# Patient Record
Sex: Female | Born: 1960 | Race: Black or African American | Hispanic: No | Marital: Married | Smoking: Former smoker
Health system: Southern US, Community
[De-identification: ages and names within clinical notes are randomized; demographics above are authoritative.]

## PROBLEM LIST (undated history)

## (undated) DIAGNOSIS — H409 Unspecified glaucoma: Secondary | ICD-10-CM

## (undated) DIAGNOSIS — R0602 Shortness of breath: Secondary | ICD-10-CM

## (undated) DIAGNOSIS — G2581 Restless legs syndrome: Secondary | ICD-10-CM

## (undated) DIAGNOSIS — M1990 Unspecified osteoarthritis, unspecified site: Secondary | ICD-10-CM

## (undated) DIAGNOSIS — M109 Gout, unspecified: Secondary | ICD-10-CM

## (undated) DIAGNOSIS — R51 Headache: Secondary | ICD-10-CM

## (undated) DIAGNOSIS — J449 Chronic obstructive pulmonary disease, unspecified: Secondary | ICD-10-CM

## (undated) DIAGNOSIS — F32A Depression, unspecified: Secondary | ICD-10-CM

## (undated) DIAGNOSIS — J189 Pneumonia, unspecified organism: Secondary | ICD-10-CM

## (undated) DIAGNOSIS — K219 Gastro-esophageal reflux disease without esophagitis: Secondary | ICD-10-CM

## (undated) DIAGNOSIS — G629 Polyneuropathy, unspecified: Secondary | ICD-10-CM

## (undated) DIAGNOSIS — K589 Irritable bowel syndrome without diarrhea: Secondary | ICD-10-CM

## (undated) DIAGNOSIS — I10 Essential (primary) hypertension: Secondary | ICD-10-CM

## (undated) DIAGNOSIS — F419 Anxiety disorder, unspecified: Secondary | ICD-10-CM

## (undated) DIAGNOSIS — G4730 Sleep apnea, unspecified: Secondary | ICD-10-CM

## (undated) DIAGNOSIS — T7840XA Allergy, unspecified, initial encounter: Secondary | ICD-10-CM

## (undated) DIAGNOSIS — T8482XA Fibrosis due to internal orthopedic prosthetic devices, implants and grafts, initial encounter: Secondary | ICD-10-CM

## (undated) DIAGNOSIS — F329 Major depressive disorder, single episode, unspecified: Secondary | ICD-10-CM

## (undated) DIAGNOSIS — E785 Hyperlipidemia, unspecified: Secondary | ICD-10-CM

## (undated) HISTORY — PX: BACK SURGERY: SHX140

## (undated) HISTORY — DX: Irritable bowel syndrome without diarrhea: K58.9

## (undated) HISTORY — PX: HERNIA REPAIR: SHX51

## (undated) HISTORY — PX: COLONOSCOPY: SHX174

## (undated) HISTORY — PX: KNEE ARTHROSCOPY: SUR90

## (undated) HISTORY — DX: Unspecified osteoarthritis, unspecified site: M19.90

## (undated) HISTORY — PX: OTHER SURGICAL HISTORY: SHX169

## (undated) HISTORY — PX: CHOLECYSTECTOMY: SHX55

## (undated) HISTORY — DX: Gastro-esophageal reflux disease without esophagitis: K21.9

## (undated) HISTORY — PX: TUBAL LIGATION: SHX77

## (undated) HISTORY — DX: Allergy, unspecified, initial encounter: T78.40XA

## (undated) HISTORY — DX: Essential (primary) hypertension: I10

## (undated) HISTORY — PX: EYE SURGERY: SHX253

---
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
## 1997-05-11 ENCOUNTER — Ambulatory Visit (HOSPITAL_COMMUNITY): Admission: RE | Admit: 1997-05-11 | Discharge: 1997-05-11 | Payer: Self-pay | Admitting: *Deleted

## 1997-11-07 ENCOUNTER — Inpatient Hospital Stay (HOSPITAL_COMMUNITY): Admission: AD | Admit: 1997-11-07 | Discharge: 1997-11-07 | Payer: Self-pay | Admitting: *Deleted

## 1998-12-11 ENCOUNTER — Ambulatory Visit (HOSPITAL_COMMUNITY): Admission: RE | Admit: 1998-12-11 | Discharge: 1998-12-11 | Payer: Self-pay | Admitting: Cardiology

## 1998-12-11 ENCOUNTER — Encounter: Payer: Self-pay | Admitting: Cardiology

## 1999-04-02 ENCOUNTER — Other Ambulatory Visit: Admission: RE | Admit: 1999-04-02 | Discharge: 1999-04-02 | Payer: Self-pay | Admitting: Obstetrics

## 1999-07-15 ENCOUNTER — Ambulatory Visit (HOSPITAL_COMMUNITY): Admission: RE | Admit: 1999-07-15 | Discharge: 1999-07-15 | Payer: Self-pay | Admitting: Gastroenterology

## 1999-07-15 ENCOUNTER — Encounter: Payer: Self-pay | Admitting: Gastroenterology

## 1999-12-02 ENCOUNTER — Encounter (INDEPENDENT_AMBULATORY_CARE_PROVIDER_SITE_OTHER): Payer: Self-pay

## 1999-12-02 ENCOUNTER — Ambulatory Visit (HOSPITAL_COMMUNITY): Admission: RE | Admit: 1999-12-02 | Discharge: 1999-12-02 | Payer: Self-pay | Admitting: Gastroenterology

## 1999-12-12 ENCOUNTER — Emergency Department (HOSPITAL_COMMUNITY): Admission: EM | Admit: 1999-12-12 | Discharge: 1999-12-12 | Payer: Self-pay | Admitting: Emergency Medicine

## 1999-12-12 ENCOUNTER — Encounter: Payer: Self-pay | Admitting: Emergency Medicine

## 1999-12-12 ENCOUNTER — Ambulatory Visit (HOSPITAL_COMMUNITY): Admission: RE | Admit: 1999-12-12 | Discharge: 1999-12-12 | Payer: Self-pay | Admitting: Cardiology

## 1999-12-12 ENCOUNTER — Encounter: Payer: Self-pay | Admitting: Cardiology

## 1999-12-13 ENCOUNTER — Emergency Department (HOSPITAL_COMMUNITY): Admission: EM | Admit: 1999-12-13 | Discharge: 1999-12-13 | Payer: Self-pay | Admitting: Emergency Medicine

## 1999-12-19 ENCOUNTER — Encounter: Admission: RE | Admit: 1999-12-19 | Discharge: 2000-01-02 | Payer: Self-pay | Admitting: Specialist

## 2000-01-05 ENCOUNTER — Ambulatory Visit (HOSPITAL_COMMUNITY): Admission: RE | Admit: 2000-01-05 | Discharge: 2000-01-05 | Payer: Self-pay | Admitting: Gastroenterology

## 2000-01-05 ENCOUNTER — Encounter: Payer: Self-pay | Admitting: Gastroenterology

## 2000-01-15 ENCOUNTER — Encounter: Admission: RE | Admit: 2000-01-15 | Discharge: 2000-01-15 | Payer: Self-pay | Admitting: Cardiology

## 2000-01-15 ENCOUNTER — Encounter: Payer: Self-pay | Admitting: Cardiology

## 2000-01-27 ENCOUNTER — Encounter: Payer: Self-pay | Admitting: Emergency Medicine

## 2000-01-27 ENCOUNTER — Emergency Department (HOSPITAL_COMMUNITY): Admission: EM | Admit: 2000-01-27 | Discharge: 2000-01-27 | Payer: Self-pay | Admitting: Emergency Medicine

## 2000-02-01 ENCOUNTER — Emergency Department (HOSPITAL_COMMUNITY): Admission: EM | Admit: 2000-02-01 | Discharge: 2000-02-02 | Payer: Self-pay | Admitting: Emergency Medicine

## 2000-02-01 ENCOUNTER — Encounter: Payer: Self-pay | Admitting: Emergency Medicine

## 2000-02-24 ENCOUNTER — Encounter: Admission: RE | Admit: 2000-02-24 | Discharge: 2000-02-24 | Payer: Self-pay | Admitting: Cardiology

## 2000-02-24 ENCOUNTER — Encounter: Payer: Self-pay | Admitting: Cardiology

## 2000-02-25 ENCOUNTER — Emergency Department (HOSPITAL_COMMUNITY): Admission: EM | Admit: 2000-02-25 | Discharge: 2000-02-26 | Payer: Self-pay | Admitting: Emergency Medicine

## 2000-02-26 ENCOUNTER — Encounter: Payer: Self-pay | Admitting: Emergency Medicine

## 2000-03-03 ENCOUNTER — Emergency Department (HOSPITAL_COMMUNITY): Admission: EM | Admit: 2000-03-03 | Discharge: 2000-03-03 | Payer: Self-pay | Admitting: Emergency Medicine

## 2000-03-18 ENCOUNTER — Encounter: Payer: Self-pay | Admitting: Emergency Medicine

## 2000-03-18 ENCOUNTER — Emergency Department (HOSPITAL_COMMUNITY): Admission: EM | Admit: 2000-03-18 | Discharge: 2000-03-18 | Payer: Self-pay | Admitting: Emergency Medicine

## 2000-03-20 ENCOUNTER — Encounter: Payer: Self-pay | Admitting: Neurology

## 2000-03-20 ENCOUNTER — Ambulatory Visit (HOSPITAL_COMMUNITY): Admission: RE | Admit: 2000-03-20 | Discharge: 2000-03-20 | Payer: Self-pay | Admitting: Neurology

## 2000-04-03 ENCOUNTER — Ambulatory Visit (HOSPITAL_COMMUNITY): Admission: RE | Admit: 2000-04-03 | Discharge: 2000-04-03 | Payer: Self-pay | Admitting: Neurology

## 2000-04-03 ENCOUNTER — Encounter: Payer: Self-pay | Admitting: Neurology

## 2000-06-02 ENCOUNTER — Emergency Department (HOSPITAL_COMMUNITY): Admission: EM | Admit: 2000-06-02 | Discharge: 2000-06-02 | Payer: Self-pay | Admitting: Emergency Medicine

## 2000-06-02 ENCOUNTER — Encounter: Payer: Self-pay | Admitting: Emergency Medicine

## 2000-06-14 ENCOUNTER — Encounter: Payer: Self-pay | Admitting: Emergency Medicine

## 2000-06-14 ENCOUNTER — Inpatient Hospital Stay (HOSPITAL_COMMUNITY): Admission: EM | Admit: 2000-06-14 | Discharge: 2000-06-17 | Payer: Self-pay | Admitting: Emergency Medicine

## 2000-06-15 ENCOUNTER — Encounter: Payer: Self-pay | Admitting: Cardiology

## 2000-06-17 ENCOUNTER — Encounter: Payer: Self-pay | Admitting: Cardiology

## 2000-06-22 ENCOUNTER — Emergency Department (HOSPITAL_COMMUNITY): Admission: EM | Admit: 2000-06-22 | Discharge: 2000-06-22 | Payer: Self-pay

## 2000-07-14 ENCOUNTER — Encounter: Payer: Self-pay | Admitting: Cardiology

## 2000-07-14 ENCOUNTER — Ambulatory Visit (HOSPITAL_COMMUNITY): Admission: RE | Admit: 2000-07-14 | Discharge: 2000-07-14 | Payer: Self-pay | Admitting: Cardiology

## 2000-08-10 ENCOUNTER — Emergency Department (HOSPITAL_COMMUNITY): Admission: EM | Admit: 2000-08-10 | Discharge: 2000-08-11 | Payer: Self-pay | Admitting: Emergency Medicine

## 2000-08-11 ENCOUNTER — Encounter: Payer: Self-pay | Admitting: Emergency Medicine

## 2000-08-13 ENCOUNTER — Encounter: Payer: Self-pay | Admitting: Emergency Medicine

## 2000-08-14 ENCOUNTER — Inpatient Hospital Stay (HOSPITAL_COMMUNITY): Admission: EM | Admit: 2000-08-14 | Discharge: 2000-08-16 | Payer: Self-pay | Admitting: Emergency Medicine

## 2000-08-15 ENCOUNTER — Encounter: Payer: Self-pay | Admitting: Pulmonary Disease

## 2000-08-23 ENCOUNTER — Encounter: Payer: Self-pay | Admitting: Emergency Medicine

## 2000-08-23 ENCOUNTER — Emergency Department (HOSPITAL_COMMUNITY): Admission: EM | Admit: 2000-08-23 | Discharge: 2000-08-23 | Payer: Self-pay | Admitting: Emergency Medicine

## 2000-08-24 ENCOUNTER — Encounter: Payer: Self-pay | Admitting: Emergency Medicine

## 2000-08-24 ENCOUNTER — Emergency Department (HOSPITAL_COMMUNITY): Admission: EM | Admit: 2000-08-24 | Discharge: 2000-08-24 | Payer: Self-pay | Admitting: Emergency Medicine

## 2000-08-29 ENCOUNTER — Emergency Department (HOSPITAL_COMMUNITY): Admission: EM | Admit: 2000-08-29 | Discharge: 2000-08-29 | Payer: Self-pay | Admitting: Emergency Medicine

## 2000-12-29 ENCOUNTER — Encounter: Payer: Self-pay | Admitting: Cardiology

## 2000-12-29 ENCOUNTER — Ambulatory Visit (HOSPITAL_COMMUNITY): Admission: RE | Admit: 2000-12-29 | Discharge: 2000-12-29 | Payer: Self-pay | Admitting: Cardiology

## 2001-10-08 ENCOUNTER — Emergency Department (HOSPITAL_COMMUNITY): Admission: EM | Admit: 2001-10-08 | Discharge: 2001-10-08 | Payer: Self-pay | Admitting: Emergency Medicine

## 2001-10-08 ENCOUNTER — Encounter: Payer: Self-pay | Admitting: Emergency Medicine

## 2002-05-01 ENCOUNTER — Emergency Department (HOSPITAL_COMMUNITY): Admission: EM | Admit: 2002-05-01 | Discharge: 2002-05-01 | Payer: Self-pay | Admitting: Emergency Medicine

## 2002-05-01 ENCOUNTER — Encounter: Payer: Self-pay | Admitting: Emergency Medicine

## 2003-02-08 ENCOUNTER — Encounter (HOSPITAL_COMMUNITY): Admission: RE | Admit: 2003-02-08 | Discharge: 2003-05-09 | Payer: Self-pay | Admitting: Cardiology

## 2003-06-08 ENCOUNTER — Emergency Department (HOSPITAL_COMMUNITY): Admission: EM | Admit: 2003-06-08 | Discharge: 2003-06-08 | Payer: Self-pay | Admitting: Emergency Medicine

## 2003-10-31 ENCOUNTER — Encounter: Admission: RE | Admit: 2003-10-31 | Discharge: 2003-10-31 | Payer: Self-pay | Admitting: Cardiology

## 2003-11-15 ENCOUNTER — Encounter: Admission: RE | Admit: 2003-11-15 | Discharge: 2003-11-15 | Payer: Self-pay | Admitting: Cardiology

## 2004-05-24 ENCOUNTER — Emergency Department (HOSPITAL_COMMUNITY): Admission: EM | Admit: 2004-05-24 | Discharge: 2004-05-24 | Payer: Self-pay | Admitting: Emergency Medicine

## 2005-05-12 ENCOUNTER — Encounter: Admission: RE | Admit: 2005-05-12 | Discharge: 2005-05-12 | Payer: Self-pay | Admitting: Pulmonary Disease

## 2006-03-23 ENCOUNTER — Encounter: Admission: RE | Admit: 2006-03-23 | Discharge: 2006-03-23 | Payer: Self-pay | Admitting: Cardiology

## 2006-06-21 ENCOUNTER — Ambulatory Visit (HOSPITAL_COMMUNITY): Admission: RE | Admit: 2006-06-21 | Discharge: 2006-06-21 | Payer: Self-pay | Admitting: Cardiology

## 2006-06-23 ENCOUNTER — Encounter: Admission: RE | Admit: 2006-06-23 | Discharge: 2006-06-23 | Payer: Self-pay | Admitting: Pulmonary Disease

## 2006-10-01 ENCOUNTER — Emergency Department (HOSPITAL_COMMUNITY): Admission: EM | Admit: 2006-10-01 | Discharge: 2006-10-01 | Payer: Self-pay | Admitting: Emergency Medicine

## 2007-04-06 ENCOUNTER — Ambulatory Visit (HOSPITAL_COMMUNITY): Admission: RE | Admit: 2007-04-06 | Discharge: 2007-04-06 | Payer: Self-pay | Admitting: Cardiology

## 2007-05-29 ENCOUNTER — Emergency Department (HOSPITAL_COMMUNITY): Admission: EM | Admit: 2007-05-29 | Discharge: 2007-05-29 | Payer: Self-pay | Admitting: Emergency Medicine

## 2007-06-29 ENCOUNTER — Encounter: Admission: RE | Admit: 2007-06-29 | Discharge: 2007-06-29 | Payer: Self-pay | Admitting: Cardiology

## 2007-08-25 ENCOUNTER — Ambulatory Visit (HOSPITAL_COMMUNITY): Admission: RE | Admit: 2007-08-25 | Discharge: 2007-08-25 | Payer: Self-pay | Admitting: Obstetrics

## 2007-09-28 ENCOUNTER — Encounter (INDEPENDENT_AMBULATORY_CARE_PROVIDER_SITE_OTHER): Payer: Self-pay | Admitting: Obstetrics

## 2007-09-28 ENCOUNTER — Ambulatory Visit (HOSPITAL_COMMUNITY): Admission: RE | Admit: 2007-09-28 | Discharge: 2007-09-28 | Payer: Self-pay | Admitting: Obstetrics

## 2008-01-26 ENCOUNTER — Emergency Department (HOSPITAL_COMMUNITY): Admission: EM | Admit: 2008-01-26 | Discharge: 2008-01-26 | Payer: Self-pay | Admitting: Emergency Medicine

## 2008-05-02 ENCOUNTER — Encounter: Admission: RE | Admit: 2008-05-02 | Discharge: 2008-05-02 | Payer: Self-pay | Admitting: Cardiology

## 2008-06-20 ENCOUNTER — Encounter: Admission: RE | Admit: 2008-06-20 | Discharge: 2008-06-20 | Payer: Self-pay | Admitting: Cardiology

## 2009-06-21 ENCOUNTER — Encounter: Admission: RE | Admit: 2009-06-21 | Discharge: 2009-06-21 | Payer: Self-pay | Admitting: Cardiology

## 2009-12-04 ENCOUNTER — Encounter: Admission: RE | Admit: 2009-12-04 | Discharge: 2009-12-04 | Payer: Self-pay | Admitting: Cardiology

## 2010-02-06 ENCOUNTER — Observation Stay (HOSPITAL_COMMUNITY)
Admission: EM | Admit: 2010-02-06 | Discharge: 2010-02-08 | Payer: Self-pay | Source: Home / Self Care | Attending: Internal Medicine | Admitting: Internal Medicine

## 2010-02-06 LAB — HEPATIC FUNCTION PANEL
ALT: 18 U/L (ref 0–35)
AST: 24 U/L (ref 0–37)
Albumin: 4.8 g/dL (ref 3.5–5.2)
Alkaline Phosphatase: 82 U/L (ref 39–117)
Bilirubin, Direct: 0.1 mg/dL (ref 0.0–0.3)
Indirect Bilirubin: 0.3 mg/dL (ref 0.3–0.9)
Total Bilirubin: 0.4 mg/dL (ref 0.3–1.2)
Total Protein: 8 g/dL (ref 6.0–8.3)

## 2010-02-06 LAB — POCT CARDIAC MARKERS
CKMB, poc: 2.2 ng/mL (ref 1.0–8.0)
Myoglobin, poc: 92.2 ng/mL (ref 12–200)
Troponin i, poc: <0.05 ng/mL (ref 0.00–0.09)

## 2010-02-06 LAB — DIFFERENTIAL
Basophils Absolute: 0 10*3/uL (ref 0.0–0.1)
Basophils Relative: 0 % (ref 0–1)
Eosinophils Absolute: 0.1 10*3/uL (ref 0.0–0.7)
Eosinophils Relative: 1 % (ref 0–5)
Lymphocytes Relative: 48 % — ABNORMAL HIGH (ref 12–46)
Lymphs Abs: 4.4 10*3/uL — ABNORMAL HIGH (ref 0.7–4.0)
Monocytes Absolute: 0.5 10*3/uL (ref 0.1–1.0)
Monocytes Relative: 6 % (ref 3–12)
Neutro Abs: 4.2 10*3/uL (ref 1.7–7.7)
Neutrophils Relative %: 46 % (ref 43–77)

## 2010-02-06 LAB — URINALYSIS, ROUTINE W REFLEX MICROSCOPIC
Bilirubin Urine: NEGATIVE
Hemoglobin, Urine: NEGATIVE
Ketones, ur: NEGATIVE mg/dL
Nitrite: NEGATIVE
Protein, ur: NEGATIVE mg/dL
Specific Gravity, Urine: 1.012 (ref 1.005–1.030)
Urine Glucose, Fasting: NEGATIVE mg/dL
Urobilinogen, UA: 0.2 mg/dL (ref 0.0–1.0)
pH: 6 (ref 5.0–8.0)

## 2010-02-06 LAB — BASIC METABOLIC PANEL
BUN: 6 mg/dL (ref 6–23)
CO2: 28 mEq/L (ref 19–32)
Calcium: 9.8 mg/dL (ref 8.4–10.5)
Chloride: 97 mEq/L (ref 96–112)
Creatinine, Ser: 0.91 mg/dL (ref 0.4–1.2)
GFR calc Af Amer: >60 mL/min (ref >60)
GFR calc non Af Amer: >60 mL/min (ref >60)
Glucose, Bld: 114 mg/dL — ABNORMAL HIGH (ref 70–99)
Potassium: 3.7 mEq/L (ref 3.5–5.1)
Sodium: 134 mEq/L — ABNORMAL LOW (ref 135–145)

## 2010-02-06 LAB — CBC
HCT: 40.2 % (ref 36.0–46.0)
Hemoglobin: 14.1 g/dL (ref 12.0–15.0)
MCH: 28 pg (ref 26.0–34.0)
MCHC: 35.1 g/dL (ref 30.0–36.0)
MCV: 79.9 fL (ref 78.0–100.0)
Platelets: 267 10*3/uL (ref 150–400)
RBC: 5.03 MIL/uL (ref 3.87–5.11)
RDW: 14.3 % (ref 11.5–15.5)
WBC: 9.1 10*3/uL (ref 4.0–10.5)

## 2010-02-06 LAB — LIPASE, BLOOD: Lipase: 24 U/L (ref 11–59)

## 2010-02-07 LAB — COMPREHENSIVE METABOLIC PANEL
ALT: 19 U/L (ref 0–35)
ALT: 17 U/L (ref 0–35)
AST: 24 U/L (ref 0–37)
AST: 27 U/L (ref 0–37)
Albumin: 4.3 g/dL (ref 3.5–5.2)
Albumin: 4.2 g/dL (ref 3.5–5.2)
Alkaline Phosphatase: 82 U/L (ref 39–117)
Alkaline Phosphatase: 73 U/L (ref 39–117)
BUN: 6 mg/dL (ref 6–23)
BUN: 6 mg/dL (ref 6–23)
CO2: 25 mEq/L (ref 19–32)
CO2: 26 mEq/L (ref 19–32)
Calcium: 9.3 mg/dL (ref 8.4–10.5)
Calcium: 9 mg/dL (ref 8.4–10.5)
Chloride: 96 mEq/L (ref 96–112)
Chloride: 98 mEq/L (ref 96–112)
Creatinine, Ser: 0.94 mg/dL (ref 0.4–1.2)
Creatinine, Ser: 0.96 mg/dL (ref 0.4–1.2)
GFR calc Af Amer: >60 mL/min (ref >60)
GFR calc Af Amer: >60 mL/min (ref >60)
GFR calc non Af Amer: >60 mL/min (ref >60)
GFR calc non Af Amer: >60 mL/min (ref >60)
Glucose, Bld: 134 mg/dL — ABNORMAL HIGH (ref 70–99)
Glucose, Bld: 154 mg/dL — ABNORMAL HIGH (ref 70–99)
Potassium: 3.6 mEq/L (ref 3.5–5.1)
Potassium: 3.7 mEq/L (ref 3.5–5.1)
Sodium: 132 mEq/L — ABNORMAL LOW (ref 135–145)
Sodium: 133 mEq/L — ABNORMAL LOW (ref 135–145)
Total Bilirubin: 0.6 mg/dL (ref 0.3–1.2)
Total Bilirubin: 0.5 mg/dL (ref 0.3–1.2)
Total Protein: 7.5 g/dL (ref 6.0–8.3)
Total Protein: 6.5 g/dL (ref 6.0–8.3)

## 2010-02-07 LAB — LIPID PANEL
Cholesterol: 143 mg/dL (ref 0–200)
HDL: 43 mg/dL (ref >39)
LDL Cholesterol: 83 mg/dL (ref 0–99)
Total CHOL/HDL Ratio: 3.3 RATIO
Triglycerides: 87 mg/dL (ref <150)
VLDL: 17 mg/dL (ref 0–40)

## 2010-02-07 LAB — HEMOGLOBIN A1C
Hgb A1c MFr Bld: 6.9 % — ABNORMAL HIGH (ref <5.7)
Mean Plasma Glucose: 151 mg/dL — ABNORMAL HIGH (ref <117)

## 2010-02-07 LAB — TSH: TSH: 4.011 u[IU]/mL (ref 0.350–4.500)

## 2010-02-07 LAB — CARDIAC PANEL(CRET KIN+CKTOT+MB+TROPI)
CK, MB: 3 ng/mL (ref 0.3–4.0)
Relative Index: 0.9 (ref 0.0–2.5)
Total CK: 331 U/L — ABNORMAL HIGH (ref 7–177)
Troponin I: 0.02 ng/mL (ref 0.00–0.06)

## 2010-02-07 LAB — CK TOTAL AND CKMB (NOT AT ARMC)
CK, MB: 3 ng/mL (ref 0.3–4.0)
Relative Index: 0.8 (ref 0.0–2.5)
Total CK: 367 U/L — ABNORMAL HIGH (ref 7–177)

## 2010-02-07 LAB — GLUCOSE, CAPILLARY: Glucose-Capillary: 126 mg/dL — ABNORMAL HIGH (ref 70–99)

## 2010-02-07 LAB — TROPONIN I: Troponin I: 0.01 ng/mL (ref 0.00–0.06)

## 2010-02-07 LAB — PHOSPHORUS: Phosphorus: 3.7 mg/dL (ref 2.3–4.6)

## 2010-02-07 LAB — MAGNESIUM: Magnesium: 2 mg/dL (ref 1.5–2.5)

## 2010-02-14 ENCOUNTER — Encounter (HOSPITAL_COMMUNITY): Admission: RE | Admit: 2010-02-14 | Payer: Self-pay | Source: Home / Self Care | Admitting: Cardiology

## 2010-02-17 LAB — CARDIAC PANEL(CRET KIN+CKTOT+MB+TROPI)
CK, MB: 3.4 ng/mL (ref 0.3–4.0)
Relative Index: 0.9 (ref 0.0–2.5)
Total CK: 392 U/L — ABNORMAL HIGH (ref 7–177)
Troponin I: 0.01 ng/mL (ref 0.00–0.06)

## 2010-02-17 LAB — GLUCOSE, CAPILLARY
Glucose-Capillary: 96 mg/dL (ref 70–99)
Glucose-Capillary: 93 mg/dL (ref 70–99)
Glucose-Capillary: 112 mg/dL — ABNORMAL HIGH (ref 70–99)

## 2010-02-21 NOTE — Discharge Summary (Signed)
  NAMEAHNIKA, HANNIBAL              ACCOUNT NO.:  [id number]  MEDICAL RECORD NO.:  [id number]           PATIENT TYPE:  LOCATION:                                 FACILITY:  PHYSICIAN:  Pincus Large, MD     DATE OF BIRTH:  1960/06/13  DATE OF ADMISSION: DATE OF DISCHARGE:                              DISCHARGE SUMMARY   PRIMARY CARE PHYSICIAN:  Osvaldo Shipper. Spruill, MD  REASON FOR ADMISSION:  Epigastric pain.  HISTORY OF PRESENT ILLNESS:  She is a 50 year old female with past medical history of hypertension, recently diagnosed with diabetes.  She came in because she was sitting on the chair and she had some epigastric right upper quadrant pain radiating to her back with no nausea or vomiting.  The patient came to the emergency room.  Her ultrasound of the abdomen was unremarkable.  The patient also was admitted to rule out acute coronary syndrome.  Her cardiac enzymes x3 were negative.  The patient did feel better with GI cocktail and Protonix.  Her liver function was remarkable.  On the lab, it was found that her A1c was 6.9, so we had diabetic education and dietary consult and we started the patient on metformin 500 p.o. b.i.d.  The patient was sent home on metformin and continue with home medications.  DISCHARGE DIAGNOSES: 1. Gastritis. 2. New onset diabetes type 2. 3. Chest pain, myocardial infarction was ruled out. 4. Hyperglycemia. 5. Chronic obstructive pulmonary disease.  DISCHARGE MEDICATIONS: 1. Protonix 40 daily. 2. Metformin 500 p.o. b.i.d. 3. Advair 500/50 one puff p.o. b.i.d. 4. Combivent inhaler 2 puffs q.4 p.r.n. 5. Lisinopril 40 daily. 6. Naproxen 500 mg q.8 p.r.n. 7. Simvastatin 20 daily.  Radiology that was done here:  Ultrasound of the abdomen shows status post cholecystectomy, otherwise unremarkable.  Chest x-ray shows no acute cardiopulmonary abnormality.  LABORATORY DATA:  On admission, WBC was 9.1, hemoglobin was 14.1, and platelets 267.  Her  sodium was 134, potassium was 3.7, chloride 97, bicarb 28, and glucose 114.  Her A1c was 6.9.  Her fasting lipid profile:  Cholesterol is 143, LDL is 83, and HDL is 43.  Her TSH was 4.01, and her urinalysis was normal.  DISCHARGE VITAL SIGNS:  Temperature is 98, pulse 63, aspirations 20, and blood pressure is 107/67.  DISPOSITION:  Home.  DIET:  Two-gram sodium, 1800 ADA diet.          ______________________________ Pincus Large, MD     SA/MEDQ  D:  02/08/2010  T:  02/09/2010  Job:  [id number]  Electronically Signed by Pincus Large MD on 02/21/2010 11:49:20 AM

## 2010-03-01 NOTE — H&P (Signed)
NAMEKENNEDE, LUSK              ACCOUNT NO.:  [id number]  MEDICAL RECORD NO.:  [id number]           PATIENT TYPE:  LOCATION:                                 FACILITY:  PHYSICIAN:  Michiel Cowboy, MDDATE OF BIRTH:  07/19/1960  DATE OF ADMISSION:  02/07/2010 DATE OF DISCHARGE:                             HISTORY & PHYSICAL   PRIMARY CARE PROVIDER:  The patient identifies her primary care provider is Dr. Shana Chute.  She states that she only sees Dr. Petra Kuba for her asthma, but Dr. Shana Chute states that Dr. Petra Kuba is her primary care provider.  CHIEF COMPLAINT:  Epigastric/chest pain.  HISTORY OF PRESENT ILLNESS:  The patient is a 50 year old female with past history of hypertension, recently diagnosed diabetes by Dr. Shana Chute, and history of asthma, well controlled.  The patient was sitting in her chair after eating breakfast and developed sudden epigastric/right and left upper quadrant discomfort, radiating to her back and may be under her breasts.  She did not have any shortness of breath.  No nausea, no vomiting.  No constipation.  No fevers, no chills.  This has been prolonged for a few hours and eventually she presents to the emergency department.  In the ED, this have now completely resolved.  She has had an ultrasound done which was unremarkable.  Her LFTs were unremarkable.  She is status post cholecystectomy, at which point, [facility] was called to evaluate.  The patient does endorse history of GERD and also history of arthritis.  Otherwise, review of systems is negative.  PAST MEDICAL HISTORY:  Significant for: 1. History of asthma. 2. Newly diagnosed diabetes, not on medications. 3. Hypertension.  SOCIAL HISTORY:  The patient does not smoke or drink or abuse drugs. She used to smoke in remote past.  FAMILY HISTORY:  Noncontributory.  ALLERGIES:  ASPIRIN causes her stomach upset.  MEDICATIONS: 1. Naprosyn 500 mg for back pain. 2.  Ranitidine 150 mg b.i.d. 3. Advair Diskus twice a day. 4. Hydrochlorothiazide 12.5 mg daily. 5. Premarin 0.625 mg daily. 6. Combivent. 7. Flonase. 8. Simvastatin 20 mg daily. 9. Lisinopril 40 mg daily. 10.Albuterol as needed.  PHYSICAL EXAMINATION:  VITAL SIGNS:  Temperature 98.6, blood pressure 138/98, pulse 98, respirations 21, satting 98% on room air. GENERAL:  The patient appears to be in no acute distress. HEENT:  Nontraumatic.  Moist mucous membranes. LUNGS:  Clear to auscultation bilaterally. HEART:  Regular rate and rhythm.  No murmurs appreciated. ABDOMEN:  Soft, nontender, nondistended.  Negative Murphy sign. EXTREMITIES:  Lower extremities without clubbing, cyanosis, or edema. NEUROLOGIC:  Grossly intact.  LABORATORY DATA:  White blood cell count 9.1, hemoglobin 14.1.  Sodium 134, potassium 3.7, creatinine 0.91.  LFTs within normal limits.  Lipase 24.  UA unremarkable.  EKG showing normal sinus rhythm.  No evidence of ischemia.  Abdominal ultrasound also within normal limits.  No chest x- ray was done because pain was initially described more as an epigastric discomfort.  ASSESSMENT AND PLAN: 1. This is a 50 year old female.  She does have history of recently     diagnosed diabetes and hypertension.  The pain was  of unclear     etiology and very atypical, but given her history, we will admit     for further evaluation.  She was not completely clear if it was     just epigastric or there is some radiation to the chest.  We will     admit, we will cycle cardiac enzymes, check fasting lipid panel,     hemoglobin A1c.  The patient would probably benefit from diabetes     education.  We will do serial EKGs. 2. Hypertension.  Continue lisinopril. 3. Prophylaxis.  We will write for Protonix and SCDs. 4. Epigastric pain, etiology unclear.  May be gastric in nature.  If     continues, consider attempting to give Carafate or GI cocktail to     make sure she is on Protonix.   She is currently chest pain and     epigastric pain free.     Michiel Cowboy, MD     AVD/MEDQ  D:  02/07/2010  T:  02/07/2010  Job:  [id number]  cc:   Mina Marble, M.D.  Electronically Signed by Therisa Doyne MD on 03/01/2010 06:16:08 AM

## 2010-03-05 ENCOUNTER — Encounter: Payer: Self-pay | Admitting: Cardiology

## 2010-03-24 ENCOUNTER — Encounter: Payer: Medicaid Other | Admitting: Dietician

## 2010-03-24 ENCOUNTER — Encounter: Payer: Medicaid Other | Attending: Pulmonary Disease

## 2010-03-24 DIAGNOSIS — Z713 Dietary counseling and surveillance: Secondary | ICD-10-CM | POA: Insufficient documentation

## 2010-03-24 DIAGNOSIS — IMO0001 Reserved for inherently not codable concepts without codable children: Secondary | ICD-10-CM | POA: Insufficient documentation

## 2010-04-01 ENCOUNTER — Other Ambulatory Visit (HOSPITAL_COMMUNITY): Payer: Self-pay | Admitting: Obstetrics

## 2010-04-01 DIAGNOSIS — N950 Postmenopausal bleeding: Secondary | ICD-10-CM

## 2010-04-11 ENCOUNTER — Ambulatory Visit (HOSPITAL_COMMUNITY)
Admission: RE | Admit: 2010-04-11 | Discharge: 2010-04-11 | Disposition: A | Discharge status: Home / Self Care | Payer: Medicaid Other | Source: Ambulatory Visit | Attending: Obstetrics | Admitting: Obstetrics

## 2010-04-11 DIAGNOSIS — N950 Postmenopausal bleeding: Secondary | ICD-10-CM | POA: Insufficient documentation

## 2010-04-11 DIAGNOSIS — N83209 Unspecified ovarian cyst, unspecified side: Secondary | ICD-10-CM | POA: Insufficient documentation

## 2010-04-16 ENCOUNTER — Other Ambulatory Visit (HOSPITAL_COMMUNITY): Payer: Self-pay | Admitting: Obstetrics

## 2010-04-16 DIAGNOSIS — R58 Hemorrhage, not elsewhere classified: Secondary | ICD-10-CM

## 2010-04-17 ENCOUNTER — Encounter: Payer: Medicaid Other | Attending: Pulmonary Disease

## 2010-04-17 DIAGNOSIS — Z713 Dietary counseling and surveillance: Secondary | ICD-10-CM | POA: Insufficient documentation

## 2010-04-17 DIAGNOSIS — IMO0001 Reserved for inherently not codable concepts without codable children: Secondary | ICD-10-CM | POA: Insufficient documentation

## 2010-04-18 ENCOUNTER — Ambulatory Visit (HOSPITAL_COMMUNITY): Admission: RE | Admit: 2010-04-18 | Payer: Medicaid Other | Source: Ambulatory Visit

## 2010-05-16 ENCOUNTER — Encounter (HOSPITAL_COMMUNITY)
Admission: RE | Admit: 2010-05-16 | Discharge: 2010-05-16 | Disposition: A | Discharge status: Home / Self Care | Payer: Medicaid Other | Source: Ambulatory Visit | Attending: Obstetrics | Admitting: Obstetrics

## 2010-05-16 LAB — BASIC METABOLIC PANEL
BUN: 9 mg/dL (ref 6–23)
CO2: 28 mEq/L (ref 19–32)
Calcium: 9.2 mg/dL (ref 8.4–10.5)
Chloride: 106 mEq/L (ref 96–112)
Creatinine, Ser: 0.8 mg/dL (ref 0.4–1.2)
GFR calc Af Amer: >60 mL/min (ref >60)
GFR calc non Af Amer: >60 mL/min (ref >60)
Glucose, Bld: 98 mg/dL (ref 70–99)
Potassium: 3.7 mEq/L (ref 3.5–5.1)
Sodium: 141 mEq/L (ref 135–145)

## 2010-05-16 LAB — CBC
HCT: 35.1 % — ABNORMAL LOW (ref 36.0–46.0)
Hemoglobin: 11.6 g/dL — ABNORMAL LOW (ref 12.0–15.0)
MCH: 27.6 pg (ref 26.0–34.0)
MCHC: 33 g/dL (ref 30.0–36.0)
MCV: 83.4 fL (ref 78.0–100.0)
Platelets: 252 10*3/uL (ref 150–400)
RBC: 4.21 MIL/uL (ref 3.87–5.11)
RDW: 14.7 % (ref 11.5–15.5)
WBC: 6.8 10*3/uL (ref 4.0–10.5)

## 2010-05-20 ENCOUNTER — Other Ambulatory Visit: Payer: Self-pay | Admitting: Pulmonary Disease

## 2010-05-20 DIAGNOSIS — Z1231 Encounter for screening mammogram for malignant neoplasm of breast: Secondary | ICD-10-CM

## 2010-05-28 ENCOUNTER — Ambulatory Visit (HOSPITAL_COMMUNITY)
Admission: RE | Admit: 2010-05-28 | Discharge: 2010-05-29 | Disposition: A | Discharge status: Home / Self Care | Payer: Medicaid Other | Source: Ambulatory Visit | Attending: Obstetrics | Admitting: Obstetrics

## 2010-05-28 ENCOUNTER — Other Ambulatory Visit: Payer: Self-pay | Admitting: Obstetrics

## 2010-05-28 DIAGNOSIS — N938 Other specified abnormal uterine and vaginal bleeding: Secondary | ICD-10-CM | POA: Insufficient documentation

## 2010-05-28 DIAGNOSIS — Z01812 Encounter for preprocedural laboratory examination: Secondary | ICD-10-CM | POA: Insufficient documentation

## 2010-05-28 DIAGNOSIS — N949 Unspecified condition associated with female genital organs and menstrual cycle: Secondary | ICD-10-CM | POA: Insufficient documentation

## 2010-05-28 DIAGNOSIS — Z01818 Encounter for other preprocedural examination: Secondary | ICD-10-CM | POA: Insufficient documentation

## 2010-05-28 LAB — GLUCOSE, CAPILLARY
Glucose-Capillary: 89 mg/dL (ref 70–99)
Glucose-Capillary: 126 mg/dL — ABNORMAL HIGH (ref 70–99)
Glucose-Capillary: 142 mg/dL — ABNORMAL HIGH (ref 70–99)

## 2010-05-29 LAB — CBC
HCT: 33.7 % — ABNORMAL LOW (ref 36.0–46.0)
Hemoglobin: 11.2 g/dL — ABNORMAL LOW (ref 12.0–15.0)
MCH: 27.5 pg (ref 26.0–34.0)
MCHC: 33.2 g/dL (ref 30.0–36.0)
MCV: 82.8 fL (ref 78.0–100.0)
Platelets: 213 10*3/uL (ref 150–400)
RBC: 4.07 MIL/uL (ref 3.87–5.11)
RDW: 14 % (ref 11.5–15.5)
WBC: 8.1 10*3/uL (ref 4.0–10.5)

## 2010-06-04 NOTE — Discharge Summary (Signed)
  [name], Stacy [name]              ACCOUNT NO.:  [id number]  MEDICAL RECORD NO.:  [id number]          PATIENT TYPE:  LOCATION:                                 FACILITY:  PHYSICIAN:  Kathreen Cosier, M.D.DATE OF BIRTH:  July 13, 1960  DATE OF ADMISSION: DATE OF DISCHARGE:  05/29/2010                              DISCHARGE SUMMARY   The patient is a 50 year old gravida 2, para 2-0-0-2, who was admitted with abnormal bleeding and had an endometrial biopsy 2 weeks ago, which was normal.  She was in for hysteroscopy, D and C, HTA on April 25. During the procedure after the pressures could not be equalize with HTA. The device was removed, and on insertion of a second device, the uterus was perforated and the patient was brought in for observation. Overnight, she did well.  Her abdomen was soft, nontender.  She had minimal vaginal bleeding and was discharged on May 29, 2010.  Her medications were unchanged.  She was discharged on Premarin, Protonix, metformin, fluticasone nasal spray, Combivent, Advair, hydrochlorothiazide, lisinopril, naproxen, Proventil, ranitidine, and simvastatin as she had been taking at home previously without any change in the dosage of her medication.  She is to see me in 4 weeks.  DISCHARGE DIAGNOSIS:  Status post hysteroscopy, dilation and curettage, and perforation of the uterine fundus.          ______________________________ Kathreen Cosier, M.D.     BAM/MEDQ  D:  05/29/2010  T:  05/29/2010  Job:  [id number]  Electronically Signed by Francoise Ceo M.D. on 06/04/2010 08:13:49 AM

## 2010-06-04 NOTE — Op Note (Signed)
  Stacy [name], KUMAGAI              ACCOUNT NO.:  [id number]  MEDICAL RECORD NO.:  [id number]           PATIENT TYPE:  O  LOCATION:  9316                          FACILITY:  [facility]  PHYSICIAN:  Kathreen Cosier, M.D.DATE OF BIRTH:  12-21-1960  DATE OF PROCEDURE:  05/28/2010 DATE OF DISCHARGE:                              OPERATIVE REPORT   SURGEON:  Kathreen Cosier, MD  PREOPERATIVE DIAGNOSIS:  Dysfunctional bleeding.  POSTOPERATIVE DIAGNOSIS:  Dysfunctional bleeding.  PROCEDURE:  Hysteroscopy, D and C, and attempted ThermaChoice ablation, which was not done.  Under general anesthesia, the patient was in lithotomy position. Perineum and vagina were prepped and draped.  Bladder was emptied with straight catheter.  Bimanual exam revealed uterus to be normal size, negative adnexa.  Speculum was placed in the vagina.  Cervix was injected with 10 mL of 1% Xylocaine.  Anterior lip of the cervix was grasped with tenaculum.  The endometrial cavity sounded 10 cm.  Cervix was dilated with #25 Shawnie Pons and the hysteroscope inserted.  It was noted that there was a myoma in the cavity and the fundus could not be visualized; however, there was an adequate space between the fibroid and the wall of the uterus to sound the uterus.  The hysteroscope was removed and sharp curettage was performed.  Moderate amount of tissue was obtained.  The ThermaChoice device was then inserted to a depth of 10 cm, which the cavity had measured before; however, the pressure could not be stabilized after multiple attempts.  So, the device was removed and a new device was inserted.  The new device did not stop at 10 cm meaning that the fundus was perforated, so all procedures were terminated at that point and the patient was admitted for observation.          ______________________________ Kathreen Cosier, M.D.     BAM/MEDQ  D:  05/28/2010  T:  05/29/2010  Job:  [id number]  Electronically Signed by  Francoise Ceo M.D. on 06/04/2010 08:13:47 AM

## 2010-06-17 NOTE — Op Note (Signed)
[name], Stacy [name]              ACCOUNT NO.:  [id number]   MEDICAL RECORD NO.:  [id number]          PATIENT TYPE:  AMB   LOCATION:  SDC                           FACILITY:  [facility]   PHYSICIAN:  Kathreen Cosier, M.D.DATE OF BIRTH:  [birth year]   DATE OF PROCEDURE:  09/28/2007  DATE OF DISCHARGE:                               OPERATIVE REPORT   PREOPERATIVE DIAGNOSIS:  Dysfunctional uterine bleeding.   POSTOPERATIVE DIAGNOSIS:  Dysfunctional uterine bleeding.   PROCEDURE:  Hysteroscopy and dilatation and curettage.   Under general anesthesia,  the patient in lithotomy position.  Perineum  and vagina were prepped and draped.  Bladder emptied with a straight  catheter.  Bimanual exam revealed the uterus to be enlarged with myomas.  Speculum was placed in the vagina.  Cervix was injected with 10 mL of 1%  Xylocaine.  Anterior lip of the cervix grasped with a tenaculum and the  cervix curetted and small amount of tissue obtained.  Endometrial cavity  sounded at 9 cm.  Cervix was dilated with #27 Shawnie Pons, and then the  hysteroscope inserted.  The cavity was normal except for a polyp at 6  o'clock.  The hysteroscope was removed and sharp curettage performed.  A  large amount of tissue was obtained.  Forceps inserted and polyp  removed.   The patient tolerated the procedure well and was taken to the recovery  room in good condition.           ______________________________  Kathreen Cosier, M.D.     BAM/MEDQ  D:  09/28/2007  T:  09/28/2007  Job:  [id number]

## 2010-06-20 NOTE — Procedures (Signed)
[facility]  Patient:    Stacy [name], Stacy [name]                       MRN: [id number] Proc. Date: 12/02/99 Adm. Date:  [id number] Attending:  Louie Bun CC:         Osvaldo Shipper. Spruill, M.D.   Procedure Report  PROCEDURE:  Esophagogastroduodenoscopy with biopsy.  SURGEON:  John C. Madilyn Fireman, M.D.  INDICATIONS FOR PROCEDURE:  Chronic abdominal pain, extensively worked up in the past, but without recent study of her upper GI tract, and recent failure to respond to Nexium.  She has also had diarrhea and the procedure is with the intent of obtaining small bowel biopsies to rule out a sprue-like lesion as well as to assess for the presence of acid peptic disease.  DESCRIPTION OF PROCEDURE:  The patient was placed in the left lateral decubitus position and placed on the pulse monitor with continuous low flow oxygen delivered by nasal cannula.  She was sedated with 100 mg of IV Demerol and 10 mg of IV Versed.  The Olympus video endoscope was advanced under direct vision into the oropharynx and esophagus.  The esophagus was straight and of normal caliber at the squamocolumnar line at 38 cm.  There was no visible esophagitis, ring, stricture, or other abnormalities of the GE junction.  The stomach was entered and a small amount of liquid secretions were suctioned from the fundus.  Retroflexed view of the cardia was unremarkable.  The fundus and body appeared normal.  The antrum showed two or three patchy erosions without exudate, as well as some general erythema and mild granularity distally consistent with a mild antral gastritis.  The pylorus was ______ and appeared patent, but it was difficult to pass the scope through it requiring the scope to be pushed to its full insertion length, possibly consistent with an elongated or J-shaped stomach.  Once the duodenum was entered, the bulb and second portion appeared to be within normal limits.  Small bowel biopsies  were taken to rule out celiac disease or other forms of disease of the intestinal villi.  The scope was then withdrawn and the patient returned to the recovery room in stable condition.  She tolerated the procedure well and there were no immediate complications.  IMPRESSION:  Mild antral gastritis, otherwise normal endoscopy.  PLAN:  Await CLOtest and small bowel biopsies. DD:  12/02/99 TD:  12/02/99 Job: [id number]

## 2010-06-20 NOTE — Discharge Summary (Signed)
[place]. [facility]  Patient:    Stacy [name], Stacy [name] Visit Number: [id number] MRN: [id number]          Service Type: MED Location: 3W 0365 01 Attending Physician:  Robynn Pane Admit Date:  06/14/2000 Discharge Date: 06/17/2000   CC:         Osvaldo Shipper. Spruill, M.D.                           Discharge Summary  ADMISSION DIAGNOSES: 1. Atypical chest pain, rule out coronary insufficiency. 2. Exacerbation of asthmatic bronchitis. 3. History of tobacco abuse. 4. History of gastritis. 5. History of irritable bowel syndrome.  FINAL DIAGNOSES: 1. Status post exacerbation of asthmatic bronchitis. 2. Status post atypical chest pain. 3. History of tobacco abuse. 4. History of irritable bowel syndrome. 5. Chronic constipation. 6. Chronic gastritis.  DISCHARGE HOME MEDICATIONS: 1. Tequin 400 mg 1 tablet daily for five more days. 2. Nexium 40 mg 1 capsule daily. 3. Colace 100 mg 1 daily as needed. 4. Proventil inhaler 2 puffs twice daily. 5. Atrovent inhaler 2 puffs twice daily. 6. Baby aspirin 81 mg 2 tablets daily. 7. Cardizem CD 120 mg 1 capsule daily.  ACTIVITY:  As tolerated.  DIET:  Low salt, low cholesterol.  DISCHARGE INSTRUCTIONS:  The patient has been advised to stop smoking.  FOLLOW-UP:  With Dr. Shana Chute in two weeks, and GI as scheduled.  CONDITION ON DISCHARGE:  Stable.  HISTORY OF PRESENT ILLNESS:  Stacy [name] is a 50 year old black female with past medical history significant for bronchial asthma, irritable bowel syndrome, history of gastritis, came to ER via EMS complaining of pleuritic chest pain associated with cough and shortness of breath off and on since 1 p.m. this afternoon.  The pain got worse this evening so decided to call EMS.  Denies fevers, chills, rigors.  States was seen in ER at [facility] on Jun 02, 2000, with similar complaints and was treated with steroids and antibiotics and was discharged home with some  improvement.  She states she feels weak and tired with minimal exertion.  Denies any PND, orthopnea, leg swelling.  Denies palpitations, lightheadedness, or syncope.  PAST MEDICAL HISTORY:  As above.  PAST SURGICAL HISTORY: 1. Cholecystectomy many years ago. 2. Ventral hernia repair.  SOCIAL HISTORY:  She is single, has two children.  Smoked one-half pack per day for 20+ years.  States has not smoked for the last two weeks.  No history of alcohol abuse or drug abuse.  Did housework.  Is on disability at present.  FAMILY HISTORY:  One sister died of massive MI in her 19s.  One brother hypertensive.  Father and mother, she does not know about her parents.  MEDICATIONS: 1. Valium 5 mg t.i.d. 2. Neurontin. 3. Celebrex. 4. Nexium. 5. Proventil and Atrovent inhalers.  PHYSICAL EXAMINATION:  GENERAL:  The patient is alert, awake, oriented x 3.  Complaining of mild pleuritic and chest wall pain.  VITAL SIGNS:  Blood pressure 130/70, pulse was 96.  HEENT:  Conjunctivae pink.  NECK:  Supple.  No JVD, no bruit.  No lymph nodes.  LUNGS:  Bilateral rhonchi with no wheezing, no rub.  CARDIOVASCULAR:  S1, S2 normal.  There was no S3, gallop.  There was no rub. There was soft systolic murmur at the apex.  ABDOMEN:  Soft.  Bowel sounds are present.  Distended.  There was no mass or organomegaly.  Nontender.  EXTREMITIES:  There was no clubbing, cyanosis, or edema.  LABORATORY DATA:  EKG done in the ER showed sinus tachycardia.  Left atrial enlargement with heart rate of 125.  No acute ischemic changes were noted. Repeat EKG done on Jun 15, 2000, showed normal sinus rhythm with heart rate of 77.  Chest x-ray showed accentuated basilar markings.  There was no evidence of pneumonia.  Heart and vascularity remain normal.  KUB showed persistent retained feces throughout the colon but improvement from prior KUB which was done on Jun 14, 2000.  Cholesterol was 194, LDL of 105, HDL of  61.  CPK first set was 406, MB of 4.9, with relative index of 1.2.  Second set CPK 258, MB of 1.9, relative index 0.7.  Third set 210 CPK, MB of 2.4, relative index 1.1.  Three sets of troponin I were 0.05, 0.04, 0.01.  Essentially her cardiac enzymes were negative.  Magnesium was 2.2, sodium 139, potassium 3.6, chloride 103, bicarbonate 28.  Blood glucose was 172, repeat fasting sugar was 97.  BUN was 5, creatinine 0.8.  Hemoglobin was 12.2, hematocrit 35.8, white count of 12.4. Repeat hemoglobin on Jun 16, 2000, was 12, hemoglobin 35.3, white count has come down to 8.4, with no shift to the left.  HOSPITAL COURSE:  The patient was admitted to telemetry unit.  MI was ruled out by serial enzymes and EKG.  The patient did not have any episodes of further chest pain.  The patient was started on IV Tequin, which was switched to p.o.  The patient remained afebrile during the hospital stay.  The patient was continued on Atrovent and Proventil nebulizers.  The patient remained afebrile.  Had a good bowel movement on Jun 15, 2000, and has received Fleet enema today again for chronic constipation.  The patient has been advised to stop her Valium and continue on Colace as needed.  The patient has been advised to follow up with GI as scheduled.  Follow up with Dr. Shana Chute in two weeks. Attending Physician:  Robynn Pane DD:  06/17/00 TD:  06/19/00 Job: [id number]

## 2010-06-20 NOTE — Discharge Summary (Signed)
[place]. [facility]  Patient:    Stacy [name], Stacy [name] Visit Number: [id number] MRN: [id number]          Service Type: EMS Location: MINO Attending Physician:  Lorre Nick Dictated by:   Eino Farber., M.D. Adm. Date:  08/29/2000 Disc. Date: 08/29/2000                             Discharge Summary  DISCHARGE DIAGNOSES: 1. Chronic obstructive pulmonary disease with status asthmaticus. 2. Pneumonia, nonspecific. 3. Subsegmental atelectasis with pulmonary collapse at the bases. 4. Gastroesophageal reflux disease. 5. Hypokalemia. 6. Esophageal dyskinesis. 7. Sprain of left knee. 8. Fall, nonspecific, with straining of the right knee with the accident to    being within institution.  BRIEF HISTORY:  The patient is a 50 year old black female with a known history of asthma, who was admitted from the emergency department with uncontrolled status asthmaticus with a persistent tachycardia.  The patient takes inhaled steroids, as well as albuterol nebulizers without control of her asthma.  IV Solu-Medrol steroid was given in the emergency room, still without control o f her asthmatic condition and she was admitted for further care.  The patient has other problems of gastroesophageal reflux disease, which at times may be a component of her asthmatic condition.  PERTINENT PHYSICAL FINDINGS ON ADMISSION:  The patient had a temperature of 97 degrees, a pulse of 120, respirations 24, and blood pressure 121/67.  GENERAL APPEARANCE:  The patient was observed to be a well-developed, well-nourished, black female, oriented x 3 with frequent cough and moderate audible wheezing.  LUNGS:  The lungs revealed moderate bilateral sibilant rhonchi.  HEART:  Regular rhythm with a rate that was initially 120, but did gradually improve in the 90 beats per minute range.  ABDOMEN:  Mild epigastric tenderness.  EXTREMITIES:  Good strength.  No edema on  admission.  LABORATORY DATA:  The chest x-ray revealed mild infiltrates at the bases.  Chemistry evaluation showed a low potassium of 3.1 mEq/L.  HOSPITAL COURSE:  The patient was given IV Solu-Medrol initially in the range of 125 mg IV q.6h., which was tapered rapidly during her hospital course and discontinued.  The patient also was placed on Reglan 10 mg p.o. a.c. meals at h.s., as well as Protonix 40 mg p.o. q.d. for gastroesophageal reflux disease. She was placed on K-Dur 20 mEq p.o. b.i.d. for hypokalemia.  The patients condition gradually improved and she was discharged on August 16, 2000.  DISCHARGE MEDICATIONS: 1. Albuterol nebulizers 2.5 mg solution q.i.d. as needed for chest tightness    not controlled by her albuterol metered dose inhaler. 2. Flovent 110 two puffs q.a.m. and q.h.s. until improved and then two puffs    q.a.m. 3. Reglan 10 mg p.o. a.c. meals and  h.s. 4. Ultram 50 mg one t.i.d. for musculoskeletal pain. 5. Diazepam 5 mg p.o. q.a.m. and q.h.s. for muscle tension.  FOLLOW-UP:  The patient is to have a follow-up in the office in August of 2002 for further care.  The patient will be further evaluated at that time for additional adjustments in medications as needed. Dictated by:   Eino Farber., M.D. Attending Physician:  Lorre Nick DD:  09/30/00 TD:  09/30/00 Job: [id number]

## 2010-06-23 ENCOUNTER — Ambulatory Visit
Admission: RE | Admit: 2010-06-23 | Discharge: 2010-06-23 | Disposition: A | Discharge status: Home / Self Care | Payer: Medicaid Other | Source: Ambulatory Visit | Attending: Pulmonary Disease | Admitting: Pulmonary Disease

## 2010-06-23 DIAGNOSIS — Z1231 Encounter for screening mammogram for malignant neoplasm of breast: Secondary | ICD-10-CM

## 2010-07-29 ENCOUNTER — Encounter: Payer: Medicaid Other | Attending: Pulmonary Disease | Admitting: Dietician

## 2010-07-29 ENCOUNTER — Ambulatory Visit: Payer: Medicaid Other | Admitting: Dietician

## 2010-09-19 ENCOUNTER — Other Ambulatory Visit: Payer: Self-pay

## 2010-09-29 ENCOUNTER — Encounter: Payer: Self-pay | Admitting: Dietician

## 2010-09-29 ENCOUNTER — Encounter: Payer: Medicaid Other | Attending: Pulmonary Disease | Admitting: Dietician

## 2010-09-29 NOTE — Progress Notes (Signed)
.   Medical Nutrition Therapy:  Appt start time: 1500 end time:  1530.   Assessment:  Primary concerns today: Loss of energy and diarrhea.  Notes that her blood glucose levels have been doing well.  Checking Fasting levels (93-104-112-147) Generally 95-120 mg.   Continues with the Januvia and Metformin ER 750 mg.  Has been having diarrhea last 7-10 days.  Has a history of IBS and she feels this may be part of the diarrhea issue.  She continues to have vaginal bleeding and is scheduled for an ablation on September 5 th. She notes she really would prefer to have a hysterectomy and not have the bleeding issues.  Her surgeon feels that at age 50 she might want to have more children. She says that this in definitely not the case.  We reviewed interventions for the diarrhea and nutrient approaches to increasing iron.    MEDICATIONS: Reviewed meds.   If she continues to have diarrhea, she needs to consult with Dr,. Margaretmary Bayley regarding omitting the metformin for a few days.   DIETARY INTAKE:  Has been very limited for the last 2 weeks.  May need to go with more frequent meals in smaller amounts, and to use bland foods.  Progress Towards Goal(s):  In progress.   Nutritional Diagnosis:  -2.1 Inpaired nutrition utilization As related to glucose.  As evidenced by episodes of increased blood glucose and the diagnosis of type 2 diabetes..    Intervention:  Nutrition See the patient d/c instructions for strategies for diarrhea and low iron..  Handouts given during visit include:   Dietary sources for Iron  Food label for review.  Monitoring/Evaluation:  Dietary intake, exercise, blood glucose levels , and body weight early in November, to call to set-up the appointment.

## 2010-09-29 NOTE — Patient Instructions (Addendum)
Call Seward Grater [phone]) the results of your HgA1C when you know the results.  Look to increase your intake of protein foods (meats, chicken, use of dried beans, peas, like pinto beans, navy beans).  Look to add the vitamin C sources with the meats and the dried beans.  Try some soup, foods that are softer and less likely to irritate the intestinal tract.  Creamed potatoes, boil the potato, add touch of margarine, some pepper, limit the fat (Margarine)  Aim for bland, not a lot of spice, pepper, not a lot fat  Small serving of plain rice, 1. Combine 1/2 cup of dry rice, add 1 cup of water and 1 tablespoon of margarine. 2. Leave the cover off the pot and bring to a boil.  3 When boiling, put lid on and turn to lowest setting on the stove eye.  4. Let steam for 25-30 minutes.  Another measure would be 1 cup of rice  And 2 cups of water.  Another measure would be 2 cup of rice.  Add 4 cups of water.  (Have a big pot to use)  Watch the amount of the serving of the starchy rice and potatoes.  They will raise your blood sugar.  But for now 1/2 cup every 2 hours could help retrain your intestine.  If you continue to have diarrhea, you might call Dr. Margaretmary Bayley about the  Metformin.

## 2010-10-01 ENCOUNTER — Encounter (HOSPITAL_COMMUNITY)
Admission: RE | Admit: 2010-10-01 | Discharge: 2010-10-01 | Disposition: A | Discharge status: Home / Self Care | Payer: Medicaid Other | Source: Ambulatory Visit | Attending: Obstetrics | Admitting: Obstetrics

## 2010-10-01 ENCOUNTER — Other Ambulatory Visit: Payer: Self-pay

## 2010-10-01 ENCOUNTER — Encounter (HOSPITAL_COMMUNITY): Payer: Self-pay

## 2010-10-01 HISTORY — DX: Hyperlipidemia, unspecified: E78.5

## 2010-10-01 HISTORY — DX: Chronic obstructive pulmonary disease, unspecified: J44.9

## 2010-10-01 HISTORY — DX: Headache: R51

## 2010-10-01 HISTORY — DX: Shortness of breath: R06.02

## 2010-10-01 LAB — CBC
HCT: 37.6 % (ref 36.0–46.0)
Hemoglobin: 13 g/dL (ref 12.0–15.0)
MCH: 27.4 pg (ref 26.0–34.0)
MCHC: 34.6 g/dL (ref 30.0–36.0)
MCV: 79.3 fL (ref 78.0–100.0)
Platelets: 246 10*3/uL (ref 150–400)
RBC: 4.74 MIL/uL (ref 3.87–5.11)
RDW: 13.9 % (ref 11.5–15.5)
WBC: 6.2 10*3/uL (ref 4.0–10.5)

## 2010-10-01 LAB — BASIC METABOLIC PANEL
BUN: 8 mg/dL (ref 6–23)
CO2: 27 mEq/L (ref 19–32)
Calcium: 9.6 mg/dL (ref 8.4–10.5)
Chloride: 95 mEq/L — ABNORMAL LOW (ref 96–112)
Creatinine, Ser: 0.69 mg/dL (ref 0.50–1.10)
GFR calc Af Amer: >60 mL/min (ref >60)
GFR calc non Af Amer: >60 mL/min (ref >60)
Glucose, Bld: 91 mg/dL (ref 70–99)
Potassium: 3.8 mEq/L (ref 3.5–5.1)
Sodium: 130 mEq/L — ABNORMAL LOW (ref 135–145)

## 2010-10-01 NOTE — Anesthesia Preprocedure Evaluation (Addendum)
Anesthesia Evaluation  Name, MR# and DOB Patient awake  General Assessment Comment  Reviewed: Allergy & Precautions, H&P , Patient's Chart, lab work & pertinent test results, reviewed documented beta blocker date and time   Airway Mallampati: II TM Distance: >3 FB Neck ROM: full    Dental No notable dental hx.    Pulmonary  asthma COPD inhaler  clear to auscultation  pulmonary exam normalPulmonary Exam Normal breath sounds clear to auscultation none    Cardiovascular hypertension, regular Normal    Neuro/Psych   GI/Hepatic/Renal   Endo/Other  (+) Diabetes mellitus-, Type 2,     Abdominal   Musculoskeletal   Hematology   Peds  Reproductive/Obstetrics    Anesthesia Other Findings            Anesthesia Physical Anesthesia Plan  ASA: III  Anesthesia Plan: General   Post-op Pain Management:    Induction: Intravenous  Airway Management Planned: LMA  Additional Equipment:   Intra-op Plan:   Post-operative Plan:   Informed Consent: I have reviewed the patients History and Physical, chart, labs and discussed the procedure including the risks, benefits and alternatives for the proposed anesthesia with the patient or authorized representative who has indicated his/her understanding and acceptance.   Dental Advisory Given  Plan Discussed with: CRNA and Surgeon  Anesthesia Plan Comments: (  Discussed  general anesthesia, including possible nausea, instrumentation of airway, sore throat,pulmonary aspiration, etc. I asked if the were any outstanding questions, or  concerns before we proceeded. )        Anesthesia Quick Evaluation

## 2010-10-01 NOTE — Patient Instructions (Signed)
20 Margarett Viti  10/01/2010   Your procedure is scheduled on:  10/08/2010  Report to [facility] at 600 AM.  Call this number if you have problems the morning of surgery: [phone]   Remember:   Do not eat food:After Midnight.  Do not drink clear liquids: After Midnight.  Take these medicines the morning of surgery with A SIP OF WATER: all meds per anesthesia,    Do not wear jewelry, make-up or nail polish.  Do not wear lotions, powders, or perfumes. You may wear deodorant.  Do not shave 48 hours prior to surgery.  Do not bring valuables to the hospital.  Contacts, dentures or bridgework may not be worn into surgery.  Leave suitcase in the car. After surgery it may be brought to your room.  For patients admitted to the hospital, checkout time is 11:00 AM the day of discharge.   Patients discharged the day of surgery will not be allowed to drive home.  Name and phone number of your driver: [facility] dept of social services transportation services  Special Instructions: CHG Shower Use Special Wash: 1/2 bottle night before surgery and 1/2 bottle morning of surgery.   Please read over the following fact sheets that you were given: Care and Recovery After Surgery

## 2010-10-04 HISTORY — PX: ENDOMETRIAL ABLATION: SHX621

## 2010-10-07 NOTE — H&P (Signed)
NAMEHULDAH, MARIN              ACCOUNT NO.:  [id number]  MEDICAL RECORD NO.:  [id number]  LOCATION:                                 FACILITY:  PHYSICIAN:  Kathreen Cosier, M.D.DATE OF BIRTH:  [birth year]  DATE OF ADMISSION: DATE OF DISCHARGE:                             HISTORY & PHYSICAL   DATE OF OPERATION:  October 08, 2010.  The patient is a 50 year old gravida 2, para 2-0-0-2, who has a history of myomas and also had some dysfunctional uterine bleeding.  On May 28, 2010, she underwent a ThermaChoice and D and C and hysteroscopy.  At that time, it was noted she had myomas in the cavity and she had a D and C which showed atrophic endometrium and polyp and she had a ThermaChoice attempted.  This resulted in the perforation of her uterus.  The patient still has bleeding and she is now for HTA.  She has a history of diabetes, arthritis, and increased blood pressure.  She takes metformin 750 mg daily.  She takes Protonix, lisinopril, and Naprosyn.  PAST SURGICAL HISTORY:  As described above.  SURGICAL HISTORY:  Negative.  PHYSICAL EXAMINATION:  GENERAL:  A well-developed female in no distress. HEENT:  Negative. LUNGS:  Clear. HEART:  Regular rhythm.  No murmurs, no gallops. BREASTS:  No masses. ABDOMEN:  Negative. GENITALIA:  Bimanual exam, uterus enlarged with myomas and negative adnexa.  Vagina negative.  Cervix, Pap smear was negative.  External genitalia normal. EXTREMITIES:  Negative.          ______________________________ Kathreen Cosier, M.D.     BAM/MEDQ  D:  10/07/2010  T:  10/07/2010  Job:  [id number]

## 2010-10-08 ENCOUNTER — Encounter (HOSPITAL_COMMUNITY): Payer: Self-pay | Admitting: Anesthesiology

## 2010-10-08 ENCOUNTER — Encounter (HOSPITAL_COMMUNITY)
Admission: RE | Disposition: A | Discharge status: Home / Self Care | Payer: Self-pay | Source: Ambulatory Visit | Attending: Obstetrics

## 2010-10-08 ENCOUNTER — Ambulatory Visit (HOSPITAL_COMMUNITY): Payer: Medicaid Other | Admitting: Anesthesiology

## 2010-10-08 ENCOUNTER — Ambulatory Visit (HOSPITAL_COMMUNITY)
Admission: RE | Admit: 2010-10-08 | Discharge: 2010-10-08 | Disposition: A | Discharge status: Home / Self Care | Payer: Medicaid Other | Source: Ambulatory Visit | Attending: Obstetrics | Admitting: Obstetrics

## 2010-10-08 DIAGNOSIS — N938 Other specified abnormal uterine and vaginal bleeding: Secondary | ICD-10-CM

## 2010-10-08 DIAGNOSIS — Z01818 Encounter for other preprocedural examination: Secondary | ICD-10-CM | POA: Insufficient documentation

## 2010-10-08 DIAGNOSIS — Z01812 Encounter for preprocedural laboratory examination: Secondary | ICD-10-CM | POA: Insufficient documentation

## 2010-10-08 DIAGNOSIS — N949 Unspecified condition associated with female genital organs and menstrual cycle: Secondary | ICD-10-CM | POA: Insufficient documentation

## 2010-10-08 LAB — PREGNANCY, URINE: Preg Test, Ur: NEGATIVE

## 2010-10-08 LAB — GLUCOSE, CAPILLARY: Glucose-Capillary: 87 mg/dL (ref 70–99)

## 2010-10-08 SURGERY — DILATATION & CURETTAGE/HYSTEROSCOPY WITH HYDROTHERMAL ABLATION
Anesthesia: General | Wound class: Clean Contaminated

## 2010-10-08 MED ORDER — MIDAZOLAM HCL 5 MG/ML IJ SOLN
INTRAMUSCULAR | Status: DC | PRN
  Administered 2010-10-08: 2 mg via INTRAVENOUS

## 2010-10-08 MED ORDER — PROPOFOL 10 MG/ML IV EMUL
INTRAVENOUS | Status: AC
  Filled 2010-10-08: qty 20

## 2010-10-08 MED ORDER — PROPOFOL 10 MG/ML IV EMUL
INTRAVENOUS | Status: DC | PRN
  Administered 2010-10-08: 160 mg via INTRAVENOUS

## 2010-10-08 MED ORDER — SODIUM CHLORIDE 0.9 % IR SOLN
Status: DC | PRN
  Administered 2010-10-08: 3000 mL

## 2010-10-08 MED ORDER — DEXAMETHASONE SODIUM PHOSPHATE 10 MG/ML IJ SOLN: INTRAMUSCULAR | Status: DC | PRN

## 2010-10-08 MED ORDER — ALBUTEROL SULFATE HFA 108 (90 BASE) MCG/ACT IN AERS
INHALATION_SPRAY | RESPIRATORY_TRACT | Status: AC
  Filled 2010-10-08: qty 6.7

## 2010-10-08 MED ORDER — EPHEDRINE 5 MG/ML INJ
INTRAVENOUS | Status: AC
  Filled 2010-10-08: qty 10

## 2010-10-08 MED ORDER — LIDOCAINE HCL (CARDIAC) 20 MG/ML IV SOLN
INTRAVENOUS | Status: DC | PRN
  Administered 2010-10-08: 60 mg via INTRAVENOUS

## 2010-10-08 MED ORDER — EPHEDRINE SULFATE 50 MG/ML IJ SOLN
INTRAMUSCULAR | Status: DC | PRN
  Administered 2010-10-08: 10 mg via INTRAVENOUS

## 2010-10-08 MED ORDER — ACETAMINOPHEN-CODEINE #3 300-30 MG PO TABS
ORAL_TABLET | ORAL | Status: AC
  Administered 2010-10-08: 1 via ORAL
  Filled 2010-10-08: qty 1

## 2010-10-08 MED ORDER — ONDANSETRON HCL 4 MG/2ML IJ SOLN
INTRAMUSCULAR | Status: DC | PRN
  Administered 2010-10-08: 4 mg via INTRAVENOUS

## 2010-10-08 MED ORDER — DEXAMETHASONE SODIUM PHOSPHATE 10 MG/ML IJ SOLN
INTRAMUSCULAR | Status: AC
  Filled 2010-10-08: qty 1

## 2010-10-08 MED ORDER — LACTATED RINGERS IV SOLN
INTRAVENOUS | Status: DC
  Administered 2010-10-08 (×2): via INTRAVENOUS

## 2010-10-08 MED ORDER — FENTANYL CITRATE 0.05 MG/ML IJ SOLN
INTRAMUSCULAR | Status: AC
  Administered 2010-10-08: 50 ug via INTRAVENOUS
  Filled 2010-10-08: qty 2

## 2010-10-08 MED ORDER — MIDAZOLAM HCL 2 MG/2ML IJ SOLN
INTRAMUSCULAR | Status: AC
  Filled 2010-10-08: qty 2

## 2010-10-08 MED ORDER — KETOROLAC TROMETHAMINE 30 MG/ML IJ SOLN
INTRAMUSCULAR | Status: DC | PRN
  Administered 2010-10-08: 30 mg via INTRAVENOUS

## 2010-10-08 MED ORDER — ONDANSETRON HCL 4 MG/2ML IJ SOLN
INTRAMUSCULAR | Status: AC
  Filled 2010-10-08: qty 2

## 2010-10-08 MED ORDER — MIDAZOLAM HCL 5 MG/5ML IJ SOLN
INTRAMUSCULAR | Status: DC | PRN
  Administered 2010-10-08: 2 mg via INTRAVENOUS

## 2010-10-08 MED ORDER — FENTANYL CITRATE 0.05 MG/ML IJ SOLN
INTRAMUSCULAR | Status: AC
  Filled 2010-10-08: qty 2

## 2010-10-08 MED ORDER — LIDOCAINE HCL (CARDIAC) 20 MG/ML IV SOLN
INTRAVENOUS | Status: AC
  Filled 2010-10-08: qty 5

## 2010-10-08 MED ORDER — FENTANYL CITRATE 0.05 MG/ML IJ SOLN
25.0000 ug | INTRAMUSCULAR | Status: DC | PRN
  Administered 2010-10-08 (×2): 50 ug via INTRAVENOUS

## 2010-10-08 MED ORDER — LIDOCAINE HCL 1 % IJ SOLN
INTRAMUSCULAR | Status: DC | PRN
  Administered 2010-10-08: 10 mL via INTRADERMAL

## 2010-10-08 MED ORDER — FENTANYL CITRATE 0.05 MG/ML IJ SOLN
INTRAMUSCULAR | Status: DC | PRN
  Administered 2010-10-08: 100 ug via INTRAVENOUS

## 2010-10-08 MED ORDER — ACETAMINOPHEN-CODEINE #3 300-30 MG PO TABS
2.0000 | ORAL_TABLET | ORAL | Status: AC | PRN
  Administered 2010-10-08: 1 via ORAL

## 2010-10-08 SURGICAL SUPPLY — 12 items
CATH ROBINSON RED A/P 16FR (CATHETERS) ×2 IMPLANT
CLOTH BEACON ORANGE TIMEOUT ST (SAFETY) ×2 IMPLANT
CONTAINER PREFILL 10% NBF 60ML (FORM) ×4 IMPLANT
DRAPE CAMERA CLOSED 9X96 (DRAPES) ×2 IMPLANT
DRAPE HYSTEROSCOPY (DRAPE) ×2 IMPLANT
DRAPE UTILITY XL STRL (DRAPES) ×2 IMPLANT
GLOVE BIO SURGEON STRL SZ8.5 (GLOVE) ×4 IMPLANT
GOWN PREVENTION PLUS LG XLONG (DISPOSABLE) ×2 IMPLANT
GOWN PREVENTION PLUS XXLARGE (GOWN DISPOSABLE) ×2 IMPLANT
PACK VAGINAL MINOR WOMEN LF (CUSTOM PROCEDURE TRAY) ×2 IMPLANT
SET GENESYS HTA PROCERVA (MISCELLANEOUS) ×2 IMPLANT
TOWEL OR 17X24 6PK STRL BLUE (TOWEL DISPOSABLE) ×4 IMPLANT

## 2010-10-08 NOTE — Anesthesia Postprocedure Evaluation (Signed)
  Anesthesia Post-op Note  Patient: Stacy [name]  Procedure(s) Performed:  DILATATION & CURETTAGE/HYSTEROSCOPY WITH HYDROTHERMAL ABLATION  Patient is awake and responsive. Pain and nausea are reasonably well controlled. Vital signs are stable and clinically acceptable. Oxygen saturation is clinically acceptable. There are no apparent anesthetic complications at this time. Patient is ready for discharge.

## 2010-10-08 NOTE — Transfer of Care (Signed)
Immediate Anesthesia Transfer of Care Note  Patient: Stacy [name]  Procedure(s) Performed:  DILATATION & CURETTAGE/HYSTEROSCOPY WITH HYDROTHERMAL ABLATION  Patient Location: PACU  Anesthesia Type: General  Level of Consciousness: awake, alert , oriented and patient cooperative  Airway & Oxygen Therapy: Patient Spontanous Breathing and Patient connected to nasal cannula oxygen  Post-op Assessment: Report given to PACU RN and Post -op Vital signs reviewed and stable  Post vital signs: Reviewed and stable  Complications: No apparent anesthesia complications

## 2010-10-08 NOTE — Anesthesia Procedure Notes (Signed)
Procedure Name: LMA Insertion Date/Time: 10/08/2010 7:28 AM Performed by: Cephus Shelling Pre-anesthesia Checklist: Patient identified, Patient being monitored, Emergency Drugs available, Suction available and Timeout performed Patient Re-evaluated:Patient Re-evaluated prior to inductionOxygen Delivery Method: Circle System Utilized Preoxygenation: Pre-oxygenation with 100% oxygen Intubation Type: IV induction Ventilation: Mask ventilation without difficulty LMA: LMA inserted LMA Size: 4.0 Number of attempts: 1 Placement Confirmation: positive ETCO2 and breath sounds checked- equal and bilateral Dental Injury: Teeth and Oropharynx as per pre-operative assessment

## 2010-10-08 NOTE — Addendum Note (Signed)
Addendum  created 10/08/10 0932 by Dorthey Depace   Modules edited:Anesthesia Flowsheet    

## 2010-10-08 NOTE — Addendum Note (Signed)
Addendum  created 10/08/10 0932 by Cephus Shelling   Modules edited:Anesthesia Flowsheet

## 2010-10-08 NOTE — Op Note (Signed)
in and in in preop diagnosis dysfunctional uterine bleeding Postoperative diagnosis the same Surgeon Dr. Francoise Ceo Procedure HTA Anesthesia Gen. Procedure on the on the general anesthesia patient in the lithotomy position perineum and vagina prepped and draped bladder emptied with a straight catheter bimanual exam revealed the uterus to be enlarged myomatous Speculum placed in the vagina cervix injected with 10 cc of 1% Xylocaine Anterior lip of the cervix grasped with tenaculum The cavity endometrial cavity sounded 10 cm Cervix dilated to #25 Shawnie Pons and the hysteroscope inserted and is noticed to have been myoma on the left side of the uterus HTA procedure was done 2 minutes won't 10 minutes ablation 2 minutes: A Patient tolerated procedure well taken to recovery in good condition end of dictation dictated by Dr. Gaynell Face and 512 thank you and and and and and

## 2010-10-28 LAB — DIFFERENTIAL
Basophils Absolute: 0
Basophils Relative: 0
Eosinophils Absolute: 0
Eosinophils Relative: 0
Lymphocytes Relative: 45
Lymphs Abs: 2.9
Monocytes Absolute: 0.3
Monocytes Relative: 6
Neutro Abs: 3.1
Neutrophils Relative %: 49

## 2010-10-28 LAB — COMPREHENSIVE METABOLIC PANEL
ALT: 13
AST: 18
Albumin: 3.7
Alkaline Phosphatase: 74
BUN: 6
CO2: 23
Calcium: 9.6
Chloride: 108
Creatinine, Ser: 0.69
GFR calc Af Amer: >60
GFR calc non Af Amer: >60
Glucose, Bld: 111 — ABNORMAL HIGH
Potassium: 3.4 — ABNORMAL LOW
Sodium: 138
Total Bilirubin: 0.5
Total Protein: 7.1

## 2010-10-28 LAB — URINALYSIS, ROUTINE W REFLEX MICROSCOPIC
Bilirubin Urine: NEGATIVE
Glucose, UA: NEGATIVE
Ketones, ur: NEGATIVE
Nitrite: NEGATIVE
Protein, ur: NEGATIVE
Specific Gravity, Urine: 1.002 — ABNORMAL LOW
Urobilinogen, UA: 0.2
pH: 6.5

## 2010-10-28 LAB — URINE MICROSCOPIC-ADD ON

## 2010-10-28 LAB — CBC
HCT: 37
Hemoglobin: 12.6
MCHC: 34.1
MCV: 83.7
Platelets: 246
RBC: 4.42
RDW: 14.4
WBC: 6.3

## 2010-10-28 LAB — LIPASE, BLOOD: Lipase: 28

## 2010-10-28 LAB — URINE CULTURE: Colony Count: >=100000

## 2010-11-19 ENCOUNTER — Encounter: Payer: Self-pay | Admitting: Dietician

## 2010-11-19 ENCOUNTER — Encounter: Payer: Medicaid Other | Attending: Pulmonary Disease | Admitting: Dietician

## 2010-11-19 DIAGNOSIS — K589 Irritable bowel syndrome without diarrhea: Secondary | ICD-10-CM

## 2010-11-19 DIAGNOSIS — Z713 Dietary counseling and surveillance: Secondary | ICD-10-CM | POA: Insufficient documentation

## 2010-11-19 DIAGNOSIS — E119 Type 2 diabetes mellitus without complications: Secondary | ICD-10-CM | POA: Insufficient documentation

## 2010-11-19 HISTORY — DX: Irritable bowel syndrome, unspecified: K58.9

## 2010-11-19 NOTE — Patient Instructions (Addendum)
   Keep checking your blood sugar one time each day.     Alternate times of testing. Maybe 3 days per week check at other times;  One day before lunch.  Another day check before dinner.  Another day, check before bedtime.Toddy Boyd change up and check 2 hours after the first bite of your largest meal of the day. Use the American Diabetes Association's Recommendation for glucose levels.  Keep up that walking as many days per week as possible.  Explore getting a medical alert identification bracelet at [employer].  They Davinity Fanara cost about $ 12.00.  You Vivek Grealish want to get some Glucose Tablets in the diabetes section of a drug store of [employer].  Talk to Dr. Petra Kuba about getting a prescription for 2 months rather than 1 month to save going to [facility].  Cell number  [phone].  Call the [phone] for follow in January for 30 minutes.    Call Maggie the A1C results [phone].

## 2010-11-19 NOTE — Progress Notes (Signed)
  Medical Nutrition Therapy:  Appt start time: 1230 end time:  1300.  Assessment:  Primary concerns today: Concerned to day regarding the difficulty getting test strips.  Having a problem finding a mail order company that will fill her strips since she is [place] Medicaid.  Currently, I am not aware of any company that will honor [place] Medicaid.  She is concerned that it will be a long, cold walk to [facility] for her strips as the winter progresses.  Currently monitoring 1 time per day.  Today, she encountered a gentleman wearing a lot of perfume.  Caused her to become SOB and voice was much more hoarse.  Recovered after a few minutes.   Has visits scheduled for her physicians later in the month.  Will have an HgA1C done at that time and will call the results to me.    BLOOD GLUCOSE:  Fasting levels:87, 103, 112, 110, 100, 99, 119, 106, 98, 133, 120, 97 112, 114, 100, 142, 108, 108, 96, 125, 105 118, 112, 104, 113, 99, 94, 100, 113, 108, 111, 89, 80, 94, 102, 108, 89.   MEDICATIONS:  See review of medications.  DIETARY INTAKE:  24-hr recall:  B (6:00-8:00 AM): Toast 1 slice regular, grits 1/2 cup, and 1 strip of bacon,  +/- an egg.    L ( PM): If she eats breakfast, then she does not eat lunch.  Will drink water and eat low salt carackers if she gets hungry.  Snk ( PM): Continues with water and the crackers.  Sometimes uses peanut butter.  I'm not sure of the number of crackers. D (5:00 PM): Liver, turnip greens and rice.  Drank water.  Beverages: Drinks a great deal of water 90+ ounces each day  Recent physical activity: Walks to the office to volunteer and around the neighborhood for exercise, if the weather is good.  Estimated energy needs: Adjusted weight= 139 lb (63 kg) 1400-1500 calories 150-155 g carbohydrates 100-105 g protein 37-39 g fat  Progress Towards Goal(s):  In progress.   Nutritional Diagnosis:  [place]-2.1 Inpaired nutrition utilization As related to glucose.  As evidenced  by diagnosis of diabetes type 2 and elevated HgA1C.    Intervention:  Nutrition: Limiting her portion sizes and using a grazing approach to eating during the day.  She has gained 3.2 lb since her last visit on  07/29/2010. I am having a problem determining exactly what she is eating and the carb intake.  She continues to have issues with the irritable bowel syndrome and this affects her intake.   Handouts given during visit include:  ADA recommendations for self-care, especially the blood glucose levels   Blood glucose log  Glucose tablets Lot: [id number]  Monitoring/Evaluation:  Dietary intake, exercise, blood glucose levels, and body weight in three months.

## 2011-01-08 ENCOUNTER — Encounter (HOSPITAL_COMMUNITY): Payer: Self-pay | Admitting: *Deleted

## 2011-01-08 ENCOUNTER — Inpatient Hospital Stay (HOSPITAL_COMMUNITY)
Admission: AD | Admit: 2011-01-08 | Discharge: 2011-01-09 | Disposition: A | Discharge status: Home / Self Care | Payer: Medicaid Other | Source: Ambulatory Visit | Attending: Obstetrics | Admitting: Obstetrics

## 2011-01-08 DIAGNOSIS — N949 Unspecified condition associated with female genital organs and menstrual cycle: Secondary | ICD-10-CM | POA: Insufficient documentation

## 2011-01-08 DIAGNOSIS — N390 Urinary tract infection, site not specified: Secondary | ICD-10-CM | POA: Insufficient documentation

## 2011-01-08 DIAGNOSIS — A499 Bacterial infection, unspecified: Secondary | ICD-10-CM | POA: Insufficient documentation

## 2011-01-08 DIAGNOSIS — N760 Acute vaginitis: Secondary | ICD-10-CM | POA: Insufficient documentation

## 2011-01-08 DIAGNOSIS — B9689 Other specified bacterial agents as the cause of diseases classified elsewhere: Secondary | ICD-10-CM | POA: Insufficient documentation

## 2011-01-08 DIAGNOSIS — N938 Other specified abnormal uterine and vaginal bleeding: Secondary | ICD-10-CM | POA: Insufficient documentation

## 2011-01-08 LAB — CBC
HCT: 36.3 % (ref 36.0–46.0)
Hemoglobin: 12.5 g/dL (ref 12.0–15.0)
MCH: 27.9 pg (ref 26.0–34.0)
MCHC: 34.4 g/dL (ref 30.0–36.0)
MCV: 81 fL (ref 78.0–100.0)
Platelets: 231 10*3/uL (ref 150–400)
RBC: 4.48 MIL/uL (ref 3.87–5.11)
RDW: 14.4 % (ref 11.5–15.5)
WBC: 11.7 10*3/uL — ABNORMAL HIGH (ref 4.0–10.5)

## 2011-01-08 MED ORDER — KETOROLAC TROMETHAMINE 60 MG/2ML IM SOLN
60.0000 mg | Freq: Once | INTRAMUSCULAR | Status: AC
  Administered 2011-01-09: 60 mg via INTRAMUSCULAR
  Filled 2011-01-08: qty 2

## 2011-01-08 NOTE — Progress Notes (Signed)
Pt reports vaginal bleeding since this morning and urinary urgency, frequency, and dysuria

## 2011-01-08 NOTE — ED Provider Notes (Signed)
History     Chief Complaint  Patient presents with  . Vaginal Bleeding  . Abdominal Pain   HPI 50 y.o. female. Frequent urination starting tonight, + dysuria, abdominal pain after urination, vaginal bleeding started yesterday, + cramping. Had ablation on 10/08/10, no bleeding until now.     Past Medical History  Diagnosis Date  . Hypertension     Pt on lisinopril  . GERD (gastroesophageal reflux disease)     Pt on Protonix daily  . Arthritis     Takes Naprosyn   . Diabetes mellitus     Takes Metformin 750 mg  . Hyperlipidemia   . Asthma   . COPD (chronic obstructive pulmonary disease)   . Shortness of breath     occasional - uses breathing tx at home  . Headache     otc meds prn  . Irritable bowel syndrome 11/19/2010    Past Surgical History  Procedure Date  . Hernia repair   . Cholecystectomy   . Svd      x 2  . Tubal ligation   . Endometrial ablation 10/2010    No family history on file.  History  Substance Use Topics  . Smoking status: Former Smoker -- 1.0 packs/day for 10 years    Types: Cigarettes    Quit date: 09/28/2000  . Smokeless tobacco: Never Used  . Alcohol Use: No    Allergies:  Allergies  Allergen Reactions  . Other Shortness Of Breath    Allergic to perfumes and cleaning products  . Aspirin     unknown    Prescriptions prior to admission  Medication Sig Dispense Refill  . albuterol (PROVENTIL) (5 MG/ML) 0.5% nebulizer solution Take 2.5 mg by nebulization every 4 (four) hours as needed. asthma       . estrogens, conjugated, (PREMARIN) 0.625 MG tablet Take 0.625 mg by mouth daily. Take daily for 21 days then do not take for 7 days.       . fluticasone (VERAMYST) 27.5 MCG/SPRAY nasal spray Place 1 spray into the nose daily.        . Fluticasone-Salmeterol (ADVAIR) 500-50 MCG/DOSE AEPB Inhale 1 puff into the lungs every 12 (twelve) hours.        . hydrochlorothiazide (,MICROZIDE/HYDRODIURIL,) 12.5 MG capsule Take 12.5 mg by mouth daily.         Marland Kitchen ipratropium (ATROVENT) 0.02 % nebulizer solution Take 500 mcg by nebulization every 4 (four) hours as needed. asthma       . lisinopril (PRINIVIL,ZESTRIL) 40 MG tablet Take 40 mg by mouth daily.        . metFORMIN (GLUCOPHAGE-XR) 750 MG 24 hr tablet Take 750 mg by mouth daily with breakfast.        . naproxen (NAPROSYN) 500 MG tablet Take 500 mg by mouth 2 (two) times daily with a meal.        . pantoprazole (PROTONIX) 40 MG tablet Take 40 mg by mouth daily.        . simvastatin (ZOCOR) 20 MG tablet Take 20 mg by mouth at bedtime.        . sitaGLIPtin (JANUVIA) 100 MG tablet Take 100 mg by mouth daily.          Review of Systems  Constitutional: Negative.   Respiratory: Negative.   Cardiovascular: Negative.   Gastrointestinal: Positive for abdominal pain. Negative for nausea, vomiting, diarrhea and constipation.  Genitourinary: Positive for dysuria, urgency and frequency. Negative for hematuria and flank pain.  Positive for vaginal bleeding   Musculoskeletal: Negative.   Neurological: Negative.   Psychiatric/Behavioral: Negative.    Physical Exam   Blood pressure 115/82, pulse 83, temperature 98.8 F (37.1 C), temperature source Oral, resp. rate 16, weight 155 lb (70.308 kg).  Physical Exam  Nursing note and vitals reviewed. Constitutional: She is oriented to person, place, and time. She appears well-developed and well-nourished. She appears distressed.  Cardiovascular: Normal rate.   Respiratory: Effort normal.  GI: Soft. There is tenderness (suprapubic).  Genitourinary: There is no tenderness or lesion on the right labia. There is no tenderness or lesion on the left labia. Uterus is not enlarged and not tender. Cervix exhibits no motion tenderness. Right adnexum displays no mass, no tenderness and no fullness. Left adnexum displays no mass, no tenderness and no fullness. No bleeding around the vagina. Vaginal discharge (malodorous, white) found.  Musculoskeletal: Normal  range of motion.  Neurological: She is alert and oriented to person, place, and time.  Skin: Skin is warm and dry.    MAU Course  Procedures  Results for orders placed during the hospital encounter of 01/08/11 (from the past 24 hour(s))  URINALYSIS, ROUTINE W REFLEX MICROSCOPIC     Status: Abnormal   Collection Time   01/08/11 11:15 PM      Component Value Range   Color, Urine YELLOW  YELLOW    APPearance CLEAR  CLEAR    Specific Gravity, Urine 1.010  1.005 - 1.030    pH 5.5  5.0 - 8.0    Glucose, UA NEGATIVE  NEGATIVE (mg/dL)   Hgb urine dipstick LARGE (*) NEGATIVE    Bilirubin Urine NEGATIVE  NEGATIVE    Ketones, ur NEGATIVE  NEGATIVE (mg/dL)   Protein, ur NEGATIVE  NEGATIVE (mg/dL)   Urobilinogen, UA 0.2  0.0 - 1.0 (mg/dL)   Nitrite POSITIVE (*) NEGATIVE    Leukocytes, UA LARGE (*) NEGATIVE   URINE MICROSCOPIC-ADD ON     Status: Abnormal   Collection Time   01/08/11 11:15 PM      Component Value Range   Squamous Epithelial / LPF FEW (*) RARE    WBC, UA TOO NUMEROUS TO COUNT  <3 (WBC/hpf)   RBC / HPF TOO NUMEROUS TO COUNT  <3 (RBC/hpf)   Bacteria, UA MANY (*) RARE    Urine-Other FEW YEAST    CBC     Status: Abnormal   Collection Time   01/08/11 11:25 PM      Component Value Range   WBC 11.7 (*) 4.0 - 10.5 (K/uL)   RBC 4.48  3.87 - 5.11 (MIL/uL)   Hemoglobin 12.5  12.0 - 15.0 (g/dL)   HCT 04.5  40.9 - 81.1 (%)   MCV 81.0  78.0 - 100.0 (fL)   MCH 27.9  26.0 - 34.0 (pg)   MCHC 34.4  30.0 - 36.0 (g/dL)   RDW 91.4  78.2 - 95.6 (%)   Platelets 231  150 - 400 (K/uL)  WET PREP, GENITAL     Status: Abnormal   Collection Time   01/09/11 12:03 AM      Component Value Range   Yeast, Wet Prep NONE SEEN  NONE SEEN    Trich, Wet Prep NONE SEEN  NONE SEEN    Clue Cells, Wet Prep MANY (*) NONE SEEN    WBC, Wet Prep HPF POC FEW (*) NONE SEEN     Pain improved with Toradol Assessment and Plan  50 y.o. female with UTI and BV  Rx Flagyl, Cipro and Pyridium F/U  PRN  Divon Krabill 01/08/2011, 11:40 PM

## 2011-01-08 NOTE — Progress Notes (Signed)
Pt had an ablation 10/2010, tonight started having lower abd cramping and bleeding x 2 hrs.  Frequency and pain with urination since 1700.

## 2011-01-09 LAB — URINALYSIS, ROUTINE W REFLEX MICROSCOPIC
Bilirubin Urine: NEGATIVE
Glucose, UA: NEGATIVE mg/dL
Ketones, ur: NEGATIVE mg/dL
Nitrite: POSITIVE — AB
Protein, ur: NEGATIVE mg/dL
Specific Gravity, Urine: 1.01 (ref 1.005–1.030)
Urobilinogen, UA: 0.2 mg/dL (ref 0.0–1.0)
pH: 5.5 (ref 5.0–8.0)

## 2011-01-09 LAB — URINE MICROSCOPIC-ADD ON

## 2011-01-09 LAB — WET PREP, GENITAL
Trich, Wet Prep: NONE SEEN
Yeast Wet Prep HPF POC: NONE SEEN

## 2011-01-09 LAB — GC/CHLAMYDIA PROBE AMP, GENITAL
Chlamydia, DNA Probe: NEGATIVE
GC Probe Amp, Genital: NEGATIVE

## 2011-01-09 MED ORDER — PHENAZOPYRIDINE HCL 200 MG PO TABS: 200.0000 mg | ORAL_TABLET | Freq: Three times a day (TID) | ORAL | Status: AC | PRN

## 2011-01-09 MED ORDER — METRONIDAZOLE 500 MG PO TABS: 500.0000 mg | ORAL_TABLET | Freq: Two times a day (BID) | ORAL | Status: AC

## 2011-01-09 MED ORDER — CIPROFLOXACIN HCL 500 MG PO TABS: 500.0000 mg | ORAL_TABLET | Freq: Two times a day (BID) | ORAL | Status: AC

## 2011-02-18 ENCOUNTER — Ambulatory Visit: Payer: Medicaid Other | Admitting: Dietician

## 2011-02-22 ENCOUNTER — Emergency Department (HOSPITAL_COMMUNITY): Payer: Medicaid Other

## 2011-02-22 ENCOUNTER — Emergency Department (HOSPITAL_COMMUNITY)
Admission: EM | Admit: 2011-02-22 | Discharge: 2011-02-22 | Disposition: A | Discharge status: Home / Self Care | Payer: Medicaid Other | Attending: Emergency Medicine | Admitting: Emergency Medicine

## 2011-02-22 DIAGNOSIS — J4489 Other specified chronic obstructive pulmonary disease: Secondary | ICD-10-CM | POA: Insufficient documentation

## 2011-02-22 DIAGNOSIS — E119 Type 2 diabetes mellitus without complications: Secondary | ICD-10-CM | POA: Insufficient documentation

## 2011-02-22 DIAGNOSIS — M129 Arthropathy, unspecified: Secondary | ICD-10-CM | POA: Insufficient documentation

## 2011-02-22 DIAGNOSIS — M549 Dorsalgia, unspecified: Secondary | ICD-10-CM

## 2011-02-22 DIAGNOSIS — M545 Low back pain, unspecified: Secondary | ICD-10-CM | POA: Insufficient documentation

## 2011-02-22 DIAGNOSIS — Z79899 Other long term (current) drug therapy: Secondary | ICD-10-CM | POA: Insufficient documentation

## 2011-02-22 DIAGNOSIS — K589 Irritable bowel syndrome without diarrhea: Secondary | ICD-10-CM | POA: Insufficient documentation

## 2011-02-22 DIAGNOSIS — W108XXA Fall (on) (from) other stairs and steps, initial encounter: Secondary | ICD-10-CM | POA: Insufficient documentation

## 2011-02-22 DIAGNOSIS — W19XXXA Unspecified fall, initial encounter: Secondary | ICD-10-CM

## 2011-02-22 DIAGNOSIS — J449 Chronic obstructive pulmonary disease, unspecified: Secondary | ICD-10-CM | POA: Insufficient documentation

## 2011-02-22 DIAGNOSIS — E785 Hyperlipidemia, unspecified: Secondary | ICD-10-CM | POA: Insufficient documentation

## 2011-02-22 DIAGNOSIS — I10 Essential (primary) hypertension: Secondary | ICD-10-CM | POA: Insufficient documentation

## 2011-02-22 DIAGNOSIS — R269 Unspecified abnormalities of gait and mobility: Secondary | ICD-10-CM | POA: Insufficient documentation

## 2011-02-22 DIAGNOSIS — K219 Gastro-esophageal reflux disease without esophagitis: Secondary | ICD-10-CM | POA: Insufficient documentation

## 2011-02-22 MED ORDER — OXYCODONE-ACETAMINOPHEN 5-325 MG PO TABS
1.0000 | ORAL_TABLET | Freq: Once | ORAL | Status: AC
  Administered 2011-02-22: 1 via ORAL
  Filled 2011-02-22: qty 1

## 2011-02-22 MED ORDER — OXYCODONE-ACETAMINOPHEN 5-325 MG PO TABS: 1.0000 | ORAL_TABLET | Freq: Once | ORAL | Status: DC

## 2011-02-22 MED ORDER — IBUPROFEN 800 MG PO TABS: 800.0000 mg | ORAL_TABLET | Freq: Three times a day (TID) | ORAL | Status: AC | PRN

## 2011-02-22 NOTE — ED Provider Notes (Signed)
Medical screening examination/treatment/procedure(s) were performed by non-physician practitioner and as supervising physician I was immediately available for consultation/collaboration.   Rolan Bucco, MD 02/22/11 [phone]

## 2011-02-22 NOTE — ED Notes (Signed)
Patient at home this morning, slipped going down the stairs approx #6 stairs. Denies hitting head, no LOC. Painful coccyx. Denies numbness radiating down legs

## 2011-02-22 NOTE — ED Provider Notes (Signed)
History     CSN: [id number]  Arrival date & time 02/22/11  1036   First MD Initiated Contact with Patient 02/22/11 1056      Chief Complaint  Patient presents with  . Fall  . Tailbone Pain    (Consider location/radiation/quality/duration/timing/severity/associated sxs/prior treatment) HPI Comments:  patient presents emergency Department with a chief complaint of lower back pain.  She states she fell down the stairs earlier this morning. pain is located in the lower lumbar spine and coccyx region.  Patient states she is able to angulate induces pain.  Patient to not drive here today.  There is no alleviating factors.  Movement and palpation cause aggravating factors.  Patient denies numbness and tingling of her lower extremities, bowel or bladder loss of function, perianal numbness or weakness of extremities.  Patient has no other complaints.  Patient is a 51 y.o. female presenting with fall. The history is provided by the patient.  Fall The accident occurred 3 to 5 hours ago. Incident: Walking down stairs. She landed on a hard floor. There was no blood loss. Pertinent negatives include no fever, no numbness, no abdominal pain, no nausea and no headaches.    Past Medical History  Diagnosis Date  . Hypertension     Pt on lisinopril  . GERD (gastroesophageal reflux disease)     Pt on Protonix daily  . Arthritis     Takes Naprosyn   . Diabetes mellitus     Takes Metformin 750 mg  . Hyperlipidemia   . Asthma   . COPD (chronic obstructive pulmonary disease)   . Shortness of breath     occasional - uses breathing tx at home  . Headache     otc meds prn  . Irritable bowel syndrome 11/19/2010    Past Surgical History  Procedure Date  . Hernia repair   . Cholecystectomy   . Svd      x 2  . Tubal ligation   . Endometrial ablation 10/2010    No family history on file.  History  Substance Use Topics  . Smoking status: Former Smoker -- 1.0 packs/day for 10 years    Types:  Cigarettes    Quit date: 09/28/2000  . Smokeless tobacco: Never Used  . Alcohol Use: No    OB History    Grav Para Term Preterm Abortions TAB SAB Ect Mult Living                  Review of Systems  Constitutional: Positive for activity change. Negative for fever, chills, fatigue and unexpected weight change.  HENT: Negative for neck pain and neck stiffness.   Eyes: Negative for visual disturbance.  Respiratory: Negative for shortness of breath.   Cardiovascular: Negative for chest pain and leg swelling.  Gastrointestinal: Negative for nausea, abdominal pain, constipation and rectal pain.  Genitourinary: Negative for urgency and difficulty urinating.       Patient denies bowel and bladder incontinence.  Musculoskeletal: Positive for back pain and gait problem. Negative for myalgias, joint swelling and arthralgias.  Neurological: Negative for weakness, numbness and headaches.  All other systems reviewed and are negative.    Allergies  Other and Aspirin  Home Medications   Current Outpatient Rx  Name Route Sig Dispense Refill  . ALBUTEROL SULFATE (5 MG/ML) 0.5% IN NEBU Nebulization Take 2.5 mg by nebulization every 4 (four) hours as needed. asthma     . ESTROGENS CONJUGATED 0.625 MG PO TABS Oral Take 0.625 mg  by mouth daily. Take daily for 21 days then do not take for 7 days.     Marland Kitchen FLUTICASONE FUROATE 27.5 MCG/SPRAY NA SUSP Nasal Place 1 spray into the nose daily.      Marland Kitchen FLUTICASONE-SALMETEROL 500-50 MCG/DOSE IN AEPB Inhalation Inhale 1 puff into the lungs every 12 (twelve) hours.      Marland Kitchen HYDROCHLOROTHIAZIDE 12.5 MG PO CAPS Oral Take 12.5 mg by mouth daily.      . IPRATROPIUM BROMIDE 0.02 % IN SOLN Nebulization Take 500 mcg by nebulization every 4 (four) hours as needed. asthma     . LISINOPRIL 40 MG PO TABS Oral Take 40 mg by mouth daily.      Marland Kitchen METFORMIN HCL ER 750 MG PO TB24 Oral Take 750 mg by mouth daily with breakfast.      . NAPROXEN 500 MG PO TABS Oral Take 500 mg by  mouth 2 (two) times daily with a meal.      . PANTOPRAZOLE SODIUM 40 MG PO TBEC Oral Take 40 mg by mouth daily.      Marland Kitchen SIMVASTATIN 20 MG PO TABS Oral Take 20 mg by mouth at bedtime.      Marland Kitchen SITAGLIPTIN PHOSPHATE 100 MG PO TABS Oral Take 100 mg by mouth daily.        BP 115/66  Pulse 67  Resp 16  SpO2 98%  Physical Exam  Nursing note and vitals reviewed. Constitutional: She is oriented to person, place, and time. She appears well-developed and well-nourished. No distress.  HENT:  Head: Normocephalic and atraumatic.  Eyes: Conjunctivae and EOM are normal. Pupils are equal, round, and reactive to light. No scleral icterus.  Neck: Normal range of motion and full passive range of motion without pain. Neck supple. No spinous process tenderness and no muscular tenderness present. No rigidity. Normal range of motion present. No Brudzinski's sign noted.  Cardiovascular: Normal rate, regular rhythm and intact distal pulses.  Exam reveals no gallop and no friction rub.   No murmur heard. Pulmonary/Chest: Effort normal and breath sounds normal. No respiratory distress. She has no wheezes. She has no rales. She exhibits no tenderness.  Musculoskeletal:       Cervical back: She exhibits normal range of motion, no tenderness, no bony tenderness and no pain.       Thoracic back: She exhibits no tenderness, no bony tenderness and no pain.       Lumbar back: She exhibits tenderness, bony tenderness and pain. She exhibits no spasm and normal pulse.       Right foot: She exhibits no swelling.       Left foot: She exhibits no swelling.       Bilateral lower extremities nontender without color change, baseline range of motion of extremities with intact distal pulses, capillary refill less than 2 seconds bilaterally.  Pt has increased pain w ROM of lumbar spine. Pain w ambulation, no sign of ataxia.  Neurological: She is alert and oriented to person, place, and time. She has normal strength and normal reflexes.  No sensory deficit. Gait (no ataxia, slowed and hunched d/t pain ) abnormal.       sensation at baseline for light touch in all 4 distal extremities, motor symmetric & bilateral 5/5  Abduction,adduction,flexion of hips, knee flexion & extension, foot dorsiflexion, foot plantar flexion.  Skin: Skin is warm and dry. No rash noted. She is not diaphoretic. No erythema. No pallor.  Psychiatric: She has a normal mood and affect.  ED Course  Procedures (including critical care time)  Labs Reviewed - No data to display Dg Lumbar Spine Complete  02/22/2011  *RADIOLOGY REPORT*  Clinical Data: Low back pain  LUMBAR SPINE - COMPLETE 4+ VIEW  Comparison: None.  Findings: Five non-rib bearing lumbar type vertebral bodies are identified.  Disc degenerative change noted at L4-L5.  Vertebral body heights are maintained.  No acute finding.  IMPRESSION: L4-L5 disc degenerative change.  No acute finding.  Original Report Authenticated By: Harrel Lemon, M.D.   Dg Hip Bilateral Vito Berger  02/22/2011  *RADIOLOGY REPORT*  Clinical Data: Fall  BILATERAL HIP WITH PELVIS - 4+ VIEW  Comparison: None.  Findings: No fracture or dislocation is seen.  Bilateral hip joint spaces are symmetric.  Mild degenerative changes of the lower lumbar spine.  IMPRESSION: Normal hip radiographs.  Original Report Authenticated By: Charline Bills, M.D.     No diagnosis found.    MDM  Fall back pain   Patient with back pain.  No neurological deficits and normal neuro exam.  Patient can walk, states still painful, but pain has improved greatly.  No loss of bowel or bladder control.  No concern for cauda equina.  No fever, night sweats, weight loss, h/o cancer, IVDU.  RICE protocol and pain medicine indicated and discussed with patient.         Jaci Carrel, [place] 02/22/11 1239

## 2011-02-24 ENCOUNTER — Encounter: Payer: Self-pay | Admitting: Dietician

## 2011-02-24 ENCOUNTER — Ambulatory Visit: Payer: Self-pay | Admitting: Dietician

## 2011-02-24 ENCOUNTER — Encounter: Payer: Medicaid Other | Attending: Pulmonary Disease | Admitting: Dietician

## 2011-02-24 DIAGNOSIS — Z713 Dietary counseling and surveillance: Secondary | ICD-10-CM | POA: Insufficient documentation

## 2011-02-24 DIAGNOSIS — E119 Type 2 diabetes mellitus without complications: Secondary | ICD-10-CM | POA: Insufficient documentation

## 2011-02-24 NOTE — Progress Notes (Signed)
  Medical Nutrition Therapy:  Appt start time: 1200 end time:  1230.  Assessment:  Primary concerns today: Blood glucose levels and confirmation that her diabetes is staying within the range of control.  Last HgA1C on 11/20/10 was 6.0%.  Checking blood glucose levels one time per day.  Fasting levels: 122, 115, 108, 130, 96, 98, 125, 98, 117, 89, 105 mg.  Recommended that she test one time per day but to vary testing times. Today, she is having low back pain.  On Sunday, 02/22/11, she fell and "bounced down the stairs on my tail bone."  Was seen in the ED at [facility] and discharged with pain pills and anti-inflammatory medication. Has gained 7.2 lb on our scales since her last visit 09/29/2010.  She is unable to maintain a regular exercise regimen.  She does however use the bus for transportation and has to walk a number of blocks to get to the bus. Has had an increase in her Creatinine Kinase levels and Dr. Chestine Spore stopped her simvastatin medication.  She had been complaining of cramping in the legs and feet on a regular basis.  MEDICATIONS: Medication for diabetes remains the Metformin 750 mg daily.  DIETARY INTAKE:Gives a history of regular meals.  Her transportation ride came and she left prior to [place] obtaining a food recall.  Recent physical activity: No regular exercise regimen. Does use public transportation.  Estimated energy needs:HT: 66 in,  WT: 161.6 lb,  BMI:26.1%,  Adj. WT: 140 lb (64 kg) 1400-1500 calories 160-165 g carbohydrates 105-110 g protein 38-40 g fat  Progress Towards Goal(s):  In progress.   Nutritional Diagnosis:  [place]-2.1 Inpaired nutrition utilization As related to carbohydrate and glucose .  As evidenced by diagnosis of type 2 diabetes and need for Metformin to help control blood glucose levels.    Intervention:  Nutrition Will follow-up with more dietary detail at next appointment.  Handouts given during visit include:  Blood glucose logs  Handout regarding  information on CoQ 10.     Monitoring/Evaluation:  Dietary intake, exercise, blood glucose levels, and body weight in three months.

## 2011-02-24 NOTE — Patient Instructions (Addendum)
   Take the ibuprofen with food.  Talk to Dr. Petra Kuba about a pill that would combine the hydrochlorothiazide and the lisinopril.   Vary the times you test you glucose.  If you don't test in the morning, then test before dinner or after dinner or at bedtime.  Record the time in your blood glucose book.  Remind him of the simvastatin change.    Call for a follow- up appointment in 3 months.  [phone]

## 2011-03-01 ENCOUNTER — Encounter (HOSPITAL_COMMUNITY): Payer: Self-pay | Admitting: *Deleted

## 2011-03-01 ENCOUNTER — Emergency Department (HOSPITAL_COMMUNITY)
Admission: EM | Admit: 2011-03-01 | Discharge: 2011-03-01 | Disposition: A | Discharge status: Home / Self Care | Payer: Medicaid Other | Attending: Emergency Medicine | Admitting: Emergency Medicine

## 2011-03-01 DIAGNOSIS — R1013 Epigastric pain: Secondary | ICD-10-CM | POA: Insufficient documentation

## 2011-03-01 DIAGNOSIS — Z79899 Other long term (current) drug therapy: Secondary | ICD-10-CM | POA: Insufficient documentation

## 2011-03-01 DIAGNOSIS — R51 Headache: Secondary | ICD-10-CM | POA: Insufficient documentation

## 2011-03-01 DIAGNOSIS — J449 Chronic obstructive pulmonary disease, unspecified: Secondary | ICD-10-CM | POA: Insufficient documentation

## 2011-03-01 DIAGNOSIS — I10 Essential (primary) hypertension: Secondary | ICD-10-CM | POA: Insufficient documentation

## 2011-03-01 DIAGNOSIS — M129 Arthropathy, unspecified: Secondary | ICD-10-CM | POA: Insufficient documentation

## 2011-03-01 DIAGNOSIS — E119 Type 2 diabetes mellitus without complications: Secondary | ICD-10-CM | POA: Insufficient documentation

## 2011-03-01 DIAGNOSIS — J4489 Other specified chronic obstructive pulmonary disease: Secondary | ICD-10-CM | POA: Insufficient documentation

## 2011-03-01 DIAGNOSIS — K219 Gastro-esophageal reflux disease without esophagitis: Secondary | ICD-10-CM | POA: Insufficient documentation

## 2011-03-01 DIAGNOSIS — K589 Irritable bowel syndrome without diarrhea: Secondary | ICD-10-CM | POA: Insufficient documentation

## 2011-03-01 DIAGNOSIS — R112 Nausea with vomiting, unspecified: Secondary | ICD-10-CM | POA: Insufficient documentation

## 2011-03-01 LAB — COMPREHENSIVE METABOLIC PANEL
ALT: 16 U/L (ref 0–35)
AST: 18 U/L (ref 0–37)
Albumin: 4.1 g/dL (ref 3.5–5.2)
Alkaline Phosphatase: 73 U/L (ref 39–117)
BUN: 10 mg/dL (ref 6–23)
CO2: 27 mEq/L (ref 19–32)
Calcium: 10.2 mg/dL (ref 8.4–10.5)
Chloride: 96 mEq/L (ref 96–112)
Creatinine, Ser: 0.81 mg/dL (ref 0.50–1.10)
GFR calc Af Amer: >90 mL/min (ref >90)
GFR calc non Af Amer: 83 mL/min — ABNORMAL LOW (ref >90)
Glucose, Bld: 73 mg/dL (ref 70–99)
Potassium: 3.6 mEq/L (ref 3.5–5.1)
Sodium: 135 mEq/L (ref 135–145)
Total Bilirubin: 0.2 mg/dL — ABNORMAL LOW (ref 0.3–1.2)
Total Protein: 7.7 g/dL (ref 6.0–8.3)

## 2011-03-01 LAB — CBC
HCT: 36.8 % (ref 36.0–46.0)
Hemoglobin: 12.8 g/dL (ref 12.0–15.0)
MCH: 28.1 pg (ref 26.0–34.0)
MCHC: 34.8 g/dL (ref 30.0–36.0)
MCV: 80.7 fL (ref 78.0–100.0)
Platelets: 290 10*3/uL (ref 150–400)
RBC: 4.56 MIL/uL (ref 3.87–5.11)
RDW: 14.3 % (ref 11.5–15.5)
WBC: 12.2 10*3/uL — ABNORMAL HIGH (ref 4.0–10.5)

## 2011-03-01 LAB — LIPASE, BLOOD: Lipase: 31 U/L (ref 11–59)

## 2011-03-01 MED ORDER — HYDROMORPHONE HCL PF 1 MG/ML IJ SOLN
0.5000 mg | Freq: Once | INTRAMUSCULAR | Status: AC
  Administered 2011-03-01: 0.5 mg via INTRAVENOUS
  Filled 2011-03-01 (×2): qty 1

## 2011-03-01 MED ORDER — ONDANSETRON HCL 4 MG/2ML IJ SOLN
4.0000 mg | Freq: Once | INTRAMUSCULAR | Status: AC
  Administered 2011-03-01: 4 mg via INTRAVENOUS
  Filled 2011-03-01: qty 2

## 2011-03-01 MED ORDER — SODIUM CHLORIDE 0.9 % IV BOLUS (SEPSIS)
1000.0000 mL | Freq: Once | INTRAVENOUS | Status: AC
  Administered 2011-03-01: 1000 mL via INTRAVENOUS

## 2011-03-01 MED ORDER — PROMETHAZINE HCL 25 MG PO TABS: 25.0000 mg | ORAL_TABLET | Freq: Four times a day (QID) | ORAL | Status: DC | PRN

## 2011-03-01 MED ORDER — PANTOPRAZOLE SODIUM 40 MG PO TBEC: 40.0000 mg | DELAYED_RELEASE_TABLET | Freq: Every day | ORAL | Status: DC

## 2011-03-01 NOTE — ED Notes (Signed)
Lab at bedside

## 2011-03-01 NOTE — ED Provider Notes (Signed)
History     CSN: [id number]  Arrival date & time 03/01/11  1011   First MD Initiated Contact with Patient 03/01/11 1048      Chief Complaint  Patient presents with  . Abdominal Pain    bilateral upper quadrants  . Nausea  . Emesis    The history is provided by the patient.   patient reports development of acute epigastric pain this morning.  She's had associated nausea and vomiting.  She denies fevers or chills.  She denies diarrhea.  She reports is intermittently had this pain before in the past without an etiology determined.  She's had a prior cholecystectomy.  She reports the pain became severe after taking an aspirin.  She denies hematemesis.  She denies melena and hematochezia.  Denies chest pain shortness of breath.  She denies radiation of her pain.  She has a prior history of pancreatitis however she denies alcohol use.  She also denies prior alcohol use or abuse.  She has a history of hypertension GERD diabetes and COPD.  She has never had a endoscopy.  Nothing worsens her symptoms.  Nothing improves her symptoms.  Her symptoms are constant.  Past Medical History  Diagnosis Date  . Hypertension     Pt on lisinopril  . GERD (gastroesophageal reflux disease)     Pt on Protonix daily  . Arthritis     Takes Naprosyn   . Diabetes mellitus     Takes Metformin 750 mg  . Hyperlipidemia   . Asthma   . COPD (chronic obstructive pulmonary disease)   . Shortness of breath     occasional - uses breathing tx at home  . Headache     otc meds prn  . Irritable bowel syndrome 11/19/2010    Past Surgical History  Procedure Date  . Hernia repair   . Cholecystectomy   . Svd      x 2  . Tubal ligation   . Endometrial ablation 10/2010    History reviewed. No pertinent family history.  History  Substance Use Topics  . Smoking status: Former Smoker -- 1.0 packs/day for 10 years    Types: Cigarettes    Quit date: 09/28/2000  . Smokeless tobacco: Never Used  . Alcohol Use: No      OB History    Grav Para Term Preterm Abortions TAB SAB Ect Mult Living                  Review of Systems  Gastrointestinal: Positive for vomiting and abdominal pain.  All other systems reviewed and are negative.    Allergies  Other and Aspirin  Home Medications   Current Outpatient Rx  Name Route Sig Dispense Refill  . ALBUTEROL SULFATE (5 MG/ML) 0.5% IN NEBU Nebulization Take 2.5 mg by nebulization every 4 (four) hours as needed. asthma     . ESTROGENS CONJUGATED 0.625 MG PO TABS Oral Take 0.625 mg by mouth daily. Take daily for 21 days then do not take for 7 days.     Marland Kitchen FLUTICASONE FUROATE 27.5 MCG/SPRAY NA SUSP Nasal Place 1 spray into the nose daily.      Marland Kitchen FLUTICASONE-SALMETEROL 500-50 MCG/DOSE IN AEPB Inhalation Inhale 1 puff into the lungs every 12 (twelve) hours.      Marland Kitchen HYDROCHLOROTHIAZIDE 12.5 MG PO CAPS Oral Take 12.5 mg by mouth daily.      . IBUPROFEN 800 MG PO TABS Oral Take 1 tablet (800 mg total) by mouth every  8 (eight) hours as needed for pain. 21 tablet 0  . IPRATROPIUM BROMIDE 0.02 % IN SOLN Nebulization Take 500 mcg by nebulization every 4 (four) hours as needed. asthma     . LISINOPRIL 40 MG PO TABS Oral Take 40 mg by mouth daily.      Marland Kitchen METFORMIN HCL ER 750 MG PO TB24 Oral Take 750 mg by mouth daily with breakfast.      . NAPROXEN 500 MG PO TABS Oral Take 500 mg by mouth 2 (two) times daily with a meal.      . OXYCODONE-ACETAMINOPHEN 5-325 MG PO TABS Oral Take 1 tablet by mouth daily.    Marland Kitchen PANTOPRAZOLE SODIUM 40 MG PO TBEC Oral Take 40 mg by mouth daily.      Marland Kitchen SITAGLIPTIN PHOSPHATE 100 MG PO TABS Oral Take 100 mg by mouth daily.        BP 104/68  Pulse 78  Temp(Src) 98.6 F (37 C) (Oral)  Resp 18  Ht 5\' 6"  (1.676 m)  Wt 161 lb (73.029 kg)  BMI 25.99 kg/m2  SpO2 99%  Physical Exam  Nursing note and vitals reviewed. Constitutional: She is oriented to person, place, and time. She appears well-developed and well-nourished. No distress.  HENT:   Head: Normocephalic and atraumatic.  Eyes: EOM are normal.  Neck: Normal range of motion.  Cardiovascular: Normal rate, regular rhythm and normal heart sounds.   Pulmonary/Chest: Effort normal and breath sounds normal.  Abdominal: Soft. She exhibits no distension.       Mild epigastric and right upper quadrant tenderness without guarding or rebound.  Scars right upper quadrant consistent with prior cholecystectomy  Musculoskeletal: Normal range of motion.  Neurological: She is alert and oriented to person, place, and time.  Skin: Skin is warm and dry.  Psychiatric: She has a normal mood and affect. Judgment normal.    ED Course  Procedures (including critical care time)  Labs Reviewed  CBC - Abnormal; Notable for the following:    WBC 12.2 (*)    All other components within normal limits  COMPREHENSIVE METABOLIC PANEL - Abnormal; Notable for the following:    Total Bilirubin 0.2 (*)    GFR calc non Af Amer 83 (*)    All other components within normal limits  LIPASE, BLOOD   No results found.   1. Abdominal pain       MDM  Will treat pain and nausea at this time.  IV fluids given.  We'll obtain baseline labs.  This may represent pancreatitis versus gastritis.  1:14 PM The patient feels much better at this time.  We'll discharge the patient home on Prilosec.  She needs referral to her gastroenterologist Dr. Madilyn Fireman.  She has intermittently had these symptoms before.  She also reports prior endoscopy without pathology noted.  She's had a cholecystectomy.  I don't think the patient requires evaluation with imaging today.        Lyanne Co, MD 03/01/11 [phone]

## 2011-03-01 NOTE — ED Notes (Signed)
Pt came from church after sudden onset of bilateral upper quadrant pain, nausea and vomitting. Pt reports episode happened after taking 81mg  Aspirin and has happened before with taking Aspirin.

## 2011-03-06 ENCOUNTER — Emergency Department (HOSPITAL_COMMUNITY): Payer: Medicaid Other

## 2011-03-06 ENCOUNTER — Emergency Department (HOSPITAL_COMMUNITY)
Admission: EM | Admit: 2011-03-06 | Discharge: 2011-03-06 | Disposition: A | Discharge status: Home / Self Care | Payer: Medicaid Other | Attending: Emergency Medicine | Admitting: Emergency Medicine

## 2011-03-06 DIAGNOSIS — E119 Type 2 diabetes mellitus without complications: Secondary | ICD-10-CM | POA: Insufficient documentation

## 2011-03-06 DIAGNOSIS — R112 Nausea with vomiting, unspecified: Secondary | ICD-10-CM | POA: Insufficient documentation

## 2011-03-06 DIAGNOSIS — I10 Essential (primary) hypertension: Secondary | ICD-10-CM | POA: Insufficient documentation

## 2011-03-06 DIAGNOSIS — E860 Dehydration: Secondary | ICD-10-CM

## 2011-03-06 DIAGNOSIS — R10817 Generalized abdominal tenderness: Secondary | ICD-10-CM | POA: Insufficient documentation

## 2011-03-06 DIAGNOSIS — R109 Unspecified abdominal pain: Secondary | ICD-10-CM | POA: Insufficient documentation

## 2011-03-06 LAB — DIFFERENTIAL
Basophils Absolute: 0 10*3/uL (ref 0.0–0.1)
Basophils Relative: 0 % (ref 0–1)
Eosinophils Absolute: 0.1 10*3/uL (ref 0.0–0.7)
Eosinophils Relative: 1 % (ref 0–5)
Lymphocytes Relative: 51 % — ABNORMAL HIGH (ref 12–46)
Lymphs Abs: 3.6 10*3/uL (ref 0.7–4.0)
Monocytes Absolute: 0.4 10*3/uL (ref 0.1–1.0)
Monocytes Relative: 5 % (ref 3–12)
Neutro Abs: 3 10*3/uL (ref 1.7–7.7)
Neutrophils Relative %: 42 % — ABNORMAL LOW (ref 43–77)

## 2011-03-06 LAB — URINALYSIS, ROUTINE W REFLEX MICROSCOPIC
Bilirubin Urine: NEGATIVE
Glucose, UA: NEGATIVE mg/dL
Hgb urine dipstick: NEGATIVE
Ketones, ur: NEGATIVE mg/dL
Leukocytes, UA: NEGATIVE
Nitrite: NEGATIVE
Protein, ur: NEGATIVE mg/dL
Specific Gravity, Urine: 1.011 (ref 1.005–1.030)
Urobilinogen, UA: 0.2 mg/dL (ref 0.0–1.0)
pH: 6 (ref 5.0–8.0)

## 2011-03-06 LAB — CBC
HCT: 36.7 % (ref 36.0–46.0)
Hemoglobin: 12.8 g/dL (ref 12.0–15.0)
MCH: 28.3 pg (ref 26.0–34.0)
MCHC: 34.9 g/dL (ref 30.0–36.0)
MCV: 81 fL (ref 78.0–100.0)
Platelets: 266 10*3/uL (ref 150–400)
RBC: 4.53 MIL/uL (ref 3.87–5.11)
RDW: 13.9 % (ref 11.5–15.5)
WBC: 7 10*3/uL (ref 4.0–10.5)

## 2011-03-06 LAB — LACTIC ACID, PLASMA: Lactic Acid, Venous: 1 mmol/L (ref 0.5–2.2)

## 2011-03-06 LAB — COMPREHENSIVE METABOLIC PANEL
ALT: 13 U/L (ref 0–35)
AST: 17 U/L (ref 0–37)
Albumin: 4.2 g/dL (ref 3.5–5.2)
Alkaline Phosphatase: 67 U/L (ref 39–117)
BUN: 10 mg/dL (ref 6–23)
CO2: 27 mEq/L (ref 19–32)
Calcium: 9.6 mg/dL (ref 8.4–10.5)
Chloride: 96 mEq/L (ref 96–112)
Creatinine, Ser: 0.93 mg/dL (ref 0.50–1.10)
GFR calc Af Amer: 81 mL/min — ABNORMAL LOW (ref >90)
GFR calc non Af Amer: 70 mL/min — ABNORMAL LOW (ref >90)
Glucose, Bld: 91 mg/dL (ref 70–99)
Potassium: 3.9 mEq/L (ref 3.5–5.1)
Sodium: 132 mEq/L — ABNORMAL LOW (ref 135–145)
Total Bilirubin: 0.3 mg/dL (ref 0.3–1.2)
Total Protein: 7.5 g/dL (ref 6.0–8.3)

## 2011-03-06 LAB — LIPASE, BLOOD: Lipase: 39 U/L (ref 11–59)

## 2011-03-06 LAB — OCCULT BLOOD, POC DEVICE: Fecal Occult Bld: NEGATIVE

## 2011-03-06 MED ORDER — IOHEXOL 300 MG/ML  SOLN
100.0000 mL | Freq: Once | INTRAMUSCULAR | Status: AC | PRN
  Administered 2011-03-06: 100 mL via INTRAVENOUS

## 2011-03-06 MED ORDER — ONDANSETRON HCL 4 MG/2ML IJ SOLN
4.0000 mg | Freq: Once | INTRAMUSCULAR | Status: AC
  Administered 2011-03-06: 4 mg via INTRAVENOUS
  Filled 2011-03-06: qty 2

## 2011-03-06 MED ORDER — MORPHINE SULFATE 4 MG/ML IJ SOLN
4.0000 mg | Freq: Once | INTRAMUSCULAR | Status: AC
  Administered 2011-03-06: 4 mg via INTRAVENOUS
  Filled 2011-03-06: qty 1

## 2011-03-06 MED ORDER — SODIUM CHLORIDE 0.9 % IV BOLUS (SEPSIS)
1000.0000 mL | Freq: Once | INTRAVENOUS | Status: AC
  Administered 2011-03-06: 1000 mL via INTRAVENOUS

## 2011-03-06 MED ORDER — PANTOPRAZOLE SODIUM 40 MG IV SOLR
40.0000 mg | Freq: Once | INTRAVENOUS | Status: AC
  Administered 2011-03-06: 40 mg via INTRAVENOUS
  Filled 2011-03-06: qty 40

## 2011-03-06 NOTE — ED Provider Notes (Signed)
History     CSN: [id number]  Arrival date & time 03/06/11  1246   First MD Initiated Contact with Patient 03/06/11 1258      No chief complaint on file.   (Consider location/radiation/quality/duration/timing/severity/associated sxs/prior treatment) HPI  51 year old female with history of diabetes, GERD, and irritable bowel syndromes is present to the ED with chief complaint of abdominal pain. Patient states for the past week she has been having increased abdominal pain. Pain is mainly to the epigastric but also to lower abdomen. She described pain as sharp, constant, and burning. She mentioned that she has no appetite and unable to keep anything down. She is having constant nausea and occasional nonbloody, nonbilious vomit. States she hasn't been able to eat for the past 4 days. Her last bowel movement was 2 weeks ago. Prior to that, she had regular bowel movement daily. She denies fever, headache, chest pain, shortness of breath, dysuria, vaginal discharge.  Patient was seen in the ER 4 days ago with essentially the same symptoms. Her workup was unremarkable. She was given proton pump inhibitor a referral for GI specialist, Dr. Madilyn Fireman. Patient states she is not able to keep down any of her medication. She has not follow up with the GI specialist as previously recommended. She denies having prior colonoscopy. Has hx of cholecystectomy.  Past Medical History  Diagnosis Date  . Hypertension     Pt on lisinopril  . GERD (gastroesophageal reflux disease)     Pt on Protonix daily  . Arthritis     Takes Naprosyn   . Diabetes mellitus     Takes Metformin 750 mg  . Hyperlipidemia   . Asthma   . COPD (chronic obstructive pulmonary disease)   . Shortness of breath     occasional - uses breathing tx at home  . Headache     otc meds prn  . Irritable bowel syndrome 11/19/2010    Past Surgical History  Procedure Date  . Hernia repair   . Cholecystectomy   . Svd      x 2  . Tubal ligation    . Endometrial ablation 10/2010    No family history on file.  History  Substance Use Topics  . Smoking status: Former Smoker -- 1.0 packs/day for 10 years    Types: Cigarettes    Quit date: 09/28/2000  . Smokeless tobacco: Never Used  . Alcohol Use: No    OB History    Grav Para Term Preterm Abortions TAB SAB Ect Mult Living                  Review of Systems  All other systems reviewed and are negative.    Allergies  Other and Aspirin  Home Medications   Current Outpatient Rx  Name Route Sig Dispense Refill  . ALBUTEROL SULFATE (5 MG/ML) 0.5% IN NEBU Nebulization Take 2.5 mg by nebulization every 4 (four) hours as needed. asthma     . ESTROGENS CONJUGATED 0.625 MG PO TABS Oral Take 0.625 mg by mouth daily. Take daily for 21 days then do not take for 7 days.     Marland Kitchen FLUTICASONE FUROATE 27.5 MCG/SPRAY NA SUSP Nasal Place 1 spray into the nose daily.      Marland Kitchen FLUTICASONE-SALMETEROL 500-50 MCG/DOSE IN AEPB Inhalation Inhale 1 puff into the lungs every 12 (twelve) hours.      Marland Kitchen HYDROCHLOROTHIAZIDE 12.5 MG PO CAPS Oral Take 12.5 mg by mouth daily.      Marland Kitchen  IPRATROPIUM BROMIDE 0.02 % IN SOLN Nebulization Take 500 mcg by nebulization every 4 (four) hours as needed. asthma     . LISINOPRIL 40 MG PO TABS Oral Take 40 mg by mouth daily.      Marland Kitchen METFORMIN HCL ER 750 MG PO TB24 Oral Take 750 mg by mouth daily with breakfast.      . NAPROXEN 500 MG PO TABS Oral Take 500 mg by mouth 2 (two) times daily with a meal.      . PANTOPRAZOLE SODIUM 40 MG PO TBEC Oral Take 1 tablet (40 mg total) by mouth daily. 30 tablet 0  . PROMETHAZINE HCL 25 MG PO TABS Oral Take 1 tablet (25 mg total) by mouth every 6 (six) hours as needed for nausea. 12 tablet 0  . SITAGLIPTIN PHOSPHATE 100 MG PO TABS Oral Take 100 mg by mouth daily.        There were no vitals taken for this visit.  Physical Exam  Nursing note and vitals reviewed. Constitutional: She appears well-developed and well-nourished. No  distress.       Awake, alert, nontoxic appearance  HENT:  Head: Atraumatic.  Mouth/Throat: Oropharynx is clear and moist.  Eyes: Conjunctivae are normal. Right eye exhibits no discharge. Left eye exhibits no discharge.  Neck: Neck supple.  Cardiovascular: Normal rate and regular rhythm.   Pulmonary/Chest: Effort normal. No respiratory distress. She has no wheezes. She has no rales. She exhibits no tenderness.  Abdominal: Soft. There is generalized tenderness. There is no rigidity, no rebound, no guarding, no tenderness at McBurney's point and negative Murphy's sign. No hernia. Hernia confirmed negative in the ventral area, confirmed negative in the right inguinal area and confirmed negative in the left inguinal area.  Musculoskeletal: She exhibits no tenderness.       Baseline ROM, no obvious new focal weakness  Neurological:       Mental status and motor strength appears baseline for patient and situation  Skin: No rash noted.  Psychiatric: She has a normal mood and affect.    ED Course  Procedures (including critical care time)  Labs Reviewed - No data to display No results found.   No diagnosis found.  Results for orders placed during the hospital encounter of 03/06/11  CBC      Component Value Range   WBC 7.0  4.0 - 10.5 (K/uL)   RBC 4.53  3.87 - 5.11 (MIL/uL)   Hemoglobin 12.8  12.0 - 15.0 (g/dL)   HCT 16.1  09.6 - 04.5 (%)   MCV 81.0  78.0 - 100.0 (fL)   MCH 28.3  26.0 - 34.0 (pg)   MCHC 34.9  30.0 - 36.0 (g/dL)   RDW 40.9  81.1 - 91.4 (%)   Platelets 266  150 - 400 (K/uL)  DIFFERENTIAL      Component Value Range   Neutrophils Relative 42 (*) 43 - 77 (%)   Neutro Abs 3.0  1.7 - 7.7 (K/uL)   Lymphocytes Relative 51 (*) 12 - 46 (%)   Lymphs Abs 3.6  0.7 - 4.0 (K/uL)   Monocytes Relative 5  3 - 12 (%)   Monocytes Absolute 0.4  0.1 - 1.0 (K/uL)   Eosinophils Relative 1  0 - 5 (%)   Eosinophils Absolute 0.1  0.0 - 0.7 (K/uL)   Basophils Relative 0  0 - 1 (%)    Basophils Absolute 0.0  0.0 - 0.1 (K/uL)  COMPREHENSIVE METABOLIC PANEL  Component Value Range   Sodium 132 (*) 135 - 145 (mEq/L)   Potassium 3.9  3.5 - 5.1 (mEq/L)   Chloride 96  96 - 112 (mEq/L)   CO2 27  19 - 32 (mEq/L)   Glucose, Bld 91  70 - 99 (mg/dL)   BUN 10  6 - 23 (mg/dL)   Creatinine, Ser 1.61  0.50 - 1.10 (mg/dL)   Calcium 9.6  8.4 - 09.6 (mg/dL)   Total Protein 7.5  6.0 - 8.3 (g/dL)   Albumin 4.2  3.5 - 5.2 (g/dL)   AST 17  0 - 37 (U/L)   ALT 13  0 - 35 (U/L)   Alkaline Phosphatase 67  39 - 117 (U/L)   Total Bilirubin 0.3  0.3 - 1.2 (mg/dL)   GFR calc non Af Amer 70 (*) >90 (mL/min)   GFR calc Af Amer 81 (*) >90 (mL/min)  LIPASE, BLOOD      Component Value Range   Lipase 39  11 - 59 (U/L)  URINALYSIS, ROUTINE W REFLEX MICROSCOPIC      Component Value Range   Color, Urine YELLOW  YELLOW    APPearance CLEAR  CLEAR    Specific Gravity, Urine 1.011  1.005 - 1.030    pH 6.0  5.0 - 8.0    Glucose, UA NEGATIVE  NEGATIVE (mg/dL)   Hgb urine dipstick NEGATIVE  NEGATIVE    Bilirubin Urine NEGATIVE  NEGATIVE    Ketones, ur NEGATIVE  NEGATIVE (mg/dL)   Protein, ur NEGATIVE  NEGATIVE (mg/dL)   Urobilinogen, UA 0.2  0.0 - 1.0 (mg/dL)   Nitrite NEGATIVE  NEGATIVE    Leukocytes, UA NEGATIVE  NEGATIVE   LACTIC ACID, PLASMA      Component Value Range   Lactic Acid, Venous 1.0  0.5 - 2.2 (mmol/L)  OCCULT BLOOD, POC DEVICE      Component Value Range   Fecal Occult Bld NEGATIVE     Dg Lumbar Spine Complete  02/22/2011  *RADIOLOGY REPORT*  Clinical Data: Low back pain  LUMBAR SPINE - COMPLETE 4+ VIEW  Comparison: None.  Findings: Five non-rib bearing lumbar type vertebral bodies are identified.  Disc degenerative change noted at L4-L5.  Vertebral body heights are maintained.  No acute finding.  IMPRESSION: L4-L5 disc degenerative change.  No acute finding.  Original Report Authenticated By: Harrel Lemon, M.D.   Dg Hip Bilateral Vito Berger  02/22/2011  *RADIOLOGY REPORT*   Clinical Data: Fall  BILATERAL HIP WITH PELVIS - 4+ VIEW  Comparison: None.  Findings: No fracture or dislocation is seen.  Bilateral hip joint spaces are symmetric.  Mild degenerative changes of the lower lumbar spine.  IMPRESSION: Normal hip radiographs.  Original Report Authenticated By: Charline Bills, M.D.   Ct Abdomen Pelvis W Contrast  03/06/2011  *RADIOLOGY REPORT*  Clinical Data: Diffuse abdominal pain.  CT ABDOMEN AND PELVIS WITH CONTRAST  Technique:  Multidetector CT imaging of the abdomen and pelvis was performed following the standard protocol during bolus administration of intravenous contrast.  Contrast:  100 ml Omnipaque-300  Comparison: 05/29/2007  Findings: 7 mm low density lesion in the inferior right liver is not substantially changed in the interval.  There is some mild intra and extrahepatic biliary duct dilatation also not substantially changed.  The spleen is unremarkable.  Stomach, duodenum, pancreas, and adrenal glands are unremarkable.  The gallbladder is surgically absent.  Kidneys have normal imaging features.  No abdominal aortic aneurysm.  No free fluid or lymphadenopathy.  The abdominal bowel loops are unremarkable.  Imaging through the pelvis shows no free intraperitoneal fluid. There is no pelvic sidewall lymphadenopathy.  Bladder is normal. Uterus is unremarkable.  No evidence for an adnexal mass.  No evidence for colonic diverticulitis.  The colonic stool volume is somewhat prominent and could be compatible with a degree of clinical constipation.  The terminal ileum is normal.  The appendix is normal.  Bone windows reveal no worrisome lytic or sclerotic osseous lesions.  IMPRESSION: No acute findings in the abdomen or pelvis.  The stool volume is somewhat prominent throughout the colon raising the question of a degree of clinical constipation.  Original Report Authenticated By: ERIC A. MANSELL, M.D.      MDM  Repeated it for abdominal pain with associated nausea and  vomiting. I will repeat, I will also obtain abdominal CT for further evaluation.  4:41 PM Workup today was unremarkable. Patient appeared mildly dehydrated which she has been receiving IV fluid. Patient states her symptoms improved. She was able to keep oral contrast and without nausea or vomiting. Her abdominal CT scan was benign. No evidence of constipation on my evaluation. Her labs are within normal limits. I recommend for patient to follow with Dr. Madilyn Fireman, which she agrees.      Fayrene Helper, PA-C 03/06/11 1643

## 2011-03-06 NOTE — ED Notes (Signed)
Pt was seen here for the same CC on Sun 03/01/11.  States she was given phenergan RX and cant keep it down.  C/o diffuse abd pain, worse w/palaption.  States LBM was over 2 wks ago.  Belly is soft but tender.

## 2011-03-06 NOTE — ED Notes (Signed)
Patient is resting comfortably. 

## 2011-03-06 NOTE — ED Notes (Signed)
Pt returned from CT °

## 2011-03-07 ENCOUNTER — Encounter (HOSPITAL_COMMUNITY): Payer: Self-pay | Admitting: *Deleted

## 2011-03-07 ENCOUNTER — Emergency Department (HOSPITAL_COMMUNITY)
Admission: EM | Admit: 2011-03-07 | Discharge: 2011-03-08 | Disposition: A | Discharge status: Home / Self Care | Payer: Medicaid Other | Attending: Emergency Medicine | Admitting: Emergency Medicine

## 2011-03-07 DIAGNOSIS — I10 Essential (primary) hypertension: Secondary | ICD-10-CM | POA: Insufficient documentation

## 2011-03-07 DIAGNOSIS — Z79899 Other long term (current) drug therapy: Secondary | ICD-10-CM | POA: Insufficient documentation

## 2011-03-07 DIAGNOSIS — R112 Nausea with vomiting, unspecified: Secondary | ICD-10-CM | POA: Insufficient documentation

## 2011-03-07 DIAGNOSIS — E119 Type 2 diabetes mellitus without complications: Secondary | ICD-10-CM | POA: Insufficient documentation

## 2011-03-07 DIAGNOSIS — R10819 Abdominal tenderness, unspecified site: Secondary | ICD-10-CM | POA: Insufficient documentation

## 2011-03-07 NOTE — ED Notes (Signed)
Pt c/o continued n/v and epigastric pain. States unable to tolerate a bland diet. Pt states she has not urinated since yesterday.

## 2011-03-07 NOTE — ED Notes (Signed)
Pt states that she has had N/V/D x 6 days.  Pt states that she cannot keep food or fluid down.

## 2011-03-07 NOTE — ED Notes (Signed)
Pt brought to room by Fleet Contras RN at this time from waiting room

## 2011-03-08 LAB — COMPREHENSIVE METABOLIC PANEL
ALT: 11 U/L (ref 0–35)
AST: 16 U/L (ref 0–37)
Albumin: 3.8 g/dL (ref 3.5–5.2)
Alkaline Phosphatase: 60 U/L (ref 39–117)
BUN: 9 mg/dL (ref 6–23)
CO2: 27 mEq/L (ref 19–32)
Calcium: 9.3 mg/dL (ref 8.4–10.5)
Chloride: 100 mEq/L (ref 96–112)
Creatinine, Ser: 0.89 mg/dL (ref 0.50–1.10)
GFR calc Af Amer: 86 mL/min — ABNORMAL LOW (ref >90)
GFR calc non Af Amer: 74 mL/min — ABNORMAL LOW (ref >90)
Glucose, Bld: 81 mg/dL (ref 70–99)
Potassium: 3.8 mEq/L (ref 3.5–5.1)
Sodium: 136 mEq/L (ref 135–145)
Total Bilirubin: 0.2 mg/dL — ABNORMAL LOW (ref 0.3–1.2)
Total Protein: 7 g/dL (ref 6.0–8.3)

## 2011-03-08 LAB — URINALYSIS, ROUTINE W REFLEX MICROSCOPIC
Bilirubin Urine: NEGATIVE
Bilirubin Urine: NEGATIVE
Glucose, UA: NEGATIVE mg/dL
Glucose, UA: NEGATIVE mg/dL
Hgb urine dipstick: NEGATIVE
Hgb urine dipstick: NEGATIVE
Ketones, ur: NEGATIVE mg/dL
Ketones, ur: NEGATIVE mg/dL
Nitrite: NEGATIVE
Nitrite: NEGATIVE
Protein, ur: NEGATIVE mg/dL
Protein, ur: NEGATIVE mg/dL
Specific Gravity, Urine: 1.004 — ABNORMAL LOW (ref 1.005–1.030)
Specific Gravity, Urine: 1.005 (ref 1.005–1.030)
Urobilinogen, UA: 0.2 mg/dL (ref 0.0–1.0)
Urobilinogen, UA: 0.2 mg/dL (ref 0.0–1.0)
pH: 6.5 (ref 5.0–8.0)
pH: 6.5 (ref 5.0–8.0)

## 2011-03-08 LAB — URINE MICROSCOPIC-ADD ON

## 2011-03-08 LAB — LIPASE, BLOOD: Lipase: 42 U/L (ref 11–59)

## 2011-03-08 LAB — CBC
HCT: 34.3 % — ABNORMAL LOW (ref 36.0–46.0)
Hemoglobin: 11.9 g/dL — ABNORMAL LOW (ref 12.0–15.0)
MCH: 28.1 pg (ref 26.0–34.0)
MCHC: 34.7 g/dL (ref 30.0–36.0)
MCV: 80.9 fL (ref 78.0–100.0)
Platelets: 240 10*3/uL (ref 150–400)
RBC: 4.24 MIL/uL (ref 3.87–5.11)
RDW: 14.1 % (ref 11.5–15.5)
WBC: 7.8 10*3/uL (ref 4.0–10.5)

## 2011-03-08 LAB — POCT PREGNANCY, URINE: Preg Test, Ur: NEGATIVE

## 2011-03-08 MED ORDER — ONDANSETRON HCL 4 MG/2ML IJ SOLN
4.0000 mg | Freq: Once | INTRAMUSCULAR | Status: AC
  Administered 2011-03-08: 4 mg via INTRAVENOUS
  Filled 2011-03-08: qty 2

## 2011-03-08 MED ORDER — ONDANSETRON HCL 4 MG PO TABS: 4.0000 mg | ORAL_TABLET | Freq: Four times a day (QID) | ORAL | Status: AC

## 2011-03-08 MED ORDER — SODIUM CHLORIDE 0.9 % IV BOLUS (SEPSIS)
1000.0000 mL | Freq: Once | INTRAVENOUS | Status: AC
  Administered 2011-03-08: 1000 mL via INTRAVENOUS

## 2011-03-08 NOTE — ED Provider Notes (Signed)
History     CSN: [id number]  Arrival date & time 03/07/11  2057   First MD Initiated Contact with Patient 03/07/11 2221      Chief Complaint  Patient presents with  . Nausea  . Emesis    x 4-5 episodes since this morning  . Abdominal Pain    epigastric    (Consider location/radiation/quality/duration/timing/severity/associated sxs/prior treatment) HPI Patient presents with complaint of nausea and vomiting as well as upper abdominal pain. This is her third visit in the past several days for similar complaints. She was seen in the ED yesterday and states that she did feel much improved after antibiotics and fluids but then today he began to feel nauseated again. She states that she tried T. Chou pulse of and vomited afterwards. Emesis is nonbloody and nonbilious. She's had no diarrhea. Her last stool output was 2 days ago. She denies fever or chills. She states that her abdominal pain seems to be related to soreness of her muscles from vomiting. She denies any dysuria or urgency or frequency of urination. She states that she has called her primary doctor to give her referral to see Dr. Madilyn Fireman her GI doctor but she has not been able to obtain this yet. She denies any dizziness or syncope.  Past Medical History  Diagnosis Date  . Hypertension     Pt on lisinopril  . GERD (gastroesophageal reflux disease)     Pt on Protonix daily  . Arthritis     Takes Naprosyn   . Diabetes mellitus     Takes Metformin 750 mg  . Hyperlipidemia   . Asthma   . COPD (chronic obstructive pulmonary disease)   . Shortness of breath     occasional - uses breathing tx at home  . Headache     otc meds prn  . Irritable bowel syndrome 11/19/2010    Past Surgical History  Procedure Date  . Hernia repair   . Cholecystectomy   . Svd      x 2  . Tubal ligation   . Endometrial ablation 10/2010    No family history on file.  History  Substance Use Topics  . Smoking status: Former Smoker -- 1.0  packs/day for 10 years    Types: Cigarettes    Quit date: 09/28/2000  . Smokeless tobacco: Never Used  . Alcohol Use: No    OB History    Grav Para Term Preterm Abortions TAB SAB Ect Mult Living                  Review of Systems ROS reviewed and otherwise negative except for mentioned in HPI  Allergies  Other and Aspirin  Home Medications   Current Outpatient Rx  Name Route Sig Dispense Refill  . ALBUTEROL SULFATE (5 MG/ML) 0.5% IN NEBU Nebulization Take 2.5 mg by nebulization every 4 (four) hours as needed. asthma     . ESTROGENS CONJUGATED 0.625 MG PO TABS Oral Take 0.625 mg by mouth daily. Take daily for 21 days then do not take for 7 days.     Marland Kitchen FLUTICASONE FUROATE 27.5 MCG/SPRAY NA SUSP Nasal Place 1 spray into the nose daily.      Marland Kitchen FLUTICASONE-SALMETEROL 500-50 MCG/DOSE IN AEPB Inhalation Inhale 1 puff into the lungs every 12 (twelve) hours.      Marland Kitchen HYDROCHLOROTHIAZIDE 12.5 MG PO CAPS Oral Take 12.5 mg by mouth daily.      . IPRATROPIUM BROMIDE 0.02 % IN SOLN Nebulization Take  500 mcg by nebulization every 4 (four) hours as needed. asthma     . LISINOPRIL 40 MG PO TABS Oral Take 40 mg by mouth daily.      Marland Kitchen METFORMIN HCL ER 750 MG PO TB24 Oral Take 750 mg by mouth daily with breakfast.      . NAPROXEN 500 MG PO TABS Oral Take 500 mg by mouth 2 (two) times daily with a meal.      . PANTOPRAZOLE SODIUM 40 MG PO TBEC Oral Take 1 tablet (40 mg total) by mouth daily. 30 tablet 0  . PROMETHAZINE HCL 25 MG PO TABS Oral Take 1 tablet (25 mg total) by mouth every 6 (six) hours as needed for nausea. 12 tablet 0  . SITAGLIPTIN PHOSPHATE 100 MG PO TABS Oral Take 100 mg by mouth daily.      Marland Kitchen ONDANSETRON HCL 4 MG PO TABS Oral Take 1 tablet (4 mg total) by mouth every 6 (six) hours. 12 tablet 0    BP 97/63  Pulse 60  Temp(Src) 98.6 F (37 C) (Oral)  Resp 14  SpO2 98% Vitals reviewed Physical Exam Physical Examination: General appearance - alert, well appearing, and in no  distress Mental status - alert, oriented to person, place, and time Eyes - pupils equal and reactive, no scleral icterus Mouth - mucous membranes moist, pharynx normal without lesions Chest - clear to auscultation, no wheezes, rales or rhonchi, symmetric air entry Heart - normal rate, regular rhythm, normal S1, S2, no murmurs, rubs, clicks or gallops Abdomen - soft, mild epigastric ruq/luq tenderness to palpation, nondistended, no masses or organomegaly Musculoskeletal - no joint tenderness, deformity or swelling Extremities - peripheral pulses normal, no pedal edema, no clubbing or cyanosis Skin - normal coloration and turgor, no rashes  ED Course  Procedures (including critical care time)  Labs Reviewed  URINALYSIS, ROUTINE W REFLEX MICROSCOPIC - Abnormal; Notable for the following:    Specific Gravity, Urine 1.004 (*)    Leukocytes, UA TRACE (*)    All other components within normal limits  CBC - Abnormal; Notable for the following:    Hemoglobin 11.9 (*)    HCT 34.3 (*)    All other components within normal limits  COMPREHENSIVE METABOLIC PANEL - Abnormal; Notable for the following:    Total Bilirubin 0.2 (*)    GFR calc non Af Amer 74 (*)    GFR calc Af Amer 86 (*)    All other components within normal limits  URINALYSIS, ROUTINE W REFLEX MICROSCOPIC - Abnormal; Notable for the following:    Leukocytes, UA TRACE (*)    All other components within normal limits  URINE MICROSCOPIC-ADD ON - Abnormal; Notable for the following:    Squamous Epithelial / LPF MANY (*)    All other components within normal limits  URINE MICROSCOPIC-ADD ON - Abnormal; Notable for the following:    Squamous Epithelial / LPF MANY (*)    Bacteria, UA FEW (*)    All other components within normal limits  LIPASE, BLOOD  POCT PREGNANCY, URINE   No results found.   1. Nausea & vomiting       MDM  Patient presenting with complaint of nausea and vomiting which she states has been ongoing for the  past one week. She has no fever or diarrhea. Her exam is reassuring with very mild tenderness to palpation in her epigastric region. She had a normal CT scan which was obtained during her ED visit yesterday. Her  laboratory workup including urinalysis does not reveal any signs of dehydration. The patient clinically appears nontoxic and well-hydrated. She was given fluids in the ED period she has tolerated oral fluid trial as well in the ED period her symptoms may be related to reflux or gastritis. I have a low suspicion for an acute emergency condition tonight. I've discussed with her the importance of following up with GI and she is working on getting this appointment scheduled. She will be discharged with strict return precautions. She is agreeable with this plan and will continue taking Phenergan as previously prescribed and I will add a prescription for Zofran to aid in her nausea.        Ethelda Chick, MD 03/08/11 [phone]

## 2011-03-12 ENCOUNTER — Encounter (HOSPITAL_COMMUNITY): Payer: Self-pay | Admitting: *Deleted

## 2011-03-12 NOTE — ED Provider Notes (Signed)
Medical screening examination/treatment/procedure(s) were performed by non-physician practitioner and as supervising physician I was immediately available for consultation/collaboration.  Raeford Razor, MD 03/12/11 [phone]

## 2011-04-10 ENCOUNTER — Other Ambulatory Visit: Payer: Self-pay | Admitting: Obstetrics

## 2011-04-10 DIAGNOSIS — Z1231 Encounter for screening mammogram for malignant neoplasm of breast: Secondary | ICD-10-CM

## 2011-04-16 ENCOUNTER — Encounter: Payer: Self-pay | Admitting: Internal Medicine

## 2011-04-16 ENCOUNTER — Telehealth: Payer: Self-pay | Admitting: Internal Medicine

## 2011-04-16 ENCOUNTER — Ambulatory Visit (INDEPENDENT_AMBULATORY_CARE_PROVIDER_SITE_OTHER): Payer: Medicaid Other | Admitting: Internal Medicine

## 2011-04-16 VITALS — BP 110/72 | HR 70 | Temp 98.6°F | Ht 66.0 in | Wt 162.0 lb

## 2011-04-16 DIAGNOSIS — I10 Essential (primary) hypertension: Secondary | ICD-10-CM

## 2011-04-16 DIAGNOSIS — J449 Chronic obstructive pulmonary disease, unspecified: Secondary | ICD-10-CM

## 2011-04-16 DIAGNOSIS — R05 Cough: Secondary | ICD-10-CM

## 2011-04-16 DIAGNOSIS — R059 Cough, unspecified: Secondary | ICD-10-CM

## 2011-04-16 MED ORDER — ALBUTEROL SULFATE (2.5 MG/3ML) 0.083% IN NEBU: 2.5000 mg | INHALATION_SOLUTION | RESPIRATORY_TRACT | Status: DC | PRN

## 2011-04-16 MED ORDER — OLMESARTAN MEDOXOMIL-HCTZ 20-12.5 MG PO TABS: 1.0000 | ORAL_TABLET | Freq: Every day | ORAL | Status: DC

## 2011-04-16 NOTE — Patient Instructions (Addendum)
Protonix 40 mg Take 30-60 min before first meal of the day   Please see patient coordinator before you leave today  to see if [facility] can fill your albuterol for you  Stop lisinopril and advair.  Take benicar 20/12.5(or one half of the 40 mg strength) one daily in place of lisinopril  Only your nebulizers and inhalers as needed - you should see the need gradually go away completely  Please schedule a follow up office visit in 4 weeks, sooner if needed with pft's on return - also due cxr on return

## 2011-04-16 NOTE — Telephone Encounter (Signed)
I spoke with pt and she wanted to know the directions for her benicar. i advised her of MW directions and she voiced her understanding and had no questions

## 2011-04-16 NOTE — Progress Notes (Signed)
  Subjective:    Patient ID: Stacy [name], female    DOB: 23-Oct-1960  MRN: [id number]  HPI  44 yobf quit smoking 2010 due to breathing and some better breathing since quit referred 04/16/2011 for pulmonary eval by Dr Gaynell Face.  04/16/2011 1st pulmonary eval cc indolent onset progressively worse doe  Since 2010 now x one flight of steps and severe dry cough assoc severe hoarsness comes and goes on advair or acei and much worse cold and windy or very hot. No overt sinus or hb symptoms, only a little better with saba, symptoms much worse day than night.   In fact, sleeping ok without nocturnal  or early am exacerbation  of respiratory  c/o's or need for noct saba. Also denies any obvious fluctuation of symptoms with weather or environmental changes or other aggravating or alleviating factors except as outlined above    Review of Systems  Constitutional: Negative for fever, chills and unexpected weight change.  HENT: Positive for sneezing. Negative for ear pain, nosebleeds, congestion, sore throat, rhinorrhea, trouble swallowing, dental problem, voice change, postnasal drip and sinus pressure.   Eyes: Negative for visual disturbance.  Respiratory: Positive for cough and shortness of breath. Negative for choking.   Cardiovascular: Positive for chest pain and leg swelling.  Gastrointestinal: Positive for abdominal pain. Negative for vomiting and diarrhea.  Genitourinary: Negative for difficulty urinating.  Musculoskeletal: Positive for arthralgias.  Skin: Negative for rash.  Neurological: Positive for headaches. Negative for tremors and syncope.  Hematological: Does not bruise/bleed easily.       Objective:   Physical Exam amb extremely hoarse bf nad  Wt 162 HEENT mild turbinate edema.  Oropharynx no thrush or excess pnd or cobblestoning.  No JVD or cervical adenopathy. Mild accessory muscle hypertrophy. Trachea midline, nl thryroid. Chest was mildly  hyperinflated by percussion with slt  diminished breath sounds and mild  increased exp time without wheeze. Hoover sign positive at end inspiration. Regular rate and rhythm without murmur gallop or rub or increase P2 or edema.  Abd: no hsm, nl excursion. Ext warm without cyanosis or clubbing.         Assessment & Plan:

## 2011-04-17 ENCOUNTER — Institutional Professional Consult (permissible substitution): Payer: Medicaid Other | Admitting: Internal Medicine

## 2011-04-17 ENCOUNTER — Telehealth: Payer: Self-pay | Admitting: Internal Medicine

## 2011-04-17 DIAGNOSIS — J449 Chronic obstructive pulmonary disease, unspecified: Secondary | ICD-10-CM | POA: Insufficient documentation

## 2011-04-17 DIAGNOSIS — I10 Essential (primary) hypertension: Secondary | ICD-10-CM | POA: Insufficient documentation

## 2011-04-17 NOTE — Assessment & Plan Note (Signed)
The most common causes of chronic cough in immunocompetent adults include the following: upper airway cough syndrome (UACS), previously referred to as postnasal drip syndrome (PNDS), which is caused by variety of rhinosinus conditions; (2) asthma; (3) GERD; (4) chronic bronchitis from cigarette smoking or other inhaled environmental irritants; (5) nonasthmatic eosinophilic bronchitis; and (6) bronchiectasis.   These conditions, singly or in combination, have accounted for up to 94% of the causes of chronic cough in prospective studies.   Other conditions have constituted no >6% of the causes in prospective studies These have included bronchogenic carcinoma, chronic interstitial pneumonia, sarcoidosis, left ventricular failure, ACEI-induced cough, and aspiration from a condition associated with pharyngeal dysfunction.   .Chronic cough is often simultaneously caused by more than one condition. A single cause has been found from 38 to 82% of the time, multiple causes from 18 to 62%. Multiply caused cough has been the result of three diseases up to 42% of the time.     This is most likley  Classic Upper airway cough syndrome, so named because it's frequently impossible to sort out how much is  CR/sinusitis with freq throat clearing (which can be related to primary GERD)   vs  causing  secondary (" extra esophageal")  GERD from wide swings in gastric pressure that occur with throat clearing, often  promoting self use of mint and menthol lozenges that reduce the lower esophageal sphincter tone and exacerbate the problem further in a cyclical fashion.   These are the same pts (now being labeled as having "irritable larynx syndrome" by some cough centers) who not infrequently have a history of having failed to tolerate ace inhibitors,  dry powder inhalers or biphosphonates or report having atypical reflux symptoms that don't respond to standard doses of PPI , and are easily confused as having aecopd or asthma  flares by even experienced allergists/ pulmonologists.   She is on acei and extremly hoarse to the point of really not being able to speak at all so first step is try off acei then regroup with pft's and go from there

## 2011-04-17 NOTE — Assessment & Plan Note (Signed)
  When respiratory symptoms begin or become refractory well after a patient reports complete smoking cessation,  Especially when this wasn't the case while they were smoking, a red flag is raised based on the work of Dr Primitivo Gauze which states:  if you quit smoking when your best day FEV1 is still well preserved it is highly unlikely you will progress to severe disease.  That is to say, once the smoking stops,  the symptoms should not suddenly erupt or markedly worsen.  If so, the differential diagnosis should include  obesity/deconditioning,  LPR/Reflux/Aspiration syndromes,  occult CHF, or  especially side effect of medications commonly used in this population - like Ace inhibitors  Will ask her to stop acei and then return for baseline cxr pft's then go from there

## 2011-04-17 NOTE — Telephone Encounter (Signed)
LM for Stacy [name]

## 2011-04-17 NOTE — Assessment & Plan Note (Signed)

## 2011-04-20 NOTE — Telephone Encounter (Signed)
Called and spoke with Josie from Dr. Elsie Stain office who stated they don't have any pulm records on pt.  She did state pt was seen by pulmonologist, Dr. Petra Kuba prior to coming to their office so they may have records on pt.  Will forward message to Verlon Au so she can call for records on pt.

## 2011-04-21 NOTE — Telephone Encounter (Signed)
Pt has appt with MW scheduled for 05/25/11- wil have her sign release form at that time so we can get her records. No release was signed at last ov.

## 2011-04-28 ENCOUNTER — Other Ambulatory Visit: Payer: Self-pay | Admitting: Gastroenterology

## 2011-04-28 HISTORY — PX: UPPER GASTROINTESTINAL ENDOSCOPY: SHX188

## 2011-05-08 ENCOUNTER — Emergency Department (HOSPITAL_COMMUNITY)
Admission: EM | Admit: 2011-05-08 | Discharge: 2011-05-09 | Disposition: A | Discharge status: Home / Self Care | Payer: Medicaid Other | Attending: Emergency Medicine | Admitting: Emergency Medicine

## 2011-05-08 ENCOUNTER — Encounter (HOSPITAL_COMMUNITY): Payer: Self-pay | Admitting: Family Medicine

## 2011-05-08 DIAGNOSIS — K219 Gastro-esophageal reflux disease without esophagitis: Secondary | ICD-10-CM | POA: Insufficient documentation

## 2011-05-08 DIAGNOSIS — I10 Essential (primary) hypertension: Secondary | ICD-10-CM | POA: Insufficient documentation

## 2011-05-08 DIAGNOSIS — E119 Type 2 diabetes mellitus without complications: Secondary | ICD-10-CM | POA: Insufficient documentation

## 2011-05-08 DIAGNOSIS — M129 Arthropathy, unspecified: Secondary | ICD-10-CM | POA: Insufficient documentation

## 2011-05-08 DIAGNOSIS — J4489 Other specified chronic obstructive pulmonary disease: Secondary | ICD-10-CM | POA: Insufficient documentation

## 2011-05-08 DIAGNOSIS — R490 Dysphonia: Secondary | ICD-10-CM | POA: Insufficient documentation

## 2011-05-08 DIAGNOSIS — J449 Chronic obstructive pulmonary disease, unspecified: Secondary | ICD-10-CM | POA: Insufficient documentation

## 2011-05-08 DIAGNOSIS — R112 Nausea with vomiting, unspecified: Secondary | ICD-10-CM | POA: Insufficient documentation

## 2011-05-08 DIAGNOSIS — R109 Unspecified abdominal pain: Secondary | ICD-10-CM | POA: Insufficient documentation

## 2011-05-08 DIAGNOSIS — Z9889 Other specified postprocedural states: Secondary | ICD-10-CM | POA: Insufficient documentation

## 2011-05-08 DIAGNOSIS — K589 Irritable bowel syndrome without diarrhea: Secondary | ICD-10-CM | POA: Insufficient documentation

## 2011-05-08 DIAGNOSIS — R1110 Vomiting, unspecified: Secondary | ICD-10-CM

## 2011-05-08 DIAGNOSIS — R197 Diarrhea, unspecified: Secondary | ICD-10-CM | POA: Insufficient documentation

## 2011-05-08 DIAGNOSIS — Z87891 Personal history of nicotine dependence: Secondary | ICD-10-CM | POA: Insufficient documentation

## 2011-05-08 NOTE — ED Notes (Signed)
Patient states that she has been having diarrhea, nausea and vomiting since yesterday.  States she has lost her voice. Also c/o abdominal pain. States she had an endoscopy on 04/28/11 by Dr. Madilyn Fireman but cannot have any pain meds until results are back.

## 2011-05-09 LAB — DIFFERENTIAL
Basophils Absolute: 0 10*3/uL (ref 0.0–0.1)
Basophils Relative: 0 % (ref 0–1)
Eosinophils Absolute: 0.1 10*3/uL (ref 0.0–0.7)
Eosinophils Relative: 1 % (ref 0–5)
Lymphocytes Relative: 56 % — ABNORMAL HIGH (ref 12–46)
Lymphs Abs: 3.9 10*3/uL (ref 0.7–4.0)
Monocytes Absolute: 0.4 10*3/uL (ref 0.1–1.0)
Monocytes Relative: 6 % (ref 3–12)
Neutro Abs: 2.6 10*3/uL (ref 1.7–7.7)
Neutrophils Relative %: 37 % — ABNORMAL LOW (ref 43–77)

## 2011-05-09 LAB — POCT I-STAT, CHEM 8
BUN: 11 mg/dL (ref 6–23)
Calcium, Ion: 1.17 mmol/L (ref 1.12–1.32)
Chloride: 101 mEq/L (ref 96–112)
Creatinine, Ser: 0.9 mg/dL (ref 0.50–1.10)
Glucose, Bld: 101 mg/dL — ABNORMAL HIGH (ref 70–99)
HCT: 41 % (ref 36.0–46.0)
Hemoglobin: 13.9 g/dL (ref 12.0–15.0)
Potassium: 3.2 mEq/L — ABNORMAL LOW (ref 3.5–5.1)
Sodium: 140 mEq/L (ref 135–145)
TCO2: 27 mmol/L (ref 0–100)

## 2011-05-09 LAB — CBC
HCT: 37.9 % (ref 36.0–46.0)
Hemoglobin: 13.3 g/dL (ref 12.0–15.0)
MCH: 28.4 pg (ref 26.0–34.0)
MCHC: 35.1 g/dL (ref 30.0–36.0)
MCV: 80.8 fL (ref 78.0–100.0)
Platelets: 289 10*3/uL (ref 150–400)
RBC: 4.69 MIL/uL (ref 3.87–5.11)
RDW: 13.6 % (ref 11.5–15.5)
WBC: 6.9 10*3/uL (ref 4.0–10.5)

## 2011-05-09 MED ORDER — ONDANSETRON 8 MG PO TBDP: 8.0000 mg | ORAL_TABLET | Freq: Three times a day (TID) | ORAL | Status: AC | PRN

## 2011-05-09 MED ORDER — ONDANSETRON HCL 4 MG/2ML IJ SOLN
4.0000 mg | Freq: Once | INTRAMUSCULAR | Status: AC
  Administered 2011-05-09: 4 mg via INTRAVENOUS
  Filled 2011-05-09: qty 2

## 2011-05-09 MED ORDER — SODIUM CHLORIDE 0.9 % IV BOLUS (SEPSIS)
1000.0000 mL | Freq: Once | INTRAVENOUS | Status: AC
  Administered 2011-05-09: 1000 mL via INTRAVENOUS

## 2011-05-09 NOTE — ED Provider Notes (Signed)
History     CSN: [id number]  Arrival date & time 05/08/11  1955   First MD Initiated Contact with Patient 05/09/11 0030      Chief Complaint  Patient presents with  . Hoarse  . Nausea  . Emesis    (Consider location/radiation/quality/duration/timing/severity/associated sxs/prior treatment) HPI Comments: Patient has chronic recurrent episodes of nausea and vomiting.  She recently had an endoscopy by Dr. Madilyn Fireman results pending.  She has an appointment on Monday for review.  Now reports 2 days, nausea or vomiting, with this episode she ialso has become hoarse and lost her voice  Patient is a 51 y.o. female presenting with vomiting. The history is provided by the patient.  Emesis  This is a recurrent problem. The current episode started yesterday. The problem occurs 5 to 10 times per day. The problem has not changed since onset.The emesis has an appearance of stomach contents. There has been no fever. Associated symptoms include abdominal pain. Pertinent negatives include no chills and no fever.    Past Medical History  Diagnosis Date  . Hypertension     Pt on lisinopril  . GERD (gastroesophageal reflux disease)     Pt on Protonix daily  . Arthritis     Takes Naprosyn   . Diabetes mellitus     Takes Metformin 750 mg  . Hyperlipidemia   . Asthma   . COPD (chronic obstructive pulmonary disease)   . Shortness of breath     occasional - uses breathing tx at home  . Headache     otc meds prn  . Irritable bowel syndrome 11/19/2010    Past Surgical History  Procedure Date  . Hernia repair   . Cholecystectomy   . Svd      x 2  . Tubal ligation   . Endometrial ablation 10/2010  . Upper gastrointestinal endoscopy 04/28/11    Family History  Problem Relation Age of Onset  . Asthma Son     had as a child  . Heart disease Sister   . Breast cancer Sister     History  Substance Use Topics  . Smoking status: Former Smoker -- 1.0 packs/day for 10 years    Types: Cigarettes    Quit date: 09/28/2000  . Smokeless tobacco: Never Used  . Alcohol Use: No    OB History    Grav Para Term Preterm Abortions TAB SAB Ect Mult Living                  Review of Systems  Constitutional: Negative for fever and chills.  Gastrointestinal: Positive for nausea, vomiting and abdominal pain.  Neurological: Negative for dizziness.    Allergies  Other and Aspirin  Home Medications   Current Outpatient Rx  Name Route Sig Dispense Refill  . ALBUTEROL SULFATE (2.5 MG/3ML) 0.083% IN NEBU Nebulization Take 3 mLs (2.5 mg total) by nebulization every 4 (four) hours as needed for wheezing. 75 mL 1  . ALBUTEROL SULFATE HFA 108 (90 BASE) MCG/ACT IN AERS Inhalation Inhale 2 puffs into the lungs every 6 (six) hours as needed. Shortness of breath    . IPRATROPIUM-ALBUTEROL 18-103 MCG/ACT IN AERO Inhalation Inhale 2 puffs into the lungs 4 (four) times daily.    . CO Q 10 PO Oral Take 1 tablet by mouth daily.    . COLCHICINE 0.6 MG PO TABS Oral Take 0.6 mg by mouth daily.    Marland Kitchen ESTROGENS CONJUGATED 0.625 MG PO TABS Oral Take 0.625 mg  by mouth daily. Take daily for 21 days then do not take for 7 days.     Marland Kitchen FLUTICASONE FUROATE 27.5 MCG/SPRAY NA SUSP Nasal Place 1 spray into the nose 2 (two) times daily.     . IPRATROPIUM BROMIDE 0.02 % IN SOLN Nebulization Take 500 mcg by nebulization 4 (four) times daily.    Marland Kitchen METFORMIN HCL ER 750 MG PO TB24 Oral Take 750 mg by mouth daily with breakfast.      . OLMESARTAN MEDOXOMIL-HCTZ 20-12.5 MG PO TABS Oral Take 1 tablet by mouth daily.    Marland Kitchen PANTOPRAZOLE SODIUM 40 MG PO TBEC Oral Take 1 tablet (40 mg total) by mouth daily. 30 tablet 0  . PROMETHAZINE HCL 25 MG PO TABS Oral Take 25 mg by mouth every 4 (four) hours as needed. For nausea    . SITAGLIPTIN PHOSPHATE 100 MG PO TABS Oral Take 100 mg by mouth daily.      Marland Kitchen ONDANSETRON 8 MG PO TBDP Oral Take 1 tablet (8 mg total) by mouth every 8 (eight) hours as needed for nausea. 20 tablet 0    BP 108/71   Pulse 56  Temp 98.6 F (37 C)  Resp 18  Wt 159 lb 8 oz (72.349 kg)  SpO2 94%  Physical Exam  Constitutional: She appears well-developed and well-nourished.  HENT:  Head: Normocephalic.  Eyes: Pupils are equal, round, and reactive to light.  Neck: Normal range of motion.  Cardiovascular: Normal rate.   Abdominal: Bowel sounds are normal. She exhibits no distension. There is no tenderness.  Musculoskeletal: Normal range of motion.  Neurological: She is alert.  Skin: Skin is warm.    ED Course  Procedures (including critical care time)  Labs Reviewed  DIFFERENTIAL - Abnormal; Notable for the following:    Neutrophils Relative 37 (*)    Lymphocytes Relative 56 (*)    All other components within normal limits  POCT I-STAT, CHEM 8 - Abnormal; Notable for the following:    Potassium 3.2 (*)    Glucose, Bld 101 (*)    All other components within normal limits  CBC   No results found.   1. Vomiting and diarrhea   2. Voice hoarseness     After 1 L of IV fluid and IV Zofran.  Patient is feeling much better.  Voice is even stronger  MDM   recurrent episodes of nausea and vomiting.  Will hydrate and medicate for nausea and reevaluate        Arman Filter, NP 05/09/11 1610  Arman Filter, NP 05/09/11 [phone]

## 2011-05-09 NOTE — Discharge Instructions (Signed)
You have been given a prescription for Zofran to control his symptoms of nausea.  Please make sure to follow up with your doctor on Monday try it.  Lately, in the meantimeB.R.A.T. Diet Your doctor has recommended the B.R.A.T. diet for you or your child until the condition improves. This is often used to help control diarrhea and vomiting symptoms. If you or your child can tolerate clear liquids, you may have:  Bananas.   Rice.   Applesauce.   Toast (and other simple starches such as crackers, potatoes, noodles).  Be sure to avoid dairy products, meats, and fatty foods until symptoms are better. Fruit juices such as apple, grape, and prune juice can make diarrhea worse. Avoid these. Continue this diet for 2 days or as instructed by your caregiver. Document Released: 01/19/2005 Document Revised: 01/08/2011 Document Reviewed: 07/08/2006 [facility] Patient Information 2012 [place], [place].

## 2011-05-09 NOTE — ED Provider Notes (Signed)
Medical screening examination/treatment/procedure(s) were performed by non-physician practitioner and as supervising physician I was immediately available for consultation/collaboration.   Gwyneth Sprout, MD 05/09/11 2107

## 2011-05-09 NOTE — ED Notes (Signed)
MD/NP at bedside.

## 2011-05-19 ENCOUNTER — Encounter (HOSPITAL_COMMUNITY): Payer: Self-pay | Admitting: *Deleted

## 2011-05-19 ENCOUNTER — Emergency Department (HOSPITAL_COMMUNITY)
Admission: EM | Admit: 2011-05-19 | Discharge: 2011-05-20 | Disposition: A | Discharge status: Home / Self Care | Payer: Medicaid Other | Attending: Emergency Medicine | Admitting: Emergency Medicine

## 2011-05-19 DIAGNOSIS — R10819 Abdominal tenderness, unspecified site: Secondary | ICD-10-CM | POA: Insufficient documentation

## 2011-05-19 DIAGNOSIS — I10 Essential (primary) hypertension: Secondary | ICD-10-CM | POA: Insufficient documentation

## 2011-05-19 DIAGNOSIS — R197 Diarrhea, unspecified: Secondary | ICD-10-CM | POA: Insufficient documentation

## 2011-05-19 DIAGNOSIS — R109 Unspecified abdominal pain: Secondary | ICD-10-CM | POA: Insufficient documentation

## 2011-05-19 DIAGNOSIS — R112 Nausea with vomiting, unspecified: Secondary | ICD-10-CM | POA: Insufficient documentation

## 2011-05-19 DIAGNOSIS — E119 Type 2 diabetes mellitus without complications: Secondary | ICD-10-CM | POA: Insufficient documentation

## 2011-05-19 LAB — COMPREHENSIVE METABOLIC PANEL
ALT: 11 U/L (ref 0–35)
AST: 18 U/L (ref 0–37)
Albumin: 4.4 g/dL (ref 3.5–5.2)
Alkaline Phosphatase: 72 U/L (ref 39–117)
BUN: 11 mg/dL (ref 6–23)
CO2: 28 mEq/L (ref 19–32)
Calcium: 9.8 mg/dL (ref 8.4–10.5)
Chloride: 97 mEq/L (ref 96–112)
Creatinine, Ser: 0.88 mg/dL (ref 0.50–1.10)
GFR calc Af Amer: 87 mL/min — ABNORMAL LOW (ref >90)
GFR calc non Af Amer: 75 mL/min — ABNORMAL LOW (ref >90)
Glucose, Bld: 86 mg/dL (ref 70–99)
Potassium: 3.4 mEq/L — ABNORMAL LOW (ref 3.5–5.1)
Sodium: 136 mEq/L (ref 135–145)
Total Bilirubin: 0.4 mg/dL (ref 0.3–1.2)
Total Protein: 7.5 g/dL (ref 6.0–8.3)

## 2011-05-19 LAB — CBC
HCT: 36.5 % (ref 36.0–46.0)
Hemoglobin: 12.6 g/dL (ref 12.0–15.0)
MCH: 27.6 pg (ref 26.0–34.0)
MCHC: 34.5 g/dL (ref 30.0–36.0)
MCV: 80 fL (ref 78.0–100.0)
Platelets: 251 10*3/uL (ref 150–400)
RBC: 4.56 MIL/uL (ref 3.87–5.11)
RDW: 13.2 % (ref 11.5–15.5)
WBC: 5.9 10*3/uL (ref 4.0–10.5)

## 2011-05-19 LAB — DIFFERENTIAL
Basophils Absolute: 0 10*3/uL (ref 0.0–0.1)
Basophils Relative: 0 % (ref 0–1)
Eosinophils Absolute: 0.1 10*3/uL (ref 0.0–0.7)
Eosinophils Relative: 1 % (ref 0–5)
Lymphocytes Relative: 61 % — ABNORMAL HIGH (ref 12–46)
Lymphs Abs: 3.6 10*3/uL (ref 0.7–4.0)
Monocytes Absolute: 0.3 10*3/uL (ref 0.1–1.0)
Monocytes Relative: 5 % (ref 3–12)
Neutro Abs: 1.9 10*3/uL (ref 1.7–7.7)
Neutrophils Relative %: 32 % — ABNORMAL LOW (ref 43–77)

## 2011-05-19 MED ORDER — SODIUM CHLORIDE 0.9 % IV BOLUS (SEPSIS)
1000.0000 mL | INTRAVENOUS | Status: AC
  Administered 2011-05-19: 1000 mL via INTRAVENOUS

## 2011-05-19 MED ORDER — LOPERAMIDE HCL 2 MG PO CAPS
ORAL_CAPSULE | ORAL | Status: AC
  Administered 2011-05-20: 4 mg via ORAL
  Filled 2011-05-19: qty 2

## 2011-05-19 MED ORDER — POTASSIUM CHLORIDE CRYS ER 20 MEQ PO TBCR
40.0000 meq | EXTENDED_RELEASE_TABLET | Freq: Once | ORAL | Status: AC
  Administered 2011-05-20: 40 meq via ORAL
  Filled 2011-05-19: qty 2

## 2011-05-19 MED ORDER — LOPERAMIDE HCL 2 MG PO CAPS
4.0000 mg | ORAL_CAPSULE | Freq: Once | ORAL | Status: AC
  Administered 2011-05-19: 4 mg via ORAL
  Filled 2011-05-19: qty 2

## 2011-05-19 MED ORDER — PROMETHAZINE HCL 25 MG/ML IJ SOLN
25.0000 mg | Freq: Four times a day (QID) | INTRAMUSCULAR | Status: DC | PRN
  Administered 2011-05-19: 25 mg via INTRAVENOUS
  Filled 2011-05-19 (×2): qty 1

## 2011-05-19 MED ORDER — PROMETHAZINE HCL 25 MG RE SUPP: 25.0000 mg | Freq: Four times a day (QID) | RECTAL | Status: DC | PRN

## 2011-05-19 MED ORDER — SODIUM CHLORIDE 0.9 % IV SOLN: INTRAVENOUS | Status: DC

## 2011-05-19 MED ORDER — ONDANSETRON HCL 4 MG/2ML IJ SOLN
4.0000 mg | Freq: Once | INTRAMUSCULAR | Status: AC
  Administered 2011-05-19: 4 mg via INTRAVENOUS
  Filled 2011-05-19: qty 2

## 2011-05-19 NOTE — ED Notes (Signed)
Pt sts nausea is still unresolved. Will adm phenergan per prn orders and reassess in order to adm imodium and potassium PO.

## 2011-05-19 NOTE — ED Provider Notes (Signed)
History     CSN: [id number]  Arrival date & time 05/19/11  1617   First MD Initiated Contact with Patient 05/19/11 1757      Chief Complaint  Patient presents with  . Abdominal Pain  . Emesis    (Consider location/radiation/quality/duration/timing/severity/associated sxs/prior treatment) HPI  Patient relates she has had nausea, vomiting, and diarrhea for the past 2 weeks that has been constant. She states she's vomiting and having diarrhea about 4 or 5 times every day. The diarrhea is loose and watery. She denies fever. She has diffuse abdominal pain and states she feels weak and dizzy. She's not been around anybody else is sick. She denies being on recent antibiotics. She relates that she was on Januvia and she saw Dr. Gerome Apley 5 days ago and he stopped her Januvia and had her start taking Flexeril for some low back pain. She was seen yesterday by Margaretmary Bayley her endocrinologist who reported to her her CBG was low at 71 and her blood pressure was low and he started her on Glucophage. She relates she was seen by Dr. Madilyn Fireman gastroenterology and had endoscopy done on March 26 that was negative. She  had the endoscopy to evaluate vomiting and diarrhea. She was placed on Carafate which she states has not helped.  Patient states she was seen by Dr. Sherene Sires recently and he stopped her Atrovent inhaler and just kept her on albuterol inhaler.  PCP Dr Gerome Apley GYN Dr Francoise Ceo GI Dr Madilyn Fireman Endocrinologist Dr Audrie Lia Pulmonologist Dr Sherene Sires  Past Medical History  Diagnosis Date  . Hypertension     Pt on lisinopril  . GERD (gastroesophageal reflux disease)     Pt on Protonix daily  . Arthritis     Takes Naprosyn   . Diabetes mellitus     Takes Metformin 750 mg  . Hyperlipidemia   . Asthma   . COPD (chronic obstructive pulmonary disease)   . Shortness of breath     occasional - uses breathing tx at home  . Headache     otc meds prn  . Irritable bowel syndrome 11/19/2010     Past Surgical History  Procedure Date  . Hernia repair   . Cholecystectomy   . Svd      x 2  . Tubal ligation   . Endometrial ablation 10/2010  . Upper gastrointestinal endoscopy 04/28/11    Family History  Problem Relation Age of Onset  . Asthma Son     had as a child  . Heart disease Sister   . Breast cancer Sister     History  Substance Use Topics  . Smoking status: Former Smoker -- 1.0 packs/day for 10 years    Types: Cigarettes    Quit date: 09/28/2000  . Smokeless tobacco: Never Used  . Alcohol Use: No  lives with spouse  OB History    Grav Para Term Preterm Abortions TAB SAB Ect Mult Living                  Review of Systems  All other systems reviewed and are negative.    Allergies  Other and Aspirin  Home Medications   Current Outpatient Rx  Name Route Sig Dispense Refill  . ALBUTEROL SULFATE (2.5 MG/3ML) 0.083% IN NEBU Nebulization Take 3 mLs (2.5 mg total) by nebulization every 4 (four) hours as needed for wheezing. 75 mL 1  . ALBUTEROL SULFATE HFA 108 (90 BASE) MCG/ACT IN AERS Inhalation Inhale 2  puffs into the lungs every 6 (six) hours as needed. Shortness of breath    . IPRATROPIUM-ALBUTEROL 18-103 MCG/ACT IN AERO Inhalation Inhale 2 puffs into the lungs 4 (four) times daily.    . CO Q 10 PO Oral Take 1 tablet by mouth daily.    . COLCHICINE 0.6 MG PO TABS Oral Take 0.6 mg by mouth daily.    Marland Kitchen ESTROGENS CONJUGATED 0.625 MG PO TABS Oral Take 0.625 mg by mouth daily. Take daily for 21 days then do not take for 7 days.     Marland Kitchen FLUTICASONE FUROATE 27.5 MCG/SPRAY NA SUSP Nasal Place 1 spray into the nose 2 (two) times daily.     . IPRATROPIUM BROMIDE 0.02 % IN SOLN Nebulization Take 500 mcg by nebulization 4 (four) times daily.    Marland Kitchen METFORMIN HCL ER 750 MG PO TB24 Oral Take 750 mg by mouth daily with breakfast.      . OLMESARTAN MEDOXOMIL-HCTZ 20-12.5 MG PO TABS Oral Take 1 tablet by mouth daily.    Marland Kitchen PANTOPRAZOLE SODIUM 40 MG PO TBEC Oral Take 1  tablet (40 mg total) by mouth daily. 30 tablet 0  . PROMETHAZINE HCL 25 MG PO TABS Oral Take 25 mg by mouth every 4 (four) hours as needed. For nausea    . SITAGLIPTIN PHOSPHATE 100 MG PO TABS Oral Take 100 mg by mouth daily.        BP 101/70  Pulse 97  Temp(Src) 99.3 F (37.4 C) (Oral)  Resp 22  Wt 155 lb 2 oz (70.364 kg)  SpO2 98%  Vital signs normal    Physical Exam  Constitutional: She is oriented to person, place, and time. She appears well-developed and well-nourished.  Non-toxic appearance. She does not appear ill. No distress.  HENT:  Head: Normocephalic and atraumatic.  Right Ear: External ear normal.  Left Ear: External ear normal.  Nose: Nose normal. No mucosal edema or rhinorrhea.  Mouth/Throat: Mucous membranes are normal. No dental abscesses or uvula swelling.       Tongue dry  Eyes: Conjunctivae and EOM are normal. Pupils are equal, round, and reactive to light.  Neck: Normal range of motion and full passive range of motion without pain. Neck supple.  Cardiovascular: Normal rate, regular rhythm and normal heart sounds.  Exam reveals no gallop and no friction rub.   No murmur heard. Pulmonary/Chest: Effort normal and breath sounds normal. No respiratory distress. She has no wheezes. She has no rhonchi. She has no rales. She exhibits no tenderness and no crepitus.  Abdominal: Soft. Normal appearance and bowel sounds are normal. She exhibits no distension. There is tenderness. There is no rebound and no guarding.       diffuse  Musculoskeletal: Normal range of motion. She exhibits no edema and no tenderness.       Moves all extremities well.   Neurological: She is alert and oriented to person, place, and time. She has normal strength. No cranial nerve deficit.  Skin: Skin is warm, dry and intact. No rash noted. No erythema. No pallor.  Psychiatric: Her speech is normal and behavior is normal. Her mood appears not anxious.       Flat affect    ED Course  Procedures  (including critical care time)    Medications  0.9 %  sodium chloride infusion (not administered)  promethazine (PHENERGAN) injection 25 mg (25 mg Intravenous Given 05/19/11 2037)  potassium chloride SA (K-DUR,KLOR-CON) CR tablet 40 mEq (not administered)  loperamide (  IMODIUM) 2 MG capsule (not administered)  sodium chloride 0.9 % bolus 1,000 mL (1000 mL Intravenous Given 05/19/11 1948)  loperamide (IMODIUM) capsule 4 mg (4 mg Oral Given 05/19/11 1949)  ondansetron (ZOFRAN) injection 4 mg (4 mg Intravenous Given 05/19/11 1949)   Recheck 23:00 feeling better, no more nausea and no more diarrhea, feels ready to go home.   Results for orders placed during the hospital encounter of 05/19/11  CBC      Component Value Range   WBC 5.9  4.0 - 10.5 (K/uL)   RBC 4.56  3.87 - 5.11 (MIL/uL)   Hemoglobin 12.6  12.0 - 15.0 (g/dL)   HCT 16.1  09.6 - 04.5 (%)   MCV 80.0  78.0 - 100.0 (fL)   MCH 27.6  26.0 - 34.0 (pg)   MCHC 34.5  30.0 - 36.0 (g/dL)   RDW 40.9  81.1 - 91.4 (%)   Platelets 251  150 - 400 (K/uL)  DIFFERENTIAL      Component Value Range   Neutrophils Relative 32 (*) 43 - 77 (%)   Neutro Abs 1.9  1.7 - 7.7 (K/uL)   Lymphocytes Relative 61 (*) 12 - 46 (%)   Lymphs Abs 3.6  0.7 - 4.0 (K/uL)   Monocytes Relative 5  3 - 12 (%)   Monocytes Absolute 0.3  0.1 - 1.0 (K/uL)   Eosinophils Relative 1  0 - 5 (%)   Eosinophils Absolute 0.1  0.0 - 0.7 (K/uL)   Basophils Relative 0  0 - 1 (%)   Basophils Absolute 0.0  0.0 - 0.1 (K/uL)  COMPREHENSIVE METABOLIC PANEL      Component Value Range   Sodium 136  135 - 145 (mEq/L)   Potassium 3.4 (*) 3.5 - 5.1 (mEq/L)   Chloride 97  96 - 112 (mEq/L)   CO2 28  19 - 32 (mEq/L)   Glucose, Bld 86  70 - 99 (mg/dL)   BUN 11  6 - 23 (mg/dL)   Creatinine, Ser 7.82  0.50 - 1.10 (mg/dL)   Calcium 9.8  8.4 - 95.6 (mg/dL)   Total Protein 7.5  6.0 - 8.3 (g/dL)   Albumin 4.4  3.5 - 5.2 (g/dL)   AST 18  0 - 37 (U/L)   ALT 11  0 - 35 (U/L)   Alkaline  Phosphatase 72  39 - 117 (U/L)   Total Bilirubin 0.4  0.3 - 1.2 (mg/dL)   GFR calc non Af Amer 75 (*) >90 (mL/min)   GFR calc Af Amer 87 (*) >90 (mL/min)   Laboratory interpretation all normal except hypokalemia     1. Nausea vomiting and diarrhea    New Prescriptions   PROMETHAZINE (PHENERGAN) 25 MG SUPPOSITORY    Place 1 suppository (25 mg total) rectally every 6 (six) hours as needed for nausea.   Plan discharge Devoria Albe, MD, FACEP    MDM          Ward Givens, MD 05/19/11 [phone]

## 2011-05-19 NOTE — ED Notes (Signed)
Pt states "was seen here for abdominal pain and vomiting about 2 wks ago, I saw Dr. Chestine Spore yesterday and told me my blood sugar was low, I can't keep anything down"

## 2011-05-19 NOTE — ED Notes (Signed)
As soon as pt took imodium caps, she spit them and small amt of water out stating "I can't do it, I can't keep anything down." Lynelle Doctor, EDP made aware, will give zofran more time to work and attempt again.

## 2011-05-19 NOTE — ED Notes (Signed)
In room for rounding and to attempt to give pt ordered po meds. Pt states she still does not think she can keep anything down and does not want to waste the pills. Will inform MD.

## 2011-05-19 NOTE — Discharge Instructions (Signed)
Drink plenty of fluids (clear liquids) the next 12-24 hours then start the BRAT diet. . Use the phenergan for nausea or vomiting. Take imodium OTC for diarrhea. Avoid mild products until the diarrhea is gone. Recheck if you get worse.  

## 2011-05-20 ENCOUNTER — Other Ambulatory Visit (HOSPITAL_COMMUNITY): Payer: Self-pay | Admitting: Gastroenterology

## 2011-05-20 DIAGNOSIS — R112 Nausea with vomiting, unspecified: Secondary | ICD-10-CM

## 2011-05-25 ENCOUNTER — Encounter: Payer: Self-pay | Admitting: Internal Medicine

## 2011-05-25 ENCOUNTER — Ambulatory Visit (INDEPENDENT_AMBULATORY_CARE_PROVIDER_SITE_OTHER)
Admission: RE | Admit: 2011-05-25 | Discharge: 2011-05-25 | Disposition: A | Discharge status: Home / Self Care | Payer: Medicaid Other | Source: Ambulatory Visit | Attending: Internal Medicine | Admitting: Internal Medicine

## 2011-05-25 ENCOUNTER — Ambulatory Visit (INDEPENDENT_AMBULATORY_CARE_PROVIDER_SITE_OTHER): Payer: Medicaid Other | Admitting: Internal Medicine

## 2011-05-25 VITALS — BP 110/74 | HR 64 | Temp 97.8°F | Ht 66.0 in | Wt 159.0 lb

## 2011-05-25 DIAGNOSIS — R059 Cough, unspecified: Secondary | ICD-10-CM

## 2011-05-25 DIAGNOSIS — R05 Cough: Secondary | ICD-10-CM

## 2011-05-25 DIAGNOSIS — J45909 Unspecified asthma, uncomplicated: Secondary | ICD-10-CM

## 2011-05-25 DIAGNOSIS — I10 Essential (primary) hypertension: Secondary | ICD-10-CM

## 2011-05-25 LAB — PULMONARY FUNCTION TEST

## 2011-05-25 MED ORDER — FAMOTIDINE 20 MG PO TABS: ORAL_TABLET | ORAL | Status: DC

## 2011-05-25 MED ORDER — PREDNISONE (PAK) 10 MG PO TABS: ORAL_TABLET | ORAL | Status: AC

## 2011-05-25 MED ORDER — MOMETASONE FURO-FORMOTEROL FUM 100-5 MCG/ACT IN AERO: 2.0000 | INHALATION_SPRAY | Freq: Two times a day (BID) | RESPIRATORY_TRACT | Status: DC

## 2011-05-25 NOTE — Patient Instructions (Signed)
Add pepcid 20 mg one at bedtime  Start dulera 100 Take 2 puffs first thing in am and then another 2 puffs about 12 hours later.   Prednisone 10 mg take  4 each am x 2 days,   2 each am x 2 days,  1 each am x2days and stop   Stop all other inhalers and only nebulizer if you absolutely have to   GERD (REFLUX)  is an extremely common cause of respiratory symptoms, many times with no significant heartburn at all.    It can be treated with medication, but also with lifestyle changes including avoidance of late meals, excessive alcohol, smoking cessation, and avoid fatty foods, chocolate, peppermint, colas, red wine, and acidic juices such as orange juice.  NO MINT OR MENTHOL PRODUCTS SO NO COUGH DROPS  USE SUGARLESS CANDY INSTEAD (jolley ranchers or Stover's)  NO OIL BASED VITAMINS - use powdered substitutes.    See Tammy NP w/in 2 weeks with all your medications, even over the counter meds, separated in two separate bags, the ones you take no matter what vs the ones you stop once you feel better and take only as needed when you feel you need them.   Tammy  will generate for you a new user friendly medication calendar that will put [place] all on the same page re: your medication use.     Without this process, it simply isn't possible to assure that we are providing  your outpatient care  with  the attention to detail we feel you deserve.   If we cannot assure that you're getting that kind of care,  then we cannot manage your problem effectively from this clinic.  Once you have seen Tammy and we are sure that we're all on the same page with your medication use she will arrange follow up with me.

## 2011-05-25 NOTE — Progress Notes (Signed)
PFT done today. 

## 2011-05-25 NOTE — Progress Notes (Signed)
  Subjective:    Patient ID: Stacy [name], female    DOB: [birth year]  MRN: [id number]  HPI  23 yobf quit smoking 2010 due to breathing and some better breathing since quit referred 04/16/2011 for pulmonary eval by Dr Stacy [name].  04/16/2011 1st pulmonary eval cc indolent onset progressively worse doe  Since 2010 now x one flight of steps and severe dry cough assoc severe hoarsness comes and goes on advair or acei and much worse cold and windy or very hot. No overt sinus or hb symptoms, only a little better with saba, symptoms much worse day than night.  rec Protonix 40 mg Take 30-60 min before first meal of the day   Please see patient coordinator before you leave today  to see if advanced can fill your albuterol for you  Stop lisinopril and advair.  Take benicar 20/12.5(or one half of the 40 mg strength) one daily in place of lisinopril  Only your nebulizers and inhalers as needed - you should see the need gradually go away completely  Please schedule a follow up office visit in 4 weeks, sooner if needed with pft's on return - also due cxr on return   05/25/2011 f/u ov/Stacy [name] cc "much better overall" but still using multiple forms of albuterol daily - initially responded neeed it 2-3 times since last ov, but took 3 different forms before coming to office on day of ov.  No purulent sputum or nasal complaints, still quite hoarse/ no overt HB but now on Tums plus prilosec per Dr Stacy [name].  Sleeping ok without nocturnal  or early am exacerbation  of respiratory  c/o's or need for noct saba. Also denies any obvious fluctuation of symptoms with weather or environmental changes or other aggravating or alleviating factors except as outlined above   ROS  At present neg for  any significant sore throat, dysphagia, dental problems, itching, sneezing,  nasal congestion or excess/ purulent secretions, ear ache,   fever, chills, sweats, unintended wt loss, pleuritic or exertional cp, hemoptysis, palpitations,  orthopnea pnd or leg swelling.  Also denies presyncope, palpitations, heartburn, abdominal pain, anorexia, nausea, vomiting, diarrhea  or change in bowel or urinary habits, change in stools or urine, dysuria,hematuria,  rash, arthralgias, visual complaints, headache, numbness weakness or ataxia or problems with walking or coordination. No noted change in mood/affect or memory.                          Objective:   Physical Exam amb extremely hoarse bf nad  Wt 162 04/16/11 > 159 05/25/2011  HEENT mild turbinate edema.  Oropharynx no thrush or excess pnd or cobblestoning.  No JVD or cervical adenopathy. Mild accessory muscle hypertrophy. Trachea midline, nl thryroid. Chest was mildly  hyperinflated by percussion with slt diminished breath sounds and mild  increased exp time without wheeze. Hoover sign positive at end inspiration. Regular rate and rhythm without murmur gallop or rub or increase P2 or edema.  Abd: no hsm, nl excursion. Ext warm without cyanosis or clubbing.     05/25/11 Trachea is midline. Heart size normal. Suspect mild  emphysematous changes at the lung apices, right greater than left.  Lungs are mildly hyperinflated but otherwise clear     Assessment & Plan:

## 2011-05-25 NOTE — Assessment & Plan Note (Signed)
Adequate control on present rx, reviewed need to continue off acei

## 2011-05-25 NOTE — Assessment & Plan Note (Addendum)
-   PFTs 05/25/2011 FEV1  2.75 (103%) ratio 73 and DLC0 58 corrects to 67% c/w GOLD 0  Symptoms are markedly disproportionate to objective findings and not clear this is a lung problem but pt does appear to have difficult airway management issues. DDX of  difficult airways managment all start with A and  include Adherence, Ace Inhibitors, Acid Reflux, Active Sinus Disease, Alpha 1 Antitripsin deficiency, Anxiety masquerading as Airways dz,  ABPA,  allergy(esp in young), Aspiration (esp in elderly), Adverse effects of DPI,  Active smokers, plus two Bs  = Bronchiectasis and Beta blocker use..and one C= CHF  In this case Adherence is the biggest issue and starts with  inability to use HFA effectively and also  understand that SABA treats the symptoms but doesn't get to the underlying problem (inflammation).  I used  the analogy of putting steroid cream on a rash to help explain the meaning of topical therapy and the need to get the drug to the target tissue.  Try dulera 100 2bid  The proper method of use, as well as anticipated side effects, of a metered-dose inhaler are discussed and demonstrated to the patient. Improved effectiveness after extensive coaching during this visit to a level of approximately  90%  ? Still acei effect > continue off  ? Acid reflux > try adding pepcid 20 mg at bedtime plus reviewed diet.

## 2011-06-01 ENCOUNTER — Encounter (HOSPITAL_COMMUNITY)
Admission: RE | Admit: 2011-06-01 | Discharge: 2011-06-01 | Disposition: A | Discharge status: Home / Self Care | Payer: Medicaid Other | Source: Ambulatory Visit | Attending: Gastroenterology | Admitting: Gastroenterology

## 2011-06-01 ENCOUNTER — Encounter (HOSPITAL_COMMUNITY): Payer: Self-pay

## 2011-06-01 DIAGNOSIS — R112 Nausea with vomiting, unspecified: Secondary | ICD-10-CM | POA: Insufficient documentation

## 2011-06-01 MED ORDER — TECHNETIUM TC 99M SULFUR COLLOID
2.2000 | Freq: Once | INTRAVENOUS | Status: AC | PRN
  Administered 2011-06-01: 2.2 via INTRAVENOUS

## 2011-06-09 ENCOUNTER — Encounter: Payer: Self-pay | Admitting: Adult Health

## 2011-06-09 ENCOUNTER — Encounter: Payer: Medicaid Other | Admitting: Adult Health

## 2011-06-09 ENCOUNTER — Ambulatory Visit (INDEPENDENT_AMBULATORY_CARE_PROVIDER_SITE_OTHER): Payer: Medicaid Other | Admitting: Adult Health

## 2011-06-09 VITALS — BP 114/76 | HR 100 | Temp 98.5°F | Ht 63.0 in | Wt 163.6 lb

## 2011-06-09 DIAGNOSIS — J449 Chronic obstructive pulmonary disease, unspecified: Secondary | ICD-10-CM

## 2011-06-09 NOTE — Patient Instructions (Signed)
Follow med calendar closely and bring to each visit.  Follow up with Dr. Wert  In 6 weeks and As needed   

## 2011-06-09 NOTE — Progress Notes (Signed)
Subjective:    Patient ID: Stacy [name], female    DOB: [birth year]  MRN: [id number]  HPI  21 yobf quit smoking 2010 due to breathing and some better breathing since quit referred 04/16/2011 for pulmonary eval by Dr Gaynell Face.  04/16/2011 1st pulmonary eval cc indolent onset progressively worse doe  Since 2010 now x one flight of steps and severe dry cough assoc severe hoarsness comes and goes on advair or acei and much worse cold and windy or very hot. No overt sinus or hb symptoms, only a little better with saba, symptoms much worse day than night.  rec Protonix 40 mg Take 30-60 min before first meal of the day   Please see patient coordinator before you leave today  to see if [facility] can fill your albuterol for you  Stop lisinopril and advair.  Take benicar 20/12.5(or one half of the 40 mg strength) one daily in place of lisinopril  Only your nebulizers and inhalers as needed - you should see the need gradually go away completely  Please schedule a follow up office visit in 4 weeks, sooner if needed with pft's on return - also due cxr on return   05/25/2011 f/u ov/Wert cc "much better overall" but still using multiple forms of albuterol daily - initially responded neeed it 2-3 times since last ov, but took 3 different forms before coming to office on day of ov.  No purulent sputum or nasal complaints, still quite hoarse/ no overt HB but now on Tums plus prilosec per Dr Madilyn Fireman. >>added  pepcid 20 mg one at bedtime, Dulera 100  Twice daily  And steroid taper    06/09/2011 follow up and med review. Patient returns for a followup visit and medication review. We reviewed all her medications and organized them into a medication count with patient education. It appears that she is taking her medications correctly  She says she is feeling better. Last ov Dulera added along with pepcid and steorid taper .  She is feeling better w/ less cough and dyspnea.          ROS:  Constitutional:    No  weight loss, night sweats,  Fevers, chills,  +fatigue, or  lassitude.  HEENT:   No headaches,  Difficulty swallowing,  Tooth/dental problems, or  Sore throat,                No sneezing, itching, ear ache, nasal congestion, post nasal drip,   CV:  No chest pain,  Orthopnea, PND, swelling in lower extremities, anasarca, dizziness, palpitations, syncope.   GI  No heartburn, indigestion, abdominal pain, nausea, vomiting, diarrhea, change in bowel habits, loss of appetite, bloody stools.   Resp:    No coughing up of blood.  No change in color of mucus.  No wheezing.  No chest wall deformity  Skin: no rash or lesions.  GU: no dysuria, change in color of urine, no urgency or frequency.  No flank pain, no hematuria   MS:  No joint pain or swelling.  No decreased range of motion.  No back pain.  Psych:  No change in mood or affect. No depression or anxiety.  No memory loss.                             Objective:   Physical Exam amb extremely hoarse bf nad  Wt 162 04/16/11 > 159 05/25/2011 >>163 06/09/2011  HEENT mild turbinate edema.  Oropharynx no thrush or excess pnd or cobblestoning.  No JVD or cervical adenopathy. Mild accessory muscle hypertrophy. Trachea midline, nl thryroid. Chest was mildly  hyperinflated by percussion with slt diminished breath sounds  . Regular rate and rhythm without murmur gallop or rub or increase P2 or edema.  Abd: no hsm, nl excursion. Ext warm without cyanosis or clubbing.     05/25/11 Trachea is midline. Heart size normal. Suspect mild  emphysematous changes at the lung apices, right greater than left.  Lungs are mildly hyperinflated but otherwise clear     Assessment & Plan:

## 2011-06-09 NOTE — Assessment & Plan Note (Signed)
Improved compensation with resolution of cough off ACe inhibitor  Patient's medications were reviewed today and patient education was given. Computerized medication calendar was adjusted/completed  follow up Dr. Sherene Sires  In 6 weeks

## 2011-06-11 NOTE — Progress Notes (Signed)
Addended by: Boone Master E on: 06/11/2011 04:03 PM   Modules accepted: Orders, Medications

## 2011-06-19 ENCOUNTER — Encounter (HOSPITAL_COMMUNITY): Payer: Self-pay

## 2011-06-19 ENCOUNTER — Ambulatory Visit (HOSPITAL_COMMUNITY): Admit: 2011-06-19 | Payer: Self-pay | Admitting: Gastroenterology

## 2011-06-19 SURGERY — COLONOSCOPY
Anesthesia: Moderate Sedation

## 2011-06-24 ENCOUNTER — Ambulatory Visit
Admission: RE | Admit: 2011-06-24 | Discharge: 2011-06-24 | Disposition: A | Discharge status: Home / Self Care | Payer: Medicaid Other | Source: Ambulatory Visit | Attending: Obstetrics | Admitting: Obstetrics

## 2011-06-24 DIAGNOSIS — Z1231 Encounter for screening mammogram for malignant neoplasm of breast: Secondary | ICD-10-CM

## 2011-06-25 ENCOUNTER — Other Ambulatory Visit: Payer: Self-pay | Admitting: Obstetrics

## 2011-06-25 DIAGNOSIS — N6452 Nipple discharge: Secondary | ICD-10-CM

## 2011-07-01 ENCOUNTER — Telehealth: Payer: Self-pay | Admitting: Internal Medicine

## 2011-07-01 MED ORDER — VALSARTAN-HYDROCHLOROTHIAZIDE 160-12.5 MG PO TABS: 1.0000 | ORAL_TABLET | Freq: Every day | ORAL | Status: DC

## 2011-07-01 NOTE — Telephone Encounter (Signed)
I spoke with pt and is aware of medication change and directions. rx has been sent to the pharmacy

## 2011-07-01 NOTE — Telephone Encounter (Signed)
I spoke with pt and she states that her medicaid is not going to cover her benicar any longer. She states they did not give her alternatives and was told she needed to contact her physician for this. I called the pharmacy for alternatives and was told they did not have this rx on file for pt so they did not know what they will cover. Please advise Dr. Sherene Sires, thanks  Allergies  Allergen Reactions  . Other Shortness Of Breath    Allergic to perfumes and cleaning products  . Aspirin Nausea Only    unknown

## 2011-07-01 NOTE — Telephone Encounter (Signed)
Ok to substitute generic diovan 160/12.5 one daily

## 2011-07-09 ENCOUNTER — Other Ambulatory Visit: Payer: Medicaid Other

## 2011-07-12 ENCOUNTER — Emergency Department (HOSPITAL_COMMUNITY)
Admission: EM | Admit: 2011-07-12 | Discharge: 2011-07-12 | Disposition: A | Discharge status: Home / Self Care | Payer: Medicaid Other | Source: Home / Self Care | Attending: Family Medicine | Admitting: Family Medicine

## 2011-07-12 ENCOUNTER — Encounter (HOSPITAL_COMMUNITY): Payer: Self-pay

## 2011-07-12 DIAGNOSIS — N390 Urinary tract infection, site not specified: Secondary | ICD-10-CM

## 2011-07-12 LAB — POCT URINALYSIS DIP (DEVICE)
Bilirubin Urine: NEGATIVE
Glucose, UA: NEGATIVE mg/dL
Ketones, ur: NEGATIVE mg/dL
Nitrite: POSITIVE — AB
Protein, ur: >=300 mg/dL — AB
Specific Gravity, Urine: 1.025 (ref 1.005–1.030)
Urobilinogen, UA: 0.2 mg/dL (ref 0.0–1.0)
pH: 6.5 (ref 5.0–8.0)

## 2011-07-12 LAB — GLUCOSE, CAPILLARY: Glucose-Capillary: 159 mg/dL — ABNORMAL HIGH (ref 70–99)

## 2011-07-12 MED ORDER — CEFTRIAXONE SODIUM 1 G IJ SOLR
INTRAMUSCULAR | Status: AC
  Filled 2011-07-12: qty 10

## 2011-07-12 MED ORDER — LIDOCAINE HCL (PF) 1 % IJ SOLN
INTRAMUSCULAR | Status: AC
  Filled 2011-07-12: qty 5

## 2011-07-12 MED ORDER — ACETAMINOPHEN-CODEINE #3 300-30 MG PO TABS: 1.0000 | ORAL_TABLET | Freq: Four times a day (QID) | ORAL | Status: DC | PRN

## 2011-07-12 MED ORDER — CEPHALEXIN 500 MG PO CAPS: 500.0000 mg | ORAL_CAPSULE | Freq: Three times a day (TID) | ORAL | Status: DC

## 2011-07-12 MED ORDER — ONDANSETRON 4 MG PO TBDP: 4.0000 mg | ORAL_TABLET | Freq: Three times a day (TID) | ORAL | Status: AC | PRN

## 2011-07-12 MED ORDER — CEFTRIAXONE SODIUM 1 G IJ SOLR
1.0000 g | Freq: Once | INTRAMUSCULAR | Status: AC
  Administered 2011-07-12: 1 g via INTRAMUSCULAR

## 2011-07-12 MED ORDER — ONDANSETRON 4 MG PO TBDP
ORAL_TABLET | ORAL | Status: AC
  Filled 2011-07-12: qty 1

## 2011-07-12 MED ORDER — ONDANSETRON 4 MG PO TBDP
4.0000 mg | ORAL_TABLET | Freq: Once | ORAL | Status: AC
  Administered 2011-07-12: 4 mg via ORAL

## 2011-07-12 NOTE — Discharge Instructions (Signed)
You do have a urinary tract infection.  Take the prescribed medications as instructed and keep well-hydrated with none sugary drinks. Today we administered an antibiotic injection but you need to start the prescribed medications later today. Urinary infections can be dangerous and life-threatening if they spread in a diabetic patient. I want you to followup with your primary care provider in 48-hour is for recheck. We have a urine culture pending and results can be made available for your primary care provider in your next visit. Go to the emergency department if new onset of fever or worsening symptoms despite following treatment.Urinary Tract Infection Infections of the urinary tract can start in several places. A bladder infection (cystitis), a kidney infection (pyelonephritis), and a prostate infection (prostatitis) are different types of urinary tract infections (UTIs). They usually get better if treated with medicines (antibiotics) that kill germs. Take all the medicine until it is gone. You or your child may feel better in a few days, but TAKE ALL MEDICINE or the infection may not respond and may become more difficult to treat. HOME CARE INSTRUCTIONS   Drink enough water and fluids to keep the urine clear or pale yellow. Cranberry juice is especially recommended, in addition to large amounts of water.   Avoid caffeine, tea, and carbonated beverages. They tend to irritate the bladder.   Alcohol may irritate the prostate.   Only take over-the-counter or prescription medicines for pain, discomfort, or fever as directed by your caregiver.  To prevent further infections:  Empty the bladder often. Avoid holding urine for long periods of time.   After a bowel movement, women should cleanse from front to back. Use each tissue only once.   Empty the bladder before and after sexual intercourse.  FINDING OUT THE RESULTS OF YOUR TEST Not all test results are available during your visit. If your or  your child's test results are not back during the visit, make an appointment with your caregiver to find out the results. Do not assume everything is normal if you have not heard from your caregiver or the medical facility. It is important for you to follow up on all test results. SEEK MEDICAL CARE IF:   There is back pain.   Your baby is older than 3 months with a rectal temperature of 100.5 F (38.1 C) or higher for more than 1 day.   Your or your child's problems (symptoms) are no better in 3 days. Return sooner if you or your child is getting worse.  SEEK IMMEDIATE MEDICAL CARE IF:   There is severe back pain or lower abdominal pain.   You or your child develops chills.   You have a fever.   Your baby is older than 3 months with a rectal temperature of 102 F (38.9 C) or higher.   Your baby is 67 months old or younger with a rectal temperature of 100.4 F (38 C) or higher.   There is nausea or vomiting.   There is continued burning or discomfort with urination.  MAKE SURE YOU:   Understand these instructions.   Will watch your condition.   Will get help right away if you are not doing well or get worse.  Document Released: 10/29/2004 Document Revised: 01/08/2011 Document Reviewed: 06/03/2006 [facility] Patient Information 2012 [place], [place].

## 2011-07-12 NOTE — ED Notes (Signed)
Pt has had painful urination since yesterday and has history of GI problems and has had n/v also.

## 2011-07-12 NOTE — ED Provider Notes (Signed)
History     CSN: [id number]  Arrival date & time 07/12/11  1109   First MD Initiated Contact with Patient 07/12/11 1122      Chief Complaint  Patient presents with  . Urinary Tract Infection    (Consider location/radiation/quality/duration/timing/severity/associated sxs/prior treatment) HPI Comments: 51 year old diabetic female here complaining of low abdominal pain and burning on urination for 2 days. Symptoms have been associated with nausea and one episode of emesis early this morning. Decreased appetite. No fever or chills. Had last urinary tract infection in December 2012. She does have a history of irritable bowel syndrome and nausea vomiting and diarrhea are common frequent symptoms for her. Denies headache. Has not taken any pain or fever medications today temperature here is 98.8 F.    Past Medical History  Diagnosis Date  . Hypertension     Pt on lisinopril  . GERD (gastroesophageal reflux disease)     Pt on Protonix daily  . Arthritis     Takes Naprosyn   . Diabetes mellitus     Takes Metformin 750 mg  . Hyperlipidemia   . Asthma   . COPD (chronic obstructive pulmonary disease)   . Shortness of breath     occasional - uses breathing tx at home  . Headache     otc meds prn  . Irritable bowel syndrome 11/19/2010    Past Surgical History  Procedure Date  . Hernia repair   . Cholecystectomy   . Svd      x 2  . Tubal ligation   . Endometrial ablation 10/2010  . Upper gastrointestinal endoscopy 04/28/11    Family History  Problem Relation Age of Onset  . Asthma Son     had as a child  . Heart disease Sister   . Breast cancer Sister     History  Substance Use Topics  . Smoking status: Former Smoker -- 1.0 packs/day for 10 years    Types: Cigarettes    Quit date: 09/28/2000  . Smokeless tobacco: Never Used  . Alcohol Use: No    OB History    Grav Para Term Preterm Abortions TAB SAB Ect Mult Living                  Review of Systems    Constitutional: Negative for fever and chills.  Gastrointestinal: Positive for nausea and vomiting. Negative for diarrhea, constipation and abdominal distention.       Low abdominal pain  Genitourinary: Positive for dysuria and frequency. Negative for hematuria, flank pain, vaginal bleeding, vaginal discharge and pelvic pain.  Musculoskeletal: Negative for back pain.  Skin: Negative for rash.  Neurological: Negative for dizziness and headaches.    Allergies  Other and Aspirin  Home Medications   Current Outpatient Rx  Name Route Sig Dispense Refill  . ACETAMINOPHEN-CODEINE #3 300-30 MG PO TABS Oral Take 1-2 tablets by mouth every 6 (six) hours as needed for pain. 15 tablet 0  . ALBUTEROL SULFATE (2.5 MG/3ML) 0.083% IN NEBU Nebulization Take 3 mLs (2.5 mg total) by nebulization every 4 (four) hours as needed for wheezing. 75 mL 1  . ALBUTEROL SULFATE HFA 108 (90 BASE) MCG/ACT IN AERS Inhalation Inhale 2 puffs into the lungs every 6 (six) hours as needed. Shortness of breath    . CEPHALEXIN 500 MG PO CAPS Oral Take 1 capsule (500 mg total) by mouth 3 (three) times daily. 21 capsule 0  . COLCHICINE 0.6 MG PO TABS Oral Take 0.6 mg  by mouth daily.    . CYCLOBENZAPRINE HCL 5 MG PO TABS Oral Take 5 mg by mouth 3 (three) times daily.     Marland Kitchen ESTROGENS CONJUGATED 0.625 MG PO TABS Oral Take 0.625 mg by mouth daily. Take daily for 21 days then do not take for 7 days.     Marland Kitchen FAMOTIDINE 20 MG PO TABS  One at bedtime 30 tablet 11  . FLUTICASONE FUROATE 27.5 MCG/SPRAY NA SUSP Nasal Place 2 sprays into the nose 2 (two) times daily.     Marland Kitchen GABAPENTIN 300 MG PO CAPS Oral Take 300 mg by mouth 3 (three) times daily.    Marland Kitchen HYDROCODONE-ACETAMINOPHEN 5-325 MG PO TABS Oral Take 1 tablet by mouth 2 (two) times daily as needed.    Marland Kitchen HYDROXYZINE HCL 10 MG PO TABS Oral Take 10 mg by mouth at bedtime.    Marland Kitchen METFORMIN HCL ER (MOD) 500 MG PO TB24 Oral Take 500 mg by mouth daily with breakfast.    . MOMETASONE  FURO-FORMOTEROL FUM 100-5 MCG/ACT IN AERO Inhalation Inhale 2 puffs into the lungs 2 (two) times daily.    Marland Kitchen ONDANSETRON 4 MG PO TBDP Oral Take 1 tablet (4 mg total) by mouth every 8 (eight) hours as needed for nausea. 10 tablet 0  . PANTOPRAZOLE SODIUM 40 MG PO TBEC Oral Take 1 tablet (40 mg total) by mouth daily. 30 tablet 0  . PROMETHAZINE HCL 25 MG RE SUPP Rectal Place 1 suppository (25 mg total) rectally every 6 (six) hours as needed for nausea. 12 each 0  . PROMETHAZINE HCL 25 MG PO TABS Oral Take 1 tablet (25 mg total) by mouth every 6 (six) hours as needed for nausea. 12 tablet 0  . SUCRALFATE 1 G PO TABS Oral Take 1 g by mouth 3 (three) times daily.    Marland Kitchen VALSARTAN-HYDROCHLOROTHIAZIDE 160-12.5 MG PO TABS Oral Take 1 tablet by mouth daily. 30 tablet 5    BP 144/90  Pulse 95  Temp(Src) 98.8 F (37.1 C) (Oral)  Resp 16  SpO2 98%  Physical Exam  Nursing note and vitals reviewed. Constitutional: She is oriented to person, place, and time. She appears well-developed and well-nourished. No distress.  HENT:  Head: Normocephalic and atraumatic.  Eyes: Conjunctivae are normal. No scleral icterus.  Cardiovascular: Normal heart sounds.   Pulmonary/Chest: Breath sounds normal.  Abdominal: Soft. Bowel sounds are normal. She exhibits no mass. There is no rebound and no guarding.       Suprapubic tenderness to deep palpation. Otherwise normal abdominal exam. No CVT  Neurological: She is alert and oriented to person, place, and time.  Skin: No rash noted.    ED Course  Procedures (including critical care time)  Labs Reviewed  POCT URINALYSIS DIP (DEVICE) - Abnormal; Notable for the following:    Hgb urine dipstick LARGE (*)    Protein, ur >=300 (*)    Nitrite POSITIVE (*)    Leukocytes, UA LARGE (*) Biochemical Testing Only. Please order routine urinalysis from main lab if confirmatory testing is needed.   All other components within normal limits  GLUCOSE, CAPILLARY - Abnormal;  Notable for the following:    Glucose-Capillary 159 (*)    All other components within normal limits  URINE CULTURE      1. Acute lower UTI (urinary tract infection)       MDM  Diabetic with symptoms and findings consistent with low urinary track infection for 2 days. 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 Afebrile. Administered rocephin 1g IM here today.  Prescribed keflex. Asked to be reckecked in 24-48 hours or return if new onset of fever or worsening symptoms despite following treatment. Urine culture pending.         Sharin Grave, MD 07/13/11 1150

## 2011-07-13 ENCOUNTER — Other Ambulatory Visit: Payer: Self-pay | Admitting: Obstetrics

## 2011-07-13 ENCOUNTER — Ambulatory Visit
Admission: RE | Admit: 2011-07-13 | Discharge: 2011-07-13 | Disposition: A | Discharge status: Home / Self Care | Payer: Medicaid Other | Source: Ambulatory Visit | Attending: Obstetrics | Admitting: Obstetrics

## 2011-07-13 DIAGNOSIS — N6452 Nipple discharge: Secondary | ICD-10-CM

## 2011-07-14 LAB — URINE CULTURE
Colony Count: >=100000
Culture  Setup Time: 201306091824

## 2011-07-14 NOTE — ED Notes (Signed)
Urine culture: >100,000 colonies E. Coli.  Pt. adequately treated with Keflex. Vassie Moselle 07/14/2011

## 2011-07-22 ENCOUNTER — Ambulatory Visit (INDEPENDENT_AMBULATORY_CARE_PROVIDER_SITE_OTHER): Payer: Medicaid Other | Admitting: Internal Medicine

## 2011-07-22 ENCOUNTER — Encounter: Payer: Self-pay | Admitting: Internal Medicine

## 2011-07-22 VITALS — BP 122/80 | HR 67 | Temp 98.2°F | Ht 66.0 in | Wt 167.0 lb

## 2011-07-22 DIAGNOSIS — J449 Chronic obstructive pulmonary disease, unspecified: Secondary | ICD-10-CM

## 2011-07-22 DIAGNOSIS — I10 Essential (primary) hypertension: Secondary | ICD-10-CM

## 2011-07-22 MED ORDER — MOMETASONE FURO-FORMOTEROL FUM 100-5 MCG/ACT IN AERO: 2.0000 | INHALATION_SPRAY | Freq: Two times a day (BID) | RESPIRATORY_TRACT | Status: DC

## 2011-07-22 NOTE — Assessment & Plan Note (Signed)
Changed from acei to arb 04/17/2011 due to hoarsness ? Pseudoasthma > resolved  ACE inhibitors are problematic in  pts with airway complaints because  even experienced pulmonologists can't always distinguish ace effects from copd/asthma.  By themselves they don't actually cause a problem, much like oxygen can't by itself start a fire, but they certainly serve as a powerful catalyst or enhancer for any "fire"  or inflammatory process in the upper airway, be it caused by an ET  tube or more commonly reflux (especially in the obese or pts with known GERD or who are on biphoshonates).    In the era of ARB near equivalency until we have a better handle on the reversibility of the airway problem, it just makes sense to avoid ACEI  entirely in the short run and then decide later, having established a level of airway control using a reasonable limited regimen, whether to add back ace but even then being very careful to observe the pt for worsening airway control and number of meds used/ needed to control symptoms.  Note she no longer needs any pulmonary rx at all so I would avoid acei indefinitely.

## 2011-07-22 NOTE — Progress Notes (Signed)
Subjective:    Patient ID: Stacy [name], female    DOB: 05-28-1960  MRN: [id number]  HPI  24 yobf quit smoking 2010 due to breathing and some better breathing since quit referred 04/16/2011 for pulmonary eval by Dr Gaynell Face.  04/16/2011 1st pulmonary eval cc indolent onset progressively worse doe  Since 2010 now x one flight of steps and severe dry cough assoc severe hoarsness comes and goes on advair or acei and much worse cold and windy or very hot. No overt sinus or hb symptoms, only a little better with saba, symptoms much worse day than night.  rec Protonix 40 mg Take 30-60 min before first meal of the day  Please see patient coordinator before you leave today  to see if advanced can fill your albuterol for you Stop lisinopril and advair. Take benicar 20/12.5(or one half of the 40 mg strength) one daily in place of lisinopril Only your nebulizers and inhalers as needed - you should see the need gradually go away completely Please schedule a follow up office visit in 4 weeks, sooner if needed with pft's on return - also due cxr on return   05/25/2011 f/u ov/Stacy [name] cc "much better overall" but still using multiple forms of albuterol daily - initially responded neeed it 2-3 times since last ov, but took 3 different forms before coming to office on day of ov.  No purulent sputum or nasal complaints, still quite hoarse/ no overt HB but now on Tums plus prilosec per Dr Madilyn Fireman. >>added  pepcid 20 mg one at bedtime, Dulera 100  Twice daily  And steroid taper    06/09/2011 follow up and med review. Patient returns for a followup visit and medication review. We reviewed all her medications and organized them into a medication count with patient education. It appears that she is taking her medications correctly  She says she is feeling better. Last ov Dulera added along with pepcid and steorid taper .  She is feeling better w/ less cough and dyspnea.  rec Follow med calendar closely and bring to each  visit  07/22/2011 f/u ov/Stacy [name] breathing is fine, not limiting desired activities, no cough. Does occ note sob p 2 flights - no use of saba at all now  Sleeping ok without nocturnal  or early am exacerbation  of respiratory  c/o's or need for noct saba. Also denies any obvious fluctuation of symptoms with weather or environmental changes or other aggravating or alleviating factors except as outlined above   ROS  At present neg for  any significant sore throat, dysphagia, dental problems, itching, sneezing,  nasal congestion or excess/ purulent secretions, ear ache,   fever, chills, sweats, unintended wt loss, pleuritic or exertional cp, hemoptysis, palpitations, orthopnea pnd or leg swelling.  Also denies presyncope, palpitations, heartburn, abdominal pain, anorexia, nausea, vomiting, diarrhea  or change in bowel or urinary habits, change in stools or urine, dysuria,hematuria,  rash, arthralgias, visual complaints, headache, numbness weakness or ataxia or problems with walking or coordination. No noted change in mood/affect or memory.            Objective:   Physical Exam amb  bf nad  All smiles  Wt 162 04/16/11 > 159 05/25/2011 >>163 06/09/2011 > 07/22/2011   HEENT: nl dentition, turbinates, and orophanx. Nl external ear canals without cough reflex   NECK :  without JVD/Nodes/TM/ nl carotid upstrokes bilaterally   LUNGS: no acc muscle use, clear to A and P bilaterally without cough on insp  or exp maneuvers   CV:  RRR  no s3 or murmur or increase in P2, no edema   ABD:  soft and nontender with nl excursion in the supine position. No bruits or organomegaly, bowel sounds nl  MS:  warm without deformities, calf tenderness, cyanosis or clubbing  SKIN: warm and dry without lesions    NEURO:  alert, approp, no deficits     05/25/11 Trachea is midline. Heart size normal. Suspect mild  emphysematous changes at the lung apices, right greater than left.  Lungs are mildly hyperinflated but  otherwise clear     Assessment & Plan:

## 2011-07-22 NOTE — Assessment & Plan Note (Addendum)
-   PFTs 05/25/2011 FEV1  2.75 (103%) ratio 73 and DLC0 58 corrects to 67% c/w GOLD 0   - HFA 90% p coaching 05/25/2011     -med calendar 06/09/2011   She really does not have significant copd and probably can just use dulera if symptoms recur at this point  Note she went from refractory symptoms to no symptoms off acei so these should be permanently avoided.

## 2011-07-22 NOTE — Patient Instructions (Addendum)
Change dulera to 2 puffs every 12 hours if needed for short of breath/ cough / congestion/ wheezing   If you are satisfied with your treatment plan let your doctor know and he/she can either refill your medications or you can return here when your prescription runs out.     If in any way you are not 100% satisfied,  please tell us.  If 100% better, tell your friends!

## 2011-08-08 ENCOUNTER — Emergency Department (HOSPITAL_COMMUNITY)
Admission: EM | Admit: 2011-08-08 | Discharge: 2011-08-08 | Disposition: A | Discharge status: Home / Self Care | Payer: Medicaid Other | Attending: Emergency Medicine | Admitting: Emergency Medicine

## 2011-08-08 ENCOUNTER — Encounter (HOSPITAL_COMMUNITY): Payer: Self-pay

## 2011-08-08 ENCOUNTER — Emergency Department (HOSPITAL_COMMUNITY): Payer: Medicaid Other

## 2011-08-08 DIAGNOSIS — Z87891 Personal history of nicotine dependence: Secondary | ICD-10-CM | POA: Insufficient documentation

## 2011-08-08 DIAGNOSIS — E785 Hyperlipidemia, unspecified: Secondary | ICD-10-CM | POA: Insufficient documentation

## 2011-08-08 DIAGNOSIS — J449 Chronic obstructive pulmonary disease, unspecified: Secondary | ICD-10-CM | POA: Insufficient documentation

## 2011-08-08 DIAGNOSIS — I10 Essential (primary) hypertension: Secondary | ICD-10-CM | POA: Insufficient documentation

## 2011-08-08 DIAGNOSIS — Z8249 Family history of ischemic heart disease and other diseases of the circulatory system: Secondary | ICD-10-CM | POA: Insufficient documentation

## 2011-08-08 DIAGNOSIS — Z825 Family history of asthma and other chronic lower respiratory diseases: Secondary | ICD-10-CM | POA: Insufficient documentation

## 2011-08-08 DIAGNOSIS — M19039 Primary osteoarthritis, unspecified wrist: Secondary | ICD-10-CM | POA: Insufficient documentation

## 2011-08-08 DIAGNOSIS — E119 Type 2 diabetes mellitus without complications: Secondary | ICD-10-CM | POA: Insufficient documentation

## 2011-08-08 DIAGNOSIS — Z803 Family history of malignant neoplasm of breast: Secondary | ICD-10-CM | POA: Insufficient documentation

## 2011-08-08 DIAGNOSIS — J4489 Other specified chronic obstructive pulmonary disease: Secondary | ICD-10-CM | POA: Insufficient documentation

## 2011-08-08 DIAGNOSIS — M1990 Unspecified osteoarthritis, unspecified site: Secondary | ICD-10-CM

## 2011-08-08 DIAGNOSIS — Z9089 Acquired absence of other organs: Secondary | ICD-10-CM | POA: Insufficient documentation

## 2011-08-08 DIAGNOSIS — K219 Gastro-esophageal reflux disease without esophagitis: Secondary | ICD-10-CM | POA: Insufficient documentation

## 2011-08-08 MED ORDER — HYDROCODONE-ACETAMINOPHEN 5-325 MG PO TABS: 1.0000 | ORAL_TABLET | ORAL | Status: AC | PRN

## 2011-08-08 MED ORDER — HYDROCODONE-ACETAMINOPHEN 5-325 MG PO TABS
1.0000 | ORAL_TABLET | Freq: Once | ORAL | Status: AC
  Administered 2011-08-08: 1 via ORAL
  Filled 2011-08-08: qty 1

## 2011-08-08 NOTE — ED Provider Notes (Signed)
History     CSN: [id number]  Arrival date & time 08/08/11  1714   First MD Initiated Contact with Patient 08/08/11 1734      Chief Complaint  Patient presents with  . Arm Pain    (Consider location/radiation/quality/duration/timing/severity/associated sxs/prior treatment) HPI Comments: Patient with a history of arthritis presents with left hand and wrist pain that started today - she denies injury to the area, states that the pain is worse with movement.  She notes a small area on the left 2nd finger where she thinks she may have been bitten by an insect.  She denies fever, chills, nausea, vomiting, - no history of gout or infection.  Patient is a 51 y.o. female presenting with arm pain. The history is provided by the patient. No language interpreter was used.  Arm Pain This is a new problem. The current episode started today. The problem occurs constantly. The problem has been unchanged. Associated symptoms include arthralgias and joint swelling. Pertinent negatives include no abdominal pain, anorexia, change in bowel habit, chest pain, chills, congestion, coughing, diaphoresis, fatigue, fever, headaches, myalgias, nausea, neck pain, numbness, rash, sore throat, swollen glands, urinary symptoms, vertigo, visual change, vomiting or weakness. The symptoms are aggravated by bending. She has tried nothing for the symptoms. The treatment provided no relief.    Past Medical History  Diagnosis Date  . Hypertension     Pt on lisinopril  . GERD (gastroesophageal reflux disease)     Pt on Protonix daily  . Arthritis     Takes Naprosyn   . Diabetes mellitus     Takes Metformin 750 mg  . Hyperlipidemia   . Asthma   . COPD (chronic obstructive pulmonary disease)   . Shortness of breath     occasional - uses breathing tx at home  . Headache     otc meds prn  . Irritable bowel syndrome 11/19/2010    Past Surgical History  Procedure Date  . Hernia repair   . Cholecystectomy   . Svd      x 2  . Tubal ligation   . Endometrial ablation 10/2010  . Upper gastrointestinal endoscopy 04/28/11    Family History  Problem Relation Age of Onset  . Asthma Son     had as a child  . Heart disease Sister   . Breast cancer Sister     History  Substance Use Topics  . Smoking status: Former Smoker -- 1.0 packs/day for 10 years    Types: Cigarettes    Quit date: 09/28/2000  . Smokeless tobacco: Never Used  . Alcohol Use: No    OB History    Grav Para Term Preterm Abortions TAB SAB Ect Mult Living                  Review of Systems  Constitutional: Negative for fever, chills, diaphoresis and fatigue.  HENT: Negative for congestion, sore throat and neck pain.   Respiratory: Negative for cough.   Cardiovascular: Negative for chest pain.  Gastrointestinal: Negative for nausea, vomiting, abdominal pain, anorexia and change in bowel habit.  Musculoskeletal: Positive for joint swelling and arthralgias. Negative for myalgias.  Skin: Negative for rash.  Neurological: Negative for vertigo, weakness, numbness and headaches.  All other systems reviewed and are negative.    Allergies  Other; Aspirin; and Ace inhibitors  Home Medications   Current Outpatient Rx  Name Route Sig Dispense Refill  . ALBUTEROL SULFATE (2.5 MG/3ML) 0.083% IN NEBU Nebulization Take  3 mLs (2.5 mg total) by nebulization every 4 (four) hours as needed for wheezing. 75 mL 1  . COLCHICINE 0.6 MG PO TABS Oral Take 0.6 mg by mouth daily.    . CYCLOBENZAPRINE HCL 5 MG PO TABS Oral Take 5 mg by mouth 3 (three) times daily.     Marland Kitchen ESTROGENS CONJUGATED 0.625 MG PO TABS Oral Take 0.625 mg by mouth daily. Take daily for 21 days then do not take for 7 days.     Marland Kitchen FAMOTIDINE 20 MG PO TABS  One at bedtime 30 tablet 11  . FLUTICASONE FUROATE 27.5 MCG/SPRAY NA SUSP Nasal Place 2 sprays into the nose 2 (two) times daily.     Marland Kitchen GABAPENTIN 300 MG PO CAPS Oral Take 300 mg by mouth 3 (three) times daily.    Marland Kitchen HYDROXYZINE HCL  10 MG PO TABS Oral Take 10 mg by mouth at bedtime.    Marland Kitchen METFORMIN HCL ER (MOD) 500 MG PO TB24 Oral Take 500 mg by mouth daily with breakfast.    . MOMETASONE FURO-FORMOTEROL FUM 100-5 MCG/ACT IN AERO Inhalation Inhale 2 puffs into the lungs 2 (two) times daily. 1 Inhaler 11  . PANTOPRAZOLE SODIUM 40 MG PO TBEC Oral Take 1 tablet (40 mg total) by mouth daily. 30 tablet 0  . SUCRALFATE 1 G PO TABS Oral Take 1 g by mouth 3 (three) times daily.    Marland Kitchen PROMETHAZINE HCL 25 MG RE SUPP Rectal Place 1 suppository (25 mg total) rectally every 6 (six) hours as needed for nausea. 12 each 0  . PROMETHAZINE HCL 25 MG PO TABS Oral Take 1 tablet (25 mg total) by mouth every 6 (six) hours as needed for nausea. 12 tablet 0    BP 126/74  Pulse 85  Temp 98.5 F (36.9 C) (Oral)  Resp 16  SpO2 98%  Physical Exam  Nursing note and vitals reviewed. Constitutional: She is oriented to person, place, and time. She appears well-developed and well-nourished.       grimacing  HENT:  Head: Normocephalic and atraumatic.  Right Ear: External ear normal.  Left Ear: External ear normal.  Nose: Nose normal.  Mouth/Throat: Oropharynx is clear and moist. No oropharyngeal exudate.  Eyes: Conjunctivae are normal. Pupils are equal, round, and reactive to light. No scleral icterus.  Neck: Normal range of motion. Neck supple.  Cardiovascular: Normal rate, regular rhythm and normal heart sounds.  Exam reveals no gallop and no friction rub.   No murmur heard. Pulmonary/Chest: Effort normal and breath sounds normal. No respiratory distress. She has no wheezes. She has no rales. She exhibits no tenderness.  Abdominal: Soft. Bowel sounds are normal. She exhibits no distension. There is no tenderness.  Musculoskeletal: She exhibits tenderness. She exhibits no edema.       Left wrist: She exhibits tenderness and bony tenderness. She exhibits normal range of motion, no swelling and no deformity.       Left hand: She exhibits  tenderness and bony tenderness. She exhibits normal range of motion, normal capillary refill, no deformity, no laceration and no swelling. normal sensation noted. Normal strength noted.  Lymphadenopathy:    She has no cervical adenopathy.  Neurological: She is alert and oriented to person, place, and time. No cranial nerve deficit. She exhibits normal muscle tone. Coordination normal.  Skin: Skin is warm and dry. No rash noted. No erythema. No pallor.  Psychiatric: She has a normal mood and affect. Her behavior is normal. Judgment and  thought content normal.    ED Course  Procedures (including critical care time)  Labs Reviewed - No data to display Dg Hand Complete Left  08/08/2011  *RADIOLOGY REPORT*  Clinical Data: Pain left hand, no previous injury  LEFT HAND - COMPLETE 3+ VIEW  Comparison: Wrist film 05/24/2004  Findings: No acute findings in the carpal bones.  There is a cyst in the lunate unchanged from prior.  No evidence of metacarpal or phalangeal fracture or dislocation.  IMPRESSION: No acute osseous abnormality.  Original Report Authenticated By: Genevive Bi, M.D.     Arthritis   MDM  Patient with tenderness to palpation of all joints in the hand and wrist - no soft tissue swelling, no erythema or concern for septic joint.       Izola Price Moriarty, [place] 08/08/11 1836

## 2011-08-08 NOTE — ED Notes (Signed)
Pt in from home states left arm pain states noted possible insect bite on the second left digit radiating up arm states onset 30 minutes ago

## 2011-08-09 NOTE — ED Provider Notes (Signed)
Medical screening examination/treatment/procedure(s) were performed by non-physician practitioner and as supervising physician I was immediately available for consultation/collaboration.  Nakyla Bracco M Covey Baller, MD 08/09/11 1237 

## 2011-09-02 ENCOUNTER — Telehealth: Payer: Self-pay | Admitting: Internal Medicine

## 2011-09-02 NOTE — Telephone Encounter (Signed)
lmomtcb x1--benicar is not on pt med list

## 2011-09-03 MED ORDER — MOMETASONE FURO-FORMOTEROL FUM 100-5 MCG/ACT IN AERO: 2.0000 | INHALATION_SPRAY | Freq: Two times a day (BID) | RESPIRATORY_TRACT | Status: DC

## 2011-09-03 MED ORDER — OLMESARTAN MEDOXOMIL-HCTZ 20-12.5 MG PO TABS: 1.0000 | ORAL_TABLET | Freq: Every day | ORAL | Status: DC

## 2011-09-03 NOTE — Telephone Encounter (Signed)
Per OV note on 04/16/11 : Take benicar 20/12.5(or one half of the 40 mg strength) one daily in place of lisinopril  pt states she has samples but ran out and has not taken it in several weeks. She is also requesting a refill on dulera. Refills sent and pt advised to call when she runs out for more refills and not to go without her meds. Pt states understanding. Carron Curie, CMA

## 2011-09-04 ENCOUNTER — Telehealth: Payer: Self-pay | Admitting: Internal Medicine

## 2011-09-04 MED ORDER — VALSARTAN-HYDROCHLOROTHIAZIDE 160-12.5 MG PO TABS: 1.0000 | ORAL_TABLET | Freq: Every day | ORAL | Status: DC

## 2011-09-04 NOTE — Telephone Encounter (Signed)
Spoke with pt and notified of recs per MW. She verbalized understanding and rx was sent to pharm.  

## 2011-09-04 NOTE — Telephone Encounter (Signed)
Spoke with pt. She is asking for alternative to benicar, med not covered by by medicaid and they did not give her any alternatives. Please advise, thanks!

## 2011-09-04 NOTE — Telephone Encounter (Signed)
diovan 160/12.5 one daily

## 2011-09-07 ENCOUNTER — Encounter (HOSPITAL_COMMUNITY): Payer: Self-pay | Admitting: *Deleted

## 2011-09-07 ENCOUNTER — Emergency Department (HOSPITAL_COMMUNITY)
Admission: EM | Admit: 2011-09-07 | Discharge: 2011-09-07 | Disposition: A | Discharge status: Home / Self Care | Payer: Medicaid Other | Source: Home / Self Care

## 2011-09-07 DIAGNOSIS — Z23 Encounter for immunization: Secondary | ICD-10-CM

## 2011-09-07 DIAGNOSIS — L0390 Cellulitis, unspecified: Secondary | ICD-10-CM

## 2011-09-07 DIAGNOSIS — L0291 Cutaneous abscess, unspecified: Secondary | ICD-10-CM

## 2011-09-07 MED ORDER — DOXYCYCLINE HYCLATE 50 MG PO CAPS: 100.0000 mg | ORAL_CAPSULE | Freq: Two times a day (BID) | ORAL | Status: DC

## 2011-09-07 MED ORDER — TETANUS-DIPHTH-ACELL PERTUSSIS 5-2.5-18.5 LF-MCG/0.5 IM SUSP
INTRAMUSCULAR | Status: AC
  Filled 2011-09-07: qty 0.5

## 2011-09-07 MED ORDER — TETANUS-DIPHTHERIA TOXOIDS TD 5-2 LFU IM INJ: 0.5000 mL | INJECTION | Freq: Once | INTRAMUSCULAR | Status: DC

## 2011-09-07 MED ORDER — IBUPROFEN 200 MG PO TABS: 800.0000 mg | ORAL_TABLET | Freq: Four times a day (QID) | ORAL | Status: AC | PRN

## 2011-09-07 MED ORDER — DOXYCYCLINE HYCLATE 50 MG PO CAPS: 50.0000 mg | ORAL_CAPSULE | Freq: Two times a day (BID) | ORAL | Status: DC

## 2011-09-07 MED ORDER — DOXYCYCLINE HYCLATE 100 MG PO TABS: 100.0000 mg | ORAL_TABLET | Freq: Two times a day (BID) | ORAL | Status: AC

## 2011-09-07 MED ORDER — TETANUS-DIPHTH-ACELL PERTUSSIS 5-2.5-18.5 LF-MCG/0.5 IM SUSP
0.5000 mL | Freq: Once | INTRAMUSCULAR | Status: AC
  Administered 2011-09-07: 0.5 mL via INTRAMUSCULAR

## 2011-09-07 NOTE — ED Notes (Signed)
Pt   Reports  Pain  And  Swelling  r  Elbow area  The  Skin is  Slightly  reddned   And   Is  Warm to the  Touch       -  She  denys  Any  specefic  Injury         She  Reports  The  Symptoms  Started  Yesterday   -  She  Also  Reports  Pain in the  l  Knee      X  1  Week  Again  She  denys  Any  Known injury

## 2011-09-07 NOTE — ED Provider Notes (Cosign Needed)
History     CSN: [id number]  Arrival date & time 09/07/11  1201   First MD Initiated Contact with Patient 09/07/11 1303      Chief Complaint  Patient presents with  . Elbow Pain   51 yr old female noted a bite on her R elbow about 11:00 am.  She was inside the house at the time and didnt see any bug bite her. IT seemed to have sowllen up and got "real sore".  She states she has never had this before.  No pets at home, lives in [place]. Went to [employer] [facility] last pm and the swelling got a little bigger and called PCP and as she couldn't get in to Dr. Leanor Kail, came here Is a diabetic, her sugar this am was 116 this am She didn;t have any real fevers, but has been a little chilly Area feels warm and swollen to her Has not trialed anything for pain. Also states she has some knee pain which is chronic and she has seen her primary care physician for this in the past   HPI  Past Medical History  Diagnosis Date  . Hypertension     Pt on lisinopril  . GERD (gastroesophageal reflux disease)     Pt on Protonix daily  . Arthritis     Takes Naprosyn   . Diabetes mellitus     Takes Metformin 750 mg  . Hyperlipidemia   . Asthma   . COPD (chronic obstructive pulmonary disease)   . Shortness of breath     occasional - uses breathing tx at home  . Headache     otc meds prn  . Irritable bowel syndrome 11/19/2010    Past Surgical History  Procedure Date  . Hernia repair   . Cholecystectomy   . Svd      x 2  . Tubal ligation   . Endometrial ablation 10/2010  . Upper gastrointestinal endoscopy 04/28/11    Family History  Problem Relation Age of Onset  . Asthma Son     had as a child  . Heart disease Sister   . Breast cancer Sister     History  Substance Use Topics  . Smoking status: Former Smoker -- 1.0 packs/day for 10 years    Types: Cigarettes    Quit date: 09/28/2000  . Smokeless tobacco: Never Used  . Alcohol Use: No    OB History    Grav Para Term Preterm Abortions TAB  SAB Ect Mult Living                  Review of Systems No chest shortness of breath no nausea no vomiting no blurred or double vision No weakness however does have some tenderness to the right elbow. No abdominal pain no diarrhea no constipation no other issue No dysuria, does complain of knee pain which is worse in the morning and states she cannot ambulate on it until an hour passes. Has never had injections into that area  Allergies  Other; Aspirin; and Ace inhibitors  Home Medications   Current Outpatient Rx  Name Route Sig Dispense Refill  . ALBUTEROL SULFATE (2.5 MG/3ML) 0.083% IN NEBU Nebulization Take 3 mLs (2.5 mg total) by nebulization every 4 (four) hours as needed for wheezing. 75 mL 1  . COLCHICINE 0.6 MG PO TABS Oral Take 0.6 mg by mouth daily.    . CYCLOBENZAPRINE HCL 5 MG PO TABS Oral Take 5 mg by mouth 3 (three) times daily.     Marland Kitchen  ESTROGENS CONJUGATED 0.625 MG PO TABS Oral Take 0.625 mg by mouth daily. Take daily for 21 days then do not take for 7 days.     Marland Kitchen FAMOTIDINE 20 MG PO TABS  One at bedtime 30 tablet 11  . FLUTICASONE FUROATE 27.5 MCG/SPRAY NA SUSP Nasal Place 2 sprays into the nose 2 (two) times daily.     Marland Kitchen GABAPENTIN 300 MG PO CAPS Oral Take 300 mg by mouth 3 (three) times daily.    Marland Kitchen HYDROXYZINE HCL 10 MG PO TABS Oral Take 10 mg by mouth at bedtime.    Marland Kitchen METFORMIN HCL ER (MOD) 500 MG PO TB24 Oral Take 500 mg by mouth daily with breakfast.    . MOMETASONE FURO-FORMOTEROL FUM 100-5 MCG/ACT IN AERO Inhalation Inhale 2 puffs into the lungs 2 (two) times daily. 1 Inhaler 11  . OLMESARTAN MEDOXOMIL-HCTZ 20-12.5 MG PO TABS Oral Take 1 tablet by mouth daily. 30 tablet 6  . PANTOPRAZOLE SODIUM 40 MG PO TBEC Oral Take 1 tablet (40 mg total) by mouth daily. 30 tablet 0  . PROMETHAZINE HCL 25 MG RE SUPP Rectal Place 1 suppository (25 mg total) rectally every 6 (six) hours as needed for nausea. 12 each 0  . PROMETHAZINE HCL 25 MG PO TABS Oral Take 1 tablet (25 mg  total) by mouth every 6 (six) hours as needed for nausea. 12 tablet 0  . SUCRALFATE 1 G PO TABS Oral Take 1 g by mouth 3 (three) times daily.    Marland Kitchen VALSARTAN-HYDROCHLOROTHIAZIDE 160-12.5 MG PO TABS Oral Take 1 tablet by mouth daily. 30 tablet 5    BP 149/76  Pulse 76  Temp 98.1 F (36.7 C) (Oral)  Resp 18  SpO2 100%  Physical Exam Pleasant African American female, in no apparent distress Poor dentition no pallor or icterus No bruit No JVD S1-S2 no murmur rub or gallop Chest clinically clear Right elbow appears to have diffuse swelling to the in aspect of forearm with a punctate lesion appearing like a bite There is some warmth to the area as well  ED Course  Procedures (including critical care time)  Labs Reviewed - No data to display No results found.   No diagnosis found.    MDM  Area cellulitc, but no abcess or other issue. Will cover with doxicycline 100 bid, update her Tetanus injection And have her revisist her PCP ion 5 days for a wound check She understands the warning signs of infection I have explaine dto her and agrees to follow up sooner if no better  Pleas Koch, MD [facility] ([phone]         Rhetta Mura, MD 09/07/11 2010

## 2011-09-15 ENCOUNTER — Other Ambulatory Visit: Payer: Self-pay | Admitting: Internal Medicine

## 2011-09-15 ENCOUNTER — Telehealth: Payer: Self-pay | Admitting: Internal Medicine

## 2011-09-15 MED ORDER — BUDESONIDE-FORMOTEROL FUMARATE 80-4.5 MCG/ACT IN AERO: 2.0000 | INHALATION_SPRAY | Freq: Two times a day (BID) | RESPIRATORY_TRACT | Status: DC

## 2011-09-15 MED ORDER — DIOVAN HCT 160-12.5 MG PO TABS: 1.0000 | ORAL_TABLET | Freq: Every day | ORAL | Status: DC

## 2011-09-15 NOTE — Telephone Encounter (Signed)
Received a fax from [facility] [address] [phone],  stating that Elwin Sleight is not covered by insurance. Covered alternatives are Symbicort, Advair HFA, or Advair Diskus. Please advise if any of these alternatives will work for the patient. Rx has to written for name brand for insurance to cover. Carron Curie, CMA

## 2011-09-15 NOTE — Telephone Encounter (Signed)
symbicort 80 2 bid is ok

## 2011-09-15 NOTE — Telephone Encounter (Signed)
I spoke with pt and is aware her insurance will not cover the dulera. I advised her new rx for symbicort 80 2 po BID will be sent to the pharmacy. Pt is aware on how to use this inhaler. Nothing further was needed and rx was sent to the pharmacy

## 2011-09-15 NOTE — Telephone Encounter (Signed)
[name] TCB x1.  Pt was changed from benicar 20-12.5mg  to diovan 160-12.5 d/t insurance coverage.

## 2011-09-15 NOTE — Telephone Encounter (Signed)
Pt returned call.  She stated that she was told her insurance does not cover diovan either.  Has been out of her BP meds x2 weeks.  Advised pt will call her insurance company.  Per the [employer] website:  ANGIOTENSIN II RECEPTOR BLOCKER DIURETIC COMBINATIONS Requires trial and failure of ACE Inhibitor unless contraindicated or adverse event, even when using a preferred product Preferred Diovan HCT  losartan/HCTZ (generic for Hyzaar)  Non-Preferred Atacand HCT  Avalide  Benicar HCT  Edarbyclor  Hyzaar  irbesartan/HCTZ (generic for Avalide)  Micardis HCT  Teveten HCT ----- Per the 3.13.13 ov note w/ MW, pt was taken off of lisinopril 40mg  d/t cough/hoarseness.  Called [phone] (PA phone number provided on PA faxed from local pharmacy [fax was in triage]).  After holding for THIRTY FIVE minutes, spoke with Tresa Endo who stated medicaid denied coverage for this med is because DAW was not marked > medicaid will not cover the generic, only the brand name.    Called [facility], spoke with Gala Romney then pharmacist and informed him of the above.  Doug tried running the rx again but stated that a new rx needs to be sent and marked DAW.  This has been done.    [name] TCB x1 to inform pt.

## 2011-09-16 NOTE — Telephone Encounter (Signed)
Spoke with patient-states she picked up her Diovan yesterday at pharmacy and was thankful for all Shanda Bumps did to help her.

## 2011-11-02 ENCOUNTER — Encounter (HOSPITAL_COMMUNITY): Payer: Self-pay | Admitting: Emergency Medicine

## 2011-11-02 ENCOUNTER — Emergency Department (HOSPITAL_COMMUNITY)
Admission: EM | Admit: 2011-11-02 | Discharge: 2011-11-02 | Disposition: A | Discharge status: Home / Self Care | Payer: Medicaid Other | Source: Home / Self Care | Attending: Family Medicine | Admitting: Family Medicine

## 2011-11-02 DIAGNOSIS — R21 Rash and other nonspecific skin eruption: Secondary | ICD-10-CM

## 2011-11-02 MED ORDER — CEPHALEXIN 500 MG PO CAPS: 500.0000 mg | ORAL_CAPSULE | Freq: Four times a day (QID) | ORAL | Status: DC

## 2011-11-02 MED ORDER — HYDROXYZINE HCL 10 MG PO TABS: 10.0000 mg | ORAL_TABLET | Freq: Every day | ORAL | Status: DC

## 2011-11-02 MED ORDER — METHYLPREDNISOLONE 4 MG PO KIT: PACK | ORAL | Status: DC

## 2011-11-02 NOTE — ED Provider Notes (Signed)
Medical screening examination/treatment/procedure(s) were performed by resident physician or non-physician practitioner and as supervising physician I was immediately available for consultation/collaboration.   Teniya Filter DOUGLAS MD.    Kamari Bilek D Giovanni Biby, MD 11/02/11 2118 

## 2011-11-02 NOTE — ED Notes (Signed)
Noticed itchy bumps, onset 1 day ago, patient has scratched some bumps to now being open wounds.  Patient has places on all extremities.  No areas noted to trunk.  Patient has had runny nose, cough for 2 days.  Denies known fever.  Has spells of hot and cold

## 2011-11-02 NOTE — ED Provider Notes (Signed)
History     CSN: [id number]  Arrival date & time 11/02/11  1346   None     Chief Complaint  Patient presents with  . Insect Bite    (Consider location/radiation/quality/duration/timing/severity/associated sxs/prior treatment) The history is provided by the patient.  This patient complains of a pruritic rash.  Location: bilateral arms, right lower extremity  Onset: 1 day ago   Course: worsening Self-treated with: nothing            Improvement with treatment: n/a  History Itching: yes  Tenderness: no  New medications/antibiotics: no  Pet exposure: no  Recent travel or tropical exposure: no  New soaps, shampoos, detergent, clothing: no Tick/insect exposure: yes, states she was sitting on porch and felt bite on right upper arm   Red Flags Feeling ill: no Fever:no Facial/tongue swelling/difficulty breathing:  no  Diabetic or immunocompromised: pt is diabetic  Past Medical History  Diagnosis Date  . Hypertension     Pt on lisinopril  . GERD (gastroesophageal reflux disease)     Pt on Protonix daily  . Arthritis     Takes Naprosyn   . Diabetes mellitus     Takes Metformin 750 mg  . Hyperlipidemia   . Asthma   . COPD (chronic obstructive pulmonary disease)   . Shortness of breath     occasional - uses breathing tx at home  . Headache     otc meds prn  . Irritable bowel syndrome 11/19/2010    Past Surgical History  Procedure Date  . Hernia repair   . Cholecystectomy   . Svd      x 2  . Tubal ligation   . Endometrial ablation 10/2010  . Upper gastrointestinal endoscopy 04/28/11    Family History  Problem Relation Age of Onset  . Asthma Son     had as a child  . Heart disease Sister   . Breast cancer Sister     History  Substance Use Topics  . Smoking status: Former Smoker -- 1.0 packs/day for 10 years    Types: Cigarettes    Quit date: 09/28/2000  . Smokeless tobacco: Never Used  . Alcohol Use: No    OB History    Grav Para Term Preterm  Abortions TAB SAB Ect Mult Living                  Review of Systems  Constitutional: Negative.   Respiratory: Negative.   Cardiovascular: Negative.   Musculoskeletal: Negative.   Skin: Positive for rash and wound. Negative for color change and pallor.    Allergies  Other; Aspirin; and Ace inhibitors  Home Medications   Current Outpatient Rx  Name Route Sig Dispense Refill  . EPINEPHRINE 0.3 MG/0.3ML IJ DEVI Intramuscular Inject 0.3 mg into the muscle once.    Marland Kitchen EPINEPHRINE 0.15 MG/0.3ML IJ DEVI Intramuscular Inject 0.15 mg into the muscle as needed.    . ALBUTEROL SULFATE (2.5 MG/3ML) 0.083% IN NEBU Nebulization Take 3 mLs (2.5 mg total) by nebulization every 4 (four) hours as needed for wheezing. 75 mL 1  . BUDESONIDE-FORMOTEROL FUMARATE 80-4.5 MCG/ACT IN AERO Inhalation Inhale 2 puffs into the lungs 2 (two) times daily. 1 Inhaler 6  . CEPHALEXIN 500 MG PO CAPS Oral Take 1 capsule (500 mg total) by mouth 4 (four) times daily. 20 capsule 0  . COLCHICINE 0.6 MG PO TABS Oral Take 0.6 mg by mouth daily.    . CYCLOBENZAPRINE HCL 5 MG PO TABS  Oral Take 5 mg by mouth 3 (three) times daily.     Marland Kitchen DIOVAN HCT 160-12.5 MG PO TABS Oral Take 1 tablet by mouth daily. 30 tablet 5    Dispense as written.    Please dispense brand name only for insurance cove ...  . ESTROGENS CONJUGATED 0.625 MG PO TABS Oral Take 0.625 mg by mouth daily. Take daily for 21 days then do not take for 7 days.     Marland Kitchen FAMOTIDINE 20 MG PO TABS  One at bedtime 30 tablet 11  . FLUTICASONE FUROATE 27.5 MCG/SPRAY NA SUSP Nasal Place 2 sprays into the nose 2 (two) times daily.     Marland Kitchen GABAPENTIN 300 MG PO CAPS Oral Take 300 mg by mouth 3 (three) times daily.    Marland Kitchen HYDROXYZINE HCL 10 MG PO TABS Oral Take 1 tablet (10 mg total) by mouth at bedtime. 30 tablet 1  . METFORMIN HCL ER (MOD) 500 MG PO TB24 Oral Take 500 mg by mouth daily with breakfast.    . METHYLPREDNISOLONE 4 MG PO KIT  follow package directions 21 tablet 0  .  MOMETASONE FURO-FORMOTEROL FUM 100-5 MCG/ACT IN AERO Inhalation Inhale 2 puffs into the lungs 2 (two) times daily. 1 Inhaler 11  . OLMESARTAN MEDOXOMIL-HCTZ 20-12.5 MG PO TABS Oral Take 1 tablet by mouth daily. 30 tablet 6  . PANTOPRAZOLE SODIUM 40 MG PO TBEC Oral Take 1 tablet (40 mg total) by mouth daily. 30 tablet 0  . PROMETHAZINE HCL 25 MG RE SUPP Rectal Place 1 suppository (25 mg total) rectally every 6 (six) hours as needed for nausea. 12 each 0  . PROMETHAZINE HCL 25 MG PO TABS Oral Take 1 tablet (25 mg total) by mouth every 6 (six) hours as needed for nausea. 12 tablet 0  . SUCRALFATE 1 G PO TABS Oral Take 1 g by mouth 3 (three) times daily.      BP 153/83  Pulse 79  Temp 98.6 F (37 C) (Oral)  Resp 18  SpO2 99%  Physical Exam  Nursing note and vitals reviewed. Constitutional: She is oriented to person, place, and time. Vital signs are normal. She appears well-developed and well-nourished. She is active and cooperative.  HENT:  Head: Normocephalic.  Eyes: Conjunctivae normal are normal. Pupils are equal, round, and reactive to light. No scleral icterus.  Neck: Trachea normal. Neck supple.  Cardiovascular: Normal rate and regular rhythm.   Pulmonary/Chest: Effort normal and breath sounds normal.  Neurological: She is alert and oriented to person, place, and time. No cranial nerve deficit or sensory deficit.  Skin: Skin is warm and dry. Lesion and rash noted.     Psychiatric: She has a normal mood and affect. Her speech is normal and behavior is normal. Judgment and thought content normal. Cognition and memory are normal.    ED Course  Procedures (including critical care time)  Labs Reviewed - No data to display No results found.   1. Rash and nonspecific skin eruption       MDM  Cool showers; avoid heat, sunlight and anything that makes condition worse.  Atarax for itching.  Continue home medications as ordered.  Begin Medrol dosepak-follow instructions.  RTC if  symptoms do not improve or begin to have problems swallowing, breathing or significant change in condition.        Johnsie Kindred, NP 11/02/11 1546

## 2011-11-30 ENCOUNTER — Emergency Department (HOSPITAL_COMMUNITY): Admission: EM | Admit: 2011-11-30 | Discharge: 2011-11-30 | Payer: Medicaid Other | Source: Home / Self Care

## 2012-02-23 ENCOUNTER — Ambulatory Visit: Payer: Medicaid Other | Attending: Specialist | Admitting: Physical Therapy

## 2012-02-23 DIAGNOSIS — M25569 Pain in unspecified knee: Secondary | ICD-10-CM | POA: Insufficient documentation

## 2012-02-23 DIAGNOSIS — R262 Difficulty in walking, not elsewhere classified: Secondary | ICD-10-CM | POA: Insufficient documentation

## 2012-02-23 DIAGNOSIS — IMO0001 Reserved for inherently not codable concepts without codable children: Secondary | ICD-10-CM | POA: Insufficient documentation

## 2012-02-23 DIAGNOSIS — M25669 Stiffness of unspecified knee, not elsewhere classified: Secondary | ICD-10-CM | POA: Insufficient documentation

## 2012-03-01 ENCOUNTER — Ambulatory Visit: Payer: Medicaid Other | Admitting: Physical Therapy

## 2012-03-08 ENCOUNTER — Ambulatory Visit: Payer: Medicaid Other | Attending: Specialist | Admitting: Physical Therapy

## 2012-03-08 DIAGNOSIS — R262 Difficulty in walking, not elsewhere classified: Secondary | ICD-10-CM | POA: Insufficient documentation

## 2012-03-08 DIAGNOSIS — IMO0001 Reserved for inherently not codable concepts without codable children: Secondary | ICD-10-CM | POA: Insufficient documentation

## 2012-03-08 DIAGNOSIS — M25669 Stiffness of unspecified knee, not elsewhere classified: Secondary | ICD-10-CM | POA: Insufficient documentation

## 2012-03-08 DIAGNOSIS — M25569 Pain in unspecified knee: Secondary | ICD-10-CM | POA: Insufficient documentation

## 2012-03-15 ENCOUNTER — Ambulatory Visit: Payer: Medicaid Other | Admitting: Physical Therapy

## 2012-05-27 ENCOUNTER — Other Ambulatory Visit: Payer: Self-pay | Admitting: Internal Medicine

## 2012-07-28 ENCOUNTER — Emergency Department (HOSPITAL_COMMUNITY)
Admission: EM | Admit: 2012-07-28 | Discharge: 2012-07-28 | Disposition: A | Discharge status: Home / Self Care | Payer: Self-pay | Attending: Emergency Medicine | Admitting: Emergency Medicine

## 2012-07-28 DIAGNOSIS — E785 Hyperlipidemia, unspecified: Secondary | ICD-10-CM | POA: Insufficient documentation

## 2012-07-28 DIAGNOSIS — R21 Rash and other nonspecific skin eruption: Secondary | ICD-10-CM | POA: Insufficient documentation

## 2012-07-28 DIAGNOSIS — Y929 Unspecified place or not applicable: Secondary | ICD-10-CM | POA: Insufficient documentation

## 2012-07-28 DIAGNOSIS — Z79899 Other long term (current) drug therapy: Secondary | ICD-10-CM | POA: Insufficient documentation

## 2012-07-28 DIAGNOSIS — S90569A Insect bite (nonvenomous), unspecified ankle, initial encounter: Secondary | ICD-10-CM | POA: Insufficient documentation

## 2012-07-28 DIAGNOSIS — K219 Gastro-esophageal reflux disease without esophagitis: Secondary | ICD-10-CM | POA: Insufficient documentation

## 2012-07-28 DIAGNOSIS — J449 Chronic obstructive pulmonary disease, unspecified: Secondary | ICD-10-CM | POA: Insufficient documentation

## 2012-07-28 DIAGNOSIS — E119 Type 2 diabetes mellitus without complications: Secondary | ICD-10-CM | POA: Insufficient documentation

## 2012-07-28 DIAGNOSIS — W57XXXA Bitten or stung by nonvenomous insect and other nonvenomous arthropods, initial encounter: Secondary | ICD-10-CM | POA: Insufficient documentation

## 2012-07-28 DIAGNOSIS — J4489 Other specified chronic obstructive pulmonary disease: Secondary | ICD-10-CM | POA: Insufficient documentation

## 2012-07-28 DIAGNOSIS — Z8719 Personal history of other diseases of the digestive system: Secondary | ICD-10-CM | POA: Insufficient documentation

## 2012-07-28 DIAGNOSIS — M129 Arthropathy, unspecified: Secondary | ICD-10-CM | POA: Insufficient documentation

## 2012-07-28 DIAGNOSIS — T7840XA Allergy, unspecified, initial encounter: Secondary | ICD-10-CM

## 2012-07-28 DIAGNOSIS — Z87891 Personal history of nicotine dependence: Secondary | ICD-10-CM | POA: Insufficient documentation

## 2012-07-28 DIAGNOSIS — IMO0002 Reserved for concepts with insufficient information to code with codable children: Secondary | ICD-10-CM | POA: Insufficient documentation

## 2012-07-28 DIAGNOSIS — Y939 Activity, unspecified: Secondary | ICD-10-CM | POA: Insufficient documentation

## 2012-07-28 DIAGNOSIS — I10 Essential (primary) hypertension: Secondary | ICD-10-CM | POA: Insufficient documentation

## 2012-07-28 MED ORDER — DIPHENHYDRAMINE HCL 25 MG PO CAPS
ORAL_CAPSULE | ORAL | Status: AC
  Filled 2012-07-28: qty 1

## 2012-07-28 MED ORDER — DIPHENHYDRAMINE HCL 25 MG PO CAPS
50.0000 mg | ORAL_CAPSULE | Freq: Once | ORAL | Status: AC
  Administered 2012-07-28: 50 mg via ORAL
  Filled 2012-07-28: qty 1

## 2012-07-28 MED ORDER — DIPHENHYDRAMINE HCL 25 MG PO TABS: 25.0000 mg | ORAL_TABLET | Freq: Four times a day (QID) | ORAL | Status: DC | PRN

## 2012-07-28 MED ORDER — FAMOTIDINE 20 MG PO TABS
20.0000 mg | ORAL_TABLET | Freq: Once | ORAL | Status: AC
  Administered 2012-07-28: 20 mg via ORAL
  Filled 2012-07-28: qty 1

## 2012-07-28 MED ORDER — DEXAMETHASONE SODIUM PHOSPHATE 10 MG/ML IJ SOLN
10.0000 mg | Freq: Once | INTRAMUSCULAR | Status: AC
  Administered 2012-07-28: 10 mg via INTRAMUSCULAR
  Filled 2012-07-28: qty 1

## 2012-07-28 MED ORDER — FAMOTIDINE 40 MG PO TABS: 20.0000 mg | ORAL_TABLET | Freq: Every day | ORAL | Status: DC

## 2012-07-28 NOTE — ED Notes (Signed)
Pt c/o bug bites to R and L legs. Pt states she has a red, swollen area to back of R leg. Pt states area is red, warm to touch and feels hard. Pt also has a small red area to L lower leg. Pt with no acute distress. Pt ambulatory to exam room with steady gait. Pt with no acute distress.

## 2012-07-28 NOTE — ED Notes (Signed)
MD at bedside. Dr. Campos at bedside.  

## 2012-07-28 NOTE — ED Provider Notes (Signed)
Medical screening examination/treatment/procedure(s) were conducted as a shared visit with non-physician practitioner(s) and myself.  I personally evaluated the patient during the encounter  This appears to be localized allergic reaction to the posterior right leg.  Benadryl and symptomatic treatment.  No secondary signs of infection  Lyanne Co, MD 07/28/12 2349

## 2012-07-28 NOTE — ED Provider Notes (Signed)
This chart was scribed for Junious Silk (PA) non-physician practitioner working with Lyanne Co, MD by Sofie Rower, ED Scribe. This patient was seen in room WTR9/WTR9 and the patient's care was started at 7:50PM.    History    CSN: [id number] Arrival date & time 07/28/12  1936  First MD Initiated Contact with Patient 07/28/12 1950     Chief Complaint  Patient presents with  . Insect Bite   (Consider location/radiation/quality/duration/timing/severity/associated sxs/prior Treatment) The history is provided by the patient. No language interpreter was used.    Stacy [name] is a 52 y.o. female , with a hx of hypertension, GERD, arthritis, diabetes mellitus, hyperlipidemia, asthma, COPD, shortness of breath, headache, IBS, hernia repair, cholecystectomy, tubal ligation, endometrial ablation, and upper gastrointestinal endoscopy,  who presents to the Emergency Department complaining of sudden, progressively worsening, insect bite, located at the posterior, right upper leg, onset today (07/28/12).  Associated symptoms include sweats occuring intermittently throughout the day today. The pt reports she believes she was bit by an insect 3 hours ago. The pt characterizes the pain associated with the insect bite as a constant, sore sensation without modifying factors. No sensation that her throat is closing, difficulty breathing, tongue swelling.   The pt denies fever, difficulty breathing and shortness of breath.   The pt does not smoke or drink alcohol.   PCP is Dr. Julio Sicks.   Past Medical History  Diagnosis Date  . Hypertension     Pt on lisinopril  . GERD (gastroesophageal reflux disease)     Pt on Protonix daily  . Arthritis     Takes Naprosyn   . Diabetes mellitus     Takes Metformin 750 mg  . Hyperlipidemia   . Asthma   . COPD (chronic obstructive pulmonary disease)   . Shortness of breath     occasional - uses breathing tx at home  . Headache     otc meds prn  . Irritable  bowel syndrome 11/19/2010   Past Surgical History  Procedure Laterality Date  . Hernia repair    . Cholecystectomy    . Svd       x 2  . Tubal ligation    . Endometrial ablation  10/2010  . Upper gastrointestinal endoscopy  04/28/11   Family History  Problem Relation Age of Onset  . Asthma Son     had as a child  . Heart disease Sister   . Breast cancer Sister    History  Substance Use Topics  . Smoking status: Former Smoker -- 1.00 packs/day for 10 years    Types: Cigarettes    Quit date: 09/28/2000  . Smokeless tobacco: Never Used  . Alcohol Use: No   OB History   Grav Para Term Preterm Abortions TAB SAB Ect Mult Living                 Review of Systems  Constitutional: Negative for fever.  Respiratory: Negative for shortness of breath.   Skin: Positive for rash.  All other systems reviewed and are negative.    Allergies  Other; Aspirin; and Ace inhibitors  Home Medications   Current Outpatient Rx  Name  Route  Sig  Dispense  Refill  . albuterol (PROVENTIL) (2.5 MG/3ML) 0.083% nebulizer solution   Nebulization   Take 3 mLs (2.5 mg total) by nebulization every 4 (four) hours as needed for wheezing.   75 mL   1   . budesonide-formoterol (SYMBICORT) 80-4.5 MCG/ACT inhaler  Inhalation   Inhale 2 puffs into the lungs 2 (two) times daily.   1 Inhaler   6   . colchicine 0.6 MG tablet   Oral   Take 0.6 mg by mouth daily.         Marland Kitchen DIOVAN HCT 160-12.5 MG per tablet   Oral   Take 1 tablet by mouth daily.   30 tablet   5     Dispense as written.    Please dispense brand name only for insurance cove ...   . EPINEPHrine (EPI-PEN) 0.3 mg/0.3 mL DEVI   Intramuscular   Inject 0.3 mg into the muscle once.         . estrogens, conjugated, (PREMARIN) 0.625 MG tablet   Oral   Take 0.625 mg by mouth daily. Take daily for 21 days then do not take for 7 days.          . famotidine (PEPCID) 20 MG tablet      One at bedtime   30 tablet   11   .  fluticasone (VERAMYST) 27.5 MCG/SPRAY nasal spray   Nasal   Place 2 sprays into the nose 2 (two) times daily.          Marland Kitchen gabapentin (NEURONTIN) 300 MG capsule   Oral   Take 300 mg by mouth 3 (three) times daily.         . hydrOXYzine (ATARAX/VISTARIL) 10 MG tablet   Oral   Take 1 tablet (10 mg total) by mouth at bedtime.   30 tablet   1   . metFORMIN (GLUMETZA) 500 MG (MOD) 24 hr tablet   Oral   Take 500 mg by mouth daily with breakfast.         . mometasone-formoterol (DULERA) 100-5 MCG/ACT AERO   Inhalation   Inhale 2 puffs into the lungs 2 (two) times daily.   1 Inhaler   11   . olmesartan-hydrochlorothiazide (BENICAR HCT) 20-12.5 MG per tablet   Oral   Take 1 tablet by mouth daily.   30 tablet   6   . pantoprazole (PROTONIX) 40 MG tablet   Oral   Take 1 tablet (40 mg total) by mouth daily.   30 tablet   0   . EXPIRED: promethazine (PHENERGAN) 25 MG suppository   Rectal   Place 1 suppository (25 mg total) rectally every 6 (six) hours as needed for nausea.   12 each   0   . EXPIRED: promethazine (PHENERGAN) 25 MG tablet   Oral   Take 1 tablet (25 mg total) by mouth every 6 (six) hours as needed for nausea.   12 tablet   0   . sucralfate (CARAFATE) 1 G tablet   Oral   Take 1 g by mouth 3 (three) times daily.          BP 104/63  Pulse 69  Temp(Src) 99.1 F (37.3 C) (Oral)  Resp 16  SpO2 96% Physical Exam  Nursing note and vitals reviewed. Constitutional: She is oriented to person, place, and time. She appears well-developed and well-nourished. No distress.  HENT:  Head: Normocephalic and atraumatic.  Right Ear: External ear normal.  Left Ear: External ear normal.  Nose: Nose normal.  Mouth/Throat: Oropharynx is clear and moist.  Eyes: Conjunctivae are normal.  Neck: Normal range of motion.  Cardiovascular: Normal rate, regular rhythm and normal heart sounds.   Pulmonary/Chest: Effort normal and breath sounds normal. No stridor. No  respiratory distress. She has  no wheezes. She has no rales.  Abdominal: Soft. She exhibits no distension.  Musculoskeletal: Normal range of motion.  Neurological: She is alert and oriented to person, place, and time. She has normal strength.  Skin: Skin is warm and dry. Rash noted. She is not diaphoretic. There is erythema.  Right upper leg: 8 cm in diameter area of induration with surrouding erythema. No fluctuance. Left lower leg: 2 cm area of induration with surrounding erythema without fluctuance.   Psychiatric: She has a normal mood and affect. Her behavior is normal.    ED Course  Procedures (including critical care time)  DIAGNOSTIC STUDIES: Oxygen Saturation is 96% on room air, normal by my interpretation.    COORDINATION OF CARE:  8:15 PM- Treatment plan discussed with patient. Pt agrees with treatment.  8:20PM- Attending physician (Dr. Patria Mane) evaluates patient. Treatment plan discussed with patient. Pt agrees with treatment.      Labs Reviewed - No data to display No results found. 1. Allergic reaction, initial encounter     MDM  Patient re-evaluated prior to dc, is hemodynamically stable, in no respiratory distress, and denies the feeling of throat closing. Pt has been advised to take OTC benadryl & return to the ED if they have a mod-severe allergic rxn (s/s including throat closing, difficulty breathing, swelling of lips face or tongue). Pt is to follow up with their PCP. Pt is agreeable with plan & verbalizes understanding.   I personally performed the services described in this documentation, which was scribed in my presence. The recorded information has been reviewed and is accurate.     Mora Bellman, PA-C 07/28/12 2157

## 2012-08-19 ENCOUNTER — Encounter (HOSPITAL_COMMUNITY): Payer: Self-pay | Admitting: *Deleted

## 2012-08-19 ENCOUNTER — Emergency Department (HOSPITAL_COMMUNITY)
Admission: EM | Admit: 2012-08-19 | Discharge: 2012-08-19 | Disposition: A | Discharge status: Home / Self Care | Payer: Self-pay | Attending: Emergency Medicine | Admitting: Emergency Medicine

## 2012-08-19 DIAGNOSIS — R5381 Other malaise: Secondary | ICD-10-CM | POA: Insufficient documentation

## 2012-08-19 DIAGNOSIS — Z8639 Personal history of other endocrine, nutritional and metabolic disease: Secondary | ICD-10-CM | POA: Insufficient documentation

## 2012-08-19 DIAGNOSIS — Z79899 Other long term (current) drug therapy: Secondary | ICD-10-CM | POA: Insufficient documentation

## 2012-08-19 DIAGNOSIS — M62830 Muscle spasm of back: Secondary | ICD-10-CM

## 2012-08-19 DIAGNOSIS — M25562 Pain in left knee: Secondary | ICD-10-CM

## 2012-08-19 DIAGNOSIS — K219 Gastro-esophageal reflux disease without esophagitis: Secondary | ICD-10-CM | POA: Insufficient documentation

## 2012-08-19 DIAGNOSIS — Z862 Personal history of diseases of the blood and blood-forming organs and certain disorders involving the immune mechanism: Secondary | ICD-10-CM | POA: Insufficient documentation

## 2012-08-19 DIAGNOSIS — E119 Type 2 diabetes mellitus without complications: Secondary | ICD-10-CM | POA: Insufficient documentation

## 2012-08-19 DIAGNOSIS — Z8739 Personal history of other diseases of the musculoskeletal system and connective tissue: Secondary | ICD-10-CM | POA: Insufficient documentation

## 2012-08-19 DIAGNOSIS — M5380 Other specified dorsopathies, site unspecified: Secondary | ICD-10-CM | POA: Insufficient documentation

## 2012-08-19 DIAGNOSIS — I10 Essential (primary) hypertension: Secondary | ICD-10-CM | POA: Insufficient documentation

## 2012-08-19 DIAGNOSIS — M25569 Pain in unspecified knee: Secondary | ICD-10-CM | POA: Insufficient documentation

## 2012-08-19 DIAGNOSIS — M545 Low back pain, unspecified: Secondary | ICD-10-CM | POA: Insufficient documentation

## 2012-08-19 DIAGNOSIS — IMO0002 Reserved for concepts with insufficient information to code with codable children: Secondary | ICD-10-CM | POA: Insufficient documentation

## 2012-08-19 DIAGNOSIS — Z8719 Personal history of other diseases of the digestive system: Secondary | ICD-10-CM | POA: Insufficient documentation

## 2012-08-19 DIAGNOSIS — J441 Chronic obstructive pulmonary disease with (acute) exacerbation: Secondary | ICD-10-CM | POA: Insufficient documentation

## 2012-08-19 DIAGNOSIS — Z87891 Personal history of nicotine dependence: Secondary | ICD-10-CM | POA: Insufficient documentation

## 2012-08-19 MED ORDER — METHOCARBAMOL 500 MG PO TABS: 500.0000 mg | ORAL_TABLET | Freq: Two times a day (BID) | ORAL | Status: DC | PRN

## 2012-08-19 MED ORDER — OXYCODONE-ACETAMINOPHEN 5-325 MG PO TABS: 1.0000 | ORAL_TABLET | ORAL | Status: DC | PRN

## 2012-08-19 NOTE — ED Notes (Signed)
Pt states husband had surgery 2 weeks ago, since then she's had lower back pain from having to help move him, then also states her R knee has been "giving out" on her and she's tired of it.

## 2012-08-19 NOTE — ED Provider Notes (Signed)
History    This chart was scribed for non-physician practitioner Sharilyn Sites, PA working with Candyce Churn, MD by Quintella Reichert, ED Scribe. This patient was seen in room WTR6/WTR6 and the patient's care was started at 5:50 PM.   CSN: [id number]  Arrival date & time 08/19/12  1717    Chief Complaint  Patient presents with  . Knee Pain  . Back Pain    The history is provided by the patient. No language interpreter was used.   HPI Comments: Stacy [name] is a 52 y.o. female with h/o arthritis, DM, COPD, HTN and hyperlipidemia who presents to the Emergency Department complaining of gradual-onset, progressively-worsening, constant, moderate-to-severe left knee pain and weakness that began in the past 2 weeks since she has been helping to move her 245-lb husband who is recovering from a recent surgery.  Pt also complains of accompanying constant lower back pain described as tightness.  Knee pain is exacerbated by bending the knee and by bearing weight. No recent injury or trauma. Pt is ambulatory but with pain.  She states that her knee has given out while walking 5 times today.  Pt has h/o pain in the knee intermittently and reports that she received an arthroscopy in January 2014 (Dr. Thomasena Edis).  She states she did not receive a diagnosis.  No loss of bowel or bladder function.  No numbness or paresthesias of LE.  Taken OTC pain meds without relief of sx.    Past Medical History  Diagnosis Date  . Hypertension     Pt on lisinopril  . GERD (gastroesophageal reflux disease)     Pt on Protonix daily  . Arthritis     Takes Naprosyn   . Diabetes mellitus     Takes Metformin 750 mg  . Hyperlipidemia   . Asthma   . COPD (chronic obstructive pulmonary disease)   . Shortness of breath     occasional - uses breathing tx at home  . Headache(784.0)     otc meds prn  . Irritable bowel syndrome 11/19/2010    Past Surgical History  Procedure Laterality Date  . Hernia repair    .  Cholecystectomy    . Svd       x 2  . Tubal ligation    . Endometrial ablation  10/2010  . Upper gastrointestinal endoscopy  04/28/11    Family History  Problem Relation Age of Onset  . Asthma Son     had as a child  . Heart disease Sister   . Breast cancer Sister     History  Substance Use Topics  . Smoking status: Former Smoker -- 1.00 packs/day for 10 years    Types: Cigarettes    Quit date: 09/28/2000  . Smokeless tobacco: Never Used  . Alcohol Use: No    OB History   Grav Para Term Preterm Abortions TAB SAB Ect Mult Living                   Review of Systems  Musculoskeletal: Positive for back pain and arthralgias.  All other systems reviewed and are negative.     Allergies  Other; Aspirin; and Ace inhibitors  Home Medications   Current Outpatient Rx  Name  Route  Sig  Dispense  Refill  . albuterol (PROVENTIL) (2.5 MG/3ML) 0.083% nebulizer solution   Nebulization   Take 3 mLs (2.5 mg total) by nebulization every 4 (four) hours as needed for wheezing.   75 mL  1   . budesonide-formoterol (SYMBICORT) 80-4.5 MCG/ACT inhaler   Inhalation   Inhale 2 puffs into the lungs 2 (two) times daily.   1 Inhaler   6   . colchicine 0.6 MG tablet   Oral   Take 0.6 mg by mouth daily.         Marland Kitchen DIOVAN HCT 160-12.5 MG per tablet   Oral   Take 1 tablet by mouth daily.   30 tablet   5     Dispense as written.    Please dispense brand name only for insurance cove ...   . diphenhydrAMINE (BENADRYL) 25 MG tablet   Oral   Take 1 tablet (25 mg total) by mouth every 6 (six) hours as needed for itching.   30 tablet   0   . EPINEPHrine (EPI-PEN) 0.3 mg/0.3 mL DEVI   Intramuscular   Inject 0.3 mg into the muscle once.         . estrogens, conjugated, (PREMARIN) 0.625 MG tablet   Oral   Take 0.625 mg by mouth daily. Take daily for 21 days then do not take for 7 days.          Marland Kitchen EXPIRED: famotidine (PEPCID) 20 MG tablet      One at bedtime   30  tablet   11   . famotidine (PEPCID) 40 MG tablet   Oral   Take 0.5 tablets (20 mg total) by mouth daily. OTC   10 tablet   0   . fluticasone (VERAMYST) 27.5 MCG/SPRAY nasal spray   Nasal   Place 2 sprays into the nose 2 (two) times daily.          Marland Kitchen gabapentin (NEURONTIN) 300 MG capsule   Oral   Take 300 mg by mouth 3 (three) times daily.         . hydrOXYzine (ATARAX/VISTARIL) 10 MG tablet   Oral   Take 1 tablet (10 mg total) by mouth at bedtime.   30 tablet   1   . metFORMIN (GLUMETZA) 500 MG (MOD) 24 hr tablet   Oral   Take 500 mg by mouth daily with breakfast.         . mometasone-formoterol (DULERA) 100-5 MCG/ACT AERO   Inhalation   Inhale 2 puffs into the lungs 2 (two) times daily.   1 Inhaler   11   . pantoprazole (PROTONIX) 40 MG tablet   Oral   Take 1 tablet (40 mg total) by mouth daily.   30 tablet   0   . sucralfate (CARAFATE) 1 G tablet   Oral   Take 1 g by mouth 3 (three) times daily.          BP 127/69  Pulse 79  Temp(Src) 98.8 F (37.1 C) (Oral)  Resp 18  SpO2 99%  Physical Exam  Nursing note and vitals reviewed. Constitutional: She is oriented to person, place, and time. She appears well-developed and well-nourished.  HENT:  Head: Normocephalic and atraumatic.  Mouth/Throat: Oropharynx is clear and moist.  Eyes: Conjunctivae and EOM are normal. Pupils are equal, round, and reactive to light.  Neck: Normal range of motion. Neck supple.  Cardiovascular: Normal rate, regular rhythm and normal heart sounds.   Pulmonary/Chest: Effort normal and breath sounds normal.  Musculoskeletal:       Left knee: She exhibits decreased range of motion (due to pain) and swelling. She exhibits no effusion, no ecchymosis, no deformity, no laceration and no erythema. Tenderness found.  Medial joint line tenderness noted.       Lumbar back: She exhibits decreased range of motion (due to pain), pain and spasm. She exhibits no tenderness, no bony  tenderness, no swelling, no edema, no deformity and no laceration.       Back:  Spasms of lumbar paraspinal muscles bilaterally. Left knee diffusely tender to palpation.  Limited flexion/extension with crepitus present, mild swelling along medial joint line, strong distal pulses, sensation intact  Neurological: She is alert and oriented to person, place, and time.  Skin: Skin is warm and dry.  Psychiatric: She has a normal mood and affect.    ED Course  Procedures (including critical care time)  DIAGNOSTIC STUDIES: Oxygen Saturation is 99% on room air, normal by my interpretation.    COORDINATION OF CARE: 5:55 PM-Discussed treatment plan which includes pain medication and knee sleeve application with pt at bedside and pt agreed to plan.     Labs Reviewed - No data to display  No results found.  1. Left knee pain   2. Back muscle spasm      MDM   Back pain/spasm likely due to helping husband move around the house.  No concern for cauda equina. Left knee pain is acute on chronic.  Knee sleeve applied.  Rx percocet and robaxin.  FU with Dr. Thomasena Edis.  Discussed plan with pt, she agreed.  Return precautions advised.  I personally performed the services described in this documentation, which was scribed in my presence. The recorded information has been reviewed and is accurate.  Garlon Hatchet, PA-C 08/19/12 2152

## 2012-08-22 NOTE — ED Provider Notes (Signed)
Medical screening examination/treatment/procedure(s) were performed by non-physician practitioner and as supervising physician I was immediately available for consultation/collaboration.  Candyce Churn, MD 08/22/12 [phone]

## 2012-11-03 ENCOUNTER — Other Ambulatory Visit (HOSPITAL_COMMUNITY): Payer: Self-pay | Admitting: *Deleted

## 2012-11-03 ENCOUNTER — Ambulatory Visit (HOSPITAL_COMMUNITY)
Admission: RE | Admit: 2012-11-03 | Discharge: 2012-11-03 | Disposition: A | Discharge status: Home / Self Care | Payer: Self-pay | Source: Ambulatory Visit | Attending: Specialist | Admitting: Specialist

## 2012-11-03 DIAGNOSIS — M1990 Unspecified osteoarthritis, unspecified site: Secondary | ICD-10-CM

## 2012-11-03 DIAGNOSIS — M25569 Pain in unspecified knee: Secondary | ICD-10-CM | POA: Insufficient documentation

## 2012-11-03 DIAGNOSIS — R52 Pain, unspecified: Secondary | ICD-10-CM

## 2012-11-03 DIAGNOSIS — M545 Low back pain, unspecified: Secondary | ICD-10-CM | POA: Insufficient documentation

## 2013-01-14 ENCOUNTER — Encounter (HOSPITAL_COMMUNITY): Payer: Self-pay | Admitting: Emergency Medicine

## 2013-01-14 ENCOUNTER — Emergency Department (HOSPITAL_COMMUNITY)
Admission: EM | Admit: 2013-01-14 | Discharge: 2013-01-14 | Disposition: A | Discharge status: Home / Self Care | Payer: Self-pay | Attending: Emergency Medicine | Admitting: Emergency Medicine

## 2013-01-14 DIAGNOSIS — R51 Headache: Secondary | ICD-10-CM | POA: Insufficient documentation

## 2013-01-14 DIAGNOSIS — R197 Diarrhea, unspecified: Secondary | ICD-10-CM | POA: Insufficient documentation

## 2013-01-14 DIAGNOSIS — K219 Gastro-esophageal reflux disease without esophagitis: Secondary | ICD-10-CM | POA: Insufficient documentation

## 2013-01-14 DIAGNOSIS — Z8739 Personal history of other diseases of the musculoskeletal system and connective tissue: Secondary | ICD-10-CM | POA: Insufficient documentation

## 2013-01-14 DIAGNOSIS — IMO0002 Reserved for concepts with insufficient information to code with codable children: Secondary | ICD-10-CM | POA: Insufficient documentation

## 2013-01-14 DIAGNOSIS — R1013 Epigastric pain: Secondary | ICD-10-CM | POA: Insufficient documentation

## 2013-01-14 DIAGNOSIS — I10 Essential (primary) hypertension: Secondary | ICD-10-CM | POA: Insufficient documentation

## 2013-01-14 DIAGNOSIS — E876 Hypokalemia: Secondary | ICD-10-CM | POA: Insufficient documentation

## 2013-01-14 DIAGNOSIS — E119 Type 2 diabetes mellitus without complications: Secondary | ICD-10-CM | POA: Insufficient documentation

## 2013-01-14 DIAGNOSIS — J449 Chronic obstructive pulmonary disease, unspecified: Secondary | ICD-10-CM | POA: Insufficient documentation

## 2013-01-14 DIAGNOSIS — Z9851 Tubal ligation status: Secondary | ICD-10-CM | POA: Insufficient documentation

## 2013-01-14 DIAGNOSIS — Z79899 Other long term (current) drug therapy: Secondary | ICD-10-CM | POA: Insufficient documentation

## 2013-01-14 DIAGNOSIS — Z9089 Acquired absence of other organs: Secondary | ICD-10-CM | POA: Insufficient documentation

## 2013-01-14 DIAGNOSIS — Z9889 Other specified postprocedural states: Secondary | ICD-10-CM | POA: Insufficient documentation

## 2013-01-14 DIAGNOSIS — J4489 Other specified chronic obstructive pulmonary disease: Secondary | ICD-10-CM | POA: Insufficient documentation

## 2013-01-14 DIAGNOSIS — R1110 Vomiting, unspecified: Secondary | ICD-10-CM | POA: Insufficient documentation

## 2013-01-14 DIAGNOSIS — Z87891 Personal history of nicotine dependence: Secondary | ICD-10-CM | POA: Insufficient documentation

## 2013-01-14 DIAGNOSIS — R6883 Chills (without fever): Secondary | ICD-10-CM | POA: Insufficient documentation

## 2013-01-14 LAB — CBC WITH DIFFERENTIAL/PLATELET
Basophils Absolute: 0 10*3/uL (ref 0.0–0.1)
Basophils Relative: 0 % (ref 0–1)
Eosinophils Absolute: 0.1 10*3/uL (ref 0.0–0.7)
Eosinophils Relative: 1 % (ref 0–5)
HCT: 38 % (ref 36.0–46.0)
Hemoglobin: 13.3 g/dL (ref 12.0–15.0)
Lymphocytes Relative: 44 % (ref 12–46)
Lymphs Abs: 3.7 10*3/uL (ref 0.7–4.0)
MCH: 28.4 pg (ref 26.0–34.0)
MCHC: 35 g/dL (ref 30.0–36.0)
MCV: 81.2 fL (ref 78.0–100.0)
Monocytes Absolute: 0.5 10*3/uL (ref 0.1–1.0)
Monocytes Relative: 6 % (ref 3–12)
Neutro Abs: 4.2 10*3/uL (ref 1.7–7.7)
Neutrophils Relative %: 50 % (ref 43–77)
Platelets: 253 10*3/uL (ref 150–400)
RBC: 4.68 MIL/uL (ref 3.87–5.11)
RDW: 13.9 % (ref 11.5–15.5)
WBC: 8.5 10*3/uL (ref 4.0–10.5)

## 2013-01-14 LAB — URINE MICROSCOPIC-ADD ON

## 2013-01-14 LAB — COMPREHENSIVE METABOLIC PANEL
ALT: 14 U/L (ref 0–35)
AST: 20 U/L (ref 0–37)
Albumin: 4.3 g/dL (ref 3.5–5.2)
Alkaline Phosphatase: 131 U/L — ABNORMAL HIGH (ref 39–117)
BUN: 11 mg/dL (ref 6–23)
CO2: 27 mEq/L (ref 19–32)
Calcium: 9.9 mg/dL (ref 8.4–10.5)
Chloride: 96 mEq/L (ref 96–112)
Creatinine, Ser: 0.9 mg/dL (ref 0.50–1.10)
GFR calc Af Amer: 83 mL/min — ABNORMAL LOW (ref >90)
GFR calc non Af Amer: 72 mL/min — ABNORMAL LOW (ref >90)
Glucose, Bld: 106 mg/dL — ABNORMAL HIGH (ref 70–99)
Potassium: 3.3 mEq/L — ABNORMAL LOW (ref 3.5–5.1)
Sodium: 136 mEq/L (ref 135–145)
Total Bilirubin: 0.7 mg/dL (ref 0.3–1.2)
Total Protein: 8.3 g/dL (ref 6.0–8.3)

## 2013-01-14 LAB — URINALYSIS, ROUTINE W REFLEX MICROSCOPIC
Glucose, UA: NEGATIVE mg/dL
Hgb urine dipstick: NEGATIVE
Ketones, ur: NEGATIVE mg/dL
Nitrite: NEGATIVE
Protein, ur: NEGATIVE mg/dL
Specific Gravity, Urine: 1.023 (ref 1.005–1.030)
Urobilinogen, UA: 1 mg/dL (ref 0.0–1.0)
pH: 6 (ref 5.0–8.0)

## 2013-01-14 LAB — LIPASE, BLOOD: Lipase: 27 U/L (ref 11–59)

## 2013-01-14 MED ORDER — SODIUM CHLORIDE 0.9 % IV BOLUS (SEPSIS)
1000.0000 mL | Freq: Once | INTRAVENOUS | Status: AC
  Administered 2013-01-14: 1000 mL via INTRAVENOUS

## 2013-01-14 MED ORDER — ACETAMINOPHEN 500 MG PO TABS
1000.0000 mg | ORAL_TABLET | Freq: Once | ORAL | Status: AC
  Administered 2013-01-14: 1000 mg via ORAL
  Filled 2013-01-14: qty 2

## 2013-01-14 MED ORDER — ONDANSETRON HCL 4 MG/2ML IJ SOLN
4.0000 mg | Freq: Once | INTRAMUSCULAR | Status: AC
  Administered 2013-01-14: 4 mg via INTRAVENOUS
  Filled 2013-01-14: qty 2

## 2013-01-14 MED ORDER — POTASSIUM CHLORIDE CRYS ER 20 MEQ PO TBCR
40.0000 meq | EXTENDED_RELEASE_TABLET | Freq: Once | ORAL | Status: AC
  Administered 2013-01-14: 40 meq via ORAL
  Filled 2013-01-14: qty 2

## 2013-01-14 MED ORDER — ONDANSETRON HCL 4 MG PO TABS: 4.0000 mg | ORAL_TABLET | Freq: Four times a day (QID) | ORAL | Status: DC | PRN

## 2013-01-14 NOTE — ED Notes (Signed)
She c/o few episodes per day of n/v/d since this Tues.  She is in no distress.  She cites hx of diabetes & I.B.S.; and has been unable to obtain her usual meds since the beginning of this year d/t financial reasons.

## 2013-01-14 NOTE — ED Provider Notes (Signed)
CSN: [id number]     Arrival date & time 01/14/13  1730 History   First MD Initiated Contact with Patient 01/14/13 1801     Chief Complaint  Patient presents with  . Emesis   (Consider location/radiation/quality/duration/timing/severity/associated sxs/prior Treatment) HPI Comments: 52 year old female presents with nausea, vomiting, and diarrhea over the past 3-4 days. She's had chills but has not taken her temperature and noticed a fever. She's had multiple vomiting episodes and not been able to keep down any oral fluids or food. She states after vomiting today showed low but of epigastric abdominal pain but otherwise has not had pain. She has a history of IBS and feels like this is similar to that except more severe. No blood or mucus in her stool. No melanotic stools. She's also starting to get a headache over the past 2 days as well. She is lightheaded when she stood up once but denies any current lightheadedness. She states that she's not had any abnormal or worrisome food intake, no recent antibiotics, and no travel.  Patient is a 52 y.o. female presenting with vomiting.  Emesis Associated symptoms: abdominal pain, chills, diarrhea and headaches     Past Medical History  Diagnosis Date  . Hypertension     Pt on lisinopril  . GERD (gastroesophageal reflux disease)     Pt on Protonix daily  . Arthritis     Takes Naprosyn   . Diabetes mellitus     Takes Metformin 750 mg  . Hyperlipidemia   . Asthma   . COPD (chronic obstructive pulmonary disease)   . Shortness of breath     occasional - uses breathing tx at home  . Headache(784.0)     otc meds prn  . Irritable bowel syndrome 11/19/2010   Past Surgical History  Procedure Laterality Date  . Hernia repair    . Cholecystectomy    . Svd       x 2  . Tubal ligation    . Endometrial ablation  10/2010  . Upper gastrointestinal endoscopy  04/28/11   Family History  Problem Relation Age of Onset  . Asthma Son     had as a child   . Heart disease Sister   . Breast cancer Sister    History  Substance Use Topics  . Smoking status: Former Smoker -- 1.00 packs/day for 10 years    Types: Cigarettes    Quit date: 09/28/2000  . Smokeless tobacco: Never Used  . Alcohol Use: No   OB History   Grav Para Term Preterm Abortions TAB SAB Ect Mult Living                 Review of Systems  Constitutional: Positive for chills.  Respiratory: Negative for shortness of breath.   Cardiovascular: Negative for chest pain.  Gastrointestinal: Positive for nausea, vomiting, abdominal pain and diarrhea. Negative for blood in stool and anal bleeding.  Genitourinary: Negative for dysuria.  Neurological: Positive for headaches. Negative for weakness.  All other systems reviewed and are negative.    Allergies  Other; Aspirin; and Ace inhibitors  Home Medications   Current Outpatient Rx  Name  Route  Sig  Dispense  Refill  . colchicine 0.6 MG tablet   Oral   Take 0.6 mg by mouth daily.         Marland Kitchen EPINEPHrine (EPI-PEN) 0.3 mg/0.3 mL DEVI   Intramuscular   Inject 0.3 mg into the muscle once.         Marland Kitchen  albuterol (PROVENTIL) (2.5 MG/3ML) 0.083% nebulizer solution   Nebulization   Take 3 mLs (2.5 mg total) by nebulization every 4 (four) hours as needed for wheezing.   75 mL   1   . EXPIRED: budesonide-formoterol (SYMBICORT) 80-4.5 MCG/ACT inhaler   Inhalation   Inhale 2 puffs into the lungs 2 (two) times daily.   1 Inhaler   6   . EXPIRED: famotidine (PEPCID) 20 MG tablet      One at bedtime   30 tablet   11   . mometasone-formoterol (DULERA) 100-5 MCG/ACT AERO   Inhalation   Inhale 2 puffs into the lungs 2 (two) times daily.   1 Inhaler   11    BP 141/78  Pulse 87  Temp(Src) 99.1 F (37.3 C) (Oral)  Resp 16  SpO2 97% Physical Exam  Nursing note and vitals reviewed. Constitutional: She is oriented to person, place, and time. She appears well-developed and well-nourished. No distress.  HENT:  Head:  Normocephalic and atraumatic.  Right Ear: External ear normal.  Left Ear: External ear normal.  Nose: Nose normal.  Eyes: Right eye exhibits no discharge. Left eye exhibits no discharge.  Cardiovascular: Normal rate, regular rhythm and normal heart sounds.   Pulmonary/Chest: Effort normal and breath sounds normal.  Abdominal: Soft. There is tenderness in the epigastric area.  Neurological: She is alert and oriented to person, place, and time.  Skin: Skin is warm and dry.    ED Course  Procedures (including critical care time) Labs Review Labs Reviewed  COMPREHENSIVE METABOLIC PANEL - Abnormal; Notable for the following:    Potassium 3.3 (*)    Glucose, Bld 106 (*)    Alkaline Phosphatase 131 (*)    GFR calc non Af Amer 72 (*)    GFR calc Af Amer 83 (*)    All other components within normal limits  URINALYSIS, ROUTINE W REFLEX MICROSCOPIC - Abnormal; Notable for the following:    APPearance CLOUDY (*)    Bilirubin Urine SMALL (*)    Leukocytes, UA MODERATE (*)    All other components within normal limits  URINE MICROSCOPIC-ADD ON - Abnormal; Notable for the following:    Squamous Epithelial / LPF MANY (*)    Bacteria, UA FEW (*)    All other components within normal limits  URINE CULTURE  CBC WITH DIFFERENTIAL  LIPASE, BLOOD   Imaging Review No results found.  EKG Interpretation   None       Date: 01/14/2013  Rate: 68  Rhythm: normal sinus rhythm  QRS Axis: normal  Intervals: normal  ST/T Wave abnormalities: nonspecific T wave changes  Conduction Disutrbances:none  Narrative Interpretation: No change from 2012  Old EKG Reviewed: unchanged   MDM   1. Vomiting and diarrhea   2. Hypokalemia    Patient appears well, feels significantly improved after Zofran and fluids. She is orthostatic here but feels better after the fluids. Abdominal exam is benign. Is likely an exacerbation of her IBS or a viral gastroenteritis. Will discharge with small amount of Zofran for  nausea as needed. She is no red flags such as fevers, bloody stools, or recent antibiotic use or travel. Discussed strict return precautions she will follow up with PCP as needed.    Audree Camel, MD 01/14/13 2222

## 2013-01-16 LAB — URINE CULTURE: Colony Count: 25000

## 2013-02-07 ENCOUNTER — Ambulatory Visit: Payer: Self-pay | Attending: Internal Medicine

## 2013-02-16 ENCOUNTER — Encounter: Payer: Self-pay | Admitting: Internal Medicine

## 2013-02-16 ENCOUNTER — Ambulatory Visit: Payer: Self-pay | Attending: Internal Medicine | Admitting: Internal Medicine

## 2013-02-16 VITALS — BP 142/86 | HR 74 | Temp 97.9°F | Resp 17 | Wt 176.4 lb

## 2013-02-16 DIAGNOSIS — J449 Chronic obstructive pulmonary disease, unspecified: Secondary | ICD-10-CM

## 2013-02-16 DIAGNOSIS — K219 Gastro-esophageal reflux disease without esophagitis: Secondary | ICD-10-CM | POA: Insufficient documentation

## 2013-02-16 DIAGNOSIS — I10 Essential (primary) hypertension: Secondary | ICD-10-CM | POA: Insufficient documentation

## 2013-02-16 DIAGNOSIS — E119 Type 2 diabetes mellitus without complications: Secondary | ICD-10-CM | POA: Insufficient documentation

## 2013-02-16 DIAGNOSIS — Z139 Encounter for screening, unspecified: Secondary | ICD-10-CM

## 2013-02-16 LAB — GLUCOSE, POCT (MANUAL RESULT ENTRY): POC Glucose: 136 mg/dl — AB (ref 70–99)

## 2013-02-16 LAB — POCT GLYCOSYLATED HEMOGLOBIN (HGB A1C): Hemoglobin A1C: 6.4

## 2013-02-16 MED ORDER — METFORMIN HCL ER 750 MG PO TB24: 750.0000 mg | ORAL_TABLET | Freq: Every day | ORAL | Status: DC

## 2013-02-16 MED ORDER — METOCLOPRAMIDE HCL 5 MG PO TABS: 5.0000 mg | ORAL_TABLET | Freq: Three times a day (TID) | ORAL | Status: DC

## 2013-02-16 MED ORDER — "ALLERGY SYRINGE 28G X 1/2"" 1 ML MISC": Status: DC

## 2013-02-16 MED ORDER — PANTOPRAZOLE SODIUM 40 MG PO TBEC: 40.0000 mg | DELAYED_RELEASE_TABLET | Freq: Every day | ORAL | Status: DC

## 2013-02-16 MED ORDER — HYDROXYZINE HCL 10 MG PO TABS: 10.0000 mg | ORAL_TABLET | Freq: Three times a day (TID) | ORAL | Status: DC | PRN

## 2013-02-16 MED ORDER — FREESTYLE SYSTEM KIT: 1.0000 | PACK | Status: DC | PRN

## 2013-02-16 MED ORDER — VALSARTAN-HYDROCHLOROTHIAZIDE 160-12.5 MG PO TABS: 1.0000 | ORAL_TABLET | Freq: Every day | ORAL | Status: DC

## 2013-02-16 MED ORDER — GLUCOCOM LANCETS 28G MISC: Status: DC

## 2013-02-16 NOTE — Progress Notes (Signed)
Patient Demographics  Stacy [name], is a 53 y.o. female  ZOX:[id number]  WJX:[id number]  DOB - May 13, 1960  CC:  Chief Complaint  Patient presents with  . Diabetes       HPI: Stacy [name] is a 53 y.o. female here today to establish medical care. Patient has history of hypertension diabetes GERD COPD IBS, she also recently went to the ER with nausea vomiting diarrhea patient reports improvement in the symptoms. Patient also used to follow up with her pulmonologist. Patient has No headache, No chest pain, No abdominal pain - No Nausea, No new weakness tingling or numbness, No Cough - SOB.  Allergies  Allergen Reactions  . Other Shortness Of Breath    Allergic to perfumes and cleaning products  . Aspirin Nausea Only    unknown  . Ace Inhibitors     Cough, wheeze   Past Medical History  Diagnosis Date  . Hypertension     Pt on lisinopril  . GERD (gastroesophageal reflux disease)     Pt on Protonix daily  . Arthritis     Takes Naprosyn   . Diabetes mellitus     Takes Metformin 750 mg  . Hyperlipidemia   . Asthma   . COPD (chronic obstructive pulmonary disease)   . Shortness of breath     occasional - uses breathing tx at home  . Headache(784.0)     otc meds prn  . Irritable bowel syndrome 11/19/2010   Current Outpatient Prescriptions on File Prior to Visit  Medication Sig Dispense Refill  . albuterol (PROVENTIL) (2.5 MG/3ML) 0.083% nebulizer solution Take 3 mLs (2.5 mg total) by nebulization every 4 (four) hours as needed for wheezing.  75 mL  1  . budesonide-formoterol (SYMBICORT) 80-4.5 MCG/ACT inhaler Inhale 2 puffs into the lungs 2 (two) times daily.  1 Inhaler  6  . colchicine 0.6 MG tablet Take 0.6 mg by mouth daily.      Marland Kitchen EPINEPHrine (EPI-PEN) 0.3 mg/0.3 mL DEVI Inject 0.3 mg into the muscle once.      . famotidine (PEPCID) 20 MG tablet One at bedtime  30 tablet  11  . mometasone-formoterol (DULERA) 100-5 MCG/ACT AERO Inhale 2 puffs into the lungs 2 (two)  times daily.  1 Inhaler  11  . ondansetron (ZOFRAN) 4 MG tablet Take 1 tablet (4 mg total) by mouth every 6 (six) hours as needed for nausea or vomiting.  10 tablet  0  . [DISCONTINUED] Fluticasone-Salmeterol (ADVAIR) 500-50 MCG/DOSE AEPB Inhale 1 puff into the lungs every 12 (twelve) hours.        . [DISCONTINUED] hydrochlorothiazide (,MICROZIDE/HYDRODIURIL,) 12.5 MG capsule Take 12.5 mg by mouth daily.        . [DISCONTINUED] lisinopril (PRINIVIL,ZESTRIL) 40 MG tablet Take 40 mg by mouth daily.         No current facility-administered medications on file prior to visit.   Family History  Problem Relation Age of Onset  . Asthma Son     had as a child  . Heart disease Sister   . Breast cancer Sister   . Hypertension Father   . Cancer Father   . Hypertension Brother    History   Social History  . Marital Status: Single    Spouse Name: N/A    Number of Children: 2  . Years of Education: N/A   Occupational History  . unemployed    Social History Main Topics  . Smoking status: Former Smoker -- 1.00 packs/day for 10 years  Types: Cigarettes    Quit date: 09/28/2000  . Smokeless tobacco: Never Used  . Alcohol Use: No  . Drug Use: No  . Sexual Activity: Yes    Birth Control/ Protection: Surgical   Other Topics Concern  . Not on file   Social History Narrative  . No narrative on file    Review of Systems: Constitutional: Negative for fever, chills, diaphoresis, activity change, appetite change and fatigue. HENT: Negative for ear pain, nosebleeds, congestion, facial swelling, rhinorrhea, neck pain, neck stiffness and ear discharge.  Eyes: Negative for pain, discharge, redness, itching and visual disturbance. Respiratory: Negative for cough, choking, chest tightness, shortness of breath, wheezing and stridor.  Cardiovascular: Negative for chest pain, palpitations and leg swelling. Gastrointestinal: Negative for abdominal distention. Genitourinary: Negative for dysuria,  urgency, frequency, hematuria, flank pain, decreased urine volume, difficulty urinating and dyspareunia.  Musculoskeletal: Positive for back pain, joint swelling, arthralgia and gait problem. Neurological: Negative for dizziness, tremors, seizures, syncope, facial asymmetry, speech difficulty, weakness, light-headedness, numbness and headaches.  Hematological: Negative for adenopathy. Does not bruise/bleed easily. Psychiatric/Behavioral: Negative for hallucinations, behavioral problems, confusion, dysphoric mood, decreased concentration and agitation.    Objective:   Filed Vitals:   02/16/13 1146  BP: 142/86  Pulse: 74  Temp: 97.9 F (36.6 C)  Resp: 17    Physical Exam: Constitutional: Patient appears well-developed and well-nourished. No distress. HENT: Normocephalic, atraumatic, External right and left ear normal. Oropharynx is clear and moist.  Eyes: Conjunctivae and EOM are normal. PERRLA, no scleral icterus. Neck: Normal ROM. Neck supple. No JVD. No tracheal deviation. No thyromegaly. CVS: RRR, S1/S2 +, no murmurs, no gallops, no carotid bruit.  Pulmonary: Effort and breath sounds normal, no stridor, rhonchi, wheezes, rales.  Abdominal: Soft. BS +, no distension, tenderness, rebound or guarding.  Musculoskeletal: Normal range of motion. No edema and no tenderness.  Neuro: Alert. Normal reflexes, muscle tone coordination. No cranial nerve deficit. Skin: Skin is warm and dry. No rash noted. Not diaphoretic. No erythema. No pallor. Psychiatric: Normal mood and affect. Behavior, judgment, thought content normal.  Lab Results  Component Value Date   WBC 8.5 01/14/2013   HGB 13.3 01/14/2013   HCT 38.0 01/14/2013   MCV 81.2 01/14/2013   PLT 253 01/14/2013   Lab Results  Component Value Date   CREATININE 0.90 01/14/2013   BUN 11 01/14/2013   NA 136 01/14/2013   K 3.3* 01/14/2013   CL 96 01/14/2013   CO2 27 01/14/2013    Lab Results  Component Value Date   HGBA1C 6.4  02/16/2013   Lipid Panel     Component Value Date/Time   CHOL  Value: 143        ATP III CLASSIFICATION:  <200     mg/dL   Desirable  161-096  mg/dL   Borderline High  >=045    mg/dL   High        4/0/9811 0410   TRIG 87 02/07/2010 0410   HDL 43 02/07/2010 0410   CHOLHDL 3.3 02/07/2010 0410   VLDL 17 02/07/2010 0410   LDLCALC  Value: 83        Total Cholesterol/HDL:CHD Risk Coronary Heart Disease Risk Table                     Men   Women  1/2 Average Risk   3.4   3.3  Average Risk       5.0   4.4  2 X  Average Risk   9.6   7.1  3 X Average Risk  23.4   11.0        Use the calculated Patient Ratio above and the CHD Risk Table to determine the patient's CHD Risk.        ATP III CLASSIFICATION (LDL):  <100     mg/dL   Optimal  010-272100-129  mg/dL   Near or Above                    Optimal  130-159  mg/dL   Borderline  536-644160-189  mg/dL   High  >034>190     mg/dL   Very High 7/4/25951/07/2010 [id number]       Assessment and plan:   1. DM (diabetes mellitus)  - PR BLOOD GLUCOSE TEST STRIPS - Glucose (CBG) - HgB A1c 6.4% - Ambulatory referral to Ophthalmology - Ambulatory referral to Podiatry  2. Hypertension Continue Diovan. - COMPLETE METABOLIC PANEL WITH GFR  3. GERD (gastroesophageal reflux disease) Continue with Protonix.  4. Screening  - COMPLETE METABOLIC PANEL WITH GFR - TSH - Lipid panel - Vit D  25 hydroxy (rtn osteoporosis monitoring) - MM Digital Screening; Future  COPD Continue with dulera and albuterol when necessary and follow with pulmonology.      Health Maintenance -Colonoscopy: As per patient she had colonoscopy done 2 years ago.  -Mammogram: Ordered  Return in about 6 weeks (around 03/30/2013).   Doris CheadleADVANI, Amyra Vantuyl, MD

## 2013-02-16 NOTE — Progress Notes (Signed)
Patient here for follow up DM Needs medication refills 

## 2013-02-17 ENCOUNTER — Telehealth: Payer: Self-pay

## 2013-02-17 LAB — LIPID PANEL
Cholesterol: 204 mg/dL — ABNORMAL HIGH (ref 0–200)
HDL: 44 mg/dL (ref >39)
LDL Cholesterol: 120 mg/dL — ABNORMAL HIGH (ref 0–99)
Total CHOL/HDL Ratio: 4.6 Ratio
Triglycerides: 199 mg/dL — ABNORMAL HIGH (ref <150)
VLDL: 40 mg/dL (ref 0–40)

## 2013-02-17 LAB — COMPLETE METABOLIC PANEL WITH GFR
ALT: 11 U/L (ref 0–35)
AST: 17 U/L (ref 0–37)
Albumin: 4.3 g/dL (ref 3.5–5.2)
Alkaline Phosphatase: 114 U/L (ref 39–117)
BUN: 8 mg/dL (ref 6–23)
CO2: 26 mEq/L (ref 19–32)
Calcium: 9.9 mg/dL (ref 8.4–10.5)
Chloride: 102 mEq/L (ref 96–112)
Creat: 0.75 mg/dL (ref 0.50–1.10)
GFR, Est African American: >89 mL/min
GFR, Est Non African American: >89 mL/min
Glucose, Bld: 91 mg/dL (ref 70–99)
Potassium: 4.1 mEq/L (ref 3.5–5.3)
Sodium: 141 mEq/L (ref 135–145)
Total Bilirubin: 0.2 mg/dL — ABNORMAL LOW (ref 0.3–1.2)
Total Protein: 7.3 g/dL (ref 6.0–8.3)

## 2013-02-17 LAB — VITAMIN D 25 HYDROXY (VIT D DEFICIENCY, FRACTURES): Vit D, 25-Hydroxy: 12 ng/mL — ABNORMAL LOW (ref 30–89)

## 2013-02-17 LAB — TSH: TSH: 1.761 u[IU]/mL (ref 0.350–4.500)

## 2013-02-17 MED ORDER — VITAMIN D (ERGOCALCIFEROL) 1.25 MG (50000 UNIT) PO CAPS: 50000.0000 [IU] | ORAL_CAPSULE | ORAL | Status: DC

## 2013-02-17 NOTE — Telephone Encounter (Signed)
Message copied by Lestine MountJUAREZ, Wretha Laris L on Fri Feb 17, 2013  3:37 PM ------      Message from: Doris CheadleADVANI, DEEPAK      Created: Fri Feb 17, 2013  9:51 AM       Blood work reviewed, noticed low vitamin D, call patient advise to start ergocalciferol 50,000 units once a week for the duration of  12 weeks.       ------

## 2013-02-17 NOTE — Telephone Encounter (Signed)
Left message on voicemail to call the office

## 2013-03-30 ENCOUNTER — Ambulatory Visit: Payer: Self-pay | Admitting: Internal Medicine

## 2013-04-03 ENCOUNTER — Ambulatory Visit: Payer: No Typology Code available for payment source | Attending: Internal Medicine | Admitting: Internal Medicine

## 2013-04-03 ENCOUNTER — Encounter: Payer: Self-pay | Admitting: Internal Medicine

## 2013-04-03 VITALS — BP 124/79 | HR 86 | Temp 98.9°F | Resp 14 | Ht 66.0 in | Wt 179.6 lb

## 2013-04-03 DIAGNOSIS — Z87891 Personal history of nicotine dependence: Secondary | ICD-10-CM | POA: Insufficient documentation

## 2013-04-03 DIAGNOSIS — I10 Essential (primary) hypertension: Secondary | ICD-10-CM | POA: Insufficient documentation

## 2013-04-03 DIAGNOSIS — G8929 Other chronic pain: Secondary | ICD-10-CM | POA: Insufficient documentation

## 2013-04-03 DIAGNOSIS — J449 Chronic obstructive pulmonary disease, unspecified: Secondary | ICD-10-CM | POA: Insufficient documentation

## 2013-04-03 DIAGNOSIS — J4489 Other specified chronic obstructive pulmonary disease: Secondary | ICD-10-CM | POA: Insufficient documentation

## 2013-04-03 DIAGNOSIS — E785 Hyperlipidemia, unspecified: Secondary | ICD-10-CM | POA: Insufficient documentation

## 2013-04-03 DIAGNOSIS — M545 Low back pain, unspecified: Secondary | ICD-10-CM

## 2013-04-03 DIAGNOSIS — K219 Gastro-esophageal reflux disease without esophagitis: Secondary | ICD-10-CM | POA: Insufficient documentation

## 2013-04-03 DIAGNOSIS — M25569 Pain in unspecified knee: Secondary | ICD-10-CM | POA: Insufficient documentation

## 2013-04-03 DIAGNOSIS — E119 Type 2 diabetes mellitus without complications: Secondary | ICD-10-CM | POA: Insufficient documentation

## 2013-04-03 MED ORDER — FREESTYLE LANCETS MISC: Status: DC

## 2013-04-03 MED ORDER — CYCLOBENZAPRINE HCL 10 MG PO TABS: 10.0000 mg | ORAL_TABLET | Freq: Every day | ORAL | Status: DC

## 2013-04-03 MED ORDER — GABAPENTIN 100 MG PO CAPS: 100.0000 mg | ORAL_CAPSULE | Freq: Three times a day (TID) | ORAL | Status: DC

## 2013-04-03 NOTE — Progress Notes (Signed)
Patient ID: Stacy [name], female   DOB: 1960/02/17, 53 y.o.   MRN: [id number]   CC:  HPI: 53 year old female with a history of diabetes, hypertension presents with chief complaint of low back pain. She also complains of bilateral knee pain. The pains in her back started 4 weeks ago. The patient denies any history of trauma denies any urinary or stool incontinence. The patient is currently also complaining off parasthesia in both her feet. She is a diabetic. Last hemoglobin A1c was 6.4. Accu-Cheks have remained stable.  She was seen primary care physician in [facility]. Last lumbar x-rays 10/14 showed degenerative changes.   Allergies  Allergen Reactions  . Other Shortness Of Breath    Allergic to perfumes and cleaning products  . Aspirin Nausea Only    unknown  . Ace Inhibitors     Cough, wheeze   Past Medical History  Diagnosis Date  . Hypertension     Pt on lisinopril  . GERD (gastroesophageal reflux disease)     Pt on Protonix daily  . Arthritis     Takes Naprosyn   . Diabetes mellitus     Takes Metformin 750 mg  . Hyperlipidemia   . Asthma   . COPD (chronic obstructive pulmonary disease)   . Shortness of breath     occasional - uses breathing tx at home  . Headache(784.0)     otc meds prn  . Irritable bowel syndrome 11/19/2010   Current Outpatient Prescriptions on File Prior to Visit  Medication Sig Dispense Refill  . albuterol (PROVENTIL) (2.5 MG/3ML) 0.083% nebulizer solution Take 3 mLs (2.5 mg total) by nebulization every 4 (four) hours as needed for wheezing.  75 mL  1  . colchicine 0.6 MG tablet Take 0.6 mg by mouth daily.      Marland Kitchen EPINEPHrine (EPI-PEN) 0.3 mg/0.3 mL DEVI Inject 0.3 mg into the muscle once.      . GlucoCom Lancets MISC As directed TID before meals and QHS  100 each  2  . glucose monitoring kit (FREESTYLE) monitoring kit 1 each by Does not apply route as needed for other.  1 each  0  . hydrOXYzine (ATARAX/VISTARIL) 10 MG tablet Take 1 tablet (10 mg  total) by mouth 3 (three) times daily as needed.  30 tablet  2  . metFORMIN (GLUCOPHAGE-XR) 750 MG 24 hr tablet Take 1 tablet (750 mg total) by mouth daily with breakfast.  30 tablet  2  . metoCLOPramide (REGLAN) 5 MG tablet Take 1 tablet (5 mg total) by mouth 3 (three) times daily.  90 tablet  2  . mometasone-formoterol (DULERA) 100-5 MCG/ACT AERO Inhale 2 puffs into the lungs 2 (two) times daily.  1 Inhaler  11  . ondansetron (ZOFRAN) 4 MG tablet Take 1 tablet (4 mg total) by mouth every 6 (six) hours as needed for nausea or vomiting.  10 tablet  0  . pantoprazole (PROTONIX) 40 MG tablet Take 1 tablet (40 mg total) by mouth daily.  30 tablet  2  . Tuberculin-Allergy Syringes (ALLERGY SYRINGE 1CC/28GX1/2") 28G X 1/2" 1 ML MISC As directed by allergist  50 each  2  . valsartan-hydrochlorothiazide (DIOVAN-HCT) 160-12.5 MG per tablet Take 1 tablet by mouth daily.  30 tablet  2  . Vitamin D, Ergocalciferol, (DRISDOL) 50000 UNITS CAPS capsule Take 1 capsule (50,000 Units total) by mouth every 7 (seven) days.  12 capsule  0  . budesonide-formoterol (SYMBICORT) 80-4.5 MCG/ACT inhaler Inhale 2 puffs into the lungs  2 (two) times daily.  1 Inhaler  6  . famotidine (PEPCID) 20 MG tablet One at bedtime  30 tablet  11  . [DISCONTINUED] Fluticasone-Salmeterol (ADVAIR) 500-50 MCG/DOSE AEPB Inhale 1 puff into the lungs every 12 (twelve) hours.        . [DISCONTINUED] hydrochlorothiazide (,MICROZIDE/HYDRODIURIL,) 12.5 MG capsule Take 12.5 mg by mouth daily.        . [DISCONTINUED] lisinopril (PRINIVIL,ZESTRIL) 40 MG tablet Take 40 mg by mouth daily.         No current facility-administered medications on file prior to visit.   Family History  Problem Relation Age of Onset  . Asthma Son     had as a child  . Heart disease Sister   . Breast cancer Sister   . Hypertension Father   . Cancer Father   . Hypertension Brother    History   Social History  . Marital Status: Single    Spouse Name: N/A    Number  of Children: 2  . Years of Education: N/A   Occupational History  . unemployed    Social History Main Topics  . Smoking status: Former Smoker -- 1.00 packs/day for 10 years    Types: Cigarettes    Quit date: 09/28/2000  . Smokeless tobacco: Never Used  . Alcohol Use: No  . Drug Use: No  . Sexual Activity: Yes    Birth Control/ Protection: Surgical   Other Topics Concern  . Not on file   Social History Narrative  . No narrative on file    Review of Systems  Constitutional: Negative for fever, chills, diaphoresis, activity change, appetite change and fatigue.  HENT: Negative for ear pain, nosebleeds, congestion, facial swelling, rhinorrhea, neck pain, neck stiffness and ear discharge.   Eyes: Negative for pain, discharge, redness, itching and visual disturbance.  Respiratory: Negative for cough, choking, chest tightness, shortness of breath, wheezing and stridor.   Cardiovascular: Negative for chest pain, palpitations and leg swelling.  Gastrointestinal: Negative for abdominal distention.  Genitourinary: Negative for dysuria, urgency, frequency, hematuria, flank pain, decreased urine volume, difficulty urinating and dyspareunia.  Musculoskeletal: As in history of present illness Neurological: Negative for dizziness, tremors, seizures, syncope, facial asymmetry, speech difficulty, weakness, light-headedness, numbness and headaches.  Hematological: Negative for adenopathy. Does not bruise/bleed easily.  Psychiatric/Behavioral: Negative for hallucinations, behavioral problems, confusion, dysphoric mood, decreased concentration and agitation.    Objective:   Filed Vitals:   04/03/13 0923  BP: 124/79  Pulse: 86  Temp: 98.9 F (37.2 C)  Resp: 14    Physical Exam  Constitutional: Appears well-developed and well-nourished. No distress.  HENT: Normocephalic. External right and left ear normal. Oropharynx is clear and moist.  Eyes: Conjunctivae and EOM are normal. PERRLA, no  scleral icterus.  Neck: Normal ROM. Neck supple. No JVD. No tracheal deviation. No thyromegaly.  CVS: RRR, S1/S2 +, no murmurs, no gallops, no carotid bruit.  Pulmonary: Effort and breath sounds normal, no stridor, rhonchi, wheezes, rales.  Abdominal: Soft. BS +,  no distension, tenderness, rebound or guarding.  Musculoskeletal: Normal range of motion. No edema and point tenderness of lumbar spine  Lymphadenopathy: No lymphadenopathy noted, cervical, inguinal. Neuro: Alert. Normal reflexes, muscle tone coordination. No cranial nerve deficit. Skin: Skin is warm and dry. No rash noted. Not diaphoretic. No erythema. No pallor.  Psychiatric: Normal mood and affect. Behavior, judgment, thought content normal.   Lab Results  Component Value Date   WBC 8.5 01/14/2013   HGB 13.3  01/14/2013   HCT 38.0 01/14/2013   MCV 81.2 01/14/2013   PLT 253 01/14/2013   Lab Results  Component Value Date   CREATININE 0.75 02/16/2013   BUN 8 02/16/2013   NA 141 02/16/2013   K 4.1 02/16/2013   CL 102 02/16/2013   CO2 26 02/16/2013    Lab Results  Component Value Date   HGBA1C 6.4 02/16/2013   Lipid Panel     Component Value Date/Time   CHOL 204* 02/16/2013 1223   TRIG 199* 02/16/2013 1223   HDL 44 02/16/2013 1223   CHOLHDL 4.6 02/16/2013 1223   VLDL 40 02/16/2013 1223   LDLCALC 120* 02/16/2013 1223       Assessment and plan:   Patient Active Problem List   Diagnosis Date Noted  . GERD (gastroesophageal reflux disease) 02/16/2013  . DM (diabetes mellitus) 02/16/2013  . COPD (chronic obstructive pulmonary disease) 04/17/2011  . Hypertension 04/17/2011  . Cough 04/16/2011       Acute on chronic back pain Patient had plain radiograph that showed degenerative changes She has never had MRI For pain control we'll start the patient on Flexeril and gabapentin Sports medicine referral MRI of the lumbar spine  Diabetes Lancets provided  Patient followup in 6 weeks once workup is complete   The  patient was given clear instructions to go to ER or return to medical center if symptoms don't improve, worsen or new problems develop. The patient verbalized understanding. The patient was told to call to get any lab results if not heard anything in the next week.

## 2013-04-03 NOTE — Progress Notes (Signed)
Patient is here for a follow up visit. Complains of lower back pain, Lt knee pain x3 years. Pain scale of 9 today. No OTC medication for pain. Slight headaches, no nausea. Patient is a diabetic. Also complains of possible neuropathy in bilateral feet. Patient checked her glucose this morning with a reading of 102. Patient needs 6 medication refills.

## 2013-04-03 NOTE — Addendum Note (Signed)
Addended by: Tahnee Cifuentes, [place] on: 04/03/2013 10:32 AM   Modules accepted: Orders

## 2013-04-04 LAB — BASIC METABOLIC PANEL
BUN: 9 mg/dL (ref 6–23)
CO2: 26 mEq/L (ref 19–32)
Calcium: 10 mg/dL (ref 8.4–10.5)
Chloride: 100 mEq/L (ref 96–112)
Creat: 1.06 mg/dL (ref 0.50–1.10)
Glucose, Bld: 116 mg/dL — ABNORMAL HIGH (ref 70–99)
Potassium: 3.7 mEq/L (ref 3.5–5.3)
Sodium: 140 mEq/L (ref 135–145)

## 2013-04-07 ENCOUNTER — Telehealth: Payer: Self-pay | Admitting: *Deleted

## 2013-04-07 NOTE — Telephone Encounter (Signed)
Contacted patient to notify her of her lab results. Informed patient of results as followed. Call completed.

## 2013-04-07 NOTE — Telephone Encounter (Signed)
Message copied by Ebubechukwu Jedlicka, [place] R on Fri Apr 07, 2013  2:24 PM ------      Message from: Susie CassetteABROL MD, [facility]      Created: Fri Apr 07, 2013  9:52 AM       Electrolyte panel was negative ------

## 2013-04-18 ENCOUNTER — Telehealth: Payer: Self-pay | Admitting: Emergency Medicine

## 2013-04-18 ENCOUNTER — Ambulatory Visit (HOSPITAL_COMMUNITY)
Admission: RE | Admit: 2013-04-18 | Discharge: 2013-04-18 | Disposition: A | Discharge status: Home / Self Care | Payer: No Typology Code available for payment source | Source: Ambulatory Visit | Attending: Internal Medicine | Admitting: Internal Medicine

## 2013-04-18 DIAGNOSIS — M5137 Other intervertebral disc degeneration, lumbosacral region: Secondary | ICD-10-CM | POA: Insufficient documentation

## 2013-04-18 DIAGNOSIS — M545 Low back pain, unspecified: Secondary | ICD-10-CM

## 2013-04-18 DIAGNOSIS — M51379 Other intervertebral disc degeneration, lumbosacral region without mention of lumbar back pain or lower extremity pain: Secondary | ICD-10-CM | POA: Insufficient documentation

## 2013-04-18 NOTE — Telephone Encounter (Signed)
Message copied by Darlis LoanSMITH, Latanga Nedrow D on Tue Apr 18, 2013  3:10 PM ------      Message from: Susie CassetteABROL MD, [facility]      Created: Tue Apr 18, 2013  1:24 PM       Notify patient of the MRI of the spine shows wear and tear arthritis most prominent at L4-L5. No significant surgical findings, no significant bulging disc ------

## 2013-04-18 NOTE — Telephone Encounter (Signed)
Left message for pt to call clinic when message received 

## 2013-04-20 ENCOUNTER — Telehealth: Payer: Self-pay | Admitting: Emergency Medicine

## 2013-04-20 NOTE — Telephone Encounter (Signed)
Message copied by Darlis LoanSMITH, JILL D on Thu Apr 20, 2013  4:28 PM ------      Message from: Susie CassetteABROL MD, [facility]      Created: Tue Apr 18, 2013  1:24 PM       Notify patient of the MRI of the spine shows wear and tear arthritis most prominent at L4-L5. No significant surgical findings, no significant bulging disc ------

## 2013-04-20 NOTE — Telephone Encounter (Signed)
Pt given imaging results

## 2013-05-02 ENCOUNTER — Ambulatory Visit
Admission: RE | Admit: 2013-05-02 | Discharge: 2013-05-02 | Disposition: A | Discharge status: Home / Self Care | Payer: No Typology Code available for payment source | Source: Ambulatory Visit | Attending: Family Medicine | Admitting: Family Medicine

## 2013-05-02 ENCOUNTER — Encounter: Payer: Self-pay | Admitting: Family Medicine

## 2013-05-02 ENCOUNTER — Ambulatory Visit (INDEPENDENT_AMBULATORY_CARE_PROVIDER_SITE_OTHER): Payer: No Typology Code available for payment source | Admitting: Family Medicine

## 2013-05-02 VITALS — BP 123/78 | Ht 66.0 in | Wt 170.0 lb

## 2013-05-02 DIAGNOSIS — M23302 Other meniscus derangements, unspecified lateral meniscus, unspecified knee: Secondary | ICD-10-CM

## 2013-05-02 DIAGNOSIS — M6751 Plica syndrome, right knee: Secondary | ICD-10-CM

## 2013-05-02 DIAGNOSIS — M23307 Other meniscus derangements, unspecified meniscus, left knee: Secondary | ICD-10-CM

## 2013-05-02 DIAGNOSIS — M6750 Plica syndrome, unspecified knee: Secondary | ICD-10-CM

## 2013-05-02 MED ORDER — MELOXICAM 15 MG PO TABS
15.0000 mg | ORAL_TABLET | Freq: Every day | ORAL | Status: DC
Start: 2013-05-02 — End: 2013-05-30

## 2013-05-02 MED ORDER — METHYLPREDNISOLONE ACETATE 40 MG/ML IJ SUSP
40.0000 mg | Freq: Once | INTRAMUSCULAR | Status: AC
Start: 2013-05-02 — End: 2013-05-02
  Administered 2013-05-02: 40 mg via INTRA_ARTICULAR

## 2013-05-02 NOTE — Patient Instructions (Signed)
Thank you for coming in today  1. Inject left knee 2. Inject tissue band right knee 3. Physical therapy 4. Knee xrays 5. Meloxicam 15 mg daily with breakfast  Followup 1 month

## 2013-05-02 NOTE — Progress Notes (Signed)
CC: Bilateral knee pain HPI: Patient presents with bilateral knee pain. She states that she had a left knee arthroscopy a [facility] in 2013. She does not recall what this is for. However she states that she is having night pain as well as throbbing and aching. It bothers her to walk. She also notes pain and popping that is painful. It does swell occasionally. With regards to her right knee she is noting a popping sensation around her kneecap that is associated with pain. No swelling. She has not really tried anything for this because she is afraid of taking any medications. She wasn't sure what else to do.  ROS: As above in the HPI. All other systems are stable or negative.  OBJECTIVE: APPEARANCE:  Patient in no acute distress.The patient appeared well nourished and normally developed. HEENT: No scleral icterus. Conjunctiva non-injected Resp: Non labored Skin: No rash MSK:  Left Knee - Inspection normal with no erythema or effusion or obvious bony abnormalities.  - Palpation with mild joint line tenderness. No TTP at patellar or quad tendon.  - ROM normal in flexion and extension. - Strength 5/5 in flexion and extension. - Ligaments with solid consistent endpoints including ACL, PCL, LCL, MCL.  - Severe pain and pop with Mcmurray's.  - Non painful patellar compression.  - Neurovascularly intact Right Knee - Inspection normal with no erythema or effusion or obvious bony abnormalities.  - With flexion and extension passively, I can palpate the lateral plica popping over the patellar facet which recreates the patient's pain - Palpation normal with no warmth or joint line tenderness. No TTP at patellar or quad tendon.  - ROM normal in flexion and extension. - Strength 5/5 in flexion and extension. - Ligaments with solid consistent endpoints including ACL, PCL, LCL, MCL.  - Mild pain with Mcmurray's.  - Non painful patellar compression.  - Neurovascularly intact   MSK US:  Not performed   ASSESSMENT: #1. Persistent left knee pain status post arthroscopy in 2013 with exam most consistent with DJD versus degenerative meniscal tear #2. 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 For her left knee, I would recommend trial of corticosteroid injection as well as physical therapy. We will start her on meloxicam daily. She was asked to take this with food. #2. 4 right knee, we will inject the symptomatic plica. Risks and benefits were discussed including risk of loss of skin pigment and skin atrophy.  Consent obtained and verified.  Time-out conducted.  Noted no overlying erythema, induration, or other signs of local infection.  Skin prepped in a sterile fashion.  Topical analgesic spray: Ethyl chloride.  Joint: Left knee Needle: 22-gauge 1-1/2 inch Completed without difficulty.  Meds: 4 cc 1% lidocaine, 1 cc depomedrol Advised to call if fevers/chills, erythema, induration, drainage, or persistent bleeding.  Consent obtained and verified.  Time-out conducted.  Noted no overlying erythema, induration, or other signs of local infection.  Skin prepped in a sterile fashion.  Topical analgesic spray: Ethyl chloride.  Joint: Right lateral knee at lateral plica Needle: 25-gauge 5/8 inch Completed without difficulty.  Meds: 1 cc 1% lidocaine, 1 cc depomedrol Advised to call if fevers/chills, erythema, induration, drainage, or persistent bleeding.

## 2013-05-15 ENCOUNTER — Ambulatory Visit: Payer: No Typology Code available for payment source | Attending: Internal Medicine | Admitting: Internal Medicine

## 2013-05-15 ENCOUNTER — Encounter: Payer: Self-pay | Admitting: Internal Medicine

## 2013-05-15 VITALS — BP 126/89 | HR 70 | Temp 98.5°F | Resp 16 | Ht 66.0 in | Wt 179.0 lb

## 2013-05-15 DIAGNOSIS — M109 Gout, unspecified: Secondary | ICD-10-CM | POA: Insufficient documentation

## 2013-05-15 DIAGNOSIS — E119 Type 2 diabetes mellitus without complications: Secondary | ICD-10-CM | POA: Insufficient documentation

## 2013-05-15 DIAGNOSIS — I10 Essential (primary) hypertension: Secondary | ICD-10-CM | POA: Insufficient documentation

## 2013-05-15 DIAGNOSIS — K219 Gastro-esophageal reflux disease without esophagitis: Secondary | ICD-10-CM | POA: Insufficient documentation

## 2013-05-15 DIAGNOSIS — M545 Low back pain, unspecified: Secondary | ICD-10-CM | POA: Insufficient documentation

## 2013-05-15 DIAGNOSIS — Z0000 Encounter for general adult medical examination without abnormal findings: Secondary | ICD-10-CM | POA: Insufficient documentation

## 2013-05-15 DIAGNOSIS — J45909 Unspecified asthma, uncomplicated: Secondary | ICD-10-CM | POA: Insufficient documentation

## 2013-05-15 LAB — COMPLETE METABOLIC PANEL WITH GFR
ALT: 13 U/L (ref 0–35)
AST: 18 U/L (ref 0–37)
Albumin: 4.2 g/dL (ref 3.5–5.2)
Alkaline Phosphatase: 117 U/L (ref 39–117)
BUN: 9 mg/dL (ref 6–23)
CO2: 26 mEq/L (ref 19–32)
Calcium: 9.6 mg/dL (ref 8.4–10.5)
Chloride: 100 mEq/L (ref 96–112)
Creat: 0.82 mg/dL (ref 0.50–1.10)
GFR, Est African American: >89 mL/min
GFR, Est Non African American: 82 mL/min
Glucose, Bld: 106 mg/dL — ABNORMAL HIGH (ref 70–99)
Potassium: 3.6 mEq/L (ref 3.5–5.3)
Sodium: 137 mEq/L (ref 135–145)
Total Bilirubin: 0.5 mg/dL (ref 0.2–1.2)
Total Protein: 6.9 g/dL (ref 6.0–8.3)

## 2013-05-15 LAB — GLUCOSE, POCT (MANUAL RESULT ENTRY): POC Glucose: 107 mg/dl — AB (ref 70–99)

## 2013-05-15 LAB — LIPID PANEL
Cholesterol: 236 mg/dL — ABNORMAL HIGH (ref 0–200)
HDL: 48 mg/dL (ref >39)
LDL Cholesterol: 155 mg/dL — ABNORMAL HIGH (ref 0–99)
Total CHOL/HDL Ratio: 4.9 Ratio
Triglycerides: 165 mg/dL — ABNORMAL HIGH (ref <150)
VLDL: 33 mg/dL (ref 0–40)

## 2013-05-15 LAB — POCT GLYCOSYLATED HEMOGLOBIN (HGB A1C): Hemoglobin A1C: 6.6

## 2013-05-15 MED ORDER — GABAPENTIN 100 MG PO CAPS: 100.0000 mg | ORAL_CAPSULE | Freq: Three times a day (TID) | ORAL | Status: DC

## 2013-05-15 MED ORDER — TRAMADOL HCL 50 MG PO TABS: 50.0000 mg | ORAL_TABLET | Freq: Three times a day (TID) | ORAL | Status: DC | PRN

## 2013-05-15 MED ORDER — VALSARTAN-HYDROCHLOROTHIAZIDE 160-12.5 MG PO TABS: 1.0000 | ORAL_TABLET | Freq: Every day | ORAL | Status: DC

## 2013-05-15 MED ORDER — METOCLOPRAMIDE HCL 5 MG PO TABS: 5.0000 mg | ORAL_TABLET | Freq: Three times a day (TID) | ORAL | Status: DC | PRN

## 2013-05-15 MED ORDER — PANTOPRAZOLE SODIUM 40 MG PO TBEC: 40.0000 mg | DELAYED_RELEASE_TABLET | Freq: Every day | ORAL | Status: DC

## 2013-05-15 MED ORDER — BUDESONIDE-FORMOTEROL FUMARATE 80-4.5 MCG/ACT IN AERO: 2.0000 | INHALATION_SPRAY | Freq: Two times a day (BID) | RESPIRATORY_TRACT | Status: DC

## 2013-05-15 MED ORDER — METFORMIN HCL ER 750 MG PO TB24: 750.0000 mg | ORAL_TABLET | Freq: Every day | ORAL | Status: DC

## 2013-05-15 MED ORDER — FREESTYLE LANCETS MISC: Status: DC

## 2013-05-15 MED ORDER — COLCHICINE 0.6 MG PO TABS: 0.6000 mg | ORAL_TABLET | Freq: Every day | ORAL | Status: DC

## 2013-05-15 MED ORDER — CYCLOBENZAPRINE HCL 10 MG PO TABS: 10.0000 mg | ORAL_TABLET | Freq: Every day | ORAL | Status: DC

## 2013-05-15 MED ORDER — ALBUTEROL SULFATE (2.5 MG/3ML) 0.083% IN NEBU: 2.5000 mg | INHALATION_SOLUTION | RESPIRATORY_TRACT | Status: DC | PRN

## 2013-05-15 NOTE — Patient Instructions (Signed)
DASH Diet The DASH diet stands for "Dietary Approaches to Stop Hypertension." It is a healthy eating plan that has been shown to reduce high blood pressure (hypertension) in as little as 14 days, while also possibly providing other significant health benefits. These other health benefits include reducing the risk of breast cancer after menopause and reducing the risk of type 2 diabetes, heart disease, colon cancer, and stroke. Health benefits also include weight loss and slowing kidney failure in patients with chronic kidney disease.  DIET GUIDELINES  Limit salt (sodium). Your diet should contain less than 1500 mg of sodium daily.  Limit refined or processed carbohydrates. Your diet should include mostly whole grains. Desserts and added sugars should be used sparingly.  Include small amounts of heart-healthy fats. These types of fats include nuts, oils, and tub margarine. Limit saturated and trans fats. These fats have been shown to be harmful in the body. CHOOSING FOODS  The following food groups are based on a 2000 calorie diet. See your Registered Dietitian for individual calorie needs. Grains and Grain Products (6 to 8 servings daily)  Eat More Often: Whole-wheat bread, brown rice, whole-grain or wheat pasta, quinoa, popcorn without added fat or salt (air popped).  Eat Less Often: White bread, white pasta, white rice, cornbread. Vegetables (4 to 5 servings daily)  Eat More Often: Fresh, frozen, and canned vegetables. Vegetables may be raw, steamed, roasted, or grilled with a minimal amount of fat.  Eat Less Often/Avoid: Creamed or fried vegetables. Vegetables in a cheese sauce. Fruit (4 to 5 servings daily)  Eat More Often: All fresh, canned (in natural juice), or frozen fruits. Dried fruits without added sugar. One hundred percent fruit juice ( cup [237 mL] daily).  Eat Less Often: Dried fruits with added sugar. Canned fruit in light or heavy syrup. Lean Meats, Fish, and Poultry (2  servings or less daily. One serving is 3 to 4 oz [85-114 g]).  Eat More Often: Ninety percent or leaner ground beef, tenderloin, sirloin. Round cuts of beef, chicken breast, turkey breast. All fish. Grill, bake, or broil your meat. Nothing should be fried.  Eat Less Often/Avoid: Fatty cuts of meat, turkey, or chicken leg, thigh, or wing. Fried cuts of meat or fish. Dairy (2 to 3 servings)  Eat More Often: Low-fat or fat-free milk, low-fat plain or light yogurt, reduced-fat or part-skim cheese.  Eat Less Often/Avoid: Milk (whole, 2%).Whole milk yogurt. Full-fat cheeses. Nuts, Seeds, and Legumes (4 to 5 servings per week)  Eat More Often: All without added salt.  Eat Less Often/Avoid: Salted nuts and seeds, canned beans with added salt. Fats and Sweets (limited)  Eat More Often: Vegetable oils, tub margarines without trans fats, sugar-free gelatin. Mayonnaise and salad dressings.  Eat Less Often/Avoid: Coconut oils, palm oils, butter, stick margarine, cream, half and half, cookies, candy, pie. FOR MORE INFORMATION The Dash Diet Eating Plan: www.dashdiet.org Document Released: 01/08/2011 Document Revised: 04/13/2011 Document Reviewed: 01/08/2011 Exit[employer] Patient Information 2014 [employer]. Hypertension As your heart beats, it forces blood through your arteries. This force is your blood pressure. If the pressure is too high, it is called hypertension (HTN) or high blood pressure. HTN is dangerous because you may have it and not know it. High blood pressure may mean that your heart has to work harder to pump blood. Your arteries may be narrow or stiff. The extra work puts you at risk for heart disease, stroke, and other problems.  Blood pressure consists of two numbers, a   higher number over a lower, 110/72, for example. It is stated as "110 over 72." The ideal is below 120 for the top number (systolic) and under 80 for the bottom (diastolic). Write down your blood pressure today. You  should pay close attention to your blood pressure if you have certain conditions such as:  Heart failure.  Prior heart attack.  Diabetes  Chronic kidney disease.  Prior stroke.  Multiple risk factors for heart disease. To see if you have HTN, your blood pressure should be measured while you are seated with your arm held at the level of the heart. It should be measured at least twice. A one-time elevated blood pressure reading (especially in the Emergency Department) does not mean that you need treatment. There may be conditions in which the blood pressure is different between your right and left arms. It is important to see your caregiver soon for a recheck. Most people have essential hypertension which means that there is not a specific cause. This type of high blood pressure may be lowered by changing lifestyle factors such as:  Stress.  Smoking.  Lack of exercise.  Excessive weight.  Drug/tobacco/alcohol use.  Eating less salt. Most people do not have symptoms from high blood pressure until it has caused damage to the body. Effective treatment can often prevent, delay or reduce that damage. TREATMENT  When a cause has been identified, treatment for high blood pressure is directed at the cause. There are a large number of medications to treat HTN. These fall into several categories, and your caregiver will help you select the medicines that are best for you. Medications may have side effects. You should review side effects with your caregiver. If your blood pressure stays high after you have made lifestyle changes or started on medicines,   Your medication(s) may need to be changed.  Other problems may need to be addressed.  Be certain you understand your prescriptions, and know how and when to take your medicine.  Be sure to follow up with your caregiver within the time frame advised (usually within two weeks) to have your blood pressure rechecked and to review your  medications.  If you are taking more than one medicine to lower your blood pressure, make sure you know how and at what times they should be taken. Taking two medicines at the same time can result in blood pressure that is too low. SEEK IMMEDIATE MEDICAL CARE IF:  You develop a severe headache, blurred or changing vision, or confusion.  You have unusual weakness or numbness, or a faint feeling.  You have severe chest or abdominal pain, vomiting, or breathing problems. MAKE SURE YOU:   Understand these instructions.  Will watch your condition.  Will get help right away if you are not doing well or get worse. Document Released: 01/19/2005 Document Revised: 04/13/2011 Document Reviewed: 09/09/2007 Exit[employer] Patient Information 2014 [employer].  

## 2013-05-15 NOTE — Progress Notes (Signed)
Patient ID: Stacy [name], female   DOB: 1960-07-27, 53 y.o.   MRN: [id number]   Pluma [name], is a 53 y.o. female  OTL:[id number]  HRC:[id number]  DOB - 22-Jan-1961  Chief Complaint  Patient presents with  . Follow-up        Subjective:   Stacy [name] is a 53 y.o. female here today for a follow up visit. Patient has history of hypertension, diabetes, GERD, COPD, IBS and osteoarthritis, here today for annual physical examination. She would like to have mammogram done as well as colonoscopy. She also requested a refill of all her medications. She has no complaint today. Her medications are listed below, patient is compliant with her medications. She quit smoking 2002, she does not drink alcohol. Patient has No headache, No chest pain, No abdominal pain - No Nausea, No new weakness tingling or numbness, No Cough - SOB.  Problem  Low Back Pain  Preventative Health Care    ALLERGIES: Allergies  Allergen Reactions  . Other Shortness Of Breath    Allergic to perfumes and cleaning products  . Aspirin Nausea Only    unknown  . Ace Inhibitors     Cough, wheeze    PAST MEDICAL HISTORY: Past Medical History  Diagnosis Date  . Hypertension     Pt on lisinopril  . GERD (gastroesophageal reflux disease)     Pt on Protonix daily  . Arthritis     Takes Naprosyn   . Diabetes mellitus     Takes Metformin 750 mg  . Hyperlipidemia   . Asthma   . COPD (chronic obstructive pulmonary disease)   . Shortness of breath     occasional - uses breathing tx at home  . Headache(784.0)     otc meds prn  . Irritable bowel syndrome 11/19/2010    MEDICATIONS AT HOME: Prior to Admission medications   Medication Sig Start Date End Date Taking? Authorizing Provider  albuterol (PROVENTIL) (2.5 MG/3ML) 0.083% nebulizer solution Take 3 mLs (2.5 mg total) by nebulization every 4 (four) hours as needed for wheezing. 05/15/13  Yes Angelica Chessman, MD  colchicine 0.6 MG tablet Take 1 tablet (0.6 mg  total) by mouth daily. 05/15/13  Yes Angelica Chessman, MD  cyclobenzaprine (FLEXERIL) 10 MG tablet Take 1 tablet (10 mg total) by mouth at bedtime. 05/15/13  Yes Angelica Chessman, MD  EPINEPHrine (EPI-PEN) 0.3 mg/0.3 mL DEVI Inject 0.3 mg into the muscle once.   Yes Historical Provider, MD  gabapentin (NEURONTIN) 100 MG capsule Take 1 capsule (100 mg total) by mouth 3 (three) times daily. 05/15/13  Yes Angelica Chessman, MD  glucose monitoring kit (FREESTYLE) monitoring kit 1 each by Does not apply route as needed for other. 02/16/13  Yes Lorayne Marek, MD  hydrOXYzine (ATARAX/VISTARIL) 10 MG tablet Take 1 tablet (10 mg total) by mouth 3 (three) times daily as needed. 02/16/13  Yes Lorayne Marek, MD  Lancets (FREESTYLE) lancets Use as instructed 05/15/13  Yes Angelica Chessman, MD  meloxicam (MOBIC) 15 MG tablet Take 1 tablet (15 mg total) by mouth daily. 05/02/13  Yes Duane Boston, MD  metFORMIN (GLUCOPHAGE-XR) 750 MG 24 hr tablet Take 1 tablet (750 mg total) by mouth daily with breakfast. 05/15/13  Yes Angelica Chessman, MD  metoCLOPramide (REGLAN) 5 MG tablet Take 1 tablet (5 mg total) by mouth every 8 (eight) hours as needed for nausea or vomiting. 05/15/13  Yes Angelica Chessman, MD  ondansetron (ZOFRAN) 4 MG tablet Take 1 tablet (4 mg total) by  mouth every 6 (six) hours as needed for nausea or vomiting. 01/14/13  Yes Ephraim Hamburger, MD  pantoprazole (PROTONIX) 40 MG tablet Take 1 tablet (40 mg total) by mouth daily. 05/15/13  Yes Angelica Chessman, MD  Tuberculin-Allergy Syringes (ALLERGY SYRINGE 1CC/28GX1/2") 28G X 1/2" 1 ML MISC As directed by allergist 02/16/13  Yes Lorayne Marek, MD  valsartan-hydrochlorothiazide (DIOVAN-HCT) 160-12.5 MG per tablet Take 1 tablet by mouth daily. 05/15/13  Yes Angelica Chessman, MD  Vitamin D, Ergocalciferol, (DRISDOL) 50000 UNITS CAPS capsule Take 1 capsule (50,000 Units total) by mouth every 7 (seven) days. 02/17/13  Yes Lorayne Marek, MD  budesonide-formoterol  (SYMBICORT) 80-4.5 MCG/ACT inhaler Inhale 2 puffs into the lungs 2 (two) times daily. 05/15/13 05/15/14  Angelica Chessman, MD  famotidine (PEPCID) 20 MG tablet One at bedtime 05/25/11 07/28/12  Tanda Rockers, MD     Objective:   Filed Vitals:   05/15/13 1112  BP: 126/89  Pulse: 70  Temp: 98.5 F (36.9 C)  TempSrc: Oral  Resp: 16  Height: 5' 6"  (1.676 m)  Weight: 179 lb (81.194 kg)  SpO2: 96%    Exam General appearance : Awake, alert, not in any distress. Speech Clear. Not toxic looking HEENT: Atraumatic and Normocephalic, pupils equally reactive to light and accomodation Neck: supple, no JVD. No cervical lymphadenopathy.  Chest:Good air entry bilaterally, no added sounds  CVS: S1 S2 regular, no murmurs.  Abdomen: Bowel sounds present, Non tender and not distended with no gaurding, rigidity or rebound. Extremities: B/L Lower Ext shows no edema, both legs are warm to touch Neurology: Awake alert, and oriented X 3, CN II-XII intact, Non focal Skin:No Rash Wounds:N/A  Data Review Lab Results  Component Value Date   HGBA1C 6.4 02/16/2013   HGBA1C  Value: 6.9 (NOTE)                                                                       According to the ADA Clinical Practice Recommendations for 2011, when HbA1c is used as a screening test:   >=6.5%   Diagnostic of Diabetes Mellitus           (if abnormal result  is confirmed)  5.7-6.4%   Increased risk of developing Diabetes Mellitus  References:Diagnosis and Classification of Diabetes Mellitus,Diabetes QMGQ,6761,95(KDTOI 1):S62-S69 and Standards of Medical Care in         Diabetes - 2011,Diabetes ZTIW,5809,98  (Suppl 1):S11-S61.* 02/07/2010     Assessment & Plan   1. Low back pain Refill - cyclobenzaprine (FLEXERIL) 10 MG tablet; Take 1 tablet (10 mg total) by mouth at bedtime.  Dispense: 30 tablet; Refill: 3 - gabapentin (NEURONTIN) 100 MG capsule; Take 1 capsule (100 mg total) by mouth 3 (three) times daily.  Dispense: 270 capsule;  Refill: 3  2. DM (diabetes mellitus) Refill - Lancets (FREESTYLE) lancets; Use as instructed  Dispense: 100 each; Refill: 12 - metFORMIN (GLUCOPHAGE-XR) 750 MG 24 hr tablet; Take 1 tablet (750 mg total) by mouth daily with breakfast.  Dispense: 90 tablet; Refill: 3  3. GERD (gastroesophageal reflux disease) Refill - metoCLOPramide (REGLAN) 5 MG tablet; Take 1 tablet (5 mg total) by mouth every 8 (eight) hours as needed for nausea or vomiting.  Dispense: 90  tablet; Refill: 0 - pantoprazole (PROTONIX) 40 MG tablet; Take 1 tablet (40 mg total) by mouth daily.  Dispense: 90 tablet; Refill: 3  4. Preventative health care Labs today and health screening  - MM Digital Screening; Future - HM COLONOSCOPY for colon cancer screening - COMPLETE METABOLIC PANEL WITH GFR - POCT A1C - Lipid panel - DG Bone Density; Future - Ambulatory referral to Gastroenterology for screening colonoscopy  5. Asthma, chronic Refill - albuterol (PROVENTIL) (2.5 MG/3ML) 0.083% nebulizer solution; Take 3 mLs (2.5 mg total) by nebulization every 4 (four) hours as needed for wheezing.  Dispense: 75 mL; Refill: 1 - budesonide-formoterol (SYMBICORT) 80-4.5 MCG/ACT inhaler; Inhale 2 puffs into the lungs 2 (two) times daily.  Dispense: 3 Inhaler; Refill: 3  6. Hypertension Refill - valsartan-hydrochlorothiazide (DIOVAN-HCT) 160-12.5 MG per tablet; Take 1 tablet by mouth daily.  Dispense: 90 tablet; Refill: 3  7. Gout Refill - colchicine 0.6 MG tablet; Take 1 tablet (0.6 mg total) by mouth daily.  Dispense: 30 tablet; Refill: 3  Patient was extensively counseled on nutrition and exercise  Return in about 3 months (around 08/14/2013), or if symptoms worsen or fail to improve, for Pap Smear.  The patient was given clear instructions to go to ER or return to medical center if symptoms don't improve, worsen or new problems develop. The patient verbalized understanding. The patient was told to call to get lab results if  they haven't heard anything in the next week.   This note has been created with Surveyor, quantity. Any transcriptional errors are unintentional.    Angelica Chessman, MD, [place], [place], [place] and [facility] [place], [place]   05/15/2013, 12:05 PM

## 2013-05-15 NOTE — Progress Notes (Signed)
Pt is here following up on her diabetes. Pt is wanting a MM and a referral to a foot doctor.

## 2013-05-21 IMAGING — CR DG HIP W/ PELVIS BILAT
5 series · 5 of 5 positions shown · non-contrast
Comparison: None.

CLINICAL DATA: Fall

BILATERAL HIP WITH PELVIS - 4+ VIEW

[t pelvis ap]
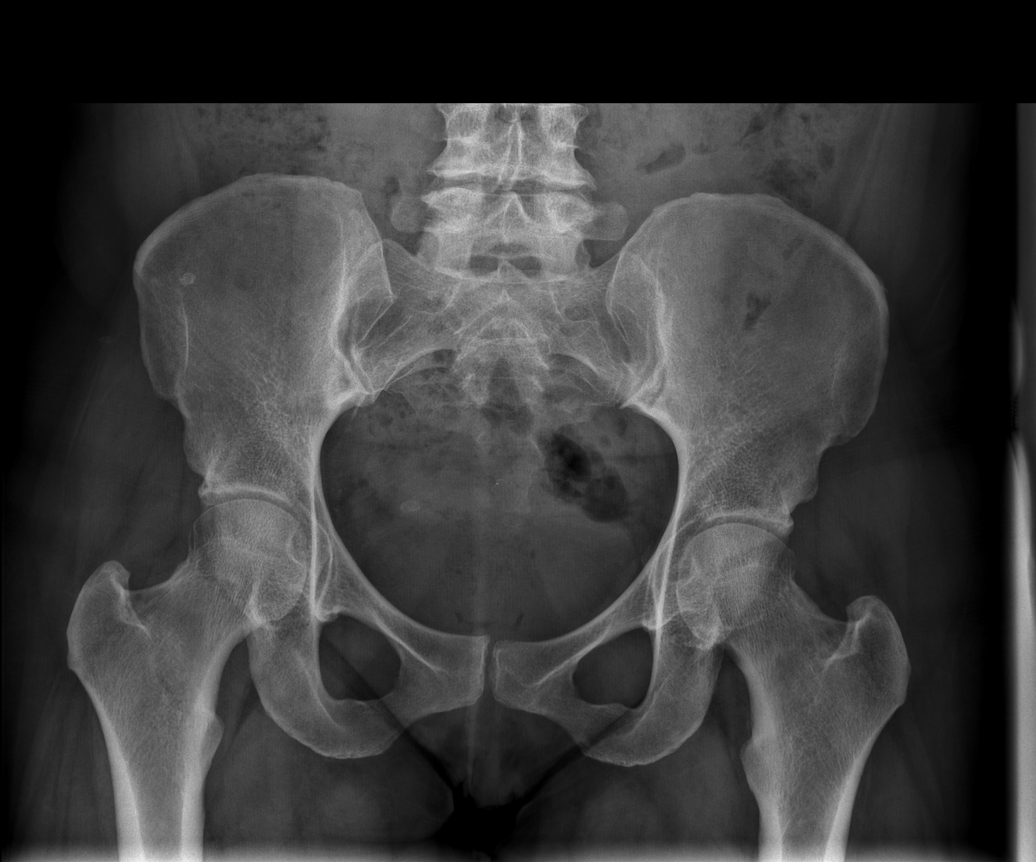

[t hip ap left]
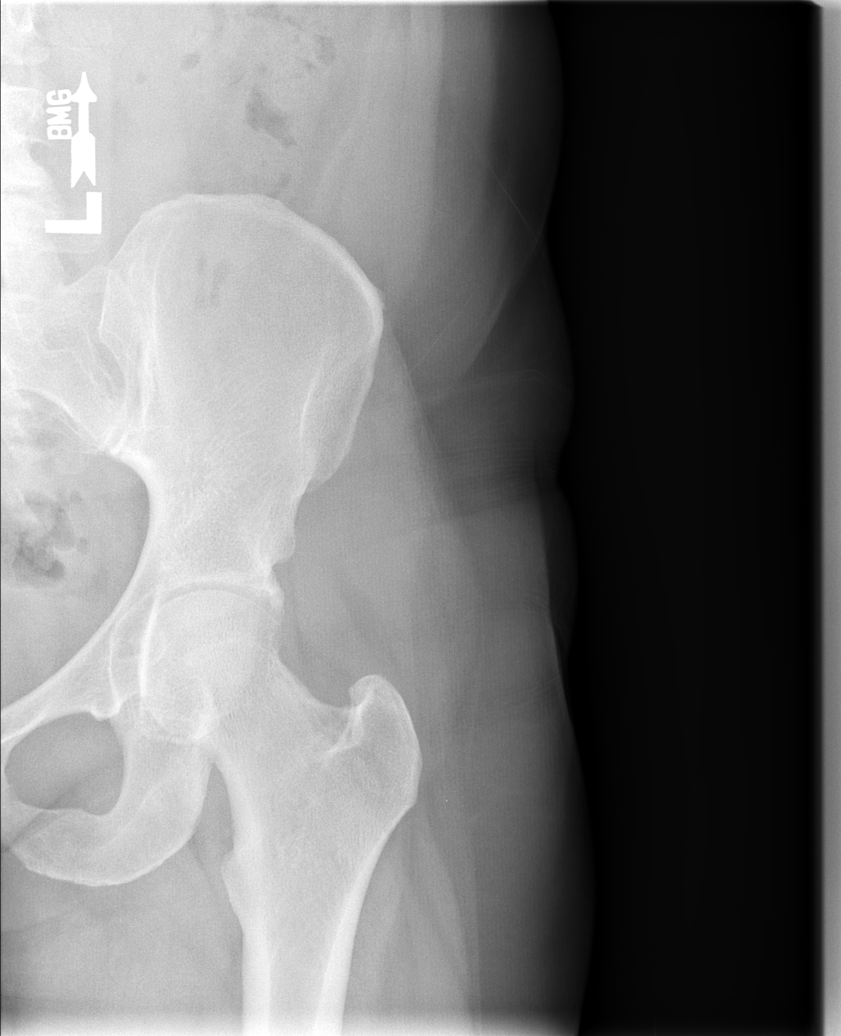

[t hip ap right]
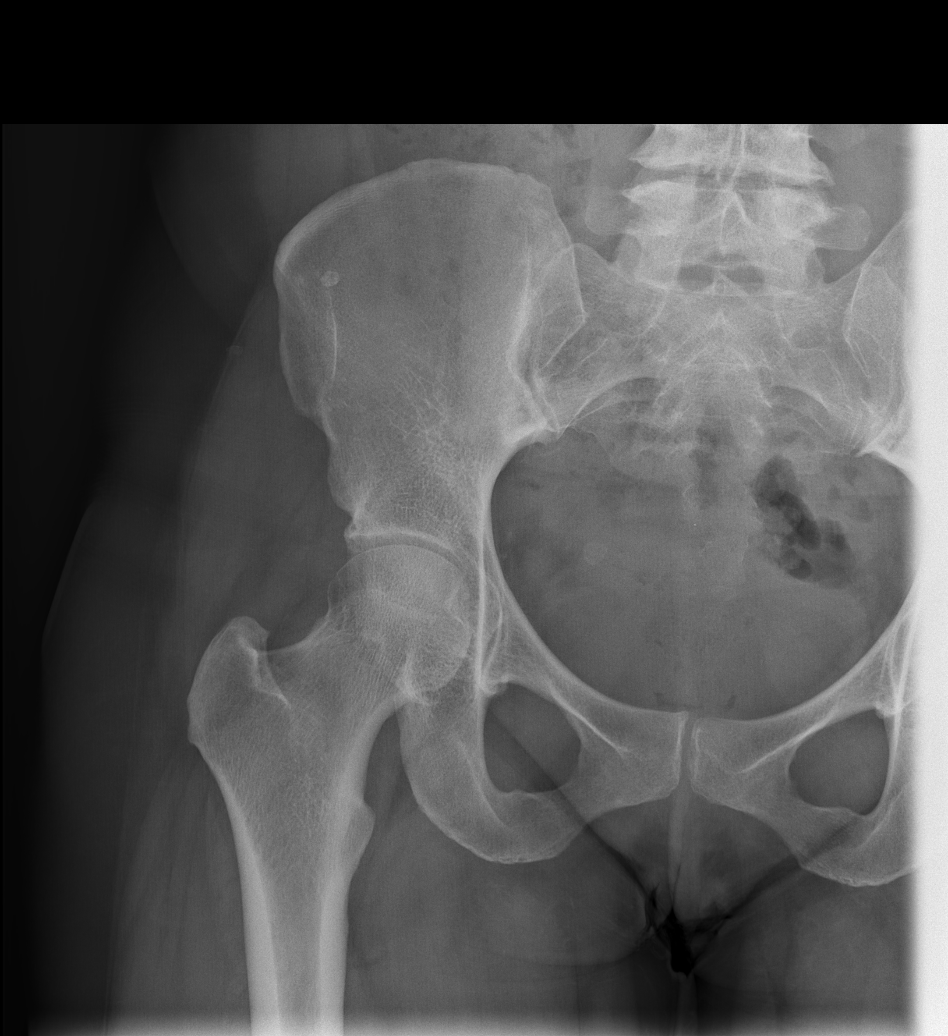

[t hip frog leg right]
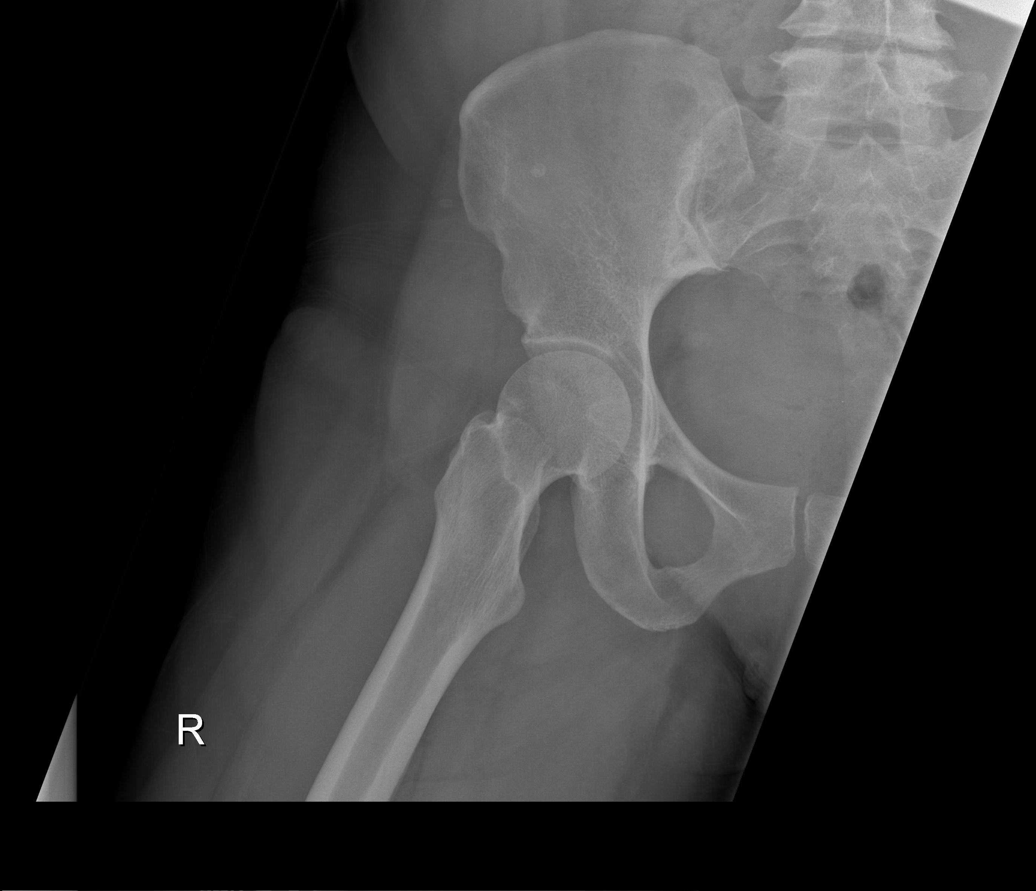

[t hip frog leg left]
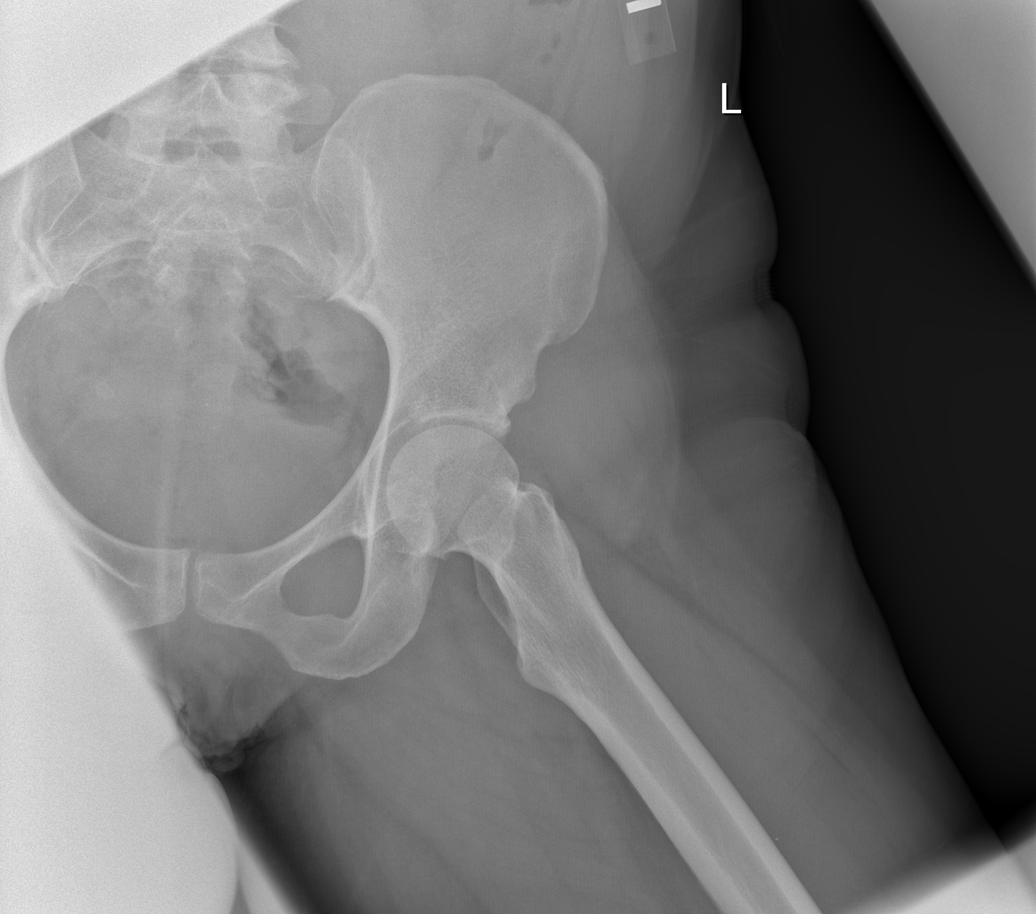

[5 of 5 positions shown; findings below may reference images not displayed]

FINDINGS: No fracture or dislocation is seen.

Bilateral hip joint spaces are symmetric.

Mild degenerative changes of the lower lumbar spine.
IMPRESSION: Normal hip radiographs.

## 2013-05-29 ENCOUNTER — Ambulatory Visit: Payer: No Typology Code available for payment source | Attending: Family Medicine | Admitting: Physical Therapy

## 2013-05-29 DIAGNOSIS — IMO0001 Reserved for inherently not codable concepts without codable children: Secondary | ICD-10-CM | POA: Insufficient documentation

## 2013-05-29 DIAGNOSIS — R262 Difficulty in walking, not elsewhere classified: Secondary | ICD-10-CM | POA: Insufficient documentation

## 2013-05-29 DIAGNOSIS — M25569 Pain in unspecified knee: Secondary | ICD-10-CM | POA: Insufficient documentation

## 2013-05-29 DIAGNOSIS — R5381 Other malaise: Secondary | ICD-10-CM | POA: Insufficient documentation

## 2013-05-29 DIAGNOSIS — M6281 Muscle weakness (generalized): Secondary | ICD-10-CM | POA: Insufficient documentation

## 2013-05-30 ENCOUNTER — Ambulatory Visit (INDEPENDENT_AMBULATORY_CARE_PROVIDER_SITE_OTHER): Payer: No Typology Code available for payment source | Admitting: Family Medicine

## 2013-05-30 ENCOUNTER — Encounter: Payer: Self-pay | Admitting: Family Medicine

## 2013-05-30 VITALS — BP 118/80 | HR 83 | Ht 66.0 in | Wt 179.0 lb

## 2013-05-30 DIAGNOSIS — M25561 Pain in right knee: Secondary | ICD-10-CM

## 2013-05-30 DIAGNOSIS — M25562 Pain in left knee: Principal | ICD-10-CM

## 2013-05-30 DIAGNOSIS — M25569 Pain in unspecified knee: Secondary | ICD-10-CM

## 2013-05-30 MED ORDER — MELOXICAM 15 MG PO TABS: 15.0000 mg | ORAL_TABLET | Freq: Every day | ORAL | Status: DC

## 2013-05-30 NOTE — Patient Instructions (Signed)
Thank you for coming in today  1. Do at least one more PT session to learn exercises to do at home 2. Refill meloxicam. Take 1 tablet daily 3. Ice and heat are great 4. Try tramadol as needed  Followup with me as needed

## 2013-05-30 NOTE — Progress Notes (Signed)
CC: Followup bilateral knee pain HPI: Patient returns for followup of bilateral knee pain. Last time she received a corticosteroid injection into her left knee and plica injection to the right knee. She states that these helped some. She has been going to physical therapy but states that this is too expensive for her. She is still taking the meloxicam which she finds somewhat helpful. She has also been prescribed tramadol by her PCP which she has not yet started. Unfortunately her knees do continue to bother her and she is trying to apply for disability.  ROS: As above in the HPI. All other systems are stable or negative.  OBJECTIVE: APPEARANCE:  Patient in no acute distress.The patient appeared well nourished and normally developed. HEENT: No scleral icterus. Conjunctiva non-injected Resp: Non labored Skin: No rash MSK:  Bilateral knees: No swelling or deformity. No joint line tenderness to palpation. No effusion.   MSK US:  Not performed   ASSESSMENT: #1. Persistent left knee pain status post arthroscopy in 2013, improving some after injection  #2. 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 However I understand that this may be cost prohibitive and I have asked her to at least have one session to learn a home exercise plan that she may do on her own. She was encouraged to continue the meloxicam and this was refilled today. She may try the tramadol it was prescribed by her PCP as well. I'm glad to see that she is somewhat better with the injections. We will see her back on an as-needed basis.

## 2013-05-31 ENCOUNTER — Encounter: Payer: No Typology Code available for payment source | Admitting: Rehabilitation

## 2013-06-01 ENCOUNTER — Other Ambulatory Visit: Payer: Self-pay | Admitting: Internal Medicine

## 2013-06-01 ENCOUNTER — Ambulatory Visit (HOSPITAL_COMMUNITY)
Admission: RE | Admit: 2013-06-01 | Discharge: 2013-06-01 | Disposition: A | Discharge status: Home / Self Care | Payer: No Typology Code available for payment source | Source: Ambulatory Visit | Attending: Internal Medicine | Admitting: Internal Medicine

## 2013-06-01 ENCOUNTER — Ambulatory Visit: Payer: No Typology Code available for payment source | Attending: Internal Medicine

## 2013-06-01 DIAGNOSIS — Z1231 Encounter for screening mammogram for malignant neoplasm of breast: Secondary | ICD-10-CM

## 2013-06-01 DIAGNOSIS — Z0000 Encounter for general adult medical examination without abnormal findings: Secondary | ICD-10-CM

## 2013-06-01 DIAGNOSIS — Z780 Asymptomatic menopausal state: Secondary | ICD-10-CM | POA: Insufficient documentation

## 2013-06-01 DIAGNOSIS — Z13820 Encounter for screening for osteoporosis: Secondary | ICD-10-CM | POA: Insufficient documentation

## 2013-06-05 ENCOUNTER — Encounter: Payer: No Typology Code available for payment source | Admitting: Rehabilitation

## 2013-06-05 ENCOUNTER — Other Ambulatory Visit: Payer: Self-pay | Admitting: Internal Medicine

## 2013-06-05 DIAGNOSIS — J45909 Unspecified asthma, uncomplicated: Secondary | ICD-10-CM

## 2013-06-05 DIAGNOSIS — M109 Gout, unspecified: Secondary | ICD-10-CM

## 2013-06-05 MED ORDER — BUDESONIDE-FORMOTEROL FUMARATE 80-4.5 MCG/ACT IN AERO: 2.0000 | INHALATION_SPRAY | Freq: Two times a day (BID) | RESPIRATORY_TRACT | Status: DC

## 2013-06-05 MED ORDER — COLCHICINE 0.6 MG PO TABS: 0.6000 mg | ORAL_TABLET | Freq: Every day | ORAL | Status: DC

## 2013-06-07 ENCOUNTER — Ambulatory Visit: Payer: No Typology Code available for payment source | Attending: Internal Medicine

## 2013-06-07 DIAGNOSIS — M6281 Muscle weakness (generalized): Secondary | ICD-10-CM | POA: Insufficient documentation

## 2013-06-07 DIAGNOSIS — R262 Difficulty in walking, not elsewhere classified: Secondary | ICD-10-CM | POA: Insufficient documentation

## 2013-06-07 DIAGNOSIS — Z5189 Encounter for other specified aftercare: Secondary | ICD-10-CM | POA: Insufficient documentation

## 2013-06-07 DIAGNOSIS — R5381 Other malaise: Secondary | ICD-10-CM | POA: Insufficient documentation

## 2013-06-07 DIAGNOSIS — M25569 Pain in unspecified knee: Secondary | ICD-10-CM | POA: Insufficient documentation

## 2013-06-08 ENCOUNTER — Telehealth: Payer: Self-pay | Admitting: Emergency Medicine

## 2013-06-08 NOTE — Telephone Encounter (Signed)
Message copied by Darlis LoanSMITH, JILL D on Thu Jun 08, 2013 12:39 PM ------      Message from: Jeanann LewandowskyJEGEDE, OLUGBEMIGA E      Created: Wed Jun 07, 2013  3:26 PM       Please inform patient that her bone mineral density result is normal. We encouraged her to continue over-the-counter vitamin D supplement as well as regular physical exercise as tolerated ------

## 2013-06-08 NOTE — Telephone Encounter (Signed)
Left message for pt to return call for test results.

## 2013-06-12 ENCOUNTER — Encounter: Payer: No Typology Code available for payment source | Admitting: Rehabilitation

## 2013-06-15 ENCOUNTER — Encounter: Payer: No Typology Code available for payment source | Admitting: Rehabilitation

## 2013-08-14 ENCOUNTER — Ambulatory Visit: Payer: No Typology Code available for payment source

## 2013-08-14 ENCOUNTER — Other Ambulatory Visit (HOSPITAL_COMMUNITY)
Admission: RE | Admit: 2013-08-14 | Discharge: 2013-08-14 | Disposition: A | Discharge status: Home / Self Care | Payer: No Typology Code available for payment source | Source: Ambulatory Visit | Attending: Internal Medicine | Admitting: Internal Medicine

## 2013-08-14 ENCOUNTER — Ambulatory Visit: Payer: No Typology Code available for payment source | Attending: Internal Medicine | Admitting: Internal Medicine

## 2013-08-14 ENCOUNTER — Encounter: Payer: Self-pay | Admitting: Internal Medicine

## 2013-08-14 VITALS — BP 124/84 | HR 67 | Temp 98.6°F | Resp 16 | Ht 66.0 in | Wt 183.0 lb

## 2013-08-14 DIAGNOSIS — R51 Headache: Secondary | ICD-10-CM | POA: Insufficient documentation

## 2013-08-14 DIAGNOSIS — Z113 Encounter for screening for infections with a predominantly sexual mode of transmission: Secondary | ICD-10-CM | POA: Insufficient documentation

## 2013-08-14 DIAGNOSIS — E119 Type 2 diabetes mellitus without complications: Secondary | ICD-10-CM

## 2013-08-14 DIAGNOSIS — Z01419 Encounter for gynecological examination (general) (routine) without abnormal findings: Secondary | ICD-10-CM | POA: Insufficient documentation

## 2013-08-14 DIAGNOSIS — Z9109 Other allergy status, other than to drugs and biological substances: Secondary | ICD-10-CM | POA: Insufficient documentation

## 2013-08-14 DIAGNOSIS — R0602 Shortness of breath: Secondary | ICD-10-CM | POA: Insufficient documentation

## 2013-08-14 DIAGNOSIS — J441 Chronic obstructive pulmonary disease with (acute) exacerbation: Secondary | ICD-10-CM

## 2013-08-14 DIAGNOSIS — Z888 Allergy status to other drugs, medicaments and biological substances status: Secondary | ICD-10-CM | POA: Insufficient documentation

## 2013-08-14 DIAGNOSIS — Z1151 Encounter for screening for human papillomavirus (HPV): Secondary | ICD-10-CM | POA: Insufficient documentation

## 2013-08-14 DIAGNOSIS — E785 Hyperlipidemia, unspecified: Secondary | ICD-10-CM | POA: Insufficient documentation

## 2013-08-14 DIAGNOSIS — J45901 Unspecified asthma with (acute) exacerbation: Secondary | ICD-10-CM

## 2013-08-14 DIAGNOSIS — M545 Low back pain, unspecified: Secondary | ICD-10-CM

## 2013-08-14 DIAGNOSIS — Z1211 Encounter for screening for malignant neoplasm of colon: Secondary | ICD-10-CM

## 2013-08-14 DIAGNOSIS — Z124 Encounter for screening for malignant neoplasm of cervix: Secondary | ICD-10-CM

## 2013-08-14 DIAGNOSIS — N760 Acute vaginitis: Secondary | ICD-10-CM | POA: Insufficient documentation

## 2013-08-14 DIAGNOSIS — I10 Essential (primary) hypertension: Secondary | ICD-10-CM

## 2013-08-14 DIAGNOSIS — K219 Gastro-esophageal reflux disease without esophagitis: Secondary | ICD-10-CM

## 2013-08-14 DIAGNOSIS — M129 Arthropathy, unspecified: Secondary | ICD-10-CM | POA: Insufficient documentation

## 2013-08-14 DIAGNOSIS — K589 Irritable bowel syndrome without diarrhea: Secondary | ICD-10-CM | POA: Insufficient documentation

## 2013-08-14 LAB — POCT GLYCOSYLATED HEMOGLOBIN (HGB A1C): Hemoglobin A1C: 6.5

## 2013-08-14 LAB — GLUCOSE, POCT (MANUAL RESULT ENTRY): POC Glucose: 132 mg/dl — AB (ref 70–99)

## 2013-08-14 MED ORDER — PANTOPRAZOLE SODIUM 40 MG PO TBEC: 40.0000 mg | DELAYED_RELEASE_TABLET | Freq: Every day | ORAL | Status: DC

## 2013-08-14 MED ORDER — METFORMIN HCL ER 750 MG PO TB24: 750.0000 mg | ORAL_TABLET | Freq: Every day | ORAL | Status: DC

## 2013-08-14 MED ORDER — VALSARTAN-HYDROCHLOROTHIAZIDE 160-12.5 MG PO TABS: 1.0000 | ORAL_TABLET | Freq: Every day | ORAL | Status: DC

## 2013-08-14 MED ORDER — BUDESONIDE-FORMOTEROL FUMARATE 80-4.5 MCG/ACT IN AERO: 2.0000 | INHALATION_SPRAY | Freq: Two times a day (BID) | RESPIRATORY_TRACT | Status: DC

## 2013-08-14 MED ORDER — CYCLOBENZAPRINE HCL 10 MG PO TABS: 10.0000 mg | ORAL_TABLET | Freq: Every day | ORAL | Status: DC

## 2013-08-14 MED ORDER — ACETAMINOPHEN-CODEINE #3 300-30 MG PO TABS: 1.0000 | ORAL_TABLET | ORAL | Status: DC | PRN

## 2013-08-14 MED ORDER — COLCHICINE 0.6 MG PO TABS: 0.6000 mg | ORAL_TABLET | Freq: Every day | ORAL | Status: DC

## 2013-08-14 MED ORDER — GABAPENTIN 100 MG PO CAPS: 100.0000 mg | ORAL_CAPSULE | Freq: Three times a day (TID) | ORAL | Status: DC

## 2013-08-14 NOTE — Progress Notes (Signed)
Pt is here following up on her HTN and diabetes. For the past 3 weeks pt has been having pain in her legs. Pt is requesting a physical and Pap smear.

## 2013-08-14 NOTE — Patient Instructions (Signed)
Diabetes Mellitus and Food It is important for you to manage your blood sugar (glucose) level. Your blood glucose level can be greatly affected by what you eat. Eating healthier foods in the appropriate amounts throughout the day at about the same time each day will help you control your blood glucose level. It can also help slow or prevent worsening of your diabetes mellitus. Healthy eating may even help you improve the level of your blood pressure and reach or maintain a healthy weight.  HOW CAN FOOD AFFECT ME? Carbohydrates Carbohydrates affect your blood glucose level more than any other type of food. Your dietitian will help you determine how many carbohydrates to eat at each meal and teach you how to count carbohydrates. Counting carbohydrates is important to keep your blood glucose at a healthy level, especially if you are using insulin or taking certain medicines for diabetes mellitus. Alcohol Alcohol can cause sudden decreases in blood glucose (hypoglycemia), especially if you use insulin or take certain medicines for diabetes mellitus. Hypoglycemia can be a life-threatening condition. Symptoms of hypoglycemia (sleepiness, dizziness, and disorientation) are similar to symptoms of having too much alcohol.  If your health care provider has given you approval to drink alcohol, do so in moderation and use the following guidelines:  Women should not have more than one drink per day, and men should not have more than two drinks per day. One drink is equal to:  12 oz of beer.  5 oz of wine.  1 oz of hard liquor.  Do not drink on an empty stomach.  Keep yourself hydrated. Have water, diet soda, or unsweetened iced tea.  Regular soda, juice, and other mixers might contain a lot of carbohydrates and should be counted. WHAT FOODS ARE NOT RECOMMENDED? As you make food choices, it is important to remember that all foods are not the same. Some foods have fewer nutrients per serving than other  foods, even though they might have the same number of calories or carbohydrates. It is difficult to get your body what it needs when you eat foods with fewer nutrients. Examples of foods that you should avoid that are high in calories and carbohydrates but low in nutrients include:  Trans fats (most processed foods list trans fats on the Nutrition Facts label).  Regular soda.  Juice.  Candy.  Sweets, such as cake, pie, doughnuts, and cookies.  Fried foods. WHAT FOODS CAN I EAT? Have nutrient-rich foods, which will nourish your body and keep you healthy. The food you should eat also will depend on several factors, including:  The calories you need.  The medicines you take.  Your weight.  Your blood glucose level.  Your blood pressure level.  Your cholesterol level. You also should eat a variety of foods, including:  Protein, such as meat, poultry, fish, tofu, nuts, and seeds (lean animal proteins are best).  Fruits.  Vegetables.  Dairy products, such as milk, cheese, and yogurt (low fat is best).  Breads, grains, pasta, cereal, rice, and beans.  Fats such as olive oil, trans fat-free margarine, canola oil, avocado, and olives. DOES EVERYONE WITH DIABETES MELLITUS HAVE THE SAME MEAL PLAN? Because every person with diabetes mellitus is different, there is not one meal plan that works for everyone. It is very important that you meet with a dietitian who will help you create a meal plan that is just right for you. Document Released: 10/16/2004 Document Revised: 01/24/2013 Document Reviewed: 12/16/2012 ExitCare Patient Information 2015 [employer]. This   information is not intended to replace advice given to you by your health care provider. Make sure you discuss any questions you have with your health care provider. Diabetes and Exercise Exercising regularly is important. It is not just about losing weight. It has many health benefits, such as:  Improving your overall  fitness, flexibility, and endurance.  Increasing your bone density.  Helping with weight control.  Decreasing your body fat.  Increasing your muscle strength.  Reducing stress and tension.  Improving your overall health. People with diabetes who exercise gain additional benefits because exercise:  Reduces appetite.  Improves the body's use of blood sugar (glucose).  Helps lower or control blood glucose.  Decreases blood pressure.  Helps control blood lipids (such as cholesterol and triglycerides).  Improves the body's use of the hormone insulin by:  Increasing the body's insulin sensitivity.  Reducing the body's insulin needs.  Decreases the risk for heart disease because exercising:  Lowers cholesterol and triglycerides levels.  Increases the levels of good cholesterol (such as high-density lipoproteins [HDL]) in the body.  Lowers blood glucose levels. YOUR ACTIVITY PLAN  Choose an activity that you enjoy and set realistic goals. Your health care provider or diabetes educator can help you make an activity plan that works for you. You can break activities into 2 or 3 sessions throughout the day. Doing so is as good as one long session. Exercise ideas include:  Taking the dog for a walk.  Taking the stairs instead of the elevator.  Dancing to your favorite song.  Doing your favorite exercise with a friend. RECOMMENDATIONS FOR EXERCISING WITH TYPE 1 OR TYPE 2 DIABETES   Check your blood glucose before exercising. If blood glucose levels are greater than 240 mg/dL, check for urine ketones. Do not exercise if ketones are present.  Avoid injecting insulin into areas of the body that are going to be exercised. For example, avoid injecting insulin into:  The arms when playing tennis.  The legs when jogging.  Keep a record of:  Food intake before and after you exercise.  Expected peak times of insulin action.  Blood glucose levels before and after you  exercise.  The type and amount of exercise you have done.  Review your records with your health care provider. Your health care provider will help you to develop guidelines for adjusting food intake and insulin amounts before and after exercising.  If you take insulin or oral hypoglycemic agents, watch for signs and symptoms of hypoglycemia. They include:  Dizziness.  Shaking.  Sweating.  Chills.  Confusion.  Drink plenty of water while you exercise to prevent dehydration or heat stroke. Body water is lost during exercise and must be replaced.  Talk to your health care provider before starting an exercise program to make sure it is safe for you. Remember, almost any type of activity is better than none. Document Released: 04/11/2003 Document Revised: 09/21/2012 Document Reviewed: 06/28/2012 [facility] Patient Information 2015 [place]. This information is not intended to replace advice given to you by your health care provider. Make sure you discuss any questions you have with your health care provider. Hypertension Hypertension, commonly called high blood pressure, is when the force of blood pumping through your arteries is too strong. Your arteries are the blood vessels that carry blood from your heart throughout your body. A blood pressure reading consists of a higher number over a lower number, such as 110/72. The higher number (systolic) is the pressure inside your arteries  when your heart pumps. The lower number (diastolic) is the pressure inside your arteries when your heart relaxes. Ideally you want your blood pressure below 120/80. Hypertension forces your heart to work harder to pump blood. Your arteries may become narrow or stiff. Having hypertension puts you at risk for heart disease, stroke, and other problems.  RISK FACTORS Some risk factors for high blood pressure are controllable. Others are not.  Risk factors you cannot control include:   Race. You may be at higher  risk if you are African American.  Age. Risk increases with age.  Gender. Men are at higher risk than women before age 34 years. After age 80, women are at higher risk than men. Risk factors you can control include:  Not getting enough exercise or physical activity.  Being overweight.  Getting too much fat, sugar, calories, or salt in your diet.  Drinking too much alcohol. SIGNS AND SYMPTOMS Hypertension does not usually cause signs or symptoms. Extremely high blood pressure (hypertensive crisis) may cause headache, anxiety, shortness of breath, and nosebleed. DIAGNOSIS  To check if you have hypertension, your health care provider will measure your blood pressure while you are seated, with your arm held at the level of your heart. It should be measured at least twice using the same arm. Certain conditions can cause a difference in blood pressure between your right and left arms. A blood pressure reading that is higher than normal on one occasion does not mean that you need treatment. If one blood pressure reading is high, ask your health care provider about having it checked again. TREATMENT  Treating high blood pressure includes making lifestyle changes and possibly taking medication. Living a healthy lifestyle can help lower high blood pressure. You may need to change some of your habits. Lifestyle changes may include:  Following the DASH diet. This diet is high in fruits, vegetables, and whole grains. It is low in salt, red meat, and added sugars.  Getting at least 2 1/2 hours of brisk physical activity every week.  Losing weight if necessary.  Not smoking.  Limiting alcoholic beverages.  Learning ways to reduce stress. If lifestyle changes are not enough to get your blood pressure under control, your health care provider may prescribe medicine. You may need to take more than one. Work closely with your health care provider to understand the risks and benefits. HOME CARE  INSTRUCTIONS  Have your blood pressure rechecked as directed by your health care provider.   Only take medicine as directed by your health care provider. Follow the directions carefully. Blood pressure medicines must be taken as prescribed. The medicine does not work as well when you skip doses. Skipping doses also puts you at risk for problems.   Do not smoke.   Monitor your blood pressure at home as directed by your health care provider. SEEK MEDICAL CARE IF:   You think you are having a reaction to medicines taken.  You have recurrent headaches or feel dizzy.  You have swelling in your ankles.  You have trouble with your vision. SEEK IMMEDIATE MEDICAL CARE IF:  You develop a severe headache or confusion.  You have unusual weakness, numbness, or feel faint.  You have severe chest or abdominal pain.  You vomit repeatedly.  You have trouble breathing. MAKE SURE YOU:   Understand these instructions.  Will watch your condition.  Will get help right away if you are not doing well or get worse. Document Released: 01/19/2005 Document Revised:  01/24/2013 Document Reviewed: 11/11/2012 ExitCare Patient Information 2015 [place], [place]. This information is not intended to replace advice given to you by your health care provider. Make sure you discuss any questions you have with your health care provider.

## 2013-08-14 NOTE — Progress Notes (Signed)
Patient ID: Stacy [name], female   DOB: [birth year], 53 y.o.   MRN: [id number]   Stacy [name], is a 53 y.o. female  [id number]:[id number]  [id number]  DOB - 1960/09/29  Chief Complaint  Patient presents with  . Follow-up        Subjective:   Stacy [name] is a 53 y.o. female here today for a follow up visit. Patient has history of essential hypertension, GERD, COPD and diabetes here today for her routine followup and also to have an annual physical examination. She complains of pain in her legs for the past 3 weeks, feels like pain and needles. She denies any trauma. Her medications are listed below. She has not had colonoscopy. She also needs refills on her medications. Patient has No headache, No chest pain, No abdominal pain - No Nausea, No new weakness tingling or numbness, No Cough - SOB.  Problem  Bilateral Low Back Pain Without Sciatica  Essential Hypertension  Gastroesophageal Reflux Disease Without Esophagitis  Copd Exacerbation  Colon Cancer Screening    ALLERGIES: Allergies  Allergen Reactions  . Other Shortness Of Breath    Allergic to perfumes and cleaning products  . Aspirin Nausea Only    unknown  . Ace Inhibitors     Cough, wheeze    PAST MEDICAL HISTORY: Past Medical History  Diagnosis Date  . Hypertension     Pt on lisinopril  . GERD (gastroesophageal reflux disease)     Pt on Protonix daily  . Arthritis     Takes Naprosyn   . Diabetes mellitus     Takes Metformin 750 mg  . Hyperlipidemia   . Asthma   . COPD (chronic obstructive pulmonary disease)   . Shortness of breath     occasional - uses breathing tx at home  . Headache(784.0)     otc meds prn  . Irritable bowel syndrome 11/19/2010    MEDICATIONS AT HOME: Prior to Admission medications   Medication Sig Start Date End Date Taking? Authorizing Provider  albuterol (PROVENTIL) (2.5 MG/3ML) 0.083% nebulizer solution Take 3 mLs (2.5 mg total) by nebulization every 4 (four) hours as needed  for wheezing. 05/15/13  Yes Angelica Chessman, MD  budesonide-formoterol (SYMBICORT) 80-4.5 MCG/ACT inhaler Inhale 2 puffs into the lungs 2 (two) times daily. 08/14/13 08/14/14 Yes Angelica Chessman, MD  colchicine 0.6 MG tablet Take 1 tablet (0.6 mg total) by mouth daily. 08/14/13  Yes Angelica Chessman, MD  cyclobenzaprine (FLEXERIL) 10 MG tablet Take 1 tablet (10 mg total) by mouth at bedtime. 08/14/13  Yes Angelica Chessman, MD  EPINEPHrine (EPI-PEN) 0.3 mg/0.3 mL DEVI Inject 0.3 mg into the muscle once.   Yes Historical Provider, MD  gabapentin (NEURONTIN) 100 MG capsule Take 1 capsule (100 mg total) by mouth 3 (three) times daily. 08/14/13  Yes Angelica Chessman, MD  glucose monitoring kit (FREESTYLE) monitoring kit 1 each by Does not apply route as needed for other. 02/16/13  Yes Lorayne Marek, MD  hydrOXYzine (ATARAX/VISTARIL) 10 MG tablet Take 1 tablet (10 mg total) by mouth 3 (three) times daily as needed. 02/16/13  Yes Lorayne Marek, MD  Lancets (FREESTYLE) lancets Use as instructed 05/15/13  Yes Angelica Chessman, MD  meloxicam (MOBIC) 15 MG tablet Take 1 tablet (15 mg total) by mouth daily. 05/30/13  Yes Duane Boston, MD  metFORMIN (GLUCOPHAGE-XR) 750 MG 24 hr tablet Take 1 tablet (750 mg total) by mouth daily with breakfast. 08/14/13  Yes Angelica Chessman, MD  metoCLOPramide (REGLAN) 5 MG tablet  Take 1 tablet (5 mg total) by mouth every 8 (eight) hours as needed for nausea or vomiting. 05/15/13  Yes Angelica Chessman, MD  ondansetron (ZOFRAN) 4 MG tablet Take 1 tablet (4 mg total) by mouth every 6 (six) hours as needed for nausea or vomiting. 01/14/13  Yes Ephraim Hamburger, MD  pantoprazole (PROTONIX) 40 MG tablet Take 1 tablet (40 mg total) by mouth daily. 08/14/13  Yes Angelica Chessman, MD  valsartan-hydrochlorothiazide (DIOVAN-HCT) 160-12.5 MG per tablet Take 1 tablet by mouth daily. 08/14/13  Yes Angelica Chessman, MD  Vitamin D, Ergocalciferol, (DRISDOL) 50000 UNITS CAPS capsule Take 1 capsule  (50,000 Units total) by mouth every 7 (seven) days. 02/17/13  Yes Lorayne Marek, MD  acetaminophen-codeine (TYLENOL #3) 300-30 MG per tablet Take 1 tablet by mouth every 4 (four) hours as needed. 08/14/13   Angelica Chessman, MD  Tuberculin-Allergy Syringes (ALLERGY SYRINGE 1CC/28GX1/2") 28G X 1/2" 1 ML MISC As directed by allergist 02/16/13   Lorayne Marek, MD     Objective:   Filed Vitals:   08/14/13 0934  BP: 124/84  Pulse: 67  Temp: 98.6 F (37 C)  TempSrc: Oral  Resp: 16  Height: 5' 6" (1.676 m)  Weight: 183 lb (83.008 kg)  SpO2: 98%    Exam General appearance : Awake, alert, not in any distress. Speech Clear. Not toxic looking HEENT: Atraumatic and Normocephalic, pupils equally reactive to light and accomodation Neck: supple, no JVD. No cervical lymphadenopathy.  Chest:Good air entry bilaterally, no added sounds  CVS: S1 S2 regular, no murmurs.  Abdomen: Bowel sounds present, Non tender and not distended with no gaurding, rigidity or rebound. Extremities: B/L Lower Ext shows no edema, both legs are warm to touch Neurology: Awake alert, and oriented X 3, CN II-XII intact, Non focal Skin:No Rash Wounds:N/A  Data Review Lab Results  Component Value Date   HGBA1C 6.5 08/14/2013   HGBA1C 6.6 05/15/2013   HGBA1C 6.4 02/16/2013     Assessment & Plan   1. Type 2 diabetes mellitus without complication  - Glucose (CBG) - HgB A1c is 6.5% today is again 6.6% in April Refill  Prescribed gabapentin for possible diabetic neuropathy and - gabapentin (NEURONTIN) 100 MG capsule; Take 1 capsule (100 mg total) by mouth 3 (three) times daily.  Dispense: 270 capsule; Refill: 3 - metFORMIN (GLUCOPHAGE-XR) 750 MG 24 hr tablet; Take 1 tablet (750 mg total) by mouth daily with breakfast.  Dispense: 90 tablet; Refill: 3  Aim for 2-3 Carb Choices per meal (30-45 grams) +/- 1 either way  Aim for 0-15 Carbs per snack if hungry  Include protein in moderation with your meals and snacks    Consider reading food labels for Total Carbohydrate and Fat Grams of foods  Fruit Punch - find one with no sugar  Measure and decrease portions of carbohydrate foods  Make your plate and don't go back for seconds   2. Essential hypertension  - valsartan-hydrochlorothiazide (DIOVAN-HCT) 160-12.5 MG per tablet; Take 1 tablet by mouth daily.  Dispense: 90 tablet; Refill: 3 DASH diet  3. Bilateral low back pain without sciatica, lumbar x-ray shows degenerative changes Prescribed  - acetaminophen-codeine (TYLENOL #3) 300-30 MG per tablet; Take 1 tablet by mouth every 4 (four) hours as needed.  Dispense: 60 tablet; Refill: 0 - colchicine 0.6 MG tablet; Take 1 tablet (0.6 mg total) by mouth daily.  Dispense: 90 tablet; Refill: 3 - cyclobenzaprine (FLEXERIL) 10 MG tablet; Take 1 tablet (10 mg total) by mouth  at bedtime.  Dispense: 90 tablet; Refill: 3  4. Gastroesophageal reflux disease without esophagitis  - pantoprazole (PROTONIX) 40 MG tablet; Take 1 tablet (40 mg total) by mouth daily.  Dispense: 90 tablet; Refill: 3  5. COPD exacerbation - budesonide-formoterol (SYMBICORT) 80-4.5 MCG/ACT inhaler; Inhale 2 puffs into the lungs 2 (two) times daily.  Dispense: 3 Inhaler; Refill: 3  6. Colon cancer screening  - [facility] COLONOSCOPY - Ambulatory referral to Gastroenterology  Patient was counseled extensively about nutrition and exercise  Return in about 3 months (around 11/14/2013), or if symptoms worsen or fail to improve, for Hemoglobin A1C and Follow up, DM, Follow up HTN.  The patient was given clear instructions to go to ER or return to medical center if symptoms don't improve, worsen or new problems develop. The patient verbalized understanding. The patient was told to call to get lab results if they haven't heard anything in the next week.   This note has been created with Surveyor, quantity. Any transcriptional errors are unintentional.     Angelica Chessman, MD, [place], [place], [place] [facility] [place], [place]   08/14/2013, 11:23 AM

## 2013-08-15 LAB — CYTOLOGY - PAP

## 2013-08-16 ENCOUNTER — Telehealth: Payer: Self-pay | Admitting: Emergency Medicine

## 2013-08-16 MED ORDER — FLUCONAZOLE 200 MG PO TABS: 200.0000 mg | ORAL_TABLET | Freq: Every day | ORAL | Status: DC

## 2013-08-16 NOTE — Telephone Encounter (Signed)
Pt given test results. Medication Diflucan 200 mg tab ordered and e-scribed to [facility] pharmacy. Informed we are awaiting pap smear cytology results

## 2013-08-16 NOTE — Telephone Encounter (Signed)
Message copied by Darlis LoanSMITH, Beda Dula D on Wed Aug 16, 2013  1:09 PM ------      Message from: Quentin AngstJEGEDE, OLUGBEMIGA E      Created: Tue Aug 15, 2013  5:47 PM       Please inform patient that her Pap smear came back positive for yeast infection. We will treat with medication. We're still waiting for the Pap smear cytology            Please call in prescription Diflucan 200 mg tablet by mouth once repeat after one week             ------

## 2013-08-17 ENCOUNTER — Telehealth: Payer: Self-pay

## 2013-08-17 ENCOUNTER — Telehealth: Payer: Self-pay | Admitting: Internal Medicine

## 2013-08-17 NOTE — Telephone Encounter (Signed)
Home number is disconnected Patient not available on cell Left message on cell phone to return our call

## 2013-08-17 NOTE — Telephone Encounter (Signed)
Message copied by Lestine MountJUAREZ, Dariyon Urquilla L on Thu Aug 17, 2013 12:46 PM ------      Message from: Jeanann LewandowskyJEGEDE, OLUGBEMIGA E      Created: Wed Aug 16, 2013  5:45 PM       Please inform patient that her Pap smear result is negative for malignancy ------

## 2013-08-17 NOTE — Telephone Encounter (Signed)
Pt returning phone call from nurse in regards to test results

## 2013-08-23 ENCOUNTER — Telehealth: Payer: Self-pay

## 2013-08-23 NOTE — Telephone Encounter (Signed)
Returned patient phone call Patient is aware of her pap smear results And to pick up prescription at the pharmacy

## 2013-10-17 ENCOUNTER — Encounter: Payer: Self-pay | Admitting: Internal Medicine

## 2013-12-12 ENCOUNTER — Emergency Department (HOSPITAL_COMMUNITY)
Admission: EM | Admit: 2013-12-12 | Discharge: 2013-12-12 | Disposition: A | Discharge status: Home / Self Care | Payer: Self-pay | Attending: Emergency Medicine | Admitting: Emergency Medicine

## 2013-12-12 ENCOUNTER — Encounter (HOSPITAL_COMMUNITY): Payer: Self-pay | Admitting: Emergency Medicine

## 2013-12-12 DIAGNOSIS — I10 Essential (primary) hypertension: Secondary | ICD-10-CM | POA: Insufficient documentation

## 2013-12-12 DIAGNOSIS — E119 Type 2 diabetes mellitus without complications: Secondary | ICD-10-CM | POA: Insufficient documentation

## 2013-12-12 DIAGNOSIS — J449 Chronic obstructive pulmonary disease, unspecified: Secondary | ICD-10-CM | POA: Insufficient documentation

## 2013-12-12 DIAGNOSIS — K219 Gastro-esophageal reflux disease without esophagitis: Secondary | ICD-10-CM | POA: Insufficient documentation

## 2013-12-12 DIAGNOSIS — Z791 Long term (current) use of non-steroidal anti-inflammatories (NSAID): Secondary | ICD-10-CM | POA: Insufficient documentation

## 2013-12-12 DIAGNOSIS — L02212 Cutaneous abscess of back [any part, except buttock]: Secondary | ICD-10-CM | POA: Insufficient documentation

## 2013-12-12 DIAGNOSIS — Z79899 Other long term (current) drug therapy: Secondary | ICD-10-CM | POA: Insufficient documentation

## 2013-12-12 DIAGNOSIS — Z87891 Personal history of nicotine dependence: Secondary | ICD-10-CM | POA: Insufficient documentation

## 2013-12-12 DIAGNOSIS — M1990 Unspecified osteoarthritis, unspecified site: Secondary | ICD-10-CM | POA: Insufficient documentation

## 2013-12-12 MED ORDER — LIDOCAINE-EPINEPHRINE 2 %-1:100000 IJ SOLN
INTRAMUSCULAR | Status: AC
  Filled 2013-12-12: qty 1

## 2013-12-12 MED ORDER — SULFAMETHOXAZOLE-TRIMETHOPRIM 800-160 MG PO TABS
1.0000 | ORAL_TABLET | Freq: Once | ORAL | Status: AC
  Administered 2013-12-12: 1 via ORAL
  Filled 2013-12-12: qty 1

## 2013-12-12 MED ORDER — LIDOCAINE-EPINEPHRINE 1 %-1:100000 IJ SOLN
10.0000 mL | Freq: Once | INTRAMUSCULAR | Status: AC
  Administered 2013-12-12: 10 mL
  Filled 2013-12-12: qty 1

## 2013-12-12 MED ORDER — IBUPROFEN 200 MG PO TABS
600.0000 mg | ORAL_TABLET | Freq: Once | ORAL | Status: AC
  Administered 2013-12-12: 600 mg via ORAL
  Filled 2013-12-12: qty 3

## 2013-12-12 MED ORDER — SULFAMETHOXAZOLE-TRIMETHOPRIM 800-160 MG PO TABS
1.0000 | ORAL_TABLET | Freq: Two times a day (BID) | ORAL | Status: DC
Start: 2013-12-12 — End: 2014-07-19

## 2013-12-12 MED ORDER — OXYCODONE-ACETAMINOPHEN 5-325 MG PO TABS
1.0000 | ORAL_TABLET | Freq: Once | ORAL | Status: AC
  Administered 2013-12-12: 1 via ORAL
  Filled 2013-12-12: qty 1

## 2013-12-12 MED ORDER — OXYCODONE-ACETAMINOPHEN 5-325 MG PO TABS
1.0000 | ORAL_TABLET | ORAL | Status: DC | PRN
Start: 2013-12-12 — End: 2014-07-19

## 2013-12-12 NOTE — Progress Notes (Signed)
[facility] [place],  [place] about following up with [facility] [employer] [employer] [facility]. Tried to schedule f/u but was unable to at this time. [facility] stated that they would contact patient for appt next week. [facility] will f[facility]u.

## 2013-12-12 NOTE — ED Provider Notes (Signed)
CSN: [id number]     Arrival date & time 12/12/13  9323 History   First MD Initiated Contact with Patient 12/12/13 [phone]     Chief Complaint  Patient presents with  . Abscess     (Consider location/radiation/quality/duration/timing/severity/associated sxs/prior Treatment) HPI   53 year old female with a painful lesion to her upper back. She is unsure of the exact onset but has been following her for Lattie Haw last few days. Raised, tender. Some mild bleeding in purulent appearing drainage. No fevers or chills. No nausea or vomiting. No intervention prior to arrival.  Past Medical History  Diagnosis Date  . Hypertension     Pt on lisinopril  . GERD (gastroesophageal reflux disease)     Pt on Protonix daily  . Arthritis     Takes Naprosyn   . Diabetes mellitus     Takes Metformin 750 mg  . Hyperlipidemia   . Asthma   . COPD (chronic obstructive pulmonary disease)   . Shortness of breath     occasional - uses breathing tx at home  . Headache(784.0)     otc meds prn  . Irritable bowel syndrome 11/19/2010   Past Surgical History  Procedure Laterality Date  . Hernia repair    . Cholecystectomy    . Svd       x 2  . Tubal ligation    . Endometrial ablation  10/2010  . Upper gastrointestinal endoscopy  04/28/11   Family History  Problem Relation Age of Onset  . Asthma Son     had as a child  . Heart disease Sister   . Breast cancer Sister   . Hypertension Father   . Cancer Father   . Hypertension Brother    History  Substance Use Topics  . Smoking status: Former Smoker -- 1.00 packs/day for 10 years    Types: Cigarettes    Quit date: 09/28/2000  . Smokeless tobacco: Never Used  . Alcohol Use: No   OB History    No data available     Review of Systems  All systems reviewed and negative, other than as noted in HPI.   Allergies  Other; Aspirin; and Ace inhibitors  Home Medications   Prior to Admission medications   Medication Sig Start Date End Date Taking?  Authorizing Provider  acetaminophen-codeine (TYLENOL #3) 300-30 MG per tablet Take 1 tablet by mouth every 4 (four) hours as needed. 08/14/13   Tresa Garter, MD  albuterol (PROVENTIL) (2.5 MG/3ML) 0.083% nebulizer solution Take 3 mLs (2.5 mg total) by nebulization every 4 (four) hours as needed for wheezing. 05/15/13   Tresa Garter, MD  budesonide-formoterol (SYMBICORT) 80-4.5 MCG/ACT inhaler Inhale 2 puffs into the lungs 2 (two) times daily. 08/14/13 08/14/14  Tresa Garter, MD  colchicine 0.6 MG tablet Take 1 tablet (0.6 mg total) by mouth daily. 08/14/13   Tresa Garter, MD  cyclobenzaprine (FLEXERIL) 10 MG tablet Take 1 tablet (10 mg total) by mouth at bedtime. 08/14/13   Tresa Garter, MD  EPINEPHrine (EPI-PEN) 0.3 mg/0.3 mL DEVI Inject 0.3 mg into the muscle once.    Historical Provider, MD  fluconazole (DIFLUCAN) 200 MG tablet Take 1 tablet (200 mg total) by mouth daily. 08/16/13   Tresa Garter, MD  gabapentin (NEURONTIN) 100 MG capsule Take 1 capsule (100 mg total) by mouth 3 (three) times daily. 08/14/13   Tresa Garter, MD  glucose monitoring kit (FREESTYLE) monitoring kit 1 each by Does not  apply route as needed for other. 02/16/13   Lorayne Marek, MD  hydrOXYzine (ATARAX/VISTARIL) 10 MG tablet Take 1 tablet (10 mg total) by mouth 3 (three) times daily as needed. 02/16/13   Lorayne Marek, MD  Lancets (FREESTYLE) lancets Use as instructed 05/15/13   Tresa Garter, MD  meloxicam (MOBIC) 15 MG tablet Take 1 tablet (15 mg total) by mouth daily. 05/30/13   Duane Boston, MD  metFORMIN (GLUCOPHAGE-XR) 750 MG 24 hr tablet Take 1 tablet (750 mg total) by mouth daily with breakfast. 08/14/13   Tresa Garter, MD  metoCLOPramide (REGLAN) 5 MG tablet Take 1 tablet (5 mg total) by mouth every 8 (eight) hours as needed for nausea or vomiting. 05/15/13   Tresa Garter, MD  ondansetron (ZOFRAN) 4 MG tablet Take 1 tablet (4 mg total) by mouth every 6 (six)  hours as needed for nausea or vomiting. 01/14/13   Ephraim Hamburger, MD  pantoprazole (PROTONIX) 40 MG tablet Take 1 tablet (40 mg total) by mouth daily. 08/14/13   Tresa Garter, MD  Tuberculin-Allergy Syringes (ALLERGY SYRINGE 1CC/28GX1/2") 28G X 1/2" 1 ML MISC As directed by allergist 02/16/13   Lorayne Marek, MD  valsartan-hydrochlorothiazide (DIOVAN-HCT) 160-12.5 MG per tablet Take 1 tablet by mouth daily. 08/14/13   Tresa Garter, MD  Vitamin D, Ergocalciferol, (DRISDOL) 50000 UNITS CAPS capsule Take 1 capsule (50,000 Units total) by mouth every 7 (seven) days. 02/17/13   Lorayne Marek, MD   BP 143/84 mmHg  Pulse 77  Temp(Src) 99.2 F (37.3 C) (Oral)  Resp 16  SpO2 96% Physical Exam  Constitutional: She appears well-developed and well-nourished. No distress.  HENT:  Head: Normocephalic and atraumatic.  Eyes: Conjunctivae are normal. Right eye exhibits no discharge. Left eye exhibits no discharge.  Neck: Neck supple.  Cardiovascular: Normal rate, regular rhythm and normal heart sounds.  Exam reveals no gallop and no friction rub.   No murmur heard. Pulmonary/Chest: Effort normal and breath sounds normal. No respiratory distress.  Abdominal: Soft. She exhibits no distension. There is no tenderness.  Musculoskeletal: She exhibits no edema or tenderness.       Arms: Neurological: She is alert.  Skin: Skin is warm and dry.  Quarter sized carbuncle in depicted area  Psychiatric: She has a normal mood and affect. Her behavior is normal. Thought content normal.  Nursing note and vitals reviewed.   ED Course  Procedures (including critical care time)  INCISION AND DRAINAGE Performed by: Virgel Manifold Consent: Verbal consent obtained. Risks and benefits: risks, benefits and alternatives were discussed Type: abscess  Body area: upper back  Anesthesia: local infiltration  Incision was made with a scalpel.  Local anesthetic: lidocaine 2% w epinephrine  Anesthetic  total: 4 ml  Complexity: complex Blunt dissection to break up loculations  Drainage: purulent  Drainage amount: small  Packing material: none  Patient tolerance: Patient tolerated the procedure well with no immediate complications.   Labs Review Labs Reviewed - No data to display  Imaging Review No results found.   EKG Interpretation None      MDM   Final diagnoses:  Abscess of upper back excluding scapular region    53yF with abscess/carbuncle to upper back. I&D. Continued wound care and return precautions discussed.     Virgel Manifold, MD 12/14/13 1640

## 2013-12-12 NOTE — ED Notes (Signed)
Per pt, felt some irritation on back of neck-husband notice bump-raised area, red, irritated, no drainage noted

## 2013-12-12 NOTE — Discharge Instructions (Signed)

## 2014-01-01 ENCOUNTER — Ambulatory Visit: Payer: No Typology Code available for payment source | Attending: Internal Medicine | Admitting: Internal Medicine

## 2014-01-01 ENCOUNTER — Encounter: Payer: Self-pay | Admitting: Internal Medicine

## 2014-01-01 ENCOUNTER — Ambulatory Visit (HOSPITAL_BASED_OUTPATIENT_CLINIC_OR_DEPARTMENT_OTHER): Payer: No Typology Code available for payment source | Admitting: *Deleted

## 2014-01-01 VITALS — BP 116/78 | HR 62 | Temp 98.0°F | Resp 14 | Ht 66.0 in | Wt 179.0 lb

## 2014-01-01 DIAGNOSIS — Z23 Encounter for immunization: Secondary | ICD-10-CM

## 2014-01-01 DIAGNOSIS — I10 Essential (primary) hypertension: Secondary | ICD-10-CM | POA: Insufficient documentation

## 2014-01-01 DIAGNOSIS — J449 Chronic obstructive pulmonary disease, unspecified: Secondary | ICD-10-CM | POA: Insufficient documentation

## 2014-01-01 DIAGNOSIS — K219 Gastro-esophageal reflux disease without esophagitis: Secondary | ICD-10-CM | POA: Insufficient documentation

## 2014-01-01 DIAGNOSIS — M5441 Lumbago with sciatica, right side: Secondary | ICD-10-CM | POA: Insufficient documentation

## 2014-01-01 DIAGNOSIS — E119 Type 2 diabetes mellitus without complications: Secondary | ICD-10-CM

## 2014-01-01 DIAGNOSIS — M545 Low back pain, unspecified: Secondary | ICD-10-CM

## 2014-01-01 DIAGNOSIS — J45909 Unspecified asthma, uncomplicated: Secondary | ICD-10-CM | POA: Insufficient documentation

## 2014-01-01 LAB — POCT GLYCOSYLATED HEMOGLOBIN (HGB A1C): Hemoglobin A1C: 6.7

## 2014-01-01 MED ORDER — PANTOPRAZOLE SODIUM 40 MG PO TBEC: 40.0000 mg | DELAYED_RELEASE_TABLET | Freq: Every day | ORAL | Status: DC

## 2014-01-01 MED ORDER — METFORMIN HCL ER 750 MG PO TB24: 750.0000 mg | ORAL_TABLET | Freq: Every day | ORAL | Status: DC

## 2014-01-01 MED ORDER — FREESTYLE SYSTEM KIT: 1.0000 | PACK | Status: DC | PRN

## 2014-01-01 MED ORDER — ALLOPURINOL 100 MG PO TABS: 100.0000 mg | ORAL_TABLET | Freq: Every day | ORAL | Status: DC

## 2014-01-01 MED ORDER — VALSARTAN-HYDROCHLOROTHIAZIDE 160-12.5 MG PO TABS: 1.0000 | ORAL_TABLET | Freq: Every day | ORAL | Status: DC

## 2014-01-01 MED ORDER — ACETAMINOPHEN-CODEINE #3 300-30 MG PO TABS: 1.0000 | ORAL_TABLET | ORAL | Status: DC | PRN

## 2014-01-01 MED ORDER — GABAPENTIN 300 MG PO CAPS: 300.0000 mg | ORAL_CAPSULE | Freq: Three times a day (TID) | ORAL | Status: DC

## 2014-01-01 NOTE — Progress Notes (Signed)
Patient ID: Stacy [name], female   DOB: 02/16/1960, 53 y.o.   MRN: [id number]   Stacy [name], is a 53 y.o. female  QRF:[id number]  IYM:[id number]  DOB - [birth year]  Chief Complaint  Patient presents with  . Follow-up        Subjective:   Stacy [name] is a 53 y.o. female here today for a follow up visit. Patient has history of essential hypertension, GERD, COPD and diabetes mellitus here today for routine follow-up. Patient was recently seen in the ED for abscess on her neck, drained, she finished antibiotics. Today she needs refill of her medications including test strips. She has no other complaint. She is compliant with medications. She reports no side effects. Patient has No headache, No chest pain, No abdominal pain - No Nausea, No new weakness tingling or numbness, No Cough - SOB.  Problem  Midline Low Back Pain With Right-Sided Sciatica    ALLERGIES: Allergies  Allergen Reactions  . Other Shortness Of Breath    Allergic to perfumes and cleaning products  . Aspirin Nausea Only    unknown  . Ace Inhibitors     Cough, wheeze    PAST MEDICAL HISTORY: Past Medical History  Diagnosis Date  . Hypertension     Pt on lisinopril  . GERD (gastroesophageal reflux disease)     Pt on Protonix daily  . Arthritis     Takes Naprosyn   . Diabetes mellitus     Takes Metformin 750 mg  . Hyperlipidemia   . Asthma   . COPD (chronic obstructive pulmonary disease)   . Shortness of breath     occasional - uses breathing tx at home  . Headache(784.0)     otc meds prn  . Irritable bowel syndrome 11/19/2010    MEDICATIONS AT HOME: Prior to Admission medications   Medication Sig Start Date End Date Taking? Authorizing Provider  albuterol (PROVENTIL) (2.5 MG/3ML) 0.083% nebulizer solution Take 3 mLs (2.5 mg total) by nebulization every 4 (four) hours as needed for wheezing. 05/15/13  Yes Tresa Garter, MD  allopurinol (ZYLOPRIM) 100 MG tablet Take 1 tablet (100 mg total) by  mouth daily. 01/01/14  Yes Tresa Garter, MD  cyclobenzaprine (FLEXERIL) 10 MG tablet Take 1 tablet (10 mg total) by mouth at bedtime. 08/14/13  Yes Tresa Garter, MD  gabapentin (NEURONTIN) 300 MG capsule Take 1 capsule (300 mg total) by mouth 3 (three) times daily. 01/01/14  Yes Tresa Garter, MD  glucose monitoring kit (FREESTYLE) monitoring kit 1 each by Does not apply route as needed for other. 01/01/14  Yes Tresa Garter, MD  hydrOXYzine (ATARAX/VISTARIL) 10 MG tablet Take 1 tablet (10 mg total) by mouth 3 (three) times daily as needed. 02/16/13  Yes Lorayne Marek, MD  Lancets (FREESTYLE) lancets Use as instructed 05/15/13  Yes Tresa Garter, MD  metFORMIN (GLUCOPHAGE-XR) 750 MG 24 hr tablet Take 1 tablet (750 mg total) by mouth daily with breakfast. 01/01/14  Yes Mariesa Grieder E Doreene Burke, MD  pantoprazole (PROTONIX) 40 MG tablet Take 1 tablet (40 mg total) by mouth daily. 01/01/14  Yes Tresa Garter, MD  valsartan-hydrochlorothiazide (DIOVAN-HCT) 160-12.5 MG per tablet Take 1 tablet by mouth daily. 01/01/14  Yes Tresa Garter, MD  acetaminophen-codeine (TYLENOL #3) 300-30 MG per tablet Take 1 tablet by mouth every 4 (four) hours as needed. 01/01/14   Tresa Garter, MD  budesonide-formoterol (SYMBICORT) 80-4.5 MCG/ACT inhaler Inhale 2 puffs into the lungs 2 (two)  times daily. Patient not taking: Reported on 01/01/2014 08/14/13 08/14/14  Tresa Garter, MD  colchicine 0.6 MG tablet Take 1 tablet (0.6 mg total) by mouth daily. Patient not taking: Reported on 01/01/2014 08/14/13   Tresa Garter, MD  EPINEPHrine (EPI-PEN) 0.3 mg/0.3 mL DEVI Inject 0.3 mg into the muscle once.    Historical Provider, MD  fluconazole (DIFLUCAN) 200 MG tablet Take 1 tablet (200 mg total) by mouth daily. Patient not taking: Reported on 01/01/2014 08/16/13   Tresa Garter, MD  meloxicam (MOBIC) 15 MG tablet Take 1 tablet (15 mg total) by mouth daily. Patient not  taking: Reported on 01/01/2014 05/30/13   Duane Boston, MD  metoCLOPramide (REGLAN) 5 MG tablet Take 1 tablet (5 mg total) by mouth every 8 (eight) hours as needed for nausea or vomiting. Patient not taking: Reported on 01/01/2014 05/15/13   Tresa Garter, MD  ondansetron (ZOFRAN) 4 MG tablet Take 1 tablet (4 mg total) by mouth every 6 (six) hours as needed for nausea or vomiting. Patient not taking: Reported on 01/01/2014 01/14/13   Ephraim Hamburger, MD  oxyCODONE-acetaminophen (PERCOCET/ROXICET) 5-325 MG per tablet Take 1 tablet by mouth every 4 (four) hours as needed for severe pain. Patient not taking: Reported on 01/01/2014 12/12/13   Virgel Manifold, MD  sulfamethoxazole-trimethoprim [facility] DS) 800-160 MG per tablet Take 1 tablet by mouth every 12 (twelve) hours. Patient not taking: Reported on 01/01/2014 12/12/13   Virgel Manifold, MD  Tuberculin-Allergy Syringes (ALLERGY SYRINGE 1CC/28GX1/2") 28G X 1/2" 1 ML MISC As directed by allergist Patient not taking: Reported on 01/01/2014 02/16/13   Lorayne Marek, MD  Vitamin D, Ergocalciferol, (DRISDOL) 50000 UNITS CAPS capsule Take 1 capsule (50,000 Units total) by mouth every 7 (seven) days. 02/17/13   Lorayne Marek, MD     Objective:   Filed Vitals:   01/01/14 1455  BP: 116/78  Pulse: 62  Temp: 98 F (36.7 C)  TempSrc: Oral  Resp: 14  Height: _0  (1.676 m)  Weight: 179 lb (81.194 kg)  SpO2: 97%    Exam General appearance : Awake, alert, not in any distress. Speech Clear. Not toxic looking HEENT: Atraumatic and Normocephalic, pupils equally reactive to light and accomodation Neck: supple, no JVD. No cervical lymphadenopathy.  Chest:Good air entry bilaterally, no added sounds  CVS: S1 S2 regular, no murmurs.  Abdomen: Bowel sounds present, Non tender and not distended with no gaurding, rigidity or rebound. Extremities: B/L Lower Ext shows no edema, both legs are warm to touch Neurology: Awake alert, and oriented X 3, CN II-XII  intact, Non focal  Data Review Lab Results  Component Value Date   HGBA1C 6.7 01/01/2014   HGBA1C 6.5 08/14/2013   HGBA1C 6.6 05/15/2013     Assessment & Plan   1. Type 2 diabetes mellitus  - Glucose (CBG) - HgB A1c is 6.7% today - gabapentin (NEURONTIN) 300 MG capsule; Take 1 capsule (300 mg total) by mouth 3 (three) times daily.  Dispense: 270 capsule; Refill: 3 - metFORMIN (GLUCOPHAGE-XR) 750 MG 24 hr tablet; Take 1 tablet (750 mg total) by mouth daily with breakfast.  Dispense: 90 tablet; Refill: 3  2. Essential hypertension  - valsartan-hydrochlorothiazide (DIOVAN-HCT) 160-12.5 MG per tablet; Take 1 tablet by mouth daily.  Dispense: 90 tablet; Refill: 3  - We have discussed target BP range and blood pressure goal - I have advised patient to check BP regularly and to call us back or report to clinic  if the numbers are consistently higher than 140/90  - We discussed the importance of compliance with medical therapy and DASH diet recommended, consequences of uncontrolled hypertension discussed.  - continue current BP medications  3. Bilateral low back pain without sciatica  - acetaminophen-codeine (TYLENOL #3) 300-30 MG per tablet; Take 1 tablet by mouth every 4 (four) hours as needed.  Dispense: 60 tablet; Refill: 0  4. Gastroesophageal reflux disease without esophagitis  - pantoprazole (PROTONIX) 40 MG tablet; Take 1 tablet (40 mg total) by mouth daily.  Dispense: 90 tablet; Refill: 3  Aim for 2-3 Carb Choices per meal (30-45 grams) +/- 1 either way  Aim for 0-15 Carbs per snack if hungry  Include protein in moderation with your meals and snacks  Consider reading food labels for Total Carbohydrate and Fat Grams of foods  Consider checking BG at alternate times per day  Continue taking medication as directed Fruit Punch - find one with no sugar  Measure and decrease portions of carbohydrate foods  Make your plate and don't go back for seconds    Return in about 3  months (around 04/02/2014), or if symptoms worsen or fail to improve, for Hemoglobin A1C and Follow up, DM, Follow up HTN.  The patient was given clear instructions to go to ER or return to medical center if symptoms don't improve, worsen or new problems develop. The patient verbalized understanding. The patient was told to call to get lab results if they haven't heard anything in the next week.   This note has been created with Surveyor, quantity. Any transcriptional errors are unintentional.    Angelica Chessman, MD, [place], [place], [place] and [place], [place]   01/01/2014, 3:52 PM

## 2014-01-01 NOTE — Patient Instructions (Signed)
DASH Eating Plan DASH stands for "Dietary Approaches to Stop Hypertension." The DASH eating plan is a healthy eating plan that has been shown to reduce high blood pressure (hypertension). Additional health benefits may include reducing the risk of type 2 diabetes mellitus, heart disease, and stroke. The DASH eating plan may also help with weight loss. WHAT DO I NEED TO KNOW ABOUT THE DASH EATING PLAN? For the DASH eating plan, you will follow these general guidelines:  Choose foods with a percent daily value for sodium of less than 5% (as listed on the food label).  Use salt-free seasonings or herbs instead of table salt or sea salt.  Check with your health care provider or pharmacist before using salt substitutes.  Eat lower-sodium products, often labeled as "lower sodium" or "no salt added."  Eat fresh foods.  Eat more vegetables, fruits, and low-fat dairy products.  Choose whole grains. Look for the word "whole" as the first word in the ingredient list.  Choose fish and skinless chicken or turkey more often than red meat. Limit fish, poultry, and meat to 6 oz (170 g) each day.  Limit sweets, desserts, sugars, and sugary drinks.  Choose heart-healthy fats.  Limit cheese to 1 oz (28 g) per day.  Eat more home-cooked food and less restaurant, buffet, and fast food.  Limit fried foods.  Cook foods using methods other than frying.  Limit canned vegetables. If you do use them, rinse them well to decrease the sodium.  When eating at a restaurant, ask that your food be prepared with less salt, or no salt if possible. WHAT FOODS CAN I EAT? Seek help from a dietitian for individual calorie needs. Grains Whole grain or whole wheat bread. Brown rice. Whole grain or whole wheat pasta. Quinoa, bulgur, and whole grain cereals. Low-sodium cereals. Corn or whole wheat flour tortillas. Whole grain cornbread. Whole grain crackers. Low-sodium crackers. Vegetables Fresh or frozen vegetables  (raw, steamed, roasted, or grilled). Low-sodium or reduced-sodium tomato and vegetable juices. Low-sodium or reduced-sodium tomato sauce and paste. Low-sodium or reduced-sodium canned vegetables.  Fruits All fresh, canned (in natural juice), or frozen fruits. Meat and Other Protein Products Ground beef (85% or leaner), grass-fed beef, or beef trimmed of fat. Skinless chicken or turkey. Ground chicken or turkey. Pork trimmed of fat. All fish and seafood. Eggs. Dried beans, peas, or lentils. Unsalted nuts and seeds. Unsalted canned beans. Dairy Low-fat dairy products, such as skim or 1% milk, 2% or reduced-fat cheeses, low-fat ricotta or cottage cheese, or plain low-fat yogurt. Low-sodium or reduced-sodium cheeses. Fats and Oils Tub margarines without trans fats. Light or reduced-fat mayonnaise and salad dressings (reduced sodium). Avocado. Safflower, olive, or canola oils. Natural peanut or almond butter. Other Unsalted popcorn and pretzels. The items listed above may not be a complete list of recommended foods or beverages. Contact your dietitian for more options. WHAT FOODS ARE NOT RECOMMENDED? Grains White bread. White pasta. White rice. Refined cornbread. Bagels and croissants. Crackers that contain trans fat. Vegetables Creamed or fried vegetables. Vegetables in a cheese sauce. Regular canned vegetables. Regular canned tomato sauce and paste. Regular tomato and vegetable juices. Fruits Dried fruits. Canned fruit in light or heavy syrup. Fruit juice. Meat and Other Protein Products Fatty cuts of meat. Ribs, chicken wings, bacon, sausage, bologna, salami, chitterlings, fatback, hot dogs, bratwurst, and packaged luncheon meats. Salted nuts and seeds. Canned beans with salt. Dairy Whole or 2% milk, cream, half-and-half, and cream cheese. Whole-fat or sweetened yogurt. Full-fat   cheeses or blue cheese. Nondairy creamers and whipped toppings. Processed cheese, cheese spreads, or cheese  curds. Condiments Onion and garlic salt, seasoned salt, table salt, and sea salt. Canned and packaged gravies. Worcestershire sauce. Tartar sauce. Barbecue sauce. Teriyaki sauce. Soy sauce, including reduced sodium. Steak sauce. Fish sauce. Oyster sauce. Cocktail sauce. Horseradish. Ketchup and mustard. Meat flavorings and tenderizers. Bouillon cubes. Hot sauce. Tabasco sauce. Marinades. Taco seasonings. Relishes. Fats and Oils Butter, stick margarine, lard, shortening, ghee, and bacon fat. Coconut, palm kernel, or palm oils. Regular salad dressings. Other Pickles and olives. Salted popcorn and pretzels. The items listed above may not be a complete list of foods and beverages to avoid. Contact your dietitian for more information. WHERE CAN I FIND MORE INFORMATION? National Heart, [employer], [employer]: www.nhlbi.nih.gov/health/health-topics/topics/dash/ Document Released: 01/08/2011 Document Revised: 06/05/2013 Document Reviewed: 11/23/2012 ExitCare Patient Information 2015 [employer]. This information is not intended to replace advice given to you by your health care provider. Make sure you discuss any questions you have with your health care provider. Diabetes and Exercise Exercising regularly is important. It is not just about losing weight. It has many health benefits, such as:  Improving your overall fitness, flexibility, and endurance.  Increasing your bone density.  Helping with weight control.  Decreasing your body fat.  Increasing your muscle strength.  Reducing stress and tension.  Improving your overall health. People with diabetes who exercise gain additional benefits because exercise:  Reduces appetite.  Improves the body's use of blood sugar (glucose).  Helps lower or control blood glucose.  Decreases blood pressure.  Helps control blood lipids (such as cholesterol and triglycerides).  Improves the body's use of the hormone insulin by:  Increasing the  body's insulin sensitivity.  Reducing the body's insulin needs.  Decreases the risk for heart disease because exercising:  Lowers cholesterol and triglycerides levels.  Increases the levels of good cholesterol (such as high-density lipoproteins [HDL]) in the body.  Lowers blood glucose levels. YOUR ACTIVITY PLAN  Choose an activity that you enjoy and set realistic goals. Your health care provider or diabetes educator can help you make an activity plan that works for you. Exercise regularly as directed by your health care provider. This includes:  Performing resistance training twice a week such as push-ups, sit-ups, lifting weights, or using resistance bands.  Performing 150 minutes of cardio exercises each week such as walking, running, or playing sports.  Staying active and spending no more than 90 minutes at one time being inactive. Even short bursts of exercise are good for you. Three 10-minute sessions spread throughout the day are just as beneficial as a single 30-minute session. Some exercise ideas include:  Taking the dog for a walk.  Taking the stairs instead of the elevator.  Dancing to your favorite song.  Doing an exercise video.  Doing your favorite exercise with a friend. RECOMMENDATIONS FOR EXERCISING WITH TYPE 1 OR TYPE 2 DIABETES   Check your blood glucose before exercising. If blood glucose levels are greater than 240 mg/dL, check for urine ketones. Do not exercise if ketones are present.  Avoid injecting insulin into areas of the body that are going to be exercised. For example, avoid injecting insulin into:  The arms when playing tennis.  The legs when jogging.  Keep a record of:  Food intake before and after you exercise.  Expected peak times of insulin action.  Blood glucose levels before and after you exercise.  The type and amount of exercise   you have done.  Review your records with your health care provider. Your health care provider will  help you to develop guidelines for adjusting food intake and insulin amounts before and after exercising.  If you take insulin or oral hypoglycemic agents, watch for signs and symptoms of hypoglycemia. They include:  Dizziness.  Shaking.  Sweating.  Chills.  Confusion.  Drink plenty of water while you exercise to prevent dehydration or heat stroke. Body water is lost during exercise and must be replaced.  Talk to your health care provider before starting an exercise program to make sure it is safe for you. Remember, almost any type of activity is better than none. Document Released: 04/11/2003 Document Revised: 06/05/2013 Document Reviewed: 06/28/2012 ExitCare Patient Information 2015 [employer]. This information is not intended to replace advice given to you by your health care provider. Make sure you discuss any questions you have with your health care provider. Basic Carbohydrate Counting for Diabetes Mellitus Carbohydrate counting is a method for keeping track of the amount of carbohydrates you eat. Eating carbohydrates naturally increases the level of sugar (glucose) in your blood, so it is important for you to know the amount that is okay for you to have in every meal. Carbohydrate counting helps keep the level of glucose in your blood within normal limits. The amount of carbohydrates allowed is different for every person. A dietitian can help you calculate the amount that is right for you. Once you know the amount of carbohydrates you can have, you can count the carbohydrates in the foods you want to eat. Carbohydrates are found in the following foods:  Grains, such as breads and cereals.  Dried beans and soy products.  Starchy vegetables, such as potatoes, peas, and corn.  Fruit and fruit juices.  Milk and yogurt.  Sweets and snack foods, such as cake, cookies, candy, chips, soft drinks, and fruit drinks. CARBOHYDRATE COUNTING There are two ways to count the  carbohydrates in your food. You can use either of the methods or a combination of both. Reading the "Nutrition Facts" on Packaged Food The "Nutrition Facts" is an area that is included on the labels of almost all packaged food and beverages in the [place]. It includes the serving size of that food or beverage and information about the nutrients in each serving of the food, including the grams (g) of carbohydrate per serving.  Decide the number of servings of this food or beverage that you will be able to eat or drink. Multiply that number of servings by the number of grams of carbohydrate that is listed on the label for that serving. The total will be the amount of carbohydrates you will be having when you eat or drink this food or beverage. Learning Standard Serving Sizes of Food When you eat food that is not packaged or does not include "Nutrition Facts" on the label, you need to measure the servings in order to count the amount of carbohydrates.A serving of most carbohydrate-rich foods contains about 15 g of carbohydrates. The following list includes serving sizes of carbohydrate-rich foods that provide 15 g ofcarbohydrate per serving:   1 slice of bread (1 oz) or 1 six-inch tortilla.    of a hamburger bun or English muffin.  4-6 crackers.   cup unsweetened dry cereal.    cup hot cereal.   cup rice or pasta.    cup mashed potatoes or  of a large baked potato.  1 cup fresh fruit or one   small piece of fruit.    cup canned or frozen fruit or fruit juice.  1 cup milk.   cup plain fat-free yogurt or yogurt sweetened with artificial sweeteners.   cup cooked dried beans or starchy vegetable, such as peas, corn, or potatoes.  Decide the number of standard-size servings that you will eat. Multiply that number of servings by 15 (the grams of carbohydrates in that serving). For example, if you eat 2 cups of strawberries, you will have eaten 2 servings and 30 g of  carbohydrates (2 servings x 15 g = 30 g). For foods such as soups and casseroles, in which more than one food is mixed in, you will need to count the carbohydrates in each food that is included. EXAMPLE OF CARBOHYDRATE COUNTING Sample Dinner  3 oz chicken breast.   cup of brown rice.   cup of corn.  1 cup milk.   1 cup strawberries with sugar-free whipped topping.  Carbohydrate Calculation Step 1: Identify the foods that contain carbohydrates:   Rice.   Corn.   Milk.   Strawberries. Step 2:Calculate the number of servings eaten of each:   2 servings of rice.   1 serving of corn.   1 serving of milk.   1 serving of strawberries. Step 3: Multiply each of those number of servings by 15 g:   2 servings of rice x 15 g = 30 g.   1 serving of corn x 15 g = 15 g.   1 serving of milk x 15 g = 15 g.   1 serving of strawberries x 15 g = 15 g. Step 4: Add together all of the amounts to find the total grams of carbohydrates eaten: 30 g + 15 g + 15 g + 15 g = 75 g. Document Released: 01/19/2005 Document Revised: 06/05/2013 Document Reviewed: 12/16/2012 ExitCare Patient Information 2015 [employer]. This information is not intended to replace advice given to you by your health care provider. Make sure you discuss any questions you have with your health care provider.  

## 2014-01-01 NOTE — Progress Notes (Signed)
Pt is here following up on her diabetes and HTN. Pt was recently in the ED with an abscess on her neck. Pt has been out of her test strips for over a month.

## 2014-02-11 ENCOUNTER — Emergency Department (HOSPITAL_COMMUNITY)
Admission: EM | Admit: 2014-02-11 | Discharge: 2014-02-11 | Disposition: A | Discharge status: Home / Self Care | Payer: No Typology Code available for payment source | Attending: Emergency Medicine | Admitting: Emergency Medicine

## 2014-02-11 ENCOUNTER — Encounter (HOSPITAL_COMMUNITY): Payer: Self-pay

## 2014-02-11 DIAGNOSIS — Z791 Long term (current) use of non-steroidal anti-inflammatories (NSAID): Secondary | ICD-10-CM | POA: Insufficient documentation

## 2014-02-11 DIAGNOSIS — E119 Type 2 diabetes mellitus without complications: Secondary | ICD-10-CM | POA: Insufficient documentation

## 2014-02-11 DIAGNOSIS — K219 Gastro-esophageal reflux disease without esophagitis: Secondary | ICD-10-CM | POA: Insufficient documentation

## 2014-02-11 DIAGNOSIS — I10 Essential (primary) hypertension: Secondary | ICD-10-CM | POA: Insufficient documentation

## 2014-02-11 DIAGNOSIS — Z87891 Personal history of nicotine dependence: Secondary | ICD-10-CM | POA: Insufficient documentation

## 2014-02-11 DIAGNOSIS — E785 Hyperlipidemia, unspecified: Secondary | ICD-10-CM | POA: Insufficient documentation

## 2014-02-11 DIAGNOSIS — Z79899 Other long term (current) drug therapy: Secondary | ICD-10-CM | POA: Insufficient documentation

## 2014-02-11 DIAGNOSIS — J449 Chronic obstructive pulmonary disease, unspecified: Secondary | ICD-10-CM | POA: Insufficient documentation

## 2014-02-11 DIAGNOSIS — H00014 Hordeolum externum left upper eyelid: Secondary | ICD-10-CM | POA: Insufficient documentation

## 2014-02-11 DIAGNOSIS — Z7951 Long term (current) use of inhaled steroids: Secondary | ICD-10-CM | POA: Insufficient documentation

## 2014-02-11 DIAGNOSIS — M1990 Unspecified osteoarthritis, unspecified site: Secondary | ICD-10-CM | POA: Insufficient documentation

## 2014-02-11 DIAGNOSIS — H00016 Hordeolum externum left eye, unspecified eyelid: Secondary | ICD-10-CM

## 2014-02-11 MED ORDER — DOXYCYCLINE HYCLATE 100 MG PO CAPS: 100.0000 mg | ORAL_CAPSULE | Freq: Two times a day (BID) | ORAL | Status: DC

## 2014-02-11 NOTE — ED Provider Notes (Signed)
CSN: [id number]     Arrival date & time 02/11/14  1048 History   First MD Initiated Contact with Patient 02/11/14 1058     Chief Complaint  Patient presents with  . Facial Swelling     (Consider location/radiation/quality/duration/timing/severity/associated sxs/prior Treatment) HPI Comments: Patient presents swelling to the left eyelid. She states it started yesterday and got worse today. She denies any vision changes. She's had a little bit of watery drainage from the eye. She denies any pain in the eye itself, it's mostly just to the upper eyelid. She denies any other facial pain or swelling. She denies any headache. There is no fevers or chills.   Past Medical History  Diagnosis Date  . Hypertension     Pt on lisinopril  . GERD (gastroesophageal reflux disease)     Pt on Protonix daily  . Arthritis     Takes Naprosyn   . Diabetes mellitus     Takes Metformin 750 mg  . Hyperlipidemia   . Asthma   . COPD (chronic obstructive pulmonary disease)   . Shortness of breath     occasional - uses breathing tx at home  . Headache(784.0)     otc meds prn  . Irritable bowel syndrome 11/19/2010   Past Surgical History  Procedure Laterality Date  . Hernia repair    . Cholecystectomy    . Svd       x 2  . Tubal ligation    . Endometrial ablation  10/2010  . Upper gastrointestinal endoscopy  04/28/11   Family History  Problem Relation Age of Onset  . Asthma Son     had as a child  . Heart disease Sister   . Breast cancer Sister   . Hypertension Father   . Cancer Father   . Hypertension Brother    History  Substance Use Topics  . Smoking status: Former Smoker -- 1.00 packs/day for 10 years    Types: Cigarettes    Quit date: 09/28/2000  . Smokeless tobacco: Never Used  . Alcohol Use: No   OB History    No data available     Review of Systems  Constitutional: Negative for fever, chills, diaphoresis and fatigue.  HENT: Negative for congestion, rhinorrhea and sneezing.    Eyes: Positive for discharge and redness. Negative for photophobia, pain and visual disturbance.  Respiratory: Negative for cough, chest tightness and shortness of breath.   Cardiovascular: Negative for chest pain and leg swelling.  Gastrointestinal: Negative for nausea, vomiting, abdominal pain, diarrhea and blood in stool.  Genitourinary: Negative for frequency, hematuria, flank pain and difficulty urinating.  Musculoskeletal: Negative for back pain and arthralgias.  Skin: Negative for rash.  Neurological: Negative for dizziness, speech difficulty, weakness, numbness and headaches.      Allergies  Other; Aspirin; and Ace inhibitors  Home Medications   Prior to Admission medications   Medication Sig Start Date End Date Taking? Authorizing Provider  albuterol (PROVENTIL) (2.5 MG/3ML) 0.083% nebulizer solution Take 3 mLs (2.5 mg total) by nebulization every 4 (four) hours as needed for wheezing. 05/15/13  Yes Tresa Garter, MD  allopurinol (ZYLOPRIM) 100 MG tablet Take 1 tablet (100 mg total) by mouth daily. 01/01/14  Yes Tresa Garter, MD  budesonide-formoterol (SYMBICORT) 80-4.5 MCG/ACT inhaler Inhale 2 puffs into the lungs 2 (two) times daily. 08/14/13 08/14/14 Yes Olugbemiga Essie Christine, MD  colchicine 0.6 MG tablet Take 1 tablet (0.6 mg total) by mouth daily. 08/14/13  Yes Olugbemiga  Annitta Needs, MD  cyclobenzaprine (FLEXERIL) 10 MG tablet Take 1 tablet (10 mg total) by mouth at bedtime. 08/14/13  Yes Quentin Angst, MD  EPINEPHrine (EPI-PEN) 0.3 mg/0.3 mL DEVI Inject 0.3 mg into the muscle once.   Yes Historical Provider, MD  gabapentin (NEURONTIN) 300 MG capsule Take 1 capsule (300 mg total) by mouth 3 (three) times daily. 01/01/14  Yes Quentin Angst, MD  glucose monitoring kit (FREESTYLE) monitoring kit 1 each by Does not apply route as needed for other. 01/01/14  Yes Quentin Angst, MD  hydrOXYzine (ATARAX/VISTARIL) 10 MG tablet Take 1 tablet (10 mg total) by  mouth 3 (three) times daily as needed. 02/16/13  Yes Doris Cheadle, MD  Lancets (FREESTYLE) lancets Use as instructed 05/15/13  Yes Olugbemiga E Hyman Hopes, MD  meloxicam (MOBIC) 15 MG tablet Take 1 tablet (15 mg total) by mouth daily. 05/30/13  Yes Daine Gip, MD  metFORMIN (GLUCOPHAGE-XR) 750 MG 24 hr tablet Take 1 tablet (750 mg total) by mouth daily with breakfast. 01/01/14  Yes Quentin Angst, MD  oxyCODONE-acetaminophen (PERCOCET/ROXICET) 5-325 MG per tablet Take 1 tablet by mouth every 4 (four) hours as needed for severe pain. 12/12/13  Yes Raeford Razor, MD  pantoprazole (PROTONIX) 40 MG tablet Take 1 tablet (40 mg total) by mouth daily. 01/01/14  Yes Quentin Angst, MD  Tuberculin-Allergy Syringes (ALLERGY SYRINGE 1CC/28GX1/2") 28G X 1/2" 1 ML MISC As directed by allergist 02/16/13  Yes Doris Cheadle, MD  valsartan-hydrochlorothiazide (DIOVAN-HCT) 160-12.5 MG per tablet Take 1 tablet by mouth daily. 01/01/14  Yes Quentin Angst, MD  Vitamin D, Ergocalciferol, (DRISDOL) 50000 UNITS CAPS capsule Take 1 capsule (50,000 Units total) by mouth every 7 (seven) days. Patient taking differently: Take 50,000 Units by mouth every 7 (seven) days. Takes on [facility] 02/17/13  Yes Deepak Advani, MD  acetaminophen-codeine (TYLENOL #3) 300-30 MG per tablet Take 1 tablet by mouth every 4 (four) hours as needed. Patient not taking: Reported on 02/11/2014 01/01/14   Quentin Angst, MD  doxycycline (VIBRAMYCIN) 100 MG capsule Take 1 capsule (100 mg total) by mouth 2 (two) times daily. One po bid x 7 days 02/11/14   Rolan Bucco, MD  fluconazole (DIFLUCAN) 200 MG tablet Take 1 tablet (200 mg total) by mouth daily. Patient not taking: Reported on 01/01/2014 08/16/13   Quentin Angst, MD  metoCLOPramide (REGLAN) 5 MG tablet Take 1 tablet (5 mg total) by mouth every 8 (eight) hours as needed for nausea or vomiting. Patient not taking: Reported on 02/11/2014 05/15/13   Quentin Angst, MD   ondansetron (ZOFRAN) 4 MG tablet Take 1 tablet (4 mg total) by mouth every 6 (six) hours as needed for nausea or vomiting. Patient not taking: Reported on 01/01/2014 01/14/13   Audree Camel, MD  sulfamethoxazole-trimethoprim (SEPTRA DS) 800-160 MG per tablet Take 1 tablet by mouth every 12 (twelve) hours. Patient not taking: Reported on 01/01/2014 12/12/13   Raeford Razor, MD   BP 173/101 mmHg  Pulse 76  Temp(Src) 98.6 F (37 C) (Oral)  Resp 18  SpO2 100% Physical Exam  Constitutional: She is oriented to person, place, and time. She appears well-developed and well-nourished.  HENT:  Head: Normocephalic and atraumatic.  Eyes: Pupils are equal, round, and reactive to light.  Patient has normal conjunctiva. She has a small stye to the central upper eyelid with some diffuse swelling of the upper eyelid. There is tenderness around the stye. There is no pain  on range of motion of the eye. There's no other periorbital swelling.  Neck: Normal range of motion. Neck supple.  Cardiovascular: Normal rate, regular rhythm and normal heart sounds.   Pulmonary/Chest: Effort normal and breath sounds normal. No respiratory distress. She has no wheezes. She has no rales. She exhibits no tenderness.  Abdominal: Soft. Bowel sounds are normal. There is no tenderness. There is no rebound and no guarding.  Musculoskeletal: Normal range of motion. She exhibits no edema.  Lymphadenopathy:    She has no cervical adenopathy.  Neurological: She is alert and oriented to person, place, and time.  Skin: Skin is warm and dry. No rash noted.  Psychiatric: She has a normal mood and affect.    ED Course  Procedures (including critical care time) Labs Review Labs Reviewed - No data to display  Imaging Review No results found.   EKG Interpretation None      MDM   Final diagnoses:  Stye, left    Patient has unchanged position. There is a small stye with some surrounding swelling but no evidence of  orbital or periorbital cellulitis. There is some localized inflammation. I advised her to use hot compresses and I will go ahead and start her on doxycycline. She has a history diabetes and I advised her to keep a close eye on her blood sugars. She states today that they've been in the 120 to 130 range. Her blood pressure is elevated and I advised her to have her primary care physician follow-up on this. I gave her referral to ophthalmology. I advised her to make an appointment with ophthalmology if her eye is not better in the next few days. I advised her return here if she has any worsening pain or swelling.    Malvin Johns, MD 02/11/14 1257

## 2014-02-11 NOTE — Discharge Instructions (Signed)

## 2014-02-11 NOTE — ED Notes (Signed)
Per pt, woke up yesterday with pain and swelling in left eye.  Today pain/swelling getting worse.  Some blurriness noted.  Drainage present this morning.

## 2014-02-12 ENCOUNTER — Encounter: Payer: Self-pay | Admitting: Internal Medicine

## 2014-03-09 ENCOUNTER — Ambulatory Visit: Payer: Self-pay | Attending: Internal Medicine

## 2014-03-27 ENCOUNTER — Emergency Department (HOSPITAL_COMMUNITY)
Admission: EM | Admit: 2014-03-27 | Discharge: 2014-03-27 | Disposition: A | Discharge status: Home / Self Care | Payer: Self-pay | Attending: Emergency Medicine | Admitting: Emergency Medicine

## 2014-03-27 ENCOUNTER — Encounter (HOSPITAL_COMMUNITY): Payer: Self-pay

## 2014-03-27 DIAGNOSIS — K219 Gastro-esophageal reflux disease without esophagitis: Secondary | ICD-10-CM | POA: Insufficient documentation

## 2014-03-27 DIAGNOSIS — Y9389 Activity, other specified: Secondary | ICD-10-CM | POA: Insufficient documentation

## 2014-03-27 DIAGNOSIS — Y9289 Other specified places as the place of occurrence of the external cause: Secondary | ICD-10-CM | POA: Insufficient documentation

## 2014-03-27 DIAGNOSIS — I10 Essential (primary) hypertension: Secondary | ICD-10-CM | POA: Insufficient documentation

## 2014-03-27 DIAGNOSIS — T7840XA Allergy, unspecified, initial encounter: Secondary | ICD-10-CM | POA: Insufficient documentation

## 2014-03-27 DIAGNOSIS — Y998 Other external cause status: Secondary | ICD-10-CM | POA: Insufficient documentation

## 2014-03-27 DIAGNOSIS — J449 Chronic obstructive pulmonary disease, unspecified: Secondary | ICD-10-CM | POA: Insufficient documentation

## 2014-03-27 DIAGNOSIS — R21 Rash and other nonspecific skin eruption: Secondary | ICD-10-CM | POA: Insufficient documentation

## 2014-03-27 DIAGNOSIS — Z791 Long term (current) use of non-steroidal anti-inflammatories (NSAID): Secondary | ICD-10-CM | POA: Insufficient documentation

## 2014-03-27 DIAGNOSIS — Z87891 Personal history of nicotine dependence: Secondary | ICD-10-CM | POA: Insufficient documentation

## 2014-03-27 DIAGNOSIS — X58XXXA Exposure to other specified factors, initial encounter: Secondary | ICD-10-CM | POA: Insufficient documentation

## 2014-03-27 DIAGNOSIS — M1990 Unspecified osteoarthritis, unspecified site: Secondary | ICD-10-CM | POA: Insufficient documentation

## 2014-03-27 DIAGNOSIS — E785 Hyperlipidemia, unspecified: Secondary | ICD-10-CM | POA: Insufficient documentation

## 2014-03-27 DIAGNOSIS — Z7951 Long term (current) use of inhaled steroids: Secondary | ICD-10-CM | POA: Insufficient documentation

## 2014-03-27 DIAGNOSIS — Z79899 Other long term (current) drug therapy: Secondary | ICD-10-CM | POA: Insufficient documentation

## 2014-03-27 DIAGNOSIS — E119 Type 2 diabetes mellitus without complications: Secondary | ICD-10-CM | POA: Insufficient documentation

## 2014-03-27 MED ORDER — DIPHENHYDRAMINE HCL 25 MG PO CAPS
50.0000 mg | ORAL_CAPSULE | Freq: Once | ORAL | Status: AC
  Administered 2014-03-27: 50 mg via ORAL
  Filled 2014-03-27: qty 2

## 2014-03-27 MED ORDER — DIPHENHYDRAMINE HCL 25 MG PO TABS: 25.0000 mg | ORAL_TABLET | Freq: Four times a day (QID) | ORAL | Status: DC

## 2014-03-27 MED ORDER — RANITIDINE HCL 150 MG PO TABS: 150.0000 mg | ORAL_TABLET | Freq: Two times a day (BID) | ORAL | Status: DC

## 2014-03-27 NOTE — ED Notes (Signed)
Pt c/o rash starting this afternoon.  Pt reports "I was laying on the couch and when I got up, I was itching all over."  Old sores/wounds on bilateral arms.  Unsure of new lotions, soaps, or detergents.

## 2014-03-27 NOTE — Discharge Instructions (Signed)
Allergies  Allergies may happen from anything your body is sensitive to. This may be food, medicines, pollens, chemicals, and many other things. Food allergies can be severe and deadly.  HOME CARE  If you do not know what causes a reaction, keep a diary. Write down the foods you ate and the symptoms that followed. Avoid foods that cause reactions.  If you have red raised spots (hives) or a rash:  Take medicine as told by your doctor.  Use medicines for red raised spots and itching as needed.  Apply cold cloths (compresses) to the skin. Take a cool bath. Avoid hot baths or showers.  If you are severely allergic:  It is often necessary to go to the hospital after you have treated your reaction.  Wear your medical alert jewelry.  You and your family must learn how to give a allergy shot or use an allergy kit (anaphylaxis kit).  Always carry your allergy kit or shot with you. Use this medicine as told by your doctor if a severe reaction is occurring. GET HELP RIGHT AWAY IF:  You have trouble breathing or are making high-pitched whistling sounds (wheezing).  You have a tight feeling in your chest or throat.  You have a puffy (swollen) mouth.  You have red raised spots, puffiness (swelling), or itching all over your body.  You have had a severe reaction that was helped by your allergy kit or shot. The reaction can return once the medicine has worn off.  You think you are having a food allergy. Symptoms most often happen within 30 minutes of eating a food.  Your symptoms have not gone away within 2 days or are getting worse.  You have new symptoms.  You want to retest yourself with a food or drink you think causes an allergic reaction. Only do this under the care of a doctor. MAKE SURE YOU:   Understand these instructions.  Will watch your condition.  Will get help right away if you are not doing well or get worse. Document Released: 05/16/2012 Document Reviewed:  05/16/2012 [facility] Patient Information 2015 [place]. This information is not intended to replace advice given to you by your health care provider. Make sure you discuss any questions you have with your health care provider.   It is important to follow up with your primary care for further evaluation and management of your symptoms. You may also want to stop using the all detergent and switch back to the formula you used previously. Return to ED for new or worsening symptoms. Please take medications as directed to help with itching he may experience.

## 2014-03-27 NOTE — ED Provider Notes (Signed)
CSN: [id number]     Arrival date & time 03/27/14  1455 History  This chart was scribed for non-physician practitioner, Comer Locket, PA-C, working with Debby Freiberg, MD by Ladene Artist, ED Scribe. This patient was seen in room WTR8/WTR8 and the patient's care was started at 3:26 PM.   Chief Complaint  Patient presents with  . Allergic Reaction   The history is provided by the patient. No language interpreter was used.   HPI Comments: Stacy [name] is a 54 y.o. female, with a h/o HTN, DM, hyperlipidemia, asthma, COPD, who presents to the Emergency Department complaining of sudden onset of rash to bilateral arms, legs and back onset this afternoon. Pt states that she was lying on her couch when she initially noticed the itchy rash. She reports recently switching laundry detergent to ALL. She denies change in perfume, deodorant, bugs, pets. Pt also denies SOB, abdominal pain, nausea, vomiting. No treatments tried PTA. Allergy to ASA.  No other modifying factors  Past Medical History  Diagnosis Date  . Hypertension     Pt on lisinopril  . GERD (gastroesophageal reflux disease)     Pt on Protonix daily  . Arthritis     Takes Naprosyn   . Diabetes mellitus     Takes Metformin 750 mg  . Hyperlipidemia   . Asthma   . COPD (chronic obstructive pulmonary disease)   . Shortness of breath     occasional - uses breathing tx at home  . Headache(784.0)     otc meds prn  . Irritable bowel syndrome 11/19/2010   Past Surgical History  Procedure Laterality Date  . Hernia repair    . Cholecystectomy    . Svd       x 2  . Tubal ligation    . Endometrial ablation  10/2010  . Upper gastrointestinal endoscopy  04/28/11   Family History  Problem Relation Age of Onset  . Asthma Son     had as a child  . Heart disease Sister   . Breast cancer Sister   . Hypertension Father   . Cancer Father   . Hypertension Brother    History  Substance Use Topics  . Smoking status: Former Smoker --  1.00 packs/day for 10 years    Types: Cigarettes    Quit date: 09/28/2000  . Smokeless tobacco: Never Used  . Alcohol Use: No   OB History    No data available     Review of Systems  Constitutional: Negative for fever.  Respiratory: Negative for shortness of breath.   Cardiovascular: Negative for chest pain.  Gastrointestinal: Negative for nausea, vomiting and abdominal pain.  Skin: Positive for rash.   Allergies  Other; Aspirin; and Ace inhibitors  Home Medications   Prior to Admission medications   Medication Sig Start Date End Date Taking? Authorizing Provider  acetaminophen-codeine (TYLENOL #3) 300-30 MG per tablet Take 1 tablet by mouth every 4 (four) hours as needed. Patient not taking: Reported on 02/11/2014 01/01/14   Tresa Garter, MD  albuterol (PROVENTIL) (2.5 MG/3ML) 0.083% nebulizer solution Take 3 mLs (2.5 mg total) by nebulization every 4 (four) hours as needed for wheezing. 05/15/13   Tresa Garter, MD  allopurinol (ZYLOPRIM) 100 MG tablet Take 1 tablet (100 mg total) by mouth daily. 01/01/14   Tresa Garter, MD  budesonide-formoterol (SYMBICORT) 80-4.5 MCG/ACT inhaler Inhale 2 puffs into the lungs 2 (two) times daily. 08/14/13 08/14/14  Tresa Garter, MD  colchicine  0.6 MG tablet Take 1 tablet (0.6 mg total) by mouth daily. 08/14/13   Tresa Garter, MD  cyclobenzaprine (FLEXERIL) 10 MG tablet Take 1 tablet (10 mg total) by mouth at bedtime. 08/14/13   Tresa Garter, MD  diphenhydrAMINE (BENADRYL) 25 MG tablet Take 1 tablet (25 mg total) by mouth every 6 (six) hours. 03/27/14   Verl Dicker, PA-C  doxycycline (VIBRAMYCIN) 100 MG capsule Take 1 capsule (100 mg total) by mouth 2 (two) times daily. One po bid x 7 days 02/11/14   Malvin Johns, MD  EPINEPHrine (EPI-PEN) 0.3 mg/0.3 mL DEVI Inject 0.3 mg into the muscle once.    Historical Provider, MD  fluconazole (DIFLUCAN) 200 MG tablet Take 1 tablet (200 mg total) by mouth  daily. Patient not taking: Reported on 01/01/2014 08/16/13   Tresa Garter, MD  gabapentin (NEURONTIN) 300 MG capsule Take 1 capsule (300 mg total) by mouth 3 (three) times daily. 01/01/14   Tresa Garter, MD  glucose monitoring kit (FREESTYLE) monitoring kit 1 each by Does not apply route as needed for other. 01/01/14   Tresa Garter, MD  hydrOXYzine (ATARAX/VISTARIL) 10 MG tablet Take 1 tablet (10 mg total) by mouth 3 (three) times daily as needed. 02/16/13   Lorayne Marek, MD  Lancets (FREESTYLE) lancets Use as instructed 05/15/13   Tresa Garter, MD  meloxicam (MOBIC) 15 MG tablet Take 1 tablet (15 mg total) by mouth daily. 05/30/13   Duane Boston, MD  metFORMIN (GLUCOPHAGE-XR) 750 MG 24 hr tablet Take 1 tablet (750 mg total) by mouth daily with breakfast. 01/01/14   Tresa Garter, MD  metoCLOPramide (REGLAN) 5 MG tablet Take 1 tablet (5 mg total) by mouth every 8 (eight) hours as needed for nausea or vomiting. Patient not taking: Reported on 02/11/2014 05/15/13   Tresa Garter, MD  ondansetron (ZOFRAN) 4 MG tablet Take 1 tablet (4 mg total) by mouth every 6 (six) hours as needed for nausea or vomiting. Patient not taking: Reported on 01/01/2014 01/14/13   Ephraim Hamburger, MD  oxyCODONE-acetaminophen (PERCOCET/ROXICET) 5-325 MG per tablet Take 1 tablet by mouth every 4 (four) hours as needed for severe pain. 12/12/13   Virgel Manifold, MD  pantoprazole (PROTONIX) 40 MG tablet Take 1 tablet (40 mg total) by mouth daily. 01/01/14   Tresa Garter, MD  ranitidine (ZANTAC) 150 MG tablet Take 1 tablet (150 mg total) by mouth 2 (two) times daily. 03/27/14   Viona Gilmore Dulcinea Kinser, PA-C  sulfamethoxazole-trimethoprim (SEPTRA DS) 800-160 MG per tablet Take 1 tablet by mouth every 12 (twelve) hours. Patient not taking: Reported on 01/01/2014 12/12/13   Virgel Manifold, MD  Tuberculin-Allergy Syringes (ALLERGY SYRINGE 1CC/28GX1/2") 28G X 1/2" 1 ML MISC As directed by allergist  02/16/13   Lorayne Marek, MD  valsartan-hydrochlorothiazide (DIOVAN-HCT) 160-12.5 MG per tablet Take 1 tablet by mouth daily. 01/01/14   Tresa Garter, MD  Vitamin D, Ergocalciferol, (DRISDOL) 50000 UNITS CAPS capsule Take 1 capsule (50,000 Units total) by mouth every 7 (seven) days. Patient taking differently: Take 50,000 Units by mouth every 7 (seven) days. Takes on [facility] 02/17/13   Deepak Advani, MD   BP 159/81 mmHg  Pulse 63  Temp(Src) 98 F (36.7 C) (Oral)  Resp 16  SpO2 100% Physical Exam  Constitutional: She is oriented to person, place, and time. She appears well-developed and well-nourished. No distress.  HENT:  Head: Normocephalic and atraumatic.  Eyes: Conjunctivae and EOM are normal.  Neck: Neck supple. No tracheal deviation present.  Cardiovascular: Normal rate, regular rhythm and normal heart sounds.   Pulmonary/Chest: Effort normal and breath sounds normal. No respiratory distress.  Abdominal: Soft. There is no tenderness.  Musculoskeletal: Normal range of motion.  Neurological: She is alert and oriented to person, place, and time.  Skin: Skin is warm and dry.  Multiple excoriations with scabs/sores in multiple stages of healing along bilateral UE. No burrows in webs of fingers.  No other lesions or rashes noted.  No rash noted to neck  Psychiatric: She has a normal mood and affect. Her behavior is normal.  Nursing note and vitals reviewed.  ED Course  Procedures (including critical care time) DIAGNOSTIC STUDIES: Oxygen Saturation is 100% on RA, normal by my interpretation.    COORDINATION OF CARE: 3:31 PM-Discussed treatment plan which includes Zantac and Benadryl with pt at bedside and pt agreed to plan.   Labs Review Labs Reviewed - No data to display  Imaging Review No results found.   EKG Interpretation None     Meds given in ED:  Medications  diphenhydrAMINE (BENADRYL) capsule 50 mg (50 mg Oral Given 03/27/14 1550)    Discharge Medication  List as of 03/27/2014  3:48 PM    START taking these medications   Details  diphenhydrAMINE (BENADRYL) 25 MG tablet Take 1 tablet (25 mg total) by mouth every 6 (six) hours., Starting 03/27/2014, Until Discontinued, Print    ranitidine (ZANTAC) 150 MG tablet Take 1 tablet (150 mg total) by mouth 2 (two) times daily., Starting 03/27/2014, Until Discontinued, Print       Filed Vitals:   03/27/14 1459  BP: 159/81  Pulse: 63  Temp: 98 F (36.7 C)  TempSrc: Oral  Resp: 16  SpO2: 100%    MDM  Vitals stable - WNL -afebrile Pt resting comfortably in ED. PE--not concerning further acute or emergent pathology. No evidence of scabies.  No evidence of anaphylaxis  Will DC with Benadryl, Zantac, instructions to discontinue use of new laundry detergent.   May use hydrocortisone cream at home. I discussed all relevant lab findings and imaging results with pt and they verbalized understanding. Discussed f/u with PCP within 48 hrs and return precautions, pt very amenable to plan.  Final diagnoses:  Allergic reaction, initial encounter    I personally performed the services described in this documentation, which was scribed in my presence. The recorded information has been reviewed and is accurate.    Viona Gilmore Myrtle, PA-C 03/27/14 2123  Debby Freiberg, MD 03/29/14 1101

## 2014-04-11 ENCOUNTER — Other Ambulatory Visit: Payer: Self-pay | Admitting: Internal Medicine

## 2014-06-30 ENCOUNTER — Emergency Department (HOSPITAL_COMMUNITY)
Admission: EM | Admit: 2014-06-30 | Discharge: 2014-07-01 | Disposition: A | Discharge status: Home / Self Care | Payer: Self-pay | Attending: Emergency Medicine | Admitting: Emergency Medicine

## 2014-06-30 ENCOUNTER — Encounter (HOSPITAL_COMMUNITY): Payer: Self-pay | Admitting: Emergency Medicine

## 2014-06-30 DIAGNOSIS — M62831 Muscle spasm of calf: Secondary | ICD-10-CM | POA: Insufficient documentation

## 2014-06-30 DIAGNOSIS — K219 Gastro-esophageal reflux disease without esophagitis: Secondary | ICD-10-CM | POA: Insufficient documentation

## 2014-06-30 DIAGNOSIS — Z79899 Other long term (current) drug therapy: Secondary | ICD-10-CM | POA: Insufficient documentation

## 2014-06-30 DIAGNOSIS — E119 Type 2 diabetes mellitus without complications: Secondary | ICD-10-CM | POA: Insufficient documentation

## 2014-06-30 DIAGNOSIS — E860 Dehydration: Secondary | ICD-10-CM

## 2014-06-30 DIAGNOSIS — I10 Essential (primary) hypertension: Secondary | ICD-10-CM | POA: Insufficient documentation

## 2014-06-30 DIAGNOSIS — J449 Chronic obstructive pulmonary disease, unspecified: Secondary | ICD-10-CM | POA: Insufficient documentation

## 2014-06-30 DIAGNOSIS — M62838 Other muscle spasm: Secondary | ICD-10-CM

## 2014-06-30 DIAGNOSIS — Z87891 Personal history of nicotine dependence: Secondary | ICD-10-CM | POA: Insufficient documentation

## 2014-06-30 DIAGNOSIS — M1990 Unspecified osteoarthritis, unspecified site: Secondary | ICD-10-CM | POA: Insufficient documentation

## 2014-06-30 DIAGNOSIS — Z791 Long term (current) use of non-steroidal anti-inflammatories (NSAID): Secondary | ICD-10-CM | POA: Insufficient documentation

## 2014-06-30 DIAGNOSIS — R748 Abnormal levels of other serum enzymes: Secondary | ICD-10-CM

## 2014-06-30 LAB — COMPREHENSIVE METABOLIC PANEL
ALT: 19 U/L (ref 14–54)
AST: 29 U/L (ref 15–41)
Albumin: 4.8 g/dL (ref 3.5–5.0)
Alkaline Phosphatase: 126 U/L (ref 38–126)
Anion gap: 15 (ref 5–15)
BUN: 15 mg/dL (ref 6–20)
CO2: 19 mmol/L — ABNORMAL LOW (ref 22–32)
Calcium: 10 mg/dL (ref 8.9–10.3)
Chloride: 101 mmol/L (ref 101–111)
Creatinine, Ser: 1.02 mg/dL — ABNORMAL HIGH (ref 0.44–1.00)
GFR calc Af Amer: >60 mL/min (ref >60)
GFR calc non Af Amer: >60 mL/min (ref >60)
Glucose, Bld: 116 mg/dL — ABNORMAL HIGH (ref 65–99)
Potassium: 3.6 mmol/L (ref 3.5–5.1)
Sodium: 135 mmol/L (ref 135–145)
Total Bilirubin: 0.7 mg/dL (ref 0.3–1.2)
Total Protein: 8.6 g/dL — ABNORMAL HIGH (ref 6.5–8.1)

## 2014-06-30 LAB — CBC WITH DIFFERENTIAL/PLATELET
Basophils Absolute: 0 10*3/uL (ref 0.0–0.1)
Basophils Relative: 0 % (ref 0–1)
Eosinophils Absolute: 0.1 10*3/uL (ref 0.0–0.7)
Eosinophils Relative: 1 % (ref 0–5)
HCT: 40.7 % (ref 36.0–46.0)
Hemoglobin: 14 g/dL (ref 12.0–15.0)
Lymphocytes Relative: 51 % — ABNORMAL HIGH (ref 12–46)
Lymphs Abs: 5.1 10*3/uL — ABNORMAL HIGH (ref 0.7–4.0)
MCH: 27.2 pg (ref 26.0–34.0)
MCHC: 34.4 g/dL (ref 30.0–36.0)
MCV: 79 fL (ref 78.0–100.0)
Monocytes Absolute: 0.8 10*3/uL (ref 0.1–1.0)
Monocytes Relative: 8 % (ref 3–12)
Neutro Abs: 3.9 10*3/uL (ref 1.7–7.7)
Neutrophils Relative %: 40 % — ABNORMAL LOW (ref 43–77)
Platelets: 241 10*3/uL (ref 150–400)
RBC: 5.15 MIL/uL — ABNORMAL HIGH (ref 3.87–5.11)
RDW: 13.5 % (ref 11.5–15.5)
WBC: 9.7 10*3/uL (ref 4.0–10.5)

## 2014-06-30 LAB — MAGNESIUM: Magnesium: 2.3 mg/dL (ref 1.7–2.4)

## 2014-06-30 LAB — ETHANOL: Alcohol, Ethyl (B): 20 mg/dL — ABNORMAL HIGH (ref <5)

## 2014-06-30 LAB — CK: Total CK: 713 U/L — ABNORMAL HIGH (ref 38–234)

## 2014-06-30 MED ORDER — DIAZEPAM 5 MG/ML IJ SOLN: 5.0000 mg | Freq: Once | INTRAMUSCULAR | Status: DC

## 2014-06-30 MED ORDER — SODIUM CHLORIDE 0.9 % IV BOLUS (SEPSIS)
1000.0000 mL | Freq: Once | INTRAVENOUS | Status: AC
  Administered 2014-06-30: 1000 mL via INTRAVENOUS

## 2014-06-30 MED ORDER — DIAZEPAM 5 MG/ML IJ SOLN
7.5000 mg | Freq: Once | INTRAMUSCULAR | Status: AC
  Administered 2014-06-30: 7.5 mg via INTRAVENOUS
  Filled 2014-06-30: qty 2

## 2014-06-30 MED ORDER — LORAZEPAM 2 MG/ML IJ SOLN: 1.0000 mg | Freq: Once | INTRAMUSCULAR | Status: DC

## 2014-06-30 NOTE — ED Provider Notes (Signed)
CSN: [id number]     Arrival date & time 06/30/14  2105 History   First MD Initiated Contact with Patient 06/30/14 2107     Chief Complaint  Patient presents with  . Spasms     (Consider location/radiation/quality/duration/timing/severity/associated sxs/prior Treatment) The history is provided by the patient, medical records and a relative. No language interpreter was used.     Stacy [name] is a 54 y.o. female  with a hx of HTN, GERD, arthritis, NIDDM, asthma, COPD, IBS presents to the Emergency Department complaining of sudden, persistent, progressively worsening muscles cramps onset at 8pm beginning in the back of her right leg and extending to her right calf and now her right low back. Patient reports that she spent all day yesterday and today outside in the heat. She reports only moderate by mouth fluid intake. She denies alcohol or drug usage. Associated symptoms include pain in her legs and low back from the cramping.  Laying flat and massage makes it better and movement makes it worse.  Pt denies previous episodes.  Pt denies fever, chills, headache, neck pain, chest pain, shortness of breath, abdominal pain, nausea, vomiting, diarrhea, weakness, dizziness, syncope, dysuria, hematuria.     Past Medical History  Diagnosis Date  . Hypertension     Pt on lisinopril  . GERD (gastroesophageal reflux disease)     Pt on Protonix daily  . Arthritis     Takes Naprosyn   . Diabetes mellitus     Takes Metformin 750 mg  . Hyperlipidemia   . Asthma   . COPD (chronic obstructive pulmonary disease)   . Shortness of breath     occasional - uses breathing tx at home  . Headache(784.0)     otc meds prn  . Irritable bowel syndrome 11/19/2010   Past Surgical History  Procedure Laterality Date  . Hernia repair    . Cholecystectomy    . Svd       x 2  . Tubal ligation    . Endometrial ablation  10/2010  . Upper gastrointestinal endoscopy  04/28/11   Family History  Problem Relation Age  of Onset  . Asthma Son     had as a child  . Heart disease Sister   . Breast cancer Sister   . Hypertension Father   . Cancer Father   . Hypertension Brother    History  Substance Use Topics  . Smoking status: Former Smoker -- 1.00 packs/day for 10 years    Types: Cigarettes    Quit date: 09/28/2000  . Smokeless tobacco: Never Used  . Alcohol Use: No   OB History    No data available     Review of Systems  Constitutional: Negative for fever, diaphoresis, appetite change, fatigue and unexpected weight change.  HENT: Negative for mouth sores.   Eyes: Negative for visual disturbance.  Respiratory: Negative for cough, chest tightness, shortness of breath and wheezing.   Cardiovascular: Negative for chest pain.  Gastrointestinal: Negative for nausea, vomiting, abdominal pain, diarrhea and constipation.  Endocrine: Negative for polydipsia, polyphagia and polyuria.  Genitourinary: Negative for dysuria, urgency, frequency and hematuria.  Musculoskeletal: Negative for back pain and neck stiffness.       Muscle spasms  Skin: Negative for rash.  Allergic/Immunologic: Negative for immunocompromised state.  Neurological: Negative for syncope, light-headedness and headaches.  Hematological: Does not bruise/bleed easily.  Psychiatric/Behavioral: Negative for sleep disturbance. The patient is not nervous/anxious.       Allergies  Other;  Aspirin; and Ace inhibitors  Home Medications   Prior to Admission medications   Medication Sig Start Date End Date Taking? Authorizing Provider  albuterol (PROVENTIL) (2.5 MG/3ML) 0.083% nebulizer solution Take 3 mLs (2.5 mg total) by nebulization every 4 (four) hours as needed for wheezing. 05/15/13  Yes Tresa Garter, MD  allopurinol (ZYLOPRIM) 100 MG tablet Take 1 tablet (100 mg total) by mouth daily. 01/01/14  Yes Tresa Garter, MD  budesonide-formoterol (SYMBICORT) 80-4.5 MCG/ACT inhaler Inhale 2 puffs into the lungs 2 (two) times  daily. 08/14/13 08/14/14 Yes Olugbemiga Essie Christine, MD  colchicine 0.6 MG tablet Take 1 tablet (0.6 mg total) by mouth daily. 08/14/13  Yes Tresa Garter, MD  cyclobenzaprine (FLEXERIL) 10 MG tablet Take 1 tablet (10 mg total) by mouth at bedtime. 08/14/13  Yes Tresa Garter, MD  diphenhydrAMINE (BENADRYL) 25 MG tablet Take 1 tablet (25 mg total) by mouth every 6 (six) hours. Patient taking differently: Take 25 mg by mouth every 6 (six) hours as needed for allergies.  03/27/14  Yes Comer Locket, PA-C  EPINEPHrine (EPI-PEN) 0.3 mg/0.3 mL DEVI Inject 0.3 mg into the muscle once.   Yes Historical Provider, MD  gabapentin (NEURONTIN) 300 MG capsule Take 1 capsule (300 mg total) by mouth 3 (three) times daily. 01/01/14  Yes Tresa Garter, MD  glucose monitoring kit (FREESTYLE) monitoring kit 1 each by Does not apply route as needed for other. 01/01/14  Yes Tresa Garter, MD  hydrOXYzine (ATARAX/VISTARIL) 10 MG tablet Take 1 tablet (10 mg total) by mouth 3 (three) times daily as needed. Patient taking differently: Take 10 mg by mouth 3 (three) times daily as needed for itching or anxiety.  02/16/13  Yes Lorayne Marek, MD  Lancets (FREESTYLE) lancets Use as instructed 05/15/13  Yes Olugbemiga E Doreene Burke, MD  meloxicam (MOBIC) 15 MG tablet Take 1 tablet (15 mg total) by mouth daily. 05/30/13  Yes Duane Boston, MD  metFORMIN (GLUCOPHAGE-XR) 750 MG 24 hr tablet Take 1 tablet (750 mg total) by mouth daily with breakfast. 01/01/14  Yes Olugbemiga E Doreene Burke, MD  pantoprazole (PROTONIX) 40 MG tablet Take 1 tablet (40 mg total) by mouth daily. 01/01/14  Yes Tresa Garter, MD  Tuberculin-Allergy Syringes (ALLERGY SYRINGE 1CC/28GX1/2") 28G X 1/2" 1 ML MISC As directed by allergist 02/16/13  Yes Lorayne Marek, MD  valsartan-hydrochlorothiazide (DIOVAN-HCT) 160-12.5 MG per tablet Take 1 tablet by mouth daily. 01/01/14  Yes Tresa Garter, MD  Vitamin D, Ergocalciferol, (DRISDOL) 50000 UNITS CAPS  capsule Take 1 capsule (50,000 Units total) by mouth every 7 (seven) days. Patient taking differently: Take 50,000 Units by mouth every 7 (seven) days. Takes on [facility] 02/17/13  Yes Deepak Advani, MD  acetaminophen-codeine (TYLENOL #3) 300-30 MG per tablet Take 1 tablet by mouth every 4 (four) hours as needed. Patient not taking: Reported on 02/11/2014 01/01/14   Tresa Garter, MD  diazepam (VALIUM) 5 MG tablet Take 1 tablet (5 mg total) by mouth every 6 (six) hours as needed for muscle spasms. 07/01/14   Katia Hannen, PA-C  doxycycline (VIBRAMYCIN) 100 MG capsule Take 1 capsule (100 mg total) by mouth 2 (two) times daily. One po bid x 7 days Patient not taking: Reported on 06/30/2014 02/11/14   Malvin Johns, MD  fluconazole (DIFLUCAN) 200 MG tablet Take 1 tablet (200 mg total) by mouth daily. Patient not taking: Reported on 01/01/2014 08/16/13   Tresa Garter, MD  metoCLOPramide (REGLAN) 5 MG  tablet Take 1 tablet (5 mg total) by mouth every 8 (eight) hours as needed for nausea or vomiting. Patient not taking: Reported on 02/11/2014 05/15/13   Tresa Garter, MD  ondansetron (ZOFRAN) 4 MG tablet Take 1 tablet (4 mg total) by mouth every 6 (six) hours as needed for nausea or vomiting. Patient not taking: Reported on 01/01/2014 01/14/13   Sherwood Gambler, MD  oxyCODONE-acetaminophen (PERCOCET/ROXICET) 5-325 MG per tablet Take 1 tablet by mouth every 4 (four) hours as needed for severe pain. Patient not taking: Reported on 06/30/2014 12/12/13   Virgel Manifold, MD  ranitidine (ZANTAC) 150 MG tablet Take 1 tablet (150 mg total) by mouth 2 (two) times daily. Patient not taking: Reported on 06/30/2014 03/27/14   Comer Locket, PA-C  sulfamethoxazole-trimethoprim (SEPTRA DS) 800-160 MG per tablet Take 1 tablet by mouth every 12 (twelve) hours. Patient not taking: Reported on 01/01/2014 12/12/13   Virgel Manifold, MD   BP 117/73 mmHg  Pulse 87  Temp(Src) 97.7 F (36.5 C) (Oral)  Resp 16   SpO2 98% Physical Exam  Constitutional: She appears well-developed and well-nourished. No distress.  Awake, alert, nontoxic appearance  HENT:  Head: Normocephalic and atraumatic.  Mouth/Throat: Oropharynx is clear and moist. No oropharyngeal exudate.  Eyes: Conjunctivae are normal. No scleral icterus.  Neck: Normal range of motion. Neck supple.  Cardiovascular: Normal rate, regular rhythm, normal heart sounds and intact distal pulses.   No murmur heard. Pulmonary/Chest: Effort normal and breath sounds normal. No respiratory distress. She has no wheezes.  Equal chest expansion  Abdominal: Soft. Bowel sounds are normal. She exhibits no mass. There is no tenderness. There is no rebound and no guarding.  Soft and nontender  Musculoskeletal: Normal range of motion. She exhibits no edema.  Visible muscle spasms of the right calf  Neurological: She is alert.  Speech is clear and goal oriented Moves extremities without ataxia  Skin: Skin is warm and dry. She is not diaphoretic.  Psychiatric: She has a normal mood and affect.  Nursing note and vitals reviewed.   ED Course  Procedures (including critical care time) Labs Review Labs Reviewed  CBC WITH DIFFERENTIAL/PLATELET - Abnormal; Notable for the following:    RBC 5.15 (*)    Neutrophils Relative % 40 (*)    Lymphocytes Relative 51 (*)    Lymphs Abs 5.1 (*)    All other components within normal limits  COMPREHENSIVE METABOLIC PANEL - Abnormal; Notable for the following:    CO2 19 (*)    Glucose, Bld 116 (*)    Creatinine, Ser 1.02 (*)    Total Protein 8.6 (*)    All other components within normal limits  ETHANOL - Abnormal; Notable for the following:    Alcohol, Ethyl (B) 20 (*)    All other components within normal limits  CK - Abnormal; Notable for the following:    Total CK 713 (*)    All other components within normal limits  URINALYSIS, ROUTINE W REFLEX MICROSCOPIC (NOT AT [facility]) - Abnormal; Notable for the following:     APPearance CLOUDY (*)    Leukocytes, UA MODERATE (*)    All other components within normal limits  URINE MICROSCOPIC-ADD ON - Abnormal; Notable for the following:    Casts HYALINE CASTS (*)    All other components within normal limits  URINE CULTURE  MAGNESIUM  URINE RAPID DRUG SCREEN (HOSP PERFORMED) NOT AT [facility]  CBG MONITORING, ED    Imaging Review No results found.  EKG Interpretation None      MDM   Final diagnoses:  Muscle spasms of both lower extremities  Dehydration  Elevated CK    [name] presents with muscle spasms of the bilateral legs and low back after several days in the heat. Suspect potential dehydration. Will check electrolytes, UA, CK and UDS.  12:02 AM Patient with complete resolution of her muscle spasms after IV Valium. She denies alcohol usage however has a positive ethanol of 20. Patient without elevation in BUN or creatinine to suggest renal failure. CK 713. Patient has been given 2 L of fluids. Will check UA and UDS.  1:04 AM UDS negative. UA without evidence of urinary tract infection and no ketones. Patient reports that she has generalized soreness left but otherwise feels well. She ambulates without difficulty.  Patient will be discharged home. Discussed adequate oral rehydration, decreasing caffeine and alcohol usage and staying out of the heat for the next several days. Recommend close follow-up with PCP in 2 days.  BP 117/73 mmHg  Pulse 87  Temp(Src) 97.7 F (36.5 C) (Oral)  Resp 16  SpO2 98%     Abigail Butts, PA-C 07/01/14 0106  Dorie Rank, MD 07/04/14 1444

## 2014-06-30 NOTE — ED Notes (Signed)
Pt c/o sudden onset muscle spasms, starting in both calves then working it's way up to her R side. Pt has hx of the same. When asked where the pain is pt just sts "It's jumping all over the place." Pt was able to stand and pivot into wheelchair. Lying flat makes the pain better. A&Ox4. Denies numbness and tingling. Moving all extremities.

## 2014-06-30 NOTE — ED Notes (Signed)
Pt reminded once again about need for urine sample. Will ambulate to restroom when second bolus is completed.

## 2014-07-01 LAB — RAPID URINE DRUG SCREEN, HOSP PERFORMED
Amphetamines: NOT DETECTED
Barbiturates: NOT DETECTED
Benzodiazepines: NOT DETECTED
Cocaine: NOT DETECTED
Opiates: NOT DETECTED
Tetrahydrocannabinol: NOT DETECTED

## 2014-07-01 LAB — URINALYSIS, ROUTINE W REFLEX MICROSCOPIC
Bilirubin Urine: NEGATIVE
Glucose, UA: NEGATIVE mg/dL
Hgb urine dipstick: NEGATIVE
Ketones, ur: NEGATIVE mg/dL
Nitrite: NEGATIVE
Protein, ur: NEGATIVE mg/dL
Specific Gravity, Urine: 1.012 (ref 1.005–1.030)
Urobilinogen, UA: 0.2 mg/dL (ref 0.0–1.0)
pH: 5.5 (ref 5.0–8.0)

## 2014-07-01 LAB — URINE MICROSCOPIC-ADD ON

## 2014-07-01 MED ORDER — DIAZEPAM 5 MG PO TABS
5.0000 mg | ORAL_TABLET | Freq: Four times a day (QID) | ORAL | Status: DC | PRN
Start: 2014-07-01 — End: 2014-07-19

## 2014-07-01 NOTE — ED Notes (Signed)
Pt requesting ice chips. Ice chips provided.

## 2014-07-01 NOTE — Discharge Instructions (Signed)
1. Medications: Valium for muscles spasms, usual home medications 2. Treatment: rest, drink plenty of fluids, no EtOH or large amounts of caffeine for several days 3. Follow Up: Please followup with your primary doctor in 2 days for discussion of your diagnoses and further evaluation after today's visit; if you do not have a primary care doctor use the resource guide provided to find one; Please return to the ER for worsening symptoms, darkening urine or spasms that do not resolve with the prescribed medication    Dehydration, Adult Dehydration is when you lose more fluids from the body than you take in. Vital organs like the kidneys, brain, and heart cannot function without a proper amount of fluids and salt. Any loss of fluids from the body can cause dehydration.  CAUSES   Vomiting.  Diarrhea.  Excessive sweating.  Excessive urine output.  Fever. SYMPTOMS  Mild dehydration  Thirst.  Dry lips.  Slightly dry mouth. Moderate dehydration  Very dry mouth.  Sunken eyes.  Skin does not bounce back quickly when lightly pinched and released.  Dark urine and decreased urine production.  Decreased tear production.  Headache. Severe dehydration  Very dry mouth.  Extreme thirst.  Rapid, weak pulse (more than 100 beats per minute at rest).  Cold hands and feet.  Not able to sweat in spite of heat and temperature.  Rapid breathing.  Blue lips.  Confusion and lethargy.  Difficulty being awakened.  Minimal urine production.  No tears. DIAGNOSIS  Your caregiver will diagnose dehydration based on your symptoms and your exam. Blood and urine tests will help confirm the diagnosis. The diagnostic evaluation should also identify the cause of dehydration. TREATMENT  Treatment of mild or moderate dehydration can often be done at home by increasing the amount of fluids that you drink. It is best to drink small amounts of fluid more often. Drinking too much at one time can  make vomiting worse. Refer to the home care instructions below. Severe dehydration needs to be treated at the hospital where you will probably be given intravenous (IV) fluids that contain water and electrolytes. HOME CARE INSTRUCTIONS   Ask your caregiver about specific rehydration instructions.  Drink enough fluids to keep your urine clear or pale yellow.  Drink small amounts frequently if you have nausea and vomiting.  Eat as you normally do.  Avoid:  Foods or drinks high in sugar.  Carbonated drinks.  Juice.  Extremely hot or cold fluids.  Drinks with caffeine.  Fatty, greasy foods.  Alcohol.  Tobacco.  Overeating.  Gelatin desserts.  Wash your hands well to avoid spreading bacteria and viruses.  Only take over-the-counter or prescription medicines for pain, discomfort, or fever as directed by your caregiver.  Ask your caregiver if you should continue all prescribed and over-the-counter medicines.  Keep all follow-up appointments with your caregiver. SEEK MEDICAL CARE IF:  You have abdominal pain and it increases or stays in one area (localizes).  You have a rash, stiff neck, or severe headache.  You are irritable, sleepy, or difficult to awaken.  You are weak, dizzy, or extremely thirsty. SEEK IMMEDIATE MEDICAL CARE IF:   You are unable to keep fluids down or you get worse despite treatment.  You have frequent episodes of vomiting or diarrhea.  You have blood or green matter (bile) in your vomit.  You have blood in your stool or your stool looks black and tarry.  You have not urinated in 6 to 8 hours, or you  have only urinated a small amount of very dark urine.  You have a fever.  You faint. MAKE SURE YOU:   Understand these instructions.  Will watch your condition.  Will get help right away if you are not doing well or get worse. Document Released: 01/19/2005 Document Revised: 04/13/2011 Document Reviewed: 09/08/2010 [facility] Patient  Information 2015 [place], [place]. This information is not intended to replace advice given to you by your health care provider. Make sure you discuss any questions you have with your health care provider.

## 2014-07-01 NOTE — ED Notes (Signed)
Pt alert, oriented, and ambulatory upon DC. She was wheeled to lobby and assisted in the vehicle by NT [place]. Pt was advised to follow up with PCP in 2 days and to take valium as needed.  Family driving patient home.

## 2014-07-02 LAB — CBG MONITORING, ED: Glucose-Capillary: 121 mg/dL — ABNORMAL HIGH (ref 65–99)

## 2014-07-02 LAB — URINE CULTURE: Colony Count: 15000

## 2014-07-19 ENCOUNTER — Other Ambulatory Visit: Payer: Self-pay | Admitting: Internal Medicine

## 2014-07-19 ENCOUNTER — Ambulatory Visit: Payer: Self-pay | Attending: Internal Medicine | Admitting: Internal Medicine

## 2014-07-19 ENCOUNTER — Encounter: Payer: Self-pay | Admitting: Internal Medicine

## 2014-07-19 VITALS — BP 116/78 | HR 69 | Temp 98.4°F | Resp 18 | Ht 66.0 in | Wt 167.4 lb

## 2014-07-19 DIAGNOSIS — K219 Gastro-esophageal reflux disease without esophagitis: Secondary | ICD-10-CM

## 2014-07-19 DIAGNOSIS — Z1231 Encounter for screening mammogram for malignant neoplasm of breast: Secondary | ICD-10-CM

## 2014-07-19 DIAGNOSIS — M545 Low back pain, unspecified: Secondary | ICD-10-CM

## 2014-07-19 DIAGNOSIS — M109 Gout, unspecified: Secondary | ICD-10-CM

## 2014-07-19 DIAGNOSIS — I10 Essential (primary) hypertension: Secondary | ICD-10-CM

## 2014-07-19 DIAGNOSIS — E119 Type 2 diabetes mellitus without complications: Secondary | ICD-10-CM

## 2014-07-19 DIAGNOSIS — M1000 Idiopathic gout, unspecified site: Secondary | ICD-10-CM

## 2014-07-19 DIAGNOSIS — J4530 Mild persistent asthma, uncomplicated: Secondary | ICD-10-CM

## 2014-07-19 DIAGNOSIS — J45909 Unspecified asthma, uncomplicated: Secondary | ICD-10-CM | POA: Insufficient documentation

## 2014-07-19 LAB — COMPLETE METABOLIC PANEL WITH GFR
ALT: 14 U/L (ref 0–35)
AST: 18 U/L (ref 0–37)
Albumin: 4.5 g/dL (ref 3.5–5.2)
Alkaline Phosphatase: 113 U/L (ref 39–117)
BUN: 12 mg/dL (ref 6–23)
CO2: 27 mEq/L (ref 19–32)
Calcium: 9.6 mg/dL (ref 8.4–10.5)
Chloride: 104 mEq/L (ref 96–112)
Creat: 0.9 mg/dL (ref 0.50–1.10)
GFR, Est African American: 84 mL/min
GFR, Est Non African American: 73 mL/min
Glucose, Bld: 90 mg/dL (ref 70–99)
Potassium: 4 mEq/L (ref 3.5–5.3)
Sodium: 138 mEq/L (ref 135–145)
Total Bilirubin: 0.4 mg/dL (ref 0.2–1.2)
Total Protein: 7.8 g/dL (ref 6.0–8.3)

## 2014-07-19 LAB — LIPID PANEL
Cholesterol: 194 mg/dL (ref 0–200)
HDL: 46 mg/dL (ref >=46)
LDL Cholesterol: 124 mg/dL — ABNORMAL HIGH (ref 0–99)
Total CHOL/HDL Ratio: 4.2 Ratio
Triglycerides: 118 mg/dL (ref <150)
VLDL: 24 mg/dL (ref 0–40)

## 2014-07-19 LAB — TSH: TSH: 1.677 u[IU]/mL (ref 0.350–4.500)

## 2014-07-19 LAB — URIC ACID: Uric Acid, Serum: 5.3 mg/dL (ref 2.4–7.0)

## 2014-07-19 LAB — POCT GLYCOSYLATED HEMOGLOBIN (HGB A1C): Hemoglobin A1C: 6.3

## 2014-07-19 LAB — GLUCOSE, POCT (MANUAL RESULT ENTRY): POC Glucose: 94 mg/dl (ref 70–99)

## 2014-07-19 MED ORDER — CYCLOBENZAPRINE HCL 10 MG PO TABS: 10.0000 mg | ORAL_TABLET | Freq: Every day | ORAL | Status: DC

## 2014-07-19 MED ORDER — FREESTYLE LANCETS MISC: Status: DC

## 2014-07-19 MED ORDER — PANTOPRAZOLE SODIUM 40 MG PO TBEC: 40.0000 mg | DELAYED_RELEASE_TABLET | Freq: Every day | ORAL | Status: DC

## 2014-07-19 MED ORDER — ACETAMINOPHEN-CODEINE #3 300-30 MG PO TABS: 1.0000 | ORAL_TABLET | ORAL | Status: DC | PRN

## 2014-07-19 MED ORDER — EPINEPHRINE 0.3 MG/0.3ML IJ SOAJ: 0.3000 mg | Freq: Once | INTRAMUSCULAR | Status: DC

## 2014-07-19 MED ORDER — TRUE METRIX METER DEVI: 1.0000 | Freq: Three times a day (TID) | Status: DC

## 2014-07-19 MED ORDER — VITAMIN D (ERGOCALCIFEROL) 1.25 MG (50000 UNIT) PO CAPS
50000.0000 [IU] | ORAL_CAPSULE | ORAL | Status: DC
Start: 2014-07-19 — End: 2014-10-11

## 2014-07-19 MED ORDER — VALSARTAN-HYDROCHLOROTHIAZIDE 160-12.5 MG PO TABS: 1.0000 | ORAL_TABLET | Freq: Every day | ORAL | Status: DC

## 2014-07-19 MED ORDER — ALLOPURINOL 100 MG PO TABS: 100.0000 mg | ORAL_TABLET | Freq: Every day | ORAL | Status: DC

## 2014-07-19 MED ORDER — GABAPENTIN 300 MG PO CAPS: 300.0000 mg | ORAL_CAPSULE | Freq: Three times a day (TID) | ORAL | Status: DC

## 2014-07-19 MED ORDER — COLCHICINE 0.6 MG PO TABS: 0.6000 mg | ORAL_TABLET | Freq: Every day | ORAL | Status: DC

## 2014-07-19 MED ORDER — METFORMIN HCL ER 750 MG PO TB24: 750.0000 mg | ORAL_TABLET | Freq: Every day | ORAL | Status: DC

## 2014-07-19 MED ORDER — BUDESONIDE-FORMOTEROL FUMARATE 80-4.5 MCG/ACT IN AERO: 2.0000 | INHALATION_SPRAY | Freq: Two times a day (BID) | RESPIRATORY_TRACT | Status: DC

## 2014-07-19 MED ORDER — HYDROXYZINE HCL 10 MG PO TABS: 10.0000 mg | ORAL_TABLET | Freq: Three times a day (TID) | ORAL | Status: DC | PRN

## 2014-07-19 MED ORDER — ALBUTEROL SULFATE (2.5 MG/3ML) 0.083% IN NEBU: 2.5000 mg | INHALATION_SOLUTION | RESPIRATORY_TRACT | Status: DC | PRN

## 2014-07-19 MED ORDER — TRUEPLUS LANCETS 26G MISC: 1.0000 | Freq: Three times a day (TID) | Status: DC

## 2014-07-19 MED ORDER — FREESTYLE SYSTEM KIT: 1.0000 | PACK | Status: DC | PRN

## 2014-07-19 NOTE — Progress Notes (Signed)
Patient here for ED follow up for muscle spasms. Patient reports having muscle spasms in lower legs, right side of abdomen and lower back. Patient reports still experiencing muscle spasms since she has been out of the ED in the same areas especially during daytime and night when she is laying down. Patient reports she has been having lower back pain and knee pain in both knees. Pain rated at 8, constant, and describes pain as stabbing and burning. Pain started last year, but it has gotten worse. Patient reports breaking out in sweats all over face and arm often. Patient states this has been going on for about 3 months.   Patient CBG 84 today. Patient has not ate and is out of diabetes medication.   Patient reports that she needs a colonoscopy.   Patient needs refill on medications. Patient needs glucose monitor with lancets and strips.

## 2014-07-19 NOTE — Patient Instructions (Signed)
Diabetes and Exercise Exercising regularly is important. It is not just about losing weight. It has many health benefits, such as:  Improving your overall fitness, flexibility, and endurance.  Increasing your bone density.  Helping with weight control.  Decreasing your body fat.  Increasing your muscle strength.  Reducing stress and tension.  Improving your overall health. People with diabetes who exercise gain additional benefits because exercise:  Reduces appetite.  Improves the body's use of blood sugar (glucose).  Helps lower or control blood glucose.  Decreases blood pressure.  Helps control blood lipids (such as cholesterol and triglycerides).  Improves the body's use of the hormone insulin by:  Increasing the body's insulin sensitivity.  Reducing the body's insulin needs.  Decreases the risk for heart disease because exercising:  Lowers cholesterol and triglycerides levels.  Increases the levels of good cholesterol (such as high-density lipoproteins [HDL]) in the body.  Lowers blood glucose levels. YOUR ACTIVITY PLAN  Choose an activity that you enjoy and set realistic goals. Your health care provider or diabetes educator can help you make an activity plan that works for you. Exercise regularly as directed by your health care provider. This includes:  Performing resistance training twice a week such as push-ups, sit-ups, lifting weights, or using resistance bands.  Performing 150 minutes of cardio exercises each week such as walking, running, or playing sports.  Staying active and spending no more than 90 minutes at one time being inactive. Even short bursts of exercise are good for you. Three 10-minute sessions spread throughout the day are just as beneficial as a single 30-minute session. Some exercise ideas include:  Taking the dog for a walk.  Taking the stairs instead of the elevator.  Dancing to your favorite song.  Doing an exercise  video.  Doing your favorite exercise with a friend. RECOMMENDATIONS FOR EXERCISING WITH TYPE 1 OR TYPE 2 DIABETES   Check your blood glucose before exercising. If blood glucose levels are greater than 240 mg/dL, check for urine ketones. Do not exercise if ketones are present.  Avoid injecting insulin into areas of the body that are going to be exercised. For example, avoid injecting insulin into:  The arms when playing tennis.  The legs when jogging.  Keep a record of:  Food intake before and after you exercise.  Expected peak times of insulin action.  Blood glucose levels before and after you exercise.  The type and amount of exercise you have done.  Review your records with your health care provider. Your health care provider will help you to develop guidelines for adjusting food intake and insulin amounts before and after exercising.  If you take insulin or oral hypoglycemic agents, watch for signs and symptoms of hypoglycemia. They include:  Dizziness.  Shaking.  Sweating.  Chills.  Confusion.  Drink plenty of water while you exercise to prevent dehydration or heat stroke. Body water is lost during exercise and must be replaced.  Talk to your health care provider before starting an exercise program to make sure it is safe for you. Remember, almost any type of activity is better than none. Document Released: 04/11/2003 Document Revised: 06/05/2013 Document Reviewed: 06/28/2012 [employer] Patient Information 2015 [employer]. This information is not intended to replace advice given to you by your health care provider. Make sure you discuss any questions you have with your health care provider. Basic Carbohydrate Counting for Diabetes Mellitus Carbohydrate counting is a method for keeping track of the amount of carbohydrates you eat.   Eating carbohydrates naturally increases the level of sugar (glucose) in your blood, so it is important for you to know the amount that is  okay for you to have in every meal. Carbohydrate counting helps keep the level of glucose in your blood within normal limits. The amount of carbohydrates allowed is different for every person. A dietitian can help you calculate the amount that is right for you. Once you know the amount of carbohydrates you can have, you can count the carbohydrates in the foods you want to eat. Carbohydrates are found in the following foods:  Grains, such as breads and cereals.  Dried beans and soy products.  Starchy vegetables, such as potatoes, peas, and corn.  Fruit and fruit juices.  Milk and yogurt.  Sweets and snack foods, such as cake, cookies, candy, chips, soft drinks, and fruit drinks. CARBOHYDRATE COUNTING There are two ways to count the carbohydrates in your food. You can use either of the methods or a combination of both. Reading the "Nutrition Facts" on Packaged Food The "Nutrition Facts" is an area that is included on the labels of almost all packaged food and beverages in the [place]. It includes the serving size of that food or beverage and information about the nutrients in each serving of the food, including the grams (g) of carbohydrate per serving.  Decide the number of servings of this food or beverage that you will be able to eat or drink. Multiply that number of servings by the number of grams of carbohydrate that is listed on the label for that serving. The total will be the amount of carbohydrates you will be having when you eat or drink this food or beverage. Learning Standard Serving Sizes of Food When you eat food that is not packaged or does not include "Nutrition Facts" on the label, you need to measure the servings in order to count the amount of carbohydrates.A serving of most carbohydrate-rich foods contains about 15 g of carbohydrates. The following list includes serving sizes of carbohydrate-rich foods that provide 15 g ofcarbohydrate per serving:   1 slice of bread  (1 oz) or 1 six-inch tortilla.    of a hamburger bun or English muffin.  4-6 crackers.   cup unsweetened dry cereal.    cup hot cereal.   cup rice or pasta.    cup mashed potatoes or  of a large baked potato.  1 cup fresh fruit or one small piece of fruit.    cup canned or frozen fruit or fruit juice.  1 cup milk.   cup plain fat-free yogurt or yogurt sweetened with artificial sweeteners.   cup cooked dried beans or starchy vegetable, such as peas, corn, or potatoes.  Decide the number of standard-size servings that you will eat. Multiply that number of servings by 15 (the grams of carbohydrates in that serving). For example, if you eat 2 cups of strawberries, you will have eaten 2 servings and 30 g of carbohydrates (2 servings x 15 g = 30 g). For foods such as soups and casseroles, in which more than one food is mixed in, you will need to count the carbohydrates in each food that is included. EXAMPLE OF CARBOHYDRATE COUNTING Sample Dinner  3 oz chicken breast.   cup of brown rice.   cup of corn.  1 cup milk.   1 cup strawberries with sugar-free whipped topping.  Carbohydrate Calculation Step 1: Identify the foods that contain carbohydrates:   Rice.   Corn.     Milk.   Strawberries. Step 2:Calculate the number of servings eaten of each:   2 servings of rice.   1 serving of corn.   1 serving of milk.   1 serving of strawberries. Step 3: Multiply each of those number of servings by 15 g:   2 servings of rice x 15 g = 30 g.   1 serving of corn x 15 g = 15 g.   1 serving of milk x 15 g = 15 g.   1 serving of strawberries x 15 g = 15 g. Step 4: Add together all of the amounts to find the total grams of carbohydrates eaten: 30 g + 15 g + 15 g + 15 g = 75 g. Document Released: 01/19/2005 Document Revised: 06/05/2013 Document Reviewed: 12/16/2012 [employer] Patient Information 2015 [employer]. This information is not intended to  replace advice given to you by your health care provider. Make sure you discuss any questions you have with your health care provider.  

## 2014-07-19 NOTE — Progress Notes (Signed)
Patient ID: Stacy [name], female   DOB: 1960-09-29, 54 y.o.   MRN: [id number]   Stacy [name], is a 54 y.o. female  YHC:[id number]  [id number]  DOB - August 07, 1960  Chief Complaint  Patient presents with  . Follow-up    ED- for muscle spasms        Subjective:   Stacy [name] is a 54 y.o. female here today for a follow up visit. Patient has history of hypertension, GERD, COPD, diabetes mellitus here for ED follow up for muscle spasms. Patient reports having muscle spasms in lower legs, right side of abdomen and lower back. Patient reports still experiencing muscle spasms since she has been out of the ED in the same areas especially during daytime and night when she is laying down. Patient reports she has been having lower back pain and knee pain in both knees. Pain rated at 8, constant, and describes pain as stabbing and burning. Pain started last year, but it has gotten worse. Patient reports breaking out in sweats all over face and arm often. Patient states this has been going on for about 3 months. Patient CBG 84 today. Patient needs refill on all er medications because she has been out for a while. She has not had colonoscopy. Patient needs glucose monitor with lancets and strips.  She continues to smoke heavily and lately has increased number of cigarettes per day because of stress at home. She denies being depressed , she denies suicidal ideation or thoughts.  Problem  Asthma, Chronic  Gout of Big Toe    ALLERGIES: Allergies  Allergen Reactions  . Other Shortness Of Breath    Allergic to perfumes and cleaning products  . Aspirin Nausea Only    unknown  . Ace Inhibitors Other (See Comments)    Cough, wheeze    PAST MEDICAL HISTORY: Past Medical History  Diagnosis Date  . Hypertension     Pt on lisinopril  . GERD (gastroesophageal reflux disease)     Pt on Protonix daily  . Arthritis     Takes Naprosyn   . Diabetes mellitus     Takes Metformin 750 mg  .  Hyperlipidemia   . Asthma   . COPD (chronic obstructive pulmonary disease)   . Shortness of breath     occasional - uses breathing tx at home  . Headache(784.0)     otc meds prn  . Irritable bowel syndrome 11/19/2010    MEDICATIONS AT HOME: Prior to Admission medications   Medication Sig Start Date End Date Taking? Authorizing Provider  acetaminophen-codeine (TYLENOL #3) 300-30 MG per tablet Take 1 tablet by mouth every 4 (four) hours as needed. 07/19/14   Tresa Garter, MD  albuterol (PROVENTIL) (2.5 MG/3ML) 0.083% nebulizer solution Take 3 mLs (2.5 mg total) by nebulization every 4 (four) hours as needed for wheezing. 07/19/14   Tresa Garter, MD  allopurinol (ZYLOPRIM) 100 MG tablet Take 1 tablet (100 mg total) by mouth daily. 07/19/14   Tresa Garter, MD  budesonide-formoterol (SYMBICORT) 80-4.5 MCG/ACT inhaler Inhale 2 puffs into the lungs 2 (two) times daily. 07/19/14 07/19/15  Tresa Garter, MD  colchicine 0.6 MG tablet Take 1 tablet (0.6 mg total) by mouth daily. 07/19/14   Tresa Garter, MD  cyclobenzaprine (FLEXERIL) 10 MG tablet Take 1 tablet (10 mg total) by mouth at bedtime. 07/19/14   Tresa Garter, MD  gabapentin (NEURONTIN) 300 MG capsule Take 1 capsule (300 mg total) by mouth 3 (three) times  daily. 07/19/14   Tresa Garter, MD  glucose monitoring kit (FREESTYLE) monitoring kit 1 each by Does not apply route as needed for other. 07/19/14   Tresa Garter, MD  hydrOXYzine (ATARAX/VISTARIL) 10 MG tablet Take 1 tablet (10 mg total) by mouth 3 (three) times daily as needed. 07/19/14   Tresa Garter, MD  Lancets (FREESTYLE) lancets Use as instructed 07/19/14   Tresa Garter, MD  metFORMIN (GLUCOPHAGE-XR) 750 MG 24 hr tablet Take 1 tablet (750 mg total) by mouth daily with breakfast. 07/19/14   Tresa Garter, MD  metoCLOPramide (REGLAN) 5 MG tablet Take 1 tablet (5 mg total) by mouth every 8 (eight) hours as needed for nausea or  vomiting. Patient not taking: Reported on 02/11/2014 05/15/13   Tresa Garter, MD  pantoprazole (PROTONIX) 40 MG tablet Take 1 tablet (40 mg total) by mouth daily. 07/19/14   Tresa Garter, MD  valsartan-hydrochlorothiazide (DIOVAN-HCT) 160-12.5 MG per tablet Take 1 tablet by mouth daily. 07/19/14   Tresa Garter, MD  Vitamin D, Ergocalciferol, (DRISDOL) 50000 UNITS CAPS capsule Take 1 capsule (50,000 Units total) by mouth every 7 (seven) days. 07/19/14   Tresa Garter, MD     Objective:   Filed Vitals:   07/19/14 1050 07/19/14 1059  BP:  116/78  Pulse:  69  Temp:  98.4 F (36.9 C)  TempSrc:  Oral  Resp:  18  Height: _0  (1.676 m)   Weight: 167 lb 6.4 oz (75.932 kg)   SpO2:  99%    Exam General appearance : Awake, alert, not in any distress. Speech Clear. Not toxic looking HEENT: Atraumatic and Normocephalic, pupils equally reactive to light and accomodation Neck: supple, no JVD. No cervical lymphadenopathy.  Chest:Good air entry bilaterally, no added sounds  CVS: S1 S2 regular, no murmurs.  Abdomen: Bowel sounds present, Non tender and not distended with no gaurding, rigidity or rebound. Extremities: B/L Lower Ext shows no edema, both legs are warm to touch Neurology: Awake alert, and oriented X 3, CN II-XII intact, Non focal Skin:No Rash  Data Review Lab Results  Component Value Date   HGBA1C 6.30 07/19/2014   HGBA1C 6.7 01/01/2014   HGBA1C 6.5 08/14/2013     Assessment & Plan   1. Type 2 diabetes mellitus without complication  - Microalbumin, urine - Glucose (CBG) - HgB A1c - gabapentin (NEURONTIN) 300 MG capsule; Take 1 capsule (300 mg total) by mouth 3 (three) times daily.  Dispense: 270 capsule; Refill: 3 - glucose monitoring kit (FREESTYLE) monitoring kit; 1 each by Does not apply route as needed for other.  Dispense: 1 each; Refill: 3 - Lancets (FREESTYLE) lancets; Use as instructed  Dispense: 100 each; Refill: 12 - metFORMIN  (GLUCOPHAGE-XR) 750 MG 24 hr tablet; Take 1 tablet (750 mg total) by mouth daily with breakfast.  Dispense: 90 tablet; Refill: 3 - COMPLETE METABOLIC PANEL WITH GFR - Lipid panel   Aim for 2-3 Carb Choices per meal (30-45 grams) +/- 1 either way  Aim for 0-15 Carbs per snack if hungry  Include protein in moderation with your meals and snacks  Consider reading food labels for Total Carbohydrate and Fat Grams of foods  Consider checking BG at alternate times per day  Continue taking medication as directed Fruit Punch - find one with no sugar  Measure and decrease portions of carbohydrate foods  Make your plate and don't go back for seconds   2. Bilateral low back  pain without sciatica  - acetaminophen-codeine (TYLENOL #3) 300-30 MG per tablet; Take 1 tablet by mouth every 4 (four) hours as needed.  Dispense: 60 tablet; Refill: 0 - budesonide-formoterol (SYMBICORT) 80-4.5 MCG/ACT inhaler; Inhale 2 puffs into the lungs 2 (two) times daily.  Dispense: 3 Inhaler; Refill: 3 - cyclobenzaprine (FLEXERIL) 10 MG tablet; Take 1 tablet (10 mg total) by mouth at bedtime.  Dispense: 90 tablet; Refill: 3 - Vitamin D, Ergocalciferol, (DRISDOL) 50000 UNITS CAPS capsule; Take 1 capsule (50,000 Units total) by mouth every 7 (seven) days.  Dispense: 12 capsule; Refill: 0 - TSH - ANA  3. Asthma, chronic, mild persistent, uncomplicated  - albuterol (PROVENTIL) (2.5 MG/3ML) 0.083% nebulizer solution; Take 3 mLs (2.5 mg total) by nebulization every 4 (four) hours as needed for wheezing.  Dispense: 75 mL; Refill: 3 - hydrOXYzine (ATARAX/VISTARIL) 10 MG tablet; Take 1 tablet (10 mg total) by mouth 3 (three) times daily as needed.  Dispense: 30 tablet; Refill: 3  4. Gastroesophageal reflux disease without esophagitis  - pantoprazole (PROTONIX) 40 MG tablet; Take 1 tablet (40 mg total) by mouth daily.  Dispense: 90 tablet; Refill: 3  5. Essential hypertension  - valsartan-hydrochlorothiazide (DIOVAN-HCT)  160-12.5 MG per tablet; Take 1 tablet by mouth daily.  Dispense: 90 tablet; Refill: 3  - We have discussed target BP range and blood pressure goal - I have advised patient to check BP regularly and to call us back or report to clinic if the numbers are consistently higher than 140/90  - We discussed the importance of compliance with medical therapy and DASH diet recommended, consequences of uncontrolled hypertension discussed.  - continue current BP medications  6. Gout of big toe  - allopurinol (ZYLOPRIM) 100 MG tablet; Take 1 tablet (100 mg total) by mouth daily.  Dispense: 90 tablet; Refill: 3 - colchicine 0.6 MG tablet; Take 1 tablet (0.6 mg total) by mouth daily.  Dispense: 90 tablet; Refill: 3 - Uric acid  Patient have been counseled extensively about nutrition and exercise Return in about 3 months (around 10/19/2014) for Hemoglobin A1C and Follow up, DM, Follow up HTN, Follow up Pain and comorbidities.  The patient was given clear instructions to go to ER or return to medical center if symptoms don't improve, worsen or new problems develop. The patient verbalized understanding. The patient was told to call to get lab results if they haven't heard anything in the next week.   This note has been created with Surveyor, quantity. Any transcriptional errors are unintentional.    Angelica Chessman, MD, [place], [place], [place], [place] and [place], [place]   07/19/2014, 11:38 AM

## 2014-07-20 LAB — ANA: Anti Nuclear Antibody(ANA): NEGATIVE

## 2014-07-20 LAB — MICROALBUMIN, URINE: Microalb, Ur: <0.2 mg/dL (ref <2.0)

## 2014-07-23 ENCOUNTER — Telehealth: Payer: Self-pay | Admitting: Clinical

## 2014-07-23 NOTE — Telephone Encounter (Signed)
HIPPA-compliant message to call back; first attempt to f/u w pt

## 2014-07-23 NOTE — Telephone Encounter (Signed)
Pt mobile number is no longer valid or not in service; unable to leave message

## 2014-07-26 ENCOUNTER — Ambulatory Visit (HOSPITAL_COMMUNITY): Payer: Self-pay

## 2014-07-27 ENCOUNTER — Other Ambulatory Visit: Payer: Self-pay | Admitting: Internal Medicine

## 2014-07-27 MED ORDER — SIMVASTATIN 20 MG PO TABS: 20.0000 mg | ORAL_TABLET | Freq: Every day | ORAL | Status: DC

## 2014-07-31 ENCOUNTER — Encounter: Payer: Self-pay | Admitting: Sports Medicine

## 2014-07-31 ENCOUNTER — Ambulatory Visit
Admission: RE | Admit: 2014-07-31 | Discharge: 2014-07-31 | Disposition: A | Discharge status: Home / Self Care | Payer: No Typology Code available for payment source | Source: Ambulatory Visit | Attending: Sports Medicine | Admitting: Sports Medicine

## 2014-07-31 ENCOUNTER — Ambulatory Visit (INDEPENDENT_AMBULATORY_CARE_PROVIDER_SITE_OTHER): Payer: Self-pay | Admitting: Sports Medicine

## 2014-07-31 ENCOUNTER — Other Ambulatory Visit: Payer: Self-pay | Admitting: Pharmacist

## 2014-07-31 VITALS — BP 136/69 | Ht 66.0 in | Wt 170.0 lb

## 2014-07-31 DIAGNOSIS — M25562 Pain in left knee: Principal | ICD-10-CM

## 2014-07-31 DIAGNOSIS — M25561 Pain in right knee: Secondary | ICD-10-CM

## 2014-07-31 MED ORDER — METHYLPREDNISOLONE ACETATE 40 MG/ML IJ SUSP
40.0000 mg | Freq: Once | INTRAMUSCULAR | Status: AC
Start: 2014-07-31 — End: 2014-07-31
  Administered 2014-07-31: 40 mg via INTRA_ARTICULAR

## 2014-07-31 MED ORDER — GLUCOSE BLOOD VI STRP
ORAL_STRIP | Status: DC
Start: 2014-07-31 — End: 2016-06-10

## 2014-07-31 MED ORDER — METHYLPREDNISOLONE ACETATE 40 MG/ML IJ SUSP
40.0000 mg | Freq: Once | INTRAMUSCULAR | Status: AC
  Administered 2014-07-31: 40 mg via INTRA_ARTICULAR

## 2014-07-31 NOTE — Assessment & Plan Note (Signed)
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 No gross instability versus suspicion for fracture. -Ordered bilateral standing knee films. -She tolerated the bilateral knee CST injections well today. -Rest, ice, elevate, analgesics if needed. -She'll follow-up in 4 weeks or sooner if needed. If she has ongoing knee pain and mechanical symptoms, we may recommend obtaining an MRI of the more symptomatic knee in anticipation of possible arthroscopic intervention.

## 2014-07-31 NOTE — Addendum Note (Signed)
Addended by: Linward HeadlandSTRAUGHN, Breylan Lefevers N on: 07/31/2014 02:54 PM   Modules accepted: Orders

## 2014-07-31 NOTE — Progress Notes (Signed)
Subjective:    Patient ID: Stacy LinPamela [name], female    DOB: 1960/05/06, 54 y.o.   MRN: [id number]  HPI Stacy [name] is a 54 year old female who presents for evaluation of bilateral knee pain. She has dealt with chronic bilateral knee pain for several years. She was last seen in our clinic in early 2015 at which time a cortical steroid-induced and was performed, which he says provided long-term relief. She has a history of left knee arthroscopically in 2014. She notes bilateral anterior peripatellar knee pain that is worse with standing from a seated position or going up stairs. She denies any recent injuries. She notes associated popping and crepitus. She has tried ice and Tylenol No. 3 with some relief. She has had formal physical therapy in the past, but her finances somewhat limits her ability to afford this. She denies any knee giving way or locking. History is complicated by L4-5 degenerative disc disease and facet arthropathy. Occasionally, she notices radicular pain from the back to the knees, but today her pain is primarily located at the knees. She has had an MRI of her lumbar spine which showed degenerative disc disease at L4-5 as well as mild to moderate spinal stenosis. She also has a history of gout which is well controlled with recent uric acid within normal range. She also has a history of diabetes, on oral medications only, which is fairly well controlled with last hemoglobin A1c 6.3. She denies any persistent cramping, as this was relieved with cyclobenzaprine. Question if this most recent cramping episode was due to neurogenic lumbar claudication.  Past medical history, social history, medications, and allergies were reviewed and are up to date in the chart.  Review of Systems 7 point review of systems was performed and was otherwise negative unless noted in the history of present illness.     Objective:   Physical Exam BP 136/69 mmHg  Ht 5\' 6"  (1.676 m)  Wt 170 lb (77.111 kg)  BMI  27.45 kg/m2 GEN: The patient is well-developed well-nourished female and in no acute distress.  She is awake alert and oriented x3. SKIN: warm and well-perfused, no rash  EXTR: No lower extremity edema or calf tenderness Neuro: Strength 5/5 globally. Sensation intact throughout. No focal deficits. Vasc: +2 bilateral distal pulses. No edema.  MSK: Examination of the bilateral knees reveals range of motion from 0-120 with pain at the endpoints of motion. Bilateral medial joint line tenderness to palpation. Positive patellar grind test bilaterally. Trace effusion is noted greater on the left compared to right. No overlying erythema or induration. Full internal/external range of motion of the bilateral hips without pain. No instability with valgus or varus testing of the knee. Mild tenderness to palpation in a bandlike pattern from L4-S1. Negative seated straight leg raise test. No atrophy or fasciculation of the lower extremities.  Right Knee Injection Procedure:  Risks, benefits and complications were reviewed with the patient. After obtaining informed verbal consent the right knee was sterilely prepped with alcohol.  Ethyl chloride was used to anesthetize the skin and intra-articular injection using a 25-gauge by 1-1/2 inch needle was accomplished without difficulty.  We injected 40mg  DepoMedrol with 4 cc 1% lidocaine without epinephrine.  Patient tolerated the procedure well and was observed for 5-10 min. Post injection instructions, including signs and symptoms of complications were discussed.  Left Knee Injection Procedure:  Risks, benefits and complications were reviewed with the patient. After obtaining informed verbal consent the left knee was sterilely prepped with  alcohol.  Ethyl chloride was used to anesthetize the skin and intra-articular injection using a 25-gauge by 1-1/2 inch needle was accomplished without difficulty.  We injected  DepoMedrol with 4 cc 1% lidocaine without epinephrine.   Patient tolerated the procedure well and was observed for 5-10 min. Post injection instructions, including signs and symptoms of complications were discussed.     Assessment & Plan:  Please see problem based assessment and plan in the problem list.

## 2014-08-01 ENCOUNTER — Ambulatory Visit (HOSPITAL_COMMUNITY)
Admission: RE | Admit: 2014-08-01 | Discharge: 2014-08-01 | Disposition: A | Discharge status: Home / Self Care | Payer: No Typology Code available for payment source | Source: Ambulatory Visit | Attending: Internal Medicine | Admitting: Internal Medicine

## 2014-08-01 DIAGNOSIS — Z1231 Encounter for screening mammogram for malignant neoplasm of breast: Secondary | ICD-10-CM

## 2014-08-03 ENCOUNTER — Telehealth: Payer: Self-pay

## 2014-08-03 NOTE — Telephone Encounter (Signed)
Nurse called patient, patient verified date of birth. Patient aware of screening mammogram negative for malignancy and Dr. Hyman HopesJegede recommends screening mammogram in one year. Patient voices understanding and has no further questions at this time.

## 2014-08-03 NOTE — Telephone Encounter (Signed)
-----   Message from Quentin Angstlugbemiga E Jegede, MD sent at 08/03/2014 12:32 PM EDT ----- Please inform patient that her screening mammogram is negative for malignancy. Recommend screening mammogram in one year.

## 2014-08-10 ENCOUNTER — Telehealth: Payer: Self-pay | Admitting: *Deleted

## 2014-08-10 NOTE — Telephone Encounter (Signed)
-----   Message from Quentin Angstlugbemiga E Jegede, MD sent at 07/27/2014  6:09 PM EDT ----- Please inform patient had laboratory test results are all normal except for the cholesterol level that is slightly high. To address this please limit saturated fat to no more than 7% of your calories, limit cholesterol to 200 mg/day, increase fiber and exercise as tolerated. We will start her on low dose cholesterol lowering medication. Simvastatin has been called to the pharmacy

## 2014-08-10 NOTE — Telephone Encounter (Signed)
Verified name and date of birth and gave results.  Patient states she has already picked up her medication.  We discussed healthy eating choices and exercise.  Patient verbalized understanding.

## 2014-08-15 ENCOUNTER — Emergency Department (HOSPITAL_COMMUNITY)
Admission: EM | Admit: 2014-08-15 | Discharge: 2014-08-15 | Disposition: A | Discharge status: Home / Self Care | Payer: No Typology Code available for payment source | Attending: Emergency Medicine | Admitting: Emergency Medicine

## 2014-08-15 ENCOUNTER — Emergency Department (HOSPITAL_COMMUNITY): Payer: No Typology Code available for payment source

## 2014-08-15 ENCOUNTER — Encounter (HOSPITAL_COMMUNITY): Payer: Self-pay

## 2014-08-15 DIAGNOSIS — Z79899 Other long term (current) drug therapy: Secondary | ICD-10-CM | POA: Insufficient documentation

## 2014-08-15 DIAGNOSIS — Z7951 Long term (current) use of inhaled steroids: Secondary | ICD-10-CM | POA: Insufficient documentation

## 2014-08-15 DIAGNOSIS — J449 Chronic obstructive pulmonary disease, unspecified: Secondary | ICD-10-CM | POA: Insufficient documentation

## 2014-08-15 DIAGNOSIS — Y998 Other external cause status: Secondary | ICD-10-CM | POA: Insufficient documentation

## 2014-08-15 DIAGNOSIS — Y9389 Activity, other specified: Secondary | ICD-10-CM | POA: Insufficient documentation

## 2014-08-15 DIAGNOSIS — S8991XA Unspecified injury of right lower leg, initial encounter: Secondary | ICD-10-CM

## 2014-08-15 DIAGNOSIS — M1990 Unspecified osteoarthritis, unspecified site: Secondary | ICD-10-CM | POA: Insufficient documentation

## 2014-08-15 DIAGNOSIS — E119 Type 2 diabetes mellitus without complications: Secondary | ICD-10-CM | POA: Insufficient documentation

## 2014-08-15 DIAGNOSIS — I10 Essential (primary) hypertension: Secondary | ICD-10-CM | POA: Insufficient documentation

## 2014-08-15 DIAGNOSIS — K219 Gastro-esophageal reflux disease without esophagitis: Secondary | ICD-10-CM | POA: Insufficient documentation

## 2014-08-15 DIAGNOSIS — E785 Hyperlipidemia, unspecified: Secondary | ICD-10-CM | POA: Insufficient documentation

## 2014-08-15 DIAGNOSIS — Y92410 Unspecified street and highway as the place of occurrence of the external cause: Secondary | ICD-10-CM | POA: Insufficient documentation

## 2014-08-15 DIAGNOSIS — Z720 Tobacco use: Secondary | ICD-10-CM | POA: Insufficient documentation

## 2014-08-15 MED ORDER — HYDROCODONE-ACETAMINOPHEN 5-325 MG PO TABS: 2.0000 | ORAL_TABLET | ORAL | Status: DC | PRN

## 2014-08-15 MED ORDER — HYDROCODONE-ACETAMINOPHEN 5-325 MG PO TABS
2.0000 | ORAL_TABLET | Freq: Once | ORAL | Status: AC
  Administered 2014-08-15: 2 via ORAL
  Filled 2014-08-15: qty 2

## 2014-08-15 MED ORDER — IBUPROFEN 800 MG PO TABS
800.0000 mg | ORAL_TABLET | Freq: Once | ORAL | Status: AC
  Administered 2014-08-15: 800 mg via ORAL
  Filled 2014-08-15: qty 1

## 2014-08-15 MED ORDER — IBUPROFEN 800 MG PO TABS: 800.0000 mg | ORAL_TABLET | Freq: Three times a day (TID) | ORAL | Status: DC

## 2014-08-15 NOTE — ED Notes (Signed)
Pt twisted her right knee today getting out of the car

## 2014-08-15 NOTE — Discharge Instructions (Signed)
Take ibuprofen and Vicodin as needed for pain. Rest, ice, and elevate your right leg for relief of pain and swelling. Follow up with Dr. Turner Danielsowan if symptoms do not improve.

## 2014-08-15 NOTE — ED Provider Notes (Signed)
CSN: [id number]     Arrival date & time 08/15/14  1927 History   This chart was scribed for Stacy Beck, PA-C working with Azalia Bilis, MD by Elveria Rising, ED Scribe. This patient was seen in room WTR8/WTR8 and the patient's care was started at 8:53 PM.   Chief Complaint  Patient presents with  . Knee Pain   The history is provided by the patient. No language interpreter was used.   HPI Comments: Stacy [name] is a 54 y.o. female who presents to the Emergency Department with right knee pain, onset today. Patient reports that driving she felt a numbing sensation in her knee. She and her husband decided to switch driver/passenger positions and when getting out of the car patient reports that her knee "gave out" and she twisted it. Patient reports an additional occurrence of the same. Patient complains of pain and swelling to anterior knee that is exacerbation with applied pressure and bearing weight. 6/26 patient shares that she received bilateral steriodal injections in her knees. Patient is scheduled to see her ortho 8/1.   Past Medical History  Diagnosis Date  . Hypertension     Pt on lisinopril  . GERD (gastroesophageal reflux disease)     Pt on Protonix daily  . Arthritis     Takes Naprosyn   . Diabetes mellitus     Takes Metformin 750 mg  . Hyperlipidemia   . Asthma   . COPD (chronic obstructive pulmonary disease)   . Shortness of breath     occasional - uses breathing tx at home  . Headache(784.0)     otc meds prn  . Irritable bowel syndrome 11/19/2010   Past Surgical History  Procedure Laterality Date  . Hernia repair    . Cholecystectomy    . Svd       x 2  . Tubal ligation    . Endometrial ablation  10/2010  . Upper gastrointestinal endoscopy  04/28/11   Family History  Problem Relation Age of Onset  . Asthma Son     had as a child  . Heart disease Sister   . Breast cancer Sister   . Hypertension Father   . Cancer Father   . Hypertension Brother     History  Substance Use Topics  . Smoking status: Current Every Day Smoker -- 0.25 packs/day for 10 years    Types: Cigarettes  . Smokeless tobacco: Never Used  . Alcohol Use: No   OB History    No data available     Review of Systems  Constitutional: Negative for fever and chills.  Musculoskeletal: Positive for joint swelling and arthralgias.  Skin: Negative for color change.  Neurological: Negative for weakness and numbness.      Allergies  Other; Aspirin; and Ace inhibitors  Home Medications   Prior to Admission medications   Medication Sig Start Date End Date Taking? Authorizing Provider  acetaminophen-codeine (TYLENOL #3) 300-30 MG per tablet Take 1 tablet by mouth every 4 (four) hours as needed. 07/19/14   Quentin Angst, MD  albuterol (PROVENTIL) (2.5 MG/3ML) 0.083% nebulizer solution Take 3 mLs (2.5 mg total) by nebulization every 4 (four) hours as needed for wheezing. 07/19/14   Quentin Angst, MD  allopurinol (ZYLOPRIM) 100 MG tablet Take 1 tablet (100 mg total) by mouth daily. 07/19/14   Quentin Angst, MD  Blood Glucose Monitoring Suppl (TRUE METRIX METER) DEVI 1 Device by Does not apply route 4 (four) times daily -  before meals and at bedtime. 07/19/14   Quentin Angst, MD  budesonide-formoterol (SYMBICORT) 80-4.5 MCG/ACT inhaler Inhale 2 puffs into the lungs 2 (two) times daily. 07/19/14 07/19/15  Quentin Angst, MD  colchicine 0.6 MG tablet Take 1 tablet (0.6 mg total) by mouth daily. 07/19/14   Quentin Angst, MD  cyclobenzaprine (FLEXERIL) 10 MG tablet Take 1 tablet (10 mg total) by mouth at bedtime. 07/19/14   Quentin Angst, MD  EPINEPHrine 0.3 mg/0.3 mL IJ SOAJ injection Inject 0.3 mLs (0.3 mg total) into the muscle once. 07/19/14   Quentin Angst, MD  gabapentin (NEURONTIN) 300 MG capsule Take 1 capsule (300 mg total) by mouth 3 (three) times daily. 07/19/14   Quentin Angst, MD  glucose blood (TRUE METRIX BLOOD GLUCOSE  TEST) test strip USE AS DIRECTED BY PHYSICIAN 07/31/14   Quentin Angst, MD  hydrOXYzine (ATARAX/VISTARIL) 10 MG tablet Take 1 tablet (10 mg total) by mouth 3 (three) times daily as needed. 07/19/14   Quentin Angst, MD  metFORMIN (GLUCOPHAGE-XR) 750 MG 24 hr tablet Take 1 tablet (750 mg total) by mouth daily with breakfast. 07/19/14   Quentin Angst, MD  metoCLOPramide (REGLAN) 5 MG tablet Take 1 tablet (5 mg total) by mouth every 8 (eight) hours as needed for nausea or vomiting. Patient not taking: Reported on 02/11/2014 05/15/13   Quentin Angst, MD  pantoprazole (PROTONIX) 40 MG tablet Take 1 tablet (40 mg total) by mouth daily. 07/19/14   Quentin Angst, MD  simvastatin (ZOCOR) 20 MG tablet Take 1 tablet (20 mg total) by mouth at bedtime. 07/27/14   Quentin Angst, MD  TRUEPLUS LANCETS 26G MISC 1 each by Does not apply route 4 (four) times daily - after meals and at bedtime. 07/19/14   Quentin Angst, MD  valsartan-hydrochlorothiazide (DIOVAN-HCT) 160-12.5 MG per tablet Take 1 tablet by mouth daily. 07/19/14   Quentin Angst, MD  Vitamin D, Ergocalciferol, (DRISDOL) 50000 UNITS CAPS capsule Take 1 capsule (50,000 Units total) by mouth every 7 (seven) days. 07/19/14   Quentin Angst, MD   Triage Vitals: BP 124/86 mmHg  Pulse 75  Temp(Src) 98.4 F (36.9 C) (Oral)  Resp 18  SpO2 100% Physical Exam  Constitutional: She is oriented to person, place, and time. She appears well-developed and well-nourished. No distress.  HENT:  Head: Normocephalic and atraumatic.  Eyes: Conjunctivae and EOM are normal.  Neck: Neck supple. No tracheal deviation present.  Cardiovascular: Normal rate and regular rhythm.  Exam reveals no gallop and no friction rub.   No murmur heard. Pulmonary/Chest: Effort normal and breath sounds normal. No respiratory distress. She has no wheezes. She has no rales. She exhibits no tenderness.  Abdominal: Soft. There is no tenderness.   Musculoskeletal:  Limited ROM of right knee due to pain. Distal anterior right knee tenderness to palpation. No obvious deformity. No popliteal tenderness to palpation.   Neurological: She is alert and oriented to person, place, and time.  Lower extremity sensation equal and intact bilaterally. Speech is goal-oriented. Moves limbs without ataxia.   Skin: Skin is warm and dry.  Psychiatric: She has a normal mood and affect. Her behavior is normal.  Nursing note and vitals reviewed.   ED Course  Procedures (including critical care time)  COORDINATION OF CARE: 8:57 PM- Discussed treatment plan with patient at bedside and patient agreed to plan.   Labs Review Labs Reviewed - No data to display  Imaging Review Dg Knee Complete 4 Views Right  08/15/2014   CLINICAL DATA:  Right knee injury getting out of car with pain. Unable to bear weight. Initial encounter.  EXAM: RIGHT KNEE - COMPLETE 4+ VIEW  COMPARISON:  07/31/2014  FINDINGS: There is no evidence of fracture or dislocation. There is no evidence of arthropathy or other focal bone abnormality. Moderate knee joint effusion is demonstrated. Soft tissues are unremarkable.  IMPRESSION: Moderate knee joint effusion.  No osseous abnormality identified.   Electronically Signed   By: Myles RosenthalJohn  Stahl M.D.   On: 08/15/2014 20:43   SPLINT APPLICATION Date/Time: 9:01 PM Authorized by: Stacy BeckKaitlyn Tamasha Laplante Consent: Verbal consent obtained. Risks and benefits: risks, benefits and alternatives were discussed Consent given by: patient Splint applied by: nurse Location details: right knee Splint type: knee sleeve Supplies used: knee sleeve Post-procedure: The splinted body part was neurovascularly unchanged following the procedure. Patient tolerance: Patient tolerated the procedure well with no immediate complications.      EKG Interpretation None      MDM   Final diagnoses:  Right knee injury    9:01 PM Patient's xray shows moderate right  knee effusion without acute fracture or any other acute changes. No neurovascular compromise. Patient will have Vicodin and ibuprofen for pain. No other injury.   I personally performed the services described in this documentation, which was scribed in my presence. The recorded information has been reviewed and is accurate.    Stacy BeckKaitlyn Sheilyn Boehlke, PA-C 08/15/14 2103  Azalia BilisKevin Campos, MD 08/16/14 [phone]

## 2014-08-17 ENCOUNTER — Ambulatory Visit (INDEPENDENT_AMBULATORY_CARE_PROVIDER_SITE_OTHER): Payer: Self-pay | Admitting: Family Medicine

## 2014-08-17 ENCOUNTER — Encounter: Payer: Self-pay | Admitting: Family Medicine

## 2014-08-17 VITALS — BP 111/91 | Ht 66.0 in | Wt 169.0 lb

## 2014-08-17 DIAGNOSIS — M25461 Effusion, right knee: Secondary | ICD-10-CM

## 2014-08-17 MED ORDER — METHYLPREDNISOLONE ACETATE 40 MG/ML IJ SUSP
40.0000 mg | Freq: Once | INTRAMUSCULAR | Status: AC
  Administered 2014-08-17: 40 mg via INTRA_ARTICULAR

## 2014-08-21 NOTE — Progress Notes (Signed)
   Subjective:    Patient ID: Stacy [name], female    DOB: Oct 31, 1960, 54 y.o.   MRN: [id number]  HPI  Right knee pain Twisted it getting out of auto. Occurred 2 days ago. Was seen at ED and had x rays. Has continued to have pain, barely ale to bear weight, using crutches. Swollen, stiff, difficulty straightening it secondary to pain. No prior hx knee problems.  Review of Systems Has noted some swelling of knee, no warmth or redness. No calf pain. No fever or sweats or chills.    Objective:   Physical Exam  VSS RIGHT KNEE: small effusion. No redness or warmth. Cannot fully extend or flex secondary to pain. Difficult to perform lachman but anterior drawer seems normal (although small effusion). Calf is soft. Quad muscles symmetrical  US: Moderate knee effusion with some debris noted as well. Quad and patellar tendons Ok. Medial meniscus looks Ok but lateral is somewhat extruded. INJECTION: Patient was given informed consent, signed copy in the chart. Appropriate time out was taken. Area prepped and draped in usual sterile fashion. 1 cc of methylprednisolone 40 mg/ml plus  4 cc of 1% lidocaine without epinephrine was injected into the right knee using a(n) anterior lateral approach. The patient tolerated the procedure well. There were no complications. Post procedure instructions were given.       Assessment & Plan:  Will try CSI and f/u 1 weeks May have meniscal tear--unclear if this is a mechanical block or lack of movement secondary to pain and effusion.If no improvement, consider MRI w concern for meniscal tear with mechanical block.

## 2014-08-22 ENCOUNTER — Encounter (HOSPITAL_COMMUNITY): Payer: Self-pay | Admitting: Emergency Medicine

## 2014-08-22 ENCOUNTER — Emergency Department (HOSPITAL_COMMUNITY)
Admission: EM | Admit: 2014-08-22 | Discharge: 2014-08-23 | Disposition: A | Discharge status: Home / Self Care | Payer: No Typology Code available for payment source | Attending: Emergency Medicine | Admitting: Emergency Medicine

## 2014-08-22 DIAGNOSIS — R252 Cramp and spasm: Secondary | ICD-10-CM

## 2014-08-22 DIAGNOSIS — E119 Type 2 diabetes mellitus without complications: Secondary | ICD-10-CM | POA: Insufficient documentation

## 2014-08-22 DIAGNOSIS — J449 Chronic obstructive pulmonary disease, unspecified: Secondary | ICD-10-CM | POA: Insufficient documentation

## 2014-08-22 DIAGNOSIS — K219 Gastro-esophageal reflux disease without esophagitis: Secondary | ICD-10-CM | POA: Insufficient documentation

## 2014-08-22 DIAGNOSIS — M545 Low back pain, unspecified: Secondary | ICD-10-CM

## 2014-08-22 DIAGNOSIS — E876 Hypokalemia: Secondary | ICD-10-CM

## 2014-08-22 DIAGNOSIS — I10 Essential (primary) hypertension: Secondary | ICD-10-CM | POA: Insufficient documentation

## 2014-08-22 DIAGNOSIS — Z720 Tobacco use: Secondary | ICD-10-CM | POA: Insufficient documentation

## 2014-08-22 DIAGNOSIS — Z7951 Long term (current) use of inhaled steroids: Secondary | ICD-10-CM | POA: Insufficient documentation

## 2014-08-22 DIAGNOSIS — M1990 Unspecified osteoarthritis, unspecified site: Secondary | ICD-10-CM | POA: Insufficient documentation

## 2014-08-22 DIAGNOSIS — E785 Hyperlipidemia, unspecified: Secondary | ICD-10-CM | POA: Insufficient documentation

## 2014-08-22 DIAGNOSIS — Z79899 Other long term (current) drug therapy: Secondary | ICD-10-CM | POA: Insufficient documentation

## 2014-08-22 LAB — COMPREHENSIVE METABOLIC PANEL
ALT: 23 U/L (ref 14–54)
AST: 27 U/L (ref 15–41)
Albumin: 4.5 g/dL (ref 3.5–5.0)
Alkaline Phosphatase: 106 U/L (ref 38–126)
Anion gap: 11 (ref 5–15)
BUN: 16 mg/dL (ref 6–20)
CO2: 28 mmol/L (ref 22–32)
Calcium: 9.4 mg/dL (ref 8.9–10.3)
Chloride: 96 mmol/L — ABNORMAL LOW (ref 101–111)
Creatinine, Ser: 0.88 mg/dL (ref 0.44–1.00)
GFR calc Af Amer: >60 mL/min (ref >60)
GFR calc non Af Amer: >60 mL/min (ref >60)
Glucose, Bld: 94 mg/dL (ref 65–99)
Potassium: 3.3 mmol/L — ABNORMAL LOW (ref 3.5–5.1)
Sodium: 135 mmol/L (ref 135–145)
Total Bilirubin: 0.3 mg/dL (ref 0.3–1.2)
Total Protein: 8 g/dL (ref 6.5–8.1)

## 2014-08-22 LAB — CBC WITH DIFFERENTIAL/PLATELET
Basophils Absolute: 0 10*3/uL (ref 0.0–0.1)
Basophils Relative: 0 % (ref 0–1)
Eosinophils Absolute: 0.1 10*3/uL (ref 0.0–0.7)
Eosinophils Relative: 1 % (ref 0–5)
HCT: 37.5 % (ref 36.0–46.0)
Hemoglobin: 12.8 g/dL (ref 12.0–15.0)
Lymphocytes Relative: 63 % — ABNORMAL HIGH (ref 12–46)
Lymphs Abs: 4.8 10*3/uL — ABNORMAL HIGH (ref 0.7–4.0)
MCH: 27.5 pg (ref 26.0–34.0)
MCHC: 34.1 g/dL (ref 30.0–36.0)
MCV: 80.5 fL (ref 78.0–100.0)
Monocytes Absolute: 0.3 10*3/uL (ref 0.1–1.0)
Monocytes Relative: 5 % (ref 3–12)
Neutro Abs: 2.3 10*3/uL (ref 1.7–7.7)
Neutrophils Relative %: 31 % — ABNORMAL LOW (ref 43–77)
Platelets: 267 10*3/uL (ref 150–400)
RBC: 4.66 MIL/uL (ref 3.87–5.11)
RDW: 14 % (ref 11.5–15.5)
WBC: 7.6 10*3/uL (ref 4.0–10.5)

## 2014-08-22 LAB — CK: Total CK: 446 U/L — ABNORMAL HIGH (ref 38–234)

## 2014-08-22 LAB — CBG MONITORING, ED: Glucose-Capillary: 100 mg/dL — ABNORMAL HIGH (ref 65–99)

## 2014-08-22 MED ORDER — HYDROMORPHONE HCL 1 MG/ML IJ SOLN
1.0000 mg | Freq: Once | INTRAMUSCULAR | Status: AC
  Administered 2014-08-22: 1 mg via INTRAVENOUS
  Filled 2014-08-22: qty 1

## 2014-08-22 MED ORDER — CYCLOBENZAPRINE HCL 5 MG PO TABS: 5.0000 mg | ORAL_TABLET | Freq: Three times a day (TID) | ORAL | Status: DC | PRN

## 2014-08-22 MED ORDER — DIAZEPAM 5 MG/ML IJ SOLN
5.0000 mg | Freq: Once | INTRAMUSCULAR | Status: AC
  Administered 2014-08-22: 5 mg via INTRAVENOUS
  Filled 2014-08-22: qty 2

## 2014-08-22 MED ORDER — SODIUM CHLORIDE 0.9 % IV BOLUS (SEPSIS)
1000.0000 mL | Freq: Once | INTRAVENOUS | Status: AC
  Administered 2014-08-22: 1000 mL via INTRAVENOUS

## 2014-08-22 MED ORDER — POTASSIUM CHLORIDE CRYS ER 20 MEQ PO TBCR
40.0000 meq | EXTENDED_RELEASE_TABLET | Freq: Once | ORAL | Status: AC
  Administered 2014-08-22: 40 meq via ORAL
  Filled 2014-08-22: qty 2

## 2014-08-22 NOTE — ED Notes (Signed)
Patient is writhing around in the bed, obvious cramping noted to bilateral lower extremities.

## 2014-08-22 NOTE — Discharge Instructions (Signed)
Take flexeril as needed for muscle cramps.   Stay hydrated, recheck potassium in a week.   Take motrin 800 mg every 6 hrs for pain.   Follow up with your doctor.   Return to ER if you have worse cramps, severe pain, leg swelling, chest pain.

## 2014-08-22 NOTE — ED Provider Notes (Signed)
CSN: [id number]     Arrival date & time 08/22/14  2139 History   First MD Initiated Contact with Patient 08/22/14 2205     Chief Complaint  Patient presents with  . muscle cramps      (Consider location/radiation/quality/duration/timing/severity/associated sxs/prior Treatment) The history is provided by the patient.  Tami Linamela Grilliot is a 54 y.o. female hx of HTN, GERD, DM here presenting with leg pain. Acute onset of bilateral leg cramps for  Last hour and a half. Denies any fall or injury. Patient states that she has recurrent muscle cramps and elevated CK level. Denies any fever or vomiting or abdominal pain. No history of DVT or PE. She was noted to be very tearful in triage.    Past Medical History  Diagnosis Date  . Hypertension     Pt on lisinopril  . GERD (gastroesophageal reflux disease)     Pt on Protonix daily  . Arthritis     Takes Naprosyn   . Diabetes mellitus     Takes Metformin 750 mg  . Hyperlipidemia   . Asthma   . COPD (chronic obstructive pulmonary disease)   . Shortness of breath     occasional - uses breathing tx at home  . Headache(784.0)     otc meds prn  . Irritable bowel syndrome 11/19/2010   Past Surgical History  Procedure Laterality Date  . Hernia repair    . Cholecystectomy    . Svd       x 2  . Tubal ligation    . Endometrial ablation  10/2010  . Upper gastrointestinal endoscopy  04/28/11   Family History  Problem Relation Age of Onset  . Asthma Son     had as a child  . Heart disease Sister   . Breast cancer Sister   . Hypertension Father   . Cancer Father   . Hypertension Brother    History  Substance Use Topics  . Smoking status: Current Every Day Smoker -- 0.25 packs/day for 10 years    Types: Cigarettes  . Smokeless tobacco: Never Used  . Alcohol Use: No   OB History    No data available     Review of Systems  Musculoskeletal:       Leg cramp   All other systems reviewed and are negative.     Allergies  Other;  Aspirin; and Ace inhibitors  Home Medications   Prior to Admission medications   Medication Sig Start Date End Date Taking? Authorizing Provider  acetaminophen-codeine (TYLENOL #3) 300-30 MG per tablet Take 1 tablet by mouth every 4 (four) hours as needed. 07/19/14  Yes Quentin Angstlugbemiga E Jegede, MD  albuterol (PROVENTIL) (2.5 MG/3ML) 0.083% nebulizer solution Take 3 mLs (2.5 mg total) by nebulization every 4 (four) hours as needed for wheezing. 07/19/14  Yes Quentin Angstlugbemiga E Jegede, MD  allopurinol (ZYLOPRIM) 100 MG tablet Take 1 tablet (100 mg total) by mouth daily. 07/19/14  Yes Quentin Angstlugbemiga E Jegede, MD  Blood Glucose Monitoring Suppl (TRUE METRIX METER) DEVI 1 Device by Does not apply route 4 (four) times daily -  before meals and at bedtime. 07/19/14  Yes Quentin Angstlugbemiga E Jegede, MD  budesonide-formoterol (SYMBICORT) 80-4.5 MCG/ACT inhaler Inhale 2 puffs into the lungs 2 (two) times daily. 07/19/14 07/19/15 Yes Olugbemiga Annitta NeedsE Jegede, MD  colchicine 0.6 MG tablet Take 1 tablet (0.6 mg total) by mouth daily. 07/19/14  Yes Quentin Angstlugbemiga E Jegede, MD  cyclobenzaprine (FLEXERIL) 10 MG tablet Take 1 tablet (10 mg  total) by mouth at bedtime. 07/19/14  Yes Quentin Angst, MD  diazepam (VALIUM) 5 MG tablet TK 1 T PO Q 6 H PRF MUSCLE SPASMS 07/03/14  Yes Historical Provider, MD  EPINEPHrine 0.3 mg/0.3 mL IJ SOAJ injection Inject 0.3 mLs (0.3 mg total) into the muscle once. 07/19/14  Yes Quentin Angst, MD  gabapentin (NEURONTIN) 300 MG capsule Take 1 capsule (300 mg total) by mouth 3 (three) times daily. 07/19/14  Yes Quentin Angst, MD  glucose blood (TRUE METRIX BLOOD GLUCOSE TEST) test strip USE AS DIRECTED BY PHYSICIAN Patient taking differently: 1 each by Other route 5 (five) times daily. USE AS DIRECTED BY PHYSICIAN 07/31/14  Yes Quentin Angst, MD  HYDROcodone-acetaminophen (NORCO/VICODIN) 5-325 MG per tablet Take 2 tablets by mouth every 4 (four) hours as needed. Patient taking differently: Take 2 tablets  by mouth every 4 (four) hours as needed for moderate pain.  08/15/14  Yes Kaitlyn Szekalski, PA-C  hydrOXYzine (ATARAX/VISTARIL) 10 MG tablet Take 1 tablet (10 mg total) by mouth 3 (three) times daily as needed. Patient taking differently: Take 10 mg by mouth 3 (three) times daily as needed for itching.  07/19/14  Yes Quentin Angst, MD  ibuprofen (ADVIL,MOTRIN) 800 MG tablet Take 1 tablet (800 mg total) by mouth 3 (three) times daily. 08/15/14  Yes Kaitlyn Szekalski, PA-C  metFORMIN (GLUCOPHAGE-XR) 750 MG 24 hr tablet Take 1 tablet (750 mg total) by mouth daily with breakfast. 07/19/14  Yes Quentin Angst, MD  metoCLOPramide (REGLAN) 5 MG tablet Take 1 tablet (5 mg total) by mouth every 8 (eight) hours as needed for nausea or vomiting. 05/15/13  Yes Quentin Angst, MD  pantoprazole (PROTONIX) 40 MG tablet Take 1 tablet (40 mg total) by mouth daily. 07/19/14  Yes Quentin Angst, MD  simvastatin (ZOCOR) 20 MG tablet Take 1 tablet (20 mg total) by mouth at bedtime. 07/27/14  Yes Quentin Angst, MD  TRUEPLUS LANCETS 26G MISC 1 each by Does not apply route 4 (four) times daily - after meals and at bedtime. 07/19/14  Yes Quentin Angst, MD  valsartan-hydrochlorothiazide (DIOVAN-HCT) 160-12.5 MG per tablet Take 1 tablet by mouth daily. 07/19/14  Yes Quentin Angst, MD  Vitamin D, Ergocalciferol, (DRISDOL) 50000 UNITS CAPS capsule Take 1 capsule (50,000 Units total) by mouth every 7 (seven) days. Patient taking differently: Take 50,000 Units by mouth every 7 (seven) weeks. Sundays 07/19/14  Yes Olugbemiga E Jegede, MD   BP 160/99 mmHg  Pulse 100  Temp(Src) 97.6 F (36.4 C) (Oral)  Resp 18  SpO2 95% Physical Exam  Constitutional: She is oriented to person, place, and time. She appears well-developed.  Uncomfortable, tearful   HENT:  Head: Normocephalic.  Mouth/Throat: Oropharynx is clear and moist.  Eyes: Conjunctivae are normal. Pupils are equal, round, and reactive to  light.  Neck: Normal range of motion. Neck supple.  Cardiovascular: Normal rate, regular rhythm and normal heart sounds.   Pulmonary/Chest: Effort normal and breath sounds normal. No respiratory distress. She has no wheezes. She has no rales.  Abdominal: Soft. Bowel sounds are normal. She exhibits no distension. There is no tenderness. There is no rebound.  Musculoskeletal:  Bilateral calf muscle tenderness. No edema, bilateral legs symmetrical   Neurological: She is alert and oriented to person, place, and time.  Skin: Skin is warm and dry.  Psychiatric: She has a normal mood and affect. Her behavior is normal. Judgment and thought content normal.  Nursing note and vitals reviewed.   ED Course  Procedures (including critical care time) Labs Review Labs Reviewed  CBC WITH DIFFERENTIAL/PLATELET - Abnormal; Notable for the following:    Neutrophils Relative % 31 (*)    Lymphocytes Relative 63 (*)    Lymphs Abs 4.8 (*)    All other components within normal limits  COMPREHENSIVE METABOLIC PANEL - Abnormal; Notable for the following:    Potassium 3.3 (*)    Chloride 96 (*)    All other components within normal limits  CK - Abnormal; Notable for the following:    Total CK 446 (*)    All other components within normal limits  CBG MONITORING, ED - Abnormal; Notable for the following:    Glucose-Capillary 100 (*)    All other components within normal limits    Imaging Review No results found.   EKG Interpretation None      MDM   Final diagnoses:  None   Adahlia Stembridge is a 54 y.o. female here with bilateral leg cramps. I doubt DVT. Likely muscle cramps.   11:13 PM CK 446, improved than before. K 3.3, supplemented. Pain improved. No calf tenderness now. I doubt DVT. Will dc home with flexeril prn.      Richardean Canal, MD 08/22/14 [phone]

## 2014-08-22 NOTE — ED Notes (Addendum)
Pt from home c/o bilateral leg cramps x 1 1/2 hours ago. Pt moaning and crying in triage. Hx of muscle cramps and elevated CK.

## 2014-08-24 ENCOUNTER — Encounter: Payer: Self-pay | Admitting: Family Medicine

## 2014-08-24 ENCOUNTER — Encounter (INDEPENDENT_AMBULATORY_CARE_PROVIDER_SITE_OTHER): Payer: Self-pay

## 2014-08-24 ENCOUNTER — Ambulatory Visit (INDEPENDENT_AMBULATORY_CARE_PROVIDER_SITE_OTHER): Payer: No Typology Code available for payment source | Admitting: Family Medicine

## 2014-08-24 VITALS — BP 117/72 | Ht 66.0 in | Wt 165.0 lb

## 2014-08-24 DIAGNOSIS — M25461 Effusion, right knee: Secondary | ICD-10-CM

## 2014-08-24 NOTE — Progress Notes (Signed)
   Subjective:    Patient ID: Stacy [name], female    DOB: Apr 25, 1960, 54 y.o.   MRN: [id number]  HPI F/u knee paina nd effusion after twisting injury. CSI at last ov. NO improvement or at least < 10% improvement.    Review of Systems No new sx    Objective:   Physical Exam  Vital signs reviewed. GENERAL: Well developed, well nourished, no acute distress Walking w crutches Knee small effusion. Still cannot fully extend, lacks 20 degrees. Calf is soft. NV intact distally      Assessment & Plan:  I think we need to go ahead w MRI as I supect she has pretty significant meniscal tear--most likely on top of some pre-exissting OA.

## 2014-08-27 ENCOUNTER — Ambulatory Visit
Admission: RE | Admit: 2014-08-27 | Discharge: 2014-08-27 | Disposition: A | Discharge status: Home / Self Care | Payer: No Typology Code available for payment source | Source: Ambulatory Visit | Attending: Family Medicine | Admitting: Family Medicine

## 2014-08-27 DIAGNOSIS — M25461 Effusion, right knee: Secondary | ICD-10-CM

## 2014-08-29 ENCOUNTER — Telehealth: Payer: Self-pay | Admitting: *Deleted

## 2014-08-29 NOTE — Telephone Encounter (Signed)
-----   Message from Lizbeth Bark sent at 08/29/2014  9:59 AM EDT ----- Regarding: phone message Contact: [phone] Pt looking for Knee mri results

## 2014-08-29 NOTE — Telephone Encounter (Signed)
Pt wants her MRI results, let me know what I should tell her or if you will call.

## 2014-08-31 ENCOUNTER — Encounter: Payer: Self-pay | Admitting: *Deleted

## 2014-08-31 ENCOUNTER — Telehealth: Payer: Self-pay | Admitting: Family Medicine

## 2014-08-31 DIAGNOSIS — M25561 Pain in right knee: Secondary | ICD-10-CM

## 2014-08-31 NOTE — Patient Instructions (Signed)
Dr [name] [facility] Tuesday August 2nd at 845am [address] [place], [place], [place] [address] Phone: [phone]

## 2014-08-31 NOTE — Telephone Encounter (Signed)
See phone note

## 2014-08-31 NOTE — Telephone Encounter (Signed)
[place] MRI meniscus in knee was NOT torn , BUT she has really HORRIBLE osteoarthritis--needsortho --likely TKR next step See if she has insurance? [employer] card? Send tio  [facility]? THANKS! Denny Levy

## 2014-09-03 ENCOUNTER — Ambulatory Visit: Payer: Self-pay | Admitting: Sports Medicine

## 2014-09-10 ENCOUNTER — Encounter: Payer: Self-pay | Admitting: Internal Medicine

## 2014-09-10 ENCOUNTER — Ambulatory Visit: Payer: No Typology Code available for payment source | Attending: Internal Medicine | Admitting: Internal Medicine

## 2014-09-10 VITALS — BP 149/80 | HR 61 | Temp 98.2°F | Resp 18 | Ht 66.0 in | Wt 168.0 lb

## 2014-09-10 DIAGNOSIS — I10 Essential (primary) hypertension: Secondary | ICD-10-CM

## 2014-09-10 DIAGNOSIS — E785 Hyperlipidemia, unspecified: Secondary | ICD-10-CM | POA: Insufficient documentation

## 2014-09-10 DIAGNOSIS — K589 Irritable bowel syndrome without diarrhea: Secondary | ICD-10-CM | POA: Insufficient documentation

## 2014-09-10 DIAGNOSIS — M5136 Other intervertebral disc degeneration, lumbar region: Secondary | ICD-10-CM | POA: Insufficient documentation

## 2014-09-10 DIAGNOSIS — J449 Chronic obstructive pulmonary disease, unspecified: Secondary | ICD-10-CM | POA: Insufficient documentation

## 2014-09-10 DIAGNOSIS — E119 Type 2 diabetes mellitus without complications: Secondary | ICD-10-CM

## 2014-09-10 DIAGNOSIS — M4806 Spinal stenosis, lumbar region: Secondary | ICD-10-CM | POA: Insufficient documentation

## 2014-09-10 DIAGNOSIS — M545 Low back pain, unspecified: Secondary | ICD-10-CM

## 2014-09-10 DIAGNOSIS — G8929 Other chronic pain: Secondary | ICD-10-CM | POA: Insufficient documentation

## 2014-09-10 DIAGNOSIS — K219 Gastro-esophageal reflux disease without esophagitis: Secondary | ICD-10-CM | POA: Insufficient documentation

## 2014-09-10 DIAGNOSIS — Z79899 Other long term (current) drug therapy: Secondary | ICD-10-CM | POA: Insufficient documentation

## 2014-09-10 LAB — GLUCOSE, POCT (MANUAL RESULT ENTRY): POC Glucose: 133 mg/dl — AB (ref 70–99)

## 2014-09-10 MED ORDER — ACETAMINOPHEN-CODEINE #3 300-30 MG PO TABS: 1.0000 | ORAL_TABLET | ORAL | Status: DC | PRN

## 2014-09-10 MED ORDER — EPINEPHRINE 0.3 MG/0.3ML IJ SOAJ: 0.3000 mg | Freq: Once | INTRAMUSCULAR | Status: DC

## 2014-09-10 MED ORDER — VALSARTAN-HYDROCHLOROTHIAZIDE 160-12.5 MG PO TABS: 1.0000 | ORAL_TABLET | Freq: Every day | ORAL | Status: DC

## 2014-09-10 NOTE — Progress Notes (Signed)
Patient here for HFU for fall. Patient injured her right leg. Muscle spasm in right knee and weakness in left knee.Pain level 9. Burning sensation on going for 2wks. CBG is 133.  Patient has taken medications this morning. Patient needs refills on Tylenol #3, epipen, colchichine and simvastatin.

## 2014-09-10 NOTE — Patient Instructions (Signed)
Osteoarthritis Osteoarthritis is a disease that causes soreness and inflammation of a joint. It occurs when the cartilage at the affected joint wears down. Cartilage acts as a cushion, covering the ends of bones where they meet to form a joint. Osteoarthritis is the most common form of arthritis. It often occurs in older people. The joints affected most often by this condition include those in the:  Ends of the fingers.  Thumbs.  Neck.  Lower back.  Knees.  Hips. CAUSES  Over time, the cartilage that covers the ends of bones begins to wear away. This causes bone to rub on bone, producing pain and stiffness in the affected joints.  RISK FACTORS Certain factors can increase your chances of having osteoarthritis, including:  Older age.  Excessive body weight.  Overuse of joints.  Previous joint injury. SIGNS AND SYMPTOMS   Pain, swelling, and stiffness in the joint.  Over time, the joint may lose its normal shape.  Small deposits of bone (osteophytes) may grow on the edges of the joint.  Bits of bone or cartilage can break off and float inside the joint space. This may cause more pain and damage. DIAGNOSIS  Your health care provider will do a physical exam and ask about your symptoms. Various tests may be ordered, such as:  X-rays of the affected joint.  An MRI scan.  Blood tests to rule out other types of arthritis.  Joint fluid tests. This involves using a needle to draw fluid from the joint and examining the fluid under a microscope. TREATMENT  Goals of treatment are to control pain and improve joint function. Treatment plans may include:  A prescribed exercise program that allows for rest and joint relief.  A weight control plan.  Pain relief techniques, such as:  Properly applied heat and cold.  Electric pulses delivered to nerve endings under the skin (transcutaneous electrical nerve stimulation [TENS]).  Massage.  Certain nutritional  supplements.  Medicines to control pain, such as:  Acetaminophen.  Nonsteroidal anti-inflammatory drugs (NSAIDs), such as naproxen.  Narcotic or central-acting agents, such as tramadol.  Corticosteroids. These can be given orally or as an injection.  Surgery to reposition the bones and relieve pain (osteotomy) or to remove loose pieces of bone and cartilage. Joint replacement may be needed in advanced states of osteoarthritis. HOME CARE INSTRUCTIONS   Take medicines only as directed by your health care provider.  Maintain a healthy weight. Follow your health care provider's instructions for weight control. This may include dietary instructions.  Exercise as directed. Your health care provider can recommend specific types of exercise. These may include:  Strengthening exercises. These are done to strengthen the muscles that support joints affected by arthritis. They can be performed with weights or with exercise bands to add resistance.  Aerobic activities. These are exercises, such as brisk walking or low-impact aerobics, that get your heart pumping.  Range-of-motion activities. These keep your joints limber.  Balance and agility exercises. These help you maintain daily living skills.  Rest your affected joints as directed by your health care provider.  Keep all follow-up visits as directed by your health care provider. SEEK MEDICAL CARE IF:   Your skin turns red.  You develop a rash in addition to your joint pain.  You have worsening joint pain.  You have a fever along with joint or muscle aches. SEEK IMMEDIATE MEDICAL CARE IF:  You have a significant loss of weight or appetite.  You have night sweats. FOR MORE   INFORMATION   National Institute of Arthritis and Musculoskeletal and Skin Diseases: www.niams.nih.gov  National Institute on Aging: www.nia.nih.gov  American College of Rheumatology: www.rheumatology.org Document Released: 01/19/2005 Document Revised:  06/05/2013 Document Reviewed: 09/26/2012 ExitCare Patient Information 2015 [employer]. This information is not intended to replace advice given to you by your health care provider. Make sure you discuss any questions you have with your health care provider. DASH Eating Plan DASH stands for "Dietary Approaches to Stop Hypertension." The DASH eating plan is a healthy eating plan that has been shown to reduce high blood pressure (hypertension). Additional health benefits may include reducing the risk of type 2 diabetes mellitus, heart disease, and stroke. The DASH eating plan may also help with weight loss. WHAT DO I NEED TO KNOW ABOUT THE DASH EATING PLAN? For the DASH eating plan, you will follow these general guidelines:  Choose foods with a percent daily value for sodium of less than 5% (as listed on the food label).  Use salt-free seasonings or herbs instead of table salt or sea salt.  Check with your health care provider or pharmacist before using salt substitutes.  Eat lower-sodium products, often labeled as "lower sodium" or "no salt added."  Eat fresh foods.  Eat more vegetables, fruits, and low-fat dairy products.  Choose whole grains. Look for the word "whole" as the first word in the ingredient list.  Choose fish and skinless chicken or turkey more often than red meat. Limit fish, poultry, and meat to 6 oz (170 g) each day.  Limit sweets, desserts, sugars, and sugary drinks.  Choose heart-healthy fats.  Limit cheese to 1 oz (28 g) per day.  Eat more home-cooked food and less restaurant, buffet, and fast food.  Limit fried foods.  Cook foods using methods other than frying.  Limit canned vegetables. If you do use them, rinse them well to decrease the sodium.  When eating at a restaurant, ask that your food be prepared with less salt, or no salt if possible. WHAT FOODS CAN I EAT? Seek help from a dietitian for individual calorie needs. Grains Whole grain or whole  wheat bread. Brown rice. Whole grain or whole wheat pasta. Quinoa, bulgur, and whole grain cereals. Low-sodium cereals. Corn or whole wheat flour tortillas. Whole grain cornbread. Whole grain crackers. Low-sodium crackers. Vegetables Fresh or frozen vegetables (raw, steamed, roasted, or grilled). Low-sodium or reduced-sodium tomato and vegetable juices. Low-sodium or reduced-sodium tomato sauce and paste. Low-sodium or reduced-sodium canned vegetables.  Fruits All fresh, canned (in natural juice), or frozen fruits. Meat and Other Protein Products Ground beef (85% or leaner), grass-fed beef, or beef trimmed of fat. Skinless chicken or turkey. Ground chicken or turkey. Pork trimmed of fat. All fish and seafood. Eggs. Dried beans, peas, or lentils. Unsalted nuts and seeds. Unsalted canned beans. Dairy Low-fat dairy products, such as skim or 1% milk, 2% or reduced-fat cheeses, low-fat ricotta or cottage cheese, or plain low-fat yogurt. Low-sodium or reduced-sodium cheeses. Fats and Oils Tub margarines without trans fats. Light or reduced-fat mayonnaise and salad dressings (reduced sodium). Avocado. Safflower, olive, or canola oils. Natural peanut or almond butter. Other Unsalted popcorn and pretzels. The items listed above may not be a complete list of recommended foods or beverages. Contact your dietitian for more options. WHAT FOODS ARE NOT RECOMMENDED? Grains White bread. White pasta. White rice. Refined cornbread. Bagels and croissants. Crackers that contain trans fat. Vegetables Creamed or fried vegetables. Vegetables in a cheese sauce. Regular canned vegetables. Regular   canned tomato sauce and paste. Regular tomato and vegetable juices. Fruits Dried fruits. Canned fruit in light or heavy syrup. Fruit juice. Meat and Other Protein Products Fatty cuts of meat. Ribs, chicken wings, bacon, sausage, bologna, salami, chitterlings, fatback, hot dogs, bratwurst, and packaged luncheon meats. Salted  nuts and seeds. Canned beans with salt. Dairy Whole or 2% milk, cream, half-and-half, and cream cheese. Whole-fat or sweetened yogurt. Full-fat cheeses or blue cheese. Nondairy creamers and whipped toppings. Processed cheese, cheese spreads, or cheese curds. Condiments Onion and garlic salt, seasoned salt, table salt, and sea salt. Canned and packaged gravies. Worcestershire sauce. Tartar sauce. Barbecue sauce. Teriyaki sauce. Soy sauce, including reduced sodium. Steak sauce. Fish sauce. Oyster sauce. Cocktail sauce. Horseradish. Ketchup and mustard. Meat flavorings and tenderizers. Bouillon cubes. Hot sauce. Tabasco sauce. Marinades. Taco seasonings. Relishes. Fats and Oils Butter, stick margarine, lard, shortening, ghee, and bacon fat. Coconut, palm kernel, or palm oils. Regular salad dressings. Other Pickles and olives. Salted popcorn and pretzels. The items listed above may not be a complete list of foods and beverages to avoid. Contact your dietitian for more information. WHERE CAN I FIND MORE INFORMATION? National Heart, Lung, and Blood Institute: www.nhlbi.nih.gov/health/health-topics/topics/dash/ Document Released: 01/08/2011 Document Revised: 06/05/2013 Document Reviewed: 11/23/2012 ExitCare Patient Information 2015 [employer]. This information is not intended to replace advice given to you by your health care provider. Make sure you discuss any questions you have with your health care provider. Hypertension Hypertension, commonly called high blood pressure, is when the force of blood pumping through your arteries is too strong. Your arteries are the blood vessels that carry blood from your heart throughout your body. A blood pressure reading consists of a higher number over a lower number, such as 110/72. The higher number (systolic) is the pressure inside your arteries when your heart pumps. The lower number (diastolic) is the pressure inside your arteries when your heart relaxes.  Ideally you want your blood pressure below 120/80. Hypertension forces your heart to work harder to pump blood. Your arteries may become narrow or stiff. Having hypertension puts you at risk for heart disease, stroke, and other problems.  RISK FACTORS Some risk factors for high blood pressure are controllable. Others are not.  Risk factors you cannot control include:   Race. You may be at higher risk if you are African American.  Age. Risk increases with age.  Gender. Men are at higher risk than women before age 45 years. After age 65, women are at higher risk than men. Risk factors you can control include:  Not getting enough exercise or physical activity.  Being overweight.  Getting too much fat, sugar, calories, or salt in your diet.  Drinking too much alcohol. SIGNS AND SYMPTOMS Hypertension does not usually cause signs or symptoms. Extremely high blood pressure (hypertensive crisis) may cause headache, anxiety, shortness of breath, and nosebleed. DIAGNOSIS  To check if you have hypertension, your health care provider will measure your blood pressure while you are seated, with your arm held at the level of your heart. It should be measured at least twice using the same arm. Certain conditions can cause a difference in blood pressure between your right and left arms. A blood pressure reading that is higher than normal on one occasion does not mean that you need treatment. If one blood pressure reading is high, ask your health care provider about having it checked again. TREATMENT  Treating high blood pressure includes making lifestyle changes and possibly taking medicine.   Living a healthy lifestyle can help lower high blood pressure. You may need to change some of your habits. Lifestyle changes may include:  Following the DASH diet. This diet is high in fruits, vegetables, and whole grains. It is low in salt, red meat, and added sugars.  Getting at least 2 hours of brisk physical  activity every week.  Losing weight if necessary.  Not smoking.  Limiting alcoholic beverages.  Learning ways to reduce stress. If lifestyle changes are not enough to get your blood pressure under control, your health care provider may prescribe medicine. You may need to take more than one. Work closely with your health care provider to understand the risks and benefits. HOME CARE INSTRUCTIONS  Have your blood pressure rechecked as directed by your health care provider.   Take medicines only as directed by your health care provider. Follow the directions carefully. Blood pressure medicines must be taken as prescribed. The medicine does not work as well when you skip doses. Skipping doses also puts you at risk for problems.   Do not smoke.   Monitor your blood pressure at home as directed by your health care provider. SEEK MEDICAL CARE IF:   You think you are having a reaction to medicines taken.  You have recurrent headaches or feel dizzy.  You have swelling in your ankles.  You have trouble with your vision. SEEK IMMEDIATE MEDICAL CARE IF:  You develop a severe headache or confusion.  You have unusual weakness, numbness, or feel faint.  You have severe chest or abdominal pain.  You vomit repeatedly.  You have trouble breathing. MAKE SURE YOU:   Understand these instructions.  Will watch your condition.  Will get help right away if you are not doing well or get worse. Document Released: 01/19/2005 Document Revised: 06/05/2013 Document Reviewed: 11/11/2012 ExitCare Patient Information 2015 [employer]. This information is not intended to replace advice given to you by your health care provider. Make sure you discuss any questions you have with your health care provider.  

## 2014-09-10 NOTE — Progress Notes (Signed)
Patient ID: Stacy [name], female   DOB: March 19, 1960, 54 y.o.   MRN: [id number]   Venetta Knee, is a 54 y.o. female  EXB:[id number]  NUU:[id number]  DOB - 1960-02-07  Chief Complaint  Patient presents with  . Hospitalization Follow-up        Subjective:   Stacy [name] is a 54 y.o. female here today for a follow up visit. Patient has history of hypertension, GERD, diabetes mellitus and chronic bilateral knee pains. Patient has been having frequent falls lately, saying the knees give out on her, she has been seen in the ED twice in the last month because of knee pain. She also follows up with sports medicine and orthopedic surgery, and has been advised of knee replacement but patient has no insurance. Patient currently takes pain medication which includes Tylenol No. 3. Patient also has a history of chronic low back pain with MRI showing degenerative disc disease at L4-L5 as well as mild to moderate spinal stenosis. Patient has no new complaints today. She is requesting pain medications and refill of her other medications. Her other medical conditions include gout that is well controlled and the most recent uric acid was within normal limits. Her diabetes is under control with near normal hemoglobin A1c, most recent being 6.3%. Patient has not taken her blood pressure medication today and also because of pain blood pressure is a little on the high side this morning. Patient currently wears knee braces and walks with the walker.Patient has No headache, No chest pain, No abdominal pain - No Nausea, No new weakness tingling or numbness, No Cough - SOB.  No problems updated.  ALLERGIES: Allergies  Allergen Reactions  . Other Shortness Of Breath    Allergic to perfumes and cleaning products  . Aspirin Nausea Only    unknown  . Ace Inhibitors Other (See Comments)    Cough, wheeze    PAST MEDICAL HISTORY: Past Medical History  Diagnosis Date  . Hypertension     Pt on lisinopril  . GERD  (gastroesophageal reflux disease)     Pt on Protonix daily  . Arthritis     Takes Naprosyn   . Diabetes mellitus     Takes Metformin 750 mg  . Hyperlipidemia   . Asthma   . COPD (chronic obstructive pulmonary disease)   . Shortness of breath     occasional - uses breathing tx at home  . Headache(784.0)     otc meds prn  . Irritable bowel syndrome 11/19/2010    MEDICATIONS AT HOME: Prior to Admission medications   Medication Sig Start Date End Date Taking? Authorizing Provider  acetaminophen-codeine (TYLENOL #3) 300-30 MG per tablet Take 1 tablet by mouth every 4 (four) hours as needed. 09/10/14  Yes Quentin Angst, MD  albuterol (PROVENTIL) (2.5 MG/3ML) 0.083% nebulizer solution Take 3 mLs (2.5 mg total) by nebulization every 4 (four) hours as needed for wheezing. 07/19/14  Yes Quentin Angst, MD  allopurinol (ZYLOPRIM) 100 MG tablet Take 1 tablet (100 mg total) by mouth daily. 07/19/14  Yes Quentin Angst, MD  Blood Glucose Monitoring Suppl (TRUE METRIX METER) DEVI 1 Device by Does not apply route 4 (four) times daily -  before meals and at bedtime. 07/19/14  Yes Quentin Angst, MD  budesonide-formoterol (SYMBICORT) 80-4.5 MCG/ACT inhaler Inhale 2 puffs into the lungs 2 (two) times daily. 07/19/14 07/19/15 Yes Maleeya Peterkin Annitta Needs, MD  colchicine 0.6 MG tablet Take 1 tablet (0.6 mg total) by mouth daily.  07/19/14  Yes Quentin Angst, MD  cyclobenzaprine (FLEXERIL) 5 MG tablet Take 1 tablet (5 mg total) by mouth 3 (three) times daily as needed for muscle spasms. 08/22/14  Yes Richardean Canal, MD  EPINEPHrine 0.3 mg/0.3 mL IJ SOAJ injection Inject 0.3 mLs (0.3 mg total) into the muscle once. 09/10/14  Yes Quentin Angst, MD  gabapentin (NEURONTIN) 300 MG capsule Take 1 capsule (300 mg total) by mouth 3 (three) times daily. 07/19/14  Yes Quentin Angst, MD  glucose blood (TRUE METRIX BLOOD GLUCOSE TEST) test strip USE AS DIRECTED BY PHYSICIAN Patient taking differently: 1  each by Other route 5 (five) times daily. USE AS DIRECTED BY PHYSICIAN 07/31/14  Yes Quentin Angst, MD  HYDROcodone-acetaminophen (NORCO/VICODIN) 5-325 MG per tablet Take 2 tablets by mouth every 4 (four) hours as needed. Patient taking differently: Take 2 tablets by mouth every 4 (four) hours as needed for moderate pain.  08/15/14  Yes Kaitlyn Szekalski, PA-C  hydrOXYzine (ATARAX/VISTARIL) 10 MG tablet Take 1 tablet (10 mg total) by mouth 3 (three) times daily as needed. Patient taking differently: Take 10 mg by mouth 3 (three) times daily as needed for itching.  07/19/14  Yes Quentin Angst, MD  metFORMIN (GLUCOPHAGE-XR) 750 MG 24 hr tablet Take 1 tablet (750 mg total) by mouth daily with breakfast. 07/19/14  Yes Quentin Angst, MD  metoCLOPramide (REGLAN) 5 MG tablet Take 1 tablet (5 mg total) by mouth every 8 (eight) hours as needed for nausea or vomiting. 05/15/13  Yes Quentin Angst, MD  pantoprazole (PROTONIX) 40 MG tablet Take 1 tablet (40 mg total) by mouth daily. 07/19/14  Yes Quentin Angst, MD  simvastatin (ZOCOR) 20 MG tablet Take 1 tablet (20 mg total) by mouth at bedtime. 07/27/14  Yes Quentin Angst, MD  TRUEPLUS LANCETS 26G MISC 1 each by Does not apply route 4 (four) times daily - after meals and at bedtime. 07/19/14  Yes Quentin Angst, MD  valsartan-hydrochlorothiazide (DIOVAN-HCT) 160-12.5 MG per tablet Take 1 tablet by mouth daily. 09/10/14  Yes Quentin Angst, MD  Vitamin D, Ergocalciferol, (DRISDOL) 50000 UNITS CAPS capsule Take 1 capsule (50,000 Units total) by mouth every 7 (seven) days. Patient taking differently: Take 50,000 Units by mouth every 7 (seven) weeks. Sundays 07/19/14  Yes Quentin Angst, MD  diazepam (VALIUM) 5 MG tablet TK 1 T PO Q 6 H PRF MUSCLE SPASMS 07/03/14   Historical Provider, MD  ibuprofen (ADVIL,MOTRIN) 800 MG tablet Take 1 tablet (800 mg total) by mouth 3 (three) times daily. Patient not taking: Reported on 09/10/2014  08/15/14   Emilia Beck, PA-C     Objective:   Filed Vitals:   09/10/14 0917  BP: 149/80  Pulse: 61  Temp: 98.2 F (36.8 C)  TempSrc: Oral  Resp: 18  Height: 5\' 6"  (1.676 m)  Weight: 168 lb (76.204 kg)  SpO2: 100%    Exam General appearance : Awake, alert, not in any distress. Speech Clear. Not toxic looking HEENT: Atraumatic and Normocephalic, pupils equally reactive to light and accomodation Neck: supple, no JVD. No cervical lymphadenopathy.  Chest:Good air entry bilaterally, no added sounds  CVS: S1 S2 regular, no murmurs.  Abdomen: Bowel sounds present, Non tender and not distended with no gaurding, rigidity or rebound. Extremities: Knee braces and plaster cast on both knee joints. B/L Lower Ext shows no edema, both legs are warm to touch. Intact pedal pulses Neurology: Awake alert, and  oriented X 3, CN II-XII intact, Non focal Skin:No Rash  Data Review Lab Results  Component Value Date   HGBA1C 6.30 07/19/2014   HGBA1C 6.7 01/01/2014   HGBA1C 6.5 08/14/2013     Assessment & Plan   1. Type 2 diabetes mellitus without complication  - Glucose (CBG)   Aim for 30 minutes of exercise most days. Rethink what you drink. Water is great! Aim for 2-3 Carb Choices per meal (30-45 grams) +/- 1 either way  Aim for 0-15 Carbs per snack if hungry  Include protein in moderation with your meals and snacks  Consider reading food labels for Total Carbohydrate and Fat Grams of foods  Consider checking BG at alternate times per day  Continue taking medication as directed Be mindful about how much sugar you are adding to beverages and other foods. Try to decrease. Consider splenda. Fruit Punch - find one with no sugar  Measure and decrease portions of carbohydrate foods  Make your plate and don't go back for seconds   2. Bilateral low back pain without sciatica  - acetaminophen-codeine (TYLENOL #3) 300-30 MG per tablet; Take 1 tablet by mouth every 4 (four) hours as  needed.  Dispense: 60 tablet; Refill: 0 - Follow-up for with orthopedic surgery and sports medicine as scheduled.  3. Essential hypertension  - valsartan-hydrochlorothiazide (DIOVAN-HCT) 160-12.5 MG per tablet; Take 1 tablet by mouth daily.  Dispense: 90 tablet; Refill: 3  - We have discussed target BP range and blood pressure goal - I have advised patient to check BP regularly and to call us back or report to clinic if the numbers are consistently higher than 140/90  - We discussed the importance of compliance with medical therapy and DASH diet recommended, consequences of uncontrolled hypertension discussed.  - continue current BP medications  Patient have been counseled extensively about nutrition and exercise Return in about 4 weeks (around 10/08/2014) for Hemoglobin A1C and Follow up, DM, Follow up Pain and comorbidities.  The patient was given clear instructions to go to ER or return to medical center if symptoms don't improve, worsen or new problems develop. The patient verbalized understanding. The patient was told to call to get lab results if they haven't heard anything in the next week.   This note has been created with Education officer, environmental. Any transcriptional errors are unintentional.    Jeanann Lewandowsky, MD, MHA, CPE, FACP, FAAP [facility] and [facility] [place], [place] [phone]   09/10/2014, 10:09 AM

## 2014-09-11 ENCOUNTER — Telehealth: Payer: Self-pay | Admitting: Clinical

## 2014-09-11 NOTE — Telephone Encounter (Signed)
Left HIPPA-compliant message, attempt at f/u w pt.

## 2014-09-11 NOTE — Telephone Encounter (Signed)
Pt returned call, introduction to [facility] services; pt will return in one month to see PCP and [facility]

## 2014-09-14 ENCOUNTER — Ambulatory Visit (INDEPENDENT_AMBULATORY_CARE_PROVIDER_SITE_OTHER): Payer: No Typology Code available for payment source | Admitting: Family Medicine

## 2014-09-14 ENCOUNTER — Encounter: Payer: Self-pay | Admitting: Family Medicine

## 2014-09-14 VITALS — BP 148/73 | HR 79 | Ht 66.0 in | Wt 168.0 lb

## 2014-09-14 DIAGNOSIS — M25562 Pain in left knee: Secondary | ICD-10-CM

## 2014-09-14 MED ORDER — METHYLPREDNISOLONE ACETATE 40 MG/ML IJ SUSP
40.0000 mg | Freq: Once | INTRAMUSCULAR | Status: AC
  Administered 2014-09-14: 40 mg via INTRA_ARTICULAR

## 2014-09-15 NOTE — Progress Notes (Signed)
   Subjective:    Patient ID: Stacy [name], female    DOB: [birth year], 54 y.o.   MRN: [id number]  HPI  Saw ortho re her rigt knee---is in knee immobilizer and has f/u with himnnext week. Considering TKR. LEFT kneeis really botheriong her now, she thinks from overuse. Was trying to get by on crutches but had a coupleof falls, so now is using a friend's wheelcair. She has rx for chair but no finances currently. Wants to know if we can do anything to help left knee. Aching 7/10, feels pain with every step. Stairs the worst. No swelling. Does feel like it gives way occasionally and has contributed to her falls.  Review of Systems Pertinent review of systems: negative for fever or unusual weight change. No erythema of left knee.    Objective:   Physical Exam VS reviewed WN WN NAD sitting in wheelchair, right leg immobilizer ino place KNEE: left: no effusion. No erythema or warmth. No skin lesions FROM, ligamentously intact. Medial jointline tenderness. Mild crepitus on extension. INJECTION: Patient was given informed consent, signed copy in the chart. Appropriate time out was taken. Area prepped and draped in usual sterile fashion. 1 cc of methylprednisolone 80 mg/ml plus  4 cc of 1% lidocaine without epinephrine was injected into the left knee using a(n) anterior medial approach. The patient tolerated the procedure well. There were no complications. Post procedure instructions were given.      Assessment & Plan:  LEFT knee pain--I suspect she has pretty similar issues in this knee and it is reacting top the extra activity burden placed on it.Wil see if we cangive her some pain relief with CSI. I would be happy to write rx for hinged knee brace but she says she cannot afford copay. I did give her some ACE wraps. I think wheelchair igreat idea. She is going to f/u with ortho re her right knee and we will be happy to see her back at any point. Sh eis pretty sure she wants to pursue TKR on right and  I think reasonable choice.

## 2014-09-24 ENCOUNTER — Other Ambulatory Visit (HOSPITAL_COMMUNITY): Payer: Self-pay | Admitting: Specialist

## 2014-09-24 DIAGNOSIS — M545 Low back pain: Secondary | ICD-10-CM

## 2014-10-02 ENCOUNTER — Ambulatory Visit (HOSPITAL_COMMUNITY)
Admission: RE | Admit: 2014-10-02 | Discharge: 2014-10-02 | Disposition: A | Discharge status: Home / Self Care | Payer: Self-pay | Source: Ambulatory Visit | Attending: Specialist | Admitting: Specialist

## 2014-10-02 DIAGNOSIS — M4807 Spinal stenosis, lumbosacral region: Secondary | ICD-10-CM | POA: Insufficient documentation

## 2014-10-02 DIAGNOSIS — M545 Low back pain: Secondary | ICD-10-CM | POA: Insufficient documentation

## 2014-10-02 DIAGNOSIS — M5136 Other intervertebral disc degeneration, lumbar region: Secondary | ICD-10-CM | POA: Insufficient documentation

## 2014-10-02 DIAGNOSIS — M4806 Spinal stenosis, lumbar region: Secondary | ICD-10-CM | POA: Insufficient documentation

## 2014-10-05 ENCOUNTER — Ambulatory Visit: Payer: Self-pay | Attending: Internal Medicine

## 2014-10-11 ENCOUNTER — Encounter: Payer: Self-pay | Admitting: Internal Medicine

## 2014-10-11 ENCOUNTER — Ambulatory Visit: Payer: Self-pay | Attending: Internal Medicine | Admitting: Internal Medicine

## 2014-10-11 VITALS — BP 127/87 | HR 76 | Temp 98.0°F | Resp 16 | Ht 66.0 in | Wt 164.0 lb

## 2014-10-11 DIAGNOSIS — M1000 Idiopathic gout, unspecified site: Secondary | ICD-10-CM

## 2014-10-11 DIAGNOSIS — M545 Low back pain, unspecified: Secondary | ICD-10-CM

## 2014-10-11 DIAGNOSIS — M170 Bilateral primary osteoarthritis of knee: Secondary | ICD-10-CM

## 2014-10-11 DIAGNOSIS — E119 Type 2 diabetes mellitus without complications: Secondary | ICD-10-CM

## 2014-10-11 DIAGNOSIS — M109 Gout, unspecified: Secondary | ICD-10-CM

## 2014-10-11 DIAGNOSIS — I10 Essential (primary) hypertension: Secondary | ICD-10-CM

## 2014-10-11 DIAGNOSIS — K219 Gastro-esophageal reflux disease without esophagitis: Secondary | ICD-10-CM

## 2014-10-11 LAB — POCT GLYCOSYLATED HEMOGLOBIN (HGB A1C): Hemoglobin A1C: 6.4

## 2014-10-11 LAB — GLUCOSE, POCT (MANUAL RESULT ENTRY): POC Glucose: 123 mg/dl — AB (ref 70–99)

## 2014-10-11 MED ORDER — VALSARTAN-HYDROCHLOROTHIAZIDE 160-12.5 MG PO TABS
1.0000 | ORAL_TABLET | Freq: Every day | ORAL | Status: DC
Start: 2014-10-11 — End: 2015-02-21

## 2014-10-11 MED ORDER — VITAMIN D (ERGOCALCIFEROL) 1.25 MG (50000 UNIT) PO CAPS: 50000.0000 [IU] | ORAL_CAPSULE | ORAL | Status: DC

## 2014-10-11 MED ORDER — NICOTINE 21 MG/24HR TD PT24: 21.0000 mg | MEDICATED_PATCH | Freq: Every day | TRANSDERMAL | Status: DC

## 2014-10-11 MED ORDER — CYCLOBENZAPRINE HCL 5 MG PO TABS: 5.0000 mg | ORAL_TABLET | Freq: Three times a day (TID) | ORAL | Status: DC | PRN

## 2014-10-11 MED ORDER — PANTOPRAZOLE SODIUM 40 MG PO TBEC: 40.0000 mg | DELAYED_RELEASE_TABLET | Freq: Every day | ORAL | Status: DC

## 2014-10-11 MED ORDER — METFORMIN HCL ER 750 MG PO TB24: 750.0000 mg | ORAL_TABLET | Freq: Every day | ORAL | Status: DC

## 2014-10-11 MED ORDER — GABAPENTIN 300 MG PO CAPS: 300.0000 mg | ORAL_CAPSULE | Freq: Three times a day (TID) | ORAL | Status: DC

## 2014-10-11 MED ORDER — COLCHICINE 0.6 MG PO TABS: 0.6000 mg | ORAL_TABLET | Freq: Every day | ORAL | Status: DC

## 2014-10-11 MED ORDER — SIMVASTATIN 20 MG PO TABS: 20.0000 mg | ORAL_TABLET | Freq: Every day | ORAL | Status: DC

## 2014-10-11 MED ORDER — ACETAMINOPHEN-CODEINE #3 300-30 MG PO TABS: 1.0000 | ORAL_TABLET | ORAL | Status: DC | PRN

## 2014-10-11 MED ORDER — ALLOPURINOL 100 MG PO TABS: 100.0000 mg | ORAL_TABLET | Freq: Every day | ORAL | Status: DC

## 2014-10-11 NOTE — Progress Notes (Signed)
S:  Patient here for a visit with Dr. Hyman Hopes and I was asked to evaluate/assist with tobacco cessation.   Age when started using tobacco on a daily basis:54 yo. Number of Cigarettes per day 20.   Smokes [employer] first cigarette 1 minute after waking. Denies waking to smoke / Smokes times per night.    Most recent quit attempt 20 years aago Longest time ever been tobacco free: 1 year. What Medications (NRT, bupropion, varenicline) used in past: none.  Rates IMPORTANCE of quitting tobacco on 1-10 scale of 10. Rates READINESS of quitting tobacco on 1-10 scale of 10. Rates CONFIDENCE of quitting tobacco on 1-10 scale of 10. Triggers to use tobacco include: anxiety, anger, meals   Patient is motivated to quit by money and improvement in her health  A/P: Severe Nicotine Dependence of 45 years duration in a patient who is fair candidate for success b/c of dependency (wakes up to smoke) but motivation to quit.     Initiated nicotine replacement tx with nicotine patches 21 mg daily. If patient is successful with this dose, will order the rest of the dosing titration schedule. Patient counseled on purpose, proper use, and potential adverse effects, including skin irritation and sleep disturbances. Stressed to patient the importance of not smoking when wearing the patch. Patient verbalized understanding.  Written information provided.  F/U Rx Clinic Visit in 2 weeks.   Total time in face-to-face counseling 20 minutes.  Patient seen with Sherle Poe, PharmD Resident.

## 2014-10-11 NOTE — Progress Notes (Signed)
F/U DM Glucose running lowest 87 highest 140 Test strip and metformin refills Complaining of Knee pain, has appointment with Ortho on Sep 14 Pain scale #7- Lt Rt Knee Hx tobacco 6 cigarette per day

## 2014-10-11 NOTE — Patient Instructions (Addendum)
We are so happy that you want to try to quit smoking!  We will order the nicotine patches for you, remember to NOT smoke with the patch on  Schedule an appointment with Misty Stanley, our pharmacist, in two weeks for follow up on quitting smoking  You can do this, we are here to help!  Hypertension Hypertension, commonly called high blood pressure, is when the force of blood pumping through your arteries is too strong. Your arteries are the blood vessels that carry blood from your heart throughout your body. A blood pressure reading consists of a higher number over a lower number, such as 110/72. The higher number (systolic) is the pressure inside your arteries when your heart pumps. The lower number (diastolic) is the pressure inside your arteries when your heart relaxes. Ideally you want your blood pressure below 120/80. Hypertension forces your heart to work harder to pump blood. Your arteries may become narrow or stiff. Having hypertension puts you at risk for heart disease, stroke, and other problems.  RISK FACTORS Some risk factors for high blood pressure are controllable. Others are not.  Risk factors you cannot control include:   Race. You may be at higher risk if you are African American.  Age. Risk increases with age.  Gender. Men are at higher risk than women before age 74 years. After age 25, women are at higher risk than men. Risk factors you can control include:  Not getting enough exercise or physical activity.  Being overweight.  Getting too much fat, sugar, calories, or salt in your diet.  Drinking too much alcohol. SIGNS AND SYMPTOMS Hypertension does not usually cause signs or symptoms. Extremely high blood pressure (hypertensive crisis) may cause headache, anxiety, shortness of breath, and nosebleed. DIAGNOSIS  To check if you have hypertension, your health care provider will measure your blood pressure while you are seated, with your arm held at the level of your heart.  It should be measured at least twice using the same arm. Certain conditions can cause a difference in blood pressure between your right and left arms. A blood pressure reading that is higher than normal on one occasion does not mean that you need treatment. If one blood pressure reading is high, ask your health care provider about having it checked again. TREATMENT  Treating high blood pressure includes making lifestyle changes and possibly taking medicine. Living a healthy lifestyle can help lower high blood pressure. You may need to change some of your habits. Lifestyle changes may include:  Following the DASH diet. This diet is high in fruits, vegetables, and whole grains. It is low in salt, red meat, and added sugars.  Getting at least 2 hours of brisk physical activity every week.  Losing weight if necessary.  Not smoking.  Limiting alcoholic beverages.  Learning ways to reduce stress. If lifestyle changes are not enough to get your blood pressure under control, your health care provider may prescribe medicine. You may need to take more than one. Work closely with your health care provider to understand the risks and benefits. HOME CARE INSTRUCTIONS  Have your blood pressure rechecked as directed by your health care provider.   Take medicines only as directed by your health care provider. Follow the directions carefully. Blood pressure medicines must be taken as prescribed. The medicine does not work as well when you skip doses. Skipping doses also puts you at risk for problems.   Do not smoke.   Monitor your blood pressure at home as  directed by your health care provider. SEEK MEDICAL CARE IF:   You think you are having a reaction to medicines taken.  You have recurrent headaches or feel dizzy.  You have swelling in your ankles.  You have trouble with your vision. SEEK IMMEDIATE MEDICAL CARE IF:  You develop a severe headache or confusion.  You have unusual  weakness, numbness, or feel faint.  You have severe chest or abdominal pain.  You vomit repeatedly.  You have trouble breathing. MAKE SURE YOU:   Understand these instructions.  Will watch your condition.  Will get help right away if you are not doing well or get worse. Document Released: 01/19/2005 Document Revised: 06/05/2013 Document Reviewed: 11/11/2012 [facility] Patient Information 2015 [place], [place]. This information is not intended to replace advice given to you by your health care provider. Make sure you discuss any questions you have with your health care provider. DASH Eating Plan DASH stands for "Dietary Approaches to Stop Hypertension." The DASH eating plan is a healthy eating plan that has been shown to reduce high blood pressure (hypertension). Additional health benefits may include reducing the risk of type 2 diabetes mellitus, heart disease, and stroke. The DASH eating plan may also help with weight loss. WHAT DO I NEED TO KNOW ABOUT THE DASH EATING PLAN? For the DASH eating plan, you will follow these general guidelines:  Choose foods with a percent daily value for sodium of less than 5% (as listed on the food label).  Use salt-free seasonings or herbs instead of table salt or sea salt.  Check with your health care provider or pharmacist before using salt substitutes.  Eat lower-sodium products, often labeled as "lower sodium" or "no salt added."  Eat fresh foods.  Eat more vegetables, fruits, and low-fat dairy products.  Choose whole grains. Look for the word "whole" as the first word in the ingredient list.  Choose fish and skinless chicken or [place] more often than red meat. Limit fish, poultry, and meat to 6 oz (170 g) each day.  Limit sweets, desserts, sugars, and sugary drinks.  Choose heart-healthy fats.  Limit cheese to 1 oz (28 g) per day.  Eat more home-cooked food and less restaurant, buffet, and fast food.  Limit fried foods.  Cook foods  using methods other than frying.  Limit canned vegetables. If you do use them, rinse them well to decrease the sodium.  When eating at a restaurant, ask that your food be prepared with less salt, or no salt if possible. WHAT FOODS CAN I EAT? Seek help from a dietitian for individual calorie needs. Grains Whole grain or whole wheat bread. Brown rice. Whole grain or whole wheat pasta. Quinoa, bulgur, and whole grain cereals. Low-sodium cereals. Corn or whole wheat flour tortillas. Whole grain cornbread. Whole grain crackers. Low-sodium crackers. Vegetables Fresh or frozen vegetables (raw, steamed, roasted, or grilled). Low-sodium or reduced-sodium tomato and vegetable juices. Low-sodium or reduced-sodium tomato sauce and paste. Low-sodium or reduced-sodium canned vegetables.  Fruits All fresh, canned (in natural juice), or frozen fruits. Meat and Other Protein Products Ground beef (85% or leaner), grass-fed beef, or beef trimmed of fat. Skinless chicken or [place]. Ground chicken or [place]. Pork trimmed of fat. All fish and seafood. Eggs. Dried beans, peas, or lentils. Unsalted nuts and seeds. Unsalted canned beans. Dairy Low-fat dairy products, such as skim or 1% milk, 2% or reduced-fat cheeses, low-fat ricotta or cottage cheese, or plain low-fat yogurt. Low-sodium or reduced-sodium cheeses. Fats and Oils Tub  margarines without trans fats. Light or reduced-fat mayonnaise and salad dressings (reduced sodium). Avocado. Safflower, olive, or canola oils. Natural peanut or almond butter. Other Unsalted popcorn and pretzels. The items listed above may not be a complete list of recommended foods or beverages. Contact your dietitian for more options. WHAT FOODS ARE NOT RECOMMENDED? Grains White bread. White pasta. White rice. Refined cornbread. Bagels and croissants. Crackers that contain trans fat. Vegetables Creamed or fried vegetables. Vegetables in a cheese sauce. Regular canned vegetables.  Regular canned tomato sauce and paste. Regular tomato and vegetable juices. Fruits Dried fruits. Canned fruit in light or heavy syrup. Fruit juice. Meat and Other Protein Products Fatty cuts of meat. Ribs, chicken wings, bacon, sausage, bologna, salami, chitterlings, fatback, hot dogs, bratwurst, and packaged luncheon meats. Salted nuts and seeds. Canned beans with salt. Dairy Whole or 2% milk, cream, half-and-half, and cream cheese. Whole-fat or sweetened yogurt. Full-fat cheeses or blue cheese. Nondairy creamers and whipped toppings. Processed cheese, cheese spreads, or cheese curds. Condiments Onion and garlic salt, seasoned salt, table salt, and sea salt. Canned and packaged gravies. Worcestershire sauce. Tartar sauce. Barbecue sauce. Teriyaki sauce. Soy sauce, including reduced sodium. Steak sauce. Fish sauce. Oyster sauce. Cocktail sauce. Horseradish. Ketchup and mustard. Meat flavorings and tenderizers. Bouillon cubes. Hot sauce. Tabasco sauce. Marinades. Taco seasonings. Relishes. Fats and Oils Butter, stick margarine, lard, shortening, ghee, and bacon fat. Coconut, palm kernel, or palm oils. Regular salad dressings. Other Pickles and olives. Salted popcorn and pretzels. The items listed above may not be a complete list of foods and beverages to avoid. Contact your dietitian for more information. WHERE CAN I FIND MORE INFORMATION? National Heart, [employer], and [employer] Institute: [email or web address] Document Released: 01/08/2011 Document Revised: 06/05/2013 Document Reviewed: 11/23/2012 [facility] Patient Information 2015 [place], [place]. This information is not intended to replace advice given to you by your health care provider. Make sure you discuss any questions you have with your health care provider. Diabetes and Exercise Exercising regularly is important. It is not just about losing weight. It has many health benefits, such as:  Improving your overall  fitness, flexibility, and endurance.  Increasing your bone density.  Helping with weight control.  Decreasing your body fat.  Increasing your muscle strength.  Reducing stress and tension.  Improving your overall health. People with diabetes who exercise gain additional benefits because exercise:  Reduces appetite.  Improves the body's use of blood sugar (glucose).  Helps lower or control blood glucose.  Decreases blood pressure.  Helps control blood lipids (such as cholesterol and triglycerides).  Improves the body's use of the hormone insulin by:  Increasing the body's insulin sensitivity.  Reducing the body's insulin needs.  Decreases the risk for heart disease because exercising:  Lowers cholesterol and triglycerides levels.  Increases the levels of good cholesterol (such as high-density lipoproteins [HDL]) in the body.  Lowers blood glucose levels. YOUR ACTIVITY PLAN  Choose an activity that you enjoy and set realistic goals. Your health care provider or diabetes educator can help you make an activity plan that works for you. Exercise regularly as directed by your health care provider. This includes:  Performing resistance training twice a week such as push-ups, sit-ups, lifting weights, or using resistance bands.  Performing 150 minutes of cardio exercises each week such as walking, running, or playing sports.  Staying active and spending no more than 90 minutes at one time being inactive. Even short bursts of exercise are good for you. Three 10-minute sessions spread  throughout the day are just as beneficial as a single 30-minute session. Some exercise ideas include:  Taking the dog for a walk.  Taking the stairs instead of the elevator.  Dancing to your favorite song.  Doing an exercise video.  Doing your favorite exercise with a friend. RECOMMENDATIONS FOR EXERCISING WITH TYPE 1 OR TYPE 2 DIABETES   Check your blood glucose before exercising. If  blood glucose levels are greater than 240 mg/dL, check for urine ketones. Do not exercise if ketones are present.  Avoid injecting insulin into areas of the body that are going to be exercised. For example, avoid injecting insulin into:  The arms when playing tennis.  The legs when jogging.  Keep a record of:  Food intake before and after you exercise.  Expected peak times of insulin action.  Blood glucose levels before and after you exercise.  The type and amount of exercise you have done.  Review your records with your health care provider. Your health care provider will help you to develop guidelines for adjusting food intake and insulin amounts before and after exercising.  If you take insulin or oral hypoglycemic agents, watch for signs and symptoms of hypoglycemia. They include:  Dizziness.  Shaking.  Sweating.  Chills.  Confusion.  Drink plenty of water while you exercise to prevent dehydration or heat stroke. Body water is lost during exercise and must be replaced.  Talk to your health care provider before starting an exercise program to make sure it is safe for you. Remember, almost any type of activity is better than none. Document Released: 04/11/2003 Document Revised: 06/05/2013 Document Reviewed: 06/28/2012 [facility] Patient Information 2015 [place], [place]. This information is not intended to replace advice given to you by your health care provider. Make sure you discuss any questions you have with your health care provider. Basic Carbohydrate Counting for Diabetes Mellitus Carbohydrate counting is a method for keeping track of the amount of carbohydrates you eat. Eating carbohydrates naturally increases the level of sugar (glucose) in your blood, so it is important for you to know the amount that is okay for you to have in every meal. Carbohydrate counting helps keep the level of glucose in your blood within normal limits. The amount of carbohydrates allowed is  different for every person. A dietitian can help you calculate the amount that is right for you. Once you know the amount of carbohydrates you can have, you can count the carbohydrates in the foods you want to eat. Carbohydrates are found in the following foods:  Grains, such as breads and cereals.  Dried beans and soy products.  Starchy vegetables, such as potatoes, peas, and corn.  Fruit and fruit juices.  Milk and yogurt.  Sweets and snack foods, such as cake, cookies, candy, chips, soft drinks, and fruit drinks. CARBOHYDRATE COUNTING There are two ways to count the carbohydrates in your food. You can use either of the methods or a combination of both. Reading the "Nutrition Facts" on Packaged Food The "Nutrition Facts" is an area that is included on the labels of almost all packaged food and beverages in the [place]. It includes the serving size of that food or beverage and information about the nutrients in each serving of the food, including the grams (g) of carbohydrate per serving.  Decide the number of servings of this food or beverage that you will be able to eat or drink. Multiply that number of servings by the number of grams of carbohydrate that  is listed on the label for that serving. The total will be the amount of carbohydrates you will be having when you eat or drink this food or beverage. Learning Standard Serving Sizes of Food When you eat food that is not packaged or does not include "Nutrition Facts" on the label, you need to measure the servings in order to count the amount of carbohydrates.A serving of most carbohydrate-rich foods contains about 15 g of carbohydrates. The following list includes serving sizes of carbohydrate-rich foods that provide 15 g ofcarbohydrate per serving:   1 slice of bread (1 oz) or 1 six-inch tortilla.    of a hamburger bun or English muffin.  4-6 crackers.   cup unsweetened dry cereal.    cup hot cereal.   cup rice or  pasta.    cup mashed potatoes or  of a large baked potato.  1 cup fresh fruit or one small piece of fruit.    cup canned or frozen fruit or fruit juice.  1 cup milk.   cup plain fat-free yogurt or yogurt sweetened with artificial sweeteners.   cup cooked dried beans or starchy vegetable, such as peas, corn, or potatoes.  Decide the number of standard-size servings that you will eat. Multiply that number of servings by 15 (the grams of carbohydrates in that serving). For example, if you eat 2 cups of strawberries, you will have eaten 2 servings and 30 g of carbohydrates (2 servings x 15 g = 30 g). For foods such as soups and casseroles, in which more than one food is mixed in, you will need to count the carbohydrates in each food that is included. EXAMPLE OF CARBOHYDRATE COUNTING Sample Dinner  3 oz chicken breast.   cup of brown rice.   cup of corn.  1 cup milk.   1 cup strawberries with sugar-free whipped topping.  Carbohydrate Calculation Step 1: Identify the foods that contain carbohydrates:   Rice.   Corn.   Milk.   Strawberries. Step 2:Calculate the number of servings eaten of each:   2 servings of rice.   1 serving of corn.   1 serving of milk.   1 serving of strawberries. Step 3: Multiply each of those number of servings by 15 g:   2 servings of rice x 15 g = 30 g.   1 serving of corn x 15 g = 15 g.   1 serving of milk x 15 g = 15 g.   1 serving of strawberries x 15 g = 15 g. Step 4: Add together all of the amounts to find the total grams of carbohydrates eaten: 30 g + 15 g + 15 g + 15 g = 75 g. Document Released: 01/19/2005 Document Revised: 06/05/2013 Document Reviewed: 12/16/2012 [facility] Patient Information 2015 [place], [place]. This information is not intended to replace advice given to you by your health care provider. Make sure you discuss any questions you have with your health care provider.

## 2014-10-11 NOTE — Progress Notes (Signed)
Patient ID: Stacy [name], female   DOB: [birth year], 54 y.o.   MRN: [id number]   Stacy [name], is a 54 y.o. female  [id number]  [id number]  DOB - January 11, 1961  No chief complaint on file.       Subjective:   Stacy [name] is a 54 y.o. female with history of hypertension, type 2 diabetes mellitus, hyperlipidemia, COPD and GERD here today for a follow up visit and medication refill. Patient is complaining of knee pain which has been ongoing for months, left greater than right, rated pain at the 7. Patient has been referred to orthopedics for osteoarthritis, appointment coming up if about a week. Patient continued to smoke cigarette, but now down to about 6 cigarettes per day. Blood sugar is now better controlled, home blood sugar ranges between 87 now 140. Blood pressure is also controlled. Patient denies being depressed today. Patient reports no side effects. Patient has No headache, No chest pain, No abdominal pain - No Nausea, No new weakness tingling or numbness, No Cough - SOB.  Problem  Primary Osteoarthritis of Both Knees    ALLERGIES: Allergies  Allergen Reactions  . Other Shortness Of Breath    Allergic to perfumes and cleaning products  . Aspirin Nausea Only    unknown  . Ace Inhibitors Other (See Comments)    Cough, wheeze    PAST MEDICAL HISTORY: Past Medical History  Diagnosis Date  . Hypertension     Pt on lisinopril  . GERD (gastroesophageal reflux disease)     Pt on Protonix daily  . Arthritis     Takes Naprosyn   . Diabetes mellitus     Takes Metformin 750 mg  . Hyperlipidemia   . Asthma   . COPD (chronic obstructive pulmonary disease)   . Shortness of breath     occasional - uses breathing tx at home  . Headache(784.0)     otc meds prn  . Irritable bowel syndrome 11/19/2010    MEDICATIONS AT HOME: Prior to Admission medications   Medication Sig Start Date End Date Taking? Authorizing Provider  acetaminophen-codeine (TYLENOL #3) 300-30 MG  per tablet Take 1 tablet by mouth every 4 (four) hours as needed. 10/11/14   Quentin Angst, MD  albuterol (PROVENTIL) (2.5 MG/3ML) 0.083% nebulizer solution Take 3 mLs (2.5 mg total) by nebulization every 4 (four) hours as needed for wheezing. 07/19/14   Quentin Angst, MD  allopurinol (ZYLOPRIM) 100 MG tablet Take 1 tablet (100 mg total) by mouth daily. 10/11/14   Quentin Angst, MD  Blood Glucose Monitoring Suppl (TRUE METRIX METER) DEVI 1 Device by Does not apply route 4 (four) times daily -  before meals and at bedtime. 07/19/14   Quentin Angst, MD  budesonide-formoterol (SYMBICORT) 80-4.5 MCG/ACT inhaler Inhale 2 puffs into the lungs 2 (two) times daily. 07/19/14 07/19/15  Quentin Angst, MD  colchicine 0.6 MG tablet Take 1 tablet (0.6 mg total) by mouth daily. 10/11/14   Quentin Angst, MD  cyclobenzaprine (FLEXERIL) 5 MG tablet Take 1 tablet (5 mg total) by mouth 3 (three) times daily as needed for muscle spasms. 10/11/14   Quentin Angst, MD  diazepam (VALIUM) 5 MG tablet TK 1 T PO Q 6 H PRF MUSCLE SPASMS 07/03/14   Historical Provider, MD  EPINEPHrine 0.3 mg/0.3 mL IJ SOAJ injection Inject 0.3 mLs (0.3 mg total) into the muscle once. 09/10/14   Quentin Angst, MD  gabapentin (NEURONTIN) 300 MG capsule Take 1  capsule (300 mg total) by mouth 3 (three) times daily. 10/11/14   Quentin Angst, MD  glucose blood (TRUE METRIX BLOOD GLUCOSE TEST) test strip USE AS DIRECTED BY PHYSICIAN Patient taking differently: 1 each by Other route 5 (five) times daily. USE AS DIRECTED BY PHYSICIAN 07/31/14   Quentin Angst, MD  HYDROcodone-acetaminophen (NORCO/VICODIN) 5-325 MG per tablet Take 2 tablets by mouth every 4 (four) hours as needed. Patient taking differently: Take 2 tablets by mouth every 4 (four) hours as needed for moderate pain.  08/15/14   Kaitlyn Szekalski, PA-C  hydrOXYzine (ATARAX/VISTARIL) 10 MG tablet Take 1 tablet (10 mg total) by mouth 3 (three) times daily  as needed. Patient taking differently: Take 10 mg by mouth 3 (three) times daily as needed for itching.  07/19/14   Quentin Angst, MD  ibuprofen (ADVIL,MOTRIN) 800 MG tablet Take 1 tablet (800 mg total) by mouth 3 (three) times daily. Patient not taking: Reported on 09/10/2014 08/15/14   Emilia Beck, PA-C  metFORMIN (GLUCOPHAGE-XR) 750 MG 24 hr tablet Take 1 tablet (750 mg total) by mouth daily with breakfast. 10/11/14   Quentin Angst, MD  metoCLOPramide (REGLAN) 5 MG tablet Take 1 tablet (5 mg total) by mouth every 8 (eight) hours as needed for nausea or vomiting. 05/15/13   Quentin Angst, MD  pantoprazole (PROTONIX) 40 MG tablet Take 1 tablet (40 mg total) by mouth daily. 10/11/14   Quentin Angst, MD  simvastatin (ZOCOR) 20 MG tablet Take 1 tablet (20 mg total) by mouth at bedtime. 10/11/14   Quentin Angst, MD  TRUEPLUS LANCETS 26G MISC 1 each by Does not apply route 4 (four) times daily - after meals and at bedtime. 07/19/14   Quentin Angst, MD  valsartan-hydrochlorothiazide (DIOVAN-HCT) 160-12.5 MG per tablet Take 1 tablet by mouth daily. 10/11/14   Quentin Angst, MD  Vitamin D, Ergocalciferol, (DRISDOL) 50000 UNITS CAPS capsule Take 1 capsule (50,000 Units total) by mouth every 7 (seven) weeks. Sundays 10/11/14   Quentin Angst, MD     Objective:   Filed Vitals:   10/11/14 1015  BP: 127/87  Pulse: 76  Temp: 98 F (36.7 C)  TempSrc: Oral  Resp: 16  Height: 5\' 6"  (1.676 m)  Weight: 164 lb (74.39 kg)  SpO2: 98%    Exam General appearance : Awake, alert, not in any distress. Speech Clear. Not toxic looking, poor dentition HEENT: Atraumatic and Normocephalic, pupils equally reactive to light and accomodation Neck: supple, no JVD. No cervical lymphadenopathy.  Chest:Good air entry bilaterally, no added sounds  CVS: S1 S2 regular, no murmurs.  Abdomen: Bowel sounds present, Non tender and not distended with no gaurding, rigidity or  rebound. Extremities: B/L Lower Ext shows no edema, both legs are warm to touch. Knee braces and support in situ Neurology: Awake alert, and oriented X 3, CN II-XII intact, Non focal Skin:No Rash  Data Review Lab Results  Component Value Date   HGBA1C 6.40 10/11/2014   HGBA1C 6.30 07/19/2014   HGBA1C 6.7 01/01/2014     Assessment & Plan   1. Type 2 diabetes mellitus without complication  - POCT glucose (manual entry) - POCT glycosylated hemoglobin (Hb A1C) - gabapentin (NEURONTIN) 300 MG capsule; Take 1 capsule (300 mg total) by mouth 3 (three) times daily.  Dispense: 270 capsule; Refill: 3 - metFORMIN (GLUCOPHAGE-XR) 750 MG 24 hr tablet; Take 1 tablet (750 mg total) by mouth daily with breakfast.  Dispense: 90  tablet; Refill: 3 - simvastatin (ZOCOR) 20 MG tablet; Take 1 tablet (20 mg total) by mouth at bedtime.  Dispense: 90 tablet; Refill: 3  Aim for 30 minutes of exercise most days. Rethink what you drink. Water is great! Aim for 2-3 Carb Choices per meal (30-45 grams) +/- 1 either way  Aim for 0-15 Carbs per snack if hungry  Include protein in moderation with your meals and snacks  Consider reading food labels for Total Carbohydrate and Fat Grams of foods  Consider checking BG at alternate times per day  Continue taking medication as directed Be mindful about how much sugar you are adding to beverages and other foods. Fruit Punch - find one with no sugar  Measure and decrease portions of carbohydrate foods  Make your plate and don't go back for seconds   2. Primary osteoarthritis of both knees - acetaminophen-codeine (TYLENOL #3) 300-30 MG per tablet; Take 1 tablet by mouth every 4 (four) hours as needed.  Dispense: 90 tablet; Refill: 0  Follow up with orthopedic surgery on Sept 14, 2016 as scheduled  3. Essential hypertension  - valsartan-hydrochlorothiazide (DIOVAN-HCT) 160-12.5 MG per tablet; Take 1 tablet by mouth daily.  Dispense: 90 tablet; Refill: 3  We  have discussed target BP range and blood pressure goal. I have advised patient to check BP regularly and to call us back or report to clinic if the numbers are consistently higher than 140/90. We discussed the importance of compliance with medical therapy and DASH diet recommended, consequences of uncontrolled hypertension discussed.  - continue current BP medications  4. Bilateral low back pain without sciatica  - acetaminophen-codeine (TYLENOL #3) 300-30 MG per tablet; Take 1 tablet by mouth every 4 (four) hours as needed.  Dispense: 90 tablet; Refill: 0 - cyclobenzaprine (FLEXERIL) 5 MG tablet; Take 1 tablet (5 mg total) by mouth 3 (three) times daily as needed for muscle spasms.  Dispense: 30 tablet; Refill: 3 - Vitamin D, Ergocalciferol, (DRISDOL) 50000 UNITS CAPS capsule; Take 1 capsule (50,000 Units total) by mouth every 7 (seven) weeks. Sundays  Dispense: 12 capsule; Refill: 0  5. Gout of big toe  - allopurinol (ZYLOPRIM) 100 MG tablet; Take 1 tablet (100 mg total) by mouth daily.  Dispense: 90 tablet; Refill: 3 - colchicine 0.6 MG tablet; Take 1 tablet (0.6 mg total) by mouth daily.  Dispense: 90 tablet; Refill: 3  6. Gastroesophageal reflux disease without esophagitis  - pantoprazole (PROTONIX) 40 MG tablet; Take 1 tablet (40 mg total) by mouth daily.  Dispense: 90 tablet; Refill: 3  Patient have been counseled extensively about nutrition and exercise  Return in about 3 months (around 01/10/2015) for Hemoglobin A1C and Follow up, DM, Flu Shot, Follow up HTN.  The patient was given clear instructions to go to ER or return to medical center if symptoms don't improve, worsen or new problems develop. The patient verbalized understanding. The patient was told to call to get lab results if they haven't heard anything in the next week.   This note has been created with Education officer, environmental. Any transcriptional errors are unintentional.    Jeanann Lewandowsky, MD, MHA, Maxwell Caul, CPE [facility] and [facility] [place], [place] [phone]   10/11/2014, 10:47 AM

## 2014-10-16 ENCOUNTER — Ambulatory Visit: Payer: Medicaid Other | Attending: Internal Medicine | Admitting: Physical Therapy

## 2014-10-16 DIAGNOSIS — M25661 Stiffness of right knee, not elsewhere classified: Secondary | ICD-10-CM | POA: Diagnosis present

## 2014-10-16 DIAGNOSIS — M25662 Stiffness of left knee, not elsewhere classified: Secondary | ICD-10-CM | POA: Diagnosis present

## 2014-10-16 DIAGNOSIS — R609 Edema, unspecified: Secondary | ICD-10-CM | POA: Diagnosis present

## 2014-10-16 DIAGNOSIS — R269 Unspecified abnormalities of gait and mobility: Secondary | ICD-10-CM

## 2014-10-16 DIAGNOSIS — M25561 Pain in right knee: Secondary | ICD-10-CM | POA: Diagnosis present

## 2014-10-16 DIAGNOSIS — R2681 Unsteadiness on feet: Secondary | ICD-10-CM | POA: Insufficient documentation

## 2014-10-16 DIAGNOSIS — M25562 Pain in left knee: Secondary | ICD-10-CM | POA: Insufficient documentation

## 2014-10-16 DIAGNOSIS — R531 Weakness: Secondary | ICD-10-CM | POA: Diagnosis present

## 2014-10-16 NOTE — Patient Instructions (Signed)
  Quad Set   With other leg bent, foot flat, slowly tighten muscles on thigh of straight leg while counting out loud to _10___. Repeat with other leg. Repeat _10-20__ times. Do _4-5___ sessions per day.     Hip Flexion / Knee Extension: Straight-Leg Raise (Eccentric)   Lie on back. Lift leg with knee straight. Slowly lower leg for 3-5 seconds. _10__ reps per set, _1-3__  4-5 times per day.  Heel Slide   Bend left knee and pull heel toward buttocks. Use sheet, strap, or rope to actively assist knee further into flexion. Hold stretch 10 secs.  Repeat __10__ times. Do _4-5 ___ sessions per day.  Solon Palm, PT 10/16/2014 12:37 PM [facility] [address] [place], [place], [address] Phone: [phone]   Fax:  [phone]

## 2014-10-16 NOTE — Therapy (Signed)
[facility] [address] [place], [place], [address] Phone: [phone]   Fax:  [phone]  Physical Therapy Evaluation  Patient Details  Name: Stacy [name] MRN: [id number] Date of Birth: Jun 13, 1960 Referring Provider:  Quentin Angst, MD  Encounter Date: 10/16/2014      PT End of Session - 10/16/14 1154    Visit Number 1   Number of Visits 24   Date for PT Re-Evaluation 01/08/15   PT Start Time 1150   PT Stop Time 1252   PT Time Calculation (min) 62 min   Activity Tolerance Patient limited by pain   Behavior During Therapy [facility] for tasks assessed/performed      Past Medical History  Diagnosis Date  . Hypertension     Pt on lisinopril  . GERD (gastroesophageal reflux disease)     Pt on Protonix daily  . Arthritis     Takes Naprosyn   . Diabetes mellitus     Takes Metformin 750 mg  . Hyperlipidemia   . Asthma   . COPD (chronic obstructive pulmonary disease)   . Shortness of breath     occasional - uses breathing tx at home  . Headache(784.0)     otc meds prn  . Irritable bowel syndrome 11/19/2010    Past Surgical History  Procedure Laterality Date  . Hernia repair    . Cholecystectomy    . Svd       x 2  . Tubal ligation    . Endometrial ablation  10/2010  . Upper gastrointestinal endoscopy  04/28/11    There were no vitals filed for this visit.  Visit Diagnosis:  Knee pain, bilateral - Plan: PT plan of care cert/re-cert  Unsteadiness - Plan: PT plan of care cert/re-cert  Stiffness of both knees - Plan: PT plan of care cert/re-cert  Abnormality of gait - Plan: PT plan of care cert/re-cert  Generalized weakness - Plan: PT plan of care cert/re-cert  Edema - Plan: PT plan of care cert/re-cert      Subjective Assessment - 10/16/14 1155    Subjective On 08/15/14 patient was walking around her car and her R knee gave out and she fell. It gave out again at home and at ER. Patient reports she had  a torn meniscus and stress fx of R lateral femoral condyle. Patient sees Dr. [name] tomorrow regarding her back as pain may be coming from then too. She presently is wearing an immobilizer on the left, but the right is giving out again. She originally wore the immobilizer on the right, but then the L knee started to give out. She uses a rollator walker at all times. She has pain in both knees intermittently and has had multiple falls in the past six months..    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 Yes   Pain Score 8    Pain Location Knee   Pain Orientation Right;Left   Pain Descriptors / Indicators Burning   Pain Type Chronic pain   Pain Radiating Towards sometimes her toes go numb bil   Pain Onset More than a month ago   Pain Frequency Constant   Aggravating Factors  nothing   Pain Relieving Factors rest, ice   Effect of Pain on Daily Activities unable to walk safely            [facility]  PT Assessment - 10/16/14 0001    Assessment   Medical Diagnosis Bil PFPS, hyperlaxity R lateral femoral condyle stress fx.   Onset Date/Surgical Date 08/15/14   Next MD Visit 10/17/14   Prior Therapy none   Precautions   Precautions Knee;Fall   Precaution Comments FALL RISK, STAY WITH PT AT ALL TIMES/ GAIT BELT   Required Braces or Orthoses Knee Immobilizer - Left   Balance Screen   Has the patient fallen in the past 6 months Yes   How many times? 10  Last fall 10/15/14   Has the patient had a decrease in activity level because of a fear of falling?  Yes   Is the patient reluctant to leave their home because of a fear of falling?  Yes   Home Environment   Living Environment Private residence   Living Arrangements Spouse/significant other   Available Help at Discharge Family   Type of Home House   Home Access Stairs to enter   Entrance Stairs-Number of  Steps 2   Entrance Stairs-Rails None   Home Layout Two level   Alternate Level Stairs-Number of Steps 13   Alternate Level Stairs-Rails Left   Home Equipment Walker - 4 wheels;Walker - standard   Additional Comments difficulty getting out of tub   Prior Function   Level of Independence Needs assistance with ADLs;Needs assistance with transfers  needs assistance out of tub/ on stairs   Vocation Unemployed   Cognition   Overall Cognitive Status Within Functional Limits for tasks assessed   Observation/Other Assessments   Focus on Therapeutic Outcomes (FOTO)  70% limited   Observation/Other Assessments-Edema    Edema Circumferential   Circumferential Edema   Circumferential - Right 36.5 cm  in sitting at knee joint line   Circumferential - Left  35 cm   ROM / Strength   AROM / PROM / Strength AROM;Strength   AROM   AROM Assessment Site Hip;Knee   Right/Left Hip Right;Left   Right/Left Knee Right;Left   Right Knee Extension 0   Right Knee Flexion 98  120   Left Knee Extension 0   Left Knee Flexion 105  122   Strength   Overall Strength Comments unable to maintain heel raise on right, weakness in R ankle evertors and PF   Strength Assessment Site Hip;Knee   Right/Left Hip Right;Left   Right Hip Flexion --  5-/5   Right Hip Extension 4/5   Right Hip ABduction 5/5  in sitting   Right Hip ADduction 5/5   Left Hip Flexion 5/5   Left Hip Extension 4/5   Left Hip ABduction 5/5  in sitting   Left Hip ADduction 5/5   Right/Left Knee Right;Left   Right Knee Extension --  5-/5   Left Knee Extension --  5-/5   Palpation   Palpation comment marked tenderess of bil knees at joint line ant and post, MCL/LCL, patellar tendon   Special Tests    Special Tests Knee Special Tests   Knee Special tests  Lateral Pull Sign;Patellofemoral Grind Test (Clarke's Sign)   Lateral Pull Sign    Findings Positive   Side Right;Left   Patellofemoral Grind test (Clark's Sign)   Findings Postive    Side  Right   Ambulation/Gait   Ambulation Distance (Feet) 40 Feet   Assistive device Rolling walker   Gait Pattern Step-through pattern;Decreased dorsiflexion - right;Decreased dorsiflexion - left;Decreased weight shift to left   Ambulation Surface Level   Gait Comments  Fall Risk                           PT Education - 10/16/14 1402    Education provided Yes   Education Details HEP: QS, SLR, heel slides. Patient and spouse educated on safest method for ascending stairs with walker and CGA. Educated regardng McConnell tape precautions and removal.    Person(s) Educated Patient;Spouse   Methods Explanation;Demonstration;Tactile cues;Verbal cues;Handout   Comprehension Verbalized understanding;Returned demonstration          PT Short Term Goals - 10/16/14 1419    PT SHORT TERM GOAL #1   Title I with initial HEP   Baseline 11/09/14   Time 3   Period Weeks   Status New   PT SHORT TERM GOAL #2   Title no falls for previous one week   Baseline 11/16/14   Time 4   Period Weeks   Status New   PT SHORT TERM GOAL #3   Title able to demonstrate safe stair climbing with one rail and CGA of spouse   Baseline 11/16/14   Time 4   Period Weeks           PT Long Term Goals - 10/16/14 1420    PT LONG TERM GOAL #1   Title I with advanced HEP   Time 12   Period Weeks   Status New   PT LONG TERM GOAL #2   Title no falls in previous 4 weeks   Baseline 12/14/14   Time 8   Period --   Status New   PT LONG TERM GOAL #3   Title decreased bil knee pain by 50% with ADLS   Time 12   Period Weeks   Status New   PT LONG TERM GOAL #4   Title improved BLE strength to 4+/5 or better to improve function   Time 12   Period Weeks   Status New               Plan - 10/16/14 1405    Clinical Impression Statement Patient is a 54 year old female who presents with bil knee pain and patellar hypernobility. She also has a history of 10+ falls in the past six  months. She ambulates with a rollator walker and a knee immobilizer on the LLE. She reports multiple incidences of her knees giving out. This occurred at the end of the evaluation and the patient fell to her knees. She denied injury. Patient has marked crepitus with knee extension and demonstrates patellar lateral glide bil. She had some relief with McConnel taping. She demonsrates bil ankle PF weakness and weakness of R ankle evertors as well. She will benefit from PT to reduce fall risk, decrease pain and increase strength to improve function.   Pt will benefit from skilled therapeutic intervention in order to improve on the following deficits Difficulty walking;Hypermobility;Pain;Decreased mobility;Decreased strength;Increased edema   Rehab Potential Good   PT Frequency 2x / week   PT Duration 12 weeks   PT Treatment/Interventions ADLs/Self Care Home Management;Electrical Stimulation;Cryotherapy;Iontophoresis 4mg /ml Dexamethasone;Moist Heat;Stair training;Gait training;Therapeutic exercise;Manual techniques;Orthotic Fit/Training;Patient/family education;Neuromuscular re-education;Passive range of motion;Taping;Vasopneumatic Device   PT Next Visit Plan Gait belt at all times: assess taping/modify prn; ankle/knee strengthening; trial of amb with heel lift mimicking AFO to determine if orthotic may be beneficial.   PT Home Exercise Plan QS, SLR, heel slide   Consulted and Agree with Plan of Care Patient  Problem List Patient Active Problem List   Diagnosis Date Noted  . Primary osteoarthritis of both knees 10/11/2014  . Bilateral knee pain 07/31/2014  . Asthma, chronic 07/19/2014  . Gout of big toe 07/19/2014  . Midline low back pain with right-sided sciatica 01/01/2014  . Bilateral low back pain without sciatica 08/14/2013  . Essential hypertension 08/14/2013  . Gastroesophageal reflux disease without esophagitis 08/14/2013  . COPD exacerbation 08/14/2013  . Colon cancer screening  08/14/2013  . Low back pain 05/15/2013  . Preventative health care 05/15/2013  . GERD (gastroesophageal reflux disease) 02/16/2013  . DM (diabetes mellitus) 02/16/2013  . COPD (chronic obstructive pulmonary disease) 04/17/2011  . Hypertension 04/17/2011  . Cough 04/16/2011    Solon Palm PT  10/16/2014, 2:39 PM  [facility] [address] [place], [place], [address] Phone: [phone]   Fax:  [phone]

## 2014-10-18 ENCOUNTER — Ambulatory Visit: Payer: Medicaid Other | Admitting: Physical Therapy

## 2014-10-18 DIAGNOSIS — M25561 Pain in right knee: Secondary | ICD-10-CM

## 2014-10-18 DIAGNOSIS — R2681 Unsteadiness on feet: Secondary | ICD-10-CM

## 2014-10-18 DIAGNOSIS — M25662 Stiffness of left knee, not elsewhere classified: Secondary | ICD-10-CM

## 2014-10-18 DIAGNOSIS — R609 Edema, unspecified: Secondary | ICD-10-CM

## 2014-10-18 DIAGNOSIS — M25562 Pain in left knee: Secondary | ICD-10-CM

## 2014-10-18 DIAGNOSIS — R269 Unspecified abnormalities of gait and mobility: Secondary | ICD-10-CM

## 2014-10-18 DIAGNOSIS — M25661 Stiffness of right knee, not elsewhere classified: Secondary | ICD-10-CM

## 2014-10-18 DIAGNOSIS — R531 Weakness: Secondary | ICD-10-CM

## 2014-10-18 NOTE — Patient Instructions (Signed)
Remove tape if irritating 

## 2014-10-18 NOTE — Therapy (Signed)
[place], [place], [address] Phone: [phone]   Fax:  [phone]  Physical Therapy Treatment  Patient Details  Name: Stacy [name] MRN: [id number] Date of Birth: July 18, 1960 Referring Provider:  Tresa Garter, MD  Encounter Date: 10/18/2014      PT End of Session - 10/18/14 1313    Visit Number 2   Number of Visits 24   Date for PT Re-Evaluation 01/08/15   PT Start Time 0935   PT Stop Time 1030   PT Time Calculation (min) 55 min   Activity Tolerance Patient tolerated treatment well;Patient limited by pain   Behavior During Therapy [facility] for tasks assessed/performed      Past Medical History  Diagnosis Date  . Hypertension     Pt on lisinopril  . GERD (gastroesophageal reflux disease)     Pt on Protonix daily  . Arthritis     Takes Naprosyn   . Diabetes mellitus     Takes Metformin 750 mg  . Hyperlipidemia   . Asthma   . COPD (chronic obstructive pulmonary disease)   . Shortness of breath     occasional - uses breathing tx at home  . Headache(784.0)     otc meds prn  . Irritable bowel syndrome 11/19/2010    Past Surgical History  Procedure Laterality Date  . Hernia repair    . Cholecystectomy    . Svd       x 2  . Tubal ligation    . Endometrial ablation  10/2010  . Upper gastrointestinal endoscopy  04/28/11    There were no vitals filed for this visit.  Visit Diagnosis:  Unsteadiness  Knee pain, bilateral  Stiffness of both knees  Abnormality of gait  Generalized weakness  Edema      Subjective Assessment - 10/18/14 1324    Pain Score 8    Pain Location Knee   Pain Orientation Left;Right                         [facility] Adult PT Treatment/Exercise - 10/18/14 0935    Ambulation/Gait   Gait Comments Trial of heel lifts  both   Self-Care   Self-Care --  heel lift, edema, tape, walking safety, fall risks   Knee/Hip Exercises: Supine   Quad  Sets 10 reps   Heel Slides AAROM  pops painful   Heel Slides Limitations painful   Straight Leg Raises AAROM  10 reps, unable to control core enought to avoid back pain   Cryotherapy   Number Minutes Cryotherapy 15 Minutes   Cryotherapy Location Knee  RT   Type of Cryotherapy --  Cold pack   Vasopneumatic   Number Minutes Vasopneumatic  15 minutes   Vasopnuematic Location  Knee  LT   Vasopneumatic Pressure --  moderate   Vasopneumatic Temperature  32   Manual Therapy   Manual therapy comments taping for patellar tracking.                PT Education - 10/18/14 1328    Education provided Yes   Education Details self care items   Person(s) Educated Patient   Methods Explanation   Comprehension Verbalized understanding          PT Short Term Goals - 10/16/14 1419    PT SHORT TERM GOAL #1   Title I with initial HEP   Baseline 11/09/14   Time 3  Period Weeks   Status New   PT SHORT TERM GOAL #2   Title no falls for previous one week   Baseline 11/16/14   Time 4   Period Weeks   Status New   PT SHORT TERM GOAL #3   Title able to demonstrate safe stair climbing with one rail and CGA of spouse   Baseline 11/16/14   Time 4   Period Weeks           PT Long Term Goals - 10/16/14 1420    PT LONG TERM GOAL #1   Title I with advanced HEP   Time 12   Period Weeks   Status New   PT LONG TERM GOAL #2   Title no falls in previous 4 weeks   Baseline 12/14/14   Time 8   Period --   Status New   PT LONG TERM GOAL #3   Title decreased bil knee pain by 50% with ADLS   Time 12   Period Weeks   Status New   PT LONG TERM GOAL #4   Title improved BLE strength to 4+/5 or better to improve function   Time 12   Period Weeks   Status New               Plan - 10/18/14 1320    Clinical Impression Statement Tape helpful.  Pain is so bad today she could hardly exercise.  Trial of heel lifts helped walking feel better as suggested bt her PT.  No new  goals met.  Immobilizer is still on LT knee ,  Other knee has brace allowing flexion.   PT Next Visit Plan Gait belt at all times: assess taping/modify prn; ankle/knee strengthening; trial of amb with heel lift mimicking AFO to determine if orthotic may be beneficial.   Consulted and Agree with Plan of Care Patient        Problem List Patient Active Problem List   Diagnosis Date Noted  . Primary osteoarthritis of both knees 10/11/2014  . Bilateral knee pain 07/31/2014  . Asthma, chronic 07/19/2014  . Gout of big toe 07/19/2014  . Midline low back pain with right-sided sciatica 01/01/2014  . Bilateral low back pain without sciatica 08/14/2013  . Essential hypertension 08/14/2013  . Gastroesophageal reflux disease without esophagitis 08/14/2013  . COPD exacerbation 08/14/2013  . Colon cancer screening 08/14/2013  . Low back pain 05/15/2013  . Preventative health care 05/15/2013  . GERD (gastroesophageal reflux disease) 02/16/2013  . DM (diabetes mellitus) 02/16/2013  . COPD (chronic obstructive pulmonary disease) 04/17/2011  . Hypertension 04/17/2011  . Cough 04/16/2011    Shadd Dunstan 10/18/2014, 1:29 PM  [facility] [address] [place], [place], [address] Phone: [phone]   Fax:  [phone]     Melvenia Needles, PTA 10/18/2014 1:29 PM Phone: [phone] Fax: [phone]

## 2014-10-25 ENCOUNTER — Encounter (HOSPITAL_BASED_OUTPATIENT_CLINIC_OR_DEPARTMENT_OTHER): Payer: Self-pay | Admitting: Clinical

## 2014-10-25 ENCOUNTER — Encounter (HOSPITAL_COMMUNITY): Payer: Self-pay

## 2014-10-25 ENCOUNTER — Ambulatory Visit: Payer: Medicaid Other | Attending: Internal Medicine | Admitting: Pharmacist

## 2014-10-25 ENCOUNTER — Emergency Department (HOSPITAL_COMMUNITY)
Admission: EM | Admit: 2014-10-25 | Discharge: 2014-10-26 | Disposition: A | Discharge status: Home / Self Care | Payer: Medicaid Other | Attending: Emergency Medicine | Admitting: Emergency Medicine

## 2014-10-25 DIAGNOSIS — Z7951 Long term (current) use of inhaled steroids: Secondary | ICD-10-CM | POA: Insufficient documentation

## 2014-10-25 DIAGNOSIS — M545 Low back pain, unspecified: Secondary | ICD-10-CM

## 2014-10-25 DIAGNOSIS — Z720 Tobacco use: Secondary | ICD-10-CM | POA: Insufficient documentation

## 2014-10-25 DIAGNOSIS — Z79899 Other long term (current) drug therapy: Secondary | ICD-10-CM | POA: Insufficient documentation

## 2014-10-25 DIAGNOSIS — Z23 Encounter for immunization: Secondary | ICD-10-CM | POA: Diagnosis not present

## 2014-10-25 DIAGNOSIS — E119 Type 2 diabetes mellitus without complications: Secondary | ICD-10-CM | POA: Insufficient documentation

## 2014-10-25 DIAGNOSIS — M1990 Unspecified osteoarthritis, unspecified site: Secondary | ICD-10-CM | POA: Insufficient documentation

## 2014-10-25 DIAGNOSIS — I10 Essential (primary) hypertension: Secondary | ICD-10-CM | POA: Diagnosis not present

## 2014-10-25 DIAGNOSIS — F411 Generalized anxiety disorder: Secondary | ICD-10-CM

## 2014-10-25 DIAGNOSIS — E785 Hyperlipidemia, unspecified: Secondary | ICD-10-CM | POA: Insufficient documentation

## 2014-10-25 DIAGNOSIS — J449 Chronic obstructive pulmonary disease, unspecified: Secondary | ICD-10-CM | POA: Diagnosis not present

## 2014-10-25 DIAGNOSIS — K219 Gastro-esophageal reflux disease without esophagitis: Secondary | ICD-10-CM | POA: Insufficient documentation

## 2014-10-25 DIAGNOSIS — F17200 Nicotine dependence, unspecified, uncomplicated: Secondary | ICD-10-CM

## 2014-10-25 MED ORDER — KETOROLAC TROMETHAMINE 30 MG/ML IJ SOLN
30.0000 mg | Freq: Once | INTRAMUSCULAR | Status: AC
  Administered 2014-10-25: 30 mg via INTRAMUSCULAR
  Filled 2014-10-25: qty 1

## 2014-10-25 MED ORDER — INFLUENZA VAC SPLIT QUAD 0.5 ML IM SUSY: 0.5000 mL | PREFILLED_SYRINGE | INTRAMUSCULAR | Status: DC

## 2014-10-25 MED ORDER — DIAZEPAM 2 MG PO TABS
2.0000 mg | ORAL_TABLET | Freq: Once | ORAL | Status: AC
  Administered 2014-10-25: 2 mg via ORAL
  Filled 2014-10-25: qty 1

## 2014-10-25 MED ORDER — INFLUENZA VAC SPLIT QUAD 0.5 ML IM SUSY
0.5000 mL | PREFILLED_SYRINGE | Freq: Once | INTRAMUSCULAR | Status: AC
  Administered 2014-10-25: 0.5 mL via INTRAMUSCULAR

## 2014-10-25 NOTE — ED Notes (Signed)
Pt got a steroid injection yesterday because of lower extremity pain and now she complains of pain where the injection was and above that

## 2014-10-25 NOTE — Progress Notes (Signed)
S:  Patient arrives talkative and in a pleasant mood. Reports for follow up visit regarding smoking cessation.   Age when started using tobacco on a daily basis:54 yo. Number of Cigarettes per day 2.   Smokes [employer] first cigarette 1 minute after waking. Denies waking to smoke / Smokes times per night.    Most recent quit attempt 20 years aago Longest time ever been tobacco free: 1 year. What Medications (NRT, bupropion, varenicline) used in past: none.  Rates IMPORTANCE of quitting tobacco on 1-10 scale of 10. Rates READINESS of quitting tobacco on 1-10 scale of 10. Rates CONFIDENCE of quitting tobacco on 1-10 scale of 10. Triggers to use tobacco include: anxiety, anger, meals   Patient is motivated to quit by saving money and improvement in her health  Has reduced smoking to only two cigarettes daily and states chewing gum or eating hard candy has helped her combat cravings. She has all discarded all of her ashtrays at home and has started using crossword/jigsaw puzzles to help keep her occupied. She is waiting to get the patches once her husband gets paid.   A/P: Severe Nicotine Dependence of 45 years duration in a patient who is good candidate for success b/c of dependency (wakes up to smoke) but highly motivated to quit and is making great progress by getting down to 2 cigarettes daily.  Patient will follow up in clinic once she can afford the patches - will start if she starts smoking more but otherwise, will likely have patient cut back without pharmacological assistance, otherwise, she will get too much nicotine from the patch. Will assess patient and determine appropriate treatment at next visit. Congratulated patient on current progress . Patient also requested a flu shot which was administered during visit.  Written information provided. F/U Rx Clinic Visit in 2 weeks.  Total time in face-to-face counseling 20 minutes. Patient seen with Sherle Poe, PharmD Resident.

## 2014-10-25 NOTE — ED Provider Notes (Signed)
CSN: [id number]     Arrival date & time 10/25/14  2204 History  This chart was scribed for  Gerhard Munch, MD by [name], ED Scribe. This patient was seen in room WA09/WA09 and the patient's care was started at 11:05 PM.    Chief Complaint  Patient presents with  . Back Pain     The history is provided by the patient. No language interpreter was used.   Stacy [name] is a 54 y.o. female with PMHx of arthritis, chronic back pain, and DM who presents to the Emergency Department complaining of back pain that worsened yesterday after a cortisone injection.  She has chronic lower back pain since a fall years ago where she broke her tail bone. Yesterday she had a cortisone injection in the back and notes that she was unable to sleep due to persistent discomfort in the back diffusely. Pt rates the pain 9/10 in severity. Associated symptoms include nausea and hyperglycemia (notes elevated blood sugar last night). Also complains of knee and hip pain. Pt denies fever, new abdominal pain, vomiting, incontinence of bowel or bladder, and change in sensation.     Past Medical History  Diagnosis Date  . Hypertension     Pt on lisinopril  . GERD (gastroesophageal reflux disease)     Pt on Protonix daily  . Arthritis     Takes Naprosyn   . Diabetes mellitus     Takes Metformin 750 mg  . Hyperlipidemia   . Asthma   . COPD (chronic obstructive pulmonary disease)   . Shortness of breath     occasional - uses breathing tx at home  . Headache(784.0)     otc meds prn  . Irritable bowel syndrome 11/19/2010   Past Surgical History  Procedure Laterality Date  . Hernia repair    . Cholecystectomy    . Svd       x 2  . Tubal ligation    . Endometrial ablation  10/2010  . Upper gastrointestinal endoscopy  04/28/11   Family History  Problem Relation Age of Onset  . Asthma Son     had as a child  . Heart disease Sister   . Breast cancer Sister   . Hypertension Father   . Cancer Father    . Hypertension Brother    Social History  Substance Use Topics  . Smoking status: Current Every Day Smoker -- 0.25 packs/day for 10 years    Types: Cigarettes  . Smokeless tobacco: Never Used  . Alcohol Use: No   OB History    No data available     Review of Systems  Constitutional:       Per HPI, otherwise negative  HENT:       Per HPI, otherwise negative  Respiratory:       Per HPI, otherwise negative  Cardiovascular:       Per HPI, otherwise negative  Gastrointestinal: Negative for vomiting.  Endocrine:       Negative aside from HPI  Genitourinary:       Neg aside from HPI   Musculoskeletal:       Per HPI, otherwise negative  Skin: Negative.   Neurological: Negative for syncope.      Allergies  Other; Aspirin; and Ace inhibitors  Home Medications   Prior to Admission medications   Medication Sig Start Date End Date Taking? Authorizing Provider  albuterol (PROVENTIL) (2.5 MG/3ML) 0.083% nebulizer solution Take 3 mLs (2.5 mg total) by nebulization  every 4 (four) hours as needed for wheezing. 07/19/14  Yes Quentin Angst, MD  allopurinol (ZYLOPRIM) 100 MG tablet Take 1 tablet (100 mg total) by mouth daily. 10/11/14  Yes Quentin Angst, MD  Blood Glucose Monitoring Suppl (TRUE METRIX METER) DEVI 1 Device by Does not apply route 4 (four) times daily -  before meals and at bedtime. 07/19/14  Yes Quentin Angst, MD  budesonide-formoterol (SYMBICORT) 80-4.5 MCG/ACT inhaler Inhale 2 puffs into the lungs 2 (two) times daily. 07/19/14 07/19/15 Yes Olugbemiga Annitta Needs, MD  colchicine 0.6 MG tablet Take 1 tablet (0.6 mg total) by mouth daily. 10/11/14  Yes Quentin Angst, MD  gabapentin (NEURONTIN) 300 MG capsule Take 1 capsule (300 mg total) by mouth 3 (three) times daily. 10/11/14  Yes Quentin Angst, MD  glucose blood (TRUE METRIX BLOOD GLUCOSE TEST) test strip USE AS DIRECTED BY PHYSICIAN 07/31/14  Yes Quentin Angst, MD  hydrOXYzine (ATARAX/VISTARIL)  10 MG tablet Take 1 tablet (10 mg total) by mouth 3 (three) times daily as needed. Patient taking differently: Take 10 mg by mouth 3 (three) times daily as needed for itching.  07/19/14  Yes Quentin Angst, MD  metFORMIN (GLUCOPHAGE-XR) 750 MG 24 hr tablet Take 1 tablet (750 mg total) by mouth daily with breakfast. 10/11/14  Yes Quentin Angst, MD  metoCLOPramide (REGLAN) 5 MG tablet Take 1 tablet (5 mg total) by mouth every 8 (eight) hours as needed for nausea or vomiting. 05/15/13  Yes Quentin Angst, MD  pantoprazole (PROTONIX) 40 MG tablet Take 1 tablet (40 mg total) by mouth daily. 10/11/14  Yes Quentin Angst, MD  simvastatin (ZOCOR) 20 MG tablet Take 1 tablet (20 mg total) by mouth at bedtime. 10/11/14  Yes Quentin Angst, MD  TRUEPLUS LANCETS 26G MISC 1 each by Does not apply route 4 (four) times daily - after meals and at bedtime. 07/19/14  Yes Quentin Angst, MD  valsartan-hydrochlorothiazide (DIOVAN-HCT) 160-12.5 MG per tablet Take 1 tablet by mouth daily. 10/11/14  Yes Quentin Angst, MD  Vitamin D, Ergocalciferol, (DRISDOL) 50000 UNITS CAPS capsule Take 1 capsule (50,000 Units total) by mouth every 7 (seven) weeks. Sundays 10/11/14  Yes Olugbemiga Annitta Needs, MD  acetaminophen-codeine (TYLENOL #3) 300-30 MG per tablet Take 1 tablet by mouth every 4 (four) hours as needed. Patient not taking: Reported on 10/25/2014 10/11/14   Quentin Angst, MD  cyclobenzaprine (FLEXERIL) 5 MG tablet Take 1 tablet (5 mg total) by mouth 3 (three) times daily as needed for muscle spasms. Patient not taking: Reported on 10/25/2014 10/11/14   Quentin Angst, MD  EPINEPHrine 0.3 mg/0.3 mL IJ SOAJ injection Inject 0.3 mLs (0.3 mg total) into the muscle once. Patient not taking: Reported on 10/25/2014 09/10/14   Quentin Angst, MD  HYDROcodone-acetaminophen (NORCO/VICODIN) 5-325 MG per tablet Take 2 tablets by mouth every 4 (four) hours as needed. Patient not taking: Reported on  10/16/2014 08/15/14   Emilia Beck, PA-C  ibuprofen (ADVIL,MOTRIN) 800 MG tablet Take 1 tablet (800 mg total) by mouth 3 (three) times daily. Patient not taking: Reported on 09/10/2014 08/15/14   Emilia Beck, PA-C  nicotine (NICODERM CQ - DOSED IN MG/24 HOURS) 21 mg/24hr patch Place 1 patch (21 mg total) onto the skin daily. Patient not taking: Reported on 10/16/2014 10/11/14   Quentin Angst, MD   BP 147/91 mmHg  Pulse 76  Temp(Src) 98 F (36.7 C) (Oral)  Resp 20  SpO2 97% Physical Exam  Constitutional: She is oriented to person, place, and time. She appears well-developed and well-nourished. No distress.  HENT:  Head: Normocephalic and atraumatic.  Eyes: Conjunctivae and EOM are normal.  Cardiovascular: Normal rate and regular rhythm.   Pulmonary/Chest: Effort normal and breath sounds normal. No stridor. No respiratory distress.  Abdominal: She exhibits no distension.  Musculoskeletal: She exhibits no edema.  Referred pain in the lower back with hip flexion bilaterally (R>L). Injection site mildly tender with no erythema or deformity.  Neurological: She is alert and oriented to person, place, and time. No cranial nerve deficit.  Skin: Skin is warm and dry.  Psychiatric: She has a normal mood and affect.  Nursing note and vitals reviewed.   ED Course  Procedures (including critical care time)  COORDINATION OF CARE: 11:11 PM Discussed treatment plan which includes lab work and pain management with pt at bedside and pt agreed to plan.  12:46 AM Patient sleeping, awakens easily. She, her son and I discussed all findings, the need to follow-up with orthopedist, return precautions.   MDM    I personally performed the services described in this documentation, which was scribed in my presence. The recorded information has been reviewed and is accurate.   This patient with a history of back pain, now one day after cortisone injection presents with ongoing back  pain. Patient has no evidence for new neurologic dysfunction, though she is in substantial discomfort on arrival. Patient had resolution of her pain, is moving all extremity spontaneously, has no fever, or other concerning findings suggesting to space infection, epidural abscess. With her improvement, she is discharged in stable condition to follow-up with her orthopedist.   Gerhard Munch, MD 10/26/14 [phone]

## 2014-10-25 NOTE — Progress Notes (Signed)
ASSESSMENT: Pt currently experiencing symptoms of generalized anxiety disorder. Pt needs to f/u with PCP and [facility], and would benefit from psychoeducation and supportive counseling regarding coping with symptoms of anxiety.  Stage of Change: contemplative  PLAN: 1. F/U with behavioral health consultant in as needed 2. Psychiatric Medications: neurontin. 3. Behavioral recommendation(s):   -Practice daily relaxation exercises, as practiced in office visit -Consider reading educational material regarding coping with symptoms of anxiety and panic attacks SUBJECTIVE: Pt. referred by Caroline Sauger for symptoms of anxiety:  Pt. reports the following symptoms/concerns: Pt states that she feels anxious when she is in the car, and that she has racing thoughts and worries that keep her from going to sleep and that wake her up too early. Her health stresses her out and she does not know what she can do to cope with the worrying.  Duration of problem: over a year Severity: moderate  OBJECTIVE: Orientation & Cognition: Oriented x3. Thought processes normal and appropriate to situation. Mood: appropriate. Affect: appropriate Appearance: appropriate Risk of harm to self or others: no risk of harm to self or others Substance use: tobacco Assessments administered: none  Diagnosis: Generalized anxiety disorder CPT Code: F41.1 -------------------------------------------- Other(s) present in the room: none  Time spent with patient in exam room: 20 minutes

## 2014-10-25 NOTE — Patient Instructions (Signed)
Great to see you again! You're doing very well!! Make a follow up appointment in the pharmacy clinic [facility])Influenza Virus Vaccine injection  What is this medicine? INFLUENZA VIRUS VACCINE (in floo EN zuh VAHY ruhs vak SEEN) helps to reduce the risk of getting influenza also known as the flu. The vaccine only helps protect you against some strains of the flu. This medicine may be used for other purposes; ask your health care provider or pharmacist if you have questions. COMMON BRAND NAME(S): Afluria, Agriflu, Fluarix, Fluarix Quadrivalent, FLUCELVAX, Flulaval, Fluvirin, Fluzone, Fluzone High-Dose, Fluzone Intradermal What should I tell my health care provider before I take this medicine? They need to know if you have any of these conditions: -bleeding disorder like hemophilia -fever or infection -Guillain-Barre syndrome or other neurological problems -immune system problems -infection with the human immunodeficiency virus (HIV) or AIDS -low blood platelet counts -multiple sclerosis -an unusual or allergic reaction to influenza virus vaccine, latex, other medicines, foods, dyes, or preservatives. Different brands of vaccines contain different allergens. Some may contain latex or eggs. Talk to your doctor about your allergies to make sure that you get the right vaccine. -pregnant or trying to get pregnant -breast-feeding How should I use this medicine? This vaccine is for injection into a muscle or under the skin. It is given by a health care professional. A copy of Vaccine Information Statements will be given before each vaccination. Read this sheet carefully each time. The sheet may change frequently. Talk to your healthcare provider to see which vaccines are right for you. Some vaccines should not be used in all age groups. Overdosage: If you think you have taken too much of this medicine contact a poison control center or emergency room at once. NOTE: This medicine is only for you. Do  not share this medicine with others. What if I miss a dose? This does not apply. What may interact with this medicine? -chemotherapy or radiation therapy -medicines that lower your immune system like etanercept, anakinra, infliximab, and adalimumab -medicines that treat or prevent blood clots like warfarin -phenytoin -steroid medicines like prednisone or cortisone -theophylline -vaccines This list may not describe all possible interactions. Give your health care provider a list of all the medicines, herbs, non-prescription drugs, or dietary supplements you use. Also tell them if you smoke, drink alcohol, or use illegal drugs. Some items may interact with your medicine. What should I watch for while using this medicine? Report any side effects that do not go away within 3 days to your doctor or health care professional. Call your health care provider if any unusual symptoms occur within 6 weeks of receiving this vaccine. You may still catch the flu, but the illness is not usually as bad. You cannot get the flu from the vaccine. The vaccine will not protect against colds or other illnesses that may cause fever. The vaccine is needed every year. What side effects may I notice from receiving this medicine? Side effects that you should report to your doctor or health care professional as soon as possible: -allergic reactions like skin rash, itching or hives, swelling of the face, lips, or tongue Side effects that usually do not require medical attention (report to your doctor or health care professional if they continue or are bothersome): -fever -headache -muscle aches and pains -pain, tenderness, redness, or swelling at the injection site -tiredness This list may not describe all possible side effects. Call your doctor for medical advice about side effects. You may report  side effects to FDA at 1-800-FDA-1088. Where should I keep my medicine? The vaccine will be given by a health care  professional in a clinic, pharmacy, doctor's office, or other health care setting. You will not be given vaccine doses to store at home. NOTE: This sheet is a summary. It may not cover all possible information. If you have questions about this medicine, talk to your doctor, pharmacist, or health care provider.  2015, [employer]. (2011-07-30 13:08:28)  the week you plan to pick up your patches

## 2014-10-26 MED ORDER — IBUPROFEN 800 MG PO TABS: 800.0000 mg | ORAL_TABLET | Freq: Three times a day (TID) | ORAL | Status: AC

## 2014-10-26 MED ORDER — DIAZEPAM 2 MG PO TABS: 2.0000 mg | ORAL_TABLET | Freq: Three times a day (TID) | ORAL | Status: AC

## 2014-10-26 NOTE — Discharge Instructions (Signed)
As discussed, your evaluation today has been largely reassuring.  But, it is important that you monitor your condition carefully, and do not hesitate to return to the ED if you develop new, or concerning changes in your condition. ? ?Otherwise, please follow-up with your physician for appropriate ongoing care. ? ?

## 2014-10-30 ENCOUNTER — Ambulatory Visit: Payer: Medicaid Other | Admitting: Physical Therapy

## 2014-10-30 DIAGNOSIS — M25562 Pain in left knee: Secondary | ICD-10-CM

## 2014-10-30 DIAGNOSIS — R2681 Unsteadiness on feet: Secondary | ICD-10-CM

## 2014-10-30 DIAGNOSIS — M25561 Pain in right knee: Secondary | ICD-10-CM

## 2014-10-30 DIAGNOSIS — R269 Unspecified abnormalities of gait and mobility: Secondary | ICD-10-CM

## 2014-10-30 DIAGNOSIS — R609 Edema, unspecified: Secondary | ICD-10-CM

## 2014-10-30 DIAGNOSIS — M25661 Stiffness of right knee, not elsewhere classified: Secondary | ICD-10-CM

## 2014-10-30 DIAGNOSIS — R531 Weakness: Secondary | ICD-10-CM

## 2014-10-30 DIAGNOSIS — M25662 Stiffness of left knee, not elsewhere classified: Secondary | ICD-10-CM

## 2014-10-30 NOTE — Therapy (Signed)
[place], [place], [address] Phone: [phone]   Fax:  [phone]  Physical Therapy Treatment  Patient Details  Name: Stacy [name] MRN: [id number] Date of Birth: [birth year] Referring Provider:  [name], MD  Encounter Date: 10/30/2014      PT End of Session - 10/30/14 1017    Visit Number 3   Number of Visits 24   Date for PT Re-Evaluation 01/08/15   PT Start Time 0930   PT Stop Time 1030   PT Time Calculation (min) 60 min   Activity Tolerance Patient tolerated treatment well;Patient limited by pain   Behavior During Therapy [facility] for tasks assessed/performed      Past Medical History  Diagnosis Date  . Hypertension     Pt on lisinopril  . GERD (gastroesophageal reflux disease)     Pt on Protonix daily  . Arthritis     Takes Naprosyn   . Diabetes mellitus     Takes Metformin 750 mg  . Hyperlipidemia   . Asthma   . COPD (chronic obstructive pulmonary disease)   . Shortness of breath     occasional - uses breathing tx at home  . Headache(784.0)     otc meds prn  . Irritable bowel syndrome 11/19/2010    Past Surgical History  Procedure Laterality Date  . Hernia repair    . Cholecystectomy    . Svd       x 2  . Tubal ligation    . Endometrial ablation  10/2010  . Upper gastrointestinal endoscopy  04/28/11    There were no vitals filed for this visit.  Visit Diagnosis:  Unsteadiness  Knee pain, bilateral  Stiffness of both knees  Abnormality of gait  Generalized weakness  Edema      Subjective Assessment - 10/30/14 0938    Subjective Had an epidural last week, then had an allergic reaction to it the next morning , Had  a severe HA, a panic attack, had to go to the ER. She has been having a hard time.  Back pain is a little better with less pressure 3/10.     Currently in Pain? Yes   Pain Score 7   up to 9/10   Pain Location Knee   Pain Orientation Right;Left   Pain Descriptors / Indicators Aching;Pins and needles;Throbbing;Spasm  spikes, does not know when   Pain Radiating Towards toes, calf   Aggravating Factors  pain wakes her up at night   Pain Relieving Factors braces, heel lifts in shoes                         [facility] Adult PT Treatment/Exercise - 10/30/14 0945    Self-Care   Self-Care --  RICE reviewed again, discussed swelling effects what to do   Knee/Hip Exercises: Supine   Quad Sets 10 reps   Short Arc Quad Sets 10 reps  with ball squeeze.  6/10 pain   Heel Slides AAROM  Lt stiff painful   Straight Leg Raises AAROM;Both;1 set;10 reps   Vasopneumatic   Number Minutes Vasopneumatic  15 minutes   Vasopnuematic Location  Knee   Vasopneumatic Pressure Medium   Vasopneumatic Temperature  32  cold pack to Lt knee, Vaso to RT   Manual Therapy   Manual therapy comments taping to prevent lateral tracking both  PT Education - 10/30/14 1017    Education provided Yes   Education Details RICE, Edema reviewed, explained, tracking info reviewed   Person(s) Educated Patient   Methods Explanation;Demonstration;Verbal cues   Comprehension Verbalized understanding          PT Short Term Goals - 10/30/14 1356    PT SHORT TERM GOAL #1   Title I with initial HEP   Baseline knows how to do , non compliant this past week   Time 3   Period Weeks   Status On-going   PT SHORT TERM GOAL #2   Title no falls for previous one week   Baseline fell 2 X Saturday   Time 4   Period Weeks   Status On-going   PT SHORT TERM GOAL #3   Title able to demonstrate safe stair climbing with one rail and CGA of spouse   Baseline says she is safe with at home with her spouse   Time 4   Period Weeks   Status Partially Met           PT Long Term Goals - 10/30/14 1357    PT LONG TERM GOAL #1   Title I with advanced HEP   Time 12   Period Weeks   Status On-going   PT LONG TERM GOAL #2   Title no falls in  previous 4 weeks   Baseline still falling   Time 8   Status On-going   PT LONG TERM GOAL #3   Title decreased bil knee pain by 50% with ADLS   Baseline knee pain improved 30%   Time 12   Period Weeks   Status On-going   PT LONG TERM GOAL #4   Title improved BLE strength to 4+/5 or better to improve function   Time 12   Period Weeks   Status On-going               Plan - 10/30/14 1018    Clinical Impression Statement Patient has complications from epidural, may need to get a clot placed in her back.  Knees stiff from inactivity.  Pain improved in knees 30 %.  She fell 2 X last Saturday.   PT Next Visit Plan Gait belt at all times: assess taping/modify prn; ankle/knee strengthening; trial of amb with heel lift mimicking AFO to determine if orthotic may be beneficial.   Consulted and Agree with Plan of Care Patient        Problem List Patient Active Problem List   Diagnosis Date Noted  . Primary osteoarthritis of both knees 10/11/2014  . Bilateral knee pain 07/31/2014  . Asthma, chronic 07/19/2014  . Gout of big toe 07/19/2014  . Midline low back pain with right-sided sciatica 01/01/2014  . Bilateral low back pain without sciatica 08/14/2013  . Essential hypertension 08/14/2013  . Gastroesophageal reflux disease without esophagitis 08/14/2013  . COPD exacerbation 08/14/2013  . Colon cancer screening 08/14/2013  . Low back pain 05/15/2013  . Preventative health care 05/15/2013  . GERD (gastroesophageal reflux disease) 02/16/2013  . DM (diabetes mellitus) 02/16/2013  . COPD (chronic obstructive pulmonary disease) 04/17/2011  . Hypertension 04/17/2011  . Cough 04/16/2011    HARRIS,KAREN 10/30/2014, 1:59 PM  [facility] [address] [place], [place], [address] Phone: [phone]   Fax:  [phone]     Melvenia Needles, PTA 10/30/2014 1:59 PM Phone: [phone] Fax: [phone]

## 2014-11-01 ENCOUNTER — Emergency Department (HOSPITAL_COMMUNITY)
Admission: EM | Admit: 2014-11-01 | Discharge: 2014-11-01 | Disposition: A | Discharge status: Home / Self Care | Payer: Medicaid Other | Attending: Emergency Medicine | Admitting: Emergency Medicine

## 2014-11-01 ENCOUNTER — Ambulatory Visit: Payer: Medicaid Other | Admitting: Physical Therapy

## 2014-11-01 DIAGNOSIS — M25562 Pain in left knee: Secondary | ICD-10-CM

## 2014-11-01 DIAGNOSIS — M62838 Other muscle spasm: Secondary | ICD-10-CM | POA: Diagnosis not present

## 2014-11-01 DIAGNOSIS — K219 Gastro-esophageal reflux disease without esophagitis: Secondary | ICD-10-CM | POA: Insufficient documentation

## 2014-11-01 DIAGNOSIS — J449 Chronic obstructive pulmonary disease, unspecified: Secondary | ICD-10-CM | POA: Insufficient documentation

## 2014-11-01 DIAGNOSIS — Z720 Tobacco use: Secondary | ICD-10-CM | POA: Diagnosis not present

## 2014-11-01 DIAGNOSIS — M25662 Stiffness of left knee, not elsewhere classified: Secondary | ICD-10-CM

## 2014-11-01 DIAGNOSIS — I10 Essential (primary) hypertension: Secondary | ICD-10-CM | POA: Diagnosis not present

## 2014-11-01 DIAGNOSIS — E785 Hyperlipidemia, unspecified: Secondary | ICD-10-CM | POA: Insufficient documentation

## 2014-11-01 DIAGNOSIS — M1990 Unspecified osteoarthritis, unspecified site: Secondary | ICD-10-CM | POA: Diagnosis not present

## 2014-11-01 DIAGNOSIS — R2681 Unsteadiness on feet: Secondary | ICD-10-CM

## 2014-11-01 DIAGNOSIS — R531 Weakness: Secondary | ICD-10-CM

## 2014-11-01 DIAGNOSIS — Z79899 Other long term (current) drug therapy: Secondary | ICD-10-CM | POA: Diagnosis not present

## 2014-11-01 DIAGNOSIS — R269 Unspecified abnormalities of gait and mobility: Secondary | ICD-10-CM

## 2014-11-01 DIAGNOSIS — E119 Type 2 diabetes mellitus without complications: Secondary | ICD-10-CM | POA: Diagnosis not present

## 2014-11-01 DIAGNOSIS — M791 Myalgia: Secondary | ICD-10-CM | POA: Diagnosis present

## 2014-11-01 DIAGNOSIS — M25661 Stiffness of right knee, not elsewhere classified: Secondary | ICD-10-CM

## 2014-11-01 DIAGNOSIS — R609 Edema, unspecified: Secondary | ICD-10-CM

## 2014-11-01 DIAGNOSIS — M25561 Pain in right knee: Secondary | ICD-10-CM

## 2014-11-01 LAB — I-STAT CHEM 8, ED
BUN: 10 mg/dL (ref 6–20)
Calcium, Ion: 1.11 mmol/L — ABNORMAL LOW (ref 1.12–1.23)
Chloride: 103 mmol/L (ref 101–111)
Creatinine, Ser: 0.9 mg/dL (ref 0.44–1.00)
Glucose, Bld: 96 mg/dL (ref 65–99)
HCT: 41 % (ref 36.0–46.0)
Hemoglobin: 13.9 g/dL (ref 12.0–15.0)
Potassium: 3.7 mmol/L (ref 3.5–5.1)
Sodium: 139 mmol/L (ref 135–145)
TCO2: 24 mmol/L (ref 0–100)

## 2014-11-01 MED ORDER — DIAZEPAM 5 MG/ML IJ SOLN
5.0000 mg | Freq: Once | INTRAMUSCULAR | Status: AC
  Administered 2014-11-01: 5 mg via INTRAMUSCULAR
  Filled 2014-11-01: qty 2

## 2014-11-01 MED ORDER — DIAZEPAM 5 MG PO TABS: 5.0000 mg | ORAL_TABLET | Freq: Two times a day (BID) | ORAL | Status: DC | PRN

## 2014-11-01 NOTE — ED Notes (Signed)
Per EMS patient C/o muscle spasms in her back. Patient had an epidural done and her back has hurt since

## 2014-11-01 NOTE — ED Notes (Signed)
Patient C/o low back pain with pain shooting down both legs.  States that she got a steroid injection Wednesday a week ago. Pain started again this AM.

## 2014-11-01 NOTE — Therapy (Signed)
[place], [place], [address] Phone: [phone]   Fax:  [phone]  Physical Therapy Treatment  Patient Details  Name: Stacy [name] MRN: [id number] Date of Birth: 10-Sep-1960 Referring Provider:  Tresa Garter, MD  Encounter Date: 11/01/2014      PT End of Session - 11/01/14 0934    Visit Number 4   Number of Visits 24   Date for PT Re-Evaluation 01/08/15   PT Start Time 0933   PT Stop Time 1010   PT Time Calculation (min) 37 min   Activity Tolerance Patient limited by pain;Other (comment)  Pt taken to ER due to pain/spasm/headache      Past Medical History  Diagnosis Date  . Hypertension     Pt on lisinopril  . GERD (gastroesophageal reflux disease)     Pt on Protonix daily  . Arthritis     Takes Naprosyn   . Diabetes mellitus     Takes Metformin 750 mg  . Hyperlipidemia   . Asthma   . COPD (chronic obstructive pulmonary disease)   . Shortness of breath     occasional - uses breathing tx at home  . Headache(784.0)     otc meds prn  . Irritable bowel syndrome 11/19/2010    Past Surgical History  Procedure Laterality Date  . Hernia repair    . Cholecystectomy    . Svd       x 2  . Tubal ligation    . Endometrial ablation  10/2010  . Upper gastrointestinal endoscopy  04/28/11    There were no vitals filed for this visit.  Visit Diagnosis:  Unsteadiness  Knee pain, bilateral  Stiffness of both knees  Abnormality of gait  Generalized weakness  Edema      Subjective Assessment - 11/01/14 0945    Subjective Pt has had constant headaches since epidural and tried to go to the ER this past weekend.  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 Yes   Pain Score 9    Pain Location Back   Pain Orientation Lower;Left;Right   Pain Descriptors / Indicators Shooting;Sharp   Pain Type Acute pain;Chronic pain   Pain Onset More than a month ago   Multiple Pain Sites Yes   Pain Score --  no number stated   Pain Location Knee   Pain Orientation Right   Pain Descriptors / Indicators Tender   Pain Type Acute pain   Pain Score 10   Pain Location Calf  spasming.   Pain Orientation Right;Left   Pain Descriptors / Indicators Sharp;Spasm   Pain Onset Today   Aggravating Factors  Pt fell to Right knee while turning to right with rolator in clinic to start PT treamtment                         [facility] Adult PT Treatment/Exercise - 11/01/14 0940    Cryotherapy   Number Minutes Cryotherapy 20 Minutes   Cryotherapy Location Knee;Lumbar Spine   Type of Cryotherapy Ice pack                  PT Short Term Goals -  10/30/14 1356    PT SHORT TERM GOAL #1   Title I with initial HEP   Baseline knows how to do , non compliant this past week   Time 3   Period Weeks   Status On-going   PT SHORT TERM GOAL #2   Title no falls for previous one week   Baseline fell 2 X Saturday   Time 4   Period Weeks   Status On-going   PT SHORT TERM GOAL #3   Title able to demonstrate safe stair climbing with one rail and CGA of spouse   Baseline says she is safe with at home with her spouse   Time 4   Period Weeks   Status Partially Met           PT Long Term Goals - 10/30/14 1357    PT LONG TERM GOAL #1   Title I with advanced HEP   Time 12   Period Weeks   Status On-going   PT LONG TERM GOAL #2   Title no falls in previous 4 weeks   Baseline still falling   Time 8   Status On-going   PT LONG TERM GOAL #3   Title decreased bil knee pain by 50% with ADLS   Baseline knee pain improved 30%   Time 12   Period Weeks   Status On-going   PT LONG TERM GOAL #4   Title improved BLE  strength to 4+/5 or better to improve function   Time 12   Period Weeks   Status On-going               Plan - 11/01/14 1147    Clinical Impression Statement Pt enters clinic with rolator and PT holding on belt worn by pt.  Pt begain to turn to right and fell to floor hitting right knee with left knee immobilized.  Pt taken to mat and tranferred while blocking Right knee and was max assist.. Pt was given ice packs to bil knees and to lumbar and laid in position of comfort.  Pt then was helped to lie on back and then she began crying inconsolably due to bil knee pain and back pain.  Pt BP 144/88,  110 HR and 96 SpO2%,  Pt husband called from clinic waiting room and  is was determined to call Ambulance to transport to ED.  Pt had been awake since 4 AM with headache. and had gone to the ER this past weekend.  Pt had recieved an epidural injection last WEdnesday Septmember  21, and was scheduled to see Dr. [name] tomorrow morning.  Pt  had fallen at evaluation and fell again.  Pt has had several falls at home as reported by husband.  Pt  may need further therapy in home health setting of outpatient neuro which may be better suited to her needs.  Pt is exhausted after walking into clinic with rolator and may need use of W/C for safety. and decrease risk of fall.s   PT Next Visit Plan TBD after pt is evaluated by MD at ED/sees Dr. [name] for further evaluation   Consulted and Agree with Plan of Care Patient;Family member/caregiver        Problem List Patient Active Problem List   Diagnosis Date Noted  . Primary osteoarthritis of both knees 10/11/2014  . Bilateral knee pain 07/31/2014  . Asthma, chronic 07/19/2014  . Gout of big toe 07/19/2014  . Midline low back pain with right-sided sciatica  01/01/2014  . Bilateral low back pain without sciatica 08/14/2013  . Essential hypertension 08/14/2013  . Gastroesophageal reflux disease without esophagitis 08/14/2013  . COPD exacerbation 08/14/2013   . Colon cancer screening 08/14/2013  . Low back pain 05/15/2013  . Preventative health care 05/15/2013  . GERD (gastroesophageal reflux disease) 02/16/2013  . DM (diabetes mellitus) 02/16/2013  . COPD (chronic obstructive pulmonary disease) 04/17/2011  . Hypertension 04/17/2011  . Cough 04/16/2011   No charge to patient.  Sent by [facility] to ED Voncille Lo, PT 11/01/2014 11:56 AM Phone: [phone] Fax: [place], [place], [address] Phone: [phone]   Fax:  [phone]

## 2014-11-01 NOTE — ED Notes (Signed)
Pt is in stable condition upon d/c and is escorted from ED via wheelchair. 

## 2014-11-01 NOTE — ED Provider Notes (Signed)
CSN: [id number]     Arrival date & time 11/01/14  1025 History   First MD Initiated Contact with Patient 11/01/14 1027     Chief Complaint  Patient presents with  . Muscle Pain     (Consider location/radiation/quality/duration/timing/severity/associated sxs/prior Treatment) HPI Comments: 54 year old female with a past medical history of hypertension, GERD, diabetes, COPD, chronic back pain, headaches and IBS presenting via EMS from physical therapy for her back complaining of muscle spasms in both of her lower legs. States she had a Steri-Drape injection 8 days ago in her back for chronic back pain and has been getting physical therapy for her back. Occasionally she gets these "really bad muscle spasms"in both of her lower legs that she normally takes Valium for, however only had 6 tablets prescribed the last time he no longer has his medication. While she was in therapy today, she gradually developed spasms in her lower legs that is starting to slowly migrate into her upper legs. Her legs are very tender to touch. Denies numbness or tingling of lower legs. Denies loss control of bowels or bladder or saddle anesthesia. Denies fever, chills or night sweats.  Patient is a 54 y.o. female presenting with musculoskeletal pain. The history is provided by the patient, the EMS personnel and the spouse.  Muscle Pain This is a recurrent problem. The current episode started today. The problem occurs intermittently. The problem has been gradually worsening. Associated symptoms include myalgias. Nothing aggravates the symptoms. She has tried nothing for the symptoms.    Past Medical History  Diagnosis Date  . Hypertension     Pt on lisinopril  . GERD (gastroesophageal reflux disease)     Pt on Protonix daily  . Arthritis     Takes Naprosyn   . Diabetes mellitus     Takes Metformin 750 mg  . Hyperlipidemia   . Asthma   . COPD (chronic obstructive pulmonary disease)   . Shortness of breath    occasional - uses breathing tx at home  . Headache(784.0)     otc meds prn  . Irritable bowel syndrome 11/19/2010   Past Surgical History  Procedure Laterality Date  . Hernia repair    . Cholecystectomy    . Svd       x 2  . Tubal ligation    . Endometrial ablation  10/2010  . Upper gastrointestinal endoscopy  04/28/11   Family History  Problem Relation Age of Onset  . Asthma Son     had as a child  . Heart disease Sister   . Breast cancer Sister   . Hypertension Father   . Cancer Father   . Hypertension Brother    Social History  Substance Use Topics  . Smoking status: Current Every Day Smoker -- 0.25 packs/day for 10 years    Types: Cigarettes  . Smokeless tobacco: Never Used  . Alcohol Use: No   OB History    No data available     Review of Systems  Musculoskeletal: Positive for myalgias.  All other systems reviewed and are negative.     Allergies  Other; Aspirin; and Ace inhibitors  Home Medications   Prior to Admission medications   Medication Sig Start Date End Date Taking? Authorizing Provider  acetaminophen-codeine (TYLENOL #3) 300-30 MG per tablet Take 1 tablet by mouth every 4 (four) hours as needed. Patient not taking: Reported on 10/25/2014 10/11/14   Quentin Angst, MD  albuterol (PROVENTIL) (2.5 MG/3ML) 0.083% nebulizer solution  Take 3 mLs (2.5 mg total) by nebulization every 4 (four) hours as needed for wheezing. 07/19/14   Quentin Angst, MD  allopurinol (ZYLOPRIM) 100 MG tablet Take 1 tablet (100 mg total) by mouth daily. 10/11/14   Quentin Angst, MD  Blood Glucose Monitoring Suppl (TRUE METRIX METER) DEVI 1 Device by Does not apply route 4 (four) times daily -  before meals and at bedtime. 07/19/14   Quentin Angst, MD  budesonide-formoterol (SYMBICORT) 80-4.5 MCG/ACT inhaler Inhale 2 puffs into the lungs 2 (two) times daily. 07/19/14 07/19/15  Quentin Angst, MD  colchicine 0.6 MG tablet Take 1 tablet (0.6 mg total) by mouth  daily. 10/11/14   Quentin Angst, MD  cyclobenzaprine (FLEXERIL) 5 MG tablet Take 1 tablet (5 mg total) by mouth 3 (three) times daily as needed for muscle spasms. Patient not taking: Reported on 10/25/2014 10/11/14   Quentin Angst, MD  diazepam (VALIUM) 5 MG tablet Take 1 tablet (5 mg total) by mouth every 12 (twelve) hours as needed for muscle spasms. 11/01/14   Eustolia Drennen M Yenni Carra, PA-C  EPINEPHrine 0.3 mg/0.3 mL IJ SOAJ injection Inject 0.3 mLs (0.3 mg total) into the muscle once. Patient not taking: Reported on 10/25/2014 09/10/14   Quentin Angst, MD  gabapentin (NEURONTIN) 300 MG capsule Take 1 capsule (300 mg total) by mouth 3 (three) times daily. 10/11/14   Quentin Angst, MD  glucose blood (TRUE METRIX BLOOD GLUCOSE TEST) test strip USE AS DIRECTED BY PHYSICIAN 07/31/14   Quentin Angst, MD  HYDROcodone-acetaminophen (NORCO/VICODIN) 5-325 MG per tablet Take 2 tablets by mouth every 4 (four) hours as needed. Patient not taking: Reported on 10/16/2014 08/15/14   Emilia Beck, PA-C  hydrOXYzine (ATARAX/VISTARIL) 10 MG tablet Take 1 tablet (10 mg total) by mouth 3 (three) times daily as needed. Patient taking differently: Take 10 mg by mouth 3 (three) times daily as needed for itching.  07/19/14   Quentin Angst, MD  metFORMIN (GLUCOPHAGE-XR) 750 MG 24 hr tablet Take 1 tablet (750 mg total) by mouth daily with breakfast. 10/11/14   Quentin Angst, MD  metoCLOPramide (REGLAN) 5 MG tablet Take 1 tablet (5 mg total) by mouth every 8 (eight) hours as needed for nausea or vomiting. 05/15/13   Quentin Angst, MD  nicotine (NICODERM CQ - DOSED IN MG/24 HOURS) 21 mg/24hr patch Place 1 patch (21 mg total) onto the skin daily. Patient not taking: Reported on 10/16/2014 10/11/14   Quentin Angst, MD  pantoprazole (PROTONIX) 40 MG tablet Take 1 tablet (40 mg total) by mouth daily. 10/11/14   Quentin Angst, MD  simvastatin (ZOCOR) 20 MG tablet Take 1 tablet (20 mg total) by mouth  at bedtime. 10/11/14   Quentin Angst, MD  TRUEPLUS LANCETS 26G MISC 1 each by Does not apply route 4 (four) times daily - after meals and at bedtime. 07/19/14   Quentin Angst, MD  valsartan-hydrochlorothiazide (DIOVAN-HCT) 160-12.5 MG per tablet Take 1 tablet by mouth daily. 10/11/14   Quentin Angst, MD  Vitamin D, Ergocalciferol, (DRISDOL) 50000 UNITS CAPS capsule Take 1 capsule (50,000 Units total) by mouth every 7 (seven) weeks. Sundays 10/11/14   Quentin Angst, MD   BP 131/85 mmHg  Pulse 64  Temp(Src) 98.4 F (36.9 C) (Oral)  Resp 18  SpO2 90% Physical Exam  Constitutional: She is oriented to person, place, and time. She appears well-developed and well-nourished. No distress.  Tearful,  no acute distress.  HENT:  Head: Normocephalic and atraumatic.  Mouth/Throat: Oropharynx is clear and moist.  Eyes: Conjunctivae and EOM are normal.  Neck: Normal range of motion. Neck supple.  Cardiovascular: Normal rate, regular rhythm and normal heart sounds.   +2 PT/DP pulse bilateral.  Pulmonary/Chest: Effort normal and breath sounds normal. No respiratory distress.  Musculoskeletal: Normal range of motion. She exhibits no edema.  Generalized tenderness to lower legs from ankle to knee. No specific point tenderness. No swelling, erythema, warmth. Skin intact.  Neurological: She is alert and oriented to person, place, and time. No sensory deficit.  Sensation intact to light touch of bilateral lower extremities. Strength 5/5 and equal bilateral lower extremities.  Skin: Skin is warm and dry.  Psychiatric: She has a normal mood and affect. Her behavior is normal.  Nursing note and vitals reviewed.   ED Course  Procedures (including critical care time) Labs Review Labs Reviewed  I-STAT CHEM 8, ED - Abnormal; Notable for the following:    Calcium, Ion 1.11 (*)    All other components within normal limits    Imaging Review No results found. I have personally reviewed and  evaluated these images and lab results as part of my medical decision-making.   EKG Interpretation None      MDM   Final diagnoses:  Muscle spasm of both lower legs   Nontoxic appearing, NAD. AF VSS. History of chronic back pain and occasional muscle spasms. Chem 8 WNL. Symptoms resolved with Valium. Will give a prescription for 5 tablets of Valium and patient states she will follow-up with her orthopedist and PCP. Stable for d/c. Return precautions given. Patient states understanding of treatment care plan and is agreeable.  Kathrynn Speed, PA-C 11/01/14 1237  Tilden Fossa, MD 11/01/14 [phone]

## 2014-11-01 NOTE — Discharge Instructions (Signed)
Take Valium as needed as directed for muscle spasm. No driving or operating heavy machinery while taking valium. This medication may cause drowsiness. Follow-up with your orthopedist within the next 1-2 days.  Spasticity Spasticity is a condition in which certain muscles contract continuously. This causes stiffness or tightness of the muscles. It may interfere with movement, speech, and manner of walking. CAUSES  This condition is usually caused by damage to the portion of the brain or spinal cord that controls voluntary movement. It may occur in association with:  Spinal cord injury.  Multiple sclerosis.  Cerebral palsy.  Brain damage due to lack of oxygen.  Brain trauma.  Severe head injury.  Metabolic diseases such as:  Adrenoleukodystrophy.  ALS Truddie Hidden Gehrig's disease).  Phenylketonuria. SYMPTOMS   Increased muscle tone (hypertonicity).  A series of rapid muscle contractions (clonus).  Exaggerated deep tendon reflexes.  Muscle spasms.  Involuntary crossing of the legs (scissoring).  Fixed joints. The degree of spasticity varies. It ranges from mild muscle stiffness to severe, painful, and uncontrollable muscle spasms. It can interfere with rehabilitation in patients with certain disorders. It often interferes with daily activities. TREATMENT  Treatment may include:  Medications.  Physical therapy regimens. They may include muscle stretching and range of motion exercises. These help prevent shrinkage or shortening of muscles. They also help reduce the severity of symptoms.  Surgery. This may be recommended for tendon release or to sever the nerve-muscle pathway. PROGNOSIS  The outcome for those with spasticity depends on:  Severity of the spasticity.  Associated disorder(s). Document Released: 01/09/2002 Document Revised: 04/13/2011 Document Reviewed: 04/04/2013 [facility] Patient Information 2015 [place], [place]. This information is not intended to replace  advice given to you by your health care provider. Make sure you discuss any questions you have with your health care provider.  Muscle Cramps and Spasms Muscle cramps and spasms occur when a muscle or muscles tighten and you have no control over this tightening (involuntary muscle contraction). They are a common problem and can develop in any muscle. The most common place is in the calf muscles of the leg. Both muscle cramps and muscle spasms are involuntary muscle contractions, but they also have differences:   Muscle cramps are sporadic and painful. They may last a few seconds to a quarter of an hour. Muscle cramps are often more forceful and last longer than muscle spasms.  Muscle spasms may or may not be painful. They may also last just a few seconds or much longer. CAUSES  It is uncommon for cramps or spasms to be due to a serious underlying problem. In many cases, the cause of cramps or spasms is unknown. Some common causes are:   Overexertion.   Overuse from repetitive motions (doing the same thing over and over).   Remaining in a certain position for a long period of time.   Improper preparation, form, or technique while performing a sport or activity.   Dehydration.   Injury.   Side effects of some medicines.   Abnormally low levels of the salts and ions in your blood (electrolytes), especially potassium and calcium. This could happen if you are taking water pills (diuretics) or you are pregnant.  Some underlying medical problems can make it more likely to develop cramps or spasms. These include, but are not limited to:   Diabetes.   Parkinson disease.   Hormone disorders, such as thyroid problems.   Alcohol abuse.   Diseases specific to muscles, joints, and bones.   Blood  vessel disease where not enough blood is getting to the muscles.  HOME CARE INSTRUCTIONS   Stay well hydrated. Drink enough water and fluids to keep your urine clear or pale  yellow.  It may be helpful to massage, stretch, and relax the affected muscle.  For tight or tense muscles, use a warm towel, heating pad, or hot shower water directed to the affected area.  If you are sore or have pain after a cramp or spasm, applying ice to the affected area may relieve discomfort.  Put ice in a plastic bag.  Place a towel between your skin and the bag.  Leave the ice on for 15-20 minutes, 03-04 times a day.  Medicines used to treat a known cause of cramps or spasms may help reduce their frequency or severity. Only take over-the-counter or prescription medicines as directed by your caregiver. SEEK MEDICAL CARE IF:  Your cramps or spasms get more severe, more frequent, or do not improve over time.  MAKE SURE YOU:   Understand these instructions.  Will watch your condition.  Will get help right away if you are not doing well or get worse. Document Released: 07/11/2001 Document Revised: 05/16/2012 Document Reviewed: 01/06/2012 [facility] Patient Information 2015 [place], [place]. This information is not intended to replace advice given to you by your health care provider. Make sure you discuss any questions you have with your health care provider.

## 2014-11-05 ENCOUNTER — Telehealth: Payer: Self-pay | Admitting: Physical Therapy

## 2014-11-05 NOTE — Telephone Encounter (Signed)
Pt was called to cancel appt for 8:00 am Oct 4 for physical therapy.  Dr. [name] was contacted and returned phone call to Garen Lah PT to discuss recent events and falls for pt that occureed on evaluation and 3rd visit.  Dr. [name] asked that Ms. Jarrett be placed on hold due to report of 8 falls since Friday, September 30th.  Dr. Barbaraann Faster office will be contacting Ms. Morais about referral to neurologist and tests necessary to determine cause of falls.  Again Ms. Xiong was asked to use W/C only and to avoid use of 4 wheel rollator until tests are conducted to prevent any additional falls.  Garen Lah, PT 11/05/2014 6:23 PM Phone: [phone] Fax: [phone]

## 2014-11-05 NOTE — Telephone Encounter (Signed)
Pt contacted to check on status post ER visit.  Pt states she has fallen 8 times since going to Dr. Barbaraann Faster office.  On Friday. Sept 30.  Pt. Reports she has headaches have resolved but she has fallen 8 times since.  Pt uses a 4 wheel walker rollator.  Pt was educated on fall risk and that she should be using wheelchair for safety.  She is not safe to use rollator to attend PT appointment on Tuesday. October 4.  She must use a walker to preserve strength and prevent falls.  Dr. Barbaraann Faster office was contacted.  Heather RN was contacted about information and falls that Ms. Salvador had since office visit and in previous weeks.  Heather and PT also discussed need for [facility] to aid Ms. Channell financially.     PT is concerned that Ms. Gugliotta will continue falling since she must climb stairs with help of husband in her home.  She is so week and unable to climb/or even walk safely with rollator.due to bil pain in knees and back.  Garen Lah, PT 11/05/2014 4:52 PM Phone: [phone] Fax: [phone]

## 2014-11-06 ENCOUNTER — Ambulatory Visit: Payer: Medicaid Other | Attending: Internal Medicine | Admitting: Physical Therapy

## 2014-11-06 ENCOUNTER — Encounter (HOSPITAL_COMMUNITY): Payer: Self-pay | Admitting: Emergency Medicine

## 2014-11-06 ENCOUNTER — Emergency Department (HOSPITAL_COMMUNITY)
Admission: EM | Admit: 2014-11-06 | Discharge: 2014-11-07 | Disposition: A | Discharge status: Home / Self Care | Payer: Medicaid Other | Attending: Emergency Medicine | Admitting: Emergency Medicine

## 2014-11-06 DIAGNOSIS — Z79899 Other long term (current) drug therapy: Secondary | ICD-10-CM | POA: Diagnosis not present

## 2014-11-06 DIAGNOSIS — M25561 Pain in right knee: Secondary | ICD-10-CM | POA: Insufficient documentation

## 2014-11-06 DIAGNOSIS — I10 Essential (primary) hypertension: Secondary | ICD-10-CM | POA: Diagnosis not present

## 2014-11-06 DIAGNOSIS — R7302 Impaired glucose tolerance (oral): Secondary | ICD-10-CM

## 2014-11-06 DIAGNOSIS — Z720 Tobacco use: Secondary | ICD-10-CM | POA: Insufficient documentation

## 2014-11-06 DIAGNOSIS — Z7951 Long term (current) use of inhaled steroids: Secondary | ICD-10-CM | POA: Diagnosis not present

## 2014-11-06 DIAGNOSIS — E7439 Other disorders of intestinal carbohydrate absorption: Secondary | ICD-10-CM | POA: Insufficient documentation

## 2014-11-06 DIAGNOSIS — M1990 Unspecified osteoarthritis, unspecified site: Secondary | ICD-10-CM | POA: Diagnosis not present

## 2014-11-06 DIAGNOSIS — K219 Gastro-esophageal reflux disease without esophagitis: Secondary | ICD-10-CM | POA: Diagnosis not present

## 2014-11-06 DIAGNOSIS — R269 Unspecified abnormalities of gait and mobility: Secondary | ICD-10-CM | POA: Diagnosis present

## 2014-11-06 DIAGNOSIS — M25662 Stiffness of left knee, not elsewhere classified: Secondary | ICD-10-CM | POA: Diagnosis present

## 2014-11-06 DIAGNOSIS — R600 Localized edema: Secondary | ICD-10-CM | POA: Diagnosis present

## 2014-11-06 DIAGNOSIS — M25562 Pain in left knee: Secondary | ICD-10-CM | POA: Diagnosis present

## 2014-11-06 DIAGNOSIS — R2681 Unsteadiness on feet: Secondary | ICD-10-CM

## 2014-11-06 DIAGNOSIS — E785 Hyperlipidemia, unspecified: Secondary | ICD-10-CM | POA: Insufficient documentation

## 2014-11-06 DIAGNOSIS — J449 Chronic obstructive pulmonary disease, unspecified: Secondary | ICD-10-CM | POA: Insufficient documentation

## 2014-11-06 DIAGNOSIS — M25661 Stiffness of right knee, not elsewhere classified: Secondary | ICD-10-CM | POA: Diagnosis present

## 2014-11-06 DIAGNOSIS — R531 Weakness: Secondary | ICD-10-CM | POA: Insufficient documentation

## 2014-11-06 DIAGNOSIS — E1165 Type 2 diabetes mellitus with hyperglycemia: Secondary | ICD-10-CM | POA: Diagnosis present

## 2014-11-06 LAB — URINALYSIS, ROUTINE W REFLEX MICROSCOPIC
Bilirubin Urine: NEGATIVE
Glucose, UA: NEGATIVE mg/dL
Hgb urine dipstick: NEGATIVE
Ketones, ur: NEGATIVE mg/dL
Nitrite: NEGATIVE
Protein, ur: NEGATIVE mg/dL
Specific Gravity, Urine: 1.005 (ref 1.005–1.030)
Urobilinogen, UA: 0.2 mg/dL (ref 0.0–1.0)
pH: 6 (ref 5.0–8.0)

## 2014-11-06 LAB — CBC
HCT: 36.2 % (ref 36.0–46.0)
Hemoglobin: 12.1 g/dL (ref 12.0–15.0)
MCH: 28 pg (ref 26.0–34.0)
MCHC: 33.4 g/dL (ref 30.0–36.0)
MCV: 83.8 fL (ref 78.0–100.0)
Platelets: 234 10*3/uL (ref 150–400)
RBC: 4.32 MIL/uL (ref 3.87–5.11)
RDW: 14.6 % (ref 11.5–15.5)
WBC: 10.7 10*3/uL — ABNORMAL HIGH (ref 4.0–10.5)

## 2014-11-06 LAB — URINE MICROSCOPIC-ADD ON

## 2014-11-06 LAB — CBG MONITORING, ED: Glucose-Capillary: 104 mg/dL — ABNORMAL HIGH (ref 65–99)

## 2014-11-06 NOTE — Patient Instructions (Signed)
   Copyright  VHI. All rights reserved.  HIP: Flexion / KNEE: Extension, Straight Leg Raise   Raise leg, keeping knee straight. Perform slowly. _10__ reps per set, __3_ sets per day, _7_ days per week   Copyright  VHI. All rights reserved.  Heel Slide   Bend knee and pull heel toward buttocks. Hold _3-5___ seconds. Return. Repeat with other knee. Repeat __10__ times. Do _2-3___ sessions per day.  http://gt2.exer.us/372   Copyright  VHI. All rights reserved.     Raise leg until knee is straight. _10__ reps per set, __3_ sets per day, _7_ days per week  Copyright  VHI. All rights reserved.  Short Arc [employer] a large can or rolled towel under leg. Straighten knee and leg. Hold _3-5___ seconds. Repeat with other leg. Repeat __10__ times. Do __2-3http://gt2.exer.us/365   Copyright  VHI. All rights reserved.  Quad Set   Slowly tighten muscles on thigh of straight leg while counting out loud to _5___. Repeat with other leg. Put towel under knee and press knee into towel Repeat 10 x3____ times. Do __2-3__ sessions per day.  You may watch TV commercials   [facility] small of back into mat, maintain pelvic tilt, roll up one vertebrae at a time. Focus on engaging posterior hip muscles. Hold for 3-5 seconds. Repeat __10__ times. Can progress to 3 x 10 as you grown stronger.  Abduction: Clam (Eccentric) - Side-Lying   Lie on side with knees bent. Lift top knee, keeping feet together. Keep trunk steady. Slowly lower for 3-5 seconds. __10_ reps per set, _2-3__ sets per day, 7_ days per week. Use Red Theraband for increasing strength.  Copyright  VHI. All rights reserved.      Copyright  VHI. All rights reserved.  Garen Lah, PT 11/06/2014 8:13 AM Phone: [phone] Fax: [phone]

## 2014-11-06 NOTE — Therapy (Addendum)
[place], [place], [address] Phone: [phone]   Fax:  [phone]  Physical Therapy Treatment  Patient Details  Name: Stacy [name] MRN: [id number] Date of Birth: Jun 29, 1960 Referring Provider:  Tresa Garter, MD  Encounter Date: 11/06/2014      PT End of Session - 11/06/14 0840    Visit Number 5   Number of Visits 24   Date for PT Re-Evaluation 01/08/15   PT Start Time 7616   PT Stop Time 0847   PT Time Calculation (min) 52 min   Activity Tolerance Patient tolerated treatment well   Behavior During Therapy [facility] for tasks assessed/performed      Past Medical History  Diagnosis Date  . Hypertension     Pt on lisinopril  . GERD (gastroesophageal reflux disease)     Pt on Protonix daily  . Arthritis     Takes Naprosyn   . Diabetes mellitus     Takes Metformin 750 mg  . Hyperlipidemia   . Asthma   . COPD (chronic obstructive pulmonary disease)   . Shortness of breath     occasional - uses breathing tx at home  . Headache(784.0)     otc meds prn  . Irritable bowel syndrome 11/19/2010    Past Surgical History  Procedure Laterality Date  . Hernia repair    . Cholecystectomy    . Svd       x 2  . Tubal ligation    . Endometrial ablation  10/2010  . Upper gastrointestinal endoscopy  04/28/11    There were no vitals filed for this visit.  Visit Diagnosis:  Unsteadiness  Knee pain, bilateral  Stiffness of both knees  Abnormality of gait  Generalized weakness  Localized edema      Subjective Assessment - 11/06/14 0750    Subjective Pt did not get phone message to be on hold for PT by Dr. [name]. Pt recieved blood patch one week post epidural  on Friday by Dr. Ernestina Patches. and headache has resolved.  Pt and husband reported falling 8 times yesterday morning. and 3 additional times since morning phone call.  Total 11 times.  She fell trying to go to bathroom and getting into car..   Pt /husband verbalized they understood that PT would be on hold until further tests ordered by MD and evaluated by neurologist.   Pertinent History [place], [place]   Patient Stated Goals get better, no pain, be able to walk without falling.     Currently in Pain? Yes   Pain Score 8   tries not to lie flat on it.   Pain Location Back   Pain Orientation Lower;Mid   Pain Descriptors / Indicators Sharp;Shooting   Pain Type Acute pain;Chronic pain   Pain Onset More than a month ago   Pain Frequency Intermittent   Aggravating Factors  pain wakes her up at night   Pain Relieving Factors braces, knee immobilizers and heel lifts in shoes   Effect of Pain on Daily Activities Unable to walk safely and is not using a W/C full time   Pain Score 8  when standing on it.   Pain Location Knee   Pain Orientation Right   Pain Descriptors / Indicators Sharp   Pain Onset More than a month ago   Pain Frequency Intermittent   Pain Score 9   Pain Location Calf   Pain Orientation Right;Left   Pain Descriptors / Indicators Aching;Sore  Pain Onset In the past 7 days   Pain Frequency Intermittent            [facility] PT Assessment - 11/06/14 0824    Strength   Right Hip Extension 4-/5   Right Hip ABduction 3-/5  pt tries to compensate with hip flexion   Right Hip ADduction 4/5   Left Hip Flexion 4-/5   Left Hip Extension 4-/5   Left Hip ABduction 3-/5  pt tries to compensate with hip flexion   Right Knee Extension 4/5   Left Knee Extension 4/5   Transfers   Transfers Squat Pivot Transfers   Sit to Stand With upper extremity assist;3: Mod assist   Squat Pivot Transfers 3: Mod assist   Ambulation/Gait   Gait Comments Due to 11 falls yesterday pt is advised to only use W/C until further testing complete and MD advises.                     [facility] Adult PT Treatment/Exercise - 11/06/14 0824    Knee/Hip Exercises: Supine   Quad Sets 10 reps  bil   Short Arc Quad Sets 10 reps   bil over bolster   Short Arc Quad Sets Limitations painful   Heel Slides Both;10 reps;Strengthening   Heel Slides Limitations painful   Straight Leg Raises 10 reps;Both  bil with poor motor control   Other Supine Knee/Hip Exercises bridging  2 x 10, unable to perform for full range    Other Supine Knee/Hip Exercises LAQ 10 x Rgiht and Left each , Pt/husband showed how to progress with red t band   Manual Therapy   Manual Therapy Taping   Manual therapy comments taping to prevent lateral tracking both   McConnell lateral to medial pull bilaterally                PT Education - 11/06/14 0825    Education provided Yes   Education Details Falls and prevention, need to use W/C full time.  discussed need for brake on R on W/C to be tightened and husband can tighten at home for safe locking of W/C,  Pt performed/educated/demonstrated safe HEP to use at home/  Pt also given handouts for communtiy resources.   Person(s) Educated Patient;Spouse   Methods Explanation;Demonstration;Tactile cues;Verbal cues;Handout   Comprehension Verbalized understanding;Returned demonstration          PT Short Term Goals - 11/06/14 1325    PT SHORT TERM GOAL #1   Title I with initial HEP   Baseline knows how to do , non compliant this past week   Time 3   Period Weeks   Status On-going   PT SHORT TERM GOAL #2   Title no falls for previous one week   Baseline Pt has had 11 falls Oct. 3, 2016   Time 4   Period Weeks   Status On-going   PT SHORT TERM GOAL #3   Title able to demonstrate safe stair climbing with one rail and CGA of spouse   Baseline says she is safe with at home with her spouse   Time 4   Period Weeks   Status On-going           PT Long Term Goals - 10/30/14 1357    PT LONG TERM GOAL #1   Title I with advanced HEP   Time 12   Period Weeks   Status On-going   PT LONG TERM GOAL #2   Title  no falls in previous 4 weeks   Baseline still falling   Time 8   Status  On-going   PT LONG TERM GOAL #3   Title decreased bil knee pain by 50% with ADLS   Baseline knee pain improved 30%   Time 12   Period Weeks   Status On-going   PT LONG TERM GOAL #4   Title improved BLE strength to 4+/5 or better to improve function   Time 12   Period Weeks   Status On-going               Plan - 11/06/14 1316    Clinical Impression Statement Pt enters clinic in W/C as directed by PT over telephone call yesterday.  Pt did not recieve message that PT will be on hold until further testing as advised by Dr. [name]. Dr. Otho Ket office will be calling Ms Bayona for RX and futher  testing due to excessive falling.  Pt  /husband reports falling 11 times in home environment mostly on transitional movements.  Pt now using W/C full time as advised by PT.  Pt was given HEP for hip/knee strength in supine and sitting to conitnue strengthening while being on hold for PT.  Pt/husband  observed and pt demonstrated all exercises.  MMT for bil LE has declined since evaluation.  Pt /husband was told to always assist wth any transfer, bathroom, WC to bed , chair etc.  Pt recieved HEP written and proceeded to bathroom with husband and technician Curlene Dolphin.  Fall was prevented by husband and Curlene Dolphin.  Ms Lampe is at 100% fall rate will be on HOLD until MD evaluates.  Pt may be better served in a home health setting to work on fall prevention in the home/outpatient neuro etc.  Appropriate setting will be assessed at later date.   Pt will benefit from skilled therapeutic intervention in order to improve on the following deficits Difficulty walking;Hypermobility;Pain;Decreased mobility;Decreased strength;Increased edema   Rehab Potential Good   PT Frequency 2x / week   PT Duration 12 weeks   PT Treatment/Interventions ADLs/Self Care Home Management;Electrical Stimulation;Cryotherapy;Iontophoresis 92m/ml Dexamethasone;Moist Heat;Stair training;Gait training;Therapeutic exercise;Manual  techniques;Orthotic Fit/Training;Patient/family education;Neuromuscular re-education;Passive range of motion;Taping;Vasopneumatic Device   PT Next Visit Plan Pt placed on hold due to excessive falls and need for further evaluation as directed by Dr. NLouanne Skye  PT [place] clamshell, QS, SLR heel slide, SAQ, LAQ   Consulted and Agree with Plan of Care Patient;Family member/caregiver        Problem List Patient Active Problem List   Diagnosis Date Noted  . Primary osteoarthritis of both knees 10/11/2014  . Bilateral knee pain 07/31/2014  . Asthma, chronic 07/19/2014  . Gout of big toe 07/19/2014  . Midline low back pain with right-sided sciatica 01/01/2014  . Bilateral low back pain without sciatica 08/14/2013  . Essential hypertension 08/14/2013  . Gastroesophageal reflux disease without esophagitis 08/14/2013  . COPD exacerbation ([place] 08/14/2013  . Colon cancer screening 08/14/2013  . Low back pain 05/15/2013  . Preventative health care 05/15/2013  . GERD (gastroesophageal reflux disease) 02/16/2013  . DM (diabetes mellitus) ([place] 02/16/2013  . COPD (chronic obstructive pulmonary disease) ([place] 04/17/2011  . Hypertension 04/17/2011  . Cough 04/16/2011   LVoncille Lo PT 11/06/2014 3:05 PM Phone: [phone]: [address] [address]Phone: [phone]  Fax:  [phone]  PHYSICAL THERAPY DISCHARGE SUMMARY  Visits from Start of Care: 5  Current functional level related to goals / functional outcomes:  Pt has had multiple falls ( 2 in clinic)  Pt was sent to ER for uncontrollable bil leg spasms while in clinic 2nd to last visit to [facility].  Dr. [name] was called and was going to arrange a neurological consult after being alerted of multiple falls (11).    Remaining deficits:  Esparanza Krider called Voncille Lo PT and asked her to return  call. Ms. Tolleson reports that she has fallen over 25 x since last contacted and that she has not heard from Dr. Otho Ket office about a neuorology referral. Pt was reminded that she was to call Dr. [name] if she had not heard later that same day. Pt also reports that her relative (father-in- law?) died this past weekeend so she had been busy.  When Dr. Otho Ket office was called.  Referral to neurologist has been waiting for Ms. Curtice.  Pt was told to contact Dr. [name] to schedule appt with referral.            Education / Equipment: Initial HEP and falls prevention Plan:                                                    Patient goals were not met. Patient is being discharged due to a change in medical status.  ?????  And referral to additional medical work up.  Pt also never contacted or returned to clinic after phone call.  Pt was encouraged to call Dr. Otho Ket office where they had a referral waiting for pt to neurologist          Voncille Lo, PT 12/13/2014 9:54 AM Phone: [phone] Fax: [phone]

## 2014-11-06 NOTE — ED Notes (Signed)
Patient c/o high blood sugar. States she checked it at 2054, CBG 385. Rechecked at 2110 it was 395. CBG on arrival to ER is 104. Patient states she takes pills for diabetes in the am, did not take anything for DM PTA. Patient states she was seeing spots and felt like she was going to pass out.

## 2014-11-07 ENCOUNTER — Encounter (HOSPITAL_COMMUNITY): Payer: Self-pay | Admitting: Emergency Medicine

## 2014-11-07 LAB — BASIC METABOLIC PANEL
Anion gap: 4 — ABNORMAL LOW (ref 5–15)
BUN: 16 mg/dL (ref 6–20)
CO2: 29 mmol/L (ref 22–32)
Calcium: 8.8 mg/dL — ABNORMAL LOW (ref 8.9–10.3)
Chloride: 108 mmol/L (ref 101–111)
Creatinine, Ser: 0.8 mg/dL (ref 0.44–1.00)
GFR calc Af Amer: >60 mL/min (ref >60)
GFR calc non Af Amer: >60 mL/min (ref >60)
Glucose, Bld: 100 mg/dL — ABNORMAL HIGH (ref 65–99)
Potassium: 4.2 mmol/L (ref 3.5–5.1)
Sodium: 141 mmol/L (ref 135–145)

## 2014-11-07 LAB — CBG MONITORING, ED: Glucose-Capillary: 100 mg/dL — ABNORMAL HIGH (ref 65–99)

## 2014-11-07 NOTE — Discharge Instructions (Signed)
Screening for Type 2 Diabetes Screening is a way to check for type 2 diabetes in people who do not have symptoms of the disease, but who may likely develop diabetes in the future. Diabetes can lead to serious health problems, but finding diabetes early allows for early treatment. DIABETES RISK FACTORS   Family history of diabetes.  Diseases of the pancreas.  Obesity or being overweight.  Certain racial or ethnic groups:  American Indian.  Pacific Islander.  Hispanic.  Asian.  African American.  High blood pressure (hypertension).  History of diabetes while pregnant (gestational diabetes).  Delivering a baby that weighed over 9 pounds.  Being inactive.  High cholesterol or triglycerides.  Age, especially over 45 years of age.  Other diseases or conditions.  Diseases of the pancreas.  Cardiovascular disease.  Disorders of the endocrine system.  Certain medicines, such as those that treat high blood cholesterol levels. WHO IS SCREENED Adults  Adults who have no risk factors and no symptoms should be screened starting at age 45. If the screening tests are normal, they should be repeated every 3 years.  Adults who do not have symptoms, but have 1 or more risk factors, should be screened.  Adults who have 2 or more risk factors may be screened every year.  Adults who have an A1c (3 month average of blood glucose) greater than 5.7% or who had an impaired glucose tolerance (IGT) or impaired fasting glucose (IFG) on a previous test should be screened.  Pregnant women who have risk factors should be screened at their first prenatal visit.  Women who have given birth and had gestational diabetes should be screened 6-12 weeks after the child is born. This screening should be repeated every 1-3 years after the first test. Children or Adolescents  Children and adolescents should be screened for type 2 diabetes if they are overweight and have 2 of the following risk  factors:  Having a family history of type 2 diabetes.  Being a member of a high risk race or ethnic group.  Having signs of insulin resistance or conditions associated with insulin resistance.  Having a mother who had gestational diabetes while pregnant with him or her.  Screening should start at age 10 or at the onset of puberty, whichever comes first. This should be repeated every 2 years. SCREENING In a screening, your caregiver may:  Ask questions about your overall health. This will include questions about the health of close family members, too.  Ask about any diabetes-like symptoms you may have.  Perform a physical exam.  Order some tests that may include:  A fasting plasma glucose test. This measures the level of glucose in your blood. It is done after you have had nothing to eat but water (fasted) for 8 hours.  A random blood glucose test. This test is done without the need to fast.  An oral glucose tolerance test. This is a blood test done in 2 parts. First, a blood sample is taken after you have fasted. Then, another sample is taken after you drink a liquid that contains a lot of sugar.  An A1c test. This test shows how much glucose has been in your blood over the past 2 to 3 months.   This information is not intended to replace advice given to you by your health care provider. Make sure you discuss any questions you have with your health care provider.   Document Released: 11/15/2008 Document Revised: 02/09/2014 Document Reviewed: 08/27/2010 Elsevier Interactive   Patient Education 2016 [employer] Inc.  

## 2014-11-07 NOTE — ED Provider Notes (Signed)
CSN: [id number]   Arrival date & time 11/06/14 2147  History  By signing my name below, I, Stacy [name], attest that this documentation has been prepared under the direction and in the presence of Dhruv Christina, MD. Electronically Signed: Bethel [name], ED Scribe. 11/07/2014. 12:48 AM.  Chief Complaint  Patient presents with  . Hyperglycemia    HPI Patient is a 54 y.o. female presenting with hyperglycemia. The history is provided by the patient. No language interpreter was used.  Hyperglycemia Blood sugar level PTA:  385 Severity:  Moderate Onset quality:  Gradual Duration:  3 hours Timing:  Constant Progression:  Unchanged Diabetes status:  Controlled with oral medications Current diabetic therapy:  Metformin Context: not change in medication, not insulin pump use, not new diabetes diagnosis, not noncompliance and not recent change in diet   Relieved by:  Nothing Ineffective treatments:  None tried Associated symptoms: no altered mental status, no blurred vision, no dehydration, no diaphoresis, no dizziness, no dysuria, no fever, no nausea, no polyuria, no vomiting and no weakness   Risk factors: no obesity and no recent steroid use    Stacy [name] is a 54 y.o. female with PMHx of DM who presents to the Emergency Department complaining of hyperglycemia with onset today. Pt checked her blood sugar at home around 8:30 PM and it was 385. She took her metformin this morning. Pt denies current steroid use.   Past Medical History  Diagnosis Date  . Hypertension     Pt on lisinopril  . GERD (gastroesophageal reflux disease)     Pt on Protonix daily  . Arthritis     Takes Naprosyn   . Diabetes mellitus     Takes Metformin 750 mg  . Hyperlipidemia   . Asthma   . COPD (chronic obstructive pulmonary disease) (HCC)   . Shortness of breath     occasional - uses breathing tx at home  . Headache(784.0)     otc meds prn  . Irritable bowel syndrome 11/19/2010    Past Surgical  History  Procedure Laterality Date  . Hernia repair    . Cholecystectomy    . Svd       x 2  . Tubal ligation    . Endometrial ablation  10/2010  . Upper gastrointestinal endoscopy  04/28/11    Family History  Problem Relation Age of Onset  . Asthma Son     had as a child  . Heart disease Sister   . Breast cancer Sister   . Hypertension Father   . Cancer Father   . Hypertension Brother     Social History  Substance Use Topics  . Smoking status: Current Every Day Smoker -- 0.50 packs/day for 10 years    Types: Cigarettes  . Smokeless tobacco: Never Used  . Alcohol Use: No     Review of Systems  Constitutional: Negative for fever, chills and diaphoresis.  Eyes: Negative for blurred vision.  Gastrointestinal: Negative for nausea and vomiting.  Endocrine: Negative for polyuria.  Genitourinary: Negative for dysuria.  Neurological: Negative for dizziness and weakness.  All other systems reviewed and are negative.  Home Medications   Prior to Admission medications   Medication Sig Start Date End Date Taking? Authorizing Provider  acetaminophen-codeine (TYLENOL #3) 300-30 MG per tablet Take 1 tablet by mouth every 4 (four) hours as needed. Patient not taking: Reported on 10/25/2014 10/11/14   Quentin Angst, MD  albuterol (PROVENTIL) (2.5 MG/3ML) 0.083% nebulizer solution Take  3 mLs (2.5 mg total) by nebulization every 4 (four) hours as needed for wheezing. 07/19/14   Quentin Angst, MD  allopurinol (ZYLOPRIM) 100 MG tablet Take 1 tablet (100 mg total) by mouth daily. 10/11/14   Quentin Angst, MD  Blood Glucose Monitoring Suppl (TRUE METRIX METER) DEVI 1 Device by Does not apply route 4 (four) times daily -  before meals and at bedtime. 07/19/14   Quentin Angst, MD  budesonide-formoterol (SYMBICORT) 80-4.5 MCG/ACT inhaler Inhale 2 puffs into the lungs 2 (two) times daily. 07/19/14 07/19/15  Quentin Angst, MD  colchicine 0.6 MG tablet Take 1 tablet (0.6 mg  total) by mouth daily. 10/11/14   Quentin Angst, MD  cyclobenzaprine (FLEXERIL) 5 MG tablet Take 1 tablet (5 mg total) by mouth 3 (three) times daily as needed for muscle spasms. Patient not taking: Reported on 10/25/2014 10/11/14   Quentin Angst, MD  diazepam (VALIUM) 5 MG tablet Take 1 tablet (5 mg total) by mouth every 12 (twelve) hours as needed for muscle spasms. 11/01/14   Robyn M Hess, PA-C  EPINEPHrine 0.3 mg/0.3 mL IJ SOAJ injection Inject 0.3 mLs (0.3 mg total) into the muscle once. Patient not taking: Reported on 10/25/2014 09/10/14   Quentin Angst, MD  gabapentin (NEURONTIN) 300 MG capsule Take 1 capsule (300 mg total) by mouth 3 (three) times daily. 10/11/14   Quentin Angst, MD  glucose blood (TRUE METRIX BLOOD GLUCOSE TEST) test strip USE AS DIRECTED BY PHYSICIAN 07/31/14   Quentin Angst, MD  HYDROcodone-acetaminophen (NORCO/VICODIN) 5-325 MG per tablet Take 2 tablets by mouth every 4 (four) hours as needed. Patient not taking: Reported on 10/16/2014 08/15/14   Emilia Beck, PA-C  hydrOXYzine (ATARAX/VISTARIL) 10 MG tablet Take 1 tablet (10 mg total) by mouth 3 (three) times daily as needed. Patient taking differently: Take 10 mg by mouth 3 (three) times daily as needed for itching.  07/19/14   Quentin Angst, MD  metFORMIN (GLUCOPHAGE-XR) 750 MG 24 hr tablet Take 1 tablet (750 mg total) by mouth daily with breakfast. 10/11/14   Quentin Angst, MD  metoCLOPramide (REGLAN) 5 MG tablet Take 1 tablet (5 mg total) by mouth every 8 (eight) hours as needed for nausea or vomiting. 05/15/13   Quentin Angst, MD  nicotine (NICODERM CQ - DOSED IN MG/24 HOURS) 21 mg/24hr patch Place 1 patch (21 mg total) onto the skin daily. Patient not taking: Reported on 10/16/2014 10/11/14   Quentin Angst, MD  pantoprazole (PROTONIX) 40 MG tablet Take 1 tablet (40 mg total) by mouth daily. 10/11/14   Quentin Angst, MD  simvastatin (ZOCOR) 20 MG tablet Take 1 tablet (20 mg  total) by mouth at bedtime. 10/11/14   Quentin Angst, MD  TRUEPLUS LANCETS 26G MISC 1 each by Does not apply route 4 (four) times daily - after meals and at bedtime. 07/19/14   Quentin Angst, MD  valsartan-hydrochlorothiazide (DIOVAN-HCT) 160-12.5 MG per tablet Take 1 tablet by mouth daily. 10/11/14   Quentin Angst, MD  Vitamin D, Ergocalciferol, (DRISDOL) 50000 UNITS CAPS capsule Take 1 capsule (50,000 Units total) by mouth every 7 (seven) weeks. Sundays 10/11/14   Quentin Angst, MD    Allergies  Other; Aspirin; and Ace inhibitors  Triage Vitals: BP 149/87 mmHg  Pulse 75  Temp(Src) 98 F (36.7 C) (Oral)  Resp 18  Ht 5\' 6"  (1.676 m)  Wt 164 lb (74.39 kg)  BMI  26.48 kg/m2  SpO2 100%  Physical Exam  Constitutional: She is oriented to person, place, and time. She appears well-developed and well-nourished. No distress.  No smell of ketones  HENT:  Head: Normocephalic.  Mouth/Throat: Oropharynx is clear and moist.  Moist mucous membranes No exudate  Eyes: Pupils are equal, round, and reactive to light.  Neck: Normal range of motion. Neck supple.  Trachea midline  Cardiovascular: Normal rate and regular rhythm.   Pulmonary/Chest: Effort normal and breath sounds normal. No respiratory distress. She has no wheezes. She has no rales.  CTAB  Abdominal: Soft. Bowel sounds are normal. She exhibits no mass. There is no tenderness. There is no rebound and no guarding.  Musculoskeletal: Normal range of motion. She exhibits no edema.  Neurological: She is alert and oriented to person, place, and time. She has normal reflexes.  Skin: Skin is warm and dry.  Psychiatric: She has a normal mood and affect. Her behavior is normal.  Nursing note and vitals reviewed.   ED Course  Procedures   DIAGNOSTIC STUDIES: Oxygen Saturation is 100% on RA, normal by my interpretation.    COORDINATION OF CARE: 12:47 AM Discussed treatment plan which includes lab work with pt at bedside  and pt agreed to plan.  Labs Reviewed  BASIC METABOLIC PANEL - Abnormal; Notable for the following:    Glucose, Bld 100 (*)    Calcium 8.8 (*)    Anion gap 4 (*)    All other components within normal limits  CBC - Abnormal; Notable for the following:    WBC 10.7 (*)    All other components within normal limits  URINALYSIS, ROUTINE W REFLEX MICROSCOPIC (NOT AT [facility]) - Abnormal; Notable for the following:    Color, Urine STRAW (*)    APPearance CLOUDY (*)    Leukocytes, UA TRACE (*)    All other components within normal limits  URINE MICROSCOPIC-ADD ON - Abnormal; Notable for the following:    Squamous Epithelial / LPF FEW (*)    Bacteria, UA FEW (*)    All other components within normal limits  CBG MONITORING, ED - Abnormal; Notable for the following:    Glucose-Capillary 104 (*)    All other components within normal limits  CBG MONITORING, ED - Abnormal; Notable for the following:    Glucose-Capillary 100 (*)    All other components within normal limits  CBG MONITORING, ED    Imaging Review No results found.  I personally reviewed and evaluated these lab results as a part of my medical decision-making.    MDM   Final diagnoses:  None   Two normal sugars in the Ed and last meds taken 20 hours ago.  Suspect machine malfunction at home bring machine to PMD for check.   Continue medication as directed.    I, Myers Tutterow-RASCH,Nuel Dejaynes K, personally performed the services described in this documentation. All medical record entries made by the scribe were at my direction and in my presence.  I have reviewed the chart and discharge instructions and agree that the record reflects my personal performance and is accurate and complete. Wynn Alldredge-RASCH,Zierra Laroque K.  11/07/2014. 12:55 AM.      Calbert Hulsebus, MD 11/07/14 [phone]

## 2014-11-08 ENCOUNTER — Encounter: Payer: Self-pay | Admitting: Physical Therapy

## 2014-11-14 ENCOUNTER — Encounter: Payer: Self-pay | Admitting: Family Medicine

## 2014-11-14 ENCOUNTER — Other Ambulatory Visit: Payer: Self-pay | Admitting: Family Medicine

## 2014-11-14 ENCOUNTER — Ambulatory Visit: Payer: Medicaid Other | Attending: Family Medicine | Admitting: Family Medicine

## 2014-11-14 VITALS — BP 118/75 | HR 65 | Temp 98.1°F | Resp 18 | Ht 66.0 in | Wt 170.0 lb

## 2014-11-14 DIAGNOSIS — E785 Hyperlipidemia, unspecified: Secondary | ICD-10-CM | POA: Diagnosis not present

## 2014-11-14 DIAGNOSIS — R252 Cramp and spasm: Secondary | ICD-10-CM | POA: Insufficient documentation

## 2014-11-14 DIAGNOSIS — I10 Essential (primary) hypertension: Secondary | ICD-10-CM | POA: Diagnosis not present

## 2014-11-14 DIAGNOSIS — J449 Chronic obstructive pulmonary disease, unspecified: Secondary | ICD-10-CM | POA: Insufficient documentation

## 2014-11-14 DIAGNOSIS — F17210 Nicotine dependence, cigarettes, uncomplicated: Secondary | ICD-10-CM | POA: Insufficient documentation

## 2014-11-14 DIAGNOSIS — K649 Unspecified hemorrhoids: Secondary | ICD-10-CM | POA: Diagnosis not present

## 2014-11-14 DIAGNOSIS — E119 Type 2 diabetes mellitus without complications: Secondary | ICD-10-CM | POA: Diagnosis present

## 2014-11-14 DIAGNOSIS — K219 Gastro-esophageal reflux disease without esophagitis: Secondary | ICD-10-CM | POA: Insufficient documentation

## 2014-11-14 DIAGNOSIS — M62838 Other muscle spasm: Secondary | ICD-10-CM

## 2014-11-14 DIAGNOSIS — Z8249 Family history of ischemic heart disease and other diseases of the circulatory system: Secondary | ICD-10-CM | POA: Diagnosis not present

## 2014-11-14 DIAGNOSIS — Z7984 Long term (current) use of oral hypoglycemic drugs: Secondary | ICD-10-CM | POA: Insufficient documentation

## 2014-11-14 DIAGNOSIS — J45909 Unspecified asthma, uncomplicated: Secondary | ICD-10-CM | POA: Insufficient documentation

## 2014-11-14 DIAGNOSIS — Z79899 Other long term (current) drug therapy: Secondary | ICD-10-CM | POA: Insufficient documentation

## 2014-11-14 LAB — GLUCOSE, POCT (MANUAL RESULT ENTRY): POC Glucose: 95 mg/dl (ref 70–99)

## 2014-11-14 MED ORDER — HYDROCORTISONE ACETATE 25 MG RE SUPP: 25.0000 mg | Freq: Two times a day (BID) | RECTAL | Status: DC

## 2014-11-14 MED ORDER — TRUE METRIX METER DEVI: 1.0000 | Freq: Three times a day (TID) | Status: DC

## 2014-11-14 MED ORDER — HYDROCORTISONE 2.5 % RE CREA: 1.0000 "application " | TOPICAL_CREAM | Freq: Two times a day (BID) | RECTAL | Status: DC

## 2014-11-14 MED ORDER — GLUCOSE BLOOD VI STRP: ORAL_STRIP | Status: DC

## 2014-11-14 NOTE — Progress Notes (Signed)
CC:  HPI: Stacy [name] is a 54 y.o. female with a history of type 2 diabetes mellitus (A1c 6.4), COPD, GERD, hypertension, chronic back pain who comes in today complaining of hemorrhoids with associated bloody diarrhea and rectal pain which is 10/10 for the last 7 days. She has not used any OTC remedies and denies weakness, fatigue.  Complains of muscle spasms in her hands and her feet which are not new for which she sees orthopedics and has been undergoing physical therapy with no improvement in symptoms. Spasms caused her legs to give out and fall and as a result she ambulates with a walker. She had called her orthopedics to complain of this and was informed that the medication was sent to the pharmacy which she is yet to pick up. She also has another prescriptions for nicotine patch, EpiPen which she has not picked up due to finances She is also requesting a prescription for the meter, and testing supplies.  Allergies  Allergen Reactions  . Other Shortness Of Breath    Allergic to perfumes and cleaning products  . Aspirin Nausea Only    unknown  . Ace Inhibitors Other (See Comments)    Cough, wheeze   Past Medical History  Diagnosis Date  . Hypertension     Pt on lisinopril  . GERD (gastroesophageal reflux disease)     Pt on Protonix daily  . Arthritis     Takes Naprosyn   . Diabetes mellitus     Takes Metformin 750 mg  . Hyperlipidemia   . Asthma   . COPD (chronic obstructive pulmonary disease) (HCC)   . Shortness of breath     occasional - uses breathing tx at home  . Headache(784.0)     otc meds prn  . Irritable bowel syndrome 11/19/2010   Current Outpatient Prescriptions on File Prior to Visit  Medication Sig Dispense Refill  . acetaminophen-codeine (TYLENOL #3) 300-30 MG per tablet Take 1 tablet by mouth every 4 (four) hours as needed. 90 tablet 0  . albuterol (PROVENTIL) (2.5 MG/3ML) 0.083% nebulizer solution Take 3 mLs (2.5 mg total) by nebulization every 4  (four) hours as needed for wheezing. 75 mL 3  . cyclobenzaprine (FLEXERIL) 5 MG tablet Take 1 tablet (5 mg total) by mouth 3 (three) times daily as needed for muscle spasms. 30 tablet 3  . diazepam (VALIUM) 5 MG tablet Take 1 tablet (5 mg total) by mouth every 12 (twelve) hours as needed for muscle spasms. 5 tablet 0  . EPINEPHrine 0.3 mg/0.3 mL IJ SOAJ injection Inject 0.3 mLs (0.3 mg total) into the muscle once. 1 Device 1  . gabapentin (NEURONTIN) 300 MG capsule Take 1 capsule (300 mg total) by mouth 3 (three) times daily. 270 capsule 3  . glucose blood (TRUE METRIX BLOOD GLUCOSE TEST) test strip USE AS DIRECTED BY PHYSICIAN 100 each 12  . HYDROcodone-acetaminophen (NORCO/VICODIN) 5-325 MG per tablet Take 2 tablets by mouth every 4 (four) hours as needed. 15 tablet 0  . hydrOXYzine (ATARAX/VISTARIL) 10 MG tablet Take 1 tablet (10 mg total) by mouth 3 (three) times daily as needed. (Patient taking differently: Take 10 mg by mouth 3 (three) times daily as needed for itching. ) 30 tablet 3  . metFORMIN (GLUCOPHAGE-XR) 750 MG 24 hr tablet Take 1 tablet (750 mg total) by mouth daily with breakfast. 90 tablet 3  . metoCLOPramide (REGLAN) 5 MG tablet Take 1 tablet (5 mg total) by mouth every 8 (eight) hours as  needed for nausea or vomiting. 90 tablet 0  . nicotine (NICODERM CQ - DOSED IN MG/24 HOURS) 21 mg/24hr patch Place 1 patch (21 mg total) onto the skin daily. 28 patch 0  . pantoprazole (PROTONIX) 40 MG tablet Take 1 tablet (40 mg total) by mouth daily. 90 tablet 3  . simvastatin (ZOCOR) 20 MG tablet Take 1 tablet (20 mg total) by mouth at bedtime. 90 tablet 3  . valsartan-hydrochlorothiazide (DIOVAN-HCT) 160-12.5 MG per tablet Take 1 tablet by mouth daily. 90 tablet 3  . Vitamin D, Ergocalciferol, (DRISDOL) 50000 UNITS CAPS capsule Take 1 capsule (50,000 Units total) by mouth every 7 (seven) weeks. Sundays 12 capsule 0  . allopurinol (ZYLOPRIM) 100 MG tablet Take 1 tablet (100 mg total) by mouth  daily. 90 tablet 3  . budesonide-formoterol (SYMBICORT) 80-4.5 MCG/ACT inhaler Inhale 2 puffs into the lungs 2 (two) times daily. 3 Inhaler 3  . colchicine 0.6 MG tablet Take 1 tablet (0.6 mg total) by mouth daily. 90 tablet 3  . [DISCONTINUED] Fluticasone-Salmeterol (ADVAIR) 500-50 MCG/DOSE AEPB Inhale 1 puff into the lungs every 12 (twelve) hours.      . [DISCONTINUED] hydrochlorothiazide (,MICROZIDE/HYDRODIURIL,) 12.5 MG capsule Take 12.5 mg by mouth daily.      . [DISCONTINUED] lisinopril (PRINIVIL,ZESTRIL) 40 MG tablet Take 40 mg by mouth daily.       No current facility-administered medications on file prior to visit.   Family History  Problem Relation Age of Onset  . Asthma Son     had as a child  . Heart disease Sister   . Breast cancer Sister   . Hypertension Father   . Cancer Father   . Hypertension Brother    Social History   Social History  . Marital Status: Married    Spouse Name: N/A  . Number of Children: 2  . Years of Education: N/A   Occupational History  . unemployed    Social History Main Topics  . Smoking status: Current Every Day Smoker -- 0.50 packs/day for 10 years    Types: Cigarettes  . Smokeless tobacco: Never Used  . Alcohol Use: No  . Drug Use: No  . Sexual Activity: Yes    Birth Control/ Protection: Surgical   Other Topics Concern  . Not on file   Social History Narrative    Review of Systems: Constitutional: Negative for fever, chills, diaphoresis, activity change, appetite change and fatigue. HENT: Negative for ear pain, nosebleeds, congestion, facial swelling, rhinorrhea, neck pain, neck stiffness and ear discharge.  Eyes: Negative for pain, discharge, redness, itching and visual disturbance. Respiratory: Negative for cough, choking, chest tightness, shortness of breath, wheezing and stridor.  Cardiovascular: Negative for chest pain, palpitations and leg swelling. Gastrointestinal: Negative for abdominal distention, positive for  bloody diarrhea. Genitourinary: Negative for dysuria, urgency, frequency, hematuria, flank pain, decreased urine volume, difficulty urinating and dyspareunia.  Musculoskeletal: See history of present illness Neurological: Negative for dizziness, tremors, seizures, syncope, facial asymmetry, speech difficulty, weakness, light-headedness, numbness and headaches.  Hematological: Negative for adenopathy. Does not bruise/bleed easily. Psychiatric/Behavioral: Negative for hallucinations, behavioral problems, confusion, dysphoric mood, decreased concentration and agitation.    Objective:   Filed Vitals:   11/14/14 1446  BP: 118/75  Pulse: 65  Temp: 98.1 F (36.7 C)  Resp: 18    Physical Exam: Constitutional: Patient appears well-developed and well-nourished. No distress. CVS: RRR, S1/S2 +, no murmurs, no gallops, no carotid bruit.  Pulmonary: Effort and breath sounds normal, no stridor, rhonchi,  wheezes, rales.  Abdominal: Soft. BS +,  no distension, tenderness, rebound or guarding.  Musculoskeletal: Normal range of motion. No edema and no tenderness.  Lymphadenopathy: No lymphadenopathy noted, cervical, inguinal or axillary Neuro: Alert. Normal reflexes, muscle tone coordination. No cranial nerve deficit. Skin: Skin is warm and dry. No rash noted. Not diaphoretic. No erythema. No pallor. Psychiatric: Normal mood and affect. Behavior, judgment, thought content normal.  Lab Results  Component Value Date   WBC 10.7* 11/06/2014   HGB 12.1 11/06/2014   HCT 36.2 11/06/2014   MCV 83.8 11/06/2014   PLT 234 11/06/2014   Lab Results  Component Value Date   CREATININE 0.80 11/06/2014   BUN 16 11/06/2014   NA 141 11/06/2014   K 4.2 11/06/2014   CL 108 11/06/2014   CO2 29 11/06/2014    Lab Results  Component Value Date   HGBA1C 6.40 10/11/2014   Lipid Panel     Component Value Date/Time   CHOL 194 07/19/2014 1202   TRIG 118 07/19/2014 1202   HDL 46 07/19/2014 1202   CHOLHDL 4.2  07/19/2014 1202   VLDL 24 07/19/2014 1202   LDLCALC 124* 07/19/2014 1202       Assessment and plan:  Hemorrhoids: Placed on Anusol-HC Advised to perform Sitz baths Dietary modification to avoid constipation  If conservative measures fail she will need surgical management.  Diabetes mellitus: Controlled with A1c of 6.4 Testing supplies written and sent to the pharmacy  Muscle cramps: Advised to pick up prescriptions from the pharmacy Continue physical therapy as per protocol.       Jaclyn Shaggy, MD. [facility] [phone] 11/14/2014, 3:22 PM

## 2014-11-14 NOTE — Progress Notes (Signed)
Pt's here for f/up for hemorrhoids rate pain at 10. Pt's concern about cramping in her hands and feet. Pt reports spasm in her legs. Pt needs med refill. Pt needs new meter and test strips and lancet. Ptt needs epi-pen and another Nicotine patch rx.

## 2014-11-14 NOTE — Patient Instructions (Signed)
Hemorrhoids Hemorrhoids are swollen veins around the rectum or anus. There are two types of hemorrhoids:   Internal hemorrhoids. These occur in the veins just inside the rectum. They may poke through to the outside and become irritated and painful.  External hemorrhoids. These occur in the veins outside the anus and can be felt as a painful swelling or hard lump near the anus. CAUSES  Pregnancy.   Obesity.   Constipation or diarrhea.   Straining to have a bowel movement.   Sitting for long periods on the toilet.  Heavy lifting or other activity that caused you to strain.  Anal intercourse. SYMPTOMS   Pain.   Anal itching or irritation.   Rectal bleeding.   Fecal leakage.   Anal swelling.   One or more lumps around the anus.  DIAGNOSIS  Your caregiver may be able to diagnose hemorrhoids by visual examination. Other examinations or tests that may be performed include:   Examination of the rectal area with a gloved hand (digital rectal exam).   Examination of anal canal using a small tube (scope).   A blood test if you have lost a significant amount of blood.  A test to look inside the colon (sigmoidoscopy or colonoscopy). TREATMENT Most hemorrhoids can be treated at home. However, if symptoms do not seem to be getting better or if you have a lot of rectal bleeding, your caregiver may perform a procedure to help make the hemorrhoids get smaller or remove them completely. Possible treatments include:   Placing a rubber band at the base of the hemorrhoid to cut off the circulation (rubber band ligation).   Injecting a chemical to shrink the hemorrhoid (sclerotherapy).   Using a tool to burn the hemorrhoid (infrared light therapy).   Surgically removing the hemorrhoid (hemorrhoidectomy).   Stapling the hemorrhoid to block blood flow to the tissue (hemorrhoid stapling).  HOME CARE INSTRUCTIONS   Eat foods with fiber, such as whole grains, beans,  nuts, fruits, and vegetables. Ask your doctor about taking products with added fiber in them (fibersupplements).  Increase fluid intake. Drink enough water and fluids to keep your urine clear or pale yellow.   Exercise regularly.   Go to the bathroom when you have the urge to have a bowel movement. Do not wait.   Avoid straining to have bowel movements.   Keep the anal area dry and clean. Use wet toilet paper or moist towelettes after a bowel movement.   Medicated creams and suppositories may be used or applied as directed.   Only take over-the-counter or prescription medicines as directed by your caregiver.   Take warm sitz baths for 15-20 minutes, 3-4 times a day to ease pain and discomfort.   Place ice packs on the hemorrhoids if they are tender and swollen. Using ice packs between sitz baths may be helpful.   Put ice in a plastic bag.   Place a towel between your skin and the bag.   Leave the ice on for 15-20 minutes, 3-4 times a day.   Do not use a donut-shaped pillow or sit on the toilet for long periods. This increases blood pooling and pain.  SEEK MEDICAL CARE IF:  You have increasing pain and swelling that is not controlled by treatment or medicine.  You have uncontrolled bleeding.  You have difficulty or you are unable to have a bowel movement.  You have pain or inflammation outside the area of the hemorrhoids. MAKE SURE YOU:  Understand these instructions.    Will watch your condition.  Will get help right away if you are not doing well or get worse.   This information is not intended to replace advice given to you by your health care provider. Make sure you discuss any questions you have with your health care provider.   Document Released: 01/17/2000 Document Revised: 01/06/2012 Document Reviewed: 11/24/2011 Elsevier Interactive Patient Education 2016 [employer]  

## 2014-11-16 ENCOUNTER — Other Ambulatory Visit: Payer: Self-pay | Admitting: Pharmacist

## 2014-11-16 MED ORDER — HYDROCORTISONE ACE-PRAMOXINE 2.5-1 % RE CREA: 1.0000 "application " | TOPICAL_CREAM | Freq: Two times a day (BID) | RECTAL | Status: DC

## 2014-11-19 ENCOUNTER — Telehealth: Payer: Self-pay | Admitting: Physical Therapy

## 2014-11-19 NOTE — Telephone Encounter (Signed)
Stacy [name] called Stacy LahLawrie Kemya [name] PT and asked her to return call.  [name] reports that she has fallen over 25 x since last contacted and that she has not heard from Dr. Barbaraann FasterNitka's office about a neuorology referral.  Pt was reminded that she was to call Dr. Otelia SergeantNitka if she had not heard later that same day.  Pt also reports that her relative (father-in- law?) died this past weekeend.   Pt was told to call Dr. Otelia SergeantNitka about state of referral today.  PT called and left message for Stacy [name] at Dr. Barbaraann FasterNitka's office about status of pt and about additional falls.  Secretary assured PT that message would be given to Stacy [name]/Dr. Otelia SergeantNitka and that in fact neurology consult had been received.    Dr. Otelia SergeantNitka office will contact Stacy [name] for further workup.  Stacy [name], PT 11/19/2014 3:04 PM Phone: [phone] Fax: [phone]

## 2014-11-21 ENCOUNTER — Emergency Department (HOSPITAL_COMMUNITY)
Admission: EM | Admit: 2014-11-21 | Discharge: 2014-11-21 | Disposition: A | Discharge status: Home / Self Care | Payer: Medicaid Other | Attending: Emergency Medicine | Admitting: Emergency Medicine

## 2014-11-21 ENCOUNTER — Encounter (HOSPITAL_COMMUNITY): Payer: Self-pay

## 2014-11-21 DIAGNOSIS — E119 Type 2 diabetes mellitus without complications: Secondary | ICD-10-CM | POA: Insufficient documentation

## 2014-11-21 DIAGNOSIS — R5383 Other fatigue: Secondary | ICD-10-CM | POA: Diagnosis not present

## 2014-11-21 DIAGNOSIS — Z720 Tobacco use: Secondary | ICD-10-CM | POA: Insufficient documentation

## 2014-11-21 DIAGNOSIS — E785 Hyperlipidemia, unspecified: Secondary | ICD-10-CM | POA: Insufficient documentation

## 2014-11-21 DIAGNOSIS — Z7951 Long term (current) use of inhaled steroids: Secondary | ICD-10-CM | POA: Insufficient documentation

## 2014-11-21 DIAGNOSIS — K219 Gastro-esophageal reflux disease without esophagitis: Secondary | ICD-10-CM | POA: Insufficient documentation

## 2014-11-21 DIAGNOSIS — K529 Noninfective gastroenteritis and colitis, unspecified: Secondary | ICD-10-CM | POA: Insufficient documentation

## 2014-11-21 DIAGNOSIS — M1990 Unspecified osteoarthritis, unspecified site: Secondary | ICD-10-CM | POA: Insufficient documentation

## 2014-11-21 DIAGNOSIS — R109 Unspecified abdominal pain: Secondary | ICD-10-CM | POA: Diagnosis present

## 2014-11-21 DIAGNOSIS — Z79899 Other long term (current) drug therapy: Secondary | ICD-10-CM | POA: Insufficient documentation

## 2014-11-21 DIAGNOSIS — J449 Chronic obstructive pulmonary disease, unspecified: Secondary | ICD-10-CM | POA: Insufficient documentation

## 2014-11-21 DIAGNOSIS — Z9851 Tubal ligation status: Secondary | ICD-10-CM | POA: Insufficient documentation

## 2014-11-21 DIAGNOSIS — I10 Essential (primary) hypertension: Secondary | ICD-10-CM | POA: Insufficient documentation

## 2014-11-21 DIAGNOSIS — Z9049 Acquired absence of other specified parts of digestive tract: Secondary | ICD-10-CM | POA: Insufficient documentation

## 2014-11-21 LAB — COMPREHENSIVE METABOLIC PANEL
ALT: 17 U/L (ref 14–54)
AST: 23 U/L (ref 15–41)
Albumin: 4.5 g/dL (ref 3.5–5.0)
Alkaline Phosphatase: 93 U/L (ref 38–126)
Anion gap: 11 (ref 5–15)
BUN: 10 mg/dL (ref 6–20)
CO2: 27 mmol/L (ref 22–32)
Calcium: 9.6 mg/dL (ref 8.9–10.3)
Chloride: 95 mmol/L — ABNORMAL LOW (ref 101–111)
Creatinine, Ser: 0.9 mg/dL (ref 0.44–1.00)
GFR calc Af Amer: >60 mL/min (ref >60)
GFR calc non Af Amer: >60 mL/min (ref >60)
Glucose, Bld: 113 mg/dL — ABNORMAL HIGH (ref 65–99)
Potassium: 3.1 mmol/L — ABNORMAL LOW (ref 3.5–5.1)
Sodium: 133 mmol/L — ABNORMAL LOW (ref 135–145)
Total Bilirubin: 0.6 mg/dL (ref 0.3–1.2)
Total Protein: 8.3 g/dL — ABNORMAL HIGH (ref 6.5–8.1)

## 2014-11-21 LAB — CBC
HCT: 40 % (ref 36.0–46.0)
Hemoglobin: 13.8 g/dL (ref 12.0–15.0)
MCH: 28.1 pg (ref 26.0–34.0)
MCHC: 34.5 g/dL (ref 30.0–36.0)
MCV: 81.5 fL (ref 78.0–100.0)
Platelets: 267 10*3/uL (ref 150–400)
RBC: 4.91 MIL/uL (ref 3.87–5.11)
RDW: 13.7 % (ref 11.5–15.5)
WBC: 5.6 10*3/uL (ref 4.0–10.5)

## 2014-11-21 LAB — URINALYSIS, ROUTINE W REFLEX MICROSCOPIC
Bilirubin Urine: NEGATIVE
Glucose, UA: NEGATIVE mg/dL
Hgb urine dipstick: NEGATIVE
Ketones, ur: NEGATIVE mg/dL
Leukocytes, UA: NEGATIVE
Nitrite: NEGATIVE
Protein, ur: NEGATIVE mg/dL
Specific Gravity, Urine: 1.003 — ABNORMAL LOW (ref 1.005–1.030)
Urobilinogen, UA: 0.2 mg/dL (ref 0.0–1.0)
pH: 6 (ref 5.0–8.0)

## 2014-11-21 LAB — LIPASE, BLOOD: Lipase: 30 U/L (ref 22–51)

## 2014-11-21 LAB — I-STAT CG4 LACTIC ACID, ED: Lactic Acid, Venous: 1.6 mmol/L (ref 0.5–2.0)

## 2014-11-21 MED ORDER — ONDANSETRON HCL 4 MG/2ML IJ SOLN
4.0000 mg | Freq: Once | INTRAMUSCULAR | Status: AC
  Administered 2014-11-21: 4 mg via INTRAVENOUS
  Filled 2014-11-21: qty 2

## 2014-11-21 MED ORDER — SODIUM CHLORIDE 0.9 % IV BOLUS (SEPSIS): 1000.0000 mL | Freq: Once | INTRAVENOUS | Status: DC

## 2014-11-21 MED ORDER — ONDANSETRON 4 MG PO TBDP: 4.0000 mg | ORAL_TABLET | Freq: Three times a day (TID) | ORAL | Status: DC | PRN

## 2014-11-21 MED ORDER — SODIUM CHLORIDE 0.9 % IV BOLUS (SEPSIS)
1000.0000 mL | Freq: Once | INTRAVENOUS | Status: AC
  Administered 2014-11-21: 1000 mL via INTRAVENOUS

## 2014-11-21 MED ORDER — POTASSIUM CHLORIDE CRYS ER 20 MEQ PO TBCR
40.0000 meq | EXTENDED_RELEASE_TABLET | Freq: Two times a day (BID) | ORAL | Status: DC
  Administered 2014-11-21: 40 meq via ORAL
  Filled 2014-11-21: qty 2

## 2014-11-21 MED ORDER — SODIUM CHLORIDE 0.9 % IV SOLN
Freq: Once | INTRAVENOUS | Status: AC
  Administered 2014-11-21: 1000 mL via INTRAVENOUS

## 2014-11-21 NOTE — ED Notes (Signed)
Patient c/o intermittent lower abdominal pain, diarrhea, and vomiting x 2 weeks. Patient states history of IBS.

## 2014-11-21 NOTE — ED Notes (Signed)
Pt states "I feel so much better.  The crackers and the water stayed down."

## 2014-11-21 NOTE — ED Notes (Signed)
MD at bedside. 

## 2014-11-21 NOTE — ED Provider Notes (Signed)
CSN: [id number]     Arrival date & time 11/21/14  1409 History   First MD Initiated Contact with Patient 11/21/14 1625     Chief Complaint  Patient presents with  . Abdominal Pain  . Diarrhea  . Emesis     (Consider location/radiation/quality/duration/timing/severity/associated sxs/prior Treatment) HPI Comments: 54 y.o. Female with history of HTN, GERD, DM, hyperlipidemia, COPD, IBS presents for nausea, vomiting, diarrhea.  The patient states the symptoms have been going on for over 1 week and that she saw her PCP for this issue and was given prescriptions for hemorrhoids and nausea medicine but that she has not been able to hold down the nausea medicine.  She reports that her whole stomach feels sore but denies any area of pain.  She denies fever.  No known sick contacts.  No recent travel or antibiotic use.  Has not been hospitalized. The diarrhea has had some small flecks of blood in it recently and she does have a history of hemorrhoids.  She states that these symptoms feel similar to something she had with her IBS multiple years ago.   Past Medical History  Diagnosis Date  . Hypertension     Pt on lisinopril  . GERD (gastroesophageal reflux disease)     Pt on Protonix daily  . Arthritis     Takes Naprosyn   . Diabetes mellitus     Takes Metformin 750 mg  . Hyperlipidemia   . Asthma   . COPD (chronic obstructive pulmonary disease) (HCC)   . Shortness of breath     occasional - uses breathing tx at home  . Headache(784.0)     otc meds prn  . Irritable bowel syndrome 11/19/2010   Past Surgical History  Procedure Laterality Date  . Hernia repair    . Cholecystectomy    . Svd       x 2  . Tubal ligation    . Endometrial ablation  10/2010  . Upper gastrointestinal endoscopy  04/28/11   Family History  Problem Relation Age of Onset  . Asthma Son     had as a child  . Heart disease Sister   . Breast cancer Sister   . Hypertension Father   . Cancer Father   .  Hypertension Brother    Social History  Substance Use Topics  . Smoking status: Current Every Day Smoker -- 1.00 packs/day for 10 years    Types: Cigarettes  . Smokeless tobacco: Never Used  . Alcohol Use: No   OB History    No data available     Review of Systems  Constitutional: Positive for fatigue. Negative for fever, chills and appetite change.  HENT: Negative for congestion, postnasal drip, rhinorrhea and sinus pressure.   Eyes: Negative for pain and redness.  Respiratory: Negative for cough, chest tightness and shortness of breath.   Cardiovascular: Negative for chest pain and palpitations.  Gastrointestinal: Positive for nausea, vomiting, abdominal pain (soreness) and diarrhea. Negative for constipation.  Genitourinary: Negative for dysuria, urgency, flank pain and decreased urine volume.  Musculoskeletal: Negative for myalgias and back pain.  Skin: Negative for rash.  Neurological: Negative for dizziness, weakness, light-headedness, numbness and headaches.  Hematological: Does not bruise/bleed easily.      Allergies  Other; Aspirin; and Ace inhibitors  Home Medications   Prior to Admission medications   Medication Sig Start Date End Date Taking? Authorizing Provider  albuterol (PROVENTIL) (2.5 MG/3ML) 0.083% nebulizer solution Take 3 mLs (2.5 mg total)  by nebulization every 4 (four) hours as needed for wheezing. 07/19/14  Yes Quentin Angst, MD  allopurinol (ZYLOPRIM) 100 MG tablet Take 1 tablet (100 mg total) by mouth daily. 10/11/14  Yes Quentin Angst, MD  budesonide-formoterol (SYMBICORT) 80-4.5 MCG/ACT inhaler Inhale 2 puffs into the lungs 2 (two) times daily. 07/19/14 07/19/15 Yes Olugbemiga Annitta Needs, MD  colchicine 0.6 MG tablet Take 1 tablet (0.6 mg total) by mouth daily. 10/11/14  Yes Quentin Angst, MD  cyclobenzaprine (FLEXERIL) 5 MG tablet Take 1 tablet (5 mg total) by mouth 3 (three) times daily as needed for muscle spasms. 10/11/14  Yes Quentin Angst, MD  diazepam (VALIUM) 5 MG tablet Take 1 tablet (5 mg total) by mouth every 12 (twelve) hours as needed for muscle spasms. 11/01/14  Yes Robyn M Hess, PA-C  EPINEPHrine 0.3 mg/0.3 mL IJ SOAJ injection Inject 0.3 mLs (0.3 mg total) into the muscle once. 09/10/14  Yes Quentin Angst, MD  gabapentin (NEURONTIN) 300 MG capsule Take 1 capsule (300 mg total) by mouth 3 (three) times daily. 10/11/14  Yes Quentin Angst, MD  glucose blood (TRUE METRIX BLOOD GLUCOSE TEST) test strip USE AS DIRECTED BY PHYSICIAN 07/31/14  Yes Quentin Angst, MD  glucose blood (TRUE METRIX BLOOD GLUCOSE TEST) test strip Use as instructed before meals and at bedtime 11/14/14  Yes Jaclyn Shaggy, MD  HYDROcodone-acetaminophen (NORCO) 7.5-325 MG tablet Take 1-2 tablets by mouth every 4 (four) hours as needed. pain 11/05/14  Yes Historical Provider, MD  hydrOXYzine (ATARAX/VISTARIL) 10 MG tablet Take 1 tablet (10 mg total) by mouth 3 (three) times daily as needed. Patient taking differently: Take 10 mg by mouth 3 (three) times daily as needed for itching.  07/19/14  Yes Quentin Angst, MD  metFORMIN (GLUCOPHAGE-XR) 750 MG 24 hr tablet Take 1 tablet (750 mg total) by mouth daily with breakfast. 10/11/14  Yes Quentin Angst, MD  metoCLOPramide (REGLAN) 5 MG tablet Take 1 tablet (5 mg total) by mouth every 8 (eight) hours as needed for nausea or vomiting. 05/15/13  Yes Quentin Angst, MD  nicotine (NICODERM CQ - DOSED IN MG/24 HOURS) 21 mg/24hr patch Place 1 patch (21 mg total) onto the skin daily. 10/11/14  Yes Quentin Angst, MD  pantoprazole (PROTONIX) 40 MG tablet Take 1 tablet (40 mg total) by mouth daily. 10/11/14  Yes Quentin Angst, MD  simvastatin (ZOCOR) 20 MG tablet Take 1 tablet (20 mg total) by mouth at bedtime. 10/11/14  Yes Quentin Angst, MD  valsartan-hydrochlorothiazide (DIOVAN-HCT) 160-12.5 MG per tablet Take 1 tablet by mouth daily. 10/11/14  Yes Quentin Angst, MD  Vitamin D,  Ergocalciferol, (DRISDOL) 50000 UNITS CAPS capsule Take 1 capsule (50,000 Units total) by mouth every 7 (seven) weeks. Sundays 10/11/14  Yes Olugbemiga Annitta Needs, MD  acetaminophen-codeine (TYLENOL #3) 300-30 MG per tablet Take 1 tablet by mouth every 4 (four) hours as needed. Patient not taking: Reported on 11/21/2014 10/11/14   Quentin Angst, MD  Blood Glucose Monitoring Suppl (TRUE METRIX METER) DEVI 1 Device by Does not apply route 4 (four) times daily -  before meals and at bedtime. 11/14/14   Jaclyn Shaggy, MD  HYDROcodone-acetaminophen (NORCO/VICODIN) 5-325 MG per tablet Take 2 tablets by mouth every 4 (four) hours as needed. Patient not taking: Reported on 11/21/2014 08/15/14   Emilia Beck, PA-C  hydrocortisone-pramoxine [facility]) 2.5-1 % rectal cream Place 1 application rectally 2 (two) times daily. Patient  not taking: Reported on 11/21/2014 11/16/14   Jaclyn Shaggy, MD   BP 101/74 mmHg  Pulse 62  Temp(Src) 97.8 F (36.6 C) (Oral)  Resp 16  Ht  (1.676 m)  Wt 170 lb (77.111 kg)  BMI 27.45 kg/m2  SpO2 100% Physical Exam  Constitutional: She is oriented to person, place, and time. She appears well-developed and well-nourished. No distress.  HENT:  Head: Normocephalic and atraumatic.  Right Ear: External ear normal.  Left Ear: External ear normal.  Nose: Nose normal.  Mouth/Throat: Oropharynx is clear and moist. Mucous membranes are dry. No oropharyngeal exudate.  Eyes: EOM are normal. Pupils are equal, round, and reactive to light.  Neck: Normal range of motion. Neck supple.  Cardiovascular: Normal rate, regular rhythm, normal heart sounds and intact distal pulses.   No murmur heard. Pulmonary/Chest: Effort normal. No respiratory distress. She has no wheezes. She has no rales.  Abdominal: Soft. She exhibits no distension. There is no tenderness.  Musculoskeletal: Normal range of motion. She exhibits no edema or tenderness.  Neurological: She is alert and oriented  to person, place, and time.  Skin: Skin is warm and dry. No rash noted. She is not diaphoretic.  Vitals reviewed.   ED Course  Procedures (including critical care time) Labs Review Labs Reviewed  COMPREHENSIVE METABOLIC PANEL - Abnormal; Notable for the following:    Sodium 133 (*)    Potassium 3.1 (*)    Chloride 95 (*)    Glucose, Bld 113 (*)    Total Protein 8.3 (*)    All other components within normal limits  URINALYSIS, ROUTINE W REFLEX MICROSCOPIC (NOT AT [facility]) - Abnormal; Notable for the following:    Specific Gravity, Urine 1.003 (*)    All other components within normal limits  STOOL CULTURE  C DIFFICILE QUICK SCREEN W PCR REFLEX  OVA AND PARASITE EXAMINATION  LIPASE, BLOOD  CBC  I-STAT CG4 LACTIC ACID, ED    Imaging Review No results found. I have personally reviewed and evaluated these images and lab results as part of my medical decision-making.   EKG Interpretation None      MDM  Patient was seen and evaluated in stable condition.  Benign examination other than dry mucous membranes.  Abdomen soft without tenderness.  Patient was given IV fluids and Zofran.  Laboratory results showed hypokalemia which was supplemented and mild hyponatremia but were otherwise unremarkable.  On reevaluation patient felt significantly improved.  She tolerated PO food and drink and was discharged home in stable condition with strict return precautions, prescription for Zofran, and instruction to follow up with her PCP. Final diagnoses:  None    1. Gastroenteritis    Leta Baptist, MD 11/22/14 [phone]

## 2014-11-21 NOTE — ED Notes (Signed)
Pt provided saltines.

## 2014-11-21 NOTE — Discharge Instructions (Signed)
Viral Gastroenteritis  Your nausea, vomiting, diarrhea seems most consistent with a stomach virus or illness but could also be related to your IBS.  Take the nausea medicine as prescribed and follow up with your primary care physician.  Return if you are not able to hold down liquids or food.  Rest as much as you can and keep yourself as well hydrated as possible with water, clear sodas, gatorade, pedialyte, or similar beverages.  Viral gastroenteritis is also known as stomach flu. This condition affects the stomach and intestinal tract. It can cause sudden diarrhea and vomiting. The illness typically lasts 3 to 8 days. Most people develop an immune response that eventually gets rid of the virus. While this natural response develops, the virus can make you quite ill. CAUSES  Many different viruses can cause gastroenteritis, such as rotavirus or noroviruses. You can catch one of these viruses by consuming contaminated food or water. You may also catch a virus by sharing utensils or other personal items with an infected person or by touching a contaminated surface. SYMPTOMS  The most common symptoms are diarrhea and vomiting. These problems can cause a severe loss of body fluids (dehydration) and a body salt (electrolyte) imbalance. Other symptoms may include:  Fever.  Headache.  Fatigue.  Abdominal pain. DIAGNOSIS  Your caregiver can usually diagnose viral gastroenteritis based on your symptoms and a physical exam. A stool sample may also be taken to test for the presence of viruses or other infections. TREATMENT  This illness typically goes away on its own. Treatments are aimed at rehydration. The most serious cases of viral gastroenteritis involve vomiting so severely that you are not able to keep fluids down. In these cases, fluids must be given through an intravenous line (IV). HOME CARE INSTRUCTIONS   Drink enough fluids to keep your urine clear or pale yellow. Drink small amounts of  fluids frequently and increase the amounts as tolerated.  Ask your caregiver for specific rehydration instructions.  Avoid:  Foods high in sugar.  Alcohol.  Carbonated drinks.  Tobacco.  Juice.  Caffeine drinks.  Extremely hot or cold fluids.  Fatty, greasy foods.  Too much intake of anything at one time.  Dairy products until 24 to 48 hours after diarrhea stops.  You may consume probiotics. Probiotics are active cultures of beneficial bacteria. They may lessen the amount and number of diarrheal stools in adults. Probiotics can be found in yogurt with active cultures and in supplements.  Wash your hands well to avoid spreading the virus.  Only take over-the-counter or prescription medicines for pain, discomfort, or fever as directed by your caregiver. Do not give aspirin to children. Antidiarrheal medicines are not recommended.  Ask your caregiver if you should continue to take your regular prescribed and over-the-counter medicines.  Keep all follow-up appointments as directed by your caregiver. SEEK IMMEDIATE MEDICAL CARE IF:   You are unable to keep fluids down.  You do not urinate at least once every 6 to 8 hours.  You develop shortness of breath.  You notice blood in your stool or vomit. This may look like coffee grounds.  You have abdominal pain that increases or is concentrated in one small area (localized).  You have persistent vomiting or diarrhea.  You have a fever.  The patient is a child younger than 3 months, and he or she has a fever.  The patient is a child older than 3 months, and he or she has a fever and persistent  symptoms.  The patient is a child older than 3 months, and he or she has a fever and symptoms suddenly get worse.  The patient is a baby, and he or she has no tears when crying. MAKE SURE YOU:   Understand these instructions.  Will watch your condition.  Will get help right away if you are not doing well or get worse.     This information is not intended to replace advice given to you by your health care provider. Make sure you discuss any questions you have with your health care provider.   Document Released: 01/19/2005 Document Revised: 04/13/2011 Document Reviewed: 11/05/2010 Elsevier Interactive Patient Education [employer]

## 2014-11-29 ENCOUNTER — Encounter: Payer: Self-pay | Admitting: Internal Medicine

## 2014-11-29 ENCOUNTER — Ambulatory Visit: Payer: Medicaid Other | Attending: Internal Medicine | Admitting: Internal Medicine

## 2014-11-29 VITALS — BP 108/73 | HR 78 | Temp 98.0°F | Resp 18 | Ht 66.0 in | Wt 165.5 lb

## 2014-11-29 DIAGNOSIS — E785 Hyperlipidemia, unspecified: Secondary | ICD-10-CM | POA: Insufficient documentation

## 2014-11-29 DIAGNOSIS — I10 Essential (primary) hypertension: Secondary | ICD-10-CM | POA: Insufficient documentation

## 2014-11-29 DIAGNOSIS — M545 Low back pain, unspecified: Secondary | ICD-10-CM

## 2014-11-29 DIAGNOSIS — J4530 Mild persistent asthma, uncomplicated: Secondary | ICD-10-CM | POA: Diagnosis not present

## 2014-11-29 DIAGNOSIS — Z7984 Long term (current) use of oral hypoglycemic drugs: Secondary | ICD-10-CM | POA: Insufficient documentation

## 2014-11-29 DIAGNOSIS — Z8249 Family history of ischemic heart disease and other diseases of the circulatory system: Secondary | ICD-10-CM | POA: Diagnosis not present

## 2014-11-29 DIAGNOSIS — R296 Repeated falls: Secondary | ICD-10-CM | POA: Insufficient documentation

## 2014-11-29 DIAGNOSIS — Z1211 Encounter for screening for malignant neoplasm of colon: Secondary | ICD-10-CM

## 2014-11-29 DIAGNOSIS — F17210 Nicotine dependence, cigarettes, uncomplicated: Secondary | ICD-10-CM | POA: Diagnosis not present

## 2014-11-29 DIAGNOSIS — K589 Irritable bowel syndrome without diarrhea: Secondary | ICD-10-CM | POA: Diagnosis not present

## 2014-11-29 DIAGNOSIS — J449 Chronic obstructive pulmonary disease, unspecified: Secondary | ICD-10-CM | POA: Insufficient documentation

## 2014-11-29 DIAGNOSIS — K219 Gastro-esophageal reflux disease without esophagitis: Secondary | ICD-10-CM | POA: Insufficient documentation

## 2014-11-29 DIAGNOSIS — E119 Type 2 diabetes mellitus without complications: Secondary | ICD-10-CM | POA: Diagnosis present

## 2014-11-29 DIAGNOSIS — Z79899 Other long term (current) drug therapy: Secondary | ICD-10-CM | POA: Insufficient documentation

## 2014-11-29 LAB — BASIC METABOLIC PANEL WITH GFR
BUN: 9 mg/dL (ref 7–25)
CO2: 29 mmol/L (ref 20–31)
Calcium: 9.5 mg/dL (ref 8.6–10.4)
Chloride: 95 mmol/L — ABNORMAL LOW (ref 98–110)
Creat: 0.82 mg/dL (ref 0.50–1.05)
GFR, Est African American: >89 mL/min (ref >=60)
GFR, Est Non African American: 81 mL/min (ref >=60)
Glucose, Bld: 94 mg/dL (ref 65–99)
Potassium: 4 mmol/L (ref 3.5–5.3)
Sodium: 134 mmol/L — ABNORMAL LOW (ref 135–146)

## 2014-11-29 LAB — GLUCOSE, POCT (MANUAL RESULT ENTRY): POC Glucose: 80 mg/dl (ref 70–99)

## 2014-11-29 MED ORDER — BUDESONIDE-FORMOTEROL FUMARATE 80-4.5 MCG/ACT IN AERO: 2.0000 | INHALATION_SPRAY | Freq: Two times a day (BID) | RESPIRATORY_TRACT | Status: DC

## 2014-11-29 MED ORDER — ACETAMINOPHEN-CODEINE #3 300-30 MG PO TABS
1.0000 | ORAL_TABLET | ORAL | Status: DC | PRN
Start: 2014-11-29 — End: 2015-02-21

## 2014-11-29 MED ORDER — HYDROXYZINE HCL 10 MG PO TABS: 10.0000 mg | ORAL_TABLET | Freq: Three times a day (TID) | ORAL | Status: DC | PRN

## 2014-11-29 MED ORDER — ALBUTEROL SULFATE (2.5 MG/3ML) 0.083% IN NEBU: 2.5000 mg | INHALATION_SOLUTION | RESPIRATORY_TRACT | Status: DC | PRN

## 2014-11-29 NOTE — Patient Instructions (Signed)
Back Pain, Adult °Back pain is very common in adults. The cause of back pain is rarely dangerous and the pain often gets better over time. The cause of your back pain may not be known. Some common causes of back pain include: °· Strain of the muscles or ligaments supporting the spine. °· Wear and tear (degeneration) of the spinal disks. °· Arthritis. °· Direct injury to the back. °For many people, back pain may return. Since back pain is rarely dangerous, most people can learn to manage this condition on their own. °HOME CARE INSTRUCTIONS °Watch your back pain for any changes. The following actions may help to lessen any discomfort you are feeling: °· Remain active. It is stressful on your back to sit or stand in one place for long periods of time. Do not sit, drive, or stand in one place for more than 30 minutes at a time. Take short walks on even surfaces as soon as you are able. Try to increase the length of time you walk each day. °· Exercise regularly as directed by your health care provider. Exercise helps your back heal faster. It also helps avoid future injury by keeping your muscles strong and flexible. °· Do not stay in bed. Resting more than 1-2 days can delay your recovery. °· Pay attention to your body when you bend and lift. The most comfortable positions are those that put less stress on your recovering back. Always use proper lifting techniques, including: °· Bending your knees. °· Keeping the load close to your body. °· Avoiding twisting. °· Find a comfortable position to sleep. Use a firm mattress and lie on your side with your knees slightly bent. If you lie on your back, put a pillow under your knees. °· Avoid feeling anxious or stressed. Stress increases muscle tension and can worsen back pain. It is important to recognize when you are anxious or stressed and learn ways to manage it, such as with exercise. °· Take medicines only as directed by your health care provider. Over-the-counter  medicines to reduce pain and inflammation are often the most helpful. Your health care provider may prescribe muscle relaxant drugs. These medicines help dull your pain so you can more quickly return to your normal activities and healthy exercise. °· Apply ice to the injured area: °· Put ice in a plastic bag. °· Place a towel between your skin and the bag. °· Leave the ice on for 20 minutes, 2-3 times a day for the first 2-3 days. After that, ice and heat may be alternated to reduce pain and spasms. °· Maintain a healthy weight. Excess weight puts extra stress on your back and makes it difficult to maintain good posture. °SEEK MEDICAL CARE IF: °· You have pain that is not relieved with rest or medicine. °· You have increasing pain going down into the legs or buttocks. °· You have pain that does not improve in one week. °· You have night pain. °· You lose weight. °· You have a fever or chills. °SEEK IMMEDIATE MEDICAL CARE IF:  °· You develop new bowel or bladder control problems. °· You have unusual weakness or numbness in your arms or legs. °· You develop nausea or vomiting. °· You develop abdominal pain. °· You feel faint. °  °This information is not intended to replace advice given to you by your health care provider. Make sure you discuss any questions you have with your health care provider. °  °Document Released: 01/19/2005 Document Revised: 02/09/2014 Document Reviewed: 05/23/2013 °Elsevier Interactive Patient Education ©2016 [employer] Fall Prevention in the Home  Falls can cause injuries and can affect people from all age groups. There are many simple things that you can do to make your home safe and to help prevent falls. WHAT CAN I DO ON THE OUTSIDE OF MY HOME?  Regularly repair the edges of walkways and driveways and fix any cracks.  Remove high doorway thresholds.  Trim any shrubbery on the main path into your home.  Use bright outdoor lighting.  Clear walkways of debris and clutter,  including tools and rocks.  Regularly check that handrails are securely fastened and in good repair. Both sides of any steps should have handrails.  Install guardrails along the edges of any raised decks or porches.  Have leaves, snow, and ice cleared regularly.  Use sand or salt on walkways during winter months.  In the garage, clean up any spills right away, including grease or oil spills. WHAT CAN I DO IN THE BATHROOM?  Use night lights.  Install grab bars by the toilet and in the tub and shower. Do not use towel bars as grab bars.  Use non-skid mats or decals on the floor of the tub or shower.  If you need to sit down while you are in the shower, use a plastic, non-slip stool.Marland Kitchen  Keep the floor dry. Immediately clean up any water that spills on the floor.  Remove soap buildup in the tub or shower on a regular basis.  Attach bath mats securely with double-sided non-slip rug tape.  Remove throw rugs and other tripping hazards from the floor. WHAT CAN I DO IN THE BEDROOM?  Use night lights.  Make sure that a bedside light is easy to reach.  Do not use oversized bedding that drapes onto the floor.  Have a firm chair that has side arms to use for getting dressed.  Remove throw rugs and other tripping hazards from the floor. WHAT CAN I DO IN THE KITCHEN?   Clean up any spills right away.  Avoid walking on wet floors.  Place frequently used items in easy-to-reach places.  If you need to reach for something above you, use a sturdy step stool that has a grab bar.  Keep electrical cables out of the way.  Do not use floor polish or wax that makes floors slippery. If you have to use wax, make sure that it is non-skid floor wax.  Remove throw rugs and other tripping hazards from the floor. WHAT CAN I DO IN THE STAIRWAYS?  Do not leave any items on the stairs.  Make sure that there are handrails on both sides of the stairs. Fix handrails that are broken or loose. Make  sure that handrails are as long as the stairways.  Check any carpeting to make sure that it is firmly attached to the stairs. Fix any carpet that is loose or worn.  Avoid having throw rugs at the top or bottom of stairways, or secure the rugs with carpet tape to prevent them from moving.  Make sure that you have a light switch at the top of the stairs and the bottom of the stairs. If you do not have them, have them installed. WHAT ARE SOME OTHER FALL PREVENTION TIPS?  Wear closed-toe shoes that fit well and support your feet. Wear shoes that have rubber soles or low heels.  When you use a stepladder, make sure that it is completely opened and that the sides are firmly locked. Have someone hold the ladder  while you are using it. Do not climb a closed stepladder.  Add color or contrast paint or tape to grab bars and handrails in your home. Place contrasting color strips on the first and last steps.  Use mobility aids as needed, such as canes, walkers, scooters, and crutches.  Turn on lights if it is dark. Replace any light bulbs that burn out.  Set up furniture so that there are clear paths. Keep the furniture in the same spot.  Fix any uneven floor surfaces.  Choose a carpet design that does not hide the edge of steps of a stairway.  Be aware of any and all pets.  Review your medicines with your healthcare provider. Some medicines can cause dizziness or changes in blood pressure, which increase your risk of falling. Talk with your health care provider about other ways that you can decrease your risk of falls. This may include working with a physical therapist or trainer to improve your strength, balance, and endurance.   This information is not intended to replace advice given to you by your health care provider. Make sure you discuss any questions you have with your health care provider.   Document Released: 01/09/2002 Document Revised: 06/05/2014 Document Reviewed:  02/23/2014 Elsevier Interactive Patient Education [employer]

## 2014-11-29 NOTE — Progress Notes (Signed)
Patient ID: Stacy [name], female   DOB: 12-11-1960, 54 y.o.   MRN: [id number]   Stacy [name], is a 54 y.o. female  WUJ:[id number]  [id number]  DOB - 11-15-1960  CC: No chief complaint on file.     Stacy [name] is a 54 y.o. female with a    HPI: Stacy [name] is a 54 y.o. female here today for follow up from ER. Patient has his history of type 2 diabetes mellitus (A1c 6.4), COPD, GERD, hypertension, chronic back pain. And chronic knee pain. Patient was seen in ER on 10/19 for diarrhea and abdominal pain was diagnosed with gastritis and hypokalemia and given IV fluids, zofran and potassium. Patient states symptoms have mostly resolved and is here today for continued chronic back pain and leg spasms causing multiple falls. Patient ambulates with rollator. Patient had MRI  10/02/2014 with findings significant for "Stable lumbar spine since 2015 aside from increased degenerative appearing endplate marrow edema on the right at L5. Advanced chronic disc and endplate degeneration at L4-L5. Chronic multifactorial moderate spinal stenosis at L3-L4, mild right lateral recess stenosis at L5-S1, and mild spinal stenosis at L2-L3 and L4-L5. Mild foraminal stenosis also at those levels.  She has been seen by orthopedics and has been unsuccessful with physical therapy. She has not used any OTC remedies and complains of weakness and fatigue. Patient is requesting a letter to her housing - to be relocated to a single story unit. Patient is still smoking 1/2 to 1 pack of cigarettes a day, states she would like to quit but can not afford medication (nicotine patch) . Patient states she uses inhalers and medications as prescribed. Patient has No headache, No chest pain, No abdominal pain - No Nausea, No Cough - SOB.   Allergies  Allergen Reactions  . Other Shortness Of Breath    Allergic to perfumes and cleaning products  . Aspirin Nausea Only    unknown  . Ace Inhibitors Other (See Comments)    Cough,  wheeze   Past Medical History  Diagnosis Date  . Hypertension     Pt on lisinopril  . GERD (gastroesophageal reflux disease)     Pt on Protonix daily  . Arthritis     Takes Naprosyn   . Diabetes mellitus     Takes Metformin 750 mg  . Hyperlipidemia   . Asthma   . COPD (chronic obstructive pulmonary disease) (HCC)   . Shortness of breath     occasional - uses breathing tx at home  . Headache(784.0)     otc meds prn  . Irritable bowel syndrome 11/19/2010   Current Outpatient Prescriptions on File Prior to Visit  Medication Sig Dispense Refill  . albuterol (PROVENTIL) (2.5 MG/3ML) 0.083% nebulizer solution Take 3 mLs (2.5 mg total) by nebulization every 4 (four) hours as needed for wheezing. 75 mL 3  . allopurinol (ZYLOPRIM) 100 MG tablet Take 1 tablet (100 mg total) by mouth daily. 90 tablet 3  . Blood Glucose Monitoring Suppl (TRUE METRIX METER) DEVI 1 Device by Does not apply route 4 (four) times daily -  before meals and at bedtime. 1 Device 0  . budesonide-formoterol (SYMBICORT) 80-4.5 MCG/ACT inhaler Inhale 2 puffs into the lungs 2 (two) times daily. 3 Inhaler 3  . colchicine 0.6 MG tablet Take 1 tablet (0.6 mg total) by mouth daily. 90 tablet 3  . cyclobenzaprine (FLEXERIL) 5 MG tablet Take 1 tablet (5 mg total) by mouth 3 (three) times daily as needed for  muscle spasms. 30 tablet 3  . EPINEPHrine 0.3 mg/0.3 mL IJ SOAJ injection Inject 0.3 mLs (0.3 mg total) into the muscle once. 1 Device 1  . gabapentin (NEURONTIN) 300 MG capsule Take 1 capsule (300 mg total) by mouth 3 (three) times daily. 270 capsule 3  . glucose blood (TRUE METRIX BLOOD GLUCOSE TEST) test strip USE AS DIRECTED BY PHYSICIAN 100 each 12  . glucose blood (TRUE METRIX BLOOD GLUCOSE TEST) test strip Use as instructed before meals and at bedtime 100 each 12  . hydrocortisone-pramoxine (ANALPRAM-HC) 2.5-1 % rectal cream Place 1 application rectally 2 (two) times daily. 30 g 0  . hydrOXYzine (ATARAX/VISTARIL) 10 MG  tablet Take 1 tablet (10 mg total) by mouth 3 (three) times daily as needed. (Patient taking differently: Take 10 mg by mouth 3 (three) times daily as needed for itching. ) 30 tablet 3  . metFORMIN (GLUCOPHAGE-XR) 750 MG 24 hr tablet Take 1 tablet (750 mg total) by mouth daily with breakfast. 90 tablet 3  . metoCLOPramide (REGLAN) 5 MG tablet Take 1 tablet (5 mg total) by mouth every 8 (eight) hours as needed for nausea or vomiting. 90 tablet 0  . pantoprazole (PROTONIX) 40 MG tablet Take 1 tablet (40 mg total) by mouth daily. 90 tablet 3  . simvastatin (ZOCOR) 20 MG tablet Take 1 tablet (20 mg total) by mouth at bedtime. 90 tablet 3  . valsartan-hydrochlorothiazide (DIOVAN-HCT) 160-12.5 MG per tablet Take 1 tablet by mouth daily. 90 tablet 3  . Vitamin D, Ergocalciferol, (DRISDOL) 50000 UNITS CAPS capsule Take 1 capsule (50,000 Units total) by mouth every 7 (seven) weeks. Sundays 12 capsule 0  . acetaminophen-codeine (TYLENOL #3) 300-30 MG per tablet Take 1 tablet by mouth every 4 (four) hours as needed. (Patient not taking: Reported on 11/21/2014) 90 tablet 0  . diazepam (VALIUM) 5 MG tablet Take 1 tablet (5 mg total) by mouth every 12 (twelve) hours as needed for muscle spasms. 5 tablet 0  . HYDROcodone-acetaminophen (NORCO) 7.5-325 MG tablet Take 1-2 tablets by mouth every 4 (four) hours as needed. pain  0  . HYDROcodone-acetaminophen (NORCO/VICODIN) 5-325 MG per tablet Take 2 tablets by mouth every 4 (four) hours as needed. (Patient not taking: Reported on 11/21/2014) 15 tablet 0  . nicotine (NICODERM CQ - DOSED IN MG/24 HOURS) 21 mg/24hr patch Place 1 patch (21 mg total) onto the skin daily. (Patient not taking: Reported on 11/29/2014) 28 patch 0  . ondansetron (ZOFRAN ODT) 4 MG disintegrating tablet Take 1 tablet (4 mg total) by mouth every 8 (eight) hours as needed for nausea or vomiting. 20 tablet 0  . [DISCONTINUED] Fluticasone-Salmeterol (ADVAIR) 500-50 MCG/DOSE AEPB Inhale 1 puff into the  lungs every 12 (twelve) hours.      . [DISCONTINUED] hydrochlorothiazide (,MICROZIDE/HYDRODIURIL,) 12.5 MG capsule Take 12.5 mg by mouth daily.      . [DISCONTINUED] lisinopril (PRINIVIL,ZESTRIL) 40 MG tablet Take 40 mg by mouth daily.       No current facility-administered medications on file prior to visit.   Family History  Problem Relation Age of Onset  . Asthma Son     had as a child  . Heart disease Sister   . Breast cancer Sister   . Hypertension Father   . Cancer Father   . Hypertension Brother    Social History   Social History  . Marital Status: Married    Spouse Name: N/A  . Number of Children: 2  . Years of Education:  N/A   Occupational History  . unemployed    Social History Main Topics  . Smoking status: Current Every Day Smoker -- 1.00 packs/day for 10 years    Types: Cigarettes  . Smokeless tobacco: Never Used  . Alcohol Use: No  . Drug Use: No  . Sexual Activity: Yes    Birth Control/ Protection: Surgical   Other Topics Concern  . Not on file   Social History Narrative    Review of Systems  Constitutional: Positive for weight loss and malaise/fatigue. Negative for fever, chills and diaphoresis.  HENT: Negative.   Eyes: Negative.   Respiratory: Positive for shortness of breath and wheezing.   Cardiovascular: Negative.   Gastrointestinal: Positive for diarrhea.       Slowly resolving  Genitourinary: Negative.   Musculoskeletal: Positive for back pain, joint pain and falls.       Bilateral knees, multiple falls every day  Skin: Negative.   Neurological: Positive for weakness.  Endo/Heme/Allergies: Negative.   Psychiatric/Behavioral: Negative.       Objective:   Filed Vitals:   11/29/14 0912  BP: 108/73  Pulse: 78  Temp: 98 F (36.7 C)  Resp: 18    Physical Exam  Constitutional: She is oriented to person, place, and time. She appears well-developed. She appears distressed.  HENT:  Head: Normocephalic and atraumatic.  Right Ear:  External ear normal.  Left Ear: External ear normal.  Nose: Nose normal.  Mouth/Throat: Oropharynx is clear and moist.  Eyes: Conjunctivae and EOM are normal. Pupils are equal, round, and reactive to light.  Neck: Normal range of motion. Neck supple.  Cardiovascular: Normal rate, regular rhythm, normal heart sounds and intact distal pulses.   Pulmonary/Chest: Effort normal and breath sounds normal.  Abdominal: Soft. Bowel sounds are normal.  Genitourinary:  deferred  Musculoskeletal:       Thoracic back: She exhibits decreased range of motion, tenderness, pain and spasm.       Lumbar back: She exhibits decreased range of motion, tenderness, pain and spasm.  Neurological: She is alert and oriented to person, place, and time. She has normal reflexes.  Skin: Skin is warm and dry.  Nursing note and vitals reviewed.    Lab Results  Component Value Date   WBC 5.6 11/21/2014   HGB 13.8 11/21/2014   HCT 40.0 11/21/2014   MCV 81.5 11/21/2014   PLT 267 11/21/2014   Lab Results  Component Value Date   CREATININE 0.90 11/21/2014   BUN 10 11/21/2014   NA 133* 11/21/2014   K 3.1* 11/21/2014   CL 95* 11/21/2014   CO2 27 11/21/2014    Lab Results  Component Value Date   HGBA1C 6.40 10/11/2014   Lipid Panel     Component Value Date/Time   CHOL 194 07/19/2014 1202   TRIG 118 07/19/2014 1202   HDL 46 07/19/2014 1202   CHOLHDL 4.2 07/19/2014 1202   VLDL 24 07/19/2014 1202   LDLCALC 124* 07/19/2014 1202       Assessment and plan:   Diagnoses and all orders for this visit:  Type 2 diabetes mellitus without complication, without long-term current use of insulin (HCC) -     Glucose (CBG) -     BASIC METABOLIC PANEL WITH GFR  Asthma, chronic, mild persistent, uncomplicated -     albuterol (PROVENTIL) (2.5 MG/3ML) 0.083% nebulizer solution; Take 3 mLs (2.5 mg total) by nebulization every 4 (four) hours as needed for wheezing. -  budesonide-formoterol (SYMBICORT) 80-4.5  MCG/ACT inhaler; Inhale 2 puffs into the lungs 2 (two) times daily. -     hydrOXYzine (ATARAX/VISTARIL) 10 MG tablet; Take 1 tablet (10 mg total) by mouth 3 (three) times daily as needed.  Bilateral low back pain without sciatica -     acetaminophen-codeine (TYLENOL #3) 300-30 MG tablet; Take 1 tablet by mouth every 4 (four) hours as needed. -     Ambulatory referral to Neurosurgery  Falls frequently -     Vit D  25 hydroxy (rtn osteoporosis monitoring) -     Ambulatory referral to Neurosurgery  Colon cancer screening -     Ambulatory referral to Gastroenterology   The patient was given clear instructions to go to ER or return to medical center if symptoms don't improve, worsen or new problems develop. The patient verbalized understanding. The patient was told to call to get lab results if they haven't heard anything in the next week.     Stephanie Coup, RN BSN AGNP-APP Student [facility] [phone] 11/29/2014, 10:08 AM  Evaluation and management procedures were performed by the Advanced Practitioner Student under my supervision and collaboration. I have reviewed the Advanced Practitioner's note and chart, and I agree with the management and plan.   Jeanann Lewandowsky, MD, MHA, CPE, FACP, FAAP [facility] [place], [place] [phone]   11/29/2014, 6:14 PM

## 2014-11-29 NOTE — Progress Notes (Signed)
Patient here for frequent falls. Patient complains of diarrhea for 16 days but did not have money for suppository.  Patient needs an accomodation to move from her home which has steps.  Patient complains of pain scaled at a 9 in her back and bilateral knees.  Patient has not eaten, Patient has only taken zofran.

## 2014-11-30 LAB — VITAMIN D 25 HYDROXY (VIT D DEFICIENCY, FRACTURES): Vit D, 25-Hydroxy: 29 ng/mL — ABNORMAL LOW (ref 30–100)

## 2014-12-05 ENCOUNTER — Telehealth: Payer: Self-pay | Admitting: Internal Medicine

## 2014-12-05 NOTE — Telephone Encounter (Signed)
Patient picked up application for handicap placard application

## 2014-12-07 NOTE — Telephone Encounter (Signed)
-----   Message from Quentin Angstlugbemiga E Jegede, MD sent at 12/05/2014  5:48 PM EDT ----- Please inform patient about her laboratory results are mostly normal. Vitamin D level has improved significantly. Continue current medications.

## 2014-12-07 NOTE — Telephone Encounter (Signed)
Patient verified DOB Patient informed of her laboratory results being mostly normal.  Patient also informed of her Vitamin D level improving significantly.  Patient advised to continue current medications. Patient had no questions and expressed her understanding.

## 2014-12-24 ENCOUNTER — Telehealth: Payer: Self-pay | Admitting: Internal Medicine

## 2014-12-24 NOTE — Telephone Encounter (Signed)
Patient called to let nurse know that she has a new pharmacy:    [facility]  Ph: [phone] Fax:[phone] [address] [place] [address]  Please f/u with pt. If necessary.

## 2014-12-25 ENCOUNTER — Telehealth: Payer: Self-pay | Admitting: Internal Medicine

## 2014-12-25 NOTE — Telephone Encounter (Signed)
Patient is calling to provide us with her new pharmacy information.  [facility] [address] [place], [place] [address] [phone]  Please fax all prescriptions to this pharmacy from now on at [phone]  Thank you.

## 2014-12-25 NOTE — Telephone Encounter (Signed)
Medical Assistant changed patients pharmacy in the system.

## 2015-01-04 ENCOUNTER — Encounter: Payer: Self-pay | Admitting: Family Medicine

## 2015-01-04 ENCOUNTER — Ambulatory Visit (INDEPENDENT_AMBULATORY_CARE_PROVIDER_SITE_OTHER): Payer: Medicaid Other | Admitting: Family Medicine

## 2015-01-04 VITALS — BP 144/88 | Ht 66.0 in | Wt 166.0 lb

## 2015-01-04 DIAGNOSIS — M1710 Unilateral primary osteoarthritis, unspecified knee: Secondary | ICD-10-CM | POA: Insufficient documentation

## 2015-01-04 DIAGNOSIS — M179 Osteoarthritis of knee, unspecified: Secondary | ICD-10-CM | POA: Insufficient documentation

## 2015-01-04 DIAGNOSIS — M170 Bilateral primary osteoarthritis of knee: Secondary | ICD-10-CM

## 2015-01-04 DIAGNOSIS — M25562 Pain in left knee: Secondary | ICD-10-CM

## 2015-01-04 DIAGNOSIS — M25561 Pain in right knee: Secondary | ICD-10-CM | POA: Diagnosis not present

## 2015-01-04 MED ORDER — METHYLPREDNISOLONE ACETATE 80 MG/ML IJ SUSP
80.0000 mg | Freq: Once | INTRAMUSCULAR | Status: AC
  Administered 2015-01-04: 80 mg via INTRA_ARTICULAR

## 2015-01-04 MED ORDER — METHYLPREDNISOLONE ACETATE 40 MG/ML IJ SUSP
40.0000 mg | Freq: Once | INTRAMUSCULAR | Status: AC
  Administered 2015-01-04: 40 mg via INTRA_ARTICULAR

## 2015-01-04 NOTE — Assessment & Plan Note (Signed)
We discussed her situation today. She continues to use walker and right knee immobilizer.  She has left knee brace as well.  Fortunately she has been told she will likely get Medicaid coverage in the next months.  She is also had difficulty making the $30 co-pay for her orthopedic appointment and that would drop to $3.  She is hopeful to get knee replacement of the right knee in the next year.  For some immediate pain relief today we decided to try bilateral corticosteroid injections.  I'll see her back when necessary.  I did encourage her to go ahead and make the appointment with orthopedics.

## 2015-01-04 NOTE — Progress Notes (Signed)
   Subjective:    Patient ID: Stacy [name], female    DOB: 08-28-1960, 54 y.o.   MRN: [id number]  HPI Bilateral knee pain right greater than left.  In the last 3 weeks it has become almost unbearable.  She is currently using a walker, continues to keep the right knee in a knee immobilizer and uses a neoprene brace on the left knee.  She is using heat and ice as well as over-the-counter pain medication.  Pain is 5-6 out of 10 all the time and 8-9 out of 10 in the right knee about 50% of the time.  She is essentially not doing any more walking than is required for her ADLs.  She is not working.  She is not driving.   Would like to consider her to steroid injection today for pain relief.   Review of Systems No unusual weight change, fever, sweats, chills.  No calf pain.  Does have some intermittent swelling bilateral right greater than left knees.  He has noted no erythema of the knees.    Objective:   Physical Exam  Vital signs are reviewed GENERAL: Well-developed female in acute distress KNEES: Bilaterally she has some external changes consistent with synovitis.  Full extension and flexion bilaterally.  Last 10-20 of extension on the right are extremely painful.  Ligamentously intact varus and valgus stress as well as anterior drawer.  Popliteal space is benign.  The calf is soft.   NEURO/VASC: Distal she has intact neurovascular status. SKIN: No erythema, no lesions around either knee joint.  INJECTION: Patient was given informed consent, signed copy in the chart. Appropriate time out was taken. Area prepped and draped in usual sterile fashion. 1 cc of methylprednisolone 40 mg/ml plus  4 cc of 1% lidocaine without epinephrine was injected into the Left knee using a(n) Anterior medial approach. The patient tolerated the procedure well. There were no complications. Post procedure instructions were given. INJECTION: Patient was given informed consent, signed copy in the chart. Appropriate time  out was taken. Area prepped and draped in usual sterile fashion. 1 cc of methylprednisolone 80 mg/ml plus  2 cc of 1% lidocaine without epinephrine and 2 cc of Marcaine was injected into the Right knee using a(n) Anterior medial approach. The patient tolerated the procedure well. There were no complications. Post procedure instructions were given.     Assessment & Plan:

## 2015-02-08 MED FILL — HYDROCODON-APAP 7.5-325: 7.5-325 | 5 days supply | Qty: 60 | Fill #0

## 2015-02-11 ENCOUNTER — Encounter: Payer: Self-pay | Admitting: Internal Medicine

## 2015-02-21 ENCOUNTER — Ambulatory Visit: Payer: Medicaid Other | Attending: Internal Medicine | Admitting: Internal Medicine

## 2015-02-21 ENCOUNTER — Encounter: Payer: Self-pay | Admitting: Internal Medicine

## 2015-02-21 VITALS — BP 156/84 | HR 63 | Temp 98.5°F | Resp 18 | Ht 66.0 in | Wt 166.6 lb

## 2015-02-21 DIAGNOSIS — M1000 Idiopathic gout, unspecified site: Secondary | ICD-10-CM | POA: Diagnosis not present

## 2015-02-21 DIAGNOSIS — M109 Gout, unspecified: Secondary | ICD-10-CM

## 2015-02-21 DIAGNOSIS — J449 Chronic obstructive pulmonary disease, unspecified: Secondary | ICD-10-CM | POA: Insufficient documentation

## 2015-02-21 DIAGNOSIS — Z79899 Other long term (current) drug therapy: Secondary | ICD-10-CM | POA: Insufficient documentation

## 2015-02-21 DIAGNOSIS — K589 Irritable bowel syndrome without diarrhea: Secondary | ICD-10-CM | POA: Insufficient documentation

## 2015-02-21 DIAGNOSIS — J45909 Unspecified asthma, uncomplicated: Secondary | ICD-10-CM | POA: Diagnosis not present

## 2015-02-21 DIAGNOSIS — M545 Low back pain, unspecified: Secondary | ICD-10-CM

## 2015-02-21 DIAGNOSIS — Z888 Allergy status to other drugs, medicaments and biological substances status: Secondary | ICD-10-CM | POA: Diagnosis not present

## 2015-02-21 DIAGNOSIS — M179 Osteoarthritis of knee, unspecified: Secondary | ICD-10-CM | POA: Insufficient documentation

## 2015-02-21 DIAGNOSIS — J4530 Mild persistent asthma, uncomplicated: Secondary | ICD-10-CM | POA: Diagnosis not present

## 2015-02-21 DIAGNOSIS — E785 Hyperlipidemia, unspecified: Secondary | ICD-10-CM | POA: Diagnosis not present

## 2015-02-21 DIAGNOSIS — E119 Type 2 diabetes mellitus without complications: Secondary | ICD-10-CM | POA: Insufficient documentation

## 2015-02-21 DIAGNOSIS — Z7984 Long term (current) use of oral hypoglycemic drugs: Secondary | ICD-10-CM | POA: Diagnosis not present

## 2015-02-21 DIAGNOSIS — K219 Gastro-esophageal reflux disease without esophagitis: Secondary | ICD-10-CM | POA: Insufficient documentation

## 2015-02-21 DIAGNOSIS — M1990 Unspecified osteoarthritis, unspecified site: Secondary | ICD-10-CM | POA: Insufficient documentation

## 2015-02-21 DIAGNOSIS — I10 Essential (primary) hypertension: Secondary | ICD-10-CM | POA: Diagnosis present

## 2015-02-21 LAB — GLUCOSE, POCT (MANUAL RESULT ENTRY): POC Glucose: 93 mg/dl (ref 70–99)

## 2015-02-21 LAB — POCT GLYCOSYLATED HEMOGLOBIN (HGB A1C): Hemoglobin A1C: 6.3

## 2015-02-21 MED ORDER — METFORMIN HCL ER 750 MG PO TB24
750.0000 mg | ORAL_TABLET | Freq: Every day | ORAL | Status: DC
Start: 2015-02-21 — End: 2015-10-24

## 2015-02-21 MED ORDER — ACETAMINOPHEN-CODEINE #3 300-30 MG PO TABS: 1.0000 | ORAL_TABLET | ORAL | Status: DC | PRN

## 2015-02-21 MED ORDER — ALBUTEROL SULFATE (2.5 MG/3ML) 0.083% IN NEBU: 2.5000 mg | INHALATION_SOLUTION | RESPIRATORY_TRACT | Status: DC | PRN

## 2015-02-21 MED ORDER — SIMVASTATIN 20 MG PO TABS: 20.0000 mg | ORAL_TABLET | Freq: Every day | ORAL | Status: DC

## 2015-02-21 MED ORDER — VALSARTAN-HYDROCHLOROTHIAZIDE 160-12.5 MG PO TABS: 1.0000 | ORAL_TABLET | Freq: Every day | ORAL | Status: DC

## 2015-02-21 MED ORDER — CYCLOBENZAPRINE HCL 5 MG PO TABS: 5.0000 mg | ORAL_TABLET | Freq: Three times a day (TID) | ORAL | Status: DC | PRN

## 2015-02-21 MED ORDER — BUDESONIDE-FORMOTEROL FUMARATE 80-4.5 MCG/ACT IN AERO: 2.0000 | INHALATION_SPRAY | Freq: Two times a day (BID) | RESPIRATORY_TRACT | Status: DC

## 2015-02-21 MED ORDER — HYDROXYZINE HCL 10 MG PO TABS: 10.0000 mg | ORAL_TABLET | Freq: Three times a day (TID) | ORAL | Status: DC | PRN

## 2015-02-21 MED ORDER — GABAPENTIN 300 MG PO CAPS: 300.0000 mg | ORAL_CAPSULE | Freq: Three times a day (TID) | ORAL | Status: DC

## 2015-02-21 MED ORDER — COLCHICINE 0.6 MG PO TABS: 0.6000 mg | ORAL_TABLET | Freq: Every day | ORAL | Status: DC

## 2015-02-21 MED ORDER — ALLOPURINOL 100 MG PO TABS: 100.0000 mg | ORAL_TABLET | Freq: Every day | ORAL | Status: DC

## 2015-02-21 MED ORDER — PANTOPRAZOLE SODIUM 40 MG PO TBEC: 40.0000 mg | DELAYED_RELEASE_TABLET | Freq: Every day | ORAL | Status: DC

## 2015-02-21 NOTE — Progress Notes (Signed)
Patient here for FU DM + HTN  Patient has been out of medications for almost 2 months. Patient is waiting for SSI to pay for medications.  Patient complains of pain in her lower back scaled currently at a 9 described as sharp pain.

## 2015-02-21 NOTE — Patient Instructions (Signed)
DASH Eating Plan DASH stands for "Dietary Approaches to Stop Hypertension." The DASH eating plan is a healthy eating plan that has been shown to reduce high blood pressure (hypertension). Additional health benefits may include reducing the risk of type 2 diabetes mellitus, heart disease, and stroke. The DASH eating plan may also help with weight loss. WHAT DO I NEED TO KNOW ABOUT THE DASH EATING PLAN? For the DASH eating plan, you will follow these general guidelines:  Choose foods with a percent daily value for sodium of less than 5% (as listed on the food label).  Use salt-free seasonings or herbs instead of table salt or sea salt.  Check with your health care provider or pharmacist before using salt substitutes.  Eat lower-sodium products, often labeled as "lower sodium" or "no salt added."  Eat fresh foods.  Eat more vegetables, fruits, and low-fat dairy products.  Choose whole grains. Look for the word "whole" as the first word in the ingredient list.  Choose fish and skinless chicken or turkey more often than red meat. Limit fish, poultry, and meat to 6 oz (170 g) each day.  Limit sweets, desserts, sugars, and sugary drinks.  Choose heart-healthy fats.  Limit cheese to 1 oz (28 g) per day.  Eat more home-cooked food and less restaurant, buffet, and fast food.  Limit fried foods.  Cook foods using methods other than frying.  Limit canned vegetables. If you do use them, rinse them well to decrease the sodium.  When eating at a restaurant, ask that your food be prepared with less salt, or no salt if possible. WHAT FOODS CAN I EAT? Seek help from a dietitian for individual calorie needs. Grains Whole grain or whole wheat bread. Brown rice. Whole grain or whole wheat pasta. Quinoa, bulgur, and whole grain cereals. Low-sodium cereals. Corn or whole wheat flour tortillas. Whole grain cornbread. Whole grain crackers. Low-sodium crackers. Vegetables Fresh or frozen vegetables  (raw, steamed, roasted, or grilled). Low-sodium or reduced-sodium tomato and vegetable juices. Low-sodium or reduced-sodium tomato sauce and paste. Low-sodium or reduced-sodium canned vegetables.  Fruits All fresh, canned (in natural juice), or frozen fruits. Meat and Other Protein Products Ground beef (85% or leaner), grass-fed beef, or beef trimmed of fat. Skinless chicken or turkey. Ground chicken or turkey. Pork trimmed of fat. All fish and seafood. Eggs. Dried beans, peas, or lentils. Unsalted nuts and seeds. Unsalted canned beans. Dairy Low-fat dairy products, such as skim or 1% milk, 2% or reduced-fat cheeses, low-fat ricotta or cottage cheese, or plain low-fat yogurt. Low-sodium or reduced-sodium cheeses. Fats and Oils Tub margarines without trans fats. Light or reduced-fat mayonnaise and salad dressings (reduced sodium). Avocado. Safflower, olive, or canola oils. Natural peanut or almond butter. Other Unsalted popcorn and pretzels. The items listed above may not be a complete list of recommended foods or beverages. Contact your dietitian for more options. WHAT FOODS ARE NOT RECOMMENDED? Grains White bread. White pasta. White rice. Refined cornbread. Bagels and croissants. Crackers that contain trans fat. Vegetables Creamed or fried vegetables. Vegetables in a cheese sauce. Regular canned vegetables. Regular canned tomato sauce and paste. Regular tomato and vegetable juices. Fruits Dried fruits. Canned fruit in light or heavy syrup. Fruit juice. Meat and Other Protein Products Fatty cuts of meat. Ribs, chicken wings, bacon, sausage, bologna, salami, chitterlings, fatback, hot dogs, bratwurst, and packaged luncheon meats. Salted nuts and seeds. Canned beans with salt. Dairy Whole or 2% milk, cream, half-and-half, and cream cheese. Whole-fat or sweetened yogurt. Full-fat   cheeses or blue cheese. Nondairy creamers and whipped toppings. Processed cheese, cheese spreads, or cheese  curds. Condiments Onion and garlic salt, seasoned salt, table salt, and sea salt. Canned and packaged gravies. Worcestershire sauce. Tartar sauce. Barbecue sauce. Teriyaki sauce. Soy sauce, including reduced sodium. Steak sauce. Fish sauce. Oyster sauce. Cocktail sauce. Horseradish. Ketchup and mustard. Meat flavorings and tenderizers. Bouillon cubes. Hot sauce. Tabasco sauce. Marinades. Taco seasonings. Relishes. Fats and Oils Butter, stick margarine, lard, shortening, ghee, and bacon fat. Coconut, palm kernel, or palm oils. Regular salad dressings. Other Pickles and olives. Salted popcorn and pretzels. The items listed above may not be a complete list of foods and beverages to avoid. Contact your dietitian for more information. WHERE CAN I FIND MORE INFORMATION? [employer], [employer]: www.nhlbi.nih.gov/health/health-topics/topics/dash/   This information is not intended to replace advice given to you by your health care provider. Make sure you discuss any questions you have with your health care provider.   Document Released: 01/08/2011 Document Revised: 02/09/2014 Document Reviewed: 11/23/2012 Elsevier Interactive Patient Education 2016 [employer] Hypertension Hypertension, commonly called high blood pressure, is when the force of blood pumping through your arteries is too strong. Your arteries are the blood vessels that carry blood from your heart throughout your body. A blood pressure reading consists of a higher number over a lower number, such as 110/72. The higher number (systolic) is the pressure inside your arteries when your heart pumps. The lower number (diastolic) is the pressure inside your arteries when your heart relaxes. Ideally you want your blood pressure below 120/80. Hypertension forces your heart to work harder to pump blood. Your arteries may become narrow or stiff. Having untreated or uncontrolled hypertension can cause heart attack, stroke, kidney  disease, and other problems. RISK FACTORS Some risk factors for high blood pressure are controllable. Others are not.  Risk factors you cannot control include:   Race. You may be at higher risk if you are African American.  Age. Risk increases with age.  Gender. Men are at higher risk than women before age 45 years. After age 65, women are at higher risk than men. Risk factors you can control include:  Not getting enough exercise or physical activity.  Being overweight.  Getting too much fat, sugar, calories, or salt in your diet.  Drinking too much alcohol. SIGNS AND SYMPTOMS Hypertension does not usually cause signs or symptoms. Extremely high blood pressure (hypertensive crisis) may cause headache, anxiety, shortness of breath, and nosebleed. DIAGNOSIS To check if you have hypertension, your health care provider will measure your blood pressure while you are seated, with your arm held at the level of your heart. It should be measured at least twice using the same arm. Certain conditions can cause a difference in blood pressure between your right and left arms. A blood pressure reading that is higher than normal on one occasion does not mean that you need treatment. If it is not clear whether you have high blood pressure, you may be asked to return on a different day to have your blood pressure checked again. Or, you may be asked to monitor your blood pressure at home for 1 or more weeks. TREATMENT Treating high blood pressure includes making lifestyle changes and possibly taking medicine. Living a healthy lifestyle can help lower high blood pressure. You may need to change some of your habits. Lifestyle changes may include:  Following the DASH diet. This diet is high in fruits, vegetables, and whole grains.   It is low in salt, red meat, and added sugars.  Keep your sodium intake below 2,300 mg per day.  Getting at least 30-45 minutes of aerobic exercise at least 4 times per  week.  Losing weight if necessary.  Not smoking.  Limiting alcoholic beverages.  Learning ways to reduce stress. Your health care provider may prescribe medicine if lifestyle changes are not enough to get your blood pressure under control, and if one of the following is true:  You are 18-59 years of age and your systolic blood pressure is above 140.  You are 60 years of age or older, and your systolic blood pressure is above 150.  Your diastolic blood pressure is above 90.  You have diabetes, and your systolic blood pressure is over 140 or your diastolic blood pressure is over 90.  You have kidney disease and your blood pressure is above 140/90.  You have heart disease and your blood pressure is above 140/90. Your personal target blood pressure may vary depending on your medical conditions, your age, and other factors. HOME CARE INSTRUCTIONS  Have your blood pressure rechecked as directed by your health care provider.   Take medicines only as directed by your health care provider. Follow the directions carefully. Blood pressure medicines must be taken as prescribed. The medicine does not work as well when you skip doses. Skipping doses also puts you at risk for problems.  Do not smoke.   Monitor your blood pressure at home as directed by your health care provider. SEEK MEDICAL CARE IF:   You think you are having a reaction to medicines taken.  You have recurrent headaches or feel dizzy.  You have swelling in your ankles.  You have trouble with your vision. SEEK IMMEDIATE MEDICAL CARE IF:  You develop a severe headache or confusion.  You have unusual weakness, numbness, or feel faint.  You have severe chest or abdominal pain.  You vomit repeatedly.  You have trouble breathing. MAKE SURE YOU:   Understand these instructions.  Will watch your condition.  Will get help right away if you are not doing well or get worse.   This information is not intended to  replace advice given to you by your health care provider. Make sure you discuss any questions you have with your health care provider.   Document Released: 01/19/2005 Document Revised: 06/05/2014 Document Reviewed: 11/11/2012 Elsevier Interactive Patient Education 2016 [employer] Diabetes and Exercise Exercising regularly is important. It is not just about losing weight. It has many health benefits, such as:  Improving your overall fitness, flexibility, and endurance.  Increasing your bone density.  Helping with weight control.  Decreasing your body fat.  Increasing your muscle strength.  Reducing stress and tension.  Improving your overall health. People with diabetes who exercise gain additional benefits because exercise:  Reduces appetite.  Improves the body's use of blood sugar (glucose).  Helps lower or control blood glucose.  Decreases blood pressure.  Helps control blood lipids (such as cholesterol and triglycerides).  Improves the body's use of the hormone insulin by:  Increasing the body's insulin sensitivity.  Reducing the body's insulin needs.  Decreases the risk for heart disease because exercising:  Lowers cholesterol and triglycerides levels.  Increases the levels of good cholesterol (such as high-density lipoproteins [HDL]) in the body.  Lowers blood glucose levels. YOUR ACTIVITY PLAN  Choose an activity that you enjoy, and set realistic goals. To exercise safely, you should begin practicing any new physical   activity slowly, and gradually increase the intensity of the exercise over time. Your health care provider or diabetes educator can help create an activity plan that works for you. General recommendations include:  Encouraging children to engage in at least 60 minutes of physical activity each day.  Stretching and performing strength training exercises, such as yoga or weight lifting, at least 2 times per week.  Performing a total of at least  150 minutes of moderate-intensity exercise each week, such as brisk walking or water aerobics.  Exercising at least 3 days per week, making sure you allow no more than 2 consecutive days to pass without exercising.  Avoiding long periods of inactivity (90 minutes or more). When you have to spend an extended period of time sitting down, take frequent breaks to walk or stretch. RECOMMENDATIONS FOR EXERCISING WITH TYPE 1 OR TYPE 2 DIABETES   Check your blood glucose before exercising. If blood glucose levels are greater than 240 mg/dL, check for urine ketones. Do not exercise if ketones are present.  Avoid injecting insulin into areas of the body that are going to be exercised. For example, avoid injecting insulin into:  The arms when playing tennis.  The legs when jogging.  Keep a record of:  Food intake before and after you exercise.  Expected peak times of insulin action.  Blood glucose levels before and after you exercise.  The type and amount of exercise you have done.  Review your records with your health care provider. Your health care provider will help you to develop guidelines for adjusting food intake and insulin amounts before and after exercising.  If you take insulin or oral hypoglycemic agents, watch for signs and symptoms of hypoglycemia. They include:  Dizziness.  Shaking.  Sweating.  Chills.  Confusion.  Drink plenty of water while you exercise to prevent dehydration or heat stroke. Body water is lost during exercise and must be replaced.  Talk to your health care provider before starting an exercise program to make sure it is safe for you. Remember, almost any type of activity is better than none.   This information is not intended to replace advice given to you by your health care provider. Make sure you discuss any questions you have with your health care provider.   Document Released: 04/11/2003 Document Revised: 06/05/2014 Document Reviewed:  06/28/2012 Elsevier Interactive Patient Education 2016 [employer] Basic Carbohydrate Counting for Diabetes Mellitus Carbohydrate counting is a method for keeping track of the amount of carbohydrates you eat. Eating carbohydrates naturally increases the level of sugar (glucose) in your blood, so it is important for you to know the amount that is okay for you to have in every meal. Carbohydrate counting helps keep the level of glucose in your blood within normal limits. The amount of carbohydrates allowed is different for every person. A dietitian can help you calculate the amount that is right for you. Once you know the amount of carbohydrates you can have, you can count the carbohydrates in the foods you want to eat. Carbohydrates are found in the following foods:  Grains, such as breads and cereals.  Dried beans and soy products.  Starchy vegetables, such as potatoes, peas, and corn.  Fruit and fruit juices.  Milk and yogurt.  Sweets and snack foods, such as cake, cookies, candy, chips, soft drinks, and fruit drinks. CARBOHYDRATE COUNTING There are two ways to count the carbohydrates in your food. You can use either of the methods or a combination of   both. Reading the "Nutrition Facts" on Packaged Food The "Nutrition Facts" is an area that is included on the labels of almost all packaged food and beverages in the [place]. It includes the serving size of that food or beverage and information about the nutrients in each serving of the food, including the grams (g) of carbohydrate per serving.  Decide the number of servings of this food or beverage that you will be able to eat or drink. Multiply that number of servings by the number of grams of carbohydrate that is listed on the label for that serving. The total will be the amount of carbohydrates you will be having when you eat or drink this food or beverage. Learning Standard Serving Sizes of Food When you eat food that is not  packaged or does not include "Nutrition Facts" on the label, you need to measure the servings in order to count the amount of carbohydrates.A serving of most carbohydrate-rich foods contains about 15 g of carbohydrates. The following list includes serving sizes of carbohydrate-rich foods that provide 15 g ofcarbohydrate per serving:   1 slice of bread (1 oz) or 1 six-inch tortilla.    of a hamburger bun or English muffin.  4-6 crackers.   cup unsweetened dry cereal.    cup hot cereal.   cup rice or pasta.    cup mashed potatoes or  of a large baked potato.  1 cup fresh fruit or one small piece of fruit.    cup canned or frozen fruit or fruit juice.  1 cup milk.   cup plain fat-free yogurt or yogurt sweetened with artificial sweeteners.   cup cooked dried beans or starchy vegetable, such as peas, corn, or potatoes.  Decide the number of standard-size servings that you will eat. Multiply that number of servings by 15 (the grams of carbohydrates in that serving). For example, if you eat 2 cups of strawberries, you will have eaten 2 servings and 30 g of carbohydrates (2 servings x 15 g = 30 g). For foods such as soups and casseroles, in which more than one food is mixed in, you will need to count the carbohydrates in each food that is included. EXAMPLE OF CARBOHYDRATE COUNTING Sample Dinner  3 oz chicken breast.   cup of brown rice.   cup of corn.  1 cup milk.   1 cup strawberries with sugar-free whipped topping.  Carbohydrate Calculation Step 1: Identify the foods that contain carbohydrates:   Rice.   Corn.   Milk.   Strawberries. Step 2:Calculate the number of servings eaten of each:   2 servings of rice.   1 serving of corn.   1 serving of milk.   1 serving of strawberries. Step 3: Multiply each of those number of servings by 15 g:   2 servings of rice x 15 g = 30 g.   1 serving of corn x 15 g = 15 g.   1 serving of milk x 15  g = 15 g.   1 serving of strawberries x 15 g = 15 g. Step 4: Add together all of the amounts to find the total grams of carbohydrates eaten: 30 g + 15 g + 15 g + 15 g = 75 g.   This information is not intended to replace advice given to you by your health care provider. Make sure you discuss any questions you have with your health care provider.   Document Released: 01/19/2005 Document Revised: 02/09/2014 Document   Reviewed: 12/16/2012 Elsevier Interactive Patient Education 2016 [employer]  

## 2015-02-21 NOTE — Progress Notes (Signed)
Patient ID: Stacy [name], female   DOB: 07-Dec-1960, 55 y.o.   MRN: [id number]   Stacy [name], is a 55 y.o. female  QMV:[id number]  MWU:[id number]  DOB - Mar 31, 1960  Chief Complaint  Patient presents with  . Follow-up    DM + HTN   Subjective:   Stacy [name] is a 55 y.o. female with history of hypertension, GERD, bilateral osteoarthritis of the knee, diabetes mellitus type 2, hyperlipidemia and COPD here today for a follow up visit. Patient has been out of all her medications for 2 months due to financial constraints. Patient is currently waiting for SSI therapy for her medications. She continues to [facility] with a walker because of bilateral knee pain for which she wears braces. She has appointment with orthopedic surgeon, not to reevaluate for possible bilateral knee replacement. Patient is constantly in pain due to osteoarthritis. She also has low back pain. She has history of gout but that is stable. She has no complaint today. She is up-to-date with her health maintenance. Her last colonoscopy was 2011, not due until 2021. Patient has No headache, No chest pain, No abdominal pain - No Nausea, No new weakness tingling or numbness, No Cough - SOB.  No problems updated.  ALLERGIES: Allergies  Allergen Reactions  . Other Shortness Of Breath    Allergic to perfumes and cleaning products  . Aspirin Nausea Only    unknown  . Ace Inhibitors Other (See Comments)    Cough, wheeze    PAST MEDICAL HISTORY: Past Medical History  Diagnosis Date  . Hypertension     Pt on lisinopril  . GERD (gastroesophageal reflux disease)     Pt on Protonix daily  . Arthritis     Takes Naprosyn   . Diabetes mellitus     Takes Metformin 750 mg  . Hyperlipidemia   . Asthma   . COPD (chronic obstructive pulmonary disease) (HCC)   . Shortness of breath     occasional - uses breathing tx at home  . Headache(784.0)     otc meds prn  . Irritable bowel syndrome 11/19/2010    MEDICATIONS AT  HOME: Prior to Admission medications   Medication Sig Start Date End Date Taking? Authorizing Provider  albuterol (PROVENTIL) (2.5 MG/3ML) 0.083% nebulizer solution Take 3 mLs (2.5 mg total) by nebulization every 4 (four) hours as needed for wheezing. 02/21/15  Yes Quentin Angst, MD  acetaminophen-codeine (TYLENOL #3) 300-30 MG tablet Take 1 tablet by mouth every 4 (four) hours as needed. 02/21/15   Quentin Angst, MD  allopurinol (ZYLOPRIM) 100 MG tablet Take 1 tablet (100 mg total) by mouth daily. 02/21/15   Quentin Angst, MD  Blood Glucose Monitoring Suppl (TRUE METRIX METER) DEVI 1 Device by Does not apply route 4 (four) times daily -  before meals and at bedtime. Patient not taking: Reported on 02/21/2015 11/14/14   Jaclyn Shaggy, MD  budesonide-formoterol (SYMBICORT) 80-4.5 MCG/ACT inhaler Inhale 2 puffs into the lungs 2 (two) times daily. 02/21/15 02/21/16  Quentin Angst, MD  colchicine 0.6 MG tablet Take 1 tablet (0.6 mg total) by mouth daily. 02/21/15   Quentin Angst, MD  cyclobenzaprine (FLEXERIL) 5 MG tablet Take 1 tablet (5 mg total) by mouth 3 (three) times daily as needed for muscle spasms. 02/21/15   Quentin Angst, MD  diazepam (VALIUM) 5 MG tablet Take 1 tablet (5 mg total) by mouth every 12 (twelve) hours as needed for muscle spasms. Patient not taking: Reported  on 02/21/2015 11/01/14   Nada Boozer Hess, PA-C  EPINEPHrine 0.3 mg/0.3 mL IJ SOAJ injection Inject 0.3 mLs (0.3 mg total) into the muscle once. Patient not taking: Reported on 02/21/2015 09/10/14   Quentin Angst, MD  gabapentin (NEURONTIN) 300 MG capsule Take 1 capsule (300 mg total) by mouth 3 (three) times daily. 02/21/15   Quentin Angst, MD  glucose blood (TRUE METRIX BLOOD GLUCOSE TEST) test strip USE AS DIRECTED BY PHYSICIAN Patient not taking: Reported on 02/21/2015 07/31/14   Quentin Angst, MD  glucose blood (TRUE METRIX BLOOD GLUCOSE TEST) test strip Use as instructed before meals  and at bedtime Patient not taking: Reported on 02/21/2015 11/14/14   Jaclyn Shaggy, MD  HYDROcodone-acetaminophen (NORCO/VICODIN) 5-325 MG per tablet Take 2 tablets by mouth every 4 (four) hours as needed. Patient not taking: Reported on 11/21/2014 08/15/14   Emilia Beck, PA-C  hydrocortisone-pramoxine [facility]) 2.5-1 % rectal cream Place 1 application rectally 2 (two) times daily. Patient not taking: Reported on 02/21/2015 11/16/14   Jaclyn Shaggy, MD  hydrOXYzine (ATARAX/VISTARIL) 10 MG tablet Take 1 tablet (10 mg total) by mouth 3 (three) times daily as needed. 02/21/15   Quentin Angst, MD  metFORMIN (GLUCOPHAGE-XR) 750 MG 24 hr tablet Take 1 tablet (750 mg total) by mouth daily with breakfast. 02/21/15   Quentin Angst, MD  metoCLOPramide (REGLAN) 5 MG tablet Take 1 tablet (5 mg total) by mouth every 8 (eight) hours as needed for nausea or vomiting. Patient not taking: Reported on 02/21/2015 05/15/13   Quentin Angst, MD  nicotine (NICODERM CQ - DOSED IN MG/24 HOURS) 21 mg/24hr patch Place 1 patch (21 mg total) onto the skin daily. Patient not taking: Reported on 11/29/2014 10/11/14   Quentin Angst, MD  ondansetron (ZOFRAN ODT) 4 MG disintegrating tablet Take 1 tablet (4 mg total) by mouth every 8 (eight) hours as needed for nausea or vomiting. Patient not taking: Reported on 02/21/2015 11/21/14   Leta Baptist, MD  pantoprazole (PROTONIX) 40 MG tablet Take 1 tablet (40 mg total) by mouth daily. 02/21/15   Quentin Angst, MD  simvastatin (ZOCOR) 20 MG tablet Take 1 tablet (20 mg total) by mouth at bedtime. 02/21/15   Quentin Angst, MD  valsartan-hydrochlorothiazide (DIOVAN-HCT) 160-12.5 MG tablet Take 1 tablet by mouth daily. 02/21/15   Quentin Angst, MD  Vitamin D, Ergocalciferol, (DRISDOL) 50000 UNITS CAPS capsule Take 1 capsule (50,000 Units total) by mouth every 7 (seven) weeks. Sundays Patient not taking: Reported on 02/21/2015 10/11/14   Quentin Angst, MD     Objective:   Filed Vitals:   02/21/15 1219  BP: 156/84  Pulse: 63  Temp: 98.5 F (36.9 C)  TempSrc: Oral  Resp: 18  Height:  (1.676 m)  Weight: 166 lb 9.6 oz (75.569 kg)  SpO2: 97%   Exam General appearance : Awake, alert, not in any distress. Speech Clear. Not toxic looking, walks with a walker HEENT: Atraumatic and Normocephalic, pupils equally reactive to light and accomodation Neck: supple, no JVD. No cervical lymphadenopathy.  Chest:Good air entry bilaterally, no added sounds  CVS: S1 S2 regular, no murmurs.  Abdomen: Bowel sounds present, Non tender and not distended with no gaurding, rigidity or rebound. Extremities: Bilateral knee joints with braces, B/L Lower Ext shows no edema, both legs are warm to touch Neurology: Awake alert, and oriented X 3, CN II-XII intact, Non focal  Data Review Lab Results  Component  Value Date   HGBA1C 6.30 02/21/2015   HGBA1C 6.40 10/11/2014   HGBA1C 6.30 07/19/2014     Assessment & Plan   1. Type 2 diabetes mellitus without complication, without long-term current use of insulin (HCC)  - POCT A1C - Glucose (CBG) - gabapentin (NEURONTIN) 300 MG capsule; Take 1 capsule (300 mg total) by mouth 3 (three) times daily.  Dispense: 270 capsule; Refill: 3 - metFORMIN (GLUCOPHAGE-XR) 750 MG 24 hr tablet; Take 1 tablet (750 mg total) by mouth daily with breakfast.  Dispense: 90 tablet; Refill: 3 - simvastatin (ZOCOR) 20 MG tablet; Take 1 tablet (20 mg total) by mouth at bedtime.  Dispense: 90 tablet; Refill: 3  Aim for 30 minutes of exercise most days. Rethink what you drink. Water is great! Aim for 2-3 Carb Choices per meal (30-45 grams) +/- 1 either way  Aim for 0-15 Carbs per snack if hungry  Include protein in moderation with your meals and snacks  Consider reading food labels for Total Carbohydrate and Fat Grams of foods  Consider checking BG at alternate times per day  Continue taking medication as  directed Be mindful about how much sugar you are adding to beverages and other foods. Fruit Punch - find one with no sugar  Measure and decrease portions of carbohydrate foods  Make your plate and don't go back for seconds  2. Bilateral low back pain without sciatica  - acetaminophen-codeine (TYLENOL #3) 300-30 MG tablet; Take 1 tablet by mouth every 4 (four) hours as needed.  Dispense: 90 tablet; Refill: 0 - cyclobenzaprine (FLEXERIL) 5 MG tablet; Take 1 tablet (5 mg total) by mouth 3 (three) times daily as needed for muscle spasms.  Dispense: 30 tablet; Refill: 3  3. Gout of big toe Refill - allopurinol (ZYLOPRIM) 100 MG tablet; Take 1 tablet (100 mg total) by mouth daily.  Dispense: 90 tablet; Refill: 3 - colchicine 0.6 MG tablet; Take 1 tablet (0.6 mg total) by mouth daily.  Dispense: 90 tablet; Refill: 3  4. Asthma, chronic, mild persistent, uncomplicated Refill - budesonide-formoterol (SYMBICORT) 80-4.5 MCG/ACT inhaler; Inhale 2 puffs into the lungs 2 (two) times daily.  Dispense: 3 Inhaler; Refill: 3 - albuterol (PROVENTIL) (2.5 MG/3ML) 0.083% nebulizer solution; Take 3 mLs (2.5 mg total) by nebulization every 4 (four) hours as needed for wheezing.  Dispense: 75 mL; Refill: 3 - hydrOXYzine (ATARAX/VISTARIL) 10 MG tablet; Take 1 tablet (10 mg total) by mouth 3 (three) times daily as needed.  Dispense: 30 tablet; Refill: 3  5. Gastroesophageal reflux disease without esophagitis  - pantoprazole (PROTONIX) 40 MG tablet; Take 1 tablet (40 mg total) by mouth daily.  Dispense: 90 tablet; Refill: 3  6. Essential hypertension  We have discussed target BP range and blood pressure goal. I have advised patient to check BP regularly and to call us back or report to clinic if the numbers are consistently higher than 140/90. We discussed the importance of compliance with medical therapy and DASH diet recommended, consequences of uncontrolled hypertension discussed.   - continue current BP  medications - valsartan-hydrochlorothiazide (DIOVAN-HCT) 160-12.5 MG tablet; Take 1 tablet by mouth daily.  Dispense: 90 tablet; Refill: 3  Patient have been counseled extensively about nutrition and exercise  Return in about 3 months (around 05/22/2015) for Hemoglobin A1C and Follow up, DM, Follow up HTN, Follow up Pain and comorbidities.  The patient was given clear instructions to go to ER or return to medical center if symptoms don't improve, worsen or new  problems develop. The patient verbalized understanding. The patient was told to call to get lab results if they haven't heard anything in the next week.   This note has been created with Education officer, environmental. Any transcriptional errors are unintentional.    Jeanann Lewandowsky, MD, MHA, Maxwell Caul, CPE [facility] [place], [place] [phone]   02/21/2015, 12:38 PM

## 2015-02-22 ENCOUNTER — Other Ambulatory Visit: Payer: Self-pay

## 2015-02-22 MED ORDER — GLUCOSE BLOOD VI STRP: ORAL_STRIP | Status: DC

## 2015-02-22 MED ORDER — ACCU-CHEK AVIVA PLUS W/DEVICE KIT: PACK | Status: DC

## 2015-02-22 MED ORDER — ACCU-CHEK SOFT TOUCH LANCETS MISC: Status: DC

## 2015-03-17 ENCOUNTER — Emergency Department (HOSPITAL_COMMUNITY): Payer: Medicaid Other

## 2015-03-17 ENCOUNTER — Emergency Department (HOSPITAL_COMMUNITY)
Admission: EM | Admit: 2015-03-17 | Discharge: 2015-03-17 | Disposition: A | Discharge status: Home / Self Care | Payer: Medicaid Other | Attending: Emergency Medicine | Admitting: Emergency Medicine

## 2015-03-17 ENCOUNTER — Encounter (HOSPITAL_COMMUNITY): Payer: Self-pay | Admitting: Emergency Medicine

## 2015-03-17 DIAGNOSIS — E785 Hyperlipidemia, unspecified: Secondary | ICD-10-CM | POA: Insufficient documentation

## 2015-03-17 DIAGNOSIS — M1990 Unspecified osteoarthritis, unspecified site: Secondary | ICD-10-CM | POA: Diagnosis not present

## 2015-03-17 DIAGNOSIS — Z79899 Other long term (current) drug therapy: Secondary | ICD-10-CM | POA: Diagnosis not present

## 2015-03-17 DIAGNOSIS — M545 Low back pain: Secondary | ICD-10-CM | POA: Diagnosis present

## 2015-03-17 DIAGNOSIS — F17210 Nicotine dependence, cigarettes, uncomplicated: Secondary | ICD-10-CM | POA: Insufficient documentation

## 2015-03-17 DIAGNOSIS — E119 Type 2 diabetes mellitus without complications: Secondary | ICD-10-CM | POA: Diagnosis not present

## 2015-03-17 DIAGNOSIS — I10 Essential (primary) hypertension: Secondary | ICD-10-CM | POA: Insufficient documentation

## 2015-03-17 DIAGNOSIS — K219 Gastro-esophageal reflux disease without esophagitis: Secondary | ICD-10-CM | POA: Insufficient documentation

## 2015-03-17 DIAGNOSIS — J449 Chronic obstructive pulmonary disease, unspecified: Secondary | ICD-10-CM | POA: Diagnosis not present

## 2015-03-17 DIAGNOSIS — Z7984 Long term (current) use of oral hypoglycemic drugs: Secondary | ICD-10-CM | POA: Insufficient documentation

## 2015-03-17 LAB — URINALYSIS, ROUTINE W REFLEX MICROSCOPIC
Bilirubin Urine: NEGATIVE
Glucose, UA: NEGATIVE mg/dL
Hgb urine dipstick: NEGATIVE
Ketones, ur: NEGATIVE mg/dL
Leukocytes, UA: NEGATIVE
Nitrite: NEGATIVE
Protein, ur: NEGATIVE mg/dL
Specific Gravity, Urine: 1.01 (ref 1.005–1.030)
pH: 6 (ref 5.0–8.0)

## 2015-03-17 MED ORDER — ACETAMINOPHEN 325 MG PO TABS: 650.0000 mg | ORAL_TABLET | Freq: Once | ORAL | Status: DC

## 2015-03-17 MED ORDER — IBUPROFEN 800 MG PO TABS
800.0000 mg | ORAL_TABLET | Freq: Once | ORAL | Status: AC
  Administered 2015-03-17: 800 mg via ORAL
  Filled 2015-03-17: qty 1

## 2015-03-17 MED ORDER — OXYCODONE-ACETAMINOPHEN 5-325 MG PO TABS
1.0000 | ORAL_TABLET | Freq: Once | ORAL | Status: AC
  Administered 2015-03-17: 1 via ORAL
  Filled 2015-03-17: qty 1

## 2015-03-17 MED ORDER — OXYCODONE-ACETAMINOPHEN 5-325 MG PO TABS: 1.0000 | ORAL_TABLET | ORAL | Status: DC | PRN

## 2015-03-17 MED ORDER — IBUPROFEN 800 MG PO TABS: 800.0000 mg | ORAL_TABLET | Freq: Three times a day (TID) | ORAL | Status: DC

## 2015-03-17 NOTE — Discharge Instructions (Signed)

## 2015-03-17 NOTE — ED Provider Notes (Signed)
CSN: [id number]     Arrival date & time 03/17/15  1626 History  By signing my name below, I, Hilda Lias, attest that this documentation has been prepared under the direction and in the presence of Gloriann Loan, PA-C.  Electronically Signed: Hilda Lias, ED Scribe. 03/17/2015. 6:26 PM.    No chief complaint on file.     The history is provided by the patient. No language interpreter was used.   HPI Comments: Stacy [name] is a 54 y.o. female who presents to the Emergency Department complaining of constant, worsening 9/10 bilateral lower back pain that radiates down the front of her legs to bilateral knees that has been present for a few days. Pt denies any known injury, but states she has problems with her discs in the 4th and 5th vertebrae of her spine. Pt reports associated tingling, intermittent vomiting (resolved). Pt states  feels like pins and needles, and reports movement makes it worse. Positioning makes her pain better. Pt states she vomited twice yesterday from the pain. Pt states she is able to ambulate, but states it is painful to do so, and states her knees are "giving out" on her when she tries to walk. Pt denies any loss of control of bowel or bladder, fever, chills, CP, SOB, or abdominal pain. Pt denies hx of IV drug use or of cancer.     Past Medical History  Diagnosis Date  . Hypertension     Pt on lisinopril  . GERD (gastroesophageal reflux disease)     Pt on Protonix daily  . Arthritis     Takes Naprosyn   . Diabetes mellitus     Takes Metformin 750 mg  . Hyperlipidemia   . Asthma   . COPD (chronic obstructive pulmonary disease) ([place])   . Shortness of breath     occasional - uses breathing tx at home  . Headache(784.0)     otc meds prn  . Irritable bowel syndrome 11/19/2010   Past Surgical History  Procedure Laterality Date  . Hernia repair    . Cholecystectomy    . Svd       x 2  . Tubal ligation    . Endometrial ablation  10/2010  . Upper  gastrointestinal endoscopy  04/28/11   Family History  Problem Relation Age of Onset  . Asthma Son     had as a child  . Heart disease Sister   . Breast cancer Sister   . Hypertension Father   . Cancer Father   . Hypertension Brother    Social History  Substance Use Topics  . Smoking status: Current Every Day Smoker -- 1.00 packs/day for 10 years    Types: Cigarettes  . Smokeless tobacco: Never Used  . Alcohol Use: No   OB History    No data available     Review of Systems  A complete 10 system review of systems was obtained and all systems are negative except as noted in the HPI and PMH.    Allergies  Other; Aspirin; and Ace inhibitors  Home Medications   Prior to Admission medications   Medication Sig Start Date End Date Taking? Authorizing Provider  acetaminophen-codeine (TYLENOL #3) 300-30 MG tablet Take 1 tablet by mouth every 4 (four) hours as needed. 02/21/15   Tresa Garter, MD  albuterol (PROVENTIL) (2.5 MG/3ML) 0.083% nebulizer solution Take 3 mLs (2.5 mg total) by nebulization every 4 (four) hours as needed for wheezing. 02/21/15   Olugbemiga E  Doreene Burke, MD  allopurinol (ZYLOPRIM) 100 MG tablet Take 1 tablet (100 mg total) by mouth daily. 02/21/15   Tresa Garter, MD  Blood Glucose Monitoring Suppl (ACCU-CHEK AVIVA PLUS) w/Device KIT Check blood sugar TID & QHS 02/22/15   Tresa Garter, MD  Blood Glucose Monitoring Suppl (TRUE METRIX METER) DEVI 1 Device by Does not apply route 4 (four) times daily -  before meals and at bedtime. Patient not taking: Reported on 02/21/2015 11/14/14   Arnoldo Morale, MD  budesonide-formoterol (SYMBICORT) 80-4.5 MCG/ACT inhaler Inhale 2 puffs into the lungs 2 (two) times daily. 02/21/15 02/21/16  Tresa Garter, MD  colchicine 0.6 MG tablet Take 1 tablet (0.6 mg total) by mouth daily. 02/21/15   Tresa Garter, MD  cyclobenzaprine (FLEXERIL) 5 MG tablet Take 1 tablet (5 mg total) by mouth 3 (three) times daily as  needed for muscle spasms. 02/21/15   Tresa Garter, MD  diazepam (VALIUM) 5 MG tablet Take 1 tablet (5 mg total) by mouth every 12 (twelve) hours as needed for muscle spasms. Patient not taking: Reported on 02/21/2015 11/01/14   Carman Ching, PA-C  EPINEPHrine 0.3 mg/0.3 mL IJ SOAJ injection Inject 0.3 mLs (0.3 mg total) into the muscle once. Patient not taking: Reported on 02/21/2015 09/10/14   Tresa Garter, MD  gabapentin (NEURONTIN) 300 MG capsule Take 1 capsule (300 mg total) by mouth 3 (three) times daily. 02/21/15   Tresa Garter, MD  glucose blood (ACCU-CHEK AVIVA) test strip Use as instructed 02/22/15   Tresa Garter, MD  glucose blood (TRUE METRIX BLOOD GLUCOSE TEST) test strip USE AS DIRECTED BY PHYSICIAN Patient not taking: Reported on 02/21/2015 07/31/14   Tresa Garter, MD  glucose blood (TRUE METRIX BLOOD GLUCOSE TEST) test strip Use as instructed before meals and at bedtime Patient not taking: Reported on 02/21/2015 11/14/14   Arnoldo Morale, MD  HYDROcodone-acetaminophen (NORCO/VICODIN) 5-325 MG per tablet Take 2 tablets by mouth every 4 (four) hours as needed. Patient not taking: Reported on 11/21/2014 08/15/14   Alvina Chou, PA-C  hydrocortisone-pramoxine [facility]) 2.5-1 % rectal cream Place 1 application rectally 2 (two) times daily. Patient not taking: Reported on 02/21/2015 11/16/14   Arnoldo Morale, MD  hydrOXYzine (ATARAX/VISTARIL) 10 MG tablet Take 1 tablet (10 mg total) by mouth 3 (three) times daily as needed. 02/21/15   Tresa Garter, MD  ibuprofen (ADVIL,MOTRIN) 800 MG tablet Take 1 tablet (800 mg total) by mouth 3 (three) times daily. 03/17/15   Gloriann Loan, PA-C  Lancets (ACCU-CHEK SOFT TOUCH) lancets Use as instructed 02/22/15   Tresa Garter, MD  metFORMIN (GLUCOPHAGE-XR) 750 MG 24 hr tablet Take 1 tablet (750 mg total) by mouth daily with breakfast. 02/21/15   Tresa Garter, MD  metoCLOPramide (REGLAN) 5 MG tablet Take 1 tablet  (5 mg total) by mouth every 8 (eight) hours as needed for nausea or vomiting. Patient not taking: Reported on 02/21/2015 05/15/13   Tresa Garter, MD  nicotine (NICODERM CQ - DOSED IN MG/24 HOURS) 21 mg/24hr patch Place 1 patch (21 mg total) onto the skin daily. Patient not taking: Reported on 11/29/2014 10/11/14   Tresa Garter, MD  ondansetron (ZOFRAN ODT) 4 MG disintegrating tablet Take 1 tablet (4 mg total) by mouth every 8 (eight) hours as needed for nausea or vomiting. Patient not taking: Reported on 02/21/2015 11/21/14   Harvel Quale, MD  oxyCODONE-acetaminophen (PERCOCET/ROXICET) 5-325 MG tablet Take 1 tablet by  mouth every 4 (four) hours as needed for severe pain. 03/17/15   Gloriann Loan, PA-C  pantoprazole (PROTONIX) 40 MG tablet Take 1 tablet (40 mg total) by mouth daily. 02/21/15   Tresa Garter, MD  simvastatin (ZOCOR) 20 MG tablet Take 1 tablet (20 mg total) by mouth at bedtime. 02/21/15   Tresa Garter, MD  valsartan-hydrochlorothiazide (DIOVAN-HCT) 160-12.5 MG tablet Take 1 tablet by mouth daily. 02/21/15   Tresa Garter, MD  Vitamin D, Ergocalciferol, (DRISDOL) 50000 UNITS CAPS capsule Take 1 capsule (50,000 Units total) by mouth every 7 (seven) weeks. Sundays Patient not taking: Reported on 02/21/2015 10/11/14   Tresa Garter, MD   BP 127/76 mmHg  Pulse 77  Temp(Src) 98.5 F (36.9 C) (Oral)  Resp 14  SpO2 99% Physical Exam  Constitutional: She is oriented to person, place, and time. She appears well-developed and well-nourished.  HENT:  Head: Atraumatic.  Eyes: Conjunctivae are normal.  Cardiovascular: Normal rate, regular rhythm, normal heart sounds and intact distal pulses.   Pulses:      Dorsalis pedis pulses are 2+ on the right side, and 2+ on the left side.  Pulmonary/Chest: Effort normal and breath sounds normal.  Abdominal: Soft. Bowel sounds are normal. She exhibits no distension. There is no tenderness.  Musculoskeletal: She exhibits  tenderness.  Lumbar spinous process tenderness.  No step offs. No crepitus.  Bilateral lumbar musculature TTP (R>L).  Neurological: She is alert and oriented to person, place, and time.  No saddle anesthesia. Strength and sensation intact bilaterally throughout lower extremities.  Skin: Skin is warm and dry.  Psychiatric: She has a normal mood and affect. Her behavior is normal.    ED Course  Procedures (including critical care time)  DIAGNOSTIC STUDIES: Oxygen Saturation is 99% on room air, normal by my interpretation.    COORDINATION OF CARE: 6:09 PM Discussed treatment plan with pt at bedside and pt agreed to plan.   Labs Review Labs Reviewed  URINALYSIS, ROUTINE W REFLEX MICROSCOPIC (NOT AT [facility])    Imaging Review Dg Lumbar Spine Complete  03/17/2015  CLINICAL DATA:  Low back pain radiating to both legs with nausea and vomiting since yesterday. Recent falls. EXAM: LUMBAR SPINE - COMPLETE 4+ VIEW COMPARISON:  Radiographs 11/03/2012.  MRI 10/02/2014. FINDINGS: There are 5 lumbar type vertebral bodies. The alignment is normal. There is chronic disc space loss and endplate sclerosis at V6-7, similar to prior studies. The additional disc spaces are preserved. No evidence of acute fracture or pars defect. Aortoiliac atherosclerosis noted. IMPRESSION: No acute osseous findings. Chronic degenerative disc disease at L4-5. Electronically Signed   By: Richardean Sale M.D.   On: 03/17/2015 18:58   I have personally reviewed and evaluated these images and lab results as part of my medical decision-making.   EKG Interpretation None      MDM   Final diagnoses:  Bilateral low back pain, with sciatica presence unspecified    Patient presents with low back pain.  VSS.  No injury/trauma.  No red flags.  No neurological deficits.  Distal pulses intact.  Suspect low back strain or other mechanical cause.  Patient given Percocet and ibuprofen in ED.  Doubt cauda equina.  Doubt infectious  process.  Doubt AAA.  Plan to discharge home with ibuprofen and short course norco.  Discussed return precautions to the ED.  Follow up with PCP.   I personally performed the services described in this documentation, which was scribed in my presence.  The recorded information has been reviewed and is accurate.    Gloriann Loan, PA-C 03/17/15 1923  Lacretia Leigh, MD 03/20/15 [phone]

## 2015-03-17 NOTE — ED Notes (Signed)
Patient presents for lumbar pain radiating to bilateral legs, nausea, emesis x2 onset yesterday. Rates pain 9/10. Denies loss of bowel or bladder, denies numbness or tingling.

## 2015-04-25 ENCOUNTER — Ambulatory Visit: Payer: Medicaid Other | Attending: Internal Medicine | Admitting: Internal Medicine

## 2015-04-25 ENCOUNTER — Encounter: Payer: Self-pay | Admitting: Internal Medicine

## 2015-04-25 VITALS — BP 159/95 | HR 71 | Temp 98.7°F | Resp 18 | Ht 66.0 in | Wt 174.8 lb

## 2015-04-25 DIAGNOSIS — J45909 Unspecified asthma, uncomplicated: Secondary | ICD-10-CM | POA: Diagnosis not present

## 2015-04-25 DIAGNOSIS — K589 Irritable bowel syndrome without diarrhea: Secondary | ICD-10-CM | POA: Diagnosis not present

## 2015-04-25 DIAGNOSIS — M545 Low back pain, unspecified: Secondary | ICD-10-CM

## 2015-04-25 DIAGNOSIS — Z888 Allergy status to other drugs, medicaments and biological substances status: Secondary | ICD-10-CM | POA: Insufficient documentation

## 2015-04-25 DIAGNOSIS — E785 Hyperlipidemia, unspecified: Secondary | ICD-10-CM | POA: Diagnosis not present

## 2015-04-25 DIAGNOSIS — E119 Type 2 diabetes mellitus without complications: Secondary | ICD-10-CM | POA: Insufficient documentation

## 2015-04-25 DIAGNOSIS — F17200 Nicotine dependence, unspecified, uncomplicated: Secondary | ICD-10-CM | POA: Insufficient documentation

## 2015-04-25 DIAGNOSIS — Z720 Tobacco use: Secondary | ICD-10-CM | POA: Insufficient documentation

## 2015-04-25 DIAGNOSIS — M179 Osteoarthritis of knee, unspecified: Secondary | ICD-10-CM | POA: Diagnosis not present

## 2015-04-25 DIAGNOSIS — Z79899 Other long term (current) drug therapy: Secondary | ICD-10-CM | POA: Insufficient documentation

## 2015-04-25 DIAGNOSIS — K219 Gastro-esophageal reflux disease without esophagitis: Secondary | ICD-10-CM | POA: Diagnosis not present

## 2015-04-25 DIAGNOSIS — I10 Essential (primary) hypertension: Secondary | ICD-10-CM | POA: Diagnosis not present

## 2015-04-25 DIAGNOSIS — J449 Chronic obstructive pulmonary disease, unspecified: Secondary | ICD-10-CM | POA: Diagnosis not present

## 2015-04-25 DIAGNOSIS — J4530 Mild persistent asthma, uncomplicated: Secondary | ICD-10-CM

## 2015-04-25 LAB — GLUCOSE, POCT (MANUAL RESULT ENTRY): POC Glucose: 109 mg/dl — AB (ref 70–99)

## 2015-04-25 MED ORDER — HYDROCHLOROTHIAZIDE 25 MG PO TABS: 25.0000 mg | ORAL_TABLET | Freq: Every day | ORAL | Status: DC

## 2015-04-25 MED ORDER — BUDESONIDE-FORMOTEROL FUMARATE 80-4.5 MCG/ACT IN AERO: 2.0000 | INHALATION_SPRAY | Freq: Two times a day (BID) | RESPIRATORY_TRACT | Status: DC

## 2015-04-25 MED ORDER — ALBUTEROL SULFATE (2.5 MG/3ML) 0.083% IN NEBU
2.5000 mg | INHALATION_SOLUTION | RESPIRATORY_TRACT | Status: DC | PRN
Start: 2015-04-25 — End: 2015-11-01

## 2015-04-25 MED ORDER — ACETAMINOPHEN-CODEINE #3 300-30 MG PO TABS: 1.0000 | ORAL_TABLET | ORAL | Status: DC | PRN

## 2015-04-25 NOTE — Progress Notes (Signed)
Patient ID: Stacy [name], female   DOB: 01-28-1961, 55 y.o.   MRN: [id number]   Stacy [name], is a [age] y.o. female  [id number]  [id number]  DOB - 01-28-1961  Chief Complaint  Patient presents with  . Follow-up    DM        Subjective:   Stacy [name] is a 55 y.o. female here today for a follow up visit. Patient has history of hypertension, GERD, bilateral osteoarthritis of the knee, type 2 diabetes mellitus, hyperlipidemia and COPD with ongoing tobacco abuse. She needs refills on her medications. She has no new complaint today except for ongoing bilateral knee pain for which she will has braces. She is currently being considered for bilateral knee replacement by the orthopedic surgeon. She needs some pain medication today. Patient has No headache, No chest pain, No abdominal pain - No Nausea, No new weakness tingling or numbness, No Cough - SOB.  Problem  Tobacco Use Disorder    ALLERGIES: Allergies  Allergen Reactions  . Other Shortness Of Breath    Allergic to perfumes and cleaning products  . Aspirin Nausea Only    unknown  . Ace Inhibitors Other (See Comments)    Cough, wheeze    PAST MEDICAL HISTORY: Past Medical History  Diagnosis Date  . Hypertension     Pt on lisinopril  . GERD (gastroesophageal reflux disease)     Pt on Protonix daily  . Arthritis     Takes Naprosyn   . Diabetes mellitus     Takes Metformin 750 mg  . Hyperlipidemia   . Asthma   . COPD (chronic obstructive pulmonary disease) (HCC)   . Shortness of breath     occasional - uses breathing tx at home  . Headache(784.0)     otc meds prn  . Irritable bowel syndrome 11/19/2010    MEDICATIONS AT HOME: Prior to Admission medications   Medication Sig Start Date End Date Taking? Authorizing Provider  acetaminophen-codeine (TYLENOL #3) 300-30 MG tablet Take 1 tablet by mouth every 4 (four) hours as needed. 04/25/15  Yes Quentin Angstlugbemiga E Hilton Saephan, MD  albuterol (PROVENTIL) (2.5 MG/3ML) 0.083%  nebulizer solution Take 3 mLs (2.5 mg total) by nebulization every 4 (four) hours as needed for wheezing. 04/25/15  Yes Quentin Angstlugbemiga E Britnee Mcdevitt, MD  allopurinol (ZYLOPRIM) 100 MG tablet Take 1 tablet (100 mg total) by mouth daily. 02/21/15  Yes Quentin Angstlugbemiga E Roann Merk, MD  budesonide-formoterol (SYMBICORT) 80-4.5 MCG/ACT inhaler Inhale 2 puffs into the lungs 2 (two) times daily. 04/25/15 04/24/16 Yes Gid Schoffstall Annitta NeedsE Daquavion Catala, MD  colchicine 0.6 MG tablet Take 1 tablet (0.6 mg total) by mouth daily. 02/21/15  Yes Quentin Angstlugbemiga E Carlosdaniel Grob, MD  cyclobenzaprine (FLEXERIL) 5 MG tablet Take 1 tablet (5 mg total) by mouth 3 (three) times daily as needed for muscle spasms. 02/21/15  Yes Quentin Angstlugbemiga E Otis Burress, MD  gabapentin (NEURONTIN) 300 MG capsule Take 1 capsule (300 mg total) by mouth 3 (three) times daily. 02/21/15  Yes Quentin Angstlugbemiga E Adalyn Pennock, MD  glucose blood (ACCU-CHEK AVIVA) test strip Use as instructed 02/22/15  Yes Quentin Angstlugbemiga E Ryo Klang, MD  hydrOXYzine (ATARAX/VISTARIL) 10 MG tablet Take 1 tablet (10 mg total) by mouth 3 (three) times daily as needed. 02/21/15  Yes Quentin Angstlugbemiga E Nekita Pita, MD  Lancets (ACCU-CHEK SOFT TOUCH) lancets Use as instructed 02/22/15  Yes Quentin Angstlugbemiga E Elba Dendinger, MD  metFORMIN (GLUCOPHAGE-XR) 750 MG 24 hr tablet Take 1 tablet (750 mg total) by mouth daily with breakfast. 02/21/15  Yes Quentin Angstlugbemiga E Masyn Rostro, MD  pantoprazole (PROTONIX) 40 MG tablet Take 1 tablet (40 mg total) by mouth daily. 02/21/15  Yes Quentin Angst, MD  simvastatin (ZOCOR) 20 MG tablet Take 1 tablet (20 mg total) by mouth at bedtime. 02/21/15  Yes Quentin Angst, MD  Blood Glucose Monitoring Suppl (TRUE METRIX METER) DEVI 1 Device by Does not apply route 4 (four) times daily -  before meals and at bedtime. Patient not taking: Reported on 02/21/2015 11/14/14   Jaclyn Shaggy, MD  EPINEPHrine 0.3 mg/0.3 mL IJ SOAJ injection Inject 0.3 mLs (0.3 mg total) into the muscle once. Patient not taking: Reported on 02/21/2015 09/10/14   Quentin Angst, MD  glucose blood (TRUE METRIX BLOOD GLUCOSE TEST) test strip USE AS DIRECTED BY PHYSICIAN Patient not taking: Reported on 02/21/2015 07/31/14   Quentin Angst, MD  hydrochlorothiazide (HYDRODIURIL) 25 MG tablet Take 1 tablet (25 mg total) by mouth daily. 04/25/15   Quentin Angst, MD  hydrocortisone-pramoxine [facility]) 2.5-1 % rectal cream Place 1 application rectally 2 (two) times daily. Patient not taking: Reported on 02/21/2015 11/16/14   Jaclyn Shaggy, MD  nicotine (NICODERM CQ - DOSED IN MG/24 HOURS) 21 mg/24hr patch Place 1 patch (21 mg total) onto the skin daily. Patient not taking: Reported on 11/29/2014 10/11/14   Quentin Angst, MD  Vitamin D, Ergocalciferol, (DRISDOL) 50000 UNITS CAPS capsule Take 1 capsule (50,000 Units total) by mouth every 7 (seven) weeks. Sundays Patient not taking: Reported on 02/21/2015 10/11/14   Quentin Angst, MD     Objective:   Filed Vitals:   04/25/15 1147  BP: 159/95  Pulse: 71  Temp: 98.7 F (37.1 C)  TempSrc: Oral  Resp: 18  Height:  (1.676 m)  Weight: 174 lb 12.8 oz (79.289 kg)  SpO2: 99%    Exam General appearance : Awake, alert, not in any distress. Speech Clear. Not toxic looking, walks with a walker HEENT: Atraumatic and Normocephalic, pupils equally reactive to light and accomodation Neck: supple, no JVD. No cervical lymphadenopathy.  Chest:Good air entry bilaterally, no added sounds  CVS: S1 S2 regular, no murmurs.  Abdomen: Bowel sounds present, Non tender and not distended with no gaurding, rigidity or rebound. Extremities: B/L Lower Ext shows no edema, Bilateral knee braces, both legs are warm to touch Neurology: Awake alert, and oriented X 3, CN II-XII intact, Non focal Skin:No Rash  Data Review Lab Results  Component Value Date   HGBA1C 6.30 02/21/2015   HGBA1C 6.40 10/11/2014   HGBA1C 6.30 07/19/2014     Assessment & Plan   1. Type 2 diabetes mellitus without complication, without  long-term current use of insulin (HCC)  - Glucose (CBG)  Aim for 30 minutes of exercise most days. Rethink what you drink. Water is great! Aim for 2-3 Carb Choices per meal (30-45 grams) +/- 1 either way  Aim for 0-15 Carbs per snack if hungry  Include protein in moderation with your meals and snacks  Consider reading food labels for Total Carbohydrate and Fat Grams of foods  Consider checking BG at alternate times per day  Continue taking medication as directed Be mindful about how much sugar you are adding to beverages and other foods. Fruit Punch - find one with no sugar  Measure and decrease portions of carbohydrate foods  Make your plate and don't go back for seconds  2. Bilateral low back pain without sciatica  - acetaminophen-codeine (TYLENOL #3) 300-30 MG tablet; Take 1 tablet by mouth  every 4 (four) hours as needed.  Dispense: 90 tablet; Refill: 0  3. Tobacco use disorder  Siobahn was counseled on the dangers of tobacco use, and was advised to quit. Reviewed strategies to maximize success, including removing cigarettes and smoking materials from environment, stress management and support of family/friends.  4. Essential hypertension D/C Diovan (Not covered by insurance) Start - hydrochlorothiazide (HYDRODIURIL) 25 MG tablet; Take 1 tablet (25 mg total) by mouth daily.  Dispense: 90 tablet; Refill: 3  We have discussed target BP range and blood pressure goal. I have advised patient to check BP regularly and to call us back or report to clinic if the numbers are consistently higher than 140/90. We discussed the importance of compliance with medical therapy and DASH diet recommended, consequences of uncontrolled hypertension discussed.   - continue current BP medications  5. Asthma, chronic, mild persistent, uncomplicated Refill - budesonide-formoterol (SYMBICORT) 80-4.5 MCG/ACT inhaler; Inhale 2 puffs into the lungs 2 (two) times daily.  Dispense: 3 Inhaler; Refill: 3 -  albuterol (PROVENTIL) (2.5 MG/3ML) 0.083% nebulizer solution; Take 3 mLs (2.5 mg total) by nebulization every 4 (four) hours as needed for wheezing.  Dispense: 75 mL; Refill: 3  Patient have been counseled extensively about nutrition and exercise  Return in about 4 weeks (around 05/23/2015) for Hemoglobin A1C and Follow up, DM, Follow up Pain and comorbidities, Follow up HTN.  The patient was given clear instructions to go to ER or return to medical center if symptoms don't improve, worsen or new problems develop. The patient verbalized understanding. The patient was told to call to get lab results if they haven't heard anything in the next week.   This note has been created with Education officer, environmental. Any transcriptional errors are unintentional.    Jeanann Lewandowsky, MD, MHA, Maxwell Caul, CPE [facility] [place], [place] [phone]   04/25/2015, 12:17 PM

## 2015-04-25 NOTE — Patient Instructions (Signed)
Diabetes and Exercise Exercising regularly is important. It is not just about losing weight. It has many health benefits, such as:  Improving your overall fitness, flexibility, and endurance.  Increasing your bone density.  Helping with weight control.  Decreasing your body fat.  Increasing your muscle strength.  Reducing stress and tension.  Improving your overall health. People with diabetes who exercise gain additional benefits because exercise:  Reduces appetite.  Improves the body's use of blood sugar (glucose).  Helps lower or control blood glucose.  Decreases blood pressure.  Helps control blood lipids (such as cholesterol and triglycerides).  Improves the body's use of the hormone insulin by:  Increasing the body's insulin sensitivity.  Reducing the body's insulin needs.  Decreases the risk for heart disease because exercising:  Lowers cholesterol and triglycerides levels.  Increases the levels of good cholesterol (such as high-density lipoproteins [HDL]) in the body.  Lowers blood glucose levels. YOUR ACTIVITY PLAN  Choose an activity that you enjoy, and set realistic goals. To exercise safely, you should begin practicing any new physical activity slowly, and gradually increase the intensity of the exercise over time. Your health care provider or diabetes educator can help create an activity plan that works for you. General recommendations include:  Encouraging children to engage in at least 60 minutes of physical activity each day.  Stretching and performing strength training exercises, such as yoga or weight lifting, at least 2 times per week.  Performing a total of at least 150 minutes of moderate-intensity exercise each week, such as brisk walking or water aerobics.  Exercising at least 3 days per week, making sure you allow no more than 2 consecutive days to pass without exercising.  Avoiding long periods of inactivity (90 minutes or more). When you  have to spend an extended period of time sitting down, take frequent breaks to walk or stretch. RECOMMENDATIONS FOR EXERCISING WITH TYPE 1 OR TYPE 2 DIABETES   Check your blood glucose before exercising. If blood glucose levels are greater than 240 mg/dL, check for urine ketones. Do not exercise if ketones are present.  Avoid injecting insulin into areas of the body that are going to be exercised. For example, avoid injecting insulin into:  The arms when playing tennis.  The legs when jogging.  Keep a record of:  Food intake before and after you exercise.  Expected peak times of insulin action.  Blood glucose levels before and after you exercise.  The type and amount of exercise you have done.  Review your records with your health care provider. Your health care provider will help you to develop guidelines for adjusting food intake and insulin amounts before and after exercising.  If you take insulin or oral hypoglycemic agents, watch for signs and symptoms of hypoglycemia. They include:  Dizziness.  Shaking.  Sweating.  Chills.  Confusion.  Drink plenty of water while you exercise to prevent dehydration or heat stroke. Body water is lost during exercise and must be replaced.  Talk to your health care provider before starting an exercise program to make sure it is safe for you. Remember, almost any type of activity is better than none.   This information is not intended to replace advice given to you by your health care provider. Make sure you discuss any questions you have with your health care provider.   Document Released: 04/11/2003 Document Revised: 06/05/2014 Document Reviewed: 06/28/2012 Elsevier Interactive Patient Education 2016 [employer] Basic Carbohydrate Counting for Diabetes Mellitus Carbohydrate counting   is a method for keeping track of the amount of carbohydrates you eat. Eating carbohydrates naturally increases the level of sugar (glucose) in your  blood, so it is important for you to know the amount that is okay for you to have in every meal. Carbohydrate counting helps keep the level of glucose in your blood within normal limits. The amount of carbohydrates allowed is different for every person. A dietitian can help you calculate the amount that is right for you. Once you know the amount of carbohydrates you can have, you can count the carbohydrates in the foods you want to eat. Carbohydrates are found in the following foods:  Grains, such as breads and cereals.  Dried beans and soy products.  Starchy vegetables, such as potatoes, peas, and corn.  Fruit and fruit juices.  Milk and yogurt.  Sweets and snack foods, such as cake, cookies, candy, chips, soft drinks, and fruit drinks. CARBOHYDRATE COUNTING There are two ways to count the carbohydrates in your food. You can use either of the methods or a combination of both. Reading the "Nutrition Facts" on Packaged Food The "Nutrition Facts" is an area that is included on the labels of almost all packaged food and beverages in the [place]. It includes the serving size of that food or beverage and information about the nutrients in each serving of the food, including the grams (g) of carbohydrate per serving.  Decide the number of servings of this food or beverage that you will be able to eat or drink. Multiply that number of servings by the number of grams of carbohydrate that is listed on the label for that serving. The total will be the amount of carbohydrates you will be having when you eat or drink this food or beverage. Learning Standard Serving Sizes of Food When you eat food that is not packaged or does not include "Nutrition Facts" on the label, you need to measure the servings in order to count the amount of carbohydrates.A serving of most carbohydrate-rich foods contains about 15 g of carbohydrates. The following list includes serving sizes of carbohydrate-rich foods that  provide 15 g ofcarbohydrate per serving:   1 slice of bread (1 oz) or 1 six-inch tortilla.    of a hamburger bun or English muffin.  4-6 crackers.   cup unsweetened dry cereal.    cup hot cereal.   cup rice or pasta.    cup mashed potatoes or  of a large baked potato.  1 cup fresh fruit or one small piece of fruit.    cup canned or frozen fruit or fruit juice.  1 cup milk.   cup plain fat-free yogurt or yogurt sweetened with artificial sweeteners.   cup cooked dried beans or starchy vegetable, such as peas, corn, or potatoes.  Decide the number of standard-size servings that you will eat. Multiply that number of servings by 15 (the grams of carbohydrates in that serving). For example, if you eat 2 cups of strawberries, you will have eaten 2 servings and 30 g of carbohydrates (2 servings x 15 g = 30 g). For foods such as soups and casseroles, in which more than one food is mixed in, you will need to count the carbohydrates in each food that is included. EXAMPLE OF CARBOHYDRATE COUNTING Sample Dinner  3 oz chicken breast.   cup of brown rice.   cup of corn.  1 cup milk.   1 cup strawberries with sugar-free whipped topping.  Carbohydrate Calculation Step   1: Identify the foods that contain carbohydrates:   Rice.   Corn.   Milk.   Strawberries. Step 2:Calculate the number of servings eaten of each:   2 servings of rice.   1 serving of corn.   1 serving of milk.   1 serving of strawberries. Step 3: Multiply each of those number of servings by 15 g:   2 servings of rice x 15 g = 30 g.   1 serving of corn x 15 g = 15 g.   1 serving of milk x 15 g = 15 g.   1 serving of strawberries x 15 g = 15 g. Step 4: Add together all of the amounts to find the total grams of carbohydrates eaten: 30 g + 15 g + 15 g + 15 g = 75 g.   This information is not intended to replace advice given to you by your health care provider. Make sure you  discuss any questions you have with your health care provider.   Document Released: 01/19/2005 Document Revised: 02/09/2014 Document Reviewed: 12/16/2012 Elsevier Interactive Patient Education 2016 [employer] DASH Eating Plan DASH stands for "Dietary Approaches to Stop Hypertension." The DASH eating plan is a healthy eating plan that has been shown to reduce high blood pressure (hypertension). Additional health benefits may include reducing the risk of type 2 diabetes mellitus, heart disease, and stroke. The DASH eating plan may also help with weight loss. WHAT DO I NEED TO KNOW ABOUT THE DASH EATING PLAN? For the DASH eating plan, you will follow these general guidelines:  Choose foods with a percent daily value for sodium of less than 5% (as listed on the food label).  Use salt-free seasonings or herbs instead of table salt or sea salt.  Check with your health care provider or pharmacist before using salt substitutes.  Eat lower-sodium products, often labeled as "lower sodium" or "no salt added."  Eat fresh foods.  Eat more vegetables, fruits, and low-fat dairy products.  Choose whole grains. Look for the word "whole" as the first word in the ingredient list.  Choose fish and skinless chicken or turkey more often than red meat. Limit fish, poultry, and meat to 6 oz (170 g) each day.  Limit sweets, desserts, sugars, and sugary drinks.  Choose heart-healthy fats.  Limit cheese to 1 oz (28 g) per day.  Eat more home-cooked food and less restaurant, buffet, and fast food.  Limit fried foods.  Cook foods using methods other than frying.  Limit canned vegetables. If you do use them, rinse them well to decrease the sodium.  When eating at a restaurant, ask that your food be prepared with less salt, or no salt if possible. WHAT FOODS CAN I EAT? Seek help from a dietitian for individual calorie needs. Grains Whole grain or whole wheat bread. Brown rice. Whole grain or whole  wheat pasta. Quinoa, bulgur, and whole grain cereals. Low-sodium cereals. Corn or whole wheat flour tortillas. Whole grain cornbread. Whole grain crackers. Low-sodium crackers. Vegetables Fresh or frozen vegetables (raw, steamed, roasted, or grilled). Low-sodium or reduced-sodium tomato and vegetable juices. Low-sodium or reduced-sodium tomato sauce and paste. Low-sodium or reduced-sodium canned vegetables.  Fruits All fresh, canned (in natural juice), or frozen fruits. Meat and Other Protein Products Ground beef (85% or leaner), grass-fed beef, or beef trimmed of fat. Skinless chicken or turkey. Ground chicken or turkey. Pork trimmed of fat. All fish and seafood. Eggs. Dried beans, peas, or lentils. Unsalted nuts   and seeds. Unsalted canned beans. Dairy Low-fat dairy products, such as skim or 1% milk, 2% or reduced-fat cheeses, low-fat ricotta or cottage cheese, or plain low-fat yogurt. Low-sodium or reduced-sodium cheeses. Fats and Oils Tub margarines without trans fats. Light or reduced-fat mayonnaise and salad dressings (reduced sodium). Avocado. Safflower, olive, or canola oils. Natural peanut or almond butter. Other Unsalted popcorn and pretzels. The items listed above may not be a complete list of recommended foods or beverages. Contact your dietitian for more options. WHAT FOODS ARE NOT RECOMMENDED? Grains White bread. White pasta. White rice. Refined cornbread. Bagels and croissants. Crackers that contain trans fat. Vegetables Creamed or fried vegetables. Vegetables in a cheese sauce. Regular canned vegetables. Regular canned tomato sauce and paste. Regular tomato and vegetable juices. Fruits Dried fruits. Canned fruit in light or heavy syrup. Fruit juice. Meat and Other Protein Products Fatty cuts of meat. Ribs, chicken wings, bacon, sausage, bologna, salami, chitterlings, fatback, hot dogs, bratwurst, and packaged luncheon meats. Salted nuts and seeds. Canned beans with  salt. Dairy Whole or 2% milk, cream, half-and-half, and cream cheese. Whole-fat or sweetened yogurt. Full-fat cheeses or blue cheese. Nondairy creamers and whipped toppings. Processed cheese, cheese spreads, or cheese curds. Condiments Onion and garlic salt, seasoned salt, table salt, and sea salt. Canned and packaged gravies. Worcestershire sauce. Tartar sauce. Barbecue sauce. Teriyaki sauce. Soy sauce, including reduced sodium. Steak sauce. Fish sauce. Oyster sauce. Cocktail sauce. Horseradish. Ketchup and mustard. Meat flavorings and tenderizers. Bouillon cubes. Hot sauce. Tabasco sauce. Marinades. Taco seasonings. Relishes. Fats and Oils Butter, stick margarine, lard, shortening, ghee, and bacon fat. Coconut, palm kernel, or palm oils. Regular salad dressings. Other Pickles and olives. Salted popcorn and pretzels. The items listed above may not be a complete list of foods and beverages to avoid. Contact your dietitian for more information. WHERE CAN I FIND MORE INFORMATION? National Heart, [employer], [employer] Institute: www.nhlbi.nih.gov/health/health-topics/topics/dash/   This information is not intended to replace advice given to you by your health care provider. Make sure you discuss any questions you have with your health care provider.   Document Released: 01/08/2011 Document Revised: 02/09/2014 Document Reviewed: 11/23/2012 Elsevier Interactive Patient Education 2016 [employer] Hypertension Hypertension, commonly called high blood pressure, is when the force of blood pumping through your arteries is too strong. Your arteries are the blood vessels that carry blood from your heart throughout your body. A blood pressure reading consists of a higher number over a lower number, such as 110/72. The higher number (systolic) is the pressure inside your arteries when your heart pumps. The lower number (diastolic) is the pressure inside your arteries when your heart relaxes. Ideally you want your  blood pressure below 120/80. Hypertension forces your heart to work harder to pump blood. Your arteries may become narrow or stiff. Having untreated or uncontrolled hypertension can cause heart attack, stroke, kidney disease, and other problems. RISK FACTORS Some risk factors for high blood pressure are controllable. Others are not.  Risk factors you cannot control include:   Race. You may be at higher risk if you are African American.  Age. Risk increases with age.  Gender. Men are at higher risk than women before age 45 years. After age 65, women are at higher risk than men. Risk factors you can control include:  Not getting enough exercise or physical activity.  Being overweight.  Getting too much fat, sugar, calories, or salt in your diet.  Drinking too much alcohol. SIGNS AND SYMPTOMS Hypertension does   not usually cause signs or symptoms. Extremely high blood pressure (hypertensive crisis) may cause headache, anxiety, shortness of breath, and nosebleed. DIAGNOSIS To check if you have hypertension, your health care provider will measure your blood pressure while you are seated, with your arm held at the level of your heart. It should be measured at least twice using the same arm. Certain conditions can cause a difference in blood pressure between your right and left arms. A blood pressure reading that is higher than normal on one occasion does not mean that you need treatment. If it is not clear whether you have high blood pressure, you may be asked to return on a different day to have your blood pressure checked again. Or, you may be asked to monitor your blood pressure at home for 1 or more weeks. TREATMENT Treating high blood pressure includes making lifestyle changes and possibly taking medicine. Living a healthy lifestyle can help lower high blood pressure. You may need to change some of your habits. Lifestyle changes may include:  Following the DASH diet. This diet is high in  fruits, vegetables, and whole grains. It is low in salt, red meat, and added sugars.  Keep your sodium intake below 2,300 mg per day.  Getting at least 30-45 minutes of aerobic exercise at least 4 times per week.  Losing weight if necessary.  Not smoking.  Limiting alcoholic beverages.  Learning ways to reduce stress. Your health care provider may prescribe medicine if lifestyle changes are not enough to get your blood pressure under control, and if one of the following is true:  You are 18-59 years of age and your systolic blood pressure is above 140.  You are 60 years of age or older, and your systolic blood pressure is above 150.  Your diastolic blood pressure is above 90.  You have diabetes, and your systolic blood pressure is over 140 or your diastolic blood pressure is over 90.  You have kidney disease and your blood pressure is above 140/90.  You have heart disease and your blood pressure is above 140/90. Your personal target blood pressure may vary depending on your medical conditions, your age, and other factors. HOME CARE INSTRUCTIONS  Have your blood pressure rechecked as directed by your health care provider.   Take medicines only as directed by your health care provider. Follow the directions carefully. Blood pressure medicines must be taken as prescribed. The medicine does not work as well when you skip doses. Skipping doses also puts you at risk for problems.  Do not smoke.   Monitor your blood pressure at home as directed by your health care provider. SEEK MEDICAL CARE IF:   You think you are having a reaction to medicines taken.  You have recurrent headaches or feel dizzy.  You have swelling in your ankles.  You have trouble with your vision. SEEK IMMEDIATE MEDICAL CARE IF:  You develop a severe headache or confusion.  You have unusual weakness, numbness, or feel faint.  You have severe chest or abdominal pain.  You vomit repeatedly.  You  have trouble breathing. MAKE SURE YOU:   Understand these instructions.  Will watch your condition.  Will get help right away if you are not doing well or get worse.   This information is not intended to replace advice given to you by your health care provider. Make sure you discuss any questions you have with your health care provider.   Document Released: 01/19/2005 Document Revised: 06/05/2014 Document   Reviewed: 11/11/2012 Elsevier Interactive Patient Education 2016 [employer] Inc[employer]  

## 2015-04-25 NOTE — Progress Notes (Signed)
Patient is here for FU DM  Patient denies pain at this time. Patient recently left Ortho appointment and received a steroid shot in her knee.  Patient request refills for Albuterol, Symbicort inhaler and Flexeril.  Patient has not taken her medications or eaten today.

## 2015-05-30 ENCOUNTER — Encounter: Payer: Self-pay | Admitting: Clinical

## 2015-05-30 ENCOUNTER — Ambulatory Visit: Payer: Medicaid Other | Attending: Internal Medicine | Admitting: Internal Medicine

## 2015-05-30 ENCOUNTER — Encounter: Payer: Self-pay | Admitting: Internal Medicine

## 2015-05-30 VITALS — BP 128/86 | HR 78 | Temp 98.0°F | Resp 18 | Ht 66.0 in | Wt 156.0 lb

## 2015-05-30 DIAGNOSIS — M109 Gout, unspecified: Secondary | ICD-10-CM

## 2015-05-30 DIAGNOSIS — J45909 Unspecified asthma, uncomplicated: Secondary | ICD-10-CM | POA: Insufficient documentation

## 2015-05-30 DIAGNOSIS — E119 Type 2 diabetes mellitus without complications: Secondary | ICD-10-CM

## 2015-05-30 DIAGNOSIS — E785 Hyperlipidemia, unspecified: Secondary | ICD-10-CM | POA: Diagnosis not present

## 2015-05-30 DIAGNOSIS — G8929 Other chronic pain: Secondary | ICD-10-CM | POA: Insufficient documentation

## 2015-05-30 DIAGNOSIS — M545 Low back pain, unspecified: Secondary | ICD-10-CM

## 2015-05-30 DIAGNOSIS — F17200 Nicotine dependence, unspecified, uncomplicated: Secondary | ICD-10-CM

## 2015-05-30 DIAGNOSIS — M170 Bilateral primary osteoarthritis of knee: Secondary | ICD-10-CM | POA: Insufficient documentation

## 2015-05-30 DIAGNOSIS — Z79899 Other long term (current) drug therapy: Secondary | ICD-10-CM | POA: Insufficient documentation

## 2015-05-30 DIAGNOSIS — J449 Chronic obstructive pulmonary disease, unspecified: Secondary | ICD-10-CM | POA: Insufficient documentation

## 2015-05-30 DIAGNOSIS — F17210 Nicotine dependence, cigarettes, uncomplicated: Secondary | ICD-10-CM | POA: Diagnosis not present

## 2015-05-30 DIAGNOSIS — M1000 Idiopathic gout, unspecified site: Secondary | ICD-10-CM

## 2015-05-30 DIAGNOSIS — K219 Gastro-esophageal reflux disease without esophagitis: Secondary | ICD-10-CM | POA: Insufficient documentation

## 2015-05-30 DIAGNOSIS — I10 Essential (primary) hypertension: Secondary | ICD-10-CM

## 2015-05-30 DIAGNOSIS — J4530 Mild persistent asthma, uncomplicated: Secondary | ICD-10-CM

## 2015-05-30 LAB — CBC WITH DIFFERENTIAL/PLATELET
Basophils Absolute: 0 cells/uL (ref 0–200)
Basophils Relative: 0 %
Eosinophils Absolute: 75 cells/uL (ref 15–500)
Eosinophils Relative: 1 %
HCT: 44.5 % (ref 35.0–45.0)
Hemoglobin: 14.8 g/dL (ref 11.7–15.5)
Lymphocytes Relative: 52 %
Lymphs Abs: 3900 cells/uL (ref 850–3900)
MCH: 28 pg (ref 27.0–33.0)
MCHC: 33.3 g/dL (ref 32.0–36.0)
MCV: 84.1 fL (ref 80.0–100.0)
MPV: 10.8 fL (ref 7.5–12.5)
Monocytes Absolute: 375 cells/uL (ref 200–950)
Monocytes Relative: 5 %
Neutro Abs: 3150 cells/uL (ref 1500–7800)
Neutrophils Relative %: 42 %
Platelets: 272 10*3/uL (ref 140–400)
RBC: 5.29 MIL/uL — ABNORMAL HIGH (ref 3.80–5.10)
RDW: 14.7 % (ref 11.0–15.0)
WBC: 7.5 10*3/uL (ref 3.8–10.8)

## 2015-05-30 LAB — COMPLETE METABOLIC PANEL WITH GFR
ALT: 20 U/L (ref 6–29)
AST: 21 U/L (ref 10–35)
Albumin: 5.1 g/dL (ref 3.6–5.1)
Alkaline Phosphatase: 108 U/L (ref 33–130)
BUN: 8 mg/dL (ref 7–25)
CO2: 28 mmol/L (ref 20–31)
Calcium: 10.9 mg/dL — ABNORMAL HIGH (ref 8.6–10.4)
Chloride: 96 mmol/L — ABNORMAL LOW (ref 98–110)
Creat: 0.88 mg/dL (ref 0.50–1.05)
GFR, Est African American: 86 mL/min (ref >=60)
GFR, Est Non African American: 74 mL/min (ref >=60)
Glucose, Bld: 76 mg/dL (ref 65–99)
Potassium: 3.7 mmol/L (ref 3.5–5.3)
Sodium: 137 mmol/L (ref 135–146)
Total Bilirubin: 0.5 mg/dL (ref 0.2–1.2)
Total Protein: 8.4 g/dL — ABNORMAL HIGH (ref 6.1–8.1)

## 2015-05-30 LAB — POCT GLYCOSYLATED HEMOGLOBIN (HGB A1C): Hemoglobin A1C: 6.7

## 2015-05-30 LAB — GLUCOSE, POCT (MANUAL RESULT ENTRY): POC Glucose: 101 mg/dl — AB (ref 70–99)

## 2015-05-30 MED ORDER — COLCHICINE 0.6 MG PO TABS: 0.6000 mg | ORAL_TABLET | Freq: Every day | ORAL | Status: DC

## 2015-05-30 MED ORDER — ACETAMINOPHEN-CODEINE #3 300-30 MG PO TABS: 1.0000 | ORAL_TABLET | ORAL | Status: DC | PRN

## 2015-05-30 MED ORDER — BUDESONIDE-FORMOTEROL FUMARATE 80-4.5 MCG/ACT IN AERO: 2.0000 | INHALATION_SPRAY | Freq: Two times a day (BID) | RESPIRATORY_TRACT | Status: DC

## 2015-05-30 MED ORDER — ALLOPURINOL 100 MG PO TABS: 100.0000 mg | ORAL_TABLET | Freq: Every day | ORAL | Status: DC

## 2015-05-30 NOTE — Progress Notes (Signed)
[facility] 05/30/2015 01/04/2015 11/29/2014 11/14/2014 10/11/2014  Decreased Interest 2 0 0 1 0  Down, Depressed, Hopeless 0 0 0 1 0  PHQ - 2 Score 2 0 0 2 0  Altered sleeping 0 - - - -  Tired, decreased energy 2 - - - -  Change in appetite 2 - - - -  Feeling bad or failure about yourself  0 - - - -  Trouble concentrating 0 - - - -  Moving slowly or fidgety/restless 0 - - - -  Suicidal thoughts 0 - - - -  PHQ-9 Score 6 - - - -    GAD 7 : Generalized Anxiety Score 05/30/2015  Control/stop worrying 0  Worry too much - different things 0  Trouble relaxing 2  Restless 2  Easily annoyed or irritable 0  Afraid - awful might happen 0

## 2015-05-30 NOTE — Patient Instructions (Signed)
Hypertension Hypertension, commonly called high blood pressure, is when the force of blood pumping through your arteries is too strong. Your arteries are the blood vessels that carry blood from your heart throughout your body. A blood pressure reading consists of a higher number over a lower number, such as 110/72. The higher number (systolic) is the pressure inside your arteries when your heart pumps. The lower number (diastolic) is the pressure inside your arteries when your heart relaxes. Ideally you want your blood pressure below 120/80. Hypertension forces your heart to work harder to pump blood. Your arteries may become narrow or stiff. Having untreated or uncontrolled hypertension can cause heart attack, stroke, kidney disease, and other problems. RISK FACTORS Some risk factors for high blood pressure are controllable. Others are not.  Risk factors you cannot control include:   Race. You may be at higher risk if you are African American.  Age. Risk increases with age.  Gender. Men are at higher risk than women before age 45 years. After age 65, women are at higher risk than men. Risk factors you can control include:  Not getting enough exercise or physical activity.  Being overweight.  Getting too much fat, sugar, calories, or salt in your diet.  Drinking too much alcohol. SIGNS AND SYMPTOMS Hypertension does not usually cause signs or symptoms. Extremely high blood pressure (hypertensive crisis) may cause headache, anxiety, shortness of breath, and nosebleed. DIAGNOSIS To check if you have hypertension, your health care provider will measure your blood pressure while you are seated, with your arm held at the level of your heart. It should be measured at least twice using the same arm. Certain conditions can cause a difference in blood pressure between your right and left arms. A blood pressure reading that is higher than normal on one occasion does not mean that you need treatment. If  it is not clear whether you have high blood pressure, you may be asked to return on a different day to have your blood pressure checked again. Or, you may be asked to monitor your blood pressure at home for 1 or more weeks. TREATMENT Treating high blood pressure includes making lifestyle changes and possibly taking medicine. Living a healthy lifestyle can help lower high blood pressure. You may need to change some of your habits. Lifestyle changes may include:  Following the DASH diet. This diet is high in fruits, vegetables, and whole grains. It is low in salt, red meat, and added sugars.  Keep your sodium intake below 2,300 mg per day.  Getting at least 30-45 minutes of aerobic exercise at least 4 times per week.  Losing weight if necessary.  Not smoking.  Limiting alcoholic beverages.  Learning ways to reduce stress. Your health care provider may prescribe medicine if lifestyle changes are not enough to get your blood pressure under control, and if one of the following is true:  You are 18-59 years of age and your systolic blood pressure is above 140.  You are 60 years of age or older, and your systolic blood pressure is above 150.  Your diastolic blood pressure is above 90.  You have diabetes, and your systolic blood pressure is over 140 or your diastolic blood pressure is over 90.  You have kidney disease and your blood pressure is above 140/90.  You have heart disease and your blood pressure is above 140/90. Your personal target blood pressure may vary depending on your medical conditions, your age, and other factors. HOME CARE INSTRUCTIONS    Have your blood pressure rechecked as directed by your health care provider.   Take medicines only as directed by your health care provider. Follow the directions carefully. Blood pressure medicines must be taken as prescribed. The medicine does not work as well when you skip doses. Skipping doses also puts you at risk for  problems.  Do not smoke.   Monitor your blood pressure at home as directed by your health care provider. SEEK MEDICAL CARE IF:   You think you are having a reaction to medicines taken.  You have recurrent headaches or feel dizzy.  You have swelling in your ankles.  You have trouble with your vision. SEEK IMMEDIATE MEDICAL CARE IF:  You develop a severe headache or confusion.  You have unusual weakness, numbness, or feel faint.  You have severe chest or abdominal pain.  You vomit repeatedly.  You have trouble breathing. MAKE SURE YOU:   Understand these instructions.  Will watch your condition.  Will get help right away if you are not doing well or get worse.   This information is not intended to replace advice given to you by your health care provider. Make sure you discuss any questions you have with your health care provider.   Document Released: 01/19/2005 Document Revised: 06/05/2014 Document Reviewed: 11/11/2012 Elsevier Interactive Patient Education 2016 [employer] DASH Eating Plan DASH stands for "Dietary Approaches to Stop Hypertension." The DASH eating plan is a healthy eating plan that has been shown to reduce high blood pressure (hypertension). Additional health benefits may include reducing the risk of type 2 diabetes mellitus, heart disease, and stroke. The DASH eating plan may also help with weight loss. WHAT DO I NEED TO KNOW ABOUT THE DASH EATING PLAN? For the DASH eating plan, you will follow these general guidelines:  Choose foods with a percent daily value for sodium of less than 5% (as listed on the food label).  Use salt-free seasonings or herbs instead of table salt or sea salt.  Check with your health care provider or pharmacist before using salt substitutes.  Eat lower-sodium products, often labeled as "lower sodium" or "no salt added."  Eat fresh foods.  Eat more vegetables, fruits, and low-fat dairy products.  Choose whole grains.  Look for the word "whole" as the first word in the ingredient list.  Choose fish and skinless chicken or turkey more often than red meat. Limit fish, poultry, and meat to 6 oz (170 g) each day.  Limit sweets, desserts, sugars, and sugary drinks.  Choose heart-healthy fats.  Limit cheese to 1 oz (28 g) per day.  Eat more home-cooked food and less restaurant, buffet, and fast food.  Limit fried foods.  Cook foods using methods other than frying.  Limit canned vegetables. If you do use them, rinse them well to decrease the sodium.  When eating at a restaurant, ask that your food be prepared with less salt, or no salt if possible. WHAT FOODS CAN I EAT? Seek help from a dietitian for individual calorie needs. Grains Whole grain or whole wheat bread. Brown rice. Whole grain or whole wheat pasta. Quinoa, bulgur, and whole grain cereals. Low-sodium cereals. Corn or whole wheat flour tortillas. Whole grain cornbread. Whole grain crackers. Low-sodium crackers. Vegetables Fresh or frozen vegetables (raw, steamed, roasted, or grilled). Low-sodium or reduced-sodium tomato and vegetable juices. Low-sodium or reduced-sodium tomato sauce and paste. Low-sodium or reduced-sodium canned vegetables.  Fruits All fresh, canned (in natural juice), or frozen fruits. Meat and Other   Protein Products Ground beef (85% or leaner), grass-fed beef, or beef trimmed of fat. Skinless chicken or turkey. Ground chicken or turkey. Pork trimmed of fat. All fish and seafood. Eggs. Dried beans, peas, or lentils. Unsalted nuts and seeds. Unsalted canned beans. Dairy Low-fat dairy products, such as skim or 1% milk, 2% or reduced-fat cheeses, low-fat ricotta or cottage cheese, or plain low-fat yogurt. Low-sodium or reduced-sodium cheeses. Fats and Oils Tub margarines without trans fats. Light or reduced-fat mayonnaise and salad dressings (reduced sodium). Avocado. Safflower, olive, or canola oils. Natural peanut or almond  butter. Other Unsalted popcorn and pretzels. The items listed above may not be a complete list of recommended foods or beverages. Contact your dietitian for more options. WHAT FOODS ARE NOT RECOMMENDED? Grains White bread. White pasta. White rice. Refined cornbread. Bagels and croissants. Crackers that contain trans fat. Vegetables Creamed or fried vegetables. Vegetables in a cheese sauce. Regular canned vegetables. Regular canned tomato sauce and paste. Regular tomato and vegetable juices. Fruits Dried fruits. Canned fruit in light or heavy syrup. Fruit juice. Meat and Other Protein Products Fatty cuts of meat. Ribs, chicken wings, bacon, sausage, bologna, salami, chitterlings, fatback, hot dogs, bratwurst, and packaged luncheon meats. Salted nuts and seeds. Canned beans with salt. Dairy Whole or 2% milk, cream, half-and-half, and cream cheese. Whole-fat or sweetened yogurt. Full-fat cheeses or blue cheese. Nondairy creamers and whipped toppings. Processed cheese, cheese spreads, or cheese curds. Condiments Onion and garlic salt, seasoned salt, table salt, and sea salt. Canned and packaged gravies. Worcestershire sauce. Tartar sauce. Barbecue sauce. Teriyaki sauce. Soy sauce, including reduced sodium. Steak sauce. Fish sauce. Oyster sauce. Cocktail sauce. Horseradish. Ketchup and mustard. Meat flavorings and tenderizers. Bouillon cubes. Hot sauce. Tabasco sauce. Marinades. Taco seasonings. Relishes. Fats and Oils Butter, stick margarine, lard, shortening, ghee, and bacon fat. Coconut, palm kernel, or palm oils. Regular salad dressings. Other Pickles and olives. Salted popcorn and pretzels. The items listed above may not be a complete list of foods and beverages to avoid. Contact your dietitian for more information. WHERE CAN I FIND MORE INFORMATION? National [employer], [employer], [employer]: www.nhlbi.nih.gov/health/health-topics/topics/dash/   This information is not intended to replace  advice given to you by your health care provider. Make sure you discuss any questions you have with your health care provider.   Document Released: 01/08/2011 Document Revised: 02/09/2014 Document Reviewed: 11/23/2012 Elsevier Interactive Patient Education 2016 [employer] Diabetes and Exercise Exercising regularly is important. It is not just about losing weight. It has many health benefits, such as:  Improving your overall fitness, flexibility, and endurance.  Increasing your bone density.  Helping with weight control.  Decreasing your body fat.  Increasing your muscle strength.  Reducing stress and tension.  Improving your overall health. People with diabetes who exercise gain additional benefits because exercise:  Reduces appetite.  Improves the body's use of blood sugar (glucose).  Helps lower or control blood glucose.  Decreases blood pressure.  Helps control blood lipids (such as cholesterol and triglycerides).  Improves the body's use of the hormone insulin by:  Increasing the body's insulin sensitivity.  Reducing the body's insulin needs.  Decreases the risk for heart disease because exercising:  Lowers cholesterol and triglycerides levels.  Increases the levels of good cholesterol (such as high-density lipoproteins [HDL]) in the body.  Lowers blood glucose levels. YOUR ACTIVITY PLAN  Choose an activity that you enjoy, and set realistic goals. To exercise safely, you should begin practicing any new physical activity   slowly, and gradually increase the intensity of the exercise over time. Your health care provider or diabetes educator can help create an activity plan that works for you. General recommendations include:  Encouraging children to engage in at least 60 minutes of physical activity each day.  Stretching and performing strength training exercises, such as yoga or weight lifting, at least 2 times per week.  Performing a total of at least 150  minutes of moderate-intensity exercise each week, such as brisk walking or water aerobics.  Exercising at least 3 days per week, making sure you allow no more than 2 consecutive days to pass without exercising.  Avoiding long periods of inactivity (90 minutes or more). When you have to spend an extended period of time sitting down, take frequent breaks to walk or stretch. RECOMMENDATIONS FOR EXERCISING WITH TYPE 1 OR TYPE 2 DIABETES   Check your blood glucose before exercising. If blood glucose levels are greater than 240 mg/dL, check for urine ketones. Do not exercise if ketones are present.  Avoid injecting insulin into areas of the body that are going to be exercised. For example, avoid injecting insulin into:  The arms when playing tennis.  The legs when jogging.  Keep a record of:  Food intake before and after you exercise.  Expected peak times of insulin action.  Blood glucose levels before and after you exercise.  The type and amount of exercise you have done.  Review your records with your health care provider. Your health care provider will help you to develop guidelines for adjusting food intake and insulin amounts before and after exercising.  If you take insulin or oral hypoglycemic agents, watch for signs and symptoms of hypoglycemia. They include:  Dizziness.  Shaking.  Sweating.  Chills.  Confusion.  Drink plenty of water while you exercise to prevent dehydration or heat stroke. Body water is lost during exercise and must be replaced.  Talk to your health care provider before starting an exercise program to make sure it is safe for you. Remember, almost any type of activity is better than none.   This information is not intended to replace advice given to you by your health care provider. Make sure you discuss any questions you have with your health care provider.   Document Released: 04/11/2003 Document Revised: 06/05/2014 Document Reviewed:  06/28/2012 Elsevier Interactive Patient Education 2016 [employer] Basic Carbohydrate Counting for Diabetes Mellitus Carbohydrate counting is a method for keeping track of the amount of carbohydrates you eat. Eating carbohydrates naturally increases the level of sugar (glucose) in your blood, so it is important for you to know the amount that is okay for you to have in every meal. Carbohydrate counting helps keep the level of glucose in your blood within normal limits. The amount of carbohydrates allowed is different for every person. A dietitian can help you calculate the amount that is right for you. Once you know the amount of carbohydrates you can have, you can count the carbohydrates in the foods you want to eat. Carbohydrates are found in the following foods:  Grains, such as breads and cereals.  Dried beans and soy products.  Starchy vegetables, such as potatoes, peas, and corn.  Fruit and fruit juices.  Milk and yogurt.  Sweets and snack foods, such as cake, cookies, candy, chips, soft drinks, and fruit drinks. CARBOHYDRATE COUNTING There are two ways to count the carbohydrates in your food. You can use either of the methods or a combination of both.   Reading the "Nutrition Facts" on Packaged Food The "Nutrition Facts" is an area that is included on the labels of almost all packaged food and beverages in the [place]. It includes the serving size of that food or beverage and information about the nutrients in each serving of the food, including the grams (g) of carbohydrate per serving.  Decide the number of servings of this food or beverage that you will be able to eat or drink. Multiply that number of servings by the number of grams of carbohydrate that is listed on the label for that serving. The total will be the amount of carbohydrates you will be having when you eat or drink this food or beverage. Learning Standard Serving Sizes of Food When you eat food that is not  packaged or does not include "Nutrition Facts" on the label, you need to measure the servings in order to count the amount of carbohydrates.A serving of most carbohydrate-rich foods contains about 15 g of carbohydrates. The following list includes serving sizes of carbohydrate-rich foods that provide 15 g ofcarbohydrate per serving:   1 slice of bread (1 oz) or 1 six-inch tortilla.    of a hamburger bun or English muffin.  4-6 crackers.   cup unsweetened dry cereal.    cup hot cereal.   cup rice or pasta.    cup mashed potatoes or  of a large baked potato.  1 cup fresh fruit or one small piece of fruit.    cup canned or frozen fruit or fruit juice.  1 cup milk.   cup plain fat-free yogurt or yogurt sweetened with artificial sweeteners.   cup cooked dried beans or starchy vegetable, such as peas, corn, or potatoes.  Decide the number of standard-size servings that you will eat. Multiply that number of servings by 15 (the grams of carbohydrates in that serving). For example, if you eat 2 cups of strawberries, you will have eaten 2 servings and 30 g of carbohydrates (2 servings x 15 g = 30 g). For foods such as soups and casseroles, in which more than one food is mixed in, you will need to count the carbohydrates in each food that is included. EXAMPLE OF CARBOHYDRATE COUNTING Sample Dinner  3 oz chicken breast.   cup of brown rice.   cup of corn.  1 cup milk.   1 cup strawberries with sugar-free whipped topping.  Carbohydrate Calculation Step 1: Identify the foods that contain carbohydrates:   Rice.   Corn.   Milk.   Strawberries. Step 2:Calculate the number of servings eaten of each:   2 servings of rice.   1 serving of corn.   1 serving of milk.   1 serving of strawberries. Step 3: Multiply each of those number of servings by 15 g:   2 servings of rice x 15 g = 30 g.   1 serving of corn x 15 g = 15 g.   1 serving of milk x 15  g = 15 g.   1 serving of strawberries x 15 g = 15 g. Step 4: Add together all of the amounts to find the total grams of carbohydrates eaten: 30 g + 15 g + 15 g + 15 g = 75 g.   This information is not intended to replace advice given to you by your health care provider. Make sure you discuss any questions you have with your health care provider.   Document Released: 01/19/2005 Document Revised: 02/09/2014 Document Reviewed:   12/16/2012 Elsevier Interactive Patient Education 2016 [employer]  

## 2015-05-30 NOTE — Progress Notes (Signed)
Patient ID: Stacy [name], female   DOB: Jun 25, 1960, 55 y.o.   MRN: [id number]   Stacy [name], is a 55 y.o. female  [id number]  [id number]  DOB - Jun 09, 1960  Chief Complaint  Patient presents with  . Follow-up    DM        Subjective:   Stacy [name] is a 55 y.o. 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 She has no new complaints today. She is being scheduled for back surgery by the orthopedics surgeon, she needs medical clearance for surgery. Her knee pain as continuous but much better with the braces. She still ambulates with walker. No history of fall. She continues to smoke about half a pack of cigarettes per day. She is planning to quit. She needs refills on her gout medications. No feeling of currently. Patient has No headache, No chest pain, No abdominal pain - No Nausea, No new weakness tingling or numbness, No Cough - SOB.  No problems updated.  ALLERGIES: Allergies  Allergen Reactions  . Other Shortness Of Breath    Allergic to perfumes and cleaning products  . Aspirin Nausea Only    unknown  . Ace Inhibitors Other (See Comments)    Cough, wheeze    PAST MEDICAL HISTORY: Past Medical History  Diagnosis Date  . Hypertension     Pt on lisinopril  . GERD (gastroesophageal reflux disease)     Pt on Protonix daily  . Arthritis     Takes Naprosyn   . Diabetes mellitus     Takes Metformin 750 mg  . Hyperlipidemia   . Asthma   . COPD (chronic obstructive pulmonary disease) (HCC)   . Shortness of breath     occasional - uses breathing tx at home  . Headache(784.0)     otc meds prn  . Irritable bowel syndrome 11/19/2010    MEDICATIONS AT HOME: Prior to Admission medications   Medication Sig Start Date End Date Taking? Authorizing  Provider  acetaminophen-codeine (TYLENOL #3) 300-30 MG tablet Take 1 tablet by mouth every 4 (four) hours as needed. 05/30/15  Yes Quentin Angst, MD  albuterol (PROVENTIL) (2.5 MG/3ML) 0.083% nebulizer solution Take 3 mLs (2.5 mg total) by nebulization every 4 (four) hours as needed for wheezing. 04/25/15  Yes Quentin Angst, MD  allopurinol (ZYLOPRIM) 100 MG tablet Take 1 tablet (100 mg total) by mouth daily. 05/30/15  Yes Quentin Angst, MD  Blood Glucose Monitoring Suppl (TRUE METRIX METER) DEVI 1 Device by Does not apply route 4 (four) times daily -  before meals and at bedtime. 11/14/14  Yes Jaclyn Shaggy, MD  budesonide-formoterol (SYMBICORT) 80-4.5 MCG/ACT inhaler Inhale 2 puffs into the lungs 2 (two) times daily. 05/30/15 05/29/16 Yes Lavonne Kinderman Annitta Needs, MD  colchicine 0.6 MG tablet Take 1 tablet (0.6 mg total) by mouth daily. 05/30/15  Yes Quentin Angst, MD  cyclobenzaprine (FLEXERIL) 5 MG tablet Take 1 tablet (5 mg total) by mouth 3 (three) times daily as needed for muscle spasms. 02/21/15  Yes Quentin Angst, MD  gabapentin (NEURONTIN) 300 MG capsule Take 1 capsule (300 mg total) by mouth 3 (three) times daily. 02/21/15  Yes Quentin Angst, MD  glucose blood (ACCU-CHEK AVIVA) test strip Use as instructed 02/22/15  Yes Manie Bealer Annitta Needs, MD  glucose blood (  TRUE METRIX BLOOD GLUCOSE TEST) test strip USE AS DIRECTED BY PHYSICIAN 07/31/14  Yes Quentin Angstlugbemiga E Jahmani Staup, MD  hydrochlorothiazide (HYDRODIURIL) 25 MG tablet Take 1 tablet (25 mg total) by mouth daily. 04/25/15  Yes Quentin Angstlugbemiga E Trinitie Mcgirr, MD  hydrocortisone-pramoxine [facility](ANALPRAM-HC) 2.5-1 % rectal cream Place 1 application rectally 2 (two) times daily. 11/16/14  Yes Jaclyn ShaggyEnobong Amao, MD  hydrOXYzine (ATARAX/VISTARIL) 10 MG tablet Take 1 tablet (10 mg total) by mouth 3 (three) times daily as needed. 02/21/15  Yes Quentin Angstlugbemiga E Gaelle Adriance, MD  Lancets (ACCU-CHEK SOFT TOUCH) lancets Use as instructed 02/22/15  Yes Quentin Angstlugbemiga E Damita Eppard,  MD  metFORMIN (GLUCOPHAGE-XR) 750 MG 24 hr tablet Take 1 tablet (750 mg total) by mouth daily with breakfast. 02/21/15  Yes Quentin Angstlugbemiga E Kelse Ploch, MD  pantoprazole (PROTONIX) 40 MG tablet Take 1 tablet (40 mg total) by mouth daily. 02/21/15  Yes Quentin Angstlugbemiga E Evee Liska, MD  simvastatin (ZOCOR) 20 MG tablet Take 1 tablet (20 mg total) by mouth at bedtime. 02/21/15  Yes Quentin Angstlugbemiga E Ollis Daudelin, MD  Vitamin D, Ergocalciferol, (DRISDOL) 50000 UNITS CAPS capsule Take 1 capsule (50,000 Units total) by mouth every 7 (seven) weeks. Sundays 10/11/14  Yes Quentin Angstlugbemiga E Ameen Mostafa, MD  EPINEPHrine 0.3 mg/0.3 mL IJ SOAJ injection Inject 0.3 mLs (0.3 mg total) into the muscle once. Patient not taking: Reported on 05/30/2015 09/10/14   Quentin Angstlugbemiga E Dominyck Reser, MD  nicotine (NICODERM CQ - DOSED IN MG/24 HOURS) 21 mg/24hr patch Place 1 patch (21 mg total) onto the skin daily. Patient not taking: Reported on 05/30/2015 10/11/14   Quentin Angstlugbemiga E Deane Wattenbarger, MD     Objective:   Filed Vitals:   05/30/15 0919  BP: 128/86  Pulse: 78  Temp: 98 F (36.7 C)  TempSrc: Oral  Resp: 18  Height: 5\' 6"  (1.676 m)  Weight: 156 lb (70.761 kg)  SpO2: 98%    Exam General appearance : Awake, alert, not in any distress. Speech Clear. Not toxic looking HEENT: Atraumatic and Normocephalic, pupils equally reactive to light and accomodation Neck: supple, no JVD. No cervical lymphadenopathy.  Chest:Good air entry bilaterally, no added sounds  CVS: S1 S2 regular, no murmurs.  Abdomen: Bowel sounds present, Non tender and not distended with no gaurding, rigidity or rebound. Extremities: Left knee brace in situ. B/L Lower Ext shows no edema, both legs are warm to touch Neurology: Awake alert, and oriented X 3, CN II-XII intact, Non focal Skin: No Rash  Data Review Lab Results  Component Value Date   HGBA1C 6.7 05/30/2015   HGBA1C 6.30 02/21/2015   HGBA1C 6.40 10/11/2014     Assessment & Plan   1. Type 2 diabetes mellitus without complication,  without long-term current use of insulin (HCC)  - POCT A1C is 6.7% today - Glucose (CBG)  Aim for 30 minutes of exercise most days. Rethink what you drink. Water is great! Aim for 2-3 Carb Choices per meal (30-45 grams) +/- 1 either way  Aim for 0-15 Carbs per snack if hungry  Include protein in moderation with your meals and snacks  Consider reading food labels for Total Carbohydrate and Fat Grams of foods  Consider checking BG at alternate times per day  Continue taking medication as directed Be mindful about how much sugar you are adding to beverages and other foods. Fruit Punch - find one with no sugar  Measure and decrease portions of carbohydrate foods  Make your plate and don't go back for seconds  2. Bilateral low back pain without sciatica -  acetaminophen-codeine (TYLENOL #3) 300-30 MG tablet; Take 1 tablet by mouth every 4 (four) hours as needed.  Dispense: 90 tablet; Refill: 0 Patient has no medical contraindication to the planned back surgery  3. Tobacco use disorder  Shamir was counseled on the dangers of tobacco use, and was advised to quit. Reviewed strategies to maximize success, including removing cigarettes and smoking materials from environment, stress management and support of family/friends.  4. Essential hypertension  We have discussed target BP range and blood pressure goal. I have advised patient to check BP regularly and to call us back or report to clinic if the numbers are consistently higher than 140/90. We discussed the importance of compliance with medical therapy and DASH diet recommended, consequences of uncontrolled hypertension discussed.   - continue current BP medications  5. Gout of big toe  - allopurinol (ZYLOPRIM) 100 MG tablet; Take 1 tablet (100 mg total) by mouth daily.  Dispense: 90 tablet; Refill: 3 - colchicine 0.6 MG tablet; Take 1 tablet (0.6 mg total) by mouth daily.  Dispense: 90 tablet; Refill: 3  6. Asthma, chronic, mild  persistent, uncomplicated  - budesonide-formoterol (SYMBICORT) 80-4.5 MCG/ACT inhaler; Inhale 2 puffs into the lungs 2 (two) times daily.  Dispense: 3 Inhaler; Refill: 3  Patient have been counseled extensively about nutrition and exercise  Return in about 3 months (around 08/29/2015) for Hemoglobin A1C and Follow up, DM, Follow up HTN, Follow up Pain and comorbidities.  The patient was given clear instructions to go to ER or return to medical center if symptoms don't improve, worsen or new problems develop. The patient verbalized understanding. The patient was told to call to get lab results if they haven't heard anything in the next week.   This note has been created with Education officer, environmental. Any transcriptional errors are unintentional.    Jeanann Lewandowsky, MD, MHA, Maxwell Caul, CPE [facility] [place], [place] [phone]   05/30/2015, 9:50 AM

## 2015-05-30 NOTE — Progress Notes (Signed)
Patient is here for FU + GOUT  Patient complains of gout being present in both big toes. Patient is requesting a refill on gout medication.  Patient has taken morning medication. Patient has not eaten today.  Patient complains of chronic back pain being present currently scaled at a 9. Pain is described as throbbing and sharp pain. Patient has seen orthopedic surgeon and is scheduled for surgery upon clearance from PCP.

## 2015-05-31 ENCOUNTER — Encounter (HOSPITAL_COMMUNITY): Payer: Self-pay | Admitting: *Deleted

## 2015-05-31 ENCOUNTER — Encounter: Payer: Self-pay | Admitting: Pharmacist

## 2015-05-31 NOTE — Progress Notes (Signed)
I instructed patient to check CBG to check CBG and if it is less than 70 to treat it with Glucose Gel, Glucose tablets or 1/2 cup of clear juice like apple juice or cranberry juice, or 1/2 cup of regular soda. (not cream soda). I instructed patient to recheck CBG in 15 minutes and if CBG is not greater than 70, to  Call [phone] (pre- op). If it is before pre-op opens to retreat as before and recheck CBG in 15 minutes. I told patient to make note of time that liquid is taken and amount, that surgical time may have to be adjusted.  

## 2015-05-31 NOTE — Progress Notes (Signed)
Symbicort Prior Authorization completed and approved by Medicaid  Approval #: [id number]

## 2015-06-02 ENCOUNTER — Encounter (HOSPITAL_COMMUNITY): Payer: Self-pay | Admitting: Anesthesiology

## 2015-06-02 MED ORDER — CEFAZOLIN SODIUM-DEXTROSE 2-4 GM/100ML-% IV SOLN
2.0000 g | INTRAVENOUS | Status: AC
  Administered 2015-06-03: 2 g via INTRAVENOUS
  Filled 2015-06-02: qty 100

## 2015-06-02 NOTE — H&P (Signed)
Stacy [name] is an 55 y.o. female.    Patient is a 55 year old female, she has been followed for complaints of bilateral knee discomfort and low back pain.  She has been experiencing ongoing difficulties with her knees tending to give way on her.  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  She was originally seen at the sports medicine clinic and referred by Dr. Jennette KettleNeal after having had a fall and worsening of her knee condition.  We have discussed with her concerns regarding her knee.  Her MRI scans have shown significant osteoarthritis of the patellofemoral joint, the right knee as well as left.  She has significant problems with intrinsic knee pain, but also has EMG and nerve conduction studies that suggest that there is an element of neurogenic weakness and neurogenic change that is occurring in the left leg greater than the right.  These changes suggest by EMG and nerve conduction study the L3 and L4 radiculopathy on the right side that is chronic.  MRI scan showed disk bulge at the L4-5 level.  That MRI scan done 10/02/2014 showed chronic disk and end plate degeneration at L4-5 with a trace of superior L5 end plate edema.  There is narrowing of the lateral recesses associated with L4-5, mild spinal bilateral and lateral recess stenosis.  At L3-4 moderate facet and ligamentum hypertrophy also, mild epidural lipomatosis and moderate spinal narrowing that is stable, mild right L3 foraminal narrowing that was also stable.  At L2-3 mild disk bulge, mild ligament facet hypertrophy with chronic facet joint fluid, stable mild spinal and bilateral L2 foraminal narrowing.  At L5-S1 disk bulge without significant spinal stenosis, mild joint hypertrophy borderline, mild right lateral recess narrowing.  There is triangulation of the thecal sac at both L3-4 and L4-5.  Mild triangulation of the thecal sac at L2-3 so that if the concern is L3 and L4 chronic radiculopathy the findings may relate to  either L2-3, L3-4 or L4-5.  With that in mind, I think she has had an adequate prolonged period of conservative management and still having ongoing symptoms in the lower extremities.  With problems of patellofemoral degenerative changes normally a strengthening program of the quadriceps would be indicated and she has done therapy and completed therapy, still having problems with chronic leg pain and chronic knee pain, right side greater than left.  She is scheduled to see Dr. Worthy Rancheron Gioffre in regards to her right knee where she has rather severe degenerative joint changes of the right patellofemoral joint.  She has MRI evidence of previous edema within the lateral femoral condyle with positive grind test, severe pain with range of motion of the right knee.  There is definitely an intrinsic problem with her knee, but also she has the findings on her EMGs and nerve conduction studies.    Past Medical History  Diagnosis Date  . Hypertension     Pt on lisinopril  . GERD (gastroesophageal reflux disease)     Pt on Protonix daily  . Hyperlipidemia   . Asthma   . COPD (chronic obstructive pulmonary disease) (HCC)   . Shortness of breath     occasional - uses breathing tx at home  . Headache(784.0)     otc meds prn  . Irritable bowel syndrome 11/19/2010  . Diabetes mellitus     Type II  . Neuropathy (HCC)   . Arthritis   . Gout     Past Surgical History  Procedure Laterality Date  .  Cholecystectomy    . Svd       x 2  . Tubal ligation    . Endometrial ablation  10/2010  . Upper gastrointestinal endoscopy  04/28/11  . Hernia repair      umbicial hernia    Family History  Problem Relation Age of Onset  . Asthma Son     had as a child  . Heart disease Sister   . Breast cancer Sister   . Hypertension Father   . Cancer Father   . Hypertension Brother    Social History:  reports that she has been smoking Cigarettes.  She has a 16 pack-year smoking history. She has never used smokeless  tobacco. She reports that she does not drink alcohol or use illicit drugs.  Allergies:  Allergies  Allergen Reactions  . Other Shortness Of Breath    Allergic to perfumes and cleaning products  . Aspirin Nausea Only    unknown  . Ace Inhibitors Other (See Comments)    Cough, wheeze    No prescriptions prior to admission    No results found for this or any previous visit (from the past 48 hour(s)). No results found.  Review of Systems  Constitutional: Negative.   HENT: Negative.   Eyes: Negative.   Respiratory: Negative.   Cardiovascular: Negative.   Gastrointestinal: Negative.   Genitourinary: Negative.   Musculoskeletal: Positive for back pain and joint pain.  Skin: Negative.   Psychiatric/Behavioral: Negative.     There were no vitals taken for this visit. Physical Exam  Constitutional: She is oriented to person, place, and time. No distress.  HENT:  Head: Atraumatic.  Eyes: EOM are normal.  Neck: Normal range of motion.  Cardiovascular: Normal rate.   Respiratory: No respiratory distress.  GI: She exhibits no distension.  Musculoskeletal: She exhibits tenderness.  Neurological: She is alert and oriented to person, place, and time.  Skin: Skin is warm and dry.  Psychiatric: She has a normal mood and affect.     Assessment:  L2-3, L3-4, L4-5 stenosis PLAN: Bilateral lateral recess decompression L2-3, L3-4, L4-5 I have discussed with Stacy [name] her condition.  The EMGs and nerve conduction studies are objective evidence that she does have a problem of a neurogenic source of weakness in her legs and with the mild lumbar spinal stenosis and lateral recess stenosis she may very well have ongoing nerve compression occurring at the L3 and L4 levels.  She is to see Dr. Darrelyn Hillock then sometime in May in regards to her knee, her husband having had previous total knee arthroplasty.  I think that at age 46 total joint replacement is much more likely to provide her with long-term  relief than a patellofemoral resurfacing or surgery directed just at the patellofemoral changes here.  I do, however, think that she has an ongoing lumbar condition and as her studies do suggest lateral recess stenosis, lateral recess decompression will be recommended.  I did discuss this with Stacy [name] today, the risks and benefits of undergoing injection treatment.  She has had conservative management for intrinsic knee problem.  She has had attempts at epidural steroid injections, as well as a therapy program directed at trying to improve her standing and walking tolerance, none of which has been very successful here.  Following the epidural steroid injection she had a spinal headache that was significant, requiring a blood patch.  As the patient has undergone a protracted course of conservative management in regards to her back and her  legs, I would recommend at this point that we consider a decompression with partial hemilaminectomies bilaterally at L2-3, L3-4 and L4-5 to try and effectively relieve any nerve compression affecting her L3 and L4 nerve roots.  With that then, one would consider the spinal condition addressed.  However, I would expect she would probably  have ongoing impairment associated with degenerative changes of the lumbar spine.  She does have significant Modic changes occurring at the L4-5 level.  Norco 7.5/325 mg 1-2 q.4-6h. p.r.n. pain renewed today. Naida Sleight, PA-C 06/02/2015, 5:42 PM   Patient examined and lab reviewed with Barry Dienes, PA-C.

## 2015-06-03 ENCOUNTER — Inpatient Hospital Stay (HOSPITAL_COMMUNITY)
Admission: RE | Admit: 2015-06-03 | Discharge: 2015-06-07 | DRG: 517 | Disposition: A | Discharge status: Home Health | Payer: Medicaid Other | Source: Ambulatory Visit | Attending: Specialist | Admitting: Specialist

## 2015-06-03 ENCOUNTER — Encounter (HOSPITAL_COMMUNITY)
Admission: RE | Disposition: A | Discharge status: Home Health | Payer: Self-pay | Source: Ambulatory Visit | Attending: Specialist

## 2015-06-03 ENCOUNTER — Inpatient Hospital Stay (HOSPITAL_COMMUNITY): Payer: Medicaid Other

## 2015-06-03 ENCOUNTER — Encounter (HOSPITAL_COMMUNITY): Payer: Self-pay | Admitting: General Practice

## 2015-06-03 ENCOUNTER — Inpatient Hospital Stay (HOSPITAL_COMMUNITY): Payer: Medicaid Other | Admitting: Anesthesiology

## 2015-06-03 DIAGNOSIS — Z888 Allergy status to other drugs, medicaments and biological substances status: Secondary | ICD-10-CM | POA: Diagnosis not present

## 2015-06-03 DIAGNOSIS — Z825 Family history of asthma and other chronic lower respiratory diseases: Secondary | ICD-10-CM

## 2015-06-03 DIAGNOSIS — Z419 Encounter for procedure for purposes other than remedying health state, unspecified: Secondary | ICD-10-CM

## 2015-06-03 DIAGNOSIS — F17210 Nicotine dependence, cigarettes, uncomplicated: Secondary | ICD-10-CM | POA: Diagnosis present

## 2015-06-03 DIAGNOSIS — Z803 Family history of malignant neoplasm of breast: Secondary | ICD-10-CM | POA: Diagnosis not present

## 2015-06-03 DIAGNOSIS — M4806 Spinal stenosis, lumbar region: Secondary | ICD-10-CM | POA: Diagnosis present

## 2015-06-03 DIAGNOSIS — E785 Hyperlipidemia, unspecified: Secondary | ICD-10-CM | POA: Diagnosis present

## 2015-06-03 DIAGNOSIS — Z8249 Family history of ischemic heart disease and other diseases of the circulatory system: Secondary | ICD-10-CM | POA: Diagnosis not present

## 2015-06-03 DIAGNOSIS — M62838 Other muscle spasm: Secondary | ICD-10-CM | POA: Diagnosis not present

## 2015-06-03 DIAGNOSIS — Z23 Encounter for immunization: Secondary | ICD-10-CM | POA: Diagnosis not present

## 2015-06-03 DIAGNOSIS — Z7984 Long term (current) use of oral hypoglycemic drugs: Secondary | ICD-10-CM

## 2015-06-03 DIAGNOSIS — K219 Gastro-esophageal reflux disease without esophagitis: Secondary | ICD-10-CM | POA: Diagnosis present

## 2015-06-03 DIAGNOSIS — I10 Essential (primary) hypertension: Secondary | ICD-10-CM | POA: Diagnosis present

## 2015-06-03 DIAGNOSIS — M179 Osteoarthritis of knee, unspecified: Secondary | ICD-10-CM | POA: Diagnosis present

## 2015-06-03 DIAGNOSIS — M109 Gout, unspecified: Secondary | ICD-10-CM | POA: Diagnosis present

## 2015-06-03 DIAGNOSIS — E1140 Type 2 diabetes mellitus with diabetic neuropathy, unspecified: Secondary | ICD-10-CM | POA: Diagnosis present

## 2015-06-03 DIAGNOSIS — M48062 Spinal stenosis, lumbar region with neurogenic claudication: Secondary | ICD-10-CM | POA: Diagnosis present

## 2015-06-03 DIAGNOSIS — Z91048 Other nonmedicinal substance allergy status: Secondary | ICD-10-CM | POA: Diagnosis not present

## 2015-06-03 DIAGNOSIS — Z01818 Encounter for other preprocedural examination: Secondary | ICD-10-CM

## 2015-06-03 DIAGNOSIS — M2242 Chondromalacia patellae, left knee: Secondary | ICD-10-CM | POA: Diagnosis present

## 2015-06-03 DIAGNOSIS — J449 Chronic obstructive pulmonary disease, unspecified: Secondary | ICD-10-CM | POA: Diagnosis present

## 2015-06-03 DIAGNOSIS — M2241 Chondromalacia patellae, right knee: Secondary | ICD-10-CM | POA: Diagnosis present

## 2015-06-03 HISTORY — PX: LUMBAR LAMINECTOMY/DECOMPRESSION MICRODISCECTOMY: SHX5026

## 2015-06-03 HISTORY — DX: Polyneuropathy, unspecified: G62.9

## 2015-06-03 HISTORY — DX: Gout, unspecified: M10.9

## 2015-06-03 HISTORY — PX: LUMBAR DISC SURGERY: SHX700

## 2015-06-03 LAB — CBC
HCT: 39.2 % (ref 36.0–46.0)
Hemoglobin: 13.3 g/dL (ref 12.0–15.0)
MCH: 27.7 pg (ref 26.0–34.0)
MCHC: 33.9 g/dL (ref 30.0–36.0)
MCV: 81.7 fL (ref 78.0–100.0)
Platelets: 237 10*3/uL (ref 150–400)
RBC: 4.8 MIL/uL (ref 3.87–5.11)
RDW: 13.1 % (ref 11.5–15.5)
WBC: 8.3 10*3/uL (ref 4.0–10.5)

## 2015-06-03 LAB — GLUCOSE, CAPILLARY
Glucose-Capillary: 117 mg/dL — ABNORMAL HIGH (ref 65–99)
Glucose-Capillary: 116 mg/dL — ABNORMAL HIGH (ref 65–99)
Glucose-Capillary: 102 mg/dL — ABNORMAL HIGH (ref 65–99)

## 2015-06-03 LAB — COMPREHENSIVE METABOLIC PANEL
ALT: 24 U/L (ref 14–54)
AST: 30 U/L (ref 15–41)
Albumin: 4.3 g/dL (ref 3.5–5.0)
Alkaline Phosphatase: 91 U/L (ref 38–126)
Anion gap: 11 (ref 5–15)
BUN: 9 mg/dL (ref 6–20)
CO2: 30 mmol/L (ref 22–32)
Calcium: 9.6 mg/dL (ref 8.9–10.3)
Chloride: 94 mmol/L — ABNORMAL LOW (ref 101–111)
Creatinine, Ser: 0.93 mg/dL (ref 0.44–1.00)
GFR calc Af Amer: >60 mL/min (ref >60)
GFR calc non Af Amer: >60 mL/min (ref >60)
Glucose, Bld: 122 mg/dL — ABNORMAL HIGH (ref 65–99)
Potassium: 3.1 mmol/L — ABNORMAL LOW (ref 3.5–5.1)
Sodium: 135 mmol/L (ref 135–145)
Total Bilirubin: 0.7 mg/dL (ref 0.3–1.2)
Total Protein: 7.6 g/dL (ref 6.5–8.1)

## 2015-06-03 LAB — TYPE AND SCREEN
ABO/RH(D): O POS
Antibody Screen: NEGATIVE

## 2015-06-03 LAB — SURGICAL PCR SCREEN
MRSA, PCR: NEGATIVE
Staphylococcus aureus: NEGATIVE

## 2015-06-03 LAB — ABO/RH: ABO/RH(D): O POS

## 2015-06-03 SURGERY — LUMBAR LAMINECTOMY/DECOMPRESSION MICRODISCECTOMY
Anesthesia: General

## 2015-06-03 MED ORDER — FLEET ENEMA 7-19 GM/118ML RE ENEM: 1.0000 | ENEMA | Freq: Once | RECTAL | Status: DC | PRN

## 2015-06-03 MED ORDER — ALBUTEROL SULFATE (2.5 MG/3ML) 0.083% IN NEBU: 2.5000 mg | INHALATION_SOLUTION | RESPIRATORY_TRACT | Status: DC | PRN

## 2015-06-03 MED ORDER — ZOLPIDEM TARTRATE 5 MG PO TABS: 5.0000 mg | ORAL_TABLET | Freq: Every evening | ORAL | Status: DC | PRN

## 2015-06-03 MED ORDER — ROCURONIUM BROMIDE 100 MG/10ML IV SOLN
INTRAVENOUS | Status: DC | PRN
  Administered 2015-06-03: 10 mg via INTRAVENOUS
  Administered 2015-06-03: 40 mg via INTRAVENOUS

## 2015-06-03 MED ORDER — SODIUM CHLORIDE 0.9 % IV SOLN: 250.0000 mL | INTRAVENOUS | Status: DC

## 2015-06-03 MED ORDER — ONDANSETRON HCL 4 MG/2ML IJ SOLN
INTRAMUSCULAR | Status: AC
  Filled 2015-06-03: qty 2

## 2015-06-03 MED ORDER — CEFAZOLIN SODIUM 1-5 GM-% IV SOLN
1.0000 g | Freq: Three times a day (TID) | INTRAVENOUS | Status: AC
  Administered 2015-06-03 (×2): 1 g via INTRAVENOUS
  Filled 2015-06-03 (×3): qty 50

## 2015-06-03 MED ORDER — PHENYLEPHRINE 40 MCG/ML (10ML) SYRINGE FOR IV PUSH (FOR BLOOD PRESSURE SUPPORT)
PREFILLED_SYRINGE | INTRAVENOUS | Status: AC
  Filled 2015-06-03: qty 10

## 2015-06-03 MED ORDER — HYDROXYZINE HCL 10 MG PO TABS
10.0000 mg | ORAL_TABLET | Freq: Three times a day (TID) | ORAL | Status: DC | PRN
  Filled 2015-06-03: qty 1

## 2015-06-03 MED ORDER — SIMVASTATIN 20 MG PO TABS
20.0000 mg | ORAL_TABLET | Freq: Every day | ORAL | Status: DC
  Administered 2015-06-03 – 2015-06-06 (×4): 20 mg via ORAL
  Filled 2015-06-03 (×4): qty 1

## 2015-06-03 MED ORDER — PROPOFOL 10 MG/ML IV BOLUS
INTRAVENOUS | Status: DC | PRN
  Administered 2015-06-03: 125 mg via INTRAVENOUS

## 2015-06-03 MED ORDER — PHENYLEPHRINE HCL 10 MG/ML IJ SOLN
INTRAMUSCULAR | Status: DC | PRN
  Administered 2015-06-03 (×2): 80 ug via INTRAVENOUS

## 2015-06-03 MED ORDER — HYDROCHLOROTHIAZIDE 25 MG PO TABS
25.0000 mg | ORAL_TABLET | Freq: Every day | ORAL | Status: DC
  Administered 2015-06-03 – 2015-06-07 (×5): 25 mg via ORAL
  Filled 2015-06-03 (×5): qty 1

## 2015-06-03 MED ORDER — BUPIVACAINE HCL (PF) 0.5 % IJ SOLN
INTRAMUSCULAR | Status: AC
  Filled 2015-06-03: qty 30

## 2015-06-03 MED ORDER — HYDROMORPHONE HCL 1 MG/ML IJ SOLN
0.2500 mg | INTRAMUSCULAR | Status: DC | PRN
  Administered 2015-06-03: 0.5 mg via INTRAVENOUS

## 2015-06-03 MED ORDER — MUPIROCIN 2 % EX OINT
1.0000 "application " | TOPICAL_OINTMENT | Freq: Once | CUTANEOUS | Status: AC
  Administered 2015-06-03: 1 via TOPICAL

## 2015-06-03 MED ORDER — METHOCARBAMOL 1000 MG/10ML IJ SOLN
500.0000 mg | Freq: Four times a day (QID) | INTRAVENOUS | Status: DC | PRN
  Administered 2015-06-03: 500 mg via INTRAVENOUS
  Filled 2015-06-03 (×3): qty 5

## 2015-06-03 MED ORDER — MOMETASONE FURO-FORMOTEROL FUM 100-5 MCG/ACT IN AERO
2.0000 | INHALATION_SPRAY | Freq: Two times a day (BID) | RESPIRATORY_TRACT | Status: DC
  Administered 2015-06-03 – 2015-06-07 (×8): 2 via RESPIRATORY_TRACT
  Filled 2015-06-03 (×2): qty 8.8

## 2015-06-03 MED ORDER — THROMBIN 20000 UNITS EX KIT
PACK | CUTANEOUS | Status: DC | PRN
  Administered 2015-06-03: 20000 [IU] via TOPICAL

## 2015-06-03 MED ORDER — SODIUM CHLORIDE 0.9% FLUSH
3.0000 mL | Freq: Two times a day (BID) | INTRAVENOUS | Status: DC
  Administered 2015-06-04 – 2015-06-06 (×3): 3 mL via INTRAVENOUS

## 2015-06-03 MED ORDER — NEOSTIGMINE METHYLSULFATE 5 MG/5ML IV SOSY
PREFILLED_SYRINGE | INTRAVENOUS | Status: AC
  Filled 2015-06-03: qty 5

## 2015-06-03 MED ORDER — GABAPENTIN 300 MG PO CAPS
300.0000 mg | ORAL_CAPSULE | Freq: Three times a day (TID) | ORAL | Status: DC
  Administered 2015-06-03 – 2015-06-07 (×12): 300 mg via ORAL
  Filled 2015-06-03 (×13): qty 1

## 2015-06-03 MED ORDER — MORPHINE SULFATE (PF) 2 MG/ML IV SOLN
1.0000 mg | INTRAVENOUS | Status: DC | PRN
  Administered 2015-06-03 (×2): 2 mg via INTRAVENOUS
  Administered 2015-06-04: 4 mg via INTRAVENOUS
  Filled 2015-06-03: qty 1
  Filled 2015-06-03: qty 2
  Filled 2015-06-03: qty 1

## 2015-06-03 MED ORDER — ROCURONIUM BROMIDE 50 MG/5ML IV SOLN
INTRAVENOUS | Status: AC
  Filled 2015-06-03: qty 1

## 2015-06-03 MED ORDER — ACETAMINOPHEN 650 MG RE SUPP: 650.0000 mg | RECTAL | Status: DC | PRN

## 2015-06-03 MED ORDER — FENTANYL CITRATE (PF) 100 MCG/2ML IJ SOLN
INTRAMUSCULAR | Status: DC | PRN
  Administered 2015-06-03: 50 ug via INTRAVENOUS
  Administered 2015-06-03 (×2): 100 ug via INTRAVENOUS

## 2015-06-03 MED ORDER — PNEUMOCOCCAL VAC POLYVALENT 25 MCG/0.5ML IJ INJ
0.5000 mL | INJECTION | INTRAMUSCULAR | Status: AC
  Administered 2015-06-04: 0.5 mL via INTRAMUSCULAR
  Filled 2015-06-03: qty 0.5

## 2015-06-03 MED ORDER — ALLOPURINOL 100 MG PO TABS
100.0000 mg | ORAL_TABLET | Freq: Every day | ORAL | Status: DC
  Administered 2015-06-04 – 2015-06-07 (×4): 100 mg via ORAL
  Filled 2015-06-03 (×5): qty 1

## 2015-06-03 MED ORDER — ARTIFICIAL TEARS OP OINT
TOPICAL_OINTMENT | OPHTHALMIC | Status: AC
  Filled 2015-06-03: qty 3.5

## 2015-06-03 MED ORDER — INSULIN ASPART 100 UNIT/ML ~~LOC~~ SOLN
0.0000 [IU] | Freq: Three times a day (TID) | SUBCUTANEOUS | Status: DC
  Administered 2015-06-04 – 2015-06-05 (×3): 2 [IU] via SUBCUTANEOUS
  Administered 2015-06-05: 3 [IU] via SUBCUTANEOUS
  Administered 2015-06-06 – 2015-06-07 (×3): 2 [IU] via SUBCUTANEOUS

## 2015-06-03 MED ORDER — MUPIROCIN 2 % EX OINT
TOPICAL_OINTMENT | CUTANEOUS | Status: AC
  Filled 2015-06-03: qty 22

## 2015-06-03 MED ORDER — FENTANYL CITRATE (PF) 250 MCG/5ML IJ SOLN
INTRAMUSCULAR | Status: AC
  Filled 2015-06-03: qty 5

## 2015-06-03 MED ORDER — LIDOCAINE HCL (CARDIAC) 20 MG/ML IV SOLN
INTRAVENOUS | Status: DC | PRN
  Administered 2015-06-03: 100 mg via INTRAVENOUS

## 2015-06-03 MED ORDER — MENTHOL 3 MG MT LOZG: 1.0000 | LOZENGE | OROMUCOSAL | Status: DC | PRN

## 2015-06-03 MED ORDER — DOCUSATE SODIUM 100 MG PO CAPS
100.0000 mg | ORAL_CAPSULE | Freq: Two times a day (BID) | ORAL | Status: DC
  Administered 2015-06-03 – 2015-06-07 (×9): 100 mg via ORAL
  Filled 2015-06-03 (×9): qty 1

## 2015-06-03 MED ORDER — ALUM & MAG HYDROXIDE-SIMETH 200-200-20 MG/5ML PO SUSP: 30.0000 mL | Freq: Four times a day (QID) | ORAL | Status: DC | PRN

## 2015-06-03 MED ORDER — EPHEDRINE 5 MG/ML INJ
INTRAVENOUS | Status: AC
  Filled 2015-06-03: qty 10

## 2015-06-03 MED ORDER — ONDANSETRON HCL 4 MG/2ML IJ SOLN
INTRAMUSCULAR | Status: DC | PRN
  Administered 2015-06-03: 4 mg via INTRAVENOUS

## 2015-06-03 MED ORDER — SODIUM CHLORIDE 0.9 % IV SOLN
INTRAVENOUS | Status: DC
  Administered 2015-06-04 – 2015-06-05 (×3): via INTRAVENOUS

## 2015-06-03 MED ORDER — PROPOFOL 10 MG/ML IV BOLUS
INTRAVENOUS | Status: AC
  Filled 2015-06-03: qty 40

## 2015-06-03 MED ORDER — BISACODYL 5 MG PO TBEC: 5.0000 mg | DELAYED_RELEASE_TABLET | Freq: Every day | ORAL | Status: DC | PRN

## 2015-06-03 MED ORDER — NEOSTIGMINE METHYLSULFATE 10 MG/10ML IV SOLN
INTRAVENOUS | Status: DC | PRN
  Administered 2015-06-03: 4 mg via INTRAVENOUS

## 2015-06-03 MED ORDER — PHENOL 1.4 % MT LIQD
1.0000 | OROMUCOSAL | Status: DC | PRN
Start: 2015-06-03 — End: 2015-06-07

## 2015-06-03 MED ORDER — PANTOPRAZOLE SODIUM 40 MG PO TBEC
40.0000 mg | DELAYED_RELEASE_TABLET | Freq: Every day | ORAL | Status: DC
  Administered 2015-06-04 – 2015-06-07 (×4): 40 mg via ORAL
  Filled 2015-06-03 (×5): qty 1

## 2015-06-03 MED ORDER — LACTATED RINGERS IV SOLN
INTRAVENOUS | Status: DC | PRN
  Administered 2015-06-03 (×3): via INTRAVENOUS

## 2015-06-03 MED ORDER — LIDOCAINE 2% (20 MG/ML) 5 ML SYRINGE
INTRAMUSCULAR | Status: AC
  Filled 2015-06-03: qty 5

## 2015-06-03 MED ORDER — ENSURE ENLIVE PO LIQD
237.0000 mL | Freq: Two times a day (BID) | ORAL | Status: DC
  Administered 2015-06-04 (×2): 237 mL via ORAL

## 2015-06-03 MED ORDER — ACETAMINOPHEN 325 MG PO TABS: 650.0000 mg | ORAL_TABLET | ORAL | Status: DC | PRN

## 2015-06-03 MED ORDER — ONDANSETRON HCL 4 MG/2ML IJ SOLN: 4.0000 mg | INTRAMUSCULAR | Status: DC | PRN

## 2015-06-03 MED ORDER — NICOTINE 21 MG/24HR TD PT24: 21.0000 mg | MEDICATED_PATCH | Freq: Every day | TRANSDERMAL | Status: DC

## 2015-06-03 MED ORDER — EPHEDRINE SULFATE 50 MG/ML IJ SOLN
INTRAMUSCULAR | Status: DC | PRN
  Administered 2015-06-03 (×2): 10 mg via INTRAVENOUS

## 2015-06-03 MED ORDER — POLYETHYLENE GLYCOL 3350 17 G PO PACK: 17.0000 g | PACK | Freq: Every day | ORAL | Status: DC | PRN

## 2015-06-03 MED ORDER — METHOCARBAMOL 500 MG PO TABS
500.0000 mg | ORAL_TABLET | Freq: Four times a day (QID) | ORAL | Status: DC | PRN
  Administered 2015-06-03 – 2015-06-06 (×4): 500 mg via ORAL
  Filled 2015-06-03 (×6): qty 1

## 2015-06-03 MED ORDER — BUPIVACAINE LIPOSOME 1.3 % IJ SUSP
20.0000 mL | Freq: Once | INTRAMUSCULAR | Status: DC
  Filled 2015-06-03: qty 20

## 2015-06-03 MED ORDER — GLYCOPYRROLATE 0.2 MG/ML IV SOSY
PREFILLED_SYRINGE | INTRAVENOUS | Status: AC
  Filled 2015-06-03: qty 9

## 2015-06-03 MED ORDER — HYDROCODONE-ACETAMINOPHEN 5-325 MG PO TABS
1.0000 | ORAL_TABLET | ORAL | Status: DC | PRN
  Administered 2015-06-03: 1 via ORAL
  Administered 2015-06-04 – 2015-06-07 (×8): 2 via ORAL
  Filled 2015-06-03 (×6): qty 2
  Filled 2015-06-03: qty 1
  Filled 2015-06-03 (×2): qty 2

## 2015-06-03 MED ORDER — HYDROMORPHONE HCL 1 MG/ML IJ SOLN
INTRAMUSCULAR | Status: AC
  Filled 2015-06-03: qty 1

## 2015-06-03 MED ORDER — THROMBIN 20000 UNITS EX SOLR
CUTANEOUS | Status: AC
  Filled 2015-06-03: qty 20000

## 2015-06-03 MED ORDER — BUPIVACAINE LIPOSOME 1.3 % IJ SUSP
INTRAMUSCULAR | Status: DC | PRN
  Administered 2015-06-03 (×2): 20 mL

## 2015-06-03 MED ORDER — OXYCODONE-ACETAMINOPHEN 5-325 MG PO TABS
1.0000 | ORAL_TABLET | ORAL | Status: DC | PRN
  Administered 2015-06-03: 1 via ORAL
  Administered 2015-06-04 – 2015-06-06 (×6): 2 via ORAL
  Filled 2015-06-03 (×6): qty 2
  Filled 2015-06-03: qty 1

## 2015-06-03 MED ORDER — VITAMIN D (ERGOCALCIFEROL) 1.25 MG (50000 UNIT) PO CAPS: 50000.0000 [IU] | ORAL_CAPSULE | ORAL | Status: DC

## 2015-06-03 MED ORDER — MEPERIDINE HCL 25 MG/ML IJ SOLN: 6.2500 mg | INTRAMUSCULAR | Status: DC | PRN

## 2015-06-03 MED ORDER — CYCLOBENZAPRINE HCL 5 MG PO TABS
5.0000 mg | ORAL_TABLET | Freq: Three times a day (TID) | ORAL | Status: DC | PRN
  Administered 2015-06-03 – 2015-06-05 (×3): 5 mg via ORAL
  Filled 2015-06-03 (×3): qty 1

## 2015-06-03 MED ORDER — COLCHICINE 0.6 MG PO TABS
0.6000 mg | ORAL_TABLET | Freq: Every day | ORAL | Status: DC
  Administered 2015-06-03 – 2015-06-07 (×5): 0.6 mg via ORAL
  Filled 2015-06-03 (×5): qty 1

## 2015-06-03 MED ORDER — CHLORHEXIDINE GLUCONATE 4 % EX LIQD: 60.0000 mL | Freq: Once | CUTANEOUS | Status: DC

## 2015-06-03 MED ORDER — MIDAZOLAM HCL 5 MG/5ML IJ SOLN
INTRAMUSCULAR | Status: DC | PRN
  Administered 2015-06-03: 1 mg via INTRAVENOUS

## 2015-06-03 MED ORDER — GLYCOPYRROLATE 0.2 MG/ML IJ SOLN
INTRAMUSCULAR | Status: DC | PRN
  Administered 2015-06-03: 0.6 mg via INTRAVENOUS

## 2015-06-03 MED ORDER — ONDANSETRON HCL 4 MG/2ML IJ SOLN: 4.0000 mg | Freq: Once | INTRAMUSCULAR | Status: DC | PRN

## 2015-06-03 MED ORDER — MIDAZOLAM HCL 2 MG/2ML IJ SOLN
INTRAMUSCULAR | Status: AC
  Filled 2015-06-03: qty 2

## 2015-06-03 MED ORDER — SODIUM CHLORIDE 0.9% FLUSH: 3.0000 mL | INTRAVENOUS | Status: DC | PRN

## 2015-06-03 SURGICAL SUPPLY — 50 items
BUR RND FLUTED 2.5 (BURR) IMPLANT
BUR SABER RD CUTTING 3.0 (BURR) ×2 IMPLANT
CANISTER SUCTION 2500CC (MISCELLANEOUS) ×2 IMPLANT
COVER SURGICAL LIGHT HANDLE (MISCELLANEOUS) ×2 IMPLANT
DERMABOND ADHESIVE PROPEN (GAUZE/BANDAGES/DRESSINGS) ×1
DERMABOND ADVANCED (GAUZE/BANDAGES/DRESSINGS) ×1
DERMABOND ADVANCED .7 DNX12 (GAUZE/BANDAGES/DRESSINGS) ×1 IMPLANT
DERMABOND ADVANCED .7 DNX6 (GAUZE/BANDAGES/DRESSINGS) ×1 IMPLANT
DRAPE INCISE IOBAN 66X45 STRL (DRAPES) ×2 IMPLANT
DRAPE MICROSCOPE LEICA (MISCELLANEOUS) ×2 IMPLANT
DRAPE PROXIMA HALF (DRAPES) ×2 IMPLANT
DRAPE SURG 17X23 STRL (DRAPES) ×8 IMPLANT
DRSG MEPILEX BORDER 4X4 (GAUZE/BANDAGES/DRESSINGS) ×2 IMPLANT
DRSG MEPILEX BORDER 4X8 (GAUZE/BANDAGES/DRESSINGS) ×2 IMPLANT
DURAPREP 26ML APPLICATOR (WOUND CARE) ×2 IMPLANT
ELECT REM PT RETURN 9FT ADLT (ELECTROSURGICAL) ×2
ELECTRODE REM PT RTRN 9FT ADLT (ELECTROSURGICAL) ×1 IMPLANT
EVACUATOR 1/8 PVC DRAIN (DRAIN) IMPLANT
GLOVE BIOGEL PI IND STRL 8 (GLOVE) ×1 IMPLANT
GLOVE BIOGEL PI INDICATOR 8 (GLOVE) ×1
GLOVE ECLIPSE 9.0 STRL (GLOVE) ×2 IMPLANT
GLOVE ORTHO TXT STRL SZ7.5 (GLOVE) ×4 IMPLANT
GLOVE SURG 8.5 LATEX PF (GLOVE) ×2 IMPLANT
GOWN STRL REUS W/ TWL LRG LVL3 (GOWN DISPOSABLE) ×2 IMPLANT
GOWN STRL REUS W/TWL 2XL LVL3 (GOWN DISPOSABLE) ×4 IMPLANT
GOWN STRL REUS W/TWL LRG LVL3 (GOWN DISPOSABLE) ×2
KIT BASIN OR (CUSTOM PROCEDURE TRAY) ×2 IMPLANT
KIT ROOM TURNOVER OR (KITS) ×2 IMPLANT
NEEDLE SPNL 18GX3.5 QUINCKE PK (NEEDLE) ×4 IMPLANT
NS IRRIG 1000ML POUR BTL (IV SOLUTION) ×2 IMPLANT
PACK LAMINECTOMY ORTHO (CUSTOM PROCEDURE TRAY) ×2 IMPLANT
PAD ARMBOARD 7.5X6 YLW CONV (MISCELLANEOUS) ×6 IMPLANT
PATTIES SURGICAL .5 X.5 (GAUZE/BANDAGES/DRESSINGS) IMPLANT
PATTIES SURGICAL .75X.75 (GAUZE/BANDAGES/DRESSINGS) ×2 IMPLANT
PATTIES SURGICAL 1X1 (DISPOSABLE) IMPLANT
SPONGE LAP 4X18 X RAY DECT (DISPOSABLE) IMPLANT
SPONGE SURGIFOAM ABS GEL 100 (HEMOSTASIS) ×2 IMPLANT
STRIP CLOSURE SKIN 1/2X4 (GAUZE/BANDAGES/DRESSINGS) ×2 IMPLANT
SUT VIC AB 0 CT1 27 (SUTURE)
SUT VIC AB 0 CT1 27XBRD ANBCTR (SUTURE) IMPLANT
SUT VIC AB 1 CT1 27 (SUTURE) ×2
SUT VIC AB 1 CT1 27XBRD ANBCTR (SUTURE) ×2 IMPLANT
SUT VIC AB 2-0 CT1 27 (SUTURE)
SUT VIC AB 2-0 CT1 TAPERPNT 27 (SUTURE) IMPLANT
SUT VIC AB 3-0 X1 27 (SUTURE) ×2 IMPLANT
SUT VICRYL 0 UR6 27IN ABS (SUTURE) ×2 IMPLANT
TOWEL OR 17X24 6PK STRL BLUE (TOWEL DISPOSABLE) ×2 IMPLANT
TOWEL OR 17X26 10 PK STRL BLUE (TOWEL DISPOSABLE) ×2 IMPLANT
TRAY FOLEY CATH 16FRSI W/METER (SET/KITS/TRAYS/PACK) IMPLANT
WATER STERILE IRR 1000ML POUR (IV SOLUTION) ×2 IMPLANT

## 2015-06-03 NOTE — Care Management Note (Signed)
Case Management Note  Patient Details  Name: Tami Linamela Zunker MRN: [id number] Date of Birth: Feb 18, 1960  Subjective/Objective:                    Action/Plan:  Orders for HHPT and DME , will await PT/OT evals  Expected Discharge Date:                  Expected Discharge Plan:     In-House Referral:     Discharge planning Services  CM Consult  Post Acute Care Choice:  Home Health Choice offered to:     DME Arranged:    DME Agency:     HH Arranged:    HH Agency:     Status of Service:  In process, will continue to follow  Medicare Important Message Given:    Date Medicare IM Given:    Medicare IM give by:    Date Additional Medicare IM Given:    Additional Medicare Important Message give by:     If discussed at Long Length of Stay Meetings, dates discussed:    Additional Comments:  Kingsley PlanWile, Cedric Denison Marie, RN 06/03/2015, 1:45 PM

## 2015-06-03 NOTE — Transfer of Care (Signed)
Immediate Anesthesia Transfer of Care Note  Patient: Stacy [name]  Procedure(s) Performed: Procedure(s): Bilateral lateral recess decompression L2-3, L3-4, L4-5 (N/A)  Patient Location: PACU  Anesthesia Type:General  Level of Consciousness: awake, sedated and patient cooperative  Airway & Oxygen Therapy: Patient Spontanous Breathing and Patient connected to nasal cannula oxygen  Post-op Assessment: Report given to RN, Post -op Vital signs reviewed and stable and Patient moving all extremities  Post vital signs: Reviewed and stable  Last Vitals:  Filed Vitals:   06/03/15 0637  BP: 146/85  Pulse: 61  Temp: 37.1 C  Resp: 18    Last Pain:  Filed Vitals:   06/03/15 0651  PainSc: 9       Patients Stated Pain Goal: 3 (06/03/15 0651)  Complications: No apparent anesthesia complications

## 2015-06-03 NOTE — Anesthesia Postprocedure Evaluation (Signed)
Anesthesia Post Note  Patient: Stacy [name]  Procedure(s) Performed: Procedure(s) (LRB): Bilateral lateral recess decompression L2-3, L3-4, L4-5 (N/A)  Patient location during evaluation: PACU Anesthesia Type: General Level of consciousness: awake and alert Pain management: pain level controlled Vital Signs Assessment: post-procedure vital signs reviewed and stable Respiratory status: spontaneous breathing, nonlabored ventilation, respiratory function stable and patient connected to nasal cannula oxygen Cardiovascular status: blood pressure returned to baseline and stable Postop Assessment: no signs of nausea or vomiting Anesthetic complications: no    Last Vitals:  Filed Vitals:   06/03/15 1215 06/03/15 1222  BP: 143/87 145/81  Pulse: 69 67  Temp:  36.7 C  Resp: 11 10    Last Pain:  Filed Vitals:   06/03/15 1257  PainSc: Asleep                 Khalil Szczepanik DAVID

## 2015-06-03 NOTE — Op Note (Addendum)
06/03/2015  10:37 AM  PATIENT:  Stacy [name]  55 y.o. female  MRN: [id number]  OPERATIVE REPORT  PRE-OPERATIVE DIAGNOSIS:  L2-3, L3-4, L4-5 spinal stenosis  POST-OPERATIVE DIAGNOSIS:  L2-3, L3-4, L4-5 spinal stenosis  PROCEDURE:  Procedure(s): Bilateral lateral recess decompression L2-3, L3-4, L4-5    SURGEON:  [name], MD     ASSISTANT:  Benjiman Core, PA-C  (Present throughout the entire procedure and necessary for completion of procedure in a timely manner)     ANESTHESIA:  General,supplemented with local anesthetic marcaine 0.5% 1:1 exparel 1.3% total 30 cc. Dr. Lillia Abed.    COMPLICATIONS:  None.     DRAINS: Foley to straight drain.   EBL: 100cc  PROCEDURE:The patient was met in the holding area, and the appropriate bilateral L2-3, bilateral L3-4 and bilateral L4-5 identified and marked with "x" and my initials.The patient was then transported to OR and was placed under general anesthesia without difficulty. The patient received appropriate preoperative antibiotic prophylaxis. Foley catheter was placed sterilely. The patient after intubation atraumatically was transferred to the operating room table, prone position, Wilson frame and sliding OR table. All pressure points were well padded. The arms in 90-90 well-padded at the elbows. Standard prep with DuraPrep solution lower dorsal spine to the mid sacral segment. Draped in the usual manner iodine Vi-Drape was used. Time-out procedure was called and correct.An incision approximately two and a half in length was then made through skin and subcutaneous layers in the midline just at the level of the iliac crest. Cobb elevator was then introduced into the incision site and used to carefully form subperiosteal movement of the paralumbar muscles off of the posterior lamina bilaterally at the expected L2-3, L3-4 and L4-5 levels. The depth measured off Cobb elevators at about 60 mm and 60 mm retractors and placed on the [facility] equipment and guided  docking on the posterior aspect of the lamina at the expected L3-4 and  L4-5 level.Cross table lateral radiograph obtained documenting clamp placed at the L4 superior portion of spinous process. The L4 spinous process was then tagged with a single 0 Vicryl suture. And the skin at the level of the L4 spinous process and marked with a marking pen. The operating room microscope was brought into the field draped sterilely. The bilateral L2-3, L3-4 and L4-5 interspace carefully debrided the small amount of muscle attachment here. A Penfield #4 placed in the facet left L4-5 facet and a cross table lateral radiograph documented the Penfield #4 in the left L4-5 facet opposite the L5 pedicle. A high-speed bur used to drill the medial aspect of the left inferior articular process of L4 approximately 20%. 2 mm Kerrison then used to enter the spinal canal over the superior aspect of the L5 lamina carefully using the Kerrison to debris the attachment as a curet. Foraminotomy was then performed over the L5 nerve root. The medial 10% superior articular process of L5 then resected using an osteotome and 2 mm Kerrison. This allowed for identification of the thecal sac. Penfield #4 was then used to carefully mobilize the thecal sac medially and the L5 nerve root identified within the lateral recess flattened within the lateral recess of left L4-5. Carefully the lateral aspect of the L5 nerve root was identified and a Gaspar Garbe #4 was used to mobilize the nerve medially such that the disc was visible and found not to be herniated. Further foraminotomies was performed over the left L4 nerve root the  nerve root was noted to be decompressed. The L5 nerve root able to be retracted along the medial aspect of the L5 pedicle and ligamentum flavum and the L5 foramen was further decompressed with kerrison rongeurs.  Had a moderate amount of further resection of the L4 lamina inferiorly was performed. With this  then the disc space at L4-5 was easily visualized Ligamentum flavum was debrided from lateral recess along the medial aspect L4-5 facet no further decompression was necessary. Ball tip nerve probe was then able to carefully palpate the neuroforamen for L4 and L5 finding these to be well decompressed. Attention then turned to the left L3-4 level which was visualized with the OR Microscope. High-speed bur used to drill the medial aspect of the left inferior articular process of L3 approximately 20%. 2 mm Kerrison then used to enter the spinal canal over the superior aspect of the L4 lamina carefully using the Kerrison to debris the attachment as a curet. Foraminotomy was then performed over the L4 nerve root. The medial 20% superior articular process of L4 and then resected using an osteotome and 2 mm Kerrison. This allowed for identification of the thecal sac. Penfield 4 was then used to carefully mobilize the thecal sac medially and the L4 nerve root identified within the lateral recess flattenedwithin the lateral recess of left L3-4. While retracting the left ligamentum flavum a 3 millimeter Kerrison was used to resect the ligamentum flavum. Carefully the lateral aspect of the L4 nerve root was identified and a Penfield 4 was used to mobilize the nerve medially such that the disc was visible and found to be herniated. Further foraminotomies was performed over the left L4 nerve root the nerve root was noted to be decompressed. Also further exposure was obtained along the left side at the L3-4 level by resecting more of the inferior aspect of the left L3 lamina and resecting more ligamentum flavum superiorly and medially.A high-speed bur then used to bur the right side of the inferior aspect of the lamina of L3 and medial aspect of the right L3-4 facet 10% for a right-sided partial hemilaminectomy. The residual ligamentum flavum in the midline and on the right side was then resected and a partial right-sided L3-4  facetectomy performed using quarter-inch osteotome and 2 and 3 mm Kerrisons The right L4 nerve root able to be retracted along the medial aspect of the right L4 pedicle and ligamentum flavum and the right L4 foramen was further decompressed with kerrison rongeurs. Bone wax is applied to the bleeding cancellus bone surfaces and Gelfoam thrombin soaked then used to pack the lateral recesses both right and left side.  The disc space at L3-4 was easily visualized and found to not be herniated. Then thrombin soaked Gelfoam placed in the left lateral recess. At this point no further decompression was necessary. Ball tip nerve probe was then able to carefully palpate the neuroforamen for L3 and L4 finding these to be well decompressed. A high-speed bur used to drill the medial aspect of the left inferior articular process of L2 approximately 20%. 2 mm Kerrison then used to enter the spinal canal over the superior aspect of the L3 lamina carefully using the Kerrison to debris the attachment as a curet. Foraminotomy was then performed over the L3 nerve root. The medial 10% superior articular process of L3 then resected using an osteotome and 2 mm Kerrison. This allowed for identification of the thecal sac. Penfield 4 was then used to carefully mobilize the thecal sac  medially and the L3 nerve root identified within the lateral recess flattenedwithin the lateral recess of left L2-3. Carefully the lateral aspect of the L3 nerve root was identified and a Penfield 4 was used to mobilize the nerve medially such that the disc was visible and found not to be herniated. Further foraminotomies was performed over the left L2 nerve root the nerve root was noted to be decompressed. The L3 nerve root able to be retracted along the medial aspect of the L3 pedicle and ligamentum flavum and the L3 foramen was further decompressed with kerrison rongeurs.  Had a moderate amount of further resection of the L2 lamina inferiorly was  performed. With this then the disc space at L2-3 was easily visualized and found not to be a herniated disc. Ligamentum flavum was debrided from lateral recess along the medial aspect L2-3 facet.  Changing sides of the OR table to the right side. A high-speed bur used to drill the medial aspect of the right inferior articular process of L2 approximately 20%. 2 mm Kerrison then used to enter the spinal canal over the superior aspect of the L3 lamina carefully using the Kerrison to debris the attachment as a curet. Foraminotomy was then performed over the L3 nerve root. The medial 10% superior articular process of L3 and then resected using an osteotome and 2 mm Kerrison. This allowed for identification of the thecal sac. Penfield 4 was then used to carefully mobilize the thecal sac medially and the L3 nerve root identified within the lateral recess flattenedwithin the lateral recess of left L2-3. Carefully the lateral aspect of the L3 nerve root was identified and a Penfield 4 was used to mobilize the nerve medially such that the disc was visible and found not to  be herniated centrally. Further foraminotomies was performed over the right L2 nerve root the nerve root was noted to be decompressed. The L3 nerve root able to be retracted along the medial aspect of the L3 pedicle and ligamentum flavum and the L3 foramen was further decompressed with kerrison rongeurs.  Irrigation was then carried out. Ball tip nerve probe was then able to carefully palpate the neuroforamen for L2 and L3 finding these to be well decompressed.  The right side disc space at L2-3 was easily visualized Ligamentum flavum was debrided from lateral recess along the medial aspect L2-3 facet no further decompression was necessary. Ball tip nerve probe was then able to carefully palpate the neuroforamen for right L2 and L3 finding these to be well decompressed. Then assessing the L3-4 level carefully hemostasis was obtained removing  thrombin-soaked Gelfoam and cottonoids. The L3 and L4 nerve roots were well decompressed. Attention then turned to the right L4-5 level which was visualized with OR microscope. High-speed bur used to drill the medial aspect of the right inferior articular process of L4 approximately 20%. 2 mm Kerrison then used to enter the spinal canal over the superior aspect of the L5 lamina carefully using the Kerrison to debris the attachment as a curet. Foraminotomy was then performed over the L5 nerve root. The medial 20% superior articular process of L5 and then resected using an osteotome and 2 mm Kerrison. This allowed for identification of the thecal sac. Penfield 4 was then used to carefully mobilize the thecal sac medially and the L5 nerve root identified within the lateral recess flattened within the lateral recess of right L4-5. Carefully the lateral aspect of the L5 nerve root was identified and a Penfield 4 was used  to mobilize the nerve medially such that the disc was visible and found not to be herniated but protruded diffusely. Further foraminotomies was performed over the right L4 nerve root the nerve root was noted to be decompressed. The L5 nerve root able to be retracted along the medial aspect of the L5 pedicle and ligamentum flavum and the L5 foramen was further decompressed with kerrison rongeurs.  Had a moderate amount of further resection of the L4 lamina inferiorly was performed. With this then the disc space at L4-5 was easily visualized Ligamentum flavum was debrided from lateral recess along the medial aspect L4-5 facet no further decompression was necessary. Ball tip nerve probe was then able to carefully palpate the neuroforamen for right L4 and L5 finding these to be well decompressed. Under the operating room microscope then the right side L2-3, L3-4 and L4-5 foraminotomies were examined and further decompression carried out using Kerrisons and curettes until the decompression was observed  right side L2 L3 L4 and L5 nerve roots. Similarly this was performed on the left side at the L2-3 and L4-5 levels. Irrigation was carried out down to this bleeding controlled with Gelfoam. Gelfoam was then removed. Irrigation carried careful examination demonstrated no active bleeding present. Retractors were then carefully removed Since carefully then the Bleeding was then controlled using thrombin-soaked Gelfoam small cottonoids. Small amount of bleeding within the soft tissue mass the laminotomy area was controlled using bipolar electrocautery. Irrigation was carried out using copious amounts of irrigant solution. All Gelfoam were then removed. No significant active bleeding present at the time of removal. All instruments sponge counts were correct traction system was then carefully removed carefully rotating retractors with this withdrawal and only bipolar electrocautery of any small bleeders. Lumbodorsal fascia was then carefully approximated with interrupted 0 Vicryl sutures, UR 6 needle deep subcutaneous layers were approximated with interrupted 0 Vicryl sutures on UR 6 the appear subcutaneous layers approximated with interrupted 2-0 Vicryl sutures and the skin closed with a running subcutaneous stitch of 4-0 Vicryl. Dermabond was applied allowed to dry and then Mepilex bandage applied. Patient was then carefully turned to supine position on a regular OR table.   Benjiman Core, PA-C perform the duties of assistant surgeon during this case. he was present from the beginning of the case to the end of the case assisting in transfer the patient from his stretcher to the OR table and back to the stretcher at the end of the case. Assisted in careful retraction and suction of the laminectomy site delicate neural structures operating under the operating room microscope. He performed closure of the incision from the fascia to the skin applying the dressing.Retraction to facilitate each procedure being performed in a  timely basis.     NITKA,JAMES E  06/03/2015, 10:37 AM

## 2015-06-03 NOTE — Interval H&P Note (Signed)
The patient has been re-examined, and the chart reviewed, and there have been no interval changes to the documented history and physical.    The risks, benefits, and alternatives have been discussed at length, and the patient is willing to proceed.   

## 2015-06-03 NOTE — Anesthesia Procedure Notes (Signed)
Procedure Name: Intubation Date/Time: 06/03/2015 7:37 AM Performed by: Marni GriffonJAMES, Alenah Sarria B Pre-anesthesia Checklist: Emergency Drugs available, Patient identified, Suction available and Patient being monitored Patient Re-evaluated:Patient Re-evaluated prior to inductionOxygen Delivery Method: Circle system utilized Preoxygenation: Pre-oxygenation with 100% oxygen Intubation Type: IV induction Ventilation: Mask ventilation without difficulty Laryngoscope Size: Mac and 3 Grade View: Grade I Tube type: Oral Tube size: 7.5 mm Number of attempts: 1 Airway Equipment and Method: Stylet Placement Confirmation: ETT inserted through vocal cords under direct vision,  positive ETCO2 and breath sounds checked- equal and bilateral Secured at: 19 (cm at upper gum) cm Tube secured with: Tape Dental Injury: Teeth and Oropharynx as per pre-operative assessment

## 2015-06-03 NOTE — Anesthesia Preprocedure Evaluation (Addendum)
Anesthesia Evaluation  Patient identified by MRN, date of birth, ID band Patient awake    Reviewed: Allergy & Precautions, NPO status , Patient's Chart, lab work & pertinent test results, reviewed documented beta blocker date and time   Airway Mallampati: I  TM Distance: >3 FB Neck ROM: Full    Dental  (+) Edentulous Upper, Teeth Intact, Dental Advisory Given   Pulmonary Current Smoker,    Pulmonary exam normal        Cardiovascular hypertension, Pt. on medications and Pt. on home beta blockers Normal cardiovascular exam     Neuro/Psych    GI/Hepatic GERD  Controlled and Medicated,  Endo/Other  diabetes, Well Controlled, Type 2, Oral Hypoglycemic Agents  Renal/GU      Musculoskeletal   Abdominal   Peds  Hematology   Anesthesia Other Findings   Reproductive/Obstetrics                           Anesthesia Physical Anesthesia Plan  ASA: II  Anesthesia Plan: General   Post-op Pain Management:    Induction: Intravenous  Airway Management Planned: Oral ETT  Additional Equipment:   Intra-op Plan:   Post-operative Plan: Extubation in OR  Informed Consent: I have reviewed the patients History and Physical, chart, labs and discussed the procedure including the risks, benefits and alternatives for the proposed anesthesia with the patient or authorized representative who has indicated his/her understanding and acceptance.     Plan Discussed with: CRNA and Surgeon  Anesthesia Plan Comments:         Anesthesia Quick Evaluation

## 2015-06-03 NOTE — Discharge Instructions (Signed)
° ° °  No lifting greater than 10 lbs. °Avoid bending, stooping and twisting. °Walk in house for first week them may start to get out slowly increasing distance up to one quarter mile by 3 weeks post op. °Keep incision dry for 3 days, may use tegaderm or similar water impervious dressing. ° °

## 2015-06-03 NOTE — Brief Op Note (Signed)
PATIENT ID:      Tami Linamela Atteberry  MRN:     [id number] DOB/AGE:    1960/10/02 / 55 y.o.       OPERATIVE REPORT   DATE OF PROCEDURE:  06/03/2015      PREOPERATIVE DIAGNOSIS:   L2-3, L3-4, L4-5 spinal stenosis                                                       Body mass index is 25.19 kg/(m^2).    POSTOPERATIVE DIAGNOSIS:   L2-3, L3-4, L4-5 spinal stenosis                                                                     Body mass index is 25.19 kg/(m^2).    PROCEDURE:  Procedure(s): Bilateral lateral recess decompression L2-3, L3-4, L4-5    SURGEON: NITKA,JAMES E   ASSISTANT: Andee LinemanJames Owens,PA-C          ANESTHESIA:  General and supplemented with local anesthesia marcaine 0.5% 1:1 exparel 1.3% total 30 cc, Dr. Arta BruceKevin Ossey.  EBL:100 cc  DRAINS:Foley to SD  COMPLICATIONS:  None   CONDITION:  stable    NITKA,JAMES E 06/03/2015, 10:50 AM

## 2015-06-04 ENCOUNTER — Encounter (HOSPITAL_COMMUNITY): Payer: Self-pay | Admitting: Specialist

## 2015-06-04 LAB — GLUCOSE, CAPILLARY
Glucose-Capillary: 125 mg/dL — ABNORMAL HIGH (ref 65–99)
Glucose-Capillary: 104 mg/dL — ABNORMAL HIGH (ref 65–99)
Glucose-Capillary: 128 mg/dL — ABNORMAL HIGH (ref 65–99)
Glucose-Capillary: 131 mg/dL — ABNORMAL HIGH (ref 65–99)
Glucose-Capillary: 97 mg/dL (ref 65–99)

## 2015-06-04 LAB — CBC
HCT: 32.7 % — ABNORMAL LOW (ref 36.0–46.0)
Hemoglobin: 10.9 g/dL — ABNORMAL LOW (ref 12.0–15.0)
MCH: 27.1 pg (ref 26.0–34.0)
MCHC: 33.3 g/dL (ref 30.0–36.0)
MCV: 81.3 fL (ref 78.0–100.0)
Platelets: 187 10*3/uL (ref 150–400)
RBC: 4.02 MIL/uL (ref 3.87–5.11)
RDW: 13.1 % (ref 11.5–15.5)
WBC: 12 10*3/uL — ABNORMAL HIGH (ref 4.0–10.5)

## 2015-06-04 LAB — BASIC METABOLIC PANEL
Anion gap: 11 (ref 5–15)
BUN: 5 mg/dL — ABNORMAL LOW (ref 6–20)
CO2: 29 mmol/L (ref 22–32)
Calcium: 8.6 mg/dL — ABNORMAL LOW (ref 8.9–10.3)
Chloride: 94 mmol/L — ABNORMAL LOW (ref 101–111)
Creatinine, Ser: 0.97 mg/dL (ref 0.44–1.00)
GFR calc Af Amer: >60 mL/min (ref >60)
GFR calc non Af Amer: >60 mL/min (ref >60)
Glucose, Bld: 152 mg/dL — ABNORMAL HIGH (ref 65–99)
Potassium: 3.5 mmol/L (ref 3.5–5.1)
Sodium: 134 mmol/L — ABNORMAL LOW (ref 135–145)

## 2015-06-04 LAB — HEMOGLOBIN A1C
Hgb A1c MFr Bld: 7 % — ABNORMAL HIGH (ref 4.8–5.6)
Mean Plasma Glucose: 154 mg/dL

## 2015-06-04 MED ORDER — BOOST / RESOURCE BREEZE PO LIQD
1.0000 | Freq: Two times a day (BID) | ORAL | Status: DC
  Administered 2015-06-04 – 2015-06-07 (×7): 1 via ORAL

## 2015-06-04 MED ORDER — POTASSIUM CHLORIDE CRYS ER 20 MEQ PO TBCR
20.0000 meq | EXTENDED_RELEASE_TABLET | Freq: Every day | ORAL | Status: DC
  Administered 2015-06-04: 20 meq via ORAL
  Filled 2015-06-04: qty 1

## 2015-06-04 MED ORDER — CETYLPYRIDINIUM CHLORIDE 0.05 % MT LIQD
7.0000 mL | Freq: Two times a day (BID) | OROMUCOSAL | Status: DC
  Administered 2015-06-04 – 2015-06-06 (×5): 7 mL via OROMUCOSAL

## 2015-06-04 NOTE — Progress Notes (Signed)
Physical Therapy Treatment Patient Details Name: Stacy [name] MRN: [id number] DOB: [birth year] Today's Date: 06/04/2015    History of Present Illness 55 y/o female admitted with Bilateral lateral recess decompression L2-3, L3-4, L4-5 (06/03/15). PMH of arthritis, gout, HTN, GERD, COPD, DM2    PT Comments    Pt admitted with above diagnosis. Pt currently with functional limitations due to the deficits listed below (see PT Problem List).  Pt will benefit from skilled PT to increase their independence and safety with mobility to allow discharge to the venue listed below.  Pt has a flight of stairs to get to bathroom and bedroom.  She states her bed is very low and the couch on the 1st floor is very low and would be breaking her back precautions if she was to get down on it.  She is hoping for a hospital bed. She needs RW as well and HHPT.   Follow Up Recommendations  Home health PT;Supervision for mobility/OOB     Equipment Recommendations  Rolling walker with 5" wheels;Hospital bed    Recommendations for Other Services       Precautions / Restrictions Precautions Precautions: Back Precaution Booklet Issued: Yes (comment) Precaution Comments: Reviewed with pt and husband Required Braces or Orthoses: Other Brace/Splint Other Brace/Splint: no orders for brace, but does have abdominal binder for comfort.    Mobility  Bed Mobility Overal bed mobility: Needs Assistance Bed Mobility: Rolling;Sidelying to Sit;Sit to Sidelying Rolling: Min assist Sidelying to sit: Min assist     Sit to sidelying: Min assist General bed mobility comments: MIN A for all bed mobility with cues for maintaining back precautions  Transfers Overall transfer level: Needs assistance Equipment used: Rolling walker (2 wheeled) Transfers: Sit to/from Stand Sit to Stand: Min assist         General transfer comment: MIN A with cues for hand placement  Ambulation/Gait Ambulation/Gait assistance: Min  assist Ambulation Distance (Feet): 12 Feet Assistive device: Rolling walker (2 wheeled) Gait Pattern/deviations: Step-to pattern;Antalgic     General Gait Details: A with progression of RW and for step length. Slow and guarded gait   Stairs            Wheelchair Mobility    Modified Rankin (Stroke Patients Only)       Balance Overall balance assessment: Needs assistance Sitting-balance support: Feet supported Sitting balance-Leahy Scale: Fair       Standing balance-Leahy Scale: Poor Standing balance comment: requires UE support                    Cognition Arousal/Alertness: Awake/alert Behavior During Therapy: WFL for tasks assessed/performed Overall Cognitive Status: Within Functional Limits for tasks assessed       Memory: Decreased recall of precautions              Exercises      General Comments General comments (skin integrity, edema, etc.): Husband present and son arrived during session      Pertinent Vitals/Pain Pain Assessment: Faces Faces Pain Scale: Hurts whole lot Pain Location: no pain at rest, but FACES 8/10 with transitional movements Pain Descriptors / Indicators: Grimacing;Guarding Pain Intervention(s): Limited activity within patient's tolerance;Monitored during session;Repositioned    Home Living Family/patient expects to be discharged to:: Private residence Living Arrangements: Spouse/significant other Available Help at Discharge: Family;Available 24 hours/day Type of Home: House Home Access: Stairs to enter Entrance Stairs-Rails: None Home Layout: Two level Home Equipment: Bedside commode      Prior Function Level  of Independence: Independent          PT Goals (current goals can now be found in the care plan section) Acute Rehab PT Goals Patient Stated Goal: home with a hospital bed PT Goal Formulation: With patient/family Time For Goal Achievement: 06/11/15 Potential to Achieve Goals: Good     Frequency  Min 5X/week    PT Plan      Co-evaluation             End of Session Equipment Utilized During Treatment: Gait belt;Other (comment) (abd binder) Activity Tolerance: Patient limited by fatigue;Patient limited by pain Patient left: in bed;with call bell/phone within reach;with family/visitor present     Time: [id number] PT Time Calculation (min) (ACUTE ONLY): 35 min  Charges:  $Gait Training: 8-22 mins                    G Codes:      Vollie Aaron LUBECK 06/04/2015, 12:44 PM

## 2015-06-04 NOTE — Progress Notes (Addendum)
     Subjective: 1 Day Post-Op Procedure(s) (LRB): Bilateral lateral recess decompression L2-3, L3-4, L4-5 (N/A) Awake, alert and oriented x 4. Complains of back discomfort and muscle spasms. Foley to SD this AM will d/c. On phone when I entered room. Patient reports pain as moderate. Notes that her bed at home is low and her sofa is Also wonders if a bed can be ordered for home.   Objective:   VITALS:  Temp:  [98 F (36.7 C)-100.2 F (37.9 C)] 98.1 F (36.7 C) (05/02 0514) Pulse Rate:  [50-79] 70 (05/02 0514) Resp:  [9-18] 17 (05/02 0514) BP: (100-172)/(59-99) 113/62 mmHg (05/02 0514) SpO2:  [93 %-100 %] 100 % (05/02 0514)  Neurologically intact ABD soft Neurovascular intact Sensation intact distally Intact pulses distally Dorsiflexion/Plantar flexion intact Incision: dressing C/D/I Presently in bed with O2 pnc.  LABS  Recent Labs  06/03/15 0554 06/04/15 0252  HGB 13.3 10.9*  WBC 8.3 12.0*  PLT 237 187    Recent Labs  06/03/15 0554 06/04/15 0252  NA 135 134*  K 3.1* 3.5  CL 94* 94*  CO2 30 29  BUN 9 5*  CREATININE 0.93 0.97  GLUCOSE 122* 152*   No results for input(s): LABPT, INR in the last 72 hours.   Assessment/Plan: 1 Day Post-Op Procedure(s) (LRB): Bilateral lateral recess decompression L2-3, L3-4, L4-5 (N/A)  Advance diet Up with therapy Plan for discharge tomorrow  Discontinue foley Will order an abdomenal binder to be used when out of bed for comfort.  Agness Sibrian E 06/04/2015, 7:36 AM

## 2015-06-04 NOTE — Progress Notes (Signed)
Orthopedic Tech Progress Note Patient Details:  Stacy [name] 09-30-1960 [id number]  Ortho Devices Type of Ortho Device: Abdominal binder Ortho Device/Splint Interventions: Marisa SprinklesOrdered   Farmer Mccahill C Jarold Macomber 06/04/2015, 3:11 PM

## 2015-06-04 NOTE — Progress Notes (Signed)
Initial Nutrition Assessment  DOCUMENTATION CODES:   Not applicable  INTERVENTION:   -D/c Ensure Enlive po BID, each supplement provides 350 kcal and 20 grams of protein -Boost Breeze po BID, each supplement provides 250 kcal and 9 grams of protein -RD provided "Carbohydrate Counting for People with Diabetes" handout from [facility] Nutrition Care Manual along with RD contact information.   NUTRITION DIAGNOSIS:   Inadequate oral intake related to poor appetite as evidenced by per patient/family report.  GOAL:   Patient will meet greater than or equal to 90% of their needs  MONITOR:   PO intake, Supplement acceptance, Labs, Weight trends, Skin, I & O's  REASON FOR ASSESSMENT:   Malnutrition Screening Tool, Consult Poor PO  ASSESSMENT:   Patient is a 55 year old female, she has been followed for complaints of bilateral knee discomfort and low back pain. She has been experiencing ongoing difficulties with her knees tending to give way on her. 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   Pt admitted with bilateral lateral recess decompression.   S/p Procedure(s) 06/03/15: Bilateral lateral recess decompression L2-3, L3-4, L4-5  Hx obtained from pt and pt husband at bedside. Pt was sleepy at time of visit and also soft-spoken and was difficult to hear at times. Both pt and pt husband report a decline in appetite over the past 4 weeks, due to ongoing diarrhea. Per pt husband, "everything she tries to eat runs right through her". Pt reports she consumed very little of her breakfast this morning, however, noted meal completion 75-100%. Pt reports she does not like Ensure supplements or any type of milk products. Noted Ensure supplement at bedside table, that was less than 25% completed.   Pt endorses weight loss, likely due to poor po intake. Pt estimates that she has lost about 20# within the past month. Reviewed wt hx; recorded weight from 04/29/15 likely an  outlier, as weight has typically ranged from 165-170# within the past year.   Nutrition-Focused physical exam completed. Findings are no fat depletion, no muscle depletion, and no edema.   Discussed importance of good meal and supplement intake to promote healing. Pt amenable to try Boost Breeze supplement. Pt reveals that she tries to follow a DM diet at home, but sometimes "I just eat whatever I want". Per Hgb A1c results and pt husband, pt with fair glycemic control. Pt requesting "something to go by when I get home". RD provided "Carbohydrate Counting for People with Diabetes" handout from [facility] Nutrition Care Manual along with RD contact information.   Labs reviewed: Na: 134 (on IV supplementation), CBGS: 102-125. Hgb A1c: 7.0 (06/03/15).   Diet Order:  Diet Carb Modified Fluid consistency:: Thin; Room service appropriate?: Yes  Skin:  Reviewed, no issues  Last BM:  06/04/15  Height:   Ht Readings from Last 1 Encounters:  06/03/15 5\' 6"  (1.676 m)    Weight:   Wt Readings from Last 1 Encounters:  06/03/15 156 lb (70.761 kg)    Ideal Body Weight:  59.1 kg  BMI:  Body mass index is 25.19 kg/(m^2).  Estimated Nutritional Needs:   Kcal:  1600-1800  Protein:  75-85 grams  Fluid:  1.6-1.8 L  EDUCATION NEEDS:   Education needs addressed  Johncharles Fusselman A. Mayford KnifeWilliams, RD, LDN, CDE Pager: [phone] After hours Pager: [phone]

## 2015-06-04 NOTE — Evaluation (Signed)
Occupational Therapy Evaluation Patient Details Name: Stacy [name] MRN: [id number] DOB: 05/31/1960 Today's Date: 06/04/2015    History of Present Illness 55 y.o. female s/p Bilateral lateral recess decompression L2-3, L3-4, L4-5. PMH of arthritis, gout, HTN, GERD, COPD, DM2, HLD, asthma, SOB, headache, neuropathy.    Clinical Impression   Pt s/p above. Pt independent with ADLs, PTA. Feel pt will benefit from acute OT to increase independence prior to d/c. Pt in pain in session. Recommending HHOT at this time.     Follow Up Recommendations  Home health OT;Supervision/Assistance - 24 hour    Equipment Recommendations  Tub/shower bench;Other (comment) (AE)    Recommendations for Other Services       Precautions / Restrictions Precautions Precautions: Back Precaution Booklet Issued:  (spouse reports they received handout) Precaution Comments: educated on back precautions Required Braces or Orthoses: Other Brace/Splint Other Brace/Splint: abdominal binder Restrictions Weight Bearing Restrictions: No      Mobility Bed Mobility Overal bed mobility: Needs Assistance Bed Mobility: Sidelying to Sit Sidelying to sit: Max assist     General bed mobility comments: assist with LEs and with trunk.  Transfers Overall transfer level: Needs assistance Equipment used: Rolling walker (2 wheeled) Transfers: Sit to/from BJ'sStand;Stand Pivot Transfers Sit to Stand:(see comments) Stand pivot transfers: Min assist       General transfer comment: Min assist for sit to stand with RW; +2 assist for stand to sit to prevent fall without RW.     Balance Pt unsteady without RW when attempting to take steps and +2 assist given for stand to sit transfer to prevent fall when trialing without RW.                          ADL Overall ADL's : Needs assistance/impaired Eating/Feeding: Sitting;Modified independent                   Lower Body Dressing: Sit to/from stand;Total  assistance   Toilet Transfer: Stand-pivot;RW;Minimal assistance (more assist needed when trialing without RW)           Functional mobility during ADLs: Rolling walker;+2 for physical assistance (+2 assist to prevent pt from falling for stand to sit without RW; Min assist for stand pivot transfer with RW and Min assist for sit to stand transfer with RW)  General ADL Comments: Briefly mentioned AE. Pt in pain in session.      Vision     Perception     Praxis      Pertinent Vitals/Pain Pain Assessment: 0-10 Pain Score: 8  Faces Pain Scale: Hurts whole lot Pain Location: back and legs Pain Descriptors / Indicators: Grimacing;Cramping;Crying Pain Intervention(s): Limited activity within patient's tolerance;Repositioned;Monitored during session     Hand Dominance     Extremity/Trunk Assessment Upper Extremity Assessment Upper Extremity Assessment: Overall WFL for tasks assessed   Lower Extremity Assessment Lower Extremity Assessment: Defer to PT evaluation (Rt LE gave out when pt standing)       Communication Communication Communication: No difficulties   Cognition Arousal/Alertness: Awake/alert Behavior During Therapy: WFL for tasks assessed/performed Overall Cognitive Status: Within Functional Limits for tasks assessed                   General Comments       Exercises       Shoulder Instructions      Home Living Family/patient expects to be discharged to:: Private residence Living Arrangements: Spouse/significant other Available Help  at Discharge: Family;Available 24 hours/day Type of Home: House Home Access: Stairs to enter [employer] of Steps: 2 Entrance Stairs-Rails: None Home Layout: Two level Alternate Level Stairs-Number of Steps: 13 Alternate Level Stairs-Rails: Left Bathroom Shower/Tub: Tub/shower unit         Home Equipment: Bedside commode          Prior Functioning/Environment Level of Independence: Needs  assistance    ADL's / Homemaking Assistance Needed: assist with cooking and cleaning        OT Diagnosis: Acute pain   OT Problem List: Decreased strength;Decreased range of motion;Decreased activity tolerance;Impaired balance (sitting and/or standing);Decreased knowledge of use of DME or AE;Decreased knowledge of precautions;Pain   OT Treatment/Interventions: Patient/family education;Balance training;Therapeutic activities;Self-care/ADL training;DME and/or AE instruction    OT Goals(Current goals can be found in the care plan section) Acute Rehab OT Goals Patient Stated Goal: not stated OT Goal Formulation: With patient Time For Goal Achievement: 06/11/15 Potential to Achieve Goals: Good ADL Goals Pt Will Perform Lower Body Bathing: with min guard assist;sit to/from stand;with adaptive equipment Pt Will Perform Lower Body Dressing: with min guard assist;with adaptive equipment;sit to/from stand Pt Will Transfer to Toilet: with min guard assist;ambulating;bedside commode Pt Will Perform Toileting - Clothing Manipulation and hygiene: with min guard assist;sit to/from stand Pt Will Perform Tub/Shower Transfer: Tub transfer;with min assist;ambulating;tub bench;rolling walker  OT Frequency: Min 2X/week   Barriers to D/C:            Co-evaluation              End of Session Equipment Utilized During Treatment: Gait belt;Rolling walker;Other (comment) (abdominal binder) Nurse Communication: Mobility status;Other (comment) (recommending HHOT)  Activity Tolerance: Patient limited by pain Patient left: in chair;with call bell/phone within reach;with family/visitor present   Time: 1610-9604 OT Time Calculation (min): 18 min Charges:  OT General Charges $OT Visit: 1 Procedure OT Evaluation $OT Eval Moderate Complexity: 1 Procedure G-CodesEarlie Raveling OTR/L [phone] 06/04/2015, 3:40 PM

## 2015-06-05 LAB — CBC
HCT: 31.9 % — ABNORMAL LOW (ref 36.0–46.0)
Hemoglobin: 10.6 g/dL — ABNORMAL LOW (ref 12.0–15.0)
MCH: 27.1 pg (ref 26.0–34.0)
MCHC: 33.2 g/dL (ref 30.0–36.0)
MCV: 81.6 fL (ref 78.0–100.0)
Platelets: 175 10*3/uL (ref 150–400)
RBC: 3.91 MIL/uL (ref 3.87–5.11)
RDW: 13.2 % (ref 11.5–15.5)
WBC: 10 10*3/uL (ref 4.0–10.5)

## 2015-06-05 LAB — BASIC METABOLIC PANEL
Anion gap: 10 (ref 5–15)
BUN: <5 mg/dL — ABNORMAL LOW (ref 6–20)
CO2: 31 mmol/L (ref 22–32)
Calcium: 8.7 mg/dL — ABNORMAL LOW (ref 8.9–10.3)
Chloride: 96 mmol/L — ABNORMAL LOW (ref 101–111)
Creatinine, Ser: 0.87 mg/dL (ref 0.44–1.00)
GFR calc Af Amer: >60 mL/min (ref >60)
GFR calc non Af Amer: >60 mL/min (ref >60)
Glucose, Bld: 123 mg/dL — ABNORMAL HIGH (ref 65–99)
Potassium: 3.2 mmol/L — ABNORMAL LOW (ref 3.5–5.1)
Sodium: 137 mmol/L (ref 135–145)

## 2015-06-05 LAB — GLUCOSE, CAPILLARY
Glucose-Capillary: 135 mg/dL — ABNORMAL HIGH (ref 65–99)
Glucose-Capillary: 164 mg/dL — ABNORMAL HIGH (ref 65–99)
Glucose-Capillary: 114 mg/dL — ABNORMAL HIGH (ref 65–99)
Glucose-Capillary: 123 mg/dL — ABNORMAL HIGH (ref 65–99)

## 2015-06-05 MED ORDER — FERROUS GLUCONATE 324 (38 FE) MG PO TABS
324.0000 mg | ORAL_TABLET | Freq: Two times a day (BID) | ORAL | Status: DC
  Administered 2015-06-05 – 2015-06-07 (×5): 324 mg via ORAL
  Filled 2015-06-05 (×7): qty 1

## 2015-06-05 MED ORDER — POTASSIUM CHLORIDE CRYS ER 20 MEQ PO TBCR
30.0000 meq | EXTENDED_RELEASE_TABLET | Freq: Every day | ORAL | Status: DC
  Administered 2015-06-05 – 2015-06-06 (×2): 30 meq via ORAL
  Filled 2015-06-05 (×2): qty 1

## 2015-06-05 NOTE — Progress Notes (Signed)
Rehab liaison states no medically necessity for CIR...may need SNF if fails to improve with therapy today.  CSW consulted to follow up 06/06/15.  Case manager will continue to follow progress.    Quintella BatonJulie W. Leyani Gargus, RN, BSN  Trauma/Neuro ICU Case Manager [phone]

## 2015-06-05 NOTE — Progress Notes (Signed)
Occupational Therapy Treatment Patient Details Name: Stacy [name] MRN: [id number] DOB: 07/13/1960 Today's Date: 06/05/2015    History of present illness 55 y.o. female s/p Bilateral lateral recess decompression L2-3, L3-4, L4-5. PMH of arthritis, gout, HTN, GERD, COPD, DM2, HLD, asthma, SOB, headache, neuropathy.    OT comments  Pt in pain. OT Aed pt back pt bed.    Follow Up Recommendations  Home health OT;Supervision/Assistance - 24 hour;CIR;SNF    Equipment Recommendations  Tub/shower bench;Other (comment) (AE)    Recommendations for Other Services      Precautions / Restrictions Precautions Precautions: Back Precaution Comments: reviewed back precautions Required Braces or Orthoses: Other Brace/Splint Other Brace/Splint: abdominal binder Restrictions Weight Bearing Restrictions: No       Mobility Bed Mobility Overal bed mobility: Needs Assistance Bed Mobility: Sidelying to Sit Rolling: Min assist Sidelying to sit: Min assist       General bed mobility comments: Pt able to push trunk up with PT A legs down  Transfers Overall transfer level: Needs assistance Equipment used: Rolling walker (2 wheeled) Transfers: Sit to/from [employer] CorporationStand;Stand Pivot Transfers Sit to Stand: Mod assist Stand pivot transfers: Mod assist       General transfer comment: slow transition from sit <> stand with cues for hand placement    Balance Overall balance assessment: History of Falls   Sitting balance-Leahy Scale: Fair       Standing balance-Leahy Scale: Poor Standing balance comment: requires UE A                   ADL Overall ADL's : Needs assistance/impaired                     Lower Body Dressing: Sit to/from stand;Maximal assistance       Toileting- Clothing Manipulation and Hygiene: Total assistance;Sit to/from stand         General ADL Comments: issued AE and provided education.  Pt in significant pain during this session. Will need further  education.                  Cognition   Behavior During Therapy: WFL for tasks assessed/performed Overall Cognitive Status: Within Functional Limits for tasks assessed       Memory: Decreased recall of precautions               Extremity/Trunk Assessment                          Pertinent Vitals/ Pain       Pain Assessment: 0-10 Pain Score: 8  Pain Location: back Pain Descriptors / Indicators: Spasm;Sore;Aching Pain Intervention(s): Limited activity within patient's tolerance;Monitored during session;Repositioned;Patient requesting pain meds-RN notified         Frequency Min 2X/week     Progress Toward Goals  OT Goals(current goals can now be found in the care plan section)  Progress towards OT goals: Progressing toward goals  Acute Rehab OT Goals Patient Stated Goal: not stated  Plan Discharge plan needs to be updated    Co-evaluation                 End of Session Equipment Utilized During Treatment: Gait belt;Rolling walker;Other (comment) (abdominal binder)   Activity Tolerance Patient limited by pain   Patient Left in chair;with call bell/phone within reach;with family/visitor present   Nurse Communication Mobility status;Other (comment) (recommending HHOT)        Time: [id number] OT  Time Calculation (min): 18 min  Charges: OT General Charges $OT Visit: 1 Procedure OT Treatments $Self Care/Home Management : 8-22 mins  William Laske, [place] Kung 06/05/2015, 12:21 PM

## 2015-06-05 NOTE — Progress Notes (Signed)
Gave a pt. A bath but before we could change the linens the Pt. Stated she was having muscle spasms in the legs. We had to put pt. Back to bed.

## 2015-06-05 NOTE — Progress Notes (Signed)
Physical Therapy Treatment Patient Details Name: Stacy [name] MRN: [id number] DOB: Jan 12, 1961 Today's Date: 06/05/2015    History of Present Illness 55 y.o. female s/p Bilateral lateral recess decompression L2-3, L3-4, L4-5. PMH of arthritis, gout, HTN, GERD, COPD, DM2, HLD, asthma, SOB, headache, neuropathy.     PT Comments    Pt able to increase ambulation distance today with extremely slow, but had 4 episodes of buckeling requiring MOD A.  Pt also with 8/10 pain. Pt's husband present and supportive and he states she has to walk even farther to enter home.  At this time recommending CIR consult to work on increasing pt's safety and independence prior to dc home with husband.  Discussed with case management.  Follow Up Recommendations  CIR;Supervision for mobility/OOB     Equipment Recommendations  Rolling walker with 5" wheels;Hospital bed;Wheelchair (measurements PT)    Recommendations for Other Services Rehab consult     Precautions / Restrictions Precautions Precautions: Back Precaution Comments: reviewed back precautions Required Braces or Orthoses: Other Brace/Splint Other Brace/Splint: abdominal binder Restrictions Weight Bearing Restrictions: No    Mobility  Bed Mobility Overal bed mobility: Needs Assistance Bed Mobility: Sidelying to Sit Rolling: Min assist Sidelying to sit: Min assist       General bed mobility comments: Pt able to push trunk up with PT A legs down  Transfers Overall transfer level: Needs assistance Equipment used: Rolling walker (2 wheeled) Transfers: Sit to/from Stand Sit to Stand: Min assist;From elevated surface         General transfer comment: slow transition from sit <> stand with cues for hand placement  Ambulation/Gait Ambulation/Gait assistance: Min assist;Mod assist Ambulation Distance (Feet): 56 Feet Assistive device: Rolling walker (2 wheeled) Gait Pattern/deviations: Decreased step length - right;Decreased step length  - left;Narrow base of support Gait velocity: slow Gait velocity interpretation: Below normal speed for age/gender General Gait Details: Pt ambulated slowly with 4 episodes of buckeling requiring MOD A. Pt reports pain causing buckleing.  Pt with decreased coordination with foot placement. Gait with MIN A to MOD A at end of gait due to fatigue and pain.   Stairs            Wheelchair Mobility    Modified Rankin (Stroke Patients Only)       Balance Overall balance assessment: History of Falls   Sitting balance-Leahy Scale: Fair       Standing balance-Leahy Scale: Poor Standing balance comment: requires UE A                    Cognition Arousal/Alertness: Awake/alert Behavior During Therapy: WFL for tasks assessed/performed Overall Cognitive Status: Within Functional Limits for tasks assessed       Memory: Decreased recall of precautions              Exercises      General Comments General comments (skin integrity, edema, etc.): Husband present and observed gait.  Very supportive.      Pertinent Vitals/Pain Pain Assessment: 0-10 Pain Score: 8  Pain Location: back Pain Descriptors / Indicators: Grimacing;Cramping;Spasm Pain Intervention(s): Limited activity within patient's tolerance;Monitored during session;Relaxation;Patient requesting pain meds-RN notified    Home Living                      Prior Function            PT Goals (current goals can now be found in the care plan section) Acute Rehab PT Goals Patient Stated  Goal: not stated PT Goal Formulation: With patient/family Time For Goal Achievement: 06/11/15 Potential to Achieve Goals: Fair Progress towards PT goals: Progressing toward goals    Frequency  Min 5X/week    PT Plan Discharge plan needs to be updated    Co-evaluation             End of Session Equipment Utilized During Treatment: Gait belt;Other (comment) (Abd binder) Activity Tolerance: Patient  limited by pain Patient left: in chair;with call bell/phone within reach;with family/visitor present     Time: 1610-9604 PT Time Calculation (min) (ACUTE ONLY): 31 min  Charges:  $Gait Training: 8-22 mins $Therapeutic Activity: 8-22 mins                    G Codes:      Starling Jessie LUBECK 06/05/2015, 11:34 AM

## 2015-06-05 NOTE — Progress Notes (Signed)
Pt does not have qualifying diagnosis for physical or occupational therapy with Medicaid as payor.  Pt did poorly with therapy today....therapist now recommending possible CIR.  Will have inpatient rehab admissions liaison do screen to see if pt would be candidate for CIR.    Quintella BatonJulie W. Anisten Tomassi, RN, BSN  Trauma/Neuro ICU Case Manager [phone]

## 2015-06-05 NOTE — Progress Notes (Signed)
Rehab Admissions Coordinator Note:  Patient was screened by Clois DupesBoyette, Karry Causer Godwin for appropriateness for an Inpatient Acute Rehab Consult per PT and OT recommendations.  At this time, we are recommending SNF rehab. I do nto feel pt has medical neccessity for a hospital rehab admission at this time.Clois Dupes.  Dantavious Snowball Godwin 06/05/2015, 1:26 PM  I can be reached at [phone].

## 2015-06-05 NOTE — Progress Notes (Signed)
     Subjective: 2 Days Post-Op Procedure(s) (LRB): Bilateral lateral recess decompression L2-3, L3-4, L4-5 (N/A) Awake, alert and oriented x 4. Able to pivot to sitting yesterday, stood and walked a few Steps in the room. Tolerating pos. CBGs 100-130 range on SSI.  Patient reports pain as moderate.    Objective:   VITALS:  Temp:  [99 F (37.2 C)-100.2 F (37.9 C)] 100.2 F (37.9 C) (05/03 0537) Pulse Rate:  [60-102] 91 (05/03 0537) Resp:  [18-20] 18 (05/03 0537) BP: (100-116)/(51-63) 103/61 mmHg (05/03 0537) SpO2:  [99 %-100 %] 100 % (05/03 0537)  Neurologically intact ABD soft Neurovascular intact Sensation intact distally Intact pulses distally Dorsiflexion/Plantar flexion intact Incision: no drainage   LABS  Recent Labs  06/03/15 0554 06/04/15 0252 06/05/15 0227  HGB 13.3 10.9* 10.6*  WBC 8.3 12.0* 10.0  PLT 237 187 175    Recent Labs  06/04/15 0252 06/05/15 0227  NA 134* 137  K 3.5 3.2*  CL 94* 96*  CO2 29 31  BUN 5* <5*  CREATININE 0.97 0.87  GLUCOSE 152* 123*   No results for input(s): LABPT, INR in the last 72 hours.   Assessment/Plan: 2 Days Post-Op Procedure(s) (LRB): Bilateral lateral recess decompression L2-3, L3-4, L4-5 (N/A)  Advance diet Up with therapy D/C IV fluids Plan for discharge tomorrow Discharge home with home health  PT/OT slow for now Hospital bed order placed. TKO IVF as she has DM. CHeck Potassium level.  Stacy [name] E 06/05/2015, 7:46 AM

## 2015-06-06 LAB — GLUCOSE, CAPILLARY
Glucose-Capillary: 138 mg/dL — ABNORMAL HIGH (ref 65–99)
Glucose-Capillary: 141 mg/dL — ABNORMAL HIGH (ref 65–99)
Glucose-Capillary: 60 mg/dL — ABNORMAL LOW (ref 65–99)
Glucose-Capillary: 88 mg/dL (ref 65–99)
Glucose-Capillary: 121 mg/dL — ABNORMAL HIGH (ref 65–99)

## 2015-06-06 MED ORDER — OXYCODONE-ACETAMINOPHEN 5-325 MG PO TABS: 1.0000 | ORAL_TABLET | Freq: Four times a day (QID) | ORAL | Status: DC | PRN

## 2015-06-06 MED ORDER — METHOCARBAMOL 500 MG PO TABS: 500.0000 mg | ORAL_TABLET | Freq: Four times a day (QID) | ORAL | Status: DC | PRN

## 2015-06-06 NOTE — Progress Notes (Signed)
Prn norco administered this pm but not recorded in administered meds. Called pharmacy to check how to document prn meds that will not scan and advised to make a nursing entry

## 2015-06-06 NOTE — Progress Notes (Signed)
Pt states that she may be dc later today, per conversation with MD earlier today.  [facility]  They are to call husband, Leonette MostCharles with delivery information.  Pt made aware that she would not be receiving home health therapies [facility].  She is interested in outpatient therapy, and asks if MD could leave a Rx for outpatient PT/OT for her as [place]  Her Medicaid likely would cover OP therapy with a reasonable copay.  Pt states that her husband can take her to OP therapy appointments.    Thank you,   Quintella BatonJulie W. Jasier Calabretta, RN, BSN  Trauma/Neuro ICU Case Manager [phone]

## 2015-06-06 NOTE — Progress Notes (Signed)
Physical Therapy Treatment Patient Details Name: Stacy [name] MRN: [id number] DOB: 23-Sep-1960 Today's Date: 06/06/2015    History of Present Illness 55 y.o. female s/p Bilateral lateral recess decompression L2-3, L3-4, L4-5. PMH of arthritis, gout, HTN, GERD, COPD, DM2, HLD, asthma, SOB, headache, neuropathy.     PT Comments    Pt with excellent progress today. Pt able to get to EOB without assistance, and stands and ambulates at min guard level. Educated pt for HEP and stair training. Encouraged mobility at home. Follow up and equipment recommendations changed due to patient's progress during today's session.  Follow Up Recommendations  Home health PT     Equipment Recommendations  Rolling walker with 5" wheels;3in1 (PT)    Recommendations for Other Services       Precautions / Restrictions Precautions Precautions: Back Precaution Comments: per Dr.Nitka she doesn't have to follow precautions. Educated for comfort Other Brace/Splint: abdominal binder Restrictions Weight Bearing Restrictions: No    Mobility  Bed Mobility Overal bed mobility: Modified Independent Bed Mobility: Rolling;Sidelying to Sit Rolling: Modified independent (Device/Increase time) Sidelying to sit: Modified independent (Device/Increase time)       General bed mobility comments: educated for rolling. pt used rail during transfer.  Transfers Overall transfer level: Needs assistance   Transfers: Sit to/from Stand Sit to Stand: Min guard         General transfer comment: cues for hand placement. slow transition. guard for safety  Ambulation/Gait Ambulation/Gait assistance: Min guard Ambulation Distance (Feet): 125 Feet Assistive device: Rolling walker (2 wheeled) Gait Pattern/deviations: Step-through pattern;Decreased stride length   Gait velocity interpretation: Below normal speed for age/gender General Gait Details: cues for posture, depressed shoulders and position in  RW.   Stairs Stairs: Yes   Stair Management: Step to pattern;Sideways;One rail Left Number of Stairs: 9 General stair comments: cues for initial sequence only  Wheelchair Mobility    Modified Rankin (Stroke Patients Only)       Balance                                    Cognition Arousal/Alertness: Awake/alert Behavior During Therapy: WFL for tasks assessed/performed Overall Cognitive Status: Within Functional Limits for tasks assessed                      Exercises General Exercises - Lower Extremity Ankle Circles/Pumps: AROM;Both;10 reps;Seated Long Arc Quad: AROM;Seated;Both;10 reps Hip Flexion/Marching: AROM;Seated;Both;10 reps    General Comments        Pertinent Vitals/Pain Pain Assessment: No/denies pain    Home Living                      Prior Function            PT Goals (current goals can now be found in the care plan section) Progress towards PT goals: Goals met and updated - see care plan    Frequency       PT Plan Discharge plan needs to be updated;Equipment recommendations need to be updated    Co-evaluation             End of Session Equipment Utilized During Treatment: Gait belt Activity Tolerance: Patient tolerated treatment well Patient left: in chair;with call bell/phone within reach;with family/visitor present     Time: 1761-6073 PT Time Calculation (min) (ACUTE ONLY): 33 min  Charges:  $Gait Training: 23-37 mins  G CodesHaze Justin 06/06/2015, 12:13 PM   Haze Justin, SPT [phone]

## 2015-06-06 NOTE — Progress Notes (Signed)
Occupational Therapy Treatment Patient Details Name: Stacy [name] MRN: [id number] DOB: 1960/11/17 Today's Date: 06/06/2015    History of present illness 55 y.o. female s/p Bilateral lateral recess decompression L2-3, L3-4, L4-5. PMH of arthritis, gout, HTN, GERD, COPD, DM2, HLD, asthma, SOB, headache, neuropathy.    OT comments  Pt making good progress today with adls meeting 2 goals. Pt mobilizing better and more independent with adls. Use of AE reviewed and safe toileting techniques reviewed.    Follow Up Recommendations  Home health OT;Supervision/Assistance - 24 hour    Equipment Recommendations  Tub/shower bench;Other (comment)    Recommendations for Other Services      Precautions / Restrictions Precautions Precautions: Back Precaution Booklet Issued: Yes (comment) Precaution Comments: per Dr.Nitka she doesn't have to follow precautions. Educated for comfort Required Braces or Orthoses: Other Brace/Splint Other Brace/Splint: abdominal binder Restrictions Weight Bearing Restrictions: No       Mobility Bed Mobility Overal bed mobility: Modified Independent Bed Mobility: Rolling;Sidelying to Sit Rolling: Modified independent (Device/Increase time) Sidelying to sit: Modified independent (Device/Increase time)       General bed mobility comments: Pt in chair on arrival.  Transfers Overall transfer level: Needs assistance Equipment used: Rolling walker (2 wheeled) Transfers: Sit to/from Stand Sit to Stand: Min guard         General transfer comment: cues for hand placement. slow transition. guard for safety    Balance Overall balance assessment: Needs assistance Sitting-balance support: Feet supported Sitting balance-Leahy Scale: Good     Standing balance support: Bilateral upper extremity supported;During functional activity Standing balance-Leahy Scale: Fair Standing balance comment: Pt did better with balance. Pt stood at sink wo holding to sink or  walker for 2+minutes to groom with no LOB.                   ADL Overall ADL's : Needs assistance/impaired Eating/Feeding: Sitting;Modified independent   Grooming: Wash/dry face;Wash/dry hands;Supervision/safety;Sitting Grooming Details (indicate cue type and reason): cuing for back precautions.  Upper Body Bathing: Set up;Sitting   Lower Body Bathing: Minimal assistance;Sit to/from stand;Cueing for back precautions Lower Body Bathing Details (indicate cue type and reason): assist just to reach feet and S to stand and do bottom. Pt does have AE now which should help her be independent at home.     Lower Body Dressing: Minimal assistance;Sit to/from stand Lower Body Dressing Details (indicate cue type and reason): min assist to go over use of equipment again. Toilet Transfer: Supervision/safety;Ambulation;Comfort height toilet;Grab bars;RW;Cueing for safety Toilet Transfer Details (indicate cue type and reason): pt walked to bathroom with walker.   Toileting- Clothing Manipulation and Hygiene: Supervision/safety;Sit to/from stand Toileting - Clothing Manipulation Details (indicate cue type and reason): Pt able to clean self and pull pants up without assist.  Extra time given.     Functional mobility during ADLs: Rolling walker;Min guard General ADL Comments: Pt doing much better today with adls and with adl mobility. AEreviewed and techniques for safe toileting reviewed.      Vision                     Perception     Praxis      Cognition   Behavior During [facility]: [facility] for tasks assessed/performed Overall Cognitive Status: Within Functional Limits for tasks assessed                       Extremity/Trunk Assessment  Exercises General Exercises - Lower Extremity Ankle Circles/Pumps: AROM;Both;10 reps;Seated Long Arc Quad: AROM;Seated;Both;10 reps Hip Flexion/Marching: AROM;Seated;Both;10 reps   Shoulder Instructions        General Comments      Pertinent Vitals/ Pain       Pain Assessment: No/denies pain  Home Living                                          Prior Functioning/Environment              Frequency Min 2X/week     Progress Toward Goals  OT Goals(current goals can now be found in the care plan section)  Progress towards OT goals: Progressing toward goals  Acute Rehab OT Goals Patient Stated Goal: go home today OT Goal Formulation: With patient Time For Goal Achievement: 06/11/15 Potential to Achieve Goals: Good ADL Goals Pt Will Perform Lower Body Bathing: with min guard assist;sit to/from stand;with adaptive equipment Pt Will Perform Lower Body Dressing: with min guard assist;with adaptive equipment;sit to/from stand Pt Will Transfer to Toilet: with min guard assist;ambulating;bedside commode Pt Will Perform Toileting - Clothing Manipulation and hygiene: with min guard assist;sit to/from stand Pt Will Perform Tub/Shower Transfer: Tub transfer;with min assist;ambulating;tub bench;rolling walker  Plan Discharge plan needs to be updated    Co-evaluation                 End of Session Equipment Utilized During Treatment: Rolling walker   Activity Tolerance Patient tolerated treatment well   Patient Left in chair;with call bell/phone within reach;with family/visitor present   Nurse Communication Mobility status        Time: 1205-1225 OT Time Calculation (min): 20 min  Charges: OT General Charges $OT Visit: 1 Procedure OT Treatments $Self Care/Home Management : 8-22 mins  Hope Budds 06/06/2015, 12:31 PM  [phone]

## 2015-06-07 LAB — GLUCOSE, CAPILLARY
Glucose-Capillary: 117 mg/dL — ABNORMAL HIGH (ref 65–99)
Glucose-Capillary: 150 mg/dL — ABNORMAL HIGH (ref 65–99)

## 2015-06-07 MED ORDER — DOCUSATE SODIUM 100 MG PO CAPS: 100.0000 mg | ORAL_CAPSULE | Freq: Two times a day (BID) | ORAL | Status: DC

## 2015-06-07 MED ORDER — POTASSIUM CHLORIDE CRYS ER 20 MEQ PO TBCR
20.0000 meq | EXTENDED_RELEASE_TABLET | Freq: Every day | ORAL | Status: DC
  Administered 2015-06-07: 20 meq via ORAL
  Filled 2015-06-07: qty 1

## 2015-06-07 MED ORDER — POTASSIUM CHLORIDE CRYS ER 20 MEQ PO TBCR: 20.0000 meq | EXTENDED_RELEASE_TABLET | Freq: Every day | ORAL | Status: DC

## 2015-06-07 MED ORDER — CYCLOBENZAPRINE HCL 5 MG PO TABS: 5.0000 mg | ORAL_TABLET | Freq: Three times a day (TID) | ORAL | Status: DC | PRN

## 2015-06-07 MED ORDER — GABAPENTIN 300 MG PO CAPS: 300.0000 mg | ORAL_CAPSULE | Freq: Three times a day (TID) | ORAL | Status: DC

## 2015-06-07 NOTE — Progress Notes (Signed)
Physical Therapy Treatment Patient Details Name: Stacy [name] MRN: [id number] DOB: 04/12/1960 Today's Date: 06/07/2015    History of Present Illness 55 y.o. female s/p Bilateral lateral recess decompression L2-3, L3-4, L4-5. PMH of arthritis, gout, HTN, GERD, COPD, DM2, HLD, asthma, SOB, headache, neuropathy.     PT Comments    Pt performed review of HEP, stair training and gait training.  Pt required emphasis on R heel strike and safety with R knee extension in stance phase during stair training.    Follow Up Recommendations  Home health PT     Equipment Recommendations  Rolling walker with 5" wheels;3in1 (PT)    Recommendations for Other Services Rehab consult     Precautions / Restrictions Precautions Precautions: Back Precaution Booklet Issued: Yes (comment) Precaution Comments: per Dr.Nitka she doesn't have to follow precautions. Educated for comfort Required Braces or Orthoses: Other Brace/Splint Other Brace/Splint: abdominal binder Restrictions Weight Bearing Restrictions: No    Mobility  Bed Mobility               General bed mobility comments: Pt in chair on arrival.  Transfers Overall transfer level: Needs assistance Equipment used: Rolling walker (2 wheeled) Transfers: Sit to/from Stand Sit to Stand: Min guard Stand pivot transfers: Min guard       General transfer comment: cues for hand placement. slow transition. guard for safety  Ambulation/Gait Ambulation/Gait assistance: Min guard;Supervision Ambulation Distance (Feet): 210 Feet Assistive device: Rolling walker (2 wheeled) Gait Pattern/deviations: Step-through pattern;Antalgic;Decreased stride length Gait velocity: slow   General Gait Details: cues for posture, depressed shoulders and position in RW.   Stairs Stairs: Yes Stairs assistance: Min assist Stair Management: One rail Left;Sideways;Step to pattern Number of Stairs: 6 General stair comments: Pt required cues for sequencing  and foot placement.  R knee buckled on 4th step required min assist pt educated on quad control when ascending stair.  Pt educated to perform stair training with family at home to ensure safety.    Wheelchair Mobility    Modified Rankin (Stroke Patients Only)       Balance Overall balance assessment: Needs assistance Sitting-balance support: Feet supported Sitting balance-Leahy Scale: Good       Standing balance-Leahy Scale: Fair                      Cognition Arousal/Alertness: Awake/alert Behavior During Therapy: WFL for tasks assessed/performed Overall Cognitive Status: Within Functional Limits for tasks assessed       Memory: Decreased recall of precautions              Exercises General Exercises - Lower Extremity Ankle Circles/Pumps: AROM;Both;10 reps;Seated Quad Sets: AROM;Supine;Both;Seated;10 reps Long Arc Quad: AROM;Seated;Both;10 reps Hip ABduction/ADduction: AROM;Both;10 reps;Seated (via pillow squeeze.  ) Hip Flexion/Marching: AROM;Seated;Both;10 reps    General Comments        Pertinent Vitals/Pain Pain Assessment: 0-10 Pain Score: 5  Pain Location: back Pain Descriptors / Indicators: Sore;Spasm;Aching Pain Intervention(s): Monitored during session;Repositioned    Home Living                      Prior Function            PT Goals (current goals can now be found in the care plan section) Acute Rehab PT Goals Patient Stated Goal: go home today Potential to Achieve Goals: Fair Progress towards PT goals: Progressing toward goals    Frequency  Min 5X/week    PT Plan Discharge plan  needs to be updated;Equipment recommendations need to be updated    Co-evaluation             End of Session Equipment Utilized During Treatment: Gait belt Activity Tolerance: Patient tolerated treatment well Patient left: in chair;with call bell/phone within reach;with family/visitor present     Time: 1355-1433 PT Time  Calculation (min) (ACUTE ONLY): 38 min  Charges:  $Gait Training: 8-22 mins $Therapeutic Exercise: 8-22 mins $Therapeutic Activity: 8-22 mins                    G Codes:      Florestine Aversimee J Maura Braaten 06/07/2015, 2:44 PM  Joycelyn RuaAimee Larone Kliethermes, PTA pager [phone]

## 2015-06-07 NOTE — Discharge Summary (Signed)
Physician Discharge Summary      Patient ID: Stacy [name] MRN: [id number] DOB/AGE: 03/20/1960 55 y.o.  Admit date: 06/03/2015 Discharge date:06/07/2015   Admission Diagnoses:  Principal Problem:   Neurogenic claudication due to lumbar spinal stenosis Active Problems:   Chondromalacia of both patellae   Spinal stenosis, lumbar region, with neurogenic claudication   Discharge Diagnoses:  Same  Past Medical History  Diagnosis Date  . Hypertension     Pt on lisinopril  . GERD (gastroesophageal reflux disease)     Pt on Protonix daily  . Hyperlipidemia   . Asthma   . COPD (chronic obstructive pulmonary disease) (HCC)   . Shortness of breath     occasional - uses breathing tx at home  . Headache(784.0)     otc meds prn  . Irritable bowel syndrome 11/19/2010  . Diabetes mellitus     Type II  . Neuropathy (HCC)   . Arthritis   . Gout     Surgeries: Procedure(s): Bilateral lateral recess decompression L2-3, L3-4, L4-5 on 06/03/2015   Consultants:    Discharged Condition: Improved  Hospital Course: Stacy [name] is an 55 y.o. female who was admitted 06/03/2015 with a chief complaint of No chief complaint on file. , and found to have a diagnosis of Neurogenic claudication due to lumbar spinal stenosis.  She was brought to the operating room on 06/03/2015 and underwent the above named procedures.    She was given perioperative antibiotics:  Anti-infectives    Start     Dose/Rate Route Frequency Ordered Stop   06/03/15 1600  ceFAZolin (ANCEF) IVPB 1 g/50 mL premix     1 g 100 mL/hr over 30 Minutes Intravenous Every 8 hours 06/03/15 1252 06/03/15 2340   06/03/15 0715  ceFAZolin (ANCEF) IVPB 2g/100 mL premix     2 g 200 mL/hr over 30 Minutes Intravenous To Short Stay 06/02/15 1452 06/03/15 0755    This patient recovered in uneventfully in the PACU was transferred to MedSurg floor 6 N. bed 17. There she continued to progress vital signs remained stable and saturations remained  normal on O2 per nasal cannula. On postoperative day #1 her dressing was dry and vital signs were stable she is awake alert and oriented 4 motor sensory in both lower extremities was normal. Her pre-saw the patient began to aggressive mobilization. The catheter was discontinued and she was able to void without difficulty. He demonstrated very slow progress in physical therapy and mobilization. Postoperative day #2 she was awake alert and oriented 4 her potassium noted to be low at 3.2 and she was started on a potassium replacements. Her hemoglobin was 10.6 demonstrating some minimal anemia. Postoperative day #2 she was awake alert and oriented 4 continued with physical therapy and occupational therapy. Dressing was changed the incision dry without erythremia or drainage. She is able to stand slowly ambulate short distances began showing very slow progress in her therapy she noted difficulties at home with low couch her sofa as well as a very low bed practically on the floor. Occupational therapy and physical therapy assessed her status and felt that she would require a hospital bed for home to assist her as she would be unable to transfer to the bed in such a low level. She has significant patellofemoral osteoarthritis changes which also limited her capacity to squat and kneel. Therapy worked with her on postoperative day #2 and noted very slow progress recommended for skilled nursing facility following her hospitalization. This to work  on improving her overall independence before discharging home. Postoperative day #3 discussion with the patient regarding skilled nursing facility she did not express any aversion to this idea. Her IV fluids were TKOed continued to tolerate by mouth narcotics and by mouth nourishment. Hospital bed and was acquired for home. On postoperative day #3 she showed significant improvement in physical therapy and occupational therapy so that need for skilled nursing facility was not  demonstrated. On postoperative day #4 she was awake alert oriented 4 demonstrated ability to stand from her bed to use a bedside commode with minimal assistance. Her dressing changed and the incision was dry without erythremia or drainage area legs were neurovascularly normal. Patient is agreeable to discharge to her home with continued health nursing for physical therapy purposes and rehabilitation at home. Hospital bed and was scheduled for delivery to the patient's home between 10 AM and 2 PM on the day of her discharge. She was discharged home on postoperative day #4.  She was given sequential compression devices and early ambulation for DVT prophylaxis.  She benefited maximally from their hospital stay and there were no complications.    Recent vital signs:  Filed Vitals:   06/06/15 2143 06/07/15 0433  BP: 130/75 106/65  Pulse: 95 77  Temp: 99.9 F (37.7 C) 98.7 F (37.1 C)  Resp: 19 18    Recent laboratory studies:  Results for orders placed or performed during the hospital encounter of 06/03/15  Surgical pcr screen  Result Value Ref Range   MRSA, PCR NEGATIVE NEGATIVE   Staphylococcus aureus NEGATIVE NEGATIVE  CBC  Result Value Ref Range   WBC 8.3 4.0 - 10.5 K/uL   RBC 4.80 3.87 - 5.11 MIL/uL   Hemoglobin 13.3 12.0 - 15.0 g/dL   HCT 96.0 45.4 - 09.8 %   MCV 81.7 78.0 - 100.0 fL   MCH 27.7 26.0 - 34.0 pg   MCHC 33.9 30.0 - 36.0 g/dL   RDW 11.9 14.7 - 82.9 %   Platelets 237 150 - 400 K/uL  Comprehensive metabolic panel  Result Value Ref Range   Sodium 135 135 - 145 mmol/L   Potassium 3.1 (L) 3.5 - 5.1 mmol/L   Chloride 94 (L) 101 - 111 mmol/L   CO2 30 22 - 32 mmol/L   Glucose, Bld 122 (H) 65 - 99 mg/dL   BUN 9 6 - 20 mg/dL   Creatinine, Ser 5.62 0.44 - 1.00 mg/dL   Calcium 9.6 8.9 - 13.0 mg/dL   Total Protein 7.6 6.5 - 8.1 g/dL   Albumin 4.3 3.5 - 5.0 g/dL   AST 30 15 - 41 U/L   ALT 24 14 - 54 U/L   Alkaline Phosphatase 91 38 - 126 U/L   Total Bilirubin 0.7 0.3  - 1.2 mg/dL   GFR calc non Af Amer >60 >60 mL/min   GFR calc Af Amer >60 >60 mL/min   Anion gap 11 5 - 15  Glucose, capillary  Result Value Ref Range   Glucose-Capillary 117 (H) 65 - 99 mg/dL  Glucose, capillary  Result Value Ref Range   Glucose-Capillary 116 (H) 65 - 99 mg/dL   Comment 1 Notify RN   Hemoglobin A1c  Result Value Ref Range   Hgb A1c MFr Bld 7.0 (H) 4.8 - 5.6 %   Mean Plasma Glucose 154 mg/dL  CBC  Result Value Ref Range   WBC 12.0 (H) 4.0 - 10.5 K/uL   RBC 4.02 3.87 - 5.11 MIL/uL  Hemoglobin 10.9 (L) 12.0 - 15.0 g/dL   HCT 16.1 (L) 09.6 - 04.5 %   MCV 81.3 78.0 - 100.0 fL   MCH 27.1 26.0 - 34.0 pg   MCHC 33.3 30.0 - 36.0 g/dL   RDW 40.9 81.1 - 91.4 %   Platelets 187 150 - 400 K/uL  Basic Metabolic Panel  Result Value Ref Range   Sodium 134 (L) 135 - 145 mmol/L   Potassium 3.5 3.5 - 5.1 mmol/L   Chloride 94 (L) 101 - 111 mmol/L   CO2 29 22 - 32 mmol/L   Glucose, Bld 152 (H) 65 - 99 mg/dL   BUN 5 (L) 6 - 20 mg/dL   Creatinine, Ser 7.82 0.44 - 1.00 mg/dL   Calcium 8.6 (L) 8.9 - 10.3 mg/dL   GFR calc non Af Amer >60 >60 mL/min   GFR calc Af Amer >60 >60 mL/min   Anion gap 11 5 - 15  Glucose, capillary  Result Value Ref Range   Glucose-Capillary 102 (H) 65 - 99 mg/dL  Glucose, capillary  Result Value Ref Range   Glucose-Capillary 125 (H) 65 - 99 mg/dL  Glucose, capillary  Result Value Ref Range   Glucose-Capillary 104 (H) 65 - 99 mg/dL  Glucose, capillary  Result Value Ref Range   Glucose-Capillary 128 (H) 65 - 99 mg/dL  CBC  Result Value Ref Range   WBC 10.0 4.0 - 10.5 K/uL   RBC 3.91 3.87 - 5.11 MIL/uL   Hemoglobin 10.6 (L) 12.0 - 15.0 g/dL   HCT 95.6 (L) 21.3 - 08.6 %   MCV 81.6 78.0 - 100.0 fL   MCH 27.1 26.0 - 34.0 pg   MCHC 33.2 30.0 - 36.0 g/dL   RDW 57.8 46.9 - 62.9 %   Platelets 175 150 - 400 K/uL  Basic metabolic panel  Result Value Ref Range   Sodium 137 135 - 145 mmol/L   Potassium 3.2 (L) 3.5 - 5.1 mmol/L   Chloride 96 (L) 101  - 111 mmol/L   CO2 31 22 - 32 mmol/L   Glucose, Bld 123 (H) 65 - 99 mg/dL   BUN <5 (L) 6 - 20 mg/dL   Creatinine, Ser 5.28 0.44 - 1.00 mg/dL   Calcium 8.7 (L) 8.9 - 10.3 mg/dL   GFR calc non Af Amer >60 >60 mL/min   GFR calc Af Amer >60 >60 mL/min   Anion gap 10 5 - 15  Glucose, capillary  Result Value Ref Range   Glucose-Capillary 131 (H) 65 - 99 mg/dL  Glucose, capillary  Result Value Ref Range   Glucose-Capillary 97 65 - 99 mg/dL  Glucose, capillary  Result Value Ref Range   Glucose-Capillary 135 (H) 65 - 99 mg/dL  Glucose, capillary  Result Value Ref Range   Glucose-Capillary 164 (H) 65 - 99 mg/dL  Glucose, capillary  Result Value Ref Range   Glucose-Capillary 114 (H) 65 - 99 mg/dL  Glucose, capillary  Result Value Ref Range   Glucose-Capillary 123 (H) 65 - 99 mg/dL  Glucose, capillary  Result Value Ref Range   Glucose-Capillary 138 (H) 65 - 99 mg/dL   Comment 1 Notify RN   Glucose, capillary  Result Value Ref Range   Glucose-Capillary 141 (H) 65 - 99 mg/dL  Glucose, capillary  Result Value Ref Range   Glucose-Capillary 60 (L) 65 - 99 mg/dL   Comment 1 Notify RN   Glucose, capillary  Result Value Ref Range   Glucose-Capillary 88 65 -  99 mg/dL   Comment 1 Notify RN   Glucose, capillary  Result Value Ref Range   Glucose-Capillary 121 (H) 65 - 99 mg/dL  Glucose, capillary  Result Value Ref Range   Glucose-Capillary 117 (H) 65 - 99 mg/dL   Comment 1 Notify RN   Type and screen All Cardiac and thoracic surgeries, spinal fusions, myomectomies, craniotomies, colon & liver resections, total joint revisions, same day c-section with placenta previa or accreta.  Result Value Ref Range   ABO/RH(D) O POS    Antibody Screen NEG    Sample Expiration 06/06/2015   ABO/Rh  Result Value Ref Range   ABO/RH(D) O POS     Discharge Medications:     Medication List    TAKE these medications        accu-chek soft touch lancets  Use as instructed      acetaminophen-codeine 300-30 MG tablet  Commonly known as:  TYLENOL #3  Take 1 tablet by mouth every 4 (four) hours as needed.     albuterol (2.5 MG/3ML) 0.083% nebulizer solution  Commonly known as:  PROVENTIL  Take 3 mLs (2.5 mg total) by nebulization every 4 (four) hours as needed for wheezing.     allopurinol 100 MG tablet  Commonly known as:  ZYLOPRIM  Take 1 tablet (100 mg total) by mouth daily.     budesonide-formoterol 80-4.5 MCG/ACT inhaler  Commonly known as:  SYMBICORT  Inhale 2 puffs into the lungs 2 (two) times daily.     colchicine 0.6 MG tablet  Take 1 tablet (0.6 mg total) by mouth daily.     cyclobenzaprine 5 MG tablet  Commonly known as:  FLEXERIL  Take 1 tablet (5 mg total) by mouth 3 (three) times daily as needed for muscle spasms.     cyclobenzaprine 5 MG tablet  Commonly known as:  FLEXERIL  Take 1 tablet (5 mg total) by mouth 3 (three) times daily as needed for muscle spasms.     docusate sodium 100 MG capsule  Commonly known as:  COLACE  Take 1 capsule (100 mg total) by mouth 2 (two) times daily.     EPINEPHrine 0.3 mg/0.3 mL Soaj injection  Commonly known as:  EPI-PEN  Inject 0.3 mLs (0.3 mg total) into the muscle once.     gabapentin 300 MG capsule  Commonly known as:  NEURONTIN  Take 1 capsule (300 mg total) by mouth 3 (three) times daily.     gabapentin 300 MG capsule  Commonly known as:  NEURONTIN  Take 1 capsule (300 mg total) by mouth 3 (three) times daily.     glucose blood test strip  Commonly known as:  TRUE METRIX BLOOD GLUCOSE TEST  USE AS DIRECTED BY PHYSICIAN     glucose blood test strip  Commonly known as:  ACCU-CHEK AVIVA  Use as instructed     hydrochlorothiazide 25 MG tablet  Commonly known as:  HYDRODIURIL  Take 1 tablet (25 mg total) by mouth daily.     hydrocortisone-pramoxine 2.5-1 % rectal cream  Commonly known as:  ANALPRAM-HC  Place 1 application rectally 2 (two) times daily.     hydrOXYzine 10 MG tablet    Commonly known as:  ATARAX/VISTARIL  Take 1 tablet (10 mg total) by mouth 3 (three) times daily as needed.     metFORMIN 750 MG 24 hr tablet  Commonly known as:  GLUCOPHAGE-XR  Take 1 tablet (750 mg total) by mouth daily with breakfast.  methocarbamol 500 MG tablet  Commonly known as:  ROBAXIN  Take 1 tablet (500 mg total) by mouth every 6 (six) hours as needed for muscle spasms.     nicotine 21 mg/24hr patch  Commonly known as:  NICODERM CQ - dosed in mg/24 hours  Place 1 patch (21 mg total) onto the skin daily.     oxyCODONE-acetaminophen 5-325 MG tablet  Commonly known as:  PERCOCET/ROXICET  Take 1-2 tablets by mouth every 6 (six) hours as needed for moderate pain.     pantoprazole 40 MG tablet  Commonly known as:  PROTONIX  Take 1 tablet (40 mg total) by mouth daily.     potassium chloride SA 20 MEQ tablet  Commonly known as:  K-DUR,KLOR-CON  Take 1 tablet (20 mEq total) by mouth daily.     simvastatin 20 MG tablet  Commonly known as:  ZOCOR  Take 1 tablet (20 mg total) by mouth at bedtime.     TRUE METRIX METER Devi  1 Device by Does not apply route 4 (four) times daily -  before meals and at bedtime.     Vitamin D (Ergocalciferol) 50000 units Caps capsule  Commonly known as:  DRISDOL  Take 1 capsule (50,000 Units total) by mouth every 7 (seven) weeks. Sundays        Diagnostic Studies: Dg Chest 2 View  06/03/2015  CLINICAL DATA:  Preoperative examination. Patient for lumbar spine surgery today. Initial encounter. EXAM: CHEST  2 VIEW COMPARISON:  PA and lateral chest 05/25/2011. FINDINGS: There is some bullous change in the lung apices. The lungs are clear. Heart size is normal. No pneumothorax or pleural effusion. No focal bony abnormality. IMPRESSION: Emphysema without acute disease. Electronically Signed   By: Drusilla Kanner M.D.   On: 06/03/2015 07:12   Dg Lumbar Spine 2-3 Views  06/03/2015  CLINICAL DATA:  Lumbar decompression EXAM: LUMBAR SPINE - 2 VIEW  COMPARISON:  03/17/2015 FINDINGS: Initial image demonstrates 5 lumbar type vertebral bodies. Surgical instrument is noted in the posterior is soft tissues adjacent to the L4 spinous process just above the L4-5 disc space. The second film demonstrates a surgical retractor as well as a surgical instrument just below the L4-5 interspace. IMPRESSION: Intraoperative localization at L4-5 Electronically Signed   By: Alcide Clever M.D.   On: 06/03/2015 12:22    Disposition: 01-Home or Self Care      Discharge Instructions    Call MD / Call 911    Complete by:  As directed   If you experience chest pain or shortness of breath, CALL 911 and be transported to the hospital emergency room.  If you develope a fever above 101 F, pus (white drainage) or increased drainage or redness at the wound, or calf pain, call your surgeon's office.     Constipation Prevention    Complete by:  As directed   Drink plenty of fluids.  Prune juice may be helpful.  You may use a stool softener, such as Colace (over the counter) 100 mg twice a day.  Use MiraLax (over the counter) for constipation as needed.     Diet - low sodium heart healthy    Complete by:  As directed      Discharge instructions    Complete by:  As directed   No lifting greater than 10 lbs. Avoid bending, stooping and twisting. Walk in house for first week them may start to get out slowly increasing distance up to one quarter mile  by 3 weeks post op. Keep incision dry for 3 days, may use tegaderm or similar water impervious dressing.     Driving restrictions    Complete by:  As directed   No driving for 4 weeks     Increase activity slowly as tolerated    Complete by:  As directed      Lifting restrictions    Complete by:  As directed   No lifting for 8 weeks           Follow-up Information    Follow up with NITKA,JAMES E, MD In 2 weeks.   Specialty:  Orthopedic Surgery   Contact information:   [address] [place]  [address] [phone]        Signed: Kerrin Champagne 06/07/2015, 8:16 AM

## 2015-06-07 NOTE — Progress Notes (Signed)
Discussed discharge summary with patient. Reviewed all medications with patient. Patient received Rx. Patient ready for discharge. 

## 2015-06-07 NOTE — Progress Notes (Signed)
     Subjective: 4 Days Post-Op Procedure(s) (LRB): Bilateral lateral recess decompression L2-3, L3-4, L4-5 (N/A) The patient is awake alert and oriented 4. She is lying on her left side notes that he was able this morning to get up with the assistance of hospital personnel to a bedside commode to avoid and move her bowels. Abdomen is soft nontender she is tolerating by mouth nourishment and narcotics. She did well with physical therapy yesterday able to stand ambulate much better in the hallway has no stairs. Hospital bed has been arranged to be delivered to her home between 10 AM and 2 PM today. [facility] . Patient reports pain as moderate.    Objective:   VITALS:  Temp:  [98.7 F (37.1 C)-99.9 F (37.7 C)] 98.7 F (37.1 C) (05/05 0433) Pulse Rate:  [77-95] 77 (05/05 0433) Resp:  [18-19] 18 (05/05 0433) BP: (106-130)/(65-75) 106/65 mmHg (05/05 0433) SpO2:  [96 %-99 %] 99 % (05/05 0433)  Neurologically intact ABD soft Neurovascular intact Sensation intact distally Intact pulses distally Dorsiflexion/Plantar flexion intact Incision: no drainage No cellulitis present   LABS  Recent Labs  06/05/15 0227  HGB 10.6*  WBC 10.0  PLT 175    Recent Labs  06/05/15 0227  NA 137  K 3.2*  CL 96*  CO2 31  BUN <5*  CREATININE 0.87  GLUCOSE 123*   No results for input(s): LABPT, INR in the last 72 hours.   Assessment/Plan: 4 Days Post-Op Procedure(s) (LRB): Bilateral lateral recess decompression L2-3, L3-4, L4-5 (N/A)  Up with therapy D/C IV fluids Discharge home with home health  Shamonique Battiste E 06/07/2015, 8:07 AM

## 2015-06-07 NOTE — Care Management (Signed)
See previous case managers note. Stacy [facility] PA aware patient does not qualify for home health PT/OT . [facility] . Confirmed patient's cell number.   Stacy FlurryHeather Antwon Rochin RN BSN [phone]

## 2015-06-07 NOTE — Progress Notes (Signed)
     Subjective: 3Days Post-Op Procedure(s) (LRB): Bilateral lateral recess decompression L2-3, L3-4, L4-5 (N/A) Patient was seen on 06/06/2014, She was awake and alert and oriented x 4.  Taking and tolerating po narcotics and diet. PT and OT noted poor progression in Her therapy. Had discussion about transfer to a SNF for continued post op rehabilitation Due to slow progress. Social service consult requested.   Patient reports pain as moderate.    Objective:   VITALS:  Temp:  [98.7 F (37.1 C)-99.9 F (37.7 C)] 98.7 F (37.1 C) (05/05 0433) Pulse Rate:  [77-95] 77 (05/05 0433) Resp:  [18-19] 18 (05/05 0433) BP: (106-130)/(65-75) 106/65 mmHg (05/05 0433) SpO2:  [96 %-99 %] 99 % (05/05 0433)  Neurologically intact ABD soft Neurovascular intact Sensation intact distally Intact pulses distally Dorsiflexion/Plantar flexion intact Incision: no drainage   LABS  Recent Labs  06/05/15 0227  HGB 10.6*  WBC 10.0  PLT 175    Recent Labs  06/05/15 0227  NA 137  K 3.2*  CL 96*  CO2 31  BUN <5*  CREATININE 0.87  GLUCOSE 123*   No results for input(s): LABPT, INR in the last 72 hours.   Assessment/Plan: 3Days Post-Op Procedure(s) (LRB): Bilateral lateral recess decompression L2-3, L3-4, L4-5 (N/A)  Up with therapy D/C IV fluids Discharge to SNF if no significant progress in PT/OT OT/PT recommend a hospital bed as this patient's home situation is such that she has Her bed on the floor without a bed frame.  Gregor Dershem E 06/07/2015, 7:59 AM

## 2015-06-07 NOTE — Clinical Social Work Note (Signed)
CSW received referral for SNF.  Case discussed with case manager, and plan is to discharge home.  CSW to sign off please re-consult if social work needs arise.  Reis Pienta R. Raiana Pharris, MSW, LCSWA [phone]  

## 2015-06-12 ENCOUNTER — Other Ambulatory Visit: Payer: Self-pay

## 2015-06-12 DIAGNOSIS — Z1231 Encounter for screening mammogram for malignant neoplasm of breast: Secondary | ICD-10-CM

## 2015-06-13 ENCOUNTER — Ambulatory Visit: Payer: Medicaid Other | Attending: Internal Medicine | Admitting: Internal Medicine

## 2015-06-13 ENCOUNTER — Encounter: Payer: Self-pay | Admitting: Internal Medicine

## 2015-06-13 VITALS — BP 112/78 | HR 77 | Temp 98.1°F | Resp 18 | Ht 66.0 in | Wt 164.6 lb

## 2015-06-13 DIAGNOSIS — I10 Essential (primary) hypertension: Secondary | ICD-10-CM | POA: Insufficient documentation

## 2015-06-13 DIAGNOSIS — K219 Gastro-esophageal reflux disease without esophagitis: Secondary | ICD-10-CM | POA: Diagnosis not present

## 2015-06-13 DIAGNOSIS — J449 Chronic obstructive pulmonary disease, unspecified: Secondary | ICD-10-CM | POA: Diagnosis not present

## 2015-06-13 DIAGNOSIS — Z720 Tobacco use: Secondary | ICD-10-CM | POA: Insufficient documentation

## 2015-06-13 DIAGNOSIS — M545 Low back pain, unspecified: Secondary | ICD-10-CM

## 2015-06-13 DIAGNOSIS — E119 Type 2 diabetes mellitus without complications: Secondary | ICD-10-CM | POA: Insufficient documentation

## 2015-06-13 DIAGNOSIS — Z79899 Other long term (current) drug therapy: Secondary | ICD-10-CM | POA: Diagnosis not present

## 2015-06-13 DIAGNOSIS — E785 Hyperlipidemia, unspecified: Secondary | ICD-10-CM | POA: Diagnosis not present

## 2015-06-13 DIAGNOSIS — F17200 Nicotine dependence, unspecified, uncomplicated: Secondary | ICD-10-CM | POA: Diagnosis not present

## 2015-06-13 DIAGNOSIS — Z7984 Long term (current) use of oral hypoglycemic drugs: Secondary | ICD-10-CM | POA: Diagnosis not present

## 2015-06-13 LAB — BASIC METABOLIC PANEL
BUN: 7 mg/dL (ref 7–25)
CO2: 28 mmol/L (ref 20–31)
Calcium: 10 mg/dL (ref 8.6–10.4)
Chloride: 95 mmol/L — ABNORMAL LOW (ref 98–110)
Creat: 0.72 mg/dL (ref 0.50–1.05)
Glucose, Bld: 122 mg/dL — ABNORMAL HIGH (ref 65–99)
Potassium: 4 mmol/L (ref 3.5–5.3)
Sodium: 134 mmol/L — ABNORMAL LOW (ref 135–146)

## 2015-06-13 LAB — GLUCOSE, POCT (MANUAL RESULT ENTRY): POC Glucose: 155 mg/dl — AB (ref 70–99)

## 2015-06-13 NOTE — Progress Notes (Signed)
Patient is here for FU DM  Patient denies pain at this time.  Patient has taken medication but patient has not eaten today.

## 2015-06-13 NOTE — Progress Notes (Signed)
Patient ID: Stacy [name], female   DOB: [birth year], 55 y.o.   MRN: [id number]   Stacy [name], is a 55 y.o. female  [id number]  [id number]  DOB - [birth year]  Chief Complaint  Patient presents with  . Follow-up    DM        Subjective:   Stacy Linamela Keir is a 55 y.o. 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 She recently had Lumbar Laminectomy/Decompression Microdiscectomy for L2-L3, L3-L4 and L4-L5 Spinal Stenosis. She wears a brace and walks with a walker. She still awaits surgery on her knees for advanced osteoarthritis. She is scheduled for rehabilitation in a week. She has no new complaint today except for on-going pain but she says her back pain is much better after the surgery. Patient has No headache, No chest pain, No abdominal pain - No Nausea, No new weakness tingling or numbness, No Cough - SOB. She will need Tylenol #3 prescription when she runs out of her current pain medication prescribed by orthopedic surgeon.  Problem  Type 2 Diabetes Mellitus Without Complication, Without Long-Term Current Use of Insulin (Hcc)    ALLERGIES: Allergies  Allergen Reactions  . Other Shortness Of Breath    Allergic to perfumes and cleaning products  . Aspirin Nausea Only    unknown  . Ace Inhibitors Other (See Comments)    Cough, wheeze    PAST MEDICAL HISTORY: Past Medical History  Diagnosis Date  . Hypertension     Pt on lisinopril  . GERD (gastroesophageal reflux disease)     Pt on Protonix daily  . Hyperlipidemia   . Asthma   . COPD (chronic obstructive pulmonary disease) (HCC)   . Shortness of breath     occasional - uses breathing tx at home  . Headache(784.0)     otc meds prn  . Irritable bowel syndrome 11/19/2010  . Diabetes mellitus     Type II  . Neuropathy  (HCC)   . Arthritis   . Gout     MEDICATIONS AT HOME: Prior to Admission medications   Medication Sig Start Date End Date Taking? Authorizing Provider  acetaminophen-codeine (TYLENOL #3) 300-30 MG tablet Take 1 tablet by mouth every 4 (four) hours as needed. 05/30/15   Quentin Angstlugbemiga E Paxton Binns, MD  albuterol (PROVENTIL) (2.5 MG/3ML) 0.083% nebulizer solution Take 3 mLs (2.5 mg total) by nebulization every 4 (four) hours as needed for wheezing. 04/25/15   Quentin Angstlugbemiga E Gabor Lusk, MD  allopurinol (ZYLOPRIM) 100 MG tablet Take 1 tablet (100 mg total) by mouth daily. 05/30/15   Quentin Angstlugbemiga E Klyn Kroening, MD  Blood Glucose Monitoring Suppl (TRUE METRIX METER) DEVI 1 Device by Does not apply route 4 (four) times daily -  before meals and at bedtime. 11/14/14   Jaclyn ShaggyEnobong Amao, MD  budesonide-formoterol (SYMBICORT) 80-4.5 MCG/ACT inhaler Inhale 2 puffs into the lungs 2 (two) times daily. 05/30/15 05/29/16  Quentin Angstlugbemiga E Cyanne Delmar, MD  colchicine 0.6 MG tablet Take 1 tablet (0.6 mg total) by mouth daily. 05/30/15   Quentin Angstlugbemiga E Valeree Leidy, MD  cyclobenzaprine (FLEXERIL) 5 MG tablet Take 1 tablet (5 mg total) by mouth 3 (three) times daily as needed for muscle spasms. 06/07/15   Kerrin ChampagneJames E Nitka, MD  docusate sodium (COLACE) 100 MG capsule Take 1 capsule (100 mg total) by mouth 2 (two) times daily. 06/07/15   Guy SandiferJames E  [name], MD  EPINEPHrine 0.3 mg/0.3 mL IJ SOAJ injection Inject 0.3 mLs (0.3 mg total) into the muscle once. 09/10/14   Quentin Angst, MD  gabapentin (NEURONTIN) 300 MG capsule Take 1 capsule (300 mg total) by mouth 3 (three) times daily. 02/21/15   Quentin Angst, MD  glucose blood (ACCU-CHEK AVIVA) test strip Use as instructed 02/22/15   Quentin Angst, MD  glucose blood (TRUE METRIX BLOOD GLUCOSE TEST) test strip USE AS DIRECTED BY PHYSICIAN 07/31/14   Quentin Angst, MD  hydrochlorothiazide (HYDRODIURIL) 25 MG tablet Take 1 tablet (25 mg total) by mouth daily. 04/25/15   Quentin Angst, MD    hydrocortisone-pramoxine [facility]) 2.5-1 % rectal cream Place 1 application rectally 2 (two) times daily. 11/16/14   Jaclyn Shaggy, MD  hydrOXYzine (ATARAX/VISTARIL) 10 MG tablet Take 1 tablet (10 mg total) by mouth 3 (three) times daily as needed. 02/21/15   Quentin Angst, MD  Lancets (ACCU-CHEK SOFT TOUCH) lancets Use as instructed 02/22/15   Quentin Angst, MD  metFORMIN (GLUCOPHAGE-XR) 750 MG 24 hr tablet Take 1 tablet (750 mg total) by mouth daily with breakfast. 02/21/15   Quentin Angst, MD  methocarbamol (ROBAXIN) 500 MG tablet Take 1 tablet (500 mg total) by mouth every 6 (six) hours as needed for muscle spasms. 06/06/15   Naida Sleight, PA-C  nicotine (NICODERM CQ - DOSED IN MG/24 HOURS) 21 mg/24hr patch Place 1 patch (21 mg total) onto the skin daily. Patient not taking: Reported on 05/30/2015 10/11/14   Quentin Angst, MD  oxyCODONE-acetaminophen (PERCOCET/ROXICET) 5-325 MG tablet Take 1-2 tablets by mouth every 6 (six) hours as needed for moderate pain. 06/06/15   Naida Sleight, PA-C  pantoprazole (PROTONIX) 40 MG tablet Take 1 tablet (40 mg total) by mouth daily. 02/21/15   Quentin Angst, MD  potassium chloride SA (K-DUR,KLOR-CON) 20 MEQ tablet Take 1 tablet (20 mEq total) by mouth daily. 06/07/15   Kerrin Champagne, MD  simvastatin (ZOCOR) 20 MG tablet Take 1 tablet (20 mg total) by mouth at bedtime. 02/21/15   Quentin Angst, MD  Vitamin D, Ergocalciferol, (DRISDOL) 50000 UNITS CAPS capsule Take 1 capsule (50,000 Units total) by mouth every 7 (seven) weeks. Sundays 10/11/14   Quentin Angst, MD     Objective:   There were no vitals filed for this visit.  Exam General appearance : Awake, alert, not in any distress. Speech Clear. Not toxic looking HEENT: Atraumatic and Normocephalic, pupils equally reactive to light and accomodation Neck: supple, no JVD. No cervical lymphadenopathy.  Chest:Good air entry bilaterally, no added sounds  CVS: S1 S2 regular, no  murmurs.  Abdomen: Back brace in situ. Bowel sounds present, Non tender and not distended with no gaurding, rigidity or rebound. Extremities: B/L Lower Ext shows no edema, both legs are warm to touch Neurology: Awake alert, and oriented X 3, CN II-XII intact, Non focal Skin: No Rash  Data Review Lab Results  Component Value Date   HGBA1C 7.0* 06/03/2015   HGBA1C 6.7 05/30/2015   HGBA1C 6.30 02/21/2015     Assessment & Plan   1. Bilateral low back pain without sciatica  S/P Surgery Continue pain medication Call for Tylenol #3 when out of Oxycodone Keep appointment with Rehabilitation Fall precautions emphasized  2. Type 2 diabetes mellitus without complication, without long-term current use of insulin (HCC)  Aim for 30 minutes of exercise most days. Rethink what you drink. Water is great!  Aim for 2-3 Carb Choices per meal (30-45 grams) +/- 1 either way  Aim for 0-15 Carbs per snack if hungry  Include protein in moderation with your meals and snacks  Consider reading food labels for Total Carbohydrate and Fat Grams of foods  Consider checking BG at alternate times per day  Continue taking medication as directed Be mindful about how much sugar you are adding to beverages and other foods. Fruit Punch - find one with no sugar  Measure and decrease portions of carbohydrate foods  Make your plate and don't go back for seconds  3. Tobacco use disorder  Rakayla was counseled on the dangers of tobacco use, and was advised to quit. Reviewed strategies to maximize success, including removing cigarettes and smoking materials from environment, stress management and support of family/friends.  4. Essential hypertension  - Basic Metabolic Panel  We have discussed target BP range and blood pressure goal. I have advised patient to check BP regularly and to call us back or report to clinic if the numbers are consistently higher than 140/90. We discussed the importance of compliance with  medical therapy and DASH diet recommended, consequences of uncontrolled hypertension discussed.   - continue current BP medications  Patient have been counseled extensively about nutrition and exercise  Return in about 6 weeks (around 07/25/2015) for Hemoglobin A1C and Follow up, DM, Follow up HTN, Follow up Pain and comorbidities.  The patient was given clear instructions to go to ER or return to medical center if symptoms don't improve, worsen or new problems develop. The patient verbalized understanding. The patient was told to call to get lab results if they haven't heard anything in the next week.   This note has been created with Education officer, environmental. Any transcriptional errors are unintentional.    Jeanann Lewandowsky, MD, MHA, Maxwell Caul, CPE [facility] [place], [place] [phone]   06/13/2015, 12:03 PM

## 2015-06-13 NOTE — Patient Instructions (Signed)
Joint Pain Joint pain, which is also called arthralgia, can be caused by many things. Joint pain often goes away when you follow your health care provider's instructions for relieving pain at home. However, joint pain can also be caused by conditions that require further treatment. Common causes of joint pain include:  Bruising in the area of the joint.  Overuse of the joint.  Wear and tear on the joints that occur with aging (osteoarthritis).  Various other forms of arthritis.  A buildup of a crystal form of uric acid in the joint (gout).  Infections of the joint (septic arthritis) or of the bone (osteomyelitis). Your health care provider may recommend medicine to help with the pain. If your joint pain continues, additional tests may be needed to diagnose your condition. HOME CARE INSTRUCTIONS Watch your condition for any changes. Follow these instructions as directed to lessen the pain that you are feeling.  Take medicines only as directed by your health care provider.  Rest the affected area for as long as your health care provider says that you should. If directed to do so, raise the painful joint above the level of your heart while you are sitting or lying down.  Do not do things that cause or worsen pain.  If directed, apply ice to the painful area:  Put ice in a plastic bag.  Place a towel between your skin and the bag.  Leave the ice on for 20 minutes, 2-3 times per day.  Wear an elastic bandage, splint, or sling as directed by your health care provider. Loosen the elastic bandage or splint if your fingers or toes become numb and tingle, or if they turn cold and blue.  Begin exercising or stretching the affected area as directed by your health care provider. Ask your health care provider what types of exercise are safe for you.  Keep all follow-up visits as directed by your health care provider. This is important. SEEK MEDICAL CARE IF:  Your pain increases, and medicine  does not help.  Your joint pain does not improve within 3 days.  You have increased bruising or swelling.  You have a fever.  You lose 10 lb (4.5 kg) or more without trying. SEEK IMMEDIATE MEDICAL CARE IF:  You are not able to move the joint.  Your fingers or toes become numb or they turn cold and blue.   This information is not intended to replace advice given to you by your health care provider. Make sure you discuss any questions you have with your health care provider.   Document Released: 01/19/2005 Document Revised: 02/09/2014 Document Reviewed: 10/31/2013 Elsevier Interactive Patient Education 2016 [employer]. Diabetes and Exercise Exercising regularly is important. It is not just about losing weight. It has many health benefits, such as:  Improving your overall fitness, flexibility, and endurance.  Increasing your bone density.  Helping with weight control.  Decreasing your body fat.  Increasing your muscle strength.  Reducing stress and tension.  Improving your overall health. People with diabetes who exercise gain additional benefits because exercise:  Reduces appetite.  Improves the body's use of blood sugar (glucose).  Helps lower or control blood glucose.  Decreases blood pressure.  Helps control blood lipids (such as cholesterol and triglycerides).  Improves the body's use of the hormone insulin by:  Increasing the body's insulin sensitivity.  Reducing the body's insulin needs.  Decreases the risk for heart disease because exercising:  Lowers cholesterol and triglycerides levels.  Increases the  levels of good cholesterol (such as high-density lipoproteins [HDL]) in the body.  Lowers blood glucose levels. YOUR ACTIVITY PLAN  Choose an activity that you enjoy, and set realistic goals. To exercise safely, you should begin practicing any new physical activity slowly, and gradually increase the intensity of the exercise over time. Your health  care provider or diabetes educator can help create an activity plan that works for you. General recommendations include:  Encouraging children to engage in at least 60 minutes of physical activity each day.  Stretching and performing strength training exercises, such as yoga or weight lifting, at least 2 times per week.  Performing a total of at least 150 minutes of moderate-intensity exercise each week, such as brisk walking or water aerobics.  Exercising at least 3 days per week, making sure you allow no more than 2 consecutive days to pass without exercising.  Avoiding long periods of inactivity (90 minutes or more). When you have to spend an extended period of time sitting down, take frequent breaks to walk or stretch. RECOMMENDATIONS FOR EXERCISING WITH TYPE 1 OR TYPE 2 DIABETES   Check your blood glucose before exercising. If blood glucose levels are greater than 240 mg/dL, check for urine ketones. Do not exercise if ketones are present.  Avoid injecting insulin into areas of the body that are going to be exercised. For example, avoid injecting insulin into:  The arms when playing tennis.  The legs when jogging.  Keep a record of:  Food intake before and after you exercise.  Expected peak times of insulin action.  Blood glucose levels before and after you exercise.  The type and amount of exercise you have done.  Review your records with your health care provider. Your health care provider will help you to develop guidelines for adjusting food intake and insulin amounts before and after exercising.  If you take insulin or oral hypoglycemic agents, watch for signs and symptoms of hypoglycemia. They include:  Dizziness.  Shaking.  Sweating.  Chills.  Confusion.  Drink plenty of water while you exercise to prevent dehydration or heat stroke. Body water is lost during exercise and must be replaced.  Talk to your health care provider before starting an exercise program  to make sure it is safe for you. Remember, almost any type of activity is better than none.   This information is not intended to replace advice given to you by your health care provider. Make sure you discuss any questions you have with your health care provider.   Document Released: 04/11/2003 Document Revised: 06/05/2014 Document Reviewed: 06/28/2012 Elsevier Interactive Patient Education 2016 [employer] Inc. Basic Carbohydrate Counting for Diabetes Mellitus Carbohydrate counting is a method for keeping track of the amount of carbohydrates you eat. Eating carbohydrates naturally increases the level of sugar (glucose) in your blood, so it is important for you to know the amount that is okay for you to have in every meal. Carbohydrate counting helps keep the level of glucose in your blood within normal limits. The amount of carbohydrates allowed is different for every person. A dietitian can help you calculate the amount that is right for you. Once you know the amount of carbohydrates you can have, you can count the carbohydrates in the foods you want to eat. Carbohydrates are found in the following foods:  Grains, such as breads and cereals.  Dried beans and soy products.  Starchy vegetables, such as potatoes, peas, and corn.  Fruit and fruit juices.  Milk and  yogurt.  Sweets and snack foods, such as cake, cookies, candy, chips, soft drinks, and fruit drinks. CARBOHYDRATE COUNTING There are two ways to count the carbohydrates in your food. You can use either of the methods or a combination of both. Reading the "Nutrition Facts" on Packaged Food The "Nutrition Facts" is an area that is included on the labels of almost all packaged food and beverages in the [place]. It includes the serving size of that food or beverage and information about the nutrients in each serving of the food, including the grams (g) of carbohydrate per serving.  Decide the number of servings of this food or  beverage that you will be able to eat or drink. Multiply that number of servings by the number of grams of carbohydrate that is listed on the label for that serving. The total will be the amount of carbohydrates you will be having when you eat or drink this food or beverage. Learning Standard Serving Sizes of Food When you eat food that is not packaged or does not include "Nutrition Facts" on the label, you need to measure the servings in order to count the amount of carbohydrates.A serving of most carbohydrate-rich foods contains about 15 g of carbohydrates. The following list includes serving sizes of carbohydrate-rich foods that provide 15 g ofcarbohydrate per serving:   1 slice of bread (1 oz) or 1 six-inch tortilla.    of a hamburger bun or English muffin.  4-6 crackers.   cup unsweetened dry cereal.    cup hot cereal.   cup rice or pasta.    cup mashed potatoes or  of a large baked potato.  1 cup fresh fruit or one small piece of fruit.    cup canned or frozen fruit or fruit juice.  1 cup milk.   cup plain fat-free yogurt or yogurt sweetened with artificial sweeteners.   cup cooked dried beans or starchy vegetable, such as peas, corn, or potatoes.  Decide the number of standard-size servings that you will eat. Multiply that number of servings by 15 (the grams of carbohydrates in that serving). For example, if you eat 2 cups of strawberries, you will have eaten 2 servings and 30 g of carbohydrates (2 servings x 15 g = 30 g). For foods such as soups and casseroles, in which more than one food is mixed in, you will need to count the carbohydrates in each food that is included. EXAMPLE OF CARBOHYDRATE COUNTING Sample Dinner  3 oz chicken breast.   cup of brown rice.   cup of corn.  1 cup milk.   1 cup strawberries with sugar-free whipped topping.  Carbohydrate Calculation Step 1: Identify the foods that contain carbohydrates:   Rice.   Corn.    Milk.   Strawberries. Step 2:Calculate the number of servings eaten of each:   2 servings of rice.   1 serving of corn.   1 serving of milk.   1 serving of strawberries. Step 3: Multiply each of those number of servings by 15 g:   2 servings of rice x 15 g = 30 g.   1 serving of corn x 15 g = 15 g.   1 serving of milk x 15 g = 15 g.   1 serving of strawberries x 15 g = 15 g. Step 4: Add together all of the amounts to find the total grams of carbohydrates eaten: 30 g + 15 g + 15 g + 15 g = 75  g.   This information is not intended to replace advice given to you by your health care provider. Make sure you discuss any questions you have with your health care provider.   Document Released: 01/19/2005 Document Revised: 02/09/2014 Document Reviewed: 12/16/2012 Elsevier Interactive Patient Education [employer]

## 2015-06-18 ENCOUNTER — Encounter: Payer: Self-pay | Admitting: Physical Therapy

## 2015-06-18 ENCOUNTER — Ambulatory Visit: Payer: Medicaid Other | Attending: Specialist | Admitting: Physical Therapy

## 2015-06-18 DIAGNOSIS — R262 Difficulty in walking, not elsewhere classified: Secondary | ICD-10-CM | POA: Insufficient documentation

## 2015-06-18 DIAGNOSIS — M6281 Muscle weakness (generalized): Secondary | ICD-10-CM | POA: Insufficient documentation

## 2015-06-18 NOTE — Therapy (Signed)
[facility] [address] [place], [place], [address] Phone: [phone]   Fax:  [phone]  Physical Therapy Evaluation  Patient Details  Name: Stacy [name] MRN: [id number] Date of Birth: 04/16/1960 Referring Provider: Otelia SergeantNitka  Encounter Date: 06/18/2015      PT End of Session - 06/18/15 0803    Visit Number 1   Date for PT Re-Evaluation 08/19/15   Authorization Type Medicaid   PT Start Time 0747   PT Stop Time 0830   PT Time Calculation (min) 43 min   Equipment Utilized During Treatment Back brace   Activity Tolerance Patient limited by pain;Patient limited by fatigue   Behavior [facility] for tasks assessed/performed      Past Medical History  Diagnosis Date  . Hypertension     Pt on lisinopril  . GERD (gastroesophageal reflux disease)     Pt on Protonix daily  . Hyperlipidemia   . Asthma   . COPD (chronic obstructive pulmonary disease) (HCC)   . Shortness of breath     occasional - uses breathing tx at home  . Headache(784.0)     otc meds prn  . Irritable bowel syndrome 11/19/2010  . Diabetes mellitus     Type II  . Neuropathy (HCC)   . Arthritis   . Gout     Past Surgical History  Procedure Laterality Date  . Cholecystectomy    . Svd       x 2  . Tubal ligation    . Endometrial ablation  10/2010  . Upper gastrointestinal endoscopy  04/28/11  . Hernia repair      umbicial hernia  . Lumbar disc surgery  06/03/2015    L 2  L3 L4 L5   . Lumbar laminectomy/decompression microdiscectomy N/A 06/03/2015    Procedure: Bilateral lateral recess decompression L2-3, L3-4, L4-5;  Surgeon: Kerrin ChampagneJames E Nitka, MD;  Location: [facility] OR;  Service: Orthopedics;  Laterality: N/A;    There were no vitals filed for this visit.       Subjective Assessment - 06/18/15 0745    Subjective Patient reports that she underwent a lumbar decompression and discectomy on Jun 03, 2015.  She was having significant back pain  and some difficulty with walking and ADL's.  She reports that she was in the hospital for a week due to inability to walk.   Limitations Lifting;Standing   How long can you walk comfortably? 75 feet max before she needs to rest.   Patient Stated Goals walk and have no pain   Currently in Pain? Yes   Pain Score 3    Pain Location Back   Pain Orientation Lower   Pain Descriptors / Indicators Aching;Throbbing   Pain Type Chronic pain   Pain Onset More than a month ago   Pain Frequency Intermittent   Aggravating Factors  walking, standing   Pain Relieving Factors pain meds and rest   Effect of Pain on Daily Activities dificulty with ADL's            [facility] PT Assessment - 06/18/15 0001    Assessment   Medical Diagnosis s/p lumbar decompression L3-L5   Referring Provider Nitka   Onset Date/Surgical Date 06/03/15   Prior Therapy in the hospital   Precautions   Precaution Comments no bending or twisting   Balance Screen   Has the patient fallen in the past 6 months Yes   How many times? 20  these  are from legs buckling and giving out   Has the patient had a decrease in activity level because of a fear of falling?  Yes   Is the patient reluctant to leave their home because of a fear of falling?  Yes   Home Environment   Additional Comments has some stairs at home   Prior Function   Level of Independence Independent with household mobility with device   Vocation On disability   Leisure no exercise   ROM / Strength   AROM / PROM / Strength AROM;Strength   AROM   Overall AROM Comments LE ROM is WFL's but very slow and gaurded.  Lumbar ROM is decreased 100% due to fear, and MD restriction for no bending   Strength   Overall Strength Comments 3+/5 with some pain in the low back with the resisted testing   Flexibility   Soft Tissue Assessment /Muscle Length --  very tight in the calves, HS and pirifromis   Palpation   Palpation comment she is tight and tender in the buttocks,  lumbar parapsinals and even into the upper traps    Ambulation/Gait   Gait Comments uses a FWW, very slow gait, slight antalgic gait on the right, after she fatigued the feet tended to have toe drag and get tangled up, she had legs buckle 3 times with the need for max assist to keep from falling in 30 feet of ambulation.   Standardized Balance Assessment   Standardized Balance Assessment Timed Up and Go Test   Timed Up and Go Test   Normal TUG (seconds) 55                           PT Education - 06/18/15 0802    Education provided Yes   Education Details Wms flexion for gentle unweighted ROM, supine marching   Person(s) Educated Patient   Methods Explanation;Demonstration;Handout   Comprehension Verbalized understanding          PT Short Term Goals - 06/18/15 0835    PT SHORT TERM GOAL #1   Title I with initial HEP   Time 2   Period Weeks   Status New           PT Long Term Goals - 06/18/15 0836    PT LONG TERM GOAL #1   Title I with advanced HEP   Time 8   Period Weeks   Status New   PT LONG TERM GOAL #2   Title no falls in previous 4 weeks   Time 8   Period Weeks   Status New   PT LONG TERM GOAL #3   Title decreased pain 50% with ADLs   Time 8   Period Weeks   Status New   PT LONG TERM GOAL #4   Title improved BLE strength to 4+/5 or better to improve function   Time 8   Period Weeks   Status New               Plan - 06/18/15 0804    Clinical Impression Statement Patient had LE weakness and difficulty walking starting about a year ago, an MRI and NCV test revealed nerve damage from the back, she underwent a 3 level decompression on Jun 03, 2015.  She is very weak and has difficulty walking > 75 feet.  Has to use a FWW, legs tend to buckle with toes catching during gait especially when she fatigued.  Rehab Potential Good   PT Frequency 1x / week   PT Duration 12 weeks   PT Treatment/Interventions ADLs/Self Care Home  Management;Electrical Stimulation;Moist Heat;Therapeutic exercise;Balance training;Neuromuscular re-education;Patient/family education;Gait training;Manual techniques;Passive range of motion   PT Next Visit Plan Medicaid will allow 3 visits, we will try to space these out to get the maximum benefit.   Consulted and Agree with Plan of Care Patient      Patient will benefit from skilled therapeutic intervention in order to improve the following deficits and impairments:  Abnormal gait, Cardiopulmonary status limiting activity, Decreased activity tolerance, Decreased mobility, Decreased range of motion, Decreased strength, Difficulty walking, Increased muscle spasms, Impaired flexibility, Improper body mechanics, Postural dysfunction, Pain  Visit Diagnosis: Difficulty in walking, not elsewhere classified - Plan: PT plan of care cert/re-cert  Muscle weakness (generalized) - Plan: PT plan of care cert/re-cert     Problem List Patient Active Problem List   Diagnosis Date Noted  . Type 2 diabetes mellitus without complication, without long-term current use of insulin (HCC) 06/13/2015  . Neurogenic claudication due to lumbar spinal stenosis 06/03/2015    Class: Chronic  . Chondromalacia of both patellae 06/03/2015    Class: Chronic  . Spinal stenosis, lumbar region, with neurogenic claudication 06/03/2015  . Tobacco use disorder 04/25/2015  . DJD (degenerative joint disease) of knee 01/04/2015  . Falls frequently 11/29/2014  . Hemorrhoid 11/14/2014  . Bilateral knee pain 07/31/2014  . Asthma, chronic 07/19/2014  . Gout of big toe 07/19/2014  . Midline low back pain with right-sided sciatica 01/01/2014  . Bilateral low back pain without sciatica 08/14/2013  . Essential hypertension 08/14/2013  . Gastroesophageal reflux disease without esophagitis 08/14/2013  . COPD exacerbation (HCC) 08/14/2013  . Colon cancer screening 08/14/2013  . Low back pain 05/15/2013  . Preventative health care  05/15/2013  . GERD (gastroesophageal reflux disease) 02/16/2013  . DM (diabetes mellitus) (HCC) 02/16/2013  . COPD (chronic obstructive pulmonary disease) (HCC) 04/17/2011  . Hypertension 04/17/2011  . Cough 04/16/2011    Jearld Lesch., PT 06/18/2015, 8:38 AM  [facility] [address] [place], [place], [address] Phone: [phone]   Fax:  [phone]  Name: Haizley Cannella MRN: [id number] Date of Birth: [birth year]

## 2015-06-25 ENCOUNTER — Telehealth: Payer: Self-pay

## 2015-06-25 NOTE — Telephone Encounter (Signed)
06/25/15 patient cxl PT visits due to limit of visits (3) with Medicaid. She reports that after seeing MD yesterday she will be having knee surgery soon and wants to hold PT visits until then

## 2015-06-26 ENCOUNTER — Ambulatory Visit: Payer: Medicaid Other | Admitting: Physical Therapy

## 2015-07-09 ENCOUNTER — Telehealth: Payer: Self-pay | Admitting: *Deleted

## 2015-07-09 NOTE — Telephone Encounter (Signed)
-----   Message from Quentin Angstlugbemiga E Jegede, MD sent at 06/03/2015  1:00 PM EDT ----- Please inform patient that her laboratory results are mostly within normal.

## 2015-07-09 NOTE — Telephone Encounter (Signed)
Patient verified DOB Patient is aware of lab results being mostly normal and patient is aware of potassium level being back to normal. Patients states she was started on an iron supplement recently. No further questions at this time.

## 2015-07-18 ENCOUNTER — Encounter: Payer: Self-pay | Admitting: Internal Medicine

## 2015-07-18 ENCOUNTER — Ambulatory Visit: Payer: Medicaid Other | Attending: Internal Medicine | Admitting: Internal Medicine

## 2015-07-18 VITALS — BP 106/72 | HR 94 | Temp 98.2°F | Resp 18 | Ht 66.0 in | Wt 166.0 lb

## 2015-07-18 DIAGNOSIS — R51 Headache: Secondary | ICD-10-CM | POA: Diagnosis not present

## 2015-07-18 DIAGNOSIS — M109 Gout, unspecified: Secondary | ICD-10-CM | POA: Diagnosis not present

## 2015-07-18 DIAGNOSIS — Z888 Allergy status to other drugs, medicaments and biological substances status: Secondary | ICD-10-CM | POA: Insufficient documentation

## 2015-07-18 DIAGNOSIS — Z886 Allergy status to analgesic agent status: Secondary | ICD-10-CM | POA: Insufficient documentation

## 2015-07-18 DIAGNOSIS — M545 Low back pain, unspecified: Secondary | ICD-10-CM

## 2015-07-18 DIAGNOSIS — F17200 Nicotine dependence, unspecified, uncomplicated: Secondary | ICD-10-CM | POA: Insufficient documentation

## 2015-07-18 DIAGNOSIS — I10 Essential (primary) hypertension: Secondary | ICD-10-CM | POA: Diagnosis not present

## 2015-07-18 DIAGNOSIS — E1140 Type 2 diabetes mellitus with diabetic neuropathy, unspecified: Secondary | ICD-10-CM | POA: Insufficient documentation

## 2015-07-18 DIAGNOSIS — Z9109 Other allergy status, other than to drugs and biological substances: Secondary | ICD-10-CM | POA: Insufficient documentation

## 2015-07-18 DIAGNOSIS — E785 Hyperlipidemia, unspecified: Secondary | ICD-10-CM | POA: Insufficient documentation

## 2015-07-18 DIAGNOSIS — R0602 Shortness of breath: Secondary | ICD-10-CM | POA: Insufficient documentation

## 2015-07-18 DIAGNOSIS — Z716 Tobacco abuse counseling: Secondary | ICD-10-CM | POA: Diagnosis not present

## 2015-07-18 DIAGNOSIS — E119 Type 2 diabetes mellitus without complications: Secondary | ICD-10-CM | POA: Diagnosis not present

## 2015-07-18 DIAGNOSIS — K589 Irritable bowel syndrome without diarrhea: Secondary | ICD-10-CM | POA: Insufficient documentation

## 2015-07-18 DIAGNOSIS — K219 Gastro-esophageal reflux disease without esophagitis: Secondary | ICD-10-CM | POA: Diagnosis not present

## 2015-07-18 DIAGNOSIS — J441 Chronic obstructive pulmonary disease with (acute) exacerbation: Secondary | ICD-10-CM | POA: Diagnosis not present

## 2015-07-18 LAB — GLUCOSE, POCT (MANUAL RESULT ENTRY): POC Glucose: 99 mg/dl (ref 70–99)

## 2015-07-18 MED ORDER — MOMETASONE FURO-FORMOTEROL FUM 100-5 MCG/ACT IN AERO: 2.0000 | INHALATION_SPRAY | Freq: Two times a day (BID) | RESPIRATORY_TRACT | Status: DC

## 2015-07-18 MED ORDER — ACETAMINOPHEN-CODEINE #3 300-30 MG PO TABS: 1.0000 | ORAL_TABLET | ORAL | Status: DC | PRN

## 2015-07-18 NOTE — Progress Notes (Signed)
Patient ID: Stacy [name], female   DOB: March 16, 1960, 55 y.o.   MRN: [id number]   Stacy [name], is a 55 y.o. female  [id number]  [id number]  DOB - March 16, 1960  Chief Complaint  Patient presents with  . Hypertension        Subjective:   Stacy [name] is a 55 y.o. female with history of hypertension, GERD, gouty arthritis, bilateral primary osteoarthritis of the knees, type 2 diabetes mellitus controlled, hyperlipidemia, COPD with ongoing tobacco use and chronic back pain here today for a follow up visit. Patient recently had laminectomy/decompression microdiscectomy for spinal stenosis, awaiting knee replacements surgery. She is currently undergoing rehabilitation, she is doing significantly well, she claims her pain is much reduced, she wears no braces anymore, she is ambulating well without walker as before. She has no new complaint today but she will still need a refill of her Tylenol 3 to keep her from breakthrough pain. Her blood pressure and blood sugar are both controlled, previous hemoglobin A1c was 7.0%. Patient has No headache, No chest pain, No abdominal pain - No Nausea, No new weakness tingling or numbness, No Cough - SOB.  No problems updated.  ALLERGIES: Allergies  Allergen Reactions  . Other Shortness Of Breath    Allergic to perfumes and cleaning products  . Aspirin Nausea Only    unknown  . Ace Inhibitors Other (See Comments)    Cough, wheeze    PAST MEDICAL HISTORY: Past Medical History  Diagnosis Date  . Hypertension     Pt on lisinopril  . GERD (gastroesophageal reflux disease)     Pt on Protonix daily  . Hyperlipidemia   . Asthma   . COPD (chronic obstructive pulmonary disease) (HCC)   . Shortness of breath     occasional - uses breathing tx at home  . Headache(784.0)     otc meds prn  . Irritable bowel syndrome 11/19/2010  . Diabetes mellitus     Type II  . Neuropathy (HCC)   . Arthritis   . Gout     MEDICATIONS AT HOME: Prior to  Admission medications   Medication Sig Start Date End Date Taking? Authorizing Provider  acetaminophen-codeine (TYLENOL #3) 300-30 MG tablet Take 1 tablet by mouth every 4 (four) hours as needed. 07/18/15  Yes Quentin Angstlugbemiga E Delbert Darley, MD  albuterol (PROVENTIL) (2.5 MG/3ML) 0.083% nebulizer solution Take 3 mLs (2.5 mg total) by nebulization every 4 (four) hours as needed for wheezing. 04/25/15  Yes Quentin Angstlugbemiga E Aqib Lough, MD  allopurinol (ZYLOPRIM) 100 MG tablet Take 1 tablet (100 mg total) by mouth daily. 05/30/15  Yes Quentin Angstlugbemiga E Tirth Cothron, MD  Blood Glucose Monitoring Suppl (TRUE METRIX METER) DEVI 1 Device by Does not apply route 4 (four) times daily -  before meals and at bedtime. 11/14/14  Yes Jaclyn ShaggyEnobong Amao, MD  budesonide-formoterol (SYMBICORT) 80-4.5 MCG/ACT inhaler Inhale 2 puffs into the lungs 2 (two) times daily. 05/30/15 05/29/16 Yes Denijah Karrer Annitta NeedsE Lean Jaeger, MD  colchicine 0.6 MG tablet Take 1 tablet (0.6 mg total) by mouth daily. 05/30/15  Yes Quentin Angstlugbemiga E Jorah Hua, MD  cyclobenzaprine (FLEXERIL) 5 MG tablet Take 1 tablet (5 mg total) by mouth 3 (three) times daily as needed for muscle spasms. 06/07/15  Yes Kerrin ChampagneJames E Nitka, MD  docusate sodium (COLACE) 100 MG capsule Take 1 capsule (100 mg total) by mouth 2 (two) times daily. 06/07/15  Yes Kerrin ChampagneJames E Nitka, MD  EPINEPHrine 0.3 mg/0.3 mL IJ SOAJ injection Inject 0.3 mLs (0.3 mg total) into the muscle  once. 09/10/14  Yes Breely Panik Annitta Needs, MD  gabapentin (NEURONTIN) 300 MG capsule Take 1 capsule (300 mg total) by mouth 3 (three) times daily. 02/21/15  Yes Quentin Angst, MD  glucose blood (ACCU-CHEK AVIVA) test strip Use as instructed 02/22/15  Yes Maven Varelas E Rylei Codispoti, MD  glucose blood (TRUE METRIX BLOOD GLUCOSE TEST) test strip USE AS DIRECTED BY PHYSICIAN 07/31/14  Yes Quentin Angst, MD  hydrochlorothiazide (HYDRODIURIL) 25 MG tablet Take 1 tablet (25 mg total) by mouth daily. 04/25/15  Yes Quentin Angst, MD  hydrocortisone-pramoxine [facility]) 2.5-1 %  rectal cream Place 1 application rectally 2 (two) times daily. 11/16/14  Yes Jaclyn Shaggy, MD  hydrOXYzine (ATARAX/VISTARIL) 10 MG tablet Take 1 tablet (10 mg total) by mouth 3 (three) times daily as needed. 02/21/15  Yes Quentin Angst, MD  Lancets (ACCU-CHEK SOFT TOUCH) lancets Use as instructed 02/22/15  Yes Quentin Angst, MD  metFORMIN (GLUCOPHAGE-XR) 750 MG 24 hr tablet Take 1 tablet (750 mg total) by mouth daily with breakfast. 02/21/15  Yes Quentin Angst, MD  methocarbamol (ROBAXIN) 500 MG tablet Take 1 tablet (500 mg total) by mouth every 6 (six) hours as needed for muscle spasms. 06/06/15  Yes Naida Sleight, PA-C  pantoprazole (PROTONIX) 40 MG tablet Take 1 tablet (40 mg total) by mouth daily. 02/21/15  Yes Quentin Angst, MD  potassium chloride SA (K-DUR,KLOR-CON) 20 MEQ tablet Take 1 tablet (20 mEq total) by mouth daily. 06/07/15  Yes Kerrin Champagne, MD  simvastatin (ZOCOR) 20 MG tablet Take 1 tablet (20 mg total) by mouth at bedtime. 02/21/15  Yes Quentin Angst, MD  Vitamin D, Ergocalciferol, (DRISDOL) 50000 UNITS CAPS capsule Take 1 capsule (50,000 Units total) by mouth every 7 (seven) weeks. Sundays 10/11/14  Yes Quentin Angst, MD  mometasone-formoterol (DULERA) 100-5 MCG/ACT AERO Inhale 2 puffs into the lungs 2 (two) times daily. 07/18/15   Quentin Angst, MD     Objective:   Filed Vitals:   07/18/15 1601  BP: 106/72  Pulse: 94  Temp: 98.2 F (36.8 C)  TempSrc: Oral  Resp: 18  Height: 5\' 6"  (1.676 m)  Weight: 166 lb (75.297 kg)  SpO2: 96%    Exam General appearance : Awake, alert, not in any distress. Speech Clear. Not toxic looking HEENT: Atraumatic and Normocephalic, pupils equally reactive to light and accomodation Neck: Supple, no JVD. No cervical lymphadenopathy.  Chest: Good air entry bilaterally, no added sounds  CVS: S1 S2 regular, no murmurs.  Abdomen: Bowel sounds present, Non tender and not distended with no gaurding, rigidity or  rebound. Extremities: B/L Lower Ext shows no edema, both legs are warm to touch Neurology: Awake alert, and oriented X 3, CN II-XII intact, Non focal Skin: No Rash  Data Review Lab Results  Component Value Date   HGBA1C 7.0* 06/03/2015   HGBA1C 6.7 05/30/2015   HGBA1C 6.30 02/21/2015     Assessment & Plan   1. Type 2 diabetes mellitus without complication, without long-term current use of insulin (HCC)  - Glucose (CBG)  - Microalbumin/Creatinine Ratio, Urine Aim for 30 minutes of exercise most days. Rethink what you drink. Water is great! Aim for 2-3 Carb Choices per meal (30-45 grams) +/- 1 either way  Aim for 0-15 Carbs per snack if hungry  Include protein in moderation with your meals and snacks  Consider reading food labels for Total Carbohydrate and Fat Grams of foods  Consider checking BG at  alternate times per day  Continue taking medication as directed Be mindful about how much sugar you are adding to beverages and other foods. Fruit Punch - find one with no sugar  Measure and decrease portions of carbohydrate foods  Make your plate and don't go back for seconds  2. Bilateral low back pain without sciatica  - acetaminophen-codeine (TYLENOL #3) 300-30 MG tablet; Take 1 tablet by mouth every 4 (four) hours as needed.  Dispense: 90 tablet; Refill: 0  3. Essential hypertension  We have discussed target BP range and blood pressure goal. I have advised patient to check BP Korearegularly and to call us back or report to clinic if the numbers are consistently higher than 140/90. We discussed the importance of compliance with medical therapy and DASH diet recommended, consequences of uncontrolled hypertension discussed.  - continue current BP medications  4. Tobacco use disorder  Fayrene was counseled on the dangers of tobacco use, and was advised to quit. Reviewed strategies to maximize success, including removing cigarettes and smoking materials from environment, stress  management and support of family/friends.  5. COPD (HCC): Stable  - Change Symbicort to [facility] because of insurance coverage issues - Stop Smoking  - mometasone-formoterol (DULERA) 100-5 MCG/ACT AERO; Inhale 2 puffs into the lungs 2 (two) times daily.  Dispense: 1 Inhaler; Refill: 3  Patient have been counseled extensively about nutrition and exercise  Return in about 3 months (around 10/18/2015) for Pap Smear.  The patient was given clear instructions to go to ER or return to medical center if symptoms don't improve, worsen or new problems develop. The patient verbalized understanding. The patient was told to call to get lab results if they haven't heard anything in the next week.   This note has been created with Education officer, environmental. Any transcriptional errors are unintentional.    Jeanann Lewandowsky, MD, MHA, Maxwell Caul, CPE [facility] [place], [place] [phone]   07/18/2015, 4:47 PM

## 2015-07-18 NOTE — Progress Notes (Signed)
Patient is here for FU HTN  Patient presented to the clinic in good spirits. Patient was not using any gait assistant devices.   Patient complains of mild leg pain scaled currently at a 4.  Patient has taken medication today and patient has eaten today.

## 2015-07-18 NOTE — Patient Instructions (Signed)
Smoking Cessation, Tips for Success If you are ready to quit smoking, congratulations! You have chosen to help yourself be healthier. Cigarettes bring nicotine, tar, carbon monoxide, and other irritants into your body. Your lungs, heart, and blood vessels will be able to work better without these poisons. There are many different ways to quit smoking. Nicotine gum, nicotine patches, a nicotine inhaler, or nicotine nasal spray can help with physical craving. Hypnosis, support groups, and medicines help break the habit of smoking. WHAT THINGS CAN I DO TO MAKE QUITTING EASIER?  Here are some tips to help you quit for good:  Pick a date when you will quit smoking completely. Tell all of your friends and family about your plan to quit on that date.  Do not try to slowly cut down on the number of cigarettes you are smoking. Pick a quit date and quit smoking completely starting on that day.  Throw away all cigarettes.   Clean and remove all ashtrays from your home, work, and car.  On a card, write down your reasons for quitting. Carry the card with you and read it when you get the urge to smoke.  Cleanse your body of nicotine. Drink enough water and fluids to keep your urine clear or pale yellow. Do this after quitting to flush the nicotine from your body.  Learn to predict your moods. Do not let a bad situation be your excuse to have a cigarette. Some situations in your life might tempt you into wanting a cigarette.  Never have "just one" cigarette. It leads to wanting another and another. Remind yourself of your decision to quit.  Change habits associated with smoking. If you smoked while driving or when feeling stressed, try other activities to replace smoking. Stand up when drinking your coffee. Brush your teeth after eating. Sit in a different chair when you read the paper. Avoid alcohol while trying to quit, and try to drink fewer caffeinated beverages. Alcohol and caffeine may urge you to  smoke.  Avoid foods and drinks that can trigger a desire to smoke, such as sugary or spicy foods and alcohol.  Ask people who smoke not to smoke around you.  Have something planned to do right after eating or having a cup of coffee. For example, plan to take a walk or exercise.  Try a relaxation exercise to calm you down and decrease your stress. Remember, you may be tense and nervous for the first 2 weeks after you quit, but this will pass.  Find new activities to keep your hands busy. Play with a pen, coin, or rubber band. Doodle or draw things on paper.  Brush your teeth right after eating. This will help cut down on the craving for the taste of tobacco after meals. You can also try mouthwash.   Use oral substitutes in place of cigarettes. Try using lemon drops, carrots, cinnamon sticks, or chewing gum. Keep them handy so they are available when you have the urge to smoke.  When you have the urge to smoke, try deep breathing.  Designate your home as a nonsmoking area.  If you are a heavy smoker, ask your health care provider about a prescription for nicotine chewing gum. It can ease your withdrawal from nicotine.  Reward yourself. Set aside the cigarette money you save and buy yourself something nice.  Look for support from others. Join a support group or smoking cessation program. Ask someone at home or at work to help you with your plan   to quit smoking.  Always ask yourself, "Do I need this cigarette or is this just a reflex?" Tell yourself, "Today, I choose not to smoke," or "I do not want to smoke." You are reminding yourself of your decision to quit.  Do not replace cigarette smoking with electronic cigarettes (commonly called e-cigarettes). The safety of e-cigarettes is unknown, and some may contain harmful chemicals.  If you relapse, do not give up! Plan ahead and think about what you will do the next time you get the urge to smoke. HOW WILL I FEEL WHEN I QUIT SMOKING? You  may have symptoms of withdrawal because your body is used to nicotine (the addictive substance in cigarettes). You may crave cigarettes, be irritable, feel very hungry, cough often, get headaches, or have difficulty concentrating. The withdrawal symptoms are only temporary. They are strongest when you first quit but will go away within 10-14 days. When withdrawal symptoms occur, stay in control. Think about your reasons for quitting. Remind yourself that these are signs that your body is healing and getting used to being without cigarettes. Remember that withdrawal symptoms are easier to treat than the major diseases that smoking can cause.  Even after the withdrawal is over, expect periodic urges to smoke. However, these cravings are generally short lived and will go away whether you smoke or not. Do not smoke! WHAT RESOURCES ARE AVAILABLE TO HELP ME QUIT SMOKING? Your health care provider can direct you to community resources or hospitals for support, which may include:  Group support.  Education.  Hypnosis.  Therapy.   This information is not intended to replace advice given to you by your health care provider. Make sure you discuss any questions you have with your health care provider.   Document Released: 10/18/2003 Document Revised: 02/09/2014 Document Reviewed: 07/07/2012 Elsevier Interactive Patient Education 2016 [employer]. Diabetes and Exercise Exercising regularly is important. It is not just about losing weight. It has many health benefits, such as:  Improving your overall fitness, flexibility, and endurance.  Increasing your bone density.  Helping with weight control.  Decreasing your body fat.  Increasing your muscle strength.  Reducing stress and tension.  Improving your overall health. People with diabetes who exercise gain additional benefits because exercise:  Reduces appetite.  Improves the body's use of blood sugar (glucose).  Helps lower or control  blood glucose.  Decreases blood pressure.  Helps control blood lipids (such as cholesterol and triglycerides).  Improves the body's use of the hormone insulin by:  Increasing the body's insulin sensitivity.  Reducing the body's insulin needs.  Decreases the risk for heart disease because exercising:  Lowers cholesterol and triglycerides levels.  Increases the levels of good cholesterol (such as high-density lipoproteins [HDL]) in the body.  Lowers blood glucose levels. YOUR ACTIVITY PLAN  Choose an activity that you enjoy, and set realistic goals. To exercise safely, you should begin practicing any new physical activity slowly, and gradually increase the intensity of the exercise over time. Your health care provider or diabetes educator can help create an activity plan that works for you. General recommendations include:  Encouraging children to engage in at least 60 minutes of physical activity each day.  Stretching and performing strength training exercises, such as yoga or weight lifting, at least 2 times per week.  Performing a total of at least 150 minutes of moderate-intensity exercise each week, such as brisk walking or water aerobics.  Exercising at least 3 days per week, making sure you  allow no more than 2 consecutive days to pass without exercising.  Avoiding long periods of inactivity (90 minutes or more). When you have to spend an extended period of time sitting down, take frequent breaks to walk or stretch. RECOMMENDATIONS FOR EXERCISING WITH TYPE 1 OR TYPE 2 DIABETES   Check your blood glucose before exercising. If blood glucose levels are greater than 240 mg/dL, check for urine ketones. Do not exercise if ketones are present.  Avoid injecting insulin into areas of the body that are going to be exercised. For example, avoid injecting insulin into:  The arms when playing tennis.  The legs when jogging.  Keep a record of:  Food intake before and after you  exercise.  Expected peak times of insulin action.  Blood glucose levels before and after you exercise.  The type and amount of exercise you have done.  Review your records with your health care provider. Your health care provider will help you to develop guidelines for adjusting food intake and insulin amounts before and after exercising.  If you take insulin or oral hypoglycemic agents, watch for signs and symptoms of hypoglycemia. They include:  Dizziness.  Shaking.  Sweating.  Chills.  Confusion.  Drink plenty of water while you exercise to prevent dehydration or heat stroke. Body water is lost during exercise and must be replaced.  Talk to your health care provider before starting an exercise program to make sure it is safe for you. Remember, almost any type of activity is better than none.   This information is not intended to replace advice given to you by your health care provider. Make sure you discuss any questions you have with your health care provider.   Document Released: 04/11/2003 Document Revised: 06/05/2014 Document Reviewed: 06/28/2012 Elsevier Interactive Patient Education 2016 [place] Carbohydrate Counting for Diabetes Mellitus Carbohydrate counting is a method for keeping track of the amount of carbohydrates you eat. Eating carbohydrates naturally increases the level of sugar (glucose) in your blood, so it is important for you to know the amount that is okay for you to have in every meal. Carbohydrate counting helps keep the level of glucose in your blood within normal limits. The amount of carbohydrates allowed is different for every person. A dietitian can help you calculate the amount that is right for you. Once you know the amount of carbohydrates you can have, you can count the carbohydrates in the foods you want to eat. Carbohydrates are found in the following foods:  Grains, such as breads and cereals.  Dried beans and soy  products.  Starchy vegetables, such as potatoes, peas, and corn.  Fruit and fruit juices.  Milk and yogurt.  Sweets and snack foods, such as cake, cookies, candy, chips, soft drinks, and fruit drinks. CARBOHYDRATE COUNTING There are two ways to count the carbohydrates in your food. You can use either of the methods or a combination of both. Reading the "Nutrition Facts" on [place] The "Nutrition Facts" is an area that is included on the labels of almost all packaged food and beverages in the [place]. It includes the serving size of that food or beverage and information about the nutrients in each serving of the food, including the grams (g) of carbohydrate per serving.  Decide the number of servings of this food or beverage that you will be able to eat or drink. Multiply that number of servings by the number of grams of carbohydrate that is listed on the label  for that serving. The total will be the amount of carbohydrates you will be having when you eat or drink this food or beverage. Learning Standard Serving Sizes of Food When you eat food that is not packaged or does not include "Nutrition Facts" on the label, you need to measure the servings in order to count the amount of carbohydrates.A serving of most carbohydrate-rich foods contains about 15 g of carbohydrates. The following list includes serving sizes of carbohydrate-rich foods that provide 15 g ofcarbohydrate per serving:   1 slice of bread (1 oz) or 1 six-inch tortilla.    of a hamburger bun or English muffin.  4-6 crackers.   cup unsweetened dry cereal.    cup hot cereal.   cup rice or pasta.    cup mashed potatoes or  of a large baked potato.  1 cup fresh fruit or one small piece of fruit.    cup canned or frozen fruit or fruit juice.  1 cup milk.   cup plain fat-free yogurt or yogurt sweetened with artificial sweeteners.   cup cooked dried beans or starchy vegetable, such as peas, corn,  or potatoes.  Decide the number of standard-size servings that you will eat. Multiply that number of servings by 15 (the grams of carbohydrates in that serving). For example, if you eat 2 cups of strawberries, you will have eaten 2 servings and 30 g of carbohydrates (2 servings x 15 g = 30 g). For foods such as soups and casseroles, in which more than one food is mixed in, you will need to count the carbohydrates in each food that is included. EXAMPLE OF CARBOHYDRATE COUNTING Sample Dinner  3 oz chicken breast.   cup of brown rice.   cup of corn.  1 cup milk.   1 cup strawberries with sugar-free whipped topping.  Carbohydrate Calculation Step 1: Identify the foods that contain carbohydrates:   Rice.   Corn.   Milk.   Strawberries. Step 2:Calculate the number of servings eaten of each:   2 servings of rice.   1 serving of corn.   1 serving of milk.   1 serving of strawberries. Step 3: Multiply each of those number of servings by 15 g:   2 servings of rice x 15 g = 30 g.   1 serving of corn x 15 g = 15 g.   1 serving of milk x 15 g = 15 g.   1 serving of strawberries x 15 g = 15 g. Step 4: Add together all of the amounts to find the total grams of carbohydrates eaten: 30 g + 15 g + 15 g + 15 g = 75 g.   This information is not intended to replace advice given to you by your health care provider. Make sure you discuss any questions you have with your health care provider.   Document Released: 01/19/2005 Document Revised: 02/09/2014 Document Reviewed: 12/16/2012 Elsevier Interactive Patient Education 2016 [employer]. Hypertension Hypertension, commonly called high blood pressure, is when the force of blood pumping through your arteries is too strong. Your arteries are the blood vessels that carry blood from your heart throughout your body. A blood pressure reading consists of a higher number over a lower number, such as 110/72. The higher number  (systolic) is the pressure inside your arteries when your heart pumps. The lower number (diastolic) is the pressure inside your arteries when your heart relaxes. Ideally you want your blood pressure below 120/80. Hypertension forces  your heart to work harder to pump blood. Your arteries may become narrow or stiff. Having untreated or uncontrolled hypertension can cause heart attack, stroke, kidney disease, and other problems. RISK FACTORS Some risk factors for high blood pressure are controllable. Others are not.  Risk factors you cannot control include:   Race. You may be at higher risk if you are African American.  Age. Risk increases with age.  Gender. Men are at higher risk than women before age 56 years. After age 56, women are at higher risk than men. Risk factors you can control include:  Not getting enough exercise or physical activity.  Being overweight.  Getting too much fat, sugar, calories, or salt in your diet.  Drinking too much alcohol. SIGNS AND SYMPTOMS Hypertension does not usually cause signs or symptoms. Extremely high blood pressure (hypertensive crisis) may cause headache, anxiety, shortness of breath, and nosebleed. DIAGNOSIS To check if you have hypertension, your health care provider will measure your blood pressure while you are seated, with your arm held at the level of your heart. It should be measured at least twice using the same arm. Certain conditions can cause a difference in blood pressure between your right and left arms. A blood pressure reading that is higher than normal on one occasion does not mean that you need treatment. If it is not clear whether you have high blood pressure, you may be asked to return on a different day to have your blood pressure checked again. Or, you may be asked to monitor your blood pressure at home for 1 or more weeks. TREATMENT Treating high blood pressure includes making lifestyle changes and possibly taking medicine. Living  a healthy lifestyle can help lower high blood pressure. You may need to change some of your habits. Lifestyle changes may include:  Following the DASH diet. This diet is high in fruits, vegetables, and whole grains. It is low in salt, red meat, and added sugars.  Keep your sodium intake below 2,300 mg per day.  Getting at least 30-45 minutes of aerobic exercise at least 4 times per week.  Losing weight if necessary.  Not smoking.  Limiting alcoholic beverages.  Learning ways to reduce stress. Your health care provider may prescribe medicine if lifestyle changes are not enough to get your blood pressure under control, and if one of the following is true:  You are 3918-56 years of age and your systolic blood pressure is above 140.  You are 56 years of age or older, and your systolic blood pressure is above 150.  Your diastolic blood pressure is above 90.  You have diabetes, and your systolic blood pressure is over 140 or your diastolic blood pressure is over 90.  You have kidney disease and your blood pressure is above 140/90.  You have heart disease and your blood pressure is above 140/90. Your personal target blood pressure may vary depending on your medical conditions, your age, and other factors. HOME CARE INSTRUCTIONS  Have your blood pressure rechecked as directed by your health care provider.   Take medicines only as directed by your health care provider. Follow the directions carefully. Blood pressure medicines must be taken as prescribed. The medicine does not work as well when you skip doses. Skipping doses also puts you at risk for problems.  Do not smoke.   Monitor your blood pressure at home as directed by your health care provider. SEEK MEDICAL CARE IF:   You think you are having a reaction  to medicines taken.  You have recurrent headaches or feel dizzy.  You have swelling in your ankles.  You have trouble with your vision. SEEK IMMEDIATE MEDICAL CARE  IF:  You develop a severe headache or confusion.  You have unusual weakness, numbness, or feel faint.  You have severe chest or abdominal pain.  You vomit repeatedly.  You have trouble breathing. MAKE SURE YOU:   Understand these instructions.  Will watch your condition.  Will get help right away if you are not doing well or get worse.   This information is not intended to replace advice given to you by your health care provider. Make sure you discuss any questions you have with your health care provider.   Document Released: 01/19/2005 Document Revised: 06/05/2014 Document Reviewed: 11/11/2012 Elsevier Interactive Patient Education [employer]

## 2015-07-30 ENCOUNTER — Other Ambulatory Visit: Payer: Self-pay | Admitting: Internal Medicine

## 2015-08-02 ENCOUNTER — Other Ambulatory Visit: Payer: Self-pay | Admitting: Internal Medicine

## 2015-08-02 ENCOUNTER — Ambulatory Visit
Admission: RE | Admit: 2015-08-02 | Discharge: 2015-08-02 | Disposition: A | Discharge status: Home / Self Care | Payer: Medicaid Other | Source: Ambulatory Visit

## 2015-08-02 DIAGNOSIS — Z1231 Encounter for screening mammogram for malignant neoplasm of breast: Secondary | ICD-10-CM

## 2015-08-07 ENCOUNTER — Telehealth: Payer: Self-pay | Admitting: *Deleted

## 2015-08-07 NOTE — Telephone Encounter (Signed)
Medical Assistant left message on patient's home and cell voicemail. Voicemail states to give a call back to Nubia with [facility] at [phone].  

## 2015-08-07 NOTE — Telephone Encounter (Signed)
-----   Message from Quentin Angstlugbemiga E Jegede, MD sent at 08/07/2015  1:52 PM EDT ----- Please inform patient that her screening mammogram shows no evidence of malignancy. Recommend screening mammogram in one year

## 2015-08-29 ENCOUNTER — Encounter (HOSPITAL_COMMUNITY): Payer: Self-pay

## 2015-08-29 NOTE — Pre-Procedure Instructions (Addendum)
Stacy [name]  08/29/2015      [facility] - [place], [place] - [address] [place] [address] Phone: [phone] Fax: [phone]  [facility] - [place], [place] - [address] [facility] [address] [place] [address] Phone: [phone] Fax: [phone]  [facility] - [place], [place] - [address] [place] [address] Phone: [phone] Fax: [phone]  [facility] [phone] - [place], [place] - [address] AT [facility] [address] [phone] Stacy [name] [place] [address] Phone: [phone] Fax: [phone]    Your procedure is scheduled on Friday, September 06, 2015  Report to [facility] Admitting at 10:30 A.M.  Call this number if you have problems the morning of surgery:  [phone]   Remember:  Do not eat food or drink liquids after midnight Thursday, September 05, 2015   Take these medicines the morning of surgery with A SIP OF WATER : Allopurinol ( Zyloprim), Colchicine, gabapentin ( Neurontin), Pantoprazole ( Protonix), inhalers, if needed: Tylenol #3 for pain, Nebulizer for wheezing, Epipen Stop taking Aspirin, vitamins, fish oil and herbal medications. Do not take any NSAIDs ie: Ibuprofen, Advil, Naproxen BC and Goody Powder or any medication containing Aspirin; stop now.   WHAT DO I DO ABOUT MY DIABETES MEDICATION?   Marland Kitchen Do not take oral diabetes medicines (pills) the morning of surgery such as Metformin ( Glucophage- XR )    How to Manage Your Diabetes Before and After Surgery  Why is it important to control my blood sugar before and after surgery? . Improving blood sugar levels before and after surgery helps healing and can limit problems. . A way of improving blood sugar control is eating a healthy diet by: o  Eating less sugar and carbohydrates o  Increasing activity/exercise o  Talking with  your doctor about reaching your blood sugar goals . High blood sugars (greater than 180 mg/dL) can raise your risk of infections and slow your recovery, so you will need to focus on controlling your diabetes during the weeks before surgery. . Make sure that the doctor who takes care of your diabetes knows about your planned surgery including the date and location.  How do I manage my blood sugar before surgery? . Check your blood sugar at least 4 times a day, starting 2 days before surgery, to make sure that the level is not too high or low. o Check your blood sugar the morning of your surgery when you wake up and every 2 hours until you get to the Short Stay unit. . If your blood sugar is less than 70 mg/dL, you will need to treat for low blood sugar: o Do not take insulin. o Treat a low blood sugar (less than 70 mg/dL) with  cup of clear juice (cranberry or apple), 4 glucose tablets, OR glucose gel. o Recheck blood sugar in 15 minutes after treatment (to make sure it is greater than 70 mg/dL). If your blood sugar is not greater than 70 mg/dL on recheck, call [phone] for further instructions. . Report your blood sugar to the short stay nurse when you get to Short Stay.  . If you are admitted to the hospital after surgery: o Your blood sugar will be checked by the staff and you will probably be given insulin after surgery (instead of oral diabetes medicines) to  make sure you have good blood sugar levels. o The goal for blood sugar control after surgery is 80-180 mg/dL.    Do not wear jewelry, make-up or nail polish.  Do not wear lotions, powders, or perfumes.  You may not wear deoderant.  Do not shave 48 hours prior to surgery.   Do not bring valuables to the hospital.  [facility] is not responsible for any belongings or valuables.  Contacts, dentures or bridgework may not be worn into surgery.  Leave your suitcase in the car.  After surgery it may be brought to your room.  For  patients admitted to the hospital, discharge time will be determined by your treatment team.  Patients discharged the day of surgery will not be allowed to drive home.   Name and phone number of your driver:   Special instructions:  [facility]- Preparing For Surgery  Before surgery, you can play an important role. Because skin is not sterile, your skin needs to be as free of germs as possible. You can reduce the number of germs on your skin by washing with CHG (chlorahexidine gluconate) Soap before surgery.  CHG is an antiseptic cleaner which kills germs and bonds with the skin to continue killing germs even after washing.  Please do not use if you have an allergy to CHG or antibacterial soaps. If your skin becomes reddened/irritated stop using the CHG.  Do not shave (including legs and underarms) for at least 48 hours prior to first CHG shower. It is OK to shave your face.  Please follow these instructions carefully.   1. Shower the NIGHT BEFORE SURGERY and the MORNING OF SURGERY with CHG.   2. If you chose to wash your hair, wash your hair first as usual with your normal shampoo.  3. After you shampoo, rinse your hair and body thoroughly to remove the shampoo.  4. Use CHG as you would any other liquid soap. You can apply CHG directly to the skin and wash gently with a scrungie or a clean washcloth.   5. Apply the CHG Soap to your body ONLY FROM THE NECK DOWN.  Do not use on open wounds or open sores. Avoid contact with your eyes, ears, mouth and genitals (private parts). Wash genitals (private parts) with your normal soap.  6. Wash thoroughly, paying special attention to the area where your surgery will be performed.  7. Thoroughly rinse your body with warm water from the neck down.  8. DO NOT shower/wash with your normal soap after using and rinsing off the CHG Soap.  9. Pat yourself dry with a CLEAN TOWEL.   10. Wear CLEAN PAJAMAS   11. Place CLEAN SHEETS on your bed the night  of your first shower and DO NOT SLEEP WITH PETS.    Day of Surgery: Do not apply any deodorants/lotions. Please wear clean clothes to the hospital/surgery center.      Please read over the following fact sheets that you were given. Pain Booklet, Coughing and Deep Breathing, Blood Transfusion Information, Total Joint Packet, MRSA Information and Surgical Site Infection Prevention

## 2015-08-30 ENCOUNTER — Encounter (HOSPITAL_COMMUNITY): Payer: Self-pay

## 2015-08-30 ENCOUNTER — Ambulatory Visit (HOSPITAL_COMMUNITY)
Admission: RE | Admit: 2015-08-30 | Discharge: 2015-08-30 | Disposition: A | Discharge status: Home / Self Care | Payer: Medicaid Other | Source: Ambulatory Visit | Attending: Surgery | Admitting: Surgery

## 2015-08-30 ENCOUNTER — Encounter (HOSPITAL_COMMUNITY)
Admission: RE | Admit: 2015-08-30 | Discharge: 2015-08-30 | Disposition: A | Discharge status: Home / Self Care | Payer: Medicaid Other | Source: Ambulatory Visit | Attending: Specialist | Admitting: Specialist

## 2015-08-30 DIAGNOSIS — I517 Cardiomegaly: Secondary | ICD-10-CM | POA: Insufficient documentation

## 2015-08-30 DIAGNOSIS — J449 Chronic obstructive pulmonary disease, unspecified: Secondary | ICD-10-CM | POA: Diagnosis not present

## 2015-08-30 DIAGNOSIS — Z0183 Encounter for blood typing: Secondary | ICD-10-CM | POA: Insufficient documentation

## 2015-08-30 DIAGNOSIS — M1711 Unilateral primary osteoarthritis, right knee: Secondary | ICD-10-CM | POA: Insufficient documentation

## 2015-08-30 DIAGNOSIS — Z01818 Encounter for other preprocedural examination: Secondary | ICD-10-CM

## 2015-08-30 DIAGNOSIS — Z01812 Encounter for preprocedural laboratory examination: Secondary | ICD-10-CM | POA: Diagnosis not present

## 2015-08-30 LAB — CBC
HCT: 39.3 % (ref 36.0–46.0)
Hemoglobin: 13.2 g/dL (ref 12.0–15.0)
MCH: 27.6 pg (ref 26.0–34.0)
MCHC: 33.6 g/dL (ref 30.0–36.0)
MCV: 82.2 fL (ref 78.0–100.0)
Platelets: 274 10*3/uL (ref 150–400)
RBC: 4.78 MIL/uL (ref 3.87–5.11)
RDW: 14.1 % (ref 11.5–15.5)
WBC: 8.2 10*3/uL (ref 4.0–10.5)

## 2015-08-30 LAB — COMPREHENSIVE METABOLIC PANEL
ALT: 16 U/L (ref 14–54)
AST: 21 U/L (ref 15–41)
Albumin: 4.4 g/dL (ref 3.5–5.0)
Alkaline Phosphatase: 97 U/L (ref 38–126)
Anion gap: 8 (ref 5–15)
BUN: 11 mg/dL (ref 6–20)
CO2: 29 mmol/L (ref 22–32)
Calcium: 9.8 mg/dL (ref 8.9–10.3)
Chloride: 101 mmol/L (ref 101–111)
Creatinine, Ser: 0.88 mg/dL (ref 0.44–1.00)
GFR calc Af Amer: >60 mL/min (ref >60)
GFR calc non Af Amer: >60 mL/min (ref >60)
Glucose, Bld: 160 mg/dL — ABNORMAL HIGH (ref 65–99)
Potassium: 3.5 mmol/L (ref 3.5–5.1)
Sodium: 138 mmol/L (ref 135–145)
Total Bilirubin: 0.6 mg/dL (ref 0.3–1.2)
Total Protein: 7.6 g/dL (ref 6.5–8.1)

## 2015-08-30 LAB — URINALYSIS, ROUTINE W REFLEX MICROSCOPIC
Bilirubin Urine: NEGATIVE
Glucose, UA: NEGATIVE mg/dL
Hgb urine dipstick: NEGATIVE
Ketones, ur: NEGATIVE mg/dL
Leukocytes, UA: NEGATIVE
Nitrite: NEGATIVE
Protein, ur: NEGATIVE mg/dL
Specific Gravity, Urine: 1.023 (ref 1.005–1.030)
pH: 5.5 (ref 5.0–8.0)

## 2015-08-30 LAB — PROTIME-INR
INR: 0.98
Prothrombin Time: 13 seconds (ref 11.4–15.2)

## 2015-08-30 LAB — GLUCOSE, CAPILLARY: Glucose-Capillary: 117 mg/dL — ABNORMAL HIGH (ref 65–99)

## 2015-08-30 LAB — SURGICAL PCR SCREEN
MRSA, PCR: NEGATIVE
Staphylococcus aureus: NEGATIVE

## 2015-08-30 LAB — TYPE AND SCREEN
ABO/RH(D): O POS
Antibody Screen: NEGATIVE

## 2015-08-30 LAB — APTT: aPTT: 34 seconds (ref 24–36)

## 2015-08-30 NOTE — Progress Notes (Signed)
PCP - Jeanann Lewandowsky Cardiologist - denies  Chest x-ray - not needed EKG - 06/03/15 Stress Test - denies ECHO - denies Cardiac Cath - denies    Patient denies shortness of breath, fever, cough and chest pain at PAT appointment..   Patient's fasting blood glucose runs 110-120s

## 2015-08-31 LAB — HEMOGLOBIN A1C
Hgb A1c MFr Bld: 6.5 % — ABNORMAL HIGH (ref 4.8–5.6)
Mean Plasma Glucose: 140 mg/dL

## 2015-09-06 ENCOUNTER — Encounter (HOSPITAL_COMMUNITY): Payer: Self-pay | Admitting: Surgery

## 2015-09-06 ENCOUNTER — Inpatient Hospital Stay (HOSPITAL_COMMUNITY): Payer: Medicaid Other | Admitting: Anesthesiology

## 2015-09-06 ENCOUNTER — Inpatient Hospital Stay (HOSPITAL_COMMUNITY)
Admission: RE | Admit: 2015-09-06 | Discharge: 2015-09-09 | DRG: 470 | Disposition: A | Discharge status: Home Health | Payer: Medicaid Other | Source: Ambulatory Visit | Attending: Specialist | Admitting: Specialist

## 2015-09-06 ENCOUNTER — Encounter (HOSPITAL_COMMUNITY)
Admission: RE | Disposition: A | Discharge status: Home Health | Payer: Self-pay | Source: Ambulatory Visit | Attending: Specialist

## 2015-09-06 DIAGNOSIS — Z7984 Long term (current) use of oral hypoglycemic drugs: Secondary | ICD-10-CM | POA: Diagnosis not present

## 2015-09-06 DIAGNOSIS — M1711 Unilateral primary osteoarthritis, right knee: Secondary | ICD-10-CM | POA: Diagnosis present

## 2015-09-06 DIAGNOSIS — E785 Hyperlipidemia, unspecified: Secondary | ICD-10-CM | POA: Diagnosis present

## 2015-09-06 DIAGNOSIS — K219 Gastro-esophageal reflux disease without esophagitis: Secondary | ICD-10-CM | POA: Diagnosis present

## 2015-09-06 DIAGNOSIS — M109 Gout, unspecified: Secondary | ICD-10-CM | POA: Diagnosis present

## 2015-09-06 DIAGNOSIS — E119 Type 2 diabetes mellitus without complications: Secondary | ICD-10-CM

## 2015-09-06 DIAGNOSIS — I10 Essential (primary) hypertension: Secondary | ICD-10-CM | POA: Diagnosis present

## 2015-09-06 DIAGNOSIS — J449 Chronic obstructive pulmonary disease, unspecified: Secondary | ICD-10-CM | POA: Diagnosis present

## 2015-09-06 DIAGNOSIS — E1140 Type 2 diabetes mellitus with diabetic neuropathy, unspecified: Secondary | ICD-10-CM | POA: Diagnosis present

## 2015-09-06 DIAGNOSIS — M25561 Pain in right knee: Secondary | ICD-10-CM | POA: Diagnosis present

## 2015-09-06 DIAGNOSIS — M179 Osteoarthritis of knee, unspecified: Secondary | ICD-10-CM | POA: Diagnosis present

## 2015-09-06 DIAGNOSIS — M1710 Unilateral primary osteoarthritis, unspecified knee: Secondary | ICD-10-CM | POA: Diagnosis present

## 2015-09-06 DIAGNOSIS — F17210 Nicotine dependence, cigarettes, uncomplicated: Secondary | ICD-10-CM | POA: Diagnosis present

## 2015-09-06 HISTORY — PX: TOTAL KNEE ARTHROPLASTY: SHX125

## 2015-09-06 LAB — GLUCOSE, CAPILLARY
Glucose-Capillary: 140 mg/dL — ABNORMAL HIGH (ref 65–99)
Glucose-Capillary: 128 mg/dL — ABNORMAL HIGH (ref 65–99)
Glucose-Capillary: 178 mg/dL — ABNORMAL HIGH (ref 65–99)

## 2015-09-06 SURGERY — ARTHROPLASTY, KNEE, TOTAL
Anesthesia: Regional | Laterality: Right

## 2015-09-06 MED ORDER — FENTANYL CITRATE (PF) 100 MCG/2ML IJ SOLN
INTRAMUSCULAR | Status: AC
  Filled 2015-09-06: qty 2

## 2015-09-06 MED ORDER — ONDANSETRON HCL 4 MG PO TABS: 4.0000 mg | ORAL_TABLET | Freq: Four times a day (QID) | ORAL | Status: DC | PRN

## 2015-09-06 MED ORDER — HYDROCHLOROTHIAZIDE 25 MG PO TABS
25.0000 mg | ORAL_TABLET | Freq: Every day | ORAL | Status: DC
  Administered 2015-09-07 – 2015-09-09 (×3): 25 mg via ORAL
  Filled 2015-09-06 (×3): qty 1

## 2015-09-06 MED ORDER — 0.9 % SODIUM CHLORIDE (POUR BTL) OPTIME
TOPICAL | Status: DC | PRN
  Administered 2015-09-06: 1000 mL

## 2015-09-06 MED ORDER — ALUM & MAG HYDROXIDE-SIMETH 200-200-20 MG/5ML PO SUSP: 30.0000 mL | ORAL | Status: DC | PRN

## 2015-09-06 MED ORDER — MORPHINE SULFATE (PF) 2 MG/ML IV SOLN
1.0000 mg | INTRAVENOUS | Status: DC | PRN
  Administered 2015-09-07 (×2): 1 mg via INTRAVENOUS
  Filled 2015-09-06 (×2): qty 1

## 2015-09-06 MED ORDER — MOMETASONE FURO-FORMOTEROL FUM 100-5 MCG/ACT IN AERO
2.0000 | INHALATION_SPRAY | Freq: Two times a day (BID) | RESPIRATORY_TRACT | Status: DC
  Administered 2015-09-07 – 2015-09-09 (×5): 2 via RESPIRATORY_TRACT
  Filled 2015-09-06: qty 8.8

## 2015-09-06 MED ORDER — BUPIVACAINE-EPINEPHRINE (PF) 0.5% -1:200000 IJ SOLN
INTRAMUSCULAR | Status: AC
  Filled 2015-09-06: qty 30

## 2015-09-06 MED ORDER — BUPIVACAINE HCL (PF) 0.5 % IJ SOLN
INTRAMUSCULAR | Status: AC
  Filled 2015-09-06: qty 30

## 2015-09-06 MED ORDER — CYCLOBENZAPRINE HCL 10 MG PO TABS
5.0000 mg | ORAL_TABLET | Freq: Three times a day (TID) | ORAL | Status: DC | PRN
  Administered 2015-09-09: 5 mg via ORAL
  Filled 2015-09-06: qty 1

## 2015-09-06 MED ORDER — CHLORHEXIDINE GLUCONATE 4 % EX LIQD: 60.0000 mL | Freq: Once | CUTANEOUS | Status: DC

## 2015-09-06 MED ORDER — HYDROXYZINE HCL 10 MG PO TABS
10.0000 mg | ORAL_TABLET | Freq: Three times a day (TID) | ORAL | Status: DC | PRN
  Filled 2015-09-06: qty 1

## 2015-09-06 MED ORDER — METOCLOPRAMIDE HCL 5 MG PO TABS: 5.0000 mg | ORAL_TABLET | Freq: Four times a day (QID) | ORAL | Status: DC | PRN

## 2015-09-06 MED ORDER — OXYCODONE HCL 5 MG PO TABS
5.0000 mg | ORAL_TABLET | ORAL | Status: DC | PRN
  Administered 2015-09-06 – 2015-09-09 (×9): 10 mg via ORAL
  Filled 2015-09-06 (×8): qty 2

## 2015-09-06 MED ORDER — ACETAMINOPHEN 325 MG PO TABS
650.0000 mg | ORAL_TABLET | Freq: Four times a day (QID) | ORAL | Status: DC | PRN
  Administered 2015-09-07 – 2015-09-08 (×3): 650 mg via ORAL
  Filled 2015-09-06 (×3): qty 2

## 2015-09-06 MED ORDER — HYDROCORTISONE ACE-PRAMOXINE 2.5-1 % RE CREA: 1.0000 "application " | TOPICAL_CREAM | Freq: Two times a day (BID) | RECTAL | Status: DC | PRN

## 2015-09-06 MED ORDER — BUPIVACAINE HCL (PF) 0.5 % IJ SOLN
INTRAMUSCULAR | Status: DC | PRN
  Administered 2015-09-06: 10 mL

## 2015-09-06 MED ORDER — ALBUTEROL SULFATE (2.5 MG/3ML) 0.083% IN NEBU: 2.5000 mg | INHALATION_SOLUTION | RESPIRATORY_TRACT | Status: DC | PRN

## 2015-09-06 MED ORDER — BUPIVACAINE-EPINEPHRINE (PF) 0.5% -1:200000 IJ SOLN
INTRAMUSCULAR | Status: DC | PRN
  Administered 2015-09-06: 30 mL via PERINEURAL

## 2015-09-06 MED ORDER — MENTHOL 3 MG MT LOZG: 1.0000 | LOZENGE | OROMUCOSAL | Status: DC | PRN

## 2015-09-06 MED ORDER — COLCHICINE 0.6 MG PO TABS
0.6000 mg | ORAL_TABLET | Freq: Every day | ORAL | Status: DC
  Administered 2015-09-07 – 2015-09-09 (×3): 0.6 mg via ORAL
  Filled 2015-09-06 (×3): qty 1

## 2015-09-06 MED ORDER — DOCUSATE SODIUM 100 MG PO CAPS
100.0000 mg | ORAL_CAPSULE | Freq: Two times a day (BID) | ORAL | Status: DC
  Administered 2015-09-06 – 2015-09-09 (×6): 100 mg via ORAL
  Filled 2015-09-06 (×6): qty 1

## 2015-09-06 MED ORDER — PROPOFOL 10 MG/ML IV BOLUS
INTRAVENOUS | Status: DC | PRN
  Administered 2015-09-06: 50 mg via INTRAVENOUS
  Administered 2015-09-06: 150 mg via INTRAVENOUS

## 2015-09-06 MED ORDER — INSULIN ASPART 100 UNIT/ML ~~LOC~~ SOLN
0.0000 [IU] | Freq: Three times a day (TID) | SUBCUTANEOUS | Status: DC
  Administered 2015-09-07: 3 [IU] via SUBCUTANEOUS
  Administered 2015-09-07: 4 [IU] via SUBCUTANEOUS
  Administered 2015-09-08 – 2015-09-09 (×4): 3 [IU] via SUBCUTANEOUS

## 2015-09-06 MED ORDER — SIMVASTATIN 20 MG PO TABS
20.0000 mg | ORAL_TABLET | Freq: Every day | ORAL | Status: DC
  Administered 2015-09-06 – 2015-09-08 (×3): 20 mg via ORAL
  Filled 2015-09-06 (×3): qty 1

## 2015-09-06 MED ORDER — HYDROCORTISONE ACE-PRAMOXINE 2.5-1 % RE CREA: 1.0000 "application " | TOPICAL_CREAM | Freq: Two times a day (BID) | RECTAL | Status: DC

## 2015-09-06 MED ORDER — PHENYLEPHRINE 40 MCG/ML (10ML) SYRINGE FOR IV PUSH (FOR BLOOD PRESSURE SUPPORT)
PREFILLED_SYRINGE | INTRAVENOUS | Status: AC
  Filled 2015-09-06: qty 20

## 2015-09-06 MED ORDER — ONDANSETRON HCL 4 MG/2ML IJ SOLN
INTRAMUSCULAR | Status: AC
  Filled 2015-09-06: qty 2

## 2015-09-06 MED ORDER — PHENYLEPHRINE HCL 10 MG/ML IJ SOLN
INTRAMUSCULAR | Status: DC | PRN
  Administered 2015-09-06 (×2): 40 ug via INTRAVENOUS

## 2015-09-06 MED ORDER — ONDANSETRON HCL 4 MG/2ML IJ SOLN
INTRAMUSCULAR | Status: DC | PRN
  Administered 2015-09-06: 4 mg via INTRAVENOUS

## 2015-09-06 MED ORDER — METHOCARBAMOL 500 MG PO TABS
ORAL_TABLET | ORAL | Status: AC
  Filled 2015-09-06: qty 1

## 2015-09-06 MED ORDER — CEFAZOLIN SODIUM-DEXTROSE 2-4 GM/100ML-% IV SOLN
2.0000 g | INTRAVENOUS | Status: AC
  Administered 2015-09-06: 2 g via INTRAVENOUS
  Filled 2015-09-06: qty 100

## 2015-09-06 MED ORDER — ALLOPURINOL 100 MG PO TABS
100.0000 mg | ORAL_TABLET | Freq: Every day | ORAL | Status: DC
  Administered 2015-09-07 – 2015-09-09 (×3): 100 mg via ORAL
  Filled 2015-09-06 (×3): qty 1

## 2015-09-06 MED ORDER — OXYCODONE HCL 5 MG PO TABS
ORAL_TABLET | ORAL | Status: AC
  Filled 2015-09-06: qty 2

## 2015-09-06 MED ORDER — FLUTICASONE FUROATE-VILANTEROL 100-25 MCG/INH IN AEPB: 1.0000 | INHALATION_SPRAY | Freq: Every day | RESPIRATORY_TRACT | Status: DC

## 2015-09-06 MED ORDER — LACTATED RINGERS IV SOLN
INTRAVENOUS | Status: DC | PRN
  Administered 2015-09-06 (×2): via INTRAVENOUS

## 2015-09-06 MED ORDER — MIDAZOLAM HCL 2 MG/2ML IJ SOLN
INTRAMUSCULAR | Status: AC
  Administered 2015-09-06: 1 mg
  Filled 2015-09-06: qty 2

## 2015-09-06 MED ORDER — PHENOL 1.4 % MT LIQD
1.0000 | OROMUCOSAL | Status: DC | PRN
Start: 2015-09-06 — End: 2015-09-09

## 2015-09-06 MED ORDER — FENTANYL CITRATE (PF) 250 MCG/5ML IJ SOLN
INTRAMUSCULAR | Status: AC
  Filled 2015-09-06: qty 5

## 2015-09-06 MED ORDER — LIDOCAINE HCL (CARDIAC) 20 MG/ML IV SOLN
INTRAVENOUS | Status: DC | PRN
  Administered 2015-09-06: 100 mg via INTRAVENOUS

## 2015-09-06 MED ORDER — METHOCARBAMOL 500 MG PO TABS
500.0000 mg | ORAL_TABLET | Freq: Four times a day (QID) | ORAL | Status: DC | PRN
  Administered 2015-09-06 – 2015-09-08 (×7): 500 mg via ORAL
  Filled 2015-09-06 (×6): qty 1

## 2015-09-06 MED ORDER — LACTATED RINGERS IV SOLN
INTRAVENOUS | Status: DC
  Administered 2015-09-06: 11:00:00 via INTRAVENOUS

## 2015-09-06 MED ORDER — LIDOCAINE 2% (20 MG/ML) 5 ML SYRINGE
INTRAMUSCULAR | Status: AC
  Filled 2015-09-06: qty 5

## 2015-09-06 MED ORDER — POTASSIUM CHLORIDE CRYS ER 20 MEQ PO TBCR
20.0000 meq | EXTENDED_RELEASE_TABLET | Freq: Every day | ORAL | Status: DC
  Administered 2015-09-06 – 2015-09-09 (×4): 20 meq via ORAL
  Filled 2015-09-06 (×4): qty 1

## 2015-09-06 MED ORDER — FLEET ENEMA 7-19 GM/118ML RE ENEM: 1.0000 | ENEMA | Freq: Once | RECTAL | Status: DC | PRN

## 2015-09-06 MED ORDER — FENTANYL CITRATE (PF) 100 MCG/2ML IJ SOLN
25.0000 ug | INTRAMUSCULAR | Status: DC | PRN
  Administered 2015-09-06 (×3): 50 ug via INTRAVENOUS

## 2015-09-06 MED ORDER — POLYETHYLENE GLYCOL 3350 17 G PO PACK: 17.0000 g | PACK | Freq: Every day | ORAL | Status: DC | PRN

## 2015-09-06 MED ORDER — GABAPENTIN 300 MG PO CAPS
300.0000 mg | ORAL_CAPSULE | Freq: Three times a day (TID) | ORAL | Status: DC
  Administered 2015-09-06 – 2015-09-09 (×8): 300 mg via ORAL
  Filled 2015-09-06 (×8): qty 1

## 2015-09-06 MED ORDER — BUPIVACAINE LIPOSOME 1.3 % IJ SUSP
INTRAMUSCULAR | Status: DC | PRN
  Administered 2015-09-06: 10 mL

## 2015-09-06 MED ORDER — FENTANYL CITRATE (PF) 100 MCG/2ML IJ SOLN
INTRAMUSCULAR | Status: DC | PRN
  Administered 2015-09-06: 25 ug via INTRAVENOUS
  Administered 2015-09-06: 50 ug via INTRAVENOUS
  Administered 2015-09-06 (×3): 25 ug via INTRAVENOUS
  Administered 2015-09-06: 50 ug via INTRAVENOUS
  Administered 2015-09-06 (×4): 25 ug via INTRAVENOUS

## 2015-09-06 MED ORDER — BUPIVACAINE-EPINEPHRINE 0.5% -1:200000 IJ SOLN
INTRAMUSCULAR | Status: DC | PRN
  Administered 2015-09-06: 5 mL

## 2015-09-06 MED ORDER — ACETAMINOPHEN 650 MG RE SUPP: 650.0000 mg | Freq: Four times a day (QID) | RECTAL | Status: DC | PRN

## 2015-09-06 MED ORDER — BISACODYL 5 MG PO TBEC: 5.0000 mg | DELAYED_RELEASE_TABLET | Freq: Every day | ORAL | Status: DC | PRN

## 2015-09-06 MED ORDER — PANTOPRAZOLE SODIUM 40 MG PO TBEC
40.0000 mg | DELAYED_RELEASE_TABLET | Freq: Every day | ORAL | Status: DC
  Administered 2015-09-07 – 2015-09-09 (×3): 40 mg via ORAL
  Filled 2015-09-06 (×3): qty 1

## 2015-09-06 MED ORDER — METOCLOPRAMIDE HCL 5 MG/ML IJ SOLN
5.0000 mg | Freq: Three times a day (TID) | INTRAMUSCULAR | Status: DC | PRN
Start: 2015-09-06 — End: 2015-09-09

## 2015-09-06 MED ORDER — PROMETHAZINE HCL 25 MG/ML IJ SOLN: 12.5000 mg | Freq: Once | INTRAMUSCULAR | Status: DC | PRN

## 2015-09-06 MED ORDER — PHENYLEPHRINE HCL 10 MG/ML IJ SOLN
INTRAVENOUS | Status: DC | PRN
  Administered 2015-09-06: 25 ug/min via INTRAVENOUS

## 2015-09-06 MED ORDER — OXYCODONE HCL ER 10 MG PO T12A
10.0000 mg | EXTENDED_RELEASE_TABLET | Freq: Two times a day (BID) | ORAL | Status: DC
  Administered 2015-09-06 – 2015-09-09 (×6): 10 mg via ORAL
  Filled 2015-09-06 (×6): qty 1

## 2015-09-06 MED ORDER — SODIUM CHLORIDE 0.9 % IR SOLN
Status: DC | PRN
  Administered 2015-09-06: 3000 mL

## 2015-09-06 MED ORDER — METOCLOPRAMIDE HCL 5 MG PO TABS: 5.0000 mg | ORAL_TABLET | Freq: Three times a day (TID) | ORAL | Status: DC | PRN

## 2015-09-06 MED ORDER — ONDANSETRON HCL 4 MG/2ML IJ SOLN: 4.0000 mg | Freq: Four times a day (QID) | INTRAMUSCULAR | Status: DC | PRN

## 2015-09-06 MED ORDER — RIVAROXABAN 10 MG PO TABS
10.0000 mg | ORAL_TABLET | Freq: Every day | ORAL | Status: DC
  Administered 2015-09-07 – 2015-09-09 (×3): 10 mg via ORAL
  Filled 2015-09-06 (×3): qty 1

## 2015-09-06 MED ORDER — SODIUM CHLORIDE 0.9 % IV SOLN
INTRAVENOUS | Status: DC
  Administered 2015-09-06: 20:00:00 via INTRAVENOUS
  Administered 2015-09-07: 75 mL/h via INTRAVENOUS

## 2015-09-06 SURGICAL SUPPLY — 70 items
BANDAGE ACE 6X5 VEL STRL LF (GAUZE/BANDAGES/DRESSINGS) ×2 IMPLANT
BANDAGE ELASTIC 4 VELCRO ST LF (GAUZE/BANDAGES/DRESSINGS) ×2 IMPLANT
BANDAGE ESMARK 6X9 LF (GAUZE/BANDAGES/DRESSINGS) ×1 IMPLANT
BENZOIN TINCTURE PRP APPL 2/3 (GAUZE/BANDAGES/DRESSINGS) ×2 IMPLANT
BLADE SAG 18X100X1.27 (BLADE) ×2 IMPLANT
BLADE SAW SGTL 13X75X1.27 (BLADE) ×2 IMPLANT
BNDG ELASTIC 6X10 VLCR STRL LF (GAUZE/BANDAGES/DRESSINGS) ×2 IMPLANT
BNDG ESMARK 6X9 LF (GAUZE/BANDAGES/DRESSINGS) ×2
BOWL SMART MIX CTS (DISPOSABLE) ×2 IMPLANT
CAP KNEE TOTAL 3 SIGMA ×2 IMPLANT
CEMENT HV SMART SET (Cement) ×4 IMPLANT
COVER SURGICAL LIGHT HANDLE (MISCELLANEOUS) ×2 IMPLANT
CUFF TOURNIQUET SINGLE 34IN LL (TOURNIQUET CUFF) ×2 IMPLANT
CUFF TOURNIQUET SINGLE 44IN (TOURNIQUET CUFF) IMPLANT
DERMABOND ADVANCED (GAUZE/BANDAGES/DRESSINGS) ×1
DERMABOND ADVANCED .7 DNX12 (GAUZE/BANDAGES/DRESSINGS) ×1 IMPLANT
DRAPE IMP U-DRAPE 54X76 (DRAPES) ×2 IMPLANT
DRAPE ORTHO SPLIT 77X108 STRL (DRAPES) ×2
DRAPE SURG ORHT 6 SPLT 77X108 (DRAPES) ×2 IMPLANT
DRAPE U-SHAPE 47X51 STRL (DRAPES) ×2 IMPLANT
DRSG ADAPTIC 3X8 NADH LF (GAUZE/BANDAGES/DRESSINGS) ×2 IMPLANT
DRSG PAD ABDOMINAL 8X10 ST (GAUZE/BANDAGES/DRESSINGS) ×2 IMPLANT
DURAPREP 26ML APPLICATOR (WOUND CARE) ×4 IMPLANT
ELECT REM PT RETURN 9FT ADLT (ELECTROSURGICAL) ×2
ELECTRODE REM PT RTRN 9FT ADLT (ELECTROSURGICAL) ×1 IMPLANT
EVACUATOR 1/8 PVC DRAIN (DRAIN) IMPLANT
FACESHIELD WRAPAROUND (MASK) ×4 IMPLANT
GAUZE SPONGE 4X4 12PLY STRL (GAUZE/BANDAGES/DRESSINGS) ×2 IMPLANT
GLOVE BIOGEL PI IND STRL 8 (GLOVE) ×1 IMPLANT
GLOVE BIOGEL PI INDICATOR 8 (GLOVE) ×1
GLOVE ECLIPSE 9.0 STRL (GLOVE) ×2 IMPLANT
GLOVE ORTHO TXT STRL SZ7.5 (GLOVE) ×2 IMPLANT
GLOVE SURG 8.5 LATEX PF (GLOVE) ×2 IMPLANT
GOWN STRL REUS W/ TWL LRG LVL3 (GOWN DISPOSABLE) ×1 IMPLANT
GOWN STRL REUS W/TWL 2XL LVL3 (GOWN DISPOSABLE) ×4 IMPLANT
GOWN STRL REUS W/TWL LRG LVL3 (GOWN DISPOSABLE) ×1
HANDPIECE INTERPULSE COAX TIP (DISPOSABLE) ×1
IMMOBILIZER KNEE 22 (SOFTGOODS) ×2 IMPLANT
IMMOBILIZER KNEE 22 UNIV (SOFTGOODS) IMPLANT
KIT BASIN OR (CUSTOM PROCEDURE TRAY) ×2 IMPLANT
KIT ROOM TURNOVER OR (KITS) ×2 IMPLANT
MANIFOLD NEPTUNE II (INSTRUMENTS) ×2 IMPLANT
NEEDLE HYPO 25X1 1.5 SAFETY (NEEDLE) IMPLANT
NS IRRIG 1000ML POUR BTL (IV SOLUTION) ×2 IMPLANT
PACK TOTAL JOINT (CUSTOM PROCEDURE TRAY) ×2 IMPLANT
PACK UNIVERSAL I (CUSTOM PROCEDURE TRAY) ×2 IMPLANT
PAD ARMBOARD 7.5X6 YLW CONV (MISCELLANEOUS) ×4 IMPLANT
PAD CAST 4YDX4 CTTN HI CHSV (CAST SUPPLIES) ×1 IMPLANT
PADDING CAST COTTON 4X4 STRL (CAST SUPPLIES) ×1
PADDING CAST COTTON 6X4 STRL (CAST SUPPLIES) ×2 IMPLANT
PIN STEINMAN FIXATION KNEE (PIN) ×2 IMPLANT
SET HNDPC FAN SPRY TIP SCT (DISPOSABLE) ×1 IMPLANT
SPONGE GAUZE 4X4 12PLY STER LF (GAUZE/BANDAGES/DRESSINGS) ×2 IMPLANT
STAPLER VISISTAT 35W (STAPLE) IMPLANT
STRIP CLOSURE SKIN 1/2X4 (GAUZE/BANDAGES/DRESSINGS) ×4 IMPLANT
SUCTION FRAZIER HANDLE 10FR (MISCELLANEOUS)
SUCTION TUBE FRAZIER 10FR DISP (MISCELLANEOUS) IMPLANT
SUT BONE WAX W31G (SUTURE) IMPLANT
SUT VIC AB 0 CT1 27 (SUTURE) ×3
SUT VIC AB 0 CT1 27XBRD ANBCTR (SUTURE) ×3 IMPLANT
SUT VIC AB 1 CT1 27 (SUTURE) ×2
SUT VIC AB 1 CT1 27XBRD ANBCTR (SUTURE) ×2 IMPLANT
SUT VIC AB 2-0 CT1 27 (SUTURE) ×3
SUT VIC AB 2-0 CT1 TAPERPNT 27 (SUTURE) ×3 IMPLANT
SUT VICRYL 4-0 PS2 18IN ABS (SUTURE) ×2 IMPLANT
SYR CONTROL 10ML LL (SYRINGE) IMPLANT
TOWEL OR 17X24 6PK STRL BLUE (TOWEL DISPOSABLE) ×2 IMPLANT
TOWEL OR 17X26 10 PK STRL BLUE (TOWEL DISPOSABLE) ×2 IMPLANT
TRAY FOLEY CATH 16FR SILVER (SET/KITS/TRAYS/PACK) ×2 IMPLANT
WATER STERILE IRR 1000ML POUR (IV SOLUTION) ×2 IMPLANT

## 2015-09-06 NOTE — Brief Op Note (Signed)
PATIENT ID:      Stacy [name]  MRN:     [id number] DOB/AGE:    1960-03-26 / 55 y.o.       OPERATIVE REPORT   DATE OF PROCEDURE:  09/06/2015      PREOPERATIVE DIAGNOSIS:   Right knee osteoarthritis                                                       Body mass index is 26.63 kg/m.    POSTOPERATIVE DIAGNOSIS:   Right knee osteoarthritis                                                                     Body mass index is 26.63 kg/m.    PROCEDURE:  Procedure(s): RIGHT TOTAL KNEE ARTHROPLASTY    SURGEON: NITKA,JAMES E   ASSISTANT: Andee Lineman          ANESTHESIA:  General and supplemental infiltation locally with marcaine 0.5% 1:1 exparel 1.3% total 30CC, Dr. Rubye Oaks.  EBL:100CC  DRAINS:Foley to SD  COMPONENTS:   Implant Name Type Inv. Item Serial No. Manufacturer Lot No. LRB No. Used  CEMENT HV SMART SET - [id number] Cement CEMENT HV SMART SET  DEPUY [id number] Right 2  TIBIA MBT CEMENT - [id number] Knees TIBIA MBT CEMENT  DEPUY [id number] Right 1  PATELLA DOME PFC - [id number] Knees PATELLA DOME PFC  DEPUY [id number] Right 1  FEMUR RIGHT SZ 3 - [id number] Knees FEMUR RIGHT SZ 3  DEPUY [id number] Right 1  INSERT TIBIAL PFC - [id number] Knees INSERT TIBIAL PFC   DEPUY [id number] Right 1    TOURNIQUET TIME: 93 min at Hg  COMPLICATIONS:  None   CONDITION:  stable    NITKA,JAMES E 09/06/2015, 4:25 PM

## 2015-09-06 NOTE — Anesthesia Procedure Notes (Signed)
Anesthesia Regional Block:  Adductor canal block  Pre-Anesthetic Checklist: ,, timeout performed, Correct Patient, Correct Site, Correct Laterality, Correct Procedure, Correct Position, risks and benefits discussed, surgical consent, pre-op evaluation,  At surgeon's request and post-op pain management  Laterality: Right  Prep: chloraprep       Needles:  Injection technique: Single-shot  Needle Type: Stimiplex     Needle Length: 9cm 9 cm Needle Gauge: 21 and 21 G    Additional Needles:  Procedures: ultrasound guided (picture in chart) Adductor canal block Narrative:  Start time: 09/06/2015 11:55 AM End time: 09/06/2015 12:00 PM Injection made incrementally with aspirations every 5 mL.  Performed by: Personally  Anesthesiologist: Linton Rump

## 2015-09-06 NOTE — H&P (Signed)
TOTAL KNEE ADMISSION H&P  Patient is being admitted for right total knee arthroplasty.  Subjective:  Chief Complaint:right knee pain.  HPI: Stacy [name], 55 y.o. female, has a history of pain and functional disability in the right knee due to arthritis and has failed non-surgical conservative treatments for greater than 12 weeks to includeNSAID's and/or analgesics, corticosteriod injections, viscosupplementation injections, use of assistive devices and activity modification.  Onset of symptoms was gradual, starting 10 years ago with gradually worsening course since that time. The patient noted no past surgery on the right knee(s).  Patient currently rates pain in the right knee(s) at 10 out of 10 with activity. Patient has night pain, worsening of pain with activity and weight bearing, pain that interferes with activities of daily living, pain with passive range of motion and joint swelling.  Patient has evidence of subchondral cysts, subchondral sclerosis, periarticular osteophytes and joint space narrowing by imaging studies.   Patient Active Problem List   Diagnosis Date Noted  . Type 2 diabetes mellitus without complication, without long-term current use of insulin (HCC) 06/13/2015  . Neurogenic claudication due to lumbar spinal stenosis 06/03/2015    Class: Chronic  . Chondromalacia of both patellae 06/03/2015    Class: Chronic  . Spinal stenosis, lumbar region, with neurogenic claudication 06/03/2015  . Tobacco use disorder 04/25/2015  . DJD (degenerative joint disease) of knee 01/04/2015  . Falls frequently 11/29/2014  . Hemorrhoid 11/14/2014  . Bilateral knee pain 07/31/2014  . Asthma, chronic 07/19/2014  . Gout of big toe 07/19/2014  . Midline low back pain with right-sided sciatica 01/01/2014  . Bilateral low back pain without sciatica 08/14/2013  . Essential hypertension 08/14/2013  . Gastroesophageal reflux disease without esophagitis 08/14/2013  . COPD exacerbation (HCC)  08/14/2013  . Colon cancer screening 08/14/2013  . Low back pain 05/15/2013  . Preventative health care 05/15/2013  . GERD (gastroesophageal reflux disease) 02/16/2013  . DM (diabetes mellitus) (HCC) 02/16/2013  . COPD (chronic obstructive pulmonary disease) (HCC) 04/17/2011  . Hypertension 04/17/2011  . Cough 04/16/2011   Past Medical History:  Diagnosis Date  . Arthritis   . Asthma   . COPD (chronic obstructive pulmonary disease) (HCC)   . Diabetes mellitus    Type II  . GERD (gastroesophageal reflux disease)    Pt on Protonix daily  . Gout   . Headache(784.0)    otc meds prn  . Hyperlipidemia   . Hypertension    Pt on lisinopril  . Irritable bowel syndrome 11/19/2010  . Neuropathy (HCC)   . Shortness of breath    occasional - uses breathing tx at home    Past Surgical History:  Procedure Laterality Date  . CHOLECYSTECTOMY    . ENDOMETRIAL ABLATION  10/2010  . HERNIA REPAIR     umbicial hernia  . LUMBAR DISC SURGERY  06/03/2015   L 2  L3 L4 L5   . LUMBAR LAMINECTOMY/DECOMPRESSION MICRODISCECTOMY N/A 06/03/2015   Procedure: Bilateral lateral recess decompression L2-3, L3-4, L4-5;  Surgeon: Kerrin Champagne, MD;  Location: [facility] OR;  Service: Orthopedics;  Laterality: N/A;  . svd      x 2  . TUBAL LIGATION    . UPPER GASTROINTESTINAL ENDOSCOPY  04/28/11    Prescriptions Prior to Admission  Medication Sig Dispense Refill Last Dose  . acetaminophen-codeine (TYLENOL #3) 300-30 MG tablet Take 1 tablet by mouth every 4 (four) hours as needed. 90 tablet 0 Past Week at Unknown time  . albuterol (PROVENTIL) (  2.5 MG/3ML) 0.083% nebulizer solution Take 3 mLs (2.5 mg total) by nebulization every 4 (four) hours as needed for wheezing. 75 mL 3 Past Month at Unknown time  . allopurinol (ZYLOPRIM) 100 MG tablet Take 1 tablet (100 mg total) by mouth daily. 90 tablet 3 09/06/2015 at 0600  . budesonide-formoterol (SYMBICORT) 80-4.5 MCG/ACT inhaler Inhale 2 puffs into the lungs 2 (two) times  daily. 3 Inhaler 3 09/05/2015 at Unknown time  . colchicine 0.6 MG tablet Take 1 tablet (0.6 mg total) by mouth daily. 90 tablet 3 09/06/2015 at 0600  . cyclobenzaprine (FLEXERIL) 5 MG tablet Take 1 tablet (5 mg total) by mouth 3 (three) times daily as needed for muscle spasms. 60 tablet 0 09/05/2015 at Unknown time  . EPINEPHrine 0.3 mg/0.3 mL IJ SOAJ injection Inject 0.3 mLs (0.3 mg total) into the muscle once. 1 Device 1 Taking  . gabapentin (NEURONTIN) 300 MG capsule Take 1 capsule (300 mg total) by mouth 3 (three) times daily. 270 capsule 3 09/06/2015 at 0600  . hydrochlorothiazide (HYDRODIURIL) 25 MG tablet Take 1 tablet (25 mg total) by mouth daily. 90 tablet 3 09/05/2015 at Unknown time  . hydrocortisone-pramoxine (ANALPRAM-HC) 2.5-1 % rectal cream Place 1 application rectally 2 (two) times daily. (Patient taking differently: Place 1 application rectally 2 (two) times daily as needed for hemorrhoids. ) 30 g 0 Taking  . hydrOXYzine (ATARAX/VISTARIL) 10 MG tablet TAKE 1 TABLET BY MOUTH THREE TIMES DAILY AS NEEDED 30 tablet 2 09/05/2015 at Unknown time  . metFORMIN (GLUCOPHAGE-XR) 750 MG 24 hr tablet Take 1 tablet (750 mg total) by mouth daily with breakfast. 90 tablet 3 09/05/2015 at Unknown time  . methocarbamol (ROBAXIN) 500 MG tablet Take 1 tablet (500 mg total) by mouth every 6 (six) hours as needed for muscle spasms. 60 tablet 0 09/05/2015 at Unknown time  . metoCLOPramide (REGLAN) 5 MG tablet Take 5 mg by mouth every 6 (six) hours as needed for nausea or vomiting.     . mometasone-formoterol (DULERA) 100-5 MCG/ACT AERO Inhale 2 puffs into the lungs 2 (two) times daily. 1 Inhaler 3 09/05/2015 at Unknown time  . pantoprazole (PROTONIX) 40 MG tablet Take 1 tablet (40 mg total) by mouth daily. 90 tablet 3 09/06/2015 at 0600  . potassium chloride SA (K-DUR,KLOR-CON) 20 MEQ tablet Take 1 tablet (20 mEq total) by mouth daily. 30 tablet 3 09/05/2015 at Unknown time  . simvastatin (ZOCOR) 20 MG tablet Take 1 tablet (20  mg total) by mouth at bedtime. 90 tablet 3 09/05/2015 at Unknown time  . Blood Glucose Monitoring Suppl (TRUE METRIX METER) DEVI 1 Device by Does not apply route 4 (four) times daily -  before meals and at bedtime. 1 Device 0 Taking  . glucose blood (ACCU-CHEK AVIVA) test strip Use as instructed 100 each 12 Taking  . glucose blood (TRUE METRIX BLOOD GLUCOSE TEST) test strip USE AS DIRECTED BY PHYSICIAN 100 each 12 Taking  . Lancets (ACCU-CHEK SOFT TOUCH) lancets Use as instructed 100 each 12 Taking   Allergies  Allergen Reactions  . Other Shortness Of Breath    UNSPECIFIED AGENTS Allergic to perfumes and cleaning products  . Ace Inhibitors Other (See Comments) and Cough       . Aspirin Nausea Only    unknown    Social History  Substance Use Topics  . Smoking status: Current Every Day Smoker    Packs/day: 1.00    Years: 32.00    Types: Cigarettes  . Smokeless  tobacco: Never Used  . Alcohol use No    Family History  Problem Relation Age of Onset  . Hypertension Father   . Cancer Father   . Asthma Son     had as a child  . Heart disease Sister   . Breast cancer Sister   . Hypertension Brother      Review of Systems  Constitutional: Negative.   HENT: Negative.   Eyes: Negative.   Respiratory: Negative.   Cardiovascular: Negative.   Gastrointestinal: Negative.   Genitourinary: Negative.   Musculoskeletal: Positive for joint pain.  Skin: Negative.   Neurological: Negative.   Psychiatric/Behavioral: Negative.     Objective:  Physical Exam  Constitutional: She is oriented to person, place, and time. No distress.  HENT:  Head: Atraumatic.  Eyes: EOM are normal.  Neck: Normal range of motion.  Cardiovascular: Normal rate.   Respiratory: No respiratory distress.  GI: She exhibits no distension.  Musculoskeletal: She exhibits tenderness.  Neurological: She is alert and oriented to person, place, and time.  Skin: Skin is warm and dry.  Psychiatric: She has a normal  mood and affect.    Vital signs in last 24 hours: Temp:  [98.6 F (37 C)] 98.6 F (37 C) (08/04 0952) Pulse Rate:  [93] 93 (08/04 0952) Resp:  [18] 18 (08/04 0952) BP: (141)/(90) 141/90 (08/04 0952) SpO2:  [99 %] 99 % (08/04 0952) Weight:  [74.8 kg (165 lb)] 74.8 kg (165 lb) (08/04 0952)  Labs:   Estimated body mass index is 26.63 kg/m as calculated from the following:   Height as of this encounter: 5\' 6"  (1.676 m).   Weight as of this encounter: 74.8 kg (165 lb).   Imaging Review Plain radiographs demonstrate moderate degenerative joint disease of the right knee(s). The overall alignment ismild varus. The bone quality appears to be good for age and reported activity level.  Assessment/Plan:  End stage arthritis, right knee   The patient history, physical examination, clinical judgment of the provider and imaging studies are consistent with end stage degenerative joint disease of the right knee(s) and total knee arthroplasty is deemed medically necessary. The treatment options including medical management, injection therapy arthroscopy and arthroplasty were discussed at length. The risks and benefits of total knee arthroplasty were presented and reviewed. The risks due to aseptic loosening, infection, stiffness, patella tracking problems, thromboembolic complications and other imponderables were discussed. The patient acknowledged the explanation, agreed to proceed with the plan and consent was signed. Patient is being admitted for inpatient treatment for surgery, pain control, PT, OT, prophylactic antibiotics, VTE prophylaxis, progressive ambulation and ADL's and discharge planning. The patient is planning to be discharged home with home health services

## 2015-09-06 NOTE — Interval H&P Note (Signed)
The patient has been re-examined, and the chart reviewed, and there have been no interval changes to the documented history and physical.    The risks, benefits, and alternatives have been discussed at length, and the patient is willing to proceed.   

## 2015-09-06 NOTE — Op Note (Addendum)
09/06/2015  4:31 PM  PATIENT:  Stacy [name]  55 y.o. female  MRN: [id number]  OPERATIVE REPORT   PRE-OPERATIVE DIAGNOSIS:  Right knee osteoarthritis  POST-OPERATIVE DIAGNOSIS:  Right knee osteoarthritis  PROCEDURE:  Procedure(s): RIGHT TOTAL KNEE ARTHROPLASTY    SURGEON: [name], MD      ASSISTANT:  Benjiman Core, PA-C  (Present throughout the entire procedure and necessary for completion of procedure in a timely manner)      ANESTHESIA:  General with supplemental infiltration locally with marcaine 0.5% 1:1 exparel 1.3% total 30 cc, Dr. Bobette Mo .     COMPLICATIONS:  None.   EBL: 100CC  TOURNIQUET TIME: 93 MIN @ 280 MM HG    COMPONENTS:   Implant Name Type Inv. Item Serial No. Manufacturer Lot No. LRB No. Used  CEMENT HV SMART SET - [id number] Cement CEMENT HV SMART SET  DEPUY [id number] Right 2  TIBIA MBT CEMENT - [id number] Knees TIBIA MBT CEMENT  DEPUY [id number] Right 1  PATELLA DOME PFC 35MM - [id number] Knees PATELLA DOME PFC 35MM  DEPUY [id number] Right 1  FEMUR RIGHT SZ 3 - [id number] Knees FEMUR RIGHT SZ 3  DEPUY [id number] Right 1  INSERT TIBIAL PFC - [id number] Knees INSERT TIBIAL PFC   DEPUY [id number] Right 1    COMPONENTS:  De[employer] Sigma cemented total knee system.  A #3 femoral component.  A #3 tibial tray with a 10.0 mm polyethylene RP tibial spacer , a 35 mm polyethylene patella.   PROCEDURE:The patient was met in the holding area, and the appropriate right knee identified and marked with "X" and my initials. The patient did not received a preoperative femoral nerve block by anesthesia.  The patient was then transported to OR and was placed on the operative table in a supine position. The patient was then placed under general anesthesia without difficulty. The patient received appropriate preoperative antibiotic prophylaxis. The patien'ts right knee was examined under anesthesia and shown to have 0-130 range of motion,varus deformity, ligamentously stable, and normal patella  tracking.Tourniquet was applied to the operative thigh. Leg was then prepped using sterile conditions and draped using sterile technique. Time-out procedure was called and correct right knee identified.  The leg was elevated and Esmarch exsanguinated with a thigh  tourniquet elevated ot 280 mmHg.  Initially, through a 15-cm longitudinal incision based over the patella initial exposure was made. The underlying subcutaneous tissues were incised along with the skin incision. A median arthrotomy was performed revealing an excessive amount of normal-appearing joint fluid, The articular surfaces were inspected. The patient had grade 2 changes medially, grade 2 changes laterally, and grade 4 changes in the right patellofemoral joint.  The medial and lateral meniscal remnants were removed as well as the anterior cruciate ligament.   Attention then turned to the femur where the knee was flexed to 90 degrees and an intramedullary guide placed a drill hole placed just anterior 7 mm to the intercondylar notch on the distal femur. The distal cutting jig then attached to the intramedulllay nail 5 degrees of valgus for the right knee. A planned 10 mm to be removed off the most prominent condyle, medial in this case. The jig pinned in place and the distal femoral cut performed.  Anterior bow of the femur measured at 0. Based on this then the amount of proximal tibial cut was determined to be in line with off the depressed medial side of the joint 57m. Using the proximal tibial cutting jig with the leg  alignment guide the jig was pinned to the proximal tibia. 3 pins were used 0 degrees of posterior slope.Tibia then subluxed and proximal tibial cut was then performed. Soft tissue tensioning then examined and found to be well balanced in extension. #3 femoral component chosen. The trial placed against the end of the femur and felt to be a good fit. Distal femur cutting jig was then carefully positioned on the distal femur using  the end guide and placing 2 pins in the anterior end of the cut distal femur.  2 threaded pins used to pin the jig in place   Rotation guide was then corrected the alignment 2 pins placed over the distal cut surface of the femur for placement of the jig for the chamfer cuts and coronal cuts. This is a carefully placed and held in place with threaded pins. Protecting soft tissues then the anterior cut was made after first verifying with the wing depth of the cut. Then the posterior coronal cuts protect soft tissue structures posteriorly. Posterior chamfer cut and anterior chamfer cut. These were done without difficulty. Cutting guide removed and box cutting jig was then applied to the distal femur carefully aligned and pinned into place. Box cut was then performed from the distal femur intercondylar box. Proximal tibia was then subluxed the posterior cruciate resected. A #3 plate and attached to the transverse cut surface of the proximal tibia using 2 pins. The upright for the reamer was then applied and reaming carried out for the keel for the tibial component. Using the osteotome was then inserted and impacted into place the temporary fixation pins removed from the proximal tibial plate. Femoral component was inserted using a trial component. Then a 10 mm insert placed and the knee brought into full extension full extension to 0 and 1.5 hyperextension was possible full flexion to 135 without difficulty or lift off. The components were accepted and the permanent #3 femoral and tibial components chosen and brought onto the field. Patella was observed to be a width anterior to posterior 25 mm and the residual 16 mm was allowed for the patella component. The cutting jig was then adjusted to allow for 16 mm remaining width. Applied and then the coronal cut the posterior aspect of the patella was performed. Lateral facet of patella showed very little resection. The trial 35 mm patella was chosen based on the size of  the cut surface of patella. The 35 mm drill plate applied and drill holes placed through the posterior aspect of the patella. Trial reduction with the 35 mm trial and the leg placed through range of motion showed excellent motion full extension and flexion 135 patella tendency to elevate medially was clamped in place over the medial retinaculum patellar showed normal appearance with flexion extension. No shuck noted then the knee was stable to varus and valgus at 0 30 and 60. Expose were then removed and the knee was then irrigated with copious amounts of. Solution. A 35 mm permanent patella prosthesis also brought to the field. Cement was mixed the knee was subluxed and carefully soft tissues protected note that the lug holes in the distal femur where drill using the trial prosthesis and with the drill guide provided. With irrigation completed and permanent tibial components were then inserted into place using cement and a semi-putty state over the proximal aspect of the tibia cement was also applied to the deep surface of the tibial component as well as over the posterior runners of the femur and  the deep surface of the patella component. These were then carefully place excess cement removed about the circumference of the tibial tray the femur was then placed applying cement to the cut it distal aspect of the femur and posterior chamfer area anterior chamfer and anterior coronal cut surface small amount within the box. Femoral component was then impacted into place excess cement removed amounts of circumference including the posterior chamfer area and the posterior femoral condyle area. The trial tibial peg then placed in the tibial component and a 10 mm rotating platform trial was inserted knee brought into full extension observed on the appeared to be in 2 of valgus with full extension 1 of hyperextension. Cement was then applied to the posterior cut surface of the patella within each of the patella peg  opening was then pin holes were placed for the cementing along the lateral aspect of the patella. Patella component was then carefully aligned and inserted over the posterior aspect of the patella and clamped in place excess cement was then resected circumferentially using scalpel in order for her to preserve cement the lateral facet area. Cement was allowed to fully harden tourniquet was released while cement was nearly completely hardened. Following this then irrigation was carried down careful inspection demonstrated some small bleeders present over the lateral posterior aspect of the knee joint cauterized using electrocautery off to the medial aspect and the areas of geniculate arteries. There is no active bleeding present then at that time. Trial 10 mm tibial insert demonstrated stability of the anterior drawer and negative and the knee stable to varus valgus stress and 30 and 60 flexion and full extension. A 10 mm rotating platform insert was then chosen this was applied to the tibia using the permanent implant removing the trial examining tibia and determined there was no evident residual cement remaining. Also examined posterior runners I posterior aspect of the femoral condyles for any residual cement none was present. The tibial insert in place the reduced placed through range of motion and full extension flexion and 30 knee stable to varus stress at 0 30 and 60 anterior drawer negative. This component was accepted.Tourniquet released at 93 minutes.  A drain was not necessary the synovium reapproximated over the medial aspect of the knee with 0 Vicryl sutures.  The retinaculum of the right knee and the quadriceps tendon were then reapproximated with interrupted #1 Vicryl sutures. Peritenon of the reapproximated with interrupted 0 Vicryl sutures deep subcutaneous layers approximated with interrupted 0 and 2-0 Vicryl sutures and the skin closed with a running subcutaneous stitch of 4-0 Vicryl. Dermabond  was then applied. 30 cc of marcaine 1/2%1:1 exparel 1.3%instilled into the right knee via the hemovac drain. Adaptic 4 x 4's ABDs pads fixed to the skin with sterile webril an Ace wrap applied from the foot to the right upper thigh and knee immobilizer. All instrument and sponge counts were correct. Then reactivated extubated and returned to recovery room in satisfactory condition.  Benjiman Core, PA-C perform the duties of assistant surgeon he performed careful retraction of soft tissues assisted in addition the patient had removal on the OR table is present beginning the case the case and performed closure of the incision from the peritenon to the skin and application of dressing.   NITKA,JAMES E 09/06/2015, 4:31 PM

## 2015-09-06 NOTE — Discharge Instructions (Addendum)
° ° °Keep knee incision dry for 5 days post op then may wet while bathing. °Therapy daily and CPM goal full extension and greater than 90 degrees flexion. °Call if fever or chills or increased drainage. °Go to ER if acutely short of breath or call for ambulance. °Return for follow up in 2 weeks. °May full weight bear on the surgical leg unless told otherwise. °Use knee immobilizer until able to straight leg raise off bed with knee stable. °In house walking for first 2 weeks. °INSTRUCTIONS AFTER JOINT REPLACEMENT  ° °o Remove items at home which could result in a fall. This includes throw rugs or furniture in walking pathways °o ICE to the affected joint every three hours while awake for 30 minutes at a time, for at least the first 3-5 days, and then as needed for pain and swelling.  Continue to use ice for pain and swelling. You may notice swelling that will progress down to the foot and ankle.  This is normal after surgery.  Elevate your leg when you are not up walking on it.   °o Continue to use the breathing machine you got in the hospital (incentive spirometer) which will help keep your temperature down.  It is common for your temperature to cycle up and down following surgery, especially at night when you are not up moving around and exerting yourself.  The breathing machine keeps your lungs expanded and your temperature down. ° ° °DIET:  As you were doing prior to hospitalization, we recommend a well-balanced diet. ° °DRESSING / WOUND CARE / SHOWERING ° °You may change your dressing 3-5 days after surgery.  Then change the dressing every day with sterile gauze.  Please use good hand washing techniques before changing the dressing.  Do not use any lotions or creams on the incision until instructed by your surgeon. ° °ACTIVITY ° °o Increase activity slowly as tolerated, but follow the weight bearing instructions below.   °o No driving for 6 weeks or until further direction given by your physician.  You cannot  drive while taking narcotics.  °o No lifting or carrying greater than 10 lbs. until further directed by your surgeon. °o Avoid periods of inactivity such as sitting longer than an hour when not asleep. This helps prevent blood clots.  °o You may return to work once you are authorized by your doctor.  ° ° ° °WEIGHT BEARING  ° °Weight bearing as tolerated with assist device (walker, cane, etc) as directed, use it as long as suggested by your surgeon or therapist, typically at least 4-6 weeks. ° ° °EXERCISES ° °Results after joint replacement surgery are often greatly improved when you follow the exercise, range of motion and muscle strengthening exercises prescribed by your doctor. Safety measures are also important to protect the joint from further injury. Any time any of these exercises cause you to have increased pain or swelling, decrease what you are doing until you are comfortable again and then slowly increase them. If you have problems or questions, call your caregiver or physical therapist for advice.  ° °Rehabilitation is important following a joint replacement. After just a few days of immobilization, the muscles of the leg can become weakened and shrink (atrophy).  These exercises are designed to build up the tone and strength of the thigh and leg muscles and to improve motion. Often times heat used for twenty to thirty minutes before working out will loosen up your tissues and help with improving the range   of motion but do not use heat for the first two weeks following surgery (sometimes heat can increase post-operative swelling).  ° °These exercises can be done on a training (exercise) mat, on the floor, on a table or on a bed. Use whatever works the best and is most comfortable for you.    Use music or television while you are exercising so that the exercises are a pleasant break in your day. This will make your life better with the exercises acting as a break in your routine that you can look forward  to.   Perform all exercises about fifteen times, three times per day or as directed.  You should exercise both the operative leg and the other leg as well. ° °Exercises include: °  °• Quad Sets - Tighten up the muscle on the front of the thigh (Quad) and hold for 5-10 seconds.   °• Straight Leg Raises - With your knee straight (if you were given a brace, keep it on), lift the leg to 60 degrees, hold for 3 seconds, and slowly lower the leg.  Perform this exercise against resistance later as your leg gets stronger.  °• Leg Slides: Lying on your back, slowly slide your foot toward your buttocks, bending your knee up off the floor (only go as far as is comfortable). Then slowly slide your foot back down until your leg is flat on the floor again.  °• Angel Wings: Lying on your back spread your legs to the side as far apart as you can without causing discomfort.  °• Hamstring Strength:  Lying on your back, push your heel against the floor with your leg straight by tightening up the muscles of your buttocks.  Repeat, but this time bend your knee to a comfortable angle, and push your heel against the floor.  You may put a pillow under the heel to make it more comfortable if necessary.  ° °A rehabilitation program following joint replacement surgery can speed recovery and prevent re-injury in the future due to weakened muscles. Contact your doctor or a physical therapist for more information on knee rehabilitation.  ° ° °CONSTIPATION ° °Constipation is defined medically as fewer than three stools per week and severe constipation as less than one stool per week.  Even if you have a regular bowel pattern at home, your normal regimen is likely to be disrupted due to multiple reasons following surgery.  Combination of anesthesia, postoperative narcotics, change in appetite and fluid intake all can affect your bowels.  ° °YOU MUST use at least one of the following options; they are listed in order of increasing strength to get  the job done.  They are all available over the counter, and you may need to use some, POSSIBLY even all of these options:   ° °Drink plenty of fluids (prune juice may be helpful) and high fiber foods °Colace 100 mg by mouth twice a day  °Senokot for constipation as directed and as needed Dulcolax (bisacodyl), take with full glass of water  °Miralax (polyethylene glycol) once or twice a day as needed. ° °If you have tried all these things and are unable to have a bowel movement in the first 3-4 days after surgery call either your surgeon or your primary doctor.   ° °If you experience loose stools or diarrhea, hold the medications until you stool forms back up.  If your symptoms do not get better within 1 week or if they get worse, check with your   doctor.  If you experience "the worst abdominal pain ever" or develop nausea or vomiting, please contact the office immediately for further recommendations for treatment. ° ° °ITCHING:  If you experience itching with your medications, try taking only a single pain pill, or even half a pain pill at a time.  You can also use Benadryl over the counter for itching or also to help with sleep.  ° °TED HOSE STOCKINGS:  Use stockings on both legs until for at least 2 weeks or as directed by physician office. They may be removed at night for sleeping. ° °MEDICATIONS:  See your medication summary on the “After Visit Summary” that nursing will review with you.  You may have some home medications which will be placed on hold until you complete the course of blood thinner medication.  It is important for you to complete the blood thinner medication as prescribed. ° °PRECAUTIONS:  If you experience chest pain or shortness of breath - call 911 immediately for transfer to the hospital emergency department.  ° °If you develop a fever greater that 101 F, purulent drainage from wound, increased redness or drainage from wound, foul odor from the wound/dressing, or calf pain - CONTACT YOUR  SURGEON.   °                                                °FOLLOW-UP APPOINTMENTS:  If you do not already have a post-op appointment, please call the office for an appointment to be seen by your surgeon.  Guidelines for how soon to be seen are listed in your “After Visit Summary”, but are typically between 1-4 weeks after surgery. ° °OTHER INSTRUCTIONS:  ° °Knee Replacement:  Do not place pillow under knee, focus on keeping the knee straight while resting. CPM instructions: 0-90 degrees, 2 hours in the morning, 2 hours in the afternoon, and 2 hours in the evening. Place foam block, curve side up under heel at all times except when in CPM or when walking.  DO NOT modify, tear, cut, or change the foam block in any way. ° °MAKE SURE YOU:  °• Understand these instructions.  °• Get help right away if you are not doing well or get worse.  ° ° °Thank you for letting us be a part of your medical care team.  It is a privilege we respect greatly.  We hope these instructions will help you stay on track for a fast and full recovery!  ° °

## 2015-09-06 NOTE — Anesthesia Preprocedure Evaluation (Signed)
Anesthesia Evaluation  Patient identified by MRN, date of birth, ID band Patient awake    Reviewed: Allergy & Precautions, NPO status , Patient's Chart, lab work & pertinent test results  History of Anesthesia Complications Negative for: history of anesthetic complications  Airway Mallampati: III  TM Distance: >3 FB Neck ROM: Full    Dental  (+) Dental Advisory Given 9 teeth on the bottom. None loose. No teeth on the top.:   Pulmonary asthma , COPD, Current Smoker,    Pulmonary exam normal breath sounds clear to auscultation       Cardiovascular hypertension, Pt. on medications (-) angina(-) Past MI, (-) Cardiac Stents and (-) CABG Normal cardiovascular exam Rhythm:Regular Rate:Normal     Neuro/Psych  Headaches, Neuropathy, low back pain    GI/Hepatic Neg liver ROS, GERD  Controlled and Medicated,IBS   Endo/Other  diabetes, Type 2  Renal/GU negative Renal ROS     Musculoskeletal  (+) Arthritis ,   Abdominal   Peds  Hematology negative hematology ROS (+)   Anesthesia Other Findings HLD, gout  Reproductive/Obstetrics                            Anesthesia Physical Anesthesia Plan  ASA: III  Anesthesia Plan: General and Regional   Post-op Pain Management:  Regional for Post-op pain   Induction: Intravenous  Airway Management Planned: LMA  Additional Equipment:   Intra-op Plan:   Post-operative Plan:   Informed Consent: I have reviewed the patients History and Physical, chart, labs and discussed the procedure including the risks, benefits and alternatives for the proposed anesthesia with the patient or authorized representative who has indicated his/her understanding and acceptance.   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 She does not want a spinal.)        Anesthesia  Quick Evaluation

## 2015-09-06 NOTE — Transfer of Care (Signed)
Immediate Anesthesia Transfer of Care Note  Patient: Stacy [name]  Procedure(s) Performed: Procedure(s): RIGHT TOTAL KNEE ARTHROPLASTY (Right)  Patient Location: PACU  Anesthesia Type:General and Regional  Level of Consciousness: awake, alert  and oriented  Airway & Oxygen Therapy: Patient Spontanous Breathing and Patient connected to face mask oxygen  Post-op Assessment: Report given to RN and Post -op Vital signs reviewed and stable  Post vital signs: Reviewed and stable  Last Vitals:  Vitals:   09/06/15 0952  BP: (!) 141/90  Pulse: 93  Resp: 18  Temp: 37 C    Last Pain:  Vitals:   09/06/15 0952  TempSrc: Oral         Complications: No apparent anesthesia complications

## 2015-09-06 NOTE — Anesthesia Procedure Notes (Signed)
Procedure Name: LMA Insertion Date/Time: 09/06/2015 1:24 PM Performed by: Virgel Gess LEFFEW Pre-anesthesia Checklist: Patient identified, Emergency Drugs available, Suction available and Patient being monitored Patient Re-evaluated:Patient Re-evaluated prior to inductionOxygen Delivery Method: Circle System Utilized Preoxygenation: Pre-oxygenation with 100% oxygen Intubation Type: IV induction Ventilation: Mask ventilation without difficulty LMA: LMA inserted LMA Size: 4.0 Number of attempts: 1 Airway Equipment and Method: Bite block Placement Confirmation: positive ETCO2 Tube secured with: Tape Dental Injury: Teeth and Oropharynx as per pre-operative assessment  Comments: LMA inserted by Linus Orn

## 2015-09-07 LAB — CBC
HCT: 33.8 % — ABNORMAL LOW (ref 36.0–46.0)
Hemoglobin: 11.2 g/dL — ABNORMAL LOW (ref 12.0–15.0)
MCH: 27.3 pg (ref 26.0–34.0)
MCHC: 33.1 g/dL (ref 30.0–36.0)
MCV: 82.4 fL (ref 78.0–100.0)
Platelets: 231 10*3/uL (ref 150–400)
RBC: 4.1 MIL/uL (ref 3.87–5.11)
RDW: 13.8 % (ref 11.5–15.5)
WBC: 11 10*3/uL — ABNORMAL HIGH (ref 4.0–10.5)

## 2015-09-07 LAB — GLUCOSE, CAPILLARY
Glucose-Capillary: 157 mg/dL — ABNORMAL HIGH (ref 65–99)
Glucose-Capillary: 172 mg/dL — ABNORMAL HIGH (ref 65–99)
Glucose-Capillary: 119 mg/dL — ABNORMAL HIGH (ref 65–99)

## 2015-09-07 MED ORDER — KETOROLAC TROMETHAMINE 30 MG/ML IJ SOLN
30.0000 mg | Freq: Four times a day (QID) | INTRAMUSCULAR | Status: DC | PRN
  Administered 2015-09-07: 30 mg via INTRAVENOUS
  Filled 2015-09-07: qty 1

## 2015-09-07 MED ORDER — MORPHINE SULFATE (PF) 4 MG/ML IV SOLN
4.0000 mg | INTRAVENOUS | Status: DC | PRN
  Administered 2015-09-07 – 2015-09-08 (×2): 4 mg via INTRAVENOUS
  Filled 2015-09-07 (×2): qty 1

## 2015-09-07 NOTE — Evaluation (Signed)
Physical Therapy Evaluation Patient Details Name: Stacy [name] MRN: [id number] DOB: 09/26/1960 Today's Date: 09/07/2015   History of Present Illness  Pt presents for right TKA with PMH of DM, COPD, GERD, spinal stenosis with decompression L2-5, gout, HTN, asthma, HLD, neuropathy  Clinical Impression  Pt is s/p TKA resulting in the deficits listed below (see PT Problem List). Pt ambulated 25' with RW and min A.  Pt will benefit from skilled PT to increase their independence and safety with mobility to allow discharge to the venue listed below.      Follow Up Recommendations Home health PT;Supervision - Intermittent    Equipment Recommendations  None recommended by PT    Recommendations for Other Services OT consult     Precautions / Restrictions Precautions Precautions: Knee Precaution Booklet Issued: No Precaution Comments: reviewed proper positioning Required Braces or Orthoses: Knee Immobilizer - Right Knee Immobilizer - Right: On when out of bed or walking Restrictions Weight Bearing Restrictions: Yes RLE Weight Bearing: Weight bearing as tolerated      Mobility  Bed Mobility Overal bed mobility: Needs Assistance Bed Mobility: Supine to Sit     Supine to sit: Min assist     General bed mobility comments: min A to RLE for moving to EOB, pt with increased pain with mobility  Transfers Overall transfer level: Needs assistance Equipment used: Rolling walker (2 wheeled) Transfers: Sit to/from Stand Sit to Stand: Min assist         General transfer comment: min A to steady, vc's for hand placement  Ambulation/Gait Ambulation/Gait assistance: Min assist Ambulation Distance (Feet): 25 Feet Assistive device: Rolling walker (2 wheeled) Gait Pattern/deviations: Step-to pattern Gait velocity: decreased Gait velocity interpretation: <1.8 ft/sec, indicative of risk for recurrent falls General Gait Details: vc's for putting RLE on floor and beginning to WB. vc's for  sequencing. Pt with slow labored gait  Stairs            Wheelchair Mobility    Modified Rankin (Stroke Patients Only)       Balance Overall balance assessment: Needs assistance Sitting-balance support: No upper extremity supported Sitting balance-Leahy Scale: Good     Standing balance support: Bilateral upper extremity supported Standing balance-Leahy Scale: Poor Standing balance comment: heavily reliant on RW                             Pertinent Vitals/Pain Pain Assessment: Faces Faces Pain Scale: Hurts whole lot Pain Location: right knee Pain Descriptors / Indicators: Aching Pain Intervention(s): Limited activity within patient's tolerance;Premedicated before session;Monitored during session;Repositioned    Home Living Family/patient expects to be discharged to:: Private residence Living Arrangements: Spouse/significant other Available Help at Discharge: Family;Available 24 hours/day Type of Home: House Home Access: Stairs to enter Entrance Stairs-Rails: None Entrance Stairs-Number of Steps: 2 Home Layout: Two level Home Equipment: Walker - 2 wheels;Bedside commode Additional Comments: pt can stay downstairs in home    Prior Function Level of Independence: Needs assistance   Gait / Transfers Assistance Needed: ambulated with RW after back surgery earlier this year  ADL's / Homemaking Assistance Needed: assist with cooking and cleaning        Hand Dominance        Extremity/Trunk Assessment   Upper Extremity Assessment: Defer to OT evaluation           Lower Extremity Assessment: RLE deficits/detail RLE Deficits / Details: hip flex 2/5, knee flex/ ext 2/5. Pt tends  to keep RLE internally rotated. Able to correct with manual assistance    Cervical / Trunk Assessment: Normal  Communication   Communication: No difficulties  Cognition Arousal/Alertness: Awake/alert Behavior During Therapy: WFL for tasks assessed/performed Overall  Cognitive Status: Within Functional Limits for tasks assessed                      General Comments General comments (skin integrity, edema, etc.): pt's husband will be at home with her    Exercises Total Joint Exercises Ankle Circles/Pumps: AROM;Both;15 reps;Supine Quad Sets: AROM;Both;10 reps;Supine Goniometric ROM: 10-60      Assessment/Plan    PT Assessment Patient needs continued PT services  PT Diagnosis Difficulty walking;Abnormality of gait;Acute pain   PT Problem List Decreased strength;Decreased range of motion;Decreased activity tolerance;Decreased balance;Decreased mobility;Decreased knowledge of use of DME;Decreased knowledge of precautions;Pain  PT Treatment Interventions DME instruction;Gait training;Stair training;Functional mobility training;Therapeutic activities;Therapeutic exercise;Balance training;Patient/family education   PT Goals (Current goals can be found in the Care Plan section) Acute Rehab PT Goals Patient Stated Goal: return home PT Goal Formulation: With patient Time For Goal Achievement: 09/14/15 Potential to Achieve Goals: Good    Frequency 7X/week   Barriers to discharge        Co-evaluation               End of Session Equipment Utilized During Treatment: Gait belt;Right knee immobilizer Activity Tolerance: Patient tolerated treatment well Patient left: in chair;with call bell/phone within reach Nurse Communication: Mobility status         Time: 3614-4315 PT Time Calculation (min) (ACUTE ONLY): 17 min   Charges:   PT Evaluation $PT Eval Low Complexity: 1 Procedure     PT G Codes:      Lyanne Co, PT  Acute Rehab Services  [phone]   Lyanne Co 09/07/2015, 9:47 AM

## 2015-09-07 NOTE — Progress Notes (Signed)
Orthopedic Tech Progress Note Patient Details:  Stacy [name] January 01, 1961 [id number] Ortho visit put on cpm at 1805 Patient ID: Stacy [name], female   DOB: 1960-04-02, 55 y.o.   MRN: [id number]   [name] 09/07/2015, 6:06 PM

## 2015-09-07 NOTE — Care Management Note (Signed)
Case Management Note  Patient Details  Name: Stacy [name] MRN: [id number] Date of Birth: 1960-06-25  Subjective/Objective:  55 yr old female s/p right total knee arthroplasty.                  Action/Plan: Case manager spoke with patient concerning Home Health and DME needs. Choice was offered for Home Health Agency, referral was called to Beatriz Stallion, [facility]. Patient states she has rolling walker and 3in1. Will have family support at discharge.   Expected Discharge Date:   09/07/15               Expected Discharge Plan:  Home w Home Health Services  In-House Referral:     Discharge planning Services  CM Consult  Post Acute Care Choice:  Home Health Choice offered to:  Patient  DME Arranged:  N/A DME Agency:     HH Arranged:  PT HH Agency:  [facility]  Status of Service:  Completed, signed off  If discussed at Long Length of Stay Meetings, dates discussed:    Additional Comments:  Durenda Guthrie, RN 09/07/2015, 10:03 AM

## 2015-09-07 NOTE — Progress Notes (Signed)
Orthopedic Tech Progress Note Patient Details:  Stacy [name] 1960-04-03 [id number]  CPM Right Knee CPM Right Knee: On Right Knee Flexion (Degrees): 20 Right Knee Extension (Degrees): 0 Additional Comments: i just applied cpm to pt. for first time. pt havingis having alot of pain and already had pain meds. nurse tech is going to come back in 30 mins to turn up flexion if pt. can handle it.   Trinna Post 09/07/2015, 6:39 AM

## 2015-09-07 NOTE — Progress Notes (Signed)
Subjective: 1 Day Post-Op Procedure(s) (LRB): RIGHT TOTAL KNEE ARTHROPLASTY (Right) Patient reports pain as severe.    Objective: Vital signs in last 24 hours: Temp:  [97.8 F (36.6 C)-100.1 F (37.8 C)] 99.9 F (37.7 C) (08/05 0425) Pulse Rate:  [68-95] 69 (08/05 0425) Resp:  [16-18] 16 (08/05 0425) BP: (141-185)/(77-107) 153/82 (08/05 0425) SpO2:  [97 %-100 %] 100 % (08/05 0425) Weight:  [74.8 kg (165 lb)] 74.8 kg (165 lb) (08/04 0952)  Intake/Output from previous day: 08/04 0701 - 08/05 0700 In: 1400 [I.V.:1400] Out: 1125 [Urine:1050; Blood:75] Intake/Output this shift: No intake/output data recorded.   Recent Labs  09/07/15 0547  HGB 11.2*    Recent Labs  09/07/15 0547  WBC 11.0*  RBC 4.10  HCT 33.8*  PLT 231   No results for input(s): NA, K, CL, CO2, BUN, CREATININE, GLUCOSE, CALCIUM in the last 72 hours. No results for input(s): LABPT, INR in the last 72 hours.  Neurologically intact  Assessment/Plan: 1 Day Post-Op Procedure(s) (LRB): RIGHT TOTAL KNEE ARTHROPLASTY (Right) Up with therapy  Eyanna Mcgonagle C 09/07/2015, 9:05 AM

## 2015-09-07 NOTE — Care Management (Signed)
Setup with [facility], has DME

## 2015-09-07 NOTE — Evaluation (Signed)
Occupational Therapy Evaluation Patient Details Name: Stacy [name] MRN: [id number] DOB: [birth year] Today's Date: 09/07/2015    History of Present Illness (P) Pt presents for right TKA with PMH of DM, COPD, GERD, spinal stenosis with decompression L2-5, gout, HTN, asthma, HLD, neuropathy   Clinical Impression   PTA pt independent in ADL, dependent in IADL due to back surgery earlier this year. Pt with deficits in independence listed below. Pt will continue to benefit from OT in the acute care setting to improve independence in ADL, safety, anf use of AE. OT to follow acutely. PT provided with AE kit    Follow Up Recommendations  Home health OT    Equipment Recommendations  Other (comment) (AE kit)    Recommendations for Other Services       Precautions / Restrictions Precautions Precautions: (P) Knee Precaution Booklet Issued: (P) No Precaution Comments: (P) reviewed proper positioning Required Braces or Orthoses: (P) Knee Immobilizer - Right Knee Immobilizer - Right: (P) On when out of bed or walking Restrictions Weight Bearing Restrictions: (P) Yes RLE Weight Bearing: (P) Weight bearing as tolerated      Mobility Bed Mobility Overal bed mobility: (P) Needs Assistance Bed Mobility: (P) Supine to Sit     Supine to sit: (P) Min assist     General bed mobility comments: (P) min A to RLE for moving to EOB, pt with increased pain with mobility  Transfers Overall transfer level: (P) Needs assistance Equipment used: (P) Rolling walker (2 wheeled) Transfers: Sit to/from Stand Sit to Stand: (P) Min assist         General transfer comment: (P) min A to steady, vc's for hand placement    Balance Overall balance assessment: Needs assistance Sitting-balance support: No upper extremity supported Sitting balance-Leahy Scale: (P) Good     Standing balance support: Bilateral upper extremity supported Standing balance-Leahy Scale: (P) Poor                               ADL Overall ADL's : Needs assistance/impaired Eating/Feeding: Set up;Sitting   Grooming: Set up;Sitting   Upper Body Bathing: Min guard;Sitting   Lower Body Bathing: With adaptive equipment;Minimal assistance;Cueing for safety;Cueing for sequencing;Sitting/lateral leans Lower Body Bathing Details (indicate cue type and reason): Pt educated in use of long handled sponge Upper Body Dressing : Min guard;Sitting   Lower Body Dressing: Minimal assistance;Cueing for sequencing;Cueing for compensatory techniques;With adaptive equipment;Sitting/lateral leans Lower Body Dressing Details (indicate cue type and reason): Pt educated and practiced with reacher/grabber, sock donner, shoe horn Toilet Transfer: Min guard;Cueing for sequencing (vc for hand placement)   Toileting- Clothing Manipulation and Hygiene: Moderate assistance   Tub/ Shower Transfer: Tub transfer;Minimal assistance;Tub bench Tub/Shower Transfer Details (indicate cue type and reason): Pt educated on safety of making sure that water is off floor due to tub bench allowing water outside the shower Functional mobility during ADLs: Rolling walker;Min guard General ADL Comments: Pt's husband has had BIL knee replacements and she doesn't think that he will be able to help her with LB ADL. Pt does have grandaughter who can help intermit     Vision     Perception     Praxis      Pertinent Vitals/Pain Pain Assessment: 0-10 Pain Score: 5  Faces Pain Scale: (P) Hurts whole lot Pain Location: R knee Pain Descriptors / Indicators: Aching;Discomfort;Grimacing;Guarding;Moaning;Shooting;Sore Pain Intervention(s): Limited activity within patient's tolerance;Monitored during session;Repositioned  Hand Dominance Left   Extremity/Trunk Assessment Upper Extremity Assessment Upper Extremity Assessment: Overall WFL for tasks assessed   Lower Extremity Assessment Lower Extremity Assessment: RLE deficits/detail;Defer to  PT evaluation   Cervical / Trunk Assessment Cervical / Trunk Assessment: Normal   Communication Communication Communication: No difficulties   Cognition Arousal/Alertness: (P) Awake/alert Behavior During Therapy: (P) WFL for tasks assessed/performed Overall Cognitive Status: (P) Within Functional Limits for tasks assessed                     General Comments       Exercises      Shoulder Instructions      Home Living Family/patient expects to be discharged to:: Private residence Living Arrangements: Spouse/significant other Available Help at Discharge: Family;Available 24 hours/day Type of Home: House Home Access: Stairs to enter [employer] of Steps: 2 Entrance Stairs-Rails: None Home Layout: Two level Alternate Level Stairs-Number of Steps: 13 Alternate Level Stairs-Rails: Left Bathroom Shower/Tub: Tub/shower unit Shower/tub characteristics: Architectural technologist: Standard Bathroom Accessibility: Yes How Accessible: Accessible via walker Home Equipment: [place] - 2 wheels;Bedside commode;Tub bench   Additional Comments: Pt can stay downstairs in home      Prior Functioning/Environment Level of Independence: Needs assistance  Gait / Transfers Assistance Needed: ambulated with RW after back surgery earlier this year ADL's / Homemaking Assistance Needed: assist with cooking and cleaning        OT Diagnosis: Acute pain   OT Problem List: Decreased strength;Decreased range of motion;Decreased activity tolerance;Impaired balance (sitting and/or standing);Decreased knowledge of use of DME or AE;Pain   OT Treatment/Interventions:      OT Goals(Current goals can be found in the care plan section) Acute Rehab OT Goals Patient Stated Goal: (P) return home OT Goal Formulation: With patient Time For Goal Achievement: 09/14/15 Potential to Achieve Goals: Good ADL Goals Pt Will Perform Lower Body Bathing: with supervision;with adaptive  equipment;sitting/lateral leans Pt Will Perform Lower Body Dressing: with supervision;with adaptive equipment;sitting/lateral leans Pt Will Transfer to Toilet: with supervision;ambulating;bedside commode Pt Will Perform Toileting - Clothing Manipulation and hygiene: with supervision;sit to/from stand Pt Will Perform Tub/Shower Transfer: Tub transfer;with supervision;tub bench;ambulating;rolling walker  OT Frequency: Min 2X/week   Barriers to D/C:            Co-evaluation              End of Session Equipment Utilized During Treatment: Gait belt;Rolling walker;Other (comment) (AE kit) CPM Right Knee CPM Right Knee: Off  Activity Tolerance: Patient tolerated treatment well Patient left: Other (comment) (with PT for ambulation)   Time: 6701-4103 OT Time Calculation (min): 30 min Charges:  OT General Charges $OT Visit: 1 Procedure OT Evaluation $OT Eval Low Complexity: 1 Procedure OT Treatments $Self Care/Home Management : 8-22 mins G-Codes:    Merri Ray Lakeva Hollon OTR/L 09/07/2015, 2:04 PM [phone]

## 2015-09-07 NOTE — Progress Notes (Signed)
Physical Therapy Treatment Patient Details Name: Stacy [name] MRN: [id number] DOB: 1960-08-04 Today's Date: 09/07/2015    History of Present Illness Pt presents for right TKA with PMH of DM, COPD, GERD, spinal stenosis with decompression L2-5, gout, HTN, asthma, HLD, neuropathy    PT Comments    Pt with decreased pain this afternoon, ambulated 60' with RW and min guard A. PT will continue to follow.   Follow Up Recommendations  Home health PT;Supervision - Intermittent     Equipment Recommendations  None recommended by PT    Recommendations for Other Services       Precautions / Restrictions Precautions Precautions: Knee Precaution Booklet Issued: No Precaution Comments: reviewed proper positioning Required Braces or Orthoses: Knee Immobilizer - Right Knee Immobilizer - Right: On when out of bed or walking Restrictions Weight Bearing Restrictions: Yes RLE Weight Bearing: Weight bearing as tolerated    Mobility  Bed Mobility Overal bed mobility: Needs Assistance Bed Mobility: Supine to Sit;Sit to Supine     Supine to sit: Min assist Sit to supine: Min assist   General bed mobility comments: min A to RLE for OOB and back in, pt able to manage upper body without assist  Transfers Overall transfer level: Needs assistance Equipment used: Rolling walker (2 wheeled) Transfers: Sit to/from Stand Sit to Stand: Min guard         General transfer comment: pt with safe hand placement, min-guard A for safety. Pt with increased pain with stand to sit despite sliding right LE out first  Ambulation/Gait Ambulation/Gait assistance: Min guard Ambulation Distance (Feet): 60 Feet Assistive device: Rolling walker (2 wheeled) Gait Pattern/deviations: Step-through pattern;Decreased weight shift to right Gait velocity: decreased Gait velocity interpretation: Below normal speed for age/gender General Gait Details: continuing to work on [employer] right side and getting right heel  to floor. Tolerated increased distance this afternoon   Stairs            Wheelchair Mobility    Modified Rankin (Stroke Patients Only)       Balance Overall balance assessment: Needs assistance Sitting-balance support: No upper extremity supported Sitting balance-Leahy Scale: Good     Standing balance support: Bilateral upper extremity supported Standing balance-Leahy Scale: Poor Standing balance comment: reliance on RW                    Cognition Arousal/Alertness: Awake/alert Behavior During Therapy: WFL for tasks assessed/performed Overall Cognitive Status: Within Functional Limits for tasks assessed                      Exercises Total Joint Exercises Ankle Circles/Pumps: AROM;Both;15 reps;Supine Quad Sets: AROM;Both;10 reps;Supine Goniometric ROM: 10-60    General Comments        Pertinent Vitals/Pain Pain Assessment: Faces Pain Score: 5  Faces Pain Scale: Hurts little more Pain Location: right knee Pain Descriptors / Indicators: Aching Pain Intervention(s): Limited activity within patient's tolerance;Monitored during session;Premedicated before session    Home Living Family/patient expects to be discharged to:: Private residence Living Arrangements: Spouse/significant other Available Help at Discharge: Family;Available 24 hours/day Type of Home: House Home Access: Stairs to enter Entrance Stairs-Rails: None Home Layout: Two level Home Equipment: Environmental consultant - 2 wheels;Bedside commode;Tub bench Additional Comments: Pt can stay downstairs in home    Prior Function Level of Independence: Needs assistance  Gait / Transfers Assistance Needed: ambulated with RW after back surgery earlier this year ADL's / Homemaking Assistance Needed: assist with cooking and cleaning  PT Goals (current goals can now be found in the care plan section) Acute Rehab PT Goals Patient Stated Goal: return home PT Goal Formulation: With patient Time For  Goal Achievement: 09/14/15 Potential to Achieve Goals: Good Progress towards PT goals: Progressing toward goals    Frequency  7X/week    PT Plan Current plan remains appropriate    Co-evaluation             End of Session Equipment Utilized During Treatment: Gait belt Activity Tolerance: Patient tolerated treatment well Patient left: with call bell/phone within reach;in bed     Time: 6010-9323 PT Time Calculation (min) (ACUTE ONLY): 18 min  Charges:  $Gait Training: 8-22 mins                    G Codes:     Lyanne Co, PT  Acute Rehab Services  [phone]  [place], [place] 09/07/2015, 2:09 PM

## 2015-09-08 LAB — GLUCOSE, CAPILLARY
Glucose-Capillary: 154 mg/dL — ABNORMAL HIGH (ref 65–99)
Glucose-Capillary: 153 mg/dL — ABNORMAL HIGH (ref 65–99)
Glucose-Capillary: 161 mg/dL — ABNORMAL HIGH (ref 65–99)
Glucose-Capillary: 164 mg/dL — ABNORMAL HIGH (ref 65–99)

## 2015-09-08 LAB — CBC
HCT: 32.3 % — ABNORMAL LOW (ref 36.0–46.0)
Hemoglobin: 10.6 g/dL — ABNORMAL LOW (ref 12.0–15.0)
MCH: 26.9 pg (ref 26.0–34.0)
MCHC: 32.8 g/dL (ref 30.0–36.0)
MCV: 82 fL (ref 78.0–100.0)
Platelets: 216 10*3/uL (ref 150–400)
RBC: 3.94 MIL/uL (ref 3.87–5.11)
RDW: 13.8 % (ref 11.5–15.5)
WBC: 12.9 10*3/uL — ABNORMAL HIGH (ref 4.0–10.5)

## 2015-09-08 NOTE — Anesthesia Postprocedure Evaluation (Signed)
Anesthesia Post Note  Patient: Tami Linamela Nilson  Procedure(s) Performed: Procedure(s) (LRB): RIGHT TOTAL KNEE ARTHROPLASTY (Right)  Patient location during evaluation: PACU Anesthesia Type: General and Regional Level of consciousness: awake and alert Pain management: pain level controlled Vital Signs Assessment: post-procedure vital signs reviewed and stable Respiratory status: spontaneous breathing, nonlabored ventilation, respiratory function stable and patient connected to nasal cannula oxygen Cardiovascular status: blood pressure returned to baseline and stable Postop Assessment: no signs of nausea or vomiting Anesthetic complications: no    Last Vitals:  Vitals:   09/08/15 1441 09/08/15 1930  BP: 137/71 123/68  Pulse: 89 97  Resp:  16  Temp: (!) 38.1 C 36.9 C    Last Pain:  Vitals:   09/08/15 2035  TempSrc:   PainSc: 5                  Josias Tomerlin,W. EDMOND

## 2015-09-08 NOTE — Progress Notes (Signed)
Subjective: 2 Days Post-Op Procedure(s) (LRB): RIGHT TOTAL KNEE ARTHROPLASTY (Right) Patient reports pain as moderate.    Objective: Vital signs in last 24 hours: Temp:  [99.9 F (37.7 C)-102.6 F (39.2 C)] 100.5 F (38.1 C) (08/06 1441) Pulse Rate:  [89-107] 89 (08/06 1441) Resp:  [15-16] 15 (08/06 0502) BP: (109-148)/(71-95) 137/71 (08/06 1441) SpO2:  [90 %-100 %] 100 % (08/06 1441)  Intake/Output from previous day: No intake/output data recorded. Intake/Output this shift: No intake/output data recorded.   Recent Labs  09/07/15 0547 09/08/15 0523  HGB 11.2* 10.6*    Recent Labs  09/07/15 0547 09/08/15 0523  WBC 11.0* 12.9*  RBC 4.10 3.94  HCT 33.8* 32.3*  PLT 231 216   No results for input(s): NA, K, CL, CO2, BUN, CREATININE, GLUCOSE, CALCIUM in the last 72 hours. No results for input(s): LABPT, INR in the last 72 hours.  Neurologically intact  Assessment/Plan: 2 Days Post-Op Procedure(s) (LRB): RIGHT TOTAL KNEE ARTHROPLASTY (Right) Up with therapy  YATES,MARK C 09/08/2015, 4:39 PM

## 2015-09-08 NOTE — Progress Notes (Signed)
Physical Therapy Treatment Patient Details Name: Adela Esteban MRN: [id number] DOB: 03/09/1960 Today's Date: 09/08/2015    History of Present Illness Pt presents for right TKA with PMH of DM, COPD, GERD, spinal stenosis with decompression L2-5, gout, HTN, asthma, HLD, neuropathy    PT Comments    Continue per POC. Pt with c/o feeling lightheaded while ambulating in hallway. She took a seated rest break x 2 minutes and then was able to ambulate back to her room.  Follow Up Recommendations  Home health PT;Supervision - Intermittent     Equipment Recommendations  None recommended by PT    Recommendations for Other Services       Precautions / Restrictions Precautions Precautions: Knee Precaution Comments: reviewed proper positioning Required Braces or Orthoses: Knee Immobilizer - Right Knee Immobilizer - Right: On when out of bed or walking Restrictions RLE Weight Bearing: Weight bearing as tolerated    Mobility  Bed Mobility         Supine to sit: Min assist     General bed mobility comments: assist with RLE, +rail  Transfers   Equipment used: Rolling walker (2 wheeled)   Sit to Stand: Min guard         General transfer comment: Pt demo good technique.  Ambulation/Gait Ambulation/Gait assistance: Min guard Ambulation Distance (Feet): 100 Feet Assistive device: Rolling walker (2 wheeled) Gait Pattern/deviations: Step-to pattern;Antalgic;Decreased stride length Gait velocity: decreased Gait velocity interpretation: Below normal speed for age/gender General Gait Details: Pt with c/o feeling lightheaded during ambulation requiring seated rest break after 50 feet. Pt able to ambulate back to room after 2 minute of sitting in chair.   Stairs            Wheelchair Mobility    Modified Rankin (Stroke Patients Only)       Balance   Sitting-balance support: No upper extremity supported;Feet supported Sitting balance-Leahy Scale: Good     Standing  balance support: Bilateral upper extremity supported;During functional activity Standing balance-Leahy Scale: Poor                      Cognition Arousal/Alertness: Awake/alert Behavior During Therapy: WFL for tasks assessed/performed Overall Cognitive Status: Within Functional Limits for tasks assessed                      Exercises Total Joint Exercises Ankle Circles/Pumps: AROM;Both;10 reps;Supine Quad Sets: AROM;Right;10 reps;Supine Heel Slides: AAROM;Right;10 reps;Supine Hip ABduction/ADduction: AROM;Right;10 reps;Supine Goniometric ROM: 10-50 R knee    General Comments        Pertinent Vitals/Pain Pain Assessment: 0-10 Pain Score: 7  Pain Location: R knee Pain Descriptors / Indicators: Sore Pain Intervention(s): Monitored during session;Repositioned;Patient requesting pain meds-RN notified    Home Living                      Prior Function            PT Goals (current goals can now be found in the care plan section) Acute Rehab PT Goals Patient Stated Goal: return home PT Goal Formulation: With patient Time For Goal Achievement: 09/14/15 Potential to Achieve Goals: Good Progress towards PT goals: Progressing toward goals    Frequency  7X/week    PT Plan Current plan remains appropriate    Co-evaluation             End of Session Equipment Utilized During Treatment: Gait belt Activity Tolerance: Patient tolerated treatment well Patient  left: in chair;with call bell/phone within reach     Time: 1033-1107 PT Time Calculation (min) (ACUTE ONLY): 34 min  Charges:  $Gait Training: 8-22 mins $Therapeutic Exercise: 8-22 mins                    G Codes:      Ilda FoilGarrow, Charline Hoskinson Rene 09/08/2015, 12:31 PM

## 2015-09-08 NOTE — Progress Notes (Signed)
Physical Therapy Treatment Patient Details Name: Stacy [name] MRN: [id number] DOB: 22-Nov-1960 Today's Date: 09/08/2015    History of Present Illness Pt presents for right TKA with PMH of DM, COPD, GERD, spinal stenosis with decompression L2-5, gout, HTN, asthma, HLD, neuropathy    PT Comments    Pt progressing well. She will need to complete stair training prior to d/c. Pt reports she has 2 steps to enter house but they are wide enough to fit all 4 legs of the walker.   Follow Up Recommendations  Home health PT;Supervision - Intermittent     Equipment Recommendations  None recommended by PT    Recommendations for Other Services       Precautions / Restrictions Precautions Precautions: Knee Precaution Comments: reviewed proper positioning Required Braces or Orthoses: Knee Immobilizer - Right Knee Immobilizer - Right: On when out of bed or walking Restrictions RLE Weight Bearing: Weight bearing as tolerated    Mobility  Bed Mobility         Supine to sit: Min assist Sit to supine: Min assist   General bed mobility comments: assist with RLE, +rail  Transfers   Equipment used: Rolling walker (2 wheeled) Transfers: Stand Pivot Transfers Sit to Stand: Min guard Stand pivot transfers: Min guard       General transfer comment: Pt demo good technique.  Ambulation/Gait Ambulation/Gait assistance: Min guard Ambulation Distance (Feet): 100 Feet Assistive device: Rolling walker (2 wheeled) Gait Pattern/deviations: Step-to pattern;Antalgic;Decreased stride length Gait velocity: decreased Gait velocity interpretation: Below normal speed for age/gender General Gait Details: Pt with c/o feeling lightheaded during ambulation requiring seated rest break after 50 feet. Pt able to ambulate back to room after 2 minute of sitting in chair.   Stairs            Wheelchair Mobility    Modified Rankin (Stroke Patients Only)       Balance   Sitting-balance support:  No upper extremity supported;Feet supported Sitting balance-Leahy Scale: Good     Standing balance support: Bilateral upper extremity supported;During functional activity Standing balance-Leahy Scale: Poor                      Cognition Arousal/Alertness: Awake/alert Behavior During Therapy: WFL for tasks assessed/performed Overall Cognitive Status: Within Functional Limits for tasks assessed                      Exercises Total Joint Exercises Ankle Circles/Pumps: AROM;Both;10 reps;Supine Quad Sets: AROM;Right;10 reps;Supine Heel Slides: AAROM;Right;10 reps;Supine Hip ABduction/ADduction: AROM;Right;10 reps;Supine Goniometric ROM: 10-50 R knee    General Comments        Pertinent Vitals/Pain Pain Assessment: 0-10 Pain Score: 4  Pain Location: R knee Pain Descriptors / Indicators: Sore Pain Intervention(s): Monitored during session;Repositioned    Home Living                      Prior Function            PT Goals (current goals can now be found in the care plan section) Acute Rehab PT Goals Patient Stated Goal: return home PT Goal Formulation: With patient Time For Goal Achievement: 09/14/15 Potential to Achieve Goals: Good Progress towards PT goals: Progressing toward goals    Frequency  7X/week    PT Plan Current plan remains appropriate    Co-evaluation             End of Session Equipment Utilized During Treatment:  Gait belt Activity Tolerance: Patient tolerated treatment well Patient left: in bed;in CPM;with call bell/phone within reach     Time: 1346-1402 PT Time Calculation (min) (ACUTE ONLY): 16 min  Charges:  $Gait Training: 8-22 mins $Therapeutic Exercise: 8-22 mins $Therapeutic Activity: 8-22 mins                    G Codes:      Ilda FoilGarrow, Roseana Rhine Rene 09/08/2015, 2:14 PM

## 2015-09-09 ENCOUNTER — Encounter (HOSPITAL_COMMUNITY): Payer: Self-pay | Admitting: Specialist

## 2015-09-09 LAB — CBC
HCT: 28.2 % — ABNORMAL LOW (ref 36.0–46.0)
Hemoglobin: 9.4 g/dL — ABNORMAL LOW (ref 12.0–15.0)
MCH: 27.2 pg (ref 26.0–34.0)
MCHC: 33.3 g/dL (ref 30.0–36.0)
MCV: 81.5 fL (ref 78.0–100.0)
Platelets: 201 10*3/uL (ref 150–400)
RBC: 3.46 MIL/uL — ABNORMAL LOW (ref 3.87–5.11)
RDW: 13.8 % (ref 11.5–15.5)
WBC: 11.7 10*3/uL — ABNORMAL HIGH (ref 4.0–10.5)

## 2015-09-09 LAB — GLUCOSE, CAPILLARY
Glucose-Capillary: 147 mg/dL — ABNORMAL HIGH (ref 65–99)
Glucose-Capillary: 179 mg/dL — ABNORMAL HIGH (ref 65–99)
Glucose-Capillary: 170 mg/dL — ABNORMAL HIGH (ref 65–99)

## 2015-09-09 LAB — HEMOGLOBIN A1C
Hgb A1c MFr Bld: 6.8 % — ABNORMAL HIGH (ref 4.8–5.6)
Mean Plasma Glucose: 148 mg/dL

## 2015-09-09 MED ORDER — DOCUSATE SODIUM 100 MG PO CAPS: 100.0000 mg | ORAL_CAPSULE | Freq: Two times a day (BID) | ORAL | 1 refills | Status: DC

## 2015-09-09 MED ORDER — OXYCODONE HCL 5 MG PO TABS: 5.0000 mg | ORAL_TABLET | ORAL | 0 refills | Status: DC | PRN

## 2015-09-09 MED ORDER — METHOCARBAMOL 500 MG PO TABS: 500.0000 mg | ORAL_TABLET | Freq: Four times a day (QID) | ORAL | 2 refills | Status: DC | PRN

## 2015-09-09 MED ORDER — RIVAROXABAN 10 MG PO TABS: 10.0000 mg | ORAL_TABLET | Freq: Every day | ORAL | 0 refills | Status: DC

## 2015-09-09 MED ORDER — OXYCODONE HCL ER 10 MG PO T12A: 10.0000 mg | EXTENDED_RELEASE_TABLET | Freq: Two times a day (BID) | ORAL | 0 refills | Status: DC

## 2015-09-09 NOTE — Progress Notes (Signed)
Physical Therapy Treatment Patient Details Name: Stacy [name] MRN: [id number] DOB: 26-Sep-1960 Today's Date: 09/09/2015    History of Present Illness Pt presents for right TKA with PMH of DM, COPD, GERD, spinal stenosis with decompression L2-5, gout, HTN, asthma, HLD, neuropathy    PT Comments    Pt performed increased gait during tx session.  Increased time to complete with extremely slow cadence. Pt reports increased pain post gait and stair training which limited exercise progression.  ROM limited as well.  Pt would benefit from pm session before returning home.     Follow Up Recommendations  Home health PT;Supervision - Intermittent     Equipment Recommendations  None recommended by PT    Recommendations for Other Services       Precautions / Restrictions Precautions Precautions: Knee Precaution Booklet Issued: No Precaution Comments: reviewed proper positioning Required Braces or Orthoses: Knee Immobilizer - Right Knee Immobilizer - Right: On when out of bed or walking Restrictions Weight Bearing Restrictions: Yes RLE Weight Bearing: Weight bearing as tolerated    Mobility  Bed Mobility Overal bed mobility: Needs Assistance Bed Mobility: Supine to Sit;Sit to Supine     Supine to sit: Supervision Sit to supine: Supervision   General bed mobility comments: Pt required increased time to complete, self assisting RLE in and out of bed.    Transfers Overall transfer level: Needs assistance Equipment used: Rolling walker (2 wheeled) Transfers: Sit to/from Stand Sit to Stand: Supervision Stand pivot transfers: Supervision       General transfer comment: Cues for hand placement and safety.   Ambulation/Gait Ambulation/Gait assistance: Supervision Ambulation Distance (Feet): 160 Feet Assistive device: Rolling walker (2 wheeled) Gait Pattern/deviations: Step-to pattern;Decreased stride length;Trunk flexed   Gait velocity interpretation: <1.8 ft/sec, indicative  of risk for recurrent falls General Gait Details: Pt extremely slow with gait sequencing and required increased time to performed gait and stair training.  Pt stopping intermittently (unclear if due to pain or pain meds), no c/o dizziness.     Stairs Stairs: Yes Stairs assistance: Min assist Stair Management: Step to pattern;Forwards;With walker;No rails Number of Stairs: 2 General stair comments: Cues for sequencing and RW placement required step by step instruction to complete and maintain sequencing.    Wheelchair Mobility    Modified Rankin (Stroke Patients Only)       Balance                                    Cognition Arousal/Alertness: Awake/alert Behavior During Therapy: WFL for tasks assessed/performed Overall Cognitive Status: Within Functional Limits for tasks assessed                      Exercises Total Joint Exercises Ankle Circles/Pumps: AROM;Both;10 reps;Supine Quad Sets: AROM;Right;10 reps;Supine Heel Slides: AAROM;Right;10 reps;Supine Goniometric ROM: 10-50 degrees.  R knee in supine position.      General Comments        Pertinent Vitals/Pain Pain Assessment: 0-10 Pain Score: 6  Pain Location: R knee Pain Descriptors / Indicators: Tightness;Sore Pain Intervention(s): Monitored during session;Repositioned;Ice applied;Patient requesting pain meds-RN notified    Home Living                      Prior Function            PT Goals (current goals can now be found in the care plan section) Acute  Rehab PT Goals Patient Stated Goal: return home Potential to Achieve Goals: Good Progress towards PT goals: Progressing toward goals    Frequency  7X/week    PT Plan Current plan remains appropriate    Co-evaluation             End of Session Equipment Utilized During Treatment: Gait belt Activity Tolerance: Patient tolerated treatment well Patient left: with call bell/phone within reach;in bed     Time:  0826-0911 PT Time Calculation (min) (ACUTE ONLY): 45 min  Charges:  $Gait Training: 23-37 mins $Therapeutic Activity: 8-22 mins                    G Codes:      Stacy [name] 09/09/2015, 9:31 AM Stacy [name], PTA pager [phone]

## 2015-09-09 NOTE — Progress Notes (Signed)
     Subjective: 3 Days Post-Op Procedure(s) (LRB): RIGHT TOTAL KNEE ARTHROPLASTY (Right) Awake,alert and oriented x 4. Tolerating po percocet with good pain relief.Requests a  Wheel chair, generally I recommend walking and using the right leg for ambulation. Wheel chair is not necessary. Ambulated with PT this weekend 60' min guard with transition. Noted that PT would like to work with stairs today. Okay for discharge today After PT. Around noon or 11 AM.  Patient reports pain as moderate.    Objective:   VITALS:  Temp:  [98.5 F (36.9 C)-100.5 F (38.1 C)] 100.4 F (38 C) (08/07 0350) Pulse Rate:  [89-102] 102 (08/07 0350) Resp:  [16] 16 (08/07 0350) BP: (113-137)/(68-71) 113/70 (08/07 0350) SpO2:  [97 %-100 %] 98 % (08/07 0350)  Neurologically intact ABD soft Neurovascular intact Sensation intact distally Intact pulses distally Dorsiflexion/Plantar flexion intact Incision: no drainage Compartment soft dressing changed, dermabond adherent to dressing so steristrips applied. 2x2s and tegaderm.   LABS  Recent Labs  09/07/15 0547 09/08/15 0523 09/09/15 0430  HGB 11.2* 10.6* 9.4*  WBC 11.0* 12.9* 11.7*  PLT 231 216 201   No results for input(s): NA, K, CL, CO2, BUN, CREATININE, GLUCOSE in the last 72 hours. No results for input(s): LABPT, INR in the last 72 hours.   Assessment/Plan: 3 Days Post-Op Procedure(s) (LRB): RIGHT TOTAL KNEE ARTHROPLASTY (Right)  Advance diet Up with therapy D/C IV fluids Discharge home with home health  Encourage incentive spirometry for low grade fever, smoker.  NITKA,JAMES E 09/09/2015, 8:14 AM

## 2015-09-09 NOTE — Progress Notes (Signed)
Provided discharge information and answered all questions.  

## 2015-09-30 ENCOUNTER — Ambulatory Visit: Payer: Medicaid Other | Attending: Specialist | Admitting: Physical Therapy

## 2015-09-30 ENCOUNTER — Encounter: Payer: Self-pay | Admitting: Physical Therapy

## 2015-09-30 DIAGNOSIS — M25661 Stiffness of right knee, not elsewhere classified: Secondary | ICD-10-CM | POA: Insufficient documentation

## 2015-09-30 DIAGNOSIS — R262 Difficulty in walking, not elsewhere classified: Secondary | ICD-10-CM | POA: Diagnosis present

## 2015-09-30 DIAGNOSIS — M25561 Pain in right knee: Secondary | ICD-10-CM | POA: Diagnosis present

## 2015-09-30 DIAGNOSIS — R2241 Localized swelling, mass and lump, right lower limb: Secondary | ICD-10-CM | POA: Insufficient documentation

## 2015-09-30 NOTE — Therapy (Signed)
[facility] [address] [place], [place], [address] Phone: [phone]   Fax:  [phone]  Physical Therapy Evaluation  Patient Details  Name: Stacy [name] MRN: [id number] Date of Birth: [birth year] Referring Provider: Otelia SergeantNitka  Encounter Date: 09/30/2015      PT End of Session - 09/30/15 [id number]    Visit Number 1   Date for PT Re-Evaluation 11/22/15   Authorization Type Medicaid   PT Start Time 0757   PT Stop Time 0840   PT Time Calculation (min) 43 min   Activity Tolerance Patient tolerated treatment well   Behavior During Therapy [facility] for tasks assessed/performed      Past Medical History:  Diagnosis Date  . Arthritis   . Asthma   . COPD (chronic obstructive pulmonary disease) (HCC)   . Diabetes mellitus    Type II  . GERD (gastroesophageal reflux disease)    Pt on Protonix daily  . Gout   . Headache(784.0)    otc meds prn  . Hyperlipidemia   . Hypertension    Pt on lisinopril  . Irritable bowel syndrome 11/19/2010  . Neuropathy (HCC)   . Shortness of breath    occasional - uses breathing tx at home    Past Surgical History:  Procedure Laterality Date  . CHOLECYSTECTOMY    . ENDOMETRIAL ABLATION  10/2010  . HERNIA REPAIR     umbicial hernia  . LUMBAR DISC SURGERY  06/03/2015   L 2  L3 L4 L5   . LUMBAR LAMINECTOMY/DECOMPRESSION MICRODISCECTOMY N/A 06/03/2015   Procedure: Bilateral lateral recess decompression L2-3, L3-4, L4-5;  Surgeon: Kerrin ChampagneJames E Nitka, MD;  Location: [facility] OR;  Service: Orthopedics;  Laterality: N/A;  . svd      x 2  . TOTAL KNEE ARTHROPLASTY Right 09/06/2015   Procedure: RIGHT TOTAL KNEE ARTHROPLASTY;  Surgeon: Kerrin ChampagneJames E Nitka, MD;  Location: [facility] OR;  Service: Orthopedics;  Laterality: Right;  . TUBAL LIGATION    . UPPER GASTROINTESTINAL ENDOSCOPY  04/28/11    There were no vitals filed for this visit.       Subjective Assessment - 09/30/15 0801    Subjective Patient had a right TKR  on 09/06/15.  She reports that she has not been feeling well since the surgery.  She reports that she has a CPM and had some home PT but reports that her ROM was "not good".  She had a lumbar surgery in May where she also had a hard time recovering.   Limitations Standing;Walking;House hold activities   How long can you walk comfortably? She does not walk much due to pain and fear   Patient Stated Goals walk, have normal motion, have no pain   Currently in Pain? Yes   Pain Score 3    Pain Location Knee   Pain Orientation Right   Pain Descriptors / Indicators Aching   Pain Type Surgical pain   Pain Frequency Constant   Aggravating Factors  walking, standing and bending pain will go up to 9/10   Pain Relieving Factors rest, reports that the pain meds make her sick to her stomach, at best the pain is a 3/10   Effect of Pain on Daily Activities difficulty with all ADL's            [facility] Assessment - 09/30/15 0001      Assessment   Medical Diagnosis s/p right TKR   Referring Provider Nitka   Onset Date/Surgical  Date 09/06/15   Prior Therapy at home for the knee     Balance Screen   Has the patient fallen in the past 6 months Yes   How many times? 6   Has the patient had a decrease in activity level because of a fear of falling?  Yes   Is the patient reluctant to leave their home because of a fear of falling?  Yes     Home Environment   Additional Comments has some stairs at home     Prior Function   Level of Independence Independent   Vocation On disability   Leisure no exercise     Observation/Other Assessments-Edema    Edema --  edema around the right knee/patella     ROM / Strength   AROM / PROM / Strength PROM     AROM   Overall AROM Comments AROM of the right knee in stiting 33-60 degrees flexion with pain     PROM   Overall PROM Comments PROM of the right knee 26-64 degrees flexion with pain     Strength   Overall Strength Comments 2/5 with pain      Palpation   Palpation comment tender in the right knee, has an ACE wrap on the knee     Ambulation/Gait   Gait Comments uses a FWW, very slow, very stiff right knee, antalgic on the right, step to gait     Timed Up and Go Test   Normal TUG (seconds) 63                   [facility] Adult PT Treatment/Exercise - 09/30/15 0001      Exercises   Exercises Knee/Hip     Knee/Hip Exercises: Aerobic   Nustep level 1 x 6 minutes just to get her moving, she is so garuded with any motions                PT Education - 09/30/15 0821    Education provided Yes   Education Details Quad sets, SAQ, LLLD flexion and extension stretches          PT Short Term Goals - 09/30/15 0836      PT SHORT TERM GOAL #1   Title I with initial HEP   Time 2   Period Weeks   Status New           PT Long Term Goals - 09/30/15 1610      PT LONG TERM GOAL #1   Title I with advanced HEP   Time 8   Period Weeks   Status New     PT LONG TERM GOAL #2   Title increase AROM of the righ tknee to 10-100 degrees flexion   Time 8   Period Weeks   Status New     PT LONG TERM GOAL #3   Title decreased pain 50% with ADLs   Time 8   Period Weeks   Status New     PT LONG TERM GOAL #4   Title walk with cane for community distances   Time 8   Period Weeks   Status New               Plan - 09/30/15 9604    Clinical Impression Statement Patient with a right TKR on 09/06/15, she has very poor ROM of 34-60 degrees flexion.  She had a lumbar surgery in May.  She reports feeling sick at her stomach since the  surgery, she reports that she has stopped medication.  She uses a FWW or W/C for locomotion.  When walking she uses a step to gait, very stiff leg, her TUG time was 63 seconds.   Rehab Potential Good   Clinical Impairments Affecting Rehab Potential Medicaid will limit her visits, which will make recovery difficult   PT Frequency 2x / week   PT Duration 8 weeks   PT  Treatment/Interventions ADLs/Self Care Home Management;Electrical Stimulation;Therapeutic exercise;Balance training;Neuromuscular re-education;Patient/family education;Gait training;Manual techniques;Passive range of motion;Stair training;Functional mobility training;Cryotherapy;Therapeutic activities;Vasopneumatic Device   PT Next Visit Plan We will have to see if Medicaid will allow visits, she was having home PT, her ROM is very poor.   Consulted and Agree with Plan of Care Patient      Patient will benefit from skilled therapeutic intervention in order to improve the following deficits and impairments:  Abnormal gait, Cardiopulmonary status limiting activity, Decreased activity tolerance, Decreased mobility, Decreased range of motion, Decreased strength, Difficulty walking, Increased muscle spasms, Impaired flexibility, Improper body mechanics, Postural dysfunction, Pain, Increased edema, Decreased scar mobility  Visit Diagnosis: Pain in right knee - Plan: PT plan of care cert/re-cert  Stiffness of right knee, not elsewhere classified - Plan: PT plan of care cert/re-cert  Difficulty in walking, not elsewhere classified - Plan: PT plan of care cert/re-cert  Localized swelling, mass and lump, right lower limb - Plan: PT plan of care cert/re-cert     Problem List Patient Active Problem List   Diagnosis Date Noted  . Osteoarthritis of right knee 09/06/2015  . Type 2 diabetes mellitus without complication, without long-term current use of insulin (HCC) 06/13/2015  . Neurogenic claudication due to lumbar spinal stenosis 06/03/2015    Class: Chronic  . Chondromalacia of both patellae 06/03/2015    Class: Chronic  . Spinal stenosis, lumbar region, with neurogenic claudication 06/03/2015  . Tobacco use disorder 04/25/2015  . DJD (degenerative joint disease) of knee 01/04/2015  . Falls frequently 11/29/2014  . Hemorrhoid 11/14/2014  . Bilateral knee pain 07/31/2014  . Asthma, chronic  07/19/2014  . Gout of big toe 07/19/2014  . Midline low back pain with right-sided sciatica 01/01/2014  . Bilateral low back pain without sciatica 08/14/2013  . Essential hypertension 08/14/2013  . Gastroesophageal reflux disease without esophagitis 08/14/2013  . COPD exacerbation (HCC) 08/14/2013  . Colon cancer screening 08/14/2013  . Low back pain 05/15/2013  . Preventative health care 05/15/2013  . GERD (gastroesophageal reflux disease) 02/16/2013  . DM (diabetes mellitus) (HCC) 02/16/2013  . COPD (chronic obstructive pulmonary disease) (HCC) 04/17/2011  . Hypertension 04/17/2011  . Cough 04/16/2011    Jearld Lesch., PT 09/30/2015, 8:39 AM  [facility] [address] [place], [place], [address] Phone: [phone]   Fax:  [phone]  Name: Quintara Bost MRN: [id number] Date of Birth: [birth year]

## 2015-10-09 ENCOUNTER — Ambulatory Visit: Payer: Medicaid Other | Attending: Specialist | Admitting: Physical Therapy

## 2015-10-09 ENCOUNTER — Encounter: Payer: Self-pay | Admitting: Physical Therapy

## 2015-10-09 DIAGNOSIS — M25561 Pain in right knee: Secondary | ICD-10-CM | POA: Insufficient documentation

## 2015-10-09 DIAGNOSIS — R262 Difficulty in walking, not elsewhere classified: Secondary | ICD-10-CM | POA: Insufficient documentation

## 2015-10-09 DIAGNOSIS — M25661 Stiffness of right knee, not elsewhere classified: Secondary | ICD-10-CM | POA: Insufficient documentation

## 2015-10-09 DIAGNOSIS — R2241 Localized swelling, mass and lump, right lower limb: Secondary | ICD-10-CM | POA: Insufficient documentation

## 2015-10-09 DIAGNOSIS — M6281 Muscle weakness (generalized): Secondary | ICD-10-CM | POA: Insufficient documentation

## 2015-10-09 NOTE — Therapy (Signed)
[facility] [address] [place], [place], [address] Phone: [phone]   Fax:  [phone]  Physical Therapy Treatment  Patient Details  Name: Stacy [name] MRN: [id number] Date of Birth: 27-May-1960 Referring Provider: Otelia [name]  Encounter Date: 10/09/2015      PT End of Session - 10/09/15 0913    Visit Number 2   Date for PT Re-Evaluation 11/22/15   Authorization Type Medicaid   PT Start Time 0747   PT Stop Time 0842   PT Time Calculation (min) 55 min   Activity Tolerance Patient tolerated treatment well   Behavior During Therapy [facility] for tasks assessed/performed      Past Medical History:  Diagnosis Date  . Arthritis   . Asthma   . COPD (chronic obstructive pulmonary disease) (HCC)   . Diabetes mellitus    Type II  . GERD (gastroesophageal reflux disease)    Pt on Protonix daily  . Gout   . Headache(784.0)    otc meds prn  . Hyperlipidemia   . Hypertension    Pt on lisinopril  . Irritable bowel syndrome 11/19/2010  . Neuropathy (HCC)   . Shortness of breath    occasional - uses breathing tx at home    Past Surgical History:  Procedure Laterality Date  . CHOLECYSTECTOMY    . ENDOMETRIAL ABLATION  10/2010  . HERNIA REPAIR     umbicial hernia  . LUMBAR DISC SURGERY  06/03/2015   L 2  L3 L4 L5   . LUMBAR LAMINECTOMY/DECOMPRESSION MICRODISCECTOMY N/A 06/03/2015   Procedure: Bilateral lateral recess decompression L2-3, L3-4, L4-5;  Surgeon: Stacy ChampagneJames E Nitka, MD;  Location: [facility];  Service: Orthopedics;  Laterality: N/A;  . svd      x 2  . TOTAL KNEE ARTHROPLASTY Right 09/06/2015   Procedure: RIGHT TOTAL KNEE ARTHROPLASTY;  Surgeon: Stacy ChampagneJames E Nitka, MD;  Location: [facility] OR;  Service: Orthopedics;  Laterality: Right;  . TUBAL LIGATION    . UPPER GASTROINTESTINAL ENDOSCOPY  04/28/11    There were no vitals filed for this visit.      Subjective Assessment - 10/09/15 0757    Subjective I am trying to walk more.   I am really stiff   Currently in Pain? Yes   Pain Score 2    Pain Location Knee            [facility] PT Assessment - 10/09/15 0001      AROM   Overall AROM Comments AROM of the right knee 29-70 degrees flexion                     [facility] Adult PT Treatment/Exercise - 10/09/15 0001      Ambulation/Gait   Gait Comments gait with FWW, cues to bear weight on the right, then worked with [facility], cues for sequencing and step length     Knee/Hip Exercises: Stretches   Other Knee/Hip Stretches low load long duration stretches for knee extension with 6# x 2 minutes 2 reps   Other Knee/Hip Stretches low load long duration 3# weight with leg hanging in flexion at edge of bed     Knee/Hip Exercises: Aerobic   Nustep level 4 x 6 minutes     Knee/Hip Exercises: Machines for Strengthening   Cybex Knee Extension 5# with PT assist for the right leg 3x10   Cybex Knee Flexion 20# 3x10   Cybex Leg Press 20# 3x10, right leg no weight  2x10     Knee/Hip Exercises: Supine   Short Arc Quad Sets 3 sets;10 reps   Short Arc Quad Sets Limitations 3#   Other Supine Knee/Hip Exercises feet on ball K2C and bridges                  PT Short Term Goals - 10/09/15 0920      PT SHORT TERM GOAL #1   Title I with initial HEP   Status Achieved           PT Long Term Goals - 09/30/15 0836      PT LONG TERM GOAL #1   Title I with advanced HEP   Time 8   Period Weeks   Status New     PT LONG TERM GOAL #2   Title increase AROM of the righ tknee to 10-100 degrees flexion   Time 8   Period Weeks   Status New     PT LONG TERM GOAL #3   Title decreased pain 50% with ADLs   Time 8   Period Weeks   Status New     PT LONG TERM GOAL #4   Title walk with cane for community distances   Time 8   Period Weeks   Status New               Plan - 10/09/15 0914    Clinical Impression Statement Patient very stiff and seems to be stuck in the surgery mode that she just had  surgery, she is timid to try any exercises.  She does not report much pain, she did have her non surgical knee give out while walking with cane, but was able to catch herself   PT Next Visit Plan Medicaid will allow 2 more visits   Consulted and Agree with Plan of Care Patient      Patient will benefit from skilled therapeutic intervention in order to improve the following deficits and impairments:  Abnormal gait, Cardiopulmonary status limiting activity, Decreased activity tolerance, Decreased mobility, Decreased range of motion, Decreased strength, Difficulty walking, Increased muscle spasms, Impaired flexibility, Improper body mechanics, Postural dysfunction, Pain, Increased edema, Decreased scar mobility  Visit Diagnosis: Pain in right knee  Stiffness of right knee, not elsewhere classified  Difficulty in walking, not elsewhere classified     Problem List Patient Active Problem List   Diagnosis Date Noted  . Osteoarthritis of right knee 09/06/2015  . Type 2 diabetes mellitus without complication, without long-term current use of insulin (HCC) 06/13/2015  . Neurogenic claudication due to lumbar spinal stenosis 06/03/2015    Class: Chronic  . Chondromalacia of both patellae 06/03/2015    Class: Chronic  . Spinal stenosis, lumbar region, with neurogenic claudication 06/03/2015  . Tobacco use disorder 04/25/2015  . DJD (degenerative joint disease) of knee 01/04/2015  . Falls frequently 11/29/2014  . Hemorrhoid 11/14/2014  . Bilateral knee pain 07/31/2014  . Asthma, chronic 07/19/2014  . Gout of big toe 07/19/2014  . Midline low back pain with right-sided sciatica 01/01/2014  . Bilateral low back pain without sciatica 08/14/2013  . Essential hypertension 08/14/2013  . Gastroesophageal reflux disease without esophagitis 08/14/2013  . COPD exacerbation (HCC) 08/14/2013  . Colon cancer screening 08/14/2013  . Low back pain 05/15/2013  . Preventative health care 05/15/2013  .  GERD (gastroesophageal reflux disease) 02/16/2013  . DM (diabetes mellitus) (HCC) 02/16/2013  . COPD (chronic obstructive pulmonary disease) (HCC) 04/17/2011  . Hypertension 04/17/2011  .  Cough 04/16/2011    Jearld Lesch., PT 10/09/2015, 9:21 AM  [facility] [address] [place], [place], [address] Phone: [phone]   Fax:  [phone]  Name: Stacy [name] MRN: [id number] Date of Birth: 07-Oct-1960

## 2015-10-16 ENCOUNTER — Encounter: Payer: Self-pay | Admitting: Physical Therapy

## 2015-10-16 ENCOUNTER — Ambulatory Visit: Payer: Medicaid Other | Admitting: Physical Therapy

## 2015-10-16 DIAGNOSIS — M25561 Pain in right knee: Secondary | ICD-10-CM

## 2015-10-16 DIAGNOSIS — M25661 Stiffness of right knee, not elsewhere classified: Secondary | ICD-10-CM

## 2015-10-16 DIAGNOSIS — R262 Difficulty in walking, not elsewhere classified: Secondary | ICD-10-CM

## 2015-10-16 NOTE — Therapy (Signed)
[place], [place], [address] Phone: [phone]   Fax:  [phone]  Physical Therapy Treatment  Patient Details  Name: Stacy [name] MRN: [id number] Date of Birth: September 06, 1960 Referring Provider: [name]  Encounter Date: 10/16/2015      PT End of Session - 10/16/15 1016    Visit Number 3   Number of Visits 4   Date for PT Re-Evaluation 11/22/15   Authorization Type Medicaid   PT Start Time [phone]   PT Stop Time 0930   PT Time Calculation (min) 55 min   Activity Tolerance Patient limited by pain   Behavior During Therapy [facility] for tasks assessed/performed      Past Medical History:  Diagnosis Date  . Arthritis   . Asthma   . COPD (chronic obstructive pulmonary disease) ([place])   . Diabetes mellitus    Type II  . GERD (gastroesophageal reflux disease)    Pt on Protonix daily  . Gout   . Headache(784.0)    otc meds prn  . Hyperlipidemia   . Hypertension    Pt on lisinopril  . Irritable bowel syndrome 11/19/2010  . Neuropathy ([place])   . Shortness of breath    occasional - uses breathing tx at home    Past Surgical History:  Procedure Laterality Date  . CHOLECYSTECTOMY    . ENDOMETRIAL ABLATION  10/2010  . HERNIA REPAIR     umbicial hernia  . LUMBAR DISC SURGERY  06/03/2015   L 2  L3 L4 L5   . LUMBAR LAMINECTOMY/DECOMPRESSION MICRODISCECTOMY N/A 06/03/2015   Procedure: Bilateral lateral recess decompression L2-3, L3-4, L4-5;  Surgeon: [name], MD;  Location: [place];  Service: Orthopedics;  Laterality: N/A;  . svd      x 2  . TOTAL KNEE ARTHROPLASTY Right 09/06/2015   Procedure: RIGHT TOTAL KNEE ARTHROPLASTY;  Surgeon: [name], MD;  Location: [place];  Service: Orthopedics;  Laterality: Right;  . TUBAL LIGATION    . UPPER GASTROINTESTINAL ENDOSCOPY  04/28/11    There were no vitals filed for this visit.      Subjective Assessment - 10/16/15 0850    Subjective Patient  reports that she is trying exercises at home, she did walk in today, she had been using the w/c to get into our clinic.   Currently in Pain? No/denies            [facility] PT Assessment - 10/16/15 0001      AROM   Overall AROM Comments AROM of the right knee 25-76 degrees flexion                     [facility] Adult PT Treatment/Exercise - 10/16/15 0001      Ambulation/Gait   Gait Comments gait with rollator, 2x120 feet cues for step lenght     Knee/Hip Exercises: Stretches   Other Knee/Hip Stretches low load long duration stretches for knee extension with 6# x 2 minutes 2 reps   Other Knee/Hip Stretches low load long duration 3# weight with leg hanging in flexion at edge of bed     Knee/Hip Exercises: Aerobic   Nustep level 4 x 68mnutes, with cues to bend knee and decrease the hip hike     Knee/Hip Exercises: Machines for Strengthening   Cybex Knee Extension 5# with some assist   Cybex Knee Flexion 20# 3x10, 10# right leg only   Cybex Leg Press  20# 2x10, no weight both legs working on flexion, then right only without weight     Knee/Hip Exercises: Supine   Short Arc Quad Sets 3 sets;10 reps   Short Arc Quad Sets Limitations 3#     Manual Therapy   Manual Therapy Joint mobilization;Passive ROM   Joint Mobilization right knee to gain flexion and extension   Passive ROM PROM for flexion and extension                  PT Short Term Goals - 10/09/15 0920      PT SHORT TERM GOAL #1   Title I with initial HEP   Status Achieved           PT Long Term Goals - 10/16/15 1021      PT LONG TERM GOAL #1   Title I with advanced HEP   Status On-going     PT LONG TERM GOAL #2   Title increase AROM of the righ tknee to 10-100 degrees flexion   Status On-going     PT LONG TERM GOAL #3   Title decreased pain 50% with ADLs   Status Partially Met     PT LONG TERM GOAL #4   Title walk with cane for community distances   Status On-going                Plan - 10/16/15 1017    Clinical Impression Statement Patient making small gains with ROM, she does report trying exercises at home, but reports that it hurts and she does not do it much.  She is walking today where as previously she was using a w/c   PT Next Visit Plan 1 more visit per medicaid   Consulted and Agree with Plan of Care Patient      Patient will benefit from skilled therapeutic intervention in order to improve the following deficits and impairments:  Abnormal gait, Cardiopulmonary status limiting activity, Decreased activity tolerance, Decreased mobility, Decreased range of motion, Decreased strength, Difficulty walking, Increased muscle spasms, Impaired flexibility, Improper body mechanics, Postural dysfunction, Pain, Increased edema, Decreased scar mobility  Visit Diagnosis: Stiffness of right knee, not elsewhere classified  Pain in right knee  Difficulty in walking, not elsewhere classified     Problem List Patient Active Problem List   Diagnosis Date Noted  . Osteoarthritis of right knee 09/06/2015  . Type 2 diabetes mellitus without complication, without long-term current use of insulin ([place]) 06/13/2015  . Neurogenic claudication due to lumbar spinal stenosis 06/03/2015    Class: Chronic  . Chondromalacia of both patellae 06/03/2015    Class: Chronic  . Spinal stenosis, lumbar region, with neurogenic claudication 06/03/2015  . Tobacco use disorder 04/25/2015  . DJD (degenerative joint disease) of knee 01/04/2015  . Falls frequently 11/29/2014  . Hemorrhoid 11/14/2014  . Bilateral knee pain 07/31/2014  . Asthma, chronic 07/19/2014  . Gout of big toe 07/19/2014  . Midline low back pain with right-sided sciatica 01/01/2014  . Bilateral low back pain without sciatica 08/14/2013  . Essential hypertension 08/14/2013  . Gastroesophageal reflux disease without esophagitis 08/14/2013  . COPD exacerbation ([place]) 08/14/2013  . Colon cancer screening 08/14/2013   . Low back pain 05/15/2013  . Preventative health care 05/15/2013  . GERD (gastroesophageal reflux disease) 02/16/2013  . DM (diabetes mellitus) ([place]) 02/16/2013  . COPD (chronic obstructive pulmonary disease) ([place]) 04/17/2011  . Hypertension 04/17/2011  . Cough 04/16/2011    Takerra Lupinacci W., PT 10/16/2015, 10:22  [place], [place], [address] Phone: [phone]   Fax:  [phone]  Name: Stacy [name] MRN: [id number] Date of Birth: 1960-12-22

## 2015-10-23 ENCOUNTER — Ambulatory Visit: Payer: Medicaid Other | Admitting: Physical Therapy

## 2015-10-23 ENCOUNTER — Encounter: Payer: Self-pay | Admitting: Physical Therapy

## 2015-10-23 DIAGNOSIS — R2241 Localized swelling, mass and lump, right lower limb: Secondary | ICD-10-CM

## 2015-10-23 DIAGNOSIS — M25561 Pain in right knee: Secondary | ICD-10-CM | POA: Diagnosis not present

## 2015-10-23 DIAGNOSIS — R262 Difficulty in walking, not elsewhere classified: Secondary | ICD-10-CM

## 2015-10-23 DIAGNOSIS — M25661 Stiffness of right knee, not elsewhere classified: Secondary | ICD-10-CM

## 2015-10-23 NOTE — Therapy (Signed)
[place], [place], [address] Phone: [phone]   Fax:  [phone]  Physical Therapy Treatment  Patient Details  Name: Stacy [name] MRN: [id number] Date of Birth: [birth year] Referring Provider: [name]  Encounter Date: 10/23/2015      PT End of Session - 10/23/15 1009    Visit Number 4   Number of Visits 4   Date for PT Re-Evaluation 11/22/15   Authorization Type Medicaid   PT Start Time 0920   PT Stop Time 1024   PT Time Calculation (min) 64 min   Activity Tolerance Patient limited by pain   Behavior During Therapy [facility] for tasks assessed/performed      Past Medical History:  Diagnosis Date  . Arthritis   . Asthma   . COPD (chronic obstructive pulmonary disease) ([place])   . Diabetes mellitus    Type II  . GERD (gastroesophageal reflux disease)    Pt on Protonix daily  . Gout   . Headache(784.0)    otc meds prn  . Hyperlipidemia   . Hypertension    Pt on lisinopril  . Irritable bowel syndrome 11/19/2010  . Neuropathy ([place])   . Shortness of breath    occasional - uses breathing tx at home    Past Surgical History:  Procedure Laterality Date  . CHOLECYSTECTOMY    . ENDOMETRIAL ABLATION  10/2010  . HERNIA REPAIR     umbicial hernia  . LUMBAR DISC SURGERY  06/03/2015   L 2  L3 L4 L5   . LUMBAR LAMINECTOMY/DECOMPRESSION MICRODISCECTOMY N/A 06/03/2015   Procedure: Bilateral lateral recess decompression L2-3, L3-4, L4-5;  Surgeon: [name], MD;  Location: [place];  Service: Orthopedics;  Laterality: N/A;  . svd      x 2  . TOTAL KNEE ARTHROPLASTY Right 09/06/2015   Procedure: RIGHT TOTAL KNEE ARTHROPLASTY;  Surgeon: [name], MD;  Location: [place];  Service: Orthopedics;  Laterality: Right;  . TUBAL LIGATION    . UPPER GASTROINTESTINAL ENDOSCOPY  04/28/11    There were no vitals filed for this visit.      Subjective Assessment - 10/23/15 0934    Subjective Patient  reports that she is walking more.  She reports that it still having the left knee give out at times.   Currently in Pain? Yes   Pain Score 3    Pain Location Knee   Pain Orientation Right   Pain Descriptors / Indicators Aching   Aggravating Factors  walking and standing and bending pain up to 8/10   Pain Relieving Factors rest and pain meds            [facility] PT Assessment - 10/23/15 0001      AROM   Overall AROM Comments AROM 28-79 degrees flexion     PROM   Overall PROM Comments PROM 20 - 82 degrees flexion at the end of treatment today with max force I was aboe to get her to 90 degrees flexion                     [facility] Adult PT Treatment/Exercise - 10/23/15 0001      Ambulation/Gait   Gait Comments gait with SPC and with rollator 2x150 feet working on heel to toe gait and step length     Knee/Hip Exercises: Stretches   Other Knee/Hip Stretches low load long duration stretches for knee extension with 6# x 2  minutes 2 reps   Other Knee/Hip Stretches low load long duration 3# weight with leg hanging in flexion at edge of bed     Knee/Hip Exercises: Aerobic   Nustep level 4 x 47mnutes, with cues to bend knee and decrease the hip hike     Knee/Hip Exercises: Machines for Strengthening   Cybex Knee Extension 5# with some assist   Cybex Knee Flexion 20# 3x10, 10# right leg only   Cybex Leg Press 20# 2x10, no weight both legs working on flexion, then right only without weight     Manual Therapy   Manual Therapy Joint mobilization;Passive ROM   Joint Mobilization right knee to gain flexion and extension   Passive ROM PROM for flexion and extension, some contract relax methods                  PT Short Term Goals - 10/09/15 0920      PT SHORT TERM GOAL #1   Title I with initial HEP   Status Achieved           PT Long Term Goals - 10/23/15 1011      PT LONG TERM GOAL #1   Title I with advanced HEP   Status Partially Met     PT LONG TERM  GOAL #2   Title increase AROM of the righ tknee to 10-100 degrees flexion   Status Not Met     PT LONG TERM GOAL #3   Title decreased pain 50% with ADLs   Status Partially Met     PT LONG TERM GOAL #4   Title walk with cane for community distances   Status Partially Met               Plan - 10/23/15 1009    Clinical Impression Statement Patient very limited with visits due to Medicaid.  She came in at the beginning not feeling well and reported that she could not eat, she has remained with an ace bandage on the knee per her choice.  She reports walking at home, but not stretching.  Her ROM is very poor and the lack of extension causes poor gait.     Clinical Impairments Affecting Rehab Potential Medicaid will limit her visits, which will make recovery difficult   PT Next Visit Plan Medicaid coverage ends today and she is doing poorly with ROM, she is walking much better than two weeks ago.   Consulted and Agree with Plan of Care Patient      Patient will benefit from skilled therapeutic intervention in order to improve the following deficits and impairments:  Abnormal gait, Cardiopulmonary status limiting activity, Decreased activity tolerance, Decreased mobility, Decreased range of motion, Decreased strength, Difficulty walking, Increased muscle spasms, Impaired flexibility, Improper body mechanics, Postural dysfunction, Pain, Increased edema, Decreased scar mobility  Visit Diagnosis: Stiffness of right knee, not elsewhere classified  Pain in right knee  Difficulty in walking, not elsewhere classified  Localized swelling, mass and lump, right lower limb     Problem List Patient Active Problem List   Diagnosis Date Noted  . Osteoarthritis of right knee 09/06/2015  . Type 2 diabetes mellitus without complication, without long-term current use of insulin ([place] 06/13/2015  . Neurogenic claudication due to lumbar spinal stenosis 06/03/2015    Class: Chronic  .  Chondromalacia of both patellae 06/03/2015    Class: Chronic  . Spinal stenosis, lumbar region, with neurogenic claudication 06/03/2015  . Tobacco use disorder 04/25/2015  .  DJD (degenerative joint disease) of knee 01/04/2015  . Falls frequently 11/29/2014  . Hemorrhoid 11/14/2014  . Bilateral knee pain 07/31/2014  . Asthma, chronic 07/19/2014  . Gout of big toe 07/19/2014  . Midline low back pain with right-sided sciatica 01/01/2014  . Bilateral low back pain without sciatica 08/14/2013  . Essential hypertension 08/14/2013  . Gastroesophageal reflux disease without esophagitis 08/14/2013  . COPD exacerbation ([place]) 08/14/2013  . Colon cancer screening 08/14/2013  . Low back pain 05/15/2013  . Preventative health care 05/15/2013  . GERD (gastroesophageal reflux disease) 02/16/2013  . DM (diabetes mellitus) ([place]) 02/16/2013  . COPD (chronic obstructive pulmonary disease) ([place]) 04/17/2011  . Hypertension 04/17/2011  . Cough 04/16/2011    Sumner Boast., PT 10/23/2015, 10:12 AM  [facility] [place] [address] [place] [address] [place], [place], [address] Phone: [phone]   Fax:  [phone]  Name: Stacy [name] MRN: [id number] Date of Birth: 05-Mar-1960

## 2015-10-24 ENCOUNTER — Ambulatory Visit: Payer: Medicaid Other | Admitting: Internal Medicine

## 2015-10-24 ENCOUNTER — Other Ambulatory Visit (HOSPITAL_COMMUNITY)
Admission: RE | Admit: 2015-10-24 | Discharge: 2015-10-24 | Disposition: A | Discharge status: Home / Self Care | Payer: Medicaid Other | Source: Ambulatory Visit | Attending: Internal Medicine | Admitting: Internal Medicine

## 2015-10-24 ENCOUNTER — Encounter: Payer: Self-pay | Admitting: Internal Medicine

## 2015-10-24 ENCOUNTER — Ambulatory Visit: Payer: Medicaid Other | Attending: Internal Medicine | Admitting: Internal Medicine

## 2015-10-24 VITALS — BP 138/88 | HR 72 | Temp 98.8°F | Resp 18 | Ht 66.0 in | Wt 148.8 lb

## 2015-10-24 DIAGNOSIS — I10 Essential (primary) hypertension: Secondary | ICD-10-CM | POA: Diagnosis not present

## 2015-10-24 DIAGNOSIS — Z720 Tobacco use: Secondary | ICD-10-CM | POA: Diagnosis not present

## 2015-10-24 DIAGNOSIS — J449 Chronic obstructive pulmonary disease, unspecified: Secondary | ICD-10-CM | POA: Diagnosis not present

## 2015-10-24 DIAGNOSIS — M545 Low back pain, unspecified: Secondary | ICD-10-CM

## 2015-10-24 DIAGNOSIS — E119 Type 2 diabetes mellitus without complications: Secondary | ICD-10-CM

## 2015-10-24 DIAGNOSIS — Z113 Encounter for screening for infections with a predominantly sexual mode of transmission: Secondary | ICD-10-CM | POA: Diagnosis present

## 2015-10-24 DIAGNOSIS — E785 Hyperlipidemia, unspecified: Secondary | ICD-10-CM | POA: Diagnosis not present

## 2015-10-24 DIAGNOSIS — Z23 Encounter for immunization: Secondary | ICD-10-CM | POA: Insufficient documentation

## 2015-10-24 DIAGNOSIS — Z79899 Other long term (current) drug therapy: Secondary | ICD-10-CM | POA: Insufficient documentation

## 2015-10-24 DIAGNOSIS — M109 Gout, unspecified: Secondary | ICD-10-CM | POA: Insufficient documentation

## 2015-10-24 DIAGNOSIS — F17200 Nicotine dependence, unspecified, uncomplicated: Secondary | ICD-10-CM

## 2015-10-24 DIAGNOSIS — M170 Bilateral primary osteoarthritis of knee: Secondary | ICD-10-CM | POA: Diagnosis not present

## 2015-10-24 DIAGNOSIS — Z0000 Encounter for general adult medical examination without abnormal findings: Secondary | ICD-10-CM

## 2015-10-24 DIAGNOSIS — Z124 Encounter for screening for malignant neoplasm of cervix: Secondary | ICD-10-CM

## 2015-10-24 DIAGNOSIS — Z01419 Encounter for gynecological examination (general) (routine) without abnormal findings: Secondary | ICD-10-CM | POA: Insufficient documentation

## 2015-10-24 DIAGNOSIS — Z1272 Encounter for screening for malignant neoplasm of vagina: Secondary | ICD-10-CM | POA: Diagnosis not present

## 2015-10-24 DIAGNOSIS — N760 Acute vaginitis: Secondary | ICD-10-CM | POA: Diagnosis present

## 2015-10-24 DIAGNOSIS — Z7984 Long term (current) use of oral hypoglycemic drugs: Secondary | ICD-10-CM | POA: Insufficient documentation

## 2015-10-24 DIAGNOSIS — J441 Chronic obstructive pulmonary disease with (acute) exacerbation: Secondary | ICD-10-CM

## 2015-10-24 DIAGNOSIS — K219 Gastro-esophageal reflux disease without esophagitis: Secondary | ICD-10-CM | POA: Diagnosis not present

## 2015-10-24 MED ORDER — MOMETASONE FURO-FORMOTEROL FUM 100-5 MCG/ACT IN AERO: 2.0000 | INHALATION_SPRAY | Freq: Two times a day (BID) | RESPIRATORY_TRACT | 3 refills | Status: DC

## 2015-10-24 MED ORDER — GABAPENTIN 300 MG PO CAPS: 300.0000 mg | ORAL_CAPSULE | Freq: Three times a day (TID) | ORAL | 3 refills | Status: DC

## 2015-10-24 MED ORDER — HYDROCHLOROTHIAZIDE 25 MG PO TABS: 25.0000 mg | ORAL_TABLET | Freq: Every day | ORAL | 3 refills | Status: DC

## 2015-10-24 MED ORDER — ACETAMINOPHEN-CODEINE #3 300-30 MG PO TABS: 1.0000 | ORAL_TABLET | ORAL | 0 refills | Status: DC | PRN

## 2015-10-24 MED ORDER — METFORMIN HCL ER 750 MG PO TB24: 750.0000 mg | ORAL_TABLET | Freq: Every day | ORAL | 3 refills | Status: DC

## 2015-10-24 MED ORDER — PANTOPRAZOLE SODIUM 40 MG PO TBEC: 40.0000 mg | DELAYED_RELEASE_TABLET | Freq: Every day | ORAL | 3 refills | Status: DC

## 2015-10-24 MED ORDER — METHOCARBAMOL 500 MG PO TABS: 500.0000 mg | ORAL_TABLET | Freq: Four times a day (QID) | ORAL | 3 refills | Status: DC | PRN

## 2015-10-24 MED ORDER — POTASSIUM CHLORIDE CRYS ER 20 MEQ PO TBCR: 20.0000 meq | EXTENDED_RELEASE_TABLET | Freq: Every day | ORAL | 3 refills | Status: DC

## 2015-10-24 MED ORDER — SIMVASTATIN 20 MG PO TABS: 20.0000 mg | ORAL_TABLET | Freq: Every day | ORAL | 3 refills | Status: DC

## 2015-10-24 NOTE — Progress Notes (Signed)
Stacy [name], is a 55 y.o. female  ZOX:[id number]  [id number]  DOB - 09-11-1960  Chief Complaint  Patient presents with  . Gynecologic Exam      Subjective:   Stacy [name] is a 55 y.o. female with history of hypertension, GERD, gouty arthritis, bilateral primary osteoarthritis of the knees, type 2 diabetes mellitus controlled, hyperlipidemia, COPD with ongoing tobacco use and chronic back pain here today for a follow up visit and pap smear. She had a PT session this morning following her knee replacement surgery, still in pain from PT, otherwise doing ok. No new complaints, compliant with her medications, reports no side effect. She continues to smoke cigarette. Patient has No headache, No chest pain, No abdominal pain - No Nausea, No new weakness tingling or numbness, No Cough - SOB. She needs refill on her medications.  Problem  Pap Smear for Cervical Cancer Screening  Healthcare Maintenance    ALLERGIES: Allergies  Allergen Reactions  . Other Shortness Of Breath    UNSPECIFIED AGENTS Allergic to perfumes and cleaning products  . Ace Inhibitors Other (See Comments) and Cough       . Aspirin Nausea Only    unknown    PAST MEDICAL HISTORY: Past Medical History:  Diagnosis Date  . Arthritis   . Asthma   . COPD (chronic obstructive pulmonary disease) (HCC)   . Diabetes mellitus    Type II  . GERD (gastroesophageal reflux disease)    Pt on Protonix daily  . Gout   . Headache(784.0)    otc meds prn  . Hyperlipidemia   . Hypertension    Pt on lisinopril  . Irritable bowel syndrome 11/19/2010  . Neuropathy (HCC)   . Shortness of breath    occasional - uses breathing tx at home    MEDICATIONS AT HOME: Prior to Admission medications   Medication Sig Start Date End Date Taking? Authorizing Provider  acetaminophen-codeine (TYLENOL #3) 300-30 MG tablet Take 1 tablet by mouth every 4 (four) hours as needed. 10/24/15  Yes Quentin Angst, MD  albuterol  (PROVENTIL) (2.5 MG/3ML) 0.083% nebulizer solution Take 3 mLs (2.5 mg total) by nebulization every 4 (four) hours as needed for wheezing. 04/25/15  Yes Quentin Angst, MD  allopurinol (ZYLOPRIM) 100 MG tablet Take 1 tablet (100 mg total) by mouth daily. 05/30/15  Yes Quentin Angst, MD  Blood Glucose Monitoring Suppl (TRUE METRIX METER) DEVI 1 Device by Does not apply route 4 (four) times daily -  before meals and at bedtime. 11/14/14  Yes Jaclyn Shaggy, MD  colchicine 0.6 MG tablet Take 1 tablet (0.6 mg total) by mouth daily. 05/30/15  Yes Quentin Angst, MD  docusate sodium (COLACE) 100 MG capsule Take 1 capsule (100 mg total) by mouth 2 (two) times daily. 09/09/15  Yes Kerrin Champagne, MD  EPINEPHrine 0.3 mg/0.3 mL IJ SOAJ injection Inject 0.3 mLs (0.3 mg total) into the muscle once. 09/10/14  Yes Quentin Angst, MD  gabapentin (NEURONTIN) 300 MG capsule Take 1 capsule (300 mg total) by mouth 3 (three) times daily. 10/24/15  Yes Quentin Angst, MD  glucose blood (ACCU-CHEK AVIVA) test strip Use as instructed 02/22/15  Yes Delyla Sandeen E Ruthmary Occhipinti, MD  glucose blood (TRUE METRIX BLOOD GLUCOSE TEST) test strip USE AS DIRECTED BY PHYSICIAN 07/31/14  Yes Quentin Angst, MD  hydrochlorothiazide (HYDRODIURIL) 25 MG tablet Take 1 tablet (25 mg total) by mouth daily. 10/24/15  Yes Quentin Angst, MD  hydrocortisone-pramoxine (ANALPRAM-HC) 2.5-1 % rectal cream Place 1 application rectally 2 (two) times daily. Patient taking differently: Place 1 application rectally 2 (two) times daily as needed for hemorrhoids.  11/16/14  Yes Jaclyn Shaggy, MD  hydrOXYzine (ATARAX/VISTARIL) 10 MG tablet TAKE 1 TABLET BY MOUTH THREE TIMES DAILY AS NEEDED 07/30/15  Yes Quentin Angst, MD  Lancets (ACCU-CHEK SOFT TOUCH) lancets Use as instructed 02/22/15  Yes Quentin Angst, MD  metFORMIN (GLUCOPHAGE-XR) 750 MG 24 hr tablet Take 1 tablet (750 mg total) by mouth daily with breakfast. 10/24/15  Yes  Quentin Angst, MD  methocarbamol (ROBAXIN) 500 MG tablet Take 1 tablet (500 mg total) by mouth every 6 (six) hours as needed for muscle spasms. 10/24/15  Yes Quentin Angst, MD  metoCLOPramide (REGLAN) 5 MG tablet Take 5 mg by mouth every 6 (six) hours as needed for nausea or vomiting.   Yes Historical Provider, MD  mometasone-formoterol (DULERA) 100-5 MCG/ACT AERO Inhale 2 puffs into the lungs 2 (two) times daily. 10/24/15  Yes Quentin Angst, MD  oxyCODONE (OXY IR/ROXICODONE) 5 MG immediate release tablet Take 1-2 tablets (5-10 mg total) by mouth every 3 (three) hours as needed for breakthrough pain. 09/09/15  Yes Kerrin Champagne, MD  oxyCODONE (OXYCONTIN) 10 mg 12 hr tablet Take 1 tablet (10 mg total) by mouth every 12 (twelve) hours. 09/09/15  Yes Kerrin Champagne, MD  pantoprazole (PROTONIX) 40 MG tablet Take 1 tablet (40 mg total) by mouth daily. 10/24/15  Yes Quentin Angst, MD  potassium chloride SA (K-DUR,KLOR-CON) 20 MEQ tablet Take 1 tablet (20 mEq total) by mouth daily. 10/24/15  Yes Quentin Angst, MD  rivaroxaban (XARELTO) 10 MG TABS tablet Take 1 tablet (10 mg total) by mouth daily with breakfast. 09/09/15  Yes Kerrin Champagne, MD  simvastatin (ZOCOR) 20 MG tablet Take 1 tablet (20 mg total) by mouth at bedtime. 10/24/15  Yes Quentin Angst, MD     Objective:   Vitals:   10/24/15 1240  BP: 138/88  Pulse: 72  Resp: 18  Temp: 98.8 F (37.1 C)  TempSrc: Oral  SpO2: 100%  Weight: 148 lb 12.8 oz (67.5 kg)  Height: 5\' 6"  (1.676 m)    Exam General appearance : Awake, alert, not in any distress. Speech Clear. Not toxic looking HEENT: Atraumatic and Normocephalic, pupils equally reactive to light and accomodation Neck: Supple, no JVD. No cervical lymphadenopathy.  Chest: Good air entry bilaterally, no added sounds  CVS: S1 S2 regular, no murmurs.  Abdomen: Bowel sounds present, Non tender and not distended with no gaurding, rigidity or rebound. Extremities: Knee  replacement surgical scar on right knee, healed, tender. B/L Lower Ext shows no edema, both legs are warm to touch Neurology: Awake alert, and oriented X 3, CN II-XII intact, Non focal Skin: No Rash  Pelvic Exam: Cervix normal in appearance, external genitalia normal, no adnexal masses or tenderness, no cervical motion tenderness, rectovaginal septum normal, uterus normal size, shape, and consistency and vagina normal without discharge   Data Review Lab Results  Component Value Date   HGBA1C 6.8 (H) 09/06/2015   HGBA1C 6.5 (H) 08/30/2015   HGBA1C 7.0 (H) 06/03/2015     Assessment & Plan   1. Healthcare maintenance  - Flu Vaccine QUAD 36+ mos PF IM (Fluarix & Fluzone Quad PF)  2. Type 2 diabetes mellitus without complication, without long-term current use of insulin (HCC)  - simvastatin (ZOCOR) 20 MG tablet; Take 1  tablet (20 mg total) by mouth at bedtime.  Dispense: 90 tablet; Refill: 3 - metFORMIN (GLUCOPHAGE-XR) 750 MG 24 hr tablet; Take 1 tablet (750 mg total) by mouth daily with breakfast.  Dispense: 90 tablet; Refill: 3 - gabapentin (NEURONTIN) 300 MG capsule; Take 1 capsule (300 mg total) by mouth 3 (three) times daily.  Dispense: 270 capsule; Refill: 3  Aim for 30 minutes of exercise most days. Rethink what you drink. Water is great! Aim for 2-3 Carb Choices per meal (30-45 grams) +/- 1 either way  Aim for 0-15 Carbs per snack if hungry  Include protein in moderation with your meals and snacks  Consider reading food labels for Total Carbohydrate and Fat Grams of foods  Consider checking BG at alternate times per day  Continue taking medication as directed Be mindful about how much sugar you are adding to beverages and other foods. Fruit Punch - find one with no sugar  Measure and decrease portions of carbohydrate foods  Make your plate and don't go back for seconds  3. Essential hypertension  - potassium chloride SA (K-DUR,KLOR-CON) 20 MEQ tablet; Take 1 tablet (20  mEq total) by mouth daily.  Dispense: 30 tablet; Refill: 3 - hydrochlorothiazide (HYDRODIURIL) 25 MG tablet; Take 1 tablet (25 mg total) by mouth daily.  Dispense: 90 tablet; Refill: 3  We have discussed target BP range and blood pressure goal. I have advised patient to check BP regularly and to call us back or report to clinic if the numbers are consistently higher than 140/90. We discussed the importance of compliance with medical therapy and DASH diet recommended, consequences of uncontrolled hypertension discussed.  - continue current BP medications  4. Tobacco use disorder  Rinaldo Cloudamela was counseled on the dangers of tobacco use, and was advised to quit. Reviewed strategies to maximize success, including removing cigarettes and smoking materials from environment, stress management and support of family/friends.  5. Pap smear for cervical cancer screening  - Cytology - PAP [place] - Cervicovaginal ancillary only  6. Gastroesophageal reflux disease without esophagitis  - pantoprazole (PROTONIX) 40 MG tablet; Take 1 tablet (40 mg total) by mouth daily.  Dispense: 90 tablet; Refill: 3  7. COPD (HCC)  - mometasone-formoterol (DULERA) 100-5 MCG/ACT AERO; Inhale 2 puffs into the lungs 2 (two) times daily.  Dispense: 1 Inhaler; Refill: 3  8. Bilateral low back pain without sciatica  - methocarbamol (ROBAXIN) 500 MG tablet; Take 1 tablet (500 mg total) by mouth every 6 (six) hours as needed for muscle spasms.  Dispense: 60 tablet; Refill: 3 - acetaminophen-codeine (TYLENOL #3) 300-30 MG tablet; Take 1 tablet by mouth every 4 (four) hours as needed.  Dispense: 90 tablet; Refill: 0  Patient have been counseled extensively about nutrition and exercise  Return in about 3 months (around 01/23/2016) for Hemoglobin A1C and Follow up, DM, Follow up HTN, Follow up Pain and comorbidities.  The patient was given clear instructions to go to ER or return to medical center if symptoms don't improve,  worsen or new problems develop. The patient verbalized understanding. The patient was told to call to get lab results if they haven't heard anything in the next week.   This note has been created with Education officer, [employer]Dragon speech recognition software and smart phrase technology. Any transcriptional errors are unintentional.    Jeanann LewandowskyJEGEDE, Alaiyah Bollman, MD, MHA, FACP, FAAP, CPE [facility] [place], [place] [phone]   10/24/2015, 1:03 PM

## 2015-10-24 NOTE — Patient Instructions (Signed)
Hypertension Hypertension, commonly called high blood pressure, is when the force of blood pumping through your arteries is too strong. Your arteries are the blood vessels that carry blood from your heart throughout your body. A blood pressure reading consists of a higher number over a lower number, such as 110/72. The higher number (systolic) is the pressure inside your arteries when your heart pumps. The lower number (diastolic) is the pressure inside your arteries when your heart relaxes. Ideally you want your blood pressure below 120/80. Hypertension forces your heart to work harder to pump blood. Your arteries may become narrow or stiff. Having untreated or uncontrolled hypertension can cause heart attack, stroke, kidney disease, and other problems. RISK FACTORS Some risk factors for high blood pressure are controllable. Others are not.  Risk factors you cannot control include:   Race. You may be at higher risk if you are African American.  Age. Risk increases with age.  Gender. Men are at higher risk than women before age 45 years. After age 65, women are at higher risk than men. Risk factors you can control include:  Not getting enough exercise or physical activity.  Being overweight.  Getting too much fat, sugar, calories, or salt in your diet.  Drinking too much alcohol. SIGNS AND SYMPTOMS Hypertension does not usually cause signs or symptoms. Extremely high blood pressure (hypertensive crisis) may cause headache, anxiety, shortness of breath, and nosebleed. DIAGNOSIS To check if you have hypertension, your health care provider will measure your blood pressure while you are seated, with your arm held at the level of your heart. It should be measured at least twice using the same arm. Certain conditions can cause a difference in blood pressure between your right and left arms. A blood pressure reading that is higher than normal on one occasion does not mean that you need treatment. If  it is not clear whether you have high blood pressure, you may be asked to return on a different day to have your blood pressure checked again. Or, you may be asked to monitor your blood pressure at home for 1 or more weeks. TREATMENT Treating high blood pressure includes making lifestyle changes and possibly taking medicine. Living a healthy lifestyle can help lower high blood pressure. You may need to change some of your habits. Lifestyle changes may include:  Following the DASH diet. This diet is high in fruits, vegetables, and whole grains. It is low in salt, red meat, and added sugars.  Keep your sodium intake below 2,300 mg per day.  Getting at least 30-45 minutes of aerobic exercise at least 4 times per week.  Losing weight if necessary.  Not smoking.  Limiting alcoholic beverages.  Learning ways to reduce stress. Your health care provider may prescribe medicine if lifestyle changes are not enough to get your blood pressure under control, and if one of the following is true:  You are 18-59 years of age and your systolic blood pressure is above 140.  You are 60 years of age or older, and your systolic blood pressure is above 150.  Your diastolic blood pressure is above 90.  You have diabetes, and your systolic blood pressure is over 140 or your diastolic blood pressure is over 90.  You have kidney disease and your blood pressure is above 140/90.  You have heart disease and your blood pressure is above 140/90. Your personal target blood pressure may vary depending on your medical conditions, your age, and other factors. HOME CARE INSTRUCTIONS    Have your blood pressure rechecked as directed by your health care provider.   Take medicines only as directed by your health care provider. Follow the directions carefully. Blood pressure medicines must be taken as prescribed. The medicine does not work as well when you skip doses. Skipping doses also puts you at risk for  problems.  Do not smoke.   Monitor your blood pressure at home as directed by your health care provider. SEEK MEDICAL CARE IF:   You think you are having a reaction to medicines taken.  You have recurrent headaches or feel dizzy.  You have swelling in your ankles.  You have trouble with your vision. SEEK IMMEDIATE MEDICAL CARE IF:  You develop a severe headache or confusion.  You have unusual weakness, numbness, or feel faint.  You have severe chest or abdominal pain.  You vomit repeatedly.  You have trouble breathing. MAKE SURE YOU:   Understand these instructions.  Will watch your condition.  Will get help right away if you are not doing well or get worse.   This information is not intended to replace advice given to you by your health care provider. Make sure you discuss any questions you have with your health care provider.   Document Released: 01/19/2005 Document Revised: 06/05/2014 Document Reviewed: 11/11/2012 Elsevier Interactive Patient Education 2016 Elsevier Inc. Basic Carbohydrate Counting for Diabetes Mellitus Carbohydrate counting is a method for keeping track of the amount of carbohydrates you eat. Eating carbohydrates naturally increases the level of sugar (glucose) in your blood, so it is important for you to know the amount that is okay for you to have in every meal. Carbohydrate counting helps keep the level of glucose in your blood within normal limits. The amount of carbohydrates allowed is different for every person. A dietitian can help you calculate the amount that is right for you. Once you know the amount of carbohydrates you can have, you can count the carbohydrates in the foods you want to eat. Carbohydrates are found in the following foods:  Grains, such as breads and cereals.  Dried beans and soy products.  Starchy vegetables, such as potatoes, peas, and corn.  Fruit and fruit juices.  Milk and yogurt.  Sweets and snack foods, such  as cake, cookies, candy, chips, soft drinks, and fruit drinks. CARBOHYDRATE COUNTING There are two ways to count the carbohydrates in your food. You can use either of the methods or a combination of both. Reading the "Nutrition Facts" on Packaged Food The "Nutrition Facts" is an area that is included on the labels of almost all packaged food and beverages in the [place]. It includes the serving size of that food or beverage and information about the nutrients in each serving of the food, including the grams (g) of carbohydrate per serving.  Decide the number of servings of this food or beverage that you will be able to eat or drink. Multiply that number of servings by the number of grams of carbohydrate that is listed on the label for that serving. The total will be the amount of carbohydrates you will be having when you eat or drink this food or beverage. Learning Standard Serving Sizes of Food When you eat food that is not packaged or does not include "Nutrition Facts" on the label, you need to measure the servings in order to count the amount of carbohydrates.A serving of most carbohydrate-rich foods contains about 15 g of carbohydrates. The following list includes serving sizes of carbohydrate-rich foods  that provide 15 g ofcarbohydrate per serving:   1 slice of bread (1 oz) or 1 six-inch tortilla.    of a hamburger bun or English muffin.  4-6 crackers.   cup unsweetened dry cereal.    cup hot cereal.   cup rice or pasta.    cup mashed potatoes or  of a large baked potato.  1 cup fresh fruit or one small piece of fruit.    cup canned or frozen fruit or fruit juice.  1 cup milk.   cup plain fat-free yogurt or yogurt sweetened with artificial sweeteners.   cup cooked dried beans or starchy vegetable, such as peas, corn, or potatoes.  Decide the number of standard-size servings that you will eat. Multiply that number of servings by 15 (the grams of carbohydrates  in that serving). For example, if you eat 2 cups of strawberries, you will have eaten 2 servings and 30 g of carbohydrates (2 servings x 15 g = 30 g). For foods such as soups and casseroles, in which more than one food is mixed in, you will need to count the carbohydrates in each food that is included. EXAMPLE OF CARBOHYDRATE COUNTING Sample Dinner  3 oz chicken breast.   cup of brown rice.   cup of corn.  1 cup milk.   1 cup strawberries with sugar-free whipped topping.  Carbohydrate Calculation Step 1: Identify the foods that contain carbohydrates:   Rice.   Corn.   Milk.   Strawberries. Step 2:Calculate the number of servings eaten of each:   2 servings of rice.   1 serving of corn.   1 serving of milk.   1 serving of strawberries. Step 3: Multiply each of those number of servings by 15 g:   2 servings of rice x 15 g = 30 g.   1 serving of corn x 15 g = 15 g.   1 serving of milk x 15 g = 15 g.   1 serving of strawberries x 15 g = 15 g. Step 4: Add together all of the amounts to find the total grams of carbohydrates eaten: 30 g + 15 g + 15 g + 15 g = 75 g.   This information is not intended to replace advice given to you by your health care provider. Make sure you discuss any questions you have with your health care provider.   Document Released: 01/19/2005 Document Revised: 02/09/2014 Document Reviewed: 12/16/2012 Elsevier Interactive Patient Education [employer]2016 Elsevier Inc.

## 2015-10-24 NOTE — Discharge Summary (Signed)
Patient ID: Stacy [name] MRN: [id number] DOB/AGE: September 14, 1960 55 y.o.  Admit date: 09/06/2015 Discharge date: 10/24/2015  Admission Diagnoses:  Principal Problem:   DJD (degenerative joint disease) of knee Active Problems:   Osteoarthritis of right knee   Discharge Diagnoses:  Principal Problem:   DJD (degenerative joint disease) of knee Active Problems:   Osteoarthritis of right knee  status post Procedure(s): RIGHT TOTAL KNEE ARTHROPLASTY  Past Medical History:  Diagnosis Date  . Arthritis   . Asthma   . COPD (chronic obstructive pulmonary disease) (HCC)   . Diabetes mellitus    Type II  . GERD (gastroesophageal reflux disease)    Pt on Protonix daily  . Gout   . Headache(784.0)    otc meds prn  . Hyperlipidemia   . Hypertension    Pt on lisinopril  . Irritable bowel syndrome 11/19/2010  . Neuropathy (HCC)   . Shortness of breath    occasional - uses breathing tx at home    Surgeries: Procedure(s): RIGHT TOTAL KNEE ARTHROPLASTY on 09/06/2015   Consultants:   Discharged Condition: Improved  Hospital Course: Stacy [name] is an 55 y.o. female who was admitted 09/06/2015 for operative treatment of DJD (degenerative joint disease) of knee. Patient failed conservative treatments (please see the history and physical for the specifics) and had severe unremitting pain that affects sleep, daily activities and work/hobbies. After pre-op clearance, the patient was taken to the operating room on 09/06/2015 and underwent  Procedure(s): RIGHT TOTAL KNEE ARTHROPLASTY.    Patient was given perioperative antibiotics:  Anti-infectives    Start     Dose/Rate Route Frequency Ordered Stop   09/06/15 1230  ceFAZolin (ANCEF) IVPB 2g/100 mL premix     2 g 200 mL/hr over 30 Minutes Intravenous To [facility] 09/06/15 0758 09/06/15 1341       Patient was given sequential compression devices and early ambulation to prevent DVT.   Patient benefited maximally from hospital  stay and there were no complications. At the time of discharge, the patient was urinating/moving their bowels without difficulty, tolerating a regular diet, pain is controlled with oral pain medications and they have been cleared by PT/OT.   Recent vital signs: No data found.    Recent laboratory studies: No results for input(s): WBC, HGB, HCT, PLT, NA, K, CL, CO2, BUN, CREATININE, GLUCOSE, INR, CALCIUM in the last 72 hours.  Invalid input(s): PT, 2   Discharge Medications:     Medication List    STOP taking these medications   cyclobenzaprine 5 MG tablet Commonly known as:  FLEXERIL     TAKE these medications   accu-chek soft touch lancets Use as instructed   acetaminophen-codeine 300-30 MG tablet Commonly known as:  TYLENOL #3 Take 1 tablet by mouth every 4 (four) hours as needed.   albuterol (2.5 MG/3ML) 0.083% nebulizer solution Commonly known as:  PROVENTIL Take 3 mLs (2.5 mg total) by nebulization every 4 (four) hours as needed for wheezing.   allopurinol 100 MG tablet Commonly known as:  ZYLOPRIM Take 1 tablet (100 mg total) by mouth daily.   colchicine 0.6 MG tablet Take 1 tablet (0.6 mg total) by mouth daily.   docusate sodium 100 MG capsule Commonly known as:  COLACE Take 1 capsule (100 mg total) by mouth 2 (two) times daily.   EPINEPHrine 0.3 mg/0.3 mL Soaj injection Commonly known as:  EPI-PEN Inject 0.3 mLs (0.3 mg total) into the muscle once.   gabapentin 300 MG capsule  Commonly known as:  NEURONTIN Take 1 capsule (300 mg total) by mouth 3 (three) times daily.   glucose blood test strip Commonly known as:  TRUE METRIX BLOOD GLUCOSE TEST USE AS DIRECTED BY PHYSICIAN   glucose blood test strip Commonly known as:  ACCU-CHEK AVIVA Use as instructed   hydrochlorothiazide 25 MG tablet Commonly known as:  HYDRODIURIL Take 1 tablet (25 mg total) by mouth daily.   hydrocortisone-pramoxine 2.5-1 % rectal cream Commonly known as:  ANALPRAM-HC Place 1  application rectally 2 (two) times daily. What changed:  when to take this  reasons to take this   hydrOXYzine 10 MG tablet Commonly known as:  ATARAX/VISTARIL TAKE 1 TABLET BY MOUTH THREE TIMES DAILY AS NEEDED   metFORMIN 750 MG 24 hr tablet Commonly known as:  GLUCOPHAGE-XR Take 1 tablet (750 mg total) by mouth daily with breakfast.   methocarbamol 500 MG tablet Commonly known as:  ROBAXIN Take 1 tablet (500 mg total) by mouth every 6 (six) hours as needed for muscle spasms. What changed:  Another medication with the same name was added. Make sure you understand how and when to take each.   methocarbamol 500 MG tablet Commonly known as:  ROBAXIN Take 1 tablet (500 mg total) by mouth every 6 (six) hours as needed for muscle spasms. What changed:  You were already taking a medication with the same name, and this prescription was added. Make sure you understand how and when to take each.   metoCLOPramide 5 MG tablet Commonly known as:  REGLAN Take 5 mg by mouth every 6 (six) hours as needed for nausea or vomiting.   mometasone-formoterol 100-5 MCG/ACT Aero Commonly known as:  DULERA Inhale 2 puffs into the lungs 2 (two) times daily.   oxyCODONE 5 MG immediate release tablet Commonly known as:  Oxy IR/ROXICODONE Take 1-2 tablets (5-10 mg total) by mouth every 3 (three) hours as needed for breakthrough pain.   oxyCODONE 10 mg 12 hr tablet Commonly known as:  OXYCONTIN Take 1 tablet (10 mg total) by mouth every 12 (twelve) hours.   pantoprazole 40 MG tablet Commonly known as:  PROTONIX Take 1 tablet (40 mg total) by mouth daily.   potassium chloride SA 20 MEQ tablet Commonly known as:  K-DUR,KLOR-CON Take 1 tablet (20 mEq total) by mouth daily.   rivaroxaban 10 MG Tabs tablet Commonly known as:  XARELTO Take 1 tablet (10 mg total) by mouth daily with breakfast.   simvastatin 20 MG tablet Commonly known as:  ZOCOR Take 1 tablet (20 mg total) by mouth at bedtime.    TRUE METRIX METER Devi 1 Device by Does not apply route 4 (four) times daily -  before meals and at bedtime.       Diagnostic Studies: No results found.  Discharge Instructions    CPM    Complete by:  As directed    Continuous passive motion machine (CPM):      Use the CPM from 0 to 60  Degrees for 8 hours per day.      You may increase by 10 deggrees per day.  You may break it up into 2 or 3 sessions per day.      Use CPM for 3 weeks or until you are told to stop.   Call MD / Call 911    Complete by:  As directed    If you experience chest pain or shortness of breath, CALL 911 and be transported to the hospital  emergency room.  If you develope a fever above 101 F, pus (white drainage) or increased drainage or redness at the wound, or calf pain, call your surgeon's office.   Change dressing    Complete by:  As directed    Change dressing on 8/9, then change the dressing daily with sterile 4 x 4 inch gauze dressing and apply TED hose.  You may clean the incision with alcohol prior to redressing.   Constipation Prevention    Complete by:  As directed    Drink plenty of fluids.  Prune juice may be helpful.  You may use a stool softener, such as Colace (over the counter) 100 mg twice a day.  Use MiraLax (over the counter) for constipation as needed.   Diet - low sodium heart healthy    Complete by:  As directed    Diet Carb Modified    Complete by:  As directed    Discharge instructions    Complete by:  As directed    Keep knee incision dry for 5 days post op then may wet while bathing. Therapy daily and CPM goal full extension and greater than 90 degrees flexion. Call if fever or chills or increased drainage. Go to ER if acutely short of breath or call for ambulance. Return for follow up in 2 weeks. May full weight bear on the surgical leg unless told otherwise. Use knee immobilizer until able to straight leg raise off bed with knee stable. In house walking for first 2  weeks. INSTRUCTIONS AFTER JOINT REPLACEMENT   Remove items at home which could result in a fall. This includes throw rugs or furniture in walking pathways ICE to the affected joint every three hours while awake for 30 minutes at a time, for at least the first 3-5 days, and then as needed for pain and swelling.  Continue to use ice for pain and swelling. You may notice swelling that will progress down to the foot and ankle.  This is normal after surgery.  Elevate your leg when you are not up walking on it.   Continue to use the breathing machine you got in the hospital (incentive spirometer) which will help keep your temperature down.  It is common for your temperature to cycle up and down following surgery, especially at night when you are not up moving around and exerting yourself.  The breathing machine keeps your lungs expanded and your temperature down.   DIET:  As you were doing prior to hospitalization, we recommend a well-balanced diet.  DRESSING / WOUND CARE / SHOWERING  You may change your dressing 3-5 days after surgery.  Then change the dressing every day with sterile gauze.  Please use good hand washing techniques before changing the dressing.  Do not use any lotions or creams on the incision until instructed by your surgeon.  ACTIVITY  Increase activity slowly as tolerated, but follow the weight bearing instructions below.   No driving for 6 weeks or until further direction given by your physician.  You cannot drive while taking narcotics.  No lifting or carrying greater than 10 lbs. until further directed by your surgeon. Avoid periods of inactivity such as sitting longer than an hour when not asleep. This helps prevent blood clots.  You may return to work once you are authorized by your doctor.     WEIGHT BEARING   Weight bearing as tolerated with assist device (walker, cane, etc) as directed, use it as long as suggested by  your surgeon or therapist, typically at least 4-6  weeks.   EXERCISES  Results after joint replacement surgery are often greatly improved when you follow the exercise, range of motion and muscle strengthening exercises prescribed by your doctor. Safety measures are also important to protect the joint from further injury. Any time any of these exercises cause you to have increased pain or swelling, decrease what you are doing until you are comfortable again and then slowly increase them. If you have problems or questions, call your caregiver or physical therapist for advice.   Rehabilitation is important following a joint replacement. After just a few days of immobilization, the muscles of the leg can become weakened and shrink (atrophy).  These exercises are designed to build up the tone and strength of the thigh and leg muscles and to improve motion. Often times heat used for twenty to thirty minutes before working out will loosen up your tissues and help with improving the range of motion but do not use heat for the first two weeks following surgery (sometimes heat can increase post-operative swelling).   These exercises can be done on a training (exercise) mat, on the floor, on a table or on a bed. Use whatever works the best and is most comfortable for you.    Use music or television while you are exercising so that the exercises are a pleasant break in your day. This will make your life better with the exercises acting as a break in your routine that you can look forward to.   Perform all exercises about fifteen times, three times per day or as directed.  You should exercise both the operative leg and the other leg as well.  Exercises include:   Quad Sets - Tighten up the muscle on the front of the thigh (Quad) and hold for 5-10 seconds.   Straight Leg Raises - With your knee straight (if you were given a brace, keep it on), lift the leg to 60 degrees, hold for 3 seconds, and slowly lower the leg.  Perform this exercise against resistance later as  your leg gets stronger.  Leg Slides: Lying on your back, slowly slide your foot toward your buttocks, bending your knee up off the floor (only go as far as is comfortable). Then slowly slide your foot back down until your leg is flat on the floor again.  Angel Wings: Lying on your back spread your legs to the side as far apart as you can without causing discomfort.  Hamstring Strength:  Lying on your back, push your heel against the floor with your leg straight by tightening up the muscles of your buttocks.  Repeat, but this time bend your knee to a comfortable angle, and push your heel against the floor.  You may put a pillow under the heel to make it more comfortable if necessary.   A rehabilitation program following joint replacement surgery can speed recovery and prevent re-injury in the future due to weakened muscles. Contact your doctor or a physical therapist for more information on knee rehabilitation.    CONSTIPATION  Constipation is defined medically as fewer than three stools per week and severe constipation as less than one stool per week.  Even if you have a regular bowel pattern at home, your normal regimen is likely to be disrupted due to multiple reasons following surgery.  Combination of anesthesia, postoperative narcotics, change in appetite and fluid intake all can affect your bowels.   YOU MUST use at least  one of the following options; they are listed in order of increasing strength to get the job done.  They are all available over the counter, and you may need to use some, POSSIBLY even all of these options:    Drink plenty of fluids (prune juice may be helpful) and high fiber foods Colace 100 mg by mouth twice a day  Senokot for constipation as directed and as needed Dulcolax (bisacodyl), take with full glass of water  Miralax (polyethylene glycol) once or twice a day as needed.  If you have tried all these things and are unable to have a bowel movement in the first 3-4 days  after surgery call either your surgeon or your primary doctor.    If you experience loose stools or diarrhea, hold the medications until you stool forms back up.  If your symptoms do not get better within 1 week or if they get worse, check with your doctor.  If you experience "the worst abdominal pain ever" or develop nausea or vomiting, please contact the office immediately for further recommendations for treatment.   ITCHING:  If you experience itching with your medications, try taking only a single pain pill, or even half a pain pill at a time.  You can also use Benadryl over the counter for itching or also to help with sleep.   TED HOSE STOCKINGS:  Use stockings on both legs until for at least 2 weeks or as directed by physician office. They may be removed at night for sleeping.  MEDICATIONS:  See your medication summary on the "After Visit Summary" that nursing will review with you.  You may have some home medications which will be placed on hold until you complete the course of blood thinner medication.  It is important for you to complete the blood thinner medication as prescribed.  PRECAUTIONS:  If you experience chest pain or shortness of breath - call 911 immediately for transfer to the hospital emergency department.   If you develop a fever greater that 101 F, purulent drainage from wound, increased redness or drainage from wound, foul odor from the wound/dressing, or calf pain - CONTACT YOUR SURGEON.                                                   FOLLOW-UP APPOINTMENTS:  If you do not already have a post-op appointment, please call the office for an appointment to be seen by your surgeon.  Guidelines for how soon to be seen are listed in your "After Visit Summary", but are typically between 1-4 weeks after surgery.  OTHER INSTRUCTIONS:   Knee Replacement:  Do not place pillow under knee, focus on keeping the knee straight while resting. CPM instructions: 0-90 degrees, 2 hours in the  morning, 2 hours in the afternoon, and 2 hours in the evening. Place foam block, curve side up under heel at all times except when in CPM or when walking.  DO NOT modify, tear, cut, or change the foam block in any way.  MAKE SURE YOU:  Understand these instructions.  Get help right away if you are not doing well or get worse.    Thank you for letting us be a part of your medical care team.  It is a privilege we respect greatly.  We hope these instructions will help you stay on  track for a fast and full recovery!   Do not put a pillow under the knee. Place it under the heel.    Complete by:  As directed    Driving restrictions    Complete by:  As directed    No driving for 6 weeks   Increase activity slowly as tolerated    Complete by:  As directed    Lifting restrictions    Complete by:  As directed    No lifting for 8 weeks      Follow-up Information    NITKA,Tiffany Calmes E, MD Follow up in 2 week(s).   Specialty:  Orthopedic Surgery Why:  For wound re-check Contact information: [address] [place] [address] [phone]        [facility] .   Why:  Someone from [facility] will contact you to arrange start date and time for therapy. Contact information: [address] [place] [address] [phone]           Discharge Plan:  discharge to home  Disposition:     Signed: Naida Sleight  10/24/2015, 10:50 AM

## 2015-10-24 NOTE — Progress Notes (Signed)
Patient is here for PAP smear  Patient complains of right knee pain being present. Patient has had recent surgery and attends PT.  Patient has taken medication today and patient has eaten today.

## 2015-10-25 LAB — CERVICOVAGINAL ANCILLARY ONLY
Chlamydia: NEGATIVE
Neisseria Gonorrhea: NEGATIVE
Wet Prep (BD Affirm): NEGATIVE

## 2015-10-25 LAB — CYTOLOGY - PAP

## 2015-10-29 ENCOUNTER — Telehealth: Payer: Self-pay | Admitting: *Deleted

## 2015-10-29 ENCOUNTER — Telehealth: Payer: Self-pay | Admitting: Internal Medicine

## 2015-10-29 ENCOUNTER — Other Ambulatory Visit: Payer: Self-pay | Admitting: Internal Medicine

## 2015-10-29 MED ORDER — FLUCONAZOLE 150 MG PO TABS: 150.0000 mg | ORAL_TABLET | Freq: Once | ORAL | 0 refills | Status: AC

## 2015-10-29 NOTE — Telephone Encounter (Signed)
-----   Message from Quentin Angstlugbemiga E Jegede, MD sent at 10/29/2015  9:46 AM EDT ----- Please inform patient that her Pap smear is normal, negative for STD. However, presence of yeast detected. Will prescribe one time dose of diflucan. Pick up from pharmacy [facility] [address] [place]

## 2015-10-29 NOTE — Telephone Encounter (Signed)
Patient returned nurse phone called. Patient is aware of lab results and message from nurse

## 2015-10-29 NOTE — Telephone Encounter (Signed)
Medical Assistant left message on patient's home and cell voicemail. Voicemail states to give a call back to [place] [facility] at [phone].  !!!Please inform patient of pap and std being normal. Patient does have some yeast present and a one time prescription has been sent in for patient to take which is Diflucan at [facility] on [address] !!!

## 2015-10-30 ENCOUNTER — Ambulatory Visit: Payer: Medicaid Other | Admitting: Physical Therapy

## 2015-10-30 ENCOUNTER — Encounter: Payer: Self-pay | Admitting: Physical Therapy

## 2015-10-30 DIAGNOSIS — M25661 Stiffness of right knee, not elsewhere classified: Secondary | ICD-10-CM

## 2015-10-30 DIAGNOSIS — R262 Difficulty in walking, not elsewhere classified: Secondary | ICD-10-CM

## 2015-10-30 DIAGNOSIS — M25561 Pain in right knee: Secondary | ICD-10-CM | POA: Diagnosis not present

## 2015-10-30 DIAGNOSIS — M6281 Muscle weakness (generalized): Secondary | ICD-10-CM

## 2015-10-30 DIAGNOSIS — R2241 Localized swelling, mass and lump, right lower limb: Secondary | ICD-10-CM

## 2015-10-30 NOTE — Therapy (Signed)
[facility] [address] [place], [place], [address] Phone: [phone]   Fax:  [phone]  Physical Therapy Treatment  Patient Details  Name: Stacy [name] MRN: [id number] Date of Birth: 1960-09-22 Referring Provider: [name]  Encounter Date: 10/30/2015      PT End of Session - 10/30/15 1020    Visit Number 5   Date for PT Re-Evaluation 11/22/15   Authorization Type Medicaid   PT Start Time 0930   PT Stop Time 1031   PT Time Calculation (min) 61 min   Activity Tolerance Patient limited by pain   Behavior During Therapy [facility] for tasks assessed/performed      Past Medical History:  Diagnosis Date  . Arthritis   . Asthma   . COPD (chronic obstructive pulmonary disease) ([place])   . Diabetes mellitus    Type II  . GERD (gastroesophageal reflux disease)    Pt on Protonix daily  . Gout   . Headache(784.0)    otc meds prn  . Hyperlipidemia   . Hypertension    Pt on lisinopril  . Irritable bowel syndrome 11/19/2010  . Neuropathy ([place])   . Shortness of breath    occasional - uses breathing tx at home    Past Surgical History:  Procedure Laterality Date  . CHOLECYSTECTOMY    . ENDOMETRIAL ABLATION  10/2010  . HERNIA REPAIR     umbicial hernia  . LUMBAR DISC SURGERY  06/03/2015   L 2  L3 L4 L5   . LUMBAR LAMINECTOMY/DECOMPRESSION MICRODISCECTOMY N/A 06/03/2015   Procedure: Bilateral lateral recess decompression L2-3, L3-4, L4-5;  Surgeon: [name], MD;  Location: [facility];  Service: Orthopedics;  Laterality: N/A;  . svd      x 2  . TOTAL KNEE ARTHROPLASTY Right 09/06/2015   Procedure: RIGHT TOTAL KNEE ARTHROPLASTY;  Surgeon: [name], MD;  Location: [facility];  Service: Orthopedics;  Laterality: Right;  . TUBAL LIGATION    . UPPER GASTROINTESTINAL ENDOSCOPY  04/28/11    There were no vitals filed for this visit.      Subjective Assessment - 10/30/15 0932    Subjective "Its been a task"   Currently in  Pain? No/denies   Pain Score 0-No pain                         [facility] Adult PT Treatment/Exercise - 10/30/15 0001      Knee/Hip Exercises: Stretches   Other Knee/Hip Stretches low load long duration stretches for knee extension with 6# x 2 minutes 2 reps   Other Knee/Hip Stretches RLE bent planted on floor slide to the edg of table 30sec x3     Knee/Hip Exercises: Aerobic   Nustep level 4 x 8 minutes, with cues to bend knee and decrease the hip hike     Knee/Hip Exercises: Machines for Strengthening   Cybex Knee Extension 5# with some assist   Cybex Knee Flexion 20# 3x10, 10# right leg only   Cybex Leg Press 20# 2x10, no weight both legs working on flexion, then right only without weight     Modalities   Modalities Vasopneumatic     Vasopneumatic   Number Minutes Vasopneumatic  15 minutes   Vasopnuematic Location  Knee   Vasopneumatic Pressure Medium   Vasopneumatic Temperature  32     Manual Therapy   Manual Therapy Joint mobilization;Passive ROM   Joint Mobilization right knee  to gain flexion and extension   Passive ROM PROM for flexion and extension, some contract relax methods                  PT Short Term Goals - 10/09/15 0920      PT SHORT TERM GOAL #1   Title I with initial HEP   Status Achieved           PT Long Term Goals - 10/23/15 1011      PT LONG TERM GOAL #1   Title I with advanced HEP   Status Partially Met     PT LONG TERM GOAL #2   Title increase AROM of the righ tknee to 10-100 degrees flexion   Status Not Met     PT LONG TERM GOAL #3   Title decreased pain 50% with ADLs   Status Partially Met     PT LONG TERM GOAL #4   Title walk with cane for community distances   Status Partially Met               Plan - 10/30/15 1023    Clinical Impression Statement Pt enters clinic with ACE bandage on R knee per her choice. Pt with very poor R knee ROM that affects her gait. Pt R knee cap very hypomobile when  she relaxes.    Rehab Potential Good   Clinical Impairments Affecting Rehab Potential Medicaid will limit her visits, which will make recovery difficult   PT Frequency 2x / week   PT Duration 8 weeks   PT Treatment/Interventions ADLs/Self Care Home Management;Electrical Stimulation;Therapeutic exercise;Balance training;Neuromuscular re-education;Patient/family education;Gait training;Manual techniques;Passive range of motion;Stair training;Functional mobility training;Cryotherapy;Therapeutic activities;Vasopneumatic Device   PT Next Visit Plan Pt to start independent program.      Patient will benefit from skilled therapeutic intervention in order to improve the following deficits and impairments:  Abnormal gait, Cardiopulmonary status limiting activity, Decreased activity tolerance, Decreased mobility, Decreased range of motion, Decreased strength, Difficulty walking, Increased muscle spasms, Impaired flexibility, Improper body mechanics, Postural dysfunction, Pain, Increased edema, Decreased scar mobility  Visit Diagnosis: Stiffness of right knee, not elsewhere classified  Pain in right knee  Difficulty in walking, not elsewhere classified  Localized swelling, mass and lump, right lower limb  Muscle weakness (generalized)     Problem List Patient Active Problem List   Diagnosis Date Noted  . Pap smear for cervical cancer screening 10/24/2015  . Healthcare maintenance 10/24/2015  . Osteoarthritis of right knee 09/06/2015  . Type 2 diabetes mellitus without complication, without long-term current use of insulin ([place]) 06/13/2015  . Neurogenic claudication due to lumbar spinal stenosis 06/03/2015    Class: Chronic  . Chondromalacia of both patellae 06/03/2015    Class: Chronic  . Spinal stenosis, lumbar region, with neurogenic claudication 06/03/2015  . Tobacco use disorder 04/25/2015  . DJD (degenerative joint disease) of knee 01/04/2015  . Falls frequently 11/29/2014  .  Hemorrhoid 11/14/2014  . Bilateral knee pain 07/31/2014  . Asthma, chronic 07/19/2014  . Gout of big toe 07/19/2014  . Midline low back pain with right-sided sciatica 01/01/2014  . Bilateral low back pain without sciatica 08/14/2013  . Essential hypertension 08/14/2013  . Gastroesophageal reflux disease without esophagitis 08/14/2013  . COPD exacerbation ([place]) 08/14/2013  . Colon cancer screening 08/14/2013  . Low back pain 05/15/2013  . Preventative health care 05/15/2013  . GERD (gastroesophageal reflux disease) 02/16/2013  . DM (diabetes mellitus) ([place]) 02/16/2013  . COPD (chronic obstructive  pulmonary disease) ([place]) 04/17/2011  . Hypertension 04/17/2011  . Cough 04/16/2011    PHYSICAL THERAPY DISCHARGE SUMMARY  Visits from Start of Care: 5   Plan: Patient agrees to discharge.  Patient goals were not met. Patient is being discharged due to financial reasons.  ?????       Scot Jun, PTA  10/30/2015, 10:26 AM  [facility] [address] [place], [place], [address] Phone: [phone]   Fax:  [phone]  Name: Stacy [name] MRN: [id number] Date of Birth: 06/05/1960

## 2015-11-01 ENCOUNTER — Other Ambulatory Visit: Payer: Self-pay | Admitting: Internal Medicine

## 2015-11-01 DIAGNOSIS — J4530 Mild persistent asthma, uncomplicated: Secondary | ICD-10-CM

## 2015-11-29 ENCOUNTER — Encounter (INDEPENDENT_AMBULATORY_CARE_PROVIDER_SITE_OTHER): Payer: Self-pay | Admitting: Specialist

## 2015-11-29 ENCOUNTER — Ambulatory Visit (INDEPENDENT_AMBULATORY_CARE_PROVIDER_SITE_OTHER): Payer: Medicaid Other | Admitting: Specialist

## 2015-11-29 VITALS — BP 117/84 | HR 93 | Ht 66.0 in | Wt 148.0 lb

## 2015-11-29 DIAGNOSIS — M24661 Ankylosis, right knee: Secondary | ICD-10-CM

## 2015-11-29 MED ORDER — HYDROCODONE-ACETAMINOPHEN 10-325 MG PO TABS: 1.0000 | ORAL_TABLET | Freq: Two times a day (BID) | ORAL | 0 refills | Status: DC | PRN

## 2015-11-29 NOTE — Progress Notes (Addendum)
Post-Op Visit Note   Patient: Stacy [name]           Date of Birth: [birth year]           MRN: [id number] Visit Date: 11/29/2015 PCP: Jeanann Lewandowsky, MD     Chief Complaint:  Patient returns as a 2 week recheck on her right knee. Patient is status post 09/06/2015 for right knee arthroplasty. Complains today that it has been aching and throbbing all night. Still having trouble bending knee. States experiencing swelling at times. States she is going to therapy 2-3 times a week. Patient states that she is not making any progress with her knee range of motion. Cannot get to full extension and only has about 30-40 knee flexion. Amlodipine with a cane. Continues to have pain in the left knee where she has known DJD. I previously advised patient that if she did not make any progress that he may ultimately come down to needing knee manipulation under anesthesia for postop arthrofibrosis. Visit Diagnoses:  1. postop Arthrofibrosis of knee joint, right    Plan: Advised patient that best treatment option at this point would be outpatient knee manipulation under anesthesia. Advised patient that after the procedure she will need to start formal PT at least 5 days per week. We'll need to use CPM.  Follow-Up Instructions: Return in about 1 week (around 12/06/2015) for postop.   Orders:  No orders of the defined types were placed in this encounter.  Meds ordered this encounter  Medications  . HYDROcodone-acetaminophen (NORCO) 10-325 MG tablet    Sig: Take 1 tablet by mouth every 12 (twelve) hours as needed.    Dispense:  40 tablet    Refill:  0   Exam: Pleasant female alert and oriented 3 and in no acute distress. Patient relates with a cane. Right knee range of motion about 15-40. Knee is tender and she is neurovascularly intact.. No signs of any infection.  PMFS History: Patient Active Problem List   Diagnosis Date Noted  . postop Arthrofibrosis of knee joint, right 11/29/2015  . Pap  smear for cervical cancer screening 10/24/2015  . Healthcare maintenance 10/24/2015  . Osteoarthritis of right knee 09/06/2015  . Type 2 diabetes mellitus without complication, without long-term current use of insulin ([facility]) 06/13/2015  . Neurogenic claudication due to lumbar spinal stenosis 06/03/2015    Class: Chronic  . Chondromalacia of both patellae 06/03/2015    Class: Chronic  . Spinal stenosis, lumbar region, with neurogenic claudication 06/03/2015  . Tobacco use disorder 04/25/2015  . DJD (degenerative joint disease) of knee 01/04/2015  . Falls frequently 11/29/2014  . Hemorrhoid 11/14/2014  . Bilateral knee pain 07/31/2014  . Asthma, chronic 07/19/2014  . Gout of big toe 07/19/2014  . Midline low back pain with right-sided sciatica 01/01/2014  . Bilateral low back pain without sciatica 08/14/2013  . Essential hypertension 08/14/2013  . Gastroesophageal reflux disease without esophagitis 08/14/2013  . COPD exacerbation ([facility]) 08/14/2013  . Colon cancer screening 08/14/2013  . Low back pain 05/15/2013  . Preventative health care 05/15/2013  . GERD (gastroesophageal reflux disease) 02/16/2013  . DM (diabetes mellitus) ([facility]) 02/16/2013  . COPD (chronic obstructive pulmonary disease) ([facility]) 04/17/2011  . Hypertension 04/17/2011  . Cough 04/16/2011   Past Medical History:  Diagnosis Date  . Arthritis   . Asthma   . COPD (chronic obstructive pulmonary disease) ([facility])   . Diabetes mellitus    Type II  . GERD (gastroesophageal reflux disease)  Pt on Protonix daily  . Gout   . Headache(784.0)    otc meds prn  . Hyperlipidemia   . Hypertension    Pt on lisinopril  . Irritable bowel syndrome 11/19/2010  . Neuropathy ([facility])   . Shortness of breath    occasional - uses breathing tx at home    Family History  Problem Relation Age of Onset  . Hypertension Father   . Cancer Father   . Asthma Son     had as a child  . Heart disease Sister   . Breast cancer Sister   .  Hypertension Brother     Past Surgical History:  Procedure Laterality Date  . CHOLECYSTECTOMY    . ENDOMETRIAL ABLATION  10/2010  . HERNIA REPAIR     umbicial hernia  . LUMBAR DISC SURGERY  06/03/2015   L 2  L3 L4 L5   . LUMBAR LAMINECTOMY/DECOMPRESSION MICRODISCECTOMY N/A 06/03/2015   Procedure: Bilateral lateral recess decompression L2-3, L3-4, L4-5;  Surgeon: Kerrin ChampagneJames E Nitka, MD;  Location: [facility];  Service: Orthopedics;  Laterality: N/A;  . svd      x 2  . TOTAL KNEE ARTHROPLASTY Right 09/06/2015   Procedure: RIGHT TOTAL KNEE ARTHROPLASTY;  Surgeon: Kerrin ChampagneJames E Nitka, MD;  Location: [facility];  Service: Orthopedics;  Laterality: Right;  . TUBAL LIGATION    . UPPER GASTROINTESTINAL ENDOSCOPY  04/28/11   Social History   Occupational History  . unemployed Unemployed   Social History Main Topics  . Smoking status: Current Every Day Smoker    Packs/day: 1.00    Years: 32.00    Types: Cigarettes  . Smokeless tobacco: Never Used  . Alcohol use No  . Drug use: No  . Sexual activity: Yes    Birth control/ protection: Surgical

## 2015-12-03 ENCOUNTER — Telehealth (INDEPENDENT_AMBULATORY_CARE_PROVIDER_SITE_OTHER): Payer: Self-pay | Admitting: *Deleted

## 2015-12-03 NOTE — Telephone Encounter (Signed)
Patient called and states that Dr. Otelia SergeantNitka prescribed her Hydrocodone and it needed Prior Auth because Medicaid wont pay for it. Patient states she can't afford the medication and want something else. She is complaining of pain. She is requesting a call back. Her number is [phone]. Thank you .

## 2015-12-04 NOTE — Telephone Encounter (Signed)
Noted, tried to call patient to advise. No answer, and no vm set up

## 2015-12-04 NOTE — Telephone Encounter (Signed)
I have no intention of prescribing further oxycodone, hydrocodone is appropriate, the oxycodone is extremely addictive, now 2 months post op she needs to decrease her use of strong narcotics. After the manipulation of her knee we may increase the strength.

## 2015-12-04 NOTE — Telephone Encounter (Signed)
See msg below..Would like you to prescribe something else since insurance will not cover.

## 2015-12-05 ENCOUNTER — Encounter (HOSPITAL_COMMUNITY): Payer: Self-pay | Admitting: *Deleted

## 2015-12-05 NOTE — Progress Notes (Signed)
Pt denies SOB, chest pain, and being under the care of a cardiologist. Pt denies having an echo and cardiac cath. Pt stated that her fasting blood glucose usually ranges between 90's-110. Pt made aware of diabetes protocol to check blood glucose (BG)  every 2 hours prior to arrival, do not take Metformin morning of surgery and interventions for a BG <70 and >220. Pt made aware to stop taking vitamins, fish oil and herbal medications. Do not take any NSAIDs ie: Ibuprofen, Advil, Naproxen, BC and Goody Powder. Pt verbalized understanding of all pre-op instructions.

## 2015-12-06 ENCOUNTER — Inpatient Hospital Stay (HOSPITAL_COMMUNITY): Payer: Medicaid Other | Admitting: Anesthesiology

## 2015-12-06 ENCOUNTER — Other Ambulatory Visit: Payer: Self-pay | Admitting: Pharmacist

## 2015-12-06 ENCOUNTER — Encounter (HOSPITAL_COMMUNITY): Payer: Self-pay | Admitting: General Practice

## 2015-12-06 ENCOUNTER — Observation Stay (HOSPITAL_COMMUNITY)
Admission: RE | Admit: 2015-12-06 | Discharge: 2015-12-07 | Disposition: A | Discharge status: Home / Self Care | Payer: Medicaid Other | Source: Ambulatory Visit | Attending: Specialist | Admitting: Specialist

## 2015-12-06 ENCOUNTER — Encounter (HOSPITAL_COMMUNITY)
Admission: RE | Disposition: A | Discharge status: Home / Self Care | Payer: Self-pay | Source: Ambulatory Visit | Attending: Specialist

## 2015-12-06 DIAGNOSIS — I10 Essential (primary) hypertension: Secondary | ICD-10-CM | POA: Diagnosis not present

## 2015-12-06 DIAGNOSIS — K219 Gastro-esophageal reflux disease without esophagitis: Secondary | ICD-10-CM | POA: Insufficient documentation

## 2015-12-06 DIAGNOSIS — M24661 Ankylosis, right knee: Secondary | ICD-10-CM

## 2015-12-06 DIAGNOSIS — J449 Chronic obstructive pulmonary disease, unspecified: Secondary | ICD-10-CM | POA: Insufficient documentation

## 2015-12-06 DIAGNOSIS — M76891 Other specified enthesopathies of right lower limb, excluding foot: Secondary | ICD-10-CM

## 2015-12-06 DIAGNOSIS — E119 Type 2 diabetes mellitus without complications: Secondary | ICD-10-CM | POA: Diagnosis not present

## 2015-12-06 DIAGNOSIS — Z79899 Other long term (current) drug therapy: Secondary | ICD-10-CM | POA: Insufficient documentation

## 2015-12-06 DIAGNOSIS — E1140 Type 2 diabetes mellitus with diabetic neuropathy, unspecified: Secondary | ICD-10-CM | POA: Diagnosis not present

## 2015-12-06 DIAGNOSIS — E785 Hyperlipidemia, unspecified: Secondary | ICD-10-CM | POA: Insufficient documentation

## 2015-12-06 DIAGNOSIS — M1990 Unspecified osteoarthritis, unspecified site: Secondary | ICD-10-CM | POA: Diagnosis not present

## 2015-12-06 DIAGNOSIS — Z96651 Presence of right artificial knee joint: Secondary | ICD-10-CM | POA: Insufficient documentation

## 2015-12-06 HISTORY — PX: KNEE JOINT MANIPULATION: SHX386

## 2015-12-06 HISTORY — DX: Fibrosis due to internal orthopedic prosthetic devices, implants and grafts, initial encounter: T84.82XA

## 2015-12-06 HISTORY — PX: KNEE CLOSED REDUCTION: SHX995

## 2015-12-06 LAB — BASIC METABOLIC PANEL
Anion gap: 11 (ref 5–15)
BUN: 8 mg/dL (ref 6–20)
CO2: 24 mmol/L (ref 22–32)
Calcium: 9.7 mg/dL (ref 8.9–10.3)
Chloride: 102 mmol/L (ref 101–111)
Creatinine, Ser: 0.73 mg/dL (ref 0.44–1.00)
GFR calc Af Amer: >60 mL/min (ref >60)
GFR calc non Af Amer: >60 mL/min (ref >60)
Glucose, Bld: 112 mg/dL — ABNORMAL HIGH (ref 65–99)
Potassium: 3.4 mmol/L — ABNORMAL LOW (ref 3.5–5.1)
Sodium: 137 mmol/L (ref 135–145)

## 2015-12-06 LAB — CBC
HCT: 38 % (ref 36.0–46.0)
Hemoglobin: 12.9 g/dL (ref 12.0–15.0)
MCH: 26 pg (ref 26.0–34.0)
MCHC: 33.9 g/dL (ref 30.0–36.0)
MCV: 76.6 fL — ABNORMAL LOW (ref 78.0–100.0)
Platelets: 256 10*3/uL (ref 150–400)
RBC: 4.96 MIL/uL (ref 3.87–5.11)
RDW: 16.2 % — ABNORMAL HIGH (ref 11.5–15.5)
WBC: 6.8 10*3/uL (ref 4.0–10.5)

## 2015-12-06 LAB — GLUCOSE, CAPILLARY
Glucose-Capillary: 102 mg/dL — ABNORMAL HIGH (ref 65–99)
Glucose-Capillary: 116 mg/dL — ABNORMAL HIGH (ref 65–99)
Glucose-Capillary: 141 mg/dL — ABNORMAL HIGH (ref 65–99)
Glucose-Capillary: 121 mg/dL — ABNORMAL HIGH (ref 65–99)
Glucose-Capillary: 156 mg/dL — ABNORMAL HIGH (ref 65–99)

## 2015-12-06 LAB — PROTIME-INR
INR: 1.1
Prothrombin Time: 14.3 seconds (ref 11.4–15.2)

## 2015-12-06 SURGERY — MANIPULATION, KNEE, CLOSED
Anesthesia: Choice | Laterality: Right

## 2015-12-06 MED ORDER — GABAPENTIN 300 MG PO CAPS
300.0000 mg | ORAL_CAPSULE | Freq: Three times a day (TID) | ORAL | Status: DC
  Administered 2015-12-06 – 2015-12-07 (×3): 300 mg via ORAL
  Filled 2015-12-06 (×3): qty 1

## 2015-12-06 MED ORDER — METHYLPREDNISOLONE 4 MG PO TBPK: 8.0000 mg | ORAL_TABLET | Freq: Every evening | ORAL | Status: DC

## 2015-12-06 MED ORDER — LIDOCAINE HCL (CARDIAC) 20 MG/ML IV SOLN
INTRAVENOUS | Status: DC | PRN
  Administered 2015-12-06: 60 mg via INTRAVENOUS

## 2015-12-06 MED ORDER — MIDAZOLAM HCL 2 MG/2ML IJ SOLN
INTRAMUSCULAR | Status: AC
  Filled 2015-12-06: qty 2

## 2015-12-06 MED ORDER — METHYLPREDNISOLONE 4 MG PO TBPK
4.0000 mg | ORAL_TABLET | ORAL | Status: AC
  Administered 2015-12-06: 4 mg via ORAL

## 2015-12-06 MED ORDER — MORPHINE SULFATE (PF) 2 MG/ML IV SOLN
2.0000 mg | INTRAVENOUS | Status: DC | PRN
  Administered 2015-12-07: 2 mg via INTRAVENOUS
  Filled 2015-12-06: qty 1

## 2015-12-06 MED ORDER — COLCHICINE 0.6 MG PO TABS
0.6000 mg | ORAL_TABLET | Freq: Every day | ORAL | Status: DC
  Administered 2015-12-07: 0.6 mg via ORAL
  Filled 2015-12-06: qty 1

## 2015-12-06 MED ORDER — ACETAMINOPHEN 650 MG RE SUPP: 650.0000 mg | Freq: Four times a day (QID) | RECTAL | Status: DC | PRN

## 2015-12-06 MED ORDER — LIDOCAINE 2% (20 MG/ML) 5 ML SYRINGE
INTRAMUSCULAR | Status: AC
  Filled 2015-12-06: qty 5

## 2015-12-06 MED ORDER — OXYCODONE HCL ER 10 MG PO T12A
10.0000 mg | EXTENDED_RELEASE_TABLET | Freq: Two times a day (BID) | ORAL | Status: DC
  Administered 2015-12-06 – 2015-12-07 (×2): 10 mg via ORAL
  Filled 2015-12-06 (×2): qty 1

## 2015-12-06 MED ORDER — MOMETASONE FURO-FORMOTEROL FUM 100-5 MCG/ACT IN AERO
2.0000 | INHALATION_SPRAY | Freq: Two times a day (BID) | RESPIRATORY_TRACT | Status: DC
  Administered 2015-12-07: 2 via RESPIRATORY_TRACT
  Filled 2015-12-06: qty 8.8

## 2015-12-06 MED ORDER — METOCLOPRAMIDE HCL 5 MG/ML IJ SOLN: 5.0000 mg | Freq: Three times a day (TID) | INTRAMUSCULAR | Status: DC | PRN

## 2015-12-06 MED ORDER — FENTANYL CITRATE (PF) 100 MCG/2ML IJ SOLN
INTRAMUSCULAR | Status: DC | PRN
  Administered 2015-12-06: 50 ug via INTRAVENOUS
  Administered 2015-12-06 (×2): 25 ug via INTRAVENOUS

## 2015-12-06 MED ORDER — ENOXAPARIN SODIUM 40 MG/0.4ML ~~LOC~~ SOLN
40.0000 mg | SUBCUTANEOUS | Status: DC
  Administered 2015-12-07: 40 mg via SUBCUTANEOUS
  Filled 2015-12-06: qty 0.4

## 2015-12-06 MED ORDER — PROMETHAZINE HCL 25 MG/ML IJ SOLN: 6.2500 mg | INTRAMUSCULAR | Status: DC | PRN

## 2015-12-06 MED ORDER — OXYCODONE HCL 5 MG PO TABS: 5.0000 mg | ORAL_TABLET | ORAL | 0 refills | Status: DC | PRN

## 2015-12-06 MED ORDER — HYDROMORPHONE HCL 1 MG/ML IJ SOLN
0.2500 mg | INTRAMUSCULAR | Status: DC | PRN
  Administered 2015-12-06 (×2): 0.25 mg via INTRAVENOUS

## 2015-12-06 MED ORDER — LACTATED RINGERS IV SOLN
INTRAVENOUS | Status: DC
  Administered 2015-12-06 (×2): via INTRAVENOUS

## 2015-12-06 MED ORDER — ONDANSETRON HCL 4 MG PO TABS: 4.0000 mg | ORAL_TABLET | Freq: Four times a day (QID) | ORAL | Status: DC | PRN

## 2015-12-06 MED ORDER — HYDROMORPHONE HCL 1 MG/ML IJ SOLN
INTRAMUSCULAR | Status: AC
  Filled 2015-12-06: qty 0.5

## 2015-12-06 MED ORDER — ADULT MULTIVITAMIN W/MINERALS CH
1.0000 | ORAL_TABLET | Freq: Every day | ORAL | Status: DC
  Administered 2015-12-07: 1 via ORAL
  Filled 2015-12-06: qty 1

## 2015-12-06 MED ORDER — ONDANSETRON HCL 4 MG/2ML IJ SOLN: 4.0000 mg | Freq: Four times a day (QID) | INTRAMUSCULAR | Status: DC | PRN

## 2015-12-06 MED ORDER — PROPOFOL 10 MG/ML IV BOLUS
INTRAVENOUS | Status: AC
  Filled 2015-12-06: qty 40

## 2015-12-06 MED ORDER — BISACODYL 5 MG PO TBEC: 5.0000 mg | DELAYED_RELEASE_TABLET | Freq: Every day | ORAL | Status: DC | PRN

## 2015-12-06 MED ORDER — ALBUTEROL SULFATE (2.5 MG/3ML) 0.083% IN NEBU
2.5000 mg | INHALATION_SOLUTION | Freq: Four times a day (QID) | RESPIRATORY_TRACT | Status: DC
  Administered 2015-12-06: 2.5 mg via RESPIRATORY_TRACT
  Filled 2015-12-06: qty 3

## 2015-12-06 MED ORDER — METHYLPREDNISOLONE 4 MG PO TBPK
8.0000 mg | ORAL_TABLET | Freq: Every evening | ORAL | Status: AC
  Administered 2015-12-06: 8 mg via ORAL

## 2015-12-06 MED ORDER — SODIUM CHLORIDE 0.9 % IV SOLN
INTRAVENOUS | Status: DC
  Administered 2015-12-06 – 2015-12-07 (×2): via INTRAVENOUS

## 2015-12-06 MED ORDER — ACETAMINOPHEN 325 MG PO TABS: 650.0000 mg | ORAL_TABLET | Freq: Four times a day (QID) | ORAL | Status: DC | PRN

## 2015-12-06 MED ORDER — METHYLPREDNISOLONE 4 MG PO TBPK
4.0000 mg | ORAL_TABLET | Freq: Four times a day (QID) | ORAL | Status: DC
Start: 2015-12-08 — End: 2015-12-07

## 2015-12-06 MED ORDER — FENTANYL CITRATE (PF) 100 MCG/2ML IJ SOLN
INTRAMUSCULAR | Status: AC
  Filled 2015-12-06: qty 2

## 2015-12-06 MED ORDER — POLYETHYLENE GLYCOL 3350 17 G PO PACK: 17.0000 g | PACK | Freq: Every day | ORAL | Status: DC | PRN

## 2015-12-06 MED ORDER — PANTOPRAZOLE SODIUM 40 MG PO TBEC
40.0000 mg | DELAYED_RELEASE_TABLET | Freq: Every day | ORAL | Status: DC
  Administered 2015-12-07: 40 mg via ORAL
  Filled 2015-12-06: qty 1

## 2015-12-06 MED ORDER — CHLORHEXIDINE GLUCONATE 4 % EX LIQD: 60.0000 mL | Freq: Once | CUTANEOUS | Status: DC

## 2015-12-06 MED ORDER — PROPOFOL 10 MG/ML IV BOLUS
INTRAVENOUS | Status: DC | PRN
  Administered 2015-12-06: 140 mg via INTRAVENOUS

## 2015-12-06 MED ORDER — METOCLOPRAMIDE HCL 5 MG PO TABS
5.0000 mg | ORAL_TABLET | Freq: Three times a day (TID) | ORAL | Status: DC | PRN
Start: 2015-12-06 — End: 2015-12-07

## 2015-12-06 MED ORDER — METHYLPREDNISOLONE 4 MG PO TBPK
8.0000 mg | ORAL_TABLET | Freq: Every morning | ORAL | Status: AC
  Administered 2015-12-06: 8 mg via ORAL
  Filled 2015-12-06: qty 21

## 2015-12-06 MED ORDER — DOCUSATE SODIUM 100 MG PO CAPS
100.0000 mg | ORAL_CAPSULE | Freq: Two times a day (BID) | ORAL | Status: DC
  Administered 2015-12-06 – 2015-12-07 (×2): 100 mg via ORAL
  Filled 2015-12-06 (×2): qty 1

## 2015-12-06 MED ORDER — OXYCODONE HCL 5 MG PO TABS
ORAL_TABLET | ORAL | Status: AC
  Filled 2015-12-06: qty 1

## 2015-12-06 MED ORDER — FLEET ENEMA 7-19 GM/118ML RE ENEM
1.0000 | ENEMA | Freq: Once | RECTAL | Status: DC | PRN
Start: 2015-12-06 — End: 2015-12-07

## 2015-12-06 MED ORDER — METFORMIN HCL ER 500 MG PO TB24: 750.0000 mg | ORAL_TABLET | Freq: Every day | ORAL | Status: DC

## 2015-12-06 MED ORDER — OXYCODONE HCL 5 MG PO TABS
5.0000 mg | ORAL_TABLET | ORAL | Status: DC | PRN
  Administered 2015-12-06: 5 mg via ORAL

## 2015-12-06 MED ORDER — METHYLPREDNISOLONE 4 MG PO TBPK
4.0000 mg | ORAL_TABLET | Freq: Three times a day (TID) | ORAL | Status: DC
Start: 2015-12-07 — End: 2015-12-07
  Filled 2015-12-06: qty 21

## 2015-12-06 MED ORDER — OXYCODONE HCL ER 10 MG PO T12A: 10.0000 mg | EXTENDED_RELEASE_TABLET | Freq: Two times a day (BID) | ORAL | 0 refills | Status: DC

## 2015-12-06 MED ORDER — METHOCARBAMOL 500 MG PO TABS
500.0000 mg | ORAL_TABLET | Freq: Four times a day (QID) | ORAL | Status: DC | PRN
  Administered 2015-12-06 – 2015-12-07 (×2): 500 mg via ORAL
  Filled 2015-12-06 (×2): qty 1

## 2015-12-06 MED ORDER — MIDAZOLAM HCL 5 MG/5ML IJ SOLN
INTRAMUSCULAR | Status: DC | PRN
  Administered 2015-12-06 (×2): 1 mg via INTRAVENOUS

## 2015-12-06 MED ORDER — METHOCARBAMOL 1000 MG/10ML IJ SOLN
500.0000 mg | Freq: Four times a day (QID) | INTRAVENOUS | Status: DC | PRN
  Filled 2015-12-06: qty 5

## 2015-12-06 MED ORDER — HYDROCHLOROTHIAZIDE 25 MG PO TABS
25.0000 mg | ORAL_TABLET | Freq: Every day | ORAL | Status: DC
  Administered 2015-12-06 – 2015-12-07 (×2): 25 mg via ORAL
  Filled 2015-12-06 (×2): qty 1

## 2015-12-06 MED ORDER — COLCHICINE 0.6 MG PO CAPS: 1.0000 | ORAL_CAPSULE | Freq: Every day | ORAL | 0 refills | Status: DC

## 2015-12-06 MED ORDER — ALBUTEROL SULFATE (2.5 MG/3ML) 0.083% IN NEBU: 2.5000 mg | INHALATION_SOLUTION | RESPIRATORY_TRACT | Status: DC | PRN

## 2015-12-06 SURGICAL SUPPLY — 1 items: BANDAGE ACE 6X5 VEL STRL LF (GAUZE/BANDAGES/DRESSINGS) ×2 IMPLANT

## 2015-12-06 NOTE — Anesthesia Preprocedure Evaluation (Signed)
Anesthesia Evaluation  Patient identified by MRN, date of birth, ID band Patient awake    Reviewed: Allergy & Precautions, NPO status , Patient's Chart, lab work & pertinent test results  Airway Mallampati: II  TM Distance: >3 FB Neck ROM: Full    Dental  (+) Edentulous Upper   Pulmonary shortness of breath, asthma , COPD, Current Smoker,    breath sounds clear to auscultation       Cardiovascular hypertension,  Rhythm:Regular Rate:Normal     Neuro/Psych  Headaches,    GI/Hepatic GERD  ,  Endo/Other  diabetes  Renal/GU      Musculoskeletal  (+) Arthritis ,   Abdominal   Peds  Hematology   Anesthesia Other Findings   Reproductive/Obstetrics                             Anesthesia Physical Anesthesia Plan  ASA: III  Anesthesia Plan: General   Post-op Pain Management:    Induction:   Airway Management Planned: Mask and LMA  Additional Equipment:   Intra-op Plan:   Post-operative Plan:   Informed Consent: I have reviewed the patients History and Physical, chart, labs and discussed the procedure including the risks, benefits and alternatives for the proposed anesthesia with the patient or authorized representative who has indicated his/her understanding and acceptance.   Dental advisory given  Plan Discussed with: CRNA  Anesthesia Plan Comments:         Anesthesia Quick Evaluation

## 2015-12-06 NOTE — Progress Notes (Signed)
     Subjective: Day of Surgery Procedure(s) (LRB): CLOSED MANIPULATION RIGHT KNEE (Right)  Patient reports pain as moderate.    Objective:   VITALS:  Temp:  [97.2 F (36.2 C)-98.2 F (36.8 C)] 98.2 F (36.8 C) (11/03 1604) Pulse Rate:  [70-95] 74 (11/03 1604) Resp:  [11-20] 16 (11/03 1604) BP: (149-187)/(81-97) 149/81 (11/03 1604) SpO2:  [92 %-100 %] 97 % (11/03 1604) Weight:  [67.1 kg (148 lb)] 67.1 kg (148 lb) (11/03 1004) Awake, alert and oriented x4. Received call from Staff director Dr. Jacky KindleAronson indicating that Mrs. Oldenkamp is [facility] guidelines. She is only allowed in Observation post manipulation of her knee. To keep her for therapy alone would be fraud. Neurologically intact ABD soft Neurovascular intact Sensation intact distally Intact pulses distally Dorsiflexion/Plantar flexion intact Incision: no drainage   LABS  Recent Labs  12/06/15 1032  HGB 12.9  WBC 6.8  PLT 256    Recent Labs  12/06/15 1032  NA 137  K 3.4*  CL 102  CO2 24  BUN 8  CREATININE 0.73  GLUCOSE 112*    Recent Labs  12/06/15 1032  INR 1.10     Assessment/Plan: Day of Surgery Procedure(s) (LRB): CLOSED MANIPULATION RIGHT KNEE (Right)  Advance diet Up with therapy Discharge home with home health I discussed the situation with Mrs. Besser and explained that I would prefer to keep her and have her undergo intense PT but her insurance coverage will not allow to more than a 24 hour stay. Will have Physical therapy see her this afternoon and twice tomorrow before discharging her home. It is imperative that she speak with his therapist and try and discern the exercises that she will need to 2 to be able to have a good result of her right total knee arthroplasty that is suffering from arthrofibrosis. I don't really have any options to be able to keep her longer then through tomorrow afternoon due to Medicaid guidelines and concerns about  keeping her knee on 1 Medicaid will allow her. I have refilled her prescriptions for this evening when she be discharged tomorrow evening after therapy in the morning and afternoon. We will plan to see her back in the office in 2 weeks. Kerrin ChampagneJames E Kayd Launer 12/06/2015, 5:45 PM

## 2015-12-06 NOTE — H&P (Signed)
PREOPERATIVE H&P  Chief Complaint: right total knee arthrofibrosis  HPI: Stacy [name] is a 55 y.o. female who presents for preoperative history and physical with a diagnosis of right total knee arthrofibrosis. Symptoms are rated as moderate to severe, and have been worsening.  This is significantly impairing activities of daily living.  She has elected for surgical management.   Past Medical History:  Diagnosis Date  . Arthritis   . 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 Asthma   . COPD (chronic obstructive pulmonary disease) (HCC)   . Diabetes mellitus    Type II  . GERD (gastroesophageal reflux disease)    Pt on Protonix daily  . Gout   . Headache(784.0)    otc meds prn  . Hyperlipidemia   . Hypertension    Pt on lisinopril  . Irritable bowel syndrome 11/19/2010  . Neuropathy (HCC)   . Shortness of breath    occasional - uses breathing tx at home   Past Surgical History:  Procedure Laterality Date  . CHOLECYSTECTOMY    . ENDOMETRIAL ABLATION  10/2010  . HERNIA REPAIR     umbicial hernia  . LUMBAR DISC SURGERY  06/03/2015   L 2  L3 L4 L5   . LUMBAR LAMINECTOMY/DECOMPRESSION MICRODISCECTOMY N/A 06/03/2015   Procedure: Bilateral lateral recess decompression L2-3, L3-4, L4-5;  Surgeon: Kerrin ChampagneJames E Abraham Entwistle, MD;  Location: [facility] OR;  Service: Orthopedics;  Laterality: N/A;  . svd      x 2  . TOTAL KNEE ARTHROPLASTY Right 09/06/2015   Procedure: RIGHT TOTAL KNEE ARTHROPLASTY;  Surgeon: Kerrin ChampagneJames E Mavric Cortright, MD;  Location: [facility] OR;  Service: Orthopedics;  Laterality: Right;  . TUBAL LIGATION    . UPPER GASTROINTESTINAL ENDOSCOPY  04/28/11   Social History   Social History  . Marital status: Married    Spouse name: N/A  . Number of children: 2  . Years of education: N/A   Occupational History  . unemployed Unemployed   Social History Main Topics  . Smoking status: Current Every Day Smoker    Packs/day: 1.00    Years: 32.00    Types: Cigarettes  . Smokeless  tobacco: Never Used  . Alcohol use No  . Drug use: No  . Sexual activity: Yes    Birth control/ protection: Surgical   Other Topics Concern  . None   Social History Narrative  . None   Family History  Problem Relation Age of Onset  . Hypertension Father   . Cancer Father   . Asthma Son     had as a child  . Heart disease Sister   . Breast cancer Sister   . Hypertension Brother    Allergies  Allergen Reactions  . Other Shortness Of Breath    UNSPECIFIED AGENTS Allergic to perfumes and cleaning products  . Ace Inhibitors Other (See Comments) and Cough       . Aspirin Nausea Only    unknown   Prior to Admission medications   Medication Sig Start Date End Date Taking? Authorizing Provider  albuterol (PROVENTIL) (2.5 MG/3ML) 0.083% nebulizer solution USE 1 VIAL VIA NEBULIZER EVERY 4 HOURS AS NEEDED FOR WHEEZING 11/01/15  Yes Quentin Angstlugbemiga E Jegede, MD  allopurinol (ZYLOPRIM) 100 MG tablet Take 1 tablet (100 mg total) by mouth daily. 05/30/15  Yes Quentin Angstlugbemiga E Jegede, MD  Blood Glucose Monitoring Suppl (TRUE METRIX METER) DEVI 1 Device by Does not apply route 4 (four) times daily -  before  meals and at bedtime. 11/14/14  Yes Jaclyn ShaggyEnobong Amao, MD  Colchicine 0.6 MG CAPS Take 1 capsule by mouth daily. 12/06/15  Yes Quentin Angstlugbemiga E Jegede, MD  docusate sodium (COLACE) 100 MG capsule Take 1 capsule (100 mg total) by mouth 2 (two) times daily. 09/09/15  Yes Kerrin ChampagneJames E Hilma Steinhilber, MD  EPINEPHrine 0.3 mg/0.3 mL IJ SOAJ injection Inject 0.3 mLs (0.3 mg total) into the muscle once. Patient taking differently: Inject 0.3 mg into the muscle daily as needed (for allergic reaction).  09/10/14  Yes Quentin Angstlugbemiga E Jegede, MD  gabapentin (NEURONTIN) 300 MG capsule Take 1 capsule (300 mg total) by mouth 3 (three) times daily. 10/24/15  Yes Quentin Angstlugbemiga E Jegede, MD  glucose blood (ACCU-CHEK AVIVA) test strip Use as instructed Patient taking differently: 1 each by Other route 4 (four) times daily. Use as instructed 02/22/15   Yes Olugbemiga E Jegede, MD  glucose blood (TRUE METRIX BLOOD GLUCOSE TEST) test strip USE AS DIRECTED BY PHYSICIAN 07/31/14  Yes Quentin Angstlugbemiga E Jegede, MD  hydrochlorothiazide (HYDRODIURIL) 25 MG tablet Take 1 tablet (25 mg total) by mouth daily. 10/24/15  Yes Quentin Angstlugbemiga E Jegede, MD  hydrocortisone-pramoxine [facility](ANALPRAM-HC) 2.5-1 % rectal cream Place 1 application rectally 2 (two) times daily. Patient taking differently: Place 1 application rectally 2 (two) times daily as needed for hemorrhoids.  11/16/14  Yes Jaclyn ShaggyEnobong Amao, MD  hydrOXYzine (ATARAX/VISTARIL) 10 MG tablet TAKE 1 TABLET BY MOUTH THREE TIMES DAILY AS NEEDED Patient taking differently: TAKE 1 TABLET BY MOUTH THREE TIMES DAILY AS NEEDED FOR ITCHING 07/30/15  Yes Olugbemiga E Hyman HopesJegede, MD  Lancets (ACCU-CHEK SOFT TOUCH) lancets Use as instructed Patient taking differently: 1 each by Other route 4 (four) times daily. Use as instructed 02/22/15  Yes Quentin Angstlugbemiga E Jegede, MD  metFORMIN (GLUCOPHAGE-XR) 750 MG 24 hr tablet Take 1 tablet (750 mg total) by mouth daily with breakfast. 10/24/15  Yes Quentin Angstlugbemiga E Jegede, MD  methocarbamol (ROBAXIN) 500 MG tablet Take 1 tablet (500 mg total) by mouth every 6 (six) hours as needed for muscle spasms. 10/24/15  Yes Quentin Angstlugbemiga E Jegede, MD  metoCLOPramide (REGLAN) 5 MG tablet Take 5 mg by mouth every 6 (six) hours as needed for nausea or vomiting.   Yes Historical Provider, MD  mometasone-formoterol (DULERA) 100-5 MCG/ACT AERO Inhale 2 puffs into the lungs 2 (two) times daily. 10/24/15  Yes Quentin Angstlugbemiga E Jegede, MD  Multiple Vitamin (MULTIVITAMIN WITH MINERALS) TABS tablet Take 1 tablet by mouth daily.   Yes Historical Provider, MD  pantoprazole (PROTONIX) 40 MG tablet Take 1 tablet (40 mg total) by mouth daily. 10/24/15  Yes Quentin Angstlugbemiga E Jegede, MD  potassium chloride SA (K-DUR,KLOR-CON) 20 MEQ tablet Take 1 tablet (20 mEq total) by mouth daily. 10/24/15  Yes Quentin Angstlugbemiga E Jegede, MD  rivaroxaban (XARELTO) 10 MG TABS  tablet Take 1 tablet (10 mg total) by mouth daily with breakfast. 09/09/15  Yes Kerrin ChampagneJames E Ocia Simek, MD  simvastatin (ZOCOR) 20 MG tablet Take 1 tablet (20 mg total) by mouth at bedtime. 10/24/15  Yes Quentin Angstlugbemiga E Jegede, MD  acetaminophen-codeine (TYLENOL #3) 300-30 MG tablet Take 1 tablet by mouth every 4 (four) hours as needed. Patient not taking: Reported on 12/03/2015 10/24/15   Quentin Angstlugbemiga E Jegede, MD  HYDROcodone-acetaminophen (NORCO) 10-325 MG tablet Take 1 tablet by mouth every 12 (twelve) hours as needed. Patient not taking: Reported on 12/03/2015 11/29/15   Naida SleightJames M Owens, PA-C  oxyCODONE (OXY IR/ROXICODONE) 5 MG immediate release tablet Take 1-2 tablets (5-10 mg total)  by mouth every 3 (three) hours as needed for breakthrough pain. Patient not taking: Reported on 11/29/2015 09/09/15   Kerrin Champagne, MD  oxyCODONE (OXYCONTIN) 10 mg 12 hr tablet Take 1 tablet (10 mg total) by mouth every 12 (twelve) hours. Patient not taking: Reported on 11/29/2015 09/09/15   Kerrin Champagne, MD     Positive ROS: All other systems have been reviewed and were otherwise negative with the exception of those mentioned in the HPI and as above.  Physical Exam: General: Alert, no acute distress Cardiovascular: No pedal edema Respiratory: No cyanosis, no use of accessory musculature GI: No organomegaly, abdomen is soft and non-tender Skin: No lesions in the area of chief complaint Neurologic: Sensation intact distally Psychiatric: Patient is competent for consent with normal mood and affect Lymphatic: No axillary or cervical lymphadenopathy  MUSCULOSKELETAL: Right knee with healed anterior incision, ROM -20 to 90 degrees, No effusion, mild warm. Motor intact. Pulses are normal  Assessment: right total knee arthrofibrosis  Plan: Plan for Procedure(s): CLOSED MANIPULATION RIGHT KNEE  The risks benefits and alternatives were discussed with the patient including but not limited to the risks of nonoperative treatment,  versus surgical intervention including infection, bleeding, nerve injury,  blood clots, cardiopulmonary complications, morbidity, mortality, among others, and they were willing to proceed.   Kerrin Champagne, MD Cell [phone] Office [phone] 12/06/2015 2:05 PM

## 2015-12-06 NOTE — Progress Notes (Addendum)
Received call from Dr. Jacky KindleAronson, this patient is under medicaid guidelines and unfortunately the guidelines views arthrofibrosis as a condition that can be treated by out patient manipulation and therapy. Observation is the appropriate category for  her care. Her right total knee replacement is at hyperextension of 5 degrees to 130 degrees flexion with manipulation under anesthesia. Expected improvement would be maintained with PT and ROM of the knee in an intensive PT program. As she is under medicaid guidelines I will plan to discharge her tomorrow and expect a poor outcome due to her insurer not being willing to allow for PT beyond the set limits of the medicaid guidelines. Closed manipulation is helpful in improving out comes of ROM of a TKR when followed by intensive PT.  Intensive PT is not possible in 3 visits per year as dictated by the medicaid guidelines. Will follow as an out patient and determine if she will need further  manipulatiion as time goes by.

## 2015-12-06 NOTE — Op Note (Signed)
     12/06/2015  3:16 PM  PATIENT:  Stacy [name]  55 y.o. female  MRN: [id number]  OPERATIVE REPORT  PRE-OPERATIVE DIAGNOSIS:  right total knee arthrofibrosis  POST-OPERATIVE DIAGNOSIS:  right total knee arthrofibrosis  PROCEDURE:  Procedure(s): CLOSED MANIPULATION RIGHT KNEE total knee arthroplasty   SURGEON:  [name], MD     ASSISTANT:  Benjiman Core, PA-C  (Present throughout the entire procedure and necessary for completion of procedure in a timely manner)     ANESTHESIA:  General,    COMPLICATIONS:  None.   PROCEDURE: The patient was met in the holding area, and the appropriate right knee identified and marked with an "X" and my initials.    The patient was then transported to OR and was placed on the operative table in a supine position. The patient was then placed under  general anesthesia via face mask without difficulty.Time-out procedure was called and correct  .  After patient was demonstrated to be fully relaxed and under general anesthetic the right lower extremity was first placed in full extension and demonstrated a 20 flexion contracture. First the extension of the patient's right knee was performed allowing the patient's heel to be against the OR table and applying direct pressure and then over the anterior aspect of the knee and proximal tibia and proximal femur. With gradual pressure extension of the knee was able to be obtained a knee initially placed out to 0 flexion. The knee then placed into flexion with the right hip flexed 90 the knee demonstrating only flexion the possible to about 60. Direct pressure and then applied to the proximal tibia and further flexion was then able to be obtained using gradual pressure to the anterior proximal tibia and across the entire anterior aspect of the tibia on the right side the knee was able be flexed further and gradually release of the soft tissue over these dorsal superior aspect of the patella was felt to occur.  With this the knee was then able to be flexed of further to 120. The knee then again brought into extension and a bolster using a Ace wrap placed under the right heel then allowed for further anterior pressure over the right knee until the knee was able to be placed and a hyperextension of about 5. This was held in place for several moments until the knee demonstrated easy extension tendencies to recurrence of flexion contracture. The knee then again flexed and found to be at about 120 flexion so that further anterior pressure applied to the right proximal area below the knee joint line and across the anterior aspect of the right tibia so that flexion was able to then be increased to approximately 130-140. This was also held in place then for several moments to allow for creep of the soft tissue over the anterior proximal knee joint line and the extensor mechanism. The knee then brought it into full extension and then flexion able be demonstrated to over 120. Benjiman Core performed cell phone radiographs of both extension and flexion demonstrating the final range of motion of the right total knee arthroplasty with close manipulation. A 6 inch Ace wrap was then applied to the right mid calf to mid thigh area. Patient was then reactivated extubated and returned to the recovery room in satisfactory condition no instruments were used during this procedure.    [name]  12/06/2015, 3:16 PM

## 2015-12-06 NOTE — Transfer of Care (Signed)
Immediate Anesthesia Transfer of Care Note  Patient: Stacy [name]  Procedure(s) Performed: Procedure(s): CLOSED MANIPULATION RIGHT KNEE (Right)  Patient Location: PACU  Anesthesia Type:General  Level of Consciousness: awake, oriented and patient cooperative  Airway & Oxygen Therapy: Patient Spontanous Breathing  Post-op Assessment: Report given to RN and Post -op Vital signs reviewed and stable  Post vital signs: Reviewed and stable  Last Vitals:  Vitals:   12/06/15 1004 12/06/15 1500  BP: (!) 149/93   Pulse: 87   Resp: 18   Temp: 36.6 C 36.5 C    Last Pain:  Vitals:   12/06/15 1049  TempSrc:   PainSc: 4       Patients Stated Pain Goal: 3 (12/06/15 1049)  Complications: No apparent anesthesia complications

## 2015-12-06 NOTE — Evaluation (Signed)
Physical Therapy Evaluation Patient Details Name: Tami Linamela Poliquin MRN: [id number] DOB: 12/24/1960 Today's Date: 12/06/2015   History of Present Illness  Pt presents for right knee manipulation under anesthesia due to arthrofibrosis with PMH of DM, COPD, GERD, spinal stenosis with decompression L2-5, gout, HTN, asthma, HLD, neuropathy  Clinical Impression  Patient presents with continued limited AROM R knee, though had much more in OR.  Still with some gait deviations, but was able to keep foot planted with cues during stand to sit.  Feel she will need to maximize PT in acute setting as likely limited in outpatient setting.      Follow Up Recommendations Outpatient PT    Equipment Recommendations  None recommended by PT    Recommendations for Other Services       Precautions / Restrictions Precautions Precautions: Fall;Knee Restrictions Weight Bearing Restrictions: Yes RLE Weight Bearing: Weight bearing as tolerated      Mobility  Bed Mobility Overal bed mobility: Needs Assistance Bed Mobility: Supine to Sit     Supine to sit: Supervision;HOB elevated     General bed mobility comments: used rail, assist for lines  Transfers Overall transfer level: Needs assistance Equipment used: Rolling walker (2 wheeled) Transfers: Sit to/from Stand Sit to Stand: Min assist         General transfer comment: up from EOB for stabilization  Ambulation/Gait Ambulation/Gait assistance: Min assist;Supervision Ambulation Distance (Feet): 100 Feet   Gait Pattern/deviations: Step-through pattern;Decreased stride length     General Gait Details: utilizing compensatory technique to try to hit heel first (flexing L knee,) cues for normalized gait as much as possible; slow pace, noted R ankle inversion and PF in TS in effort to clear with less flexion  Stairs            Wheelchair Mobility    Modified Rankin (Stroke Patients Only)       Balance Overall balance assessment:  Needs assistance   Sitting balance-Leahy Scale: Good     Standing balance support: Bilateral upper extremity supported Standing balance-Leahy Scale: Poor Standing balance comment: UE support for balance                             Pertinent Vitals/Pain Pain Assessment: Faces Faces Pain Scale: Hurts little more Pain Location: R knee with certain movements/spasms Pain Descriptors / Indicators: Spasm Pain Intervention(s): Monitored during session;Limited activity within patient's tolerance    Home Living Family/patient expects to be discharged to:: Private residence Living Arrangements: Spouse/significant other Available Help at Discharge: Family Type of Home: House Home Access: Stairs to enter Entrance Stairs-Rails: None Entrance Stairs-Number of Steps: 2 Home Layout: Two level Home Equipment: Environmental consultantWalker - 2 wheels;Bedside commode;Tub bench;Walker - 4 wheels      Prior Function Level of Independence: Needs assistance   Gait / Transfers Assistance Needed: using rollator or cane at home  ADL's / Homemaking Assistance Needed: assist with cooking and cleaning        Hand Dominance        Extremity/Trunk Assessment   Upper Extremity Assessment: Overall WFL for tasks assessed           Lower Extremity Assessment: RLE deficits/detail RLE Deficits / Details: AAROM extension approx -18 flexion approx 75        Communication   Communication: No difficulties  Cognition Arousal/Alertness: Awake/alert Behavior During Therapy: WFL for tasks assessed/performed Overall Cognitive Status: Within Functional Limits for tasks assessed  General Comments      Exercises Total Joint Exercises Ankle Circles/Pumps: AROM;Both;10 reps;Supine Quad Sets: AROM;AAROM;Right;5 reps;Supine Heel Slides: AAROM;Right;5 reps;Supine   Assessment/Plan    PT Assessment Patient needs continued PT services  PT Problem List Decreased range of  motion;Decreased mobility;Decreased safety awareness;Decreased activity tolerance;Decreased balance;Decreased knowledge of use of DME;Pain          PT Treatment Interventions DME instruction;Gait training;Stair training;Balance training;Functional mobility training;Therapeutic activities;Therapeutic exercise;Patient/family education;Modalities    PT Goals (Current goals can be found in the Care Plan section)  Acute Rehab PT Goals Patient Stated Goal: To straighten knee PT Goal Formulation: With patient Time For Goal Achievement: 12/08/15 Potential to Achieve Goals: Good    Frequency 7X/week   Barriers to discharge        Co-evaluation               End of Session Equipment Utilized During Treatment: Gait belt Activity Tolerance: Patient limited by pain (spasms during therex) Patient left: with call bell/phone within reach;in chair      Functional Assessment Tool Used: Clinical Judgement    Time: [id number] PT Time Calculation (min) (ACUTE ONLY): 37 min   Charges:   PT Evaluation $PT Eval Moderate Complexity: 1 Procedure PT Treatments $Gait Training: 8-22 mins   PT G Codes:   PT G-Codes **NOT FOR INPATIENT CLASS** Functional Assessment Tool Used: Clinical Judgement    Elray McgregorCynthia Io Dieujuste 12/06/2015, 6:23 PM  Sheran Lawlessyndi Evah Rashid, PT [phone] 12/06/2015

## 2015-12-06 NOTE — Interval H&P Note (Signed)
History and Physical Interval Note:  12/06/2015 2:07 PM  Stacy [name]  has presented today for surgery, with the diagnosis of right total knee arthrofibrosis  The various methods of treatment have been discussed with the patient and family. After consideration of risks, benefits and other options for treatment, the patient has consented to  Procedure(s): CLOSED MANIPULATION RIGHT KNEE (Right) as a surgical intervention .  The patient's history has been reviewed, patient examined, no change in status, stable for surgery.  I have reviewed the patient's chart and labs.  Questions were answered to the patient's satisfaction.     Kerrin ChampagneJames E Nitka

## 2015-12-06 NOTE — Brief Op Note (Signed)
12/06/2015  2:56 PM  PATIENT:  Stacy [name]  55 y.o. female  PRE-OPERATIVE DIAGNOSIS:  right total knee arthrofibrosis  POST-OPERATIVE DIAGNOSIS:  right total knee arthrofibrosis  PROCEDURE:  Procedure(s): CLOSED MANIPULATION RIGHT KNEE (Right)  SURGEON:  Surgeon(s) and Role:    * Kerrin ChampagneJames E Nitka, MD - Primary  PHYSICIAN ASSISTANT: Zonia KiefJames Owens, PA-C  ANESTHESIA:   Mechele Dawleygeneral,via FM,  Dr. Jacklynn BueMassagee.   EBL:  No intake/output data recorded.  BLOOD ADMINISTERED:none  DRAINS: none   LOCAL MEDICATIONS USED:  NONE  SPECIMEN:  No Specimen  DISPOSITION OF SPECIMEN:  N/A  COUNTS:  NO not used.  TOURNIQUET:  * No tourniquets in log *  DICTATION: .Dragon Dictation  PLAN OF CARE: Admit to inpatient   PATIENT DISPOSITION:  PACU - hemodynamically stable.   Delay start of Pharmacological VTE agent (>24hrs) due to surgical blood loss or risk of bleeding: yes

## 2015-12-07 DIAGNOSIS — M24661 Ankylosis, right knee: Secondary | ICD-10-CM | POA: Diagnosis not present

## 2015-12-07 LAB — GLUCOSE, CAPILLARY
Glucose-Capillary: 174 mg/dL — ABNORMAL HIGH (ref 65–99)
Glucose-Capillary: 94 mg/dL (ref 65–99)

## 2015-12-07 NOTE — Anesthesia Postprocedure Evaluation (Signed)
Anesthesia Post Note  Patient: Tami Linamela Much  Procedure(s) Performed: Procedure(s) (LRB): CLOSED MANIPULATION RIGHT KNEE (Right)  Patient location during evaluation: PACU Anesthesia Type: General Level of consciousness: awake and alert Pain management: pain level controlled Vital Signs Assessment: post-procedure vital signs reviewed and stable Respiratory status: spontaneous breathing, nonlabored ventilation, respiratory function stable and patient connected to nasal cannula oxygen Cardiovascular status: blood pressure returned to baseline and stable Postop Assessment: no signs of nausea or vomiting Anesthetic complications: no    Last Vitals:  Vitals:   12/07/15 0500 12/07/15 0718  BP: (!) 145/78   Pulse: 72 78  Resp: 16 16  Temp: 36.9 C     Last Pain:  Vitals:   12/07/15 0500  TempSrc: Oral  PainSc:                  Rayvn Rickerson,JAMES TERRILL

## 2015-12-07 NOTE — Progress Notes (Signed)
Physical Therapy Treatment Patient Details Name: Stacy [name] MRN: [id number] DOB: Jan 28, 1961 Today's Date: 12/07/2015    History of Present Illness Pt presents for right knee manipulation under anesthesia due to arthrofibrosis with PMH of DM, COPD, GERD, spinal stenosis with decompression L2-5, gout, HTN, asthma, HLD, neuropathy    PT Comments    Patient is making good progress with PT.  From a mobility standpoint anticipate patient will be ready for DC home when medically ready.     Follow Up Recommendations  Outpatient PT     Equipment Recommendations  None recommended by PT    Recommendations for Other Services       Precautions / Restrictions Precautions Precautions: Fall;Knee Restrictions Weight Bearing Restrictions: Yes RLE Weight Bearing: Weight bearing as tolerated    Mobility  Bed Mobility Overal bed mobility: Needs Assistance Bed Mobility: Supine to Sit     Supine to sit: Modified independent (Device/Increase time)     General bed mobility comments: increased time and effort  Transfers Overall transfer level: Needs assistance Equipment used: Rolling walker (2 wheeled) Transfers: Sit to/from Stand Sit to Stand: Supervision         General transfer comment: supervision for safety; cues for hand placement; from EOB X2 adn commode X2  Ambulation/Gait Ambulation/Gait assistance: Supervision Ambulation Distance (Feet): 100 Feet Assistive device: Rolling walker (2 wheeled) Gait Pattern/deviations: Step-through pattern Gait velocity: decreased   General Gait Details: cues for R heel strike and increased engagement of R quad during stance phase   Stairs Stairs: Yes Stairs assistance: Min guard Stair Management: No rails;One rail Left;Backwards;Forwards;With walker;Step to pattern Number of Stairs:  (2 steps +3,  3 steps +2, and 1 step X2) General stair comments: therapist demonstrated technique; cues for sequencing/technique and safety; no rails  to simulate entering home and L hand rail to simulate stairs to bathroom  Wheelchair Mobility    Modified Rankin (Stroke Patients Only)       Balance                                    Cognition Arousal/Alertness: Awake/alert Behavior During Therapy: WFL for tasks assessed/performed Overall Cognitive Status: Within Functional Limits for tasks assessed                      Exercises Total Joint Exercises Quad Sets: AROM;Both;10 reps;Supine Heel Slides: AROM;Right;10 reps;Supine;AAROM Straight Leg Raises: AROM;Right;10 reps;Supine Knee Flexion: AROM;Right;5 reps;Seated;Other (comment) (10 sec holds) Goniometric ROM: 15-65 (supine)    General Comments General comments (skin integrity, edema, etc.): pt given HEP handout and reviewed      Pertinent Vitals/Pain Pain Assessment: 0-10 Pain Score: 6  Pain Location: R knee Pain Descriptors / Indicators: Aching;Sore Pain Intervention(s): Limited activity within patient's tolerance;Monitored during session;Repositioned;RN gave pain meds during session    Home Living                      Prior Function            PT Goals (current goals can now be found in the care plan section) Acute Rehab PT Goals Patient Stated Goal: go home Progress towards PT goals: Progressing toward goals    Frequency    7X/week      PT Plan Current plan remains appropriate    Co-evaluation             End of  Session Equipment Utilized During Treatment: Gait belt Activity Tolerance: Patient tolerated treatment well Patient left: with call bell/phone within reach;in chair     Time: [id number] PT Time Calculation (min) (ACUTE ONLY): 59 min  Charges:  $Gait Training: 23-37 mins $Therapeutic Exercise: 8-22 mins $Therapeutic Activity: 8-22 mins                    G Codes:      Derek MoundKellyn R Laken Rog Jaykob Minichiello, PTA Pager: [phone]   12/07/2015, 9:54 AM

## 2015-12-07 NOTE — Progress Notes (Signed)
Patient ID: [name], female   DOB: 09-03-1960, 55 y.o.   MRN: [id number] No acute changes.  Right calf soft.  She understands that aggressive ROM of her right knee is essential to a good outcome.  She will be discharged today after therapy.

## 2015-12-07 NOTE — Discharge Summary (Signed)
Patient ID: Stacy [name] MRN: [id number] DOB/AGE: 1960-04-01 55 y.o.  Admit date: 12/06/2015 Discharge date: 12/07/2015  Admission Diagnoses:  Principal Problem:   postop Arthrofibrosis of knee joint, right   Discharge Diagnoses:  Same  Past Medical History:  Diagnosis Date  . Arthritis   . 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 Asthma   . COPD (chronic obstructive pulmonary disease) (HCC)   . Diabetes mellitus    Type II  . GERD (gastroesophageal reflux disease)    Pt on Protonix daily  . Gout   . Headache(784.0)    otc meds prn  . Hyperlipidemia   . Hypertension    Pt on lisinopril  . Irritable bowel syndrome 11/19/2010  . Neuropathy (HCC)   . Shortness of breath    occasional - uses breathing tx at home    Surgeries: Procedure(s): CLOSED MANIPULATION RIGHT KNEE on 12/06/2015   Consultants:   Discharged Condition: Improved  Hospital Course: Stacy [name] is an 55 y.o. female who was admitted 12/06/2015 for operative treatment ofArthrofibrosis of knee joint, right. Patient has severe unremitting pain that affects sleep, daily activities, and work/hobbies. After pre-op clearance the patient was taken to the operating room on 12/06/2015 and underwent  Procedure(s): CLOSED MANIPULATION RIGHT KNEE.    Patient was given perioperative antibiotics: Anti-infectives    None       Patient was given sequential compression devices, early ambulation, and chemoprophylaxis to prevent DVT.  Patient benefited maximally from hospital stay and there were no complications.    Recent vital signs: Patient Vitals for the past 24 hrs:  BP Temp Temp src Pulse Resp SpO2 Height Weight  12/07/15 0718 - - - 78 16 99 % - -  12/07/15 0500 (!) 145/78 98.5 F (36.9 C) Oral 72 16 100 % - -  12/07/15 0030 (!) 151/88 98.4 F (36.9 C) Oral 77 16 100 % - -  12/06/15 2100 (!) 151/89 99.2 F (37.3 C) Oral 89 16 95 % - -  12/06/15 2012 - - - 85 16 100 % - -  12/06/15  1604 (!) 149/81 98.2 F (36.8 C) Oral 74 16 97 % - -  12/06/15 1545 (!) 151/88 97.2 F (36.2 C) - 71 12 92 % - -  12/06/15 1530 (!) 152/88 - - 70 11 95 % - -  12/06/15 1519 (!) 165/95 - - 81 20 99 % - -  12/06/15 1518 (!) 187/97 - - 91 15 99 % - -  12/06/15 1515 - - - 89 20 100 % - -  12/06/15 1500 (!) 165/97 97.7 F (36.5 C) - 95 14 97 % - -  12/06/15 1004 (!) 149/93 97.8 F (36.6 C) Oral 87 18 100 % 5\' 6"  (1.676 m) 148 lb (67.1 kg)     Recent laboratory studies:  Recent Labs  12/06/15 1032  WBC 6.8  HGB 12.9  HCT 38.0  PLT 256  NA 137  K 3.4*  CL 102  CO2 24  BUN 8  CREATININE 0.73  GLUCOSE 112*  INR 1.10  CALCIUM 9.7     Discharge Medications:     Medication List    TAKE these medications   accu-chek soft touch lancets Use as instructed What changed:  how much to take  how to take this  when to take this  additional instructions   albuterol (2.5 MG/3ML) 0.083% nebulizer solution Commonly known as:  PROVENTIL USE 1 VIAL VIA NEBULIZER EVERY  4 HOURS AS NEEDED FOR WHEEZING   allopurinol 100 MG tablet Commonly known as:  ZYLOPRIM Take 1 tablet (100 mg total) by mouth daily.   Colchicine 0.6 MG Caps Take 1 capsule by mouth daily.   docusate sodium 100 MG capsule Commonly known as:  COLACE Take 1 capsule (100 mg total) by mouth 2 (two) times daily.   EPINEPHrine 0.3 mg/0.3 mL Soaj injection Commonly known as:  EPI-PEN Inject 0.3 mLs (0.3 mg total) into the muscle once. What changed:  when to take this  reasons to take this   gabapentin 300 MG capsule Commonly known as:  NEURONTIN Take 1 capsule (300 mg total) by mouth 3 (three) times daily.   glucose blood test strip Commonly known as:  TRUE METRIX BLOOD GLUCOSE TEST USE AS DIRECTED BY PHYSICIAN What changed:  Another medication with the same name was changed. Make sure you understand how and when to take each.   glucose blood test strip Commonly known as:  ACCU-CHEK AVIVA Use as  instructed What changed:  how much to take  how to take this  when to take this  additional instructions   hydrochlorothiazide 25 MG tablet Commonly known as:  HYDRODIURIL Take 1 tablet (25 mg total) by mouth daily.   hydrocortisone-pramoxine 2.5-1 % rectal cream Commonly known as:  ANALPRAM-HC Place 1 application rectally 2 (two) times daily. What changed:  when to take this  reasons to take this   hydrOXYzine 10 MG tablet Commonly known as:  ATARAX/VISTARIL TAKE 1 TABLET BY MOUTH THREE TIMES DAILY AS NEEDED What changed:  See the new instructions.   metFORMIN 750 MG 24 hr tablet Commonly known as:  GLUCOPHAGE-XR Take 1 tablet (750 mg total) by mouth daily with breakfast.   methocarbamol 500 MG tablet Commonly known as:  ROBAXIN Take 1 tablet (500 mg total) by mouth every 6 (six) hours as needed for muscle spasms.   metoCLOPramide 5 MG tablet Commonly known as:  REGLAN Take 5 mg by mouth every 6 (six) hours as needed for nausea or vomiting.   mometasone-formoterol 100-5 MCG/ACT Aero Commonly known as:  DULERA Inhale 2 puffs into the lungs 2 (two) times daily.   multivitamin with minerals Tabs tablet Take 1 tablet by mouth daily.   oxyCODONE 10 mg 12 hr tablet Commonly known as:  OXYCONTIN Take 1 tablet (10 mg total) by mouth every 12 (twelve) hours. What changed:  Another medication with the same name was added. Make sure you understand how and when to take each.   oxyCODONE 10 mg 12 hr tablet Commonly known as:  OXYCONTIN Take 1 tablet (10 mg total) by mouth every 12 (twelve) hours. What changed:  You were already taking a medication with the same name, and this prescription was added. Make sure you understand how and when to take each.   oxyCODONE 5 MG immediate release tablet Commonly known as:  Oxy IR/ROXICODONE Take 1-2 tablets (5-10 mg total) by mouth every 4 (four) hours as needed for breakthrough pain. What changed:  You were already taking a  medication with the same name, and this prescription was added. Make sure you understand how and when to take each.   pantoprazole 40 MG tablet Commonly known as:  PROTONIX Take 1 tablet (40 mg total) by mouth daily.   potassium chloride SA 20 MEQ tablet Commonly known as:  K-DUR,KLOR-CON Take 1 tablet (20 mEq total) by mouth daily.   rivaroxaban 10 MG Tabs tablet Commonly known as:  XARELTO Take 1 tablet (10 mg total) by mouth daily with breakfast.   simvastatin 20 MG tablet Commonly known as:  ZOCOR Take 1 tablet (20 mg total) by mouth at bedtime.   TRUE METRIX METER Devi 1 Device by Does not apply route 4 (four) times daily -  before meals and at bedtime.       Diagnostic Studies: No results found.  Disposition: 06-Home-Health Care Svc  Discharge Instructions    Call MD / Call 911    Complete by:  As directed    If you experience chest pain or shortness of breath, CALL 911 and be transported to the hospital emergency room.  If you develope a fever above 101 F, pus (white drainage) or increased drainage or redness at the wound, or calf pain, call your surgeon's office.   Call MD / Call 911    Complete by:  As directed    If you experience chest pain or shortness of breath, CALL 911 and be transported to the hospital emergency room.  If you develope a fever above 101 F, pus (white drainage) or increased drainage or redness at the wound, or calf pain, call your surgeon's office.   Constipation Prevention    Complete by:  As directed    Drink plenty of fluids.  Prune juice may be helpful.  You may use a stool softener, such as Colace (over the counter) 100 mg twice a day.  Use MiraLax (over the counter) for constipation as needed.   Constipation Prevention    Complete by:  As directed    Drink plenty of fluids.  Prune juice may be helpful.  You may use a stool softener, such as Colace (over the counter) 100 mg twice a day.  Use MiraLax (over the counter) for constipation as  needed.   Diet - low sodium heart healthy    Complete by:  As directed    Diet - low sodium heart healthy    Complete by:  As directed    Discharge instructions    Complete by:  As directed    Keep knee incisions and dressing dry for 4 days post op then may wet while bathing and apply bandaids. Call if fever or chills or increased drainage. Go to ER if acutely short of breath or call for ambulance. Return for follow up in 2 weeks. May full weight bear on the surgical leg unless told otherwise.Take xarelto prevent blood clots. In house walking for first 2 weeks. Work on both straightening the right knee and bending as far as possible.   Discharge patient    Complete by:  As directed    Driving restrictions    Complete by:  As directed    No driving for 2 weeks   Increase activity slowly as tolerated    Complete by:  As directed    Lifting restrictions    Complete by:  As directed    No lifting for 2 weeks   Weight bearing as tolerated    Complete by:  As directed    You need to walk as much as tolerated to improve your knee function.      Follow-up Information    Kerrin Champagne, MD In 2 weeks.   Specialty:  Orthopedic Surgery Why:  For evaluation of knee ROM. Contact information: [address] [place] [address] [phone]            Signed: [name] 12/07/2015, 8:30 AM

## 2015-12-07 NOTE — Care Management Note (Signed)
Case Management Note  Patient Details  Name: Stacy [name] MRN: [id number] Date of Birth: 07-15-1960  Subjective/Objective:  55 yr old female s/p  Closed manipulation of right total knee arthroplasty.                 Action/Plan: Case manager spoke with patient concerning Home Health needs. Unfortunately, under Medicaid coverage patient is not able to receive HHPT for a closed manipulation. Patient encouraged to do exercises provided by physical therapist. CM will leave message for Dr.      Expected Discharge Date:    114/17              Expected Discharge Plan:   Home/self care  In-House Referral:  NA  Discharge planning Services  CM Consult  Post Acute Care Choice:  Home Health Choice offered to:     DME Arranged:  N/A DME Agency:  NA  HH Arranged:  NA HH Agency:     Status of Service:  Completed, signed off  If discussed at Long Length of Stay Meetings, dates discussed:    Additional Comments:  Durenda GuthrieBrady, Rjay Revolorio Naomi, RN 12/07/2015, 11:38 AM

## 2015-12-07 NOTE — Progress Notes (Signed)
Physical Therapy Treatment Patient Details Name: Stacy [name] MRN: [id number] DOB: 1960-05-16 Today's Date: 12/07/2015    History of Present Illness Pt presents for right knee manipulation under anesthesia due to arthrofibrosis with PMH of DM, COPD, GERD, spinal stenosis with decompression L2-5, gout, HTN, asthma, HLD, neuropathy    PT Comments    Patient continues to progress toward mobility goals. Reviewed importance of performing HEP at least 3Xday and positioning when at rest. Current plan remains appropriate.   Follow Up Recommendations  Outpatient PT     Equipment Recommendations  None recommended by PT    Recommendations for Other Services       Precautions / Restrictions Precautions Precautions: Fall;Knee Restrictions Weight Bearing Restrictions: Yes RLE Weight Bearing: Weight bearing as tolerated    Mobility  Bed Mobility Overal bed mobility: Needs Assistance Bed Mobility: Supine to Sit     Supine to sit: Modified independent (Device/Increase time)     General bed mobility comments: Pt OOB in chair upon arrival  Transfers Overall transfer level: Needs assistance Equipment used: Rolling walker (2 wheeled) Transfers: Sit to/from Stand Sit to Stand: Supervision         General transfer comment: from recliner and commode; cues for hand placement   Ambulation/Gait Ambulation/Gait assistance: Supervision Ambulation Distance (Feet): 150 Feet Assistive device: Rolling walker (2 wheeled) Gait Pattern/deviations: Step-through pattern Gait velocity: decreased   General Gait Details: vc for R heel strike and R knee flexion during swing phase and multimodal cues for improved R knee extension in stance phase; pt with improved gait mechanics with increased distance   Stairs Stairs: Yes Stairs assistance: Min guard Stair Management: No rails;One rail Left;Backwards;Forwards;With walker;Step to pattern Number of Stairs:  (2 steps +3,  3 steps +2, and 1 step  X2) General stair comments: therapist demonstrated technique; cues for sequencing/technique and safety; no rails to simulate entering home and L hand rail to simulate stairs to bathroom  Wheelchair Mobility    Modified Rankin (Stroke Patients Only)       Balance                                    Cognition Arousal/Alertness: Awake/alert Behavior During Therapy: WFL for tasks assessed/performed Overall Cognitive Status: Within Functional Limits for tasks assessed                      Exercises Total Joint Exercises Quad Sets: AROM;Both;10 reps;Supine Heel Slides: AROM;Right;10 reps;Supine;AAROM Straight Leg Raises: AROM;Right;10 reps;Supine Knee Flexion: AROM;Right;5 reps;Seated;Other (comment) (10 sec holds) Goniometric ROM: 15-65 (supine)    General Comments General comments (skin integrity, edema, etc.): reviewed frequency of HEP and positioning       Pertinent Vitals/Pain Pain Assessment: 0-10 Pain Score: 5  Pain Location: R knee Pain Descriptors / Indicators: Aching;Sore Pain Intervention(s): Limited activity within patient's tolerance;Monitored during session;Premedicated before session;Repositioned    Home Living                      Prior Function            PT Goals (current goals can now be found in the care plan section) Acute Rehab PT Goals Patient Stated Goal: go home Progress towards PT goals: Progressing toward goals    Frequency    7X/week      PT Plan Current plan remains appropriate    Co-evaluation  End of Session Equipment Utilized During Treatment: Gait belt Activity Tolerance: Patient tolerated treatment well Patient left: with call bell/phone within reach;in chair     Time: 1319-1335 PT Time Calculation (min) (ACUTE ONLY): 16 min  Charges:  $Gait Training: 8-22 mins                     G Codes:      Stacy MoundKellyn R Sahana [name] Stacy [name], PTA Pager: [phone]  [phone]   12/07/2015, 1:39 PM

## 2015-12-08 ENCOUNTER — Encounter (HOSPITAL_COMMUNITY): Payer: Self-pay | Admitting: Specialist

## 2015-12-11 NOTE — Progress Notes (Signed)
PT G-Code Note   12/06/15 1830  PT G-Codes **NOT FOR INPATIENT CLASS**  Functional Assessment Tool Used Clinical Judgement  Functional Limitation Mobility: Walking and moving around  Mobility: Walking and Moving Around Current Status [phone](G8978) CJ  Mobility: Walking and Moving Around Goal Status [phone](G8979) CI  Richlandyndi Alexandre Lightsey, [place]PT [phone] 12/11/2015

## 2015-12-12 ENCOUNTER — Telehealth (INDEPENDENT_AMBULATORY_CARE_PROVIDER_SITE_OTHER): Payer: Self-pay | Admitting: *Deleted

## 2015-12-12 NOTE — Telephone Encounter (Signed)
I think this is your patient!

## 2015-12-12 NOTE — Telephone Encounter (Signed)
Hope from [facility] called stating pt needs PA for extended release OxyContin. [facility] [phone].

## 2015-12-13 NOTE — Telephone Encounter (Signed)
Completed form on [place] [employer] waiting on authorization to be sent back Thanks.

## 2015-12-16 ENCOUNTER — Encounter (INDEPENDENT_AMBULATORY_CARE_PROVIDER_SITE_OTHER): Payer: Self-pay | Admitting: Specialist

## 2015-12-16 ENCOUNTER — Ambulatory Visit (INDEPENDENT_AMBULATORY_CARE_PROVIDER_SITE_OTHER): Payer: Medicaid Other | Admitting: Specialist

## 2015-12-16 VITALS — BP 121/75 | HR 61 | Ht 66.0 in | Wt 148.0 lb

## 2015-12-16 DIAGNOSIS — M24661 Ankylosis, right knee: Secondary | ICD-10-CM

## 2015-12-16 MED ORDER — OXYCODONE HCL ER 10 MG PO T12A: 10.0000 mg | EXTENDED_RELEASE_TABLET | Freq: Two times a day (BID) | ORAL | 0 refills | Status: DC

## 2015-12-16 MED ORDER — OXYCODONE HCL 5 MG PO TABS: 5.0000 mg | ORAL_TABLET | ORAL | 0 refills | Status: DC | PRN

## 2015-12-16 NOTE — Patient Instructions (Signed)
Start to use a Dynasplint to improve flexion and extension of the right knee. Copies of the photographs from OR demonstrating the right knee at full extension and nearly 135 degrees of flexion given to Mrs. Web. Take xarelto for thinning blood. Ice after exercise and heat to the thigh area before RPM exercises.Oxycodone for pain. Rx for this renewed. New to walk as much as possible to use your weight to break down scar and improve your knee ROM.

## 2015-12-16 NOTE — Progress Notes (Signed)
Post-Op Visit Note   Patient: Stacy [name]           Date of Birth: Oct 08, 1960           MRN: [id number] Visit Date: 12/16/2015 PCP: Jeanann LewandowskyJEGEDE, OLUGBEMIGA, MD   Assessment & Plan:Arthrofibrosis right total knee replacement. Recurring loss of motion nearly 10 days post manipulation.  Chief Complaint:  Patient is returning 1 week post op from closed manipulation right knee. States she is having a lot of pain. Still feels very stiff. Ambulates with walker. Having swelling. Physical [facility].  Visit Diagnoses:  1. postop Arthrofibrosis of knee joint, right     Plan: Start to use a Dynasplint to improve flexion and extension of the right knee. Copies of the photographs from OR demonstrating the right knee at full extension and nearly 135 degrees of flexion given to Mrs. Nicklin. Take xarelto for thinning blood. Ice after exercise and heat to the thigh area before RPM exercises.Oxycodone for pain. Rx for this renewed. New to walk as much as possible to use your weight to break down scar and improve your knee ROM.  Follow-Up Instructions: Return in about 2 weeks (around 12/30/2015), or if symptoms worsen or fail to improve.   Orders:  No orders of the defined types were placed in this encounter.  Meds ordered this encounter  Medications  . oxyCODONE (OXY IR/ROXICODONE) 5 MG immediate release tablet    Sig: Take 1-2 tablets (5-10 mg total) by mouth every 4 (four) hours as needed for breakthrough pain.    Dispense:  50 tablet    Refill:  0  . oxyCODONE (OXYCONTIN) 10 mg 12 hr tablet    Sig: Take 1 tablet (10 mg total) by mouth every 12 (twelve) hours.    Dispense:  30 tablet    Refill:  0     PMFS History: Patient Active Problem List   Diagnosis Date Noted  . Neurogenic claudication due to lumbar spinal stenosis 06/03/2015    Priority: High    Class: Chronic  . Chondromalacia of both patellae 06/03/2015    Priority: High    Class: Chronic  . postop  Arthrofibrosis of knee joint, right 11/29/2015  . Pap smear for cervical cancer screening 10/24/2015  . Healthcare maintenance 10/24/2015  . Osteoarthritis of right knee 09/06/2015  . Type 2 diabetes mellitus without complication, without long-term current use of insulin (HCC) 06/13/2015  . Spinal stenosis, lumbar region, with neurogenic claudication 06/03/2015  . Tobacco use disorder 04/25/2015  . DJD (degenerative joint disease) of knee 01/04/2015  . Falls frequently 11/29/2014  . Hemorrhoid 11/14/2014  . Bilateral knee pain 07/31/2014  . Asthma, chronic 07/19/2014  . Gout of big toe 07/19/2014  . Midline low back pain with right-sided sciatica 01/01/2014  . Bilateral low back pain without sciatica 08/14/2013  . Essential hypertension 08/14/2013  . Gastroesophageal reflux disease without esophagitis 08/14/2013  . COPD exacerbation (HCC) 08/14/2013  . Colon cancer screening 08/14/2013  . Low back pain 05/15/2013  . Preventative health care 05/15/2013  . GERD (gastroesophageal reflux disease) 02/16/2013  . DM (diabetes mellitus) (HCC) 02/16/2013  . COPD (chronic obstructive pulmonary disease) (HCC) 04/17/2011  . Hypertension 04/17/2011  . Cough 04/16/2011   Past Medical History:  Diagnosis Date  . Arthritis   . 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 Asthma   . COPD (chronic obstructive pulmonary disease) (HCC)   . Diabetes mellitus  Type II  . GERD (gastroesophageal reflux disease)    Pt on Protonix daily  . Gout   . Headache(784.0)    otc meds prn  . Hyperlipidemia   . Hypertension    Pt on lisinopril  . Irritable bowel syndrome 11/19/2010  . Neuropathy (HCC)   . Shortness of breath    occasional - uses breathing tx at home    Family History  Problem Relation Age of Onset  . Hypertension Father   . Cancer Father   . Asthma Son     had as a child  . Heart disease Sister   . Breast cancer Sister   . Hypertension Brother     Past Surgical  History:  Procedure Laterality Date  . CHOLECYSTECTOMY    . ENDOMETRIAL ABLATION  10/2010  . HERNIA REPAIR     umbicial hernia  . KNEE CLOSED REDUCTION Right 12/06/2015   Procedure: CLOSED MANIPULATION RIGHT KNEE;  Surgeon: Kerrin ChampagneJames E Seng Fouts, MD;  Location: [facility] OR;  Service: Orthopedics;  Laterality: Right;  . KNEE JOINT MANIPULATION Right 12/06/2015  . LUMBAR DISC SURGERY  06/03/2015   L 2  L3 L4 L5   . LUMBAR LAMINECTOMY/DECOMPRESSION MICRODISCECTOMY N/A 06/03/2015   Procedure: Bilateral lateral recess decompression L2-3, L3-4, L4-5;  Surgeon: Kerrin ChampagneJames E Breea Loncar, MD;  Location: [facility] OR;  Service: Orthopedics;  Laterality: N/A;  . svd      x 2  . TOTAL KNEE ARTHROPLASTY Right 09/06/2015   Procedure: RIGHT TOTAL KNEE ARTHROPLASTY;  Surgeon: Kerrin ChampagneJames E Avigdor Dollar, MD;  Location: [facility] OR;  Service: Orthopedics;  Laterality: Right;  . TUBAL LIGATION    . UPPER GASTROINTESTINAL ENDOSCOPY  04/28/11   Social History   Occupational History  . unemployed Unemployed   Social History Main Topics  . Smoking status: Current Every Day Smoker    Packs/day: 1.00    Years: 32.00    Types: Cigarettes  . Smokeless tobacco: Never Used  . Alcohol use No  . Drug use: No  . Sexual activity: Yes    Birth control/ protection: Surgical

## 2015-12-18 ENCOUNTER — Telehealth (INDEPENDENT_AMBULATORY_CARE_PROVIDER_SITE_OTHER): Payer: Self-pay | Admitting: Specialist

## 2015-12-18 NOTE — Telephone Encounter (Signed)
Hope (nurse Case Manager) with [facility] called advised patient have not received her Rx for (oxycotin 10mg ) control release since she got out of the hospital. [facility] would need to be [place] thru medicaid. Patient uses [facility] and the number is [phone]   The [facility] to contact [facility] is [phone]

## 2015-12-18 NOTE — Telephone Encounter (Signed)
Form filled out from [employer], faxed, waiting on response. Thanks.

## 2015-12-20 NOTE — Telephone Encounter (Signed)
Still waiting for authorization at this time.

## 2016-01-15 ENCOUNTER — Ambulatory Visit (INDEPENDENT_AMBULATORY_CARE_PROVIDER_SITE_OTHER): Payer: Medicaid Other | Admitting: Specialist

## 2016-01-15 ENCOUNTER — Encounter (INDEPENDENT_AMBULATORY_CARE_PROVIDER_SITE_OTHER): Payer: Self-pay | Admitting: Specialist

## 2016-01-15 VITALS — BP 154/85 | HR 83 | Ht 66.0 in | Wt 148.0 lb

## 2016-01-15 DIAGNOSIS — M24661 Ankylosis, right knee: Secondary | ICD-10-CM

## 2016-01-15 DIAGNOSIS — M1711 Unilateral primary osteoarthritis, right knee: Secondary | ICD-10-CM

## 2016-01-15 MED ORDER — HYDROCODONE-ACETAMINOPHEN 10-325 MG PO TABS: 1.0000 | ORAL_TABLET | Freq: Four times a day (QID) | ORAL | 0 refills | Status: DC | PRN

## 2016-01-15 NOTE — Patient Instructions (Signed)
Walk on the right leg as much as possible. Therapy as often as possible to improve your range of motion. The problem is scar tissue and the longer it is stiff the more difficulty improving the motion. Surgery scheduling secretary Tivis RingerSherri Billings, will call you in the next week to schedule for surgery.  Surgery recommended is closed manipulation of the right knee a second time to try and improve the motion.

## 2016-01-15 NOTE — Progress Notes (Signed)
Post-Op Visit Note   Patient: Stacy [name]           Date of Birth: 1960-07-19           MRN: [id number] Visit Date: 01/15/2016 PCP: Jeanann LewandowskyJEGEDE, OLUGBEMIGA, MD   Assessment & Plan:Right knee arthrofibrosis with limited ROM, due to poor therapy and rehabilitation post total knee replacement. Some recurrence of stiffness and loss of motion gained with the last manipulation of the knee 4 weeks ago.  Right knee swelling is decreased, ROM is -30 degrees short of extension, and flexion to 70 degrees. Overall pain with ROM is less, no effusion, no warm or induration.  Chief Complaint:  Patient returns 5 weeks post op from closed manipulation right knee. Doing therapy 2-3 times a week. Feels like she is bending and walking better. Still having stiffness in mornings. Ambulates with cane.  Visit Diagnoses: No diagnosis found.  Plan: Continue with ROM exercises, will schedule for a 2nd closed manipulation to try and improve her ROM here. Hydrocodone for pain, discontinue use of oxycodone. Walk on the right leg as much as possible. Therapy as often as possible to improve your range of motion. The problem is scar tissue and the longer it is stiff the more difficulty improving the motion. Surgery scheduling secretary Tivis RingerSherri Billings, will call you in the next week to schedule for surgery.  Surgery recommended is closed manipulation of the right knee a second time to try and improve the motion.   Follow-Up Instructions: No Follow-up on file.   Orders:  No orders of the defined types were placed in this encounter.  No orders of the defined types were placed in this encounter.    PMFS History: Patient Active Problem List   Diagnosis Date Noted  . postop Arthrofibrosis of knee joint, right 11/29/2015  . Pap smear for cervical cancer screening 10/24/2015  . Healthcare maintenance 10/24/2015  . Osteoarthritis of right knee 09/06/2015  . Type 2 diabetes mellitus without complication, without  long-term current use of insulin (HCC) 06/13/2015  . Neurogenic claudication due to lumbar spinal stenosis 06/03/2015    Class: Chronic  . Chondromalacia of both patellae 06/03/2015    Class: Chronic  . Spinal stenosis, lumbar region, with neurogenic claudication 06/03/2015  . Tobacco use disorder 04/25/2015  . DJD (degenerative joint disease) of knee 01/04/2015  . Falls frequently 11/29/2014  . Hemorrhoid 11/14/2014  . Bilateral knee pain 07/31/2014  . Asthma, chronic 07/19/2014  . Gout of big toe 07/19/2014  . Midline low back pain with right-sided sciatica 01/01/2014  . Bilateral low back pain without sciatica 08/14/2013  . Essential hypertension 08/14/2013  . Gastroesophageal reflux disease without esophagitis 08/14/2013  . COPD exacerbation (HCC) 08/14/2013  . Colon cancer screening 08/14/2013  . Low back pain 05/15/2013  . Preventative health care 05/15/2013  . GERD (gastroesophageal reflux disease) 02/16/2013  . DM (diabetes mellitus) (HCC) 02/16/2013  . COPD (chronic obstructive pulmonary disease) (HCC) 04/17/2011  . Hypertension 04/17/2011  . Cough 04/16/2011   Past Medical History:  Diagnosis Date  . Arthritis   . 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 Asthma   . COPD (chronic obstructive pulmonary disease) (HCC)   . Diabetes mellitus    Type II  . GERD (gastroesophageal reflux disease)    Pt on Protonix daily  . Gout   . Headache(784.0)    otc meds prn  . Hyperlipidemia   . Hypertension    Pt on lisinopril  .  Irritable bowel syndrome 11/19/2010  . Neuropathy (HCC)   . Shortness of breath    occasional - uses breathing tx at home    Family History  Problem Relation Age of Onset  . Hypertension Father   . Cancer Father   . Asthma Son     had as a child  . Heart disease Sister   . Breast cancer Sister   . Hypertension Brother     Past Surgical History:  Procedure Laterality Date  . CHOLECYSTECTOMY    . ENDOMETRIAL ABLATION   10/2010  . HERNIA REPAIR     umbicial hernia  . KNEE CLOSED REDUCTION Right 12/06/2015   Procedure: CLOSED MANIPULATION RIGHT KNEE;  Surgeon: Kerrin ChampagneJames E Gabrien Mentink, MD;  Location: [facility] OR;  Service: Orthopedics;  Laterality: Right;  . KNEE JOINT MANIPULATION Right 12/06/2015  . LUMBAR DISC SURGERY  06/03/2015   L 2  L3 L4 L5   . LUMBAR LAMINECTOMY/DECOMPRESSION MICRODISCECTOMY N/A 06/03/2015   Procedure: Bilateral lateral recess decompression L2-3, L3-4, L4-5;  Surgeon: Kerrin ChampagneJames E Zimri Brennen, MD;  Location: [facility] OR;  Service: Orthopedics;  Laterality: N/A;  . svd      x 2  . TOTAL KNEE ARTHROPLASTY Right 09/06/2015   Procedure: RIGHT TOTAL KNEE ARTHROPLASTY;  Surgeon: Kerrin ChampagneJames E Vora Clover, MD;  Location: [facility] OR;  Service: Orthopedics;  Laterality: Right;  . TUBAL LIGATION    . UPPER GASTROINTESTINAL ENDOSCOPY  04/28/11   Social History   Occupational History  . unemployed Unemployed   Social History Main Topics  . Smoking status: Current Every Day Smoker    Packs/day: 1.00    Years: 32.00    Types: Cigarettes  . Smokeless tobacco: Never Used  . Alcohol use No  . Drug use: No  . Sexual activity: Yes    Birth control/ protection: Surgical

## 2016-01-16 ENCOUNTER — Ambulatory Visit: Payer: Medicaid Other | Attending: Internal Medicine | Admitting: Internal Medicine

## 2016-01-16 ENCOUNTER — Encounter (HOSPITAL_COMMUNITY): Payer: Self-pay | Admitting: *Deleted

## 2016-01-16 ENCOUNTER — Other Ambulatory Visit (HOSPITAL_COMMUNITY): Payer: Self-pay | Admitting: *Deleted

## 2016-01-16 ENCOUNTER — Telehealth (INDEPENDENT_AMBULATORY_CARE_PROVIDER_SITE_OTHER): Payer: Self-pay

## 2016-01-16 ENCOUNTER — Encounter: Payer: Self-pay | Admitting: Internal Medicine

## 2016-01-16 VITALS — BP 111/69 | HR 77 | Temp 98.9°F | Resp 18 | Ht 66.0 in | Wt 154.0 lb

## 2016-01-16 DIAGNOSIS — M170 Bilateral primary osteoarthritis of knee: Secondary | ICD-10-CM | POA: Insufficient documentation

## 2016-01-16 DIAGNOSIS — I10 Essential (primary) hypertension: Secondary | ICD-10-CM | POA: Insufficient documentation

## 2016-01-16 DIAGNOSIS — K219 Gastro-esophageal reflux disease without esophagitis: Secondary | ICD-10-CM | POA: Insufficient documentation

## 2016-01-16 DIAGNOSIS — Z7984 Long term (current) use of oral hypoglycemic drugs: Secondary | ICD-10-CM | POA: Insufficient documentation

## 2016-01-16 DIAGNOSIS — E785 Hyperlipidemia, unspecified: Secondary | ICD-10-CM | POA: Insufficient documentation

## 2016-01-16 DIAGNOSIS — E119 Type 2 diabetes mellitus without complications: Secondary | ICD-10-CM | POA: Diagnosis present

## 2016-01-16 DIAGNOSIS — M545 Low back pain: Secondary | ICD-10-CM | POA: Insufficient documentation

## 2016-01-16 DIAGNOSIS — Z888 Allergy status to other drugs, medicaments and biological substances status: Secondary | ICD-10-CM | POA: Diagnosis not present

## 2016-01-16 DIAGNOSIS — J441 Chronic obstructive pulmonary disease with (acute) exacerbation: Secondary | ICD-10-CM | POA: Diagnosis not present

## 2016-01-16 DIAGNOSIS — E1140 Type 2 diabetes mellitus with diabetic neuropathy, unspecified: Secondary | ICD-10-CM | POA: Insufficient documentation

## 2016-01-16 DIAGNOSIS — G8929 Other chronic pain: Secondary | ICD-10-CM | POA: Insufficient documentation

## 2016-01-16 DIAGNOSIS — Z96651 Presence of right artificial knee joint: Secondary | ICD-10-CM | POA: Diagnosis not present

## 2016-01-16 DIAGNOSIS — Z7901 Long term (current) use of anticoagulants: Secondary | ICD-10-CM | POA: Insufficient documentation

## 2016-01-16 DIAGNOSIS — M109 Gout, unspecified: Secondary | ICD-10-CM | POA: Diagnosis not present

## 2016-01-16 DIAGNOSIS — Z886 Allergy status to analgesic agent status: Secondary | ICD-10-CM | POA: Diagnosis not present

## 2016-01-16 DIAGNOSIS — Z79899 Other long term (current) drug therapy: Secondary | ICD-10-CM | POA: Insufficient documentation

## 2016-01-16 LAB — GLUCOSE, POCT (MANUAL RESULT ENTRY): POC Glucose: 93 mg/dl (ref 70–99)

## 2016-01-16 LAB — POCT GLYCOSYLATED HEMOGLOBIN (HGB A1C): Hemoglobin A1C: 6.3

## 2016-01-16 MED ORDER — METHOCARBAMOL 500 MG PO TABS: 500.0000 mg | ORAL_TABLET | Freq: Four times a day (QID) | ORAL | 3 refills | Status: DC | PRN

## 2016-01-16 MED ORDER — ACETAMINOPHEN-CODEINE #3 300-30 MG PO TABS: 1.0000 | ORAL_TABLET | ORAL | 0 refills | Status: DC | PRN

## 2016-01-16 MED ORDER — HYDROCHLOROTHIAZIDE 25 MG PO TABS: 25.0000 mg | ORAL_TABLET | Freq: Every day | ORAL | 3 refills | Status: DC

## 2016-01-16 MED ORDER — MOMETASONE FURO-FORMOTEROL FUM 100-5 MCG/ACT IN AERO: 2.0000 | INHALATION_SPRAY | Freq: Two times a day (BID) | RESPIRATORY_TRACT | 3 refills | Status: DC

## 2016-01-16 MED ORDER — ALLOPURINOL 100 MG PO TABS: 100.0000 mg | ORAL_TABLET | Freq: Every day | ORAL | 3 refills | Status: DC

## 2016-01-16 MED ORDER — COLCHICINE 0.6 MG PO CAPS: 1.0000 | ORAL_CAPSULE | Freq: Every day | ORAL | 3 refills | Status: DC

## 2016-01-16 MED ORDER — METFORMIN HCL ER 750 MG PO TB24: 750.0000 mg | ORAL_TABLET | Freq: Every day | ORAL | 3 refills | Status: DC

## 2016-01-16 MED ORDER — HYDROXYZINE HCL 10 MG PO TABS: ORAL_TABLET | ORAL | 2 refills | Status: DC

## 2016-01-16 MED ORDER — PANTOPRAZOLE SODIUM 40 MG PO TBEC: 40.0000 mg | DELAYED_RELEASE_TABLET | Freq: Every day | ORAL | 3 refills | Status: DC

## 2016-01-16 MED ORDER — SIMVASTATIN 20 MG PO TABS: 20.0000 mg | ORAL_TABLET | Freq: Every day | ORAL | 3 refills | Status: DC

## 2016-01-16 MED ORDER — RIVAROXABAN 10 MG PO TABS: 10.0000 mg | ORAL_TABLET | Freq: Every day | ORAL | 0 refills | Status: DC

## 2016-01-16 MED ORDER — GABAPENTIN 300 MG PO CAPS: 300.0000 mg | ORAL_CAPSULE | Freq: Three times a day (TID) | ORAL | 3 refills | Status: DC

## 2016-01-16 NOTE — Progress Notes (Signed)
Patient is here for FU DM  Patient complains of right knee pain being present. Patient states she has another procedure in the morning.  Patient has taken medication today. Patient has eaten today.  Patient request a potassium refill

## 2016-01-16 NOTE — Telephone Encounter (Signed)
[name] and states patient is having surgery tomorrow (closed manipulation right knee) and needs clarification if patient should continue taking Xarelto or not?  Please call Shanda BumpsJessica back at [phone]

## 2016-01-16 NOTE — Patient Instructions (Signed)
DASH Eating Plan DASH stands for "Dietary Approaches to Stop Hypertension." The DASH eating plan is a healthy eating plan that has been shown to reduce high blood pressure (hypertension). Additional health benefits may include reducing the risk of type 2 diabetes mellitus, heart disease, and stroke. The DASH eating plan may also help with weight loss. What do I need to know about the DASH eating plan? For the DASH eating plan, you will follow these general guidelines:  Choose foods with less than 150 milligrams of sodium per serving (as listed on the food label).  Use salt-free seasonings or herbs instead of table salt or sea salt.  Check with your health care provider or pharmacist before using salt substitutes.  Eat lower-sodium products. These are often labeled as "low-sodium" or "no salt added."  Eat fresh foods. Avoid eating a lot of canned foods.  Eat more vegetables, fruits, and low-fat dairy products.  Choose whole grains. Look for the word "whole" as the first word in the ingredient list.  Choose fish and skinless chicken or turkey more often than red meat. Limit fish, poultry, and meat to 6 oz (170 g) each day.  Limit sweets, desserts, sugars, and sugary drinks.  Choose heart-healthy fats.  Eat more home-cooked food and less restaurant, buffet, and fast food.  Limit fried foods.  Do not fry foods. Cook foods using methods such as baking, boiling, grilling, and broiling instead.  When eating at a restaurant, ask that your food be prepared with less salt, or no salt if possible. What foods can I eat? Seek help from a dietitian for individual calorie needs. Grains  Whole grain or whole wheat bread. Brown rice. Whole grain or whole wheat pasta. Quinoa, bulgur, and whole grain cereals. Low-sodium cereals. Corn or whole wheat flour tortillas. Whole grain cornbread. Whole grain crackers. Low-sodium crackers. Vegetables  Fresh or frozen vegetables (raw, steamed, roasted, or  grilled). Low-sodium or reduced-sodium tomato and vegetable juices. Low-sodium or reduced-sodium tomato sauce and paste. Low-sodium or reduced-sodium canned vegetables. Fruits  All fresh, canned (in natural juice), or frozen fruits. Meat and Other Protein Products  Ground beef (85% or leaner), grass-fed beef, or beef trimmed of fat. Skinless chicken or turkey. Ground chicken or turkey. Pork trimmed of fat. All fish and seafood. Eggs. Dried beans, peas, or lentils. Unsalted nuts and seeds. Unsalted canned beans. Dairy  Low-fat dairy products, such as skim or 1% milk, 2% or reduced-fat cheeses, low-fat ricotta or cottage cheese, or plain low-fat yogurt. Low-sodium or reduced-sodium cheeses. Fats and Oils  Tub margarines without trans fats. Light or reduced-fat mayonnaise and salad dressings (reduced sodium). Avocado. Safflower, olive, or canola oils. Natural peanut or almond butter. Other  Unsalted popcorn and pretzels. The items listed above may not be a complete list of recommended foods or beverages. Contact your dietitian for more options.  What foods are not recommended? Grains  White bread. White pasta. White rice. Refined cornbread. Bagels and croissants. Crackers that contain trans fat. Vegetables  Creamed or fried vegetables. Vegetables in a cheese sauce. Regular canned vegetables. Regular canned tomato sauce and paste. Regular tomato and vegetable juices. Fruits  Canned fruit in light or heavy syrup. Fruit juice. Meat and Other Protein Products  Fatty cuts of meat. Ribs, chicken wings, bacon, sausage, bologna, salami, chitterlings, fatback, hot dogs, bratwurst, and packaged luncheon meats. Salted nuts and seeds. Canned beans with salt. Dairy  Whole or 2% milk, cream, half-and-half, and cream cheese. Whole-fat or sweetened yogurt. Full-fat cheeses   or blue cheese. Nondairy creamers and whipped toppings. Processed cheese, cheese spreads, or cheese curds. Condiments  Onion and garlic  salt, seasoned salt, table salt, and sea salt. Canned and packaged gravies. Worcestershire sauce. Tartar sauce. Barbecue sauce. Teriyaki sauce. Soy sauce, including reduced sodium. Steak sauce. Fish sauce. Oyster sauce. Cocktail sauce. Horseradish. Ketchup and mustard. Meat flavorings and tenderizers. Bouillon cubes. Hot sauce. Tabasco sauce. Marinades. Taco seasonings. Relishes. Fats and Oils  Butter, stick margarine, lard, shortening, ghee, and bacon fat. Coconut, palm kernel, or palm oils. Regular salad dressings. Other  Pickles and olives. Salted popcorn and pretzels. The items listed above may not be a complete list of foods and beverages to avoid. Contact your dietitian for more information.  Where can I find more information? National [employer], [employer], [employer]: travelstabloid.com This information is not intended to replace advice given to you by your health care provider. Make sure you discuss any questions you have with your health care provider. Document Released: 01/08/2011 Document Revised: 06/27/2015 Document Reviewed: 11/23/2012 Elsevier Interactive Patient Education  2017 [employer] Hypertension Hypertension, commonly called high blood pressure, is when the force of blood pumping through your arteries is too strong. Your arteries are the blood vessels that carry blood from your heart throughout your body. A blood pressure reading consists of a higher number over a lower number, such as 110/72. The higher number (systolic) is the pressure inside your arteries when your heart pumps. The lower number (diastolic) is the pressure inside your arteries when your heart relaxes. Ideally you want your blood pressure below 120/80. Hypertension forces your heart to work harder to pump blood. Your arteries may become narrow or stiff. Having untreated or uncontrolled hypertension can cause heart attack, stroke, kidney disease, and other problems. What  increases the risk? Some risk factors for high blood pressure are controllable. Others are not. Risk factors you cannot control include:  Race. You may be at higher risk if you are African American.  Age. Risk increases with age.  Gender. Men are at higher risk than women before age 80 years. After age 60, women are at higher risk than men. Risk factors you can control include:  Not getting enough exercise or physical activity.  Being overweight.  Getting too much fat, sugar, calories, or salt in your diet.  Drinking too much alcohol. What are the signs or symptoms? Hypertension does not usually cause signs or symptoms. Extremely high blood pressure (hypertensive crisis) may cause headache, anxiety, shortness of breath, and nosebleed. How is this diagnosed? To check if you have hypertension, your health care provider will measure your blood pressure while you are seated, with your arm held at the level of your heart. It should be measured at least twice using the same arm. Certain conditions can cause a difference in blood pressure between your right and left arms. A blood pressure reading that is higher than normal on one occasion does not mean that you need treatment. If it is not clear whether you have high blood pressure, you may be asked to return on a different day to have your blood pressure checked again. Or, you may be asked to monitor your blood pressure at home for 1 or more weeks. How is this treated? Treating high blood pressure includes making lifestyle changes and possibly taking medicine. Living a healthy lifestyle can help lower high blood pressure. You may need to change some of your habits. Lifestyle changes may include:  Following the DASH diet. This  diet is high in fruits, vegetables, and whole grains. It is low in salt, red meat, and added sugars.  Keep your sodium intake below 2,300 mg per day.  Getting at least 30-45 minutes of aerobic exercise at least 4 times  per week.  Losing weight if necessary.  Not smoking.  Limiting alcoholic beverages.  Learning ways to reduce stress. Your health care provider may prescribe medicine if lifestyle changes are not enough to get your blood pressure under control, and if one of the following is true:  You are 53-37 years of age and your systolic blood pressure is above 140.  You are 39 years of age or older, and your systolic blood pressure is above 150.  Your diastolic blood pressure is above 90.  You have diabetes, and your systolic blood pressure is over 140 or your diastolic blood pressure is over 90.  You have kidney disease and your blood pressure is above 140/90.  You have heart disease and your blood pressure is above 140/90. Your personal target blood pressure may vary depending on your medical conditions, your age, and other factors. Follow these instructions at home:  Have your blood pressure rechecked as directed by your health care provider.  Take medicines only as directed by your health care provider. Follow the directions carefully. Blood pressure medicines must be taken as prescribed. The medicine does not work as well when you skip doses. Skipping doses also puts you at risk for problems.  Do not smoke.  Monitor your blood pressure at home as directed by your health care provider. Contact a health care provider if:  You think you are having a reaction to medicines taken.  You have recurrent headaches or feel dizzy.  You have swelling in your ankles.  You have trouble with your vision. Get help right away if:  You develop a severe headache or confusion.  You have unusual weakness, numbness, or feel faint.  You have severe chest or abdominal pain.  You vomit repeatedly.  You have trouble breathing. This information is not intended to replace advice given to you by your health care provider. Make sure you discuss any questions you have with your health care  provider. Document Released: 01/19/2005 Document Revised: 06/27/2015 Document Reviewed: 11/11/2012 Elsevier Interactive Patient Education  2017 [employer] Inc[employer] Gout Gout is painful swelling that can occur in some of your joints. Gout is a type of arthritis. This condition is caused by having too much uric acid in your body. Uric acid is a chemical that forms when your body breaks down substances called purines. Purines are important for building body proteins. When your body has too much uric acid, sharp crystals can form and build up inside your joints. This causes pain and swelling. Gout attacks can happen quickly and be very painful (acute gout). Over time, the attacks can affect more joints and become more frequent (chronic gout). Gout can also cause uric acid to build up under your skin and inside your kidneys. What are the causes? This condition is caused by too much uric acid in your blood. This can occur because:  Your kidneys do not remove enough uric acid from your blood. This is the most common cause.  Your body makes too much uric acid. This can occur with some cancers and cancer treatments. It can also occur if your body is breaking down too many red blood cells (hemolytic anemia).  You eat too many foods that are high in purines. These foods include  organ meats and some seafood. Alcohol, especially beer, is also high in purines. A gout attack may be triggered by trauma or stress. What increases the risk? This condition is more likely to develop in people who:  Have a family history of gout.  Are female and middle-aged.  Are female and have gone through menopause.  Are obese.  Frequently drink alcohol, especially beer.  Are dehydrated.  Lose weight too quickly.  Have an organ transplant.  Have lead poisoning.  Take certain medicines, including aspirin, cyclosporine, diuretics, levodopa, and niacin.  Have kidney disease or psoriasis. What are the signs or symptoms? An  attack of acute gout happens quickly. It usually occurs in just one joint. The most common place is the big toe. Attacks often start at night. Other joints that may be affected include joints of the feet, ankle, knee, fingers, wrist, or elbow. Symptoms may include:  Severe pain.  Warmth.  Swelling.  Stiffness.  Tenderness. The affected joint may be very painful to touch.  Shiny, red, or purple skin.  Chills and fever. Chronic gout may cause symptoms more frequently. More joints may be involved. You may also have white or yellow lumps (tophi) on your hands or feet or in other areas near your joints. How is this diagnosed? This condition is diagnosed based on your symptoms, medical history, and physical exam. You may have tests, such as:  Blood tests to measure uric acid levels.  Removal of joint fluid with a needle (aspiration) to look for uric acid crystals.  X-rays to look for joint damage. How is this treated? Treatment for this condition has two phases: treating an acute attack and preventing future attacks. Acute gout treatment may include medicines to reduce pain and swelling, including:  NSAIDs.  Steroids. These are strong anti-inflammatory medicines that can be taken by mouth (orally) or injected into a joint.  Colchicine. This medicine relieves pain and swelling when it is taken soon after an attack. It can be given orally or through an IV tube. Preventive treatment may include:  Daily use of smaller doses of NSAIDs or colchicine.  Use of a medicine that reduces uric acid levels in your blood.  Changes to your diet. You may need to see a specialist about healthy eating (dietitian). Follow these instructions at home: During a Gout Attack  If directed, apply ice to the affected area:  Put ice in a plastic bag.  Place a towel between your skin and the bag.  Leave the ice on for 20 minutes, 2-3 times a day.  Rest the joint as much as possible. If the affected  joint is in your leg, you may be given crutches to use.  Raise (elevate) the affected joint above the level of your heart as often as possible.  Drink enough fluids to keep your urine clear or pale yellow.  Take over-the-counter and prescription medicines only as told by your health care provider.  Do not drive or operate heavy machinery while taking prescription pain medicine.  Follow instructions from your health care provider about eating or drinking restrictions.  Return to your normal activities as told by your health care provider. Ask your health care provider what activities are safe for you. Avoiding Future Gout Attacks  Follow a low-purine diet as told by your dietitian or health care provider. Avoid foods and drinks that are high in purines, including liver, kidney, anchovies, asparagus, herring, mushrooms, mussels, and beer.  Limit alcohol intake to no more than 1  drink a day for nonpregnant women and 2 drinks a day for men. One drink equals 12 oz of beer, 5 oz of wine, or 1 oz of hard liquor.  Maintain a healthy weight or lose weight if you are overweight. If you want to lose weight, talk with your health care provider. It is important that you do not lose weight too quickly.  Start or maintain an exercise program as told by your health care provider.  Drink enough fluids to keep your urine clear or pale yellow.  Take over-the-counter and prescription medicines only as told by your health care provider.  Keep all follow-up visits as told by your health care provider. This is important. Contact a health care provider if:  You have another gout attack.  You continue to have symptoms of a gout attack after10 days of treatment.  You have side effects from your medicines.  You have chills or a fever.  You have burning pain when you urinate.  You have pain in your lower back or belly. Get help right away if:  You have severe or uncontrolled pain.  You cannot  urinate. This information is not intended to replace advice given to you by your health care provider. Make sure you discuss any questions you have with your health care provider. Document Released: 01/17/2000 Document Revised: 06/27/2015 Document Reviewed: 11/01/2014 Elsevier Interactive Patient Education  2017 [employer] Inc. Blood Glucose Monitoring, Adult Monitoring your blood sugar (glucose) helps you manage your diabetes. It also helps you and your health care provider determine how well your diabetes management plan is working. Blood glucose monitoring involves checking your blood glucose as often as directed, and keeping a record (log) of your results over time. Why should I monitor my blood glucose? Checking your blood glucose regularly can:  Help you understand how food, exercise, illnesses, and medicines affect your blood glucose.  Let you know what your blood glucose is at any time. You can quickly tell if you are having low blood glucose (hypoglycemia) or high blood glucose (hyperglycemia).  Help you and your health care provider adjust your medicines as needed. When should I check my blood glucose? Follow instructions from your health care provider about how often to check your blood glucose. This may depend on:  The type of diabetes you have.  How well-controlled your diabetes is.  Medicines you are taking. If you have type 1 diabetes:  Check your blood glucose at least 2 times a day.  Also check your blood glucose:  Before every insulin injection.  Before and after exercise.  Between meals.  2 hours after a meal.  Occasionally between 2:00 a.m. and 3:00 a.m., as directed.  Before potentially dangerous tasks, like driving or using heavy machinery.  At bedtime.  You may need to check your blood glucose more often, up to 6-10 times a day:  If you use an insulin pump.  If you need multiple daily injections (MDI).  If your diabetes is not  well-controlled.  If you are ill.  If you have a history of severe hypoglycemia.  If you have a history of not knowing when your blood glucose is getting low (hypoglycemia unawareness). If you have type 2 diabetes:  If you take insulin or other diabetes medicines, check your blood glucose at least 2 times a day.  If you are on intensive insulin therapy, check your blood glucose at least 4 times a day. Occasionally, you may also need to check between 2:00 a.m.  and 3:00 a.m., as directed.  Also check your blood glucose:  Before and after exercise.  Before potentially dangerous tasks, like driving or using heavy machinery.  You may need to check your blood glucose more often if:  Your medicine is being adjusted.  Your diabetes is not well-controlled.  You are ill. What is a blood glucose log?  A blood glucose log is a record of your blood glucose readings. It helps you and your health care provider:  Look for patterns in your blood glucose over time.  Adjust your diabetes management plan as needed.  Every time you check your blood glucose, write down your result and notes about things that may be affecting your blood glucose, such as your diet and exercise for the day.  Most glucose meters store a record of glucose readings in the meter. Some meters allow you to download your records to a computer. How do I check my blood glucose? Follow these steps to get accurate readings of your blood glucose: Supplies needed   Blood glucose meter.  Test strips for your meter. Each meter has its own strips. You must use the strips that come with your meter.  A needle to prick your finger (lancet). Do not use lancets more than once.  A device that holds the lancet (lancing device).  A journal or log book to write down your results. Procedure  Wash your hands with soap and water.  Prick the side of your finger (not the tip) with the lancet. Use a different finger each  time.  Gently rub the finger until a small drop of blood appears.  Follow instructions that come with your meter for inserting the test strip, applying blood to the strip, and using your blood glucose meter.  Write down your result and any notes. Alternative testing sites  Some meters allow you to use areas of your body other than your finger (alternative sites) to test your blood.  If you think you may have hypoglycemia, or if you have hypoglycemia unawareness, do not use alternative sites. Use your finger instead.  Alternative sites may not be as accurate as the fingers, because blood flow is slower in these areas. This means that the result you get may be delayed, and it may be different from the result that you would get from your finger.  The most common alternative sites are:  Forearm.  Thigh.  Palm of the hand. Additional tips  Always keep your supplies with you.  If you have questions or need help, all blood glucose meters have a 24-hour "hotline" number that you can call. You may also contact your health care provider.  After you use a few boxes of test strips, adjust (calibrate) your blood glucose meter by following instructions that came with your meter. This information is not intended to replace advice given to you by your health care provider. Make sure you discuss any questions you have with your health care provider. Document Released: 01/22/2003 Document Revised: 08/09/2015 Document Reviewed: 07/01/2015 Elsevier Interactive Patient Education  2017 [employer]

## 2016-01-16 NOTE — Telephone Encounter (Signed)
Called [phone] back no answer. Spoke with Dr. Otelia SergeantNitka he advised ok to stay on Xarelto.  Thanks.

## 2016-01-16 NOTE — Progress Notes (Addendum)
Patient's history reviewed.  Patient verbalized understanding of checking blood sugar when she wakes up and then every two hours after that until she gets to the short stay area.  .  Patient is taking xarelto at home for DVT prophylaxis after a knee surgery back in August.  There are no instructions from Dr. Barbaraann FasterNitka's office about what the patient needs to do about the xarelto.  Spoke with anesthesia about it and they instructed that the patient needs to attempt to call surgeons office to discuss this matter.  Patient was called and instructed to call the office with this question.  I attempted to call Dr. Barbaraann FasterNitka's office myself twice.  No answer either time.  Left message for them to call back with instructions or notify the patient with the instructions.

## 2016-01-16 NOTE — Progress Notes (Signed)
Stacy [name], is a 55 y.o. female  UJW:[id number]  FAO:[id number]  DOB - 1960-06-08  Chief Complaint  Patient presents with  . Diabetes       Subjective:   Stacy [name] is a 55 y.o. female  with history of hypertension, GERD, gouty arthritis, bilateral primary osteoarthritis of the knees, type 2 diabetes mellitus controlled, hyperlipidemia, COPD with ongoing tobacco use and chronic back pain here today for a follow up visit of DM and HTN. She continue to have pain in her right knee since surgery, she has had a closed manipulation ones to treat the possible scar tissues causing pain and stiffness. She is scheduled tomorrow to have closed manipulation of the right knee a second time to try and improve the motion. Otherwise she is doing ok. BS and BP are well controlled. She is adherent to medications and diet. No able to exercise because of pain. Patient has No headache, No chest pain, No abdominal pain - No Nausea, No new weakness tingling or numbness, No Cough - SOB.  No problems updated.  ALLERGIES: Allergies  Allergen Reactions  . Other Shortness Of Breath    UNSPECIFIED AGENTS Allergic to perfumes and cleaning products  . Ace Inhibitors Other (See Comments) and Cough       . Aspirin Nausea Only    unknown    PAST MEDICAL HISTORY: Past Medical History:  Diagnosis Date  . Arthritis   . 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 Asthma   . COPD (chronic obstructive pulmonary disease) (HCC)   . Diabetes mellitus    Type II  . GERD (gastroesophageal reflux disease)    Pt on Protonix daily  . Gout   . Headache(784.0)    otc meds prn  . Hyperlipidemia   . Hypertension    Pt on lisinopril  . Irritable bowel syndrome 11/19/2010  . Neuropathy (HCC)   . Shortness of breath    occasional - uses breathing tx at home    MEDICATIONS AT HOME: Prior to Admission medications   Medication Sig Start Date End Date Taking? Authorizing Provider  albuterol  (PROVENTIL) (2.5 MG/3ML) 0.083% nebulizer solution USE 1 VIAL VIA NEBULIZER EVERY 4 HOURS AS NEEDED FOR WHEEZING 11/01/15  Yes Quentin Angst, MD  allopurinol (ZYLOPRIM) 100 MG tablet Take 1 tablet (100 mg total) by mouth daily. 01/16/16  Yes Quentin Angst, MD  Blood Glucose Monitoring Suppl (TRUE METRIX METER) DEVI 1 Device by Does not apply route 4 (four) times daily -  before meals and at bedtime. 11/14/14  Yes Jaclyn Shaggy, MD  Colchicine 0.6 MG CAPS Take 1 capsule by mouth daily. 01/16/16  Yes Quentin Angst, MD  EPINEPHrine 0.3 mg/0.3 mL IJ SOAJ injection Inject 0.3 mLs (0.3 mg total) into the muscle once. Patient taking differently: Inject 0.3 mg into the muscle daily as needed (for allergic reaction).  09/10/14  Yes Quentin Angst, MD  gabapentin (NEURONTIN) 300 MG capsule Take 1 capsule (300 mg total) by mouth 3 (three) times daily. 01/16/16  Yes Quentin Angst, MD  glucose blood (ACCU-CHEK AVIVA) test strip Use as instructed Patient taking differently: 1 each by Other route 4 (four) times daily. Use as instructed 02/22/15  Yes Daniel Johndrow E Hyman Hopes, MD  glucose blood (TRUE METRIX BLOOD GLUCOSE TEST) test strip USE AS DIRECTED BY PHYSICIAN 07/31/14  Yes Quentin Angst, MD  hydrochlorothiazide (HYDRODIURIL) 25 MG tablet Take 1 tablet (25 mg total) by mouth  daily. 01/16/16  Yes Quentin Angstlugbemiga E Jaquelyne Firkus, MD  hydrocortisone-pramoxine [facility](ANALPRAM-HC) 2.5-1 % rectal cream Place 1 application rectally 2 (two) times daily. Patient taking differently: Place 1 application rectally 2 (two) times daily as needed for hemorrhoids.  11/16/14  Yes Jaclyn ShaggyEnobong Amao, MD  hydrOXYzine (ATARAX/VISTARIL) 10 MG tablet TAKE 1 TABLET BY MOUTH THREE TIMES DAILY AS NEEDED FOR ITCHING 01/16/16  Yes Damante Spragg E Hyman HopesJegede, MD  Lancets (ACCU-CHEK SOFT TOUCH) lancets Use as instructed Patient taking differently: 1 each by Other route 4 (four) times daily. Use as instructed 02/22/15  Yes Quentin Angstlugbemiga E Danford Tat, MD    metFORMIN (GLUCOPHAGE-XR) 750 MG 24 hr tablet Take 1 tablet (750 mg total) by mouth daily with breakfast. 01/16/16  Yes Quentin Angstlugbemiga E Takya Vandivier, MD  methocarbamol (ROBAXIN) 500 MG tablet Take 1 tablet (500 mg total) by mouth every 6 (six) hours as needed for muscle spasms. 01/16/16  Yes Quentin Angstlugbemiga E Amani Nodarse, MD  mometasone-formoterol (DULERA) 100-5 MCG/ACT AERO Inhale 2 puffs into the lungs 2 (two) times daily. 01/16/16  Yes Quentin Angstlugbemiga E Kanani Mowbray, MD  Multiple Vitamin (MULTIVITAMIN WITH MINERALS) TABS tablet Take 1 tablet by mouth daily.   Yes Historical Provider, MD  pantoprazole (PROTONIX) 40 MG tablet Take 1 tablet (40 mg total) by mouth daily. 01/16/16  Yes Quentin Angstlugbemiga E Ashleyann Shoun, MD  rivaroxaban (XARELTO) 10 MG TABS tablet Take 1 tablet (10 mg total) by mouth daily with breakfast. 01/16/16  Yes Quentin Angstlugbemiga E Savina Olshefski, MD  simvastatin (ZOCOR) 20 MG tablet Take 1 tablet (20 mg total) by mouth at bedtime. 01/16/16  Yes Quentin Angstlugbemiga E Radie Berges, MD  acetaminophen-codeine (TYLENOL #3) 300-30 MG tablet Take 1 tablet by mouth every 4 (four) hours as needed. 01/16/16   Quentin Angstlugbemiga E Laysa Kimmey, MD    Objective:   Vitals:   01/16/16 1004  BP: 111/69  Pulse: 77  Resp: 18  Temp: 98.9 F (37.2 C)  TempSrc: Oral  SpO2: 96%  Weight: 154 lb (69.9 kg)  Height: 5\' 6"  (1.676 m)   Exam General appearance : Awake, alert, not in any distress. Speech Clear. Not toxic looking HEENT: Atraumatic and Normocephalic, pupils equally reactive to light and accomodation Neck: Supple, no JVD. No cervical lymphadenopathy.  Chest: Good air entry bilaterally, no added sounds  CVS: S1 S2 regular, no murmurs.  Abdomen: Bowel sounds present, Non tender and not distended with no gaurding, rigidity or rebound. Extremities: Knee replacement surgical scar on the right, limited range of motion especially flexion. B/L Lower Ext shows no edema, both legs are warm to touch Neurology: Awake alert, and oriented X 3, CN II-XII intact, Non  focal Skin: No Rash  Data Review Lab Results  Component Value Date   HGBA1C 6.3 01/16/2016   HGBA1C 6.8 (H) 09/06/2015   HGBA1C 6.5 (H) 08/30/2015    Assessment & Plan   1. Type 2 diabetes mellitus without complication, without long-term current use of insulin (HCC)  - Glucose (CBG) - POCT A1C - Microalbumin/Creatinine Ratio, Urine - simvastatin (ZOCOR) 20 MG tablet; Take 1 tablet (20 mg total) by mouth at bedtime.  Dispense: 90 tablet; Refill: 3 - metFORMIN (GLUCOPHAGE-XR) 750 MG 24 hr tablet; Take 1 tablet (750 mg total) by mouth daily with breakfast.  Dispense: 90 tablet; Refill: 3 - hydrOXYzine (ATARAX/VISTARIL) 10 MG tablet; TAKE 1 TABLET BY MOUTH THREE TIMES DAILY AS NEEDED FOR ITCHING  Dispense: 30 tablet; Refill: 2 - gabapentin (NEURONTIN) 300 MG capsule; Take 1 capsule (300 mg total) by mouth 3 (three) times daily.  Dispense:  270 capsule; Refill: 3  Aim for 30 minutes of exercise most days. Rethink what you drink. Water is great! Aim for 2-3 Carb Choices per meal (30-45 grams) +/- 1 either way  Aim for 0-15 Carbs per snack if hungry  Include protein in moderation with your meals and snacks  Consider reading food labels for Total Carbohydrate and Fat Grams of foods  Consider checking BG at alternate times per day  Continue taking medication as directed Be mindful about how much sugar you are adding to beverages and other foods. Fruit Punch - find one with no sugar  Measure and decrease portions of carbohydrate foods  Make your plate and don't go back for seconds  2. Essential hypertension  - hydrochlorothiazide (HYDRODIURIL) 25 MG tablet; Take 1 tablet (25 mg total) by mouth daily.  Dispense: 90 tablet; Refill: 3  We have discussed target BP range and blood pressure goal. I have advised patient to check BP regularly and to call us back or report to clinic if the numbers are consistently higher than 140/90. We discussed the importance of compliance with medical  therapy and DASH diet recommended, consequences of uncontrolled hypertension discussed.  - continue current BP medications  3. Gastroesophageal reflux disease without esophagitis  - pantoprazole (PROTONIX) 40 MG tablet; Take 1 tablet (40 mg total) by mouth daily.  Dispense: 90 tablet; Refill: 3  4. COPD exacerbation (HCC)  - mometasone-formoterol (DULERA) 100-5 MCG/ACT AERO; Inhale 2 puffs into the lungs 2 (two) times daily.  Dispense: 1 Inhaler; Refill: 3  Stacy [name] was counseled on the dangers of tobacco use, and was advised to quit. Reviewed strategies to maximize success, including removing cigarettes and smoking materials from environment, stress management and support of family/friends.  5. Chronic bilateral low back pain without sciatica  - methocarbamol (ROBAXIN) 500 MG tablet; Take 1 tablet (500 mg total) by mouth every 6 (six) hours as needed for muscle spasms.  Dispense: 60 tablet; Refill: 3  - acetaminophen-codeine (TYLENOL #3) 300-30 MG tablet; Take 1 tablet by mouth every 4 (four) hours as needed.  Dispense: 90 tablet; Refill: 0  6. Gout of big toe  - rivaroxaban (XARELTO) 10 MG TABS tablet; Take 1 tablet (10 mg total) by mouth daily with breakfast.  Dispense: 30 tablet; Refill: 0 to prevent DVT while recovering from knee replacement - Colchicine 0.6 MG CAPS; Take 1 capsule by mouth daily.  Dispense: 90 capsule; Refill: 3 - allopurinol (ZYLOPRIM) 100 MG tablet; Take 1 tablet (100 mg total) by mouth daily.  Dispense: 90 tablet; Refill: 3  Patient have been counseled extensively about nutrition and exercise. Other issues discussed during this visit include: low cholesterol diet, weight control and daily exercise, foot care, annual eye examinations at Ophthalmology, importance of adherence with medications and regular follow-up. We also discussed long term complications of uncontrolled diabetes and hypertension.   Return in about 3 months (around 04/15/2016) for Hemoglobin A1C and  Follow up, DM, Follow up HTN, Follow up Pain and comorbidities.  The patient was given clear instructions to go to ER or return to medical center if symptoms don't improve, worsen or new problems develop. The patient verbalized understanding. The patient was told to call to get lab results if they haven't heard anything in the next week.   This note has been created with Education officer, environmental. Any transcriptional errors are unintentional.    Greidy Sherard, MD, MHA, FACP, FAAP, CPE [facility]  [address], [place] [phone]   01/16/2016, 10:54 AM

## 2016-01-17 ENCOUNTER — Encounter (HOSPITAL_COMMUNITY): Payer: Self-pay | Admitting: *Deleted

## 2016-01-17 ENCOUNTER — Ambulatory Visit (HOSPITAL_COMMUNITY): Payer: Medicaid Other | Admitting: Anesthesiology

## 2016-01-17 ENCOUNTER — Encounter (HOSPITAL_COMMUNITY)
Admission: RE | Disposition: A | Discharge status: Home / Self Care | Payer: Self-pay | Source: Ambulatory Visit | Attending: Specialist

## 2016-01-17 ENCOUNTER — Ambulatory Visit (HOSPITAL_COMMUNITY)
Admission: RE | Admit: 2016-01-17 | Discharge: 2016-01-18 | Disposition: A | Discharge status: Home / Self Care | Payer: Medicaid Other | Source: Ambulatory Visit | Attending: Specialist | Admitting: Specialist

## 2016-01-17 DIAGNOSIS — J449 Chronic obstructive pulmonary disease, unspecified: Secondary | ICD-10-CM | POA: Diagnosis not present

## 2016-01-17 DIAGNOSIS — M24661 Ankylosis, right knee: Secondary | ICD-10-CM

## 2016-01-17 DIAGNOSIS — M769 Unspecified enthesopathy, lower limb, excluding foot: Secondary | ICD-10-CM | POA: Insufficient documentation

## 2016-01-17 DIAGNOSIS — Z96651 Presence of right artificial knee joint: Secondary | ICD-10-CM

## 2016-01-17 DIAGNOSIS — Z7982 Long term (current) use of aspirin: Secondary | ICD-10-CM | POA: Insufficient documentation

## 2016-01-17 DIAGNOSIS — E785 Hyperlipidemia, unspecified: Secondary | ICD-10-CM | POA: Insufficient documentation

## 2016-01-17 DIAGNOSIS — M76891 Other specified enthesopathies of right lower limb, excluding foot: Secondary | ICD-10-CM | POA: Diagnosis not present

## 2016-01-17 DIAGNOSIS — M25661 Stiffness of right knee, not elsewhere classified: Secondary | ICD-10-CM | POA: Diagnosis not present

## 2016-01-17 DIAGNOSIS — F17210 Nicotine dependence, cigarettes, uncomplicated: Secondary | ICD-10-CM | POA: Diagnosis not present

## 2016-01-17 DIAGNOSIS — E1140 Type 2 diabetes mellitus with diabetic neuropathy, unspecified: Secondary | ICD-10-CM | POA: Diagnosis not present

## 2016-01-17 DIAGNOSIS — K219 Gastro-esophageal reflux disease without esophagitis: Secondary | ICD-10-CM | POA: Diagnosis not present

## 2016-01-17 DIAGNOSIS — I10 Essential (primary) hypertension: Secondary | ICD-10-CM | POA: Insufficient documentation

## 2016-01-17 HISTORY — PX: KNEE CLOSED REDUCTION: SHX995

## 2016-01-17 LAB — BASIC METABOLIC PANEL
Anion gap: 10 (ref 5–15)
BUN: 8 mg/dL (ref 6–20)
CO2: 29 mmol/L (ref 22–32)
Calcium: 9.7 mg/dL (ref 8.9–10.3)
Chloride: 100 mmol/L — ABNORMAL LOW (ref 101–111)
Creatinine, Ser: 0.81 mg/dL (ref 0.44–1.00)
GFR calc Af Amer: >60 mL/min (ref >60)
GFR calc non Af Amer: >60 mL/min (ref >60)
Glucose, Bld: 129 mg/dL — ABNORMAL HIGH (ref 65–99)
Potassium: 3 mmol/L — ABNORMAL LOW (ref 3.5–5.1)
Sodium: 139 mmol/L (ref 135–145)

## 2016-01-17 LAB — PROTIME-INR
INR: 0.95
Prothrombin Time: 12.7 seconds (ref 11.4–15.2)

## 2016-01-17 LAB — CBC
HCT: 37.1 % (ref 36.0–46.0)
Hemoglobin: 12.4 g/dL (ref 12.0–15.0)
MCH: 26.4 pg (ref 26.0–34.0)
MCHC: 33.4 g/dL (ref 30.0–36.0)
MCV: 79.1 fL (ref 78.0–100.0)
Platelets: 268 10*3/uL (ref 150–400)
RBC: 4.69 MIL/uL (ref 3.87–5.11)
RDW: 16.5 % — ABNORMAL HIGH (ref 11.5–15.5)
WBC: 6.1 10*3/uL (ref 4.0–10.5)

## 2016-01-17 LAB — GLUCOSE, CAPILLARY
Glucose-Capillary: 125 mg/dL — ABNORMAL HIGH (ref 65–99)
Glucose-Capillary: 101 mg/dL — ABNORMAL HIGH (ref 65–99)
Glucose-Capillary: 164 mg/dL — ABNORMAL HIGH (ref 65–99)

## 2016-01-17 SURGERY — MANIPULATION, KNEE, CLOSED
Anesthesia: General | Laterality: Right

## 2016-01-17 MED ORDER — ONDANSETRON HCL 4 MG/2ML IJ SOLN: 4.0000 mg | Freq: Four times a day (QID) | INTRAMUSCULAR | Status: DC | PRN

## 2016-01-17 MED ORDER — FLEET ENEMA 7-19 GM/118ML RE ENEM: 1.0000 | ENEMA | Freq: Once | RECTAL | Status: DC | PRN

## 2016-01-17 MED ORDER — METFORMIN HCL ER 500 MG PO TB24: 750.0000 mg | ORAL_TABLET | Freq: Every day | ORAL | Status: DC

## 2016-01-17 MED ORDER — HYDROCORTISONE 2.5 % RE CREA
TOPICAL_CREAM | Freq: Two times a day (BID) | RECTAL | Status: DC
  Filled 2016-01-17: qty 28.35

## 2016-01-17 MED ORDER — ONDANSETRON HCL 4 MG/2ML IJ SOLN
INTRAMUSCULAR | Status: AC
  Filled 2016-01-17: qty 2

## 2016-01-17 MED ORDER — PROPOFOL 10 MG/ML IV BOLUS
INTRAVENOUS | Status: DC | PRN
  Administered 2016-01-17: 150 mg via INTRAVENOUS

## 2016-01-17 MED ORDER — ALBUTEROL SULFATE (2.5 MG/3ML) 0.083% IN NEBU
2.5000 mg | INHALATION_SOLUTION | RESPIRATORY_TRACT | Status: DC
  Administered 2016-01-17: 2.5 mg via RESPIRATORY_TRACT
  Filled 2016-01-17: qty 3

## 2016-01-17 MED ORDER — ACETAMINOPHEN 650 MG RE SUPP: 650.0000 mg | Freq: Four times a day (QID) | RECTAL | Status: DC | PRN

## 2016-01-17 MED ORDER — HYDROCORTISONE ACE-PRAMOXINE 2.5-1 % RE CREA
1.0000 "application " | TOPICAL_CREAM | Freq: Two times a day (BID) | RECTAL | Status: DC
  Administered 2016-01-17: 1 via RECTAL
  Filled 2016-01-17 (×2): qty 30

## 2016-01-17 MED ORDER — OXYCODONE HCL 5 MG PO TABS: 5.0000 mg | ORAL_TABLET | Freq: Once | ORAL | 0 refills | Status: DC | PRN

## 2016-01-17 MED ORDER — OXYCODONE HCL 5 MG PO TABS
ORAL_TABLET | ORAL | Status: AC
  Filled 2016-01-17: qty 1

## 2016-01-17 MED ORDER — ONDANSETRON HCL 4 MG/2ML IJ SOLN
4.0000 mg | Freq: Once | INTRAMUSCULAR | Status: AC | PRN
  Administered 2016-01-17: 4 mg via INTRAVENOUS

## 2016-01-17 MED ORDER — MIDAZOLAM HCL 2 MG/2ML IJ SOLN
INTRAMUSCULAR | Status: AC
  Filled 2016-01-17: qty 2

## 2016-01-17 MED ORDER — COLCHICINE 0.6 MG PO TABS
0.6000 mg | ORAL_TABLET | Freq: Every day | ORAL | Status: DC
  Administered 2016-01-18: 0.6 mg via ORAL
  Filled 2016-01-17: qty 1

## 2016-01-17 MED ORDER — METOCLOPRAMIDE HCL 5 MG/ML IJ SOLN: 5.0000 mg | Freq: Three times a day (TID) | INTRAMUSCULAR | Status: DC | PRN

## 2016-01-17 MED ORDER — DOCUSATE SODIUM 100 MG PO CAPS
100.0000 mg | ORAL_CAPSULE | Freq: Two times a day (BID) | ORAL | Status: DC
  Administered 2016-01-17 – 2016-01-18 (×2): 100 mg via ORAL
  Filled 2016-01-17 (×2): qty 1

## 2016-01-17 MED ORDER — METHOCARBAMOL 1000 MG/10ML IJ SOLN
500.0000 mg | Freq: Four times a day (QID) | INTRAMUSCULAR | Status: DC | PRN
  Filled 2016-01-17: qty 5

## 2016-01-17 MED ORDER — HYDROXYZINE HCL 10 MG PO TABS
10.0000 mg | ORAL_TABLET | Freq: Four times a day (QID) | ORAL | Status: DC | PRN
  Filled 2016-01-17: qty 1

## 2016-01-17 MED ORDER — MOMETASONE FURO-FORMOTEROL FUM 100-5 MCG/ACT IN AERO
2.0000 | INHALATION_SPRAY | Freq: Two times a day (BID) | RESPIRATORY_TRACT | Status: DC
  Administered 2016-01-17 – 2016-01-18 (×2): 2 via RESPIRATORY_TRACT
  Filled 2016-01-17: qty 8.8

## 2016-01-17 MED ORDER — FENTANYL CITRATE (PF) 100 MCG/2ML IJ SOLN: 25.0000 ug | INTRAMUSCULAR | Status: DC | PRN

## 2016-01-17 MED ORDER — MIDAZOLAM HCL 5 MG/5ML IJ SOLN
INTRAMUSCULAR | Status: DC | PRN
  Administered 2016-01-17: 1 mg via INTRAVENOUS

## 2016-01-17 MED ORDER — FENTANYL CITRATE (PF) 100 MCG/2ML IJ SOLN
INTRAMUSCULAR | Status: AC
  Filled 2016-01-17: qty 2

## 2016-01-17 MED ORDER — SIMVASTATIN 20 MG PO TABS
20.0000 mg | ORAL_TABLET | Freq: Every day | ORAL | Status: DC
  Administered 2016-01-17: 20 mg via ORAL
  Filled 2016-01-17: qty 1

## 2016-01-17 MED ORDER — POLYETHYLENE GLYCOL 3350 17 G PO PACK: 17.0000 g | PACK | Freq: Every day | ORAL | Status: DC | PRN

## 2016-01-17 MED ORDER — LIDOCAINE HCL (CARDIAC) 20 MG/ML IV SOLN
INTRAVENOUS | Status: DC | PRN
  Administered 2016-01-17: 30 mg via INTRAVENOUS

## 2016-01-17 MED ORDER — FENTANYL CITRATE (PF) 100 MCG/2ML IJ SOLN
INTRAMUSCULAR | Status: AC
  Filled 2016-01-17: qty 4

## 2016-01-17 MED ORDER — ADULT MULTIVITAMIN W/MINERALS CH
1.0000 | ORAL_TABLET | Freq: Every day | ORAL | Status: DC
  Administered 2016-01-17 – 2016-01-18 (×2): 1 via ORAL
  Filled 2016-01-17 (×2): qty 1

## 2016-01-17 MED ORDER — METHOCARBAMOL 500 MG PO TABS: 500.0000 mg | ORAL_TABLET | Freq: Four times a day (QID) | ORAL | Status: DC | PRN

## 2016-01-17 MED ORDER — PANTOPRAZOLE SODIUM 40 MG PO TBEC
40.0000 mg | DELAYED_RELEASE_TABLET | Freq: Every day | ORAL | Status: DC
  Administered 2016-01-18: 40 mg via ORAL
  Filled 2016-01-17: qty 1

## 2016-01-17 MED ORDER — PROPOFOL 10 MG/ML IV BOLUS
INTRAVENOUS | Status: AC
  Filled 2016-01-17: qty 20

## 2016-01-17 MED ORDER — MORPHINE SULFATE (PF) 2 MG/ML IV SOLN: 2.0000 mg | INTRAVENOUS | Status: DC | PRN

## 2016-01-17 MED ORDER — BISACODYL 5 MG PO TBEC: 5.0000 mg | DELAYED_RELEASE_TABLET | Freq: Every day | ORAL | Status: DC | PRN

## 2016-01-17 MED ORDER — OXYCODONE HCL 5 MG PO TABS
5.0000 mg | ORAL_TABLET | ORAL | Status: DC | PRN
  Administered 2016-01-17 – 2016-01-18 (×2): 10 mg via ORAL
  Filled 2016-01-17 (×2): qty 2

## 2016-01-17 MED ORDER — ONDANSETRON HCL 4 MG PO TABS
4.0000 mg | ORAL_TABLET | Freq: Four times a day (QID) | ORAL | Status: DC | PRN
Start: 2016-01-17 — End: 2016-01-18

## 2016-01-17 MED ORDER — HYDROCHLOROTHIAZIDE 25 MG PO TABS
25.0000 mg | ORAL_TABLET | Freq: Every day | ORAL | Status: DC
  Administered 2016-01-18: 25 mg via ORAL
  Filled 2016-01-17: qty 1

## 2016-01-17 MED ORDER — INSULIN ASPART 100 UNIT/ML ~~LOC~~ SOLN: 0.0000 [IU] | Freq: Three times a day (TID) | SUBCUTANEOUS | Status: DC

## 2016-01-17 MED ORDER — METHOCARBAMOL 500 MG PO TABS
500.0000 mg | ORAL_TABLET | Freq: Four times a day (QID) | ORAL | Status: DC | PRN
  Administered 2016-01-17: 500 mg via ORAL
  Filled 2016-01-17: qty 1

## 2016-01-17 MED ORDER — HYDROCODONE-ACETAMINOPHEN 7.5-325 MG PO TABS
1.0000 | ORAL_TABLET | Freq: Four times a day (QID) | ORAL | Status: DC
  Administered 2016-01-17 – 2016-01-18 (×3): 1 via ORAL
  Filled 2016-01-17 (×3): qty 1

## 2016-01-17 MED ORDER — OXYCODONE HCL 5 MG PO TABS
5.0000 mg | ORAL_TABLET | Freq: Once | ORAL | Status: AC | PRN
  Administered 2016-01-17: 5 mg via ORAL

## 2016-01-17 MED ORDER — ACETAMINOPHEN 325 MG PO TABS: 650.0000 mg | ORAL_TABLET | Freq: Four times a day (QID) | ORAL | Status: DC | PRN

## 2016-01-17 MED ORDER — SODIUM CHLORIDE 0.9 % IV SOLN
INTRAVENOUS | Status: DC
  Administered 2016-01-17: 23:00:00 via INTRAVENOUS

## 2016-01-17 MED ORDER — LACTATED RINGERS IV SOLN
INTRAVENOUS | Status: DC
  Administered 2016-01-17 (×2): via INTRAVENOUS

## 2016-01-17 MED ORDER — LIDOCAINE 2% (20 MG/ML) 5 ML SYRINGE
INTRAMUSCULAR | Status: AC
  Filled 2016-01-17: qty 5

## 2016-01-17 MED ORDER — ALBUTEROL SULFATE (2.5 MG/3ML) 0.083% IN NEBU
2.5000 mg | INHALATION_SOLUTION | RESPIRATORY_TRACT | Status: DC | PRN
Start: 2016-01-17 — End: 2016-01-18

## 2016-01-17 MED ORDER — FENTANYL CITRATE (PF) 100 MCG/2ML IJ SOLN
INTRAMUSCULAR | Status: DC | PRN
  Administered 2016-01-17 (×2): 100 ug via INTRAVENOUS
  Administered 2016-01-17 (×2): 50 ug via INTRAVENOUS

## 2016-01-17 MED ORDER — OXYCODONE HCL 5 MG/5ML PO SOLN: 5.0000 mg | Freq: Once | ORAL | Status: AC | PRN

## 2016-01-17 MED ORDER — GABAPENTIN 300 MG PO CAPS
300.0000 mg | ORAL_CAPSULE | Freq: Three times a day (TID) | ORAL | Status: DC
  Administered 2016-01-17 – 2016-01-18 (×3): 300 mg via ORAL
  Filled 2016-01-17 (×3): qty 1

## 2016-01-17 MED ORDER — METOCLOPRAMIDE HCL 5 MG PO TABS: 5.0000 mg | ORAL_TABLET | Freq: Three times a day (TID) | ORAL | Status: DC | PRN

## 2016-01-17 MED ORDER — ALLOPURINOL 100 MG PO TABS
100.0000 mg | ORAL_TABLET | Freq: Every day | ORAL | Status: DC
  Administered 2016-01-18: 100 mg via ORAL
  Filled 2016-01-17: qty 1

## 2016-01-17 MED ORDER — RIVAROXABAN 10 MG PO TABS
10.0000 mg | ORAL_TABLET | Freq: Every day | ORAL | Status: DC
  Administered 2016-01-18: 10 mg via ORAL
  Filled 2016-01-17: qty 1

## 2016-01-17 NOTE — Progress Notes (Addendum)
Orthopedic Tech Progress Note Patient Details:  Stacy [name] [birth year] [id number]  CPM Right Knee CPM Right Knee: On Right Knee Flexion (Degrees): 95 Right Knee Extension (Degrees): 0  Ortho Devices Ortho Device/Splint Location: applied ohf to bed Ortho Device/Splint Interventions: Ordered, Application   Jennye MoccasinHughes, Sadako Cegielski Craig 01/17/2016, 3:43 PM Start time 1540

## 2016-01-17 NOTE — Transfer of Care (Signed)
Immediate Anesthesia Transfer of Care Note  Patient: Stacy [name]  Procedure(s) Performed: Procedure(s): CLOSED MANIPULATION RIGHT KNEE (Right)  Patient Location: PACU  Anesthesia Type:General  Level of Consciousness: awake, alert , oriented and patient cooperative  Airway & Oxygen Therapy: Patient Spontanous Breathing  Post-op Assessment: Report given to RN, Post -op Vital signs reviewed and stable and Patient moving all extremities  Post vital signs: Reviewed and stable  Last Vitals:  Vitals:   01/17/16 1008 01/17/16 1404  BP: (!) 157/81   Pulse: 79   Resp: 18   Temp: 37.3 C (P) 36.7 C    Last Pain:  Vitals:   01/17/16 1008  TempSrc: Oral      Patients Stated Pain Goal: 3 (01/17/16 1002)  Complications: No apparent anesthesia complications

## 2016-01-17 NOTE — Anesthesia Procedure Notes (Signed)
Procedure Name: LMA Insertion Date/Time: 01/17/2016 1:35 PM Performed by: Orlinda BlalockMCMILLEN, Harlowe Dowler L Pre-anesthesia Checklist: Patient identified, Emergency Drugs available, Suction available and Patient being monitored Patient Re-evaluated:Patient Re-evaluated prior to inductionOxygen Delivery Method: Circle System Utilized Preoxygenation: Pre-oxygenation with 100% oxygen Intubation Type: IV induction Ventilation: Mask ventilation without difficulty LMA: LMA inserted LMA Size: 4.0 Number of attempts: 1 Placement Confirmation: positive ETCO2 Tube secured with: Tape Dental Injury: Teeth and Oropharynx as per pre-operative assessment

## 2016-01-17 NOTE — Transfer of Care (Signed)
Immediate Anesthesia Transfer of Care Note  Patient: Stacy [name]  Procedure(s) Performed: Procedure(s): CLOSED MANIPULATION RIGHT KNEE (Right)  Patient Location: PACU  Anesthesia Type:General  Level of Consciousness: awake, alert , oriented and patient cooperative  Airway & Oxygen Therapy: Patient Spontanous Breathing  Post-op Assessment: Report given to RN, Post -op Vital signs reviewed and stable and Patient moving all extremities  Post vital signs: Reviewed and stable  Last Vitals:  Vitals:   01/17/16 1008  BP: (!) 157/81  Pulse: 79  Resp: 18  Temp: 37.3 C    Last Pain:  Vitals:   01/17/16 1008  TempSrc: Oral      Patients Stated Pain Goal: 3 (01/17/16 1002)  Complications: No apparent anesthesia complications

## 2016-01-17 NOTE — H&P (Signed)
Stacy [name] is an 55 y.o. female.   Chief Complaint:  Right knee stiffness HPI: patient with hx of postop right knee arthrofibrosis after TKR presents with above complaint.  Failed conservative treatment.    Past Medical History:  Diagnosis Date  . Arthritis   . 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 Asthma   . COPD (chronic obstructive pulmonary disease) (HCC)   . Diabetes mellitus    Type II  . GERD (gastroesophageal reflux disease)    Pt on Protonix daily  . Gout   . Headache(784.0)    otc meds prn  . Hyperlipidemia   . Hypertension    Pt on lisinopril  . Irritable bowel syndrome 11/19/2010  . Neuropathy (HCC)   . Shortness of breath    occasional - uses breathing tx at home    Past Surgical History:  Procedure Laterality Date  . CHOLECYSTECTOMY    . ENDOMETRIAL ABLATION  10/2010  . HERNIA REPAIR     umbicial hernia  . KNEE CLOSED REDUCTION Right 12/06/2015   Procedure: CLOSED MANIPULATION RIGHT KNEE;  Surgeon: Kerrin ChampagneJames E Nitka, MD;  Location: [facility] OR;  Service: Orthopedics;  Laterality: Right;  . KNEE JOINT MANIPULATION Right 12/06/2015  . LUMBAR DISC SURGERY  06/03/2015   L 2  L3 L4 L5   . LUMBAR LAMINECTOMY/DECOMPRESSION MICRODISCECTOMY N/A 06/03/2015   Procedure: Bilateral lateral recess decompression L2-3, L3-4, L4-5;  Surgeon: Kerrin ChampagneJames E Nitka, MD;  Location: [facility] OR;  Service: Orthopedics;  Laterality: N/A;  . svd      x 2  . TOTAL KNEE ARTHROPLASTY Right 09/06/2015   Procedure: RIGHT TOTAL KNEE ARTHROPLASTY;  Surgeon: Kerrin ChampagneJames E Nitka, MD;  Location: [facility] OR;  Service: Orthopedics;  Laterality: Right;  . TUBAL LIGATION    . UPPER GASTROINTESTINAL ENDOSCOPY  04/28/11    Family History  Problem Relation Age of Onset  . Hypertension Father   . Cancer Father   . Asthma Son     had as a child  . Heart disease Sister   . Breast cancer Sister   . Hypertension Brother    Social History:  reports that she has been smoking Cigarettes.  She has a 8.00  pack-year smoking history. She has never used smokeless tobacco. She reports that she does not drink alcohol or use drugs.  Allergies:  Allergies  Allergen Reactions  . Other Shortness Of Breath    UNSPECIFIED AGENTS Allergic to perfumes and cleaning products  . Ace Inhibitors Cough       . Aspirin Nausea Only    No prescriptions prior to admission.    Results for orders placed or performed in visit on 01/16/16 (from the past 48 hour(s))  POCT A1C     Status: None   Collection Time: 01/16/16 10:02 AM  Result Value Ref Range   Hemoglobin A1C 6.3   Glucose (CBG)     Status: Normal   Collection Time: 01/16/16 10:07 AM  Result Value Ref Range   POC Glucose 93 70 - 99 mg/dl   No results found.  Review of Systems  Constitutional: Negative.   HENT: Negative.   Respiratory: Negative.   Genitourinary: Negative.   Skin: Negative.   Neurological: Negative.   Psychiatric/Behavioral: Negative.     There were no vitals taken for this visit. Physical Exam  Constitutional: She is oriented to person, place, and time. No distress.  HENT:  Head: Normocephalic.  Eyes: Pupils are equal, round, and  reactive to light.  Neck: Normal range of motion.  Respiratory: No respiratory distress.  GI: She exhibits no distension.  Musculoskeletal:  Decreased knee range of motion.   Neurological: She is alert and oriented to person, place, and time.  Skin: Skin is warm and dry.  Psychiatric: She has a normal mood and affect.     Assessment/Plan Right knee arthrofibrosis  Will proceed with manipulation under anesthesia as scheduled.  Procedure discussed and all questions answered.    Zonia KiefJames Owens, PA-C 01/17/2016, 7:29 AM  Patient examined and lab reviewed with Barry Dieneswens, PA-C.

## 2016-01-17 NOTE — Anesthesia Preprocedure Evaluation (Addendum)
Anesthesia Evaluation  Patient identified by MRN, date of birth, ID band Patient awake    Reviewed: Allergy & Precautions, NPO status , Patient's Chart, lab work & pertinent test results  Airway Mallampati: II  TM Distance: >3 FB Neck ROM: Full    Dental  (+) Dental Advisory Given, Edentulous Upper   Pulmonary Current Smoker,    breath sounds clear to auscultation       Cardiovascular hypertension,  Rhythm:Regular     Neuro/Psych    GI/Hepatic   Endo/Other  diabetes  Renal/GU      Musculoskeletal   Abdominal   Peds  Hematology   Anesthesia Other Findings   Reproductive/Obstetrics                            Anesthesia Physical Anesthesia Plan  ASA: III  Anesthesia Plan: General   Post-op Pain Management:    Induction: Intravenous  Airway Management Planned: LMA  Additional Equipment:   Intra-op Plan:   Post-operative Plan:   Informed Consent: I have reviewed the patients History and Physical, chart, labs and discussed the procedure including the risks, benefits and alternatives for the proposed anesthesia with the patient or authorized representative who has indicated his/her understanding and acceptance.   Dental advisory given  Plan Discussed with: CRNA and Anesthesiologist  Anesthesia Plan Comments:         Anesthesia Quick Evaluation

## 2016-01-17 NOTE — Anesthesia Postprocedure Evaluation (Signed)
Anesthesia Post Note  Patient: Stacy [name]  Procedure(s) Performed: Procedure(s) (LRB): CLOSED MANIPULATION RIGHT KNEE (Right)  Patient location during evaluation: PACU Anesthesia Type: General Level of consciousness: awake, awake and alert and oriented Pain management: pain level controlled Vital Signs Assessment: post-procedure vital signs reviewed and stable Respiratory status: spontaneous breathing, nonlabored ventilation and respiratory function stable Cardiovascular status: blood pressure returned to baseline Anesthetic complications: no    Last Vitals:  Vitals:   01/17/16 1500 01/17/16 1522  BP: (!) 156/89 (!) 181/89  Pulse:  65  Resp:  18  Temp: 36.7 C 37.3 C    Last Pain:  Vitals:   01/17/16 1522  TempSrc: Oral  PainSc:                  Daymond Cordts COKER

## 2016-01-17 NOTE — Brief Op Note (Signed)
01/17/2016  1:50 PM  PATIENT:  Stacy LinPamela [name]  55 y.o. female  PRE-OPERATIVE DIAGNOSIS:  right knee adhesive capsulitis and arthrofibrosis  POST-OPERATIVE DIAGNOSIS:  right knee adhesive capsulitis and arthrofibrosis  PROCEDURE:  Procedure(s): CLOSED MANIPULATION RIGHT KNEE (Right)  SURGEON:  Surgeon(s) and Role:    * Kerrin ChampagneJames E Nitka, MD - Primary  PHYSICIAN ASSISTANT:None   ASSISTANTS: None  ANESTHESIA:   general, Dr. Noreene LarssonJoslin, oroesopgageal obturator  EBL:  No intake/output data recorded.  BLOOD ADMINISTERED:none  DRAINS: none   LOCAL MEDICATIONS USED:  NONE  SPECIMEN:  No Specimen  DISPOSITION OF SPECIMEN:  N/A  COUNTS:  YES  TOURNIQUET:  * No tourniquets in log *  DICTATION: .Dragon Dictation  PLAN OF CARE: Admit for overnight observation  PATIENT DISPOSITION:  PACU - hemodynamically stable.   Delay start of Pharmacological VTE agent (>24hrs) due to surgical blood loss or risk of bleeding: yes

## 2016-01-17 NOTE — Op Note (Signed)
     01/17/2016  1:54 PM PATIENT:  Stacy [name]  55 y.o. female  MRN: [id number]  OPERATIVE REPORT  PRE-OPERATIVE DIAGNOSIS:  Right knee adhesive capsulitis, right total knee arthrofibrosis  POST-OPERATIVE DIAGNOSIS:  Right knee adhesive capsulitis, right total knee arthrofibrosis  PROCEDURE:  Procedure(s): CLOSED MANIPULATION RIGHT KNEE total knee arthroplasty   SURGEON:  [name], MD     ASSISTANT:none  ANESTHESIA:  Maudry Mayhew oroesophageal obturator,Dr. Linna Caprice.  EBL: 0CC    COMPLICATIONS:  None.   PROCEDURE: The patient was met in the holding area, and the appropriate right knee identified and marked with an "X" and my initials.    The patient was then transported to OR and was placed on the operative table in a supine position. The patient was then placed under  general anesthesia via face mask without difficulty.Time-out procedure was called and correct  .  After patient was demonstrated to be fully relaxed and under general anesthetic the right lower extremity was first placed in full extension and demonstrated a 30 flexion contracture. First the extension of the patient's right knee was performed allowing the patient's heel to be against the OR table and applying direct pressure and then over the anterior aspect of the knee and proximal tibia and proximal femur. With gradual pressure extension of the knee was able to be obtained a knee initially placed out to 5 flexion. The knee then placed into flexion with the right hip flexed 90 the knee demonstrating only flexion the possible to about 70. Direct pressure and then applied to the proximal tibia and further flexion was then able to be obtained using gradual pressure to the anterior proximal tibia and across the entire anterior aspect of the tibia on the right side the knee was able be flexed further and gradually release of the soft tissue over these dorsal superior aspect of the patella was felt to occur.  With this the knee was then able to be flexed of further to 120. The knee then again brought into extension and a bolster using a Ace wrap placed under the right heel then allowed for further anterior pressure over the right knee until the knee was able to be placed and extension of about -5. This was held in place for several moments until the knee demonstrated easy extension tendencies to recurrence of flexion contracture. The knee then again flexed and found to be at about 120 flexion so that further anterior pressure applied to the right proximal area below the knee joint line and across the anterior aspect of the right tibia so that flexion was able to then be increased to approximately 130. This was also held in place then for several moments to allow for creep of the soft tissue over the anterior proximal knee joint line and the extensor mechanism. The knee then brought it into full extension and then flexion able be demonstrated to over 120.Circulating RN performed cell phone radiographs of both extension and flexion demonstrating the final range of motion of the right total knee arthroplasty with close manipulation. Patient was then reactivated extubated and returned to the recovery room in satisfactory condition no instruments were used during this procedure.     [name]  01/17/2016, 1:54 PM

## 2016-01-17 NOTE — Interval H&P Note (Signed)
History and Physical Interval Note:  01/17/2016 1:08 PM  Stacy [name]  has presented today for surgery, with the diagnosis of right knee adhesive capsulitis and arthrofibrosis  The various methods of treatment have been discussed with the patient and family. After consideration of risks, benefits and other options for treatment, the patient has consented to  Procedure(s): CLOSED MANIPULATION RIGHT KNEE (Right) as a surgical intervention .  The patient's history has been reviewed, patient examined, no change in status, stable for surgery.  I have reviewed the patient's chart and labs.  Questions were answered to the patient's satisfaction.     Kerrin ChampagneJames E Nitka

## 2016-01-17 NOTE — Progress Notes (Signed)
Pt admitted to the unit from pacu; pt A&O x4; IV intact and transfusing; pt oriented to the unit and room; VSS; fall/safety precaution and prevention education completed with pt; bed alarm on; family at bedside and call light within reach. Dionne BucyP. Amo Nicholson Starace RN

## 2016-01-18 ENCOUNTER — Encounter (HOSPITAL_COMMUNITY): Payer: Self-pay | Admitting: Specialist

## 2016-01-18 DIAGNOSIS — M769 Unspecified enthesopathy, lower limb, excluding foot: Secondary | ICD-10-CM | POA: Diagnosis not present

## 2016-01-18 LAB — GLUCOSE, CAPILLARY: Glucose-Capillary: 93 mg/dL (ref 65–99)

## 2016-01-18 MED ORDER — METHOCARBAMOL 500 MG PO TABS: 500.0000 mg | ORAL_TABLET | Freq: Four times a day (QID) | ORAL | 0 refills | Status: DC | PRN

## 2016-01-18 NOTE — Discharge Instructions (Signed)
° ° °  Call if fever or chills or increased drainage. Go to ER if acutely short of breath or call for ambulance. Return for follow up in 2 weeks. May full weight bear on the surgical leg unless told otherwise.Take your xarelto to prevent blood clots. Go to therapy for ROM exercises. Use ice to the right knee to decrease pain and swelling. Do not use a pillow under the right knee.  Information on my medicine - XARELTO (Rivaroxaban)  This medication education was reviewed with me or my healthcare representative as part of my discharge preparation.  The pharmacist that spoke with me during my hospital stay was:  Renaee MundaHiatt, Paislynn Hegstrom P, [facility]  Why was Xarelto prescribed for you? Xarelto was prescribed for you to reduce the risk of blood clots forming after orthopedic surgery. The medical term for these abnormal blood clots is venous thromboembolism (VTE).  What do you need to know about xarelto ? Take your Xarelto ONCE DAILY at the same time every day. You may take it either with or without food.  If you have difficulty swallowing the tablet whole, you may crush it and mix in applesauce just prior to taking your dose.  Take Xarelto exactly as prescribed by your doctor and DO NOT stop taking Xarelto without talking to the doctor who prescribed the medication.  Stopping without other VTE prevention medication to take the place of Xarelto may increase your risk of developing a clot.  After discharge, you should have regular check-up appointments with your healthcare provider that is prescribing your Xarelto.    What do you do if you miss a dose? If you miss a dose, take it as soon as you remember on the same day then continue your regularly scheduled once daily regimen the next day. Do not take two doses of Xarelto on the same day.   Important Safety Information A possible side effect of Xarelto is bleeding. You should call your healthcare provider right away if you experience any of the  following: ? Bleeding from an injury or your nose that does not stop. ? Unusual colored urine (red or dark brown) or unusual colored stools (red or black). ? Unusual bruising for unknown reasons. ? A serious fall or if you hit your head (even if there is no bleeding).  Some medicines may interact with Xarelto and might increase your risk of bleeding while on Xarelto. To help avoid this, consult your healthcare provider or pharmacist prior to using any new prescription or non-prescription medications, including herbals, vitamins, non-steroidal anti-inflammatory drugs (NSAIDs) and supplements.  This website has more information on Xarelto: [email or web address].

## 2016-01-18 NOTE — Progress Notes (Signed)
Physical Therapy Evaluation Patient Details Name: Stacy [name] MRN: [id number] DOB: Mar 18, 1960 Today's Date: 01/18/2016   History of Present Illness  Pt presents for right knee manipulation under anesthesia due to arthrofibrosis with PMH of DM, COPD, GERD, spinal stenosis with decompression L2-5, gout, HTN, asthma, HLD, neuropathy  Clinical Impression  Patients knee range measured at 18-80 degrees. She had imorioved motion with mobilization and less pain. She is very motivated. She will begin with outpatient PT on Monday. She was advised to perfrom PROM 4-5x tomorrow. She demsotrated good technique. She will benefit from further skilled PT if she stays in the hospital.     Follow Up Recommendations Outpatient PT    Equipment Recommendations  Rolling walker with 5" wheels    Recommendations for Other Services       Precautions / Restrictions Precautions Precautions: None Restrictions Weight Bearing Restrictions: Yes RLE Weight Bearing: Weight bearing as tolerated      Mobility  Bed Mobility Overal bed mobility: Independent                Transfers Overall transfer level: Needs assistance Equipment used: Rolling walker (2 wheeled) Transfers: Sit to/from Stand Sit to Stand: Supervision         General transfer comment: supervison for initial standing balance   Ambulation/Gait Ambulation/Gait assistance: Supervision Ambulation Distance (Feet): 150 Feet (x2) Assistive device: Rolling walker (2 wheeled) Gait Pattern/deviations: Step-through pattern;Decreased step length - right;Decreased stance time - right   Gait velocity interpretation: <1.8 ft/sec, indicative of risk for recurrent falls    Stairs Stairs: Yes Stairs assistance: Supervision Stair Management: Two rails Number of Stairs: 6 General stair comments: Min cuing for ambualtion   Wheelchair Mobility    Modified Rankin (Stroke Patients Only)       Balance Overall balance assessment: Needs  assistance Sitting-balance support: No upper extremity supported Sitting balance-Leahy Scale: Good     Standing balance support: Bilateral upper extremity supported Standing balance-Leahy Scale: Good                               Pertinent Vitals/Pain Pain Assessment: 0-10 Pain Score: 6  Pain Location: right knee  Pain Descriptors / Indicators: Aching Pain Intervention(s): Limited activity within patient's tolerance;Monitored during session;Premedicated before session;Repositioned;Relaxation    Home Living Family/patient expects to be discharged to:: Private residence Living Arrangements: Spouse/significant other Available Help at Discharge: Family Type of Home: House Home Access: Stairs to enter Entrance Stairs-Rails: None Entrance Stairs-Number of Steps: 2 Home Layout: Two level Home Equipment: Environmental consultantWalker - 2 wheels;Bedside commode;Tub bench;Walker - 4 wheels Additional Comments: Pt can stay downstairs in home    Prior Function Level of Independence: Needs assistance   Gait / Transfers Assistance Needed: using rollator or cane at home  ADL's / Homemaking Assistance Needed: assist with cooking and cleaning        Hand Dominance   Dominant Hand: Left    Extremity/Trunk Assessment   Upper Extremity Assessment Upper Extremity Assessment: Defer to OT evaluation    Lower Extremity Assessment Lower Extremity Assessment: LLE deficits/detail LLE Deficits / Details: PROM 18-80 degrees Grade I and II joint mobilizations used to increase range and decrease pain  LLE: Unable to fully assess due to pain       Communication   Communication: No difficulties  Cognition Arousal/Alertness: Awake/alert Behavior During Therapy: WFL for tasks assessed/performed Overall Cognitive Status: Within Functional Limits for tasks assessed  General Comments      Exercises     Assessment/Plan    PT Assessment Patient needs continued PT  services  PT Problem List Decreased strength;Decreased range of motion;Decreased activity tolerance;Decreased mobility;Decreased balance;Decreased safety awareness;Decreased knowledge of precautions;Pain          PT Treatment Interventions DME instruction;Gait training;Stair training;Functional mobility training;Therapeutic activities;Therapeutic exercise;Manual techniques;Modalities;Neuromuscular re-education;Patient/family education    PT Goals (Current goals can be found in the Care Plan section)  Acute Rehab PT Goals Patient Stated Goal: to get right leg strighter  PT Goal Formulation: With patient Time For Goal Achievement: 02/01/16    Frequency 7X/week   Barriers to discharge        Co-evaluation               End of Session Equipment Utilized During Treatment: Gait belt Activity Tolerance: Patient tolerated treatment well Patient left: in bed Nurse Communication: Mobility status;Need for lift equipment    Functional Assessment Tool Used: clinical decision making  Functional Limitation: Mobility: Walking and moving around Mobility: Walking and Moving Around Current Status [phone](G8978): At least 40 percent but less than 60 percent impaired, limited or restricted Mobility: Walking and Moving Around Goal Status [phone](G8979): At least 20 percent but less than 40 percent impaired, limited or restricted    Time: 0900-0950 PT Time Calculation (min) (ACUTE ONLY): 50 min   Charges:   PT Evaluation $PT Eval Low Complexity: 1 Procedure PT Treatments $Therapeutic Exercise: 8-22 mins (PROM and joint mobilization to improve range )   PT G Codes:   PT G-Codes **NOT FOR INPATIENT CLASS** Functional Assessment Tool Used: clinical decision making  Functional Limitation: Mobility: Walking and moving around Mobility: Walking and Moving Around Current Status ([id number](G8978): At least 40 percent but less than 60 percent impaired, limited or restricted Mobility: Walking and Moving Around Goal  Status [phone](G8979): At least 20 percent but less than 40 percent impaired, limited or restricted    Dessie Comaavid J Emori Kamau PT DPT  01/18/2016, 1:14 PM

## 2016-01-18 NOTE — Discharge Summary (Signed)
Discharge Diagnoses:  Active Problems:   postop Arthrofibrosis of knee joint, right   Surgeries: Procedure(s): CLOSED MANIPULATION RIGHT KNEE on 01/17/2016    Consultants:   Discharged Condition: Improved  Hospital Course: Stacy [name] is an 55 y.o. female who was admitted 01/17/2016 with a chief complaint of arthrofibrosis total knee arthroplasty on the right, with a final diagnosis of right knee adhesive capsulitis and arthrofibrosis.  Patient was brought to the operating room on 01/17/2016 and underwent Procedure(s): CLOSED MANIPULATION RIGHT KNEE.    Patient was given perioperative antibiotics: Anti-infectives    None    .  Patient was given sequential compression devices, early ambulation, and aspirin for DVT prophylaxis.  Recent vital signs: Patient Vitals for the past 24 hrs:  BP Temp Temp src Pulse Resp SpO2 Weight  01/18/16 0500 137/73 99.7 F (37.6 C) Oral 66 - 99 % -  01/17/16 2013 126/61 99.3 F (37.4 C) Oral (!) 59 16 100 % -  01/17/16 1900 (!) 155/73 99.1 F (37.3 C) Oral 72 18 100 % -  01/17/16 1800 135/77 99 F (37.2 C) Oral 64 18 100 % -  01/17/16 1642 (!) 144/62 99 F (37.2 C) Oral 65 18 97 % -  01/17/16 1522 (!) 181/89 99.2 F (37.3 C) Oral 65 18 100 % -  01/17/16 1500 (!) 156/89 98 F (36.7 C) - - - 100 % -  01/17/16 1454 (!) 186/93 - - 74 12 100 % -  01/17/16 1451 (!) 172/106 - - 76 11 100 % -  01/17/16 1447 (!) 186/93 - - 64 10 100 % -  01/17/16 1445 - - - 70 12 100 % -  01/17/16 1443 (!) 192/81 - - 61 10 100 % -  01/17/16 1430 - - - 91 18 94 % -  01/17/16 1419 (!) 189/83 - - 79 11 98 % -  01/17/16 1415 - - - 87 15 98 % -  01/17/16 1411 (!) 179/87 - - 94 12 (!) 89 % -  01/17/16 1404 (!) 212/105 98.1 F (36.7 C) - (!) 103 12 99 % -  01/17/16 1008 (!) 157/81 99.2 F (37.3 C) Oral 79 18 100 % -  01/17/16 1002 - - - - - - 154 lb (69.9 kg)  .  Recent laboratory studies: No results found.  Discharge Medications:   Allergies as of 01/18/2016       Reactions   Other Shortness Of Breath   UNSPECIFIED AGENTS Allergic to perfumes and cleaning products   Ace Inhibitors Cough      Aspirin Nausea Only      Medication List    TAKE these medications   accu-chek soft touch lancets Use as instructed What changed:  how much to take  how to take this  when to take this  additional instructions   acetaminophen-codeine 300-30 MG tablet Commonly known as:  TYLENOL #3 Take 1 tablet by mouth every 4 (four) hours as needed.   albuterol (2.5 MG/3ML) 0.083% nebulizer solution Commonly known as:  PROVENTIL USE 1 VIAL VIA NEBULIZER EVERY 4 HOURS AS NEEDED FOR WHEEZING   allopurinol 100 MG tablet Commonly known as:  ZYLOPRIM Take 1 tablet (100 mg total) by mouth daily.   Colchicine 0.6 MG Caps Take 1 capsule by mouth daily.   EPINEPHrine 0.3 mg/0.3 mL Soaj injection Commonly known as:  EPI-PEN Inject 0.3 mLs (0.3 mg total) into the muscle once. What changed:  when to take this  reasons to take  this   gabapentin 300 MG capsule Commonly known as:  NEURONTIN Take 1 capsule (300 mg total) by mouth 3 (three) times daily.   glucose blood test strip Commonly known as:  TRUE METRIX BLOOD GLUCOSE TEST USE AS DIRECTED BY PHYSICIAN What changed:  Another medication with the same name was changed. Make sure you understand how and when to take each.   glucose blood test strip Commonly known as:  ACCU-CHEK AVIVA Use as instructed What changed:  how much to take  how to take this  when to take this  additional instructions   hydrochlorothiazide 25 MG tablet Commonly known as:  HYDRODIURIL Take 1 tablet (25 mg total) by mouth daily.   hydrocortisone-pramoxine 2.5-1 % rectal cream Commonly known as:  ANALPRAM-HC Place 1 application rectally 2 (two) times daily. What changed:  when to take this  reasons to take this   hydrOXYzine 10 MG tablet Commonly known as:  ATARAX/VISTARIL TAKE 1 TABLET BY MOUTH THREE  TIMES DAILY AS NEEDED FOR ITCHING   metFORMIN 750 MG 24 hr tablet Commonly known as:  GLUCOPHAGE-XR Take 1 tablet (750 mg total) by mouth daily with breakfast.   methocarbamol 500 MG tablet Commonly known as:  ROBAXIN Take 1 tablet (500 mg total) by mouth every 6 (six) hours as needed for muscle spasms. What changed:  Another medication with the same name was added. Make sure you understand how and when to take each.   methocarbamol 500 MG tablet Commonly known as:  ROBAXIN Take 1 tablet (500 mg total) by mouth every 6 (six) hours as needed for muscle spasms. What changed:  You were already taking a medication with the same name, and this prescription was added. Make sure you understand how and when to take each.   mometasone-formoterol 100-5 MCG/ACT Aero Commonly known as:  DULERA Inhale 2 puffs into the lungs 2 (two) times daily.   multivitamin with minerals Tabs tablet Take 1 tablet by mouth daily.   oxyCODONE 5 MG immediate release tablet Commonly known as:  Oxy IR/ROXICODONE Take 1 tablet (5 mg total) by mouth once as needed (for pain score of 1-4).   pantoprazole 40 MG tablet Commonly known as:  PROTONIX Take 1 tablet (40 mg total) by mouth daily.   rivaroxaban 10 MG Tabs tablet Commonly known as:  XARELTO Take 1 tablet (10 mg total) by mouth daily with breakfast.   simvastatin 20 MG tablet Commonly known as:  ZOCOR Take 1 tablet (20 mg total) by mouth at bedtime.   TRUE METRIX METER Devi 1 Device by Does not apply route 4 (four) times daily -  before meals and at bedtime.       Diagnostic Studies: No results found.  Patient benefited maximally from their hospital stay and there were no complications.     Disposition: 01-Home or Self Care Discharge Instructions    Call MD / Call 911    Complete by:  As directed    If you experience chest pain or shortness of breath, CALL 911 and be transported to the hospital emergency room.  If you develope a fever above  101 F, pus (white drainage) or increased drainage or redness at the wound, or calf pain, call your surgeon's office.   Call MD / Call 911    Complete by:  As directed    If you experience chest pain or shortness of breath, CALL 911 and be transported to the hospital emergency room.  If you develope a  fever above 101 F, pus (white drainage) or increased drainage or redness at the wound, or calf pain, call your surgeon's office.   Constipation Prevention    Complete by:  As directed    Drink plenty of fluids.  Prune juice may be helpful.  You may use a stool softener, such as Colace (over the counter) 100 mg twice a day.  Use MiraLax (over the counter) for constipation as needed.   Constipation Prevention    Complete by:  As directed    Drink plenty of fluids.  Prune juice may be helpful.  You may use a stool softener, such as Colace (over the counter) 100 mg twice a day.  Use MiraLax (over the counter) for constipation as needed.   Diet - low sodium heart healthy    Complete by:  As directed    Diet - low sodium heart healthy    Complete by:  As directed    Discharge instructions    Complete by:  As directed    Keep knee incisions and dressing dry for 4 days post op then may wet while bathing and apply bandaids. Call if fever or chills or increased drainage. Go to ER if acutely short of breath or call for ambulance. Return for follow up in 2 weeks. May full weight bear on the surgical leg unless told otherwise.Take xarelto daily to prevent blood clots.  Work on ROM of the knee as often as possible. See therapy next week and start exercises to keep the motion in the right knee. Ice to the right knee for swelling and pain.   Driving restrictions    Complete by:  As directed    No driving for 4 weeks   Increase activity slowly as tolerated    Complete by:  As directed    Increase activity slowly as tolerated    Complete by:  As directed    Lifting restrictions    Complete by:  As  directed    No lifting for 3 weeks     Follow-up Information    Kerrin ChampagneJames E Nitka, MD Follow up in 2 week(s).   Specialty:  Orthopedic Surgery Contact information: [address] [place] [address] [phone]            Signed: Nadara MustardMarcus V Alanii Ramer 01/18/2016, 8:12 AM

## 2016-01-18 NOTE — Evaluation (Signed)
Occupational Therapy Evaluation and Discharge Patient Details Name: Stacy [name] MRN: [id number] DOB: May 21, 1960 Today's Date: 01/18/2016    History of Present Illness Pt presents for right knee manipulation under anesthesia due to arthrofibrosis with PMH of DM, COPD, GERD, spinal stenosis with decompression L2-5, gout, HTN, asthma, HLD, neuropathy   Clinical Impression   Pt currently at baseline, and able to perform all ADL with modified independence. Pt able to demonstrate good hand placement during transfers and safety during ADL. Pt with no questions or concerns for OT, Thank you for this referral, OT to sign off.     Follow Up Recommendations  No OT follow up;Supervision - Intermittent    Equipment Recommendations  None recommended by OT    Recommendations for Other Services       Precautions / Restrictions Precautions Precautions: None Restrictions Weight Bearing Restrictions: Yes RLE Weight Bearing: Weight bearing as tolerated      Mobility Bed Mobility Overal bed mobility: Independent                Transfers Overall transfer level: Modified independent Equipment used: Rolling walker (2 wheeled) Transfers: Sit to/from Stand Sit to Stand: Modified independent (Device/Increase time)         General transfer comment: Pt with good hand placement and no assist needed    Balance Overall balance assessment: Needs assistance Sitting-balance support: No upper extremity supported Sitting balance-Leahy Scale: Good     Standing balance support: No upper extremity supported;During functional activity Standing balance-Leahy Scale: Good Standing balance comment: sink level ADL                            ADL Overall ADL's : At baseline;Modified independent                                       General ADL Comments: Pt was dressed when OT came into the room, and reports doing it all herself. Pt able to ambulate with RW to  toilet and perform managing clothes and peri care, and sink level ADL with no assistance needed.     Vision Vision Assessment?: No apparent visual deficits   Perception     Praxis      Pertinent Vitals/Pain Pain Assessment: 0-10 Pain Score: 6  Pain Location: right knee  Pain Descriptors / Indicators: Aching Pain Intervention(s): Monitored during session;Repositioned     Hand Dominance Left   Extremity/Trunk Assessment Upper Extremity Assessment Upper Extremity Assessment: Overall WFL for tasks assessed   Lower Extremity Assessment Lower Extremity Assessment: Defer to PT evaluation;LLE deficits/detail LLE Deficits / Details: PROM 18-80 degrees Grade I and II joint mobilizations used to increase range and decrease pain  LLE: Unable to fully assess due to pain       Communication Communication Communication: No difficulties   Cognition Arousal/Alertness: Awake/alert Behavior During Therapy: WFL for tasks assessed/performed Overall Cognitive Status: Within Functional Limits for tasks assessed                     General Comments       Exercises       Shoulder Instructions      Home Living Family/patient expects to be discharged to:: Private residence Living Arrangements: Spouse/significant other Available Help at Discharge: Family Type of Home: House Home Access: Stairs to enter Entergy CorporationEntrance Stairs-Number of Steps:  2 Entrance Stairs-Rails: None Home Layout: Two level Alternate Level Stairs-Number of Steps: 13 Alternate Level Stairs-Rails: Left Bathroom Shower/Tub: Tub/shower unit Shower/tub characteristics: Engineer, building servicesCurtain Bathroom Toilet: Standard Bathroom Accessibility: Yes How Accessible: Accessible via walker Home Equipment: Walker - 2 wheels;Bedside commode;Tub bench;Walker - 4 wheels   Additional Comments: Pt can stay downstairs in home      Prior Functioning/Environment Level of Independence: Needs assistance  Gait / Transfers Assistance Needed:  using rollator or cane at home ADL's / Homemaking Assistance Needed: assist with cooking and cleaning            OT Problem List:     OT Treatment/Interventions:      OT Goals(Current goals can be found in the care plan section) Acute Rehab OT Goals Patient Stated Goal: to get home OT Goal Formulation: With patient Time For Goal Achievement: 01/25/16 Potential to Achieve Goals: Good  OT Frequency:     Barriers to D/C:            Co-evaluation              End of Session Equipment Utilized During Treatment: Rolling walker CPM Right Knee CPM Right Knee: Off Nurse Communication: Mobility status  Activity Tolerance: Patient tolerated treatment well Patient left: in chair;with call bell/phone within reach   Time: 1117-1144 OT Time Calculation (min): 27 min Charges:  OT General Charges $OT Visit: 1 Procedure OT Evaluation $OT Eval Low Complexity: 1 Procedure OT Treatments $Self Care/Home Management : 8-22 mins G-Codes: OT G-codes **NOT FOR INPATIENT CLASS** Functional Assessment Tool Used: Clinical Judgement Functional Limitation: Self care Self Care Current Status ([id number](G8987): At least 20 percent but less than 40 percent impaired, limited or restricted Self Care Goal Status ([id number](G8988): At least 1 percent but less than 20 percent impaired, limited or restricted Self Care Discharge Status [phone](G8989): At least 20 percent but less than 40 percent impaired, limited or restricted  Emelda FearLaura J Kalya Troeger 01/18/2016, 4:16 PM Sherryl MangesLaura Devin Foskey OTR/L [phone]

## 2016-02-26 ENCOUNTER — Ambulatory Visit (INDEPENDENT_AMBULATORY_CARE_PROVIDER_SITE_OTHER): Payer: Medicaid Other | Admitting: Specialist

## 2016-02-26 ENCOUNTER — Encounter (INDEPENDENT_AMBULATORY_CARE_PROVIDER_SITE_OTHER): Payer: Self-pay | Admitting: Specialist

## 2016-02-26 ENCOUNTER — Ambulatory Visit (INDEPENDENT_AMBULATORY_CARE_PROVIDER_SITE_OTHER): Payer: Self-pay

## 2016-02-26 ENCOUNTER — Encounter (INDEPENDENT_AMBULATORY_CARE_PROVIDER_SITE_OTHER): Payer: Self-pay

## 2016-02-26 VITALS — BP 126/80 | Ht 66.0 in | Wt 148.0 lb

## 2016-02-26 DIAGNOSIS — G8929 Other chronic pain: Secondary | ICD-10-CM

## 2016-02-26 DIAGNOSIS — M1711 Unilateral primary osteoarthritis, right knee: Secondary | ICD-10-CM

## 2016-02-26 DIAGNOSIS — M24661 Ankylosis, right knee: Secondary | ICD-10-CM | POA: Diagnosis not present

## 2016-02-26 DIAGNOSIS — M545 Low back pain: Secondary | ICD-10-CM

## 2016-02-26 MED ORDER — METHOCARBAMOL 500 MG PO TABS: 500.0000 mg | ORAL_TABLET | Freq: Four times a day (QID) | ORAL | 3 refills | Status: DC | PRN

## 2016-02-26 MED ORDER — OXYCODONE HCL 5 MG PO TABS: 5.0000 mg | ORAL_TABLET | Freq: Once | ORAL | 0 refills | Status: DC | PRN

## 2016-02-26 NOTE — Progress Notes (Signed)
Office Visit Note   Patient: Stacy [name]           Date of Birth: [birth year]           MRN: [id number] Visit Date: 02/26/2016              Requested by: Quentin Angst, MD [address] [place], [place] [address] PCP: Jeanann Lewandowsky, MD   Assessment & Plan: Visit Diagnoses:  1. Primary osteoarthritis of right knee   2. postop Arthrofibrosis of knee joint, right     Plan:Ambulate full weight bearing on the right leg. Work on ROM and stretching, even at home. A stationary bike or a loose stepper for movement of the knee. Ice after exercise and heat before exercise. Obtain the functional brace to work on stretching the knee in extension and in flexion to regain more motion.   Follow-Up Instructions: No Follow-up on file.   Orders:  Orders Placed This Encounter  Procedures  . XR Knee 1-2 Views Right   No orders of the defined types were placed in this encounter.     Procedures: No procedures performed   Clinical Data: No additional findings.   Subjective: No chief complaint on file.   Stacy [name] is here for follow up of Right knee manipulation.  She states that she doing ok, that she is going to Physical Therapy and doing exercises at home.  She does have some swelling in the knee but she working to reduce that by using Ice and heat on it.    Review of Systems  Constitutional: Negative.   HENT: Negative.   Eyes: Negative.   Respiratory: Negative.   Cardiovascular: Negative.   Gastrointestinal: Positive for abdominal pain, diarrhea, nausea and vomiting.  Endocrine: Negative.   Genitourinary: Negative.   Musculoskeletal: Negative.   Skin: Negative.   Allergic/Immunologic: Negative.   Neurological: Negative.   Hematological: Negative.   Psychiatric/Behavioral: Negative.      Objective: Vital Signs: BP 126/80 (BP Location: Left Arm, Patient Position: Sitting)   Ht 5\' 6"  (1.676 m)   Wt 148 lb (67.1 kg)   BMI 23.89 kg/m   Physical Exam   Constitutional: She is oriented to person, place, and time. She appears well-developed and well-nourished.  HENT:  Head: Normocephalic and atraumatic.  Eyes: EOM are normal. Pupils are equal, round, and reactive to light.  Neck: Normal range of motion. Neck supple.  Pulmonary/Chest: Effort normal and breath sounds normal.  Abdominal: Soft. Bowel sounds are normal.  Musculoskeletal:       Right knee: She exhibits no effusion.  Neurological: She is alert and oriented to person, place, and time.  Skin: Skin is warm and dry.  Psychiatric: She has a normal mood and affect. Her behavior is normal. Judgment and thought content normal.    Right Knee Exam   Range of Motion  Extension:  -20 abnormal  Flexion:  80 abnormal   Muscle Strength   The patient has normal right knee strength.  Tests  Lachman:  Anterior - negative    Posterior - negative Drawer:       Anterior - negative    Posterior - negative Pivot Shift: negative Patellar Apprehension: negative  Other  Erythema: absent Scars: present Pulse: present Swelling: none Other tests: no effusion present      Specialty Comments:  No specialty comments available.  Imaging: No results found.   PMFS History: Patient Active Problem List   Diagnosis Date Noted  . Neurogenic  claudication due to lumbar spinal stenosis 06/03/2015    Priority: High    Class: Chronic  . Chondromalacia of both patellae 06/03/2015    Priority: High    Class: Chronic  . postop Arthrofibrosis of knee joint, right 11/29/2015  . Pap smear for cervical cancer screening 10/24/2015  . Healthcare maintenance 10/24/2015  . Osteoarthritis of right knee 09/06/2015  . Type 2 diabetes mellitus without complication, without long-term current use of insulin (HCC) 06/13/2015  . Spinal stenosis, lumbar region, with neurogenic claudication 06/03/2015  . Tobacco use disorder 04/25/2015  . DJD (degenerative joint disease) of knee 01/04/2015  . Falls  frequently 11/29/2014  . Hemorrhoid 11/14/2014  . Bilateral knee pain 07/31/2014  . Asthma, chronic 07/19/2014  . Gout of big toe 07/19/2014  . Midline low back pain with right-sided sciatica 01/01/2014  . Bilateral low back pain without sciatica 08/14/2013  . Essential hypertension 08/14/2013  . Gastroesophageal reflux disease without esophagitis 08/14/2013  . COPD exacerbation (HCC) 08/14/2013  . Colon cancer screening 08/14/2013  . Low back pain 05/15/2013  . Preventative health care 05/15/2013  . GERD (gastroesophageal reflux disease) 02/16/2013  . DM (diabetes mellitus) (HCC) 02/16/2013  . COPD (chronic obstructive pulmonary disease) (HCC) 04/17/2011  . Hypertension 04/17/2011  . Cough 04/16/2011   Past Medical History:  Diagnosis Date  . Arthritis   . 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 Asthma   . COPD (chronic obstructive pulmonary disease) (HCC)   . Diabetes mellitus    Type II  . GERD (gastroesophageal reflux disease)    Pt on Protonix daily  . Gout   . Headache(784.0)    otc meds prn  . Hyperlipidemia   . Hypertension    Pt on lisinopril  . Irritable bowel syndrome 11/19/2010  . Neuropathy (HCC)   . Shortness of breath    occasional - uses breathing tx at home    Family History  Problem Relation Age of Onset  . Hypertension Father   . Cancer Father   . Asthma Son     had as a child  . Heart disease Sister   . Breast cancer Sister   . Hypertension Brother     Past Surgical History:  Procedure Laterality Date  . CHOLECYSTECTOMY    . ENDOMETRIAL ABLATION  10/2010  . HERNIA REPAIR     umbicial hernia  . KNEE CLOSED REDUCTION Right 12/06/2015   Procedure: CLOSED MANIPULATION RIGHT KNEE;  Surgeon: Kerrin Champagne, MD;  Location: [facility] OR;  Service: Orthopedics;  Laterality: Right;  . KNEE CLOSED REDUCTION Right 01/17/2016   Procedure: CLOSED MANIPULATION RIGHT KNEE;  Surgeon: Kerrin Champagne, MD;  Location: [facility] OR;  Service: Orthopedics;   Laterality: Right;  . KNEE JOINT MANIPULATION Right 12/06/2015  . LUMBAR DISC SURGERY  06/03/2015   L 2  L3 L4 L5   . LUMBAR LAMINECTOMY/DECOMPRESSION MICRODISCECTOMY N/A 06/03/2015   Procedure: Bilateral lateral recess decompression L2-3, L3-4, L4-5;  Surgeon: Kerrin Champagne, MD;  Location: [facility] OR;  Service: Orthopedics;  Laterality: N/A;  . svd      x 2  . TOTAL KNEE ARTHROPLASTY Right 09/06/2015   Procedure: RIGHT TOTAL KNEE ARTHROPLASTY;  Surgeon: Kerrin Champagne, MD;  Location: [facility] OR;  Service: Orthopedics;  Laterality: Right;  . TUBAL LIGATION    . UPPER GASTROINTESTINAL ENDOSCOPY  04/28/11   Social History   Occupational History  . unemployed Unemployed   Social History Main Topics  .  Smoking status: Current Every Day Smoker    Packs/day: 0.25    Years: 32.00    Types: Cigarettes  . Smokeless tobacco: Never Used  . Alcohol use No  . Drug use: No  . Sexual activity: Yes    Birth control/ protection: Surgical

## 2016-02-26 NOTE — Patient Instructions (Signed)
Ambulate full weight bearing on the right leg. Work on ROM and stretching, even at home. A stationary bike or a loose stepper for movement of the knee. Ice after exercise and heat before exercise. Obtain the functional brace to work on stretching the knee in extension and in flexion to regain more motion.

## 2016-03-11 ENCOUNTER — Ambulatory Visit: Payer: Medicaid Other | Attending: Internal Medicine | Admitting: Internal Medicine

## 2016-03-11 ENCOUNTER — Encounter: Payer: Self-pay | Admitting: Internal Medicine

## 2016-03-11 VITALS — BP 137/87 | HR 85 | Temp 98.7°F | Resp 18 | Ht 66.0 in | Wt 151.4 lb

## 2016-03-11 DIAGNOSIS — R202 Paresthesia of skin: Secondary | ICD-10-CM | POA: Diagnosis present

## 2016-03-11 DIAGNOSIS — Z79899 Other long term (current) drug therapy: Secondary | ICD-10-CM | POA: Diagnosis not present

## 2016-03-11 DIAGNOSIS — Z7901 Long term (current) use of anticoagulants: Secondary | ICD-10-CM | POA: Diagnosis not present

## 2016-03-11 DIAGNOSIS — E119 Type 2 diabetes mellitus without complications: Secondary | ICD-10-CM | POA: Insufficient documentation

## 2016-03-11 DIAGNOSIS — M109 Gout, unspecified: Secondary | ICD-10-CM | POA: Diagnosis not present

## 2016-03-11 DIAGNOSIS — M722 Plantar fascial fibromatosis: Secondary | ICD-10-CM | POA: Diagnosis not present

## 2016-03-11 DIAGNOSIS — K529 Noninfective gastroenteritis and colitis, unspecified: Secondary | ICD-10-CM | POA: Diagnosis not present

## 2016-03-11 DIAGNOSIS — F17200 Nicotine dependence, unspecified, uncomplicated: Secondary | ICD-10-CM | POA: Diagnosis not present

## 2016-03-11 DIAGNOSIS — I10 Essential (primary) hypertension: Secondary | ICD-10-CM | POA: Diagnosis not present

## 2016-03-11 DIAGNOSIS — Z7984 Long term (current) use of oral hypoglycemic drugs: Secondary | ICD-10-CM | POA: Diagnosis not present

## 2016-03-11 DIAGNOSIS — F17210 Nicotine dependence, cigarettes, uncomplicated: Secondary | ICD-10-CM | POA: Insufficient documentation

## 2016-03-11 LAB — COMPLETE METABOLIC PANEL WITH GFR
ALT: 14 U/L (ref 6–29)
AST: 25 U/L (ref 10–35)
Albumin: 4.5 g/dL (ref 3.6–5.1)
Alkaline Phosphatase: 90 U/L (ref 33–130)
BUN: 15 mg/dL (ref 7–25)
CO2: 28 mmol/L (ref 20–31)
Calcium: 10.1 mg/dL (ref 8.6–10.4)
Chloride: 102 mmol/L (ref 98–110)
Creat: 0.9 mg/dL (ref 0.50–1.05)
GFR, Est African American: 83 mL/min (ref >=60)
GFR, Est Non African American: 72 mL/min (ref >=60)
Glucose, Bld: 97 mg/dL (ref 65–99)
Potassium: 3.4 mmol/L — ABNORMAL LOW (ref 3.5–5.3)
Sodium: 140 mmol/L (ref 135–146)
Total Bilirubin: 0.3 mg/dL (ref 0.2–1.2)
Total Protein: 7.4 g/dL (ref 6.1–8.1)

## 2016-03-11 LAB — GLUCOSE, POCT (MANUAL RESULT ENTRY): POC Glucose: 126 mg/dl — AB (ref 70–99)

## 2016-03-11 LAB — POCT GLYCOSYLATED HEMOGLOBIN (HGB A1C): Hemoglobin A1C: 6

## 2016-03-11 MED ORDER — LOPERAMIDE HCL 2 MG PO TABS: 2.0000 mg | ORAL_TABLET | Freq: Four times a day (QID) | ORAL | 0 refills | Status: DC | PRN

## 2016-03-11 MED ORDER — METRONIDAZOLE 500 MG PO TABS: 500.0000 mg | ORAL_TABLET | Freq: Three times a day (TID) | ORAL | 0 refills | Status: DC

## 2016-03-11 MED ORDER — NICOTINE 14 MG/24HR TD PT24: 14.0000 mg | MEDICATED_PATCH | Freq: Every day | TRANSDERMAL | 3 refills | Status: DC

## 2016-03-11 MED ORDER — ACETAMINOPHEN-CODEINE #3 300-30 MG PO TABS: 1.0000 | ORAL_TABLET | ORAL | 0 refills | Status: DC | PRN

## 2016-03-11 NOTE — Progress Notes (Signed)
Subjective:  Patient ID: Stacy [name], female    DOB: 1960/06/06  Age: 56 y.o. MRN: [id number]  CC: Ting[place]  HPI [name] presents for nausea, vomiting, diarrhea for past 4 days. She says that as soon as she tries to eat or drink anything she is having diarrhea. She has also had chills, sweats but is unsure of fever. She tried liquid pepto and she vomited it. She has had some sick family members but also has a history of IBS. She feels she has 2 episodes of vomiting and 10 episodes of diarrhea per day. Pt was previously seen by GI  But hasn't recently. She has not had a colonoscopy yet and last endoscopy was approximately 2011. Stool is brown, watery. She believes her last antibiotic exposure was in December.   She also complains of tingling/pins and needles in her left foot. She localizes to the heel of the foot. She has not seen podiatry. No redness or swelling. She feels it is worse as the day goes on. She is still being followed by ortho for ROM in right knee. She had a closed manipulation in December.   She has had a history of neuropathy and takes gabapentin TID. She checks her sugar at home and says her sugars are normally 120-130s. Her ha1c is 6.0%. She is scheduled to see a nutritionist. Denies episodes of hypoglycemia.   She is still smoking. She needs a prescription for the patches to help stop smoking. She has not been successful recently but had quit many years ago. She does smoke inside and estimates that she smokes 1-2 packs per day.   Outpatient Medications Prior to Visit  Medication Sig Dispense Refill  . albuterol (PROVENTIL) (2.5 MG/3ML) 0.083% nebulizer solution USE 1 VIAL VIA NEBULIZER EVERY 4 HOURS AS NEEDED FOR WHEEZING 1650 mL 0  . allopurinol (ZYLOPRIM) 100 MG tablet Take 1 tablet (100 mg total) by mouth daily. 90 tablet 3  . Blood Glucose Monitoring Suppl (TRUE METRIX METER) DEVI 1 Device by Does not apply route 4 (four) times daily -  before meals and at  bedtime. 1 Device 0  . celecoxib (CELEBREX) 200 MG capsule Take 200 mg by mouth 2 (two) times daily.    . cyclobenzaprine (FLEXERIL) 5 MG tablet Take 5 mg by mouth daily.    Marland Kitchen EPINEPHrine 0.3 mg/0.3 mL IJ SOAJ injection Inject 0.3 mLs (0.3 mg total) into the muscle once. (Patient taking differently: Inject 0.3 mg into the muscle daily as needed (for allergic reaction). ) 1 Device 1  . gabapentin (NEURONTIN) 300 MG capsule Take 1 capsule (300 mg total) by mouth 3 (three) times daily. 270 capsule 3  . glucose blood (ACCU-CHEK AVIVA) test strip Use as instructed (Patient taking differently: 1 each by Other route 4 (four) times daily. Use as instructed) 100 each 12  . glucose blood (TRUE METRIX BLOOD GLUCOSE TEST) test strip USE AS DIRECTED BY PHYSICIAN 100 each 12  . hydrochlorothiazide (HYDRODIURIL) 25 MG tablet Take 1 tablet (25 mg total) by mouth daily. 90 tablet 3  . hydrocortisone-pramoxine (ANALPRAM-HC) 2.5-1 % rectal cream Place 1 application rectally 2 (two) times daily. (Patient taking differently: Place 1 application rectally 2 (two) times daily as needed for hemorrhoids. ) 30 g 0  . hydrOXYzine (ATARAX/VISTARIL) 10 MG tablet TAKE 1 TABLET BY MOUTH THREE TIMES DAILY AS NEEDED FOR ITCHING 30 tablet 2  . Lancets (ACCU-CHEK SOFT TOUCH) lancets Use as instructed (Patient taking differently: 1 each by Other route  4 (four) times daily. Use as instructed) 100 each 12  . metFORMIN (GLUCOPHAGE-XR) 750 MG 24 hr tablet Take 1 tablet (750 mg total) by mouth daily with breakfast. 90 tablet 3  . methocarbamol (ROBAXIN) 500 MG tablet Take 1 tablet (500 mg total) by mouth every 6 (six) hours as needed for muscle spasms. 30 tablet 0  . mometasone-formoterol (DULERA) 100-5 MCG/ACT AERO Inhale 2 puffs into the lungs 2 (two) times daily. 1 Inhaler 3  . Multiple Vitamin (MULTIVITAMIN WITH MINERALS) TABS tablet Take 1 tablet by mouth daily.    Marland Kitchen oxyCODONE (OXY IR/ROXICODONE) 5 MG immediate release tablet Take 1  tablet (5 mg total) by mouth once as needed (for pain score of 1-4). 40 tablet 0  . pantoprazole (PROTONIX) 40 MG tablet Take 1 tablet (40 mg total) by mouth daily. 90 tablet 3  . potassium chloride (KLOR-CON) 20 MEQ packet Take by mouth once.    . rivaroxaban (XARELTO) 10 MG TABS tablet Take 1 tablet (10 mg total) by mouth daily with breakfast. 30 tablet 0  . simvastatin (ZOCOR) 20 MG tablet Take 1 tablet (20 mg total) by mouth at bedtime. 90 tablet 3  . valsartan-hydrochlorothiazide (DIOVAN-HCT) 160-12.5 MG tablet Take 1 tablet by mouth daily.    Marland Kitchen acetaminophen-codeine (TYLENOL #3) 300-30 MG tablet Take 1 tablet by mouth every 4 (four) hours as needed. 90 tablet 0  . Colchicine 0.6 MG CAPS Take 1 capsule by mouth daily. 90 capsule 3  . methocarbamol (ROBAXIN) 500 MG tablet Take 1 tablet (500 mg total) by mouth every 6 (six) hours as needed for muscle spasms. 60 tablet 3   No facility-administered medications prior to visit.    ROS Review of Systems  Constitutional: Positive for appetite change (decreased appetite), chills and fatigue. Negative for diaphoresis and fever.  HENT: Negative.   Eyes: Negative.   Respiratory: Negative.   Cardiovascular: Negative.   Gastrointestinal: Positive for abdominal pain, diarrhea, nausea and vomiting. Negative for abdominal distention and blood in stool.  Endocrine: Negative.   Genitourinary: Negative.   Musculoskeletal: Negative.   Skin: Negative.   Neurological: Negative.   Psychiatric/Behavioral: Negative.    Objective:  BP 137/87 (BP Location: Right Arm, Patient Position: Sitting, Cuff Size: Normal)   Pulse 85   Temp 98.7 F (37.1 C) (Oral)   Resp 18   Ht 5\' 6"  (1.676 m)   Wt 151 lb 6.4 oz (68.7 kg)   SpO2 97%   BMI 24.44 kg/m   BP/Weight 03/11/2016 02/26/2016 01/18/2016  Systolic BP 137 126 137  Diastolic BP 87 80 73  Wt. (Lbs) 151.4 148 -  BMI 24.44 23.89 -    Lab Results  Component Value Date   WBC 6.1 01/17/2016   HGB 12.4  01/17/2016   HCT 37.1 01/17/2016   PLT 268 01/17/2016   GLUCOSE 129 (H) 01/17/2016   CHOL 194 07/19/2014   TRIG 118 07/19/2014   HDL 46 07/19/2014   LDLCALC 124 (H) 07/19/2014   ALT 16 08/30/2015   AST 21 08/30/2015   NA 139 01/17/2016   K 3.0 (L) 01/17/2016   CL 100 (L) 01/17/2016   CREATININE 0.81 01/17/2016   BUN 8 01/17/2016   CO2 29 01/17/2016   TSH 1.677 07/19/2014   INR 0.95 01/17/2016   HGBA1C 6.0 03/11/2016   MICROALBUR <0.2 07/19/2014   Physical Exam  Constitutional: She is oriented to person, place, and time. She appears well-developed and well-nourished.  HENT:  Head: Normocephalic and  atraumatic.  Eyes: Conjunctivae are normal. Pupils are equal, round, and reactive to light.  Neck: Normal range of motion. No JVD present.  Cardiovascular: Normal rate and regular rhythm.   Pulmonary/Chest: Effort normal and breath sounds normal. She has no wheezes. She has no rales.  Abdominal: Soft. She exhibits no distension. There is tenderness.  Musculoskeletal:  Reduced ROM right knee  Neurological: She is alert and oriented to person, place, and time.  Skin: Skin is warm and dry.  Psychiatric: She has a normal mood and affect. Her behavior is normal.   Assessment & Plan:   Problem List Items Addressed This Visit      Cardiovascular and Mediastinum   Essential hypertension   Relevant Orders   COMPLETE METABOLIC PANEL WITH GFR     Endocrine   Type 2 diabetes mellitus without complication, without long-term current use of insulin (HCC) - Primary   Relevant Orders   POCT A1C (Completed)   Glucose (CBG) (Completed)   Ambulatory referral to Podiatry     Musculoskeletal and Integument   Gout of big toe   Relevant Medications   acetaminophen-codeine (TYLENOL #3) 300-30 MG tablet     Other   Tobacco use disorder    Other Visit Diagnoses    Gastroenteritis       Relevant Medications   metroNIDAZOLE (FLAGYL) 500 MG tablet   loperamide (IMODIUM A-D) 2 MG tablet    Other Relevant Orders   COMPLETE METABOLIC PANEL WITH GFR   Clostridium Difficile by PCR   Ova and parasite examination   Stool Cells, WBC & RBC    Gastroenteritis - If you can provide a stool sample we will check it for infection and c.diff. I am concerned with recent antibiotic exposure that this may be the cause. In the meantime I will start you on metronidazole 3 times a day for 5 days and check labs today to make sure you are hydrated. Don't drink alcohol with this medication.   Plantar fascitis - Stretching your foot may help ease this pain. We will refer you to podiatry as well.   Diabetes - type 2 controlled - your HbA1C is improved today. No changes to your current medications as they appear to be controlling your blood sugars and I am pleased with the progress. Continue to monitor for signs of hypoglycemia and watching your diet.   Smoking cessation - Motivational interviewing performed to assess patient's readiness. At this time patient is not able to verbalize plan and strategies for successfully quitting. I have encouraged her to pick a quit date. On the day you quit, you can start the patches instead of smoking. If you smoke and use the patches it would be more nicotine than you are getting now. If you smoke and use the patches at the same time you have limited chance of succeeding.    Meds ordered this encounter  Medications  . metroNIDAZOLE (FLAGYL) 500 MG tablet    Sig: Take 1 tablet (500 mg total) by mouth 3 (three) times daily.    Dispense:  15 tablet    Refill:  0  . loperamide (IMODIUM A-D) 2 MG tablet    Sig: Take 1 tablet (2 mg total) by mouth 4 (four) times daily as needed for diarrhea or loose stools.    Dispense:  20 tablet    Refill:  0  . acetaminophen-codeine (TYLENOL #3) 300-30 MG tablet    Sig: Take 1 tablet by mouth every 4 (four) hours as  needed.    Dispense:  90 tablet    Refill:  0    Follow-up: Return in about 3 months (around 06/08/2016) for  Hemoglobin A1C and Follow up, DM, Follow up HTN, Follow up Pain and comorbidities.   Evaluation and management procedures were performed by me with DNP Student in attendance, note written by DNP student under my supervision and collaboration. I have reviewed the note and I agree with the management and plan.   Jeanann Lewandowsky, MD, MHA, CPE, FACP, FAAP [facility] [place], [place] [phone]   03/11/2016, 6:43 PM

## 2016-03-11 NOTE — Patient Instructions (Signed)
Bloody Diarrhea Bloody diarrhea is frequent loose and watery bowel movements that contain blood. The blood can be hard to see (occult) or notice. Bloody diarrhea may be caused by medical conditions such as:  Ulcerative colitis.  Crohn disease.  Intestinal infection.  Viral gastroenteritis or bacterial gastroenteritis. Finding out why there is blood is in your diarrhea is necessary so your health care provider can prescribe the right treatment for you. Follow the instructions from your health care provider about treating the cause of your bloody diarrhea. Any type of diarrhea can make you feel weak and dehydrated. Dehydration can make you tired and thirsty, cause you to have a dry mouth, and decrease how often you urinate. Follow these instructions at home: Follow instructions from your health care provider about how to care for yourself at home. Eating and drinking Follow these recommendations as told by your health care provider:  Take an oral rehydration solution (ORS). This is a drink that is sold at pharmacies and retail stores.  Drink clear fluids such as water, ice chips, diluted fruit juice, and low-calorie sports drinks.  Eat bland, easy-to-digest foods in small amounts as you are able. These foods include bananas, applesauce, rice, lean meats, toast, and crackers.  Avoid drinking fluids that contain a lot of sugar or caffeine, such as energy drinks, sports drinks, and soda.  Avoid alcohol.  Avoid spicy or fatty foods. General instructions  Drink enough fluid to keep your urine clear or pale yellow.  Wash your hands often. If soap and water are not available, use hand sanitizer.  Make sure that all people in your household wash their hands well and often.  Take over-the-counter and prescription medicines only as told by your health care provider.  Rest at home while you recover.  Take a warm bath to relieve any burning or pain from frequent diarrhea episodes.  Watch  your condition for any changes.  Keep all follow-up visits as told by your health care provider. This is important. Contact a health care provider if:  You have a fever.  You have new symptoms.  Your diarrhea gets worse.  You cannot keep fluids down.  You have a headache.  You feel light-headed or dizzy.  You have muscle cramps. Get help right away if:  You have chest pain.  You feel extremely weak or you faint.  The blood in your diarrhea increases or turns a different color.  You vomit and the vomit is bloody or looks black.  You have persistent diarrhea.  You have severe pain, cramping, or bloating in your abdomen.  You have trouble breathing or you are breathing very quickly.  Your heart is beating very quickly.  Your skin feels cold or clammy.  You feel confused.  You have signs of dehydration, such as:  Dark urine, very little urine, or no urine.  Cracked lips.  Dry mouth.  Sunken eyes.  Sleepiness.  Weakness. This information is not intended to replace advice given to you by your health care provider. Make sure you discuss any questions you have with your health care provider. Document Released: 01/19/2005 Document Revised: 05/30/2015 Document Reviewed: 09/25/2014 Elsevier Interactive Patient Education  2017 [employer] Plantar Fasciitis Plantar fasciitis is a painful foot condition that affects the heel. It occurs when the band of tissue that connects the toes to the heel bone (plantar fascia) becomes irritated. This can happen after exercising too much or doing other repetitive activities (overuse injury). The pain from plantar fasciitis can  range from mild irritation to severe pain that makes it difficult for you to walk or move. The pain is usually worse in the morning or after you have been sitting or lying down for a while. CAUSES This condition may be caused by:  Standing for long periods of time.  Wearing shoes that do not fit.  Doing  high-impact activities, including running, aerobics, and ballet.  Being overweight.  Having an abnormal way of walking (gait).  Having tight calf muscles.  Having high arches in your feet.  Starting a new athletic activity. SYMPTOMS The main symptom of this condition is heel pain. Other symptoms include:  Pain that gets worse after activity or exercise.  Pain that is worse in the morning or after resting.  Pain that goes away after you walk for a few minutes. DIAGNOSIS This condition may be diagnosed based on your signs and symptoms. Your health care provider will also do a physical exam to check for:  A tender area on the bottom of your foot.  A high arch in your foot.  Pain when you move your foot.  Difficulty moving your foot. You may also need to have imaging studies to confirm the diagnosis. These can include:  X-rays.  Ultrasound.  MRI. TREATMENT  Treatment for plantar fasciitis depends on the severity of the condition. Your treatment may include:  Rest, ice, and over-the-counter pain medicines to manage your pain.  Exercises to stretch your calves and your plantar fascia.  A splint that holds your foot in a stretched, upward position while you sleep (night splint).  Physical therapy to relieve symptoms and prevent problems in the future.  Cortisone injections to relieve severe pain.  Extracorporeal shock wave therapy (ESWT) to stimulate damaged plantar fascia with electrical impulses. It is often used as a last resort before surgery.  Surgery, if other treatments have not worked after 12 months. HOME CARE INSTRUCTIONS  Take medicines only as directed by your health care provider.  Avoid activities that cause pain.  Roll the bottom of your foot over a bag of ice or a bottle of cold water. Do this for 20 minutes, 3-4 times a day.  Perform simple stretches as directed by your health care provider.  Try wearing athletic shoes with air-sole or gel-sole  cushions or soft shoe inserts.  Wear a night splint while sleeping, if directed by your health care provider.  Keep all follow-up appointments with your health care provider. PREVENTION   Do not perform exercises or activities that cause heel pain.  Consider finding low-impact activities if you continue to have problems.  Lose weight if you need to. The best way to prevent plantar fasciitis is to avoid the activities that aggravate your plantar fascia. SEEK MEDICAL CARE IF:  Your symptoms do not go away after treatment with home care measures.  Your pain gets worse.  Your pain affects your ability to move or do your daily activities. This information is not intended to replace advice given to you by your health care provider. Make sure you discuss any questions you have with your health care provider. Document Released: 10/14/2000 Document Revised: 05/13/2015 Document Reviewed: 11/29/2013 Elsevier Interactive Patient Education  2017 [employer].

## 2016-03-11 NOTE — Progress Notes (Signed)
Patient is here for tingling in her feet from gout.  Patient complains of nausea and diarrhea over the past 4 days.  Patient has not taken any medication today. Patient has not eaten today.

## 2016-03-12 LAB — STOOL CELLS, WBC & RBC
RBC/40X FIELD:: 0
WBC/40X FIELD:: 0

## 2016-03-12 LAB — CLOSTRIDIUM DIFFICILE BY PCR: Toxigenic C. Difficile by PCR: NOT DETECTED

## 2016-03-12 LAB — OVA AND PARASITE EXAMINATION: OP: NONE SEEN

## 2016-03-16 ENCOUNTER — Telehealth: Payer: Self-pay | Admitting: *Deleted

## 2016-03-16 NOTE — Telephone Encounter (Signed)
-----   Message from Quentin Angstlugbemiga E Jegede, MD sent at 03/13/2016 11:00 AM EST ----- Lab test results are mostly normal, stool test is negative for infections.

## 2016-03-16 NOTE — Telephone Encounter (Signed)
Patient verified DOB Patient is aware of lab results being mostly normal and stool being negative for infections. Patient states the diarrhea has not subsided. Patient is aware of imodium being ordered to the [facility]. Patient expressed her understanding and had no further questions.

## 2016-03-23 ENCOUNTER — Other Ambulatory Visit: Payer: Self-pay | Admitting: Internal Medicine

## 2016-03-25 ENCOUNTER — Telehealth: Payer: Self-pay | Admitting: Internal Medicine

## 2016-03-25 NOTE — Telephone Encounter (Signed)
Von from [facility] calling on behalf of pt  Pt states PCP suggested pt get imodium over-the-counter to relieve stomach issues   Pt states the OTC price is too much for her and wants to know if a prior authorization can be placed for pt [facility]

## 2016-03-26 NOTE — Telephone Encounter (Signed)
Is there a PA we can complete for imodium?

## 2016-03-26 NOTE — Telephone Encounter (Signed)
MA spoke with Von from [facility] and informed her of medicaid not paying for a OTC medication. MA informed Von of the clinical pharmacist attempting to run the medication and it was not covered. Patient advised to search for online coupon in the mean time. No further questions at this time.

## 2016-03-26 NOTE — Telephone Encounter (Signed)
No, unfortunately, Medicaid will not cover anything that is over the counter. I tried just in case and it was denied since it is OTC. And they have nothing on the formulary for acute diarrhea, only treatment for chronic diarrhea for irritable bowel. She might be able to find some coupons online. Will defer to Dr. Hyman HopesJegede for any other suggestions.

## 2016-04-03 ENCOUNTER — Ambulatory Visit (INDEPENDENT_AMBULATORY_CARE_PROVIDER_SITE_OTHER): Payer: Self-pay

## 2016-04-03 ENCOUNTER — Encounter (INDEPENDENT_AMBULATORY_CARE_PROVIDER_SITE_OTHER): Payer: Self-pay | Admitting: Specialist

## 2016-04-03 ENCOUNTER — Ambulatory Visit (INDEPENDENT_AMBULATORY_CARE_PROVIDER_SITE_OTHER): Payer: Medicaid Other | Admitting: Specialist

## 2016-04-03 VITALS — BP 117/75 | HR 77 | Ht 66.0 in | Wt 148.0 lb

## 2016-04-03 DIAGNOSIS — M24661 Ankylosis, right knee: Secondary | ICD-10-CM

## 2016-04-03 DIAGNOSIS — M1711 Unilateral primary osteoarthritis, right knee: Secondary | ICD-10-CM | POA: Diagnosis not present

## 2016-04-03 MED ORDER — CYCLOBENZAPRINE HCL 5 MG PO TABS: 5.0000 mg | ORAL_TABLET | Freq: Every day | ORAL | 1 refills | Status: DC

## 2016-04-03 NOTE — Addendum Note (Signed)
Addended by: Vira BrownsNITKA, Andros Channing on: 04/03/2016 10:48 AM   Modules accepted: Orders

## 2016-04-03 NOTE — Patient Instructions (Addendum)
    Use the dynamic splint to promote improve ROM of the right knee. Adjust the turnbuckle as directed by PT to help stretch the scar tissue about the right TKR and regain motion. Call if fever or chills or increased drainage. Go to ER if acutely short of breath or call for ambulance.  May full weight bear on the surgical leg unless told otherwise.  I

## 2016-04-03 NOTE — Addendum Note (Signed)
Addended by: Vira BrownsNITKA, Ottie Neglia on: 04/03/2016 10:42 AM   Modules accepted: Orders

## 2016-04-03 NOTE — Progress Notes (Addendum)
Office Visit Note   Patient: Stacy [name]           Date of Birth: [birth year]           MRN: [id number] Visit Date: 04/03/2016              Requested by: Quentin Angstlugbemiga E Jegede, MD [address] [place], [place] [id number] PCP: Jeanann LewandowskyJEGEDE, OLUGBEMIGA, MD   Assessment & Plan: 6 months post right TKR with persistent decrease ROM, 15-70 degrees, using a dynamic brace to improve function. Visit Diagnoses:  EXAM: right knee with out effusion, ROM 15-70 degrees, no instability, scar is likely cause of decreased ROM.  Plan: Use the dynamic splint to promote improve ROM of the right knee. Adjust the turnbuckle as directed by PT to help stretch the scar tissue about the right TKR and regain motion. Call if fever or chills or increased drainage. Go to ER if acutely short of breath or call for ambulance.  May full weight bear on the surgical leg unless told otherwise.  I   Follow-Up Instructions: Return in about 4 weeks (around 05/01/2016).   Orders:  Orders Placed This Encounter  Procedures  . XR Knee 1-2 Views Right   No orders of the defined types were placed in this encounter.     Procedures: No procedures performed   Clinical Data: No additional findings.   Subjective: Chief Complaint  Patient presents with  . Right Knee - Follow-up  . Left Knee - Pain    Ms. Lamarca is here to follow up on her right knee. She briught her braces with her to see if they need adjusting, states that she has not been wearing them.  Over all doing okay.  She has some sharp pains in it some.  States that last night her left knee buckled on her.     Review of Systems  Constitutional: Negative.   HENT: Negative.   Eyes: Negative.   Respiratory: Negative.   Cardiovascular: Negative.   Gastrointestinal: Negative.   Endocrine: Negative.   Genitourinary: Negative.   Musculoskeletal: Negative.   Skin: Negative.   Allergic/Immunologic: Negative.   Neurological: Negative.   Hematological:  Negative.   Psychiatric/Behavioral: Negative.      Objective: Vital Signs: BP 117/75 (BP Location: Left Arm, Patient Position: Sitting)   Pulse 77   Ht 5\' 6"  (1.676 m)   Wt 148 lb (67.1 kg)   BMI 23.89 kg/m   Physical Exam  Constitutional: She is oriented to person, place, and time. She appears well-developed and well-nourished.  HENT:  Head: Normocephalic and atraumatic.  Eyes: EOM are normal. Pupils are equal, round, and reactive to light.  Neck: Normal range of motion. Neck supple.  Pulmonary/Chest: Effort normal and breath sounds normal.  Abdominal: Soft. Bowel sounds are normal.  Musculoskeletal: Normal range of motion.  Neurological: She is alert and oriented to person, place, and time.  Skin: Skin is warm and dry.  Psychiatric: She has a normal mood and affect. Her behavior is normal. Judgment and thought content normal.    Ortho Exam  Specialty Comments:  No specialty comments available.  Imaging: No results found.   PMFS History: Patient Active Problem List   Diagnosis Date Noted  . Neurogenic claudication due to lumbar spinal stenosis 06/03/2015    Priority: High    Class: Chronic  . Chondromalacia of both patellae 06/03/2015    Priority: High    Class: Chronic  . Gastroenteritis 03/11/2016  . postop Arthrofibrosis  of knee joint, right 11/29/2015  . Pap smear for cervical cancer screening 10/24/2015  . Healthcare maintenance 10/24/2015  . Osteoarthritis of right knee 09/06/2015  . Type 2 diabetes mellitus without complication, without long-term current use of insulin (HCC) 06/13/2015  . Spinal stenosis, lumbar region, with neurogenic claudication 06/03/2015  . Tobacco use disorder 04/25/2015  . DJD (degenerative joint disease) of knee 01/04/2015  . Falls frequently 11/29/2014  . Hemorrhoid 11/14/2014  . Bilateral knee pain 07/31/2014  . Asthma, chronic 07/19/2014  . Gout of big toe 07/19/2014  . Midline low back pain with right-sided sciatica  01/01/2014  . Bilateral low back pain without sciatica 08/14/2013  . Essential hypertension 08/14/2013  . Gastroesophageal reflux disease without esophagitis 08/14/2013  . COPD exacerbation (HCC) 08/14/2013  . Colon cancer screening 08/14/2013  . Low back pain 05/15/2013  . Preventative health care 05/15/2013  . GERD (gastroesophageal reflux disease) 02/16/2013  . DM (diabetes mellitus) (HCC) 02/16/2013  . COPD (chronic obstructive pulmonary disease) (HCC) 04/17/2011  . Hypertension 04/17/2011  . Cough 04/16/2011   Past Medical History:  Diagnosis Date  . Arthritis   . 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 Asthma   . COPD (chronic obstructive pulmonary disease) (HCC)   . Diabetes mellitus    Type II  . GERD (gastroesophageal reflux disease)    Pt on Protonix daily  . Gout   . Headache(784.0)    otc meds prn  . Hyperlipidemia   . Hypertension    Pt on lisinopril  . Irritable bowel syndrome 11/19/2010  . Neuropathy (HCC)   . Shortness of breath    occasional - uses breathing tx at home    Family History  Problem Relation Age of Onset  . Hypertension Father   . Cancer Father   . Asthma Son     had as a child  . Heart disease Sister   . Breast cancer Sister   . Hypertension Brother     Past Surgical History:  Procedure Laterality Date  . CHOLECYSTECTOMY    . ENDOMETRIAL ABLATION  10/2010  . HERNIA REPAIR     umbicial hernia  . KNEE CLOSED REDUCTION Right 12/06/2015   Procedure: CLOSED MANIPULATION RIGHT KNEE;  Surgeon: Kerrin Champagne, MD;  [facility]: [facility];  Service: Orthopedics;  Laterality: Right;  . KNEE CLOSED REDUCTION Right 01/17/2016   Procedure: CLOSED MANIPULATION RIGHT KNEE;  Surgeon: Kerrin Champagne, MD;  Location: [facility];  Service: Orthopedics;  Laterality: Right;  . KNEE JOINT MANIPULATION Right 12/06/2015  . LUMBAR DISC SURGERY  06/03/2015   L 2  L3 L4 L5   . LUMBAR LAMINECTOMY/DECOMPRESSION MICRODISCECTOMY N/A 06/03/2015   Procedure:  Bilateral lateral recess decompression L2-3, L3-4, L4-5;  Surgeon: Kerrin Champagne, MD;  Location: [facility];  Service: Orthopedics;  Laterality: N/A;  . svd      x 2  . TOTAL KNEE ARTHROPLASTY Right 09/06/2015   Procedure: RIGHT TOTAL KNEE ARTHROPLASTY;  Surgeon: Kerrin Champagne, MD;  Location: [facility];  Service: Orthopedics;  Laterality: Right;  . TUBAL LIGATION    . UPPER GASTROINTESTINAL ENDOSCOPY  04/28/11   Social History   Occupational History  . unemployed Unemployed   Social History Main Topics  . Smoking status: Current Every Day Smoker    Packs/day: 0.25    Years: 32.00    Types: Cigarettes  . Smokeless tobacco: Never Used  . Alcohol use No  . Drug use:  No  . Sexual activity: Yes    Birth control/ protection: Surgical

## 2016-04-07 ENCOUNTER — Encounter: Payer: Self-pay | Admitting: Podiatry

## 2016-04-07 ENCOUNTER — Ambulatory Visit (INDEPENDENT_AMBULATORY_CARE_PROVIDER_SITE_OTHER): Payer: Medicaid Other | Admitting: Podiatry

## 2016-04-07 ENCOUNTER — Ambulatory Visit: Payer: Medicaid Other | Admitting: Podiatry

## 2016-04-07 VITALS — BP 151/90 | HR 63 | Ht 66.0 in | Wt 148.0 lb

## 2016-04-07 DIAGNOSIS — E1142 Type 2 diabetes mellitus with diabetic polyneuropathy: Secondary | ICD-10-CM

## 2016-04-07 DIAGNOSIS — B351 Tinea unguium: Secondary | ICD-10-CM | POA: Diagnosis not present

## 2016-04-07 DIAGNOSIS — M79675 Pain in left toe(s): Secondary | ICD-10-CM

## 2016-04-07 DIAGNOSIS — M79674 Pain in right toe(s): Secondary | ICD-10-CM | POA: Diagnosis not present

## 2016-04-07 NOTE — Patient Instructions (Signed)

## 2016-04-07 NOTE — Progress Notes (Signed)
   Subjective:    Patient ID: Stacy [name], female    DOB: [birth year], 56 y.o.   MRN: [id number]  HPI  This patient presents today complaining of thickened and elongated toenails becoming gradually more uncomfortable when walking wearing shoes in the past 6-12 months. Patient has attempted to self trim the toenails, however, not able to trim toenails because of deformity within the toenails. She denies any recent podiatric care. Patient also planes of tingling in her toes for the past 6 months on and off weightbearing  Patient is diabetic since approximately 2011 and denies history of foot ulceration, claudication or amputation Patient is a current smoker  Patient also mentions she has a history of knee replacement causing her to use a cane and favors the right knee   Review of Systems  Constitutional: Positive for appetite change.  All other systems reviewed and are negative.      Objective:   Physical Exam  Orientated 3  Vascular: DP and PT pulses 2/4 bilaterally Capillary reflex immediate bilaterally No calf tenderness or peripheral edema bilaterally  Neurological: Sensation to 10 g monofilament wire intact 4/5 right and 3/5 left Vibratory sensation reactive bilaterally Ankle reflexes equal reactive bilaterally  Dermatological: No open skin lesions bilaterally Dry skin bilaterally The toenails are elongated, hypertrophic, deformed, discolored and tender direct palpation 6-10  Musculoskeletal: HAV bilaterally Hammertoe second bilaterally Manual motor testing dorsi flexion, plantar flexion, inversion, eversion 5/5 bilaterally Patient walks with the assistance of cane      Assessment & Plan:   Assessment: Satisfactory vascular status 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 We discussed treatment options for mycotic toenails  including repetitive debridement, oral medication. Patient opts for debridement  The toenails 6-10 are debrided mechanically and elected without any bleeding  Reappoint 3 months

## 2016-04-15 ENCOUNTER — Telehealth: Payer: Self-pay | Admitting: Internal Medicine

## 2016-04-15 NOTE — Telephone Encounter (Signed)
MA has faxed the medication list to [facility].

## 2016-04-15 NOTE — Telephone Encounter (Signed)
Pts case manager from [facility] calling on behalf of [facility]  Pt is in the process of switching her medications to [facility] to receive benefits such as medication delivery and they are are requesting a copy of pts current medication list.   [facility] fax: [phone] Attn: Eston EstersVishal

## 2016-04-21 ENCOUNTER — Ambulatory Visit: Payer: Medicaid Other | Admitting: Podiatry

## 2016-05-21 ENCOUNTER — Ambulatory Visit (INDEPENDENT_AMBULATORY_CARE_PROVIDER_SITE_OTHER): Payer: Medicaid Other | Admitting: Specialist

## 2016-05-21 ENCOUNTER — Encounter (INDEPENDENT_AMBULATORY_CARE_PROVIDER_SITE_OTHER): Payer: Self-pay | Admitting: Specialist

## 2016-05-21 VITALS — BP 121/80 | HR 83 | Ht 66.0 in | Wt 148.0 lb

## 2016-05-21 DIAGNOSIS — M1711 Unilateral primary osteoarthritis, right knee: Secondary | ICD-10-CM

## 2016-05-21 DIAGNOSIS — M1712 Unilateral primary osteoarthritis, left knee: Secondary | ICD-10-CM

## 2016-05-21 DIAGNOSIS — M24661 Ankylosis, right knee: Secondary | ICD-10-CM

## 2016-05-21 MED ORDER — OXYCODONE HCL 5 MG PO TABS: 5.0000 mg | ORAL_TABLET | Freq: Once | ORAL | 0 refills | Status: DC | PRN

## 2016-05-21 MED ORDER — CELECOXIB 200 MG PO CAPS: 200.0000 mg | ORAL_CAPSULE | Freq: Two times a day (BID) | ORAL | 6 refills | Status: DC
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
## 2016-05-21 NOTE — Patient Instructions (Addendum)
  Knee is suffering from osteoarthritis, only real proven treatments are Weight loss, NSIADs like celebrex  and exercise. Well padded shoes help. Ice the knee 2-3 times a day 15-20 mins at a time.

## 2016-05-26 ENCOUNTER — Other Ambulatory Visit: Payer: Self-pay | Admitting: Internal Medicine

## 2016-05-26 DIAGNOSIS — M109 Gout, unspecified: Secondary | ICD-10-CM

## 2016-06-10 ENCOUNTER — Encounter: Payer: Self-pay | Admitting: Internal Medicine

## 2016-06-10 ENCOUNTER — Ambulatory Visit: Payer: Medicaid Other | Attending: Internal Medicine | Admitting: Internal Medicine

## 2016-06-10 VITALS — BP 123/74 | HR 83 | Temp 98.4°F | Resp 18 | Ht 66.0 in | Wt 155.0 lb

## 2016-06-10 DIAGNOSIS — E785 Hyperlipidemia, unspecified: Secondary | ICD-10-CM | POA: Diagnosis not present

## 2016-06-10 DIAGNOSIS — Z888 Allergy status to other drugs, medicaments and biological substances status: Secondary | ICD-10-CM | POA: Insufficient documentation

## 2016-06-10 DIAGNOSIS — M1712 Unilateral primary osteoarthritis, left knee: Secondary | ICD-10-CM

## 2016-06-10 DIAGNOSIS — K589 Irritable bowel syndrome without diarrhea: Secondary | ICD-10-CM | POA: Insufficient documentation

## 2016-06-10 DIAGNOSIS — F17200 Nicotine dependence, unspecified, uncomplicated: Secondary | ICD-10-CM | POA: Insufficient documentation

## 2016-06-10 DIAGNOSIS — Z7984 Long term (current) use of oral hypoglycemic drugs: Secondary | ICD-10-CM | POA: Diagnosis not present

## 2016-06-10 DIAGNOSIS — M109 Gout, unspecified: Secondary | ICD-10-CM

## 2016-06-10 DIAGNOSIS — J441 Chronic obstructive pulmonary disease with (acute) exacerbation: Secondary | ICD-10-CM

## 2016-06-10 DIAGNOSIS — Z96651 Presence of right artificial knee joint: Secondary | ICD-10-CM | POA: Insufficient documentation

## 2016-06-10 DIAGNOSIS — M24661 Ankylosis, right knee: Secondary | ICD-10-CM

## 2016-06-10 DIAGNOSIS — K219 Gastro-esophageal reflux disease without esophagitis: Secondary | ICD-10-CM | POA: Diagnosis not present

## 2016-06-10 DIAGNOSIS — Z79899 Other long term (current) drug therapy: Secondary | ICD-10-CM | POA: Diagnosis not present

## 2016-06-10 DIAGNOSIS — Z886 Allergy status to analgesic agent status: Secondary | ICD-10-CM | POA: Diagnosis not present

## 2016-06-10 DIAGNOSIS — E119 Type 2 diabetes mellitus without complications: Secondary | ICD-10-CM

## 2016-06-10 DIAGNOSIS — I10 Essential (primary) hypertension: Secondary | ICD-10-CM

## 2016-06-10 LAB — POCT UA - MICROALBUMIN
Creatinine, POC: 200 mg/dL
Microalbumin Ur, POC: 80 mg/L

## 2016-06-10 LAB — POCT GLYCOSYLATED HEMOGLOBIN (HGB A1C): Hemoglobin A1C: 6.4

## 2016-06-10 LAB — GLUCOSE, POCT (MANUAL RESULT ENTRY): POC Glucose: 68 mg/dl — AB (ref 70–99)

## 2016-06-10 MED ORDER — METHOCARBAMOL 500 MG PO TABS: 500.0000 mg | ORAL_TABLET | Freq: Four times a day (QID) | ORAL | 1 refills | Status: DC | PRN

## 2016-06-10 MED ORDER — METFORMIN HCL ER 750 MG PO TB24: 750.0000 mg | ORAL_TABLET | Freq: Every day | ORAL | 3 refills | Status: DC

## 2016-06-10 MED ORDER — ACCU-CHEK AVIVA PLUS W/DEVICE KIT
1.0000 | PACK | Freq: Three times a day (TID) | 0 refills | Status: DC
Start: 2016-06-10 — End: 2016-06-10

## 2016-06-10 MED ORDER — HYDROXYZINE HCL 10 MG PO TABS: ORAL_TABLET | ORAL | 2 refills | Status: DC

## 2016-06-10 MED ORDER — ACETAMINOPHEN-CODEINE #3 300-30 MG PO TABS: 1.0000 | ORAL_TABLET | ORAL | 0 refills | Status: DC | PRN

## 2016-06-10 MED ORDER — MOMETASONE FURO-FORMOTEROL FUM 100-5 MCG/ACT IN AERO: 2.0000 | INHALATION_SPRAY | Freq: Two times a day (BID) | RESPIRATORY_TRACT | 3 refills | Status: DC

## 2016-06-10 MED ORDER — ACCU-CHEK SOFT TOUCH LANCETS MISC: 12 refills | Status: DC

## 2016-06-10 MED ORDER — HYDROCHLOROTHIAZIDE 25 MG PO TABS: 25.0000 mg | ORAL_TABLET | Freq: Every day | ORAL | 3 refills | Status: DC

## 2016-06-10 MED ORDER — GLUCOSE BLOOD VI STRP: ORAL_STRIP | 12 refills | Status: DC

## 2016-06-10 MED ORDER — VALSARTAN-HYDROCHLOROTHIAZIDE 160-12.5 MG PO TABS: 1.0000 | ORAL_TABLET | Freq: Every day | ORAL | 3 refills | Status: DC

## 2016-06-10 MED ORDER — PANTOPRAZOLE SODIUM 40 MG PO TBEC: 40.0000 mg | DELAYED_RELEASE_TABLET | Freq: Every day | ORAL | 3 refills | Status: DC

## 2016-06-10 MED ORDER — SIMVASTATIN 20 MG PO TABS: 20.0000 mg | ORAL_TABLET | Freq: Every day | ORAL | 3 refills | Status: DC

## 2016-06-10 MED ORDER — GABAPENTIN 300 MG PO CAPS: 300.0000 mg | ORAL_CAPSULE | Freq: Three times a day (TID) | ORAL | 3 refills | Status: DC

## 2016-06-10 MED ORDER — ACCU-CHEK AVIVA PLUS W/DEVICE KIT: 1.0000 | PACK | Freq: Three times a day (TID) | 0 refills | Status: DC

## 2016-06-10 MED ORDER — ALLOPURINOL 100 MG PO TABS: 100.0000 mg | ORAL_TABLET | Freq: Every day | ORAL | 3 refills | Status: DC

## 2016-06-10 MED ORDER — COLCHICINE 0.6 MG PO CAPS: 1.0000 | ORAL_CAPSULE | Freq: Every day | ORAL | 0 refills | Status: DC

## 2016-06-10 MED ORDER — RIVAROXABAN 10 MG PO TABS: 10.0000 mg | ORAL_TABLET | Freq: Every day | ORAL | 0 refills | Status: DC

## 2016-06-10 NOTE — Progress Notes (Signed)
Stacy [name], is a 56 y.o. female  TDD:[id number]  WCB:[id number]  DOB - [birth year]  Chief Complaint  Patient presents with  . Hypertension     Subjective:   Stacy [name] is a 56 y.o. female with history of hypertension, GERD, gouty arthritis, bilateral primary osteoarthritis of the knees, S/P TKR, type 2 diabetes mellitus controlled, hyperlipidemia, COPD with ongoing tobacco use and chronic back pain here today for routine follow up visit for diabetes and hypertension, and medication refills. Patient is not happy today because she is about to go through possible divorce or at best separation from her husband of many years who lately has indulged in some "extra marital affairs". Patient has developed anxiety over this issue and has increased rate of smoking. She still has some pain in her right knee joint after 2 surgeries/manipulations to relief scar tissues post initial knee replacement. But she says overall she feels better than before knee replacement. BP is controlled as well as BS. She is adherent with medications without side effect. She denies any suicidal ideation or thought. Patient has No headache, No chest pain, No abdominal pain - No Nausea, No new weakness tingling or numbness, No Cough - SOB.  No problems updated.  ALLERGIES: Allergies  Allergen Reactions  . Other Shortness Of Breath    UNSPECIFIED AGENTS Allergic to perfumes and cleaning products  . Ace Inhibitors Cough       . Aspirin Nausea Only    PAST MEDICAL HISTORY: Past Medical History:  Diagnosis Date  . Arthritis   . 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 Asthma   . COPD (chronic obstructive pulmonary disease) ([place])   . Diabetes mellitus    Type II  . GERD (gastroesophageal reflux disease)    Pt on Protonix daily  . Gout   . Headache(784.0)    otc meds prn  . Hyperlipidemia   . Hypertension    Pt on lisinopril  . Irritable bowel syndrome 11/19/2010  . Neuropathy   .  Shortness of breath    occasional - uses breathing tx at home    MEDICATIONS AT HOME: Prior to Admission medications   Medication Sig Start Date End Date Taking? Authorizing Provider  acetaminophen-codeine (TYLENOL #3) 300-30 MG tablet Take 1 tablet by mouth every 4 (four) hours as needed. 06/10/16  Yes Cerita Rabelo E, MD  albuterol (PROVENTIL) (2.5 MG/3ML) 0.083% nebulizer solution USE 1 VIAL VIA NEBULIZER EVERY 4 HOURS AS NEEDED FOR WHEEZING 11/01/15  Yes Cortney Mckinney E, MD  allopurinol (ZYLOPRIM) 100 MG tablet Take 1 tablet (100 mg total) by mouth daily. 06/10/16  Yes Tresa Garter, MD  Colchicine 0.6 MG CAPS Take 1 capsule by mouth daily. 06/10/16  Yes Tresa Garter, MD  cyclobenzaprine (FLEXERIL) 5 MG tablet Take 1 tablet (5 mg total) by mouth daily. 04/03/16  Yes [name], MD  EPINEPHrine 0.3 mg/0.3 mL IJ SOAJ injection Inject 0.3 mLs (0.3 mg total) into the muscle once. Patient taking differently: Inject 0.3 mg into the muscle daily as needed (for allergic reaction).  09/10/14  Yes Tresa Garter, MD  gabapentin (NEURONTIN) 300 MG capsule Take 1 capsule (300 mg total) by mouth 3 (three) times daily. 06/10/16  Yes Avelardo Reesman E, MD  glucose blood (TRUE METRIX BLOOD GLUCOSE TEST) test strip USE AS DIRECTED BY PHYSICIAN 06/10/16  Yes Damali Broadfoot E, MD  hydrochlorothiazide (HYDRODIURIL) 25 MG tablet Take 1 tablet (25 mg total) by  mouth daily. 06/10/16  Yes Tresa Garter, MD  hydrocortisone-pramoxine [facility]) 2.5-1 % rectal cream Place 1 application rectally 2 (two) times daily. Patient taking differently: Place 1 application rectally 2 (two) times daily as needed for hemorrhoids.  11/16/14  Yes Arnoldo Morale, MD  hydrOXYzine (ATARAX/VISTARIL) 10 MG tablet TAKE 1 TABLET BY MOUTH THREE TIMES DAILY AS NEEDED FOR ITCHING 06/10/16  Yes Tresa Garter, MD  Lancets (ACCU-CHEK SOFT TOUCH) lancets Use as instructed 06/10/16  Yes Jacorie Ernsberger E, MD    metFORMIN (GLUCOPHAGE-XR) 750 MG 24 hr tablet Take 1 tablet (750 mg total) by mouth daily with breakfast. 06/10/16  Yes Makayli Bracken E, MD  methocarbamol (ROBAXIN) 500 MG tablet Take 1 tablet (500 mg total) by mouth every 6 (six) hours as needed for muscle spasms. 06/10/16  Yes Kristine Tiley, Marlena Clipper, MD  mometasone-formoterol (DULERA) 100-5 MCG/ACT AERO Inhale 2 puffs into the lungs 2 (two) times daily. 06/10/16  Yes Tresa Garter, MD  Multiple Vitamin (MULTIVITAMIN WITH MINERALS) TABS tablet Take 1 tablet by mouth daily.   Yes [provider]  nicotine (NICODERM CQ - DOSED IN MG/24 HOURS) 14 mg/24hr patch Place 1 patch (14 mg total) onto the skin daily. 03/11/16  Yes Tresa Garter, MD  oxyCODONE (OXY IR/ROXICODONE) 5 MG immediate release tablet Take 1 tablet (5 mg total) by mouth once as needed (for pain score of 1-4). 05/21/16  Yes [name], MD  pantoprazole (PROTONIX) 40 MG tablet Take 1 tablet (40 mg total) by mouth daily. 06/10/16  Yes Tresa Garter, MD  potassium chloride (KLOR-CON) 20 MEQ packet Take by mouth once.   Yes [provider]  rivaroxaban (XARELTO) 10 MG TABS tablet Take 1 tablet (10 mg total) by mouth daily with breakfast. 06/10/16  Yes Frankie Zito E, MD  simvastatin (ZOCOR) 20 MG tablet Take 1 tablet (20 mg total) by mouth at bedtime. 06/10/16  Yes Tresa Garter, MD  SYMBICORT 80-4.5 MCG/ACT inhaler INHALE TWO PUFFS BY MOUTH TWICE A DAY 05/26/16  Yes Arna Luis E, MD  valsartan-hydrochlorothiazide (DIOVAN-HCT) 160-12.5 MG tablet Take 1 tablet by mouth daily. 06/10/16  Yes Tresa Garter, MD  Blood Glucose Monitoring Suppl (ACCU-CHEK AVIVA PLUS) w/Device KIT 1 each by Does not apply route 3 (three) times daily. 06/10/16   Tresa Garter, MD    Objective:   Vitals:   06/10/16 1152  BP: 123/74  Pulse: 83  Resp: 18  Temp: 98.4 F (36.9 C)  TempSrc: Oral  SpO2: 100%  Weight: 155 lb (70.3 kg)  Height: _0   (1.676 m)   Exam General appearance : Awake, alert, not in any distress. Speech Clear. Not toxic looking HEENT: Atraumatic and Normocephalic, pupils equally reactive to light and accomodation Neck: Supple, no JVD. No cervical lymphadenopathy.  Chest: Good air entry bilaterally, no added sounds  CVS: S1 S2 regular, no murmurs.  Abdomen: Bowel sounds present, Non tender and not distended with no gaurding, rigidity or rebound. Extremities: B/L Lower Ext shows no edema, both legs are warm to touch Neurology: Awake alert, and oriented X 3, CN II-XII intact, Non focal Skin: No Rash  Data Review Lab Results  Component Value Date   HGBA1C 6.4 06/10/2016   HGBA1C 6.0 03/11/2016   HGBA1C 6.3 01/16/2016    Assessment & Plan   1. Type 2 diabetes mellitus without complication, without long-term current use of insulin (HCC)  - POCT UA - Microalbumin - POCT A1C - Glucose (  CBG)  - simvastatin (ZOCOR) 20 MG tablet; Take 1 tablet (20 mg total) by mouth at bedtime.  Dispense: 90 tablet; Refill: 3 - metFORMIN (GLUCOPHAGE-XR) 750 MG 24 hr tablet; Take 1 tablet (750 mg total) by mouth daily with breakfast.  Dispense: 90 tablet; Refill: 3 - hydrOXYzine (ATARAX/VISTARIL) 10 MG tablet; TAKE 1 TABLET BY MOUTH THREE TIMES DAILY AS NEEDED FOR ITCHING  Dispense: 30 tablet; Refill: 2 - gabapentin (NEURONTIN) 300 MG capsule; Take 1 capsule (300 mg total) by mouth 3 (three) times daily.  Dispense: 270 capsule; Refill: 3  2. Gout of big toe  - rivaroxaban (XARELTO) 10 MG TABS tablet; Take 1 tablet (10 mg total) by mouth daily with breakfast.  Dispense: 30 tablet; Refill: 0 - Colchicine 0.6 MG CAPS; Take 1 capsule by mouth daily.  Dispense: 90 capsule; Refill: 0 - allopurinol (ZYLOPRIM) 100 MG tablet; Take 1 tablet (100 mg total) by mouth daily.  Dispense: 90 tablet; Refill: 3 - acetaminophen-codeine (TYLENOL #3) 300-30 MG tablet; Take 1 tablet by mouth every 4 (four) hours as needed.  Dispense: 90 tablet;  Refill: 0  3. Gastroesophageal reflux disease without esophagitis  - pantoprazole (PROTONIX) 40 MG tablet; Take 1 tablet (40 mg total) by mouth daily.  Dispense: 90 tablet; Refill: 3  4. COPD exacerbation ([place])  - mometasone-formoterol (DULERA) 100-5 MCG/ACT AERO; Inhale 2 puffs into the lungs 2 (two) times daily.  Dispense: 1 Inhaler; Refill: 3 - methocarbamol (ROBAXIN) 500 MG tablet; Take 1 tablet (500 mg total) by mouth every 6 (six) hours as needed for muscle spasms.  Dispense: 60 tablet; Refill: 1  5. Essential hypertension  - valsartan-hydrochlorothiazide (DIOVAN-HCT) 160-12.5 MG tablet; Take 1 tablet by mouth daily.  Dispense: 90 tablet; Refill: 3 - hydrochlorothiazide (HYDRODIURIL) 25 MG tablet; Take 1 tablet (25 mg total) by mouth daily.  Dispense: 90 tablet; Refill: 3  Laryah was counseled on the dangers of tobacco use, and was advised to quit. Reviewed strategies to maximize success, including removing cigarettes and smoking materials from environment, stress management and support of family/friends.  Patient have been counseled extensively about nutrition and exercise. Other issues discussed during this visit include: low cholesterol diet, weight control and daily exercise, foot care, annual eye examinations at Ophthalmology, importance of adherence with medications and regular follow-up. We also discussed long term complications of uncontrolled diabetes and hypertension.   Return in about 3 months (around 09/10/2016) for Hemoglobin A1C and Follow up, DM, Follow up HTN, Follow up Pain and comorbidities.  The patient was given clear instructions to go to ER or return to medical center if symptoms don't improve, worsen or new problems develop. The patient verbalized understanding. The patient was told to call to get lab results if they haven't heard anything in the next week.   This note has been created with Surveyor, quantity. Any  transcriptional errors are unintentional.    Angelica Chessman, MD, [place], [place], [place], [place] and [place], [place]   06/10/2016, 12:10 PM

## 2016-06-10 NOTE — Patient Instructions (Signed)

## 2016-06-10 NOTE — Progress Notes (Signed)
Patient is here for FU DM-HTN  Patient complains of left knee pain being present and scaled at an 8.  Patient has not taken medication today. Patient has not eaten today.

## 2016-06-24 LAB — GLUCOSE, POCT (MANUAL RESULT ENTRY): POC Glucose: 107 mg/dl — AB (ref 70–99)

## 2016-06-26 ENCOUNTER — Other Ambulatory Visit: Payer: Self-pay | Admitting: Internal Medicine

## 2016-07-08 ENCOUNTER — Encounter: Payer: Self-pay | Admitting: Podiatry

## 2016-07-08 ENCOUNTER — Ambulatory Visit (INDEPENDENT_AMBULATORY_CARE_PROVIDER_SITE_OTHER): Payer: Medicaid Other | Admitting: Podiatry

## 2016-07-08 DIAGNOSIS — M79674 Pain in right toe(s): Secondary | ICD-10-CM

## 2016-07-08 DIAGNOSIS — M79676 Pain in unspecified toe(s): Secondary | ICD-10-CM

## 2016-07-08 DIAGNOSIS — B351 Tinea unguium: Secondary | ICD-10-CM | POA: Diagnosis not present

## 2016-07-08 DIAGNOSIS — E1142 Type 2 diabetes mellitus with diabetic polyneuropathy: Secondary | ICD-10-CM

## 2016-07-08 DIAGNOSIS — M79675 Pain in left toe(s): Secondary | ICD-10-CM

## 2016-07-08 NOTE — Progress Notes (Signed)
Patient ID: Stacy [name], female   DOB: [birth year], 56 y.o.   MRN: [id number]   Subjective: This patient presents today for scheduled visit complaining of uncomfortable thickened toenails and requests toenail debridement.  Patient is diabetic since approximately 2011 and denies history of foot ulceration, claudication or amputation Patient is a current smoker  Objective: Orientated 3  Vascular: DP and PT pulses 2/4 bilaterally Capillary reflex immediate bilaterally No calf tenderness or peripheral edema bilaterally  Neurological: Sensation to 10 g monofilament wire intact 4/5 right and 3/5 left Vibratory sensation reactive bilaterally Ankle reflexes equal reactive bilaterally  Dermatological: No open skin lesions bilaterally Dry skin bilaterally The toenails are elongated, hypertrophic, deformed, discolored and tender direct palpation 6-10  Musculoskeletal: HAV bilaterally Hammertoe second bilaterally Manual motor testing dorsi flexion, plantar flexion, inversion, eversion 5/5 bilaterally Patient walks with the assistance of cane  Assessment: Satisfactory vascular status Diabetic peripheral neuropathy Symptomatic mycotic toenails 6-10 HAV bilaterally Hammertoe second bilaterally  Plan: IThe toenails 6-10 are debrided mechanically and elected without any bleeding  Reappoint 3 months

## 2016-07-08 NOTE — Patient Instructions (Signed)

## 2016-07-30 ENCOUNTER — Telehealth: Payer: Self-pay | Admitting: Internal Medicine

## 2016-07-30 NOTE — Telephone Encounter (Signed)
[facility] for pt, since she having issue with her med, she need to talk to a nurse about, please follow up

## 2016-08-19 ENCOUNTER — Ambulatory Visit: Payer: Medicaid Other | Attending: Internal Medicine | Admitting: Internal Medicine

## 2016-08-19 VITALS — Ht 68.0 in | Wt 154.0 lb

## 2016-08-19 DIAGNOSIS — Z96659 Presence of unspecified artificial knee joint: Secondary | ICD-10-CM | POA: Diagnosis not present

## 2016-08-19 DIAGNOSIS — Z79899 Other long term (current) drug therapy: Secondary | ICD-10-CM | POA: Insufficient documentation

## 2016-08-19 DIAGNOSIS — K219 Gastro-esophageal reflux disease without esophagitis: Secondary | ICD-10-CM

## 2016-08-19 DIAGNOSIS — I10 Essential (primary) hypertension: Secondary | ICD-10-CM

## 2016-08-19 DIAGNOSIS — E119 Type 2 diabetes mellitus without complications: Secondary | ICD-10-CM | POA: Diagnosis not present

## 2016-08-19 DIAGNOSIS — G8929 Other chronic pain: Secondary | ICD-10-CM | POA: Insufficient documentation

## 2016-08-19 DIAGNOSIS — E785 Hyperlipidemia, unspecified: Secondary | ICD-10-CM | POA: Diagnosis not present

## 2016-08-19 DIAGNOSIS — Z7984 Long term (current) use of oral hypoglycemic drugs: Secondary | ICD-10-CM | POA: Insufficient documentation

## 2016-08-19 DIAGNOSIS — M109 Gout, unspecified: Secondary | ICD-10-CM | POA: Diagnosis not present

## 2016-08-19 DIAGNOSIS — R062 Wheezing: Secondary | ICD-10-CM | POA: Insufficient documentation

## 2016-08-19 DIAGNOSIS — Z1211 Encounter for screening for malignant neoplasm of colon: Secondary | ICD-10-CM | POA: Diagnosis not present

## 2016-08-19 DIAGNOSIS — Z720 Tobacco use: Secondary | ICD-10-CM | POA: Insufficient documentation

## 2016-08-19 DIAGNOSIS — J441 Chronic obstructive pulmonary disease with (acute) exacerbation: Secondary | ICD-10-CM

## 2016-08-19 DIAGNOSIS — Z96651 Presence of right artificial knee joint: Secondary | ICD-10-CM

## 2016-08-19 DIAGNOSIS — L639 Alopecia areata, unspecified: Secondary | ICD-10-CM

## 2016-08-19 DIAGNOSIS — F17200 Nicotine dependence, unspecified, uncomplicated: Secondary | ICD-10-CM

## 2016-08-19 LAB — GLUCOSE, POCT (MANUAL RESULT ENTRY): POC Glucose: 90 mg/dl (ref 70–99)

## 2016-08-19 MED ORDER — VALSARTAN-HYDROCHLOROTHIAZIDE 160-12.5 MG PO TABS: 1.0000 | ORAL_TABLET | Freq: Every day | ORAL | 3 refills | Status: DC

## 2016-08-19 MED ORDER — SIMVASTATIN 20 MG PO TABS: 20.0000 mg | ORAL_TABLET | Freq: Every day | ORAL | 3 refills | Status: DC

## 2016-08-19 MED ORDER — METFORMIN HCL ER 750 MG PO TB24: 750.0000 mg | ORAL_TABLET | Freq: Every day | ORAL | 3 refills | Status: DC

## 2016-08-19 MED ORDER — ALLOPURINOL 100 MG PO TABS: 100.0000 mg | ORAL_TABLET | Freq: Every day | ORAL | 3 refills | Status: DC

## 2016-08-19 MED ORDER — COLCHICINE 0.6 MG PO CAPS: 1.0000 | ORAL_CAPSULE | Freq: Every day | ORAL | 3 refills | Status: DC

## 2016-08-19 MED ORDER — PANTOPRAZOLE SODIUM 40 MG PO TBEC: 40.0000 mg | DELAYED_RELEASE_TABLET | Freq: Every day | ORAL | 3 refills | Status: DC

## 2016-08-19 MED ORDER — POTASSIUM CHLORIDE CRYS ER 20 MEQ PO TBCR: 20.0000 meq | EXTENDED_RELEASE_TABLET | Freq: Every day | ORAL | 3 refills | Status: DC

## 2016-08-19 MED ORDER — METHOCARBAMOL 500 MG PO TABS: 500.0000 mg | ORAL_TABLET | Freq: Four times a day (QID) | ORAL | 1 refills | Status: DC | PRN

## 2016-08-19 MED ORDER — ACETAMINOPHEN-CODEINE #3 300-30 MG PO TABS: 1.0000 | ORAL_TABLET | ORAL | 0 refills | Status: DC | PRN

## 2016-08-19 MED ORDER — HYDROCHLOROTHIAZIDE 25 MG PO TABS: 25.0000 mg | ORAL_TABLET | Freq: Every day | ORAL | 3 refills | Status: DC

## 2016-08-19 MED ORDER — GABAPENTIN 300 MG PO CAPS: 300.0000 mg | ORAL_CAPSULE | Freq: Three times a day (TID) | ORAL | 3 refills | Status: DC

## 2016-08-19 NOTE — Progress Notes (Signed)
Stacy [name], is a 56 y.o. female  UJW:[id number]  FAO:[id number]  DOB - 1960-11-01  Chief Complaint  Patient presents with  . Pain      Subjective:   Stacy [name] is a 56 y.o. female with history of hypertension, GERD, gouty arthritis, bilateral primary osteoarthritis of the knees, S/P TKR, type 2 diabetes mellitus controlled, hyperlipidemia, COPD with ongoing tobacco use and chronic back pain here today for a follow up visit and medication refills. She is here today with her social worker from [facility] and her granddaughter. Her major complaint is her right knee which still gives in and made susceptible to frequent fall. Patient is also complaining of her hair loss which is ongoing and probably getting worse partly due to her stress condition. She requests dermatology referral. She is due for Colonoscopy. She denies any suicidal thoughts or ideation. Patient has No headache, No chest pain, No abdominal pain - No Nausea, No new weakness tingling or numbness, No Cough - SOB.  Problem  Alopecia [place]   ALLERGIES: Allergies  Allergen Reactions  . Other Shortness Of Breath    UNSPECIFIED AGENTS Allergic to perfumes and cleaning products  . Ace Inhibitors Cough       . Aspirin Nausea Only   PAST MEDICAL HISTORY: Past Medical History:  Diagnosis Date  . Arthritis   . 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 Asthma   . COPD (chronic obstructive pulmonary disease) ([place])   . Diabetes mellitus    Type II  . GERD (gastroesophageal reflux disease)    Pt on Protonix daily  . Gout   . Headache(784.0)    otc meds prn  . Hyperlipidemia   . Hypertension    Pt on lisinopril  . Irritable bowel syndrome 11/19/2010  . Neuropathy   . Shortness of breath    occasional - uses breathing tx at home    MEDICATIONS AT HOME: Prior to Admission medications   Medication Sig Start Date End Date Taking? Authorizing Provider  acetaminophen-codeine (TYLENOL #3) 300-30 MG  tablet Take 1 tablet by mouth every 4 (four) hours as needed. 08/19/16   Tresa Garter, MD  albuterol (PROVENTIL) (2.5 MG/3ML) 0.083% nebulizer solution USE 1 VIAL VIA NEBULIZER EVERY 4 HOURS AS NEEDED FOR WHEEZING 11/01/15   Tresa Garter, MD  allopurinol (ZYLOPRIM) 100 MG tablet Take 1 tablet (100 mg total) by mouth daily. 08/19/16   Tresa Garter, MD  Blood Glucose Monitoring Suppl (ACCU-CHEK AVIVA PLUS) w/Device KIT 1 each by Does not apply route 3 (three) times daily. 06/10/16   Tresa Garter, MD  Colchicine 0.6 MG CAPS Take 1 capsule by mouth daily. 08/19/16   Tresa Garter, MD  cyclobenzaprine (FLEXERIL) 5 MG tablet Take 1 tablet (5 mg total) by mouth daily. 04/03/16   [name], MD  EPINEPHrine 0.3 mg/0.3 mL IJ SOAJ injection Inject 0.3 mLs (0.3 mg total) into the muscle once. Patient taking differently: Inject 0.3 mg into the muscle daily as needed (for allergic reaction).  09/10/14   Tresa Garter, MD  gabapentin (NEURONTIN) 300 MG capsule Take 1 capsule (300 mg total) by mouth 3 (three) times daily. 08/19/16   Tresa Garter, MD  glucose blood (TRUE METRIX BLOOD GLUCOSE TEST) test strip USE AS DIRECTED BY PHYSICIAN 06/10/16   Tresa Garter, MD  hydrochlorothiazide (HYDRODIURIL) 25 MG tablet Take 1 tablet (25 mg total) by mouth daily. 08/19/16   Angelica Chessman  E, MD  hydrocortisone-pramoxine (ANALPRAM-HC) 2.5-1 % rectal cream Place 1 application rectally 2 (two) times daily. Patient taking differently: Place 1 application rectally 2 (two) times daily as needed for hemorrhoids.  11/16/14   Arnoldo Morale, MD  hydrOXYzine (ATARAX/VISTARIL) 10 MG tablet TAKE 1 TABLET BY MOUTH THREE TIMES DAILY AS NEEDED FOR ITCHING 06/10/16   Tresa Garter, MD  Lancets (ACCU-CHEK SOFT TOUCH) lancets Use as instructed 06/10/16   Tresa Garter, MD  metFORMIN (GLUCOPHAGE-XR) 750 MG 24 hr tablet Take 1 tablet (750 mg total) by mouth daily with breakfast.  08/19/16   Tresa Garter, MD  methocarbamol (ROBAXIN) 500 MG tablet Take 1 tablet (500 mg total) by mouth every 6 (six) hours as needed for muscle spasms. 08/19/16   Tresa Garter, MD  mometasone-formoterol (DULERA) 100-5 MCG/ACT AERO Inhale 2 puffs into the lungs 2 (two) times daily. 06/10/16   Tresa Garter, MD  Multiple Vitamin (MULTIVITAMIN WITH MINERALS) TABS tablet Take 1 tablet by mouth daily.    [provider]  nicotine (NICODERM CQ - DOSED IN MG/24 HOURS) 14 mg/24hr patch Place 1 patch (14 mg total) onto the skin daily. 03/11/16   Tresa Garter, MD  pantoprazole (PROTONIX) 40 MG tablet Take 1 tablet (40 mg total) by mouth daily. 08/19/16   Tresa Garter, MD  potassium chloride (KLOR-CON) 20 MEQ packet Take by mouth once.    [provider]  potassium chloride SA (K-DUR,KLOR-CON) 20 MEQ tablet Take 1 tablet (20 mEq total) by mouth daily. 08/19/16   Tresa Garter, MD  simvastatin (ZOCOR) 20 MG tablet Take 1 tablet (20 mg total) by mouth at bedtime. 08/19/16   Tresa Garter, MD  SYMBICORT 80-4.5 MCG/ACT inhaler INHALE TWO PUFFS BY MOUTH TWICE A DAY 06/26/16   Tresa Garter, MD  valsartan-hydrochlorothiazide (DIOVAN-HCT) 160-12.5 MG tablet Take 1 tablet by mouth daily. 08/19/16   Tresa Garter, MD    Objective:   Vitals:   08/19/16 1043  Weight: 154 lb (69.9 kg)  Height: 5' 8"  (1.727 m)   Exam General appearance : Awake, alert, not in any distress. Speech Clear. Not toxic looking HEENT: Atraumatic and Normocephalic, pupils equally reactive to light and accomodation Neck: Supple, no JVD. No cervical lymphadenopathy.  Chest: Good air entry bilaterally, no added sounds  CVS: S1 S2 regular, no murmurs.  Abdomen: Bowel sounds present, Non tender and not distended with no gaurding, rigidity or rebound. Extremities: B/L Lower Ext shows no edema, both legs are warm to touch Neurology: Awake alert, and oriented X 3, CN  II-XII intact, Non focal Skin: No Rash  Data Review Lab Results  Component Value Date   HGBA1C 6.4 06/10/2016   HGBA1C 6.0 03/11/2016   HGBA1C 6.3 01/16/2016    Assessment & Plan   1. Type 2 diabetes mellitus without complication, without long-term current use of insulin (HCC)  - Glucose (CBG) - gabapentin (NEURONTIN) 300 MG capsule; Take 1 capsule (300 mg total) by mouth 3 (three) times daily.  Dispense: 270 capsule; Refill: 3 - metFORMIN (GLUCOPHAGE-XR) 750 MG 24 hr tablet; Take 1 tablet (750 mg total) by mouth daily with breakfast.  Dispense: 90 tablet; Refill: 3 - simvastatin (ZOCOR) 20 MG tablet; Take 1 tablet (20 mg total) by mouth at bedtime.  Dispense: 90 tablet; Refill: 3  - CMP14+EGFR - VITAMIN D 25 Hydroxy (Vit-D Deficiency, Fractures) - Lipid panel  2. Gout of big toe  - acetaminophen-codeine (TYLENOL #3) 300-30  MG tablet; Take 1 tablet by mouth every 4 (four) hours as needed.  Dispense: 90 tablet; Refill: 0 - allopurinol (ZYLOPRIM) 100 MG tablet; Take 1 tablet (100 mg total) by mouth daily.  Dispense: 90 tablet; Refill: 3 - Colchicine 0.6 MG CAPS; Take 1 capsule by mouth daily.  Dispense: 90 capsule; Refill: 3  3. Essential hypertension  - hydrochlorothiazide (HYDRODIURIL) 25 MG tablet; Take 1 tablet (25 mg total) by mouth daily.  Dispense: 90 tablet; Refill: 3 - valsartan-hydrochlorothiazide (DIOVAN-HCT) 160-12.5 MG tablet; Take 1 tablet by mouth daily.  Dispense: 90 tablet; Refill: 3 - potassium chloride SA (K-DUR,KLOR-CON) 20 MEQ tablet; Take 1 tablet (20 mEq total) by mouth daily.  Dispense: 30 tablet; Refill: 3  4. COPD exacerbation (HCC)  - methocarbamol (ROBAXIN) 500 MG tablet; Take 1 tablet (500 mg total) by mouth every 6 (six) hours as needed for muscle spasms.  Dispense: 60 tablet; Refill: 1  5. Gastroesophageal reflux disease without esophagitis  - pantoprazole (PROTONIX) 40 MG tablet; Take 1 tablet (40 mg total) by mouth daily.  Dispense: 90 tablet;  Refill: 3  6. Tobacco use disorder  Stacy [name] was counseled on the dangers of tobacco use, and was advised to quit. Reviewed strategies to maximize success, including removing cigarettes and smoking materials from environment, stress management and support of family/friends.  7. Alopecia areata  - Ambulatory referral to Dermatology  8. Colon cancer screening  - Ambulatory referral to Gastroenterology  Patient have been counseled extensively about nutrition and exercise. Other issues discussed during this visit include: low cholesterol diet, weight control and daily exercise, foot care, annual eye examinations at Ophthalmology, importance of adherence with medications and regular follow-up. We also discussed long term complications of uncontrolled diabetes and hypertension.   Return in about 3 months (around 11/19/2016) for Hemoglobin A1C and Follow up, DM, Follow up HTN, Follow up Pain and comorbidities.  The patient was given clear instructions to go to ER or return to medical center if symptoms don't improve, worsen or new problems develop. The patient verbalized understanding. The patient was told to call to get lab results if they haven't heard anything in the next week.   This note has been created with Surveyor, quantity. Any transcriptional errors are unintentional.    Angelica Chessman, MD, [place], [place], [place], [place] and [place], [place]   08/19/2016, 12:11 PM

## 2016-08-19 NOTE — Patient Instructions (Signed)

## 2016-08-20 ENCOUNTER — Encounter (INDEPENDENT_AMBULATORY_CARE_PROVIDER_SITE_OTHER): Payer: Self-pay | Admitting: Specialist

## 2016-08-20 ENCOUNTER — Ambulatory Visit (INDEPENDENT_AMBULATORY_CARE_PROVIDER_SITE_OTHER): Payer: Medicaid Other | Admitting: Specialist

## 2016-08-20 ENCOUNTER — Ambulatory Visit (INDEPENDENT_AMBULATORY_CARE_PROVIDER_SITE_OTHER): Payer: Medicaid Other

## 2016-08-20 VITALS — BP 116/71 | HR 80 | Ht 66.0 in | Wt 148.0 lb

## 2016-08-20 DIAGNOSIS — M25561 Pain in right knee: Secondary | ICD-10-CM | POA: Diagnosis not present

## 2016-08-20 DIAGNOSIS — M25562 Pain in left knee: Secondary | ICD-10-CM | POA: Diagnosis not present

## 2016-08-20 DIAGNOSIS — M24661 Ankylosis, right knee: Secondary | ICD-10-CM | POA: Diagnosis not present

## 2016-08-20 DIAGNOSIS — M5416 Radiculopathy, lumbar region: Secondary | ICD-10-CM | POA: Diagnosis not present

## 2016-08-20 DIAGNOSIS — Z96651 Presence of right artificial knee joint: Secondary | ICD-10-CM

## 2016-08-20 DIAGNOSIS — G8929 Other chronic pain: Secondary | ICD-10-CM

## 2016-08-20 NOTE — Progress Notes (Signed)
Office Visit Note   Patient: Stacy [name]           Date of Birth: 1961-01-03           MRN: [id number] Visit Date: 08/20/2016              Requested by: Quentin Angst, MD [address] [place], [place] [address] PCP: Quentin Angst, MD   Assessment & Plan: Visit Diagnoses:  1. Radiculopathy, lumbar region   2. Chronic pain of left knee   3. Chronic pain of right knee   4. Arthrofibrosis of knee joint, right   5. Status post total right knee replacement     Plan: With patient's worsening right lower extremity radiculopathy and question feeling of left leg weakness I will schedule lumbar spine MRI with contrast to rule out HNP/stenosis. Follow up in office after completion to discuss results and further treatment options. Regards to her left knee we did discuss possibly trying conservative management with visco supplementation. We'll rediscuss when she returns to review MRI scan.  Follow-Up Instructions: Return in about 2 weeks (around 09/03/2016) for Review lumbar spine MRI with Dr [name].   Orders:  Orders Placed This Encounter  Procedures  . XR Lumbar Spine Complete  . MR LUMBAR SPINE W CONTRAST   No orders of the defined types were placed in this encounter.     Procedures: No procedures performed   Clinical Data: No additional findings.   Subjective: Chief Complaint  Patient presents with  . Right Knee - Follow-up  . Left Knee - Follow-up  . Right Ankle - Edema    HPI Patient returns. Status post bilateral lateral recess decompression L2-3 L3-4 and L4-07 Jun 2015, status post right total knee replacement August 2017 and one on the have close right knee manipulation under anesthesia November 2017 and December 2017. Patient states that over the last couple of months she's been having worsening pain numbness and tingling in the right leg from her buttock down to her foot. She is also been having episodes of her left leg "giving away". This happens  after she's been standing for short period. Continues to have pain and stiffness in the right knee along with some swelling. Pain also in the left knee where she has known chondromalacia. She's had left knee intra-articular Marcaine/Depo-Medrol injections in the past without much improvement. Has not had Visco supplementation. She has been shifting most of her weight onto the left leg due to worsening problems the right side. She does state that her left knee is sore when her leg gives away. She also plans of bilateral ankle swelling but is not really having associated pain with that. Review of Systems No current cardiac pulmonary GI GU issues.  Objective: Vital Signs: BP 116/71 (BP Location: Left Arm, Patient Position: Sitting)   Pulse 80   Ht 5\' 6"  (1.676 m)   Wt 148 lb (67.1 kg)   BMI 23.89 kg/m   Physical Exam  Constitutional: She is oriented to person, place, and time. No distress.  HENT:  Head: Normocephalic and atraumatic.  Eyes: Pupils are equal, round, and reactive to light. EOM are normal.  Neck: Normal range of motion.  Pulmonary/Chest: No respiratory distress.  Musculoskeletal:  Gait is antalgic. Negative logroll bilateral hips. Positive right straight leg raise. Right knee range of motion about 5-95. She does have some swelling with maybe small effusion. Left knee joint line tender. Mild patellofemoral crepitus. Bilateral calves nontender. Bilateral ankles minimally  swollen. Ankles are nontender. Right Ankle ligaments are stable. No gross weakness.  Neurological: She is alert and oriented to person, place, and time.  Skin: Skin is warm and dry.  Psychiatric: She has a normal mood and affect.    Ortho Exam  Specialty Comments:  No specialty comments available.  Imaging: No results found.   PMFS History: Patient Active Problem List   Diagnosis Date Noted  . Alopecia areata 08/19/2016  . Gastroenteritis 03/11/2016  . postop Arthrofibrosis of knee joint, right  11/29/2015  . Pap smear for cervical cancer screening 10/24/2015  . Healthcare maintenance 10/24/2015  . Osteoarthritis of right knee 09/06/2015  . Type 2 diabetes mellitus without complication, without long-term current use of insulin (HCC) 06/13/2015  . Neurogenic claudication due to lumbar spinal stenosis 06/03/2015    Class: Chronic  . Chondromalacia of both patellae 06/03/2015    Class: Chronic  . Spinal stenosis, lumbar region, with neurogenic claudication 06/03/2015  . Tobacco use disorder 04/25/2015  . DJD (degenerative joint disease) of knee 01/04/2015  . Falls frequently 11/29/2014  . Hemorrhoid 11/14/2014  . Bilateral knee pain 07/31/2014  . Asthma, chronic 07/19/2014  . Gout of big toe 07/19/2014  . Midline low back pain with right-sided sciatica 01/01/2014  . Bilateral low back pain without sciatica 08/14/2013  . Essential hypertension 08/14/2013  . Gastroesophageal reflux disease without esophagitis 08/14/2013  . COPD exacerbation (HCC) 08/14/2013  . Colon cancer screening 08/14/2013  . Low back pain 05/15/2013  . Preventative health care 05/15/2013  . GERD (gastroesophageal reflux disease) 02/16/2013  . DM (diabetes mellitus) (HCC) 02/16/2013  . COPD (chronic obstructive pulmonary disease) (HCC) 04/17/2011  . Hypertension 04/17/2011  . Cough 04/16/2011   Past Medical History:  Diagnosis Date  . Arthritis   . 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 Asthma   . COPD (chronic obstructive pulmonary disease) (HCC)   . Diabetes mellitus    Type II  . GERD (gastroesophageal reflux disease)    Pt on Protonix daily  . Gout   . Headache(784.0)    otc meds prn  . Hyperlipidemia   . Hypertension    Pt on lisinopril  . Irritable bowel syndrome 11/19/2010  . Neuropathy   . Shortness of breath    occasional - uses breathing tx at home    Family History  Problem Relation Age of Onset  . Hypertension Father   . Cancer Father   . Asthma Son         had as a child  . Heart disease Sister   . Breast cancer Sister   . Hypertension Brother     Past Surgical History:  Procedure Laterality Date  . CHOLECYSTECTOMY    . ENDOMETRIAL ABLATION  10/2010  . HERNIA REPAIR     umbicial hernia  . KNEE CLOSED REDUCTION Right 12/06/2015   Procedure: CLOSED MANIPULATION RIGHT KNEE;  Surgeon: Kerrin ChampagneJames E Nitka, MD;  Location: [facility];  Service: Orthopedics;  Laterality: Right;  . KNEE CLOSED REDUCTION Right 01/17/2016   Procedure: CLOSED MANIPULATION RIGHT KNEE;  Surgeon: Kerrin ChampagneJames E Nitka, MD;  Location: [facility] OR;  Service: Orthopedics;  Laterality: Right;  . KNEE JOINT MANIPULATION Right 12/06/2015  . LUMBAR DISC SURGERY  06/03/2015   L 2  L3 L4 L5   . LUMBAR LAMINECTOMY/DECOMPRESSION MICRODISCECTOMY N/A 06/03/2015   Procedure: Bilateral lateral recess decompression L2-3, L3-4, L4-5;  Surgeon: Kerrin ChampagneJames E Nitka, MD;  Location: [facility];  Service:  Orthopedics;  Laterality: N/A;  . svd      x 2  . TOTAL KNEE ARTHROPLASTY Right 09/06/2015   Procedure: RIGHT TOTAL KNEE ARTHROPLASTY;  Surgeon: Kerrin Champagne, MD;  Location: [facility];  Service: Orthopedics;  Laterality: Right;  . TUBAL LIGATION    . UPPER GASTROINTESTINAL ENDOSCOPY  04/28/11   Social History   Occupational History  . unemployed Unemployed   Social History Main Topics  . Smoking status: Current Every Day Smoker    Packs/day: 0.25    Years: 32.00    Types: Cigarettes  . Smokeless tobacco: Never Used  . Alcohol use No  . Drug use: No  . Sexual activity: Yes    Birth control/ protection: Surgical

## 2016-08-21 LAB — CMP14+EGFR
ALT: 21 IU/L (ref 0–32)
AST: 31 IU/L (ref 0–40)
Albumin/Globulin Ratio: 1.7 (ref 1.2–2.2)
Albumin: 4.5 g/dL (ref 3.5–5.5)
Alkaline Phosphatase: 87 IU/L (ref 39–117)
BUN/Creatinine Ratio: 15 (ref 9–23)
BUN: 12 mg/dL (ref 6–24)
Bilirubin Total: 0.2 mg/dL (ref 0.0–1.2)
CO2: 24 mmol/L (ref 20–29)
Calcium: 9.8 mg/dL (ref 8.7–10.2)
Chloride: 101 mmol/L (ref 96–106)
Creatinine, Ser: 0.79 mg/dL (ref 0.57–1.00)
GFR calc Af Amer: 97 mL/min/{1.73_m2} (ref >59)
GFR calc non Af Amer: 84 mL/min/{1.73_m2} (ref >59)
Globulin, Total: 2.6 g/dL (ref 1.5–4.5)
Glucose: 99 mg/dL (ref 65–99)
Potassium: 4.1 mmol/L (ref 3.5–5.2)
Sodium: 142 mmol/L (ref 134–144)
Total Protein: 7.1 g/dL (ref 6.0–8.5)

## 2016-08-21 LAB — VITAMIN D 25 HYDROXY (VIT D DEFICIENCY, FRACTURES): Vit D, 25-Hydroxy: 24.1 ng/mL — ABNORMAL LOW (ref 30.0–100.0)

## 2016-08-21 LAB — LIPID PANEL
Chol/HDL Ratio: 3.7 ratio (ref 0.0–4.4)
Cholesterol, Total: 194 mg/dL (ref 100–199)
HDL: 52 mg/dL (ref >39)
LDL Calculated: 108 mg/dL — ABNORMAL HIGH (ref 0–99)
Triglycerides: 169 mg/dL — ABNORMAL HIGH (ref 0–149)
VLDL Cholesterol Cal: 34 mg/dL (ref 5–40)

## 2016-08-24 ENCOUNTER — Encounter: Payer: Self-pay | Admitting: Internal Medicine

## 2016-08-31 ENCOUNTER — Ambulatory Visit
Admission: RE | Admit: 2016-08-31 | Discharge: 2016-08-31 | Disposition: A | Discharge status: Home / Self Care | Payer: Medicaid Other | Source: Ambulatory Visit | Attending: Surgery | Admitting: Surgery

## 2016-08-31 DIAGNOSIS — M5416 Radiculopathy, lumbar region: Secondary | ICD-10-CM

## 2016-08-31 MED ORDER — GADOBENATE DIMEGLUMINE 529 MG/ML IV SOLN
14.0000 mL | Freq: Once | INTRAVENOUS | Status: AC | PRN
  Administered 2016-08-31: 14 mL via INTRAVENOUS

## 2016-09-02 ENCOUNTER — Ambulatory Visit: Payer: Medicaid Other | Attending: Internal Medicine | Admitting: Physical Therapy

## 2016-09-02 ENCOUNTER — Encounter: Payer: Self-pay | Admitting: Physical Therapy

## 2016-09-02 DIAGNOSIS — M5441 Lumbago with sciatica, right side: Secondary | ICD-10-CM | POA: Diagnosis not present

## 2016-09-02 NOTE — Therapy (Signed)
[facility] [address] [place], [place], [address] Phone: [phone]   Fax:  [phone]  Physical Therapy Evaluation  Patient Details  Name: Stacy [name] MRN: [id number] Date of Birth: 20-Dec-1960 Referring Provider: [name]  Encounter Date: 09/02/2016      PT End of Session - 09/02/16 0843    Visit Number 1   Number of Visits 1   Authorization Type Medicaid   PT Start Time 0757   PT Stop Time 0848   PT Time Calculation (min) 51 min   Activity Tolerance Patient tolerated treatment well   Behavior During Therapy [facility] for tasks assessed/performed      Past Medical History:  Diagnosis Date  . Arthritis   . 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 Asthma   . COPD (chronic obstructive pulmonary disease) (HCC)   . Diabetes mellitus    Type II  . GERD (gastroesophageal reflux disease)    Pt on Protonix daily  . Gout   . Headache(784.0)    otc meds prn  . Hyperlipidemia   . Hypertension    Pt on lisinopril  . Irritable bowel syndrome 11/19/2010  . Neuropathy   . Shortness of breath    occasional - uses breathing tx at home    Past Surgical History:  Procedure Laterality Date  . CHOLECYSTECTOMY    . ENDOMETRIAL ABLATION  10/2010  . HERNIA REPAIR     umbicial hernia  . KNEE CLOSED REDUCTION Right 12/06/2015   Procedure: CLOSED MANIPULATION RIGHT KNEE;  Surgeon: Kerrin Champagne, MD;  Location: [facility];  Service: Orthopedics;  Laterality: Right;  . KNEE CLOSED REDUCTION Right 01/17/2016   Procedure: CLOSED MANIPULATION RIGHT KNEE;  Surgeon: Kerrin Champagne, MD;  Location: [facility];  Service: Orthopedics;  Laterality: Right;  . KNEE JOINT MANIPULATION Right 12/06/2015  . LUMBAR DISC SURGERY  06/03/2015   L 2  L3 L4 L5   . LUMBAR LAMINECTOMY/DECOMPRESSION MICRODISCECTOMY N/A 06/03/2015   Procedure: Bilateral lateral recess decompression L2-3, L3-4, L4-5;  Surgeon: Kerrin Champagne, MD;  Location[facility];   Service: Orthopedics;  Laterality: N/A;  . svd      x 2  . TOTAL KNEE ARTHROPLASTY Right 09/06/2015   Procedure: RIGHT TOTAL KNEE ARTHROPLASTY;  Surgeon: Kerrin Champagne, MD;  Location: [facility];  Service: Orthopedics;  Laterality: Right;  . TUBAL LIGATION    . UPPER GASTROINTESTINAL ENDOSCOPY  04/28/11    There were no vitals filed for this visit.       Subjective Assessment - 09/02/16 0803    Subjective Reports that she has been having back pain and leg pain and numbness for about 2 months.  She is unsure of a cause but had a right TKR about a year ago, she had a lot of complications with ROM and may have been walking poorly for a long time that may contribute to this pain.     Limitations Lifting;Standing;Walking   Diagnostic tests had a recent MRI 2 days ago shows DDD, and a disc pritrusion with L5-S1 nerve impingement   Currently in Pain? Yes   Pain Score 7    Pain Location Back   Pain Orientation Lower   Pain Descriptors / Indicators Aching;Numbness;Tingling   Pain Type Acute pain   Pain Radiating Towards numbness and tingling down the right leg mostly, some in the left leg   Pain Onset More than a month ago  Pain Frequency Constant   Aggravating Factors  everything, sitting too long 1 hour the pain will be up to a 10/10   Pain Relieving Factors rest may help some at best pain a 5-6/10   Effect of Pain on Daily Activities difficulty with all things, reports that she cannot walk >3 blocks            [facility] PT Assessment - 09/02/16 0001      Assessment   Medical Diagnosis LBP with radiculopathy   Referring Provider Nitka   Onset Date/Surgical Date 08/25/16   Prior Therapy for the right TKR last year     Precautions   Precautions None     Balance Screen   Has the patient fallen in the past 6 months No   Has the patient had a decrease in activity level because of a fear of falling?  No   Is the patient reluctant to leave their home because of a fear of falling?  No      Home Environment   Additional Comments has some stairs at home     Prior Function   Level of Independence Independent   Vocation On disability   Leisure no exercise     Posture/Postural Control   Posture Comments decreased lordosis     ROM / Strength   AROM / PROM / Strength AROM;Strength     AROM   Overall AROM Comments Lumbar ROM decreased 50% for all motions with pain, AROM of the right knee is 10-90 degrees flexion     Strength   Overall Strength Comments 4-/5 for the LE's with some pain in the low back and in the right leg     Flexibility   Soft Tissue Assessment /Muscle Length --  tight in the HS and piriformis     Palpation   Palpation comment very tight in the low back, tender in the low back and in the right buttock and leg            Objective measurements completed on examination: See above findings.          [facility] Adult PT Treatment/Exercise - 09/02/16 0001      Modalities   Modalities Electrical Stimulation;Moist Heat     Moist Heat Therapy   Number Minutes Moist Heat 15 Minutes   Moist Heat Location Lumbar Spine     Electrical Stimulation   Electrical Stimulation Location Lumbar spine   Electrical Stimulation Action IFC   Electrical Stimulation Parameters supine   Electrical Stimulation Goals Pain                PT Education - 09/02/16 0843    Education provided Yes   Education Details Piriformis stretch, sheet traction for home   Person(s) Educated Patient   Methods Explanation;Demonstration;Verbal cues;Tactile cues;Handout   Comprehension Verbalized understanding;Returned demonstration          PT Short Term Goals - 10/09/15 0920      PT SHORT TERM GOAL #1   Title I with initial HEP   Status Achieved           PT Long Term Goals - 09/02/16 0855      PT LONG TERM GOAL #1   Title I with advanced HEP   Time 1   Period Days   Status Achieved                Plan - 09/02/16 0844    Clinical Impression  Statement Patient is  referred to us for low back pain with right LE radiculopathy.  MRI on Monday showed DDD multiple levels and disc protrusion with L5-S1 nerve impingement.  She reports pain started about 2 months ago and she is unsure of a cause.  She did have a right TKR in August last year and had a lot of difficulty with her ROM, this could be a contributing factor with her having a poor gait pattern.  She has tenderness and tightness int he lumbar mms, the right buttock, and then with tingling in the right leg.  Had some relief with PT trying sheet lumbar traction   Clinical Presentation Evolving   Clinical Decision Making Moderate   Rehab Potential Fair   Clinical Impairments Affecting Rehab Potential Medicaid only allows one visit   PT Frequency One time visit   PT Treatment/Interventions Therapeutic exercise;Electrical Stimulation;Patient/family education   PT Next Visit Plan D/C due to limited visits from medicaid, I gave her information about TENS unit   Consulted and Agree with Plan of Care Patient      Patient will benefit from skilled therapeutic intervention in order to improve the following deficits and impairments:  Decreased activity tolerance, Decreased strength, Decreased range of motion, Difficulty walking, Pain, Improper body mechanics, Postural dysfunction  Visit Diagnosis: Acute bilateral low back pain with right-sided sciatica - Plan: PT plan of care cert/re-cert     Problem List Patient Active Problem List   Diagnosis Date Noted  . Alopecia areata 08/19/2016  . Gastroenteritis 03/11/2016  . postop Arthrofibrosis of knee joint, right 11/29/2015  . Pap smear for cervical cancer screening 10/24/2015  . Healthcare maintenance 10/24/2015  . Osteoarthritis of right knee 09/06/2015  . Type 2 diabetes mellitus without complication, without long-term current use of insulin (HCC) 06/13/2015  . Neurogenic claudication due to lumbar spinal stenosis 06/03/2015    Class:  Chronic  . Chondromalacia of both patellae 06/03/2015    Class: Chronic  . Spinal stenosis, lumbar region, with neurogenic claudication 06/03/2015  . Tobacco use disorder 04/25/2015  . DJD (degenerative joint disease) of knee 01/04/2015  . Falls frequently 11/29/2014  . Hemorrhoid 11/14/2014  . Bilateral knee pain 07/31/2014  . Asthma, chronic 07/19/2014  . Gout of big toe 07/19/2014  . Midline low back pain with right-sided sciatica 01/01/2014  . Bilateral low back pain without sciatica 08/14/2013  . Essential hypertension 08/14/2013  . Gastroesophageal reflux disease without esophagitis 08/14/2013  . COPD exacerbation (HCC) 08/14/2013  . Colon cancer screening 08/14/2013  . Low back pain 05/15/2013  . Preventative health care 05/15/2013  . GERD (gastroesophageal reflux disease) 02/16/2013  . DM (diabetes mellitus) (HCC) 02/16/2013  . COPD (chronic obstructive pulmonary disease) (HCC) 04/17/2011  . Hypertension 04/17/2011  . Cough 04/16/2011    Jearld LeschALBRIGHT,MICHAEL W., PT 09/02/2016, 8:57 AM  [facility] [address] [place], [place], [address] Phone: [phone]   Fax:  [phone]  Name: Stacy [name] MRN: [id number] Date of Birth: 20-May-1960

## 2016-09-03 ENCOUNTER — Other Ambulatory Visit: Payer: Medicaid Other

## 2016-09-08 ENCOUNTER — Ambulatory Visit (INDEPENDENT_AMBULATORY_CARE_PROVIDER_SITE_OTHER): Payer: Medicaid Other | Admitting: Specialist

## 2016-09-08 ENCOUNTER — Encounter (INDEPENDENT_AMBULATORY_CARE_PROVIDER_SITE_OTHER): Payer: Self-pay | Admitting: Specialist

## 2016-09-08 VITALS — BP 132/81 | HR 71 | Temp 97.6°F | Ht 66.0 in | Wt 148.0 lb

## 2016-09-08 DIAGNOSIS — M5417 Radiculopathy, lumbosacral region: Secondary | ICD-10-CM

## 2016-09-08 DIAGNOSIS — M5416 Radiculopathy, lumbar region: Secondary | ICD-10-CM

## 2016-09-08 DIAGNOSIS — M48062 Spinal stenosis, lumbar region with neurogenic claudication: Secondary | ICD-10-CM | POA: Diagnosis not present

## 2016-09-08 MED ORDER — HYDROCODONE-ACETAMINOPHEN 5-325 MG PO TABS: 1.0000 | ORAL_TABLET | Freq: Four times a day (QID) | ORAL | 0 refills | Status: DC | PRN

## 2016-09-08 NOTE — Progress Notes (Signed)
Office Visit Note   Patient: Stacy [name]           Date of Birth: 11/09/1960           MRN: [id number] Visit Date: 09/08/2016              Requested by: Quentin Angst, MD [address] [place], [place] [address] PCP: Quentin Angst, MD   Assessment & Plan: Visit Diagnoses:  1. Lumbar back pain with radiculopathy affecting right lower extremity   2. Spinal stenosis of lumbar region with neurogenic claudication     Plan: Avoid bending, stooping and avoid lifting weights greater than 10 lbs. Avoid prolong standing and walking. Order for a new walker with wheels. Surgery scheduling secretary Tivis Ringer, will call you in the next week to schedule for surgery.  Surgery recommended is a lumbar partial hemilaminectomy right L5-S1.Take hydrocodone for for pain. Risk of surgery includes risk of infection 1 in 100 patients, bleeding 1/2% chance you would need a transfusion.   Risk to the nerves is one in 10,000.  Expect improved walking and standing tolerance. Expect relief of leg pain but numbness may persist depending on the length and degree of pressure that has been present.  Follow-Up Instructions: Return in about 4 weeks (around 10/06/2016) for post op follow up.   Orders:  No orders of the defined types were placed in this encounter.  Meds ordered this encounter  Medications  . HYDROcodone-acetaminophen (NORCO/VICODIN) 5-325 MG tablet    Sig: Take 1 tablet by mouth every 6 (six) hours as needed for moderate pain.    Dispense:  40 tablet    Refill:  0      Procedures: No procedures performed   Clinical Data: Findings:  MRI lumbar spine with and without contrast shows narrowing of the right L5-S1 lateral recess with possible small HNP impressing on the right S1 nerve root. Previous bilateral hemilaminectomies L2-3, L3-4 and L4-5 with good decompression.     Subjective: Chief Complaint  Patient presents with  . Lower Back - Pain    56 year old  female with history of lumbar laminectomy for mild lumbar spinal stenosis. She had an MRI  Done last Monday, 08/31/2016. Pain is nearly all the time with pain all night long into the right leg. She has pain in the posterior right thigh and right calf . She is taking methocarbomal intermittantly. There is numbness in the right posterior calf. Tries to keep pressure off her back, it goes into the toes all of them.The right leg feels weak. She is status post right Total Knee Replacement, 09/06/2015, then had manipulation of the right knee replacement due to loss of motion. The manipulations in 12/2015 and 01/2016. The right leg numbness and tingling is worse with bending, stooping, riding in cars and also present with standing and walking. On a scale of 1-10 the pain is about an 8 or 9.     Review of Systems  Constitutional: Positive for activity change. Negative for appetite change, chills, diaphoresis, fatigue, fever and unexpected weight change.  HENT: Positive for rhinorrhea, sinus pain and sinus pressure. Negative for congestion, dental problem, drooling, ear discharge, ear pain, facial swelling, hearing loss, mouth sores, nosebleeds, postnasal drip, sneezing, sore throat, tinnitus, trouble swallowing and voice change.   Eyes: Negative.   Respiratory: Positive for shortness of breath and wheezing. Negative for apnea, cough, choking, chest tightness and stridor.   Cardiovascular: Positive for leg swelling. Negative for chest  pain and palpitations.  Gastrointestinal: Positive for abdominal distention, abdominal pain and rectal pain. Negative for anal bleeding, blood in stool, constipation, diarrhea, nausea and vomiting.  Endocrine: Negative for cold intolerance and heat intolerance.  Genitourinary: Negative for difficulty urinating, dysuria, enuresis, flank pain, frequency and hematuria.  Musculoskeletal: Positive for back pain and joint swelling. Negative for arthralgias, gait problem, myalgias, neck  pain and neck stiffness.  Skin: Negative.  Negative for color change, pallor, rash and wound.  Allergic/Immunologic: Negative.   Neurological: Positive for seizures, weakness, light-headedness and headaches. Negative for dizziness, tremors, syncope, facial asymmetry, speech difficulty and numbness.  Hematological: Positive for adenopathy. Does not bruise/bleed easily.  Psychiatric/Behavioral: Negative for agitation, behavioral problems, confusion, decreased concentration, dysphoric mood, hallucinations, self-injury, sleep disturbance and suicidal ideas. The patient is not nervous/anxious and is not hyperactive.      Objective: Vital Signs: BP 132/81 (BP Location: Right Arm, Patient Position: Sitting, Cuff Size: Normal)   Pulse 71   Temp 97.6 F (36.4 C) (Oral)   Ht 5\' 6"  (1.676 m)   Wt 148 lb (67.1 kg)   BMI 23.89 kg/m   Physical Exam  Constitutional: She appears well-developed and well-nourished.  HENT:  Head: Normocephalic and atraumatic.  Nose: Nose normal.  Eyes: Conjunctivae are normal. Right eye exhibits no discharge. Left eye exhibits no discharge.  Neck: Neck supple. No tracheal deviation present. No thyromegaly present.  Pulmonary/Chest: No respiratory distress. She has no wheezes. She has no rales.  Musculoskeletal: She exhibits no edema.  Psychiatric: She has a normal mood and affect. Her behavior is normal. Judgment and thought content normal.    Back Exam   Tenderness  The patient is experiencing tenderness in the lumbar.  Range of Motion  Extension: abnormal  Flexion: abnormal  Lateral Bend Right: abnormal  Lateral Bend Left: abnormal  Rotation Right: abnormal  Rotation Left: abnormal   Muscle Strength  Right Quadriceps:  5/5  Left Quadriceps:  5/5  Right Hamstrings:  5/5   Tests  Straight leg raise right: positive Straight leg raise left: negative  Reflexes  Patellar: normal Achilles:  0/4 abnormal Babinski's sign: normal   Other  Toe Walk:  abnormal Heel Walk: abnormal Sensation: decreased Gait: antalgic  Erythema: no back redness Scars: present  Comments:  Right PCT positive, Right SLR at 45 degrees.       Specialty Comments:  No specialty comments available.  Imaging: No results found.   PMFS History: Patient Active Problem List   Diagnosis Date Noted  . Neurogenic claudication due to lumbar spinal stenosis 06/03/2015    Priority: High    Class: Chronic  . Chondromalacia of both patellae 06/03/2015    Priority: High    Class: Chronic  . Alopecia areata 08/19/2016  . Gastroenteritis 03/11/2016  . postop Arthrofibrosis of knee joint, right 11/29/2015  . Pap smear for cervical cancer screening 10/24/2015  . Healthcare maintenance 10/24/2015  . Osteoarthritis of right knee 09/06/2015  . Type 2 diabetes mellitus without complication, without long-term current use of insulin (HCC) 06/13/2015  . Spinal stenosis, lumbar region, with neurogenic claudication 06/03/2015  . Tobacco use disorder 04/25/2015  . DJD (degenerative joint disease) of knee 01/04/2015  . Falls frequently 11/29/2014  . Hemorrhoid 11/14/2014  . Bilateral knee pain 07/31/2014  . Asthma, chronic 07/19/2014  . Gout of big toe 07/19/2014  . Midline low back pain with right-sided sciatica 01/01/2014  . Bilateral low back pain without sciatica 08/14/2013  . Essential hypertension 08/14/2013  .  Gastroesophageal reflux disease without esophagitis 08/14/2013  . COPD exacerbation (HCC) 08/14/2013  . Colon cancer screening 08/14/2013  . Low back pain 05/15/2013  . Preventative health care 05/15/2013  . GERD (gastroesophageal reflux disease) 02/16/2013  . DM (diabetes mellitus) (HCC) 02/16/2013  . COPD (chronic obstructive pulmonary disease) (HCC) 04/17/2011  . Hypertension 04/17/2011  . Cough 04/16/2011   Past Medical History:  Diagnosis Date  . Arthritis   . 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 Asthma   . COPD  (chronic obstructive pulmonary disease) (HCC)   . Diabetes mellitus    Type II  . GERD (gastroesophageal reflux disease)    Pt on Protonix daily  . Gout   . Headache(784.0)    otc meds prn  . Hyperlipidemia   . Hypertension    Pt on lisinopril  . Irritable bowel syndrome 11/19/2010  . Neuropathy   . Shortness of breath    occasional - uses breathing tx at home    Family History  Problem Relation Age of Onset  . Hypertension Father   . Cancer Father   . Asthma Son        had as a child  . Heart disease Sister   . Breast cancer Sister   . Hypertension Brother     Past Surgical History:  Procedure Laterality Date  . CHOLECYSTECTOMY    . ENDOMETRIAL ABLATION  10/2010  . HERNIA REPAIR     umbicial hernia  . KNEE CLOSED REDUCTION Right 12/06/2015   Procedure: CLOSED MANIPULATION RIGHT KNEE;  Surgeon: Kerrin Champagne, MD;  Location: [facility] OR;  Service: Orthopedics;  Laterality: Right;  . KNEE CLOSED REDUCTION Right 01/17/2016   Procedure: CLOSED MANIPULATION RIGHT KNEE;  Surgeon: Kerrin Champagne, MD;  Location: [facility] OR;  Service: Orthopedics;  Laterality: Right;  . KNEE JOINT MANIPULATION Right 12/06/2015  . LUMBAR DISC SURGERY  06/03/2015   L 2  L3 L4 L5   . LUMBAR LAMINECTOMY/DECOMPRESSION MICRODISCECTOMY N/A 06/03/2015   Procedure: Bilateral lateral recess decompression L2-3, L3-4, L4-5;  Surgeon: Kerrin Champagne, MD;  Location: [facility] OR;  Service: Orthopedics;  Laterality: N/A;  . svd      x 2  . TOTAL KNEE ARTHROPLASTY Right 09/06/2015   Procedure: RIGHT TOTAL KNEE ARTHROPLASTY;  Surgeon: Kerrin Champagne, MD;  Location: [facility] OR;  Service: Orthopedics;  Laterality: Right;  . TUBAL LIGATION    . UPPER GASTROINTESTINAL ENDOSCOPY  04/28/11   Social History   Occupational History  . unemployed Unemployed   Social History Main Topics  . Smoking status: Current Every Day Smoker    Packs/day: 0.25    Years: 32.00    Types: Cigarettes  . Smokeless tobacco: Never Used  . Alcohol use No  . Drug  use: No  . Sexual activity: Yes    Birth control/ protection: Surgical

## 2016-09-08 NOTE — Patient Instructions (Signed)
Avoid bending, stooping and avoid lifting weights greater than 10 lbs. Avoid prolong standing and walking. Order for a new walker with wheels. Surgery scheduling secretary Tivis RingerSherri Billings, will call you in the next week to schedule for surgery.  Surgery recommended is a lumbar partial hemilaminectomy right L5-S1.Take hydrocodone for for pain. Risk of surgery includes risk of infection 1 in 100 patients, bleeding 1/2% chance you would need a transfusion.   Risk to the nerves is one in 10,000.  Expect improved walking and standing tolerance. Expect relief of leg pain but numbness may persist depending on the length and degree of pressure that has been present.

## 2016-09-09 ENCOUNTER — Ambulatory Visit: Payer: Medicaid Other | Admitting: Internal Medicine

## 2016-09-11 ENCOUNTER — Telehealth (INDEPENDENT_AMBULATORY_CARE_PROVIDER_SITE_OTHER): Payer: Self-pay

## 2016-09-11 NOTE — Telephone Encounter (Signed)
Patient called concerning PA for Hydrocodone Rx.  Cb# is [phone].  Please advise.  Thank You.

## 2016-09-11 NOTE — Telephone Encounter (Signed)
I called and advised patient that [facility] and that I was waiting for them to approve meds.  I did advise her to check with her pharm and have to try to run the rx and if it goes thru then they have approved as I will not call till Monday or Tuesday. Patient states that she understands and will call her pharm.

## 2016-09-17 ENCOUNTER — Telehealth: Payer: Self-pay | Admitting: *Deleted

## 2016-09-17 NOTE — Telephone Encounter (Signed)
-----   Message from Quentin Angstlugbemiga E Jegede, MD sent at 09/01/2016  4:23 PM EDT ----- Lab results are normal except slightly low vitamin D level. Advise patient to take over the counter vitamin d daily supplement. Cholesterol level is slightly high, please take your medications and also please limit saturated fat to no more than 7% of your calories, limit cholesterol to 200 mg/day, increase fiber and exercise as tolerated. If needed we may add another cholesterol lowering medication to your regimen.

## 2016-09-17 NOTE — Telephone Encounter (Signed)
Medical Assistant left message on patient's home and cell voicemail. Voicemail states to give a call back to [place] [facility] at [phone].  !!!Please inform patient of vitamin d level being low and an OTC supplement will suffice. Cholesterol is slightly high so continue taking current medication. A recheck will be completed at the next visit!!!!

## 2016-09-21 ENCOUNTER — Telehealth: Payer: Self-pay

## 2016-09-21 NOTE — Telephone Encounter (Signed)
Call received from [place], [facility] who stated that the patient informed her that [facility] sent paperwork to the [facility] and are waiting for the documents to be returned. Alessandra Bevels was not sure what documents were sent.  Have you received anything?

## 2016-09-21 NOTE — Telephone Encounter (Signed)
Patients medical clearance was provided to PCP and is awaiting signature and to be faxed back

## 2016-09-23 ENCOUNTER — Telehealth: Payer: Self-pay

## 2016-09-23 NOTE — Telephone Encounter (Signed)
Attempted to contact Vonn Scovens. [facility] to inform her that the medical clearance paperwork is awaiting signature. Call placed to # [phone] and a HIPAA compliant voicemail message was left requesting a call back to # [phone]

## 2016-09-23 NOTE — Telephone Encounter (Signed)
Message received from [employer], [facility] returning CM call.  Call returned to D. Scovens and informed her that Johney Frame, RN/ [facility] was in the office this morning and this CM  Updated her on the status of documentation requested. No need for call back at this time

## 2016-09-30 ENCOUNTER — Other Ambulatory Visit: Payer: Self-pay | Admitting: Internal Medicine

## 2016-09-30 ENCOUNTER — Other Ambulatory Visit (HOSPITAL_COMMUNITY): Payer: Self-pay | Admitting: *Deleted

## 2016-09-30 DIAGNOSIS — F17200 Nicotine dependence, unspecified, uncomplicated: Secondary | ICD-10-CM

## 2016-09-30 NOTE — Pre-Procedure Instructions (Addendum)
Stacy [name]  09/30/2016    Your procedure is scheduled on Friday, October 02, 2016 at 12:30 PM.   Report to [facility] Entrance "A" Admitting Office at 10:30 AM.   Call this number if you have problems the morning of surgery: [phone]   Remember:  Do not eat food or drink liquids after midnight tonight.  Take these medicines the morning of surgery with A SIP OF WATER: Gabapentin (Neurontin), Pantoprazole (Protonix), Symbicort inhaler, Hydrocodone - if needed, Albuterol nebulizer - if needed  Do NOT smoke 24 hours prior to surgery.   How to Manage Your Diabetes Before Surgery   Why is it important to control my blood sugar before and after surgery?   Improving blood sugar levels before and after surgery helps healing and can limit problems.  A way of improving blood sugar control is eating a healthy diet by:  - Eating less sugar and carbohydrates  - Increasing activity/exercise  - Talk with your doctor about reaching your blood sugar goals  High blood sugars (greater than 180 mg/dL) can raise your risk of infections and slow down your recovery so you will need to focus on controlling your diabetes during the weeks before surgery.  Make sure that the doctor who takes care of your diabetes knows about your planned surgery including the date and location.  How do I manage my blood sugars before surgery?   Check your blood sugar at least 4 times a day, 2 days before surgery to make sure that they are not too high or low.  Check your blood sugar the morning of your surgery when you wake up and every 2 hours until you get to the Short-Stay unit.  Treat a low blood sugar (less than 70 mg/dL) with 1/2 cup of clear juice (cranberry or apple), 4 glucose tablets, OR glucose gel.  Recheck blood sugar in 15 minutes after treatment (to make sure it is greater than 70 mg/dL).  If blood sugar is not greater than 70 mg/dL on re-check, call [phone] for further  instructions.   Report your blood sugar to the Short-Stay nurse when you get to Short-Stay.  References:  [facility], 2007 "How to Manage your Diabetes Before and After Surgery".  What do I do about my diabetes medications?   Do not take oral diabetes medicines (pills) the morning of surgery.   Do not wear jewelry, make-up or nail polish.  Do not wear lotions, powders, perfumes or deodorant.  Do not shave 48 hours prior to surgery.   Do not bring valuables to the hospital.  [facility] is not responsible for any belongings or valuables.  Contacts, dentures or bridgework may not be worn into surgery.  Leave your suitcase in the car.  After surgery it may be brought to your room.  For patients admitted to the hospital, discharge time will be determined by your treatment team.  Patients discharged the day of surgery will not be allowed to drive home.   [facility] - Preparing for Surgery  Before surgery, you can play an important role.  Because skin is not sterile, your skin needs to be as free of germs as possible.  You can reduce the number of germs on you skin by washing with CHG (chlorahexidine gluconate) soap before surgery.  CHG is an antiseptic cleaner which kills germs and bonds with the skin to continue killing germs even after washing.  Please DO NOT use if you have  an allergy to CHG or antibacterial soaps.  If your skin becomes reddened/irritated stop using the CHG and inform your nurse when you arrive at Short Stay.  Do not shave (including legs and underarms) for at least 48 hours prior to the first CHG shower.  You may shave your face.  Please follow these instructions carefully:   1.  Shower with CHG Soap the night before surgery and the                    morning of Surgery.  2.  If you choose to wash your hair, wash your hair first as usual with your       normal shampoo.  3.  After you shampoo, rinse your hair and body thoroughly to  remove the shampoo.  4.  Use CHG as you would any other liquid soap.  You can apply chg directly       to the skin and wash gently with scrungie or a clean washcloth.  5.  Apply the CHG Soap to your body ONLY FROM THE NECK DOWN.        Do not use on open wounds or open sores.  Avoid contact with your eyes, ears, mouth and genitals (private parts).  Wash genitals (private parts) with your normal soap.  6.  Wash thoroughly, paying special attention to the area where your surgery        will be performed.  7.  Thoroughly rinse your body with warm water from the neck down.  8.  DO NOT shower/wash with your normal soap after using and rinsing off       the CHG Soap.  9.  Pat yourself dry with a clean towel.            10.  Wear clean pajamas.            11.  Place clean sheets on your bed the night of your first shower and do not        sleep with pets.  Day of Surgery  Do not apply any lotions/deodorants the morning of surgery.  Please wear clean clothes to the hospital.   Please read over the fact sheets that you were given.

## 2016-10-01 ENCOUNTER — Telehealth (INDEPENDENT_AMBULATORY_CARE_PROVIDER_SITE_OTHER): Payer: Self-pay | Admitting: Specialist

## 2016-10-01 ENCOUNTER — Other Ambulatory Visit: Payer: Self-pay | Admitting: Internal Medicine

## 2016-10-01 ENCOUNTER — Encounter (HOSPITAL_COMMUNITY): Payer: Self-pay

## 2016-10-01 ENCOUNTER — Encounter (HOSPITAL_COMMUNITY)
Admission: RE | Admit: 2016-10-01 | Discharge: 2016-10-01 | Disposition: A | Discharge status: Home / Self Care | Payer: Medicaid Other | Source: Ambulatory Visit | Attending: Specialist | Admitting: Specialist

## 2016-10-01 ENCOUNTER — Other Ambulatory Visit (INDEPENDENT_AMBULATORY_CARE_PROVIDER_SITE_OTHER): Payer: Self-pay | Admitting: Specialist

## 2016-10-01 DIAGNOSIS — Z01812 Encounter for preprocedural laboratory examination: Secondary | ICD-10-CM | POA: Insufficient documentation

## 2016-10-01 DIAGNOSIS — Z01810 Encounter for preprocedural cardiovascular examination: Secondary | ICD-10-CM | POA: Diagnosis not present

## 2016-10-01 DIAGNOSIS — M48062 Spinal stenosis, lumbar region with neurogenic claudication: Secondary | ICD-10-CM | POA: Diagnosis not present

## 2016-10-01 DIAGNOSIS — I10 Essential (primary) hypertension: Secondary | ICD-10-CM | POA: Diagnosis not present

## 2016-10-01 DIAGNOSIS — M5416 Radiculopathy, lumbar region: Secondary | ICD-10-CM | POA: Diagnosis not present

## 2016-10-01 DIAGNOSIS — E119 Type 2 diabetes mellitus without complications: Secondary | ICD-10-CM | POA: Diagnosis not present

## 2016-10-01 DIAGNOSIS — J449 Chronic obstructive pulmonary disease, unspecified: Secondary | ICD-10-CM | POA: Diagnosis not present

## 2016-10-01 DIAGNOSIS — F17210 Nicotine dependence, cigarettes, uncomplicated: Secondary | ICD-10-CM | POA: Diagnosis not present

## 2016-10-01 HISTORY — DX: Restless legs syndrome: G25.81

## 2016-10-01 HISTORY — DX: Unspecified glaucoma: H40.9

## 2016-10-01 HISTORY — DX: Pneumonia, unspecified organism: J18.9

## 2016-10-01 LAB — CBC
HCT: 34.2 % — ABNORMAL LOW (ref 36.0–46.0)
Hemoglobin: 11.4 g/dL — ABNORMAL LOW (ref 12.0–15.0)
MCH: 26.8 pg (ref 26.0–34.0)
MCHC: 33.3 g/dL (ref 30.0–36.0)
MCV: 80.5 fL (ref 78.0–100.0)
Platelets: 229 10*3/uL (ref 150–400)
RBC: 4.25 MIL/uL (ref 3.87–5.11)
RDW: 15.4 % (ref 11.5–15.5)
WBC: 5.6 10*3/uL (ref 4.0–10.5)

## 2016-10-01 LAB — BASIC METABOLIC PANEL
Anion gap: 9 (ref 5–15)
BUN: 11 mg/dL (ref 6–20)
CO2: 29 mmol/L (ref 22–32)
Calcium: 9.3 mg/dL (ref 8.9–10.3)
Chloride: 100 mmol/L — ABNORMAL LOW (ref 101–111)
Creatinine, Ser: 0.77 mg/dL (ref 0.44–1.00)
GFR calc Af Amer: >60 mL/min (ref >60)
GFR calc non Af Amer: >60 mL/min (ref >60)
Glucose, Bld: 89 mg/dL (ref 65–99)
Potassium: 3.3 mmol/L — ABNORMAL LOW (ref 3.5–5.1)
Sodium: 138 mmol/L (ref 135–145)

## 2016-10-01 LAB — HEMOGLOBIN A1C
Hgb A1c MFr Bld: 6.4 % — ABNORMAL HIGH (ref 4.8–5.6)
Mean Plasma Glucose: 136.98 mg/dL

## 2016-10-01 LAB — GLUCOSE, CAPILLARY: Glucose-Capillary: 88 mg/dL (ref 65–99)

## 2016-10-01 LAB — SURGICAL PCR SCREEN
MRSA, PCR: NEGATIVE
Staphylococcus aureus: NEGATIVE

## 2016-10-01 NOTE — Telephone Encounter (Signed)
Patient called advised she need a letter for housing  Written by Dr Otelia SergeantNitka so that she can get a lower level apartment on the first floor.  The number to contact patient is [phone]

## 2016-10-01 NOTE — Progress Notes (Signed)
Pt denies cardiac history or chest pain. Has sob with exertion due to COPD and Asthma. Pt is a type 2 Diabetic. Last A1C was 6.4 in May, 2018. Today's CBG was 88 and she states that is usually what her fasting blood sugar is at home.

## 2016-10-01 NOTE — Telephone Encounter (Signed)
Patient called advised she need a letter for housing  Written by Dr Nitka so that she can get a lower level apartment on the first floor.  The number to contact patient is [phone] 

## 2016-10-02 ENCOUNTER — Observation Stay (HOSPITAL_COMMUNITY)
Admission: RE | Admit: 2016-10-02 | Discharge: 2016-10-03 | Disposition: A | Discharge status: Home / Self Care | Payer: Medicaid Other | Source: Ambulatory Visit | Attending: Specialist | Admitting: Specialist

## 2016-10-02 ENCOUNTER — Ambulatory Visit (HOSPITAL_COMMUNITY): Payer: Medicaid Other

## 2016-10-02 ENCOUNTER — Other Ambulatory Visit (INDEPENDENT_AMBULATORY_CARE_PROVIDER_SITE_OTHER): Payer: Self-pay | Admitting: Specialist

## 2016-10-02 ENCOUNTER — Ambulatory Visit (HOSPITAL_COMMUNITY): Payer: Medicaid Other | Admitting: Certified Registered"

## 2016-10-02 ENCOUNTER — Encounter (HOSPITAL_COMMUNITY): Payer: Self-pay | Admitting: General Practice

## 2016-10-02 ENCOUNTER — Ambulatory Visit (HOSPITAL_COMMUNITY)
Admission: RE | Disposition: A | Discharge status: Home / Self Care | Payer: Self-pay | Source: Ambulatory Visit | Attending: Specialist

## 2016-10-02 DIAGNOSIS — E785 Hyperlipidemia, unspecified: Secondary | ICD-10-CM | POA: Diagnosis not present

## 2016-10-02 DIAGNOSIS — J449 Chronic obstructive pulmonary disease, unspecified: Secondary | ICD-10-CM | POA: Insufficient documentation

## 2016-10-02 DIAGNOSIS — Z419 Encounter for procedure for purposes other than remedying health state, unspecified: Secondary | ICD-10-CM

## 2016-10-02 DIAGNOSIS — Z7951 Long term (current) use of inhaled steroids: Secondary | ICD-10-CM | POA: Diagnosis not present

## 2016-10-02 DIAGNOSIS — Z7984 Long term (current) use of oral hypoglycemic drugs: Secondary | ICD-10-CM | POA: Diagnosis not present

## 2016-10-02 DIAGNOSIS — Z79899 Other long term (current) drug therapy: Secondary | ICD-10-CM | POA: Insufficient documentation

## 2016-10-02 DIAGNOSIS — M4807 Spinal stenosis, lumbosacral region: Secondary | ICD-10-CM | POA: Diagnosis not present

## 2016-10-02 DIAGNOSIS — Z96651 Presence of right artificial knee joint: Secondary | ICD-10-CM | POA: Diagnosis not present

## 2016-10-02 DIAGNOSIS — M48062 Spinal stenosis, lumbar region with neurogenic claudication: Secondary | ICD-10-CM

## 2016-10-02 DIAGNOSIS — M5116 Intervertebral disc disorders with radiculopathy, lumbar region: Principal | ICD-10-CM | POA: Insufficient documentation

## 2016-10-02 DIAGNOSIS — K219 Gastro-esophageal reflux disease without esophagitis: Secondary | ICD-10-CM | POA: Insufficient documentation

## 2016-10-02 DIAGNOSIS — M109 Gout, unspecified: Secondary | ICD-10-CM | POA: Diagnosis not present

## 2016-10-02 DIAGNOSIS — M545 Low back pain: Secondary | ICD-10-CM | POA: Diagnosis present

## 2016-10-02 DIAGNOSIS — F17210 Nicotine dependence, cigarettes, uncomplicated: Secondary | ICD-10-CM | POA: Insufficient documentation

## 2016-10-02 DIAGNOSIS — Z9889 Other specified postprocedural states: Secondary | ICD-10-CM

## 2016-10-02 DIAGNOSIS — I10 Essential (primary) hypertension: Secondary | ICD-10-CM | POA: Diagnosis not present

## 2016-10-02 DIAGNOSIS — E1140 Type 2 diabetes mellitus with diabetic neuropathy, unspecified: Secondary | ICD-10-CM | POA: Diagnosis not present

## 2016-10-02 HISTORY — PX: LUMBAR LAMINECTOMY/DECOMPRESSION MICRODISCECTOMY: SHX5026

## 2016-10-02 LAB — GLUCOSE, CAPILLARY
Glucose-Capillary: 99 mg/dL (ref 65–99)
Glucose-Capillary: 98 mg/dL (ref 65–99)
Glucose-Capillary: 129 mg/dL — ABNORMAL HIGH (ref 65–99)
Glucose-Capillary: 128 mg/dL — ABNORMAL HIGH (ref 65–99)
Glucose-Capillary: 161 mg/dL — ABNORMAL HIGH (ref 65–99)

## 2016-10-02 LAB — COMPREHENSIVE METABOLIC PANEL
ALT: 18 U/L (ref 14–54)
AST: 23 U/L (ref 15–41)
Albumin: 3.7 g/dL (ref 3.5–5.0)
Alkaline Phosphatase: 80 U/L (ref 38–126)
Anion gap: 9 (ref 5–15)
BUN: 7 mg/dL (ref 6–20)
CO2: 29 mmol/L (ref 22–32)
Calcium: 9.1 mg/dL (ref 8.9–10.3)
Chloride: 102 mmol/L (ref 101–111)
Creatinine, Ser: 0.73 mg/dL (ref 0.44–1.00)
GFR calc Af Amer: >60 mL/min (ref >60)
GFR calc non Af Amer: >60 mL/min (ref >60)
Glucose, Bld: 103 mg/dL — ABNORMAL HIGH (ref 65–99)
Potassium: 3.4 mmol/L — ABNORMAL LOW (ref 3.5–5.1)
Sodium: 140 mmol/L (ref 135–145)
Total Bilirubin: 0.5 mg/dL (ref 0.3–1.2)
Total Protein: 6.6 g/dL (ref 6.5–8.1)

## 2016-10-02 LAB — PROTIME-INR
INR: 1
Prothrombin Time: 13.1 seconds (ref 11.4–15.2)

## 2016-10-02 LAB — APTT: aPTT: 30 seconds (ref 24–36)

## 2016-10-02 SURGERY — LUMBAR LAMINECTOMY/DECOMPRESSION MICRODISCECTOMY
Anesthesia: General | Site: Back

## 2016-10-02 MED ORDER — HYDROXYZINE HCL 10 MG PO TABS
10.0000 mg | ORAL_TABLET | Freq: Three times a day (TID) | ORAL | Status: DC | PRN
  Filled 2016-10-02: qty 1

## 2016-10-02 MED ORDER — ONDANSETRON HCL 4 MG/2ML IJ SOLN
INTRAMUSCULAR | Status: DC | PRN
  Administered 2016-10-02: 4 mg via INTRAVENOUS

## 2016-10-02 MED ORDER — MORPHINE SULFATE (PF) 4 MG/ML IV SOLN: 1.0000 mg | INTRAVENOUS | Status: DC | PRN

## 2016-10-02 MED ORDER — BISACODYL 5 MG PO TBEC: 5.0000 mg | DELAYED_RELEASE_TABLET | Freq: Every day | ORAL | Status: DC | PRN

## 2016-10-02 MED ORDER — HYDROCODONE-ACETAMINOPHEN 7.5-325 MG PO TABS
1.0000 | ORAL_TABLET | ORAL | Status: DC | PRN
  Administered 2016-10-02 – 2016-10-03 (×3): 2 via ORAL
  Filled 2016-10-02 (×3): qty 2

## 2016-10-02 MED ORDER — HYDROCORTISONE ACE-PRAMOXINE 2.5-1 % RE CREA
1.0000 "application " | TOPICAL_CREAM | Freq: Two times a day (BID) | RECTAL | Status: DC | PRN
  Filled 2016-10-02: qty 30

## 2016-10-02 MED ORDER — SODIUM CHLORIDE 0.9% FLUSH: 3.0000 mL | INTRAVENOUS | Status: DC | PRN

## 2016-10-02 MED ORDER — FLEET ENEMA 7-19 GM/118ML RE ENEM
1.0000 | ENEMA | Freq: Once | RECTAL | Status: DC | PRN
Start: 2016-10-02 — End: 2016-10-03

## 2016-10-02 MED ORDER — ALUM & MAG HYDROXIDE-SIMETH 200-200-20 MG/5ML PO SUSP: 30.0000 mL | Freq: Four times a day (QID) | ORAL | Status: DC | PRN

## 2016-10-02 MED ORDER — MIDAZOLAM HCL 5 MG/5ML IJ SOLN
INTRAMUSCULAR | Status: DC | PRN
  Administered 2016-10-02: 2 mg via INTRAVENOUS

## 2016-10-02 MED ORDER — METHOCARBAMOL 1000 MG/10ML IJ SOLN
500.0000 mg | Freq: Four times a day (QID) | INTRAVENOUS | Status: DC | PRN
  Filled 2016-10-02: qty 5

## 2016-10-02 MED ORDER — POTASSIUM CHLORIDE CRYS ER 20 MEQ PO TBCR
20.0000 meq | EXTENDED_RELEASE_TABLET | Freq: Every day | ORAL | Status: DC
  Administered 2016-10-02 – 2016-10-03 (×2): 20 meq via ORAL
  Filled 2016-10-02 (×2): qty 1

## 2016-10-02 MED ORDER — ONDANSETRON HCL 4 MG PO TABS: 4.0000 mg | ORAL_TABLET | Freq: Four times a day (QID) | ORAL | Status: DC | PRN

## 2016-10-02 MED ORDER — EPHEDRINE SULFATE-NACL 50-0.9 MG/10ML-% IV SOSY
PREFILLED_SYRINGE | INTRAVENOUS | Status: DC | PRN
  Administered 2016-10-02 (×4): 10 mg via INTRAVENOUS

## 2016-10-02 MED ORDER — SUGAMMADEX SODIUM 200 MG/2ML IV SOLN
INTRAVENOUS | Status: DC | PRN
  Administered 2016-10-02: 150 mg via INTRAVENOUS

## 2016-10-02 MED ORDER — ONDANSETRON HCL 4 MG/2ML IJ SOLN: 4.0000 mg | Freq: Four times a day (QID) | INTRAMUSCULAR | Status: DC | PRN

## 2016-10-02 MED ORDER — BUPIVACAINE HCL 0.5 % IJ SOLN
INTRAMUSCULAR | Status: DC | PRN
  Administered 2016-10-02: 10 mL

## 2016-10-02 MED ORDER — SODIUM CHLORIDE 0.9 % IV SOLN
INTRAVENOUS | Status: DC
  Administered 2016-10-02: 18:00:00 via INTRAVENOUS

## 2016-10-02 MED ORDER — DOCUSATE SODIUM 100 MG PO CAPS
100.0000 mg | ORAL_CAPSULE | Freq: Two times a day (BID) | ORAL | Status: DC
  Administered 2016-10-02 – 2016-10-03 (×2): 100 mg via ORAL
  Filled 2016-10-02 (×2): qty 1

## 2016-10-02 MED ORDER — CEFAZOLIN SODIUM-DEXTROSE 2-4 GM/100ML-% IV SOLN
2.0000 g | Freq: Three times a day (TID) | INTRAVENOUS | Status: AC
  Administered 2016-10-02 – 2016-10-03 (×2): 2 g via INTRAVENOUS
  Filled 2016-10-02 (×2): qty 100

## 2016-10-02 MED ORDER — IRBESARTAN 150 MG PO TABS
150.0000 mg | ORAL_TABLET | Freq: Every day | ORAL | Status: DC
  Administered 2016-10-02 – 2016-10-03 (×2): 150 mg via ORAL
  Filled 2016-10-02 (×2): qty 1

## 2016-10-02 MED ORDER — ADULT MULTIVITAMIN W/MINERALS CH
1.0000 | ORAL_TABLET | Freq: Every day | ORAL | Status: DC
  Administered 2016-10-02 – 2016-10-03 (×2): 1 via ORAL
  Filled 2016-10-02 (×2): qty 1

## 2016-10-02 MED ORDER — ALBUTEROL SULFATE (2.5 MG/3ML) 0.083% IN NEBU: 2.5000 mg | INHALATION_SOLUTION | RESPIRATORY_TRACT | Status: DC | PRN

## 2016-10-02 MED ORDER — MIDAZOLAM HCL 2 MG/2ML IJ SOLN
INTRAMUSCULAR | Status: AC
  Filled 2016-10-02: qty 2

## 2016-10-02 MED ORDER — ROCURONIUM BROMIDE 10 MG/ML (PF) SYRINGE
PREFILLED_SYRINGE | INTRAVENOUS | Status: AC
  Filled 2016-10-02: qty 5

## 2016-10-02 MED ORDER — THROMBIN 20000 UNITS EX SOLR
CUTANEOUS | Status: AC
  Filled 2016-10-02: qty 20000

## 2016-10-02 MED ORDER — MOMETASONE FURO-FORMOTEROL FUM 100-5 MCG/ACT IN AERO: 2.0000 | INHALATION_SPRAY | Freq: Two times a day (BID) | RESPIRATORY_TRACT | Status: DC

## 2016-10-02 MED ORDER — ACETAMINOPHEN 325 MG PO TABS
650.0000 mg | ORAL_TABLET | ORAL | Status: DC | PRN
  Administered 2016-10-03: 650 mg via ORAL
  Filled 2016-10-02: qty 2

## 2016-10-02 MED ORDER — FENTANYL CITRATE (PF) 250 MCG/5ML IJ SOLN
INTRAMUSCULAR | Status: AC
  Filled 2016-10-02: qty 5

## 2016-10-02 MED ORDER — FENTANYL CITRATE (PF) 100 MCG/2ML IJ SOLN
INTRAMUSCULAR | Status: DC | PRN
  Administered 2016-10-02: 150 ug via INTRAVENOUS
  Administered 2016-10-02 (×2): 50 ug via INTRAVENOUS
  Administered 2016-10-02: 100 ug via INTRAVENOUS
  Administered 2016-10-02: 50 ug via INTRAVENOUS
  Administered 2016-10-02: 100 ug via INTRAVENOUS

## 2016-10-02 MED ORDER — GABAPENTIN 300 MG PO CAPS
300.0000 mg | ORAL_CAPSULE | Freq: Three times a day (TID) | ORAL | Status: DC
  Administered 2016-10-02 – 2016-10-03 (×3): 300 mg via ORAL
  Filled 2016-10-02 (×3): qty 1

## 2016-10-02 MED ORDER — ALLOPURINOL 100 MG PO TABS
100.0000 mg | ORAL_TABLET | Freq: Every day | ORAL | Status: DC
  Administered 2016-10-02 – 2016-10-03 (×2): 100 mg via ORAL
  Filled 2016-10-02 (×2): qty 1

## 2016-10-02 MED ORDER — BUPIVACAINE LIPOSOME 1.3 % IJ SUSP
20.0000 mL | Freq: Once | INTRAMUSCULAR | Status: DC
  Filled 2016-10-02: qty 20

## 2016-10-02 MED ORDER — SODIUM CHLORIDE 0.9% FLUSH
3.0000 mL | Freq: Two times a day (BID) | INTRAVENOUS | Status: DC
  Administered 2016-10-03: 3 mL via INTRAVENOUS

## 2016-10-02 MED ORDER — THROMBIN 20000 UNITS EX KIT
PACK | CUTANEOUS | Status: DC | PRN
  Administered 2016-10-02: 20 mL via TOPICAL

## 2016-10-02 MED ORDER — HYDROMORPHONE HCL 1 MG/ML IJ SOLN
0.2500 mg | INTRAMUSCULAR | Status: DC | PRN
  Administered 2016-10-02 (×2): 0.5 mg via INTRAVENOUS

## 2016-10-02 MED ORDER — SUCCINYLCHOLINE CHLORIDE 200 MG/10ML IV SOSY
PREFILLED_SYRINGE | INTRAVENOUS | Status: AC
  Filled 2016-10-02: qty 10

## 2016-10-02 MED ORDER — BUPIVACAINE HCL (PF) 0.5 % IJ SOLN
INTRAMUSCULAR | Status: AC
  Filled 2016-10-02: qty 20

## 2016-10-02 MED ORDER — NICOTINE 14 MG/24HR TD PT24
14.0000 mg | MEDICATED_PATCH | Freq: Every day | TRANSDERMAL | Status: DC
  Administered 2016-10-02 – 2016-10-03 (×2): 14 mg via TRANSDERMAL
  Filled 2016-10-02 (×2): qty 1

## 2016-10-02 MED ORDER — PHENYLEPHRINE HCL 10 MG/ML IJ SOLN
INTRAMUSCULAR | Status: DC | PRN
  Administered 2016-10-02: 30 ug/min via INTRAVENOUS

## 2016-10-02 MED ORDER — SIMVASTATIN 20 MG PO TABS
20.0000 mg | ORAL_TABLET | Freq: Every day | ORAL | Status: DC
  Administered 2016-10-02: 20 mg via ORAL
  Filled 2016-10-02 (×2): qty 1

## 2016-10-02 MED ORDER — LIDOCAINE 2% (20 MG/ML) 5 ML SYRINGE
INTRAMUSCULAR | Status: DC | PRN
  Administered 2016-10-02: 100 mg via INTRAVENOUS

## 2016-10-02 MED ORDER — EPHEDRINE 5 MG/ML INJ
INTRAVENOUS | Status: AC
  Filled 2016-10-02: qty 10

## 2016-10-02 MED ORDER — HYDROMORPHONE HCL 1 MG/ML IJ SOLN
INTRAMUSCULAR | Status: AC
  Filled 2016-10-02: qty 2

## 2016-10-02 MED ORDER — LIDOCAINE 2% (20 MG/ML) 5 ML SYRINGE
INTRAMUSCULAR | Status: AC
  Filled 2016-10-02: qty 5

## 2016-10-02 MED ORDER — BUPIVACAINE LIPOSOME 1.3 % IJ SUSP
INTRAMUSCULAR | Status: DC | PRN
  Administered 2016-10-02: 10 mL

## 2016-10-02 MED ORDER — CEFAZOLIN SODIUM-DEXTROSE 2-4 GM/100ML-% IV SOLN
2.0000 g | INTRAVENOUS | Status: AC
  Administered 2016-10-02: 2 g via INTRAVENOUS
  Filled 2016-10-02: qty 100

## 2016-10-02 MED ORDER — PANTOPRAZOLE SODIUM 40 MG PO TBEC
40.0000 mg | DELAYED_RELEASE_TABLET | Freq: Every day | ORAL | Status: DC
  Administered 2016-10-03: 40 mg via ORAL
  Filled 2016-10-02: qty 1

## 2016-10-02 MED ORDER — DEXAMETHASONE SODIUM PHOSPHATE 10 MG/ML IJ SOLN
INTRAMUSCULAR | Status: DC | PRN
  Administered 2016-10-02: 10 mg via INTRAVENOUS

## 2016-10-02 MED ORDER — DEXAMETHASONE SODIUM PHOSPHATE 10 MG/ML IJ SOLN
INTRAMUSCULAR | Status: AC
  Filled 2016-10-02: qty 1

## 2016-10-02 MED ORDER — HYDROCHLOROTHIAZIDE 12.5 MG PO CAPS
12.5000 mg | ORAL_CAPSULE | Freq: Every day | ORAL | Status: DC
  Administered 2016-10-02 – 2016-10-03 (×2): 12.5 mg via ORAL
  Filled 2016-10-02 (×2): qty 1

## 2016-10-02 MED ORDER — ROCURONIUM BROMIDE 100 MG/10ML IV SOLN
INTRAVENOUS | Status: DC | PRN
  Administered 2016-10-02 (×3): 10 mg via INTRAVENOUS
  Administered 2016-10-02: 50 mg via INTRAVENOUS

## 2016-10-02 MED ORDER — LACTATED RINGERS IV SOLN
INTRAVENOUS | Status: DC
  Administered 2016-10-02: 11:00:00 via INTRAVENOUS

## 2016-10-02 MED ORDER — PROPOFOL 10 MG/ML IV BOLUS
INTRAVENOUS | Status: DC | PRN
  Administered 2016-10-02: 130 mg via INTRAVENOUS

## 2016-10-02 MED ORDER — PHENYLEPHRINE 40 MCG/ML (10ML) SYRINGE FOR IV PUSH (FOR BLOOD PRESSURE SUPPORT)
PREFILLED_SYRINGE | INTRAVENOUS | Status: DC | PRN
  Administered 2016-10-02: 80 ug via INTRAVENOUS

## 2016-10-02 MED ORDER — ONDANSETRON HCL 4 MG/2ML IJ SOLN
INTRAMUSCULAR | Status: AC
  Filled 2016-10-02: qty 2

## 2016-10-02 MED ORDER — HYDROCHLOROTHIAZIDE 25 MG PO TABS: 25.0000 mg | ORAL_TABLET | Freq: Every day | ORAL | Status: DC

## 2016-10-02 MED ORDER — METHOCARBAMOL 500 MG PO TABS: 500.0000 mg | ORAL_TABLET | Freq: Four times a day (QID) | ORAL | Status: DC | PRN

## 2016-10-02 MED ORDER — PHENOL 1.4 % MT LIQD: 1.0000 | OROMUCOSAL | Status: DC | PRN

## 2016-10-02 MED ORDER — MOMETASONE FURO-FORMOTEROL FUM 100-5 MCG/ACT IN AERO
2.0000 | INHALATION_SPRAY | Freq: Two times a day (BID) | RESPIRATORY_TRACT | Status: DC
  Administered 2016-10-02 – 2016-10-03 (×2): 2 via RESPIRATORY_TRACT
  Filled 2016-10-02: qty 8.8

## 2016-10-02 MED ORDER — MENTHOL 3 MG MT LOZG: 1.0000 | LOZENGE | OROMUCOSAL | Status: DC | PRN

## 2016-10-02 MED ORDER — POLYETHYLENE GLYCOL 3350 17 G PO PACK: 17.0000 g | PACK | Freq: Every day | ORAL | Status: DC | PRN

## 2016-10-02 MED ORDER — ACETAMINOPHEN 650 MG RE SUPP: 650.0000 mg | RECTAL | Status: DC | PRN

## 2016-10-02 MED ORDER — CHLORHEXIDINE GLUCONATE 4 % EX LIQD: 60.0000 mL | Freq: Once | CUTANEOUS | Status: DC

## 2016-10-02 MED ORDER — PROPOFOL 10 MG/ML IV BOLUS
INTRAVENOUS | Status: AC
  Filled 2016-10-02: qty 20

## 2016-10-02 MED ORDER — LACTATED RINGERS IV SOLN
INTRAVENOUS | Status: DC | PRN
  Administered 2016-10-02 (×2): via INTRAVENOUS

## 2016-10-02 MED ORDER — SODIUM CHLORIDE 0.9 % IV SOLN: 250.0000 mL | INTRAVENOUS | Status: DC

## 2016-10-02 MED ORDER — SUGAMMADEX SODIUM 200 MG/2ML IV SOLN
INTRAVENOUS | Status: AC
  Filled 2016-10-02: qty 2

## 2016-10-02 MED ORDER — PHENYLEPHRINE 40 MCG/ML (10ML) SYRINGE FOR IV PUSH (FOR BLOOD PRESSURE SUPPORT)
PREFILLED_SYRINGE | INTRAVENOUS | Status: AC
  Filled 2016-10-02: qty 10

## 2016-10-02 MED ORDER — VALSARTAN-HYDROCHLOROTHIAZIDE 160-12.5 MG PO TABS: 1.0000 | ORAL_TABLET | Freq: Every day | ORAL | Status: DC

## 2016-10-02 SURGICAL SUPPLY — 49 items
BENZOIN TINCTURE PRP APPL 2/3 (GAUZE/BANDAGES/DRESSINGS) ×2 IMPLANT
BUR RND FLUTED 2.5 (BURR) IMPLANT
BUR SABER RD CUTTING 3.0 (BURR) IMPLANT
CANISTER SUCT 3000ML PPV (MISCELLANEOUS) ×2 IMPLANT
CLSR STERI-STRIP ANTIMIC 1/2X4 (GAUZE/BANDAGES/DRESSINGS) ×2 IMPLANT
COVER SURGICAL LIGHT HANDLE (MISCELLANEOUS) ×2 IMPLANT
DERMABOND ADVANCED (GAUZE/BANDAGES/DRESSINGS) ×1
DERMABOND ADVANCED .7 DNX12 (GAUZE/BANDAGES/DRESSINGS) ×1 IMPLANT
DRAPE HALF SHEET 40X57 (DRAPES) IMPLANT
DRAPE INCISE IOBAN 66X45 STRL (DRAPES) IMPLANT
DRAPE MICROSCOPE LEICA (MISCELLANEOUS) ×2 IMPLANT
DRAPE SURG 17X23 STRL (DRAPES) ×8 IMPLANT
DRSG MEPILEX BORDER 4X4 (GAUZE/BANDAGES/DRESSINGS) ×2 IMPLANT
DRSG MEPILEX BORDER 4X8 (GAUZE/BANDAGES/DRESSINGS) IMPLANT
DURAPREP 26ML APPLICATOR (WOUND CARE) ×2 IMPLANT
ELECT REM PT RETURN 9FT ADLT (ELECTROSURGICAL) ×2
ELECTRODE REM PT RTRN 9FT ADLT (ELECTROSURGICAL) ×1 IMPLANT
EVACUATOR 1/8 PVC DRAIN (DRAIN) IMPLANT
GLOVE BIOGEL PI IND STRL 8 (GLOVE) ×1 IMPLANT
GLOVE BIOGEL PI INDICATOR 8 (GLOVE) ×1
GLOVE ECLIPSE 9.0 STRL (GLOVE) ×2 IMPLANT
GLOVE ORTHO TXT STRL SZ7.5 (GLOVE) ×2 IMPLANT
GLOVE SURG 8.5 LATEX PF (GLOVE) ×2 IMPLANT
GOWN STRL REUS W/ TWL LRG LVL3 (GOWN DISPOSABLE) ×1 IMPLANT
GOWN STRL REUS W/TWL 2XL LVL3 (GOWN DISPOSABLE) ×4 IMPLANT
GOWN STRL REUS W/TWL LRG LVL3 (GOWN DISPOSABLE) ×1
KIT BASIN OR (CUSTOM PROCEDURE TRAY) ×2 IMPLANT
KIT ROOM TURNOVER OR (KITS) ×2 IMPLANT
NEEDLE SPNL 18GX3.5 QUINCKE PK (NEEDLE) ×4 IMPLANT
NS IRRIG 1000ML POUR BTL (IV SOLUTION) ×2 IMPLANT
PACK LAMINECTOMY ORTHO (CUSTOM PROCEDURE TRAY) ×2 IMPLANT
PAD ARMBOARD 7.5X6 YLW CONV (MISCELLANEOUS) ×4 IMPLANT
PATTIES SURGICAL .5 X.5 (GAUZE/BANDAGES/DRESSINGS) IMPLANT
PATTIES SURGICAL .75X.75 (GAUZE/BANDAGES/DRESSINGS) IMPLANT
PATTIES SURGICAL 1X1 (DISPOSABLE) IMPLANT
SPONGE LAP 4X18 X RAY DECT (DISPOSABLE) IMPLANT
SPONGE SURGIFOAM ABS GEL 100 (HEMOSTASIS) IMPLANT
SUT VIC AB 0 CT1 27 (SUTURE)
SUT VIC AB 0 CT1 27XBRD ANBCTR (SUTURE) IMPLANT
SUT VIC AB 1 CT1 27 (SUTURE)
SUT VIC AB 1 CT1 27XBRD ANBCTR (SUTURE) IMPLANT
SUT VIC AB 2-0 CT1 27 (SUTURE)
SUT VIC AB 2-0 CT1 TAPERPNT 27 (SUTURE) IMPLANT
SUT VIC AB 3-0 X1 27 (SUTURE) ×2 IMPLANT
SUT VICRYL 0 UR6 27IN ABS (SUTURE) ×2 IMPLANT
TOWEL OR 17X24 6PK STRL BLUE (TOWEL DISPOSABLE) ×2 IMPLANT
TOWEL OR 17X26 10 PK STRL BLUE (TOWEL DISPOSABLE) ×2 IMPLANT
TRAY FOLEY W/METER SILVER 16FR (SET/KITS/TRAYS/PACK) IMPLANT
WATER STERILE IRR 1000ML POUR (IV SOLUTION) ×2 IMPLANT

## 2016-10-02 NOTE — Discharge Instructions (Addendum)
    No lifting greater than 10 lbs. Avoid bending, stooping and twisting. Walk in house for first week them may start to get out slowly increasing distance up to one mile by 3 weeks post op. Keep incision dry for 3 days, may use tegaderm or similar water impervious dressing.  

## 2016-10-02 NOTE — Anesthesia Postprocedure Evaluation (Signed)
Anesthesia Post Note  Patient: Stacy [name]  Procedure(s) Performed: Procedure(s) (LRB): Right L5-S1 Lateral Recess Decompression  microdiscectomy (N/A)     Patient location during evaluation: PACU Anesthesia Type: General Level of consciousness: awake and alert Pain management: pain level controlled Vital Signs Assessment: post-procedure vital signs reviewed and stable Respiratory status: spontaneous breathing, nonlabored ventilation, respiratory function stable and patient connected to nasal cannula oxygen Cardiovascular status: blood pressure returned to baseline and stable Postop Assessment: no signs of nausea or vomiting Anesthetic complications: no    Last Vitals:  Vitals:   10/02/16 1640 10/02/16 1700  BP: 126/69   Pulse: 67 83  Resp: 10 (!) 9  Temp: (!) 36.4 C   SpO2: 96% (!) 89%    Last Pain:  Vitals:   10/02/16 1640  TempSrc:   PainSc: Asleep                 Dorraine Ellender DAVID

## 2016-10-02 NOTE — H&P (Signed)
Stacy [name] is an 56 y.o. female.   Chief Complaint: Low back pain and right leg pain HPI: Patient with history of right L5-S1 stenosis last H&P and above complaints presents for surgical intervention. Progressively worsening symptoms.  Failed conservative treatment.  Past Medical History:  Diagnosis Date  . Arthritis   . Arthrofibrosis of total knee replacement ([place])    right  . Asthma   . COPD (chronic obstructive pulmonary disease) ([place])   . Diabetes mellitus    Type II  . GERD (gastroesophageal reflux disease)    Pt on Protonix daily  . Glaucoma   . Gout   . Headache(784.0)    otc meds prn  . Hyperlipidemia   . Hypertension    Pt on lisinopril  . Irritable bowel syndrome 11/19/2010  . Neuropathy   . Pneumonia   . Restless legs   . Shortness of breath    occasional - uses breathing tx at home    Past Surgical History:  Procedure Laterality Date  . CHOLECYSTECTOMY    . COLONOSCOPY    . ENDOMETRIAL ABLATION  10/2010  . HERNIA REPAIR     umbicial hernia  . KNEE CLOSED REDUCTION Right 12/06/2015   Procedure: CLOSED MANIPULATION RIGHT KNEE;  Surgeon: [name], MD;  Location: [facility];  Service: Orthopedics;  Laterality: Right;  . KNEE CLOSED REDUCTION Right 01/17/2016   Procedure: CLOSED MANIPULATION RIGHT KNEE;  Surgeon: [name], MD;  Location: [facility];  Service: Orthopedics;  Laterality: Right;  . KNEE JOINT MANIPULATION Right 12/06/2015  . LUMBAR DISC SURGERY  06/03/2015   L 2  L3 L4 L5   . LUMBAR LAMINECTOMY/DECOMPRESSION MICRODISCECTOMY N/A 06/03/2015   Procedure: Bilateral lateral recess decompression L2-3, L3-4, L4-5;  Surgeon: [name], MD;  Location: [place];  Service: Orthopedics;  Laterality: N/A;  . svd      x 2  . TOTAL KNEE ARTHROPLASTY Right 09/06/2015   Procedure: RIGHT TOTAL KNEE ARTHROPLASTY;  Surgeon: [name], MD;  Location: [facility];  Service: Orthopedics;  Laterality: Right;  . TUBAL LIGATION    . UPPER GASTROINTESTINAL ENDOSCOPY  04/28/11     Family History  Problem Relation Age of Onset  . Hypertension Father   . Cancer Father   . Heart disease Mother   . Asthma Son        had as a child  . Heart disease Sister   . Breast cancer Sister   . Hypertension Brother    Social History:  reports that she has been smoking Cigarettes.  She has a 8.00 pack-year smoking history. She has never used smokeless tobacco. She reports that she does not drink alcohol or use drugs.  Allergies:  Allergies  Allergen Reactions  . Other Shortness Of Breath    UNSPECIFIED AGENTS Allergic to perfumes and cleaning products  . Ace Inhibitors Cough       . Aspirin Nausea Only    Medications Prior to Admission  Medication Sig Dispense Refill  . albuterol (PROVENTIL) (2.5 MG/3ML) 0.083% nebulizer solution USE 1 VIAL VIA NEBULIZER EVERY 4 HOURS AS NEEDED FOR WHEEZING 1650 mL 0  . allopurinol (ZYLOPRIM) 100 MG tablet Take 1 tablet (100 mg total) by mouth daily. 90 tablet 3  . Colchicine 0.6 MG CAPS Take 1 capsule by mouth daily. 90 capsule 3  . gabapentin (NEURONTIN) 300 MG capsule Take 1 capsule (300 mg total) by mouth 3 (three) times daily. 270 capsule 3  . hydrochlorothiazide (  HYDRODIURIL) 25 MG tablet Take 1 tablet (25 mg total) by mouth daily. 90 tablet 3  . HYDROcodone-acetaminophen (NORCO/VICODIN) 5-325 MG tablet Take 1 tablet by mouth every 6 (six) hours as needed for moderate pain. (Patient taking differently: Take 1 tablet by mouth every 6 (six) hours as needed for moderate pain. ) 40 tablet 0  . hydrocortisone-pramoxine (ANALPRAM-HC) 2.5-1 % rectal cream Place 1 application rectally 2 (two) times daily. (Patient taking differently: Place 1 application rectally 2 (two) times daily as needed for hemorrhoids. ) 30 g 0  . hydrOXYzine (ATARAX/VISTARIL) 10 MG tablet TAKE 1 TABLET BY MOUTH THREE TIMES DAILY AS NEEDED FOR ITCHING (Patient taking differently: Take 10 mg by mouth 3 (three) times daily as needed for itching. TAKE 1 TABLET BY MOUTH  THREE TIMES DAILY AS NEEDED FOR ITCHING) 30 tablet 2  . metFORMIN (GLUCOPHAGE-XR) 750 MG 24 hr tablet Take 1 tablet (750 mg total) by mouth daily with breakfast. 90 tablet 3  . methocarbamol (ROBAXIN) 500 MG tablet Take 1 tablet (500 mg total) by mouth every 6 (six) hours as needed for muscle spasms. 60 tablet 1  . Multiple Vitamin (MULTIVITAMIN WITH MINERALS) TABS tablet Take 1 tablet by mouth daily.    . pantoprazole (PROTONIX) 40 MG tablet Take 1 tablet (40 mg total) by mouth daily. 90 tablet 3  . simvastatin (ZOCOR) 20 MG tablet Take 1 tablet (20 mg total) by mouth at bedtime. 90 tablet 3  . SYMBICORT 80-4.5 MCG/ACT inhaler INHALE TWO PUFFS BY MOUTH TWICE A DAY 30.6 g 0  . valsartan-hydrochlorothiazide (DIOVAN-HCT) 160-12.5 MG tablet Take 1 tablet by mouth daily. 90 tablet 3  . acetaminophen-codeine (TYLENOL #3) 300-30 MG tablet Take 1 tablet by mouth every 4 (four) hours as needed. (Patient not taking: Reported on 09/29/2016) 90 tablet 0  . Blood Glucose Monitoring Suppl (ACCU-CHEK AVIVA PLUS) w/Device KIT 1 each by Does not apply route 3 (three) times daily. 1 kit 0  . EPINEPHrine 0.3 mg/0.3 mL IJ SOAJ injection Inject 0.3 mLs (0.3 mg total) into the muscle once. (Patient taking differently: Inject 0.3 mg into the muscle daily as needed (for allergic reaction). ) 1 Device 1  . glucose blood (TRUE METRIX BLOOD GLUCOSE TEST) test strip USE AS DIRECTED BY PHYSICIAN 100 each 12  . Lancets (ACCU-CHEK SOFT TOUCH) lancets Use as instructed 100 each 12  . mometasone-formoterol (DULERA) 100-5 MCG/ACT AERO Inhale 2 puffs into the lungs 2 (two) times daily. (Patient not taking: Reported on 09/29/2016) 1 Inhaler 3  . nicotine (NICODERM CQ - DOSED IN MG/24 HOURS) 14 mg/24hr patch Place 1 patch (14 mg total) onto the skin daily. (Patient not taking: Reported on 09/29/2016) 28 patch 3  . potassium chloride SA (K-DUR,KLOR-CON) 20 MEQ tablet Take 1 tablet (20 mEq total) by mouth daily. (Patient not taking: Reported  on 09/29/2016) 30 tablet 3    Results for orders placed or performed during the hospital encounter of 10/02/16 (from the past 48 hour(s))  Glucose, capillary     Status: None   Collection Time: 10/02/16 10:13 AM  Result Value Ref Range   Glucose-Capillary 99 65 - 99 mg/dL   Comment 1 Notify RN    Comment 2 Document in Chart   Comprehensive metabolic panel     Status: Abnormal   Collection Time: 10/02/16 10:33 AM  Result Value Ref Range   Sodium 140 135 - 145 mmol/L   Potassium 3.4 (L) 3.5 - 5.1 mmol/L   Chloride 102 101 -  111 mmol/L   CO2 29 22 - 32 mmol/L   Glucose, Bld 103 (H) 65 - 99 mg/dL   BUN 7 6 - 20 mg/dL   Creatinine, Ser 0.73 0.44 - 1.00 mg/dL   Calcium 9.1 8.9 - 10.3 mg/dL   Total Protein 6.6 6.5 - 8.1 g/dL   Albumin 3.7 3.5 - 5.0 g/dL   AST 23 15 - 41 U/L   ALT 18 14 - 54 U/L   Alkaline Phosphatase 80 38 - 126 U/L   Total Bilirubin 0.5 0.3 - 1.2 mg/dL   GFR calc non Af Amer >60 >60 mL/min   GFR calc Af Amer >60 >60 mL/min    Comment: (NOTE) The eGFR has been calculated using the CKD EPI equation. This calculation has not been validated in all clinical situations. eGFR's persistently <60 mL/min signify possible Chronic Kidney Disease.    Anion gap 9 5 - 15  Protime-INR     Status: None   Collection Time: 10/02/16 10:33 AM  Result Value Ref Range   Prothrombin Time 13.1 11.4 - 15.2 seconds   INR 1.00   APTT     Status: None   Collection Time: 10/02/16 10:33 AM  Result Value Ref Range   aPTT 30 24 - 36 seconds   No results found.  Review of Systems  Constitutional: Negative.   HENT: Negative.   Respiratory: Negative.   Cardiovascular: Negative.   Gastrointestinal: Negative.   Musculoskeletal: Positive for back pain.  Skin: Negative.     Blood pressure (!) 151/82, pulse 78, temperature 98.3 F (36.8 C), temperature source Oral, resp. rate 18, height 5' 3"  (1.6 m), weight 160 lb 3 oz (72.7 kg), SpO2 100 %. Physical Exam  Constitutional: She is  oriented to person, place, and time. She appears well-developed.  HENT:  Head: Normocephalic and atraumatic.  Eyes: Pupils are equal, round, and reactive to light. EOM are normal.  Neck: Normal range of motion.  Respiratory: No respiratory distress.  GI: There is no tenderness.  Musculoskeletal:  Back Exam   Tenderness  The patient is experiencing tenderness in the lumbar.  Range of Motion  Extension: abnormal  Flexion: abnormal  Lateral Bend Right: abnormal  Lateral Bend Left: abnormal  Rotation Right: abnormal  Rotation Left: abnormal   Muscle Strength  Right Quadriceps:  5/5  Left Quadriceps:  5/5  Right Hamstrings:  5/5   Tests  Straight leg raise right: positive Straight leg raise left: negative  Reflexes  Patellar: normal Achilles:  0/4 abnormal Babinski's sign: normal   Other  Toe Walk: abnormal Heel Walk: abnormal Sensation: decreased Gait: antalgic  Erythema: no back redness Scars: present  Comments:  Right PCT positive, Right SLR at 45 degrees.   Neurological: She is alert and oriented to person, place, and time.  Skin: Skin is warm and dry.  Psychiatric: She has a normal mood and affect.     Assessment/Plan Right L5-S1 stenosis/HNP.   We will proceed withRight L5-S1 Lateral Recess Decompression possible microdiscectomy as scheduled. Surgical procedure along with possible risks and complications discussed. All questions answered. Benjiman Core, PA-C 10/02/2016, 12:07 PM

## 2016-10-02 NOTE — Interval H&P Note (Signed)
History and Physical Interval Note:  10/02/2016 12:56 PM  Stacy [name]  has presented today for surgery, with the diagnosis of right L5-S1 lateral recess stenosis  The various methods of treatment have been discussed with the patient and family. After consideration of risks, benefits and other options for treatment, the patient has consented to  Procedure(s): Right L5-S1 Lateral Recess Decompression possible microdiscectomy (N/A) as a surgical intervention .  The patient's history has been reviewed, patient examined, no change in status, stable for surgery.  I have reviewed the patient's chart and labs.  Questions were answered to the patient's satisfaction.     Erline LevineJim Nitka

## 2016-10-02 NOTE — Transfer of Care (Signed)
Immediate Anesthesia Transfer of Care Note  Patient: Stacy [name]  Procedure(s) Performed: Procedure(s): Right L5-S1 Lateral Recess Decompression  microdiscectomy (N/A)  Patient Location: PACU  Anesthesia Type:General  Level of Consciousness: drowsy and patient cooperative  Airway & Oxygen Therapy: Patient Spontanous Breathing and Patient connected to nasal cannula oxygen  Post-op Assessment: Report given to RN, Post -op Vital signs reviewed and stable and Patient moving all extremities X 4  Post vital signs: Reviewed and stable  Last Vitals:  Vitals:   10/02/16 1018  BP: (!) 151/82  Pulse: 78  Resp: 18  Temp: 36.8 C  SpO2: 100%    Last Pain:  Vitals:   10/02/16 1058  TempSrc:   PainSc: 6       Patients Stated Pain Goal: 3 (10/02/16 1058)  Complications: No apparent anesthesia complications

## 2016-10-02 NOTE — Anesthesia Preprocedure Evaluation (Signed)
Anesthesia Evaluation  Patient identified by MRN, date of birth, ID band Patient awake    Reviewed: Allergy & Precautions, H&P , Patient's Chart, lab work & pertinent test results, reviewed documented beta blocker date and time   Airway Mallampati: II  TM Distance: >3 FB Neck ROM: full    Dental no notable dental hx.    Pulmonary Current Smoker,    Pulmonary exam normal breath sounds clear to auscultation       Cardiovascular hypertension,  Rhythm:regular Rate:Normal     Neuro/Psych    GI/Hepatic   Endo/Other  diabetes  Renal/GU      Musculoskeletal   Abdominal   Peds  Hematology   Anesthesia Other Findings Hypertension   GERD   Pt on Protonix daily Hyperlipidemia   Asthma   COPD    Shortness of breath occasional  breathing tx at home    Diabetes mellitus   Type II Neuropathy   Arthritis         Reproductive/Obstetrics                             Anesthesia Physical Anesthesia Plan  ASA: III  Anesthesia Plan: General   Post-op Pain Management:    Induction: Intravenous  PONV Risk Score and Plan:   Airway Management Planned: Oral ETT  Additional Equipment:   Intra-op Plan:   Post-operative Plan: Extubation in OR  Informed Consent: I have reviewed the patients History and Physical, chart, labs and discussed the procedure including the risks, benefits and alternatives for the proposed anesthesia with the patient or authorized representative who has indicated his/her understanding and acceptance.   Dental Advisory Given  Plan Discussed with: CRNA and Surgeon  Anesthesia Plan Comments: (  )        Anesthesia Quick Evaluation

## 2016-10-02 NOTE — Progress Notes (Signed)
Patient requests that son, Reginold AgentMarquez Elezer, be contacted after surgery.  He can be reached at [phone].   Her case manager is present at pre-op and can be called if needed.  Both patient and case manager requests that son be contacted first.  Shelda JakesVon Scovens, RN [phone]

## 2016-10-02 NOTE — Progress Notes (Addendum)
Patient seen in the PACU awake alert and oriented x 4. I called her husband Theresia BoughCharles Pettijohn at [phone] and left a message about having performed surgery at the level above the operative site. This site had undergone surgery last year as part of a multiple level hemilaminectomies bilateral L2-3, bilateral L3-4 and bilateral L4-5. We did proceed with the L5-S1 level after determination that we had performed the hemilaminectomy at the level above the intended level. In the recovery room she demonstrates normal motor, foot dorsiflexion and extension and knee flexion and extension. Mr. Melanee Spryeoples then called me back at the phone in the dictation area of the PACU, 1st  Phone. He Again was told of the surgery being done at the level above the L5-S1 level and the L5-S1 level.

## 2016-10-02 NOTE — Brief Op Note (Addendum)
10/02/2016  3:21 PM  PATIENT:  Stacy LinPamela [name]  56 y.o. female  PRE-OPERATIVE DIAGNOSIS:  right L5-S1 lateral recess stenosis  POST-OPERATIVE DIAGNOSIS:  right L5-S1 lateral recess stenosis  PROCEDURE:  Procedure(s): Right L5-S1 Lateral Recess Decompression possible microdiscectomy (N/A)  SURGEON:  Surgeon(s) and Role:    Kerrin ChampagneNitka, Debe Anfinson E, MD - Primary  PHYSICIAN ASSISTANT: Andee LinemanJames Owens,PA-C  ANESTHESIA:   local and general,  Dr. Cristela BlueKyle [facility]  EBL:  Total I/O In: 1000 [I.V.:1000] Out: 100 [Blood:100]  BLOOD ADMINISTERED:none   COMPLICATIONS: Right L4-5 laminotomy performed despite radiographs with Penfield#4 at the lower aspect of L5 pedicle and recognized with further intraoperative radiograph, the right L5-S1 level hemilaminectomy then accomplished.   DRAINS: Urinary Catheter (Foley) In and out at the end of the case.  LOCAL MEDICATIONS USED:  MARCAINE0.5% 1:1 EXPAREL 1.3% Amount:3030ml  SPECIMEN:  No Specimen  DISPOSITION OF SPECIMEN:  N/A  COUNTS:  YES  TOURNIQUET:  * No tourniquets in log *  DICTATION: .Dragon Dictation  PLAN OF CARE: Admit for overnight observation  PATIENT DISPOSITION:  PACU - hemodynamically stable.   Delay start of Pharmacological VTE agent (>24hrs) due to surgical blood loss or risk of bleeding: yes

## 2016-10-02 NOTE — Anesthesia Procedure Notes (Signed)
Procedure Name: Intubation Date/Time: 10/02/2016 1:12 PM Performed by: Julian ReilWELTY, Denine Brotz F Pre-anesthesia Checklist: Patient identified, Emergency Drugs available, Suction available, Patient being monitored and Timeout performed Patient Re-evaluated:Patient Re-evaluated prior to induction Oxygen Delivery Method: Circle system utilized Preoxygenation: Pre-oxygenation with 100% oxygen Induction Type: IV induction Ventilation: Mask ventilation without difficulty Laryngoscope Size: Miller and 3 Grade View: Grade I Tube type: Oral Tube size: 7.0 mm Number of attempts: 1 Airway Equipment and Method: Stylet Placement Confirmation: ETT inserted through vocal cords under direct vision,  positive ETCO2 and breath sounds checked- equal and bilateral Secured at: 22 cm Tube secured with: Tape Dental Injury: Teeth and Oropharynx as per pre-operative assessment

## 2016-10-02 NOTE — Op Note (Addendum)
10/02/2016  3:35 PM  PATIENT:  Stacy [name]  56 y.o. female  MRN: [id number]  OPERATIVE REPORT  PRE-OPERATIVE DIAGNOSIS:  right L5-S1 lateral recess stenosis  POST-OPERATIVE DIAGNOSIS:  right L5-S1 lateral recess stenosis  PROCEDURE:  Procedure(s): Right L5-S1 Lateral Recess Decompression  microdiscectomy    SURGEON:  [name], MD     ASSISTANT:  Benjiman Core, PA-C  (Present throughout the entire procedure and necessary for completion of procedure in a timely manner)     ANESTHESIA:  General,supplemented with local anesthetic  Marcaine 0.5% 1:1 exparel 1.3% total 20 CC.    COMPLICATIONS: Right O2-7 laminotomy performed despite radiographs with Penfield#4 at the lower aspect of L5 pedicle and recognized with further intraoperative radiograph, the right L5-S1 level hemilaminectomy then accomplished.     DRAINS: Urinary Catheter (Foley) In and out at the end of the case.  PROCEDURE:The patient was met in the holding area, and the appropriate Right Lumbar level L5-S1  identified and marked with "x" and my initials.The patient was then transported to OR and was placed under general anesthesia without difficulty. The patient received appropriate preoperative antibiotic prophylaxis The patient after intubation atraumatically was transferred to the operating room table, prone position, Wilson frame, sliding OR table. All pressure points were well padded. The arms in 90-90 well-padded at the elbows. Standard prep with DuraPrep solution lower dorsal spine to the mid sacral segment. Draped in the usual manner iodine Vi-Drape was used. Time-out procedure was called and correct. 2x 18-gauge spinal needle was then inserted at the expected L5-S1 level. C-arm was draped sterilely to the field and used to identify the spinal needles positions. The needle was at the lower aspect of the lamina of L5. Skin superior to this was then infiltrated with Marcaine half percent with 1:1 exparel 1.3% total of 20  cc used. An incision approximately an inch inch and a half in length was then made through skin and subcutaneous layers in line with the right side of the expected midline just superior to the spinal needle entry point. An incision made into the right lumbosacral fascia approximately an inch in length .  Smallest Cobb elevator was then introduced into the incision site and used to carefully form subperiosteal movement of the right paralumbar muscles off of the posterior lamina of the expected right L5-S1 level.  The depth measured off of the dilators at about 50 mm and 50 mm retractors and placed on the scaffolding for the [facility] equipment and guided down to and docking on the posterior aspect of the lamina at the expected L5-S1 level. This was sterilely attached to the articulating arm and it's up right which had been attached the OR table sterilely. C-arm fluoroscopy was identified the retractors and a Penfield #4 at the lower margin of the L5 pedicle at the appropriate level L5-S1.. The operating room microscope sterilely draped brought into the field. Under the operating room microscope, the L5-S1 interspace carefully debrided the small amount of muscle attachment here and high-speed bur used to drill the medial aspect of the inferior articular process of L5 approximately 10%. A localization lateral C-arm view was obtained with Penfield 4 in the L5-S1 facet. 2 mm Kerrison then used to enter the spinal canal over the superior aspect of the S1 lamina carefully using the Kerrison to debris the attachment as a curet. Foraminotomy was then performed over the S1 nerve root. The medial 10% superior articular process of S1 and then resected using an osteotome  and 2 mm Kerrison. This allowed for identification of the thecal sac. Penfield 4 was then used to carefully mobilize the thecal sac medially and the S1 nerve root identified within the lateral recess flattened over the posterior aspect of the disc.  Carefully the lateral aspect of the S1 nerve root was identified and a Penfield 4 was used to mobilize the nerve medially such that the disc was visible with microscope. Using a Penfield 4 for retraction, the disc was evaluated and no disc herniation noted.  Further foraminotomies was performed over the L5 nerve root the nerve root was noted to be decompressed. The nerve root able to be retracted along the medial aspect of the S1pedicle and disc material found to be subligamentous at this level was further resected current pituitary rongeurs. An intraoperative radiograph was obtained with a Penfield#4 at the pedicle level and observed to be the right L5 pedicle and therefore the next level lower was exposed and the right L5-S1 microdiscectomy at the  Originally decided level was carried out.  Soft tissue over the right L5-S1 lamina was further debrided inferiorly to the level L5-S1 interlaminar space. A moderate amount of resection of the right L5 lamina inferiorly was performed.  High-speed bur and then used to carefully drill inferior 3 or 4 mm of the right side L5 lamina and on the medial aspect of the right L5 inferior articular process of 3 mm. The superior margin of the S1 lamina then carefully debrided with curette and a 2 mm osteotome then used and the spinal canal over the superior aspect of the S1 lamina resecting bone over the superior aspect and freeing up the attachment of ligamentum flavum here. Ligamentum flavum then debrided with the 3 mm Kerrison we performed of the S1 nerve root and the lateral recess decompressed using 2 and 3 mm Kerrisons sizing hypertrophic reflected ligamentum flavum extending superiorly. From was resected off the ventral aspect of the inferior margin of the L5 lamina. Hockey-stick nerve probe could then be passed out the L5 neuroforamen of the S1 neuroforamen. Venous bleeding encountered. Thrombin-soaked Gelfoam used to control this following this then the sac and the S1  nerve root were mobilized medially and the L5-S1 disc examined and found  to be herniated. The disc was incised viertically with a 15 blade knife and disc material extruded and was removed with a micropituitary ronguer. Further disc was excised with micropituitaries and down biting pituitary, Woodson use to apply downward pressure over the posterior disc and the foramenal portion of the right disc and more disc was able to extruded out of the discotomy area. The disc space was enterred and further disc removed until no further fragments were present. Irrigation was carried out down to this bleeding controlled with Gelfoam. Gelfoam was then removed. Irrigation carried careful examination demonstrated no active bleeding present. Retractors were then carefully removed Since carefully then the Bleeding was then controlled using thrombin-soaked Gelfoam small cottonoids. Small amount of bleeding within the soft tissue mass the laminotomy area was controlled using bipolar electrocautery. Irrigation was carried out using copious amounts of irrigant solution. All Gelfoam were then removed. No significant active bleeding present at the time of removal. All instruments sponge counts were correct traction system was then carefully removed carefully rotating retractors with this withdrawal and only bipolar electrocautery of any small bleeders. Lumbodorsal fascia was then carefully approximated with interrupted 0 Vicryl sutures, UR 6 needle deep subcutaneous layers were approximated with interrupted 0 Vicryl sutures on UR  6 the appear subcutaneous layers approximated with interrupted 2-0 Vicryl sutures and the skin closed with a running subcutaneous stitch of 4-0 Vicryl. Dermabond was applied allowed to dry and then Mepilex bandage applied. Patient was then carefully returned to supine position on a stretcher, reactivated and extubated. He was then returned to recovery room in satisfactory condition.  Benjiman Core, PA-C perform  the duties of assistant surgeon during this case. He was present from the beginning of the case to the end of the case assisting in transfer the patient from his stretcher to the OR table and back to the stretcher at the end of the case. Assisted in careful retraction and suction of the laminectomy site delicate neural structures operating under the operating room microscope. He performed closure of the incision from the fascia to the skin applying the dressing.      Sharyne Richters  10/02/2016, 3:35 PM

## 2016-10-03 DIAGNOSIS — M5116 Intervertebral disc disorders with radiculopathy, lumbar region: Secondary | ICD-10-CM | POA: Diagnosis not present

## 2016-10-03 LAB — GLUCOSE, CAPILLARY: Glucose-Capillary: 174 mg/dL — ABNORMAL HIGH (ref 65–99)

## 2016-10-03 MED ORDER — METHOCARBAMOL 500 MG PO TABS: 500.0000 mg | ORAL_TABLET | Freq: Three times a day (TID) | ORAL | 1 refills | Status: DC | PRN

## 2016-10-03 MED ORDER — HYDROCODONE-ACETAMINOPHEN 7.5-325 MG PO TABS: 1.0000 | ORAL_TABLET | Freq: Four times a day (QID) | ORAL | 0 refills | Status: DC | PRN

## 2016-10-03 NOTE — Progress Notes (Addendum)
Patient ID: Tami LinPamela Knieriem, female   DOB: February 18, 1960, 56 y.o.   MRN: [id number] October 02, 2016  Patient: Tami Linamela Delatorre  MRN: [id number]  Date of Birth: February 18, 1960  Date of Visit: 10/02/2016    To Whom It May Concern :  Mrs. Tami Linamela Patron has requested this letter confirming the need to change domicile to an apartment or living place on the ground floor. Currently she reports living on an upper floor apartment.  Mrs Melanee Spryeoples has been a patient of mine for nearly 2 1/2 years. Her physical conditions involve both knee osteoarthritis, she is post right total knee replacement with residual stiffness. She also has undergone lumbar surgeries twice. The most recent today 10/02/2016.  Her conditions cause significant hardship when walking up or down stairs due to both Lumbar disc and knee disease.  I recommend that she be allowed to be placed in a ground floor apartment or residence to decrease the discomfort associated with her current conditions. If there are any questions or concerns regarding the need for this or this patient's physical condition please contact Mrs. Tami LinPamela Perrot and request that she sign a form releasing us to provide information as is needed.            Sincerely,  Kerrin ChampagneJames E Nitka

## 2016-10-03 NOTE — Evaluation (Signed)
Occupational Therapy Evaluation Patient Details Name: Stacy [name] MRN: [id number] DOB: 11/15/1960 Today's Date: 10/03/2016    History of Present Illness 56 y.o. female presenting with lower back pain and right leg pain. Diagnosised withright L5-S1 lateral recess stenosi. Underwent Right L5-S1 Lateral Recess Decompression microdiscectomy and L4-5 laminotomy  on 10/02/16. PMH including COPD, DM2 with neuropathy, R TKA, Gout, arthrtis, GERD, asthma, and prior back surgery.    Clinical Impression   PTA, pt was living with her husband and was independent with ADLs and light IADLs. Pt currently performing ADLs and functional mobility at a Min Guard-Min A level with use of AE and DME. Provided education on back precautions, log roll for bed mobility, and AE for LB ADLs. Provided education and answered pt's questions in preparation for dc later today. Recommend dc home once medically stable per physician. All acute OT needs met and will sign off.     Follow Up Recommendations  No OT follow up;Supervision - Intermittent    Equipment Recommendations  None recommended by OT    Recommendations for Other Services PT consult     Precautions / Restrictions Precautions Precautions: Back Precaution Booklet Issued: Yes (comment) Precaution Comments: Reviewed handout and pt able to recalled 3/3 back precatuions Restrictions Weight Bearing Restrictions: No      Mobility Bed Mobility Overal bed mobility: Modified Independent             General bed mobility comments: Practied log roll technique. Pt demonstrating good understanding  Transfers Overall transfer level: Needs assistance Equipment used: Rolling walker (2 wheeled) Transfers: Sit to/from Stand Sit to Stand: Supervision         General transfer comment: Supervision for safety    Balance Overall balance assessment: Needs assistance   Sitting balance-Leahy Scale: Good       Standing balance-Leahy Scale: Fair Standing  balance comment: Pt able to maintain standing while performing LB dressing without UE support                           ADL either performed or assessed with clinical judgement   ADL Overall ADL's : Needs assistance/impaired Eating/Feeding: Set up;Sitting   Grooming: Min guard;Standing   Upper Body Bathing: Set up;Supervision/ safety;Sitting   Lower Body Bathing: Min guard;Sit to/from stand;Adhering to back precautions;Cueing for sequencing;With adaptive equipment   Upper Body Dressing : Set up;Supervision/safety;Sitting;Cueing for compensatory techniques Upper Body Dressing Details (indicate cue type and reason): Pt donned bra and shirt with set up at EOB.  MIn verbcal cues to adhere to back precations (no twisting) Lower Body Dressing: Min guard;Minimal assistance;With adaptive equipment;Adhering to back precautions;Sit to/from stand Lower Body Dressing Details (indicate cue type and reason): Min Guard A for safety while donning underwear and pants with AE. Min A for donning and tying shoes with shoe horn. Pt requiring Min A and VCs to problem solve use of shoe horn.  Toilet Transfer: RW;Ambulation;Supervision/safety (Simulated to recliner)     Toileting - Clothing Manipulation Details (indicate cue type and reason): Discussed safe toileting and adherance to back precautions     Functional mobility during ADLs: Rolling walker;Min guard General ADL Comments: Pt performed ADLs and functional mobility at [place] level with AE and DME.      Vision Baseline Vision/History: Wears glasses ("I have glaucoma ot cataracts or something.") Wears Glasses: At all times Patient Visual Report: No change from baseline;Other (comment) (reports she see spots sometimes at baseline)  Perception     Praxis      Pertinent Vitals/Pain Pain Assessment: No/denies pain     Hand Dominance Left   Extremity/Trunk Assessment Upper Extremity Assessment Upper Extremity  Assessment: Overall WFL for tasks assessed   Lower Extremity Assessment Lower Extremity Assessment: Defer to PT evaluation   Cervical / Trunk Assessment Cervical / Trunk Assessment: Other exceptions (s/p back surgery)   Communication Communication Communication: No difficulties   Cognition Arousal/Alertness: Awake/alert Behavior During Therapy: WFL for tasks assessed/performed Overall Cognitive Status: Within Functional Limits for tasks assessed                                     General Comments  Issues AE for LB ADLs    Exercises     Shoulder Instructions      Home Living Family/patient expects to be discharged to:: Private residence Living Arrangements: Spouse/significant other Available Help at Discharge: Family;Friend(s) Type of Home: Apartment Home Access: Stairs to enter [employer] of Steps: 2 Entrance Stairs-Rails: Right Home Layout: Two level Alternate Level Stairs-Number of Steps: 13 Alternate Level Stairs-Rails: Left Bathroom Shower/Tub: Teacher, early years/pre: Standard Bathroom Accessibility: Yes How Accessible: Accessible via walker Home Equipment: [place] - 2 wheels;Tub bench;Bedside commode;Cane - single point   Additional Comments: Pt planning to get first floor apt      Prior Functioning/Environment Level of Independence: Independent with assistive device(s)        Comments: Uses RW/cane for functional mobility. Performs ADLs, light IADLs, and driving only to dr apt.         OT Problem List: Decreased range of motion;Decreased activity tolerance;Impaired balance (sitting and/or standing);Decreased safety awareness;Decreased knowledge of use of DME or AE;Decreased knowledge of precautions;Pain      OT Treatment/Interventions:      OT Goals(Current goals can be found in the care plan section) Acute Rehab OT Goals Patient Stated Goal: Go home today OT Goal Formulation: With patient Time For Goal  Achievement: 10/17/16 Potential to Achieve Goals: Good  OT Frequency:     Barriers to D/C:            Co-evaluation              AM-PAC PT "6 Clicks" Daily Activity     Outcome Measure Help from another person eating meals?: None Help from another person taking care of personal grooming?: None Help from another person toileting, which includes using toliet, bedpan, or urinal?: A Little Help from another person bathing (including washing, rinsing, drying)?: A Little Help from another person to put on and taking off regular upper body clothing?: None Help from another person to put on and taking off regular lower body clothing?: A Little 6 Click Score: 21   End of Session Equipment Utilized During Treatment: Gait belt;Rolling walker Nurse Communication: Mobility status;Other (comment) (Ready for dc)  Activity Tolerance: Patient tolerated treatment well Patient left: in chair;with call bell/phone within reach  OT Visit Diagnosis: Unsteadiness on feet (R26.81);Pain Pain - Right/Left:  (Bilateral) Pain - part of body:  (Back)                Time: 7341-9379 OT Time Calculation (min): 29 min Charges:  OT General Charges $OT Visit: 1 Visit OT Evaluation $OT Eval Low Complexity: 1 Low OT Treatments $Self Care/Home Management : 8-22 mins G-Codes: OT G-codes **NOT FOR INPATIENT CLASS** Functional Assessment Tool Used:  Clinical judgement Functional Limitation: Self care Self Care Current Status [phone]): At least 1 percent but less than 20 percent impaired, limited or restricted Self Care Goal Status (N5583): 0 percent impaired, limited or restricted Self Care Discharge Status [phone]): At least 1 percent but less than 20 percent impaired, limited or restricted   [place], OTR/L Acute Rehab Pager: [phone] Office: [place] 10/03/2016, 11:02 AM

## 2016-10-03 NOTE — Progress Notes (Signed)
     Subjective: 1 Day Post-Op Procedure(s) (LRB): Right L5-S1 Lateral Recess Decompression  microdiscectomy (N/A) Awake, alert and oriented x 4.   Patient reports pain as moderate.    Objective:   VITALS:  Temp:  [97.3 F (36.3 C)-98.3 F (36.8 C)] 98.3 F (36.8 C) (09/01 0325) Pulse Rate:  [67-101] 70 (09/01 0325) Resp:  [9-18] 14 (08/31 1725) BP: (101-151)/(47-88) 110/69 (09/01 0325) SpO2:  [89 %-100 %] 95 % (09/01 0325) Weight:  [160 lb 3 oz (72.7 kg)] 160 lb 3 oz (72.7 kg) (08/31 1018)  Neurologically intact ABD soft Neurovascular intact Sensation intact distally Intact pulses distally Dorsiflexion/Plantar flexion intact Incision: scant drainage   LABS  Recent Labs  10/01/16 0835  HGB 11.4*  WBC 5.6  PLT 229    Recent Labs  10/01/16 0835 10/02/16 1033  NA 138 140  K 3.3* 3.4*  CL 100* 102  CO2 29 29  BUN 11 7  CREATININE 0.77 0.73  GLUCOSE 89 103*    Recent Labs  10/02/16 1033  INR 1.00     Assessment/Plan: 1 Day Post-Op Procedure(s) (LRB): Right L5-S1 Lateral Recess Decompression  microdiscectomy (N/A)  Advance diet Up with therapy D/C IV fluids Discharge home with home health  Erline LevineJim Nesreen Albano 10/03/2016, 9:00 AM Patient ID: Tami LinPamela Canche, female   DOB: 10-29-1960, 56 y.o.   MRN: [id number]

## 2016-10-03 NOTE — Care Management Note (Signed)
Case Management Note  Patient Details  Name: Stacy [name] MRN: [id number] Date of Birth: Oct 08, 1960  Subjective/Objective:          [facility], would cost $195 out of pocket for each session. Could not et through to patient's room, spoke with bedside RN Mat, requested he update pt.        No other CM needs identified at this time.    Action/Plan:   Expected Discharge Date:  10/03/16               Expected Discharge Plan:  Home/Self Care  In-House Referral:     Discharge planning Services  CM Consult  Post Acute Care Choice:    Choice offered to:     DME Arranged:    DME Agency:     HH Arranged:    HH Agency:     Status of Service:  Completed, signed off  If discussed at [employer] Length of Stay Meetings, dates discussed:    Additional Comments:  Lawerance SabalDebbie Aquanetta Schwarz, RN 10/03/2016, 10:18 AM

## 2016-10-03 NOTE — Evaluation (Signed)
Physical Therapy Evaluation Patient Details Name: Stacy [name] MRN: [id number] DOB: [birth year] Today's Date: 10/03/2016   History of Present Illness  56 y.o. female presenting with lower back pain and right leg pain. Diagnosised withright L5-S1 lateral recess stenosi. Underwent Right L5-S1 Lateral Recess Decompression microdiscectomy and L4-5 laminotomy  on 10/02/16. PMH including COPD, DM2 with neuropathy, R TKA, Gout, arthrtis, GERD, asthma, and prior back surgery.   Clinical Impression  Patient did great with mobility, modified independent for all mobility.  Reviewed back precautions and up/down stairs.  Feel patient safe for d/c home from PT standpoint.    Follow Up Recommendations Outpatient PT (when cleared by MD)    Equipment Recommendations       Recommendations for Other Services       Precautions / Restrictions Precautions Precautions: Back      Mobility  Bed Mobility Overal bed mobility: Modified Independent                Transfers Overall transfer level: Modified independent                  Ambulation/Gait Ambulation/Gait assistance: Modified independent (Device/Increase time) Ambulation Distance (Feet): 100 Feet Assistive device: Rolling walker (2 wheeled) Gait Pattern/deviations: Step-through pattern        Stairs Stairs: Yes Stairs assistance: Supervision Stair Management: One rail Right Number of Stairs: 4    Wheelchair Mobility    Modified Rankin (Stroke Patients Only)       Balance Overall balance assessment: Needs assistance   Sitting balance-Leahy Scale: Good       Standing balance-Leahy Scale: Poor Standing balance comment: used RW for balance;                             Pertinent Vitals/Pain Pain Assessment: No/denies pain    Home Living Family/patient expects to be discharged to:: Private residence Living Arrangements: Spouse/significant other Available Help at Discharge:  Family;Friend(s) Type of Home: Apartment Home Access: Stairs to enter Entrance Stairs-Rails: Right Entrance Stairs-Number of Steps: 2 Home Layout: Two level Home Equipment: Walker - 2 wheels;Tub bench;Bedside commode Additional Comments: Pt can stay downstairs in home    Prior Function Level of Independence: Independent               Hand Dominance   Dominant Hand: Left    Extremity/Trunk Assessment        Lower Extremity Assessment Lower Extremity Assessment: Overall WFL for tasks assessed       Communication      Cognition Arousal/Alertness: Awake/alert Behavior During Therapy: WFL for tasks assessed/performed Overall Cognitive Status: Within Functional Limits for tasks assessed                                        General Comments      Exercises     Assessment/Plan    PT Assessment All further PT needs can be met in the next venue of care  PT Problem List Decreased activity tolerance;Decreased balance;Decreased mobility       PT Treatment Interventions      PT Goals (Current goals can be found in the Care Plan section)  Acute Rehab PT Goals PT Goal Formulation: All assessment and education complete, DC therapy    Frequency     Barriers to discharge  Co-evaluation               AM-PAC PT "6 Clicks" Daily Activity  Outcome Measure Difficulty turning over in bed (including adjusting bedclothes, sheets and blankets)?: None Difficulty moving from lying on back to sitting on the side of the bed? : A Little Difficulty sitting down on and standing up from a chair with arms (e.g., wheelchair, bedside commode, etc,.)?: None Help needed moving to and from a bed to chair (including a wheelchair)?: None Help needed walking in hospital room?: None Help needed climbing 3-5 steps with a railing? : A Little 6 Click Score: 22    End of Session Equipment Utilized During Treatment: Gait belt Activity Tolerance: Patient  tolerated treatment well Patient left: in bed;with call bell/phone within reach   PT Visit Diagnosis: Unsteadiness on feet (R26.81)    Time: 1000-1015 PT Time Calculation (min) (ACUTE ONLY): 15 min   Charges:   PT Evaluation $PT Eval Low Complexity: 1 Low     PT G Codes:   PT G-Codes **NOT FOR INPATIENT CLASS** Functional Assessment Tool Used: AM-PAC 6 Clicks Basic Mobility Functional Limitation: Mobility: Walking and moving around Mobility: Walking and Moving Around Current Status (J0312): At least 1 percent but less than 20 percent impaired, limited or restricted Mobility: Walking and Moving Around Goal Status [phone]): At least 1 percent but less than 20 percent impaired, limited or restricted Mobility: Walking and Moving Around Discharge Status [phone]): At least 1 percent but less than 20 percent impaired, limited or restricted    10/03/2016 [facility], PT [place], [place] 10/03/2016, 10:26 AM

## 2016-10-06 ENCOUNTER — Encounter (HOSPITAL_COMMUNITY): Payer: Self-pay | Admitting: Specialist

## 2016-10-06 MED FILL — Thrombin For Soln 20000 Unit: CUTANEOUS | Qty: 1 | Status: AC

## 2016-10-07 ENCOUNTER — Ambulatory Visit: Payer: Medicaid Other | Admitting: Podiatry

## 2016-10-07 ENCOUNTER — Encounter: Payer: Self-pay | Admitting: Family Medicine

## 2016-10-07 ENCOUNTER — Ambulatory Visit (INDEPENDENT_AMBULATORY_CARE_PROVIDER_SITE_OTHER): Payer: Medicaid Other | Admitting: Family Medicine

## 2016-10-07 VITALS — BP 104/58 | HR 93 | Temp 99.3°F | Ht 63.0 in | Wt 164.2 lb

## 2016-10-07 DIAGNOSIS — L661 Lichen planopilaris, unspecified: Secondary | ICD-10-CM | POA: Insufficient documentation

## 2016-10-07 MED ORDER — FLUOCINONIDE 0.05 % EX SOLN: 1.0000 "application " | Freq: Two times a day (BID) | CUTANEOUS | 0 refills | Status: DC

## 2016-10-07 NOTE — Patient Instructions (Addendum)
It was a pleasure meeting you today.   Today we discussed your history of hair loss. 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   Today we prescribed you a topical corticosteroid solution to be used. I have sent it to the pharmacy and it should be ready for pick up soon. If you notice any side effects of this solution please call the clinic or contact your PCP.   Please follow up in 4 weeks or sooner if symptoms persist or worsen. You have a follow up appointment on 10/25 @ 3:45 pm. Please arrive 15 min early.   Thank you,  Oralia ManisSherin [name], DO

## 2016-10-07 NOTE — Progress Notes (Signed)
   Subjective:    Patient ID: Stacy LinPamela Coonrod, female    DOB: [birth year], 56 y.o.   MRN: [id number]   CC: Alopecia  HPI: Hair loss Patient has been experiencing hair loss x 6 mo in duration. Patient had informed PCP who originally diagnosed patient with dry skin prior to referral to dermatology clinic. Patient describes hair loss in occipital area and around hair line. Patient endorses associated itching, burning, and tenderness to the area of hair loss. Patient denies any prior history of hair loss in both herself or family (female and female). Patient states area has not increased in size recently and has remained in the same location. Patient finds it difficult to comb hair due to pain. Patient notes recent weight gain, 10lbs in 2 months, but states this may be associated to recent quitting of smoking and increase in snacking. Patient began menopause in 2010/11 but has had more hot flashes recently. Patient endorses increased urination and bowel movements. Patient denies tachycardia. No history of acne. Nails are hard and strong without pitting. Patient reports no rashes. No fever, no chills.   No prior history of thyroid disease, but has a niece with unknown thyroid problems. No history of other autoimmune disorders. No recent herbal or supplement use. Patient uses suave shampoo, but has used shampoo her entire life. No recent hair product changes. Patient uses vaseline in hair to help with dryness.   Patient has history of sores and pustular drainage of sores as a child but cannot remember diagnosis.   Smoking status reviewed. Trying to quit. No smoking since last Wednesday. Eating more   Review of Systems All negative other than noted in HPI   Objective:  BP (!) 104/58   Pulse 93   Temp 99.3 F (37.4 C) (Oral)   Ht 5\' 3"  (1.6 m)   Wt 164 lb 3.2 oz (74.5 kg)   SpO2 96%   BMI 29.09 kg/m  Vitals and nursing note reviewed  General: well nourished, in no acute distress HEENT:  normocephalic, scleral icterus, no nasal discharge, moist mucous membranes, no erythema or discharge noted in posterior oropharynx Neck: supple Cardiac: RRR, clear S1 and S2, no murmurs, rubs, or gallops Respiratory: clear to auscultation bilaterally, no increased work of breathing Abdomen: soft, nontender, nondistended, no masses or organomegaly. Bowel sounds present Extremities: no edema or cyanosis. Warm, well perfused. 2+ radial pulses Skin: warm and dry, excoriates throughout arms, dark circular rashes noted on upper arms bilaterally, skin on occip (visualized below) showing excoriations and scarring. Hair loss noted in occiput.  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 Less likely alopecia areata given pattern of hair loss and itching/burning sensation. No history of hair pulling so unlikely trichotillomania. Unlikely androgenic in source given pattern of hair loss.  -fluocinonide solution one application bid -follow up in 4 weeks or sooner if symptoms persist or worsen -deferred biopsy at the moment due to increased tenderness to palpation -will plan for biopsy to confirm diagnosis at follow up appointment    Return in about 4 weeks (around 11/04/2016).   Oralia ManisSherin Hally Colella, DO, PGY-1

## 2016-10-07 NOTE — Assessment & Plan Note (Addendum)
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 Less likely alopecia areata given pattern of hair loss and itching/burning sensation. No history of hair pulling so unlikely trichotillomania. Unlikely androgenic in source given pattern of hair loss.  -fluocinonide solution one application bid -follow up in 4 weeks or sooner if symptoms persist or worsen -deferred biopsy at the moment due to increased tenderness to palpation -will plan for biopsy to confirm diagnosis at follow up appointment

## 2016-10-07 NOTE — Discharge Summary (Signed)
Physician Discharge Summary      Patient ID: Stacy [name] MRN: [id number] DOB/AGE: 57-Jul-1962 57 y.o.  Admit date: 10/02/2016 Discharge date: 10/03/2016  Admission Diagnoses:  Principal Problem:   Herniation of lumbar intervertebral disc with radiculopathy Active Problems:   Status post laminectomy   Discharge Diagnoses:  Same  Past Medical History:  Diagnosis Date  . Arthritis   . Arthrofibrosis of total knee replacement ([place])    right  . Asthma   . COPD (chronic obstructive pulmonary disease) ([place])   . Diabetes mellitus    Type II  . GERD (gastroesophageal reflux disease)    Pt on Protonix daily  . Glaucoma   . Gout   . Headache(784.0)    otc meds prn  . Hyperlipidemia   . Hypertension    Pt on lisinopril  . Irritable bowel syndrome 11/19/2010  . Neuropathy   . Pneumonia   . Restless legs   . Shortness of breath    occasional - uses breathing tx at home    Surgeries: Procedure(s): Right L5-S1 Lateral Recess Decompression  microdiscectomy on 10/02/2016   Consultants:   Discharged Condition: Improved  Hospital Course: Stacy [name] is an 56 y.o. female who was admitted 10/02/2016 with a chief complaint of No chief complaint on file. , and found to have a diagnosis of Herniation of lumbar intervertebral disc with radiculopathy.  She was brought to the operating room on 10/02/2016 and underwent the above named procedures.    She was given perioperative antibiotics:  Anti-infectives    Start     Dose/Rate Route Frequency Ordered Stop   10/02/16 2130  ceFAZolin (ANCEF) IVPB 2g/100 mL premix     2 g 200 mL/hr over 30 Minutes Intravenous Every 8 hours 10/02/16 1708 10/03/16 0613   10/02/16 1015  ceFAZolin (ANCEF) IVPB 2g/100 mL premix     2 g 200 mL/hr over 30 Minutes Intravenous On call to O.R. 10/02/16 1003 10/02/16 1328    She recovered in the PACU, then was transferred to [place] floor Rankin Bed 20. There she remained stable and was able to void  and ambulate in her room the evening of her surgery. I explained to her husband via phone that surgery was originally planned for  The right L5-S1 level and during the operation surgery was performed at the level above L4-5  And recognized and was then directed at the L5-S1 level, in effect she had surgery at 2 levels. He understood. POD#1 I explained the  Complication to this patient at her bedside that surgery was performed at the L4-5 and L5-S1 levels and that the surgery at the L4-5 level was not planned. She was aware of the complication. I do not expect any long term effect due to the additional level surgery as she had had previous surgery at this level one year ago. Her VS remained stable, She was standing and ambulation without difficulty. Strength in her leg was normal. Her pain in her leg was gone. Her dressing changed on POD#1 with scant drainage. Mild swelling was present. All questions were answered. She was discharged home on POD#1.   She was given sequential compression devices and early ambulation for DVT prophylaxis.   She benefited maximally from their hospital stay and there were no complications.    Recent vital signs:  Vitals:   10/02/16 2352 10/03/16 0325  BP: 116/66 110/69  Pulse: 72 70  Resp:    Temp: 98 F (36.7 C) 98.3 F (36.8  C)  SpO2: 99% 95%    Recent laboratory studies:  Results for orders placed or performed during the hospital encounter of 10/02/16  Glucose, capillary  Result Value Ref Range   Glucose-Capillary 99 65 - 99 mg/dL   Comment 1 Notify RN    Comment 2 Document in Chart   Comprehensive metabolic panel  Result Value Ref Range   Sodium 140 135 - 145 mmol/L   Potassium 3.4 (L) 3.5 - 5.1 mmol/L   Chloride 102 101 - 111 mmol/L   CO2 29 22 - 32 mmol/L   Glucose, Bld 103 (H) 65 - 99 mg/dL   BUN 7 6 - 20 mg/dL   Creatinine, Ser 0.73 0.44 - 1.00 mg/dL   Calcium 9.1 8.9 - 10.3 mg/dL   Total Protein 6.6 6.5 - 8.1 g/dL   Albumin 3.7 3.5 - 5.0  g/dL   AST 23 15 - 41 U/L   ALT 18 14 - 54 U/L   Alkaline Phosphatase 80 38 - 126 U/L   Total Bilirubin 0.5 0.3 - 1.2 mg/dL   GFR calc non Af Amer >60 >60 mL/min   GFR calc Af Amer >60 >60 mL/min   Anion gap 9 5 - 15  Protime-INR  Result Value Ref Range   Prothrombin Time 13.1 11.4 - 15.2 seconds   INR 1.00   APTT  Result Value Ref Range   aPTT 30 24 - 36 seconds  Glucose, capillary  Result Value Ref Range   Glucose-Capillary 98 65 - 99 mg/dL   Comment 1 Notify RN    Comment 2 Document in Chart   Glucose, capillary  Result Value Ref Range   Glucose-Capillary 129 (H) 65 - 99 mg/dL  Glucose, capillary  Result Value Ref Range   Glucose-Capillary 128 (H) 65 - 99 mg/dL  Glucose, capillary  Result Value Ref Range   Glucose-Capillary 161 (H) 65 - 99 mg/dL  Glucose, capillary  Result Value Ref Range   Glucose-Capillary 174 (H) 65 - 99 mg/dL   Comment 1 Document in Chart     Discharge Medications:   Allergies as of 10/03/2016      Reactions   Other Shortness Of Breath   UNSPECIFIED AGENTS Allergic to perfumes and cleaning products   Ace Inhibitors Cough      Aspirin Nausea Only      Medication List    STOP taking these medications   acetaminophen-codeine 300-30 MG tablet Commonly known as:  TYLENOL #3     TAKE these medications   ACCU-CHEK AVIVA PLUS w/Device Kit 1 each by Does not apply route 3 (three) times daily.   accu-chek soft touch lancets Use as instructed   albuterol (2.5 MG/3ML) 0.083% nebulizer solution Commonly known as:  PROVENTIL USE 1 VIAL VIA NEBULIZER EVERY 4 HOURS AS NEEDED FOR WHEEZING   allopurinol 100 MG tablet Commonly known as:  ZYLOPRIM Take 1 tablet (100 mg total) by mouth daily.   Colchicine 0.6 MG Caps Take 1 capsule by mouth daily.   EPINEPHrine 0.3 mg/0.3 mL Soaj injection Commonly known as:  EPI-PEN Inject 0.3 mLs (0.3 mg total) into the muscle once. What changed:  when to take this  reasons to take this   gabapentin  300 MG capsule Commonly known as:  NEURONTIN Take 1 capsule (300 mg total) by mouth 3 (three) times daily.   glucose blood test strip Commonly known as:  TRUE METRIX BLOOD GLUCOSE TEST USE AS DIRECTED BY PHYSICIAN   HYDROcodone-acetaminophen 5-325  MG tablet Commonly known as:  NORCO/VICODIN Take 1 tablet by mouth every 6 (six) hours as needed for moderate pain. What changed:  Another medication with the same name was added. Make sure you understand how and when to take each.   HYDROcodone-acetaminophen 7.5-325 MG tablet Commonly known as:  NORCO Take 1-2 tablets by mouth every 6 (six) hours as needed for moderate pain or severe pain. What changed:  You were already taking a medication with the same name, and this prescription was added. Make sure you understand how and when to take each.   hydrocortisone-pramoxine 2.5-1 % rectal cream Commonly known as:  ANALPRAM-HC Place 1 application rectally 2 (two) times daily. What changed:  when to take this  reasons to take this   hydrOXYzine 10 MG tablet Commonly known as:  ATARAX/VISTARIL TAKE 1 TABLET BY MOUTH THREE TIMES DAILY AS NEEDED FOR ITCHING What changed:  how much to take  how to take this  when to take this  reasons to take this  additional instructions   metFORMIN 750 MG 24 hr tablet Commonly known as:  GLUCOPHAGE-XR Take 1 tablet (750 mg total) by mouth daily with breakfast.   methocarbamol 500 MG tablet Commonly known as:  ROBAXIN Take 1 tablet (500 mg total) by mouth every 6 (six) hours as needed for muscle spasms.   mometasone-formoterol 100-5 MCG/ACT Aero Commonly known as:  DULERA Inhale 2 puffs into the lungs 2 (two) times daily.   multivitamin with minerals Tabs tablet Take 1 tablet by mouth daily.   nicotine 14 mg/24hr patch Commonly known as:  NICODERM CQ - dosed in mg/24 hours Place 1 patch (14 mg total) onto the skin daily.   pantoprazole 40 MG tablet Commonly known as:  PROTONIX Take 1  tablet (40 mg total) by mouth daily.   potassium chloride SA 20 MEQ tablet Commonly known as:  K-DUR,KLOR-CON Take 1 tablet (20 mEq total) by mouth daily.   simvastatin 20 MG tablet Commonly known as:  ZOCOR Take 1 tablet (20 mg total) by mouth at bedtime.   SYMBICORT 80-4.5 MCG/ACT inhaler Generic drug:  budesonide-formoterol INHALE TWO PUFFS BY MOUTH TWICE A DAY   valsartan-hydrochlorothiazide 160-12.5 MG tablet Commonly known as:  DIOVAN-HCT Take 1 tablet by mouth daily.            Discharge Care Instructions        Start     Ordered   10/03/16 0000  HYDROcodone-acetaminophen (NORCO) 7.5-325 MG tablet  Every 6 hours PRN     10/03/16 0906   10/03/16 0000  Call MD / Call 911    Comments:  If you experience chest pain or shortness of breath, CALL 911 and be transported to the hospital emergency room.  If you develope a fever above 101 F, pus (white drainage) or increased drainage or redness at the wound, or calf pain, call your surgeon's office.   10/03/16 0906   10/03/16 0000  Diet - low sodium heart healthy     10/03/16 0906   10/03/16 0000  Constipation Prevention    Comments:  Drink plenty of fluids.  Prune juice may be helpful.  You may use a stool softener, such as Colace (over the counter) 100 mg twice a day.  Use MiraLax (over the counter) for constipation as needed.   10/03/16 0906   10/03/16 0000  Increase activity slowly as tolerated     10/03/16 0906   10/03/16 0000  Driving restrictions  Comments:  No driving for 2 weeks   10/03/16 0906   10/03/16 0000  Lifting restrictions    Comments:  No lifting for 6 weeks   10/03/16 0906   10/03/16 0000  Discharge instructions    Comments:  No lifting greater than 10 lbs. Avoid bending, stooping and twisting. Walk in house for first week them may start to get out slowly increasing distance up to one mile by 3 weeks post op. Keep incision dry for 3 days, may use tegaderm or similar water impervious dressing. Ic  packs 30 minutes on and 30 minutes off as needed for pain and swelling. Rx given to patient to have filled before pharmacy closed.   10/03/16 3704      Diagnostic Studies: Dg Lumbar Spine 2-3 Views  Result Date: 10/02/2016 CLINICAL DATA:  Right L5-S1 decompression. EXAM: LUMBAR SPINE - 2-3 VIEW COMPARISON:  MRI lumbar spine dated August 31, 2016. FINDINGS: Multiple lateral intraoperative x-rays are submitted for interpretation. Surgical instrumentation is at the level of the L5 pedicle on the second and third images. Straightening of the normal lumbar spine. Moderate disc height loss at L4-L5. Osteopenia. IMPRESSION: Surgical instrumentation at the level of the L5 pedicle on the second and third images. Electronically Signed   By: Titus Dubin M.D.   On: 10/02/2016 15:21    Disposition: 01-Home or Self Care  Discharge Instructions    Call MD / Call 911    Complete by:  As directed    If you experience chest pain or shortness of breath, CALL 911 and be transported to the hospital emergency room.  If you develope a fever above 101 F, pus (white drainage) or increased drainage or redness at the wound, or calf pain, call your surgeon's office.   Constipation Prevention    Complete by:  As directed    Drink plenty of fluids.  Prune juice may be helpful.  You may use a stool softener, such as Colace (over the counter) 100 mg twice a day.  Use MiraLax (over the counter) for constipation as needed.   Diet - low sodium heart healthy    Complete by:  As directed    Discharge instructions    Complete by:  As directed    No lifting greater than 10 lbs. Avoid bending, stooping and twisting. Walk in house for first week them may start to get out slowly increasing distance up to one mile by 3 weeks post op. Keep incision dry for 3 days, may use tegaderm or similar water impervious dressing. Ic packs 30 minutes on and 30 minutes off as needed for pain and swelling. Rx given to patient to have filled  before pharmacy closed.   Driving restrictions    Complete by:  As directed    No driving for 2 weeks   Increase activity slowly as tolerated    Complete by:  As directed    Lifting restrictions    Complete by:  As directed    No lifting for 6 weeks      Follow-up Information    [name], MD In 2 weeks.   Specialty:  Orthopedic Surgery Why:  For wound re-check Contact information: [place] [phone]            Signed: Sharyne Richters 10/07/2016, 5:32 PM

## 2016-10-09 ENCOUNTER — Encounter: Payer: Self-pay | Admitting: Family Medicine

## 2016-10-09 ENCOUNTER — Telehealth: Payer: Self-pay | Admitting: *Deleted

## 2016-10-09 ENCOUNTER — Telehealth: Payer: Self-pay | Admitting: Family Medicine

## 2016-10-09 DIAGNOSIS — L661 Lichen planopilaris: Secondary | ICD-10-CM

## 2016-10-09 MED ORDER — FLUOCINONIDE 0.05 % EX SOLN: 1.0000 "application " | Freq: Two times a day (BID) | CUTANEOUS | 0 refills | Status: DC

## 2016-10-09 NOTE — Telephone Encounter (Signed)
Lidex refill declined by Medicaid since it was escribed by an Tax inspectorintern. I resend the medication and pharmacy confirmed they got it.

## 2016-10-09 NOTE — Telephone Encounter (Signed)
Received call from [facility] - Fluocinonide solution not covered by Medicaid.  Will print Medicaid formulary and discuss alternatives with Dr. Lum BabeEniola and call pharmacy back.  Altamese Dilling~Jaeleen Inzunza, BSN, RN-BC

## 2016-10-12 ENCOUNTER — Encounter: Payer: Self-pay | Admitting: Podiatry

## 2016-10-12 ENCOUNTER — Ambulatory Visit (INDEPENDENT_AMBULATORY_CARE_PROVIDER_SITE_OTHER): Payer: Medicaid Other | Admitting: Podiatry

## 2016-10-12 DIAGNOSIS — M79675 Pain in left toe(s): Secondary | ICD-10-CM

## 2016-10-12 DIAGNOSIS — M79676 Pain in unspecified toe(s): Secondary | ICD-10-CM

## 2016-10-12 DIAGNOSIS — B351 Tinea unguium: Secondary | ICD-10-CM

## 2016-10-12 DIAGNOSIS — M79674 Pain in right toe(s): Secondary | ICD-10-CM

## 2016-10-12 DIAGNOSIS — E1142 Type 2 diabetes mellitus with diabetic polyneuropathy: Secondary | ICD-10-CM

## 2016-10-12 NOTE — Patient Instructions (Addendum)
Apply Clotrimazole 1% cream in between the third and fourth toes and right foot 2 times daily 2 weeks or once daily 4 weeks  Diabetes and Foot Care Diabetes may cause you to have problems because of poor blood supply (circulation) to your feet and legs. This may cause the skin on your feet to become thinner, break easier, and heal more slowly. Your skin may become dry, and the skin may peel and crack. You may also have nerve damage in your legs and feet causing decreased feeling in them. You may not notice minor injuries to your feet that could lead to infections or more serious problems. Taking care of your feet is one of the most important things you can do for yourself. Follow these instructions at home:  Wear shoes at all times, even in the house. Do not go barefoot. Bare feet are easily injured.  Check your feet daily for blisters, cuts, and redness. If you cannot see the bottom of your feet, use a mirror or ask someone for help.  Wash your feet with warm water (do not use hot water) and mild soap. Then pat your feet and the areas between your toes until they are completely dry. Do not soak your feet as this can dry your skin.  Apply a moisturizing lotion or petroleum jelly (that does not contain alcohol and is unscented) to the skin on your feet and to dry, brittle toenails. Do not apply lotion between your toes.  Trim your toenails straight across. Do not dig under them or around the cuticle. File the edges of your nails with an emery board or nail file.  Do not cut corns or calluses or try to remove them with medicine.  Wear clean socks or stockings every day. Make sure they are not too tight. Do not wear knee-high stockings since they may decrease blood flow to your legs.  Wear shoes that fit properly and have enough cushioning. To break in new shoes, wear them for just a few hours a day. This prevents you from injuring your feet. Always look in your shoes before you put them on to  be sure there are no objects inside.  Do not cross your legs. This may decrease the blood flow to your feet.  If you find a minor scrape, cut, or break in the skin on your feet, keep it and the skin around it clean and dry. These areas may be cleansed with mild soap and water. Do not cleanse the area with peroxide, alcohol, or iodine.  When you remove an adhesive bandage, be sure not to damage the skin around it.  If you have a wound, look at it several times a day to make sure it is healing.  Do not use heating pads or hot water bottles. They may burn your skin. If you have lost feeling in your feet or legs, you may not know it is happening until it is too late.  Make sure your health care provider performs a complete foot exam at least annually or more often if you have foot problems. Report any cuts, sores, or bruises to your health care provider immediately. Contact a health care provider if:  You have an injury that is not healing.  You have cuts or breaks in the skin.  You have an ingrown nail.  You notice redness on your legs or feet.  You feel burning or tingling in your legs or feet.  You have pain or cramps in your  legs and feet.  Your legs or feet are numb.  Your feet always feel cold. Get help right away if:  There is increasing redness, swelling, or pain in or around a wound.  There is a red line that goes up your leg.  Pus is coming from a wound.  You develop a fever or as directed by your health care provider.  You notice a bad smell coming from an ulcer or wound. This information is not intended to replace advice given to you by your health care provider. Make sure you discuss any questions you have with your health care provider. Document Released: 01/17/2000 Document Revised: 06/27/2015 Document Reviewed: 06/28/2012 Elsevier Interactive Patient Education  2017 [employer].

## 2016-10-12 NOTE — Telephone Encounter (Signed)
Done at the hospital and is in the chart. jen

## 2016-10-12 NOTE — Progress Notes (Signed)
Patient ID: Tami LinPamela Ernest, female   DOB: 03-18-1960, 56 y.o.   MRN: [id number]   Subjective: This patient presents today for scheduled visit complaining of uncomfortable thickened toenails and requests toenail debridement.  Patient is diabetic since approximately 2011 and denies history of foot ulceration, claudication or amputation Patient is a current smoker  Objective: Orientated 3  Vascular: DP and PT pulses 2/4 bilaterally Capillary reflex immediate bilaterally No calf tenderness or peripheral edema bilaterally  Neurological: Sensation to 10 g monofilament wire intact 4/5 right and 3/5 left Vibratory sensation reactive bilaterally Ankle reflexes equal reactive bilaterally  Dermatological: No open skin lesions bilaterally Dry skin bilaterally The toenails are elongated, hypertrophic, deformed, discolored and tender direct palpation 6-10 Scaling third right webspace  Musculoskeletal: HAV bilaterally Hammertoe second bilaterally Manual motor testing dorsi flexion, plantar flexion, inversion, eversion 5/5 bilaterally Patient walks with the assistance of cane  Assessment: Satisfactory vascular status Diabetic peripheral neuropathy 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

## 2016-10-15 ENCOUNTER — Ambulatory Visit (INDEPENDENT_AMBULATORY_CARE_PROVIDER_SITE_OTHER): Payer: Medicaid Other | Admitting: Surgery

## 2016-10-15 ENCOUNTER — Encounter (INDEPENDENT_AMBULATORY_CARE_PROVIDER_SITE_OTHER): Payer: Self-pay | Admitting: Surgery

## 2016-10-15 DIAGNOSIS — Z9889 Other specified postprocedural states: Secondary | ICD-10-CM

## 2016-10-15 DIAGNOSIS — J441 Chronic obstructive pulmonary disease with (acute) exacerbation: Secondary | ICD-10-CM

## 2016-10-15 MED ORDER — HYDROCODONE-ACETAMINOPHEN 7.5-325 MG PO TABS: 1.0000 | ORAL_TABLET | Freq: Four times a day (QID) | ORAL | 0 refills | Status: DC | PRN

## 2016-10-15 MED ORDER — METHOCARBAMOL 500 MG PO TABS: 500.0000 mg | ORAL_TABLET | Freq: Four times a day (QID) | ORAL | 1 refills | Status: DC | PRN

## 2016-10-15 NOTE — Progress Notes (Signed)
Post-Op Visit Note   Patient: Stacy [name]           Date of Birth: October 22, 1960           MRN: [id number] Visit Date: 10/15/2016 PCP: Quentin AngstJegede, Olugbemiga E, MD   Assessment & Plan:  Chief Complaint: No chief complaint on file. Patient doing well at this point. States that preoperative leg pain numbness and tingling is gone. Has been working with physical therapy.    Visit Diagnoses:  1. Status post lumbar discectomy   2. COPD exacerbation (HCC)     Plan: Refilled pain medication and muscle relaxer. Follow-up in 4 weeks for recheck. Advised patient that she was still avoid bending, twisting, lifting, driving. Return sooner if needed.  Follow-Up Instructions: Return in about 4 weeks (around 11/12/2016) for dr Otelia Sergeantnitka.   Orders:  No orders of the defined types were placed in this encounter.  Meds ordered this encounter  Medications  . HYDROcodone-acetaminophen (NORCO) 7.5-325 MG tablet    Sig: Take 1-2 tablets by mouth every 6 (six) hours as needed for moderate pain or severe pain.    Dispense:  50 tablet    Refill:  0  . methocarbamol (ROBAXIN) 500 MG tablet    Sig: Take 1 tablet (500 mg total) by mouth every 6 (six) hours as needed for muscle spasms.    Dispense:  60 tablet    Refill:  1    Imaging: No results found.  PMFS History: Patient Active Problem List   Diagnosis Date Noted  . 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 Herniation of lumbar intervertebral disc with radiculopathy 10/02/2016    Class: Chronic  . Status post laminectomy 10/02/2016  . Alopecia areata 08/19/2016  . Gastroenteritis 03/11/2016  . postop Arthrofibrosis of knee joint, right 11/29/2015  . Pap smear for cervical cancer screening 10/24/2015  . Healthcare maintenance 10/24/2015  . Osteoarthritis of right knee 09/06/2015  . Type 2 diabetes mellitus without complication, without long-term current use of insulin (HCC) 06/13/2015  . Neurogenic claudication due to lumbar spinal stenosis 06/03/2015     Class: Chronic  . Chondromalacia of both patellae 06/03/2015    Class: Chronic  . Spinal stenosis, lumbar region, with neurogenic claudication 06/03/2015  . Tobacco use disorder 04/25/2015  . DJD (degenerative joint disease) of knee 01/04/2015  . Falls frequently 11/29/2014  . Hemorrhoid 11/14/2014  . Bilateral knee pain 07/31/2014  . Asthma, chronic 07/19/2014  . Gout of big toe 07/19/2014  . Midline low back pain with right-sided sciatica 01/01/2014  . Bilateral low back pain without sciatica 08/14/2013  . Essential hypertension 08/14/2013  . Gastroesophageal reflux disease without esophagitis 08/14/2013  . COPD exacerbation (HCC) 08/14/2013  . Colon cancer screening 08/14/2013  . Low back pain 05/15/2013  . Preventative health care 05/15/2013  . GERD (gastroesophageal reflux disease) 02/16/2013  . DM (diabetes mellitus) (HCC) 02/16/2013  . COPD (chronic obstructive pulmonary disease) (HCC) 04/17/2011  . Hypertension 04/17/2011  . Cough 04/16/2011   Past Medical History:  Diagnosis Date  . Arthritis   . 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 Asthma   . COPD (chronic obstructive pulmonary disease) (HCC)   . Diabetes mellitus    Type II  . GERD (gastroesophageal reflux disease)    Pt on Protonix daily  . Glaucoma   . Gout   . Headache(784.0)    otc meds prn  . Hyperlipidemia   . Hypertension    Pt on lisinopril  .  Irritable bowel syndrome 11/19/2010  . Neuropathy   . Pneumonia   . Restless legs   . Shortness of breath    occasional - uses breathing tx at home    Family History  Problem Relation Age of Onset  . Hypertension Father   . Cancer Father   . Heart disease Mother   . Asthma Son        had as a child  . Heart disease Sister   . Breast cancer Sister   . Hypertension Brother     Past Surgical History:  Procedure Laterality Date  . CHOLECYSTECTOMY    . COLONOSCOPY    . ENDOMETRIAL ABLATION  10/2010  . HERNIA REPAIR      umbicial hernia  . KNEE CLOSED REDUCTION Right 12/06/2015   Procedure: CLOSED MANIPULATION RIGHT KNEE;  Surgeon: Kerrin Champagne, MD;  Location: [facility] OR;  Service: Orthopedics;  Laterality: Right;  . KNEE CLOSED REDUCTION Right 01/17/2016   Procedure: CLOSED MANIPULATION RIGHT KNEE;  Surgeon: Kerrin Champagne, MD;  Location: [facility] OR;  Service: Orthopedics;  Laterality: Right;  . KNEE JOINT MANIPULATION Right 12/06/2015  . LUMBAR DISC SURGERY  06/03/2015   L 2  L3 L4 L5   . LUMBAR LAMINECTOMY/DECOMPRESSION MICRODISCECTOMY N/A 06/03/2015   Procedure: Bilateral lateral recess decompression L2-3, L3-4, L4-5;  Surgeon: Kerrin Champagne, MD;  Location: [facility] OR;  Service: Orthopedics;  Laterality: N/A;  . LUMBAR LAMINECTOMY/DECOMPRESSION MICRODISCECTOMY N/A 10/02/2016   Procedure: Right L5-S1 Lateral Recess Decompression  microdiscectomy;  Surgeon: Kerrin Champagne, MD;  Location: [facility] OR;  Service: Orthopedics;  Laterality: N/A;  . svd      x 2  . TOTAL KNEE ARTHROPLASTY Right 09/06/2015   Procedure: RIGHT TOTAL KNEE ARTHROPLASTY;  Surgeon: Kerrin Champagne, MD;  Location: [facility] OR;  Service: Orthopedics;  Laterality: Right;  . TUBAL LIGATION    . UPPER GASTROINTESTINAL ENDOSCOPY  04/28/11   Social History   Occupational History  . unemployed Unemployed   Social History Main Topics  . Smoking status: Current Some Day Smoker    Packs/day: 0.25    Years: 32.00    Types: Cigarettes  . Smokeless tobacco: Never Used  . Alcohol use No  . Drug use: No  . Sexual activity: Yes    Birth control/ protection: Surgical   Exam Pleasant female alert and oriented in no acute distress. Ablating well with a single-prong cane. Surgical incision is healing well. No drainage or signs of infection. New Steri-Strips applied. No focal motor deficits. Bilateral calves nontender

## 2016-11-18 ENCOUNTER — Ambulatory Visit: Payer: Medicaid Other | Admitting: Internal Medicine

## 2016-11-23 ENCOUNTER — Ambulatory Visit: Payer: Medicaid Other | Attending: Internal Medicine | Admitting: Internal Medicine

## 2016-11-23 ENCOUNTER — Encounter: Payer: Self-pay | Admitting: Internal Medicine

## 2016-11-23 VITALS — BP 127/72 | HR 94 | Temp 98.6°F | Resp 18 | Ht 67.0 in | Wt 169.0 lb

## 2016-11-23 DIAGNOSIS — J441 Chronic obstructive pulmonary disease with (acute) exacerbation: Secondary | ICD-10-CM | POA: Insufficient documentation

## 2016-11-23 DIAGNOSIS — K589 Irritable bowel syndrome without diarrhea: Secondary | ICD-10-CM | POA: Insufficient documentation

## 2016-11-23 DIAGNOSIS — Z9049 Acquired absence of other specified parts of digestive tract: Secondary | ICD-10-CM | POA: Insufficient documentation

## 2016-11-23 DIAGNOSIS — E119 Type 2 diabetes mellitus without complications: Secondary | ICD-10-CM | POA: Diagnosis not present

## 2016-11-23 DIAGNOSIS — M109 Gout, unspecified: Secondary | ICD-10-CM

## 2016-11-23 DIAGNOSIS — E785 Hyperlipidemia, unspecified: Secondary | ICD-10-CM | POA: Insufficient documentation

## 2016-11-23 DIAGNOSIS — E1140 Type 2 diabetes mellitus with diabetic neuropathy, unspecified: Secondary | ICD-10-CM | POA: Diagnosis not present

## 2016-11-23 DIAGNOSIS — K219 Gastro-esophageal reflux disease without esophagitis: Secondary | ICD-10-CM

## 2016-11-23 DIAGNOSIS — I10 Essential (primary) hypertension: Secondary | ICD-10-CM | POA: Insufficient documentation

## 2016-11-23 DIAGNOSIS — Z760 Encounter for issue of repeat prescription: Secondary | ICD-10-CM | POA: Diagnosis not present

## 2016-11-23 DIAGNOSIS — Z23 Encounter for immunization: Secondary | ICD-10-CM | POA: Diagnosis not present

## 2016-11-23 DIAGNOSIS — Z720 Tobacco use: Secondary | ICD-10-CM | POA: Insufficient documentation

## 2016-11-23 DIAGNOSIS — Z79899 Other long term (current) drug therapy: Secondary | ICD-10-CM | POA: Insufficient documentation

## 2016-11-23 DIAGNOSIS — R112 Nausea with vomiting, unspecified: Secondary | ICD-10-CM | POA: Diagnosis present

## 2016-11-23 DIAGNOSIS — H409 Unspecified glaucoma: Secondary | ICD-10-CM | POA: Insufficient documentation

## 2016-11-23 DIAGNOSIS — Z96651 Presence of right artificial knee joint: Secondary | ICD-10-CM | POA: Insufficient documentation

## 2016-11-23 DIAGNOSIS — F17200 Nicotine dependence, unspecified, uncomplicated: Secondary | ICD-10-CM

## 2016-11-23 DIAGNOSIS — Z7984 Long term (current) use of oral hypoglycemic drugs: Secondary | ICD-10-CM | POA: Diagnosis not present

## 2016-11-23 DIAGNOSIS — G2581 Restless legs syndrome: Secondary | ICD-10-CM | POA: Diagnosis not present

## 2016-11-23 LAB — GLUCOSE, POCT (MANUAL RESULT ENTRY): POC Glucose: 261 mg/dl — AB (ref 70–99)

## 2016-11-23 MED ORDER — ALLOPURINOL 100 MG PO TABS: 100.0000 mg | ORAL_TABLET | Freq: Every day | ORAL | 3 refills | Status: DC

## 2016-11-23 MED ORDER — METHOCARBAMOL 500 MG PO TABS: 500.0000 mg | ORAL_TABLET | Freq: Four times a day (QID) | ORAL | 3 refills | Status: DC | PRN

## 2016-11-23 MED ORDER — METFORMIN HCL ER 750 MG PO TB24: 750.0000 mg | ORAL_TABLET | Freq: Every day | ORAL | 3 refills | Status: DC

## 2016-11-23 MED ORDER — VALSARTAN-HYDROCHLOROTHIAZIDE 160-12.5 MG PO TABS: 1.0000 | ORAL_TABLET | Freq: Every day | ORAL | 3 refills | Status: DC

## 2016-11-23 MED ORDER — GABAPENTIN 300 MG PO CAPS: 300.0000 mg | ORAL_CAPSULE | Freq: Three times a day (TID) | ORAL | 3 refills | Status: DC

## 2016-11-23 MED ORDER — ONDANSETRON 4 MG PO TBDP: 4.0000 mg | ORAL_TABLET | Freq: Three times a day (TID) | ORAL | 0 refills | Status: DC | PRN

## 2016-11-23 MED ORDER — COLCHICINE 0.6 MG PO CAPS: 1.0000 | ORAL_CAPSULE | Freq: Every day | ORAL | 3 refills | Status: DC

## 2016-11-23 MED ORDER — GLUCOSE BLOOD VI STRP: ORAL_STRIP | 12 refills | Status: DC

## 2016-11-23 MED ORDER — PANTOPRAZOLE SODIUM 40 MG PO TBEC: 40.0000 mg | DELAYED_RELEASE_TABLET | Freq: Every day | ORAL | 3 refills | Status: DC

## 2016-11-23 MED ORDER — SIMVASTATIN 20 MG PO TABS: 20.0000 mg | ORAL_TABLET | Freq: Every day | ORAL | 3 refills | Status: DC

## 2016-11-23 MED ORDER — HYDROXYZINE HCL 10 MG PO TABS: 10.0000 mg | ORAL_TABLET | Freq: Three times a day (TID) | ORAL | 3 refills | Status: DC | PRN

## 2016-11-23 MED ORDER — ACCU-CHEK SOFT TOUCH LANCETS MISC: 12 refills | Status: DC

## 2016-11-23 MED ORDER — ACETAMINOPHEN-CODEINE #3 300-30 MG PO TABS: 1.0000 | ORAL_TABLET | ORAL | 0 refills | Status: DC | PRN

## 2016-11-23 MED ORDER — NICOTINE 14 MG/24HR TD PT24: 14.0000 mg | MEDICATED_PATCH | Freq: Every day | TRANSDERMAL | 3 refills | Status: DC

## 2016-11-23 NOTE — Patient Instructions (Signed)
Diabetes Mellitus and Exercise Exercising regularly is important for your overall health, especially when you have diabetes (diabetes mellitus). Exercising is not only about losing weight. It has many health benefits, such as increasing muscle strength and bone density and reducing body fat and stress. This leads to improved fitness, flexibility, and endurance, all of which result in better overall health. Exercise has additional benefits for people with diabetes, including:  Reducing appetite.  Helping to lower and control blood glucose.  Lowering blood pressure.  Helping to control amounts of fatty substances (lipids) in the blood, such as cholesterol and triglycerides.  Helping the body to respond better to insulin (improving insulin sensitivity).  Reducing how much insulin the body needs.  Decreasing the risk for heart disease by: ? Lowering cholesterol and triglyceride levels. ? Increasing the levels of good cholesterol. ? Lowering blood glucose levels.  What is my activity plan? Your health care provider or certified diabetes educator can help you make a plan for the type and frequency of exercise (activity plan) that works for you. Make sure that you:  Do at least 150 minutes of moderate-intensity or vigorous-intensity exercise each week. This could be brisk walking, biking, or water aerobics. ? Do stretching and strength exercises, such as yoga or weightlifting, at least 2 times a week. ? Spread out your activity over at least 3 days of the week.  Get some form of physical activity every day. ? Do not go more than 2 days in a row without some kind of physical activity. ? Avoid being inactive for more than 90 minutes at a time. Take frequent breaks to walk or stretch.  Choose a type of exercise or activity that you enjoy, and set realistic goals.  Start slowly, and gradually increase the intensity of your exercise over time.  What do I need to know about managing my  diabetes?  Check your blood glucose before and after exercising. ? If your blood glucose is higher than 240 mg/dL (13.3 mmol/L) before you exercise, check your urine for ketones. If you have ketones in your urine, do not exercise until your blood glucose returns to normal.  Know the symptoms of low blood glucose (hypoglycemia) and how to treat it. Your risk for hypoglycemia increases during and after exercise. Common symptoms of hypoglycemia can include: ? Hunger. ? Anxiety. ? Sweating and feeling clammy. ? Confusion. ? Dizziness or feeling light-headed. ? Increased heart rate or palpitations. ? Blurry vision. ? Tingling or numbness around the mouth, lips, or tongue. ? Tremors or shakes. ? Irritability.  Keep a rapid-acting carbohydrate snack available before, during, and after exercise to help prevent or treat hypoglycemia.  Avoid injecting insulin into areas of the body that are going to be exercised. For example, avoid injecting insulin into: ? The arms, when playing tennis. ? The legs, when jogging.  Keep records of your exercise habits. Doing this can help you and your health care provider adjust your diabetes management plan as needed. Write down: ? Food that you eat before and after you exercise. ? Blood glucose levels before and after you exercise. ? The type and amount of exercise you have done. ? When your insulin is expected to peak, if you use insulin. Avoid exercising at times when your insulin is peaking.  When you start a new exercise or activity, work with your health care provider to make sure the activity is safe for you, and to adjust your insulin, medicines, or food intake as needed.    Drink plenty of water while you exercise to prevent dehydration or heat stroke. Drink enough fluid to keep your urine clear or pale yellow. This information is not intended to replace advice given to you by your health care provider. Make sure you discuss any questions you have with  your health care provider. Document Released: 04/11/2003 Document Revised: 08/09/2015 Document Reviewed: 07/01/2015 Elsevier Interactive Patient Education  2018 [employer] Blood Glucose Monitoring, Adult Monitoring your blood sugar (glucose) helps you manage your diabetes. It also helps you and your health care provider determine how well your diabetes management plan is working. Blood glucose monitoring involves checking your blood glucose as often as directed, and keeping a record (log) of your results over time. Why should I monitor my blood glucose? Checking your blood glucose regularly can:  Help you understand how food, exercise, illnesses, and medicines affect your blood glucose.  Let you know what your blood glucose is at any time. You can quickly tell if you are having low blood glucose (hypoglycemia) or high blood glucose (hyperglycemia).  Help you and your health care provider adjust your medicines as needed.  When should I check my blood glucose? Follow instructions from your health care provider about how often to check your blood glucose. This may depend on:  The type of diabetes you have.  How well-controlled your diabetes is.  Medicines you are taking.  If you have type 1 diabetes:  Check your blood glucose at least 2 times a day.  Also check your blood glucose: ? Before every insulin injection. ? Before and after exercise. ? Between meals. ? 2 hours after a meal. ? Occasionally between 2:00 a.m. and 3:00 a.m., as directed. ? Before potentially dangerous tasks, like driving or using heavy machinery. ? At bedtime.  You may need to check your blood glucose more often, up to 6-10 times a day: ? If you use an insulin pump. ? If you need multiple daily injections (MDI). ? If your diabetes is not well-controlled. ? If you are ill. ? If you have a history of severe hypoglycemia. ? If you have a history of not knowing when your blood glucose is getting low  (hypoglycemia unawareness). If you have type 2 diabetes:  If you take insulin or other diabetes medicines, check your blood glucose at least 2 times a day.  If you are on intensive insulin therapy, check your blood glucose at least 4 times a day. Occasionally, you may also need to check between 2:00 a.m. and 3:00 a.m., as directed.  Also check your blood glucose: ? Before and after exercise. ? Before potentially dangerous tasks, like driving or using heavy machinery.  You may need to check your blood glucose more often if: ? Your medicine is being adjusted. ? Your diabetes is not well-controlled. ? You are ill. What is a blood glucose log?  A blood glucose log is a record of your blood glucose readings. It helps you and your health care provider: ? Look for patterns in your blood glucose over time. ? Adjust your diabetes management plan as needed.  Every time you check your blood glucose, write down your result and notes about things that may be affecting your blood glucose, such as your diet and exercise for the day.  Most glucose meters store a record of glucose readings in the meter. Some meters allow you to download your records to a computer. How do I check my blood glucose? Follow these steps to get accurate   readings of your blood glucose: Supplies needed   Blood glucose meter.  Test strips for your meter. Each meter has its own strips. You must use the strips that come with your meter.  A needle to prick your finger (lancet). Do not use lancets more than once.  A device that holds the lancet (lancing device).  A journal or log book to write down your results. Procedure  Wash your hands with soap and water.  Prick the side of your finger (not the tip) with the lancet. Use a different finger each time.  Gently rub the finger until a small drop of blood appears.  Follow instructions that come with your meter for inserting the test strip, applying blood to the strip,  and using your blood glucose meter.  Write down your result and any notes. Alternative testing sites  Some meters allow you to use areas of your body other than your finger (alternative sites) to test your blood.  If you think you may have hypoglycemia, or if you have hypoglycemia unawareness, do not use alternative sites. Use your finger instead.  Alternative sites may not be as accurate as the fingers, because blood flow is slower in these areas. This means that the result you get may be delayed, and it may be different from the result that you would get from your finger.  The most common alternative sites are: ? Forearm. ? Thigh. ? Palm of the hand. Additional tips  Always keep your supplies with you.  If you have questions or need help, all blood glucose meters have a 24-hour "hotline" number that you can call. You may also contact your health care provider.  After you use a few boxes of test strips, adjust (calibrate) your blood glucose meter by following instructions that came with your meter. This information is not intended to replace advice given to you by your health care provider. Make sure you discuss any questions you have with your health care provider. Document Released: 01/22/2003 Document Revised: 08/09/2015 Document Reviewed: 07/01/2015 Elsevier Interactive Patient Education  2017 [employer]  

## 2016-11-23 NOTE — Progress Notes (Signed)
Subjective:  Patient ID: Stacy [name], female    DOB: Jan 31, 1961  Age: 56 y.o. MRN: [id number]  CC: Diabetes  HPI Stacy [name] is a 56 year old female with a history of diabetes, gout, COPD, hypertension, and GERD that presents for follow-up and medication refill. Today, she complains of N/V/D that started a week ago and blames on IBS. She states that she was seen by GI and was prescribed sucralfate; however, she would like an antiemetic to improve her symptoms. Recently she had an opthalmology exam that required a change in glasses, but she was not able to afford them. She also has a foot exam scheduled this December. Reports having fasting blood sugar levels of 100. Denies fevers, chest pain, cough, numbness or tingling, and headches.  Past Medical History:  Diagnosis Date  . Arthritis   . Arthrofibrosis of total knee replacement ([place])    right  . Asthma   . COPD (chronic obstructive pulmonary disease) ([place])   . Diabetes mellitus    Type II  . GERD (gastroesophageal reflux disease)    Pt on Protonix daily  . Glaucoma   . Gout   . Headache(784.0)    otc meds prn  . Hyperlipidemia   . Hypertension    Pt on lisinopril  . Irritable bowel syndrome 11/19/2010  . Neuropathy   . Pneumonia   . Restless legs   . Shortness of breath    occasional - uses breathing tx at home     Past Surgical History:  Procedure Laterality Date  . CHOLECYSTECTOMY    . COLONOSCOPY    . ENDOMETRIAL ABLATION  10/2010  . HERNIA REPAIR     umbicial hernia  . KNEE CLOSED REDUCTION Right 12/06/2015   Procedure: CLOSED MANIPULATION RIGHT KNEE;  Surgeon: [name], MD;  Location: [place];  Service: Orthopedics;  Laterality: Right;  . KNEE CLOSED REDUCTION Right 01/17/2016   Procedure: CLOSED MANIPULATION RIGHT KNEE;  Surgeon: [name], MD;  Location: [place];  Service: Orthopedics;  Laterality: Right;  . KNEE JOINT MANIPULATION Right 12/06/2015  . LUMBAR DISC SURGERY  06/03/2015   L 2  L3 L4 L5     . LUMBAR LAMINECTOMY/DECOMPRESSION MICRODISCECTOMY N/A 06/03/2015   Procedure: Bilateral lateral recess decompression L2-3, L3-4, L4-5;  Surgeon: [name], MD;  Location: [place];  Service: Orthopedics;  Laterality: N/A;  . LUMBAR LAMINECTOMY/DECOMPRESSION MICRODISCECTOMY N/A 10/02/2016   Procedure: Right L5-S1 Lateral Recess Decompression  microdiscectomy;  Surgeon: [name], MD;  Location: [place];  Service: Orthopedics;  Laterality: N/A;  . svd      x 2  . TOTAL KNEE ARTHROPLASTY Right 09/06/2015   Procedure: RIGHT TOTAL KNEE ARTHROPLASTY;  Surgeon: [name], MD;  Location: [place];  Service: Orthopedics;  Laterality: Right;  . TUBAL LIGATION    . UPPER GASTROINTESTINAL ENDOSCOPY  04/28/11     Outpatient Medications Prior to Visit  Medication Sig Dispense Refill  . albuterol (PROVENTIL) (2.5 MG/3ML) 0.083% nebulizer solution USE 1 VIAL VIA NEBULIZER EVERY 4 HOURS AS NEEDED FOR WHEEZING 1650 mL 0  . Blood Glucose Monitoring Suppl (ACCU-CHEK AVIVA PLUS) w/Device KIT 1 each by Does not apply route 3 (three) times daily. 1 kit 0  . EPINEPHrine 0.3 mg/0.3 mL IJ SOAJ injection Inject 0.3 mLs (0.3 mg total) into the muscle once. (Patient taking differently: Inject 0.3 mg into the muscle daily as needed (for allergic reaction). ) 1 Device 1  . fluocinonide (LIDEX) 0.05 %  external solution Apply 1 application topically 2 (two) times daily. 60 mL 0  . hydrocortisone-pramoxine (ANALPRAM-HC) 2.5-1 % rectal cream Place 1 application rectally 2 (two) times daily. (Patient taking differently: Place 1 application rectally 2 (two) times daily as needed for hemorrhoids. ) 30 g 0  . mometasone-formoterol (DULERA) 100-5 MCG/ACT AERO Inhale 2 puffs into the lungs 2 (two) times daily. 1 Inhaler 3  . Multiple Vitamin (MULTIVITAMIN WITH MINERALS) TABS tablet Take 1 tablet by mouth daily.    . SYMBICORT 80-4.5 MCG/ACT inhaler INHALE TWO PUFFS BY MOUTH TWICE A DAY 30.6 g 0  . allopurinol (ZYLOPRIM) 100 MG  tablet Take 1 tablet (100 mg total) by mouth daily. 90 tablet 3  . Colchicine 0.6 MG CAPS Take 1 capsule by mouth daily. 90 capsule 3  . gabapentin (NEURONTIN) 300 MG capsule Take 1 capsule (300 mg total) by mouth 3 (three) times daily. 270 capsule 3  . glucose blood (TRUE METRIX BLOOD GLUCOSE TEST) test strip USE AS DIRECTED BY PHYSICIAN 100 each 12  . HYDROcodone-acetaminophen (NORCO) 7.5-325 MG tablet Take 1-2 tablets by mouth every 6 (six) hours as needed for moderate pain or severe pain. 50 tablet 0  . HYDROcodone-acetaminophen (NORCO/VICODIN) 5-325 MG tablet Take 1 tablet by mouth every 6 (six) hours as needed for moderate pain. (Patient taking differently: Take 1 tablet by mouth every 6 (six) hours as needed for moderate pain. ) 40 tablet 0  . hydrOXYzine (ATARAX/VISTARIL) 10 MG tablet TAKE 1 TABLET BY MOUTH THREE TIMES DAILY AS NEEDED FOR ITCHING (Patient taking differently: Take 10 mg by mouth 3 (three) times daily as needed for itching. TAKE 1 TABLET BY MOUTH THREE TIMES DAILY AS NEEDED FOR ITCHING) 30 tablet 2  . Lancets (ACCU-CHEK SOFT TOUCH) lancets Use as instructed 100 each 12  . metFORMIN (GLUCOPHAGE-XR) 750 MG 24 hr tablet Take 1 tablet (750 mg total) by mouth daily with breakfast. 90 tablet 3  . methocarbamol (ROBAXIN) 500 MG tablet Take 1 tablet (500 mg total) by mouth every 6 (six) hours as needed for muscle spasms. 60 tablet 1  . pantoprazole (PROTONIX) 40 MG tablet Take 1 tablet (40 mg total) by mouth daily. 90 tablet 3  . simvastatin (ZOCOR) 20 MG tablet Take 1 tablet (20 mg total) by mouth at bedtime. 90 tablet 3  . valsartan-hydrochlorothiazide (DIOVAN-HCT) 160-12.5 MG tablet Take 1 tablet by mouth daily. 90 tablet 3  . potassium chloride SA (K-DUR,KLOR-CON) 20 MEQ tablet Take 1 tablet (20 mEq total) by mouth daily. (Patient not taking: Reported on 09/29/2016) 30 tablet 3  . nicotine (NICODERM CQ - DOSED IN MG/24 HOURS) 14 mg/24hr patch Place 1 patch (14 mg total) onto the skin  daily. (Patient not taking: Reported on 09/29/2016) 28 patch 3   No facility-administered medications prior to visit.     ROS Review of Systems  Constitutional: Negative for activity change, appetite change, chills and fatigue.  HENT: Negative.   Respiratory: Negative for cough, chest tightness and shortness of breath.   Cardiovascular: Negative for chest pain, palpitations and leg swelling.  Gastrointestinal: Positive for diarrhea, nausea and vomiting. Negative for abdominal distention and abdominal pain.  Musculoskeletal: Negative for myalgias and neck pain.  Neurological: Negative for dizziness, numbness and headaches.  Psychiatric/Behavioral: Negative for sleep disturbance. The patient is not nervous/anxious.     Objective:  BP 127/72 (BP Location: Left Arm, Patient Position: Sitting, Cuff Size: Normal)   Pulse 94   Temp 98.6 F (37 C) (Oral)  Resp 18   Ht 5' 7"  (1.702 m)   Wt 169 lb (76.7 kg)   SpO2 95%   BMI 26.47 kg/m   BP/Weight 11/23/2016 03/09/8525 08/09/2421  Systolic BP 536 144 315  Diastolic BP 72 58 69  Wt. (Lbs) 169 164.2 -  BMI 26.47 29.09 -    Physical Exam  Constitutional: She is oriented to person, place, and time. She appears well-developed and well-nourished. No distress.  HENT:  Mouth/Throat: Oropharynx is clear and moist. No oropharyngeal exudate.  Eyes: Pupils are equal, round, and reactive to light.  Neck: Normal range of motion. Neck supple.  Cardiovascular: Normal rate, regular rhythm and normal heart sounds.   Pulmonary/Chest: Effort normal and breath sounds normal. No respiratory distress. She has no wheezes.  Abdominal: Soft. Bowel sounds are normal. There is no tenderness.  Musculoskeletal: Normal range of motion.  Neurological: She is alert and oriented to person, place, and time.  Skin: Skin is warm and dry.  Psychiatric: She has a normal mood and affect.    Lab Results  Component Value Date   HGBA1C 6.4 (H) 10/01/2016    Assessment  & Plan:   1. Type 2 diabetes mellitus without complication, without long-term current use of insulin (HCC) Controlled  - Glucose (CBG) - metFORMIN (GLUCOPHAGE-XR) 750 MG 24 hr tablet; Take 1 tablet (750 mg total) by mouth daily with breakfast.  Dispense: 90 tablet; Refill: 3 - simvastatin (ZOCOR) 20 MG tablet; Take 1 tablet (20 mg total) by mouth at bedtime.  Dispense: 90 tablet; Refill: 3 - gabapentin (NEURONTIN) 300 MG capsule; Take 1 capsule (300 mg total) by mouth 3 (three) times daily.  Dispense: 270 capsule; Refill: 3 - Lancets (ACCU-CHEK SOFT TOUCH) lancets; Use as instructed  Dispense: 100 each; Refill: 12 - hydrOXYzine (ATARAX/VISTARIL) 10 MG tablet; Take 1 tablet (10 mg total) by mouth 3 (three) times daily as needed for itching.  Dispense: 90 tablet; Refill: 3 - glucose blood (TRUE METRIX BLOOD GLUCOSE TEST) test strip; USE AS DIRECTED BY PHYSICIAN  Dispense: 100 each; Refill: 12 - Flu Vaccine QUAD 6+ mos PF IM (Fluarix Quad PF)  2. Essential hypertension We have discussed target BP range and blood pressure goal. I have advised patient to check BP regularly and to call us back or report to clinic if the numbers are consistently higher than 140/90. We discussed the importance of compliance with medical therapy and DASH diet recommended, consequences of uncontrolled hypertension discussed.  - continue current BP medications - valsartan-hydrochlorothiazide (DIOVAN-HCT) 160-12.5 MG tablet; Take 1 tablet by mouth daily.  Dispense: 90 tablet; Refill: 3 - CMP14+EGFR  3. Gastroesophageal reflux disease without esophagitis - pantoprazole (PROTONIX) 40 MG tablet; Take 1 tablet (40 mg total) by mouth daily.  Dispense: 90 tablet; Refill: 3 - ondansetron (ZOFRAN ODT) 4 MG disintegrating tablet; Take 1 tablet (4 mg total) by mouth every 8 (eight) hours as needed for nausea or vomiting.  Dispense: 20 tablet; Refill: 0  4. COPD exacerbation (HCC) - methocarbamol (ROBAXIN) 500 MG tablet; Take 1  tablet (500 mg total) by mouth every 6 (six) hours as needed for muscle spasms.  Dispense: 60 tablet; Refill: 3  5. Gout of big toe - allopurinol (ZYLOPRIM) 100 MG tablet; Take 1 tablet (100 mg total) by mouth daily.  Dispense: 90 tablet; Refill: 3 - Colchicine 0.6 MG CAPS; Take 1 capsule by mouth daily.  Dispense: 90 capsule; Refill: 3  6. Tobacco use disorder - nicotine (NICODERM CQ - DOSED IN  MG/24 HOURS) 14 mg/24hr patch; Place 1 patch (14 mg total) onto the skin daily.  Dispense: 28 patch; Refill: 3   Meds ordered this encounter  Medications  . metFORMIN (GLUCOPHAGE-XR) 750 MG 24 hr tablet    Sig: Take 1 tablet (750 mg total) by mouth daily with breakfast.    Dispense:  90 tablet    Refill:  3  . simvastatin (ZOCOR) 20 MG tablet    Sig: Take 1 tablet (20 mg total) by mouth at bedtime.    Dispense:  90 tablet    Refill:  3  . valsartan-hydrochlorothiazide (DIOVAN-HCT) 160-12.5 MG tablet    Sig: Take 1 tablet by mouth daily.    Dispense:  90 tablet    Refill:  3  . pantoprazole (PROTONIX) 40 MG tablet    Sig: Take 1 tablet (40 mg total) by mouth daily.    Dispense:  90 tablet    Refill:  3  . methocarbamol (ROBAXIN) 500 MG tablet    Sig: Take 1 tablet (500 mg total) by mouth every 6 (six) hours as needed for muscle spasms.    Dispense:  60 tablet    Refill:  3  . allopurinol (ZYLOPRIM) 100 MG tablet    Sig: Take 1 tablet (100 mg total) by mouth daily.    Dispense:  90 tablet    Refill:  3  . Colchicine 0.6 MG CAPS    Sig: Take 1 capsule by mouth daily.    Dispense:  90 capsule    Refill:  3  . gabapentin (NEURONTIN) 300 MG capsule    Sig: Take 1 capsule (300 mg total) by mouth 3 (three) times daily.    Dispense:  270 capsule    Refill:  3  . Lancets (ACCU-CHEK SOFT TOUCH) lancets    Sig: Use as instructed    Dispense:  100 each    Refill:  12  . hydrOXYzine (ATARAX/VISTARIL) 10 MG tablet    Sig: Take 1 tablet (10 mg total) by mouth 3 (three) times daily as needed  for itching.    Dispense:  90 tablet    Refill:  3  . glucose blood (TRUE METRIX BLOOD GLUCOSE TEST) test strip    Sig: USE AS DIRECTED BY PHYSICIAN    Dispense:  100 each    Refill:  12  . nicotine (NICODERM CQ - DOSED IN MG/24 HOURS) 14 mg/24hr patch    Sig: Place 1 patch (14 mg total) onto the skin daily.    Dispense:  28 patch    Refill:  3  . acetaminophen-codeine (TYLENOL #3) 300-30 MG tablet    Sig: Take 1 tablet by mouth every 4 (four) hours as needed.    Dispense:  90 tablet    Refill:  0  . ondansetron (ZOFRAN ODT) 4 MG disintegrating tablet    Sig: Take 1 tablet (4 mg total) by mouth every 8 (eight) hours as needed for nausea or vomiting.    Dispense:  20 tablet    Refill:  0    Follow-up: Return in about 3 months (around 02/23/2017) for Hemoglobin A1C and Follow up, DM, Follow up HTN.    Evaluation and management procedures were performed by me with DNP Student in attendance, note written by DNP student under my supervision and collaboration. I have reviewed the note and I agree with the management and plan.   Angelica Chessman, MD, MHA, CPE, [place], [place] and [place],  [place] [phone]   12/01/2016, 5:40 PM

## 2016-11-24 LAB — CMP14+EGFR
ALT: 6 IU/L (ref 0–32)
AST: 15 IU/L (ref 0–40)
Albumin/Globulin Ratio: 1.5 (ref 1.2–2.2)
Albumin: 4.7 g/dL (ref 3.5–5.5)
Alkaline Phosphatase: 115 IU/L (ref 39–117)
BUN/Creatinine Ratio: 13 (ref 9–23)
BUN: 11 mg/dL (ref 6–24)
Bilirubin Total: 0.3 mg/dL (ref 0.0–1.2)
CO2: 26 mmol/L (ref 20–29)
Calcium: 10.1 mg/dL (ref 8.7–10.2)
Chloride: 95 mmol/L — ABNORMAL LOW (ref 96–106)
Creatinine, Ser: 0.87 mg/dL (ref 0.57–1.00)
GFR calc Af Amer: 86 mL/min/{1.73_m2} (ref >59)
GFR calc non Af Amer: 74 mL/min/{1.73_m2} (ref >59)
Globulin, Total: 3.1 g/dL (ref 1.5–4.5)
Glucose: 100 mg/dL — ABNORMAL HIGH (ref 65–99)
Potassium: 3.4 mmol/L — ABNORMAL LOW (ref 3.5–5.2)
Sodium: 140 mmol/L (ref 134–144)
Total Protein: 7.8 g/dL (ref 6.0–8.5)

## 2016-11-26 ENCOUNTER — Ambulatory Visit (INDEPENDENT_AMBULATORY_CARE_PROVIDER_SITE_OTHER): Payer: Medicaid Other | Admitting: Specialist

## 2016-11-26 ENCOUNTER — Ambulatory Visit (INDEPENDENT_AMBULATORY_CARE_PROVIDER_SITE_OTHER): Payer: Medicaid Other | Admitting: Family Medicine

## 2016-11-26 ENCOUNTER — Encounter (INDEPENDENT_AMBULATORY_CARE_PROVIDER_SITE_OTHER): Payer: Self-pay | Admitting: Specialist

## 2016-11-26 VITALS — BP 116/70 | HR 85 | Ht 66.0 in | Wt 169.0 lb

## 2016-11-26 VITALS — BP 118/76 | HR 96 | Temp 98.1°F | Wt 170.0 lb

## 2016-11-26 DIAGNOSIS — L661 Lichen planopilaris: Secondary | ICD-10-CM | POA: Diagnosis not present

## 2016-11-26 DIAGNOSIS — Z96651 Presence of right artificial knee joint: Secondary | ICD-10-CM

## 2016-11-26 DIAGNOSIS — Z9889 Other specified postprocedural states: Secondary | ICD-10-CM

## 2016-11-26 MED ORDER — ACETAMINOPHEN-CODEINE #3 300-30 MG PO TABS: 1.0000 | ORAL_TABLET | ORAL | 0 refills | Status: DC | PRN

## 2016-11-26 MED ORDER — DICLOFENAC SODIUM 1 % TD GEL
4.0000 g | Freq: Four times a day (QID) | TRANSDERMAL | 1 refills | Status: DC
Start: 2016-11-26 — End: 2017-08-03

## 2016-11-26 NOTE — Assessment & Plan Note (Signed)
  Area has improved in appearance and in terms of pain with Lidex treatment. Please see pictures from Dr. Phebe CollaEniola's note from today.  Punch biopsy preformed today in clinic, tolerated well Specimen sent to path Continue lidex solution BID Follow up 2 months w/ Dr. Lum BabeEniola Patient verbalized understanding and agreement with plan.

## 2016-11-26 NOTE — Patient Instructions (Signed)
Plan: Avoid frequent bending and stooping  No lifting greater than 10 lbs. May use ice or moist heat for pain. Weight loss is of benefit. Handicap license is approved. Avoid frequent bending and stooping  May use ice or moist heat for pain. Weight loss is of benefit.

## 2016-11-26 NOTE — Addendum Note (Signed)
Addended by: Vira BrownsNITKA, Nakshatra Klose on: 11/26/2016 11:26 AM   Modules accepted: Orders

## 2016-11-26 NOTE — Progress Notes (Signed)
   BEFORE PICTURE         AFTER PICTURE   Patient is here for follow-up. She has been using topical steroid on scalp with some improvement. Here for biopsy and intra-lesion steroid injection. Other option discussed which include monitor on steroid. Informed written and verbal consent obtained. Procedure performed by Dr. Wonda Oldsiccio under my supervision. See resident's note below. I agree with her documentation. F/U in 1-2 months for reassessment.

## 2016-11-26 NOTE — Progress Notes (Signed)
    Subjective:    Patient ID: Stacy [name], female    DOB: [birth year], 56 y.o.   MRN: [id number]   CC: follow up scalp lesion  Has been using topical fluocinonide solution twice a day with a big improvement in tenderness. She is unable to see the area herself to say if it looks improved. She is here today for biopsy of the area to figure out what this is. She would also like steroid injection into the lesion as well for treatment.   Smoking status reviewed- current smoker  Review of Systems- Denies further lesions, itching, new rash. No fevers, chills, blood in urine or stool, bruising.   Objective:  BP 118/76   Pulse 96   Temp 98.1 F (36.7 C) (Oral)   Wt 170 lb (77.1 kg)   SpO2 99%   BMI 27.44 kg/m  Vitals and nursing note reviewed  General: well nourished, in no acute distress HEENT: normocephalic, no scleral icterus or conjunctival pallor, no nasal discharge, moist mucous membranes Scalp: circular area of hair loss with hypopigmented skin and flaky appearance Extremities: no edema or cyanosis. Skin: warm and dry, no rashes noted Neuro: alert and oriented, no focal deficits  Punch Biopsy Procedure Note  Pre-operative Diagnosis: lichen planopilaris  Post-operative Diagnosis: same  Locations:right parietal scalp  Indications: hair loss  Anesthesia: Lidocaine 2% with epinephrine    Procedure Details  History of allergy to iodine: no Patient informed of the risks (including bleeding and infection) and benefits of the  procedure and Written informed consent obtained.  The lesion and surrounding area was given a sterile prep using betadyne and draped in the usual sterile fashion. The skin was then stretched perpendicular to the skin tension lines and the lesion removed using the 3 mm punch. Silver nitrate was then applied to the resulting lesion. Hemostasis was achieved. The specimen was sent for pathologic examination. The patient tolerated the procedure  well.  EBL: 0 ml  Condition: Stable  Complications: none.  Plan: 1. Instructed to keep the wound dry and covered for 24-48h and clean thereafter. 2. Warning signs of infection were reviewed.   3. Recommended that the patient use OTC acetaminophen as needed for pain.  4. Continue current treatment, follow up 2 months  Assessment & Plan:    Lichen planopilaris  Area has improved in appearance and in terms of pain with Lidex treatment. Please see pictures from Dr. Phebe CollaEniola's note from today.  Punch biopsy preformed today in clinic, tolerated well Specimen sent to path Continue lidex solution BID Follow up 2 months w/ Dr. Lum BabeEniola Patient verbalized understanding and agreement with plan.     Return in about 2 months (around 01/26/2017), or as needed.   Dolores PattyAngela Riccio, DO Family Medicine Resident PGY-2

## 2016-11-26 NOTE — Progress Notes (Signed)
Post-Op Visit Note   Patient: Stacy [name]           Date of Birth: [birth year]           MRN: [id number] Visit Date: 11/26/2016 PCP: Quentin Angst, MD   Assessment & Plan: 2 months post L5-S1 microdiscectomy, experiencing some discomfort in the right knee.   Chief Complaint:  Chief Complaint  Patient presents with  . Lower Back - Routine Post Op  Right knee ROM -3 to 110 degrees. Some complaints of right knee anterolateral patella tendon and parapatella tenderness and scar swelling.  SLR is negative, Motor is normal both sides.  PT at [facility].   Visit Diagnoses:  1. S/P lumbar laminectomy   2. Status post right knee replacement     Plan: Avoid frequent bending and stooping  No lifting greater than 10 lbs. May use ice or moist heat for pain. Weight loss is of benefit. Handicap license is approved. Avoid frequent bending and stooping  May use ice or moist heat for pain. Weight loss is of benefit.      Follow-Up Instructions: Return in about 4 weeks (around 12/24/2016).   Orders:  No orders of the defined types were placed in this encounter.  Meds ordered this encounter  Medications  . diclofenac sodium (VOLTAREN) 1 % GEL    Sig: Apply 4 g topically 4 (four) times daily.    Dispense:  3 Tube    Refill:  1  . acetaminophen-codeine (TYLENOL #3) 300-30 MG tablet    Sig: Take 1 tablet by mouth every 4 (four) hours as needed.    Dispense:  40 tablet    Refill:  0    Imaging: No results found.  PMFS History: Patient Active Problem List   Diagnosis Date Noted  . Herniation of lumbar intervertebral disc with radiculopathy 10/02/2016    Priority: High    Class: Chronic  . Neurogenic claudication due to lumbar spinal stenosis 06/03/2015    Priority: High    Class: Chronic  . Chondromalacia of both patellae 06/03/2015    Priority: High    Class: Chronic  . 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 Status post laminectomy 10/02/2016  . Alopecia areata  08/19/2016  . Gastroenteritis 03/11/2016  . postop Arthrofibrosis of knee joint, right 11/29/2015  . Pap smear for cervical cancer screening 10/24/2015  . Healthcare maintenance 10/24/2015  . Osteoarthritis of right knee 09/06/2015  . Type 2 diabetes mellitus without complication, without long-term current use of insulin (HCC) 06/13/2015  . Spinal stenosis, lumbar region, with neurogenic claudication 06/03/2015  . Tobacco use disorder 04/25/2015  . DJD (degenerative joint disease) of knee 01/04/2015  . Falls frequently 11/29/2014  . Hemorrhoid 11/14/2014  . Bilateral knee pain 07/31/2014  . Asthma, chronic 07/19/2014  . Gout of big toe 07/19/2014  . Midline low back pain with right-sided sciatica 01/01/2014  . Bilateral low back pain without sciatica 08/14/2013  . Essential hypertension 08/14/2013  . Gastroesophageal reflux disease without esophagitis 08/14/2013  . COPD exacerbation (HCC) 08/14/2013  . Colon cancer screening 08/14/2013  . Low back pain 05/15/2013  . Preventative health care 05/15/2013  . GERD (gastroesophageal reflux disease) 02/16/2013  . DM (diabetes mellitus) (HCC) 02/16/2013  . COPD (chronic obstructive pulmonary disease) (HCC) 04/17/2011  . Hypertension 04/17/2011  . Cough 04/16/2011   Past Medical History:  Diagnosis Date  . Arthritis   . 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 Asthma   .  COPD (chronic obstructive pulmonary disease) (HCC)   . Diabetes mellitus    Type II  . GERD (gastroesophageal reflux disease)    Pt on Protonix daily  . Glaucoma   . Gout   . Headache(784.0)    otc meds prn  . Hyperlipidemia   . Hypertension    Pt on lisinopril  . Irritable bowel syndrome 11/19/2010  . Neuropathy   . Pneumonia   . Restless legs   . Shortness of breath    occasional - uses breathing tx at home    Family History  Problem Relation Age of Onset  . Hypertension Father   . Cancer Father   . Heart disease Mother   . Asthma  Son        had as a child  . Heart disease Sister   . Breast cancer Sister   . Hypertension Brother     Past Surgical History:  Procedure Laterality Date  . CHOLECYSTECTOMY    . COLONOSCOPY    . ENDOMETRIAL ABLATION  10/2010  . HERNIA REPAIR     umbicial hernia  . KNEE CLOSED REDUCTION Right 12/06/2015   Procedure: CLOSED MANIPULATION RIGHT KNEE;  Surgeon: Kerrin ChampagneJames E Nitka, MD;  Location: [facility];  Service: Orthopedics;  Laterality: Right;  . KNEE CLOSED REDUCTION Right 01/17/2016   Procedure: CLOSED MANIPULATION RIGHT KNEE;  Surgeon: Kerrin ChampagneJames E Nitka, MD;  Location: [facility];  Service: Orthopedics;  Laterality: Right;  . KNEE JOINT MANIPULATION Right 12/06/2015  . LUMBAR DISC SURGERY  06/03/2015   L 2  L3 L4 L5   . LUMBAR LAMINECTOMY/DECOMPRESSION MICRODISCECTOMY N/A 06/03/2015   Procedure: Bilateral lateral recess decompression L2-3, L3-4, L4-5;  Surgeon: Kerrin ChampagneJames E Nitka, MD;  Location: [facility];  Service: Orthopedics;  Laterality: N/A;  . LUMBAR LAMINECTOMY/DECOMPRESSION MICRODISCECTOMY N/A 10/02/2016   Procedure: Right L5-S1 Lateral Recess Decompression  microdiscectomy;  Surgeon: Kerrin ChampagneNitka, James E, MD;  [facility];  Service: Orthopedics;  Laterality: N/A;  . svd      x 2  . TOTAL KNEE ARTHROPLASTY Right 09/06/2015   Procedure: RIGHT TOTAL KNEE ARTHROPLASTY;  Surgeon: Kerrin ChampagneJames E Nitka, MD;  Location: [facility];  Service: Orthopedics;  Laterality: Right;  . TUBAL LIGATION    . UPPER GASTROINTESTINAL ENDOSCOPY  04/28/11   Social History   Occupational History  . unemployed Unemployed   Social History Main Topics  . Smoking status: Current Some Day Smoker    Packs/day: 0.25    Years: 32.00    Types: Cigarettes  . Smokeless tobacco: Never Used  . Alcohol use No  . Drug use: No  . Sexual activity: Yes    Birth control/ protection: Surgical

## 2016-11-26 NOTE — Patient Instructions (Signed)
   It was nice to meet you today!  We will call you with the pathology report in the next 1-2 weeks.  If you have questions or concerns please do not hesitate to call at [phone].  Dolores PattyAngela Jaziah Goeller, DO PGY-2, [facility] 11/26/2016 4:26 PM    Skin Biopsy, Care After Refer to this sheet in the next few weeks. These instructions provide you with information about caring for yourself after your procedure. Your health care provider may also give you more specific instructions. Your treatment has been planned according to current medical practices, but problems sometimes occur. Call your health care provider if you have any problems or questions after your procedure. What can I expect after the procedure? After the procedure, it is common to have:  Soreness.  Bruising.  Itching.  Follow these instructions at home:  Rest and then return to your normal activities as told by your health care provider.  Take over-the-counter and prescription medicines only as told by your health care provider.  Follow instructions from your health care provider about how to take care of your biopsy site.Make sure you: ? Wash your hands with soap and water before you change your bandage (dressing). If soap and water are not available, use hand sanitizer. ? Change your dressing as told by your health care provider. ? Leave stitches (sutures), skin glue, or adhesive strips in place. These skin closures may need to stay in place for 2 weeks or longer. If adhesive strip edges start to loosen and curl up, you may trim the loose edges. Do not remove adhesive strips completely unless your health care provider tells you to do that. If the biopsy area bleeds, apply gentle pressure for 10 minutes.  Check your biopsy site every day for signs of infection. Check for: ? More redness, swelling, or pain. ? More fluid or blood. ? Warmth. ? Pus or a bad smell.  Keep all follow-up visits as told by  your health care provider. This is important. Contact a health care provider if:  You have more redness, swelling, or pain around your biopsy site.  You have more fluid or blood coming from your biopsy site.  Your biopsy site feels warm to the touch.  You have pus or a bad smell coming from your biopsy site.  You have a fever. Get help right away if:  You have bleeding that does not stop with pressure or a dressing. This information is not intended to replace advice given to you by your health care provider. Make sure you discuss any questions you have with your health care provider. Document Released: 02/15/2015 Document Revised: 09/15/2015 Document Reviewed: 04/18/2014 Elsevier Interactive Patient Education  [employer]2018 [employer]vier Inc.

## 2016-11-27 ENCOUNTER — Telehealth: Payer: Self-pay | Admitting: *Deleted

## 2016-11-27 ENCOUNTER — Other Ambulatory Visit: Payer: Self-pay | Admitting: Internal Medicine

## 2016-11-27 DIAGNOSIS — M109 Gout, unspecified: Secondary | ICD-10-CM

## 2016-11-27 NOTE — Telephone Encounter (Signed)
Patient verified DOB Patient is aware of labs being normal and to continue with potassium supplements. No further questions.

## 2016-11-27 NOTE — Telephone Encounter (Signed)
-----   Message from Quentin Angstlugbemiga E Jegede, MD sent at 11/25/2016  9:14 AM EDT ----- Normal result. Please continue potassium supplement as prescribed

## 2016-12-01 ENCOUNTER — Telehealth: Payer: Self-pay | Admitting: Family Medicine

## 2016-12-01 MED ORDER — TERBINAFINE HCL 250 MG PO TABS: 250.0000 mg | ORAL_TABLET | Freq: Every day | ORAL | 0 refills | Status: AC

## 2016-12-01 NOTE — Telephone Encounter (Signed)
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 She endorsed resolution of her scalp pain today s/p intralesional steroid injection as well.  Will continue topical steroid as well as start Terbinafine. Her most recent Hepatic panel were normal. Will need to recheck after completing 4 weeks treatment of Terbinafine.  Will forward message to her PCP as well to f/u with hepatic panel.  BEFORE TREATMENT        AFTER TREATMENT

## 2016-12-08 ENCOUNTER — Encounter: Payer: Self-pay | Admitting: Pharmacist

## 2016-12-08 NOTE — Progress Notes (Signed)
Prior authorization completed and approved for Tylenol #3 x 180 days.

## 2017-01-07 ENCOUNTER — Ambulatory Visit (INDEPENDENT_AMBULATORY_CARE_PROVIDER_SITE_OTHER): Payer: Medicaid Other | Admitting: Family Medicine

## 2017-01-07 ENCOUNTER — Other Ambulatory Visit: Payer: Self-pay

## 2017-01-07 ENCOUNTER — Ambulatory Visit (INDEPENDENT_AMBULATORY_CARE_PROVIDER_SITE_OTHER): Payer: Medicaid Other | Admitting: Specialist

## 2017-01-07 ENCOUNTER — Encounter (INDEPENDENT_AMBULATORY_CARE_PROVIDER_SITE_OTHER): Payer: Self-pay | Admitting: Specialist

## 2017-01-07 VITALS — BP 146/82 | HR 84 | Ht 66.0 in | Wt 169.0 lb

## 2017-01-07 VITALS — BP 118/74 | HR 71 | Temp 98.8°F | Wt 174.0 lb

## 2017-01-07 DIAGNOSIS — L239 Allergic contact dermatitis, unspecified cause: Secondary | ICD-10-CM | POA: Insufficient documentation

## 2017-01-07 DIAGNOSIS — L661 Lichen planopilaris, unspecified: Secondary | ICD-10-CM

## 2017-01-07 DIAGNOSIS — M1712 Unilateral primary osteoarthritis, left knee: Secondary | ICD-10-CM | POA: Diagnosis not present

## 2017-01-07 DIAGNOSIS — Z96651 Presence of right artificial knee joint: Secondary | ICD-10-CM

## 2017-01-07 MED ORDER — BUPIVACAINE HCL 0.5 % IJ SOLN
3.0000 mL | INTRAMUSCULAR | Status: AC | PRN
  Administered 2017-01-07: 3 mL via INTRA_ARTICULAR

## 2017-01-07 MED ORDER — METHYLPREDNISOLONE ACETATE 40 MG/ML IJ SUSP
40.0000 mg | INTRAMUSCULAR | Status: AC | PRN
  Administered 2017-01-07: 40 mg via INTRA_ARTICULAR

## 2017-01-07 NOTE — Patient Instructions (Addendum)
It was nice meeting you today [name]!  The spot on your scalp is looking much better; please continue putting the liquid on it for at least another month.  It looks like you have a hypersensitivity reaction on your arms and back, which may be due to bug bites or something else.  Please wear long sleeve shirts if possible and try not to scratch the skin, since this will keep it from healing.  Please buy Eucerin cream (there are bigger quantities available than in the picture that will work) and Aveeno with hydrocortisone and mix these together.  Apply them once or twice per day to your arms and back.  Please come to the clinic the next time you need us.  If you have any questions or concerns, please feel free to call the clinic.   Be well,  Dr. Frances FurbishWinfrey

## 2017-01-07 NOTE — Progress Notes (Signed)
Subjective:    Stacy Linamela Clinch - 56 y.o. female MRN [id number]  Date of birth: September 20, 1960  HPI  Stacy [name] is here for follow up on a scalp lesion.  She also was questions regarding a new rash on her upper arms and back.  Scalp lesion - appeared to be lichen planopilaris clinically, so given topical fluocinonide - biopsy showed tinea capitis endothrix, so prescribed oral terbinafine - patient thinks area is improving but does note some tenderness when she hits it with her comb - patient did not realize that that the terbinafine was oral rather than topical and says her prescriptions are given to her in a vacuumed bag rather than in bottles, so she does not know which medications she is currently taking  Rash on upper arms and back - itchy rash; she is scratching it frequently - does not appear to be healing - has not tried anything to make it better - denies any known exposure to irritants or bugs   Health Maintenance:  Health Maintenance Due  Topic Date Due  . Hepatitis C Screening  0August 18, 1962  . FOOT EXAM  10/26/1969  . OPHTHALMOLOGY EXAM  10/26/1969  . HIV Screening  10/27/1974    -  reports that she has been smoking cigarettes.  She has a 8.00 pack-year smoking history. she has never used smokeless tobacco. - Review of Systems: Per HPI. - Past Medical History: Patient Active Problem List   Diagnosis Date Noted  . 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 Herniation of lumbar intervertebral disc with radiculopathy 10/02/2016    Class: Chronic  . Status post laminectomy 10/02/2016  . Alopecia areata 08/19/2016  . Gastroenteritis 03/11/2016  . postop Arthrofibrosis of knee joint, right 11/29/2015  . Pap smear for cervical cancer screening 10/24/2015  . Healthcare maintenance 10/24/2015  . Osteoarthritis of right knee 09/06/2015  . Type 2 diabetes mellitus without complication, without long-term current use of insulin (HCC)  06/13/2015  . Neurogenic claudication due to lumbar spinal stenosis 06/03/2015    Class: Chronic  . Chondromalacia of both patellae 06/03/2015    Class: Chronic  . Spinal stenosis, lumbar region, with neurogenic claudication 06/03/2015  . Tobacco use disorder 04/25/2015  . DJD (degenerative joint disease) of knee 01/04/2015  . Falls frequently 11/29/2014  . Hemorrhoid 11/14/2014  . Bilateral knee pain 07/31/2014  . Asthma, chronic 07/19/2014  . Gout of big toe 07/19/2014  . Midline low back pain with right-sided sciatica 01/01/2014  . Bilateral low back pain without sciatica 08/14/2013  . Essential hypertension 08/14/2013  . Gastroesophageal reflux disease without esophagitis 08/14/2013  . COPD exacerbation (HCC) 08/14/2013  . Colon cancer screening 08/14/2013  . Low back pain 05/15/2013  . Preventative health care 05/15/2013  . GERD (gastroesophageal reflux disease) 02/16/2013  . DM (diabetes mellitus) (HCC) 02/16/2013  . COPD (chronic obstructive pulmonary disease) (HCC) 04/17/2011  . Hypertension 04/17/2011  . Cough 04/16/2011   - Medications: reviewed and updated   Objective:   Physical Exam BP 118/74   Pulse 71   Temp 98.8 F (37.1 C) (Oral)   Wt 174 lb (78.9 kg)   SpO2 98%   BMI 28.08 kg/m  Gen: NAD, alert, cooperative with exam, well-appearing HEENT: NCAT, clear conjunctiva Resp: CTABL, no wheezes, non-labored Skin: site of scalp lesion much improved from previous pictures; no open areas, no erythema, some hair growth has returned.  Rash on upper arms and back with open areas and areas of  excoriation; signs of healed rash in the same area.  No signs of bug bites. Neuro: no gross deficits.  Psych: good insight, alert and oriented        Assessment & Plan:   Dermal hypersensitivity reaction Patient recommended to buy OTC Eucerin and Aveeno with hydrocortisone, mix these together, and apply to affected area once or twice daily. Encouraged to try not to scratch  these areas and to cut down on cigarette smoking since this can inhibit healing.  Lichen planopilaris Since this area is healing well, we will not change management.  Patient can continue to apply fluocinonide for the next month.    Lezlie OctaveAmanda Ayianna Darnold, M.D. 01/07/2017, 4:48 PM PGY-1, [facility]

## 2017-01-07 NOTE — Progress Notes (Signed)
Today.

## 2017-01-07 NOTE — Progress Notes (Signed)
Office Visit Note   Patient: Stacy [name]           Date of Birth: [birth year]           MRN: [id number] Visit Date: 01/07/2017              Requested by: Stacy AngstJegede, Olugbemiga E, MD [name] [place], [place] [id number] PCP: Stacy AngstJegede, Olugbemiga E, MD   Assessment & Plan: Visit Diagnoses:  1. Primary osteoarthritis of left knee   2. Hx of total knee arthroplasty, right     Plan: Avoid frequent bending and stooping  No lifting greater than 10 lbs. May use ice or moist heat for pain. Weight loss is of benefit. Handicap license is approved. Knee is suffering from osteoarthritis, only real proven treatments are Weight loss, NSIADs like diclofenac and exercise. Well padded shoes help. Ice the knee 2-3 times a day 15-20 mins at a time.   Follow-Up Instructions: No Follow-up on file.   Orders:  No orders of the defined types were placed in this encounter.  No orders of the defined types were placed in this encounter.     Procedures: Large Joint Inj: L knee on 01/07/2017 12:01 PM Indications: pain Details: 25 G 1.5 in needle, anterolateral approach  Arthrogram: No  Medications: 40 mg methylPREDNISolone acetate 40 MG/ML; 3 mL bupivacaine 0.5 % Outcome: tolerated well, no immediate complications  bandaid applied. Procedure, treatment alternatives, risks and benefits explained, specific risks discussed. Consent was given by the patient. Immediately prior to procedure a time out was called to verify the correct patient, procedure, equipment, support staff and site/side marked as required. Patient was prepped and draped in the usual sterile fashion.       Clinical Data: No additional findings.   Subjective: Chief Complaint  Patient presents with  . Lower Back - Follow-up  . Right Knee - Follow-up    56 year old female with history of bilateral knee pain patellofemoral osteoarthritis, pain worsened post accident about 2 years ago. Now with increasing left knee  pain with squatting standing and walking. Previous right TKR with post surgery manipulation x 2 1 year ago. She is s/p right L5-S1 microdiscectomy and lateral recess decompression 10/02/2016. Some numbness and tingling right. She does feel like the surgery helped. Pain in the knee is such that she wishes to undergo cortisone injection. Taking nothing for    Review of Systems  Constitutional: Negative.   HENT: Negative.   Eyes: Negative.   Respiratory: Negative.   Cardiovascular: Negative.   Gastrointestinal: Negative.   Endocrine: Negative.   Genitourinary: Negative.   Musculoskeletal: Negative.   Skin: Negative.   Allergic/Immunologic: Negative.   Neurological: Negative.   Hematological: Negative.   Psychiatric/Behavioral: Negative.      Objective: Vital Signs: BP (!) 146/82 (BP Location: Left Arm, Patient Position: Sitting)   Pulse 84   Ht 5\' 6"  (1.676 m)   Wt 169 lb (76.7 kg)   BMI 27.28 kg/m   Physical Exam  Constitutional: She is oriented to person, place, and time. She appears well-developed and well-nourished.  HENT:  Head: Normocephalic and atraumatic.  Eyes: EOM are normal. Pupils are equal, round, and reactive to light.  Neck: Normal range of motion. Neck supple.  Pulmonary/Chest: Effort normal and breath sounds normal.  Abdominal: Soft. Bowel sounds are normal.  Musculoskeletal: Normal range of motion.  Neurological: She is alert and oriented to person, place, and time.  Skin: Skin is warm and dry.  Psychiatric: She has a normal mood and affect. Her behavior is normal. Judgment and thought content normal.    Right Knee Exam   Range of Motion  Extension: 5  Flexion: 100   Tests  McMurray:  Medial - negative Lateral - negative Varus: negative Valgus: negative Patellar apprehension: negative  Other  Erythema: absent Scars: absent   Left Knee Exam  Left knee exam is normal.  Tenderness  The patient is experiencing tenderness in the  patella.  Range of Motion  Extension:  5 normal  Flexion: 140   Tests  McMurray:  Medial - negative Lateral - negative Varus: negative Valgus: negative Lachman:  Anterior - negative    Posterior - negative Drawer:  Anterior - negative     Posterior - negative Patellar apprehension: positive  Other  Erythema: absent Sensation: normal Pulse: present Swelling: mild      Specialty Comments:  No specialty comments available.  Imaging: No results found.   PMFS History: Patient Active Problem List   Diagnosis Date Noted  . Herniation of lumbar intervertebral disc with radiculopathy 10/02/2016    Priority: High    Class: Chronic  . Neurogenic claudication due to lumbar spinal stenosis 06/03/2015    Priority: High    Class: Chronic  . Chondromalacia of both patellae 06/03/2015    Priority: High    Class: Chronic  . 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 Status post laminectomy 10/02/2016  . Alopecia areata 08/19/2016  . Gastroenteritis 03/11/2016  . postop Arthrofibrosis of knee joint, right 11/29/2015  . Pap smear for cervical cancer screening 10/24/2015  . Healthcare maintenance 10/24/2015  . Osteoarthritis of right knee 09/06/2015  . Type 2 diabetes mellitus without complication, without long-term current use of insulin (HCC) 06/13/2015  . Spinal stenosis, lumbar region, with neurogenic claudication 06/03/2015  . Tobacco use disorder 04/25/2015  . DJD (degenerative joint disease) of knee 01/04/2015  . Falls frequently 11/29/2014  . Hemorrhoid 11/14/2014  . Bilateral knee pain 07/31/2014  . Asthma, chronic 07/19/2014  . Gout of big toe 07/19/2014  . Midline low back pain with right-sided sciatica 01/01/2014  . Bilateral low back pain without sciatica 08/14/2013  . Essential hypertension 08/14/2013  . Gastroesophageal reflux disease without esophagitis 08/14/2013  . COPD exacerbation (HCC) 08/14/2013  . Colon cancer screening 08/14/2013  . Low back pain 05/15/2013   . Preventative health care 05/15/2013  . GERD (gastroesophageal reflux disease) 02/16/2013  . DM (diabetes mellitus) (HCC) 02/16/2013  . COPD (chronic obstructive pulmonary disease) (HCC) 04/17/2011  . Hypertension 04/17/2011  . Cough 04/16/2011   Past Medical History:  Diagnosis Date  . Arthritis   . 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 Asthma   . COPD (chronic obstructive pulmonary disease) (HCC)   . Diabetes mellitus    Type II  . GERD (gastroesophageal reflux disease)    Pt on Protonix daily  . Glaucoma   . Gout   . Headache(784.0)    otc meds prn  . Hyperlipidemia   . Hypertension    Pt on lisinopril  . Irritable bowel syndrome 11/19/2010  . Neuropathy   . Pneumonia   . Restless legs   . Shortness of breath    occasional - uses breathing tx at home    Family History  Problem Relation Age of Onset  . Hypertension Father   . Cancer Father   . Heart disease Mother   . Asthma Son  had as a child  . Heart disease Sister   . Breast cancer Sister   . Hypertension Brother     Past Surgical History:  Procedure Laterality Date  . CHOLECYSTECTOMY    . COLONOSCOPY    . ENDOMETRIAL ABLATION  10/2010  . HERNIA REPAIR     umbicial hernia  . KNEE CLOSED REDUCTION Right 12/06/2015   Procedure: CLOSED MANIPULATION RIGHT KNEE;  Surgeon: Kerrin Champagne, MD;  Location: [facility] OR;  Service: Orthopedics;  Laterality: Right;  . KNEE CLOSED REDUCTION Right 01/17/2016   Procedure: CLOSED MANIPULATION RIGHT KNEE;  Surgeon: Kerrin Champagne, MD;  Location: [facility] OR;  Service: Orthopedics;  Laterality: Right;  . KNEE JOINT MANIPULATION Right 12/06/2015  . LUMBAR DISC SURGERY  06/03/2015   L 2  L3 L4 L5   . LUMBAR LAMINECTOMY/DECOMPRESSION MICRODISCECTOMY N/A 06/03/2015   Procedure: Bilateral lateral recess decompression L2-3, L3-4, L4-5;  Surgeon: Kerrin Champagne, MD;  Location: [facility] OR;  Service: Orthopedics;  Laterality: N/A;  . LUMBAR LAMINECTOMY/DECOMPRESSION  MICRODISCECTOMY N/A 10/02/2016   Procedure: Right L5-S1 Lateral Recess Decompression  microdiscectomy;  Surgeon: Kerrin Champagne, MD;  Location: [facility] OR;  Service: Orthopedics;  Laterality: N/A;  . svd      x 2  . TOTAL KNEE ARTHROPLASTY Right 09/06/2015   Procedure: RIGHT TOTAL KNEE ARTHROPLASTY;  Surgeon: Kerrin Champagne, MD;  Location: [facility] OR;  Service: Orthopedics;  Laterality: Right;  . TUBAL LIGATION    . UPPER GASTROINTESTINAL ENDOSCOPY  04/28/11   Social History   Occupational History  . Occupation: unemployed    Associate Professor: UNEMPLOYED  Tobacco Use  . Smoking status: Current Some Day Smoker    Packs/day: 0.25    Years: 32.00    Pack years: 8.00    Types: Cigarettes  . Smokeless tobacco: Never Used  Substance and Sexual Activity  . Alcohol use: No  . Drug use: No  . Sexual activity: Yes    Birth control/protection: Surgical

## 2017-01-07 NOTE — Patient Instructions (Addendum)
Avoid frequent bending and stooping  No lifting greater than 10 lbs. May use ice or moist heat for pain. Weight loss is of benefit. Handicap license is approved.  Knee is suffering from osteoarthritis, only real proven treatments are Weight loss, NSIADs like diclofenac and exercise. Well padded shoes help. Ice the knee 2-3 times a day 15-20 mins at a time.   

## 2017-01-07 NOTE — Assessment & Plan Note (Signed)
Since this area is healing well, we will not change management.  Patient can continue to apply fluocinonide for the next month.

## 2017-01-07 NOTE — Assessment & Plan Note (Signed)
Patient recommended to buy OTC Eucerin and Aveeno with hydrocortisone, mix these together, and apply to affected area once or twice daily. Encouraged to try not to scratch these areas and to cut down on cigarette smoking since this can inhibit healing.

## 2017-01-08 ENCOUNTER — Ambulatory Visit: Payer: Medicaid Other | Admitting: Family Medicine

## 2017-01-11 ENCOUNTER — Ambulatory Visit: Payer: Medicaid Other | Admitting: Podiatry

## 2017-01-20 ENCOUNTER — Other Ambulatory Visit: Payer: Self-pay | Admitting: Pharmacist

## 2017-01-20 MED ORDER — MITIGARE 0.6 MG PO CAPS: 1.0000 | ORAL_CAPSULE | Freq: Every day | ORAL | 3 refills | Status: DC

## 2017-01-20 NOTE — Telephone Encounter (Signed)
Generic colchicine is not covered by [place] [employer] as of 01/04/17. Switch to brand Mitigare

## 2017-01-22 ENCOUNTER — Encounter (HOSPITAL_COMMUNITY): Payer: Self-pay | Admitting: Emergency Medicine

## 2017-01-22 ENCOUNTER — Other Ambulatory Visit: Payer: Self-pay

## 2017-01-22 ENCOUNTER — Emergency Department (HOSPITAL_COMMUNITY)
Admission: EM | Admit: 2017-01-22 | Discharge: 2017-01-22 | Disposition: A | Discharge status: Home / Self Care | Payer: Medicaid Other | Attending: Physician Assistant | Admitting: Physician Assistant

## 2017-01-22 DIAGNOSIS — J449 Chronic obstructive pulmonary disease, unspecified: Secondary | ICD-10-CM | POA: Insufficient documentation

## 2017-01-22 DIAGNOSIS — E119 Type 2 diabetes mellitus without complications: Secondary | ICD-10-CM | POA: Diagnosis not present

## 2017-01-22 DIAGNOSIS — H02846 Edema of left eye, unspecified eyelid: Secondary | ICD-10-CM | POA: Insufficient documentation

## 2017-01-22 DIAGNOSIS — J45909 Unspecified asthma, uncomplicated: Secondary | ICD-10-CM | POA: Diagnosis not present

## 2017-01-22 DIAGNOSIS — I10 Essential (primary) hypertension: Secondary | ICD-10-CM | POA: Insufficient documentation

## 2017-01-22 DIAGNOSIS — Z79899 Other long term (current) drug therapy: Secondary | ICD-10-CM | POA: Diagnosis not present

## 2017-01-22 DIAGNOSIS — Z7984 Long term (current) use of oral hypoglycemic drugs: Secondary | ICD-10-CM | POA: Diagnosis not present

## 2017-01-22 DIAGNOSIS — Z96651 Presence of right artificial knee joint: Secondary | ICD-10-CM | POA: Insufficient documentation

## 2017-01-22 DIAGNOSIS — R600 Localized edema: Secondary | ICD-10-CM | POA: Diagnosis present

## 2017-01-22 DIAGNOSIS — F17210 Nicotine dependence, cigarettes, uncomplicated: Secondary | ICD-10-CM | POA: Diagnosis not present

## 2017-01-22 DIAGNOSIS — H1032 Unspecified acute conjunctivitis, left eye: Secondary | ICD-10-CM | POA: Insufficient documentation

## 2017-01-22 MED ORDER — ERYTHROMYCIN 5 MG/GM OP OINT: 1.0000 "application " | TOPICAL_OINTMENT | Freq: Four times a day (QID) | OPHTHALMIC | 0 refills | Status: AC

## 2017-01-22 MED ORDER — CEPHALEXIN 500 MG PO CAPS: 500.0000 mg | ORAL_CAPSULE | Freq: Two times a day (BID) | ORAL | 0 refills | Status: AC

## 2017-01-22 NOTE — ED Provider Notes (Signed)
[facility] DEPT Provider Note   CSN: [id number] Arrival date & time: 01/22/17  1045     History   Chief Complaint Chief Complaint  Patient presents with  . Facial Swelling    HPI Stacy [name] is a 56 y.o. female with a PMHx of DM2, COPD, asthma, glaucoma, gout, HTN, HLD, alopecia, dermal hypersensitivity, and other conditions listed below, who presents to the ED with complaints of left eyelid swelling that began when she woke up around 10 AM.  Patient states that yesterday evening she noticed that her eye was slightly itchy, she rubbed it and went to bed.  Upon awakening this morning she noticed that her left eyelid was swollen shut, and the eyelid is erythematous and slightly sore.  She describes the soreness as 7/10 constant sore nonradiating left upper eyelid pain, worse with palpation, and with no treatments tried prior to arrival.  She states that it still feels slightly itchy, and there is a foreign body sensation.  She noticed some yellowish crusting of her eyelids this morning.  She believes that her eye is also red but she is not sure.  No known exposure to sick contacts or conjunctivitis contacts.  She does not wear contact lenses.  She denies any changes in medications, soaps, detergents, cosmetics, animal or plant contact, or any other changes in her life recently.  She denies any known insect bites.  She denies any warmth to the eyelid, photophobia, vision changes, rashes elsewhere, fevers, chills, CP, SOB, abd pain, N/V/D/C, hematuria, dysuria, myalgias, arthralgias, numbness, tingling, focal weakness, or any other complaints at this time.    The history is provided by the patient and medical records. No language interpreter was used.  Eye Problem   This is a new problem. The current episode started 1 to 2 hours ago. The problem occurs constantly. The problem has not changed since onset.There is a problem in the left eye. There was no injury  mechanism. The pain is at a severity of 7/10. The pain is moderate. There is no known exposure to pink eye. She does not wear contacts. Associated symptoms include discharge, foreign body sensation, eye redness and itching. Pertinent negatives include no numbness, no blurred vision, no decreased vision, no double vision, no photophobia, no nausea, no vomiting, no tingling and no weakness. She has tried nothing for the symptoms. The treatment provided no relief.    Past Medical History:  Diagnosis Date  . Arthritis   . Arthrofibrosis of total knee replacement ([place])    right  . Asthma   . COPD (chronic obstructive pulmonary disease) ([place])   . Diabetes mellitus    Type II  . GERD (gastroesophageal reflux disease)    Pt on Protonix daily  . Glaucoma   . Gout   . Headache(784.0)    otc meds prn  . Hyperlipidemia   . Hypertension    Pt on lisinopril  . Irritable bowel syndrome 11/19/2010  . Neuropathy   . Pneumonia   . Restless legs   . Shortness of breath    occasional - uses breathing tx at home    Patient Active Problem List   Diagnosis Date Noted  . 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 Herniation of lumbar intervertebral disc with radiculopathy 10/02/2016    Class: Chronic  . Status post laminectomy 10/02/2016  . Alopecia areata 08/19/2016  . Gastroenteritis 03/11/2016  . postop Arthrofibrosis of knee joint, right 11/29/2015  . Pap  smear for cervical cancer screening 10/24/2015  . Healthcare maintenance 10/24/2015  . Osteoarthritis of right knee 09/06/2015  . Type 2 diabetes mellitus without complication, without long-term current use of insulin ([place]) 06/13/2015  . Neurogenic claudication due to lumbar spinal stenosis 06/03/2015    Class: Chronic  . Chondromalacia of both patellae 06/03/2015    Class: Chronic  . Spinal stenosis, lumbar region, with neurogenic claudication 06/03/2015  . Tobacco use disorder 04/25/2015  . DJD  (degenerative joint disease) of knee 01/04/2015  . Falls frequently 11/29/2014  . Hemorrhoid 11/14/2014  . Bilateral knee pain 07/31/2014  . Asthma, chronic 07/19/2014  . Gout of big toe 07/19/2014  . Midline low back pain with right-sided sciatica 01/01/2014  . Bilateral low back pain without sciatica 08/14/2013  . Essential hypertension 08/14/2013  . Gastroesophageal reflux disease without esophagitis 08/14/2013  . COPD exacerbation ([place]) 08/14/2013  . Colon cancer screening 08/14/2013  . Low back pain 05/15/2013  . Preventative health care 05/15/2013  . GERD (gastroesophageal reflux disease) 02/16/2013  . DM (diabetes mellitus) ([place]) 02/16/2013  . COPD (chronic obstructive pulmonary disease) ([place]) 04/17/2011  . Hypertension 04/17/2011  . Cough 04/16/2011    Past Surgical History:  Procedure Laterality Date  . CHOLECYSTECTOMY    . COLONOSCOPY    . ENDOMETRIAL ABLATION  10/2010  . HERNIA REPAIR     umbicial hernia  . KNEE CLOSED REDUCTION Right 12/06/2015   Procedure: CLOSED MANIPULATION RIGHT KNEE;  Surgeon: [name], MD;  Location: [facility];  Service: Orthopedics;  Laterality: Right;  . KNEE CLOSED REDUCTION Right 01/17/2016   Procedure: CLOSED MANIPULATION RIGHT KNEE;  Surgeon: [name], MD;  Location: [facility];  Service: Orthopedics;  Laterality: Right;  . KNEE JOINT MANIPULATION Right 12/06/2015  . LUMBAR DISC SURGERY  06/03/2015   L 2  L3 L4 L5   . LUMBAR LAMINECTOMY/DECOMPRESSION MICRODISCECTOMY N/A 06/03/2015   Procedure: Bilateral lateral recess decompression L2-3, L3-4, L4-5;  Surgeon: [name], MD;  Location: [facility];  Service: Orthopedics;  Laterality: N/A;  . LUMBAR LAMINECTOMY/DECOMPRESSION MICRODISCECTOMY N/A 10/02/2016   Procedure: Right L5-S1 Lateral Recess Decompression  microdiscectomy;  Surgeon: [name], MD;  Location: [facility];  Service: Orthopedics;  Laterality: N/A;  . svd      x 2  . TOTAL KNEE ARTHROPLASTY Right 09/06/2015   Procedure: RIGHT  TOTAL KNEE ARTHROPLASTY;  Surgeon: [name], MD;  Location: [facility];  Service: Orthopedics;  Laterality: Right;  . TUBAL LIGATION    . UPPER GASTROINTESTINAL ENDOSCOPY  04/28/11    OB History    No data available       Home Medications    Prior to Admission medications   Medication Sig Start Date End Date Taking? Authorizing Provider  acetaminophen-codeine (TYLENOL #3) 300-30 MG tablet Take 1 tablet by mouth every 4 (four) hours as needed. 11/26/16  Yes [name], MD  albuterol (PROVENTIL) (2.5 MG/3ML) 0.083% nebulizer solution USE 1 VIAL VIA NEBULIZER EVERY 4 HOURS AS NEEDED FOR WHEEZING 11/01/15  Yes Jegede, Olugbemiga E, MD  allopurinol (ZYLOPRIM) 100 MG tablet Take 1 tablet (100 mg total) by mouth daily. 11/23/16  Yes Tresa Garter, MD  cholecalciferol (VITAMIN D) 1000 units tablet Take 1,000 Units by mouth daily.   Yes [provider]  diclofenac sodium (VOLTAREN) 1 % GEL Apply 4 g topically 4 (four) times daily. 11/26/16  Yes [name], MD  fluocinonide (LIDEX) 0.05 % external solution Apply  1 application topically 2 (two) times daily. 10/09/16  Yes Kinnie Feil, MD  gabapentin (NEURONTIN) 300 MG capsule Take 1 capsule (300 mg total) by mouth 3 (three) times daily. 11/23/16  Yes Tresa Garter, MD  hydrOXYzine (ATARAX/VISTARIL) 10 MG tablet Take 1 tablet (10 mg total) by mouth 3 (three) times daily as needed for itching. 11/23/16  Yes Tresa Garter, MD  metFORMIN (GLUCOPHAGE-XR) 750 MG 24 hr tablet Take 1 tablet (750 mg total) by mouth daily with breakfast. 11/23/16  Yes Jegede, Olugbemiga E, MD  methocarbamol (ROBAXIN) 500 MG tablet Take 1 tablet (500 mg total) by mouth every 6 (six) hours as needed for muscle spasms. 11/23/16  Yes Jegede, Olugbemiga E, MD  MITIGARE 0.6 MG CAPS Take 1 capsule by mouth daily. 01/20/17  Yes Jegede, Marlena Clipper, MD  mometasone-formoterol (DULERA) 100-5 MCG/ACT AERO Inhale 2 puffs into the lungs 2 (two) times  daily. 06/10/16  Yes Tresa Garter, MD  Multiple Vitamin (MULTIVITAMIN WITH MINERALS) TABS tablet Take 1 tablet by mouth daily.   Yes [provider]  nicotine (NICODERM CQ - DOSED IN MG/24 HOURS) 14 mg/24hr patch Place 1 patch (14 mg total) onto the skin daily. 11/23/16  Yes Tresa Garter, MD  ondansetron (ZOFRAN ODT) 4 MG disintegrating tablet Take 1 tablet (4 mg total) by mouth every 8 (eight) hours as needed for nausea or vomiting. 11/23/16  Yes Tresa Garter, MD  pantoprazole (PROTONIX) 40 MG tablet Take 1 tablet (40 mg total) by mouth daily. 11/23/16  Yes Tresa Garter, MD  potassium chloride SA (K-DUR,KLOR-CON) 20 MEQ tablet Take 1 tablet (20 mEq total) by mouth daily. 08/19/16  Yes Tresa Garter, MD  simvastatin (ZOCOR) 20 MG tablet Take 1 tablet (20 mg total) by mouth at bedtime. 11/23/16  Yes Jegede, Marlena Clipper, MD  sucralfate (CARAFATE) 1 g tablet Take 1 g by mouth 4 (four) times daily -  with meals and at bedtime.   Yes [provider]  SYMBICORT 80-4.5 MCG/ACT inhaler INHALE TWO PUFFS BY MOUTH TWICE A DAY 06/26/16  Yes Jegede, Olugbemiga E, MD  valsartan-hydrochlorothiazide (DIOVAN-HCT) 160-12.5 MG tablet Take 1 tablet by mouth daily. 11/23/16  Yes Tresa Garter, MD  Blood Glucose Monitoring Suppl (ACCU-CHEK AVIVA PLUS) w/Device KIT 1 each by Does not apply route 3 (three) times daily. 06/10/16   Tresa Garter, MD  EPINEPHrine 0.3 mg/0.3 mL IJ SOAJ injection Inject 0.3 mLs (0.3 mg total) into the muscle once. Patient taking differently: Inject 0.3 mg into the muscle daily as needed (for allergic reaction).  09/10/14   Tresa Garter, MD  glucose blood (TRUE METRIX BLOOD GLUCOSE TEST) test strip USE AS DIRECTED BY PHYSICIAN 11/23/16   Tresa Garter, MD  hydrocortisone-pramoxine [facility]) 2.5-1 % rectal cream Place 1 application rectally 2 (two) times daily. Patient taking differently: Place 1 application rectally  2 (two) times daily as needed for hemorrhoids.  11/16/14   Arnoldo Morale, MD  Lancets (ACCU-CHEK SOFT TOUCH) lancets Use as instructed 11/23/16   Tresa Garter, MD    Family History Family History  Problem Relation Age of Onset  . Hypertension Father   . Cancer Father   . Heart disease Mother   . Asthma Son        had as a child  . Heart disease Sister   . Breast cancer Sister   . Hypertension Brother     Social History Social History   Tobacco  Use  . Smoking status: Current Some Day Smoker    Packs/day: 0.25    Years: 32.00    Pack years: 8.00    Types: Cigarettes  . Smokeless tobacco: Never Used  Substance Use Topics  . Alcohol use: No  . Drug use: No     Allergies   Other; Ace inhibitors; and Aspirin   Review of Systems Review of Systems  Constitutional: Negative for chills and fever.  HENT: Positive for facial swelling (L upper eyelid).   Eyes: Positive for pain (upper eyelid), discharge, redness and itching. Negative for blurred vision, double vision, photophobia and visual disturbance.  Respiratory: Negative for shortness of breath.   Cardiovascular: Negative for chest pain.  Gastrointestinal: Negative for abdominal pain, constipation, diarrhea, nausea and vomiting.  Genitourinary: Negative for dysuria and hematuria.  Musculoskeletal: Negative for arthralgias and myalgias.  Skin: Positive for itching. Negative for rash.  Allergic/Immunologic: Positive for immunocompromised state (DM2).  Neurological: Negative for tingling, weakness and numbness.  Psychiatric/Behavioral: Negative for confusion.   All other systems reviewed and are negative for acute change except as noted in the HPI.    Physical Exam Updated Vital Signs BP 131/81 (BP Location: Left Arm)   Pulse 84   Temp 98.5 F (36.9 C) (Oral)   Resp 13   SpO2 95%   Physical Exam  Constitutional: She is oriented to person, place, and time. Vital signs are normal. She appears well-developed  and well-nourished.  Non-toxic appearance. No distress.  Afebrile, nontoxic, NAD  HENT:  Head: Normocephalic and atraumatic.  Mouth/Throat: Mucous membranes are normal.  Eyes: EOM are normal. Pupils are equal, round, and reactive to light. Lids are everted and swept, no foreign bodies found. Right eye exhibits no discharge. Left eye exhibits chemosis and discharge. No foreign body present in the left eye. Right conjunctiva is not injected. Right conjunctiva has no hemorrhage. Left conjunctiva is injected. Left conjunctiva has no hemorrhage.  L upper eyelid with moderate swelling making it difficult to see the eye, pt not tolerating exam much so this limits it slightly. Mild tenderness to upper eyelid, mild erythema but no warmth of the eyelid. Mild purulent drainage from L eye, no pain with ocular movement, no photophobia, PERRL, and EOMI. L eye conjunctiva injected. R eye clear.   Neck: Normal range of motion. Neck supple.  Cardiovascular: Normal rate and intact distal pulses.  Pulmonary/Chest: Effort normal. No respiratory distress.  Abdominal: Normal appearance. She exhibits no distension.  Musculoskeletal: Normal range of motion.  Neurological: She is alert and oriented to person, place, and time. She has normal strength. No sensory deficit.  Skin: Skin is warm, dry and intact. No rash noted.  Psychiatric: She has a normal mood and affect. Her behavior is normal.  Nursing note and vitals reviewed.    ED Treatments / Results  Labs (all labs ordered are listed, but only abnormal results are displayed) Labs Reviewed - No data to display  EKG  EKG Interpretation None       Radiology No results found.  Procedures Procedures (including critical care time)  Medications Ordered in ED Medications - No data to display   Initial Impression / Assessment and Plan / ED Course  I have reviewed the triage vital signs and the nursing notes.  Pertinent labs & imaging results that were  available during my care of the patient were reviewed by me and considered in my medical decision making (see chart for details).     [place]  y.o. female here with L eyelid swelling and pain. Had itchy eye yesterday, rubbed it, then woke up with L eyelid swelling. On exam, L upper eyelid with moderate swelling making it difficult to see the eye, mild erythema but no warmth of the eyelid, mild purulent drainage from eye, no pain with ocular movement, no photophobia, PERRL and EOMI. Conjunctiva injected. Likely conjunctivitis, possibly just allergic vs bacterial, doubt periorbital cellulitis but given pt's immunocompromised state, will cover for possible early periorbital infection. Doubt need for fluroscein stain since treatment for bacterial conjunctivitis would be the same as for abrasion, and pt not tolerating exam enough to perform this. Will send home with erythromycin ointment and keflex. Advised cool compresses, OTC allergy meds, and f/up with ophthalmology in 2-3 days. I explained the diagnosis and have given explicit precautions to return to the ER including for any other new or worsening symptoms. The patient understands and accepts the medical plan as it's been dictated and I have answered their questions. Discharge instructions concerning home care and prescriptions have been given. The patient is STABLE and is discharged to home in good condition.    Final Clinical Impressions(s) / ED Diagnoses   Final diagnoses:  Acute conjunctivitis of left eye, unspecified acute conjunctivitis type  Swelling of eyelid, left    ED Discharge Orders        Ordered    erythromycin ophthalmic ointment  4 times daily     01/22/17 1155    cephALEXin (KEFLEX) 500 MG capsule  2 times daily     01/22/17 [address] [place], [place] 01/22/17 1203    Macarthur Critchley, MD 01/22/17 2059

## 2017-01-22 NOTE — ED Triage Notes (Signed)
Pt verbalizes awoke with left eye swelling; denies injury.

## 2017-01-22 NOTE — ED Notes (Signed)
Pt verbalizes understanding of d/c paperwork, follow up instructions, and medications. Pt A/O x4, ambulatory. All belongings with patient upon departure.  

## 2017-01-22 NOTE — Discharge Instructions (Signed)
Conjunctivitis can be  very contagious. Try to avoid rubbing eyes. Apply cool compresses and use prescribed eye drops/ointments as directed. Use oral antibiotic as directed until completed. Alternate between tylenol and motrin as needed for pain. Use over the counter benadryl or other antihistamine to help with symptoms. Follow up with the ophthalmologist in 2-3 days for recheck of symptoms. Return to the ER for changes or worsening symptoms.

## 2017-01-24 ENCOUNTER — Encounter (HOSPITAL_COMMUNITY): Payer: Self-pay | Admitting: Emergency Medicine

## 2017-01-24 ENCOUNTER — Other Ambulatory Visit: Payer: Self-pay

## 2017-01-24 ENCOUNTER — Emergency Department (HOSPITAL_COMMUNITY)
Admission: EM | Admit: 2017-01-24 | Discharge: 2017-01-24 | Disposition: A | Discharge status: Home / Self Care | Payer: Medicaid Other | Attending: Emergency Medicine | Admitting: Emergency Medicine

## 2017-01-24 DIAGNOSIS — Z96651 Presence of right artificial knee joint: Secondary | ICD-10-CM | POA: Diagnosis not present

## 2017-01-24 DIAGNOSIS — J45909 Unspecified asthma, uncomplicated: Secondary | ICD-10-CM | POA: Diagnosis not present

## 2017-01-24 DIAGNOSIS — Z79899 Other long term (current) drug therapy: Secondary | ICD-10-CM | POA: Insufficient documentation

## 2017-01-24 DIAGNOSIS — F17210 Nicotine dependence, cigarettes, uncomplicated: Secondary | ICD-10-CM | POA: Diagnosis not present

## 2017-01-24 DIAGNOSIS — H9203 Otalgia, bilateral: Secondary | ICD-10-CM | POA: Diagnosis present

## 2017-01-24 DIAGNOSIS — B9689 Other specified bacterial agents as the cause of diseases classified elsewhere: Secondary | ICD-10-CM

## 2017-01-24 DIAGNOSIS — E1140 Type 2 diabetes mellitus with diabetic neuropathy, unspecified: Secondary | ICD-10-CM | POA: Diagnosis not present

## 2017-01-24 DIAGNOSIS — Z7984 Long term (current) use of oral hypoglycemic drugs: Secondary | ICD-10-CM | POA: Insufficient documentation

## 2017-01-24 DIAGNOSIS — I10 Essential (primary) hypertension: Secondary | ICD-10-CM | POA: Insufficient documentation

## 2017-01-24 DIAGNOSIS — J449 Chronic obstructive pulmonary disease, unspecified: Secondary | ICD-10-CM | POA: Insufficient documentation

## 2017-01-24 DIAGNOSIS — J019 Acute sinusitis, unspecified: Secondary | ICD-10-CM | POA: Diagnosis not present

## 2017-01-24 LAB — CBG MONITORING, ED: Glucose-Capillary: 86 mg/dL (ref 65–99)

## 2017-01-24 MED ORDER — AMOXICILLIN-POT CLAVULANATE 875-125 MG PO TABS: 1.0000 | ORAL_TABLET | Freq: Two times a day (BID) | ORAL | 0 refills | Status: AC

## 2017-01-24 NOTE — ED Provider Notes (Signed)
[facility] DEPT Provider Note   CSN: [id number] Arrival date & time: 01/24/17  1411     History   Chief Complaint Chief Complaint  Patient presents with  . Otalgia    HPI Stacy [name] is a 56 y.o. female with a h/o of DM Type II, COPD, asthma, glaucoma, gout, HTN, HLD, alopecia, dermal hypersensitivity who presents to the emergency department with a chief complaint of bilateral otalgia that began 2 days ago, has remained constant, and has continued to worsen.  She describes the pain as feeling like my ears are clogged".  She reports associated intermittent tinnitus and bilateral frontal headache.  She denies fever, chills, dizziness, lightheadedness, syncope,  Weakness, numbness, sore throat, rhinorrhea, shortness of breath, or chest pain.  No aggravating or alleviating factors.  She was seen in the ED on 01/22/2017 for left eyelid swelling and pain with purulent drainage from the eye and was treated with erythromycin for bacterial conjunctivitis and covered with Keflex for possible early periorbital infection.  She has no complaints with her eyes at this time, including change in vision, periorbital swelling, drainage, foreign body sensation, or redness.  She has been compliant with home Keflex.  No sick contacts.  The history is provided by the patient. No language interpreter was used.  Otalgia  Associated symptoms include headaches. Pertinent negatives include no ear discharge, no hearing loss, no sore throat, no abdominal pain and no rash.    Past Medical History:  Diagnosis Date  . Arthritis   . Arthrofibrosis of total knee replacement ([facility])    right  . Asthma   . COPD (chronic obstructive pulmonary disease) ([facility])   . Diabetes mellitus    Type II  . GERD (gastroesophageal reflux disease)    Pt on Protonix daily  . Glaucoma   . Gout   . Headache(784.0)    otc meds prn  . Hyperlipidemia   . Hypertension    Pt on lisinopril  .  Irritable bowel syndrome 11/19/2010  . Neuropathy   . Pneumonia   . Restless legs   . Shortness of breath    occasional - uses breathing tx at home    Patient Active Problem List   Diagnosis Date Noted  . 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 Herniation of lumbar intervertebral disc with radiculopathy 10/02/2016    Class: Chronic  . Status post laminectomy 10/02/2016  . Alopecia areata 08/19/2016  . Gastroenteritis 03/11/2016  . postop Arthrofibrosis of knee joint, right 11/29/2015  . Pap smear for cervical cancer screening 10/24/2015  . Healthcare maintenance 10/24/2015  . Osteoarthritis of right knee 09/06/2015  . Type 2 diabetes mellitus without complication, without long-term current use of insulin ([facility]) 06/13/2015  . Neurogenic claudication due to lumbar spinal stenosis 06/03/2015    Class: Chronic  . Chondromalacia of both patellae 06/03/2015    Class: Chronic  . Spinal stenosis, lumbar region, with neurogenic claudication 06/03/2015  . Tobacco use disorder 04/25/2015  . DJD (degenerative joint disease) of knee 01/04/2015  . Falls frequently 11/29/2014  . Hemorrhoid 11/14/2014  . Bilateral knee pain 07/31/2014  . Asthma, chronic 07/19/2014  . Gout of big toe 07/19/2014  . Midline low back pain with right-sided sciatica 01/01/2014  . Bilateral low back pain without sciatica 08/14/2013  . Essential hypertension 08/14/2013  . Gastroesophageal reflux disease without esophagitis 08/14/2013  . COPD exacerbation ([facility]) 08/14/2013  . Colon cancer screening 08/14/2013  . Low back pain  05/15/2013  . Preventative health care 05/15/2013  . GERD (gastroesophageal reflux disease) 02/16/2013  . DM (diabetes mellitus) ([facility]) 02/16/2013  . COPD (chronic obstructive pulmonary disease) ([facility]) 04/17/2011  . Hypertension 04/17/2011  . Cough 04/16/2011    Past Surgical History:  Procedure Laterality Date  . CHOLECYSTECTOMY    . COLONOSCOPY     . ENDOMETRIAL ABLATION  10/2010  . HERNIA REPAIR     umbicial hernia  . KNEE CLOSED REDUCTION Right 12/06/2015   Procedure: CLOSED MANIPULATION RIGHT KNEE;  Surgeon: [name], MD;  Location: [facility];  Service: Orthopedics;  Laterality: Right;  . KNEE CLOSED REDUCTION Right 01/17/2016   Procedure: CLOSED MANIPULATION RIGHT KNEE;  Surgeon: [name], MD;  Location: [facility];  Service: Orthopedics;  Laterality: Right;  . KNEE JOINT MANIPULATION Right 12/06/2015  . LUMBAR DISC SURGERY  06/03/2015   L 2  L3 L4 L5   . LUMBAR LAMINECTOMY/DECOMPRESSION MICRODISCECTOMY N/A 06/03/2015   Procedure: Bilateral lateral recess decompression L2-3, L3-4, L4-5;  Surgeon: [name], MD;  Location: [facility];  Service: Orthopedics;  Laterality: N/A;  . LUMBAR LAMINECTOMY/DECOMPRESSION MICRODISCECTOMY N/A 10/02/2016   Procedure: Right L5-S1 Lateral Recess Decompression  microdiscectomy;  Surgeon: [name], MD;  Location: [facility];  Service: Orthopedics;  Laterality: N/A;  . svd      x 2  . TOTAL KNEE ARTHROPLASTY Right 09/06/2015   Procedure: RIGHT TOTAL KNEE ARTHROPLASTY;  Surgeon: [name], MD;  Location: [facility];  Service: Orthopedics;  Laterality: Right;  . TUBAL LIGATION    . UPPER GASTROINTESTINAL ENDOSCOPY  04/28/11    OB History    No data available       Home Medications    Prior to Admission medications   Medication Sig Start Date End Date Taking? Authorizing Provider  acetaminophen-codeine (TYLENOL #3) 300-30 MG tablet Take 1 tablet by mouth every 4 (four) hours as needed. 11/26/16   [name], MD  albuterol (PROVENTIL) (2.5 MG/3ML) 0.083% nebulizer solution USE 1 VIAL VIA NEBULIZER EVERY 4 HOURS AS NEEDED FOR WHEEZING 11/01/15   Tresa Garter, MD  allopurinol (ZYLOPRIM) 100 MG tablet Take 1 tablet (100 mg total) by mouth daily. 11/23/16   Tresa Garter, MD  amoxicillin-clavulanate (AUGMENTIN) 875-125 MG tablet Take 1 tablet by mouth every 12 (twelve) hours for 10 days.  01/24/17 02/03/17  McDonald, Mia A, PA-C  Blood Glucose Monitoring Suppl (ACCU-CHEK AVIVA PLUS) w/Device KIT 1 each by Does not apply route 3 (three) times daily. 06/10/16   Tresa Garter, MD  cephALEXin (KEFLEX) 500 MG capsule Take 1 capsule (500 mg total) by mouth 2 (two) times daily for 7 days. 01/22/17 01/29/17  Street, Mercedes, PA-C  cholecalciferol (VITAMIN D) 1000 units tablet Take 1,000 Units by mouth daily.    [provider]  diclofenac sodium (VOLTAREN) 1 % GEL Apply 4 g topically 4 (four) times daily. 11/26/16   [name], MD  EPINEPHrine 0.3 mg/0.3 mL IJ SOAJ injection Inject 0.3 mLs (0.3 mg total) into the muscle once. Patient taking differently: Inject 0.3 mg into the muscle daily as needed (for allergic reaction).  09/10/14   Tresa Garter, MD  erythromycin ophthalmic ointment Place 1 application into the left eye 4 (four) times daily for 7 days. Apply thin 1/2 inch ribbon to lower eyelid every 6 hours x 7 days 01/22/17 01/29/17  Street, [place], PA-C  fluocinonide (LIDEX) 0.05 % external solution Apply 1 application topically  2 (two) times daily. 10/09/16   Kinnie Feil, MD  gabapentin (NEURONTIN) 300 MG capsule Take 1 capsule (300 mg total) by mouth 3 (three) times daily. 11/23/16   Tresa Garter, MD  glucose blood (TRUE METRIX BLOOD GLUCOSE TEST) test strip USE AS DIRECTED BY PHYSICIAN 11/23/16   Tresa Garter, MD  hydrocortisone-pramoxine [facility]) 2.5-1 % rectal cream Place 1 application rectally 2 (two) times daily. Patient taking differently: Place 1 application rectally 2 (two) times daily as needed for hemorrhoids.  11/16/14   Arnoldo Morale, MD  hydrOXYzine (ATARAX/VISTARIL) 10 MG tablet Take 1 tablet (10 mg total) by mouth 3 (three) times daily as needed for itching. 11/23/16   Tresa Garter, MD  Lancets (ACCU-CHEK SOFT TOUCH) lancets Use as instructed 11/23/16   Tresa Garter, MD  metFORMIN (GLUCOPHAGE-XR) 750 MG 24  hr tablet Take 1 tablet (750 mg total) by mouth daily with breakfast. 11/23/16   Tresa Garter, MD  methocarbamol (ROBAXIN) 500 MG tablet Take 1 tablet (500 mg total) by mouth every 6 (six) hours as needed for muscle spasms. 11/23/16   Tresa Garter, MD  MITIGARE 0.6 MG CAPS Take 1 capsule by mouth daily. 01/20/17   Tresa Garter, MD  mometasone-formoterol (DULERA) 100-5 MCG/ACT AERO Inhale 2 puffs into the lungs 2 (two) times daily. 06/10/16   Tresa Garter, MD  Multiple Vitamin (MULTIVITAMIN WITH MINERALS) TABS tablet Take 1 tablet by mouth daily.    [provider]  nicotine (NICODERM CQ - DOSED IN MG/24 HOURS) 14 mg/24hr patch Place 1 patch (14 mg total) onto the skin daily. 11/23/16   Tresa Garter, MD  ondansetron (ZOFRAN ODT) 4 MG disintegrating tablet Take 1 tablet (4 mg total) by mouth every 8 (eight) hours as needed for nausea or vomiting. 11/23/16   Tresa Garter, MD  pantoprazole (PROTONIX) 40 MG tablet Take 1 tablet (40 mg total) by mouth daily. 11/23/16   Tresa Garter, MD  potassium chloride SA (K-DUR,KLOR-CON) 20 MEQ tablet Take 1 tablet (20 mEq total) by mouth daily. 08/19/16   Tresa Garter, MD  simvastatin (ZOCOR) 20 MG tablet Take 1 tablet (20 mg total) by mouth at bedtime. 11/23/16   Tresa Garter, MD  sucralfate (CARAFATE) 1 g tablet Take 1 g by mouth 4 (four) times daily -  with meals and at bedtime.    [provider]  SYMBICORT 80-4.5 MCG/ACT inhaler INHALE TWO PUFFS BY MOUTH TWICE A DAY 06/26/16   Jegede, Olugbemiga E, MD  valsartan-hydrochlorothiazide (DIOVAN-HCT) 160-12.5 MG tablet Take 1 tablet by mouth daily. 11/23/16   Tresa Garter, MD    Family History Family History  Problem Relation Age of Onset  . Hypertension Father   . Cancer Father   . Heart disease Mother   . Asthma Son        had as a child  . Heart disease Sister   . Breast cancer Sister   . Hypertension Brother      Social History Social History   Tobacco Use  . Smoking status: Current Some Day Smoker    Packs/day: 0.25    Years: 32.00    Pack years: 8.00    Types: Cigarettes  . Smokeless tobacco: Never Used  Substance Use Topics  . Alcohol use: No  . Drug use: No     Allergies   Other; Ace inhibitors; and Aspirin   Review of Systems Review of Systems  Constitutional:  Negative for activity change, chills and fever.  HENT: Positive for ear pain, sinus pressure, sinus pain and tinnitus. Negative for congestion, dental problem, ear discharge, facial swelling, hearing loss and sore throat.   Eyes: Negative for pain, discharge, redness and visual disturbance.  Respiratory: Negative for shortness of breath.   Cardiovascular: Negative for chest pain.  Gastrointestinal: Negative for abdominal pain.  Musculoskeletal: Negative for back pain.  Skin: Negative for rash.  Neurological: Positive for headaches. Negative for dizziness, syncope, weakness and numbness.     Physical Exam Updated Vital Signs BP (!) 137/91 (BP Location: Right Arm)   Pulse 73   Temp 98.3 F (36.8 C) (Oral)   Resp 16   Ht _0  (1.676 m)   Wt 78.9 kg (174 lb)   SpO2 99%   BMI 28.08 kg/m   Physical Exam  Constitutional: No distress.  HENT:  Head: Normocephalic.  Right Ear: External ear normal. Tympanic membrane is injected and erythematous. A middle ear effusion is present.  Left Ear: External ear normal. Tympanic membrane is not injected and not erythematous. A middle ear effusion is present.  Nose: No mucosal edema, rhinorrhea or nose lacerations. Right sinus exhibits frontal sinus tenderness. Right sinus exhibits no maxillary sinus tenderness. Left sinus exhibits frontal sinus tenderness. Left sinus exhibits no maxillary sinus tenderness.  Mouth/Throat: Uvula is midline, oropharynx is clear and moist and mucous membranes are normal. No tonsillar exudate.  No pain with movement of the tragus or auricle.  No  tenderness to palpation over the bilateral mastoid process.   No facial swelling.  No swelling to the peri-orbital skin.  No overlying erythema, edema or warmth.  No pain with EOM.  Eyes: Conjunctivae and EOM are normal. Pupils are equal, round, and reactive to light.  Neck: Neck supple.  Cardiovascular: Normal rate, regular rhythm, normal heart sounds and intact distal pulses. Exam reveals no gallop and no friction rub.  No murmur heard. Pulmonary/Chest: Effort normal. No stridor. No respiratory distress. She has no wheezes. She has no rales. She exhibits no tenderness.  Abdominal: Soft. She exhibits no distension.  Neurological: She is alert.  Cranial nerves II through XII grossly intact.  Skin: Skin is warm. No rash noted.  Psychiatric: Her behavior is normal.  Nursing note and vitals reviewed.    ED Treatments / Results  Labs (all labs ordered are listed, but only abnormal results are displayed) Labs Reviewed  CBG MONITORING, ED    EKG  EKG Interpretation None       Radiology No results found.  Procedures Procedures (including critical care time)  Medications Ordered in ED Medications - No data to display   Initial Impression / Assessment and Plan / ED Course  I have reviewed the triage vital signs and the nursing notes.  Pertinent labs & imaging results that were available during my care of the patient were reviewed by me and considered in my medical decision making (see chart for details).     56 year old female with a h/o of DM Type II, COPD, asthma, glaucoma, gout, HTN, HLD, alopecia, dermal hypersensitivity with bilateral otalgia times 2 days.  She was seen in the ED on 01/22/2017 and treated for bacterial conjunctivitis with questionable early preseptal cellulitis. Eyes are PERRL and no pain with EOM.  No periorbital edema or erythema or warmth.  She has bilateral frontal sinus tenderness with bilateral middle ear effusion.  The right TM appears injected and  erythematous.  She has been compliant with  home Keflex, will change the patient to Augmentin to cover for acute bacterial sinusitis given her history of immunocompromise state and worsening symptoms.  Doubt meningitis, mastoiditis, preseptal or orbital cellulitis.  CBG today 86.  Discussed the patient with Dr. Sherry Ruffing, attending physician who is in agreement with the evaluation plan.  Strict return precautions given.  No acute distress.  Patient is safe for discharge at this time.  Final Clinical Impressions(s) / ED Diagnoses   Final diagnoses:  Acute bacterial sinusitis    ED Discharge Orders        Ordered    amoxicillin-clavulanate (AUGMENTIN) 875-125 MG tablet  Every 12 hours     01/24/17 1636       Joline Maxcy A, PA-C 01/24/17 1648    Tegeler, Gwenyth Allegra, MD 01/24/17 1755

## 2017-01-24 NOTE — ED Notes (Addendum)
Pt is alert and oriented x 4 and is verbally responsive. Pt reports that she has been having Bilat. Ear pain/ pressure x 3 days. Pt reports that she took tylenol for pain and was not effective. Pt does report having a mild HA. Pt n/v and dizziness.

## 2017-01-24 NOTE — Discharge Instructions (Signed)
Stop taking the Keflex antibiotic that you were prescribed 2 days ago.  Start taking the Augmentin antibiotic that I have provided along with his paperwork.  Take this medication once every 12 hours for the next 10 days.  You can take at thousand milligrams of Tylenol once every 8 hours as needed for pain and inflammation.  Ocean nasal spray is available over-the-counter and can be used once in each nostril as needed for help with congestion and sinus pressure or headache.  If you develop new or worsening symptoms, including pain with movement of the eyes, a severe headache that does not improve, fever, or chills, swelling to the face, shortness of breath, or chest pain, please return to the emergency department for reevaluation.

## 2017-01-24 NOTE — ED Triage Notes (Signed)
Patient reports that she having bilat ear pain since Friday. Feels like ear are clogged.

## 2017-02-08 ENCOUNTER — Ambulatory Visit: Payer: Medicaid Other | Admitting: Podiatry

## 2017-02-08 ENCOUNTER — Ambulatory Visit (INDEPENDENT_AMBULATORY_CARE_PROVIDER_SITE_OTHER): Payer: Medicaid Other

## 2017-02-08 ENCOUNTER — Encounter: Payer: Self-pay | Admitting: Podiatry

## 2017-02-08 ENCOUNTER — Other Ambulatory Visit: Payer: Self-pay | Admitting: Podiatry

## 2017-02-08 DIAGNOSIS — M722 Plantar fascial fibromatosis: Secondary | ICD-10-CM

## 2017-02-08 DIAGNOSIS — M79676 Pain in unspecified toe(s): Secondary | ICD-10-CM

## 2017-02-08 DIAGNOSIS — M79674 Pain in right toe(s): Secondary | ICD-10-CM

## 2017-02-08 DIAGNOSIS — M79672 Pain in left foot: Principal | ICD-10-CM

## 2017-02-08 DIAGNOSIS — B351 Tinea unguium: Secondary | ICD-10-CM

## 2017-02-08 DIAGNOSIS — E1142 Type 2 diabetes mellitus with diabetic polyneuropathy: Secondary | ICD-10-CM

## 2017-02-08 DIAGNOSIS — M79675 Pain in left toe(s): Secondary | ICD-10-CM

## 2017-02-08 DIAGNOSIS — M79671 Pain in right foot: Secondary | ICD-10-CM

## 2017-02-08 MED ORDER — TRIAMCINOLONE ACETONIDE 10 MG/ML IJ SUSP
10.0000 mg | Freq: Once | INTRAMUSCULAR | Status: AC
  Administered 2017-02-08: 10 mg

## 2017-02-08 NOTE — Progress Notes (Signed)
Dg righ

## 2017-02-10 NOTE — Progress Notes (Signed)
Subjective:   Patient ID: Stacy LinPamela [name], female   DOB: 57 y.o.   MRN: [id number]   HPI Patient presents with painful nailbeds 1-5 both feet that she cannot cut and has high risk factors along with pain in the left arch which is occurred over the last month.  Patient does not remember specific injury   ROS      Objective:  Physical Exam  Patient is noted to have thickened yellow brittle nailbeds 1-5 both feet with inflammation pain in the plantar arch left with fluid buildup     Assessment:  Mycotic nail infection 1-5 both feet with inflammation of the plantar arch left     Plan:  H&P condition reviewed and today I reviewed x-ray of the left foot.  I injected the mid arch area 3 mg Kenalog 5 mg Xylocaine and debrided nailbeds 1-5 both feet with no iatrogenic bleeding and will return for routine care  X-ray indicates mild depression of the arch but no other indication of pathology

## 2017-02-17 ENCOUNTER — Ambulatory Visit: Payer: Medicaid Other | Attending: Internal Medicine | Admitting: Internal Medicine

## 2017-02-17 VITALS — BP 126/66 | HR 63 | Temp 98.1°F | Ht 66.0 in | Wt 169.8 lb

## 2017-02-17 DIAGNOSIS — Z79899 Other long term (current) drug therapy: Secondary | ICD-10-CM | POA: Insufficient documentation

## 2017-02-17 DIAGNOSIS — Z96651 Presence of right artificial knee joint: Secondary | ICD-10-CM | POA: Diagnosis not present

## 2017-02-17 DIAGNOSIS — Z7984 Long term (current) use of oral hypoglycemic drugs: Secondary | ICD-10-CM | POA: Insufficient documentation

## 2017-02-17 DIAGNOSIS — I10 Essential (primary) hypertension: Secondary | ICD-10-CM | POA: Insufficient documentation

## 2017-02-17 DIAGNOSIS — J4530 Mild persistent asthma, uncomplicated: Secondary | ICD-10-CM

## 2017-02-17 DIAGNOSIS — E119 Type 2 diabetes mellitus without complications: Secondary | ICD-10-CM

## 2017-02-17 DIAGNOSIS — H60503 Unspecified acute noninfective otitis externa, bilateral: Secondary | ICD-10-CM | POA: Insufficient documentation

## 2017-02-17 DIAGNOSIS — J019 Acute sinusitis, unspecified: Secondary | ICD-10-CM | POA: Diagnosis not present

## 2017-02-17 DIAGNOSIS — Z9889 Other specified postprocedural states: Secondary | ICD-10-CM | POA: Diagnosis not present

## 2017-02-17 DIAGNOSIS — Z9049 Acquired absence of other specified parts of digestive tract: Secondary | ICD-10-CM | POA: Insufficient documentation

## 2017-02-17 DIAGNOSIS — F17210 Nicotine dependence, cigarettes, uncomplicated: Secondary | ICD-10-CM | POA: Insufficient documentation

## 2017-02-17 DIAGNOSIS — H60393 Other infective otitis externa, bilateral: Secondary | ICD-10-CM

## 2017-02-17 DIAGNOSIS — K219 Gastro-esophageal reflux disease without esophagitis: Secondary | ICD-10-CM | POA: Insufficient documentation

## 2017-02-17 DIAGNOSIS — F17200 Nicotine dependence, unspecified, uncomplicated: Secondary | ICD-10-CM

## 2017-02-17 DIAGNOSIS — E1140 Type 2 diabetes mellitus with diabetic neuropathy, unspecified: Secondary | ICD-10-CM | POA: Diagnosis not present

## 2017-02-17 LAB — GLUCOSE, POCT (MANUAL RESULT ENTRY): POC Glucose: 95 mg/dl (ref 70–99)

## 2017-02-17 LAB — POCT GLYCOSYLATED HEMOGLOBIN (HGB A1C): Hemoglobin A1C: 6.9

## 2017-02-17 MED ORDER — NEOMYCIN-POLYMYXIN-HC 3.5-10000-1 OT SOLN: 4.0000 [drp] | Freq: Four times a day (QID) | OTIC | 0 refills | Status: DC

## 2017-02-17 MED ORDER — ACETAMINOPHEN-CODEINE #3 300-30 MG PO TABS: 1.0000 | ORAL_TABLET | ORAL | 0 refills | Status: DC | PRN

## 2017-02-17 MED ORDER — ONDANSETRON 4 MG PO TBDP: 4.0000 mg | ORAL_TABLET | Freq: Three times a day (TID) | ORAL | 0 refills | Status: DC | PRN

## 2017-02-17 MED ORDER — NICOTINE 14 MG/24HR TD PT24: 14.0000 mg | MEDICATED_PATCH | Freq: Every day | TRANSDERMAL | 3 refills | Status: DC

## 2017-02-17 MED ORDER — FLUTICASONE PROPIONATE 50 MCG/ACT NA SUSP: 2.0000 | Freq: Every day | NASAL | 6 refills | Status: DC

## 2017-02-17 MED ORDER — DOXYCYCLINE HYCLATE 100 MG PO TABS: 100.0000 mg | ORAL_TABLET | Freq: Two times a day (BID) | ORAL | 0 refills | Status: DC

## 2017-02-17 MED ORDER — ALBUTEROL SULFATE (2.5 MG/3ML) 0.083% IN NEBU: INHALATION_SOLUTION | RESPIRATORY_TRACT | 3 refills | Status: DC

## 2017-02-17 MED ORDER — BUDESONIDE-FORMOTEROL FUMARATE 80-4.5 MCG/ACT IN AERO: INHALATION_SPRAY | RESPIRATORY_TRACT | 0 refills | Status: DC

## 2017-02-17 NOTE — Progress Notes (Signed)
Subjective:  Patient ID: Stacy [name], female    DOB: September 04, 1960  Age: 57 y.o. MRN: [id number]  CC: Hospitalization Follow-up; Diabetes; and Cough   HPI Stacy [name] is a 57 y/o [place] American female with medical history of HTN, Asthma, COPD, Type II DM, HLD, and GERD. Patient presents for routine f/u for Type II DM and acute URI.  DM II: Patient taking medications as prescribed without complications. Blood glucose at home ranges from 80-100 mg/d. Patient is adherent to diabetic friendly diet. She is being seen by a nutritionist on 58/22/19 for further education. Denies polyuria, polyphagia, polydipsia. Denies paresthesia or generalized weakness. Denies changes in vision, aside from blurred vision in L eye related to recent conjunctivitis. She is scheduled to see Opthalmology 02/18/17 for follow-up. Last podiatry exam was 02/08/17.   URI: Patient recently seen in the Emergency Department, 01/24/17, for acute bacterial sinusitis. She was prescribed Augmentin; since completed antibiotic. Reports unresolved sinus tenderness, headache, cough with yellow sputum, otalgia with muffled hearing, nasal drainage, post-nasal drip, chills, and night sweats. Reports using her albuterol inhaler 2-3 times throughout the day and at night. Reports frequent coughing with induced vomiting x 2. Denies fevers, chest pain, or sob. Denies throat discomfort or changes in voice.   GERD: Reports increased nausea intermittently throughout the day. Denies changes in appetite. Requesting refill on Zofran.     Past Medical History:  Diagnosis Date  . Arthritis   . Arthrofibrosis of total knee replacement ([place])    right  . Asthma   . COPD (chronic obstructive pulmonary disease) ([place])   . Diabetes mellitus    Type II  . GERD (gastroesophageal reflux disease)    Pt on Protonix daily  . Glaucoma   . Gout   . Headache(784.0)    otc meds prn  . Hyperlipidemia   . Hypertension    Pt on lisinopril  . Irritable bowel  syndrome 11/19/2010  . Neuropathy   . Pneumonia   . Restless legs   . Shortness of breath    occasional - uses breathing tx at home   Past Surgical History:  Procedure Laterality Date  . CHOLECYSTECTOMY    . COLONOSCOPY    . ENDOMETRIAL ABLATION  10/2010  . HERNIA REPAIR     umbicial hernia  . KNEE CLOSED REDUCTION Right 12/06/2015   Procedure: CLOSED MANIPULATION RIGHT KNEE;  Surgeon: [name], MD;  Location: [facility];  Service: Orthopedics;  Laterality: Right;  . KNEE CLOSED REDUCTION Right 01/17/2016   Procedure: CLOSED MANIPULATION RIGHT KNEE;  Surgeon: [name], MD;  Location: [facility];  Service: Orthopedics;  Laterality: Right;  . KNEE JOINT MANIPULATION Right 12/06/2015  . LUMBAR DISC SURGERY  06/03/2015   L 2  L3 L4 L5   . LUMBAR LAMINECTOMY/DECOMPRESSION MICRODISCECTOMY N/A 06/03/2015   Procedure: Bilateral lateral recess decompression L2-3, L3-4, L4-5;  Surgeon: [name], MD;  Location: [facility];  Service: Orthopedics;  Laterality: N/A;  . LUMBAR LAMINECTOMY/DECOMPRESSION MICRODISCECTOMY N/A 10/02/2016   Procedure: Right L5-S1 Lateral Recess Decompression  microdiscectomy;  Surgeon: [name], MD;  Location: [facility];  Service: Orthopedics;  Laterality: N/A;  . svd      x 2  . TOTAL KNEE ARTHROPLASTY Right 09/06/2015   Procedure: RIGHT TOTAL KNEE ARTHROPLASTY;  Surgeon: [name], MD;  Location: [facility];  Service: Orthopedics;  Laterality: Right;  . TUBAL LIGATION    . UPPER GASTROINTESTINAL ENDOSCOPY  04/28/11   Social History  Socioeconomic History  . Marital status: Married    Spouse name: Not on file  . Number of children: 2  . Years of education: Not on file  . Highest education level: Not on file  Social Needs  . Financial resource strain: Not on file  . Food insecurity - worry: Not on file  . Food insecurity - inability: Not on file  . Transportation needs - medical: Not on file  . Transportation needs - non-medical: Not on file  Occupational History    . Occupation: unemployed    Fish farm manager: UNEMPLOYED  Tobacco Use  . Smoking status: Current Some Day Smoker    Packs/day: 0.25    Years: 32.00    Pack years: 8.00    Types: Cigarettes  . Smokeless tobacco: Never Used  Substance and Sexual Activity  . Alcohol use: No  . Drug use: No  . Sexual activity: Yes    Birth control/protection: Surgical  Other Topics Concern  . Not on file  Social History Narrative  . Not on file    Outpatient Medications Prior to Visit  Medication Sig Dispense Refill  . allopurinol (ZYLOPRIM) 100 MG tablet Take 1 tablet (100 mg total) by mouth daily. 90 tablet 3  . Blood Glucose Monitoring Suppl (ACCU-CHEK AVIVA PLUS) w/Device KIT 1 each by Does not apply route 3 (three) times daily. 1 kit 0  . cholecalciferol (VITAMIN D) 1000 units tablet Take 1,000 Units by mouth daily.    . diclofenac sodium (VOLTAREN) 1 % GEL Apply 4 g topically 4 (four) times daily. 3 Tube 1  . EPINEPHrine 0.3 mg/0.3 mL IJ SOAJ injection Inject 0.3 mLs (0.3 mg total) into the muscle once. (Patient taking differently: Inject 0.3 mg into the muscle daily as needed (for allergic reaction). ) 1 Device 1  . fluocinonide (LIDEX) 0.05 % external solution Apply 1 application topically 2 (two) times daily. 60 mL 0  . gabapentin (NEURONTIN) 300 MG capsule Take 1 capsule (300 mg total) by mouth 3 (three) times daily. 270 capsule 3  . glucose blood (TRUE METRIX BLOOD GLUCOSE TEST) test strip USE AS DIRECTED BY PHYSICIAN 100 each 12  . hydrOXYzine (ATARAX/VISTARIL) 10 MG tablet Take 1 tablet (10 mg total) by mouth 3 (three) times daily as needed for itching. 90 tablet 3  . Lancets (ACCU-CHEK SOFT TOUCH) lancets Use as instructed 100 each 12  . metFORMIN (GLUCOPHAGE-XR) 750 MG 24 hr tablet Take 1 tablet (750 mg total) by mouth daily with breakfast. 90 tablet 3  . methocarbamol (ROBAXIN) 500 MG tablet Take 1 tablet (500 mg total) by mouth every 6 (six) hours as needed for muscle spasms. 60 tablet 3  .  MITIGARE 0.6 MG CAPS Take 1 capsule by mouth daily. 30 capsule 3  . mometasone-formoterol (DULERA) 100-5 MCG/ACT AERO Inhale 2 puffs into the lungs 2 (two) times daily. 1 Inhaler 3  . Multiple Vitamin (MULTIVITAMIN WITH MINERALS) TABS tablet Take 1 tablet by mouth daily.    . pantoprazole (PROTONIX) 40 MG tablet Take 1 tablet (40 mg total) by mouth daily. 90 tablet 3  . potassium chloride SA (K-DUR,KLOR-CON) 20 MEQ tablet Take 1 tablet (20 mEq total) by mouth daily. 30 tablet 3  . simvastatin (ZOCOR) 20 MG tablet Take 1 tablet (20 mg total) by mouth at bedtime. 90 tablet 3  . sucralfate (CARAFATE) 1 g tablet Take 1 g by mouth 4 (four) times daily -  with meals and at bedtime.    . valsartan-hydrochlorothiazide (  DIOVAN-HCT) 160-12.5 MG tablet Take 1 tablet by mouth daily. 90 tablet 3  . acetaminophen-codeine (TYLENOL #3) 300-30 MG tablet Take 1 tablet by mouth every 4 (four) hours as needed. 40 tablet 0  . albuterol (PROVENTIL) (2.5 MG/3ML) 0.083% nebulizer solution USE 1 VIAL VIA NEBULIZER EVERY 4 HOURS AS NEEDED FOR WHEEZING 1650 mL 0  . ondansetron (ZOFRAN ODT) 4 MG disintegrating tablet Take 1 tablet (4 mg total) by mouth every 8 (eight) hours as needed for nausea or vomiting. 20 tablet 0  . SYMBICORT 80-4.5 MCG/ACT inhaler INHALE TWO PUFFS BY MOUTH TWICE A DAY 30.6 g 0  . hydrocortisone-pramoxine (ANALPRAM-HC) 2.5-1 % rectal cream Place 1 application rectally 2 (two) times daily. (Patient not taking: Reported on 02/17/2017) 30 g 0  . nicotine (NICODERM CQ - DOSED IN MG/24 HOURS) 14 mg/24hr patch Place 1 patch (14 mg total) onto the skin daily. (Patient not taking: Reported on 02/17/2017) 28 patch 3   No facility-administered medications prior to visit.    Allergies  Allergen Reactions  . Other Shortness Of Breath    UNSPECIFIED AGENTS Allergic to perfumes and cleaning products  . Ace Inhibitors Cough       . Aspirin Nausea Only   ROS Review of Systems  Constitutional: Positive for  chills. Negative for activity change, appetite change and fever.  HENT: Positive for congestion, ear pain, postnasal drip, rhinorrhea, sinus pressure and sinus pain. Negative for sore throat, tinnitus and voice change.   Eyes: Negative for pain, discharge, itching and visual disturbance.  Respiratory: Positive for cough. Negative for chest tightness, shortness of breath and wheezing.   Cardiovascular: Negative for chest pain.  Gastrointestinal: Positive for nausea and vomiting (intermittent). Negative for abdominal pain.  Endocrine: Negative for polydipsia, polyphagia and polyuria.  Neurological: Positive for headaches. Negative for dizziness, weakness and numbness.  All other systems reviewed and are negative.  Objective:  BP 126/66   Pulse 63   Temp 98.1 F (36.7 C) (Oral)   Ht 5' 6"  (1.676 m)   Wt 169 lb 12.8 oz (77 kg)   SpO2 99%   BMI 27.41 kg/m   BP/Weight 02/17/2017 01/24/2017 62/83/1517  Systolic BP 616 073 710  Diastolic BP 66 626 81  Wt. (Lbs) 169.8 174 -  BMI 27.41 28.08 -   Lab Results  Component Value Date   HGBA1C 6.9 02/17/2017   Lab Results  Component Value Date   POCGLU 95 02/17/2017   POCGLU 261 (A) 11/23/2016   POCGLU 90 08/19/2016   Physical Exam  Constitutional: She is oriented to person, place, and time. She appears well-developed and well-nourished. No distress.  HENT:  Head: Normocephalic and atraumatic.  Right Ear: Tympanic membrane is not erythematous, not retracted and not bulging. No decreased hearing is noted.  Left Ear: There is tenderness. Tympanic membrane is not erythematous, not retracted and not bulging. Decreased hearing is noted.  Nose: No mucosal edema or nasal deformity. Right sinus exhibits maxillary sinus tenderness. Left sinus exhibits maxillary sinus tenderness.  Mouth/Throat: Oropharynx is clear and moist and mucous membranes are normal.  Eyes: Conjunctivae and EOM are normal.  Neck: Normal range of motion. Neck supple.    Cardiovascular: S1 normal and S2 normal.  Pulses:      Radial pulses are 2+ on the right side, and 2+ on the left side.  Pulmonary/Chest: Effort normal and breath sounds normal. No respiratory distress. She has no wheezes. She exhibits no tenderness.  Abdominal: Soft. Bowel sounds are  normal. There is no tenderness.  Musculoskeletal: Normal range of motion.  Lymphadenopathy:    She has no cervical adenopathy.  Neurological: She is alert and oriented to person, place, and time.  Skin: Skin is warm and dry.  Psychiatric: She has a normal mood and affect. Her behavior is normal. Thought content normal.  Nursing note and vitals reviewed.  Assessment & Plan:   1. Type 2 diabetes mellitus without complication, without long-term current use of insulin (HCC), uncontrolled  - A1c 6.9 today and POC glucose 95 mg/dL.  - Education provided about at home glucose monitoring and diabetic diet.  - Continue follow-up with nutritionist.  - Follow-up in 3 months for DM management.  - POCT glucose (manual entry) - POCT glycosylated hemoglobin (Hb A1C)  2. Essential hypertension, controlled  - Continue medication regimen.  - Education regarding heart healthy diet, such as DASH diet.  - Continue follow-up with nutritionist.  - Follow-up in 3 months.  3. Tobacco use disorder - Provided education about smoking cessation. Will continue nicotine patch therapy. - nicotine (NICODERM CQ - DOSED IN MG/24 HOURS) 14 mg/24hr patch; Place 1 patch (14 mg total) onto the skin daily.  Dispense: 28 patch; Refill: 3  4. Other infective acute otitis externa of both ears - Prescribed cortisporin otic drops.  - Follow-up in 2 week if symptoms persist.   5. Acute sinusitis, recurrence not specified, unspecified location - Prescribed Doxycycline 100 mg for 10 days.  - Prescribed Flonase.  - Follow-up in 2 weeks if symptoms persist.  6. Asthma, chronic, mild persistent, uncomplicated - Continue medication regimen.   - Encourage smoking cessation. - albuterol (PROVENTIL) (2.5 MG/3ML) 0.083% nebulizer solution; USE 1 VIAL VIA NEBULIZER EVERY 4 HOURS AS NEEDED FOR WHEEZING  Dispense: 1650 mL; Refill: 3  7. Gastroesophageal reflux disease without esophagitis - Encouraged education with nutritionist about acid-reducing foods in diet.  - Continue medication regimen.  - ondansetron (ZOFRAN ODT) 4 MG disintegrating tablet; Take 1 tablet (4 mg total) by mouth every 8 (eight) hours as needed for nausea or vomiting.  Dispense: 20 tablet; Refill: 0  8. Status post right knee replacement - Continue pain medication regimen. - acetaminophen-codeine (TYLENOL #3) 300-30 MG tablet; Take 1 tablet by mouth every 4 (four) hours as needed.  Dispense: 60 tablet; Refill: 0  Meds ordered this encounter  Medications  . doxycycline (VIBRA-TABS) 100 MG tablet    Sig: Take 1 tablet (100 mg total) by mouth 2 (two) times daily.    Dispense:  20 tablet    Refill:  0  . fluticasone (FLONASE) 50 MCG/ACT nasal spray    Sig: Place 2 sprays into both nostrils daily.    Dispense:  16 g    Refill:  6  . albuterol (PROVENTIL) (2.5 MG/3ML) 0.083% nebulizer solution    Sig: USE 1 VIAL VIA NEBULIZER EVERY 4 HOURS AS NEEDED FOR WHEEZING    Dispense:  1650 mL    Refill:  3    **Patient requests 90 days supply**  . ondansetron (ZOFRAN ODT) 4 MG disintegrating tablet    Sig: Take 1 tablet (4 mg total) by mouth every 8 (eight) hours as needed for nausea or vomiting.    Dispense:  20 tablet    Refill:  0  . nicotine (NICODERM CQ - DOSED IN MG/24 HOURS) 14 mg/24hr patch    Sig: Place 1 patch (14 mg total) onto the skin daily.    Dispense:  28 patch  Refill:  3  . budesonide-formoterol (SYMBICORT) 80-4.5 MCG/ACT inhaler    Sig: INHALE TWO PUFFS BY MOUTH TWICE A DAY    Dispense:  30.6 g    Refill:  0  . neomycin-polymyxin-hydrocortisone (CORTISPORIN) OTIC solution    Sig: Place 4 drops into both ears 4 (four) times daily.    Dispense:   20 mL    Refill:  0  . acetaminophen-codeine (TYLENOL #3) 300-30 MG tablet    Sig: Take 1 tablet by mouth every 4 (four) hours as needed.    Dispense:  60 tablet    Refill:  0    Follow-up: Return in about 3 months (around 05/18/2017) for Hemoglobin A1C and Follow up, DM, Follow up HTN, Follow up Pain and comorbidities.   Krystle H. Hulan Fray, Student DNP   Evaluation and management procedures were performed by me with DNP Student in attendance, note written by DNP student under my supervision and collaboration. I have reviewed the note and I agree with the management and plan.  Angelica Chessman, MD, [place], [place], Karilyn Cota [facility] and [facility] [place], [place]   02/20/2017, 6:25 PM

## 2017-02-17 NOTE — Patient Instructions (Addendum)
Sinusitis, Adult Sinusitis is soreness and inflammation of your sinuses. Sinuses are hollow spaces in the bones around your face. Your sinuses are located:  Around your eyes.  In the middle of your forehead.  Behind your nose.  In your cheekbones.  Your sinuses and nasal passages are lined with a stringy fluid (mucus). Mucus normally drains out of your sinuses. When your nasal tissues become inflamed or swollen, the mucus can become trapped or blocked so air cannot flow through your sinuses. This allows bacteria, viruses, and funguses to grow, which leads to infection. Sinusitis can develop quickly and last for 7?10 days (acute) or for more than 12 weeks (chronic). Sinusitis often develops after a cold. What are the causes? This condition is caused by anything that creates swelling in the sinuses or stops mucus from draining, including:  Allergies.  Asthma.  Bacterial or viral infection.  Abnormally shaped bones between the nasal passages.  Nasal growths that contain mucus (nasal polyps).  Narrow sinus openings.  Pollutants, such as chemicals or irritants in the air.  A foreign object stuck in the nose.  A fungal infection. This is rare.  What increases the risk? The following factors may make you more likely to develop this condition:  Having allergies or asthma.  Having had a recent cold or respiratory tract infection.  Having structural deformities or blockages in your nose or sinuses.  Having a weak immune system.  Doing a lot of swimming or diving.  Overusing nasal sprays.  Smoking.  What are the signs or symptoms? The main symptoms of this condition are pain and a feeling of pressure around the affected sinuses. Other symptoms include:  Upper toothache.  Earache.  Headache.  Bad breath.  Decreased sense of smell and taste.  A cough that may get worse at night.  Fatigue.  Fever.  Thick drainage from your nose. The drainage is often green and  it may contain pus (purulent).  Stuffy nose or congestion.  Postnasal drip. This is when extra mucus collects in the throat or back of the nose.  Swelling and warmth over the affected sinuses.  Sore throat.  Sensitivity to light.  How is this diagnosed? This condition is diagnosed based on symptoms, a medical history, and a physical exam. To find out if your condition is acute or chronic, your health care provider may:  Look in your nose for signs of nasal polyps.  Tap over the affected sinus to check for signs of infection.  View the inside of your sinuses using an imaging device that has a light attached (endoscope).  If your health care provider suspects that you have chronic sinusitis, you may also:  Be tested for allergies.  Have a sample of mucus taken from your nose (nasal culture) and checked for bacteria.  Have a mucus sample examined to see if your sinusitis is related to an allergy.  If your sinusitis does not respond to treatment and it lasts longer than 8 weeks, you may have an MRI or CT scan to check your sinuses. These scans also help to determine how severe your infection is. In rare cases, a bone biopsy may be done to rule out more serious types of fungal sinus disease. How is this treated? Treatment for sinusitis depends on the cause and whether your condition is chronic or acute. If a virus is causing your sinusitis, your symptoms will go away on their own within 10 days. You may be given medicines to relieve your symptoms,   including:  Topical nasal decongestants. They shrink swollen nasal passages and let mucus drain from your sinuses.  Antihistamines. These drugs block inflammation that is triggered by allergies. This can help to ease swelling in your nose and sinuses.  Topical nasal corticosteroids. These are nasal sprays that ease inflammation and swelling in your nose and sinuses.  Nasal saline washes. These rinses can help to get rid of thick mucus in  your nose.  If your condition is caused by bacteria, you will be given an antibiotic medicine. If your condition is caused by a fungus, you will be given an antifungal medicine. Surgery may be needed to correct underlying conditions, such as narrow nasal passages. Surgery may also be needed to remove polyps. Follow these instructions at home: Medicines  Take, use, or apply over-the-counter and prescription medicines only as told by your health care provider. These may include nasal sprays.  If you were prescribed an antibiotic medicine, take it as told by your health care provider. Do not stop taking the antibiotic even if you start to feel better. Hydrate and Humidify  Drink enough water to keep your urine clear or pale yellow. Staying hydrated will help to thin your mucus.  Use a cool mist humidifier to keep the humidity level in your home above 50%.  Inhale steam for 10-15 minutes, 3-4 times a day or as told by your health care provider. You can do this in the bathroom while a hot shower is running.  Limit your exposure to cool or dry air. Rest  Rest as much as possible.  Sleep with your head raised (elevated).  Make sure to get enough sleep each night. General instructions  Apply a warm, moist washcloth to your face 3-4 times a day or as told by your health care provider. This will help with discomfort.  Wash your hands often with soap and water to reduce your exposure to viruses and other germs. If soap and water are not available, use hand sanitizer.  Do not smoke. Avoid being around people who are smoking (secondhand smoke).  Keep all follow-up visits as told by your health care provider. This is important. Contact a health care provider if:  You have a fever.  Your symptoms get worse.  Your symptoms do not improve within 10 days. Get help right away if:  You have a severe headache.  You have persistent vomiting.  You have pain or swelling around your face or  eyes.  You have vision problems.  You develop confusion.  Your neck is stiff.  You have trouble breathing. This information is not intended to replace advice given to you by your health care provider. Make sure you discuss any questions you have with your health care provider. Document Released: 01/19/2005 Document Revised: 09/15/2015 Document Reviewed: 11/14/2014 Elsevier Interactive Patient Education  2018 [employer]. Diabetes Mellitus and Nutrition When you have diabetes (diabetes mellitus), it is very important to have healthy eating habits because your blood sugar (glucose) levels are greatly affected by what you eat and drink. Eating healthy foods in the appropriate amounts, at about the same times every day, can help you:  Control your blood glucose.  Lower your risk of heart disease.  Improve your blood pressure.  Reach or maintain a healthy weight.  Every person with diabetes is different, and each person has different needs for a meal plan. Your health care provider may recommend that you work with a diet and nutrition specialist (dietitian) to make a meal  plan that is best for you. Your meal plan may vary depending on factors such as:  The calories you need.  The medicines you take.  Your weight.  Your blood glucose, blood pressure, and cholesterol levels.  Your activity level.  Other health conditions you have, such as heart or kidney disease.  How do carbohydrates affect me? Carbohydrates affect your blood glucose level more than any other type of food. Eating carbohydrates naturally increases the amount of glucose in your blood. Carbohydrate counting is a method for keeping track of how many carbohydrates you eat. Counting carbohydrates is important to keep your blood glucose at a healthy level, especially if you use insulin or take certain oral diabetes medicines. It is important to know how many carbohydrates you can safely have in each meal. This is  different for every person. Your dietitian can help you calculate how many carbohydrates you should have at each meal and for snack. Foods that contain carbohydrates include:  Bread, cereal, rice, pasta, and crackers.  Potatoes and corn.  Peas, beans, and lentils.  Milk and yogurt.  Fruit and juice.  Desserts, such as cakes, cookies, ice cream, and candy.  How does alcohol affect me? Alcohol can cause a sudden decrease in blood glucose (hypoglycemia), especially if you use insulin or take certain oral diabetes medicines. Hypoglycemia can be a life-threatening condition. Symptoms of hypoglycemia (sleepiness, dizziness, and confusion) are similar to symptoms of having too much alcohol. If your health care provider says that alcohol is safe for you, follow these guidelines:  Limit alcohol intake to no more than 1 drink per day for nonpregnant women and 2 drinks per day for men. One drink equals 12 oz of beer, 5 oz of wine, or 1 oz of hard liquor.  Do not drink on an empty stomach.  Keep yourself hydrated with water, diet soda, or unsweetened iced tea.  Keep in mind that regular soda, juice, and other mixers may contain a lot of sugar and must be counted as carbohydrates.  What are tips for following this plan? Reading food labels  Start by checking the serving size on the label. The amount of calories, carbohydrates, fats, and other nutrients listed on the label are based on one serving of the food. Many foods contain more than one serving per package.  Check the total grams (g) of carbohydrates in one serving. You can calculate the number of servings of carbohydrates in one serving by dividing the total carbohydrates by 15. For example, if a food has 30 g of total carbohydrates, it would be equal to 2 servings of carbohydrates.  Check the number of grams (g) of saturated and trans fats in one serving. Choose foods that have low or no amount of these fats.  Check the number of  milligrams (mg) of sodium in one serving. Most people should limit total sodium intake to less than 2,300 mg per day.  Always check the nutrition information of foods labeled as "low-fat" or "nonfat". These foods may be higher in added sugar or refined carbohydrates and should be avoided.  Talk to your dietitian to identify your daily goals for nutrients listed on the label. Shopping  Avoid buying canned, premade, or processed foods. These foods tend to be high in fat, sodium, and added sugar.  Shop around the outside edge of the grocery store. This includes fresh fruits and vegetables, bulk grains, fresh meats, and fresh dairy. Cooking  Use low-heat cooking methods, such as baking, instead of  high-heat cooking methods like deep frying.  Cook using healthy oils, such as olive, canola, or sunflower oil.  Avoid cooking with butter, cream, or high-fat meats. Meal planning  Eat meals and snacks regularly, preferably at the same times every day. Avoid going long periods of time without eating.  Eat foods high in fiber, such as fresh fruits, vegetables, beans, and whole grains. Talk to your dietitian about how many servings of carbohydrates you can eat at each meal.  Eat 4-6 ounces of lean protein each day, such as lean meat, chicken, fish, eggs, or tofu. 1 ounce is equal to 1 ounce of meat, chicken, or fish, 1 egg, or 1/4 cup of tofu.  Eat some foods each day that contain healthy fats, such as avocado, nuts, seeds, and fish. Lifestyle   Check your blood glucose regularly.  Exercise at least 30 minutes 5 or more days each week, or as told by your health care provider.  Take medicines as told by your health care provider.  Do not use any products that contain nicotine or tobacco, such as cigarettes and e-cigarettes. If you need help quitting, ask your health care provider.  Work with a Veterinary surgeon or diabetes educator to identify strategies to manage stress and any emotional and social  challenges. What are some questions to ask my health care provider?  Do I need to meet with a diabetes educator?  Do I need to meet with a dietitian?  What number can I call if I have questions?  When are the best times to check my blood glucose? Where to find more information:  American Diabetes Association: diabetes.org/food-and-fitness/food  Academy of Nutrition and Dietetics: https://www.vargas.com/  [employer] of Diabetes and Digestive and Kidney Diseases (NIH): FindJewelers.cz Summary  A healthy meal plan will help you control your blood glucose and maintain a healthy lifestyle.  Working with a diet and nutrition specialist (dietitian) can help you make a meal plan that is best for you.  Keep in mind that carbohydrates and alcohol have immediate effects on your blood glucose levels. It is important to count carbohydrates and to use alcohol carefully. This information is not intended to replace advice given to you by your health care provider. Make sure you discuss any questions you have with your health care provider. Document Released: 10/16/2004 Document Revised: 02/24/2016 Document Reviewed: 02/24/2016 Elsevier Interactive Patient Education  2018 [employer]. Diabetes and Foot Care Diabetes may cause you to have problems because of poor blood supply (circulation) to your feet and legs. This may cause the skin on your feet to become thinner, break easier, and heal more slowly. Your skin may become dry, and the skin may peel and crack. You may also have nerve damage in your legs and feet causing decreased feeling in them. You may not notice minor injuries to your feet that could lead to infections or more serious problems. Taking care of your feet is one of the most important things you can do for yourself. Follow these instructions at home:  Wear shoes at  all times, even in the house. Do not go barefoot. Bare feet are easily injured.  Check your feet daily for blisters, cuts, and redness. If you cannot see the bottom of your feet, use a mirror or ask someone for help.  Wash your feet with warm water (do not use hot water) and mild soap. Then pat your feet and the areas between your toes until they are completely dry. Do not soak your feet  as this can dry your skin.  Apply a moisturizing lotion or petroleum jelly (that does not contain alcohol and is unscented) to the skin on your feet and to dry, brittle toenails. Do not apply lotion between your toes.  Trim your toenails straight across. Do not dig under them or around the cuticle. File the edges of your nails with an emery board or nail file.  Do not cut corns or calluses or try to remove them with medicine.  Wear clean socks or stockings every day. Make sure they are not too tight. Do not wear knee-high stockings since they may decrease blood flow to your legs.  Wear shoes that fit properly and have enough cushioning. To break in new shoes, wear them for just a few hours a day. This prevents you from injuring your feet. Always look in your shoes before you put them on to be sure there are no objects inside.  Do not cross your legs. This may decrease the blood flow to your feet.  If you find a minor scrape, cut, or break in the skin on your feet, keep it and the skin around it clean and dry. These areas may be cleansed with mild soap and water. Do not cleanse the area with peroxide, alcohol, or iodine.  When you remove an adhesive bandage, be sure not to damage the skin around it.  If you have a wound, look at it several times a day to make sure it is healing.  Do not use heating pads or hot water bottles. They may burn your skin. If you have lost feeling in your feet or legs, you may not know it is happening until it is too late.  Make sure your health care provider performs a complete  foot exam at least annually or more often if you have foot problems. Report any cuts, sores, or bruises to your health care provider immediately. Contact a health care provider if:  You have an injury that is not healing.  You have cuts or breaks in the skin.  You have an ingrown nail.  You notice redness on your legs or feet.  You feel burning or tingling in your legs or feet.  You have pain or cramps in your legs and feet.  Your legs or feet are numb.  Your feet always feel cold. Get help right away if:  There is increasing redness, swelling, or pain in or around a wound.  There is a red line that goes up your leg.  Pus is coming from a wound.  You develop a fever or as directed by your health care provider.  You notice a bad smell coming from an ulcer or wound. This information is not intended to replace advice given to you by your health care provider. Make sure you discuss any questions you have with your health care provider. Document Released: 01/17/2000 Document Revised: 06/27/2015 Document Reviewed: 06/28/2012 Elsevier Interactive Patient Education  2017 [employer]. Diabetes Mellitus and Exercise Exercising regularly is important for your overall health, especially when you have diabetes (diabetes mellitus). Exercising is not only about losing weight. It has many health benefits, such as increasing muscle strength and bone density and reducing body fat and stress. This leads to improved fitness, flexibility, and endurance, all of which result in better overall health. Exercise has additional benefits for people with diabetes, including:  Reducing appetite.  Helping to lower and control blood glucose.  Lowering blood pressure.  Helping to control amounts  of fatty substances (lipids) in the blood, such as cholesterol and triglycerides.  Helping the body to respond better to insulin (improving insulin sensitivity).  Reducing how much insulin the body  needs.  Decreasing the risk for heart disease by: ? Lowering cholesterol and triglyceride levels. ? Increasing the levels of good cholesterol. ? Lowering blood glucose levels.  What is my activity plan? Your health care provider or certified diabetes educator can help you make a plan for the type and frequency of exercise (activity plan) that works for you. Make sure that you:  Do at least 150 minutes of moderate-intensity or vigorous-intensity exercise each week. This could be brisk walking, biking, or water aerobics. ? Do stretching and strength exercises, such as yoga or weightlifting, at least 2 times a week. ? Spread out your activity over at least 3 days of the week.  Get some form of physical activity every day. ? Do not go more than 2 days in a row without some kind of physical activity. ? Avoid being inactive for more than 90 minutes at a time. Take frequent breaks to walk or stretch.  Choose a type of exercise or activity that you enjoy, and set realistic goals.  Start slowly, and gradually increase the intensity of your exercise over time.  What do I need to know about managing my diabetes?  Check your blood glucose before and after exercising. ? If your blood glucose is higher than 240 mg/dL (40.9 mmol/L) before you exercise, check your urine for ketones. If you have ketones in your urine, do not exercise until your blood glucose returns to normal.  Know the symptoms of low blood glucose (hypoglycemia) and how to treat it. Your risk for hypoglycemia increases during and after exercise. Common symptoms of hypoglycemia can include: ? Hunger. ? Anxiety. ? Sweating and feeling clammy. ? Confusion. ? Dizziness or feeling light-headed. ? Increased heart rate or palpitations. ? Blurry vision. ? Tingling or numbness around the mouth, lips, or tongue. ? Tremors or shakes. ? Irritability.  Keep a rapid-acting carbohydrate snack available before, during, and after exercise to  help prevent or treat hypoglycemia.  Avoid injecting insulin into areas of the body that are going to be exercised. For example, avoid injecting insulin into: ? The arms, when playing tennis. ? The legs, when jogging.  Keep records of your exercise habits. Doing this can help you and your health care provider adjust your diabetes management plan as needed. Write down: ? Food that you eat before and after you exercise. ? Blood glucose levels before and after you exercise. ? The type and amount of exercise you have done. ? When your insulin is expected to peak, if you use insulin. Avoid exercising at times when your insulin is peaking.  When you start a new exercise or activity, work with your health care provider to make sure the activity is safe for you, and to adjust your insulin, medicines, or food intake as needed.  Drink plenty of water while you exercise to prevent dehydration or heat stroke. Drink enough fluid to keep your urine clear or pale yellow. This information is not intended to replace advice given to you by your health care provider. Make sure you discuss any questions you have with your health care provider. Document Released: 04/11/2003 Document Revised: 08/09/2015 Document Reviewed: 07/01/2015 Elsevier Interactive Patient Education  2018 [employer].

## 2017-02-18 ENCOUNTER — Other Ambulatory Visit: Payer: Self-pay | Admitting: Internal Medicine

## 2017-02-18 ENCOUNTER — Ambulatory Visit: Payer: Medicaid Other

## 2017-02-18 DIAGNOSIS — J441 Chronic obstructive pulmonary disease with (acute) exacerbation: Secondary | ICD-10-CM

## 2017-02-24 ENCOUNTER — Ambulatory Visit: Payer: Medicaid Other | Admitting: Internal Medicine

## 2017-03-03 ENCOUNTER — Other Ambulatory Visit: Payer: Self-pay

## 2017-03-03 ENCOUNTER — Ambulatory Visit: Payer: Medicaid Other | Attending: Internal Medicine | Admitting: Internal Medicine

## 2017-03-03 VITALS — BP 107/69 | HR 69 | Temp 98.1°F | Resp 12 | Wt 167.2 lb

## 2017-03-03 DIAGNOSIS — Z9049 Acquired absence of other specified parts of digestive tract: Secondary | ICD-10-CM | POA: Insufficient documentation

## 2017-03-03 DIAGNOSIS — Z7951 Long term (current) use of inhaled steroids: Secondary | ICD-10-CM | POA: Insufficient documentation

## 2017-03-03 DIAGNOSIS — E119 Type 2 diabetes mellitus without complications: Secondary | ICD-10-CM | POA: Diagnosis not present

## 2017-03-03 DIAGNOSIS — K219 Gastro-esophageal reflux disease without esophagitis: Secondary | ICD-10-CM | POA: Diagnosis not present

## 2017-03-03 DIAGNOSIS — M109 Gout, unspecified: Secondary | ICD-10-CM | POA: Diagnosis not present

## 2017-03-03 DIAGNOSIS — I10 Essential (primary) hypertension: Secondary | ICD-10-CM | POA: Diagnosis not present

## 2017-03-03 DIAGNOSIS — Z96651 Presence of right artificial knee joint: Secondary | ICD-10-CM | POA: Insufficient documentation

## 2017-03-03 DIAGNOSIS — H42 Glaucoma in diseases classified elsewhere: Secondary | ICD-10-CM | POA: Insufficient documentation

## 2017-03-03 DIAGNOSIS — G2581 Restless legs syndrome: Secondary | ICD-10-CM | POA: Diagnosis not present

## 2017-03-03 DIAGNOSIS — E785 Hyperlipidemia, unspecified: Secondary | ICD-10-CM | POA: Insufficient documentation

## 2017-03-03 DIAGNOSIS — K589 Irritable bowel syndrome without diarrhea: Secondary | ICD-10-CM | POA: Diagnosis not present

## 2017-03-03 DIAGNOSIS — Z7984 Long term (current) use of oral hypoglycemic drugs: Secondary | ICD-10-CM | POA: Diagnosis not present

## 2017-03-03 DIAGNOSIS — Z886 Allergy status to analgesic agent status: Secondary | ICD-10-CM | POA: Diagnosis not present

## 2017-03-03 DIAGNOSIS — E1139 Type 2 diabetes mellitus with other diabetic ophthalmic complication: Secondary | ICD-10-CM | POA: Diagnosis not present

## 2017-03-03 DIAGNOSIS — J449 Chronic obstructive pulmonary disease, unspecified: Secondary | ICD-10-CM | POA: Diagnosis not present

## 2017-03-03 DIAGNOSIS — E1140 Type 2 diabetes mellitus with diabetic neuropathy, unspecified: Secondary | ICD-10-CM | POA: Diagnosis not present

## 2017-03-03 DIAGNOSIS — J019 Acute sinusitis, unspecified: Secondary | ICD-10-CM | POA: Insufficient documentation

## 2017-03-03 DIAGNOSIS — Z79899 Other long term (current) drug therapy: Secondary | ICD-10-CM | POA: Insufficient documentation

## 2017-03-03 DIAGNOSIS — H9193 Unspecified hearing loss, bilateral: Secondary | ICD-10-CM | POA: Diagnosis not present

## 2017-03-03 LAB — POCT CBG (FASTING - GLUCOSE)-MANUAL ENTRY: Glucose Fasting, POC: 102 mg/dL — AB (ref 70–99)

## 2017-03-03 LAB — POCT GLYCOSYLATED HEMOGLOBIN (HGB A1C): Hemoglobin A1C: 6.6

## 2017-03-03 MED ORDER — PREDNISONE 20 MG PO TABS: 20.0000 mg | ORAL_TABLET | Freq: Every day | ORAL | 0 refills | Status: AC

## 2017-03-03 MED ORDER — DM-GUAIFENESIN ER 30-600 MG PO TB12: 1.0000 | ORAL_TABLET | Freq: Two times a day (BID) | ORAL | 0 refills | Status: DC

## 2017-03-03 MED ORDER — PREDNISONE 20 MG PO TABS: 20.0000 mg | ORAL_TABLET | Freq: Every day | ORAL | 0 refills | Status: DC

## 2017-03-03 MED ORDER — DM-GUAIFENESIN ER 30-600 MG PO TB12
1.0000 | ORAL_TABLET | Freq: Two times a day (BID) | ORAL | 0 refills | Status: AC
Start: 2017-03-03 — End: 2017-03-13

## 2017-03-03 MED ORDER — LEVOFLOXACIN 750 MG PO TABS: 750.0000 mg | ORAL_TABLET | Freq: Every day | ORAL | 0 refills | Status: AC

## 2017-03-03 MED ORDER — LEVOFLOXACIN 750 MG PO TABS: 750.0000 mg | ORAL_TABLET | Freq: Every day | ORAL | 0 refills | Status: DC

## 2017-03-03 NOTE — Patient Instructions (Signed)

## 2017-03-03 NOTE — Progress Notes (Signed)
Subjective:  Patient ID: Stacy [name], female    DOB: 1960/11/30  Age: 57 y.o. MRN: [id number]  CC: No chief complaint on file.  HPI Stacy [name] is a 57 year old female that presents today with c/o of persistent Sinusititis.  Patient went to ED 12/18 for acute bacterial sinusitis. She was discharged with  Augmentin and completed the course. She was seen 02/17/17 at the clinic with unresolved symptoms and was given Doxycycline, Flonase, and ear antibiotic ear drops. Today she continues to have sinus tenderness, cough, yellow sputum production, earache with muffled hearing, and hot flashes. Her voice comes and goes and she attributes this to the cold weather. She still expereinces nausea, although the zofran helps. Denies fevers, chest pain, or sob.   Past Medical History:  Diagnosis Date  . Arthritis   . Arthrofibrosis of total knee replacement ([place])    right  . Asthma   . COPD (chronic obstructive pulmonary disease) ([place])   . Diabetes mellitus    Type II  . GERD (gastroesophageal reflux disease)    Pt on Protonix daily  . Glaucoma   . Gout   . Headache(784.0)    otc meds prn  . Hyperlipidemia   . Hypertension    Pt on lisinopril  . Irritable bowel syndrome 11/19/2010  . Neuropathy   . Pneumonia   . Restless legs   . Shortness of breath    occasional - uses breathing tx at home   Past Surgical History:  Procedure Laterality Date  . CHOLECYSTECTOMY    . COLONOSCOPY    . ENDOMETRIAL ABLATION  10/2010  . HERNIA REPAIR     umbicial hernia  . KNEE CLOSED REDUCTION Right 12/06/2015   Procedure: CLOSED MANIPULATION RIGHT KNEE;  Surgeon: [name], MD;  Location: [facility];  Service: Orthopedics;  Laterality: Right;  . KNEE CLOSED REDUCTION Right 01/17/2016   Procedure: CLOSED MANIPULATION RIGHT KNEE;  Surgeon: [name], MD;  Location: [facility];  Service: Orthopedics;  Laterality: Right;  . KNEE JOINT MANIPULATION Right 12/06/2015  . LUMBAR DISC SURGERY  06/03/2015   L 2   L3 L4 L5   . LUMBAR LAMINECTOMY/DECOMPRESSION MICRODISCECTOMY N/A 06/03/2015   Procedure: Bilateral lateral recess decompression L2-3, L3-4, L4-5;  Surgeon: [name], MD;  Location: [facility];  Service: Orthopedics;  Laterality: N/A;  . LUMBAR LAMINECTOMY/DECOMPRESSION MICRODISCECTOMY N/A 10/02/2016   Procedure: Right L5-S1 Lateral Recess Decompression  microdiscectomy;  Surgeon: [name], MD;  Location: [facility];  Service: Orthopedics;  Laterality: N/A;  . svd      x 2  . TOTAL KNEE ARTHROPLASTY Right 09/06/2015   Procedure: RIGHT TOTAL KNEE ARTHROPLASTY;  Surgeon: [name], MD;  Location: [facility];  Service: Orthopedics;  Laterality: Right;  . TUBAL LIGATION    . UPPER GASTROINTESTINAL ENDOSCOPY  04/28/11   Allergies  Allergen Reactions  . Other Shortness Of Breath    UNSPECIFIED AGENTS Allergic to perfumes and cleaning products  . Ace Inhibitors Cough       . Aspirin Nausea Only    Outpatient Medications Prior to Visit  Medication Sig Dispense Refill  . acetaminophen-codeine (TYLENOL #3) 300-30 MG tablet Take 1 tablet by mouth every 4 (four) hours as needed. 60 tablet 0  . albuterol (PROVENTIL) (2.5 MG/3ML) 0.083% nebulizer solution USE 1 VIAL VIA NEBULIZER EVERY 4 HOURS AS NEEDED FOR WHEEZING 1650 mL 3  . allopurinol (ZYLOPRIM) 100 MG tablet Take 1 tablet (100 mg total)  by mouth daily. 90 tablet 3  . budesonide-formoterol (SYMBICORT) 80-4.5 MCG/ACT inhaler INHALE TWO PUFFS BY MOUTH TWICE A DAY 30.6 g 0  . cholecalciferol (VITAMIN D) 1000 units tablet Take 1,000 Units by mouth daily.    Marland Kitchen EPINEPHrine 0.3 mg/0.3 mL IJ SOAJ injection Inject 0.3 mLs (0.3 mg total) into the muscle once. (Patient taking differently: Inject 0.3 mg into the muscle daily as needed (for allergic reaction). ) 1 Device 1  . fluticasone (FLONASE) 50 MCG/ACT nasal spray Place 2 sprays into both nostrils daily. 16 g 6  . gabapentin (NEURONTIN) 300 MG capsule Take 1 capsule (300 mg total) by mouth 3 (three) times  daily. 270 capsule 3  . hydrOXYzine (ATARAX/VISTARIL) 10 MG tablet Take 1 tablet (10 mg total) by mouth 3 (three) times daily as needed for itching. 90 tablet 3  . metFORMIN (GLUCOPHAGE-XR) 750 MG 24 hr tablet Take 1 tablet (750 mg total) by mouth daily with breakfast. 90 tablet 3  . methocarbamol (ROBAXIN) 500 MG tablet Take 1 tablet (500 mg total) by mouth every 6 (six) hours as needed for muscle spasms. 60 tablet 3  . MITIGARE 0.6 MG CAPS Take 1 capsule by mouth daily. 30 capsule 3  . mometasone-formoterol (DULERA) 100-5 MCG/ACT AERO Inhale 2 puffs into the lungs 2 (two) times daily. 1 Inhaler 3  . Multiple Vitamin (MULTIVITAMIN WITH MINERALS) TABS tablet Take 1 tablet by mouth daily.    Marland Kitchen neomycin-polymyxin-hydrocortisone (CORTISPORIN) OTIC solution Place 4 drops into both ears 4 (four) times daily. 20 mL 0  . nicotine (NICODERM CQ - DOSED IN MG/24 HOURS) 14 mg/24hr patch Place 1 patch (14 mg total) onto the skin daily. 28 patch 3  . ondansetron (ZOFRAN ODT) 4 MG disintegrating tablet Take 1 tablet (4 mg total) by mouth every 8 (eight) hours as needed for nausea or vomiting. 20 tablet 0  . pantoprazole (PROTONIX) 40 MG tablet Take 1 tablet (40 mg total) by mouth daily. 90 tablet 3  . simvastatin (ZOCOR) 20 MG tablet Take 1 tablet (20 mg total) by mouth at bedtime. 90 tablet 3  . sucralfate (CARAFATE) 1 g tablet Take 1 g by mouth 4 (four) times daily -  with meals and at bedtime.    . valsartan-hydrochlorothiazide (DIOVAN-HCT) 160-12.5 MG tablet Take 1 tablet by mouth daily. 90 tablet 3  . Blood Glucose Monitoring Suppl (ACCU-CHEK AVIVA PLUS) w/Device KIT 1 each by Does not apply route 3 (three) times daily. 1 kit 0  . diclofenac sodium (VOLTAREN) 1 % GEL Apply 4 g topically 4 (four) times daily. (Patient not taking: Reported on 03/03/2017) 3 Tube 1  . doxycycline (VIBRA-TABS) 100 MG tablet Take 1 tablet (100 mg total) by mouth 2 (two) times daily. (Patient not taking: Reported on 03/03/2017) 20  tablet 0  . fluocinonide (LIDEX) 0.05 % external solution Apply 1 application topically 2 (two) times daily. 60 mL 0  . glucose blood (TRUE METRIX BLOOD GLUCOSE TEST) test strip USE AS DIRECTED BY PHYSICIAN 100 each 12  . hydrocortisone-pramoxine (ANALPRAM-HC) 2.5-1 % rectal cream Place 1 application rectally 2 (two) times daily. (Patient not taking: Reported on 02/17/2017) 30 g 0  . Lancets (ACCU-CHEK SOFT TOUCH) lancets Use as instructed 100 each 12  . potassium chloride SA (K-DUR,KLOR-CON) 20 MEQ tablet Take 1 tablet (20 mEq total) by mouth daily. (Patient not taking: Reported on 03/03/2017) 30 tablet 3   No facility-administered medications prior to visit.     ROS Review of Systems  Constitutional: Negative for fever.       Hot flashes  HENT: Positive for congestion, ear pain, hearing loss, sinus pressure, sinus pain and sore throat.   Eyes: Negative for discharge.  Respiratory: Positive for cough. Negative for shortness of breath.   Cardiovascular: Negative for chest pain.  Gastrointestinal: Positive for nausea.    Objective:  BP 107/69   Pulse 69   Temp 98.1 F (36.7 C)   Resp 12   Wt 167 lb 3.2 oz (75.8 kg)   SpO2 99%   BMI 26.99 kg/m   BP/Weight 03/03/2017 02/17/2017 40/98/1191  Systolic BP 478 295 621  Diastolic BP 69 66 308  Wt. (Lbs) 167.2 169.8 174  BMI 26.99 27.41 28.08    Physical Exam  Constitutional: She is oriented to person, place, and time. She appears well-developed. No distress.  HENT:  Head: Normocephalic.  Right Ear: There is tenderness. A middle ear effusion is present. No decreased hearing is noted.  Left Ear: There is tenderness. A middle ear effusion is present. No decreased hearing is noted.  Mouth/Throat: Oropharynx is clear and moist. No oropharyngeal exudate.  Eyes: Pupils are equal, round, and reactive to light.  Neck: Normal range of motion. Neck supple.  Cardiovascular: Normal rate, regular rhythm and normal heart sounds.    Pulmonary/Chest: Effort normal and breath sounds normal. She has no wheezes.  Abdominal: Soft. Bowel sounds are normal.  Lymphadenopathy:    She has no cervical adenopathy.  Neurological: She is alert and oriented to person, place, and time.  Skin: Skin is warm and dry.  Vitals reviewed.    Assessment & Plan:   1. Type 2 diabetes mellitus without complication, without long-term current use of insulin (HCC) Controlled with AIC of  6.6 Continue current medications - HgB A1c - Glucose (CBG), Fasting  2. Essential hypertension Controlled at this time. Continue medication regimen  3. Acute sinusitis, recurrence not specified, unspecified location Patient had already been treated with both Doxy and Augemetin. Will try Levaquin and refer to ENT for subacute sinusitis. - levofloxacin (LEVAQUIN) 750 MG tablet; Take 1 tablet (750 mg total) by mouth daily for 10 days.  Dispense: 10 tablet; Refill: 0 - Ambulatory referral to ENT - predniSONE (DELTASONE) 20 MG tablet; Take 1 tablet (20 mg total) by mouth daily with breakfast for 5 days.  Dispense: 5 tablet; Refill: 0  4. Hearing difficulty of both ears  dextromethorphan-guaiFENesin (MUCINEX DM) 30-600 MG 12hr tablet; Take 1 tablet by mouth 2 (two) times daily for 10 days.  Dispense: 20 tablet; Refill: 0  Meds ordered this encounter  Medications  . levofloxacin (LEVAQUIN) 750 MG tablet    Sig: Take 1 tablet (750 mg total) by mouth daily for 10 days.    Dispense:  10 tablet    Refill:  0  . dextromethorphan-guaiFENesin (MUCINEX DM) 30-600 MG 12hr tablet    Sig: Take 1 tablet by mouth 2 (two) times daily for 10 days.    Dispense:  20 tablet    Refill:  0  . predniSONE (DELTASONE) 20 MG tablet    Sig: Take 1 tablet (20 mg total) by mouth daily with breakfast for 5 days.    Dispense:  5 tablet    Refill:  0    Follow-up: Return in about 4 weeks (around 03/31/2017), or if symptoms worsen or fail to improve.   Fabian November DNP  Student  Evaluation and management procedures were performed by me with DNP Student in  attendance, note written by DNP student under my supervision and collaboration. I have reviewed the note and I agree with the management and plan.   Angelica Chessman, MD, MHA, [place], [facility]arilyn C[facility] and [place], [place]   03/11/2017, 3:57 PM

## 2017-03-03 NOTE — Progress Notes (Signed)
Sinus/ head cold Hearing diffifulty, ear ache. Sx's onset around 01/24/2017

## 2017-03-03 NOTE — Progress Notes (Signed)
 Stacy [name], is a 56 y.o. female  CSN:[id number]  MRN:[id number]  DOB - 04/09/1960  No chief complaint on file.      Subjective:   Stacy [name] is a 56 y.o. female here today for a follow up visit. Patient has No headache, No chest pain, No abdominal pain - No Nausea, No new weakness tingling or numbness, No Cough - SOB.  Problem  Acute Sinusitis  Hearing Difficulty of Both Ears    ALLERGIES: Allergies  Allergen Reactions  . Other Shortness Of Breath    UNSPECIFIED AGENTS Allergic to perfumes and cleaning products  . Ace Inhibitors Cough       . Aspirin Nausea Only    PAST MEDICAL HISTORY: Past Medical History:  Diagnosis Date  . Arthritis   . 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 Asthma   . COPD (chronic obstructive pulmonary disease) (HCC)   . Diabetes mellitus    Type II  . GERD (gastroesophageal reflux disease)    Pt on Protonix daily  . Glaucoma   . Gout   . Headache(784.0)    otc meds prn  . Hyperlipidemia   . Hypertension    Pt on lisinopril  . Irritable bowel syndrome 11/19/2010  . Neuropathy   . Pneumonia   . Restless legs   . Shortness of breath    occasional - uses breathing tx at home    MEDICATIONS AT HOME: Prior to Admission medications   Medication Sig Start Date End Date Taking? Authorizing Provider  acetaminophen-codeine (TYLENOL #3) 300-30 MG tablet Take 1 tablet by mouth every 4 (four) hours as needed. 02/17/17  Yes Jegede, Olugbemiga E, MD  albuterol (PROVENTIL) (2.5 MG/3ML) 0.083% nebulizer solution USE 1 VIAL VIA NEBULIZER EVERY 4 HOURS AS NEEDED FOR WHEEZING 02/17/17  Yes Jegede, Olugbemiga E, MD  allopurinol (ZYLOPRIM) 100 MG tablet Take 1 tablet (100 mg total) by mouth daily. 11/23/16  Yes Jegede, Olugbemiga E, MD  budesonide-formoterol (SYMBICORT) 80-4.5 MCG/ACT inhaler INHALE TWO PUFFS BY MOUTH TWICE A DAY 02/17/17  Yes Jegede, Olugbemiga E, MD  cholecalciferol (VITAMIN D) 1000 units tablet Take 1,000  Units by mouth daily.   Yes [provider]  EPINEPHrine 0.3 mg/0.3 mL IJ SOAJ injection Inject 0.3 mLs (0.3 mg total) into the muscle once. Patient taking differently: Inject 0.3 mg into the muscle daily as needed (for allergic reaction).  09/10/14  Yes Jegede, Olugbemiga E, MD  fluticasone (FLONASE) 50 MCG/ACT nasal spray Place 2 sprays into both nostrils daily. 02/17/17  Yes Jegede, Olugbemiga E, MD  gabapentin (NEURONTIN) 300 MG capsule Take 1 capsule (300 mg total) by mouth 3 (three) times daily. 11/23/16  Yes Jegede, Olugbemiga E, MD  hydrOXYzine (ATARAX/VISTARIL) 10 MG tablet Take 1 tablet (10 mg total) by mouth 3 (three) times daily as needed for itching. 11/23/16  Yes Jegede, Olugbemiga E, MD  metFORMIN (GLUCOPHAGE-XR) 750 MG 24 hr tablet Take 1 tablet (750 mg total) by mouth daily with breakfast. 11/23/16  Yes Jegede, Olugbemiga E, MD  methocarbamol (ROBAXIN) 500 MG tablet Take 1 tablet (500 mg total) by mouth every 6 (six) hours as needed for muscle spasms. 11/23/16  Yes Jegede, Olugbemiga E, MD  MITIGARE 0.6 MG CAPS Take 1 capsule by mouth daily. 01/20/17  Yes Jegede, Olugbemiga E, MD  mometasone-formoterol (DULERA) 100-5 MCG/ACT AERO Inhale 2 puffs into the lungs 2 (two) times daily. 06/10/16  Yes Jegede, Olugbemiga E, MD  Multiple Vitamin (MULTIVITAMIN WITH MINERALS)   TABS tablet Take 1 tablet by mouth daily.   Yes [provider]  neomycin-polymyxin-hydrocortisone (CORTISPORIN) OTIC solution Place 4 drops into both ears 4 (four) times daily. 02/17/17  Yes Jegede, Olugbemiga E, MD  nicotine (NICODERM CQ - DOSED IN MG/24 HOURS) 14 mg/24hr patch Place 1 patch (14 mg total) onto the skin daily. 02/17/17  Yes Jegede, Olugbemiga E, MD  ondansetron (ZOFRAN ODT) 4 MG disintegrating tablet Take 1 tablet (4 mg total) by mouth every 8 (eight) hours as needed for nausea or vomiting. 02/17/17  Yes Jegede, Olugbemiga E, MD  pantoprazole (PROTONIX) 40 MG tablet Take 1 tablet (40 mg total) by  mouth daily. 11/23/16  Yes Jegede, Olugbemiga E, MD  simvastatin (ZOCOR) 20 MG tablet Take 1 tablet (20 mg total) by mouth at bedtime. 11/23/16  Yes Jegede, Olugbemiga E, MD  sucralfate (CARAFATE) 1 g tablet Take 1 g by mouth 4 (four) times daily -  with meals and at bedtime.   Yes [provider]  valsartan-hydrochlorothiazide (DIOVAN-HCT) 160-12.5 MG tablet Take 1 tablet by mouth daily. 11/23/16  Yes Jegede, Olugbemiga E, MD  Blood Glucose Monitoring Suppl (ACCU-CHEK AVIVA PLUS) w/Device KIT 1 each by Does not apply route 3 (three) times daily. 06/10/16   Jegede, Olugbemiga E, MD  dextromethorphan-guaiFENesin (MUCINEX DM) 30-600 MG 12hr tablet Take 1 tablet by mouth 2 (two) times daily for 10 days. 03/03/17 03/13/17  Jegede, Olugbemiga E, MD  diclofenac sodium (VOLTAREN) 1 % GEL Apply 4 g topically 4 (four) times daily. Patient not taking: Reported on 03/03/2017 11/26/16   Nitka, James E, MD  doxycycline (VIBRA-TABS) 100 MG tablet Take 1 tablet (100 mg total) by mouth 2 (two) times daily. Patient not taking: Reported on 03/03/2017 02/17/17   Jegede, Olugbemiga E, MD  fluocinonide (LIDEX) 0.05 % external solution Apply 1 application topically 2 (two) times daily. 10/09/16   Eniola, Kehinde T, MD  glucose blood (TRUE METRIX BLOOD GLUCOSE TEST) test strip USE AS DIRECTED BY PHYSICIAN 11/23/16   Jegede, Olugbemiga E, MD  hydrocortisone-pramoxine (ANALPRAM-HC) 2.5-1 % rectal cream Place 1 application rectally 2 (two) times daily. Patient not taking: Reported on 02/17/2017 11/16/14   Newlin, Enobong, MD  Lancets (ACCU-CHEK SOFT TOUCH) lancets Use as instructed 11/23/16   Jegede, Olugbemiga E, MD  levofloxacin (LEVAQUIN) 750 MG tablet Take 1 tablet (750 mg total) by mouth daily for 10 days. 03/03/17 03/13/17  Jegede, Olugbemiga E, MD  predniSONE (DELTASONE) 20 MG tablet Take 1 tablet (20 mg total) by mouth daily with breakfast for 5 days. 03/03/17 03/08/17  Jegede, Olugbemiga E, MD  Fluticasone-Salmeterol  (ADVAIR) 500-50 MCG/DOSE AEPB Inhale 1 puff into the lungs every 12 (twelve) hours.    04/16/11  [provider]  lisinopril (PRINIVIL,ZESTRIL) 40 MG tablet Take 40 mg by mouth daily.    04/16/11  [provider]    Objective:   Vitals:   03/03/17 1113  BP: 107/69  Pulse: 69  Resp: 12  Temp: 98.1 F (36.7 C)  SpO2: 99%  Weight: 167 lb 3.2 oz (75.8 kg)   Exam General appearance : Awake, alert, not in any distress. Speech Clear. Not toxic looking HEENT: Atraumatic and Normocephalic, pupils equally reactive to light and accomodation Neck: Supple, no JVD. No cervical lymphadenopathy.  Chest: Good air entry bilaterally, no added sounds  CVS: S1 S2 regular, no murmurs.  Abdomen: Bowel sounds present, Non tender and not distended with no gaurding, rigidity or rebound. Extremities: B/L Lower Ext shows no edema,   both legs are warm to touch Neurology: Awake alert, and oriented X 3, CN II-XII intact, Non focal Skin: No Rash  Data Review Lab Results  Component Value Date   HGBA1C 6.9 02/17/2017   HGBA1C 6.4 (H) 10/01/2016   HGBA1C 6.4 06/10/2016    Assessment & Plan   1. Type 2 diabetes mellitus without complication, without long-term current use of insulin (HCC)  - HgB A1c - Glucose (CBG), Fasting  2. Essential hypertension  We have discussed target BP range and blood pressure goal. I have advised patient to check BP regularly and to call us back or report to clinic if the numbers are consistently higher than 140/90. We discussed the importance of compliance with medical therapy and DASH diet recommended, consequences of uncontrolled hypertension discussed.  - continue current BP medications  3. Acute sinusitis, recurrence not specified, unspecified location  - levofloxacin (LEVAQUIN) 750 MG tablet; Take 1 tablet (750 mg total) by mouth daily for 10 days.  Dispense: 10 tablet; Refill: 0 - Ambulatory referral to ENT - dextromethorphan-guaiFENesin (MUCINEX DM)  30-600 MG 12hr tablet; Take 1 tablet by mouth 2 (two) times daily for 10 days.  Dispense: 20 tablet; Refill: 0 - predniSONE (DELTASONE) 20 MG tablet; Take 1 tablet (20 mg total) by mouth daily with breakfast for 5 days.  Dispense: 5 tablet; Refill: 0  4. Hearing difficulty of both ears  - Ambulatory referral to ENT   Patient have been counseled extensively about nutrition and exercise. Other issues discussed during this visit include: low cholesterol diet, weight control and daily exercise, foot care, annual eye examinations at Ophthalmology, importance of adherence with medications and regular follow-up. We also discussed long term complications of uncontrolled diabetes and hypertension.   Return in about 4 weeks (around 03/31/2017), or if symptoms worsen or fail to improve.  The patient was given clear instructions to go to ER or return to medical center if symptoms don't improve, worsen or new problems develop. The patient verbalized understanding. The patient was told to call to get lab results if they haven't heard anything in the next week.   This note has been created with Dragon speech recognition software and smart phrase technology. Any transcriptional errors are unintentional.    Olugbemiga Jegede, MD, MHA, FACP, FAAP, CPE [facility] [place], [place] [phone]   03/03/2017, 12:08 PM 

## 2017-03-04 ENCOUNTER — Telehealth: Payer: Self-pay | Admitting: Internal Medicine

## 2017-03-04 NOTE — Telephone Encounter (Signed)
Von form [facility] called stating that pt. Had an appt. Yesterday and she is still feeing bad. Pt. Only has two pills left on her Zofran and she still has headaches, and vomiting.  Please f/u with pt.

## 2017-03-04 NOTE — Telephone Encounter (Signed)
Please tell Von to send patient to the ED/UC

## 2017-03-04 NOTE — Telephone Encounter (Signed)
Medical Assistant left message on Von's work [employer] voicemail. Voicemail states to give a call back to [place] [facility] at [phone]. MA advised Von of patient needing to report to the ED STAT per PCP regarding the reporting symptoms.

## 2017-03-05 ENCOUNTER — Encounter (HOSPITAL_COMMUNITY): Payer: Self-pay

## 2017-03-05 ENCOUNTER — Other Ambulatory Visit: Payer: Self-pay

## 2017-03-05 ENCOUNTER — Emergency Department (HOSPITAL_COMMUNITY)
Admission: EM | Admit: 2017-03-05 | Discharge: 2017-03-05 | Disposition: A | Discharge status: Home / Self Care | Payer: Medicaid Other | Attending: Emergency Medicine | Admitting: Emergency Medicine

## 2017-03-05 DIAGNOSIS — Z79899 Other long term (current) drug therapy: Secondary | ICD-10-CM | POA: Diagnosis not present

## 2017-03-05 DIAGNOSIS — J449 Chronic obstructive pulmonary disease, unspecified: Secondary | ICD-10-CM | POA: Diagnosis not present

## 2017-03-05 DIAGNOSIS — R112 Nausea with vomiting, unspecified: Secondary | ICD-10-CM | POA: Diagnosis present

## 2017-03-05 DIAGNOSIS — F17210 Nicotine dependence, cigarettes, uncomplicated: Secondary | ICD-10-CM | POA: Diagnosis not present

## 2017-03-05 DIAGNOSIS — E860 Dehydration: Secondary | ICD-10-CM | POA: Diagnosis not present

## 2017-03-05 DIAGNOSIS — Z7984 Long term (current) use of oral hypoglycemic drugs: Secondary | ICD-10-CM | POA: Insufficient documentation

## 2017-03-05 DIAGNOSIS — R197 Diarrhea, unspecified: Secondary | ICD-10-CM | POA: Diagnosis not present

## 2017-03-05 DIAGNOSIS — E119 Type 2 diabetes mellitus without complications: Secondary | ICD-10-CM | POA: Diagnosis not present

## 2017-03-05 DIAGNOSIS — Z96651 Presence of right artificial knee joint: Secondary | ICD-10-CM | POA: Insufficient documentation

## 2017-03-05 DIAGNOSIS — I10 Essential (primary) hypertension: Secondary | ICD-10-CM | POA: Insufficient documentation

## 2017-03-05 DIAGNOSIS — J45909 Unspecified asthma, uncomplicated: Secondary | ICD-10-CM | POA: Insufficient documentation

## 2017-03-05 LAB — COMPREHENSIVE METABOLIC PANEL
ALT: 26 U/L (ref 14–54)
AST: 29 U/L (ref 15–41)
Albumin: 4.2 g/dL (ref 3.5–5.0)
Alkaline Phosphatase: 103 U/L (ref 38–126)
Anion gap: 11 (ref 5–15)
BUN: 17 mg/dL (ref 6–20)
CO2: 28 mmol/L (ref 22–32)
Calcium: 9.7 mg/dL (ref 8.9–10.3)
Chloride: 97 mmol/L — ABNORMAL LOW (ref 101–111)
Creatinine, Ser: 0.87 mg/dL (ref 0.44–1.00)
GFR calc Af Amer: >60 mL/min (ref >60)
GFR calc non Af Amer: >60 mL/min (ref >60)
Glucose, Bld: 123 mg/dL — ABNORMAL HIGH (ref 65–99)
Potassium: 3.9 mmol/L (ref 3.5–5.1)
Sodium: 136 mmol/L (ref 135–145)
Total Bilirubin: 0.5 mg/dL (ref 0.3–1.2)
Total Protein: 7.9 g/dL (ref 6.5–8.1)

## 2017-03-05 LAB — CBC
HCT: 34.9 % — ABNORMAL LOW (ref 36.0–46.0)
Hemoglobin: 12 g/dL (ref 12.0–15.0)
MCH: 25.4 pg — ABNORMAL LOW (ref 26.0–34.0)
MCHC: 34.4 g/dL (ref 30.0–36.0)
MCV: 73.9 fL — ABNORMAL LOW (ref 78.0–100.0)
Platelets: 292 10*3/uL (ref 150–400)
RBC: 4.72 MIL/uL (ref 3.87–5.11)
RDW: 16.8 % — ABNORMAL HIGH (ref 11.5–15.5)
WBC: 5.1 10*3/uL (ref 4.0–10.5)

## 2017-03-05 LAB — URINALYSIS, ROUTINE W REFLEX MICROSCOPIC
Bilirubin Urine: NEGATIVE
Glucose, UA: NEGATIVE mg/dL
Hgb urine dipstick: NEGATIVE
Ketones, ur: NEGATIVE mg/dL
Leukocytes, UA: NEGATIVE
Nitrite: NEGATIVE
Protein, ur: NEGATIVE mg/dL
Specific Gravity, Urine: 1.009 (ref 1.005–1.030)
pH: 7 (ref 5.0–8.0)

## 2017-03-05 LAB — LIPASE, BLOOD: Lipase: 28 U/L (ref 11–51)

## 2017-03-05 MED ORDER — FENTANYL CITRATE (PF) 100 MCG/2ML IJ SOLN
50.0000 ug | Freq: Once | INTRAMUSCULAR | Status: AC
Start: 2017-03-05 — End: 2017-03-05
  Administered 2017-03-05: 50 ug via INTRAVENOUS
  Filled 2017-03-05: qty 2

## 2017-03-05 MED ORDER — DICYCLOMINE HCL 10 MG PO CAPS
20.0000 mg | ORAL_CAPSULE | Freq: Once | ORAL | Status: AC
  Administered 2017-03-05: 20 mg via ORAL
  Filled 2017-03-05: qty 2

## 2017-03-05 MED ORDER — SODIUM CHLORIDE 0.9 % IV BOLUS (SEPSIS)
1000.0000 mL | Freq: Once | INTRAVENOUS | Status: AC
Start: 2017-03-05 — End: 2017-03-05
  Administered 2017-03-05: 1000 mL via INTRAVENOUS

## 2017-03-05 MED ORDER — PROMETHAZINE HCL 25 MG PO TABS: 25.0000 mg | ORAL_TABLET | Freq: Four times a day (QID) | ORAL | 0 refills | Status: DC | PRN

## 2017-03-05 MED ORDER — SODIUM CHLORIDE 0.9 % IV BOLUS (SEPSIS)
1000.0000 mL | Freq: Once | INTRAVENOUS | Status: AC
  Administered 2017-03-05: 1000 mL via INTRAVENOUS

## 2017-03-05 MED ORDER — DICYCLOMINE HCL 20 MG PO TABS: 20.0000 mg | ORAL_TABLET | Freq: Three times a day (TID) | ORAL | 0 refills | Status: DC

## 2017-03-05 MED ORDER — ONDANSETRON 4 MG PO TBDP
4.0000 mg | ORAL_TABLET | Freq: Once | ORAL | Status: AC | PRN
  Administered 2017-03-05: 4 mg via ORAL
  Filled 2017-03-05: qty 1

## 2017-03-05 MED ORDER — PROMETHAZINE HCL 25 MG/ML IJ SOLN
12.5000 mg | Freq: Once | INTRAMUSCULAR | Status: AC
  Administered 2017-03-05: 12.5 mg via INTRAVENOUS
  Filled 2017-03-05: qty 1

## 2017-03-05 NOTE — ED Notes (Signed)
Patient just urinated and said she will let us know when she is able to urinate

## 2017-03-05 NOTE — ED Provider Notes (Signed)
[facility] DEPT Provider Note   CSN: [id number] Arrival date & time: 03/05/17  1656     History   Chief Complaint Chief Complaint  Patient presents with  . Emesis  . Diarrhea  . Abdominal Pain    HPI Stacy [name] is a 57 y.o. female.  HPI   57 year old female with extensive past medical history as below including irritable bowel syndrome here with nausea, vomiting, diarrhea.  The patient states that over the last 2 months, she is been battling recurrent episodes of vertigo and dizziness due to bilateral ear effusions.  She is been on multiple courses of steroids.  She is also been on antibiotics intermittently.  She believes that this has flared up her IBS.  Over the last 2-3 days, she is had diffuse abdominal cramping, nausea, vomiting, and diarrhea.  She has had difficulty eating and drinking.  She has had loose stools that seem to correlate with her cramping abdominal pain.  She denies any known sick contacts.  No fevers.  Denies any urinary symptoms.  No vaginal bleeding or discharge.  Symptoms worsen with stress as well as with eating.  No alleviating factors.  Past Medical History:  Diagnosis Date  . Arthritis   . Arthrofibrosis of total knee replacement ([place])    right  . Asthma   . COPD (chronic obstructive pulmonary disease) ([place])   . Diabetes mellitus    Type II  . GERD (gastroesophageal reflux disease)    Pt on Protonix daily  . Glaucoma   . Gout   . Headache(784.0)    otc meds prn  . Hyperlipidemia   . Hypertension    Pt on lisinopril  . Irritable bowel syndrome 11/19/2010  . Neuropathy   . Pneumonia   . Restless legs   . Shortness of breath    occasional - uses breathing tx at home    Patient Active Problem List   Diagnosis Date Noted  . Acute sinusitis 03/03/2017  . Hearing difficulty of both ears 03/03/2017  . 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 Lichen planopilaris 50/35/4656  . Herniation of lumbar  intervertebral disc with radiculopathy 10/02/2016    Class: Chronic  . Status post laminectomy 10/02/2016  . Alopecia areata 08/19/2016  . Gastroenteritis 03/11/2016  . postop Arthrofibrosis of knee joint, right 11/29/2015  . Pap smear for cervical cancer screening 10/24/2015  . Healthcare maintenance 10/24/2015  . Osteoarthritis of right knee 09/06/2015  . Type 2 diabetes mellitus without complication, without long-term current use of insulin ([place]) 06/13/2015  . Neurogenic claudication due to lumbar spinal stenosis 06/03/2015    Class: Chronic  . Chondromalacia of both patellae 06/03/2015    Class: Chronic  . Spinal stenosis, lumbar region, with neurogenic claudication 06/03/2015  . Tobacco use disorder 04/25/2015  . DJD (degenerative joint disease) of knee 01/04/2015  . Falls frequently 11/29/2014  . Hemorrhoid 11/14/2014  . Bilateral knee pain 07/31/2014  . Asthma, chronic 07/19/2014  . Gout of big toe 07/19/2014  . Midline low back pain with right-sided sciatica 01/01/2014  . Bilateral low back pain without sciatica 08/14/2013  . Essential hypertension 08/14/2013  . Gastroesophageal reflux disease without esophagitis 08/14/2013  . COPD exacerbation ([place]) 08/14/2013  . Colon cancer screening 08/14/2013  . Low back pain 05/15/2013  . Preventative health care 05/15/2013  . GERD (gastroesophageal reflux disease) 02/16/2013  . DM (diabetes mellitus) ([place]) 02/16/2013  . COPD (chronic obstructive pulmonary disease) ([place]) 04/17/2011  . Hypertension 04/17/2011  .  Cough 04/16/2011    Past Surgical History:  Procedure Laterality Date  . CHOLECYSTECTOMY    . COLONOSCOPY    . ENDOMETRIAL ABLATION  10/2010  . HERNIA REPAIR     umbicial hernia  . KNEE CLOSED REDUCTION Right 12/06/2015   Procedure: CLOSED MANIPULATION RIGHT KNEE;  Surgeon: [name], MD;  Location: [facility];  Service: Orthopedics;  Laterality: Right;  . KNEE CLOSED REDUCTION Right 01/17/2016   Procedure: CLOSED  MANIPULATION RIGHT KNEE;  Surgeon: [name], MD;  Location: [facility];  Service: Orthopedics;  Laterality: Right;  . KNEE JOINT MANIPULATION Right 12/06/2015  . LUMBAR DISC SURGERY  06/03/2015   L 2  L3 L4 L5   . LUMBAR LAMINECTOMY/DECOMPRESSION MICRODISCECTOMY N/A 06/03/2015   Procedure: Bilateral lateral recess decompression L2-3, L3-4, L4-5;  Surgeon: [name], MD;  Location: [facility];  Service: Orthopedics;  Laterality: N/A;  . LUMBAR LAMINECTOMY/DECOMPRESSION MICRODISCECTOMY N/A 10/02/2016   Procedure: Right L5-S1 Lateral Recess Decompression  microdiscectomy;  Surgeon: [name], MD;  Location: [facility];  Service: Orthopedics;  Laterality: N/A;  . svd      x 2  . TOTAL KNEE ARTHROPLASTY Right 09/06/2015   Procedure: RIGHT TOTAL KNEE ARTHROPLASTY;  Surgeon: [name], MD;  Location: [facility];  Service: Orthopedics;  Laterality: Right;  . TUBAL LIGATION    . UPPER GASTROINTESTINAL ENDOSCOPY  04/28/11    OB History    No data available       Home Medications    Prior to Admission medications   Medication Sig Start Date End Date Taking? Authorizing Provider  acetaminophen-codeine (TYLENOL #3) 300-30 MG tablet Take 1 tablet by mouth every 4 (four) hours as needed. 02/17/17  Yes Jegede, Olugbemiga E, MD  albuterol (PROVENTIL) (2.5 MG/3ML) 0.083% nebulizer solution USE 1 VIAL VIA NEBULIZER EVERY 4 HOURS AS NEEDED FOR WHEEZING 02/17/17  Yes Jegede, Olugbemiga E, MD  allopurinol (ZYLOPRIM) 100 MG tablet Take 1 tablet (100 mg total) by mouth daily. 11/23/16  Yes Tresa Garter, MD  budesonide-formoterol (SYMBICORT) 80-4.5 MCG/ACT inhaler INHALE TWO PUFFS BY MOUTH TWICE A DAY 02/17/17  Yes Jegede, Olugbemiga E, MD  cholecalciferol (VITAMIN D) 1000 units tablet Take 1,000 Units by mouth daily.   Yes [provider]  doxycycline (VIBRA-TABS) 100 MG tablet Take 1 tablet (100 mg total) by mouth 2 (two) times daily. 02/17/17  Yes Jegede, Olugbemiga E, MD  EPINEPHrine 0.3 mg/0.3 mL IJ  SOAJ injection Inject 0.3 mLs (0.3 mg total) into the muscle once. Patient taking differently: Inject 0.3 mg into the muscle daily as needed (for allergic reaction).  09/10/14  Yes Jegede, Olugbemiga E, MD  fluocinonide (LIDEX) 0.05 % external solution Apply 1 application topically 2 (two) times daily. 10/09/16  Yes Kinnie Feil, MD  fluticasone (FLONASE) 50 MCG/ACT nasal spray Place 2 sprays into both nostrils daily. 02/17/17  Yes Tresa Garter, MD  gabapentin (NEURONTIN) 300 MG capsule Take 1 capsule (300 mg total) by mouth 3 (three) times daily. 11/23/16  Yes Tresa Garter, MD  hydrochlorothiazide (HYDRODIURIL) 25 MG tablet Take 25 mg by mouth daily. 02/18/17  Yes [provider]  hydrOXYzine (ATARAX/VISTARIL) 10 MG tablet Take 1 tablet (10 mg total) by mouth 3 (three) times daily as needed for itching. 11/23/16  Yes Tresa Garter, MD  levofloxacin (LEVAQUIN) 750 MG tablet Take 1 tablet (750 mg total) by mouth daily for 10 days. 03/03/17 03/13/17 Yes Tresa Garter, MD  metFORMIN (GLUCOPHAGE-XR) 750 MG 24 hr tablet Take 1 tablet (750 mg total) by mouth daily with breakfast. 11/23/16  Yes Jegede, Olugbemiga E, MD  methocarbamol (ROBAXIN) 500 MG tablet Take 1 tablet (500 mg total) by mouth every 6 (six) hours as needed for muscle spasms. 11/23/16  Yes Jegede, Olugbemiga E, MD  MITIGARE 0.6 MG CAPS Take 1 capsule by mouth daily. 01/20/17  Yes Jegede, Marlena Clipper, MD  mometasone-formoterol (DULERA) 100-5 MCG/ACT AERO Inhale 2 puffs into the lungs 2 (two) times daily. 06/10/16  Yes Tresa Garter, MD  Multiple Vitamin (MULTIVITAMIN WITH MINERALS) TABS tablet Take 1 tablet by mouth daily.   Yes [provider]  neomycin-polymyxin-hydrocortisone (CORTISPORIN) OTIC solution Place 4 drops into both ears 4 (four) times daily. 02/17/17  Yes Jegede, Olugbemiga E, MD  nicotine (NICODERM CQ - DOSED IN MG/24 HOURS) 14 mg/24hr patch Place 1 patch (14 mg total) onto the  skin daily. 02/17/17  Yes Tresa Garter, MD  ondansetron (ZOFRAN ODT) 4 MG disintegrating tablet Take 1 tablet (4 mg total) by mouth every 8 (eight) hours as needed for nausea or vomiting. 02/17/17  Yes Tresa Garter, MD  pantoprazole (PROTONIX) 40 MG tablet Take 1 tablet (40 mg total) by mouth daily. 11/23/16  Yes Tresa Garter, MD  predniSONE (DELTASONE) 20 MG tablet Take 1 tablet (20 mg total) by mouth daily with breakfast for 5 days. 03/03/17 03/08/17 Yes Tresa Garter, MD  simvastatin (ZOCOR) 20 MG tablet Take 1 tablet (20 mg total) by mouth at bedtime. 11/23/16  Yes Jegede, Marlena Clipper, MD  sucralfate (CARAFATE) 1 g tablet Take 1 g by mouth 4 (four) times daily -  with meals and at bedtime.   Yes [provider]  valsartan-hydrochlorothiazide (DIOVAN-HCT) 160-12.5 MG tablet Take 1 tablet by mouth daily. 11/23/16  Yes Tresa Garter, MD  Blood Glucose Monitoring Suppl (ACCU-CHEK AVIVA PLUS) w/Device KIT 1 each by Does not apply route 3 (three) times daily. 06/10/16   Tresa Garter, MD  celecoxib (CELEBREX) 200 MG capsule Take 200 mg by mouth 2 (two) times daily. 02/18/17   [provider]  dextromethorphan-guaiFENesin (MUCINEX DM) 30-600 MG 12hr tablet Take 1 tablet by mouth 2 (two) times daily for 10 days. Patient not taking: Reported on 03/05/2017 03/03/17 03/13/17  Tresa Garter, MD  diclofenac sodium (VOLTAREN) 1 % GEL Apply 4 g topically 4 (four) times daily. Patient not taking: Reported on 03/03/2017 11/26/16   [name], MD  dicyclomine (BENTYL) 20 MG tablet Take 1 tablet (20 mg total) by mouth 4 (four) times daily -  before meals and at bedtime for 10 days. 03/05/17 03/15/17  Duffy Bruce, MD  glucose blood (TRUE METRIX BLOOD GLUCOSE TEST) test strip USE AS DIRECTED BY PHYSICIAN 11/23/16   Tresa Garter, MD  hydrocortisone-pramoxine [facility]) 2.5-1 % rectal cream Place 1 application rectally 2 (two) times daily. Patient  not taking: Reported on 02/17/2017 11/16/14   Charlott Rakes, MD  Lancets (ACCU-CHEK SOFT TOUCH) lancets Use as instructed 11/23/16   Tresa Garter, MD  promethazine (PHENERGAN) 25 MG tablet Take 1 tablet (25 mg total) by mouth every 6 (six) hours as needed for nausea or vomiting. 03/05/17   Duffy Bruce, MD  Fluticasone-Salmeterol (ADVAIR) 500-50 MCG/DOSE AEPB Inhale 1 puff into the lungs every 12 (twelve) hours.    04/16/11  [provider]  lisinopril (PRINIVIL,ZESTRIL) 40 MG tablet Take 40 mg by mouth daily.    04/16/11  [provider]    Family History Family History  Problem Relation Age of Onset  . Hypertension Father   . Cancer Father   . Heart disease Mother   . Asthma Son        had as a child  . Heart disease Sister   . Breast cancer Sister   . Hypertension Brother     Social History Social History   Tobacco Use  . Smoking status: Current Some Day Smoker    Packs/day: 0.25    Years: 32.00    Pack years: 8.00    Types: Cigarettes  . Smokeless tobacco: Never Used  Substance Use Topics  . Alcohol use: No  . Drug use: No     Allergies   Other; Ace inhibitors; and Aspirin   Review of Systems Review of Systems  Constitutional: Positive for fatigue.  HENT: Positive for ear pain (Ear pressure).   Gastrointestinal: Positive for abdominal pain, diarrhea, nausea and vomiting.  Neurological: Positive for dizziness.  All other systems reviewed and are negative.    Physical Exam Updated Vital Signs BP 102/66 (BP Location: Left Arm)   Pulse (!) 56   Temp 98.7 F (37.1 C) (Oral)   Resp 20   Ht '5\' 6"'$  (1.676 m)   Wt 75.8 kg (167 lb)   SpO2 99%   BMI 26.95 kg/m   Physical Exam  Constitutional: She is oriented to person, place, and time. She appears well-developed and well-nourished. No distress.  HENT:  Head: Normocephalic and atraumatic.  Bilateral serous otitis media.  No erythema or opacification.  Mastoids nontender and  nonedematous.  Eyes: Conjunctivae are normal.  Neck: Neck supple.  Cardiovascular: Normal rate, regular rhythm and normal heart sounds. Exam reveals no friction rub.  No murmur heard. Pulmonary/Chest: Effort normal and breath sounds normal. No respiratory distress. She has no wheezes. She has no rales.  Abdominal: She exhibits no distension.  Mild, diffuse tenderness.  Hyperactive bowel sounds.  No rebound or guarding.  Musculoskeletal: She exhibits no edema.  Neurological: She is alert and oriented to person, place, and time. She exhibits normal muscle tone.  Skin: Skin is warm. Capillary refill takes less than 2 seconds.  Psychiatric: She has a normal mood and affect.  Nursing note and vitals reviewed.    ED Treatments / Results  Labs (all labs ordered are listed, but only abnormal results are displayed) Labs Reviewed  COMPREHENSIVE METABOLIC PANEL - Abnormal; Notable for the following components:      Result Value   Chloride 97 (*)    Glucose, Bld 123 (*)    All other components within normal limits  CBC - Abnormal; Notable for the following components:   HCT 34.9 (*)    MCV 73.9 (*)    MCH 25.4 (*)    RDW 16.8 (*)    All other components within normal limits  LIPASE, BLOOD  URINALYSIS, ROUTINE W REFLEX MICROSCOPIC    EKG  EKG Interpretation None       Radiology No results found.  Procedures Procedures (including critical care time)  Medications Ordered in ED Medications  ondansetron (ZOFRAN-ODT) disintegrating tablet 4 mg (4 mg Oral Given 03/05/17 1713)  sodium chloride 0.9 % bolus 1,000 mL (0 mLs Intravenous Stopped 03/05/17 2151)  promethazine (PHENERGAN) injection 12.5 mg (12.5 mg Intravenous Given 03/05/17 2010)  dicyclomine (BENTYL) capsule 20 mg (20 mg Oral Given 03/05/17 2009)  sodium chloride 0.9 % bolus 1,000 mL (0 mLs Intravenous Stopped 03/05/17  2319)  fentaNYL (SUBLIMAZE) injection 50 mcg (50 mcg Intravenous Given 03/05/17 2227)     Initial Impression /  Assessment and Plan / ED Course  I have reviewed the triage vital signs and the nursing notes.  Pertinent labs & imaging results that were available during my care of the patient were reviewed by me and considered in my medical decision making (see chart for details).     58 yo F with PMHx as above here with n/v/d in setting of recent ABX and prednisone use. Abdomen is soft, only minimally tender on my exam. Lab work is very reassuring, with normal WBC, normal LFTs/renal function, normal lipase, and unremarkable UA. She feels markedly improved after bentyl and antiemetics. She has a long h/o recurrent n/v/d due to her IBS. I suspect this may be acute on chronic IBS, versus viral GI illness, versus possibly ABX-associated or steroid-associated GI upset after recent recurrent use. No leukocytosis, no signs to suggest C. Diff infection. She is tolerating PO without difficulty after fluids, antiemetics in ED. Given reassuring serial exams and normal labs with long h/o similar episodes, will tx with bentyl, antiemetics at home with good return precautions. Pt in agreement.  Final Clinical Impressions(s) / ED Diagnoses   Final diagnoses:  Dehydration  Nausea vomiting and diarrhea    ED Discharge Orders        Ordered    promethazine (PHENERGAN) 25 MG tablet  Every 6 hours PRN     03/05/17 2301    dicyclomine (BENTYL) 20 MG tablet  3 times daily before meals & bedtime     03/05/17 2301       Duffy Bruce, MD 03/06/17 0000

## 2017-03-05 NOTE — ED Triage Notes (Signed)
Patient c/o N/V/D and mid abdominal pain x 2 weeks.

## 2017-03-25 DIAGNOSIS — H6983 Other specified disorders of Eustachian tube, bilateral: Secondary | ICD-10-CM | POA: Insufficient documentation

## 2017-03-25 DIAGNOSIS — H6993 Unspecified Eustachian tube disorder, bilateral: Secondary | ICD-10-CM | POA: Insufficient documentation

## 2017-03-25 DIAGNOSIS — J324 Chronic pansinusitis: Secondary | ICD-10-CM | POA: Diagnosis not present

## 2017-03-25 DIAGNOSIS — H903 Sensorineural hearing loss, bilateral: Secondary | ICD-10-CM | POA: Diagnosis not present

## 2017-03-26 ENCOUNTER — Other Ambulatory Visit: Payer: Self-pay | Admitting: Internal Medicine

## 2017-03-26 DIAGNOSIS — Z96651 Presence of right artificial knee joint: Secondary | ICD-10-CM

## 2017-03-26 DIAGNOSIS — F17200 Nicotine dependence, unspecified, uncomplicated: Secondary | ICD-10-CM

## 2017-03-26 DIAGNOSIS — E119 Type 2 diabetes mellitus without complications: Secondary | ICD-10-CM

## 2017-03-26 DIAGNOSIS — J441 Chronic obstructive pulmonary disease with (acute) exacerbation: Secondary | ICD-10-CM

## 2017-03-31 ENCOUNTER — Encounter: Payer: Self-pay | Admitting: Podiatry

## 2017-03-31 ENCOUNTER — Ambulatory Visit: Payer: Medicaid Other | Admitting: Podiatry

## 2017-03-31 DIAGNOSIS — M722 Plantar fascial fibromatosis: Secondary | ICD-10-CM | POA: Diagnosis not present

## 2017-03-31 MED ORDER — TRIAMCINOLONE ACETONIDE 10 MG/ML IJ SUSP
10.0000 mg | Freq: Once | INTRAMUSCULAR | Status: AC
  Administered 2017-03-31: 10 mg

## 2017-03-31 NOTE — Progress Notes (Signed)
Subjective:   Patient ID: Stacy [name], female   DOB: 57 y.o.   MRN: [id number]   HPI Patient presents stating the heels of both feet have really been bothering her recently   ROS      Objective:  Physical Exam  Neurovascular status intact with patient's heel sore bilateral     Assessment:  Acute plantar fasciitis bilateral     Plan:  Injected the plantar fascial bilateral 3 mg Kenalog 5 mg Xylocaine and instructed on physical therapy for the patient.  Reappoint for us to recheck

## 2017-04-01 ENCOUNTER — Ambulatory Visit: Payer: Medicaid Other | Attending: Internal Medicine | Admitting: Internal Medicine

## 2017-04-01 ENCOUNTER — Encounter: Payer: Self-pay | Admitting: Internal Medicine

## 2017-04-01 VITALS — BP 155/91 | HR 64 | Temp 98.3°F | Resp 16 | Ht 65.0 in | Wt 166.2 lb

## 2017-04-01 DIAGNOSIS — F17210 Nicotine dependence, cigarettes, uncomplicated: Secondary | ICD-10-CM | POA: Diagnosis not present

## 2017-04-01 DIAGNOSIS — I10 Essential (primary) hypertension: Secondary | ICD-10-CM | POA: Diagnosis not present

## 2017-04-01 DIAGNOSIS — E1140 Type 2 diabetes mellitus with diabetic neuropathy, unspecified: Secondary | ICD-10-CM | POA: Diagnosis not present

## 2017-04-01 DIAGNOSIS — Z7984 Long term (current) use of oral hypoglycemic drugs: Secondary | ICD-10-CM | POA: Diagnosis not present

## 2017-04-01 DIAGNOSIS — J449 Chronic obstructive pulmonary disease, unspecified: Secondary | ICD-10-CM

## 2017-04-01 DIAGNOSIS — Z886 Allergy status to analgesic agent status: Secondary | ICD-10-CM | POA: Insufficient documentation

## 2017-04-01 DIAGNOSIS — G4700 Insomnia, unspecified: Secondary | ICD-10-CM | POA: Diagnosis not present

## 2017-04-01 DIAGNOSIS — G2581 Restless legs syndrome: Secondary | ICD-10-CM | POA: Diagnosis not present

## 2017-04-01 DIAGNOSIS — E119 Type 2 diabetes mellitus without complications: Secondary | ICD-10-CM | POA: Diagnosis not present

## 2017-04-01 DIAGNOSIS — Z79899 Other long term (current) drug therapy: Secondary | ICD-10-CM | POA: Diagnosis not present

## 2017-04-01 DIAGNOSIS — M109 Gout, unspecified: Secondary | ICD-10-CM | POA: Insufficient documentation

## 2017-04-01 DIAGNOSIS — D649 Anemia, unspecified: Secondary | ICD-10-CM | POA: Diagnosis not present

## 2017-04-01 DIAGNOSIS — M1712 Unilateral primary osteoarthritis, left knee: Secondary | ICD-10-CM | POA: Diagnosis not present

## 2017-04-01 DIAGNOSIS — F17200 Nicotine dependence, unspecified, uncomplicated: Secondary | ICD-10-CM | POA: Diagnosis not present

## 2017-04-01 DIAGNOSIS — M722 Plantar fascial fibromatosis: Secondary | ICD-10-CM | POA: Diagnosis not present

## 2017-04-01 DIAGNOSIS — H9190 Unspecified hearing loss, unspecified ear: Secondary | ICD-10-CM | POA: Diagnosis not present

## 2017-04-01 NOTE — Patient Instructions (Addendum)
Increase evening dose of Hydroxyzine to 10 mg 2 tablets.  Insomnia Insomnia is a sleep disorder that makes it difficult to fall asleep or to stay asleep. Insomnia can cause tiredness (fatigue), low energy, difficulty concentrating, mood swings, and poor performance at work or school. There are three different ways to classify insomnia:  Difficulty falling asleep.  Difficulty staying asleep.  Waking up too early in the morning.  Any type of insomnia can be long-term (chronic) or short-term (acute). Both are common. Short-term insomnia usually lasts for three months or less. Chronic insomnia occurs at least three times a week for longer than three months. What are the causes? Insomnia may be caused by another condition, situation, or substance, such as:  Anxiety.  Certain medicines.  Gastroesophageal reflux disease (GERD) or other gastrointestinal conditions.  Asthma or other breathing conditions.  Restless legs syndrome, sleep apnea, or other sleep disorders.  Chronic pain.  Menopause. This may include hot flashes.  Stroke.  Abuse of alcohol, tobacco, or illegal drugs.  Depression.  Caffeine.  Neurological disorders, such as Alzheimer disease.  An overactive thyroid (hyperthyroidism).  The cause of insomnia may not be known. What increases the risk? Risk factors for insomnia include:  Gender. Women are more commonly affected than men.  Age. Insomnia is more common as you get older.  Stress. This may involve your professional or personal life.  Income. Insomnia is more common in people with lower income.  Lack of exercise.  Irregular work schedule or night shifts.  Traveling between different time zones.  What are the signs or symptoms? If you have insomnia, trouble falling asleep or trouble staying asleep is the main symptom. This may lead to other symptoms, such as:  Feeling fatigued.  Feeling nervous about going to sleep.  Not feeling rested in the  morning.  Having trouble concentrating.  Feeling irritable, anxious, or depressed.  How is this treated? Treatment for insomnia depends on the cause. If your insomnia is caused by an underlying condition, treatment will focus on addressing the condition. Treatment may also include:  Medicines to help you sleep.  Counseling or therapy.  Lifestyle adjustments.  Follow these instructions at home:  Take medicines only as directed by your health care provider.  Keep regular sleeping and waking hours. Avoid naps.  Keep a sleep diary to help you and your health care provider figure out what could be causing your insomnia. Include: ? When you sleep. ? When you wake up during the night. ? How well you sleep. ? How rested you feel the next day. ? Any side effects of medicines you are taking. ? What you eat and drink.  Make your bedroom a comfortable place where it is easy to fall asleep: ? Put up shades or special blackout curtains to block light from outside. ? Use a white noise machine to block noise. ? Keep the temperature cool.  Exercise regularly as directed by your health care provider. Avoid exercising right before bedtime.  Use relaxation techniques to manage stress. Ask your health care provider to suggest some techniques that may work well for you. These may include: ? Breathing exercises. ? Routines to release muscle tension. ? Visualizing peaceful scenes.  Cut back on alcohol, caffeinated beverages, and cigarettes, especially close to bedtime. These can disrupt your sleep.  Do not overeat or eat spicy foods right before bedtime. This can lead to digestive discomfort that can make it hard for you to sleep.  Limit screen use before bedtime.  This includes: ? Watching TV. ? Using your smartphone, tablet, and computer.  Stick to a routine. This can help you fall asleep faster. Try to do a quiet activity, brush your teeth, and go to bed at the same time each night.  Get  out of bed if you are still awake after 15 minutes of trying to sleep. Keep the lights down, but try reading or doing a quiet activity. When you feel sleepy, go back to bed.  Make sure that you drive carefully. Avoid driving if you feel very sleepy.  Keep all follow-up appointments as directed by your health care provider. This is important. Contact a health care provider if:  You are tired throughout the day or have trouble in your daily routine due to sleepiness.  You continue to have sleep problems or your sleep problems get worse. Get help right away if:  You have serious thoughts about hurting yourself or someone else. This information is not intended to replace advice given to you by your health care provider. Make sure you discuss any questions you have with your health care provider. Document Released: 01/17/2000 Document Revised: 06/21/2015 Document Reviewed: 10/20/2013 Elsevier Interactive Patient Education  2018 [employer].  Fall Prevention in the Home Falls can cause injuries. They can happen to people of all ages. There are many things you can do to make your home safe and to help prevent falls. What can I do on the outside of my home?  Regularly fix the edges of walkways and driveways and fix any cracks.  Remove anything that might make you trip as you walk through a door, such as a raised step or threshold.  Trim any bushes or trees on the path to your home.  Use bright outdoor lighting.  Clear any walking paths of anything that might make someone trip, such as rocks or tools.  Regularly check to see if handrails are loose or broken. Make sure that both sides of any steps have handrails.  Any raised decks and porches should have guardrails on the edges.  Have any leaves, snow, or ice cleared regularly.  Use sand or salt on walking paths during winter.  Clean up any spills in your garage right away. This includes oil or grease spills. What can I do in the  bathroom?  Use night lights.  Install grab bars by the toilet and in the tub and shower. Do not use towel bars as grab bars.  Use non-skid mats or decals in the tub or shower.  If you need to sit down in the shower, use a plastic, non-slip stool.  Keep the floor dry. Clean up any water that spills on the floor as soon as it happens.  Remove soap buildup in the tub or shower regularly.  Attach bath mats securely with double-sided non-slip rug tape.  Do not have throw rugs and other things on the floor that can make you trip. What can I do in the bedroom?  Use night lights.  Make sure that you have a light by your bed that is easy to reach.  Do not use any sheets or blankets that are too big for your bed. They should not hang down onto the floor.  Have a firm chair that has side arms. You can use this for support while you get dressed.  Do not have throw rugs and other things on the floor that can make you trip. What can I do in the kitchen?  Clean up  any spills right away.  Avoid walking on wet floors.  Keep items that you use a lot in easy-to-reach places.  If you need to reach something above you, use a strong step stool that has a grab bar.  Keep electrical cords out of the way.  Do not use floor polish or wax that makes floors slippery. If you must use wax, use non-skid floor wax.  Do not have throw rugs and other things on the floor that can make you trip. What can I do with my stairs?  Do not leave any items on the stairs.  Make sure that there are handrails on both sides of the stairs and use them. Fix handrails that are broken or loose. Make sure that handrails are as long as the stairways.  Check any carpeting to make sure that it is firmly attached to the stairs. Fix any carpet that is loose or worn.  Avoid having throw rugs at the top or bottom of the stairs. If you do have throw rugs, attach them to the floor with carpet tape.  Make sure that you have a  light switch at the top of the stairs and the bottom of the stairs. If you do not have them, ask someone to add them for you. What else can I do to help prevent falls?  Wear shoes that: ? Do not have high heels. ? Have rubber bottoms. ? Are comfortable and fit you well. ? Are closed at the toe. Do not wear sandals.  If you use a stepladder: ? Make sure that it is fully opened. Do not climb a closed stepladder. ? Make sure that both sides of the stepladder are locked into place. ? Ask someone to hold it for you, if possible.  Clearly mark and make sure that you can see: ? Any grab bars or handrails. ? First and last steps. ? Where the edge of each step is.  Use tools that help you move around (mobility aids) if they are needed. These include: ? Canes. ? Walkers. ? Scooters. ? Crutches.  Turn on the lights when you go into a dark area. Replace any light bulbs as soon as they burn out.  Set up your furniture so you have a clear path. Avoid moving your furniture around.  If any of your floors are uneven, fix them.  If there are any pets around you, be aware of where they are.  Review your medicines with your doctor. Some medicines can make you feel dizzy. This can increase your chance of falling. Ask your doctor what other things that you can do to help prevent falls. This information is not intended to replace advice given to you by your health care provider. Make sure you discuss any questions you have with your health care provider. Document Released: 11/15/2008 Document Revised: 06/27/2015 Document Reviewed: 02/23/2014 Elsevier Interactive Patient Education  [employer]2018 Elsevier Inc.

## 2017-04-01 NOTE — Progress Notes (Signed)
Patient ID: Stacy [name], female    DOB: 1960-07-31  MRN: [id number]  CC: re-establish; Diabetes; and Hypertension   Subjective: Stacy [name] is a 57 y.o. female who presents for chronic ds management.  Last saw Dr. Doreene Burke  Her concerns today include:  Pt with hx of HTN, DM with neuropathy, HL, COPD, RLS, gout, tob dep, anemia, recurrent sinusitis  1.  Seeing ENT, Dr. Janace Hoard, at [facility]. -had hearing ear last wk.  Told she needs hearing aids.  Also had CT of her sinuses.  Results copied below: IMPRESSION: 1. Hyperplastic and normally aerated paranasal sinuses. Patent sinus drainage pathways. 2. Symmetric nasal cavity mucosal thickening raising the possibility of rhinitis.  2.  Saw Podiatry, Dr. Paulla Dolly, for heel pain.  Dx with plantar fasciitis.  Given steroid injection which was helpful.  3.  COPD/tob dep: Compliant with Symbicort.  Has Dulera on med list but not taking that.  She is trying to quit smoking.  Has the nicotine patches but does not use consistently  4.  Had TKR RT (09/2015).  Has arthritis now in LT.   Had lumbar surgery 06/2015 and 09/2016 for disc disease. Followed by Dr. Deno Etienne for [facility] at [facility]. On Celebrex 200 mg twice daily  6.  Problems falling asleep and staying asleep.  She does not drink caffeinated or alcoholic beverages at nights.  Tries to get in bed around the same time every night. Patient Active Problem List   Diagnosis Date Noted  . Acute sinusitis 03/03/2017  . Hearing difficulty of both ears 03/03/2017  . 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 Herniation of lumbar intervertebral disc with radiculopathy 10/02/2016    Class: Chronic  . Status post laminectomy 10/02/2016  . Alopecia areata 08/19/2016  . Gastroenteritis 03/11/2016  . postop Arthrofibrosis of knee joint, right 11/29/2015  . Pap smear for cervical cancer screening 10/24/2015  . Healthcare maintenance 10/24/2015  .  Osteoarthritis of right knee 09/06/2015  . Type 2 diabetes mellitus without complication, without long-term current use of insulin ([place]) 06/13/2015  . Neurogenic claudication due to lumbar spinal stenosis 06/03/2015    Class: Chronic  . Chondromalacia of both patellae 06/03/2015    Class: Chronic  . Spinal stenosis, lumbar region, with neurogenic claudication 06/03/2015  . Tobacco use disorder 04/25/2015  . DJD (degenerative joint disease) of knee 01/04/2015  . Falls frequently 11/29/2014  . Hemorrhoid 11/14/2014  . Bilateral knee pain 07/31/2014  . Asthma, chronic 07/19/2014  . Gout of big toe 07/19/2014  . Midline low back pain with right-sided sciatica 01/01/2014  . Bilateral low back pain without sciatica 08/14/2013  . Essential hypertension 08/14/2013  . Gastroesophageal reflux disease without esophagitis 08/14/2013  . COPD exacerbation ([place]) 08/14/2013  . Colon cancer screening 08/14/2013  . Low back pain 05/15/2013  . Preventative health care 05/15/2013  . GERD (gastroesophageal reflux disease) 02/16/2013  . DM (diabetes mellitus) ([place]) 02/16/2013  . COPD (chronic obstructive pulmonary disease) ([place]) 04/17/2011  . Hypertension 04/17/2011  . Cough 04/16/2011     Current Outpatient Medications on File Prior to Visit  Medication Sig Dispense Refill  . acetaminophen-codeine (TYLENOL #3) 300-30 MG tablet TAKE 1 TABLET BY MOUTH EVERY 4 HOURS AS NEEDED 60 tablet 0  . albuterol (PROVENTIL) (2.5 MG/3ML) 0.083% nebulizer solution USE 1 VIAL VIA NEBULIZER EVERY 4 HOURS AS NEEDED FOR WHEEZING 1650 mL 3  . allopurinol (ZYLOPRIM) 100 MG tablet Take 1 tablet (100 mg total) by  mouth daily. 90 tablet 3  . Blood Glucose Monitoring Suppl (ACCU-CHEK AVIVA PLUS) w/Device KIT 1 each by Does not apply route 3 (three) times daily. 1 kit 0  . budesonide-formoterol (SYMBICORT) 80-4.5 MCG/ACT inhaler INHALE TWO PUFFS BY MOUTH TWICE A DAY 30.6 g 0  . celecoxib (CELEBREX) 200 MG capsule Take 200 mg by  mouth 2 (two) times daily.  5  . cholecalciferol (VITAMIN D) 1000 units tablet Take 1,000 Units by mouth daily.    . diclofenac sodium (VOLTAREN) 1 % GEL Apply 4 g topically 4 (four) times daily. (Patient not taking: Reported on 03/03/2017) 3 Tube 1  . dicyclomine (BENTYL) 20 MG tablet Take 1 tablet (20 mg total) by mouth 4 (four) times daily -  before meals and at bedtime for 10 days. 40 tablet 0  . EPINEPHrine 0.3 mg/0.3 mL IJ SOAJ injection Inject 0.3 mLs (0.3 mg total) into the muscle once. (Patient taking differently: Inject 0.3 mg into the muscle daily as needed (for allergic reaction). ) 1 Device 1  . fluocinonide (LIDEX) 0.05 % external solution Apply 1 application topically 2 (two) times daily. 60 mL 0  . fluticasone (FLONASE) 50 MCG/ACT nasal spray Place 2 sprays into both nostrils daily. 16 g 6  . gabapentin (NEURONTIN) 300 MG capsule Take 1 capsule (300 mg total) by mouth 3 (three) times daily. 270 capsule 3  . glucose blood (TRUE METRIX BLOOD GLUCOSE TEST) test strip USE AS DIRECTED BY PHYSICIAN 100 each 12  . hydrochlorothiazide (HYDRODIURIL) 25 MG tablet Take 25 mg by mouth daily.  3  . hydrOXYzine (ATARAX/VISTARIL) 10 MG tablet Take 1 tablet (10 mg total) by mouth 3 (three) times daily as needed for itching. 90 tablet 3  . Lancets (ACCU-CHEK SOFT TOUCH) lancets Use as instructed 100 each 12  . metFORMIN (GLUCOPHAGE-XR) 750 MG 24 hr tablet Take 1 tablet (750 mg total) by mouth daily with breakfast. 90 tablet 3  . methocarbamol (ROBAXIN) 500 MG tablet Take 1 tablet (500 mg total) by mouth every 6 (six) hours as needed for muscle spasms. 60 tablet 3  . MITIGARE 0.6 MG CAPS Take 1 capsule by mouth daily. 30 capsule 3  . Multiple Vitamin (MULTIVITAMIN WITH MINERALS) TABS tablet Take 1 tablet by mouth daily.    Marland Kitchen NICOTINE STEP 2 14 MG/24HR patch PLACE 1 PATCH ONTO THE SKIN DAILY 30 patch 3  . pantoprazole (PROTONIX) 40 MG tablet Take 1 tablet (40 mg total) by mouth daily. 90 tablet 3  .  promethazine (PHENERGAN) 25 MG tablet Take 1 tablet (25 mg total) by mouth every 6 (six) hours as needed for nausea or vomiting. 20 tablet 0  . simvastatin (ZOCOR) 20 MG tablet Take 1 tablet (20 mg total) by mouth at bedtime. 90 tablet 3  . sucralfate (CARAFATE) 1 g tablet Take 1 g by mouth 4 (four) times daily -  with meals and at bedtime.    . valsartan-hydrochlorothiazide (DIOVAN-HCT) 160-12.5 MG tablet Take 1 tablet by mouth daily. 90 tablet 3  . [DISCONTINUED] Fluticasone-Salmeterol (ADVAIR) 500-50 MCG/DOSE AEPB Inhale 1 puff into the lungs every 12 (twelve) hours.      . [DISCONTINUED] lisinopril (PRINIVIL,ZESTRIL) 40 MG tablet Take 40 mg by mouth daily.       No current facility-administered medications on file prior to visit.     Allergies  Allergen Reactions  . Other Shortness Of Breath    UNSPECIFIED AGENTS Allergic to perfumes and cleaning products  . Ace Inhibitors Cough       .  Aspirin Nausea Only    Social History   Socioeconomic History  . Marital status: Married    Spouse name: Not on file  . Number of children: 2  . Years of education: Not on file  . Highest education level: Not on file  Social Needs  . Financial resource strain: Not on file  . Food insecurity - worry: Not on file  . Food insecurity - inability: Not on file  . Transportation needs - medical: Not on file  . Transportation needs - non-medical: Not on file  Occupational History  . Occupation: unemployed    Fish farm manager: UNEMPLOYED  Tobacco Use  . Smoking status: Current Some Day Smoker    Packs/day: 0.25    Years: 32.00    Pack years: 8.00    Types: Cigarettes  . Smokeless tobacco: Never Used  Substance and Sexual Activity  . Alcohol use: No  . Drug use: No  . Sexual activity: Yes    Birth control/protection: Surgical  Other Topics Concern  . Not on file  Social History Narrative  . Not on file    Family History  Problem Relation Age of Onset  . Hypertension Father   . Cancer  Father   . Heart disease Mother   . Asthma Son        had as a child  . Heart disease Sister   . Breast cancer Sister   . Hypertension Brother     Past Surgical History:  Procedure Laterality Date  . CHOLECYSTECTOMY    . COLONOSCOPY    . ENDOMETRIAL ABLATION  10/2010  . HERNIA REPAIR     umbicial hernia  . KNEE CLOSED REDUCTION Right 12/06/2015   Procedure: CLOSED MANIPULATION RIGHT KNEE;  Surgeon: [name], MD;  Location: [facility];  Service: Orthopedics;  Laterality: Right;  . KNEE CLOSED REDUCTION Right 01/17/2016   Procedure: CLOSED MANIPULATION RIGHT KNEE;  Surgeon: [name], MD;  Location: [facility];  Service: Orthopedics;  Laterality: Right;  . KNEE JOINT MANIPULATION Right 12/06/2015  . LUMBAR DISC SURGERY  06/03/2015   L 2  L3 L4 L5   . LUMBAR LAMINECTOMY/DECOMPRESSION MICRODISCECTOMY N/A 06/03/2015   Procedure: Bilateral lateral recess decompression L2-3, L3-4, L4-5;  Surgeon: [name], MD;  Location: [facility];  Service: Orthopedics;  Laterality: N/A;  . LUMBAR LAMINECTOMY/DECOMPRESSION MICRODISCECTOMY N/A 10/02/2016   Procedure: Right L5-S1 Lateral Recess Decompression  microdiscectomy;  Surgeon: [name], MD;  Location: [facility];  Service: Orthopedics;  Laterality: N/A;  . svd      x 2  . TOTAL KNEE ARTHROPLASTY Right 09/06/2015   Procedure: RIGHT TOTAL KNEE ARTHROPLASTY;  Surgeon: [name], MD;  Location: [place] [facility];  Service: Orthopedics;  Laterality: Right;  . TUBAL LIGATION    . UPPER GASTROINTESTINAL ENDOSCOPY  04/28/11    ROS: Review of Systems Negative except as stated above PHYSICAL EXAM: BP (!) 155/91   Pulse 64   Temp 98.3 F (36.8 C) (Oral)   Resp 16   Ht 5' 5"  (1.651 m)   Wt 166 lb 3.2 oz (75.4 kg)   SpO2 98%   BMI 27.66 kg/m   Wt Readings from Last 3 Encounters:  04/01/17 166 lb 3.2 oz (75.4 kg)  03/05/17 167 lb (75.8 kg)  03/03/17 167 lb 3.2 oz (75.8 kg)  Repeat BP 140/78  Physical Exam  General appearance - alert, well appearing, and  in no distress Mental status - alert, oriented to person,  place, and time, normal mood, behavior, speech, dress, motor activity, and thought processes Neck - supple, no significant adenopathy Chest - clear to auscultation, no wheezes, rales or rhonchi, symmetric air entry Heart - normal rate, regular rhythm, normal S1, S2, no murmurs, rubs, clicks or gallops Extremities - peripheral pulses normal, no pedal edema, no clubbing or cyanosis  Lab Results  Component Value Date   HGBA1C 6.6 03/03/2017   Lab Results  Component Value Date   WBC 5.1 03/05/2017   HGB 12.0 03/05/2017   HCT 34.9 (L) 03/05/2017   MCV 73.9 (L) 03/05/2017   PLT 292 03/05/2017     Chemistry      Component Value Date/Time   NA 136 03/05/2017 2005   NA 140 11/23/2016 0956   K 3.9 03/05/2017 2005   CL 97 (L) 03/05/2017 2005   CO2 28 03/05/2017 2005   BUN 17 03/05/2017 2005   BUN 11 11/23/2016 0956   CREATININE 0.87 03/05/2017 2005   CREATININE 0.90 03/11/2016 1556      Component Value Date/Time   CALCIUM 9.7 03/05/2017 2005   ALKPHOS 103 03/05/2017 2005   AST 29 03/05/2017 2005   ALT 26 03/05/2017 2005   BILITOT 0.5 03/05/2017 2005   BILITOT 0.3 11/23/2016 0956      ASSESSMENT AND PLAN: 1. Tobacco use disorder Strongly encouraged smoking cessation.  Encourage consistent use of patches  2. Essential hypertension Repeat blood pressure today is better.  She confirms that she is on HCTZ 25 mg daily and Diovan/HCTZ.  Salt restriction encouraged  3. Chronic obstructive pulmonary disease, unspecified COPD type ([place]) Stable.  Smoking cessation encouraged  4. Primary osteoarthritis of left knee Encourage regular aerobic activity as tolerated.  She is followed by Ortho.  She is on Celebrex.  5. Insomnia, unspecified type Good sleep hygiene encouraged.  She is on hydroxyzine 10 mg 3 times daily as needed.  I recommend increasing the evening dose to 20 mg as needed.  New prescription to be sent to her  pharmacy   Patient was given the opportunity to ask questions.  Patient verbalized understanding of the plan and was able to repeat key elements of the plan.   No orders of the defined types were placed in this encounter.    Requested Prescriptions    No prescriptions requested or ordered in this encounter    Return in about 3 months (around 06/29/2017).  Karle Plumber, MD, FACP

## 2017-04-02 LAB — GLUCOSE, POCT (MANUAL RESULT ENTRY): POC Glucose: 96 mg/dl (ref 70–99)

## 2017-04-02 MED ORDER — HYDROXYZINE HCL 10 MG PO TABS: ORAL_TABLET | ORAL | 3 refills | Status: DC

## 2017-04-07 ENCOUNTER — Ambulatory Visit (INDEPENDENT_AMBULATORY_CARE_PROVIDER_SITE_OTHER): Payer: Medicaid Other

## 2017-04-07 ENCOUNTER — Encounter (INDEPENDENT_AMBULATORY_CARE_PROVIDER_SITE_OTHER): Payer: Self-pay | Admitting: Specialist

## 2017-04-07 ENCOUNTER — Ambulatory Visit (INDEPENDENT_AMBULATORY_CARE_PROVIDER_SITE_OTHER): Payer: Medicaid Other | Admitting: Specialist

## 2017-04-07 VITALS — BP 115/76 | HR 64 | Ht 66.0 in | Wt 166.0 lb

## 2017-04-07 DIAGNOSIS — M1712 Unilateral primary osteoarthritis, left knee: Secondary | ICD-10-CM | POA: Diagnosis not present

## 2017-04-07 DIAGNOSIS — M25562 Pain in left knee: Secondary | ICD-10-CM

## 2017-04-07 DIAGNOSIS — G8929 Other chronic pain: Secondary | ICD-10-CM

## 2017-04-07 MED ORDER — CELECOXIB 200 MG PO CAPS: 200.0000 mg | ORAL_CAPSULE | Freq: Two times a day (BID) | ORAL | 3 refills | Status: DC

## 2017-04-07 NOTE — Progress Notes (Signed)
Office Visit Note   Patient: Stacy [name]           Date of Birth: 1961/01/29           MRN: [id number] Visit Date: 04/07/2017              Requested by: Quentin AngstJegede, Olugbemiga E, MD [address] [place], [place] [address] PCP: Marcine MatarJohnson, Deborah B, MD   Assessment & Plan: Visit Diagnoses:  1. Unilateral primary osteoarthritis, left knee   2. Chronic pain of left knee     Plan:The main ways of treat osteoarthritis, that are found to be success. Weight loss helps to decrease pain. Exercise is important to maintaining cartilage and thickness and strengthening. NSAIDs like motrin, tylenol, alleve are meds decreasing the inflamation. Ice is okay  In afternoon and evening and hot shower in the am MRI of the left knee ordered due to failure of conservative management. Celebrex and exercieses that are straight raisw and the last couple of degrees of bending And straightening of the knee.  Follow-Up Instructions: Return in about 4 weeks (around 05/05/2017).   Orders:  Orders Placed This Encounter  Procedures  . XR KNEE 3 VIEW LEFT   No orders of the defined types were placed in this encounter.     Procedures: No procedures performed   Clinical Data: No additional findings.   Subjective: Chief Complaint  Patient presents with  . Lower Back - Pain, Follow-up    57  year old female with history of right knee arthroplasty with arthrofibrosis in 01/2016 and lumbar right L5-S1 lateral recess decompression for lateral recess stenosis. She was seen in 01/2017 and had left knee intraarticular injection with steriod and marcaine. She is seeing foot surgeon for evaluation of neuropathy in her feet and he performed injections. She also has  Had hearing tested and is told she will need to have hearing aids. Pain in the right knee is not hurting that much, pain or two. The left knee is the main cause of discomfort.      Review of Systems   Objective: Vital Signs: BP 115/76    Pulse 64   Ht 5\' 6"  (1.676 m)   Wt 166 lb (75.3 kg)   BMI 26.79 kg/m   Physical Exam  Ortho Exam  Specialty Comments:  No specialty comments available.  Imaging: No results found.   PMFS History: Patient Active Problem List   Diagnosis Date Noted  . Herniation of lumbar intervertebral disc with radiculopathy 10/02/2016    Priority: High    Class: Chronic  . Neurogenic claudication due to lumbar spinal stenosis 06/03/2015    Priority: High    Class: Chronic  . Chondromalacia of both patellae 06/03/2015    Priority: High    Class: Chronic  . Acute sinusitis 03/03/2017  . Hearing difficulty of both ears 03/03/2017  . 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 Status post laminectomy 10/02/2016  . Alopecia areata 08/19/2016  . Gastroenteritis 03/11/2016  . postop Arthrofibrosis of knee joint, right 11/29/2015  . Pap smear for cervical cancer screening 10/24/2015  . Healthcare maintenance 10/24/2015  . Osteoarthritis of right knee 09/06/2015  . Type 2 diabetes mellitus without complication, without long-term current use of insulin (HCC) 06/13/2015  . Spinal stenosis, lumbar region, with neurogenic claudication 06/03/2015  . Tobacco use disorder 04/25/2015  . DJD (degenerative joint disease) of knee 01/04/2015  . Falls frequently 11/29/2014  . Hemorrhoid 11/14/2014  .  Bilateral knee pain 07/31/2014  . Asthma, chronic 07/19/2014  . Gout of big toe 07/19/2014  . Midline low back pain with right-sided sciatica 01/01/2014  . Bilateral low back pain without sciatica 08/14/2013  . Essential hypertension 08/14/2013  . Gastroesophageal reflux disease without esophagitis 08/14/2013  . COPD exacerbation (HCC) 08/14/2013  . Colon cancer screening 08/14/2013  . Low back pain 05/15/2013  . Preventative health care 05/15/2013  . GERD (gastroesophageal reflux disease) 02/16/2013  . DM (diabetes mellitus) (HCC) 02/16/2013  . COPD (chronic  obstructive pulmonary disease) (HCC) 04/17/2011  . Hypertension 04/17/2011  . Cough 04/16/2011   Past Medical History:  Diagnosis Date  . Arthritis   . 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 Asthma   . COPD (chronic obstructive pulmonary disease) (HCC)   . Diabetes mellitus    Type II  . GERD (gastroesophageal reflux disease)    Pt on Protonix daily  . Glaucoma   . Gout   . Headache(784.0)    otc meds prn  . Hyperlipidemia   . Hypertension    Pt on lisinopril  . Irritable bowel syndrome 11/19/2010  . Neuropathy   . Pneumonia   . Restless legs   . Shortness of breath    occasional - uses breathing tx at home    Family History  Problem Relation Age of Onset  . Hypertension Father   . Cancer Father   . Heart disease Mother   . Asthma Son        had as a child  . Heart disease Sister   . Breast cancer Sister   . Hypertension Brother     Past Surgical History:  Procedure Laterality Date  . CHOLECYSTECTOMY    . COLONOSCOPY    . ENDOMETRIAL ABLATION  10/2010  . HERNIA REPAIR     umbicial hernia  . KNEE CLOSED REDUCTION Right 12/06/2015   Procedure: CLOSED MANIPULATION RIGHT KNEE;  Surgeon: Kerrin Champagne, MD;  Location: [facility] OR;  Service: Orthopedics;  Laterality: Right;  . KNEE CLOSED REDUCTION Right 01/17/2016   Procedure: CLOSED MANIPULATION RIGHT KNEE;  Surgeon: Kerrin Champagne, MD;  Location: [facility] OR;  Service: Orthopedics;  Laterality: Right;  . KNEE JOINT MANIPULATION Right 12/06/2015  . LUMBAR DISC SURGERY  06/03/2015   L 2  L3 L4 L5   . LUMBAR LAMINECTOMY/DECOMPRESSION MICRODISCECTOMY N/A 06/03/2015   Procedure: Bilateral lateral recess decompression L2-3, L3-4, L4-5;  Surgeon: Kerrin Champagne, MD;  Location: [facility] OR;  Service: Orthopedics;  Laterality: N/A;  . LUMBAR LAMINECTOMY/DECOMPRESSION MICRODISCECTOMY N/A 10/02/2016   Procedure: Right L5-S1 Lateral Recess Decompression  microdiscectomy;  Surgeon: Kerrin Champagne, MD;  Location: [facility] OR;  Service:  Orthopedics;  Laterality: N/A;  . svd      x 2  . TOTAL KNEE ARTHROPLASTY Right 09/06/2015   Procedure: RIGHT TOTAL KNEE ARTHROPLASTY;  Surgeon: Kerrin Champagne, MD;  Location: [facility] OR;  Service: Orthopedics;  Laterality: Right;  . TUBAL LIGATION    . UPPER GASTROINTESTINAL ENDOSCOPY  04/28/11   Social History   Occupational History  . Occupation: unemployed    Associate Professor: UNEMPLOYED  Tobacco Use  . Smoking status: Current Some Day Smoker    Packs/day: 0.25    Years: 32.00    Pack years: 8.00    Types: Cigarettes  . Smokeless tobacco: Never Used  Substance and Sexual Activity  . Alcohol use: No  . Drug use: No  . Sexual activity: Yes  Birth control/protection: Surgical

## 2017-04-07 NOTE — Patient Instructions (Addendum)
The main ways of treat osteoarthritis, that are found to be success. Weight loss helps to decrease pain. Exercise is important to maintaining cartilage and thickness and strengthening. NSAIDs like motrin, tylenol, alleve are meds decreasing the inflamation. Ice is okay  In afternoon and evening and hot shower in the am MRI of the left knee ordered due to failure of conservative management. Celebrex and exercieses that are straight raisw and the last couple of degrees of bending And straightening of the knee.

## 2017-04-08 DIAGNOSIS — J3089 Other allergic rhinitis: Secondary | ICD-10-CM | POA: Insufficient documentation

## 2017-04-08 DIAGNOSIS — H903 Sensorineural hearing loss, bilateral: Secondary | ICD-10-CM | POA: Insufficient documentation

## 2017-04-20 ENCOUNTER — Telehealth: Payer: Self-pay | Admitting: Internal Medicine

## 2017-04-20 DIAGNOSIS — J329 Chronic sinusitis, unspecified: Secondary | ICD-10-CM

## 2017-04-20 NOTE — Telephone Encounter (Signed)
Patient called and stated at her visit with her Ear nose and throat Dr.Byers he suggested that she be sent to [facility]. Please fu.

## 2017-04-24 ENCOUNTER — Ambulatory Visit
Admission: RE | Admit: 2017-04-24 | Discharge: 2017-04-24 | Disposition: A | Discharge status: Home / Self Care | Payer: Medicaid Other | Source: Ambulatory Visit | Attending: Specialist | Admitting: Specialist

## 2017-04-24 DIAGNOSIS — M25562 Pain in left knee: Principal | ICD-10-CM

## 2017-04-24 DIAGNOSIS — G8929 Other chronic pain: Secondary | ICD-10-CM

## 2017-04-29 ENCOUNTER — Other Ambulatory Visit: Payer: Self-pay | Admitting: Internal Medicine

## 2017-05-10 ENCOUNTER — Encounter (INDEPENDENT_AMBULATORY_CARE_PROVIDER_SITE_OTHER): Payer: Self-pay | Admitting: Specialist

## 2017-05-10 ENCOUNTER — Ambulatory Visit: Payer: Self-pay | Admitting: Podiatry

## 2017-05-10 ENCOUNTER — Ambulatory Visit (INDEPENDENT_AMBULATORY_CARE_PROVIDER_SITE_OTHER): Payer: Medicaid Other | Admitting: Specialist

## 2017-05-10 VITALS — BP 116/77 | HR 81 | Ht 66.0 in | Wt 166.0 lb

## 2017-05-10 DIAGNOSIS — B351 Tinea unguium: Secondary | ICD-10-CM | POA: Diagnosis not present

## 2017-05-10 DIAGNOSIS — M722 Plantar fascial fibromatosis: Secondary | ICD-10-CM | POA: Diagnosis not present

## 2017-05-10 DIAGNOSIS — G8929 Other chronic pain: Secondary | ICD-10-CM | POA: Diagnosis not present

## 2017-05-10 DIAGNOSIS — M79676 Pain in unspecified toe(s): Secondary | ICD-10-CM

## 2017-05-10 DIAGNOSIS — Z96651 Presence of right artificial knee joint: Secondary | ICD-10-CM | POA: Diagnosis not present

## 2017-05-10 DIAGNOSIS — M25562 Pain in left knee: Secondary | ICD-10-CM

## 2017-05-10 DIAGNOSIS — M222X2 Patellofemoral disorders, left knee: Secondary | ICD-10-CM

## 2017-05-10 DIAGNOSIS — E1142 Type 2 diabetes mellitus with diabetic polyneuropathy: Secondary | ICD-10-CM

## 2017-05-10 MED ORDER — ACETAMINOPHEN-CODEINE #3 300-30 MG PO TABS: 1.0000 | ORAL_TABLET | ORAL | 0 refills | Status: DC | PRN

## 2017-05-10 MED ORDER — CELECOXIB 200 MG PO CAPS: 200.0000 mg | ORAL_CAPSULE | Freq: Two times a day (BID) | ORAL | 3 refills | Status: DC

## 2017-05-10 NOTE — Progress Notes (Signed)
Office Visit Note   Patient: Stacy [name]           Date of Birth: [birth year]           MRN: [id number] Visit Date: 05/10/2017              Requested by: Marcine Matar, MD [address] [place], [place] [address] PCP: Marcine Matar, MD   Assessment & Plan: Visit Diagnoses:  1. Chronic pain of left knee   2. Patellofemoral pain syndrome of left knee     Plan: The main ways of treat osteoarthritis, that are found to be success. Weight loss helps to decrease pain. Exercise is important to maintaining cartilage and thickness and strengthening. NSAIDs like motrin, tylenol, celebrex are meds decreasing the inflamation. Ice is okay  In afternoon and evening and hot shower in the am Terminal quadriceps strengthening exercises.   Follow-Up Instructions: Return in about 2 weeks (around 05/24/2017) for Dr. August Saucer to evaluate left knee patellofemoral  .   Orders:  No orders of the defined types were placed in this encounter.  No orders of the defined types were placed in this encounter.     Procedures: No procedures performed   Clinical Data: Findings:  There is no meniscal injury, cystic changes noted at the area of the posterior cruciate insertion into the tibia. There is partial thichness wear of the lateral facet of the patelle, and also degenerative changes of the medial femoral condyle at the distal femoral groove. Minimal effusion.     Subjective: Chief Complaint  Patient presents with  . Left Knee - Follow-up    MRI Review--Left Knee     56 year olld female status post right TKR 09/2015 and then manipulation twice in 11/3 and 12/15. The manipulations were for severe pain and arthrofibrosis. Her surgery was for severe patellofemoral osteoarthritis. Now she is having left sided knee pain that is worsening, with bending, squatting, kneeling and she is having a popping sensation in the left knee. She has undergone previous P-F exercises and right TKR and now the  left knee is having increasing symptoms. Underwent injection with cortisone without improvement. MRI was done.   Review of Systems  Constitutional: Negative.   HENT: Positive for congestion, rhinorrhea, sinus pressure, sinus pain and sneezing.   Eyes: Positive for pain, redness and itching.  Respiratory: Positive for wheezing. Negative for cough, choking, chest tightness, shortness of breath and stridor.   Cardiovascular: Negative for chest pain, palpitations and leg swelling.  Gastrointestinal: Negative.  Negative for abdominal distention, abdominal pain, anal bleeding, blood in stool, constipation, diarrhea, nausea, rectal pain and vomiting.  Endocrine: Negative.  Negative for cold intolerance, heat intolerance, polydipsia, polyphagia and polyuria.  Genitourinary: Negative.  Negative for difficulty urinating, dyspareunia, dysuria, enuresis, flank pain, frequency, genital sores, hematuria, menstrual problem and urgency.  Musculoskeletal: Positive for back pain. Negative for gait problem and joint swelling.  Skin: Negative.  Negative for color change, pallor, rash and wound.  Allergic/Immunologic: Negative.  Negative for environmental allergies, food allergies and immunocompromised state.  Neurological: Positive for weakness and numbness. Negative for dizziness, tremors, seizures, syncope, facial asymmetry, speech difficulty, light-headedness and headaches.  Hematological: Negative.  Negative for adenopathy. Does not bruise/bleed easily.  Psychiatric/Behavioral: Negative.  Negative for agitation, behavioral problems, confusion, decreased concentration, dysphoric mood, hallucinations, self-injury, sleep disturbance and suicidal ideas. The patient is not nervous/anxious and is not hyperactive.      Objective: Vital Signs: BP 116/77 (BP Location:  Left Arm, Patient Position: Sitting)   Pulse 81   Ht 5\' 6"  (1.676 m)   Wt 166 lb (75.3 kg)   BMI 26.79 kg/m   Physical Exam  Constitutional: She  is oriented to person, place, and time. She appears well-developed and well-nourished.  HENT:  Head: Normocephalic and atraumatic.  Eyes: Pupils are equal, round, and reactive to light. EOM are normal.  Neck: Normal range of motion. Neck supple.  Pulmonary/Chest: Effort normal and breath sounds normal.  Abdominal: Soft. Bowel sounds are normal.  Musculoskeletal: Normal range of motion.  Neurological: She is alert and oriented to person, place, and time.  Skin: Skin is warm and dry.  Psychiatric: She has a normal mood and affect. Her behavior is normal. Judgment and thought content normal.    Back Exam   Tenderness  The patient is experiencing tenderness in the lumbar.      Specialty Comments:  No specialty comments available.  Imaging: No results found.   PMFS History: Patient Active Problem List   Diagnosis Date Noted  . Herniation of lumbar intervertebral disc with radiculopathy 10/02/2016    Priority: High    Class: Chronic  . Neurogenic claudication due to lumbar spinal stenosis 06/03/2015    Priority: High    Class: Chronic  . Chondromalacia of both patellae 06/03/2015    Priority: High    Class: Chronic  . Acute sinusitis 03/03/2017  . Hearing difficulty of both ears 03/03/2017  . 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 Status post laminectomy 10/02/2016  . Alopecia areata 08/19/2016  . Gastroenteritis 03/11/2016  . postop Arthrofibrosis of knee joint, right 11/29/2015  . Pap smear for cervical cancer screening 10/24/2015  . Healthcare maintenance 10/24/2015  . Osteoarthritis of right knee 09/06/2015  . Type 2 diabetes mellitus without complication, without long-term current use of insulin (HCC) 06/13/2015  . Spinal stenosis, lumbar region, with neurogenic claudication 06/03/2015  . Tobacco use disorder 04/25/2015  . DJD (degenerative joint disease) of knee 01/04/2015  . Falls frequently 11/29/2014  . Hemorrhoid  11/14/2014  . Bilateral knee pain 07/31/2014  . Asthma, chronic 07/19/2014  . Gout of big toe 07/19/2014  . Midline low back pain with right-sided sciatica 01/01/2014  . Bilateral low back pain without sciatica 08/14/2013  . Essential hypertension 08/14/2013  . Gastroesophageal reflux disease without esophagitis 08/14/2013  . COPD exacerbation (HCC) 08/14/2013  . Colon cancer screening 08/14/2013  . Low back pain 05/15/2013  . Preventative health care 05/15/2013  . GERD (gastroesophageal reflux disease) 02/16/2013  . DM (diabetes mellitus) (HCC) 02/16/2013  . COPD (chronic obstructive pulmonary disease) (HCC) 04/17/2011  . Hypertension 04/17/2011  . Cough 04/16/2011   Past Medical History:  Diagnosis Date  . Arthritis   . 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 Asthma   . COPD (chronic obstructive pulmonary disease) (HCC)   . Diabetes mellitus    Type II  . GERD (gastroesophageal reflux disease)    Pt on Protonix daily  . Glaucoma   . Gout   . Headache(784.0)    otc meds prn  . Hyperlipidemia   . Hypertension    Pt on lisinopril  . Irritable bowel syndrome 11/19/2010  . Neuropathy   . Pneumonia   . Restless legs   . Shortness of breath    occasional - uses breathing tx at home    Family History  Problem Relation Age of Onset  . Hypertension  Father   . Cancer Father   . Heart disease Mother   . Asthma Son        had as a child  . Heart disease Sister   . Breast cancer Sister   . Hypertension Brother     Past Surgical History:  Procedure Laterality Date  . CHOLECYSTECTOMY    . COLONOSCOPY    . ENDOMETRIAL ABLATION  10/2010  . HERNIA REPAIR     umbicial hernia  . KNEE CLOSED REDUCTION Right 12/06/2015   Procedure: CLOSED MANIPULATION RIGHT KNEE;  Surgeon: Kerrin Champagne, MD;  Location: [facility] OR;  Service: Orthopedics;  Laterality: Right;  . KNEE CLOSED REDUCTION Right 01/17/2016   Procedure: CLOSED MANIPULATION RIGHT KNEE;  Surgeon: Kerrin Champagne, MD;  Location: [facility] OR;  Service: Orthopedics;  Laterality: Right;  . KNEE JOINT MANIPULATION Right 12/06/2015  . LUMBAR DISC SURGERY  06/03/2015   L 2  L3 L4 L5   . LUMBAR LAMINECTOMY/DECOMPRESSION MICRODISCECTOMY N/A 06/03/2015   Procedure: Bilateral lateral recess decompression L2-3, L3-4, L4-5;  Surgeon: Kerrin Champagne, MD;  Location: [facility] OR;  Service: Orthopedics;  Laterality: N/A;  . LUMBAR LAMINECTOMY/DECOMPRESSION MICRODISCECTOMY N/A 10/02/2016   Procedure: Right L5-S1 Lateral Recess Decompression  microdiscectomy;  Surgeon: Kerrin Champagne, MD;  [facility];  Service: Orthopedics;  Laterality: N/A;  . svd      x 2  . TOTAL KNEE ARTHROPLASTY Right 09/06/2015   Procedure: RIGHT TOTAL KNEE ARTHROPLASTY;  Surgeon: Kerrin Champagne, MD;  Location: [facility] OR;  Service: Orthopedics;  Laterality: Right;  . TUBAL LIGATION    . UPPER GASTROINTESTINAL ENDOSCOPY  04/28/11   Social History   Occupational History  . Occupation: unemployed    Associate Professor: UNEMPLOYED  Tobacco Use  . Smoking status: Current Some Day Smoker    Packs/day: 0.25    Years: 32.00    Pack years: 8.00    Types: Cigarettes  . Smokeless tobacco: Never Used  Substance and Sexual Activity  . Alcohol use: No  . Drug use: No  . Sexual activity: Yes    Birth control/protection: Surgical

## 2017-05-10 NOTE — Patient Instructions (Addendum)
The main ways of treat osteoarthritis, that are found to be success. Weight loss helps to decrease pain. Exercise is important to maintaining cartilage and thickness and strengthening. NSAIDs like motrin, tylenol, celebrex  are meds decreasing the inflamation. Ice is okay  In afternoon and evening and hot shower in the am Terminal quadriceps strengthening exercises.

## 2017-05-12 NOTE — Progress Notes (Signed)
   SUBJECTIVE Patient with a history of diabetes mellitus presents to office today complaining of elongated, thickened nails that cause pain while ambulating in shoes. She is unable to trim her own nails. She also complains of pain to the bilateral heels that began a few weeks ago. She has not done anything to treat the pain but is requesting injections for treatment. Walking and bearing weight for long periods of time increases the pain. Patient is here for further evaluation and treatment.   Past Medical History:  Diagnosis Date  . Arthritis   . 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 Asthma   . COPD (chronic obstructive pulmonary disease) (HCC)   . Diabetes mellitus    Type II  . GERD (gastroesophageal reflux disease)    Pt on Protonix daily  . Glaucoma   . Gout   . Headache(784.0)    otc meds prn  . Hyperlipidemia   . Hypertension    Pt on lisinopril  . Irritable bowel syndrome 11/19/2010  . Neuropathy   . Pneumonia   . Restless legs   . Shortness of breath    occasional - uses breathing tx at home    OBJECTIVE General Patient is awake, alert, and oriented x 3 and in no acute distress. Derm Skin is dry and supple bilateral. Negative open lesions or macerations. Remaining integument unremarkable. Nails are tender, long, thickened and dystrophic with subungual debris, consistent with onychomycosis, 1-5 bilateral. No signs of infection noted. Vasc  DP and PT pedal pulses palpable bilaterally. Temperature gradient within normal limits.  Neuro Epicritic and protective threshold sensation diminished bilaterally.  Musculoskeletal Exam Tenderness to palpation to the plantar aspect of the left heel along the plantar fascia. No symptomatic pedal deformities noted bilateral. Muscular strength within normal limits.  ASSESSMENT 1. Diabetes Mellitus w/ peripheral neuropathy 2. Onychomycosis of nail due to dermatophyte bilateral 3. Pain in foot bilateral 4. Plantar  fasciitis left  PLAN OF CARE 1. Patient evaluated today. 2. Instructed to maintain good pedal hygiene and foot care. Stressed importance of controlling blood sugar.  3. Mechanical debridement of nails 1-5 bilaterally performed using a nail nipper. Filed with dremel without incident.  4. Injection of 0.5cc Celestone soluspan injected into the left plantar fascia.  5. Return to clinic in 6 weeks with Dr. Charlsie Merlesegal.     Felecia ShellingBrent M. Tyeasha Ebbs, DPM [facility]  Dr. Felecia ShellingBrent M. Najmah Carradine, DPM    [address]                                        [place], [place] [address]                Office [phone]  Fax [phone]

## 2017-05-17 ENCOUNTER — Telehealth: Payer: Self-pay | Admitting: Internal Medicine

## 2017-05-17 DIAGNOSIS — J4540 Moderate persistent asthma, uncomplicated: Secondary | ICD-10-CM | POA: Diagnosis not present

## 2017-05-17 DIAGNOSIS — J449 Chronic obstructive pulmonary disease, unspecified: Secondary | ICD-10-CM | POA: Diagnosis not present

## 2017-05-17 DIAGNOSIS — J3081 Allergic rhinitis due to animal (cat) (dog) hair and dander: Secondary | ICD-10-CM | POA: Diagnosis not present

## 2017-05-17 DIAGNOSIS — J301 Allergic rhinitis due to pollen: Secondary | ICD-10-CM | POA: Diagnosis not present

## 2017-05-17 MED ORDER — EPINEPHRINE 0.3 MG/0.3ML IJ SOAJ: INTRAMUSCULAR | 1 refills | Status: DC

## 2017-05-17 NOTE — Telephone Encounter (Signed)
Patient called asking for a new epi-pen. Please send to [facility]

## 2017-05-17 NOTE — Telephone Encounter (Signed)
In pt chart last rx for epi-pen was 09/10/2014 by Dr. Hyman HopesJegede.   Will forward to pcp

## 2017-05-19 ENCOUNTER — Ambulatory Visit: Payer: Medicaid Other | Admitting: Internal Medicine

## 2017-05-24 ENCOUNTER — Ambulatory Visit (INDEPENDENT_AMBULATORY_CARE_PROVIDER_SITE_OTHER): Payer: Medicaid Other | Admitting: Orthopedic Surgery

## 2017-05-26 ENCOUNTER — Ambulatory Visit (INDEPENDENT_AMBULATORY_CARE_PROVIDER_SITE_OTHER): Payer: Medicaid Other | Admitting: Orthopedic Surgery

## 2017-05-26 ENCOUNTER — Encounter: Payer: Self-pay | Admitting: Internal Medicine

## 2017-05-26 ENCOUNTER — Encounter (INDEPENDENT_AMBULATORY_CARE_PROVIDER_SITE_OTHER): Payer: Self-pay | Admitting: Orthopedic Surgery

## 2017-05-26 DIAGNOSIS — M25562 Pain in left knee: Secondary | ICD-10-CM

## 2017-05-26 DIAGNOSIS — G8929 Other chronic pain: Secondary | ICD-10-CM | POA: Diagnosis not present

## 2017-05-26 NOTE — Progress Notes (Signed)
Patient seen by [facility] on 4- 15-2019.  Patient diagnosed with moderate persistent asthma, COPD, allergic rhinitis due to animal hair and dander, allergic rhinitis and allergy to other foods.  Patient was advised to continue Symbicort twice a day and albuterol nebulizer as needed.  Started on Zyrtec and Flonase nasal spray.  Also started on Singulair.

## 2017-05-29 ENCOUNTER — Encounter (INDEPENDENT_AMBULATORY_CARE_PROVIDER_SITE_OTHER): Payer: Self-pay | Admitting: Orthopedic Surgery

## 2017-05-29 NOTE — Progress Notes (Signed)
Office Visit Note   Patient: Stacy [name]           Date of Birth: [birth year]           MRN: [id number] Visit Date: 05/26/2017 Requested by: Marcine Matar, MD [address] [place], [place] [address] PCP: Marcine Matar, MD  Subjective: Chief Complaint  Patient presents with  . Left Knee - Pain    HPI: Kajah is a patient with left knee pain.  MRI scan on the current system is reviewed.  She does have a history of left knee arthroscopy.  He has mild patellofemoral arthritis otherwise normal.  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  This may have created her type appearance with possible loose chondral flaps based on its MRI appearance.  The menisci and joint surfaces otherwise look intact.  She reports constant pain primarily in the anterior aspect of the knee.  She has had an injection with Dr. [name] without much relief.  She had right total knee replacement in August 2017 with subsequent manipulation x2 in November and December.  She reports pain "underneath" the kneecap.              ROS:All systems reviewed are negative as they relate to the chief complaint within the history of present illness.  Patient denies  fevers or chills.   Assessment & Plan: Visit Diagnoses:  1. Chronic pain of left knee     Plan: Impression is left knee pain refractory to nonoperative management with primarily patellofemoral pathology present.  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  Most of Noha symptoms do localized to the anterior aspect of the knee; however, is explained to Audrionna in great detail that arthroscopy in her knee may or may not affect some pain relief.  I do not think that her knee will be pain-free after any type of arthroscopic procedure.  I think there is a chance based on that medial trochlear defect as well as symptoms that she is having that debridement of that area with microfracture and fill of the  defect with fibrocartilage may help some of her symptoms.  The risk and benefits of surgery including but not limited to infection stiffness incomplete or no pain relief are all discussed at length the [facility].  She really states that on dealing with the symptoms in her knee as they are.  This is not total knee replacement type pathology.  We will try arthroscopy and debridement primarily that anterior compartment to see if that will help.  Patient understands the risk and benefits.  All questions answered  Follow-Up Instructions: Return if symptoms worsen or fail to improve.   Orders:  No orders of the defined types were placed in this encounter.  No orders of the defined types were placed in this encounter.     Procedures: No procedures performed   Clinical Data: No additional findings.  Objective: Vital Signs: There were no vitals taken for this visit.  Physical Exam:   Constitutional: Patient appears well-developed HEENT:  Head: Normocephalic Eyes:EOM are normal Neck: Normal range of motion Cardiovascular: Normal rate Pulmonary/chest: Effort normal Neurologic: Patient is alert Skin: Skin is warm Psychiatric: Patient has normal mood and affect    Ortho Exam: Orthopedic exam demonstrates full active and passive range of motion of that left knee.  There is no effusion.  Mild patellofemoral crepitus is present.  Collateral and cruciate ligaments are  stable.  Extensor mechanism is nontender.  There is no joint line tenderness.  Pedal pulses palpable.  No groin pain with internal/external rotation of the leg.  Specialty Comments:  No specialty comments available.  Imaging: No results found.   PMFS History: Patient Active Problem List   Diagnosis Date Noted  . Acute sinusitis 03/03/2017  . Hearing difficulty of both ears 03/03/2017  . 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 Herniation of lumbar intervertebral disc with  radiculopathy 10/02/2016    Class: Chronic  . Status post laminectomy 10/02/2016  . Alopecia areata 08/19/2016  . Gastroenteritis 03/11/2016  . postop Arthrofibrosis of knee joint, right 11/29/2015  . Pap smear for cervical cancer screening 10/24/2015  . Healthcare maintenance 10/24/2015  . Osteoarthritis of right knee 09/06/2015  . Type 2 diabetes mellitus without complication, without long-term current use of insulin (HCC) 06/13/2015  . Neurogenic claudication due to lumbar spinal stenosis 06/03/2015    Class: Chronic  . Chondromalacia of both patellae 06/03/2015    Class: Chronic  . Spinal stenosis, lumbar region, with neurogenic claudication 06/03/2015  . Tobacco use disorder 04/25/2015  . DJD (degenerative joint disease) of knee 01/04/2015  . Falls frequently 11/29/2014  . Hemorrhoid 11/14/2014  . Bilateral knee pain 07/31/2014  . Asthma, chronic 07/19/2014  . Gout of big toe 07/19/2014  . Midline low back pain with right-sided sciatica 01/01/2014  . Bilateral low back pain without sciatica 08/14/2013  . Essential hypertension 08/14/2013  . Gastroesophageal reflux disease without esophagitis 08/14/2013  . COPD exacerbation (HCC) 08/14/2013  . Colon cancer screening 08/14/2013  . Low back pain 05/15/2013  . Preventative health care 05/15/2013  . GERD (gastroesophageal reflux disease) 02/16/2013  . DM (diabetes mellitus) (HCC) 02/16/2013  . COPD (chronic obstructive pulmonary disease) (HCC) 04/17/2011  . Hypertension 04/17/2011  . Cough 04/16/2011   Past Medical History:  Diagnosis Date  . Arthritis   . 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 Asthma   . COPD (chronic obstructive pulmonary disease) (HCC)   . Diabetes mellitus    Type II  . GERD (gastroesophageal reflux disease)    Pt on Protonix daily  . Glaucoma   . Gout   . Headache(784.0)    otc meds prn  . Hyperlipidemia   . Hypertension    Pt on lisinopril  . Irritable bowel syndrome  11/19/2010  . Neuropathy   . Pneumonia   . Restless legs   . Shortness of breath    occasional - uses breathing tx at home    Family History  Problem Relation Age of Onset  . Hypertension Father   . Cancer Father   . Heart disease Mother   . Asthma Son        had as a child  . Heart disease Sister   . Breast cancer Sister   . Hypertension Brother     Past Surgical History:  Procedure Laterality Date  . CHOLECYSTECTOMY    . COLONOSCOPY    . ENDOMETRIAL ABLATION  10/2010  . HERNIA REPAIR     umbicial hernia  . KNEE CLOSED REDUCTION Right 12/06/2015   Procedure: CLOSED MANIPULATION RIGHT KNEE;  Surgeon: Kerrin Champagne, MD;  Location: [facility] OR;  Service: Orthopedics;  Laterality: Right;  . KNEE CLOSED REDUCTION Right 01/17/2016   Procedure: CLOSED MANIPULATION RIGHT KNEE;  Surgeon: Kerrin Champagne, MD;  Location: [facility] OR;  Service: Orthopedics;  Laterality: Right;  .  KNEE JOINT MANIPULATION Right 12/06/2015  . LUMBAR DISC SURGERY  06/03/2015   L 2  L3 L4 L5   . LUMBAR LAMINECTOMY/DECOMPRESSION MICRODISCECTOMY N/A 06/03/2015   Procedure: Bilateral lateral recess decompression L2-3, L3-4, L4-5;  Surgeon: Kerrin Champagne, MD;  Location: [facility] OR;  Service: Orthopedics;  Laterality: N/A;  . LUMBAR LAMINECTOMY/DECOMPRESSION MICRODISCECTOMY N/A 10/02/2016   Procedure: Right L5-S1 Lateral Recess Decompression  microdiscectomy;  Surgeon: Kerrin Champagne, MD;  [facility];  Service: Orthopedics;  Laterality: N/A;  . svd      x 2  . TOTAL KNEE ARTHROPLASTY Right 09/06/2015   Procedure: RIGHT TOTAL KNEE ARTHROPLASTY;  Surgeon: Kerrin Champagne, MD;  Location: [facility] OR;  Service: Orthopedics;  Laterality: Right;  . TUBAL LIGATION    . UPPER GASTROINTESTINAL ENDOSCOPY  04/28/11   Social History   Occupational History  . Occupation: unemployed    Associate Professor: UNEMPLOYED  Tobacco Use  . Smoking status: Current Some Day Smoker    Packs/day: 0.25    Years: 32.00    Pack years: 8.00    Types: Cigarettes  .  Smokeless tobacco: Never Used  Substance and Sexual Activity  . Alcohol use: No  . Drug use: No  . Sexual activity: Yes    Birth control/protection: Surgical

## 2017-06-07 ENCOUNTER — Encounter (INDEPENDENT_AMBULATORY_CARE_PROVIDER_SITE_OTHER): Payer: Self-pay | Admitting: Orthopedic Surgery

## 2017-06-07 ENCOUNTER — Telehealth: Payer: Self-pay

## 2017-06-07 DIAGNOSIS — M2242 Chondromalacia patellae, left knee: Secondary | ICD-10-CM | POA: Diagnosis not present

## 2017-06-07 NOTE — Telephone Encounter (Signed)
Call received from [facility], RN/P4CC.  She stated that she was with the patient at the surgical center and was inquiring if the patient would be able to get a new wheelchair.  She then noted that she will check with the provider at the surgical center. She said that the patient has a wheelchair but it doesn't have leg rests and the patient is having a left TKR.

## 2017-06-14 ENCOUNTER — Encounter (INDEPENDENT_AMBULATORY_CARE_PROVIDER_SITE_OTHER): Payer: Self-pay | Admitting: Orthopedic Surgery

## 2017-06-14 ENCOUNTER — Ambulatory Visit (INDEPENDENT_AMBULATORY_CARE_PROVIDER_SITE_OTHER): Payer: Medicaid Other | Admitting: Orthopedic Surgery

## 2017-06-14 DIAGNOSIS — M25562 Pain in left knee: Secondary | ICD-10-CM

## 2017-06-15 ENCOUNTER — Encounter (INDEPENDENT_AMBULATORY_CARE_PROVIDER_SITE_OTHER): Payer: Self-pay | Admitting: Orthopedic Surgery

## 2017-06-15 NOTE — Progress Notes (Signed)
Post-Op Visit Note   Patient: Stacy [name]           Date of Birth: 1960-04-12           MRN: [id number] Visit Date: 06/14/2017 PCP: Marcine Matar, MD   Assessment & Plan:  Chief Complaint:  Chief Complaint  Patient presents with  . Left Knee - Routine Post Op   Visit Diagnoses:  1. Left knee pain, unspecified chronicity     Plan: Stacy [name] is a patient who is now a week out left knee arthroscopy with debridement of chondral overgrowth on the medial trochlear region.  On examination portal incisions are intact trace effusion is present range of motion is past 90.  Plan is left knee physical therapy for range of motion and strengthening 1 time a week for 6 weeks I will see her back in 4 weeks for final check.  Follow-Up Instructions: Return in about 1 month (around 07/12/2017).   Orders:  Orders Placed This Encounter  Procedures  . Ambulatory referral to Physical Therapy   No orders of the defined types were placed in this encounter.   Imaging: No results found.  PMFS History: Patient Active Problem List   Diagnosis Date Noted  . Acute sinusitis 03/03/2017  . Hearing difficulty of both ears 03/03/2017  . 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 Herniation of lumbar intervertebral disc with radiculopathy 10/02/2016    Class: Chronic  . Status post laminectomy 10/02/2016  . Alopecia areata 08/19/2016  . Gastroenteritis 03/11/2016  . postop Arthrofibrosis of knee joint, right 11/29/2015  . Pap smear for cervical cancer screening 10/24/2015  . Healthcare maintenance 10/24/2015  . Osteoarthritis of right knee 09/06/2015  . Type 2 diabetes mellitus without complication, without long-term current use of insulin (HCC) 06/13/2015  . Neurogenic claudication due to lumbar spinal stenosis 06/03/2015    Class: Chronic  . Chondromalacia of both patellae 06/03/2015    Class: Chronic  . Spinal stenosis, lumbar region, with neurogenic  claudication 06/03/2015  . Tobacco use disorder 04/25/2015  . DJD (degenerative joint disease) of knee 01/04/2015  . Falls frequently 11/29/2014  . Hemorrhoid 11/14/2014  . Bilateral knee pain 07/31/2014  . Asthma, chronic 07/19/2014  . Gout of big toe 07/19/2014  . Midline low back pain with right-sided sciatica 01/01/2014  . Bilateral low back pain without sciatica 08/14/2013  . Essential hypertension 08/14/2013  . Gastroesophageal reflux disease without esophagitis 08/14/2013  . COPD exacerbation (HCC) 08/14/2013  . Colon cancer screening 08/14/2013  . Low back pain 05/15/2013  . Preventative health care 05/15/2013  . GERD (gastroesophageal reflux disease) 02/16/2013  . DM (diabetes mellitus) (HCC) 02/16/2013  . COPD (chronic obstructive pulmonary disease) (HCC) 04/17/2011  . Hypertension 04/17/2011  . Cough 04/16/2011   Past Medical History:  Diagnosis Date  . Arthritis   . 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 Asthma   . COPD (chronic obstructive pulmonary disease) (HCC)   . Diabetes mellitus    Type II  . GERD (gastroesophageal reflux disease)    Pt on Protonix daily  . Glaucoma   . Gout   . Headache(784.0)    otc meds prn  . Hyperlipidemia   . Hypertension    Pt on lisinopril  . Irritable bowel syndrome 11/19/2010  . Neuropathy   . Pneumonia   . Restless legs   . Shortness of breath    occasional - uses breathing tx at  home    Family History  Problem Relation Age of Onset  . Hypertension Father   . Cancer Father   . Heart disease Mother   . Asthma Son        had as a child  . Heart disease Sister   . Breast cancer Sister   . Hypertension Brother     Past Surgical History:  Procedure Laterality Date  . CHOLECYSTECTOMY    . COLONOSCOPY    . ENDOMETRIAL ABLATION  10/2010  . HERNIA REPAIR     umbicial hernia  . KNEE CLOSED REDUCTION Right 12/06/2015   Procedure: CLOSED MANIPULATION RIGHT KNEE;  Surgeon: Kerrin Champagne, MD;   Location: [facility] OR;  Service: Orthopedics;  Laterality: Right;  . KNEE CLOSED REDUCTION Right 01/17/2016   Procedure: CLOSED MANIPULATION RIGHT KNEE;  Surgeon: Kerrin Champagne, MD;  Location: [facility] OR;  Service: Orthopedics;  Laterality: Right;  . KNEE JOINT MANIPULATION Right 12/06/2015  . LUMBAR DISC SURGERY  06/03/2015   L 2  L3 L4 L5   . LUMBAR LAMINECTOMY/DECOMPRESSION MICRODISCECTOMY N/A 06/03/2015   Procedure: Bilateral lateral recess decompression L2-3, L3-4, L4-5;  Surgeon: Kerrin Champagne, MD;  Location: [facility] OR;  Service: Orthopedics;  Laterality: N/A;  . LUMBAR LAMINECTOMY/DECOMPRESSION MICRODISCECTOMY N/A 10/02/2016   Procedure: Right L5-S1 Lateral Recess Decompression  microdiscectomy;  Surgeon: Kerrin Champagne, MD;  Location: [facility] OR;  Service: Orthopedics;  Laterality: N/A;  . svd      x 2  . TOTAL KNEE ARTHROPLASTY Right 09/06/2015   Procedure: RIGHT TOTAL KNEE ARTHROPLASTY;  Surgeon: Kerrin Champagne, MD;  Location: [facility] OR;  Service: Orthopedics;  Laterality: Right;  . TUBAL LIGATION    . UPPER GASTROINTESTINAL ENDOSCOPY  04/28/11   Social History   Occupational History  . Occupation: unemployed    Associate Professor: UNEMPLOYED  Tobacco Use  . Smoking status: Current Some Day Smoker    Packs/day: 0.25    Years: 32.00    Pack years: 8.00    Types: Cigarettes  . Smokeless tobacco: Never Used  Substance and Sexual Activity  . Alcohol use: No  . Drug use: No  . Sexual activity: Yes    Birth control/protection: Surgical

## 2017-06-25 ENCOUNTER — Encounter: Payer: Self-pay | Admitting: Physical Therapy

## 2017-06-25 ENCOUNTER — Ambulatory Visit: Payer: Medicaid Other | Attending: Orthopedic Surgery | Admitting: Physical Therapy

## 2017-06-25 DIAGNOSIS — R262 Difficulty in walking, not elsewhere classified: Secondary | ICD-10-CM | POA: Diagnosis present

## 2017-06-25 DIAGNOSIS — M25562 Pain in left knee: Secondary | ICD-10-CM | POA: Diagnosis not present

## 2017-06-25 DIAGNOSIS — M25662 Stiffness of left knee, not elsewhere classified: Secondary | ICD-10-CM | POA: Insufficient documentation

## 2017-06-25 DIAGNOSIS — R600 Localized edema: Secondary | ICD-10-CM | POA: Insufficient documentation

## 2017-06-25 NOTE — Therapy (Signed)
[facility] [address] [place], [place], [address] Phone: [phone]   Fax:  [phone]  Physical Therapy Evaluation  Patient Details  Name: Stacy [name] MRN: [id number] Date of Birth: 05/22/1960 Referring Provider: August Saucer   Encounter Date: 06/25/2017  PT End of Session - 06/25/17 0944    Visit Number  1    Number of Visits  4    Authorization Type  Medicaid    PT Start Time  0928    PT Stop Time  1016    PT Time Calculation (min)  48 min    Activity Tolerance  Patient tolerated treatment well    Behavior During Therapy  [facility] for tasks assessed/performed       Past Medical History:  Diagnosis Date  . Arthritis   . 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 Asthma   . COPD (chronic obstructive pulmonary disease) (HCC)   . Diabetes mellitus    Type II  . GERD (gastroesophageal reflux disease)    Pt on Protonix daily  . Glaucoma   . Gout   . Headache(784.0)    otc meds prn  . Hyperlipidemia   . Hypertension    Pt on lisinopril  . Irritable bowel syndrome 11/19/2010  . Neuropathy   . Pneumonia   . Restless legs   . Shortness of breath    occasional - uses breathing tx at home    Past Surgical History:  Procedure Laterality Date  . CHOLECYSTECTOMY    . COLONOSCOPY    . ENDOMETRIAL ABLATION  10/2010  . HERNIA REPAIR     umbicial hernia  . KNEE CLOSED REDUCTION Right 12/06/2015   Procedure: CLOSED MANIPULATION RIGHT KNEE;  Surgeon: Kerrin Champagne, MD;  Location: [facility] OR;  Service: Orthopedics;  Laterality: Right;  . KNEE CLOSED REDUCTION Right 01/17/2016   Procedure: CLOSED MANIPULATION RIGHT KNEE;  Surgeon: Kerrin Champagne, MD;  Location: [facility] OR;  Service: Orthopedics;  Laterality: Right;  . KNEE JOINT MANIPULATION Right 12/06/2015  . LUMBAR DISC SURGERY  06/03/2015   L 2  L3 L4 L5   . LUMBAR LAMINECTOMY/DECOMPRESSION MICRODISCECTOMY N/A 06/03/2015   Procedure: Bilateral lateral recess  decompression L2-3, L3-4, L4-5;  Surgeon: Kerrin Champagne, MD;  Location: [facility] OR;  Service: Orthopedics;  Laterality: N/A;  . LUMBAR LAMINECTOMY/DECOMPRESSION MICRODISCECTOMY N/A 10/02/2016   Procedure: Right L5-S1 Lateral Recess Decompression  microdiscectomy;  Surgeon: Kerrin Champagne, MD;  Location: [facility] OR;  Service: Orthopedics;  Laterality: N/A;  . svd      x 2  . TOTAL KNEE ARTHROPLASTY Right 09/06/2015   Procedure: RIGHT TOTAL KNEE ARTHROPLASTY;  Surgeon: Kerrin Champagne, MD;  Location: [facility] OR;  Service: Orthopedics;  Laterality: Right;  . TUBAL LIGATION    . UPPER GASTROINTESTINAL ENDOSCOPY  04/28/11    There were no vitals filed for this visit.   Subjective Assessment - 06/25/17 0931    Subjective  Patient left knee pain over the past few years, she reports that she had an left knee scope "to clean it up" on May 6th.  Since the surgery she has not done any exercises    Limitations  Standing;Walking;House hold activities    Patient Stated Goals  walk better, have no pain    Currently in Pain?  Yes    Pain Score  1     Pain Location  Knee    Pain Orientation  Left  Pain Descriptors / Indicators  Aching    Pain Type  Acute pain;Surgical pain    Pain Onset  1 to 4 weeks ago    Pain Frequency  Constant    Aggravating Factors   walking, bending pain up to 7/10    Pain Relieving Factors  rest and elevation pain can be 1/10    Effect of Pain on Daily Activities  limits all walking         [facility] PT Assessment - 06/25/17 0001      Assessment   Medical Diagnosis  s/p left knee scope    Referring Provider  Dean    Onset Date/Surgical Date  06/07/17    Prior Therapy  not on this knee      Precautions   Precautions  None      Restrictions   Weight Bearing Restrictions  No      Balance Screen   Has the patient fallen in the past 6 months  No    Has the patient had a decrease in activity level because of a fear of falling?   No    Is the patient reluctant to leave their home because  of a fear of falling?   No      Home Environment   Additional Comments  has some stairs at home      Prior Function   Level of Independence  Independent with community mobility with device    Vocation  On disability    Leisure  no exercise      Observation/Other Assessments-Edema    Edema  Circumferential      Circumferential Edema   Circumferential - Left   puffy with some ballotable patella      ROM / Strength   AROM / PROM / Strength  AROM;PROM;Strength      AROM   AROM Assessment Site  Knee    Right/Left Knee  Left    Left Knee Extension  0    Left Knee Flexion  104      PROM   PROM Assessment Site  Knee    Right/Left Knee  Left    Left Knee Extension  0    Left Knee Flexion  110      Strength   Overall Strength Comments  4-/5 with c/o pain      Palpation   Palpation comment  ballotable patella, mild warmth, she is tender around the patella area especially the scars      Ambulation/Gait   Gait Comments  uses a FWW, slow, step to type gait, holds the left knee straight, does not bend                Objective measurements completed on examination: See above findings.              PT Education - 06/25/17 [phone]    Education provided  Yes    Education Details  quad sets, SAQ's, knee flexion    Person(s) Educated  Patient    Methods  Explanation;Demonstration;Handout    Comprehension  Verbalized understanding       PT Short Term Goals - 06/25/17 0950      PT SHORT TERM GOAL #1   Title  I with initial HEP    Time  2    Period  Weeks    Status  New        PT Long Term Goals - 06/25/17 9604      PT  LONG TERM GOAL #1   Title  I with advanced HEP    Time  8    Period  Weeks    Status  New      PT LONG TERM GOAL #2   Title  increase AROM of the left tknee to 0-120 degrees flexion    Time  8    Period  Weeks    Status  New      PT LONG TERM GOAL #3   Title  decreased pain 50% with ADLs    Time  8    Period  Weeks    Status   New      PT LONG TERM GOAL #4   Title  walk with cane for community distances with good form    Time  8    Period  Weeks    Status  New             Plan - 06/25/17 0946    Clinical Impression Statement  Patient had a left knee scope on 06/07/17.  She reports that she has had some left knee pain for a year or more.  Had back surgery last year.  She walks with a walker, stiff legged on the left, step to gait.  She has AROM 0-105 degrees with pain for flexion, PROM 0-110 degrees with pain for flexion.  She has some swelling under the patella    History and Personal Factors relevant to plan of care:  Has history of scar tissue after surgeries, had two manipulations of the right knee after TKR    Clinical Presentation  Stable    Clinical Decision Making  Low    Rehab Potential  Good    PT Frequency  1x / week    PT Duration  12 weeks    PT Treatment/Interventions  ADLs/Self Care Home Management;Cryotherapy;Electrical Stimulation;Gait training;Balance training;Therapeutic exercise;Therapeutic activities;Functional mobility training;Stair training;Patient/family education;Manual techniques;Vasopneumatic Device    PT Next Visit Plan  add exercises and really work on her gait    Consulted and Agree with Plan of Care  Patient       Patient will benefit from skilled therapeutic intervention in order to improve the following deficits and impairments:  Abnormal gait, Decreased range of motion, Difficulty walking, Decreased activity tolerance, Pain, Decreased balance, Increased edema, Decreased strength, Decreased mobility  Visit Diagnosis: Acute pain of left knee - Plan: PT plan of care cert/re-cert  Stiffness of left knee, not elsewhere classified - Plan: PT plan of care cert/re-cert  Difficulty in walking, not elsewhere classified - Plan: PT plan of care cert/re-cert  Localized edema - Plan: PT plan of care cert/re-cert     Problem List Patient Active Problem List   Diagnosis Date  Noted  . Acute sinusitis 03/03/2017  . Hearing difficulty of both ears 03/03/2017  . 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 Herniation of lumbar intervertebral disc with radiculopathy 10/02/2016    Class: Chronic  . Status post laminectomy 10/02/2016  . Alopecia areata 08/19/2016  . Gastroenteritis 03/11/2016  . postop Arthrofibrosis of knee joint, right 11/29/2015  . Pap smear for cervical cancer screening 10/24/2015  . Healthcare maintenance 10/24/2015  . Osteoarthritis of right knee 09/06/2015  . Type 2 diabetes mellitus without complication, without long-term current use of insulin (HCC) 06/13/2015  . Neurogenic claudication due to lumbar spinal stenosis 06/03/2015    Class: Chronic  . Chondromalacia of both patellae 06/03/2015    Class: Chronic  .  Spinal stenosis, lumbar region, with neurogenic claudication 06/03/2015  . Tobacco use disorder 04/25/2015  . DJD (degenerative joint disease) of knee 01/04/2015  . Falls frequently 11/29/2014  . Hemorrhoid 11/14/2014  . Bilateral knee pain 07/31/2014  . Asthma, chronic 07/19/2014  . Gout of big toe 07/19/2014  . Midline low back pain with right-sided sciatica 01/01/2014  . Bilateral low back pain without sciatica 08/14/2013  . Essential hypertension 08/14/2013  . Gastroesophageal reflux disease without esophagitis 08/14/2013  . COPD exacerbation (HCC) 08/14/2013  . Colon cancer screening 08/14/2013  . Low back pain 05/15/2013  . Preventative health care 05/15/2013  . GERD (gastroesophageal reflux disease) 02/16/2013  . DM (diabetes mellitus) (HCC) 02/16/2013  . COPD (chronic obstructive pulmonary disease) (HCC) 04/17/2011  . Hypertension 04/17/2011  . Cough 04/16/2011    Jearld Lesch., PT 06/25/2017, 9:56 AM  [facility] [address] [place], [place], [address] Phone: [phone]   Fax:  [phone]  Name:  Stacy [name] MRN: [id number] Date of Birth: 06-16-1960

## 2017-06-29 ENCOUNTER — Ambulatory Visit: Payer: Medicaid Other | Attending: Internal Medicine | Admitting: Internal Medicine

## 2017-06-29 ENCOUNTER — Encounter: Payer: Self-pay | Admitting: Internal Medicine

## 2017-06-29 VITALS — BP 115/81 | HR 68 | Temp 98.4°F | Resp 16 | Wt 164.2 lb

## 2017-06-29 DIAGNOSIS — M48062 Spinal stenosis, lumbar region with neurogenic claudication: Secondary | ICD-10-CM

## 2017-06-29 DIAGNOSIS — M1712 Unilateral primary osteoarthritis, left knee: Secondary | ICD-10-CM | POA: Diagnosis not present

## 2017-06-29 DIAGNOSIS — Z79899 Other long term (current) drug therapy: Secondary | ICD-10-CM | POA: Diagnosis not present

## 2017-06-29 DIAGNOSIS — J3089 Other allergic rhinitis: Secondary | ICD-10-CM | POA: Diagnosis not present

## 2017-06-29 DIAGNOSIS — M109 Gout, unspecified: Secondary | ICD-10-CM | POA: Insufficient documentation

## 2017-06-29 DIAGNOSIS — I10 Essential (primary) hypertension: Secondary | ICD-10-CM | POA: Diagnosis not present

## 2017-06-29 DIAGNOSIS — Z9889 Other specified postprocedural states: Secondary | ICD-10-CM | POA: Diagnosis not present

## 2017-06-29 DIAGNOSIS — M1710 Unilateral primary osteoarthritis, unspecified knee: Secondary | ICD-10-CM | POA: Diagnosis not present

## 2017-06-29 DIAGNOSIS — Z91048 Other nonmedicinal substance allergy status: Secondary | ICD-10-CM | POA: Diagnosis not present

## 2017-06-29 DIAGNOSIS — Z1159 Encounter for screening for other viral diseases: Secondary | ICD-10-CM

## 2017-06-29 DIAGNOSIS — Z809 Family history of malignant neoplasm, unspecified: Secondary | ICD-10-CM | POA: Insufficient documentation

## 2017-06-29 DIAGNOSIS — E119 Type 2 diabetes mellitus without complications: Secondary | ICD-10-CM | POA: Diagnosis not present

## 2017-06-29 DIAGNOSIS — M25562 Pain in left knee: Secondary | ICD-10-CM | POA: Insufficient documentation

## 2017-06-29 DIAGNOSIS — E1142 Type 2 diabetes mellitus with diabetic polyneuropathy: Secondary | ICD-10-CM

## 2017-06-29 DIAGNOSIS — Z96651 Presence of right artificial knee joint: Secondary | ICD-10-CM | POA: Insufficient documentation

## 2017-06-29 DIAGNOSIS — Z886 Allergy status to analgesic agent status: Secondary | ICD-10-CM | POA: Diagnosis not present

## 2017-06-29 DIAGNOSIS — Z114 Encounter for screening for human immunodeficiency virus [HIV]: Secondary | ICD-10-CM

## 2017-06-29 DIAGNOSIS — Z825 Family history of asthma and other chronic lower respiratory diseases: Secondary | ICD-10-CM | POA: Diagnosis not present

## 2017-06-29 DIAGNOSIS — F17210 Nicotine dependence, cigarettes, uncomplicated: Secondary | ICD-10-CM | POA: Diagnosis not present

## 2017-06-29 DIAGNOSIS — Z7952 Long term (current) use of systemic steroids: Secondary | ICD-10-CM | POA: Diagnosis not present

## 2017-06-29 DIAGNOSIS — Z8249 Family history of ischemic heart disease and other diseases of the circulatory system: Secondary | ICD-10-CM | POA: Insufficient documentation

## 2017-06-29 DIAGNOSIS — K219 Gastro-esophageal reflux disease without esophagitis: Secondary | ICD-10-CM | POA: Insufficient documentation

## 2017-06-29 DIAGNOSIS — J4540 Moderate persistent asthma, uncomplicated: Secondary | ICD-10-CM | POA: Diagnosis not present

## 2017-06-29 DIAGNOSIS — Z803 Family history of malignant neoplasm of breast: Secondary | ICD-10-CM | POA: Insufficient documentation

## 2017-06-29 DIAGNOSIS — Z791 Long term (current) use of non-steroidal anti-inflammatories (NSAID): Secondary | ICD-10-CM | POA: Insufficient documentation

## 2017-06-29 DIAGNOSIS — Z9049 Acquired absence of other specified parts of digestive tract: Secondary | ICD-10-CM | POA: Insufficient documentation

## 2017-06-29 DIAGNOSIS — M5441 Lumbago with sciatica, right side: Secondary | ICD-10-CM | POA: Diagnosis not present

## 2017-06-29 LAB — POCT GLYCOSYLATED HEMOGLOBIN (HGB A1C): HbA1c, POC (controlled diabetic range): 6.6 % (ref 0.0–7.0)

## 2017-06-29 LAB — GLUCOSE, POCT (MANUAL RESULT ENTRY): POC Glucose: 120 mg/dl — AB (ref 70–99)

## 2017-06-29 MED ORDER — LORATADINE 10 MG PO TABS: 10.0000 mg | ORAL_TABLET | Freq: Every day | ORAL | 11 refills | Status: DC

## 2017-06-29 MED ORDER — FLUTICASONE PROPIONATE 50 MCG/ACT NA SUSP: 1.0000 | Freq: Every day | NASAL | 6 refills | Status: DC

## 2017-06-29 MED ORDER — ALBUTEROL SULFATE (2.5 MG/3ML) 0.083% IN NEBU: INHALATION_SOLUTION | RESPIRATORY_TRACT | 6 refills | Status: DC

## 2017-06-29 MED ORDER — GLIMEPIRIDE 2 MG PO TABS: 2.0000 mg | ORAL_TABLET | Freq: Every day | ORAL | 3 refills | Status: DC

## 2017-06-29 NOTE — Patient Instructions (Signed)
Stop Metformin.  Start Amaryl instead. I have sent a prescription to your pharmacy for Claritin and Flonase nasal spray.

## 2017-06-29 NOTE — Progress Notes (Signed)
Patient ID: Stacy [name], female    DOB: [birth year]  MRN: [id number]  CC: Diabetes and Hypertension   Subjective: Stacy [name] is a 57 y.o. female who presents for chronic ds management. Her concerns today include:  Pt with hx of HTN, DM with neuropathy, HL, COPD, RLS, gout, tob dep, anemia, recurrent sinusitis  Had arthroscopic surgery LT knee on 06/07/2016 by Dr. Marlou Sa of [facility].  Currently  Doing PT 2 x a wk. Going good.   Seen by [facility] Sinus:  Getting allergy shots 2 x a wk.  Given an epi pen Endorses sneezing, rhinorrhea, itchy throat and eyes.  Does not have Zyrtec or Flonase  HTN:  She questioned whether she is suppose to be on  HCTZ and Diovan-HCTZ.  Both were on her med list when I first saw her back in February and they are both in her bubble packs. Compliant with meds amd salt restriction.  She has meds sent to her prefilled in bubble packs from [place]  DM:  Checks BS 4 x a day.  She is not on insulin BS range 120-130.  Highest was 190. Eating habits:  "its up and down." Endorses diarrhea several times a week with episodes several times a day when it occurs.  She reports that the diarrhea has decreased some when she was changed from regular Metformin to extended release by her previous PCP.  However the diarrhea is still very bothersome to her  Patient Active Problem List   Diagnosis Date Noted  . Acute sinusitis 03/03/2017  . Hearing difficulty of both ears 03/03/2017  . 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 Lichen planopilaris 40/98/1191  . Herniation of lumbar intervertebral disc with radiculopathy 10/02/2016    Class: Chronic  . Status post laminectomy 10/02/2016  . Alopecia areata 08/19/2016  . Gastroenteritis 03/11/2016  . postop Arthrofibrosis of knee joint, right 11/29/2015  . Pap smear for cervical cancer screening 10/24/2015  . Healthcare maintenance 10/24/2015  . Osteoarthritis of right knee 09/06/2015    . Type 2 diabetes mellitus without complication, without long-term current use of insulin ([place]) 06/13/2015  . Neurogenic claudication due to lumbar spinal stenosis 06/03/2015    Class: Chronic  . Chondromalacia of both patellae 06/03/2015    Class: Chronic  . Spinal stenosis, lumbar region, with neurogenic claudication 06/03/2015  . Tobacco use disorder 04/25/2015  . DJD (degenerative joint disease) of knee 01/04/2015  . Falls frequently 11/29/2014  . Hemorrhoid 11/14/2014  . Bilateral knee pain 07/31/2014  . Asthma, chronic 07/19/2014  . Gout of big toe 07/19/2014  . Midline low back pain with right-sided sciatica 01/01/2014  . Bilateral low back pain without sciatica 08/14/2013  . Essential hypertension 08/14/2013  . Gastroesophageal reflux disease without esophagitis 08/14/2013  . COPD exacerbation ([place]) 08/14/2013  . Colon cancer screening 08/14/2013  . Low back pain 05/15/2013  . Preventative health care 05/15/2013  . GERD (gastroesophageal reflux disease) 02/16/2013  . DM (diabetes mellitus) ([place]) 02/16/2013  . COPD (chronic obstructive pulmonary disease) ([place]) 04/17/2011  . Hypertension 04/17/2011  . Cough 04/16/2011     Current Outpatient Medications on File Prior to Visit  Medication Sig Dispense Refill  . acetaminophen-codeine (TYLENOL #3) 300-30 MG tablet Take 1 tablet by mouth every 4 (four) hours as needed. 30 tablet 0  . allopurinol (ZYLOPRIM) 100 MG tablet Take 1 tablet (100 mg total) by mouth daily. 90 tablet 3  . Blood Glucose Monitoring Suppl (ACCU-CHEK AVIVA  PLUS) w/Device KIT 1 each by Does not apply route 3 (three) times daily. 1 kit 0  . budesonide-formoterol (SYMBICORT) 80-4.5 MCG/ACT inhaler INHALE TWO PUFFS BY MOUTH TWICE A DAY 30.6 g 0  . celecoxib (CELEBREX) 200 MG capsule Take 1 capsule (200 mg total) by mouth 2 (two) times daily. 180 capsule 3  . cholecalciferol (VITAMIN D) 1000 units tablet Take 1,000 Units by mouth daily.    . diclofenac sodium  (VOLTAREN) 1 % GEL Apply 4 g topically 4 (four) times daily. 3 Tube 1  . EPINEPHrine 0.3 mg/0.3 mL IJ SOAJ injection 0.3 mg IM x 1 PRN for allergic reaction 1 Device 1  . fluocinonide (LIDEX) 0.05 % external solution Apply 1 application topically 2 (two) times daily. 60 mL 0  . gabapentin (NEURONTIN) 300 MG capsule Take 1 capsule (300 mg total) by mouth 3 (three) times daily. 270 capsule 3  . glucose blood (TRUE METRIX BLOOD GLUCOSE TEST) test strip USE AS DIRECTED BY PHYSICIAN 100 each 12  . hydrochlorothiazide (HYDRODIURIL) 25 MG tablet Take 25 mg by mouth daily.  3  . hydrOXYzine (ATARAX/VISTARIL) 10 MG tablet 1 tab PO Q a.m and noon and 2 tabs PO Q p.m PRN 90 tablet 3  . Lancets (ACCU-CHEK SOFT TOUCH) lancets Use as instructed 100 each 12  . methocarbamol (ROBAXIN) 500 MG tablet Take 1 tablet (500 mg total) by mouth every 6 (six) hours as needed for muscle spasms. 60 tablet 3  . MITIGARE 0.6 MG CAPS Take 1 capsule by mouth daily. 30 capsule 3  . Multiple Vitamin (MULTIVITAMIN WITH MINERALS) TABS tablet Take 1 tablet by mouth daily.    Marland Kitchen NICOTINE STEP 2 14 MG/24HR patch PLACE 1 PATCH ONTO THE SKIN DAILY 30 patch 3  . pantoprazole (PROTONIX) 40 MG tablet Take 1 tablet (40 mg total) by mouth daily. 90 tablet 3  . simvastatin (ZOCOR) 20 MG tablet Take 1 tablet (20 mg total) by mouth at bedtime. 90 tablet 3  . sucralfate (CARAFATE) 1 g tablet Take 1 g by mouth 4 (four) times daily -  with meals and at bedtime.    . valsartan-hydrochlorothiazide (DIOVAN-HCT) 160-12.5 MG tablet Take 1 tablet by mouth daily. 90 tablet 3  . [DISCONTINUED] Fluticasone-Salmeterol (ADVAIR) 500-50 MCG/DOSE AEPB Inhale 1 puff into the lungs every 12 (twelve) hours.      . [DISCONTINUED] lisinopril (PRINIVIL,ZESTRIL) 40 MG tablet Take 40 mg by mouth daily.       No current facility-administered medications on file prior to visit.     Allergies  Allergen Reactions  . Other Shortness Of Breath    UNSPECIFIED  AGENTS Allergic to perfumes and cleaning products  . Ace Inhibitors Cough       . Aspirin Nausea Only    Social History   Socioeconomic History  . Marital status: Married    Spouse name: Not on file  . Number of children: 2  . Years of education: Not on file  . Highest education level: Not on file  Occupational History  . Occupation: unemployed    Fish farm manager: UNEMPLOYED  Social Needs  . Financial resource strain: Not on file  . Food insecurity:    Worry: Not on file    Inability: Not on file  . Transportation needs:    Medical: Not on file    Non-medical: Not on file  Tobacco Use  . Smoking status: Current Some Day Smoker    Packs/day: 0.25    Years: 32.00  Pack years: 8.00    Types: Cigarettes  . Smokeless tobacco: Never Used  Substance and Sexual Activity  . Alcohol use: No  . Drug use: No  . Sexual activity: Yes    Birth control/protection: Surgical  Lifestyle  . Physical activity:    Days per week: Not on file    Minutes per session: Not on file  . Stress: Not on file  Relationships  . Social connections:    Talks on phone: Not on file    Gets together: Not on file    Attends religious service: Not on file    Active member of club or organization: Not on file    Attends meetings of clubs or organizations: Not on file    Relationship status: Not on file  . Intimate partner violence:    Fear of current or ex partner: Not on file    Emotionally abused: Not on file    Physically abused: Not on file    Forced sexual activity: Not on file  Other Topics Concern  . Not on file  Social History Narrative  . Not on file    Family History  Problem Relation Age of Onset  . Hypertension Father   . Cancer Father   . Heart disease Mother   . Asthma Son        had as a child  . Heart disease Sister   . Breast cancer Sister   . Hypertension Brother     Past Surgical History:  Procedure Laterality Date  . CHOLECYSTECTOMY    . COLONOSCOPY    .  ENDOMETRIAL ABLATION  10/2010  . HERNIA REPAIR     umbicial hernia  . KNEE ARTHROSCOPY Left    06/07/2017 Dr. Marlou Sa of [facility]  . KNEE CLOSED REDUCTION Right 12/06/2015   Procedure: CLOSED MANIPULATION RIGHT KNEE;  Surgeon: [name], MD;  Location: [place];  Service: Orthopedics;  Laterality: Right;  . KNEE CLOSED REDUCTION Right 01/17/2016   Procedure: CLOSED MANIPULATION RIGHT KNEE;  Surgeon: [name], MD;  Location: [facility];  Service: Orthopedics;  Laterality: Right;  . KNEE JOINT MANIPULATION Right 12/06/2015  . LUMBAR DISC SURGERY  06/03/2015   L 2  L3 L4 L5   . LUMBAR LAMINECTOMY/DECOMPRESSION MICRODISCECTOMY N/A 06/03/2015   Procedure: Bilateral lateral recess decompression L2-3, L3-4, L4-5;  Surgeon: [name], MD;  Location: [facility];  Service: Orthopedics;  Laterality: N/A;  . LUMBAR LAMINECTOMY/DECOMPRESSION MICRODISCECTOMY N/A 10/02/2016   Procedure: Right L5-S1 Lateral Recess Decompression  microdiscectomy;  Surgeon: [name], MD;  Location: [facility];  Service: Orthopedics;  Laterality: N/A;  . svd      x 2  . TOTAL KNEE ARTHROPLASTY Right 09/06/2015   Procedure: RIGHT TOTAL KNEE ARTHROPLASTY;  Surgeon: [name], MD;  Location: [facility];  Service: Orthopedics;  Laterality: Right;  . TUBAL LIGATION    . UPPER GASTROINTESTINAL ENDOSCOPY  04/28/11    ROS: Review of Systems Negative except as stated above PHYSICAL EXAM: BP 115/81   Pulse 68   Temp 98.4 F (36.9 C) (Oral)   Resp 16   Wt 164 lb 3.2 oz (74.5 kg)   SpO2 100%   BMI 26.50 kg/m   Wt Readings from Last 3 Encounters:  06/29/17 164 lb 3.2 oz (74.5 kg)  05/10/17 166 lb (75.3 kg)  04/07/17 166 lb (75.3 kg)    Physical Exam General appearance - alert, well appearing, and in no distress Mental  status - alert, oriented to person, place, and time, normal mood, behavior, speech, dress, motor activity, and thought processes Nose:  Mild enlargement of both nasal turbinates Mouth - mucous membranes moist,  pharynx normal without lesions Neck - supple, no significant adenopathy Chest - clear to auscultation, no wheezes, rales or rhonchi, symmetric air entry Heart - normal rate, regular rhythm, normal S1, S2, no murmurs, rubs, clicks or gallops Extremities - peripheral pulses normal, no pedal edema, no clubbing or cyanosis MSK: She is ambulating with a cane.  She is not able to bend the left knee.  Left knee is without erythema and effusion. Results for orders placed or performed in visit on 06/29/17  POCT glucose (manual entry)  Result Value Ref Range   POC Glucose 120 (A) 70 - 99 mg/dl  POCT glycosylated hemoglobin (Hb A1C)  Result Value Ref Range   Hemoglobin A1C  4.0 - 5.6 %   HbA1c, POC (prediabetic range)  5.7 - 6.4 %   HbA1c, POC (controlled diabetic range) 6.6 0.0 - 7.0 %     ASSESSMENT AND PLAN: 1. Type 2 diabetes mellitus with diabetic polyneuropathy, without long-term current use of insulin ([place]) At goal. Advised that we change metformin to Amaryl due to intolerable GI side effects from the former.  Patient agreeable with this.  Check blood sugars once a day.  Does not need to be checking multiple times since blood sugars are stable and she is not on insulin - POCT glucose (manual entry) - POCT glycosylated hemoglobin (Hb A1C) - glimepiride (AMARYL) 2 MG tablet; Take 1 tablet (2 mg total) by mouth daily before breakfast.  Dispense: 30 tablet; Refill: 3  2. Moderate persistent asthma without complication - albuterol (PROVENTIL) (2.5 MG/3ML) 0.083% nebulizer solution; USE 1 VIAL VIA NEBULIZER EVERY 6 HOURS AS NEEDED FOR WHEEZING  Dispense: 180 mL; Refill: 6  3. Primary osteoarthritis of left knee Recent arthroscopic surgery.  Currently receiving physical therapy.  4. Environmental and seasonal allergies - fluticasone (FLONASE) 50 MCG/ACT nasal spray; Place 1 spray into both nostrils daily.  Dispense: 16 g; Refill: 6 - loratadine (CLARITIN) 10 MG tablet; Take 1 tablet (10 mg  total) by mouth daily.  Dispense: 30 tablet; Refill: 11  5. Need for hepatitis C screening test - Hepatitis c antibody (reflex)  6. Encounter for screening for HIV - HIV antibody (with reflex)  7. Essential hypertension Continue Diovan/HCTZ and HCTZ.  Check BMP today - Basic Metabolic Panel   Patient was given the opportunity to ask questions.  Patient verbalized understanding of the plan and was able to repeat key elements of the plan.   Orders Placed This Encounter  Procedures  . Hepatitis c antibody (reflex)  . HIV antibody (with reflex)  . Basic Metabolic Panel  . POCT glucose (manual entry)  . POCT glycosylated hemoglobin (Hb A1C)     Requested Prescriptions   Signed Prescriptions Disp Refills  . fluticasone (FLONASE) 50 MCG/ACT nasal spray 16 g 6    Sig: Place 1 spray into both nostrils daily.  Marland Kitchen glimepiride (AMARYL) 2 MG tablet 30 tablet 3    Sig: Take 1 tablet (2 mg total) by mouth daily before breakfast.  . albuterol (PROVENTIL) (2.5 MG/3ML) 0.083% nebulizer solution 180 mL 6    Sig: USE 1 VIAL VIA NEBULIZER EVERY 6 HOURS AS NEEDED FOR WHEEZING  . loratadine (CLARITIN) 10 MG tablet 30 tablet 11    Sig: Take 1 tablet (10 mg total) by mouth daily.  Return in about 3 months (around 09/29/2017).  Karle Plumber, MD, FACP

## 2017-06-29 NOTE — Progress Notes (Signed)
Com JRP

## 2017-06-30 LAB — BASIC METABOLIC PANEL
BUN/Creatinine Ratio: 8 — ABNORMAL LOW (ref 9–23)
BUN: 7 mg/dL (ref 6–24)
CO2: 25 mmol/L (ref 20–29)
Calcium: 10.1 mg/dL (ref 8.7–10.2)
Chloride: 95 mmol/L — ABNORMAL LOW (ref 96–106)
Creatinine, Ser: 0.86 mg/dL (ref 0.57–1.00)
GFR calc Af Amer: 87 mL/min/{1.73_m2} (ref >59)
GFR calc non Af Amer: 75 mL/min/{1.73_m2} (ref >59)
Glucose: 93 mg/dL (ref 65–99)
Potassium: 3.6 mmol/L (ref 3.5–5.2)
Sodium: 138 mmol/L (ref 134–144)

## 2017-06-30 LAB — HCV COMMENT:

## 2017-06-30 LAB — HEPATITIS C ANTIBODY (REFLEX): HCV Ab: <0.1 s/co ratio (ref 0.0–0.9)

## 2017-06-30 LAB — HIV ANTIBODY (ROUTINE TESTING W REFLEX): HIV Screen 4th Generation wRfx: NONREACTIVE

## 2017-07-02 ENCOUNTER — Other Ambulatory Visit: Payer: Self-pay | Admitting: Internal Medicine

## 2017-07-02 DIAGNOSIS — J441 Chronic obstructive pulmonary disease with (acute) exacerbation: Secondary | ICD-10-CM

## 2017-07-02 DIAGNOSIS — F17200 Nicotine dependence, unspecified, uncomplicated: Secondary | ICD-10-CM

## 2017-07-05 ENCOUNTER — Encounter: Payer: Self-pay | Admitting: Physical Therapy

## 2017-07-05 ENCOUNTER — Ambulatory Visit: Payer: Medicaid Other | Attending: Orthopedic Surgery | Admitting: Physical Therapy

## 2017-07-05 DIAGNOSIS — M25561 Pain in right knee: Secondary | ICD-10-CM | POA: Insufficient documentation

## 2017-07-05 DIAGNOSIS — R600 Localized edema: Secondary | ICD-10-CM | POA: Insufficient documentation

## 2017-07-05 DIAGNOSIS — R262 Difficulty in walking, not elsewhere classified: Secondary | ICD-10-CM | POA: Diagnosis present

## 2017-07-05 DIAGNOSIS — M25661 Stiffness of right knee, not elsewhere classified: Secondary | ICD-10-CM | POA: Insufficient documentation

## 2017-07-05 DIAGNOSIS — M25662 Stiffness of left knee, not elsewhere classified: Secondary | ICD-10-CM | POA: Diagnosis present

## 2017-07-05 DIAGNOSIS — M5441 Lumbago with sciatica, right side: Secondary | ICD-10-CM | POA: Diagnosis present

## 2017-07-05 DIAGNOSIS — M25562 Pain in left knee: Secondary | ICD-10-CM | POA: Insufficient documentation

## 2017-07-05 NOTE — Therapy (Signed)
[facility] [place] [facility] [address] [place], [place], [address] Phone: [phone]   Fax:  [phone]  Physical Therapy Treatment  Patient Details  Name: Stacy [name] MRN: [id number] Date of Birth: Jun 12, 1960 Referring Provider: August Saucer   Encounter Date: 07/05/2017  PT End of Session - 07/05/17 1428    Visit Number  2    Number of Visits  4    Authorization Type  Medicaid    PT Start Time  1330    PT Stop Time  1436    PT Time Calculation (min)  66 min    Activity Tolerance  Patient tolerated treatment well    Behavior During Therapy  [facility] for tasks assessed/performed       Past Medical History:  Diagnosis Date  . Arthritis   . 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 Asthma   . COPD (chronic obstructive pulmonary disease) (H[facility])   . Diabetes mellitus    Type II  . GERD (gastroesophageal reflux disease)    Pt on Protonix daily  . Glaucoma   . Gout   . Headache(784.0)    otc meds prn  . Hyperlipidemia   . Hypertension    Pt on lisinopril  . Irritable bowel syndrome 11/19/2010  . Neuropathy   . Pneumonia   . Restless legs   . Shortness of breath    occasional - uses breathing tx at home    Past Surgical History:  Procedure Laterality Date  . CHOLECYSTECTOMY    . COLONOSCOPY    . ENDOMETRIAL ABLATION  10/2010  . HERNIA REPAIR     umbicial hernia  . KNEE ARTHROSCOPY Left    06/07/2017 Dr. August Saucer of [facility]  . KNEE CLOSED REDUCTION Right 12/06/2015   Procedure: CLOSED MANIPULATION RIGHT KNEE;  Surgeon: Kerrin Champagne, MD;  [facility];  Service: Orthopedics;  Laterality: Right;  . KNEE CLOSED REDUCTION Right 01/17/2016   Procedure: CLOSED MANIPULATION RIGHT KNEE;  Surgeon: Kerrin Champagne, MD;  Location: [facility];  Service: Orthopedics;  Laterality: Right;  . KNEE JOINT MANIPULATION Right 12/06/2015  . LUMBAR DISC SURGERY  06/03/2015   L 2  L3 L4 L5   . LUMBAR LAMINECTOMY/DECOMPRESSION  MICRODISCECTOMY N/A 06/03/2015   Procedure: Bilateral lateral recess decompression L2-3, L3-4, L4-5;  Surgeon: Kerrin Champagne, MD;  Location: [facility];  Service: Orthopedics;  Laterality: N/A;  . LUMBAR LAMINECTOMY/DECOMPRESSION MICRODISCECTOMY N/A 10/02/2016   Procedure: Right L5-S1 Lateral Recess Decompression  microdiscectomy;  Surgeon: Kerrin Champagne, MD;  Location: [facility] OR;  Service: Orthopedics;  Laterality: N/A;  . svd      x 2  . TOTAL KNEE ARTHROPLASTY Right 09/06/2015   Procedure: RIGHT TOTAL KNEE ARTHROPLASTY;  Surgeon: Kerrin Champagne, MD;  [facility];  Service: Orthopedics;  Laterality: Right;  . TUBAL LIGATION    . UPPER GASTROINTESTINAL ENDOSCOPY  04/28/11    There were no vitals filed for this visit.  Subjective Assessment - 07/05/17 1424    Subjective  Patient reports just sore and stiff    Currently in Pain?  Yes    Pain Score  2     Pain Location  Knee    Pain Orientation  Left    Aggravating Factors   bending                       [facility] Adult PT Treatment/Exercise - 07/05/17  0001      Ambulation/Gait   Gait Comments  gait down stairs handrail and hand hold assist then outside and up slope and uneven surfaces      Exercises   Exercises  Knee/Hip      Knee/Hip Exercises: Stretches   Gastroc Stretch  3 reps;20 seconds      Knee/Hip Exercises: Aerobic   Recumbent Bike  full revs x 5 minutes    Nustep  level 4 x 7 minutes    Other Aerobic  resisted gait all directions      Knee/Hip Exercises: Machines for Strengthening   Cybex Knee Extension  5# 3x10    Cybex Knee Flexion  20# 3x10    Cybex Leg Press  20# 3x10      Knee/Hip Exercises: Supine   Other Supine Knee/Hip Exercises  feet on ball K2C, bridges      Modalities   Modalities  Vasopneumatic      Vasopneumatic   Number Minutes Vasopneumatic   15 minutes    Vasopnuematic Location   Knee    Vasopneumatic Pressure  Medium    Vasopneumatic Temperature   32               PT Short  Term Goals - 07/05/17 1429      PT SHORT TERM GOAL #1   Title  I with initial HEP    Status  Achieved        PT Long Term Goals - 06/25/17 0950      PT LONG TERM GOAL #1   Title  I with advanced HEP    Time  8    Period  Weeks    Status  New      PT LONG TERM GOAL #2   Title  increase AROM of the left tknee to 0-120 degrees flexion    Time  8    Period  Weeks    Status  New      PT LONG TERM GOAL #3   Title  decreased pain 50% with ADLs    Time  8    Period  Weeks    Status  New      PT LONG TERM GOAL #4   Title  walk with cane for community distances with good form    Time  8    Period  Weeks    Status  New            Plan - 07/05/17 1428    Clinical Impression Statement  Patient did very well with the exercises today, she was able to go around on the bicycle, she was able to go down steps step over step.  Had one time where she c/o a catch    PT Next Visit Plan  add exercises and really work on her gait    Consulted and Agree with Plan of Care  Patient       Patient will benefit from skilled therapeutic intervention in order to improve the following deficits and impairments:  Abnormal gait, Decreased range of motion, Difficulty walking, Decreased activity tolerance, Pain, Decreased balance, Increased edema, Decreased strength, Decreased mobility  Visit Diagnosis: Acute pain of left knee  Stiffness of left knee, not elsewhere classified  Difficulty in walking, not elsewhere classified  Localized edema     Problem List Patient Active Problem List   Diagnosis Date Noted  . Moderate persistent asthma without complication 06/29/2017  . Environmental and seasonal allergies 06/29/2017  .  Controlled type 2 diabetes mellitus with diabetic polyneuropathy, without long-term current use of insulin (HCC) 06/29/2017  . Hearing difficulty of both ears 03/03/2017  . 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 Herniation of  lumbar intervertebral disc with radiculopathy 10/02/2016    Class: Chronic  . Alopecia areata 08/19/2016  . Chondromalacia of both patellae 06/03/2015    Class: Chronic  . Spinal stenosis, lumbar region, with neurogenic claudication 06/03/2015  . Tobacco use disorder 04/25/2015  . DJD (degenerative joint disease) of knee 01/04/2015  . Hemorrhoid 11/14/2014  . Gout of big toe 07/19/2014  . Essential hypertension 08/14/2013  . Gastroesophageal reflux disease without esophagitis 08/14/2013  . COPD (chronic obstructive pulmonary disease) (HCC) 04/17/2011    Jearld Lesch., PT 07/05/2017, 2:29 PM  [facility] [address] [place] [address] [place], [place], [address] Phone: [phone]   Fax:  [phone]  Name: Stacy [name] MRN: [id number] Date of Birth: 1960/03/22

## 2017-07-12 ENCOUNTER — Ambulatory Visit: Payer: Medicaid Other | Admitting: Podiatry

## 2017-07-12 ENCOUNTER — Encounter: Payer: Self-pay | Admitting: Podiatry

## 2017-07-12 DIAGNOSIS — M79676 Pain in unspecified toe(s): Secondary | ICD-10-CM

## 2017-07-12 DIAGNOSIS — B351 Tinea unguium: Secondary | ICD-10-CM

## 2017-07-12 DIAGNOSIS — E1142 Type 2 diabetes mellitus with diabetic polyneuropathy: Secondary | ICD-10-CM

## 2017-07-12 NOTE — Progress Notes (Signed)
Subjective:   Patient ID: Stacy [name], female   DOB: 57 y.o.   MRN: [id number]   HPI Patient presents with diabetes with thick nail disease 1-5 both feet that are incurvated and can become tender.  She cannot cut them herself due to her long-term problems   ROS      Objective:  Physical Exam  No change neurovascular status with thick yellow brittle nailbeds 1-5 both feet that are moderately painful     Assessment:  Mycotic nail infection 1-5 both feet with pain with diabetes as risk factors     Plan:  Debride painful nailbeds 1-5 both feet with no iatrogenic bleeding noted

## 2017-07-14 ENCOUNTER — Ambulatory Visit: Payer: Medicaid Other | Admitting: Physical Therapy

## 2017-07-14 ENCOUNTER — Encounter (INDEPENDENT_AMBULATORY_CARE_PROVIDER_SITE_OTHER): Payer: Self-pay | Admitting: Orthopedic Surgery

## 2017-07-14 ENCOUNTER — Encounter: Payer: Self-pay | Admitting: Physical Therapy

## 2017-07-14 ENCOUNTER — Ambulatory Visit (INDEPENDENT_AMBULATORY_CARE_PROVIDER_SITE_OTHER): Payer: Medicaid Other | Admitting: Orthopedic Surgery

## 2017-07-14 DIAGNOSIS — R262 Difficulty in walking, not elsewhere classified: Secondary | ICD-10-CM

## 2017-07-14 DIAGNOSIS — M25561 Pain in right knee: Secondary | ICD-10-CM

## 2017-07-14 DIAGNOSIS — M5441 Lumbago with sciatica, right side: Secondary | ICD-10-CM

## 2017-07-14 DIAGNOSIS — R600 Localized edema: Secondary | ICD-10-CM

## 2017-07-14 DIAGNOSIS — M25562 Pain in left knee: Secondary | ICD-10-CM | POA: Diagnosis not present

## 2017-07-14 DIAGNOSIS — M222X2 Patellofemoral disorders, left knee: Secondary | ICD-10-CM

## 2017-07-14 DIAGNOSIS — M25661 Stiffness of right knee, not elsewhere classified: Secondary | ICD-10-CM

## 2017-07-14 DIAGNOSIS — M25662 Stiffness of left knee, not elsewhere classified: Secondary | ICD-10-CM

## 2017-07-14 NOTE — Progress Notes (Signed)
Post-Op Visit Note   Patient: Stacy [name]           Date of Birth: [birth year]           MRN: [id number] Visit Date: 07/14/2017 PCP: Marcine MatarJohnson, Deborah B, MD   Assessment & Plan:  Chief Complaint:  Chief Complaint  Patient presents with  . Left Knee - Routine Post Op   Visit Diagnoses: No diagnosis found.  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  She is in therapy doing range of motion and strengthening exercises 1-2 times a week.  Not take any medicines for pain.  On examination she has no effusion good range of motion in that left knee with improving quad strength.  Plan is to continue getting both quad stronger which will help with some aches and pain she is having in the right knee as well.  I will see her back as needed.  Overall the left knee is doing very well.  Follow-Up Instructions: Return if symptoms worsen or fail to improve.   Orders:  No orders of the defined types were placed in this encounter.  No orders of the defined types were placed in this encounter.   Imaging: No results found.  PMFS History: Patient Active Problem List   Diagnosis Date Noted  . Moderate persistent asthma without complication 06/29/2017  . Environmental and seasonal allergies 06/29/2017  . Controlled type 2 diabetes mellitus with diabetic polyneuropathy, without long-term current use of insulin (HCC) 06/29/2017  . Hearing difficulty of both ears 03/03/2017  . 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 Herniation of lumbar intervertebral disc with radiculopathy 10/02/2016    Class: Chronic  . Alopecia areata 08/19/2016  . Chondromalacia of both patellae 06/03/2015    Class: Chronic  . Spinal stenosis, lumbar region, with neurogenic claudication 06/03/2015  . Tobacco use disorder 04/25/2015  . DJD (degenerative joint disease) of knee 01/04/2015    . Hemorrhoid 11/14/2014  . Gout of big toe 07/19/2014  . Essential hypertension 08/14/2013  . Gastroesophageal reflux disease without esophagitis 08/14/2013  . COPD (chronic obstructive pulmonary disease) (HCC) 04/17/2011   Past Medical History:  Diagnosis Date  . Arthritis   . 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 Asthma   . COPD (chronic obstructive pulmonary disease) (HCC)   . Diabetes mellitus    Type II  . GERD (gastroesophageal reflux disease)    Pt on Protonix daily  . Glaucoma   . Gout   . Headache(784.0)    otc meds prn  . Hyperlipidemia   . Hypertension    Pt on lisinopril  . Irritable bowel syndrome 11/19/2010  . Neuropathy   . Pneumonia   . Restless legs   . Shortness of breath    occasional - uses breathing tx at home    Family History  Problem Relation Age of Onset  . Hypertension Father   . Cancer Father   . Heart disease Mother   . Asthma Son        had as a child  . Heart disease Sister   . Breast cancer Sister   . Hypertension Brother     Past Surgical History:  Procedure Laterality Date  . CHOLECYSTECTOMY    . COLONOSCOPY    . ENDOMETRIAL ABLATION  10/2010  . HERNIA REPAIR     umbicial  hernia  . KNEE ARTHROSCOPY Left    06/07/2017 Dr. August Saucer of [facility]  . KNEE CLOSED REDUCTION Right 12/06/2015   Procedure: CLOSED MANIPULATION RIGHT KNEE;  Surgeon: Kerrin Champagne, MD;  [facility];  Service: Orthopedics;  Laterality: Right;  . KNEE CLOSED REDUCTION Right 01/17/2016   Procedure: CLOSED MANIPULATION RIGHT KNEE;  Surgeon: Kerrin Champagne, MD;  Location[facility];  Service: Orthopedics;  Laterality: Right;  . KNEE JOINT MANIPULATION Right 12/06/2015  . LUMBAR DISC SURGERY  06/03/2015   L 2  L3 L4 L5   . LUMBAR LAMINECTOMY/DECOMPRESSION MICRODISCECTOMY N/A 06/03/2015   Procedure: Bilateral lateral recess decompression L2-3, L3-4, L4-5;  Surgeon: Kerrin Champagne, MD;  Location: [facility];  Service: Orthopedics;  Laterality: N/A;  .  LUMBAR LAMINECTOMY/DECOMPRESSION MICRODISCECTOMY N/A 10/02/2016   Procedure: Right L5-S1 Lateral Recess Decompression  microdiscectomy;  Surgeon: Kerrin Champagne, MD;  Location: [facility] OR;  Service: Orthopedics;  Laterality: N/A;  . svd      x 2  . TOTAL KNEE ARTHROPLASTY Right 09/06/2015   Procedure: RIGHT TOTAL KNEE ARTHROPLASTY;  Surgeon: Kerrin Champagne, MD;  [facility]: [facility];  Service: Orthopedics;  Laterality: Right;  . TUBAL LIGATION    . UPPER GASTROINTESTINAL ENDOSCOPY  04/28/11   Social History   Occupational History  . Occupation: unemployed    Associate Professor: UNEMPLOYED  Tobacco Use  . Smoking status: Current Some Day Smoker    Packs/day: 0.25    Years: 32.00    Pack years: 8.00    Types: Cigarettes  . Smokeless tobacco: Never Used  Substance and Sexual Activity  . Alcohol use: No  . Drug use: No  . Sexual activity: Yes    Birth control/protection: Surgical

## 2017-07-14 NOTE — Therapy (Signed)
[address] [place], [place], [address] Phone: [phone]   Fax:  [phone]  Physical Therapy Treatment  Patient Details  Name: Stacy [name] MRN: [id number] Date of Birth: 05/19/1960 Referring Provider: Marlou Sa   Encounter Date: 07/14/2017  PT End of Session - 07/14/17 1520    Visit Number  3    Number of Visits  4    Authorization Type  Medicaid    PT Start Time  1430    PT Stop Time  1533    PT Time Calculation (min)  63 min    Activity Tolerance  Patient tolerated treatment well    Behavior During Therapy  [facility] for tasks assessed/performed       Past Medical History:  Diagnosis Date  . Arthritis   . Arthrofibrosis of total knee replacement ([place])    right  . Asthma   . COPD (chronic obstructive pulmonary disease) ([place])   . Diabetes mellitus    Type II  . GERD (gastroesophageal reflux disease)    Pt on Protonix daily  . Glaucoma   . Gout   . Headache(784.0)    otc meds prn  . Hyperlipidemia   . Hypertension    Pt on lisinopril  . Irritable bowel syndrome 11/19/2010  . Neuropathy   . Pneumonia   . Restless legs   . Shortness of breath    occasional - uses breathing tx at home    Past Surgical History:  Procedure Laterality Date  . CHOLECYSTECTOMY    . COLONOSCOPY    . ENDOMETRIAL ABLATION  10/2010  . HERNIA REPAIR     umbicial hernia  . KNEE ARTHROSCOPY Left    06/07/2017 Dr. Marlou Sa of [facility]  . KNEE CLOSED REDUCTION Right 12/06/2015   Procedure: CLOSED MANIPULATION RIGHT KNEE;  Surgeon: [name], MD;  Location: [place];  Service: Orthopedics;  Laterality: Right;  . KNEE CLOSED REDUCTION Right 01/17/2016   Procedure: CLOSED MANIPULATION RIGHT KNEE;  Surgeon: [name], MD;  Location: [place];  Service: Orthopedics;  Laterality: Right;  . KNEE JOINT MANIPULATION Right 12/06/2015  . LUMBAR DISC SURGERY  06/03/2015   L 2  L3 L4 L5   . LUMBAR LAMINECTOMY/DECOMPRESSION  MICRODISCECTOMY N/A 06/03/2015   Procedure: Bilateral lateral recess decompression L2-3, L3-4, L4-5;  Surgeon: [name], MD;  Location: [facility] [place];  Service: Orthopedics;  Laterality: N/A;  . LUMBAR LAMINECTOMY/DECOMPRESSION MICRODISCECTOMY N/A 10/02/2016   Procedure: Right L5-S1 Lateral Recess Decompression  microdiscectomy;  Surgeon: [name], MD;  Location: [facility];  Service: Orthopedics;  Laterality: N/A;  . svd      x 2  . TOTAL KNEE ARTHROPLASTY Right 09/06/2015   Procedure: RIGHT TOTAL KNEE ARTHROPLASTY;  Surgeon: [name], MD;  Location: [place];  Service: Orthopedics;  Laterality: Right;  . TUBAL LIGATION    . UPPER GASTROINTESTINAL ENDOSCOPY  04/28/11    There were no vitals filed for this visit.  Subjective Assessment - 07/14/17 1518    Subjective  Patient saw the orthopod today, reports "they said continue PT"  she does c/o some increased pain and swelling posterior right knee    Currently in Pain?  Yes    Pain Score  5     Pain Location  Knee    Pain Orientation  Right;Posterior    Pain Descriptors / Indicators  Aching    Aggravating Factors   straightening  [facility] Adult PT Treatment/Exercise - 07/14/17 0001      Knee/Hip Exercises: Aerobic   Recumbent Bike  full revs x 5 minutes    Nustep  level 4 x 7 minutes    Other Aerobic  resisted gait all directions      Knee/Hip Exercises: Machines for Strengthening   Cybex Knee Extension  5# 3x10    Cybex Knee Flexion  20# 3x10    Cybex Leg Press  20# 3x10      Knee/Hip Exercises: Supine   Other Supine Knee/Hip Exercises  feet on ball K2C, bridges      Modalities   Modalities  Vasopneumatic;Electrical Stimulation      Electrical Stimulation   Electrical Stimulation Location  posterior right knee    Electrical Stimulation Action  IFC    Electrical Stimulation Parameters  in elevation    Electrical Stimulation Goals  Pain      Vasopneumatic   Number Minutes Vasopneumatic   15  minutes    Vasopnuematic Location   Knee    Vasopneumatic Pressure  Medium    Vasopneumatic Temperature   32               PT Short Term Goals - 07/05/17 1429      PT SHORT TERM GOAL #1   Title  I with initial HEP    Status  Achieved        PT Long Term Goals - 07/14/17 1521      PT LONG TERM GOAL #2   Title  increase AROM of the left tknee to 0-120 degrees flexion    Status  Partially Met      PT LONG TERM GOAL #3   Title  decreased pain 50% with ADLs    Status  Partially Met            Plan - 07/14/17 1520    Clinical Impression Statement  Patient with some incresaed right posterior knee pain, seems tpo have some swelling here as well.  She reports pain iwth extension.      PT Next Visit Plan  see if estim helped pain    Consulted and Agree with Plan of Care  Patient       Patient will benefit from skilled therapeutic intervention in order to improve the following deficits and impairments:  Abnormal gait, Decreased range of motion, Difficulty walking, Decreased activity tolerance, Pain, Decreased balance, Increased edema, Decreased strength, Decreased mobility  Visit Diagnosis: Acute pain of left knee  Stiffness of left knee, not elsewhere classified  Difficulty in walking, not elsewhere classified  Localized edema  Acute bilateral low back pain with right-sided sciatica  Stiffness of right knee, not elsewhere classified  Acute pain of right knee     Problem List Patient Active Problem List   Diagnosis Date Noted  . Moderate persistent asthma without complication 25/00/3704  . Environmental and seasonal allergies 06/29/2017  . Controlled type 2 diabetes mellitus with diabetic polyneuropathy, without long-term current use of insulin ([place]) 06/29/2017  . Hearing difficulty of both ears 03/03/2017  . 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 Lichen planopilaris 88/89/1694  . Herniation of lumbar intervertebral disc with radiculopathy  10/02/2016    Class: Chronic  . Alopecia areata 08/19/2016  . Chondromalacia of both patellae 06/03/2015    Class: Chronic  . Spinal stenosis, lumbar region, with neurogenic claudication 06/03/2015  . Tobacco use disorder 04/25/2015  . DJD (degenerative joint disease) of knee 01/04/2015  . Hemorrhoid  11/14/2014  . Gout of big toe 07/19/2014  . Essential hypertension 08/14/2013  . Gastroesophageal reflux disease without esophagitis 08/14/2013  . COPD (chronic obstructive pulmonary disease) ([place]) 04/17/2011    Sumner Boast., PT 07/14/2017, 3:22 PM  [address] [place], [place], [address] Phone: [phone]   Fax:  [phone]  Name: Stacy [name] MRN: [id number] Date of Birth: Nov 08, 1960

## 2017-07-20 ENCOUNTER — Encounter: Payer: Self-pay | Admitting: Physical Therapy

## 2017-07-20 ENCOUNTER — Ambulatory Visit: Payer: Medicaid Other | Admitting: Physical Therapy

## 2017-07-20 DIAGNOSIS — R262 Difficulty in walking, not elsewhere classified: Secondary | ICD-10-CM

## 2017-07-20 DIAGNOSIS — M25562 Pain in left knee: Secondary | ICD-10-CM | POA: Diagnosis not present

## 2017-07-20 DIAGNOSIS — M25662 Stiffness of left knee, not elsewhere classified: Secondary | ICD-10-CM

## 2017-07-20 DIAGNOSIS — R600 Localized edema: Secondary | ICD-10-CM

## 2017-07-20 NOTE — Therapy (Signed)
[facility] [address] [place], [place], [address] Phone: [phone]   Fax:  [phone]  Physical Therapy Treatment  Patient Details  Name: Stacy [name] MRN: [id number] Date of Birth: 1960-02-16 Referring Provider: Marlou Sa   Encounter Date: 07/20/2017  PT End of Session - 07/20/17 0955    Visit Number  4    Number of Visits  4    PT Start Time  0923    PT Stop Time  1020    PT Time Calculation (min)  57 min    Activity Tolerance  Patient tolerated treatment well    Behavior During Therapy  [facility] for tasks assessed/performed       Past Medical History:  Diagnosis Date  . Arthritis   . Arthrofibrosis of total knee replacement ([place])    right  . Asthma   . COPD (chronic obstructive pulmonary disease) ([place])   . Diabetes mellitus    Type II  . GERD (gastroesophageal reflux disease)    Pt on Protonix daily  . Glaucoma   . Gout   . Headache(784.0)    otc meds prn  . Hyperlipidemia   . Hypertension    Pt on lisinopril  . Irritable bowel syndrome 11/19/2010  . Neuropathy   . Pneumonia   . Restless legs   . Shortness of breath    occasional - uses breathing tx at home    Past Surgical History:  Procedure Laterality Date  . CHOLECYSTECTOMY    . COLONOSCOPY    . ENDOMETRIAL ABLATION  10/2010  . HERNIA REPAIR     umbicial hernia  . KNEE ARTHROSCOPY Left    06/07/2017 Dr. Marlou Sa of [facility]  . KNEE CLOSED REDUCTION Right 12/06/2015   Procedure: CLOSED MANIPULATION RIGHT KNEE;  Surgeon: [name], MD;  Location: [facility];  Service: Orthopedics;  Laterality: Right;  . KNEE CLOSED REDUCTION Right 01/17/2016   Procedure: CLOSED MANIPULATION RIGHT KNEE;  Surgeon: [name], MD;  Location: [facility];  Service: Orthopedics;  Laterality: Right;  . KNEE JOINT MANIPULATION Right 12/06/2015  . LUMBAR DISC SURGERY  06/03/2015   L 2  L3 L4 L5   . LUMBAR LAMINECTOMY/DECOMPRESSION MICRODISCECTOMY N/A 06/03/2015   Procedure: Bilateral lateral recess decompression L2-3, L3-4, L4-5;  Surgeon: [name], MD;  Location: [place];  Service: Orthopedics;  Laterality: N/A;  . LUMBAR LAMINECTOMY/DECOMPRESSION MICRODISCECTOMY N/A 10/02/2016   Procedure: Right L5-S1 Lateral Recess Decompression  microdiscectomy;  Surgeon: [name], MD;  Location: [place];  Service: Orthopedics;  Laterality: N/A;  . svd      x 2  . TOTAL KNEE ARTHROPLASTY Right 09/06/2015   Procedure: RIGHT TOTAL KNEE ARTHROPLASTY;  Surgeon: [name], MD;  Location: [facility];  Service: Orthopedics;  Laterality: Right;  . TUBAL LIGATION    . UPPER GASTROINTESTINAL ENDOSCOPY  04/28/11    There were no vitals filed for this visit.  Subjective Assessment - 07/20/17 0936    Subjective  Patient reports that she is still hurting, walking better    Currently in Pain?  Yes    Pain Score  3     Pain Location  Knee    Pain Orientation  Right    Aggravating Factors   walking                       [facility] Adult PT Treatment/Exercise - 07/20/17 0001  Ambulation/Gait   Gait Comments  gait down stairs handrail and hand hold assist then outside and up slope and uneven surfaces      Knee/Hip Exercises: Aerobic   Recumbent Bike  full revs x 5 minutes    Nustep  level 4 x 7 minutes      Knee/Hip Exercises: Machines for Strengthening   Cybex Knee Extension  5# 3x10    Cybex Knee Flexion  20# 3x10    Cybex Leg Press  20# 3x10      Knee/Hip Exercises: Supine   Short Arc Quad Sets  Both;2 sets;10 reps;Limitations    Short Arc Quad Sets Limitations  3#    Bridges with Ball Squeeze  2 sets;10 reps    Other Supine Knee/Hip Exercises  feet on ball K2C, bridges      Vasopneumatic   Number Minutes Vasopneumatic   15 minutes    Vasopnuematic Location   Knee    Vasopneumatic Pressure  Medium    Vasopneumatic Temperature   32             PT Education - 07/20/17 0955    Education provided  Yes    Education Details  REviewed  HEP for D/C RICE and proper walking    Person(s) Educated  Patient    Methods  Explanation;Demonstration    Comprehension  Verbalized understanding;Returned demonstration       PT Short Term Goals - 07/05/17 1429      PT SHORT TERM GOAL #1   Title  I with initial HEP    Status  Achieved        PT Long Term Goals - 07/20/17 0957      PT LONG TERM GOAL #1   Title  I with advanced HEP    Status  Achieved      PT LONG TERM GOAL #2   Title  increase AROM of the left tknee to 0-120 degrees flexion    Status  Achieved      PT LONG TERM GOAL #3   Title  decreased pain 50% with ADLs    Status  Achieved      PT LONG TERM GOAL #4   Title  walk with cane for community distances with good form    Status  Achieved            Plan - 07/20/17 0956    Clinical Impression Statement  Patient overall doing well , has increased ROM, much better gait, walking mostly without a device.  Reports less pain overall    PT Next Visit Plan  D/C goals met    Consulted and Agree with Plan of Care  Patient       Patient will benefit from skilled therapeutic intervention in order to improve the following deficits and impairments:  Abnormal gait, Decreased range of motion, Difficulty walking, Decreased activity tolerance, Pain, Decreased balance, Increased edema, Decreased strength, Decreased mobility  Visit Diagnosis: Acute pain of left knee  Stiffness of left knee, not elsewhere classified  Difficulty in walking, not elsewhere classified  Localized edema     Problem List Patient Active Problem List   Diagnosis Date Noted  . Moderate persistent asthma without complication 49/17/9150  . Environmental and seasonal allergies 06/29/2017  . Controlled type 2 diabetes mellitus with diabetic polyneuropathy, without long-term current use of insulin ([place]) 06/29/2017  . Hearing difficulty of both ears 03/03/2017  . 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  .  Herniation of lumbar intervertebral disc with radiculopathy 10/02/2016    Class: Chronic  . Alopecia areata 08/19/2016  . Chondromalacia of both patellae 06/03/2015    Class: Chronic  . Spinal stenosis, lumbar region, with neurogenic claudication 06/03/2015  . Tobacco use disorder 04/25/2015  . DJD (degenerative joint disease) of knee 01/04/2015  . Hemorrhoid 11/14/2014  . Gout of big toe 07/19/2014  . Essential hypertension 08/14/2013  . Gastroesophageal reflux disease without esophagitis 08/14/2013  . COPD (chronic obstructive pulmonary disease) ([place]) 04/17/2011    Sumner Boast., PT 07/20/2017, 9:58 AM  [address] [place], [place], [address] Phone: [phone]   Fax:  [phone]  Name: Jackquline Branca MRN: [id number] Date of Birth: December 31, 1960

## 2017-07-28 ENCOUNTER — Emergency Department (HOSPITAL_COMMUNITY)
Admission: EM | Admit: 2017-07-28 | Discharge: 2017-07-28 | Disposition: A | Discharge status: Home / Self Care | Payer: Medicaid Other | Attending: Emergency Medicine | Admitting: Emergency Medicine

## 2017-07-28 ENCOUNTER — Encounter (HOSPITAL_COMMUNITY): Payer: Self-pay

## 2017-07-28 ENCOUNTER — Other Ambulatory Visit: Payer: Self-pay

## 2017-07-28 ENCOUNTER — Emergency Department (HOSPITAL_COMMUNITY): Payer: Medicaid Other

## 2017-07-28 DIAGNOSIS — F17210 Nicotine dependence, cigarettes, uncomplicated: Secondary | ICD-10-CM | POA: Insufficient documentation

## 2017-07-28 DIAGNOSIS — Z79899 Other long term (current) drug therapy: Secondary | ICD-10-CM | POA: Diagnosis not present

## 2017-07-28 DIAGNOSIS — J45909 Unspecified asthma, uncomplicated: Secondary | ICD-10-CM | POA: Diagnosis not present

## 2017-07-28 DIAGNOSIS — R05 Cough: Secondary | ICD-10-CM | POA: Diagnosis present

## 2017-07-28 DIAGNOSIS — E119 Type 2 diabetes mellitus without complications: Secondary | ICD-10-CM | POA: Insufficient documentation

## 2017-07-28 DIAGNOSIS — J069 Acute upper respiratory infection, unspecified: Secondary | ICD-10-CM | POA: Diagnosis not present

## 2017-07-28 DIAGNOSIS — I10 Essential (primary) hypertension: Secondary | ICD-10-CM | POA: Diagnosis not present

## 2017-07-28 MED ORDER — DOXYCYCLINE HYCLATE 100 MG PO CAPS: 100.0000 mg | ORAL_CAPSULE | Freq: Two times a day (BID) | ORAL | 0 refills | Status: AC

## 2017-07-28 MED ORDER — DOXYCYCLINE HYCLATE 100 MG PO CAPS: 100.0000 mg | ORAL_CAPSULE | Freq: Two times a day (BID) | ORAL | 0 refills | Status: DC

## 2017-07-28 MED ORDER — ALBUTEROL SULFATE HFA 108 (90 BASE) MCG/ACT IN AERS
2.0000 | INHALATION_SPRAY | Freq: Once | RESPIRATORY_TRACT | Status: AC
  Administered 2017-07-28: 2 via RESPIRATORY_TRACT
  Filled 2017-07-28: qty 6.7

## 2017-07-28 MED ORDER — BENZONATATE 100 MG PO CAPS: 100.0000 mg | ORAL_CAPSULE | Freq: Three times a day (TID) | ORAL | 0 refills | Status: DC

## 2017-07-28 NOTE — ED Provider Notes (Signed)
[facility] DEPT Provider Note   CSN: [id number] Arrival date & time: 07/28/17  1439     History   Chief Complaint Chief Complaint  Patient presents with  . Cough  . Otalgia    HPI Stacy [name] is a 57 y.o. female.  HPI   Patient is a 57 year old female with history of T2DM, GERD, asthma, hyperlipidemia, hypertension, COPD who presents emergency department today to be evaluated for cough that began 3 days ago.  She reports her cough is productive of green/yellow sputum.  She denies any shortness of breath or chest pain.  She denies any fevers.  She has had a bit of a scratchy throat and bilateral ear pain.  Also reports rhinorrhea, nasal congestion and postnasal drip.  She has not tried taking any medications for her symptoms at home.  Patient uses tobacco.  Past Medical History:  Diagnosis Date  . Arthritis   . Arthrofibrosis of total knee replacement ([place])    right  . Asthma   . COPD (chronic obstructive pulmonary disease) ([place])   . Diabetes mellitus    Type II  . GERD (gastroesophageal reflux disease)    Pt on Protonix daily  . Glaucoma   . Gout   . Headache(784.0)    otc meds prn  . Hyperlipidemia   . Hypertension    Pt on lisinopril  . Irritable bowel syndrome 11/19/2010  . Neuropathy   . Pneumonia   . Restless legs   . Shortness of breath    occasional - uses breathing tx at home    Patient Active Problem List   Diagnosis Date Noted  . Moderate persistent asthma without complication 83/29/1916  . Environmental and seasonal allergies 06/29/2017  . Controlled type 2 diabetes mellitus with diabetic polyneuropathy, without long-term current use of insulin ([place]) 06/29/2017  . Hearing difficulty of both ears 03/03/2017  . 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 Lichen planopilaris 60/60/0459  . Herniation of lumbar intervertebral disc with radiculopathy 10/02/2016    Class: Chronic  . Alopecia areata 08/19/2016  .  Chondromalacia of both patellae 06/03/2015    Class: Chronic  . Spinal stenosis, lumbar region, with neurogenic claudication 06/03/2015  . Tobacco use disorder 04/25/2015  . DJD (degenerative joint disease) of knee 01/04/2015  . Hemorrhoid 11/14/2014  . Gout of big toe 07/19/2014  . Essential hypertension 08/14/2013  . Gastroesophageal reflux disease without esophagitis 08/14/2013  . COPD (chronic obstructive pulmonary disease) ([place]) 04/17/2011    Past Surgical History:  Procedure Laterality Date  . CHOLECYSTECTOMY    . COLONOSCOPY    . ENDOMETRIAL ABLATION  10/2010  . HERNIA REPAIR     umbicial hernia  . KNEE ARTHROSCOPY Left    06/07/2017 Dr. Marlou Sa of [facility]  . KNEE CLOSED REDUCTION Right 12/06/2015   Procedure: CLOSED MANIPULATION RIGHT KNEE;  Surgeon: [name], MD;  Location: [facility];  Service: Orthopedics;  Laterality: Right;  . KNEE CLOSED REDUCTION Right 01/17/2016   Procedure: CLOSED MANIPULATION RIGHT KNEE;  Surgeon: [name], MD;  Location: [facility];  Service: Orthopedics;  Laterality: Right;  . KNEE JOINT MANIPULATION Right 12/06/2015  . LUMBAR DISC SURGERY  06/03/2015   L 2  L3 L4 L5   . LUMBAR LAMINECTOMY/DECOMPRESSION MICRODISCECTOMY N/A 06/03/2015   Procedure: Bilateral lateral recess decompression L2-3, L3-4, L4-5;  Surgeon: [name], MD;  Location: [facility];  Service: Orthopedics;  Laterality: N/A;  . LUMBAR LAMINECTOMY/DECOMPRESSION MICRODISCECTOMY N/A 10/02/2016  Procedure: Right L5-S1 Lateral Recess Decompression  microdiscectomy;  Surgeon: [name], MD;  Location: [facility];  Service: Orthopedics;  Laterality: N/A;  . svd      x 2  . TOTAL KNEE ARTHROPLASTY Right 09/06/2015   Procedure: RIGHT TOTAL KNEE ARTHROPLASTY;  Surgeon: [name], MD;  Location: [facility];  Service: Orthopedics;  Laterality: Right;  . TUBAL LIGATION    . UPPER GASTROINTESTINAL ENDOSCOPY  04/28/11     OB History   None      Home Medications    Prior to Admission  medications   Medication Sig Start Date End Date Taking? Authorizing Provider  acetaminophen-codeine (TYLENOL #3) 300-30 MG tablet Take 1 tablet by mouth every 4 (four) hours as needed. 05/10/17   [name], MD  albuterol (PROVENTIL) (2.5 MG/3ML) 0.083% nebulizer solution USE 1 VIAL VIA NEBULIZER EVERY 6 HOURS AS NEEDED FOR WHEEZING 06/29/17   [name], MD  allopurinol (ZYLOPRIM) 100 MG tablet Take 1 tablet (100 mg total) by mouth daily. 11/23/16   Tresa Garter, MD  Blood Glucose Monitoring Suppl (ACCU-CHEK AVIVA PLUS) w/Device KIT 1 each by Does not apply route 3 (three) times daily. 06/10/16   Tresa Garter, MD  budesonide-formoterol (SYMBICORT) 80-4.5 MCG/ACT inhaler INHALE TWO PUFFS BY MOUTH TWICE A DAY 02/17/17   Tresa Garter, MD  celecoxib (CELEBREX) 200 MG capsule Take 1 capsule (200 mg total) by mouth 2 (two) times daily. 05/10/17   [name], MD  cholecalciferol (VITAMIN D) 1000 units tablet Take 1,000 Units by mouth daily.    [provider]  diclofenac sodium (VOLTAREN) 1 % GEL Apply 4 g topically 4 (four) times daily. 11/26/16   [name], MD  doxycycline (VIBRAMYCIN) 100 MG capsule Take 1 capsule (100 mg total) by mouth 2 (two) times daily for 7 days. 07/28/17 08/04/17  Lilymarie Scroggins S, PA-C  EPINEPHrine 0.3 mg/0.3 mL IJ SOAJ injection 0.3 mg IM x 1 PRN for allergic reaction 05/17/17   [name], MD  fluocinonide (LIDEX) 0.05 % external solution Apply 1 application topically 2 (two) times daily. 10/09/16   Kinnie Feil, MD  fluticasone (FLONASE) 50 MCG/ACT nasal spray Place 1 spray into both nostrils daily. 06/29/17   [name], MD  gabapentin (NEURONTIN) 300 MG capsule Take 1 capsule (300 mg total) by mouth 3 (three) times daily. 11/23/16   Tresa Garter, MD  glimepiride (AMARYL) 2 MG tablet Take 1 tablet (2 mg total) by mouth daily before breakfast. 06/29/17   [name], MD  glucose blood (TRUE METRIX  BLOOD GLUCOSE TEST) test strip USE AS DIRECTED BY PHYSICIAN 11/23/16   Tresa Garter, MD  hydrochlorothiazide (HYDRODIURIL) 25 MG tablet Take 25 mg by mouth daily. 02/18/17   [provider]  hydrOXYzine (ATARAX/VISTARIL) 10 MG tablet 1 tab PO Q a.m and noon and 2 tabs PO Q p.m PRN 04/02/17   [name], MD  Lancets (ACCU-CHEK SOFT [facility]) lancets Use as instructed 11/23/16   Tresa Garter, MD  loratadine (CLARITIN) 10 MG tablet Take 1 tablet (10 mg total) by mouth daily. 06/29/17   [name], MD  methocarbamol (ROBAXIN) 500 MG tablet Take 1 tablet (500 mg total) by mouth every 6 (six) hours as needed for muscle spasms. 07/02/17   [name], MD  MITIGARE 0.6 MG CAPS Take 1 capsule by mouth daily. 01/20/17   Tresa Garter, MD  Multiple Vitamin (MULTIVITAMIN WITH MINERALS) TABS tablet Take 1 tablet by mouth daily.    [provider]  NICOTINE STEP 2 14 MG/24HR patch PLACE 1 PATCH ONTO THE SKIN DAILY 07/02/17   [name], MD  pantoprazole (PROTONIX) 40 MG tablet Take 1 tablet (40 mg total) by mouth daily. 11/23/16   Tresa Garter, MD  simvastatin (ZOCOR) 20 MG tablet Take 1 tablet (20 mg total) by mouth at bedtime. 11/23/16   Tresa Garter, MD  sucralfate (CARAFATE) 1 g tablet Take 1 g by mouth 4 (four) times daily -  with meals and at bedtime.    [provider]  valsartan-hydrochlorothiazide (DIOVAN-HCT) 160-12.5 MG tablet Take 1 tablet by mouth daily. 11/23/16   Tresa Garter, MD  Fluticasone-Salmeterol (ADVAIR) 500-50 MCG/DOSE AEPB Inhale 1 puff into the lungs every 12 (twelve) hours.    04/16/11  [provider]  lisinopril (PRINIVIL,ZESTRIL) 40 MG tablet Take 40 mg by mouth daily.    04/16/11  [provider]    Family History Family History  Problem Relation Age of Onset  . Hypertension Father   . Cancer Father   . Heart disease Mother   . Asthma Son        had as a child  .  Heart disease Sister   . Breast cancer Sister   . Hypertension Brother     Social History Social History   Tobacco Use  . Smoking status: Current Some Day Smoker    Packs/day: 0.25    Years: 32.00    Pack years: 8.00    Types: Cigarettes  . Smokeless tobacco: Never Used  Substance Use Topics  . Alcohol use: No  . Drug use: No     Allergies   Other; Ace inhibitors; and Aspirin   Review of Systems Review of Systems  Constitutional: Negative for chills and fever.  HENT: Positive for congestion, ear pain, postnasal drip, rhinorrhea and sore throat. Negative for trouble swallowing and voice change.   Eyes: Negative for pain and visual disturbance.  Respiratory: Positive for cough. Negative for shortness of breath and wheezing.   Cardiovascular: Negative for chest pain, palpitations and leg swelling.  Gastrointestinal: Negative for abdominal pain, constipation, diarrhea, nausea and vomiting.  Genitourinary: Negative for dysuria and hematuria.  Musculoskeletal: Negative for back pain.  Skin: Negative for color change and rash.  Neurological: Negative for dizziness, weakness, light-headedness, numbness and headaches.  All other systems reviewed and are negative.  Physical Exam Updated Vital Signs BP 125/84   Pulse 100   Temp 98.5 F (36.9 C) (Oral)   Resp 15   Ht 5' 6"  (1.676 m)   Wt 72.6 kg (160 lb)   SpO2 100%   BMI 25.82 kg/m   Physical Exam  Constitutional: She appears well-developed and well-nourished. No distress.  HENT:  Head: Normocephalic and atraumatic.  No pharyngeal erythema.  No tonsillar swelling or exudates.  Uvula midline.  Bilateral TMs without erythema or effusion.  Bilateral nasal turbinates are swollen.  Eyes: Pupils are equal, round, and reactive to light. Conjunctivae and EOM are normal.  Neck: Normal range of motion. Neck supple.  Cardiovascular: Normal rate, regular rhythm, normal heart sounds and intact distal pulses.  No murmur  heard. Pulmonary/Chest: Effort normal and breath sounds normal.  Dry cough on exam. No wheezing, rales, or rhonchi  Abdominal: Soft. Bowel sounds are normal. She exhibits no distension. There is no tenderness. There is no guarding.  Musculoskeletal: She  exhibits no edema.  Lymphadenopathy:    She has no cervical adenopathy.  Neurological: She is alert.  Skin: Skin is warm and dry. Capillary refill takes less than 2 seconds.  Psychiatric: She has a normal mood and affect.  Nursing note and vitals reviewed.    ED Treatments / Results  Labs (all labs ordered are listed, but only abnormal results are displayed) Labs Reviewed - No data to display  EKG None  Radiology Dg Chest 2 View  Result Date: 07/28/2017 CLINICAL DATA:  Initial evaluation for unresolved cough for 3 days, history of COPD. EXAM: CHEST - 2 VIEW COMPARISON:  Prior radiograph from 08/30/2015. FINDINGS: Mild cardiomegaly, stable. Mediastinal silhouette within normal limits. Aortic atherosclerosis. Lungs hyperinflated with underlying COPD. Scattered bilateral pleuroparenchymal scarring, stable. No focal infiltrates. No pulmonary edema or pleural effusion. No pneumothorax. No acute osseous abnormality. IMPRESSION: No active cardiopulmonary disease. Electronically Signed   By: Jeannine Boga M.D.   On: 07/28/2017 17:50    Procedures Procedures (including critical care time)  Medications Ordered in ED Medications  albuterol (PROVENTIL HFA;VENTOLIN HFA) 108 (90 Base) MCG/ACT inhaler 2 puff (has no administration in time range)     Initial Impression / Assessment and Plan / ED Course  I have reviewed the triage vital signs and the nursing notes.  Pertinent labs & imaging results that were available during my care of the patient were reviewed by me and considered in my medical decision making (see chart for details).     Final Clinical Impressions(s) / ED Diagnoses   Final diagnoses:  Upper respiratory tract  infection, unspecified type   Patient presenting with cough, rhinorrhea, nasal congestion for the last 3 days.  Vital signs stable the ED.  Borderline tachycardia initially.  Patient's lungs are clear on exam.  Cardiac exam is benign.  HEENT exam is benign.  Chest x-ray negative for pneumonia.  Suspect pt has upper respiratory infection. Given patient has a history of COPD and tobacco use, will initiate antibiotic therapy for patient's symptoms.  Also gave albuterol inhaler in the ED and advised patient to use this every 6 hours as needed. Will give cough medication as well.  Advised hydration and close follow-up with her PCP next week.  Advised to return to the ER for any new or worsening symptoms.  ED Discharge Orders        Ordered    doxycycline (VIBRAMYCIN) 100 MG capsule  2 times daily     07/28/17 [address] Karel Turpen Chauncey Cruel, PA-C 07/28/17 1814    Davonna Belling, MD 07/29/17 [phone]

## 2017-07-28 NOTE — ED Notes (Signed)
Pt ambulatory to xray.

## 2017-07-28 NOTE — Discharge Instructions (Signed)
You were given a prescription for antibiotics. Please take the antibiotic prescription fully.   You were also given an albuterol inhaler which she may use every 6 hours as needed.  You are also given a cough medication to use as needed for your cough.  I have prescribed a new medication for you today. It is important that when you pick the prescription up you discuss the potential interactions of this medication with other medications you are taking, including over the counter medications, with the pharmacists.   This new medication has potential side effects. Be sure to contact your primary care provider or return to the emergency department if you are experiencing new symptoms that you are unable to tolerate after starting the medication. You need to receive medical evaluation immediately if you start to experience blistering of the skin, rash, swelling, or difficulty breathing as these signs could indicate a more serious medication side effect.   Please follow up with your primary care provider within 5-7 days for re-evaluation of your symptoms. If you do not have a primary care provider, information for a healthcare clinic has been provided for you to make arrangements for follow up care. Please return to the emergency department for any new or worsening symptoms.

## 2017-07-29 ENCOUNTER — Ambulatory Visit (INDEPENDENT_AMBULATORY_CARE_PROVIDER_SITE_OTHER): Payer: Medicaid Other

## 2017-07-29 ENCOUNTER — Encounter (INDEPENDENT_AMBULATORY_CARE_PROVIDER_SITE_OTHER): Payer: Self-pay | Admitting: Surgery

## 2017-07-29 ENCOUNTER — Telehealth (INDEPENDENT_AMBULATORY_CARE_PROVIDER_SITE_OTHER): Payer: Self-pay | Admitting: Specialist

## 2017-07-29 ENCOUNTER — Ambulatory Visit (HOSPITAL_COMMUNITY)
Admission: RE | Admit: 2017-07-29 | Discharge: 2017-07-29 | Disposition: A | Discharge status: Home / Self Care | Payer: Medicaid Other | Source: Ambulatory Visit | Attending: Surgery | Admitting: Surgery

## 2017-07-29 ENCOUNTER — Other Ambulatory Visit (INDEPENDENT_AMBULATORY_CARE_PROVIDER_SITE_OTHER): Payer: Self-pay | Admitting: Surgery

## 2017-07-29 ENCOUNTER — Ambulatory Visit (INDEPENDENT_AMBULATORY_CARE_PROVIDER_SITE_OTHER): Payer: Medicaid Other | Admitting: Surgery

## 2017-07-29 VITALS — BP 104/62 | HR 85 | Ht 66.0 in | Wt 160.0 lb

## 2017-07-29 DIAGNOSIS — M79661 Pain in right lower leg: Secondary | ICD-10-CM

## 2017-07-29 DIAGNOSIS — M25461 Effusion, right knee: Secondary | ICD-10-CM | POA: Insufficient documentation

## 2017-07-29 DIAGNOSIS — M25561 Pain in right knee: Secondary | ICD-10-CM | POA: Diagnosis not present

## 2017-07-29 DIAGNOSIS — M7989 Other specified soft tissue disorders: Secondary | ICD-10-CM | POA: Insufficient documentation

## 2017-07-29 DIAGNOSIS — M79604 Pain in right leg: Secondary | ICD-10-CM | POA: Diagnosis not present

## 2017-07-29 DIAGNOSIS — G8929 Other chronic pain: Secondary | ICD-10-CM

## 2017-07-29 NOTE — Telephone Encounter (Signed)
I put pt on the cancellation list 

## 2017-07-29 NOTE — Telephone Encounter (Signed)
Stacy FearingJames told patient to follow up with Dr. Otelia SergeantNitka in a week. Please call patient if we can get her an appt. # [phone]

## 2017-07-29 NOTE — Progress Notes (Signed)
Office Visit Note   Patient: Stacy [name]           Date of Birth: 08-25-1960           MRN: [id number] Visit Date: 07/29/2017              Requested by: Marcine MatarJohnson, Deborah B, MD [address] [place], [place] [id number] PCP: Marcine MatarJohnson, Deborah B, MD   Assessment & Plan: Visit Diagnoses:  1. Chronic pain of right knee   2. Right calf pain     Plan: With patient's worsening posterior knee/calf pain and swelling I recommend that she go for venous Doppler to rule out DVT this evening.  [facility] at [facility].  Patient does have a ride this evening.  I will await callback report.  Follow-Up Instructions: Return in about 1 week (around 08/05/2017) for With Dr. Otelia SergeantNitka.   Orders:  Orders Placed This Encounter  Procedures  . XR Knee 1-2 Views Right   No orders of the defined types were placed in this encounter.     Procedures: No procedures performed   Clinical Data: No additional findings.   Subjective: Chief Complaint  Patient presents with  . Right Knee - Pain    HPI Patient comes in today with complaints of worsening right posterior knee calf pain and swelling.  States that this is been ongoing x2 to 3 weeks.  No complaint of chest pain shortness of breath.  She is status post right total knee replacement with manipulations under anesthesia.  Pain when she is ambulating.  No injury.  She has had some cramping in the right calf.  She was doing recently well until the onset of symptoms. Review of Systems No current cardiac pulmonary GI GU issues  Objective: Vital Signs: BP 104/62 (BP Location: Left Arm, Patient Position: Sitting, Cuff Size: Normal)   Pulse 85   Ht 5\' 6"  (1.676 m)   Wt 160 lb (72.6 kg)   BMI 25.82 kg/m   Physical Exam  Constitutional: She is oriented to person, place, and time. No distress.  Patient does appear to have a moderate level discomfort in the right lower extremity when she is ambulating.  HENT:  Head:  Normocephalic and atraumatic.  Neck: Normal range of motion.  Pulmonary/Chest: No respiratory distress.  Musculoskeletal:  Gait antalgic.  No palpable knee effusion.  Anterior knee is nontender.  Patient does have posterior knee swelling and tenderness.  Right calf is also swollen with moderate to marked calf tenderness.  Pain with Homans testing.  Neurovascular intact.  Neurological: She is alert and oriented to person, place, and time.  Skin: Skin is warm and dry.    Ortho Exam  Specialty Comments:  No specialty comments available.  Imaging: Dg Chest 2 View  Result Date: 07/28/2017 CLINICAL DATA:  Initial evaluation for unresolved cough for 3 days, history of COPD. EXAM: CHEST - 2 VIEW COMPARISON:  Prior radiograph from 08/30/2015. FINDINGS: Mild cardiomegaly, stable. Mediastinal silhouette within normal limits. Aortic atherosclerosis. Lungs hyperinflated with underlying COPD. Scattered bilateral pleuroparenchymal scarring, stable. No focal infiltrates. No pulmonary edema or pleural effusion. No pneumothorax. No acute osseous abnormality. IMPRESSION: No active cardiopulmonary disease. Electronically Signed   By: Rise MuBenjamin  McClintock M.D.   On: 07/28/2017 17:50     PMFS History: Patient Active Problem List   Diagnosis Date Noted  . Moderate persistent asthma without complication 06/29/2017  . Environmental and seasonal allergies 06/29/2017  . Controlled type 2  diabetes mellitus with diabetic polyneuropathy, without long-term current use of insulin ([facility]) 06/29/2017  . Hearing difficulty of both ears 03/03/2017  . 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 Herniation of lumbar intervertebral disc with radiculopathy 10/02/2016    Class: Chronic  . Alopecia areata 08/19/2016  . Chondromalacia of both patellae 06/03/2015    Class: Chronic  . Spinal stenosis, lumbar region, with neurogenic claudication 06/03/2015  . Tobacco use disorder 04/25/2015    . DJD (degenerative joint disease) of knee 01/04/2015  . Hemorrhoid 11/14/2014  . Gout of big toe 07/19/2014  . Essential hypertension 08/14/2013  . Gastroesophageal reflux disease without esophagitis 08/14/2013  . COPD (chronic obstructive pulmonary disease) ([facility]) 04/17/2011   Past Medical History:  Diagnosis Date  . Arthritis   . 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 Asthma   . COPD (chronic obstructive pulmonary disease) ([facility])   . Diabetes mellitus    Type II  . GERD (gastroesophageal reflux disease)    Pt on Protonix daily  . Glaucoma   . Gout   . Headache(784.0)    otc meds prn  . Hyperlipidemia   . Hypertension    Pt on lisinopril  . Irritable bowel syndrome 11/19/2010  . Neuropathy   . Pneumonia   . Restless legs   . Shortness of breath    occasional - uses breathing tx at home    Family History  Problem Relation Age of Onset  . Hypertension Father   . Cancer Father   . Heart disease Mother   . Asthma Son        had as a child  . Heart disease Sister   . Breast cancer Sister   . Hypertension Brother     Past Surgical History:  Procedure Laterality Date  . CHOLECYSTECTOMY    . COLONOSCOPY    . ENDOMETRIAL ABLATION  10/2010  . HERNIA REPAIR     umbicial hernia  . KNEE ARTHROSCOPY Left    06/07/2017 Dr. August Saucer [facility]  . KNEE CLOSED REDUCTION Right 12/06/2015   Procedure: CLOSED MANIPULATION RIGHT KNEE;  Surgeon: Kerrin Champagne, MD;  Location: [facility] OR;  Service: Orthopedics;  Laterality: Right;  . KNEE CLOSED REDUCTION Right 01/17/2016   Procedure: CLOSED MANIPULATION RIGHT KNEE;  Surgeon: Kerrin Champagne, MD;  Location: [facility] OR;  Service: Orthopedics;  Laterality: Right;  . KNEE JOINT MANIPULATION Right 12/06/2015  . LUMBAR DISC SURGERY  06/03/2015   L 2  L3 L4 L5   . LUMBAR LAMINECTOMY/DECOMPRESSION MICRODISCECTOMY N/A 06/03/2015   Procedure: Bilateral lateral recess decompression L2-3, L3-4, L4-5;  Surgeon: Kerrin Champagne, MD;   Location: [facility] OR;  Service: Orthopedics;  Laterality: N/A;  . LUMBAR LAMINECTOMY/DECOMPRESSION MICRODISCECTOMY N/A 10/02/2016   Procedure: Right L5-S1 Lateral Recess Decompression  microdiscectomy;  Surgeon: Kerrin Champagne, MD;  [facility];  Service: Orthopedics;  Laterality: N/A;  . svd      x 2  . TOTAL KNEE ARTHROPLASTY Right 09/06/2015   Procedure: RIGHT TOTAL KNEE ARTHROPLASTY;  Surgeon: Kerrin Champagne, MD;  Location: [facility];  Service: Orthopedics;  Laterality: Right;  . TUBAL LIGATION    . UPPER GASTROINTESTINAL ENDOSCOPY  04/28/11   Social History   Occupational History  . Occupation: unemployed    Associate Professor: UNEMPLOYED  Tobacco Use  . Smoking status: Current Some Day Smoker    Packs/day: 0.25    Years: 32.00  Pack years: 8.00    Types: Cigarettes  . Smokeless tobacco: Never Used  Substance and Sexual Activity  . Alcohol use: No  . Drug use: No  . Sexual activity: Yes    Birth control/protection: Surgical

## 2017-07-30 ENCOUNTER — Telehealth (INDEPENDENT_AMBULATORY_CARE_PROVIDER_SITE_OTHER): Payer: Self-pay | Admitting: Radiology

## 2017-07-30 ENCOUNTER — Encounter (INDEPENDENT_AMBULATORY_CARE_PROVIDER_SITE_OTHER): Payer: Self-pay | Admitting: Surgery

## 2017-07-30 ENCOUNTER — Ambulatory Visit (INDEPENDENT_AMBULATORY_CARE_PROVIDER_SITE_OTHER): Payer: Medicaid Other | Admitting: Orthopedic Surgery

## 2017-07-30 ENCOUNTER — Ambulatory Visit (HOSPITAL_COMMUNITY): Payer: Medicaid Other

## 2017-07-30 DIAGNOSIS — G8929 Other chronic pain: Secondary | ICD-10-CM

## 2017-07-30 DIAGNOSIS — M25561 Pain in right knee: Secondary | ICD-10-CM

## 2017-07-30 NOTE — Telephone Encounter (Signed)
error 

## 2017-07-30 NOTE — Telephone Encounter (Signed)
Patient requests new documentation letter for receiving first floor apartment.

## 2017-07-30 NOTE — Progress Notes (Signed)
To whom it may concern:  [name] date of birth October 27, 1960 is a 57 year old black  female who has been an established patient in our practice for almost 3 years.  Patient has again requested a letter confirming the need to change domicile to an apartment or living place on the ground floor.  Her physical conditions involve bilateral knee arthritis and has undergone right total knee replacement with residual stiffness and soreness.  She has also undergone lumbar surgeries.  Her orthopedic conditions cause sniffing hardship when walking up or down stairs due to both lumbar disc issues and bilateral knee problems.  I again recommend that she be allowed to be placed in a ground floor apartment or residents to decrease the discomfort associated with her current conditions as I also previously documented in the letter that was done on October 02, 2016.  If there are any further questions or concerns regarding this matter please contact me immediately at [phone].  Also please forward any correspondence to my office address.  Thank you for your time and attention to this matter.    Sincerely,      Kerrin ChampagneJames E. Nitka MD [facility]

## 2017-07-30 NOTE — Progress Notes (Signed)
Patient went for her venous Doppler right lower extremity last night.  Report showed:  CLINICAL DATA:  Right knee and calf pain for 2 weeks. History of right knee arthroplasty.  EXAM: RIGHT LOWER EXTREMITY VENOUS DOPPLER ULTRASOUND  TECHNIQUE: Gray-scale sonography with graded compression, as well as color Doppler and duplex ultrasound were performed to evaluate the lower extremity deep venous systems from the level of the common femoral vein and including the common femoral, femoral, profunda femoral, popliteal and calf veins including the posterior tibial, peroneal and gastrocnemius veins when visible. The superficial great saphenous vein was also interrogated. Spectral Doppler was utilized to evaluate flow at rest and with distal augmentation maneuvers in the common femoral, femoral and popliteal veins.  COMPARISON:  None.  FINDINGS: Contralateral Common Femoral Vein: Respiratory phasicity is normal and symmetric with the symptomatic side. No evidence of thrombus. Normal compressibility.  Common Femoral Vein: No evidence of thrombus. Normal compressibility, respiratory phasicity and response to augmentation.  Saphenofemoral Junction: No evidence of thrombus. Normal compressibility and flow on color Doppler imaging.  Profunda Femoral Vein: No evidence of thrombus. Normal compressibility and flow on color Doppler imaging.  Femoral Vein: No evidence of thrombus. Normal compressibility, respiratory phasicity and response to augmentation.  Popliteal Vein: No evidence of thrombus. Normal compressibility, respiratory phasicity and response to augmentation.  Calf Veins: No evidence of thrombus. Normal compressibility and flow on color Doppler imaging.  Superficial Great Saphenous Vein: No evidence of thrombus. Normal compressibility.  Venous Reflux:  None.  Other Findings: Right knee joint effusion is demonstrated in the suprapatellar region. Fluid collection  in the medial right knee measures 5.7 x 0.8 x 3.5 cm.  IMPRESSION: No evidence of deep venous thrombosis in the right lower extremity.  Suprapatellar right knee joint effusion demonstrated.  Nonspecific medial right knee fluid collection as detailed, which may represent a Baker's cyst or bursal collection.   I spoke with patient this morning and went over the results.  I think it would be worthwhile to try a Baker's cyst aspiration.  Dr. Dorene GrebeScott Dean in our office also went over the study with me and he will perform ultrasound-guided Baker's cyst aspiration this morning.  Recommend follow-up with Dr. Otelia SergeantNitka in 1 week for recheck.

## 2017-07-31 ENCOUNTER — Other Ambulatory Visit: Payer: Self-pay | Admitting: Internal Medicine

## 2017-07-31 ENCOUNTER — Encounter (INDEPENDENT_AMBULATORY_CARE_PROVIDER_SITE_OTHER): Payer: Self-pay | Admitting: Orthopedic Surgery

## 2017-07-31 DIAGNOSIS — E119 Type 2 diabetes mellitus without complications: Secondary | ICD-10-CM

## 2017-07-31 DIAGNOSIS — I10 Essential (primary) hypertension: Secondary | ICD-10-CM

## 2017-07-31 NOTE — Progress Notes (Signed)
Post-Op Visit Note   Patient: Stacy [name]           Date of Birth: Dec 03, 1960           MRN: [id number] Visit Date: 07/30/2017 PCP: Marcine MatarJohnson, Deborah B, MD   Assessment & Plan:  Chief Complaint:  Chief Complaint  Patient presents with  . Right Knee - Pain   Visit Diagnoses:  1. Chronic pain of right knee     Plan: Rinaldo Cloudamela comes in today with knee pain.  Her ultrasound suggested that she had an aspirateable Baker's cyst of the knee.  We looked with ultrasound and could not find such a lesion.  [facility] higher for potential aspiration and injection of a Baker's cyst.  No charge for today's visit.  Follow-Up Instructions: Return if symptoms worsen or fail to improve.   Orders:  No orders of the defined types were placed in this encounter.  No orders of the defined types were placed in this encounter.   Imaging: No results found.  PMFS History: Patient Active Problem List   Diagnosis Date Noted  . Moderate persistent asthma without complication 06/29/2017  . Environmental and seasonal allergies 06/29/2017  . Controlled type 2 diabetes mellitus with diabetic polyneuropathy, without long-term current use of insulin (HCC) 06/29/2017  . Hearing difficulty of both ears 03/03/2017  . 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 Herniation of lumbar intervertebral disc with radiculopathy 10/02/2016    Class: Chronic  . Alopecia areata 08/19/2016  . Chondromalacia of both patellae 06/03/2015    Class: Chronic  . Spinal stenosis, lumbar region, with neurogenic claudication 06/03/2015  . Tobacco use disorder 04/25/2015  . DJD (degenerative joint disease) of knee 01/04/2015  . Hemorrhoid 11/14/2014  . Gout of big toe 07/19/2014  . Essential hypertension 08/14/2013  . Gastroesophageal reflux disease without esophagitis 08/14/2013  . COPD (chronic obstructive pulmonary  disease) (HCC) 04/17/2011   Past Medical History:  Diagnosis Date  . Arthritis   . 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 Asthma   . COPD (chronic obstructive pulmonary disease) (HCC)   . Diabetes mellitus    Type II  . GERD (gastroesophageal reflux disease)    Pt on Protonix daily  . Glaucoma   . Gout   . Headache(784.0)    otc meds prn  . Hyperlipidemia   . Hypertension    Pt on lisinopril  . Irritable bowel syndrome 11/19/2010  . Neuropathy   . Pneumonia   . Restless legs   . Shortness of breath    occasional - uses breathing tx at home    Family History  Problem Relation Age of Onset  . Hypertension Father   . Cancer Father   . Heart disease Mother   . Asthma Son        had as a child  . Heart disease Sister   . Breast cancer Sister   . Hypertension Brother     Past Surgical History:  Procedure Laterality Date  . CHOLECYSTECTOMY    . COLONOSCOPY    . ENDOMETRIAL ABLATION  10/2010  . HERNIA REPAIR     umbicial hernia  . KNEE ARTHROSCOPY Left    [facility]07[facility]  . KNEE CLOSED REDUCTION Right 12/06/2015   Procedure: CLOSED MANIPULATION RIGHT KNEE;  Surgeon: Kerrin ChampagneJames E Nitka, MD;  Location: [facility] OR;  Service: Orthopedics;  Laterality: Right;  . KNEE CLOSED REDUCTION Right 01/17/2016   Procedure: CLOSED MANIPULATION RIGHT KNEE;  Surgeon: Kerrin Champagne, MD;  Location: [facility] OR;  Service: Orthopedics;  Laterality: Right;  . KNEE JOINT MANIPULATION Right 12/06/2015  . LUMBAR DISC SURGERY  06/03/2015   L 2  L3 L4 L5   . LUMBAR LAMINECTOMY/DECOMPRESSION MICRODISCECTOMY N/A 06/03/2015   Procedure: Bilateral lateral recess decompression L2-3, L3-4, L4-5;  Surgeon: Kerrin Champagne, MD;  Location: [facility] OR;  Service: Orthopedics;  Laterality: N/A;  . LUMBAR LAMINECTOMY/DECOMPRESSION MICRODISCECTOMY N/A 10/02/2016   Procedure: Right L5-S1 Lateral Recess Decompression  microdiscectomy;  Surgeon: Kerrin Champagne, MD;  Location: [facility] OR;  Service:  Orthopedics;  Laterality: N/A;  . svd      x 2  . TOTAL KNEE ARTHROPLASTY Right 09/06/2015   Procedure: RIGHT TOTAL KNEE ARTHROPLASTY;  Surgeon: Kerrin Champagne, MD;  Location: [facility] OR;  Service: Orthopedics;  Laterality: Right;  . TUBAL LIGATION    . UPPER GASTROINTESTINAL ENDOSCOPY  04/28/11   Social History   Occupational History  . Occupation: unemployed    Associate Professor: UNEMPLOYED  Tobacco Use  . Smoking status: Current Some Day Smoker    Packs/day: 0.25    Years: 32.00    Pack years: 8.00    Types: Cigarettes  . Smokeless tobacco: Never Used  Substance and Sexual Activity  . Alcohol use: No  . Drug use: No  . Sexual activity: Yes    Birth control/protection: Surgical

## 2017-08-02 ENCOUNTER — Other Ambulatory Visit (INDEPENDENT_AMBULATORY_CARE_PROVIDER_SITE_OTHER): Payer: Self-pay | Admitting: Surgery

## 2017-08-02 ENCOUNTER — Telehealth: Payer: Self-pay | Admitting: *Deleted

## 2017-08-02 DIAGNOSIS — J301 Allergic rhinitis due to pollen: Secondary | ICD-10-CM | POA: Diagnosis not present

## 2017-08-02 DIAGNOSIS — M7121 Synovial cyst of popliteal space [Baker], right knee: Secondary | ICD-10-CM

## 2017-08-02 DIAGNOSIS — J3081 Allergic rhinitis due to animal (cat) (dog) hair and dander: Secondary | ICD-10-CM | POA: Diagnosis not present

## 2017-08-02 DIAGNOSIS — J3089 Other allergic rhinitis: Secondary | ICD-10-CM | POA: Diagnosis not present

## 2017-08-02 NOTE — Telephone Encounter (Signed)
[name] to sch bakers cyst inj/asp./vm

## 2017-08-03 ENCOUNTER — Encounter (INDEPENDENT_AMBULATORY_CARE_PROVIDER_SITE_OTHER): Payer: Self-pay | Admitting: Specialist

## 2017-08-03 ENCOUNTER — Ambulatory Visit (INDEPENDENT_AMBULATORY_CARE_PROVIDER_SITE_OTHER): Payer: Medicaid Other | Admitting: Specialist

## 2017-08-03 VITALS — BP 120/76 | HR 80 | Ht 66.0 in | Wt 160.0 lb

## 2017-08-03 DIAGNOSIS — Z96651 Presence of right artificial knee joint: Secondary | ICD-10-CM

## 2017-08-03 DIAGNOSIS — M109 Gout, unspecified: Secondary | ICD-10-CM

## 2017-08-03 DIAGNOSIS — M25561 Pain in right knee: Secondary | ICD-10-CM

## 2017-08-03 DIAGNOSIS — T8484XD Pain due to internal orthopedic prosthetic devices, implants and grafts, subsequent encounter: Secondary | ICD-10-CM | POA: Diagnosis not present

## 2017-08-03 DIAGNOSIS — G8929 Other chronic pain: Secondary | ICD-10-CM

## 2017-08-03 MED ORDER — DICLOFENAC SODIUM 1 % TD GEL: 4.0000 g | Freq: Four times a day (QID) | TRANSDERMAL | 1 refills | Status: DC

## 2017-08-03 MED ORDER — ALLOPURINOL 100 MG PO TABS: 100.0000 mg | ORAL_TABLET | Freq: Every day | ORAL | 3 refills | Status: DC

## 2017-08-03 MED ORDER — CELECOXIB 200 MG PO CAPS: 200.0000 mg | ORAL_CAPSULE | Freq: Two times a day (BID) | ORAL | 3 refills | Status: DC

## 2017-08-03 NOTE — Patient Instructions (Signed)
  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 Well padded shoes help. Ice the knee 2-3 times a day 15-20 mins at a time. Pool walking or stationary bike can be of benefit. The right knee replacement continues to hurt nearly 2 years post op right knee replacement, CTScan of the pelvis suggest some posterior joint line Ultrasound of the right knee, consider a bone scan of the right knee if pain persists to determine if there is abnormal bone formation that can be a clue As to the source of your pain.

## 2017-08-03 NOTE — Progress Notes (Addendum)
Office Visit Note   Patient: Stacy [name]           Date of Birth: August 29, 1960           MRN: [id number] Visit Date: 08/03/2017              Requested by: Marcine Matar, MD [address] [place], [place] [address] PCP: Marcine Matar, MD   Assessment & Plan: Visit Diagnoses:  1. Chronic pain of right knee   2. Pain due to total right knee replacement, subsequent encounter   57 year old female returns today with complaints of severe right knee pain. She is status post right TKR almost 2 year ago less pain at her visit in 04/2017. At that time her pain was primarily left knee pain. She has since had left knee arthroscopy and now the right knee is painful. No narcotics Which is an improvement compared with her pre knee replacement status and she is walking and not in a wheel chair or using knee braces. She report an ultrasound of the Right knee was done at the [facility] and It showed no blood clots in her veins but she was told that there is a cyst present behind the right knee and she believes this is causing her pain. Saw Dr. August Saucer and he reportedly could not feel or see the cyst on in office ultrasound so she is  Scheduled to have an Ultrasound guided aspiration of the  Right knee posterior cyst at [facility]. Her preop MRI from 2017 did not indicate a popliteal cyst and I have indicated to Stacy [name] that I don't expect her to have a cyst. Clinically some fullness of the right knee posteriorly but no definite popliteal cyst. Recommend she have the study and aspiration if the cyst is present. Today lab to rule out infection. She was placed on doxycycline by the [facility] for a URTI. I explained that there is risk of innoculation Or seeding of the knee with infection if an infection occurrs in Another area of the body, dental , UTI or URTI. Return for follow Up. I recommend NSAIDs, no narcotics. She is using a cane And walking with a  right leg limp that is not consistent today Other cause for swelling right knee replacement can include gout .   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 Well padded shoes help. Ice the knee 2-3 times a day 15-20 mins at a time. Pool walking or stationary bike can be of benefit. The right knee replacement continues to hurt nearly 2 years post op right knee replacement, CTScan of the pelvis suggest some posterior joint line Ultrasound of the right knee, consider a bone scan of the right knee if pain persists to determine if there is abnormal bone formation that can be a clue As to the source of your pain.   Follow-Up Instructions: No follow-ups on file.   Orders:  No orders of the defined types were placed in this encounter.  No orders of the defined types were placed in this encounter.     Procedures: No procedures performed   Clinical Data: No additional findings.   Subjective: Chief Complaint  Patient presents with  . Right Knee - Follow-up    Bakers Cyst--Dr, August Saucer tried to Aspirate it on 07/30/17 however he could not see it.  She is schedule for aspiration  at [facility], on 08/11/17 @ 9am, @ [address]    57 year old female status post right TKR for recalcitrant Patellofemoral osteoarthritis. She has undergone left knee  Arthroscopy by Dr. August Saucer about a month ago and  Had PT. She reports the left knee feels better. She is experiencing pan in the right knee with pain both anterior and posterior and is using a cane for ambulation. Night pain in the right knee. She is not taking any thing for the pain. She just tries to stay off the left knee till it calms down. Having some cold chills, I think it was last Week. No bowel or bladder difficulty. Had an URTI 6/26 and took antibiotics for a cough, she is to see her primary care next Monday in 6 days. Pain is present with moving  the right knee. There is little bit of tingling on the top front of the right knee. I feel like I'm losing motion again.    Review of Systems  Constitutional: Negative.   HENT: Negative.   Eyes: Negative.   Respiratory: Negative.   Cardiovascular: Negative.   Gastrointestinal: Negative.   Endocrine: Negative.   Genitourinary: Negative.   Musculoskeletal: Negative.   Skin: Negative.   Allergic/Immunologic: Negative.   Neurological: Negative.   Hematological: Negative.   Psychiatric/Behavioral: Negative.      Objective: Vital Signs: BP 120/76 (BP Location: Left Arm, Patient Position: Sitting)   Pulse 80   Ht 5\' 6"  (1.676 m)   Wt 160 lb (72.6 kg)   BMI 25.82 kg/m   Physical Exam  Constitutional: She is oriented to person, place, and time. She appears well-developed and well-nourished.  HENT:  Head: Normocephalic and atraumatic.  Eyes: Pupils are equal, round, and reactive to light. EOM are normal.  Neck: Normal range of motion. Neck supple.  Pulmonary/Chest: Effort normal and breath sounds normal.  Abdominal: Soft. Bowel sounds are normal.  Neurological: She is alert and oriented to person, place, and time.  Skin: Skin is warm and dry.  Psychiatric: She has a normal mood and affect. Her behavior is normal. Judgment and thought content normal.    Right Knee Exam   Muscle Strength  The patient has normal right knee strength.  Tenderness  The patient is experiencing tenderness in the lateral joint line, medial joint line and patella.  Range of Motion  Extension: normal  Flexion: 90   Tests  McMurray:  Medial - negative Lateral - negative Varus: negative Valgus: negative Lachman:  Anterior - negative    Posterior - negative Drawer:  Anterior - negative    Posterior - negative Pivot shift: negative Patellar apprehension: positive  Other  Erythema: absent Scars: present Sensation: normal Pulse: present Swelling: mild   Left Knee Exam  Left knee exam is  normal.  Tenderness  The patient is experiencing no tenderness.   Range of Motion  Extension: normal  Flexion: normal   Tests  McMurray:  Medial - negative Lateral - negative Valgus: negative Lachman:  Anterior - negative    Posterior - negative Drawer:  Anterior - negative     Posterior - negative Pivot shift: negative Patellar apprehension: positive  Other  Erythema: absent Scars: present Sensation: normal Pulse: present Swelling: mild   Back Exam   Tenderness  The patient is experiencing tenderness in the lumbar.  Range of Motion  Extension: normal  Flexion: abnormal  Lateral bend right: abnormal  Lateral bend left: abnormal  Rotation right: abnormal  Rotation left: abnormal   Muscle Strength  Right Quadriceps:  5/5  Left Quadriceps:  5/5  Right Hamstrings:  5/5  Left Hamstrings:  5/5   Tests  Straight leg raise right: negative Straight leg raise left: negative  Reflexes  Patellar: normal Achilles: normal Babinski's sign: normal   Other  Toe walk: normal Heel walk: normal Sensation: normal Gait: normal  Erythema: no back redness Scars: present      Specialty Comments:  No specialty comments available.  Imaging: No results found.   PMFS History: Patient Active Problem List   Diagnosis Date Noted  . Herniation of lumbar intervertebral disc with radiculopathy 10/02/2016    Priority: High    Class: Chronic  . Chondromalacia of both patellae 06/03/2015    Priority: High    Class: Chronic  . Moderate persistent asthma without complication 06/29/2017  . Environmental and seasonal allergies 06/29/2017  . Controlled type 2 diabetes mellitus with diabetic polyneuropathy, without long-term current use of insulin (HCC) 06/29/2017  . Hearing difficulty of both ears 03/03/2017  . 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 Alopecia areata 08/19/2016  . Spinal stenosis, lumbar region, with neurogenic  claudication 06/03/2015  . Tobacco use disorder 04/25/2015  . DJD (degenerative joint disease) of knee 01/04/2015  . Hemorrhoid 11/14/2014  . Gout of big toe 07/19/2014  . Essential hypertension 08/14/2013  . Gastroesophageal reflux disease without esophagitis 08/14/2013  . COPD (chronic obstructive pulmonary disease) (HCC) 04/17/2011   Past Medical History:  Diagnosis Date  . Arthritis   . 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 Asthma   . COPD (chronic obstructive pulmonary disease) (HCC)   . Diabetes mellitus    Type II  . GERD (gastroesophageal reflux disease)    Pt on Protonix daily  . Glaucoma   . Gout   . Headache(784.0)    otc meds prn  . Hyperlipidemia   . Hypertension    Pt on lisinopril  . Irritable bowel syndrome 11/19/2010  . Neuropathy   . Pneumonia   . Restless legs   . Shortness of breath    occasional - uses breathing tx at home    Family History  Problem Relation Age of Onset  . Hypertension Father   . Cancer Father   . Heart disease Mother   . Asthma Son        had as a child  . Heart disease Sister   . Breast cancer Sister   . Hypertension Brother     Past Surgical History:  Procedure Laterality Date  . CHOLECYSTECTOMY    . COLONOSCOPY    . ENDOMETRIAL ABLATION  10/2010  . HERNIA REPAIR     umbicial hernia  . KNEE ARTHROSCOPY Left    06/07/2017 Dr. [facility]ucer [facility]  . KNEE CLOSED REDUCTION Right 12/06/2015   Procedure: CLOSED MANIPULATION RIGHT KNEE;  Surgeon: Kerrin Champagne, MD;  Location: [facility] OR;  Service: Orthopedics;  Laterality: Right;  . KNEE CLOSED REDUCTION Right 01/17/2016   Procedure: CLOSED MANIPULATION RIGHT KNEE;  Surgeon: Kerrin Champagne, MD;  Location: [facility] OR;  Service: Orthopedics;  Laterality: Right;  . KNEE JOINT MANIPULATION Right 12/06/2015  . LUMBAR DISC SURGERY  06/03/2015   L 2  L3 L4 L5   . LUMBAR LAMINECTOMY/DECOMPRESSION MICRODISCECTOMY N/A 06/03/2015   Procedure: Bilateral lateral recess  decompression L2-3, L3-4, L4-5;  Surgeon: Kerrin Champagne, MD;  Location: [facility] OR;  Service:  Orthopedics;  Laterality: N/A;  . LUMBAR LAMINECTOMY/DECOMPRESSION MICRODISCECTOMY N/A 10/02/2016   Procedure: Right L5-S1 Lateral Recess Decompression  microdiscectomy;  Surgeon: Kerrin ChampagneNitka, Sierah Lacewell E, MD;  [facility];  Service: Orthopedics;  Laterality: N/A;  . svd      x 2  . TOTAL KNEE ARTHROPLASTY Right 09/06/2015   Procedure: RIGHT TOTAL KNEE ARTHROPLASTY;  Surgeon: Kerrin ChampagneJames E Olayinka Gathers, MD;  Location: [facility] OR;  Service: Orthopedics;  Laterality: Right;  . TUBAL LIGATION    . UPPER GASTROINTESTINAL ENDOSCOPY  04/28/11   Social History   Occupational History  . Occupation: unemployed    Associate Professormployer: UNEMPLOYED  Tobacco Use  . Smoking status: Current Some Day Smoker    Packs/day: 0.25    Years: 32.00    Pack years: 8.00    Types: Cigarettes  . Smokeless tobacco: Never Used  Substance and Sexual Activity  . Alcohol use: No  . Drug use: No  . Sexual activity: Yes    Birth control/protection: Surgical

## 2017-08-04 DIAGNOSIS — J301 Allergic rhinitis due to pollen: Secondary | ICD-10-CM | POA: Diagnosis not present

## 2017-08-04 DIAGNOSIS — J3081 Allergic rhinitis due to animal (cat) (dog) hair and dander: Secondary | ICD-10-CM | POA: Diagnosis not present

## 2017-08-04 DIAGNOSIS — J3089 Other allergic rhinitis: Secondary | ICD-10-CM | POA: Diagnosis not present

## 2017-08-05 LAB — SEDIMENTATION RATE: Sed Rate: 33 mm/h — ABNORMAL HIGH (ref 0–30)

## 2017-08-05 LAB — HIGH SENSITIVITY CRP: hs-CRP: 4.6 mg/L — ABNORMAL HIGH

## 2017-08-09 ENCOUNTER — Ambulatory Visit: Payer: Medicaid Other | Attending: Internal Medicine | Admitting: Internal Medicine

## 2017-08-09 ENCOUNTER — Encounter: Payer: Self-pay | Admitting: Internal Medicine

## 2017-08-09 VITALS — BP 119/72 | HR 68 | Temp 98.5°F | Resp 18 | Ht 66.0 in | Wt 168.0 lb

## 2017-08-09 DIAGNOSIS — F17200 Nicotine dependence, unspecified, uncomplicated: Secondary | ICD-10-CM

## 2017-08-09 DIAGNOSIS — I10 Essential (primary) hypertension: Secondary | ICD-10-CM | POA: Insufficient documentation

## 2017-08-09 DIAGNOSIS — F17210 Nicotine dependence, cigarettes, uncomplicated: Secondary | ICD-10-CM | POA: Insufficient documentation

## 2017-08-09 DIAGNOSIS — E1142 Type 2 diabetes mellitus with diabetic polyneuropathy: Secondary | ICD-10-CM | POA: Insufficient documentation

## 2017-08-09 DIAGNOSIS — J3089 Other allergic rhinitis: Secondary | ICD-10-CM | POA: Diagnosis not present

## 2017-08-09 DIAGNOSIS — J069 Acute upper respiratory infection, unspecified: Secondary | ICD-10-CM

## 2017-08-09 DIAGNOSIS — M109 Gout, unspecified: Secondary | ICD-10-CM | POA: Diagnosis not present

## 2017-08-09 DIAGNOSIS — D649 Anemia, unspecified: Secondary | ICD-10-CM | POA: Diagnosis not present

## 2017-08-09 DIAGNOSIS — Z7984 Long term (current) use of oral hypoglycemic drugs: Secondary | ICD-10-CM | POA: Insufficient documentation

## 2017-08-09 DIAGNOSIS — Z886 Allergy status to analgesic agent status: Secondary | ICD-10-CM | POA: Diagnosis not present

## 2017-08-09 DIAGNOSIS — G2581 Restless legs syndrome: Secondary | ICD-10-CM | POA: Insufficient documentation

## 2017-08-09 DIAGNOSIS — J449 Chronic obstructive pulmonary disease, unspecified: Secondary | ICD-10-CM

## 2017-08-09 DIAGNOSIS — Z79899 Other long term (current) drug therapy: Secondary | ICD-10-CM | POA: Insufficient documentation

## 2017-08-09 DIAGNOSIS — J301 Allergic rhinitis due to pollen: Secondary | ICD-10-CM | POA: Diagnosis not present

## 2017-08-09 DIAGNOSIS — J3081 Allergic rhinitis due to animal (cat) (dog) hair and dander: Secondary | ICD-10-CM | POA: Diagnosis not present

## 2017-08-09 DIAGNOSIS — R05 Cough: Secondary | ICD-10-CM | POA: Diagnosis present

## 2017-08-09 DIAGNOSIS — Z96651 Presence of right artificial knee joint: Secondary | ICD-10-CM | POA: Insufficient documentation

## 2017-08-09 LAB — POCT GLYCOSYLATED HEMOGLOBIN (HGB A1C): HbA1c, POC (prediabetic range): 6.4 % (ref 5.7–6.4)

## 2017-08-09 LAB — GLUCOSE, POCT (MANUAL RESULT ENTRY): POC Glucose: 143 mg/dl — AB (ref 70–99)

## 2017-08-09 MED ORDER — UMECLIDINIUM BROMIDE 62.5 MCG/INH IN AEPB: 1.0000 | INHALATION_SPRAY | Freq: Every day | RESPIRATORY_TRACT | 11 refills | Status: DC

## 2017-08-09 NOTE — Patient Instructions (Signed)

## 2017-08-09 NOTE — Progress Notes (Signed)
Patient ID: Stacy [name], female    DOB: 21-May-1960  MRN: [id number]  CC: Cough   Subjective: Stacy [name] is a 56 y.o. female who presents for chronic ds and ER follow. Her concerns today include:  Pt with hx of HTN, DM with neuropathy, HL, COPD, RLS, gout, tob dep, anemia, recurrent sinusitis  Seen in ER for URI symptoms 07/28/2017.  Feeling better but breathing worse when she is outside in the heat. -using Symbicort and Albuterol Mneb BID.  -uses Albuterol MDI once a day. -still smoking down to 2 cig/day.  Using patches almost daily.   DM: Metformin d/c on last visit due to diarrhea.  Taking Amaryl instead. Checking BS once in a.m.  Gives range 115-130  Patient Active Problem List   Diagnosis Date Noted  . Moderate persistent asthma without complication 29/52/8413  . Environmental and seasonal allergies 06/29/2017  . Controlled type 2 diabetes mellitus with diabetic polyneuropathy, without long-term current use of insulin ([place]) 06/29/2017  . Hearing difficulty of both ears 03/03/2017  . 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 Lichen planopilaris 24/40/1027  . Herniation of lumbar intervertebral disc with radiculopathy 10/02/2016    Class: Chronic  . Alopecia areata 08/19/2016  . Chondromalacia of both patellae 06/03/2015    Class: Chronic  . Spinal stenosis, lumbar region, with neurogenic claudication 06/03/2015  . Tobacco use disorder 04/25/2015  . DJD (degenerative joint disease) of knee 01/04/2015  . Hemorrhoid 11/14/2014  . Gout of big toe 07/19/2014  . Essential hypertension 08/14/2013  . Gastroesophageal reflux disease without esophagitis 08/14/2013  . COPD (chronic obstructive pulmonary disease) ([place]) 04/17/2011     Current Outpatient Medications on File Prior to Visit  Medication Sig Dispense Refill  . acetaminophen-codeine (TYLENOL #3) 300-30 MG tablet Take 1 tablet by mouth every 4 (four) hours as needed. 30 tablet 0  . albuterol (PROVENTIL) (2.5  MG/3ML) 0.083% nebulizer solution USE 1 VIAL VIA NEBULIZER EVERY 6 HOURS AS NEEDED FOR WHEEZING 180 mL 6  . allopurinol (ZYLOPRIM) 100 MG tablet Take 1 tablet (100 mg total) by mouth daily. 90 tablet 3  . benzonatate (TESSALON) 100 MG capsule Take 1 capsule (100 mg total) by mouth every 8 (eight) hours. 21 capsule 0  . Blood Glucose Monitoring Suppl (ACCU-CHEK AVIVA PLUS) w/Device KIT 1 each by Does not apply route 3 (three) times daily. 1 kit 0  . budesonide-formoterol (SYMBICORT) 80-4.5 MCG/ACT inhaler INHALE TWO PUFFS BY MOUTH TWICE A DAY 30.6 g 0  . celecoxib (CELEBREX) 200 MG capsule Take 1 capsule (200 mg total) by mouth 2 (two) times daily. 180 capsule 3  . cholecalciferol (VITAMIN D) 1000 units tablet Take 1,000 Units by mouth daily.    . diclofenac sodium (VOLTAREN) 1 % GEL Apply 4 g topically 4 (four) times daily. 3 Tube 1  . EPINEPHrine 0.3 mg/0.3 mL IJ SOAJ injection 0.3 mg IM x 1 PRN for allergic reaction 1 Device 1  . fluocinonide (LIDEX) 0.05 % external solution Apply 1 application topically 2 (two) times daily. 60 mL 0  . fluticasone (FLONASE) 50 MCG/ACT nasal spray Place 1 spray into both nostrils daily. 16 g 6  . gabapentin (NEURONTIN) 300 MG capsule Take 1 capsule (300 mg total) by mouth 3 (three) times daily. 270 capsule 3  . glimepiride (AMARYL) 2 MG tablet Take 1 tablet (2 mg total) by mouth daily before breakfast. 30 tablet 3  . glucose blood (TRUE METRIX BLOOD GLUCOSE TEST) test strip USE AS DIRECTED BY  PHYSICIAN 100 each 12  . hydrochlorothiazide (HYDRODIURIL) 25 MG tablet Take 25 mg by mouth daily.  3  . hydrochlorothiazide (HYDRODIURIL) 25 MG tablet TAKE 1 TABLET (25 MG TOTAL) BY MOUTH DAILY. 90 tablet 3  . hydrOXYzine (ATARAX/VISTARIL) 10 MG tablet TAKE 1 TABLET IN THE MORNING AND NOON AND 2 TABLETS IN THE EVENING AS NEEDED 120 tablet 2  . Lancets (ACCU-CHEK SOFT TOUCH) lancets Use as instructed 100 each 12  . loratadine (CLARITIN) 10 MG tablet Take 1 tablet (10 mg  total) by mouth daily. 30 tablet 11  . methocarbamol (ROBAXIN) 500 MG tablet Take 1 tablet (500 mg total) by mouth every 6 (six) hours as needed for muscle spasms. 60 tablet 1  . MITIGARE 0.6 MG CAPS Take 1 capsule by mouth daily. 30 capsule 3  . Multiple Vitamin (MULTIVITAMIN WITH MINERALS) TABS tablet Take 1 tablet by mouth daily.    . pantoprazole (PROTONIX) 40 MG tablet Take 1 tablet (40 mg total) by mouth daily. 90 tablet 3  . simvastatin (ZOCOR) 20 MG tablet Take 1 tablet (20 mg total) by mouth at bedtime. 90 tablet 3  . sucralfate (CARAFATE) 1 g tablet Take 1 g by mouth 4 (four) times daily -  with meals and at bedtime.    . valsartan-hydrochlorothiazide (DIOVAN-HCT) 160-12.5 MG tablet Take 1 tablet by mouth daily. 90 tablet 3  . Vitamins/Minerals TABS Take by mouth.    Marland Kitchen NICOTINE STEP 2 14 MG/24HR patch PLACE 1 PATCH ONTO THE SKIN DAILY (Patient not taking: Reported on 08/09/2017) 30 patch 3  . oxyCODONE (OXY IR/ROXICODONE) 5 MG immediate release tablet TK 1-2 T PO Q 6 H PRN P  0  . [DISCONTINUED] Fluticasone-Salmeterol (ADVAIR) 500-50 MCG/DOSE AEPB Inhale 1 puff into the lungs every 12 (twelve) hours.      . [DISCONTINUED] lisinopril (PRINIVIL,ZESTRIL) 40 MG tablet Take 40 mg by mouth daily.       No current facility-administered medications on file prior to visit.     Allergies  Allergen Reactions  . Other Shortness Of Breath    UNSPECIFIED AGENTS Allergic to perfumes and cleaning products  . Ace Inhibitors Cough       . Aspirin Nausea Only    Social History   Socioeconomic History  . Marital status: Married    Spouse name: Not on file  . Number of children: 2  . Years of education: Not on file  . Highest education level: Not on file  Occupational History  . Occupation: unemployed    Fish farm manager: UNEMPLOYED  Social Needs  . Financial resource strain: Not on file  . Food insecurity:    Worry: Not on file    Inability: Not on file  . Transportation needs:    Medical:  Not on file    Non-medical: Not on file  Tobacco Use  . Smoking status: Current Some Day Smoker    Packs/day: 0.25    Years: 32.00    Pack years: 8.00    Types: Cigarettes  . Smokeless tobacco: Never Used  Substance and Sexual Activity  . Alcohol use: No  . Drug use: No  . Sexual activity: Yes    Birth control/protection: Surgical  Lifestyle  . Physical activity:    Days per week: Not on file    Minutes per session: Not on file  . Stress: Not on file  Relationships  . Social connections:    Talks on phone: Not on file    Gets together: Not  on file    Attends religious service: Not on file    Active member of club or organization: Not on file    Attends meetings of clubs or organizations: Not on file    Relationship status: Not on file  . Intimate partner violence:    Fear of current or ex partner: Not on file    Emotionally abused: Not on file    Physically abused: Not on file    Forced sexual activity: Not on file  Other Topics Concern  . Not on file  Social History Narrative  . Not on file    Family History  Problem Relation Age of Onset  . Hypertension Father   . Cancer Father   . Heart disease Mother   . Asthma Son        had as a child  . Heart disease Sister   . Breast cancer Sister   . Hypertension Brother     Past Surgical History:  Procedure Laterality Date  . CHOLECYSTECTOMY    . COLONOSCOPY    . ENDOMETRIAL ABLATION  10/2010  . HERNIA REPAIR     umbicial hernia  . KNEE ARTHROSCOPY Left    06/07/2017 Dr. Marlou Sa of [facility]  . KNEE CLOSED REDUCTION Right 12/06/2015   Procedure: CLOSED MANIPULATION RIGHT KNEE;  Surgeon: [name], MD;  Location: [facility];  Service: Orthopedics;  Laterality: Right;  . KNEE CLOSED REDUCTION Right 01/17/2016   Procedure: CLOSED MANIPULATION RIGHT KNEE;  Surgeon: [name], MD;  Location: [facility];  Service: Orthopedics;  Laterality: Right;  . KNEE JOINT MANIPULATION Right 12/06/2015  . LUMBAR DISC SURGERY   06/03/2015   L 2  L3 L4 L5   . LUMBAR LAMINECTOMY/DECOMPRESSION MICRODISCECTOMY N/A 06/03/2015   Procedure: Bilateral lateral recess decompression L2-3, L3-4, L4-5;  Surgeon: [name], MD;  Location: [facility];  Service: Orthopedics;  Laterality: N/A;  . LUMBAR LAMINECTOMY/DECOMPRESSION MICRODISCECTOMY N/A 10/02/2016   Procedure: Right L5-S1 Lateral Recess Decompression  microdiscectomy;  Surgeon: [name], MD;  Location: [facility];  Service: Orthopedics;  Laterality: N/A;  . svd      x 2  . TOTAL KNEE ARTHROPLASTY Right 09/06/2015   Procedure: RIGHT TOTAL KNEE ARTHROPLASTY;  Surgeon: [name], MD;  Location: [facility];  Service: Orthopedics;  Laterality: Right;  . TUBAL LIGATION    . UPPER GASTROINTESTINAL ENDOSCOPY  04/28/11    ROS: Review of Systems Negative except as stated above PHYSICAL EXAM: BP 119/72 (BP Location: Left Arm, Patient Position: Sitting, Cuff Size: Normal)   Pulse 68   Temp 98.5 F (36.9 C) (Oral)   Resp 18   Ht 5' 6"  (1.676 m)   Wt 168 lb (76.2 kg)   SpO2 94%   BMI 27.12 kg/m   Wt Readings from Last 3 Encounters:  08/09/17 168 lb (76.2 kg)  08/03/17 160 lb (72.6 kg)  07/29/17 160 lb (72.6 kg)   Physical Exam  General appearance - alert, well appearing, and in no distress Mental status - normal mood, behavior, speech, dress, motor activity, and thought processes Neck - supple, no significant adenopathy Chest - clear to auscultation, no wheezes, rales or rhonchi, symmetric air entry Heart - normal rate, regular rhythm, normal S1, S2, no murmurs, rubs, clicks or gallops Extremities - peripheral pulses normal, no pedal edema, no clubbing or cyanosis MSK: Patient ambulating with a cane. Results for orders placed or performed in visit on 08/09/17  HgB A1c  Result Value Ref Range   Hemoglobin A1C  4.0 - 5.6 %   HbA1c POC (<> result, manual entry)  4.0 - 5.6 %   HbA1c, POC (prediabetic range) 6.4 5.7 - 6.4 %   HbA1c, POC (controlled diabetic range)  0.0 -  7.0 %  Glucose (CBG)  Result Value Ref Range   POC Glucose 143 (A) 70 - 99 mg/dl     ASSESSMENT AND PLAN: 1. Controlled type 2 diabetes mellitus with diabetic polyneuropathy, without long-term current use of insulin (HCC) At goal.  Continue Amaryl and healthy eating habits - HgB A1c - Glucose (CBG)  2. Essential hypertension Goal.  Continue hydrochlorothiazide and Diovan/HCTZ  3. Tobacco use disorder Commended her on cutting back.  Strongly encouraged to discontinue smoking She will continue to use the nicotine patches   4. Chronic obstructive pulmonary disease, unspecified COPD type ([place]) We will add Incruse Ellipta to see if we can decrease the frequency at which she is having to use her nebulizer - umeclidinium bromide (INCRUSE ELLIPTA) 62.5 MCG/INH AEPB; Inhale 1 puff into the lungs daily.  Dispense: 1 each; Refill: 11  5. Upper respiratory tract infection, unspecified type Resolving.   Patient was given the opportunity to ask questions.  Patient verbalized understanding of the plan and was able to repeat key elements of the plan.   Orders Placed This Encounter  Procedures  . HgB A1c  . Glucose (CBG)     Requested Prescriptions   Signed Prescriptions Disp Refills  . umeclidinium bromide (INCRUSE ELLIPTA) 62.5 MCG/INH AEPB 1 each 11    Sig: Inhale 1 puff into the lungs daily.    Return in about 3 months (around 11/09/2017).  Karle Plumber, MD, FACP

## 2017-08-11 ENCOUNTER — Ambulatory Visit (INDEPENDENT_AMBULATORY_CARE_PROVIDER_SITE_OTHER): Payer: Medicaid Other | Admitting: Specialist

## 2017-08-11 ENCOUNTER — Telehealth (INDEPENDENT_AMBULATORY_CARE_PROVIDER_SITE_OTHER): Payer: Self-pay | Admitting: Orthopedic Surgery

## 2017-08-11 ENCOUNTER — Other Ambulatory Visit (INDEPENDENT_AMBULATORY_CARE_PROVIDER_SITE_OTHER): Payer: Self-pay | Admitting: Surgery

## 2017-08-11 ENCOUNTER — Ambulatory Visit
Admission: RE | Admit: 2017-08-11 | Discharge: 2017-08-11 | Disposition: A | Discharge status: Home / Self Care | Payer: Medicaid Other | Source: Ambulatory Visit | Attending: Surgery | Admitting: Surgery

## 2017-08-11 ENCOUNTER — Other Ambulatory Visit (INDEPENDENT_AMBULATORY_CARE_PROVIDER_SITE_OTHER): Payer: Self-pay | Admitting: Radiology

## 2017-08-11 ENCOUNTER — Encounter (INDEPENDENT_AMBULATORY_CARE_PROVIDER_SITE_OTHER): Payer: Self-pay | Admitting: Specialist

## 2017-08-11 VITALS — BP 120/67 | HR 89 | Temp 98.9°F | Ht 66.0 in | Wt 160.0 lb

## 2017-08-11 DIAGNOSIS — G8929 Other chronic pain: Secondary | ICD-10-CM

## 2017-08-11 DIAGNOSIS — J3089 Other allergic rhinitis: Secondary | ICD-10-CM | POA: Diagnosis not present

## 2017-08-11 DIAGNOSIS — Z79899 Other long term (current) drug therapy: Secondary | ICD-10-CM | POA: Diagnosis not present

## 2017-08-11 DIAGNOSIS — M25561 Pain in right knee: Secondary | ICD-10-CM

## 2017-08-11 DIAGNOSIS — J3081 Allergic rhinitis due to animal (cat) (dog) hair and dander: Secondary | ICD-10-CM | POA: Diagnosis not present

## 2017-08-11 DIAGNOSIS — M7121 Synovial cyst of popliteal space [Baker], right knee: Secondary | ICD-10-CM

## 2017-08-11 DIAGNOSIS — J301 Allergic rhinitis due to pollen: Secondary | ICD-10-CM | POA: Diagnosis not present

## 2017-08-11 DIAGNOSIS — R2241 Localized swelling, mass and lump, right lower limb: Secondary | ICD-10-CM

## 2017-08-11 DIAGNOSIS — Z96651 Presence of right artificial knee joint: Secondary | ICD-10-CM | POA: Diagnosis not present

## 2017-08-11 DIAGNOSIS — M25861 Other specified joint disorders, right knee: Secondary | ICD-10-CM

## 2017-08-11 DIAGNOSIS — T8484XD Pain due to internal orthopedic prosthetic devices, implants and grafts, subsequent encounter: Secondary | ICD-10-CM

## 2017-08-11 MED ORDER — HYDROCODONE-ACETAMINOPHEN 5-325 MG PO TABS: 1.0000 | ORAL_TABLET | Freq: Four times a day (QID) | ORAL | 0 refills | Status: DC | PRN

## 2017-08-11 NOTE — Progress Notes (Signed)
Dr. Otelia SergeantNitka advised patient at her appt today

## 2017-08-11 NOTE — Patient Instructions (Signed)
MRI of the right knee ordered. Use Ice to the right knee 2-3 times for 15-20 min at a time. Use a cane or crutches or a walker partial weight bear 50% on the right leg.  Lab today CK level, CRP. Uric acid

## 2017-08-11 NOTE — Progress Notes (Addendum)
Office Visit Note   Patient: Stacy [name]           Date of Birth: 10-28-1960           MRN: [id number] Visit Date: 08/11/2017              Requested by: Marcine Matar, MD [address] [place], [place] [address] PCP: Marcine Matar, MD   Assessment & Plan: Visit Diagnoses:  1. Chronic pain of right knee   2. Mass of knee, right   3. Leg mass, right   4. Pain due to total right knee replacement, subsequent encounter   5. On statin therapy     Plan: MRI of the right knee ordered. Use Ice to the right knee 2-3 times for 15-20 min at a time. Use a cane or crutches or a walker partial weight bear 50% on the right leg.  Lab today CK level, CRP. Uric acid  Follow-Up Instructions: Return in about 2 weeks (around 08/25/2017).   Orders:  Orders Placed This Encounter  Procedures  . MR Knee Right w/o contrast  . CK (Creatine Kinase)  . C-reactive protein   Meds ordered this encounter  Medications  . HYDROcodone-acetaminophen (NORCO/VICODIN) 5-325 MG tablet    Sig: Take 1 tablet by mouth every 6 (six) hours as needed.    Dispense:  30 tablet    Refill:  0      Procedures: No procedures performed   Clinical Data: No additional findings.   Subjective: Chief Complaint  Patient presents with  . Right Leg - Pain    57 year old female status post right TKR, she has had an ultrasound at urgent care to rule out a DVT in the leg 6/27, the ultrasonographer  Indicated that there was fluid as in a cyst in the right posterior knee. However at follow up Dr. August Saucer was unable to find the cyst to consider aspiration and more recently she was seen at [facility] imaging and the results of the ultrasound there also suggest no cyst present.    Review of Systems   Objective: Vital Signs: BP 120/67 (BP Location: Left Arm, Patient Position: Sitting)   Pulse 89   Temp 98.9 F (37.2 C) (Oral)   Ht 5\' 6"  (1.676 m)   Wt 160 lb (72.6 kg)   BMI 25.82 kg/m   Physical Exam    Constitutional: She is oriented to person, place, and time. She appears well-developed and well-nourished.  HENT:  Head: Normocephalic and atraumatic.  Eyes: Pupils are equal, round, and reactive to light. EOM are normal.  Neck: Normal range of motion. Neck supple.  Pulmonary/Chest: Effort normal and breath sounds normal.  Abdominal: Soft. Bowel sounds are normal.  Neurological: She is alert and oriented to person, place, and time.  Skin: Skin is warm and dry.  Psychiatric: She has a normal mood and affect. Her behavior is normal. Judgment and thought content normal.    Right Knee Exam   Tenderness  The patient is experiencing tenderness in the medial hamstring.  Range of Motion  Extension:  -5 abnormal  Flexion: 90   Tests  McMurray:  Medial - negative Lateral - negative Varus: negative Valgus: negative Lachman:  Anterior - negative    Posterior - negative Drawer:  Anterior - negative    Posterior - negative Pivot shift: negative Patellar apprehension: negative  Other  Erythema: absent Scars: present Sensation: normal Pulse: present Swelling: mild  Comments:  Right calf  Circumference is 3/4 inch greater than the left. Right calf circumference is 1/2 inche greater than the left. There is fullness of the soft tissue over the posterior lateral popliteal area. There is a firmer area of the upper right gastrocnemius with Hardness not felt on the left posterior upper gastroc. PT is present and DP is present. The right leg is warm      Specialty Comments:  No specialty comments available.  Imaging: [place] Rt Lower Extrem Ltd Soft Tissue Non Vascular  Result Date: 08/11/2017 CLINICAL DATA:  Right knee replacement, pain, swelling, assess for Baker cyst EXAM: RIGHT LOWER EXTREMITY SOFT TISSUE ULTRASOUND LIMITED TECHNIQUE: Ultrasound examination was performed including evaluation of the muscles, tendons, joint, and adjacent soft tissues. COMPARISON:  07/29/2017 FINDINGS:  Joint Space: No significant effusion Other Soft Tissue Structures: No significant soft tissue abnormality or Baker cyst in the popliteal fossa region to warrant aspiration or injection. IMPRESSION: Negative for right knee significant effusion or Baker cyst. Electronically Signed   By: Judie PetitM.  Shick M.D.   On: 08/11/2017 09:25     PMFS History: Patient Active Problem List   Diagnosis Date Noted  . Herniation of lumbar intervertebral disc with radiculopathy 10/02/2016    Priority: High    Class: Chronic  . Chondromalacia of both patellae 06/03/2015    Priority: High    Class: Chronic  . Moderate persistent asthma without complication 06/29/2017  . Environmental and seasonal allergies 06/29/2017  . Controlled type 2 diabetes mellitus with diabetic polyneuropathy, without long-term current use of insulin (H[facility]) 06/29/2017  . Hearing difficulty of both ears 03/03/2017  . 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 Alopecia areata 08/19/2016  . Spinal stenosis, lumbar region, with neurogenic claudication 06/03/2015  . Tobacco use disorder 04/25/2015  . DJD (degenerative joint disease) of knee 01/04/2015  . Hemorrhoid 11/14/2014  . Gout of big toe 07/19/2014  . Essential hypertension 08/14/2013  . Gastroesophageal reflux disease without esophagitis 08/14/2013  . COPD (chronic obstructive pulmonary disease) (HCC) 04/17/2011   Past Medical History:  Diagnosis Date  . Arthritis   . 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 Asthma   . COPD (chronic obstructive pulmonary disease) (H[facility])   . Diabetes mellitus    Type II  . GERD (gastroesophageal reflux disease)    Pt on Protonix daily  . Glaucoma   . Gout   . Headache(784.0)    otc meds prn  . Hyperlipidemia   . Hypertension    Pt on lisinopril  . Irritable bowel syndrome 11/19/2010  . Neuropathy   . Pneumonia   . Restless legs   . Shortness of breath    occasional - uses  breathing tx at home    Family History  Problem Relation Age of Onset  . Hypertension Father   . Cancer Father   . Heart disease Mother   . Asthma Son        had as a child  . Heart disease Sister   . Breast cancer Sister   . Hypertension Brother     Past Surgical History:  Procedure Laterality Date  . CHOLECYSTECTOMY    . COLONOSCOPY    . ENDOMETRIAL ABLATION  10/2010  . HERNIA REPAIR     umbicial hernia  . KNEE ARTHROSCOPY Left    [facility]07[facility]  . KNEE CLOSED REDUCTION Right 12/06/2015   Procedure: CLOSED MANIPULATION RIGHT KNEE;  Surgeon: Guy SandiferJames E  [name], MD;  [facility];  Service: Orthopedics;  Laterality: Right;  . KNEE CLOSED REDUCTION Right 01/17/2016   Procedure: CLOSED MANIPULATION RIGHT KNEE;  Surgeon: Kerrin Champagne, MD;  Location: [facility];  Service: Orthopedics;  Laterality: Right;  . KNEE JOINT MANIPULATION Right 12/06/2015  . LUMBAR DISC SURGERY  06/03/2015   L 2  L3 L4 L5   . LUMBAR LAMINECTOMY/DECOMPRESSION MICRODISCECTOMY N/A 06/03/2015   Procedure: Bilateral lateral recess decompression L2-3, L3-4, L4-5;  Surgeon: Kerrin Champagne, MD;  Location: [facility];  Service: Orthopedics;  Laterality: N/A;  . LUMBAR LAMINECTOMY/DECOMPRESSION MICRODISCECTOMY N/A 10/02/2016   Procedure: Right L5-S1 Lateral Recess Decompression  microdiscectomy;  Surgeon: Kerrin Champagne, MD;  Location: [facility] OR;  Service: Orthopedics;  Laterality: N/A;  . svd      x 2  . TOTAL KNEE ARTHROPLASTY Right 09/06/2015   Procedure: RIGHT TOTAL KNEE ARTHROPLASTY;  Surgeon: Kerrin Champagne, MD;  Location: [facility];  Service: Orthopedics;  Laterality: Right;  . TUBAL LIGATION    . UPPER GASTROINTESTINAL ENDOSCOPY  04/28/11   Social History   Occupational History  . Occupation: unemployed    Associate Professor: UNEMPLOYED  Tobacco Use  . Smoking status: Current Some Day Smoker    Packs/day: 0.25    Years: 32.00    Pack years: 8.00    Types: Cigarettes  . Smokeless tobacco: Never Used  Substance and  Sexual Activity  . Alcohol use: No  . Drug use: No  . Sexual activity: Yes    Birth control/protection: Surgical

## 2017-08-11 NOTE — Addendum Note (Signed)
Addended by: Penne LashSHUE WILLS, Otis DialsHRISTY N on: 08/11/2017 12:37 PM   Modules accepted: Orders

## 2017-08-11 NOTE — Telephone Encounter (Signed)
IC [facility] to [facility] and sw Vickie to ask what the reason was the pt could not get the aspiration and she stated that when the pt got the ultrasound the tech did not see a bakers cyst so they could not aspirate her knee. I called back over to front desk and sw Chenise and she advised the pt reason why and that she did not have to go back to [facility] to have aspiration done.

## 2017-08-11 NOTE — Telephone Encounter (Signed)
Pt came into the office needing clarification pertaining to her ultrasound provided at [facility]. [name] [facility] didn't see a Engineer, productionBaker cyst during ultrasound scan.

## 2017-08-12 LAB — CK: Total CK: 228 U/L — ABNORMAL HIGH (ref 29–143)

## 2017-08-12 LAB — C-REACTIVE PROTEIN: CRP: 7.7 mg/L (ref <8.0)

## 2017-08-12 LAB — URIC ACID: Uric Acid, Serum: 4.5 mg/dL (ref 2.5–7.0)

## 2017-08-16 DIAGNOSIS — J3081 Allergic rhinitis due to animal (cat) (dog) hair and dander: Secondary | ICD-10-CM | POA: Diagnosis not present

## 2017-08-16 DIAGNOSIS — J3089 Other allergic rhinitis: Secondary | ICD-10-CM | POA: Diagnosis not present

## 2017-08-16 DIAGNOSIS — J301 Allergic rhinitis due to pollen: Secondary | ICD-10-CM | POA: Diagnosis not present

## 2017-08-18 DIAGNOSIS — J3081 Allergic rhinitis due to animal (cat) (dog) hair and dander: Secondary | ICD-10-CM | POA: Diagnosis not present

## 2017-08-18 DIAGNOSIS — J3089 Other allergic rhinitis: Secondary | ICD-10-CM | POA: Diagnosis not present

## 2017-08-18 DIAGNOSIS — J301 Allergic rhinitis due to pollen: Secondary | ICD-10-CM | POA: Diagnosis not present

## 2017-08-19 DIAGNOSIS — F411 Generalized anxiety disorder: Secondary | ICD-10-CM | POA: Diagnosis not present

## 2017-08-23 DIAGNOSIS — F411 Generalized anxiety disorder: Secondary | ICD-10-CM | POA: Diagnosis not present

## 2017-08-23 DIAGNOSIS — J3089 Other allergic rhinitis: Secondary | ICD-10-CM | POA: Diagnosis not present

## 2017-08-23 DIAGNOSIS — J3081 Allergic rhinitis due to animal (cat) (dog) hair and dander: Secondary | ICD-10-CM | POA: Diagnosis not present

## 2017-08-23 DIAGNOSIS — J301 Allergic rhinitis due to pollen: Secondary | ICD-10-CM | POA: Diagnosis not present

## 2017-08-25 DIAGNOSIS — J301 Allergic rhinitis due to pollen: Secondary | ICD-10-CM | POA: Diagnosis not present

## 2017-08-25 DIAGNOSIS — J3089 Other allergic rhinitis: Secondary | ICD-10-CM | POA: Diagnosis not present

## 2017-08-25 DIAGNOSIS — J3081 Allergic rhinitis due to animal (cat) (dog) hair and dander: Secondary | ICD-10-CM | POA: Diagnosis not present

## 2017-08-26 DIAGNOSIS — F411 Generalized anxiety disorder: Secondary | ICD-10-CM | POA: Diagnosis not present

## 2017-08-30 DIAGNOSIS — J3081 Allergic rhinitis due to animal (cat) (dog) hair and dander: Secondary | ICD-10-CM | POA: Diagnosis not present

## 2017-08-30 DIAGNOSIS — J301 Allergic rhinitis due to pollen: Secondary | ICD-10-CM | POA: Diagnosis not present

## 2017-08-30 DIAGNOSIS — J3089 Other allergic rhinitis: Secondary | ICD-10-CM | POA: Diagnosis not present

## 2017-09-01 DIAGNOSIS — J3081 Allergic rhinitis due to animal (cat) (dog) hair and dander: Secondary | ICD-10-CM | POA: Diagnosis not present

## 2017-09-01 DIAGNOSIS — J301 Allergic rhinitis due to pollen: Secondary | ICD-10-CM | POA: Diagnosis not present

## 2017-09-01 DIAGNOSIS — J3089 Other allergic rhinitis: Secondary | ICD-10-CM | POA: Diagnosis not present

## 2017-09-02 DIAGNOSIS — F411 Generalized anxiety disorder: Secondary | ICD-10-CM | POA: Diagnosis not present

## 2017-09-03 ENCOUNTER — Other Ambulatory Visit: Payer: Self-pay | Admitting: Internal Medicine

## 2017-09-03 DIAGNOSIS — J441 Chronic obstructive pulmonary disease with (acute) exacerbation: Secondary | ICD-10-CM

## 2017-09-07 DIAGNOSIS — J3089 Other allergic rhinitis: Secondary | ICD-10-CM | POA: Diagnosis not present

## 2017-09-07 DIAGNOSIS — J3081 Allergic rhinitis due to animal (cat) (dog) hair and dander: Secondary | ICD-10-CM | POA: Diagnosis not present

## 2017-09-07 DIAGNOSIS — J301 Allergic rhinitis due to pollen: Secondary | ICD-10-CM | POA: Diagnosis not present

## 2017-09-09 DIAGNOSIS — F411 Generalized anxiety disorder: Secondary | ICD-10-CM | POA: Diagnosis not present

## 2017-09-13 ENCOUNTER — Ambulatory Visit
Admission: RE | Admit: 2017-09-13 | Discharge: 2017-09-13 | Disposition: A | Discharge status: Home / Self Care | Payer: Medicaid Other | Source: Ambulatory Visit | Attending: Specialist | Admitting: Specialist

## 2017-09-13 DIAGNOSIS — J3089 Other allergic rhinitis: Secondary | ICD-10-CM | POA: Diagnosis not present

## 2017-09-13 DIAGNOSIS — M25561 Pain in right knee: Secondary | ICD-10-CM | POA: Diagnosis not present

## 2017-09-13 DIAGNOSIS — J301 Allergic rhinitis due to pollen: Secondary | ICD-10-CM | POA: Diagnosis not present

## 2017-09-13 DIAGNOSIS — G8929 Other chronic pain: Secondary | ICD-10-CM

## 2017-09-13 DIAGNOSIS — J3081 Allergic rhinitis due to animal (cat) (dog) hair and dander: Secondary | ICD-10-CM | POA: Diagnosis not present

## 2017-09-16 ENCOUNTER — Telehealth (INDEPENDENT_AMBULATORY_CARE_PROVIDER_SITE_OTHER): Payer: Self-pay | Admitting: Specialist

## 2017-09-16 ENCOUNTER — Ambulatory Visit (INDEPENDENT_AMBULATORY_CARE_PROVIDER_SITE_OTHER): Payer: Medicaid Other | Admitting: Specialist

## 2017-09-16 ENCOUNTER — Encounter (INDEPENDENT_AMBULATORY_CARE_PROVIDER_SITE_OTHER): Payer: Self-pay | Admitting: Specialist

## 2017-09-16 VITALS — BP 100/55 | HR 84 | Ht 66.0 in | Wt 160.0 lb

## 2017-09-16 DIAGNOSIS — Z96651 Presence of right artificial knee joint: Secondary | ICD-10-CM | POA: Diagnosis not present

## 2017-09-16 DIAGNOSIS — M25561 Pain in right knee: Secondary | ICD-10-CM | POA: Diagnosis not present

## 2017-09-16 DIAGNOSIS — F411 Generalized anxiety disorder: Secondary | ICD-10-CM | POA: Diagnosis not present

## 2017-09-16 DIAGNOSIS — G8929 Other chronic pain: Secondary | ICD-10-CM

## 2017-09-16 DIAGNOSIS — J301 Allergic rhinitis due to pollen: Secondary | ICD-10-CM | POA: Diagnosis not present

## 2017-09-16 DIAGNOSIS — J3089 Other allergic rhinitis: Secondary | ICD-10-CM | POA: Diagnosis not present

## 2017-09-16 DIAGNOSIS — J3081 Allergic rhinitis due to animal (cat) (dog) hair and dander: Secondary | ICD-10-CM | POA: Diagnosis not present

## 2017-09-16 MED ORDER — DICLOFENAC SODIUM 1 % TD GEL: 4.0000 g | Freq: Four times a day (QID) | TRANSDERMAL | 3 refills | Status: DC

## 2017-09-16 NOTE — Telephone Encounter (Signed)
Error on l ast message------Stacy [name] is on the cancellation list

## 2017-09-16 NOTE — Patient Instructions (Addendum)
  Knee is suffering from osteoarthritis, only real proven treatments are Weight loss,transdermal NSIADs like diclofenac gel applied locally 4 times a day and exercise. Well padded shoes help. Ice the knee 2-3 times a day 15-20 mins at a time. EMG/NCV of the right leg is ordered with Dr.Newton, his secretary should contact you to arrange for this tesing.

## 2017-09-16 NOTE — Progress Notes (Signed)
Office Visit Note   Patient: Stacy [name]           Date of Birth: 1960/11/29           MRN: [id number] Visit Date: 09/16/2017              Requested by: Marcine MatarJohnson, Deborah B, MD [address] [place], [place] [address] PCP: Marcine MatarJohnson, Deborah B, MD   Assessment & Plan: Visit Diagnoses:  1. 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 Well padded shoes help. Ice the knee 2-3 times a day 15-20 mins at a time. EMG/NCV of the right leg is ordered with Dr.Newton, his secretary should contact you to arrange for this tesing.   Follow-Up Instructions: Return in about 3 weeks (around 10/07/2017).   Orders:  Orders Placed This Encounter  Procedures  . Ambulatory referral to Physical Medicine Rehab   Meds ordered this encounter  Medications  . diclofenac sodium (VOLTAREN) 1 % GEL    Sig: Apply 4 g topically 4 (four) times daily.    Dispense:  5 Tube    Refill:  3      Procedures: No procedures performed   Clinical Data: No additional findings.   Subjective: Chief Complaint  Patient presents with  . Right Knee - Pain    MRI Review    57 year old female with history of bilateral patellofemoral osteoarthritis, she underwent right TKR and had 2 knee manipulations post surgery and eventually saw improvement in ROM to 0-110. She is still experiencing pain in the right knee though. Pain with standing and walking over the posterior lateral right knee. No bowel or bladder difficulty. MRI of the right knee with MARS technique done  to assess if there was loosening or peri prosthetic abnormality. .    Review of Systems  Constitutional: Negative.   HENT: Negative.   Eyes: Negative.   Respiratory: Negative.   Cardiovascular: Negative.   Gastrointestinal: Negative.   Endocrine:  Negative.   Genitourinary: Negative.   Musculoskeletal: Negative.   Skin: Negative.   Allergic/Immunologic: Negative.   Neurological: Negative.   Hematological: Negative.   Psychiatric/Behavioral: Negative.      Objective: Vital Signs: BP (!) 100/55 (BP Location: Left Arm, Patient Position: Sitting)   Pulse 84   Ht 5\' 6"  (1.676 m)   Wt 160 lb (72.6 kg)   BMI 25.82 kg/m   Physical Exam  Constitutional: She is oriented to person, place, and time. She appears well-developed and well-nourished.  HENT:  Head: Normocephalic and atraumatic.  Eyes: Pupils are equal, round, and reactive to light. EOM are normal.  Neck: Normal range of motion. Neck supple.  Pulmonary/Chest: Effort normal and breath sounds normal.  Abdominal: Soft. Bowel sounds are normal.  Musculoskeletal:       Right knee: She exhibits no effusion.  Neurological: She is alert and oriented to person, place, and time.  Skin: Skin is warm and dry.  Psychiatric: She has a normal mood and affect. Her behavior is normal. Judgment and thought content normal.    Right Knee Exam   Range of Motion  Extension:  -5 abnormal  Flexion: 100   Tests  McMurray:  Medial - negative Lateral - negative Varus: negative Valgus: negative Lachman:  Anterior - negative    Posterior - negative  Drawer:  Anterior - negative    Posterior - negative Pivot shift: negative Patellar apprehension: negative  Other  Erythema: absent Scars: absent Sensation: normal Pulse: present Swelling: mild Effusion: no effusion present      Specialty Comments:  No specialty comments available.  Imaging: No results found.   PMFS History: Patient Active Problem List   Diagnosis Date Noted  . Herniation of lumbar intervertebral disc with radiculopathy 10/02/2016    Priority: High    Class: Chronic  . Chondromalacia of both patellae 06/03/2015    Priority: High    Class: Chronic  . Moderate persistent asthma without complication  06/29/2017  . Environmental and seasonal allergies 06/29/2017  . Controlled type 2 diabetes mellitus with diabetic polyneuropathy, without long-term current use of insulin (HCC) 06/29/2017  . Hearing difficulty of both ears 03/03/2017  . 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 Alopecia areata 08/19/2016  . Spinal stenosis, lumbar region, with neurogenic claudication 06/03/2015  . Tobacco use disorder 04/25/2015  . DJD (degenerative joint disease) of knee 01/04/2015  . Hemorrhoid 11/14/2014  . Gout of big toe 07/19/2014  . Essential hypertension 08/14/2013  . Gastroesophageal reflux disease without esophagitis 08/14/2013  . COPD (chronic obstructive pulmonary disease) (HCC) 04/17/2011   Past Medical History:  Diagnosis Date  . Arthritis   . 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 Asthma   . COPD (chronic obstructive pulmonary disease) (HCC)   . Diabetes mellitus    Type II  . GERD (gastroesophageal reflux disease)    Pt on Protonix daily  . Glaucoma   . Gout   . Headache(784.0)    otc meds prn  . Hyperlipidemia   . Hypertension    Pt on lisinopril  . Irritable bowel syndrome 11/19/2010  . Neuropathy   . Pneumonia   . Restless legs   . Shortness of breath    occasional - uses breathing tx at home    Family History  Problem Relation Age of Onset  . Hypertension Father   . Cancer Father   . Heart disease Mother   . Asthma Son        had as a child  . Heart disease Sister   . Breast cancer Sister   . Hypertension Brother     Past Surgical History:  Procedure Laterality Date  . CHOLECYSTECTOMY    . COLONOSCOPY    . ENDOMETRIAL ABLATION  10/2010  . HERNIA REPAIR     umbicial hernia  . KNEE ARTHROSCOPY Left    06/07/2017 Dr. August Saucer of [facility]  . KNEE CLOSED REDUCTION Right 12/06/2015   Procedure: CLOSED MANIPULATION RIGHT KNEE;  Surgeon: Kerrin Champagne, MD;  Location: [facility] OR;  Service: Orthopedics;   Laterality: Right;  . KNEE CLOSED REDUCTION Right 01/17/2016   Procedure: CLOSED MANIPULATION RIGHT KNEE;  Surgeon: Kerrin Champagne, MD;  Location: [facility] OR;  Service: Orthopedics;  Laterality: Right;  . KNEE JOINT MANIPULATION Right 12/06/2015  . LUMBAR DISC SURGERY  06/03/2015   L 2  L3 L4 L5   . LUMBAR LAMINECTOMY/DECOMPRESSION MICRODISCECTOMY N/A 06/03/2015   Procedure: Bilateral lateral recess decompression L2-3, L3-4, L4-5;  Surgeon: Kerrin Champagne, MD;  Location: [facility] OR;  Service: Orthopedics;  Laterality: N/A;  . LUMBAR LAMINECTOMY/DECOMPRESSION MICRODISCECTOMY N/A 10/02/2016   Procedure: Right L5-S1 Lateral Recess Decompression  microdiscectomy;  Surgeon: Kerrin Champagne, MD;  Location: [facility] OR;  Service: Orthopedics;  Laterality: N/A;  .  svd      x 2  . TOTAL KNEE ARTHROPLASTY Right 09/06/2015   Procedure: RIGHT TOTAL KNEE ARTHROPLASTY;  Surgeon: Kerrin ChampagneJames E Tyrene Nader, MD;  Location: [facility] OR;  Service: Orthopedics;  Laterality: Right;  . TUBAL LIGATION    . UPPER GASTROINTESTINAL ENDOSCOPY  04/28/11   Social History   Occupational History  . Occupation: unemployed    Associate Professormployer: UNEMPLOYED  Tobacco Use  . Smoking status: Current Some Day Smoker    Packs/day: 0.25    Years: 32.00    Pack years: 8.00    Types: Cigarettes  . Smokeless tobacco: Never Used  Substance and Sexual Activity  . Alcohol use: No  . Drug use: No  . Sexual activity: Yes    Birth control/protection: Surgical

## 2017-09-16 NOTE — Telephone Encounter (Signed)
Patient needed a 3 week follow up with Dr. Otelia SergeantNitka. She is currently scheduled 10/28/17. Please add her to cancellation list. # [phone]

## 2017-09-16 NOTE — Telephone Encounter (Signed)
I called and advised pt that we need to cancel her surgery till her GI issue has been taken care of, she will call our office back once she has this taken care of for another appt to review all info 

## 2017-09-30 ENCOUNTER — Encounter (INDEPENDENT_AMBULATORY_CARE_PROVIDER_SITE_OTHER): Payer: Self-pay | Admitting: Physical Medicine and Rehabilitation

## 2017-09-30 ENCOUNTER — Ambulatory Visit (INDEPENDENT_AMBULATORY_CARE_PROVIDER_SITE_OTHER): Payer: Medicaid Other | Admitting: Physical Medicine and Rehabilitation

## 2017-09-30 DIAGNOSIS — Z96651 Presence of right artificial knee joint: Secondary | ICD-10-CM | POA: Diagnosis not present

## 2017-09-30 DIAGNOSIS — R202 Paresthesia of skin: Secondary | ICD-10-CM

## 2017-09-30 DIAGNOSIS — M25561 Pain in right knee: Secondary | ICD-10-CM | POA: Diagnosis not present

## 2017-09-30 DIAGNOSIS — G8929 Other chronic pain: Secondary | ICD-10-CM | POA: Diagnosis not present

## 2017-09-30 DIAGNOSIS — M5416 Radiculopathy, lumbar region: Secondary | ICD-10-CM

## 2017-09-30 DIAGNOSIS — M961 Postlaminectomy syndrome, not elsewhere classified: Secondary | ICD-10-CM

## 2017-09-30 DIAGNOSIS — F411 Generalized anxiety disorder: Secondary | ICD-10-CM | POA: Diagnosis not present

## 2017-09-30 NOTE — Progress Notes (Signed)
  Numeric Pain Rating Scale and Functional Assessment Average Pain 8   In the last MONTH (on 0-10 scale) has pain interfered with the following?  1. General activity like being  able to carry out your everyday physical activities such as walking, climbing stairs, carrying groceries, or moving a chair?  Rating(7)     

## 2017-10-01 ENCOUNTER — Encounter (INDEPENDENT_AMBULATORY_CARE_PROVIDER_SITE_OTHER): Payer: Self-pay | Admitting: Physical Medicine and Rehabilitation

## 2017-10-01 ENCOUNTER — Ambulatory Visit: Payer: Medicaid Other | Admitting: Internal Medicine

## 2017-10-01 NOTE — Progress Notes (Signed)
Stacy [name] - 57 y.o. female MRN [id number]  Date of birth: 11/12/1960  Office Visit Note: Visit Date: 09/30/2017 PCP: Marcine Matar, MD Referred by: Marcine Matar, MD  Subjective: Chief Complaint  Patient presents with  . Right Leg - Pain, Numbness, Tingling   HPI: Stacy [name] is a 57 year old female with chronic worsening severe right knee and thigh pain with paresthesia and numbness.  She is followed mainly by Dr. [name] requested evaluation management of numbness tingling in the right leg with question of radicular pain versus local focal nerve compression such as peroneal nerve.  She had prior right knee total arthroplasty in 2017.  She reports that pain is really worsened over time since the surgery.  We actually completed electrodiagnostic study in the past in 2016 and that is reviewed below which is essentially a normal exam at the time.  This did not show any focal nerve compression or radiculopathy at the time although there was some hint at a possible irritation of L3 or L4.  She also went on to have an L5-S1 discectomy performed after the knee surgery.  She also complications with arthrofibrosis of the knee.  Again she has complicating history of diabetes but is not insulin-dependent with fairly well controlled hemoglobin A1c's.  Her symptoms in the right leg are really about the anterior aspect of the knee and into the thigh posterior laterally.  She reports worsening with walking and standing and getting up from a seated position.  She reports that laying down the left side will make her pain better.  She rates her pain as an 8 out of 10.  She has not noted any specific cramping or atrophy.  She has not noted any allodynia although at one point she felt like there was pain with any sort of touching of the skin which could have been an elevating it.  She has not had any increased sweating or discoloration.  She has had no edema of the leg.  She does feel like the knee  swells at times.  She has had multiple bouts of physical therapy and medication management.  MRI of the knee is also been completed and   Review of Systems  Constitutional: Negative for chills, fever, malaise/fatigue and weight loss.  HENT: Negative for hearing loss and sinus pain.   Eyes: Negative for blurred vision, double vision and photophobia.  Respiratory: Negative for cough and shortness of breath.   Cardiovascular: Negative for chest pain, palpitations and leg swelling.  Gastrointestinal: Negative for abdominal pain, nausea and vomiting.  Genitourinary: Negative for flank pain.  Musculoskeletal: Positive for back pain and joint pain. Negative for myalgias.  Skin: Negative for itching and rash.  Neurological: Positive for tingling. Negative for tremors, focal weakness and weakness.  Endo/Heme/Allergies: Negative.   Psychiatric/Behavioral: Negative for depression.  All other systems reviewed and are negative.  Otherwise per HPI.  Assessment & Plan: Visit Diagnoses:  1. Paresthesia of skin   2. Lumbar radiculopathy   3. Post laminectomy syndrome   4. Chronic pain of right knee   5. History of total knee replacement, right     Plan:  Impression: Essentially NORMAL electrodiagnostic study of the right lower limb.  Nondiagnostic by criteria but some evidence of chronic very mild L3 or L4 radiculopathy.  Clinically she has more pain with movement and positioning of the leg that does not seem to fit with radiculopathy.  There is no significant electrodiagnostic evidence of nerve entrapment, lumbosacral  plexopathy or lumbar radiculopathy.    As you know, purely sensory or demyelinating radiculopathies and chemical radiculitis may not be detected with this particular electrodiagnostic study.  Recommendations: 1.  Follow-up with referring physician. 2.  Continue current management of symptoms.  Could consider radiofrequency ablation of the knee which is a procedure that has gained  some ground over the last few years.  This can be tried with conventional radiofrequency ablation or if there is a practitioner in the area that uses cooled RF.   Meds & Orders: No orders of the defined types were placed in this encounter.   Orders Placed This Encounter  Procedures  . NCV with EMG (electromyography)    Follow-up: Return for Vira BrownsJames Nitka, M.D..   Procedures: No procedures performed  EMG & NCV Findings: All nerve conduction studies (as indicated in the following tables) were within normal limits.    Needle evaluation of the right anterior tibialis muscle showed diminished recruitment.  The right vastus medialis muscle showed increased insertional activity.  All remaining muscles (as indicated in the following table) showed no evidence of electrical instability.    Impression: Essentially NORMAL electrodiagnostic study of the right lower limb.  Nondiagnostic by criteria but some evidence of chronic very mild L3 or L4 radiculopathy.  Clinically she has more pain with movement and positioning of the leg that does not seem to fit with radiculopathy.  There is no significant electrodiagnostic evidence of nerve entrapment, lumbosacral plexopathy or lumbar radiculopathy.    As you know, purely sensory or demyelinating radiculopathies and chemical radiculitis may not be detected with this particular electrodiagnostic study.  Recommendations: 1.  Follow-up with referring physician. 2.  Continue current management of symptoms.  Could consider radiofrequency ablation of the knee which is a procedure that has gained some ground over the last few years.  This can be tried with conventional radiofrequency ablation or if there is a practitioner in the area that uses cooled RF.  ___________________________ Naaman PlummerFred Komal Stangelo Barnes-Jewish HospitalFAAPMR Board Certified, American Board of Physical Medicine and Rehabilitation    Nerve Conduction Studies Anti Sensory Summary Table   Stim Site NR Peak (ms) Norm  Peak (ms) P-T Amp (V) Norm P-T Amp Site1 Site2 Delta-P (ms) Dist (cm) Vel (m/s) Norm Vel (m/s)  Right Saphenous Anti Sensory (Ant Med Mall)  30.6C  14cm    3.8 <4.4 13.2 >2 14cm Ant Med Mall 3.8 0.0  >32  Right Sup Fibular Anti Sensory (Ant Lat Mall)  30.6C  14 cm    4.1 <4.4 5.4 >5.0 14 cm Ant Lat Mall 4.1 14.0 34 >32  Right Sural Anti Sensory (Lat Mall)  30.4C  Calf    3.7 <4.0 17.1 >5.0 Calf Lat Mall 3.7 14.0 38 >35   Motor Summary Table   Stim Site NR Onset (ms) Norm Onset (ms) O-P Amp (mV) Norm O-P Amp Site1 Site2 Delta-0 (ms) Dist (cm) Vel (m/s) Norm Vel (m/s)  Right Fibular Motor (Ext Dig Brev)  29.9C  Ankle    4.8 <6.1 6.3 >2.5 B Fib Ankle 6.9 32.0 46 >38  B Fib    11.7  7.6  Poplt B Fib 1.7 8.5 50 >40  Poplt    13.4  8.1         Right Tibial Motor (Abd Hall Brev)  30.7C  Ankle    4.3 <6.1 8.1 >3.0 Knee Ankle 8.4 42.5 51 >35  Knee    12.7  7.7  EMG   Side Muscle Nerve Root Ins Act Fibs Psw Amp Dur Poly Recrt Int Dennie Bible Comment  Right AntTibialis Dp Br Peron L4-5 Nml Nml Nml Nml Nml 0 *Reduced Nml   Right Fibularis Longus  Sup Br Peron L5-S1 Nml Nml Nml Nml Nml 0 Nml Nml   Right MedGastroc Tibial S1-2 Nml Nml Nml Nml Nml 0 Nml Nml   Right VastusMed Femoral L2-4 *Incr Nml Nml Nml Nml 0 Nml Nml   Right BicepsFemS Sciatic L5-S1 Nml Nml Nml Nml Nml 0 Nml Nml     Nerve Conduction Studies Anti Sensory Left/Right Comparison   Stim Site L Lat (ms) R Lat (ms) L-R Lat (ms) L Amp (V) R Amp (V) L-R Amp (%) Site1 Site2 L Vel (m/s) R Vel (m/s) L-R Vel (m/s)  Saphenous Anti Sensory (Ant Med Mall)  30.6C  14cm  3.8   13.2  14cm Ant Med Mall     Sup Fibular Anti Sensory (Ant Lat Mall)  30.6C  14 cm  4.1   5.4  14 cm Ant Lat Mall  34   Sural Anti Sensory (Lat Mall)  30.4C  Calf  3.7   17.1  Calf Lat Mall  38    Motor Left/Right Comparison   Stim Site L Lat (ms) R Lat (ms) L-R Lat (ms) L Amp (mV) R Amp (mV) L-R Amp (%) Site1 Site2 L Vel (m/s) R Vel (m/s) L-R Vel (m/s)    Fibular Motor (Ext Dig Brev)  29.9C  Ankle  4.8   6.3  B Fib Ankle  46   B Fib  11.7   7.6  Poplt B Fib  50   Poplt  13.4   8.1        Tibial Motor (Abd Hall Brev)  30.7C  Ankle  4.3   8.1  Knee Ankle  51   Knee  12.7   7.7           Waveforms:            Clinical History: MRI OF THE RIGHT KNEE WITHOUT CONTRAST  TECHNIQUE: Multiplanar, multisequence MR imaging of the knee was performed. No intravenous contrast was administered.  COMPARISON: Radiographs dated 08/15/2014 and MRI dated 08/27/2014  FINDINGS:: FINDINGS:  Joint: There is no appreciable joint effusion. The components of the total knee prosthesis obscures detail of the joint. There is no evidence of bone edema to suggest loosening of the prosthetic components.  Popliteal Fossa: No Baker's cyst.  Extensor Mechanism: Intact.  Bones: No significant abnormality.  Other: No soft tissue edema. No muscle atrophy. Adjacent tendons appear normal.  IMPRESSION: No appreciable abnormality of right knee. Detail is obscured by the components of the total knee prosthesis, as described above.   Electronically Signed By: Francene Boyers M.D. On: 09/13/2017 15:57  09/13/2014 EMG/NCS Impression: Essentially NORMAL electrodiagnostic study of the left lower limb.  *Suggestive of very mild L3/L4 radiculopathy.  There is no significant electrodiagnostic evidence of nerve entrapment, lumbosacral plexopathy lumbar radiculopathy.    As you know, purely sensory or demyelinating radiculopathies and chemical radiculitis may not be detected with this particular electrodiagnostic study.   She reports that she has been smoking cigarettes. She has a 8.00 pack-year smoking history. She has never used smokeless tobacco.  Recent Labs    03/03/17 1209 06/29/17 1032 08/09/17 0845 08/11/17 1624  HGBA1C 6.6 6.6 6.4  --   LABURIC  --   --   --  4.5    Objective:  VS:  HT:    WT:   BMI:     BP:   HR: bpm  TEMP: ( )   RESP:  Physical Exam  Constitutional: She is oriented to person, place, and time. She appears well-developed and well-nourished.  Eyes: Pupils are equal, round, and reactive to light. Conjunctivae and EOM are normal.  Cardiovascular: Normal rate and intact distal pulses.  Pulmonary/Chest: Effort normal.  Musculoskeletal: She exhibits no edema.  Examination of the lumbar spine shows a person standing with forward flexed lumbar spine with pain with extension of the lumbar spine with facet joint loading.  She has no pain with hip rotation bilaterally.  Mild pain over the greater trochanters.  Examining both legs show normal muscle bulk bilaterally.  She has good strength with the flexion she does lack some extension.  She has normal-appearing musculature particularly of the lower extremities with symmetric muscle bulk of the EDB musculature bilaterally.  Really any sort of movement manually of the leg causes pain.  Neurological: She is alert and oriented to person, place, and time. She exhibits normal muscle tone. Coordination normal.  Skin: Skin is warm and dry. No rash noted. No erythema.  Psychiatric: She has a normal mood and affect. Her behavior is normal.  Nursing note and vitals reviewed.   Ortho Exam Imaging: No results found.  Past Medical/Family/Surgical/Social History: Medications & Allergies reviewed per EMR, new medications updated. Patient Active Problem List   Diagnosis Date Noted  . Moderate persistent asthma without complication 06/29/2017  . Environmental and seasonal allergies 06/29/2017  . Controlled type 2 diabetes mellitus with diabetic polyneuropathy, without long-term current use of insulin (HCC) 06/29/2017  . Hearing difficulty of both ears 03/03/2017  . 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 Herniation of lumbar intervertebral disc with radiculopathy 10/02/2016    Class: Chronic  . Alopecia areata 08/19/2016  .  Chondromalacia of both patellae 06/03/2015    Class: Chronic  . Spinal stenosis, lumbar region, with neurogenic claudication 06/03/2015  . Tobacco use disorder 04/25/2015  . DJD (degenerative joint disease) of knee 01/04/2015  . Hemorrhoid 11/14/2014  . Gout of big toe 07/19/2014  . Essential hypertension 08/14/2013  . Gastroesophageal reflux disease without esophagitis 08/14/2013  . COPD (chronic obstructive pulmonary disease) (HCC) 04/17/2011   Past Medical History:  Diagnosis Date  . Arthritis   . 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 Asthma   . COPD (chronic obstructive pulmonary disease) (HCC)   . Diabetes mellitus    Type II  . GERD (gastroesophageal reflux disease)    Pt on Protonix daily  . Glaucoma   . Gout   . Headache(784.0)    otc meds prn  . Hyperlipidemia   . Hypertension    Pt on lisinopril  . Irritable bowel syndrome 11/19/2010  . Neuropathy   . Pneumonia   . Restless legs   . Shortness of breath    occasional - uses breathing tx at home   Family History  Problem Relation Age of Onset  . Hypertension Father   . Cancer Father   . Heart disease Mother   . Asthma Son        had as a child  . Heart disease Sister   . Breast cancer Sister   . Hypertension Brother    Past Surgical History:  Procedure Laterality Date  . CHOLECYSTECTOMY    . COLONOSCOPY    . ENDOMETRIAL ABLATION  10/2010  . HERNIA REPAIR     umbicial hernia  . KNEE ARTHROSCOPY Left    06/07/2017 Dr. August Saucer of [facility]  . KNEE CLOSED REDUCTION Right 12/06/2015   Procedure: CLOSED MANIPULATION RIGHT KNEE;  Surgeon: Kerrin Champagne, MD;  Location: [facility] OR;  Service: Orthopedics;  Laterality: Right;  . KNEE CLOSED REDUCTION Right 01/17/2016   Procedure: CLOSED MANIPULATION RIGHT KNEE;  Surgeon: Kerrin Champagne, MD;  Location: [facility] OR;  Service: Orthopedics;  Laterality: Right;  . KNEE JOINT MANIPULATION Right 12/06/2015  . LUMBAR DISC SURGERY  06/03/2015   L 2  L3 L4 L5     . LUMBAR LAMINECTOMY/DECOMPRESSION MICRODISCECTOMY N/A 06/03/2015   Procedure: Bilateral lateral recess decompression L2-3, L3-4, L4-5;  Surgeon: Kerrin Champagne, MD;  Location: [facility] OR;  Service: Orthopedics;  Laterality: N/A;  . LUMBAR LAMINECTOMY/DECOMPRESSION MICRODISCECTOMY N/A 10/02/2016   Procedure: Right L5-S1 Lateral Recess Decompression  microdiscectomy;  Surgeon: Kerrin Champagne, MD;  Location: [facility] OR;  Service: Orthopedics;  Laterality: N/A;  . svd      x 2  . TOTAL KNEE ARTHROPLASTY Right 09/06/2015   Procedure: RIGHT TOTAL KNEE ARTHROPLASTY;  Surgeon: Kerrin Champagne, MD;  Location: [facility] OR;  Service: Orthopedics;  Laterality: Right;  . TUBAL LIGATION    . UPPER GASTROINTESTINAL ENDOSCOPY  04/28/11   Social History   Occupational History  . Occupation: unemployed    Associate Professor: UNEMPLOYED  Tobacco Use  . Smoking status: Current Some Day Smoker    Packs/day: 0.25    Years: 32.00    Pack years: 8.00    Types: Cigarettes  . Smokeless tobacco: Never Used  Substance and Sexual Activity  . Alcohol use: No  . Drug use: No  . Sexual activity: Yes    Birth control/protection: Surgical

## 2017-10-01 NOTE — Procedures (Signed)
EMG & NCV Findings: All nerve conduction studies (as indicated in the following tables) were within normal limits.    Needle evaluation of the right anterior tibialis muscle showed diminished recruitment.  The right vastus medialis muscle showed increased insertional activity.  All remaining muscles (as indicated in the following table) showed no evidence of electrical instability.    Impression: Essentially NORMAL electrodiagnostic study of the right lower limb.  Nondiagnostic by criteria but some evidence of chronic very mild L3 or L4 radiculopathy.  Clinically she has more pain with movement and positioning of the leg that does not seem to fit with radiculopathy.  There is no significant electrodiagnostic evidence of nerve entrapment, lumbosacral plexopathy or lumbar radiculopathy.    As you know, purely sensory or demyelinating radiculopathies and chemical radiculitis may not be detected with this particular electrodiagnostic study.  Recommendations: 1.  Follow-up with referring physician. 2.  Continue current management of symptoms.  Could consider radiofrequency ablation of the knee which is a procedure that has gained some ground over the last few years.  This can be tried with conventional radiofrequency ablation or if there is a practitioner in the area that uses cooled RF.  ___________________________ Naaman PlummerFred Isael Stille [facility] Specialty HospitalFAAPMR Board Certified, American Board of Physical Medicine and Rehabilitation    Nerve Conduction Studies Anti Sensory Summary Table   Stim Site NR Peak (ms) Norm Peak (ms) P-T Amp (V) Norm P-T Amp Site1 Site2 Delta-P (ms) Dist (cm) Vel (m/s) Norm Vel (m/s)  Right Saphenous Anti Sensory (Ant Med Mall)  30.6C  14cm    3.8 <4.4 13.2 >2 14cm Ant Med Mall 3.8 0.0  >32  Right Sup Fibular Anti Sensory (Ant Lat Mall)  30.6C  14 cm    4.1 <4.4 5.4 >5.0 14 cm Ant Lat Mall 4.1 14.0 34 >32  Right Sural Anti Sensory (Lat Mall)  30.4C  Calf    3.7 <4.0 17.1 >5.0 Calf Lat  Mall 3.7 14.0 38 >35   Motor Summary Table   Stim Site NR Onset (ms) Norm Onset (ms) O-P Amp (mV) Norm O-P Amp Site1 Site2 Delta-0 (ms) Dist (cm) Vel (m/s) Norm Vel (m/s)  Right Fibular Motor (Ext Dig Brev)  29.9C  Ankle    4.8 <6.1 6.3 >2.5 B Fib Ankle 6.9 32.0 46 >38  B Fib    11.7  7.6  Poplt B Fib 1.7 8.5 50 >40  Poplt    13.4  8.1         Right Tibial Motor (Abd Hall Brev)  30.7C  Ankle    4.3 <6.1 8.1 >3.0 Knee Ankle 8.4 42.5 51 >35  Knee    12.7  7.7          EMG   Side Muscle Nerve Root Ins Act Fibs Psw Amp Dur Poly Recrt Int Dennie BiblePat Comment  Right AntTibialis Dp Br Peron L4-5 Nml Nml Nml Nml Nml 0 *Reduced Nml   Right Fibularis Longus  Sup Br Peron L5-S1 Nml Nml Nml Nml Nml 0 Nml Nml   Right MedGastroc Tibial S1-2 Nml Nml Nml Nml Nml 0 Nml Nml   Right VastusMed Femoral L2-4 *Incr Nml Nml Nml Nml 0 Nml Nml   Right BicepsFemS Sciatic L5-S1 Nml Nml Nml Nml Nml 0 Nml Nml     Nerve Conduction Studies Anti Sensory Left/Right Comparison   Stim Site L Lat (ms) R Lat (ms) L-R Lat (ms) L Amp (V) R Amp (V) L-R Amp (%) Site1 Site2 L Vel (m/s) R  Vel (m/s) L-R Vel (m/s)  Saphenous Anti Sensory (Ant Med Mall)  30.6C  14cm  3.8   13.2  14cm Ant Med Mall     Sup Fibular Anti Sensory (Ant Lat Mall)  30.6C  14 cm  4.1   5.4  14 cm Ant Lat Mall  34   Sural Anti Sensory (Lat Mall)  30.4C  Calf  3.7   17.1  Calf Lat Mall  38    Motor Left/Right Comparison   Stim Site L Lat (ms) R Lat (ms) L-R Lat (ms) L Amp (mV) R Amp (mV) L-R Amp (%) Site1 Site2 L Vel (m/s) R Vel (m/s) L-R Vel (m/s)  Fibular Motor (Ext Dig Brev)  29.9C  Ankle  4.8   6.3  B Fib Ankle  46   B Fib  11.7   7.6  Poplt B Fib  50   Poplt  13.4   8.1        Tibial Motor (Abd Hall Brev)  30.7C  Ankle  4.3   8.1  Knee Ankle  51   Knee  12.7   7.7           Waveforms:

## 2017-10-05 ENCOUNTER — Encounter (INDEPENDENT_AMBULATORY_CARE_PROVIDER_SITE_OTHER): Payer: Self-pay | Admitting: Physical Medicine and Rehabilitation

## 2017-10-05 ENCOUNTER — Other Ambulatory Visit: Payer: Self-pay | Admitting: Internal Medicine

## 2017-10-05 DIAGNOSIS — E1142 Type 2 diabetes mellitus with diabetic polyneuropathy: Secondary | ICD-10-CM

## 2017-10-07 ENCOUNTER — Encounter (INDEPENDENT_AMBULATORY_CARE_PROVIDER_SITE_OTHER): Payer: Self-pay | Admitting: Specialist

## 2017-10-07 ENCOUNTER — Ambulatory Visit (INDEPENDENT_AMBULATORY_CARE_PROVIDER_SITE_OTHER): Payer: Medicaid Other | Admitting: Specialist

## 2017-10-07 VITALS — BP 120/74 | HR 77 | Ht 66.0 in | Wt 160.0 lb

## 2017-10-07 DIAGNOSIS — M48062 Spinal stenosis, lumbar region with neurogenic claudication: Secondary | ICD-10-CM | POA: Diagnosis not present

## 2017-10-07 DIAGNOSIS — T8484XD Pain due to internal orthopedic prosthetic devices, implants and grafts, subsequent encounter: Secondary | ICD-10-CM

## 2017-10-07 DIAGNOSIS — E119 Type 2 diabetes mellitus without complications: Secondary | ICD-10-CM | POA: Diagnosis not present

## 2017-10-07 DIAGNOSIS — Z96651 Presence of right artificial knee joint: Secondary | ICD-10-CM

## 2017-10-07 MED ORDER — HYDROCODONE-ACETAMINOPHEN 5-325 MG PO TABS: 1.0000 | ORAL_TABLET | Freq: Four times a day (QID) | ORAL | 0 refills | Status: DC | PRN

## 2017-10-07 MED ORDER — GABAPENTIN 300 MG PO CAPS: 300.0000 mg | ORAL_CAPSULE | Freq: Three times a day (TID) | ORAL | 3 refills | Status: DC

## 2017-10-07 NOTE — Progress Notes (Signed)
Office Visit Note   Patient: Stacy [name]           Date of Birth: December 31, 1960           MRN: [id number] Visit Date: 10/07/2017              Requested by: Stacy [name] [address] [place], [place] [address] PCP: Stacy [name]   Assessment & Plan: Visit Diagnoses:  1. Spinal stenosis of lumbar region with neurogenic claudication   2. Pain due to total right knee replacement, subsequent encounter     Plan: Avoid bending, stooping and avoid lifting weights greater than 10 lbs. Avoid prolong standing and walking. Avoid frequent bending and stooping  No lifting greater than 10 lbs. May use ice or moist heat for pain. Weight loss is of benefit. Handicap license is approved. Consideration at this point includes a spinal cord stimulator  Which is placed first as an out patient and a temporary lead is used If there is relief then the stimulator can be converted to a permanent indwelling stimulator. Another consideration is to perform blocks of the right knee to determine if radiofrequency ablation ( permanent numbing of the nerves that Give sensation to the right knee) can be considered. Pain Management is recommended. I consider you to be totally disabled from the stand point of consideration of work.   Follow-Up Instructions: Return in about 3 months (around 01/06/2018).   Orders:  No orders of the defined types were placed in this encounter.  No orders of the defined types were placed in this encounter.     Procedures: No procedures performed   Clinical Data: No additional findings.   Subjective: Chief Complaint  Patient presents with  . Right Knee - Follow-up    EMG/NCS Review for Right Lower Extremity    57 year old female returns today with persistent right knee pain and difficulty with bending and extending the right knee. She has had right knee MRI for concerns of fluid in the posterior right knee diagnosed by ultrasound at [facility] med center  and was referred for evaluation. Saw Stacy [name] and Stacy [name] and a repeat U/S at [facility] with request for aspiration was negative for a fluid collection. MRI did not show abnormality of bone or fluid. But her pain in the right knee persists. EMG/NCV are showing some mild changes RIGHT L4 and L3 consistent as a source of ongoing knee pain MRI with DDD L2-3, L3-4, L4-5 and L5-S1 unfortunately suggest that a fusion if consider would require fusion L2 to S1, 4 levels of the 5 lumbar segments.    Review of Systems  Constitutional: Negative.   HENT: Negative.   Eyes: Negative.   Respiratory: Negative.   Cardiovascular: Negative.   Gastrointestinal: Negative.   Endocrine: Negative.   Genitourinary: Negative.   Musculoskeletal: Negative.   Skin: Negative.   Allergic/Immunologic: Negative.   Neurological: Negative.   Hematological: Negative.   Psychiatric/Behavioral: Negative.      Objective: Vital Signs: BP 120/74 (BP Location: Left Arm, Patient Position: Sitting)   Pulse 77   Ht 5\' 6"  (1.676 m)   Wt 160 lb (72.6 kg)   BMI 25.82 kg/m   Physical Exam  Constitutional: She is oriented to person, place, and time. She appears well-developed and well-nourished.  HENT:  Head: Normocephalic and atraumatic.  Eyes: Pupils are equal, round, and reactive to light. EOM are normal.  Neck: Normal range of motion. Neck supple.  Pulmonary/Chest: Effort normal and breath sounds normal.  Abdominal: Soft. Bowel sounds are normal.  Neurological: She is alert and oriented to person, place, and time.  Skin: Skin is warm and dry.  Psychiatric: She has a normal mood and affect. Her behavior is normal. Judgment and thought content normal.    Right Knee Exam  Right knee exam is normal.  Tenderness  The patient is experiencing tenderness in the lateral joint line and medial retinaculum.  Range of Motion  Extension: normal  Flexion: 100   Tests  McMurray:  Medial - negative Lateral  - negative Varus: negative Valgus: negative Lachman:  Anterior - negative    Posterior - negative Drawer:  Anterior - negative    Posterior - negative Pivot shift: negative Patellar apprehension: negative  Other  Erythema: absent Scars: present Sensation: decreased Pulse: present Swelling: mild   Left Knee Exam  Left knee exam is normal.   Back Exam   Tenderness  The patient is experiencing tenderness in the lumbar.  Range of Motion  Extension: abnormal  Flexion: abnormal  Lateral bend right: abnormal  Lateral bend left: abnormal  Rotation right: abnormal  Rotation left: abnormal   Tests  Straight leg raise right: negative Straight leg raise left: negative  Reflexes  Patellar: abnormal Achilles: normal Babinski's sign: normal   Other  Toe walk: abnormal Heel walk: abnormal Sensation: normal Gait: antalgic   Comments:  MRI with right lateral recess narrowing L5-S1. Right L3-4, L4-5 and L5-S1 foramenal narrowing due to disc narrowing and enlargment of the facets Lateral recess at L4-5 are decompressed. Severe DDD L4-5 with disc collapse and modic changes affecting the adjacent end plates [id number].      Specialty Comments:  No specialty comments available.  Imaging: No results found.   PMFS History: Patient Active Problem List   Diagnosis Date Noted  . Herniation of lumbar intervertebral disc with radiculopathy 10/02/2016    Priority: High    Class: Chronic  . Chondromalacia of both patellae 06/03/2015    Priority: High    Class: Chronic  . Moderate persistent asthma without complication 06/29/2017  . Environmental and seasonal allergies 06/29/2017  . Controlled type 2 diabetes mellitus with diabetic polyneuropathy, without long-term current use of insulin (HCC) 06/29/2017  . Hearing difficulty of both ears 03/03/2017  . 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 Alopecia areata 08/19/2016  . Spinal  stenosis, lumbar region, with neurogenic claudication 06/03/2015  . Tobacco use disorder 04/25/2015  . DJD (degenerative joint disease) of knee 01/04/2015  . Hemorrhoid 11/14/2014  . Gout of big toe 07/19/2014  . Essential hypertension 08/14/2013  . Gastroesophageal reflux disease without esophagitis 08/14/2013  . COPD (chronic obstructive pulmonary disease) (HCC) 04/17/2011   Past Medical History:  Diagnosis Date  . Arthritis   . 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 Asthma   . COPD (chronic obstructive pulmonary disease) (HCC)   . Diabetes mellitus    Type II  . GERD (gastroesophageal reflux disease)    Pt on Protonix daily  . Glaucoma   . Gout   . Headache(784.0)    otc meds prn  . Hyperlipidemia   . Hypertension    Pt on lisinopril  . Irritable bowel syndrome 11/19/2010  . Neuropathy   . Pneumonia   . Restless legs   . Shortness of breath    occasional - uses breathing tx at home    Family History  Problem Relation Age of Onset  . Hypertension Father   . Cancer Father   . Heart disease Mother   . Asthma Son        had as a child  . Heart disease Sister   . Breast cancer Sister   . Hypertension Brother     Past Surgical History:  Procedure Laterality Date  . CHOLECYSTECTOMY    . COLONOSCOPY    . ENDOMETRIAL ABLATION  10/2010  . HERNIA REPAIR     umbicial hernia  . KNEE ARTHROSCOPY Left    06/07/2017 Dr. August Saucer of [facility]  . KNEE CLOSED REDUCTION Right 12/06/2015   Procedure: CLOSED MANIPULATION RIGHT KNEE;  Surgeon: Kerrin Champagne, [name];  Location: [facility] OR;  Service: Orthopedics;  Laterality: Right;  . KNEE CLOSED REDUCTION Right 01/17/2016   Procedure: CLOSED MANIPULATION RIGHT KNEE;  Surgeon: Kerrin Champagne, [name];  Location: [facility] OR;  Service: Orthopedics;  Laterality: Right;  . KNEE JOINT MANIPULATION Right 12/06/2015  . LUMBAR DISC SURGERY  06/03/2015   L 2  L3 L4 L5   . LUMBAR LAMINECTOMY/DECOMPRESSION MICRODISCECTOMY N/A 06/03/2015    Procedure: Bilateral lateral recess decompression L2-3, L3-4, L4-5;  Surgeon: Kerrin Champagne, [name];  Location: [facility] OR;  Service: Orthopedics;  Laterality: N/A;  . LUMBAR LAMINECTOMY/DECOMPRESSION MICRODISCECTOMY N/A 10/02/2016   Procedure: Right L5-S1 Lateral Recess Decompression  microdiscectomy;  Surgeon: Kerrin Champagne, [name];  Location[facility];  Service: Orthopedics;  Laterality: N/A;  . svd      x 2  . TOTAL KNEE ARTHROPLASTY Right 09/06/2015   Procedure: RIGHT TOTAL KNEE ARTHROPLASTY;  Surgeon: Kerrin Champagne, [name];  Location: [facility] OR;  Service: Orthopedics;  Laterality: Right;  . TUBAL LIGATION    . UPPER GASTROINTESTINAL ENDOSCOPY  04/28/11   Social History   Occupational History  . Occupation: unemployed    Associate Professor: UNEMPLOYED  Tobacco Use  . Smoking status: Current Some Day Smoker    Packs/day: 0.25    Years: 32.00    Pack years: 8.00    Types: Cigarettes  . Smokeless tobacco: Never Used  Substance and Sexual Activity  . Alcohol use: No  . Drug use: No  . Sexual activity: Yes    Birth control/protection: Surgical

## 2017-10-07 NOTE — Patient Instructions (Signed)
Avoid bending, stooping and avoid lifting weights greater than 10 lbs. Avoid prolong standing and walking. Avoid frequent bending and stooping  No lifting greater than 10 lbs. May use ice or moist heat for pain. Weight loss is of benefit. Handicap license is approved. Consideration at this point includes a spinal cord stimulator  Which is placed first as an out patient and a temporary lead is used If there is relief then the stimulator can be converted to a permanent indwelling stimulator. Another consideration is to perform blocks of the right knee to determine if radiofrequency ablation ( permanent numbing of the nerves that Give sensation to the right knee) can be considered. Pain Management is recommended. I consider you to be totally disabled from the stand point of consideration of work.

## 2017-10-08 DIAGNOSIS — J3089 Other allergic rhinitis: Secondary | ICD-10-CM | POA: Diagnosis not present

## 2017-10-08 DIAGNOSIS — J3081 Allergic rhinitis due to animal (cat) (dog) hair and dander: Secondary | ICD-10-CM | POA: Diagnosis not present

## 2017-10-08 DIAGNOSIS — J301 Allergic rhinitis due to pollen: Secondary | ICD-10-CM | POA: Diagnosis not present

## 2017-10-14 DIAGNOSIS — J3081 Allergic rhinitis due to animal (cat) (dog) hair and dander: Secondary | ICD-10-CM | POA: Diagnosis not present

## 2017-10-14 DIAGNOSIS — J3089 Other allergic rhinitis: Secondary | ICD-10-CM | POA: Diagnosis not present

## 2017-10-14 DIAGNOSIS — F411 Generalized anxiety disorder: Secondary | ICD-10-CM | POA: Diagnosis not present

## 2017-10-14 DIAGNOSIS — J301 Allergic rhinitis due to pollen: Secondary | ICD-10-CM | POA: Diagnosis not present

## 2017-10-15 ENCOUNTER — Other Ambulatory Visit: Payer: Self-pay | Admitting: Internal Medicine

## 2017-10-15 DIAGNOSIS — Z1231 Encounter for screening mammogram for malignant neoplasm of breast: Secondary | ICD-10-CM

## 2017-10-18 ENCOUNTER — Telehealth (INDEPENDENT_AMBULATORY_CARE_PROVIDER_SITE_OTHER): Payer: Self-pay | Admitting: Specialist

## 2017-10-18 DIAGNOSIS — J3081 Allergic rhinitis due to animal (cat) (dog) hair and dander: Secondary | ICD-10-CM | POA: Diagnosis not present

## 2017-10-18 DIAGNOSIS — J3089 Other allergic rhinitis: Secondary | ICD-10-CM | POA: Diagnosis not present

## 2017-10-18 DIAGNOSIS — J301 Allergic rhinitis due to pollen: Secondary | ICD-10-CM | POA: Diagnosis not present

## 2017-10-18 NOTE — Telephone Encounter (Signed)
Patient called requesting a letter from Dr. Otelia SergeantNitka per their conversation on him typing her a letter for Disability so she can take to [employer] along with her paperwork.------Please advise

## 2017-10-18 NOTE — Telephone Encounter (Signed)
Patient called requesting a letter from Dr. Otelia SergeantNitka per their conversation on him typing her a letter for Disability so she can take to [employer] along with her paperwork.  Please call patient to advise  [phone]

## 2017-10-19 ENCOUNTER — Encounter (INDEPENDENT_AMBULATORY_CARE_PROVIDER_SITE_OTHER): Payer: Self-pay | Admitting: Physical Medicine and Rehabilitation

## 2017-10-19 ENCOUNTER — Ambulatory Visit (INDEPENDENT_AMBULATORY_CARE_PROVIDER_SITE_OTHER): Payer: Medicaid Other | Admitting: Physical Medicine and Rehabilitation

## 2017-10-19 VITALS — BP 108/63 | HR 75 | Ht 66.0 in | Wt 160.0 lb

## 2017-10-19 DIAGNOSIS — G8929 Other chronic pain: Secondary | ICD-10-CM

## 2017-10-19 DIAGNOSIS — M5416 Radiculopathy, lumbar region: Secondary | ICD-10-CM | POA: Diagnosis not present

## 2017-10-19 DIAGNOSIS — M961 Postlaminectomy syndrome, not elsewhere classified: Secondary | ICD-10-CM | POA: Diagnosis not present

## 2017-10-19 DIAGNOSIS — G894 Chronic pain syndrome: Secondary | ICD-10-CM

## 2017-10-19 DIAGNOSIS — M25561 Pain in right knee: Secondary | ICD-10-CM

## 2017-10-19 NOTE — Progress Notes (Signed)
 .  Numeric Pain Rating Scale and Functional Assessment Average Pain 8 Pain Right Now 7 My pain is constant, sharp and tingling Pain is worse with: walking and some activites Pain improves with: rest   In the last MONTH (on 0-10 scale) has pain interfered with the following?  1. General activity like being  able to carry out your everyday physical activities such as walking, climbing stairs, carrying groceries, or moving a chair?  Rating(7)  2. Relation with others like being able to carry out your usual social activities and roles such as  activities at home, at work and in your community. Rating(4)  3. Enjoyment of life such that you have  been bothered by emotional problems such as feeling anxious, depressed or irritable?  Rating(0)

## 2017-10-20 ENCOUNTER — Other Ambulatory Visit: Payer: Self-pay

## 2017-10-20 ENCOUNTER — Encounter: Payer: Self-pay | Admitting: Physician Assistant

## 2017-10-20 ENCOUNTER — Ambulatory Visit: Payer: Medicaid Other | Attending: Family Medicine | Admitting: Physician Assistant

## 2017-10-20 VITALS — BP 107/76 | HR 78 | Temp 99.0°F | Resp 20 | Wt 173.8 lb

## 2017-10-20 DIAGNOSIS — J9801 Acute bronchospasm: Secondary | ICD-10-CM | POA: Diagnosis not present

## 2017-10-20 DIAGNOSIS — J449 Chronic obstructive pulmonary disease, unspecified: Secondary | ICD-10-CM | POA: Diagnosis not present

## 2017-10-20 DIAGNOSIS — Z7951 Long term (current) use of inhaled steroids: Secondary | ICD-10-CM | POA: Diagnosis not present

## 2017-10-20 DIAGNOSIS — Z7984 Long term (current) use of oral hypoglycemic drugs: Secondary | ICD-10-CM | POA: Diagnosis not present

## 2017-10-20 DIAGNOSIS — J4520 Mild intermittent asthma, uncomplicated: Secondary | ICD-10-CM | POA: Diagnosis not present

## 2017-10-20 DIAGNOSIS — E119 Type 2 diabetes mellitus without complications: Secondary | ICD-10-CM | POA: Diagnosis not present

## 2017-10-20 DIAGNOSIS — E785 Hyperlipidemia, unspecified: Secondary | ICD-10-CM | POA: Diagnosis not present

## 2017-10-20 DIAGNOSIS — I10 Essential (primary) hypertension: Secondary | ICD-10-CM | POA: Insufficient documentation

## 2017-10-20 DIAGNOSIS — Z79899 Other long term (current) drug therapy: Secondary | ICD-10-CM | POA: Diagnosis not present

## 2017-10-20 DIAGNOSIS — J069 Acute upper respiratory infection, unspecified: Secondary | ICD-10-CM | POA: Diagnosis not present

## 2017-10-20 DIAGNOSIS — R05 Cough: Secondary | ICD-10-CM | POA: Diagnosis present

## 2017-10-20 DIAGNOSIS — R062 Wheezing: Secondary | ICD-10-CM | POA: Diagnosis present

## 2017-10-20 DIAGNOSIS — E1142 Type 2 diabetes mellitus with diabetic polyneuropathy: Secondary | ICD-10-CM

## 2017-10-20 LAB — POCT CBG (FASTING - GLUCOSE)-MANUAL ENTRY: Glucose Fasting, POC: 102 mg/dL — AB (ref 70–99)

## 2017-10-20 MED ORDER — PROMETHAZINE-DM 6.25-15 MG/5ML PO SYRP: 5.0000 mL | ORAL_SOLUTION | Freq: Four times a day (QID) | ORAL | 0 refills | Status: DC | PRN

## 2017-10-20 MED ORDER — ACCU-CHEK SOFT TOUCH LANCETS MISC: 12 refills | Status: AC

## 2017-10-20 MED ORDER — GLUCOSE BLOOD VI STRP: ORAL_STRIP | 12 refills | Status: DC

## 2017-10-20 MED ORDER — PREDNISONE 10 MG PO TABS: ORAL_TABLET | ORAL | 0 refills | Status: DC

## 2017-10-20 MED ORDER — ACCU-CHEK AVIVA PLUS W/DEVICE KIT
1.0000 | PACK | Freq: Three times a day (TID) | 0 refills | Status: DC
Start: 2017-10-20 — End: 2023-09-15

## 2017-10-20 MED ORDER — ACCU-CHEK AVIVA PLUS W/DEVICE KIT
1.0000 | PACK | Freq: Three times a day (TID) | 0 refills | Status: DC
Start: 2017-10-20 — End: 2017-10-20

## 2017-10-20 MED ORDER — AZITHROMYCIN 250 MG PO TABS: ORAL_TABLET | ORAL | 0 refills | Status: DC

## 2017-10-20 MED ORDER — ACCU-CHEK SOFT TOUCH LANCETS MISC
12 refills | Status: DC
Start: 2017-10-20 — End: 2017-10-20

## 2017-10-20 MED FILL — ACCU-CHEK AVIVA PLUS W/DEVI: W/DEVICE | 1 days supply | Qty: 1 | Fill #0

## 2017-10-20 MED FILL — PROMETHAZINE-DM SYRUP: 6.25-15 | 5 days supply | Qty: 118 | Fill #0

## 2017-10-20 MED FILL — ACCU-CHEK AVIVA PLUS TEST S: 25 days supply | Qty: 100 | Fill #0

## 2017-10-20 MED FILL — ACCU-CHEK SOFTCLIX LANCETS: 25 days supply | Qty: 100 | Fill #0

## 2017-10-20 MED FILL — predniSONE 10 MG TABS: 10 | 7 days supply | Qty: 21 | Fill #0

## 2017-10-20 MED FILL — AZITHROMYCIN 250 MG TABLET: 250 | 5 days supply | Qty: 6 | Fill #0

## 2017-10-20 NOTE — Progress Notes (Signed)
Patient ID: Stacy [name], female   DOB: 1960-05-28, 57 y.o.   MRN: [id number]   Stacy [name], is a 57 y.o. female  DUK:[id number]  BJS:[id number]  DOB - April 28, 1960  Subjective:  Chief Complaint and HPI: Stacy [name] is a 57 y.o. female here today for cough X 2 weeks with wheezing. Needs new nebulizer for home.  No fever.  Mucus is thick and yellow.  No OTCs.  She has been using inhalers with some relief.    Blood sugars running around 120s.  She uses her husbands meter but needs one for herself.  Tolerates prednisone fine and hasn't had to take it in about a year.    Social:  Married, not working, unable to walk far.    ROS:   Constitutional:  No f/c, No night sweats, No unexplained weight loss. EENT:  No vision changes, No blurry vision, No hearing changes.   Respiratory: + cough, No SOB, + wheezing Cardiac: No CP, no palpitations GI:  No abd pain, No N/V/D. GU: No Urinary s/sx Musculoskeletal: No joint pain Neuro: No headache, no dizziness, no motor weakness.  Skin: No rash Endocrine:  No polydipsia. No polyuria.  Psych: Denies SI/HI  No problems updated.  ALLERGIES: Allergies  Allergen Reactions  . Other Shortness Of Breath    UNSPECIFIED AGENTS Allergic to perfumes and cleaning products  . Ace Inhibitors Cough       . Aspirin Nausea Only    PAST MEDICAL HISTORY: Past Medical History:  Diagnosis Date  . Arthritis   . Arthrofibrosis of total knee replacement ([place])    right  . Asthma   . COPD (chronic obstructive pulmonary disease) ([place])   . Diabetes mellitus    Type II  . GERD (gastroesophageal reflux disease)    Pt on Protonix daily  . Glaucoma   . Gout   . Headache(784.0)    otc meds prn  . Hyperlipidemia   . Hypertension    Pt on lisinopril  . Irritable bowel syndrome 11/19/2010  . Neuropathy   . Pneumonia   . Restless legs   . Shortness of breath    occasional - uses breathing tx at home    MEDICATIONS AT HOME: Prior to Admission  medications   Medication Sig Start Date End Date Taking? Authorizing Provider  albuterol (PROVENTIL) (2.5 MG/3ML) 0.083% nebulizer solution USE 1 VIAL VIA NEBULIZER EVERY 6 HOURS AS NEEDED FOR WHEEZING 06/29/17  Yes [name], MD  allopurinol (ZYLOPRIM) 100 MG tablet Take 1 tablet (100 mg total) by mouth daily. 08/03/17  Yes [name], MD  budesonide-formoterol (SYMBICORT) 80-4.5 MCG/ACT inhaler INHALE TWO PUFFS BY MOUTH TWICE A DAY 02/17/17  Yes Tresa Garter, MD  celecoxib (CELEBREX) 200 MG capsule Take 1 capsule (200 mg total) by mouth 2 (two) times daily. 08/03/17  Yes [name], MD  cholecalciferol (VITAMIN D) 1000 units tablet Take 1,000 Units by mouth daily.   Yes [provider]  diclofenac sodium (VOLTAREN) 1 % GEL Apply 4 g topically 4 (four) times daily. 08/03/17  Yes [name], MD  EPINEPHrine 0.3 mg/0.3 mL IJ SOAJ injection 0.3 mg IM x 1 PRN for allergic reaction 05/17/17  Yes [name], MD  fluticasone [facility]) 50 MCG/ACT nasal spray Place 1 spray into both nostrils daily. 06/29/17  Yes [name], MD  gabapentin (NEURONTIN) 300 MG capsule Take 1 capsule (300 mg total) by mouth 3 (three) times daily. 10/07/17  Yes Basil Dess  E, MD  glimepiride (AMARYL) 2 MG tablet TAKE 1 TABLET (2 MG TOTAL) BY MOUTH DAILY BEFORE BREAKFAST. 10/06/17  Yes [name], MD  hydrOXYzine (ATARAX/VISTARIL) 10 MG tablet TAKE 1 TABLET IN THE MORNING AND NOON AND 2 TABLETS IN THE EVENING AS NEEDED 08/02/17  Yes [name], MD  loratadine (CLARITIN) 10 MG tablet Take 1 tablet (10 mg total) by mouth daily. 06/29/17  Yes [name], MD  methocarbamol (ROBAXIN) 500 MG tablet TAKE 1 TABLET (500 MG TOTAL) BY MOUTH EVERY 6 (SIX) HOURS AS NEEDED FOR MUSCLE SPASMS. 09/03/17  Yes [name], MD  MITIGARE 0.6 MG CAPS Take 1 capsule by mouth daily. 01/20/17  Yes Tresa Garter, MD  Multiple Vitamin (MULTIVITAMIN WITH MINERALS) TABS tablet Take 1  tablet by mouth daily.   Yes [provider]  NICOTINE STEP 2 14 MG/24HR patch PLACE 1 PATCH ONTO THE SKIN DAILY 07/02/17  Yes [name], MD  pantoprazole (PROTONIX) 40 MG tablet Take 1 tablet (40 mg total) by mouth daily. 11/23/16  Yes Tresa Garter, MD  simvastatin (ZOCOR) 20 MG tablet Take 1 tablet (20 mg total) by mouth at bedtime. 11/23/16  Yes Jegede, Marlena Clipper, MD  sucralfate (CARAFATE) 1 g tablet Take 1 g by mouth 4 (four) times daily -  with meals and at bedtime.   Yes [provider]  umeclidinium bromide (INCRUSE ELLIPTA) 62.5 MCG/INH AEPB Inhale 1 puff into the lungs daily. 08/09/17  Yes [name], MD  valsartan-hydrochlorothiazide (DIOVAN-HCT) 160-12.5 MG tablet Take 1 tablet by mouth daily. 11/23/16  Yes Tresa Garter, MD  Vitamins/Minerals TABS Take by mouth.   Yes [provider]  azithromycin (ZITHROMAX) 250 MG tablet Take 2 today then one daily 10/20/17   Argentina Donovan, PA-C  benzonatate (TESSALON) 100 MG capsule Take 1 capsule (100 mg total) by mouth every 8 (eight) hours. Patient not taking: Reported on 10/20/2017 07/28/17   Couture, Cortni S, PA-C  Blood Glucose Monitoring Suppl (ACCU-CHEK AVIVA PLUS) w/Device KIT 1 each by Does not apply route 3 (three) times daily. 10/20/17   Argentina Donovan, PA-C  diclofenac sodium (VOLTAREN) 1 % GEL Apply 4 g topically 4 (four) times daily. 09/16/17   [name], MD  fluocinonide (LIDEX) 0.05 % external solution Apply 1 application topically 2 (two) times daily. Patient not taking: Reported on 10/20/2017 10/09/16   Andrena Mews T, MD  glucose blood (TRUE METRIX BLOOD GLUCOSE TEST) test strip USE AS DIRECTED BY PHYSICIAN 10/20/17   Argentina Donovan, PA-C  hydrochlorothiazide (HYDRODIURIL) 25 MG tablet TAKE 1 TABLET (25 MG TOTAL) BY MOUTH DAILY. 08/02/17   [name], MD  HYDROcodone-acetaminophen (NORCO/VICODIN) 5-325 MG tablet Take 1 tablet by mouth every 6 (six) hours as  needed. Patient not taking: Reported on 10/20/2017 10/07/17   [name], MD  Lancets (ACCU-CHEK SOFT [facility]) lancets Use as instructed 10/20/17   Argentina Donovan, PA-C  predniSONE (DELTASONE) 10 MG tablet 6,5,4,3,2,1 take each days dose at once in the morning with food 10/20/17   Argentina Donovan, PA-C  promethazine-dextromethorphan (PROMETHAZINE-DM) 6.25-15 MG/5ML syrup Take 5 mLs by mouth 4 (four) times daily as needed for cough. At bedtime 10/20/17   Argentina Donovan, PA-C  Fluticasone-Salmeterol (ADVAIR) 500-50 MCG/DOSE AEPB Inhale 1 puff into the lungs every 12 (twelve) hours.    04/16/11  [provider]  lisinopril (PRINIVIL,ZESTRIL) 40 MG tablet Take 40 mg by mouth  daily.    04/16/11  [provider]     Objective:  EXAM:   Vitals:   10/20/17 0917  BP: 107/76  Pulse: 78  Resp: 20  Temp: 99 F (37.2 C)  SpO2: 100%  Weight: 173 lb 12.8 oz (78.8 kg)    General appearance : A&OX3. NAD. Non-toxic-appearing HEENT: Atraumatic and Normocephalic.  PERRLA. EOM intact.  TM clear B. Mouth-MMM, post pharynx WNL w/o erythema, No PND. Neck: supple, no JVD. No cervical lymphadenopathy. No thyromegaly Chest/Lungs:  Breathing-non-labored, Good air entry bilaterally, breath sounds normal without rales or rhonchi, there is mild wheezing throughout   CVS: S1 S2 regular, no murmurs, gallops, rubs  Abdomen: Bowel sounds present, Non tender and not distended with no gaurding, rigidity or rebound. Extremities: Bilateral Lower Ext shows no edema, both legs are warm to touch with = pulse throughout Neurology:  CN II-XII grossly intact, Non focal.   Psych:  TP linear. J/I WNL. Normal speech. Appropriate eye contact and affect.  Skin:  No Rash  Data Review Lab Results  Component Value Date   HGBA1C 6.4 08/09/2017   HGBA1C 6.6 06/29/2017   HGBA1C 6.6 03/03/2017     Assessment & Plan   1. Upper respiratory tract infection, unspecified type Will cover for atypicals due to  length of illness.  Defer flu shot to next visit due to illness and temp of 99.   - azithromycin (ZITHROMAX) 250 MG tablet; Take 2 today then one daily  Dispense: 6 tablet; Refill: 0 - promethazine-dextromethorphan (PROMETHAZINE-DM) 6.25-15 MG/5ML syrup; Take 5 mLs by mouth 4 (four) times daily as needed for cough. At bedtime  Dispense: 118 mL; Refill: 0  2. Controlled type 2 diabetes mellitus with diabetic polyneuropathy, without long-term current use of insulin (HCC) Blood sugar good today.  Watch closely with being prescribed prednisone.  Patient aware and expresses understanding.   - Glucose (CBG), Fasting - Blood Glucose Monitoring Suppl (ACCU-CHEK AVIVA PLUS) w/Device KIT; 1 each by Does not apply route 3 (three) times daily.  Dispense: 1 kit; Refill: 0 - glucose blood (TRUE METRIX BLOOD GLUCOSE TEST) test strip; USE AS DIRECTED BY PHYSICIAN  Dispense: 100 each; Refill: 12 - Lancets (ACCU-CHEK SOFT TOUCH) lancets; Use as instructed  Dispense: 100 each; Refill: 12  3. Bronchospasm - predniSONE (DELTASONE) 10 MG tablet; 6,5,4,3,2,1 take each days dose at once in the morning with food  Dispense: 21 tablet; Refill: 0  Patient have been counseled extensively about nutrition and exercise  Return for keep appt for next month.  The patient was given clear instructions to go to ER or return to medical center if symptoms don't improve, worsen or new problems develop. The patient verbalized understanding. The patient was told to call to get lab results if they haven't heard anything in the next week.     Freeman Caldron, PA-C [facility] [place], [place]   10/20/2017, 9:41 AM

## 2017-10-20 NOTE — Progress Notes (Signed)
Cough x  2 weeks  Productive with yellow phlegm No OTC

## 2017-10-21 DIAGNOSIS — F411 Generalized anxiety disorder: Secondary | ICD-10-CM | POA: Diagnosis not present

## 2017-10-22 ENCOUNTER — Telehealth: Payer: Self-pay

## 2017-10-22 NOTE — Telephone Encounter (Signed)
Completed [employer] application faxed to [employer] eligibility.  

## 2017-10-25 DIAGNOSIS — J3081 Allergic rhinitis due to animal (cat) (dog) hair and dander: Secondary | ICD-10-CM | POA: Diagnosis not present

## 2017-10-25 DIAGNOSIS — J301 Allergic rhinitis due to pollen: Secondary | ICD-10-CM | POA: Diagnosis not present

## 2017-10-25 DIAGNOSIS — J3089 Other allergic rhinitis: Secondary | ICD-10-CM | POA: Diagnosis not present

## 2017-10-28 ENCOUNTER — Ambulatory Visit (INDEPENDENT_AMBULATORY_CARE_PROVIDER_SITE_OTHER): Payer: Medicaid Other | Admitting: Specialist

## 2017-10-29 ENCOUNTER — Encounter (INDEPENDENT_AMBULATORY_CARE_PROVIDER_SITE_OTHER): Payer: Self-pay | Admitting: Physical Medicine and Rehabilitation

## 2017-10-29 NOTE — Progress Notes (Signed)
Stacy [name] - 57 y.o. female MRN [id number]  Date of birth: [birth year]  Office Visit Note: Visit Date: 10/19/2017 PCP: Marcine Matar, MD Referred by: Marcine Matar, MD  Subjective: Chief Complaint  Patient presents with  . Lower Back - Pain  . Right Leg - Tingling   HPI: Stacy [name] is a 57 year old female patient of Dr. [name] in our office.  She has had 2 prior lumbar surgeries with continued lumbar radiculitis in the right leg.  Electrodiagnostic study has shown very mild radiculopathy.  She has had no left-sided complaints in general.  She has had complicated right total knee arthroplasty with continued pain of the right knee.  She is failed conservative care including physical therapy and medication management and opioid medication management.  She reports recalcitrant pain that averages 8 out of 10.  This is constant sharp and tingling type pain in the right leg.  She has a hard time with increased pain with walking and activity.  She is somewhat better at rest.  She gets some pain relief with elevating the right leg.  This is a chronic issue this been going on for quite some time.  It does affect her daily activity.  She has not noted specific focal weakness.  No bowel or bladder changes no new trauma.  No other red flag complaints.  She has had advanced imaging both of the spine and knee.   Review of Systems  Constitutional: Negative for chills, fever, malaise/fatigue and weight loss.  HENT: Negative for hearing loss and sinus pain.   Eyes: Negative for blurred vision, double vision and photophobia.  Respiratory: Negative for cough and shortness of breath.   Cardiovascular: Negative for chest pain, palpitations and leg swelling.  Gastrointestinal: Negative for abdominal pain, nausea and vomiting.  Genitourinary: Negative for flank pain.  Musculoskeletal: Positive for back pain and joint pain. Negative for myalgias.       Right hip and leg pain  Skin: Negative for  itching and rash.  Neurological: Positive for tingling. Negative for tremors, focal weakness and weakness.  Endo/Heme/Allergies: Negative.   Psychiatric/Behavioral: Negative for depression.  All other systems reviewed and are negative.  Otherwise per HPI.  Assessment & Plan: Visit Diagnoses:  1. Lumbar radiculopathy   2. Post laminectomy syndrome   3. Chronic pain of right knee   4. Chronic pain syndrome     Plan: Findings:  Chronic recalcitrant severe low back right hip and leg pain and knee pain which is really failed conservative and nonconservative care including lumbar surgery and knee replacement.  Medications have been ineffectual.  This is been chronic and ongoing.  Patient would represent a decent candidate for trial of spinal cord stimulator as a last resort type of treatment.  Patient does have Medicaid so we do need to see if this is not approved procedure and can get approved.  Notes from myself as well as Dr. [name] can be utilized to see if we can get approval.  Patient does want to proceed with procedure if possible.  First step if we find that we can get this approved would be to have her see a pain psychologist probably Dr. Lindajo Royal or Dr. Zoila Shutter.  We discussed the need for this with the patient and the typical approval process.  We gave her a lot of information on spinal cord stimulators today.    Meds & Orders: No orders of the defined types were placed in this encounter.  No orders  of the defined types were placed in this encounter.   Follow-up: Return if symptoms worsen or fail to improve.   Procedures: No procedures performed  No notes on file   Clinical History: MRI OF THE RIGHT KNEE WITHOUT CONTRAST  TECHNIQUE: Multiplanar, multisequence MR imaging of the knee was performed. No intravenous contrast was administered.  COMPARISON: Radiographs dated 08/15/2014 and MRI dated 08/27/2014  FINDINGS:: FINDINGS:  Joint: There is no appreciable joint  effusion. The components of the total knee prosthesis obscures detail of the joint. There is no evidence of bone edema to suggest loosening of the prosthetic components.  Popliteal Fossa: No Baker's cyst.  Extensor Mechanism: Intact.  Bones: No significant abnormality.  Other: No soft tissue edema. No muscle atrophy. Adjacent tendons appear normal.  IMPRESSION: No appreciable abnormality of right knee. Detail is obscured by the components of the total knee prosthesis, as described above.   Electronically Signed By: Francene Boyers M.D. On: 09/13/2017 15:57  09/13/2014 EMG/NCS Impression: Essentially NORMAL electrodiagnostic study of the left lower limb.  *Suggestive of very mild L3/L4 radiculopathy.  There is no significant electrodiagnostic evidence of nerve entrapment, lumbosacral plexopathy lumbar radiculopathy.    As you know, purely sensory or demyelinating radiculopathies and chemical radiculitis may not be detected with this particular electrodiagnostic study.   She reports that she has been smoking cigarettes. She has a 8.00 pack-year smoking history. She has never used smokeless tobacco.  Recent Labs    03/03/17 1209 06/29/17 1032 08/09/17 0845 08/11/17 1624  HGBA1C 6.6 6.6 6.4  --   LABURIC  --   --   --  4.5    Objective:  VS:  HT:5\' 6"  (167.6 cm)   WT:160 lb (72.6 kg)  BMI:25.84    BP:108/63  HR:75bpm  TEMP: ( )  RESP:  Physical Exam  Constitutional: She is oriented to person, place, and time. She appears well-developed and well-nourished. No distress.  HENT:  Head: Normocephalic and atraumatic.  Nose: Nose normal.  Mouth/Throat: Oropharynx is clear and moist.  Eyes: Pupils are equal, round, and reactive to light. Conjunctivae are normal.  Neck: Normal range of motion. Neck supple.  Cardiovascular: Regular rhythm and intact distal pulses.  Pulmonary/Chest: Effort normal. No respiratory distress.  Abdominal: She exhibits no distension. There is  no guarding.  Musculoskeletal:  Patient has difficulty going from sit to stand she ambulates and moves with a very stiff right hip and knee.  She actually walks much better than she gets up and moves initially.  She has pain with any type of movement of the leg and exam of the leg.  She has good strength with all muscle groups tested although it does cause significant pain to try to do muscle testing on the right.  She has no allodynia she has no trophic changes.  Distal strength is definitely intact.  Neurological: She is alert and oriented to person, place, and time. She exhibits normal muscle tone. Coordination normal.  Skin: Skin is warm. No rash noted. No erythema.  Psychiatric: She has a normal mood and affect. Her behavior is normal.  Nursing note and vitals reviewed.   Ortho Exam Imaging: No results found.  Past Medical/Family/Surgical/Social History: Medications & Allergies reviewed per EMR, new medications updated. Patient Active Problem List   Diagnosis Date Noted  . Moderate persistent asthma without complication 06/29/2017  . Environmental and seasonal allergies 06/29/2017  . Controlled type 2 diabetes mellitus with diabetic polyneuropathy, without long-term current use of insulin (HCC)  06/29/2017  . Hearing difficulty of both ears 03/03/2017  . 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 Herniation of lumbar intervertebral disc with radiculopathy 10/02/2016    Class: Chronic  . Alopecia areata 08/19/2016  . Chondromalacia of both patellae 06/03/2015    Class: Chronic  . Spinal stenosis, lumbar region, with neurogenic claudication 06/03/2015  . Tobacco use disorder 04/25/2015  . DJD (degenerative joint disease) of knee 01/04/2015  . Hemorrhoid 11/14/2014  . Gout of big toe 07/19/2014  . Essential hypertension 08/14/2013  . Gastroesophageal reflux disease without esophagitis 08/14/2013  . COPD (chronic obstructive pulmonary disease)  (HCC) 04/17/2011   Past Medical History:  Diagnosis Date  . Arthritis   . 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 Asthma   . COPD (chronic obstructive pulmonary disease) (HCC)   . Diabetes mellitus    Type II  . GERD (gastroesophageal reflux disease)    Pt on Protonix daily  . Glaucoma   . Gout   . Headache(784.0)    otc meds prn  . Hyperlipidemia   . Hypertension    Pt on lisinopril  . Irritable bowel syndrome 11/19/2010  . Neuropathy   . Pneumonia   . Restless legs   . Shortness of breath    occasional - uses breathing tx at home   Family History  Problem Relation Age of Onset  . Hypertension Father   . Cancer Father   . Heart disease Mother   . Asthma Son        had as a child  . Heart disease Sister   . Breast cancer Sister   . Hypertension Brother    Past Surgical History:  Procedure Laterality Date  . CHOLECYSTECTOMY    . COLONOSCOPY    . ENDOMETRIAL ABLATION  10/2010  . HERNIA REPAIR     umbicial hernia  . KNEE ARTHROSCOPY Left    06/07/2017 Dr. August Saucer of [facility]  . KNEE CLOSED REDUCTION Right 12/06/2015   Procedure: CLOSED MANIPULATION RIGHT KNEE;  Surgeon: Kerrin Champagne, MD;  Location: [facility] OR;  Service: Orthopedics;  Laterality: Right;  . KNEE CLOSED REDUCTION Right 01/17/2016   Procedure: CLOSED MANIPULATION RIGHT KNEE;  Surgeon: Kerrin Champagne, MD;  Location: [facility] OR;  Service: Orthopedics;  Laterality: Right;  . KNEE JOINT MANIPULATION Right 12/06/2015  . LUMBAR DISC SURGERY  06/03/2015   L 2  L3 L4 L5   . LUMBAR LAMINECTOMY/DECOMPRESSION MICRODISCECTOMY N/A 06/03/2015   Procedure: Bilateral lateral recess decompression L2-3, L3-4, L4-5;  Surgeon: Kerrin Champagne, MD;  Location: [facility] OR;  Service: Orthopedics;  Laterality: N/A;  . LUMBAR LAMINECTOMY/DECOMPRESSION MICRODISCECTOMY N/A 10/02/2016   Procedure: Right L5-S1 Lateral Recess Decompression  microdiscectomy;  Surgeon: Kerrin Champagne, MD;  Location: [facility];  Service: Orthopedics;   Laterality: N/A;  . svd      x 2  . TOTAL KNEE ARTHROPLASTY Right 09/06/2015   Procedure: RIGHT TOTAL KNEE ARTHROPLASTY;  Surgeon: Kerrin Champagne, MD;  Location: [facility] OR;  Service: Orthopedics;  Laterality: Right;  . TUBAL LIGATION    . UPPER GASTROINTESTINAL ENDOSCOPY  04/28/11   Social History   Occupational History  . Occupation: unemployed    Associate Professor: UNEMPLOYED  Tobacco Use  . Smoking status: Current Some Day Smoker    Packs/day: 0.25    Years: 32.00    Pack years: 8.00    Types: Cigarettes  . Smokeless tobacco: Never Used  Substance and Sexual Activity  . Alcohol use: No  . Drug use: No  . Sexual activity: Yes    Birth control/protection: Surgical

## 2017-11-03 ENCOUNTER — Other Ambulatory Visit: Payer: Self-pay | Admitting: Internal Medicine

## 2017-11-03 DIAGNOSIS — K219 Gastro-esophageal reflux disease without esophagitis: Secondary | ICD-10-CM

## 2017-11-03 DIAGNOSIS — E119 Type 2 diabetes mellitus without complications: Secondary | ICD-10-CM

## 2017-11-03 DIAGNOSIS — J3089 Other allergic rhinitis: Secondary | ICD-10-CM | POA: Diagnosis not present

## 2017-11-03 DIAGNOSIS — J3081 Allergic rhinitis due to animal (cat) (dog) hair and dander: Secondary | ICD-10-CM | POA: Diagnosis not present

## 2017-11-03 DIAGNOSIS — F17200 Nicotine dependence, unspecified, uncomplicated: Secondary | ICD-10-CM

## 2017-11-03 DIAGNOSIS — J301 Allergic rhinitis due to pollen: Secondary | ICD-10-CM | POA: Diagnosis not present

## 2017-11-03 DIAGNOSIS — I10 Essential (primary) hypertension: Secondary | ICD-10-CM

## 2017-11-09 DIAGNOSIS — J301 Allergic rhinitis due to pollen: Secondary | ICD-10-CM | POA: Diagnosis not present

## 2017-11-09 DIAGNOSIS — J3081 Allergic rhinitis due to animal (cat) (dog) hair and dander: Secondary | ICD-10-CM | POA: Diagnosis not present

## 2017-11-09 DIAGNOSIS — J3089 Other allergic rhinitis: Secondary | ICD-10-CM | POA: Diagnosis not present

## 2017-11-11 ENCOUNTER — Ambulatory Visit
Admission: RE | Admit: 2017-11-11 | Discharge: 2017-11-11 | Disposition: A | Discharge status: Home / Self Care | Payer: Medicaid Other | Source: Ambulatory Visit | Attending: Internal Medicine | Admitting: Internal Medicine

## 2017-11-11 ENCOUNTER — Encounter: Payer: Self-pay | Admitting: Internal Medicine

## 2017-11-11 ENCOUNTER — Ambulatory Visit: Payer: Medicaid Other | Attending: Internal Medicine | Admitting: Internal Medicine

## 2017-11-11 VITALS — BP 116/76 | HR 82 | Temp 98.5°F | Resp 16 | Wt 178.4 lb

## 2017-11-11 DIAGNOSIS — G4700 Insomnia, unspecified: Secondary | ICD-10-CM

## 2017-11-11 DIAGNOSIS — E119 Type 2 diabetes mellitus without complications: Secondary | ICD-10-CM | POA: Diagnosis present

## 2017-11-11 DIAGNOSIS — Z23 Encounter for immunization: Secondary | ICD-10-CM | POA: Diagnosis not present

## 2017-11-11 DIAGNOSIS — M5116 Intervertebral disc disorders with radiculopathy, lumbar region: Secondary | ICD-10-CM | POA: Diagnosis not present

## 2017-11-11 DIAGNOSIS — M48062 Spinal stenosis, lumbar region with neurogenic claudication: Secondary | ICD-10-CM | POA: Diagnosis not present

## 2017-11-11 DIAGNOSIS — F17200 Nicotine dependence, unspecified, uncomplicated: Secondary | ICD-10-CM

## 2017-11-11 DIAGNOSIS — J4540 Moderate persistent asthma, uncomplicated: Secondary | ICD-10-CM | POA: Insufficient documentation

## 2017-11-11 DIAGNOSIS — Z7984 Long term (current) use of oral hypoglycemic drugs: Secondary | ICD-10-CM | POA: Diagnosis not present

## 2017-11-11 DIAGNOSIS — D649 Anemia, unspecified: Secondary | ICD-10-CM | POA: Insufficient documentation

## 2017-11-11 DIAGNOSIS — J328 Other chronic sinusitis: Secondary | ICD-10-CM | POA: Diagnosis not present

## 2017-11-11 DIAGNOSIS — Z1231 Encounter for screening mammogram for malignant neoplasm of breast: Secondary | ICD-10-CM

## 2017-11-11 DIAGNOSIS — F17210 Nicotine dependence, cigarettes, uncomplicated: Secondary | ICD-10-CM | POA: Diagnosis not present

## 2017-11-11 DIAGNOSIS — E785 Hyperlipidemia, unspecified: Secondary | ICD-10-CM | POA: Diagnosis not present

## 2017-11-11 DIAGNOSIS — G2581 Restless legs syndrome: Secondary | ICD-10-CM | POA: Insufficient documentation

## 2017-11-11 DIAGNOSIS — Z79899 Other long term (current) drug therapy: Secondary | ICD-10-CM | POA: Insufficient documentation

## 2017-11-11 DIAGNOSIS — Z96651 Presence of right artificial knee joint: Secondary | ICD-10-CM | POA: Diagnosis not present

## 2017-11-11 DIAGNOSIS — I10 Essential (primary) hypertension: Secondary | ICD-10-CM

## 2017-11-11 DIAGNOSIS — G8929 Other chronic pain: Secondary | ICD-10-CM | POA: Insufficient documentation

## 2017-11-11 DIAGNOSIS — Z886 Allergy status to analgesic agent status: Secondary | ICD-10-CM | POA: Diagnosis not present

## 2017-11-11 DIAGNOSIS — J449 Chronic obstructive pulmonary disease, unspecified: Secondary | ICD-10-CM

## 2017-11-11 DIAGNOSIS — K219 Gastro-esophageal reflux disease without esophagitis: Secondary | ICD-10-CM | POA: Diagnosis not present

## 2017-11-11 DIAGNOSIS — E1142 Type 2 diabetes mellitus with diabetic polyneuropathy: Secondary | ICD-10-CM

## 2017-11-11 DIAGNOSIS — Z8249 Family history of ischemic heart disease and other diseases of the circulatory system: Secondary | ICD-10-CM | POA: Insufficient documentation

## 2017-11-11 DIAGNOSIS — Z9049 Acquired absence of other specified parts of digestive tract: Secondary | ICD-10-CM | POA: Insufficient documentation

## 2017-11-11 DIAGNOSIS — M109 Gout, unspecified: Secondary | ICD-10-CM | POA: Diagnosis not present

## 2017-11-11 LAB — POCT GLYCOSYLATED HEMOGLOBIN (HGB A1C): HbA1c, POC (controlled diabetic range): 6.9 % (ref 0.0–7.0)

## 2017-11-11 LAB — GLUCOSE, POCT (MANUAL RESULT ENTRY): POC Glucose: 107 mg/dl — AB (ref 70–99)

## 2017-11-11 MED ORDER — UMECLIDINIUM BROMIDE 62.5 MCG/INH IN AEPB: 1.0000 | INHALATION_SPRAY | Freq: Every day | RESPIRATORY_TRACT | 11 refills | Status: DC

## 2017-11-11 MED ORDER — MELATONIN 3 MG PO TABS: 3.0000 mg | ORAL_TABLET | Freq: Every evening | ORAL | 3 refills | Status: DC | PRN

## 2017-11-11 MED ORDER — BUDESONIDE-FORMOTEROL FUMARATE 80-4.5 MCG/ACT IN AERO: INHALATION_SPRAY | RESPIRATORY_TRACT | 0 refills | Status: DC

## 2017-11-11 NOTE — Progress Notes (Signed)
Pt states she has pain coming from her b/l knees and back

## 2017-11-11 NOTE — Patient Instructions (Addendum)
Use the Symbicort inhaler twice daily as prescribed.  Please ask your pharmacy to fill the Incruse inhaler.  Continue to work on trying to quit smoking.  Insomnia Insomnia is a sleep disorder that makes it difficult to fall asleep or to stay asleep. Insomnia can cause tiredness (fatigue), low energy, difficulty concentrating, mood swings, and poor performance at work or school. There are three different ways to classify insomnia:  Difficulty falling asleep.  Difficulty staying asleep.  Waking up too early in the morning.  Any type of insomnia can be long-term (chronic) or short-term (acute). Both are common. Short-term insomnia usually lasts for three months or less. Chronic insomnia occurs at least three times a week for longer than three months. What are the causes? Insomnia may be caused by another condition, situation, or substance, such as:  Anxiety.  Certain medicines.  Gastroesophageal reflux disease (GERD) or other gastrointestinal conditions.  Asthma or other breathing conditions.  Restless legs syndrome, sleep apnea, or other sleep disorders.  Chronic pain.  Menopause. This may include hot flashes.  Stroke.  Abuse of alcohol, tobacco, or illegal drugs.  Depression.  Caffeine.  Neurological disorders, such as Alzheimer disease.  An overactive thyroid (hyperthyroidism).  The cause of insomnia may not be known. What increases the risk? Risk factors for insomnia include:  Gender. Women are more commonly affected than men.  Age. Insomnia is more common as you get older.  Stress. This may involve your professional or personal life.  Income. Insomnia is more common in people with lower income.  Lack of exercise.  Irregular work schedule or night shifts.  Traveling between different time zones.  What are the signs or symptoms? If you have insomnia, trouble falling asleep or trouble staying asleep is the main symptom. This may lead to other symptoms, such  as:  Feeling fatigued.  Feeling nervous about going to sleep.  Not feeling rested in the morning.  Having trouble concentrating.  Feeling irritable, anxious, or depressed.  How is this treated? Treatment for insomnia depends on the cause. If your insomnia is caused by an underlying condition, treatment will focus on addressing the condition. Treatment may also include:  Medicines to help you sleep.  Counseling or therapy.  Lifestyle adjustments.  Follow these instructions at home:  Take medicines only as directed by your health care provider.  Keep regular sleeping and waking hours. Avoid naps.  Keep a sleep diary to help you and your health care provider figure out what could be causing your insomnia. Include: ? When you sleep. ? When you wake up during the night. ? How well you sleep. ? How rested you feel the next day. ? Any side effects of medicines you are taking. ? What you eat and drink.  Make your bedroom a comfortable place where it is easy to fall asleep: ? Put up shades or special blackout curtains to block light from outside. ? Use a white noise machine to block noise. ? Keep the temperature cool.  Exercise regularly as directed by your health care provider. Avoid exercising right before bedtime.  Use relaxation techniques to manage stress. Ask your health care provider to suggest some techniques that may work well for you. These may include: ? Breathing exercises. ? Routines to release muscle tension. ? Visualizing peaceful scenes.  Cut back on alcohol, caffeinated beverages, and cigarettes, especially close to bedtime. These can disrupt your sleep.  Do not overeat or eat spicy foods right before bedtime. This can lead to  digestive discomfort that can make it hard for you to sleep.  Limit screen use before bedtime. This includes: ? Watching TV. ? Using your smartphone, tablet, and computer.  Stick to a routine. This can help you fall asleep faster.  Try to do a quiet activity, brush your teeth, and go to bed at the same time each night.  Get out of bed if you are still awake after 15 minutes of trying to sleep. Keep the lights down, but try reading or doing a quiet activity. When you feel sleepy, go back to bed.  Make sure that you drive carefully. Avoid driving if you feel very sleepy.  Keep all follow-up appointments as directed by your health care provider. This is important. Contact a health care provider if:  You are tired throughout the day or have trouble in your daily routine due to sleepiness.  You continue to have sleep problems or your sleep problems get worse. Get help right away if:  You have serious thoughts about hurting yourself or someone else. This information is not intended to replace advice given to you by your health care provider. Make sure you discuss any questions you have with your health care provider. Document Released: 01/17/2000 Document Revised: 06/21/2015 Document Reviewed: 10/20/2013 Elsevier Interactive Patient Education  2018 [employer]   Influenza Virus Vaccine injection (Fluarix) What is this medicine? INFLUENZA VIRUS VACCINE (in floo EN zuh VAHY ruhs vak SEEN) helps to reduce the risk of getting influenza also known as the flu. This medicine may be used for other purposes; ask your health care provider or pharmacist if you have questions. COMMON BRAND NAME(S): Fluarix, Fluzone What should I tell my health care provider before I take this medicine? They need to know if you have any of these conditions: -bleeding disorder like hemophilia -fever or infection -Guillain-Barre syndrome or other neurological problems -immune system problems -infection with the human immunodeficiency virus (HIV) or AIDS -low blood platelet counts -multiple sclerosis -an unusual or allergic reaction to influenza virus vaccine, eggs, chicken proteins, latex, gentamicin, other medicines, foods, dyes or  preservatives -pregnant or trying to get pregnant -breast-feeding How should I use this medicine? This vaccine is for injection into a muscle. It is given by a health care professional. A copy of Vaccine Information Statements will be given before each vaccination. Read this sheet carefully each time. The sheet may change frequently. Talk to your pediatrician regarding the use of this medicine in children. Special care may be needed. Overdosage: If you think you have taken too much of this medicine contact a poison control center or emergency room at once. NOTE: This medicine is only for you. Do not share this medicine with others. What if I miss a dose? This does not apply. What may interact with this medicine? -chemotherapy or radiation therapy -medicines that lower your immune system like etanercept, anakinra, infliximab, and adalimumab -medicines that treat or prevent blood clots like warfarin -phenytoin -steroid medicines like prednisone or cortisone -theophylline -vaccines This list may not describe all possible interactions. Give your health care provider a list of all the medicines, herbs, non-prescription drugs, or dietary supplements you use. Also tell them if you smoke, drink alcohol, or use illegal drugs. Some items may interact with your medicine. What should I watch for while using this medicine? Report any side effects that do not go away within 3 days to your doctor or health care professional. Call your health care provider if any unusual symptoms occur within  6 weeks of receiving this vaccine. You may still catch the flu, but the illness is not usually as bad. You cannot get the flu from the vaccine. The vaccine will not protect against colds or other illnesses that may cause fever. The vaccine is needed every year. What side effects may I notice from receiving this medicine? Side effects that you should report to your doctor or health care professional as soon as  possible: -allergic reactions like skin rash, itching or hives, swelling of the face, lips, or tongue Side effects that usually do not require medical attention (report to your doctor or health care professional if they continue or are bothersome): -fever -headache -muscle aches and pains -pain, tenderness, redness, or swelling at site where injected -weak or tired This list may not describe all possible side effects. Call your doctor for medical advice about side effects. You may report side effects to FDA at 1-800-FDA-1088. Where should I keep my medicine? This vaccine is only given in a clinic, pharmacy, doctor's office, or other health care setting and will not be stored at home. NOTE: This sheet is a summary. It may not cover all possible information. If you have questions about this medicine, talk to your doctor, pharmacist, or health care provider.  2018 Elsevier/Gold Standard (2007-08-17 09:30:40)

## 2017-11-11 NOTE — Progress Notes (Signed)
Patient ID: Stacy [name], female    DOB: 07-Aug-1960  MRN: [id number]  CC: Diabetes  Subjective: Stacy [name] is a 57 y.o. female who presents for chronic ds management. Her concerns today include:  Pt with hx of HTN, DM with neuropathy, HL, COPD, RLS, gout, tob dep, anemia, recurrent sinusitis  Lumbar Radiculopathy: Referred to Dr. Ernestina Patches by her orthopedic specialist Dr. [name] for continued lumbar radiculopathy symptoms.  Patient with history of 2 prior lumbar surgeries with resultant chronic back pain.  She apparently has failed conservative care including physical therapy and medication management.  Dr. Ernestina Patches wants to try her with a spinal cord stimulator.  They are working to try to get approval from her insurance.  Hx of Anemia:  Taking a MV tab that has 800 IU of Vit D.  She also has Vit D 1000 IU bottle with her and reports that she is taking 1 a day..  She thought it was an iron supplement tab  Receiving allergy shots 2 x a wk from Dr. Tiajuana Amass with [facility].  DM:  Checking BS 1 x a day before BF.  Has log book with her.  Range 89-130.  Eating habits:  Doing well Had eye exam 04/29/2017 at [facility]  Rxn new glasses but no funds to fill.  HL:  Compliant with and tolerating Simvastatin  Tob dep:  Still using the patches.  Down to 5 cig/day. She has not set a quit date as yet.   HTN:   No device to check BP.  Compliant with Diovan/HCTZ and HCTZ.  Denies any chest pains.  No lower extremity edema.  COPD: Still using her albuterol nebulizer treatments 3 times a day.  Using Symbicort once a day instead of twice daily as prescribed.  Never receive the Incruse inhaler that was prescribed on last visit.  She has home delivery through the [facility] and reports that they never brought it to her.  Insomnia: Complains of problems falling asleep x several mths.  She normally gets in bed at 9 PM and is not able to fall asleep until around  1 AM.  She endorses turning off all lights and sounds when she is in bed.  Denies drinking any caffeinated beverages in the evenings.  Dealing with chronic pain issues of lower back.  Patient Active Problem List   Diagnosis Date Noted  . Moderate persistent asthma without complication 56/25/6389  . Environmental and seasonal allergies 06/29/2017  . Controlled type 2 diabetes mellitus with diabetic polyneuropathy, without long-term current use of insulin ([place]) 06/29/2017  . Hearing difficulty of both ears 03/03/2017  . 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 Lichen planopilaris 37/34/2876  . Herniation of lumbar intervertebral disc with radiculopathy 10/02/2016    Class: Chronic  . Alopecia areata 08/19/2016  . Chondromalacia of both patellae 06/03/2015    Class: Chronic  . Spinal stenosis, lumbar region, with neurogenic claudication 06/03/2015  . Tobacco use disorder 04/25/2015  . DJD (degenerative joint disease) of knee 01/04/2015  . Hemorrhoid 11/14/2014  . Gout of big toe 07/19/2014  . Essential hypertension 08/14/2013  . Gastroesophageal reflux disease without esophagitis 08/14/2013  . COPD (chronic obstructive pulmonary disease) ([place]) 04/17/2011     Current Outpatient Medications on File Prior to Visit  Medication Sig Dispense Refill  . albuterol (PROVENTIL) (2.5 MG/3ML) 0.083% nebulizer solution USE 1 VIAL VIA NEBULIZER EVERY 6 HOURS AS NEEDED FOR WHEEZING 180 mL 6  .  allopurinol (ZYLOPRIM) 100 MG tablet Take 1 tablet (100 mg total) by mouth daily. 90 tablet 3  . Blood Glucose Monitoring Suppl (ACCU-CHEK AVIVA PLUS) w/Device KIT 1 each by Does not apply route 3 (three) times daily. 1 kit 0  . celecoxib (CELEBREX) 200 MG capsule Take 1 capsule (200 mg total) by mouth 2 (two) times daily. 180 capsule 3  . cholecalciferol (VITAMIN D) 1000 units tablet Take 1,000 Units by mouth daily.    . diclofenac sodium (VOLTAREN) 1 % GEL Apply 4 g topically 4 (four) times daily. 3 Tube 1   . diclofenac sodium (VOLTAREN) 1 % GEL Apply 4 g topically 4 (four) times daily. 5 Tube 3  . EPINEPHrine 0.3 mg/0.3 mL IJ SOAJ injection 0.3 mg IM x 1 PRN for allergic reaction 1 Device 1  . fluticasone (FLONASE) 50 MCG/ACT nasal spray Place 1 spray into both nostrils daily. 16 g 6  . gabapentin (NEURONTIN) 300 MG capsule Take 1 capsule (300 mg total) by mouth 3 (three) times daily. 270 capsule 3  . glimepiride (AMARYL) 2 MG tablet TAKE 1 TABLET (2 MG TOTAL) BY MOUTH DAILY BEFORE BREAKFAST. 30 tablet 2  . glucose blood (TRUE METRIX BLOOD GLUCOSE TEST) test strip USE AS DIRECTED BY PHYSICIAN 100 each 12  . hydrochlorothiazide (HYDRODIURIL) 25 MG tablet TAKE 1 TABLET (25 MG TOTAL) BY MOUTH DAILY. 90 tablet 3  . hydrOXYzine (ATARAX/VISTARIL) 10 MG tablet TAKE 1 TABLET IN THE MORNING AND NOON AND 2 TABLETS IN THE EVENING AS NEEDED 120 tablet 2  . Lancets (ACCU-CHEK SOFT TOUCH) lancets Use as instructed 100 each 12  . loratadine (CLARITIN) 10 MG tablet Take 1 tablet (10 mg total) by mouth daily. 30 tablet 11  . methocarbamol (ROBAXIN) 500 MG tablet TAKE 1 TABLET (500 MG TOTAL) BY MOUTH EVERY 6 (SIX) HOURS AS NEEDED FOR MUSCLE SPASMS. 60 tablet 1  . MITIGARE 0.6 MG CAPS Take 1 capsule by mouth daily. 30 capsule 3  . Multiple Vitamin (MULTIVITAMIN WITH MINERALS) TABS tablet Take 1 tablet by mouth daily.    . nicotine (NICODERM CQ - DOSED IN MG/24 HOURS) 14 mg/24hr patch PLACE 1 PATCH ONTO THE SKIN DAILY 30 patch 1  . pantoprazole (PROTONIX) 40 MG tablet TAKE 1 TABLET (40 MG TOTAL) BY MOUTH DAILY. 30 tablet 2  . simvastatin (ZOCOR) 20 MG tablet TAKE 1 TABLET (20 MG TOTAL) BY MOUTH AT BEDTIME. 30 tablet 2  . sucralfate (CARAFATE) 1 g tablet Take 1 g by mouth 4 (four) times daily -  with meals and at bedtime.    . valsartan-hydrochlorothiazide (DIOVAN-HCT) 160-12.5 MG tablet TAKE 1 TABLET BY MOUTH DAILY. 30 tablet 2  . Vitamins/Minerals TABS Take by mouth.    . [DISCONTINUED] Fluticasone-Salmeterol  (ADVAIR) 500-50 MCG/DOSE AEPB Inhale 1 puff into the lungs every 12 (twelve) hours.      . [DISCONTINUED] lisinopril (PRINIVIL,ZESTRIL) 40 MG tablet Take 40 mg by mouth daily.       No current facility-administered medications on file prior to visit.     Allergies  Allergen Reactions  . Other Shortness Of Breath    UNSPECIFIED AGENTS Allergic to perfumes and cleaning products  . Ace Inhibitors Cough       . Aspirin Nausea Only    Social History   Socioeconomic History  . Marital status: Married    Spouse name: Not on file  . Number of children: 2  . Years of education: Not on file  . Highest education  level: Not on file  Occupational History  . Occupation: unemployed    Fish farm manager: UNEMPLOYED  Social Needs  . Financial resource strain: Not on file  . Food insecurity:    Worry: Not on file    Inability: Not on file  . Transportation needs:    Medical: Not on file    Non-medical: Not on file  Tobacco Use  . Smoking status: Current Some Day Smoker    Packs/day: 0.25    Years: 32.00    Pack years: 8.00    Types: Cigarettes  . Smokeless tobacco: Never Used  Substance and Sexual Activity  . Alcohol use: No  . Drug use: No  . Sexual activity: Yes    Birth control/protection: Surgical  Lifestyle  . Physical activity:    Days per week: Not on file    Minutes per session: Not on file  . Stress: Not on file  Relationships  . Social connections:    Talks on phone: Not on file    Gets together: Not on file    Attends religious service: Not on file    Active member of club or organization: Not on file    Attends meetings of clubs or organizations: Not on file    Relationship status: Not on file  . Intimate partner violence:    Fear of current or ex partner: Not on file    Emotionally abused: Not on file    Physically abused: Not on file    Forced sexual activity: Not on file  Other Topics Concern  . Not on file  Social History Narrative  . Not on file    Family  History  Problem Relation Age of Onset  . Hypertension Father   . Cancer Father   . Heart disease Mother   . Asthma Son        had as a child  . Heart disease Sister   . Breast cancer Sister   . Hypertension Brother     Past Surgical History:  Procedure Laterality Date  . CHOLECYSTECTOMY    . COLONOSCOPY    . ENDOMETRIAL ABLATION  10/2010  . HERNIA REPAIR     umbicial hernia  . KNEE ARTHROSCOPY Left    06/07/2017 Dr. Marlou Sa of [facility]  . KNEE CLOSED REDUCTION Right 12/06/2015   Procedure: CLOSED MANIPULATION RIGHT KNEE;  Surgeon: [name], MD;  Location: [place];  Service: Orthopedics;  Laterality: Right;  . KNEE CLOSED REDUCTION Right 01/17/2016   Procedure: CLOSED MANIPULATION RIGHT KNEE;  Surgeon: [name], MD;  Location: [place];  Service: Orthopedics;  Laterality: Right;  . KNEE JOINT MANIPULATION Right 12/06/2015  . LUMBAR DISC SURGERY  06/03/2015   L 2  L3 L4 L5   . LUMBAR LAMINECTOMY/DECOMPRESSION MICRODISCECTOMY N/A 06/03/2015   Procedure: Bilateral lateral recess decompression L2-3, L3-4, L4-5;  Surgeon: [name], MD;  Location: [place];  Service: Orthopedics;  Laterality: N/A;  . LUMBAR LAMINECTOMY/DECOMPRESSION MICRODISCECTOMY N/A 10/02/2016   Procedure: Right L5-S1 Lateral Recess Decompression  microdiscectomy;  Surgeon: [name], MD;  Location: [place];  Service: Orthopedics;  Laterality: N/A;  . svd      x 2  . TOTAL KNEE ARTHROPLASTY Right 09/06/2015   Procedure: RIGHT TOTAL KNEE ARTHROPLASTY;  Surgeon: [name], MD;  Location: [place];  Service: Orthopedics;  Laterality: Right;  . TUBAL LIGATION    . UPPER GASTROINTESTINAL ENDOSCOPY  04/28/11    ROS: Review of Systems  Negative except as above.  PHYSICAL EXAM: BP 116/76   Pulse 82   Temp 98.5 F (36.9 C) (Oral)   Resp 16   Wt 178 lb 6.4 oz (80.9 kg)   SpO2 98%   BMI 28.79 kg/m   Physical Exam  General appearance - alert, well appearing, and in no distress Mental status - normal mood,  behavior, speech, dress, motor activity, and thought processes Neck - supple, no significant adenopathy Chest - clear to auscultation, no wheezes, rales or rhonchi, symmetric air entry Heart - normal rate, regular rhythm, normal S1, S2, no murmurs, rubs, clicks or gallops Extremities -no Le edema Diabetic Foot Exam - Simple   Simple Foot Form Visual Inspection See comments:  Yes Sensation Testing See comments:  Yes Pulse Check Posterior Tibialis and Dorsalis pulse intact bilaterally:  Yes Comments Toenails are thick with upward growth. Decreased sensation on plantar surface left foot. No ulcers or calluses on the feet.      BS 107/A1C 6.9 ASSESSMENT AND PLAN: 1. Controlled type 2 diabetes mellitus with diabetic polyneuropathy, without long-term current use of insulin ([facility]) Continue healthy eating habits.  Continue Amaryl. - POCT glucose (manual entry) - POCT glycosylated hemoglobin (Hb A1C) - Microalbumin / creatinine urine ratio - Comprehensive metabolic panel - CBC  2. Chronic obstructive pulmonary disease, unspecified COPD type ([place]) Advised patient to use the Symbicort twice a day.  I have resent the prescription for the Incruse Ellipta to her pharmacy. - umeclidinium bromide (INCRUSE ELLIPTA) 62.5 MCG/INH AEPB; Inhale 1 puff into the lungs daily.  Dispense: 1 each; Refill: 11 - budesonide-formoterol (SYMBICORT) 80-4.5 MCG/ACT inhaler; INHALE TWO PUFFS BY MOUTH TWICE A DAY  Dispense: 30.6 g; Refill: 0  3. Need for immunization against influenza - Flu Vaccine QUAD 36+ mos IM  4. Essential hypertension At goal.  Continue current medications.  5. Tobacco use disorder Commended her on cutting back.  Encouraged to set a quit date.  She will continue using the nicotine patches.  6. Hyperlipidemia, unspecified hyperlipidemia type - Lipid panel  7. Insomnia, unspecified type Good sleep hygiene discussed.  Advised patient to try getting in bed a little later than 9 PM  since she does not fall asleep until 1 AM.  She may want to try getting in bed at 12 AM.  We will try her with low-dose melatonin. - Melatonin 3 MG TABS; Take 1 tablet (3 mg total) by mouth at bedtime as needed.  Dispense: 30 tablet; Refill: 3  8. In regards to the vitamins that she is taking, patient advised to stop taking the vitamin D 1000 IU as the multivitamin tablet that she is taking has a vitamin D 800 IU in it per tab.  Will check CBC today to determine whether she still needs to be taking iron supplement.   Patient was given the opportunity to ask questions.  Patient verbalized understanding of the plan and was able to repeat key elements of the plan.   Orders Placed This Encounter  Procedures  . Flu Vaccine QUAD 36+ mos IM  . Microalbumin / creatinine urine ratio  . Lipid panel  . Comprehensive metabolic panel  . CBC  . POCT glucose (manual entry)  . POCT glycosylated hemoglobin (Hb A1C)     Requested Prescriptions   Signed Prescriptions Disp Refills  . umeclidinium bromide (INCRUSE ELLIPTA) 62.5 MCG/INH AEPB 1 each 11    Sig: Inhale 1 puff into the lungs daily.  . Melatonin 3 MG TABS 30  tablet 3    Sig: Take 1 tablet (3 mg total) by mouth at bedtime as needed.  . budesonide-formoterol (SYMBICORT) 80-4.5 MCG/ACT inhaler 30.6 g 0    Sig: INHALE TWO PUFFS BY MOUTH TWICE A DAY    Return in about 3 months (around 02/11/2018).  Karle Plumber, MD, FACP

## 2017-11-12 ENCOUNTER — Other Ambulatory Visit: Payer: Self-pay | Admitting: Internal Medicine

## 2017-11-12 ENCOUNTER — Telehealth: Payer: Self-pay

## 2017-11-12 DIAGNOSIS — J301 Allergic rhinitis due to pollen: Secondary | ICD-10-CM | POA: Diagnosis not present

## 2017-11-12 DIAGNOSIS — J3089 Other allergic rhinitis: Secondary | ICD-10-CM | POA: Diagnosis not present

## 2017-11-12 DIAGNOSIS — J3081 Allergic rhinitis due to animal (cat) (dog) hair and dander: Secondary | ICD-10-CM | POA: Diagnosis not present

## 2017-11-12 LAB — LIPID PANEL
Chol/HDL Ratio: 4 ratio (ref 0.0–4.4)
Cholesterol, Total: 189 mg/dL (ref 100–199)
HDL: 47 mg/dL (ref >39)
LDL Calculated: 99 mg/dL (ref 0–99)
Triglycerides: 217 mg/dL — ABNORMAL HIGH (ref 0–149)
VLDL Cholesterol Cal: 43 mg/dL — ABNORMAL HIGH (ref 5–40)

## 2017-11-12 LAB — COMPREHENSIVE METABOLIC PANEL
ALT: 29 IU/L (ref 0–32)
AST: 33 IU/L (ref 0–40)
Albumin/Globulin Ratio: 1.8 (ref 1.2–2.2)
Albumin: 4.8 g/dL (ref 3.5–5.5)
Alkaline Phosphatase: 114 IU/L (ref 39–117)
BUN/Creatinine Ratio: 9 (ref 9–23)
BUN: 8 mg/dL (ref 6–24)
Bilirubin Total: 0.2 mg/dL (ref 0.0–1.2)
CO2: 26 mmol/L (ref 20–29)
Calcium: 9.8 mg/dL (ref 8.7–10.2)
Chloride: 94 mmol/L — ABNORMAL LOW (ref 96–106)
Creatinine, Ser: 0.94 mg/dL (ref 0.57–1.00)
GFR calc Af Amer: 77 mL/min/{1.73_m2} (ref >59)
GFR calc non Af Amer: 67 mL/min/{1.73_m2} (ref >59)
Globulin, Total: 2.6 g/dL (ref 1.5–4.5)
Glucose: 60 mg/dL — ABNORMAL LOW (ref 65–99)
Potassium: 2.8 mmol/L — ABNORMAL LOW (ref 3.5–5.2)
Sodium: 139 mmol/L (ref 134–144)
Total Protein: 7.4 g/dL (ref 6.0–8.5)

## 2017-11-12 LAB — CBC
Hematocrit: 37 % (ref 34.0–46.6)
Hemoglobin: 11.7 g/dL (ref 11.1–15.9)
MCH: 25.8 pg — ABNORMAL LOW (ref 26.6–33.0)
MCHC: 31.6 g/dL (ref 31.5–35.7)
MCV: 82 fL (ref 79–97)
Platelets: 333 10*3/uL (ref 150–450)
RBC: 4.54 x10E6/uL (ref 3.77–5.28)
RDW: 15.6 % — ABNORMAL HIGH (ref 12.3–15.4)
WBC: 10.3 10*3/uL (ref 3.4–10.8)

## 2017-11-12 LAB — MICROALBUMIN / CREATININE URINE RATIO
Creatinine, Urine: 33.6 mg/dL
Microalb/Creat Ratio: <8.9 mg/g creat (ref 0.0–30.0)
Microalbumin, Urine: <3 ug/mL

## 2017-11-12 MED ORDER — POTASSIUM CHLORIDE ER 20 MEQ PO TBCR: 20.0000 meq | EXTENDED_RELEASE_TABLET | Freq: Every day | ORAL | 1 refills | Status: DC

## 2017-11-12 NOTE — Telephone Encounter (Signed)
Contacted pt to go over lab results pt is aware and doesn't have any questions or concerns 

## 2017-11-15 DIAGNOSIS — J3081 Allergic rhinitis due to animal (cat) (dog) hair and dander: Secondary | ICD-10-CM | POA: Diagnosis not present

## 2017-11-15 DIAGNOSIS — J3089 Other allergic rhinitis: Secondary | ICD-10-CM | POA: Diagnosis not present

## 2017-11-15 DIAGNOSIS — J301 Allergic rhinitis due to pollen: Secondary | ICD-10-CM | POA: Diagnosis not present

## 2017-11-16 ENCOUNTER — Telehealth (INDEPENDENT_AMBULATORY_CARE_PROVIDER_SITE_OTHER): Payer: Self-pay

## 2017-11-16 NOTE — Telephone Encounter (Signed)
Patient called and wanted to know status of her bone stimulator being approved. She would like a call back [phone]

## 2017-11-16 NOTE — Telephone Encounter (Signed)
Pt would like to proceed with SCS. Please Advise.

## 2017-11-18 DIAGNOSIS — J3089 Other allergic rhinitis: Secondary | ICD-10-CM | POA: Diagnosis not present

## 2017-11-18 DIAGNOSIS — J301 Allergic rhinitis due to pollen: Secondary | ICD-10-CM | POA: Diagnosis not present

## 2017-11-18 DIAGNOSIS — J3081 Allergic rhinitis due to animal (cat) (dog) hair and dander: Secondary | ICD-10-CM | POA: Diagnosis not present

## 2017-11-19 DIAGNOSIS — F411 Generalized anxiety disorder: Secondary | ICD-10-CM | POA: Diagnosis not present

## 2017-11-19 DIAGNOSIS — H5213 Myopia, bilateral: Secondary | ICD-10-CM | POA: Diagnosis not present

## 2017-11-22 DIAGNOSIS — J3081 Allergic rhinitis due to animal (cat) (dog) hair and dander: Secondary | ICD-10-CM | POA: Diagnosis not present

## 2017-11-22 DIAGNOSIS — J301 Allergic rhinitis due to pollen: Secondary | ICD-10-CM | POA: Diagnosis not present

## 2017-11-22 DIAGNOSIS — J3089 Other allergic rhinitis: Secondary | ICD-10-CM | POA: Diagnosis not present

## 2017-11-23 ENCOUNTER — Telehealth (INDEPENDENT_AMBULATORY_CARE_PROVIDER_SITE_OTHER): Payer: Self-pay | Admitting: Physical Medicine and Rehabilitation

## 2017-11-23 NOTE — Telephone Encounter (Signed)
The problem has been is that we need to see if Medicaid will approve and Apolinar Junes said they do but that we have to get approval not [facility], I will send her to [place] and [facility] note. I was wanting to see if we could find out if medicaid would cover before trouble of sending her to [facility]

## 2017-11-24 DIAGNOSIS — J301 Allergic rhinitis due to pollen: Secondary | ICD-10-CM | POA: Diagnosis not present

## 2017-11-24 DIAGNOSIS — J3081 Allergic rhinitis due to animal (cat) (dog) hair and dander: Secondary | ICD-10-CM | POA: Diagnosis not present

## 2017-11-24 DIAGNOSIS — J3089 Other allergic rhinitis: Secondary | ICD-10-CM | POA: Diagnosis not present

## 2017-11-26 ENCOUNTER — Encounter: Payer: Self-pay | Admitting: Podiatry

## 2017-11-26 ENCOUNTER — Ambulatory Visit (INDEPENDENT_AMBULATORY_CARE_PROVIDER_SITE_OTHER): Payer: Medicaid Other | Admitting: Podiatry

## 2017-11-26 DIAGNOSIS — L84 Corns and callosities: Secondary | ICD-10-CM

## 2017-11-26 DIAGNOSIS — M79674 Pain in right toe(s): Secondary | ICD-10-CM | POA: Diagnosis not present

## 2017-11-26 DIAGNOSIS — E1142 Type 2 diabetes mellitus with diabetic polyneuropathy: Secondary | ICD-10-CM | POA: Diagnosis not present

## 2017-11-26 DIAGNOSIS — B351 Tinea unguium: Secondary | ICD-10-CM | POA: Diagnosis not present

## 2017-11-26 DIAGNOSIS — M79675 Pain in left toe(s): Secondary | ICD-10-CM

## 2017-11-29 DIAGNOSIS — J3081 Allergic rhinitis due to animal (cat) (dog) hair and dander: Secondary | ICD-10-CM | POA: Diagnosis not present

## 2017-11-29 DIAGNOSIS — J3089 Other allergic rhinitis: Secondary | ICD-10-CM | POA: Diagnosis not present

## 2017-11-29 DIAGNOSIS — J301 Allergic rhinitis due to pollen: Secondary | ICD-10-CM | POA: Diagnosis not present

## 2017-12-03 ENCOUNTER — Other Ambulatory Visit: Payer: Self-pay | Admitting: Internal Medicine

## 2017-12-03 ENCOUNTER — Telehealth: Payer: Self-pay | Admitting: Internal Medicine

## 2017-12-03 DIAGNOSIS — J3089 Other allergic rhinitis: Secondary | ICD-10-CM | POA: Diagnosis not present

## 2017-12-03 DIAGNOSIS — E119 Type 2 diabetes mellitus without complications: Secondary | ICD-10-CM

## 2017-12-03 DIAGNOSIS — J3081 Allergic rhinitis due to animal (cat) (dog) hair and dander: Secondary | ICD-10-CM | POA: Diagnosis not present

## 2017-12-03 DIAGNOSIS — J301 Allergic rhinitis due to pollen: Secondary | ICD-10-CM | POA: Diagnosis not present

## 2017-12-03 NOTE — Telephone Encounter (Signed)
Patient called stating that she has been nauseous since Tuesday and would like to know what to do, states she has been eating ice chips. Please follow up.

## 2017-12-03 NOTE — Telephone Encounter (Signed)
Will forward to pcp

## 2017-12-03 NOTE — Telephone Encounter (Signed)
Pt scheduled an appoitnment for Monday 12/06/2017 with pcp

## 2017-12-06 ENCOUNTER — Ambulatory Visit: Payer: Medicaid Other | Attending: Internal Medicine | Admitting: Internal Medicine

## 2017-12-06 ENCOUNTER — Encounter: Payer: Self-pay | Admitting: Internal Medicine

## 2017-12-06 VITALS — BP 143/84 | HR 74 | Temp 98.6°F | Resp 16 | Wt 179.8 lb

## 2017-12-06 DIAGNOSIS — Z7984 Long term (current) use of oral hypoglycemic drugs: Secondary | ICD-10-CM | POA: Diagnosis not present

## 2017-12-06 DIAGNOSIS — J301 Allergic rhinitis due to pollen: Secondary | ICD-10-CM | POA: Diagnosis not present

## 2017-12-06 DIAGNOSIS — M109 Gout, unspecified: Secondary | ICD-10-CM | POA: Diagnosis not present

## 2017-12-06 DIAGNOSIS — E1142 Type 2 diabetes mellitus with diabetic polyneuropathy: Secondary | ICD-10-CM | POA: Insufficient documentation

## 2017-12-06 DIAGNOSIS — Z886 Allergy status to analgesic agent status: Secondary | ICD-10-CM | POA: Diagnosis not present

## 2017-12-06 DIAGNOSIS — J3089 Other allergic rhinitis: Secondary | ICD-10-CM | POA: Diagnosis not present

## 2017-12-06 DIAGNOSIS — J3081 Allergic rhinitis due to animal (cat) (dog) hair and dander: Secondary | ICD-10-CM | POA: Diagnosis not present

## 2017-12-06 DIAGNOSIS — G2581 Restless legs syndrome: Secondary | ICD-10-CM | POA: Diagnosis not present

## 2017-12-06 DIAGNOSIS — I10 Essential (primary) hypertension: Secondary | ICD-10-CM | POA: Diagnosis not present

## 2017-12-06 DIAGNOSIS — J449 Chronic obstructive pulmonary disease, unspecified: Secondary | ICD-10-CM | POA: Diagnosis not present

## 2017-12-06 DIAGNOSIS — R110 Nausea: Secondary | ICD-10-CM | POA: Diagnosis present

## 2017-12-06 DIAGNOSIS — J4540 Moderate persistent asthma, uncomplicated: Secondary | ICD-10-CM | POA: Diagnosis not present

## 2017-12-06 DIAGNOSIS — K219 Gastro-esophageal reflux disease without esophagitis: Secondary | ICD-10-CM | POA: Insufficient documentation

## 2017-12-06 DIAGNOSIS — Z7951 Long term (current) use of inhaled steroids: Secondary | ICD-10-CM | POA: Diagnosis not present

## 2017-12-06 DIAGNOSIS — M48062 Spinal stenosis, lumbar region with neurogenic claudication: Secondary | ICD-10-CM | POA: Diagnosis not present

## 2017-12-06 DIAGNOSIS — K529 Noninfective gastroenteritis and colitis, unspecified: Secondary | ICD-10-CM | POA: Insufficient documentation

## 2017-12-06 DIAGNOSIS — M5116 Intervertebral disc disorders with radiculopathy, lumbar region: Secondary | ICD-10-CM | POA: Insufficient documentation

## 2017-12-06 DIAGNOSIS — F17210 Nicotine dependence, cigarettes, uncomplicated: Secondary | ICD-10-CM | POA: Insufficient documentation

## 2017-12-06 DIAGNOSIS — Z79899 Other long term (current) drug therapy: Secondary | ICD-10-CM | POA: Diagnosis not present

## 2017-12-06 MED ORDER — PROMETHAZINE HCL 12.5 MG PO TABS: 12.5000 mg | ORAL_TABLET | Freq: Three times a day (TID) | ORAL | 0 refills | Status: DC | PRN

## 2017-12-06 NOTE — Patient Instructions (Signed)
Do liquid diet for the next 24 to 48 hours. Use the Phenergan as needed for nausea/vomiting. Purchase and use some Pepto-Bismol over-the-counter as needed to help settle your stomach and to decrease the diarrhea.   Viral Gastroenteritis, Adult Viral gastroenteritis is also known as the stomach flu. This condition is caused by certain germs (viruses). These germs can be passed from person to person very easily (are very contagious). This condition can cause sudden watery poop (diarrhea), fever, and throwing up (vomiting). Having watery poop and throwing up can make you feel weak and cause you to get dehydrated. Dehydration can make you tired and thirsty, make you have a dry mouth, and make it so you pee (urinate) less often. Older adults and people with other diseases or a weak defense system (immune system) are at higher risk for dehydration. It is important to replace the fluids that you lose from having watery poop and throwing up. Follow these instructions at home: Follow instructions from your doctor about how to care for yourself at home. Eating and drinking  Follow these instructions as told by your doctor:  Take an oral rehydration solution (ORS). This is a drink that is sold at pharmacies and stores.  Drink clear fluids in small amounts as you are able, such as: ? Water. ? Ice chips. ? Diluted fruit juice. ? Low-calorie sports drinks.  Eat bland, easy-to-digest foods in small amounts as you are able, such as: ? Bananas. ? Applesauce. ? Rice. ? Low-fat (lean) meats. ? Toast. ? Crackers.  Avoid fluids that have a lot of sugar or caffeine in them.  Avoid alcohol.  Avoid spicy or fatty foods.  General instructions  Drink enough fluid to keep your pee (urine) clear or pale yellow.  Wash your hands often. If you cannot use soap and water, use hand sanitizer.  Make sure that all people in your home wash their hands well and often.  Rest at home while you get  better.  Take over-the-counter and prescription medicines only as told by your doctor.  Watch your condition for any changes.  Take a warm bath to help with any burning or pain from having watery poop.  Keep all follow-up visits as told by your doctor. This is important. Contact a doctor if:  You cannot keep fluids down.  Your symptoms get worse.  You have new symptoms.  You feel light-headed or dizzy.  You have muscle cramps. Get help right away if:  You have chest pain.  You feel very weak or you pass out (faint).  You see blood in your throw-up.  Your throw-up looks like coffee grounds.  You have bloody or black poop (stools) or poop that look like tar.  You have a very bad headache, a stiff neck, or both.  You have a rash.  You have very bad pain, cramping, or bloating in your belly (abdomen).  You have trouble breathing.  You are breathing very quickly.  Your heart is beating very quickly.  Your skin feels cold and clammy.  You feel confused.  You have pain when you pee.  You have signs of dehydration, such as: ? Dark pee, hardly any pee, or no pee. ? Cracked lips. ? Dry mouth. ? Sunken eyes. ? Sleepiness. ? Weakness. This information is not intended to replace advice given to you by your health care provider. Make sure you discuss any questions you have with your health care provider. Document Released: 07/08/2007 Document Revised: 08/09/2015 Document Reviewed: 09/25/2014  Elsevier Interactive Patient Education  2017 [employer]  

## 2017-12-06 NOTE — Progress Notes (Signed)
Pt states she has been nausea since tuesday of last week  Pt states that water or ginger ale doesn't help  Pt states she has bene having some vomiting

## 2017-12-06 NOTE — Progress Notes (Signed)
Patient ID: Dulse Rutan [facility], female    DOB: 1960-08-18  MRN: [id number]  CC: Nausea   Subjective: Stacy [name] is a 57 y.o. female who presents for UC visit. Her concerns today include:  Pt with hx of HTN, DM with neuropathy, HL, COPD, RLS, gout, tob dep, anemia, recurrent sinusitis  Pt c/o nausea,cramps, diarrhea and hot/cold chills since last visit. -having 3-4 BMs/day of loose stools.  No blood in stools.  Not on any abx over past 3 wks. No one else in house sick. Not taking anything for symptoms   Patient Active Problem List   Diagnosis Date Noted  . Moderate persistent asthma without complication 32/95/1884  . Environmental and seasonal allergies 06/29/2017  . Controlled type 2 diabetes mellitus with diabetic polyneuropathy, without long-term current use of insulin ([place]) 06/29/2017  . Hearing difficulty of both ears 03/03/2017  . 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 Lichen planopilaris 16/60/6301  . Herniation of lumbar intervertebral disc with radiculopathy 10/02/2016    Class: Chronic  . Alopecia areata 08/19/2016  . Chondromalacia of both patellae 06/03/2015    Class: Chronic  . Spinal stenosis, lumbar region, with neurogenic claudication 06/03/2015  . Tobacco use disorder 04/25/2015  . DJD (degenerative joint disease) of knee 01/04/2015  . Hemorrhoid 11/14/2014  . Gout of big toe 07/19/2014  . Essential hypertension 08/14/2013  . Gastroesophageal reflux disease without esophagitis 08/14/2013  . COPD (chronic obstructive pulmonary disease) ([place]) 04/17/2011     Current Outpatient Medications on File Prior to Visit  Medication Sig Dispense Refill  . ACCU-CHEK AVIVA PLUS test strip USE 3 TIMES A DAY AS DIRECTED BY PHYSICIAN 100 each 12  . ACCU-CHEK SOFTCLIX LANCETS lancets USE AS INSTRUCTED 3 TIMES A DAY 100 each 12  . albuterol (PROVENTIL) (2.5 MG/3ML) 0.083% nebulizer solution USE 1 VIAL VIA NEBULIZER EVERY 6 HOURS AS NEEDED FOR WHEEZING 180 mL 6   . allopurinol (ZYLOPRIM) 100 MG tablet Take 1 tablet (100 mg total) by mouth daily. 90 tablet 3  . Blood Glucose Monitoring Suppl (ACCU-CHEK AVIVA PLUS) w/Device KIT 1 each by Does not apply route 3 (three) times daily. 1 kit 0  . budesonide-formoterol (SYMBICORT) 80-4.5 MCG/ACT inhaler INHALE TWO PUFFS BY MOUTH TWICE A DAY 30.6 g 0  . celecoxib (CELEBREX) 200 MG capsule Take 1 capsule (200 mg total) by mouth 2 (two) times daily. 180 capsule 3  . cholecalciferol (VITAMIN D) 1000 units tablet Take 1,000 Units by mouth daily.    . diclofenac sodium (VOLTAREN) 1 % GEL Apply 4 g topically 4 (four) times daily. 3 Tube 1  . diclofenac sodium (VOLTAREN) 1 % GEL Apply 4 g topically 4 (four) times daily. 5 Tube 3  . EPINEPHrine 0.3 mg/0.3 mL IJ SOAJ injection 0.3 mg IM x 1 PRN for allergic reaction 1 Device 1  . fluticasone (FLONASE) 50 MCG/ACT nasal spray Place 1 spray into both nostrils daily. 16 g 6  . gabapentin (NEURONTIN) 300 MG capsule Take 1 capsule (300 mg total) by mouth 3 (three) times daily. 270 capsule 3  . glimepiride (AMARYL) 2 MG tablet TAKE 1 TABLET (2 MG TOTAL) BY MOUTH DAILY BEFORE BREAKFAST. 30 tablet 2  . hydrochlorothiazide (HYDRODIURIL) 25 MG tablet TAKE 1 TABLET (25 MG TOTAL) BY MOUTH DAILY. 90 tablet 3  . hydrOXYzine (ATARAX/VISTARIL) 10 MG tablet TAKE 1 TABLET IN THE MORNING AND NOON AND 2 TABLETS IN THE EVENING AS NEEDED 120 tablet 2  . Lancets (ACCU-CHEK SOFT TOUCH) lancets  Use as instructed 100 each 12  . loratadine (CLARITIN) 10 MG tablet Take 1 tablet (10 mg total) by mouth daily. 30 tablet 11  . Melatonin 3 MG TABS Take 1 tablet (3 mg total) by mouth at bedtime as needed. 30 tablet 3  . methocarbamol (ROBAXIN) 500 MG tablet TAKE 1 TABLET (500 MG TOTAL) BY MOUTH EVERY 6 (SIX) HOURS AS NEEDED FOR MUSCLE SPASMS. 60 tablet 1  . MITIGARE 0.6 MG CAPS Take 1 capsule by mouth daily. 30 capsule 3  . Multiple Vitamin (MULTIVITAMIN WITH MINERALS) TABS tablet Take 1 tablet by mouth  daily.    . nicotine (NICODERM CQ - DOSED IN MG/24 HOURS) 14 mg/24hr patch PLACE 1 PATCH ONTO THE SKIN DAILY 30 patch 1  . pantoprazole (PROTONIX) 40 MG tablet TAKE 1 TABLET (40 MG TOTAL) BY MOUTH DAILY. 30 tablet 2  . potassium chloride 20 MEQ TBCR Take 20 mEq by mouth daily. 30 tablet 1  . simvastatin (ZOCOR) 20 MG tablet TAKE 1 TABLET (20 MG TOTAL) BY MOUTH AT BEDTIME. 30 tablet 2  . sucralfate (CARAFATE) 1 g tablet Take 1 g by mouth 4 (four) times daily -  with meals and at bedtime.    Marland Kitchen umeclidinium bromide (INCRUSE ELLIPTA) 62.5 MCG/INH AEPB Inhale 1 puff into the lungs daily. 1 each 11  . valsartan-hydrochlorothiazide (DIOVAN-HCT) 160-12.5 MG tablet TAKE 1 TABLET BY MOUTH DAILY. 30 tablet 2  . Vitamins/Minerals TABS Take by mouth.    . [DISCONTINUED] Fluticasone-Salmeterol (ADVAIR) 500-50 MCG/DOSE AEPB Inhale 1 puff into the lungs every 12 (twelve) hours.      . [DISCONTINUED] lisinopril (PRINIVIL,ZESTRIL) 40 MG tablet Take 40 mg by mouth daily.       No current facility-administered medications on file prior to visit.     Allergies  Allergen Reactions  . Other Shortness Of Breath    UNSPECIFIED AGENTS Allergic to perfumes and cleaning products  . Ace Inhibitors Cough       . Aspirin Nausea Only    Social History   Socioeconomic History  . Marital status: Married    Spouse name: Not on file  . Number of children: 2  . Years of education: Not on file  . Highest education level: Not on file  Occupational History  . Occupation: unemployed    Fish farm manager: UNEMPLOYED  Social Needs  . Financial resource strain: Not on file  . Food insecurity:    Worry: Not on file    Inability: Not on file  . Transportation needs:    Medical: Not on file    Non-medical: Not on file  Tobacco Use  . Smoking status: Current Some Day Smoker    Packs/day: 0.25    Years: 32.00    Pack years: 8.00    Types: Cigarettes  . Smokeless tobacco: Never Used  Substance and Sexual Activity  .  Alcohol use: No  . Drug use: No  . Sexual activity: Yes    Birth control/protection: Surgical  Lifestyle  . Physical activity:    Days per week: Not on file    Minutes per session: Not on file  . Stress: Not on file  Relationships  . Social connections:    Talks on phone: Not on file    Gets together: Not on file    Attends religious service: Not on file    Active member of club or organization: Not on file    Attends meetings of clubs or organizations: Not on file  Relationship status: Not on file  . Intimate partner violence:    Fear of current or ex partner: Not on file    Emotionally abused: Not on file    Physically abused: Not on file    Forced sexual activity: Not on file  Other Topics Concern  . Not on file  Social History Narrative  . Not on file    Family History  Problem Relation Age of Onset  . Hypertension Father   . Cancer Father   . Heart disease Mother   . Asthma Son        had as a child  . Heart disease Sister   . Breast cancer Sister   . Hypertension Brother     Past Surgical History:  Procedure Laterality Date  . CHOLECYSTECTOMY    . COLONOSCOPY    . ENDOMETRIAL ABLATION  10/2010  . HERNIA REPAIR     umbicial hernia  . KNEE ARTHROSCOPY Left    06/07/2017 Dr. Marlou Sa of [facility]  . KNEE CLOSED REDUCTION Right 12/06/2015   Procedure: CLOSED MANIPULATION RIGHT KNEE;  Surgeon: [name], MD;  Location: [place];  Service: Orthopedics;  Laterality: Right;  . KNEE CLOSED REDUCTION Right 01/17/2016   Procedure: CLOSED MANIPULATION RIGHT KNEE;  Surgeon: [name], MD;  Location: [place];  Service: Orthopedics;  Laterality: Right;  . KNEE JOINT MANIPULATION Right 12/06/2015  . LUMBAR DISC SURGERY  06/03/2015   L 2  L3 L4 L5   . LUMBAR LAMINECTOMY/DECOMPRESSION MICRODISCECTOMY N/A 06/03/2015   Procedure: Bilateral lateral recess decompression L2-3, L3-4, L4-5;  Surgeon: [name], MD;  Location: [place];  Service: Orthopedics;  Laterality: N/A;    . LUMBAR LAMINECTOMY/DECOMPRESSION MICRODISCECTOMY N/A 10/02/2016   Procedure: Right L5-S1 Lateral Recess Decompression  microdiscectomy;  Surgeon: [name], MD;  Location: [place];  Service: Orthopedics;  Laterality: N/A;  . svd      x 2  . TOTAL KNEE ARTHROPLASTY Right 09/06/2015   Procedure: RIGHT TOTAL KNEE ARTHROPLASTY;  Surgeon: [name], MD;  Location: [place];  Service: Orthopedics;  Laterality: Right;  . TUBAL LIGATION    . UPPER GASTROINTESTINAL ENDOSCOPY  04/28/11    ROS: Review of Systems Negative except as above PHYSICAL EXAM: BP (!) 143/84   Pulse 74   Temp 98.6 F (37 C) (Oral)   Resp 16   Wt 179 lb 12.8 oz (81.6 kg)   SpO2 97%   BMI 29.02 kg/m   Wt Readings from Last 3 Encounters:  12/06/17 179 lb 12.8 oz (81.6 kg)  11/11/17 178 lb 6.4 oz (80.9 kg)  10/20/17 173 lb 12.8 oz (78.8 kg)    Physical Exam  General appearance - alert, well appearing, and in no distress Mental status - normal mood, behavior, speech, dress, motor activity, and thought processes Mouth -oral mucosa a little dry. Abdomen -nondistended, nontender.  Sluggish bowel sounds  ASSESSMENT AND PLAN: 1. Acute gastroenteritis Likely viral.  Recommend that she push fluids and try brat diet. Given Phenergan to use as needed for nausea.  Recommend purchasing Pepto-Bismol over-the-counter and using as needed for the abdominal cramps and diarrhea. - promethazine (PHENERGAN) 12.5 MG tablet; Take 1 tablet (12.5 mg total) by mouth every 8 (eight) hours as needed for nausea or vomiting.  Dispense: 15 tablet; Refill: 0  There are no diagnoses linked to this encounter.   Patient was given the opportunity to ask questions.  Patient verbalized understanding of the plan  and was able to repeat key elements of the plan.   No orders of the defined types were placed in this encounter.    Requested Prescriptions   Signed Prescriptions Disp Refills  . promethazine (PHENERGAN) 12.5 MG tablet 15 tablet  0    Sig: Take 1 tablet (12.5 mg total) by mouth every 8 (eight) hours as needed for nausea or vomiting.    Return if symptoms worsen or fail to improve.  Karle Plumber, MD, FACP

## 2017-12-07 ENCOUNTER — Telehealth: Payer: Self-pay

## 2017-12-07 NOTE — Telephone Encounter (Signed)
This CM spoke to Courtney/[facility] who confirmed that the patient has been certified for services.  

## 2017-12-13 DIAGNOSIS — J301 Allergic rhinitis due to pollen: Secondary | ICD-10-CM | POA: Diagnosis not present

## 2017-12-13 DIAGNOSIS — J3089 Other allergic rhinitis: Secondary | ICD-10-CM | POA: Diagnosis not present

## 2017-12-13 DIAGNOSIS — J3081 Allergic rhinitis due to animal (cat) (dog) hair and dander: Secondary | ICD-10-CM | POA: Diagnosis not present

## 2017-12-17 DIAGNOSIS — F411 Generalized anxiety disorder: Secondary | ICD-10-CM | POA: Diagnosis not present

## 2017-12-18 ENCOUNTER — Encounter: Payer: Self-pay | Admitting: Podiatry

## 2017-12-18 NOTE — Progress Notes (Signed)
Subjective: Stacy [name] presents today with diabetes, diabetic neuropathy andpainful, discolored, thick toenails which interfere with daily activities and routine tasks. Pain is aggravated when wearing enclosed shoe gear. Pain is getting progressively worse and relieved with periodic professional debridement.   Stacy [name] also complains of painful hyperkeratotic lesions plantarmedial hallux IPJ b/l  Objective:  Vascular Examination: Capillary refill time immediate x 10 digits Dorsalis pedis pulses palpable b/l Posterior tibial pulses palpable b/l No digital hair x 10 digits Skin temperature WNL b/l  Dermatological Examination: Skin with normal turgor, texture and tone b/l Toenails 1-5 b/l discolored, thick, dystrophic with subungual debris and pain with palpation to nailbeds due to thickness of nails. Hyperkeratotic lesion noted plantarmedial hallux IPJ b/l  Musculoskeletal: Muscle strength 5/5 to all LE muscle groups  Neurological: Sensation diminished with 10 gram monofilament. Vibratory sensation diminished  Assessment: 1. Painful onychomycosis toenails 1-5 b/l 2. Callus b/l hallux medial IPJ 3. NIDDM with Diabetic neuropathy  Plan: 1. Continue diabetic foot care principles. Literature dispensed. 2. Toenails 1-5 b/l were debrided in length and girth without iatrogenic bleeding. 3. Hyperkeratotic lesion debrided b/l hallux IPJ 4. Patient to continue soft, supportive shoe gear 5. Patient to report any pedal injuries to medical professional  6. Follow up 3 months. Patient/POA to call should there be a concern in the interim.

## 2017-12-20 DIAGNOSIS — J301 Allergic rhinitis due to pollen: Secondary | ICD-10-CM | POA: Diagnosis not present

## 2017-12-20 DIAGNOSIS — J3089 Other allergic rhinitis: Secondary | ICD-10-CM | POA: Diagnosis not present

## 2017-12-20 DIAGNOSIS — J3081 Allergic rhinitis due to animal (cat) (dog) hair and dander: Secondary | ICD-10-CM | POA: Diagnosis not present

## 2017-12-23 DIAGNOSIS — F411 Generalized anxiety disorder: Secondary | ICD-10-CM | POA: Diagnosis not present

## 2017-12-27 DIAGNOSIS — H5213 Myopia, bilateral: Secondary | ICD-10-CM | POA: Diagnosis not present

## 2017-12-27 DIAGNOSIS — J3089 Other allergic rhinitis: Secondary | ICD-10-CM | POA: Diagnosis not present

## 2017-12-27 DIAGNOSIS — J3081 Allergic rhinitis due to animal (cat) (dog) hair and dander: Secondary | ICD-10-CM | POA: Diagnosis not present

## 2017-12-27 DIAGNOSIS — J301 Allergic rhinitis due to pollen: Secondary | ICD-10-CM | POA: Diagnosis not present

## 2017-12-27 DIAGNOSIS — H52223 Regular astigmatism, bilateral: Secondary | ICD-10-CM | POA: Diagnosis not present

## 2017-12-29 DIAGNOSIS — F411 Generalized anxiety disorder: Secondary | ICD-10-CM | POA: Diagnosis not present

## 2018-01-03 DIAGNOSIS — J3089 Other allergic rhinitis: Secondary | ICD-10-CM | POA: Diagnosis not present

## 2018-01-03 DIAGNOSIS — J3081 Allergic rhinitis due to animal (cat) (dog) hair and dander: Secondary | ICD-10-CM | POA: Diagnosis not present

## 2018-01-03 DIAGNOSIS — J301 Allergic rhinitis due to pollen: Secondary | ICD-10-CM | POA: Diagnosis not present

## 2018-01-06 ENCOUNTER — Ambulatory Visit (INDEPENDENT_AMBULATORY_CARE_PROVIDER_SITE_OTHER): Payer: Medicaid Other | Admitting: Specialist

## 2018-01-06 ENCOUNTER — Ambulatory Visit (INDEPENDENT_AMBULATORY_CARE_PROVIDER_SITE_OTHER): Payer: Medicaid Other

## 2018-01-06 ENCOUNTER — Telehealth (INDEPENDENT_AMBULATORY_CARE_PROVIDER_SITE_OTHER): Payer: Self-pay | Admitting: Specialist

## 2018-01-06 ENCOUNTER — Encounter (INDEPENDENT_AMBULATORY_CARE_PROVIDER_SITE_OTHER): Payer: Self-pay | Admitting: Specialist

## 2018-01-06 VITALS — BP 144/73 | HR 56 | Ht 66.0 in | Wt 180.0 lb

## 2018-01-06 DIAGNOSIS — M5136 Other intervertebral disc degeneration, lumbar region: Secondary | ICD-10-CM | POA: Diagnosis not present

## 2018-01-06 DIAGNOSIS — E119 Type 2 diabetes mellitus without complications: Secondary | ICD-10-CM

## 2018-01-06 DIAGNOSIS — Z96651 Presence of right artificial knee joint: Secondary | ICD-10-CM | POA: Diagnosis not present

## 2018-01-06 DIAGNOSIS — F411 Generalized anxiety disorder: Secondary | ICD-10-CM | POA: Diagnosis not present

## 2018-01-06 DIAGNOSIS — T8484XD Pain due to internal orthopedic prosthetic devices, implants and grafts, subsequent encounter: Secondary | ICD-10-CM

## 2018-01-06 MED ORDER — DICLOFENAC SODIUM 1 % TD GEL: 4.0000 g | Freq: Four times a day (QID) | TRANSDERMAL | 1 refills | Status: DC

## 2018-01-06 MED ORDER — CELECOXIB 200 MG PO CAPS: 200.0000 mg | ORAL_CAPSULE | Freq: Two times a day (BID) | ORAL | 3 refills | Status: DC

## 2018-01-06 MED ORDER — GABAPENTIN 300 MG PO CAPS: 300.0000 mg | ORAL_CAPSULE | Freq: Three times a day (TID) | ORAL | 3 refills | Status: DC

## 2018-01-06 NOTE — Telephone Encounter (Signed)
I will mail placard to her, pt is aware

## 2018-01-06 NOTE — Telephone Encounter (Signed)
Pt is requesting a handicapped  placcard

## 2018-01-06 NOTE — Progress Notes (Signed)
Office Visit Note   Patient: Stacy [name]           Date of Birth: [birth year]           MRN: [id number] Visit Date: 01/06/2018              Requested by: Marcine MatarJohnson, Deborah B, MD [address] [place], [place] [id number] PCP: Marcine MatarJohnson, Deborah B, MD   Assessment & Plan: Visit Diagnoses:  1. 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 Should avoid narcotics as they do not make your condition any better. Exercises for stretching and strengthening the legs are helpful. Avoid stairs and ladders due to risk of injury to the knee replacement. Pool walking, ymca or ywca.  Avoid bending, stooping and avoid lifting weights greater than 10 lbs. Avoid prolong standing and walking. Avoid frequent bending and stooping  No lifting greater than 10 lbs. May use ice or moist heat for pain. Weight loss is of benefit. Handicap license is approved.  Follow-Up Instructions: Return in about 3 months (around 04/07/2018).   Orders:  Orders Placed This Encounter  Procedures  . XR KNEE 3 VIEW RIGHT   No orders of the defined types were placed in this encounter.     Procedures: No procedures performed   Clinical Data: No additional findings.   Subjective: Chief Complaint  Patient presents with  . Lower Back - Follow-up  . Right Knee - Follow-up    57 year old female status post right TKR in 09/2015. Then lumbar laminectomy for righ L5-S1 microdiscectomy in May 2018. Previous bilateral partial Hemilaminectomies L2-3, L3-4 and L4-5 for lateral recess stenosis. Now with pain in the right posterior knee. Right knee hurts to stand and walk. She  Has a hard time bending the right knee without it hurting. She notes knee pain with lying down at night with the leg out straight.    Review of Systems   Objective: Vital Signs: BP (!) 144/73 (BP Location: Left Arm, Patient Position: Sitting)   Pulse (!)  56   Ht 5\' 6"  (1.676 m)   Wt 180 lb (81.6 kg)   BMI 29.05 kg/m   Physical Exam  Ortho Exam  Specialty Comments:  No specialty comments available.  Imaging: Xr Knee 3 View Right  Result Date: 01/06/2018 AP and Lateral and sunrise patellofemoral views show well seated implants with normal bone cement interfaces. No sign of loosening. Patella with slight lateral tilt but no bone on implant laterally. Noted. She has patella baja. No fracture or dislocation.     PMFS History: Patient Active Problem List   Diagnosis Date Noted  . Herniation of lumbar intervertebral disc with radiculopathy 10/02/2016    Priority: High    Class: Chronic  . Chondromalacia of both patellae 06/03/2015    Priority: High    Class: Chronic  . Moderate persistent asthma without complication 06/29/2017  . Environmental and seasonal allergies 06/29/2017  . Controlled type 2 diabetes mellitus with diabetic polyneuropathy, without long-term current use of insulin ([facility]) 06/29/2017  . Perennial allergic rhinitis 04/08/2017  . Sensorineural hearing loss (SNHL), bilateral 04/08/2017  . Chronic pansinusitis 03/25/2017  . Eustachian tube dysfunction, bilateral 03/25/2017  . Hearing difficulty of both ears 03/03/2017  . 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 Alopecia areata 08/19/2016  . Spinal stenosis, lumbar region, with neurogenic claudication 06/03/2015  . Tobacco use disorder  04/25/2015  . DJD (degenerative joint disease) of knee 01/04/2015  . Hemorrhoid 11/14/2014  . Gout of big toe 07/19/2014  . Essential hypertension 08/14/2013  . Gastroesophageal reflux disease without esophagitis 08/14/2013  . COPD (chronic obstructive pulmonary disease) ([facility]) 04/17/2011   Past Medical History:  Diagnosis Date  . Arthritis   . 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 Asthma   . COPD (chronic obstructive pulmonary disease) ([facility])   . Diabetes mellitus     Type II  . GERD (gastroesophageal reflux disease)    Pt on Protonix daily  . Glaucoma   . Gout   . Headache(784.0)    otc meds prn  . Hyperlipidemia   . Hypertension    Pt on lisinopril  . Irritable bowel syndrome 11/19/2010  . Neuropathy   . Pneumonia   . Restless legs   . Shortness of breath    occasional - uses breathing tx at home    Family History  Problem Relation Age of Onset  . Hypertension Father   . Cancer Father   . Heart disease Mother   . Asthma Son        had as a child  . Heart disease Sister   . Breast cancer Sister   . Hypertension Brother     Past Surgical History:  Procedure Laterality Date  . CHOLECYSTECTOMY    . COLONOSCOPY    . ENDOMETRIAL ABLATION  10/2010  . HERNIA REPAIR     umbicial hernia  . KNEE ARTHROSCOPY Left    06/07/2017 Dr. August Saucer [facility]  . KNEE CLOSED REDUCTION Right 12/06/2015   Procedure: CLOSED MANIPULATION RIGHT KNEE;  Surgeon: Kerrin Champagne, MD;  [facility];  Service: Orthopedics;  Laterality: Right;  . KNEE CLOSED REDUCTION Right 01/17/2016   Procedure: CLOSED MANIPULATION RIGHT KNEE;  Surgeon: Kerrin Champagne, MD;  Location: [facility];  Service: Orthopedics;  Laterality: Right;  . KNEE JOINT MANIPULATION Right 12/06/2015  . LUMBAR DISC SURGERY  06/03/2015   L 2  L3 L4 L5   . LUMBAR LAMINECTOMY/DECOMPRESSION MICRODISCECTOMY N/A 06/03/2015   Procedure: Bilateral lateral recess decompression L2-3, L3-4, L4-5;  Surgeon: Kerrin Champagne, MD;  Location: [facility];  Service: Orthopedics;  Laterality: N/A;  . LUMBAR LAMINECTOMY/DECOMPRESSION MICRODISCECTOMY N/A 10/02/2016   Procedure: Right L5-S1 Lateral Recess Decompression  microdiscectomy;  Surgeon: Kerrin Champagne, MD;  Location: [facility];  Service: Orthopedics;  Laterality: N/A;  . svd      x 2  . TOTAL KNEE ARTHROPLASTY Right 09/06/2015   Procedure: RIGHT TOTAL KNEE ARTHROPLASTY;  Surgeon: Kerrin Champagne, MD;  Location: [facility];  Service: Orthopedics;  Laterality: Right;  . TUBAL  LIGATION    . UPPER GASTROINTESTINAL ENDOSCOPY  04/28/11   Social History   Occupational History  . Occupation: unemployed    Associate Professor: UNEMPLOYED  Tobacco Use  . Smoking status: Current Some Day Smoker    Packs/day: 0.25    Years: 32.00    Pack years: 8.00    Types: Cigarettes  . Smokeless tobacco: Never Used  Substance and Sexual Activity  . Alcohol use: No  . Drug use: No  . Sexual activity: Yes    Birth control/protection: Surgical

## 2018-01-06 NOTE — Patient Instructions (Signed)
Best medications for pain are arthritis medications. Should avoid narcotics as they do not make your condition any better. Exercises for stretching and strengthening the legs are helpful. Avoid stairs and ladders due to risk of injury to the knee replacement. Pool walking, ymca or ywca.  Avoid bending, stooping and avoid lifting weights greater than 10 lbs. Avoid prolong standing and walking. Avoid frequent bending and stooping  No lifting greater than 10 lbs. May use ice or moist heat for pain. Weight loss is of benefit. Handicap license is approved.

## 2018-01-10 ENCOUNTER — Other Ambulatory Visit: Payer: Self-pay | Admitting: Internal Medicine

## 2018-01-10 DIAGNOSIS — E1142 Type 2 diabetes mellitus with diabetic polyneuropathy: Secondary | ICD-10-CM

## 2018-01-11 ENCOUNTER — Other Ambulatory Visit: Payer: Self-pay | Admitting: Internal Medicine

## 2018-01-11 DIAGNOSIS — F17200 Nicotine dependence, unspecified, uncomplicated: Secondary | ICD-10-CM

## 2018-01-11 DIAGNOSIS — J4540 Moderate persistent asthma, uncomplicated: Secondary | ICD-10-CM

## 2018-01-11 DIAGNOSIS — J441 Chronic obstructive pulmonary disease with (acute) exacerbation: Secondary | ICD-10-CM

## 2018-01-11 DIAGNOSIS — J449 Chronic obstructive pulmonary disease, unspecified: Secondary | ICD-10-CM

## 2018-01-12 DIAGNOSIS — J3089 Other allergic rhinitis: Secondary | ICD-10-CM | POA: Diagnosis not present

## 2018-01-12 DIAGNOSIS — J3081 Allergic rhinitis due to animal (cat) (dog) hair and dander: Secondary | ICD-10-CM | POA: Diagnosis not present

## 2018-01-12 DIAGNOSIS — J301 Allergic rhinitis due to pollen: Secondary | ICD-10-CM | POA: Diagnosis not present

## 2018-01-13 DIAGNOSIS — F411 Generalized anxiety disorder: Secondary | ICD-10-CM | POA: Diagnosis not present

## 2018-01-17 DIAGNOSIS — J3081 Allergic rhinitis due to animal (cat) (dog) hair and dander: Secondary | ICD-10-CM | POA: Diagnosis not present

## 2018-01-17 DIAGNOSIS — J3089 Other allergic rhinitis: Secondary | ICD-10-CM | POA: Diagnosis not present

## 2018-01-17 DIAGNOSIS — J301 Allergic rhinitis due to pollen: Secondary | ICD-10-CM | POA: Diagnosis not present

## 2018-01-20 DIAGNOSIS — F411 Generalized anxiety disorder: Secondary | ICD-10-CM | POA: Diagnosis not present

## 2018-01-28 DIAGNOSIS — F411 Generalized anxiety disorder: Secondary | ICD-10-CM | POA: Diagnosis not present

## 2018-01-31 DIAGNOSIS — J4540 Moderate persistent asthma, uncomplicated: Secondary | ICD-10-CM | POA: Diagnosis not present

## 2018-01-31 DIAGNOSIS — J3089 Other allergic rhinitis: Secondary | ICD-10-CM | POA: Diagnosis not present

## 2018-01-31 DIAGNOSIS — J449 Chronic obstructive pulmonary disease, unspecified: Secondary | ICD-10-CM | POA: Diagnosis not present

## 2018-01-31 DIAGNOSIS — J3081 Allergic rhinitis due to animal (cat) (dog) hair and dander: Secondary | ICD-10-CM | POA: Diagnosis not present

## 2018-01-31 DIAGNOSIS — J301 Allergic rhinitis due to pollen: Secondary | ICD-10-CM | POA: Diagnosis not present

## 2018-02-03 DIAGNOSIS — F411 Generalized anxiety disorder: Secondary | ICD-10-CM | POA: Diagnosis not present

## 2018-02-09 ENCOUNTER — Other Ambulatory Visit: Payer: Self-pay | Admitting: Internal Medicine

## 2018-02-09 ENCOUNTER — Other Ambulatory Visit (INDEPENDENT_AMBULATORY_CARE_PROVIDER_SITE_OTHER): Payer: Self-pay | Admitting: Specialist

## 2018-02-09 DIAGNOSIS — J301 Allergic rhinitis due to pollen: Secondary | ICD-10-CM | POA: Diagnosis not present

## 2018-02-09 DIAGNOSIS — E119 Type 2 diabetes mellitus without complications: Secondary | ICD-10-CM

## 2018-02-09 DIAGNOSIS — K219 Gastro-esophageal reflux disease without esophagitis: Secondary | ICD-10-CM

## 2018-02-09 DIAGNOSIS — J3081 Allergic rhinitis due to animal (cat) (dog) hair and dander: Secondary | ICD-10-CM | POA: Diagnosis not present

## 2018-02-09 DIAGNOSIS — I10 Essential (primary) hypertension: Secondary | ICD-10-CM

## 2018-02-09 DIAGNOSIS — J3089 Other allergic rhinitis: Secondary | ICD-10-CM | POA: Diagnosis not present

## 2018-02-09 MED ORDER — DICLOFENAC SODIUM 1 % TD GEL: 4.0000 g | Freq: Four times a day (QID) | TRANSDERMAL | 1 refills | Status: DC

## 2018-02-09 NOTE — Telephone Encounter (Signed)
Rx refill Voltaren Gel [facility] [phone]

## 2018-02-10 DIAGNOSIS — F411 Generalized anxiety disorder: Secondary | ICD-10-CM | POA: Diagnosis not present

## 2018-02-11 ENCOUNTER — Encounter: Payer: Self-pay | Admitting: Internal Medicine

## 2018-02-11 ENCOUNTER — Ambulatory Visit: Payer: Medicaid Other | Attending: Internal Medicine | Admitting: Internal Medicine

## 2018-02-11 VITALS — BP 102/60 | HR 87 | Temp 98.5°F | Resp 16 | Ht 66.0 in | Wt 179.8 lb

## 2018-02-11 DIAGNOSIS — J449 Chronic obstructive pulmonary disease, unspecified: Secondary | ICD-10-CM | POA: Diagnosis not present

## 2018-02-11 DIAGNOSIS — Z7984 Long term (current) use of oral hypoglycemic drugs: Secondary | ICD-10-CM | POA: Insufficient documentation

## 2018-02-11 DIAGNOSIS — M1710 Unilateral primary osteoarthritis, unspecified knee: Secondary | ICD-10-CM | POA: Diagnosis not present

## 2018-02-11 DIAGNOSIS — Z825 Family history of asthma and other chronic lower respiratory diseases: Secondary | ICD-10-CM | POA: Insufficient documentation

## 2018-02-11 DIAGNOSIS — Z9049 Acquired absence of other specified parts of digestive tract: Secondary | ICD-10-CM | POA: Insufficient documentation

## 2018-02-11 DIAGNOSIS — M109 Gout, unspecified: Secondary | ICD-10-CM | POA: Insufficient documentation

## 2018-02-11 DIAGNOSIS — F17200 Nicotine dependence, unspecified, uncomplicated: Secondary | ICD-10-CM

## 2018-02-11 DIAGNOSIS — Z7951 Long term (current) use of inhaled steroids: Secondary | ICD-10-CM | POA: Diagnosis not present

## 2018-02-11 DIAGNOSIS — J4540 Moderate persistent asthma, uncomplicated: Secondary | ICD-10-CM | POA: Insufficient documentation

## 2018-02-11 DIAGNOSIS — F17210 Nicotine dependence, cigarettes, uncomplicated: Secondary | ICD-10-CM | POA: Diagnosis not present

## 2018-02-11 DIAGNOSIS — I10 Essential (primary) hypertension: Secondary | ICD-10-CM | POA: Diagnosis not present

## 2018-02-11 DIAGNOSIS — E1142 Type 2 diabetes mellitus with diabetic polyneuropathy: Secondary | ICD-10-CM | POA: Diagnosis present

## 2018-02-11 DIAGNOSIS — Z886 Allergy status to analgesic agent status: Secondary | ICD-10-CM | POA: Insufficient documentation

## 2018-02-11 DIAGNOSIS — M5116 Intervertebral disc disorders with radiculopathy, lumbar region: Secondary | ICD-10-CM | POA: Diagnosis not present

## 2018-02-11 DIAGNOSIS — K219 Gastro-esophageal reflux disease without esophagitis: Secondary | ICD-10-CM | POA: Insufficient documentation

## 2018-02-11 DIAGNOSIS — Z96651 Presence of right artificial knee joint: Secondary | ICD-10-CM | POA: Insufficient documentation

## 2018-02-11 DIAGNOSIS — Z8249 Family history of ischemic heart disease and other diseases of the circulatory system: Secondary | ICD-10-CM | POA: Insufficient documentation

## 2018-02-11 DIAGNOSIS — D649 Anemia, unspecified: Secondary | ICD-10-CM | POA: Diagnosis not present

## 2018-02-11 DIAGNOSIS — M48062 Spinal stenosis, lumbar region with neurogenic claudication: Secondary | ICD-10-CM | POA: Insufficient documentation

## 2018-02-11 DIAGNOSIS — M62838 Other muscle spasm: Secondary | ICD-10-CM | POA: Insufficient documentation

## 2018-02-11 DIAGNOSIS — Z888 Allergy status to other drugs, medicaments and biological substances status: Secondary | ICD-10-CM | POA: Insufficient documentation

## 2018-02-11 DIAGNOSIS — Z79899 Other long term (current) drug therapy: Secondary | ICD-10-CM | POA: Diagnosis not present

## 2018-02-11 LAB — POCT GLYCOSYLATED HEMOGLOBIN (HGB A1C): HbA1c, POC (prediabetic range): 6.5 % — AB (ref 5.7–6.4)

## 2018-02-11 LAB — GLUCOSE, POCT (MANUAL RESULT ENTRY): POC Glucose: 72 mg/dl (ref 70–99)

## 2018-02-11 MED ORDER — METHOCARBAMOL 500 MG PO TABS: 500.0000 mg | ORAL_TABLET | Freq: Four times a day (QID) | ORAL | 0 refills | Status: DC | PRN

## 2018-02-11 MED ORDER — POTASSIUM CHLORIDE ER 20 MEQ PO TBCR: 20.0000 meq | EXTENDED_RELEASE_TABLET | Freq: Every day | ORAL | 6 refills | Status: DC

## 2018-02-11 NOTE — Progress Notes (Signed)
Pt states she has been having discomfort in the back of b/l legs

## 2018-02-11 NOTE — Patient Instructions (Signed)
Please ask your pharmacy about the Incruse inhaler.  I sent a prescription in October with 11 refills.

## 2018-02-11 NOTE — Telephone Encounter (Signed)
Patient called stating that umeclidinium bromide (INCRUSE ELLIPTA) 62.5 MCG/INH AEPB was prescribed back in Oct. And she went to pick up her meds and was told that the inhaler is not covered by Medicaid. Pt. Would like to be prescribed another medication. Please f/u

## 2018-02-11 NOTE — Progress Notes (Signed)
Patient ID: Stacy [name] [facility], female    DOB: 12-10-1960  MRN: [id number]  CC: Hypertension and Diabetes   Subjective: Stacy [name] is a 58 y.o. female who presents for chronic ds management. Her concerns today include:  Pt with hx of HTN, DM with neuropathy, tobacco dep, HL, COPD, RLS, gout, tob dep, anemia, recurrent sinusitis, receiving allergy shots from Dr. Faith Rogue   C/o spasms in RT calf and feet x few wks.   Mainly at nights.  Takes Tsp mustard and Robaxin.  Takes about 30 mins to resolve. Out of K+ supplement for a few mths.  DIABETES TYPE 2 Last A1C:   Results for orders placed or performed in visit on 02/11/18  POCT glucose (manual entry)  Result Value Ref Range   POC Glucose 72 70 - 99 mg/dl  POCT glycosylated hemoglobin (Hb A1C)  Result Value Ref Range   Hemoglobin A1C     HbA1c POC (<> result, manual entry)     HbA1c, POC (prediabetic range) 6.5 (A) 5.7 - 6.4 %   HbA1c, POC (controlled diabetic range)      Med Adherence:  _0  Yes    _1  No Medication side effects:  _2  Yes    _3  No Home Monitoring?  _4  Yes, before BF    _5  No Home glucose results range: 100-120.  70s occasionally Diet Adherence: _6  Yes, states she eats one meal a day. Snakes on fruits     Exercise: _7  Yes, try to stay active.  If too cold outside, she moves around in house.  Has a phone app that tracks her steps     Hypoglycemic episodes?: _8  Yes, occasional    _9  No Numbness of the feet? _10  Yes.  "A little bit."  Has appt with podiatrist later this mth    _11  No Retinopathy hx? _12  Yes    _13  No Last eye exam: 04/2017 Comments: Endorses numbness on the soles of both feet.  HYPERTENSION Currently taking: see medication list Med Adherence: _14  Yes    _15  No Medication side effects: _16  Yes    _17  No Adherence with salt restriction: _18  Yes    _19  No Home Monitoring?: _20  Yes    _21  No, has a device but has   Monitoring Frequency: _22  Yes    _23  No Home BP results range: _24  Yes     _25  No SOB? _26  Yes    _27  No Chest Pain?: _28  Yes    _29  No Leg swelling?: _30  Yes    _31  No Headaches?: _32  Yes    _33  No Dizziness? _34  Yes    _35  No Comments:   COPD: Uses Symbicort and her nebulizer treatments twice a day.  She is also supposed to be on Incruse inhaler but states that her pharmacy has not delivered it to her.  I informed her that I filled it on last visit with 11 refills.  She will call her pharmacy about this. She has almost quit smoking.  She continues to use the nicotine patches.  She continues to see an allergist.  States that she was told she is allergic to fish and shrimp. Patient Active Problem List   Diagnosis Date Noted  . Moderate persistent asthma without complication 13/24/4010  . Environmental and seasonal allergies 06/29/2017  . Controlled type 2 diabetes mellitus with diabetic polyneuropathy, without long-term current use of insulin ([place]) 06/29/2017  . Perennial allergic rhinitis 04/08/2017  . Sensorineural hearing loss (SNHL), bilateral 04/08/2017  .  Chronic pansinusitis 03/25/2017  . Eustachian tube dysfunction, bilateral 03/25/2017  . Hearing difficulty of both ears 03/03/2017  . 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 Lichen planopilaris 87/86/7672  . Herniation of lumbar intervertebral disc with radiculopathy 10/02/2016    Class: Chronic  . Alopecia areata 08/19/2016  . Chondromalacia of both patellae 06/03/2015    Class: Chronic  . Spinal stenosis, lumbar region, with neurogenic claudication 06/03/2015  . Tobacco use disorder 04/25/2015  . DJD (degenerative joint disease) of knee 01/04/2015  . Hemorrhoid 11/14/2014  . Gout of big toe 07/19/2014  . Essential hypertension 08/14/2013  . Gastroesophageal reflux disease without esophagitis 08/14/2013  . COPD (chronic obstructive pulmonary disease) ([place]) 04/17/2011     Current Outpatient Medications on File Prior to Visit  Medication Sig Dispense Refill  . ACCU-CHEK AVIVA PLUS test strip  USE 3 TIMES A DAY AS DIRECTED BY PHYSICIAN 100 each 12  . ACCU-CHEK SOFTCLIX LANCETS lancets USE AS INSTRUCTED 3 TIMES A DAY 100 each 12  . acetaminophen-codeine (TYLENOL #3) 300-30 MG tablet TAKE 1 TABLET BY MOUTH EVERY 4 HOURS AS NEEDED    . albuterol (PROVENTIL) (2.5 MG/3ML) 0.083% nebulizer solution USE 1 VIAL VIA NEBULIZER EVERY 6 HOURS AS NEEDED FOR WHEEZING 180 mL 1  . allopurinol (ZYLOPRIM) 100 MG tablet Take 1 tablet (100 mg total) by mouth daily. 90 tablet 3  . Blood Glucose Monitoring Suppl (ACCU-CHEK AVIVA PLUS) w/Device KIT 1 each by Does not apply route 3 (three) times daily. 1 kit 0  . budesonide-formoterol (SYMBICORT) 80-4.5 MCG/ACT inhaler INHALE TWO PUFFS BY MOUTH TWICE A DAY 1 Inhaler 2  . celecoxib (CELEBREX) 200 MG capsule Take 1 capsule (200 mg total) by mouth 2 (two) times daily. 180 capsule 3  . cholecalciferol (VITAMIN D) 1000 units tablet Take 1,000 Units by mouth daily.    . diclofenac sodium (VOLTAREN) 1 % GEL Apply 4 g topically 4 (four) times daily. 5 Tube 3  . diclofenac sodium (VOLTAREN) 1 % GEL Apply 4 g topically 4 (four) times daily. 3 Tube 1  . dicyclomine (BENTYL) 20 MG tablet TK 1 T PO  QID BEFORE MEALS AND HS FOR 10 DAYS    . EPINEPHrine 0.3 mg/0.3 mL IJ SOAJ injection 0.3 mg IM x 1 PRN for allergic reaction 1 Device 1  . fluticasone (FLONASE) 50 MCG/ACT nasal spray Place 1 spray into both nostrils daily. 16 g 6  . gabapentin (NEURONTIN) 300 MG capsule Take 1 capsule (300 mg total) by mouth 3 (three) times daily. 270 capsule 3  . glimepiride (AMARYL) 2 MG tablet TAKE 1 TABLET (2 MG TOTAL) BY MOUTH DAILY BEFORE BREAKFAST. 30 tablet 2  . glucose blood test strip USE AS DIRECTED 3 TIMES A DAY    . hydrochlorothiazide (HYDRODIURIL) 25 MG tablet TAKE 1 TABLET (25 MG TOTAL) BY MOUTH DAILY. 90 tablet 3  . hydrOXYzine (ATARAX/VISTARIL) 10 MG tablet TAKE 1 TABLET IN THE MORNING AND NOON AND 2 TABLETS IN THE EVENING AS NEEDED 120 tablet 2  . Lancets (ACCU-CHEK SOFT  TOUCH) lancets Use as instructed 100 each 12  . levofloxacin (LEVAQUIN) 750 MG tablet TK 1 T PO  D FOPR 10 DAYS    . loratadine (CLARITIN) 10 MG tablet Take 1 tablet (10 mg total) by mouth daily. 30 tablet 11  . Melatonin 3 MG TABS Take 1 tablet (3 mg total) by mouth at bedtime as needed. 30 tablet 3  . MITIGARE 0.6 MG CAPS Take 1 capsule by  mouth daily. 30 capsule 3  . Multiple Vitamin (MULTIVITAMIN WITH MINERALS) TABS tablet Take 1 tablet by mouth daily.    . nicotine (NICODERM CQ - DOSED IN MG/24 HOURS) 14 mg/24hr patch PLACE 1 PATCH ONTO THE SKIN DAILY 30 patch 0  . pantoprazole (PROTONIX) 40 MG tablet TAKE 1 TABLET (40 MG TOTAL) BY MOUTH DAILY. 30 tablet 2  . promethazine (PHENERGAN) 12.5 MG tablet Take 1 tablet (12.5 mg total) by mouth every 8 (eight) hours as needed for nausea or vomiting. 15 tablet 0  . simvastatin (ZOCOR) 20 MG tablet TAKE 1 TABLET (20 MG TOTAL) BY MOUTH AT BEDTIME. 30 tablet 2  . sucralfate (CARAFATE) 1 g tablet Take 1 g by mouth 4 (four) times daily -  with meals and at bedtime.    Marland Kitchen umeclidinium bromide (INCRUSE ELLIPTA) 62.5 MCG/INH AEPB Inhale 1 puff into the lungs daily. 1 each 11  . valsartan-hydrochlorothiazide (DIOVAN-HCT) 160-12.5 MG tablet TAKE 1 TABLET BY MOUTH DAILY. 30 tablet 2  . Vitamins/Minerals TABS Take by mouth.    . [DISCONTINUED] Fluticasone-Salmeterol (ADVAIR) 500-50 MCG/DOSE AEPB Inhale 1 puff into the lungs every 12 (twelve) hours.      . [DISCONTINUED] lisinopril (PRINIVIL,ZESTRIL) 40 MG tablet Take 40 mg by mouth daily.       No current facility-administered medications on file prior to visit.     Allergies  Allergen Reactions  . Other Shortness Of Breath    UNSPECIFIED AGENTS Allergic to perfumes and cleaning products  . Ace Inhibitors Cough and Other (See Comments)       . Aspirin Nausea Only    Social History   Socioeconomic History  . Marital status: Married    Spouse name: Not on file  . Number of children: 2  . Years of  education: Not on file  . Highest education level: Not on file  Occupational History  . Occupation: unemployed    Fish farm manager: UNEMPLOYED  Social Needs  . Financial resource strain: Not on file  . Food insecurity:    Worry: Not on file    Inability: Not on file  . Transportation needs:    Medical: Not on file    Non-medical: Not on file  Tobacco Use  . Smoking status: Current Some Day Smoker    Packs/day: 0.25    Years: 32.00    Pack years: 8.00    Types: Cigarettes  . Smokeless tobacco: Never Used  Substance and Sexual Activity  . Alcohol use: No  . Drug use: No  . Sexual activity: Yes    Birth control/protection: Surgical  Lifestyle  . Physical activity:    Days per week: Not on file    Minutes per session: Not on file  . Stress: Not on file  Relationships  . Social connections:    Talks on phone: Not on file    Gets together: Not on file    Attends religious service: Not on file    Active member of club or organization: Not on file    Attends meetings of clubs or organizations: Not on file    Relationship status: Not on file  . Intimate partner violence:    Fear of current or ex partner: Not on file    Emotionally abused: Not on file    Physically abused: Not on file    Forced sexual activity: Not on file  Other Topics Concern  . Not on file  Social History Narrative  . Not on file  Family History  Problem Relation Age of Onset  . Hypertension Father   . Cancer Father   . Heart disease Mother   . Asthma Son        had as a child  . Heart disease Sister   . Breast cancer Sister   . Hypertension Brother     Past Surgical History:  Procedure Laterality Date  . CHOLECYSTECTOMY    . COLONOSCOPY    . ENDOMETRIAL ABLATION  10/2010  . HERNIA REPAIR     umbicial hernia  . KNEE ARTHROSCOPY Left    06/07/2017 Dr. Marlou Sa of [facility]  . KNEE CLOSED REDUCTION Right 12/06/2015   Procedure: CLOSED MANIPULATION RIGHT KNEE;  Surgeon: [name], MD;   Location: [place];  Service: Orthopedics;  Laterality: Right;  . KNEE CLOSED REDUCTION Right 01/17/2016   Procedure: CLOSED MANIPULATION RIGHT KNEE;  Surgeon: [name], MD;  Location: [place];  Service: Orthopedics;  Laterality: Right;  . KNEE JOINT MANIPULATION Right 12/06/2015  . LUMBAR DISC SURGERY  06/03/2015   L 2  L3 L4 L5   . LUMBAR LAMINECTOMY/DECOMPRESSION MICRODISCECTOMY N/A 06/03/2015   Procedure: Bilateral lateral recess decompression L2-3, L3-4, L4-5;  Surgeon: [name], MD;  Location: [place];  Service: Orthopedics;  Laterality: N/A;  . LUMBAR LAMINECTOMY/DECOMPRESSION MICRODISCECTOMY N/A 10/02/2016   Procedure: Right L5-S1 Lateral Recess Decompression  microdiscectomy;  Surgeon: [name], MD;  Location: [place];  Service: Orthopedics;  Laterality: N/A;  . svd      x 2  . TOTAL KNEE ARTHROPLASTY Right 09/06/2015   Procedure: RIGHT TOTAL KNEE ARTHROPLASTY;  Surgeon: [name], MD;  Location: [place];  Service: Orthopedics;  Laterality: Right;  . TUBAL LIGATION    . UPPER GASTROINTESTINAL ENDOSCOPY  04/28/11    ROS: Review of Systems Negative except as above PHYSICAL EXAM: BP 102/60   Pulse 87   Temp 98.5 F (36.9 C) (Oral)   Resp 16   Ht '5\' 6"'$  (1.676 m)   Wt 179 lb 12.8 oz (81.6 kg)   SpO2 96%   BMI 29.02 kg/m   Wt Readings from Last 3 Encounters:  02/11/18 179 lb 12.8 oz (81.6 kg)  01/06/18 180 lb (81.6 kg)  12/06/17 179 lb 12.8 oz (81.6 kg)   Physical Exam  General appearance - alert, well appearing, and in no distress Mental status - normal mood, behavior, speech, dress, motor activity, and thought processes Mouth - mucous membranes moist, pharynx normal without lesions Neck - supple, no significant adenopathy Chest - clear to auscultation, no wheezes, rales or rhonchi, symmetric air entry Heart - normal rate, regular rhythm, normal S1, S2, no murmurs, rubs, clicks or gallops Extremities - peripheral pulses normal, no pedal edema, no clubbing or  cyanosis Diabetic Foot Exam - Simple   Simple Foot Form Visual Inspection See comments:  Yes Sensation Testing See comments:  Yes Pulse Check Posterior Tibialis and Dorsalis pulse intact bilaterally:  Yes Comments Toenails are thickened and discolored.  Patient with decreased sensation on leap exam on the soles of both feet.    ASSESSMENT AND PLAN: 1. Controlled type 2 diabetes mellitus with diabetic polyneuropathy, without long-term current use of insulin ([place]) Commended her on good control.  Continue oral medication. - POCT glucose (manual entry) - POCT glycosylated hemoglobin (Hb A1C)  2. Chronic obstructive pulmonary disease, unspecified COPD type ([place]) Stable.  Continue Symbicort.  I have told her to requests refill on Incruse inhaler from  her pharmacy.  Continue to encourage smoking cessation.  She is almost there.  Advised to set a quit date.  3. Essential hypertension At goal.  Continue current medications to include HCTZ and Diovan/HCTZ.  Continue to limit salt in the foods. - Potassium Chloride ER 20 MEQ TBCR; Take 20 mEq by mouth daily.  Dispense: 30 tablet; Refill: 6 - Basic Metabolic Panel  4. Tobacco use disorder See #2 above.  5. Muscle spasm Recheck potassium level since she has been out of potassium.  Refill given on potassium.  If potassium level is okay then we should consider changing the simvastatin to Crestor or lovastatin - methocarbamol (ROBAXIN) 500 MG tablet; Take 1 tablet (500 mg total) by mouth every 6 (six) hours as needed for muscle spasms.  Dispense: 60 tablet; Refill: 0    Patient was given the opportunity to ask questions.  Patient verbalized understanding of the plan and was able to repeat key elements of the plan.   Orders Placed This Encounter  Procedures  . Basic Metabolic Panel  . POCT glucose (manual entry)  . POCT glycosylated hemoglobin (Hb A1C)     Requested Prescriptions   Signed Prescriptions Disp Refills  . Potassium  Chloride ER 20 MEQ TBCR 30 tablet 6    Sig: Take 20 mEq by mouth daily.  . methocarbamol (ROBAXIN) 500 MG tablet 60 tablet 0    Sig: Take 1 tablet (500 mg total) by mouth every 6 (six) hours as needed for muscle spasms.    Return in about 3 months (around 05/13/2018).  Karle Plumber, MD, FACP

## 2018-02-12 LAB — BASIC METABOLIC PANEL
BUN/Creatinine Ratio: 13 (ref 9–23)
BUN: 12 mg/dL (ref 6–24)
CO2: 23 mmol/L (ref 20–29)
Calcium: 9.8 mg/dL (ref 8.7–10.2)
Chloride: 95 mmol/L — ABNORMAL LOW (ref 96–106)
Creatinine, Ser: 0.94 mg/dL (ref 0.57–1.00)
GFR calc Af Amer: 77 mL/min/{1.73_m2} (ref >59)
GFR calc non Af Amer: 67 mL/min/{1.73_m2} (ref >59)
Glucose: 110 mg/dL — ABNORMAL HIGH (ref 65–99)
Potassium: 3.6 mmol/L (ref 3.5–5.2)
Sodium: 138 mmol/L (ref 134–144)

## 2018-02-14 DIAGNOSIS — J3089 Other allergic rhinitis: Secondary | ICD-10-CM | POA: Diagnosis not present

## 2018-02-14 DIAGNOSIS — J3081 Allergic rhinitis due to animal (cat) (dog) hair and dander: Secondary | ICD-10-CM | POA: Diagnosis not present

## 2018-02-14 DIAGNOSIS — J301 Allergic rhinitis due to pollen: Secondary | ICD-10-CM | POA: Diagnosis not present

## 2018-02-14 NOTE — Telephone Encounter (Signed)
Will forward to [place] and Dr. Laural Benes

## 2018-02-16 ENCOUNTER — Telehealth: Payer: Self-pay | Admitting: Internal Medicine

## 2018-02-16 MED ORDER — TIOTROPIUM BROMIDE MONOHYDRATE 18 MCG IN CAPS: 18.0000 ug | ORAL_CAPSULE | Freq: Every day | RESPIRATORY_TRACT | 12 refills | Status: DC

## 2018-02-16 NOTE — Telephone Encounter (Signed)
-----   Message from Lou Cal, CPhT sent at 02/14/2018  1:24 PM EST ----- This patient is covered by Medicaid, both Spiriva HandiHaler and Spiriva Respimat are covered.  ----- Message ----- From: Marcine Matar, MD Sent: 02/14/2018  12:55 PM EST To: Lou Cal, CPhT  Margaret: I received a message with patient's insurance does not cover Incruse inhaler.  Is there a way we can find out whether Spiriva is covered?  If so please let me know so that I can change to Spiriva.

## 2018-02-17 DIAGNOSIS — F411 Generalized anxiety disorder: Secondary | ICD-10-CM | POA: Diagnosis not present

## 2018-02-21 DIAGNOSIS — J3089 Other allergic rhinitis: Secondary | ICD-10-CM | POA: Diagnosis not present

## 2018-02-21 DIAGNOSIS — J301 Allergic rhinitis due to pollen: Secondary | ICD-10-CM | POA: Diagnosis not present

## 2018-02-21 DIAGNOSIS — J3081 Allergic rhinitis due to animal (cat) (dog) hair and dander: Secondary | ICD-10-CM | POA: Diagnosis not present

## 2018-02-24 DIAGNOSIS — F411 Generalized anxiety disorder: Secondary | ICD-10-CM | POA: Diagnosis not present

## 2018-02-25 ENCOUNTER — Telehealth: Payer: Self-pay | Admitting: *Deleted

## 2018-02-25 ENCOUNTER — Ambulatory Visit: Payer: Medicaid Other | Admitting: Podiatry

## 2018-02-25 DIAGNOSIS — E1142 Type 2 diabetes mellitus with diabetic polyneuropathy: Secondary | ICD-10-CM

## 2018-02-25 DIAGNOSIS — M79675 Pain in left toe(s): Secondary | ICD-10-CM | POA: Diagnosis not present

## 2018-02-25 DIAGNOSIS — M79676 Pain in unspecified toe(s): Secondary | ICD-10-CM

## 2018-02-25 DIAGNOSIS — I739 Peripheral vascular disease, unspecified: Secondary | ICD-10-CM

## 2018-02-25 DIAGNOSIS — B351 Tinea unguium: Secondary | ICD-10-CM | POA: Diagnosis not present

## 2018-02-25 DIAGNOSIS — J3081 Allergic rhinitis due to animal (cat) (dog) hair and dander: Secondary | ICD-10-CM | POA: Diagnosis not present

## 2018-02-25 DIAGNOSIS — M79674 Pain in right toe(s): Principal | ICD-10-CM

## 2018-02-25 DIAGNOSIS — L84 Corns and callosities: Secondary | ICD-10-CM

## 2018-02-25 DIAGNOSIS — J3089 Other allergic rhinitis: Secondary | ICD-10-CM | POA: Diagnosis not present

## 2018-02-25 DIAGNOSIS — R0989 Other specified symptoms and signs involving the circulatory and respiratory systems: Secondary | ICD-10-CM

## 2018-02-25 DIAGNOSIS — J301 Allergic rhinitis due to pollen: Secondary | ICD-10-CM | POA: Diagnosis not present

## 2018-02-25 NOTE — Patient Instructions (Signed)

## 2018-02-25 NOTE — Telephone Encounter (Signed)
[facility] REQUIRED CLINICALS FOR PRE-CERT EVALUATION.

## 2018-02-28 ENCOUNTER — Encounter: Payer: Self-pay | Admitting: Podiatry

## 2018-02-28 NOTE — Progress Notes (Signed)
Subjective: Stacy [name] presents with diabetes, diabetic neuropathy and cc of painful, discolored, thick toenails and painful calluses which interfere with activities of daily living. Pain is aggravated when wearing enclosed shoe gear. Pain is relieved with periodic professional debridement.  [name] complains of b/l thigh cramping right >left when in bed. She states this has been going on for quite some time. She states she cannot walk a full block without thigh pain. She points to her inner thigh area stating those are the muscles which cramp. She also complains of bilateral calf cramping as well.  [name], MD is her PCP and last visit was    Current Outpatient Medications:  .  ACCU-CHEK AVIVA PLUS test strip, USE 3 TIMES A DAY AS DIRECTED BY PHYSICIAN, Disp: 100 each, Rfl: 12 .  ACCU-CHEK SOFTCLIX LANCETS lancets, USE AS INSTRUCTED 3 TIMES A DAY, Disp: 100 each, Rfl: 12 .  acetaminophen-codeine (TYLENOL #3) 300-30 MG tablet, TAKE 1 TABLET BY MOUTH EVERY 4 HOURS AS NEEDED, Disp: , Rfl:  .  albuterol (PROVENTIL) (2.5 MG/3ML) 0.083% nebulizer solution, USE 1 VIAL VIA NEBULIZER EVERY 6 HOURS AS NEEDED FOR WHEEZING, Disp: 180 mL, Rfl: 1 .  allopurinol (ZYLOPRIM) 100 MG tablet, Take 1 tablet (100 mg total) by mouth daily., Disp: 90 tablet, Rfl: 3 .  Blood Glucose Monitoring Suppl (ACCU-CHEK AVIVA PLUS) w/Device KIT, 1 each by Does not apply route 3 (three) times daily., Disp: 1 kit, Rfl: 0 .  budesonide-formoterol (SYMBICORT) 80-4.5 MCG/ACT inhaler, INHALE TWO PUFFS BY MOUTH TWICE A DAY, Disp: 1 Inhaler, Rfl: 2 .  celecoxib (CELEBREX) 200 MG capsule, Take 1 capsule (200 mg total) by mouth 2 (two) times daily., Disp: 180 capsule, Rfl: 3 .  cholecalciferol (VITAMIN D) 1000 units tablet, Take 1,000 Units by mouth daily., Disp: , Rfl:  .  diclofenac sodium (VOLTAREN) 1 % GEL, Apply 4 g topically 4 (four) times daily., Disp: 5 Tube, Rfl: 3 .  diclofenac sodium (VOLTAREN) 1 % GEL,  Apply 4 g topically 4 (four) times daily., Disp: 3 Tube, Rfl: 1 .  dicyclomine (BENTYL) 20 MG tablet, TK 1 T PO  QID BEFORE MEALS AND HS FOR 10 DAYS, Disp: , Rfl:  .  EPINEPHrine 0.3 mg/0.3 mL IJ SOAJ injection, 0.3 mg IM x 1 PRN for allergic reaction, Disp: 1 Device, Rfl: 1 .  fluticasone (FLONASE) 50 MCG/ACT nasal spray, Place 1 spray into both nostrils daily., Disp: 16 g, Rfl: 6 .  gabapentin (NEURONTIN) 300 MG capsule, Take 1 capsule (300 mg total) by mouth 3 (three) times daily., Disp: 270 capsule, Rfl: 3 .  glimepiride (AMARYL) 2 MG tablet, TAKE 1 TABLET (2 MG TOTAL) BY MOUTH DAILY BEFORE BREAKFAST., Disp: 30 tablet, Rfl: 2 .  glucose blood test strip, USE AS DIRECTED 3 TIMES A DAY, Disp: , Rfl:  .  hydrochlorothiazide (HYDRODIURIL) 25 MG tablet, TAKE 1 TABLET (25 MG TOTAL) BY MOUTH DAILY., Disp: 90 tablet, Rfl: 3 .  hydrOXYzine (ATARAX/VISTARIL) 10 MG tablet, TAKE 1 TABLET IN THE MORNING AND NOON AND 2 TABLETS IN THE EVENING AS NEEDED, Disp: 120 tablet, Rfl: 2 .  Lancets (ACCU-CHEK SOFT TOUCH) lancets, Use as instructed, Disp: 100 each, Rfl: 12 .  levofloxacin (LEVAQUIN) 750 MG tablet, TK 1 T PO  D FOPR 10 DAYS, Disp: , Rfl:  .  loratadine (CLARITIN) 10 MG tablet, Take 1 tablet (10 mg total) by mouth daily., Disp: 30 tablet, Rfl: 11 .  Melatonin 3 MG TABS,  Take 1 tablet (3 mg total) by mouth at bedtime as needed., Disp: 30 tablet, Rfl: 3 .  methocarbamol (ROBAXIN) 500 MG tablet, Take 1 tablet (500 mg total) by mouth every 6 (six) hours as needed for muscle spasms., Disp: 60 tablet, Rfl: 0 .  MITIGARE 0.6 MG CAPS, Take 1 capsule by mouth daily., Disp: 30 capsule, Rfl: 3 .  montelukast (SINGULAIR) 10 MG tablet, Take 10 mg by mouth daily., Disp: , Rfl:  .  Multiple Vitamin (MULTIVITAMIN WITH MINERALS) TABS tablet, Take 1 tablet by mouth daily., Disp: , Rfl:  .  nicotine (NICODERM CQ - DOSED IN MG/24 HOURS) 14 mg/24hr patch, PLACE 1 PATCH ONTO THE SKIN DAILY, Disp: 30 patch, Rfl: 0 .   pantoprazole (PROTONIX) 40 MG tablet, TAKE 1 TABLET (40 MG TOTAL) BY MOUTH DAILY., Disp: 30 tablet, Rfl: 2 .  Potassium Chloride ER 20 MEQ TBCR, Take 20 mEq by mouth daily., Disp: 30 tablet, Rfl: 6 .  potassium chloride SA (K-DUR,KLOR-CON) 20 MEQ tablet, Take 20 mEq by mouth daily., Disp: , Rfl:  .  promethazine (PHENERGAN) 12.5 MG tablet, Take 1 tablet (12.5 mg total) by mouth every 8 (eight) hours as needed for nausea or vomiting., Disp: 15 tablet, Rfl: 0 .  simvastatin (ZOCOR) 20 MG tablet, TAKE 1 TABLET (20 MG TOTAL) BY MOUTH AT BEDTIME., Disp: 30 tablet, Rfl: 2 .  sucralfate (CARAFATE) 1 g tablet, Take 1 g by mouth 4 (four) times daily -  with meals and at bedtime., Disp: , Rfl:  .  tiotropium (SPIRIVA HANDIHALER) 18 MCG inhalation capsule, Place 1 capsule (18 mcg total) into inhaler and inhale daily., Disp: 30 capsule, Rfl: 12 .  valsartan-hydrochlorothiazide (DIOVAN-HCT) 160-12.5 MG tablet, TAKE 1 TABLET BY MOUTH DAILY., Disp: 30 tablet, Rfl: 2 .  Vitamins/Minerals TABS, Take by mouth., Disp: , Rfl:   Allergies  Allergen Reactions  . Other Shortness Of Breath    UNSPECIFIED AGENTS Allergic to perfumes and cleaning products  . Ace Inhibitors Cough and Other (See Comments)       . Aspirin Nausea Only    Vascular Examination: Capillary refill time <4 seconds x 10 digits Dorsalis pedis 1/4 b/l Posterior tibial pulses 0/4 right, 1/4 left No digital hair x 10 digits Skin temperature gradient WNL b/l  Dermatological Examination: Skin with normal turgor, texture and tone b/l  Toenails 1-5 b/l discolored, thick, dystrophic with subungual debris and pain with palpation to nailbeds due to thickness of nails.  Hyperkeratotic lesion plantarmedial hallux IPJ b/l  Musculoskeletal: Muscle strength 5/5 to all LE muscle groups  Neurological: Sensation diminished with 10 gram monofilament. Vibratory sensation diminished  Assessment: 1. Painful onychomycosis toenails 1-5 b/l 2. Callus  b/l hallux 3. NIDDM with Diabetic neuropathy 4. Claudication symptoms  Plan: 1. Will refer for outpatient  ABI studies for new onset of claudication symptoms (right > left) 2. Continue diabetic foot care principles.  3. Toenails 1-5 b/l were debrided in length and girth without iatrogenic bleeding. 4. Hyperkeratotic lesion pared with sterile chisel blade b/l hallux 5. Patient to continue soft, supportive shoe gear 6. Patient to report any pedal injuries to medical professional  7. Follow up 3 months. Patient/POA to call should there be a concern in the interim.

## 2018-02-28 NOTE — Telephone Encounter (Signed)
Received clinicals and faxed to 02/28/2018 to [facility].

## 2018-03-02 NOTE — Telephone Encounter (Signed)
Yes she was

## 2018-03-02 NOTE — Telephone Encounter (Signed)
[facility] - MEDICAID APPROVED [phone] ABI WITH TBI, AUTHORIZATION NUMBER:  [id number], EFFECTIVE DATE:  02/25/2018, END DATE: 03/27/2018. Faxed to [facility].

## 2018-03-03 ENCOUNTER — Ambulatory Visit (HOSPITAL_COMMUNITY)
Admission: RE | Admit: 2018-03-03 | Discharge: 2018-03-03 | Disposition: A | Discharge status: Home / Self Care | Payer: Medicaid Other | Source: Ambulatory Visit | Attending: Cardiology | Admitting: Cardiology

## 2018-03-03 DIAGNOSIS — F411 Generalized anxiety disorder: Secondary | ICD-10-CM | POA: Diagnosis not present

## 2018-03-03 DIAGNOSIS — I739 Peripheral vascular disease, unspecified: Secondary | ICD-10-CM

## 2018-03-03 DIAGNOSIS — R0989 Other specified symptoms and signs involving the circulatory and respiratory systems: Secondary | ICD-10-CM | POA: Diagnosis not present

## 2018-03-04 DIAGNOSIS — J301 Allergic rhinitis due to pollen: Secondary | ICD-10-CM | POA: Diagnosis not present

## 2018-03-04 DIAGNOSIS — J3081 Allergic rhinitis due to animal (cat) (dog) hair and dander: Secondary | ICD-10-CM | POA: Diagnosis not present

## 2018-03-04 DIAGNOSIS — J3089 Other allergic rhinitis: Secondary | ICD-10-CM | POA: Diagnosis not present

## 2018-03-06 ENCOUNTER — Other Ambulatory Visit: Payer: Self-pay | Admitting: Internal Medicine

## 2018-03-06 DIAGNOSIS — R6889 Other general symptoms and signs: Secondary | ICD-10-CM

## 2018-03-07 ENCOUNTER — Telehealth: Payer: Self-pay

## 2018-03-07 ENCOUNTER — Telehealth: Payer: Self-pay | Admitting: Internal Medicine

## 2018-03-07 NOTE — Telephone Encounter (Signed)
Contacted pt to go over Vas [place] pt is aware and doesn't have any questions or concerns

## 2018-03-07 NOTE — Telephone Encounter (Signed)
Pt called back in stating that she tried contacting [facility] of [name] to set up an appointment as requested but she had no luck would like to know if the appointment could be scheduled for her.  Please follow up

## 2018-03-09 ENCOUNTER — Other Ambulatory Visit: Payer: Self-pay | Admitting: Internal Medicine

## 2018-03-09 DIAGNOSIS — F17200 Nicotine dependence, unspecified, uncomplicated: Secondary | ICD-10-CM

## 2018-03-10 ENCOUNTER — Other Ambulatory Visit: Payer: Self-pay | Admitting: Internal Medicine

## 2018-03-10 DIAGNOSIS — E1142 Type 2 diabetes mellitus with diabetic polyneuropathy: Secondary | ICD-10-CM

## 2018-03-10 DIAGNOSIS — F411 Generalized anxiety disorder: Secondary | ICD-10-CM | POA: Diagnosis not present

## 2018-03-11 ENCOUNTER — Telehealth (INDEPENDENT_AMBULATORY_CARE_PROVIDER_SITE_OTHER): Payer: Self-pay | Admitting: Radiology

## 2018-03-11 ENCOUNTER — Encounter (INDEPENDENT_AMBULATORY_CARE_PROVIDER_SITE_OTHER): Payer: Self-pay | Admitting: Specialist

## 2018-03-11 DIAGNOSIS — J3081 Allergic rhinitis due to animal (cat) (dog) hair and dander: Secondary | ICD-10-CM | POA: Diagnosis not present

## 2018-03-11 DIAGNOSIS — J301 Allergic rhinitis due to pollen: Secondary | ICD-10-CM | POA: Diagnosis not present

## 2018-03-11 DIAGNOSIS — J3089 Other allergic rhinitis: Secondary | ICD-10-CM | POA: Diagnosis not present

## 2018-03-11 NOTE — Telephone Encounter (Signed)
I mailed letter to patient

## 2018-03-14 ENCOUNTER — Telehealth: Payer: Self-pay | Admitting: Internal Medicine

## 2018-03-14 NOTE — Telephone Encounter (Signed)
Patient called because she was told by vein and vascular that the referral did not contain information as to why she needed to be seen and treated for. Please follow up.

## 2018-03-14 NOTE — Telephone Encounter (Signed)
Reached out to [employer] from [facility] [place] about the situation. Per Revonda Standard they have all information they needed for her referral and they are in the process of scheduling

## 2018-03-15 DIAGNOSIS — J3089 Other allergic rhinitis: Secondary | ICD-10-CM | POA: Diagnosis not present

## 2018-03-15 DIAGNOSIS — J3081 Allergic rhinitis due to animal (cat) (dog) hair and dander: Secondary | ICD-10-CM | POA: Diagnosis not present

## 2018-03-15 DIAGNOSIS — J301 Allergic rhinitis due to pollen: Secondary | ICD-10-CM | POA: Diagnosis not present

## 2018-03-17 ENCOUNTER — Encounter (HOSPITAL_COMMUNITY): Payer: Self-pay | Admitting: *Deleted

## 2018-03-17 ENCOUNTER — Other Ambulatory Visit: Payer: Self-pay

## 2018-03-17 DIAGNOSIS — Z96651 Presence of right artificial knee joint: Secondary | ICD-10-CM | POA: Diagnosis not present

## 2018-03-17 DIAGNOSIS — F17210 Nicotine dependence, cigarettes, uncomplicated: Secondary | ICD-10-CM | POA: Insufficient documentation

## 2018-03-17 DIAGNOSIS — R52 Pain, unspecified: Secondary | ICD-10-CM | POA: Diagnosis not present

## 2018-03-17 DIAGNOSIS — J4541 Moderate persistent asthma with (acute) exacerbation: Secondary | ICD-10-CM | POA: Insufficient documentation

## 2018-03-17 DIAGNOSIS — M62838 Other muscle spasm: Secondary | ICD-10-CM | POA: Insufficient documentation

## 2018-03-17 DIAGNOSIS — M79604 Pain in right leg: Secondary | ICD-10-CM | POA: Diagnosis present

## 2018-03-17 DIAGNOSIS — I10 Essential (primary) hypertension: Secondary | ICD-10-CM | POA: Diagnosis not present

## 2018-03-17 DIAGNOSIS — E119 Type 2 diabetes mellitus without complications: Secondary | ICD-10-CM | POA: Diagnosis not present

## 2018-03-17 DIAGNOSIS — J449 Chronic obstructive pulmonary disease, unspecified: Secondary | ICD-10-CM | POA: Diagnosis not present

## 2018-03-17 DIAGNOSIS — Z79899 Other long term (current) drug therapy: Secondary | ICD-10-CM | POA: Diagnosis not present

## 2018-03-17 DIAGNOSIS — F411 Generalized anxiety disorder: Secondary | ICD-10-CM | POA: Diagnosis not present

## 2018-03-17 NOTE — ED Triage Notes (Signed)
EMS states pt has had this problem for months, has appointment March 3rd with Vascular surgery, has been worked up by PMD then referred. [id number]

## 2018-03-18 ENCOUNTER — Other Ambulatory Visit: Payer: Self-pay

## 2018-03-18 ENCOUNTER — Emergency Department (HOSPITAL_COMMUNITY)
Admission: EM | Admit: 2018-03-18 | Discharge: 2018-03-18 | Disposition: A | Discharge status: Home / Self Care | Payer: Medicaid Other | Attending: Emergency Medicine | Admitting: Emergency Medicine

## 2018-03-18 DIAGNOSIS — J3081 Allergic rhinitis due to animal (cat) (dog) hair and dander: Secondary | ICD-10-CM | POA: Diagnosis not present

## 2018-03-18 DIAGNOSIS — J301 Allergic rhinitis due to pollen: Secondary | ICD-10-CM | POA: Diagnosis not present

## 2018-03-18 DIAGNOSIS — M62838 Other muscle spasm: Secondary | ICD-10-CM

## 2018-03-18 DIAGNOSIS — J3089 Other allergic rhinitis: Secondary | ICD-10-CM | POA: Diagnosis not present

## 2018-03-18 LAB — CBC WITH DIFFERENTIAL/PLATELET
Abs Immature Granulocytes: 0.02 10*3/uL (ref 0.00–0.07)
Basophils Absolute: 0 10*3/uL (ref 0.0–0.1)
Basophils Relative: 0 %
Eosinophils Absolute: 0.1 10*3/uL (ref 0.0–0.5)
Eosinophils Relative: 1 %
HCT: 36.9 % (ref 36.0–46.0)
Hemoglobin: 11.9 g/dL — ABNORMAL LOW (ref 12.0–15.0)
Immature Granulocytes: 0 %
Lymphocytes Relative: 40 %
Lymphs Abs: 3.1 10*3/uL (ref 0.7–4.0)
MCH: 25.3 pg — ABNORMAL LOW (ref 26.0–34.0)
MCHC: 32.2 g/dL (ref 30.0–36.0)
MCV: 78.3 fL — ABNORMAL LOW (ref 80.0–100.0)
Monocytes Absolute: 0.5 10*3/uL (ref 0.1–1.0)
Monocytes Relative: 6 %
Neutro Abs: 4.1 10*3/uL (ref 1.7–7.7)
Neutrophils Relative %: 53 %
Platelets: 279 10*3/uL (ref 150–400)
RBC: 4.71 MIL/uL (ref 3.87–5.11)
RDW: 16.8 % — ABNORMAL HIGH (ref 11.5–15.5)
WBC: 7.9 10*3/uL (ref 4.0–10.5)
nRBC: 0 % (ref 0.0–0.2)

## 2018-03-18 LAB — BASIC METABOLIC PANEL
Anion gap: 9 (ref 5–15)
BUN: 14 mg/dL (ref 6–20)
CO2: 24 mmol/L (ref 22–32)
Calcium: 9.7 mg/dL (ref 8.9–10.3)
Chloride: 102 mmol/L (ref 98–111)
Creatinine, Ser: 0.92 mg/dL (ref 0.44–1.00)
GFR calc Af Amer: >60 mL/min (ref >60)
GFR calc non Af Amer: >60 mL/min (ref >60)
Glucose, Bld: 95 mg/dL (ref 70–99)
Potassium: 3.6 mmol/L (ref 3.5–5.1)
Sodium: 135 mmol/L (ref 135–145)

## 2018-03-18 MED ORDER — SODIUM CHLORIDE 0.9 % IV BOLUS
500.0000 mL | Freq: Once | INTRAVENOUS | Status: AC
  Administered 2018-03-18: 500 mL via INTRAVENOUS

## 2018-03-18 MED ORDER — TRAMADOL HCL 50 MG PO TABS: 50.0000 mg | ORAL_TABLET | Freq: Four times a day (QID) | ORAL | 0 refills | Status: DC | PRN

## 2018-03-18 NOTE — ED Provider Notes (Signed)
[facility] DEPT Provider Note   CSN: [id number] Arrival date & time: 03/17/18  2145     History   Chief Complaint Chief Complaint  Patient presents with  . Spasms    HPI Stacy [name] is a 58 y.o. female.  Patient presents to the emergency department for evaluation of bilateral leg pain.  Patient reports that she has been experiencing spasms of both of her legs for several months.  Spasms can occur at rest or with walking.  Her doctor is arranging for her to follow-up with vascular surgery but that is not until March 3.  Patient denies any injury.  She does not have numbness or tingling.  She has not noticed weakness of the legs.  She does report constant low back pain.     Past Medical History:  Diagnosis Date  . Arthritis   . Arthrofibrosis of total knee replacement ([place])    right  . Asthma   . COPD (chronic obstructive pulmonary disease) ([place])   . Diabetes mellitus    Type II  . GERD (gastroesophageal reflux disease)    Pt on Protonix daily  . Glaucoma   . Gout   . Headache(784.0)    otc meds prn  . Hyperlipidemia   . Hypertension    Pt on lisinopril  . Irritable bowel syndrome 11/19/2010  . Neuropathy   . Pneumonia   . Restless legs   . Shortness of breath    occasional - uses breathing tx at home    Patient Active Problem List   Diagnosis Date Noted  . Moderate persistent asthma without complication 63/84/5364  . Environmental and seasonal allergies 06/29/2017  . Controlled type 2 diabetes mellitus with diabetic polyneuropathy, without long-term current use of insulin ([place]) 06/29/2017  . Perennial allergic rhinitis 04/08/2017  . Sensorineural hearing loss (SNHL), bilateral 04/08/2017  . Chronic pansinusitis 03/25/2017  . Eustachian tube dysfunction, bilateral 03/25/2017  . Hearing difficulty of both ears 03/03/2017  . 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 Lichen planopilaris 68/04/2120  . Herniation of  lumbar intervertebral disc with radiculopathy 10/02/2016    Class: Chronic  . Alopecia areata 08/19/2016  . Chondromalacia of both patellae 06/03/2015    Class: Chronic  . Spinal stenosis, lumbar region, with neurogenic claudication 06/03/2015  . Tobacco use disorder 04/25/2015  . DJD (degenerative joint disease) of knee 01/04/2015  . Hemorrhoid 11/14/2014  . Gout of big toe 07/19/2014  . Essential hypertension 08/14/2013  . Gastroesophageal reflux disease without esophagitis 08/14/2013  . COPD (chronic obstructive pulmonary disease) ([place]) 04/17/2011    Past Surgical History:  Procedure Laterality Date  . CHOLECYSTECTOMY    . COLONOSCOPY    . ENDOMETRIAL ABLATION  10/2010  . HERNIA REPAIR     umbicial hernia  . KNEE ARTHROSCOPY Left    06/07/2017 Dr. Marlou Sa of [facility]  . KNEE CLOSED REDUCTION Right 12/06/2015   Procedure: CLOSED MANIPULATION RIGHT KNEE;  Surgeon: [name], MD;  Location: [facility];  Service: Orthopedics;  Laterality: Right;  . KNEE CLOSED REDUCTION Right 01/17/2016   Procedure: CLOSED MANIPULATION RIGHT KNEE;  Surgeon: [name], MD;  Location: [facility];  Service: Orthopedics;  Laterality: Right;  . KNEE JOINT MANIPULATION Right 12/06/2015  . LUMBAR DISC SURGERY  06/03/2015   L 2  L3 L4 L5   . LUMBAR LAMINECTOMY/DECOMPRESSION MICRODISCECTOMY N/A 06/03/2015   Procedure: Bilateral lateral recess decompression L2-3, L3-4, L4-5;  Surgeon: [name], MD;  Location:  [facility];  Service: Orthopedics;  Laterality: N/A;  . LUMBAR LAMINECTOMY/DECOMPRESSION MICRODISCECTOMY N/A 10/02/2016   Procedure: Right L5-S1 Lateral Recess Decompression  microdiscectomy;  Surgeon: [name], MD;  Location: [facility];  Service: Orthopedics;  Laterality: N/A;  . svd      x 2  . TOTAL KNEE ARTHROPLASTY Right 09/06/2015   Procedure: RIGHT TOTAL KNEE ARTHROPLASTY;  Surgeon: [name], MD;  Location: [facility];  Service: Orthopedics;  Laterality: Right;  . TUBAL LIGATION    . UPPER  GASTROINTESTINAL ENDOSCOPY  04/28/11     OB History   No obstetric history on file.      Home Medications    Prior to Admission medications   Medication Sig Start Date End Date Taking? Authorizing Provider  albuterol (PROVENTIL) (2.5 MG/3ML) 0.083% nebulizer solution USE 1 VIAL VIA NEBULIZER EVERY 6 HOURS AS NEEDED FOR WHEEZING Patient taking differently: Take 2.5 mg by nebulization every 6 (six) hours as needed for wheezing.  01/11/18  Yes [name], MD  allopurinol (ZYLOPRIM) 100 MG tablet Take 1 tablet (100 mg total) by mouth daily. 08/03/17  Yes [name], MD  budesonide-formoterol (SYMBICORT) 80-4.5 MCG/ACT inhaler INHALE TWO PUFFS BY MOUTH TWICE A DAY Patient taking differently: Inhale 2 puffs into the lungs 2 (two) times daily.  01/11/18  Yes [name], MD  celecoxib (CELEBREX) 200 MG capsule Take 1 capsule (200 mg total) by mouth 2 (two) times daily. 01/06/18  Yes [name], MD  diclofenac sodium (VOLTAREN) 1 % GEL Apply 4 g topically 4 (four) times daily. 02/09/18  Yes Dondra Prader R, NP  EPINEPHrine 0.3 mg/0.3 mL IJ SOAJ injection 0.3 mg IM x 1 PRN for allergic reaction 05/17/17  Yes [name], MD  fluticasone [facility]) 50 MCG/ACT nasal spray Place 1 spray into both nostrils daily. 06/29/17  Yes [name], MD  gabapentin (NEURONTIN) 300 MG capsule Take 1 capsule (300 mg total) by mouth 3 (three) times daily. 01/06/18  Yes [name], MD  glimepiride (AMARYL) 2 MG tablet TAKE 1 TABLET BY MOUTH DAILY BEFORE BREAKFAST. Patient taking differently: Take 2 mg by mouth daily.  03/10/18  Yes [name], MD  hydrochlorothiazide (HYDRODIURIL) 25 MG tablet TAKE 1 TABLET (25 MG TOTAL) BY MOUTH DAILY. 08/02/17  Yes [name], MD  hydrOXYzine (ATARAX/VISTARIL) 10 MG tablet TAKE 1 TABLET IN THE MORNING AND NOON AND 2 TABLETS IN THE EVENING AS NEEDED Patient taking differently: Take 10-20 mg by mouth as directed. TAKE 1 TABLET IN THE MORNING  AND NOON AND 2 TABLETS IN THE EVENING AS NEEDED for anxiety 02/11/18  Yes [name], MD  loratadine (CLARITIN) 10 MG tablet Take 1 tablet (10 mg total) by mouth daily. 06/29/17  Yes [name], MD  Melatonin 3 MG TABS Take 1 tablet (3 mg total) by mouth at bedtime as needed. Patient taking differently: Take 3 mg by mouth at bedtime as needed (sleep).  11/11/17  Yes [name], MD  methocarbamol (ROBAXIN) 500 MG tablet Take 1 tablet (500 mg total) by mouth every 6 (six) hours as needed for muscle spasms. 02/11/18  Yes [name], MD  MITIGARE 0.6 MG CAPS Take 1 capsule by mouth daily. 01/20/17  Yes Jegede, Olugbemiga E, MD  montelukast (SINGULAIR) 10 MG tablet Take 10 mg by mouth daily. 02/09/18  Yes [provider]  Multiple Vitamin (MULTIVITAMIN WITH MINERALS) TABS tablet Take 1 tablet by  mouth daily.   Yes [provider]  pantoprazole (PROTONIX) 40 MG tablet TAKE 1 TABLET (40 MG TOTAL) BY MOUTH DAILY. 02/11/18  Yes [name], MD  Potassium Chloride ER 20 MEQ TBCR Take 20 mEq by mouth daily. 02/11/18  Yes [name], MD  simvastatin (ZOCOR) 20 MG tablet TAKE 1 TABLET (20 MG TOTAL) BY MOUTH AT BEDTIME. 02/11/18  Yes [name], MD  sucralfate (CARAFATE) 1 g tablet Take 1 g by mouth 4 (four) times daily -  with meals and at bedtime.   Yes [provider]  tiotropium (SPIRIVA HANDIHALER) 18 MCG inhalation capsule Place 1 capsule (18 mcg total) into inhaler and inhale daily. 02/16/18  Yes [name], MD  valsartan-hydrochlorothiazide (DIOVAN-HCT) 160-12.5 MG tablet TAKE 1 TABLET BY MOUTH DAILY. 02/11/18  Yes [name], MD  ACCU-CHEK AVIVA PLUS test strip USE 3 TIMES A DAY AS DIRECTED BY PHYSICIAN 12/03/17   [name], MD  ACCU-CHEK SOFTCLIX LANCETS lancets USE AS INSTRUCTED 3 TIMES A DAY 12/03/17   [name], MD  Blood Glucose Monitoring Suppl (ACCU-CHEK AVIVA PLUS) w/Device KIT 1 each by Does  not apply route 3 (three) times daily. 10/20/17   Argentina Donovan, PA-C  Lancets (ACCU-CHEK SOFT TOUCH) lancets Use as instructed 10/20/17   Argentina Donovan, PA-C  traMADol (ULTRAM) 50 MG tablet Take 1 tablet (50 mg total) by mouth every 6 (six) hours as needed. 03/18/18   Orpah Greek, MD  Fluticasone-Salmeterol (ADVAIR) 500-50 MCG/DOSE AEPB Inhale 1 puff into the lungs every 12 (twelve) hours.    04/16/11  [provider]  lisinopril (PRINIVIL,ZESTRIL) 40 MG tablet Take 40 mg by mouth daily.    04/16/11  [provider]    Family History Family History  Problem Relation Age of Onset  . Hypertension Father   . Cancer Father   . Heart disease Mother   . Asthma Son        had as a child  . Heart disease Sister   . Breast cancer Sister   . Hypertension Brother     Social History Social History   Tobacco Use  . Smoking status: Current Some Day Smoker    Packs/day: 0.25    Years: 32.00    Pack years: 8.00    Types: Cigarettes  . Smokeless tobacco: Never Used  Substance Use Topics  . Alcohol use: No  . Drug use: No     Allergies   Other; Ace inhibitors; and Aspirin   Review of Systems Review of Systems  Musculoskeletal: Positive for back pain and myalgias.  All other systems reviewed and are negative.    Physical Exam Updated Vital Signs BP 114/81   Pulse 83   Temp 98.2 F (36.8 C)   Resp 19   Ht 5' 6"  (1.676 m)   Wt 77.6 kg   SpO2 99%   BMI 27.60 kg/m   Physical Exam Vitals signs and nursing note reviewed.  Constitutional:      General: She is not in acute distress.    Appearance: Normal appearance. She is well-developed.  HENT:     Head: Normocephalic and atraumatic.     Right Ear: Hearing normal.     Left Ear: Hearing normal.     Nose: Nose normal.  Eyes:     Conjunctiva/sclera: Conjunctivae normal.     Pupils: Pupils are equal, round, and reactive to light.  Neck:  Musculoskeletal: Normal range of motion and neck  supple.  Cardiovascular:     Rate and Rhythm: Regular rhythm.     Heart sounds: S1 normal and S2 normal. No murmur. No friction rub. No gallop.   Pulmonary:     Effort: Pulmonary effort is normal. No respiratory distress.     Breath sounds: Normal breath sounds.  Chest:     Chest wall: No tenderness.  Abdominal:     General: Bowel sounds are normal.     Palpations: Abdomen is soft.     Tenderness: There is no abdominal tenderness. There is no guarding or rebound. Negative signs include Murphy's sign and McBurney's sign.     Hernia: No hernia is present.  Musculoskeletal: Normal range of motion.     Lumbar back: She exhibits tenderness.       Back:     Comments: Bilateral straight leg raising worsens back pain, right greater than left  Skin:    General: Skin is warm and dry.     Findings: No rash.  Neurological:     Mental Status: She is alert and oriented to person, place, and time.     GCS: GCS eye subscore is 4. GCS verbal subscore is 5. GCS motor subscore is 6.     Cranial Nerves: No cranial nerve deficit.     Sensory: No sensory deficit.     Coordination: Coordination normal.     Comments: Normal strength and sensation bilateral lower extremities.  No saddle anesthesia.  Psychiatric:        Speech: Speech normal.        Behavior: Behavior normal.        Thought Content: Thought content normal.      ED Treatments / Results  Labs (all labs ordered are listed, but only abnormal results are displayed) Labs Reviewed  CBC WITH DIFFERENTIAL/PLATELET - Abnormal; Notable for the following components:      Result Value   Hemoglobin 11.9 (*)    MCV 78.3 (*)    MCH 25.3 (*)    RDW 16.8 (*)    All other components within normal limits  BASIC METABOLIC PANEL    EKG None  Radiology No results found.  Procedures Procedures (including critical care time)  Medications Ordered in ED Medications  sodium chloride 0.9 % bolus 500 mL (500 mLs Intravenous New Bag/Given  03/18/18 0251)     Initial Impression / Assessment and Plan / ED Course  I have reviewed the triage vital signs and the nursing notes.  Pertinent labs & imaging results that were available during my care of the patient were reviewed by me and considered in my medical decision making (see chart for details).    Patient presents to the emergency department for evaluation of bilateral lower extremity pain and cramping.  Patient reports that symptoms have been ongoing for months.  Examination is unremarkable.  Patient does not have any overlying skin changes to suggest cellulitis or infection.  She has bounding pulses present in both feet, no evidence of arterial obstruction.  Patient is complaining of low back pain which is somewhat chronic.  She does have possibly a minor radiculopathy component but no saddle anesthesia, weakness noted.  Neurologic examination is unremarkable, no need for imaging at this time.  She is afebrile, no risk factors for lumbar infection.  Lab work is unremarkable, no electrolyte abnormalities to explain muscle spasms.  Reviewing her records does reveal history of restless legs and peripheral neuropathy secondary  to her diabetes.  Symptoms are likely multifactorial, does not require any further emergency department work-up at this time.  Final Clinical Impressions(s) / ED Diagnoses   Final diagnoses:  Muscle spasm    ED Discharge Orders         Ordered    traMADol (ULTRAM) 50 MG tablet  Every 6 hours PRN     03/18/18 0345           Orpah Greek, MD 03/18/18 0345

## 2018-03-19 DIAGNOSIS — F411 Generalized anxiety disorder: Secondary | ICD-10-CM | POA: Diagnosis not present

## 2018-03-21 DIAGNOSIS — J3081 Allergic rhinitis due to animal (cat) (dog) hair and dander: Secondary | ICD-10-CM | POA: Diagnosis not present

## 2018-03-21 DIAGNOSIS — J301 Allergic rhinitis due to pollen: Secondary | ICD-10-CM | POA: Diagnosis not present

## 2018-03-21 DIAGNOSIS — J3089 Other allergic rhinitis: Secondary | ICD-10-CM | POA: Diagnosis not present

## 2018-03-23 DIAGNOSIS — J3089 Other allergic rhinitis: Secondary | ICD-10-CM | POA: Diagnosis not present

## 2018-03-23 DIAGNOSIS — J3081 Allergic rhinitis due to animal (cat) (dog) hair and dander: Secondary | ICD-10-CM | POA: Diagnosis not present

## 2018-03-23 DIAGNOSIS — J301 Allergic rhinitis due to pollen: Secondary | ICD-10-CM | POA: Diagnosis not present

## 2018-03-24 DIAGNOSIS — F411 Generalized anxiety disorder: Secondary | ICD-10-CM | POA: Diagnosis not present

## 2018-03-26 NOTE — Progress Notes (Signed)
Patient ID: Bettina Gavia, female   DOB: August 26, 1960, 58 y.o.   MRN: [id number]    Lorilyn Laitinen, is a 58 y.o. female  WUJ:[id number]  NFA:[id number]  DOB - 1960/04/10  Subjective:  Chief Complaint and HPI: Jaleena Viviani is a 58 y.o. female here today for a follow up visit After being seen in the ED 03/18/2018 for leg pain.  Has appt with vascular 04/05/2018.  Also has appt with ortho next week.  Tramadol and methocarbamol help a little for the pain.  She describes the pain as cramping and intermittent.  No SOB.  No erythema.  Pain has been present for several months.    From A/P: Patient presents to the emergency department for evaluation of bilateral lower extremity pain and cramping.  Patient reports that symptoms have been ongoing for months.  Examination is unremarkable.  Patient does not have any overlying skin changes to suggest cellulitis or infection.  She has bounding pulses present in both feet, no evidence of arterial obstruction.  Patient is complaining of low back pain which is somewhat chronic.  She does have possibly a minor radiculopathy component but no saddle anesthesia, weakness noted.  Neurologic examination is unremarkable, no need for imaging at this time.  She is afebrile, no risk factors for lumbar infection.  Lab work is unremarkable, no electrolyte abnormalities to explain muscle spasms.  Reviewing her records does reveal history of restless legs and peripheral neuropathy secondary to her diabetes.  Symptoms are likely multifactorial, does not require any further emergency department work-up at this time.  ED/Hospital notes reviewed.    ROS:   Constitutional:  No f/c, No night sweats, No unexplained weight loss. EENT:  No vision changes, No blurry vision, No hearing changes. No mouth, throat, or ear problems.  Respiratory: No cough, No SOB Cardiac: No CP, no palpitations GI:  No abd pain, No N/V/D. GU: No Urinary s/sx Musculoskeletal: see above Neuro: No  headache, no dizziness, no motor weakness.  Skin: No rash Endocrine:  No polydipsia. No polyuria.  Psych: Denies SI/HI  No problems updated.  ALLERGIES: Allergies  Allergen Reactions  . Other Shortness Of Breath    UNSPECIFIED AGENTS Allergic to perfumes and cleaning products  . Ace Inhibitors Cough and Other (See Comments)       . Aspirin Nausea Only    PAST MEDICAL HISTORY: Past Medical History:  Diagnosis Date  . Arthritis   . 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 Asthma   . COPD (chronic obstructive pulmonary disease) ([place])   . Diabetes mellitus    Type II  . GERD (gastroesophageal reflux disease)    Pt on Protonix daily  . Glaucoma   . Gout   . Headache(784.0)    otc meds prn  . Hyperlipidemia   . Hypertension    Pt on lisinopril  . Irritable bowel syndrome 11/19/2010  . Neuropathy   . Pneumonia   . Restless legs   . Shortness of breath    occasional - uses breathing tx at home    MEDICATIONS AT HOME: Prior to Admission medications   Medication Sig Start Date End Date Taking? Authorizing Provider  ACCU-CHEK AVIVA PLUS test strip USE 3 TIMES A DAY AS DIRECTED BY PHYSICIAN 12/03/17   [name], MD  ACCU-CHEK SOFTCLIX LANCETS lancets USE AS INSTRUCTED 3 TIMES A DAY 12/03/17   [name], MD  albuterol (PROVENTIL) (2.5 MG/3ML) 0.083% nebulizer solution USE 1 VIAL  VIA NEBULIZER EVERY 6 HOURS AS NEEDED FOR WHEEZING Patient taking differently: Take 2.5 mg by nebulization every 6 (six) hours as needed for wheezing.  01/11/18   [name], MD  allopurinol (ZYLOPRIM) 100 MG tablet Take 1 tablet (100 mg total) by mouth daily. 08/03/17   [name], MD  Blood Glucose Monitoring Suppl (ACCU-CHEK AVIVA PLUS) w/Device KIT 1 each by Does not apply route 3 (three) times daily. 10/20/17   Argentina Donovan, PA-C  budesonide-formoterol (SYMBICORT) 80-4.5 MCG/ACT inhaler INHALE TWO PUFFS BY MOUTH TWICE A DAY Patient taking  differently: Inhale 2 puffs into the lungs 2 (two) times daily.  01/11/18   [name], MD  celecoxib (CELEBREX) 200 MG capsule Take 1 capsule (200 mg total) by mouth 2 (two) times daily. 01/06/18   [name], MD  diclofenac sodium (VOLTAREN) 1 % GEL Apply 4 g topically 4 (four) times daily. 02/09/18   Suzan Slick, NP  EPINEPHrine 0.3 mg/0.3 mL IJ SOAJ injection 0.3 mg IM x 1 PRN for allergic reaction 05/17/17   [name], MD  fluticasone [facility]) 50 MCG/ACT nasal spray Place 1 spray into both nostrils daily. 06/29/17   [name], MD  gabapentin (NEURONTIN) 300 MG capsule Take 1 capsule (300 mg total) by mouth 3 (three) times daily. 01/06/18   [name], MD  glimepiride (AMARYL) 2 MG tablet TAKE 1 TABLET BY MOUTH DAILY BEFORE BREAKFAST. Patient taking differently: Take 2 mg by mouth daily.  03/10/18   [name], MD  hydrochlorothiazide (HYDRODIURIL) 25 MG tablet TAKE 1 TABLET (25 MG TOTAL) BY MOUTH DAILY. 08/02/17   [name], MD  hydrOXYzine (ATARAX/VISTARIL) 10 MG tablet TAKE 1 TABLET IN THE MORNING AND NOON AND 2 TABLETS IN THE EVENING AS NEEDED Patient taking differently: Take 10-20 mg by mouth as directed. TAKE 1 TABLET IN THE MORNING AND NOON AND 2 TABLETS IN THE EVENING AS NEEDED for anxiety 02/11/18   [name], MD  Lancets (ACCU-CHEK SOFT TOUCH) lancets Use as instructed 10/20/17   Argentina Donovan, PA-C  loratadine (CLARITIN) 10 MG tablet Take 1 tablet (10 mg total) by mouth daily. 06/29/17   [name], MD  Melatonin 3 MG TABS Take 1 tablet (3 mg total) by mouth at bedtime as needed. Patient taking differently: Take 3 mg by mouth at bedtime as needed (sleep).  11/11/17   [name], MD  methocarbamol (ROBAXIN) 500 MG tablet Take 2 tablets (1,000 mg total) by mouth every 6 (six) hours as needed for muscle spasms. 03/30/18   Argentina Donovan, PA-C  MITIGARE 0.6 MG CAPS Take 1 capsule by mouth daily. 01/20/17   Tresa Garter, MD  montelukast (SINGULAIR) 10 MG tablet Take 10 mg by mouth daily. 02/09/18   [provider]  Multiple Vitamin (MULTIVITAMIN WITH MINERALS) TABS tablet Take 1 tablet by mouth daily.    [provider]  pantoprazole (PROTONIX) 40 MG tablet TAKE 1 TABLET (40 MG TOTAL) BY MOUTH DAILY. 02/11/18   [name], MD  Potassium Chloride ER 20 MEQ TBCR Take 20 mEq by mouth daily. 02/11/18   [name], MD  simvastatin (ZOCOR) 20 MG tablet TAKE 1 TABLET (20 MG TOTAL) BY MOUTH AT BEDTIME. 02/11/18   [name], MD  sucralfate (CARAFATE) 1 g tablet Take 1 g by mouth 4 (four) times daily -  with meals and at bedtime.    [provider]  tiotropium (SPIRIVA HANDIHALER) 18 MCG inhalation capsule Place 1 capsule (18 mcg total) into inhaler and inhale daily. 02/16/18   [name], MD  traMADol (ULTRAM) 50 MG tablet Take 1 tablet (50 mg total) by mouth every 6 (six) hours as needed. 03/30/18   Argentina Donovan, PA-C  valsartan-hydrochlorothiazide (DIOVAN-HCT) 160-12.5 MG tablet TAKE 1 TABLET BY MOUTH DAILY. 02/11/18   [name], MD  Fluticasone-Salmeterol (ADVAIR) 500-50 MCG/DOSE AEPB Inhale 1 puff into the lungs every 12 (twelve) hours.    04/16/11  [provider]  lisinopril (PRINIVIL,ZESTRIL) 40 MG tablet Take 40 mg by mouth daily.    04/16/11  [provider]     Objective:  EXAM:   Vitals:   03/30/18 0855  BP: 114/78  Pulse: 78  Resp: 16  Temp: 98.2 F (36.8 C)  TempSrc: Oral  SpO2: 97%  Weight: 184 lb (83.5 kg)    General appearance : A&OX3. NAD. Non-toxic-appearing, ambulates using a cane HEENT: Atraumatic and Normocephalic.  PERRLA. EOM intact.  Neck: supple, no JVD. No cervical lymphadenopathy. No thyromegaly Chest/Lungs:  Breathing-non-labored, Good air entry bilaterally, breath sounds normal without rales, rhonchi, or wheezing  CVS: S1 S2 regular, no murmurs, gallops, rubs  Extremities: Bilateral  Lower Ext shows no edema, both legs are warm to touch with = pulse throughout.  ROM limited by pain.  No specific joint abnormalities in either extremity.   Neurology:  CN II-XII grossly intact, Non focal.   Psych:  TP linear. J/I WNL. Normal speech. Appropriate eye contact and affect.  Skin:  No Rash  Data Review Lab Results  Component Value Date   HGBA1C 6.5 (A) 02/11/2018   HGBA1C 6.9 11/11/2017   HGBA1C 6.4 08/09/2017     Assessment & Plan   1. Spinal stenosis, lumbar region, with neurogenic claudication Sees vascular next week and ortho - traMADol (ULTRAM) 50 MG tablet; Take 1 tablet (50 mg total) by mouth every 6 (six) hours as needed.  Dispense: 21 tablet; Refill: 0 Increase dose- methocarbamol (ROBAXIN) 500 MG tablet; Take 2 tablets (1,000 mg total) by mouth every 6 (six) hours as needed for muscle spasms.  Dispense: 90 tablet; Refill: 1  2. Controlled type 2 diabetes mellitus with diabetic polyneuropathy, without long-term current use of insulin (HCC) No changes to regimen - Glucose (CBG)  3. Muscle spasm - methocarbamol (ROBAXIN) 500 MG tablet; Take 2 tablets (1,000 mg total) by mouth every 6 (six) hours as needed for muscle spasms.  Dispense: 90 tablet; Refill: 1  4. Encounter for examination following treatment at hospital No changes  Patient have been counseled extensively about nutrition and exercise  Return for 4/16 appt with Dr [name].  The patient was given clear instructions to go to ER or return to medical center if symptoms don't improve, worsen or new problems develop. The patient verbalized understanding. The patient was told to call to get lab results if they haven't heard anything in the next week.     Freeman Caldron, PA-C [facility] and [facility] [place], [place]   03/30/2018, 9:13 AM

## 2018-03-28 DIAGNOSIS — J3089 Other allergic rhinitis: Secondary | ICD-10-CM | POA: Diagnosis not present

## 2018-03-28 DIAGNOSIS — J301 Allergic rhinitis due to pollen: Secondary | ICD-10-CM | POA: Diagnosis not present

## 2018-03-28 DIAGNOSIS — J3081 Allergic rhinitis due to animal (cat) (dog) hair and dander: Secondary | ICD-10-CM | POA: Diagnosis not present

## 2018-03-30 ENCOUNTER — Ambulatory Visit: Payer: Medicaid Other | Attending: Family Medicine | Admitting: Physician Assistant

## 2018-03-30 VITALS — BP 114/78 | HR 78 | Temp 98.2°F | Resp 16 | Wt 184.0 lb

## 2018-03-30 DIAGNOSIS — H409 Unspecified glaucoma: Secondary | ICD-10-CM | POA: Insufficient documentation

## 2018-03-30 DIAGNOSIS — M62838 Other muscle spasm: Secondary | ICD-10-CM | POA: Diagnosis not present

## 2018-03-30 DIAGNOSIS — K589 Irritable bowel syndrome without diarrhea: Secondary | ICD-10-CM | POA: Diagnosis not present

## 2018-03-30 DIAGNOSIS — Z7984 Long term (current) use of oral hypoglycemic drugs: Secondary | ICD-10-CM | POA: Insufficient documentation

## 2018-03-30 DIAGNOSIS — G2581 Restless legs syndrome: Secondary | ICD-10-CM | POA: Insufficient documentation

## 2018-03-30 DIAGNOSIS — Z79899 Other long term (current) drug therapy: Secondary | ICD-10-CM | POA: Insufficient documentation

## 2018-03-30 DIAGNOSIS — J449 Chronic obstructive pulmonary disease, unspecified: Secondary | ICD-10-CM | POA: Insufficient documentation

## 2018-03-30 DIAGNOSIS — E1142 Type 2 diabetes mellitus with diabetic polyneuropathy: Secondary | ICD-10-CM

## 2018-03-30 DIAGNOSIS — Z791 Long term (current) use of non-steroidal anti-inflammatories (NSAID): Secondary | ICD-10-CM | POA: Diagnosis not present

## 2018-03-30 DIAGNOSIS — E785 Hyperlipidemia, unspecified: Secondary | ICD-10-CM | POA: Diagnosis not present

## 2018-03-30 DIAGNOSIS — M109 Gout, unspecified: Secondary | ICD-10-CM | POA: Diagnosis not present

## 2018-03-30 DIAGNOSIS — K219 Gastro-esophageal reflux disease without esophagitis: Secondary | ICD-10-CM | POA: Diagnosis not present

## 2018-03-30 DIAGNOSIS — I10 Essential (primary) hypertension: Secondary | ICD-10-CM | POA: Diagnosis not present

## 2018-03-30 DIAGNOSIS — M1990 Unspecified osteoarthritis, unspecified site: Secondary | ICD-10-CM | POA: Diagnosis not present

## 2018-03-30 DIAGNOSIS — M48062 Spinal stenosis, lumbar region with neurogenic claudication: Secondary | ICD-10-CM | POA: Diagnosis not present

## 2018-03-30 DIAGNOSIS — Z09 Encounter for follow-up examination after completed treatment for conditions other than malignant neoplasm: Secondary | ICD-10-CM

## 2018-03-30 DIAGNOSIS — M79669 Pain in unspecified lower leg: Secondary | ICD-10-CM | POA: Diagnosis present

## 2018-03-30 LAB — GLUCOSE, POCT (MANUAL RESULT ENTRY): POC Glucose: 70 mg/dl (ref 70–99)

## 2018-03-30 MED ORDER — TRAMADOL HCL 50 MG PO TABS: 50.0000 mg | ORAL_TABLET | Freq: Four times a day (QID) | ORAL | 0 refills | Status: DC | PRN

## 2018-03-30 MED ORDER — METHOCARBAMOL 500 MG PO TABS: 1000.0000 mg | ORAL_TABLET | Freq: Four times a day (QID) | ORAL | 1 refills | Status: DC | PRN

## 2018-03-30 NOTE — Progress Notes (Signed)
Pt states she has spasms from b/l hip dow to legs  Pt states her calf feels tight all the time

## 2018-03-31 DIAGNOSIS — F411 Generalized anxiety disorder: Secondary | ICD-10-CM | POA: Diagnosis not present

## 2018-04-04 DIAGNOSIS — J301 Allergic rhinitis due to pollen: Secondary | ICD-10-CM | POA: Diagnosis not present

## 2018-04-04 DIAGNOSIS — J3081 Allergic rhinitis due to animal (cat) (dog) hair and dander: Secondary | ICD-10-CM | POA: Diagnosis not present

## 2018-04-04 DIAGNOSIS — J3089 Other allergic rhinitis: Secondary | ICD-10-CM | POA: Diagnosis not present

## 2018-04-05 ENCOUNTER — Other Ambulatory Visit: Payer: Self-pay

## 2018-04-05 ENCOUNTER — Encounter: Payer: Self-pay | Admitting: Vascular Surgery

## 2018-04-05 ENCOUNTER — Ambulatory Visit: Payer: Medicaid Other | Admitting: Vascular Surgery

## 2018-04-05 DIAGNOSIS — I739 Peripheral vascular disease, unspecified: Secondary | ICD-10-CM | POA: Diagnosis not present

## 2018-04-05 NOTE — Progress Notes (Signed)
Patient name: Stacy [name] MRN: [id number] DOB: 06-18-1960 Sex: female  REASON FOR CONSULT: Abnormal TBIs and leg cramps  HPI: Stacy [name] is a 58 y.o. female, with history of diabetes, COPD, hypertension, hyperlipidemia that presents for evaluation of lower extremity leg cramps in the setting of abnormal TBI's.  Patient reports for the last several months she has had cramping in her calves and thighs that she describes as radiating down her legs.  Feels her right leg is slightly worse.  At times this is when she walks but more often it is in the middle night when she is sleeping.  She has no pain in the foot itself.  She denies any nonhealing wounds or tissue loss.  She's had no previous lower extremity interventions.  She does smoke over a pack per day.  States she takes a statin but is allergic to aspirin.  She was recently seen in the ED and prescribed tramadol and also getting some muscular relaxants from the [facility].  Past Medical History:  Diagnosis Date  . Arthritis   . Arthrofibrosis of total knee replacement ([place])    right  . Asthma   . COPD (chronic obstructive pulmonary disease) ([place])   . Diabetes mellitus    Type II  . GERD (gastroesophageal reflux disease)    Pt on Protonix daily  . Glaucoma   . Gout   . Headache(784.0)    otc meds prn  . Hyperlipidemia   . Hypertension    Pt on lisinopril  . Irritable bowel syndrome 11/19/2010  . Neuropathy   . Pneumonia   . Restless legs   . Shortness of breath    occasional - uses breathing tx at home    Past Surgical History:  Procedure Laterality Date  . CHOLECYSTECTOMY    . COLONOSCOPY    . ENDOMETRIAL ABLATION  10/2010  . HERNIA REPAIR     umbicial hernia  . KNEE ARTHROSCOPY Left    06/07/2017 Dr. Marlou Sa of [facility]  . KNEE CLOSED REDUCTION Right 12/06/2015   Procedure: CLOSED MANIPULATION RIGHT KNEE;  Surgeon: [name], MD;  Location: [place];  Service: Orthopedics;   Laterality: Right;  . KNEE CLOSED REDUCTION Right 01/17/2016   Procedure: CLOSED MANIPULATION RIGHT KNEE;  Surgeon: [name], MD;  Location: [place];  Service: Orthopedics;  Laterality: Right;  . KNEE JOINT MANIPULATION Right 12/06/2015  . LUMBAR DISC SURGERY  06/03/2015   L 2  L3 L4 L5   . LUMBAR LAMINECTOMY/DECOMPRESSION MICRODISCECTOMY N/A 06/03/2015   Procedure: Bilateral lateral recess decompression L2-3, L3-4, L4-5;  Surgeon: [name], MD;  Location: [place];  Service: Orthopedics;  Laterality: N/A;  . LUMBAR LAMINECTOMY/DECOMPRESSION MICRODISCECTOMY N/A 10/02/2016   Procedure: Right L5-S1 Lateral Recess Decompression  microdiscectomy;  Surgeon: [name], MD;  Location: [place];  Service: Orthopedics;  Laterality: N/A;  . svd      x 2  . TOTAL KNEE ARTHROPLASTY Right 09/06/2015   Procedure: RIGHT TOTAL KNEE ARTHROPLASTY;  Surgeon: [name], MD;  Location: [place];  Service: Orthopedics;  Laterality: Right;  . TUBAL LIGATION    . UPPER GASTROINTESTINAL ENDOSCOPY  04/28/11    Family History  Problem Relation Age of Onset  . Hypertension Father   . Cancer Father   . Heart disease Mother   . Asthma Son        had as a child  . Heart disease Sister   .  Breast cancer Sister   . Hypertension Brother     SOCIAL HISTORY: Social History   Socioeconomic History  . Marital status: Married    Spouse name: Not on file  . Number of children: 2  . Years of education: Not on file  . Highest education level: Not on file  Occupational History  . Occupation: unemployed    Fish farm manager: UNEMPLOYED  Social Needs  . Financial resource strain: Not on file  . Food insecurity:    Worry: Not on file    Inability: Not on file  . Transportation needs:    Medical: Not on file    Non-medical: Not on file  Tobacco Use  . Smoking status: Current Some Day Smoker    Packs/day: 0.25    Years: 32.00    Pack years: 8.00    Types: Cigarettes  . Smokeless tobacco: Never Used  Substance and  Sexual Activity  . Alcohol use: No  . Drug use: No  . Sexual activity: Yes    Birth control/protection: Surgical  Lifestyle  . Physical activity:    Days per week: Not on file    Minutes per session: Not on file  . Stress: Not on file  Relationships  . Social connections:    Talks on phone: Not on file    Gets together: Not on file    Attends religious service: Not on file    Active member of club or organization: Not on file    Attends meetings of clubs or organizations: Not on file    Relationship status: Not on file  . Intimate partner violence:    Fear of current or ex partner: Not on file    Emotionally abused: Not on file    Physically abused: Not on file    Forced sexual activity: Not on file  Other Topics Concern  . Not on file  Social History Narrative  . Not on file    Allergies  Allergen Reactions  . Other Shortness Of Breath    UNSPECIFIED AGENTS Allergic to perfumes and cleaning products  . Ace Inhibitors Cough and Other (See Comments)       . Aspirin Nausea Only    Current Outpatient Medications  Medication Sig Dispense Refill  . ACCU-CHEK AVIVA PLUS test strip USE 3 TIMES A DAY AS DIRECTED BY PHYSICIAN 100 each 12  . ACCU-CHEK SOFTCLIX LANCETS lancets USE AS INSTRUCTED 3 TIMES A DAY 100 each 12  . albuterol (PROVENTIL) (2.5 MG/3ML) 0.083% nebulizer solution USE 1 VIAL VIA NEBULIZER EVERY 6 HOURS AS NEEDED FOR WHEEZING (Patient taking differently: Take 2.5 mg by nebulization every 6 (six) hours as needed for wheezing. ) 180 mL 1  . allopurinol (ZYLOPRIM) 100 MG tablet Take 1 tablet (100 mg total) by mouth daily. 90 tablet 3  . Blood Glucose Monitoring Suppl (ACCU-CHEK AVIVA PLUS) w/Device KIT 1 each by Does not apply route 3 (three) times daily. 1 kit 0  . budesonide-formoterol (SYMBICORT) 80-4.5 MCG/ACT inhaler INHALE TWO PUFFS BY MOUTH TWICE A DAY (Patient taking differently: Inhale 2 puffs into the lungs 2 (two) times daily. ) 1 Inhaler 2  . celecoxib  (CELEBREX) 200 MG capsule Take 1 capsule (200 mg total) by mouth 2 (two) times daily. 180 capsule 3  . diclofenac sodium (VOLTAREN) 1 % GEL Apply 4 g topically 4 (four) times daily. 3 Tube 1  . EPINEPHrine 0.3 mg/0.3 mL IJ SOAJ injection 0.3 mg IM x 1 PRN for allergic reaction 1  Device 1  . fluticasone (FLONASE) 50 MCG/ACT nasal spray Place 1 spray into both nostrils daily. 16 g 6  . gabapentin (NEURONTIN) 300 MG capsule Take 1 capsule (300 mg total) by mouth 3 (three) times daily. 270 capsule 3  . glimepiride (AMARYL) 2 MG tablet TAKE 1 TABLET BY MOUTH DAILY BEFORE BREAKFAST. (Patient taking differently: Take 2 mg by mouth daily. ) 30 tablet 2  . hydrochlorothiazide (HYDRODIURIL) 25 MG tablet TAKE 1 TABLET (25 MG TOTAL) BY MOUTH DAILY. 90 tablet 3  . hydrOXYzine (ATARAX/VISTARIL) 10 MG tablet TAKE 1 TABLET IN THE MORNING AND NOON AND 2 TABLETS IN THE EVENING AS NEEDED (Patient taking differently: Take 10-20 mg by mouth as directed. TAKE 1 TABLET IN THE MORNING AND NOON AND 2 TABLETS IN THE EVENING AS NEEDED for anxiety) 120 tablet 2  . Lancets (ACCU-CHEK SOFT TOUCH) lancets Use as instructed 100 each 12  . loratadine (CLARITIN) 10 MG tablet Take 1 tablet (10 mg total) by mouth daily. 30 tablet 11  . Melatonin 3 MG TABS Take 1 tablet (3 mg total) by mouth at bedtime as needed. (Patient taking differently: Take 3 mg by mouth at bedtime as needed (sleep). ) 30 tablet 3  . methocarbamol (ROBAXIN) 500 MG tablet Take 2 tablets (1,000 mg total) by mouth every 6 (six) hours as needed for muscle spasms. 90 tablet 1  . MITIGARE 0.6 MG CAPS Take 1 capsule by mouth daily. 30 capsule 3  . montelukast (SINGULAIR) 10 MG tablet Take 10 mg by mouth daily.    . Multiple Vitamin (MULTIVITAMIN WITH MINERALS) TABS tablet Take 1 tablet by mouth daily.    . pantoprazole (PROTONIX) 40 MG tablet TAKE 1 TABLET (40 MG TOTAL) BY MOUTH DAILY. 30 tablet 2  . Potassium Chloride ER 20 MEQ TBCR Take 20 mEq by mouth daily. 30 tablet  6  . simvastatin (ZOCOR) 20 MG tablet TAKE 1 TABLET (20 MG TOTAL) BY MOUTH AT BEDTIME. 30 tablet 2  . sucralfate (CARAFATE) 1 g tablet Take 1 g by mouth 4 (four) times daily -  with meals and at bedtime.    Marland Kitchen tiotropium (SPIRIVA HANDIHALER) 18 MCG inhalation capsule Place 1 capsule (18 mcg total) into inhaler and inhale daily. 30 capsule 12  . traMADol (ULTRAM) 50 MG tablet Take 1 tablet (50 mg total) by mouth every 6 (six) hours as needed. 21 tablet 0  . valsartan-hydrochlorothiazide (DIOVAN-HCT) 160-12.5 MG tablet TAKE 1 TABLET BY MOUTH DAILY. 30 tablet 2   No current facility-administered medications for this visit.     REVIEW OF SYSTEMS:  _0  denotes positive finding, _1  denotes negative finding Cardiac  Comments:  Chest pain or chest pressure:    Shortness of breath upon exertion:    Short of breath when lying flat:    Irregular heart rhythm:        Vascular    Pain in calf, thigh, or hip brought on by ambulation: x   Pain in feet at night that wakes you up from your sleep:     Blood clot in your veins:    Leg swelling:         Pulmonary    Oxygen at home:    Productive cough:     Wheezing:         Neurologic    Sudden weakness in arms or legs:     Sudden numbness in arms or legs:     Sudden onset of difficulty speaking or  slurred speech:    Temporary loss of vision in one eye:     Problems with dizziness:         Gastrointestinal    Blood in stool:     Vomited blood:         Genitourinary    Burning when urinating:     Blood in urine:        Psychiatric    Major depression:         Hematologic    Bleeding problems:    Problems with blood clotting too easily:        Skin    Rashes or ulcers:        Constitutional    Fever or chills:      PHYSICAL EXAM: Vitals:   04/05/18 0937  BP: 100/68  Pulse: 74  Resp: 18  SpO2: 95%  Weight: 182 lb 12.2 oz (82.9 kg)  Height: _0  (1.676 m)    GENERAL: The patient is a well-nourished female, in no acute  distress. The vital signs are documented above. CARDIAC: There is a regular rate and rhythm.  VASCULAR:  2+ femoral pulse palpable bilateral groins 2+ DP palpable bilaterally 1+ PT palpable bilaterally No tissue loss PULMONARY: There is good air exchange bilaterally without wheezing or rales. ABDOMEN: Soft and non-tender with normal pitched bowel sounds.  MUSCULOSKELETAL: There are no major deformities or cyanosis. NEUROLOGIC: No focal weakness or paresthesias are detected. SKIN: There are no ulcers or rashes noted. PSYCHIATRIC: The patient has a normal affect.  DATA:   Lower extremity segmental pressures 03/04/2018: Triphasic waveforms at the anterior tibial, posterior tibial, and peroneal arteries bilaterally with reduced toe pressures.  Assessment/Plan:  I discussed in detail with Ms. Jelinski that she has a number of risk factors for PAD including her ongoing tobacco abuse and diabetes.  On exam she has palpable dorsalis pedis and posterior tibial pulses at the ankle and this correlates with triphasic waveforms on her noninvasive imaging.  I suspect she has some component of small vessel disease given abnormal TBIs with reduced toe pressures.  In the absence of significant rest pain or tissue loss I do not feel strongly about any intervention at this time.  She wants to continue follow-up and I offered her follow-up again in 6 months with repeat ABIs for ongoing surveillance.  Discussed the importance of smoking cessation.  She is allergic to aspirin and states she cannot tolerate.   Marty Heck, MD [facility] Office: [phone] Pager: [phone]

## 2018-04-07 ENCOUNTER — Ambulatory Visit (INDEPENDENT_AMBULATORY_CARE_PROVIDER_SITE_OTHER): Payer: Medicaid Other | Admitting: Specialist

## 2018-04-07 ENCOUNTER — Encounter (INDEPENDENT_AMBULATORY_CARE_PROVIDER_SITE_OTHER): Payer: Self-pay | Admitting: Specialist

## 2018-04-07 VITALS — BP 112/65 | HR 83 | Ht 66.0 in | Wt 171.0 lb

## 2018-04-07 DIAGNOSIS — M1712 Unilateral primary osteoarthritis, left knee: Secondary | ICD-10-CM

## 2018-04-07 DIAGNOSIS — T8482XS Fibrosis due to internal orthopedic prosthetic devices, implants and grafts, sequela: Secondary | ICD-10-CM | POA: Diagnosis not present

## 2018-04-07 DIAGNOSIS — M25561 Pain in right knee: Secondary | ICD-10-CM

## 2018-04-07 DIAGNOSIS — G8929 Other chronic pain: Secondary | ICD-10-CM

## 2018-04-07 DIAGNOSIS — M5136 Other intervertebral disc degeneration, lumbar region: Secondary | ICD-10-CM | POA: Diagnosis not present

## 2018-04-07 DIAGNOSIS — I739 Peripheral vascular disease, unspecified: Secondary | ICD-10-CM

## 2018-04-07 MED ORDER — DICLOFENAC SODIUM 1 % TD GEL: 4.0000 g | Freq: Four times a day (QID) | TRANSDERMAL | 6 refills | Status: DC

## 2018-04-07 NOTE — Patient Instructions (Signed)
Plan:Avoid bending, stooping and avoid lifting weights greater than 10 lbs. Avoid prolong standing and walking. Avoid frequent bending and stooping  No lifting greater than 10 lbs. May use ice or moist heat for pain. Weight loss is of benefit. Handicap license is approved.  Knee is suffering from osteoarthritis, only real proven treatments are Weight loss,Unable to take arthritis meds due to aspirin allergy and exercise. Well padded shoes help. Ice the knee 2-3 times a day 15-20 mins at a time.  Use of tramadol and muscle relaxer is appropriate to treatment. Recommend that she be placed in pain management as she will Likely need continued treatment for pain but is not a candidate for more orthopaedic surgery at this point. A lumbar MRI without contrast due to shell fish allergy is ordered.

## 2018-04-07 NOTE — Progress Notes (Signed)
Office Visit Note   Patient: Stacy [name]           Date of Birth: 28-Dec-1960           MRN: [id number] Visit Date: 04/07/2018              Requested by: Marcine Matar, MD [address] [place], [place] [address] PCP: Marcine Matar, MD   Assessment & Plan: Visit Diagnoses:  1. Claudication of both lower extremities (HCC)   2. Other intervertebral disc degeneration, lumbar region   3. Unilateral primary osteoarthritis, left knee   4. Chronic pain of right knee   5. Arthrofibrosis of total knee arthroplasty, sequela     Plan:Avoid bending, stooping and avoid lifting weights greater than 10 lbs. Avoid prolong standing and walking. Avoid frequent bending and stooping  No lifting greater than 10 lbs. May use ice or moist heat for pain. Weight loss is of benefit. Handicap license is approved.  Knee is suffering from osteoarthritis, only real proven treatments are Weight loss,Unable to take arthritis meds due to aspirin allergy and exercise. Well padded shoes help. Ice the knee 2-3 times a day 15-20 mins at a time.  Use of tramadol and muscle relaxer is appropriate to treatment. Recommend that she be placed in pain management as she will Likely need continued treatment for pain but is not a candidate for more orthopaedic surgery at this point. A lumbar MRI without contrast due to shell fish allergy is ordered.  Follow-Up Instructions: No follow-ups on file.   Orders:  Orders Placed This Encounter  Procedures  . MR LUMBAR SPINE WO CONTRAST  . Ambulatory referral to Pain Clinic  . Ambulatory referral to Physical Therapy   Meds ordered this encounter  Medications  . diclofenac sodium (VOLTAREN) 1 % GEL    Sig: Apply 4 g topically 4 (four) times daily.    Dispense:  5 Tube    Refill:  6      Procedures: No procedures performed   Clinical Data: No additional findings.   Subjective: Chief Complaint  Patient presents with  . Lower Back -  Follow-up  . 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 She reports the left leg is giving away on her. She is having some help down and up stairs at home. She has a two story house with bath and shower up stairs. She reports that she began having bilateral leg spasms and went to the ER on 2/14. She has been seen by vein and vascular, Dr. Chestine Spore and he felt there was nothing to be done as yet as she is not having resting pain and has no "soft tissue loss". She is unable to take aspirin. S/P right TKR for patellofemoral osteoarthritis complicated by arthrofibrosis and required 2 post procedure manipulations to obtain a good ROM. She continues with stiffness in the right knee either standing of walking to long increases the pain. She had lumbar laminectomy surgeries. Has mild DJD left knee and is complaining primarily of pain and dysfunction related to her legs.  Problems are from the waist down.    Review of Systems  Constitutional: Negative.   HENT: Negative.   Eyes: Negative.   Respiratory: Negative.   Cardiovascular: Negative.   Gastrointestinal: Negative.   Endocrine: Negative.   Genitourinary: Negative.   Musculoskeletal: Negative.   Skin: Negative.   Allergic/Immunologic: Negative.   Neurological:  Negative.   Hematological: Negative.   Psychiatric/Behavioral: Negative.      Objective: Vital Signs: BP 112/65 (BP Location: Left Arm, Patient Position: Sitting)   Pulse 83   Ht 5\' 6"  (1.676 m)   Wt 171 lb (77.6 kg)   BMI 27.60 kg/m   Physical Exam Constitutional:      Appearance: She is well-developed.  HENT:     Head: Normocephalic and atraumatic.  Eyes:     Pupils: Pupils are equal, round, and reactive to light.  Neck:     Musculoskeletal: Normal range of motion and neck supple.  Pulmonary:     Effort: Pulmonary effort is normal.     Breath sounds: Normal breath sounds.  Abdominal:      General: Bowel sounds are normal.     Palpations: Abdomen is soft.  Skin:    General: Skin is warm and dry.  Neurological:     Mental Status: She is alert and oriented to person, place, and time.  Psychiatric:        Behavior: Behavior normal.        Thought Content: Thought content normal.        Judgment: Judgment normal.     Back Exam   Tenderness  The patient is experiencing tenderness in the lumbar.  Range of Motion  Extension: abnormal  Flexion: abnormal  Lateral bend right: normal  Lateral bend left: normal  Rotation right: normal  Rotation left: normal   Muscle Strength  Right Quadriceps:  5/5  Left Quadriceps:  5/5  Right Hamstrings:  5/5  Left Hamstrings:  5/5   Tests  Straight leg raise right: negative Straight leg raise left: negative  Reflexes  Patellar: Hyporeflexic Achilles: Hyporeflexic Biceps: normal  Other  Toe walk: normal Heel walk: normal Sensation: normal Gait: normal  Erythema: no back redness Scars: present  Comments:  Motor in both legs is normal SLR is negative. Previous surgeries were bilateral partial hemilaminectomies L2-3, L3-4 and L4-5 06/2015 and right L5-S1 partial hemilaminectomy with microdiscectomy 09/2016  Right TKR 09/2015.       Specialty Comments:  No specialty comments available.  Imaging: No results found.   PMFS History: Patient Active Problem List   Diagnosis Date Noted  . Herniation of lumbar intervertebral disc with radiculopathy 10/02/2016    Priority: High    Class: Chronic  . Chondromalacia of both patellae 06/03/2015    Priority: High    Class: Chronic  . PAD (peripheral artery disease) (HCC) 04/05/2018  . Moderate persistent asthma without complication 06/29/2017  . Environmental and seasonal allergies 06/29/2017  . Controlled type 2 diabetes mellitus with diabetic polyneuropathy, without long-term current use of insulin (HCC) 06/29/2017  . Perennial allergic rhinitis 04/08/2017  .  Sensorineural hearing loss (SNHL), bilateral 04/08/2017  . Chronic pansinusitis 03/25/2017  . Eustachian tube dysfunction, bilateral 03/25/2017  . Hearing difficulty of both ears 03/03/2017  . 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 Alopecia areata 08/19/2016  . Spinal stenosis, lumbar region, with neurogenic claudication 06/03/2015  . Tobacco use disorder 04/25/2015  . DJD (degenerative joint disease) of knee 01/04/2015  . Hemorrhoid 11/14/2014  . Gout of big toe 07/19/2014  . Essential hypertension 08/14/2013  . Gastroesophageal reflux disease without esophagitis 08/14/2013  . COPD (chronic obstructive pulmonary disease) (HCC) 04/17/2011   Past Medical History:  Diagnosis Date  . Arthritis   . 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 Asthma   .  COPD (chronic obstructive pulmonary disease) (HCC)   . Diabetes mellitus    Type II  . GERD (gastroesophageal reflux disease)    Pt on Protonix daily  . Glaucoma   . Gout   . Headache(784.0)    otc meds prn  . Hyperlipidemia   . Hypertension    Pt on lisinopril  . Irritable bowel syndrome 11/19/2010  . Neuropathy   . Pneumonia   . Restless legs   . Shortness of breath    occasional - uses breathing tx at home    Family History  Problem Relation Age of Onset  . Hypertension Father   . Cancer Father   . Heart disease Mother   . Asthma Son        had as a child  . Heart disease Sister   . Breast cancer Sister   . Hypertension Brother     Past Surgical History:  Procedure Laterality Date  . CHOLECYSTECTOMY    . COLONOSCOPY    . ENDOMETRIAL ABLATION  10/2010  . HERNIA REPAIR     umbicial hernia  . KNEE ARTHROSCOPY Left    [facility]07[facility]  . KNEE CLOSED REDUCTION Right 12/06/2015   Procedure: CLOSED MANIPULATION RIGHT KNEE;  Surgeon: Kerrin ChampagneJames E Ciel Chervenak, MD;  Location: [facility];  Service: Orthopedics;  Laterality: Right;  . KNEE CLOSED REDUCTION Right  01/17/2016   Procedure: CLOSED MANIPULATION RIGHT KNEE;  Surgeon: Kerrin ChampagneJames E Kendale Rembold, MD;  Location: [facility];  Service: Orthopedics;  Laterality: Right;  . KNEE JOINT MANIPULATION Right 12/06/2015  . LUMBAR DISC SURGERY  06/03/2015   L 2  L3 L4 L5   . LUMBAR LAMINECTOMY/DECOMPRESSION MICRODISCECTOMY N/A 06/03/2015   Procedure: Bilateral lateral recess decompression L2-3, L3-4, L4-5;  Surgeon: Kerrin ChampagneJames E Barb Shear, MD;  Location: [facility];  Service: Orthopedics;  Laterality: N/A;  . LUMBAR LAMINECTOMY/DECOMPRESSION MICRODISCECTOMY N/A 10/02/2016   Procedure: Right L5-S1 Lateral Recess Decompression  microdiscectomy;  Surgeon: Kerrin ChampagneNitka, Mylo Driskill E, MD;  [facility];  Service: Orthopedics;  Laterality: N/A;  . svd      x 2  . TOTAL KNEE ARTHROPLASTY Right 09/06/2015   Procedure: RIGHT TOTAL KNEE ARTHROPLASTY;  Surgeon: Kerrin ChampagneJames E Caitlinn Klinker, MD;  Location: [facility];  Service: Orthopedics;  Laterality: Right;  . TUBAL LIGATION    . UPPER GASTROINTESTINAL ENDOSCOPY  04/28/11   Social History   Occupational History  . Occupation: unemployed    Associate Professormployer: UNEMPLOYED  Tobacco Use  . Smoking status: Current Some Day Smoker    Packs/day: 0.25    Years: 32.00    Pack years: 8.00    Types: Cigarettes  . Smokeless tobacco: Never Used  Substance and Sexual Activity  . Alcohol use: No  . Drug use: No  . Sexual activity: Yes    Birth control/protection: Surgical

## 2018-04-09 DIAGNOSIS — F411 Generalized anxiety disorder: Secondary | ICD-10-CM | POA: Diagnosis not present

## 2018-04-14 DIAGNOSIS — J301 Allergic rhinitis due to pollen: Secondary | ICD-10-CM | POA: Diagnosis not present

## 2018-04-14 DIAGNOSIS — J3089 Other allergic rhinitis: Secondary | ICD-10-CM | POA: Diagnosis not present

## 2018-04-14 DIAGNOSIS — J3081 Allergic rhinitis due to animal (cat) (dog) hair and dander: Secondary | ICD-10-CM | POA: Diagnosis not present

## 2018-04-14 DIAGNOSIS — F411 Generalized anxiety disorder: Secondary | ICD-10-CM | POA: Diagnosis not present

## 2018-04-15 ENCOUNTER — Other Ambulatory Visit: Payer: Self-pay | Admitting: Internal Medicine

## 2018-04-15 DIAGNOSIS — F17200 Nicotine dependence, unspecified, uncomplicated: Secondary | ICD-10-CM

## 2018-04-18 DIAGNOSIS — J3089 Other allergic rhinitis: Secondary | ICD-10-CM | POA: Diagnosis not present

## 2018-04-18 DIAGNOSIS — J301 Allergic rhinitis due to pollen: Secondary | ICD-10-CM | POA: Diagnosis not present

## 2018-04-18 DIAGNOSIS — J3081 Allergic rhinitis due to animal (cat) (dog) hair and dander: Secondary | ICD-10-CM | POA: Diagnosis not present

## 2018-04-19 ENCOUNTER — Encounter: Payer: Self-pay | Admitting: Physical Therapy

## 2018-04-19 ENCOUNTER — Other Ambulatory Visit: Payer: Self-pay

## 2018-04-19 ENCOUNTER — Ambulatory Visit: Payer: Medicaid Other | Attending: Specialist | Admitting: Physical Therapy

## 2018-04-19 DIAGNOSIS — R262 Difficulty in walking, not elsewhere classified: Secondary | ICD-10-CM | POA: Diagnosis not present

## 2018-04-19 DIAGNOSIS — M5442 Lumbago with sciatica, left side: Secondary | ICD-10-CM | POA: Diagnosis not present

## 2018-04-19 DIAGNOSIS — M5441 Lumbago with sciatica, right side: Secondary | ICD-10-CM | POA: Insufficient documentation

## 2018-04-19 DIAGNOSIS — M62830 Muscle spasm of back: Secondary | ICD-10-CM | POA: Diagnosis not present

## 2018-04-19 NOTE — Therapy (Signed)
[facility]- [place] [address] [place], [place], [address] Phone: [phone]   Fax:  [phone]  Physical Therapy Evaluation  Patient Details  Name: Stacy [name] [facility] MRN: [id number] Date of Birth: [birth year] Referring Provider (PT): [name]   Encounter Date: 04/19/2018  PT End of Session - 04/19/18 0822    Visit Number  1    Number of Visits  4    Date for PT Re-Evaluation  05/23/18    Authorization Type  Medicaid    PT Start Time  0750    PT Stop Time  0840    PT Time Calculation (min)  50 min    Activity Tolerance  Patient tolerated treatment well;Patient limited by pain    Behavior During Therapy  [facility] for tasks assessed/performed       Past Medical History:  Diagnosis Date  . Arthritis   . 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 Asthma   . COPD (chronic obstructive pulmonary disease) (HCC)   . Diabetes mellitus    Type II  . GERD (gastroesophageal reflux disease)    Pt on Protonix daily  . Glaucoma   . Gout   . Headache(784.0)    otc meds prn  . Hyperlipidemia   . Hypertension    Pt on lisinopril  . Irritable bowel syndrome 11/19/2010  . Neuropathy   . Pneumonia   . Restless legs   . Shortness of breath    occasional - uses breathing tx at home    Past Surgical History:  Procedure Laterality Date  . CHOLECYSTECTOMY    . COLONOSCOPY    . ENDOMETRIAL ABLATION  10/2010  . HERNIA REPAIR     umbicial hernia  . KNEE ARTHROSCOPY Left    06/07/2017 Dr. August Saucer of [facility]  . KNEE CLOSED REDUCTION Right 12/06/2015   Procedure: CLOSED MANIPULATION RIGHT KNEE;  Surgeon: Kerrin Champagne, MD;  Location: [facility] OR;  Service: Orthopedics;  Laterality: Right;  . KNEE CLOSED REDUCTION Right 01/17/2016   Procedure: CLOSED MANIPULATION RIGHT KNEE;  Surgeon: Kerrin Champagne, MD;  Location: [facility] OR;  Service: Orthopedics;  Laterality: Right;  . KNEE JOINT MANIPULATION Right 12/06/2015  . LUMBAR DISC  SURGERY  06/03/2015   L 2  L3 L4 L5   . LUMBAR LAMINECTOMY/DECOMPRESSION MICRODISCECTOMY N/A 06/03/2015   Procedure: Bilateral lateral recess decompression L2-3, L3-4, L4-5;  Surgeon: Kerrin Champagne, MD;  Location: [facility] OR;  Service: Orthopedics;  Laterality: N/A;  . LUMBAR LAMINECTOMY/DECOMPRESSION MICRODISCECTOMY N/A 10/02/2016   Procedure: Right L5-S1 Lateral Recess Decompression  microdiscectomy;  Surgeon: Kerrin Champagne, MD;  Location: [facility] OR;  Service: Orthopedics;  Laterality: N/A;  . svd      x 2  . TOTAL KNEE ARTHROPLASTY Right 09/06/2015   Procedure: RIGHT TOTAL KNEE ARTHROPLASTY;  Surgeon: Kerrin Champagne, MD;  Location: [facility] OR;  Service: Orthopedics;  Laterality: Right;  . TUBAL LIGATION    . UPPER GASTROINTESTINAL ENDOSCOPY  04/28/11    There were no vitals filed for this visit.   Subjective Assessment - 04/19/18 0754    Subjective  Patient reports that she started having pain in the low back about 3 months ago, she reports that on 03/18/18 she started having spasms so bad that she had to call the ambulance.  She reports that the spasms are still having the spasms in the legs daily.  She will be having MRI tomorrow  Limitations  Standing;Walking;House hold activities    Patient Stated Goals  ave less pain and spasm    Currently in Pain?  Yes    Pain Score  7     Pain Location  Back    Pain Orientation  Lower    Pain Descriptors / Indicators  Aching;Spasm    Pain Type  Acute pain    Pain Radiating Towards  c/o spasms in both legs    Pain Onset  More than a month ago    Pain Frequency  Constant    Aggravating Factors   movements, twisting and rolling, walking pain up to 10/10    Pain Relieving Factors  being very careful with movements, ice and heat, mm relaxer at best pain a 4/10    Effect of Pain on Daily Activities  limits everything         [facility] PT Assessment - 04/19/18 0001      Assessment   Medical Diagnosis  LBP with leg pain    Referring Provider (PT)  [name]    Onset  Date/Surgical Date  04/07/18    Prior Therapy  for knees      Precautions   Precautions  None      Balance Screen   Has the patient fallen in the past 6 months  No    Has the patient had a decrease in activity level because of a fear of falling?   No    Is the patient reluctant to leave their home because of a fear of falling?   No      Home Environment   Additional Comments  has stairs, does some housework      Prior Function   Level of Independence  Independent with household mobility without device    Vocation  On disability    Leisure  no exercise      Posture/Postural Control   Posture Comments  fwd head, rounded shoulders, stooped with forward flexion with stnading      ROM / Strength   AROM / PROM / Strength  AROM;Strength      AROM   Overall AROM Comments  Lumbar ROM decreased 75% and very gaurded, c/o pain in the low back and the thighs      Strength   Overall Strength Comments  4-/5 for the LE's with pain in the anterior thighs and the low back      Flexibility   Soft Tissue Assessment /Muscle Length  yes    Hamstrings  tight    Quadriceps  very tight    ITB  tight    Piriformis  tight      Palpation   Palpation comment  she is very tight in the lumbar paraspinals, tight and tender here and in the thighs      Ambulation/Gait   Gait Comments  uses a SPC, slow, small steps, some antalgic gait bilaterally                Objective measurements completed on examination: See above findings.      [facility] Adult PT Treatment/Exercise - 04/19/18 0001      Modalities   Modalities  Moist Heat;Electrical Stimulation      Moist Heat Therapy   Number Minutes Moist Heat  15 Minutes    Moist Heat Location  Lumbar Spine;Hip      Electrical Stimulation   Electrical Stimulation Location  low back    Electrical Stimulation Action  IFC  Electrical Stimulation Parameters  supine    Electrical Stimulation Goals  Pain             PT Education -  04/19/18 0821    Education Details  Wms flexion    Person(s) Educated  Patient    Methods  Explanation;Demonstration;Handout    Comprehension  Verbalized understanding       PT Short Term Goals - 04/19/18 0831      PT SHORT TERM GOAL #1   Title  I with initial HEP    Time  2    Period  Weeks    Status  New        PT Long Term Goals - 04/19/18 0831      PT LONG TERM GOAL #1   Title  I with advanced HEP    Time  12    Period  Weeks    Status  New      PT LONG TERM GOAL #2   Title  increase lumbar ROM 25%    Time  12    Period  Weeks    Status  New      PT LONG TERM GOAL #3   Title  decreased pain 50% with ADLs    Time  12    Period  Weeks    Status  New      PT LONG TERM GOAL #4   Title  report able to go up and down stairs without spasms    Time  12    Period  Weeks    Status  New             Plan - 04/19/18 6606    Clinical Impression Statement  Patient with LBP starting about 3 months ago, she is unsure of why, has a history of 2 past back surgeries.  She reports that about a month ago she went to the ER due to severe spasms in the thighs, this continues now, she will be having an MRI tomorrow.  She is limited in her ROM, and function, has severe spasms in the legs, both quads.  This is causing her to have difficulty walking    Personal Factors and Comorbidities  Fitness;Past/Current Experience;Comorbidity 3+;Social Background;Education;Transportation    Comorbidities  Gout, 2 past back surgeries, TKR's, COPD    Examination-Activity Limitations  Bend;Stairs;Squat;Stand;Lift;Transfers;Locomotion Level    Examination-Participation Restrictions  Cleaning    Stability/Clinical Decision Making  Evolving/Moderate complexity    Clinical Decision Making  Moderate    PT Frequency  1x / week    PT Duration  12 weeks    PT Treatment/Interventions  ADLs/Self Care Home Management;Cryotherapy;Electrical Stimulation;Moist Heat;Traction;Ultrasound;Functional mobility  training;Patient/family education;Therapeutic exercise;Therapeutic activities;Manual techniques;Dry needling    PT Next Visit Plan  see what the MRI results were and proceed    Consulted and Agree with Plan of Care  Patient       Patient will benefit from skilled therapeutic intervention in order to improve the following deficits and impairments:  Abnormal gait, Decreased range of motion, Difficulty walking, Increased muscle spasms, Decreased endurance, Cardiopulmonary status limiting activity, Decreased activity tolerance, Pain, Improper body mechanics, Impaired flexibility, Decreased strength, Postural dysfunction  Visit Diagnosis: Acute bilateral low back pain with bilateral sciatica - Plan: PT plan of care cert/re-cert  Muscle spasm of back - Plan: PT plan of care cert/re-cert  Difficulty in walking, not elsewhere classified - Plan: PT plan of care cert/re-cert     Problem List Patient Active Problem  List   Diagnosis Date Noted  . PAD (peripheral artery disease) (HCC) 04/05/2018  . Moderate persistent asthma without complication 06/29/2017  . Environmental and seasonal allergies 06/29/2017  . Controlled type 2 diabetes mellitus with diabetic polyneuropathy, without long-term current use of insulin (HCC) 06/29/2017  . Perennial allergic rhinitis 04/08/2017  . Sensorineural hearing loss (SNHL), bilateral 04/08/2017  . Chronic pansinusitis 03/25/2017  . Eustachian tube dysfunction, bilateral 03/25/2017  . Hearing difficulty of both ears 03/03/2017  . 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 Herniation of lumbar intervertebral disc with radiculopathy 10/02/2016    Class: Chronic  . Alopecia areata 08/19/2016  . Chondromalacia of both patellae 06/03/2015    Class: Chronic  . Spinal stenosis, lumbar region, with neurogenic claudication 06/03/2015  . Tobacco use disorder 04/25/2015  . DJD (degenerative joint disease) of knee 01/04/2015  .  Hemorrhoid 11/14/2014  . Gout of big toe 07/19/2014  . Essential hypertension 08/14/2013  . Gastroesophageal reflux disease without esophagitis 08/14/2013  . COPD (chronic obstructive pulmonary disease) (HCC) 04/17/2011    Jearld Lesch., PT 04/19/2018, 8:37 AM  [facility] [place] [address] [place], [place], [address] Phone: [phone]   Fax:  [phone]  Name: Genessa Vessels [facility] MRN: [id number] Date of Birth: 02-18-1960

## 2018-04-20 ENCOUNTER — Encounter: Payer: Self-pay | Admitting: Anesthesiology

## 2018-04-20 ENCOUNTER — Ambulatory Visit
Admission: RE | Admit: 2018-04-20 | Discharge: 2018-04-20 | Disposition: A | Discharge status: Home / Self Care | Payer: Medicaid Other | Source: Ambulatory Visit | Attending: Specialist | Admitting: Specialist

## 2018-04-20 DIAGNOSIS — M5136 Other intervertebral disc degeneration, lumbar region: Secondary | ICD-10-CM

## 2018-04-20 DIAGNOSIS — M48061 Spinal stenosis, lumbar region without neurogenic claudication: Secondary | ICD-10-CM | POA: Diagnosis not present

## 2018-04-21 DIAGNOSIS — F411 Generalized anxiety disorder: Secondary | ICD-10-CM | POA: Diagnosis not present

## 2018-04-25 DIAGNOSIS — J3081 Allergic rhinitis due to animal (cat) (dog) hair and dander: Secondary | ICD-10-CM | POA: Diagnosis not present

## 2018-04-25 DIAGNOSIS — J301 Allergic rhinitis due to pollen: Secondary | ICD-10-CM | POA: Diagnosis not present

## 2018-04-25 DIAGNOSIS — J3089 Other allergic rhinitis: Secondary | ICD-10-CM | POA: Diagnosis not present

## 2018-04-27 ENCOUNTER — Telehealth: Payer: Self-pay | Admitting: Internal Medicine

## 2018-04-27 NOTE — Telephone Encounter (Signed)
Pt called in stating she has her nebulizer but she states shes missing tubing please follow up

## 2018-04-28 DIAGNOSIS — F411 Generalized anxiety disorder: Secondary | ICD-10-CM | POA: Diagnosis not present

## 2018-04-28 NOTE — Telephone Encounter (Signed)
Returned pt call and informed her that we do have the tubing here but I wouldn't know how she would get it because we are limiting our patient flow because of the COVID-19. Pt states that she went to [facility] and there were going to charge her 9 dollars for the piece. I informed pt basically she will need to get the piece. Pt states she may wait till her appointment in April. I informed pt that she should not want to wait till then to get the piece. Pt doesn't have any other questions or concerns

## 2018-05-02 DIAGNOSIS — H2513 Age-related nuclear cataract, bilateral: Secondary | ICD-10-CM | POA: Diagnosis not present

## 2018-05-02 DIAGNOSIS — E119 Type 2 diabetes mellitus without complications: Secondary | ICD-10-CM | POA: Diagnosis not present

## 2018-05-02 DIAGNOSIS — H538 Other visual disturbances: Secondary | ICD-10-CM | POA: Diagnosis not present

## 2018-05-02 DIAGNOSIS — H04559 Acquired stenosis of unspecified nasolacrimal duct: Secondary | ICD-10-CM | POA: Diagnosis not present

## 2018-05-03 DIAGNOSIS — J301 Allergic rhinitis due to pollen: Secondary | ICD-10-CM | POA: Diagnosis not present

## 2018-05-03 DIAGNOSIS — J3089 Other allergic rhinitis: Secondary | ICD-10-CM | POA: Diagnosis not present

## 2018-05-03 DIAGNOSIS — J3081 Allergic rhinitis due to animal (cat) (dog) hair and dander: Secondary | ICD-10-CM | POA: Diagnosis not present

## 2018-05-05 ENCOUNTER — Telehealth (INDEPENDENT_AMBULATORY_CARE_PROVIDER_SITE_OTHER): Payer: Self-pay | Admitting: Radiology

## 2018-05-05 DIAGNOSIS — F411 Generalized anxiety disorder: Secondary | ICD-10-CM | POA: Diagnosis not present

## 2018-05-05 NOTE — Telephone Encounter (Signed)
Called and spoke to patient, patient answered NO to all pre screening questions.  

## 2018-05-06 ENCOUNTER — Other Ambulatory Visit: Payer: Self-pay

## 2018-05-06 ENCOUNTER — Encounter (INDEPENDENT_AMBULATORY_CARE_PROVIDER_SITE_OTHER): Payer: Self-pay | Admitting: Specialist

## 2018-05-06 ENCOUNTER — Ambulatory Visit (INDEPENDENT_AMBULATORY_CARE_PROVIDER_SITE_OTHER): Payer: Medicaid Other | Admitting: Specialist

## 2018-05-06 VITALS — BP 145/81 | HR 91 | Ht 66.0 in | Wt 171.0 lb

## 2018-05-06 DIAGNOSIS — M5136 Other intervertebral disc degeneration, lumbar region: Secondary | ICD-10-CM

## 2018-05-06 DIAGNOSIS — M4726 Other spondylosis with radiculopathy, lumbar region: Secondary | ICD-10-CM | POA: Diagnosis not present

## 2018-05-06 DIAGNOSIS — E119 Type 2 diabetes mellitus without complications: Secondary | ICD-10-CM | POA: Diagnosis not present

## 2018-05-06 DIAGNOSIS — M48062 Spinal stenosis, lumbar region with neurogenic claudication: Secondary | ICD-10-CM | POA: Diagnosis not present

## 2018-05-06 DIAGNOSIS — M62838 Other muscle spasm: Secondary | ICD-10-CM

## 2018-05-06 DIAGNOSIS — M51369 Other intervertebral disc degeneration, lumbar region without mention of lumbar back pain or lower extremity pain: Secondary | ICD-10-CM

## 2018-05-06 MED ORDER — TRAMADOL HCL 50 MG PO TABS: 50.0000 mg | ORAL_TABLET | Freq: Four times a day (QID) | ORAL | 0 refills | Status: DC | PRN

## 2018-05-06 MED ORDER — DICLOFENAC SODIUM 1 % TD GEL: 4.0000 g | Freq: Four times a day (QID) | TRANSDERMAL | 6 refills | Status: DC

## 2018-05-06 NOTE — Progress Notes (Signed)
Office Visit Note   Patient: Stacy [name]           Date of Birth: 1960/09/16           MRN: [id number] Visit Date: 05/06/2018              Requested by: Marcine Matar, MD [address] [place], [place] [address] PCP: Marcine Matar, MD   Assessment & Plan: Visit Diagnoses:  1. Spinal stenosis of lumbar region with neurogenic claudication   2. Degenerative disc disease, lumbar   3. Other spondylosis with radiculopathy, lumbar region   4. Spinal stenosis, lumbar region, with neurogenic claudication   5. Type 2 diabetes mellitus without complication, without long-term current use of insulin (HCC)   6. Muscle spasm     Plan: Avoid bending, stooping and avoid lifting weights greater than 10 lbs. Avoid prolong standing and walking. Avoid frequent bending and stooping  No lifting greater than 10 lbs. May use ice or moist heat for pain. Weight loss is of benefit. Handicap license is approved. Continue with therapy and consider epidural steroids but surgical solutions unfortunately involve fusion of two levels L3-4 and L4-5 with both L2-3 and L5-S1 remaining worn and at risk of later being and source of pain.  Tramadol for severe pain. Celebrex is an NSAIDs and if you have symptoms of virus it should probably be stop. Methocarbomol for muscle spasm and gabapentin for nerve pain.   Follow-Up Instructions: Return in about 4 weeks (around 06/03/2018).   Orders:  No orders of the defined types were placed in this encounter.  Meds ordered this encounter  Medications  . traMADol (ULTRAM) 50 MG tablet    Sig: Take 1 tablet (50 mg total) by mouth every 6 (six) hours as needed.    Dispense:  21 tablet    Refill:  0  . diclofenac sodium (VOLTAREN) 1 % GEL    Sig: Apply 4 g topically 4 (four) times daily.    Dispense:  5 Tube    Refill:  6      Procedures: No procedures performed   Clinical Data: Findings:  Study Result   CLINICAL DATA:  Low back and  bilateral leg pain for 3-4 months. Prior lumbar surgeries.  EXAM: MRI LUMBAR SPINE WITHOUT CONTRAST  TECHNIQUE: Multiplanar, multisequence MR imaging of the lumbar spine was performed. No intravenous contrast was administered.  COMPARISON:  08/31/2016  FINDINGS: Segmentation: Standard.  Alignment: Unchanged slight retrolisthesis of L2 on L3 and L3 on L4. Lumbar spine straightening.  Vertebrae: No fracture or suspicious osseous lesion. Increased, mild Modic type 1 degenerative endplate changes at L3-4. Similar appearance of moderate type 2 endplate changes at L4-5.  Conus medullaris and cauda equina: Conus extends to the L1-2 level. Conus and cauda equina appear normal.  Paraspinal and other soft tissues: Postoperative changes in the posterior lumbar soft tissues. No fluid collection.  Disc levels:  Disc desiccation throughout the lumbar spine.  L1-2: Mild ligamentum flavum hypertrophy without disc herniation or stenosis.  L2-3: Progressive, moderate disc space narrowing. Prior posterior decompression. Progressive circumferential disc bulging and mild facet arthrosis result in mild bilateral lateral recess stenosis and mild bilateral neural foraminal stenosis without significant spinal stenosis.  L3-4: Progressive, mild-to-moderate disc space narrowing. Prior posterior decompression. Progressive circumferential disc bulging, a broad left subarticular to left foraminal disc protrusion, and mild facet arthrosis result in mild spinal stenosis, mild right and moderate to severe left lateral recess stenosis, and moderate  right and moderate to severe left neural foraminal stenosis. Potential left L3 and left L4 nerve root impingement.  L4-5: Unchanged moderate disc space narrowing. Prior posterior decompression. Circumferential disc bulging and mild facet arthrosis result in moderate bilateral neural foraminal stenosis without significant spinal stenosis,  unchanged.  L5-S1: Interval right laminectomy and microdiscectomy. No residual lateral recess or generalized spinal stenosis. Disc bulging and mild facet arthrosis result in moderate right and mild left neural foraminal stenosis, unchanged.  IMPRESSION: 1. Progressive disc degeneration at L2-3 and L3-4, now with moderate to severe left lateral recess and left greater than right neural foraminal stenosis at L3-4. 2. Interval L5-S1 microdiscectomy without residual spinal stenosis. Unchanged right greater than left neural foraminal stenosis. 3. Unchanged moderate bilateral neural foraminal stenosis at L4-5.   Electronically Signed   By: Sebastian Ache M.D.   On: 04/20/2018 08:28       Subjective: Chief Complaint  Patient presents with  . Left Leg - Follow-up    58 year old female with history of lumbar laminectomy L2-3, L3-4 and L4-5  With prior discectomy L5-S1 10/02/2016. She is experiencing pain with standing and walking with cramping into the left thigh and groin. She is experiencing weakness in the left thigh and the left knee wants to buckle on her with going down stairs. No bowel or bladder difficulty. She is noticing that the pain is left groin and left anterior thigh into the left knee.  Returns today for assessment and review of the lumbar MRI.    Review of Systems  Constitutional: Negative for activity change, appetite change, chills, diaphoresis, fatigue, fever and unexpected weight change.  HENT: Positive for congestion, postnasal drip, rhinorrhea, sinus pressure and sinus pain. Negative for dental problem, drooling, ear discharge, ear pain, facial swelling, hearing loss, mouth sores, nosebleeds, sneezing, sore throat, tinnitus, trouble swallowing and voice change.   Eyes: Negative for photophobia, pain, discharge, redness, itching and visual disturbance.  Respiratory: Negative for apnea, cough, choking, chest tightness, shortness of breath, wheezing and stridor.    Cardiovascular: Negative for chest pain, palpitations and leg swelling.  Gastrointestinal: Negative for abdominal distention, abdominal pain, anal bleeding, blood in stool, constipation, diarrhea, nausea, rectal pain and vomiting.  Endocrine: Negative for cold intolerance, heat intolerance, polydipsia, polyphagia and polyuria.  Genitourinary: Negative for difficulty urinating, dyspareunia, dysuria, enuresis, flank pain, frequency, genital sores, hematuria, menstrual problem, pelvic pain and urgency.  Musculoskeletal: Positive for arthralgias, back pain, gait problem and joint swelling. Negative for myalgias, neck pain and neck stiffness.  Skin: Negative.  Negative for color change, pallor, rash and wound.  Allergic/Immunologic: Positive for environmental allergies. Negative for food allergies and immunocompromised state.  Neurological: Positive for dizziness, weakness, light-headedness and numbness. Negative for tremors, seizures, syncope, facial asymmetry, speech difficulty and headaches.  Hematological: Negative for adenopathy. Does not bruise/bleed easily.  Psychiatric/Behavioral: Negative for agitation, behavioral problems, confusion, decreased concentration, dysphoric mood, hallucinations, self-injury, sleep disturbance and suicidal ideas. The patient is not nervous/anxious and is not hyperactive.      Objective: Vital Signs: BP (!) 145/81 (BP Location: Left Arm, Patient Position: Sitting)   Pulse 91   Ht  (1.676 m)   Wt 171 lb (77.6 kg)   BMI 27.60 kg/m   Physical Exam Constitutional:      Appearance: She is well-developed.  HENT:     Head: Normocephalic and atraumatic.  Eyes:     Pupils: Pupils are equal, round, and reactive to light.  Neck:  Musculoskeletal: Normal range of motion and neck supple.  Pulmonary:     Effort: Pulmonary effort is normal.     Breath sounds: Normal breath sounds.  Abdominal:     General: Bowel sounds are normal.     Palpations: Abdomen is  soft.  Skin:    General: Skin is warm and dry.  Neurological:     Mental Status: She is alert and oriented to person, place, and time.  Psychiatric:        Behavior: Behavior normal.        Thought Content: Thought content normal.        Judgment: Judgment normal.     Back Exam   Tenderness  The patient is experiencing tenderness in the lumbar.  Range of Motion  Extension: abnormal  Flexion: abnormal  Lateral bend right: normal  Lateral bend left: normal  Rotation right: normal  Rotation left: normal   Muscle Strength  Right Quadriceps:  5/5  Left Quadriceps:  5/5  Right Hamstrings:  5/5  Left Hamstrings:  5/5   Tests  Straight leg raise right: negative Straight leg raise left: negative  Reflexes  Patellar: 0/4 Achilles: 0/4 Biceps: 0/4  Other  Toe walk: normal Heel walk: normal Sensation: normal Gait: normal  Erythema: no back redness Scars: absent      Specialty Comments:  No specialty comments available.  Imaging: No results found.   PMFS History: Patient Active Problem List   Diagnosis Date Noted  . Herniation of lumbar intervertebral disc with radiculopathy 10/02/2016    Priority: High    Class: Chronic  . Chondromalacia of both patellae 06/03/2015    Priority: High    Class: Chronic  . PAD (peripheral artery disease) (HCC) 04/05/2018  . Moderate persistent asthma without complication 06/29/2017  . Environmental and seasonal allergies 06/29/2017  . Controlled type 2 diabetes mellitus with diabetic polyneuropathy, without long-term current use of insulin (HCC) 06/29/2017  . Perennial allergic rhinitis 04/08/2017  . Sensorineural hearing loss (SNHL), bilateral 04/08/2017  . Chronic pansinusitis 03/25/2017  . Eustachian tube dysfunction, bilateral 03/25/2017  . Hearing difficulty of both ears 03/03/2017  . 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 Alopecia areata 08/19/2016  . Spinal stenosis,  lumbar region, with neurogenic claudication 06/03/2015  . Tobacco use disorder 04/25/2015  . DJD (degenerative joint disease) of knee 01/04/2015  . Hemorrhoid 11/14/2014  . Gout of big toe 07/19/2014  . Essential hypertension 08/14/2013  . Gastroesophageal reflux disease without esophagitis 08/14/2013  . COPD (chronic obstructive pulmonary disease) (HCC) 04/17/2011   Past Medical History:  Diagnosis Date  . Arthritis   . 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 Asthma   . COPD (chronic obstructive pulmonary disease) (HCC)   . Diabetes mellitus    Type II  . GERD (gastroesophageal reflux disease)    Pt on Protonix daily  . Glaucoma   . Gout   . Headache(784.0)    otc meds prn  . Hyperlipidemia   . Hypertension    Pt on lisinopril  . Irritable bowel syndrome 11/19/2010  . Neuropathy   . Pneumonia   . Restless legs   . Shortness of breath    occasional - uses breathing tx at home    Family History  Problem Relation Age of Onset  . Hypertension Father   . Cancer Father   . Heart disease Mother   . Asthma Son  had as a child  . Heart disease Sister   . Breast cancer Sister   . Hypertension Brother     Past Surgical History:  Procedure Laterality Date  . CHOLECYSTECTOMY    . COLONOSCOPY    . ENDOMETRIAL ABLATION  10/2010  . HERNIA REPAIR     umbicial hernia  . KNEE ARTHROSCOPY Left    06/07/2017 Dr. August Saucer of [facility]  . KNEE CLOSED REDUCTION Right 12/06/2015   Procedure: CLOSED MANIPULATION RIGHT KNEE;  Surgeon: Kerrin Champagne, MD;  Location: [facility] OR;  Service: Orthopedics;  Laterality: Right;  . KNEE CLOSED REDUCTION Right 01/17/2016   Procedure: CLOSED MANIPULATION RIGHT KNEE;  Surgeon: Kerrin Champagne, MD;  Location: [facility] OR;  Service: Orthopedics;  Laterality: Right;  . KNEE JOINT MANIPULATION Right 12/06/2015  . LUMBAR DISC SURGERY  06/03/2015   L 2  L3 L4 L5   . LUMBAR LAMINECTOMY/DECOMPRESSION MICRODISCECTOMY N/A 06/03/2015   Procedure:  Bilateral lateral recess decompression L2-3, L3-4, L4-5;  Surgeon: Kerrin Champagne, MD;  Location: [facility] OR;  Service: Orthopedics;  Laterality: N/A;  . LUMBAR LAMINECTOMY/DECOMPRESSION MICRODISCECTOMY N/A 10/02/2016   Procedure: Right L5-S1 Lateral Recess Decompression  microdiscectomy;  Surgeon: Kerrin Champagne, MD;  [facility];  Service: Orthopedics;  Laterality: N/A;  . svd      x 2  . TOTAL KNEE ARTHROPLASTY Right 09/06/2015   Procedure: RIGHT TOTAL KNEE ARTHROPLASTY;  Surgeon: Kerrin Champagne, MD;  Location: [facility] OR;  Service: Orthopedics;  Laterality: Right;  . TUBAL LIGATION    . UPPER GASTROINTESTINAL ENDOSCOPY  04/28/11   Social History   Occupational History  . Occupation: unemployed    Associate Professor: UNEMPLOYED  Tobacco Use  . Smoking status: Current Some Day Smoker    Packs/day: 0.25    Years: 32.00    Pack years: 8.00    Types: Cigarettes  . Smokeless tobacco: Never Used  Substance and Sexual Activity  . Alcohol use: No  . Drug use: No  . Sexual activity: Yes    Birth control/protection: Surgical

## 2018-05-06 NOTE — Patient Instructions (Signed)
Plan: Avoid bending, stooping and avoid lifting weights greater than 10 lbs. Avoid prolong standing and walking. Avoid frequent bending and stooping  No lifting greater than 10 lbs. May use ice or moist heat for pain. Weight loss is of benefit. Handicap license is approved. Continue with therapy and consider epidural steroids but surgical solutions unfortunately involve fusion of two levels L3-4 and L4-5 with both L2-3 and L5-S1 remaining worn and at risk of later being and source of pain.  Tramadol for severe pain. Celebrex is an NSAIDs and if you have symptoms of virus it should probably be stop. Methocarbomol for muscle spasm and gabapentin for nerve pain.

## 2018-05-09 DIAGNOSIS — J3089 Other allergic rhinitis: Secondary | ICD-10-CM | POA: Diagnosis not present

## 2018-05-09 DIAGNOSIS — J301 Allergic rhinitis due to pollen: Secondary | ICD-10-CM | POA: Diagnosis not present

## 2018-05-09 DIAGNOSIS — J3081 Allergic rhinitis due to animal (cat) (dog) hair and dander: Secondary | ICD-10-CM | POA: Diagnosis not present

## 2018-05-11 ENCOUNTER — Other Ambulatory Visit: Payer: Self-pay

## 2018-05-11 ENCOUNTER — Ambulatory Visit: Payer: Medicaid Other | Attending: Specialist | Admitting: Physical Therapy

## 2018-05-11 ENCOUNTER — Encounter: Payer: Self-pay | Admitting: Physical Therapy

## 2018-05-11 DIAGNOSIS — M25561 Pain in right knee: Secondary | ICD-10-CM | POA: Diagnosis not present

## 2018-05-11 DIAGNOSIS — M25562 Pain in left knee: Secondary | ICD-10-CM

## 2018-05-11 DIAGNOSIS — M62830 Muscle spasm of back: Secondary | ICD-10-CM | POA: Diagnosis not present

## 2018-05-11 DIAGNOSIS — M5441 Lumbago with sciatica, right side: Secondary | ICD-10-CM | POA: Insufficient documentation

## 2018-05-11 DIAGNOSIS — M5442 Lumbago with sciatica, left side: Secondary | ICD-10-CM | POA: Insufficient documentation

## 2018-05-11 DIAGNOSIS — R262 Difficulty in walking, not elsewhere classified: Secondary | ICD-10-CM | POA: Insufficient documentation

## 2018-05-11 NOTE — Therapy (Signed)
[facility] [address] [place], [place], [address] Phone: [phone]   Fax:  [phone]  Physical Therapy Treatment  Patient Details  Name: Stacy [name] MRN: [id number] Date of Birth: 1961-01-23 Referring Provider (PT): Otelia SergeantNitka   Encounter Date: 05/11/2018  PT End of Session - 05/11/18 0829    Visit Number  2    Date for PT Re-Evaluation  05/23/18    Authorization Type  Medicaid    PT Start Time  0745    PT Stop Time  0841    PT Time Calculation (min)  56 min    Activity Tolerance  Patient tolerated treatment well    Behavior During Therapy  [facility] for tasks assessed/performed       Past Medical History:  Diagnosis Date  . Arthritis   . 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 Asthma   . COPD (chronic obstructive pulmonary disease) (HCC)   . Diabetes mellitus    Type II  . GERD (gastroesophageal reflux disease)    Pt on Protonix daily  . Glaucoma   . Gout   . Headache(784.0)    otc meds prn  . Hyperlipidemia   . Hypertension    Pt on lisinopril  . Irritable bowel syndrome 11/19/2010  . Neuropathy   . Pneumonia   . Restless legs   . Shortness of breath    occasional - uses breathing tx at home    Past Surgical History:  Procedure Laterality Date  . CHOLECYSTECTOMY    . COLONOSCOPY    . ENDOMETRIAL ABLATION  10/2010  . HERNIA REPAIR     umbicial hernia  . KNEE ARTHROSCOPY Left    [facility]07[facility]  . KNEE CLOSED REDUCTION Right 12/06/2015   Procedure: CLOSED MANIPULATION RIGHT KNEE;  Surgeon: Kerrin ChampagneJames E Nitka, MD;  Location: [facility] OR;  Service: Orthopedics;  Laterality: Right;  . KNEE CLOSED REDUCTION Right 01/17/2016   Procedure: CLOSED MANIPULATION RIGHT KNEE;  Surgeon: Kerrin ChampagneJames E Nitka, MD;  Location: [facility] OR;  Service: Orthopedics;  Laterality: Right;  . KNEE JOINT MANIPULATION Right 12/06/2015  . LUMBAR DISC SURGERY  06/03/2015   L 2  L3 L4 L5   . LUMBAR  LAMINECTOMY/DECOMPRESSION MICRODISCECTOMY N/A 06/03/2015   Procedure: Bilateral lateral recess decompression L2-3, L3-4, L4-5;  Surgeon: Kerrin ChampagneJames E Nitka, MD;  Location: [facility];  Service: Orthopedics;  Laterality: N/A;  . LUMBAR LAMINECTOMY/DECOMPRESSION MICRODISCECTOMY N/A 10/02/2016   Procedure: Right L5-S1 Lateral Recess Decompression  microdiscectomy;  Surgeon: Kerrin ChampagneNitka, James E, MD;  [facility];  Service: Orthopedics;  Laterality: N/A;  . svd      x 2  . TOTAL KNEE ARTHROPLASTY Right 09/06/2015   Procedure: RIGHT TOTAL KNEE ARTHROPLASTY;  Surgeon: Kerrin ChampagneJames E Nitka, MD;  Location: [facility];  Service: Orthopedics;  Laterality: Right;  . TUBAL LIGATION    . UPPER GASTROINTESTINAL ENDOSCOPY  04/28/11    There were no vitals filed for this visit.  Subjective Assessment - 05/11/18 0745    Subjective  Patient reports that she saw the MD last Friday, she has results of MRI  MRI shows stenosis of L2-3-4, moderate to severe.  She reports that the MD mentioned surgery but she would like to try without it.  Patient reports that she lost her exercise sheet so she has not done them    Currently in Pain?  Yes    Pain Score  8  Pain Location  Back    Pain Orientation  Lower    Pain Descriptors / Indicators  Aching    Aggravating Factors   activity    Pain Relieving Factors  pain meds, mm relaxer                       [facility] Adult PT Treatment/Exercise - 05/11/18 0001      Exercises   Exercises  Lumbar      Lumbar Exercises: Aerobic   Nustep  level 3 x 6 minutes      Lumbar Exercises: Machines for Strengthening   Cybex Knee Extension  5lb 2x10     Cybex Knee Flexion  25lb 2x10     Other Lumbar Machine Exercise  seated rows , lats 15# 2x10    Other Lumbar Machine Exercise  Rows & Lats 15lb 2x10      Lumbar Exercises: Supine   Other Supine Lumbar Exercises  LE on ball brigges, K2C, Oblq.       Modalities   Modalities  Moist Heat;Electrical Stimulation      Moist Heat Therapy    Number Minutes Moist Heat  15 Minutes    Moist Heat Location  Lumbar Spine;Hip      Electrical Stimulation   Electrical Stimulation Location  low back    Electrical Stimulation Action  IFC    Electrical Stimulation Parameters  supine    Electrical Stimulation Goals  Pain               PT Short Term Goals - 05/11/18 0830      PT SHORT TERM GOAL #1   Title  I with initial HEP    Status  On-going        PT Long Term Goals - 05/11/18 0830      PT LONG TERM GOAL #1   Title  I with advanced HEP    Status  On-going            Plan - 05/11/18 0830    Clinical Impression Statement  Pt tolerated an initial progression to TE well, evident by no subjective reports of increase pain. Postural que's required to keep shoulders back with seated rows. Cues to complete full available ROM with seated curls and extensions.     Personal Factors and Comorbidities  Fitness;Past/Current Experience;Comorbidity 3+;Social Background;Education;Transportation    Comorbidities  Gout, 2 past back surgeries, TKR's, COPD    Examination-Activity Limitations  Bend;Stairs;Squat;Stand;Lift;Transfers;Locomotion Level    Examination-Participation Restrictions  Cleaning    Stability/Clinical Decision Making  Evolving/Moderate complexity    PT Frequency  1x / week    PT Treatment/Interventions  ADLs/Self Care Home Management;Cryotherapy;Electrical Stimulation;Moist Heat;Traction;Ultrasound;Functional mobility training;Patient/family education;Therapeutic exercise;Therapeutic activities;Manual techniques;Dry needling    PT Next Visit Plan  Posterior chain strengthening and core stability       Patient will benefit from skilled therapeutic intervention in order to improve the following deficits and impairments:  Abnormal gait, Decreased range of motion, Difficulty walking, Increased muscle spasms, Decreased endurance, Cardiopulmonary status limiting activity, Decreased activity tolerance, Pain, Improper  body mechanics, Impaired flexibility, Decreased strength, Postural dysfunction  Visit Diagnosis: Acute bilateral low back pain with bilateral sciatica  Muscle spasm of back  Difficulty in walking, not elsewhere classified  Acute pain of left knee     Problem List Patient Active Problem List   Diagnosis Date Noted  . PAD (peripheral artery disease) (HCC) 04/05/2018  . Moderate persistent asthma without complication 06/29/2017  .  Environmental and seasonal allergies 06/29/2017  . Controlled type 2 diabetes mellitus with diabetic polyneuropathy, without long-term current use of insulin (HCC) 06/29/2017  . Perennial allergic rhinitis 04/08/2017  . Sensorineural hearing loss (SNHL), bilateral 04/08/2017  . Chronic pansinusitis 03/25/2017  . Eustachian tube dysfunction, bilateral 03/25/2017  . Hearing difficulty of both ears 03/03/2017  . 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 Herniation of lumbar intervertebral disc with radiculopathy 10/02/2016    Class: Chronic  . Alopecia areata 08/19/2016  . Chondromalacia of both patellae 06/03/2015    Class: Chronic  . Spinal stenosis, lumbar region, with neurogenic claudication 06/03/2015  . Tobacco use disorder 04/25/2015  . DJD (degenerative joint disease) of knee 01/04/2015  . Hemorrhoid 11/14/2014  . Gout of big toe 07/19/2014  . Essential hypertension 08/14/2013  . Gastroesophageal reflux disease without esophagitis 08/14/2013  . COPD (chronic obstructive pulmonary disease) (HCC) 04/17/2011    Grayce Sessions, PTA 05/11/2018, 8:33 AM  [facility] [address] [place], [place], [address] Phone: [phone]   Fax:  [phone]  Name: Stacy [name] [facility] MRN: [id number] Date of Birth: 01-24-1961

## 2018-05-12 ENCOUNTER — Other Ambulatory Visit: Payer: Self-pay | Admitting: Internal Medicine

## 2018-05-12 ENCOUNTER — Other Ambulatory Visit: Payer: Self-pay | Admitting: Physician Assistant

## 2018-05-12 DIAGNOSIS — F17200 Nicotine dependence, unspecified, uncomplicated: Secondary | ICD-10-CM

## 2018-05-12 DIAGNOSIS — K219 Gastro-esophageal reflux disease without esophagitis: Secondary | ICD-10-CM

## 2018-05-12 DIAGNOSIS — E119 Type 2 diabetes mellitus without complications: Secondary | ICD-10-CM

## 2018-05-12 DIAGNOSIS — M62838 Other muscle spasm: Secondary | ICD-10-CM

## 2018-05-12 DIAGNOSIS — F411 Generalized anxiety disorder: Secondary | ICD-10-CM | POA: Diagnosis not present

## 2018-05-12 DIAGNOSIS — M48062 Spinal stenosis, lumbar region with neurogenic claudication: Secondary | ICD-10-CM

## 2018-05-12 DIAGNOSIS — I10 Essential (primary) hypertension: Secondary | ICD-10-CM

## 2018-05-16 DIAGNOSIS — J301 Allergic rhinitis due to pollen: Secondary | ICD-10-CM | POA: Diagnosis not present

## 2018-05-16 DIAGNOSIS — J3089 Other allergic rhinitis: Secondary | ICD-10-CM | POA: Diagnosis not present

## 2018-05-16 DIAGNOSIS — J3081 Allergic rhinitis due to animal (cat) (dog) hair and dander: Secondary | ICD-10-CM | POA: Diagnosis not present

## 2018-05-18 ENCOUNTER — Ambulatory Visit: Payer: Medicaid Other | Admitting: Physical Medicine & Rehabilitation

## 2018-05-18 ENCOUNTER — Ambulatory Visit: Payer: Medicaid Other | Admitting: Physical Therapy

## 2018-05-18 ENCOUNTER — Other Ambulatory Visit: Payer: Self-pay

## 2018-05-18 ENCOUNTER — Encounter: Payer: Self-pay | Admitting: Physical Therapy

## 2018-05-18 DIAGNOSIS — M5442 Lumbago with sciatica, left side: Secondary | ICD-10-CM

## 2018-05-18 DIAGNOSIS — M5441 Lumbago with sciatica, right side: Secondary | ICD-10-CM

## 2018-05-18 DIAGNOSIS — M62830 Muscle spasm of back: Secondary | ICD-10-CM | POA: Diagnosis not present

## 2018-05-18 DIAGNOSIS — M25562 Pain in left knee: Secondary | ICD-10-CM | POA: Diagnosis not present

## 2018-05-18 DIAGNOSIS — R262 Difficulty in walking, not elsewhere classified: Secondary | ICD-10-CM

## 2018-05-18 DIAGNOSIS — M25561 Pain in right knee: Secondary | ICD-10-CM | POA: Diagnosis not present

## 2018-05-18 NOTE — Therapy (Signed)
[facility] [address] [place], [place], [address] Phone: [phone]   Fax:  [phone]  Physical Therapy Treatment  Patient Details  Name: Stacy Pigeonamela Eleazer Richland [name] MRN: [id number] Date of Birth: 22-Jul-1960 Referring Provider (PT): Otelia SergeantNitka   Encounter Date: 05/18/2018  PT End of Session - 05/18/18 0836    Visit Number  3    Date for PT Re-Evaluation  05/23/18    Authorization Type  Medicaid    PT Start Time  0755    PT Stop Time  0851    PT Time Calculation (min)  56 min    Activity Tolerance  Patient tolerated treatment well    Behavior During Therapy  [facility] for tasks assessed/performed       Past Medical History:  Diagnosis Date  . Arthritis   . 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 Asthma   . COPD (chronic obstructive pulmonary disease) (HCC)   . Diabetes mellitus    Type II  . GERD (gastroesophageal reflux disease)    Pt on Protonix daily  . Glaucoma   . Gout   . Headache(784.0)    otc meds prn  . Hyperlipidemia   . Hypertension    Pt on lisinopril  . Irritable bowel syndrome 11/19/2010  . Neuropathy   . Pneumonia   . Restless legs   . Shortness of breath    occasional - uses breathing tx at home    Past Surgical History:  Procedure Laterality Date  . CHOLECYSTECTOMY    . COLONOSCOPY    . ENDOMETRIAL ABLATION  10/2010  . HERNIA REPAIR     umbicial hernia  . KNEE ARTHROSCOPY Left    [facility]07[facility]  . KNEE CLOSED REDUCTION Right 12/06/2015   Procedure: CLOSED MANIPULATION RIGHT KNEE;  Surgeon: Kerrin ChampagneJames E Nitka, MD;  [facility];  Service: Orthopedics;  Laterality: Right;  . KNEE CLOSED REDUCTION Right 01/17/2016   Procedure: CLOSED MANIPULATION RIGHT KNEE;  Surgeon: Kerrin ChampagneJames E Nitka, MD;  [facility];  Service: Orthopedics;  Laterality: Right;  . KNEE JOINT MANIPULATION Right 12/06/2015  . LUMBAR DISC SURGERY  06/03/2015   L 2  L3 L4 L5   . LUMBAR  LAMINECTOMY/DECOMPRESSION MICRODISCECTOMY N/A 06/03/2015   Procedure: Bilateral lateral recess decompression L2-3, L3-4, L4-5;  Surgeon: Kerrin ChampagneJames E Nitka, MD;  [facility];  Service: Orthopedics;  Laterality: N/A;  . LUMBAR LAMINECTOMY/DECOMPRESSION MICRODISCECTOMY N/A 10/02/2016   Procedure: Right L5-S1 Lateral Recess Decompression  microdiscectomy;  Surgeon: Kerrin ChampagneNitka, James E, MD;  [facility];  Service: Orthopedics;  Laterality: N/A;  . svd      x 2  . TOTAL KNEE ARTHROPLASTY Right 09/06/2015   Procedure: RIGHT TOTAL KNEE ARTHROPLASTY;  Surgeon: Kerrin ChampagneJames E Nitka, MD;  [facility]: [facility];  Service: Orthopedics;  Laterality: Right;  . TUBAL LIGATION    . UPPER GASTROINTESTINAL ENDOSCOPY  04/28/11    There were no vitals filed for this visit.  Subjective Assessment - 05/18/18 0757    Subjective  "Pretty good" Pt reports that her L knee went out on her yesterday. She did not fall     Currently in Pain?  Yes    Pain Score  6     Pain Location  Leg    Pain Orientation  Left  [facility] Adult PT Treatment/Exercise - 05/18/18 0001      Lumbar Exercises: Aerobic   Nustep  level 3 x 6 minutes      Lumbar Exercises: Machines for Strengthening   Cybex Knee Extension  5lb 3x10     Cybex Knee Flexion  20lb 3x10     Leg Press  20lb 2x10     Other Lumbar Machine Exercise  Rows & Lats 20lb 2x10      Lumbar Exercises: Standing   Row  Strengthening;20 reps;Theraband;Both    Theraband Level (Row)  Level 2 (Red)    Shoulder Extension  Theraband;Power Tower;20 reps;Strengthening    Theraband Level (Shoulder Extension)  Level 2 (Red)      Lumbar Exercises: Supine   Other Supine Lumbar Exercises  LE on ball brigges, K2C, Oblq.       Modalities   Modalities  Moist Heat;Electrical Stimulation      Moist Heat Therapy   Number Minutes Moist Heat  15 Minutes    Moist Heat Location  Lumbar Spine      Electrical Stimulation   Electrical Stimulation Location  low back     Electrical Stimulation Action  IFC    Electrical Stimulation Parameters  supine    Electrical Stimulation Goals  Pain               PT Short Term Goals - 05/11/18 0830      PT SHORT TERM GOAL #1   Title  I with initial HEP    Status  On-going        PT Long Term Goals - 05/11/18 0830      PT LONG TERM GOAL #1   Title  I with advanced HEP    Status  On-going            Plan - 05/18/18 0837    Clinical Impression Statement  Pt did well progressing with her exercises> Cues for core engagement with standing rows and extensions. Increase weight tolerated on machine rows and lats. She reports no increase in pain with today's interventions. Some mild cramping with supine bridges.    Personal Factors and Comorbidities  Fitness;Past/Current Experience;Comorbidity 3+;Social Background;Education;Transportation    Comorbidities  Gout, 2 past back surgeries, TKR's, COPD    Examination-Activity Limitations  Bend;Stairs;Squat;Stand;Lift;Transfers;Locomotion Level    Examination-Participation Restrictions  Cleaning    Stability/Clinical Decision Making  Evolving/Moderate complexity    PT Frequency  1x / week    PT Duration  12 weeks    PT Treatment/Interventions  ADLs/Self Care Home Management;Cryotherapy;Electrical Stimulation;Moist Heat;Traction;Ultrasound;Functional mobility training;Patient/family education;Therapeutic exercise;Therapeutic activities;Manual techniques;Dry needling    PT Next Visit Plan  Posterior chain strengthen and core stability       Patient will benefit from skilled therapeutic intervention in order to improve the following deficits and impairments:     Visit Diagnosis: Acute bilateral low back pain with bilateral sciatica  Muscle spasm of back  Difficulty in walking, not elsewhere classified     Problem List Patient Active Problem List   Diagnosis Date Noted  . PAD (peripheral artery disease) (HCC) 04/05/2018  . Moderate persistent asthma  without complication 06/29/2017  . Environmental and seasonal allergies 06/29/2017  . Controlled type 2 diabetes mellitus with diabetic polyneuropathy, without long-term current use of insulin (HCC) 06/29/2017  . Perennial allergic rhinitis 04/08/2017  . Sensorineural hearing loss (SNHL), bilateral 04/08/2017  . Chronic pansinusitis 03/25/2017  . Eustachian tube dysfunction, bilateral 03/25/2017  . Hearing difficulty of both ears  03/03/2017  . 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 Herniation of lumbar intervertebral disc with radiculopathy 10/02/2016    Class: Chronic  . Alopecia areata 08/19/2016  . Chondromalacia of both patellae 06/03/2015    Class: Chronic  . Spinal stenosis, lumbar region, with neurogenic claudication 06/03/2015  . Tobacco use disorder 04/25/2015  . DJD (degenerative joint disease) of knee 01/04/2015  . Hemorrhoid 11/14/2014  . Gout of big toe 07/19/2014  . Essential hypertension 08/14/2013  . Gastroesophageal reflux disease without esophagitis 08/14/2013  . COPD (chronic obstructive pulmonary disease) (HCC) 04/17/2011    Grayce Sessions, PTA 05/18/2018, 8:41 AM  [facility] [address] [place], [place], [address] Phone: [phone]   Fax:  [phone]  Name: Stacy [name] [facility] MRN: [id number] Date of Birth: 26-Aug-1960

## 2018-05-19 ENCOUNTER — Ambulatory Visit: Payer: Medicaid Other | Attending: Internal Medicine | Admitting: Internal Medicine

## 2018-05-19 ENCOUNTER — Encounter: Payer: Self-pay | Admitting: Internal Medicine

## 2018-05-19 DIAGNOSIS — M170 Bilateral primary osteoarthritis of knee: Secondary | ICD-10-CM | POA: Diagnosis not present

## 2018-05-19 DIAGNOSIS — D649 Anemia, unspecified: Secondary | ICD-10-CM | POA: Diagnosis not present

## 2018-05-19 DIAGNOSIS — M51369 Other intervertebral disc degeneration, lumbar region without mention of lumbar back pain or lower extremity pain: Secondary | ICD-10-CM

## 2018-05-19 DIAGNOSIS — E1142 Type 2 diabetes mellitus with diabetic polyneuropathy: Secondary | ICD-10-CM

## 2018-05-19 DIAGNOSIS — Z7901 Long term (current) use of anticoagulants: Secondary | ICD-10-CM | POA: Insufficient documentation

## 2018-05-19 DIAGNOSIS — M48062 Spinal stenosis, lumbar region with neurogenic claudication: Secondary | ICD-10-CM | POA: Diagnosis not present

## 2018-05-19 DIAGNOSIS — M109 Gout, unspecified: Secondary | ICD-10-CM | POA: Insufficient documentation

## 2018-05-19 DIAGNOSIS — J449 Chronic obstructive pulmonary disease, unspecified: Secondary | ICD-10-CM | POA: Diagnosis not present

## 2018-05-19 DIAGNOSIS — K219 Gastro-esophageal reflux disease without esophagitis: Secondary | ICD-10-CM | POA: Diagnosis not present

## 2018-05-19 DIAGNOSIS — M5136 Other intervertebral disc degeneration, lumbar region: Secondary | ICD-10-CM | POA: Insufficient documentation

## 2018-05-19 DIAGNOSIS — I739 Peripheral vascular disease, unspecified: Secondary | ICD-10-CM | POA: Diagnosis not present

## 2018-05-19 DIAGNOSIS — Z7984 Long term (current) use of oral hypoglycemic drugs: Secondary | ICD-10-CM | POA: Insufficient documentation

## 2018-05-19 DIAGNOSIS — J302 Other seasonal allergic rhinitis: Secondary | ICD-10-CM | POA: Diagnosis not present

## 2018-05-19 DIAGNOSIS — Z79899 Other long term (current) drug therapy: Secondary | ICD-10-CM | POA: Insufficient documentation

## 2018-05-19 DIAGNOSIS — F411 Generalized anxiety disorder: Secondary | ICD-10-CM | POA: Diagnosis not present

## 2018-05-19 DIAGNOSIS — F17210 Nicotine dependence, cigarettes, uncomplicated: Secondary | ICD-10-CM | POA: Diagnosis not present

## 2018-05-19 DIAGNOSIS — F17200 Nicotine dependence, unspecified, uncomplicated: Secondary | ICD-10-CM | POA: Diagnosis not present

## 2018-05-19 DIAGNOSIS — I10 Essential (primary) hypertension: Secondary | ICD-10-CM

## 2018-05-19 DIAGNOSIS — E1151 Type 2 diabetes mellitus with diabetic peripheral angiopathy without gangrene: Secondary | ICD-10-CM | POA: Insufficient documentation

## 2018-05-19 DIAGNOSIS — J3089 Other allergic rhinitis: Secondary | ICD-10-CM

## 2018-05-19 MED ORDER — VALSARTAN-HYDROCHLOROTHIAZIDE 160-12.5 MG PO TABS: 1.0000 | ORAL_TABLET | Freq: Every day | ORAL | 6 refills | Status: DC

## 2018-05-19 MED ORDER — BUDESONIDE-FORMOTEROL FUMARATE 80-4.5 MCG/ACT IN AERO: INHALATION_SPRAY | RESPIRATORY_TRACT | 11 refills | Status: DC

## 2018-05-19 MED ORDER — HYDROCHLOROTHIAZIDE 25 MG PO TABS: 25.0000 mg | ORAL_TABLET | Freq: Every day | ORAL | 3 refills | Status: DC

## 2018-05-19 MED ORDER — METHOCARBAMOL 500 MG PO TABS: 500.0000 mg | ORAL_TABLET | Freq: Three times a day (TID) | ORAL | 3 refills | Status: DC | PRN

## 2018-05-19 MED ORDER — PANTOPRAZOLE SODIUM 40 MG PO TBEC: 40.0000 mg | DELAYED_RELEASE_TABLET | Freq: Every day | ORAL | 6 refills | Status: DC

## 2018-05-19 MED ORDER — ALBUTEROL SULFATE (2.5 MG/3ML) 0.083% IN NEBU: 2.5000 mg | INHALATION_SOLUTION | Freq: Four times a day (QID) | RESPIRATORY_TRACT | 6 refills | Status: DC | PRN

## 2018-05-19 MED ORDER — LORATADINE 10 MG PO TABS: 10.0000 mg | ORAL_TABLET | Freq: Every day | ORAL | 11 refills | Status: DC

## 2018-05-19 MED ORDER — SIMVASTATIN 20 MG PO TABS: 20.0000 mg | ORAL_TABLET | Freq: Every day | ORAL | 6 refills | Status: DC

## 2018-05-19 MED ORDER — POTASSIUM CHLORIDE ER 20 MEQ PO TBCR: 20.0000 meq | EXTENDED_RELEASE_TABLET | Freq: Every day | ORAL | 6 refills | Status: DC

## 2018-05-19 MED ORDER — CLOPIDOGREL BISULFATE 75 MG PO TABS: 75.0000 mg | ORAL_TABLET | Freq: Every day | ORAL | 3 refills | Status: DC

## 2018-05-19 MED ORDER — GLIMEPIRIDE 2 MG PO TABS: 2.0000 mg | ORAL_TABLET | Freq: Every day | ORAL | 6 refills | Status: DC

## 2018-05-19 MED ORDER — NICOTINE 14 MG/24HR TD PT24: MEDICATED_PATCH | TRANSDERMAL | 2 refills | Status: DC

## 2018-05-19 NOTE — Progress Notes (Addendum)
Virtual Visit via Telephone Note  I connected with Stacy [name] on 05/19/18 at 8:48 a.m by telephone  from my office and verified that I am speaking with the correct person using two identifiers.  Pt is at home.  Just the patient and myself participated in this telephone encounter.   I discussed the limitations, risks, security and privacy concerns of performing an evaluation and management service by telephone and the availability of in person appointments. I also discussed with the patient that there may be a patient responsible charge related to this service. The patient expressed understanding and agreed to proceed.   History of Present Illness: Pt with hx of HTN, DM with neuropathy, tob dep, HL, PAD, spinal stenosis with neurogenic claudication, COPD, RLS, OA knees, gout, anemia, recurrent sinusitis, receiving allergy shots from Dr. Young Berry   Had bx of scalp lesion about a year and 1/2 to 2 years ago.  She states that that area since then has been sore whenever she combs her hair.  She does not feel any new lesion in the area.  COPD:  Reports compliance with Spiriva and Symbicort.  Does a nebulizer treatment 2-3 times a day. -+ rattling on LT side of chest at nights x 1 mth.  Associated with productive cough of yellow phlegm No increase SOB, fever  DIABETES TYPE 2 Last A1C:   Results for orders placed or performed in visit on 03/30/18  Glucose (CBG)  Result Value Ref Range   POC Glucose 70 70 - 99 mg/dl   Last V6F 6.5 06/3792 Med Adherence:  [x]  Yes, on Amaryl    []  No Medication side effects:  []  Yes    [x]  No Home Monitoring?  [x]  Yes    []  No Home glucose results range:100-114 Diet Adherence: [x]  Yes    []  No Exercise: [x]  Yes, she tries to walk about 5-6 mins then has to sit and rest on her rollator walker    []  No Hypoglycemic episodes?: []  Yes    [x]  No Numbness of the feet? [x]  Yes    []  No Retinopathy hx? []  Yes    []  No Last eye exam: 04/2018.  Prescribed  some allergy eyedrops. Comments:    PAD:  Podiatrist ordered ABI which revealed abnormal toe brachial index bilaterally. Gets cramps in toes Saw vascular surgeon Dr. Clotilde Dieter.  His assessment was that she had some component of small vessel disease given reduced toe pressures.  He did not feel strongly about any intervention at this time.  Patient allergic to aspirin.  Patient encouraged to discontinue smoking.  Plan to see her again in 6 months with repeat ABIs.  Tob dep: 1 pk/wk.  Has not set quit date.  Using the patches QOD  HTN:  - compliant with meds and salt restriction Has a device to check BP.  Reports blood pressure this morning was 112/80. -No chest pains or lower extremity edema.  Spinal stenosis/DDD/OA RT knee: seeing Dr. [name].  Had recent MRI of the lumbar spine that revealed spinal stenosis and DDD.  Patient referred for physical therapy.  She has had 2 sessions which have been helpful so far   Requesting refills on all medications.  Including medication for acid reflux and her allergy medication.  Observations/Objective:    Chemistry      Component Value Date/Time   NA 135 03/18/2018 0252   NA 138 02/11/2018 0923   K 3.6 03/18/2018 0252   CL 102 03/18/2018 0252  CO2 24 03/18/2018 0252   BUN 14 03/18/2018 0252   BUN 12 02/11/2018 0923   CREATININE 0.92 03/18/2018 0252   CREATININE 0.90 03/11/2016 1556      Component Value Date/Time   CALCIUM 9.7 03/18/2018 0252   ALKPHOS 114 11/11/2017 0950   AST 33 11/11/2017 0950   ALT 29 11/11/2017 0950   BILITOT 0.2 11/11/2017 0950     Lab Results  Component Value Date   CHOL 189 11/11/2017   HDL 47 11/11/2017   LDLCALC 99 11/11/2017   TRIG 217 (H) 11/11/2017   CHOLHDL 4.0 11/11/2017   Lab Results  Component Value Date   WBC 7.9 03/18/2018   HGB 11.9 (L) 03/18/2018   HCT 36.9 03/18/2018   MCV 78.3 (L) 03/18/2018   PLT 279 03/18/2018     Assessment and Plan: 1. Type 2 diabetes mellitus with diabetic  polyneuropathy, without long-term current use of insulin (HCC) Reported blood sugars are at goal.  She will continue healthy eating habits.  Encouraged her to continue walking by doing walk/rest cycles - glimepiride (AMARYL) 2 MG tablet; Take 1 tablet (2 mg total) by mouth daily.  Dispense: 30 tablet; Refill: 6  2. Chronic obstructive pulmonary disease, unspecified COPD type (HCC) Continue current inhalers of Symbicort and Spiriva. Given appt with Dr. Delford Field  - albuterol (PROVENTIL) (2.5 MG/3ML) 0.083% nebulizer solution; Take 3 mLs (2.5 mg total) by nebulization every 6 (six) hours as needed for wheezing.  Dispense: 100 vial; Refill: 6 - budesonide-formoterol (SYMBICORT) 80-4.5 MCG/ACT inhaler; INHALE TWO PUFFS BY MOUTH TWICE A DAY  Dispense: 1 Inhaler; Refill: 11  3. Tobacco use disorder Advised to quit.  She is wanting to quit. Recommend that she use the nicotine patches every day rather than every other day and that she set a quit date beyond which she would not purchase any more cigarettes.  Less than 5 minutes spent on counseling - nicotine (NICODERM CQ - DOSED IN MG/24 HOURS) 14 mg/24hr patch; PLACE 1 PATCH ONTO THE SKIN DAILY  Dispense: 30 patch; Refill: 2  4. Essential hypertension Reported home blood pressure reading this morning was at goal.  Encouraged her to continue home blood pressure monitoring with goal being 130/80 or lower. Continue current medications. Advised patient to write down the blood pressure readings.  Goal is 130/80 or lower - hydrochlorothiazide (HYDRODIURIL) 25 MG tablet; Take 1 tablet (25 mg total) by mouth daily.  Dispense: 90 tablet; Refill: 3 - valsartan-hydrochlorothiazide (DIOVAN-HCT) 160-12.5 MG tablet; Take 1 tablet by mouth daily. Must keep upcoming appt for refills.  Dispense: 30 tablet; Refill: 6 - Potassium Chloride ER 20 MEQ TBCR; Take 20 mEq by mouth daily.  Dispense: 30 tablet; Refill: 6  5. PAD (peripheral artery disease) (HCC) Encourage her to  continue walking with walk/rest cycles. Strongly advised to quit smoking. She does not tolerate aspirin.  She is willing to try Plavix.  I have requested that she come to the lab in about 2 weeks after being on the medicine for CBC.  She is agreeable to doing so.  6. Spinal stenosis, lumbar region, with neurogenic claudication 7. Degenerative disc disease, lumbar Currently doing physical therapy. - methocarbamol (ROBAXIN) 500 MG tablet; Take 1 tablet (500 mg total) by mouth every 8 (eight) hours as needed for muscle spasms.  Dispense: 60 tablet; Refill: 3  8. Gastroesophageal reflux disease without esophagitis - pantoprazole (PROTONIX) 40 MG tablet; Take 1 tablet (40 mg total) by mouth daily.  Dispense: 30 tablet; Refill:  6  9. Environmental and seasonal allergies - loratadine (CLARITIN) 10 MG tablet; Take 1 tablet (10 mg total) by mouth daily.  Dispense: 30 tablet; Refill: 11   Follow Up Instructions: F/u in 3 mths    I discussed the assessment and treatment plan with the patient. The patient was provided an opportunity to ask questions and all were answered. The patient agreed with the plan and demonstrated an understanding of the instructions.   The patient was advised to call back or seek an in-person evaluation if the symptoms worsen or if the condition fails to improve as anticipated.  I provided 21 minutes of non-face-to-face time during this encounter.   Jonah Blueeborah Loretha Ure, MD

## 2018-05-20 ENCOUNTER — Telehealth: Payer: Self-pay | Admitting: Internal Medicine

## 2018-05-20 ENCOUNTER — Ambulatory Visit: Payer: Medicaid Other | Admitting: Physical Medicine & Rehabilitation

## 2018-05-20 NOTE — Telephone Encounter (Signed)
Cal;led the patient to schedule the appoint with wright in a month and and dr Laural Benes in 3 months

## 2018-05-23 DIAGNOSIS — J3089 Other allergic rhinitis: Secondary | ICD-10-CM | POA: Diagnosis not present

## 2018-05-23 DIAGNOSIS — J3081 Allergic rhinitis due to animal (cat) (dog) hair and dander: Secondary | ICD-10-CM | POA: Diagnosis not present

## 2018-05-23 DIAGNOSIS — J301 Allergic rhinitis due to pollen: Secondary | ICD-10-CM | POA: Diagnosis not present

## 2018-05-25 ENCOUNTER — Other Ambulatory Visit: Payer: Medicaid Other

## 2018-05-25 ENCOUNTER — Encounter: Payer: Self-pay | Admitting: Physical Therapy

## 2018-05-25 ENCOUNTER — Other Ambulatory Visit: Payer: Self-pay

## 2018-05-25 ENCOUNTER — Ambulatory Visit: Payer: Medicaid Other | Admitting: Physical Therapy

## 2018-05-25 DIAGNOSIS — M62830 Muscle spasm of back: Secondary | ICD-10-CM

## 2018-05-25 DIAGNOSIS — M25562 Pain in left knee: Secondary | ICD-10-CM | POA: Diagnosis not present

## 2018-05-25 DIAGNOSIS — M25561 Pain in right knee: Secondary | ICD-10-CM | POA: Diagnosis not present

## 2018-05-25 DIAGNOSIS — M5442 Lumbago with sciatica, left side: Secondary | ICD-10-CM

## 2018-05-25 DIAGNOSIS — M5441 Lumbago with sciatica, right side: Secondary | ICD-10-CM

## 2018-05-25 DIAGNOSIS — R262 Difficulty in walking, not elsewhere classified: Secondary | ICD-10-CM

## 2018-05-25 NOTE — Therapy (Signed)
[place], [place], [address] Phone: [phone]   Fax:  [phone]  Physical Therapy Treatment  Patient Details  Name: Stacy [name] [facility] MRN: [id number] Date of Birth: Feb 01, 1961 Referring Provider (PT): [name]   Encounter Date: 05/25/2018  PT End of Session - 05/25/18 0819    Visit Number  4    Number of Visits  4    Date for PT Re-Evaluation  05/25/18    Authorization Type  Medicaid    PT Start Time  9381    PT Stop Time  0838    PT Time Calculation (min)  61 min    Activity Tolerance  Patient tolerated treatment well    Behavior During Therapy  [facility] for tasks assessed/performed       Past Medical History:  Diagnosis Date  . Arthritis   . Arthrofibrosis of total knee replacement ([place])    right  . Asthma   . COPD (chronic obstructive pulmonary disease) ([place])   . Diabetes mellitus    Type II  . GERD (gastroesophageal reflux disease)    Pt on Protonix daily  . Glaucoma   . Gout   . Headache(784.0)    otc meds prn  . Hyperlipidemia   . Hypertension    Pt on lisinopril  . Irritable bowel syndrome 11/19/2010  . Neuropathy   . Pneumonia   . Restless legs   . Shortness of breath    occasional - uses breathing tx at home    Past Surgical History:  Procedure Laterality Date  . CHOLECYSTECTOMY    . COLONOSCOPY    . ENDOMETRIAL ABLATION  10/2010  . HERNIA REPAIR     umbicial hernia  . KNEE ARTHROSCOPY Left    06/07/2017 Dr. Marlou Sa of [facility]  . KNEE CLOSED REDUCTION Right 12/06/2015   Procedure: CLOSED MANIPULATION RIGHT KNEE;  Surgeon: [name], MD;  Location: [place];  Service: Orthopedics;  Laterality: Right;  . KNEE CLOSED REDUCTION Right 01/17/2016   Procedure: CLOSED MANIPULATION RIGHT KNEE;  Surgeon: [name], MD;  Location: [place];  Service: Orthopedics;  Laterality: Right;  . KNEE JOINT MANIPULATION Right 12/06/2015  . LUMBAR DISC SURGERY  06/03/2015   L  2  L3 L4 L5   . LUMBAR LAMINECTOMY/DECOMPRESSION MICRODISCECTOMY N/A 06/03/2015   Procedure: Bilateral lateral recess decompression L2-3, L3-4, L4-5;  Surgeon: [name], MD;  Location: [place];  Service: Orthopedics;  Laterality: N/A;  . LUMBAR LAMINECTOMY/DECOMPRESSION MICRODISCECTOMY N/A 10/02/2016   Procedure: Right L5-S1 Lateral Recess Decompression  microdiscectomy;  Surgeon: [name], MD;  Location: [place];  Service: Orthopedics;  Laterality: N/A;  . svd      x 2  . TOTAL KNEE ARTHROPLASTY Right 09/06/2015   Procedure: RIGHT TOTAL KNEE ARTHROPLASTY;  Surgeon: [name], MD;  Location: [place];  Service: Orthopedics;  Laterality: Right;  . TUBAL LIGATION    . UPPER GASTROINTESTINAL ENDOSCOPY  04/28/11    There were no vitals filed for this visit.  Subjective Assessment - 05/25/18 0745    Subjective  Patient reports a catch in the left hip at times and pain in the back at night    Currently in Pain?  Yes    Pain Score  6     Pain Location  Back    Pain Orientation  Lower    Aggravating Factors   worse with activity and at night  [facility] PT Assessment - 05/25/18 0001      AROM   Overall AROM Comments  Lumbar ROM decresaed  50% still very guarded and with pain                   [facility] Adult PT Treatment/Exercise - 05/25/18 0001      Ambulation/Gait   Gait Comments  using SPC slow gait, antalgic on the left, slightly stooped at the waist      Lumbar Exercises: Aerobic   Nustep  level 4 x 7 minutes      Lumbar Exercises: Machines for Strengthening   Cybex Knee Extension  5lb 3x10     Cybex Knee Flexion  20lb 3x10     Leg Press  20lb 2x10     Other Lumbar Machine Exercise  Rows & Lats 20lb 2x10      Lumbar Exercises: Standing   Row  Strengthening;20 reps;Theraband;Both    Theraband Level (Row)  Level 3 (Green)    Shoulder Extension  Theraband;Power Tower;20 reps;Strengthening    Theraband Level (Shoulder Extension)  Level 3 (Green)      Lumbar  Exercises: Seated   Other Seated Lumbar Exercises  physioball in lap abdominal contraction      Lumbar Exercises: Supine   Other Supine Lumbar Exercises  LE on ball brigges, K2C, Oblq.       Modalities   Modalities  Moist Heat;Electrical Stimulation      Moist Heat Therapy   Number Minutes Moist Heat  15 Minutes    Moist Heat Location  Lumbar Spine      Electrical Stimulation   Electrical Stimulation Location  low back    Electrical Stimulation Action  IFC    Electrical Stimulation Parameters  supine    Electrical Stimulation Goals  Pain               PT Short Term Goals - 05/11/18 0830      PT SHORT TERM GOAL #1   Title  I with initial HEP    Status  On-going        PT Long Term Goals - 05/25/18 5374      PT LONG TERM GOAL #1   Title  I with advanced HEP    Status  Partially Met      PT LONG TERM GOAL #2   Title  increase lumbar ROM 25%    Status  Partially Met      PT LONG TERM GOAL #3   Title  decreased pain 50% with ADLs    Status  Partially Met      PT LONG TERM GOAL #4   Title  report able to go up and down stairs without spasms    Status  On-going            Plan - 05/25/18 0820    Clinical Impression Statement  Patient is working well with the exercises, she does have some pain and requires cues to adjust for pain and cues to do correctly, especially to engage the core mms.  Her walking is still limited with the SPC, antalgic gait and stooped posture.    PT Next Visit Plan  Posterior chain strengtheing and core stability    Consulted and Agree with Plan of Care  Patient       Patient will benefit from skilled therapeutic intervention in order to improve the following deficits and impairments:  Abnormal gait, Decreased range of motion, Difficulty walking, Increased  muscle spasms, Decreased endurance, Cardiopulmonary status limiting activity, Decreased activity tolerance, Pain, Improper body mechanics, Impaired flexibility, Decreased  strength, Postural dysfunction  Visit Diagnosis: Acute bilateral low back pain with bilateral sciatica  Muscle spasm of back  Difficulty in walking, not elsewhere classified     Problem List Patient Active Problem List   Diagnosis Date Noted  . PAD (peripheral artery disease) ([place]) 04/05/2018  . Moderate persistent asthma without complication 26/37/8588  . Environmental and seasonal allergies 06/29/2017  . Controlled type 2 diabetes mellitus with diabetic polyneuropathy, without long-term current use of insulin ([place]) 06/29/2017  . Perennial allergic rhinitis 04/08/2017  . Sensorineural hearing loss (SNHL), bilateral 04/08/2017  . Chronic pansinusitis 03/25/2017  . Eustachian tube dysfunction, bilateral 03/25/2017  . Lichen planopilaris 50/27/7412  . Herniation of lumbar intervertebral disc with radiculopathy 10/02/2016    Class: Chronic  . Alopecia areata 08/19/2016  . Chondromalacia of both patellae 06/03/2015    Class: Chronic  . Spinal stenosis, lumbar region, with neurogenic claudication 06/03/2015  . Tobacco use disorder 04/25/2015  . DJD (degenerative joint disease) of knee 01/04/2015  . Hemorrhoid 11/14/2014  . Gout of big toe 07/19/2014  . Essential hypertension 08/14/2013  . Gastroesophageal reflux disease without esophagitis 08/14/2013  . COPD (chronic obstructive pulmonary disease) ([place]) 04/17/2011    Sumner Boast., PT 05/25/2018, 8:24 AM  [address] [place], [place], [address] Phone: [phone]   Fax:  [phone]  Name: Stacy [name] [facility] MRN: [id number] Date of Birth: 05-22-1960

## 2018-05-26 ENCOUNTER — Ambulatory Visit: Payer: Medicaid Other | Attending: Internal Medicine

## 2018-05-26 DIAGNOSIS — F411 Generalized anxiety disorder: Secondary | ICD-10-CM | POA: Diagnosis not present

## 2018-05-26 DIAGNOSIS — E1142 Type 2 diabetes mellitus with diabetic polyneuropathy: Secondary | ICD-10-CM | POA: Diagnosis not present

## 2018-05-26 DIAGNOSIS — I739 Peripheral vascular disease, unspecified: Secondary | ICD-10-CM | POA: Diagnosis not present

## 2018-05-27 ENCOUNTER — Encounter: Payer: Self-pay | Admitting: Podiatry

## 2018-05-27 ENCOUNTER — Ambulatory Visit: Payer: Medicaid Other | Admitting: Podiatry

## 2018-05-27 ENCOUNTER — Other Ambulatory Visit: Payer: Self-pay

## 2018-05-27 VITALS — Temp 97.7°F

## 2018-05-27 DIAGNOSIS — B351 Tinea unguium: Secondary | ICD-10-CM

## 2018-05-27 DIAGNOSIS — L84 Corns and callosities: Secondary | ICD-10-CM | POA: Diagnosis not present

## 2018-05-27 DIAGNOSIS — E1142 Type 2 diabetes mellitus with diabetic polyneuropathy: Secondary | ICD-10-CM | POA: Diagnosis not present

## 2018-05-27 DIAGNOSIS — M79676 Pain in unspecified toe(s): Secondary | ICD-10-CM

## 2018-05-27 LAB — CBC
Hematocrit: 38.3 % (ref 34.0–46.6)
Hemoglobin: 12.6 g/dL (ref 11.1–15.9)
MCH: 26.5 pg — ABNORMAL LOW (ref 26.6–33.0)
MCHC: 32.9 g/dL (ref 31.5–35.7)
MCV: 81 fL (ref 79–97)
Platelets: 284 10*3/uL (ref 150–450)
RBC: 4.76 x10E6/uL (ref 3.77–5.28)
RDW: 16.1 % — ABNORMAL HIGH (ref 11.7–15.4)
WBC: 6.6 10*3/uL (ref 3.4–10.8)

## 2018-05-27 NOTE — Progress Notes (Signed)
Subjective: Stefan Church Cogliano presents to clinic for preventative diabetic foot care. She has h/o diabetic neuropathy.  She is seen for painful, mycotic toenails b/l feet. Pain is aggravated when wearing enclosed shoe gear and is relieved with periodic professional debridement.  [name], MD is her PCP. Last visit was phone visit on 05/19/2018. She is now on Plavix.    Current Outpatient Medications:  .  ACCU-CHEK AVIVA PLUS test strip, USE 3 TIMES A DAY AS DIRECTED BY PHYSICIAN, Disp: 100 each, Rfl: 12 .  ACCU-CHEK SOFTCLIX LANCETS lancets, USE AS INSTRUCTED 3 TIMES A DAY, Disp: 100 each, Rfl: 12 .  albuterol (PROVENTIL) (2.5 MG/3ML) 0.083% nebulizer solution, Take 3 mLs (2.5 mg total) by nebulization every 6 (six) hours as needed for wheezing., Disp: 100 vial, Rfl: 6 .  allopurinol (ZYLOPRIM) 100 MG tablet, Take 1 tablet (100 mg total) by mouth daily., Disp: 90 tablet, Rfl: 3 .  Blood Glucose Monitoring Suppl (ACCU-CHEK AVIVA PLUS) w/Device KIT, 1 each by Does not apply route 3 (three) times daily., Disp: 1 kit, Rfl: 0 .  budesonide-formoterol (SYMBICORT) 80-4.5 MCG/ACT inhaler, INHALE TWO PUFFS BY MOUTH TWICE A DAY, Disp: 1 Inhaler, Rfl: 11 .  celecoxib (CELEBREX) 200 MG capsule, Take 1 capsule (200 mg total) by mouth 2 (two) times daily., Disp: 180 capsule, Rfl: 3 .  clopidogrel (PLAVIX) 75 MG tablet, Take 1 tablet (75 mg total) by mouth daily., Disp: 30 tablet, Rfl: 3 .  cromolyn (OPTICROM) 4 % ophthalmic solution, INSTILL 1 DROP INTO BOTH EYES FOUR TIMES A DAY AS DIRECTED, Disp: , Rfl:  .  diclofenac sodium (VOLTAREN) 1 % GEL, Apply 4 g topically 4 (four) times daily., Disp: 5 Tube, Rfl: 6 .  EPINEPHrine 0.3 mg/0.3 mL IJ SOAJ injection, 0.3 mg IM x 1 PRN for allergic reaction, Disp: 1 Device, Rfl: 1 .  fluticasone (FLONASE) 50 MCG/ACT nasal spray, Place 1 spray into both nostrils daily., Disp: 16 g, Rfl: 6 .  gabapentin (NEURONTIN) 300 MG capsule, Take 1 capsule (300 mg total)  by mouth 3 (three) times daily., Disp: 270 capsule, Rfl: 3 .  glimepiride (AMARYL) 2 MG tablet, Take 1 tablet (2 mg total) by mouth daily., Disp: 30 tablet, Rfl: 6 .  hydrochlorothiazide (HYDRODIURIL) 25 MG tablet, Take 1 tablet (25 mg total) by mouth daily., Disp: 90 tablet, Rfl: 3 .  hydrOXYzine (ATARAX/VISTARIL) 10 MG tablet, TAKE 1 TABLET IN THE MORNING AND NOON AND 2 TABLETS IN THE EVENING AS NEEDED, Disp: 120 tablet, Rfl: 0 .  Lancets (ACCU-CHEK SOFT TOUCH) lancets, Use as instructed, Disp: 100 each, Rfl: 12 .  loratadine (CLARITIN) 10 MG tablet, Take 1 tablet (10 mg total) by mouth daily., Disp: 30 tablet, Rfl: 11 .  Melatonin 3 MG TABS, Take 1 tablet (3 mg total) by mouth at bedtime as needed. (Patient taking differently: Take 3 mg by mouth at bedtime as needed (sleep). ), Disp: 30 tablet, Rfl: 3 .  methocarbamol (ROBAXIN) 500 MG tablet, Take 1 tablet (500 mg total) by mouth every 8 (eight) hours as needed for muscle spasms., Disp: 60 tablet, Rfl: 3 .  MITIGARE 0.6 MG CAPS, Take 1 capsule by mouth daily., Disp: 30 capsule, Rfl: 3 .  montelukast (SINGULAIR) 10 MG tablet, Take 10 mg by mouth daily., Disp: , Rfl:  .  Multiple Vitamin (MULTIVITAMIN WITH MINERALS) TABS tablet, Take 1 tablet by mouth daily., Disp: , Rfl:  .  nicotine (NICODERM CQ - DOSED IN MG/24 HOURS) 14  mg/24hr patch, PLACE 1 PATCH ONTO THE SKIN DAILY, Disp: 30 patch, Rfl: 2 .  pantoprazole (PROTONIX) 40 MG tablet, Take 1 tablet (40 mg total) by mouth daily., Disp: 30 tablet, Rfl: 6 .  Potassium Chloride ER 20 MEQ TBCR, Take 20 mEq by mouth daily., Disp: 30 tablet, Rfl: 6 .  potassium chloride SA (K-DUR) 20 MEQ tablet, Take 20 mEq by mouth daily., Disp: , Rfl:  .  simvastatin (ZOCOR) 20 MG tablet, Take 1 tablet (20 mg total) by mouth at bedtime., Disp: 30 tablet, Rfl: 6 .  sucralfate (CARAFATE) 1 g tablet, Take 1 g by mouth 4 (four) times daily -  with meals and at bedtime., Disp: , Rfl:  .  tiotropium (SPIRIVA HANDIHALER) 18  MCG inhalation capsule, Place 1 capsule (18 mcg total) into inhaler and inhale daily., Disp: 30 capsule, Rfl: 12 .  traMADol (ULTRAM) 50 MG tablet, Take 1 tablet (50 mg total) by mouth every 6 (six) hours as needed., Disp: 21 tablet, Rfl: 0 .  valsartan-hydrochlorothiazide (DIOVAN-HCT) 160-12.5 MG tablet, Take 1 tablet by mouth daily. Must keep upcoming appt for refills., Disp: 30 tablet, Rfl: 6  Allergies  Allergen Reactions  . Other Shortness Of Breath    UNSPECIFIED AGENTS Allergic to perfumes and cleaning products  . Ace Inhibitors Cough and Other (See Comments)       . Aspirin Nausea Only    Vascular Examination: Capillary refill time <4 seconds  x 10 digits.  Dorsalis pedis pulses 1/4 b/l.  Posterior tibial pulses 1/4 b/l left;  0/4 right.  Digital hair absent x 10 digits.  Skin temperature gradient WNL b/l.  Dermatological Examination: Skin with normal turgor, texture and tone b/l.  Toenails 1-5 b/l discolored, thick, dystrophic with subungual debris and pain with palpation to nailbeds due to thickness of nails.  Hyperkeratotic lesion hallux IPJ b/l. No erythema, no edema, no drainage, no flocculence noted.   Musculoskeletal: Muscle strength 5/5 to all LE muscle groups  Neurological: Sensation diminished with 10 gram monofilament.  Vibratory sensation diminished  Assessment: 1. Painful onychomycosis toenails 1-5 b/l 2. Calluses b/l hallux 3. NIDDM with Diabetic neuropathy  Plan: 1. Continue diabetic foot care principles. Literature dispensed on today. 2. Toenails 1-5 b/l were debrided in length and girth without iatrogenic bleeding. 3. Calluses pared b/l hallux utilizing sterile scalpel blade without incident. Corn(s) pared utilizing sterile scalpel blade without incident.  4. Patient to continue soft, supportive shoe gear 5. Patient to report any pedal injuries to medical professional immediately. 6. Follow up 3 months.  7. Patient/POA to call should there  be a concern in the interim.

## 2018-05-27 NOTE — Patient Instructions (Signed)
Diabetes Mellitus and Foot Care °Foot care is an important part of your health, especially when you have diabetes. Diabetes may cause you to have problems because of poor blood flow (circulation) to your feet and legs, which can cause your skin to: °· Become thinner and drier. °· Break more easily. °· Heal more slowly. °· Peel and crack. °You may also have nerve damage (neuropathy) in your legs and feet, causing decreased feeling in them. This means that you may not notice minor injuries to your feet that could lead to more serious problems. Noticing and addressing any potential problems early is the best way to prevent future foot problems. °How to care for your feet °Foot hygiene °· Wash your feet daily with warm water and mild soap. Do not use hot water. Then, pat your feet and the areas between your toes until they are completely dry. Do not soak your feet as this can dry your skin. °· Trim your toenails straight across. Do not dig under them or around the cuticle. File the edges of your nails with an emery board or nail file. °· Apply a moisturizing lotion or petroleum jelly to the skin on your feet and to dry, brittle toenails. Use lotion that does not contain alcohol and is unscented. Do not apply lotion between your toes. °Shoes and socks °· Wear clean socks or stockings every day. Make sure they are not too tight. Do not wear knee-high stockings since they may decrease blood flow to your legs. °· Wear shoes that fit properly and have enough cushioning. Always look in your shoes before you put them on to be sure there are no objects inside. °· To break in new shoes, wear them for just a few hours a day. This prevents injuries on your feet. °Wounds, scrapes, corns, and calluses °· Check your feet daily for blisters, cuts, bruises, sores, and redness. If you cannot see the bottom of your feet, use a mirror or ask someone for help. °· Do not cut corns or calluses or try to remove them with medicine. °· If you  find a minor scrape, cut, or break in the skin on your feet, keep it and the skin around it clean and dry. You may clean these areas with mild soap and water. Do not clean the area with peroxide, alcohol, or iodine. °· If you have a wound, scrape, corn, or callus on your foot, look at it several times a day to make sure it is healing and not infected. Check for: °? Redness, swelling, or pain. °? Fluid or blood. °? Warmth. °? Pus or a bad smell. °General instructions °· Do not cross your legs. This may decrease blood flow to your feet. °· Do not use heating pads or hot water bottles on your feet. They may burn your skin. If you have lost feeling in your feet or legs, you may not know this is happening until it is too late. °· Protect your feet from hot and cold by wearing shoes, such as at the beach or on hot pavement. °· Schedule a complete foot exam at least once a year (annually) or more often if you have foot problems. If you have foot problems, report any cuts, sores, or bruises to your health care provider immediately. °Contact a health care provider if: °· You have a medical condition that increases your risk of infection and you have any cuts, sores, or bruises on your feet. °· You have an injury that is not   healing. °· You have redness on your legs or feet. °· You feel burning or tingling in your legs or feet. °· You have pain or cramps in your legs and feet. °· Your legs or feet are numb. °· Your feet always feel cold. °· You have pain around a toenail. °Get help right away if: °· You have a wound, scrape, corn, or callus on your foot and: °? You have pain, swelling, or redness that gets worse. °? You have fluid or blood coming from the wound, scrape, corn, or callus. °? Your wound, scrape, corn, or callus feels warm to the touch. °? You have pus or a bad smell coming from the wound, scrape, corn, or callus. °? You have a fever. °? You have a red line going up your leg. °Summary °· Check your feet every day  for cuts, sores, red spots, swelling, and blisters. °· Moisturize feet and legs daily. °· Wear shoes that fit properly and have enough cushioning. °· If you have foot problems, report any cuts, sores, or bruises to your health care provider immediately. °· Schedule a complete foot exam at least once a year (annually) or more often if you have foot problems. °This information is not intended to replace advice given to you by your health care provider. Make sure you discuss any questions you have with your health care provider. °Document Released: 01/17/2000 Document Revised: 03/03/2017 Document Reviewed: 02/21/2016 °Elsevier Interactive Patient Education © 2019 [employer] ° °Diabetic Neuropathy °Diabetic neuropathy refers to nerve damage that is caused by diabetes (diabetes mellitus). Over time, people with diabetes can develop nerve damage throughout the body. There are several types of diabetic neuropathy: °· Peripheral neuropathy. This is the most common type of diabetic neuropathy. It causes damage to nerves that carry signals between the spinal cord and other parts of the body (peripheral nerves). This usually affects nerves in the feet and legs first, and may eventually affect the hands and arms. The damage affects the ability to sense touch or temperature. °· Autonomic neuropathy. This type causes damage to nerves that control involuntary functions (autonomic nerves). These nerves carry signals that control: °? Heartbeat. °? Body temperature. °? Blood pressure. °? Urination. °? Digestion. °? Sweating. °? Sexual function. °? Response to changing blood sugar (glucose) levels. °· Focal neuropathy. This type of nerve damage affects one area of the body, such as an arm, a leg, or the face. The injury may involve one nerve or a small group of nerves. Focal neuropathy can be painful and unpredictable, and occurs most often in older adults with diabetes. This often develops suddenly, but usually improves over time  and does not cause long-term problems. °· Proximal neuropathy. This type of nerve damage affects the nerves of the thighs, hips, buttocks, or legs. It causes severe pain, weakness, and muscle death (atrophy), usually in the thigh muscles. It is more common among older men and people who have type 2 diabetes. The length of recovery time may vary. °What are the causes? °Peripheral, autonomic, and focal neuropathies are caused by diabetes that is not well controlled with treatment. The cause of proximal neuropathy is not known, but it may be caused by inflammation related to uncontrolled blood glucose levels. °What are the signs or symptoms? °Peripheral neuropathy °Peripheral neuropathy develops slowly over time. When the nerves of the feet and legs no longer work, you may experience: °· Burning, stabbing, or aching pain in the legs or feet. °· Pain or cramping in the   legs or feet. °· Loss of feeling (numbness) and inability to feel pressure or pain in the feet. This can lead to: °? Thick calluses or sores on areas of constant pressure. °? Ulcers. °? Reduced ability to feel temperature changes. °· Foot deformities. °· Muscle weakness. °· Loss of balance or coordination. °Autonomic neuropathy °The symptoms of autonomic neuropathy vary depending on which nerves are affected. Symptoms may include: °· Problems with digestion, such as: °? Nausea or vomiting. °? Poor appetite. °? Bloating. °? Diarrhea or constipation. °? Trouble swallowing. °? Losing weight without trying to. °· Problems with the heart, blood and lungs, such as: °? Dizziness, especially when standing up. °? Fainting. °? Shortness of breath. °? Irregular heartbeat. °· Bladder problems, such as: °? Trouble starting or stopping urination. °? Leaking urine. °? Trouble emptying the bladder. °? Urinary tract infections (UTIs). °· Problems with other body functions, such as: °? Sweat. You may sweat too much or too little. °? Temperature. You might get hot easily.  Or, you might feel cold more than usual. °? Sexual function. Men may not be able to get or maintain an erection. Women may have vaginal dryness and difficulty with arousal. °Focal neuropathy °Symptoms affect only one area of the body. Common symptoms include: °· Numbness. °· Tingling. °· Burning pain. °· Prickling feeling. °· Very sensitive skin. °· Weakness. °· Inability to move (paralysis). °· Muscle twitching. °· Muscles getting smaller (wasting). °· Poor coordination. °· Double or blurred vision. °Proximal neuropathy °· Sudden, severe pain in the hip, thigh, or buttocks. Pain may spread from the back into the legs (sciatica). °· Pain and numbness in the arms and legs. °· Tingling. °· Loss of bladder control or bowel control. °· Weakness and wasting of thigh muscles. °· Difficulty getting up from a seated position. °· Abdominal swelling. °· Unexplained weight loss. °How is this diagnosed? °Diagnosis usually involves reviewing your medical history and any symptoms you have. Diagnosis varies depending on the type of neuropathy your health care provider suspects. °Peripheral neuropathy °Your health care provider will check areas that are affected by your nervous system (neurologic exam), such as your reflexes, how you move, and what you can feel. You may have other tests, such as: °· Blood tests. °· Removal and examination of fluid that surrounds the spinal cord (lumbar puncture). °· CT scan. °· MRI. °· A test to check the nerves that control muscles (electromyogram, EMG). °· Tests of how quickly messages pass through your nerves (nerve conduction velocity tests). °· Removal of a small piece of nerve to be examined under a microscope (biopsy). °Autonomic neuropathy °You may have tests, such as: °· Tests to measure your blood pressure and heart rate. This may include monitoring you while you are safely secured to an exam table that moves you from a lying position to an upright position (table tilt test). °· Breathing  tests to check your lungs. °· Tests to check how food moves through the digestive system (gastric emptying tests). °· Blood, sweat, or urine tests. °· Ultrasound of your bladder. °· Spinal fluid tests. °Focal neuropathy °This condition may be diagnosed with: °· A neurologic exam. °· CT scan. °· MRI. °· EMG. °· Nerve conduction velocity tests. °Proximal neuropathy °There is no test to diagnose this type of neuropathy. You may have tests to rule out other possible causes of this type of neuropathy. Tests may include: °· X-rays of your spine and lumbar region. °· Lumbar puncture. °· MRI. °How is this treated? °The   goal of treatment is to keep nerve damage from getting worse. The most important part of treatment is keeping your blood glucose level and your A1C level within your target range by following your diabetes management plan. Over time, maintaining lower blood glucose levels helps lessen symptoms. In some cases, you may need prescription pain medicine. °Follow these instructions at home: ° °Lifestyle ° °· Do not use any products that contain nicotine or tobacco, such as cigarettes and e-cigarettes. If you need help quitting, ask your health care provider. °· Be physically active every day. Include strength training and balance exercises. °· Follow a healthy meal plan. °· Work with your health care provider to manage your blood pressure. °General instructions °· Follow your diabetes management plan as directed. °? Check your blood glucose levels as directed by your health care provider. °? Keep your blood glucose in your target range as directed by your health care provider. °? Have your A1C level checked at least two times a year, or as often as told by your health care provider. °· Take over the counter and prescription medicines only as told by your health care provider. This includes insulin and diabetes medicine. °· Do not drive or use heavy machinery while taking prescription pain medicines. °· Check your  skin and feet every day for cuts, bruises, redness, blisters, or sores. °· Keep all follow up visits as told by your health care provider. This is important. °Contact a health care provider if: °· You have burning, stabbing, or aching pain in your legs or feet. °· You are unable to feel pressure or pain in your feet. °· You develop problems with digestion, such as: °? Nausea. °? Vomiting. °? Bloating. °? Constipation. °? Diarrhea. °? Abdominal pain. °· You have difficulty with urination, such as inability: °? To control when you urinate (incontinence). °? To completely empty the bladder (retention). °· You have palpitations. °· You feel dizzy, weak, or faint when you stand up. °Get help right away if: °· You cannot urinate. °· You have sudden weakness or loss of coordination. °· You have trouble speaking. °· You have pain or pressure in your chest. °· You have an irregular heart beat. °· You have sudden inability to move a part of your body. °Summary °· Diabetic neuropathy refers to nerve damage that is caused by diabetes. It can affect nerves throughout the entire body, causing numbness and pain in the arms, legs, digestive tract, heart, and other body systems. °· Keep your blood glucose level and your blood pressure in your target range, as directed by your health care provider. This can help prevent neuropathy from getting worse. °· Check your skin and feet every day for cuts, bruises, redness, blisters, or sores. °· Do not use any products that contain nicotine or tobacco, such as cigarettes and e-cigarettes. If you need help quitting, ask your health care provider. °This information is not intended to replace advice given to you by your health care provider. Make sure you discuss any questions you have with your health care provider. °Document Released: 03/30/2001 Document Revised: 03/03/2017 Document Reviewed: 02/24/2016 °Elsevier Interactive Patient Education © 2019 [employer] Inc. ° °

## 2018-05-30 DIAGNOSIS — J3089 Other allergic rhinitis: Secondary | ICD-10-CM | POA: Diagnosis not present

## 2018-05-30 DIAGNOSIS — J301 Allergic rhinitis due to pollen: Secondary | ICD-10-CM | POA: Diagnosis not present

## 2018-05-30 DIAGNOSIS — J3081 Allergic rhinitis due to animal (cat) (dog) hair and dander: Secondary | ICD-10-CM | POA: Diagnosis not present

## 2018-06-01 ENCOUNTER — Encounter: Payer: Self-pay | Admitting: Physical Therapy

## 2018-06-01 ENCOUNTER — Other Ambulatory Visit: Payer: Self-pay

## 2018-06-01 ENCOUNTER — Ambulatory Visit: Payer: Medicaid Other | Admitting: Physical Therapy

## 2018-06-01 DIAGNOSIS — R262 Difficulty in walking, not elsewhere classified: Secondary | ICD-10-CM | POA: Diagnosis not present

## 2018-06-01 DIAGNOSIS — M5441 Lumbago with sciatica, right side: Secondary | ICD-10-CM | POA: Diagnosis not present

## 2018-06-01 DIAGNOSIS — M25561 Pain in right knee: Secondary | ICD-10-CM

## 2018-06-01 DIAGNOSIS — M62830 Muscle spasm of back: Secondary | ICD-10-CM

## 2018-06-01 DIAGNOSIS — M5442 Lumbago with sciatica, left side: Secondary | ICD-10-CM

## 2018-06-01 DIAGNOSIS — M25562 Pain in left knee: Secondary | ICD-10-CM | POA: Diagnosis not present

## 2018-06-01 NOTE — Therapy (Signed)
[address] [place] [place], [place], [address] Phone: [phone]   Fax:  [phone]  Physical Therapy Treatment  Patient Details  Name: Stacy [name] [facility] MRN: [id number] Date of Birth: 1960/07/06 Referring Provider (PT): [name]   Encounter Date: 06/01/2018  PT End of Session - 06/01/18 0814    Visit Number  5    Authorization Type  Medicaid    PT Start Time  0740    PT Stop Time  0840    PT Time Calculation (min)  60 min    Activity Tolerance  Patient tolerated treatment well    Behavior During Therapy  [facility] for tasks assessed/performed       Past Medical History:  Diagnosis Date  . Arthritis   . Arthrofibrosis of total knee replacement ([place])    right  . Asthma   . COPD (chronic obstructive pulmonary disease) ([place])   . Diabetes mellitus    Type II  . GERD (gastroesophageal reflux disease)    Pt on Protonix daily  . Glaucoma   . Gout   . Headache(784.0)    otc meds prn  . Hyperlipidemia   . Hypertension    Pt on lisinopril  . Irritable bowel syndrome 11/19/2010  . Neuropathy   . Pneumonia   . Restless legs   . Shortness of breath    occasional - uses breathing tx at home    Past Surgical History:  Procedure Laterality Date  . CHOLECYSTECTOMY    . COLONOSCOPY    . ENDOMETRIAL ABLATION  10/2010  . HERNIA REPAIR     umbicial hernia  . KNEE ARTHROSCOPY Left    06/07/2017 Dr. Marlou Sa of [facility]  . KNEE CLOSED REDUCTION Right 12/06/2015   Procedure: CLOSED MANIPULATION RIGHT KNEE;  Surgeon: [name], MD;  Location: [place];  Service: Orthopedics;  Laterality: Right;  . KNEE CLOSED REDUCTION Right 01/17/2016   Procedure: CLOSED MANIPULATION RIGHT KNEE;  Surgeon: [name], MD;  Location: [place];  Service: Orthopedics;  Laterality: Right;  . KNEE JOINT MANIPULATION Right 12/06/2015  . LUMBAR DISC SURGERY  06/03/2015   L 2  L3 L4 L5   . LUMBAR LAMINECTOMY/DECOMPRESSION  MICRODISCECTOMY N/A 06/03/2015   Procedure: Bilateral lateral recess decompression L2-3, L3-4, L4-5;  Surgeon: [name], MD;  Location: [place];  Service: Orthopedics;  Laterality: N/A;  . LUMBAR LAMINECTOMY/DECOMPRESSION MICRODISCECTOMY N/A 10/02/2016   Procedure: Right L5-S1 Lateral Recess Decompression  microdiscectomy;  Surgeon: [name], MD;  Location: [place];  Service: Orthopedics;  Laterality: N/A;  . svd      x 2  . TOTAL KNEE ARTHROPLASTY Right 09/06/2015   Procedure: RIGHT TOTAL KNEE ARTHROPLASTY;  Surgeon: [name], MD;  Location: [place];  Service: Orthopedics;  Laterality: Right;  . TUBAL LIGATION    . UPPER GASTROINTESTINAL ENDOSCOPY  04/28/11    There were no vitals filed for this visit.  Subjective Assessment - 06/01/18 0751    Subjective  Patient reports that she has ups and downs, feeling a little better right now, but reports knees hurting more now    Currently in Pain?  Yes    Pain Score  4     Pain Location  Knee    Pain Orientation  Right;Left    Pain Descriptors / Indicators  Aching    Aggravating Factors   difficulty sleeping, bending knees and bending over  [facility] PT Treatment/Exercise - 06/01/18 0001      Lumbar Exercises: Aerobic   Recumbent Bike  x 5 minutes slow    Nustep  level 5 x 7 minutes      Lumbar Exercises: Machines for Strengthening   Cybex Knee Extension  5lb 3x10     Cybex Knee Flexion  20lb 3x10     Leg Press  20lb 2x10     Other Lumbar Machine Exercise  Rows & Lats 20lb 2x10      Lumbar Exercises: Standing   Row  Strengthening;20 reps;Theraband;Both      Lumbar Exercises: Supine   Other Supine Lumbar Exercises  LE on ball bridges, K2C, Oblq. and isometric abdominals      Modalities   Modalities  Moist Heat;Electrical Stimulation      Moist Heat Therapy   Number Minutes Moist Heat  15 Minutes    Moist Heat Location  Lumbar Spine      Electrical Stimulation   Electrical Stimulation  Location  low back    Electrical Stimulation Action  IFC    Electrical Stimulation Parameters  supine    Electrical Stimulation Goals  Pain               PT Short Term Goals - 05/11/18 0830      PT SHORT TERM GOAL #1   Title  I with initial HEP    Status  On-going        PT Long Term Goals - 06/01/18 3818      PT LONG TERM GOAL #1   Title  I with advanced HEP    Status  Partially Met      PT LONG TERM GOAL #2   Title  increase lumbar ROM 25%    Status  Partially Met      PT LONG TERM GOAL #3   Title  decreased pain 50% with ADLs    Status  Partially Met      PT LONG TERM GOAL #4   Title  report able to go up and down stairs without spasms    Status  On-going            Plan - 06/01/18 0815    Clinical Impression Statement  Patient is progressing with the exercises, she is still having some back and bilateral knee pain.  She reports mostly difficulty sleeping with her pain, sh eis limited in her ability to walk distances.  She has been able to do some grocery shopping but reports that this "wears" her out.  The ROM of the knees is doing better as witnessed by her going around on the bike    PT Next Visit Plan  we have a new authorization from Medicaid and will work on her strength and function    Consulted and Agree with Plan of Care  Patient       Patient will benefit from skilled therapeutic intervention in order to improve the following deficits and impairments:  Abnormal gait, Decreased range of motion, Difficulty walking, Increased muscle spasms, Decreased endurance, Cardiopulmonary status limiting activity, Decreased activity tolerance, Pain, Improper body mechanics, Impaired flexibility, Decreased strength, Postural dysfunction  Visit Diagnosis: Acute bilateral low back pain with bilateral sciatica  Muscle spasm of back  Difficulty in walking, not elsewhere classified  Acute pain of left knee  Acute pain of right knee     Problem  List Patient Active Problem List   Diagnosis Date Noted  . PAD (  peripheral artery disease) ([place]) 04/05/2018  . Moderate persistent asthma without complication 50/04/7046  . Environmental and seasonal allergies 06/29/2017  . Controlled type 2 diabetes mellitus with diabetic polyneuropathy, without long-term current use of insulin ([place]) 06/29/2017  . Perennial allergic rhinitis 04/08/2017  . Sensorineural hearing loss (SNHL), bilateral 04/08/2017  . Chronic pansinusitis 03/25/2017  . Eustachian tube dysfunction, bilateral 03/25/2017  . Lichen planopilaris 88/91/6945  . Herniation of lumbar intervertebral disc with radiculopathy 10/02/2016    Class: Chronic  . Alopecia areata 08/19/2016  . Chondromalacia of both patellae 06/03/2015    Class: Chronic  . Spinal stenosis, lumbar region, with neurogenic claudication 06/03/2015  . Tobacco use disorder 04/25/2015  . DJD (degenerative joint disease) of knee 01/04/2015  . Hemorrhoid 11/14/2014  . Gout of big toe 07/19/2014  . Essential hypertension 08/14/2013  . Gastroesophageal reflux disease without esophagitis 08/14/2013  . COPD (chronic obstructive pulmonary disease) ([place]) 04/17/2011    Sumner Boast., PT 06/01/2018, 8:22 AM  [address] [place], [place], [address] Phone: [phone]   Fax:  [phone]  Name: Stacy [name] [facility] MRN: [id number] Date of Birth: [birth year]

## 2018-06-02 DIAGNOSIS — F411 Generalized anxiety disorder: Secondary | ICD-10-CM | POA: Diagnosis not present

## 2018-06-03 ENCOUNTER — Ambulatory Visit (INDEPENDENT_AMBULATORY_CARE_PROVIDER_SITE_OTHER): Payer: Medicaid Other | Admitting: Specialist

## 2018-06-03 ENCOUNTER — Other Ambulatory Visit: Payer: Self-pay

## 2018-06-03 ENCOUNTER — Encounter (INDEPENDENT_AMBULATORY_CARE_PROVIDER_SITE_OTHER): Payer: Self-pay | Admitting: Specialist

## 2018-06-03 VITALS — BP 108/69 | HR 86 | Ht 66.0 in | Wt 171.0 lb

## 2018-06-03 DIAGNOSIS — M25562 Pain in left knee: Secondary | ICD-10-CM | POA: Diagnosis not present

## 2018-06-03 DIAGNOSIS — M1712 Unilateral primary osteoarthritis, left knee: Secondary | ICD-10-CM | POA: Diagnosis not present

## 2018-06-03 DIAGNOSIS — R29898 Other symptoms and signs involving the musculoskeletal system: Secondary | ICD-10-CM

## 2018-06-03 DIAGNOSIS — T8484XD Pain due to internal orthopedic prosthetic devices, implants and grafts, subsequent encounter: Secondary | ICD-10-CM | POA: Diagnosis not present

## 2018-06-03 DIAGNOSIS — Z96651 Presence of right artificial knee joint: Secondary | ICD-10-CM

## 2018-06-03 DIAGNOSIS — M48062 Spinal stenosis, lumbar region with neurogenic claudication: Secondary | ICD-10-CM | POA: Diagnosis not present

## 2018-06-03 DIAGNOSIS — M5136 Other intervertebral disc degeneration, lumbar region: Secondary | ICD-10-CM

## 2018-06-03 DIAGNOSIS — G8929 Other chronic pain: Secondary | ICD-10-CM

## 2018-06-03 MED ORDER — HYDROCODONE-ACETAMINOPHEN 5-325 MG PO TABS: 1.0000 | ORAL_TABLET | Freq: Four times a day (QID) | ORAL | 0 refills | Status: DC | PRN

## 2018-06-03 MED ORDER — METHYLPREDNISOLONE ACETATE 40 MG/ML IJ SUSP
40.0000 mg | INTRAMUSCULAR | Status: AC | PRN
  Administered 2018-06-03: 40 mg via INTRA_ARTICULAR

## 2018-06-03 MED ORDER — BUPIVACAINE HCL 0.25 % IJ SOLN
4.0000 mL | INTRAMUSCULAR | Status: AC | PRN
  Administered 2018-06-03: 4 mL via INTRA_ARTICULAR

## 2018-06-03 NOTE — Progress Notes (Addendum)
Office Visit Note   Patient: Stacy [name]           Date of Birth: March 06, 1960           MRN: [id number] Visit Date: 06/03/2018              Requested by: Marcine Matar, MD [address] [place], [place] [address] PCP: Marcine Matar, MD   Assessment & Plan: Visit Diagnoses:  1. Pain due to total right knee replacement, subsequent encounter   2. Chronic pain of left knee   3. Weakness of left leg   4. Spinal stenosis of lumbar region with neurogenic claudication     Plan: Avoid bending, stooping and avoid lifting weights greater than 10 lbs. Avoid prolong standing and walking. Avoid frequent bending and stooping  No lifting greater than 10 lbs. May use ice or moist heat for pain. Weight loss is of benefit. Handicap license is approved. Dr. Chester Blas secretary/Assistant will call to arrange for epidural steroid injection  The main ways of treat osteoarthritis, that are found to be success. Weight loss helps to decrease pain. Exercise is important to maintaining cartilage and thickness and strengthening. NSAIDs like motrin, tylenol, celebrex are meds decreasing the inflamation. Ice is okay  In afternoon and evening and hot shower in the am. You are taking plavix and may need to stop this to have the ESI.   Follow-Up Instructions: Return in about 4 weeks (around 07/01/2018).   Orders:  No orders of the defined types were placed in this encounter.  No orders of the defined types were placed in this encounter.     Procedures: Large Joint Inj: L knee on 06/03/2018 10:29 AM Indications: pain Details: 25 G 1.5 in needle, superolateral approach  Arthrogram: No  Medications: 40 mg methylPREDNISolone acetate 40 MG/ML; 4 mL bupivacaine 0.25 % Outcome: tolerated well, no immediate complications  Bandaid applied. Procedure, treatment alternatives, risks and benefits explained, specific risks discussed. Consent was given by the patient. Immediately prior to  procedure a time out was called to verify the correct patient, procedure, equipment, support staff and site/side marked as required. Patient was prepped and draped in the usual sterile fashion.       Clinical Data: No additional findings.   Subjective: Chief Complaint  Patient presents with  . Lower Back - Follow-up  . Left Leg - Follow-up    58 year old female now almost 3 years post right TKR for severe patellofemoral osteoarthritis, 2 closed manipulations she reports the right right leg is better than the left now. She is having pain and spasm in the left knee and associated left groin pain and left thigh. Difficulty reaching left shoe and socks and trouble going up and down stairs. No bowl or bladder difficulty. Mostly pain pain with kneeling and squatting. Doesn't want to move or do nothing due to left knee and leg pain. Started on plavix for her circulation in her legs by her primary care MD.   Review of Systems  Constitutional: Negative for activity change, appetite change, chills, diaphoresis, fatigue, fever and unexpected weight change.  HENT: Positive for sinus pressure, sinus pain and sore throat. Negative for congestion, dental problem, drooling, ear discharge, ear pain, facial swelling, hearing loss, mouth sores, nosebleeds, postnasal drip, rhinorrhea, sneezing, tinnitus, trouble swallowing and voice change.   Eyes: Positive for visual disturbance (on eye drops for running and changed in Rx for lens). Negative for photophobia, pain, discharge, redness and itching.  Respiratory: Negative.  Negative for apnea, cough, choking, chest tightness, shortness of breath, wheezing and stridor.   Cardiovascular: Negative.  Negative for chest pain, palpitations and leg swelling.  Gastrointestinal: Negative for abdominal distention, abdominal pain, anal bleeding, blood in stool, constipation, diarrhea, nausea, rectal pain and vomiting.  Endocrine: Negative for cold intolerance and heat  intolerance.  Genitourinary: Negative for difficulty urinating, dyspareunia, dysuria, enuresis, flank pain, frequency, genital sores and hematuria.  Musculoskeletal: Negative for arthralgias, back pain, gait problem, joint swelling, myalgias, neck pain and neck stiffness.  Skin: Negative for color change, pallor, rash and wound.  Allergic/Immunologic: Negative for environmental allergies and food allergies.  Neurological: Negative.  Negative for dizziness, tremors, seizures, syncope, facial asymmetry, speech difficulty, weakness, light-headedness, numbness and headaches.  Hematological: Negative for adenopathy. Does not bruise/bleed easily.  Psychiatric/Behavioral: Negative for agitation, behavioral problems, confusion, decreased concentration, dysphoric mood, hallucinations, self-injury, sleep disturbance and suicidal ideas. The patient is not nervous/anxious and is not hyperactive.      Objective: Vital Signs: BP 108/69 (BP Location: Left Arm, Patient Position: Sitting)   Pulse 86   Ht  (1.676 m)   Wt 171 lb (77.6 kg)   BMI 27.60 kg/m   Physical Exam  Right Knee Exam   Range of Motion  Extension: -5  Flexion:  120 normal   Tests  Lachman:  Anterior - negative    Posterior - negative Drawer:  Anterior - negative    Posterior - negative  Other  Erythema: absent Scars: present Sensation: normal Pulse: absent Swelling: moderate   Left Knee Exam   Muscle Strength  The patient has normal left knee strength.  Tenderness  The patient is experiencing tenderness in the patella and patellar tendon.  Range of Motion  Extension: abnormal  Flexion: 110   Tests  McMurray:  Medial - negative Lateral - negative Varus: negative Valgus: negative Lachman:  Anterior - negative    Posterior - negative Drawer:  Anterior - negative     Posterior - negative Pivot shift: negative Patellar apprehension: negative  Other  Erythema: absent Sensation: normal   Back Exam    Tenderness  The patient is experiencing tenderness in the lumbar.  Muscle Strength  Right Quadriceps:  5/5  Left Quadriceps:  4/5  Right Hamstrings:  5/5  Left Hamstrings:  5/5   Reflexes  Patellar: normal Babinski's sign: normal   Other  Gait: antalgic  Erythema: no back redness Scars: present      Specialty Comments:  No specialty comments available.  Imaging: No results found.   PMFS History: Patient Active Problem List   Diagnosis Date Noted  . Herniation of lumbar intervertebral disc with radiculopathy 10/02/2016    Priority: High    Class: Chronic  . Chondromalacia of both patellae 06/03/2015    Priority: High    Class: Chronic  . PAD (peripheral artery disease) (HCC) 04/05/2018  . Moderate persistent asthma without complication 06/29/2017  . Environmental and seasonal allergies 06/29/2017  . Controlled type 2 diabetes mellitus with diabetic polyneuropathy, without long-term current use of insulin (HCC) 06/29/2017  . Perennial allergic rhinitis 04/08/2017  . Sensorineural hearing loss (SNHL), bilateral 04/08/2017  . Chronic pansinusitis 03/25/2017  . Eustachian tube dysfunction, bilateral 03/25/2017  . 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 Alopecia areata 08/19/2016  . Spinal stenosis, lumbar region, with neurogenic claudication 06/03/2015  . Tobacco use disorder 04/25/2015  . DJD (degenerative joint disease) of knee 01/04/2015  . Hemorrhoid 11/14/2014  . Gout of big  toe 07/19/2014  . Essential hypertension 08/14/2013  . Gastroesophageal reflux disease without esophagitis 08/14/2013  . COPD (chronic obstructive pulmonary disease) (HCC) 04/17/2011   Past Medical History:  Diagnosis Date  . Arthritis   . 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 Asthma   . COPD (chronic obstructive pulmonary disease) (HCC)   . Diabetes mellitus    Type II  . GERD (gastroesophageal reflux disease)    Pt on Protonix daily  . Glaucoma   . Gout   .  Headache(784.0)    otc meds prn  . Hyperlipidemia   . Hypertension    Pt on lisinopril  . Irritable bowel syndrome 11/19/2010  . Neuropathy   . Pneumonia   . Restless legs   . Shortness of breath    occasional - uses breathing tx at home    Family History  Problem Relation Age of Onset  . Hypertension Father   . Cancer Father   . Heart disease Mother   . Asthma Son        had as a child  . Heart disease Sister   . Breast cancer Sister   . Hypertension Brother     Past Surgical History:  Procedure Laterality Date  . CHOLECYSTECTOMY    . COLONOSCOPY    . ENDOMETRIAL ABLATION  10/2010  . HERNIA REPAIR     umbicial hernia  . KNEE ARTHROSCOPY Left    06[facility]07[facility]2017 [facility]  . KNEE CLOSED REDUCTION Right 12/06/2015   Procedure: CLOSED MANIPULATION RIGHT KNEE;  Surgeon: Kerrin ChampagneJames E Ameir Faria, MD;  Location: [facility] OR;  Service: Orthopedics;  Laterality: Right;  . KNEE CLOSED REDUCTION Right 01/17/2016   Procedure: CLOSED MANIPULATION RIGHT KNEE;  Surgeon: Kerrin ChampagneJames E Tresa Jolley, MD;  Location: [facility] OR;  Service: Orthopedics;  Laterality: Right;  . KNEE JOINT MANIPULATION Right 12/06/2015  . LUMBAR DISC SURGERY  06/03/2015   L 2  L3 L4 L5   . LUMBAR LAMINECTOMY/DECOMPRESSION MICRODISCECTOMY N/A 06/03/2015   Procedure: Bilateral lateral recess decompression L2-3, L3-4, L4-5;  Surgeon: Kerrin ChampagneJames E Dirk Vanaman, MD;  Location: [facility] OR;  Service: Orthopedics;  Laterality: N/A;  . LUMBAR LAMINECTOMY/DECOMPRESSION MICRODISCECTOMY N/A 10/02/2016   Procedure: Right L5-S1 Lateral Recess Decompression  microdiscectomy;  Surgeon: Kerrin ChampagneNitka, Ceceilia Cephus E, MD;  [facility];  Service: Orthopedics;  Laterality: N/A;  . svd      x 2  . TOTAL KNEE ARTHROPLASTY Right 09/06/2015   Procedure: RIGHT TOTAL KNEE ARTHROPLASTY;  Surgeon: Kerrin ChampagneJames E Parth Mccormac, MD;  Location: [facility] OR;  Service: Orthopedics;  Laterality: Right;  . TUBAL LIGATION    . UPPER GASTROINTESTINAL ENDOSCOPY  04/28/11   Social History   Occupational History  .  Occupation: unemployed    Associate Professormployer: UNEMPLOYED  Tobacco Use  . Smoking status: Current Some Day Smoker    Packs/day: 0.25    Years: 32.00    Pack years: 8.00    Types: Cigarettes  . Smokeless tobacco: Never Used  Substance and Sexual Activity  . Alcohol use: No  . Drug use: No  . Sexual activity: Yes    Birth control/protection: Surgical

## 2018-06-03 NOTE — Patient Instructions (Addendum)
  Plan: Avoid bending, stooping and avoid lifting weights greater than 10 lbs. Avoid prolong standing and walking. Avoid frequent bending and stooping  No lifting greater than 10 lbs. May use ice or moist heat for pain. Weight loss is of benefit. Handicap license is approved. Dr. [place] Blas secretary/Assistant will call to arrange for epidural steroid injection  The main ways of treat osteoarthritis, that are found to be success. Weight loss helps to decrease pain. Exercise is important to maintaining cartilage and thickness and strengthening. NSAIDs like motrin, tylenol, celebrex are meds decreasing the inflamation. Ice is okay  In afternoon and evening and hot shower in the am. You are taking plavix and may need to stop this to have the ESI.

## 2018-06-05 NOTE — Progress Notes (Signed)
Subjective:    Patient ID: Stacy [name], female    DOB: August 19, 1960, 58 y.o.   MRN: [id number]  57 y.o.F referred for Copd evaluation. This patient has a longstanding history of COPD dating back to 2013.  Patient prior to this had significant moderate persistent asthma as well.  The patient is still smoking 3 cigarettes a day but smoked more than this in the past.  The patient currently is complaining of ongoing dyspnea and cough.  She has thick brown mucus which is difficult to raise.  She also has ongoing orthopnea and headaches.  She notes daily wheezing.  The patient maintains Symbicort and Spiriva daily.  Please review shortness of breath assessment below  Note the patient upon review of her HFA technique was quite poor at this  Shortness of Breath  This is a chronic problem. The current episode started more than 1 year ago. The problem occurs daily (walking or at night will wheeze). The problem has been unchanged. Associated symptoms include chest pain, headaches, orthopnea, PND, rhinorrhea, sputum production and wheezing. Pertinent negatives include no fever, hemoptysis, leg swelling, sore throat or syncope. The symptoms are aggravated by any activity, exercise, lying flat, URIs and weather changes. Associated symptoms comments: Coughs up some brown mucus at night. Risk factors include smoking. She has tried beta agonist inhalers, ipratropium inhalers and steroid inhalers for the symptoms. The treatment provided moderate relief. Her past medical history is significant for allergies, aspirin allergies, asthma, COPD and pneumonia. There is no history of CAD, DVT, a heart failure or PE.   Past Medical History:  Diagnosis Date  . Arthritis   . Arthrofibrosis of total knee replacement ([place])    right  . Asthma   . COPD (chronic obstructive pulmonary disease) ([place])   . Diabetes mellitus    Type II  . GERD (gastroesophageal reflux disease)    Pt on Protonix daily  . Glaucoma   .  Gout   . Headache(784.0)    otc meds prn  . Hyperlipidemia   . Hypertension    Pt on lisinopril  . Irritable bowel syndrome 11/19/2010  . Neuropathy   . Pneumonia   . Restless legs   . Shortness of breath    occasional - uses breathing tx at home     Family History  Problem Relation Age of Onset  . Hypertension Father   . Cancer Father   . Heart disease Mother   . Asthma Son        had as a child  . Heart disease Sister   . Breast cancer Sister   . Hypertension Brother      Social History   Socioeconomic History  . Marital status: Married    Spouse name: Not on file  . Number of children: 2  . Years of education: Not on file  . Highest education level: Not on file  Occupational History  . Occupation: unemployed    Fish farm manager: UNEMPLOYED  Social Needs  . Financial resource strain: Not on file  . Food insecurity:    Worry: Not on file    Inability: Not on file  . Transportation needs:    Medical: Not on file    Non-medical: Not on file  Tobacco Use  . Smoking status: Current Some Day Smoker    Packs/day: 0.25    Years: 32.00    Pack years: 8.00    Types: Cigarettes  . Smokeless tobacco: Never Used  Substance and Sexual Activity  .  Alcohol use: No  . Drug use: No  . Sexual activity: Yes    Birth control/protection: Surgical  Lifestyle  . Physical activity:    Days per week: Not on file    Minutes per session: Not on file  . Stress: Not on file  Relationships  . Social connections:    Talks on phone: Not on file    Gets together: Not on file    Attends religious service: Not on file    Active member of club or organization: Not on file    Attends meetings of clubs or organizations: Not on file    Relationship status: Not on file  . Intimate partner violence:    Fear of current or ex partner: Not on file    Emotionally abused: Not on file    Physically abused: Not on file    Forced sexual activity: Not on file  Other Topics Concern  . Not on file   Social History Narrative  . Not on file     Allergies  Allergen Reactions  . Other Shortness Of Breath    UNSPECIFIED AGENTS Allergic to perfumes and cleaning products  . Ace Inhibitors Cough and Other (See Comments)       . Aspirin Nausea Only     Outpatient Medications Prior to Visit  Medication Sig Dispense Refill  . albuterol (PROVENTIL) (2.5 MG/3ML) 0.083% nebulizer solution Take 3 mLs (2.5 mg total) by nebulization every 6 (six) hours as needed for wheezing. 100 vial 6  . allopurinol (ZYLOPRIM) 100 MG tablet Take 1 tablet (100 mg total) by mouth daily. 90 tablet 3  . budesonide-formoterol (SYMBICORT) 80-4.5 MCG/ACT inhaler INHALE TWO PUFFS BY MOUTH TWICE A DAY 1 Inhaler 11  . celecoxib (CELEBREX) 200 MG capsule Take 1 capsule (200 mg total) by mouth 2 (two) times daily. 180 capsule 3  . clopidogrel (PLAVIX) 75 MG tablet Take 1 tablet (75 mg total) by mouth daily. 30 tablet 3  . cromolyn (OPTICROM) 4 % ophthalmic solution INSTILL 1 DROP INTO BOTH EYES FOUR TIMES A DAY AS DIRECTED    . diclofenac sodium (VOLTAREN) 1 % GEL Apply 4 g topically 4 (four) times daily. 5 Tube 6  . fluticasone (FLONASE) 50 MCG/ACT nasal spray Place 1 spray into both nostrils daily. 16 g 6  . gabapentin (NEURONTIN) 300 MG capsule Take 1 capsule (300 mg total) by mouth 3 (three) times daily. 270 capsule 3  . glimepiride (AMARYL) 2 MG tablet Take 1 tablet (2 mg total) by mouth daily. 30 tablet 6  . hydrochlorothiazide (HYDRODIURIL) 25 MG tablet Take 1 tablet (25 mg total) by mouth daily. 90 tablet 3  . HYDROcodone-acetaminophen (NORCO/VICODIN) 5-325 MG tablet Take 1 tablet by mouth every 6 (six) hours as needed. 30 tablet 0  . hydrOXYzine (ATARAX/VISTARIL) 10 MG tablet TAKE 1 TABLET IN THE MORNING AND NOON AND 2 TABLETS IN THE EVENING AS NEEDED 120 tablet 0  . loratadine (CLARITIN) 10 MG tablet Take 1 tablet (10 mg total) by mouth daily. 30 tablet 11  . methocarbamol (ROBAXIN) 500 MG tablet Take 1 tablet  (500 mg total) by mouth every 8 (eight) hours as needed for muscle spasms. 60 tablet 3  . MITIGARE 0.6 MG CAPS Take 1 capsule by mouth daily. 30 capsule 3  . montelukast (SINGULAIR) 10 MG tablet Take 10 mg by mouth daily.    . Multiple Vitamin (MULTIVITAMIN WITH MINERALS) TABS tablet Take 1 tablet by mouth daily.    Marland Kitchen  nicotine (NICODERM CQ - DOSED IN MG/24 HOURS) 14 mg/24hr patch PLACE 1 PATCH ONTO THE SKIN DAILY 30 patch 2  . pantoprazole (PROTONIX) 40 MG tablet Take 1 tablet (40 mg total) by mouth daily. 30 tablet 6  . Potassium Chloride ER 20 MEQ TBCR Take 20 mEq by mouth daily. 30 tablet 6  . potassium chloride SA (K-DUR) 20 MEQ tablet Take 20 mEq by mouth daily.    . sucralfate (CARAFATE) 1 g tablet Take 1 g by mouth 4 (four) times daily -  with meals and at bedtime.    Marland Kitchen tiotropium (SPIRIVA HANDIHALER) 18 MCG inhalation capsule Place 1 capsule (18 mcg total) into inhaler and inhale daily. 30 capsule 12  . traMADol (ULTRAM) 50 MG tablet Take 1 tablet (50 mg total) by mouth every 6 (six) hours as needed. 21 tablet 0  . valsartan-hydrochlorothiazide (DIOVAN-HCT) 160-12.5 MG tablet Take 1 tablet by mouth daily. Must keep upcoming appt for refills. 30 tablet 6  . ACCU-CHEK AVIVA PLUS test strip USE 3 TIMES A DAY AS DIRECTED BY PHYSICIAN 100 each 12  . ACCU-CHEK SOFTCLIX LANCETS lancets USE AS INSTRUCTED 3 TIMES A DAY 100 each 12  . Blood Glucose Monitoring Suppl (ACCU-CHEK AVIVA PLUS) w/Device KIT 1 each by Does not apply route 3 (three) times daily. 1 kit 0  . EPINEPHrine 0.3 mg/0.3 mL IJ SOAJ injection 0.3 mg IM x 1 PRN for allergic reaction (Patient not taking: Reported on 06/06/2018) 1 Device 1  . Lancets (ACCU-CHEK SOFT TOUCH) lancets Use as instructed 100 each 12  . Melatonin 3 MG TABS Take 1 tablet (3 mg total) by mouth at bedtime as needed. (Patient taking differently: Take 3 mg by mouth at bedtime as needed (sleep). ) 30 tablet 3  . CLARITIN 10 MG CAPS TAKE 1 TABLET (10 MG TOTAL) BY MOUTH  DAILY.    . simvastatin (ZOCOR) 20 MG tablet Take 1 tablet (20 mg total) by mouth at bedtime. (Patient not taking: Reported on 06/06/2018) 30 tablet 6   No facility-administered medications prior to visit.      Review of Systems  Constitutional: Negative for fever.  HENT: Positive for rhinorrhea. Negative for sore throat.   Respiratory: Positive for sputum production, shortness of breath and wheezing. Negative for hemoptysis.   Cardiovascular: Positive for chest pain, orthopnea and PND. Negative for leg swelling and syncope.  Neurological: Positive for headaches.       Objective:   Physical Exam Vitals:   06/06/18 1104  BP: 110/70  Pulse: 100  Resp: 20  Temp: 99.4 F (37.4 C)  TempSrc: Oral  SpO2: 97%  Weight: 184 lb (83.5 kg)    Gen: Pleasant, well-nourished, in no distress,  normal affect  ENT: No lesions,  mouth clear,  oropharynx clear, no postnasal drip  Neck: No JVD, no TMG, no carotid bruits  Lungs: No use of accessory muscles, no dullness to percussion, distant breath sounds and a few expiratory wheezes  Cardiovascular: RRR, heart sounds normal, no murmur or gallops, no peripheral edema  Abdomen: soft and NT, no HSM,  BS normal  Musculoskeletal: No deformities, no cyanosis or clubbing  Neuro: alert, non focal  Skin: Warm, no lesions or rashes  PFTs 05/25/2011 FEV1  2.75 (103%) ratio 73 and DLC0 58 corrects to 67% c/w GOLD 0  07/2017:  CXR: NAD.        Assessment & Plan:  I personally reviewed all images and lab data in the [facility] system as well  as any outside material available during this office visit and agree with the  radiology impressions.   COPD (chronic obstructive pulmonary disease) Chronic obstructive lung disease with gold stage 0 PFTs in 2013 now with persistent productive cough that is difficult in terms of raising secretions and as well ongoing shortness of breath.  Plan: Continue Symbicort and Spiriva as prescribed note I trained the patient  how to properly use the HFA device and she went from 50% efficiency to 75% efficiency with this training We will also plan on obtaining another set of pulmonary function studies To help with secretion management a flutter valve was prescribed  Moderate persistent asthma without complication There definitely is an asthmatic component to this patient's symptom complex particularly given the patient's allergy history  Tobacco use disorder Ongoing tobacco use The patient received smoking cessation counseling and was encouraged to continue her nicotine patch   Trana was seen today for follow-up.  Diagnoses and all orders for this visit:  Mixed simple and mucopurulent chronic bronchitis ([place]) -     Pulmonary Function Test; Future  Type 2 diabetes mellitus with diabetic polyneuropathy, without long-term current use of insulin (HCC) -     Glucose (CBG) -     Glucose (CBG)  Tobacco use disorder  Moderate persistent asthma without complication  Other orders -     Respiratory Therapy Supplies (FLUTTER) DEVI; Use after breathing treatment 4 times daily

## 2018-06-06 ENCOUNTER — Ambulatory Visit: Payer: Medicaid Other | Attending: Critical Care Medicine | Admitting: Critical Care Medicine

## 2018-06-06 ENCOUNTER — Other Ambulatory Visit: Payer: Self-pay

## 2018-06-06 ENCOUNTER — Encounter: Payer: Self-pay | Admitting: Critical Care Medicine

## 2018-06-06 VITALS — BP 110/70 | HR 100 | Temp 99.4°F | Resp 20 | Wt 184.0 lb

## 2018-06-06 DIAGNOSIS — Z7984 Long term (current) use of oral hypoglycemic drugs: Secondary | ICD-10-CM | POA: Insufficient documentation

## 2018-06-06 DIAGNOSIS — J4540 Moderate persistent asthma, uncomplicated: Secondary | ICD-10-CM

## 2018-06-06 DIAGNOSIS — G2581 Restless legs syndrome: Secondary | ICD-10-CM | POA: Insufficient documentation

## 2018-06-06 DIAGNOSIS — Z886 Allergy status to analgesic agent status: Secondary | ICD-10-CM | POA: Insufficient documentation

## 2018-06-06 DIAGNOSIS — Z79899 Other long term (current) drug therapy: Secondary | ICD-10-CM | POA: Insufficient documentation

## 2018-06-06 DIAGNOSIS — M1990 Unspecified osteoarthritis, unspecified site: Secondary | ICD-10-CM | POA: Diagnosis not present

## 2018-06-06 DIAGNOSIS — E1142 Type 2 diabetes mellitus with diabetic polyneuropathy: Secondary | ICD-10-CM | POA: Insufficient documentation

## 2018-06-06 DIAGNOSIS — R51 Headache: Secondary | ICD-10-CM | POA: Diagnosis not present

## 2018-06-06 DIAGNOSIS — K219 Gastro-esophageal reflux disease without esophagitis: Secondary | ICD-10-CM | POA: Insufficient documentation

## 2018-06-06 DIAGNOSIS — F17210 Nicotine dependence, cigarettes, uncomplicated: Secondary | ICD-10-CM | POA: Diagnosis not present

## 2018-06-06 DIAGNOSIS — M109 Gout, unspecified: Secondary | ICD-10-CM | POA: Insufficient documentation

## 2018-06-06 DIAGNOSIS — F17200 Nicotine dependence, unspecified, uncomplicated: Secondary | ICD-10-CM | POA: Diagnosis not present

## 2018-06-06 DIAGNOSIS — I10 Essential (primary) hypertension: Secondary | ICD-10-CM | POA: Insufficient documentation

## 2018-06-06 DIAGNOSIS — H409 Unspecified glaucoma: Secondary | ICD-10-CM | POA: Diagnosis not present

## 2018-06-06 DIAGNOSIS — Z8249 Family history of ischemic heart disease and other diseases of the circulatory system: Secondary | ICD-10-CM | POA: Insufficient documentation

## 2018-06-06 DIAGNOSIS — J449 Chronic obstructive pulmonary disease, unspecified: Secondary | ICD-10-CM | POA: Insufficient documentation

## 2018-06-06 DIAGNOSIS — E785 Hyperlipidemia, unspecified: Secondary | ICD-10-CM | POA: Diagnosis not present

## 2018-06-06 DIAGNOSIS — Z791 Long term (current) use of non-steroidal anti-inflammatories (NSAID): Secondary | ICD-10-CM | POA: Diagnosis not present

## 2018-06-06 DIAGNOSIS — J418 Mixed simple and mucopurulent chronic bronchitis: Secondary | ICD-10-CM | POA: Diagnosis not present

## 2018-06-06 LAB — GLUCOSE, POCT (MANUAL RESULT ENTRY)
POC Glucose: 64 mg/dl — AB (ref 70–99)
POC Glucose: 136 mg/dl — AB (ref 70–99)

## 2018-06-06 MED ORDER — FLUTTER DEVI: 0 refills | Status: AC

## 2018-06-06 NOTE — Assessment & Plan Note (Addendum)
Chronic obstructive lung disease with gold stage 0 PFTs in 2013 now with persistent productive cough that is difficult in terms of raising secretions and as well ongoing shortness of breath.  Plan: Continue Symbicort and Spiriva as prescribed note I trained the patient how to properly use the HFA device and she went from 50% efficiency to 75% efficiency with this training We will also plan on obtaining another set of pulmonary function studies To help with secretion management a flutter valve was prescribed

## 2018-06-06 NOTE — Assessment & Plan Note (Signed)
Ongoing tobacco use The patient received smoking cessation counseling and was encouraged to continue her nicotine patch

## 2018-06-06 NOTE — Patient Instructions (Signed)
No change in inhaled medications A flutter valve was given use this 4 times a day after your breathing treatments  Focus on smoking cessation using nicotine patch  A lung function test will be obtained in June  Return to see Dr. Delford Field in July

## 2018-06-06 NOTE — Progress Notes (Signed)
Wheezing at night and with lying down Taking breathing treatment  Taking valsartan- HCTZ  And HCTZ 25mg 

## 2018-06-06 NOTE — Assessment & Plan Note (Signed)
There definitely is an asthmatic component to this patient's symptom complex particularly given the patient's allergy history

## 2018-06-07 DIAGNOSIS — J3081 Allergic rhinitis due to animal (cat) (dog) hair and dander: Secondary | ICD-10-CM | POA: Diagnosis not present

## 2018-06-07 DIAGNOSIS — J301 Allergic rhinitis due to pollen: Secondary | ICD-10-CM | POA: Diagnosis not present

## 2018-06-07 DIAGNOSIS — J3089 Other allergic rhinitis: Secondary | ICD-10-CM | POA: Diagnosis not present

## 2018-06-08 ENCOUNTER — Encounter: Payer: Self-pay | Admitting: Physical Therapy

## 2018-06-08 ENCOUNTER — Other Ambulatory Visit: Payer: Self-pay

## 2018-06-08 ENCOUNTER — Ambulatory Visit: Payer: Medicaid Other | Attending: Specialist | Admitting: Physical Therapy

## 2018-06-08 DIAGNOSIS — M25562 Pain in left knee: Secondary | ICD-10-CM | POA: Insufficient documentation

## 2018-06-08 DIAGNOSIS — M5441 Lumbago with sciatica, right side: Secondary | ICD-10-CM | POA: Diagnosis not present

## 2018-06-08 DIAGNOSIS — M25561 Pain in right knee: Secondary | ICD-10-CM | POA: Insufficient documentation

## 2018-06-08 DIAGNOSIS — M5442 Lumbago with sciatica, left side: Secondary | ICD-10-CM | POA: Diagnosis not present

## 2018-06-08 DIAGNOSIS — R262 Difficulty in walking, not elsewhere classified: Secondary | ICD-10-CM | POA: Diagnosis not present

## 2018-06-08 DIAGNOSIS — M25662 Stiffness of left knee, not elsewhere classified: Secondary | ICD-10-CM | POA: Insufficient documentation

## 2018-06-08 DIAGNOSIS — M62830 Muscle spasm of back: Secondary | ICD-10-CM | POA: Insufficient documentation

## 2018-06-08 NOTE — Therapy (Signed)
[place], [place], [address] Phone: [phone]   Fax:  [phone]  Physical Therapy Treatment  Patient Details  Name: Stacy [name] MRN: [id number] Date of Birth: [birth year] Referring Provider (PT): [name]   Encounter Date: 06/08/2018  PT End of Session - 06/08/18 0828    Visit Number  6    Authorization Type  Medicaid    PT Start Time  0745    PT Stop Time  7989    PT Time Calculation (min)  58 min    Activity Tolerance  Patient tolerated treatment well    Behavior During Therapy  [facility] for tasks assessed/performed       Past Medical History:  Diagnosis Date  . Arthritis   . Arthrofibrosis of total knee replacement ([place])    right  . Asthma   . COPD (chronic obstructive pulmonary disease) ([place])   . Diabetes mellitus    Type II  . GERD (gastroesophageal reflux disease)    Pt on Protonix daily  . Glaucoma   . Gout   . Headache(784.0)    otc meds prn  . Hyperlipidemia   . Hypertension    Pt on lisinopril  . Irritable bowel syndrome 11/19/2010  . Neuropathy   . Pneumonia   . Restless legs   . Shortness of breath    occasional - uses breathing tx at home    Past Surgical History:  Procedure Laterality Date  . CHOLECYSTECTOMY    . COLONOSCOPY    . ENDOMETRIAL ABLATION  10/2010  . HERNIA REPAIR     umbicial hernia  . KNEE ARTHROSCOPY Left    06/07/2017 Dr. Marlou Sa of [facility]  . KNEE CLOSED REDUCTION Right 12/06/2015   Procedure: CLOSED MANIPULATION RIGHT KNEE;  Surgeon: [name], MD;  Location: [place];  Service: Orthopedics;  Laterality: Right;  . KNEE CLOSED REDUCTION Right 01/17/2016   Procedure: CLOSED MANIPULATION RIGHT KNEE;  Surgeon: [name], MD;  Location: [place];  Service: Orthopedics;  Laterality: Right;  . KNEE JOINT MANIPULATION Right 12/06/2015  . LUMBAR DISC SURGERY  06/03/2015   L 2  L3 L4 L5   . LUMBAR LAMINECTOMY/DECOMPRESSION MICRODISCECTOMY  N/A 06/03/2015   Procedure: Bilateral lateral recess decompression L2-3, L3-4, L4-5;  Surgeon: [name], MD;  Location: [place];  Service: Orthopedics;  Laterality: N/A;  . LUMBAR LAMINECTOMY/DECOMPRESSION MICRODISCECTOMY N/A 10/02/2016   Procedure: Right L5-S1 Lateral Recess Decompression  microdiscectomy;  Surgeon: [name], MD;  Location: [place];  Service: Orthopedics;  Laterality: N/A;  . svd      x 2  . TOTAL KNEE ARTHROPLASTY Right 09/06/2015   Procedure: RIGHT TOTAL KNEE ARTHROPLASTY;  Surgeon: [name], MD;  Location: [place];  Service: Orthopedics;  Laterality: Right;  . TUBAL LIGATION    . UPPER GASTROINTESTINAL ENDOSCOPY  04/28/11    There were no vitals filed for this visit.  Subjective Assessment - 06/08/18 0743    Subjective  Pt reports that she went to the MD Friday, Reports that the MD said a bone is messed up in her L knee. Also 1-7 disk are collapsed    Currently in Pain?  Yes    Pain Score  6     Pain Location  Back                       [facility] Adult PT Treatment/Exercise -  06/08/18 0001      Lumbar Exercises: Aerobic   Recumbent Bike  x 5 minutes slow   had to stop, reports a painful catch in posterior R knee    Nustep  level 5 x 7 minutes      Lumbar Exercises: Machines for Strengthening   Cybex Knee Extension  5lb 3x10     Cybex Knee Flexion  20lb 3x10     Leg Press  30lb 2x10     Other Lumbar Machine Exercise  Rows 20lb 2x15 & Lats 20lb 2x10      Lumbar Exercises: Seated   Long Arc Quad on Chair  Right;2 sets;10 reps      Lumbar Exercises: Supine   Other Supine Lumbar Exercises  LE on ball bridges, K2C, Oblq. and isometric abdominals      Modalities   Modalities  Moist Heat;Electrical Stimulation      Moist Heat Therapy   Number Minutes Moist Heat  15 Minutes    Moist Heat Location  Lumbar Spine      Electrical Stimulation   Electrical Stimulation Location  low back    Electrical Stimulation Action  IFC    Electrical  Stimulation Parameters  supine    Electrical Stimulation Goals  Pain               PT Short Term Goals - 05/11/18 0830      PT SHORT TERM GOAL #1   Title  I with initial HEP    Status  On-going        PT Long Term Goals - 06/01/18 8115      PT LONG TERM GOAL #1   Title  I with advanced HEP    Status  Partially Met      PT LONG TERM GOAL #2   Title  increase lumbar ROM 25%    Status  Partially Met      PT LONG TERM GOAL #3   Title  decreased pain 50% with ADLs    Status  Partially Met      PT LONG TERM GOAL #4   Title  report able to go up and down stairs without spasms    Status  On-going            Plan - 06/08/18 7262    Clinical Impression Statement  attempted recumbent bike and on the 3rd revolution she reported some R knee pain. "I just locks up on me and I cant move it" Performed some genteel mobes and PROM to R knee. she was able to do a few sets of LAQ and was fine to continues treatment. Increase reps tolerated with seated rows without pain. No issues with LE strengthen exercises after the one incident of pain. Progressed with weight on leg press.    Personal Factors and Comorbidities  Fitness;Past/Current Experience;Comorbidity 3+;Social Background;Education;Transportation    Comorbidities  Gout, 2 past back surgeries, TKR's, COPD    Examination-Activity Limitations  Bend;Stairs;Squat;Stand;Lift;Transfers;Locomotion Level    Examination-Participation Restrictions  Cleaning    PT Frequency  1x / week    PT Duration  12 weeks    PT Treatment/Interventions  ADLs/Self Care Home Management;Cryotherapy;Electrical Stimulation;Moist Heat;Traction;Ultrasound;Functional mobility training;Patient/family education;Therapeutic exercise;Therapeutic activities;Manual techniques;Dry needling    PT Next Visit Plan  we have a new authorization from Medicaid and will work on her strength and function       Patient will benefit from skilled therapeutic intervention  in order to improve the following deficits and  impairments:  Abnormal gait, Decreased range of motion, Difficulty walking, Increased muscle spasms, Decreased endurance, Cardiopulmonary status limiting activity, Decreased activity tolerance, Pain, Improper body mechanics, Impaired flexibility, Decreased strength, Postural dysfunction  Visit Diagnosis: Acute bilateral low back pain with bilateral sciatica  Muscle spasm of back  Difficulty in walking, not elsewhere classified  Acute pain of left knee     Problem List Patient Active Problem List   Diagnosis Date Noted  . PAD (peripheral artery disease) ([place]) 04/05/2018  . Moderate persistent asthma without complication 82/09/1386  . Environmental and seasonal allergies 06/29/2017  . Controlled type 2 diabetes mellitus with diabetic polyneuropathy, without long-term current use of insulin ([place]) 06/29/2017  . Perennial allergic rhinitis 04/08/2017  . Sensorineural hearing loss (SNHL), bilateral 04/08/2017  . Chronic pansinusitis 03/25/2017  . Eustachian tube dysfunction, bilateral 03/25/2017  . Lichen planopilaris 71/95/9747  . Herniation of lumbar intervertebral disc with radiculopathy 10/02/2016    Class: Chronic  . Alopecia areata 08/19/2016  . Chondromalacia of both patellae 06/03/2015    Class: Chronic  . Spinal stenosis, lumbar region, with neurogenic claudication 06/03/2015  . Tobacco use disorder 04/25/2015  . DJD (degenerative joint disease) of knee 01/04/2015  . Hemorrhoid 11/14/2014  . Gout of big toe 07/19/2014  . Essential hypertension 08/14/2013  . Gastroesophageal reflux disease without esophagitis 08/14/2013  . COPD (chronic obstructive pulmonary disease) ([place]) 04/17/2011    Stacy [name], PTA 06/08/2018, 8:32 AM  [place], [place], [address] Phone: [phone]   Fax:  [phone]  Name: Stacy [name] [facility] MRN:  [id number] Date of Birth: 1960/03/10

## 2018-06-09 ENCOUNTER — Ambulatory Visit: Payer: Medicaid Other | Admitting: Physical Medicine & Rehabilitation

## 2018-06-09 DIAGNOSIS — F411 Generalized anxiety disorder: Secondary | ICD-10-CM | POA: Diagnosis not present

## 2018-06-13 DIAGNOSIS — J301 Allergic rhinitis due to pollen: Secondary | ICD-10-CM | POA: Diagnosis not present

## 2018-06-13 DIAGNOSIS — J3081 Allergic rhinitis due to animal (cat) (dog) hair and dander: Secondary | ICD-10-CM | POA: Diagnosis not present

## 2018-06-13 DIAGNOSIS — J3089 Other allergic rhinitis: Secondary | ICD-10-CM | POA: Diagnosis not present

## 2018-06-15 ENCOUNTER — Other Ambulatory Visit: Payer: Self-pay

## 2018-06-15 ENCOUNTER — Encounter: Payer: Self-pay | Admitting: Physical Therapy

## 2018-06-15 ENCOUNTER — Ambulatory Visit: Payer: Medicaid Other | Admitting: Physical Therapy

## 2018-06-15 DIAGNOSIS — M25562 Pain in left knee: Secondary | ICD-10-CM | POA: Diagnosis not present

## 2018-06-15 DIAGNOSIS — M5441 Lumbago with sciatica, right side: Secondary | ICD-10-CM

## 2018-06-15 DIAGNOSIS — M25561 Pain in right knee: Secondary | ICD-10-CM | POA: Diagnosis not present

## 2018-06-15 DIAGNOSIS — R262 Difficulty in walking, not elsewhere classified: Secondary | ICD-10-CM | POA: Diagnosis not present

## 2018-06-15 DIAGNOSIS — M25662 Stiffness of left knee, not elsewhere classified: Secondary | ICD-10-CM | POA: Diagnosis not present

## 2018-06-15 DIAGNOSIS — M62830 Muscle spasm of back: Secondary | ICD-10-CM

## 2018-06-15 DIAGNOSIS — M5442 Lumbago with sciatica, left side: Secondary | ICD-10-CM

## 2018-06-15 NOTE — Therapy (Signed)
[place], [place], [address] Phone: [phone]   Fax:  [phone]  Physical Therapy Treatment  Patient Details  Name: Stacy [name] [facility] MRN: [id number] Date of Birth: 1960/06/20 Referring Provider (PT): [name]   Encounter Date: 06/15/2018  PT End of Session - 06/15/18 0820    Visit Number  7    Date for PT Re-Evaluation  07/25/18    Authorization Type  Medicaid    PT Start Time  7209    PT Stop Time  0845    PT Time Calculation (min)  50 min    Activity Tolerance  Patient tolerated treatment well    Behavior During Therapy  [facility] for tasks assessed/performed       Past Medical History:  Diagnosis Date  . Arthritis   . Arthrofibrosis of total knee replacement ([place])    right  . Asthma   . COPD (chronic obstructive pulmonary disease) ([place])   . Diabetes mellitus    Type II  . GERD (gastroesophageal reflux disease)    Pt on Protonix daily  . Glaucoma   . Gout   . Headache(784.0)    otc meds prn  . Hyperlipidemia   . Hypertension    Pt on lisinopril  . Irritable bowel syndrome 11/19/2010  . Neuropathy   . Pneumonia   . Restless legs   . Shortness of breath    occasional - uses breathing tx at home    Past Surgical History:  Procedure Laterality Date  . CHOLECYSTECTOMY    . COLONOSCOPY    . ENDOMETRIAL ABLATION  10/2010  . HERNIA REPAIR     umbicial hernia  . KNEE ARTHROSCOPY Left    06/07/2017 Dr. Marlou Sa of [facility]  . KNEE CLOSED REDUCTION Right 12/06/2015   Procedure: CLOSED MANIPULATION RIGHT KNEE;  Surgeon: [name], MD;  Location: [place];  Service: Orthopedics;  Laterality: Right;  . KNEE CLOSED REDUCTION Right 01/17/2016   Procedure: CLOSED MANIPULATION RIGHT KNEE;  Surgeon: [name], MD;  Location: [place];  Service: Orthopedics;  Laterality: Right;  . KNEE JOINT MANIPULATION Right 12/06/2015  . LUMBAR DISC SURGERY  06/03/2015   L 2  L3 L4 L5   . LUMBAR  LAMINECTOMY/DECOMPRESSION MICRODISCECTOMY N/A 06/03/2015   Procedure: Bilateral lateral recess decompression L2-3, L3-4, L4-5;  Surgeon: [name], MD;  Location: [place];  Service: Orthopedics;  Laterality: N/A;  . LUMBAR LAMINECTOMY/DECOMPRESSION MICRODISCECTOMY N/A 10/02/2016   Procedure: Right L5-S1 Lateral Recess Decompression  microdiscectomy;  Surgeon: [name], MD;  Location: [place];  Service: Orthopedics;  Laterality: N/A;  . svd      x 2  . TOTAL KNEE ARTHROPLASTY Right 09/06/2015   Procedure: RIGHT TOTAL KNEE ARTHROPLASTY;  Surgeon: [name], MD;  Location: [place];  Service: Orthopedics;  Laterality: Right;  . TUBAL LIGATION    . UPPER GASTROINTESTINAL ENDOSCOPY  04/28/11    There were no vitals filed for this visit.  Subjective Assessment - 06/15/18 0806    Subjective  Patient reports increased pain today in the left knee and the back, she reports that the cortisone injection has not helped, reports that she will have an epidural injection next week    Currently in Pain?  Yes    Pain Score  7     Pain Location  Back   left knee   Aggravating Factors   reports that just hurts at  times, shooting pain into the knee and the groin on the left    Pain Relieving Factors  treatment helps for a awhile                       [facility] Adult PT Treatment/Exercise - 06/15/18 0001      Lumbar Exercises: Aerobic   Nustep  level 5 x 7 minutes      Lumbar Exercises: Machines for Strengthening   Cybex Knee Extension  5lb 3x10 had to stop due to knee pain, c/o catch in the back of the knee    Cybex Knee Flexion  20lb 3x10     Other Lumbar Machine Exercise  Rows 20lb 2x15 & Lats 20lb 2x10      Lumbar Exercises: Seated   Other Seated Lumbar Exercises  partial sit ups with bosu behind her, then black tband lumbar extension      Lumbar Exercises: Supine   Pelvic Tilt  10 reps;5 seconds    Glut Set  10 reps;2 seconds    Dead Bug  1 second;10 reps    Bridge with [employer]  20 reps;2 seconds    Bridge with clamshell  20 reps;2 seconds      Modalities   Modalities  Moist Heat;Electrical Stimulation      Moist Heat Therapy   Number Minutes Moist Heat  15 Minutes    Moist Heat Location  Lumbar Spine      Electrical Stimulation   Electrical Stimulation Location  low back    Electrical Stimulation Action  IFC    Electrical Stimulation Parameters  supine    Electrical Stimulation Goals  Pain               PT Short Term Goals - 05/11/18 0830      PT SHORT TERM GOAL #1   Title  I with initial HEP    Status  On-going        PT Long Term Goals - 06/15/18 0826      PT LONG TERM GOAL #1   Title  I with advanced HEP    Status  Partially Met      PT LONG TERM GOAL #2   Title  increase lumbar ROM 25%    Status  Partially Met      PT LONG TERM GOAL #3   Title  decreased pain 50% with ADLs    Status  Partially Met      PT LONG TERM GOAL #4   Title  report able to go up and down stairs without spasms    Status  On-going            Plan - 06/15/18 4709    Clinical Impression Statement  Patient had some increased left knee pain today with the leg extension, she had pain last week on the bike so we did not do the bike today, she did fine on the NuStep and on the leg curls.  She reports that she had a cortisone injection in the knee recently but the "locking" as she describes it is worse.  She reports that in the next week or so she will be getting an epidural in the back.  She has been doing well overall with the exercises but the last two visits the knee has limited US.    PT Next Visit Plan  see if we can continue withe the PT may need to limit the LE exercises due  to this new pain that she is having    Consulted and Agree with Plan of Care  Patient       Patient will benefit from skilled therapeutic intervention in order to improve the following deficits and impairments:  Abnormal gait, Decreased range of motion, Difficulty  walking, Increased muscle spasms, Decreased endurance, Cardiopulmonary status limiting activity, Decreased activity tolerance, Pain, Improper body mechanics, Impaired flexibility, Decreased strength, Postural dysfunction  Visit Diagnosis: Acute bilateral low back pain with bilateral sciatica  Muscle spasm of back  Difficulty in walking, not elsewhere classified  Acute pain of left knee  Acute pain of right knee  Stiffness of left knee, not elsewhere classified     Problem List Patient Active Problem List   Diagnosis Date Noted  . PAD (peripheral artery disease) ([place]) 04/05/2018  . Moderate persistent asthma without complication 85/63/1497  . Environmental and seasonal allergies 06/29/2017  . Controlled type 2 diabetes mellitus with diabetic polyneuropathy, without long-term current use of insulin ([place]) 06/29/2017  . Perennial allergic rhinitis 04/08/2017  . Sensorineural hearing loss (SNHL), bilateral 04/08/2017  . Chronic pansinusitis 03/25/2017  . Eustachian tube dysfunction, bilateral 03/25/2017  . Lichen planopilaris 02/63/7858  . Herniation of lumbar intervertebral disc with radiculopathy 10/02/2016    Class: Chronic  . Alopecia areata 08/19/2016  . Chondromalacia of both patellae 06/03/2015    Class: Chronic  . Spinal stenosis, lumbar region, with neurogenic claudication 06/03/2015  . Tobacco use disorder 04/25/2015  . DJD (degenerative joint disease) of knee 01/04/2015  . Hemorrhoid 11/14/2014  . Gout of big toe 07/19/2014  . Essential hypertension 08/14/2013  . Gastroesophageal reflux disease without esophagitis 08/14/2013  . COPD (chronic obstructive pulmonary disease) ([place]) 04/17/2011    Sumner Boast., PT 06/15/2018, 8:27 AM  [place] [address] [place] [address] [place], [place], [address] Phone: [phone]   Fax:  [phone]  Name: Stacy [name] [facility] MRN: [id number] Date of Birth:  12-13-1960

## 2018-06-16 DIAGNOSIS — F411 Generalized anxiety disorder: Secondary | ICD-10-CM | POA: Diagnosis not present

## 2018-06-21 ENCOUNTER — Ambulatory Visit (INDEPENDENT_AMBULATORY_CARE_PROVIDER_SITE_OTHER): Payer: Medicaid Other | Admitting: Physical Medicine and Rehabilitation

## 2018-06-21 ENCOUNTER — Telehealth: Payer: Self-pay | Admitting: Radiology

## 2018-06-21 ENCOUNTER — Other Ambulatory Visit: Payer: Self-pay

## 2018-06-21 ENCOUNTER — Ambulatory Visit: Payer: Self-pay

## 2018-06-21 ENCOUNTER — Encounter: Payer: Self-pay | Admitting: Physical Medicine and Rehabilitation

## 2018-06-21 VITALS — BP 114/73 | HR 80

## 2018-06-21 DIAGNOSIS — M5416 Radiculopathy, lumbar region: Secondary | ICD-10-CM | POA: Diagnosis not present

## 2018-06-21 MED ORDER — DEXAMETHASONE SODIUM PHOSPHATE 10 MG/ML IJ SOLN
15.0000 mg | Freq: Once | INTRAMUSCULAR | Status: AC
  Administered 2018-06-21: 15 mg

## 2018-06-21 NOTE — Progress Notes (Signed)
.  Numeric Pain Rating Scale and Functional Assessment Average Pain 9   In the last MONTH (on 0-10 scale) has pain interfered with the following?  1. General activity like being  able to carry out your everyday physical activities such as walking, climbing stairs, carrying groceries, or moving a chair?  Rating(10)   +Driver, +BT(plavix, ok for injection), -Dye Allergies.

## 2018-06-21 NOTE — Telephone Encounter (Signed)
Patient called in asking if anything has been sent in for pain. Was seen my Dr. Alvester Morin today with injection. Somewhat difficult to understand patient on phone due to constantly saying "ouch" while speaking.  CB # [phone]

## 2018-06-22 ENCOUNTER — Ambulatory Visit: Payer: Medicaid Other | Admitting: Physical Therapy

## 2018-06-22 NOTE — Telephone Encounter (Signed)
Agree with that plan, thanks 

## 2018-06-22 NOTE — Telephone Encounter (Signed)
Please advise 

## 2018-06-22 NOTE — Telephone Encounter (Signed)
I called the patient, and she states that the pain was very intense when she got home yesterday and she took a muscle relaxer which did not help. She stated that she only slept about an hour. I asked if she had any Norco, which was prescribed by Dr. [name] on 5/1, and she states that she has about 3 of those left. I asked if they helped with her pain, and she stated that taking that seemed to work pretty well last night and she was able to sleep. I advised her to keep her appointment with Dr. [name] on Friday and with pain management in June. [facility].

## 2018-06-22 NOTE — Progress Notes (Signed)
Stacy [name] - 58 y.o. female MRN [id number]  Date of birth: 07/06/1960  Office Visit Note: Visit Date: 06/21/2018 PCP: Marcine Matar, MD Referred by: Marcine Matar, MD  Subjective: Chief Complaint  Patient presents with  . Lower Back - Pain  . Right Leg - Pain  . Left Leg - Pain   HPI:  Stacy [name] is a 58 y.o. female who comes in today At the request of Dr. [name] for planned left L2 and L3 transforaminal epidural steroid injection.  Patient is having low back pain left more than right bilateral hip and leg pain.  Prior injections were somewhat beneficial.  She is status post lumbar laminectomy at this level.  As noted in the report patient had an easy time of the L2 injection L3 once we were in the foramen she began having a lot of cramping in the leg.  Injection was done without resistance and flow contrast look perfect.  Patient continued to have some cramping when she left the office.  ROS Otherwise per HPI.  Assessment & Plan: Visit Diagnoses:  1. Lumbar radiculopathy     Plan: No additional findings.   Meds & Orders:  Meds ordered this encounter  Medications  . dexamethasone (DECADRON) injection 15 mg    Orders Placed This Encounter  Procedures  . XR C-ARM NO REPORT  . Epidural Steroid injection    Follow-up: Return if symptoms worsen or fail to improve, for [name], M.D..   Procedures: No procedures performed  Lumbosacral Transforaminal Epidural Steroid Injection - Sub-Pedicular Approach with Fluoroscopic Guidance  Patient: Stacy [name]      Date of Birth: 09-07-1960 MRN: [id number] PCP: Marcine Matar, MD      Visit Date: 06/21/2018   Universal Protocol:    Date/Time: 06/21/2018  Consent Given By: the patient  Position: PRONE  Additional Comments: Vital signs were monitored before and after the procedure. Patient was prepped and draped in the usual sterile fashion. The correct patient, procedure,  and site was verified.   Injection Procedure Details:  Procedure Site One Meds Administered:  Meds ordered this encounter  Medications  . dexamethasone (DECADRON) injection 15 mg    Laterality: Left  Location/Site:  L2-L3 L3-L4  Needle size: 22 G  Needle type: Spinal  Needle Placement: Transforaminal  Findings:    -Comments: Excellent flow of contrast along the nerve and into the epidural space.  Procedure Details: After squaring off the end-plates to get a true AP view, the C-arm was positioned so that an oblique view of the foramen as noted above was visualized. The target area is just inferior to the "nose of the scotty dog" or sub pedicular. The soft tissues overlying this structure were infiltrated with 2-3 ml. of 1% Lidocaine without Epinephrine.  The spinal needle was inserted toward the target using a "trajectory" view along the fluoroscope beam.  Under AP and lateral visualization, the needle was advanced so it did not puncture dura and was located close the 6 O'Clock position of the pedical in AP tracterory. Biplanar projections were used to confirm position. Aspiration was confirmed to be negative for CSF and/or blood. A 1-2 ml. volume of Isovue-250 was injected and flow of contrast was noted at each level. Radiographs were obtained for documentation purposes.   After attaining the desired flow of contrast documented above, a 0.5 to 1.0 ml test dose of 0.25% Marcaine was injected into each respective transforaminal space.  The patient  was observed for 90 seconds post injection.  After no sensory deficits were reported, and normal lower extremity motor function was noted,   the above injectate was administered so that equal amounts of the injectate were placed at each foramen (level) into the transforaminal epidural space.   Additional Comments:  No complications occurred, patient did have a lot of cramping when the needle was placed near the foramen at L3.  She  continued to have cramping in the thigh postinjection.  She had good strength was able to ambulate.  No real issues with that other than just really unexpected amount of cramping at that level which may indicate this is her problematic level. Dressing: 2 x 2 sterile gauze and Band-Aid    Post-procedure details: Patient was observed during the procedure. Post-procedure instructions were reviewed.  Patient left the clinic in stable condition.     Clinical History: MRI LUMBAR SPINE WITHOUT CONTRAST  TECHNIQUE: Multiplanar, multisequence MR imaging of the lumbar spine was performed. No intravenous contrast was administered.  COMPARISON:  08/31/2016  FINDINGS: Segmentation: Standard.  Alignment: Unchanged slight retrolisthesis of L2 on L3 and L3 on L4. Lumbar spine straightening.  Vertebrae: No fracture or suspicious osseous lesion. Increased, mild Modic type 1 degenerative endplate changes at L3-4. Similar appearance of moderate type 2 endplate changes at L4-5.  Conus medullaris and cauda equina: Conus extends to the L1-2 level. Conus and cauda equina appear normal.  Paraspinal and other soft tissues: Postoperative changes in the posterior lumbar soft tissues. No fluid collection.  Disc levels:  Disc desiccation throughout the lumbar spine.  L1-2: Mild ligamentum flavum hypertrophy without disc herniation or stenosis.  L2-3: Progressive, moderate disc space narrowing. Prior posterior decompression. Progressive circumferential disc bulging and mild facet arthrosis result in mild bilateral lateral recess stenosis and mild bilateral neural foraminal stenosis without significant spinal stenosis.  L3-4: Progressive, mild-to-moderate disc space narrowing. Prior posterior decompression. Progressive circumferential disc bulging, a broad left subarticular to left foraminal disc protrusion, and mild facet arthrosis result in mild spinal stenosis, mild right and  moderate to severe left lateral recess stenosis, and moderate right and moderate to severe left neural foraminal stenosis. Potential left L3 and left L4 nerve root impingement.  L4-5: Unchanged moderate disc space narrowing. Prior posterior decompression. Circumferential disc bulging and mild facet arthrosis result in moderate bilateral neural foraminal stenosis without significant spinal stenosis, unchanged.  L5-S1: Interval right laminectomy and microdiscectomy. No residual lateral recess or generalized spinal stenosis. Disc bulging and mild facet arthrosis result in moderate right and mild left neural foraminal stenosis, unchanged.  IMPRESSION: 1. Progressive disc degeneration at L2-3 and L3-4, now with moderate to severe left lateral recess and left greater than right neural foraminal stenosis at L3-4. 2. Interval L5-S1 microdiscectomy without residual spinal stenosis. Unchanged right greater than left neural foraminal stenosis. 3. Unchanged moderate bilateral neural foraminal stenosis at L4-5.   Electronically Signed   By: Sebastian AcheAllen  Grady M.D.   On: 04/20/2018 08:28     Objective:  VS:  HT:    WT:   BMI:     BP:114/73  HR:80bpm  TEMP: ( )  RESP:  Physical Exam  Ortho Exam Imaging: Xr C-arm No Report  Result Date: 06/21/2018 Please see Notes tab for imaging impression.

## 2018-06-22 NOTE — Telephone Encounter (Signed)
Can you call and ask, nothing was said yesterday about call in

## 2018-06-22 NOTE — Procedures (Signed)
Lumbosacral Transforaminal Epidural Steroid Injection - Sub-Pedicular Approach with Fluoroscopic Guidance  Patient: Stacy [name]      Date of Birth: 1960-10-02 MRN: [id number] PCP: Marcine Matar, MD      Visit Date: 06/21/2018   Universal Protocol:    Date/Time: 06/21/2018  Consent Given By: the patient  Position: PRONE  Additional Comments: Vital signs were monitored before and after the procedure. Patient was prepped and draped in the usual sterile fashion. The correct patient, procedure, and site was verified.   Injection Procedure Details:  Procedure Site One Meds Administered:  Meds ordered this encounter  Medications  . dexamethasone (DECADRON) injection 15 mg    Laterality: Left  Location/Site:  L2-L3 L3-L4  Needle size: 22 G  Needle type: Spinal  Needle Placement: Transforaminal  Findings:    -Comments: Excellent flow of contrast along the nerve and into the epidural space.  Procedure Details: After squaring off the end-plates to get a true AP view, the C-arm was positioned so that an oblique view of the foramen as noted above was visualized. The target area is just inferior to the "nose of the scotty dog" or sub pedicular. The soft tissues overlying this structure were infiltrated with 2-3 ml. of 1% Lidocaine without Epinephrine.  The spinal needle was inserted toward the target using a "trajectory" view along the fluoroscope beam.  Under AP and lateral visualization, the needle was advanced so it did not puncture dura and was located close the 6 O'Clock position of the pedical in AP tracterory. Biplanar projections were used to confirm position. Aspiration was confirmed to be negative for CSF and/or blood. A 1-2 ml. volume of Isovue-250 was injected and flow of contrast was noted at each level. Radiographs were obtained for documentation purposes.   After attaining the desired flow of contrast documented above, a 0.5 to 1.0 ml test dose of  0.25% Marcaine was injected into each respective transforaminal space.  The patient was observed for 90 seconds post injection.  After no sensory deficits were reported, and normal lower extremity motor function was noted,   the above injectate was administered so that equal amounts of the injectate were placed at each foramen (level) into the transforaminal epidural space.   Additional Comments:  No complications occurred, patient did have a lot of cramping when the needle was placed near the foramen at L3.  She continued to have cramping in the thigh postinjection.  She had good strength was able to ambulate.  No real issues with that other than just really unexpected amount of cramping at that level which may indicate this is her problematic level. Dressing: 2 x 2 sterile gauze and Band-Aid    Post-procedure details: Patient was observed during the procedure. Post-procedure instructions were reviewed.  Patient left the clinic in stable condition.

## 2018-06-23 DIAGNOSIS — F411 Generalized anxiety disorder: Secondary | ICD-10-CM | POA: Diagnosis not present

## 2018-06-24 ENCOUNTER — Ambulatory Visit: Payer: Medicaid Other | Admitting: Specialist

## 2018-06-24 ENCOUNTER — Encounter: Payer: Self-pay | Admitting: Specialist

## 2018-06-24 ENCOUNTER — Other Ambulatory Visit: Payer: Self-pay

## 2018-06-24 VITALS — BP 94/57 | HR 94 | Ht 66.0 in | Wt 184.0 lb

## 2018-06-24 DIAGNOSIS — M5116 Intervertebral disc disorders with radiculopathy, lumbar region: Secondary | ICD-10-CM | POA: Diagnosis not present

## 2018-06-24 DIAGNOSIS — J3089 Other allergic rhinitis: Secondary | ICD-10-CM | POA: Diagnosis not present

## 2018-06-24 DIAGNOSIS — M1712 Unilateral primary osteoarthritis, left knee: Secondary | ICD-10-CM | POA: Diagnosis not present

## 2018-06-24 DIAGNOSIS — J301 Allergic rhinitis due to pollen: Secondary | ICD-10-CM | POA: Diagnosis not present

## 2018-06-24 DIAGNOSIS — M48062 Spinal stenosis, lumbar region with neurogenic claudication: Secondary | ICD-10-CM | POA: Diagnosis not present

## 2018-06-24 DIAGNOSIS — J3081 Allergic rhinitis due to animal (cat) (dog) hair and dander: Secondary | ICD-10-CM | POA: Diagnosis not present

## 2018-06-24 MED ORDER — OXYCODONE-ACETAMINOPHEN 5-325 MG PO TABS: 1.0000 | ORAL_TABLET | ORAL | 0 refills | Status: AC | PRN

## 2018-06-24 NOTE — Progress Notes (Signed)
Office Visit Note   Patient: Stacy [name]           Date of Birth: [birth year]           MRN: [id number] Visit Date: 06/24/2018              Requested by: Marcine Matar, MD [address] [place], [place] [address] PCP: Marcine Matar, MD   Assessment & Plan: Visit Diagnoses:  1. Spinal stenosis of lumbar region with neurogenic claudication   2. Unilateral primary osteoarthritis, left knee     Plan: Avoid bending, stooping and avoid lifting weights greater than 10 lbs. Avoid prolong standing and walking. Order for a new walker with wheels. Surgery scheduling secretary Tivis Ringer, will call you in the next week to schedule for surgery.  Surgery recommended is a left one level lumbar L3-4 microdiscectomy this would be done with a microscope. Take hydrocodone for for pain. Risk of surgery includes risk of infection 1 in 200 patients, bleeding 1/2% chance you would need a transfusion.   Risk to the nerves is one in 10,000. Expect improved walking and standing tolerance. Expect relief of leg pain but numbness may persist depending on the length and degree of pressure that has been present.   Follow-Up Instructions: No follow-ups on file.   Orders:  No orders of the defined types were placed in this encounter.  No orders of the defined types were placed in this encounter.     Procedures: No procedures performed   Clinical Data: No additional findings.   Subjective: Chief Complaint  Patient presents with  . Lower Back - Follow-up    58 year old female with history of back pain and bilateral knee pain and osteoarthritis. She had bilateral knee patellofemoral degenerative changes and mild lumbar spinal stenosis. She had previous bilateral partial hemilaminectomies for multiple level lateral recess stenosis. Her pain is worse in the knees left greater than the right. With right TKR she had an increase in knee pain with post operative arthrofibrosis  and required 2 knee  Manipulations to regain her ROM. No bowel or bladder difficulty. She is in constant motion with sitting she is squirming due to low back pain. She lying down her pain improves and the quick spasms in the back improve with  Lying on the right side the spasm in the left thigh was worsened with the ESI done left L2 and L3. She describes the pain as spasm and during the esi the spasm was severe and it is still there.    Review of Systems  Constitutional: Negative.  Negative for activity change, appetite change, chills, diaphoresis, fatigue, fever and unexpected weight change.  HENT: Negative.  Negative for congestion, dental problem, drooling, ear discharge, ear pain, facial swelling, hearing loss, mouth sores, nosebleeds, postnasal drip, rhinorrhea, sinus pressure, sinus pain, sneezing, sore throat, tinnitus, trouble swallowing and voice change.   Eyes: Negative.   Respiratory: Negative.  Negative for apnea, cough, choking, chest tightness, shortness of breath, wheezing and stridor.   Cardiovascular: Negative for chest pain, palpitations and leg swelling.  Gastrointestinal: Negative.   Endocrine: Negative.   Genitourinary: Negative.   Musculoskeletal: Positive for back pain and gait problem. Negative for arthralgias, joint swelling, myalgias, neck pain and neck stiffness.  Skin: Negative for color change, pallor, rash and wound.  Allergic/Immunologic: Negative for environmental allergies, food allergies and immunocompromised state.  Neurological: Positive for weakness and numbness.  Hematological: Negative for adenopathy. Does not  bruise/bleed easily.  Psychiatric/Behavioral: Negative for agitation, behavioral problems, confusion, decreased concentration, dysphoric mood, hallucinations, self-injury, sleep disturbance and suicidal ideas. The patient is not nervous/anxious and is not hyperactive.      Objective: Vital Signs: BP (!) 94/57   Pulse 94   Ht  (1.676 m)   Wt  184 lb (83.5 kg)   BMI 29.70 kg/m   Physical Exam  Back Exam   Tenderness  The patient is experiencing tenderness in the lumbar.  Range of Motion  Extension: abnormal  Flexion: normal  Lateral bend right: abnormal  Lateral bend left: abnormal  Rotation right: abnormal  Rotation left: abnormal   Muscle Strength  Right Quadriceps:  5/5  Left Quadriceps:  4/5  Right Hamstrings:  5/5  Left Hamstrings:  5/5   Tests  Straight leg raise right: negative Straight leg raise left: negative  Reflexes  Patellar: 0/4 Achilles: 2/4 Babinski's sign: normal   Other  Toe walk: normal Heel walk: normal Sensation: normal Gait: drop-foot  Erythema: no back redness Scars: present      Specialty Comments:  No specialty comments available.  Imaging: No results found.   PMFS History: Patient Active Problem List   Diagnosis Date Noted  . Herniation of lumbar intervertebral disc with radiculopathy 10/02/2016    Priority: High    Class: Chronic  . Chondromalacia of both patellae 06/03/2015    Priority: High    Class: Chronic  . Moderate persistent asthma without complication 06/29/2017  . Environmental and seasonal allergies 06/29/2017  . Controlled type 2 diabetes mellitus with diabetic polyneuropathy, without long-term current use of insulin (HCC) 06/29/2017  . Perennial allergic rhinitis 04/08/2017  . Sensorineural hearing loss (SNHL), bilateral 04/08/2017  . Chronic pansinusitis 03/25/2017  . Eustachian tube dysfunction, bilateral 03/25/2017  . 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 Alopecia areata 08/19/2016  . Spinal stenosis, lumbar region, with neurogenic claudication 06/03/2015  . Tobacco use disorder 04/25/2015  . DJD (degenerative joint disease) of knee 01/04/2015  . Hemorrhoid 11/14/2014  . Gout of big toe 07/19/2014  . Essential hypertension 08/14/2013  . Gastroesophageal reflux disease without esophagitis 08/14/2013  . COPD (chronic obstructive pulmonary  disease) (HCC) 04/17/2011   Past Medical History:  Diagnosis Date  . Arthritis   . 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 Asthma   . COPD (chronic obstructive pulmonary disease) (HCC)   . Diabetes mellitus    Type II  . GERD (gastroesophageal reflux disease)    Pt on Protonix daily  . Glaucoma   . Gout   . Headache(784.0)    otc meds prn  . Hyperlipidemia   . Hypertension    Pt on lisinopril  . Irritable bowel syndrome 11/19/2010  . Neuropathy   . Pneumonia   . Restless legs   . Shortness of breath    occasional - uses breathing tx at home    Family History  Problem Relation Age of Onset  . Hypertension Father   . Cancer Father   . Heart disease Mother   . Asthma Son        had as a child  . Heart disease Sister   . Breast cancer Sister   . Hypertension Brother     Past Surgical History:  Procedure Laterality Date  . CHOLECYSTECTOMY    . COLONOSCOPY    . ENDOMETRIAL ABLATION  10/2010  . HERNIA REPAIR     umbicial hernia  . KNEE ARTHROSCOPY Left  06/07/2017 [facility]  . KNEE CLOSED REDUCTION Right 12/06/2015   Procedure: CLOSED MANIPULATION RIGHT KNEE;  Surgeon: Kerrin ChampagneJames E Nitka, MD;  Location: [facility];  Service: Orthopedics;  Laterality: Right;  . KNEE CLOSED REDUCTION Right 01/17/2016   Procedure: CLOSED MANIPULATION RIGHT KNEE;  Surgeon: Kerrin ChampagneJames E Nitka, MD;  Location: [facility] OR;  Service: Orthopedics;  Laterality: Right;  . KNEE JOINT MANIPULATION Right 12/06/2015  . LUMBAR DISC SURGERY  06/03/2015   L 2  L3 L4 L5   . LUMBAR LAMINECTOMY/DECOMPRESSION MICRODISCECTOMY N/A 06/03/2015   Procedure: Bilateral lateral recess decompression L2-3, L3-4, L4-5;  Surgeon: Kerrin ChampagneJames E Nitka, MD;  Location: [facility];  Service: Orthopedics;  Laterality: N/A;  . LUMBAR LAMINECTOMY/DECOMPRESSION MICRODISCECTOMY N/A 10/02/2016   Procedure: Right L5-S1 Lateral Recess Decompression  microdiscectomy;  Surgeon: Kerrin ChampagneNitka, James E, MD;  [facility];  Service:  Orthopedics;  Laterality: N/A;  . svd      x 2  . TOTAL KNEE ARTHROPLASTY Right 09/06/2015   Procedure: RIGHT TOTAL KNEE ARTHROPLASTY;  Surgeon: Kerrin ChampagneJames E Nitka, MD;  Location: [facility];  Service: Orthopedics;  Laterality: Right;  . TUBAL LIGATION    . UPPER GASTROINTESTINAL ENDOSCOPY  04/28/11   Social History   Occupational History  . Occupation: unemployed    Associate Professormployer: UNEMPLOYED  Tobacco Use  . Smoking status: Current Some Day Smoker    Packs/day: 0.25    Years: 32.00    Pack years: 8.00    Types: Cigarettes  . Smokeless tobacco: Never Used  Substance and Sexual Activity  . Alcohol use: No  . Drug use: No  . Sexual activity: Yes    Birth control/protection: Surgical

## 2018-06-24 NOTE — Patient Instructions (Signed)
Avoid bending, stooping and avoid lifting weights greater than 10 lbs. Avoid prolong standing and walking. Order for a new walker with wheels. Surgery scheduling secretary Tivis Ringer, will call you in the next week to schedule for surgery.  Surgery recommended is a left one level lumbar L3-4 microdiscectomy this would be done with a microscope. Take hydrocodone for for pain. Risk of surgery includes risk of infection 1 in 200 patients, bleeding 1/2% chance you would need a transfusion.   Risk to the nerves is one in 10,000. Expect improved walking and standing tolerance. Expect relief of leg pain but numbness may persist depending on the length and degree of pressure that has been present.

## 2018-06-28 ENCOUNTER — Ambulatory Visit: Payer: Medicaid Other | Admitting: Physical Therapy

## 2018-06-29 DIAGNOSIS — J3081 Allergic rhinitis due to animal (cat) (dog) hair and dander: Secondary | ICD-10-CM | POA: Diagnosis not present

## 2018-06-29 DIAGNOSIS — J3089 Other allergic rhinitis: Secondary | ICD-10-CM | POA: Diagnosis not present

## 2018-06-29 DIAGNOSIS — J301 Allergic rhinitis due to pollen: Secondary | ICD-10-CM | POA: Diagnosis not present

## 2018-06-30 DIAGNOSIS — F411 Generalized anxiety disorder: Secondary | ICD-10-CM | POA: Diagnosis not present

## 2018-07-01 ENCOUNTER — Other Ambulatory Visit: Payer: Self-pay

## 2018-07-01 ENCOUNTER — Encounter: Payer: Self-pay | Admitting: Physical Therapy

## 2018-07-01 ENCOUNTER — Ambulatory Visit: Payer: Medicaid Other | Admitting: Physical Therapy

## 2018-07-01 DIAGNOSIS — M62830 Muscle spasm of back: Secondary | ICD-10-CM

## 2018-07-01 DIAGNOSIS — M25562 Pain in left knee: Secondary | ICD-10-CM | POA: Diagnosis not present

## 2018-07-01 DIAGNOSIS — R262 Difficulty in walking, not elsewhere classified: Secondary | ICD-10-CM | POA: Diagnosis not present

## 2018-07-01 DIAGNOSIS — M5441 Lumbago with sciatica, right side: Secondary | ICD-10-CM

## 2018-07-01 DIAGNOSIS — M25561 Pain in right knee: Secondary | ICD-10-CM | POA: Diagnosis not present

## 2018-07-01 DIAGNOSIS — M25662 Stiffness of left knee, not elsewhere classified: Secondary | ICD-10-CM | POA: Diagnosis not present

## 2018-07-01 DIAGNOSIS — M5442 Lumbago with sciatica, left side: Secondary | ICD-10-CM

## 2018-07-01 NOTE — Therapy (Signed)
[facility] [place], [place], [address] Phone: [phone]   Fax:  [phone]  Physical Therapy Treatment  Patient Details  Name: Stacy [name] [facility] MRN: [id number] Date of Birth: 1960-05-18 Referring Provider (PT): [name]   Encounter Date: 07/01/2018  PT End of Session - 07/01/18 0847    Visit Number  9    Date for PT Re-Evaluation  07/25/18    Authorization Type  Medicaid    PT Start Time  0800    PT Stop Time  0853    PT Time Calculation (min)  53 min    Activity Tolerance  Patient tolerated treatment well    Behavior During Therapy  [facility] for tasks assessed/performed       Past Medical History:  Diagnosis Date  . Arthritis   . Arthrofibrosis of total knee replacement ([place])    right  . Asthma   . COPD (chronic obstructive pulmonary disease) ([place])   . Diabetes mellitus    Type II  . GERD (gastroesophageal reflux disease)    Pt on Protonix daily  . Glaucoma   . Gout   . Headache(784.0)    otc meds prn  . Hyperlipidemia   . Hypertension    Pt on lisinopril  . Irritable bowel syndrome 11/19/2010  . Neuropathy   . Pneumonia   . Restless legs   . Shortness of breath    occasional - uses breathing tx at home    Past Surgical History:  Procedure Laterality Date  . CHOLECYSTECTOMY    . COLONOSCOPY    . ENDOMETRIAL ABLATION  10/2010  . HERNIA REPAIR     umbicial hernia  . KNEE ARTHROSCOPY Left    06/07/2017 Dr. Marlou Sa of [facility]  . KNEE CLOSED REDUCTION Right 12/06/2015   Procedure: CLOSED MANIPULATION RIGHT KNEE;  Surgeon: [name], MD;  Location: [facility];  Service: Orthopedics;  Laterality: Right;  . KNEE CLOSED REDUCTION Right 01/17/2016   Procedure: CLOSED MANIPULATION RIGHT KNEE;  Surgeon: [name], MD;  Location: [place];  Service: Orthopedics;  Laterality: Right;  . KNEE JOINT MANIPULATION Right 12/06/2015  . LUMBAR DISC SURGERY  06/03/2015   L 2  L3 L4 L5   . LUMBAR  LAMINECTOMY/DECOMPRESSION MICRODISCECTOMY N/A 06/03/2015   Procedure: Bilateral lateral recess decompression L2-3, L3-4, L4-5;  Surgeon: [name], MD;  Location: [place];  Service: Orthopedics;  Laterality: N/A;  . LUMBAR LAMINECTOMY/DECOMPRESSION MICRODISCECTOMY N/A 10/02/2016   Procedure: Right L5-S1 Lateral Recess Decompression  microdiscectomy;  Surgeon: [name], MD;  Location: [facility];  Service: Orthopedics;  Laterality: N/A;  . svd      x 2  . TOTAL KNEE ARTHROPLASTY Right 09/06/2015   Procedure: RIGHT TOTAL KNEE ARTHROPLASTY;  Surgeon: [name], MD;  Location: [place];  Service: Orthopedics;  Laterality: Right;  . TUBAL LIGATION    . UPPER GASTROINTESTINAL ENDOSCOPY  04/28/11    There were no vitals filed for this visit.  Subjective Assessment - 07/01/18 0803    Subjective  "Pretty good, I have been having a hard time moving around."    Currently in Pain?  Yes    Pain Location  Leg    Pain Orientation  Left         [facility] PT Assessment - 07/01/18 0001      AROM   Overall AROM Comments  Lumbar ROM decresaed  50% still very guarded and with  pain                   [facility] Adult PT Treatment/Exercise - 07/01/18 0001      Lumbar Exercises: Aerobic   UBE (Upper Arm Bike)  L3 x3 min     Nustep  level 4 x 8 minutes      Lumbar Exercises: Machines for Strengthening   Cybex Knee Extension  5lb 3x10     Cybex Knee Flexion  20lb 3x10     Other Lumbar Machine Exercise  Rows 20lb 3x10 & Lats 20lb 3x10      Modalities   Modalities  Moist Heat;Electrical Stimulation      Moist Heat Therapy   Number Minutes Moist Heat  15 Minutes    Moist Heat Location  Lumbar Spine      Electrical Stimulation   Electrical Stimulation Location  low back    Electrical Stimulation Action  IFC    Electrical Stimulation Parameters  supine    Electrical Stimulation Goals  Pain               PT Short Term Goals - 05/11/18 0830      PT SHORT TERM GOAL #1   Title  I with  initial HEP    Status  On-going        PT Long Term Goals - 06/15/18 0826      PT LONG TERM GOAL #1   Title  I with advanced HEP    Status  Partially Met      PT LONG TERM GOAL #2   Title  increase lumbar ROM 25%    Status  Partially Met      PT LONG TERM GOAL #3   Title  decreased pain 50% with ADLs    Status  Partially Met      PT LONG TERM GOAL #4   Title  report able to go up and down stairs without spasms    Status  On-going            Plan - 07/01/18 0848    Clinical Impression Statement  Pt enters clinic with more pain. She stated she has ben having increase LBP and leg spasms since receiving epidural. She reports a back surgery in the near future. She was compliant with all exercise interventions with light weight. SBA needed with sit to stands. Pt with a noticeable limp throughout treatment session.     Personal Factors and Comorbidities  Fitness;Past/Current Experience;Comorbidity 3+;Social Background;Education;Transportation    Comorbidities  Gout, 2 past back surgeries, TKR's, COPD    Examination-Activity Limitations  Bend;Stairs;Squat;Stand;Lift;Transfers;Locomotion Level    Examination-Participation Restrictions  Cleaning    PT Treatment/Interventions  ADLs/Self Care Home Management;Cryotherapy;Electrical Stimulation;Moist Heat;Traction;Ultrasound;Functional mobility training;Patient/family education;Therapeutic exercise;Therapeutic activities;Manual techniques;Dry needling    PT Next Visit Plan  see if we can continue with the PT may need to limit the LE exercises due to this new pain that she is having       Patient will benefit from skilled therapeutic intervention in order to improve the following deficits and impairments:     Visit Diagnosis: Acute bilateral low back pain with bilateral sciatica  Muscle spasm of back  Difficulty in walking, not elsewhere classified     Problem List Patient Active Problem List   Diagnosis Date Noted  .  Moderate persistent asthma without complication 64/33/2951  . Environmental and seasonal allergies 06/29/2017  . Controlled type 2 diabetes mellitus with diabetic polyneuropathy, without long-term  current use of insulin ([place]) 06/29/2017  . Perennial allergic rhinitis 04/08/2017  . Sensorineural hearing loss (SNHL), bilateral 04/08/2017  . Chronic pansinusitis 03/25/2017  . Eustachian tube dysfunction, bilateral 03/25/2017  . Lichen planopilaris 05/39/7673  . Herniation of lumbar intervertebral disc with radiculopathy 10/02/2016    Class: Chronic  . Alopecia areata 08/19/2016  . Chondromalacia of both patellae 06/03/2015    Class: Chronic  . Spinal stenosis, lumbar region, with neurogenic claudication 06/03/2015  . Tobacco use disorder 04/25/2015  . DJD (degenerative joint disease) of knee 01/04/2015  . Hemorrhoid 11/14/2014  . Gout of big toe 07/19/2014  . Essential hypertension 08/14/2013  . Gastroesophageal reflux disease without esophagitis 08/14/2013  . COPD (chronic obstructive pulmonary disease) ([place]) 04/17/2011    Scot Jun, PTA 07/01/2018, 8:53 AM  [facility] [place], [place], [address] Phone: [phone]   Fax:  [phone]  Name: Stacy [name] [facility] MRN: [id number] Date of Birth: 1960/10/11

## 2018-07-02 ENCOUNTER — Telehealth: Payer: Self-pay | Admitting: Adult Health

## 2018-07-02 NOTE — Telephone Encounter (Signed)
Discussed recent orthopedic visit and  Covid 19 testing at our [facility] site after recent visit. Pt is unable to drive and is going to call back to make an appointment if she is able to find a ride. Gave pt phone number and information about testing.

## 2018-07-04 ENCOUNTER — Other Ambulatory Visit: Payer: Self-pay

## 2018-07-04 ENCOUNTER — Telehealth: Payer: Self-pay | Admitting: Internal Medicine

## 2018-07-04 ENCOUNTER — Encounter: Payer: Medicaid Other | Attending: Physical Medicine & Rehabilitation | Admitting: Physical Medicine & Rehabilitation

## 2018-07-04 ENCOUNTER — Encounter: Payer: Medicaid Other | Admitting: Physical Medicine & Rehabilitation

## 2018-07-04 DIAGNOSIS — Z20828 Contact with and (suspected) exposure to other viral communicable diseases: Secondary | ICD-10-CM | POA: Diagnosis not present

## 2018-07-04 DIAGNOSIS — Z20822 Contact with and (suspected) exposure to covid-19: Secondary | ICD-10-CM

## 2018-07-04 DIAGNOSIS — F411 Generalized anxiety disorder: Secondary | ICD-10-CM | POA: Diagnosis not present

## 2018-07-04 NOTE — Telephone Encounter (Signed)
Kynadi Berkner: MRN:  [id number] showed up in our office and we didnt catch it until seconds minutes before she walked in the door, that she had possible exposure when she saw Dr. [name] last week.   She states she did not have a ride to [facility].  I had my MD   write an order for testing  at the  [facility]  here while she was in the building

## 2018-07-04 NOTE — Addendum Note (Signed)
Addended by: Maryla Morrow A on: 07/04/2018 01:48 PM   Modules accepted: Orders

## 2018-07-05 ENCOUNTER — Telehealth: Payer: Self-pay

## 2018-07-05 LAB — SAR COV2 SEROLOGY (COVID19)AB(IGG),IA: SARS-CoV-2 Ab, IgG: NEGATIVE

## 2018-07-05 NOTE — Telephone Encounter (Signed)
This patient is someone that Dr.Nitka wanted to do back surgery on asap that has Medicaid so we had to get the auth before Sherrie could schedule. I just checked [place] [employer] and it is showing approved. I didn't know if Dr. [name] wanted to book the case himself since its approved or wait on Sherrie.

## 2018-07-06 ENCOUNTER — Ambulatory Visit: Payer: Medicaid Other | Attending: Specialist | Admitting: Physical Therapy

## 2018-07-06 ENCOUNTER — Ambulatory Visit: Payer: Medicaid Other | Admitting: Physical Medicine & Rehabilitation

## 2018-07-06 ENCOUNTER — Encounter: Payer: Self-pay | Admitting: Physical Therapy

## 2018-07-06 ENCOUNTER — Other Ambulatory Visit: Payer: Self-pay

## 2018-07-06 DIAGNOSIS — M5441 Lumbago with sciatica, right side: Secondary | ICD-10-CM | POA: Diagnosis not present

## 2018-07-06 DIAGNOSIS — R262 Difficulty in walking, not elsewhere classified: Secondary | ICD-10-CM | POA: Insufficient documentation

## 2018-07-06 DIAGNOSIS — M62830 Muscle spasm of back: Secondary | ICD-10-CM | POA: Insufficient documentation

## 2018-07-06 DIAGNOSIS — M25561 Pain in right knee: Secondary | ICD-10-CM | POA: Insufficient documentation

## 2018-07-06 DIAGNOSIS — M5442 Lumbago with sciatica, left side: Secondary | ICD-10-CM | POA: Insufficient documentation

## 2018-07-06 DIAGNOSIS — M25562 Pain in left knee: Secondary | ICD-10-CM | POA: Insufficient documentation

## 2018-07-06 NOTE — Therapy (Signed)
[place], [place], [address] Phone: [phone]   Fax:  [phone]  Physical Therapy Treatment  Patient Details  Name: Stacy [name] [facility] MRN: [id number] Date of Birth: February 11, 1960 Referring Provider (PT): [name]   Encounter Date: 07/06/2018  PT End of Session - 07/06/18 0824    Visit Number  10    Date for PT Re-Evaluation  07/25/18    Authorization Type  Medicaid    PT Start Time  0742    PT Stop Time  0837    PT Time Calculation (min)  55 min    Activity Tolerance  Patient tolerated treatment well    Behavior During Therapy  [facility] for tasks assessed/performed       Past Medical History:  Diagnosis Date  . Arthritis   . Arthrofibrosis of total knee replacement ([place])    right  . Asthma   . COPD (chronic obstructive pulmonary disease) ([place])   . Diabetes mellitus    Type II  . GERD (gastroesophageal reflux disease)    Pt on Protonix daily  . Glaucoma   . Gout   . Headache(784.0)    otc meds prn  . Hyperlipidemia   . Hypertension    Pt on lisinopril  . Irritable bowel syndrome 11/19/2010  . Neuropathy   . Pneumonia   . Restless legs   . Shortness of breath    occasional - uses breathing tx at home    Past Surgical History:  Procedure Laterality Date  . CHOLECYSTECTOMY    . COLONOSCOPY    . ENDOMETRIAL ABLATION  10/2010  . HERNIA REPAIR     umbicial hernia  . KNEE ARTHROSCOPY Left    06/07/2017 Dr. Marlou Sa of [facility]  . KNEE CLOSED REDUCTION Right 12/06/2015   Procedure: CLOSED MANIPULATION RIGHT KNEE;  Surgeon: [name], MD;  Location: [place];  Service: Orthopedics;  Laterality: Right;  . KNEE CLOSED REDUCTION Right 01/17/2016   Procedure: CLOSED MANIPULATION RIGHT KNEE;  Surgeon: [name], MD;  Location: [place];  Service: Orthopedics;  Laterality: Right;  . KNEE JOINT MANIPULATION Right 12/06/2015  . LUMBAR DISC SURGERY  06/03/2015   L 2  L3 L4 L5   . LUMBAR  LAMINECTOMY/DECOMPRESSION MICRODISCECTOMY N/A 06/03/2015   Procedure: Bilateral lateral recess decompression L2-3, L3-4, L4-5;  Surgeon: [name], MD;  Location: [place];  Service: Orthopedics;  Laterality: N/A;  . LUMBAR LAMINECTOMY/DECOMPRESSION MICRODISCECTOMY N/A 10/02/2016   Procedure: Right L5-S1 Lateral Recess Decompression  microdiscectomy;  Surgeon: [name], MD;  Location: [place];  Service: Orthopedics;  Laterality: N/A;  . svd      x 2  . TOTAL KNEE ARTHROPLASTY Right 09/06/2015   Procedure: RIGHT TOTAL KNEE ARTHROPLASTY;  Surgeon: [name], MD;  Location: [place];  Service: Orthopedics;  Laterality: Right;  . TUBAL LIGATION    . UPPER GASTROINTESTINAL ENDOSCOPY  04/28/11    There were no vitals filed for this visit.  Subjective Assessment - 07/06/18 0744    Subjective  Patient reports that she was exposed to a someone at the orthopedic office last week.  She got her test done and it was negative.  She was frustrated about having to jump through hoops for other people for this without transportation.  She reports that she feels like she will be scheduled for surgery soon.    Currently in Pain?  Yes    Pain Score  8     Pain Location  Back    Pain Orientation  Lower    Pain Descriptors / Indicators  Aching;Sore    Aggravating Factors   stress, walking, she reports that she cannot do stairs due to the left leg giving out on her                       [facility] Adult PT Treatment/Exercise - 07/06/18 0001      Lumbar Exercises: Aerobic   UBE (Upper Arm Bike)  L3 x3 min     Nustep  level 4 x 8 minutes      Lumbar Exercises: Machines for Strengthening   Cybex Knee Extension  5lb 2x15     Cybex Knee Flexion  25lb 2x15     Leg Press  20lb 2x10     Other Lumbar Machine Exercise  Rows 20lb 3x10 & Lats 20lb 3x10      Lumbar Exercises: Supine   Bridge  20 reps;3 seconds      Modalities   Modalities  Moist Heat;Electrical Stimulation      Moist Heat Therapy    Number Minutes Moist Heat  15 Minutes    Moist Heat Location  Lumbar Spine      Electrical Stimulation   Electrical Stimulation Location  low back    Electrical Stimulation Action  IFC    Electrical Stimulation Parameters  supine    Electrical Stimulation Goals  Pain               PT Short Term Goals - 05/11/18 0830      PT SHORT TERM GOAL #1   Title  I with initial HEP    Status  On-going        PT Long Term Goals - 06/15/18 0826      PT LONG TERM GOAL #1   Title  I with advanced HEP    Status  Partially Met      PT LONG TERM GOAL #2   Title  increase lumbar ROM 25%    Status  Partially Met      PT LONG TERM GOAL #3   Title  decreased pain 50% with ADLs    Status  Partially Met      PT LONG TERM GOAL #4   Title  report able to go up and down stairs without spasms    Status  On-going            Plan - 07/06/18 5456    Clinical Impression Statement  Pt stated that she feels better compared to last treatment . She still reports spasms in her R quad area, this did increase with supine bridges. Increase resistance tolerated with leg press and seated HS curls.     Personal Factors and Comorbidities  Fitness;Past/Current Experience;Comorbidity 3+;Social Background;Education;Transportation    Comorbidities  Gout, 2 past back surgeries, TKR's, COPD    Examination-Activity Limitations  Bend;Stairs;Squat;Stand;Lift;Transfers;Locomotion Level    Examination-Participation Restrictions  Cleaning    Stability/Clinical Decision Making  Evolving/Moderate complexity    PT Duration  12 weeks    PT Treatment/Interventions  ADLs/Self Care Home Management;Cryotherapy;Electrical Stimulation;Moist Heat;Traction;Ultrasound;Functional mobility training;Patient/family education;Therapeutic exercise;Therapeutic activities;Manual techniques;Dry needling    PT Next Visit Plan  see if we can continue with the PT may need to limit the LE exercises due to this new pain that she is  having       Patient will benefit from skilled therapeutic intervention  in order to improve the following deficits and impairments:     Visit Diagnosis: Acute bilateral low back pain with bilateral sciatica  Muscle spasm of back  Difficulty in walking, not elsewhere classified  Acute pain of left knee  Acute pain of right knee     Problem List Patient Active Problem List   Diagnosis Date Noted  . Moderate persistent asthma without complication 83/04/2199  . Environmental and seasonal allergies 06/29/2017  . Controlled type 2 diabetes mellitus with diabetic polyneuropathy, without long-term current use of insulin ([place]) 06/29/2017  . Perennial allergic rhinitis 04/08/2017  . Sensorineural hearing loss (SNHL), bilateral 04/08/2017  . Chronic pansinusitis 03/25/2017  . Eustachian tube dysfunction, bilateral 03/25/2017  . Lichen planopilaris 99/24/1551  . Herniation of lumbar intervertebral disc with radiculopathy 10/02/2016    Class: Chronic  . Alopecia areata 08/19/2016  . Chondromalacia of both patellae 06/03/2015    Class: Chronic  . Spinal stenosis, lumbar region, with neurogenic claudication 06/03/2015  . Tobacco use disorder 04/25/2015  . DJD (degenerative joint disease) of knee 01/04/2015  . Hemorrhoid 11/14/2014  . Gout of big toe 07/19/2014  . Essential hypertension 08/14/2013  . Gastroesophageal reflux disease without esophagitis 08/14/2013  . COPD (chronic obstructive pulmonary disease) ([place]) 04/17/2011    Scot Jun, PTA 07/06/2018, 8:31 AM  [place], [place], [address] Phone: [phone]   Fax:  [phone]  Name: Stacy [name] [facility] MRN: [id number] Date of Birth: 1960-08-28

## 2018-07-07 ENCOUNTER — Ambulatory Visit: Payer: Medicaid Other | Admitting: Physical Medicine & Rehabilitation

## 2018-07-07 DIAGNOSIS — J301 Allergic rhinitis due to pollen: Secondary | ICD-10-CM | POA: Diagnosis not present

## 2018-07-07 DIAGNOSIS — J3081 Allergic rhinitis due to animal (cat) (dog) hair and dander: Secondary | ICD-10-CM | POA: Diagnosis not present

## 2018-07-07 DIAGNOSIS — J3089 Other allergic rhinitis: Secondary | ICD-10-CM | POA: Diagnosis not present

## 2018-07-08 NOTE — Telephone Encounter (Signed)
This patient is someone that Dr.Nitka wanted to do back surgery on asap that has Medicaid so we had to get the auth before Sherrie could schedule. I just checked [place] [employer] and it is showing approved. I didn't know if Dr. [name] wanted to book the case himself since its approved or wait on Sherrie. ---please advise

## 2018-07-11 DIAGNOSIS — J301 Allergic rhinitis due to pollen: Secondary | ICD-10-CM | POA: Diagnosis not present

## 2018-07-11 DIAGNOSIS — J3089 Other allergic rhinitis: Secondary | ICD-10-CM | POA: Diagnosis not present

## 2018-07-11 DIAGNOSIS — J3081 Allergic rhinitis due to animal (cat) (dog) hair and dander: Secondary | ICD-10-CM | POA: Diagnosis not present

## 2018-07-11 NOTE — Telephone Encounter (Signed)
I called patient and scheduled for 07/15/18.

## 2018-07-11 NOTE — Telephone Encounter (Signed)
OK 

## 2018-07-12 ENCOUNTER — Other Ambulatory Visit: Payer: Self-pay

## 2018-07-12 ENCOUNTER — Other Ambulatory Visit (HOSPITAL_COMMUNITY)
Admission: RE | Admit: 2018-07-12 | Discharge: 2018-07-12 | Disposition: A | Discharge status: Home / Self Care | Payer: Medicaid Other | Source: Ambulatory Visit | Attending: Specialist | Admitting: Specialist

## 2018-07-12 DIAGNOSIS — Z1159 Encounter for screening for other viral diseases: Secondary | ICD-10-CM | POA: Diagnosis not present

## 2018-07-12 DIAGNOSIS — Z01812 Encounter for preprocedural laboratory examination: Secondary | ICD-10-CM | POA: Diagnosis not present

## 2018-07-13 ENCOUNTER — Ambulatory Visit: Payer: Medicaid Other | Admitting: Physical Therapy

## 2018-07-13 ENCOUNTER — Encounter: Payer: Self-pay | Admitting: Physical Therapy

## 2018-07-13 DIAGNOSIS — M5441 Lumbago with sciatica, right side: Secondary | ICD-10-CM | POA: Diagnosis not present

## 2018-07-13 DIAGNOSIS — M62830 Muscle spasm of back: Secondary | ICD-10-CM

## 2018-07-13 DIAGNOSIS — M5442 Lumbago with sciatica, left side: Secondary | ICD-10-CM | POA: Diagnosis not present

## 2018-07-13 DIAGNOSIS — R262 Difficulty in walking, not elsewhere classified: Secondary | ICD-10-CM

## 2018-07-13 DIAGNOSIS — F411 Generalized anxiety disorder: Secondary | ICD-10-CM | POA: Diagnosis not present

## 2018-07-13 DIAGNOSIS — M25562 Pain in left knee: Secondary | ICD-10-CM | POA: Diagnosis not present

## 2018-07-13 DIAGNOSIS — M25561 Pain in right knee: Secondary | ICD-10-CM | POA: Diagnosis not present

## 2018-07-13 LAB — NOVEL CORONAVIRUS, NAA (HOSP ORDER, SEND-OUT TO REF LAB; TAT 18-24 HRS): SARS-CoV-2, NAA: NOT DETECTED

## 2018-07-13 NOTE — Therapy (Signed)
[place], [place], [address] Phone: [phone]   Fax:  [phone]  Physical Therapy Treatment  Patient Details  Name: Stacy [name] [facility] MRN: [id number] Date of Birth: 1960/02/06 Referring Provider (PT): [name]   Encounter Date: 07/13/2018  PT End of Session - 07/13/18 0841    Visit Number  11    Date for PT Re-Evaluation  07/25/18    Authorization Type  Medicaid    PT Start Time  4496    PT Stop Time  7591    PT Time Calculation (min)  64 min       Past Medical History:  Diagnosis Date  . Arthritis   . Arthrofibrosis of total knee replacement ([place])    right  . Asthma   . COPD (chronic obstructive pulmonary disease) ([place])   . Diabetes mellitus    Type II  . GERD (gastroesophageal reflux disease)    Pt on Protonix daily  . Glaucoma   . Gout   . Headache(784.0)    otc meds prn  . Hyperlipidemia   . Hypertension    Pt on lisinopril  . Irritable bowel syndrome 11/19/2010  . Neuropathy   . Pneumonia   . Restless legs   . Shortness of breath    occasional - uses breathing tx at home    Past Surgical History:  Procedure Laterality Date  . CHOLECYSTECTOMY    . COLONOSCOPY    . ENDOMETRIAL ABLATION  10/2010  . HERNIA REPAIR     umbicial hernia  . KNEE ARTHROSCOPY Left    06/07/2017 Dr. Marlou Sa of [facility]  . KNEE CLOSED REDUCTION Right 12/06/2015   Procedure: CLOSED MANIPULATION RIGHT KNEE;  Surgeon: [name], MD;  Location: [place];  Service: Orthopedics;  Laterality: Right;  . KNEE CLOSED REDUCTION Right 01/17/2016   Procedure: CLOSED MANIPULATION RIGHT KNEE;  Surgeon: [name], MD;  Location: [place];  Service: Orthopedics;  Laterality: Right;  . KNEE JOINT MANIPULATION Right 12/06/2015  . LUMBAR DISC SURGERY  06/03/2015   L 2  L3 L4 L5   . LUMBAR LAMINECTOMY/DECOMPRESSION MICRODISCECTOMY N/A 06/03/2015   Procedure: Bilateral lateral recess decompression L2-3,  L3-4, L4-5;  Surgeon: [name], MD;  Location: [place];  Service: Orthopedics;  Laterality: N/A;  . LUMBAR LAMINECTOMY/DECOMPRESSION MICRODISCECTOMY N/A 10/02/2016   Procedure: Right L5-S1 Lateral Recess Decompression  microdiscectomy;  Surgeon: [name], MD;  Location: [place];  Service: Orthopedics;  Laterality: N/A;  . svd      x 2  . TOTAL KNEE ARTHROPLASTY Right 09/06/2015   Procedure: RIGHT TOTAL KNEE ARTHROPLASTY;  Surgeon: [name], MD;  Location: [place];  Service: Orthopedics;  Laterality: Right;  . TUBAL LIGATION    . UPPER GASTROINTESTINAL ENDOSCOPY  04/28/11    There were no vitals filed for this visit.  Subjective Assessment - 07/13/18 0749    Subjective  Patient reports that she did her preop testing yesterday and that she will be having a L3-4 microdiscectomy on Friday.    Currently in Pain?  Yes    Pain Score  8     Pain Location  Back    Pain Orientation  Lower    Aggravating Factors   activity, reports her legs have been giving out more recently                       [facility]  Adult PT Treatment/Exercise - 07/13/18 0001      Lumbar Exercises: Aerobic   UBE (Upper Arm Bike)  L4 x4 min     Nustep  level 4 x 8 minutes      Lumbar Exercises: Machines for Strengthening   Cybex Knee Extension  5lb 2x15     Cybex Knee Flexion  25lb 2x15     Leg Press  30lb 3x10     Other Lumbar Machine Exercise  Rows 25lb 3x10 & Lats 25lb 3x10      Lumbar Exercises: Supine   Bridge  20 reps;3 seconds    Other Supine Lumbar Exercises  LE on ball bridges, K2C, Oblq. and isometric abdominals      Modalities   Modalities  Moist Heat;Electrical Stimulation      Moist Heat Therapy   Number Minutes Moist Heat  15 Minutes    Moist Heat Location  Lumbar Spine      Electrical Stimulation   Electrical Stimulation Location  low back    Electrical Stimulation Action  IFC    Electrical Stimulation Parameters  supine    Electrical Stimulation Goals  Pain                PT Short Term Goals - 05/11/18 0830      PT SHORT TERM GOAL #1   Title  I with initial HEP    Status  On-going        PT Long Term Goals - 07/13/18 0841      PT LONG TERM GOAL #1   Title  I with advanced HEP    Status  Achieved      PT LONG TERM GOAL #2   Title  increase lumbar ROM 25%    Status  Partially Met      PT LONG TERM GOAL #3   Title  decreased pain 50% with ADLs    Status  Partially Met      PT LONG TERM GOAL #4   Title  report able to go up and down stairs without spasms    Status  On-going            Plan - 07/13/18 0842    Clinical Impression Statement  Back surgery scheduled for Monday. Increase strength displays on leg press, rows, and lats. She still ambulated with a limp. L quad spasms  reported with supine exercises.     Personal Factors and Comorbidities  Fitness;Past/Current Experience;Comorbidity 3+;Social Background;Education;Transportation    Comorbidities  Gout, 2 past back surgeries, TKR's, COPD    Examination-Activity Limitations  Bend;Stairs;Squat;Stand;Lift;Transfers;Locomotion Level    Stability/Clinical Decision Making  Evolving/Moderate complexity    PT Frequency  1x / week    PT Duration  12 weeks    PT Treatment/Interventions  ADLs/Self Care Home Management;Cryotherapy;Electrical Stimulation;Moist Heat;Traction;Ultrasound;Functional mobility training;Patient/family education;Therapeutic exercise;Therapeutic activities;Manual techniques;Dry needling    PT Next Visit Plan  assess pt if she returns after surgry        Patient will benefit from skilled therapeutic intervention in order to improve the following deficits and impairments:  Abnormal gait, Decreased range of motion, Difficulty walking, Increased muscle spasms, Decreased endurance, Cardiopulmonary status limiting activity, Decreased activity tolerance, Pain, Improper body mechanics, Impaired flexibility, Decreased strength, Postural dysfunction  Visit  Diagnosis: Acute bilateral low back pain with bilateral sciatica  Muscle spasm of back  Difficulty in walking, not elsewhere classified     Problem List Patient Active Problem List   Diagnosis Date Noted  .  Moderate persistent asthma without complication 15/99/6895  . Environmental and seasonal allergies 06/29/2017  . Controlled type 2 diabetes mellitus with diabetic polyneuropathy, without long-term current use of insulin ([place]) 06/29/2017  . Perennial allergic rhinitis 04/08/2017  . Sensorineural hearing loss (SNHL), bilateral 04/08/2017  . Chronic pansinusitis 03/25/2017  . Eustachian tube dysfunction, bilateral 03/25/2017  . Lichen planopilaris 70/22/0266  . Herniation of lumbar intervertebral disc with radiculopathy 10/02/2016    Class: Chronic  . Alopecia areata 08/19/2016  . Chondromalacia of both patellae 06/03/2015    Class: Chronic  . Spinal stenosis, lumbar region, with neurogenic claudication 06/03/2015  . Tobacco use disorder 04/25/2015  . DJD (degenerative joint disease) of knee 01/04/2015  . Hemorrhoid 11/14/2014  . Gout of big toe 07/19/2014  . Essential hypertension 08/14/2013  . Gastroesophageal reflux disease without esophagitis 08/14/2013  . COPD (chronic obstructive pulmonary disease) ([place]) 04/17/2011    Scot Jun, PTA 07/13/2018, 8:43 AM  [place], [place], [address] Phone: [phone]   Fax:  [phone]  Name: Stacy [name] [facility] MRN: [id number] Date of Birth: Jun 17, 1960

## 2018-07-14 ENCOUNTER — Other Ambulatory Visit: Payer: Self-pay

## 2018-07-14 ENCOUNTER — Encounter (HOSPITAL_COMMUNITY): Payer: Self-pay | Admitting: *Deleted

## 2018-07-14 ENCOUNTER — Other Ambulatory Visit (INDEPENDENT_AMBULATORY_CARE_PROVIDER_SITE_OTHER): Payer: Self-pay | Admitting: Specialist

## 2018-07-14 DIAGNOSIS — M109 Gout, unspecified: Secondary | ICD-10-CM

## 2018-07-14 NOTE — Telephone Encounter (Signed)
Allopurinol refill request  

## 2018-07-14 NOTE — Progress Notes (Signed)
Ms Stacy [name] denies chest pain or shortness of breath, that is new, patient has shortness of breath with much activity, patient sees Dr Asencion Noble, he wants patient to have  a Pulmonary function for this month. Ms Stacy [name] reports that she uses albuterol nebulizer 4 times a day. Ms Stacy [name] states that she feels like she is doing as well with breathing as she has in the past. PCP is Dr. Karle Plumber. Ms Stacy [name] is on Plavix, she reports that she was not instructed to stop it. I left a message on Sherri's voice mail and informed her that Ms Stacy [name]  has taken Plavix everyday including today. Ms Stacy [name] was seen by [facility] and is on Plavix for PAD.  Ms Stacy [name] has type II diabetes' she reports that CBGs run 113- 120. I instructed [pat to not take Glypizide in am.  ( patient is not taking any pills in am as they are all packaged together.)I instructed patient to check CBG after awaking and every 2 hours until arrival  to the hospital.  I Instructed patient if CBG is less than 70 to take 4 Glucose Tablets. Recheck CBG in 15 minutes then call pre- op desk at [phone] for further instructions.  I instructed patient hat she can drink clear liquids after midnight until 4:30, we discussed what clear liquids are and she reports that she will drink apple juice, if she wakes up.  I also told patient that we would like her to drink G2, I told her she could come by and pick up a bottle or purchase 12 ounces and drink it between 4:00 and 4:30 am; patient said that she doesn't like Gatorade and she will not be drinking it."     Patient denies that she or her family has experienced any of the following: Cough Fever >100.4 Runny Nose Sore Throat Difficulty breathing/ shortness of breath Travel in past 14 days- no Patient tested negative for COVID 07/12/2018 and has been quarantined since. I asked anesthesia PA-C to review.

## 2018-07-14 NOTE — Anesthesia Preprocedure Evaluation (Addendum)
Anesthesia Evaluation  Patient identified by MRN, date of birth, ID band Patient awake    Reviewed: Allergy & Precautions, NPO status , Patient's Chart, lab work & pertinent test results  History of Anesthesia Complications Negative for: history of anesthetic complications  Airway Mallampati: III  TM Distance: >3 FB Neck ROM: Full    Dental   Pulmonary asthma , COPD, Current Smoker,    Pulmonary exam normal        Cardiovascular Exercise Tolerance: Poor hypertension, Normal cardiovascular exam     Neuro/Psych negative neurological ROS  negative psych ROS   GI/Hepatic Neg liver ROS, GERD  ,  Endo/Other  diabetes, Type 2  Renal/GU negative Renal ROS  negative genitourinary   Musculoskeletal  (+) Arthritis ,   Abdominal   Peds  Hematology negative hematology ROS (+)   Anesthesia Other Findings On Plavix instead of aspirin for primary prevention d/t aspirin intolerance. No known h/o MI/CVA/PVD/blood clot  Reproductive/Obstetrics                            Anesthesia Physical Anesthesia Plan  ASA: III  Anesthesia Plan: General   Post-op Pain Management:    Induction: Intravenous  PONV Risk Score and Plan: 2 and Ondansetron, Dexamethasone, Midazolam and Treatment may vary due to age or medical condition  Airway Management Planned: Oral ETT  Additional Equipment: None  Intra-op Plan:   Post-operative Plan: Extubation in OR  Informed Consent: I have reviewed the patients History and Physical, chart, labs and discussed the procedure including the risks, benefits and alternatives for the proposed anesthesia with the patient or authorized representative who has indicated his/her understanding and acceptance.     Dental advisory given  Plan Discussed with:   Anesthesia Plan Comments: (Dr. [name] aware pt took Plavix 07/14/18. He advised okay to proceed.)       Anesthesia Quick  Evaluation

## 2018-07-15 ENCOUNTER — Ambulatory Visit (HOSPITAL_COMMUNITY): Payer: Medicaid Other | Admitting: Physician Assistant

## 2018-07-15 ENCOUNTER — Ambulatory Visit (HOSPITAL_COMMUNITY): Payer: Medicaid Other

## 2018-07-15 ENCOUNTER — Encounter (HOSPITAL_COMMUNITY)
Admission: RE | Disposition: A | Discharge status: Home / Self Care | Payer: Self-pay | Source: Home / Self Care | Attending: Specialist

## 2018-07-15 ENCOUNTER — Observation Stay (HOSPITAL_COMMUNITY)
Admission: RE | Admit: 2018-07-15 | Discharge: 2018-07-16 | Disposition: A | Discharge status: Home / Self Care | Payer: Medicaid Other | Attending: Specialist | Admitting: Specialist

## 2018-07-15 ENCOUNTER — Encounter (HOSPITAL_COMMUNITY): Payer: Self-pay | Admitting: *Deleted

## 2018-07-15 DIAGNOSIS — K589 Irritable bowel syndrome without diarrhea: Secondary | ICD-10-CM | POA: Diagnosis not present

## 2018-07-15 DIAGNOSIS — J449 Chronic obstructive pulmonary disease, unspecified: Secondary | ICD-10-CM | POA: Insufficient documentation

## 2018-07-15 DIAGNOSIS — E1140 Type 2 diabetes mellitus with diabetic neuropathy, unspecified: Secondary | ICD-10-CM | POA: Diagnosis not present

## 2018-07-15 DIAGNOSIS — Z791 Long term (current) use of non-steroidal anti-inflammatories (NSAID): Secondary | ICD-10-CM | POA: Insufficient documentation

## 2018-07-15 DIAGNOSIS — F17210 Nicotine dependence, cigarettes, uncomplicated: Secondary | ICD-10-CM | POA: Diagnosis not present

## 2018-07-15 DIAGNOSIS — M5116 Intervertebral disc disorders with radiculopathy, lumbar region: Principal | ICD-10-CM | POA: Insufficient documentation

## 2018-07-15 DIAGNOSIS — Z9049 Acquired absence of other specified parts of digestive tract: Secondary | ICD-10-CM | POA: Insufficient documentation

## 2018-07-15 DIAGNOSIS — H409 Unspecified glaucoma: Secondary | ICD-10-CM | POA: Insufficient documentation

## 2018-07-15 DIAGNOSIS — Z91013 Allergy to seafood: Secondary | ICD-10-CM | POA: Diagnosis not present

## 2018-07-15 DIAGNOSIS — G2581 Restless legs syndrome: Secondary | ICD-10-CM | POA: Diagnosis not present

## 2018-07-15 DIAGNOSIS — M109 Gout, unspecified: Secondary | ICD-10-CM | POA: Insufficient documentation

## 2018-07-15 DIAGNOSIS — Z888 Allergy status to other drugs, medicaments and biological substances status: Secondary | ICD-10-CM | POA: Diagnosis not present

## 2018-07-15 DIAGNOSIS — Z01810 Encounter for preprocedural cardiovascular examination: Secondary | ICD-10-CM | POA: Diagnosis not present

## 2018-07-15 DIAGNOSIS — M1990 Unspecified osteoarthritis, unspecified site: Secondary | ICD-10-CM | POA: Diagnosis not present

## 2018-07-15 DIAGNOSIS — Z9889 Other specified postprocedural states: Secondary | ICD-10-CM

## 2018-07-15 DIAGNOSIS — Z79899 Other long term (current) drug therapy: Secondary | ICD-10-CM | POA: Insufficient documentation

## 2018-07-15 DIAGNOSIS — Z886 Allergy status to analgesic agent status: Secondary | ICD-10-CM | POA: Diagnosis not present

## 2018-07-15 DIAGNOSIS — Z96651 Presence of right artificial knee joint: Secondary | ICD-10-CM | POA: Insufficient documentation

## 2018-07-15 DIAGNOSIS — E785 Hyperlipidemia, unspecified: Secondary | ICD-10-CM | POA: Insufficient documentation

## 2018-07-15 DIAGNOSIS — K219 Gastro-esophageal reflux disease without esophagitis: Secondary | ICD-10-CM | POA: Diagnosis not present

## 2018-07-15 DIAGNOSIS — Z7951 Long term (current) use of inhaled steroids: Secondary | ICD-10-CM | POA: Insufficient documentation

## 2018-07-15 DIAGNOSIS — I10 Essential (primary) hypertension: Secondary | ICD-10-CM | POA: Diagnosis not present

## 2018-07-15 DIAGNOSIS — Z419 Encounter for procedure for purposes other than remedying health state, unspecified: Secondary | ICD-10-CM

## 2018-07-15 DIAGNOSIS — Z7984 Long term (current) use of oral hypoglycemic drugs: Secondary | ICD-10-CM | POA: Insufficient documentation

## 2018-07-15 DIAGNOSIS — Z01818 Encounter for other preprocedural examination: Secondary | ICD-10-CM

## 2018-07-15 DIAGNOSIS — R9431 Abnormal electrocardiogram [ECG] [EKG]: Secondary | ICD-10-CM | POA: Diagnosis not present

## 2018-07-15 DIAGNOSIS — Z981 Arthrodesis status: Secondary | ICD-10-CM | POA: Diagnosis not present

## 2018-07-15 DIAGNOSIS — J45909 Unspecified asthma, uncomplicated: Secondary | ICD-10-CM | POA: Diagnosis not present

## 2018-07-15 DIAGNOSIS — Z7902 Long term (current) use of antithrombotics/antiplatelets: Secondary | ICD-10-CM | POA: Diagnosis not present

## 2018-07-15 HISTORY — PX: LUMBAR LAMINECTOMY/DECOMPRESSION MICRODISCECTOMY: SHX5026

## 2018-07-15 LAB — CBC
HCT: 35.7 % — ABNORMAL LOW (ref 36.0–46.0)
Hemoglobin: 11.9 g/dL — ABNORMAL LOW (ref 12.0–15.0)
MCH: 26.4 pg (ref 26.0–34.0)
MCHC: 33.3 g/dL (ref 30.0–36.0)
MCV: 79.2 fL — ABNORMAL LOW (ref 80.0–100.0)
Platelets: 293 10*3/uL (ref 150–400)
RBC: 4.51 MIL/uL (ref 3.87–5.11)
RDW: 15.3 % (ref 11.5–15.5)
WBC: 6.8 10*3/uL (ref 4.0–10.5)
nRBC: 0 % (ref 0.0–0.2)

## 2018-07-15 LAB — APTT: aPTT: 36 seconds (ref 24–36)

## 2018-07-15 LAB — COMPREHENSIVE METABOLIC PANEL
ALT: 17 U/L (ref 0–44)
AST: 22 U/L (ref 15–41)
Albumin: 4.1 g/dL (ref 3.5–5.0)
Alkaline Phosphatase: 107 U/L (ref 38–126)
Anion gap: 14 (ref 5–15)
BUN: 11 mg/dL (ref 6–20)
CO2: 23 mmol/L (ref 22–32)
Calcium: 9.5 mg/dL (ref 8.9–10.3)
Chloride: 103 mmol/L (ref 98–111)
Creatinine, Ser: 0.81 mg/dL (ref 0.44–1.00)
GFR calc Af Amer: >60 mL/min (ref >60)
GFR calc non Af Amer: >60 mL/min (ref >60)
Glucose, Bld: 150 mg/dL — ABNORMAL HIGH (ref 70–99)
Potassium: 3.2 mmol/L — ABNORMAL LOW (ref 3.5–5.1)
Sodium: 140 mmol/L (ref 135–145)
Total Bilirubin: 0.4 mg/dL (ref 0.3–1.2)
Total Protein: 7.7 g/dL (ref 6.5–8.1)

## 2018-07-15 LAB — GLUCOSE, CAPILLARY
Glucose-Capillary: 152 mg/dL — ABNORMAL HIGH (ref 70–99)
Glucose-Capillary: 108 mg/dL — ABNORMAL HIGH (ref 70–99)
Glucose-Capillary: 152 mg/dL — ABNORMAL HIGH (ref 70–99)
Glucose-Capillary: 127 mg/dL — ABNORMAL HIGH (ref 70–99)
Glucose-Capillary: 134 mg/dL — ABNORMAL HIGH (ref 70–99)

## 2018-07-15 LAB — SURGICAL PCR SCREEN
MRSA, PCR: NEGATIVE
Staphylococcus aureus: NEGATIVE

## 2018-07-15 LAB — PROTIME-INR
INR: 1 (ref 0.8–1.2)
Prothrombin Time: 13.3 seconds (ref 11.4–15.2)

## 2018-07-15 LAB — HEMOGLOBIN A1C
Hgb A1c MFr Bld: 7.3 % — ABNORMAL HIGH (ref 4.8–5.6)
Mean Plasma Glucose: 162.81 mg/dL

## 2018-07-15 SURGERY — LUMBAR LAMINECTOMY/DECOMPRESSION MICRODISCECTOMY
Anesthesia: General

## 2018-07-15 MED ORDER — POTASSIUM CHLORIDE CRYS ER 20 MEQ PO TBCR
20.0000 meq | EXTENDED_RELEASE_TABLET | Freq: Every day | ORAL | Status: DC
  Administered 2018-07-15 – 2018-07-16 (×2): 20 meq via ORAL
  Filled 2018-07-15 (×2): qty 1

## 2018-07-15 MED ORDER — HYDROCHLOROTHIAZIDE 25 MG PO TABS
25.0000 mg | ORAL_TABLET | Freq: Every day | ORAL | Status: DC
  Administered 2018-07-15 – 2018-07-16 (×2): 25 mg via ORAL
  Filled 2018-07-15 (×2): qty 1

## 2018-07-15 MED ORDER — LORATADINE 10 MG PO TABS
10.0000 mg | ORAL_TABLET | Freq: Every day | ORAL | Status: DC
  Administered 2018-07-16: 10 mg via ORAL
  Filled 2018-07-15: qty 1

## 2018-07-15 MED ORDER — SUCCINYLCHOLINE CHLORIDE 20 MG/ML IJ SOLN
INTRAMUSCULAR | Status: DC | PRN
  Administered 2018-07-15: 80 mg via INTRAVENOUS

## 2018-07-15 MED ORDER — OXYCODONE HCL 5 MG PO TABS: 5.0000 mg | ORAL_TABLET | Freq: Once | ORAL | Status: DC | PRN

## 2018-07-15 MED ORDER — IRBESARTAN 150 MG PO TABS
150.0000 mg | ORAL_TABLET | Freq: Every day | ORAL | Status: DC
  Administered 2018-07-15 – 2018-07-16 (×2): 150 mg via ORAL
  Filled 2018-07-15 (×2): qty 1

## 2018-07-15 MED ORDER — ALLOPURINOL 100 MG PO TABS
100.0000 mg | ORAL_TABLET | Freq: Every day | ORAL | Status: DC
  Administered 2018-07-15 – 2018-07-16 (×2): 100 mg via ORAL
  Filled 2018-07-15 (×2): qty 1

## 2018-07-15 MED ORDER — FLEET ENEMA 7-19 GM/118ML RE ENEM: 1.0000 | ENEMA | Freq: Once | RECTAL | Status: DC | PRN

## 2018-07-15 MED ORDER — ROCURONIUM BROMIDE 100 MG/10ML IV SOLN
INTRAVENOUS | Status: DC | PRN
  Administered 2018-07-15: 60 mg via INTRAVENOUS

## 2018-07-15 MED ORDER — FLUTICASONE PROPIONATE 50 MCG/ACT NA SUSP
1.0000 | Freq: Every day | NASAL | Status: DC
  Filled 2018-07-15: qty 16

## 2018-07-15 MED ORDER — HYDROCODONE-ACETAMINOPHEN 10-325 MG PO TABS: 2.0000 | ORAL_TABLET | ORAL | Status: DC | PRN

## 2018-07-15 MED ORDER — SUGAMMADEX SODIUM 200 MG/2ML IV SOLN
INTRAVENOUS | Status: DC | PRN
  Administered 2018-07-15: 200 mg via INTRAVENOUS

## 2018-07-15 MED ORDER — ACETAMINOPHEN 325 MG PO TABS: 650.0000 mg | ORAL_TABLET | ORAL | Status: DC | PRN

## 2018-07-15 MED ORDER — CEFAZOLIN SODIUM-DEXTROSE 2-4 GM/100ML-% IV SOLN
INTRAVENOUS | Status: AC
  Filled 2018-07-15: qty 100

## 2018-07-15 MED ORDER — VALSARTAN-HYDROCHLOROTHIAZIDE 160-12.5 MG PO TABS: 1.0000 | ORAL_TABLET | Freq: Every day | ORAL | Status: DC

## 2018-07-15 MED ORDER — SIMVASTATIN 20 MG PO TABS
20.0000 mg | ORAL_TABLET | Freq: Every day | ORAL | Status: DC
  Administered 2018-07-15: 20 mg via ORAL
  Filled 2018-07-15 (×2): qty 1

## 2018-07-15 MED ORDER — GABAPENTIN 300 MG PO CAPS: 300.0000 mg | ORAL_CAPSULE | Freq: Three times a day (TID) | ORAL | Status: DC

## 2018-07-15 MED ORDER — DOCUSATE SODIUM 100 MG PO CAPS
100.0000 mg | ORAL_CAPSULE | Freq: Two times a day (BID) | ORAL | Status: DC
  Administered 2018-07-15 – 2018-07-16 (×2): 100 mg via ORAL
  Filled 2018-07-15 (×2): qty 1

## 2018-07-15 MED ORDER — CHLORHEXIDINE GLUCONATE 4 % EX LIQD: 60.0000 mL | Freq: Once | CUTANEOUS | Status: DC

## 2018-07-15 MED ORDER — ACETAMINOPHEN 650 MG RE SUPP: 650.0000 mg | RECTAL | Status: DC | PRN

## 2018-07-15 MED ORDER — HYDROXYZINE HCL 10 MG PO TABS: 10.0000 mg | ORAL_TABLET | ORAL | Status: DC

## 2018-07-15 MED ORDER — MENTHOL 3 MG MT LOZG: 1.0000 | LOZENGE | OROMUCOSAL | Status: DC | PRN

## 2018-07-15 MED ORDER — MUPIROCIN 2 % EX OINT
TOPICAL_OINTMENT | Freq: Once | CUTANEOUS | Status: DC
  Filled 2018-07-15: qty 22

## 2018-07-15 MED ORDER — DEXAMETHASONE SODIUM PHOSPHATE 10 MG/ML IJ SOLN
INTRAMUSCULAR | Status: DC | PRN
  Administered 2018-07-15: 5 mg via INTRAVENOUS

## 2018-07-15 MED ORDER — SODIUM CHLORIDE 0.9% FLUSH: 3.0000 mL | INTRAVENOUS | Status: DC | PRN

## 2018-07-15 MED ORDER — CLOPIDOGREL BISULFATE 75 MG PO TABS
75.0000 mg | ORAL_TABLET | Freq: Every day | ORAL | Status: DC
  Administered 2018-07-16: 75 mg via ORAL
  Filled 2018-07-15 (×2): qty 1

## 2018-07-15 MED ORDER — GLIMEPIRIDE 2 MG PO TABS
2.0000 mg | ORAL_TABLET | Freq: Every day | ORAL | Status: DC
  Administered 2018-07-16: 2 mg via ORAL
  Filled 2018-07-15 (×2): qty 1

## 2018-07-15 MED ORDER — OXYCODONE HCL 5 MG/5ML PO SOLN: 5.0000 mg | Freq: Once | ORAL | Status: DC | PRN

## 2018-07-15 MED ORDER — NICOTINE 14 MG/24HR TD PT24
14.0000 mg | MEDICATED_PATCH | Freq: Every day | TRANSDERMAL | Status: DC
  Administered 2018-07-15 – 2018-07-16 (×2): 14 mg via TRANSDERMAL
  Filled 2018-07-15 (×2): qty 1

## 2018-07-15 MED ORDER — MUPIROCIN 2 % EX OINT
TOPICAL_OINTMENT | CUTANEOUS | Status: AC
  Administered 2018-07-15: 1
  Filled 2018-07-15: qty 22

## 2018-07-15 MED ORDER — SUCRALFATE 1 G PO TABS
1.0000 g | ORAL_TABLET | Freq: Three times a day (TID) | ORAL | Status: DC
  Administered 2018-07-15 – 2018-07-16 (×4): 1 g via ORAL
  Filled 2018-07-15 (×4): qty 1

## 2018-07-15 MED ORDER — BUPIVACAINE LIPOSOME 1.3 % IJ SUSP
INTRAMUSCULAR | Status: DC | PRN
  Administered 2018-07-15: 20 mL

## 2018-07-15 MED ORDER — INSULIN ASPART 100 UNIT/ML ~~LOC~~ SOLN
0.0000 [IU] | Freq: Three times a day (TID) | SUBCUTANEOUS | Status: DC
  Administered 2018-07-15: 2 [IU] via SUBCUTANEOUS
  Administered 2018-07-16: 5 [IU] via SUBCUTANEOUS
  Administered 2018-07-16: 3 [IU] via SUBCUTANEOUS

## 2018-07-15 MED ORDER — MIDAZOLAM HCL 2 MG/2ML IJ SOLN
INTRAMUSCULAR | Status: AC
  Filled 2018-07-15: qty 2

## 2018-07-15 MED ORDER — PROPOFOL 10 MG/ML IV BOLUS
INTRAVENOUS | Status: AC
  Filled 2018-07-15: qty 20

## 2018-07-15 MED ORDER — LIDOCAINE HCL (CARDIAC) PF 100 MG/5ML IV SOSY
PREFILLED_SYRINGE | INTRAVENOUS | Status: DC | PRN
  Administered 2018-07-15: 60 mg via INTRAVENOUS

## 2018-07-15 MED ORDER — MORPHINE SULFATE (PF) 2 MG/ML IV SOLN: 1.0000 mg | INTRAVENOUS | Status: DC | PRN

## 2018-07-15 MED ORDER — HYDROXYZINE HCL 10 MG PO TABS
20.0000 mg | ORAL_TABLET | Freq: Every evening | ORAL | Status: DC | PRN
  Filled 2018-07-15: qty 2

## 2018-07-15 MED ORDER — BISACODYL 5 MG PO TBEC: 5.0000 mg | DELAYED_RELEASE_TABLET | Freq: Every day | ORAL | Status: DC | PRN

## 2018-07-15 MED ORDER — BUPIVACAINE HCL (PF) 0.5 % IJ SOLN
INTRAMUSCULAR | Status: AC
  Filled 2018-07-15: qty 30

## 2018-07-15 MED ORDER — HYDROXYZINE HCL 10 MG PO TABS
10.0000 mg | ORAL_TABLET | Freq: Two times a day (BID) | ORAL | Status: DC | PRN
  Filled 2018-07-15: qty 1

## 2018-07-15 MED ORDER — ALBUTEROL SULFATE (2.5 MG/3ML) 0.083% IN NEBU: 2.5000 mg | INHALATION_SOLUTION | Freq: Four times a day (QID) | RESPIRATORY_TRACT | Status: DC | PRN

## 2018-07-15 MED ORDER — MONTELUKAST SODIUM 10 MG PO TABS
10.0000 mg | ORAL_TABLET | Freq: Every day | ORAL | Status: DC
  Administered 2018-07-15: 10 mg via ORAL
  Filled 2018-07-15: qty 1

## 2018-07-15 MED ORDER — POTASSIUM CHLORIDE ER 20 MEQ PO TBCR: 20.0000 meq | EXTENDED_RELEASE_TABLET | Freq: Every day | ORAL | Status: DC

## 2018-07-15 MED ORDER — MELATONIN 3 MG PO TABS
3.0000 mg | ORAL_TABLET | Freq: Every evening | ORAL | Status: DC | PRN
  Filled 2018-07-15: qty 1

## 2018-07-15 MED ORDER — ONDANSETRON HCL 4 MG PO TABS: 4.0000 mg | ORAL_TABLET | Freq: Four times a day (QID) | ORAL | Status: DC | PRN

## 2018-07-15 MED ORDER — COLCHICINE 0.6 MG PO TABS
0.6000 mg | ORAL_TABLET | Freq: Every day | ORAL | Status: DC
  Administered 2018-07-15 – 2018-07-16 (×2): 0.6 mg via ORAL
  Filled 2018-07-15 (×2): qty 1

## 2018-07-15 MED ORDER — FENTANYL CITRATE (PF) 250 MCG/5ML IJ SOLN
INTRAMUSCULAR | Status: AC
  Filled 2018-07-15: qty 5

## 2018-07-15 MED ORDER — SODIUM CHLORIDE 0.9% FLUSH
3.0000 mL | Freq: Two times a day (BID) | INTRAVENOUS | Status: DC
  Administered 2018-07-15: 3 mL via INTRAVENOUS

## 2018-07-15 MED ORDER — SUCCINYLCHOLINE CHLORIDE 200 MG/10ML IV SOSY
PREFILLED_SYRINGE | INTRAVENOUS | Status: AC
  Filled 2018-07-15: qty 10

## 2018-07-15 MED ORDER — METHOCARBAMOL 500 MG PO TABS: 500.0000 mg | ORAL_TABLET | Freq: Four times a day (QID) | ORAL | Status: DC | PRN

## 2018-07-15 MED ORDER — ALUM & MAG HYDROXIDE-SIMETH 200-200-20 MG/5ML PO SUSP: 30.0000 mL | Freq: Four times a day (QID) | ORAL | Status: DC | PRN

## 2018-07-15 MED ORDER — BUPIVACAINE HCL 0.5 % IJ SOLN
INTRAMUSCULAR | Status: DC | PRN
  Administered 2018-07-15: 20 mL

## 2018-07-15 MED ORDER — POLYETHYLENE GLYCOL 3350 17 G PO PACK: 17.0000 g | PACK | Freq: Every day | ORAL | Status: DC | PRN

## 2018-07-15 MED ORDER — ONDANSETRON HCL 4 MG/2ML IJ SOLN: 4.0000 mg | Freq: Once | INTRAMUSCULAR | Status: DC | PRN

## 2018-07-15 MED ORDER — ONDANSETRON HCL 4 MG/2ML IJ SOLN
INTRAMUSCULAR | Status: DC | PRN
  Administered 2018-07-15: 4 mg via INTRAVENOUS

## 2018-07-15 MED ORDER — FENTANYL CITRATE (PF) 100 MCG/2ML IJ SOLN
25.0000 ug | INTRAMUSCULAR | Status: DC | PRN
  Administered 2018-07-15: 50 ug via INTRAVENOUS

## 2018-07-15 MED ORDER — HYDROCODONE-ACETAMINOPHEN 7.5-325 MG PO TABS
1.0000 | ORAL_TABLET | ORAL | Status: DC | PRN
  Administered 2018-07-16: 1 via ORAL

## 2018-07-15 MED ORDER — PROPOFOL 10 MG/ML IV BOLUS
INTRAVENOUS | Status: DC | PRN
  Administered 2018-07-15: 160 mg via INTRAVENOUS

## 2018-07-15 MED ORDER — HYDROCHLOROTHIAZIDE 12.5 MG PO CAPS
12.5000 mg | ORAL_CAPSULE | Freq: Every day | ORAL | Status: DC
  Administered 2018-07-15 – 2018-07-16 (×2): 12.5 mg via ORAL
  Filled 2018-07-15 (×2): qty 1

## 2018-07-15 MED ORDER — CEFAZOLIN SODIUM-DEXTROSE 2-4 GM/100ML-% IV SOLN
2.0000 g | INTRAVENOUS | Status: AC
  Administered 2018-07-15: 2 g via INTRAVENOUS

## 2018-07-15 MED ORDER — FLUTICASONE FUROATE-VILANTEROL 100-25 MCG/INH IN AEPB
1.0000 | INHALATION_SPRAY | Freq: Every day | RESPIRATORY_TRACT | Status: DC
  Administered 2018-07-16: 1 via RESPIRATORY_TRACT
  Filled 2018-07-15: qty 28

## 2018-07-15 MED ORDER — LIDOCAINE 2% (20 MG/ML) 5 ML SYRINGE
INTRAMUSCULAR | Status: AC
  Filled 2018-07-15: qty 5

## 2018-07-15 MED ORDER — MIDAZOLAM HCL 5 MG/5ML IJ SOLN
INTRAMUSCULAR | Status: DC | PRN
  Administered 2018-07-15: 2 mg via INTRAVENOUS

## 2018-07-15 MED ORDER — THROMBIN 20000 UNITS EX SOLR
CUTANEOUS | Status: DC | PRN
  Administered 2018-07-15: 20 mL via TOPICAL

## 2018-07-15 MED ORDER — HEMOSTATIC AGENTS (NO CHARGE) OPTIME
TOPICAL | Status: DC | PRN
  Administered 2018-07-15: 1 via TOPICAL

## 2018-07-15 MED ORDER — ONDANSETRON HCL 4 MG/2ML IJ SOLN
INTRAMUSCULAR | Status: AC
  Filled 2018-07-15: qty 2

## 2018-07-15 MED ORDER — FENTANYL CITRATE (PF) 100 MCG/2ML IJ SOLN
INTRAMUSCULAR | Status: AC
  Filled 2018-07-15: qty 2

## 2018-07-15 MED ORDER — ONDANSETRON HCL 4 MG/2ML IJ SOLN: 4.0000 mg | Freq: Four times a day (QID) | INTRAMUSCULAR | Status: DC | PRN

## 2018-07-15 MED ORDER — GABAPENTIN 300 MG PO CAPS
300.0000 mg | ORAL_CAPSULE | Freq: Three times a day (TID) | ORAL | Status: DC
  Administered 2018-07-15 – 2018-07-16 (×3): 300 mg via ORAL
  Filled 2018-07-15 (×3): qty 1

## 2018-07-15 MED ORDER — PANTOPRAZOLE SODIUM 40 MG PO TBEC
40.0000 mg | DELAYED_RELEASE_TABLET | Freq: Every day | ORAL | Status: DC
  Administered 2018-07-15 – 2018-07-16 (×2): 40 mg via ORAL
  Filled 2018-07-15 (×2): qty 1

## 2018-07-15 MED ORDER — SODIUM CHLORIDE 0.9 % IV SOLN: 250.0000 mL | INTRAVENOUS | Status: DC

## 2018-07-15 MED ORDER — METHOCARBAMOL 1000 MG/10ML IJ SOLN
500.0000 mg | Freq: Four times a day (QID) | INTRAVENOUS | Status: DC | PRN
  Filled 2018-07-15: qty 5

## 2018-07-15 MED ORDER — CROMOLYN SODIUM 4 % OP SOLN
1.0000 [drp] | Freq: Four times a day (QID) | OPHTHALMIC | Status: DC
  Administered 2018-07-15 – 2018-07-16 (×2): 1 [drp] via OPHTHALMIC
  Filled 2018-07-15: qty 10

## 2018-07-15 MED ORDER — THROMBIN (RECOMBINANT) 20000 UNITS EX SOLR
CUTANEOUS | Status: AC
  Filled 2018-07-15: qty 20000

## 2018-07-15 MED ORDER — ZOLPIDEM TARTRATE 5 MG PO TABS: 5.0000 mg | ORAL_TABLET | Freq: Every evening | ORAL | Status: DC | PRN

## 2018-07-15 MED ORDER — 0.9 % SODIUM CHLORIDE (POUR BTL) OPTIME
TOPICAL | Status: DC | PRN
  Administered 2018-07-15: 1000 mL

## 2018-07-15 MED ORDER — COLCHICINE 0.6 MG PO CAPS: 1.0000 | ORAL_CAPSULE | Freq: Every day | ORAL | Status: DC

## 2018-07-15 MED ORDER — CELECOXIB 200 MG PO CAPS
200.0000 mg | ORAL_CAPSULE | Freq: Two times a day (BID) | ORAL | Status: DC
  Administered 2018-07-15: 200 mg via ORAL
  Filled 2018-07-15 (×4): qty 1

## 2018-07-15 MED ORDER — LACTATED RINGERS IV SOLN
INTRAVENOUS | Status: DC
  Administered 2018-07-15 (×2): via INTRAVENOUS

## 2018-07-15 MED ORDER — HYDROCODONE-ACETAMINOPHEN 7.5-325 MG PO TABS
1.0000 | ORAL_TABLET | Freq: Four times a day (QID) | ORAL | Status: DC
  Administered 2018-07-15 – 2018-07-16 (×3): 1 via ORAL
  Filled 2018-07-15 (×4): qty 1

## 2018-07-15 MED ORDER — UMECLIDINIUM BROMIDE 62.5 MCG/INH IN AEPB
1.0000 | INHALATION_SPRAY | Freq: Every day | RESPIRATORY_TRACT | Status: DC
  Administered 2018-07-16: 1 via RESPIRATORY_TRACT
  Filled 2018-07-15: qty 7

## 2018-07-15 MED ORDER — SODIUM CHLORIDE 0.9 % IV SOLN
INTRAVENOUS | Status: DC
  Administered 2018-07-15 (×2): via INTRAVENOUS

## 2018-07-15 MED ORDER — TIOTROPIUM BROMIDE MONOHYDRATE 18 MCG IN CAPS: 18.0000 ug | ORAL_CAPSULE | Freq: Every day | RESPIRATORY_TRACT | Status: DC

## 2018-07-15 MED ORDER — FENTANYL CITRATE (PF) 100 MCG/2ML IJ SOLN
INTRAMUSCULAR | Status: DC | PRN
  Administered 2018-07-15 (×3): 50 ug via INTRAVENOUS
  Administered 2018-07-15 (×2): 100 ug via INTRAVENOUS
  Administered 2018-07-15: 50 ug via INTRAVENOUS
  Administered 2018-07-15: 100 ug via INTRAVENOUS

## 2018-07-15 MED ORDER — PHENOL 1.4 % MT LIQD: 1.0000 | OROMUCOSAL | Status: DC | PRN

## 2018-07-15 MED ORDER — BUPIVACAINE LIPOSOME 1.3 % IJ SUSP
20.0000 mL | INTRAMUSCULAR | Status: AC
  Administered 2018-07-15: 20 mL
  Filled 2018-07-15: qty 20

## 2018-07-15 MED ORDER — CEFAZOLIN SODIUM-DEXTROSE 2-4 GM/100ML-% IV SOLN
2.0000 g | Freq: Three times a day (TID) | INTRAVENOUS | Status: AC
  Administered 2018-07-15 – 2018-07-16 (×2): 2 g via INTRAVENOUS
  Filled 2018-07-15 (×2): qty 100

## 2018-07-15 SURGICAL SUPPLY — 49 items
BUR SABER RD CUTTING 3.0 (BURR) ×2 IMPLANT
CANISTER SUCT 3000ML PPV (MISCELLANEOUS) ×2 IMPLANT
COVER SURGICAL LIGHT HANDLE (MISCELLANEOUS) ×2 IMPLANT
COVER WAND RF STERILE (DRAPES) ×2 IMPLANT
DERMABOND ADVANCED (GAUZE/BANDAGES/DRESSINGS) ×1
DERMABOND ADVANCED .7 DNX12 (GAUZE/BANDAGES/DRESSINGS) ×1 IMPLANT
DRAPE HALF SHEET 40X57 (DRAPES) IMPLANT
DRAPE INCISE IOBAN 66X45 STRL (DRAPES) IMPLANT
DRAPE MICROSCOPE LEICA (MISCELLANEOUS) ×2 IMPLANT
DRAPE SURG 17X23 STRL (DRAPES) ×8 IMPLANT
DRSG MEPILEX BORDER 4X4 (GAUZE/BANDAGES/DRESSINGS) IMPLANT
DRSG MEPILEX BORDER 4X8 (GAUZE/BANDAGES/DRESSINGS) ×2 IMPLANT
DURAPREP 26ML APPLICATOR (WOUND CARE) ×2 IMPLANT
ELECT REM PT RETURN 9FT ADLT (ELECTROSURGICAL) ×2
ELECTRODE REM PT RTRN 9FT ADLT (ELECTROSURGICAL) ×1 IMPLANT
EVACUATOR 1/8 PVC DRAIN (DRAIN) IMPLANT
GLOVE BIOGEL PI IND STRL 8 (GLOVE) ×1 IMPLANT
GLOVE BIOGEL PI INDICATOR 8 (GLOVE) ×1
GLOVE ECLIPSE 9.0 STRL (GLOVE) ×2 IMPLANT
GLOVE ORTHO TXT STRL SZ7.5 (GLOVE) ×2 IMPLANT
GLOVE SURG 8.5 LATEX PF (GLOVE) ×2 IMPLANT
GOWN STRL REUS W/ TWL LRG LVL3 (GOWN DISPOSABLE) ×1 IMPLANT
GOWN STRL REUS W/TWL 2XL LVL3 (GOWN DISPOSABLE) ×4 IMPLANT
GOWN STRL REUS W/TWL LRG LVL3 (GOWN DISPOSABLE) ×1
KIT BASIN OR (CUSTOM PROCEDURE TRAY) ×2 IMPLANT
KIT TURNOVER KIT B (KITS) ×2 IMPLANT
NEEDLE HYPO 25GX1X1/2 BEV (NEEDLE) ×2 IMPLANT
NEEDLE SPNL 18GX3.5 QUINCKE PK (NEEDLE) ×4 IMPLANT
NS IRRIG 1000ML POUR BTL (IV SOLUTION) ×2 IMPLANT
PACK LAMINECTOMY ORTHO (CUSTOM PROCEDURE TRAY) ×2 IMPLANT
PAD ARMBOARD 7.5X6 YLW CONV (MISCELLANEOUS) ×4 IMPLANT
PATTIES SURGICAL .5 X.5 (GAUZE/BANDAGES/DRESSINGS) IMPLANT
PATTIES SURGICAL .75X.75 (GAUZE/BANDAGES/DRESSINGS) IMPLANT
PATTIES SURGICAL 1X1 (DISPOSABLE) IMPLANT
SPONGE LAP 4X18 RFD (DISPOSABLE) IMPLANT
SPONGE SURGIFOAM ABS GEL 100 (HEMOSTASIS) IMPLANT
SUT VIC AB 0 CT1 27 (SUTURE)
SUT VIC AB 0 CT1 27XBRD ANBCTR (SUTURE) IMPLANT
SUT VIC AB 1 CT1 27 (SUTURE)
SUT VIC AB 1 CT1 27XBRD ANBCTR (SUTURE) IMPLANT
SUT VIC AB 2-0 CT1 27 (SUTURE)
SUT VIC AB 2-0 CT1 TAPERPNT 27 (SUTURE) IMPLANT
SUT VIC AB 3-0 X1 27 (SUTURE) ×2 IMPLANT
SUT VICRYL 0 UR6 27IN ABS (SUTURE) ×2 IMPLANT
TOWEL GREEN STERILE (TOWEL DISPOSABLE) ×2 IMPLANT
TOWEL GREEN STERILE FF (TOWEL DISPOSABLE) ×2 IMPLANT
TRAY FOLEY MTR SLVR 16FR STAT (SET/KITS/TRAYS/PACK) IMPLANT
WATER STERILE IRR 1000ML POUR (IV SOLUTION) ×2 IMPLANT
YANKAUER SUCT BULB TIP NO VENT (SUCTIONS) ×2 IMPLANT

## 2018-07-15 NOTE — Discharge Instructions (Addendum)
° ° °  No lifting greater than 10 lbs. Avoid bending, stooping and twisting. Walk in house for first week them may start to get out slowly increasing distance up to one mile by 3 weeks post op. Keep incision dry for 3 days, may use tegaderm or similar water impervious dressing. Patients that take plavix do have drainage that is normal from their incision, keep a dry dressing on the incision until it is dry. Walking is your best exercise in house then after 2 weeks start going for walk at grocery store or outside or at any large store.

## 2018-07-15 NOTE — Brief Op Note (Signed)
07/15/2018  3:25 PM  PATIENT:  Stacy [name]  57 y.o. female  PRE-OPERATIVE DIAGNOSIS:  left L3-4 herniated disc with left L3 radiculopathy  POST-OPERATIVE DIAGNOSIS:  left L3-4 herniated disc with left L3 radiculopathy  PROCEDURE:  Procedure(s): LEFT L3-4 MICRODISCECTOMY (N/A)  SURGEON:  Surgeon(s) and Role:    * [name], MD - Primary  PHYSICIAN ASSISTANT: Esaw Grandchild  ANESTHESIA:   local and general  EBL:  100 mL   BLOOD ADMINISTERED:none  DRAINS: none   LOCAL MEDICATIONS USED:  MARCAINE 0.5% 1:1 EXPAREL 1.3%    Amount: 20 ml  SPECIMEN:  No Specimen  DISPOSITION OF SPECIMEN:  N/A  COUNTS:  YES  TOURNIQUET:  * No tourniquets in log *  DICTATION: .Dragon Dictation  PLAN OF CARE: Admit for overnight observation  PATIENT DISPOSITION:  PACU - hemodynamically stable.   Delay start of Pharmacological VTE agent (>24hrs) due to surgical blood loss or risk of bleeding: yes

## 2018-07-15 NOTE — H&P (Signed)
PREOPERATIVE H&P  Chief Complaint: left L3-4 herniated disc with left L3 radiculopathy  HPI: Stacy [name] is a 58 y.o. female who presents for preoperative history and physical with a diagnosis of left L3-4 herniated disc with left L3 radiculopathy. Symptoms are rated as moderate to severe, and have been worsening.  This is significantly impairing activities of daily living.  She has elected for surgical management.   Past Medical History:  Diagnosis Date  . Arthritis   . Arthrofibrosis of total knee replacement ([place])    right  . Asthma   . COPD (chronic obstructive pulmonary disease) ([place])   . Diabetes mellitus    Type II  . GERD (gastroesophageal reflux disease)    Pt on Protonix daily  . Glaucoma   . Gout   . Headache(784.0)    otc meds prn  . Hyperlipidemia   . Hypertension   . Irritable bowel syndrome 11/19/2010  . Neuropathy   . Pneumonia   . Restless legs   . Shortness of breath    07/14/2018- uses  4 times a day   Past Surgical History:  Procedure Laterality Date  . CHOLECYSTECTOMY    . COLONOSCOPY    . ENDOMETRIAL ABLATION  10/2010  . HERNIA REPAIR     umbicial hernia  . KNEE ARTHROSCOPY Left    06/07/2017 Dr. Marlou Sa of [facility]  . KNEE CLOSED REDUCTION Right 12/06/2015   Procedure: CLOSED MANIPULATION RIGHT KNEE;  Surgeon: [name], MD;  Location: [place];  Service: Orthopedics;  Laterality: Right;  . KNEE CLOSED REDUCTION Right 01/17/2016   Procedure: CLOSED MANIPULATION RIGHT KNEE;  Surgeon: [name], MD;  Location: [place];  Service: Orthopedics;  Laterality: Right;  . KNEE JOINT MANIPULATION Right 12/06/2015  . LUMBAR DISC SURGERY  06/03/2015   L 2  L3 L4 L5   . LUMBAR LAMINECTOMY/DECOMPRESSION MICRODISCECTOMY N/A 06/03/2015   Procedure: Bilateral lateral recess decompression L2-3, L3-4, L4-5;  Surgeon: [name], MD;  Location: [place];  Service: Orthopedics;  Laterality: N/A;  . LUMBAR LAMINECTOMY/DECOMPRESSION MICRODISCECTOMY N/A  10/02/2016   Procedure: Right L5-S1 Lateral Recess Decompression  microdiscectomy;  Surgeon: [name], MD;  Location: [place];  Service: Orthopedics;  Laterality: N/A;  . svd      x 2  . TOTAL KNEE ARTHROPLASTY Right 09/06/2015   Procedure: RIGHT TOTAL KNEE ARTHROPLASTY;  Surgeon: [name], MD;  Location: [place];  Service: Orthopedics;  Laterality: Right;  . TUBAL LIGATION    . UPPER GASTROINTESTINAL ENDOSCOPY  04/28/11   Social History   Socioeconomic History  . Marital status: Married    Spouse name: Not on file  . Number of children: 2  . Years of education: Not on file  . Highest education level: Not on file  Occupational History  . Occupation: unemployed    Fish farm manager: UNEMPLOYED  Social Needs  . Financial resource strain: Not on file  . Food insecurity    Worry: Not on file    Inability: Not on file  . Transportation needs    Medical: Not on file    Non-medical: Not on file  Tobacco Use  . Smoking status: Current Some Day Smoker    Packs/day: 0.25    Years: 32.00    Pack years: 8.00    Types: Cigarettes  . Smokeless tobacco: Never Used  Substance and Sexual Activity  . Alcohol use: No  . Drug use: No  . Sexual activity: Yes  Birth control/protection: Surgical  Lifestyle  . Physical activity    Days per week: Not on file    Minutes per session: Not on file  . Stress: Not on file  Relationships  . Social Herbalist on phone: Not on file    Gets together: Not on file    Attends religious service: Not on file    Active member of club or organization: Not on file    Attends meetings of clubs or organizations: Not on file    Relationship status: Not on file  Other Topics Concern  . Not on file  Social History Narrative  . Not on file   Family History  Problem Relation Age of Onset  . Hypertension Father   . Cancer Father   . Heart disease Mother   . Asthma Son        had as a child  . Heart disease Sister   . Breast cancer Sister   .  Hypertension Brother    Allergies  Allergen Reactions  . Other Shortness Of Breath    UNSPECIFIED AGENTS Allergic to perfumes and cleaning products  . Ace Inhibitors Cough and Other (See Comments)       . Shellfish Allergy Other (See Comments)    Per allergy test.  . Aspirin Nausea Only   Prior to Admission medications   Medication Sig Start Date End Date Taking? Authorizing Provider  albuterol (PROVENTIL) (2.5 MG/3ML) 0.083% nebulizer solution Take 3 mLs (2.5 mg total) by nebulization every 6 (six) hours as needed for wheezing. 05/19/18  Yes [name], MD  allopurinol (ZYLOPRIM) 100 MG tablet TAKE 1 TABLET (100 MG TOTAL) BY MOUTH DAILY. 07/14/18  Yes [name], MD  budesonide-formoterol (SYMBICORT) 80-4.5 MCG/ACT inhaler INHALE TWO PUFFS BY MOUTH TWICE A DAY Patient taking differently: Inhale 2 puffs into the lungs 2 (two) times a day.  05/19/18  Yes [name], MD  celecoxib (CELEBREX) 200 MG capsule Take 1 capsule (200 mg total) by mouth 2 (two) times daily. 01/06/18  Yes [name], MD  clopidogrel (PLAVIX) 75 MG tablet Take 1 tablet (75 mg total) by mouth daily. 05/19/18  Yes [name], MD  cromolyn (OPTICROM) 4 % ophthalmic solution Place 1 drop into both eyes 4 (four) times daily.  05/02/18  Yes [provider]  diclofenac sodium (VOLTAREN) 1 % GEL Apply 4 g topically 4 (four) times daily. 05/06/18  Yes [name], MD  EPINEPHrine 0.3 mg/0.3 mL IJ SOAJ injection 0.3 mg IM x 1 PRN for allergic reaction Patient taking differently: Inject 0.3 mg into the muscle as needed for anaphylaxis.  05/17/17  Yes [name], MD  fluticasone (FLONASE) 50 MCG/ACT nasal spray Place 1 spray into both nostrils daily. 06/29/17  Yes [name], MD  gabapentin (NEURONTIN) 300 MG capsule Take 1 capsule (300 mg total) by mouth 3 (three) times daily. 01/06/18  Yes [name], MD  glimepiride (AMARYL) 2 MG tablet Take 1 tablet (2 mg total) by mouth  daily. 05/19/18  Yes [name], MD  hydrochlorothiazide (HYDRODIURIL) 25 MG tablet Take 1 tablet (25 mg total) by mouth daily. 05/19/18  Yes [name], MD  HYDROcodone-acetaminophen (NORCO/VICODIN) 5-325 MG tablet Take 1 tablet by mouth every 6 (six) hours as needed. Patient taking differently: Take 1 tablet by mouth every 6 (six) hours as needed (pain).  06/03/18  Yes [name], MD  hydrOXYzine (ATARAX/VISTARIL)  10 MG tablet TAKE 1 TABLET IN THE MORNING AND NOON AND 2 TABLETS IN THE EVENING AS NEEDED Patient taking differently: Take 10-20 mg by mouth See admin instructions. TAKE 1 TABLET IN THE MORNING AND NOON AND 2 TABLETS IN THE EVENING AS NEEDED for anxiety or itching 05/12/18  Yes [name], MD  loratadine (CLARITIN) 10 MG tablet Take 1 tablet (10 mg total) by mouth daily. 05/19/18  Yes [name], MD  Melatonin 3 MG TABS Take 1 tablet (3 mg total) by mouth at bedtime as needed. Patient taking differently: Take 3 mg by mouth at bedtime as needed (sleep).  11/11/17  Yes [name], MD  methocarbamol (ROBAXIN) 500 MG tablet Take 1 tablet (500 mg total) by mouth every 8 (eight) hours as needed for muscle spasms. 05/19/18  Yes [name], MD  MITIGARE 0.6 MG CAPS Take 1 capsule by mouth daily. 01/20/17  Yes Jegede, Olugbemiga E, MD  montelukast (SINGULAIR) 10 MG tablet Take 10 mg by mouth at bedtime.  02/09/18  Yes [provider]  Multiple Vitamin (MULTIVITAMIN WITH MINERALS) TABS tablet Take 1 tablet by mouth daily.   Yes [provider]  nicotine (NICODERM CQ - DOSED IN MG/24 HOURS) 14 mg/24hr patch PLACE 1 PATCH ONTO THE SKIN DAILY Patient taking differently: Place 14 mg onto the skin daily.  05/19/18  Yes [name], MD  pantoprazole (PROTONIX) 40 MG tablet Take 1 tablet (40 mg total) by mouth daily. 05/19/18  Yes [name], MD  Potassium Chloride ER 20 MEQ TBCR Take 20 mEq by mouth daily. 05/19/18  Yes [name], MD  simvastatin (ZOCOR) 20 MG tablet Take 20 mg by mouth at bedtime. 06/14/18  Yes [provider]  sucralfate (CARAFATE) 1 g tablet Take 1 g by mouth 4 (four) times daily -  with meals and at bedtime.   Yes [provider]  tiotropium (SPIRIVA HANDIHALER) 18 MCG inhalation capsule Place 1 capsule (18 mcg total) into inhaler and inhale daily. 02/16/18  Yes [name], MD  valsartan-hydrochlorothiazide (DIOVAN-HCT) 160-12.5 MG tablet Take 1 tablet by mouth daily. Must keep upcoming appt for refills. Patient taking differently: Take 1 tablet by mouth daily.  05/19/18  Yes [name], MD  ACCU-CHEK AVIVA PLUS test strip USE 3 TIMES A DAY AS DIRECTED BY PHYSICIAN 12/03/17   [name], MD  ACCU-CHEK SOFTCLIX LANCETS lancets USE AS INSTRUCTED 3 TIMES A DAY 12/03/17   [name], MD  Blood Glucose Monitoring Suppl (ACCU-CHEK AVIVA PLUS) w/Device KIT 1 each by Does not apply route 3 (three) times daily. 10/20/17   Argentina Donovan, PA-C  Lancets (ACCU-CHEK SOFT TOUCH) lancets Use as instructed 10/20/17   Argentina Donovan, PA-C  Respiratory Therapy Supplies (FLUTTER) DEVI Use after breathing treatment 4 times daily 06/06/18   Elsie Stain, MD  Fluticasone-Salmeterol (ADVAIR) 500-50 MCG/DOSE AEPB Inhale 1 puff into the lungs every 12 (twelve) hours.    04/16/11  [provider]  lisinopril (PRINIVIL,ZESTRIL) 40 MG tablet Take 40 mg by mouth daily.    04/16/11  [provider]     Positive ROS: All other systems have been reviewed and were otherwise negative with the exception of those mentioned in the HPI and as above.  Physical Exam: General: Alert, no acute distress Cardiovascular: No pedal edema Respiratory: No cyanosis, no use of accessory musculature GI: No organomegaly, abdomen is soft and non-tender Skin: No  lesions in the area of chief complaint Neurologic: Sensation intact distally Psychiatric: Patient is competent  for consent with normal mood and affect Lymphatic: No axillary or cervical lymphadenopathy  MUSCULOSKELETAL: Positive left reverse SLR, weak left quadriceps 4/5 and left foot DF 5-/5. Positive left SLR.  Right leg normal motor and sensation.   Assessment: left L3-4 herniated disc with left L3 and L4 radiculopathy  Plan: Plan for Procedure(s): LEFT L3-4 MICRODISCECTOMY  The risks benefits and alternatives were discussed with the patient including but not limited to the risks of nonoperative treatment, versus surgical intervention including infection, bleeding, nerve injury,  blood clots, cardiopulmonary complications, morbidity, mortality, among others, and they were willing to proceed.   Basil Dess, MD Cell [phone] Office [phone] 07/15/2018 1:01 PM

## 2018-07-15 NOTE — Op Note (Addendum)
07/15/2018  3:28 PM  PATIENT:  Stacy [name]  58 y.o. female  MRN: [id number]  OPERATIVE REPORT  PRE-OPERATIVE DIAGNOSIS:  left L3-4 herniated disc with left L3 radiculopathy  POST-OPERATIVE DIAGNOSIS:  left L3-4 herniated disc with left L3 radiculopathy  PROCEDURE:  Procedure(s): LEFT L3-4 MICRODISCECTOMY    SURGEON:  [name], MD     ASSISTANT:  Benjiman Core, PA-C  (Present throughout the entire procedure and necessary for completion of procedure in a timely manner)     ANESTHESIA:  General,    COMPLICATIONS:  None.     EBL: 100CC  PROCEDURE:The patient was met in the holding area, and the appropriate Left Lumbar level L3-4 identified and marked with "x" and my initials.The patient was then transported to OR and was placed under general anesthesia without difficulty. The patient received appropriate preoperative antibiotic prophylaxis. The patient after intubation atraumatically was transferred to the operating room table, prone position, Wilson frame, sliding OR table. All pressure points were well padded. The arms in 90-90 well-padded at the elbows. Standard prep with CHG solution lower dorsal spine to the mid sacral segment. Draped in the usual manner clear Vi-Drape was used. Time-out procedure was called and correct. 2x 18-gauge spinal needle was then inserted at the expected L3-4 level. C-arm was draped sterilely to the field and used to identify the spinal needles positions. The lower needle was at the lower aspect of the lamina of L3. Skin superior to this was then infiltrated with Marcaine half percent  1:1 exparel 1.3% epinephrine total of 20 cc used. An incision approximately an inch inch and a half in length was then made through skin and subcutaneous layers in line with the left side of the expected midline just superior to the spinal needle entry point. An incision made into the left lumbosacral fascia approximately an inch in length .  Boss McCoullough  retractor was then introduced into the incision site and used to perform soft tissue retraction at L3-4 level. The depth measured off of the dilators at about 60 mm and 60 mm retractors then placed on the scaffolding for the MIS equipment and guided over down to and docking on the posterior aspect of the left lamina at the expected L3-4 level. This was sterilely attached to the articulating arm and it's up right which had been attached the OR table sterilely. C-arm fluoroscopy was identified the dilators and the retractors at the appropriate level L3-4 The operating room microscope sterilely draped brought into the field. Under the operating room microscope, the L3-4 interspace carefully debrided the small amount of muscle attachment here and high-speed bur used to drill the medial aspect of the inferior articular process of L3 approximately 20%. A localization lateral C-arm view was obtained with Penfield 4 in the L3-4 facet. 2 mm Kerrison then used to enter the spinal canal over the superior aspect of the L4 lamina carefully using the Kerrison to debris the attachment as a curet. Foraminotomy was then performed over the left L4 nerve root. The medial 25%% superior articular process of L4 and then resected using 2 mm Kerrison. This allowed for identification of the thecal sac. Penfield 4 was then used to carefully mobilize the thecal sac medially and the L4 nerve root identified within the lateral recess flattened over the posterior aspect of the protruded disc. Carefully the lateral aspect of the L4 nerve root was identified and a Penfield 4 was used to mobilize the nerve medially such that the protruded  disc was visible with microscope. Using a Penfield 4 for retraction and a 15 blade scalpel was used to incise the posterior longitudinal ligament within the lateral recess on the left side longitudinally. Disc material was removed using micropituitary rongeurs and nerve hook nerve root and then more easily able to  be mobilized medially and retracted using a Derricho retractor. Further foraminotomies was performed over the L4 nerve root the nerve root was noted to be free without further compression. The nerve root able to be retracted along the medial aspect of the L4 pedicle and disc material found to be subligamentous at this level was further resected current pituitary rongeurs.  Ligamentum flavum was further debrided superiorly to the level L3-4 disc. Had a moderate amount of further resection of the L3 lamina inferiorly and mediallywas performed. With this then the disc space at L3-4was easily visualized and entry into the disc at the sided disc herniation was possible using a Penfield 4 intraoperative Lateral radiograph was used to identify the L3-4 disc with the Penfield 4 In place just below the disc space. Micropituitary was used to further debride this material superficially from the posterior aspect of the intervertebral disc is posterior lateral aspect of the disc. Small amount of further disc material was found subligamentous extending laterally from the disc this was removed using micropituitary rongeurs into the left L3 neuroforamen additionally epstein  currettes were used to decompress the left L3 neuroforamen. Upbiting currettes were used to further remove reflected portions of the reflected portions of the medial L3-4 facet and portions of the superior portion of the L4 superior articular  Facet. Ligamentum flavum was debrided and lateral recess along the medial aspect L3-4 facet no further decompression was necessary. Ball tip nerve probe was then able to carefully palpate the neuroforamen for L3 and L4 finding these to be well decompressed. Bleeding was then controlled using thrombin-soaked Gelfoam small cottonoids. Small amount of bleeding within the soft tissue mass the laminotomy area was controlled using bipolar electrocautery. Irrigation was carried out using copious amounts of irrigant solution.  All Gelfoam were then removed. No significant active bleeding present at the time of removal. All instruments sponge counts were correct traction system was then carefully removed carefully rotating retractors with this withdrawal and only bipolar electrocautery of any small bleeders. Lumbodorsal fascia was then carefully approximated with interrupted 0 Vicryl sutures, UR 6 needle deep subcutaneous layers were approximated with interrupted 0 Vicryl sutures on UR 6 the appear subcutaneous layers approximated with interrupted 2-0 Vicryl sutures and the skin closed with stainless steel staples, then Mepilex bandage applied. Patient was then carefully returned to supine position on a stretcher, reactivated and extubated. He was then returned to recovery room in satisfactory condition. Benjiman Core PA-C perform the duties of assistant surgeon during this case. he was present from the beginning of the case to the end of the case assisting in transfer the patient from his stretcher to the OR table and back to the stretcher at the end of the case. Assisted in careful retraction and suction of the laminectomy site delicate neural structures operating under the operating room microscope. he performed closure of the incision from the fascia to the skin applying the dressing.     Basil Dess  07/15/2018, 3:28 PM

## 2018-07-15 NOTE — Anesthesia Postprocedure Evaluation (Signed)
Anesthesia Post Note  Patient: Stacy [name]  Procedure(s) Performed: LEFT L3-4 MICRODISCECTOMY (N/A )     Patient location during evaluation: PACU Anesthesia Type: General Level of consciousness: awake and alert Pain management: pain level controlled Vital Signs Assessment: post-procedure vital signs reviewed and stable Respiratory status: spontaneous breathing, nonlabored ventilation, respiratory function stable and patient connected to nasal cannula oxygen Cardiovascular status: blood pressure returned to baseline and stable Postop Assessment: no apparent nausea or vomiting Anesthetic complications: no    Last Vitals:  Vitals:   07/15/18 1630 07/15/18 1639  BP: (!) 144/89 (!) 152/92  Pulse: 83 79  Resp: 10 19  Temp:  36.7 C  SpO2: 94% 93%    Last Pain:  Vitals:   07/15/18 1639  TempSrc: Oral  PainSc:                  [name]

## 2018-07-15 NOTE — Transfer of Care (Signed)
Immediate Anesthesia Transfer of Care Note  Patient: Stacy [name]  Procedure(s) Performed: LEFT L3-4 MICRODISCECTOMY (N/A )  Patient Location: PACU  Anesthesia Type:General  Level of Consciousness: drowsy  Airway & Oxygen Therapy: Patient Spontanous Breathing and Patient connected to nasal cannula oxygen  Post-op Assessment: Report given to RN, Post -op Vital signs reviewed and stable and Patient moving all extremities  Post vital signs: Reviewed and stable  Last Vitals:  Vitals Value Taken Time  BP 151/90 07/15/18 1545  Temp    Pulse 87 07/15/18 1550  Resp 9 07/15/18 1550  SpO2 99 % 07/15/18 1550  Vitals shown include unvalidated device data.  Last Pain:  Vitals:   07/15/18 1059  TempSrc:   PainSc: 9       Patients Stated Pain Goal: 8 (73/53/29 [id number])  Complications: No apparent anesthesia complications

## 2018-07-16 ENCOUNTER — Other Ambulatory Visit (INDEPENDENT_AMBULATORY_CARE_PROVIDER_SITE_OTHER): Payer: Self-pay | Admitting: Specialist

## 2018-07-16 ENCOUNTER — Encounter (HOSPITAL_COMMUNITY): Payer: Self-pay | Admitting: Specialist

## 2018-07-16 DIAGNOSIS — M5116 Intervertebral disc disorders with radiculopathy, lumbar region: Secondary | ICD-10-CM | POA: Diagnosis not present

## 2018-07-16 DIAGNOSIS — M1710 Unilateral primary osteoarthritis, unspecified knee: Secondary | ICD-10-CM | POA: Diagnosis not present

## 2018-07-16 LAB — BASIC METABOLIC PANEL
Anion gap: 12 (ref 5–15)
BUN: 10 mg/dL (ref 6–20)
CO2: 24 mmol/L (ref 22–32)
Calcium: 9 mg/dL (ref 8.9–10.3)
Chloride: 100 mmol/L (ref 98–111)
Creatinine, Ser: 0.82 mg/dL (ref 0.44–1.00)
GFR calc Af Amer: >60 mL/min (ref >60)
GFR calc non Af Amer: >60 mL/min (ref >60)
Glucose, Bld: 145 mg/dL — ABNORMAL HIGH (ref 70–99)
Potassium: 4 mmol/L (ref 3.5–5.1)
Sodium: 136 mmol/L (ref 135–145)

## 2018-07-16 LAB — CBC
HCT: 31.2 % — ABNORMAL LOW (ref 36.0–46.0)
Hemoglobin: 10.4 g/dL — ABNORMAL LOW (ref 12.0–15.0)
MCH: 26.3 pg (ref 26.0–34.0)
MCHC: 33.3 g/dL (ref 30.0–36.0)
MCV: 79 fL — ABNORMAL LOW (ref 80.0–100.0)
Platelets: 264 10*3/uL (ref 150–400)
RBC: 3.95 MIL/uL (ref 3.87–5.11)
RDW: 15.3 % (ref 11.5–15.5)
WBC: 8.8 10*3/uL (ref 4.0–10.5)
nRBC: 0 % (ref 0.0–0.2)

## 2018-07-16 LAB — GLUCOSE, CAPILLARY
Glucose-Capillary: 206 mg/dL — ABNORMAL HIGH (ref 70–99)
Glucose-Capillary: 171 mg/dL — ABNORMAL HIGH (ref 70–99)

## 2018-07-16 MED ORDER — DOCUSATE SODIUM 100 MG PO CAPS: 100.0000 mg | ORAL_CAPSULE | Freq: Two times a day (BID) | ORAL | 0 refills | Status: DC

## 2018-07-16 MED ORDER — HYDROCODONE-ACETAMINOPHEN 10-325 MG PO TABS: 1.0000 | ORAL_TABLET | Freq: Four times a day (QID) | ORAL | 0 refills | Status: AC | PRN

## 2018-07-16 MED ORDER — METHOCARBAMOL 500 MG PO TABS: 500.0000 mg | ORAL_TABLET | Freq: Three times a day (TID) | ORAL | 1 refills | Status: DC | PRN

## 2018-07-16 NOTE — Progress Notes (Signed)
Spoke with case management and orders need for rolling walker for home and a 3 n 1. Will contact MD.

## 2018-07-16 NOTE — Discharge Summary (Signed)
Physician Discharge Summary      Patient ID: Stacy [name] [facility] MRN: [id number] DOB/AGE: 1960-10-30 58 y.o.  Admit date: 07/15/2018 Discharge date:   Admission Diagnoses:  Active Problems:   Status post lumbar laminectomy   Discharge Diagnoses:  Same  Past Medical History:  Diagnosis Date   Arthritis    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   Asthma    COPD (chronic obstructive pulmonary disease) (HCC)    Diabetes mellitus    Type II   GERD (gastroesophageal reflux disease)    Pt on Protonix daily   Glaucoma    Gout    Headache(784.0)    otc meds prn   Hyperlipidemia    Hypertension    Irritable bowel syndrome 11/19/2010   Neuropathy    Pneumonia    Restless legs    Shortness of breath    07/14/2018- uses  4 times a day    Surgeries: Procedure(s): LEFT L3-4 MICRODISCECTOMY on 07/15/2018   Consultants:   Discharged Condition: Improved  Hospital Course: Stacy [name] is an 58 y.o. female who was admitted 07/15/2018 with a chief complaint of No chief complaint on file. , and found to have a diagnosis of <principal problem not specified>.  She was brought to the operating room on 07/15/2018 and underwent the above named procedures.    She was given perioperative antibiotics:  Anti-infectives (From admission, onward)   Start     Dose/Rate Route Frequency Ordered Stop   07/15/18 2100  ceFAZolin (ANCEF) IVPB 2g/100 mL premix     2 g 200 mL/hr over 30 Minutes Intravenous Every 8 hours 07/15/18 1640 07/16/18 0554   07/15/18 1037  ceFAZolin (ANCEF) 2-4 GM/100ML-% IVPB    Note to Pharmacy: Nyoka Cowden   : cabinet override      07/15/18 1037 07/15/18 1335   07/15/18 1030  ceFAZolin (ANCEF) IVPB 2g/100 mL premix     2 g 200 mL/hr over 30 Minutes Intravenous On call to O.R. 07/15/18 1025 07/15/18 1335    Recovered in the PACU and was transferred to %North Bed 8 there she continued her recovery and was seen by PT on  POD#1. Her neurologic exam with normal motor both legs post op day #1, SLR negative and her preoperative pain in the left anterior thigh was resolved. She had minimal drainage expected as she remained on plavix during her surgical periop period. A new dressing applied on POD#1. Her vital signs were normal. After  Being seen with PT she was discharged home. Her prescription sent via escript to  [facility] for her husband to pick up prior to closing today.  She was given sequential compression devices and early ambulation for DVT prophylaxis.  She benefited maximally from their hospital stay and there were no complications.    Recent vital signs:  Vitals:   07/16/18 0728 07/16/18 0805  BP: 136/74   Pulse: 72   Resp: 16   Temp: 98.4 F (36.9 C)   SpO2: 99% 95%    Recent laboratory studies:  Results for orders placed or performed during the hospital encounter of 07/15/18  Surgical pcr screen   Specimen: Nasal Mucosa; Nasal Swab  Result Value Ref Range   MRSA, PCR NEGATIVE NEGATIVE   Staphylococcus aureus NEGATIVE NEGATIVE  CBC  Result Value Ref Range   WBC 6.8 4.0 - 10.5 K/uL   RBC 4.51 3.87 - 5.11 MIL/uL   Hemoglobin 11.9 (L) 12.0 - 15.0 g/dL  HCT 35.7 (L) 36.0 - 46.0 %   MCV 79.2 (L) 80.0 - 100.0 fL   MCH 26.4 26.0 - 34.0 pg   MCHC 33.3 30.0 - 36.0 g/dL   RDW 15.3 11.5 - 15.5 %   Platelets 293 150 - 400 K/uL   nRBC 0.0 0.0 - 0.2 %  Comprehensive metabolic panel  Result Value Ref Range   Sodium 140 135 - 145 mmol/L   Potassium 3.2 (L) 3.5 - 5.1 mmol/L   Chloride 103 98 - 111 mmol/L   CO2 23 22 - 32 mmol/L   Glucose, Bld 150 (H) 70 - 99 mg/dL   BUN 11 6 - 20 mg/dL   Creatinine, Ser 0.81 0.44 - 1.00 mg/dL   Calcium 9.5 8.9 - 10.3 mg/dL   Total Protein 7.7 6.5 - 8.1 g/dL   Albumin 4.1 3.5 - 5.0 g/dL   AST 22 15 - 41 U/L   ALT 17 0 - 44 U/L   Alkaline Phosphatase 107 38 - 126 U/L   Total Bilirubin 0.4 0.3 - 1.2 mg/dL   GFR calc non Af Amer >60 >60 mL/min   GFR  calc Af Amer >60 >60 mL/min   Anion gap 14 5 - 15  Protime-INR  Result Value Ref Range   Prothrombin Time 13.3 11.4 - 15.2 seconds   INR 1.0 0.8 - 1.2  APTT  Result Value Ref Range   aPTT 36 24 - 36 seconds  Hemoglobin A1c  Result Value Ref Range   Hgb A1c MFr Bld 7.3 (H) 4.8 - 5.6 %   Mean Plasma Glucose 162.81 mg/dL  Glucose, capillary  Result Value Ref Range   Glucose-Capillary 152 (H) 70 - 99 mg/dL   Comment 1 Notify RN    Comment 2 Document in Chart   Glucose, capillary  Result Value Ref Range   Glucose-Capillary 108 (H) 70 - 99 mg/dL  Glucose, capillary  Result Value Ref Range   Glucose-Capillary 152 (H) 70 - 99 mg/dL   Comment 1 Notify RN    Comment 2 Document in Chart   Basic Metabolic Panel  Result Value Ref Range   Sodium 136 135 - 145 mmol/L   Potassium 4.0 3.5 - 5.1 mmol/L   Chloride 100 98 - 111 mmol/L   CO2 24 22 - 32 mmol/L   Glucose, Bld 145 (H) 70 - 99 mg/dL   BUN 10 6 - 20 mg/dL   Creatinine, Ser 0.82 0.44 - 1.00 mg/dL   Calcium 9.0 8.9 - 10.3 mg/dL   GFR calc non Af Amer >60 >60 mL/min   GFR calc Af Amer >60 >60 mL/min   Anion gap 12 5 - 15  CBC  Result Value Ref Range   WBC 8.8 4.0 - 10.5 K/uL   RBC 3.95 3.87 - 5.11 MIL/uL   Hemoglobin 10.4 (L) 12.0 - 15.0 g/dL   HCT 31.2 (L) 36.0 - 46.0 %   MCV 79.0 (L) 80.0 - 100.0 fL   MCH 26.3 26.0 - 34.0 pg   MCHC 33.3 30.0 - 36.0 g/dL   RDW 15.3 11.5 - 15.5 %   Platelets 264 150 - 400 K/uL   nRBC 0.0 0.0 - 0.2 %  Glucose, capillary  Result Value Ref Range   Glucose-Capillary 127 (H) 70 - 99 mg/dL  Glucose, capillary  Result Value Ref Range   Glucose-Capillary 134 (H) 70 - 99 mg/dL  Glucose, capillary  Result Value Ref Range   Glucose-Capillary 206 (H) 70 -  99 mg/dL    Discharge Medications:   Allergies as of 07/16/2018      Reactions   Other Shortness Of Breath   UNSPECIFIED AGENTS Allergic to perfumes and cleaning products   Ace Inhibitors Cough, Other (See Comments)      Shellfish  Allergy Other (See Comments)   Per allergy test.   Aspirin Nausea Only      Medication List    STOP taking these medications   HYDROcodone-acetaminophen 5-325 MG tablet Commonly known as: NORCO/VICODIN Replaced by: HYDROcodone-acetaminophen 10-325 MG tablet     TAKE these medications   Accu-Chek Aviva Plus test strip Generic drug: glucose blood USE 3 TIMES A DAY AS DIRECTED BY PHYSICIAN   Accu-Chek Aviva Plus w/Device Kit 1 each by Does not apply route 3 (three) times daily.   accu-chek soft touch lancets Use as instructed   Accu-Chek Softclix Lancets lancets USE AS INSTRUCTED 3 TIMES A DAY   albuterol (2.5 MG/3ML) 0.083% nebulizer solution Commonly known as: PROVENTIL Take 3 mLs (2.5 mg total) by nebulization every 6 (six) hours as needed for wheezing.   allopurinol 100 MG tablet Commonly known as: ZYLOPRIM TAKE 1 TABLET (100 MG TOTAL) BY MOUTH DAILY.   budesonide-formoterol 80-4.5 MCG/ACT inhaler Commonly known as: Symbicort INHALE TWO PUFFS BY MOUTH TWICE A DAY What changed:   how much to take  how to take this  when to take this  additional instructions   celecoxib 200 MG capsule Commonly known as: CELEBREX Take 1 capsule (200 mg total) by mouth 2 (two) times daily.   clopidogrel 75 MG tablet Commonly known as: PLAVIX Take 1 tablet (75 mg total) by mouth daily.   cromolyn 4 % ophthalmic solution Commonly known as: OPTICROM Place 1 drop into both eyes 4 (four) times daily.   diclofenac sodium 1 % Gel Commonly known as: Voltaren Apply 4 g topically 4 (four) times daily.   docusate sodium 100 MG capsule Commonly known as: COLACE Take 1 capsule (100 mg total) by mouth 2 (two) times daily.   EPINEPHrine 0.3 mg/0.3 mL Soaj injection Commonly known as: EPI-PEN 0.3 mg IM x 1 PRN for allergic reaction What changed:   how much to take  how to take this  when to take this  reasons to take this  additional instructions   fluticasone 50  MCG/ACT nasal spray Commonly known as: FLONASE Place 1 spray into both nostrils daily.   Flutter Devi Use after breathing treatment 4 times daily   gabapentin 300 MG capsule Commonly known as: NEURONTIN Take 1 capsule (300 mg total) by mouth 3 (three) times daily.   glimepiride 2 MG tablet Commonly known as: AMARYL Take 1 tablet (2 mg total) by mouth daily.   hydrochlorothiazide 25 MG tablet Commonly known as: HYDRODIURIL Take 1 tablet (25 mg total) by mouth daily.   HYDROcodone-acetaminophen 10-325 MG tablet Commonly known as: NORCO Take 1-2 tablets by mouth every 6 (six) hours as needed for up to 7 days for severe pain ((score 7 to 10)). Replaces: HYDROcodone-acetaminophen 5-325 MG tablet   hydrOXYzine 10 MG tablet Commonly known as: ATARAX/VISTARIL TAKE 1 TABLET IN THE MORNING AND NOON AND 2 TABLETS IN THE EVENING AS NEEDED What changed: See the new instructions.   loratadine 10 MG tablet Commonly known as: CLARITIN Take 1 tablet (10 mg total) by mouth daily.   Melatonin 3 MG Tabs Take 1 tablet (3 mg total) by mouth at bedtime as needed. What changed: reasons to take  this   methocarbamol 500 MG tablet Commonly known as: ROBAXIN Take 1 tablet (500 mg total) by mouth every 8 (eight) hours as needed for muscle spasms. What changed: Another medication with the same name was added. Make sure you understand how and when to take each.   methocarbamol 500 MG tablet Commonly known as: ROBAXIN Take 1 tablet (500 mg total) by mouth every 8 (eight) hours as needed for muscle spasms. What changed: You were already taking a medication with the same name, and this prescription was added. Make sure you understand how and when to take each.   Mitigare 0.6 MG Caps Generic drug: Colchicine Take 1 capsule by mouth daily.   montelukast 10 MG tablet Commonly known as: SINGULAIR Take 10 mg by mouth at bedtime.   multivitamin with minerals Tabs tablet Take 1 tablet by mouth  daily.   nicotine 14 mg/24hr patch Commonly known as: NICODERM CQ - dosed in mg/24 hours PLACE 1 PATCH ONTO THE SKIN DAILY What changed:   how much to take  how to take this  when to take this  additional instructions   pantoprazole 40 MG tablet Commonly known as: PROTONIX Take 1 tablet (40 mg total) by mouth daily.   Potassium Chloride ER 20 MEQ Tbcr Take 20 mEq by mouth daily.   simvastatin 20 MG tablet Commonly known as: ZOCOR Take 20 mg by mouth at bedtime.   sucralfate 1 g tablet Commonly known as: CARAFATE Take 1 g by mouth 4 (four) times daily -  with meals and at bedtime.   tiotropium 18 MCG inhalation capsule Commonly known as: Spiriva HandiHaler Place 1 capsule (18 mcg total) into inhaler and inhale daily.   valsartan-hydrochlorothiazide 160-12.5 MG tablet Commonly known as: DIOVAN-HCT Take 1 tablet by mouth daily. Must keep upcoming appt for refills. What changed: additional instructions       Diagnostic Studies: Dg Chest 2 View  Result Date: 07/15/2018 CLINICAL DATA:  Preop.  Smoker. EXAM: CHEST - 2 VIEW COMPARISON:  07/28/2017. FINDINGS: The heart size and mediastinal contours are within normal limits. Both lungs are clear. The visualized skeletal structures are unremarkable. IMPRESSION: Stable chest.  No active disease. Electronically Signed   By: Staci Righter M.D.   On: 07/15/2018 11:15   Dg Lumbar Spine 2-3 Views  Result Date: 07/15/2018 CLINICAL DATA:  Intraoperative localization for spine surgery. EXAM: LUMBAR SPINE - 2-3 VIEW COMPARISON:  Lumbar spine MRI 04/20/2018 FINDINGS: Lateral lumbar spine film from the operating room navel lumbar 1 demonstrates spinal needles marking the L2 and L3 spinous processes. Film labeled 2 demonstrates surgical retractors and a surgical instrument marking the L4 level. IMPRESSION: Intraoperative localization for spine surgery. Electronically Signed   By: Marijo Sanes M.D.   On: 07/15/2018 15:48   Xr C-arm No  Report  Result Date: 06/21/2018 Please see Notes tab for imaging impression.   Disposition: Discharge disposition: 01-Home or Self Care       Discharge Instructions    Call MD / Call 911   Complete by: As directed    If you experience chest pain or shortness of breath, CALL 911 and be transported to the hospital emergency room.  If you develope a fever above 101 F, pus (white drainage) or increased drainage or redness at the wound, or calf pain, call your surgeon's office.   Constipation Prevention   Complete by: As directed    Drink plenty of fluids.  Prune juice may be helpful.  You may  use a stool softener, such as Colace (over the counter) 100 mg twice a day.  Use MiraLax (over the counter) for constipation as needed.   Diet - low sodium heart healthy   Complete by: As directed    Discharge instructions   Complete by: As directed    No lifting greater than 10 lbs. Avoid bending, stooping and twisting. Walk in house for first week them may start to get out slowly increasing distance up to one mile by 3 weeks post op. Keep incision dry for 3 days, may use tegaderm or similar water impervious dressing. Patients that take plavix do have drainage that is normal from their incision, keep a dry dressing on the incision until it is dry. Walking is your best exercise in house then after 2 weeks start going for walk at grocery store or outside or at any large store.   Driving restrictions   Complete by: As directed    No driving for 2 weeks   Increase activity slowly as tolerated   Complete by: As directed    Lifting restrictions   Complete by: As directed    No lifting for 8 weeks      Follow-up Information    [name], MD In 2 weeks.   Specialty: Orthopedic Surgery Why: For wound re-check Contact information: [place] [address] [phone]            Signed: Basil Dess 07/16/2018, 9:45 AM

## 2018-07-16 NOTE — Plan of Care (Signed)
  Problem: Health Behavior/Discharge Planning: Goal: Ability to manage health-related needs will improve Outcome: Progressing   Problem: Clinical Measurements: Goal: Will remain free from infection Outcome: Progressing   

## 2018-07-16 NOTE — TOC Progression Note (Signed)
Transition of Care [facility]) - Progression Note    Patient Details  Name: Marca Gadsby MRN: [id number] Date of Birth: 08/31/1960  [facility]) CM/SW Contact  Norina Buzzard, RN Phone Number: 07/16/2018, 1:01 PM  Clinical Narrative: 58 yo F s/p lumbar laminectomy. Received call from RN, she reports that pt needs a RW, 3-in-BSC and HH PT. Spoke with pt. She plans to return home with the support of her husband. Discussed [place] agency. She doesn't have a preference for a [place] agency. Will contact [facility] for [place] [facility] and [facility] for DME referral and she agreed with agencies. Contacted [facility] at [place] for DME and Corene Cornea at [place] for [facility] PTreferral.      Expected Discharge Plan: [place] Barriers to Discharge: No Barriers Identified  Expected Discharge Plan and Services Expected Discharge Plan: [place]   Discharge Planning Services: CM Consult Post Acute Care Choice: Home Health   Expected Discharge Date: 07/16/18               DME Arranged: 3-N-1, Gilford Rile rolling DME Agency: [facility] Date DME Agency Contacted: 07/16/18 Time DME Agency Contacted: [phone] Representative spoke with at DME Agency: [place]: PT [place]: [place] ([place]) Date [place]: 07/16/18 Time [place]: 1148 Representative spoke with at [place]: [place] ([place]) Interventions    Readmission Risk Interventions No flowsheet data found.

## 2018-07-16 NOTE — Progress Notes (Signed)
     Subjective: 1 Day Post-Op Procedure(s) (LRB): LEFT L3-4 MICRODISCECTOMY (N/A) Awake, Alert and oriented x 4. Some pain last night. Nursing did not stand? Some spasm last night. Has a passive urinary Cath? Patient reports pain as moderate.    Objective:   VITALS:  Temp:  [97 F (36.1 C)-98.6 F (37 C)] 98.4 F (36.9 C) (06/13 0728) Pulse Rate:  [59-103] 72 (06/13 0728) Resp:  [10-19] 16 (06/13 0728) BP: (108-159)/(62-100) 136/74 (06/13 0728) SpO2:  [93 %-100 %] 95 % (06/13 0805) Weight:  [83 kg] 83 kg (06/12 1033)  Neurologically intact ABD soft Neurovascular intact Sensation intact distally Intact pulses distally Dorsiflexion/Plantar flexion intact Incision: scant drainage   LABS Recent Labs    07/15/18 1031 07/16/18 0415  HGB 11.9* 10.4*  WBC 6.8 8.8  PLT 293 264   Recent Labs    07/15/18 1031 07/16/18 0415  NA 140 136  K 3.2* 4.0  CL 103 100  CO2 23 24  BUN 11 10  CREATININE 0.81 0.82  GLUCOSE 150* 145*   Recent Labs    07/15/18 1031  INR 1.0     Assessment/Plan: 1 Day Post-Op Procedure(s) (LRB): LEFT L3-4 MICRODISCECTOMY (N/A)  Advance diet Up with therapy D/C IV fluids Discharge home with home health  Basil Dess 07/16/2018, 9:33 AMPatient ID: Stacy [name], female   DOB: [birth year], 59 y.o.   MRN: [id number]

## 2018-07-16 NOTE — Evaluation (Signed)
Occupational Therapy Evaluation [facility] Details [name]: [id number] DOB: 11-Aug-1960 Today's Date: 07/16/2018    History of Present Illness Pt is a 58 y.o. female s/p lumbar laminectomy. PMH consists of COPD, DM2 with neuropathy, R TKA, gout, OA, asthma, GERD and previous back sx.   Clinical Impression   PTA patient reports independent using rollator/cane for mobility.  Admitted for above and limited by problem list below, including precautions, pain, balance and activity toelrance. She has been educated on back precautions, ADL compensatory techniques, recommendations, DME, AE and safety.  Requires min assist for LB ADLs using AE (after education on use), min guard for transfers using RW, and min guard for grooming at sink.  Requires cueing to recall and adhere to precautions during session.  Issued LB AE during session and educated on use (reacher, sock aide, shoe horn, long handled sponge). Will benefit from continued OT services [facility] to maximize independence and safety with ADLs/mobility and return to PLOF. Will follow.     Follow Up Recommendations  Home health OT;Supervision/Assistance - 24 hour    Equipment Recommendations  3 in 1 bedside commode    Recommendations for Other Services       Precautions / Restrictions Precautions Precautions: Back Precaution Booklet Issued: Yes (comment) Precaution Comments: reviewed precautions, but requires cueing to adhere to during seson Required Braces or Orthoses: (no brace needed order ) Restrictions Weight Bearing Restrictions: No Other Position/Activity Restrictions: no brace      Mobility Bed Mobility Overal bed mobility: Needs Assistance Bed Mobility: Rolling;Sidelying to Sit Rolling: Modified independent (Device/Increase time) Sidelying to sit: Min guard;HOB elevated       General bed mobility comments: OOB on commode upon entry  Transfers Overall transfer  level: Needs assistance Equipment used: Rolling walker (2 wheeled) Transfers: Sit to/from Stand Sit to Stand: Min guard Stand pivot transfers: Min guard       General transfer comment: min guard for safety/balance, cueing for hand placement    Balance Overall balance assessment: Mild deficits observed, not formally tested;History of Falls                                         ADL either performed or assessed with clinical judgement   ADL Overall ADL's : Needs assistance/impaired     Grooming: Min guard;Standing   Upper Body Bathing: Set up;Sitting   Lower Body Bathing: Minimal assistance;Sit to/from stand;Cueing for compensatory techniques;With adaptive equipment;Cueing for back precautions   Upper Body Dressing : Set up;Sitting   Lower Body Dressing: Minimal assistance;Cueing for compensatory techniques;With adaptive equipment;Sit to/from stand;Cueing for back precautions Lower Body Dressing Details (indicate cue type and reason): issued and educated on use of LB AE for dressing , min guard sit<>stand Toilet Transfer: Min guard;Ambulation;RW;Grab bars Toilet Transfer Details (indicate cue type and reason): cueing for hand placement and technique Toileting- Clothing Manipulation and Hygiene: Min guard;Sit to/from stand       Functional mobility during ADLs: Rolling walker;Min guard General ADL Comments: pt limited by back pain, precautions, and act tolerance     Vision         Perception     Praxis      Pertinent Vitals/Pain Pain Assessment: Faces Pain Score: 3  Faces Pain Scale: Hurts little more Pain Location: back Pain Descriptors / Indicators: Sore;Operative site guarding Pain  Intervention(s): Monitored during session;Repositioned     Hand Dominance Left   Extremity/Trunk Assessment Upper Extremity Assessment Upper Extremity Assessment: Overall WFL for tasks assessed   Lower Extremity Assessment Lower Extremity Assessment: Defer  to PT evaluation   Cervical / Trunk Assessment Cervical / Trunk Assessment: Other exceptions Cervical / Trunk Exceptions: s/p lumbar lami   Communication Communication Communication: No difficulties   Cognition Arousal/Alertness: Awake/alert Behavior During Therapy: WFL for tasks assessed/performed Overall Cognitive Status: Within Functional Limits for tasks assessed                                     General Comments       Exercises     Shoulder Instructions      Home Living Family/patient expects to be discharged to:: Private residence Living Arrangements: Spouse/significant other Available Help at Discharge: Family;Friend(s);Available 24 hours/day Type of Home: House Home Access: Stairs to enter [employer] of Steps: 2 Entrance Stairs-Rails: None Home Layout: Two level;Able to live on main level with bedroom/bathroom     Bathroom Shower/Tub: Tub/shower unit(does not use )   Bathroom Toilet: Standard Bathroom Accessibility: Yes   Home Equipment: Walker - 4 wheels;Cane - single point;Hospital bed          Prior Functioning/Environment Level of Independence: Independent with assistive device(s)        Comments: cane vs rollator for amb. Uses scat bus or neighbor for transportation. Utilizes motorized scooter in grocery store. Active with OPPT PTA.        OT Problem List: Decreased strength;Decreased activity tolerance;Impaired balance (sitting and/or standing);Pain;Decreased safety awareness;Decreased knowledge of use of DME or AE;Decreased knowledge of precautions      OT Treatment/Interventions: Self-care/ADL training;DME and/or AE instruction;Therapeutic activities;Patient/family education;Balance training    OT Goals(Current goals can be found in the care plan section) Acute Rehab OT Goals Patient Stated Goal: home today OT Goal Formulation: With patient Time For Goal Achievement: 07/30/18 Potential to Achieve Goals: Good   OT Frequency: Min 2X/week   Barriers to D/C:            Co-evaluation              AM-PAC OT "6 Clicks" Daily Activity     Outcome Measure Help from another person eating meals?: None Help from another person taking care of personal grooming?: A Little Help from another person toileting, which includes using toliet, bedpan, or urinal?: A Little Help from another person bathing (including washing, rinsing, drying)?: A Little Help from another person to put on and taking off regular upper body clothing?: None Help from another person to put on and taking off regular lower body clothing?: A Little 6 Click Score: 20   End of Session Equipment Utilized During Treatment: Rolling walker Nurse Communication: Mobility status  Activity Tolerance: Patient tolerated treatment well Patient left: in chair;with call bell/phone within reach  OT Visit Diagnosis: Unsteadiness on feet (R26.81);Pain Pain - part of body: (back)                Time: 8341-9622 OT Time Calculation (min): 23 min Charges:  OT General Charges $OT Visit: 1 Visit OT Evaluation $OT Eval Moderate Complexity: 1 Mod OT Treatments $Self Care/Home Management : 8-22 mins  Delight Stare, OT Acute Rehabilitation Services Pager [phone] Office [phone]   Delight Stare 07/16/2018, 11:10 AM

## 2018-07-16 NOTE — Evaluation (Signed)
Physical Therapy Evaluation Patient Details Name: Stacy [name] Stacy [name] MRN: [id number] DOB: Oct 23, 1960 Today's Date: 07/16/2018   History of Present Illness  Pt is a 57 y.o. female s/p lumbar laminectomy. PMH consists of COPD, DM2 with neuropathy, R TKA, gout, OA, asthma, GERD and previous back sx.    Clinical Impression  PT eval complete. Pt required min guard assist bed mobility, transfers and ambulation 200 feet with RW. Stair training complete. Pt educated on 3/3 back precautions. Handout provided. Pt to d/c home today.    Follow Up Recommendations Home health PT;Supervision for mobility/OOB    Equipment Recommendations  Rolling walker with 5" wheels;3in1 (PT)    Recommendations for Other Services       Precautions / Restrictions Precautions Precautions: Back Precaution Comments: Pt educated on 3/3 back precaution. Handout provided. Restrictions Other Position/Activity Restrictions: no brace      Mobility  Bed Mobility Overal bed mobility: Needs Assistance Bed Mobility: Rolling;Sidelying to Sit Rolling: Modified independent (Device/Increase time) Sidelying to sit: Min guard;HOB elevated       General bed mobility comments: +rail, cues for logroll, increased time and effort  Transfers Overall transfer level: Needs assistance Equipment used: Rolling walker (2 wheeled) Transfers: Sit to/from [facility] Sit to Stand: Min guard Stand pivot transfers: Min guard       General transfer comment: cues for hand placement  Ambulation/Gait Ambulation/Gait assistance: Min guard Gait Distance (Feet): 200 Feet Assistive device: Rolling walker (2 wheeled) Gait Pattern/deviations: Step-through pattern;Decreased stride length Gait velocity: decreased Gait velocity interpretation: 1.31 - 2.62 ft/sec, indicative of limited community ambulator General Gait Details: slow, steady gait with RW  Stairs Stairs: Yes Stairs assistance: Min guard Stair  Management: Two rails;Forwards;Step to pattern Number of Stairs: 3 General stair comments: cues for sequencing  Wheelchair Mobility    Modified Rankin (Stroke Patients Only)       Balance Overall balance assessment: Mild deficits observed, not formally tested;History of Falls                                           Pertinent Vitals/Pain Pain Assessment: 0-10 Pain Score: 3  Pain Location: back Pain Descriptors / Indicators: Sore;Operative site guarding Pain Intervention(s): Monitored during session;Repositioned    Home Living Family/patient expects to be discharged to:: Private residence Living Arrangements: Spouse/significant other Available Help at Discharge: Family;Friend(s);Available 24 hours/day Type of Home: House Home Access: Stairs to enter Entrance Stairs-Rails: None Entrance Stairs-Number of Steps: 2 Home Layout: Two level;Able to live on main level with bedroom/bathroom Home Equipment: Walker - 4 wheels;Cane - single point;[name] bed      Prior Function Level of Independence: Independent with assistive device(s)         Comments: cane vs rollator for amb. Uses scat bus or neighbor for transportation. Utilizes motorized scooter in grocery store. Active with [facility] PTA.     Hand Dominance        Extremity/Trunk Assessment   Upper Extremity Assessment Upper Extremity Assessment: Overall WFL for tasks assessed    Lower Extremity Assessment Lower Extremity Assessment: Generalized weakness    Cervical / Trunk Assessment Cervical / Trunk Assessment: Other exceptions Cervical / Trunk Exceptions: s/p lumbar lami  Communication   Communication: No difficulties  Cognition Arousal/Alertness: Awake/alert Behavior During Therapy: WFL for tasks assessed/performed Overall Cognitive Status: Within Functional Limits for tasks assessed  General Comments      Exercises      Assessment/Plan    PT Assessment All further PT needs can be met in the next venue of care  PT Problem List Decreased strength;Decreased mobility;Decreased knowledge of precautions;Decreased activity tolerance;Decreased balance;Pain       PT Treatment Interventions      PT Goals (Current goals can be found in the Care Plan section)  Acute Rehab PT Goals Patient Stated Goal: home today PT Goal Formulation: All assessment and education complete, DC therapy    Frequency     Barriers to discharge        Co-evaluation               AM-PAC PT "6 Clicks" Mobility  Outcome Measure Help needed turning from your back to your side while in a flat bed without using bedrails?: A Little Help needed moving from lying on your back to sitting on the side of a flat bed without using bedrails?: A Little Help needed moving to and from a bed to a chair (including a wheelchair)?: A Little Help needed standing up from a chair using your arms (e.g., wheelchair or bedside chair)?: A Little Help needed to walk in [name] room?: A Little Help needed climbing 3-5 steps with a railing? : A Little 6 Click Score: 18    End of Session Equipment Utilized During Treatment: Gait belt Activity Tolerance: Patient tolerated treatment well Patient left: in chair;with call bell/phone within reach Nurse Communication: Mobility status PT Visit Diagnosis: Other abnormalities of gait and mobility (R26.89);Muscle weakness (generalized) (M62.81);Pain    Time: 1001-1028 PT Time Calculation (min) (ACUTE ONLY): 27 min   Charges:   PT Evaluation $PT Eval Low Complexity: 1 Low PT Treatments $Gait Training: 8-22 mins        Stacy [name], PT  Office # [phone] Pager [phone]   Stacy [name] 07/16/2018, 10:52 AM

## 2018-07-17 DIAGNOSIS — Z7984 Long term (current) use of oral hypoglycemic drugs: Secondary | ICD-10-CM | POA: Diagnosis not present

## 2018-07-17 DIAGNOSIS — E1140 Type 2 diabetes mellitus with diabetic neuropathy, unspecified: Secondary | ICD-10-CM | POA: Diagnosis not present

## 2018-07-17 DIAGNOSIS — K219 Gastro-esophageal reflux disease without esophagitis: Secondary | ICD-10-CM | POA: Diagnosis not present

## 2018-07-17 DIAGNOSIS — H409 Unspecified glaucoma: Secondary | ICD-10-CM | POA: Diagnosis not present

## 2018-07-17 DIAGNOSIS — J449 Chronic obstructive pulmonary disease, unspecified: Secondary | ICD-10-CM | POA: Diagnosis not present

## 2018-07-17 DIAGNOSIS — M5116 Intervertebral disc disorders with radiculopathy, lumbar region: Secondary | ICD-10-CM | POA: Diagnosis not present

## 2018-07-17 DIAGNOSIS — Z4789 Encounter for other orthopedic aftercare: Secondary | ICD-10-CM | POA: Diagnosis not present

## 2018-07-17 DIAGNOSIS — I10 Essential (primary) hypertension: Secondary | ICD-10-CM | POA: Diagnosis not present

## 2018-07-17 DIAGNOSIS — Z7902 Long term (current) use of antithrombotics/antiplatelets: Secondary | ICD-10-CM | POA: Diagnosis not present

## 2018-07-18 ENCOUNTER — Telehealth: Payer: Self-pay | Admitting: Specialist

## 2018-07-18 DIAGNOSIS — J301 Allergic rhinitis due to pollen: Secondary | ICD-10-CM | POA: Diagnosis not present

## 2018-07-18 DIAGNOSIS — J3089 Other allergic rhinitis: Secondary | ICD-10-CM | POA: Diagnosis not present

## 2018-07-18 DIAGNOSIS — J3081 Allergic rhinitis due to animal (cat) (dog) hair and dander: Secondary | ICD-10-CM | POA: Diagnosis not present

## 2018-07-18 MED FILL — Thrombin (Recombinant) For Soln 20000 Unit: CUTANEOUS | Qty: 1 | Status: AC

## 2018-07-18 NOTE — Telephone Encounter (Signed)
I called and lmom and gave verbal ok for requested orders for patient

## 2018-07-18 NOTE — Telephone Encounter (Signed)
Received voicemail message from Donnellson with [facility] needing verbal orders for HHPT 2 Wk 2, 1 Wk 2 for gait training,transfer training, Home exercise program OT Eval and shower bench. The number to contact Gerald Stabs is [phone]

## 2018-07-18 NOTE — Telephone Encounter (Signed)
Patient called advised the methocarbamol is not working and she is having really bad spasm in her left leg. The number to contact patient is [phone]

## 2018-07-19 NOTE — Telephone Encounter (Signed)
Patient called advised the methocarbamol is not working and she is having really bad spasm in her left leg.--please advise

## 2018-07-19 NOTE — Telephone Encounter (Signed)
For the next 24 hours she can change to Robaxin 1 to 2 tablets p.o. every 8 hours as needed for spasms.  If she is not getting better by tomorrow she can come in for evaluation.

## 2018-07-20 ENCOUNTER — Other Ambulatory Visit: Payer: Self-pay | Admitting: Radiology

## 2018-07-20 DIAGNOSIS — J449 Chronic obstructive pulmonary disease, unspecified: Secondary | ICD-10-CM | POA: Diagnosis not present

## 2018-07-20 DIAGNOSIS — M5116 Intervertebral disc disorders with radiculopathy, lumbar region: Secondary | ICD-10-CM | POA: Diagnosis not present

## 2018-07-20 DIAGNOSIS — I10 Essential (primary) hypertension: Secondary | ICD-10-CM | POA: Diagnosis not present

## 2018-07-20 DIAGNOSIS — Z7984 Long term (current) use of oral hypoglycemic drugs: Secondary | ICD-10-CM | POA: Diagnosis not present

## 2018-07-20 DIAGNOSIS — Z7902 Long term (current) use of antithrombotics/antiplatelets: Secondary | ICD-10-CM | POA: Diagnosis not present

## 2018-07-20 DIAGNOSIS — E1140 Type 2 diabetes mellitus with diabetic neuropathy, unspecified: Secondary | ICD-10-CM | POA: Diagnosis not present

## 2018-07-20 DIAGNOSIS — Z4789 Encounter for other orthopedic aftercare: Secondary | ICD-10-CM | POA: Diagnosis not present

## 2018-07-20 DIAGNOSIS — K219 Gastro-esophageal reflux disease without esophagitis: Secondary | ICD-10-CM | POA: Diagnosis not present

## 2018-07-20 DIAGNOSIS — H409 Unspecified glaucoma: Secondary | ICD-10-CM | POA: Diagnosis not present

## 2018-07-20 MED ORDER — METHOCARBAMOL 500 MG PO TABS: ORAL_TABLET | ORAL | 0 refills | Status: DC

## 2018-07-20 NOTE — Telephone Encounter (Signed)
I called and advised patient that I sent in her this rx

## 2018-07-21 ENCOUNTER — Telehealth: Payer: Self-pay | Admitting: Radiology

## 2018-07-21 ENCOUNTER — Telehealth: Payer: Self-pay | Admitting: Specialist

## 2018-07-21 DIAGNOSIS — F411 Generalized anxiety disorder: Secondary | ICD-10-CM | POA: Diagnosis not present

## 2018-07-21 NOTE — Telephone Encounter (Signed)
Chris with [facility]. [name] states he needs order for new hospital mattress for her bed. Please fax order to [phone]. Chris's callback #  [phone] advise

## 2018-07-21 NOTE — Telephone Encounter (Signed)
I do not think that this would be approved by insurance.  Patient had a single level discectomy.

## 2018-07-21 NOTE — Telephone Encounter (Signed)
I called rx into pharm   

## 2018-07-21 NOTE — Telephone Encounter (Signed)
Stacy [name] with [facility] [name] states he needs order for new hospital mattress for her bed. Please fax order to [phone]. Stacy [name]'s callback #  [phone]

## 2018-07-21 NOTE — Telephone Encounter (Signed)
hold and discuss with Dr. [name] next week

## 2018-07-21 NOTE — Telephone Encounter (Signed)
This message was taken in wrong chart ----- correct pt it Stacy [name] DOB:05-02-2060

## 2018-07-21 NOTE — Telephone Encounter (Signed)
Patient called advised the Rx for Robaxin is not at the pharmacy yet. Patient said she use [facility]. The number to contact patient is [phone] or [phone]

## 2018-07-25 DIAGNOSIS — J301 Allergic rhinitis due to pollen: Secondary | ICD-10-CM | POA: Diagnosis not present

## 2018-07-25 DIAGNOSIS — J3081 Allergic rhinitis due to animal (cat) (dog) hair and dander: Secondary | ICD-10-CM | POA: Diagnosis not present

## 2018-07-25 DIAGNOSIS — J3089 Other allergic rhinitis: Secondary | ICD-10-CM | POA: Diagnosis not present

## 2018-07-26 DIAGNOSIS — H409 Unspecified glaucoma: Secondary | ICD-10-CM | POA: Diagnosis not present

## 2018-07-26 DIAGNOSIS — E1140 Type 2 diabetes mellitus with diabetic neuropathy, unspecified: Secondary | ICD-10-CM | POA: Diagnosis not present

## 2018-07-26 DIAGNOSIS — Z7902 Long term (current) use of antithrombotics/antiplatelets: Secondary | ICD-10-CM | POA: Diagnosis not present

## 2018-07-26 DIAGNOSIS — J449 Chronic obstructive pulmonary disease, unspecified: Secondary | ICD-10-CM | POA: Diagnosis not present

## 2018-07-26 DIAGNOSIS — Z4789 Encounter for other orthopedic aftercare: Secondary | ICD-10-CM | POA: Diagnosis not present

## 2018-07-26 DIAGNOSIS — K219 Gastro-esophageal reflux disease without esophagitis: Secondary | ICD-10-CM | POA: Diagnosis not present

## 2018-07-26 DIAGNOSIS — Z7984 Long term (current) use of oral hypoglycemic drugs: Secondary | ICD-10-CM | POA: Diagnosis not present

## 2018-07-26 DIAGNOSIS — I10 Essential (primary) hypertension: Secondary | ICD-10-CM | POA: Diagnosis not present

## 2018-07-26 DIAGNOSIS — M5116 Intervertebral disc disorders with radiculopathy, lumbar region: Secondary | ICD-10-CM | POA: Diagnosis not present

## 2018-07-26 NOTE — Telephone Encounter (Signed)
Stacy [name] with [facility]. [name] states he needs order for new hospital mattress for her bed. Please fax order to [phone]. Stacy [name]'s callback #  [phone]----Please advise 

## 2018-07-28 ENCOUNTER — Encounter: Payer: Medicaid Other | Admitting: Physical Medicine & Rehabilitation

## 2018-07-29 DIAGNOSIS — F411 Generalized anxiety disorder: Secondary | ICD-10-CM | POA: Diagnosis not present

## 2018-07-29 DIAGNOSIS — Z7984 Long term (current) use of oral hypoglycemic drugs: Secondary | ICD-10-CM | POA: Diagnosis not present

## 2018-07-29 DIAGNOSIS — I10 Essential (primary) hypertension: Secondary | ICD-10-CM | POA: Diagnosis not present

## 2018-07-29 DIAGNOSIS — M5116 Intervertebral disc disorders with radiculopathy, lumbar region: Secondary | ICD-10-CM | POA: Diagnosis not present

## 2018-07-29 DIAGNOSIS — E1140 Type 2 diabetes mellitus with diabetic neuropathy, unspecified: Secondary | ICD-10-CM | POA: Diagnosis not present

## 2018-07-29 DIAGNOSIS — Z4789 Encounter for other orthopedic aftercare: Secondary | ICD-10-CM | POA: Diagnosis not present

## 2018-07-29 DIAGNOSIS — J449 Chronic obstructive pulmonary disease, unspecified: Secondary | ICD-10-CM | POA: Diagnosis not present

## 2018-07-29 DIAGNOSIS — Z7902 Long term (current) use of antithrombotics/antiplatelets: Secondary | ICD-10-CM | POA: Diagnosis not present

## 2018-07-29 DIAGNOSIS — H409 Unspecified glaucoma: Secondary | ICD-10-CM | POA: Diagnosis not present

## 2018-07-29 DIAGNOSIS — K219 Gastro-esophageal reflux disease without esophagitis: Secondary | ICD-10-CM | POA: Diagnosis not present

## 2018-08-01 DIAGNOSIS — E1140 Type 2 diabetes mellitus with diabetic neuropathy, unspecified: Secondary | ICD-10-CM | POA: Diagnosis not present

## 2018-08-01 DIAGNOSIS — I10 Essential (primary) hypertension: Secondary | ICD-10-CM | POA: Diagnosis not present

## 2018-08-01 DIAGNOSIS — J3089 Other allergic rhinitis: Secondary | ICD-10-CM | POA: Diagnosis not present

## 2018-08-01 DIAGNOSIS — M5116 Intervertebral disc disorders with radiculopathy, lumbar region: Secondary | ICD-10-CM | POA: Diagnosis not present

## 2018-08-01 DIAGNOSIS — J3081 Allergic rhinitis due to animal (cat) (dog) hair and dander: Secondary | ICD-10-CM | POA: Diagnosis not present

## 2018-08-01 DIAGNOSIS — K219 Gastro-esophageal reflux disease without esophagitis: Secondary | ICD-10-CM | POA: Diagnosis not present

## 2018-08-01 DIAGNOSIS — Z4789 Encounter for other orthopedic aftercare: Secondary | ICD-10-CM | POA: Diagnosis not present

## 2018-08-01 DIAGNOSIS — Z7902 Long term (current) use of antithrombotics/antiplatelets: Secondary | ICD-10-CM | POA: Diagnosis not present

## 2018-08-01 DIAGNOSIS — H409 Unspecified glaucoma: Secondary | ICD-10-CM | POA: Diagnosis not present

## 2018-08-01 DIAGNOSIS — J301 Allergic rhinitis due to pollen: Secondary | ICD-10-CM | POA: Diagnosis not present

## 2018-08-01 DIAGNOSIS — J449 Chronic obstructive pulmonary disease, unspecified: Secondary | ICD-10-CM | POA: Diagnosis not present

## 2018-08-01 DIAGNOSIS — Z7984 Long term (current) use of oral hypoglycemic drugs: Secondary | ICD-10-CM | POA: Diagnosis not present

## 2018-08-02 ENCOUNTER — Encounter: Payer: Self-pay | Admitting: Surgery

## 2018-08-02 ENCOUNTER — Other Ambulatory Visit: Payer: Self-pay

## 2018-08-02 ENCOUNTER — Inpatient Hospital Stay: Payer: Self-pay | Admitting: Surgery

## 2018-08-02 ENCOUNTER — Ambulatory Visit (INDEPENDENT_AMBULATORY_CARE_PROVIDER_SITE_OTHER): Payer: Medicaid Other | Admitting: Surgery

## 2018-08-02 VITALS — BP 155/74 | HR 83 | Ht 66.0 in | Wt 183.0 lb

## 2018-08-02 DIAGNOSIS — Z9889 Other specified postprocedural states: Secondary | ICD-10-CM

## 2018-08-02 MED ORDER — OXYCODONE-ACETAMINOPHEN 5-325 MG PO TABS: 1.0000 | ORAL_TABLET | Freq: Four times a day (QID) | ORAL | 0 refills | Status: DC | PRN

## 2018-08-02 NOTE — Progress Notes (Signed)
Post-Op Visit Note   Patient: Stacy [name]           Date of Birth: 04-06-1960           MRN: [id number] Visit Date: 08/02/2018 PCP: Marcine MatarJohnson, Deborah B, MD   Assessment & Plan:  Chief Complaint:  Chief Complaint  Patient presents with  . Lower Back - Routine Post Op  58 year old black female who is about 2 weeks out from L3-4 left microdiscectomy returns.  She is doing well.  Left leg pain is improving.  Ambulating with walker.  She continues to work with physical therapy.  Patient is requesting a hospital mattress.    Visit Diagnoses:  1. S/P lumbar microdiscectomy     Plan: Patient will continue to work with physical therapy.  She states that they have been having her do some bilateral hip flexion exercises when she is in the seated position and these have been aggravating her back.  I advised her to discontinue these exercises.  She requested a prescription for a hospital mattress and this was given.  She has a hospital bed from several years ago.  [facility] if this is covered under her insurance.  Also refilled Percocet.  Follow-up in 4 weeks for recheck.  Follow-Up Instructions: Return in about 4 weeks (around 08/30/2018).   Orders:  No orders of the defined types were placed in this encounter.  Meds ordered this encounter  Medications  . oxyCODONE-acetaminophen (PERCOCET/ROXICET) 5-325 MG tablet    Sig: Take 1 tablet by mouth every 6 (six) hours as needed for severe pain.    Dispense:  40 tablet    Refill:  0    Imaging: No results found.  PMFS History: Patient Active Problem List   Diagnosis Date Noted  . Status post lumbar laminectomy 07/15/2018  . Moderate persistent asthma without complication 06/29/2017  . Environmental and seasonal allergies 06/29/2017  . Controlled type 2 diabetes mellitus with diabetic polyneuropathy, without long-term current use of insulin (HCC) 06/29/2017  . Perennial allergic  rhinitis 04/08/2017  . Sensorineural hearing loss (SNHL), bilateral 04/08/2017  . Chronic pansinusitis 03/25/2017  . Eustachian tube dysfunction, bilateral 03/25/2017  . 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 Herniation of lumbar intervertebral disc with radiculopathy 10/02/2016    Class: Chronic  . Alopecia areata 08/19/2016  . Chondromalacia of both patellae 06/03/2015    Class: Chronic  . Spinal stenosis, lumbar region, with neurogenic claudication 06/03/2015  . Tobacco use disorder 04/25/2015  . DJD (degenerative joint disease) of knee 01/04/2015  . Hemorrhoid 11/14/2014  . Gout of big toe 07/19/2014  . Essential hypertension 08/14/2013  . Gastroesophageal reflux disease without esophagitis 08/14/2013  . COPD (chronic obstructive pulmonary disease) (HCC) 04/17/2011   Past Medical History:  Diagnosis Date  . Arthritis   . 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 Asthma   . COPD (chronic obstructive pulmonary disease) (HCC)   . Diabetes mellitus    Type II  . GERD (gastroesophageal reflux disease)    Pt on Protonix daily  . Glaucoma   . Gout   . Headache(784.0)    otc meds prn  . Hyperlipidemia   . Hypertension   . Irritable bowel syndrome 11/19/2010  . Neuropathy   . Pneumonia   . Restless legs   . Shortness of breath    07/14/2018- uses  4 times a day    Family History  Problem  Relation Age of Onset  . Hypertension Father   . Cancer Father   . Heart disease Mother   . Asthma Son        had as a child  . Heart disease Sister   . Breast cancer Sister   . Hypertension Brother     Past Surgical History:  Procedure Laterality Date  . CHOLECYSTECTOMY    . COLONOSCOPY    . ENDOMETRIAL ABLATION  10/2010  . HERNIA REPAIR     umbicial hernia  . KNEE ARTHROSCOPY Left    06/07/2017 Dr. Marlou Sa of [facility]  . KNEE CLOSED REDUCTION Right 12/06/2015   Procedure: CLOSED MANIPULATION RIGHT KNEE;  Surgeon: [name], MD;  Location: [place];  Service:  Orthopedics;  Laterality: Right;  . KNEE CLOSED REDUCTION Right 01/17/2016   Procedure: CLOSED MANIPULATION RIGHT KNEE;  Surgeon: [name], MD;  Location: [place];  Service: Orthopedics;  Laterality: Right;  . KNEE JOINT MANIPULATION Right 12/06/2015  . LUMBAR DISC SURGERY  06/03/2015   L 2  L3 L4 L5   . LUMBAR LAMINECTOMY/DECOMPRESSION MICRODISCECTOMY N/A 06/03/2015   Procedure: Bilateral lateral recess decompression L2-3, L3-4, L4-5;  Surgeon: [name], MD;  Location: [place];  Service: Orthopedics;  Laterality: N/A;  . LUMBAR LAMINECTOMY/DECOMPRESSION MICRODISCECTOMY N/A 10/02/2016   Procedure: Right L5-S1 Lateral Recess Decompression  microdiscectomy;  Surgeon: [name], MD;  Location: [place];  Service: Orthopedics;  Laterality: N/A;  . LUMBAR LAMINECTOMY/DECOMPRESSION MICRODISCECTOMY N/A 07/15/2018   Procedure: LEFT L3-4 MICRODISCECTOMY;  Surgeon: [name], MD;  Location: [place];  Service: Orthopedics;  Laterality: N/A;  . svd      x 2  . TOTAL KNEE ARTHROPLASTY Right 09/06/2015   Procedure: RIGHT TOTAL KNEE ARTHROPLASTY;  Surgeon: [name], MD;  Location: [place];  Service: Orthopedics;  Laterality: Right;  . TUBAL LIGATION    . UPPER GASTROINTESTINAL ENDOSCOPY  04/28/11   Social History   Occupational History  . Occupation: unemployed    Fish farm manager: UNEMPLOYED  Tobacco Use  . Smoking status: Current Some Day Smoker    Packs/day: 0.25    Years: 32.00    Pack years: 8.00    Types: Cigarettes  . Smokeless tobacco: Never Used  Substance and Sexual Activity  . Alcohol use: No  . Drug use: No  . Sexual activity: Yes    Birth control/protection: Surgical   Exam Pleasant female alert and oriented in no acute distress.  Ambulating well with walker.  Wound looks good.  No drainage or signs of infection.  Staples removed and Steri-Strips applied.

## 2018-08-03 DIAGNOSIS — K219 Gastro-esophageal reflux disease without esophagitis: Secondary | ICD-10-CM | POA: Diagnosis not present

## 2018-08-03 DIAGNOSIS — Z7902 Long term (current) use of antithrombotics/antiplatelets: Secondary | ICD-10-CM | POA: Diagnosis not present

## 2018-08-03 DIAGNOSIS — E1140 Type 2 diabetes mellitus with diabetic neuropathy, unspecified: Secondary | ICD-10-CM | POA: Diagnosis not present

## 2018-08-03 DIAGNOSIS — H409 Unspecified glaucoma: Secondary | ICD-10-CM | POA: Diagnosis not present

## 2018-08-03 DIAGNOSIS — I10 Essential (primary) hypertension: Secondary | ICD-10-CM | POA: Diagnosis not present

## 2018-08-03 DIAGNOSIS — J449 Chronic obstructive pulmonary disease, unspecified: Secondary | ICD-10-CM | POA: Diagnosis not present

## 2018-08-03 DIAGNOSIS — Z7984 Long term (current) use of oral hypoglycemic drugs: Secondary | ICD-10-CM | POA: Diagnosis not present

## 2018-08-03 DIAGNOSIS — M5116 Intervertebral disc disorders with radiculopathy, lumbar region: Secondary | ICD-10-CM | POA: Diagnosis not present

## 2018-08-03 DIAGNOSIS — Z4789 Encounter for other orthopedic aftercare: Secondary | ICD-10-CM | POA: Diagnosis not present

## 2018-08-04 DIAGNOSIS — F411 Generalized anxiety disorder: Secondary | ICD-10-CM | POA: Diagnosis not present

## 2018-08-08 DIAGNOSIS — J3081 Allergic rhinitis due to animal (cat) (dog) hair and dander: Secondary | ICD-10-CM | POA: Diagnosis not present

## 2018-08-08 DIAGNOSIS — M5116 Intervertebral disc disorders with radiculopathy, lumbar region: Secondary | ICD-10-CM | POA: Diagnosis not present

## 2018-08-08 DIAGNOSIS — Z7984 Long term (current) use of oral hypoglycemic drugs: Secondary | ICD-10-CM | POA: Diagnosis not present

## 2018-08-08 DIAGNOSIS — H409 Unspecified glaucoma: Secondary | ICD-10-CM | POA: Diagnosis not present

## 2018-08-08 DIAGNOSIS — I10 Essential (primary) hypertension: Secondary | ICD-10-CM | POA: Diagnosis not present

## 2018-08-08 DIAGNOSIS — E1140 Type 2 diabetes mellitus with diabetic neuropathy, unspecified: Secondary | ICD-10-CM | POA: Diagnosis not present

## 2018-08-08 DIAGNOSIS — J449 Chronic obstructive pulmonary disease, unspecified: Secondary | ICD-10-CM | POA: Diagnosis not present

## 2018-08-08 DIAGNOSIS — J301 Allergic rhinitis due to pollen: Secondary | ICD-10-CM | POA: Diagnosis not present

## 2018-08-08 DIAGNOSIS — J3089 Other allergic rhinitis: Secondary | ICD-10-CM | POA: Diagnosis not present

## 2018-08-08 DIAGNOSIS — K219 Gastro-esophageal reflux disease without esophagitis: Secondary | ICD-10-CM | POA: Diagnosis not present

## 2018-08-08 DIAGNOSIS — Z4789 Encounter for other orthopedic aftercare: Secondary | ICD-10-CM | POA: Diagnosis not present

## 2018-08-08 DIAGNOSIS — Z7902 Long term (current) use of antithrombotics/antiplatelets: Secondary | ICD-10-CM | POA: Diagnosis not present

## 2018-08-08 NOTE — Progress Notes (Signed)
Subjective:    Patient ID: Stacy [name], female    DOB: 04/11/1960, 58 y.o.   MRN: [id number]  57 y.o.F with chronic asthma and chronic obstructive lung disease.  Thus the patient has asthma COPD overlap syndrome  This patient has a longstanding history of COPD dating back to 2013.  Patient prior to this had significant moderate persistent asthma as well.  The patient is still smoking 3 cigarettes a day but smoked more than this in the past.  Since the last visit in May the patient's dyspnea is slightly improved.  Mucus is much easier to raise using the flutter valve she obtained at the last visit.  Her HFA technique was reviewed again and was improved.  The patient is maintaining Symbicort and Spiriva daily.  Smoking is down to 3 cigarettes/week Please review shortness of breath assessment below  The patient did have low back surgery and previous visits with orthopedics.   Shortness of Breath This is a chronic problem. The current episode started more than 1 year ago. The problem occurs daily (walking or at night will wheeze). The problem has been unchanged. Associated symptoms include orthopnea, PND, rhinorrhea, sputum production and wheezing. Pertinent negatives include no chest pain, fever, headaches, hemoptysis, leg swelling, sore throat or syncope. The symptoms are aggravated by any activity, exercise, lying flat, URIs and weather changes. Associated symptoms comments: Coughs up some brown mucus at night. Risk factors include smoking. She has tried beta agonist inhalers, ipratropium inhalers and steroid inhalers for the symptoms. The treatment provided moderate relief. Her past medical history is significant for allergies, aspirin allergies, asthma, COPD and pneumonia. There is no history of CAD, DVT, a heart failure or PE.   Past Medical History:  Diagnosis Date  . Arthritis   . 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 Asthma   . COPD (chronic obstructive  pulmonary disease) ([place])   . Diabetes mellitus    Type II  . GERD (gastroesophageal reflux disease)    Pt on Protonix daily  . Glaucoma   . Gout   . Headache(784.0)    otc meds prn  . Hyperlipidemia   . Hypertension   . Irritable bowel syndrome 11/19/2010  . Neuropathy   . Pneumonia   . Restless legs   . Shortness of breath    07/14/2018- uses  4 times a day     Family History  Problem Relation Age of Onset  . Hypertension Father   . Cancer Father   . Heart disease Mother   . Asthma Son        had as a child  . Heart disease Sister   . Breast cancer Sister   . Hypertension Brother      Social History   Socioeconomic History  . Marital status: Married    Spouse name: Not on file  . Number of children: 2  . Years of education: Not on file  . Highest education level: Not on file  Occupational History  . Occupation: unemployed    Fish farm manager: UNEMPLOYED  Social Needs  . Financial resource strain: Not on file  . Food insecurity    Worry: Not on file    Inability: Not on file  . Transportation needs    Medical: Not on file    Non-medical: Not on file  Tobacco Use  . Smoking status: Current Some Day Smoker    Packs/day: 0.25    Years: 32.00  Pack years: 8.00    Types: Cigarettes  . Smokeless tobacco: Never Used  Substance and Sexual Activity  . Alcohol use: No  . Drug use: No  . Sexual activity: Yes    Birth control/protection: Surgical  Lifestyle  . Physical activity    Days per week: Not on file    Minutes per session: Not on file  . Stress: Not on file  Relationships  . Social Herbalist on phone: Not on file    Gets together: Not on file    Attends religious service: Not on file    Active member of club or organization: Not on file    Attends meetings of clubs or organizations: Not on file    Relationship status: Not on file  . Intimate partner violence    Fear of current or ex partner: Not on file    Emotionally abused: Not on file     Physically abused: Not on file    Forced sexual activity: Not on file  Other Topics Concern  . Not on file  Social History Narrative  . Not on file     Allergies  Allergen Reactions  . Other Shortness Of Breath    UNSPECIFIED AGENTS Allergic to perfumes and cleaning products  . Ace Inhibitors Cough and Other (See Comments)       . Shellfish Allergy Other (See Comments)    Per allergy test.  . Aspirin Nausea Only     Outpatient Medications Prior to Visit  Medication Sig Dispense Refill  . ACCU-CHEK SOFTCLIX LANCETS lancets USE AS INSTRUCTED 3 TIMES A DAY 100 each 12  . albuterol (PROVENTIL) (2.5 MG/3ML) 0.083% nebulizer solution Take 3 mLs (2.5 mg total) by nebulization every 6 (six) hours as needed for wheezing. 100 vial 6  . allopurinol (ZYLOPRIM) 100 MG tablet TAKE 1 TABLET (100 MG TOTAL) BY MOUTH DAILY. 90 tablet 3  . Blood Glucose Monitoring Suppl (ACCU-CHEK AVIVA PLUS) w/Device KIT 1 each by Does not apply route 3 (three) times daily. 1 kit 0  . budesonide-formoterol (SYMBICORT) 80-4.5 MCG/ACT inhaler INHALE TWO PUFFS BY MOUTH TWICE A DAY (Patient taking differently: Inhale 2 puffs into the lungs 2 (two) times a day. ) 1 Inhaler 11  . celecoxib (CELEBREX) 200 MG capsule Take 1 capsule (200 mg total) by mouth 2 (two) times daily. 180 capsule 3  . clopidogrel (PLAVIX) 75 MG tablet Take 1 tablet (75 mg total) by mouth daily. 30 tablet 3  . cromolyn (OPTICROM) 4 % ophthalmic solution Place 1 drop into both eyes 4 (four) times daily.     . diclofenac sodium (VOLTAREN) 1 % GEL Apply 4 g topically 4 (four) times daily. 5 Tube 6  . docusate sodium (COLACE) 100 MG capsule Take 1 capsule (100 mg total) by mouth 2 (two) times daily. 10 capsule 0  . EPINEPHrine 0.3 mg/0.3 mL IJ SOAJ injection 0.3 mg IM x 1 PRN for allergic reaction (Patient taking differently: Inject 0.3 mg into the muscle as needed for anaphylaxis. ) 1 Device 1  . fluticasone (FLONASE) 50 MCG/ACT nasal spray Place 1  spray into both nostrils daily. 16 g 6  . gabapentin (NEURONTIN) 300 MG capsule Take 1 capsule (300 mg total) by mouth 3 (three) times daily. 270 capsule 3  . glimepiride (AMARYL) 2 MG tablet Take 1 tablet (2 mg total) by mouth daily. 30 tablet 6  . hydrochlorothiazide (HYDRODIURIL) 25 MG tablet Take 1 tablet (25 mg total) by  mouth daily. 90 tablet 3  . hydrOXYzine (ATARAX/VISTARIL) 10 MG tablet TAKE 1 TABLET IN THE MORNING AND NOON AND 2 TABLETS IN THE EVENING AS NEEDED (Patient taking differently: Take 10-20 mg by mouth See admin instructions. TAKE 1 TABLET IN THE MORNING AND NOON AND 2 TABLETS IN THE EVENING AS NEEDED for anxiety or itching) 120 tablet 0  . Lancets (ACCU-CHEK SOFT TOUCH) lancets Use as instructed 100 each 12  . loratadine (CLARITIN) 10 MG tablet Take 1 tablet (10 mg total) by mouth daily. 30 tablet 11  . Melatonin 3 MG TABS Take 1 tablet (3 mg total) by mouth at bedtime as needed. (Patient taking differently: Take 3 mg by mouth at bedtime as needed (sleep). ) 30 tablet 3  . methocarbamol (ROBAXIN) 500 MG tablet Take 1 tablet (500 mg total) by mouth every 8 (eight) hours as needed for muscle spasms. 60 tablet 3  . methocarbamol (ROBAXIN) 500 MG tablet Take 1 tablet (500 mg total) by mouth every 8 (eight) hours as needed for muscle spasms. 40 tablet 1  . methocarbamol (ROBAXIN) 500 MG tablet 1 to 2 tablets by mouth every 8 hrs as needed. 40 tablet 0  . MITIGARE 0.6 MG CAPS Take 1 capsule by mouth daily. 30 capsule 3  . montelukast (SINGULAIR) 10 MG tablet Take 10 mg by mouth at bedtime.     . Multiple Vitamin (MULTIVITAMIN WITH MINERALS) TABS tablet Take 1 tablet by mouth daily.    . nicotine (NICODERM CQ - DOSED IN MG/24 HOURS) 14 mg/24hr patch PLACE 1 PATCH ONTO THE SKIN DAILY (Patient taking differently: Place 14 mg onto the skin daily. ) 30 patch 2  . oxyCODONE-acetaminophen (PERCOCET/ROXICET) 5-325 MG tablet Take 1 tablet by mouth every 6 (six) hours as needed for severe pain.  40 tablet 0  . pantoprazole (PROTONIX) 40 MG tablet Take 1 tablet (40 mg total) by mouth daily. 30 tablet 6  . Potassium Chloride ER 20 MEQ TBCR Take 20 mEq by mouth daily. 30 tablet 6  . Respiratory Therapy Supplies (FLUTTER) DEVI Use after breathing treatment 4 times daily 1 each 0  . simvastatin (ZOCOR) 20 MG tablet Take 20 mg by mouth at bedtime.    . sucralfate (CARAFATE) 1 g tablet Take 1 g by mouth 4 (four) times daily -  with meals and at bedtime.    Marland Kitchen tiotropium (SPIRIVA HANDIHALER) 18 MCG inhalation capsule Place 1 capsule (18 mcg total) into inhaler and inhale daily. 30 capsule 12  . valsartan-hydrochlorothiazide (DIOVAN-HCT) 160-12.5 MG tablet Take 1 tablet by mouth daily. Must keep upcoming appt for refills. (Patient taking differently: Take 1 tablet by mouth daily. ) 30 tablet 6  . ACCU-CHEK AVIVA PLUS test strip USE 3 TIMES A DAY AS DIRECTED BY PHYSICIAN 100 each 12  . potassium chloride SA (K-DUR) 20 MEQ tablet Take 20 mEq by mouth daily.     No facility-administered medications prior to visit.      Review of Systems  Constitutional: Negative for fever.  HENT: Positive for rhinorrhea. Negative for sore throat.   Respiratory: Positive for sputum production, shortness of breath and wheezing. Negative for hemoptysis.   Cardiovascular: Positive for orthopnea and PND. Negative for chest pain, leg swelling and syncope.  Neurological: Negative for headaches.       Objective:   Physical Exam Vitals:   08/09/18 0859  BP: 113/72  Pulse: 86  Temp: 98.5 F (36.9 C)  TempSrc: Oral  SpO2: 97%  Weight: 182  lb 3.2 oz (82.6 kg)  Height: 5' 6"  (1.676 m)    Gen: Pleasant, well-nourished, in no distress,  normal affect  ENT: No lesions,  mouth clear,  oropharynx clear, no postnasal drip  Neck: No JVD, no TMG, no carotid bruits  Lungs: No use of accessory muscles, no dullness to percussion, distant breath sounds however no wheezes are auscultated   cardiovascular: RRR, heart  sounds normal, no murmur or gallops, no peripheral edema  Abdomen: soft and NT, no HSM,  BS normal  Musculoskeletal: No deformities, no cyanosis or clubbing  Neuro: alert, non focal  Skin: Warm, no lesions or rashes  CBG 08/09/2018: 174  PFTs 05/25/2011 FEV1  2.75 (103%) ratio 73 and DLC0 58 corrects to 67% c/w GOLD 0  07/2017:  CXR: NAD.        Assessment & Plan:  I personally reviewed all images and lab data in the [facility] system as well as any outside material available during this office visit and agree with the  radiology impressions.   COPD (chronic obstructive pulmonary disease) Chronic obstructive lung disease with asthmatic component previously labeled as gold stage 0 in 2013  The patient is currently on Spiriva and Symbicort and tolerating well  Plan will be for the patient to continue both inhalers as prescribed    Moderate persistent asthma without complication Moderate persistent asthma stable at this time  Continue Symbicort and Spiriva as prescribed  Perennial allergic rhinitis Perennial allergic rhinitis stable on current program of loratadine and Flonase  Gastroesophageal reflux disease without esophagitis Gastroesophageal reflux disease for now we will continue Protonix daily  Tobacco use disorder Tobacco use disorder  The patient is down to 3 cigarettes a week will continue with smoking cessation counseling today as well as ongoing nicotine patch use   Ayako was seen today for copd.  Diagnoses and all orders for this visit:  Moderate persistent asthma without complication  Controlled type 2 diabetes mellitus with diabetic polyneuropathy, without long-term current use of insulin (HCC) -     Glucose (CBG)  Type 2 diabetes mellitus without complication, without long-term current use of insulin (HCC) -     glucose blood (ACCU-CHEK AVIVA PLUS) test strip; USE 3 TIMES A DAY AS DIRECTED BY PHYSICIAN  Mixed simple and mucopurulent chronic bronchitis  (HCC)  Tobacco use disorder  Perennial allergic rhinitis  Gastroesophageal reflux disease without esophagitis

## 2018-08-09 ENCOUNTER — Telehealth: Payer: Self-pay | Admitting: Specialist

## 2018-08-09 ENCOUNTER — Ambulatory Visit: Payer: Medicaid Other | Attending: Critical Care Medicine | Admitting: Critical Care Medicine

## 2018-08-09 ENCOUNTER — Other Ambulatory Visit: Payer: Self-pay

## 2018-08-09 ENCOUNTER — Encounter: Payer: Self-pay | Admitting: Critical Care Medicine

## 2018-08-09 VITALS — BP 113/72 | HR 86 | Temp 98.5°F | Ht 66.0 in | Wt 182.2 lb

## 2018-08-09 DIAGNOSIS — Z7951 Long term (current) use of inhaled steroids: Secondary | ICD-10-CM | POA: Diagnosis not present

## 2018-08-09 DIAGNOSIS — Z7902 Long term (current) use of antithrombotics/antiplatelets: Secondary | ICD-10-CM | POA: Diagnosis not present

## 2018-08-09 DIAGNOSIS — J4540 Moderate persistent asthma, uncomplicated: Secondary | ICD-10-CM | POA: Insufficient documentation

## 2018-08-09 DIAGNOSIS — G2581 Restless legs syndrome: Secondary | ICD-10-CM | POA: Diagnosis not present

## 2018-08-09 DIAGNOSIS — Z886 Allergy status to analgesic agent status: Secondary | ICD-10-CM | POA: Diagnosis not present

## 2018-08-09 DIAGNOSIS — I10 Essential (primary) hypertension: Secondary | ICD-10-CM | POA: Insufficient documentation

## 2018-08-09 DIAGNOSIS — Z825 Family history of asthma and other chronic lower respiratory diseases: Secondary | ICD-10-CM | POA: Diagnosis not present

## 2018-08-09 DIAGNOSIS — F17210 Nicotine dependence, cigarettes, uncomplicated: Secondary | ICD-10-CM | POA: Insufficient documentation

## 2018-08-09 DIAGNOSIS — F17200 Nicotine dependence, unspecified, uncomplicated: Secondary | ICD-10-CM

## 2018-08-09 DIAGNOSIS — J449 Chronic obstructive pulmonary disease, unspecified: Secondary | ICD-10-CM | POA: Diagnosis not present

## 2018-08-09 DIAGNOSIS — E1142 Type 2 diabetes mellitus with diabetic polyneuropathy: Secondary | ICD-10-CM | POA: Insufficient documentation

## 2018-08-09 DIAGNOSIS — J3089 Other allergic rhinitis: Secondary | ICD-10-CM

## 2018-08-09 DIAGNOSIS — M109 Gout, unspecified: Secondary | ICD-10-CM | POA: Insufficient documentation

## 2018-08-09 DIAGNOSIS — Z803 Family history of malignant neoplasm of breast: Secondary | ICD-10-CM | POA: Diagnosis not present

## 2018-08-09 DIAGNOSIS — K219 Gastro-esophageal reflux disease without esophagitis: Secondary | ICD-10-CM | POA: Diagnosis not present

## 2018-08-09 DIAGNOSIS — Z79899 Other long term (current) drug therapy: Secondary | ICD-10-CM | POA: Insufficient documentation

## 2018-08-09 DIAGNOSIS — Z888 Allergy status to other drugs, medicaments and biological substances status: Secondary | ICD-10-CM | POA: Insufficient documentation

## 2018-08-09 DIAGNOSIS — Z7984 Long term (current) use of oral hypoglycemic drugs: Secondary | ICD-10-CM | POA: Insufficient documentation

## 2018-08-09 DIAGNOSIS — E785 Hyperlipidemia, unspecified: Secondary | ICD-10-CM | POA: Diagnosis not present

## 2018-08-09 DIAGNOSIS — Z8249 Family history of ischemic heart disease and other diseases of the circulatory system: Secondary | ICD-10-CM | POA: Diagnosis not present

## 2018-08-09 DIAGNOSIS — Z91013 Allergy to seafood: Secondary | ICD-10-CM | POA: Diagnosis not present

## 2018-08-09 DIAGNOSIS — J418 Mixed simple and mucopurulent chronic bronchitis: Secondary | ICD-10-CM

## 2018-08-09 DIAGNOSIS — E119 Type 2 diabetes mellitus without complications: Secondary | ICD-10-CM

## 2018-08-09 LAB — GLUCOSE, POCT (MANUAL RESULT ENTRY): POC Glucose: 174 mg/dl — AB (ref 70–99)

## 2018-08-09 MED ORDER — ACCU-CHEK AVIVA PLUS VI STRP: ORAL_STRIP | 12 refills | Status: DC

## 2018-08-09 NOTE — Patient Instructions (Signed)
No change in medications  Refills on your diabetic test strips were sent to your local pharmacy  Continue to work on smoking cessation  Return 4 months see Dr. Warner Mccreedy

## 2018-08-09 NOTE — Assessment & Plan Note (Signed)
Chronic obstructive lung disease with asthmatic component previously labeled as gold stage 0 in 2013  The patient is currently on Spiriva and Symbicort and tolerating well  Plan will be for the patient to continue both inhalers as prescribed

## 2018-08-09 NOTE — Telephone Encounter (Signed)
Patient states she dropped off her Rx for Hydrocodone to [facility] on  [address] on 08/02/18 & they states that they are still waiting authorization from Benjiman Core or Dr [name] to be able to fill Rx. Pt states she has been out of medication and needs it ASAP.

## 2018-08-09 NOTE — Assessment & Plan Note (Signed)
Moderate persistent asthma stable at this time  Continue Symbicort and Spiriva as prescribed

## 2018-08-09 NOTE — Telephone Encounter (Signed)
I redone the prior auth and per the pharm it went thru.

## 2018-08-09 NOTE — Assessment & Plan Note (Signed)
Gastroesophageal reflux disease for now we will continue Protonix daily

## 2018-08-09 NOTE — Assessment & Plan Note (Signed)
Tobacco use disorder  The patient is down to 3 cigarettes a week will continue with smoking cessation counseling today as well as ongoing nicotine patch use

## 2018-08-09 NOTE — Assessment & Plan Note (Signed)
Perennial allergic rhinitis stable on current program of loratadine and Flonase

## 2018-08-09 NOTE — Progress Notes (Signed)
  CBG fasting today was 174  COPD follow up today

## 2018-08-10 ENCOUNTER — Other Ambulatory Visit: Payer: Self-pay | Admitting: Internal Medicine

## 2018-08-10 DIAGNOSIS — I739 Peripheral vascular disease, unspecified: Secondary | ICD-10-CM

## 2018-08-11 ENCOUNTER — Encounter: Payer: Self-pay | Admitting: Physical Medicine & Rehabilitation

## 2018-08-11 ENCOUNTER — Other Ambulatory Visit: Payer: Self-pay

## 2018-08-11 ENCOUNTER — Encounter: Payer: Medicaid Other | Attending: Physical Medicine & Rehabilitation | Admitting: Physical Medicine & Rehabilitation

## 2018-08-11 VITALS — BP 108/65 | HR 75 | Temp 97.9°F | Ht 66.0 in | Wt 182.6 lb

## 2018-08-11 DIAGNOSIS — M25561 Pain in right knee: Secondary | ICD-10-CM | POA: Insufficient documentation

## 2018-08-11 DIAGNOSIS — G8929 Other chronic pain: Secondary | ICD-10-CM | POA: Insufficient documentation

## 2018-08-11 DIAGNOSIS — Z96651 Presence of right artificial knee joint: Secondary | ICD-10-CM | POA: Insufficient documentation

## 2018-08-11 DIAGNOSIS — F411 Generalized anxiety disorder: Secondary | ICD-10-CM | POA: Diagnosis not present

## 2018-08-11 DIAGNOSIS — R269 Unspecified abnormalities of gait and mobility: Secondary | ICD-10-CM | POA: Diagnosis not present

## 2018-08-11 NOTE — Progress Notes (Signed)
Subjective:    Patient ID: Stacy [name], female    DOB: Jun 16, 1960, 58 y.o.   MRN: [id number]  HPI Female with pmh/psh RLS, neuropathy, HTN, headache, GERD, DM, COPD, lumbar spinal stenosis with claudication s/p lumbar surgery x3 (most recent 07/15/2018), DJD, PAD right TKA in 2017 followed up by manipulation x2 presents with right knee pain.  History taken from chart review and patient. Started ~2018.  States she had a knee pain with buckling.  Denies inciting event.  Getting improve.  Ice/heat improves the pain.  Prolonged standing exacerbates the pain.  Non-radiating. Sharp.  Intermittent.  Denies associated numbness.  Denies falls. Pain limits prolonged standing and ambulation.   Pain Inventory Average Pain 7 Pain Right Now 4 My pain is intermittent, sharp, stabbing and aching  In the last 24 hours, has pain interfered with the following? General activity 0 Relation with others 0 Enjoyment of life 0 What TIME of day is your pain at its worst? all Sleep (in general) Fair  Pain is worse with: walking, bending, sitting and standing Pain improves with: medication Relief from Meds: 1  Mobility use a walker how many minutes can you walk? 5 ability to climb steps?  no do you drive?  no  Function not employed: date last employed n/a disabled: date disabled 2016  Neuro/Psych trouble walking spasms  Prior Studies Any changes since last visit?  no CT/MRI  Physicians involved in your care Orthopedist Alexis GoodellNitka Jamesie, MD   Family History  Problem Relation Age of Onset   Hypertension Father    Cancer Father    Heart disease Mother    Asthma Son        had as a child   Heart disease Sister    Breast cancer Sister    Hypertension Brother    Social History   Socioeconomic History   Marital status: Married    Spouse name: Not on file   Number of children: 2   Years of education: Not on file   Highest education level: Not on file  Occupational  History   Occupation: unemployed    Associate Professormployer: UNEMPLOYED  Social Network [employer]   Financial resource strain: Not on file   Food insecurity    Worry: Not on file    Inability: Not on file   Transportation needs    Medical: Not on file    Non-medical: Not on file  Tobacco Use   Smoking status: Current Some Day Smoker    Packs/day: 0.25    Years: 32.00    Pack years: 8.00    Types: Cigarettes   Smokeless tobacco: Never Used  Substance and Sexual Activity   Alcohol use: No   Drug use: No   Sexual activity: Yes    Birth control/protection: Surgical  Lifestyle   Physical activity    Days per week: Not on file    Minutes per session: Not on file   Stress: Not on file  Relationships   Social connections    Talks on phone: Not on file    Gets together: Not on file    Attends religious service: Not on file    Active member of club or organization: Not on file    Attends meetings of clubs or organizations: Not on file    Relationship status: Not on file  Other Topics Concern   Not on file  Social History Narrative   Not on file   Past Surgical History:  Procedure Laterality Date  CHOLECYSTECTOMY     COLONOSCOPY     ENDOMETRIAL ABLATION  10/2010   HERNIA REPAIR     umbicial hernia   KNEE ARTHROSCOPY Left    06/07/2017 Dr[facility]   KNEE CLOSED REDUCTION Right 12/06/2015   Procedure: CLOSED MANIPULATION RIGHT KNEE;  Surgeon: Kerrin ChampagneJames E Nitka, MD;  [facility];  Service: Orthopedics;  Laterality: Right;   KNEE CLOSED REDUCTION Right 01/17/2016   Procedure: CLOSED MANIPULATION RIGHT KNEE;  Surgeon: Kerrin ChampagneJames E Nitka, MD;  Location: [facility] OR;  Service: Orthopedics;  Laterality: Right;   KNEE JOINT MANIPULATION Right 12/06/2015   LUMBAR DISC SURGERY  06/03/2015   L 2  L3 L4 L5    LUMBAR LAMINECTOMY/DECOMPRESSION MICRODISCECTOMY N/A 06/03/2015   Procedure: Bilateral lateral recess decompression L2-3, L3-4, L4-5;  Surgeon: Kerrin ChampagneJames E Nitka, MD;  Location: [facility] OR;   Service: Orthopedics;  Laterality: N/A;   LUMBAR LAMINECTOMY/DECOMPRESSION MICRODISCECTOMY N/A 10/02/2016   Procedure: Right L5-S1 Lateral Recess Decompression  microdiscectomy;  Surgeon: Kerrin ChampagneNitka, James E, MD;  [facility];  Service: Orthopedics;  Laterality: N/A;   LUMBAR LAMINECTOMY/DECOMPRESSION MICRODISCECTOMY N/A 07/15/2018   Procedure: LEFT L3-4 MICRODISCECTOMY;  Surgeon: Kerrin ChampagneNitka, James E, MD;  [facility];  Service: Orthopedics;  Laterality: N/A;   svd      x 2   TOTAL KNEE ARTHROPLASTY Right 09/06/2015   Procedure: RIGHT TOTAL KNEE ARTHROPLASTY;  Surgeon: Kerrin ChampagneJames E Nitka, MD;  Location: [facility] OR;  Service: Orthopedics;  Laterality: Right;   TUBAL LIGATION     UPPER GASTROINTESTINAL ENDOSCOPY  04/28/11   Past Medical History:  Diagnosis Date   Allergy    Shellfish, cleaning products   Arthritis    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   Asthma    COPD (chronic obstructive pulmonary disease) (HCC)    Diabetes mellitus    Type II   GERD (gastroesophageal reflux disease)    Pt on Protonix daily   Glaucoma    Gout    Headache(784.0)    otc meds prn   Hyperlipidemia    Hypertension    Irritable bowel syndrome 11/19/2010   Neuropathy    Pneumonia    Restless legs    Shortness of breath    07/14/2018- uses  4 times a day   BP 108/65    Pulse 75    Temp 97.9 F (36.6 C)    Ht 5\' 6"  (1.676 m)    Wt 182 lb 9.6 oz (82.8 kg)    SpO2 92%    BMI 29.47 kg/m   Opioid Risk Score:   Fall Risk Score:  `1  Depression screen PHQ 2/9  [facility] 08/11/2018 08/09/2018 06/06/2018 03/30/2018 02/14/2018 12/06/2017 11/11/2017  Decreased Interest 0 0 0 0 0 0 0  Down, Depressed, Hopeless 0 0 0 0 0 0 0  PHQ - 2 Score 0 0 0 0 0 0 0  Altered sleeping - 0 0 - - 0 1  Tired, decreased energy - 0 0 - - 2 0  Change in appetite - 0 0 - - 1 1  Feeling bad or failure about yourself  - 0 0 - - 0 0  Trouble concentrating - 0 0 - - 0 0  Moving slowly or  fidgety/restless - 0 0 - - 0 0  Suicidal thoughts - 0 - - - 0 0  PHQ-9 Score - 0 0 - - 3 2  Difficult doing work/chores -  Not difficult at all - - - - -  Some recent data might be hidden    Review of Systems  Constitutional: Positive for diaphoresis.  HENT: Negative.   Eyes: Negative.   Respiratory: Negative.   Cardiovascular: Negative.   Gastrointestinal: Negative.   Endocrine: Negative.   Genitourinary: Negative.   Musculoskeletal: Positive for gait problem.  Skin: Negative.   Allergic/Immunologic: Negative.   Hematological: Negative.   Psychiatric/Behavioral: Negative.   All other systems reviewed and are negative.      Objective:   Physical Exam Gen: NAD. Vital signs reviewed HENT: Normocephalic, Atraumatic Eyes: EOMI. No discharge.  Cardio: No JVD. Pulm: Effort normal. Abd: Nondistended MSK:  Gait WNL.   No TTP b/l LE  No edema.  Neuro: CN II-XII grossly intact.    Sensation intact to light touch in all LE dermatomes  Strength  5/5 in all LE myotomes  Neg SLP b/l Skin: Warm and Dry. Intact    Assessment & Plan:  Female with pmh/psh RLS, neuropathy, HTN, headache, GERD, DM, COPD, lumbar spinal stenosis with claudication s/p lumbar surgery x3 (most recent 07/15/2018), DJD, PAD right TKA in 2017 followed up by manipulation x2 presents with right knee pain.  1. Chronic right knee pain s/p TKA with manipulation x2  MRI from 09/2017 showing postop changes  Labs reviewed  Referral information reviewed  PMAWARE reviewed  Cont Heat/Cold  Cont HH PT  Benefit with E-stim in past, will order to reduce pain, improve function  Will consider bracing if necessary  Encouraged trial OTC Lidoderm patch  Will consider Gabapentin  Will consider Cymbalta  Will consider Robaxin   Will consider Mobic  Patient states main goal is to be more active   2. Gait abnormality  Cont rollator  Cont therapies

## 2018-08-12 DIAGNOSIS — R269 Unspecified abnormalities of gait and mobility: Secondary | ICD-10-CM | POA: Diagnosis not present

## 2018-08-15 ENCOUNTER — Telehealth: Payer: Self-pay | Admitting: Specialist

## 2018-08-15 DIAGNOSIS — J301 Allergic rhinitis due to pollen: Secondary | ICD-10-CM | POA: Diagnosis not present

## 2018-08-15 DIAGNOSIS — J3089 Other allergic rhinitis: Secondary | ICD-10-CM | POA: Diagnosis not present

## 2018-08-15 DIAGNOSIS — J3081 Allergic rhinitis due to animal (cat) (dog) hair and dander: Secondary | ICD-10-CM | POA: Diagnosis not present

## 2018-08-15 NOTE — Telephone Encounter (Signed)
Patient needs an extended PT order for [place].

## 2018-08-15 NOTE — Telephone Encounter (Signed)
Patient needs an extended PT order for Stacy [name].  Please call them for Fax# [phone]

## 2018-08-17 ENCOUNTER — Other Ambulatory Visit: Payer: Self-pay

## 2018-08-17 DIAGNOSIS — J301 Allergic rhinitis due to pollen: Secondary | ICD-10-CM | POA: Diagnosis not present

## 2018-08-17 DIAGNOSIS — J3081 Allergic rhinitis due to animal (cat) (dog) hair and dander: Secondary | ICD-10-CM | POA: Diagnosis not present

## 2018-08-17 DIAGNOSIS — J3089 Other allergic rhinitis: Secondary | ICD-10-CM | POA: Diagnosis not present

## 2018-08-17 DIAGNOSIS — E119 Type 2 diabetes mellitus without complications: Secondary | ICD-10-CM

## 2018-08-18 DIAGNOSIS — F411 Generalized anxiety disorder: Secondary | ICD-10-CM | POA: Diagnosis not present

## 2018-08-19 ENCOUNTER — Ambulatory Visit: Payer: Medicaid Other | Attending: Internal Medicine | Admitting: Internal Medicine

## 2018-08-19 ENCOUNTER — Telehealth: Payer: Self-pay | Admitting: Internal Medicine

## 2018-08-19 ENCOUNTER — Telehealth: Payer: Self-pay | Admitting: Specialist

## 2018-08-19 ENCOUNTER — Other Ambulatory Visit: Payer: Self-pay

## 2018-08-19 ENCOUNTER — Encounter: Payer: Self-pay | Admitting: Internal Medicine

## 2018-08-19 DIAGNOSIS — Z791 Long term (current) use of non-steroidal anti-inflammatories (NSAID): Secondary | ICD-10-CM | POA: Diagnosis not present

## 2018-08-19 DIAGNOSIS — R110 Nausea: Secondary | ICD-10-CM | POA: Insufficient documentation

## 2018-08-19 DIAGNOSIS — I10 Essential (primary) hypertension: Secondary | ICD-10-CM

## 2018-08-19 DIAGNOSIS — Z720 Tobacco use: Secondary | ICD-10-CM | POA: Insufficient documentation

## 2018-08-19 DIAGNOSIS — Z7984 Long term (current) use of oral hypoglycemic drugs: Secondary | ICD-10-CM | POA: Insufficient documentation

## 2018-08-19 DIAGNOSIS — J438 Other emphysema: Secondary | ICD-10-CM | POA: Diagnosis not present

## 2018-08-19 DIAGNOSIS — Z79899 Other long term (current) drug therapy: Secondary | ICD-10-CM | POA: Diagnosis not present

## 2018-08-19 DIAGNOSIS — G2581 Restless legs syndrome: Secondary | ICD-10-CM | POA: Diagnosis not present

## 2018-08-19 DIAGNOSIS — E1142 Type 2 diabetes mellitus with diabetic polyneuropathy: Secondary | ICD-10-CM

## 2018-08-19 DIAGNOSIS — Z7951 Long term (current) use of inhaled steroids: Secondary | ICD-10-CM | POA: Diagnosis not present

## 2018-08-19 DIAGNOSIS — J439 Emphysema, unspecified: Secondary | ICD-10-CM

## 2018-08-19 DIAGNOSIS — F17200 Nicotine dependence, unspecified, uncomplicated: Secondary | ICD-10-CM

## 2018-08-19 DIAGNOSIS — M170 Bilateral primary osteoarthritis of knee: Secondary | ICD-10-CM | POA: Insufficient documentation

## 2018-08-19 DIAGNOSIS — Z9889 Other specified postprocedural states: Secondary | ICD-10-CM

## 2018-08-19 DIAGNOSIS — Z7902 Long term (current) use of antithrombotics/antiplatelets: Secondary | ICD-10-CM | POA: Diagnosis not present

## 2018-08-19 DIAGNOSIS — F17210 Nicotine dependence, cigarettes, uncomplicated: Secondary | ICD-10-CM | POA: Diagnosis not present

## 2018-08-19 DIAGNOSIS — E1151 Type 2 diabetes mellitus with diabetic peripheral angiopathy without gangrene: Secondary | ICD-10-CM | POA: Insufficient documentation

## 2018-08-19 DIAGNOSIS — M109 Gout, unspecified: Secondary | ICD-10-CM | POA: Insufficient documentation

## 2018-08-19 DIAGNOSIS — D649 Anemia, unspecified: Secondary | ICD-10-CM | POA: Insufficient documentation

## 2018-08-19 MED ORDER — PROMETHAZINE HCL 25 MG PO TABS: 25.0000 mg | ORAL_TABLET | Freq: Two times a day (BID) | ORAL | 0 refills | Status: DC | PRN

## 2018-08-19 NOTE — Progress Notes (Signed)
Pt blood sugar this morning was 115  Pt states she is still having trouble getting out of bed in the mornings  Pt states she had back surgery 4 weeks ago   Pt states she is still having the numbness and tingling in her left leg

## 2018-08-19 NOTE — Telephone Encounter (Signed)
Patient called and stated that she needs referral to [place] [facility] therapy on [facility].  Please call patient as she is requesting to speak with you.  [phone]

## 2018-08-19 NOTE — Telephone Encounter (Signed)
Attempted to call pt and schedule 2-3 month f/u

## 2018-08-19 NOTE — Progress Notes (Signed)
Virtual Visit via Telephone Note Due to current restrictions/limitations of in-office visits due to the COVID-19 pandemic, this scheduled clinical appointment was converted to a telehealth visit  I connected with Stacy [name] on 08/19/18 at 1:57 p.m by telephone and verified that I am speaking with the correct person using two identifiers. I am in my office.  The patient is at home.  Only the patient and myself participated in this encounter.  I discussed the limitations, risks, security and privacy concerns of performing an evaluation and management service by telephone and the availability of in person appointments. I also discussed with the patient that there may be a patient responsible charge related to this service. The patient expressed understanding and agreed to proceed.   History of Present Illness: Pt with hx of HTN, DM with neuropathy,tob dep,HL, PAD, spinal stenosis with neurogenic claudication (s/p laminectomy 07/2018), COPD, RLS, OA knees, gout, anemia, recurrent sinusitis, receiving allergy shots from Dr. Faith Rogue    Since last visit with me she had lumbar laminectomy 1 mth ago by Dr. [name].  Home P.T ended last wk. -still having a little numbness and tingling in LT leg.  Also still has some pain in lower back -On review of blood tests, she is noted to have mild postoperative anemia.  DIABETES TYPE 2 Last A1C:   Results for orders placed or performed in visit on 08/09/18  Glucose (CBG)  Result Value Ref Range   POC Glucose 174 (A) 70 - 99 mg/dl   BS this morning was 113 A1C 1 mth ago was 7.3 Med Adherence:  _0  Yes    _1  No Medication side effects:  _2  Yes    _3  No Home Monitoring?  _4  Yes     Home glucose results range:before BF 90-100, bedtime BS 100-130 Diet Adherence: _5  Yes - like nausea this wk    Exercise: plans for out-pt PT Hypoglycemic episodes?: _6  Yes    _7  No Numbness of the feet? _8  Yes    _9  No Retinopathy hx? _10  Yes    _11  No Last  eye exam:  Comments:   HTN: compliant with meds and salt restriction BP this a.m was 130/76 No HA/dizziness.  Swelling in knees at times  Tob dep:  Still smoking 2-3 cig a day. Smokes when stress Using the patches and finds them helpful  COPD with asthmatic component:  Doing okay on Spiriva and Symbicort.  Saw Dr. Joya Gaskins since last visit with me Outpatient Encounter Medications as of 08/19/2018  Medication Sig  . ACCU-CHEK SOFTCLIX LANCETS lancets USE AS INSTRUCTED 3 TIMES A DAY  . albuterol (PROVENTIL) (2.5 MG/3ML) 0.083% nebulizer solution Take 3 mLs (2.5 mg total) by nebulization every 6 (six) hours as needed for wheezing.  Marland Kitchen allopurinol (ZYLOPRIM) 100 MG tablet TAKE 1 TABLET (100 MG TOTAL) BY MOUTH DAILY.  Marland Kitchen Blood Glucose Monitoring Suppl (ACCU-CHEK AVIVA PLUS) w/Device KIT 1 each by Does not apply route 3 (three) times daily.  . budesonide-formoterol (SYMBICORT) 80-4.5 MCG/ACT inhaler INHALE TWO PUFFS BY MOUTH TWICE A DAY (Patient taking differently: Inhale 2 puffs into the lungs 2 (two) times a day. )  . celecoxib (CELEBREX) 200 MG capsule Take 1 capsule (200 mg total) by mouth 2 (two) times daily.  . clopidogrel (PLAVIX) 75 MG tablet TAKE 1 TABLET (75 MG TOTAL) BY MOUTH DAILY.  . cromolyn (OPTICROM) 4 % ophthalmic solution Place 1 drop into both eyes 4 (four) times daily.   . diclofenac sodium (VOLTAREN) 1 %  GEL Apply 4 g topically 4 (four) times daily.  Marland Kitchen docusate sodium (COLACE) 100 MG capsule Take 1 capsule (100 mg total) by mouth 2 (two) times daily.  Marland Kitchen EPINEPHrine 0.3 mg/0.3 mL IJ SOAJ injection 0.3 mg IM x 1 PRN for allergic reaction (Patient taking differently: Inject 0.3 mg into the muscle as needed for anaphylaxis. )  . fluticasone (FLONASE) 50 MCG/ACT nasal spray Place 1 spray into both nostrils daily.  Marland Kitchen gabapentin (NEURONTIN) 300 MG capsule Take 1 capsule (300 mg total) by mouth 3 (three) times daily.  Marland Kitchen glimepiride (AMARYL) 2 MG tablet Take 1 tablet (2 mg total) by mouth  daily.  Marland Kitchen glucose blood (ACCU-CHEK AVIVA PLUS) test strip USE 3 TIMES A DAY AS DIRECTED BY PHYSICIAN  . hydrochlorothiazide (HYDRODIURIL) 25 MG tablet Take 1 tablet (25 mg total) by mouth daily.  . hydrOXYzine (ATARAX/VISTARIL) 10 MG tablet TAKE 1 TABLET IN THE MORNING AND NOON AND 2 TABLETS IN THE EVENING AS NEEDED (Patient taking differently: Take 10-20 mg by mouth See admin instructions. TAKE 1 TABLET IN THE MORNING AND NOON AND 2 TABLETS IN THE EVENING AS NEEDED for anxiety or itching)  . Lancets (ACCU-CHEK SOFT TOUCH) lancets Use as instructed  . loratadine (CLARITIN) 10 MG tablet Take 1 tablet (10 mg total) by mouth daily.  . Melatonin 3 MG TABS Take 1 tablet (3 mg total) by mouth at bedtime as needed. (Patient taking differently: Take 3 mg by mouth at bedtime as needed (sleep). )  . methocarbamol (ROBAXIN) 500 MG tablet Take 1 tablet (500 mg total) by mouth every 8 (eight) hours as needed for muscle spasms.  . methocarbamol (ROBAXIN) 500 MG tablet Take 1 tablet (500 mg total) by mouth every 8 (eight) hours as needed for muscle spasms.  . methocarbamol (ROBAXIN) 500 MG tablet 1 to 2 tablets by mouth every 8 hrs as needed.  Marland Kitchen MITIGARE 0.6 MG CAPS Take 1 capsule by mouth daily.  . montelukast (SINGULAIR) 10 MG tablet Take 10 mg by mouth at bedtime.   . Multiple Vitamin (MULTIVITAMIN WITH MINERALS) TABS tablet Take 1 tablet by mouth daily.  . nicotine (NICODERM CQ - DOSED IN MG/24 HOURS) 14 mg/24hr patch PLACE 1 PATCH ONTO THE SKIN DAILY  . oxyCODONE-acetaminophen (PERCOCET/ROXICET) 5-325 MG tablet Take 1 tablet by mouth every 6 (six) hours as needed for severe pain. (Patient not taking: Reported on 08/11/2018)  . pantoprazole (PROTONIX) 40 MG tablet Take 1 tablet (40 mg total) by mouth daily.  . Potassium Chloride ER 20 MEQ TBCR Take 20 mEq by mouth daily.  . potassium chloride SA (K-DUR) 20 MEQ tablet Take 20 mEq by mouth daily.  Marland Kitchen Respiratory Therapy Supplies (FLUTTER) DEVI Use after breathing  treatment 4 times daily  . simvastatin (ZOCOR) 20 MG tablet Take 20 mg by mouth at bedtime.  . sucralfate (CARAFATE) 1 g tablet Take 1 g by mouth 4 (four) times daily -  with meals and at bedtime.  Marland Kitchen tiotropium (SPIRIVA HANDIHALER) 18 MCG inhalation capsule Place 1 capsule (18 mcg total) into inhaler and inhale daily.  . valsartan-hydrochlorothiazide (DIOVAN-HCT) 160-12.5 MG tablet Take 1 tablet by mouth daily. Must keep upcoming appt for refills. (Patient taking differently: Take 1 tablet by mouth daily. )  . [DISCONTINUED] Fluticasone-Salmeterol (ADVAIR) 500-50 MCG/DOSE AEPB Inhale 1 puff into the lungs every 12 (twelve) hours.    . [DISCONTINUED] lisinopril (PRINIVIL,ZESTRIL) 40 MG tablet Take 40 mg by mouth daily.     No facility-administered encounter  medications on file as of 08/19/2018.     Observations/Objective: Lab Results  Component Value Date   HGBA1C 7.3 (H) 07/15/2018   Lab Results  Component Value Date   WBC 8.8 07/16/2018   HGB 10.4 (L) 07/16/2018   HCT 31.2 (L) 07/16/2018   MCV 79.0 (L) 07/16/2018   PLT 264 07/16/2018     Chemistry      Component Value Date/Time   NA 136 07/16/2018 0415   NA 138 02/11/2018 0923   K 4.0 07/16/2018 0415   CL 100 07/16/2018 0415   CO2 24 07/16/2018 0415   BUN 10 07/16/2018 0415   BUN 12 02/11/2018 0923   CREATININE 0.82 07/16/2018 0415   CREATININE 0.90 03/11/2016 1556      Component Value Date/Time   CALCIUM 9.0 07/16/2018 0415   ALKPHOS 107 07/15/2018 1031   AST 22 07/15/2018 1031   ALT 17 07/15/2018 1031   BILITOT 0.4 07/15/2018 1031   BILITOT 0.2 11/11/2017 0950       Assessment and Plan: 1. Controlled type 2 diabetes mellitus with diabetic polyneuropathy, without long-term current use of insulin ([place]) Reported blood sugar readings are at goal. Encourage healthy eating habits. She will be doing some outpatient physical therapy to continue healing from recent laminectomy Continue Amaryl.  2. Essential  hypertension Continue current medications.  Reported blood pressure reading at home is good and at goal  3. Status post lumbar laminectomy Followed by Dr. [name].  She has an appointment coming up later this month  4. Tobacco use disorder Continue to encourage her toward smoking cessation.  Encouraged to use the nicotine patches consistently to decrease her cravings.  Less than 5-minute spent on counseling  5. Pulmonary emphysema, unspecified emphysema type ([facility]) Stable on current inhalers  6. Nausea - promethazine (PHENERGAN) 25 MG tablet; Take 1 tablet (25 mg total) by mouth 2 (two) times daily as needed for nausea or vomiting.  Dispense: 20 tablet; Refill: 0  7. Postoperative anemia Plan to recheck CBC on follow-up visit   Follow Up Instructions: 3-4 mths   I discussed the assessment and treatment plan with the patient. The patient was provided an opportunity to ask questions and all were answered. The patient agreed with the plan and demonstrated an understanding of the instructions.   The patient was advised to call back or seek an in-person evaluation if the symptoms worsen or if the condition fails to improve as anticipated.  I provided 16 minutes of non-face-to-face time during this encounter.   Karle Plumber, MD

## 2018-08-19 NOTE — Telephone Encounter (Signed)
-----   Message from Jackelyn Knife, [place] sent at 08/19/2018  2:12 PM EDT ----- Please contact pt and schedule a 3-2 month f/u

## 2018-08-22 ENCOUNTER — Telehealth: Payer: Self-pay | Admitting: Specialist

## 2018-08-22 DIAGNOSIS — J3081 Allergic rhinitis due to animal (cat) (dog) hair and dander: Secondary | ICD-10-CM | POA: Diagnosis not present

## 2018-08-22 DIAGNOSIS — J3089 Other allergic rhinitis: Secondary | ICD-10-CM | POA: Diagnosis not present

## 2018-08-22 DIAGNOSIS — J301 Allergic rhinitis due to pollen: Secondary | ICD-10-CM | POA: Diagnosis not present

## 2018-08-22 NOTE — Telephone Encounter (Signed)
08/02/18 ov note faxed to [facility][phone]. Signed order for 5 zone mattress previously faxed.

## 2018-08-22 NOTE — Telephone Encounter (Signed)
I called and lmom advising that the request to extend PT has been sent to Dr. [name] to approve, and her notes that were requested from [place] have been sent.

## 2018-08-24 ENCOUNTER — Telehealth: Payer: Self-pay

## 2018-08-24 DIAGNOSIS — J301 Allergic rhinitis due to pollen: Secondary | ICD-10-CM | POA: Diagnosis not present

## 2018-08-24 DIAGNOSIS — J3081 Allergic rhinitis due to animal (cat) (dog) hair and dander: Secondary | ICD-10-CM | POA: Diagnosis not present

## 2018-08-24 DIAGNOSIS — J3089 Other allergic rhinitis: Secondary | ICD-10-CM | POA: Diagnosis not present

## 2018-08-24 NOTE — Telephone Encounter (Signed)
Stacy [name] would like Signed order for 5 zone mattress refaxed to [phone].  Stated that last fax was not readable.  Please advise.  Thank you.

## 2018-08-25 DIAGNOSIS — F411 Generalized anxiety disorder: Secondary | ICD-10-CM | POA: Diagnosis not present

## 2018-08-25 NOTE — Telephone Encounter (Signed)
We got the form

## 2018-08-26 ENCOUNTER — Ambulatory Visit: Payer: Medicaid Other | Admitting: Podiatry

## 2018-08-26 ENCOUNTER — Other Ambulatory Visit: Payer: Self-pay

## 2018-08-26 ENCOUNTER — Encounter: Payer: Self-pay | Admitting: Podiatry

## 2018-08-26 DIAGNOSIS — B351 Tinea unguium: Secondary | ICD-10-CM

## 2018-08-26 DIAGNOSIS — M79676 Pain in unspecified toe(s): Secondary | ICD-10-CM | POA: Diagnosis not present

## 2018-08-26 DIAGNOSIS — E1142 Type 2 diabetes mellitus with diabetic polyneuropathy: Secondary | ICD-10-CM | POA: Diagnosis not present

## 2018-08-26 DIAGNOSIS — L84 Corns and callosities: Secondary | ICD-10-CM | POA: Diagnosis not present

## 2018-08-26 NOTE — Progress Notes (Signed)
Subjective: Stacy [name] presents today with history of neuropathy. Patient seen for follow up of chronic, painful mycotic toenails and calluses b/l hallux which pose a risk due to her diabetes.  Both interfere with daily activities and routine tasks.  Pain is aggravated when wearing enclosed shoe gear. Pain is getting progressively worse and relieved with periodic professional debridement.   She had successful back surgery on June 12th and is doing well from that.   She voices no new pedal concerns on today's visit.  [name], MD is her PCP.    Current Outpatient Medications:  .  ACCU-CHEK SOFTCLIX LANCETS lancets, USE AS INSTRUCTED 3 TIMES A DAY, Disp: 100 each, Rfl: 12 .  albuterol (PROVENTIL) (2.5 MG/3ML) 0.083% nebulizer solution, Take 3 mLs (2.5 mg total) by nebulization every 6 (six) hours as needed for wheezing., Disp: 100 vial, Rfl: 6 .  allopurinol (ZYLOPRIM) 100 MG tablet, TAKE 1 TABLET (100 MG TOTAL) BY MOUTH DAILY., Disp: 90 tablet, Rfl: 3 .  Blood Glucose Monitoring Suppl (ACCU-CHEK AVIVA PLUS) w/Device KIT, 1 each by Does not apply route 3 (three) times daily., Disp: 1 kit, Rfl: 0 .  budesonide-formoterol (SYMBICORT) 80-4.5 MCG/ACT inhaler, INHALE TWO PUFFS BY MOUTH TWICE A DAY (Patient taking differently: Inhale 2 puffs into the lungs 2 (two) times a day. ), Disp: 1 Inhaler, Rfl: 11 .  celecoxib (CELEBREX) 200 MG capsule, Take 1 capsule (200 mg total) by mouth 2 (two) times daily., Disp: 180 capsule, Rfl: 3 .  clopidogrel (PLAVIX) 75 MG tablet, TAKE 1 TABLET (75 MG TOTAL) BY MOUTH DAILY., Disp: 30 tablet, Rfl: 3 .  cromolyn (OPTICROM) 4 % ophthalmic solution, Place 1 drop into both eyes 4 (four) times daily. , Disp: , Rfl:  .  diclofenac sodium (VOLTAREN) 1 % GEL, Apply 4 g topically 4 (four) times daily., Disp: 5 Tube, Rfl: 6 .  docusate sodium (COLACE) 100 MG capsule, Take 1 capsule (100 mg total) by mouth 2 (two) times daily., Disp: 10 capsule, Rfl: 0 .   EPINEPHrine 0.3 mg/0.3 mL IJ SOAJ injection, 0.3 mg IM x 1 PRN for allergic reaction (Patient taking differently: Inject 0.3 mg into the muscle as needed for anaphylaxis. ), Disp: 1 Device, Rfl: 1 .  fluticasone (FLONASE) 50 MCG/ACT nasal spray, Place 1 spray into both nostrils daily., Disp: 16 g, Rfl: 6 .  gabapentin (NEURONTIN) 300 MG capsule, Take 1 capsule (300 mg total) by mouth 3 (three) times daily., Disp: 270 capsule, Rfl: 3 .  glimepiride (AMARYL) 2 MG tablet, Take 1 tablet (2 mg total) by mouth daily., Disp: 30 tablet, Rfl: 6 .  glucose blood (ACCU-CHEK AVIVA PLUS) test strip, USE 3 TIMES A DAY AS DIRECTED BY PHYSICIAN, Disp: 100 each, Rfl: 12 .  hydrochlorothiazide (HYDRODIURIL) 25 MG tablet, Take 1 tablet (25 mg total) by mouth daily., Disp: 90 tablet, Rfl: 3 .  hydrOXYzine (ATARAX/VISTARIL) 10 MG tablet, TAKE 1 TABLET IN THE MORNING AND NOON AND 2 TABLETS IN THE EVENING AS NEEDED (Patient taking differently: Take 10-20 mg by mouth See admin instructions. TAKE 1 TABLET IN THE MORNING AND NOON AND 2 TABLETS IN THE EVENING AS NEEDED for anxiety or itching), Disp: 120 tablet, Rfl: 0 .  Lancets (ACCU-CHEK SOFT TOUCH) lancets, Use as instructed, Disp: 100 each, Rfl: 12 .  loratadine (CLARITIN) 10 MG tablet, Take 1 tablet (10 mg total) by mouth daily., Disp: 30 tablet, Rfl: 11 .  Melatonin 3 MG TABS, Take 1 tablet (3  mg total) by mouth at bedtime as needed. (Patient taking differently: Take 3 mg by mouth at bedtime as needed (sleep). ), Disp: 30 tablet, Rfl: 3 .  methocarbamol (ROBAXIN) 500 MG tablet, Take 1 tablet (500 mg total) by mouth every 8 (eight) hours as needed for muscle spasms., Disp: 60 tablet, Rfl: 3 .  methocarbamol (ROBAXIN) 500 MG tablet, Take 1 tablet (500 mg total) by mouth every 8 (eight) hours as needed for muscle spasms., Disp: 40 tablet, Rfl: 1 .  methocarbamol (ROBAXIN) 500 MG tablet, 1 to 2 tablets by mouth every 8 hrs as needed., Disp: 40 tablet, Rfl: 0 .  MITIGARE 0.6 MG  CAPS, Take 1 capsule by mouth daily., Disp: 30 capsule, Rfl: 3 .  montelukast (SINGULAIR) 10 MG tablet, Take 10 mg by mouth at bedtime. , Disp: , Rfl:  .  Multiple Vitamin (MULTIVITAMIN WITH MINERALS) TABS tablet, Take 1 tablet by mouth daily., Disp: , Rfl:  .  nicotine (NICODERM CQ - DOSED IN MG/24 HOURS) 14 mg/24hr patch, PLACE 1 PATCH ONTO THE SKIN DAILY, Disp: 30 patch, Rfl: 2 .  oxyCODONE-acetaminophen (PERCOCET/ROXICET) 5-325 MG tablet, Take 1 tablet by mouth every 6 (six) hours as needed for severe pain. (Patient not taking: Reported on 08/11/2018), Disp: 40 tablet, Rfl: 0 .  pantoprazole (PROTONIX) 40 MG tablet, Take 1 tablet (40 mg total) by mouth daily., Disp: 30 tablet, Rfl: 6 .  Potassium Chloride ER 20 MEQ TBCR, Take 20 mEq by mouth daily., Disp: 30 tablet, Rfl: 6 .  potassium chloride SA (K-DUR) 20 MEQ tablet, Take 20 mEq by mouth daily., Disp: , Rfl:  .  promethazine (PHENERGAN) 25 MG tablet, Take 1 tablet (25 mg total) by mouth 2 (two) times daily as needed for nausea or vomiting., Disp: 20 tablet, Rfl: 0 .  Respiratory Therapy Supplies (FLUTTER) DEVI, Use after breathing treatment 4 times daily, Disp: 1 each, Rfl: 0 .  simvastatin (ZOCOR) 20 MG tablet, Take 20 mg by mouth at bedtime., Disp: , Rfl:  .  sucralfate (CARAFATE) 1 g tablet, Take 1 g by mouth 4 (four) times daily -  with meals and at bedtime., Disp: , Rfl:  .  tiotropium (SPIRIVA HANDIHALER) 18 MCG inhalation capsule, Place 1 capsule (18 mcg total) into inhaler and inhale daily., Disp: 30 capsule, Rfl: 12 .  valsartan-hydrochlorothiazide (DIOVAN-HCT) 160-12.5 MG tablet, Take 1 tablet by mouth daily. Must keep upcoming appt for refills. (Patient taking differently: Take 1 tablet by mouth daily. ), Disp: 30 tablet, Rfl: 6  Allergies  Allergen Reactions  . Other Shortness Of Breath    UNSPECIFIED AGENTS Allergic to perfumes and cleaning products  . Ace Inhibitors Cough and Other (See Comments)       . Shellfish Allergy  Other (See Comments)    Per allergy test.  . Aspirin Nausea Only    Objective:  Vascular Examination: Capillary refill time <4 seconds x 10 digits.  Dorsalis pedis pulses 1/4 b/l.  Posterior tibial pulses 1/4 left; 0.4 right.  No digital hair x 10 digits.  Skin temperature WNL b/l.  Dermatological Examination: Skin with normal turgor, texture and tone b/l.  Toenails 1-5 b/l discolored, thick, dystrophic with subungual debris and pain with palpation to nailbeds due to thickness of nails.  Hyperkeratotic lesions hallux IPJ b/l. No erythema, no edema, no drainage, no flocculence noted.   Musculoskeletal: Muscle strength 5/5 to all LE muscle groups.  Mild HAV deformity b/l.  Neurological: Sensation diminished with 10 gram monofilament.  Vibratory sensation diminished b/l.  Assessment: 1. Painful onychomycosis toenails 1-5 b/l 2. Calluses b/l hallux 3. NIDDM with neuropathy  Plan: 1. Toenails 1-5 b/l were debrided in length and girth without iatrogenic bleeding. 2. Calluses pared b/l hallux utilizing sterile scalpel blade without incident. Patient to continue soft, supportive shoe gear daily. 3. Patient to report any pedal injuries to medical professional immediately. 4. Follow up 3 months.  5. Patient/POA to call should there be a concern in the interim.

## 2018-08-26 NOTE — Patient Instructions (Signed)
Diabetes Mellitus and Foot Care Foot care is an important part of your health, especially when you have diabetes. Diabetes may cause you to have problems because of poor blood flow (circulation) to your feet and legs, which can cause your skin to:  Become thinner and drier.  Break more easily.  Heal more slowly.  Peel and crack. You may also have nerve damage (neuropathy) in your legs and feet, causing decreased feeling in them. This means that you may not notice minor injuries to your feet that could lead to more serious problems. Noticing and addressing any potential problems early is the best way to prevent future foot problems. How to care for your feet Foot hygiene  Wash your feet daily with warm water and mild soap. Do not use hot water. Then, pat your feet and the areas between your toes until they are completely dry. Do not soak your feet as this can dry your skin.  Trim your toenails straight across. Do not dig under them or around the cuticle. File the edges of your nails with an emery board or nail file.  Apply a moisturizing lotion or petroleum jelly to the skin on your feet and to dry, brittle toenails. Use lotion that does not contain alcohol and is unscented. Do not apply lotion between your toes. Shoes and socks  Wear clean socks or stockings every day. Make sure they are not too tight. Do not wear knee-high stockings since they may decrease blood flow to your legs.  Wear shoes that fit properly and have enough cushioning. Always look in your shoes before you put them on to be sure there are no objects inside.  To break in new shoes, wear them for just a few hours a day. This prevents injuries on your feet. Wounds, scrapes, corns, and calluses  Check your feet daily for blisters, cuts, bruises, sores, and redness. If you cannot see the bottom of your feet, use a mirror or ask someone for help.  Do not cut corns or calluses or try to remove them with medicine.  If you  find a minor scrape, cut, or break in the skin on your feet, keep it and the skin around it clean and dry. You may clean these areas with mild soap and water. Do not clean the area with peroxide, alcohol, or iodine.  If you have a wound, scrape, corn, or callus on your foot, look at it several times a day to make sure it is healing and not infected. Check for: ? Redness, swelling, or pain. ? Fluid or blood. ? Warmth. ? Pus or a bad smell. General instructions  Do not cross your legs. This may decrease blood flow to your feet.  Do not use heating pads or hot water bottles on your feet. They may burn your skin. If you have lost feeling in your feet or legs, you may not know this is happening until it is too late.  Protect your feet from hot and cold by wearing shoes, such as at the beach or on hot pavement.  Schedule a complete foot exam at least once a year (annually) or more often if you have foot problems. If you have foot problems, report any cuts, sores, or bruises to your health care provider immediately. Contact a health care provider if:  You have a medical condition that increases your risk of infection and you have any cuts, sores, or bruises on your feet.  You have an injury that is not   healing.  You have redness on your legs or feet.  You feel burning or tingling in your legs or feet.  You have pain or cramps in your legs and feet.  Your legs or feet are numb.  Your feet always feel cold.  You have pain around a toenail. Get help right away if:  You have a wound, scrape, corn, or callus on your foot and: ? You have pain, swelling, or redness that gets worse. ? You have fluid or blood coming from the wound, scrape, corn, or callus. ? Your wound, scrape, corn, or callus feels warm to the touch. ? You have pus or a bad smell coming from the wound, scrape, corn, or callus. ? You have a fever. ? You have a red line going up your leg. Summary  Check your feet every day  for cuts, sores, red spots, swelling, and blisters.  Moisturize feet and legs daily.  Wear shoes that fit properly and have enough cushioning.  If you have foot problems, report any cuts, sores, or bruises to your health care provider immediately.  Schedule a complete foot exam at least once a year (annually) or more often if you have foot problems. This information is not intended to replace advice given to you by your health care provider. Make sure you discuss any questions you have with your health care provider. Document Released: 01/17/2000 Document Revised: 03/03/2017 Document Reviewed: 02/21/2016 Elsevier Patient Education  2020 [employer]   Onychomycosis/Fungal Toenails  WHAT IS IT? An infection that lies within the keratin of your nail plate that is caused by a fungus.  WHY ME? Fungal infections affect all ages, sexes, races, and creeds.  There may be many factors that predispose you to a fungal infection such as age, coexisting medical conditions such as diabetes, or an autoimmune disease; stress, medications, fatigue, genetics, etc.  Bottom line: fungus thrives in a warm, moist environment and your shoes offer such a location.  IS IT CONTAGIOUS? Theoretically, yes.  You do not want to share shoes, nail clippers or files with someone who has fungal toenails.  Walking around barefoot in the same room or sleeping in the same bed is unlikely to transfer the organism.  It is important to realize, however, that fungus can spread easily from one nail to the next on the same foot.  HOW DO WE TREAT THIS?  There are several ways to treat this condition.  Treatment may depend on many factors such as age, medications, pregnancy, liver and kidney conditions, etc.  It is best to ask your doctor which options are available to you.  1. No treatment.   Unlike many other medical concerns, you can live with this condition.  However for many people this can be a painful condition and may lead to  ingrown toenails or a bacterial infection.  It is recommended that you keep the nails cut short to help reduce the amount of fungal nail. 2. Topical treatment.  These range from herbal remedies to prescription strength nail lacquers.  About 40-50% effective, topicals require twice daily application for approximately 9 to 12 months or until an entirely new nail has grown out.  The most effective topicals are medical grade medications available through physicians offices. 3. Oral antifungal medications.  With an 80-90% cure rate, the most common oral medication requires 3 to 4 months of therapy and stays in your system for a year as the new nail grows out.  Oral antifungal medications do require   blood work to make sure it is a safe drug for you.  A liver function panel will be performed prior to starting the medication and after the first month of treatment.  It is important to have the blood work performed to avoid any harmful side effects.  In general, this medication safe but blood work is required. 4. Laser Therapy.  This treatment is performed by applying a specialized laser to the affected nail plate.  This therapy is noninvasive, fast, and non-painful.  It is not covered by insurance and is therefore, out of pocket.  The results have been very good with a 80-95% cure rate.  The [facility] is the only practice in the area to offer this therapy. 5. Permanent Nail Avulsion.  Removing the entire nail so that a new nail will not grow back. 

## 2018-08-29 DIAGNOSIS — J3089 Other allergic rhinitis: Secondary | ICD-10-CM | POA: Diagnosis not present

## 2018-08-29 DIAGNOSIS — J3081 Allergic rhinitis due to animal (cat) (dog) hair and dander: Secondary | ICD-10-CM | POA: Diagnosis not present

## 2018-08-29 DIAGNOSIS — J301 Allergic rhinitis due to pollen: Secondary | ICD-10-CM | POA: Diagnosis not present

## 2018-08-31 ENCOUNTER — Ambulatory Visit (INDEPENDENT_AMBULATORY_CARE_PROVIDER_SITE_OTHER): Payer: Medicaid Other | Admitting: Surgery

## 2018-08-31 ENCOUNTER — Other Ambulatory Visit: Payer: Self-pay

## 2018-08-31 ENCOUNTER — Encounter: Payer: Self-pay | Admitting: Surgery

## 2018-08-31 VITALS — BP 125/66 | HR 85 | Ht 66.0 in | Wt 182.0 lb

## 2018-08-31 DIAGNOSIS — Z9889 Other specified postprocedural states: Secondary | ICD-10-CM

## 2018-08-31 MED ORDER — HYDROCODONE-ACETAMINOPHEN 7.5-325 MG PO TABS: 1.0000 | ORAL_TABLET | Freq: Four times a day (QID) | ORAL | 0 refills | Status: DC | PRN

## 2018-08-31 NOTE — Progress Notes (Signed)
58 year old black female who is about 5-week status post left L3-4 microdiscectomy returns.  States that she is doing well.  Continues to have some numbness and tingling in the left leg but not too bad.  Pain is gone.  She has been released from home health PT and needs to start outpatient therapy.  Exam Pleasant white female alert and oriented in no acute distress.  Surgical incision is well-healed.  Ambulating very well with single prong cane.  Neuro vas intact.  No focal motor deficits.  Plan Patient will start outpatient PT at [facility].  Follow-up in 6 weeks for recheck.  Given prescription for Norco 7.5/325.

## 2018-09-01 DIAGNOSIS — F411 Generalized anxiety disorder: Secondary | ICD-10-CM | POA: Diagnosis not present

## 2018-09-05 DIAGNOSIS — J3081 Allergic rhinitis due to animal (cat) (dog) hair and dander: Secondary | ICD-10-CM | POA: Diagnosis not present

## 2018-09-05 DIAGNOSIS — J301 Allergic rhinitis due to pollen: Secondary | ICD-10-CM | POA: Diagnosis not present

## 2018-09-05 DIAGNOSIS — J3089 Other allergic rhinitis: Secondary | ICD-10-CM | POA: Diagnosis not present

## 2018-09-08 ENCOUNTER — Encounter: Payer: Self-pay | Admitting: Physical Medicine & Rehabilitation

## 2018-09-08 ENCOUNTER — Other Ambulatory Visit: Payer: Self-pay

## 2018-09-08 ENCOUNTER — Encounter: Payer: Medicaid Other | Attending: Physical Medicine & Rehabilitation | Admitting: Physical Medicine & Rehabilitation

## 2018-09-08 VITALS — BP 118/60 | HR 79 | Temp 98.2°F | Ht 66.0 in | Wt 186.2 lb

## 2018-09-08 DIAGNOSIS — Z96651 Presence of right artificial knee joint: Secondary | ICD-10-CM | POA: Insufficient documentation

## 2018-09-08 DIAGNOSIS — M961 Postlaminectomy syndrome, not elsewhere classified: Secondary | ICD-10-CM

## 2018-09-08 DIAGNOSIS — R202 Paresthesia of skin: Secondary | ICD-10-CM

## 2018-09-08 DIAGNOSIS — M25561 Pain in right knee: Secondary | ICD-10-CM | POA: Insufficient documentation

## 2018-09-08 DIAGNOSIS — R269 Unspecified abnormalities of gait and mobility: Secondary | ICD-10-CM | POA: Diagnosis not present

## 2018-09-08 DIAGNOSIS — G8929 Other chronic pain: Secondary | ICD-10-CM | POA: Insufficient documentation

## 2018-09-08 DIAGNOSIS — M5416 Radiculopathy, lumbar region: Secondary | ICD-10-CM

## 2018-09-08 DIAGNOSIS — G894 Chronic pain syndrome: Secondary | ICD-10-CM | POA: Diagnosis not present

## 2018-09-08 DIAGNOSIS — F411 Generalized anxiety disorder: Secondary | ICD-10-CM | POA: Diagnosis not present

## 2018-09-08 MED ORDER — GABAPENTIN 600 MG PO TABS: 600.0000 mg | ORAL_TABLET | Freq: Three times a day (TID) | ORAL | 1 refills | Status: DC

## 2018-09-08 NOTE — Progress Notes (Addendum)
Subjective:    Patient ID: Stacy ConesPamela Eleazer Nason, female    DOB: Aug 01, 1960, 58 y.o.   MRN: [id number]  HPI Female with pmh/psh RLS, neuropathy, HTN, headache, GERD, DM, COPD, lumbar spinal stenosis with claudication s/p lumbar surgery x3 (most recent 07/15/2018), DJD, PAD right TKA in 2017 followed up by manipulation x2 presents LE pain. Initially stated: History taken from chart review and patient. Started ~2018.  States she had a knee pain with buckling.  Denies inciting event.  Getting improve.  Ice/heat improves the pain.  Prolonged standing exacerbates the pain.  Non-radiating. Sharp.  Intermittent.  Denies associated numbness.  Denies falls. Pain limits prolonged standing and ambulation.   Last clinic visit 08/11/2018.  Since that time, she obtained TENS which provides her with benefit. She has not tried Lidoderm patch. Denies falls. She is still in therapies. She went to Ortho regarding tingling, but notes overall improvement.   Pain Inventory Average Pain 6 Pain Right Now 6 My pain is intermittent, sharp, stabbing and aching  In the last 24 hours, has pain interfered with the following? General activity 0 Relation with others 0 Enjoyment of life 0 What TIME of day is your pain at its worst? all Sleep (in general) Fair  Pain is worse with: walking, bending, sitting and standing Pain improves with: medication Relief from Meds: 1  Mobility use a walker how many minutes can you walk? 5 ability to climb steps?  no do you drive?  no  Function not employed: date last employed n/a disabled: date disabled 2016  Neuro/Psych trouble walking spasms  Prior Studies Any changes since last visit?  no CT/MRI  Physicians involved in your care Orthopedist Alexis GoodellNitka Jamesie, MD   Family History  Problem Relation Age of Onset  . Hypertension Father   . Cancer Father   . Heart disease Mother   . Asthma Son        had as a child  . Heart disease Sister   . Breast cancer Sister    . Hypertension Brother    Social History   Socioeconomic History  . Marital status: Married    Spouse name: Not on file  . Number of children: 2  . Years of education: Not on file  . Highest education level: Not on file  Occupational History  . Occupation: unemployed    Associate Professormployer: UNEMPLOYED  Social Needs  . Financial resource strain: Not on file  . Food insecurity    Worry: Not on file    Inability: Not on file  . Transportation needs    Medical: Not on file    Non-medical: Not on file  Tobacco Use  . Smoking status: Current Some Day Smoker    Packs/day: 0.25    Years: 32.00    Pack years: 8.00    Types: Cigarettes  . Smokeless tobacco: Never Used  Substance and Sexual Activity  . Alcohol use: No  . Drug use: No  . Sexual activity: Yes    Birth control/protection: Surgical  Lifestyle  . Physical activity    Days per week: Not on file    Minutes per session: Not on file  . Stress: Not on file  Relationships  . Social Musicianconnections    Talks on phone: Not on file    Gets together: Not on file    Attends religious service: Not on file    Active member of club or organization: Not on file    Attends meetings of clubs or  organizations: Not on file    Relationship status: Not on file  Other Topics Concern  . Not on file  Social History Narrative  . Not on file   Past Surgical History:  Procedure Laterality Date  . CHOLECYSTECTOMY    . COLONOSCOPY    . ENDOMETRIAL ABLATION  10/2010  . HERNIA REPAIR     umbicial hernia  . KNEE ARTHROSCOPY Left    [facility]07[facility]  . KNEE CLOSED REDUCTION Right 12/06/2015   Procedure: CLOSED MANIPULATION RIGHT KNEE;  Surgeon: Kerrin ChampagneJames E Nitka, MD;  Location: [facility] OR;  Service: Orthopedics;  Laterality: Right;  . KNEE CLOSED REDUCTION Right 01/17/2016   Procedure: CLOSED MANIPULATION RIGHT KNEE;  Surgeon: Kerrin ChampagneJames E Nitka, MD;  Location: [facility] OR;  Service: Orthopedics;  Laterality: Right;  . KNEE JOINT MANIPULATION Right  12/06/2015  . LUMBAR DISC SURGERY  06/03/2015   L 2  L3 L4 L5   . LUMBAR LAMINECTOMY/DECOMPRESSION MICRODISCECTOMY N/A 06/03/2015   Procedure: Bilateral lateral recess decompression L2-3, L3-4, L4-5;  Surgeon: Kerrin ChampagneJames E Nitka, MD;  Location: [facility] OR;  Service: Orthopedics;  Laterality: N/A;  . LUMBAR LAMINECTOMY/DECOMPRESSION MICRODISCECTOMY N/A 10/02/2016   Procedure: Right L5-S1 Lateral Recess Decompression  microdiscectomy;  Surgeon: Kerrin ChampagneNitka, James E, MD;  [facility];  Service: Orthopedics;  Laterality: N/A;  . LUMBAR LAMINECTOMY/DECOMPRESSION MICRODISCECTOMY N/A 07/15/2018   Procedure: LEFT L3-4 MICRODISCECTOMY;  Surgeon: Kerrin ChampagneNitka, James E, MD;  Location: [facility] OR;  Service: Orthopedics;  Laterality: N/A;  . svd      x 2  . TOTAL KNEE ARTHROPLASTY Right 09/06/2015   Procedure: RIGHT TOTAL KNEE ARTHROPLASTY;  Surgeon: Kerrin ChampagneJames E Nitka, MD;  Location: [facility] OR;  Service: Orthopedics;  Laterality: Right;  . TUBAL LIGATION    . UPPER GASTROINTESTINAL ENDOSCOPY  04/28/11   Past Medical History:  Diagnosis Date  . Allergy    Shellfish, cleaning products  . Arthritis   . 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 Asthma   . COPD (chronic obstructive pulmonary disease) (HCC)   . Diabetes mellitus    Type II  . GERD (gastroesophageal reflux disease)    Pt on Protonix daily  . Glaucoma   . Gout   . Headache(784.0)    otc meds prn  . Hyperlipidemia   . Hypertension   . Irritable bowel syndrome 11/19/2010  . Neuropathy   . Pneumonia   . Restless legs   . Shortness of breath    07/14/2018- uses  4 times a day   BP 118/60   Pulse 79   Temp 98.2 F (36.8 C)   Ht 5\' 6"  (1.676 m)   Wt 186 lb 3.2 oz (84.5 kg)   SpO2 97%   BMI 30.05 kg/m   Opioid Risk Score:   Fall Risk Score:  `1  Depression screen PHQ 2/9  [facility] 08/11/2018 08/09/2018 06/06/2018 03/30/2018 02/14/2018 12/06/2017 11/11/2017  Decreased Interest 0 0 0 0 0 0 0  Down, Depressed, Hopeless 0 0 0 0 0 0 0   PHQ - 2 Score 0 0 0 0 0 0 0  Altered sleeping - 0 0 - - 0 1  Tired, decreased energy - 0 0 - - 2 0  Change in appetite - 0 0 - - 1 1  Feeling bad or failure about yourself  - 0 0 - - 0 0  Trouble concentrating - 0 0 - - 0 0  Moving  slowly or fidgety/restless - 0 0 - - 0 0  Suicidal thoughts - 0 - - - 0 0  PHQ-9 Score - 0 0 - - 3 2  Difficult doing work/chores - Not difficult at all - - - - -  Some recent data might be hidden    Review of Systems  Constitutional: Positive for diaphoresis.  HENT: Negative.   Eyes: Negative.   Respiratory: Negative.   Cardiovascular: Negative.   Gastrointestinal: Negative.   Endocrine: Negative.   Genitourinary: Negative.   Musculoskeletal: Positive for gait problem.  Skin: Negative.   Allergic/Immunologic: Negative.   Hematological: Negative.   Psychiatric/Behavioral: Negative.   All other systems reviewed and are negative.      Objective:   Physical Exam Gen: NAD. Vital signs reviewed HENT: Normocephalic. Atraumatic.  Eyes: EOMI. No discharge.  Cardio: No JVD. Pulm: Effort normal. Abd: Non-distended MSK:  Gait WNL.  No TTP b/l LE  No edema.  Neuro:  Sensation diminished to light touch in all LLE  Strength  5/5 in all LE myotomes Skin: Warm and Dry. Intact    Assessment & Plan:  Female with pmh/psh RLS, neuropathy, HTN, headache, GERD, DM, COPD, lumbar spinal stenosis with claudication s/p lumbar surgery x3 (most recent 07/15/2018), DJD, PAD right TKA in 2017 followed up by manipulation x2 presents with LE pain.  1. Chronic right knee pain s/p TKA with manipulation x2  MRI from 09/2017 showing postop changes  Labs reviewed  Referral information reviewed  PMAWARE reviewed  Cont Heat/Cold  Cont HH PT, transitioning to outpatient PT  Cont E-stim in past  Will consider bracing if necessary  Encouraged trial OTC Lidoderm patch again  Will consider Gabapentin  Will consider Cymbalta  Will consider Robaxin   Will consider Mobic   Patient states main goal is to be more active, which she is doing  Improving   2. Gait abnormality  Cont rollator if needed, patient able to balance on 1 leg with rollator while tieing showes  Cont therapies  See #1  3. LLE pain  Will increase Gabapentin to 600 TID  See #1  Patient would like to follow up in 2 months

## 2018-09-12 DIAGNOSIS — J3089 Other allergic rhinitis: Secondary | ICD-10-CM | POA: Diagnosis not present

## 2018-09-12 DIAGNOSIS — J301 Allergic rhinitis due to pollen: Secondary | ICD-10-CM | POA: Diagnosis not present

## 2018-09-12 DIAGNOSIS — J3081 Allergic rhinitis due to animal (cat) (dog) hair and dander: Secondary | ICD-10-CM | POA: Diagnosis not present

## 2018-09-13 ENCOUNTER — Other Ambulatory Visit: Payer: Self-pay | Admitting: Internal Medicine

## 2018-09-13 ENCOUNTER — Ambulatory Visit: Payer: Medicaid Other | Attending: Specialist | Admitting: Physical Therapy

## 2018-09-13 ENCOUNTER — Telehealth: Payer: Self-pay | Admitting: Specialist

## 2018-09-13 ENCOUNTER — Other Ambulatory Visit: Payer: Self-pay

## 2018-09-13 ENCOUNTER — Encounter: Payer: Self-pay | Admitting: Physical Therapy

## 2018-09-13 DIAGNOSIS — M62830 Muscle spasm of back: Secondary | ICD-10-CM | POA: Insufficient documentation

## 2018-09-13 DIAGNOSIS — R262 Difficulty in walking, not elsewhere classified: Secondary | ICD-10-CM | POA: Diagnosis not present

## 2018-09-13 DIAGNOSIS — M5441 Lumbago with sciatica, right side: Secondary | ICD-10-CM | POA: Diagnosis not present

## 2018-09-13 DIAGNOSIS — M5442 Lumbago with sciatica, left side: Secondary | ICD-10-CM

## 2018-09-13 NOTE — Telephone Encounter (Signed)
Patient called advised the pharmacy need prior approval before they can fill the Rx for Hydrocodone. The number to contact patient is [phone]

## 2018-09-13 NOTE — Therapy (Signed)
[facility] [address] [place], [place], [address] Phone: [phone]   Fax:  [phone]  Physical Therapy Evaluation  Patient Details  [facility]: [name] MRN: [id number] Date of Birth: 03/23/1960 Referring Provider (PT): Otelia SergeantNitka   Encounter Date: 09/13/2018  PT End of Session - 09/13/18 0825    Visit Number  1    Number of Visits  4    Authorization Type  Medicaid    PT Start Time  0753    PT Stop Time  0826    PT Time Calculation (min)  33 min    Activity Tolerance  Patient tolerated treatment well    Behavior During Therapy  [facility] for tasks assessed/performed       Past Medical History:  Diagnosis Date  . Allergy    Shellfish, cleaning products  . Arthritis   . 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 Asthma   . COPD (chronic obstructive pulmonary disease) (HCC)   . Diabetes mellitus    Type II  . GERD (gastroesophageal reflux disease)    Pt on Protonix daily  . Glaucoma   . Gout   . Headache(784.0)    otc meds prn  . Hyperlipidemia   . Hypertension   . Irritable bowel syndrome 11/19/2010  . Neuropathy   . Pneumonia   . Restless legs   . Shortness of breath    07/14/2018- uses  4 times a day    Past Surgical History:  Procedure Laterality Date  . CHOLECYSTECTOMY    . COLONOSCOPY    . ENDOMETRIAL ABLATION  10/2010  . HERNIA REPAIR     umbicial hernia  . KNEE ARTHROSCOPY Left    [facility]07[facility]  . KNEE CLOSED REDUCTION Right 12/06/2015   Procedure: CLOSED MANIPULATION RIGHT KNEE;  Surgeon: Kerrin ChampagneJames E Nitka, MD;  [facility]: [facility] OR;  Service: Orthopedics;  Laterality: Right;  . KNEE CLOSED REDUCTION Right 01/17/2016   Procedure: CLOSED MANIPULATION RIGHT KNEE;  Surgeon: Kerrin ChampagneJames E Nitka, MD;  Location: [facility] OR;  Service: Orthopedics;  Laterality: Right;  . KNEE JOINT MANIPULATION Right 12/06/2015  . LUMBAR DISC SURGERY  06/03/2015   L 2  L3 L4 L5   .  LUMBAR LAMINECTOMY/DECOMPRESSION MICRODISCECTOMY N/A 06/03/2015   Procedure: Bilateral lateral recess decompression L2-3, L3-4, L4-5;  Surgeon: Kerrin ChampagneJames E Nitka, MD;  Location: [facility] OR;  Service: Orthopedics;  Laterality: N/A;  . LUMBAR LAMINECTOMY/DECOMPRESSION MICRODISCECTOMY N/A 10/02/2016   Procedure: Right L5-S1 Lateral Recess Decompression  microdiscectomy;  Surgeon: Kerrin ChampagneNitka, James E, MD;  [facility];  Service: Orthopedics;  Laterality: N/A;  . LUMBAR LAMINECTOMY/DECOMPRESSION MICRODISCECTOMY N/A 07/15/2018   Procedure: LEFT L3-4 MICRODISCECTOMY;  Surgeon: Kerrin ChampagneNitka, James E, MD;  [facility]: [facility] OR;  Service: Orthopedics;  Laterality: N/A;  . svd      x 2  . TOTAL KNEE ARTHROPLASTY Right 09/06/2015   Procedure: RIGHT TOTAL KNEE ARTHROPLASTY;  Surgeon: Kerrin ChampagneJames E Nitka, MD;  [facility]: [facility] OR;  Service: Orthopedics;  Laterality: Right;  . TUBAL LIGATION    . UPPER GASTROINTESTINAL ENDOSCOPY  04/28/11    There were no vitals filed for this visit.   Subjective Assessment - 09/13/18 0757    Subjective  Patient underwent a left L3-4 microdiscectomy on 07/15/18.  She had been having left leg pain with her back pain, she reports that she is still having some tingling in the left leg..Marland Kitchen  Pertinent History  multiple back surgeries, TKR's, COPD    Limitations  Lifting;Standing;Walking;House hold activities    Patient Stated Goals  have less pain and better motions and movements    Currently in Pain?  Yes    Pain Score  2     Pain Location  Back    Pain Orientation  Left;Lower    Pain Descriptors / Indicators  Aching;Numbness;Tingling    Pain Type  Acute pain;Surgical pain    Pain Radiating Towards  some pain in the left lateral and anterior thigh    Pain Onset  More than a month ago    Pain Frequency  Constant    Aggravating Factors   activity, standing pain can be up to 7/10    Pain Relieving Factors  at rest at times pain can be 0/10    Effect of Pain on Daily Activities  limits all ADL's          [facility] PT Assessment - 09/13/18 0001      Assessment   Medical Diagnosis  LBP s/p L3-4 microdiscectomy    Referring Provider (PT)  [name]    Onset Date/Surgical Date  07/15/18    Prior Therapy  for knees and back      Precautions   Precautions  Back    Precaution Comments  be careful with lifting, bending and twisting      Balance Screen   Has the patient fallen in the past 6 months  No    Has the patient had a decrease in activity level because of a fear of falling?   No    Is the patient reluctant to leave their home because of a fear of falling?   No      Home Environment   Additional Comments  has stairs, does some housework      Prior Function   Level of Independence  Independent with household mobility with device    Vocation  On disability    Leisure  no exercise      Posture/Postural Control   Posture Comments  fwd head, rounded shoulders, stooped with forward flexion with stnading      AROM   Overall AROM Comments  Lumbar ROM decreased 75% with guarded motions and pain and fear, she keeps the right knee straight and does not like to bend it had TKR on this side 2016, right knee AROM 10-90 degrees flexion      Strength   Overall Strength Comments  4-/5 for the LE's mild increase of pain      Flexibility   Hamstrings  tight    Quadriceps  very tight    ITB  tight    Piriformis  tight      Palpation   Palpation comment  she is very tight in the lumbar paraspinals, tight and tender here and in the left thigh      Ambulation/Gait   Gait Comments  .                Objective measurements completed on examination: See above findings.                PT Short Term Goals - 09/13/18 0831      PT SHORT TERM GOAL #1   Title  I with initial HEP    Time  2    Period  Weeks    Status  New        PT Long Term Goals -  09/13/18 0831      PT LONG TERM GOAL #1   Title  I with advanced HEP    Time  12    Period  Weeks    Status  New       PT LONG TERM GOAL #2   Title  increase lumbar ROM 25%    Time  12    Period  Weeks    Status  New      PT LONG TERM GOAL #3   Title  decreased pain 50% with ADLs    Time  12    Period  Weeks    Status  New      PT LONG TERM GOAL #4   Title  report able to go up and down stairs without spasms    Time  12    Period  Weeks    Status  New             Plan - 09/13/18 0825    Clinical Impression Statement  Patient was being seen here earlier in the year for back pain with radicular symptoms.  She then underwent a left L3-4 microdiscectomy on 07/15/18.  She does report having 3 visits of home health PT.  She does report pain is overall better and to me she looks like she is walking better, she still has limited and gaurded ROM, she does have right knee issues from a past TKR that currently has limited ROM.  She does have tightness in the lumbar parapsinals    Personal Factors and Comorbidities  Fitness;Past/Current Experience;Comorbidity 3+;Social Background;Education;Transportation    Comorbidities  Gout, 2 past back surgeries, TKR's, COPD    Examination-Activity Limitations  Bend;Stairs;Squat;Stand;Lift;Transfers;Locomotion Level    Examination-Participation Restrictions  Cleaning;Laundry    Stability/Clinical Decision Making  Evolving/Moderate complexity    Clinical Decision Making  Moderate    Rehab Potential  Good    PT Frequency  1x / week    PT Duration  12 weeks    PT Treatment/Interventions  ADLs/Self Care Home Management;Cryotherapy;Electrical Stimulation;Moist Heat;Traction;Ultrasound;Functional mobility training;Patient/family education;Therapeutic exercise;Therapeutic activities;Manual techniques;Dry needling;Neuromuscular re-education    PT Next Visit Plan  We will submit to Medicaid for authorization to treat her.  There may be issues due to her being seen here prior to surgery and then her having some home PT after surgery.  Plan would be to help her with strength  and function    Consulted and Agree with Plan of Care  Patient       Patient will benefit from skilled therapeutic intervention in order to improve the following deficits and impairments:  Abnormal gait, Decreased range of motion, Difficulty walking, Increased muscle spasms, Decreased endurance, Cardiopulmonary status limiting activity, Decreased activity tolerance, Pain, Improper body mechanics, Impaired flexibility, Decreased strength, Postural dysfunction  Visit Diagnosis: 1. Acute bilateral low back pain with bilateral sciatica   2. Muscle spasm of back   3. Difficulty in walking, not elsewhere classified        Problem List Patient Active Problem List   Diagnosis Date Noted  . Chronic pain syndrome 09/08/2018  . Chronic pain of right knee 08/11/2018  . Status post lumbar laminectomy 07/15/2018  . Moderate persistent asthma without complication 06/29/2017  . Environmental and seasonal allergies 06/29/2017  . Controlled type 2 diabetes mellitus with diabetic polyneuropathy, without long-term current use of insulin (HCC) 06/29/2017  . Perennial allergic rhinitis 04/08/2017  . Sensorineural hearing loss (SNHL), bilateral 04/08/2017  . Chronic pansinusitis 03/25/2017  .  Eustachian tube dysfunction, bilateral 03/25/2017  . 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 Herniation of lumbar intervertebral disc with radiculopathy 10/02/2016    Class: Chronic  . Alopecia areata 08/19/2016  . Chondromalacia of both patellae 06/03/2015    Class: Chronic  . Spinal stenosis, lumbar region, with neurogenic claudication 06/03/2015  . Tobacco use disorder 04/25/2015  . DJD (degenerative joint disease) of knee 01/04/2015  . Hemorrhoid 11/14/2014  . Gout of big toe 07/19/2014  . Essential hypertension 08/14/2013  . Gastroesophageal reflux disease without esophagitis 08/14/2013  . COPD (chronic obstructive pulmonary disease) (HCC) 04/17/2011    Jearld LeschALBRIGHT,Marisel Tostenson W., PT 09/13/2018, 8:37 AM  [facility] [address] [place], [place], [address] Phone: [phone]   Fax:  [phone]  [facility]: [name] MRN: [id number] Date of Birth: 1960/02/19

## 2018-09-13 NOTE — Telephone Encounter (Signed)
percert has been done and I faxed it to the pharmacy

## 2018-09-14 ENCOUNTER — Other Ambulatory Visit: Payer: Self-pay | Admitting: Internal Medicine

## 2018-09-14 DIAGNOSIS — F17200 Nicotine dependence, unspecified, uncomplicated: Secondary | ICD-10-CM

## 2018-09-15 ENCOUNTER — Telehealth: Payer: Self-pay | Admitting: Internal Medicine

## 2018-09-15 DIAGNOSIS — F411 Generalized anxiety disorder: Secondary | ICD-10-CM | POA: Diagnosis not present

## 2018-09-15 DIAGNOSIS — E119 Type 2 diabetes mellitus without complications: Secondary | ICD-10-CM

## 2018-09-15 NOTE — Telephone Encounter (Signed)
New Message   1) Medication(s) Requested (by name): hydrOXYzine (ATARAX/VISTARIL) 10 MG tablet  2) Pharmacy of Choice: [facility] on [address]  3) Special Requests:   Approved medications will be sent to the pharmacy, we will reach out if there is an issue.  Requests made after 3pm may not be addressed until the following business day!  If a patient is unsure of the name of the medication(s) please note and ask patient to call back when they are able to provide all info, do not send to responsible party until all information is available!

## 2018-09-16 MED ORDER — HYDROXYZINE HCL 10 MG PO TABS: ORAL_TABLET | ORAL | 2 refills | Status: DC

## 2018-09-16 NOTE — Telephone Encounter (Signed)
Refills sent

## 2018-09-19 DIAGNOSIS — J301 Allergic rhinitis due to pollen: Secondary | ICD-10-CM | POA: Diagnosis not present

## 2018-09-19 DIAGNOSIS — J3089 Other allergic rhinitis: Secondary | ICD-10-CM | POA: Diagnosis not present

## 2018-09-19 DIAGNOSIS — J3081 Allergic rhinitis due to animal (cat) (dog) hair and dander: Secondary | ICD-10-CM | POA: Diagnosis not present

## 2018-09-20 ENCOUNTER — Other Ambulatory Visit: Payer: Self-pay | Admitting: Internal Medicine

## 2018-09-20 DIAGNOSIS — E119 Type 2 diabetes mellitus without complications: Secondary | ICD-10-CM

## 2018-09-22 DIAGNOSIS — F411 Generalized anxiety disorder: Secondary | ICD-10-CM | POA: Diagnosis not present

## 2018-09-26 DIAGNOSIS — J301 Allergic rhinitis due to pollen: Secondary | ICD-10-CM | POA: Diagnosis not present

## 2018-09-26 DIAGNOSIS — J3089 Other allergic rhinitis: Secondary | ICD-10-CM | POA: Diagnosis not present

## 2018-09-26 DIAGNOSIS — J3081 Allergic rhinitis due to animal (cat) (dog) hair and dander: Secondary | ICD-10-CM | POA: Diagnosis not present

## 2018-09-28 ENCOUNTER — Other Ambulatory Visit: Payer: Self-pay

## 2018-09-28 ENCOUNTER — Ambulatory Visit: Payer: Medicaid Other | Admitting: Physical Therapy

## 2018-09-28 DIAGNOSIS — R262 Difficulty in walking, not elsewhere classified: Secondary | ICD-10-CM | POA: Diagnosis not present

## 2018-09-28 DIAGNOSIS — M5442 Lumbago with sciatica, left side: Secondary | ICD-10-CM | POA: Diagnosis not present

## 2018-09-28 DIAGNOSIS — M62830 Muscle spasm of back: Secondary | ICD-10-CM | POA: Diagnosis not present

## 2018-09-28 DIAGNOSIS — M5441 Lumbago with sciatica, right side: Secondary | ICD-10-CM

## 2018-09-28 NOTE — Therapy (Signed)
[facility] [address] [place], [place], [address] Phone: [phone]   Fax:  [phone]  Physical Therapy Treatment  Patient Details  Name: Stacy [name] MRN: [id number] Date of Birth: 1960/02/07 Referring Provider (PT): Otelia SergeantNitka   Encounter Date: 09/28/2018  PT End of Session - 09/28/18 0757    Visit Number  2    Number of Visits  4    Date for PT Re-Evaluation  07/25/18    PT Start Time  0800    PT Stop Time  0846    PT Time Calculation (min)  46 min    Activity Tolerance  Patient tolerated treatment well    Behavior During Therapy  [facility] for tasks assessed/performed       Past Medical History:  Diagnosis Date  . Allergy    Shellfish, cleaning products  . Arthritis   . 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 Asthma   . COPD (chronic obstructive pulmonary disease) (HCC)   . Diabetes mellitus    Type II  . GERD (gastroesophageal reflux disease)    Pt on Protonix daily  . Glaucoma   . Gout   . Headache(784.0)    otc meds prn  . Hyperlipidemia   . Hypertension   . Irritable bowel syndrome 11/19/2010  . Neuropathy   . Pneumonia   . Restless legs   . Shortness of breath    07/14/2018- uses  4 times a day    Past Surgical History:  Procedure Laterality Date  . CHOLECYSTECTOMY    . COLONOSCOPY    . ENDOMETRIAL ABLATION  10/2010  . HERNIA REPAIR     umbicial hernia  . KNEE ARTHROSCOPY Left    [facility]07[facility]  . KNEE CLOSED REDUCTION Right 12/06/2015   Procedure: CLOSED MANIPULATION RIGHT KNEE;  Surgeon: Kerrin ChampagneJames E Nitka, MD;  [facility];  Service: Orthopedics;  Laterality: Right;  . KNEE CLOSED REDUCTION Right 01/17/2016   Procedure: CLOSED MANIPULATION RIGHT KNEE;  Surgeon: Kerrin ChampagneJames E Nitka, MD;  Location: [facility] OR;  Service: Orthopedics;  Laterality: Right;  . KNEE JOINT MANIPULATION Right 12/06/2015  . LUMBAR DISC SURGERY  06/03/2015   L 2  L3 L4 L5   .  LUMBAR LAMINECTOMY/DECOMPRESSION MICRODISCECTOMY N/A 06/03/2015   Procedure: Bilateral lateral recess decompression L2-3, L3-4, L4-5;  Surgeon: Kerrin ChampagneJames E Nitka, MD;  [facility];  Service: Orthopedics;  Laterality: N/A;  . LUMBAR LAMINECTOMY/DECOMPRESSION MICRODISCECTOMY N/A 10/02/2016   Procedure: Right L5-S1 Lateral Recess Decompression  microdiscectomy;  Surgeon: Kerrin ChampagneNitka, James E, MD;  [facility];  Service: Orthopedics;  Laterality: N/A;  . LUMBAR LAMINECTOMY/DECOMPRESSION MICRODISCECTOMY N/A 07/15/2018   Procedure: LEFT L3-4 MICRODISCECTOMY;  Surgeon: Kerrin ChampagneNitka, James E, MD;  [facility]: [facility] OR;  Service: Orthopedics;  Laterality: N/A;  . svd      x 2  . TOTAL KNEE ARTHROPLASTY Right 09/06/2015   Procedure: RIGHT TOTAL KNEE ARTHROPLASTY;  Surgeon: Kerrin ChampagneJames E Nitka, MD;  [facility] OR;  Service: Orthopedics;  Laterality: Right;  . TUBAL LIGATION    . UPPER GASTROINTESTINAL ENDOSCOPY  04/28/11    There were no vitals filed for this visit.  Subjective Assessment - 09/28/18 0800    Subjective  Doing much better. Not much pain.    Patient Stated Goals  have less pain and better motions and movements    Currently in Pain?  Yes    Pain Score  6     Pain Location  Back    Pain Orientation  Lower    Pain Descriptors / Indicators  Aching;Tingling;Numbness                       [facility] Adult PT Treatment/Exercise - 09/28/18 0001      Lumbar Exercises: Stretches   Active Hamstring Stretch  Left;5 reps    Active Hamstring Stretch Limitations  nerve glide 5 sec hold/rest     Single Knee to Chest Stretch  Right;Left;2 reps;30 seconds    Double Knee to Chest Stretch  2 reps;30 seconds    Lower Trunk Rotation  4 reps;10 seconds    Gastroc Stretch  Right;2 reps;30 seconds    Gastroc Stretch Limitations  seated with strap      Lumbar Exercises: Aerobic   Nustep  L4 x 5 min      Lumbar Exercises: Supine   Bridge  20 reps    Large Ball Abdominal Isometric  20 reps;3 seconds      Lumbar  Exercises: Sidelying   Hip Abduction  Both;20 reps             PT Education - 09/28/18 1242    Education Details  HEP    Person(s) Educated  Patient    Methods  Explanation;Demonstration;Handout    Comprehension  Verbalized understanding;Returned demonstration       PT Short Term Goals - 09/13/18 0831      PT SHORT TERM GOAL #1   Title  I with initial HEP    Time  2    Period  Weeks    Status  New        PT Long Term Goals - 09/13/18 0831      PT LONG TERM GOAL #1   Title  I with advanced HEP    Time  12    Period  Weeks    Status  New      PT LONG TERM GOAL #2   Title  increase lumbar ROM 25%    Time  12    Period  Weeks    Status  New      PT LONG TERM GOAL #3   Title  decreased pain 50% with ADLs    Time  12    Period  Weeks    Status  New      PT LONG TERM GOAL #4   Title  report able to go up and down stairs without spasms    Time  12    Period  Weeks    Status  New            Plan - 09/28/18 1243    Clinical Impression Statement  Patient did well with strengthening.    PT Frequency  1x / week    PT Duration  12 weeks    PT Treatment/Interventions  ADLs/Self Care Home Management;Cryotherapy;Electrical Stimulation;Moist Heat;Traction;Ultrasound;Functional mobility training;Patient/family education;Therapeutic exercise;Therapeutic activities;Manual techniques;Dry needling;Neuromuscular re-education    PT Next Visit Plan  increase strength and function    PT Home Exercise Plan  [place]       Patient will benefit from skilled therapeutic intervention in order to improve the following deficits and impairments:  Abnormal gait, Decreased range of motion, Difficulty walking, Increased muscle spasms, Decreased endurance, Cardiopulmonary status limiting activity, Decreased activity tolerance, Pain, Improper body mechanics, Impaired flexibility, Decreased strength, Postural dysfunction  Visit Diagnosis: Acute bilateral low back pain with  bilateral sciatica     Problem List Patient Active Problem List   Diagnosis Date Noted  . Chronic pain syndrome 09/08/2018  . Chronic pain of right knee 08/11/2018  . Status post lumbar laminectomy 07/15/2018  . Moderate persistent asthma without complication 06/29/2017  . Environmental and seasonal allergies 06/29/2017  . Controlled type 2 diabetes mellitus with diabetic polyneuropathy, without long-term current use of insulin (HCC) 06/29/2017  . Perennial allergic rhinitis 04/08/2017  . Sensorineural hearing loss (SNHL), bilateral 04/08/2017  . Chronic pansinusitis 03/25/2017  . Eustachian tube dysfunction, bilateral 03/25/2017  . 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 Herniation of lumbar intervertebral disc with radiculopathy 10/02/2016    Class: Chronic  . Alopecia areata 08/19/2016  . Chondromalacia of both patellae 06/03/2015    Class: Chronic  . Spinal stenosis, lumbar region, with neurogenic claudication 06/03/2015  . Tobacco use disorder 04/25/2015  . DJD (degenerative joint disease) of knee 01/04/2015  . Hemorrhoid 11/14/2014  . Gout of big toe 07/19/2014  . Essential hypertension 08/14/2013  . Gastroesophageal reflux disease without esophagitis 08/14/2013  . COPD (chronic obstructive pulmonary disease) (HCC) 04/17/2011    Solon Palm PT 09/28/2018, 12:46 PM  [facility] [address] [place], [place], [address] Phone: [phone]   Fax:  [phone]  Name: Stacy [name] [facility] MRN: [id number] Date of Birth: [birth year]

## 2018-09-28 NOTE — Patient Instructions (Signed)
Access Code: [email or web address]  URL: [email or web address]  Date: 09/28/2018  Prepared by: Madelyn Flavors   Exercises Supine Bridge - 10 reps - 1-2 sets - 2-3 sec hold - 2x daily - 7x weekly Sidelying Hip Abduction - 10 reps - 3 sets - 1x daily - 7x weekly Hooklying Single Knee to Chest - 3 reps - 1 sets - 20 hold - 2x daily - 7x weekly Supine Double Knee to Chest - 3 reps - 1 sets - 20 hold - 2x daily - 7x weekly

## 2018-09-29 DIAGNOSIS — F411 Generalized anxiety disorder: Secondary | ICD-10-CM | POA: Diagnosis not present

## 2018-10-04 DIAGNOSIS — J301 Allergic rhinitis due to pollen: Secondary | ICD-10-CM | POA: Diagnosis not present

## 2018-10-04 DIAGNOSIS — J3089 Other allergic rhinitis: Secondary | ICD-10-CM | POA: Diagnosis not present

## 2018-10-04 DIAGNOSIS — J3081 Allergic rhinitis due to animal (cat) (dog) hair and dander: Secondary | ICD-10-CM | POA: Diagnosis not present

## 2018-10-05 ENCOUNTER — Other Ambulatory Visit: Payer: Self-pay

## 2018-10-05 ENCOUNTER — Ambulatory Visit: Payer: Medicaid Other | Attending: Specialist | Admitting: Physical Therapy

## 2018-10-05 ENCOUNTER — Encounter: Payer: Self-pay | Admitting: Physical Therapy

## 2018-10-05 DIAGNOSIS — R262 Difficulty in walking, not elsewhere classified: Secondary | ICD-10-CM

## 2018-10-05 DIAGNOSIS — M5441 Lumbago with sciatica, right side: Secondary | ICD-10-CM | POA: Insufficient documentation

## 2018-10-05 DIAGNOSIS — M5442 Lumbago with sciatica, left side: Secondary | ICD-10-CM | POA: Insufficient documentation

## 2018-10-05 DIAGNOSIS — M62830 Muscle spasm of back: Secondary | ICD-10-CM | POA: Insufficient documentation

## 2018-10-05 NOTE — Therapy (Signed)
[facility] [address] [place], [place], [address] Phone: [phone]   Fax:  [phone]  Physical Therapy Treatment  Patient Details  Name: Stacy [name] MRN: [id number] Date of Birth: 01-05-1961 Referring Provider (PT): Otelia SergeantNitka   Encounter Date: 10/05/2018  PT End of Session - 10/05/18 0819    Visit Number  3    Number of Visits  4    Date for PT Re-Evaluation  10/27/18    Authorization Type  Medicaid    PT Start Time  0744    PT Stop Time  0830    PT Time Calculation (min)  46 min    Activity Tolerance  Patient tolerated treatment well    Behavior During Therapy  [facility] for tasks assessed/performed       Past Medical History:  Diagnosis Date  . Allergy    Shellfish, cleaning products  . Arthritis   . 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 Asthma   . COPD (chronic obstructive pulmonary disease) (HCC)   . Diabetes mellitus    Type II  . GERD (gastroesophageal reflux disease)    Pt on Protonix daily  . Glaucoma   . Gout   . Headache(784.0)    otc meds prn  . Hyperlipidemia   . Hypertension   . Irritable bowel syndrome 11/19/2010  . Neuropathy   . Pneumonia   . Restless legs   . Shortness of breath    07/14/2018- uses  4 times a day    Past Surgical History:  Procedure Laterality Date  . CHOLECYSTECTOMY    . COLONOSCOPY    . ENDOMETRIAL ABLATION  10/2010  . HERNIA REPAIR     umbicial hernia  . KNEE ARTHROSCOPY Left    06/07/2017 [facility]  . KNEE CLOSED REDUCTION Right 12/06/2015   Procedure: CLOSED MANIPULATION RIGHT KNEE;  Surgeon: Kerrin ChampagneJames E Nitka, MD;  Location: [facility] OR;  Service: Orthopedics;  Laterality: Right;  . KNEE CLOSED REDUCTION Right 01/17/2016   Procedure: CLOSED MANIPULATION RIGHT KNEE;  Surgeon: Kerrin ChampagneJames E Nitka, MD;  Location: [facility] OR;  Service: Orthopedics;  Laterality: Right;  . KNEE JOINT MANIPULATION Right 12/06/2015  . LUMBAR DISC SURGERY   06/03/2015   L 2  L3 L4 L5   . LUMBAR LAMINECTOMY/DECOMPRESSION MICRODISCECTOMY N/A 06/03/2015   Procedure: Bilateral lateral recess decompression L2-3, L3-4, L4-5;  Surgeon: Kerrin ChampagneJames E Nitka, MD;  Location: [facility] OR;  Service: Orthopedics;  Laterality: N/A;  . LUMBAR LAMINECTOMY/DECOMPRESSION MICRODISCECTOMY N/A 10/02/2016   Procedure: Right L5-S1 Lateral Recess Decompression  microdiscectomy;  Surgeon: Kerrin ChampagneNitka, James E, MD;  [facility];  Service: Orthopedics;  Laterality: N/A;  . LUMBAR LAMINECTOMY/DECOMPRESSION MICRODISCECTOMY N/A 07/15/2018   Procedure: LEFT L3-4 MICRODISCECTOMY;  Surgeon: Kerrin ChampagneNitka, James E, MD;  Location: [facility] OR;  Service: Orthopedics;  Laterality: N/A;  . svd      x 2  . TOTAL KNEE ARTHROPLASTY Right 09/06/2015   Procedure: RIGHT TOTAL KNEE ARTHROPLASTY;  Surgeon: Kerrin ChampagneJames E Nitka, MD;  Location: [facility] OR;  Service: Orthopedics;  Laterality: Right;  . TUBAL LIGATION    . UPPER GASTROINTESTINAL ENDOSCOPY  04/28/11    There were no vitals filed for this visit.  Subjective Assessment - 10/05/18 0749    Subjective  Patient reports that she was very sore after the last treatment    Currently in Pain?  Yes    Pain Score  6  Pain Location  Back    Pain Orientation  Lower    Pain Radiating Towards  c/o some pain in the right posterior knee                       [facility] Adult PT Treatment/Exercise - 10/05/18 0001      Lumbar Exercises: Stretches   Passive Hamstring Stretch  Right;Left;3 reps;20 seconds    Piriformis Stretch  Right;Left;4 reps;20 seconds      Lumbar Exercises: Aerobic   Nustep  L3 x 6 min      Lumbar Exercises: Standing   Row  Strengthening;20 reps;Theraband;Both    Theraband Level (Row)  Level 2 (Red)    Shoulder Extension  Theraband;Power Tower;20 reps;Strengthening    Theraband Level (Shoulder Extension)  Level 2 (Red)    Other Standing Lumbar Exercises  using walker marching, hip extension and abduction      Lumbar Exercises: Seated   Long  Arc Quad on Chair  Right;2 sets;10 reps    Other Seated Lumbar Exercises  physio ball reach up and then side to side    Other Seated Lumbar Exercises  physioball in lap abdominal contraction      Lumbar Exercises: Supine   Pelvic Tilt  10 reps;5 seconds    Bridge  20 reps    Other Supine Lumbar Exercises  LE on ball bridges, K2C, Oblq. and isometric abdominals             PT Education - 10/05/18 0817    Education Details  Patient continues to come up with reasons that she can't do some of the exercises, "not enough room, nothing to anchor the band to"  Went over how to solve these issues    Person(s) Educated  Patient    Methods  Explanation;Demonstration    Comprehension  Verbalized understanding       PT Short Term Goals - 09/13/18 0831      PT SHORT TERM GOAL #1   Title  I with initial HEP    Time  2    Period  Weeks    Status  New        PT Long Term Goals - 10/05/18 2355      PT LONG TERM GOAL #1   Title  I with advanced HEP    Status  On-going      PT LONG TERM GOAL #2   Title  increase lumbar ROM 25%    Status  On-going      PT LONG TERM GOAL #3   Title  decreased pain 50% with ADLs    Status  On-going      PT LONG TERM GOAL #4   Title  report able to go up and down stairs without spasms    Status  On-going            Plan - 10/05/18 0820    Clinical Impression Statement  Patient had much c/o increased pain after the previous PT session, she did seem to tolerate the exercises today well but I did back off of some things and put ice on her at the end of the session.  She does have many reasons that she does not do exercises at home, I tried to problem solve with her and show her various ways to do the things that she said she could not do at home., she seemed to understand as it seems that she was trying to replicate  exactly, firm surface to sit on, afraid to pull on some things with the tband etc...    PT Next Visit Plan  continue with current  plan , will have to ask for more visits if we are to continue    Consulted and Agree with Plan of Care  Patient       Patient will benefit from skilled therapeutic intervention in order to improve the following deficits and impairments:  Abnormal gait, Decreased range of motion, Difficulty walking, Increased muscle spasms, Decreased endurance, Cardiopulmonary status limiting activity, Decreased activity tolerance, Pain, Improper body mechanics, Impaired flexibility, Decreased strength, Postural dysfunction  Visit Diagnosis: Acute bilateral low back pain with bilateral sciatica  Muscle spasm of back  Difficulty in walking, not elsewhere classified     Problem List Patient Active Problem List   Diagnosis Date Noted  . Chronic pain syndrome 09/08/2018  . Chronic pain of right knee 08/11/2018  . Status post lumbar laminectomy 07/15/2018  . Moderate persistent asthma without complication 06/29/2017  . Environmental and seasonal allergies 06/29/2017  . Controlled type 2 diabetes mellitus with diabetic polyneuropathy, without long-term current use of insulin (HCC) 06/29/2017  . Perennial allergic rhinitis 04/08/2017  . Sensorineural hearing loss (SNHL), bilateral 04/08/2017  . Chronic pansinusitis 03/25/2017  . Eustachian tube dysfunction, bilateral 03/25/2017  . 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 Herniation of lumbar intervertebral disc with radiculopathy 10/02/2016    Class: Chronic  . Alopecia areata 08/19/2016  . Chondromalacia of both patellae 06/03/2015    Class: Chronic  . Spinal stenosis, lumbar region, with neurogenic claudication 06/03/2015  . Tobacco use disorder 04/25/2015  . DJD (degenerative joint disease) of knee 01/04/2015  . Hemorrhoid 11/14/2014  . Gout of big toe 07/19/2014  . Essential hypertension 08/14/2013  . Gastroesophageal reflux disease without esophagitis 08/14/2013  . COPD (chronic obstructive pulmonary disease) (HCC) 04/17/2011    Jearld Lesch., PT 10/05/2018, 8:27 AM  [facility] [address] [place] [address] [place], [place], [address] Phone: [phone]   Fax:  [phone]  Name: Stacy [name] [facility] MRN: [id number] Date of Birth: 21-Aug-1960

## 2018-10-06 DIAGNOSIS — F411 Generalized anxiety disorder: Secondary | ICD-10-CM | POA: Diagnosis not present

## 2018-10-07 ENCOUNTER — Other Ambulatory Visit: Payer: Self-pay | Admitting: Internal Medicine

## 2018-10-07 DIAGNOSIS — Z1231 Encounter for screening mammogram for malignant neoplasm of breast: Secondary | ICD-10-CM

## 2018-10-07 DIAGNOSIS — H04223 Epiphora due to insufficient drainage, bilateral lacrimal glands: Secondary | ICD-10-CM | POA: Diagnosis not present

## 2018-10-07 DIAGNOSIS — H04559 Acquired stenosis of unspecified nasolacrimal duct: Secondary | ICD-10-CM | POA: Diagnosis not present

## 2018-10-11 DIAGNOSIS — J3089 Other allergic rhinitis: Secondary | ICD-10-CM | POA: Diagnosis not present

## 2018-10-11 DIAGNOSIS — J301 Allergic rhinitis due to pollen: Secondary | ICD-10-CM | POA: Diagnosis not present

## 2018-10-11 DIAGNOSIS — J3081 Allergic rhinitis due to animal (cat) (dog) hair and dander: Secondary | ICD-10-CM | POA: Diagnosis not present

## 2018-10-12 ENCOUNTER — Ambulatory Visit (INDEPENDENT_AMBULATORY_CARE_PROVIDER_SITE_OTHER): Payer: Medicaid Other | Admitting: Surgery

## 2018-10-12 ENCOUNTER — Encounter: Payer: Self-pay | Admitting: Surgery

## 2018-10-12 ENCOUNTER — Other Ambulatory Visit: Payer: Self-pay

## 2018-10-12 VITALS — Ht 66.0 in | Wt 182.0 lb

## 2018-10-12 DIAGNOSIS — Z9889 Other specified postprocedural states: Secondary | ICD-10-CM

## 2018-10-12 DIAGNOSIS — M1712 Unilateral primary osteoarthritis, left knee: Secondary | ICD-10-CM

## 2018-10-12 NOTE — Progress Notes (Signed)
Office Visit Note   Patient: Stacy [name]           Date of Birth: 1960/05/06           MRN: [id number] Visit Date: 10/12/2018              Requested by: Marcine Matar, MD [address] [place],  [place] [address] PCP: Marcine Matar, MD   Assessment & Plan: Visit Diagnoses:  1. Arthritis of left knee   2. S/P lumbar discectomy     Plan: Patient is recovering very well from her L3-4 microdiscectomy.  Regards to her left knee pain I did offer injection there.  Patient consent left knee was prepped with Betadine and intra-articular Marcaine/Depo-Medrol injection was performed.  Noticing for a few minutes patient did report good relief of her pain with Marcaine in place.  Follow-up in the clinic in 3 months for recheck.  If knee continues to be an issue she will return and we will discuss next course of treatment.  Follow-Up Instructions: Return in about 3 months (around 01/11/2019).   Orders:  No orders of the defined types were placed in this encounter.  No orders of the defined types were placed in this encounter.     Procedures: Large Joint Inj: L knee on 10/12/2018 9:27 AM Indications: pain and joint swelling Details: 25 G 1.5 in needle, anteromedial approach Medications: 6 mL bupivacaine 0.25 %; 40 mg methylPREDNISolone acetate 40 MG/ML; 3 mL lidocaine 1 % Consent was given by the patient. Patient was prepped and draped in the usual sterile fashion.       Clinical Data: No additional findings.   Subjective: Chief Complaint  Patient presents with  . Lower Back - Routine Post Op    HPI 58 year old female is 12 weeks status post left L3-4 microdiscectomy returns.  States that she is doing well and she is pleased with her surgical result.  She does complain of left knee pain.  This is been more bothersome over the last couple of months.  No injury.  Knee pain with ambulation, squatting, getting up from a seated position.  Has had some swelling.  No  mechanical symptoms or feeling of instability.  Does complain of some patellofemoral popping.  She has had right total knee replacement August 2017 and then went on to have a manipulation under anesthesia x2.   Review of Systems No current cardiopulmonary GI GU issues  Objective: Vital Signs: Ht 5\' 6"  (1.676 m)   Wt 182 lb (82.6 kg)   BMI 29.38 kg/m   Physical Exam HENT:     Head: Normocephalic and atraumatic.  Eyes:     Extraocular Movements: Extraocular movements intact.     Pupils: Pupils are equal, round, and reactive to light.  Musculoskeletal:     Comments: Gait is antalgic.  Lumbar wound has healed nicely.  No signs of infection.  Negative logroll bilateral hips.  Negative straight leg raise.  Left knee shows good range of motion.  Positive patellofemoral crepitus.  Positive patellar grind.  Medial lateral joint line tenderness.  Negative McMurray's test.  Cruciate collateral ligaments are stable.  She does have some knee swelling without large effusion.  Bilateral calves nontender.  Neurovascular intact.  Neurological:     General: No focal deficit present.     Mental Status: She is alert and oriented to person, place, and time.  Psychiatric:        Mood and Affect: Mood normal.  Ortho Exam  Specialty Comments:  No specialty comments available.  Imaging: No results found.   PMFS History: Patient Active Problem List   Diagnosis Date Noted  . Chronic pain syndrome 09/08/2018  . Chronic pain of right knee 08/11/2018  . Status post lumbar laminectomy 07/15/2018  . Moderate persistent asthma without complication 06/29/2017  . Environmental and seasonal allergies 06/29/2017  . Controlled type 2 diabetes mellitus with diabetic polyneuropathy, without long-term current use of insulin (HCC) 06/29/2017  . Perennial allergic rhinitis 04/08/2017  . Sensorineural hearing loss (SNHL), bilateral 04/08/2017  . Chronic pansinusitis 03/25/2017  . Eustachian tube dysfunction,  bilateral 03/25/2017  . 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 Herniation of lumbar intervertebral disc with radiculopathy 10/02/2016    Class: Chronic  . Alopecia areata 08/19/2016  . Chondromalacia of both patellae 06/03/2015    Class: Chronic  . Spinal stenosis, lumbar region, with neurogenic claudication 06/03/2015  . Tobacco use disorder 04/25/2015  . DJD (degenerative joint disease) of knee 01/04/2015  . Hemorrhoid 11/14/2014  . Gout of big toe 07/19/2014  . Essential hypertension 08/14/2013  . Gastroesophageal reflux disease without esophagitis 08/14/2013  . COPD (chronic obstructive pulmonary disease) (HCC) 04/17/2011   Past Medical History:  Diagnosis Date  . Allergy    Shellfish, cleaning products  . Arthritis   . 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 Asthma   . COPD (chronic obstructive pulmonary disease) (HCC)   . Diabetes mellitus    Type II  . GERD (gastroesophageal reflux disease)    Pt on Protonix daily  . Glaucoma   . Gout   . Headache(784.0)    otc meds prn  . Hyperlipidemia   . Hypertension   . Irritable bowel syndrome 11/19/2010  . Neuropathy   . Pneumonia   . Restless legs   . Shortness of breath    07/14/2018- uses  4 times a day    Family History  Problem Relation Age of Onset  . Hypertension Father   . Cancer Father   . Heart disease Mother   . Asthma Son        had as a child  . Heart disease Sister   . Breast cancer Sister   . Hypertension Brother     Past Surgical History:  Procedure Laterality Date  . CHOLECYSTECTOMY    . COLONOSCOPY    . ENDOMETRIAL ABLATION  10/2010  . HERNIA REPAIR     umbicial hernia  . KNEE ARTHROSCOPY Left    06[facility]07/2017 [facility]  . KNEE CLOSED REDUCTION Right 12/06/2015   Procedure: CLOSED MANIPULATION RIGHT KNEE;  Surgeon: Kerrin ChampagneJames E Nitka, MD;  [facility]: [facility];  Service: Orthopedics;  Laterality: Right;  . KNEE CLOSED REDUCTION Right 01/17/2016   Procedure: CLOSED  MANIPULATION RIGHT KNEE;  Surgeon: Kerrin ChampagneJames E Nitka, MD;  Location: [facility] OR;  Service: Orthopedics;  Laterality: Right;  . KNEE JOINT MANIPULATION Right 12/06/2015  . LUMBAR DISC SURGERY  06/03/2015   L 2  L3 L4 L5   . LUMBAR LAMINECTOMY/DECOMPRESSION MICRODISCECTOMY N/A 06/03/2015   Procedure: Bilateral lateral recess decompression L2-3, L3-4, L4-5;  Surgeon: Kerrin ChampagneJames E Nitka, MD;  Location: [facility];  Service: Orthopedics;  Laterality: N/A;  . LUMBAR LAMINECTOMY/DECOMPRESSION MICRODISCECTOMY N/A 10/02/2016   Procedure: Right L5-S1 Lateral Recess Decompression  microdiscectomy;  Surgeon: Kerrin ChampagneNitka, Areeba Sulser E, MD;  [facility];  Service: Orthopedics;  Laterality: N/A;  . LUMBAR LAMINECTOMY/DECOMPRESSION MICRODISCECTOMY N/A 07/15/2018   Procedure:  LEFT L3-4 MICRODISCECTOMY;  Surgeon: [name], MD;  Location: [place];  Service: Orthopedics;  Laterality: N/A;  . svd      x 2  . TOTAL KNEE ARTHROPLASTY Right 09/06/2015   Procedure: RIGHT TOTAL KNEE ARTHROPLASTY;  Surgeon: [name], MD;  Location: [place];  Service: Orthopedics;  Laterality: Right;  . TUBAL LIGATION    . UPPER GASTROINTESTINAL ENDOSCOPY  04/28/11   Social History   Occupational History  . Occupation: unemployed    Fish farm manager: UNEMPLOYED  Tobacco Use  . Smoking status: Current Some Day Smoker    Packs/day: 0.25    Years: 32.00    Pack years: 8.00    Types: Cigarettes  . Smokeless tobacco: Never Used  Substance and Sexual Activity  . Alcohol use: No  . Drug use: No  . Sexual activity: Yes    Birth control/protection: Surgical

## 2018-10-13 ENCOUNTER — Telehealth: Payer: Self-pay | Admitting: Internal Medicine

## 2018-10-13 DIAGNOSIS — F411 Generalized anxiety disorder: Secondary | ICD-10-CM | POA: Diagnosis not present

## 2018-10-13 NOTE — Telephone Encounter (Signed)
New Message   Pt called wanting to know if she can get a PAP when she comes for her appt on 10/22 and to also check on a chest xray thats suppose to be scheduled. Please f/u

## 2018-10-14 ENCOUNTER — Other Ambulatory Visit: Payer: Self-pay

## 2018-10-14 ENCOUNTER — Ambulatory Visit: Payer: Medicaid Other | Attending: Internal Medicine | Admitting: Internal Medicine

## 2018-10-14 ENCOUNTER — Encounter: Payer: Self-pay | Admitting: Internal Medicine

## 2018-10-14 VITALS — BP 107/70 | HR 78 | Temp 98.6°F | Resp 16 | Wt 182.6 lb

## 2018-10-14 DIAGNOSIS — Z7984 Long term (current) use of oral hypoglycemic drugs: Secondary | ICD-10-CM | POA: Insufficient documentation

## 2018-10-14 DIAGNOSIS — M109 Gout, unspecified: Secondary | ICD-10-CM | POA: Insufficient documentation

## 2018-10-14 DIAGNOSIS — F17210 Nicotine dependence, cigarettes, uncomplicated: Secondary | ICD-10-CM | POA: Insufficient documentation

## 2018-10-14 DIAGNOSIS — J4540 Moderate persistent asthma, uncomplicated: Secondary | ICD-10-CM | POA: Diagnosis not present

## 2018-10-14 DIAGNOSIS — I10 Essential (primary) hypertension: Secondary | ICD-10-CM | POA: Diagnosis not present

## 2018-10-14 DIAGNOSIS — M170 Bilateral primary osteoarthritis of knee: Secondary | ICD-10-CM | POA: Diagnosis not present

## 2018-10-14 DIAGNOSIS — J449 Chronic obstructive pulmonary disease, unspecified: Secondary | ICD-10-CM | POA: Diagnosis not present

## 2018-10-14 DIAGNOSIS — E119 Type 2 diabetes mellitus without complications: Secondary | ICD-10-CM | POA: Insufficient documentation

## 2018-10-14 DIAGNOSIS — H903 Sensorineural hearing loss, bilateral: Secondary | ICD-10-CM | POA: Diagnosis not present

## 2018-10-14 DIAGNOSIS — G2581 Restless legs syndrome: Secondary | ICD-10-CM | POA: Diagnosis not present

## 2018-10-14 DIAGNOSIS — M48062 Spinal stenosis, lumbar region with neurogenic claudication: Secondary | ICD-10-CM | POA: Diagnosis not present

## 2018-10-14 DIAGNOSIS — G894 Chronic pain syndrome: Secondary | ICD-10-CM | POA: Diagnosis not present

## 2018-10-14 DIAGNOSIS — Z79899 Other long term (current) drug therapy: Secondary | ICD-10-CM | POA: Insufficient documentation

## 2018-10-14 DIAGNOSIS — M5116 Intervertebral disc disorders with radiculopathy, lumbar region: Secondary | ICD-10-CM | POA: Diagnosis not present

## 2018-10-14 DIAGNOSIS — D509 Iron deficiency anemia, unspecified: Secondary | ICD-10-CM | POA: Diagnosis not present

## 2018-10-14 DIAGNOSIS — K219 Gastro-esophageal reflux disease without esophagitis: Secondary | ICD-10-CM | POA: Diagnosis not present

## 2018-10-14 DIAGNOSIS — Z01818 Encounter for other preprocedural examination: Secondary | ICD-10-CM | POA: Insufficient documentation

## 2018-10-14 LAB — GLUCOSE, POCT (MANUAL RESULT ENTRY): POC Glucose: 176 mg/dl — AB (ref 70–99)

## 2018-10-14 LAB — POCT GLYCOSYLATED HEMOGLOBIN (HGB A1C): HbA1c, POC (controlled diabetic range): 7.2 % — AB (ref 0.0–7.0)

## 2018-10-14 MED ORDER — LIDOCAINE HCL 1 % IJ SOLN
3.0000 mL | INTRAMUSCULAR | Status: AC | PRN
  Administered 2018-10-12: 3 mL

## 2018-10-14 MED ORDER — METHYLPREDNISOLONE ACETATE 40 MG/ML IJ SUSP
40.0000 mg | INTRAMUSCULAR | Status: AC | PRN
  Administered 2018-10-12: 40 mg via INTRA_ARTICULAR

## 2018-10-14 MED ORDER — BUPIVACAINE HCL 0.25 % IJ SOLN
6.0000 mL | INTRAMUSCULAR | Status: AC | PRN
  Administered 2018-10-12: 09:00:00 6 mL via INTRA_ARTICULAR

## 2018-10-14 NOTE — Progress Notes (Signed)
Patient ID: Stacy [name], female    DOB: 07-20-1960  MRN: [id number]  CC: surgical clearance   Subjective: Stacy [name] is a 58 y.o. female who presents for pre-op eval Her concerns today include:  Pt with hx of HTN, DM with neuropathy,tob dep,HL,PAD, spinal stenosis with neurogenic claudication (s/p laminectomy 07/2018),COPD, RLS,OA knees,gout, anemia, recurrent sinusitis, receiving allergy shots from Dr. Faith Rogue  Will have surgery to unblock ducts around the eyes.  Will be done under general anesthesia.  Surgery to be done by Dr. Gevena Cotton of [facility]  He has sent a request for her to have chest x-ray, EKG and CBC with differential done as part of her preoperative evaluation.  Patient had chest x-ray done in June prior to her back surgery that revealed no acute disease. -She denies any problems waking up from general anesthesia in the past. -She is limited in her ability to walk due to PAD.  She tells me that she tries to walk several days a week using her Rollator walker about 3 blocks but she has to stop at each block and rest for several minutes due to pain in the legs.  Denies any chest pains with ambulation.   COPD:  Using Symbicort and Spiriva as prescribed.  Uses neb 1-2 X QOD.   No recent fever or inc cough, SOB or wheezing.  Still working on trying to quit smoking.  DM: checking BS  BID.  BS in mornings 93-110 and at nights in 130s.  No lows Not eating as much Drinking mainly water.  Eating a lot more fruits and vegetables and less fried foods.  She tells me also that she found herself recently eating a lot of ice.  Reports compliance with Amaryl.  HTN: Compliant with Diovan/HCTZ, HCTZ, and low-salt diet.  No lower extremity edema.  No chronic headaches or dizziness.  Patient Active Problem List   Diagnosis Date Noted  . Chronic pain syndrome 09/08/2018  . Chronic pain of right knee 08/11/2018  . Status post lumbar laminectomy 07/15/2018   . Moderate persistent asthma without complication 57/26/2035  . Environmental and seasonal allergies 06/29/2017  . Controlled type 2 diabetes mellitus with diabetic polyneuropathy, without long-term current use of insulin ([place]) 06/29/2017  . Perennial allergic rhinitis 04/08/2017  . Sensorineural hearing loss (SNHL), bilateral 04/08/2017  . Chronic pansinusitis 03/25/2017  . Eustachian tube dysfunction, bilateral 03/25/2017  . Lichen planopilaris 59/74/1638  . Herniation of lumbar intervertebral disc with radiculopathy 10/02/2016    Class: Chronic  . Alopecia areata 08/19/2016  . Chondromalacia of both patellae 06/03/2015    Class: Chronic  . Spinal stenosis, lumbar region, with neurogenic claudication 06/03/2015  . Tobacco use disorder 04/25/2015  . DJD (degenerative joint disease) of knee 01/04/2015  . Hemorrhoid 11/14/2014  . Gout of big toe 07/19/2014  . Essential hypertension 08/14/2013  . Gastroesophageal reflux disease without esophagitis 08/14/2013  . COPD (chronic obstructive pulmonary disease) ([place]) 04/17/2011     Current Outpatient Medications on File Prior to Visit  Medication Sig Dispense Refill  . ACCU-CHEK SOFTCLIX LANCETS lancets USE AS INSTRUCTED 3 TIMES A DAY 100 each 12  . albuterol (PROVENTIL) (2.5 MG/3ML) 0.083% nebulizer solution Take 3 mLs (2.5 mg total) by nebulization every 6 (six) hours as needed for wheezing. 100 vial 6  . allopurinol (ZYLOPRIM) 100 MG tablet TAKE 1 TABLET (100 MG TOTAL) BY MOUTH DAILY. 90 tablet 3  . Blood Glucose Monitoring Suppl (ACCU-CHEK AVIVA PLUS) w/Device KIT  1 each by Does not apply route 3 (three) times daily. 1 kit 0  . budesonide-formoterol (SYMBICORT) 80-4.5 MCG/ACT inhaler INHALE TWO PUFFS BY MOUTH TWICE A DAY (Patient taking differently: Inhale 2 puffs into the lungs 2 (two) times a day. ) 1 Inhaler 11  . celecoxib (CELEBREX) 200 MG capsule Take 1 capsule (200 mg total) by mouth 2 (two) times daily. 180 capsule 3  .  clopidogrel (PLAVIX) 75 MG tablet TAKE 1 TABLET (75 MG TOTAL) BY MOUTH DAILY. 30 tablet 3  . cromolyn (OPTICROM) 4 % ophthalmic solution Place 1 drop into both eyes 4 (four) times daily.     . diclofenac sodium (VOLTAREN) 1 % GEL Apply 4 g topically 4 (four) times daily. 5 Tube 6  . docusate sodium (COLACE) 100 MG capsule Take 1 capsule (100 mg total) by mouth 2 (two) times daily. 10 capsule 0  . EPINEPHrine 0.3 mg/0.3 mL IJ SOAJ injection 0.3 mg IM x 1 PRN for allergic reaction (Patient taking differently: Inject 0.3 mg into the muscle as needed for anaphylaxis. ) 1 Device 1  . fluticasone (FLONASE) 50 MCG/ACT nasal spray Place 1 spray into both nostrils daily. 16 g 6  . gabapentin (NEURONTIN) 600 MG tablet Take 1 tablet (600 mg total) by mouth 3 (three) times daily. 90 tablet 1  . glimepiride (AMARYL) 2 MG tablet Take 1 tablet (2 mg total) by mouth daily. 30 tablet 6  . glucose blood (ACCU-CHEK AVIVA PLUS) test strip USE 3 TIMES A DAY AS DIRECTED BY PHYSICIAN 100 each 12  . hydrochlorothiazide (HYDRODIURIL) 25 MG tablet Take 1 tablet (25 mg total) by mouth daily. 90 tablet 3  . HYDROcodone-acetaminophen (NORCO) 7.5-325 MG tablet Take 1 tablet by mouth every 6 (six) hours as needed for moderate pain. 50 tablet 0  . hydrOXYzine (ATARAX/VISTARIL) 10 MG tablet TAKE 1 TABLET IN THE MORNING AND NOON AND 2 TABLETS IN THE EVENING AS NEEDED 120 tablet 2  . Lancets (ACCU-CHEK SOFT TOUCH) lancets Use as instructed 100 each 12  . loratadine (CLARITIN) 10 MG tablet Take 1 tablet (10 mg total) by mouth daily. 30 tablet 11  . Melatonin 3 MG TABS Take 1 tablet (3 mg total) by mouth at bedtime as needed. (Patient taking differently: Take 3 mg by mouth at bedtime as needed (sleep). ) 30 tablet 3  . methocarbamol (ROBAXIN) 500 MG tablet Take 1 tablet (500 mg total) by mouth every 8 (eight) hours as needed for muscle spasms. 60 tablet 3  . methocarbamol (ROBAXIN) 500 MG tablet Take 1 tablet (500 mg total) by mouth  every 8 (eight) hours as needed for muscle spasms. 40 tablet 1  . methocarbamol (ROBAXIN) 500 MG tablet 1 to 2 tablets by mouth every 8 hrs as needed. 40 tablet 0  . MITIGARE 0.6 MG CAPS Take 1 capsule by mouth daily. 30 capsule 3  . montelukast (SINGULAIR) 10 MG tablet Take 10 mg by mouth at bedtime.     . Multiple Vitamin (MULTIVITAMIN WITH MINERALS) TABS tablet Take 1 tablet by mouth daily.    . nicotine (NICODERM CQ - DOSED IN MG/24 HOURS) 14 mg/24hr patch PLACE 1 PATCH ONTO THE SKIN DAILY 28 patch 2  . oxyCODONE-acetaminophen (PERCOCET/ROXICET) 5-325 MG tablet Take 1 tablet by mouth every 6 (six) hours as needed for severe pain. 40 tablet 0  . pantoprazole (PROTONIX) 40 MG tablet Take 1 tablet (40 mg total) by mouth daily. 30 tablet 6  . Potassium Chloride ER 20  MEQ TBCR Take 20 mEq by mouth daily. 30 tablet 6  . potassium chloride SA (K-DUR) 20 MEQ tablet TAKE ONE TABLET BY MOUTH ONCE DAILY 30 tablet 0  . promethazine (PHENERGAN) 25 MG tablet Take 1 tablet (25 mg total) by mouth 2 (two) times daily as needed for nausea or vomiting. 20 tablet 0  . Respiratory Therapy Supplies (FLUTTER) DEVI Use after breathing treatment 4 times daily 1 each 0  . simvastatin (ZOCOR) 20 MG tablet Take 20 mg by mouth at bedtime.    . sucralfate (CARAFATE) 1 g tablet Take 1 g by mouth 4 (four) times daily -  with meals and at bedtime.    Marland Kitchen tiotropium (SPIRIVA HANDIHALER) 18 MCG inhalation capsule Place 1 capsule (18 mcg total) into inhaler and inhale daily. 30 capsule 12  . valsartan-hydrochlorothiazide (DIOVAN-HCT) 160-12.5 MG tablet Take 1 tablet by mouth daily. Must keep upcoming appt for refills. (Patient taking differently: Take 1 tablet by mouth daily. ) 30 tablet 6  . [DISCONTINUED] Fluticasone-Salmeterol (ADVAIR) 500-50 MCG/DOSE AEPB Inhale 1 puff into the lungs every 12 (twelve) hours.      . [DISCONTINUED] lisinopril (PRINIVIL,ZESTRIL) 40 MG tablet Take 40 mg by mouth daily.       No current  facility-administered medications on file prior to visit.     Allergies  Allergen Reactions  . Other Shortness Of Breath    UNSPECIFIED AGENTS Allergic to perfumes and cleaning products  . Ace Inhibitors Cough and Other (See Comments)       . Shellfish Allergy Other (See Comments)    Per allergy test.  . Aspirin Nausea Only    Social History   Socioeconomic History  . Marital status: Married    Spouse name: Not on file  . Number of children: 2  . Years of education: Not on file  . Highest education level: Not on file  Occupational History  . Occupation: unemployed    Fish farm manager: UNEMPLOYED  Social Needs  . Financial resource strain: Not on file  . Food insecurity    Worry: Not on file    Inability: Not on file  . Transportation needs    Medical: Not on file    Non-medical: Not on file  Tobacco Use  . Smoking status: Current Some Day Smoker    Packs/day: 0.25    Years: 32.00    Pack years: 8.00    Types: Cigarettes  . Smokeless tobacco: Never Used  Substance and Sexual Activity  . Alcohol use: No  . Drug use: No  . Sexual activity: Yes    Birth control/protection: Surgical  Lifestyle  . Physical activity    Days per week: Not on file    Minutes per session: Not on file  . Stress: Not on file  Relationships  . Social Herbalist on phone: Not on file    Gets together: Not on file    Attends religious service: Not on file    Active member of club or organization: Not on file    Attends meetings of clubs or organizations: Not on file    Relationship status: Not on file  . Intimate partner violence    Fear of current or ex partner: Not on file    Emotionally abused: Not on file    Physically abused: Not on file    Forced sexual activity: Not on file  Other Topics Concern  . Not on file  Social History Narrative  . Not on  file    Family History  Problem Relation Age of Onset  . Hypertension Father   . Cancer Father   . Heart disease Mother    . Asthma Son        had as a child  . Heart disease Sister   . Breast cancer Sister   . Hypertension Brother     Past Surgical History:  Procedure Laterality Date  . CHOLECYSTECTOMY    . COLONOSCOPY    . ENDOMETRIAL ABLATION  10/2010  . HERNIA REPAIR     umbicial hernia  . KNEE ARTHROSCOPY Left    06/07/2017 Dr. Marlou Sa of [facility]  . KNEE CLOSED REDUCTION Right 12/06/2015   Procedure: CLOSED MANIPULATION RIGHT KNEE;  Surgeon: [name], MD;  Location: [place];  Service: Orthopedics;  Laterality: Right;  . KNEE CLOSED REDUCTION Right 01/17/2016   Procedure: CLOSED MANIPULATION RIGHT KNEE;  Surgeon: [name], MD;  Location: [place];  Service: Orthopedics;  Laterality: Right;  . KNEE JOINT MANIPULATION Right 12/06/2015  . LUMBAR DISC SURGERY  06/03/2015   L 2  L3 L4 L5   . LUMBAR LAMINECTOMY/DECOMPRESSION MICRODISCECTOMY N/A 06/03/2015   Procedure: Bilateral lateral recess decompression L2-3, L3-4, L4-5;  Surgeon: [name], MD;  Location: [place];  Service: Orthopedics;  Laterality: N/A;  . LUMBAR LAMINECTOMY/DECOMPRESSION MICRODISCECTOMY N/A 10/02/2016   Procedure: Right L5-S1 Lateral Recess Decompression  microdiscectomy;  Surgeon: [name], MD;  Location: [place];  Service: Orthopedics;  Laterality: N/A;  . LUMBAR LAMINECTOMY/DECOMPRESSION MICRODISCECTOMY N/A 07/15/2018   Procedure: LEFT L3-4 MICRODISCECTOMY;  Surgeon: [name], MD;  Location: [place];  Service: Orthopedics;  Laterality: N/A;  . svd      x 2  . TOTAL KNEE ARTHROPLASTY Right 09/06/2015   Procedure: RIGHT TOTAL KNEE ARTHROPLASTY;  Surgeon: [name], MD;  Location: [place];  Service: Orthopedics;  Laterality: Right;  . TUBAL LIGATION    . UPPER GASTROINTESTINAL ENDOSCOPY  04/28/11    ROS: Review of Systems Negative except as stated above  PHYSICAL EXAM: BP 107/70   Pulse 78   Temp 98.6 F (37 C) (Oral)   Resp 16   Wt 182 lb 9.6 oz (82.8 kg)   SpO2 99%   BMI 29.47 kg/m   Physical Exam   General appearance - alert, well appearing, and in no distress Mental status - normal mood, behavior, speech, dress, motor activity, and thought processes Eyes - pupils equal and reactive, extraocular eye movements intact Nose - normal and patent, no erythema, discharge or polyps Mouth -she is edentulous above.  Throat is clear. Neck - supple, no significant adenopathy Chest - clear to auscultation, no wheezes, rales or rhonchi, symmetric air entry Heart - normal rate, regular rhythm, normal S1, S2, no murmurs, rubs, clicks or gallops Abdomen - soft, nontender, nondistended, no masses or organomegaly Extremities - peripheral pulses normal, no pedal edema, no clubbing or cyanosis   CMP Latest Ref Rng & Units 07/16/2018 07/15/2018 03/18/2018  Glucose 70 - 99 mg/dL 145(H) 150(H) 95  BUN 6 - 20 mg/dL 10 11 14   Creatinine 0.44 - 1.00 mg/dL 0.82 0.81 0.92  Sodium 135 - 145 mmol/L 136 140 135  Potassium 3.5 - 5.1 mmol/L 4.0 3.2(L) 3.6  Chloride 98 - 111 mmol/L 100 103 102  CO2 22 - 32 mmol/L 24 23 24   Calcium 8.9 - 10.3 mg/dL 9.0 9.5 9.7  Total Protein 6.5 - 8.1 g/dL - 7.7 -  Total Bilirubin 0.3 - 1.2 mg/dL - 0.4 -  Alkaline Phos 38 - 126 U/L - 107 -  AST 15 - 41 U/L - 22 -  ALT 0 - 44 U/L - 17 -   Lipid Panel     Component Value Date/Time   CHOL 189 11/11/2017 0949   TRIG 217 (H) 11/11/2017 0949   HDL 47 11/11/2017 0949   CHOLHDL 4.0 11/11/2017 0949   CHOLHDL 4.2 07/19/2014 1202   VLDL 24 07/19/2014 1202   LDLCALC 99 11/11/2017 0949    CBC    Component Value Date/Time   WBC 8.8 07/16/2018 0415   RBC 3.95 07/16/2018 0415   HGB 10.4 (L) 07/16/2018 0415   HGB 12.6 05/26/2018 1037   HCT 31.2 (L) 07/16/2018 0415   HCT 38.3 05/26/2018 1037   PLT 264 07/16/2018 0415   PLT 284 05/26/2018 1037   MCV 79.0 (L) 07/16/2018 0415   MCV 81 05/26/2018 1037   MCH 26.3 07/16/2018 0415   MCHC 33.3 07/16/2018 0415   RDW 15.3 07/16/2018 0415   RDW 16.1 (H) 05/26/2018 1037   LYMPHSABS 3.1  03/18/2018 0252   MONOABS 0.5 03/18/2018 0252   EOSABS 0.1 03/18/2018 0252   BASOSABS 0.0 03/18/2018 0252   Results for orders placed or performed in visit on 10/14/18  POCT glucose (manual entry)  Result Value Ref Range   POC Glucose 176 (A) 70 - 99 mg/dl  POCT glycosylated hemoglobin (Hb A1C)  Result Value Ref Range   Hemoglobin A1C     HbA1c POC (<> result, manual entry)     HbA1c, POC (prediabetic range)     HbA1c, POC (controlled diabetic range) 7.2 (A) 0.0 - 7.0 %  EKG: Normal sinus rhythm with no acute ischemic changes and unchanged from previous EKG.  ASSESSMENT AND PLAN: 1. Preoperative evaluation to rule out surgical contraindication Surgery that she will be having is low risks.  I think she is optimized for the procedure. I did not repeat chest x-ray as chest x-ray done in June was okay. - CBC With Differential  2. Type 2 diabetes mellitus without complication, without long-term current use of insulin ([facility]) A1c improved and close to goal.  She will hold the Amaryl the morning of her surgery and resume after.  Continue healthy eating habits and physical activity as tolerated - POCT glucose (manual entry) - POCT glycosylated hemoglobin (Hb A1C)  3. Essential hypertension At goal.  Continue current medications.  4. Chronic obstructive pulmonary disease, unspecified COPD type ([place]) Currently stable on current medications.  5. Microcytic anemia Advised to stop eating ice.  We will recheck a CBC along with iron studies.  She is up-to-date with colon cancer screening. - Iron, TIBC and Ferritin Panel     Patient was given the opportunity to ask questions.  Patient verbalized understanding of the plan and was able to repeat key elements of the plan.   Orders Placed This Encounter  Procedures  . CBC With Differential  . Iron, TIBC and Ferritin Panel  . POCT glucose (manual entry)  . POCT glycosylated hemoglobin (Hb A1C)     Requested Prescriptions    No  prescriptions requested or ordered in this encounter    No follow-ups on file.  Karle Plumber, MD, FACP

## 2018-10-14 NOTE — Patient Instructions (Signed)
Please hold Plavix at least 7 days prior to your procedure.  You can restart it the day after your procedure as long as the ophthalmologist thinks it is safe for it to be restarted at that time.    You should also hold Celebrex for 5 days prior to your procedure.  Hold off on taking Amaryl the morning of your procedure.  You can restart it after the procedure.

## 2018-10-15 LAB — CBC WITH DIFFERENTIAL
Basophils Absolute: 0 10*3/uL (ref 0.0–0.2)
Basos: 0 %
EOS (ABSOLUTE): 0.1 10*3/uL (ref 0.0–0.4)
Eos: 1 %
Hematocrit: 33.4 % — ABNORMAL LOW (ref 34.0–46.6)
Hemoglobin: 9.9 g/dL — ABNORMAL LOW (ref 11.1–15.9)
Immature Grans (Abs): 0 10*3/uL (ref 0.0–0.1)
Immature Granulocytes: 0 %
Lymphocytes Absolute: 4.1 10*3/uL — ABNORMAL HIGH (ref 0.7–3.1)
Lymphs: 45 %
MCH: 23.3 pg — ABNORMAL LOW (ref 26.6–33.0)
MCHC: 29.6 g/dL — ABNORMAL LOW (ref 31.5–35.7)
MCV: 79 fL (ref 79–97)
Monocytes Absolute: 0.6 10*3/uL (ref 0.1–0.9)
Monocytes: 6 %
Neutrophils Absolute: 4.3 10*3/uL (ref 1.4–7.0)
Neutrophils: 48 %
RBC: 4.25 x10E6/uL (ref 3.77–5.28)
RDW: 15.1 % (ref 11.7–15.4)
WBC: 9 10*3/uL (ref 3.4–10.8)

## 2018-10-15 LAB — IRON,TIBC AND FERRITIN PANEL
Ferritin: 19 ng/mL (ref 15–150)
Iron Saturation: 3 % — CL (ref 15–55)
Iron: 15 ug/dL — ABNORMAL LOW (ref 27–159)
Total Iron Binding Capacity: 467 ug/dL — ABNORMAL HIGH (ref 250–450)
UIBC: 452 ug/dL — ABNORMAL HIGH (ref 131–425)

## 2018-10-16 ENCOUNTER — Encounter: Payer: Self-pay | Admitting: Internal Medicine

## 2018-10-16 ENCOUNTER — Telehealth: Payer: Self-pay | Admitting: Internal Medicine

## 2018-10-16 DIAGNOSIS — D509 Iron deficiency anemia, unspecified: Secondary | ICD-10-CM | POA: Insufficient documentation

## 2018-10-16 MED ORDER — FERROUS SULFATE 325 (65 FE) MG PO TABS: 325.0000 mg | ORAL_TABLET | Freq: Two times a day (BID) | ORAL | 1 refills | Status: DC

## 2018-10-16 MED ORDER — SENNOSIDES-DOCUSATE SODIUM 8.6-50 MG PO TABS: 1.0000 | ORAL_TABLET | Freq: Every day | ORAL | 2 refills | Status: DC | PRN

## 2018-10-16 NOTE — Telephone Encounter (Signed)
PC placed to pt this a.m.  Pt informed that she has iron def anemia.  Anemia is a little worse.  She reports being on iron in the past for anemia.  She is up to date with colon cancer screening.   I recommend restarting iron supplement. Pt told that iron can cause constipation so I will also send rxn for Senokot for her to use as needed.  Pt expressed understanding of plan.

## 2018-10-17 NOTE — Telephone Encounter (Signed)
Pt does have a f/u appointment schedule with luke and pcp on 11/24/18

## 2018-10-17 NOTE — Telephone Encounter (Signed)
Faxed surgical clearnace note and results to [facility]

## 2018-10-18 ENCOUNTER — Other Ambulatory Visit: Payer: Self-pay

## 2018-10-18 ENCOUNTER — Telehealth: Payer: Self-pay | Admitting: Specialist

## 2018-10-18 ENCOUNTER — Ambulatory Visit: Payer: Medicaid Other | Admitting: Physical Therapy

## 2018-10-18 ENCOUNTER — Encounter: Payer: Self-pay | Admitting: Physical Therapy

## 2018-10-18 DIAGNOSIS — R262 Difficulty in walking, not elsewhere classified: Secondary | ICD-10-CM

## 2018-10-18 DIAGNOSIS — M62830 Muscle spasm of back: Secondary | ICD-10-CM

## 2018-10-18 DIAGNOSIS — M5442 Lumbago with sciatica, left side: Secondary | ICD-10-CM

## 2018-10-18 DIAGNOSIS — M5441 Lumbago with sciatica, right side: Secondary | ICD-10-CM | POA: Diagnosis not present

## 2018-10-18 NOTE — Therapy (Signed)
[facility] [address] [place], [place], [address] Phone: [phone]   Fax:  [phone]  Physical Therapy Treatment  Patient Details  Name: Stacy [name] [facility] MRN: [id number] Date of Birth: 11-17-1960 Referring Provider (PT): [name]   Encounter Date: 10/18/2018  PT End of Session - 10/18/18 0839    Visit Number  4    Date for PT Re-Evaluation  10/27/18    Authorization Type  Medicaid    PT Start Time  0755    PT Stop Time  0849    PT Time Calculation (min)  54 min    Activity Tolerance  Patient tolerated treatment well    Behavior During Therapy  [facility] for tasks assessed/performed       Past Medical History:  Diagnosis Date  . Allergy    Shellfish, cleaning products  . Arthritis   . 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 Asthma   . COPD (chronic obstructive pulmonary disease) (HCC)   . Diabetes mellitus    Type II  . GERD (gastroesophageal reflux disease)    Pt on Protonix daily  . Glaucoma   . Gout   . Headache(784.0)    otc meds prn  . Hyperlipidemia   . Hypertension   . Irritable bowel syndrome 11/19/2010  . Neuropathy   . Pneumonia   . Restless legs   . Shortness of breath    07/14/2018- uses  4 times a day    Past Surgical History:  Procedure Laterality Date  . CHOLECYSTECTOMY    . COLONOSCOPY    . ENDOMETRIAL ABLATION  10/2010  . HERNIA REPAIR     umbicial hernia  . KNEE ARTHROSCOPY Left    06/07/2017 Dr. August Saucer of [facility]  . KNEE CLOSED REDUCTION Right 12/06/2015   Procedure: CLOSED MANIPULATION RIGHT KNEE;  Surgeon: Kerrin Champagne, MD;  Location: [facility];  Service: Orthopedics;  Laterality: Right;  . KNEE CLOSED REDUCTION Right 01/17/2016   Procedure: CLOSED MANIPULATION RIGHT KNEE;  Surgeon: Kerrin Champagne, MD;  Location: [facility] OR;  Service: Orthopedics;  Laterality: Right;  . KNEE JOINT MANIPULATION Right 12/06/2015  . LUMBAR DISC SURGERY  06/03/2015   L 2  L3  L4 L5   . LUMBAR LAMINECTOMY/DECOMPRESSION MICRODISCECTOMY N/A 06/03/2015   Procedure: Bilateral lateral recess decompression L2-3, L3-4, L4-5;  Surgeon: Kerrin Champagne, MD;  Location[facility];  Service: Orthopedics;  Laterality: N/A;  . LUMBAR LAMINECTOMY/DECOMPRESSION MICRODISCECTOMY N/A 10/02/2016   Procedure: Right L5-S1 Lateral Recess Decompression  microdiscectomy;  Surgeon: Kerrin Champagne, MD;  Location: [facility] OR;  Service: Orthopedics;  Laterality: N/A;  . LUMBAR LAMINECTOMY/DECOMPRESSION MICRODISCECTOMY N/A 07/15/2018   Procedure: LEFT L3-4 MICRODISCECTOMY;  Surgeon: Kerrin Champagne, MD;  Location[facility];  Service: Orthopedics;  Laterality: N/A;  . svd      x 2  . TOTAL KNEE ARTHROPLASTY Right 09/06/2015   Procedure: RIGHT TOTAL KNEE ARTHROPLASTY;  Surgeon: Kerrin Champagne, MD;  Location[facility];  Service: Orthopedics;  Laterality: Right;  . TUBAL LIGATION    . UPPER GASTROINTESTINAL ENDOSCOPY  04/28/11    There were no vitals filed for this visit.  Subjective Assessment - 10/18/18 0759    Subjective  Felt fine after last treatment. Pretty good today    Pertinent History  multiple back surgeries, TKR's, COPD    Limitations  Lifting;Standing;Walking;House hold activities    Currently in Pain?  Yes  Pain Score  6     Pain Location  Knee    Pain Orientation  Posterior                       [facility] Adult PT Treatment/Exercise - 10/18/18 0001      Lumbar Exercises: Aerobic   Nustep  L3 x 6 min      Lumbar Exercises: Machines for Strengthening   Cybex Knee Flexion  20lb 2x10       Lumbar Exercises: Standing   Row  Strengthening;20 reps;Theraband;Both    Theraband Level (Row)  Level 2 (Red)    Shoulder Extension  Theraband;Power Tower;20 reps;Strengthening    Theraband Level (Shoulder Extension)  Level 2 (Red)    Other Standing Lumbar Exercises  using walker marching, hip extension and abduction      Lumbar Exercises: Seated   Long Arc Quad on Chair  Right;2 sets;10 reps     Other Seated Lumbar Exercises  physio ball reach up       Lumbar Exercises: Supine   Ab Set  10 reps;2 seconds    Bridge  20 reps    Other Supine Lumbar Exercises  LE on ball bridges, K2C, Oblq. and isometric abdominals      Modalities   Modalities  Cryotherapy      Cryotherapy   Number Minutes Cryotherapy  10 Minutes    Cryotherapy Location  Lumbar Spine    Type of Cryotherapy  Ice pack               PT Short Term Goals - 09/13/18 0831      PT SHORT TERM GOAL #1   Title  I with initial HEP    Time  2    Period  Weeks    Status  New        PT Long Term Goals - 10/18/18 0840      PT LONG TERM GOAL #1   Title  I with advanced HEP    Status  On-going      PT LONG TERM GOAL #2   Title  increase lumbar ROM 25%    Status  On-going      PT LONG TERM GOAL #3   Title  decreased pain 50% with ADLs    Status  On-going            Plan - 10/18/18 0840    Clinical Impression Statement  Pt report that she felt fine after previous treatment so kept most interventions the same. Tried to progress to some seated bilateral Le extension but kept reporting a catching pain in the posterior R knee. Discontinued the extension in replace of LAQ, no issues with the LAQ reported. Cue to engage core with OHP and supine ab sets. Continues to report difficulty with HEP in regulars to environment issues.    Personal Factors and Comorbidities  Fitness;Past/Current Experience;Comorbidity 3+;Social Background;Education;Transportation    Comorbidities  Gout, 2 past back surgeries, TKR's, COPD    Examination-Activity Limitations  Bend;Stairs;Squat;Stand;Lift;Transfers;Locomotion Level    Examination-Participation Restrictions  Cleaning;Laundry    Stability/Clinical Decision Making  Evolving/Moderate complexity    Rehab Potential  Good    PT Frequency  1x / week    PT Duration  12 weeks    PT Treatment/Interventions  ADLs/Self Care Home Management;Cryotherapy;Electrical  Stimulation;Moist Heat;Traction;Ultrasound;Functional mobility training;Patient/family education;Therapeutic exercise;Therapeutic activities;Manual techniques;Dry needling;Neuromuscular re-education    PT Next Visit Plan  continue with current plan , try to  get more visits       Patient will benefit from skilled therapeutic intervention in order to improve the following deficits and impairments:  Abnormal gait, Decreased range of motion, Difficulty walking, Increased muscle spasms, Decreased endurance, Cardiopulmonary status limiting activity, Decreased activity tolerance, Pain, Improper body mechanics, Impaired flexibility, Decreased strength, Postural dysfunction  Visit Diagnosis: Muscle spasm of back  Difficulty in walking, not elsewhere classified  Acute bilateral low back pain with bilateral sciatica     Problem List Patient Active Problem List   Diagnosis Date Noted  . Iron deficiency anemia 10/16/2018  . Chronic pain syndrome 09/08/2018  . Chronic pain of right knee 08/11/2018  . Status post lumbar laminectomy 07/15/2018  . Moderate persistent asthma without complication 72/76/1848  . Environmental and seasonal allergies 06/29/2017  . Controlled type 2 diabetes mellitus with diabetic polyneuropathy, without long-term current use of insulin ([place]) 06/29/2017  . Perennial allergic rhinitis 04/08/2017  . Sensorineural hearing loss (SNHL), bilateral 04/08/2017  . Chronic pansinusitis 03/25/2017  . Eustachian tube dysfunction, bilateral 03/25/2017  . Lichen planopilaris 59/27/6394  . Herniation of lumbar intervertebral disc with radiculopathy 10/02/2016    Class: Chronic  . Alopecia areata 08/19/2016  . Chondromalacia of both patellae 06/03/2015    Class: Chronic  . Spinal stenosis, lumbar region, with neurogenic claudication 06/03/2015  . Tobacco use disorder 04/25/2015  . DJD (degenerative joint disease) of knee 01/04/2015  . Hemorrhoid 11/14/2014  . Gout of big toe  07/19/2014  . Essential hypertension 08/14/2013  . Gastroesophageal reflux disease without esophagitis 08/14/2013  . COPD (chronic obstructive pulmonary disease) ([place]) 04/17/2011    Scot Jun, PTA 10/18/2018, 8:44 AM  [address] [place], [place], [address] Phone: [phone]   Fax:  [phone]  Name: Stacy [name] [facility] MRN: [id number] Date of Birth: 12/29/1960

## 2018-10-18 NOTE — Telephone Encounter (Signed)
Patient called back to give the you the fax number to [facility].  It is [phone].  Thank you.

## 2018-10-20 ENCOUNTER — Telehealth: Payer: Self-pay | Admitting: Internal Medicine

## 2018-10-20 DIAGNOSIS — F411 Generalized anxiety disorder: Secondary | ICD-10-CM | POA: Diagnosis not present

## 2018-10-20 DIAGNOSIS — J301 Allergic rhinitis due to pollen: Secondary | ICD-10-CM | POA: Diagnosis not present

## 2018-10-20 DIAGNOSIS — J3081 Allergic rhinitis due to animal (cat) (dog) hair and dander: Secondary | ICD-10-CM | POA: Diagnosis not present

## 2018-10-20 DIAGNOSIS — J3089 Other allergic rhinitis: Secondary | ICD-10-CM | POA: Diagnosis not present

## 2018-10-20 MED ORDER — METHOCARBAMOL 500 MG PO TABS: 500.0000 mg | ORAL_TABLET | Freq: Three times a day (TID) | ORAL | 0 refills | Status: DC | PRN

## 2018-10-20 NOTE — Telephone Encounter (Signed)
Hydroxyzine was sent to [facility] in August. Patient will need to contact them for transfer if she wants to have it filled at [facility]. I will send methocarbamol to [facility].

## 2018-10-20 NOTE — Telephone Encounter (Signed)
New Message   1) Medication(s) Requested (by name): hydrOXYzine (ATARAX/VISTARIL) 10 MG tablet and methocarbamol (ROBAXIN) 500 MG tablet  2) Pharmacy of Choice: [facility]  3) Special Requests:   Approved medications will be sent to the pharmacy, we will reach out if there is an issue.  Requests made after 3pm may not be addressed until the following business day!  If a patient is unsure of the name of the medication(s) please note and ask patient to call back when they are able to provide all info, do not send to responsible party until all information is available!

## 2018-10-24 DIAGNOSIS — J301 Allergic rhinitis due to pollen: Secondary | ICD-10-CM | POA: Diagnosis not present

## 2018-10-24 DIAGNOSIS — J3081 Allergic rhinitis due to animal (cat) (dog) hair and dander: Secondary | ICD-10-CM | POA: Diagnosis not present

## 2018-10-24 DIAGNOSIS — J3089 Other allergic rhinitis: Secondary | ICD-10-CM | POA: Diagnosis not present

## 2018-10-24 NOTE — Telephone Encounter (Signed)
Noted  

## 2018-10-26 ENCOUNTER — Telehealth: Payer: Self-pay | Admitting: Internal Medicine

## 2018-10-26 NOTE — Telephone Encounter (Signed)
EKG has been faxed to [facility] eye care

## 2018-10-26 NOTE — Telephone Encounter (Signed)
Patient called stating that she needs her EKG results faxed over to [facility] eye center for her surgery.  Please follow up.   Fax# [phone]

## 2018-10-27 ENCOUNTER — Ambulatory Visit (INDEPENDENT_AMBULATORY_CARE_PROVIDER_SITE_OTHER): Payer: Medicaid Other | Admitting: Specialist

## 2018-10-27 ENCOUNTER — Ambulatory Visit: Payer: Self-pay

## 2018-10-27 ENCOUNTER — Other Ambulatory Visit: Payer: Self-pay

## 2018-10-27 ENCOUNTER — Encounter: Payer: Self-pay | Admitting: Specialist

## 2018-10-27 VITALS — BP 106/68 | HR 84 | Ht 66.0 in | Wt 182.0 lb

## 2018-10-27 DIAGNOSIS — S76012A Strain of muscle, fascia and tendon of left hip, initial encounter: Secondary | ICD-10-CM

## 2018-10-27 DIAGNOSIS — M1612 Unilateral primary osteoarthritis, left hip: Secondary | ICD-10-CM

## 2018-10-27 DIAGNOSIS — M25552 Pain in left hip: Secondary | ICD-10-CM

## 2018-10-27 DIAGNOSIS — F411 Generalized anxiety disorder: Secondary | ICD-10-CM | POA: Diagnosis not present

## 2018-10-27 MED ORDER — NAPROXEN 500 MG PO TABS: 500.0000 mg | ORAL_TABLET | Freq: Two times a day (BID) | ORAL | 1 refills | Status: DC

## 2018-10-27 MED ORDER — HYDROCODONE-ACETAMINOPHEN 5-325 MG PO TABS: 1.0000 | ORAL_TABLET | Freq: Four times a day (QID) | ORAL | 0 refills | Status: DC | PRN

## 2018-10-27 MED ORDER — METAXALONE 800 MG PO TABS: 800.0000 mg | ORAL_TABLET | Freq: Three times a day (TID) | ORAL | 1 refills | Status: DC

## 2018-10-27 NOTE — Patient Instructions (Addendum)
You have a left rectus femoris strain over the front of the left hip joint.  The treatment is ice locally, rest and use of a cane or walker. MRI would show the injury. Expect it take about 6 weeks to heal. Hydrocodone for pain. Naprosyn for muscle pain and swelling. Skelaxin for muscle pain.  Left anterior hip spica ACE wrap applied with some improvement in left hip anterior thigh pain.

## 2018-10-27 NOTE — Progress Notes (Signed)
Office Visit Note   Patient: Stacy [name]           Date of Birth: 22-Oct-1960           MRN: [id number] Visit Date: 10/27/2018              Requested by: Marcine MatarJohnson, Deborah B, MD [address] [place],  [place] [id number] PCP: Marcine MatarJohnson, Deborah B, MD   Assessment & Plan: Visit Diagnoses:  1. Pain of left hip joint     Plan: You have a left rectus femoris strain over the front of the left hip joint.  The treatment is ice locally, rest and use of a cane or walker. MRI would show the injury. Expect it take about 6 weeks to heal. Hydrocodone for pain. Naprosyn for muscle pain and swelling. Skelaxin for muscle pain.   Follow-Up Instructions: No follow-ups on file.   Orders:  Orders Placed This Encounter  Procedures  . XR HIP UNILAT W OR W/O PELVIS 1V LEFT   No orders of the defined types were placed in this encounter.     Procedures: No procedures performed   Clinical Data: No additional findings.   Subjective: Chief Complaint  Patient presents with  . Left Leg - Follow-up    58 year old female with history of left groin pain since doing some exercises with the left hip with moving the left hip outwards and in flexion. She reports she did the PT and the following day the left thigh began to hurt and was painful. She has numbness and paresthesias over the left anterior thigh and weakness trying to lift the left leg at the hip and to Lift the left hip to get out of the car or up the stair. Going up the stair leading with the right and going down she leads with the left hip.   Review of Systems  Constitutional: Negative.  Negative for activity change, appetite change, chills, diaphoresis, fatigue, fever and unexpected weight change.  HENT: Negative.   Eyes: Negative.  Negative for photophobia, pain, discharge, redness, itching and visual disturbance.  Respiratory: Negative.  Negative for apnea, cough, choking, chest tightness, shortness of breath, wheezing  and stridor.   Cardiovascular: Negative.  Negative for chest pain, palpitations and leg swelling.  Gastrointestinal: Negative.  Negative for abdominal distention, abdominal pain, anal bleeding, blood in stool, constipation, diarrhea, nausea, rectal pain and vomiting.  Endocrine: Negative for cold intolerance, heat intolerance, polydipsia, polyphagia and polyuria.  Genitourinary: Negative.  Negative for difficulty urinating, dyspareunia, dysuria, enuresis, flank pain, frequency, genital sores, hematuria, menstrual problem, pelvic pain and urgency.  Musculoskeletal: Positive for myalgias. Negative for arthralgias, back pain, gait problem, joint swelling, neck pain and neck stiffness.  Allergic/Immunologic: Negative for environmental allergies, food allergies and immunocompromised state.  Neurological: Positive for weakness and numbness. Negative for dizziness, tremors, seizures, syncope, facial asymmetry, speech difficulty, light-headedness and headaches.     Objective: Vital Signs: BP 106/68 (BP Location: Left Arm, Patient Position: Sitting)   Pulse 84   Ht 5\' 6"  (1.676 m)   Wt 182 lb (82.6 kg)   BMI 29.38 kg/m   Physical Exam  Ortho Exam  Specialty Comments:  No specialty comments available.  Imaging: No results found.   PMFS History: Patient Active Problem List   Diagnosis Date Noted  . Herniation of lumbar intervertebral disc with radiculopathy 10/02/2016    Priority: High    Class: Chronic  . Chondromalacia of both patellae 06/03/2015  Priority: High    Class: Chronic  . Iron deficiency anemia 10/16/2018  . Chronic pain syndrome 09/08/2018  . Chronic pain of right knee 08/11/2018  . Status post lumbar laminectomy 07/15/2018  . Moderate persistent asthma without complication 40/98/1191  . Environmental and seasonal allergies 06/29/2017  . Controlled type 2 diabetes mellitus with diabetic polyneuropathy, without long-term current use of insulin ([place]) 06/29/2017  .  Perennial allergic rhinitis 04/08/2017  . Sensorineural hearing loss (SNHL), bilateral 04/08/2017  . Chronic pansinusitis 03/25/2017  . Eustachian tube dysfunction, bilateral 03/25/2017  . Lichen planopilaris 47/82/9562  . Alopecia areata 08/19/2016  . Spinal stenosis, lumbar region, with neurogenic claudication 06/03/2015  . Tobacco use disorder 04/25/2015  . DJD (degenerative joint disease) of knee 01/04/2015  . Hemorrhoid 11/14/2014  . Gout of big toe 07/19/2014  . Essential hypertension 08/14/2013  . Gastroesophageal reflux disease without esophagitis 08/14/2013  . COPD (chronic obstructive pulmonary disease) ([place]) 04/17/2011   Past Medical History:  Diagnosis Date  . Allergy    Shellfish, cleaning products  . Arthritis   . Arthrofibrosis of total knee replacement ([place])    right  . Asthma   . COPD (chronic obstructive pulmonary disease) ([place])   . Diabetes mellitus    Type II  . GERD (gastroesophageal reflux disease)    Pt on Protonix daily  . Glaucoma   . Gout   . Headache(784.0)    otc meds prn  . Hyperlipidemia   . Hypertension   . Irritable bowel syndrome 11/19/2010  . Neuropathy   . Pneumonia   . Restless legs   . Shortness of breath    07/14/2018- uses  4 times a day    Family History  Problem Relation Age of Onset  . Hypertension Father   . Cancer Father   . Heart disease Mother   . Asthma Son        had as a child  . Heart disease Sister   . Breast cancer Sister   . Hypertension Brother     Past Surgical History:  Procedure Laterality Date  . CHOLECYSTECTOMY    . COLONOSCOPY    . ENDOMETRIAL ABLATION  10/2010  . HERNIA REPAIR     umbicial hernia  . KNEE ARTHROSCOPY Left    06/07/2017 Dr. Marlou Sa of [facility]  . KNEE CLOSED REDUCTION Right 12/06/2015   Procedure: CLOSED MANIPULATION RIGHT KNEE;  Surgeon: [name], MD;  Location: [facility];  Service: Orthopedics;  Laterality: Right;  . KNEE CLOSED REDUCTION Right 01/17/2016   Procedure: CLOSED  MANIPULATION RIGHT KNEE;  Surgeon: [name], MD;  Location: [facility];  Service: Orthopedics;  Laterality: Right;  . KNEE JOINT MANIPULATION Right 12/06/2015  . LUMBAR DISC SURGERY  06/03/2015   L 2  L3 L4 L5   . LUMBAR LAMINECTOMY/DECOMPRESSION MICRODISCECTOMY N/A 06/03/2015   Procedure: Bilateral lateral recess decompression L2-3, L3-4, L4-5;  Surgeon: [name], MD;  Location: [facility];  Service: Orthopedics;  Laterality: N/A;  . LUMBAR LAMINECTOMY/DECOMPRESSION MICRODISCECTOMY N/A 10/02/2016   Procedure: Right L5-S1 Lateral Recess Decompression  microdiscectomy;  Surgeon: [name], MD;  Location: [facility];  Service: Orthopedics;  Laterality: N/A;  . LUMBAR LAMINECTOMY/DECOMPRESSION MICRODISCECTOMY N/A 07/15/2018   Procedure: LEFT L3-4 MICRODISCECTOMY;  Surgeon: [name], MD;  Location: [facility];  Service: Orthopedics;  Laterality: N/A;  . svd      x 2  . TOTAL KNEE ARTHROPLASTY Right 09/06/2015   Procedure: RIGHT TOTAL KNEE  ARTHROPLASTY;  Surgeon: Kerrin Champagne, MD;  Location: [facility] OR;  Service: Orthopedics;  Laterality: Right;  . TUBAL LIGATION    . UPPER GASTROINTESTINAL ENDOSCOPY  04/28/11   Social History   Occupational History  . Occupation: unemployed    Associate Professor: UNEMPLOYED  Tobacco Use  . Smoking status: Current Some Day Smoker    Packs/day: 0.25    Years: 32.00    Pack years: 8.00    Types: Cigarettes  . Smokeless tobacco: Never Used  Substance and Sexual Activity  . Alcohol use: No  . Drug use: No  . Sexual activity: Yes    Birth control/protection: Surgical

## 2018-10-31 DIAGNOSIS — J3089 Other allergic rhinitis: Secondary | ICD-10-CM | POA: Diagnosis not present

## 2018-10-31 DIAGNOSIS — J3081 Allergic rhinitis due to animal (cat) (dog) hair and dander: Secondary | ICD-10-CM | POA: Diagnosis not present

## 2018-10-31 DIAGNOSIS — J301 Allergic rhinitis due to pollen: Secondary | ICD-10-CM | POA: Diagnosis not present

## 2018-11-03 DIAGNOSIS — F411 Generalized anxiety disorder: Secondary | ICD-10-CM | POA: Diagnosis not present

## 2018-11-04 DIAGNOSIS — Z1159 Encounter for screening for other viral diseases: Secondary | ICD-10-CM | POA: Diagnosis not present

## 2018-11-07 DIAGNOSIS — J301 Allergic rhinitis due to pollen: Secondary | ICD-10-CM | POA: Diagnosis not present

## 2018-11-07 DIAGNOSIS — J3081 Allergic rhinitis due to animal (cat) (dog) hair and dander: Secondary | ICD-10-CM | POA: Diagnosis not present

## 2018-11-07 DIAGNOSIS — Z23 Encounter for immunization: Secondary | ICD-10-CM | POA: Diagnosis not present

## 2018-11-07 DIAGNOSIS — F411 Generalized anxiety disorder: Secondary | ICD-10-CM | POA: Diagnosis not present

## 2018-11-07 DIAGNOSIS — J3089 Other allergic rhinitis: Secondary | ICD-10-CM | POA: Diagnosis not present

## 2018-11-08 ENCOUNTER — Encounter: Payer: Medicaid Other | Admitting: Physical Medicine & Rehabilitation

## 2018-11-09 ENCOUNTER — Other Ambulatory Visit: Payer: Medicaid Other

## 2018-11-09 DIAGNOSIS — H04223 Epiphora due to insufficient drainage, bilateral lacrimal glands: Secondary | ICD-10-CM | POA: Diagnosis not present

## 2018-11-09 DIAGNOSIS — H04559 Acquired stenosis of unspecified nasolacrimal duct: Secondary | ICD-10-CM | POA: Diagnosis not present

## 2018-11-09 DIAGNOSIS — H04553 Acquired stenosis of bilateral nasolacrimal duct: Secondary | ICD-10-CM | POA: Diagnosis not present

## 2018-11-10 DIAGNOSIS — F411 Generalized anxiety disorder: Secondary | ICD-10-CM | POA: Diagnosis not present

## 2018-11-11 ENCOUNTER — Other Ambulatory Visit: Payer: Self-pay

## 2018-11-11 ENCOUNTER — Ambulatory Visit: Payer: Medicaid Other | Attending: Specialist | Admitting: Physical Therapy

## 2018-11-11 ENCOUNTER — Encounter: Payer: Self-pay | Admitting: Physical Therapy

## 2018-11-11 DIAGNOSIS — R262 Difficulty in walking, not elsewhere classified: Secondary | ICD-10-CM | POA: Diagnosis not present

## 2018-11-11 DIAGNOSIS — M62830 Muscle spasm of back: Secondary | ICD-10-CM | POA: Diagnosis not present

## 2018-11-11 DIAGNOSIS — M5441 Lumbago with sciatica, right side: Secondary | ICD-10-CM | POA: Insufficient documentation

## 2018-11-11 DIAGNOSIS — Z20828 Contact with and (suspected) exposure to other viral communicable diseases: Secondary | ICD-10-CM | POA: Diagnosis not present

## 2018-11-11 DIAGNOSIS — M5442 Lumbago with sciatica, left side: Secondary | ICD-10-CM | POA: Insufficient documentation

## 2018-11-11 DIAGNOSIS — Z20822 Contact with and (suspected) exposure to covid-19: Secondary | ICD-10-CM

## 2018-11-11 NOTE — Therapy (Signed)
[place] [address] [place], [place], [address] Phone: [phone]   Fax:  [phone]  Physical Therapy Treatment  Patient Details  Name: Stacy [name] [facility] MRN: [id number] Date of Birth: [birth year] Referring Provider (PT): [name]   Encounter Date: 11/11/2018  PT End of Session - 11/11/18 0833    Visit Number  5    Number of Visits  12    Date for PT Re-Evaluation  12/26/18    Authorization Type  Medicaid    PT Start Time  0750    PT Stop Time  0848    PT Time Calculation (min)  58 min    Activity Tolerance  Patient tolerated treatment well    Behavior During Therapy  [facility] for tasks assessed/performed       Past Medical History:  Diagnosis Date  . Allergy    Shellfish, cleaning products  . Arthritis   . Arthrofibrosis of total knee replacement ([place])    right  . Asthma   . COPD (chronic obstructive pulmonary disease) ([place])   . Diabetes mellitus    Type II  . GERD (gastroesophageal reflux disease)    Pt on Protonix daily  . Glaucoma   . Gout   . Headache(784.0)    otc meds prn  . Hyperlipidemia   . Hypertension   . Irritable bowel syndrome 11/19/2010  . Neuropathy   . Pneumonia   . Restless legs   . Shortness of breath    07/14/2018- uses  4 times a day    Past Surgical History:  Procedure Laterality Date  . CHOLECYSTECTOMY    . COLONOSCOPY    . ENDOMETRIAL ABLATION  10/2010  . HERNIA REPAIR     umbicial hernia  . KNEE ARTHROSCOPY Left    06/07/2017 Dr. Marlou Sa of [facility]  . KNEE CLOSED REDUCTION Right 12/06/2015   Procedure: CLOSED MANIPULATION RIGHT KNEE;  Surgeon: [name], MD;  Location: [place];  Service: Orthopedics;  Laterality: Right;  . KNEE CLOSED REDUCTION Right 01/17/2016   Procedure: CLOSED MANIPULATION RIGHT KNEE;  Surgeon: [name], MD;  Location: [place];  Service: Orthopedics;  Laterality: Right;  . KNEE JOINT MANIPULATION Right 12/06/2015  . LUMBAR DISC SURGERY   06/03/2015   L 2  L3 L4 L5   . LUMBAR LAMINECTOMY/DECOMPRESSION MICRODISCECTOMY N/A 06/03/2015   Procedure: Bilateral lateral recess decompression L2-3, L3-4, L4-5;  Surgeon: [name], MD;  Location: [place];  Service: Orthopedics;  Laterality: N/A;  . LUMBAR LAMINECTOMY/DECOMPRESSION MICRODISCECTOMY N/A 10/02/2016   Procedure: Right L5-S1 Lateral Recess Decompression  microdiscectomy;  Surgeon: [name], MD;  Location: [place];  Service: Orthopedics;  Laterality: N/A;  . LUMBAR LAMINECTOMY/DECOMPRESSION MICRODISCECTOMY N/A 07/15/2018   Procedure: LEFT L3-4 MICRODISCECTOMY;  Surgeon: [name], MD;  Location: [place];  Service: Orthopedics;  Laterality: N/A;  . svd      x 2  . TOTAL KNEE ARTHROPLASTY Right 09/06/2015   Procedure: RIGHT TOTAL KNEE ARTHROPLASTY;  Surgeon: [name], MD;  Location: [place];  Service: Orthopedics;  Laterality: Right;  . TUBAL LIGATION    . UPPER GASTROINTESTINAL ENDOSCOPY  04/28/11    There were no vitals filed for this visit.  Subjective Assessment - 11/11/18 0751    Subjective  Pt reports that she has been having L hip pain since last treatment, She stated that if feel like her L leg is going to go out on  her    Pertinent History  multiple back surgeries, TKR's, COPD    Currently in Pain?  Yes    Pain Score  6     Pain Location  Hip    Pain Orientation  Left         [facility] PT Assessment - 11/11/18 0001      AROM   Overall AROM Comments  Lumbar ROM decreased 50% for ext,, 25& for L rotation, all other mothin [facility]                   [facility] Adult PT Treatment/Exercise - 11/11/18 0001      Lumbar Exercises: Stretches   Passive Hamstring Stretch  Left;4 reps;10 seconds;20 seconds    Piriformis Stretch  Left;4 reps;10 seconds;20 seconds      Lumbar Exercises: Aerobic   Nustep  L3 x 7 min      Lumbar Exercises: Standing   Row  Strengthening;20 reps;Theraband;Both    Theraband Level (Row)  Level 2 (Red)      Lumbar Exercises:  Seated   Other Seated Lumbar Exercises  ball squeezes 2x10, hip abduction       Lumbar Exercises: Supine   Ab Set  10 reps;2 seconds    Bridge  20 reps      Modalities   Modalities  Electrical Stimulation;Moist Heat      Moist Heat Therapy   Number Minutes Moist Heat  15 Minutes      Electrical Stimulation   Electrical Stimulation Location  low back     Electrical Stimulation Action  IFC    Electrical Stimulation Parameters  supine to PT tolerance    Electrical Stimulation Goals  Pain               PT Short Term Goals - 11/11/18 0811      PT SHORT TERM GOAL #1   Title  I with initial HEP    Status  Achieved        PT Long Term Goals - 11/11/18 1638      PT LONG TERM GOAL #1   Title  I with advanced HEP      PT LONG TERM GOAL #2   Title  increase lumbar ROM 25%    Status  Partially Met      PT LONG TERM GOAL #3   Title  decreased pain 50% with ADLs    Status  On-going      PT LONG TERM GOAL #4   Title  report able to go up and down stairs without spasms    Status  Partially Met            Plan - 11/11/18 0837    Clinical Impression Statement  No progress towards functional goals, she has increased her lumbar ROM. Pt reports increase hip pain since last session. All interventions performed with little to no resistance. She did report L hip pain while laying supine with knees bent.    Personal Factors and Comorbidities  Fitness;Past/Current Experience;Comorbidity 3+;Social Background;Education;Transportation    Comorbidities  Gout, 2 past back surgeries, TKR's, COPD    Examination-Activity Limitations  Bend;Stairs;Squat;Stand;Lift;Transfers;Locomotion Level    Examination-Participation Restrictions  Cleaning;Laundry    Stability/Clinical Decision Making  Evolving/Moderate complexity    Rehab Potential  Good    PT Frequency  1x / week    PT Duration  12 weeks    PT Treatment/Interventions  ADLs/Self Care Home Management;Cryotherapy;Electrical  Stimulation;Moist Heat;Traction;Ultrasound;Functional mobility training;Patient/family education;Therapeutic  exercise;Therapeutic activities;Manual techniques;Dry needling;Neuromuscular re-education    PT Next Visit Plan  Assess TX progressed as tolerated       Patient will benefit from skilled therapeutic intervention in order to improve the following deficits and impairments:  Abnormal gait, Decreased range of motion, Difficulty walking, Increased muscle spasms, Decreased endurance, Cardiopulmonary status limiting activity, Decreased activity tolerance, Pain, Improper body mechanics, Impaired flexibility, Decreased strength, Postural dysfunction  Visit Diagnosis: Acute bilateral low back pain with bilateral sciatica  Difficulty in walking, not elsewhere classified  Muscle spasm of back     Problem List Patient Active Problem List   Diagnosis Date Noted  . Iron deficiency anemia 10/16/2018  . Chronic pain syndrome 09/08/2018  . Chronic pain of right knee 08/11/2018  . Status post lumbar laminectomy 07/15/2018  . Moderate persistent asthma without complication 16/11/9602  . Environmental and seasonal allergies 06/29/2017  . Controlled type 2 diabetes mellitus with diabetic polyneuropathy, without long-term current use of insulin ([place]) 06/29/2017  . Perennial allergic rhinitis 04/08/2017  . Sensorineural hearing loss (SNHL), bilateral 04/08/2017  . Chronic pansinusitis 03/25/2017  . Eustachian tube dysfunction, bilateral 03/25/2017  . Lichen planopilaris 54/10/8117  . Herniation of lumbar intervertebral disc with radiculopathy 10/02/2016    Class: Chronic  . Alopecia areata 08/19/2016  . Chondromalacia of both patellae 06/03/2015    Class: Chronic  . Spinal stenosis, lumbar region, with neurogenic claudication 06/03/2015  . Tobacco use disorder 04/25/2015  . DJD (degenerative joint disease) of knee 01/04/2015  . Hemorrhoid 11/14/2014  . Gout of big toe 07/19/2014  . Essential  hypertension 08/14/2013  . Gastroesophageal reflux disease without esophagitis 08/14/2013  . COPD (chronic obstructive pulmonary disease) ([place]) 04/17/2011    Scot Jun, PTA 11/11/2018, 8:42 AM  [address] [place], [place], [address] Phone: [phone]   Fax:  [phone]  Name: Elowyn Raupp [facility] MRN: [id number] Date of Birth: April 09, 1960

## 2018-11-12 LAB — NOVEL CORONAVIRUS, NAA: SARS-CoV-2, NAA: NOT DETECTED

## 2018-11-14 ENCOUNTER — Other Ambulatory Visit (INDEPENDENT_AMBULATORY_CARE_PROVIDER_SITE_OTHER): Payer: Self-pay | Admitting: Specialist

## 2018-11-14 ENCOUNTER — Other Ambulatory Visit: Payer: Self-pay | Admitting: Physical Medicine & Rehabilitation

## 2018-11-14 ENCOUNTER — Other Ambulatory Visit: Payer: Self-pay | Admitting: Internal Medicine

## 2018-11-14 DIAGNOSIS — J3089 Other allergic rhinitis: Secondary | ICD-10-CM | POA: Diagnosis not present

## 2018-11-14 DIAGNOSIS — I10 Essential (primary) hypertension: Secondary | ICD-10-CM

## 2018-11-14 DIAGNOSIS — J3081 Allergic rhinitis due to animal (cat) (dog) hair and dander: Secondary | ICD-10-CM | POA: Diagnosis not present

## 2018-11-14 DIAGNOSIS — E1142 Type 2 diabetes mellitus with diabetic polyneuropathy: Secondary | ICD-10-CM

## 2018-11-14 DIAGNOSIS — K219 Gastro-esophageal reflux disease without esophagitis: Secondary | ICD-10-CM

## 2018-11-14 DIAGNOSIS — M109 Gout, unspecified: Secondary | ICD-10-CM

## 2018-11-14 DIAGNOSIS — I739 Peripheral vascular disease, unspecified: Secondary | ICD-10-CM

## 2018-11-14 DIAGNOSIS — J301 Allergic rhinitis due to pollen: Secondary | ICD-10-CM | POA: Diagnosis not present

## 2018-11-15 ENCOUNTER — Encounter: Payer: Medicaid Other | Admitting: Physical Therapy

## 2018-11-16 ENCOUNTER — Other Ambulatory Visit: Payer: Self-pay

## 2018-11-16 ENCOUNTER — Ambulatory Visit
Admission: RE | Admit: 2018-11-16 | Discharge: 2018-11-16 | Disposition: A | Discharge status: Home / Self Care | Payer: Medicaid Other | Source: Ambulatory Visit | Attending: Specialist | Admitting: Specialist

## 2018-11-16 DIAGNOSIS — S76012A Strain of muscle, fascia and tendon of left hip, initial encounter: Secondary | ICD-10-CM

## 2018-11-16 DIAGNOSIS — M1612 Unilateral primary osteoarthritis, left hip: Secondary | ICD-10-CM

## 2018-11-16 DIAGNOSIS — M25552 Pain in left hip: Secondary | ICD-10-CM

## 2018-11-17 ENCOUNTER — Ambulatory Visit: Payer: Medicaid Other | Admitting: Physical Therapy

## 2018-11-18 DIAGNOSIS — F411 Generalized anxiety disorder: Secondary | ICD-10-CM | POA: Diagnosis not present

## 2018-11-21 DIAGNOSIS — J301 Allergic rhinitis due to pollen: Secondary | ICD-10-CM | POA: Diagnosis not present

## 2018-11-21 DIAGNOSIS — J3089 Other allergic rhinitis: Secondary | ICD-10-CM | POA: Diagnosis not present

## 2018-11-21 DIAGNOSIS — J3081 Allergic rhinitis due to animal (cat) (dog) hair and dander: Secondary | ICD-10-CM | POA: Diagnosis not present

## 2018-11-22 ENCOUNTER — Ambulatory Visit: Payer: Medicaid Other | Admitting: Physical Therapy

## 2018-11-22 ENCOUNTER — Other Ambulatory Visit: Payer: Self-pay

## 2018-11-22 ENCOUNTER — Encounter: Payer: Self-pay | Admitting: Physical Therapy

## 2018-11-22 DIAGNOSIS — M5442 Lumbago with sciatica, left side: Secondary | ICD-10-CM | POA: Diagnosis not present

## 2018-11-22 DIAGNOSIS — M62830 Muscle spasm of back: Secondary | ICD-10-CM

## 2018-11-22 DIAGNOSIS — R262 Difficulty in walking, not elsewhere classified: Secondary | ICD-10-CM | POA: Diagnosis not present

## 2018-11-22 DIAGNOSIS — M5441 Lumbago with sciatica, right side: Secondary | ICD-10-CM | POA: Diagnosis not present

## 2018-11-22 NOTE — Therapy (Signed)
[place], [place], [address] Phone: [phone]   Fax:  [phone]  Physical Therapy Treatment  Patient Details  Name: Stacy [name] [facility] MRN: [id number] Date of Birth: April 20, 1960 Referring Provider (PT): [name]   Encounter Date: 11/22/2018  PT End of Session - 11/22/18 1005    Visit Number  6    Number of Visits  12    Date for PT Re-Evaluation  12/26/18    Authorization Type  Medicaid    PT Start Time  0925    PT Stop Time  1020    PT Time Calculation (min)  55 min    Activity Tolerance  Patient tolerated treatment well    Behavior During Therapy  [facility] for tasks assessed/performed       Past Medical History:  Diagnosis Date  . Allergy    Shellfish, cleaning products  . Arthritis   . Arthrofibrosis of total knee replacement ([place])    right  . Asthma   . COPD (chronic obstructive pulmonary disease) ([place])   . Diabetes mellitus    Type II  . GERD (gastroesophageal reflux disease)    Pt on Protonix daily  . Glaucoma   . Gout   . Headache(784.0)    otc meds prn  . Hyperlipidemia   . Hypertension   . Irritable bowel syndrome 11/19/2010  . Neuropathy   . Pneumonia   . Restless legs   . Shortness of breath    07/14/2018- uses  4 times a day    Past Surgical History:  Procedure Laterality Date  . CHOLECYSTECTOMY    . COLONOSCOPY    . ENDOMETRIAL ABLATION  10/2010  . HERNIA REPAIR     umbicial hernia  . KNEE ARTHROSCOPY Left    06/07/2017 Dr. Marlou Sa [facility]  . KNEE CLOSED REDUCTION Right 12/06/2015   Procedure: CLOSED MANIPULATION RIGHT KNEE;  Surgeon: [name], MD;  Location: [place];  Service: Orthopedics;  Laterality: Right;  . KNEE CLOSED REDUCTION Right 01/17/2016   Procedure: CLOSED MANIPULATION RIGHT KNEE;  Surgeon: [name], MD;  Location: [place];  Service: Orthopedics;  Laterality: Right;  . KNEE JOINT MANIPULATION Right 12/06/2015  . LUMBAR DISC  SURGERY  06/03/2015   L 2  L3 L4 L5   . LUMBAR LAMINECTOMY/DECOMPRESSION MICRODISCECTOMY N/A 06/03/2015   Procedure: Bilateral lateral recess decompression L2-3, L3-4, L4-5;  Surgeon: [name], MD;  Location: [place];  Service: Orthopedics;  Laterality: N/A;  . LUMBAR LAMINECTOMY/DECOMPRESSION MICRODISCECTOMY N/A 10/02/2016   Procedure: Right L5-S1 Lateral Recess Decompression  microdiscectomy;  Surgeon: [name], MD;  Location: [place];  Service: Orthopedics;  Laterality: N/A;  . LUMBAR LAMINECTOMY/DECOMPRESSION MICRODISCECTOMY N/A 07/15/2018   Procedure: LEFT L3-4 MICRODISCECTOMY;  Surgeon: [name], MD;  Location: [place];  Service: Orthopedics;  Laterality: N/A;  . svd      x 2  . TOTAL KNEE ARTHROPLASTY Right 09/06/2015   Procedure: RIGHT TOTAL KNEE ARTHROPLASTY;  Surgeon: [name], MD;  Location: [place];  Service: Orthopedics;  Laterality: Right;  . TUBAL LIGATION    . UPPER GASTROINTESTINAL ENDOSCOPY  04/28/11    There were no vitals filed for this visit.  Subjective Assessment - 11/22/18 0926    Subjective  Pt stated that her back is fine, but her L hip is giving her problems now    Currently in Pain?  Yes    Pain  Score  7     Pain Location  Hip    Pain Orientation  Left                       [facility] Adult PT Treatment/Exercise - 11/22/18 0001      Lumbar Exercises: Aerobic   Recumbent Bike  L0 x 3 min    Nustep  L3 x 7 min      Lumbar Exercises: Standing   Row  Strengthening;Theraband;Both;15 reps   x2   Theraband Level (Row)  Level 3 (Green)    Shoulder Extension  Theraband;Strengthening;15 reps   x2   Theraband Level (Shoulder Extension)  Level 3 (Green)      Lumbar Exercises: Seated   Sit to Stand  5 reps   x2 from blue chair      Lumbar Exercises: Supine   Other Supine Lumbar Exercises  LE on ball bridges, K2C, Oblq. and isometric abdominals      Modalities   Modalities  Electrical Stimulation;Moist Heat      Moist Heat Therapy    Number Minutes Moist Heat  15 Minutes    Moist Heat Location  Lumbar Spine      Electrical Stimulation   Electrical Stimulation Location  low back     Electrical Stimulation Action  IFC    Electrical Stimulation Parameters  supinr to Pt tolerance    Electrical Stimulation Goals  Pain               PT Short Term Goals - 11/11/18 6734      PT SHORT TERM GOAL #1   Title  I with initial HEP    Status  Achieved        PT Long Term Goals - 11/11/18 1937      PT LONG TERM GOAL #1   Title  I with advanced HEP      PT LONG TERM GOAL #2   Title  increase lumbar ROM 25%    Status  Partially Met      PT LONG TERM GOAL #3   Title  decreased pain 50% with ADLs    Status  On-going      PT LONG TERM GOAL #4   Title  report able to go up and down stairs without spasms    Status  Partially Met            Plan - 11/22/18 1006    Clinical Impression Statement  Pt continues to report increase L hip pain, She stated that she goes to the MD tomorrow for it. No issues reported with today's interventions. Tactile cues for posture needed with standing shoulder extensions. She did ambulate around clinic without Ad, forward flex posture with decrease L knee TKE.    Personal Factors and Comorbidities  Fitness;Past/Current Experience;Comorbidity 3+;Social Background;Education;Transportation    Examination-Activity Limitations  Bend;Stairs;Squat;Stand;Lift;Transfers;Locomotion Level    Stability/Clinical Decision Making  Evolving/Moderate complexity    Rehab Potential  Good    PT Treatment/Interventions  ADLs/Self Care Home Management;Cryotherapy;Electrical Stimulation;Moist Heat;Traction;Ultrasound;Functional mobility training;Patient/family education;Therapeutic exercise;Therapeutic activities;Manual techniques;Dry needling;Neuromuscular re-education    PT Next Visit Plan  Assess TX progressed as tolerated, pt is reporting L hip issues now       Patient will benefit from skilled  therapeutic intervention in order to improve the following deficits and impairments:  Abnormal gait, Decreased range of motion, Difficulty walking, Increased muscle spasms, Decreased endurance, Cardiopulmonary status limiting activity, Decreased activity tolerance, Pain, Improper  body mechanics, Impaired flexibility, Decreased strength, Postural dysfunction  Visit Diagnosis: Acute bilateral low back pain with bilateral sciatica  Difficulty in walking, not elsewhere classified  Muscle spasm of back     Problem List Patient Active Problem List   Diagnosis Date Noted  . Iron deficiency anemia 10/16/2018  . Chronic pain syndrome 09/08/2018  . Chronic pain of right knee 08/11/2018  . Status post lumbar laminectomy 07/15/2018  . Moderate persistent asthma without complication 13/24/4010  . Environmental and seasonal allergies 06/29/2017  . Controlled type 2 diabetes mellitus with diabetic polyneuropathy, without long-term current use of insulin ([place]) 06/29/2017  . Perennial allergic rhinitis 04/08/2017  . Sensorineural hearing loss (SNHL), bilateral 04/08/2017  . Chronic pansinusitis 03/25/2017  . Eustachian tube dysfunction, bilateral 03/25/2017  . Lichen planopilaris 27/25/3664  . Herniation of lumbar intervertebral disc with radiculopathy 10/02/2016    Class: Chronic  . Alopecia areata 08/19/2016  . Chondromalacia of both patellae 06/03/2015    Class: Chronic  . Spinal stenosis, lumbar region, with neurogenic claudication 06/03/2015  . Tobacco use disorder 04/25/2015  . DJD (degenerative joint disease) of knee 01/04/2015  . Hemorrhoid 11/14/2014  . Gout of big toe 07/19/2014  . Essential hypertension 08/14/2013  . Gastroesophageal reflux disease without esophagitis 08/14/2013  . COPD (chronic obstructive pulmonary disease) ([place]) 04/17/2011    Scot Jun, PTA 11/22/2018, 10:08 AM  [address]  [place], [place], [address] Phone: [phone]   Fax:  [phone]  Name: Stacy [name] [facility] MRN: [id number] Date of Birth: 26-Oct-1960

## 2018-11-23 ENCOUNTER — Ambulatory Visit: Payer: Medicaid Other

## 2018-11-23 ENCOUNTER — Ambulatory Visit (INDEPENDENT_AMBULATORY_CARE_PROVIDER_SITE_OTHER): Payer: Medicaid Other | Admitting: Specialist

## 2018-11-23 ENCOUNTER — Telehealth: Payer: Self-pay | Admitting: Specialist

## 2018-11-23 ENCOUNTER — Encounter: Payer: Self-pay | Admitting: Specialist

## 2018-11-23 ENCOUNTER — Ambulatory Visit
Admission: RE | Admit: 2018-11-23 | Discharge: 2018-11-23 | Disposition: A | Discharge status: Home / Self Care | Payer: Medicaid Other | Source: Ambulatory Visit | Attending: Internal Medicine | Admitting: Internal Medicine

## 2018-11-23 VITALS — BP 112/73 | HR 67 | Ht 66.0 in | Wt 182.0 lb

## 2018-11-23 DIAGNOSIS — M5136 Other intervertebral disc degeneration, lumbar region: Secondary | ICD-10-CM | POA: Diagnosis not present

## 2018-11-23 DIAGNOSIS — Z1231 Encounter for screening mammogram for malignant neoplasm of breast: Secondary | ICD-10-CM | POA: Diagnosis not present

## 2018-11-23 DIAGNOSIS — S76012D Strain of muscle, fascia and tendon of left hip, subsequent encounter: Secondary | ICD-10-CM

## 2018-11-23 DIAGNOSIS — M1612 Unilateral primary osteoarthritis, left hip: Secondary | ICD-10-CM | POA: Diagnosis not present

## 2018-11-23 NOTE — Patient Instructions (Signed)
Plan: Avoid frequent bending and stooping  No lifting greater than 10 lbs. May use ice or moist heat for pain. Weight loss is of benefit. Best medication for lumbar disc disease is arthritis medications like motrin, celebrex and naprosyn. Exercise is important to improve your indurance and does allow people to function better inspite of back pain.  The main ways of treat osteoarthritis, that are found to be success. Weight loss helps to decrease pain. Exercise is important to maintaining cartilage and thickness and strengthening. NSAIDs like motrin, tylenol, alleve are meds decreasing the inflamation. Ice is okay  In afternoon and evening and hot shower in the am Refer to Dr. Ninfa Linden for consideration of left total hip replacement.

## 2018-11-23 NOTE — Progress Notes (Signed)
Office Visit Note   Patient: Stacy [name]           Date of Birth: [birth year]           MRN: [id number] Visit Date: 11/23/2018              Requested by: Marcine Matar, MD [address],  [place] [address] PCP: Marcine Matar, MD   Assessment & Plan: Visit Diagnoses:  1. Unilateral primary osteoarthritis, left hip   2. Hip strain, left, subsequent encounter   3. Degenerative disc disease, lumbar     Plan: Avoid frequent bending and stooping  No lifting greater than 10 lbs. May use ice or moist heat for pain. Weight loss is of benefit. Best medication for lumbar disc disease is arthritis medications like motrin, celebrex and naprosyn. Exercise is important to improve your indurance and does allow people to function better inspite of back pain.  The main ways of treat osteoarthritis, that are found to be success. Weight loss helps to decrease pain. Exercise is important to maintaining cartilage and thickness and strengthening. NSAIDs like motrin, tylenol, alleve are meds decreasing the inflamation. Ice is okay  In afternoon and evening and hot shower in the am Refer to Dr. Magnus Ivan for consideration of left total hip replacement.   Follow-Up Instructions: No follow-ups on file.   Orders:  No orders of the defined types were placed in this encounter.  No orders of the defined types were placed in this encounter.     Procedures: No procedures performed   Clinical Data: Findings:  CLINICAL DATA:  Left hip pain along the lateral aspect.  EXAM: MR OF THE LEFT HIP WITHOUT CONTRAST  TECHNIQUE: Multiplanar, multisequence MR imaging was performed. No intravenous contrast was administered.  COMPARISON:  None.  FINDINGS: Bones: No hip fracture, dislocation or avascular necrosis. No periosteal reaction or bone destruction. No aggressive osseous lesion.  Normal sacrum and sacroiliac joints. No SI joint widening or erosive changes.   Degenerative disease with disc height loss at L4-5.  Articular cartilage and labrum  Articular cartilage: Partial-thickness cartilage loss of left femoral head and acetabulum with mild subchondral reactive marrow changes in the acetabulum. Partial-thickness cartilage loss of the right hip.  Labrum: Left labral degeneration with blunting of the anterior labrum suggesting a degenerative tear. Evaluation is somewhat limited secondary to lack of intra-articular fluid.  Joint or bursal effusion  Joint effusion:  No hip joint effusion.  No SI joint effusion.  Bursae:  No bursa formation.  Muscles and tendons  Flexors: Normal.  Extensors: Normal.  Abductors: Normal.  Adductors: Normal.  Gluteals: Mild tendinosis of the left gluteus minimus tendon insertion. Mild edema in the left gluteus medius muscle at the muscular tendinous junction likely reflecting muscle strain.  Hamstrings: Normal.  Other findings  Miscellaneous: No pelvic free fluid. No fluid collection or hematoma. No inguinal lymphadenopathy. No inguinal hernia.  IMPRESSION: 1. Mild-moderate osteoarthritis of left hip. 2. Mild tendinosis of the left gluteus minimus tendon insertion. Mild edema in the left gluteus medius muscle at the muscular tendinous junction likely reflecting muscle strain. 3. Left labral degeneration with blunting of the anterior labrum suggesting a degenerative tear. Evaluation is somewhat limited secondary to lack of intra-articular fluid. 4. Partially visualized, lower lumbar spine spondylosis.   Electronically Signed   By: Elige Ko   On: 11/16/2018 08:06    Subjective: Chief Complaint  Patient presents with  . Left Hip - Follow-up  MRI Review    58 year old female post right knee replacement  2years ago with stiffness post op and multiple right knee manipulations post surgery. She is having pain in the left thigh and hip, worse with standing and  walking. No bowel or bladder difficulty. No pain with cough or sneeze. She has a little back pain but pain in the left hip is  Present first standing in the AM, Rolling onto the left hip at night with pain and pain into the left medial thigh and  Groin. She has to get up sometimes. No numbness or tingling. There is left groin pain and spasm with standing after Sitting for any length of time. She has difficulty walking, difficulty going up stairs and difficulty reaching her socks and shoes. No weight loss. Hasn't weight herself lately.    Review of Systems  Constitutional: Negative.  Negative for activity change, appetite change, chills, diaphoresis, fatigue, fever and unexpected weight change.  HENT: Negative.  Negative for congestion, dental problem, drooling, ear discharge, ear pain, facial swelling, hearing loss, mouth sores, nosebleeds, postnasal drip, rhinorrhea, sinus pressure, sinus pain, sneezing, sore throat, tinnitus, trouble swallowing and voice change.   Eyes: Positive for visual disturbance. Negative for photophobia, pain, discharge, redness and itching.  Respiratory: Positive for cough, shortness of breath and wheezing.   Cardiovascular: Negative.   Gastrointestinal: Negative.  Negative for abdominal distention, abdominal pain, anal bleeding, blood in stool, constipation, diarrhea, nausea, rectal pain and vomiting.  Endocrine: Negative for cold intolerance, heat intolerance, polydipsia, polyphagia and polyuria.  Genitourinary: Negative.  Negative for difficulty urinating, dyspareunia, dysuria, enuresis, flank pain, frequency, genital sores, hematuria and urgency.  Musculoskeletal: Positive for arthralgias, back pain and gait problem. Negative for joint swelling, myalgias, neck pain and neck stiffness.  Skin: Negative.   Allergic/Immunologic: Positive for environmental allergies and food allergies. Negative for immunocompromised state.  Neurological: Negative for dizziness, tremors,  seizures, syncope, facial asymmetry, speech difficulty, weakness, light-headedness, numbness and headaches.  Hematological: Negative for adenopathy. Does not bruise/bleed easily.  Psychiatric/Behavioral: Negative for agitation, behavioral problems, confusion, decreased concentration, dysphoric mood, hallucinations, self-injury, sleep disturbance and suicidal ideas. The patient is not nervous/anxious and is not hyperactive.      Objective: Vital Signs: BP 112/73 (BP Location: Left Arm, Patient Position: Sitting)   Pulse 67   Ht 5\' 6"  (1.676 m)   Wt 182 lb (82.6 kg)   BMI 29.38 kg/m   Physical Exam  Left Hip Exam   Tenderness  The patient is experiencing tenderness in the greater trochanter and anterior.  Range of Motion  Abduction: abnormal  Adduction: abnormal  Extension: normal  Flexion: normal  External rotation: normal   Muscle Strength  Abduction: 4/5  Adduction: 4/5  Flexion: 4/5   Tests  FABER: negative Ober: negative  Other  Erythema: absent Scars: present Sensation: normal Pulse: present  Comments:  Painful ROM left hip ER and IR and with flexion.    Back Exam   Tenderness  The patient is experiencing tenderness in the lumbar.  Range of Motion  Extension: abnormal  Flexion: abnormal  Lateral bend right: abnormal  Lateral bend left: abnormal   Muscle Strength  Right Quadriceps:  5/5  Left Quadriceps:  5/5  Right Hamstrings:  5/5  Left Hamstrings:  5/5   Tests  Straight leg raise right: negative Straight leg raise left: negative  Reflexes  Patellar: 2/4 Achilles: 2/4 Babinski's sign: normal   Other  Toe walk: normal Heel walk: normal  Sensation: normal Gait: normal  Erythema: no back redness Scars: absent      Specialty Comments:  No specialty comments available.  Imaging: No results found.   PMFS History: Patient Active Problem List   Diagnosis Date Noted  . Herniation of lumbar intervertebral disc with radiculopathy  10/02/2016    Priority: High    Class: Chronic  . Chondromalacia of both patellae 06/03/2015    Priority: High    Class: Chronic  . Iron deficiency anemia 10/16/2018  . Chronic pain syndrome 09/08/2018  . Chronic pain of right knee 08/11/2018  . Status post lumbar laminectomy 07/15/2018  . Moderate persistent asthma without complication 06/29/2017  . Environmental and seasonal allergies 06/29/2017  . Controlled type 2 diabetes mellitus with diabetic polyneuropathy, without long-term current use of insulin (HCC) 06/29/2017  . Perennial allergic rhinitis 04/08/2017  . Sensorineural hearing loss (SNHL), bilateral 04/08/2017  . Chronic pansinusitis 03/25/2017  . Eustachian tube dysfunction, bilateral 03/25/2017  . 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 Alopecia areata 08/19/2016  . Spinal stenosis, lumbar region, with neurogenic claudication 06/03/2015  . Tobacco use disorder 04/25/2015  . DJD (degenerative joint disease) of knee 01/04/2015  . Hemorrhoid 11/14/2014  . Gout of big toe 07/19/2014  . Essential hypertension 08/14/2013  . Gastroesophageal reflux disease without esophagitis 08/14/2013  . COPD (chronic obstructive pulmonary disease) (HCC) 04/17/2011   Past Medical History:  Diagnosis Date  . Allergy    Shellfish, cleaning products  . Arthritis   . 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 Asthma   . COPD (chronic obstructive pulmonary disease) (HCC)   . Diabetes mellitus    Type II  . GERD (gastroesophageal reflux disease)    Pt on Protonix daily  . Glaucoma   . Gout   . Headache(784.0)    otc meds prn  . Hyperlipidemia   . Hypertension   . Irritable bowel syndrome 11/19/2010  . Neuropathy   . Pneumonia   . Restless legs   . Shortness of breath    07/14/2018- uses  4 times a day    Family History  Problem Relation Age of Onset  . Hypertension Father   . Cancer Father   . Heart disease Mother   . Asthma Son        had as a child  . Heart  disease Sister   . Breast cancer Sister   . Hypertension Brother     Past Surgical History:  Procedure Laterality Date  . CHOLECYSTECTOMY    . COLONOSCOPY    . ENDOMETRIAL ABLATION  10/2010  . HERNIA REPAIR     umbicial hernia  . KNEE ARTHROSCOPY Left    [facility]07[facility]  . KNEE CLOSED REDUCTION Right 12/06/2015   Procedure: CLOSED MANIPULATION RIGHT KNEE;  Surgeon: Kerrin ChampagneJames E Yancey Pedley, MD;  Location: [facility] OR;  Service: Orthopedics;  Laterality: Right;  . KNEE CLOSED REDUCTION Right 01/17/2016   Procedure: CLOSED MANIPULATION RIGHT KNEE;  Surgeon: Kerrin ChampagneJames E Kiyanna Biegler, MD;  Location: [facility] OR;  Service: Orthopedics;  Laterality: Right;  . KNEE JOINT MANIPULATION Right 12/06/2015  . LUMBAR DISC SURGERY  06/03/2015   L 2  L3 L4 L5   . LUMBAR LAMINECTOMY/DECOMPRESSION MICRODISCECTOMY N/A 06/03/2015   Procedure: Bilateral lateral recess decompression L2-3, L3-4, L4-5;  Surgeon: Kerrin ChampagneJames E Adriene Padula, MD;  Location: [facility] OR;  Service: Orthopedics;  Laterality: N/A;  . LUMBAR LAMINECTOMY/DECOMPRESSION MICRODISCECTOMY N/A 10/02/2016   Procedure: Right L5-S1 Lateral Recess  Decompression  microdiscectomy;  Surgeon: Kerrin Champagne, MD;  Location: [facility] OR;  Service: Orthopedics;  Laterality: N/A;  . LUMBAR LAMINECTOMY/DECOMPRESSION MICRODISCECTOMY N/A 07/15/2018   Procedure: LEFT L3-4 MICRODISCECTOMY;  Surgeon: Kerrin Champagne, MD;  Location: [facility] OR;  Service: Orthopedics;  Laterality: N/A;  . svd      x 2  . TOTAL KNEE ARTHROPLASTY Right 09/06/2015   Procedure: RIGHT TOTAL KNEE ARTHROPLASTY;  Surgeon: Kerrin Champagne, MD;  Location: [facility] OR;  Service: Orthopedics;  Laterality: Right;  . TUBAL LIGATION    . UPPER GASTROINTESTINAL ENDOSCOPY  04/28/11   Social History   Occupational History  . Occupation: unemployed    Associate Professor: UNEMPLOYED  Tobacco Use  . Smoking status: Current Some Day Smoker    Packs/day: 0.25    Years: 32.00    Pack years: 8.00    Types: Cigarettes  . Smokeless tobacco: Never Used   Substance and Sexual Activity  . Alcohol use: No  . Drug use: No  . Sexual activity: Yes    Birth control/protection: Surgical

## 2018-11-23 NOTE — Telephone Encounter (Signed)
Patient called stating that she needs an updated letter stating that she needs a one level apartment instead of having to climb stairs to get to the bedroom and the bathroom.  Please call patient when letter is ready.  CB#[phone].  Thank you.

## 2018-11-24 ENCOUNTER — Encounter: Payer: Self-pay | Admitting: Internal Medicine

## 2018-11-24 ENCOUNTER — Ambulatory Visit (HOSPITAL_BASED_OUTPATIENT_CLINIC_OR_DEPARTMENT_OTHER): Payer: Medicaid Other | Admitting: Pharmacist

## 2018-11-24 ENCOUNTER — Other Ambulatory Visit: Payer: Self-pay

## 2018-11-24 ENCOUNTER — Other Ambulatory Visit (HOSPITAL_COMMUNITY)
Admission: RE | Admit: 2018-11-24 | Discharge: 2018-11-24 | Disposition: A | Discharge status: Home / Self Care | Payer: Medicaid Other | Source: Ambulatory Visit | Attending: Internal Medicine | Admitting: Internal Medicine

## 2018-11-24 ENCOUNTER — Ambulatory Visit: Payer: Medicaid Other | Attending: Internal Medicine | Admitting: Internal Medicine

## 2018-11-24 VITALS — BP 107/67 | HR 86 | Temp 98.6°F | Resp 16 | Wt 183.6 lb

## 2018-11-24 DIAGNOSIS — Z124 Encounter for screening for malignant neoplasm of cervix: Secondary | ICD-10-CM

## 2018-11-24 DIAGNOSIS — E119 Type 2 diabetes mellitus without complications: Secondary | ICD-10-CM | POA: Diagnosis not present

## 2018-11-24 DIAGNOSIS — J3089 Other allergic rhinitis: Secondary | ICD-10-CM | POA: Diagnosis not present

## 2018-11-24 DIAGNOSIS — M1612 Unilateral primary osteoarthritis, left hip: Secondary | ICD-10-CM | POA: Diagnosis not present

## 2018-11-24 DIAGNOSIS — Z23 Encounter for immunization: Secondary | ICD-10-CM

## 2018-11-24 DIAGNOSIS — D508 Other iron deficiency anemias: Secondary | ICD-10-CM | POA: Diagnosis not present

## 2018-11-24 LAB — GLUCOSE, POCT (MANUAL RESULT ENTRY): POC Glucose: 137 mg/dl — AB (ref 70–99)

## 2018-11-24 MED ORDER — FLUTICASONE PROPIONATE 50 MCG/ACT NA SUSP: 1.0000 | Freq: Every day | NASAL | 6 refills | Status: DC

## 2018-11-24 NOTE — Progress Notes (Signed)
No medications this morning.  No food.

## 2018-11-24 NOTE — Progress Notes (Signed)
Patient ID: Stacy [name], female    DOB: [birth year]  MRN: [id number]  CC: Diabetes, Gynecologic Exam, and Hypertension   Subjective: Stacy [name] is a 58 y.o. female who presents for PAP Her concerns today include:  Pt with hx of HTN, DM with neuropathy,tob dep,HL,PAD, spinal stenosis with neurogenic claudication(s/p laminectomy 07/2018),COPD, RLS,OA knees,gout, anemia, recurrent sinusitis, receiving allergy shots from Dr. Faith Rogue  Pt is G2P2 Last pap was 10/2015 without HPV No vaginal itching, occasional vaginal discgh.  She is not sexually active in past 4-5 yrs Menopausal No fhx of uterine, ovarian and cervical CA.  Breast cancer in a sister who is deceased Had MMG yesterday  Saw her orthopedic specialist Dr. [name] recently in follow-up.  Referred to Dr. Harrell Gave for consideration of L hip replacement for OA.  She has an appointment with him next month.  IDA: Recent iron studies consistent with iron deficiency anemia.  She is up-to-date with colon cancer screening.  Did get iron supplement and taking.  She stopped eating ice  Request RF on Flonase for allergies  Patient Active Problem List   Diagnosis Date Noted  . Iron deficiency anemia 10/16/2018  . Chronic pain syndrome 09/08/2018  . Chronic pain of right knee 08/11/2018  . Status post lumbar laminectomy 07/15/2018  . Moderate persistent asthma without complication 69/48/5462  . Environmental and seasonal allergies 06/29/2017  . Controlled type 2 diabetes mellitus with diabetic polyneuropathy, without long-term current use of insulin ([place]) 06/29/2017  . Perennial allergic rhinitis 04/08/2017  . Sensorineural hearing loss (SNHL), bilateral 04/08/2017  . Chronic pansinusitis 03/25/2017  . Eustachian tube dysfunction, bilateral 03/25/2017  . Lichen planopilaris 70/35/0093  . Herniation of lumbar intervertebral disc with radiculopathy 10/02/2016    Class: Chronic  . Alopecia areata 08/19/2016   . Chondromalacia of both patellae 06/03/2015    Class: Chronic  . Spinal stenosis, lumbar region, with neurogenic claudication 06/03/2015  . Tobacco use disorder 04/25/2015  . DJD (degenerative joint disease) of knee 01/04/2015  . Hemorrhoid 11/14/2014  . Gout of big toe 07/19/2014  . Essential hypertension 08/14/2013  . Gastroesophageal reflux disease without esophagitis 08/14/2013  . COPD (chronic obstructive pulmonary disease) ([facility]) 04/17/2011     Current Outpatient Medications on File Prior to Visit  Medication Sig Dispense Refill  . ACCU-CHEK SOFTCLIX LANCETS lancets USE AS INSTRUCTED 3 TIMES A DAY 100 each 12  . albuterol (PROVENTIL) (2.5 MG/3ML) 0.083% nebulizer solution Take 3 mLs (2.5 mg total) by nebulization every 6 (six) hours as needed for wheezing. 100 vial 6  . ALLERGY RELIEF 10 MG tablet TAKE 1 TABLET (10 MG TOTAL) BY MOUTH DAILY. 30 tablet 11  . allopurinol (ZYLOPRIM) 100 MG tablet TAKE 1 TABLET (100 MG TOTAL) BY MOUTH DAILY. 90 tablet 3  . Blood Glucose Monitoring Suppl (ACCU-CHEK AVIVA PLUS) w/Device KIT 1 each by Does not apply route 3 (three) times daily. 1 kit 0  . budesonide-formoterol (SYMBICORT) 80-4.5 MCG/ACT inhaler INHALE TWO PUFFS BY MOUTH TWICE A DAY (Patient taking differently: Inhale 2 puffs into the lungs 2 (two) times a day. ) 1 Inhaler 11  . celecoxib (CELEBREX) 200 MG capsule TAKE 1 CAPSULE (200 MG TOTAL) BY MOUTH 2 (TWO) TIMES DAILY. 180 capsule 3  . clopidogrel (PLAVIX) 75 MG tablet TAKE 1 TABLET (75 MG TOTAL) BY MOUTH DAILY. 30 tablet 2  . cromolyn (OPTICROM) 4 % ophthalmic solution Place 1 drop into both eyes 4 (four) times daily.     . diclofenac  sodium (VOLTAREN) 1 % GEL Apply 4 g topically 4 (four) times daily. 5 Tube 6  . docusate sodium (COLACE) 100 MG capsule Take 1 capsule (100 mg total) by mouth 2 (two) times daily. 10 capsule 0  . EPINEPHrine 0.3 mg/0.3 mL IJ SOAJ injection 0.3 mg IM x 1 PRN for allergic reaction (Patient taking differently:  Inject 0.3 mg into the muscle as needed for anaphylaxis. ) 1 Device 1  . [name] 325 (65 Fe) MG tablet TAKE 1 TABLET BY MOUTH 2 (TWO) TIMES DAILY WITH A MEAL. 60 tablet 2  . fluticasone (FLONASE) 50 MCG/ACT nasal spray Place 1 spray into both nostrils daily. 16 g 6  . gabapentin (NEURONTIN) 600 MG tablet TAKE 1 TABLET (600 MG TOTAL) BY MOUTH 3 (THREE) TIMES DAILY. 90 tablet 1  . glimepiride (AMARYL) 2 MG tablet TAKE 1 TABLET (2 MG TOTAL) BY MOUTH DAILY. 30 tablet 2  . glucose blood (ACCU-CHEK AVIVA PLUS) test strip USE 3 TIMES A DAY AS DIRECTED BY PHYSICIAN 100 each 12  . hydrochlorothiazide (HYDRODIURIL) 25 MG tablet TAKE 1 TABLET (25 MG TOTAL) BY MOUTH DAILY. 90 tablet 3  . HYDROcodone-acetaminophen (NORCO) 7.5-325 MG tablet Take 1 tablet by mouth every 6 (six) hours as needed for moderate pain. 50 tablet 0  . HYDROcodone-acetaminophen (NORCO/VICODIN) 5-325 MG tablet Take 1-2 tablets by mouth every 6 (six) hours as needed for moderate pain. 30 tablet 0  . hydrOXYzine (ATARAX/VISTARIL) 10 MG tablet TAKE 1 TABLET IN THE MORNING AND NOON AND 2 TABLETS IN THE EVENING AS NEEDED 120 tablet 2  . Lancets (ACCU-CHEK SOFT TOUCH) lancets Use as instructed 100 each 12  . Melatonin 3 MG TABS Take 1 tablet (3 mg total) by mouth at bedtime as needed. (Patient taking differently: Take 3 mg by mouth at bedtime as needed (sleep). ) 30 tablet 3  . metaxalone (SKELAXIN) 800 MG tablet Take 1 tablet (800 mg total) by mouth 3 (three) times daily. 30 tablet 1  . methocarbamol (ROBAXIN) 500 MG tablet Take 1 tablet (500 mg total) by mouth every 8 (eight) hours as needed for muscle spasms. 40 tablet 0  . MITIGARE 0.6 MG CAPS Take 1 capsule by mouth daily. 30 capsule 3  . montelukast (SINGULAIR) 10 MG tablet Take 10 mg by mouth at bedtime.     . Multiple Vitamin (MULTIVITAMIN WITH MINERALS) TABS tablet Take 1 tablet by mouth daily.    . naproxen (NAPROSYN) 500 MG tablet Take 1 tablet (500 mg total) by mouth 2 (two) times  daily with a meal. 40 tablet 1  . nicotine (NICODERM CQ - DOSED IN MG/24 HOURS) 14 mg/24hr patch PLACE 1 PATCH ONTO THE SKIN DAILY 28 patch 2  . oxyCODONE-acetaminophen (PERCOCET/ROXICET) 5-325 MG tablet Take 1 tablet by mouth every 6 (six) hours as needed for severe pain. 40 tablet 0  . pantoprazole (PROTONIX) 40 MG tablet TAKE 1 TABLET (40 MG TOTAL) BY MOUTH DAILY. 30 tablet 2  . Potassium Chloride ER 20 MEQ TBCR Take 20 mEq by mouth daily. 30 tablet 6  . potassium chloride SA (KLOR-CON) 20 MEQ tablet TAKE ONE TABLET BY MOUTH ONCE DAILY 30 tablet 0  . promethazine (PHENERGAN) 25 MG tablet Take 1 tablet (25 mg total) by mouth 2 (two) times daily as needed for nausea or vomiting. 20 tablet 0  . Respiratory Therapy Supplies (FLUTTER) DEVI Use after breathing treatment 4 times daily 1 each 0  . senna-docusate (SENOKOT S) 8.6-50 MG tablet Take 1  tablet by mouth daily as needed for mild constipation. 30 tablet 2  . simvastatin (ZOCOR) 20 MG tablet TAKE ONE TABLET BY MOUTH DAILY AT BEDTIME. 30 tablet 2  . sucralfate (CARAFATE) 1 g tablet Take 1 g by mouth 4 (four) times daily -  with meals and at bedtime.    Marland Kitchen tiotropium (SPIRIVA HANDIHALER) 18 MCG inhalation capsule Place 1 capsule (18 mcg total) into inhaler and inhale daily. 30 capsule 12  . valsartan-hydrochlorothiazide (DIOVAN-HCT) 160-12.5 MG tablet TAKE ONE TABLET BY MOUTH ONCE DAILY 30 tablet 2  . [DISCONTINUED] Fluticasone-Salmeterol (ADVAIR) 500-50 MCG/DOSE AEPB Inhale 1 puff into the lungs every 12 (twelve) hours.      . [DISCONTINUED] lisinopril (PRINIVIL,ZESTRIL) 40 MG tablet Take 40 mg by mouth daily.       No current facility-administered medications on file prior to visit.     Allergies  Allergen Reactions  . Other Shortness Of Breath    UNSPECIFIED AGENTS Allergic to perfumes and cleaning products  . Ace Inhibitors Cough and Other (See Comments)       . Shellfish Allergy Other (See Comments)    Per allergy test.  . Aspirin  Nausea Only    Social History   Socioeconomic History  . Marital status: Married    Spouse name: Not on file  . Number of children: 2  . Years of education: Not on file  . Highest education level: Not on file  Occupational History  . Occupation: unemployed    Fish farm manager: UNEMPLOYED  Social Needs  . Financial resource strain: Not on file  . Food insecurity    Worry: Not on file    Inability: Not on file  . Transportation needs    Medical: Not on file    Non-medical: Not on file  Tobacco Use  . Smoking status: Current Some Day Smoker    Packs/day: 0.25    Years: 32.00    Pack years: 8.00    Types: Cigarettes  . Smokeless tobacco: Never Used  Substance and Sexual Activity  . Alcohol use: No  . Drug use: No  . Sexual activity: Yes    Birth control/protection: Surgical  Lifestyle  . Physical activity    Days per week: Not on file    Minutes per session: Not on file  . Stress: Not on file  Relationships  . Social Herbalist on phone: Not on file    Gets together: Not on file    Attends religious service: Not on file    Active member of club or organization: Not on file    Attends meetings of clubs or organizations: Not on file    Relationship status: Not on file  . Intimate partner violence    Fear of current or ex partner: Not on file    Emotionally abused: Not on file    Physically abused: Not on file    Forced sexual activity: Not on file  Other Topics Concern  . Not on file  Social History Narrative  . Not on file    Family History  Problem Relation Age of Onset  . Hypertension Father   . Cancer Father   . Heart disease Mother   . Asthma Son        had as a child  . Heart disease Sister   . Breast cancer Sister   . Hypertension Brother     Past Surgical History:  Procedure Laterality Date  . CHOLECYSTECTOMY    .  COLONOSCOPY    . ENDOMETRIAL ABLATION  10/2010  . HERNIA REPAIR     umbicial hernia  . KNEE ARTHROSCOPY Left    06/07/2017 Dr.  Marlou Sa [facility]  . KNEE CLOSED REDUCTION Right 12/06/2015   Procedure: CLOSED MANIPULATION RIGHT KNEE;  Surgeon: [name], MD;  Location: [facility];  Service: Orthopedics;  Laterality: Right;  . KNEE CLOSED REDUCTION Right 01/17/2016   Procedure: CLOSED MANIPULATION RIGHT KNEE;  Surgeon: [name], MD;  Location: [facility];  Service: Orthopedics;  Laterality: Right;  . KNEE JOINT MANIPULATION Right 12/06/2015  . LUMBAR DISC SURGERY  06/03/2015   L 2  L3 L4 L5   . LUMBAR LAMINECTOMY/DECOMPRESSION MICRODISCECTOMY N/A 06/03/2015   Procedure: Bilateral lateral recess decompression L2-3, L3-4, L4-5;  Surgeon: [name], MD;  Location: [facility];  Service: Orthopedics;  Laterality: N/A;  . LUMBAR LAMINECTOMY/DECOMPRESSION MICRODISCECTOMY N/A 10/02/2016   Procedure: Right L5-S1 Lateral Recess Decompression  microdiscectomy;  Surgeon: [name], MD;  Location: [place];  Service: Orthopedics;  Laterality: N/A;  . LUMBAR LAMINECTOMY/DECOMPRESSION MICRODISCECTOMY N/A 07/15/2018   Procedure: LEFT L3-4 MICRODISCECTOMY;  Surgeon: [name], MD;  Location: [place] [facility];  Service: Orthopedics;  Laterality: N/A;  . svd      x 2  . TOTAL KNEE ARTHROPLASTY Right 09/06/2015   Procedure: RIGHT TOTAL KNEE ARTHROPLASTY;  Surgeon: [name], MD;  Location: [place];  Service: Orthopedics;  Laterality: Right;  . TUBAL LIGATION    . UPPER GASTROINTESTINAL ENDOSCOPY  04/28/11    ROS: Review of Systems Negative except as stated above  PHYSICAL EXAM: BP 107/67   Pulse 86   Temp 98.6 F (37 C) (Oral)   Resp 16   Wt 183 lb 9.6 oz (83.3 kg)   SpO2 97%   BMI 29.63 kg/m   Wt Readings from Last 3 Encounters:  11/24/18 183 lb 9.6 oz (83.3 kg)  11/23/18 182 lb (82.6 kg)  10/27/18 182 lb (82.6 kg)    Physical Exam  General appearance - alert, well appearing, and in no distress Mental status - normal mood, behavior, speech, dress, motor activity, and thought processes Chest - clear to auscultation, no  wheezes, rales or rhonchi, symmetric air entry Heart - normal rate, regular rhythm, normal S1, S2, no murmurs, rubs, clicks or gallops Pelvic -CMA [place]ock present: Normal external genitalia, vulva, vagina, cervix, uterus and adnexa Diabetic Foot Exam - Simple   Simple Foot Form Visual Inspection See comments: Yes Sensation Testing See comments: Yes Pulse Check Posterior Tibialis and Dorsalis pulse intact bilaterally: Yes Comments Decrease sensation on dorsal surface of feet. Toe nails thick and discolored but not overgrown    Results for orders placed or performed in visit on 11/24/18  Glucose (CBG)  Result Value Ref Range   POC Glucose 137 (A) 70 - 99 mg/dl    Lab Results  Component Value Date   IRON 15 (L) 10/14/2018   TIBC 467 (H) 10/14/2018   FERRITIN 19 10/14/2018    CMP Latest Ref Rng & Units 07/16/2018 07/15/2018 03/18/2018  Glucose 70 - 99 mg/dL 145(H) 150(H) 95  BUN 6 - 20 mg/dL _0 Creatinine 0.44 - 1.00 mg/dL 0.82 0.81 0.92  Sodium 135 - 145 mmol/L 136 140 135  Potassium 3.5 - 5.1 mmol/L 4.0 3.2(L) 3.6  Chloride 98 - 111 mmol/L 100 103 102  CO2 22 - 32 mmol/L _1 Calcium 8.9 - 10.3 mg/dL 9.0  9.5 9.7  Total Protein 6.5 - 8.1 g/dL - 7.7 -  Total Bilirubin 0.3 - 1.2 mg/dL - 0.4 -  Alkaline Phos 38 - 126 U/L - 107 -  AST 15 - 41 U/L - 22 -  ALT 0 - 44 U/L - 17 -   Lipid Panel     Component Value Date/Time   CHOL 189 11/11/2017 0949   TRIG 217 (H) 11/11/2017 0949   HDL 47 11/11/2017 0949   CHOLHDL 4.0 11/11/2017 0949   CHOLHDL 4.2 07/19/2014 1202   VLDL 24 07/19/2014 1202   LDLCALC 99 11/11/2017 0949    CBC    Component Value Date/Time   WBC 9.0 10/14/2018 1704   WBC 8.8 07/16/2018 0415   RBC 4.25 10/14/2018 1704   RBC 3.95 07/16/2018 0415   HGB 9.9 (L) 10/14/2018 1704   HCT 33.4 (L) 10/14/2018 1704   PLT 264 07/16/2018 0415   PLT 284 05/26/2018 1037   MCV 79 10/14/2018 1704   MCH 23.3 (L) 10/14/2018 1704   MCH 26.3 07/16/2018 0415    MCHC 29.6 (L) 10/14/2018 1704   MCHC 33.3 07/16/2018 0415   RDW 15.1 10/14/2018 1704   LYMPHSABS 4.1 (H) 10/14/2018 1704   MONOABS 0.5 03/18/2018 0252   EOSABS 0.1 10/14/2018 1704   BASOSABS 0.0 10/14/2018 1704    ASSESSMENT AND PLAN: 1. Pap smear for cervical cancer screening Cytology sent.  2. Other iron deficiency anemia Patient to continue iron supplement  3. Primary osteoarthritis of left hip Keep appointment with Dr. Ninfa Linden  4. Type 2 diabetes mellitus without complication, without long-term current use of insulin (HCC) Not addressed today - Glucose (CBG)  5. Environmental and seasonal allergies Refill sent to the pharmacy on Flonase nasal spray     Patient was given the opportunity to ask questions.  Patient verbalized understanding of the plan and was able to repeat key elements of the plan.   Orders Placed This Encounter  Procedures  . Glucose (CBG)     Requested Prescriptions    No prescriptions requested or ordered in this encounter    No follow-ups on file.  Karle Plumber, MD, FACP

## 2018-11-24 NOTE — Progress Notes (Signed)
Patient presents for vaccination against influenza per orders of Dr. [name] but reports already receiving at her immunologist's office. I will update this in her chart.

## 2018-11-25 DIAGNOSIS — F411 Generalized anxiety disorder: Secondary | ICD-10-CM | POA: Diagnosis not present

## 2018-11-28 DIAGNOSIS — J3081 Allergic rhinitis due to animal (cat) (dog) hair and dander: Secondary | ICD-10-CM | POA: Diagnosis not present

## 2018-11-28 DIAGNOSIS — J301 Allergic rhinitis due to pollen: Secondary | ICD-10-CM | POA: Diagnosis not present

## 2018-11-28 DIAGNOSIS — J3089 Other allergic rhinitis: Secondary | ICD-10-CM | POA: Diagnosis not present

## 2018-11-29 ENCOUNTER — Ambulatory Visit: Payer: Medicaid Other | Admitting: Physical Therapy

## 2018-11-29 ENCOUNTER — Other Ambulatory Visit: Payer: Self-pay | Admitting: Specialist

## 2018-11-29 ENCOUNTER — Encounter: Payer: Self-pay | Admitting: Physical Therapy

## 2018-11-29 ENCOUNTER — Other Ambulatory Visit: Payer: Self-pay

## 2018-11-29 DIAGNOSIS — M5441 Lumbago with sciatica, right side: Secondary | ICD-10-CM

## 2018-11-29 DIAGNOSIS — M62830 Muscle spasm of back: Secondary | ICD-10-CM

## 2018-11-29 DIAGNOSIS — R262 Difficulty in walking, not elsewhere classified: Secondary | ICD-10-CM | POA: Diagnosis not present

## 2018-11-29 DIAGNOSIS — M5442 Lumbago with sciatica, left side: Secondary | ICD-10-CM | POA: Diagnosis not present

## 2018-11-29 NOTE — Therapy (Signed)
[name] [name] [place], [place], [address] Phone: [phone]   Fax:  [phone]  Physical Therapy Treatment  Patient Details  Name: Stacy [name] MRN: [id number] Date of Birth: [birth year] Referring Provider (PT): [name]   Encounter Date: 11/29/2018  PT End of Session - 11/29/18 0836    Visit Number  7    Number of Visits  12    Date for PT Re-Evaluation  12/26/18    Authorization Type  Medicaid    PT Start Time  0755    PT Stop Time  0850    PT Time Calculation (min)  55 min    Activity Tolerance  Patient tolerated treatment well    Behavior During Therapy  [name] for tasks assessed/performed       Past Medical History:  Diagnosis Date  . Allergy    Shellfish, cleaning products  . Arthritis   . 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 Asthma   . COPD (chronic obstructive pulmonary disease) (HCC)   . Diabetes mellitus    Type II  . GERD (gastroesophageal reflux disease)    Pt on Protonix daily  . Glaucoma   . Gout   . Headache(784.0)    otc meds prn  . Hyperlipidemia   . Hypertension   . Irritable bowel syndrome 11/19/2010  . Neuropathy   . Pneumonia   . Restless legs   . Shortness of breath    07/14/2018- uses  4 times a day    Past Surgical History:  Procedure Laterality Date  . CHOLECYSTECTOMY    . COLONOSCOPY    . ENDOMETRIAL ABLATION  10/2010  . HERNIA REPAIR     umbicial hernia  . KNEE ARTHROSCOPY Left    06/07/2017 Dr. August Saucer [facility]  . KNEE CLOSED REDUCTION Right 12/06/2015   Procedure: CLOSED MANIPULATION RIGHT KNEE;  Surgeon: Kerrin Champagne, MD;  [facility];  Service: Orthopedics;  Laterality: Right;  . KNEE CLOSED REDUCTION Right 01/17/2016   Procedure: CLOSED MANIPULATION RIGHT KNEE;  Surgeon: Kerrin Champagne, MD;  [facility];  Service: Orthopedics;  Laterality: Right;  . KNEE JOINT MANIPULATION Right 12/06/2015  . LUMBAR DISC  SURGERY  06/03/2015   L 2  L3 L4 L5   . LUMBAR LAMINECTOMY/DECOMPRESSION MICRODISCECTOMY N/A 06/03/2015   Procedure: Bilateral lateral recess decompression L2-3, L3-4, L4-5;  Surgeon: Kerrin Champagne, MD;  Location[facility];  Service: Orthopedics;  Laterality: N/A;  . LUMBAR LAMINECTOMY/DECOMPRESSION MICRODISCECTOMY N/A 10/02/2016   Procedure: Right L5-S1 Lateral Recess Decompression  microdiscectomy;  Surgeon: Kerrin Champagne, MD;  [name];  Service: Orthopedics;  Laterality: N/A;  . LUMBAR LAMINECTOMY/DECOMPRESSION MICRODISCECTOMY N/A 07/15/2018   Procedure: LEFT L3-4 MICRODISCECTOMY;  Surgeon: Kerrin Champagne, MD;  [facility];  Service: Orthopedics;  Laterality: N/A;  . svd      x 2  . TOTAL KNEE ARTHROPLASTY Right 09/06/2015   Procedure: RIGHT TOTAL KNEE ARTHROPLASTY;  Surgeon: Kerrin Champagne, MD;  [facility];  Service: Orthopedics;  Laterality: Right;  . TUBAL LIGATION    . UPPER GASTROINTESTINAL ENDOSCOPY  04/28/11    There were no vitals filed for this visit.  Subjective Assessment - 11/29/18 0759    Subjective  "I am feeling pretty good"    Pertinent History  multiple back surgeries, TKR's, COPD    Currently in Pain?  Yes    Pain  Score  3     Pain Location  Hip    Pain Orientation  Left         [facility] PT Assessment - 11/29/18 0001      AROM   Overall AROM Comments  Lumbar ROM WFL                   [facility] Adult PT Treatment/Exercise - 11/29/18 0001      Lumbar Exercises: Aerobic   Nustep  L3 x 7 min      Lumbar Exercises: Machines for Strengthening   Cybex Knee Extension  5 2x10    Cybex Knee Flexion  20lb 2x10     Other Lumbar Machine Exercise  Rows 20lb 2x10 & Lats 20lb 2x10      Lumbar Exercises: Standing   Row  Strengthening;Theraband;Both;20 reps    Theraband Level (Row)  Level 4 (Blue)    Shoulder Extension  Theraband;Strengthening;20 reps    Theraband Level (Shoulder Extension)  Level 3 (Green);Level 4 (Blue)    Other Standing Lumbar  Exercises  ER yellow Tband 2x10       Moist Heat Therapy   Number Minutes Moist Heat  15 Minutes    Moist Heat Location  Lumbar Spine      Electrical Stimulation   Electrical Stimulation Location  low back     Electrical Stimulation Action  IFC    Electrical Stimulation Parameters  Supine to pt tolerance    Electrical Stimulation Goals  Pain               PT Short Term Goals - 11/11/18 0811      PT SHORT TERM GOAL #1   Title  I with initial HEP    Status  Achieved        PT Long Term Goals - 11/29/18 0810      PT LONG TERM GOAL #2   Title  increase lumbar ROM 25%    Status  Achieved            Plan - 11/29/18 0839    Clinical Impression Statement  Pt has progress meeting some goals. She report less pain overall. She has increased her lumbar ROM. Introduced some more postural strengthening and they were completed without issue. Tactile cues needed to maintain erect posture with standing shoulder extensions.    Comorbidities  Gout, 2 past back surgeries, TKR's, COPD    Examination-Activity Limitations  Bend;Stairs;Squat;Stand;Lift;Transfers;Locomotion Level    Examination-Participation Restrictions  Cleaning;Laundry    Stability/Clinical Decision Making  Evolving/Moderate complexity    Rehab Potential  Good    PT Frequency  1x / week    PT Duration  12 weeks    PT Treatment/Interventions  ADLs/Self Care Home Management;Cryotherapy;Electrical Stimulation;Moist Heat;Traction;Ultrasound;Functional mobility training;Patient/family education;Therapeutic exercise;Therapeutic activities;Manual techniques;Dry needling;Neuromuscular re-education    PT Next Visit Plan  Continues to progress postural strengthening and assess tolerance       Patient will benefit from skilled therapeutic intervention in order to improve the following deficits and impairments:  Abnormal gait, Decreased range of motion, Difficulty walking, Increased muscle spasms, Decreased endurance,  Cardiopulmonary status limiting activity, Decreased activity tolerance, Pain, Improper body mechanics, Impaired flexibility, Decreased strength, Postural dysfunction  Visit Diagnosis: Difficulty in walking, not elsewhere classified  Acute bilateral low back pain with bilateral sciatica  Muscle spasm of back     Problem List Patient Active Problem List   Diagnosis Date Noted  . Iron deficiency anemia 10/16/2018  . Chronic  pain syndrome 09/08/2018  . Chronic pain of right knee 08/11/2018  . Status post lumbar laminectomy 07/15/2018  . Moderate persistent asthma without complication 65/99/3570  . Environmental and seasonal allergies 06/29/2017  . Controlled type 2 diabetes mellitus with diabetic polyneuropathy, without long-term current use of insulin ([place]) 06/29/2017  . Perennial allergic rhinitis 04/08/2017  . Sensorineural hearing loss (SNHL), bilateral 04/08/2017  . Chronic pansinusitis 03/25/2017  . Eustachian tube dysfunction, bilateral 03/25/2017  . Lichen planopilaris 17/79/3903  . Herniation of lumbar intervertebral disc with radiculopathy 10/02/2016    Class: Chronic  . Alopecia areata 08/19/2016  . Chondromalacia of both patellae 06/03/2015    Class: Chronic  . Spinal stenosis, lumbar region, with neurogenic claudication 06/03/2015  . Tobacco use disorder 04/25/2015  . DJD (degenerative joint disease) of knee 01/04/2015  . Hemorrhoid 11/14/2014  . Gout of big toe 07/19/2014  . Essential hypertension 08/14/2013  . Gastroesophageal reflux disease without esophagitis 08/14/2013  . COPD (chronic obstructive pulmonary disease) ([place]) 04/17/2011    Scot Jun 11/29/2018, 8:41 AM  [address] [place], [place], [address] Phone: [phone]   Fax:  [phone]  Name: Stacy [name] MRN: [id number] Date of Birth: 06-10-1960

## 2018-11-30 ENCOUNTER — Encounter: Payer: Self-pay | Admitting: Radiology

## 2018-11-30 LAB — CYTOLOGY - PAP
Comment: NEGATIVE
Comment: NEGATIVE
Diagnosis: NEGATIVE
HPV 16: POSITIVE — AB
HPV 18 / 45: NEGATIVE
High risk HPV: POSITIVE — AB

## 2018-11-30 NOTE — Telephone Encounter (Signed)
I called and advised pt that her note was ready, she asked for it to be mailed to her.  This has been done.

## 2018-12-01 ENCOUNTER — Other Ambulatory Visit: Payer: Self-pay | Admitting: Internal Medicine

## 2018-12-01 DIAGNOSIS — F411 Generalized anxiety disorder: Secondary | ICD-10-CM | POA: Diagnosis not present

## 2018-12-01 DIAGNOSIS — R87810 Cervical high risk human papillomavirus (HPV) DNA test positive: Secondary | ICD-10-CM

## 2018-12-02 ENCOUNTER — Telehealth: Payer: Self-pay

## 2018-12-02 ENCOUNTER — Encounter: Payer: Self-pay | Admitting: Physical Medicine & Rehabilitation

## 2018-12-02 ENCOUNTER — Other Ambulatory Visit: Payer: Self-pay

## 2018-12-02 ENCOUNTER — Ambulatory Visit: Payer: Medicaid Other | Admitting: Podiatry

## 2018-12-02 ENCOUNTER — Encounter: Payer: Medicaid Other | Attending: Physical Medicine & Rehabilitation | Admitting: Physical Medicine & Rehabilitation

## 2018-12-02 VITALS — BP 101/60 | HR 76 | Temp 97.3°F | Ht 66.0 in | Wt 182.0 lb

## 2018-12-02 DIAGNOSIS — M25561 Pain in right knee: Secondary | ICD-10-CM | POA: Diagnosis not present

## 2018-12-02 DIAGNOSIS — M1612 Unilateral primary osteoarthritis, left hip: Secondary | ICD-10-CM | POA: Diagnosis not present

## 2018-12-02 DIAGNOSIS — M961 Postlaminectomy syndrome, not elsewhere classified: Secondary | ICD-10-CM | POA: Diagnosis not present

## 2018-12-02 DIAGNOSIS — G8929 Other chronic pain: Secondary | ICD-10-CM | POA: Diagnosis not present

## 2018-12-02 DIAGNOSIS — Z96651 Presence of right artificial knee joint: Secondary | ICD-10-CM | POA: Diagnosis not present

## 2018-12-02 DIAGNOSIS — G894 Chronic pain syndrome: Secondary | ICD-10-CM | POA: Diagnosis not present

## 2018-12-02 DIAGNOSIS — R269 Unspecified abnormalities of gait and mobility: Secondary | ICD-10-CM | POA: Diagnosis not present

## 2018-12-02 NOTE — Progress Notes (Signed)
Subjective:    Patient ID: Stacy [name], female    DOB: 05/19/1960, 58 y.o.   MRN: [id number]  HPI Female with pmh/psh RLS, neuropathy, HTN, headache, GERD, DM, COPD, lumbar spinal stenosis with claudication s/p lumbar surgery x3 (most recent 07/15/2018), DJD, PAD right TKA in 2017 followed up by manipulation x2 presents LE pain. Initially stated: History taken from chart review and patient. Started ~2018.  States she had a knee pain with buckling.  Denies inciting event.  Getting improve.  Ice/heat improves the pain.  Prolonged standing exacerbates the pain.  Non-radiating. Sharp.  Intermittent.  Denies associated numbness.  Denies falls. Pain limits prolonged standing and ambulation.   Last clinic visit 09/08/2018.  Since that time, she has been following up with Ortho and potentially going to have a left THA. She tried Lidoderm with good benefit. She is more active now.  She states she had 2 falls due to her hip.  She had benefit with increase in Gabapentin. Hip is her main problem at present.  Pain Inventory Average Pain 5 Pain Right Now 5 My pain is intermittent, sharp, tingling and aching  In the last 24 hours, has pain interfered with the following? General activity 7 Relation with others 0 Enjoyment of life 5 What TIME of day is your pain at its worst? all Sleep (in general) Fair  Pain is worse with: walking, sitting and standing Pain improves with: rest, heat/ice and TENS Relief from Meds: 1  Mobility walk with assistance use a walker how many minutes can you walk? 20-30 ability to climb steps?  no do you drive?  no Do you have any goals in this area?  no  Function disabled: date disabled 2016  Neuro/Psych trouble walking spasms anxiety  Prior Studies Any changes since last visit?  no  Physicians involved in your care Any changes since last visit?  no   Family History  Problem Relation Age of Onset  . Hypertension Father   . Cancer Father   .  Heart disease Mother   . Asthma Son        had as a child  . Heart disease Sister   . Breast cancer Sister   . Hypertension Brother    Social History   Socioeconomic History  . Marital status: Married    Spouse name: Not on file  . Number of children: 2  . Years of education: Not on file  . Highest education level: Not on file  Occupational History  . Occupation: unemployed    Associate Professormployer: UNEMPLOYED  Social Needs  . Financial resource strain: Not on file  . Food insecurity    Worry: Not on file    Inability: Not on file  . Transportation needs    Medical: Not on file    Non-medical: Not on file  Tobacco Use  . Smoking status: Current Some Day Smoker    Packs/day: 0.25    Years: 32.00    Pack years: 8.00    Types: Cigarettes  . Smokeless tobacco: Never Used  Substance and Sexual Activity  . Alcohol use: No  . Drug use: No  . Sexual activity: Yes    Birth control/protection: Surgical  Lifestyle  . Physical activity    Days per week: Not on file    Minutes per session: Not on file  . Stress: Not on file  Relationships  . Social Musicianconnections    Talks on phone: Not on file    Gets together:  Not on file    Attends religious service: Not on file    Active member of club or organization: Not on file    Attends meetings of clubs or organizations: Not on file    Relationship status: Not on file  Other Topics Concern  . Not on file  Social History Narrative  . Not on file   Past Surgical History:  Procedure Laterality Date  . CHOLECYSTECTOMY    . COLONOSCOPY    . ENDOMETRIAL ABLATION  10/2010  . HERNIA REPAIR     umbicial hernia  . KNEE ARTHROSCOPY Left    06/07/2017 Dr. Marlou Sa of [facility]  . KNEE CLOSED REDUCTION Right 12/06/2015   Procedure: CLOSED MANIPULATION RIGHT KNEE;  Surgeon: [name], MD;  Location: [facility];  Service: Orthopedics;  Laterality: Right;  . KNEE CLOSED REDUCTION Right 01/17/2016   Procedure: CLOSED MANIPULATION RIGHT KNEE;  Surgeon:  [name], MD;  Location: [facility];  Service: Orthopedics;  Laterality: Right;  . KNEE JOINT MANIPULATION Right 12/06/2015  . LUMBAR DISC SURGERY  06/03/2015   L 2  L3 L4 L5   . LUMBAR LAMINECTOMY/DECOMPRESSION MICRODISCECTOMY N/A 06/03/2015   Procedure: Bilateral lateral recess decompression L2-3, L3-4, L4-5;  Surgeon: [name], MD;  Location: [facility];  Service: Orthopedics;  Laterality: N/A;  . LUMBAR LAMINECTOMY/DECOMPRESSION MICRODISCECTOMY N/A 10/02/2016   Procedure: Right L5-S1 Lateral Recess Decompression  microdiscectomy;  Surgeon: [name], MD;  Location: [facility];  Service: Orthopedics;  Laterality: N/A;  . LUMBAR LAMINECTOMY/DECOMPRESSION MICRODISCECTOMY N/A 07/15/2018   Procedure: LEFT L3-4 MICRODISCECTOMY;  Surgeon: [name], MD;  Location: [facility];  Service: Orthopedics;  Laterality: N/A;  . svd      x 2  . TOTAL KNEE ARTHROPLASTY Right 09/06/2015   Procedure: RIGHT TOTAL KNEE ARTHROPLASTY;  Surgeon: [name], MD;  Location: [facility];  Service: Orthopedics;  Laterality: Right;  . TUBAL LIGATION    . UPPER GASTROINTESTINAL ENDOSCOPY  04/28/11   Past Medical History:  Diagnosis Date  . Allergy    Shellfish, cleaning products  . Arthritis   . Arthrofibrosis of total knee replacement ([place])    right  . Asthma   . COPD (chronic obstructive pulmonary disease) ([place])   . Diabetes mellitus    Type II  . GERD (gastroesophageal reflux disease)    Pt on Protonix daily  . Glaucoma   . Gout   . Headache(784.0)    otc meds prn  . Hyperlipidemia   . Hypertension   . Irritable bowel syndrome 11/19/2010  . Neuropathy   . Pneumonia   . Restless legs   . Shortness of breath    07/14/2018- uses  4 times a day   BP 101/60   Pulse 76   Temp (!) 97.3 F (36.3 C)   Ht 5\' 6"  (1.676 m)   Wt 182 lb (82.6 kg)   SpO2 92%   BMI 29.38 kg/m   Opioid Risk Score:   Fall Risk Score:  `1  Depression screen PHQ 2/9  Depression screen [facility] 2/9 10/14/2018 08/11/2018 08/09/2018 06/06/2018  03/30/2018 02/14/2018 12/06/2017  Decreased Interest 0 0 0 0 0 0 0  Down, Depressed, Hopeless 0 0 0 0 0 0 0  PHQ - 2 Score 0 0 0 0 0 0 0  Altered sleeping - - 0 0 - - 0  Tired, decreased energy - - 0 0 - - 2  Change in appetite - - 0  0 - - 1  Feeling bad or failure about yourself  - - 0 0 - - 0  Trouble concentrating - - 0 0 - - 0  Moving slowly or fidgety/restless - - 0 0 - - 0  Suicidal thoughts - - 0 - - - 0  PHQ-9 Score - - 0 0 - - 3  Difficult doing work/chores - - Not difficult at all - - - -  Some recent data might be hidden    Review of Systems  Constitutional: Positive for diaphoresis.  HENT: Negative.   Eyes: Negative.   Respiratory: Negative.   Cardiovascular: Negative.   Gastrointestinal: Negative.   Endocrine: Negative.   Genitourinary: Negative.   Musculoskeletal: Positive for arthralgias and gait problem.       Spasms   Skin: Negative.   Allergic/Immunologic: Negative.   Hematological: Negative.   Psychiatric/Behavioral: The patient is nervous/anxious.        Objective:   Physical Exam Gen: NAD. Vital signs reviewed HENT: Normocephalic. Atraumatic.  Eyes: EOMI. No discharge.  Cardio: No JVD. Pulm: Effort normal. Abd: Non-distended MSK:  Gait WNL.  +TTP left greatertrochanteric area with pain with ER/IR   No edema.  Neuro:  Sensation diminished to light touch in all LLE    Strength  5/5 in all LE myotomes Skin: Warm and Dry. Intact    Assessment & Plan:  Female with pmh/psh RLS, neuropathy, HTN, headache, GERD, DM, COPD, lumbar spinal stenosis with claudication s/p lumbar surgery x3 (most recent 07/15/2018), DJD, PAD right TKA in 2017 followed up by manipulation x2 presents with LE pain.  1. Chronic right knee pain s/p TKA with manipulation x2  MRI from 09/2017 showing postop changes  Cont Heat/Cold  Cont outpatient PT  Cont E-stim  Will consider bracing if necessary  Cont OTC Lidoderm patch again  Will consider Cymbalta  Will consider Robaxin    Will consider Mobic  Patient states main goal is to be more active, which she is doing  Relatively controlled at present   2. Gait abnormality  Cont rollator due to hip pain  Cont therapies  See #1  3. LLE pain  Cont Gabapentin to 600 TID  See #1  4. Left hip pain  MRI suggesting Labral and OA with muscle strain  Some component of bursisits - discussed injection, however patient refusing, states she cannot tolerate needles and would rather have replacement  Follow up per ortho

## 2018-12-02 NOTE — Telephone Encounter (Signed)
Contacted pt to go over pap results pt didn't answer lvm asking pt to give me a call at her earliest convenience  

## 2018-12-07 ENCOUNTER — Ambulatory Visit: Payer: Self-pay

## 2018-12-07 ENCOUNTER — Encounter: Payer: Self-pay | Admitting: Orthopaedic Surgery

## 2018-12-07 ENCOUNTER — Ambulatory Visit (INDEPENDENT_AMBULATORY_CARE_PROVIDER_SITE_OTHER): Payer: Medicaid Other | Admitting: Orthopaedic Surgery

## 2018-12-07 DIAGNOSIS — M25552 Pain in left hip: Secondary | ICD-10-CM | POA: Diagnosis not present

## 2018-12-07 NOTE — Progress Notes (Signed)
Subjective: Patient is here for ultrasound-guided intra-articular left hip injection.   Moderate DJD causing groin pain.  Objective:  Pain and decreased ROM with IR.  Procedure: Ultrasound-guided left hip injection: After sterile prep with Betadine, injected 8 cc 1% lidocaine without epinephrine and 40 mg methylprednisolone using a 22-gauge spinal needle, passing the needle through the iliofemoral ligament into the femoral head/neck junction.  Injectate seen filling joint capsule. Very good immediate relief.

## 2018-12-07 NOTE — Progress Notes (Signed)
Office Visit Note   Patient: Stacy [name]           Date of Birth: 1960/02/11           MRN: [id number] Visit Date: 12/07/2018              Requested by: [name], MD [address] [place] [address] [place],  [place] [address] PCP: [name], MD   Assessment & Plan: Visit Diagnoses:  1. Pain of left hip joint     Plan: I do feel that some of her pain is out of proportion of exam and what her x-ray findings are.  I have advocated an intra-articular steroid injection into the left hip joint under ultrasound.  I will have her see Dr. Junius Roads for this today and she agreed to try this.  I would like to see her back in 2 weeks to see if this is helped.  All question concerns were answered and addressed.  Follow-Up Instructions: Return in about 2 weeks (around 12/21/2018).   Orders:  No orders of the defined types were placed in this encounter.  No orders of the defined types were placed in this encounter.     Procedures: No procedures performed   Clinical Data: No additional findings.   Subjective: Chief Complaint  Patient presents with  . Left Hip - Pain  The patient is referred to me from Dr. [name] to evaluate and treat osteoarthritis of her left hip.  She does ambulate with a rolling walker and has a history of a right total knee arthroplasty.  Her left hip is been hurting for about a year.  It does hurt in the groin area.  She has had plain films and an MRI of that hip and they are on the system to review.  She has not had any type of intervention in the hip in terms of a steroid injection at all.  She is a diabetic but reports her last hemoglobin A1c is 7.3 and I did confirm that.  She says her daily blood glucose is under good control.  She is a smoker.  HPI  Review of Systems  She currently denies any headache, chest pain, shortness of breath, fever, chills, nausea, vomiting Objective: Vital Signs: There were no vitals taken for this visit.  Physical Exam  She is alert and orient x3 and in no acute Ortho Exam  on examination of her left and right hips and complete both hips with full range of motion with no blocks to the motion at all.  She does have some guarding on the left side but is not severe.  Her leg lengths are equal. Specialty Comments:  No specialty comments available.  Imaging: No results found. I did independently reviewed plain films of her hip as well as the MRI of her left hip.  Her arthritis is only mild.  The joint space is still well-maintained.  There is only slight degenerative changes of the hip with no effusion.  There is not significant cartilage defect or deficit.  PMFS History: Patient Active Problem List   Diagnosis Date Noted  . Primary osteoarthritis of left hip 12/02/2018  . Iron deficiency anemia 10/16/2018  . Chronic pain syndrome 09/08/2018  . Chronic pain of right knee 08/11/2018  . Status post lumbar laminectomy 07/15/2018  . Moderate persistent asthma without complication 69/62/9528  . Environmental and seasonal allergies 06/29/2017  . Controlled type 2 diabetes mellitus with diabetic polyneuropathy, without long-term current use of  insulin ([facility]) 06/29/2017  . Perennial allergic rhinitis 04/08/2017  . Sensorineural hearing loss (SNHL), bilateral 04/08/2017  . Chronic pansinusitis 03/25/2017  . Eustachian tube dysfunction, bilateral 03/25/2017  . 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 Herniation of lumbar intervertebral disc with radiculopathy 10/02/2016    Class: Chronic  . Alopecia areata 08/19/2016  . Chondromalacia of both patellae 06/03/2015    Class: Chronic  . Spinal stenosis, lumbar region, with neurogenic claudication 06/03/2015  . Tobacco use disorder 04/25/2015  . DJD (degenerative joint disease) of knee 01/04/2015  . Hemorrhoid 11/14/2014  . Gout of big toe 07/19/2014  . Essential hypertension 08/14/2013  . Gastroesophageal reflux disease without esophagitis 08/14/2013  . COPD (chronic  obstructive pulmonary disease) ([facility]) 04/17/2011   Past Medical History:  Diagnosis Date  . Allergy    Shellfish, cleaning products  . Arthritis   . 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 Asthma   . COPD (chronic obstructive pulmonary disease) ([facility])   . Diabetes mellitus    Type II  . GERD (gastroesophageal reflux disease)    Pt on Protonix daily  . Glaucoma   . Gout   . Headache(784.0)    otc meds prn  . Hyperlipidemia   . Hypertension   . Irritable bowel syndrome 11/19/2010  . Neuropathy   . Pneumonia   . Restless legs   . Shortness of breath    07/14/2018- uses  4 times a day    Family History  Problem Relation Age of Onset  . Hypertension Father   . Cancer Father   . Heart disease Mother   . Asthma Son        had as a child  . Heart disease Sister   . Breast cancer Sister   . Hypertension Brother     Past Surgical History:  Procedure Laterality Date  . CHOLECYSTECTOMY    . COLONOSCOPY    . ENDOMETRIAL ABLATION  10/2010  . HERNIA REPAIR     umbicial hernia  . KNEE ARTHROSCOPY Left    06/07/2017 Dr. August Saucer [facility]  . KNEE CLOSED REDUCTION Right 12/06/2015   Procedure: CLOSED MANIPULATION RIGHT KNEE;  Surgeon: Kerrin Champagne, MD;  Location[facility];  Service: Orthopedics;  Laterality: Right;  . KNEE CLOSED REDUCTION Right 01/17/2016   Procedure: CLOSED MANIPULATION RIGHT KNEE;  Surgeon: Kerrin Champagne, MD;  Location: [facility];  Service: Orthopedics;  Laterality: Right;  . KNEE JOINT MANIPULATION Right 12/06/2015  . LUMBAR DISC SURGERY  06/03/2015   L 2  L3 L4 L5   . LUMBAR LAMINECTOMY/DECOMPRESSION MICRODISCECTOMY N/A 06/03/2015   Procedure: Bilateral lateral recess decompression L2-3, L3-4, L4-5;  Surgeon: Kerrin Champagne, MD;  Location: [facility];  Service: Orthopedics;  Laterality: N/A;  . LUMBAR LAMINECTOMY/DECOMPRESSION MICRODISCECTOMY N/A 10/02/2016   Procedure: Right L5-S1 Lateral Recess Decompression  microdiscectomy;  Surgeon: Kerrin Champagne,  MD;  [facility];  Service: Orthopedics;  Laterality: N/A;  . LUMBAR LAMINECTOMY/DECOMPRESSION MICRODISCECTOMY N/A 07/15/2018   Procedure: LEFT L3-4 MICRODISCECTOMY;  Surgeon: Kerrin Champagne, MD;  Location: [facility];  Service: Orthopedics;  Laterality: N/A;  . svd      x 2  . TOTAL KNEE ARTHROPLASTY Right 09/06/2015   Procedure: RIGHT TOTAL KNEE ARTHROPLASTY;  Surgeon: Kerrin Champagne, MD;  Location: [facility];  Service: Orthopedics;  Laterality: Right;  . TUBAL LIGATION    . UPPER GASTROINTESTINAL ENDOSCOPY  04/28/11   Social History   Occupational  History  . Occupation: unemployed    Associate Professormployer: UNEMPLOYED  Tobacco Use  . Smoking status: Current Some Day Smoker    Packs/day: 0.25    Years: 32.00    Pack years: 8.00    Types: Cigarettes  . Smokeless tobacco: Never Used  Substance and Sexual Activity  . Alcohol use: No  . Drug use: No  . Sexual activity: Yes    Birth control/protection: Surgical

## 2018-12-08 DIAGNOSIS — J3081 Allergic rhinitis due to animal (cat) (dog) hair and dander: Secondary | ICD-10-CM | POA: Diagnosis not present

## 2018-12-08 DIAGNOSIS — J301 Allergic rhinitis due to pollen: Secondary | ICD-10-CM | POA: Diagnosis not present

## 2018-12-08 DIAGNOSIS — F411 Generalized anxiety disorder: Secondary | ICD-10-CM | POA: Diagnosis not present

## 2018-12-08 DIAGNOSIS — J3089 Other allergic rhinitis: Secondary | ICD-10-CM | POA: Diagnosis not present

## 2018-12-12 ENCOUNTER — Ambulatory Visit: Payer: Medicaid Other | Admitting: Critical Care Medicine

## 2018-12-13 DIAGNOSIS — J3081 Allergic rhinitis due to animal (cat) (dog) hair and dander: Secondary | ICD-10-CM | POA: Diagnosis not present

## 2018-12-13 DIAGNOSIS — J3089 Other allergic rhinitis: Secondary | ICD-10-CM | POA: Diagnosis not present

## 2018-12-13 DIAGNOSIS — J301 Allergic rhinitis due to pollen: Secondary | ICD-10-CM | POA: Diagnosis not present

## 2018-12-15 ENCOUNTER — Other Ambulatory Visit: Payer: Self-pay | Admitting: Internal Medicine

## 2018-12-15 DIAGNOSIS — F411 Generalized anxiety disorder: Secondary | ICD-10-CM | POA: Diagnosis not present

## 2018-12-15 DIAGNOSIS — E119 Type 2 diabetes mellitus without complications: Secondary | ICD-10-CM

## 2018-12-16 DIAGNOSIS — J3089 Other allergic rhinitis: Secondary | ICD-10-CM | POA: Diagnosis not present

## 2018-12-16 DIAGNOSIS — J301 Allergic rhinitis due to pollen: Secondary | ICD-10-CM | POA: Diagnosis not present

## 2018-12-16 DIAGNOSIS — J3081 Allergic rhinitis due to animal (cat) (dog) hair and dander: Secondary | ICD-10-CM | POA: Diagnosis not present

## 2018-12-20 DIAGNOSIS — J3089 Other allergic rhinitis: Secondary | ICD-10-CM | POA: Diagnosis not present

## 2018-12-20 DIAGNOSIS — J3081 Allergic rhinitis due to animal (cat) (dog) hair and dander: Secondary | ICD-10-CM | POA: Diagnosis not present

## 2018-12-20 DIAGNOSIS — J301 Allergic rhinitis due to pollen: Secondary | ICD-10-CM | POA: Diagnosis not present

## 2018-12-22 DIAGNOSIS — Z1211 Encounter for screening for malignant neoplasm of colon: Secondary | ICD-10-CM | POA: Diagnosis not present

## 2018-12-22 DIAGNOSIS — F411 Generalized anxiety disorder: Secondary | ICD-10-CM | POA: Diagnosis not present

## 2018-12-22 DIAGNOSIS — K3184 Gastroparesis: Secondary | ICD-10-CM | POA: Diagnosis not present

## 2018-12-22 DIAGNOSIS — K219 Gastro-esophageal reflux disease without esophagitis: Secondary | ICD-10-CM | POA: Diagnosis not present

## 2018-12-22 DIAGNOSIS — R110 Nausea: Secondary | ICD-10-CM | POA: Diagnosis not present

## 2018-12-25 NOTE — Progress Notes (Signed)
 Subjective:    Patient ID: Stacy [name], female    DOB: 06/16/1960, 58 y.o.   MRN: [id number]  57 y.o.F with chronic asthma and chronic obstructive lung disease.  Thus the patient has asthma COPD overlap syndrome  This patient has a longstanding history of COPD dating back to 2013.  Patient prior to this had significant moderate persistent asthma as well.  The patient is still smoking 3 cigarettes a day but smoked more than this in the past.  Since the last visit in May the patient's dyspnea is slightly improved.  Mucus is much easier to raise using the flutter valve she obtained at the last visit.  Her HFA technique was reviewed again and was improved.  The patient is maintaining Symbicort and Spiriva daily.  Smoking is down to 3 cigarettes/week Please review shortness of breath assessment below  The patient did have low back surgery and previous visits with orthopedics.   11/23  Chronic obstructive lung disease with asthmatic bronchitic component improved on Spiriva and Symbicort  No new complaints at this time with the exception she does complain of lesion on the left ear  Patient still smoking is down to 3 cigarettes a day   Shortness of Breath This is a chronic problem. The current episode started more than 1 year ago. The problem occurs daily (walking or at night will wheeze). The problem has been unchanged. Associated symptoms include orthopnea, PND, rhinorrhea, sputum production and wheezing. Pertinent negatives include no chest pain, fever, headaches, hemoptysis, leg swelling, sore throat or syncope. The symptoms are aggravated by any activity, exercise, lying flat, URIs and weather changes. Associated symptoms comments: Coughs up some yellow mucus at night. Risk factors include smoking. She has tried beta agonist inhalers, ipratropium inhalers and steroid inhalers for the symptoms. The treatment provided moderate relief. Her past medical history is significant for  allergies, aspirin allergies, asthma, COPD and pneumonia. There is no history of CAD, DVT, a heart failure or PE.   Past Medical History:  Diagnosis Date  . Allergy    Shellfish, cleaning products  . Arthritis   . 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 Asthma   . COPD (chronic obstructive pulmonary disease) (HCC)   . Diabetes mellitus    Type II  . GERD (gastroesophageal reflux disease)    Pt on Protonix daily  . Glaucoma   . Gout   . Headache(784.0)    otc meds prn  . Hyperlipidemia   . Hypertension   . Irritable bowel syndrome 11/19/2010  . Neuropathy   . Pneumonia   . Restless legs   . Shortness of breath    07/14/2018- uses  4 times a day     Family History  Problem Relation Age of Onset  . Hypertension Father   . Cancer Father   . Heart disease Mother   . Asthma Son        had as a child  . Heart disease Sister   . Breast cancer Sister   . Hypertension Brother      Social History   Socioeconomic History  . Marital status: Married    Spouse name: Not on file  . Number of children: 2  . Years of education: Not on file  . Highest education level: Not on file  Occupational History  . Occupation: unemployed    Employer: UNEMPLOYED  Social Needs  . Financial resource strain: Not on file  . Food insecurity      Worry: Not on file    Inability: Not on file  . Transportation needs    Medical: Not on file    Non-medical: Not on file  Tobacco Use  . Smoking status: Current Some Day Smoker    Packs/day: 0.25    Years: 32.00    Pack years: 8.00    Types: Cigarettes  . Smokeless tobacco: Never Used  Substance and Sexual Activity  . Alcohol use: No  . Drug use: No  . Sexual activity: Yes    Birth control/protection: Surgical  Lifestyle  . Physical activity    Days per week: Not on file    Minutes per session: Not on file  . Stress: Not on file  Relationships  . Social connections    Talks on phone: Not on file    Gets together:  Not on file    Attends religious service: Not on file    Active member of club or organization: Not on file    Attends meetings of clubs or organizations: Not on file    Relationship status: Not on file  . Intimate partner violence    Fear of current or ex partner: Not on file    Emotionally abused: Not on file    Physically abused: Not on file    Forced sexual activity: Not on file  Other Topics Concern  . Not on file  Social History Narrative  . Not on file     Allergies  Allergen Reactions  . Other Shortness Of Breath    UNSPECIFIED AGENTS Allergic to perfumes and cleaning products  . Ace Inhibitors Cough and Other (See Comments)       . Shellfish Allergy Other (See Comments)    Per allergy test.  . Aspirin Nausea Only     Outpatient Medications Prior to Visit  Medication Sig Dispense Refill  . ACCU-CHEK SOFTCLIX LANCETS lancets USE AS INSTRUCTED 3 TIMES A DAY 100 each 12  . albuterol (PROVENTIL) (2.5 MG/3ML) 0.083% nebulizer solution Take 3 mLs (2.5 mg total) by nebulization every 6 (six) hours as needed for wheezing. 100 vial 6  . ALLERGY RELIEF 10 MG tablet TAKE 1 TABLET (10 MG TOTAL) BY MOUTH DAILY. 30 tablet 11  . allopurinol (ZYLOPRIM) 100 MG tablet TAKE 1 TABLET (100 MG TOTAL) BY MOUTH DAILY. 90 tablet 3  . Blood Glucose Monitoring Suppl (ACCU-CHEK AVIVA PLUS) w/Device KIT 1 each by Does not apply route 3 (three) times daily. 1 kit 0  . budesonide-formoterol (SYMBICORT) 80-4.5 MCG/ACT inhaler INHALE TWO PUFFS BY MOUTH TWICE A DAY (Patient taking differently: Inhale 2 puffs into the lungs 2 (two) times a day. ) 1 Inhaler 11  . celecoxib (CELEBREX) 200 MG capsule TAKE 1 CAPSULE (200 MG TOTAL) BY MOUTH 2 (TWO) TIMES DAILY. 180 capsule 3  . clopidogrel (PLAVIX) 75 MG tablet TAKE 1 TABLET (75 MG TOTAL) BY MOUTH DAILY. 30 tablet 2  . diclofenac sodium (VOLTAREN) 1 % GEL Apply 4 g topically 4 (four) times daily. 5 Tube 6  . docusate sodium (COLACE) 100 MG capsule Take 1  capsule (100 mg total) by mouth 2 (two) times daily. 10 capsule 0  . EPINEPHrine 0.3 mg/0.3 mL IJ SOAJ injection 0.3 mg IM x 1 PRN for allergic reaction (Patient taking differently: Inject 0.3 mg into the muscle as needed for anaphylaxis. ) 1 Device 1  . [name] 325 (65 Fe) MG tablet TAKE 1 TABLET BY MOUTH 2 (TWO) TIMES DAILY WITH A MEAL. 60 tablet   2  . fluticasone (FLONASE) 50 MCG/ACT nasal spray Place 1 spray into both nostrils daily. 16 g 6  . gabapentin (NEURONTIN) 600 MG tablet TAKE 1 TABLET (600 MG TOTAL) BY MOUTH 3 (THREE) TIMES DAILY. 90 tablet 1  . glimepiride (AMARYL) 2 MG tablet TAKE 1 TABLET (2 MG TOTAL) BY MOUTH DAILY. 30 tablet 2  . glucose blood (ACCU-CHEK AVIVA PLUS) test strip USE 3 TIMES A DAY AS DIRECTED BY PHYSICIAN 100 each 12  . hydrochlorothiazide (HYDRODIURIL) 25 MG tablet TAKE 1 TABLET (25 MG TOTAL) BY MOUTH DAILY. 90 tablet 3  . hydrOXYzine (ATARAX/VISTARIL) 10 MG tablet TAKE 1 TABLET IN THE MORNING AND NOON AND 2 TABLETS IN THE EVENING AS NEEDED 120 tablet 2  . Lancets (ACCU-CHEK SOFT TOUCH) lancets Use as instructed 100 each 12  . Melatonin 3 MG TABS Take 1 tablet (3 mg total) by mouth at bedtime as needed. (Patient taking differently: Take 3 mg by mouth at bedtime as needed (sleep). ) 30 tablet 3  . metaxalone (SKELAXIN) 800 MG tablet TAKE 1 TABLET (800 MG TOTAL) BY MOUTH 3 (THREE) TIMES DAILY. 30 tablet 1  . methocarbamol (ROBAXIN) 500 MG tablet Take 1 tablet (500 mg total) by mouth every 8 (eight) hours as needed for muscle spasms. 40 tablet 0  . MITIGARE 0.6 MG CAPS Take 1 capsule by mouth daily. 30 capsule 3  . montelukast (SINGULAIR) 10 MG tablet Take 10 mg by mouth at bedtime.     . Multiple Vitamin (MULTIVITAMIN WITH MINERALS) TABS tablet Take 1 tablet by mouth daily.    . naproxen (NAPROSYN) 500 MG tablet Take 1 tablet (500 mg total) by mouth 2 (two) times daily with a meal. 40 tablet 1  . nicotine (NICODERM CQ - DOSED IN MG/24 HOURS) 14 mg/24hr patch PLACE 1  PATCH ONTO THE SKIN DAILY 28 patch 2  . pantoprazole (PROTONIX) 40 MG tablet TAKE 1 TABLET (40 MG TOTAL) BY MOUTH DAILY. 30 tablet 2  . Potassium Chloride ER 20 MEQ TBCR Take 20 mEq by mouth daily. 30 tablet 6  . promethazine (PHENERGAN) 25 MG tablet Take 1 tablet (25 mg total) by mouth 2 (two) times daily as needed for nausea or vomiting. 20 tablet 0  . Respiratory Therapy Supplies (FLUTTER) DEVI Use after breathing treatment 4 times daily 1 each 0  . senna-docusate (SENOKOT S) 8.6-50 MG tablet Take 1 tablet by mouth daily as needed for mild constipation. 30 tablet 2  . simvastatin (ZOCOR) 20 MG tablet TAKE ONE TABLET BY MOUTH DAILY AT BEDTIME. 30 tablet 2  . sucralfate (CARAFATE) 1 g tablet Take 1 g by mouth 4 (four) times daily -  with meals and at bedtime.    . valsartan-hydrochlorothiazide (DIOVAN-HCT) 160-12.5 MG tablet TAKE ONE TABLET BY MOUTH ONCE DAILY 30 tablet 2  . tiotropium (SPIRIVA HANDIHALER) 18 MCG inhalation capsule Place 1 capsule (18 mcg total) into inhaler and inhale daily. 30 capsule 12  . cromolyn (OPTICROM) 4 % ophthalmic solution Place 1 drop into both eyes 4 (four) times daily.     . potassium chloride SA (KLOR-CON) 20 MEQ tablet TAKE ONE TABLET BY MOUTH ONCE DAILY (Patient not taking: Reported on 12/26/2018) 30 tablet 0   No facility-administered medications prior to visit.      Review of Systems  Constitutional: Negative for fever.  HENT: Positive for rhinorrhea. Negative for sore throat.   Respiratory: Positive for sputum production, shortness of breath and wheezing. Negative for hemoptysis.     Cardiovascular: Positive for orthopnea and PND. Negative for chest pain, leg swelling and syncope.  Neurological: Negative for headaches.       Objective:   Physical Exam Vitals:   12/26/18 1042  BP: 109/69  Pulse: 75  Temp: 98.2 F (36.8 C)  TempSrc: Oral  SpO2: 97%  Weight: 185 lb (83.9 kg)  Height: 5' 6" (1.676 m)    Gen: Pleasant, well-nourished, in no  distress,  normal affect  ENT: No lesions,  mouth clear,  oropharynx clear, no postnasal drip Small ulcerating lesion in the left ear  Neck: No JVD, no TMG, no carotid bruits  Lungs: No use of accessory muscles, no dullness to percussion, distant breath sounds however no wheezes are auscultated   cardiovascular: RRR, heart sounds normal, no murmur or gallops, no peripheral edema  Abdomen: soft and NT, no HSM,  BS normal  Musculoskeletal: No deformities, no cyanosis or clubbing  Neuro: alert, non focal  Skin: Warm, note lesion in the left ear     Assessment & Plan:  I personally reviewed all images and lab data in the [facility] system as well as any outside material available during this office visit and agree with the  radiology impressions.   Tobacco use disorder    . Current smoking consumption amount: 3cig/day  . Dicsussion on advise to quit smoking and smoking impacts:   Marland Kitchen Patient's willingness to quit:    . Methods to quit smoking discussed:    . Medication management of smoking session drugs discussed:  . Resources provided:  AVS   . Setting quit date  . Follow-up arranged   Time spent counseling the patient:      COPD (chronic obstructive pulmonary disease) Chronic obstructive lung disease with associated asthmatic component  Continue Symbicort and Spiriva as prescribed and focus on smoking cessation  Lesion of skin of left ear Lesion on the left ear for this will refer to dermatology this may be an early malignancy   Stacy [name] was seen today for follow-up.  Diagnoses and all orders for this visit:  Moderate persistent asthma without complication  Type 2 diabetes mellitus without complication, without long-term current use of insulin (HCC) -     Glucose (CBG)  Pulmonary emphysema, unspecified emphysema type (HCC)  Tobacco use disorder  Lesion of skin of left ear -     Ambulatory referral to Dermatology  Other orders -     tiotropium (SPIRIVA  HANDIHALER) 18 MCG inhalation capsule; Place 1 capsule (18 mcg total) into inhaler and inhale daily.

## 2018-12-26 ENCOUNTER — Ambulatory Visit (HOSPITAL_BASED_OUTPATIENT_CLINIC_OR_DEPARTMENT_OTHER): Payer: Medicaid Other | Admitting: Licensed Clinical Social Worker

## 2018-12-26 ENCOUNTER — Ambulatory Visit: Payer: Medicaid Other | Attending: Critical Care Medicine | Admitting: Critical Care Medicine

## 2018-12-26 ENCOUNTER — Other Ambulatory Visit: Payer: Self-pay

## 2018-12-26 ENCOUNTER — Encounter: Payer: Self-pay | Admitting: Critical Care Medicine

## 2018-12-26 VITALS — BP 109/69 | HR 75 | Temp 98.2°F | Ht 66.0 in | Wt 185.0 lb

## 2018-12-26 DIAGNOSIS — M109 Gout, unspecified: Secondary | ICD-10-CM | POA: Diagnosis not present

## 2018-12-26 DIAGNOSIS — F17210 Nicotine dependence, cigarettes, uncomplicated: Secondary | ICD-10-CM | POA: Diagnosis not present

## 2018-12-26 DIAGNOSIS — H61892 Other specified disorders of left external ear: Secondary | ICD-10-CM

## 2018-12-26 DIAGNOSIS — Z7984 Long term (current) use of oral hypoglycemic drugs: Secondary | ICD-10-CM | POA: Insufficient documentation

## 2018-12-26 DIAGNOSIS — E119 Type 2 diabetes mellitus without complications: Secondary | ICD-10-CM | POA: Diagnosis not present

## 2018-12-26 DIAGNOSIS — K589 Irritable bowel syndrome without diarrhea: Secondary | ICD-10-CM | POA: Insufficient documentation

## 2018-12-26 DIAGNOSIS — Z560 Unemployment, unspecified: Secondary | ICD-10-CM | POA: Diagnosis not present

## 2018-12-26 DIAGNOSIS — E785 Hyperlipidemia, unspecified: Secondary | ICD-10-CM | POA: Diagnosis not present

## 2018-12-26 DIAGNOSIS — I10 Essential (primary) hypertension: Secondary | ICD-10-CM | POA: Insufficient documentation

## 2018-12-26 DIAGNOSIS — J449 Chronic obstructive pulmonary disease, unspecified: Secondary | ICD-10-CM | POA: Insufficient documentation

## 2018-12-26 DIAGNOSIS — H6192 Disorder of left external ear, unspecified: Secondary | ICD-10-CM | POA: Insufficient documentation

## 2018-12-26 DIAGNOSIS — J439 Emphysema, unspecified: Secondary | ICD-10-CM

## 2018-12-26 DIAGNOSIS — Z7951 Long term (current) use of inhaled steroids: Secondary | ICD-10-CM | POA: Diagnosis not present

## 2018-12-26 DIAGNOSIS — J4540 Moderate persistent asthma, uncomplicated: Secondary | ICD-10-CM

## 2018-12-26 DIAGNOSIS — Z7902 Long term (current) use of antithrombotics/antiplatelets: Secondary | ICD-10-CM | POA: Diagnosis not present

## 2018-12-26 DIAGNOSIS — J3081 Allergic rhinitis due to animal (cat) (dog) hair and dander: Secondary | ICD-10-CM | POA: Diagnosis not present

## 2018-12-26 DIAGNOSIS — K219 Gastro-esophageal reflux disease without esophagitis: Secondary | ICD-10-CM | POA: Insufficient documentation

## 2018-12-26 DIAGNOSIS — L989 Disorder of the skin and subcutaneous tissue, unspecified: Secondary | ICD-10-CM | POA: Diagnosis not present

## 2018-12-26 DIAGNOSIS — Z8249 Family history of ischemic heart disease and other diseases of the circulatory system: Secondary | ICD-10-CM | POA: Diagnosis not present

## 2018-12-26 DIAGNOSIS — G2581 Restless legs syndrome: Secondary | ICD-10-CM | POA: Insufficient documentation

## 2018-12-26 DIAGNOSIS — F17200 Nicotine dependence, unspecified, uncomplicated: Secondary | ICD-10-CM

## 2018-12-26 DIAGNOSIS — J3089 Other allergic rhinitis: Secondary | ICD-10-CM | POA: Diagnosis not present

## 2018-12-26 DIAGNOSIS — J301 Allergic rhinitis due to pollen: Secondary | ICD-10-CM | POA: Diagnosis not present

## 2018-12-26 DIAGNOSIS — H409 Unspecified glaucoma: Secondary | ICD-10-CM | POA: Diagnosis not present

## 2018-12-26 DIAGNOSIS — Z791 Long term (current) use of non-steroidal anti-inflammatories (NSAID): Secondary | ICD-10-CM | POA: Diagnosis not present

## 2018-12-26 DIAGNOSIS — E1140 Type 2 diabetes mellitus with diabetic neuropathy, unspecified: Secondary | ICD-10-CM | POA: Diagnosis not present

## 2018-12-26 DIAGNOSIS — Z79899 Other long term (current) drug therapy: Secondary | ICD-10-CM | POA: Diagnosis not present

## 2018-12-26 DIAGNOSIS — M1990 Unspecified osteoarthritis, unspecified site: Secondary | ICD-10-CM | POA: Diagnosis not present

## 2018-12-26 LAB — GLUCOSE, POCT (MANUAL RESULT ENTRY): POC Glucose: 79 mg/dl (ref 70–99)

## 2018-12-26 MED ORDER — SPIRIVA HANDIHALER 18 MCG IN CAPS: 18.0000 ug | ORAL_CAPSULE | Freq: Every day | RESPIRATORY_TRACT | 12 refills | Status: DC

## 2018-12-26 NOTE — Assessment & Plan Note (Signed)
Lesion on the left ear for this will refer to dermatology this may be an early malignancy

## 2018-12-26 NOTE — BH Specialist Note (Signed)
Integrated Behavioral Health Initial Visit  MRN: [id number] Name: Lylie Blacklock [facility]  Number of [place] Clinician visits:: 1/6 Session Start time: 11:16  Session End time: 11:31 Total time: 15  Type of Service: [place] Interpretor:No. Interpretor Name and Language: N/A   Warm Hand Off Completed.       SUBJECTIVE: Stacy [name] is a 58 y.o. female accompanied by self Patient was referred by Dr. Joya Gaskins for smoking cessation. Patient reports the following symptoms/concerns: difficulty quitting smoking Duration of problem: ongoing; Severity of problem: moderate  OBJECTIVE: Mood: NA and Affect: Appropriate Risk of harm to self or others: No plan to harm self or others  LIFE CONTEXT: Family and Social: Pt reports she has family in [place]. She has 5 grandchildren.  School/Work: Pt reports she lives in 2 story residence where steps to reach the bathtub are difficult to manage Self-Care: Pt drinks water and keeps her hands busy with puzzles. Pt enjoys cooking. Pt reports she has smoked cigarettes most of her life.  Life Changes: Pt reports interest in smoking cessation. She has decreased from 8 cigarettes per day to 3 per day recently.  GOALS ADDRESSED: Patient will: 1. Reduce symptoms of: smoking tobacco  2. Increase knowledge and/or ability of: healthy habits  3. Demonstrate ability to: Increase adequate support systems for patient/family  INTERVENTIONS: Interventions utilized: Motivational Interviewing and Solution-Focused Strategies  Standardized Assessments completed: Not Needed  ASSESSMENT: Patient currently experiencing continued use of tobacco products despite health conditions. Pt reports she is motivated to quit because of her grandchildren. MSW Intern encouraged pt to choose a quit date and contact [place] Quitline to increase coping skills and obtain cessation products. Pt reports she completes puzzles and  plays puzzle book games on her phone to manage cravings.   Patient may benefit from smoking cessation. [place] Quitline brochure provided to pt and services offered were summarized.   PLAN: 1. Follow up with behavioral health clinician on : Patient was encouraged to contact MSW intern if she is in need of additional support regarding smoking cessation or other resource/behavioral health needs. 2. Behavioral recommendations: Call [place] Quitline, choose a quit date, use coping skills discussed in session to manage cravings. 3. Referral(s): Community Resources:  Smoking Cessation Quitline 4. "From scale of 1-10, how likely are you to follow plan?": Pt reported she would call quitline today.   Berniece Salines MSW Intern  12/26/2018  1:48pm

## 2018-12-26 NOTE — Assessment & Plan Note (Addendum)
Chronic obstructive lung disease with associated asthmatic component  Continue Symbicort and Spiriva as prescribed and focus on smoking cessation

## 2018-12-26 NOTE — Assessment & Plan Note (Signed)
  .   Current smoking consumption amount: 3cig/day  . Dicsussion on advise to quit smoking and smoking impacts:   Marland Kitchen Patient's willingness to quit:    . Methods to quit smoking discussed:    . Medication management of smoking session drugs discussed:  . Resources provided:  AVS   . Setting quit date  . Follow-up arranged   Time spent counseling the patient:

## 2018-12-26 NOTE — Patient Instructions (Signed)
No change in medications  Refill on Spiriva given  Work on smoking cessation   Return to see Dr. Delford Field in 4 months   Coping with Quitting Smoking  Quitting smoking is a physical and mental challenge. You will face cravings, withdrawal symptoms, and temptation. Before quitting, work with your health care provider to make a plan that can help you cope. Preparation can help you quit and keep you from giving in. How can I cope with cravings? Cravings usually last for 5-10 minutes. If you get through it, the craving will pass. Consider taking the following actions to help you cope with cravings:  Keep your mouth busy: ? Chew sugar-free gum. ? Suck on hard candies or a straw. ? Brush your teeth.  Keep your hands and body busy: ? Immediately change to a different activity when you feel a craving. ? Squeeze or play with a ball. ? Do an activity or a hobby, like making bead jewelry, practicing needlepoint, or working with wood. ? Mix up your normal routine. ? Take a short exercise break. Go for a quick walk or run up and down stairs. ? Spend time in public places where smoking is not allowed.  Focus on doing something kind or helpful for someone else.  Call a friend or family member to talk during a craving.  Join a support group.  Call a quit line, such as 1-800-QUIT-NOW.  Talk with your health care provider about medicines that might help you cope with cravings and make quitting easier for you. How can I deal with withdrawal symptoms? Your body may experience negative effects as it tries to get used to not having nicotine in the system. These effects are called withdrawal symptoms. They may include:  Feeling hungrier than normal.  Trouble concentrating.  Irritability.  Trouble sleeping.  Feeling depressed.  Restlessness and agitation.  Craving a cigarette. To manage withdrawal symptoms:  Avoid places, people, and activities that trigger your cravings.  Remember why  you want to quit.  Get plenty of sleep.  Avoid coffee and other caffeinated drinks. These may worsen some of your symptoms. How can I handle social situations? Social situations can be difficult when you are quitting smoking, especially in the first few weeks. To manage this, you can:  Avoid parties, bars, and other social situations where people might be smoking.  Avoid alcohol.  Leave right away if you have the urge to smoke.  Explain to your family and friends that you are quitting smoking. Ask for understanding and support.  Plan activities with friends or family where smoking is not an option. What are some ways I can cope with stress? Wanting to smoke may cause stress, and stress can make you want to smoke. Find ways to manage your stress. Relaxation techniques can help. For example:  Breathe slowly and deeply, in through your nose and out through your mouth.  Listen to soothing, relaxing music.  Talk with a family member or friend about your stress.  Light a candle.  Soak in a bath or take a shower.  Think about a peaceful place. What are some ways I can prevent weight gain? Be aware that many people gain weight after they quit smoking. However, not everyone does. To keep from gaining weight, have a plan in place before you quit and stick to the plan after you quit. Your plan should include:  Having healthy snacks. When you have a craving, it may help to: ? Eat plain popcorn, crunchy carrots,  celery, or other cut vegetables. ? Chew sugar-free gum.  Changing how you eat: ? Eat small portion sizes at meals. ? Eat 4-6 small meals throughout the day instead of 1-2 large meals a day. ? Be mindful when you eat. Do not watch television or do other things that might distract you as you eat.  Exercising regularly: ? Make time to exercise each day. If you do not have time for a long workout, do short bouts of exercise for 5-10 minutes several times a day. ? Do some form of  strengthening exercise, like weight lifting, and some form of aerobic exercise, like running or swimming.  Drinking plenty of water or other low-calorie or no-calorie drinks. Drink 6-8 glasses of water daily, or as much as instructed by your health care provider. Summary  Quitting smoking is a physical and mental challenge. You will face cravings, withdrawal symptoms, and temptation to smoke again. Preparation can help you as you go through these challenges.  You can cope with cravings by keeping your mouth busy (such as by chewing gum), keeping your body and hands busy, and making calls to family, friends, or a helpline for people who want to quit smoking.  You can cope with withdrawal symptoms by avoiding places where people smoke, avoiding drinks with caffeine, and getting plenty of rest.  Ask your health care provider about the different ways to prevent weight gain, avoid stress, and handle social situations. This information is not intended to replace advice given to you by your health care provider. Make sure you discuss any questions you have with your health care provider. Document Released: 01/17/2016 Document Revised: 01/01/2017 Document Reviewed: 01/17/2016 Elsevier Patient Education  2020 [employer].

## 2018-12-28 ENCOUNTER — Other Ambulatory Visit: Payer: Self-pay

## 2018-12-28 ENCOUNTER — Ambulatory Visit (INDEPENDENT_AMBULATORY_CARE_PROVIDER_SITE_OTHER): Payer: Medicaid Other | Admitting: Orthopaedic Surgery

## 2018-12-28 ENCOUNTER — Encounter: Payer: Self-pay | Admitting: Orthopaedic Surgery

## 2018-12-28 DIAGNOSIS — F411 Generalized anxiety disorder: Secondary | ICD-10-CM | POA: Diagnosis not present

## 2018-12-28 DIAGNOSIS — M1612 Unilateral primary osteoarthritis, left hip: Secondary | ICD-10-CM | POA: Diagnosis not present

## 2018-12-28 MED ORDER — TRAMADOL HCL 50 MG PO TABS: 50.0000 mg | ORAL_TABLET | Freq: Four times a day (QID) | ORAL | 0 refills | Status: DC | PRN

## 2018-12-28 MED ORDER — METHOCARBAMOL 750 MG PO TABS: 750.0000 mg | ORAL_TABLET | Freq: Four times a day (QID) | ORAL | 0 refills | Status: DC | PRN

## 2018-12-28 NOTE — Progress Notes (Signed)
The patient is following up after an intra-articular injection into her left hip to hopefully treat the pain that she is experiencing of her osteoarthritis in that hip. The injection only helped for short amount of time. She still reports severe left hip pain. A MRI of her hip did show arthritic changes in that left hip. She does ambulate with a rolling walker. She is only 58 years old. At this point her left hip pain is debilitating. It is detrimentally affecting her actives daily living, her quality of life and her mobility. She does wish at this point to proceed with hip replacement surgery.  On examination of her left hip she has significant guarding and pain with internal and external rotation of that left hip.  I did go over hip model with her and explained in detail what hip replacement surgery involves. She has tried and failed conservative treatment for now over a year dealing with her left hip and is gotten significantly worse. Injections have provided no significant relief at all. She is not obese. She is worked on activity modification and does take anti-inflammatories. At this point given the severity of her pain and the impact that is had negatively on her life she would like to proceed with hip replacement surgery and I agree with this as well. She has no other acute medical issues right now. We will work on getting surgery scheduled. All question concerns were answered and addressed.

## 2019-01-02 DIAGNOSIS — J301 Allergic rhinitis due to pollen: Secondary | ICD-10-CM | POA: Diagnosis not present

## 2019-01-02 DIAGNOSIS — J3089 Other allergic rhinitis: Secondary | ICD-10-CM | POA: Diagnosis not present

## 2019-01-02 DIAGNOSIS — J3081 Allergic rhinitis due to animal (cat) (dog) hair and dander: Secondary | ICD-10-CM | POA: Diagnosis not present

## 2019-01-06 DIAGNOSIS — J3081 Allergic rhinitis due to animal (cat) (dog) hair and dander: Secondary | ICD-10-CM | POA: Diagnosis not present

## 2019-01-06 DIAGNOSIS — J3089 Other allergic rhinitis: Secondary | ICD-10-CM | POA: Diagnosis not present

## 2019-01-06 DIAGNOSIS — J301 Allergic rhinitis due to pollen: Secondary | ICD-10-CM | POA: Diagnosis not present

## 2019-01-09 DIAGNOSIS — J3089 Other allergic rhinitis: Secondary | ICD-10-CM | POA: Diagnosis not present

## 2019-01-09 DIAGNOSIS — J3081 Allergic rhinitis due to animal (cat) (dog) hair and dander: Secondary | ICD-10-CM | POA: Diagnosis not present

## 2019-01-09 DIAGNOSIS — J301 Allergic rhinitis due to pollen: Secondary | ICD-10-CM | POA: Diagnosis not present

## 2019-01-11 DIAGNOSIS — J3081 Allergic rhinitis due to animal (cat) (dog) hair and dander: Secondary | ICD-10-CM | POA: Diagnosis not present

## 2019-01-11 DIAGNOSIS — J301 Allergic rhinitis due to pollen: Secondary | ICD-10-CM | POA: Diagnosis not present

## 2019-01-11 DIAGNOSIS — J3089 Other allergic rhinitis: Secondary | ICD-10-CM | POA: Diagnosis not present

## 2019-01-12 DIAGNOSIS — F411 Generalized anxiety disorder: Secondary | ICD-10-CM | POA: Diagnosis not present

## 2019-01-13 ENCOUNTER — Other Ambulatory Visit: Payer: Self-pay | Admitting: Internal Medicine

## 2019-01-13 ENCOUNTER — Other Ambulatory Visit (HOSPITAL_COMMUNITY)
Admission: RE | Admit: 2019-01-13 | Discharge: 2019-01-13 | Disposition: A | Discharge status: Home / Self Care | Payer: Medicaid Other | Source: Ambulatory Visit | Attending: Obstetrics and Gynecology | Admitting: Obstetrics and Gynecology

## 2019-01-13 ENCOUNTER — Ambulatory Visit (INDEPENDENT_AMBULATORY_CARE_PROVIDER_SITE_OTHER): Payer: Medicaid Other | Admitting: Obstetrics and Gynecology

## 2019-01-13 ENCOUNTER — Other Ambulatory Visit: Payer: Self-pay | Admitting: Specialist

## 2019-01-13 ENCOUNTER — Other Ambulatory Visit: Payer: Self-pay | Admitting: Physical Medicine & Rehabilitation

## 2019-01-13 ENCOUNTER — Other Ambulatory Visit: Payer: Self-pay

## 2019-01-13 ENCOUNTER — Encounter: Payer: Self-pay | Admitting: Obstetrics and Gynecology

## 2019-01-13 VITALS — BP 112/67 | HR 92 | Ht 66.0 in | Wt 181.9 lb

## 2019-01-13 DIAGNOSIS — N898 Other specified noninflammatory disorders of vagina: Secondary | ICD-10-CM | POA: Insufficient documentation

## 2019-01-13 DIAGNOSIS — B977 Papillomavirus as the cause of diseases classified elsewhere: Secondary | ICD-10-CM

## 2019-01-13 DIAGNOSIS — N870 Mild cervical dysplasia: Secondary | ICD-10-CM | POA: Diagnosis not present

## 2019-01-13 DIAGNOSIS — F17200 Nicotine dependence, unspecified, uncomplicated: Secondary | ICD-10-CM

## 2019-01-13 LAB — POCT PREGNANCY, URINE: Preg Test, Ur: NEGATIVE

## 2019-01-13 NOTE — Patient Instructions (Signed)
Colposcopy, Care After This sheet gives you information about how to care for yourself after your procedure. Your doctor may also give you more specific instructions. If you have problems or questions, contact your doctor. What can I expect after the procedure? If you did not have a tissue sample removed (did not have a biopsy), you may only have some spotting for a few days. You can go back to your normal activities. If you had a tissue sample removed, it is common to have:  Soreness and pain. This may last for a few days.  Light-headedness.  Mild bleeding from your vagina or dark-colored, grainy discharge from your vagina. This may last for a few days. You may need to wear a sanitary pad.  Spotting for at least 48 hours after the procedure. Follow these instructions at home:   Take over-the-counter and prescription medicines only as told by your doctor. Ask your doctor what medicines you can start taking again. This is very important if you take blood-thinning medicine.  Do not drive or use heavy machinery while taking prescription pain medicine.  For 3 days, or as long as your doctor tells you, avoid: ? Douching. ? Using tampons. ? Having sex.  If you use birth control (contraception), keep using it.  Limit activity for the first day after the procedure. Ask your doctor what activities are safe for you.  It is up to you to get the results of your procedure. Ask your doctor when your results will be ready.  Keep all follow-up visits as told by your doctor. This is important. Contact a doctor if:  You get a skin rash. Get help right away if:  You are bleeding a lot from your vagina. It is a lot of bleeding if you are using more than one pad an hour for 2 hours in a row.  You have clumps of blood (blood clots) coming from your vagina.  You have a fever.  You have chills  You have pain in your lower belly (pelvic area).  You have signs of infection, such as vaginal  discharge that is: ? Different than usual. ? Yellow. ? Bad-smelling.  You have very pain or cramps in your lower belly that do not get better with medicine.  You feel light-headed.  You feel dizzy.  You pass out (faint). Summary  If you did not have a tissue sample removed (did not have a biopsy), you may only have some spotting for a few days. You can go back to your normal activities.  If you had a tissue sample removed, it is common to have mild pain and spotting for 48 hours.  For 3 days, or as long as your doctor tells you, avoid douching, using tampons and having sex.  Get help right away if you have bleeding, very bad pain, or signs of infection. This information is not intended to replace advice given to you by your health care provider. Make sure you discuss any questions you have with your health care provider. Document Released: 07/08/2007 Document Revised: 01/01/2017 Document Reviewed: 10/09/2015 Elsevier Patient Education  2020 Elsevier Inc.  

## 2019-01-13 NOTE — Progress Notes (Signed)
    GYNECOLOGY CLINIC COLPOSCOPY PROCEDURE NOTE  58 y.o. G2P0 here for colposcopy for normal cytology, HPV 16 + pap smear on 10/2018. Discussed role for HPV in cervical dysplasia, need for surveillance.  Patient given informed consent, signed copy in the chart, time out was performed.  Placed in lithotomy position. Cervix viewed with speculum and colposcope after application of acetic acid.   Colposcopy adequate? Yes  no visible lesions; random Bx obtained  ECC specimen obtained. All specimens were labelled and sent to pathology.Monsel's applied, hemostasis  noted Patient was given post procedure instructions.  Will follow up pathology and manage accordingly.  Routine preventative health maintenance measures emphasized.     Arlina Robes, MD, [place] Attending [place] for [place]

## 2019-01-13 NOTE — Addendum Note (Signed)
Addended by: Bethanne Ginger on: 01/13/2019 11:03 AM   Modules accepted: Orders

## 2019-01-16 DIAGNOSIS — J3089 Other allergic rhinitis: Secondary | ICD-10-CM | POA: Diagnosis not present

## 2019-01-16 DIAGNOSIS — H60392 Other infective otitis externa, left ear: Secondary | ICD-10-CM | POA: Diagnosis not present

## 2019-01-16 DIAGNOSIS — J301 Allergic rhinitis due to pollen: Secondary | ICD-10-CM | POA: Diagnosis not present

## 2019-01-16 DIAGNOSIS — H6192 Disorder of left external ear, unspecified: Secondary | ICD-10-CM | POA: Diagnosis not present

## 2019-01-16 DIAGNOSIS — J3081 Allergic rhinitis due to animal (cat) (dog) hair and dander: Secondary | ICD-10-CM | POA: Diagnosis not present

## 2019-01-16 LAB — CERVICOVAGINAL ANCILLARY ONLY
Bacterial Vaginitis (gardnerella): NEGATIVE
Candida Glabrata: NEGATIVE
Candida Vaginitis: POSITIVE — AB
Chlamydia: NEGATIVE
Comment: NEGATIVE
Comment: NEGATIVE
Comment: NEGATIVE
Comment: NEGATIVE
Comment: NEGATIVE
Comment: NORMAL
Neisseria Gonorrhea: NEGATIVE
Trichomonas: NEGATIVE

## 2019-01-17 ENCOUNTER — Other Ambulatory Visit: Payer: Self-pay

## 2019-01-17 DIAGNOSIS — N898 Other specified noninflammatory disorders of vagina: Secondary | ICD-10-CM

## 2019-01-17 LAB — SURGICAL PATHOLOGY

## 2019-01-17 MED ORDER — FLUCONAZOLE 150 MG PO TABS: 150.0000 mg | ORAL_TABLET | Freq: Once | ORAL | 1 refills | Status: AC

## 2019-01-17 NOTE — Progress Notes (Signed)
Per Dr.Ervin Sending Diflucan plus 1 refill to patient pharmacy.

## 2019-01-18 ENCOUNTER — Telehealth: Payer: Self-pay | Admitting: General Practice

## 2019-01-18 DIAGNOSIS — B379 Candidiasis, unspecified: Secondary | ICD-10-CM

## 2019-01-18 MED ORDER — FLUCONAZOLE 150 MG PO TABS: ORAL_TABLET | ORAL | 0 refills | Status: DC

## 2019-01-18 NOTE — Telephone Encounter (Signed)
Patient called into front office stating she was supposed to have prescriptions sent to her pharmacy but they aren't there. Per chart review, diflucan was supposed to be sent in yesterday. Diflucan sent to pharmacy. Called & informed patient. Patient verbalized understanding & had no questions.

## 2019-01-19 ENCOUNTER — Other Ambulatory Visit: Payer: Self-pay

## 2019-01-19 ENCOUNTER — Ambulatory Visit (HOSPITAL_COMMUNITY)
Admission: RE | Admit: 2019-01-19 | Discharge: 2019-01-19 | Disposition: A | Discharge status: Home / Self Care | Payer: Medicaid Other | Source: Ambulatory Visit | Attending: Ophthalmology | Admitting: Ophthalmology

## 2019-01-19 ENCOUNTER — Other Ambulatory Visit (HOSPITAL_COMMUNITY): Payer: Self-pay | Admitting: Ophthalmology

## 2019-01-19 DIAGNOSIS — F411 Generalized anxiety disorder: Secondary | ICD-10-CM | POA: Diagnosis not present

## 2019-01-19 DIAGNOSIS — Z01818 Encounter for other preprocedural examination: Secondary | ICD-10-CM | POA: Diagnosis not present

## 2019-01-19 DIAGNOSIS — Z01811 Encounter for preprocedural respiratory examination: Secondary | ICD-10-CM

## 2019-01-19 DIAGNOSIS — H04559 Acquired stenosis of unspecified nasolacrimal duct: Secondary | ICD-10-CM | POA: Diagnosis not present

## 2019-01-19 DIAGNOSIS — E119 Type 2 diabetes mellitus without complications: Secondary | ICD-10-CM | POA: Diagnosis not present

## 2019-01-19 DIAGNOSIS — H04223 Epiphora due to insufficient drainage, bilateral lacrimal glands: Secondary | ICD-10-CM | POA: Diagnosis not present

## 2019-01-23 DIAGNOSIS — J3081 Allergic rhinitis due to animal (cat) (dog) hair and dander: Secondary | ICD-10-CM | POA: Diagnosis not present

## 2019-01-23 DIAGNOSIS — J3089 Other allergic rhinitis: Secondary | ICD-10-CM | POA: Diagnosis not present

## 2019-01-23 DIAGNOSIS — J301 Allergic rhinitis due to pollen: Secondary | ICD-10-CM | POA: Diagnosis not present

## 2019-01-26 DIAGNOSIS — F411 Generalized anxiety disorder: Secondary | ICD-10-CM | POA: Diagnosis not present

## 2019-01-31 ENCOUNTER — Other Ambulatory Visit: Payer: Self-pay | Admitting: Internal Medicine

## 2019-01-31 DIAGNOSIS — E119 Type 2 diabetes mellitus without complications: Secondary | ICD-10-CM

## 2019-02-01 DIAGNOSIS — L988 Other specified disorders of the skin and subcutaneous tissue: Secondary | ICD-10-CM | POA: Diagnosis not present

## 2019-02-02 DIAGNOSIS — F411 Generalized anxiety disorder: Secondary | ICD-10-CM | POA: Diagnosis not present

## 2019-02-04 ENCOUNTER — Encounter: Payer: Self-pay | Admitting: Internal Medicine

## 2019-02-04 NOTE — Progress Notes (Signed)
I received note from Dr. Carman Ching of [facility] gastroenterology section.  Patient seen in 12/22/2018 for follow-up of nausea and gastroparesis thought to be due to diabetes and chronic narcotic use.  Patient he said is stable.  She will be due for screening colonoscopy in 1 year.

## 2019-02-06 DIAGNOSIS — J301 Allergic rhinitis due to pollen: Secondary | ICD-10-CM | POA: Diagnosis not present

## 2019-02-06 DIAGNOSIS — J3081 Allergic rhinitis due to animal (cat) (dog) hair and dander: Secondary | ICD-10-CM | POA: Diagnosis not present

## 2019-02-06 DIAGNOSIS — J3089 Other allergic rhinitis: Secondary | ICD-10-CM | POA: Diagnosis not present

## 2019-02-07 DIAGNOSIS — L988 Other specified disorders of the skin and subcutaneous tissue: Secondary | ICD-10-CM | POA: Diagnosis not present

## 2019-02-08 ENCOUNTER — Encounter: Payer: Self-pay | Admitting: Orthopaedic Surgery

## 2019-02-08 ENCOUNTER — Ambulatory Visit (INDEPENDENT_AMBULATORY_CARE_PROVIDER_SITE_OTHER): Payer: Medicaid Other | Admitting: Orthopaedic Surgery

## 2019-02-08 ENCOUNTER — Other Ambulatory Visit: Payer: Self-pay

## 2019-02-08 VITALS — Ht 66.0 in | Wt 181.0 lb

## 2019-02-08 DIAGNOSIS — M1612 Unilateral primary osteoarthritis, left hip: Secondary | ICD-10-CM | POA: Diagnosis not present

## 2019-02-08 NOTE — Progress Notes (Signed)
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  She is gotten the point where has become debilitating.  She ambulates with a rolling walker.  She has had intra-articular injections in the left hip.  She has plain films and an MRI of the well-documented the arthritis of her left hip.  At this point she does wish proceed with total hip arthroplasty surgery.  We had seen her in the fall of last year and had recommended the surgery.  She was either lost to follow-up or somehow we did not manage to get this scheduled.  She has had no other acute change in her medical status.  She is a diabetic and her last hemoglobin A1c was 7.3.  She is a smoker as well.  On examination her left hip has severe pain with internal and external rotation.  Her x-rays and MRI do confirm severe arthritis.  I have given her handout before about anterior placement surgery.  I did talk to her again in detail well about what the surgery involves including the risk and benefits of surgery.  We talked about her interoperative and postoperative course.  She understands this may be difficult to schedule in light of the coronavirus pandemic.  She is someone that I would not feel comfortable sending home as a same-day surgery given her comorbidities.  She would need to stay at least a night minimum.  All question concerns were answered and addressed.  We will see about getting this scheduled.  She will continue to work on better blood glucose control in the interim.

## 2019-02-09 DIAGNOSIS — F411 Generalized anxiety disorder: Secondary | ICD-10-CM | POA: Diagnosis not present

## 2019-02-13 ENCOUNTER — Other Ambulatory Visit: Payer: Self-pay | Admitting: Orthopaedic Surgery

## 2019-02-13 ENCOUNTER — Telehealth: Payer: Self-pay

## 2019-02-13 DIAGNOSIS — J3081 Allergic rhinitis due to animal (cat) (dog) hair and dander: Secondary | ICD-10-CM | POA: Diagnosis not present

## 2019-02-13 DIAGNOSIS — J301 Allergic rhinitis due to pollen: Secondary | ICD-10-CM | POA: Diagnosis not present

## 2019-02-13 DIAGNOSIS — J3089 Other allergic rhinitis: Secondary | ICD-10-CM | POA: Diagnosis not present

## 2019-02-13 MED ORDER — TRAMADOL HCL 50 MG PO TABS: 50.0000 mg | ORAL_TABLET | Freq: Four times a day (QID) | ORAL | 0 refills | Status: DC | PRN

## 2019-02-13 NOTE — Telephone Encounter (Signed)
I will send in some tramadol.  That really all that we will put her on while we wait to reschedule her surgery.

## 2019-02-13 NOTE — Telephone Encounter (Signed)
Please advise 

## 2019-02-13 NOTE — Telephone Encounter (Signed)
Surgery canceled on 02/28/19 due to COVID elective surgery delay.  Can you prescribe something for pain?  She uses Pharmacologist.

## 2019-02-13 NOTE — Telephone Encounter (Signed)
Patient aware of the below message  

## 2019-02-15 DIAGNOSIS — L930 Discoid lupus erythematosus: Secondary | ICD-10-CM | POA: Diagnosis not present

## 2019-02-15 DIAGNOSIS — L259 Unspecified contact dermatitis, unspecified cause: Secondary | ICD-10-CM | POA: Diagnosis not present

## 2019-02-16 ENCOUNTER — Telehealth: Payer: Self-pay | Admitting: Internal Medicine

## 2019-02-16 ENCOUNTER — Telehealth: Payer: Self-pay

## 2019-02-16 DIAGNOSIS — F411 Generalized anxiety disorder: Secondary | ICD-10-CM | POA: Diagnosis not present

## 2019-02-16 DIAGNOSIS — J3089 Other allergic rhinitis: Secondary | ICD-10-CM | POA: Diagnosis not present

## 2019-02-16 DIAGNOSIS — J301 Allergic rhinitis due to pollen: Secondary | ICD-10-CM | POA: Diagnosis not present

## 2019-02-16 DIAGNOSIS — J3081 Allergic rhinitis due to animal (cat) (dog) hair and dander: Secondary | ICD-10-CM | POA: Diagnosis not present

## 2019-02-16 DIAGNOSIS — J449 Chronic obstructive pulmonary disease, unspecified: Secondary | ICD-10-CM | POA: Diagnosis not present

## 2019-02-16 NOTE — Telephone Encounter (Signed)
Patient called triage phone stating she was scheduled for surgery but her surgery was cancelled. She said that her hip is really hurting and is 'giving out on her'.  She said that it was making her fall. States that she even fell today while at the [employer] with her mom and someone had to help her up. She would like to know what she is supposed to do about this until she can have surgery.  Contact for patient [phone]

## 2019-02-16 NOTE — Telephone Encounter (Signed)
Patient called with stomach issues

## 2019-02-16 NOTE — Telephone Encounter (Signed)
Name and DOB verified.   Advised patient to try sips of Gatorade or G-2 sports drink and ice chips since she is unable to keep anything down. She admits to diarrhea episodes. She does not think she has had exposure to COVID. Patient advised to monitor blood sugars and go to ED or UC if N/V/D get worse.  Has not tried Immodium or pepto bismol.

## 2019-02-16 NOTE — Telephone Encounter (Signed)
Unfortunately there is really nothing that we can do.  Given the coronavirus pandemic and the increased number of patients that are being hospitalized, the health system will now not allow [place] to perform any inpatient total joint surgeries.  Our hands are tied from that standpoint.  Hopefully by March or April will be able to have her back on the operating schedule.  In the meantime, the only thing that she can really do is try to limit her mobility and to ambulate using a cane or walker.  There is really nothing else we can offer until we know more in terms of when the hospital allows [place] to perform these types of surgeries again.

## 2019-02-17 DIAGNOSIS — L930 Discoid lupus erythematosus: Secondary | ICD-10-CM | POA: Diagnosis not present

## 2019-02-17 NOTE — Telephone Encounter (Signed)
LM for patient to [facility] to discuss. °

## 2019-02-22 ENCOUNTER — Other Ambulatory Visit: Payer: Self-pay

## 2019-02-22 ENCOUNTER — Other Ambulatory Visit (HOSPITAL_COMMUNITY)
Admission: RE | Admit: 2019-02-22 | Discharge: 2019-02-22 | Disposition: A | Discharge status: Home / Self Care | Payer: Medicaid Other | Source: Ambulatory Visit | Attending: Family Medicine | Admitting: Family Medicine

## 2019-02-22 ENCOUNTER — Ambulatory Visit (INDEPENDENT_AMBULATORY_CARE_PROVIDER_SITE_OTHER): Payer: Medicaid Other | Admitting: *Deleted

## 2019-02-22 DIAGNOSIS — N898 Other specified noninflammatory disorders of vagina: Secondary | ICD-10-CM | POA: Diagnosis not present

## 2019-02-22 DIAGNOSIS — R300 Dysuria: Secondary | ICD-10-CM | POA: Diagnosis not present

## 2019-02-22 LAB — POCT URINALYSIS DIP (DEVICE)
Bilirubin Urine: NEGATIVE
Glucose, UA: NEGATIVE mg/dL
Hgb urine dipstick: NEGATIVE
Ketones, ur: NEGATIVE mg/dL
Leukocytes,Ua: NEGATIVE
Nitrite: NEGATIVE
Protein, ur: NEGATIVE mg/dL
Specific Gravity, Urine: 1.02 (ref 1.005–1.030)
Urobilinogen, UA: 0.2 mg/dL (ref 0.0–1.0)
pH: 5.5 (ref 5.0–8.0)

## 2019-02-23 ENCOUNTER — Ambulatory Visit (INDEPENDENT_AMBULATORY_CARE_PROVIDER_SITE_OTHER): Payer: Medicaid Other | Admitting: Specialist

## 2019-02-23 ENCOUNTER — Encounter: Payer: Self-pay | Admitting: Specialist

## 2019-02-23 ENCOUNTER — Encounter: Payer: Medicaid Other | Attending: Physical Medicine & Rehabilitation | Admitting: Physical Medicine & Rehabilitation

## 2019-02-23 ENCOUNTER — Other Ambulatory Visit: Payer: Self-pay | Admitting: Medical

## 2019-02-23 ENCOUNTER — Encounter: Payer: Self-pay | Admitting: Physical Medicine & Rehabilitation

## 2019-02-23 VITALS — BP 136/86 | HR 94 | Temp 97.3°F | Ht 66.0 in | Wt 182.6 lb

## 2019-02-23 VITALS — BP 134/85 | HR 85 | Ht 66.0 in | Wt 182.0 lb

## 2019-02-23 DIAGNOSIS — T8484XD Pain due to internal orthopedic prosthetic devices, implants and grafts, subsequent encounter: Secondary | ICD-10-CM | POA: Diagnosis not present

## 2019-02-23 DIAGNOSIS — M5137 Other intervertebral disc degeneration, lumbosacral region: Secondary | ICD-10-CM

## 2019-02-23 DIAGNOSIS — M4726 Other spondylosis with radiculopathy, lumbar region: Secondary | ICD-10-CM

## 2019-02-23 DIAGNOSIS — M25561 Pain in right knee: Secondary | ICD-10-CM | POA: Insufficient documentation

## 2019-02-23 DIAGNOSIS — R269 Unspecified abnormalities of gait and mobility: Secondary | ICD-10-CM | POA: Insufficient documentation

## 2019-02-23 DIAGNOSIS — M48062 Spinal stenosis, lumbar region with neurogenic claudication: Secondary | ICD-10-CM | POA: Diagnosis not present

## 2019-02-23 DIAGNOSIS — Z96651 Presence of right artificial knee joint: Secondary | ICD-10-CM

## 2019-02-23 DIAGNOSIS — G8929 Other chronic pain: Secondary | ICD-10-CM | POA: Insufficient documentation

## 2019-02-23 DIAGNOSIS — B373 Candidiasis of vulva and vagina: Secondary | ICD-10-CM

## 2019-02-23 DIAGNOSIS — G894 Chronic pain syndrome: Secondary | ICD-10-CM

## 2019-02-23 DIAGNOSIS — M545 Low back pain: Secondary | ICD-10-CM | POA: Diagnosis not present

## 2019-02-23 DIAGNOSIS — M961 Postlaminectomy syndrome, not elsewhere classified: Secondary | ICD-10-CM | POA: Diagnosis not present

## 2019-02-23 DIAGNOSIS — B3731 Acute candidiasis of vulva and vagina: Secondary | ICD-10-CM

## 2019-02-23 DIAGNOSIS — M1712 Unilateral primary osteoarthritis, left knee: Secondary | ICD-10-CM

## 2019-02-23 DIAGNOSIS — M1612 Unilateral primary osteoarthritis, left hip: Secondary | ICD-10-CM

## 2019-02-23 DIAGNOSIS — F411 Generalized anxiety disorder: Secondary | ICD-10-CM | POA: Diagnosis not present

## 2019-02-23 DIAGNOSIS — M4316 Spondylolisthesis, lumbar region: Secondary | ICD-10-CM | POA: Diagnosis not present

## 2019-02-23 LAB — CERVICOVAGINAL ANCILLARY ONLY
Bacterial Vaginitis (gardnerella): NEGATIVE
Candida Glabrata: NEGATIVE
Candida Vaginitis: POSITIVE — AB
Chlamydia: NEGATIVE
Comment: NEGATIVE
Comment: NEGATIVE
Comment: NEGATIVE
Comment: NEGATIVE
Comment: NEGATIVE
Comment: NORMAL
Neisseria Gonorrhea: NEGATIVE
Trichomonas: NEGATIVE

## 2019-02-23 MED ORDER — TRAMADOL HCL 50 MG PO TABS: 50.0000 mg | ORAL_TABLET | Freq: Four times a day (QID) | ORAL | 0 refills | Status: DC | PRN

## 2019-02-23 MED ORDER — MELOXICAM 15 MG PO TABS: 15.0000 mg | ORAL_TABLET | Freq: Every day | ORAL | 1 refills | Status: DC

## 2019-02-23 MED ORDER — DULOXETINE HCL 30 MG PO CPEP: 30.0000 mg | ORAL_CAPSULE | Freq: Every day | ORAL | 1 refills | Status: DC

## 2019-02-23 MED ORDER — METHOCARBAMOL 750 MG PO TABS: 750.0000 mg | ORAL_TABLET | Freq: Four times a day (QID) | ORAL | 0 refills | Status: DC | PRN

## 2019-02-23 MED ORDER — FLUCONAZOLE 150 MG PO TABS: 150.0000 mg | ORAL_TABLET | Freq: Every day | ORAL | 0 refills | Status: DC

## 2019-02-23 NOTE — Progress Notes (Signed)
Office Visit Note   Patient: Stacy [name]           Date of Birth: Jun 15, 1960           MRN: [id number] Visit Date: 02/23/2019              Requested by: [name], MD [address] [place],  [place] [address] PCP: [name], MD   Assessment & Plan: Visit Diagnoses:  1. Disc disease, degenerative, lumbar or lumbosacral   2. Other spondylosis with radiculopathy, lumbar region   3. Spinal stenosis of lumbar region with neurogenic claudication   4. Spondylolisthesis, lumbar region   5. Unilateral primary osteoarthritis, left hip   6. Pain due to total right knee replacement, subsequent encounter   7. Osteoarthritis of left patellofemoral joint     Plan: Avoid bending, stooping and avoid lifting weights greater than 10 lbs. Avoid prolong standing and walking. Avoid frequent bending and stooping  No lifting greater than 10 lbs. May use ice or moist heat for pain. Weight loss is of benefit. Handicap license is approved. Awaiting hip surgery. Her only option at this point concerning her back would be a 4 level TLIF  L2-3, L3-4, L4-5 and L5-S1.   Follow-Up Instructions: No follow-ups on file.   Orders:  No orders of the defined types were placed in this encounter.  No orders of the defined types were placed in this encounter.     Procedures: No procedures performed   Clinical Data: No additional findings.   Subjective: Chief Complaint  Patient presents with  . Lower Back - Follow-up    59 year old female with history of bilateral knee patellofemoral OA and severe pain associated with ADLs. She underwent right TKR in 8/17. She had 2 manipulations post TKA in order to regain ROM lost post op. She has pain associated with spondylosis of the lumbar spine and has had a previous lumbar lateral recess decompression bilateral L2-3, L3-4 and L4-5 in 2016  And more recently a lumbar microdiscectomy 07/2018 left L3-4. There is constant numbness and  tingling left anterior thigh that is present day and night. Increased pain left anterior thigh with lying on the left side. Turning on side with pillow between the knees helps. She has seen Dr. Ninfa Linden and has been evaluated for consideration of a left THR and this was to be done next week but has  Been rescheduled due to COVID escalation and cancelling of elective surgical cases. No bowel or bladder difficulty. She had an  Episode of abdomenal discomfort and urgency and frequently urination increased. She is using imodium to slow bowel.    Review of Systems  Constitutional: Negative.   HENT: Negative.  Negative for congestion, dental problem, drooling and ear discharge.   Eyes: Positive for discharge. Negative for visual disturbance.  Respiratory: Positive for wheezing. Negative for apnea, cough, choking, chest tightness, shortness of breath and stridor.   Cardiovascular: Negative.  Negative for chest pain, palpitations and leg swelling.  Gastrointestinal: Positive for abdominal pain (Has seen her primary care md), constipation, diarrhea and nausea.  Endocrine: Negative.   Genitourinary: Negative.   Musculoskeletal: Positive for arthralgias, back pain and gait problem.  Allergic/Immunologic: Negative for environmental allergies and food allergies.  Neurological: Positive for weakness and numbness. Negative for dizziness, tremors, seizures, syncope, facial asymmetry, speech difficulty, light-headedness and headaches.  Hematological: Negative for adenopathy. Does not bruise/bleed easily.  Psychiatric/Behavioral: Negative.  Negative for agitation, behavioral problems, confusion,  decreased concentration, dysphoric mood, hallucinations, self-injury, sleep disturbance and suicidal ideas. The patient is not nervous/anxious and is not hyperactive.      Objective: Vital Signs: BP 134/85 (BP Location: Left Arm, Patient Position: Sitting)   Pulse 85   Ht 5\' 6"  (1.676 m)   Wt 182 lb (82.6 kg)   BMI  29.38 kg/m   Physical Exam Constitutional:      Appearance: She is well-developed.  HENT:     Head: Normocephalic and atraumatic.  Eyes:     Pupils: Pupils are equal, round, and reactive to light.  Pulmonary:     Effort: Pulmonary effort is normal.     Breath sounds: Normal breath sounds.  Abdominal:     General: Bowel sounds are normal.     Palpations: Abdomen is soft.  Musculoskeletal:     Cervical back: Normal range of motion and neck supple.     Lumbar back: Negative right straight leg raise test and negative left straight leg raise test.  Skin:    General: Skin is warm and dry.  Neurological:     Mental Status: She is alert and oriented to person, place, and time.  Psychiatric:        Behavior: Behavior normal.        Thought Content: Thought content normal.        Judgment: Judgment normal.     Back Exam   Tenderness  The patient is experiencing tenderness in the lumbar.  Range of Motion  Extension: abnormal  Flexion: abnormal  Lateral bend right: abnormal  Lateral bend left: abnormal  Rotation right: abnormal  Rotation left: abnormal   Muscle Strength  Right Quadriceps:  5/5  Left Quadriceps:  5/5  Right Hamstrings:  5/5  Left Hamstrings:  5/5   Tests  Straight leg raise right: negative Straight leg raise left: negative  Reflexes  Patellar: 0/4 Achilles: 0/4 Babinski's sign: normal   Other  Toe walk: normal Heel walk: abnormal  Comments:  She is in a wheel chair presently. States that she is using the wheel chair due to her hip pain.       Specialty Comments:  No specialty comments available.  Imaging: No results found.   PMFS History: Patient Active Problem List   Diagnosis Date Noted  . Herniation of lumbar intervertebral disc with radiculopathy 10/02/2016    Priority: High    Class: Chronic  . Chondromalacia of both patellae 06/03/2015    Priority: High    Class: Chronic  . 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 Unilateral primary  osteoarthritis, left hip 12/28/2018  . Lesion of skin of left ear 12/26/2018  . Primary osteoarthritis of left hip 12/02/2018  . Iron deficiency anemia 10/16/2018  . Chronic pain syndrome 09/08/2018  . Chronic pain of right knee 08/11/2018  . Status post lumbar laminectomy 07/15/2018  . Moderate persistent asthma without complication 06/29/2017  . Environmental and seasonal allergies 06/29/2017  . Controlled type 2 diabetes mellitus with diabetic polyneuropathy, without long-term current use of insulin (HCC) 06/29/2017  . Perennial allergic rhinitis 04/08/2017  . Sensorineural hearing loss (SNHL), bilateral 04/08/2017  . Chronic pansinusitis 03/25/2017  . Eustachian tube dysfunction, bilateral 03/25/2017  . 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 Alopecia areata 08/19/2016  . Spinal stenosis, lumbar region, with neurogenic claudication 06/03/2015  . Tobacco use disorder 04/25/2015  . DJD (degenerative joint disease) of knee 01/04/2015  . Hemorrhoid 11/14/2014  . Gout of big toe 07/19/2014  . Essential hypertension 08/14/2013  .  Gastroesophageal reflux disease without esophagitis 08/14/2013  . COPD (chronic obstructive pulmonary disease) (HCC) 04/17/2011   Past Medical History:  Diagnosis Date  . Allergy    Shellfish, cleaning products  . Arthritis   . 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 Asthma   . COPD (chronic obstructive pulmonary disease) (HCC)   . Diabetes mellitus    Type II  . GERD (gastroesophageal reflux disease)    Pt on Protonix daily  . Glaucoma   . Gout   . Headache(784.0)    otc meds prn  . Hyperlipidemia   . Hypertension   . Irritable bowel syndrome 11/19/2010  . Neuropathy   . Pneumonia   . Restless legs   . Shortness of breath    07/14/2018- uses  4 times a day    Family History  Problem Relation Age of Onset  . Hypertension Father   . Cancer Father   . Heart disease Mother   . Asthma Son        had as a child  . Heart disease  Sister   . Breast cancer Sister   . Hypertension Brother     Past Surgical History:  Procedure Laterality Date  . CHOLECYSTECTOMY    . COLONOSCOPY    . ENDOMETRIAL ABLATION  10/2010  . HERNIA REPAIR     umbicial hernia  . KNEE ARTHROSCOPY Left    06/07/2017 Dr. August Saucer of [facility]  . KNEE CLOSED REDUCTION Right 12/06/2015   Procedure: CLOSED MANIPULATION RIGHT KNEE;  Surgeon: Kerrin Champagne, MD;  Location: [facility] OR;  Service: Orthopedics;  Laterality: Right;  . KNEE CLOSED REDUCTION Right 01/17/2016   Procedure: CLOSED MANIPULATION RIGHT KNEE;  Surgeon: Kerrin Champagne, MD;  Location: [facility] OR;  Service: Orthopedics;  Laterality: Right;  . KNEE JOINT MANIPULATION Right 12/06/2015  . LUMBAR DISC SURGERY  06/03/2015   L 2  L3 L4 L5   . LUMBAR LAMINECTOMY/DECOMPRESSION MICRODISCECTOMY N/A 06/03/2015   Procedure: Bilateral lateral recess decompression L2-3, L3-4, L4-5;  Surgeon: Kerrin Champagne, MD;  Location: [facility] OR;  Service: Orthopedics;  Laterality: N/A;  . LUMBAR LAMINECTOMY/DECOMPRESSION MICRODISCECTOMY N/A 10/02/2016   Procedure: Right L5-S1 Lateral Recess Decompression  microdiscectomy;  Surgeon: Kerrin Champagne, MD;  Location: [facility];  Service: Orthopedics;  Laterality: N/A;  . LUMBAR LAMINECTOMY/DECOMPRESSION MICRODISCECTOMY N/A 07/15/2018   Procedure: LEFT L3-4 MICRODISCECTOMY;  Surgeon: Kerrin Champagne, MD;  Location: [facility] OR;  Service: Orthopedics;  Laterality: N/A;  . svd      x 2  . TOTAL KNEE ARTHROPLASTY Right 09/06/2015   Procedure: RIGHT TOTAL KNEE ARTHROPLASTY;  Surgeon: Kerrin Champagne, MD;  Location: [facility] OR;  Service: Orthopedics;  Laterality: Right;  . TUBAL LIGATION    . UPPER GASTROINTESTINAL ENDOSCOPY  04/28/11   Social History   Occupational History  . Occupation: unemployed    Associate Professor: UNEMPLOYED  Tobacco Use  . Smoking status: Current Some Day Smoker    Packs/day: 0.25    Years: 32.00    Pack years: 8.00    Types: Cigarettes  . Smokeless tobacco: Never Used  Substance and  Sexual Activity  . Alcohol use: No  . Drug use: No  . Sexual activity: Yes    Birth control/protection: Surgical

## 2019-02-23 NOTE — Patient Instructions (Signed)
Please take 30mg  of Duloxetine (1 cap) for 2 weeks and increase to 60mg  (2 caps) afterward with food

## 2019-02-23 NOTE — Progress Notes (Signed)
Patient seen and assessed by nursing staff during this encounter. I have reviewed the chart and agree with the documentation and plan.  Vonzella Nipple, PA-C 02/23/2019 11:33 AM

## 2019-02-23 NOTE — Patient Instructions (Signed)
Avoid bending, stooping and avoid lifting weights greater than 10 lbs. Avoid prolong standing and walking. Avoid frequent bending and stooping  No lifting greater than 10 lbs. May use ice or moist heat for pain. Weight loss is of benefit. Handicap license is approved. Awaiting hip surgery. Her only option at this point concerning her back would be a 4 level TLIF  L2-3, L3-4, L4-5 and L5-S1.

## 2019-02-23 NOTE — Progress Notes (Signed)
Late entry:  Pt presented yesterday (1/20) morning for scheduled nurse visit. She c/o burning with urination as well as vaginal odor and some itching. POCT UA was negative however due to her c/o, the specimen was sent for culture. Self vaginal swab collected and sent for wet prep. Pt will be notified of results via MyChart. She voiced understanding.

## 2019-02-23 NOTE — Progress Notes (Signed)
Subjective:    Patient ID: Stacy [name], female    DOB: 1960-11-29, 59 y.o.   MRN: [id number]  HPI Female with pmh/psh RLS, neuropathy, HTN, headache, GERD, DM, COPD, lumbar spinal stenosis with claudication s/p lumbar surgery x3 (most recent 07/15/2018), DJD, PAD right TKA in 2017 followed up by manipulation x2 presents LE pain. Initially stated: History taken from chart review and patient. Started ~2018.  States she had a knee pain with buckling.  Denies inciting event.  Getting improve.  Ice/heat improves the pain.  Prolonged standing exacerbates the pain.  Non-radiating. Sharp.  Intermittent.  Denies associated numbness.  Denies falls. Pain limits prolonged standing and ambulation.   Last clinic visit 12/02/2018.  Since that time, patient saw orthospine and Ortho. Notes reviewed. Patient was scheduled to have left [facility], but due to Covid elective surgery was postponed.  Orthospine offered patient for level TLIF.  Since that time, patient states she was seen by other providers and was diagnosed with Lupus after skin biopsy. Wed she goes for eye surgery.  Therapies were placed on hold.  She is using TENS unit.  Lidoderm patch provides some difference. She has essential been in a wheelchair due to hip pain.  She has been having falls due to her hip giving out. She takes phone call during visit.   Pain Inventory Average Pain 8 Pain Right Now 9 My pain is intermittent, sharp, tingling and aching  In the last 24 hours, has pain interfered with the following? General activity 7 Relation with others 0 Enjoyment of life 5 What TIME of day is your pain at its worst? all Sleep (in general) Poor  Pain is worse with: walking, sitting and standing Pain improves with: rest, heat/ice and TENS Relief from Meds: 1  Mobility walk with assistance use a walker how many minutes can you walk? 20-30 ability to climb steps?  no do you drive?  no Do you have any goals in this area?  no   Function disabled: date disabled 2016  Neuro/Psych tingling trouble walking spasms anxiety  Prior Studies Any changes since last visit?  no  Physicians involved in your care Any changes since last visit?  no Primary care Dr. Jonah Blue Orthopedist Dr. [name]   Family History  Problem Relation Age of Onset  . Hypertension Father   . Cancer Father   . Heart disease Mother   . Asthma Son        had as a child  . Heart disease Sister   . Breast cancer Sister   . Hypertension Brother    Social History   Socioeconomic History  . Marital status: Married    Spouse name: Not on file  . Number of children: 2  . Years of education: Not on file  . Highest education level: Not on file  Occupational History  . Occupation: unemployed    Associate Professor: UNEMPLOYED  Tobacco Use  . Smoking status: Current Some Day Smoker    Packs/day: 0.25    Years: 32.00    Pack years: 8.00    Types: Cigarettes  . Smokeless tobacco: Never Used  Substance and Sexual Activity  . Alcohol use: No  . Drug use: No  . Sexual activity: Yes    Birth control/protection: Surgical  Other Topics Concern  . Not on file  Social History Narrative  . Not on file   Social Determinants of Health   Financial Resource Strain:   . Difficulty of Paying Living  Expenses: Not on file  Food Insecurity:   . Worried About Programme researcher, broadcasting/film/video in the Last Year: Not on file  . Ran Out of Food in the Last Year: Not on file  Transportation Needs:   . Lack of Transportation (Medical): Not on file  . Lack of Transportation (Non-Medical): Not on file  Physical Activity:   . Days of Exercise per Week: Not on file  . Minutes of Exercise per Session: Not on file  Stress:   . Feeling of Stress : Not on file  Social Connections:   . Frequency of Communication with Friends and Family: Not on file  . Frequency of Social Gatherings with Friends and Family: Not on file  . Attends Religious Services: Not on file  .  Active Member of Clubs or Organizations: Not on file  . Attends Banker Meetings: Not on file  . Marital Status: Not on file   Past Surgical History:  Procedure Laterality Date  . CHOLECYSTECTOMY    . COLONOSCOPY    . ENDOMETRIAL ABLATION  10/2010  . HERNIA REPAIR     umbicial hernia  . KNEE ARTHROSCOPY Left    06/07/2017 Dr. August Saucer [facility]  . KNEE CLOSED REDUCTION Right 12/06/2015   Procedure: CLOSED MANIPULATION RIGHT KNEE;  Surgeon: Kerrin Champagne, MD;  Location: [facility] OR;  Service: Orthopedics;  Laterality: Right;  . KNEE CLOSED REDUCTION Right 01/17/2016   Procedure: CLOSED MANIPULATION RIGHT KNEE;  Surgeon: Kerrin Champagne, MD;  Location: [facility] OR;  Service: Orthopedics;  Laterality: Right;  . KNEE JOINT MANIPULATION Right 12/06/2015  . LUMBAR DISC SURGERY  06/03/2015   L 2  L3 L4 L5   . LUMBAR LAMINECTOMY/DECOMPRESSION MICRODISCECTOMY N/A 06/03/2015   Procedure: Bilateral lateral recess decompression L2-3, L3-4, L4-5;  Surgeon: Kerrin Champagne, MD;  Location: [facility] OR;  Service: Orthopedics;  Laterality: N/A;  . LUMBAR LAMINECTOMY/DECOMPRESSION MICRODISCECTOMY N/A 10/02/2016   Procedure: Right L5-S1 Lateral Recess Decompression  microdiscectomy;  Surgeon: Kerrin Champagne, MD;  Location: [facility];  Service: Orthopedics;  Laterality: N/A;  . LUMBAR LAMINECTOMY/DECOMPRESSION MICRODISCECTOMY N/A 07/15/2018   Procedure: LEFT L3-4 MICRODISCECTOMY;  Surgeon: Kerrin Champagne, MD;  Location: [facility] OR;  Service: Orthopedics;  Laterality: N/A;  . svd      x 2  . TOTAL KNEE ARTHROPLASTY Right 09/06/2015   Procedure: RIGHT TOTAL KNEE ARTHROPLASTY;  Surgeon: Kerrin Champagne, MD;  Location: [facility] OR;  Service: Orthopedics;  Laterality: Right;  . TUBAL LIGATION    . UPPER GASTROINTESTINAL ENDOSCOPY  04/28/11   Past Medical History:  Diagnosis Date  . Allergy    Shellfish, cleaning products  . Arthritis   . 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 Asthma   . COPD (chronic obstructive  pulmonary disease) (HCC)   . Diabetes mellitus    Type II  . GERD (gastroesophageal reflux disease)    Pt on Protonix daily  . Glaucoma   . Gout   . Headache(784.0)    otc meds prn  . Hyperlipidemia   . Hypertension   . Irritable bowel syndrome 11/19/2010  . Neuropathy   . Pneumonia   . Restless legs   . Shortness of breath    07/14/2018- uses  4 times a day   BP 136/86   Pulse 94   Temp (!) 97.3 F (36.3 C)   Ht 5\' 6"  (1.676 m)   Wt 182 lb 9.6 oz (82.8 kg)  SpO2 96%   BMI 29.47 kg/m   Opioid Risk Score:   Fall Risk Score:  `1  Depression screen PHQ 2/9  Depression screen [facility] 2/9 10/14/2018 08/11/2018 08/09/2018 06/06/2018 03/30/2018 02/14/2018 12/06/2017  Decreased Interest 0 0 0 0 0 0 0  Down, Depressed, Hopeless 0 0 0 0 0 0 0  PHQ - 2 Score 0 0 0 0 0 0 0  Altered sleeping - - 0 0 - - 0  Tired, decreased energy - - 0 0 - - 2  Change in appetite - - 0 0 - - 1  Feeling bad or failure about yourself  - - 0 0 - - 0  Trouble concentrating - - 0 0 - - 0  Moving slowly or fidgety/restless - - 0 0 - - 0  Suicidal thoughts - - 0 - - - 0  PHQ-9 Score - - 0 0 - - 3  Difficult doing work/chores - - Not difficult at all - - - -  Some recent data might be hidden    Review of Systems  Constitutional: Positive for diaphoresis.  HENT: Negative.   Eyes: Negative.   Respiratory: Negative.   Cardiovascular: Negative.   Gastrointestinal: Negative.   Endocrine: Negative.   Genitourinary: Negative.   Musculoskeletal: Positive for arthralgias and gait problem.       Spasms   Skin: Negative.   Allergic/Immunologic: Negative.   Hematological: Negative.   Psychiatric/Behavioral: The patient is nervous/anxious.       Objective:   Physical Exam Gen: NAD. Vital signs reviewed HENT: Normocephalic. Atraumatic.  Eyes: EOMI. No discharge.  Cardio: No JVD. Pulm: Effort normal. Abd: Non-distended MSK:  Gait WNL.  +Significant TTP left greatertrochanteric area with pain with ER/IR    No edema.  Neuro:  Strength  5/5 in all LE myotomes (LLE limited due to pain) Skin: Warm and Dry. Intact    Assessment & Plan:  Female with pmh/psh RLS, neuropathy, HTN, headache, GERD, DM, COPD, lumbar spinal stenosis with claudication s/p lumbar surgery x3 (most recent 07/15/2018), DJD, PAD right TKA in 2017 followed up by manipulation x2 presents with LE pain.  1. Chronic right knee pain s/p TKA with manipulation x2  MRI from 09/2017 showing postop changes  Cont Heat/Cold  Cont outpatient PT after THA  Cont E-stim  Will consider bracing if necessary  Cont OTC Lidoderm patch   Will order Cymbalta 30mg  daily with food, may increase to 60 after 2 weeks, educate on signs/symptoms of serotonin syndrome (not taking Tramadol anyway)  Cont Robaxin per Ortho spine  Will order Mobic 15mg  with food  Patient states main goal is to be more active   2. Gait abnormality  Cont rollator/wheelchair due to hip pain  Cont therapies after THA  See #1  3. LLE pain  Cont Gabapentin to 600 TID  See #1  4. Left hip pain  MRI suggesting Labral and OA with muscle strain  Some component of bursisits - discussed injection, however patient refusing, states she cannot tolerate needles and would rather have replacement  Cont follow up per ortho  5. Low back pain   Follow up with Ortho spine for ?TLIF

## 2019-02-24 ENCOUNTER — Ambulatory Visit: Payer: Medicaid Other | Attending: Internal Medicine | Admitting: Internal Medicine

## 2019-02-24 ENCOUNTER — Telehealth: Payer: Self-pay | Admitting: Internal Medicine

## 2019-02-24 ENCOUNTER — Encounter (HOSPITAL_BASED_OUTPATIENT_CLINIC_OR_DEPARTMENT_OTHER): Payer: Self-pay | Admitting: Ophthalmology

## 2019-02-24 ENCOUNTER — Other Ambulatory Visit: Payer: Self-pay

## 2019-02-24 ENCOUNTER — Other Ambulatory Visit: Payer: Self-pay | Admitting: Medical

## 2019-02-24 DIAGNOSIS — I739 Peripheral vascular disease, unspecified: Secondary | ICD-10-CM | POA: Diagnosis not present

## 2019-02-24 DIAGNOSIS — M329 Systemic lupus erythematosus, unspecified: Secondary | ICD-10-CM

## 2019-02-24 DIAGNOSIS — J439 Emphysema, unspecified: Secondary | ICD-10-CM | POA: Diagnosis not present

## 2019-02-24 DIAGNOSIS — E1142 Type 2 diabetes mellitus with diabetic polyneuropathy: Secondary | ICD-10-CM | POA: Diagnosis not present

## 2019-02-24 DIAGNOSIS — D508 Other iron deficiency anemias: Secondary | ICD-10-CM | POA: Diagnosis not present

## 2019-02-24 DIAGNOSIS — B952 Enterococcus as the cause of diseases classified elsewhere: Secondary | ICD-10-CM

## 2019-02-24 DIAGNOSIS — I10 Essential (primary) hypertension: Secondary | ICD-10-CM | POA: Diagnosis not present

## 2019-02-24 DIAGNOSIS — M1612 Unilateral primary osteoarthritis, left hip: Secondary | ICD-10-CM

## 2019-02-24 DIAGNOSIS — N390 Urinary tract infection, site not specified: Secondary | ICD-10-CM

## 2019-02-24 LAB — URINE CULTURE

## 2019-02-24 MED ORDER — CLOPIDOGREL BISULFATE 75 MG PO TABS: 75.0000 mg | ORAL_TABLET | Freq: Every day | ORAL | 2 refills | Status: DC

## 2019-02-24 MED ORDER — GLIMEPIRIDE 2 MG PO TABS: 2.0000 mg | ORAL_TABLET | Freq: Every day | ORAL | 2 refills | Status: DC

## 2019-02-24 MED ORDER — PANTOPRAZOLE SODIUM 40 MG PO TBEC: 40.0000 mg | DELAYED_RELEASE_TABLET | Freq: Every day | ORAL | 2 refills | Status: DC

## 2019-02-24 MED ORDER — NITROFURANTOIN MONOHYD MACRO 100 MG PO CAPS: 100.0000 mg | ORAL_CAPSULE | Freq: Two times a day (BID) | ORAL | 0 refills | Status: DC

## 2019-02-24 NOTE — Progress Notes (Signed)
Virtual Visit via Telephone Note Due to current restrictions/limitations of in-office visits due to the COVID-19 pandemic, this scheduled clinical appointment was converted to a telehealth visit  I connected with Stacy [name] on 02/24/19 at 2:37 p.m by telephone and verified that I am speaking with the correct person using two identifiers. I am in my office.  The patient is at home.  Only the patient and myself participated in this encounter.  I discussed the limitations, risks, security and privacy concerns of performing an evaluation and management service by telephone and the availability of in person appointments. I also discussed with the patient that there may be a patient responsible charge related to this service. The patient expressed understanding and agreed to proceed.   History of Present Illness: Pt with hx of HTN, DM with neuropathy,tob dep,HL,PAD, IDA, spinal stenosis with neurogenic claudication(s/p laminectomy 07/2018),COPD, RLS,OA knees,gout, anemia, recurrent sinusitis, receiving allergy shots from Dr. Faith Rogue..  Patient last seen 11/2018.  Purpose of today's visit is chronic disease management.  Pt had bx of LT ear lesion and scalp by derm.  Told she has SLE.  Has f/u with derm at [facility] 04/2019  Suppose to have THR LT side by Dr. Ninfa Linden.  Postpone due to decrease bed capacity in hosp due to COVID pandemic.  Hope to have done in march/April.  Told she will also need surgery on lower back due to severe disc disease  IDA:  Compliant with iron supplement  DM:  Checks BS every morning. Range 112-120.   Reports frequent nausea.  Saw Dr. Osborne Oman from [place] 12/2018 but he retired.  Will be assigned a new GI specialist with same group -will be having surgery on eyes next wk to put tubes in eyes to correct persistent lacrimation.  Told she needs to be off plavix for several days prior  HTN:  Not checking recently Reports compliance with medications  and low-salt diet.  No lower extremity edema.  No chest pains or shortness of breath.  COPD:  "my breathing has been pretty good."  Uses neb BID.  Compliant with Spiriva and Symbicort Outpatient Encounter Medications as of 02/24/2019  Medication Sig  . Accu-Chek Softclix Lancets lancets USE AS INSTRUCTED 3 TIMES A DAY  . albuterol (PROVENTIL) (2.5 MG/3ML) 0.083% nebulizer solution Take 3 mLs (2.5 mg total) by nebulization every 6 (six) hours as needed for wheezing.  Marland Kitchen allopurinol (ZYLOPRIM) 100 MG tablet TAKE 1 TABLET (100 MG TOTAL) BY MOUTH DAILY.  Marland Kitchen Blood Glucose Monitoring Suppl (ACCU-CHEK AVIVA PLUS) w/Device KIT 1 each by Does not apply route 3 (three) times daily.  . budesonide-formoterol (SYMBICORT) 80-4.5 MCG/ACT inhaler INHALE TWO PUFFS BY MOUTH TWICE A DAY (Patient taking differently: Inhale 2 puffs into the lungs 2 (two) times a day. )  . clopidogrel (PLAVIX) 75 MG tablet TAKE 1 TABLET (75 MG TOTAL) BY MOUTH DAILY.  Marland Kitchen diclofenac sodium (VOLTAREN) 1 % GEL Apply 4 g topically 4 (four) times daily.  . DULoxetine (CYMBALTA) 30 MG capsule Take 1 capsule (30 mg total) by mouth daily.  Marland Kitchen EPINEPHrine 0.3 mg/0.3 mL IJ SOAJ injection 0.3 mg IM x 1 PRN for allergic reaction (Patient taking differently: Inject 0.3 mg into the muscle as needed for anaphylaxis. )  . [name] 325 (65 Fe) MG tablet TAKE 1 TABLET BY MOUTH 2 (TWO) TIMES DAILY WITH A MEAL.  . fluticasone (FLONASE) 50 MCG/ACT nasal spray Place 1 spray into both nostrils daily.  Marland Kitchen gabapentin (NEURONTIN) 600  MG tablet TAKE 1 TABLET (600 MG TOTAL) BY MOUTH 3 (THREE) TIMES DAILY.  Marland Kitchen glimepiride (AMARYL) 2 MG tablet TAKE 1 TABLET (2 MG TOTAL) BY MOUTH DAILY.  . hydrochlorothiazide (HYDRODIURIL) 25 MG tablet TAKE 1 TABLET (25 MG TOTAL) BY MOUTH DAILY.  . hydrOXYzine (ATARAX/VISTARIL) 10 MG tablet TAKE 1 TABLET IN THE MORNING AND NOON AND 2 TABLETS IN THE EVENING AS NEEDED  . Lancets (ACCU-CHEK SOFT TOUCH) lancets Use as instructed  . meloxicam  (MOBIC) 15 MG tablet Take 1 tablet (15 mg total) by mouth daily.  . methocarbamol (ROBAXIN) 750 MG tablet Take 1 tablet (750 mg total) by mouth every 6 (six) hours as needed for muscle spasms.  Marland Kitchen MITIGARE 0.6 MG CAPS Take 1 capsule by mouth daily.  . montelukast (SINGULAIR) 10 MG tablet Take 10 mg by mouth at bedtime.   . pantoprazole (PROTONIX) 40 MG tablet TAKE 1 TABLET (40 MG TOTAL) BY MOUTH DAILY.  Marland Kitchen Potassium Chloride ER 20 MEQ TBCR Take 20 mEq by mouth daily.  Marland Kitchen Respiratory Therapy Supplies (FLUTTER) DEVI Use after breathing treatment 4 times daily  . senna-docusate (SENOKOT S) 8.6-50 MG tablet Take 1 tablet by mouth daily as needed for mild constipation.  . simvastatin (ZOCOR) 20 MG tablet TAKE ONE TABLET BY MOUTH DAILY AT BEDTIME.  Marland Kitchen tiotropium (SPIRIVA HANDIHALER) 18 MCG inhalation capsule Place 1 capsule (18 mcg total) into inhaler and inhale daily.  Marland Kitchen triamcinolone (KENALOG) 0.025 % cream Apply 1 application topically 2 (two) times daily.  . valsartan-hydrochlorothiazide (DIOVAN-HCT) 160-12.5 MG tablet TAKE ONE TABLET BY MOUTH ONCE DAILY  . [DISCONTINUED] fluconazole (DIFLUCAN) 150 MG tablet Take 1 tablet (150 mg total) by mouth daily.  . ALLERGY RELIEF 10 MG tablet TAKE 1 TABLET (10 MG TOTAL) BY MOUTH DAILY.  Marland Kitchen docusate sodium (COLACE) 100 MG capsule Take 1 capsule (100 mg total) by mouth 2 (two) times daily. (Patient not taking: Reported on 02/24/2019)  . glucose blood (ACCU-CHEK AVIVA PLUS) test strip USE 3 TIMES A DAY AS DIRECTED BY PHYSICIAN  . Melatonin 3 MG TABS Take 1 tablet (3 mg total) by mouth at bedtime as needed. (Patient taking differently: Take 3 mg by mouth at bedtime as needed (sleep). )  . Multiple Vitamin (MULTIVITAMIN WITH MINERALS) TABS tablet Take 1 tablet by mouth daily. CENTRUM SILVER  . nicotine (NICODERM CQ - DOSED IN MG/24 HOURS) 14 mg/24hr patch PLACE 1 PATCH ONTO THE SKIN DAILY  . promethazine (PHENERGAN) 25 MG tablet Take 1 tablet (25 mg total) by mouth 2  (two) times daily as needed for nausea or vomiting.  . [DISCONTINUED] Fluticasone-Salmeterol (ADVAIR) 500-50 MCG/DOSE AEPB Inhale 1 puff into the lungs every 12 (twelve) hours.    . [DISCONTINUED] lisinopril (PRINIVIL,ZESTRIL) 40 MG tablet Take 40 mg by mouth daily.     No facility-administered encounter medications on file as of 02/24/2019.      Observations/Objective: Lab Results  Component Value Date   WBC 9.0 10/14/2018   HGB 9.9 (L) 10/14/2018   HCT 33.4 (L) 10/14/2018   MCV 79 10/14/2018   PLT 264 07/16/2018     Chemistry      Component Value Date/Time   NA 136 07/16/2018 0415   NA 138 02/11/2018 0923   K 4.0 07/16/2018 0415   CL 100 07/16/2018 0415   CO2 24 07/16/2018 0415   BUN 10 07/16/2018 0415   BUN 12 02/11/2018 0923   CREATININE 0.82 07/16/2018 0415   CREATININE 0.90 03/11/2016 1556  Component Value Date/Time   CALCIUM 9.0 07/16/2018 0415   ALKPHOS 107 07/15/2018 1031   AST 22 07/15/2018 1031   ALT 17 07/15/2018 1031   BILITOT 0.4 07/15/2018 1031   BILITOT 0.2 11/11/2017 0950       Assessment and Plan: 1. Type 2 diabetes mellitus with diabetic polyneuropathy, without long-term current use of insulin ([place]) Reported blood sugars are at goal.  She will continue current medications and healthy eating habits.  She is not able to do much in terms of exercise due to osteoarthritis in the hip and chronic back pain issues - Hemoglobin A1c; Future - CBC; Future - glimepiride (AMARYL) 2 MG tablet; Take 1 tablet (2 mg total) by mouth daily.  Dispense: 30 tablet; Refill: 2 - Comprehensive metabolic panel; Future  2. Essential hypertension Advised patient to check blood pressure at least twice a week with goal being 130/80 or lower.  Continue low-salt diet and current medications  3. Pulmonary emphysema, unspecified emphysema type ([place]) Doing well at this time.  Continue current inhalers.  4. Lupus ([employer]) I have asked that she have the dermatologist send me a  copy of the pathology report.  I want to know whether they think this is SLE versus discoid lupus  5. Other iron deficiency anemia Continue iron supplement  6. Primary osteoarthritis of left hip Followed by orthopedics.  Plan for hip replacement in the spring  7. PAD (peripheral artery disease) ([place]) Patient will be having surgery on her eye next Wednesday.  I told her if they want her to be off the Plavix it is best to stop Plavix at least 7 days before.  She has not taken it today as yet so she will stop it as of today until after her eye surgery. - clopidogrel (PLAVIX) 75 MG tablet; Take 1 tablet (75 mg total) by mouth daily.  Dispense: 30 tablet; Refill: 2   Follow Up Instructions: 2 mths   I discussed the assessment and treatment plan with the patient. The patient was provided an opportunity to ask questions and all were answered. The patient agreed with the plan and demonstrated an understanding of the instructions.   The patient was advised to call back or seek an in-person evaluation if the symptoms worsen or if the condition fails to improve as anticipated.  I provided 15 minutes of non-face-to-face time during this encounter.   Karle Plumber, MD

## 2019-02-24 NOTE — Progress Notes (Addendum)
Spoke w/ via phone for pre-op interview---[facility] needs dos---- I STAT 8             Lab results------ COVID test ------02-25-2019 Arrive at -------830 AM 03-01-2019 NPO after ------MIDNIGHT Medications to take morning of surgery -----ALBUTEROL NEBULIZER, SPIRIVA SYMBICORT, CYMBALTA, FLONASE (THIS IS ALL THE MEDICATIONS PATIENT WISHES TO TAKE DAY OF SURGERY DUE TO REST OF MEDS ARE VACUUM PACKED) PATIENT TO BRING INHALERS DAY OF SURGERY Diabetic medication -----N/A Patient Special Instructions ----- Pre-Op special Istructions ----- Patient verbalized understanding of instructions that were given at this phone interview. Patient denies shortness of breath, chest pain, fever, cough a this phone interview.  Anesthesia : COPD  PCP:  DR Jonah Blue Cardiologist :NONE Chest x-ray :01-19-2019 CHART/EPIC EKG :10-14-2018 CHART/EPIC Echo :NONE Cardiac Cath : NONE Sleep Study/ CPAP :NONE LOV DR Sherene Sires 12-06-2018 CHART/EPIC Fasting Blood Sugar :      / Checks Blood Sugar -- times a day:  DOES NOT CHECK CBG Blood Thinner/ Instructions /Last Dose:PLAVIX, PATIENT TO CALL DR SPENCER OFFICE FOR INSTRUCTIONS ON STOPPING PLAVIX ASA / Instructions/ Last Dose : NONE  Patient denies shortness of breath, chest pain, fever, and cough at this phone interview.

## 2019-02-24 NOTE — Telephone Encounter (Signed)
Pt called asking for nausea medications stated she been sick on her stomach

## 2019-02-25 ENCOUNTER — Other Ambulatory Visit (HOSPITAL_COMMUNITY)
Admission: RE | Admit: 2019-02-25 | Discharge: 2019-02-25 | Disposition: A | Discharge status: Home / Self Care | Payer: Medicaid Other | Source: Ambulatory Visit | Attending: Ophthalmology | Admitting: Ophthalmology

## 2019-02-25 DIAGNOSIS — Z20822 Contact with and (suspected) exposure to covid-19: Secondary | ICD-10-CM | POA: Insufficient documentation

## 2019-02-25 DIAGNOSIS — Z01812 Encounter for preprocedural laboratory examination: Secondary | ICD-10-CM | POA: Insufficient documentation

## 2019-02-25 LAB — SARS CORONAVIRUS 2 (TAT 6-24 HRS): SARS Coronavirus 2: NEGATIVE

## 2019-02-26 ENCOUNTER — Encounter (HOSPITAL_BASED_OUTPATIENT_CLINIC_OR_DEPARTMENT_OTHER): Payer: Self-pay | Admitting: Ophthalmology

## 2019-02-26 MED ORDER — ONDANSETRON HCL 4 MG PO TABS: 4.0000 mg | ORAL_TABLET | Freq: Every day | ORAL | 1 refills | Status: DC | PRN

## 2019-02-26 NOTE — H&P (Signed)
Stacy [name] is an 59 y.o. female.   Chief Complaint: My eyes continuously run and drip tears. HPI: 59 Y/O BF c h/o NLD obstruction s/p balloon caniculoplasty s resolution of symptoms : Pt presents for elective NLD probing and irrigation ou c crawford tube implants under general anesthesia.  Past Medical History:  Diagnosis Date  . Allergy    Shellfish, cleaning products  . Arthritis   . Arthrofibrosis of total knee replacement ([place])    right  . Asthma   . COPD (chronic obstructive pulmonary disease) ([place])   . Diabetes mellitus    Type II  . GERD (gastroesophageal reflux disease)    Pt on Protonix daily  . Glaucoma   . Gout   . Headache(784.0)    otc meds prn  . Hyperlipidemia   . Hypertension   . Irritable bowel syndrome 11/19/2010  . Neuropathy   . Pneumonia YRS AGO  . Restless legs   . Shortness of breath    07/14/2018- uses  4 times a day    Past Surgical History:  Procedure Laterality Date  . CHOLECYSTECTOMY    . COLONOSCOPY    . ENDOMETRIAL ABLATION  10/2010  . HERNIA REPAIR     umbicial hernia  . KNEE ARTHROSCOPY Left    06/07/2017 Dr. Marlou Sa of [facility]  . KNEE CLOSED REDUCTION Right 12/06/2015   Procedure: CLOSED MANIPULATION RIGHT KNEE;  Surgeon: [name], MD;  Location: [facility];  Service: Orthopedics;  Laterality: Right;  . KNEE CLOSED REDUCTION Right 01/17/2016   Procedure: CLOSED MANIPULATION RIGHT KNEE;  Surgeon: [name], MD;  Location: [facility];  Service: Orthopedics;  Laterality: Right;  . KNEE JOINT MANIPULATION Right 12/06/2015  . LUMBAR DISC SURGERY  06/03/2015   L 2  L3 L4 L5   . LUMBAR LAMINECTOMY/DECOMPRESSION MICRODISCECTOMY N/A 06/03/2015   Procedure: Bilateral lateral recess decompression L2-3, L3-4, L4-5;  Surgeon: [name], MD;  Location: [facility];  Service: Orthopedics;  Laterality: N/A;  . LUMBAR LAMINECTOMY/DECOMPRESSION MICRODISCECTOMY N/A 10/02/2016   Procedure: Right L5-S1 Lateral Recess Decompression  microdiscectomy;   Surgeon: [name], MD;  Location: [facility];  Service: Orthopedics;  Laterality: N/A;  . LUMBAR LAMINECTOMY/DECOMPRESSION MICRODISCECTOMY N/A 07/15/2018   Procedure: LEFT L3-4 MICRODISCECTOMY;  Surgeon: [name], MD;  Location: [facility];  Service: Orthopedics;  Laterality: N/A;  . svd      x 2  . TOTAL KNEE ARTHROPLASTY Right 09/06/2015   Procedure: RIGHT TOTAL KNEE ARTHROPLASTY;  Surgeon: [name], MD;  Location: [facility];  Service: Orthopedics;  Laterality: Right;  . TUBAL LIGATION    . UPPER GASTROINTESTINAL ENDOSCOPY  04/28/11    Family History  Problem Relation Age of Onset  . Hypertension Father   . Cancer Father   . Heart disease Mother   . Asthma Son        had as a child  . Heart disease Sister   . Breast cancer Sister   . Hypertension Brother    Social History:  reports that she has been smoking cigarettes. She has a 8.00 pack-year smoking history. She has never used smokeless tobacco. She reports that she does not drink alcohol or use drugs.  Allergies:  Allergies  Allergen Reactions  . Other Shortness Of Breath    UNSPECIFIED AGENTS Allergic to perfumes and cleaning products  . Shellfish Allergy Anaphylaxis    Per allergy test.  . Ace Inhibitors Cough and Other (See Comments)       .  Aspirin Nausea Only    No medications prior to admission.    Results for orders placed or performed during the hospital encounter of 02/25/19 (from the past 48 hour(s))  SARS CORONAVIRUS 2 (TAT 6-24 HRS) Nasopharyngeal Nasopharyngeal Swab     Status: None   Collection Time: 02/25/19  9:22 AM   Specimen: Nasopharyngeal Swab  Result Value Ref Range   SARS Coronavirus 2 NEGATIVE NEGATIVE    Comment: (NOTE) SARS-CoV-2 target nucleic acids are NOT DETECTED. The SARS-CoV-2 RNA is generally detectable in upper and lower respiratory specimens during the acute phase of infection. Negative results do not preclude SARS-CoV-2 infection, do not rule out co-infections with other  pathogens, and should not be used as the sole basis for treatment or other patient management decisions. Negative results must be combined with clinical observations, patient history, and epidemiological information. The expected result is Negative. Fact Sheet for Patients: HairSlick.no Fact Sheet for Healthcare Providers: quierodirigir.com This test is not yet approved or cleared by the [place] FDA and  has been authorized for detection and/or diagnosis of SARS-CoV-2 by FDA under an Emergency Use Authorization (EUA). This EUA will remain  in effect (meaning this test can be used) for the duration of the COVID-19 declaration under Section 56 4(b)(1) of the Act, 21 U.S.C. section 360bbb-3(b)(1), unless the authorization is terminated or revoked sooner. Performed at [facility], [address], [place], [place] [address]    No results found.  Review of Systems  Constitutional: Negative.   HENT: Negative.   Eyes:        NLD obstruction ou ;   Respiratory:       Asthma  Cardiovascular:       Hypertension  Skin: Negative.   Psychiatric/Behavioral: Negative.     Height 5\' 6"  (1.676 m), weight 83 kg, last menstrual period 09/01/2010. Physical Exam  Constitutional: She appears well-developed and well-nourished.  Eyes: Pupils are equal, round, and reactive to light.    Drainage   Cardiovascular: Normal rate.  Respiratory: Breath sounds normal.  Musculoskeletal:     Cervical back: Normal range of motion.  Neurological: She is alert.  Skin: Skin is warm.     Assessment/Plan NLD obstruction ou:  Probing and Irrigation ou c bilateral crawford tube implants under general anesthesia.  09/03/2010, MD 02/26/2019, 3:35 PM

## 2019-02-27 ENCOUNTER — Ambulatory Visit: Payer: Medicaid Other | Admitting: Podiatry

## 2019-02-28 ENCOUNTER — Inpatient Hospital Stay: Admit: 2019-02-28 | Payer: Medicaid Other | Admitting: Orthopaedic Surgery

## 2019-02-28 ENCOUNTER — Ambulatory Visit: Payer: Medicaid Other | Admitting: Internal Medicine

## 2019-02-28 SURGERY — ARTHROPLASTY, HIP, TOTAL, ANTERIOR APPROACH
Anesthesia: Spinal | Site: Hip | Laterality: Left

## 2019-03-01 ENCOUNTER — Encounter (HOSPITAL_BASED_OUTPATIENT_CLINIC_OR_DEPARTMENT_OTHER)
Admission: RE | Disposition: A | Discharge status: Home / Self Care | Payer: Self-pay | Source: Home / Self Care | Attending: Ophthalmology

## 2019-03-01 ENCOUNTER — Encounter (HOSPITAL_BASED_OUTPATIENT_CLINIC_OR_DEPARTMENT_OTHER): Payer: Self-pay | Admitting: Ophthalmology

## 2019-03-01 ENCOUNTER — Ambulatory Visit (HOSPITAL_BASED_OUTPATIENT_CLINIC_OR_DEPARTMENT_OTHER)
Admission: RE | Admit: 2019-03-01 | Discharge: 2019-03-01 | Disposition: A | Discharge status: Home / Self Care | Payer: Medicaid Other | Attending: Ophthalmology | Admitting: Ophthalmology

## 2019-03-01 ENCOUNTER — Telehealth: Payer: Self-pay | Admitting: Internal Medicine

## 2019-03-01 ENCOUNTER — Ambulatory Visit (HOSPITAL_BASED_OUTPATIENT_CLINIC_OR_DEPARTMENT_OTHER): Payer: Medicaid Other | Admitting: Physician Assistant

## 2019-03-01 ENCOUNTER — Other Ambulatory Visit: Payer: Self-pay

## 2019-03-01 DIAGNOSIS — Z886 Allergy status to analgesic agent status: Secondary | ICD-10-CM | POA: Insufficient documentation

## 2019-03-01 DIAGNOSIS — Z8249 Family history of ischemic heart disease and other diseases of the circulatory system: Secondary | ICD-10-CM | POA: Diagnosis not present

## 2019-03-01 DIAGNOSIS — H04223 Epiphora due to insufficient drainage, bilateral lacrimal glands: Secondary | ICD-10-CM | POA: Diagnosis not present

## 2019-03-01 DIAGNOSIS — H04559 Acquired stenosis of unspecified nasolacrimal duct: Secondary | ICD-10-CM | POA: Diagnosis not present

## 2019-03-01 DIAGNOSIS — F17210 Nicotine dependence, cigarettes, uncomplicated: Secondary | ICD-10-CM | POA: Insufficient documentation

## 2019-03-01 DIAGNOSIS — H04203 Unspecified epiphora, bilateral lacrimal glands: Secondary | ICD-10-CM | POA: Insufficient documentation

## 2019-03-01 DIAGNOSIS — Z91013 Allergy to seafood: Secondary | ICD-10-CM | POA: Diagnosis not present

## 2019-03-01 DIAGNOSIS — H109 Unspecified conjunctivitis: Secondary | ICD-10-CM | POA: Diagnosis not present

## 2019-03-01 DIAGNOSIS — H04553 Acquired stenosis of bilateral nasolacrimal duct: Secondary | ICD-10-CM | POA: Insufficient documentation

## 2019-03-01 DIAGNOSIS — Z87892 Personal history of anaphylaxis: Secondary | ICD-10-CM | POA: Diagnosis not present

## 2019-03-01 DIAGNOSIS — I10 Essential (primary) hypertension: Secondary | ICD-10-CM | POA: Insufficient documentation

## 2019-03-01 DIAGNOSIS — J449 Chronic obstructive pulmonary disease, unspecified: Secondary | ICD-10-CM | POA: Insufficient documentation

## 2019-03-01 DIAGNOSIS — E119 Type 2 diabetes mellitus without complications: Secondary | ICD-10-CM | POA: Diagnosis not present

## 2019-03-01 DIAGNOSIS — K649 Unspecified hemorrhoids: Secondary | ICD-10-CM | POA: Diagnosis not present

## 2019-03-01 HISTORY — PX: TEAR DUCT PROBING: SHX793

## 2019-03-01 HISTORY — PX: LACRIMAL TUBE INSERTION: SHX1905

## 2019-03-01 LAB — POCT I-STAT, CHEM 8
BUN: 5 mg/dL — ABNORMAL LOW (ref 6–20)
Calcium, Ion: 1.22 mmol/L (ref 1.15–1.40)
Chloride: 99 mmol/L (ref 98–111)
Creatinine, Ser: 0.8 mg/dL (ref 0.44–1.00)
Glucose, Bld: 99 mg/dL (ref 70–99)
HCT: 41 % (ref 36.0–46.0)
Hemoglobin: 13.9 g/dL (ref 12.0–15.0)
Potassium: 3.9 mmol/L (ref 3.5–5.1)
Sodium: 138 mmol/L (ref 135–145)
TCO2: 27 mmol/L (ref 22–32)

## 2019-03-01 LAB — GLUCOSE, CAPILLARY: Glucose-Capillary: 151 mg/dL — ABNORMAL HIGH (ref 70–99)

## 2019-03-01 SURGERY — PROBING, LACRIMAL DUCT, WITH IRRIGATION
Anesthesia: General | Site: Eye | Laterality: Bilateral

## 2019-03-01 MED ORDER — GLYCOPYRROLATE PF 0.2 MG/ML IJ SOSY
PREFILLED_SYRINGE | INTRAMUSCULAR | Status: AC
  Filled 2019-03-01: qty 1

## 2019-03-01 MED ORDER — POVIDONE-IODINE 5 % OP SOLN
OPHTHALMIC | Status: DC | PRN
  Administered 2019-03-01: 1 via OPHTHALMIC

## 2019-03-01 MED ORDER — BSS IO SOLN
INTRAOCULAR | Status: DC | PRN
  Administered 2019-03-01: 15 mL via INTRAOCULAR

## 2019-03-01 MED ORDER — GLYCOPYRROLATE 0.2 MG/ML IJ SOLN
INTRAMUSCULAR | Status: DC | PRN
  Administered 2019-03-01: .2 mg via INTRAVENOUS

## 2019-03-01 MED ORDER — KETOROLAC TROMETHAMINE 30 MG/ML IJ SOLN
INTRAMUSCULAR | Status: AC
  Filled 2019-03-01: qty 1

## 2019-03-01 MED ORDER — CIPROFLOXACIN HCL 0.3 % OP SOLN
1.0000 [drp] | OPHTHALMIC | Status: DC
  Filled 2019-03-01: qty 2.5

## 2019-03-01 MED ORDER — FLUORESCEIN SODIUM 1 MG OP STRP
ORAL_STRIP | OPHTHALMIC | Status: DC | PRN
  Administered 2019-03-01: 1 via OPHTHALMIC

## 2019-03-01 MED ORDER — TOBRAMYCIN-DEXAMETHASONE 0.3-0.1 % OP OINT
TOPICAL_OINTMENT | OPHTHALMIC | Status: DC | PRN
  Administered 2019-03-01: 1 via OPHTHALMIC

## 2019-03-01 MED ORDER — DEXAMETHASONE SODIUM PHOSPHATE 10 MG/ML IJ SOLN
INTRAMUSCULAR | Status: AC
  Filled 2019-03-01: qty 1

## 2019-03-01 MED ORDER — OXYMETAZOLINE HCL 0.05 % NA SOLN
NASAL | Status: DC | PRN
  Administered 2019-03-01: 1

## 2019-03-01 MED ORDER — LACTATED RINGERS IV SOLN
INTRAVENOUS | Status: DC
  Administered 2019-03-01: 13:00:00 50 mL/h via INTRAVENOUS
  Filled 2019-03-01 (×2): qty 1000

## 2019-03-01 MED ORDER — FENTANYL CITRATE (PF) 100 MCG/2ML IJ SOLN
INTRAMUSCULAR | Status: DC | PRN
  Administered 2019-03-01 (×8): 25 ug via INTRAVENOUS

## 2019-03-01 MED ORDER — TOBRADEX 0.3-0.1 % OP OINT: 1.0000 "application " | TOPICAL_OINTMENT | Freq: Two times a day (BID) | OPHTHALMIC | 0 refills | Status: DC

## 2019-03-01 MED ORDER — FENTANYL CITRATE (PF) 100 MCG/2ML IJ SOLN
INTRAMUSCULAR | Status: AC
  Filled 2019-03-01: qty 2

## 2019-03-01 MED ORDER — ONDANSETRON HCL 4 MG/2ML IJ SOLN
INTRAMUSCULAR | Status: DC | PRN
  Administered 2019-03-01: 4 mg via INTRAVENOUS

## 2019-03-01 MED ORDER — DEXAMETHASONE SODIUM PHOSPHATE 4 MG/ML IJ SOLN
INTRAMUSCULAR | Status: DC | PRN
  Administered 2019-03-01: 5 mg via INTRAVENOUS

## 2019-03-01 MED ORDER — LIDOCAINE HCL (CARDIAC) PF 100 MG/5ML IV SOSY
PREFILLED_SYRINGE | INTRAVENOUS | Status: DC | PRN
  Administered 2019-03-01: 60 mg via INTRAVENOUS

## 2019-03-01 MED ORDER — CIPROFLOXACIN HCL 0.3 % OP SOLN
OPHTHALMIC | Status: DC | PRN
  Administered 2019-03-01: 1 [drp] via OPHTHALMIC

## 2019-03-01 MED ORDER — PROPOFOL 10 MG/ML IV BOLUS
INTRAVENOUS | Status: AC
  Filled 2019-03-01: qty 40

## 2019-03-01 MED ORDER — LIDOCAINE 2% (20 MG/ML) 5 ML SYRINGE
INTRAMUSCULAR | Status: AC
  Filled 2019-03-01: qty 5

## 2019-03-01 MED ORDER — PROPOFOL 10 MG/ML IV BOLUS
INTRAVENOUS | Status: DC | PRN
  Administered 2019-03-01: 200 mg via INTRAVENOUS
  Administered 2019-03-01 (×2): 20 mg via INTRAVENOUS
  Administered 2019-03-01: 100 mg via INTRAVENOUS

## 2019-03-01 MED ORDER — ONDANSETRON HCL 4 MG/2ML IJ SOLN
INTRAMUSCULAR | Status: AC
  Filled 2019-03-01: qty 2

## 2019-03-01 SURGICAL SUPPLY — 31 items
APPLICATOR COTTON TIP 6 STRL (MISCELLANEOUS) ×1 IMPLANT
APPLICATOR COTTON TIP 6IN STRL (MISCELLANEOUS) ×2
APPLICATOR DR MATTHEWS STRL (MISCELLANEOUS) ×2 IMPLANT
BAND RUBBER #16 2-1/2X1/16 STR (MISCELLANEOUS) ×2 IMPLANT
CANISTER SUCT 3000ML PPV (MISCELLANEOUS) ×2 IMPLANT
CANNULA FLUORO TIP TAPE (MISCELLANEOUS) IMPLANT
CATH SUCT ARGYLE CHIMNEY 10FR (CATHETERS) ×2 IMPLANT
CONT SPEC 4OZ CLIKSEAL STRL BL (MISCELLANEOUS) ×4 IMPLANT
COVER BACK TABLE 60X90IN (DRAPES) ×2 IMPLANT
COVER MAYO STAND STRL (DRAPES) ×2 IMPLANT
COVER SURGICAL LIGHT HANDLE (MISCELLANEOUS) ×2 IMPLANT
Crawford Lacrimal Intubation set ×4 IMPLANT
DRAPE SHEET LG 3/4 BI-LAMINATE (DRAPES) ×2 IMPLANT
DRAPE SURG 17X23 STRL (DRAPES) ×2 IMPLANT
GLOVE ECLIPSE 7.5 STRL STRAW (GLOVE) ×4 IMPLANT
GOWN STRL REUS W/ TWL LRG LVL3 (GOWN DISPOSABLE) ×1 IMPLANT
GOWN STRL REUS W/ TWL XL LVL3 (GOWN DISPOSABLE) ×1 IMPLANT
GOWN STRL REUS W/TWL LRG LVL3 (GOWN DISPOSABLE) ×1
GOWN STRL REUS W/TWL XL LVL3 (GOWN DISPOSABLE) ×1
KIT SUCTION CATH 14FR (SUCTIONS) ×2 IMPLANT
KIT TURNOVER CYSTO (KITS) ×2 IMPLANT
NDL SAFETY ECLIPSE 18X1.5 (NEEDLE) ×1 IMPLANT
NEEDLE HYPO 18GX1.5 SHARP (NEEDLE) ×1
NS IRRIG 500ML POUR BTL (IV SOLUTION) ×2 IMPLANT
PACK BASIN DAY SURGERY FS (CUSTOM PROCEDURE TRAY) ×2 IMPLANT
PATTIES SURGICAL .5 X3 (DISPOSABLE) ×2 IMPLANT
SET INTBT LACRIMAL .016X.025 (DRAIN) ×4 IMPLANT
STRIP CLOSURE SKIN 1/2X4 (GAUZE/BANDAGES/DRESSINGS) ×2 IMPLANT
SYR 3ML 18GX1 1/2 (SYRINGE) ×6 IMPLANT
TUBE CONNECTING 12X1/4 (SUCTIONS) ×2 IMPLANT
WATER STERILE IRR 500ML POUR (IV SOLUTION) ×2 IMPLANT

## 2019-03-01 NOTE — Interval H&P Note (Signed)
History and Physical Interval Note:  03/01/2019 10:08 AM  Stacy [name]  has presented today for surgery, with the diagnosis of NASAL LACRIMAL OBSTRUCTION.  The various methods of treatment have been discussed with the patient and family. After consideration of risks, benefits and other options for treatment, the patient has consented to  Procedure(s): TEAR DUCT PROBING WITH IRRIGATION (Bilateral) LACRIMAL TUBE INSERTION (Bilateral) as a surgical intervention.  The patient's history has been reviewed, patient examined, no change in status, stable for surgery.  I have reviewed the patient's chart and labs.  Questions were answered to the patient's satisfaction.     Aura Camps

## 2019-03-01 NOTE — Anesthesia Preprocedure Evaluation (Signed)
Anesthesia Evaluation  Patient identified by MRN, date of birth, ID band Patient awake    Reviewed: Allergy & Precautions, NPO status , Patient's Chart, lab work & pertinent test results  History of Anesthesia Complications Negative for: history of anesthetic complications  Airway Mallampati: III  TM Distance: >3 FB Neck ROM: Full    Dental no notable dental hx.    Pulmonary asthma , COPD, Current Smoker and Patient abstained from smoking.,    Pulmonary exam normal breath sounds clear to auscultation       Cardiovascular Exercise Tolerance: Poor hypertension, Pt. on medications Normal cardiovascular exam Rhythm:Regular Rate:Normal     Neuro/Psych  Headaches, negative psych ROS   GI/Hepatic Neg liver ROS, GERD  ,  Endo/Other  diabetes, Type 2  Renal/GU negative Renal ROS  negative genitourinary   Musculoskeletal  (+) Arthritis ,   Abdominal   Peds  Hematology negative hematology ROS (+)   Anesthesia Other Findings On Plavix instead of aspirin for primary prevention d/t aspirin intolerance. No known h/o MI/CVA/PVD/blood clot  Reproductive/Obstetrics                             Anesthesia Physical  Anesthesia Plan  ASA: III  Anesthesia Plan: General   Post-op Pain Management:    Induction: Intravenous  PONV Risk Score and Plan: 2 and Ondansetron, Midazolam and Treatment may vary due to age or medical condition  Airway Management Planned: LMA  Additional Equipment: None  Intra-op Plan:   Post-operative Plan: Extubation in OR  Informed Consent: I have reviewed the patients History and Physical, chart, labs and discussed the procedure including the risks, benefits and alternatives for the proposed anesthesia with the patient or authorized representative who has indicated his/her understanding and acceptance.     Dental advisory given  Plan Discussed with:   Anesthesia Plan  Comments:         Anesthesia Quick Evaluation

## 2019-03-01 NOTE — Telephone Encounter (Signed)
Attempted to call patient to schedule fu visit per pcp. LVM.  Patient needs a fu with pcp.

## 2019-03-01 NOTE — Transfer of Care (Signed)
Immediate Anesthesia Transfer of Care Note  Patient: Stacy [name]  Procedure(s) Performed: Procedure(s) (LRB): TEAR DUCT PROBING WITH IRRIGATION (Bilateral) LACRIMAL TUBE INSERTION (Bilateral)  Patient Location: PACU  Anesthesia Type: General  Level of Consciousness: awake, sedated, patient cooperative and responds to stimulation  Airway & Oxygen Therapy: Patient Spontanous Breathing and Patient connected to [place] and soft FM   Post-op Assessment: Report given to PACU RN, Post -op Vital signs reviewed and stable and Patient moving all extremities  Post vital signs: Reviewed and stable  Complications: No apparent anesthesia complications

## 2019-03-01 NOTE — Op Note (Signed)
Stacy [name], GEISEL MEDICAL RECORD [id number] ACCOUNT [id number] DATE OF BIRTH:18-Dec-1960 FACILITY: [facility] LOCATION: [facility] PHYSICIAN:Caileb Rhue [address]ty Court, MD  OPERATIVE REPORT  DATE OF PROCEDURE:  03/01/2019  SURGEON:  Aura Camps, MD  PREOPERATIVE DIAGNOSIS:  Nasolacrimal duct obstruction, bilateral.  PROCEDURE:  Bilateral nasolacrimal duct probing and irrigation with bilateral Crawford tube insertion.  POSTOPERATIVE DIAGNOSIS:  Nasolacrimal duct obstruction, bilateral.  INDICATIONS:  The patient is a 59 year old female with chronic nasolacrimal duct obstruction and epiphora.  This procedure was indicated to restore patency of the nasolacrimal system and to relieve the epiphora and recurrent conjunctivitis.  The risks  and benefits of the procedure were explained to the patient prior to the procedure and informed consent was obtained.  DESCRIPTION OF PROCEDURE:  The patient was taken into the operating room and placed in the supine position and both nostrils were packed with cottonoids soaked in Afrin prior to the onset of the procedure.  Next, my attention was first directed to the  left eye.  The inferior punctum was dilated and then probed using a 2.0 probe.  The probe passed through the canaliculus, through the sac and into the duct indicating patency. A Similar procedure was then performed for the superior system.  Next, a  23-gauge cannula attached to a 3 mL syringe containing fluorescein was irrigated through the system inferiorly, through the punctum, through the canaliculus, through the sac and into the duct.  Then dye was then retrieved from underneath the inferior  turbinate, indicating correct placement.The system was then irrigated with a 50% solution of Ciloxan.  An identical procedure then followed for the upper system.  Next, a Crawford tube stylet was introduced through the inferior system and  Introduced via the punctum, through the canaliculus, through  the sac and duct and retrieved from underneath the inferior turbinate of the right eye.  A similar procedure then ensued for the superior system and both stylets were then retrieved and the tubing was  then cut and anchored on a 3 mm segment of sterile rubber band.  It was then reposited underneath the inferior turbinate after ensuring adequate tension at the medial canthus of the Silastic tubing.  My attention was then directed to the fellow left  eye, where an identical procedure was then ensued, that is initially probing of the upper and lower systems with a 3.0 probe, followed by irrigation with fluorescein, indicating correct placement, followed by irrigation with a 50% solution of Ciloxan.   Next, the Crawford tubing was then implanted through the inferior system and also the superior system.  We did encounter significant difficulty in both probing and irrigating the superior system on the left side, but eventually we were able to enter the  nose and locate the stylet of the superior system and both ends of the Silastic tubings were then anchored to each other  using a sterile segment of rubber band.  At the conclusion of the procedure,  2 cottonoids soaked in Afrin were placed in each  nostril and these were left in place until the patient entered the recovery room and they were removed in the postanesthesia care unit.  TobraDex ointment was applied to the medial canthus of both eyes.  There were no apparent complications.  VN/NUANCE  D:03/01/2019 T:03/01/2019 JOB:[id number]

## 2019-03-01 NOTE — Anesthesia Procedure Notes (Signed)
Procedure Name: LMA Insertion Date/Time: 03/01/2019 10:43 AM Performed by: Jessica Priest, CRNA Pre-anesthesia Checklist: Patient identified, Emergency Drugs available, Suction available and Patient being monitored Patient Re-evaluated:Patient Re-evaluated prior to induction Oxygen Delivery Method: Circle system utilized Preoxygenation: Pre-oxygenation with 100% oxygen Induction Type: IV induction Ventilation: Mask ventilation without difficulty LMA: LMA flexible inserted LMA Size: 3.0 Number of attempts: 1 Airway Equipment and Method: Bite block Placement Confirmation: positive ETCO2 and breath sounds checked- equal and bilateral Tube secured with: Tape Dental Injury: Teeth and Oropharynx as per pre-operative assessment

## 2019-03-01 NOTE — Brief Op Note (Signed)
03/01/2019  12:42 PM  PATIENT:  Stacy [name]  59 y.o. female  PRE-OPERATIVE DIAGNOSIS:  NASAL LACRIMAL OBSTRUCTION  POST-OPERATIVE DIAGNOSIS:  NASAL LACRIMAL OBSTRUCTION  PROCEDURE:  Procedure(s): TEAR DUCT PROBING WITH IRRIGATION (Bilateral) LACRIMAL TUBE INSERTION (Bilateral)  SURGEON:  Surgeon(s) and Role:    Aura Camps, MD - Primary  PHYSICIAN ASSISTANT:   ASSISTANTS: none   ANESTHESIA:   general  EBL:  15 mL   BLOOD ADMINISTERED:none  DRAINS: none   LOCAL MEDICATIONS USED:  NONE  SPECIMEN:  No Specimen  DISPOSITION OF SPECIMEN:  N/A  COUNTS:  YES  TOURNIQUET:  * No tourniquets in log *  DICTATION: .Other Dictation: Dictation Number [id number]  PLAN OF CARE: Discharge to home after PACU  PATIENT DISPOSITION:  PACU - hemodynamically stable.   Delay start of Pharmacological VTE agent (>24hrs) due to surgical blood loss or risk of bleeding: no

## 2019-03-01 NOTE — Discharge Instructions (Signed)
D/C to home post VSS. Cool compresses ou Q15 mins as tolerated. Advance to PO as tolerated. D/C to home when VSS. F/U  X 1WK.   Post Anesthesia Home Care Instructions  Activity: Get plenty of rest for the remainder of the day. A responsible individual must stay with you for 24 hours following the procedure.  For the next 24 hours, DO NOT: -Drive a car -Advertising copywriter -Drink alcoholic beverages -Take any medication unless instructed by your physician -Make any legal decisions or sign important papers.  Meals: Start with liquid foods such as gelatin or soup. Progress to regular foods as tolerated. Avoid greasy, spicy, heavy foods. If nausea and/or vomiting occur, drink only clear liquids until the nausea and/or vomiting subsides. Call your physician if vomiting continues.  Special Instructions/Symptoms: Your throat may feel dry or sore from the anesthesia or the breathing tube placed in your throat during surgery. If this causes discomfort, gargle with warm salt water. The discomfort should disappear within 24 hours.  If you had a scopolamine patch placed behind your ear for the management of post- operative nausea and/or vomiting:  1. The medication in the patch is effective for 72 hours, after which it should be removed.  Wrap patch in a tissue and discard in the trash. Wash hands thoroughly with soap and water. 2. You may remove the patch earlier than 72 hours if you experience unpleasant side effects which may include dry mouth, dizziness or visual disturbances. 3. Avoid touching the patch. Wash your hands with soap and water after contact with the patch.

## 2019-03-02 ENCOUNTER — Ambulatory Visit: Payer: Medicaid Other | Attending: Internal Medicine

## 2019-03-02 ENCOUNTER — Other Ambulatory Visit: Payer: Self-pay

## 2019-03-02 DIAGNOSIS — J301 Allergic rhinitis due to pollen: Secondary | ICD-10-CM | POA: Diagnosis not present

## 2019-03-02 DIAGNOSIS — E1142 Type 2 diabetes mellitus with diabetic polyneuropathy: Secondary | ICD-10-CM | POA: Diagnosis not present

## 2019-03-02 DIAGNOSIS — J3089 Other allergic rhinitis: Secondary | ICD-10-CM | POA: Diagnosis not present

## 2019-03-02 DIAGNOSIS — J3081 Allergic rhinitis due to animal (cat) (dog) hair and dander: Secondary | ICD-10-CM | POA: Diagnosis not present

## 2019-03-02 NOTE — Anesthesia Postprocedure Evaluation (Signed)
Anesthesia Post Note  Patient: Magdaline Zollars Longenecker  Procedure(s) Performed: TEAR DUCT PROBING WITH IRRIGATION (Bilateral Eye) LACRIMAL TUBE INSERTION (Bilateral Eye)     Patient location during evaluation: PACU Anesthesia Type: General Level of consciousness: awake and alert Pain management: pain level controlled Vital Signs Assessment: post-procedure vital signs reviewed and stable Respiratory status: spontaneous breathing, nonlabored ventilation and respiratory function stable Cardiovascular status: blood pressure returned to baseline and stable Postop Assessment: no apparent nausea or vomiting Anesthetic complications: no    Last Vitals:  Vitals:   03/01/19 1345 03/01/19 1415  BP: (!) 163/91 (!) 162/89  Pulse: 90 89  Resp: 13 14  Temp:  37.2 C  SpO2: 95% 96%    Last Pain:  Vitals:   03/01/19 1415  TempSrc:   PainSc: 0-No pain                 Lowella Curb

## 2019-03-03 LAB — COMPREHENSIVE METABOLIC PANEL
ALT: 14 IU/L (ref 0–32)
AST: 18 IU/L (ref 0–40)
Albumin/Globulin Ratio: 1.8 (ref 1.2–2.2)
Albumin: 4.4 g/dL (ref 3.8–4.9)
Alkaline Phosphatase: 105 IU/L (ref 39–117)
BUN/Creatinine Ratio: 12 (ref 9–23)
BUN: 10 mg/dL (ref 6–24)
Bilirubin Total: <0.2 mg/dL (ref 0.0–1.2)
CO2: 24 mmol/L (ref 20–29)
Calcium: 9.6 mg/dL (ref 8.7–10.2)
Chloride: 101 mmol/L (ref 96–106)
Creatinine, Ser: 0.82 mg/dL (ref 0.57–1.00)
GFR calc Af Amer: 91 mL/min/{1.73_m2} (ref >59)
GFR calc non Af Amer: 79 mL/min/{1.73_m2} (ref >59)
Globulin, Total: 2.5 g/dL (ref 1.5–4.5)
Glucose: 136 mg/dL — ABNORMAL HIGH (ref 65–99)
Potassium: 3.9 mmol/L (ref 3.5–5.2)
Sodium: 141 mmol/L (ref 134–144)
Total Protein: 6.9 g/dL (ref 6.0–8.5)

## 2019-03-03 LAB — CBC
Hematocrit: 35.8 % (ref 34.0–46.6)
Hemoglobin: 11.7 g/dL (ref 11.1–15.9)
MCH: 27.7 pg (ref 26.6–33.0)
MCHC: 32.7 g/dL (ref 31.5–35.7)
MCV: 85 fL (ref 79–97)
Platelets: 269 10*3/uL (ref 150–450)
RBC: 4.22 x10E6/uL (ref 3.77–5.28)
RDW: 14.3 % (ref 11.7–15.4)
WBC: 10.6 10*3/uL (ref 3.4–10.8)

## 2019-03-03 LAB — HEMOGLOBIN A1C
Est. average glucose Bld gHb Est-mCnc: 146 mg/dL
Hgb A1c MFr Bld: 6.7 % — ABNORMAL HIGH (ref 4.8–5.6)

## 2019-03-06 ENCOUNTER — Telehealth: Payer: Self-pay | Admitting: Orthopaedic Surgery

## 2019-03-06 ENCOUNTER — Ambulatory Visit: Payer: Medicaid Other | Admitting: Physical Medicine & Rehabilitation

## 2019-03-06 DIAGNOSIS — F411 Generalized anxiety disorder: Secondary | ICD-10-CM | POA: Diagnosis not present

## 2019-03-06 NOTE — Telephone Encounter (Signed)
Pt called in said she has her surgery scheduled for 03/14/19 and is wanting to let dr.blackman know that she wanted to proceed with rehab right after her surgery.   [phone]

## 2019-03-06 NOTE — Telephone Encounter (Signed)
This evening just physical therapy at home and as an outpatient order she may going to a skilled nursing facility for rehab after surgery?

## 2019-03-06 NOTE — Telephone Encounter (Signed)
Patient aware that she will have HHPT come out to her home

## 2019-03-07 ENCOUNTER — Other Ambulatory Visit: Payer: Self-pay | Admitting: Physician Assistant

## 2019-03-09 ENCOUNTER — Telehealth: Payer: Self-pay | Admitting: Orthopaedic Surgery

## 2019-03-09 DIAGNOSIS — F411 Generalized anxiety disorder: Secondary | ICD-10-CM | POA: Diagnosis not present

## 2019-03-09 NOTE — Telephone Encounter (Signed)
Spoke with Dorene Sorrow from [facility] and he will set this up for her

## 2019-03-09 NOTE — Telephone Encounter (Signed)
Patient called and stated that she needs an order for [facility] for some assistance with personal hygiene.   She wants someone to call her back asap because she needs help very bad.  [phone]

## 2019-03-09 NOTE — Progress Notes (Addendum)
[facility] - [place], [place] - [address] [place] [address] Phone: [phone] Fax: [phone]  [facility] - [place], [place] - [address] [place] [address] Phone: [phone] Fax: [phone]    Your procedure is scheduled on Tuesday, February 9th.  Report to [facility] Main Entrance "A" at 10:00 A.M., and check in at the Admitting office.  Call this number if you have problems the morning of surgery:  [phone]  Call [phone] if you have any questions prior to your surgery date Monday-Friday 8am-4pm   Remember:  Do not eat after midnight the night before your surgery  You may drink clear liquids until 9:00 A.M. the morning of your surgery.   Clear liquids allowed are: Water, Non-Citrus Juices (without pulp), Carbonated Beverages, Clear Tea, Black Coffee Only, and Gatorade   Enhanced Recovery after Surgery for Orthopedics Enhanced Recovery after Surgery is a protocol used to improve the stress on your body and your recovery after surgery.  Patient Instructions  . The night before surgery:  o No food after midnight. ONLY clear liquids after midnight  . The day of surgery (if you have diabetes): o Drink ONE (1) 10 oz bottle water as directed. o This water was given to you during your hospital  pre-op appointment visit.  o The pre-op nurse will instruct you on the time to drink the  Water depending on your surgery time.Marland Kitchen o Nothing else to drink after completing the  Water by 9:00 A.M. the morning of surgery.         If you have questions, please contact your surgeon's office.    Take these medicines the morning of surgery with A SIP OF WATER  ALLERGY RELIEF  allopurinol (ZYLOPRIM)  budesonide-formoterol (SYMBICORT) DULoxetine (CYMBALTA) fluticasone (FLONASE)/nasal spray gabapentin (NEURONTIN) pantoprazole (PROTONIX)  tiotropium (SPIRIVA HANDIHALER)   If needed - albuterol  (PROVENTIL) - bring with you the day of surgery EPINEPHrine /injection, hydrOXYzine (ATARAX/VISTARIL), methocarbamol (ROBAXIN), ondansetron (ZOFRAN),     Follow your surgeon's instructions on when to stop clopidogrel (PLAVIX).  If no instructions were given by your surgeon then you will need to call the office to get those instructions.    As of today, STOP taking any Aspirin (unless otherwise instructed by your surgeon), diclofenac sodium (VOLTAREN), meloxicam (MOBIC),   Aleve, Naproxen, Ibuprofen, Motrin, Advil, Goody's, BC's, all herbal medications, fish oil, and all vitamins.  WHAT DO I DO ABOUT MY DIABETES MEDICATION?  Do NOT  take glimepiride (AMARYL)/ oral diabetic pills the morning of surgery  HOW TO MANAGE YOUR DIABETES BEFORE AND AFTER SURGERY  Why is it important to control my blood sugar before and after surgery? . Improving blood sugar levels before and after surgery helps healing and can limit problems. . A way of improving blood sugar control is eating a healthy diet by: o  Eating less sugar and carbohydrates o  Increasing activity/exercise o  Talking with your doctor about reaching your blood sugar goals . High blood sugars (greater than 180 mg/dL) can raise your risk of infections and slow your recovery, so you will need to focus on controlling your diabetes during the weeks before surgery. . Make sure that the doctor who takes care of your diabetes knows about your planned surgery including the date and location.  How do I manage my blood sugar before surgery? . Check your blood sugar at least 4 times a day, starting 2 days before  surgery, to make sure that the level is not too high or low. . Check your blood sugar the morning of your surgery when you wake up and every 2 hours until you get to the Short Stay unit. o If your blood sugar is less than 70 mg/dL, you will need to treat for low blood sugar: - Do not take insulin. - Treat a low blood sugar (less than 70 mg/dL)  with  cup of clear juice (cranberry or apple), 4 glucose tablets, OR glucose gel. - Recheck blood sugar in 15 minutes after treatment (to make sure it is greater than 70 mg/dL). If your blood sugar is not greater than 70 mg/dL on recheck, call [phone] for further instructions. . Report your blood sugar to the short stay nurse when you get to Short Stay.  . If you are admitted to the hospital after surgery: o Your blood sugar will be checked by the staff and you will probably be given insulin after surgery (instead of oral diabetes medicines) to make sure you have good blood sugar levels. o The goal for blood sugar control after surgery is 80-180 mg/dL.  The Morning of Surgery  Do not wear jewelry, make-up or nail polish.  Do not wear lotions, powders, or perfumes/colognes, or deodorant  Do not shave 48 hours prior to surgery.  Men may shave face and neck.  Do not bring valuables to the hospital.  [facility] is not responsible for any belongings or valuables.  If you are a smoker, DO NOT Smoke 24 hours prior to surgery  If you wear a CPAP at night please bring your mask the morning of surgery   Remember that you must have someone to transport you home after your surgery, and remain with you for 24 hours if you are discharged the same day.  Please bring cases for contacts, glasses, hearing aids, dentures or bridgework because it cannot be worn into surgery.   Leave your suitcase in the car.  After surgery it may be brought to your room.  For patients admitted to the hospital, discharge time will be determined by your treatment team.  Patients discharged the day of surgery will not be allowed to drive home.   Special instructions:   [facility]- Preparing For Surgery  Before surgery, you can play an important role. Because skin is not sterile, your skin needs to be as free of germs as possible. You can reduce the number of germs on your skin by washing with CHG (chlorahexidine  gluconate) Soap before surgery.  CHG is an antiseptic cleaner which kills germs and bonds with the skin to continue killing germs even after washing.    Oral Hygiene is also important to reduce your risk of infection.  Remember - BRUSH YOUR TEETH THE MORNING OF SURGERY WITH YOUR REGULAR TOOTHPASTE  Please do not use if you have an allergy to CHG or antibacterial soaps. If your skin becomes reddened/irritated stop using the CHG.  Do not shave (including legs and underarms) for at least 48 hours prior to first CHG shower. It is OK to shave your face.  Please follow these instructions carefully.   1. Shower the NIGHT BEFORE SURGERY and the MORNING OF SURGERY with CHG Soap.   2. If you chose to wash your hair, wash your hair first as usual with your normal shampoo.  3. After you shampoo, rinse your hair and body thoroughly to remove the shampoo.  4. Use CHG as you would any  other liquid soap. You can apply CHG directly to the skin and wash gently with a scrungie or a clean washcloth.   5. Apply the CHG Soap to your body ONLY FROM THE NECK DOWN.  Do not use on open wounds or open sores. Avoid contact with your eyes, ears, mouth and genitals (private parts). Wash Face and genitals (private parts)  with your normal soap.   6. Wash thoroughly, paying special attention to the area where your surgery will be performed.  7. Thoroughly rinse your body with warm water from the neck down.  8. DO NOT shower/wash with your normal soap after using and rinsing off the CHG Soap.  9. Pat yourself dry with a CLEAN TOWEL.  10. Wear CLEAN PAJAMAS to bed the night before surgery, wear comfortable clothes the morning of surgery  11. Place CLEAN SHEETS on your bed the night of your first shower and DO NOT SLEEP WITH PETS.  Day of Surgery: Please shower the morning of surgery with the CHG soap Do not apply any deodorants/lotions. Please wear clean clothes to the hospital/surgery center.   Remember to brush  your teeth WITH YOUR REGULAR TOOTHPASTE.  Please read over the following fact sheets that you were given.

## 2019-03-10 ENCOUNTER — Other Ambulatory Visit (HOSPITAL_COMMUNITY)
Admission: RE | Admit: 2019-03-10 | Discharge: 2019-03-10 | Disposition: A | Discharge status: Home / Self Care | Payer: Medicaid Other | Source: Ambulatory Visit | Attending: Orthopaedic Surgery | Admitting: Orthopaedic Surgery

## 2019-03-10 ENCOUNTER — Encounter (HOSPITAL_COMMUNITY)
Admission: RE | Admit: 2019-03-10 | Discharge: 2019-03-10 | Disposition: A | Discharge status: Home / Self Care | Payer: Medicaid Other | Source: Ambulatory Visit | Attending: Orthopaedic Surgery | Admitting: Orthopaedic Surgery

## 2019-03-10 ENCOUNTER — Other Ambulatory Visit (HOSPITAL_COMMUNITY): Payer: Medicaid Other

## 2019-03-10 ENCOUNTER — Encounter (HOSPITAL_COMMUNITY): Payer: Self-pay

## 2019-03-10 ENCOUNTER — Other Ambulatory Visit: Payer: Self-pay

## 2019-03-10 DIAGNOSIS — Z20822 Contact with and (suspected) exposure to covid-19: Secondary | ICD-10-CM | POA: Insufficient documentation

## 2019-03-10 DIAGNOSIS — Z01812 Encounter for preprocedural laboratory examination: Secondary | ICD-10-CM | POA: Diagnosis not present

## 2019-03-10 HISTORY — DX: Anxiety disorder, unspecified: F41.9

## 2019-03-10 HISTORY — DX: Depression, unspecified: F32.A

## 2019-03-10 LAB — BASIC METABOLIC PANEL
Anion gap: 12 (ref 5–15)
BUN: 6 mg/dL (ref 6–20)
CO2: 24 mmol/L (ref 22–32)
Calcium: 9.4 mg/dL (ref 8.9–10.3)
Chloride: 100 mmol/L (ref 98–111)
Creatinine, Ser: 0.97 mg/dL (ref 0.44–1.00)
GFR calc Af Amer: >60 mL/min (ref >60)
GFR calc non Af Amer: >60 mL/min (ref >60)
Glucose, Bld: 98 mg/dL (ref 70–99)
Potassium: 3.9 mmol/L (ref 3.5–5.1)
Sodium: 136 mmol/L (ref 135–145)

## 2019-03-10 LAB — CBC
HCT: 39.7 % (ref 36.0–46.0)
Hemoglobin: 13.4 g/dL (ref 12.0–15.0)
MCH: 28.5 pg (ref 26.0–34.0)
MCHC: 33.8 g/dL (ref 30.0–36.0)
MCV: 84.3 fL (ref 80.0–100.0)
Platelets: 291 10*3/uL (ref 150–400)
RBC: 4.71 MIL/uL (ref 3.87–5.11)
RDW: 14.4 % (ref 11.5–15.5)
WBC: 8.3 10*3/uL (ref 4.0–10.5)
nRBC: 0 % (ref 0.0–0.2)

## 2019-03-10 LAB — SURGICAL PCR SCREEN
MRSA, PCR: NEGATIVE
Staphylococcus aureus: POSITIVE — AB

## 2019-03-10 LAB — SARS CORONAVIRUS 2 (TAT 6-24 HRS): SARS Coronavirus 2: NEGATIVE

## 2019-03-10 LAB — GLUCOSE, CAPILLARY: Glucose-Capillary: 134 mg/dL — ABNORMAL HIGH (ref 70–99)

## 2019-03-10 NOTE — Progress Notes (Signed)
PCP - Dr. Jonah Blue Cardiologist - denie  PPM/ICD - denies  Chest x-ray - N/A EKG - 10/14/2018 Stress Test - 2008 ECHO - denies Cardiac Cath - denies  Sleep Study - denies CPAP - N/A  Fasting Blood Sugar -  114 - 120s Checks Blood Sugar 2 times a day  Blood Thinner Instructions: Plavix: per patient no instructions given from surgeon's office.  At PAT appointment patient instructed to call the surgeon's office to get those instructions on when to stop taking.   Aspirin Instructions: N/A  ERAS Protcol - Yes PRE-SURGERY Ensure or G2- (1) 10oz bottle of water given  COVID TEST-  Test results pending 03/10/2019  Anesthesia review: Abnormal EKG, hx PAD  Patient denies shortness of breath, fever, cough and chest pain at PAT appointment  All instructions explained to the patient, with a verbal understanding of the material. Patient agrees to go over the instructions while at home for a better understanding. Patient also instructed to self quarantine after being tested for COVID-19. The opportunity to ask questions was provided.

## 2019-03-12 NOTE — Progress Notes (Signed)
Patient called and stated that her pharmacy of choice, [facility], was closed on Saturday and Sunday.  New prescription called to [facility] on [address].  Patient aware and states that she has already picked up prescription.

## 2019-03-13 DIAGNOSIS — J301 Allergic rhinitis due to pollen: Secondary | ICD-10-CM | POA: Diagnosis not present

## 2019-03-13 DIAGNOSIS — J3081 Allergic rhinitis due to animal (cat) (dog) hair and dander: Secondary | ICD-10-CM | POA: Diagnosis not present

## 2019-03-13 DIAGNOSIS — J3089 Other allergic rhinitis: Secondary | ICD-10-CM | POA: Diagnosis not present

## 2019-03-13 NOTE — Progress Notes (Signed)
Anesthesia Chart Review:  Case: [id number] Date/Time: 03/14/19 1145   Procedure: LEFT TOTAL HIP ARTHROPLASTY ANTERIOR APPROACH (Left Hip)   Anesthesia type: Spinal   Pre-op diagnosis: osteoarthritis left hip   Location: [place] OR ROOM 05 / [place] OR   Surgeons: Mcarthur Rossetti, MD      DISCUSSION: Patient is a 59 year old female scheduled for the above procedure. It appears surgery was initially scheduled for 02/28/19 but postponed due to COVID-19 restrictions/increased hospital census.   History includes smoking, COPD, asthma, dyspnea, HLD, GERD, IBS, HTN, neuropathy, glaucoma, DM2, back surgery (s/p L2-5 decompression 06/03/15; right L5-S1 microdiscectomy 10/02/16; left L3-4 microdiscectomy 07/15/18).  S/p tear duct probing and lacrimal tube insertion 03/01/19. PCP Dr. Durenda Age 02/24/19 note suggests a recent "lupus" diagnosis by a dermatologist at [facility] (Dr. [name] is awaiting for records to review diagnosis details.)   She was evaluated by vascular surgery in 04/2018 for leg cramps with abnormal toe-brachial index. No intervention recommended at that time. Reportedly, she was placed on Plavix for PAD by primary care. (She denied history of leg or cardiac stents and of stroke.) I followed up with her regarding surgeon instructions regarding ASA/Plavix. She reported last ASA and Plavix 03/06/19 in anticipation for surgery. She is smoker, but denied any recent exacerbations. She is on Singulair and Symbicort.   Negative presurgical COVID-19 test on 03/10/19. Anesthesia team to evaluate on the day of surgery.    VS: BP 118/74   Pulse 80   Temp 37.1 C (Oral)   Resp 16   Ht 5' 6"  (1.676 m)   Wt 80.7 kg   LMP 09/01/2010   SpO2 98%   BMI 28.73 kg/m   PROVIDERS: [name], MD his PCP ([place]). Last telemedicine visit 02/24/19, and she is aware of surgery plans. Patient also saw pulmonologist Asencion Noble, MD there on 12/26/18.  Monica Martinez, MD is vascular surgeon. Last visit 04/05/18. He suspected a component of small vessel disease given abnormal TBIs, but in the absence of significant rest pain or tissue loss, he did not recommend intervention at that time.  - She sees Tiajuana Amass, MD for allergy injections and a dermatologist at [facility]. Has also seen Dr. Laurence Spates with [facility].   LABS: Labs reviewed: Acceptable for surgery. A1c 6.7% 03/02/19.  (all labs ordered are listed, but only abnormal results are displayed)  Labs Reviewed  SURGICAL PCR SCREEN - Abnormal; Notable for the following components:      Result Value   Staphylococcus aureus POSITIVE (*)    All other components within normal limits  GLUCOSE, CAPILLARY - Abnormal; Notable for the following components:   Glucose-Capillary 134 (*)    All other components within normal limits  BASIC METABOLIC PANEL  CBC   Last PFTs seen are from 2013. ("PFTs 05/25/2011 FEV1  2.75 (103%) ratio 73 and DLC0 58 corrects to 67% c/w GOLD 0")   IMAGES: CXR 01/19/19: FINDINGS: The heart size and mediastinal contours are within normal limits. Both lungs are clear. The visualized skeletal structures are unremarkable. IMPRESSION: Normal exam.   EKG: 10/14/18: Normal sinus rhythm Possible Left atrial enlargement Nonspecific T wave abnormality Abnormal ECG Since previous tracing QT has shortened Confirmed by [place], Peter [phone]) on 10/15/2018 2:54:57 PM   CV: BLE arterial US/ABIs 03/03/18: Summary:  - Right: Resting right ankle-brachial index is within normal range. No  evidence of significant right lower extremity arterial disease. The right  toe-brachial index is abnormal. PPG tracings appear dampened.  - Left: Resting left ankle-brachial index is within normal range. No  evidence of significant left lower extremity arterial disease. The left  toe-brachial index is abnormal. PPG tracings appear dampened.   - Remote history of stress  test. Nuclear stress test 06/21/06: IMPRESSION:   Normal examination without evidence of pharmacologically induced myocardial ischemia. The calculated left ventricular ejection fraction is 57%.   Past Medical History:  Diagnosis Date  . Allergy    Shellfish, cleaning products  . Anxiety   . Arthritis   . 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 Asthma   . COPD (chronic obstructive pulmonary disease) ([place])   . Depression   . Diabetes mellitus    Type II  . GERD (gastroesophageal reflux disease)    Pt on Protonix daily  . Glaucoma   . Gout   . Headache(784.0)    otc meds prn  . Hyperlipidemia   . Hypertension   . Irritable bowel syndrome 11/19/2010  . Neuropathy   . Pneumonia YRS AGO  . Restless legs   . Shortness of breath    07/14/2018- uses  4 times a day    Past Surgical History:  Procedure Laterality Date  . BACK SURGERY    . CHOLECYSTECTOMY    . COLONOSCOPY    . ENDOMETRIAL ABLATION  10/2010  . EYE SURGERY    . HERNIA REPAIR     umbicial hernia  . JOINT REPLACEMENT    . KNEE ARTHROSCOPY Left    06/07/2017 Dr. Marlou Sa of [place]  . KNEE CLOSED REDUCTION Right 12/06/2015   Procedure: CLOSED MANIPULATION RIGHT KNEE;  Surgeon: [name], MD;  Location: [facility];  Service: Orthopedics;  Laterality: Right;  . KNEE CLOSED REDUCTION Right 01/17/2016   Procedure: CLOSED MANIPULATION RIGHT KNEE;  Surgeon: [name], MD;  Location: [facility];  Service: Orthopedics;  Laterality: Right;  . KNEE JOINT MANIPULATION Right 12/06/2015  . LACRIMAL TUBE INSERTION Bilateral 03/01/2019   Procedure: LACRIMAL TUBE INSERTION;  Surgeon: Gevena Cotton, MD;  [facility];  Service: Ophthalmology;  Laterality: Bilateral;  . LUMBAR DISC SURGERY  06/03/2015   L 2  L3 L4 L5   . LUMBAR LAMINECTOMY/DECOMPRESSION MICRODISCECTOMY N/A 06/03/2015   Procedure: Bilateral lateral recess decompression L2-3, L3-4, L4-5;  Surgeon: [name], MD;   Location: [facility];  Service: Orthopedics;  Laterality: N/A;  . LUMBAR LAMINECTOMY/DECOMPRESSION MICRODISCECTOMY N/A 10/02/2016   Procedure: Right L5-S1 Lateral Recess Decompression  microdiscectomy;  Surgeon: [name], MD;  Location: [facility];  Service: Orthopedics;  Laterality: N/A;  . LUMBAR LAMINECTOMY/DECOMPRESSION MICRODISCECTOMY N/A 07/15/2018   Procedure: LEFT L3-4 MICRODISCECTOMY;  Surgeon: [name], MD;  Location: [facility];  Service: Orthopedics;  Laterality: N/A;  . svd      x 2  . TEAR DUCT PROBING Bilateral 03/01/2019   Procedure: TEAR DUCT PROBING WITH IRRIGATION;  Surgeon: Gevena Cotton, MD;  Location: [facility];  Service: Ophthalmology;  Laterality: Bilateral;  . TOTAL KNEE ARTHROPLASTY Right 09/06/2015   Procedure: RIGHT TOTAL KNEE ARTHROPLASTY;  Surgeon: [name], MD;  Location: [facility];  Service: Orthopedics;  Laterality: Right;  . TUBAL LIGATION    . UPPER GASTROINTESTINAL ENDOSCOPY  04/28/11    MEDICATIONS: . Accu-Chek Softclix Lancets lancets  . albuterol (PROVENTIL) (2.5 MG/3ML) 0.083% nebulizer solution  . ALLERGY RELIEF 10 MG tablet  . allopurinol (ZYLOPRIM)  100 MG tablet  . Blood Glucose Monitoring Suppl (ACCU-CHEK AVIVA PLUS) w/Device KIT  . budesonide-formoterol (SYMBICORT) 80-4.5 MCG/ACT inhaler  . clopidogrel (PLAVIX) 75 MG tablet  . diclofenac sodium (VOLTAREN) 1 % GEL  . DULoxetine (CYMBALTA) 30 MG capsule  . EPINEPHrine 0.3 mg/0.3 mL IJ SOAJ injection  . [name] 325 (65 Fe) MG tablet  . fluticasone (FLONASE) 50 MCG/ACT nasal spray  . gabapentin (NEURONTIN) 600 MG tablet  . glimepiride (AMARYL) 2 MG tablet  . glucose blood (ACCU-CHEK AVIVA PLUS) test strip  . hydrochlorothiazide (HYDRODIURIL) 25 MG tablet  . hydrOXYzine (ATARAX/VISTARIL) 10 MG tablet  . Lancets (ACCU-CHEK SOFT TOUCH) lancets  . Melatonin 3 MG TABS  . meloxicam (MOBIC) 15 MG tablet  . methocarbamol (ROBAXIN) 750 MG tablet  . montelukast (SINGULAIR) 10 MG tablet   . Multiple Vitamin (MULTIVITAMIN WITH MINERALS) TABS tablet  . nicotine (NICODERM CQ - DOSED IN MG/24 HOURS) 14 mg/24hr patch  . nitrofurantoin, macrocrystal-monohydrate, (MACROBID) 100 MG capsule  . ondansetron (ZOFRAN) 4 MG tablet  . pantoprazole (PROTONIX) 40 MG tablet  . Potassium Chloride ER 20 MEQ TBCR  . Respiratory Therapy Supplies (FLUTTER) DEVI  . senna-docusate (SENOKOT S) 8.6-50 MG tablet  . simvastatin (ZOCOR) 20 MG tablet  . tiotropium (SPIRIVA HANDIHALER) 18 MCG inhalation capsule  . tobramycin-dexamethasone (TOBRADEX) ophthalmic ointment  . triamcinolone (KENALOG) 0.025 % cream  . valsartan-hydrochlorothiazide (DIOVAN-HCT) 160-12.5 MG tablet   No current facility-administered medications for this encounter.  She is not taking Macrobid.    Myra Gianotti, PA-C Surgical Short Stay/Anesthesiology [facility] Phone [phone] [facility] Phone [phone] 03/13/2019 12:51 PM

## 2019-03-14 ENCOUNTER — Inpatient Hospital Stay (HOSPITAL_COMMUNITY): Payer: Medicaid Other

## 2019-03-14 ENCOUNTER — Inpatient Hospital Stay (HOSPITAL_COMMUNITY): Payer: Medicaid Other | Admitting: Vascular Surgery

## 2019-03-14 ENCOUNTER — Encounter (HOSPITAL_COMMUNITY)
Admission: RE | Disposition: A | Discharge status: Home / Self Care | Payer: Self-pay | Source: Home / Self Care | Attending: Orthopaedic Surgery

## 2019-03-14 ENCOUNTER — Observation Stay (HOSPITAL_COMMUNITY)
Admission: RE | Admit: 2019-03-14 | Discharge: 2019-03-15 | Disposition: A | Discharge status: Home / Self Care | Payer: Medicaid Other | Attending: Orthopaedic Surgery | Admitting: Orthopaedic Surgery

## 2019-03-14 ENCOUNTER — Other Ambulatory Visit: Payer: Self-pay

## 2019-03-14 ENCOUNTER — Inpatient Hospital Stay (HOSPITAL_COMMUNITY): Payer: Medicaid Other | Admitting: Certified Registered Nurse Anesthetist

## 2019-03-14 ENCOUNTER — Inpatient Hospital Stay: Payer: Medicaid Other | Admitting: Orthopaedic Surgery

## 2019-03-14 ENCOUNTER — Encounter (HOSPITAL_COMMUNITY): Payer: Self-pay | Admitting: Orthopaedic Surgery

## 2019-03-14 DIAGNOSIS — Z96642 Presence of left artificial hip joint: Secondary | ICD-10-CM | POA: Diagnosis not present

## 2019-03-14 DIAGNOSIS — I10 Essential (primary) hypertension: Secondary | ICD-10-CM | POA: Insufficient documentation

## 2019-03-14 DIAGNOSIS — G8929 Other chronic pain: Secondary | ICD-10-CM | POA: Insufficient documentation

## 2019-03-14 DIAGNOSIS — J4540 Moderate persistent asthma, uncomplicated: Secondary | ICD-10-CM | POA: Diagnosis not present

## 2019-03-14 DIAGNOSIS — E1142 Type 2 diabetes mellitus with diabetic polyneuropathy: Secondary | ICD-10-CM | POA: Diagnosis not present

## 2019-03-14 DIAGNOSIS — E669 Obesity, unspecified: Secondary | ICD-10-CM | POA: Diagnosis not present

## 2019-03-14 DIAGNOSIS — Z91013 Allergy to seafood: Secondary | ICD-10-CM | POA: Insufficient documentation

## 2019-03-14 DIAGNOSIS — Z888 Allergy status to other drugs, medicaments and biological substances status: Secondary | ICD-10-CM | POA: Diagnosis not present

## 2019-03-14 DIAGNOSIS — Z886 Allergy status to analgesic agent status: Secondary | ICD-10-CM | POA: Insufficient documentation

## 2019-03-14 DIAGNOSIS — F17210 Nicotine dependence, cigarettes, uncomplicated: Secondary | ICD-10-CM | POA: Diagnosis not present

## 2019-03-14 DIAGNOSIS — Z6827 Body mass index (BMI) 27.0-27.9, adult: Secondary | ICD-10-CM | POA: Diagnosis not present

## 2019-03-14 DIAGNOSIS — Z471 Aftercare following joint replacement surgery: Secondary | ICD-10-CM | POA: Diagnosis not present

## 2019-03-14 DIAGNOSIS — Z419 Encounter for procedure for purposes other than remedying health state, unspecified: Secondary | ICD-10-CM

## 2019-03-14 DIAGNOSIS — K219 Gastro-esophageal reflux disease without esophagitis: Secondary | ICD-10-CM | POA: Diagnosis not present

## 2019-03-14 DIAGNOSIS — Z79899 Other long term (current) drug therapy: Secondary | ICD-10-CM | POA: Insufficient documentation

## 2019-03-14 DIAGNOSIS — M1612 Unilateral primary osteoarthritis, left hip: Principal | ICD-10-CM | POA: Insufficient documentation

## 2019-03-14 DIAGNOSIS — J449 Chronic obstructive pulmonary disease, unspecified: Secondary | ICD-10-CM | POA: Insufficient documentation

## 2019-03-14 HISTORY — PX: TOTAL HIP ARTHROPLASTY: SHX124

## 2019-03-14 HISTORY — PX: JOINT REPLACEMENT: SHX530

## 2019-03-14 LAB — GLUCOSE, CAPILLARY
Glucose-Capillary: 146 mg/dL — ABNORMAL HIGH (ref 70–99)
Glucose-Capillary: 162 mg/dL — ABNORMAL HIGH (ref 70–99)
Glucose-Capillary: 180 mg/dL — ABNORMAL HIGH (ref 70–99)
Glucose-Capillary: 168 mg/dL — ABNORMAL HIGH (ref 70–99)

## 2019-03-14 SURGERY — ARTHROPLASTY, HIP, TOTAL, ANTERIOR APPROACH
Anesthesia: General | Site: Hip | Laterality: Left

## 2019-03-14 MED ORDER — ROCURONIUM BROMIDE 10 MG/ML (PF) SYRINGE
PREFILLED_SYRINGE | INTRAVENOUS | Status: AC
  Filled 2019-03-14: qty 10

## 2019-03-14 MED ORDER — ALUM & MAG HYDROXIDE-SIMETH 200-200-20 MG/5ML PO SUSP: 30.0000 mL | ORAL | Status: DC | PRN

## 2019-03-14 MED ORDER — LACTATED RINGERS IV SOLN: INTRAVENOUS | Status: DC | PRN

## 2019-03-14 MED ORDER — DEXAMETHASONE SODIUM PHOSPHATE 10 MG/ML IJ SOLN
INTRAMUSCULAR | Status: DC | PRN
  Administered 2019-03-14: 4 mg via INTRAVENOUS

## 2019-03-14 MED ORDER — ONDANSETRON HCL 4 MG/2ML IJ SOLN: 4.0000 mg | Freq: Once | INTRAMUSCULAR | Status: DC | PRN

## 2019-03-14 MED ORDER — HYDROCHLOROTHIAZIDE 12.5 MG PO CAPS
12.5000 mg | ORAL_CAPSULE | Freq: Every day | ORAL | Status: DC
  Administered 2019-03-14 – 2019-03-15 (×2): 12.5 mg via ORAL
  Filled 2019-03-14 (×2): qty 1

## 2019-03-14 MED ORDER — FERROUS SULFATE 325 (65 FE) MG PO TABS
325.0000 mg | ORAL_TABLET | Freq: Two times a day (BID) | ORAL | Status: DC
  Administered 2019-03-14 – 2019-03-15 (×3): 325 mg via ORAL
  Filled 2019-03-14 (×3): qty 1

## 2019-03-14 MED ORDER — HYDROMORPHONE HCL 1 MG/ML IJ SOLN
INTRAMUSCULAR | Status: AC
  Filled 2019-03-14: qty 0.5

## 2019-03-14 MED ORDER — POVIDONE-IODINE 10 % EX SWAB: 2.0000 "application " | Freq: Once | CUTANEOUS | Status: DC

## 2019-03-14 MED ORDER — FLUTICASONE PROPIONATE 50 MCG/ACT NA SUSP
1.0000 | Freq: Every day | NASAL | Status: DC
  Filled 2019-03-14: qty 16

## 2019-03-14 MED ORDER — ONDANSETRON HCL 4 MG PO TABS: 4.0000 mg | ORAL_TABLET | Freq: Four times a day (QID) | ORAL | Status: DC | PRN

## 2019-03-14 MED ORDER — DOCUSATE SODIUM 100 MG PO CAPS
100.0000 mg | ORAL_CAPSULE | Freq: Two times a day (BID) | ORAL | Status: DC
  Administered 2019-03-14 – 2019-03-15 (×3): 100 mg via ORAL
  Filled 2019-03-14 (×3): qty 1

## 2019-03-14 MED ORDER — 0.9 % SODIUM CHLORIDE (POUR BTL) OPTIME
TOPICAL | Status: DC | PRN
  Administered 2019-03-14: 13:00:00 1000 mL

## 2019-03-14 MED ORDER — INSULIN ASPART 100 UNIT/ML ~~LOC~~ SOLN: 0.0000 [IU] | Freq: Every day | SUBCUTANEOUS | Status: DC

## 2019-03-14 MED ORDER — ROCURONIUM BROMIDE 10 MG/ML (PF) SYRINGE
PREFILLED_SYRINGE | INTRAVENOUS | Status: DC | PRN
  Administered 2019-03-14: 80 mg via INTRAVENOUS

## 2019-03-14 MED ORDER — ALLOPURINOL 100 MG PO TABS
100.0000 mg | ORAL_TABLET | Freq: Every day | ORAL | Status: DC
  Administered 2019-03-15: 100 mg via ORAL
  Filled 2019-03-14: qty 1

## 2019-03-14 MED ORDER — METOCLOPRAMIDE HCL 5 MG PO TABS
5.0000 mg | ORAL_TABLET | Freq: Three times a day (TID) | ORAL | Status: DC | PRN
  Filled 2019-03-14: qty 1

## 2019-03-14 MED ORDER — HYDROCHLOROTHIAZIDE 25 MG PO TABS
25.0000 mg | ORAL_TABLET | Freq: Every day | ORAL | Status: DC
  Administered 2019-03-14 – 2019-03-15 (×2): 25 mg via ORAL
  Filled 2019-03-14 (×2): qty 1

## 2019-03-14 MED ORDER — HYDROXYZINE HCL 10 MG PO TABS
10.0000 mg | ORAL_TABLET | Freq: Three times a day (TID) | ORAL | Status: DC | PRN
  Administered 2019-03-15: 20 mg via ORAL
  Filled 2019-03-14 (×2): qty 2

## 2019-03-14 MED ORDER — IRBESARTAN 150 MG PO TABS
150.0000 mg | ORAL_TABLET | Freq: Every day | ORAL | Status: DC
  Administered 2019-03-14 – 2019-03-15 (×2): 150 mg via ORAL
  Filled 2019-03-14 (×2): qty 1

## 2019-03-14 MED ORDER — CHLORHEXIDINE GLUCONATE 4 % EX LIQD: 60.0000 mL | Freq: Once | CUTANEOUS | Status: DC

## 2019-03-14 MED ORDER — GLIMEPIRIDE 2 MG PO TABS
2.0000 mg | ORAL_TABLET | Freq: Every day | ORAL | Status: DC
  Administered 2019-03-14 – 2019-03-15 (×2): 2 mg via ORAL
  Filled 2019-03-14 (×2): qty 1

## 2019-03-14 MED ORDER — SIMVASTATIN 20 MG PO TABS
20.0000 mg | ORAL_TABLET | Freq: Every day | ORAL | Status: DC
  Administered 2019-03-14: 20 mg via ORAL
  Filled 2019-03-14: qty 1

## 2019-03-14 MED ORDER — ADULT MULTIVITAMIN W/MINERALS CH
1.0000 | ORAL_TABLET | Freq: Every day | ORAL | Status: DC
  Administered 2019-03-15: 1 via ORAL
  Filled 2019-03-14: qty 1

## 2019-03-14 MED ORDER — ACETAMINOPHEN 160 MG/5ML PO SOLN: 325.0000 mg | ORAL | Status: DC | PRN

## 2019-03-14 MED ORDER — INSULIN ASPART 100 UNIT/ML ~~LOC~~ SOLN
0.0000 [IU] | Freq: Three times a day (TID) | SUBCUTANEOUS | Status: DC
  Administered 2019-03-14 – 2019-03-15 (×2): 3 [IU] via SUBCUTANEOUS

## 2019-03-14 MED ORDER — TRANEXAMIC ACID-NACL 1000-0.7 MG/100ML-% IV SOLN
INTRAVENOUS | Status: AC
  Filled 2019-03-14: qty 100

## 2019-03-14 MED ORDER — DIPHENHYDRAMINE HCL 12.5 MG/5ML PO ELIX
12.5000 mg | ORAL_SOLUTION | ORAL | Status: DC | PRN
  Filled 2019-03-14: qty 10

## 2019-03-14 MED ORDER — HYDROMORPHONE HCL 1 MG/ML IJ SOLN
INTRAMUSCULAR | Status: DC | PRN
  Administered 2019-03-14: .5 mg via INTRAVENOUS

## 2019-03-14 MED ORDER — CLOPIDOGREL BISULFATE 75 MG PO TABS
75.0000 mg | ORAL_TABLET | Freq: Every day | ORAL | Status: DC
  Administered 2019-03-14 – 2019-03-15 (×2): 75 mg via ORAL
  Filled 2019-03-14 (×2): qty 1

## 2019-03-14 MED ORDER — METOCLOPRAMIDE HCL 5 MG/ML IJ SOLN: 5.0000 mg | Freq: Three times a day (TID) | INTRAMUSCULAR | Status: DC | PRN

## 2019-03-14 MED ORDER — FENTANYL CITRATE (PF) 100 MCG/2ML IJ SOLN
25.0000 ug | INTRAMUSCULAR | Status: DC | PRN
  Administered 2019-03-14: 50 ug via INTRAVENOUS
  Administered 2019-03-14 (×2): 25 ug via INTRAVENOUS

## 2019-03-14 MED ORDER — ACETAMINOPHEN 10 MG/ML IV SOLN
INTRAVENOUS | Status: DC | PRN
  Administered 2019-03-14: 1000 mg via INTRAVENOUS

## 2019-03-14 MED ORDER — ALBUTEROL SULFATE (2.5 MG/3ML) 0.083% IN NEBU: 2.5000 mg | INHALATION_SOLUTION | Freq: Four times a day (QID) | RESPIRATORY_TRACT | Status: DC | PRN

## 2019-03-14 MED ORDER — ASPIRIN 81 MG PO CHEW
81.0000 mg | CHEWABLE_TABLET | Freq: Every day | ORAL | Status: DC
  Administered 2019-03-14 – 2019-03-15 (×2): 81 mg via ORAL
  Filled 2019-03-14 (×2): qty 1

## 2019-03-14 MED ORDER — GABAPENTIN 600 MG PO TABS
600.0000 mg | ORAL_TABLET | Freq: Three times a day (TID) | ORAL | Status: DC
  Administered 2019-03-14 – 2019-03-15 (×3): 600 mg via ORAL
  Filled 2019-03-14 (×3): qty 1

## 2019-03-14 MED ORDER — OXYCODONE HCL 5 MG PO TABS
5.0000 mg | ORAL_TABLET | ORAL | Status: DC | PRN
  Administered 2019-03-14 – 2019-03-15 (×5): 10 mg via ORAL
  Filled 2019-03-14 (×3): qty 2

## 2019-03-14 MED ORDER — DEXMEDETOMIDINE HCL 200 MCG/2ML IV SOLN
INTRAVENOUS | Status: DC | PRN
  Administered 2019-03-14: 12 ug via INTRAVENOUS

## 2019-03-14 MED ORDER — TIOTROPIUM BROMIDE MONOHYDRATE 18 MCG IN CAPS: 18.0000 ug | ORAL_CAPSULE | Freq: Every day | RESPIRATORY_TRACT | Status: DC

## 2019-03-14 MED ORDER — MEPERIDINE HCL 25 MG/ML IJ SOLN: 6.2500 mg | INTRAMUSCULAR | Status: DC | PRN

## 2019-03-14 MED ORDER — OXYCODONE HCL 5 MG/5ML PO SOLN: 5.0000 mg | Freq: Once | ORAL | Status: DC | PRN

## 2019-03-14 MED ORDER — POLYETHYLENE GLYCOL 3350 17 G PO PACK: 17.0000 g | PACK | Freq: Every day | ORAL | Status: DC | PRN

## 2019-03-14 MED ORDER — FENTANYL CITRATE (PF) 250 MCG/5ML IJ SOLN
INTRAMUSCULAR | Status: AC
  Filled 2019-03-14: qty 5

## 2019-03-14 MED ORDER — ONDANSETRON HCL 4 MG/2ML IJ SOLN: 4.0000 mg | Freq: Four times a day (QID) | INTRAMUSCULAR | Status: DC | PRN

## 2019-03-14 MED ORDER — MENTHOL 3 MG MT LOZG: 1.0000 | LOZENGE | OROMUCOSAL | Status: DC | PRN

## 2019-03-14 MED ORDER — ACETAMINOPHEN 10 MG/ML IV SOLN
INTRAVENOUS | Status: AC
  Filled 2019-03-14: qty 100

## 2019-03-14 MED ORDER — OXYCODONE HCL 5 MG PO TABS
10.0000 mg | ORAL_TABLET | ORAL | Status: DC | PRN
  Filled 2019-03-14 (×2): qty 2

## 2019-03-14 MED ORDER — PROPOFOL 10 MG/ML IV BOLUS
INTRAVENOUS | Status: DC | PRN
  Administered 2019-03-14: 120 mg via INTRAVENOUS

## 2019-03-14 MED ORDER — TRANEXAMIC ACID-NACL 1000-0.7 MG/100ML-% IV SOLN
1000.0000 mg | INTRAVENOUS | Status: AC
  Administered 2019-03-14: 12:00:00 1000 mg via INTRAVENOUS

## 2019-03-14 MED ORDER — SODIUM CHLORIDE 0.9 % IR SOLN
Status: DC | PRN
  Administered 2019-03-14: 3000 mL

## 2019-03-14 MED ORDER — MONTELUKAST SODIUM 10 MG PO TABS
10.0000 mg | ORAL_TABLET | Freq: Every day | ORAL | Status: DC
  Administered 2019-03-14: 10 mg via ORAL
  Filled 2019-03-14 (×2): qty 1

## 2019-03-14 MED ORDER — SUGAMMADEX SODIUM 200 MG/2ML IV SOLN
INTRAVENOUS | Status: DC | PRN
  Administered 2019-03-14: 200 mg via INTRAVENOUS

## 2019-03-14 MED ORDER — SODIUM CHLORIDE 0.9 % IV SOLN: INTRAVENOUS | Status: DC

## 2019-03-14 MED ORDER — HYDROMORPHONE HCL 1 MG/ML IJ SOLN: 0.5000 mg | INTRAMUSCULAR | Status: DC | PRN

## 2019-03-14 MED ORDER — MOMETASONE FURO-FORMOTEROL FUM 100-5 MCG/ACT IN AERO
2.0000 | INHALATION_SPRAY | Freq: Two times a day (BID) | RESPIRATORY_TRACT | Status: DC
  Administered 2019-03-14 – 2019-03-15 (×2): 2 via RESPIRATORY_TRACT
  Filled 2019-03-14: qty 8.8

## 2019-03-14 MED ORDER — ACETAMINOPHEN 325 MG PO TABS: 325.0000 mg | ORAL_TABLET | ORAL | Status: DC | PRN

## 2019-03-14 MED ORDER — NICOTINE 21 MG/24HR TD PT24
21.0000 mg | MEDICATED_PATCH | Freq: Every day | TRANSDERMAL | Status: DC
  Filled 2019-03-14 (×2): qty 1

## 2019-03-14 MED ORDER — ONDANSETRON HCL 4 MG/2ML IJ SOLN
INTRAMUSCULAR | Status: DC | PRN
  Administered 2019-03-14: 4 mg via INTRAVENOUS

## 2019-03-14 MED ORDER — OXYCODONE HCL 5 MG PO TABS: 5.0000 mg | ORAL_TABLET | Freq: Once | ORAL | Status: DC | PRN

## 2019-03-14 MED ORDER — ACETAMINOPHEN 325 MG PO TABS
325.0000 mg | ORAL_TABLET | Freq: Four times a day (QID) | ORAL | Status: DC | PRN
  Filled 2019-03-14: qty 2

## 2019-03-14 MED ORDER — METHOCARBAMOL 750 MG PO TABS
750.0000 mg | ORAL_TABLET | Freq: Four times a day (QID) | ORAL | Status: DC | PRN
  Administered 2019-03-14 – 2019-03-15 (×3): 750 mg via ORAL
  Filled 2019-03-14 (×3): qty 1

## 2019-03-14 MED ORDER — TOBRAMYCIN-DEXAMETHASONE 0.3-0.1 % OP OINT
1.0000 "application " | TOPICAL_OINTMENT | Freq: Two times a day (BID) | OPHTHALMIC | Status: DC
  Administered 2019-03-14 – 2019-03-15 (×2): 1 via OPHTHALMIC
  Filled 2019-03-14: qty 3.5

## 2019-03-14 MED ORDER — FENTANYL CITRATE (PF) 250 MCG/5ML IJ SOLN
INTRAMUSCULAR | Status: DC | PRN
  Administered 2019-03-14 (×5): 50 ug via INTRAVENOUS

## 2019-03-14 MED ORDER — MIDAZOLAM HCL 5 MG/5ML IJ SOLN
INTRAMUSCULAR | Status: DC | PRN
  Administered 2019-03-14: 2 mg via INTRAVENOUS

## 2019-03-14 MED ORDER — CEFAZOLIN SODIUM-DEXTROSE 2-4 GM/100ML-% IV SOLN
2.0000 g | INTRAVENOUS | Status: AC
  Administered 2019-03-14: 2 g via INTRAVENOUS

## 2019-03-14 MED ORDER — UMECLIDINIUM BROMIDE 62.5 MCG/INH IN AEPB
1.0000 | INHALATION_SPRAY | Freq: Every day | RESPIRATORY_TRACT | Status: DC
  Administered 2019-03-15: 1 via RESPIRATORY_TRACT
  Filled 2019-03-14: qty 7

## 2019-03-14 MED ORDER — ONDANSETRON HCL 4 MG/2ML IJ SOLN
INTRAMUSCULAR | Status: AC
  Filled 2019-03-14: qty 2

## 2019-03-14 MED ORDER — PANTOPRAZOLE SODIUM 40 MG PO TBEC
40.0000 mg | DELAYED_RELEASE_TABLET | Freq: Every day | ORAL | Status: DC
  Administered 2019-03-15: 40 mg via ORAL
  Filled 2019-03-14: qty 1

## 2019-03-14 MED ORDER — ESMOLOL HCL 100 MG/10ML IV SOLN
INTRAVENOUS | Status: DC | PRN
  Administered 2019-03-14: 30 mg via INTRAVENOUS
  Administered 2019-03-14: 20 mg via INTRAVENOUS

## 2019-03-14 MED ORDER — CEFAZOLIN SODIUM-DEXTROSE 1-4 GM/50ML-% IV SOLN
1.0000 g | Freq: Four times a day (QID) | INTRAVENOUS | Status: AC
  Administered 2019-03-14 (×2): 1 g via INTRAVENOUS
  Filled 2019-03-14 (×2): qty 50

## 2019-03-14 MED ORDER — VALSARTAN-HYDROCHLOROTHIAZIDE 160-12.5 MG PO TABS: 1.0000 | ORAL_TABLET | Freq: Every day | ORAL | Status: DC

## 2019-03-14 MED ORDER — PHENOL 1.4 % MT LIQD: 1.0000 | OROMUCOSAL | Status: DC | PRN

## 2019-03-14 MED ORDER — FENTANYL CITRATE (PF) 100 MCG/2ML IJ SOLN
INTRAMUSCULAR | Status: AC
  Filled 2019-03-14: qty 2

## 2019-03-14 MED ORDER — LIDOCAINE 2% (20 MG/ML) 5 ML SYRINGE
INTRAMUSCULAR | Status: AC
  Filled 2019-03-14: qty 5

## 2019-03-14 MED ORDER — POTASSIUM CHLORIDE CRYS ER 10 MEQ PO TBCR
20.0000 meq | EXTENDED_RELEASE_TABLET | Freq: Every day | ORAL | Status: DC
  Administered 2019-03-14 – 2019-03-15 (×2): 20 meq via ORAL
  Filled 2019-03-14 (×2): qty 2

## 2019-03-14 MED ORDER — CEFAZOLIN SODIUM-DEXTROSE 2-4 GM/100ML-% IV SOLN
INTRAVENOUS | Status: AC
  Filled 2019-03-14: qty 100

## 2019-03-14 MED ORDER — LIDOCAINE 2% (20 MG/ML) 5 ML SYRINGE
INTRAMUSCULAR | Status: DC | PRN
  Administered 2019-03-14: 80 mg via INTRAVENOUS

## 2019-03-14 MED ORDER — DEXAMETHASONE SODIUM PHOSPHATE 10 MG/ML IJ SOLN
INTRAMUSCULAR | Status: AC
  Filled 2019-03-14: qty 1

## 2019-03-14 MED ORDER — DULOXETINE HCL 30 MG PO CPEP
30.0000 mg | ORAL_CAPSULE | Freq: Every day | ORAL | Status: DC
  Administered 2019-03-14 – 2019-03-15 (×2): 30 mg via ORAL
  Filled 2019-03-14 (×2): qty 1

## 2019-03-14 MED ORDER — MIDAZOLAM HCL 2 MG/2ML IJ SOLN
INTRAMUSCULAR | Status: AC
  Filled 2019-03-14: qty 2

## 2019-03-14 SURGICAL SUPPLY — 54 items
BENZOIN TINCTURE PRP APPL 2/3 (GAUZE/BANDAGES/DRESSINGS) ×2 IMPLANT
BLADE CLIPPER SURG (BLADE) IMPLANT
BLADE SAW SGTL 18X1.27X75 (BLADE) ×2 IMPLANT
COVER SURGICAL LIGHT HANDLE (MISCELLANEOUS) ×2 IMPLANT
COVER WAND RF STERILE (DRAPES) ×2 IMPLANT
CUP SECTOR GRIPTON 50MM (Cup) ×2 IMPLANT
DRAPE C-ARM 42X72 X-RAY (DRAPES) ×2 IMPLANT
DRAPE STERI IOBAN 125X83 (DRAPES) ×2 IMPLANT
DRAPE U-SHAPE 47X51 STRL (DRAPES) ×6 IMPLANT
DRSG AQUACEL AG ADV 3.5X10 (GAUZE/BANDAGES/DRESSINGS) ×2 IMPLANT
DRSG XEROFORM 1X8 (GAUZE/BANDAGES/DRESSINGS) ×2 IMPLANT
DURAPREP 26ML APPLICATOR (WOUND CARE) ×2 IMPLANT
ELECT BLADE 4.0 EZ CLEAN MEGAD (MISCELLANEOUS) ×2
ELECT BLADE 6.5 EXT (BLADE) ×2 IMPLANT
ELECT REM PT RETURN 9FT ADLT (ELECTROSURGICAL) ×2
ELECTRODE BLDE 4.0 EZ CLN MEGD (MISCELLANEOUS) ×1 IMPLANT
ELECTRODE REM PT RTRN 9FT ADLT (ELECTROSURGICAL) ×1 IMPLANT
FACESHIELD WRAPAROUND (MASK) ×6 IMPLANT
GLOVE BIOGEL PI IND STRL 8 (GLOVE) ×2 IMPLANT
GLOVE BIOGEL PI INDICATOR 8 (GLOVE) ×2
GLOVE ECLIPSE 8.0 STRL XLNG CF (GLOVE) ×2 IMPLANT
GLOVE ORTHO TXT STRL SZ7.5 (GLOVE) ×4 IMPLANT
GOWN STRL REUS W/ TWL LRG LVL3 (GOWN DISPOSABLE) ×2 IMPLANT
GOWN STRL REUS W/ TWL XL LVL3 (GOWN DISPOSABLE) ×2 IMPLANT
GOWN STRL REUS W/TWL LRG LVL3 (GOWN DISPOSABLE) ×2
GOWN STRL REUS W/TWL XL LVL3 (GOWN DISPOSABLE) ×2
HANDPIECE INTERPULSE COAX TIP (DISPOSABLE) ×1
HEAD FEM STD 32X+1 STRL (Hips) ×2 IMPLANT
KIT BASIN OR (CUSTOM PROCEDURE TRAY) ×2 IMPLANT
KIT TURNOVER KIT B (KITS) ×2 IMPLANT
LINER ACETABULAR 32X50 (Liner) ×2 IMPLANT
MANIFOLD NEPTUNE II (INSTRUMENTS) ×2 IMPLANT
NS IRRIG 1000ML POUR BTL (IV SOLUTION) ×2 IMPLANT
PACK TOTAL JOINT (CUSTOM PROCEDURE TRAY) ×2 IMPLANT
PAD ARMBOARD 7.5X6 YLW CONV (MISCELLANEOUS) ×2 IMPLANT
SET HNDPC FAN SPRY TIP SCT (DISPOSABLE) ×1 IMPLANT
SPONGE LAP 18X18 RF (DISPOSABLE) ×2 IMPLANT
STAPLER VISISTAT 35W (STAPLE) ×2 IMPLANT
STEM FEM SZ3 STD ACTIS (Stem) ×2 IMPLANT
STRIP CLOSURE SKIN 1/2X4 (GAUZE/BANDAGES/DRESSINGS) ×4 IMPLANT
SUT ETHIBOND NAB CT1 #1 30IN (SUTURE) ×2 IMPLANT
SUT MNCRL AB 4-0 PS2 18 (SUTURE) IMPLANT
SUT VIC AB 0 CT1 27 (SUTURE) ×1
SUT VIC AB 0 CT1 27XBRD ANBCTR (SUTURE) ×1 IMPLANT
SUT VIC AB 1 CT1 27 (SUTURE) ×1
SUT VIC AB 1 CT1 27XBRD ANBCTR (SUTURE) ×1 IMPLANT
SUT VIC AB 2-0 CT1 27 (SUTURE) ×2
SUT VIC AB 2-0 CT1 TAPERPNT 27 (SUTURE) ×2 IMPLANT
TOWEL GREEN STERILE (TOWEL DISPOSABLE) ×2 IMPLANT
TOWEL GREEN STERILE FF (TOWEL DISPOSABLE) ×2 IMPLANT
TRAY CATH 16FR W/PLASTIC CATH (SET/KITS/TRAYS/PACK) IMPLANT
TRAY FOLEY W/BAG SLVR 16FR (SET/KITS/TRAYS/PACK)
TRAY FOLEY W/BAG SLVR 16FR ST (SET/KITS/TRAYS/PACK) IMPLANT
WATER STERILE IRR 1000ML POUR (IV SOLUTION) ×4 IMPLANT

## 2019-03-14 NOTE — Anesthesia Preprocedure Evaluation (Addendum)
Anesthesia Evaluation  Patient identified by MRN, date of birth, ID band Patient awake    Reviewed: Allergy & Precautions, NPO status , Patient's Chart, lab work & pertinent test results  History of Anesthesia Complications Negative for: history of anesthetic complications  Airway Mallampati: II  TM Distance: >3 FB Neck ROM: Full    Dental  (+) Edentulous Upper, Poor Dentition, Missing, Loose,    Pulmonary shortness of breath and with exertion, asthma , COPD,  COPD inhaler, Current Smoker and Patient abstained from smoking.,  Last PFTs seen are from 2013. ("PFTs 05/25/2011 FEV1 2.75 (103%) ratio 73 and DLC0 58 corrects to 67% c/w GOLD 0")  CXR 01/19/19: FINDINGS: The heart size and mediastinal contours are within normal limits. Both lungs are clear. The visualized skeletal structures are unremarkable. IMPRESSION: Normal exam.    Pulmonary exam normal        Cardiovascular Exercise Tolerance: Poor hypertension, Pt. on medications Normal cardiovascular exam  EKG: 10/14/18: Normal sinus rhythm Possible Left atrial enlargement Nonspecific T wave abnormality Abnormal ECG Since previous tracing QT has shortened  - Remote history of stress test. Nuclear stress test 06/21/06: IMPRESSION:  Normal examination without evidence of pharmacologically induced myocardial ischemia. The calculated left ventricular ejection fraction is 57%.   Neuro/Psych  Headaches, PSYCHIATRIC DISORDERS Anxiety Depression  Neuromuscular disease    GI/Hepatic Neg liver ROS, GERD  Medicated and Controlled,  Endo/Other  diabetes, Type 2  Renal/GU negative Renal ROS  negative genitourinary   Musculoskeletal  (+) Arthritis , Osteoarthritis,    Abdominal   Peds  Hematology  (+) Blood dyscrasia, anemia ,   Anesthesia Other Findings On Plavix instead of aspirin for primary prevention d/t aspirin intolerance. No known h/o MI/CVA/PVD/blood clot  Reproductive/Obstetrics                            Anesthesia Physical  Anesthesia Plan  ASA: III  Anesthesia Plan: General   Post-op Pain Management:    Induction: Intravenous  PONV Risk Score and Plan: 2 and Ondansetron, Dexamethasone, Midazolam and Treatment may vary due to age or medical condition  Airway Management Planned: Oral ETT and LMA  Additional Equipment: None  Intra-op Plan:   Post-operative Plan: Extubation in OR  Informed Consent: I have reviewed the patients History and Physical, chart, labs and discussed the procedure including the risks, benefits and alternatives for the proposed anesthesia with the patient or authorized representative who has indicated his/her understanding and acceptance.     Dental advisory given  Plan Discussed with: Anesthesiologist  Anesthesia Plan Comments:         Anesthesia Quick Evaluation

## 2019-03-14 NOTE — Evaluation (Signed)
Physical Therapy Evaluation Patient Details Name: Stacy [name] [facility] MRN: [id number] DOB: [birth year] Today's Date: 03/14/2019   History of Present Illness  Pt is 59 yo female s/p L hip anterior THA on 03/14/19.  She has PMH significant of COPD, DM2 , R TKA, gout, OA, ashtma, GERD, and back surgeries.  Clinical Impression  Pt is s/p L anterior THA resulting in the deficits listed below (see PT Problem List).  Pt lethargic but wanting to get OOB.  She required min-mod A for transfers with use of trapeze bar.  Pt ambulated 30' with RW slowly and with cues. Pt has multiple co-morbidities, lower PLOF, spouse unable to assist, and multilevel home.   Pt will benefit from skilled PT to increase their independence and safety with mobility to allow discharge to the venue listed below.      Follow Up Recommendations SNF(noted per chart plan for SNF due to decreased care giver support at home and multiple comorbidities)    Equipment Recommendations  Rolling walker with 5" wheels    Recommendations for Other Services       Precautions / Restrictions Precautions Precautions: None;Fall Restrictions Weight Bearing Restrictions: Yes LLE Weight Bearing: Weight bearing as tolerated      Mobility  Bed Mobility Overal bed mobility: Needs Assistance Bed Mobility: Supine to Sit     Supine to sit: Mod assist     General bed mobility comments: mod A supine to sit : cues for technique, assist with L leg and to boost trunk, use of trapeze  Transfers Overall transfer level: Needs assistance Equipment used: Rolling walker (2 wheeled) Transfers: Sit to/from Stand Sit to Stand: Min assist         General transfer comment: cues for safe hand placement; needed increased time and bed elevated (placed pillows in chair to elevate)  Ambulation/Gait Ambulation/Gait assistance: Min assist Gait Distance (Feet): 30 Feet Assistive device: Rolling walker (2 wheeled) Gait Pattern/deviations: Step-to  pattern;Decreased stride length Gait velocity: decreased Gait velocity interpretation: <1.31 ft/sec, indicative of household ambulator General Gait Details: assist with RW in room, cued for sequence, cued for RW proximity  Stairs            Wheelchair Mobility    Modified Rankin (Stroke Patients Only)       Balance Overall balance assessment: Needs assistance Sitting-balance support: Bilateral upper extremity supported;Feet supported Sitting balance-Leahy Scale: Fair     Standing balance support: Bilateral upper extremity supported;During functional activity Standing balance-Leahy Scale: Poor                               Pertinent Vitals/Pain Pain Assessment: 0-10 Pain Score: 4  Pain Location: L hip and back (back felt better when OOB) Pain Descriptors / Indicators: Sore;Discomfort Pain Intervention(s): Limited activity within patient's tolerance;Monitored during session;Relaxation;Repositioned    Home Living Family/patient expects to be discharged to:: Private residence Living Arrangements: Spouse/significant other Available Help at Discharge: Family;Available PRN/intermittently Type of Home: House Home Access: Stairs to enter Entrance Stairs-Rails: None Entrance Stairs-Number of Steps: 2 Home Layout: Two level;Able to live on main level with bedroom/bathroom((hospital bed and half bath on first floor, reports on first floor for 5 years)) Home Equipment: Walker - 4 wheels;Cane - single point;Hospital bed;Bedside commode      Prior Function Level of Independence: Needs assistance   Gait / Transfers Assistance Needed: Pt complete short community ambulation, uses Rollator  ADL's / Homemaking Assistance Needed: Reports  could do ADLs most of the time; states some difficutly; did sponge baths as she is unable to go upstairs to full bath  Comments: Reports spouse unable to physically assist     Hand Dominance        Extremity/Trunk Assessment    Upper Extremity Assessment Upper Extremity Assessment: Overall WFL for tasks assessed    Lower Extremity Assessment Lower Extremity Assessment: LLE deficits/detail;RLE deficits/detail RLE Deficits / Details: Grossly demonstrating ROM WFL and at least 3/5 strength LLE Deficits / Details: Grossly demonstrating ROM WFL and knee and ankle 3/5, hip 1/5    Cervical / Trunk Assessment Cervical / Trunk Assessment: Normal  Communication   Communication: No difficulties  Cognition Arousal/Alertness: Lethargic Behavior During Therapy: WFL for tasks assessed/performed Overall Cognitive Status: Within Functional Limits for tasks assessed                                 General Comments: Lethargic but wanting OOB due to back soreness      General Comments General comments (skin integrity, edema, etc.): vss    Exercises Total Joint Exercises Ankle Circles/Pumps: AROM;Both;10 reps   Assessment/Plan    PT Assessment Patient needs continued PT services  PT Problem List Decreased strength;Decreased mobility;Decreased safety awareness;Decreased coordination;Decreased activity tolerance;Decreased cognition;Decreased balance;Decreased knowledge of use of DME       PT Treatment Interventions DME instruction;Therapeutic activities;Modalities;Gait training;Therapeutic exercise;Patient/family education;Stair training;Balance training;Functional mobility training    PT Goals (Current goals can be found in the Care Plan section)  Acute Rehab PT Goals Patient Stated Goal: SNF at d/c so that she has assistance PT Goal Formulation: With patient Time For Goal Achievement: 03/28/19 Potential to Achieve Goals: Good    Frequency 7X/week   Barriers to discharge Decreased caregiver support;Inaccessible home environment      Co-evaluation               AM-PAC PT "6 Clicks" Mobility  Outcome Measure Help needed turning from your back to your side while in a flat bed without using  bedrails?: A Little Help needed moving from lying on your back to sitting on the side of a flat bed without using bedrails?: A Lot Help needed moving to and from a bed to a chair (including a wheelchair)?: A Little Help needed standing up from a chair using your arms (e.g., wheelchair or bedside chair)?: A Little Help needed to walk in hospital room?: A Little Help needed climbing 3-5 steps with a railing? : A Lot 6 Click Score: 16    End of Session Equipment Utilized During Treatment: Gait belt Activity Tolerance: Patient tolerated treatment well Patient left: in chair;with call bell/phone within reach(nursing reports no chair alarm needed/available.) Nurse Communication: Mobility status;Weight bearing status PT Visit Diagnosis: Other abnormalities of gait and mobility (R26.89);Muscle weakness (generalized) (M62.81)    Time: 2683-4196 PT Time Calculation (min) (ACUTE ONLY): 30 min   Charges:   PT Evaluation $PT Eval Moderate Complexity: 1 Mod          Maggie Font, PT Acute Rehab Services Pager [phone] [facility] [phone] [facility] [place] 03/14/2019, 4:23 PM

## 2019-03-14 NOTE — H&P (Signed)
TOTAL HIP ADMISSION H&P  Patient is admitted for left total hip arthroplasty.  Subjective:  Chief Complaint: left hip pain  HPI: Stacy [name], 59 y.o. female, has a history of pain and functional disability in the left hip(s) due to arthritis and patient has failed non-surgical conservative treatments for greater than 12 weeks to include NSAID's and/or analgesics, corticosteriod injections, flexibility and strengthening excercises, use of assistive devices and activity modification.  Onset of symptoms was gradual starting 3 years ago with gradually worsening course since that time.The patient noted no past surgery on the left hip(s).  Patient currently rates pain in the left hip at 10 out of 10 with activity. Patient has night pain, worsening of pain with activity and weight bearing, pain that interfers with activities of daily living and pain with passive range of motion. Patient has evidence of subchondral sclerosis, periarticular osteophytes and joint space narrowing by imaging studies. This condition presents safety issues increasing the risk of falls.  There is no current active infection.  Patient Active Problem List   Diagnosis Date Noted  . Post laminectomy syndrome 02/23/2019  . Abnormality of gait 02/23/2019  . 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 Unilateral primary osteoarthritis, left hip 12/28/2018  . Lesion of skin of left ear 12/26/2018  . Primary osteoarthritis of left hip 12/02/2018  . Iron deficiency anemia 10/16/2018  . Chronic pain syndrome 09/08/2018  . Chronic pain of right knee 08/11/2018  . Status post lumbar laminectomy 07/15/2018  . Moderate persistent asthma without complication 06/29/2017  . Environmental and seasonal allergies 06/29/2017  . Controlled type 2 diabetes mellitus with diabetic polyneuropathy, without long-term current use of insulin (HCC) 06/29/2017  . Perennial allergic rhinitis 04/08/2017  . Sensorineural hearing loss (SNHL), bilateral 04/08/2017   . Chronic pansinusitis 03/25/2017  . Eustachian tube dysfunction, bilateral 03/25/2017  . 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 Herniation of lumbar intervertebral disc with radiculopathy 10/02/2016  . Alopecia areata 08/19/2016  . Chondromalacia of both patellae 06/03/2015  . Spinal stenosis, lumbar region, with neurogenic claudication 06/03/2015  . Tobacco use disorder 04/25/2015  . DJD (degenerative joint disease) of knee 01/04/2015  . Hemorrhoid 11/14/2014  . Gout of big toe 07/19/2014  . Essential hypertension 08/14/2013  . Gastroesophageal reflux disease without esophagitis 08/14/2013  . COPD (chronic obstructive pulmonary disease) (HCC) 04/17/2011   Past Medical History:  Diagnosis Date  . Allergy    Shellfish, cleaning products  . Anxiety   . Arthritis   . 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 Asthma   . COPD (chronic obstructive pulmonary disease) (HCC)   . Depression   . Diabetes mellitus    Type II  . GERD (gastroesophageal reflux disease)    Pt on Protonix daily  . Glaucoma   . Gout   . Headache(784.0)    otc meds prn  . Hyperlipidemia   . Hypertension   . Irritable bowel syndrome 11/19/2010  . Neuropathy   . Pneumonia YRS AGO  . Restless legs   . Shortness of breath    07/14/2018- uses  4 times a day    Past Surgical History:  Procedure Laterality Date  . BACK SURGERY    . CHOLECYSTECTOMY    . COLONOSCOPY    . ENDOMETRIAL ABLATION  10/2010  . EYE SURGERY    . HERNIA REPAIR     umbicial hernia  . JOINT REPLACEMENT    . KNEE ARTHROSCOPY Left    06/07/2017 Dr.  Dean of [employer]  . KNEE CLOSED REDUCTION Right 12/06/2015   Procedure: CLOSED MANIPULATION RIGHT KNEE;  Surgeon: Kerrin Champagne, MD;  Location: [facility] OR;  Service: Orthopedics;  Laterality: Right;  . KNEE CLOSED REDUCTION Right 01/17/2016   Procedure: CLOSED MANIPULATION RIGHT KNEE;  Surgeon: Kerrin Champagne, MD;  Location: [facility] OR;  Service: Orthopedics;  Laterality: Right;  .  KNEE JOINT MANIPULATION Right 12/06/2015  . LACRIMAL TUBE INSERTION Bilateral 03/01/2019   Procedure: LACRIMAL TUBE INSERTION;  Surgeon: Aura Camps, MD;  Location: [facility];  Service: Ophthalmology;  Laterality: Bilateral;  . LUMBAR DISC SURGERY  06/03/2015   L 2  L3 L4 L5   . LUMBAR LAMINECTOMY/DECOMPRESSION MICRODISCECTOMY N/A 06/03/2015   Procedure: Bilateral lateral recess decompression L2-3, L3-4, L4-5;  Surgeon: Kerrin Champagne, MD;  Location: [facility] OR;  Service: Orthopedics;  Laterality: N/A;  . LUMBAR LAMINECTOMY/DECOMPRESSION MICRODISCECTOMY N/A 10/02/2016   Procedure: Right L5-S1 Lateral Recess Decompression  microdiscectomy;  Surgeon: Kerrin Champagne, MD;  Location: [facility] OR;  Service: Orthopedics;  Laterality: N/A;  . LUMBAR LAMINECTOMY/DECOMPRESSION MICRODISCECTOMY N/A 07/15/2018   Procedure: LEFT L3-4 MICRODISCECTOMY;  Surgeon: Kerrin Champagne, MD;  Location: [facility] OR;  Service: Orthopedics;  Laterality: N/A;  . svd      x 2  . TEAR DUCT PROBING Bilateral 03/01/2019   Procedure: TEAR DUCT PROBING WITH IRRIGATION;  Surgeon: Aura Camps, MD;  Location[facility];  Service: Ophthalmology;  Laterality: Bilateral;  . TOTAL KNEE ARTHROPLASTY Right 09/06/2015   Procedure: RIGHT TOTAL KNEE ARTHROPLASTY;  Surgeon: Kerrin Champagne, MD;  Location: [facility] OR;  Service: Orthopedics;  Laterality: Right;  . TUBAL LIGATION    . UPPER GASTROINTESTINAL ENDOSCOPY  04/28/11    Current Facility-Administered Medications  Medication Dose Route Frequency Provider Last Rate Last Admin  . ceFAZolin (ANCEF) 2-4 GM/100ML-% IVPB           . ceFAZolin (ANCEF) IVPB 2g/100 mL premix  2 g Intravenous On Call to OR Kirtland Bouchard, PA-C      . chlorhexidine (HIBICLENS) 4 % liquid 4 application  60 mL Topical Once Richardean Canal W, PA-C      . povidone-iodine 10 % swab 2 application  2 application Topical Once Richardean Canal W, PA-C      . tranexamic acid (CYKLOKAPRON) 1000MG /133mL IVPB            . tranexamic acid (CYKLOKAPRON) IVPB 1,000 mg  1,000 mg Intravenous To OR 80m, PA-C       Allergies  Allergen Reactions  . Other Shortness Of Breath    UNSPECIFIED AGENTS Allergic to perfumes and cleaning products  . Shellfish Allergy Anaphylaxis    Per allergy test.  . Ace Inhibitors Cough and Other (See Comments)       . Aspirin Nausea Only    Social History   Tobacco Use  . Smoking status: Current Some Day Smoker    Packs/day: 0.25    Years: 32.00    Pack years: 8.00    Types: Cigarettes  . Smokeless tobacco: Never Used  . Tobacco comment: DOWN TO 3 PER DAY  Substance Use Topics  . Alcohol use: No    Family History  Problem Relation Age of Onset  . Hypertension Father   . Cancer Father   . Heart disease Mother   . Asthma Son        had as a child  . Heart disease Sister   .  Breast cancer Sister   . Hypertension Brother      Review of Systems  Objective:  Physical Exam  Constitutional: She is oriented to person, place, and time. She appears well-developed and well-nourished.  HENT:  Head: Normocephalic and atraumatic.  Eyes: Pupils are equal, round, and reactive to light. EOM are normal.  Cardiovascular: Normal rate.  Respiratory: Effort normal. She has wheezes.  GI: Soft. Bowel sounds are normal.  Musculoskeletal:     Cervical back: Normal range of motion and neck supple.     Left hip: Tenderness and bony tenderness present. Decreased range of motion. Decreased strength.  Neurological: She is alert and oriented to person, place, and time.  Skin: Skin is warm and dry.  Psychiatric: She has a normal mood and affect.    Vital signs in last 24 hours: Temp:  [98.1 F (36.7 C)] 98.1 F (36.7 C) (02/09 1005) Pulse Rate:  [87] 87 (02/09 1005) Resp:  [18] 18 (02/09 1005) BP: (148)/(88) 148/88 (02/09 1005) SpO2:  [99 %] 99 % (02/09 1005) Weight:  [78.5 kg] 78.5 kg (02/09 1005)  Labs:   Estimated body mass index is 27.92 kg/m as  calculated from the following:   Height as of this encounter: 5\' 6"  (1.676 m).   Weight as of this encounter: 78.5 kg.   Imaging Review Plain radiographs demonstrate moderate degenerative joint disease of the left hip(s). The bone quality appears to be good for age and reported activity level.      Assessment/Plan:  End stage arthritis, left hip(s)  The patient history, physical examination, clinical judgement of the provider and imaging studies are consistent with end stage degenerative joint disease of the left hip(s) and total hip arthroplasty is deemed medically necessary. The treatment options including medical management, injection therapy, arthroscopy and arthroplasty were discussed at length. The risks and benefits of total hip arthroplasty were presented and reviewed. The risks due to aseptic loosening, infection, stiffness, dislocation/subluxation,  thromboembolic complications and other imponderables were discussed.  The patient acknowledged the explanation, agreed to proceed with the plan and consent was signed. Patient is being admitted for inpatient treatment for surgery, pain control, PT, OT, prophylactic antibiotics, VTE prophylaxis, progressive ambulation and ADL's and discharge planning.The patient is planning to be discharged to skilled nursing facility   Anticipated LOS equal to or greater than 2 midnights due to - Age 34 and older with one or more of the following:  - Obesity  - Expected need for hospital services (PT, OT, Nursing) required for safe  discharge  - Anticipated need for postoperative skilled nursing care or inpatient rehab  - Active co-morbidities: None OR   - Unanticipated findings during/Post Surgery: Slow post-op progression: GI, pain control, mobility  - Patient is a high risk of re-admission due to: None

## 2019-03-14 NOTE — Anesthesia Procedure Notes (Signed)
Procedure Name: Intubation Performed by: Berlin Mokry H, CRNA Pre-anesthesia Checklist: Patient identified, Emergency Drugs available, Suction available and Patient being monitored Patient Re-evaluated:Patient Re-evaluated prior to induction Oxygen Delivery Method: Circle System Utilized Preoxygenation: Pre-oxygenation with 100% oxygen Induction Type: IV induction Ventilation: Mask ventilation without difficulty Laryngoscope Size: Mac and 3 Grade View: Grade I Tube type: Oral Tube size: 7.0 mm Number of attempts: 1 Airway Equipment and Method: Stylet and Oral airway Placement Confirmation: ETT inserted through vocal cords under direct vision,  positive ETCO2 and breath sounds checked- equal and bilateral Secured at: 22 cm Tube secured with: Tape Dental Injury: Teeth and Oropharynx as per pre-operative assessment        

## 2019-03-14 NOTE — Op Note (Signed)
Stacy [name], Stacy [name] MEDICAL RECORD [id number] ACCOUNT [id number] DATE OF BIRTH:1960/10/19 FACILITY: [facility] LOCATION: [facility] PHYSICIAN:Karsen Fellows Aretha Parrot, MD  OPERATIVE REPORT  DATE OF PROCEDURE:  03/14/2019  PREOPERATIVE DIAGNOSIS:  Primary osteoarthritis and degenerative joint disease, left hip.  POSTOPERATIVE DIAGNOSIS:  Primary osteoarthritis and degenerative joint disease, left hip.  PROCEDURE:  Left total hip arthroplasty through direct anterior approach.  IMPLANTS:  DePuy Sector Gription acetabular component size 50, size 32+0 neutral polyethylene liner, size 3 Actis femoral component with standard offset, size 32+1 metal hip ball.  SURGEON:  Vanita Panda. Magnus Ivan, MD  ASSISTANT:  Richardean Canal, PA-C  ANESTHESIA:  General.  ANTIBIOTICS:  Two g IV Ancef.  ESTIMATED BLOOD LOSS:  300 mL.  COMPLICATIONS:  None.  INDICATIONS:  The patient is a 59 year old female with COPD who has also developed debilitating left hip pain.  Her plain films showed only some mild joint space narrowing, so we did obtain an MRI of her left hip after a successful intra-articular hip  injection did ease her pain for a little bit.  The MRI showed significant arthritic findings with some areas of partial thickness cartilage loss around the hip and some degenerative tearing of the labrum.  After continued pain and the detrimental effect  this has had on her activities of daily living, her quality of life and her mobility, she did wish to proceed with a total hip arthroplasty at this point.  We talked about the risk of acute blood loss anemia, nerve or vessel injury, fracture, infection,  dislocation, DVT and implant failure.  We talked about our goals being decreased pain, improved mobility and overall improved quality of life.  DESCRIPTION OF PROCEDURE:  After informed consent was obtained and appropriate left hip was marked, she was brought to the operating room.  General anesthesia was  obtained while she was on a stretcher.  Traction boots were placed on both her feet.  Next,  she was placed supine on the Hana fracture table, the perineal post in place and both legs in line skeletal traction device and no traction applied.  Her left operative hip was prepped and draped with DuraPrep and sterile drapes.  A time-out was called.   She was identified as correct patient, correct left hip.  I then made an incision just inferior and posterior to the anterior superior iliac spine and carried this obliquely down the leg.  We dissected down to tensor fascia lata muscle.  Tensor fascia  was then divided longitudinally to proceed with direct anterior approach to the hip.  We identified and cauterized circumflex vessels and identified the hip capsule, opened up the hip capsule in an L-type format.  We then placed Cobra retractors around  the medial and lateral femoral neck and made our femoral neck cut with an oscillating saw just proximal to the lesser trochanter and completed this with an osteotome.  We placed a corkscrew guide in the femoral head and removed the femoral head in its  entirety and found cartilage changes in the femoral head.  We then placed Cobra retractors around the medial and lateral femoral neck and made our femoral neck cut with an oscillating saw and completed this with an osteotome.  Next, we placed a bent  Hohmann over the medial acetabular rim and removed remnants of the acetabular labrum and other debris.  We then began reaming under direct visualization with just 2 reamers with a size 44 and then a size 49 reamer.  Both of these were  placed under direct  visualization and direct fluoroscopy.  This was able to allow [place] to obtain our depth of reaming, our inclination and anteversion.  I then placed the real DePuy Sector Gription acetabular component size 50 and we went with a 32+0 neutral polyethylene  liner.  Attention was then turned to the femur.  With the leg externally  rotated to 120 degrees, extended and adducted, we are to place a Mueller retractor medially and a Hohmann retractor behind the greater trochanter.  We released the lateral joint  capsule and used a box-cutting osteotome to enter the femoral canal and a rongeur to lateralize.  We then began broaching using the Actis broaching system from a size zero going up to a size 3.  With a size 3 in place, we trialed a standard offset  femoral neck and a 32+1 hip ball.  We brought the leg back over and up and with traction and internal rotation, reducing the pelvis.  Assessing this radiographically, we were pleased with our offset and leg length.  Mechanically, I was pleased with the  range of motion and stability.  We then dislocated the hip and removed the trial components.  We then placed the real Actis femoral component size 3 with standard offset and the real 32+1 metal hip ball.  Again we reduced this in the acetabulum and we  were pleased with stability, leg length, offset, range of motion and assessed mechanically and radiographically.  We then irrigated the soft tissue with normal saline solution using pulsatile lavage.  We closed the joint capsule with interrupted #1  Ethibond suture.  A #1 Vicryl was used to close tensor fascia.  Zero Vicryl was used to close the deep tissue.  2-0 Vicryl was used to close the subcutaneous  tissue.  The skin was reapproximated with staples.  Xeroform and Aquacel dressing was applied.   She was taken off the Hana table, awakened, extubated, and taken to the recovery room in stable condition.  All final counts were correct.  There were no complications noted.  Of note, Benita Stabile, PA-C did assist during the entire case.  His assistance  was crucial for facilitating all aspects of this case.  VN/NUANCE  D:03/14/2019 T:03/14/2019 JOB:[id number]

## 2019-03-14 NOTE — Transfer of Care (Signed)
Immediate Anesthesia Transfer of Care Note  Patient: Stacy [name]  Procedure(s) Performed: LEFT TOTAL HIP ARTHROPLASTY ANTERIOR APPROACH (Left Hip)  Patient Location: PACU  Anesthesia Type:General  Level of Consciousness: awake  Airway & Oxygen Therapy: Patient Spontanous Breathing and Patient connected to face mask oxygen  Post-op Assessment: Report given to RN and Post -op Vital signs reviewed and stable  Post vital signs: Reviewed  Last Vitals:  Vitals Value Taken Time  BP 153/103 03/14/19 1337  Temp    Pulse 84 03/14/19 1339  Resp 10 03/14/19 1339  SpO2 99 % 03/14/19 1339  Vitals shown include unvalidated device data.  Last Pain:  Vitals:   03/14/19 1033  TempSrc:   PainSc: 5       Patients Stated Pain Goal: 2 (03/14/19 1033)  Complications: No apparent anesthesia complications

## 2019-03-14 NOTE — Brief Op Note (Signed)
03/14/2019  1:13 PM  PATIENT:  Stacy [name]  59 y.o. female  PRE-OPERATIVE DIAGNOSIS:  osteoarthritis left hip  POST-OPERATIVE DIAGNOSIS:  osteoarthritis left hip  PROCEDURE:  Procedure(s): LEFT TOTAL HIP ARTHROPLASTY ANTERIOR APPROACH (Left)  SURGEON:  Surgeon(s) and Role:    [name], MD - Primary  PHYSICIAN ASSISTANT:  Rexene Edison, PA-C  ANESTHESIA:   general  EBL:  300 mL    COUNTS:  YES  TOURNIQUET:  * No tourniquets in log *  DICTATION: .Other Dictation: Dictation Number [id number]  PLAN OF CARE: Admit to inpatient   PATIENT DISPOSITION:  PACU - hemodynamically stable.   Delay start of Pharmacological VTE agent (>24hrs) due to surgical blood loss or risk of bleeding: no

## 2019-03-15 ENCOUNTER — Encounter: Payer: Self-pay | Admitting: Internal Medicine

## 2019-03-15 DIAGNOSIS — M1612 Unilateral primary osteoarthritis, left hip: Secondary | ICD-10-CM | POA: Diagnosis not present

## 2019-03-15 LAB — CBC
HCT: 31.4 % — ABNORMAL LOW (ref 36.0–46.0)
Hemoglobin: 10.7 g/dL — ABNORMAL LOW (ref 12.0–15.0)
MCH: 28 pg (ref 26.0–34.0)
MCHC: 34.1 g/dL (ref 30.0–36.0)
MCV: 82.2 fL (ref 80.0–100.0)
Platelets: 277 10*3/uL (ref 150–400)
RBC: 3.82 MIL/uL — ABNORMAL LOW (ref 3.87–5.11)
RDW: 13.7 % (ref 11.5–15.5)
WBC: 12.5 10*3/uL — ABNORMAL HIGH (ref 4.0–10.5)
nRBC: 0 % (ref 0.0–0.2)

## 2019-03-15 LAB — GLUCOSE, CAPILLARY
Glucose-Capillary: 160 mg/dL — ABNORMAL HIGH (ref 70–99)
Glucose-Capillary: 186 mg/dL — ABNORMAL HIGH (ref 70–99)

## 2019-03-15 LAB — BASIC METABOLIC PANEL
Anion gap: 13 (ref 5–15)
BUN: 9 mg/dL (ref 6–20)
CO2: 25 mmol/L (ref 22–32)
Calcium: 9.1 mg/dL (ref 8.9–10.3)
Chloride: 94 mmol/L — ABNORMAL LOW (ref 98–111)
Creatinine, Ser: 0.85 mg/dL (ref 0.44–1.00)
GFR calc Af Amer: >60 mL/min (ref >60)
GFR calc non Af Amer: >60 mL/min (ref >60)
Glucose, Bld: 140 mg/dL — ABNORMAL HIGH (ref 70–99)
Potassium: 3.6 mmol/L (ref 3.5–5.1)
Sodium: 132 mmol/L — ABNORMAL LOW (ref 135–145)

## 2019-03-15 MED ORDER — OXYCODONE HCL 5 MG PO TABS: 5.0000 mg | ORAL_TABLET | ORAL | 0 refills | Status: DC | PRN

## 2019-03-15 MED ORDER — ASPIRIN 81 MG PO CHEW: 81.0000 mg | CHEWABLE_TABLET | Freq: Every day | ORAL | 0 refills | Status: DC

## 2019-03-15 NOTE — Discharge Instructions (Signed)

## 2019-03-15 NOTE — Discharge Summary (Signed)
Patient ID: Stacy [name] MRN: [id number] DOB/AGE: 1960-04-06 59 y.o.  Admit date: 03/14/2019 Discharge date: 03/15/2019  Admission Diagnoses:  Principal Problem:   Unilateral primary osteoarthritis, left hip Active Problems:   Status post total replacement of left hip   Discharge Diagnoses:  Same  Past Medical History:  Diagnosis Date  . Allergy    Shellfish, cleaning products  . Anxiety   . Arthritis   . 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 Asthma   . COPD (chronic obstructive pulmonary disease) ([place])   . Depression   . Diabetes mellitus    Type II  . GERD (gastroesophageal reflux disease)    Pt on Protonix daily  . Glaucoma   . Gout   . Headache(784.0)    otc meds prn  . Hyperlipidemia   . Hypertension   . Irritable bowel syndrome 11/19/2010  . Neuropathy   . Pneumonia YRS AGO  . Restless legs   . Shortness of breath    07/14/2018- uses  4 times a day    Surgeries: Procedure(s): LEFT TOTAL HIP ARTHROPLASTY ANTERIOR APPROACH on 03/14/2019   Consultants:   Discharged Condition: Improved  Hospital Course: Danahi Reddish Rodenbeck is an 59 y.o. female who was admitted 03/14/2019 for operative treatment ofUnilateral primary osteoarthritis, left hip. Patient has severe unremitting pain that affects sleep, daily activities, and work/hobbies. After pre-op clearance the patient was taken to the operating room on 03/14/2019 and underwent  Procedure(s): LEFT TOTAL HIP ARTHROPLASTY ANTERIOR APPROACH.    Patient was given perioperative antibiotics:  Anti-infectives (From admission, onward)   Start     Dose/Rate Route Frequency Ordered Stop   03/14/19 1515  ceFAZolin (ANCEF) IVPB 1 g/50 mL premix     1 g 100 mL/hr over 30 Minutes Intravenous Every 6 hours 03/14/19 1501 03/14/19 2108   03/14/19 1019  ceFAZolin (ANCEF) 2-4 GM/100ML-% IVPB    Note to Pharmacy: Marga Melnick   : cabinet override      03/14/19 1019 03/14/19 1218   03/14/19 1015   ceFAZolin (ANCEF) IVPB 2g/100 mL premix     2 g 200 mL/hr over 30 Minutes Intravenous On call to O.R. 03/14/19 1014 03/14/19 1210       Patient was given sequential compression devices, early ambulation, and chemoprophylaxis to prevent DVT.  Patient benefited maximally from hospital stay and there were no complications.    Recent vital signs:  Patient Vitals for the past 24 hrs:  BP Temp Temp src Pulse Resp SpO2  03/15/19 1222 (!) 90/59 98.6 F (37 C) Oral 91 18 98 %  03/15/19 0823 -- -- -- -- -- 95 %  03/15/19 0809 97/68 99.1 F (37.3 C) Oral 91 18 98 %  03/15/19 0347 100/68 99.8 F (37.7 C) Oral 91 18 93 %  03/14/19 2318 120/86 100 F (37.8 C) Oral 87 18 96 %  03/14/19 1937 -- -- -- -- -- 96 %  03/14/19 1927 118/80 99.9 F (37.7 C) Oral 85 18 96 %     Recent laboratory studies:  Recent Labs    03/15/19 0607  WBC 12.5*  HGB 10.7*  HCT 31.4*  PLT 277  NA 132*  K 3.6  CL 94*  CO2 25  BUN 9  CREATININE 0.85  GLUCOSE 140*  CALCIUM 9.1     Discharge Medications:   Allergies as of 03/15/2019      Reactions   Other Shortness Of Breath   UNSPECIFIED AGENTS  Allergic to perfumes and cleaning products   Shellfish Allergy Anaphylaxis   Per allergy test.   Ace Inhibitors Cough, Other (See Comments)      Aspirin Nausea Only      Medication List    TAKE these medications   Accu-Chek Aviva Plus test strip Generic drug: glucose blood USE 3 TIMES A DAY AS DIRECTED BY PHYSICIAN   Accu-Chek Aviva Plus w/Device Kit 1 each by Does not apply route 3 (three) times daily.   accu-chek soft touch lancets Use as instructed   Accu-Chek Softclix Lancets lancets USE AS INSTRUCTED 3 TIMES A DAY   albuterol (2.5 MG/3ML) 0.083% nebulizer solution Commonly known as: PROVENTIL Take 3 mLs (2.5 mg total) by nebulization every 6 (six) hours as needed for wheezing.   Allergy Relief 10 MG tablet Generic drug: loratadine TAKE 1 TABLET (10 MG TOTAL) BY MOUTH DAILY.    allopurinol 100 MG tablet Commonly known as: ZYLOPRIM TAKE 1 TABLET (100 MG TOTAL) BY MOUTH DAILY.   aspirin 81 MG chewable tablet Chew 1 tablet (81 mg total) by mouth daily.   budesonide-formoterol 80-4.5 MCG/ACT inhaler Commonly known as: Symbicort INHALE TWO PUFFS BY MOUTH TWICE A DAY What changed:   how much to take  how to take this  when to take this  additional instructions   clopidogrel 75 MG tablet Commonly known as: PLAVIX Take 1 tablet (75 mg total) by mouth daily.   diclofenac sodium 1 % Gel Commonly known as: Voltaren Apply 4 g topically 4 (four) times daily.   DULoxetine 30 MG capsule Commonly known as: Cymbalta Take 1 capsule (30 mg total) by mouth daily.   EPINEPHrine 0.3 mg/0.3 mL Soaj injection Commonly known as: EPI-PEN 0.3 mg IM x 1 PRN for allergic reaction What changed:   how much to take  how to take this  when to take this  reasons to take this  additional instructions   [name] 325 (65 FE) MG tablet Generic drug: ferrous sulfate TAKE 1 TABLET BY MOUTH 2 (TWO) TIMES DAILY WITH A MEAL. What changed: See the new instructions.   fluticasone 50 MCG/ACT nasal spray Commonly known as: FLONASE Place 1 spray into both nostrils daily.   Flutter Devi Use after breathing treatment 4 times daily   gabapentin 600 MG tablet Commonly known as: NEURONTIN TAKE 1 TABLET (600 MG TOTAL) BY MOUTH 3 (THREE) TIMES DAILY.   glimepiride 2 MG tablet Commonly known as: AMARYL Take 1 tablet (2 mg total) by mouth daily.   hydrochlorothiazide 25 MG tablet Commonly known as: HYDRODIURIL TAKE 1 TABLET (25 MG TOTAL) BY MOUTH DAILY.   hydrOXYzine 10 MG tablet Commonly known as: ATARAX/VISTARIL TAKE 1 TABLET IN THE MORNING AND NOON AND 2 TABLETS IN THE EVENING AS NEEDED What changed: See the new instructions.   Melatonin 3 MG Tabs Take 1 tablet (3 mg total) by mouth at bedtime as needed. What changed: reasons to take this   meloxicam 15 MG  tablet Commonly known as: Mobic Take 1 tablet (15 mg total) by mouth daily.   methocarbamol 750 MG tablet Commonly known as: ROBAXIN Take 1 tablet (750 mg total) by mouth every 6 (six) hours as needed for muscle spasms.   montelukast 10 MG tablet Commonly known as: SINGULAIR Take 10 mg by mouth at bedtime.   multivitamin with minerals Tabs tablet Take 1 tablet by mouth daily. CENTRUM SILVER   nicotine 14 mg/24hr patch Commonly known as: NICODERM CQ - dosed in mg/24  hours PLACE 1 PATCH ONTO THE SKIN DAILY   nitrofurantoin (macrocrystal-monohydrate) 100 MG capsule Commonly known as: MACROBID Take 1 capsule (100 mg total) by mouth 2 (two) times daily.   ondansetron 4 MG tablet Commonly known as: Zofran Take 1 tablet (4 mg total) by mouth daily as needed for nausea or vomiting.   oxyCODONE 5 MG immediate release tablet Commonly known as: Oxy IR/ROXICODONE Take 1-2 tablets (5-10 mg total) by mouth every 4 (four) hours as needed for moderate pain (pain score 4-6).   pantoprazole 40 MG tablet Commonly known as: PROTONIX Take 1 tablet (40 mg total) by mouth daily.   Potassium Chloride ER 20 MEQ Tbcr Take 20 mEq by mouth daily.   senna-docusate 8.6-50 MG tablet Commonly known as: Senokot S Take 1 tablet by mouth daily as needed for mild constipation.   simvastatin 20 MG tablet Commonly known as: ZOCOR TAKE ONE TABLET BY MOUTH DAILY AT BEDTIME.   Spiriva HandiHaler 18 MCG inhalation capsule Generic drug: tiotropium Place 1 capsule (18 mcg total) into inhaler and inhale daily.   TobraDex ophthalmic ointment Generic drug: tobramycin-dexamethasone Place 1 application into the left eye 2 (two) times daily at 10 am and 4 pm.   triamcinolone 0.025 % cream Commonly known as: KENALOG Apply 1 application topically 2 (two) times daily.   valsartan-hydrochlorothiazide 160-12.5 MG tablet Commonly known as: DIOVAN-HCT TAKE ONE TABLET BY MOUTH ONCE DAILY            Durable  Medical Equipment  (From admission, onward)         Start     Ordered   03/15/19 0821  For home use only DME Other see comment  Once    Comments: Trapeze, currently has a hospital bed at home  Question:  Length of Need  Answer:  Lifetime   03/15/19 0821   03/14/19 1502  DME 3 n 1  Once     03/14/19 1501   03/14/19 1502  DME Walker rolling  Once    Question Answer Comment  Walker: With 5 Inch Wheels   Patient needs a walker to treat with the following condition Status post total replacement of left hip      03/14/19 1501          Diagnostic Studies: DG Pelvis Portable  Result Date: 03/14/2019 CLINICAL DATA:  Status post left hip replacement EXAM: PORTABLE PELVIS 1 VIEWS COMPARISON:  Films from earlier in the same day. FINDINGS: Left hip replacement is noted. No acute bony or soft tissue abnormality is noted. IMPRESSION: Status post left hip replacement. Electronically Signed   By: Inez Catalina M.D.   On: 03/14/2019 15:39   DG C-Arm 1-60 Min  Result Date: 03/14/2019 CLINICAL DATA:  Left hip replacement EXAM: DG C-ARM 1-60 MIN; OPERATIVE LEFT HIP WITH PELVIS COMPARISON:  None. FLUOROSCOPY TIME:  Fluoroscopy Time:  16 seconds Radiation Exposure Index (if provided by the fluoroscopic device): Not available Number of Acquired Spot Images: 4 FINDINGS: Left hip replacement is noted in satisfactory position. No acute bony or soft tissue abnormality is seen. IMPRESSION: Status post left hip replacement Electronically Signed   By: Inez Catalina M.D.   On: 03/14/2019 13:51   DG HIP OPERATIVE UNILAT W OR W/O PELVIS LEFT  Result Date: 03/14/2019 CLINICAL DATA:  Left hip replacement EXAM: DG C-ARM 1-60 MIN; OPERATIVE LEFT HIP WITH PELVIS COMPARISON:  None. FLUOROSCOPY TIME:  Fluoroscopy Time:  16 seconds Radiation Exposure Index (if provided by the fluoroscopic  device): Not available Number of Acquired Spot Images: 4 FINDINGS: Left hip replacement is noted in satisfactory position. No acute bony or  soft tissue abnormality is seen. IMPRESSION: Status post left hip replacement Electronically Signed   By: Inez Catalina M.D.   On: 03/14/2019 13:51    Disposition: Discharge disposition: 01-Home or Self Care         Follow-up Information    Mcarthur Rossetti, MD. Schedule an appointment as soon as possible for a visit in 2 week(s).   Specialty: Orthopedic Surgery Contact information: [place] [address] [phone]        Care, [facility] Follow up.   Specialty: Home Health Services Contact information: [address] [place] [address] [phone]            Signed: Mcarthur Rossetti 03/15/2019, 3:19 PM

## 2019-03-15 NOTE — Progress Notes (Signed)
Received pathology results from [facility] pathology from skin biopsy done on the scalp in the left ear 02/07/2019.  This revealed findings consistent with lupus erythematosus.

## 2019-03-15 NOTE — Progress Notes (Signed)
Subjective: 1 Day Post-Op Procedure(s) (LRB): LEFT TOTAL HIP ARTHROPLASTY ANTERIOR APPROACH (Left) Patient reports pain as moderate.  Has been up ambulating.  I spoke with her about going home instead of skilled nursing.  Objective: Vital signs in last 24 hours: Temp:  [98.1 F (36.7 C)-100 F (37.8 C)] 99.8 F (37.7 C) (02/10 0347) Pulse Rate:  [85-96] 91 (02/10 0347) Resp:  [7-20] 18 (02/10 0347) BP: (100-157)/(68-113) 100/68 (02/10 0347) SpO2:  [91 %-100 %] 93 % (02/10 0347) Weight:  [78.5 kg] 78.5 kg (02/09 1005)  Intake/Output from previous day: 02/09 0701 - 02/10 0700 In: 800 [I.V.:500; IV Piggyback:200] Out: 300 [Blood:300] Intake/Output this shift: No intake/output data recorded.  No results for input(s): HGB in the last 72 hours. No results for input(s): WBC, RBC, HCT, PLT in the last 72 hours. No results for input(s): NA, K, CL, CO2, BUN, CREATININE, GLUCOSE, CALCIUM in the last 72 hours. No results for input(s): LABPT, INR in the last 72 hours.  Sensation intact distally Intact pulses distally Dorsiflexion/Plantar flexion intact Incision: dressing C/D/I   Assessment/Plan: 1 Day Post-Op Procedure(s) (LRB): LEFT TOTAL HIP ARTHROPLASTY ANTERIOR APPROACH (Left) Up with therapy Discharge home with home health when clears therapy (possibly this afternoon).      Stacy [name] 03/15/2019, 7:29 AM

## 2019-03-15 NOTE — Progress Notes (Signed)
Patient alert and oriented, mae's well, voiding adequate amount of urine, swallowing without difficulty, no c/o pain at time of discharge. Patient discharged home with family. Script and discharged instructions given to patient. Patient and family stated understanding of instructions given. Patient has an appointment with Dr. Blackman   

## 2019-03-15 NOTE — Evaluation (Signed)
Occupational Therapy Evaluation Patient Details Name: Stacy [name] [facility] MRN: [id number] DOB: July 17, 1960 Today's Date: 03/15/2019    History of Present Illness Pt is 59 yo female s/p L hip anterior THA on 03/14/19.  She has PMH significant of COPD, DM2 , R TKA, gout, OA, ashtma, GERD, and back surgeries.   Clinical Impression   Pt PTA: Pt living with family and pt reports mostly independent, taking sink bath and performing most ADL on her own and mobility with rollator. Pt has hospital bed. Pt currently able to perform LB ADL with crossing legs or bending forward; OT addressing sequence to dress LLE first and undress RLE first. Pt performing UB/LB bathing sitting/standing at sink with supervisionA. Cues requires for hand placement for safe transfers. Pt did not require assist for LB ADL. Pt education on pain medicine schedule and HEP for ambulation. Pt does not require continued acute OT skilled services. OT signing off.      Follow Up Recommendations  Home health OT    Equipment Recommendations  None recommended by OT    Recommendations for Other Services       Precautions / Restrictions Precautions Precautions: None;Fall Restrictions Weight Bearing Restrictions: Yes LLE Weight Bearing: Weight bearing as tolerated      Mobility Bed Mobility Overal bed mobility: Needs Assistance Bed Mobility: Supine to Sit;Sit to Supine     Supine to sit: Supervision;HOB elevated Sit to supine: Supervision;HOB elevated   General bed mobility comments: Pt using trapeze for initial supine to sitting EOB and then performed after ADL and a few mins rest in/out of bed with no use of trapeze.  Transfers Overall transfer level: Needs assistance Equipment used: Rolling walker (2 wheeled) Transfers: Sit to/from Stand Sit to Stand: Supervision         General transfer comment: cues for safe hand placement    Balance Overall balance assessment: Needs assistance Sitting-balance support:  Bilateral upper extremity supported;Feet supported Sitting balance-Leahy Scale: Fair     Standing balance support: Single extremity supported;During functional activity Standing balance-Leahy Scale: Fair Standing balance comment: bathing at sink with fair balance                           ADL either performed or assessed with clinical judgement   ADL Overall ADL's : Needs assistance/impaired Eating/Feeding: Modified independent;Sitting   Grooming: Supervision/safety;Standing   Upper Body Bathing: Supervision/ safety;Standing   Lower Body Bathing: Supervison/ safety;Sitting/lateral leans;Sit to/from stand   Upper Body Dressing : Supervision/safety;Standing   Lower Body Dressing: Supervision/safety;Sitting/lateral leans;Sit to/from stand;Cueing for safety Lower Body Dressing Details (indicate cue type and reason): Pt able to bend forward to perform LB ADL tasks with no assist for donning underwear. Pt reports that she usually does not wear socks. Toilet Transfer: Supervision/safety;Ambulation;BSC;RW;Cueing for safety Toilet Transfer Details (indicate cue type and reason): cues to place hands on BSC prior to sitting down Toileting- Clothing Manipulation and Hygiene: Supervision/safety;Sitting/lateral lean;Sit to/from stand;Cueing for safety       Functional mobility during ADLs: Supervision/safety;Rolling walker;Cueing for safety General ADL Comments: Pt able to perform LB ADL with crossing legs or bending forward; OT addressing sequence to dress LLE first and undress RLE first. Pt performing UB/LB bathing sitting/standing at sink with supervisionA. Cues requires for hand placement for safe transfers     Vision Baseline Vision/History: No visual deficits Patient Visual Report: No change from baseline Vision Assessment?: No apparent visual deficits     Perception  Praxis      Pertinent Vitals/Pain Pain Assessment: 0-10 Pain Score: 5  Pain Location: L hip Pain  Descriptors / Indicators: Sore;Discomfort Pain Intervention(s): Monitored during session;Repositioned;Premedicated before session     Hand Dominance Left   Extremity/Trunk Assessment Upper Extremity Assessment Upper Extremity Assessment: Overall WFL for tasks assessed   Lower Extremity Assessment Lower Extremity Assessment: Defer to PT evaluation LLE Deficits / Details: s/p hip sx; appears to be moving well with LB dressing   Cervical / Trunk Assessment Cervical / Trunk Assessment: Normal   Communication Communication Communication: No difficulties   Cognition Arousal/Alertness: Awake/alert Behavior During Therapy: WFL for tasks assessed/performed Overall Cognitive Status: Within Functional Limits for tasks assessed                                 General Comments: Alert/oriented this AM   General Comments  Pt stating "I want to go home."    Exercises     Shoulder Instructions      Home Living Family/patient expects to be discharged to:: Private residence Living Arrangements: Spouse/significant other Available Help at Discharge: Family;Available PRN/intermittently Type of Home: House Home Access: Stairs to enter [employer] of Steps: 2 Entrance Stairs-Rails: None Home Layout: Two level;Able to live on main level with bedroom/bathroom Alternate Level Stairs-Number of Steps: 13 Alternate Level Stairs-Rails: Left Bathroom Shower/Tub: Tub/shower unit   Bathroom Toilet: Standard Bathroom Accessibility: Yes   Home Equipment: Walker - 4 wheels;Cane - single point;Hospital bed;Bedside commode          Prior Functioning/Environment Level of Independence: Needs assistance  Gait / Transfers Assistance Needed: Pt complete short community ambulation, uses Rollator ADL's / Homemaking Assistance Needed: Reports could do ADLs most of the time; states some difficutly; did sponge baths as she is unable to go upstairs to full bath            OT  Problem List: Decreased strength;Decreased activity tolerance;Impaired balance (sitting and/or standing);Decreased coordination;Decreased safety awareness;Pain      OT Treatment/Interventions:      OT Goals(Current goals can be found in the care plan section)    OT Frequency:     Barriers to D/[name]:            Co-evaluation              AM-PAC OT "6 Clicks" Daily Activity     Outcome Measure Help from another person eating meals?: None Help from another person taking care of personal grooming?: None Help from another person toileting, which includes using toliet, bedpan, or urinal?: None Help from another person bathing (including washing, rinsing, drying)?: A Little Help from another person to put on and taking off regular upper body clothing?: None Help from another person to put on and taking off regular lower body clothing?: A Little 6 Click Score: 22   End of Session Equipment Utilized During Treatment: Rolling walker Nurse Communication: Mobility status  Activity Tolerance: Patient tolerated treatment well Patient left: in chair;with call bell/phone within reach  OT Visit Diagnosis: Unsteadiness on feet (R26.81);Pain Pain - Right/Left: Left Pain - part of body: Leg                Time: 0728-0758 OT Time Calculation (min): 30 min Charges:  OT General Charges $OT Visit: 1 Visit OT Evaluation $OT Eval Moderate Complexity: 1 Mod OT Treatments $Self Care/Home Management : 8-22 mins  Stacy [name], OTR/L Acute  Rehabilitation Services Pager: [phone] Office: [phone]   Stacy [name] 03/15/2019, 9:21 AM

## 2019-03-15 NOTE — TOC Transition Note (Signed)
Transition of Care [facility]) - CM/SW Discharge Note   Patient Details  Name: Stacy [name] [facility] MRN: [id number] Date of Birth: [birth year]  Transition of Care [facility]) CM/SW Contact:  Lawerance Sabal, RN Phone Number: 03/15/2019, 10:18 AM   Clinical Narrative:    Trapeze ordered to be attached to hospital bed at her home Frances [facility] will cover for [facility] services. Other DME to be provided from unit stock by floor staff.     Final next level of care: Home w Home Health Services Barriers to Discharge: No Barriers Identified   Patient Goals and CMS Choice Patient states their goals for this hospitalization and ongoing recovery are:: patient wants to return home CMS Medicare.gov Compare Post Acute Care list provided to:: Patient Choice offered to / list presented to : Patient  Discharge Placement                       Discharge Plan and Services                DME Arranged: Trapeze DME Agency: [employer] Date DME Agency Contacted: 03/15/19 Time DME Agency Contacted: 1018 Representative spoke with at DME Agency: zack HH Arranged: PT HH Agency: [facility] Date [facility] Agency Contacted: 03/15/19 Time HH Agency Contacted: 1018 Representative spoke with at [facility] Agency: cory  Social Determinants of Health (SDOH) Interventions     Readmission Risk Interventions No flowsheet data found.

## 2019-03-15 NOTE — Progress Notes (Signed)
Physical Therapy Treatment Patient Details Name: [name] MRN: [id number] DOB: May 02, 1960 Today's Date: 03/15/2019    History of Present Illness Pt is 59 yo female s/p L hip anterior THA on 03/14/19.  She has PMH significant of COPD, DM2 , R TKA, gout, OA, ashtma, GERD, and back surgeries.    PT Comments    Pt progressing towards physical therapy goals. Was able to demonstrate transfers and ambulation with increased independence and RW for support. Pt was issued HEP handout and we reviewed seated and supine exercises. Recommend pt wait for HHPT to initiate standing exercise at this time as pt is still at a high risk for falls. Noted that pt originally was planning for SNF placement at d/c however is now interested in d/c home - feel this is appropriate given she is grossly mobilizing at a min guard to supervision level and will have assistance available at home. Will continue to follow and progress as able per POC.     Follow Up Recommendations  Follow surgeon's recommendation for DC plan and follow-up therapies     Equipment Recommendations  Rolling walker with 5" wheels    Recommendations for Other Services       Precautions / Restrictions Precautions Precautions: None;Fall Restrictions Weight Bearing Restrictions: Yes LLE Weight Bearing: Weight bearing as tolerated    Mobility  Bed Mobility               General bed mobility comments: Pt sitting in the chair upon PT arrival. Reports using the trapeze and feeling the need for it at home.   Transfers Overall transfer level: Needs assistance Equipment used: Rolling walker (2 wheeled) Transfers: Sit to/from Stand Sit to Stand: Supervision         General transfer comment: cues for safe hand placement  Ambulation/Gait Ambulation/Gait assistance: Min guard Gait Distance (Feet): 150 Feet Assistive device: Rolling walker (2 wheeled) Gait Pattern/deviations: Step-to pattern;Decreased stride length Gait  velocity: decreased Gait velocity interpretation: <1.31 ft/sec, indicative of household ambulator General Gait Details: VC's for step-through gait pattern, increased heel strike, and general safety with RW use.    Stairs             Wheelchair Mobility    Modified Rankin (Stroke Patients Only)       Balance Overall balance assessment: Needs assistance Sitting-balance support: Bilateral upper extremity supported;Feet supported Sitting balance-Leahy Scale: Fair     Standing balance support: Single extremity supported;During functional activity Standing balance-Leahy Scale: Fair Standing balance comment: statically. poor with dynamic balance activity.                             Cognition Arousal/Alertness: Awake/alert Behavior During Therapy: WFL for tasks assessed/performed Overall Cognitive Status: Within Functional Limits for tasks assessed                                 General Comments: Alert/oriented this AM      Exercises Total Joint Exercises Ankle Circles/Pumps: 10 reps Quad Sets: 10 reps Heel Slides: 10 reps Hip ABduction/ADduction: 10 reps Long Arc Quad: 10 reps    General Comments        Pertinent Vitals/Pain Pain Assessment: Faces Faces Pain Scale: Hurts little more Pain Location: L hip Pain Descriptors / Indicators: Sore;Discomfort Pain Intervention(s): Limited activity within patient's tolerance;Monitored during session;Repositioned    Home Living  Prior Function            PT Goals (current goals can now be found in the care plan section) Acute Rehab PT Goals PT Goal Formulation: With patient Time For Goal Achievement: 03/28/19 Potential to Achieve Goals: Good Progress towards PT goals: Progressing toward goals    Frequency    7X/week      PT Plan Current plan remains appropriate    Co-evaluation              AM-PAC PT "6 Clicks" Mobility   Outcome Measure   Help needed turning from your back to your side while in a flat bed without using bedrails?: A Little Help needed moving from lying on your back to sitting on the side of a flat bed without using bedrails?: A Lot Help needed moving to and from a bed to a chair (including a wheelchair)?: A Little Help needed standing up from a chair using your arms (e.g., wheelchair or bedside chair)?: A Little Help needed to walk in hospital room?: A Little Help needed climbing 3-5 steps with a railing? : A Lot 6 Click Score: 16    End of Session Equipment Utilized During Treatment: Gait belt Activity Tolerance: Patient tolerated treatment well Patient left: in chair;with call bell/phone within reach Nurse Communication: Mobility status PT Visit Diagnosis: Other abnormalities of gait and mobility (R26.89);Muscle weakness (generalized) (M62.81)     Time: 6256-3893 PT Time Calculation (min) (ACUTE ONLY): 40 min  Charges:  $Gait Training: 23-37 mins $Therapeutic Exercise: 8-22 mins                     Conni Slipper, PT, DPT Acute Rehabilitation Services Pager: [phone] Office: [phone]    Marylynn Pearson 03/15/2019, 1:51 PM

## 2019-03-16 ENCOUNTER — Encounter: Payer: Self-pay | Admitting: *Deleted

## 2019-03-16 ENCOUNTER — Telehealth: Payer: Self-pay | Admitting: Orthopaedic Surgery

## 2019-03-16 DIAGNOSIS — Z471 Aftercare following joint replacement surgery: Secondary | ICD-10-CM | POA: Diagnosis not present

## 2019-03-16 DIAGNOSIS — F411 Generalized anxiety disorder: Secondary | ICD-10-CM | POA: Diagnosis not present

## 2019-03-16 NOTE — Anesthesia Postprocedure Evaluation (Signed)
Anesthesia Post Note  Patient: Stacy [name]  Procedure(s) Performed: LEFT TOTAL HIP ARTHROPLASTY ANTERIOR APPROACH (Left Hip)     Patient location during evaluation: PACU Anesthesia Type: General Level of consciousness: awake and alert Pain management: pain level controlled Vital Signs Assessment: post-procedure vital signs reviewed and stable Respiratory status: spontaneous breathing, nonlabored ventilation, respiratory function stable and patient connected to nasal cannula oxygen Cardiovascular status: blood pressure returned to baseline and stable Postop Assessment: no apparent nausea or vomiting Anesthetic complications: no    Last Vitals:  Vitals:   03/15/19 0823 03/15/19 1222  BP:  (!) 90/59  Pulse:  91  Resp:  18  Temp:  37 C  SpO2: 95% 98%    Last Pain:  Vitals:   03/15/19 1222  TempSrc: Oral  PainSc:                  Geraldo Haris

## 2019-03-16 NOTE — Telephone Encounter (Signed)
Gracy from [facility] called.  She is requesting verbal orders for Home health PT 1wk1 3wk2   Call back number: [phone]

## 2019-03-17 NOTE — Telephone Encounter (Signed)
Verbal order given  

## 2019-03-21 ENCOUNTER — Other Ambulatory Visit: Payer: Self-pay | Admitting: Physical Medicine & Rehabilitation

## 2019-03-21 ENCOUNTER — Other Ambulatory Visit: Payer: Self-pay | Admitting: Internal Medicine

## 2019-03-21 DIAGNOSIS — E119 Type 2 diabetes mellitus without complications: Secondary | ICD-10-CM

## 2019-03-21 DIAGNOSIS — I10 Essential (primary) hypertension: Secondary | ICD-10-CM

## 2019-03-23 DIAGNOSIS — F411 Generalized anxiety disorder: Secondary | ICD-10-CM | POA: Diagnosis not present

## 2019-03-24 ENCOUNTER — Telehealth: Payer: Self-pay | Admitting: Orthopaedic Surgery

## 2019-03-24 NOTE — Telephone Encounter (Signed)
Received call from Glenice Laine -nurse with [facility] advised when patient came home 03/15/2019 patient stopped taking her aspirin 81 mg. Lynden Ang said patient is still taking her plavix. The number to contact patient is [phone]

## 2019-03-27 ENCOUNTER — Other Ambulatory Visit: Payer: Self-pay

## 2019-03-27 ENCOUNTER — Emergency Department (HOSPITAL_COMMUNITY)
Admission: EM | Admit: 2019-03-27 | Discharge: 2019-03-27 | Disposition: A | Discharge status: Home / Self Care | Payer: Medicaid Other | Attending: Emergency Medicine | Admitting: Emergency Medicine

## 2019-03-27 ENCOUNTER — Encounter (HOSPITAL_COMMUNITY): Payer: Self-pay | Admitting: Emergency Medicine

## 2019-03-27 ENCOUNTER — Telehealth: Payer: Self-pay

## 2019-03-27 DIAGNOSIS — E1142 Type 2 diabetes mellitus with diabetic polyneuropathy: Secondary | ICD-10-CM | POA: Diagnosis not present

## 2019-03-27 DIAGNOSIS — F17210 Nicotine dependence, cigarettes, uncomplicated: Secondary | ICD-10-CM | POA: Insufficient documentation

## 2019-03-27 DIAGNOSIS — R112 Nausea with vomiting, unspecified: Secondary | ICD-10-CM | POA: Diagnosis not present

## 2019-03-27 DIAGNOSIS — R52 Pain, unspecified: Secondary | ICD-10-CM | POA: Diagnosis not present

## 2019-03-27 DIAGNOSIS — I10 Essential (primary) hypertension: Secondary | ICD-10-CM | POA: Insufficient documentation

## 2019-03-27 DIAGNOSIS — J3081 Allergic rhinitis due to animal (cat) (dog) hair and dander: Secondary | ICD-10-CM | POA: Diagnosis not present

## 2019-03-27 DIAGNOSIS — J449 Chronic obstructive pulmonary disease, unspecified: Secondary | ICD-10-CM | POA: Diagnosis not present

## 2019-03-27 DIAGNOSIS — R1084 Generalized abdominal pain: Secondary | ICD-10-CM | POA: Diagnosis not present

## 2019-03-27 DIAGNOSIS — R109 Unspecified abdominal pain: Secondary | ICD-10-CM | POA: Diagnosis present

## 2019-03-27 DIAGNOSIS — R110 Nausea: Secondary | ICD-10-CM | POA: Diagnosis not present

## 2019-03-27 DIAGNOSIS — J301 Allergic rhinitis due to pollen: Secondary | ICD-10-CM | POA: Diagnosis not present

## 2019-03-27 DIAGNOSIS — J3089 Other allergic rhinitis: Secondary | ICD-10-CM | POA: Diagnosis not present

## 2019-03-27 LAB — COMPREHENSIVE METABOLIC PANEL
ALT: 14 U/L (ref 0–44)
AST: 21 U/L (ref 15–41)
Albumin: 3.7 g/dL (ref 3.5–5.0)
Alkaline Phosphatase: 107 U/L (ref 38–126)
Anion gap: 12 (ref 5–15)
BUN: 7 mg/dL (ref 6–20)
CO2: 26 mmol/L (ref 22–32)
Calcium: 9.5 mg/dL (ref 8.9–10.3)
Chloride: 98 mmol/L (ref 98–111)
Creatinine, Ser: 0.81 mg/dL (ref 0.44–1.00)
GFR calc Af Amer: >60 mL/min (ref >60)
GFR calc non Af Amer: >60 mL/min (ref >60)
Glucose, Bld: 180 mg/dL — ABNORMAL HIGH (ref 70–99)
Potassium: 3.9 mmol/L (ref 3.5–5.1)
Sodium: 136 mmol/L (ref 135–145)
Total Bilirubin: 0.4 mg/dL (ref 0.3–1.2)
Total Protein: 7.2 g/dL (ref 6.5–8.1)

## 2019-03-27 LAB — CBC
HCT: 33 % — ABNORMAL LOW (ref 36.0–46.0)
Hemoglobin: 10.5 g/dL — ABNORMAL LOW (ref 12.0–15.0)
MCH: 27.7 pg (ref 26.0–34.0)
MCHC: 31.8 g/dL (ref 30.0–36.0)
MCV: 87.1 fL (ref 80.0–100.0)
Platelets: 493 10*3/uL — ABNORMAL HIGH (ref 150–400)
RBC: 3.79 MIL/uL — ABNORMAL LOW (ref 3.87–5.11)
RDW: 15.3 % (ref 11.5–15.5)
WBC: 8 10*3/uL (ref 4.0–10.5)
nRBC: 0 % (ref 0.0–0.2)

## 2019-03-27 LAB — URINALYSIS, ROUTINE W REFLEX MICROSCOPIC
Bilirubin Urine: NEGATIVE
Glucose, UA: NEGATIVE mg/dL
Hgb urine dipstick: NEGATIVE
Ketones, ur: NEGATIVE mg/dL
Leukocytes,Ua: NEGATIVE
Nitrite: NEGATIVE
Protein, ur: NEGATIVE mg/dL
Specific Gravity, Urine: 1.01 (ref 1.005–1.030)
pH: 6 (ref 5.0–8.0)

## 2019-03-27 LAB — I-STAT BETA HCG BLOOD, ED (MC, WL, AP ONLY): I-stat hCG, quantitative: <5 m[IU]/mL (ref <5)

## 2019-03-27 LAB — LIPASE, BLOOD: Lipase: 32 U/L (ref 11–51)

## 2019-03-27 LAB — TSH: TSH: 2.673 u[IU]/mL (ref 0.350–4.500)

## 2019-03-27 MED ORDER — SODIUM CHLORIDE 0.9 % IV BOLUS
500.0000 mL | Freq: Once | INTRAVENOUS | Status: AC
  Administered 2019-03-27: 500 mL via INTRAVENOUS

## 2019-03-27 MED ORDER — ONDANSETRON 4 MG PO TBDP: 4.0000 mg | ORAL_TABLET | Freq: Three times a day (TID) | ORAL | 0 refills | Status: DC | PRN

## 2019-03-27 MED ORDER — DIPHENHYDRAMINE HCL 50 MG/ML IJ SOLN
25.0000 mg | Freq: Once | INTRAMUSCULAR | Status: AC
  Administered 2019-03-27: 25 mg via INTRAVENOUS
  Filled 2019-03-27: qty 1

## 2019-03-27 MED ORDER — METHYLPREDNISOLONE SODIUM SUCC 125 MG IJ SOLR
125.0000 mg | Freq: Once | INTRAMUSCULAR | Status: AC
  Administered 2019-03-27: 125 mg via INTRAVENOUS
  Filled 2019-03-27: qty 2

## 2019-03-27 NOTE — Telephone Encounter (Signed)
Stacy [name] with [facility] called me stating they had gone out to see patient today, walking around doing her exercises and she suddenly became faint, and started convulsing like she was having a seizure with vomiting. EMS came and took her to ED.

## 2019-03-27 NOTE — Discharge Instructions (Signed)

## 2019-03-27 NOTE — ED Triage Notes (Signed)
Pt arrives via EMS from home after getting an allergy shot this morning around 830 then began to have abd pain, N/V. Pt had her physical therapist come over to work with her today and while walking, she developed tremors.

## 2019-03-27 NOTE — ED Provider Notes (Signed)
Emergency Department Provider Note   I have reviewed the triage vital signs and the nursing notes.   HISTORY  Chief Complaint Abdominal Pain   HPI Stacy [name] is a 59 y.o. female with PMH reviewed below presents to the emergency department for evaluation of tremors with abdominal discomfort and nausea after receiving her allergy shot injection today.  Patient goes every 2 weeks for her "allergy shot" but is not sure exactly what is in the injection.  She has never had issues with this before.  She returned home and was feeling okay but when her home nurse showed up for physical therapy she found her to be tremulous in the upper extremities and complaining of abdominal pain with some nausea.  Patient denies any shortness of breath or chest pain.  She is not experiencing rash.  Patient denies any throat or tongue swelling.  She is not having diarrhea but did have 1 episode of vomiting with nausea which was nonbloody.  She feels a tremor in her upper extremities which is unaffected by movement.  Denies headaches. No similar symptoms in the past.    Past Medical History:  Diagnosis Date  . Allergy    Shellfish, cleaning products  . Anxiety   . Arthritis   . 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 Asthma   . COPD (chronic obstructive pulmonary disease) (HCC)   . Depression   . Diabetes mellitus    Type II  . GERD (gastroesophageal reflux disease)    Pt on Protonix daily  . Glaucoma   . Gout   . Headache(784.0)    otc meds prn  . Hyperlipidemia   . Hypertension   . Irritable bowel syndrome 11/19/2010  . Neuropathy   . Pneumonia YRS AGO  . Restless legs   . Shortness of breath    07/14/2018- uses  4 times a day    Patient Active Problem List   Diagnosis Date Noted  . 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  . Post laminectomy syndrome 02/23/2019  . Abnormality of gait 02/23/2019  . 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 Unilateral primary  osteoarthritis, left hip 12/28/2018  . Lesion of skin of left ear 12/26/2018  . Primary osteoarthritis of left hip 12/02/2018  . Iron deficiency anemia 10/16/2018  . Chronic pain syndrome 09/08/2018  . Chronic pain of right knee 08/11/2018  . Status post lumbar laminectomy 07/15/2018  . Moderate persistent asthma without complication 06/29/2017  . Environmental and seasonal allergies 06/29/2017  . Controlled type 2 diabetes mellitus with diabetic polyneuropathy, without Martel Galvan-term current use of insulin (HCC) 06/29/2017  . Perennial allergic rhinitis 04/08/2017  . Sensorineural hearing loss (SNHL), bilateral 04/08/2017  . Chronic pansinusitis 03/25/2017  . Eustachian tube dysfunction, bilateral 03/25/2017  . 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 Herniation of lumbar intervertebral disc with radiculopathy 10/02/2016    Class: Chronic  . Alopecia areata 08/19/2016  . Chondromalacia of both patellae 06/03/2015    Class: Chronic  . Spinal stenosis, lumbar region, with neurogenic claudication 06/03/2015  . Tobacco use disorder 04/25/2015  . DJD (degenerative joint disease) of knee 01/04/2015  . Hemorrhoid 11/14/2014  . Gout of big toe 07/19/2014  . Essential hypertension 08/14/2013  . Gastroesophageal reflux disease without esophagitis 08/14/2013  . COPD (chronic obstructive pulmonary disease) (HCC) 04/17/2011    Past Surgical History:  Procedure Laterality Date  . BACK SURGERY    . CHOLECYSTECTOMY    . COLONOSCOPY    .  ENDOMETRIAL ABLATION  10/2010  . EYE SURGERY    . HERNIA REPAIR     umbicial hernia  . JOINT REPLACEMENT    . KNEE ARTHROSCOPY Left    06/07/2017 Dr. August Saucer of [facility]  . KNEE CLOSED REDUCTION Right 12/06/2015   Procedure: CLOSED MANIPULATION RIGHT KNEE;  Surgeon: Kerrin Champagne, MD;  Location: [facility] OR;  Service: Orthopedics;  Laterality: Right;  . KNEE CLOSED REDUCTION Right 01/17/2016   Procedure: CLOSED MANIPULATION RIGHT KNEE;  Surgeon: Kerrin Champagne, MD;   Location: [facility] OR;  Service: Orthopedics;  Laterality: Right;  . KNEE JOINT MANIPULATION Right 12/06/2015  . LACRIMAL TUBE INSERTION Bilateral 03/01/2019   Procedure: LACRIMAL TUBE INSERTION;  Surgeon: Aura Camps, MD;  Location: [facility];  Service: Ophthalmology;  Laterality: Bilateral;  . LUMBAR DISC SURGERY  06/03/2015   L 2  L3 L4 L5   . LUMBAR LAMINECTOMY/DECOMPRESSION MICRODISCECTOMY N/A 06/03/2015   Procedure: Bilateral lateral recess decompression L2-3, L3-4, L4-5;  Surgeon: Kerrin Champagne, MD;  Location: [facility] OR;  Service: Orthopedics;  Laterality: N/A;  . LUMBAR LAMINECTOMY/DECOMPRESSION MICRODISCECTOMY N/A 10/02/2016   Procedure: Right L5-S1 Lateral Recess Decompression  microdiscectomy;  Surgeon: Kerrin Champagne, MD;  Location[facility];  Service: Orthopedics;  Laterality: N/A;  . LUMBAR LAMINECTOMY/DECOMPRESSION MICRODISCECTOMY N/A 07/15/2018   Procedure: LEFT L3-4 MICRODISCECTOMY;  Surgeon: Kerrin Champagne, MD;  Location: [facility] OR;  Service: Orthopedics;  Laterality: N/A;  . svd      x 2  . TEAR DUCT PROBING Bilateral 03/01/2019   Procedure: TEAR DUCT PROBING WITH IRRIGATION;  Surgeon: Aura Camps, MD;  Location[facility];  Service: Ophthalmology;  Laterality: Bilateral;  . TOTAL HIP ARTHROPLASTY Left 03/14/2019   Procedure: LEFT TOTAL HIP ARTHROPLASTY ANTERIOR APPROACH;  Surgeon: [name], MD;  Location: [facility] OR;  Service: Orthopedics;  Laterality: Left;  . TOTAL KNEE ARTHROPLASTY Right 09/06/2015   Procedure: RIGHT TOTAL KNEE ARTHROPLASTY;  Surgeon: Kerrin Champagne, MD;  Location: [facility] OR;  Service: Orthopedics;  Laterality: Right;  . TUBAL LIGATION    . UPPER GASTROINTESTINAL ENDOSCOPY  04/28/11    Allergies Other, Shellfish allergy, Ace inhibitors, and Aspirin  Family History  Problem Relation Age of Onset  . Hypertension Father   . Cancer Father   . Heart disease Mother   . Asthma Son        had as a child  . Heart disease Sister     . Breast cancer Sister   . Hypertension Brother     Social History Social History   Tobacco Use  . Smoking status: Current Some Day Smoker    Packs/day: 0.25    Years: 32.00    Pack years: 8.00    Types: Cigarettes  . Smokeless tobacco: Never Used  . Tobacco comment: DOWN TO 3 PER DAY  Substance Use Topics  . Alcohol use: No  . Drug use: No    Review of Systems  Constitutional: No fever/chills Eyes: No visual changes. ENT: No sore throat. Cardiovascular: Denies chest pain. Respiratory: Denies shortness of breath. Gastrointestinal: Positive diffuse abdominal pain. Positive nausea and vomiting.  No diarrhea.  No constipation. Genitourinary: Negative for dysuria. Musculoskeletal: Negative for back pain. Skin: Negative for rash. Neurological: Negative for headaches, focal weakness or numbness. Positive upper extremity tremor.   10-point ROS otherwise negative.  ____________________________________________   PHYSICAL EXAM:  VITAL SIGNS: ED Triage Vitals [03/27/19 1418]  Enc Vitals Group  BP (!) 130/96     Pulse Rate 90     Resp 16     Temp 98.5 F (36.9 C)     Temp Source Oral     SpO2 99 %     Weight 173 lb (78.5 kg)     Height 5\' 6"  (1.676 m)   Constitutional: Alert and oriented. Well appearing and in no acute distress. Eyes: Conjunctivae are normal. Head: Atraumatic. Nose: No congestion/rhinnorhea. Mouth/Throat: Mucous membranes are moist.  Neck: No stridor. Cardiovascular: Normal rate, regular rhythm. Good peripheral circulation. Grossly normal heart sounds.   Respiratory: Normal respiratory effort.  No retractions. Lungs CTAB. Gastrointestinal: Soft and nontender. No distention.  Musculoskeletal: No lower extremity tenderness nor edema. No gross deformities of extremities. Neurologic:  Normal speech and language. No gross focal neurologic deficits are appreciated.  Notable coarse tremor in the bilateral upper extremities.  Lower extremities do not  appear affected.  No unilateral weakness/numbness.  Skin:  Skin is warm, dry and intact. No rash noted.   ____________________________________________   LABS (all labs ordered are listed, but only abnormal results are displayed)  Labs Reviewed  COMPREHENSIVE METABOLIC PANEL - Abnormal; Notable for the following components:      Result Value   Glucose, Bld 180 (*)    All other components within normal limits  CBC - Abnormal; Notable for the following components:   RBC 3.79 (*)    Hemoglobin 10.5 (*)    HCT 33.0 (*)    Platelets 493 (*)    All other components within normal limits  LIPASE, BLOOD  URINALYSIS, ROUTINE W REFLEX MICROSCOPIC  TSH  I-STAT BETA HCG BLOOD, ED ([facility], [facility], [facility] ONLY)   ____________________________________________  EKG   EKG Interpretation  Date/Time:  Monday March 27 2019 14:21:49 EST Ventricular Rate:  80 PR Interval:  122 QRS Duration: 86 QT Interval:  300 QTC Calculation: 346 R Axis:   59 Text Interpretation: Normal sinus rhythm Nonspecific T wave abnormality Abnormal ECG No STEMI Confirmed by 07-09-1995 [phone]) on 03/27/2019 7:24:06 PM       ____________________________________________  RADIOLOGY  None ____________________________________________   PROCEDURES  Procedure(s) performed:   Procedures  None ____________________________________________   INITIAL IMPRESSION / ASSESSMENT AND PLAN / ED COURSE  Pertinent labs & imaging results that were available during my care of the patient were reviewed by me and considered in my medical decision making (see chart for details).   Patient presents to the emergency department with tremor, abdominal discomfort, nausea/vomiting.  This is in the setting of an allergy injection this morning.  No immediate symptoms after injection.  Patient's abdomen is diffusely soft and nontender.  She has mild tachypnea on arrival documented but does not appear short of breath on my evaluation.  She is  not tachypneic for me.  Vital signs are otherwise normal.  Suspect reaction to her injection clinically with moderate allergic reaction type symptoms.  Patient was given Solu-Medrol and Benadryl and symptoms improved significantly.   Patient reassessed in the ED multiple times after meds given. Patient feeling much better and tolerating PO. Tremor has resolved. She will discussed symptoms with her allergist prior to receiving additional shots. Abdomen remains soft and non-tender. No imaging on emergent basis with resolution of symptoms.  ____________________________________________  FINAL CLINICAL IMPRESSION(S) / ED DIAGNOSES  Final diagnoses:  Non-intractable vomiting with nausea, unspecified vomiting type     MEDICATIONS GIVEN DURING THIS VISIT:  Medications  sodium chloride 0.9 % bolus 500  mL (0 mLs Intravenous Stopped 03/27/19 1926)  diphenhydrAMINE (BENADRYL) injection 25 mg (25 mg Intravenous Given 03/27/19 1648)  methylPREDNISolone sodium succinate (SOLU-MEDROL) 125 mg/2 mL injection 125 mg (125 mg Intravenous Given 03/27/19 1648)     NEW OUTPATIENT MEDICATIONS STARTED DURING THIS VISIT:  Discharge Medication List as of 03/27/2019 10:00 PM    START taking these medications   Details  ondansetron (ZOFRAN ODT) 4 MG disintegrating tablet Take 1 tablet (4 mg total) by mouth every 8 (eight) hours as needed for nausea or vomiting., Starting Mon 03/27/2019, Normal        Note:  This document was prepared using Dragon voice recognition software and may include unintentional dictation errors.  Nanda Quinton, MD, [facility] Emergency Medicine    Jesly Hartmann, [facility], MD 03/28/19 [phone]

## 2019-03-27 NOTE — ED Notes (Signed)
Discharge instructions and prescriptions discussed with Pt. Pt verbalized understanding. Pt stable and ambulatory.   

## 2019-03-27 NOTE — ED Notes (Addendum)
Pt provided graham crackers & water for PO challenge 

## 2019-03-28 ENCOUNTER — Ambulatory Visit (INDEPENDENT_AMBULATORY_CARE_PROVIDER_SITE_OTHER): Payer: Medicaid Other | Admitting: Orthopaedic Surgery

## 2019-03-28 ENCOUNTER — Encounter: Payer: Self-pay | Admitting: Orthopaedic Surgery

## 2019-03-28 DIAGNOSIS — Z96642 Presence of left artificial hip joint: Secondary | ICD-10-CM

## 2019-03-28 MED ORDER — OXYCODONE HCL 5 MG PO TABS: 5.0000 mg | ORAL_TABLET | ORAL | 0 refills | Status: DC | PRN

## 2019-03-28 NOTE — Progress Notes (Signed)
Office Visit Note   Patient: Stacy [name]           Date of Birth: [birth year]           MRN: [id number] Visit Date: 03/28/2019              Requested by: Marcine Matar, MD [address] [place],  [place] [address] PCP: Marcine Matar, MD   Assessment & Plan: Visit Diagnoses:  1. Status post total replacement of left hip     Plan:  She will work on scar tissue mobilization.  Also continue to work on range of motion strengthening of the hip.  She will follow-up with [place] in 4 weeks sooner if there is any questions concerns.  Questions were encouraged and answered today at length.  Follow-Up Instructions: Return in about 4 weeks (around 04/25/2019).   Orders:  No orders of the defined types were placed in this encounter.  Meds ordered this encounter  Medications  . oxyCODONE (OXY IR/ROXICODONE) 5 MG immediate release tablet    Sig: Take 1-2 tablets (5-10 mg total) by mouth every 4 (four) hours as needed for moderate pain (pain score 4-6).    Dispense:  30 tablet    Refill:  0      Procedures: No procedures performed   Clinical Data: No additional findings.   Subjective: Chief Complaint  Patient presents with  . Left Hip - Routine Post Op    HPI Ms. Lovin is now 2 weeks status post left total hip arthroplasty.  She states she is overall doing well range of motion and strength are increasing.  She is walking with a rolling walker.  She was seen in the ER yesterday due to some "nausea and shaking" she was given some Zofran which she has not picked up.  She states she is feeling better today.  She has had no shortness of breath fevers chills.  She is on chronic Plavix. Review of Systems Please see HPI  Objective: Vital Signs: LMP 09/01/2010   Physical Exam General: Well-developed well-nourished female no acute distress mood and affect appropriate Ortho Exam Left hip good range of motion without pain.  Left calf supple nontender.  Dorsiflexion  plantarflexion ankle intact.  Surgical incisions well approximated with staples no signs of infection or wound dehiscence. Specialty Comments:  No specialty comments available.  Imaging: No results found.   PMFS History: Patient Active Problem List   Diagnosis Date Noted  . 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  . Post laminectomy syndrome 02/23/2019  . Abnormality of gait 02/23/2019  . 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 Unilateral primary osteoarthritis, left hip 12/28/2018  . Lesion of skin of left ear 12/26/2018  . Primary osteoarthritis of left hip 12/02/2018  . Iron deficiency anemia 10/16/2018  . Chronic pain syndrome 09/08/2018  . Chronic pain of right knee 08/11/2018  . Status post lumbar laminectomy 07/15/2018  . Moderate persistent asthma without complication 06/29/2017  . Environmental and seasonal allergies 06/29/2017  . Controlled type 2 diabetes mellitus with diabetic polyneuropathy, without long-term current use of insulin (HCC) 06/29/2017  . Perennial allergic rhinitis 04/08/2017  . Sensorineural hearing loss (SNHL), bilateral 04/08/2017  . Chronic pansinusitis 03/25/2017  . Eustachian tube dysfunction, bilateral 03/25/2017  . 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 Herniation of lumbar intervertebral disc with radiculopathy 10/02/2016    Class: Chronic  . Alopecia areata 08/19/2016  . Chondromalacia of both patellae 06/03/2015    Class:  Chronic  . Spinal stenosis, lumbar region, with neurogenic claudication 06/03/2015  . Tobacco use disorder 04/25/2015  . DJD (degenerative joint disease) of knee 01/04/2015  . Hemorrhoid 11/14/2014  . Gout of big toe 07/19/2014  . Essential hypertension 08/14/2013  . Gastroesophageal reflux disease without esophagitis 08/14/2013  . COPD (chronic obstructive pulmonary disease) (HCC) 04/17/2011   Past Medical History:  Diagnosis Date  . Allergy    Shellfish, cleaning products  . Anxiety   . Arthritis   .  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 Asthma   . COPD (chronic obstructive pulmonary disease) (HCC)   . Depression   . Diabetes mellitus    Type II  . GERD (gastroesophageal reflux disease)    Pt on Protonix daily  . Glaucoma   . Gout   . Headache(784.0)    otc meds prn  . Hyperlipidemia   . Hypertension   . Irritable bowel syndrome 11/19/2010  . Neuropathy   . Pneumonia YRS AGO  . Restless legs   . Shortness of breath    07/14/2018- uses  4 times a day    Family History  Problem Relation Age of Onset  . Hypertension Father   . Cancer Father   . Heart disease Mother   . Asthma Son        had as a child  . Heart disease Sister   . Breast cancer Sister   . Hypertension Brother     Past Surgical History:  Procedure Laterality Date  . BACK SURGERY    . CHOLECYSTECTOMY    . COLONOSCOPY    . ENDOMETRIAL ABLATION  10/2010  . EYE SURGERY    . HERNIA REPAIR     umbicial hernia  . JOINT REPLACEMENT    . KNEE ARTHROSCOPY Left    06/07/2017 Dr. August [facility]ucer [facility]  . KNEE CLOSED REDUCTION Right 12/06/2015   Procedure: CLOSED MANIPULATION RIGHT KNEE;  Surgeon: Kerrin Champagne, MD;  Location: [facility] OR;  Service: Orthopedics;  Laterality: Right;  . KNEE CLOSED REDUCTION Right 01/17/2016   Procedure: CLOSED MANIPULATION RIGHT KNEE;  Surgeon: Kerrin Champagne, MD;  Location: [facility] OR;  Service: Orthopedics;  Laterality: Right;  . KNEE JOINT MANIPULATION Right 12/06/2015  . LACRIMAL TUBE INSERTION Bilateral 03/01/2019   Procedure: LACRIMAL TUBE INSERTION;  Surgeon: Aura Camps, MD;  Location: [facility];  Service: Ophthalmology;  Laterality: Bilateral;  . LUMBAR DISC SURGERY  06/03/2015   L 2  L3 L4 L5   . LUMBAR LAMINECTOMY/DECOMPRESSION MICRODISCECTOMY N/A 06/03/2015   Procedure: Bilateral lateral recess decompression L2-3, L3-4, L4-5;  Surgeon: Kerrin Champagne, MD;  Location: [facility] OR;  Service: Orthopedics;  Laterality: N/A;  . LUMBAR  LAMINECTOMY/DECOMPRESSION MICRODISCECTOMY N/A 10/02/2016   Procedure: Right L5-S1 Lateral Recess Decompression  microdiscectomy;  Surgeon: Kerrin Champagne, MD;  [facility];  Service: Orthopedics;  Laterality: N/A;  . LUMBAR LAMINECTOMY/DECOMPRESSION MICRODISCECTOMY N/A 07/15/2018   Procedure: LEFT L3-4 MICRODISCECTOMY;  Surgeon: Kerrin Champagne, MD;  Location: [facility] OR;  Service: Orthopedics;  Laterality: N/A;  . svd      x 2  . TEAR DUCT PROBING Bilateral 03/01/2019   Procedure: TEAR DUCT PROBING WITH IRRIGATION;  Surgeon: Aura Camps, MD;  Location: [facility];  Service: Ophthalmology;  Laterality: Bilateral;  . TOTAL HIP ARTHROPLASTY Left 03/14/2019   Procedure: LEFT TOTAL HIP ARTHROPLASTY ANTERIOR APPROACH;  Surgeon: [name], MD;  Location: [facility] OR;  Service: Orthopedics;  Laterality: Left;  . TOTAL KNEE ARTHROPLASTY Right 09/06/2015   Procedure: RIGHT TOTAL KNEE ARTHROPLASTY;  Surgeon: [name], MD;  Location: [facility];  Service: Orthopedics;  Laterality: Right;  . TUBAL LIGATION    . UPPER GASTROINTESTINAL ENDOSCOPY  04/28/11   Social History   Occupational History  . Occupation: unemployed    Fish farm manager: UNEMPLOYED  Tobacco Use  . Smoking status: Current Some Day Smoker    Packs/day: 0.25    Years: 32.00    Pack years: 8.00    Types: Cigarettes  . Smokeless tobacco: Never Used  . Tobacco comment: DOWN TO 3 PER DAY  Substance and Sexual Activity  . Alcohol use: No  . Drug use: No  . Sexual activity: Yes    Birth control/protection: Surgical, Post-menopausal    Comment: tubal ligation

## 2019-03-30 DIAGNOSIS — F411 Generalized anxiety disorder: Secondary | ICD-10-CM | POA: Diagnosis not present

## 2019-04-03 ENCOUNTER — Telehealth: Payer: Self-pay | Admitting: Internal Medicine

## 2019-04-03 DIAGNOSIS — J3081 Allergic rhinitis due to animal (cat) (dog) hair and dander: Secondary | ICD-10-CM | POA: Diagnosis not present

## 2019-04-03 DIAGNOSIS — J3089 Other allergic rhinitis: Secondary | ICD-10-CM | POA: Diagnosis not present

## 2019-04-03 DIAGNOSIS — J301 Allergic rhinitis due to pollen: Secondary | ICD-10-CM | POA: Diagnosis not present

## 2019-04-03 NOTE — Telephone Encounter (Signed)
Patient called saying she has tremors and wen to the ED on 03/27/19. Patient was scheduled an appt for 04/11/19 and she is concerned that the tremors will come back. Please f/u with pt.

## 2019-04-06 DIAGNOSIS — F411 Generalized anxiety disorder: Secondary | ICD-10-CM | POA: Diagnosis not present

## 2019-04-07 ENCOUNTER — Other Ambulatory Visit: Payer: Self-pay

## 2019-04-07 ENCOUNTER — Ambulatory Visit: Payer: Medicaid Other | Admitting: Podiatry

## 2019-04-07 ENCOUNTER — Encounter: Payer: Self-pay | Admitting: Podiatry

## 2019-04-07 VITALS — Temp 97.3°F

## 2019-04-07 DIAGNOSIS — B351 Tinea unguium: Secondary | ICD-10-CM | POA: Diagnosis not present

## 2019-04-07 DIAGNOSIS — M79676 Pain in unspecified toe(s): Secondary | ICD-10-CM

## 2019-04-07 DIAGNOSIS — E1142 Type 2 diabetes mellitus with diabetic polyneuropathy: Secondary | ICD-10-CM

## 2019-04-07 DIAGNOSIS — L84 Corns and callosities: Secondary | ICD-10-CM

## 2019-04-07 NOTE — Patient Instructions (Signed)
Diabetes Mellitus and Foot Care Foot care is an important part of your health, especially when you have diabetes. Diabetes may cause you to have problems because of poor blood flow (circulation) to your feet and legs, which can cause your skin to:  Become thinner and drier.  Break more easily.  Heal more slowly.  Peel and crack. You may also have nerve damage (neuropathy) in your legs and feet, causing decreased feeling in them. This means that you may not notice minor injuries to your feet that could lead to more serious problems. Noticing and addressing any potential problems early is the best way to prevent future foot problems. How to care for your feet Foot hygiene  Wash your feet daily with warm water and mild soap. Do not use hot water. Then, pat your feet and the areas between your toes until they are completely dry. Do not soak your feet as this can dry your skin.  Trim your toenails straight across. Do not dig under them or around the cuticle. File the edges of your nails with an emery board or nail file.  Apply a moisturizing lotion or petroleum jelly to the skin on your feet and to dry, brittle toenails. Use lotion that does not contain alcohol and is unscented. Do not apply lotion between your toes. Shoes and socks  Wear clean socks or stockings every day. Make sure they are not too tight. Do not wear knee-high stockings since they may decrease blood flow to your legs.  Wear shoes that fit properly and have enough cushioning. Always look in your shoes before you put them on to be sure there are no objects inside.  To break in new shoes, wear them for just a few hours a day. This prevents injuries on your feet. Wounds, scrapes, corns, and calluses  Check your feet daily for blisters, cuts, bruises, sores, and redness. If you cannot see the bottom of your feet, use a mirror or ask someone for help.  Do not cut corns or calluses or try to remove them with medicine.  If you  find a minor scrape, cut, or break in the skin on your feet, keep it and the skin around it clean and dry. You may clean these areas with mild soap and water. Do not clean the area with peroxide, alcohol, or iodine.  If you have a wound, scrape, corn, or callus on your foot, look at it several times a day to make sure it is healing and not infected. Check for: ? Redness, swelling, or pain. ? Fluid or blood. ? Warmth. ? Pus or a bad smell. General instructions  Do not cross your legs. This may decrease blood flow to your feet.  Do not use heating pads or hot water bottles on your feet. They may burn your skin. If you have lost feeling in your feet or legs, you may not know this is happening until it is too late.  Protect your feet from hot and cold by wearing shoes, such as at the beach or on hot pavement.  Schedule a complete foot exam at least once a year (annually) or more often if you have foot problems. If you have foot problems, report any cuts, sores, or bruises to your health care provider immediately. Contact a health care provider if:  You have a medical condition that increases your risk of infection and you have any cuts, sores, or bruises on your feet.  You have an injury that is not   healing.  You have redness on your legs or feet.  You feel burning or tingling in your legs or feet.  You have pain or cramps in your legs and feet.  Your legs or feet are numb.  Your feet always feel cold.  You have pain around a toenail. Get help right away if:  You have a wound, scrape, corn, or callus on your foot and: ? You have pain, swelling, or redness that gets worse. ? You have fluid or blood coming from the wound, scrape, corn, or callus. ? Your wound, scrape, corn, or callus feels warm to the touch. ? You have pus or a bad smell coming from the wound, scrape, corn, or callus. ? You have a fever. ? You have a red line going up your leg. Summary  Check your feet every day  for cuts, sores, red spots, swelling, and blisters.  Moisturize feet and legs daily.  Wear shoes that fit properly and have enough cushioning.  If you have foot problems, report any cuts, sores, or bruises to your health care provider immediately.  Schedule a complete foot exam at least once a year (annually) or more often if you have foot problems. This information is not intended to replace advice given to you by your health care provider. Make sure you discuss any questions you have with your health care provider. Document Revised: 10/12/2018 Document Reviewed: 02/21/2016 Elsevier Patient Education  2020 Elsevier Inc.  

## 2019-04-11 ENCOUNTER — Other Ambulatory Visit: Payer: Self-pay

## 2019-04-11 ENCOUNTER — Ambulatory Visit: Payer: Medicaid Other | Attending: Internal Medicine | Admitting: Internal Medicine

## 2019-04-11 DIAGNOSIS — R0683 Snoring: Secondary | ICD-10-CM | POA: Diagnosis not present

## 2019-04-11 DIAGNOSIS — G252 Other specified forms of tremor: Secondary | ICD-10-CM | POA: Diagnosis not present

## 2019-04-11 NOTE — Progress Notes (Signed)
Virtual Visit via Telephone Note Due to current restrictions/limitations of in-office visits due to the COVID-19 pandemic, this scheduled clinical appointment was converted to a telehealth visit  I connected with Stacy [name] on 04/11/19 at 9:42 a.m by telephone and verified that I am speaking with the correct person using two identifiers. I am in my office.  The patient is at home.  Only the patient and myself participated in this encounter.  I discussed the limitations, risks, security and privacy concerns of performing an evaluation and management service by telephone and the availability of in person appointments. I also discussed with the patient that there may be a patient responsible charge related to this service. The patient expressed understanding and agreed to proceed.   History of Present Illness: Pt with hx of HTN, DM with neuropathy,tob dep,HL,PAD, IDA, spinal stenosis with neurogenic claudication(s/p laminectomy 07/2018),COPD, RLS,OA knees,gout, anemia, recurrent sinusitis, receiving allergy shots from Dr. Faith Rogue.  Patient last evaluated 02/2019.  Purpose of today's visit is ER follow-up.  Pt seen in ER 03/27/2019 with complaints of tremors and nausea/vomiting that started less than an hour after receiving an allergy shot.  Patient has been receiving allergy shot from Dr. Orvil Feil but has never had a reaction like this. -She was given prednisone and Benadryl in the emergency room and felt better. Since then, she continues to have intermittent episodes of tremors in both hands that can last anywhere from 10 minutes to 1 hour. New medications Mobic and Cymbalta which were prescribed by PMR Dr. Posey Pronto.  She is also on oxycodone post recent LT THR surgery   She also would like to be evaluated for possible sleep apnea.  She states that her husband tells her that she snores really loud.  She endorses feeling unrefreshed when she awakens in the mornings and has daytime  tiredness.  Current Outpatient Medications on File Prior to Visit  Medication Sig Dispense Refill  . Accu-Chek Softclix Lancets lancets USE AS INSTRUCTED 3 TIMES A DAY 100 each 12  . albuterol (PROVENTIL) (2.5 MG/3ML) 0.083% nebulizer solution Take 3 mLs (2.5 mg total) by nebulization every 6 (six) hours as needed for wheezing. 100 vial 6  . ALLERGY RELIEF 10 MG tablet TAKE 1 TABLET (10 MG TOTAL) BY MOUTH DAILY. (Patient taking differently: Take 10 mg by mouth daily. Loratadine) 30 tablet 11  . allopurinol (ZYLOPRIM) 100 MG tablet TAKE 1 TABLET (100 MG TOTAL) BY MOUTH DAILY. 90 tablet 3  . aspirin 81 MG chewable tablet Chew 1 tablet (81 mg total) by mouth daily. 30 tablet 0  . Blood Glucose Monitoring Suppl (ACCU-CHEK AVIVA PLUS) w/Device KIT 1 each by Does not apply route 3 (three) times daily. 1 kit 0  . budesonide-formoterol (SYMBICORT) 80-4.5 MCG/ACT inhaler INHALE TWO PUFFS BY MOUTH TWICE A DAY (Patient taking differently: Inhale 2 puffs into the lungs 2 (two) times a day. ) 1 Inhaler 11  . celecoxib (CELEBREX) 200 MG capsule Take 200 mg by mouth 2 (two) times daily.    . clopidogrel (PLAVIX) 75 MG tablet Take 1 tablet (75 mg total) by mouth daily. 30 tablet 2  . diclofenac sodium (VOLTAREN) 1 % GEL Apply 4 g topically 4 (four) times daily. (Patient taking differently: Apply 4 g topically 2 (two) times daily as needed (pain). ) 5 Tube 6  . DULoxetine (CYMBALTA) 30 MG capsule TAKE 1 CAPSULE (30 MG TOTAL) BY MOUTH DAILY. 30 capsule 1  . EPINEPHrine 0.3 mg/0.3 mL IJ SOAJ injection 0.3  mg IM x 1 PRN for allergic reaction (Patient taking differently: Inject 0.3 mg into the muscle once as needed for anaphylaxis. ) 1 Device 1  . [name] 325 (65 Fe) MG tablet TAKE 1 TABLET BY MOUTH 2 (TWO) TIMES DAILY WITH A MEAL. (Patient taking differently: Take 325 mg by mouth daily. ) 60 tablet 2  . fluticasone (FLONASE) 50 MCG/ACT nasal spray Place 1 spray into both nostrils daily. 16 g 6  . gabapentin  (NEURONTIN) 600 MG tablet TAKE 1 TABLET (600 MG TOTAL) BY MOUTH 3 (THREE) TIMES DAILY. 90 tablet 1  . glimepiride (AMARYL) 2 MG tablet Take 1 tablet (2 mg total) by mouth daily. 30 tablet 2  . glucose blood (ACCU-CHEK AVIVA PLUS) test strip USE 3 TIMES A DAY AS DIRECTED BY PHYSICIAN 100 each 12  . hydrochlorothiazide (HYDRODIURIL) 25 MG tablet TAKE 1 TABLET (25 MG TOTAL) BY MOUTH DAILY. 90 tablet 3  . hydrOXYzine (ATARAX/VISTARIL) 10 MG tablet TAKE 1 TABLET IN THE MORNING AND NOON AND 2 TABLETS IN THE EVENING AS NEEDED (Patient taking differently: Take 10-20 mg by mouth See admin instructions. Take one tablet (10 mg) by mouth in the morning and at noon, take 2 tablets (20 mg) in the evening as needed for itching) 120 tablet 2  . Lancets (ACCU-CHEK SOFT TOUCH) lancets Use as instructed 100 each 12  . Melatonin 3 MG TABS Take 1 tablet (3 mg total) by mouth at bedtime as needed. (Patient taking differently: Take 3 mg by mouth at bedtime as needed (sleep). ) 30 tablet 3  . meloxicam (MOBIC) 15 MG tablet TAKE 1 TABLET (15 MG TOTAL) BY MOUTH DAILY. 30 tablet 1  . methocarbamol (ROBAXIN) 750 MG tablet Take 1 tablet (750 mg total) by mouth every 6 (six) hours as needed for muscle spasms. 40 tablet 0  . montelukast (SINGULAIR) 10 MG tablet Take 10 mg by mouth at bedtime.     . Multiple Vitamin (MULTIVITAMIN WITH MINERALS) TABS tablet Take 1 tablet by mouth daily. CENTRUM SILVER 50 plus    . nicotine (NICODERM CQ - DOSED IN MG/24 HOURS) 14 mg/24hr patch PLACE 1 PATCH ONTO THE SKIN DAILY (Patient taking differently: Place 14 mg onto the skin daily. ) 28 patch 1  . ondansetron (ZOFRAN ODT) 4 MG disintegrating tablet Take 1 tablet (4 mg total) by mouth every 8 (eight) hours as needed for nausea or vomiting. 20 tablet 0  . ondansetron (ZOFRAN) 4 MG tablet Take 1 tablet (4 mg total) by mouth daily as needed for nausea or vomiting. 20 tablet 1  . oxyCODONE (OXY IR/ROXICODONE) 5 MG immediate release tablet Take 1-2  tablets (5-10 mg total) by mouth every 4 (four) hours as needed for moderate pain (pain score 4-6). 30 tablet 0  . pantoprazole (PROTONIX) 40 MG tablet Take 1 tablet (40 mg total) by mouth daily. 30 tablet 2  . Potassium Chloride ER 20 MEQ TBCR Take 20 mEq by mouth daily. 30 tablet 6  . Respiratory Therapy Supplies (FLUTTER) DEVI Use after breathing treatment 4 times daily 1 each 0  . senna-docusate (SENOKOT S) 8.6-50 MG tablet Take 1 tablet by mouth daily as needed for mild constipation. 30 tablet 2  . simvastatin (ZOCOR) 20 MG tablet TAKE ONE TABLET BY MOUTH DAILY AT BEDTIME. (Patient taking differently: Take 20 mg by mouth at bedtime. ) 30 tablet 2  . tiotropium (SPIRIVA HANDIHALER) 18 MCG inhalation capsule Place 1 capsule (18 mcg total) into inhaler and inhale daily.  30 capsule 12  . tobramycin-dexamethasone (TOBRADEX) ophthalmic ointment Place 1 application into the left eye 2 (two) times daily at 10 am and 4 pm. (Patient taking differently: Place 1 application into both eyes 2 (two) times daily at 10 am and 4 pm. ) 3.5 g 0  . triamcinolone cream (KENALOG) 0.1 % Apply 1 application topically 2 (two) times daily as needed (lupus rash).     . valsartan-hydrochlorothiazide (DIOVAN-HCT) 160-12.5 MG tablet TAKE ONE TABLET BY MOUTH ONCE DAILY (Patient taking differently: Take 1 tablet by mouth daily. ) 30 tablet 2  . [DISCONTINUED] Fluticasone-Salmeterol (ADVAIR) 500-50 MCG/DOSE AEPB Inhale 1 puff into the lungs every 12 (twelve) hours.      . [DISCONTINUED] lisinopril (PRINIVIL,ZESTRIL) 40 MG tablet Take 40 mg by mouth daily.       No current facility-administered medications on file prior to visit.    Observations/Objective: No direct observation done as this was a telephone encounter.  Results for orders placed or performed during the hospital encounter of 03/27/19  Lipase, blood  Result Value Ref Range   Lipase 32 11 - 51 U/L  Comprehensive metabolic panel  Result Value Ref Range    Sodium 136 135 - 145 mmol/L   Potassium 3.9 3.5 - 5.1 mmol/L   Chloride 98 98 - 111 mmol/L   CO2 26 22 - 32 mmol/L   Glucose, Bld 180 (H) 70 - 99 mg/dL   BUN 7 6 - 20 mg/dL   Creatinine, Ser 0.81 0.44 - 1.00 mg/dL   Calcium 9.5 8.9 - 10.3 mg/dL   Total Protein 7.2 6.5 - 8.1 g/dL   Albumin 3.7 3.5 - 5.0 g/dL   AST 21 15 - 41 U/L   ALT 14 0 - 44 U/L   Alkaline Phosphatase 107 38 - 126 U/L   Total Bilirubin 0.4 0.3 - 1.2 mg/dL   GFR calc non Af Amer >60 >60 mL/min   GFR calc Af Amer >60 >60 mL/min   Anion gap 12 5 - 15  CBC  Result Value Ref Range   WBC 8.0 4.0 - 10.5 K/uL   RBC 3.79 (L) 3.87 - 5.11 MIL/uL   Hemoglobin 10.5 (L) 12.0 - 15.0 g/dL   HCT 33.0 (L) 36.0 - 46.0 %   MCV 87.1 80.0 - 100.0 fL   MCH 27.7 26.0 - 34.0 pg   MCHC 31.8 30.0 - 36.0 g/dL   RDW 15.3 11.5 - 15.5 %   Platelets 493 (H) 150 - 400 K/uL   nRBC 0.0 0.0 - 0.2 %  Urinalysis, Routine w reflex microscopic  Result Value Ref Range   Color, Urine YELLOW YELLOW   APPearance CLEAR CLEAR   Specific Gravity, Urine 1.010 1.005 - 1.030   pH 6.0 5.0 - 8.0   Glucose, UA NEGATIVE NEGATIVE mg/dL   Hgb urine dipstick NEGATIVE NEGATIVE   Bilirubin Urine NEGATIVE NEGATIVE   Ketones, ur NEGATIVE NEGATIVE mg/dL   Protein, ur NEGATIVE NEGATIVE mg/dL   Nitrite NEGATIVE NEGATIVE   Leukocytes,Ua NEGATIVE NEGATIVE  TSH  Result Value Ref Range   TSH 2.673 0.350 - 4.500 uIU/mL  I-Stat beta hCG blood, ED  Result Value Ref Range   I-stat hCG, quantitative <5.0 <5 mIU/mL   Comment 3            Assessment and Plan: 1. Coarse tremors -I question whether the tremors was really due to the allergy shot as episodes have continued despite the last shot having been given over 2  weeks ago. One of her new medications could be a possible cause.  I looked up Cymbalta and one of the side effects listed is that it can cause tremors.  I have told her to stop the Cymbalta for [place] to determine whether it is causing the tremors.  If the  tremors persist then we will need to refer to neurology.  She has an appointment coming up with Dr. Posey Pronto later this month.  I told her to let him know why we discontinued the Cymbalta  2. Loud snoring History suggest sleep apnea.  Will refer for sleep study - [facility] Sleep Study; Future   Follow Up Instructions:    I discussed the assessment and treatment plan with the patient. The patient was provided an opportunity to ask questions and all were answered. The patient agreed with the plan and demonstrated an understanding of the instructions.   The patient was advised to call back or seek an in-person evaluation if the symptoms worsen or if the condition fails to improve as anticipated.  I provided 15 minutes of non-face-to-face time during this encounter.   Karle Plumber, MD

## 2019-04-11 NOTE — Progress Notes (Signed)
Pt states her blood sugar was 113 this morning   Pt states she has been having tremors

## 2019-04-12 NOTE — Progress Notes (Signed)
Subjective: Stacy [name] presents today for follow up of preventative diabetic foot care, painful mycotic nails b/l that are difficult to trim. Pain interferes with ambulation. Aggravating factors include wearing enclosed shoe gear. Pain is relieved with periodic professional debridement and callus(es) b/l halluces and painful mycotic toenails b/l that are difficult to trim. Pain interferes with ambulation. Aggravating factors include wearing enclosed shoe gear. Pain is relieved with periodic professional debridement.   Stacy [name] states sheis recovering from left hip surgery. She is receiving rehab at home and is progressing well.  She voices no new pedal concerns on today's visit.   Allergies  Allergen Reactions  . Other Shortness Of Breath    UNSPECIFIED AGENTS Allergic to perfumes and cleaning products  . Shellfish Allergy Anaphylaxis    Per allergy test.  . Ace Inhibitors Cough and Other (See Comments)       . Aspirin Nausea Only     Objective: Vitals:   04/07/19 0806  Temp: (!) 97.3 F (36.3 C)    Pt 59 y.o. year old female  in NAD. AAO x 3.   Vascular Examination:  Capillary refill time to digits immediate b/l. Capillary refill time to digits <4 seconds b/l.  DP pulses 1/4 b/l. PT pulses 1/4 left, 0/4 right. Pedal hair absent b/l Skin temperature gradient within normal limits b/l.  Dermatological Examination: Pedal skin with normal turgor, texture and tone bilaterally. No open wounds bilaterally. No interdigital macerations bilaterally. Toenails 1-5 b/l elongated, dystrophic, thickened, crumbly with subungual debris and tenderness to dorsal palpation. Hyperkeratotic lesion(s) b/l hallux IPJ.  No erythema, no edema, no drainage, no flocculence.  Musculoskeletal: Normal muscle strength 5/5 to all lower extremity muscle groups bilaterally, no pain crepitus or joint limitation noted with ROM b/l and bunion deformity noted b/l  Neurological: Protective sensation  intact 5/5 intact bilaterally with 10g monofilament b/l  Assessment: 1. Pain due to onychomycosis of toenail   2. Callus   3. Diabetic peripheral neuropathy associated with type 2 diabetes mellitus (HCC)    Plan: -Continue diabetic foot care principles. Literature dispensed on today.  -Toenails 1-5 b/l were debrided in length and girth with sterile nail nippers and dremel without iatrogenic bleeding.  -Calluses b/l hallux were debrided with sterile scalpel without complication or incident. Total number debrided =2. -Patient to continue soft, supportive shoe gear daily. -Patient to report any pedal injuries to medical professional immediately. -Patient/POA to call should there be question/concern in the interim.  Return in about 3 months (around 07/08/2019) for diabetic nail trim.

## 2019-04-13 ENCOUNTER — Telehealth: Payer: Self-pay | Admitting: Internal Medicine

## 2019-04-13 DIAGNOSIS — F411 Generalized anxiety disorder: Secondary | ICD-10-CM | POA: Diagnosis not present

## 2019-04-13 DIAGNOSIS — G252 Other specified forms of tremor: Secondary | ICD-10-CM

## 2019-04-13 NOTE — Telephone Encounter (Signed)
Patient called to inform pcp that her tremors have started again and she had discontinued the cymbalta on the 9th of March. Please follow up at your earliest convenience.

## 2019-04-13 NOTE — Telephone Encounter (Signed)
Will forward to pcp

## 2019-04-14 NOTE — Telephone Encounter (Signed)
Called pt /DOB and NAME verified/ Made pt aware of Neurology referral.

## 2019-04-17 DIAGNOSIS — R2231 Localized swelling, mass and lump, right upper limb: Secondary | ICD-10-CM | POA: Diagnosis not present

## 2019-04-17 DIAGNOSIS — L930 Discoid lupus erythematosus: Secondary | ICD-10-CM | POA: Diagnosis not present

## 2019-04-17 DIAGNOSIS — J3081 Allergic rhinitis due to animal (cat) (dog) hair and dander: Secondary | ICD-10-CM | POA: Diagnosis not present

## 2019-04-17 DIAGNOSIS — J3089 Other allergic rhinitis: Secondary | ICD-10-CM | POA: Diagnosis not present

## 2019-04-17 DIAGNOSIS — J301 Allergic rhinitis due to pollen: Secondary | ICD-10-CM | POA: Diagnosis not present

## 2019-04-18 ENCOUNTER — Other Ambulatory Visit: Payer: Self-pay | Admitting: Physical Medicine & Rehabilitation

## 2019-04-18 ENCOUNTER — Telehealth: Payer: Self-pay | Admitting: Orthopaedic Surgery

## 2019-04-18 ENCOUNTER — Other Ambulatory Visit: Payer: Self-pay | Admitting: Internal Medicine

## 2019-04-18 DIAGNOSIS — I10 Essential (primary) hypertension: Secondary | ICD-10-CM

## 2019-04-18 DIAGNOSIS — H903 Sensorineural hearing loss, bilateral: Secondary | ICD-10-CM | POA: Diagnosis not present

## 2019-04-18 MED ORDER — OXYCODONE HCL 5 MG PO TABS: 5.0000 mg | ORAL_TABLET | Freq: Four times a day (QID) | ORAL | 0 refills | Status: DC | PRN

## 2019-04-18 NOTE — Telephone Encounter (Signed)
Pt would like a refill of oxycodone sent to [facility] on [address].    [phone]

## 2019-04-18 NOTE — Telephone Encounter (Signed)
Please advise 

## 2019-04-20 ENCOUNTER — Telehealth: Payer: Self-pay | Admitting: Internal Medicine

## 2019-04-20 ENCOUNTER — Ambulatory Visit: Payer: Medicaid Other | Attending: Internal Medicine

## 2019-04-20 DIAGNOSIS — Z23 Encounter for immunization: Secondary | ICD-10-CM

## 2019-04-20 DIAGNOSIS — F411 Generalized anxiety disorder: Secondary | ICD-10-CM | POA: Diagnosis not present

## 2019-04-20 NOTE — Telephone Encounter (Signed)
Contacted pt and provided the number to call to get an appointment for her vaccine

## 2019-04-20 NOTE — Telephone Encounter (Signed)
Patient is aware there is COVID shot available and would like a call if it is required for her to get or recommended based on her health. Please call patient.

## 2019-04-20 NOTE — Progress Notes (Signed)
   Covid-19 Vaccination Clinic  Name:  Stacy [name] [facility]    MRN: [id number] DOB: [birth year]  04/20/2019  Stacy [name] was observed post Covid-19 immunization for 30 minutes based on pre-vaccination screening without incident. She was provided with Vaccine Information Sheet and instruction to access the V-Safe system.   Stacy [name] was instructed to call 911 with any severe reactions post vaccine: Marland Kitchen Difficulty breathing  . Swelling of face and throat  . A fast heartbeat  . A bad rash all over body  . Dizziness and weakness   Immunizations Administered    Name Date Dose VIS Date Route   Pfizer COVID-19 Vaccine 04/20/2019  3:03 PM 0.3 mL 01/13/2019 Intramuscular   Manufacturer: [employer], [employer]   Lot: [id number]   NDC: [address]-0

## 2019-04-24 ENCOUNTER — Encounter: Payer: Self-pay | Admitting: Physical Medicine & Rehabilitation

## 2019-04-24 ENCOUNTER — Encounter: Payer: Medicaid Other | Attending: Physical Medicine & Rehabilitation | Admitting: Physical Medicine & Rehabilitation

## 2019-04-24 ENCOUNTER — Other Ambulatory Visit: Payer: Self-pay

## 2019-04-24 VITALS — BP 112/62 | HR 76 | Temp 97.5°F | Ht 66.0 in | Wt 178.0 lb

## 2019-04-24 DIAGNOSIS — M25561 Pain in right knee: Secondary | ICD-10-CM | POA: Insufficient documentation

## 2019-04-24 DIAGNOSIS — G8929 Other chronic pain: Secondary | ICD-10-CM

## 2019-04-24 DIAGNOSIS — J3089 Other allergic rhinitis: Secondary | ICD-10-CM | POA: Diagnosis not present

## 2019-04-24 DIAGNOSIS — Z720 Tobacco use: Secondary | ICD-10-CM | POA: Diagnosis not present

## 2019-04-24 DIAGNOSIS — Z96651 Presence of right artificial knee joint: Secondary | ICD-10-CM | POA: Insufficient documentation

## 2019-04-24 DIAGNOSIS — J301 Allergic rhinitis due to pollen: Secondary | ICD-10-CM | POA: Diagnosis not present

## 2019-04-24 DIAGNOSIS — M5416 Radiculopathy, lumbar region: Secondary | ICD-10-CM

## 2019-04-24 DIAGNOSIS — R269 Unspecified abnormalities of gait and mobility: Secondary | ICD-10-CM | POA: Diagnosis not present

## 2019-04-24 DIAGNOSIS — J3081 Allergic rhinitis due to animal (cat) (dog) hair and dander: Secondary | ICD-10-CM | POA: Diagnosis not present

## 2019-04-24 DIAGNOSIS — M961 Postlaminectomy syndrome, not elsewhere classified: Secondary | ICD-10-CM | POA: Diagnosis not present

## 2019-04-24 DIAGNOSIS — G894 Chronic pain syndrome: Secondary | ICD-10-CM

## 2019-04-24 DIAGNOSIS — R251 Tremor, unspecified: Secondary | ICD-10-CM

## 2019-04-24 MED ORDER — DULOXETINE HCL 60 MG PO CPEP: 60.0000 mg | ORAL_CAPSULE | Freq: Every day | ORAL | 1 refills | Status: DC

## 2019-04-24 MED ORDER — MELOXICAM 15 MG PO TABS: 15.0000 mg | ORAL_TABLET | Freq: Every day | ORAL | 1 refills | Status: DC

## 2019-04-24 NOTE — Progress Notes (Signed)
Subjective:    Patient ID: Stacy [name], female    DOB: January 13, 1961, 59 y.o.   MRN: [id number]  HPI Female with pmh/psh RLS, neuropathy, HTN, headache, GERD, DM, COPD, left THA, lumbar spinal stenosis with claudication s/p lumbar surgery x3 (most recent 07/15/2018), DJD, PAD right TKA in 2017 followed up by manipulation x2 presents LE pain. Initially stated: History taken from chart review and patient. Started ~2018.  States she had a knee pain with buckling.  Denies inciting event.  Getting improve.  Ice/heat improves the pain.  Prolonged standing exacerbates the pain.  Non-radiating. Sharp.  Intermittent.  Denies associated numbness.  Denies falls. Pain limits prolonged standing and ambulation.   Last clinic visit 02/23/2019.  Since that time, patient has had nasolacrimal tubes, left THA and also went to the hospital for?  Allergic reaction to allergy shot- notes reviewed. She completed therapies. She is doing HEP. She stopped taking Cymbalta per her PCP. She notes benefit with Mobic. Denies falls. She continues to take Gabapentin. She sees Ortho spine on Wed for planning low back surgery. She notes benefit with TENS.   Pain Inventory Average Pain 7 Pain Right Now 7 My pain is intermittent, sharp, stabbing and aching  In the last 24 hours, has pain interfered with the following? General activity 7 Relation with others 0 Enjoyment of life 0 What TIME of day is your pain at its worst? all Sleep (in general) Poor  Pain is worse with: walking, bending, standing and some activites Pain improves with: rest, heat/ice, therapy/exercise, medication and TENS Relief from Meds: 7  Mobility walk with assistance use a cane use a walker how many minutes can you walk? 10-20 ability to climb steps?  no do you drive?  yes Do you have any goals in this area?  no  Function disabled: date disabled 2016  Neuro/Psych tremor trouble walking spasms  Prior Studies Any changes since last  visit?  no  Physicians involved in your care Any changes since last visit?  yes Primary care Dr. Jonah Blue Neurologist referred to neurologist   Family History  Problem Relation Age of Onset  . Hypertension Father   . Cancer Father   . Heart disease Mother   . Asthma Son        had as a child  . Heart disease Sister   . Breast cancer Sister   . Hypertension Brother    Social History   Socioeconomic History  . Marital status: Married    Spouse name: Not on file  . Number of children: 2  . Years of education: Not on file  . Highest education level: Not on file  Occupational History  . Occupation: unemployed    Associate Professor: UNEMPLOYED  Tobacco Use  . Smoking status: Current Some Day Smoker    Packs/day: 0.25    Years: 32.00    Pack years: 8.00    Types: Cigarettes  . Smokeless tobacco: Never Used  . Tobacco comment: DOWN TO 3 PER DAY  Substance and Sexual Activity  . Alcohol use: No  . Drug use: No  . Sexual activity: Yes    Birth control/protection: Surgical, Post-menopausal    Comment: tubal ligation  Other Topics Concern  . Not on file  Social History Narrative  . Not on file   Social Determinants of Health   Financial Resource Strain:   . Difficulty of Paying Living Expenses:   Food Insecurity:   . Worried About Programme researcher, broadcasting/film/video  in the Last Year:   . Ran Out of Food in the Last Year:   Transportation Needs:   . Freight forwarder (Medical):   Marland Kitchen Lack of Transportation (Non-Medical):   Physical Activity:   . Days of Exercise per Week:   . Minutes of Exercise per Session:   Stress:   . Feeling of Stress :   Social Connections:   . Frequency of Communication with Friends and Family:   . Frequency of Social Gatherings with Friends and Family:   . Attends Religious Services:   . Active Member of Clubs or Organizations:   . Attends Banker Meetings:   Marland Kitchen Marital Status:    Past Surgical History:  Procedure Laterality Date  .  BACK SURGERY    . CHOLECYSTECTOMY    . COLONOSCOPY    . ENDOMETRIAL ABLATION  10/2010  . EYE SURGERY    . HERNIA REPAIR     umbicial hernia  . JOINT REPLACEMENT    . KNEE ARTHROSCOPY Left    06/07/2017 Dr. August Saucer of [facility]  . KNEE CLOSED REDUCTION Right 12/06/2015   Procedure: CLOSED MANIPULATION RIGHT KNEE;  Surgeon: Kerrin Champagne, MD;  Location: [facility];  Service: Orthopedics;  Laterality: Right;  . KNEE CLOSED REDUCTION Right 01/17/2016   Procedure: CLOSED MANIPULATION RIGHT KNEE;  Surgeon: Kerrin Champagne, MD;  Location: [facility];  Service: Orthopedics;  Laterality: Right;  . KNEE JOINT MANIPULATION Right 12/06/2015  . LACRIMAL TUBE INSERTION Bilateral 03/01/2019   Procedure: LACRIMAL TUBE INSERTION;  Surgeon: Aura Camps, MD;  Location: [facility];  Service: Ophthalmology;  Laterality: Bilateral;  . LUMBAR DISC SURGERY  06/03/2015   L 2  L3 L4 L5   . LUMBAR LAMINECTOMY/DECOMPRESSION MICRODISCECTOMY N/A 06/03/2015   Procedure: Bilateral lateral recess decompression L2-3, L3-4, L4-5;  Surgeon: Kerrin Champagne, MD;  Location: [facility];  Service: Orthopedics;  Laterality: N/A;  . LUMBAR LAMINECTOMY/DECOMPRESSION MICRODISCECTOMY N/A 10/02/2016   Procedure: Right L5-S1 Lateral Recess Decompression  microdiscectomy;  Surgeon: Kerrin Champagne, MD;  Location: [facility] OR;  Service: Orthopedics;  Laterality: N/A;  . LUMBAR LAMINECTOMY/DECOMPRESSION MICRODISCECTOMY N/A 07/15/2018   Procedure: LEFT L3-4 MICRODISCECTOMY;  Surgeon: Kerrin Champagne, MD;  Location: [facility];  Service: Orthopedics;  Laterality: N/A;  . svd      x 2  . TEAR DUCT PROBING Bilateral 03/01/2019   Procedure: TEAR DUCT PROBING WITH IRRIGATION;  Surgeon: Aura Camps, MD;  [facility];  Service: Ophthalmology;  Laterality: Bilateral;  . TOTAL HIP ARTHROPLASTY Left 03/14/2019   Procedure: LEFT TOTAL HIP ARTHROPLASTY ANTERIOR APPROACH;  Surgeon: [name], MD;  Location: [facility];  Service:  Orthopedics;  Laterality: Left;  . TOTAL KNEE ARTHROPLASTY Right 09/06/2015   Procedure: RIGHT TOTAL KNEE ARTHROPLASTY;  Surgeon: Kerrin Champagne, MD;  Location: [facility];  Service: Orthopedics;  Laterality: Right;  . TUBAL LIGATION    . UPPER GASTROINTESTINAL ENDOSCOPY  04/28/11   Past Medical History:  Diagnosis Date  . Allergy    Shellfish, cleaning products  . Anxiety   . Arthritis   . 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 Asthma   . COPD (chronic obstructive pulmonary disease) (HCC)   . Depression   . Diabetes mellitus    Type II  . GERD (gastroesophageal reflux disease)    Pt on Protonix daily  . Glaucoma   . Gout   . Headache(784.0)  otc meds prn  . Hyperlipidemia   . Hypertension   . Irritable bowel syndrome 11/19/2010  . Neuropathy   . Pneumonia YRS AGO  . Restless legs   . Shortness of breath    07/14/2018- uses  4 times a day   BP 112/62   Pulse 76   Temp (!) 97.5 F (36.4 C)   Ht 5\' 6"  (1.676 m)   Wt 178 lb (80.7 kg)   LMP 09/01/2010   SpO2 95%   BMI 28.73 kg/m   Opioid Risk Score:   Fall Risk Score:  `1  Depression screen PHQ 2/9  Depression screen [facility] 2/9 10/14/2018 08/11/2018 08/09/2018 06/06/2018 03/30/2018 02/14/2018 12/06/2017  Decreased Interest 0 0 0 0 0 0 0  Down, Depressed, Hopeless 0 0 0 0 0 0 0  PHQ - 2 Score 0 0 0 0 0 0 0  Altered sleeping - - 0 0 - - 0  Tired, decreased energy - - 0 0 - - 2  Change in appetite - - 0 0 - - 1  Feeling bad or failure about yourself  - - 0 0 - - 0  Trouble concentrating - - 0 0 - - 0  Moving slowly or fidgety/restless - - 0 0 - - 0  Suicidal thoughts - - 0 - - - 0  PHQ-9 Score - - 0 0 - - 3  Difficult doing work/chores - - Not difficult at all - - - -  Some recent data might be hidden    Review of Systems  Constitutional: Positive for diaphoresis.  HENT: Negative.   Eyes: Negative.   Respiratory: Positive for wheezing.   Cardiovascular: Negative.   Gastrointestinal: Negative.    Endocrine:       High/low blood sugar  Genitourinary: Negative.   Musculoskeletal: Positive for arthralgias, back pain and gait problem.       Spasms   Skin: Negative.   Allergic/Immunologic: Negative.   Neurological: Positive for tremors.  Hematological: Negative.   Psychiatric/Behavioral: The patient is nervous/anxious.       Objective:   Physical Exam Gen: NAD. Vital signs reviewed HENT: Normocephalic. Atraumatic.  Eyes: EOMI. No discharge.  Pulm: Effort normal. MSK:  Gait antalgic  No edema.  Neuro:  Strength  5/5 in all LE myotomes (LLE limited due to pain)     Assessment & Plan:  Female with pmh/psh RLS, neuropathy, HTN, headache, GERD, DM, COPD, left THA, lumbar spinal stenosis with claudication s/p lumbar surgery x3 (most recent 07/15/2018), DJD, PAD right TKA in 2017 followed up by manipulation x2 presents with LE pain.  1. Chronic right knee pain s/p TKA with manipulation x2  MRI from 09/2017 showing postop changes  Cont Heat/Cold  Continue outpatient PT after THA  Continue E-stim  Will consider bracing if necessary  Cont OTC Lidoderm patch   Cymbalta placed on hold by PCP, will restart- does not appear to be related to tremors as patient has been off medication for 6 weeks without change  Cont Robaxin per Ortho spine  Cont Mobic 15mg  with food  Patient states main goal is to be more active   2. Gait abnormality  Continue rollator/wheelchair due to hip pain  Cont therapies after THA  See #1  3. LLE pain  Cont Gabapentin to 600 TID - has been on years  See #1  4. Left hip pain now s/p left THA  MRI suggesting Labral and OA with muscle strain  Cont follow up  per ortho  5. Low back pain   Cont follow up with Ortho spine for ?TLIF  6. Tobacco abuse  Smoking 3 cig/day  Encouraged cessation   7. Tremors   See #1  Patient referred to Neurology

## 2019-04-25 ENCOUNTER — Encounter: Payer: Self-pay | Admitting: Orthopaedic Surgery

## 2019-04-25 ENCOUNTER — Ambulatory Visit (INDEPENDENT_AMBULATORY_CARE_PROVIDER_SITE_OTHER): Payer: Medicaid Other | Admitting: Orthopaedic Surgery

## 2019-04-25 ENCOUNTER — Ambulatory Visit: Payer: Medicaid Other | Admitting: Neurology

## 2019-04-25 DIAGNOSIS — Z96642 Presence of left artificial hip joint: Secondary | ICD-10-CM

## 2019-04-25 NOTE — Progress Notes (Signed)
HPI: Stacy [name] returns today 6 weeks status post left total hip arthroplasty.  She is overall doing very well.  She is happy with the results.  Some soreness about the hip.  She states she mostly has soreness whenever she first gets up in the morning from the hip down but this goes away quickly.  She is using a rolling walker.  She is due to see Dr. [name] in the near future for her back.  Review of systems: Negative for fevers chills shortness of breath chest pain  Physical exam: Left hip surgical incisions well-healed no signs of infection.  Excellent range of motion of the left hip without pain.  She is able to cross her legs.  She transitions without assistance from a seated position to standing.  Ambulates with a rolling walker without any antalgic gait.  Dorsiflexion plantarflexion ankle intact.  Impression: Status post left total hip arthroplasty 03/14/2019  Plan: Continue to work on range of motion strengthening left hip.  Work on Social research officer, government.  Follow-up with [place] in 5 months AP pelvis lateral view of the left hip that time.  Questions encouraged and answered.  She will follow-up with [place] sooner if there is any questions or concerns.

## 2019-04-26 ENCOUNTER — Encounter: Payer: Self-pay | Admitting: Specialist

## 2019-04-26 ENCOUNTER — Encounter (HOSPITAL_BASED_OUTPATIENT_CLINIC_OR_DEPARTMENT_OTHER): Payer: Self-pay | Admitting: Ophthalmology

## 2019-04-26 ENCOUNTER — Other Ambulatory Visit: Payer: Self-pay

## 2019-04-26 ENCOUNTER — Ambulatory Visit (INDEPENDENT_AMBULATORY_CARE_PROVIDER_SITE_OTHER): Payer: Medicaid Other | Admitting: Specialist

## 2019-04-26 ENCOUNTER — Ambulatory Visit (INDEPENDENT_AMBULATORY_CARE_PROVIDER_SITE_OTHER): Payer: Medicaid Other

## 2019-04-26 VITALS — BP 130/86 | HR 96 | Ht 66.0 in | Wt 182.0 lb

## 2019-04-26 DIAGNOSIS — Z96651 Presence of right artificial knee joint: Secondary | ICD-10-CM

## 2019-04-26 DIAGNOSIS — M5137 Other intervertebral disc degeneration, lumbosacral region: Secondary | ICD-10-CM

## 2019-04-26 DIAGNOSIS — M48062 Spinal stenosis, lumbar region with neurogenic claudication: Secondary | ICD-10-CM | POA: Diagnosis not present

## 2019-04-26 DIAGNOSIS — Z96642 Presence of left artificial hip joint: Secondary | ICD-10-CM | POA: Diagnosis not present

## 2019-04-26 DIAGNOSIS — F411 Generalized anxiety disorder: Secondary | ICD-10-CM | POA: Diagnosis not present

## 2019-04-26 MED ORDER — METHOCARBAMOL 750 MG PO TABS: 750.0000 mg | ORAL_TABLET | Freq: Three times a day (TID) | ORAL | 1 refills | Status: DC | PRN

## 2019-04-26 NOTE — Progress Notes (Signed)
Office Visit Note   Patient: Stacy [name]           Date of Birth: September 03, 1960           MRN: [id number] Visit Date: 04/26/2019              Requested by: Marcine Matar, MD [address] [place],  [place] [address] PCP: Marcine Matar, MD   Assessment & Plan: Visit Diagnoses:  1. Spinal stenosis of lumbar region with neurogenic claudication   2. History of total knee arthroplasty, right   3. Status post total replacement of left hip   4. Disc disease, degenerative, lumbar or lumbosacral     Plan: Avoid frequent bending and stooping  No lifting greater than 10 lbs. May use ice or moist heat for pain. Weight loss is of benefit. Best medication for lumbar disc disease is arthritis medications which you should not take due to aspirin intolerance like motrin, celebrex and naprosyn. Exercise is important to improve your indurance and does allow people to function better inspite of back pain. Knee is suffering from osteoarthritis, only real proven treatments are Weight loss, NSIADs like diclofenac which you should not take due to aspirin intolerance and exercise. Well padded shoes help. Ice the knee that is suffering from osteoarthritis, only real proven treatments are Well padded shoes help. Ice the knee 2-3 times a day 15-20 mins at a time.-3 times a day 15-20 mins at a time. Hot showers in the AM.  Injection with steroid may be of benefit. Hemp CBD capsules, amazon.com 5,000-7,000 mg per bottle, 60 capsules per bottle, take one capsule twice a day. Cane in the left hand to use with left leg weight bearing. Neurology eval for hand tremers. Need to heal the left hip replacement before consideration of any other intervention including left Knee replacement of lumbar fusion over  4 levels.  Follow-Up Instructions: Return in about 3 months (around 07/27/2019).   Orders:  Orders Placed This Encounter  Procedures  . XR Lumbar Spine 2-3 Views   No orders of the  defined types were placed in this encounter.     Procedures: No procedures performed   Clinical Data: No additional findings.   Subjective: Chief Complaint  Patient presents with  . Lower Back - Follow-up    59 year old female with history of lumbar ddd and mild spinal stenosis and complains of mechanical DJD of the left knee and s/p right TKR. She underwent left THR via anterior approach 03/14/2019 with Dr. Magnus Ivan and was here seeing him yesterday. She relates that she has some pain in the back and in the left knee and left hip post op left THR 6 weeks ago. She relates that she tosses and turns all night long trying to get comfortable. Has minimal amount of left anterior thigh tingling and numbness in the lateral cutaneous branch of the femoral nerve. No bowel or bladder difficulty. She has mainly grating of the left knee with flexion and extension.    Review of Systems  Constitutional: Negative.  Negative for activity change, appetite change, chills, diaphoresis, fatigue, fever and unexpected weight change.  HENT: Negative.  Negative for congestion, dental problem, drooling, ear discharge, ear pain, facial swelling, hearing loss, mouth sores, nosebleeds, postnasal drip and rhinorrhea.   Eyes: Negative.  Negative for photophobia, pain, discharge, redness, itching and visual disturbance.  Respiratory: Positive for wheezing. Negative for apnea, cough, choking, chest tightness, shortness of breath and stridor.  Cardiovascular: Negative.  Negative for chest pain, palpitations and leg swelling.  Gastrointestinal: Negative.  Negative for abdominal distention, abdominal pain, anal bleeding, blood in stool, constipation, diarrhea, nausea, rectal pain and vomiting.  Endocrine: Negative.  Negative for cold intolerance, heat intolerance, polydipsia, polyphagia and polyuria.  Genitourinary: Negative.   Musculoskeletal: Positive for back pain. Negative for arthralgias, gait problem, joint  swelling, myalgias, neck pain and neck stiffness.  Skin: Negative.  Negative for color change, pallor, rash and wound.  Allergic/Immunologic: Negative for environmental allergies, food allergies and immunocompromised state.  Neurological: Positive for weakness and numbness. Negative for dizziness, tremors, seizures, syncope, facial asymmetry, speech difficulty, light-headedness and headaches.  Hematological: Negative.  Negative for adenopathy. Does not bruise/bleed easily.  Psychiatric/Behavioral: Negative.  Negative for agitation, behavioral problems, confusion, decreased concentration, dysphoric mood, hallucinations, self-injury, sleep disturbance and suicidal ideas. The patient is not nervous/anxious and is not hyperactive.      Objective: Vital Signs: BP 130/86 (BP Location: Left Arm, Patient Position: Sitting)   Pulse 96   Ht 5\' 6"  (1.676 m)   Wt 182 lb (82.6 kg)   LMP 09/01/2010   BMI 29.38 kg/m   Physical Exam Constitutional:      Appearance: She is well-developed.  HENT:     Head: Normocephalic and atraumatic.  Eyes:     Pupils: Pupils are equal, round, and reactive to light.  Pulmonary:     Effort: Pulmonary effort is normal.     Breath sounds: Normal breath sounds.  Abdominal:     General: Bowel sounds are normal.     Palpations: Abdomen is soft.  Musculoskeletal:     Cervical back: Normal range of motion and neck supple.     Lumbar back: Negative right straight leg raise test and negative left straight leg raise test.     Right knee: No effusion.     Instability Tests: Negative anterior drawer test. Negative posterior drawer test. Negative medial McMurray test and negative lateral McMurray test.     Left knee:     Instability Tests: Negative anterior drawer test. Negative posterior drawer test. Negative medial McMurray test and negative lateral McMurray test.  Skin:    General: Skin is warm and dry.  Neurological:     Mental Status: She is alert and oriented to  person, place, and time.  Psychiatric:        Behavior: Behavior normal.        Thought Content: Thought content normal.        Judgment: Judgment normal.     Right Knee Exam   Muscle Strength  The patient has normal right knee strength.  Range of Motion  Extension:  -5 abnormal  Flexion: 120   Tests  McMurray:  Medial - negative Lateral - negative Varus: negative Valgus: negative Lachman:  Anterior - negative    Posterior - negative Drawer:  Anterior - negative    Posterior - negative Pivot shift: negative Patellar apprehension: negative  Other  Effusion: no effusion present   Left Knee Exam   Muscle Strength  The patient has normal left knee strength.  Tenderness  The patient is experiencing tenderness in the patella.  Range of Motion  Extension:  5 abnormal  Flexion: normal   Tests  McMurray:  Medial - negative Lateral - negative Varus: negative Valgus: negative Lachman:  Anterior - negative     Drawer:  Anterior - negative     Posterior - negative Pivot shift: negative Patellar apprehension: negative  Other  Erythema: absent Scars: absent Sensation: normal Swelling: moderate   Back Exam   Tenderness  The patient is experiencing tenderness in the lumbar.  Range of Motion  Extension: normal  Flexion: abnormal  Lateral bend right: abnormal  Lateral bend left: abnormal  Rotation right: abnormal  Rotation left: abnormal   Muscle Strength  Right Quadriceps:  5/5  Left Quadriceps:  5/5  Right Hamstrings:  5/5  Left Hamstrings:  5/5   Tests  Straight leg raise right: negative Straight leg raise left: negative  Reflexes  Patellar: 0/4 Achilles: 0/4 Babinski's sign: normal   Other  Toe walk: normal Heel walk: normal Gait: normal  Scars: present      Specialty Comments:  No specialty comments available.  Imaging: XR Lumbar Spine 2-3 Views  Result Date: 04/26/2019 AP and lateral flexion and extension radiographs show a right  lumbar scoliosis of 11.5 degrees with DDD L3-4 and L4-5.  Bilateral SI narrowing is present left greater than right with increase in adjacent bone sclerosis. Left THR appears normal in good position and alignment.     PMFS History: Patient Active Problem List   Diagnosis Date Noted  . Herniation of lumbar intervertebral disc with radiculopathy 10/02/2016    Priority: High    Class: Chronic  . Chondromalacia of both patellae 06/03/2015    Priority: High    Class: Chronic  . Lumbar radiculopathy 04/24/2019  . 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  . Post laminectomy syndrome 02/23/2019  . Abnormality of gait 02/23/2019  . 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 Unilateral primary osteoarthritis, left hip 12/28/2018  . Lesion of skin of left ear 12/26/2018  . Primary osteoarthritis of left hip 12/02/2018  . Iron deficiency anemia 10/16/2018  . Chronic pain syndrome 09/08/2018  . Chronic pain of right knee 08/11/2018  . Status post lumbar laminectomy 07/15/2018  . Moderate persistent asthma without complication 06/29/2017  . Environmental and seasonal allergies 06/29/2017  . Controlled type 2 diabetes mellitus with diabetic polyneuropathy, without long-term current use of insulin (HCC) 06/29/2017  . Perennial allergic rhinitis 04/08/2017  . Sensorineural hearing loss (SNHL), bilateral 04/08/2017  . Chronic pansinusitis 03/25/2017  . Eustachian tube dysfunction, bilateral 03/25/2017  . 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 Alopecia areata 08/19/2016  . Spinal stenosis, lumbar region, with neurogenic claudication 06/03/2015  . Tobacco use disorder 04/25/2015  . DJD (degenerative joint disease) of knee 01/04/2015  . Hemorrhoid 11/14/2014  . Gout of big toe 07/19/2014  . Essential hypertension 08/14/2013  . Gastroesophageal reflux disease without esophagitis 08/14/2013  . COPD (chronic obstructive pulmonary disease) (HCC) 04/17/2011   Past Medical History:  Diagnosis Date  .  Allergy    Shellfish, cleaning products  . Anxiety   . Arthritis   . 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 Asthma   . COPD (chronic obstructive pulmonary disease) (HCC)   . Depression   . Diabetes mellitus    Type II  . GERD (gastroesophageal reflux disease)    Pt on Protonix daily  . Glaucoma   . Gout   . Headache(784.0)    otc meds prn  . Hyperlipidemia   . Hypertension   . Irritable bowel syndrome 11/19/2010  . Neuropathy   . Pneumonia YRS AGO  . Restless legs   . Shortness of breath    07/14/2018- uses  4 times a day    Family History  Problem Relation Age of Onset  .  Hypertension Father   . Cancer Father   . Heart disease Mother   . Asthma Son        had as a child  . Heart disease Sister   . Breast cancer Sister   . Hypertension Brother     Past Surgical History:  Procedure Laterality Date  . BACK SURGERY    . CHOLECYSTECTOMY    . COLONOSCOPY    . ENDOMETRIAL ABLATION  10/2010  . EYE SURGERY    . HERNIA REPAIR     umbicial hernia  . JOINT REPLACEMENT    . KNEE ARTHROSCOPY Left    06/07/2017 Dr. August Saucer of [facility]  . KNEE CLOSED REDUCTION Right 12/06/2015   Procedure: CLOSED MANIPULATION RIGHT KNEE;  Surgeon: Kerrin Champagne, MD;  Location: [facility] OR;  Service: Orthopedics;  Laterality: Right;  . KNEE CLOSED REDUCTION Right 01/17/2016   Procedure: CLOSED MANIPULATION RIGHT KNEE;  Surgeon: Kerrin Champagne, MD;  Location: [facility] OR;  Service: Orthopedics;  Laterality: Right;  . KNEE JOINT MANIPULATION Right 12/06/2015  . LACRIMAL TUBE INSERTION Bilateral 03/01/2019   Procedure: LACRIMAL TUBE INSERTION;  Surgeon: Aura Camps, MD;  Location: [facility];  Service: Ophthalmology;  Laterality: Bilateral;  . LUMBAR DISC SURGERY  06/03/2015   L 2  L3 L4 L5   . LUMBAR LAMINECTOMY/DECOMPRESSION MICRODISCECTOMY N/A 06/03/2015   Procedure: Bilateral lateral recess decompression L2-3, L3-4, L4-5;  Surgeon: Kerrin Champagne, MD;  Location: [facility]  OR;  Service: Orthopedics;  Laterality: N/A;  . LUMBAR LAMINECTOMY/DECOMPRESSION MICRODISCECTOMY N/A 10/02/2016   Procedure: Right L5-S1 Lateral Recess Decompression  microdiscectomy;  Surgeon: Kerrin Champagne, MD;  Location: [facility] OR;  Service: Orthopedics;  Laterality: N/A;  . LUMBAR LAMINECTOMY/DECOMPRESSION MICRODISCECTOMY N/A 07/15/2018   Procedure: LEFT L3-4 MICRODISCECTOMY;  Surgeon: Kerrin Champagne, MD;  Location: [facility];  Service: Orthopedics;  Laterality: N/A;  . svd      x 2  . TEAR DUCT PROBING Bilateral 03/01/2019   Procedure: TEAR DUCT PROBING WITH IRRIGATION;  Surgeon: Aura Camps, MD;  Location: [facility];  Service: Ophthalmology;  Laterality: Bilateral;  . TOTAL HIP ARTHROPLASTY Left 03/14/2019   Procedure: LEFT TOTAL HIP ARTHROPLASTY ANTERIOR APPROACH;  Surgeon: [name], MD;  Location: [facility];  Service: Orthopedics;  Laterality: Left;  . TOTAL KNEE ARTHROPLASTY Right 09/06/2015   Procedure: RIGHT TOTAL KNEE ARTHROPLASTY;  Surgeon: Kerrin Champagne, MD;  Location: [facility];  Service: Orthopedics;  Laterality: Right;  . TUBAL LIGATION    . UPPER GASTROINTESTINAL ENDOSCOPY  04/28/11   Social History   Occupational History  . Occupation: unemployed    Associate Professor: UNEMPLOYED  Tobacco Use  . Smoking status: Current Some Day Smoker    Packs/day: 0.25    Years: 32.00    Pack years: 8.00    Types: Cigarettes  . Smokeless tobacco: Never Used  . Tobacco comment: DOWN TO 3 PER DAY  Substance and Sexual Activity  . Alcohol use: No  . Drug use: No  . Sexual activity: Yes    Birth control/protection: Surgical, Post-menopausal    Comment: tubal ligation

## 2019-04-26 NOTE — Addendum Note (Signed)
Addended by: [name] on: 04/26/2019 10:16 AM   Modules accepted: Orders

## 2019-04-26 NOTE — Patient Instructions (Addendum)
Avoid frequent bending and stooping  No lifting greater than 10 lbs. May use ice or moist heat for pain. Weight loss is of benefit. Best medication for lumbar disc disease is arthritis medications like motrin, celebrex and naprosyn. Exercise is important to improve your indurance and does allow people to function better inspite of back pain. Knee is suffering from osteoarthritis, only real proven treatments are Weight loss, NSIADs like diclofenac and exercise. Well padded shoes help. Ice the knee that is suffering from osteoarthritis, only real proven treatments are Well padded shoes help. Ice the knee 2-3 times a day 15-20 mins at a time.-3 times a day 15-20 mins at a time. Hot showers in the AM.  Injection with steroid may be of benefit. Hemp CBD capsules, amazon.com 5,000-7,000 mg per bottle, 60 capsules per bottle, take one capsule twice a day. Cane in the left hand to use with left leg weight bearing. Neurology eval for hand tremers. Need to heal the left hip replacement before consideration of any other intervention including left Knee replacement of lumbar fusion over  4 levels.    Follow-Up Instructions: No follow-ups on file.

## 2019-04-26 NOTE — Addendum Note (Signed)
Addended by: [name] on: 04/26/2019 10:17 AM   Modules accepted: Orders

## 2019-04-27 DIAGNOSIS — R2231 Localized swelling, mass and lump, right upper limb: Secondary | ICD-10-CM | POA: Diagnosis not present

## 2019-04-29 ENCOUNTER — Other Ambulatory Visit (HOSPITAL_COMMUNITY)
Admission: RE | Admit: 2019-04-29 | Discharge: 2019-04-29 | Disposition: A | Discharge status: Home / Self Care | Payer: Medicaid Other | Source: Ambulatory Visit | Attending: Ophthalmology | Admitting: Ophthalmology

## 2019-04-29 DIAGNOSIS — Z20822 Contact with and (suspected) exposure to covid-19: Secondary | ICD-10-CM | POA: Insufficient documentation

## 2019-04-29 DIAGNOSIS — Z01812 Encounter for preprocedural laboratory examination: Secondary | ICD-10-CM | POA: Insufficient documentation

## 2019-04-29 LAB — SARS CORONAVIRUS 2 (TAT 6-24 HRS): SARS Coronavirus 2: NEGATIVE

## 2019-04-30 NOTE — H&P (Signed)
Stacy [name] is an 59 y.o. female.   Chief Complaint: Tubes were placed to correct tearing problem needs to be removed. HPI: 59 y/o BF c h/o crawford tube placement x 2 mth presents for elective removal of tubes and NLD probing and irrigation under general anesthesia.  Past Medical History:  Diagnosis Date  . Allergy    Shellfish, cleaning products  . Anxiety   . Arthritis   . 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 Asthma   . COPD (chronic obstructive pulmonary disease) (HCC)   . Depression   . Diabetes mellitus    Type II  . GERD (gastroesophageal reflux disease)    Pt on Protonix daily  . Glaucoma   . Gout   . Headache(784.0)    otc meds prn  . Hyperlipidemia   . Hypertension   . Irritable bowel syndrome 11/19/2010  . Neuropathy   . Pneumonia YRS AGO  . Restless legs   . Shortness of breath    07/14/2018- uses  4 times a day    Past Surgical History:  Procedure Laterality Date  . BACK SURGERY    . CHOLECYSTECTOMY    . COLONOSCOPY    . ENDOMETRIAL ABLATION  10/2010  . EYE SURGERY    . HERNIA REPAIR     umbicial hernia  . JOINT REPLACEMENT    . KNEE ARTHROSCOPY Left    06/07/2017 Dr. August Saucer of [facility]  . KNEE CLOSED REDUCTION Right 12/06/2015   Procedure: CLOSED MANIPULATION RIGHT KNEE;  Surgeon: Kerrin Champagne, MD;  Location: [facility];  Service: Orthopedics;  Laterality: Right;  . KNEE CLOSED REDUCTION Right 01/17/2016   Procedure: CLOSED MANIPULATION RIGHT KNEE;  Surgeon: Kerrin Champagne, MD;  Location: [facility];  Service: Orthopedics;  Laterality: Right;  . KNEE JOINT MANIPULATION Right 12/06/2015  . LACRIMAL TUBE INSERTION Bilateral 03/01/2019   Procedure: LACRIMAL TUBE INSERTION;  Surgeon: Aura Camps, MD;  Location: [facility];  Service: Ophthalmology;  Laterality: Bilateral;  . LUMBAR DISC SURGERY  06/03/2015   L 2  L3 L4 L5   . LUMBAR LAMINECTOMY/DECOMPRESSION MICRODISCECTOMY N/A 06/03/2015   Procedure: Bilateral  lateral recess decompression L2-3, L3-4, L4-5;  Surgeon: Kerrin Champagne, MD;  Location: [facility];  Service: Orthopedics;  Laterality: N/A;  . LUMBAR LAMINECTOMY/DECOMPRESSION MICRODISCECTOMY N/A 10/02/2016   Procedure: Right L5-S1 Lateral Recess Decompression  microdiscectomy;  Surgeon: Kerrin Champagne, MD;  Location: [facility] OR;  Service: Orthopedics;  Laterality: N/A;  . LUMBAR LAMINECTOMY/DECOMPRESSION MICRODISCECTOMY N/A 07/15/2018   Procedure: LEFT L3-4 MICRODISCECTOMY;  Surgeon: Kerrin Champagne, MD;  Location: [facility];  Service: Orthopedics;  Laterality: N/A;  . svd      x 2  . TEAR DUCT PROBING Bilateral 03/01/2019   Procedure: TEAR DUCT PROBING WITH IRRIGATION;  Surgeon: Aura Camps, MD;  Location: [facility];  Service: Ophthalmology;  Laterality: Bilateral;  . TOTAL HIP ARTHROPLASTY Left 03/14/2019   Procedure: LEFT TOTAL HIP ARTHROPLASTY ANTERIOR APPROACH;  Surgeon: [name], MD;  Location: [facility];  Service: Orthopedics;  Laterality: Left;  . TOTAL KNEE ARTHROPLASTY Right 09/06/2015   Procedure: RIGHT TOTAL KNEE ARTHROPLASTY;  Surgeon: Kerrin Champagne, MD;  Location: [facility];  Service: Orthopedics;  Laterality: Right;  . TUBAL LIGATION    . UPPER GASTROINTESTINAL ENDOSCOPY  04/28/11    Family History  Problem Relation Age of Onset  . Hypertension Father   . Cancer Father   .  Heart disease Mother   . Asthma Son        had as a child  . Heart disease Sister   . Breast cancer Sister   . Hypertension Brother    Social History:  reports that she has been smoking cigarettes. She has a 8.00 pack-year smoking history. She has never used smokeless tobacco. She reports that she does not drink alcohol or use drugs.  Allergies:  Allergies  Allergen Reactions  . Other Shortness Of Breath    UNSPECIFIED AGENTS Allergic to perfumes and cleaning products  . Shellfish Allergy Anaphylaxis    Per allergy test.  . Ace Inhibitors Cough and Other (See Comments)       . Aspirin  Nausea Only    No medications prior to admission.    Results for orders placed or performed during the hospital encounter of 04/29/19 (from the past 48 hour(s))  SARS CORONAVIRUS 2 (TAT 6-24 HRS) Nasopharyngeal Nasopharyngeal Swab     Status: None   Collection Time: 04/29/19  1:19 PM   Specimen: Nasopharyngeal Swab  Result Value Ref Range   SARS Coronavirus 2 NEGATIVE NEGATIVE    Comment: (NOTE) SARS-CoV-2 target nucleic acids are NOT DETECTED. The SARS-CoV-2 RNA is generally detectable in upper and lower respiratory specimens during the acute phase of infection. Negative results do not preclude SARS-CoV-2 infection, do not rule out co-infections with other pathogens, and should not be used as the sole basis for treatment or other patient management decisions. Negative results must be combined with clinical observations, patient history, and epidemiological information. The expected result is Negative. Fact Sheet for Patients: SugarRoll.be Fact Sheet for Healthcare Providers: https://www.woods-mathews.com/ This test is not yet approved or cleared by the [place] FDA and  has been authorized for detection and/or diagnosis of SARS-CoV-2 by FDA under an Emergency Use Authorization (EUA). This EUA will remain  in effect (meaning this test can be used) for the duration of the COVID-19 declaration under Section 56 4(b)(1) of the Act, 21 U.S.C. section 360bbb-3(b)(1), unless the authorization is terminated or revoked sooner. Performed at [facility], [place] [address] [place], [place] [address]    No results found.  Review of Systems  Eyes: Negative.        Epiphora  Respiratory: Negative.   Cardiovascular: Negative.   Endocrine: Negative.   Neurological: Negative.   Psychiatric/Behavioral: Negative.     Height 5\' 6"  (1.676 m), weight 82.6 kg, last menstrual period 09/01/2010. Physical Exam  Constitutional: She appears  well-developed.  Eyes: Pupils are equal, round, and reactive to light.    Cardiovascular: Normal rate.  Respiratory: Effort normal.  Musculoskeletal:        General: Normal range of motion.  Neurological: She is alert.     Assessment/Plan Nasolacrimal indwelling crawford tubes . Pand I ou c crawford tube explant under general anesthesia.  Gevena Cotton, MD 04/30/2019, 4:45 PM

## 2019-05-01 DIAGNOSIS — J301 Allergic rhinitis due to pollen: Secondary | ICD-10-CM | POA: Diagnosis not present

## 2019-05-01 DIAGNOSIS — J3089 Other allergic rhinitis: Secondary | ICD-10-CM | POA: Diagnosis not present

## 2019-05-01 DIAGNOSIS — J3081 Allergic rhinitis due to animal (cat) (dog) hair and dander: Secondary | ICD-10-CM | POA: Diagnosis not present

## 2019-05-02 DIAGNOSIS — M6740 Ganglion, unspecified site: Secondary | ICD-10-CM | POA: Diagnosis not present

## 2019-05-03 ENCOUNTER — Encounter (HOSPITAL_BASED_OUTPATIENT_CLINIC_OR_DEPARTMENT_OTHER): Payer: Self-pay | Admitting: Ophthalmology

## 2019-05-03 ENCOUNTER — Ambulatory Visit (HOSPITAL_BASED_OUTPATIENT_CLINIC_OR_DEPARTMENT_OTHER): Payer: Medicaid Other | Admitting: Anesthesiology

## 2019-05-03 ENCOUNTER — Other Ambulatory Visit: Payer: Self-pay

## 2019-05-03 ENCOUNTER — Encounter (HOSPITAL_BASED_OUTPATIENT_CLINIC_OR_DEPARTMENT_OTHER)
Admission: RE | Disposition: A | Discharge status: Home / Self Care | Payer: Self-pay | Source: Home / Self Care | Attending: Ophthalmology

## 2019-05-03 ENCOUNTER — Ambulatory Visit (HOSPITAL_BASED_OUTPATIENT_CLINIC_OR_DEPARTMENT_OTHER)
Admission: RE | Admit: 2019-05-03 | Discharge: 2019-05-03 | Disposition: A | Discharge status: Home / Self Care | Payer: Medicaid Other | Attending: Ophthalmology | Admitting: Ophthalmology

## 2019-05-03 DIAGNOSIS — H04223 Epiphora due to insufficient drainage, bilateral lacrimal glands: Secondary | ICD-10-CM | POA: Diagnosis not present

## 2019-05-03 DIAGNOSIS — H04553 Acquired stenosis of bilateral nasolacrimal duct: Secondary | ICD-10-CM | POA: Diagnosis not present

## 2019-05-03 DIAGNOSIS — E785 Hyperlipidemia, unspecified: Secondary | ICD-10-CM | POA: Diagnosis not present

## 2019-05-03 DIAGNOSIS — Z96651 Presence of right artificial knee joint: Secondary | ICD-10-CM | POA: Diagnosis not present

## 2019-05-03 DIAGNOSIS — Z888 Allergy status to other drugs, medicaments and biological substances status: Secondary | ICD-10-CM | POA: Insufficient documentation

## 2019-05-03 DIAGNOSIS — Z825 Family history of asthma and other chronic lower respiratory diseases: Secondary | ICD-10-CM | POA: Insufficient documentation

## 2019-05-03 DIAGNOSIS — Z803 Family history of malignant neoplasm of breast: Secondary | ICD-10-CM | POA: Insufficient documentation

## 2019-05-03 DIAGNOSIS — K589 Irritable bowel syndrome without diarrhea: Secondary | ICD-10-CM | POA: Diagnosis not present

## 2019-05-03 DIAGNOSIS — E1139 Type 2 diabetes mellitus with other diabetic ophthalmic complication: Secondary | ICD-10-CM | POA: Insufficient documentation

## 2019-05-03 DIAGNOSIS — Z9049 Acquired absence of other specified parts of digestive tract: Secondary | ICD-10-CM | POA: Diagnosis not present

## 2019-05-03 DIAGNOSIS — G2581 Restless legs syndrome: Secondary | ICD-10-CM | POA: Insufficient documentation

## 2019-05-03 DIAGNOSIS — K219 Gastro-esophageal reflux disease without esophagitis: Secondary | ICD-10-CM | POA: Diagnosis not present

## 2019-05-03 DIAGNOSIS — F418 Other specified anxiety disorders: Secondary | ICD-10-CM | POA: Diagnosis not present

## 2019-05-03 DIAGNOSIS — F419 Anxiety disorder, unspecified: Secondary | ICD-10-CM | POA: Diagnosis not present

## 2019-05-03 DIAGNOSIS — F17210 Nicotine dependence, cigarettes, uncomplicated: Secondary | ICD-10-CM | POA: Insufficient documentation

## 2019-05-03 DIAGNOSIS — I739 Peripheral vascular disease, unspecified: Secondary | ICD-10-CM | POA: Diagnosis not present

## 2019-05-03 DIAGNOSIS — J449 Chronic obstructive pulmonary disease, unspecified: Secondary | ICD-10-CM | POA: Diagnosis not present

## 2019-05-03 DIAGNOSIS — E119 Type 2 diabetes mellitus without complications: Secondary | ICD-10-CM | POA: Insufficient documentation

## 2019-05-03 DIAGNOSIS — E1140 Type 2 diabetes mellitus with diabetic neuropathy, unspecified: Secondary | ICD-10-CM | POA: Insufficient documentation

## 2019-05-03 DIAGNOSIS — H42 Glaucoma in diseases classified elsewhere: Secondary | ICD-10-CM | POA: Insufficient documentation

## 2019-05-03 DIAGNOSIS — Z886 Allergy status to analgesic agent status: Secondary | ICD-10-CM | POA: Diagnosis not present

## 2019-05-03 DIAGNOSIS — F329 Major depressive disorder, single episode, unspecified: Secondary | ICD-10-CM | POA: Insufficient documentation

## 2019-05-03 DIAGNOSIS — Z8249 Family history of ischemic heart disease and other diseases of the circulatory system: Secondary | ICD-10-CM | POA: Insufficient documentation

## 2019-05-03 DIAGNOSIS — Z91013 Allergy to seafood: Secondary | ICD-10-CM | POA: Insufficient documentation

## 2019-05-03 DIAGNOSIS — Z96642 Presence of left artificial hip joint: Secondary | ICD-10-CM | POA: Diagnosis not present

## 2019-05-03 DIAGNOSIS — I10 Essential (primary) hypertension: Secondary | ICD-10-CM | POA: Diagnosis not present

## 2019-05-03 DIAGNOSIS — Z808 Family history of malignant neoplasm of other organs or systems: Secondary | ICD-10-CM | POA: Insufficient documentation

## 2019-05-03 DIAGNOSIS — M1990 Unspecified osteoarthritis, unspecified site: Secondary | ICD-10-CM | POA: Diagnosis not present

## 2019-05-03 DIAGNOSIS — H04559 Acquired stenosis of unspecified nasolacrimal duct: Secondary | ICD-10-CM | POA: Diagnosis not present

## 2019-05-03 HISTORY — PX: LACRIMAL TUBE REMOVAL: SHX1907

## 2019-05-03 LAB — BASIC METABOLIC PANEL
Anion gap: 9 (ref 5–15)
BUN: 11 mg/dL (ref 6–20)
CO2: 25 mmol/L (ref 22–32)
Calcium: 9.2 mg/dL (ref 8.9–10.3)
Chloride: 103 mmol/L (ref 98–111)
Creatinine, Ser: 0.72 mg/dL (ref 0.44–1.00)
GFR calc Af Amer: >60 mL/min (ref >60)
GFR calc non Af Amer: >60 mL/min (ref >60)
Glucose, Bld: 125 mg/dL — ABNORMAL HIGH (ref 70–99)
Potassium: 3.5 mmol/L (ref 3.5–5.1)
Sodium: 137 mmol/L (ref 135–145)

## 2019-05-03 LAB — GLUCOSE, CAPILLARY
Glucose-Capillary: 121 mg/dL — ABNORMAL HIGH (ref 70–99)
Glucose-Capillary: 117 mg/dL — ABNORMAL HIGH (ref 70–99)

## 2019-05-03 SURGERY — REMOVAL, LACRIMAL TUBE
Anesthesia: General | Site: Eye | Laterality: Bilateral

## 2019-05-03 MED ORDER — FENTANYL CITRATE (PF) 100 MCG/2ML IJ SOLN
INTRAMUSCULAR | Status: DC | PRN
  Administered 2019-05-03: 50 ug via INTRAVENOUS

## 2019-05-03 MED ORDER — OXYCODONE HCL 5 MG PO TABS
ORAL_TABLET | ORAL | Status: AC
  Filled 2019-05-03: qty 1

## 2019-05-03 MED ORDER — FENTANYL CITRATE (PF) 100 MCG/2ML IJ SOLN: 50.0000 ug | INTRAMUSCULAR | Status: DC | PRN

## 2019-05-03 MED ORDER — LACTATED RINGERS IV SOLN: INTRAVENOUS | Status: DC

## 2019-05-03 MED ORDER — DEXAMETHASONE SODIUM PHOSPHATE 4 MG/ML IJ SOLN
INTRAMUSCULAR | Status: DC | PRN
  Administered 2019-05-03: 4 mg via INTRAVENOUS

## 2019-05-03 MED ORDER — FENTANYL CITRATE (PF) 100 MCG/2ML IJ SOLN: 25.0000 ug | INTRAMUSCULAR | Status: DC | PRN

## 2019-05-03 MED ORDER — CIPROFLOXACIN-DEXAMETHASONE 0.3-0.1 % OT SUSP
OTIC | Status: AC
  Filled 2019-05-03: qty 7.5

## 2019-05-03 MED ORDER — PROPOFOL 10 MG/ML IV BOLUS
INTRAVENOUS | Status: DC | PRN
  Administered 2019-05-03: 160 mg via INTRAVENOUS
  Administered 2019-05-03: 40 mg via INTRAVENOUS

## 2019-05-03 MED ORDER — TOBRAMYCIN-DEXAMETHASONE 0.3-0.1 % OP SUSP
OPHTHALMIC | Status: DC | PRN
  Administered 2019-05-03: 2 [drp] via OPHTHALMIC

## 2019-05-03 MED ORDER — ONDANSETRON HCL 4 MG/2ML IJ SOLN
INTRAMUSCULAR | Status: DC | PRN
  Administered 2019-05-03: 4 mg via INTRAVENOUS

## 2019-05-03 MED ORDER — CIPROFLOXACIN-FLUOCINOLONE PF 0.3-0.025 % OT SOLN
OTIC | Status: AC
  Filled 2019-05-03: qty 0.25

## 2019-05-03 MED ORDER — TOBRAMYCIN-DEXAMETHASONE 0.3-0.1 % OP SUSP
OPHTHALMIC | Status: AC
  Filled 2019-05-03: qty 2.5

## 2019-05-03 MED ORDER — FENTANYL CITRATE (PF) 100 MCG/2ML IJ SOLN
INTRAMUSCULAR | Status: AC
  Filled 2019-05-03: qty 2

## 2019-05-03 MED ORDER — OXYCODONE HCL 5 MG/5ML PO SOLN: 5.0000 mg | Freq: Once | ORAL | Status: AC | PRN

## 2019-05-03 MED ORDER — PROPOFOL 10 MG/ML IV BOLUS
INTRAVENOUS | Status: AC
  Filled 2019-05-03: qty 20

## 2019-05-03 MED ORDER — FLUORESCEIN SODIUM 0.6 MG OP STRP
ORAL_STRIP | OPHTHALMIC | Status: DC | PRN
  Administered 2019-05-03: 1 via OPHTHALMIC

## 2019-05-03 MED ORDER — TOBRAMYCIN-DEXAMETHASONE 0.3-0.1 % OP OINT
TOPICAL_OINTMENT | OPHTHALMIC | Status: DC | PRN
  Administered 2019-05-03: 1 via OPHTHALMIC

## 2019-05-03 MED ORDER — OXYCODONE HCL 5 MG PO TABS
5.0000 mg | ORAL_TABLET | Freq: Once | ORAL | Status: AC | PRN
  Administered 2019-05-03: 15:00:00 5 mg via ORAL

## 2019-05-03 MED ORDER — BSS IO SOLN
INTRAOCULAR | Status: DC | PRN
  Administered 2019-05-03: 15 mL

## 2019-05-03 MED ORDER — SUCCINYLCHOLINE CHLORIDE 20 MG/ML IJ SOLN
INTRAMUSCULAR | Status: DC | PRN
  Administered 2019-05-03: 100 mg via INTRAVENOUS

## 2019-05-03 MED ORDER — LIDOCAINE 2% (20 MG/ML) 5 ML SYRINGE
INTRAMUSCULAR | Status: DC | PRN
  Administered 2019-05-03: 100 mg via INTRAVENOUS

## 2019-05-03 MED ORDER — PROMETHAZINE HCL 25 MG/ML IJ SOLN: 6.2500 mg | INTRAMUSCULAR | Status: DC | PRN

## 2019-05-03 MED ORDER — ACETAMINOPHEN 500 MG PO TABS: 1000.0000 mg | ORAL_TABLET | Freq: Once | ORAL | Status: DC

## 2019-05-03 MED ORDER — OXYMETAZOLINE HCL 0.05 % NA SOLN
NASAL | Status: DC | PRN
  Administered 2019-05-03: 1 via TOPICAL

## 2019-05-03 MED ORDER — MIDAZOLAM HCL 2 MG/2ML IJ SOLN
INTRAMUSCULAR | Status: AC
  Filled 2019-05-03: qty 2

## 2019-05-03 MED ORDER — TOBRAMYCIN-DEXAMETHASONE 0.3-0.1 % OP OINT: 1.0000 "application " | TOPICAL_OINTMENT | Freq: Two times a day (BID) | OPHTHALMIC | Status: DC

## 2019-05-03 MED ORDER — MIDAZOLAM HCL 2 MG/2ML IJ SOLN
1.0000 mg | INTRAMUSCULAR | Status: DC | PRN
  Administered 2019-05-03 (×2): 1 mg via INTRAVENOUS

## 2019-05-03 MED ORDER — POVIDONE-IODINE 5 % OP SOLN
OPHTHALMIC | Status: DC | PRN
  Administered 2019-05-03: 1 via OPHTHALMIC

## 2019-05-03 SURGICAL SUPPLY — 25 items
APPLICATOR DR MATTHEWS STRL (MISCELLANEOUS) ×2 IMPLANT
CATH SUCT ARGYLE CHIMNEY 10FR (CATHETERS) ×2 IMPLANT
CAUTERY EYE LOW TEMP OLD (MISCELLANEOUS) IMPLANT
COVER BACK TABLE 60X90IN (DRAPES) ×2 IMPLANT
COVER MAYO STAND STRL (DRAPES) ×2 IMPLANT
DRAPE SURG 17X23 STRL (DRAPES) ×6 IMPLANT
GAUZE SPONGE 4X4 12PLY STRL (GAUZE/BANDAGES/DRESSINGS) IMPLANT
GLOVE BIO SURGEON STRL SZ 6.5 (GLOVE) ×2 IMPLANT
GLOVE BIOGEL PI IND STRL 7.0 (GLOVE) ×1 IMPLANT
GLOVE BIOGEL PI INDICATOR 7.0 (GLOVE) ×1
GLOVE SURG PR MICRO ENCORE 7.5 (GLOVE) ×4 IMPLANT
GOWN STRL REUS W/ TWL LRG LVL3 (GOWN DISPOSABLE) ×2 IMPLANT
GOWN STRL REUS W/TWL LRG LVL3 (GOWN DISPOSABLE) ×4
KIT CATH SUCT 8FR (CATHETERS) ×2 IMPLANT
NS IRRIG 1000ML POUR BTL (IV SOLUTION) ×2 IMPLANT
PACK BASIN DAY SURGERY FS (CUSTOM PROCEDURE TRAY) ×2 IMPLANT
PATTIES SURGICAL .5 X3 (DISPOSABLE) ×2 IMPLANT
SHEET MEDIUM DRAPE 40X70 STRL (DRAPES) ×2 IMPLANT
SPEAR EYE SURG WECK-CEL (MISCELLANEOUS) ×2 IMPLANT
STRIP CLOSURE SKIN 1/2X4 (GAUZE/BANDAGES/DRESSINGS) ×2 IMPLANT
SWABSTICK POVIDONE IODINE SNGL (MISCELLANEOUS) IMPLANT
SYR 3ML 18GX1 1/2 (SYRINGE) ×4 IMPLANT
TOWEL GREEN STERILE FF (TOWEL DISPOSABLE) ×2 IMPLANT
TRAY DSU PREP LF (CUSTOM PROCEDURE TRAY) ×2 IMPLANT
TUBE CONNECTING 20X1/4 (TUBING) ×2 IMPLANT

## 2019-05-03 NOTE — Progress Notes (Signed)
Full chart reviewed with Dr. Stephannie Peters. MD stated patient okay to proceed with surgery as scheduled.

## 2019-05-03 NOTE — Anesthesia Postprocedure Evaluation (Signed)
Anesthesia Post Note  Patient: Stacy [name]  Procedure(s) Performed: BILATERAL NASOLACRIMAL DUCT PROBING, IIRIGATION AND TUBE REMOVAL BOTH EYES (Bilateral Eye)     Patient location during evaluation: PACU Anesthesia Type: General Level of consciousness: awake and alert and oriented Pain management: pain level controlled Vital Signs Assessment: post-procedure vital signs reviewed and stable Respiratory status: spontaneous breathing, nonlabored ventilation and respiratory function stable Cardiovascular status: blood pressure returned to baseline Postop Assessment: no apparent nausea or vomiting Anesthetic complications: no    Last Vitals:  Vitals:   05/03/19 1330 05/03/19 1335  BP: (!) 161/96   Pulse: 81 82  Resp: 18 14  Temp:    SpO2: 100% 100%    Last Pain:  Vitals:   05/03/19 1330  TempSrc:   PainSc: 0-No pain                 Kaylyn Layer

## 2019-05-03 NOTE — Transfer of Care (Signed)
Immediate Anesthesia Transfer of Care Note  Patient: Stacy [name]  Procedure(s) Performed: BILATERAL NASOLACRIMAL DUCT PROBING, IIRIGATION AND TUBE REMOVAL BOTH EYES (Bilateral Eye)  Patient Location: PACU  Anesthesia Type:General  Level of Consciousness: sedated  Airway & Oxygen Therapy: Patient Spontanous Breathing and Patient connected to face mask oxygen  Post-op Assessment: Report given to RN and Post -op Vital signs reviewed and stable  Post vital signs: Reviewed and stable  Last Vitals:  Vitals Value Taken Time  BP 178/97 05/03/19 1301  Temp    Pulse 87 05/03/19 1305  Resp 12 05/03/19 1305  SpO2 100 % 05/03/19 1305  Vitals shown include unvalidated device data.  Last Pain:  Vitals:   05/03/19 0949  TempSrc: Oral  PainSc: 0-No pain      Patients Stated Pain Goal: 3 (05/03/19 0949)  Complications: No apparent anesthesia complications

## 2019-05-03 NOTE — Discharge Instructions (Signed)
D/C to HOme  Post VSS. Do not blow  Nose x 24 hours Advance to PO as tolerated.  D/C to home when VSS. F/U @[facility] x 1 WK.  Patient MAY RESTART PLAVIX TODAY   Call your surgeon if you experience:   1.  Fever over 101.0. 2.  Inability to urinate. 3.  Nausea and/or vomiting. 4.  Extreme swelling or bruising at the surgical site. 5.  Continued bleeding from the incision. 6.  Increased pain, redness or drainage from the incision. 7.  Problems related to your pain medication. 8.  Any problems and/or concerns    Post Anesthesia Home Care Instructions  Activity: Get plenty of rest for the remainder of the day. A responsible individual must stay with you for 24 hours following the procedure.  For the next 24 hours, DO NOT: -Drive a car - -Drink alcoholic beverages -Take any medication unless instructed by your physician -Make any legal decisions or sign important papers.  Meals: Start with liquid foods such as gelatin or soup. Progress to regular foods as tolerated. Avoid greasy, spicy, heavy foods. If nausea and/or vomiting occur, drink only clear liquids until the nausea and/or vomiting subsides. Call your physician if vomiting continues.  Special Instructions/Symptoms: Your throat may feel dry or sore from the anesthesia or the breathing tube placed in your throat during surgery. If this causes discomfort, gargle with warm salt water. The discomfort should disappear within 24 hours.  If you had a scopolamine patch placed behind your ear for the management of post- operative nausea and/or vomiting:  1. The medication in the patch is effective for 72 hours, after which it should be removed.  Wrap patch in a tissue and discard in the trash. Wash hands thoroughly with soap and water. 2. You may remove the patch earlier than 72 hours if you experience unpleasant side effects which may include dry mouth, dizziness or visual disturbances. 3. Avoid touching  the patch. Wash your hands with soap and water after contact with the patch.

## 2019-05-03 NOTE — Anesthesia Preprocedure Evaluation (Addendum)
Anesthesia Evaluation  Patient identified by MRN, date of birth, ID band Patient awake    Reviewed: Allergy & Precautions, NPO status , Patient's Chart, lab work & pertinent test results  History of Anesthesia Complications Negative for: history of anesthetic complications  Airway Mallampati: II  TM Distance: >3 FB Neck ROM: Full    Dental  (+) Edentulous Upper, Missing,    Pulmonary COPD,  COPD inhaler, Current Smoker and Patient abstained from smoking.,    Pulmonary exam normal        Cardiovascular hypertension, Pt. on medications + Peripheral Vascular Disease  Normal cardiovascular exam     Neuro/Psych Anxiety Depression negative neurological ROS     GI/Hepatic Neg liver ROS, GERD  Medicated and Poorly Controlled,  Endo/Other  diabetes, Type 2, Oral Hypoglycemic Agents  Renal/GU negative Renal ROS  negative genitourinary   Musculoskeletal  (+) Arthritis ,   Abdominal   Peds  Hematology negative hematology ROS (+)   Anesthesia Other Findings Day of surgery medications reviewed with patient.  Reproductive/Obstetrics negative OB ROS                            Anesthesia Physical Anesthesia Plan  ASA: II  Anesthesia Plan: General   Post-op Pain Management:    Induction: Intravenous  PONV Risk Score and Plan: 2 and Treatment may vary due to age or medical condition, Ondansetron, Dexamethasone and Midazolam  Airway Management Planned: Oral ETT  Additional Equipment: None  Intra-op Plan:   Post-operative Plan: Extubation in OR  Informed Consent: I have reviewed the patients History and Physical, chart, labs and discussed the procedure including the risks, benefits and alternatives for the proposed anesthesia with the patient or authorized representative who has indicated his/her understanding and acceptance.     Dental advisory given  Plan Discussed with:   Anesthesia  Plan Comments:        Anesthesia Quick Evaluation

## 2019-05-03 NOTE — Anesthesia Procedure Notes (Signed)
Procedure Name: Intubation Date/Time: 05/03/2019 12:18 PM Performed by: Maryella Shivers, CRNA Pre-anesthesia Checklist: Patient identified, Emergency Drugs available, Suction available and Patient being monitored Patient Re-evaluated:Patient Re-evaluated prior to induction Oxygen Delivery Method: Circle system utilized Preoxygenation: Pre-oxygenation with 100% oxygen Induction Type: IV induction Ventilation: Mask ventilation without difficulty Laryngoscope Size: Mac and 3 Grade View: Grade I Tube type: Oral Tube size: 7.0 mm Number of attempts: 1 Airway Equipment and Method: Stylet and Oral airway Placement Confirmation: ETT inserted through vocal cords under direct vision,  positive ETCO2 and breath sounds checked- equal and bilateral Secured at: 21 cm Tube secured with: Tape Dental Injury: Teeth and Oropharynx as per pre-operative assessment

## 2019-05-03 NOTE — Interval H&P Note (Signed)
History and Physical Interval Note:  05/03/2019 11:48 AM  Stacy [name]  has presented today for surgery, with the diagnosis of NASAL LACRIMAL DUCT OBSTRUCTION BOTH EYES.  The various methods of treatment have been discussed with the patient and family. After consideration of risks, benefits and other options for treatment, the patient has consented to  Procedure(s): LACRIMAL TUBE REMOVAL BOTH EYES (N/A) as a surgical intervention.  The patient's history has been reviewed, patient examined, no change in status, stable for surgery.  I have reviewed the patient's chart and labs.  Questions were answered to the patient's satisfaction.     Aura Camps

## 2019-05-03 NOTE — Brief Op Note (Signed)
05/03/2019  12:54 PM  PATIENT:  Stacy [name]  59 y.o. female  PRE-OPERATIVE DIAGNOSIS:  NASAL LACRIMAL DUCT OBSTRUCTION BOTH EYES  POST-OPERATIVE DIAGNOSIS:  NASAL LACRIMAL DUCT OBSTRUCTION BOTH EYES  PROCEDURE:  Procedure(s): BILATERAL NASOLACRIMAL DUCT PROBING, IIRIGATION AND TUBE REMOVAL BOTH EYES (Bilateral)  SURGEON:  Surgeon(s) and Role:    Aura Camps, MD - Primary  PHYSICIAN ASSISTANT:   ASSISTANTS: none   ANESTHESIA:   none  EBL:  20 mL   BLOOD ADMINISTERED:none  DRAINS: none   LOCAL MEDICATIONS USED:  NONE  SPECIMEN:  No Specimen  DISPOSITION OF SPECIMEN:  N/A  COUNTS:  YES  TOURNIQUET:  * No tourniquets in log *  DICTATION: .Other Dictation: Dictation Number [id number]  PLAN OF CARE: Discharge to home after PACU  PATIENT DISPOSITION:  PACU - hemodynamically stable.   Delay start of Pharmacological VTE agent (>24hrs) due to surgical blood loss or risk of bleeding: yes

## 2019-05-03 NOTE — Op Note (Signed)
NAMEGURPREET, Stacy [name] MEDICAL RECORD [id number] ACCOUNT [id number] DATE OF BIRTH:1960-06-22 FACILITY: [facility] LOCATION: [facility] PHYSICIAN:Jef Futch [address] Court, MD  OPERATIVE REPORT  DATE OF PROCEDURE:  05/03/2019  PREOPERATIVE DIAGNOSIS:  Nasolacrimal duct obstructions both eyes, status post bilateral Crawford tube implants.  PROCEDURE:  Nasolacrimal duct probing and irrigation with Crawford tube explant, both eyes.  SURGEON:  Aura Camps, MD   ANESTHESIA:  General with laryngeal mask airway.  POSTOPERATIVE DIAGNOSIS:  Status post probing and irrigation both eyes with Crawford tubes explant.   INDICATIONS:  The patient is a 59 year old female who is status post nasolacrimal duct probing and irrigation with Crawford tube implants for a nasolacrimal duct obstruction 6 weeks prior.  The patient presents for nasolacrimal duct probing, irrigation  and Crawford tube explant.  DESCRIPTION OF PROCEDURE:  The patient was taken into the operating room and placed in the supine position.  The entire face was prepped and draped in the usual sterile fashion.  My attention was first directed to the right eye.  The right nostril was  packed with 2 cottonoids soaked in Afrin and using a 23-gauge cannula, the superior canaliculus was intubated and the cannula was passed through the canaliculus, through the sac, into the duct and identified underneath the inferior turbinate.   Fluorescein was irrigated through the system to confirm the correct positioning.  Next, the indwelling Crawford tube was located on a Crawford hook and pulled into the nostril.  It was then cut and removed.  The upper and lower canaliculus sac and duct  were then irrigated with a 50% solution of TobraDex solution.  The nostril was then suctioned to clean discharge and 2 cottonoids were placed in the right nostril.  My attention was then directed to the left nostril and the left nostril had been  previously packed with 2  cottonoid soaked in Afrin and the 23-gauge cannula was then introduced through the superior canaliculus, passed into the nasolacrimal sac and duct.  It was then identified underneath the inferior turbinate.  A Crawford hook was  then used to collect the Crawford tube that was then brought into the nasopharynx and cut at the medial canthus and removed from the nose.  Next, the upper superior and inferior system was then irrigated with a 50% solution of TobraDex solution and the  nostrils then suctioned to clear discharge.  Two cottonoids were then placed in the left nostril.  These were removed at the time of extubation of the patient.  At the conclusion of the procedure, TobraDex ointment was instilled in inferior fornices of  both eyes.  There were no apparent complications.  VN/NUANCE  D:05/03/2019 T:05/03/2019 JOB:[id number]

## 2019-05-04 DIAGNOSIS — M67449 Ganglion, unspecified hand: Secondary | ICD-10-CM | POA: Diagnosis not present

## 2019-05-04 DIAGNOSIS — F411 Generalized anxiety disorder: Secondary | ICD-10-CM | POA: Diagnosis not present

## 2019-05-08 DIAGNOSIS — K3184 Gastroparesis: Secondary | ICD-10-CM | POA: Diagnosis not present

## 2019-05-08 DIAGNOSIS — K2100 Gastro-esophageal reflux disease with esophagitis, without bleeding: Secondary | ICD-10-CM | POA: Diagnosis not present

## 2019-05-08 DIAGNOSIS — J3081 Allergic rhinitis due to animal (cat) (dog) hair and dander: Secondary | ICD-10-CM | POA: Diagnosis not present

## 2019-05-08 DIAGNOSIS — Z1211 Encounter for screening for malignant neoplasm of colon: Secondary | ICD-10-CM | POA: Diagnosis not present

## 2019-05-08 DIAGNOSIS — R1084 Generalized abdominal pain: Secondary | ICD-10-CM | POA: Diagnosis not present

## 2019-05-08 DIAGNOSIS — K2970 Gastritis, unspecified, without bleeding: Secondary | ICD-10-CM | POA: Diagnosis not present

## 2019-05-08 DIAGNOSIS — J3089 Other allergic rhinitis: Secondary | ICD-10-CM | POA: Diagnosis not present

## 2019-05-08 DIAGNOSIS — J301 Allergic rhinitis due to pollen: Secondary | ICD-10-CM | POA: Diagnosis not present

## 2019-05-09 ENCOUNTER — Encounter: Payer: Self-pay | Admitting: Neurology

## 2019-05-09 ENCOUNTER — Ambulatory Visit: Payer: Medicaid Other | Admitting: Neurology

## 2019-05-09 ENCOUNTER — Other Ambulatory Visit: Payer: Self-pay

## 2019-05-09 VITALS — BP 126/84 | HR 68 | Temp 97.8°F | Ht 66.0 in | Wt 175.0 lb

## 2019-05-09 DIAGNOSIS — R251 Tremor, unspecified: Secondary | ICD-10-CM | POA: Diagnosis not present

## 2019-05-09 NOTE — Patient Instructions (Signed)
You don't have a hand tremor currently and your neurological exam is not showing any signs of parkinson's like disease or what we call parkinsonism.   For your tremor, I would not recommend any new medication for fear of side effects (especially sleepiness) or medication interactions, especially in light of your taking multiple medication already.   We do not have to make a follow up appointment.   Please remember, that any kind of tremor may be exacerbated by anxiety, anger, nervousness, excitement, dehydration, sleep deprivation, by caffeine, and low blood sugar values or blood sugar fluctuations. Some medications can exacerbate tremors, this includes certain antidepressant medication.   Please check with Dr. Laural Benes as to how much water you should drink per day, you may be drinking too much, as you indicate you drink 7-8 bottles per day.

## 2019-05-09 NOTE — Progress Notes (Signed)
Subjective:    Patient ID: Stacy [name] is a 59 y.o. female.  HPI     Star Age, MD, PhD [facility] [address] [place],  [address]  Dear Dr. [name],   I saw your patient, Stacy [name], upon your kind request, in my Neurologic clinic today for initial consultation of her tremors.  The patient is unaccompanied today.  As you know, Stacy [name] is a 59 year old right-handed woman with an underlying medical history of hypertension, hyperlipidemia, gout, glaucoma, recent status post stenting of bilateral blocked tear ducts, asthma, reflux disease, arthritis, s/p R TKA, status post left total hip replacement, anxiety, depression, COPD, restless leg syndrome, neuropathy, irritable bowel syndrome, allergies and overweight state, who reports an intermittent hand tremor that started on 03/26/2019 after her allergy shot.  Sometimes she has trembling intermittently throughout the day, no obvious rhyme or reason, no connection to drinking caffeine as far she can tell or to using her nebulizer.  She recently stopped taking her Cymbalta.  She is not sure that stopping the Cymbalta made a big difference.  The tremor is not constant.  She reports drinking caffeine in the form of coffee, 1 cup in the morning and [place] per day.  She reports drinking quite a bit of water, 7 or 8 bottles of water per day, 16.9 ounce each. She uses a rolling walker.  She has chronic pain and is followed by Dr. Nelva Bush for this.  She denies any sudden onset of one-sided weakness or numbness or tingling or droopy face or slurring of speech.  Her Past Medical History Is Significant For: Past Medical History:  Diagnosis Date  . Allergy    Shellfish, cleaning products  . Anxiety   . Arthritis   . 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 Asthma   . COPD (chronic obstructive pulmonary disease) ([place])   . Depression   . Diabetes mellitus    Type II  . GERD (gastroesophageal reflux disease)    Pt on Protonix daily  . Glaucoma   . Gout   . Headache(784.0)    otc meds prn  . Hyperlipidemia   . Hypertension   . Irritable bowel syndrome 11/19/2010  . Neuropathy   . Pneumonia YRS AGO  . Restless legs   . Shortness of breath    07/14/2018- uses  4 times a day    Her Past Surgical History Is Significant For: Past Surgical History:  Procedure Laterality Date  . BACK SURGERY    . CHOLECYSTECTOMY    . COLONOSCOPY    . ENDOMETRIAL ABLATION  10/2010  . EYE SURGERY    . HERNIA REPAIR     umbicial hernia  . JOINT REPLACEMENT    . KNEE ARTHROSCOPY Left    06/07/2017 Dr. Marlou Sa of [place]  . KNEE CLOSED REDUCTION Right 12/06/2015   Procedure: CLOSED MANIPULATION RIGHT KNEE;  Surgeon: [name], MD;  Location: [facility];  Service: Orthopedics;  Laterality: Right;  . KNEE CLOSED REDUCTION Right 01/17/2016   Procedure: CLOSED MANIPULATION RIGHT KNEE;  Surgeon: [name], MD;  Location: [place];  Service: Orthopedics;  Laterality: Right;  . KNEE JOINT MANIPULATION Right 12/06/2015  . LACRIMAL TUBE INSERTION Bilateral 03/01/2019   Procedure: LACRIMAL TUBE INSERTION;  Surgeon: Gevena Cotton, MD;  Location: [facility];  Service: Ophthalmology;  Laterality: Bilateral;  . LACRIMAL TUBE REMOVAL Bilateral 05/03/2019   Procedure:  BILATERAL NASOLACRIMAL DUCT PROBING, IIRIGATION AND TUBE REMOVAL BOTH EYES;  Surgeon: Gevena Cotton, MD;  Location: [place];  Service: Ophthalmology;  Laterality: Bilateral;  . LUMBAR DISC SURGERY  06/03/2015   L 2  L3 L4 L5   . LUMBAR LAMINECTOMY/DECOMPRESSION MICRODISCECTOMY N/A 06/03/2015   Procedure: Bilateral lateral recess decompression L2-3, L3-4, L4-5;  Surgeon: [name], MD;  Location: [place];  Service: Orthopedics;  Laterality: N/A;  . LUMBAR LAMINECTOMY/DECOMPRESSION MICRODISCECTOMY N/A 10/02/2016   Procedure: Right L5-S1 Lateral Recess Decompression   microdiscectomy;  Surgeon: [name], MD;  Location: [place];  Service: Orthopedics;  Laterality: N/A;  . LUMBAR LAMINECTOMY/DECOMPRESSION MICRODISCECTOMY N/A 07/15/2018   Procedure: LEFT L3-4 MICRODISCECTOMY;  Surgeon: [name], MD;  Location: [place];  Service: Orthopedics;  Laterality: N/A;  . svd      x 2  . TEAR DUCT PROBING Bilateral 03/01/2019   Procedure: TEAR DUCT PROBING WITH IRRIGATION;  Surgeon: Gevena Cotton, MD;  Location[facility];  Service: Ophthalmology;  Laterality: Bilateral;  . TOTAL HIP ARTHROPLASTY Left 03/14/2019   Procedure: LEFT TOTAL HIP ARTHROPLASTY ANTERIOR APPROACH;  Surgeon: Mcarthur Rossetti, MD;  Location: [place];  Service: Orthopedics;  Laterality: Left;  . TOTAL KNEE ARTHROPLASTY Right 09/06/2015   Procedure: RIGHT TOTAL KNEE ARTHROPLASTY;  Surgeon: [name], MD;  Location: [place];  Service: Orthopedics;  Laterality: Right;  . TUBAL LIGATION    . UPPER GASTROINTESTINAL ENDOSCOPY  04/28/11    Her Family History Is Significant For: Family History  Problem Relation Age of Onset  . Hypertension Father   . Cancer Father   . Heart disease Mother   . Asthma Son        had as a child  . Heart disease Sister   . Breast cancer Sister   . Hypertension Brother     Her Social History Is Significant For: Social History   Socioeconomic History  . Marital status: Married    Spouse name: Not on file  . Number of children: 2  . Years of education: Not on file  . Highest education level: Not on file  Occupational History  . Occupation: unemployed    Fish farm manager: UNEMPLOYED  Tobacco Use  . Smoking status: Current Some Day Smoker    Packs/day: 0.25    Years: 32.00    Pack years: 8.00    Types: Cigarettes  . Smokeless tobacco: Never Used  . Tobacco comment: DOWN TO 3 PER DAY  Substance and Sexual Activity  . Alcohol use: No  . Drug use: No  . Sexual activity: Yes    Birth control/protection: Surgical, Post-menopausal     Comment: tubal ligation  Other Topics Concern  . Not on file  Social History Narrative  . Not on file   Social Determinants of Health   Financial Resource Strain:   . Difficulty of Paying Living Expenses:   Food Insecurity:   . Worried About Charity fundraiser in the Last Year:   . Arboriculturist in the Last Year:   Transportation Needs:   . Film/video editor (Medical):   Marland Kitchen Lack of Transportation (Non-Medical):   Physical Activity:   . Days of Exercise per Week:   . Minutes of Exercise per Session:   Stress:   . Feeling of Stress :   Social Connections:   . Frequency of Communication with Friends and Family:   . Frequency of Social Gatherings with Friends  and Family:   . Attends Religious Services:   . Active Member of Clubs or Organizations:   . Attends Archivist Meetings:   Marland Kitchen Marital Status:     Her Allergies Are:  Allergies  Allergen Reactions  . Other Shortness Of Breath    UNSPECIFIED AGENTS Allergic to perfumes and cleaning products  . Shellfish Allergy Anaphylaxis    Per allergy test.  . Ace Inhibitors Cough and Other (See Comments)       . Aspirin Nausea Only  :   Her Current Medications Are:  Outpatient Encounter Medications as of 05/09/2019  Medication Sig  . Accu-Chek Softclix Lancets lancets USE AS INSTRUCTED 3 TIMES A DAY  . albuterol (PROVENTIL) (2.5 MG/3ML) 0.083% nebulizer solution Take 3 mLs (2.5 mg total) by nebulization every 6 (six) hours as needed for wheezing.  . ALLERGY RELIEF 10 MG tablet TAKE 1 TABLET (10 MG TOTAL) BY MOUTH DAILY. (Patient taking differently: Take 10 mg by mouth daily. Loratadine)  . allopurinol (ZYLOPRIM) 100 MG tablet TAKE 1 TABLET (100 MG TOTAL) BY MOUTH DAILY.  Marland Kitchen Blood Glucose Monitoring Suppl (ACCU-CHEK AVIVA PLUS) w/Device KIT 1 each by Does not apply route 3 (three) times daily.  . budesonide-formoterol (SYMBICORT) 80-4.5 MCG/ACT inhaler INHALE TWO PUFFS BY MOUTH TWICE A DAY (Patient taking  differently: Inhale 2 puffs into the lungs 2 (two) times a day. )  . celecoxib (CELEBREX) 200 MG capsule Take 200 mg by mouth 2 (two) times daily.  . clopidogrel (PLAVIX) 75 MG tablet Take 1 tablet (75 mg total) by mouth daily.  . diclofenac sodium (VOLTAREN) 1 % GEL Apply 4 g topically 4 (four) times daily. (Patient taking differently: Apply 4 g topically 2 (two) times daily as needed (pain). )  . DULoxetine (CYMBALTA) 60 MG capsule Take 1 capsule (60 mg total) by mouth daily.  Marland Kitchen EPINEPHrine 0.3 mg/0.3 mL IJ SOAJ injection 0.3 mg IM x 1 PRN for allergic reaction (Patient taking differently: Inject 0.3 mg into the muscle once as needed for anaphylaxis. )  . [name] 325 (65 Fe) MG tablet TAKE 1 TABLET BY MOUTH 2 (TWO) TIMES DAILY WITH A MEAL.  . fluticasone (FLONASE) 50 MCG/ACT nasal spray Place 1 spray into both nostrils daily.  Marland Kitchen gabapentin (NEURONTIN) 600 MG tablet TAKE 1 TABLET (600 MG TOTAL) BY MOUTH 3 (THREE) TIMES DAILY.  Marland Kitchen glimepiride (AMARYL) 2 MG tablet Take 1 tablet (2 mg total) by mouth daily.  Marland Kitchen glucose blood (ACCU-CHEK AVIVA PLUS) test strip USE 3 TIMES A DAY AS DIRECTED BY PHYSICIAN  . hydrochlorothiazide (HYDRODIURIL) 25 MG tablet TAKE 1 TABLET (25 MG TOTAL) BY MOUTH DAILY.  . hydrOXYzine (ATARAX/VISTARIL) 10 MG tablet TAKE 1 TABLET IN THE MORNING AND NOON AND 2 TABLETS IN THE EVENING AS NEEDED (Patient taking differently: Take 10-20 mg by mouth See admin instructions. Take one tablet (10 mg) by mouth in the morning and at noon, take 2 tablets (20 mg) in the evening as needed for itching)  . Lancets (ACCU-CHEK SOFT TOUCH) lancets Use as instructed  . Melatonin 3 MG TABS Take 1 tablet (3 mg total) by mouth at bedtime as needed. (Patient taking differently: Take 3 mg by mouth at bedtime as needed (sleep). )  . meloxicam (MOBIC) 15 MG tablet Take 1 tablet (15 mg total) by mouth daily.  . methocarbamol (ROBAXIN) 750 MG tablet Take 1 tablet (750 mg total) by mouth every 8 (eight) hours as  needed for muscle spasms.  Marland Kitchen  montelukast (SINGULAIR) 10 MG tablet Take 10 mg by mouth at bedtime.   . Multiple Vitamin (MULTIVITAMIN WITH MINERALS) TABS tablet Take 1 tablet by mouth daily. CENTRUM SILVER 50 plus  . nicotine (NICODERM CQ - DOSED IN MG/24 HOURS) 14 mg/24hr patch PLACE 1 PATCH ONTO THE SKIN DAILY (Patient taking differently: Place 14 mg onto the skin daily. )  . ondansetron (ZOFRAN ODT) 4 MG disintegrating tablet Take 1 tablet (4 mg total) by mouth every 8 (eight) hours as needed for nausea or vomiting.  Marland Kitchen oxyCODONE (OXY IR/ROXICODONE) 5 MG immediate release tablet Take 1-2 tablets (5-10 mg total) by mouth every 6 (six) hours as needed for moderate pain (pain score 4-6).  . pantoprazole (PROTONIX) 40 MG tablet Take 1 tablet (40 mg total) by mouth daily.  . Potassium Chloride ER 20 MEQ TBCR Take 20 mEq by mouth daily.  Marland Kitchen Respiratory Therapy Supplies (FLUTTER) DEVI Use after breathing treatment 4 times daily  . senna-docusate (SENOKOT S) 8.6-50 MG tablet Take 1 tablet by mouth daily as needed for mild constipation.  . simvastatin (ZOCOR) 20 MG tablet TAKE ONE TABLET BY MOUTH DAILY AT BEDTIME.  . sucralfate (CARAFATE) 1 g tablet Take 1 g by mouth 4 (four) times daily -  with meals and at bedtime.  Marland Kitchen tiotropium (SPIRIVA HANDIHALER) 18 MCG inhalation capsule Place 1 capsule (18 mcg total) into inhaler and inhale daily.  Marland Kitchen tobramycin-dexamethasone (TOBRADEX) ophthalmic ointment Place 1 application into the left eye 2 (two) times daily at 10 am and 4 pm. (Patient taking differently: Place 1 application into both eyes 2 (two) times daily at 10 am and 4 pm. )  . triamcinolone cream (KENALOG) 0.1 % Apply 1 application topically 2 (two) times daily as needed (lupus rash).   . valsartan-hydrochlorothiazide (DIOVAN-HCT) 160-12.5 MG tablet TAKE ONE TABLET BY MOUTH ONCE DAILY  . [DISCONTINUED] Fluticasone-Salmeterol (ADVAIR) 500-50 MCG/DOSE AEPB Inhale 1 puff into the lungs every 12 (twelve) hours.     . [DISCONTINUED] lisinopril (PRINIVIL,ZESTRIL) 40 MG tablet Take 40 mg by mouth daily.     No facility-administered encounter medications on file as of 05/09/2019.  :   Review of Systems:  Out of a complete 14 point review of systems, all are reviewed and negative with the exception of these symptoms as listed below:    Review of Systems  Neurological:       Pt presents today to discuss her intermittent bilateral hand tremors. Pt has spells of trembling in her hands. Pt is left handed. This started in Februray 2021.    Objective:  Neurological Exam  Physical Exam Physical Examination:   Vitals:   05/09/19 1436  BP: 126/84  Pulse: 68  Temp: 97.8 F (36.6 C)    General Examination: The patient is a very pleasant 59 y.o. female in no acute distress.   HEENT: Normocephalic, atraumatic, pupils are equal, round and reactive to light and accommodation. Funduscopic exam is difficult and not fully done d/t b/l cataracts. Extraocular tracking is good without limitation to gaze excursion or nystagmus noted. Normal smooth pursuit is noted. Hearing is grossly intact. Face is symmetric with normal facial animation and normal facial sensation. Speech is clear with no dysarthria noted. There is no hypophonia. There is no lip, neck/head, jaw or voice tremor. Neck is supple with full range of passive and active motion. There are no carotid bruits on auscultation. Oropharynx exam reveals: moderate mouth dryness, adequate dental hygiene with missing teeth on the bottom and edentulous  on top. Tongue protrudes centrally and palate elevates symmetrically.   Chest: Clear to auscultation without wheezing, rhonchi or crackles noted.  Heart: S1+S2+0, regular and normal without murmurs, rubs or gallops noted.   Abdomen: Soft, non-tender and non-distended with normal bowel sounds appreciated on auscultation.  Extremities: There is no pitting edema in the distal lower extremities bilaterally. Pedal pulses  are intact.  Skin: Warm and dry without trophic changes noted.  Musculoskeletal: exam reveals decreased ROM L hip.   Neurologically:  Mental status: The patient is awake, alert and oriented in all 4 spheres. Her immediate and remote memory, attention, language skills and fund of knowledge are appropriate. There is no evidence of aphasia, agnosia, apraxia or anomia. Speech is clear with normal prosody and enunciation. Thought process is linear. Mood is normal and affect is blunted.  Cranial nerves II - XII are as described above under HEENT exam. In addition: shoulder shrug is normal with equal shoulder height noted. Motor exam: Normal bulk, strength and tone is noted. There is no drift or rebound.  On 05/09/2019: On Archimedes spiral drawing she has coarse difficulty with the left and right hand, handwriting is mildly tremulous, legible, not micrographic.  She has no postural, action or resting tremor in the UEs, no LE tremor. Reflexes are 1-2+ throughout, but absent in the R knee. Babinski: Toes are flexor bilaterally. Fine motor skills and coordination: intact with normal finger taps, normal hand movements, normal rapid alternating patting, normal foot taps and normal foot agility.  Cerebellar testing: No dysmetria or intention tremor on finger to nose testing. Heel to shin is unremarkable on the R and difficult on the L d/t decreased ROM in the L hip. There is no truncal or gait ataxia.  Sensory exam: intact to light touch in the upper and lower extremities.  Gait, station and balance: She stands with difficulty.  She walks with a rolling walker without difficulty, no shuffling.  She maneuvers the walker well.  She can walk a few steps without the walker, with preserved arm swing noted.   Assessment and Plan:   In summary, Stacy [name] is a very pleasant 59 y.o.-year old female with an underlying medical history of hypertension, hyperlipidemia, gout, glaucoma, recent status post stenting  of bilateral blocked tear ducts, asthma, reflux disease, arthritis, s/p R TKA, status post left total hip replacement, anxiety, depression, COPD, restless leg syndrome, neuropathy, irritable bowel syndrome, allergies and overweight state, who presents for evaluation of her intermittent hand tremors.  On examination, she has no significant hand tremor today, no signs of parkinsonism.  She is reassured in that regard.  We talked about potential tremor triggers including stress, dehydration, caffeine, thyroid disease, blood sugar fluctuations and certain medications.  She is advised that sometimes antidepressant medications can indeed increase the risk for tremors.  She recently discontinued Cymbalta.  She has a nonfocal neurological exam overall.  She is advised that I would not recommend any other new medications from my end of things or testing.  She had recent blood work which I reviewed.  She indicates that she drinks 7 or 8 large bottles of water per day, she is advised to talk to you about the appropriate amount of hydration as she may drinking too much water.  She is advised to follow-up with your office.  I answered all her questions today and the patient was in agreement.  Thank you very much for allowing me to participate in the care of this nice patient.  If I can be of any further assistance to you please do not hesitate to call me at [phone].  Sincerely,   Star Age, MD, PhD

## 2019-05-10 ENCOUNTER — Other Ambulatory Visit (HOSPITAL_COMMUNITY)
Admission: RE | Admit: 2019-05-10 | Discharge: 2019-05-10 | Disposition: A | Discharge status: Home / Self Care | Payer: Medicaid Other | Source: Ambulatory Visit | Attending: Internal Medicine | Admitting: Internal Medicine

## 2019-05-10 DIAGNOSIS — Z01812 Encounter for preprocedural laboratory examination: Secondary | ICD-10-CM | POA: Diagnosis not present

## 2019-05-10 DIAGNOSIS — Z20822 Contact with and (suspected) exposure to covid-19: Secondary | ICD-10-CM | POA: Diagnosis not present

## 2019-05-10 LAB — SARS CORONAVIRUS 2 (TAT 6-24 HRS): SARS Coronavirus 2: NEGATIVE

## 2019-05-11 DIAGNOSIS — F411 Generalized anxiety disorder: Secondary | ICD-10-CM | POA: Diagnosis not present

## 2019-05-12 ENCOUNTER — Ambulatory Visit (HOSPITAL_BASED_OUTPATIENT_CLINIC_OR_DEPARTMENT_OTHER): Payer: Medicaid Other | Attending: Internal Medicine | Admitting: Internal Medicine

## 2019-05-12 ENCOUNTER — Other Ambulatory Visit: Payer: Self-pay

## 2019-05-12 VITALS — Ht 66.0 in | Wt 173.0 lb

## 2019-05-12 DIAGNOSIS — G4733 Obstructive sleep apnea (adult) (pediatric): Secondary | ICD-10-CM | POA: Diagnosis not present

## 2019-05-12 DIAGNOSIS — Z791 Long term (current) use of non-steroidal anti-inflammatories (NSAID): Secondary | ICD-10-CM | POA: Diagnosis not present

## 2019-05-12 DIAGNOSIS — Z79899 Other long term (current) drug therapy: Secondary | ICD-10-CM | POA: Diagnosis not present

## 2019-05-12 DIAGNOSIS — R0683 Snoring: Secondary | ICD-10-CM

## 2019-05-12 DIAGNOSIS — R0902 Hypoxemia: Secondary | ICD-10-CM | POA: Diagnosis not present

## 2019-05-16 ENCOUNTER — Ambulatory Visit: Payer: Medicaid Other | Attending: Internal Medicine

## 2019-05-16 DIAGNOSIS — Z23 Encounter for immunization: Secondary | ICD-10-CM

## 2019-05-16 NOTE — Progress Notes (Signed)
   Covid-19 Vaccination Clinic  Name:  Amori Cooperman [facility]    MRN: [id number] DOB: [birth year]  05/16/2019  Ms. Kohen was observed post Covid-19 immunization for 30 minutes based on pre-vaccination screening without incident. She was provided with Vaccine Information Sheet and instruction to access the V-Safe system.   Ms. Lata was instructed to call 911 with any severe reactions post vaccine: Marland Kitchen Difficulty breathing  . Swelling of face and throat  . A fast heartbeat  . A bad rash all over body  . Dizziness and weakness   Immunizations Administered    Name Date Dose VIS Date Route   Pfizer COVID-19 Vaccine 05/16/2019  8:20 AM 0.3 mL 01/13/2019 Intramuscular   Manufacturer: [employer], [employer]   Lot: [id number]   NDC: [address]-0

## 2019-05-17 ENCOUNTER — Other Ambulatory Visit: Payer: Self-pay | Admitting: Internal Medicine

## 2019-05-17 ENCOUNTER — Other Ambulatory Visit: Payer: Self-pay | Admitting: Physical Medicine & Rehabilitation

## 2019-05-17 DIAGNOSIS — E1142 Type 2 diabetes mellitus with diabetic polyneuropathy: Secondary | ICD-10-CM

## 2019-05-17 NOTE — Telephone Encounter (Signed)
Refilled

## 2019-05-17 NOTE — Telephone Encounter (Signed)
Are you going to fill Duloxetine again for patient? Last note 04/24/2019 Cymbalta placed on hold by PCP, will restart- does not appear to be related to tremors as patient has been off medication for 6 weeks without change

## 2019-05-18 DIAGNOSIS — F411 Generalized anxiety disorder: Secondary | ICD-10-CM | POA: Diagnosis not present

## 2019-05-21 ENCOUNTER — Other Ambulatory Visit: Payer: Self-pay | Admitting: Internal Medicine

## 2019-05-21 DIAGNOSIS — R0683 Snoring: Secondary | ICD-10-CM

## 2019-05-21 DIAGNOSIS — G4733 Obstructive sleep apnea (adult) (pediatric): Secondary | ICD-10-CM

## 2019-05-21 NOTE — Procedures (Signed)
    Patient Name: Stacy [name], [place] Date: 05/12/2019 Gender: Female D.O.B: [birth year] Age (years): 63 Referring Provider: Jonah Blue MD Height (inches): 66 Interpreting Physician: Jetty Duhamel MD, ABSM Weight (lbs): 173 RPSGT: Rolene Arbour BMI: 28 MRN: [id number] Neck Size: 14.00  CLINICAL INFORMATION Sleep Study Type: NPSG Indication for sleep study: OSA with Insomnia Epworth Sleepiness Score: 10  SLEEP STUDY TECHNIQUE As per the AASM Manual for the Scoring of Sleep and Associated Events v2.3 (April 2016) with a hypopnea requiring 4% desaturations.  The channels recorded and monitored were frontal, central and occipital EEG, electrooculogram (EOG), submentalis EMG (chin), nasal and oral airflow, thoracic and abdominal wall motion, anterior tibialis EMG, snore microphone, electrocardiogram, and pulse oximetry.  MEDICATIONS Medications self-administered by patient taken the night of the study : CELEBREX, ferosul, GABAPENTIN, MELATONIN  SLEEP ARCHITECTURE The study was initiated at 10:43:12 PM and ended at 4:46:19 AM.  Sleep onset time was 7.4 minutes and the sleep efficiency was 95.5%%. The total sleep time was 346.7 minutes.  Stage REM latency was N/A minutes.  The patient spent 1.4%% of the night in stage N1 sleep, 98.6%% in stage N2 sleep, 0.0%% in stage N3 and 0% in REM.  Alpha intrusion was absent.  Supine sleep was 100.00%.  RESPIRATORY PARAMETERS The overall apnea/hypopnea index (AHI) was 45.3 per hour. There were 52 total apneas, including 49 obstructive, 3 central and 0 mixed apneas. There were 210 hypopneas and 50 RERAs.  The AHI during Stage REM sleep was N/A per hour.  AHI while supine was 45.3 per hour.  The mean oxygen saturation was 94.6%. The minimum SpO2 during sleep was 83.0%.  moderate snoring was noted during this study.  CARDIAC DATA The 2 lead EKG demonstrated sinus rhythm. The mean heart rate was 61.1 beats per minute. Other  EKG findings include: None.  LEG MOVEMENT DATA The total PLMS were 0 with a resulting PLMS index of 0.0. Associated arousal with leg movement index was 0.0 .  IMPRESSIONS - Severe obstructive sleep apnea occurred during this study (AHI = 45.3/h). - No significant central sleep apnea occurred during this study (CAI = 0.5/h). - Mild oxygen desaturation was noted during this study (Min O2 = 83.0%). - The patient snored with moderate snoring volume. - No cardiac abnormalities were noted during this study. - Clinically significant periodic limb movements did not occur during sleep. No significant associated arousals. - Sleep pattern significant for absence of stages N3 and REM. Can't rule out that other sedative medication was taken beyond those listed above.  DIAGNOSIS - Obstructive Sleep Apnea (327.23 [G47.33 ICD-10]) - Nocturnal Hypoxemia (327.26 [G47.36 ICD-10])  RECOMMENDATIONS - Suggest CPAP titration sleep study or autopap. Other options would be based on clinical judgment. - Be careful wih alcohol, sedatives and other CNS depressants that may worsen sleep apnea and disrupt normal sleep architecture. - Sleep hygiene should be reviewed to assess factors that may improve sleep quality. - Weight management and regular exercise should be initiated or continued if appropriate.  [Electronically signed] 05/21/2019 10:41 AM  Jetty Duhamel MD, ABSM Diplomate, American Board of Sleep Medicine   NPI: [id number]                         Jetty Duhamel Diplomate, American Board of Sleep Medicine  ELECTRONICALLY SIGNED ON:  05/21/2019, 10:43 AM [facility] PH: [phone]   FX: [phone] ACCREDITED BY THE AMERICAN ACADEMY OF SLEEP MEDICINE

## 2019-05-22 DIAGNOSIS — J3081 Allergic rhinitis due to animal (cat) (dog) hair and dander: Secondary | ICD-10-CM | POA: Diagnosis not present

## 2019-05-22 DIAGNOSIS — J3089 Other allergic rhinitis: Secondary | ICD-10-CM | POA: Diagnosis not present

## 2019-05-22 DIAGNOSIS — J301 Allergic rhinitis due to pollen: Secondary | ICD-10-CM | POA: Diagnosis not present

## 2019-05-24 ENCOUNTER — Encounter: Payer: Self-pay | Admitting: Internal Medicine

## 2019-05-24 NOTE — Progress Notes (Signed)
I received a note from PA [employer] [facility].  Patient seen 05/08/2019.  Patient diagnosed with GERD with esophagitis without hemorrhage.  Placed on pantoprazole delayed release tablet twice a day.  Diagnosed with gastritis and placed on sucralfate 1 g 3 times daily before meals.  Patient will be due for repeat screening colonoscopy 10/2019.

## 2019-05-25 DIAGNOSIS — F411 Generalized anxiety disorder: Secondary | ICD-10-CM | POA: Diagnosis not present

## 2019-05-29 ENCOUNTER — Other Ambulatory Visit: Payer: Self-pay | Admitting: Specialist

## 2019-05-29 DIAGNOSIS — J301 Allergic rhinitis due to pollen: Secondary | ICD-10-CM | POA: Diagnosis not present

## 2019-05-29 DIAGNOSIS — J3089 Other allergic rhinitis: Secondary | ICD-10-CM | POA: Diagnosis not present

## 2019-05-29 DIAGNOSIS — J3081 Allergic rhinitis due to animal (cat) (dog) hair and dander: Secondary | ICD-10-CM | POA: Diagnosis not present

## 2019-05-31 ENCOUNTER — Other Ambulatory Visit: Payer: Self-pay | Admitting: Orthopaedic Surgery

## 2019-05-31 ENCOUNTER — Telehealth: Payer: Self-pay | Admitting: Orthopaedic Surgery

## 2019-05-31 DIAGNOSIS — F411 Generalized anxiety disorder: Secondary | ICD-10-CM | POA: Diagnosis not present

## 2019-05-31 MED ORDER — HYDROCODONE-ACETAMINOPHEN 5-325 MG PO TABS: 1.0000 | ORAL_TABLET | Freq: Four times a day (QID) | ORAL | 0 refills | Status: DC | PRN

## 2019-05-31 NOTE — Telephone Encounter (Signed)
Please advise 

## 2019-05-31 NOTE — Telephone Encounter (Signed)
Patient called.   She is requesting she be given more oxycodone.   Call back: [phone]

## 2019-05-31 NOTE — Telephone Encounter (Signed)
I actually need her off of oxycodone at this point.  She is now 10 weeks out from her hip replacement.  We need to start weaning her from narcotics.  At this point I will provide hydrocodone and I did send this in.  Let her know that.  Also let her know that after 4 more weeks we will have to stop providing narcotics.

## 2019-06-01 NOTE — Telephone Encounter (Signed)
Patient aware of the below message  

## 2019-06-02 DIAGNOSIS — H93293 Other abnormal auditory perceptions, bilateral: Secondary | ICD-10-CM | POA: Diagnosis not present

## 2019-06-03 ENCOUNTER — Other Ambulatory Visit (HOSPITAL_COMMUNITY)
Admission: RE | Admit: 2019-06-03 | Discharge: 2019-06-03 | Disposition: A | Discharge status: Home / Self Care | Payer: Medicaid Other | Source: Ambulatory Visit | Attending: Internal Medicine | Admitting: Internal Medicine

## 2019-06-03 DIAGNOSIS — Z01812 Encounter for preprocedural laboratory examination: Secondary | ICD-10-CM | POA: Diagnosis not present

## 2019-06-03 DIAGNOSIS — Z20822 Contact with and (suspected) exposure to covid-19: Secondary | ICD-10-CM | POA: Diagnosis not present

## 2019-06-03 LAB — SARS CORONAVIRUS 2 (TAT 6-24 HRS): SARS Coronavirus 2: NEGATIVE

## 2019-06-05 DIAGNOSIS — J3081 Allergic rhinitis due to animal (cat) (dog) hair and dander: Secondary | ICD-10-CM | POA: Diagnosis not present

## 2019-06-05 DIAGNOSIS — J301 Allergic rhinitis due to pollen: Secondary | ICD-10-CM | POA: Diagnosis not present

## 2019-06-05 DIAGNOSIS — F411 Generalized anxiety disorder: Secondary | ICD-10-CM | POA: Diagnosis not present

## 2019-06-05 DIAGNOSIS — J3089 Other allergic rhinitis: Secondary | ICD-10-CM | POA: Diagnosis not present

## 2019-06-06 ENCOUNTER — Other Ambulatory Visit: Payer: Self-pay

## 2019-06-06 ENCOUNTER — Ambulatory Visit (HOSPITAL_BASED_OUTPATIENT_CLINIC_OR_DEPARTMENT_OTHER): Payer: Medicaid Other | Attending: Internal Medicine | Admitting: Internal Medicine

## 2019-06-06 VITALS — Temp 98.0°F | Ht 68.0 in | Wt 173.0 lb

## 2019-06-06 DIAGNOSIS — G4733 Obstructive sleep apnea (adult) (pediatric): Secondary | ICD-10-CM | POA: Diagnosis not present

## 2019-06-07 ENCOUNTER — Telehealth: Payer: Self-pay | Admitting: Internal Medicine

## 2019-06-07 DIAGNOSIS — F411 Generalized anxiety disorder: Secondary | ICD-10-CM | POA: Diagnosis not present

## 2019-06-07 NOTE — Telephone Encounter (Signed)
Patient called in and requested for listed medication to be refilled and sent to [facility].  tiotropium (SPIRIVA HANDIHALER) 18 MCG inhalation capsule [[id number]]

## 2019-06-07 NOTE — Telephone Encounter (Signed)
Please refill if appropriate

## 2019-06-09 MED ORDER — SPIRIVA HANDIHALER 18 MCG IN CAPS: 18.0000 ug | ORAL_CAPSULE | Freq: Every day | RESPIRATORY_TRACT | 6 refills | Status: DC

## 2019-06-09 NOTE — Telephone Encounter (Signed)
Refill sent.

## 2019-06-12 DIAGNOSIS — J3089 Other allergic rhinitis: Secondary | ICD-10-CM | POA: Diagnosis not present

## 2019-06-12 DIAGNOSIS — J3081 Allergic rhinitis due to animal (cat) (dog) hair and dander: Secondary | ICD-10-CM | POA: Diagnosis not present

## 2019-06-12 DIAGNOSIS — J301 Allergic rhinitis due to pollen: Secondary | ICD-10-CM | POA: Diagnosis not present

## 2019-06-14 DIAGNOSIS — F411 Generalized anxiety disorder: Secondary | ICD-10-CM | POA: Diagnosis not present

## 2019-06-15 ENCOUNTER — Other Ambulatory Visit: Payer: Self-pay | Admitting: Internal Medicine

## 2019-06-15 DIAGNOSIS — J449 Chronic obstructive pulmonary disease, unspecified: Secondary | ICD-10-CM

## 2019-06-15 DIAGNOSIS — F17200 Nicotine dependence, unspecified, uncomplicated: Secondary | ICD-10-CM

## 2019-06-16 ENCOUNTER — Other Ambulatory Visit: Payer: Self-pay | Admitting: Specialist

## 2019-06-16 ENCOUNTER — Telehealth: Payer: Self-pay | Admitting: Internal Medicine

## 2019-06-16 NOTE — Telephone Encounter (Signed)
Patient called in and requested for sleep study results. Please follow up at your earliest convenience.

## 2019-06-16 NOTE — Telephone Encounter (Signed)
Sent request to Dr. Nitka 

## 2019-06-16 NOTE — Telephone Encounter (Signed)
Patient called needing Rx refilled Diclofenac. The number to contact patient is [phone]

## 2019-06-17 ENCOUNTER — Other Ambulatory Visit: Payer: Self-pay | Admitting: Internal Medicine

## 2019-06-17 DIAGNOSIS — G4733 Obstructive sleep apnea (adult) (pediatric): Secondary | ICD-10-CM

## 2019-06-17 NOTE — Procedures (Signed)
Patient Name: Stacy [name], Stacy [name] Date: 06/06/2019 Gender: Female D.O.B: 1960/12/24 Age (years): 45 Referring Provider: Jonah Blue MD Height (inches): 66 Interpreting Physician: Jetty Duhamel MD, ABSM Weight (lbs): 173 RPSGT: Rosette Reveal BMI: 28 MRN: [id number] Neck Size: 14.00  CLINICAL INFORMATION The patient is referred for a CPAP titration to treat sleep apnea. Date of NPSG, Split Night or HST: NPSG 05/12/19  AHI 45.3/ hr, desaturation to 83%, body weight 173 lbs  SLEEP STUDY TECHNIQUE As per the AASM Manual for the Scoring of Sleep and Associated Events v2.3 (April 2016) with a hypopnea requiring 4% desaturations.  The channels recorded and monitored were frontal, central and occipital EEG, electrooculogram (EOG), submentalis EMG (chin), nasal and oral airflow, thoracic and abdominal wall motion, anterior tibialis EMG, snore microphone, electrocardiogram, and pulse oximetry. Continuous positive airway pressure (CPAP) was initiated at the beginning of the study and titrated to treat sleep-disordered breathing.  MEDICATIONS Medications self-administered by patient taken the night of the study : CELEBREX, ferosul, GABAPENTIN, MELATONIN  TECHNICIAN COMMENTS Comments added by technician: None Comments added by scorer: N/A  RESPIRATORY PARAMETERS Optimal PAP Pressure (cm): 17 AHI at Optimal Pressure (/hr): 1.9 Overall Minimal O2 (%): 92.0 Supine % at Optimal Pressure (%): 0 Minimal O2 at Optimal Pressure (%): 95.0   SLEEP ARCHITECTURE The study was initiated at 9:38:57 PM and ended at 4:14:29 AM.  Sleep onset time was 46.4 minutes and the sleep efficiency was 64.1%%. The total sleep time was 253.5 minutes.  The patient spent 6.9%% of the night in stage N1 sleep, 83.0%% in stage N2 sleep, 0.0%% in stage N3 and 10.1% in REM.Stage REM latency was 279.5 minutes  Wake after sleep onset was 95.7. Alpha intrusion was absent. Supine sleep was 14.00%.  CARDIAC DATA The  2 lead EKG demonstrated sinus rhythm. The mean heart rate was 62.7 beats per minute. Other EKG findings include: None.  LEG MOVEMENT DATA The total Periodic Limb Movements of Sleep (PLMS) were 0. The PLMS index was 0.0. A PLMS index of <15 is considered normal in adults.  IMPRESSIONS - The optimal PAP pressure was 17 cm of water. - Central sleep apnea was not noted during this titration (CAI = 0.7/h). - Significant oxygen desaturations were not observed during this titration (min O2 = 92.0%). - The patient snored with moderate snoring volume during this titration study. - No cardiac abnormalities were observed during this study. - Periodic limb movements were noted during this study. Mild arousals associated with PLMs (7.6/ hr).  DIAGNOSIS - Obstructive Sleep Apnea (327.23 [G47.33 ICD-10]) - Periodic Limb Movement During Sleep (327.51 [G47.61 ICD-10])  RECOMMENDATIONS - Trial of CPAP therapy on 17 cm H2O or autopap 15-20. - Patient used a Small size Fisher[employer]Paykel Full Face Mask Simplus mask and heated humidification. - Limb movement sleep disturbance may resolve as patient adjusts to CPAP. Otherwise consider a trial of Requip or Mirapex. - Be careful with alcohol, sedatives and other CNS depressants that may worsen sleep apnea and disrupt normal sleep architecture. - Sleep hygiene should be reviewed to assess factors that may improve sleep quality. - Weight management and regular exercise should be initiated or continued.  [Electronically signed] 06/17/2019 11:40 AM  Jetty Duhamel MD, ABSM Diplomate, American Board of Sleep Medicine   NPI: [id number]                         Jetty Duhamel Diplomate, American Board of Sleep Medicine  ELECTRONICALLY SIGNED  ON:  06/17/2019, 11:33 AM [facility] PH: [phone]   FX: [phone] [place]

## 2019-06-19 ENCOUNTER — Telehealth: Payer: Self-pay | Admitting: Internal Medicine

## 2019-06-19 DIAGNOSIS — J3081 Allergic rhinitis due to animal (cat) (dog) hair and dander: Secondary | ICD-10-CM | POA: Diagnosis not present

## 2019-06-19 DIAGNOSIS — F411 Generalized anxiety disorder: Secondary | ICD-10-CM | POA: Diagnosis not present

## 2019-06-19 DIAGNOSIS — J3089 Other allergic rhinitis: Secondary | ICD-10-CM | POA: Diagnosis not present

## 2019-06-19 DIAGNOSIS — J301 Allergic rhinitis due to pollen: Secondary | ICD-10-CM | POA: Diagnosis not present

## 2019-06-19 MED ORDER — DICLOFENAC SODIUM 1 % EX GEL: 4.0000 g | Freq: Four times a day (QID) | CUTANEOUS | 1 refills | Status: DC

## 2019-06-19 NOTE — Telephone Encounter (Signed)
Patient called in and requesting for her cpap script to go to [facility] located at [address] [place], [place] [address]. Fax number [phone].

## 2019-06-19 NOTE — Telephone Encounter (Signed)
Order for CPAP faxed to [facility] as requested.

## 2019-06-20 DIAGNOSIS — J3089 Other allergic rhinitis: Secondary | ICD-10-CM | POA: Diagnosis not present

## 2019-06-20 DIAGNOSIS — J301 Allergic rhinitis due to pollen: Secondary | ICD-10-CM | POA: Diagnosis not present

## 2019-06-20 DIAGNOSIS — J3081 Allergic rhinitis due to animal (cat) (dog) hair and dander: Secondary | ICD-10-CM | POA: Diagnosis not present

## 2019-06-26 NOTE — Telephone Encounter (Signed)
Pt says [facility] is not able to help pt. Pt request any other supply company that can supply for her situation, pt states maybe [facility]. Please advise [phone].

## 2019-06-27 ENCOUNTER — Telehealth: Payer: Self-pay

## 2019-06-27 NOTE — Telephone Encounter (Signed)
Call returned to patient regarding CPAP order.  She said that [facility] is not able to help her and she requested that the order be sent to [facility].   Message sent to [facility] notifying her of the referral and instructing her to contact this CM if she needs additional information that she is not able to obtain from Epic.

## 2019-06-28 DIAGNOSIS — J3081 Allergic rhinitis due to animal (cat) (dog) hair and dander: Secondary | ICD-10-CM | POA: Diagnosis not present

## 2019-06-28 DIAGNOSIS — F411 Generalized anxiety disorder: Secondary | ICD-10-CM | POA: Diagnosis not present

## 2019-06-28 DIAGNOSIS — J301 Allergic rhinitis due to pollen: Secondary | ICD-10-CM | POA: Diagnosis not present

## 2019-06-28 DIAGNOSIS — J3089 Other allergic rhinitis: Secondary | ICD-10-CM | POA: Diagnosis not present

## 2019-07-04 DIAGNOSIS — J3081 Allergic rhinitis due to animal (cat) (dog) hair and dander: Secondary | ICD-10-CM | POA: Diagnosis not present

## 2019-07-04 DIAGNOSIS — J301 Allergic rhinitis due to pollen: Secondary | ICD-10-CM | POA: Diagnosis not present

## 2019-07-04 DIAGNOSIS — J3089 Other allergic rhinitis: Secondary | ICD-10-CM | POA: Diagnosis not present

## 2019-07-05 DIAGNOSIS — F411 Generalized anxiety disorder: Secondary | ICD-10-CM | POA: Diagnosis not present

## 2019-07-06 DIAGNOSIS — J3081 Allergic rhinitis due to animal (cat) (dog) hair and dander: Secondary | ICD-10-CM | POA: Diagnosis not present

## 2019-07-06 DIAGNOSIS — J301 Allergic rhinitis due to pollen: Secondary | ICD-10-CM | POA: Diagnosis not present

## 2019-07-06 DIAGNOSIS — J3089 Other allergic rhinitis: Secondary | ICD-10-CM | POA: Diagnosis not present

## 2019-07-07 ENCOUNTER — Ambulatory Visit: Payer: Medicaid Other | Admitting: Podiatry

## 2019-07-07 ENCOUNTER — Encounter: Payer: Self-pay | Admitting: Podiatry

## 2019-07-07 ENCOUNTER — Other Ambulatory Visit: Payer: Self-pay

## 2019-07-07 DIAGNOSIS — B351 Tinea unguium: Secondary | ICD-10-CM

## 2019-07-07 DIAGNOSIS — I739 Peripheral vascular disease, unspecified: Secondary | ICD-10-CM | POA: Diagnosis not present

## 2019-07-07 DIAGNOSIS — F411 Generalized anxiety disorder: Secondary | ICD-10-CM | POA: Diagnosis not present

## 2019-07-07 DIAGNOSIS — M79676 Pain in unspecified toe(s): Secondary | ICD-10-CM | POA: Diagnosis not present

## 2019-07-07 DIAGNOSIS — E1142 Type 2 diabetes mellitus with diabetic polyneuropathy: Secondary | ICD-10-CM

## 2019-07-07 DIAGNOSIS — R0989 Other specified symptoms and signs involving the circulatory and respiratory systems: Secondary | ICD-10-CM

## 2019-07-07 NOTE — Patient Instructions (Signed)
Diabetes Mellitus and Foot Care Foot care is an important part of your health, especially when you have diabetes. Diabetes may cause you to have problems because of poor blood flow (circulation) to your feet and legs, which can cause your skin to:  Become thinner and drier.  Break more easily.  Heal more slowly.  Peel and crack. You may also have nerve damage (neuropathy) in your legs and feet, causing decreased feeling in them. This means that you may not notice minor injuries to your feet that could lead to more serious problems. Noticing and addressing any potential problems early is the best way to prevent future foot problems. How to care for your feet Foot hygiene  Wash your feet daily with warm water and mild soap. Do not use hot water. Then, pat your feet and the areas between your toes until they are completely dry. Do not soak your feet as this can dry your skin.  Trim your toenails straight across. Do not dig under them or around the cuticle. File the edges of your nails with an emery board or nail file.  Apply a moisturizing lotion or petroleum jelly to the skin on your feet and to dry, brittle toenails. Use lotion that does not contain alcohol and is unscented. Do not apply lotion between your toes. Shoes and socks  Wear clean socks or stockings every day. Make sure they are not too tight. Do not wear knee-high stockings since they may decrease blood flow to your legs.  Wear shoes that fit properly and have enough cushioning. Always look in your shoes before you put them on to be sure there are no objects inside.  To break in new shoes, wear them for just a few hours a day. This prevents injuries on your feet. Wounds, scrapes, corns, and calluses  Check your feet daily for blisters, cuts, bruises, sores, and redness. If you cannot see the bottom of your feet, use a mirror or ask someone for help.  Do not cut corns or calluses or try to remove them with medicine.  If you  find a minor scrape, cut, or break in the skin on your feet, keep it and the skin around it clean and dry. You may clean these areas with mild soap and water. Do not clean the area with peroxide, alcohol, or iodine.  If you have a wound, scrape, corn, or callus on your foot, look at it several times a day to make sure it is healing and not infected. Check for: ? Redness, swelling, or pain. ? Fluid or blood. ? Warmth. ? Pus or a bad smell. General instructions  Do not cross your legs. This may decrease blood flow to your feet.  Do not use heating pads or hot water bottles on your feet. They may burn your skin. If you have lost feeling in your feet or legs, you may not know this is happening until it is too late.  Protect your feet from hot and cold by wearing shoes, such as at the beach or on hot pavement.  Schedule a complete foot exam at least once a year (annually) or more often if you have foot problems. If you have foot problems, report any cuts, sores, or bruises to your health care provider immediately. Contact a health care provider if:  You have a medical condition that increases your risk of infection and you have any cuts, sores, or bruises on your feet.  You have an injury that is not   healing.  You have redness on your legs or feet.  You feel burning or tingling in your legs or feet.  You have pain or cramps in your legs and feet.  Your legs or feet are numb.  Your feet always feel cold.  You have pain around a toenail. Get help right away if:  You have a wound, scrape, corn, or callus on your foot and: ? You have pain, swelling, or redness that gets worse. ? You have fluid or blood coming from the wound, scrape, corn, or callus. ? Your wound, scrape, corn, or callus feels warm to the touch. ? You have pus or a bad smell coming from the wound, scrape, corn, or callus. ? You have a fever. ? You have a red line going up your leg. Summary  Check your feet every day  for cuts, sores, red spots, swelling, and blisters.  Moisturize feet and legs daily.  Wear shoes that fit properly and have enough cushioning.  If you have foot problems, report any cuts, sores, or bruises to your health care provider immediately.  Schedule a complete foot exam at least once a year (annually) or more often if you have foot problems. This information is not intended to replace advice given to you by your health care provider. Make sure you discuss any questions you have with your health care provider. Document Revised: 10/12/2018 Document Reviewed: 02/21/2016 Elsevier Patient Education  2020 Elsevier Inc.  

## 2019-07-10 DIAGNOSIS — J3081 Allergic rhinitis due to animal (cat) (dog) hair and dander: Secondary | ICD-10-CM | POA: Diagnosis not present

## 2019-07-10 DIAGNOSIS — J301 Allergic rhinitis due to pollen: Secondary | ICD-10-CM | POA: Diagnosis not present

## 2019-07-10 DIAGNOSIS — F411 Generalized anxiety disorder: Secondary | ICD-10-CM | POA: Diagnosis not present

## 2019-07-10 DIAGNOSIS — J3089 Other allergic rhinitis: Secondary | ICD-10-CM | POA: Diagnosis not present

## 2019-07-12 DIAGNOSIS — J301 Allergic rhinitis due to pollen: Secondary | ICD-10-CM | POA: Diagnosis not present

## 2019-07-12 DIAGNOSIS — F411 Generalized anxiety disorder: Secondary | ICD-10-CM | POA: Diagnosis not present

## 2019-07-12 DIAGNOSIS — J3081 Allergic rhinitis due to animal (cat) (dog) hair and dander: Secondary | ICD-10-CM | POA: Diagnosis not present

## 2019-07-12 DIAGNOSIS — J3089 Other allergic rhinitis: Secondary | ICD-10-CM | POA: Diagnosis not present

## 2019-07-12 NOTE — Progress Notes (Signed)
Subjective: Stacy [name] is a 59 y.o. female patient seen today at [facility] with history of diabetic neuropathy and painful mycotic nails b/l that are difficult to trim. Pain interferes with ambulation. Aggravating factors include wearing enclosed shoe gear. Pain is relieved with periodic professional debridement.   She voices no new pedal concerns on today's visit.  Patient Active Problem List   Diagnosis Date Noted  . Lumbar radiculopathy 04/24/2019  . 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  . Post laminectomy syndrome 02/23/2019  . Abnormality of gait 02/23/2019  . 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 Unilateral primary osteoarthritis, left hip 12/28/2018  . Lesion of skin of left ear 12/26/2018  . Primary osteoarthritis of left hip 12/02/2018  . Iron deficiency anemia 10/16/2018  . Chronic pain syndrome 09/08/2018  . Chronic pain of right knee 08/11/2018  . Status post lumbar laminectomy 07/15/2018  . Moderate persistent asthma without complication 98/92/1194  . Environmental and seasonal allergies 06/29/2017  . Controlled type 2 diabetes mellitus with diabetic polyneuropathy, without long-term current use of insulin ([place]) 06/29/2017  . Perennial allergic rhinitis 04/08/2017  . Sensorineural hearing loss (SNHL), bilateral 04/08/2017  . Chronic pansinusitis 03/25/2017  . Eustachian tube dysfunction, bilateral 03/25/2017  . 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 Herniation of lumbar intervertebral disc with radiculopathy 10/02/2016    Class: Chronic  . Alopecia areata 08/19/2016  . Chondromalacia of both patellae 06/03/2015    Class: Chronic  . Spinal stenosis, lumbar region, with neurogenic claudication 06/03/2015  . Tobacco use disorder 04/25/2015  . DJD (degenerative joint disease) of knee 01/04/2015  . Hemorrhoid 11/14/2014  . Gout of big toe 07/19/2014  . Essential hypertension 08/14/2013  . Gastroesophageal reflux disease without esophagitis 08/14/2013   . COPD (chronic obstructive pulmonary disease) ([place]) 04/17/2011    Current Outpatient Medications on File Prior to Visit  Medication Sig Dispense Refill  . Accu-Chek Softclix Lancets lancets USE AS INSTRUCTED 3 TIMES A DAY 100 each 12  . albuterol (PROVENTIL) (2.5 MG/3ML) 0.083% nebulizer solution USE ONE VIAL (2.5 MG TOTAL) BY NEBULIZATION EVERY 6 (SIX) HOURS AS NEEDED FOR WHEEZING. 300 mL 0  . ALLERGY RELIEF 10 MG tablet TAKE 1 TABLET (10 MG TOTAL) BY MOUTH DAILY. (Patient taking differently: Take 10 mg by mouth daily. Loratadine) 30 tablet 11  . allopurinol (ZYLOPRIM) 100 MG tablet TAKE 1 TABLET (100 MG TOTAL) BY MOUTH DAILY. 90 tablet 3  . Blood Glucose Monitoring Suppl (ACCU-CHEK AVIVA PLUS) w/Device KIT 1 each by Does not apply route 3 (three) times daily. 1 kit 0  . budesonide-formoterol (SYMBICORT) 80-4.5 MCG/ACT inhaler INHALE TWO PUFFS BY MOUTH TWICE A DAY (Patient taking differently: Inhale 2 puffs into the lungs 2 (two) times a day. ) 1 Inhaler 11  . celecoxib (CELEBREX) 200 MG capsule Take 200 mg by mouth 2 (two) times daily.    . clopidogrel (PLAVIX) 75 MG tablet Take 1 tablet (75 mg total) by mouth daily. 30 tablet 2  . cromolyn (OPTICROM) 4 % ophthalmic solution 1 drop 4 (four) times daily.    . diclofenac sodium (VOLTAREN) 1 % GEL Apply 4 g topically 4 (four) times daily. (Patient taking differently: Apply 4 g topically 2 (two) times daily as needed (pain). ) 5 Tube 6  . diclofenac Sodium (VOLTAREN) 1 % GEL Apply 4 g topically 4 (four) times daily. 500 g 1  . DULoxetine (CYMBALTA) 60 MG capsule Take 1 capsule (60 mg total) by mouth daily. 30 capsule  1  . DULoxetine (CYMBALTA) 60 MG capsule Take 1 capsule (60 mg total) by mouth daily. 30 capsule 3  . EPINEPHrine 0.3 mg/0.3 mL IJ SOAJ injection 0.3 mg IM x 1 PRN for allergic reaction (Patient taking differently: Inject 0.3 mg into the muscle once as needed for anaphylaxis. ) 1 Device 1  . [name] 325 (65 Fe) MG tablet TAKE 1  TABLET BY MOUTH 2 (TWO) TIMES DAILY WITH A MEAL. 60 tablet 2  . fluticasone (FLONASE) 50 MCG/ACT nasal spray Place 1 spray into both nostrils daily. 16 g 6  . gabapentin (NEURONTIN) 600 MG tablet TAKE 1 TABLET (600 MG TOTAL) BY MOUTH 3 (THREE) TIMES DAILY. 90 tablet 1  . glimepiride (AMARYL) 2 MG tablet TAKE 1 TABLET (2 MG TOTAL) BY MOUTH DAILY. 30 tablet 2  . glucose blood (ACCU-CHEK AVIVA PLUS) test strip USE 3 TIMES A DAY AS DIRECTED BY PHYSICIAN 100 each 12  . hydrochlorothiazide (HYDRODIURIL) 25 MG tablet TAKE 1 TABLET (25 MG TOTAL) BY MOUTH DAILY. 90 tablet 3  . HYDROcodone-acetaminophen (NORCO/VICODIN) 5-325 MG tablet Take 1 tablet by mouth every 6 (six) hours as needed for moderate pain. 30 tablet 0  . hydrOXYzine (ATARAX/VISTARIL) 10 MG tablet TAKE 1 TABLET IN THE MORNING AND NOON AND 2 TABLETS IN THE EVENING AS NEEDED (Patient taking differently: Take 10-20 mg by mouth See admin instructions. Take one tablet (10 mg) by mouth in the morning and at noon, take 2 tablets (20 mg) in the evening as needed for itching) 120 tablet 2  . Lancets (ACCU-CHEK SOFT TOUCH) lancets Use as instructed 100 each 12  . Melatonin 3 MG TABS Take 1 tablet (3 mg total) by mouth at bedtime as needed. (Patient taking differently: Take 3 mg by mouth at bedtime as needed (sleep). ) 30 tablet 3  . meloxicam (MOBIC) 15 MG tablet Take 1 tablet (15 mg total) by mouth daily. 30 tablet 1  . methocarbamol (ROBAXIN) 750 MG tablet TAKE 1 TABLET (750 MG TOTAL) BY MOUTH EVERY 8 (EIGHT) HOURS AS NEEDED FOR MUSCLE SPASMS. 40 tablet 1  . montelukast (SINGULAIR) 10 MG tablet Take 10 mg by mouth at bedtime.     . Multiple Vitamin (MULTIVITAMIN WITH MINERALS) TABS tablet Take 1 tablet by mouth daily. CENTRUM SILVER 50 plus    . nicotine (NICODERM CQ - DOSED IN MG/24 HOURS) 14 mg/24hr patch PLACE 1 PATCH ONTO THE SKIN DAILY 28 patch 1  . ondansetron (ZOFRAN ODT) 4 MG disintegrating tablet Take 1 tablet (4 mg total) by mouth every 8  (eight) hours as needed for nausea or vomiting. 20 tablet 0  . pantoprazole (PROTONIX) 40 MG tablet Take 1 tablet (40 mg total) by mouth daily. 30 tablet 2  . Potassium Chloride ER 20 MEQ TBCR Take 20 mEq by mouth daily. 30 tablet 6  . Respiratory Therapy Supplies (FLUTTER) DEVI Use after breathing treatment 4 times daily 1 each 0  . senna-docusate (SENOKOT S) 8.6-50 MG tablet Take 1 tablet by mouth daily as needed for mild constipation. 30 tablet 2  . simvastatin (ZOCOR) 20 MG tablet TAKE ONE TABLET BY MOUTH DAILY AT BEDTIME. 30 tablet 2  . sucralfate (CARAFATE) 1 g tablet Take 1 g by mouth 4 (four) times daily -  with meals and at bedtime.    Marland Kitchen tiotropium (SPIRIVA HANDIHALER) 18 MCG inhalation capsule Place 1 capsule (18 mcg total) into inhaler and inhale daily. 30 capsule 6  . tobramycin-dexamethasone (TOBRADEX) ophthalmic ointment Place 1  application into the left eye 2 (two) times daily at 10 am and 4 pm. (Patient taking differently: Place 1 application into both eyes 2 (two) times daily at 10 am and 4 pm. ) 3.5 g 0  . triamcinolone cream (KENALOG) 0.1 % Apply 1 application topically 2 (two) times daily as needed (lupus rash).     . valsartan-hydrochlorothiazide (DIOVAN-HCT) 160-12.5 MG tablet TAKE ONE TABLET BY MOUTH ONCE DAILY 30 tablet 2  . [DISCONTINUED] Fluticasone-Salmeterol (ADVAIR) 500-50 MCG/DOSE AEPB Inhale 1 puff into the lungs every 12 (twelve) hours.      . [DISCONTINUED] lisinopril (PRINIVIL,ZESTRIL) 40 MG tablet Take 40 mg by mouth daily.       No current facility-administered medications on file prior to visit.    Allergies  Allergen Reactions  . Other Shortness Of Breath    UNSPECIFIED AGENTS Allergic to perfumes and cleaning products  . Shellfish Allergy Anaphylaxis    Per allergy test.  . Ace Inhibitors Cough and Other (See Comments)       . Aspirin Nausea Only  Objective: Physical Exam  General: Patient is a pleasant 59 y.o. African American female in no acute  distress, Awake, alert and oriented x 3  Neurovascular status unchanged b/l.  Capillary refill time to digits <4 seconds b/l. Faintly palpable DP pulses b/l. PT pulse faintly palpable left foot. PT pulse nonpalpable right foot. Pedal hair absent b/l. Skin temperature gradient within normal limits b/l. No pain with calf compression b/l. No edema noted b/l.   Protective sensation intact 5/5 intact bilaterally with 10g monofilament b/l. Vibratory sensation intact b/l. Proprioception intact bilaterally.  Dermatological:  Pedal skin with normal turgor, texture and tone bilaterally. No open wounds bilaterally. No interdigital macerations bilaterally. Toenails 1-5 b/l elongated, discolored, dystrophic, thickened, crumbly with subungual debris and tenderness to dorsal palpation.   Musculoskeletal:  Normal muscle strength 5/5 to all lower extremity muscle groups bilaterally. No pain crepitus or joint limitation noted with ROM b/l. Hallux valgus with bunion deformity noted b/l lower extremities. Patient ambulates independent of any assistive aids.  Assessment and Plan:  1. Pain due to onychomycosis of toenail   2. PAD (peripheral artery disease) ([place])   3. Diminished pulses in lower extremity   4. Diabetic peripheral neuropathy associated with type 2 diabetes mellitus ([place])   -Examined patient. -No new findings. No new orders. -Continue diabetic foot care principles. Literature dispensed on today.  -Toenails 1-5 b/l were debrided in length and girth with sterile nail nippers and dremel without iatrogenic bleeding.  -Refer to Vascular, Dr. Gwenlyn Found, for diminished pulses b/l LE. Risk factors of NIDDM, hyperlipidemia, diminished pedal pulses, tobacco use. -Patient to continue soft, supportive shoe gear daily. -Patient to report any pedal injuries to medical professional immediately. -Patient/POA to call should there be question/concern in the interim.  Return in about 3 months (around 10/07/2019) for  diabetic nail trim.  Marzetta Board, DPM

## 2019-07-13 ENCOUNTER — Telehealth: Payer: Self-pay | Admitting: *Deleted

## 2019-07-13 DIAGNOSIS — R0989 Other specified symptoms and signs involving the circulatory and respiratory systems: Secondary | ICD-10-CM

## 2019-07-13 DIAGNOSIS — E1142 Type 2 diabetes mellitus with diabetic polyneuropathy: Secondary | ICD-10-CM

## 2019-07-13 DIAGNOSIS — I739 Peripheral vascular disease, unspecified: Secondary | ICD-10-CM

## 2019-07-13 NOTE — Telephone Encounter (Signed)
-----   Message from [name], DPM sent at 07/12/2019 12:58 AM EDT ----- Bertis Ruddy let Mrs. Fanton know I would like her to see Dr. Jeri Cos to assess circulation in her legs. She had ABI study in January, 2020 at [facility], and looks like she never saw anyone after that. Not urgent. She has no wounds. Thanks. Risk factors for referral to vascular of smoking, hyperlipidemia, diminished pulses and diabetes.

## 2019-07-13 NOTE — Telephone Encounter (Signed)
I informed pt of Dr. Rodney Cruise orders and a referral would be sent to Dr. Allyson Sabal. Faxed referral and clinicals with demographics to [facility].

## 2019-07-17 ENCOUNTER — Other Ambulatory Visit: Payer: Self-pay | Admitting: Internal Medicine

## 2019-07-17 ENCOUNTER — Other Ambulatory Visit: Payer: Self-pay | Admitting: Physical Medicine & Rehabilitation

## 2019-07-17 DIAGNOSIS — J3081 Allergic rhinitis due to animal (cat) (dog) hair and dander: Secondary | ICD-10-CM | POA: Diagnosis not present

## 2019-07-17 DIAGNOSIS — I739 Peripheral vascular disease, unspecified: Secondary | ICD-10-CM

## 2019-07-17 DIAGNOSIS — F411 Generalized anxiety disorder: Secondary | ICD-10-CM | POA: Diagnosis not present

## 2019-07-17 DIAGNOSIS — J301 Allergic rhinitis due to pollen: Secondary | ICD-10-CM | POA: Diagnosis not present

## 2019-07-17 DIAGNOSIS — J3089 Other allergic rhinitis: Secondary | ICD-10-CM | POA: Diagnosis not present

## 2019-07-17 DIAGNOSIS — I10 Essential (primary) hypertension: Secondary | ICD-10-CM

## 2019-07-17 NOTE — Telephone Encounter (Signed)
Needs listed medication refill request to fill patients bubble packs please follow up at your earliest convenience.   Potassium Chloride ER 20 MEQ TBCR [[id number]]  [facility] - [place], [place] - [address]  [address] [place], [place] [address]

## 2019-07-18 ENCOUNTER — Ambulatory Visit: Payer: Medicaid Other | Admitting: Internal Medicine

## 2019-07-18 DIAGNOSIS — Z96642 Presence of left artificial hip joint: Secondary | ICD-10-CM | POA: Diagnosis not present

## 2019-07-18 DIAGNOSIS — R269 Unspecified abnormalities of gait and mobility: Secondary | ICD-10-CM | POA: Diagnosis not present

## 2019-07-18 DIAGNOSIS — E1140 Type 2 diabetes mellitus with diabetic neuropathy, unspecified: Secondary | ICD-10-CM | POA: Diagnosis not present

## 2019-07-18 DIAGNOSIS — J449 Chronic obstructive pulmonary disease, unspecified: Secondary | ICD-10-CM | POA: Diagnosis not present

## 2019-07-18 DIAGNOSIS — M5116 Intervertebral disc disorders with radiculopathy, lumbar region: Secondary | ICD-10-CM | POA: Diagnosis not present

## 2019-07-18 DIAGNOSIS — G4733 Obstructive sleep apnea (adult) (pediatric): Secondary | ICD-10-CM | POA: Diagnosis not present

## 2019-07-19 ENCOUNTER — Other Ambulatory Visit: Payer: Self-pay | Admitting: Radiology

## 2019-07-19 DIAGNOSIS — F411 Generalized anxiety disorder: Secondary | ICD-10-CM | POA: Diagnosis not present

## 2019-07-20 MED ORDER — DICLOFENAC SODIUM 1 % EX GEL: 4.0000 g | Freq: Four times a day (QID) | CUTANEOUS | 1 refills | Status: DC

## 2019-07-21 DIAGNOSIS — F411 Generalized anxiety disorder: Secondary | ICD-10-CM | POA: Diagnosis not present

## 2019-07-24 ENCOUNTER — Encounter: Payer: Medicaid Other | Attending: Physical Medicine & Rehabilitation | Admitting: Physical Medicine & Rehabilitation

## 2019-07-24 ENCOUNTER — Encounter: Payer: Self-pay | Admitting: Physical Medicine & Rehabilitation

## 2019-07-24 ENCOUNTER — Other Ambulatory Visit: Payer: Self-pay

## 2019-07-24 VITALS — BP 126/84 | HR 68 | Temp 97.8°F | Ht 66.0 in | Wt 173.0 lb

## 2019-07-24 DIAGNOSIS — M25561 Pain in right knee: Secondary | ICD-10-CM | POA: Insufficient documentation

## 2019-07-24 DIAGNOSIS — R269 Unspecified abnormalities of gait and mobility: Secondary | ICD-10-CM | POA: Insufficient documentation

## 2019-07-24 DIAGNOSIS — Z96651 Presence of right artificial knee joint: Secondary | ICD-10-CM | POA: Insufficient documentation

## 2019-07-24 DIAGNOSIS — G894 Chronic pain syndrome: Secondary | ICD-10-CM

## 2019-07-24 DIAGNOSIS — G8929 Other chronic pain: Secondary | ICD-10-CM | POA: Insufficient documentation

## 2019-07-24 DIAGNOSIS — M961 Postlaminectomy syndrome, not elsewhere classified: Secondary | ICD-10-CM | POA: Diagnosis not present

## 2019-07-24 DIAGNOSIS — R251 Tremor, unspecified: Secondary | ICD-10-CM

## 2019-07-24 DIAGNOSIS — F411 Generalized anxiety disorder: Secondary | ICD-10-CM | POA: Diagnosis not present

## 2019-07-24 DIAGNOSIS — Z720 Tobacco use: Secondary | ICD-10-CM

## 2019-07-24 DIAGNOSIS — M5416 Radiculopathy, lumbar region: Secondary | ICD-10-CM

## 2019-07-24 MED ORDER — DULOXETINE HCL 60 MG PO CPEP: 60.0000 mg | ORAL_CAPSULE | Freq: Every day | ORAL | 3 refills | Status: DC

## 2019-07-24 MED ORDER — MELOXICAM 15 MG PO TABS: 15.0000 mg | ORAL_TABLET | Freq: Every day | ORAL | 1 refills | Status: DC

## 2019-07-24 NOTE — Progress Notes (Signed)
Subjective:    Patient ID: Stacy [name], female    DOB: [birth year], 59 y.o.   MRN: [id number]  TELEHEALTH NOTE  Due to national recommendations of social distancing due to COVID 19, an audio/video telehealth visit is felt to be most appropriate for this patient at this time.  See Chart message from today for the patient's consent to telehealth from [facility].     I verified that I am speaking with the correct person using two identifiers.  Location of patient: Ambulance person of provider: Office Method of communication: My Chart video visit Names of participants : Wadie Lessen scheduling, Suezanne Jacquet obtaining consent and vitals if available Established patient  HPI Female with pmh/psh RLS, neuropathy, HTN, headache, GERD, DM, COPD, left THA, lumbar spinal stenosis with claudication s/p lumbar surgery x3 (most recent 07/15/2018), DJD, PAD right TKA in 2017 followed up by manipulation x2, left THA in 03/2019 presents LE pain. Initially stated: History taken from chart review and patient. Started ~2018.  States she had a knee pain with buckling.  Denies inciting event.  Getting improve.  Ice/heat improves the pain.  Prolonged standing exacerbates the pain.  Non-radiating. Sharp.  Intermittent.  Denies associated numbness.  Denies falls. Pain limits prolonged standing and ambulation.   Last clinic visit 04/24/19.  Since that time, she states her knees are doing well, but later states her left knee and back have been hurting.  She is follows up with Ortho this week. She is using Lidoderm patch with benefit. She restarted Cymbalta. She states she is being more active.  Denies falls.  No assistive device needed. She continues to take Gabapentin. She had left THA in 03/2019. She states plans are still for TLIF. Smoking up to 5 cig/day. She followed up with Neurology for tremors, which are intermittent and as told to follow up if needed.   Pain  Inventory Average Pain 7 Pain Right Now 8 My pain is intermittent, constant, sharp, stabbing and tingling  In the last 24 hours, has pain interfered with the following? General activity 7 Relation with others 0 Enjoyment of life 0 What TIME of day is your pain at its worst? daytime Sleep (in general) Good  Pain is worse with: walking, bending, standing and some activites Pain improves with: rest, heat/ice, therapy/exercise, medication and TENS Relief from Meds: 7  Mobility walk with assistance use a cane use a walker how many minutes can you walk? 10-20 ability to climb steps?  no do you drive?  yes Do you have any goals in this area?  no  Function disabled: date disabled 2016  Neuro/Psych tremor trouble walking spasms  Prior Studies Any changes since last visit?  no  Physicians involved in your care Any changes since last visit?  yes Primary care Dr. Jonah Blue Neurologist Huston Foley, MD   Family History  Problem Relation Age of Onset  . Hypertension Father   . Cancer Father   . Heart disease Mother   . Asthma Son        had as a child  . Heart disease Sister   . Breast cancer Sister   . Hypertension Brother    Social History   Socioeconomic History  . Marital status: Married    Spouse name: Not on file  . Number of children: 2  . Years of education: Not on file  . Highest education level: Not on file  Occupational History  . Occupation: unemployed  Employer: UNEMPLOYED  Tobacco Use  . Smoking status: Current Some Day Smoker    Packs/day: 0.25    Years: 32.00    Pack years: 8.00    Types: Cigarettes  . Smokeless tobacco: Never Used  . Tobacco comment: DOWN TO 3 PER DAY  Vaping Use  . Vaping Use: Never used  Substance and Sexual Activity  . Alcohol use: No  . Drug use: No  . Sexual activity: Yes    Birth control/protection: Surgical, Post-menopausal    Comment: tubal ligation  Other Topics Concern  . Not on file  Social  History Narrative  . Not on file   Social Determinants of Health   Financial Resource Strain:   . Difficulty of Paying Living Expenses:   Food Insecurity:   . Worried About Programme researcher, broadcasting/film/video in the Last Year:   . Barista in the Last Year:   Transportation Needs:   . Freight forwarder (Medical):   Marland Kitchen Lack of Transportation (Non-Medical):   Physical Activity:   . Days of Exercise per Week:   . Minutes of Exercise per Session:   Stress:   . Feeling of Stress :   Social Connections:   . Frequency of Communication with Friends and Family:   . Frequency of Social Gatherings with Friends and Family:   . Attends Religious Services:   . Active Member of Clubs or Organizations:   . Attends Banker Meetings:   Marland Kitchen Marital Status:    Past Surgical History:  Procedure Laterality Date  . BACK SURGERY    . CHOLECYSTECTOMY    . COLONOSCOPY    . ENDOMETRIAL ABLATION  10/2010  . EYE SURGERY    . HERNIA REPAIR     umbicial hernia  . JOINT REPLACEMENT    . KNEE ARTHROSCOPY Left    06/07/2017 Dr. August Saucer of [facility]  . KNEE CLOSED REDUCTION Right 12/06/2015   Procedure: CLOSED MANIPULATION RIGHT KNEE;  Surgeon: Kerrin Champagne, MD;  [facility]: [facility];  Service: Orthopedics;  Laterality: Right;  . KNEE CLOSED REDUCTION Right 01/17/2016   Procedure: CLOSED MANIPULATION RIGHT KNEE;  Surgeon: Kerrin Champagne, MD;  [facility];  Service: Orthopedics;  Laterality: Right;  . KNEE JOINT MANIPULATION Right 12/06/2015  . LACRIMAL TUBE INSERTION Bilateral 03/01/2019   Procedure: LACRIMAL TUBE INSERTION;  Surgeon: Aura Camps, MD;  Location: [facility];  Service: Ophthalmology;  Laterality: Bilateral;  . LACRIMAL TUBE REMOVAL Bilateral 05/03/2019   Procedure: BILATERAL NASOLACRIMAL DUCT PROBING, IIRIGATION AND TUBE REMOVAL BOTH EYES;  Surgeon: Aura Camps, MD;  Location: [facility];  Service: Ophthalmology;  Laterality: Bilateral;  . LUMBAR  DISC SURGERY  06/03/2015   L 2  L3 L4 L5   . LUMBAR LAMINECTOMY/DECOMPRESSION MICRODISCECTOMY N/A 06/03/2015   Procedure: Bilateral lateral recess decompression L2-3, L3-4, L4-5;  Surgeon: Kerrin Champagne, MD;  [facility];  Service: Orthopedics;  Laterality: N/A;  . LUMBAR LAMINECTOMY/DECOMPRESSION MICRODISCECTOMY N/A 10/02/2016   Procedure: Right L5-S1 Lateral Recess Decompression  microdiscectomy;  Surgeon: Kerrin Champagne, MD;  Location: [facility];  Service: Orthopedics;  Laterality: N/A;  . LUMBAR LAMINECTOMY/DECOMPRESSION MICRODISCECTOMY N/A 07/15/2018   Procedure: LEFT L3-4 MICRODISCECTOMY;  Surgeon: Kerrin Champagne, MD;  [facility];  Service: Orthopedics;  Laterality: N/A;  . svd      x 2  . TEAR DUCT PROBING Bilateral 03/01/2019   Procedure: TEAR DUCT PROBING WITH IRRIGATION;  Surgeon: Karleen Hampshire,  Casimiro Needle, MD;  Location: [facility];  Service: Ophthalmology;  Laterality: Bilateral;  . TOTAL HIP ARTHROPLASTY Left 03/14/2019   Procedure: LEFT TOTAL HIP ARTHROPLASTY ANTERIOR APPROACH;  Surgeon: [name], MD;  [facility];  Service: Orthopedics;  Laterality: Left;  . TOTAL KNEE ARTHROPLASTY Right 09/06/2015   Procedure: RIGHT TOTAL KNEE ARTHROPLASTY;  Surgeon: Kerrin Champagne, MD;  [facility];  Service: Orthopedics;  Laterality: Right;  . TUBAL LIGATION    . UPPER GASTROINTESTINAL ENDOSCOPY  04/28/11   Past Medical History:  Diagnosis Date  . Allergy    Shellfish, cleaning products  . Anxiety   . Arthritis   . 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 Asthma   . COPD (chronic obstructive pulmonary disease) (HCC)   . Depression   . Diabetes mellitus    Type II  . GERD (gastroesophageal reflux disease)    Pt on Protonix daily  . Glaucoma   . Gout   . Headache(784.0)    otc meds prn  . Hyperlipidemia   . Hypertension   . Irritable bowel syndrome 11/19/2010  . Neuropathy   . Pneumonia YRS AGO  . Restless legs   . Shortness of  breath    07/14/2018- uses  4 times a day   BP 126/84 Comment: at home, this is last recorded  Pulse 68 Comment: at home this is last recorded  Temp 97.8 F (36.6 C) Comment: at home, this is last recorded  Ht 5\' 6"  (1.676 m)   Wt 173 lb (78.5 kg) Comment: pt reported  LMP 09/01/2010   BMI 27.92 kg/m   Opioid Risk Score:   Fall Risk Score:  `1  Depression screen PHQ 2/9  Depression screen [facility] 2/9 07/24/2019 10/14/2018 08/11/2018 08/09/2018 06/06/2018 03/30/2018 02/14/2018  Decreased Interest 0 0 0 0 0 0 0  Down, Depressed, Hopeless 0 0 0 0 0 0 0  PHQ - 2 Score 0 0 0 0 0 0 0  Altered sleeping - - - 0 0 - -  Tired, decreased energy - - - 0 0 - -  Change in appetite - - - 0 0 - -  Feeling bad or failure about yourself  - - - 0 0 - -  Trouble concentrating - - - 0 0 - -  Moving slowly or fidgety/restless - - - 0 0 - -  Suicidal thoughts - - - 0 - - -  PHQ-9 Score - - - 0 0 - -  Difficult doing work/chores - - - Not difficult at all - - -  Some recent data might be hidden    Review of Systems  Constitutional: Positive for diaphoresis.  HENT: Negative.   Eyes: Negative.   Respiratory: Positive for wheezing.        Has cpap now  Cardiovascular: Negative.   Gastrointestinal: Negative.   Endocrine:       High/low blood sugar  Genitourinary: Negative.   Musculoskeletal: Positive for arthralgias, back pain and gait problem.       Spasms   Skin: Negative.   Allergic/Immunologic: Negative.   Neurological:       Tremors are better now/ tingling sometimes  Hematological: Bruises/bleeds easily.       Plavix  Psychiatric/Behavioral: The patient is nervous/anxious.   All other systems reviewed and are negative.     Objective:   Physical Exam Gen: NAD. Vital signs reviewed HENT: Normocephalic. Atraumatic.  Eyes: EOMI. No  discharge.  Pulm: Effort normal MSK:  Gait WNL  No edema.  Neuro:  Alert    Assessment & Plan:  Female with pmh/psh RLS, neuropathy, HTN, headache, GERD, DM,  COPD, left THA, lumbar spinal stenosis with claudication s/p lumbar surgery x3 (most recent 07/15/2018), DJD, PAD right TKA in 2017 followed up by manipulation x2, left THA in 03/2019 presents with LE pain.  1. Chronic right knee pain s/p TKA with manipulation x2             MRI from 09/2017 showing postop changes             Cont Heat/Cold             Continue outpatient PT after THA             Continue  E-stim             Will consider bracing if necessary             Continue OTC Lidoderm patch              Continue Cymbalta             Cont Robaxin per Ortho spine             Continue Mobic 15mg  with food, labs on 03/27/2019 within normal limits             Patient states main goal is to be more active, improving  2. Gait abnormality             No long requiring rollator/wheelchair pain             Left THA on 03/2019             See #1  3. LLE pain             Cont Gabapentin to 600 TID - has been on years             See #1  4. Left hip pain now s/p left THA             See #2  5. Low back pain                      Continue follow up with Ortho spine for ?TLIF, plan to discusses this week  6. Tobacco abuse             Now smoking up to 5 cig/day             Educated/Encouraged cessation again  7. Tremors              See #1             States improving, follow up with Neurology again if necessary

## 2019-07-25 DIAGNOSIS — J3089 Other allergic rhinitis: Secondary | ICD-10-CM | POA: Diagnosis not present

## 2019-07-25 DIAGNOSIS — J3081 Allergic rhinitis due to animal (cat) (dog) hair and dander: Secondary | ICD-10-CM | POA: Diagnosis not present

## 2019-07-25 DIAGNOSIS — J301 Allergic rhinitis due to pollen: Secondary | ICD-10-CM | POA: Diagnosis not present

## 2019-07-26 DIAGNOSIS — F411 Generalized anxiety disorder: Secondary | ICD-10-CM | POA: Diagnosis not present

## 2019-07-27 ENCOUNTER — Other Ambulatory Visit: Payer: Self-pay

## 2019-07-27 ENCOUNTER — Encounter: Payer: Self-pay | Admitting: Specialist

## 2019-07-27 ENCOUNTER — Ambulatory Visit (INDEPENDENT_AMBULATORY_CARE_PROVIDER_SITE_OTHER): Payer: Medicaid Other | Admitting: Specialist

## 2019-07-27 VITALS — BP 142/88 | HR 79 | Ht 66.0 in | Wt 182.0 lb

## 2019-07-27 DIAGNOSIS — M48062 Spinal stenosis, lumbar region with neurogenic claudication: Secondary | ICD-10-CM | POA: Diagnosis not present

## 2019-07-27 DIAGNOSIS — M4807 Spinal stenosis, lumbosacral region: Secondary | ICD-10-CM

## 2019-07-27 DIAGNOSIS — M1711 Unilateral primary osteoarthritis, right knee: Secondary | ICD-10-CM | POA: Diagnosis not present

## 2019-07-27 DIAGNOSIS — M4156 Other secondary scoliosis, lumbar region: Secondary | ICD-10-CM

## 2019-07-27 DIAGNOSIS — M5417 Radiculopathy, lumbosacral region: Secondary | ICD-10-CM

## 2019-07-27 NOTE — Patient Instructions (Signed)
Avoid bending, stooping and avoid lifting weights greater than 10 lbs. Avoid prolong standing and walking. Avoid frequent bending and stooping  No lifting greater than 10 lbs. May use ice or moist heat for pain. Weight loss is of benefit. Handicap license is approved. MRI with and without contrast lumbar spine. Do not want injection of the left knee just yet.

## 2019-07-27 NOTE — Progress Notes (Signed)
Office Visit Note   Patient: Stacy [name]           Date of Birth: [birth year]           MRN: [id number] Visit Date: 07/27/2019              Requested by: [name], MD [address] [place],  [place] [address] PCP: [name], MD   Assessment & Plan: Visit Diagnoses:  1. Unilateral primary osteoarthritis, right knee   2. Spinal stenosis of lumbosacral region   3. Spinal stenosis of lumbar region with neurogenic claudication   4. Other secondary scoliosis, lumbar region   5. Lumbosacral radiculopathy at L3     Plan: Avoid bending, stooping and avoid lifting weights greater than 10 lbs. Avoid prolong standing and walking. Avoid frequent bending and stooping  No lifting greater than 10 lbs. May use ice or moist heat for pain. Weight loss is of benefit. Handicap license is approved. MRI with and without contrast lumbar spine. Do not want injection of the left knee just yet.  Follow-Up Instructions: Return in about 3 weeks (around 08/17/2019).   Orders:  Orders Placed This Encounter  Procedures  . MR Lumbar Spine W Wo Contrast   No orders of the defined types were placed in this encounter.     Procedures: No procedures performed   Clinical Data: No additional findings.   Subjective: Chief Complaint  Patient presents with  . Lower Back - Follow-up, Pain    59 year old female with history of lumbar spinal stenosis that was mild L4-5 and bilateral P-F joint disease she is post right TKR with 2 manipulations post out due to contractures she is still having pain with the left knee, pain with stair climbing, kneeling and bending the knee There is some swelling in the knee and she notes that there is pain that is a "9 or 10". When it calms down she is able to go to sleep. She takes a muscle relaxer. Last injection was sometime last year. No bowel or bladder difficulty. She walks about a mile, before she gets to the end of the street she has to  stop and try to bend over. She complains of left knee numbness and tingling. Sometimes the left knee doesn't  Want to touch the right knee and there is spasms in the back.    Review of Systems  Constitutional: Negative.   HENT: Negative.   Eyes: Negative.   Respiratory: Negative.   Cardiovascular: Negative.   Genitourinary: Negative.   Musculoskeletal: Positive for arthralgias, back pain, gait problem and joint swelling.  Allergic/Immunologic: Negative.   Neurological: Positive for weakness and numbness.  Hematological: Negative.   Psychiatric/Behavioral: Negative.      Objective: Vital Signs: BP (!) 142/88 (BP Location: Left Arm, Patient Position: Sitting)   Pulse 79   Ht 5\' 6"  (1.676 m)   Wt 182 lb (82.6 kg)   LMP 09/01/2010   BMI 29.38 kg/m   Physical Exam Constitutional:      Appearance: She is well-developed.  HENT:     Head: Normocephalic and atraumatic.  Eyes:     Pupils: Pupils are equal, round, and reactive to light.  Pulmonary:     Effort: Pulmonary effort is normal.     Breath sounds: Normal breath sounds.  Abdominal:     General: Bowel sounds are normal.     Palpations: Abdomen is soft.  Musculoskeletal:     Cervical back:  Normal range of motion and neck supple.     Lumbar back: Negative right straight leg raise test and negative left straight leg raise test.  Skin:    General: Skin is warm and dry.  Neurological:     Mental Status: She is alert and oriented to person, place, and time.  Psychiatric:        Behavior: Behavior normal.        Thought Content: Thought content normal.        Judgment: Judgment normal.     Back Exam   Tenderness  The patient is experiencing tenderness in the lumbar.  Range of Motion  Extension: abnormal  Flexion: normal  Lateral bend right: normal  Lateral bend left: normal  Rotation right: normal  Rotation left: normal   Muscle Strength  Right Quadriceps:  5/5  Left Quadriceps:  5/5  Right Hamstrings:  5/5   Left Hamstrings:  5/5   Tests  Straight leg raise right: negative Straight leg raise left: negative  Reflexes  Patellar: 1/4 Achilles: 1/4 Biceps: 1/4 Babinski's sign: normal   Other  Toe walk: normal Heel walk: normal Sensation: decreased Gait: abnormal  Erythema: no back redness Scars: absent      Specialty Comments:  No specialty comments available.  Imaging: No results found.   PMFS History: Patient Active Problem List   Diagnosis Date Noted  . Herniation of lumbar intervertebral disc with radiculopathy 10/02/2016    Priority: High    Class: Chronic  . Chondromalacia of both patellae 06/03/2015    Priority: High    Class: Chronic  . Lumbar radiculopathy 04/24/2019  . 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  . Post laminectomy syndrome 02/23/2019  . Abnormality of gait 02/23/2019  . 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 Unilateral primary osteoarthritis, left hip 12/28/2018  . Lesion of skin of left ear 12/26/2018  . Primary osteoarthritis of left hip 12/02/2018  . Iron deficiency anemia 10/16/2018  . Chronic pain syndrome 09/08/2018  . Chronic pain of right knee 08/11/2018  . Status post lumbar laminectomy 07/15/2018  . Moderate persistent asthma without complication 06/29/2017  . Environmental and seasonal allergies 06/29/2017  . Controlled type 2 diabetes mellitus with diabetic polyneuropathy, without long-term current use of insulin (HCC) 06/29/2017  . Perennial allergic rhinitis 04/08/2017  . Sensorineural hearing loss (SNHL), bilateral 04/08/2017  . Chronic pansinusitis 03/25/2017  . Eustachian tube dysfunction, bilateral 03/25/2017  . 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 Alopecia areata 08/19/2016  . Spinal stenosis, lumbar region, with neurogenic claudication 06/03/2015  . Tobacco use disorder 04/25/2015  . DJD (degenerative joint disease) of knee 01/04/2015  . Hemorrhoid 11/14/2014  . Gout of big toe 07/19/2014  . Essential hypertension  08/14/2013  . Gastroesophageal reflux disease without esophagitis 08/14/2013  . COPD (chronic obstructive pulmonary disease) (HCC) 04/17/2011   Past Medical History:  Diagnosis Date  . Allergy    Shellfish, cleaning products  . Anxiety   . Arthritis   . 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 Asthma   . COPD (chronic obstructive pulmonary disease) (HCC)   . Depression   . Diabetes mellitus    Type II  . GERD (gastroesophageal reflux disease)    Pt on Protonix daily  . Glaucoma   . Gout   . Headache(784.0)    otc meds prn  . Hyperlipidemia   . Hypertension   . Irritable bowel syndrome 11/19/2010  . Neuropathy   .  Pneumonia YRS AGO  . Restless legs   . Shortness of breath    07/14/2018- uses  4 times a day    Family History  Problem Relation Age of Onset  . Hypertension Father   . Cancer Father   . Heart disease Mother   . Asthma Son        had as a child  . Heart disease Sister   . Breast cancer Sister   . Hypertension Brother     Past Surgical History:  Procedure Laterality Date  . BACK SURGERY    . CHOLECYSTECTOMY    . COLONOSCOPY    . ENDOMETRIAL ABLATION  10/2010  . EYE SURGERY    . HERNIA REPAIR     umbicial hernia  . JOINT REPLACEMENT    . KNEE ARTHROSCOPY Left    06/07/2017 Dr. August Saucer of [facility]  . KNEE CLOSED REDUCTION Right 12/06/2015   Procedure: CLOSED MANIPULATION RIGHT KNEE;  Surgeon: Kerrin Champagne, MD;  [facility]: [facility];  Service: Orthopedics;  Laterality: Right;  . KNEE CLOSED REDUCTION Right 01/17/2016   Procedure: CLOSED MANIPULATION RIGHT KNEE;  Surgeon: Kerrin Champagne, MD;  [facility];  Service: Orthopedics;  Laterality: Right;  . KNEE JOINT MANIPULATION Right 12/06/2015  . LACRIMAL TUBE INSERTION Bilateral 03/01/2019   Procedure: LACRIMAL TUBE INSERTION;  Surgeon: Aura Camps, MD;  Location: [facility];  Service: Ophthalmology;  Laterality: Bilateral;  . LACRIMAL TUBE REMOVAL Bilateral 05/03/2019    Procedure: BILATERAL NASOLACRIMAL DUCT PROBING, IIRIGATION AND TUBE REMOVAL BOTH EYES;  Surgeon: Aura Camps, MD;  Location: [facility];  Service: Ophthalmology;  Laterality: Bilateral;  . LUMBAR DISC SURGERY  06/03/2015   L 2  L3 L4 L5   . LUMBAR LAMINECTOMY/DECOMPRESSION MICRODISCECTOMY N/A 06/03/2015   Procedure: Bilateral lateral recess decompression L2-3, L3-4, L4-5;  Surgeon: Kerrin Champagne, MD;  [facility];  Service: Orthopedics;  Laterality: N/A;  . LUMBAR LAMINECTOMY/DECOMPRESSION MICRODISCECTOMY N/A 10/02/2016   Procedure: Right L5-S1 Lateral Recess Decompression  microdiscectomy;  Surgeon: Kerrin Champagne, MD;  Location: [facility];  Service: Orthopedics;  Laterality: N/A;  . LUMBAR LAMINECTOMY/DECOMPRESSION MICRODISCECTOMY N/A 07/15/2018   Procedure: LEFT L3-4 MICRODISCECTOMY;  Surgeon: Kerrin Champagne, MD;  [facility];  Service: Orthopedics;  Laterality: N/A;  . svd      x 2  . TEAR DUCT PROBING Bilateral 03/01/2019   Procedure: TEAR DUCT PROBING WITH IRRIGATION;  Surgeon: Aura Camps, MD;  Location[facility];  Service: Ophthalmology;  Laterality: Bilateral;  . TOTAL HIP ARTHROPLASTY Left 03/14/2019   Procedure: LEFT TOTAL HIP ARTHROPLASTY ANTERIOR APPROACH;  Surgeon: [name], MD;  [facility];  Service: Orthopedics;  Laterality: Left;  . TOTAL KNEE ARTHROPLASTY Right 09/06/2015   Procedure: RIGHT TOTAL KNEE ARTHROPLASTY;  Surgeon: Kerrin Champagne, MD;  [facility];  Service: Orthopedics;  Laterality: Right;  . TUBAL LIGATION    . UPPER GASTROINTESTINAL ENDOSCOPY  04/28/11   Social History   Occupational History  . Occupation: unemployed    Associate Professor: UNEMPLOYED  Tobacco Use  . Smoking status: Current Some Day Smoker    Packs/day: 0.25    Years: 32.00    Pack years: 8.00    Types: Cigarettes  . Smokeless tobacco: Never Used  . Tobacco comment: DOWN TO 3 PER DAY  Vaping Use  . Vaping Use: Never used  Substance  and Sexual Activity  . Alcohol use: No  . Drug  use: No  . Sexual activity: Yes    Birth control/protection: Surgical, Post-menopausal    Comment: tubal ligation

## 2019-07-31 DIAGNOSIS — J3081 Allergic rhinitis due to animal (cat) (dog) hair and dander: Secondary | ICD-10-CM | POA: Diagnosis not present

## 2019-07-31 DIAGNOSIS — J301 Allergic rhinitis due to pollen: Secondary | ICD-10-CM | POA: Diagnosis not present

## 2019-07-31 DIAGNOSIS — J3089 Other allergic rhinitis: Secondary | ICD-10-CM | POA: Diagnosis not present

## 2019-07-31 DIAGNOSIS — F411 Generalized anxiety disorder: Secondary | ICD-10-CM | POA: Diagnosis not present

## 2019-08-02 ENCOUNTER — Other Ambulatory Visit: Payer: Self-pay

## 2019-08-02 ENCOUNTER — Encounter: Payer: Self-pay | Admitting: Cardiovascular Disease

## 2019-08-02 ENCOUNTER — Ambulatory Visit (INDEPENDENT_AMBULATORY_CARE_PROVIDER_SITE_OTHER): Payer: Medicaid Other | Admitting: Cardiovascular Disease

## 2019-08-02 DIAGNOSIS — I10 Essential (primary) hypertension: Secondary | ICD-10-CM | POA: Diagnosis not present

## 2019-08-02 DIAGNOSIS — F17200 Nicotine dependence, unspecified, uncomplicated: Secondary | ICD-10-CM | POA: Diagnosis not present

## 2019-08-02 DIAGNOSIS — E785 Hyperlipidemia, unspecified: Secondary | ICD-10-CM | POA: Insufficient documentation

## 2019-08-02 DIAGNOSIS — I739 Peripheral vascular disease, unspecified: Secondary | ICD-10-CM

## 2019-08-02 DIAGNOSIS — R0600 Dyspnea, unspecified: Secondary | ICD-10-CM | POA: Diagnosis not present

## 2019-08-02 DIAGNOSIS — E782 Mixed hyperlipidemia: Secondary | ICD-10-CM | POA: Diagnosis not present

## 2019-08-02 DIAGNOSIS — R0609 Other forms of dyspnea: Secondary | ICD-10-CM

## 2019-08-02 LAB — LIPID PANEL
Chol/HDL Ratio: 4.2 ratio (ref 0.0–4.4)
Cholesterol, Total: 191 mg/dL (ref 100–199)
HDL: 46 mg/dL (ref >39)
LDL Chol Calc (NIH): 112 mg/dL — ABNORMAL HIGH (ref 0–99)
Triglycerides: 186 mg/dL — ABNORMAL HIGH (ref 0–149)
VLDL Cholesterol Cal: 33 mg/dL (ref 5–40)

## 2019-08-02 LAB — HEPATIC FUNCTION PANEL
ALT: 12 IU/L (ref 0–32)
AST: 17 IU/L (ref 0–40)
Albumin: 4.5 g/dL (ref 3.8–4.9)
Alkaline Phosphatase: 129 IU/L — ABNORMAL HIGH (ref 48–121)
Bilirubin Total: 0.2 mg/dL (ref 0.0–1.2)
Bilirubin, Direct: 0.08 mg/dL (ref 0.00–0.40)
Total Protein: 7.3 g/dL (ref 6.0–8.5)

## 2019-08-02 NOTE — Patient Instructions (Signed)
Medication Instructions:  NO CHANGE *If you need a refill on your cardiac medications before your next appointment, please call your pharmacy*   Lab Work: Your physician recommends that you HAVE LAB WORK TODAY   If you have labs (blood work) drawn today and your tests are completely normal, you will receive your results only by: Marland Kitchen MyChart Message (if you have MyChart) OR . A paper copy in the mail If you have any lab test that is abnormal or we need to change your treatment, we will call you to review the results.   Testing/Procedures: Your physician has requested that you have an echocardiogram. Echocardiography is a painless test that uses sound waves to create images of your heart. It provides your doctor with information about the size and shape of your heart and how well your heart's chambers and valves are working. This procedure takes approximately one hour. There are no restrictions for this procedure.[address]  CT CARDIAC CALCIUM SCORE AT [address]  Your physician has requested that you have a lower extremity arterial duplex. During this test, ultrasound are used to evaluate arterial blood flow in the legs. Allow one hour for this exam. There are no restrictions or special instructions. [facility]  Follow-Up: At [facility], you and your health needs are our priority.  As part of our continuing mission to provide you with exceptional heart care, we have created designated Provider Care Teams.  These Care Teams include your primary Cardiologist (physician) and Advanced Practice Providers (APPs -  Physician Assistants and Nurse Practitioners) who all work together to provide you with the care you need, when you need it.  We recommend signing up for the patient portal called "MyChart".  Sign up information is provided on this After Visit Summary.  MyChart is used to connect with patients for Virtual Visits (Telemedicine).  Patients are able to view  lab/test results, encounter notes, upcoming appointments, etc.  Non-urgent messages can be sent to your provider as well.   To learn more about what you can do with MyChart, go to [email or web address].    Your next appointment:    Your physician recommends that you schedule a follow-up appointment AFTER TESTING COMPLETE WITH DR Allyson Sabal.

## 2019-08-02 NOTE — Assessment & Plan Note (Addendum)
History of dyspnea on exertion especially walking upstairs with a long history tobacco abuse but only smoking 3 cigarettes a day.  I am going to order a 2D echo to further evaluate.

## 2019-08-02 NOTE — Assessment & Plan Note (Signed)
History of essential potential blood pressure measured today 110/80.  She is on hydrochlorothiazide, valsartan

## 2019-08-02 NOTE — Assessment & Plan Note (Signed)
History of hyperlipidemia on statin therapy with lipid profile performed 11/11/2017 revealing a total cholesterol of 189, LDL 99 HDL 47.  Am going to repeat a fasting lipid and liver profile today.  I am also going to get a coronary calcium score to help guide intensity of receptor modification.

## 2019-08-02 NOTE — Assessment & Plan Note (Signed)
Stacy [name] was referred to me by her podiatrist, Dr. Donzetta Matters for absent pedal pulses and claudication.  I am going to get a lower extremity arterial Doppler study to further evaluate.

## 2019-08-02 NOTE — Assessment & Plan Note (Signed)
Ongoing tobacco use of 3 cigarettes a day.  She smoked for last 40 years.

## 2019-08-02 NOTE — Progress Notes (Signed)
   08/02/2019 Stacy [name] Ariel   12/19/1960  [id number]  Primary Physician Johnson, Deborah B, MD Primary Cardiologist: Jonathan J Berry MD FACP, FACC, FAHA, FSCAI  HPI:  Stacy [name] is a 59 y.o. moderately overweight married African-American female mother of 2 sons, grandmother of 9 grandchildren who currently does not work and was referred by Dr. Galloway, her podiatrist, for peripheral vascular evaluation.  Risk factors include smoking 3 cigarettes a day for last 40 years as well as treated hypertension, diabetes and hyperlipidemia.  Her sister did have a myocardial infarction.  She is never had a heart attack or stroke.  She does complain of dyspnea especially walking up steps.  She also complains of claudication which is symmetric over the last year.  Her podiatrist apparently while cutting her toenails could not feel pedal pulses and referred her here for further evaluation.   Current Meds  Medication Sig  . Accu-Chek Softclix Lancets lancets USE AS INSTRUCTED 3 TIMES A DAY  . albuterol (PROVENTIL) (2.5 MG/3ML) 0.083% nebulizer solution USE ONE VIAL (2.5 MG TOTAL) BY NEBULIZATION EVERY 6 (SIX) HOURS AS NEEDED FOR WHEEZING.  . ALLERGY RELIEF 10 MG tablet TAKE 1 TABLET (10 MG TOTAL) BY MOUTH DAILY. (Patient taking differently: Take 10 mg by mouth daily. Loratadine)  . allopurinol (ZYLOPRIM) 100 MG tablet TAKE 1 TABLET (100 MG TOTAL) BY MOUTH DAILY.  . Blood Glucose Monitoring Suppl (ACCU-CHEK AVIVA PLUS) w/Device KIT 1 each by Does not apply route 3 (three) times daily.  . budesonide-formoterol (SYMBICORT) 80-4.5 MCG/ACT inhaler INHALE TWO PUFFS BY MOUTH TWICE A DAY (Patient taking differently: Inhale 2 puffs into the lungs 2 (two) times a day. )  . celecoxib (CELEBREX) 200 MG capsule Take 200 mg by mouth 2 (two) times daily.  . clopidogrel (PLAVIX) 75 MG tablet TAKE 1 TABLET (75 MG TOTAL) BY MOUTH DAILY.  . diclofenac Sodium (VOLTAREN) 1 % GEL Apply 4 g topically 4 (four)  times daily.  . DULoxetine (CYMBALTA) 60 MG capsule Take 1 capsule (60 mg total) by mouth daily.  . EPINEPHrine 0.3 mg/0.3 mL IJ SOAJ injection 0.3 mg IM x 1 PRN for allergic reaction (Patient taking differently: Inject 0.3 mg into the muscle once as needed for anaphylaxis. )  . [name] 325 (65 Fe) MG tablet TAKE 1 TABLET BY MOUTH 2 (TWO) TIMES DAILY WITH A MEAL.  . fluticasone (FLONASE) 50 MCG/ACT nasal spray Place 1 spray into both nostrils daily.  . gabapentin (NEURONTIN) 600 MG tablet TAKE 1 TABLET (600 MG TOTAL) BY MOUTH 3 (THREE) TIMES DAILY.  . glimepiride (AMARYL) 2 MG tablet TAKE 1 TABLET (2 MG TOTAL) BY MOUTH DAILY.  . glucose blood (ACCU-CHEK AVIVA PLUS) test strip USE 3 TIMES A DAY AS DIRECTED BY PHYSICIAN  . hydrochlorothiazide (HYDRODIURIL) 25 MG tablet TAKE 1 TABLET (25 MG TOTAL) BY MOUTH DAILY.  . hydrOXYzine (ATARAX/VISTARIL) 10 MG tablet TAKE 1 TABLET IN THE MORNING AND NOON AND 2 TABLETS IN THE EVENING AS NEEDED (Patient taking differently: Take 10-20 mg by mouth See admin instructions. Take one tablet (10 mg) by mouth in the morning and at noon, take 2 tablets (20 mg) in the evening as needed for itching)  . Lancets (ACCU-CHEK SOFT TOUCH) lancets Use as instructed  . Melatonin 3 MG TABS Take 1 tablet (3 mg total) by mouth at bedtime as needed. (Patient taking differently: Take 3 mg by mouth at bedtime as needed (sleep). )  . meloxicam (MOBIC) 15 MG tablet   Take 1 tablet (15 mg total) by mouth daily.  . methocarbamol (ROBAXIN) 750 MG tablet TAKE 1 TABLET (750 MG TOTAL) BY MOUTH EVERY 8 (EIGHT) HOURS AS NEEDED FOR MUSCLE SPASMS.  . montelukast (SINGULAIR) 10 MG tablet Take 10 mg by mouth at bedtime.   . Multiple Vitamin (MULTIVITAMIN WITH MINERALS) TABS tablet Take 1 tablet by mouth daily. CENTRUM SILVER 50 plus  . nicotine (NICODERM CQ - DOSED IN MG/24 HOURS) 14 mg/24hr patch PLACE 1 PATCH ONTO THE SKIN DAILY  . ondansetron (ZOFRAN ODT) 4 MG disintegrating tablet Take 1 tablet (4  mg total) by mouth every 8 (eight) hours as needed for nausea or vomiting.  . pantoprazole (PROTONIX) 40 MG tablet Take 1 tablet (40 mg total) by mouth daily.  . Potassium Chloride ER 20 MEQ TBCR Take 20 mEq by mouth daily.  . Respiratory Therapy Supplies (FLUTTER) DEVI Use after breathing treatment 4 times daily  . senna-docusate (SENOKOT S) 8.6-50 MG tablet Take 1 tablet by mouth daily as needed for mild constipation.  . simvastatin (ZOCOR) 20 MG tablet TAKE ONE TABLET BY MOUTH DAILY AT BEDTIME.  . sucralfate (CARAFATE) 1 g tablet Take 1 g by mouth 4 (four) times daily -  with meals and at bedtime.  . tiotropium (SPIRIVA HANDIHALER) 18 MCG inhalation capsule Place 1 capsule (18 mcg total) into inhaler and inhale daily.  . triamcinolone cream (KENALOG) 0.1 % Apply 1 application topically 2 (two) times daily as needed (lupus rash).   . valsartan-hydrochlorothiazide (DIOVAN-HCT) 160-12.5 MG tablet TAKE ONE TABLET BY MOUTH ONCE DAILY     Allergies  Allergen Reactions  . Other Shortness Of Breath    UNSPECIFIED AGENTS Allergic to perfumes and cleaning products  . Shellfish Allergy Anaphylaxis    Per allergy test.  . Ace Inhibitors Cough and Other (See Comments)       . Aspirin Nausea Only    Social History   Socioeconomic History  . Marital status: Married    Spouse name: Not on file  . Number of children: 2  . Years of education: Not on file  . Highest education level: Not on file  Occupational History  . Occupation: unemployed    Employer: UNEMPLOYED  Tobacco Use  . Smoking status: Current Some Day Smoker    Packs/day: 0.25    Years: 32.00    Pack years: 8.00    Types: Cigarettes  . Smokeless tobacco: Never Used  . Tobacco comment: DOWN TO 3 PER DAY  Vaping Use  . Vaping Use: Never used  Substance and Sexual Activity  . Alcohol use: No  . Drug use: No  . Sexual activity: Yes    Birth control/protection: Surgical, Post-menopausal    Comment: tubal ligation  Other  Topics Concern  . Not on file  Social History Narrative  . Not on file   Social Determinants of Health   Financial Resource Strain:   . Difficulty of Paying Living Expenses:   Food Insecurity:   . Worried About Running Out of Food in the Last Year:   . Ran Out of Food in the Last Year:   Transportation Needs:   . Lack of Transportation (Medical):   . Lack of Transportation (Non-Medical):   Physical Activity:   . Days of Exercise per Week:   . Minutes of Exercise per Session:   Stress:   . Feeling of Stress :   Social Connections:   . Frequency of Communication with Friends and Family:   .   Frequency of Social Gatherings with Friends and Family:   . Attends Religious Services:   . Active Member of Clubs or Organizations:   . Attends Club or Organization Meetings:   . Marital Status:   Intimate Partner Violence:   . Fear of Current or Ex-Partner:   . Emotionally Abused:   . Physically Abused:   . Sexually Abused:      Review of Systems: General: negative for chills, fever, night sweats or weight changes.  Cardiovascular: negative for chest pain, dyspnea on exertion, edema, orthopnea, palpitations, paroxysmal nocturnal dyspnea or shortness of breath Dermatological: negative for rash Respiratory: negative for cough or wheezing Urologic: negative for hematuria Abdominal: negative for nausea, vomiting, diarrhea, bright red blood per rectum, melena, or hematemesis Neurologic: negative for visual changes, syncope, or dizziness All other systems reviewed and are otherwise negative except as noted above.    Blood pressure 110/80, pulse 75, height 5' 6" (1.676 m), weight 176 lb 6.4 oz (80 kg), last menstrual period 09/01/2010, SpO2 93 %.  General appearance: alert and no distress Neck: no adenopathy, no carotid bruit, no JVD, supple, symmetrical, trachea midline and thyroid not enlarged, symmetric, no tenderness/mass/nodules Lungs: clear to auscultation bilaterally Heart:  regular rate and rhythm, S1, S2 normal, no murmur, click, rub or gallop Extremities: extremities normal, atraumatic, no cyanosis or edema Pulses: Diminished pedal pulses Skin: Skin color, texture, turgor normal. No rashes or lesions Neurologic: Alert and oriented X 3, normal strength and tone. Normal symmetric reflexes. Normal coordination and gait  EKG sinus rhythm at 75 without ST or T wave changes.  I personally reviewed this EKG.  ASSESSMENT AND PLAN:   Essential hypertension History of essential potential blood pressure measured today 110/80.  She is on hydrochlorothiazide, valsartan   Tobacco use disorder Ongoing tobacco use of 3 cigarettes a day.  She smoked for last 40 years.  Peripheral arterial disease (HCC) Ms. Pecore was referred to me by her podiatrist, Dr. Galloway for absent pedal pulses and claudication.  I am going to get a lower extremity arterial Doppler study to further evaluate.  Dyspnea on exertion History of dyspnea on exertion especially walking upstairs with a long history tobacco abuse but only smoking 3 cigarettes a day.  I am going to order a 2D echo to further evaluate.  Hyperlipidemia History of hyperlipidemia on statin therapy with lipid profile performed 11/11/2017 revealing a total cholesterol of 189, LDL 99 HDL 47.  Am going to repeat a fasting lipid and liver profile today.  I am also going to get a coronary calcium score to help guide intensity of receptor modification.      Jonathan J. Berry MD FACP,FACC,FAHA, FSCAI 08/02/2019 10:09 AM 

## 2019-08-03 ENCOUNTER — Other Ambulatory Visit: Payer: Self-pay

## 2019-08-03 DIAGNOSIS — J449 Chronic obstructive pulmonary disease, unspecified: Secondary | ICD-10-CM | POA: Diagnosis not present

## 2019-08-03 DIAGNOSIS — G4733 Obstructive sleep apnea (adult) (pediatric): Secondary | ICD-10-CM | POA: Diagnosis not present

## 2019-08-03 DIAGNOSIS — R269 Unspecified abnormalities of gait and mobility: Secondary | ICD-10-CM | POA: Diagnosis not present

## 2019-08-03 DIAGNOSIS — F411 Generalized anxiety disorder: Secondary | ICD-10-CM | POA: Diagnosis not present

## 2019-08-03 DIAGNOSIS — M5116 Intervertebral disc disorders with radiculopathy, lumbar region: Secondary | ICD-10-CM | POA: Diagnosis not present

## 2019-08-03 DIAGNOSIS — E782 Mixed hyperlipidemia: Secondary | ICD-10-CM

## 2019-08-03 DIAGNOSIS — Z79899 Other long term (current) drug therapy: Secondary | ICD-10-CM

## 2019-08-03 DIAGNOSIS — Z96642 Presence of left artificial hip joint: Secondary | ICD-10-CM | POA: Diagnosis not present

## 2019-08-03 DIAGNOSIS — E1140 Type 2 diabetes mellitus with diabetic neuropathy, unspecified: Secondary | ICD-10-CM | POA: Diagnosis not present

## 2019-08-03 MED ORDER — ATORVASTATIN CALCIUM 20 MG PO TABS: 20.0000 mg | ORAL_TABLET | Freq: Every day | ORAL | 6 refills | Status: DC

## 2019-08-04 ENCOUNTER — Other Ambulatory Visit (HOSPITAL_COMMUNITY): Payer: Medicaid Other

## 2019-08-04 ENCOUNTER — Inpatient Hospital Stay: Admission: RE | Admit: 2019-08-04 | Payer: Medicaid Other | Source: Ambulatory Visit

## 2019-08-08 DIAGNOSIS — J3089 Other allergic rhinitis: Secondary | ICD-10-CM | POA: Diagnosis not present

## 2019-08-08 DIAGNOSIS — J3081 Allergic rhinitis due to animal (cat) (dog) hair and dander: Secondary | ICD-10-CM | POA: Diagnosis not present

## 2019-08-08 DIAGNOSIS — J301 Allergic rhinitis due to pollen: Secondary | ICD-10-CM | POA: Diagnosis not present

## 2019-08-09 ENCOUNTER — Ambulatory Visit (INDEPENDENT_AMBULATORY_CARE_PROVIDER_SITE_OTHER)
Admission: RE | Admit: 2019-08-09 | Discharge: 2019-08-09 | Disposition: A | Discharge status: Home / Self Care | Payer: Self-pay | Source: Ambulatory Visit | Attending: Cardiovascular Disease | Admitting: Cardiovascular Disease

## 2019-08-09 ENCOUNTER — Other Ambulatory Visit: Payer: Self-pay

## 2019-08-09 DIAGNOSIS — R0600 Dyspnea, unspecified: Secondary | ICD-10-CM

## 2019-08-09 DIAGNOSIS — R0609 Other forms of dyspnea: Secondary | ICD-10-CM

## 2019-08-09 DIAGNOSIS — F411 Generalized anxiety disorder: Secondary | ICD-10-CM | POA: Diagnosis not present

## 2019-08-10 ENCOUNTER — Telehealth: Payer: Self-pay | Admitting: Cardiovascular Disease

## 2019-08-10 ENCOUNTER — Ambulatory Visit: Payer: Medicaid Other | Attending: Internal Medicine | Admitting: Internal Medicine

## 2019-08-10 ENCOUNTER — Encounter: Payer: Self-pay | Admitting: Internal Medicine

## 2019-08-10 VITALS — BP 112/67 | HR 89 | Temp 97.0°F | Resp 16 | Wt 176.8 lb

## 2019-08-10 DIAGNOSIS — E663 Overweight: Secondary | ICD-10-CM | POA: Diagnosis not present

## 2019-08-10 DIAGNOSIS — E785 Hyperlipidemia, unspecified: Secondary | ICD-10-CM | POA: Diagnosis not present

## 2019-08-10 DIAGNOSIS — R911 Solitary pulmonary nodule: Secondary | ICD-10-CM | POA: Insufficient documentation

## 2019-08-10 DIAGNOSIS — E1142 Type 2 diabetes mellitus with diabetic polyneuropathy: Secondary | ICD-10-CM | POA: Diagnosis present

## 2019-08-10 DIAGNOSIS — Z79899 Other long term (current) drug therapy: Secondary | ICD-10-CM | POA: Insufficient documentation

## 2019-08-10 DIAGNOSIS — M1710 Unilateral primary osteoarthritis, unspecified knee: Secondary | ICD-10-CM | POA: Diagnosis not present

## 2019-08-10 DIAGNOSIS — I739 Peripheral vascular disease, unspecified: Secondary | ICD-10-CM | POA: Insufficient documentation

## 2019-08-10 DIAGNOSIS — F17210 Nicotine dependence, cigarettes, uncomplicated: Secondary | ICD-10-CM | POA: Diagnosis not present

## 2019-08-10 DIAGNOSIS — I10 Essential (primary) hypertension: Secondary | ICD-10-CM | POA: Diagnosis not present

## 2019-08-10 DIAGNOSIS — J432 Centrilobular emphysema: Secondary | ICD-10-CM | POA: Diagnosis not present

## 2019-08-10 DIAGNOSIS — Z791 Long term (current) use of non-steroidal anti-inflammatories (NSAID): Secondary | ICD-10-CM | POA: Diagnosis not present

## 2019-08-10 DIAGNOSIS — F17200 Nicotine dependence, unspecified, uncomplicated: Secondary | ICD-10-CM

## 2019-08-10 DIAGNOSIS — Z6828 Body mass index (BMI) 28.0-28.9, adult: Secondary | ICD-10-CM | POA: Insufficient documentation

## 2019-08-10 DIAGNOSIS — Z7902 Long term (current) use of antithrombotics/antiplatelets: Secondary | ICD-10-CM | POA: Insufficient documentation

## 2019-08-10 DIAGNOSIS — M109 Gout, unspecified: Secondary | ICD-10-CM | POA: Insufficient documentation

## 2019-08-10 DIAGNOSIS — G4733 Obstructive sleep apnea (adult) (pediatric): Secondary | ICD-10-CM | POA: Insufficient documentation

## 2019-08-10 DIAGNOSIS — Z9989 Dependence on other enabling machines and devices: Secondary | ICD-10-CM

## 2019-08-10 LAB — POCT GLYCOSYLATED HEMOGLOBIN (HGB A1C): HbA1c, POC (prediabetic range): 5.9 % (ref 5.7–6.4)

## 2019-08-10 LAB — GLUCOSE, POCT (MANUAL RESULT ENTRY): POC Glucose: 166 mg/dl — AB (ref 70–99)

## 2019-08-10 NOTE — Telephone Encounter (Signed)
Transferred call to [facility] Triage.

## 2019-08-10 NOTE — Telephone Encounter (Signed)
This needs to be forwarded to her PCP to follow-up.

## 2019-08-10 NOTE — Progress Notes (Signed)
Patient ID: Stacy [name], female    DOB: [birth year]  MRN: [id number]  CC: Diabetes and Hypertension   Subjective: Stacy [name] is a 59 y.o. female who presents for chronic ds management Her concerns today include:  Pt with hx of HTN, DM with neuropathy,tob dep,HL,PAD,IDA,lupus, spinal stenosis with neurogenic claudication(s/p laminectomy 07/2018),COPD, RLS,OA knees,gout, recurrent sinusitis, receiving allergy shots from Dr. Faith Rogue.  PAD: Referred to Dr. Gwenlyn Found by her podiatrist for absent pulses and claudication symptoms.  ABI, Ca+ score was zero and echo ordered.  LAD found to be 112.  Simvastatin changed to atorvastatin.  Incidental finding on CT was a 6 mm lung nodule in the right lower lobe lung.  COPD:  Still at 3 cigarettes/day. Using the patches inconsistently. Smokes when she gets aggravated. Also had some deaths among family and friends. Doing well with inhalers  OSA:  Using CPAP consistently with good results.  Daytime sleepiness/tiredness has resolved.    DIABETES TYPE 2 Last A1C:   Results for orders placed or performed in visit on 08/10/19  POCT glucose (manual entry)  Result Value Ref Range   POC Glucose 166 (A) 70 - 99 mg/dl  POCT glycosylated hemoglobin (Hb A1C)  Result Value Ref Range   Hemoglobin A1C     HbA1c POC (<> result, manual entry)     HbA1c, POC (prediabetic range) 5.9 5.7 - 6.4 %   HbA1c, POC (controlled diabetic range)      Lab Results  Component Value Date   HGBA1C 5.9 08/10/2019   Med Adherence:  _0  Yes  -Amaryl Medication side effects:  _1  Yes    _2  No Home Monitoring?  _3  Yes    _4  No Home glucose results range: 100-113 before BF, and 115-120 at bedtime Diet Adherence: _5  Yes    _6  No Exercise: _7  Yes - walks around her block about 3 x wk Hypoglycemic episodes?: _8  Yes    _9  No Numbness of the feet? _10  Yes    _11  No Retinopathy hx? _12  Yes    _13  No Last eye exam:  02/2019 Dr. Reesa Chew at [facility].   Comments:   HTN:  Compliant with meds and salt restrictions.  No chest pains.  No lower extremity edema.  GERD:  Doing well on Pantoprazole  Tremors: Earlier this year she had complained of tremors that it started after one of her allergy shots and had persisted.  New medication at that time was Cymbalta.  I told her to discontinue the Cymbalta.  Patient states she held off on taking it for a while.  She then restarted the Cymbalta.  She reports that she is doing okay.  Tremors occur occasionally.   Patient Active Problem List   Diagnosis Date Noted  . Centrilobular emphysema ([place]) 08/10/2019  . Dyspnea on exertion 08/02/2019  . Hyperlipidemia 08/02/2019  . Lumbar radiculopathy 04/24/2019  . 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  . Post laminectomy syndrome 02/23/2019  . Abnormality of gait 02/23/2019  . 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 Unilateral primary osteoarthritis, left hip 12/28/2018  . Lesion of skin of left ear 12/26/2018  . Primary osteoarthritis of left hip 12/02/2018  . Iron deficiency anemia 10/16/2018  . Chronic pain syndrome 09/08/2018  . Chronic pain of right knee 08/11/2018  . Status post lumbar laminectomy 07/15/2018  . Peripheral arterial disease ([place]) 04/05/2018  . Moderate persistent asthma without complication 40/98/1191  . Environmental and seasonal allergies 06/29/2017  . Controlled  type 2 diabetes mellitus with diabetic polyneuropathy, without long-term current use of insulin ([place]) 06/29/2017  . Perennial allergic rhinitis 04/08/2017  . Sensorineural hearing loss (SNHL), bilateral 04/08/2017  . Chronic pansinusitis 03/25/2017  . Eustachian tube dysfunction, bilateral 03/25/2017  . Lichen planopilaris 92/33/0076  . Herniation of lumbar intervertebral disc with radiculopathy 10/02/2016    Class: Chronic  . Alopecia areata 08/19/2016  . Chondromalacia of both patellae 06/03/2015    Class: Chronic  . Spinal stenosis, lumbar region, with  neurogenic claudication 06/03/2015  . Tobacco use disorder 04/25/2015  . DJD (degenerative joint disease) of knee 01/04/2015  . Hemorrhoid 11/14/2014  . Gout of big toe 07/19/2014  . Essential hypertension 08/14/2013  . Gastroesophageal reflux disease without esophagitis 08/14/2013  . COPD (chronic obstructive pulmonary disease) ([place]) 04/17/2011     Current Outpatient Medications on File Prior to Visit  Medication Sig Dispense Refill  . Accu-Chek Softclix Lancets lancets USE AS INSTRUCTED 3 TIMES A DAY 100 each 12  . albuterol (PROVENTIL) (2.5 MG/3ML) 0.083% nebulizer solution USE ONE VIAL (2.5 MG TOTAL) BY NEBULIZATION EVERY 6 (SIX) HOURS AS NEEDED FOR WHEEZING. 300 mL 0  . ALLERGY RELIEF 10 MG tablet TAKE 1 TABLET (10 MG TOTAL) BY MOUTH DAILY. (Patient taking differently: Take 10 mg by mouth daily. Loratadine) 30 tablet 11  . allopurinol (ZYLOPRIM) 100 MG tablet TAKE 1 TABLET (100 MG TOTAL) BY MOUTH DAILY. 90 tablet 3  . atorvastatin (LIPITOR) 20 MG tablet Take 1 tablet (20 mg total) by mouth daily. 30 tablet 6  . Blood Glucose Monitoring Suppl (ACCU-CHEK AVIVA PLUS) w/Device KIT 1 each by Does not apply route 3 (three) times daily. 1 kit 0  . budesonide-formoterol (SYMBICORT) 80-4.5 MCG/ACT inhaler INHALE TWO PUFFS BY MOUTH TWICE A DAY (Patient taking differently: Inhale 2 puffs into the lungs 2 (two) times a day. ) 1 Inhaler 11  . celecoxib (CELEBREX) 200 MG capsule Take 200 mg by mouth 2 (two) times daily.    . clopidogrel (PLAVIX) 75 MG tablet TAKE 1 TABLET (75 MG TOTAL) BY MOUTH DAILY. 30 tablet 2  . diclofenac Sodium (VOLTAREN) 1 % GEL Apply 4 g topically 4 (four) times daily. 500 g 1  . DULoxetine (CYMBALTA) 60 MG capsule Take 1 capsule (60 mg total) by mouth daily. 30 capsule 3  . EPINEPHrine 0.3 mg/0.3 mL IJ SOAJ injection 0.3 mg IM x 1 PRN for allergic reaction (Patient taking differently: Inject 0.3 mg into the muscle once as needed for anaphylaxis. ) 1 Device 1  . [name] 325  (65 Fe) MG tablet TAKE 1 TABLET BY MOUTH 2 (TWO) TIMES DAILY WITH A MEAL. 60 tablet 2  . fluticasone (FLONASE) 50 MCG/ACT nasal spray Place 1 spray into both nostrils daily. 16 g 6  . gabapentin (NEURONTIN) 600 MG tablet TAKE 1 TABLET (600 MG TOTAL) BY MOUTH 3 (THREE) TIMES DAILY. 90 tablet 1  . glimepiride (AMARYL) 2 MG tablet TAKE 1 TABLET (2 MG TOTAL) BY MOUTH DAILY. 30 tablet 2  . glucose blood (ACCU-CHEK AVIVA PLUS) test strip USE 3 TIMES A DAY AS DIRECTED BY PHYSICIAN 100 each 12  . hydrochlorothiazide (HYDRODIURIL) 25 MG tablet TAKE 1 TABLET (25 MG TOTAL) BY MOUTH DAILY. 90 tablet 3  . hydrOXYzine (ATARAX/VISTARIL) 10 MG tablet TAKE 1 TABLET IN THE MORNING AND NOON AND 2 TABLETS IN THE EVENING AS NEEDED (Patient taking differently: Take 10-20 mg by mouth See admin instructions. Take one tablet (10 mg) by mouth in the  morning and at noon, take 2 tablets (20 mg) in the evening as needed for itching) 120 tablet 2  . Lancets (ACCU-CHEK SOFT TOUCH) lancets Use as instructed 100 each 12  . Melatonin 3 MG TABS Take 1 tablet (3 mg total) by mouth at bedtime as needed. (Patient taking differently: Take 3 mg by mouth at bedtime as needed (sleep). ) 30 tablet 3  . meloxicam (MOBIC) 15 MG tablet Take 1 tablet (15 mg total) by mouth daily. 30 tablet 1  . methocarbamol (ROBAXIN) 750 MG tablet TAKE 1 TABLET (750 MG TOTAL) BY MOUTH EVERY 8 (EIGHT) HOURS AS NEEDED FOR MUSCLE SPASMS. 40 tablet 1  . montelukast (SINGULAIR) 10 MG tablet Take 10 mg by mouth at bedtime.     . Multiple Vitamin (MULTIVITAMIN WITH MINERALS) TABS tablet Take 1 tablet by mouth daily. CENTRUM SILVER 50 plus    . nicotine (NICODERM CQ - DOSED IN MG/24 HOURS) 14 mg/24hr patch PLACE 1 PATCH ONTO THE SKIN DAILY 28 patch 1  . ondansetron (ZOFRAN ODT) 4 MG disintegrating tablet Take 1 tablet (4 mg total) by mouth every 8 (eight) hours as needed for nausea or vomiting. 20 tablet 0  . pantoprazole (PROTONIX) 40 MG tablet Take 1 tablet (40 mg  total) by mouth daily. 30 tablet 2  . Potassium Chloride ER 20 MEQ TBCR Take 20 mEq by mouth daily. 30 tablet 6  . Respiratory Therapy Supplies (FLUTTER) DEVI Use after breathing treatment 4 times daily 1 each 0  . senna-docusate (SENOKOT S) 8.6-50 MG tablet Take 1 tablet by mouth daily as needed for mild constipation. 30 tablet 2  . sucralfate (CARAFATE) 1 g tablet Take 1 g by mouth 4 (four) times daily -  with meals and at bedtime.    Marland Kitchen tiotropium (SPIRIVA HANDIHALER) 18 MCG inhalation capsule Place 1 capsule (18 mcg total) into inhaler and inhale daily. 30 capsule 6  . triamcinolone cream (KENALOG) 0.1 % Apply 1 application topically 2 (two) times daily as needed (lupus rash).     . valsartan-hydrochlorothiazide (DIOVAN-HCT) 160-12.5 MG tablet TAKE ONE TABLET BY MOUTH ONCE DAILY 30 tablet 2  . [DISCONTINUED] Fluticasone-Salmeterol (ADVAIR) 500-50 MCG/DOSE AEPB Inhale 1 puff into the lungs every 12 (twelve) hours.      . [DISCONTINUED] lisinopril (PRINIVIL,ZESTRIL) 40 MG tablet Take 40 mg by mouth daily.       No current facility-administered medications on file prior to visit.    Allergies  Allergen Reactions  . Other Shortness Of Breath    UNSPECIFIED AGENTS Allergic to perfumes and cleaning products  . Shellfish Allergy Anaphylaxis    Per allergy test.  . Ace Inhibitors Cough and Other (See Comments)       . Aspirin Nausea Only    Social History   Socioeconomic History  . Marital status: Married    Spouse name: Not on file  . Number of children: 2  . Years of education: Not on file  . Highest education level: Not on file  Occupational History  . Occupation: unemployed    Fish farm manager: UNEMPLOYED  Tobacco Use  . Smoking status: Current Some Day Smoker    Packs/day: 0.25    Years: 32.00    Pack years: 8.00    Types: Cigarettes  . Smokeless tobacco: Never Used  . Tobacco comment: DOWN TO 3 PER DAY  Vaping Use  . Vaping Use: Never used  Substance and Sexual Activity  .  Alcohol use: No  . Drug use: No  .  Sexual activity: Yes    Birth control/protection: Surgical, Post-menopausal    Comment: tubal ligation  Other Topics Concern  . Not on file  Social History Narrative  . Not on file   Social Determinants of Health   Financial Resource Strain:   . Difficulty of Paying Living Expenses:   Food Insecurity:   . Worried About Charity fundraiser in the Last Year:   . Arboriculturist in the Last Year:   Transportation Needs:   . Film/video editor (Medical):   Marland Kitchen Lack of Transportation (Non-Medical):   Physical Activity:   . Days of Exercise per Week:   . Minutes of Exercise per Session:   Stress:   . Feeling of Stress :   Social Connections:   . Frequency of Communication with Friends and Family:   . Frequency of Social Gatherings with Friends and Family:   . Attends Religious Services:   . Active Member of Clubs or Organizations:   . Attends Archivist Meetings:   Marland Kitchen Marital Status:   Intimate Partner Violence:   . Fear of Current or Ex-Partner:   . Emotionally Abused:   Marland Kitchen Physically Abused:   . Sexually Abused:     Family History  Problem Relation Age of Onset  . Hypertension Father   . Cancer Father   . Heart disease Mother   . Asthma Son        had as a child  . Heart disease Sister   . Breast cancer Sister   . Hypertension Brother     Past Surgical History:  Procedure Laterality Date  . BACK SURGERY    . CHOLECYSTECTOMY    . COLONOSCOPY    . ENDOMETRIAL ABLATION  10/2010  . EYE SURGERY    . HERNIA REPAIR     umbicial hernia  . JOINT REPLACEMENT Left 03/14/2019   Dr. Ninfa Linden hip  . KNEE ARTHROSCOPY Left    06/07/2017 Dr. Marlou Sa of [facility]  . KNEE CLOSED REDUCTION Right 12/06/2015   Procedure: CLOSED MANIPULATION RIGHT KNEE;  Surgeon: [name], MD;  Location: [place];  Service: Orthopedics;  Laterality: Right;  . KNEE CLOSED REDUCTION Right 01/17/2016   Procedure: CLOSED MANIPULATION RIGHT KNEE;   Surgeon: [name], MD;  Location: [place];  Service: Orthopedics;  Laterality: Right;  . KNEE JOINT MANIPULATION Right 12/06/2015  . LACRIMAL TUBE INSERTION Bilateral 03/01/2019   Procedure: LACRIMAL TUBE INSERTION;  Surgeon: Gevena Cotton, MD;  Location: [facility];  Service: Ophthalmology;  Laterality: Bilateral;  . LACRIMAL TUBE REMOVAL Bilateral 05/03/2019   Procedure: BILATERAL NASOLACRIMAL DUCT PROBING, IIRIGATION AND TUBE REMOVAL BOTH EYES;  Surgeon: Gevena Cotton, MD;  Location: [place];  Service: Ophthalmology;  Laterality: Bilateral;  . LUMBAR DISC SURGERY  06/03/2015   L 2  L3 L4 L5   . LUMBAR LAMINECTOMY/DECOMPRESSION MICRODISCECTOMY N/A 06/03/2015   Procedure: Bilateral lateral recess decompression L2-3, L3-4, L4-5;  Surgeon: [name], MD;  Location: [place];  Service: Orthopedics;  Laterality: N/A;  . LUMBAR LAMINECTOMY/DECOMPRESSION MICRODISCECTOMY N/A 10/02/2016   Procedure: Right L5-S1 Lateral Recess Decompression  microdiscectomy;  Surgeon: [name], MD;  Location: [place];  Service: Orthopedics;  Laterality: N/A;  . LUMBAR LAMINECTOMY/DECOMPRESSION MICRODISCECTOMY N/A 07/15/2018   Procedure: LEFT L3-4 MICRODISCECTOMY;  Surgeon: [name], MD;  Location: [place];  Service: Orthopedics;  Laterality: N/A;  . svd      x 2  .  TEAR DUCT PROBING Bilateral 03/01/2019   Procedure: TEAR DUCT PROBING WITH IRRIGATION;  Surgeon: Gevena Cotton, MD;  Location: [facility];  Service: Ophthalmology;  Laterality: Bilateral;  . TOTAL HIP ARTHROPLASTY Left 03/14/2019   Procedure: LEFT TOTAL HIP ARTHROPLASTY ANTERIOR APPROACH;  Surgeon: Mcarthur Rossetti, MD;  Location: [place];  Service: Orthopedics;  Laterality: Left;  . TOTAL KNEE ARTHROPLASTY Right 09/06/2015   Procedure: RIGHT TOTAL KNEE ARTHROPLASTY;  Surgeon: [name], MD;  Location: [place];  Service: Orthopedics;  Laterality: Right;  . TUBAL LIGATION    . UPPER  GASTROINTESTINAL ENDOSCOPY  04/28/11    ROS: Review of Systems Negative except as stated above  PHYSICAL EXAM: BP 112/67   Pulse 89   Temp (!) 97 F (36.1 C)   Resp 16   Wt 176 lb 12.8 oz (80.2 kg)   LMP 09/01/2010   SpO2 96%   BMI 28.54 kg/m   Wt Readings from Last 3 Encounters:  08/10/19 176 lb 12.8 oz (80.2 kg)  08/02/19 176 lb 6.4 oz (80 kg)  07/27/19 182 lb (82.6 kg)    Physical Exam  General appearance - alert, well appearing, and in no distress Mental status - normal mood, behavior, speech, dress, motor activity, and thought processes Nose - normal and patent, no erythema, discharge or polyps Mouth - mucous membranes moist, pharynx normal without lesions Neck - supple, no significant adenopathy Chest - clear to auscultation, no wheezes, rales or rhonchi, symmetric air entry Heart - normal rate, regular rhythm, normal S1, S2, no murmurs, rubs, clicks or gallops Extremities - peripheral pulses normal, no pedal edema, no clubbing or cyanosis   CMP Latest Ref Rng & Units 08/02/2019 05/03/2019 03/27/2019  Glucose 70 - 99 mg/dL - 125(H) 180(H)  BUN 6 - 20 mg/dL - 11 7  Creatinine 0.44 - 1.00 mg/dL - 0.72 0.81  Sodium 135 - 145 mmol/L - 137 136  Potassium 3.5 - 5.1 mmol/L - 3.5 3.9  Chloride 98 - 111 mmol/L - 103 98  CO2 22 - 32 mmol/L - 25 26  Calcium 8.9 - 10.3 mg/dL - 9.2 9.5  Total Protein 6.0 - 8.5 g/dL 7.3 - 7.2  Total Bilirubin 0.0 - 1.2 mg/dL 0.2 - 0.4  Alkaline Phos 48 - 121 IU/L 129(H) - 107  AST 0 - 40 IU/L 17 - 21  ALT 0 - 32 IU/L 12 - 14   Lipid Panel     Component Value Date/Time   CHOL 191 08/02/2019 1058   TRIG 186 (H) 08/02/2019 1058   HDL 46 08/02/2019 1058   CHOLHDL 4.2 08/02/2019 1058   CHOLHDL 4.2 07/19/2014 1202   VLDL 24 07/19/2014 1202   LDLCALC 112 (H) 08/02/2019 1058    CBC    Component Value Date/Time   WBC 8.0 03/27/2019 1441   RBC 3.79 (L) 03/27/2019 1441   HGB 10.5 (L) 03/27/2019 1441   HGB 11.7 03/02/2019 1114   HCT 33.0  (L) 03/27/2019 1441   HCT 35.8 03/02/2019 1114   PLT 493 (H) 03/27/2019 1441   PLT 269 03/02/2019 1114   MCV 87.1 03/27/2019 1441   MCV 85 03/02/2019 1114   MCH 27.7 03/27/2019 1441   MCHC 31.8 03/27/2019 1441   RDW 15.3 03/27/2019 1441   RDW 14.3 03/02/2019 1114   LYMPHSABS 4.1 (H) 10/14/2018 1704   MONOABS 0.5 03/18/2018 0252   EOSABS 0.1 10/14/2018 1704   BASOSABS 0.0 10/14/2018 1704    ASSESSMENT AND  PLAN:  1. Type 2 diabetes mellitus with diabetic polyneuropathy, without long-term current use of insulin (HCC) At goal.  Continue current medications and healthy eating habits. - POCT glucose (manual entry) - POCT glycosylated hemoglobin (Hb A1C)  2. Essential hypertension At goal.  Continue current medications and low-salt diet  3. OSA on CPAP Patient has been compliant with using CPAP and is benefiting from therapy and that she no longer has daytime sleepiness.  Encouraged her to continue using the CPAP  4. PAD (peripheral artery disease) (HCC) Followed by Dr. Gwenlyn Found  5. Centrilobular emphysema (HCC) Continue current inhalers.  Strongly encouraged her to discontinue smoking.  6. Tobacco use disorder Continue to encourage her to discontinue smoking.  She is trying to quit.  I recommend that she use her patches consistently to help decrease her cravings.  Less than 5 minutes spent on counseling - Microalbumin / creatinine urine ratio  7. Incidental lung nodule, > 52m and < 822mAdvised patient that she is at risk for lung cancer given that she is a smoker.  We will need to repeat CAT scan in 6 months to see whether the nodule has changed in size.  Patient expressed understanding  8. Over weight See #1 above   Patient was given the opportunity to ask questions.  Patient verbalized understanding of the plan and was able to repeat key elements of the plan.   Orders Placed This Encounter  Procedures  . POCT glucose (manual entry)  . POCT glycosylated hemoglobin (Hb A1C)      Requested Prescriptions    No prescriptions requested or ordered in this encounter    No follow-ups on file.  DeKarle PlumberMD, FACP

## 2019-08-10 NOTE — Telephone Encounter (Signed)
Incoming call from [facility] about the patient's CT Cardiac Scoring Test:  EXAM: OVER-READ INTERPRETATION  CT CHEST  IMPRESSION: RIGHT lobe pulmonary nodule. Non-contrast chest CT at 6-12 months is recommended. This recommendation follows the consensus statement: Guidelines for Management of Incidental Pulmonary Nodules Detected on CT Images: From the Fleischner Society 2017; Radiology 2017; 284:228-243.

## 2019-08-10 NOTE — Patient Instructions (Signed)
You have a spot in the right lower lung.  You will need to have a repeat CAT scan in 6 months to see whether this has changed in size or not.  Again I would strongly encourage you to discontinue smoking.  Try to use your patches consistently to help decrease your cravings.

## 2019-08-11 LAB — MICROALBUMIN / CREATININE URINE RATIO
Creatinine, Urine: 111.5 mg/dL
Microalb/Creat Ratio: 10 mg/g creat (ref 0–29)
Microalbumin, Urine: 10.7 ug/mL

## 2019-08-14 DIAGNOSIS — J3081 Allergic rhinitis due to animal (cat) (dog) hair and dander: Secondary | ICD-10-CM | POA: Diagnosis not present

## 2019-08-14 DIAGNOSIS — J301 Allergic rhinitis due to pollen: Secondary | ICD-10-CM | POA: Diagnosis not present

## 2019-08-14 DIAGNOSIS — J3089 Other allergic rhinitis: Secondary | ICD-10-CM | POA: Diagnosis not present

## 2019-08-15 ENCOUNTER — Telehealth: Payer: Self-pay

## 2019-08-15 ENCOUNTER — Other Ambulatory Visit: Payer: Self-pay | Admitting: Internal Medicine

## 2019-08-15 DIAGNOSIS — E1142 Type 2 diabetes mellitus with diabetic polyneuropathy: Secondary | ICD-10-CM

## 2019-08-15 DIAGNOSIS — F17200 Nicotine dependence, unspecified, uncomplicated: Secondary | ICD-10-CM

## 2019-08-15 NOTE — Telephone Encounter (Signed)
Call placed to [facility], spoke to [place] who confirmed that the patient was set up with her CPAP on 07/18/2019

## 2019-08-16 DIAGNOSIS — F411 Generalized anxiety disorder: Secondary | ICD-10-CM | POA: Diagnosis not present

## 2019-08-17 ENCOUNTER — Other Ambulatory Visit: Payer: Self-pay

## 2019-08-17 ENCOUNTER — Ambulatory Visit (INDEPENDENT_AMBULATORY_CARE_PROVIDER_SITE_OTHER): Payer: Medicaid Other | Admitting: Surgery

## 2019-08-17 ENCOUNTER — Encounter: Payer: Self-pay | Admitting: Surgery

## 2019-08-17 VITALS — BP 108/70 | HR 81 | Ht 66.0 in | Wt 176.8 lb

## 2019-08-17 DIAGNOSIS — M48062 Spinal stenosis, lumbar region with neurogenic claudication: Secondary | ICD-10-CM

## 2019-08-17 DIAGNOSIS — M1711 Unilateral primary osteoarthritis, right knee: Secondary | ICD-10-CM

## 2019-08-21 DIAGNOSIS — J301 Allergic rhinitis due to pollen: Secondary | ICD-10-CM | POA: Diagnosis not present

## 2019-08-21 DIAGNOSIS — J3081 Allergic rhinitis due to animal (cat) (dog) hair and dander: Secondary | ICD-10-CM | POA: Diagnosis not present

## 2019-08-21 DIAGNOSIS — J3089 Other allergic rhinitis: Secondary | ICD-10-CM | POA: Diagnosis not present

## 2019-08-23 DIAGNOSIS — F331 Major depressive disorder, recurrent, moderate: Secondary | ICD-10-CM | POA: Diagnosis not present

## 2019-08-25 ENCOUNTER — Ambulatory Visit (HOSPITAL_COMMUNITY): Payer: Medicaid Other | Attending: Cardiology

## 2019-08-25 ENCOUNTER — Other Ambulatory Visit: Payer: Self-pay

## 2019-08-25 DIAGNOSIS — R0600 Dyspnea, unspecified: Secondary | ICD-10-CM

## 2019-08-25 DIAGNOSIS — R0609 Other forms of dyspnea: Secondary | ICD-10-CM

## 2019-08-25 LAB — ECHOCARDIOGRAM COMPLETE
Area-P 1/2: 2.95 cm2
S' Lateral: 3 cm

## 2019-08-26 DIAGNOSIS — F331 Major depressive disorder, recurrent, moderate: Secondary | ICD-10-CM | POA: Diagnosis not present

## 2019-08-30 ENCOUNTER — Ambulatory Visit
Admission: RE | Admit: 2019-08-30 | Discharge: 2019-08-30 | Disposition: A | Discharge status: Home / Self Care | Payer: Medicaid Other | Source: Ambulatory Visit | Attending: Specialist | Admitting: Specialist

## 2019-08-30 DIAGNOSIS — M5127 Other intervertebral disc displacement, lumbosacral region: Secondary | ICD-10-CM | POA: Diagnosis not present

## 2019-08-30 DIAGNOSIS — M5126 Other intervertebral disc displacement, lumbar region: Secondary | ICD-10-CM | POA: Diagnosis not present

## 2019-08-30 DIAGNOSIS — M48061 Spinal stenosis, lumbar region without neurogenic claudication: Secondary | ICD-10-CM | POA: Diagnosis not present

## 2019-08-30 DIAGNOSIS — M4807 Spinal stenosis, lumbosacral region: Secondary | ICD-10-CM

## 2019-08-30 DIAGNOSIS — F331 Major depressive disorder, recurrent, moderate: Secondary | ICD-10-CM | POA: Diagnosis not present

## 2019-08-30 MED ORDER — GADOBENATE DIMEGLUMINE 529 MG/ML IV SOLN
15.0000 mL | Freq: Once | INTRAVENOUS | Status: AC | PRN
  Administered 2019-08-30: 15 mL via INTRAVENOUS

## 2019-09-01 ENCOUNTER — Encounter: Payer: Self-pay | Admitting: Specialist

## 2019-09-01 ENCOUNTER — Other Ambulatory Visit: Payer: Self-pay

## 2019-09-01 ENCOUNTER — Ambulatory Visit (INDEPENDENT_AMBULATORY_CARE_PROVIDER_SITE_OTHER): Payer: Medicaid Other | Admitting: Specialist

## 2019-09-01 VITALS — BP 128/70 | HR 87 | Ht 66.0 in | Wt 175.0 lb

## 2019-09-01 DIAGNOSIS — J301 Allergic rhinitis due to pollen: Secondary | ICD-10-CM | POA: Diagnosis not present

## 2019-09-01 DIAGNOSIS — M4726 Other spondylosis with radiculopathy, lumbar region: Secondary | ICD-10-CM | POA: Diagnosis not present

## 2019-09-01 DIAGNOSIS — M5137 Other intervertebral disc degeneration, lumbosacral region: Secondary | ICD-10-CM | POA: Diagnosis not present

## 2019-09-01 DIAGNOSIS — Z96642 Presence of left artificial hip joint: Secondary | ICD-10-CM | POA: Diagnosis not present

## 2019-09-01 DIAGNOSIS — M1712 Unilateral primary osteoarthritis, left knee: Secondary | ICD-10-CM

## 2019-09-01 DIAGNOSIS — J3089 Other allergic rhinitis: Secondary | ICD-10-CM | POA: Diagnosis not present

## 2019-09-01 DIAGNOSIS — M4316 Spondylolisthesis, lumbar region: Secondary | ICD-10-CM | POA: Diagnosis not present

## 2019-09-01 DIAGNOSIS — J3081 Allergic rhinitis due to animal (cat) (dog) hair and dander: Secondary | ICD-10-CM | POA: Diagnosis not present

## 2019-09-01 MED ORDER — METHOCARBAMOL 750 MG PO TABS: 750.0000 mg | ORAL_TABLET | Freq: Three times a day (TID) | ORAL | 1 refills | Status: DC | PRN

## 2019-09-01 MED ORDER — BUPIVACAINE HCL 0.25 % IJ SOLN
4.0000 mL | INTRAMUSCULAR | Status: AC | PRN
  Administered 2019-09-01: 4 mL via INTRA_ARTICULAR

## 2019-09-01 MED ORDER — METHYLPREDNISOLONE ACETATE 40 MG/ML IJ SUSP
40.0000 mg | INTRAMUSCULAR | Status: AC | PRN
  Administered 2019-09-01: 40 mg via INTRA_ARTICULAR

## 2019-09-01 NOTE — Patient Instructions (Addendum)
Avoid frequent bending and stooping  No lifting greater than 10 lbs. May use ice or moist heat for pain. Weight loss is of benefit. Best medication for lumbar disc disease is arthritis medications like motrin, celebrex and naprosyn. Exercise is important to improve your indurance and does allow people to function better inspite of back pain.  Physical therapy to see if we can improve your back pain and standing and walking tolerance. Then consider a 4 level lumbar fusion if therapy is not of benefit. There is substantial risk of persistent pain even with a  4 level fusion but the MRI suggests there is both nerve compression and disc deterioration that could be improved with Intervention. The risk of further surgery with surgical fusion of more than 1-2 levels is about 20-40% in large series involving  Many patients.    Note concerning trying to replace in a household with one level.  Knee is suffering from osteoarthritis, only real proven treatments are Weight loss, NSIADs like diclofenac and exercise. Well padded shoes help. Ice the knee that is suffering from osteoarthritis, only real proven treatments are Weight loss, NSIADs like diclofenac and exercise. Well padded shoes help. Ice the knee 2-3 times a day 15-20 mins at a time.-3 times a day 15-20 mins at a time. Hot showers in the AM.  Injection with steroid may be of benefit. Hemp CBD capsules, amazon.com 5,000-7,000 mg per bottle, 60 capsules per bottle, take one capsule twice a day. Cane in the left hand to use with left leg weight bearing. Follow-Up Instructions: No follow-ups on file.

## 2019-09-01 NOTE — Addendum Note (Signed)
Addended by: [name] on: 09/01/2019 11:21 AM   Modules accepted: Orders

## 2019-09-01 NOTE — Progress Notes (Signed)
Office Visit Note   Patient: Stacy [name]           Date of Birth: March 28, 1960           MRN: [id number] Visit Date: 09/01/2019              Requested by: Marcine MatarJohnson, Deborah B, MD [address] [place],  [place] [id number] PCP: Marcine MatarJohnson, Deborah B, MD   Assessment & Plan: Visit Diagnoses:  1. Disc disease, degenerative, lumbar or lumbosacral   2. Spondylolisthesis, lumbar region   3. Status post total replacement of left hip   4. Unilateral primary osteoarthritis, left knee   5. Other spondylosis with radiculopathy, lumbar region     Plan: Avoid frequent bending and stooping  No lifting greater than 10 lbs. May use ice or moist heat for pain. Weight loss is of benefit. Best medication for lumbar disc disease is arthritis medications like motrin, celebrex and naprosyn. Exercise is important to improve your indurance and does allow people to function better inspite of back pain.  Physical therapy to see if we can improve your back pain and standing and walking tolerance. Then consider a 4 level lumbar fusion if therapy is not of benefit. There is substantial risk of persistent pain even with a  4 level fusion but the MRI suggests there is both nerve compression and disc deterioration that could be improved with Intervention. The risk of further surgery with surgical fusion of more than 1-2 levels is about 20-40% in large series involving  Many patients.  Note concerning trying to replace in a household with one level.  Follow-Up Instructions: Return in about 4 weeks (around 09/29/2019).   Orders:  Orders Placed This Encounter  Procedures  . Ambulatory referral to Physical Therapy   No orders of the defined types were placed in this encounter.     Procedures: Large Joint Inj: L knee on 09/01/2019 11:17 AM Indications: pain Details: 25 G 1.5 in needle, anterolateral approach  Arthrogram: No  Medications: 40 mg methylPREDNISolone acetate 40 MG/ML; 4 mL bupivacaine  0.25 % Outcome: tolerated well, no immediate complications Procedure, treatment alternatives, risks and benefits explained, specific risks discussed. Consent was given by the patient. Immediately prior to procedure a time out was called to verify the correct patient, procedure, equipment, support staff and site/side marked as required. Patient was prepped and draped in the usual sterile fashion.       Clinical Data: No additional findings.   Subjective: Chief Complaint  Patient presents with  . Lower Back - Follow-up    MRI Review    59 year old female with history of lumbar laminectomy. Previous bilateral partial hemilaminectomies bilateral L2-3, L3-4 and L4-5 in 2017, then right L5-S1 lateral recess decompression in 2019 and left L3-4 microdiscectomy in 2020. She is still having mechanical back greater than leg pain. Pain in the right mid lumbar to L4-5 level with pain with standing in one place and with walking. She is not able to sit in low  Chair and soft chair are not comfortable, able to move out of an elevated chair much easier. She is still living in a two level living quarters, has difficulty walking up and down stairs. Had recent left THR by Dr. Magnus IvanBlackman 2/9. There is no right hip discomfort.   Review of Systems  Constitutional: Negative.   HENT: Negative.   Eyes: Positive for pain, itching and visual disturbance.  Respiratory: Negative.   Cardiovascular: Negative.   Gastrointestinal:  Negative.   Endocrine: Negative.   Genitourinary: Negative.   Musculoskeletal: Negative.   Skin: Negative.   Allergic/Immunologic: Negative.   Neurological: Negative.   Hematological: Negative.   Psychiatric/Behavioral: Negative.      Objective: Vital Signs: BP 128/70 (BP Location: Left Arm, Patient Position: Sitting)   Pulse 87   Ht 5\' 6"  (1.676 m)   Wt 175 lb (79.4 kg)   LMP 09/01/2010   BMI 28.25 kg/m   Physical Exam Constitutional:      Appearance: She is  well-developed.  HENT:     Head: Normocephalic and atraumatic.  Eyes:     Pupils: Pupils are equal, round, and reactive to light.  Pulmonary:     Effort: Pulmonary effort is normal.     Breath sounds: Normal breath sounds.  Abdominal:     General: Bowel sounds are normal.     Palpations: Abdomen is soft.  Musculoskeletal:     Cervical back: Normal range of motion and neck supple.     Lumbar back: Negative right straight leg raise test and negative left straight leg raise test.     Right knee: No effusion.     Instability Tests: Medial McMurray test negative and lateral McMurray test negative.     Left knee:     Instability Tests: Medial McMurray test negative and lateral McMurray test negative.  Skin:    General: Skin is warm and dry.  Neurological:     Mental Status: She is alert and oriented to person, place, and time.  Psychiatric:        Behavior: Behavior normal.        Thought Content: Thought content normal.        Judgment: Judgment normal.     Right Knee Exam   Muscle Strength  The patient has normal right knee strength.  Range of Motion  Extension: normal  Flexion:  100 abnormal   Tests  McMurray:  Medial - negative Lateral - negative Varus: negative Valgus: negative Lachman:  Anterior - negative    Posterior - negative Pivot shift: negative Patellar apprehension: negative  Other  Erythema: absent Scars: present Sensation: normal Swelling: mild Effusion: no effusion present   Left Knee Exam   Muscle Strength  The patient has normal left knee strength.  Range of Motion  Extension: abnormal  Flexion: abnormal   Tests  McMurray:  Medial - negative Lateral - negative Varus: negative Valgus: negative Lachman:  Anterior - positive    Posterior - positive Drawer:  Anterior - positive     Posterior - positive Pivot shift: negative Patellar apprehension: negative  Other  Erythema: absent Scars: absent Sensation: normal Pulse:  present   Back Exam   Tenderness  The patient is experiencing tenderness in the lumbar.  Range of Motion  Extension: abnormal  Flexion: abnormal  Lateral bend right: abnormal  Lateral bend left: abnormal  Rotation right: abnormal  Rotation left: abnormal   Muscle Strength  Right Quadriceps:  5/5  Left Quadriceps:  5/5  Right Hamstrings:  5/5  Left Hamstrings:  5/5   Tests  Straight leg raise right: negative Straight leg raise left: negative  Reflexes  Patellar: 1/4  Other  Toe walk: normal Heel walk: normal Sensation: decreased Gait: antalgic  Erythema: no back redness Scars: present      Specialty Comments:  No specialty comments available.  Imaging: No results found.   PMFS History: Patient Active Problem List   Diagnosis Date Noted  . Herniation  of lumbar intervertebral disc with radiculopathy 10/02/2016    Priority: High    Class: Chronic  . Chondromalacia of both patellae 06/03/2015    Priority: High    Class: Chronic  . Centrilobular emphysema (HCC) 08/10/2019  . Incidental lung nodule, > 30mm and < 62mm 08/10/2019  . OSA on CPAP 08/10/2019  . Dyspnea on exertion 08/02/2019  . Hyperlipidemia 08/02/2019  . Lumbar radiculopathy 04/24/2019  . 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  . Post laminectomy syndrome 02/23/2019  . Abnormality of gait 02/23/2019  . 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 Unilateral primary osteoarthritis, left hip 12/28/2018  . Lesion of skin of left ear 12/26/2018  . Primary osteoarthritis of left hip 12/02/2018  . Iron deficiency anemia 10/16/2018  . Chronic pain syndrome 09/08/2018  . Chronic pain of right knee 08/11/2018  . Status post lumbar laminectomy 07/15/2018  . Peripheral arterial disease (HCC) 04/05/2018  . Moderate persistent asthma without complication 06/29/2017  . Environmental and seasonal allergies 06/29/2017  . Controlled type 2 diabetes mellitus with diabetic polyneuropathy, without long-term  current use of insulin (HCC) 06/29/2017  . Perennial allergic rhinitis 04/08/2017  . Sensorineural hearing loss (SNHL), bilateral 04/08/2017  . Chronic pansinusitis 03/25/2017  . Eustachian tube dysfunction, bilateral 03/25/2017  . 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 Alopecia areata 08/19/2016  . Spinal stenosis, lumbar region, with neurogenic claudication 06/03/2015  . Tobacco use disorder 04/25/2015  . DJD (degenerative joint disease) of knee 01/04/2015  . Hemorrhoid 11/14/2014  . Gout of big toe 07/19/2014  . Essential hypertension 08/14/2013  . Gastroesophageal reflux disease without esophagitis 08/14/2013  . COPD (chronic obstructive pulmonary disease) (HCC) 04/17/2011   Past Medical History:  Diagnosis Date  . Allergy    Shellfish, cleaning products  . Anxiety   . Arthritis   . 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 Asthma   . COPD (chronic obstructive pulmonary disease) (HCC)   . Depression   . Diabetes mellitus    Type II  . GERD (gastroesophageal reflux disease)    Pt on Protonix daily  . Glaucoma   . Gout   . Headache(784.0)    otc meds prn  . Hyperlipidemia   . Hypertension   . Irritable bowel syndrome 11/19/2010  . Neuropathy   . Pneumonia YRS AGO  . Restless legs   . Shortness of breath    07/14/2018- uses  4 times a day    Family History  Problem Relation Age of Onset  . Hypertension Father   . Cancer Father   . Heart disease Mother   . Asthma Son        had as a child  . Heart disease Sister   . Breast cancer Sister   . Hypertension Brother     Past Surgical History:  Procedure Laterality Date  . BACK SURGERY    . CHOLECYSTECTOMY    . COLONOSCOPY    . ENDOMETRIAL ABLATION  10/2010  . EYE SURGERY    . HERNIA REPAIR     umbicial hernia  . JOINT REPLACEMENT Left 03/14/2019   Dr. Magnus Ivan hip  . KNEE ARTHROSCOPY Left    06/07/2017 Dr. August Saucer of [facility]  . KNEE CLOSED REDUCTION Right 12/06/2015   Procedure: CLOSED  MANIPULATION RIGHT KNEE;  Surgeon: Kerrin Champagne, MD;  Location: [facility] OR;  Service: Orthopedics;  Laterality: Right;  . KNEE CLOSED REDUCTION Right 01/17/2016   Procedure: CLOSED MANIPULATION RIGHT KNEE;  Surgeon: Kerrin Champagne, MD;  Location: [facility];  Service: Orthopedics;  Laterality: Right;  . KNEE JOINT MANIPULATION Right 12/06/2015  . LACRIMAL TUBE INSERTION Bilateral 03/01/2019   Procedure: LACRIMAL TUBE INSERTION;  Surgeon: Aura Camps, MD;  Location: [facility];  Service: Ophthalmology;  Laterality: Bilateral;  . LACRIMAL TUBE REMOVAL Bilateral 05/03/2019   Procedure: BILATERAL NASOLACRIMAL DUCT PROBING, IIRIGATION AND TUBE REMOVAL BOTH EYES;  Surgeon: Aura Camps, MD;  Location: [facility];  Service: Ophthalmology;  Laterality: Bilateral;  . LUMBAR DISC SURGERY  06/03/2015   L 2  L3 L4 L5   . LUMBAR LAMINECTOMY/DECOMPRESSION MICRODISCECTOMY N/A 06/03/2015   Procedure: Bilateral lateral recess decompression L2-3, L3-4, L4-5;  Surgeon: Kerrin Champagne, MD;  Location: [facility] OR;  Service: Orthopedics;  Laterality: N/A;  . LUMBAR LAMINECTOMY/DECOMPRESSION MICRODISCECTOMY N/A 10/02/2016   Procedure: Right L5-S1 Lateral Recess Decompression  microdiscectomy;  Surgeon: Kerrin Champagne, MD;  [facility];  Service: Orthopedics;  Laterality: N/A;  . LUMBAR LAMINECTOMY/DECOMPRESSION MICRODISCECTOMY N/A 07/15/2018   Procedure: LEFT L3-4 MICRODISCECTOMY;  Surgeon: Kerrin Champagne, MD;  Location: [facility] OR;  Service: Orthopedics;  Laterality: N/A;  . svd      x 2  . TEAR DUCT PROBING Bilateral 03/01/2019   Procedure: TEAR DUCT PROBING WITH IRRIGATION;  Surgeon: Aura Camps, MD;  Location: [facility];  Service: Ophthalmology;  Laterality: Bilateral;  . TOTAL HIP ARTHROPLASTY Left 03/14/2019   Procedure: LEFT TOTAL HIP ARTHROPLASTY ANTERIOR APPROACH;  Surgeon: [name], MD;  Location: [facility] OR;  Service: Orthopedics;  Laterality: Left;  . TOTAL  KNEE ARTHROPLASTY Right 09/06/2015   Procedure: RIGHT TOTAL KNEE ARTHROPLASTY;  Surgeon: Kerrin Champagne, MD;  Location: [facility] OR;  Service: Orthopedics;  Laterality: Right;  . TUBAL LIGATION    . UPPER GASTROINTESTINAL ENDOSCOPY  04/28/11   Social History   Occupational History  . Occupation: unemployed    Associate Professor: UNEMPLOYED  Tobacco Use  . Smoking status: Current Some Day Smoker    Packs/day: 0.25    Years: 32.00    Pack years: 8.00    Types: Cigarettes  . Smokeless tobacco: Never Used  . Tobacco comment: DOWN TO 3 PER DAY  Vaping Use  . Vaping Use: Never used  Substance and Sexual Activity  . Alcohol use: No  . Drug use: No  . Sexual activity: Yes    Birth control/protection: Surgical, Post-menopausal    Comment: tubal ligation

## 2019-09-03 DIAGNOSIS — Z96642 Presence of left artificial hip joint: Secondary | ICD-10-CM | POA: Diagnosis not present

## 2019-09-03 DIAGNOSIS — G4733 Obstructive sleep apnea (adult) (pediatric): Secondary | ICD-10-CM | POA: Diagnosis not present

## 2019-09-03 DIAGNOSIS — M5116 Intervertebral disc disorders with radiculopathy, lumbar region: Secondary | ICD-10-CM | POA: Diagnosis not present

## 2019-09-03 DIAGNOSIS — R269 Unspecified abnormalities of gait and mobility: Secondary | ICD-10-CM | POA: Diagnosis not present

## 2019-09-03 DIAGNOSIS — J449 Chronic obstructive pulmonary disease, unspecified: Secondary | ICD-10-CM | POA: Diagnosis not present

## 2019-09-03 DIAGNOSIS — E1140 Type 2 diabetes mellitus with diabetic neuropathy, unspecified: Secondary | ICD-10-CM | POA: Diagnosis not present

## 2019-09-04 DIAGNOSIS — J3081 Allergic rhinitis due to animal (cat) (dog) hair and dander: Secondary | ICD-10-CM | POA: Diagnosis not present

## 2019-09-04 DIAGNOSIS — J301 Allergic rhinitis due to pollen: Secondary | ICD-10-CM | POA: Diagnosis not present

## 2019-09-04 DIAGNOSIS — J3089 Other allergic rhinitis: Secondary | ICD-10-CM | POA: Diagnosis not present

## 2019-09-05 ENCOUNTER — Telehealth: Payer: Self-pay | Admitting: Specialist

## 2019-09-05 ENCOUNTER — Ambulatory Visit (HOSPITAL_COMMUNITY)
Admission: RE | Admit: 2019-09-05 | Discharge: 2019-09-05 | Disposition: A | Discharge status: Home / Self Care | Payer: Medicaid Other | Source: Ambulatory Visit | Attending: Internal Medicine | Admitting: Internal Medicine

## 2019-09-05 ENCOUNTER — Other Ambulatory Visit: Payer: Self-pay

## 2019-09-05 DIAGNOSIS — I739 Peripheral vascular disease, unspecified: Secondary | ICD-10-CM | POA: Diagnosis not present

## 2019-09-05 NOTE — Telephone Encounter (Signed)
Pt called stating Dr.Nitka was supposed to write a letter for her in regards to a 1 bedroom and pt would like a CB when it's ready for pick up.

## 2019-09-05 NOTE — Telephone Encounter (Signed)
Pt called stating Dr.Nitka was supposed to write a letter for her in regards to a 1 bedroom and pt would like a CB when it's ready for pick up.  [phone]

## 2019-09-06 DIAGNOSIS — F331 Major depressive disorder, recurrent, moderate: Secondary | ICD-10-CM | POA: Diagnosis not present

## 2019-09-11 DIAGNOSIS — J3089 Other allergic rhinitis: Secondary | ICD-10-CM | POA: Diagnosis not present

## 2019-09-11 DIAGNOSIS — J301 Allergic rhinitis due to pollen: Secondary | ICD-10-CM | POA: Diagnosis not present

## 2019-09-11 DIAGNOSIS — J3081 Allergic rhinitis due to animal (cat) (dog) hair and dander: Secondary | ICD-10-CM | POA: Diagnosis not present

## 2019-09-12 ENCOUNTER — Other Ambulatory Visit: Payer: Self-pay

## 2019-09-12 ENCOUNTER — Ambulatory Visit (INDEPENDENT_AMBULATORY_CARE_PROVIDER_SITE_OTHER): Payer: Medicaid Other | Admitting: Cardiovascular Disease

## 2019-09-12 ENCOUNTER — Encounter: Payer: Self-pay | Admitting: Cardiovascular Disease

## 2019-09-12 VITALS — BP 112/74 | HR 78 | Ht 66.0 in | Wt 174.8 lb

## 2019-09-12 DIAGNOSIS — I739 Peripheral vascular disease, unspecified: Secondary | ICD-10-CM

## 2019-09-12 NOTE — Patient Instructions (Signed)
Medication Instructions:  °Your physician recommends that you continue on your current medications as directed. Please refer to the Current Medication list given to you today. ° °Follow-Up: °At [facility], you and your health needs are our priority.  As part of our continuing mission to provide you with exceptional heart care, we have created designated Provider Care Teams.  These Care Teams include your primary Cardiologist (physician) and Advanced Practice Providers (APPs -  Physician Assistants and Nurse Practitioners) who all work together to provide you with the care you need, when you need it. ° °We recommend signing up for the patient portal called "MyChart".  Sign up information is provided on this After Visit Summary.  MyChart is used to connect with patients for Virtual Visits (Telemedicine).  Patients are able to view lab/test results, encounter notes, upcoming appointments, etc.  Non-urgent messages can be sent to your provider as well.   °To learn more about what you can do with MyChart, go to [email or web address].   ° °Your next appointment:   °AS NEEDED with Dr. Berry °

## 2019-09-12 NOTE — Progress Notes (Signed)
Ms. Papaleo returns today for follow-up of her noninvasive test. Her 2D echocardiogram was unrevealing with normal LV systolic function and no significant valvular abnormalities. Her RV systolic pressure was moderately increased but I suspect this is related to all long history of tobacco abuse. Her lower extremity arterial Doppler studies performed 09/05/2019 revealed normal ABIs bilaterally. Her LDL was 112, acceptable for primary prevention. I will see her back as needed.  Runell Gess, M.D., FACP, [facility] [address] [place], [place]  [address]  [phone] 09/12/2019 12:27 PM

## 2019-09-13 ENCOUNTER — Ambulatory Visit: Payer: Medicaid Other | Attending: Specialist | Admitting: Physical Therapy

## 2019-09-13 ENCOUNTER — Encounter: Payer: Self-pay | Admitting: Physical Therapy

## 2019-09-13 DIAGNOSIS — M62830 Muscle spasm of back: Secondary | ICD-10-CM

## 2019-09-13 DIAGNOSIS — M5441 Lumbago with sciatica, right side: Secondary | ICD-10-CM | POA: Diagnosis present

## 2019-09-13 DIAGNOSIS — M5442 Lumbago with sciatica, left side: Secondary | ICD-10-CM | POA: Insufficient documentation

## 2019-09-13 DIAGNOSIS — R262 Difficulty in walking, not elsewhere classified: Secondary | ICD-10-CM

## 2019-09-13 DIAGNOSIS — F331 Major depressive disorder, recurrent, moderate: Secondary | ICD-10-CM | POA: Diagnosis not present

## 2019-09-13 NOTE — Therapy (Signed)
[facility] [address] [place], [place], [address] Phone: [phone]   Fax:  [phone]  Physical Therapy Evaluation  Patient Details  Name: St[facility]amela [facility] MRN: [id number] Date of Birth: January 26, 1961 Referring Provider (PT): Otelia SergeantNitka   Encounter Date: 09/13/2019   PT End of Session - 09/13/19 0836    Visit Number 1    Date for PT Re-Evaluation 11/13/19    Authorization Type Medicaid    PT Start Time 0800    PT Stop Time 0842    PT Time Calculation (min) 42 min    Activity Tolerance Patient tolerated treatment well    Behavior During Therapy [facility] for tasks assessed/performed           Past Medical History:  Diagnosis Date  . Allergy    Shellfish, cleaning products  . Anxiety   . Arthritis   . 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 Asthma   . COPD (chronic obstructive pulmonary disease) (HCC)   . Depression   . Diabetes mellitus    Type II  . GERD (gastroesophageal reflux disease)    Pt on Protonix daily  . Glaucoma   . Gout   . Headache(784.0)    otc meds prn  . Hyperlipidemia   . Hypertension   . Irritable bowel syndrome 11/19/2010  . Neuropathy   . Pneumonia YRS AGO  . Restless legs   . Shortness of breath    07/14/2018- uses  4 times a day    Past Surgical History:  Procedure Laterality Date  . BACK SURGERY    . CHOLECYSTECTOMY    . COLONOSCOPY    . ENDOMETRIAL ABLATION  10/2010  . EYE SURGERY    . HERNIA REPAIR     umbicial hernia  . JOINT REPLACEMENT Left 03/14/2019   Dr. Magnus IvanBlackman hip  . KNEE ARTHROSCOPY Left    [facility]07/[facility]  . KNEE CLOSED REDUCTION Right 12/06/2015   Procedure: CLOSED MANIPULATION RIGHT KNEE;  Surgeon: Kerrin ChampagneJames E Nitka, MD;  [facility];  Service: Orthopedics;  Laterality: Right;  . KNEE CLOSED REDUCTION Right 01/17/2016   Procedure: CLOSED MANIPULATION RIGHT KNEE;  Surgeon: Kerrin ChampagneJames E Nitka, MD;  Location: [facility];   Service: Orthopedics;  Laterality: Right;  . KNEE JOINT MANIPULATION Right 12/06/2015  . LACRIMAL TUBE INSERTION Bilateral 03/01/2019   Procedure: LACRIMAL TUBE INSERTION;  Surgeon: Aura CampsSpencer, Michael, MD;  [facility];  Service: Ophthalmology;  Laterality: Bilateral;  . LACRIMAL TUBE REMOVAL Bilateral 05/03/2019   Procedure: BILATERAL NASOLACRIMAL DUCT PROBING, IIRIGATION AND TUBE REMOVAL BOTH EYES;  Surgeon: Aura CampsSpencer, Michael, MD;  Location: [facility];  Service: Ophthalmology;  Laterality: Bilateral;  . LUMBAR DISC SURGERY  06/03/2015   L 2  L3 L4 L5   . LUMBAR LAMINECTOMY/DECOMPRESSION MICRODISCECTOMY N/A 06/03/2015   Procedure: Bilateral lateral recess decompression L2-3, L3-4, L4-5;  Surgeon: Kerrin ChampagneJames E Nitka, MD;  [facility];  Service: Orthopedics;  Laterality: N/A;  . LUMBAR LAMINECTOMY/DECOMPRESSION MICRODISCECTOMY N/A 10/02/2016   Procedure: Right L5-S1 Lateral Recess Decompression  microdiscectomy;  Surgeon: Kerrin ChampagneNitka, James E, MD;  [facility];  Service: Orthopedics;  Laterality: N/A;  . LUMBAR LAMINECTOMY/DECOMPRESSION MICRODISCECTOMY N/A 07/15/2018   Procedure: LEFT L3-4 MICRODISCECTOMY;  Surgeon: Kerrin ChampagneNitka, James E, MD;  [facility];  Service: Orthopedics;  Laterality: N/A;  . svd      x 2  . TEAR DUCT PROBING  Bilateral 03/01/2019   Procedure: TEAR DUCT PROBING WITH IRRIGATION;  Surgeon: Aura Camps, MD;  [facility];  Service: Ophthalmology;  Laterality: Bilateral;  . TOTAL HIP ARTHROPLASTY Left 03/14/2019   Procedure: LEFT TOTAL HIP ARTHROPLASTY ANTERIOR APPROACH;  Surgeon: [name], MD;  [facility];  Service: Orthopedics;  Laterality: Left;  . TOTAL KNEE ARTHROPLASTY Right 09/06/2015   Procedure: RIGHT TOTAL KNEE ARTHROPLASTY;  Surgeon: Kerrin Champagne, MD;  [facility];  Service: Orthopedics;  Laterality: Right;  . TUBAL LIGATION    . UPPER GASTROINTESTINAL ENDOSCOPY  04/28/11    There were no  vitals filed for this visit.    Subjective Assessment - 09/13/19 0803    Subjective Pt reports trouble with back spasms over the past year increasing over the past few months. Pt reports some radiating pain in BLE to posterior knees. Pt states that prolonged sitting, walking, and bending over are aggravating factors. Pt states she has had difficulty getting up from low chairs.    Pertinent History multiple back surgeries, TKR's, COPD, THA    Limitations Lifting;Standing;Walking;House hold activities    How long can you sit comfortably? 30 min    How long can you walk comfortably? "to the end of the driveway"    Diagnostic tests xrays    Patient Stated Goals reduce pain    Currently in Pain? Yes    Pain Score 7     Pain Location Back    Pain Orientation Lower    Pain Descriptors / Indicators Sharp    Pain Type Chronic pain    Pain Radiating Towards c/o some radiating pain in B posterior knee    Pain Onset More than a month ago    Pain Frequency Intermittent    Aggravating Factors  activity, standing, prolonged sitting, bending    Pain Relieving Factors rest, heating pad    Effect of Pain on Daily Activities limits ADLs              [facility] PT Assessment - 09/13/19 0001      Assessment   Medical Diagnosis LBP    Referring Provider (PT) [name]    Next MD Visit 10/12/2019    Prior Therapy PT for knee, back, hip      Precautions   Precautions None      Restrictions   Weight Bearing Restrictions No      Balance Screen   Has the patient fallen in the past 6 months No    Has the patient had a decrease in activity level because of a fear of falling?  No    Is the patient reluctant to leave their home because of a fear of falling?  No      Home Environment   Additional Comments has stairs, does some housework. states goes up/down stairs sideways holding onto rail      Prior Function   Level of Independence Independent   occasionally uses cane or rollator   Vocation On disability     Leisure no exercise      Sensation   Light Touch Appears Intact      Functional Tests   Functional tests Sit to Stand      Sit to Stand   Comments slow to rise; fatigues quickly      Posture/Postural Control   Posture/Postural Control Postural limitations    Postural Limitations Rounded Shoulders;Forward head      ROM / Strength   AROM /  PROM / Strength AROM;Strength      AROM   Overall AROM Comments lumbar rom decreased 50%; thigh walking to rise with lumbar flexion; reports pain with lumbar extension      Strength   Overall Strength Comments 4+/5 BLE strength except hip abd/ext 4/5      Flexibility   Soft Tissue Assessment /Muscle Length yes    Hamstrings tight    Quadriceps very tight    ITB tight    Piriformis tight      Palpation   Palpation comment stiff and tender in lumbar paraspinals       Special Tests    Special Tests Lumbar    Lumbar Tests Straight Leg Raise      Straight Leg Raise   Comment painful at ~70 deg B      Transfers   Five time sit to stand comments  >15 sec                      Objective measurements completed on examination: See above findings.       [facility] Adult PT Treatment/Exercise - 09/13/19 0001      Exercises   Exercises Lumbar      Lumbar Exercises: Stretches   Active Hamstring Stretch Right;Left;1 rep;30 seconds    Lower Trunk Rotation 5 reps;10 seconds    Other Lumbar Stretch Exercise supine butterfly stretch x30 sec      Lumbar Exercises: Supine   Bridge 10 reps;3 seconds      Moist Heat Therapy   Number Minutes Moist Heat 10 Minutes    Moist Heat Location Lumbar Spine      Electrical Stimulation   Electrical Stimulation Location low back     Electrical Stimulation Action IFC    Electrical Stimulation Parameters supine    Electrical Stimulation Goals Pain                  PT Education - 09/13/19 0836    Education Details Pt educated on HEP and POC    Person(s) Educated Patient     Methods Explanation;Demonstration;Handout    Comprehension Verbalized understanding;Returned demonstration            PT Short Term Goals - 09/13/19 0846      PT SHORT TERM GOAL #1   Title I with initial HEP    Time 2    Period Weeks    Status New    Target Date 09/27/19             PT Long Term Goals - 09/13/19 0846      PT LONG TERM GOAL #1   Title I with advanced HEP    Time 12    Period Weeks    Status New    Target Date 12/06/19      PT LONG TERM GOAL #2   Title increase lumbar ROM 25%    Time 12    Period Weeks    Status New    Target Date 12/06/19      PT LONG TERM GOAL #3   Title decreased pain 50% with ADLs    Time 12    Period Weeks    Status New    Target Date 12/06/19                  Plan - 09/13/19 9629    Clinical Impression Statement Pt presents to clinic with reports of chronic LBP with back spasms increasing  over the past few months. Pt reports occasional radiating pain into BLE down to posterior knee. Pt has hx of R TKA, L THA, and lumbar laminectomy 2020. Pt demos pain with lumbar flexion/extension, difficulty rising from lumbar flexion, difficulty rising to stand from low surfaces, tender to palpation lumbar paraspinals, and decreased LE flexibility/strength. Pt would benefit from skilled PT to address the above impairments.    Personal Factors and Comorbidities Fitness;Past/Current Experience;Comorbidity 3+;Social Background;Education;Transportation    Comorbidities Gout, 2 past back surgeries, TKR's, COPD    Examination-Activity Limitations Bend;Stairs;Squat;Stand;Lift;Transfers;Locomotion Level    Stability/Clinical Decision Making Evolving/Moderate complexity    Clinical Decision Making Moderate    Rehab Potential Good    PT Frequency 1x / week    PT Duration 12 weeks    PT Treatment/Interventions ADLs/Self Care Home Management;Cryotherapy;Electrical Stimulation;Moist Heat;Traction;Ultrasound;Functional mobility  training;Patient/family education;Therapeutic exercise;Therapeutic activities;Manual techniques;Dry needling;Neuromuscular re-education;Passive range of motion;Iontophoresis 4mg /ml Dexamethasone    PT Next Visit Plan core stab, lumbar ex's, LE strength/flexibility, manual/modalities as indicated    Consulted and Agree with Plan of Care Patient           Patient will benefit from skilled therapeutic intervention in order to improve the following deficits and impairments:  Abnormal gait, Decreased range of motion, Difficulty walking, Increased muscle spasms, Decreased endurance, Cardiopulmonary status limiting activity, Decreased activity tolerance, Pain, Improper body mechanics, Impaired flexibility, Decreased strength, Postural dysfunction  Visit Diagnosis: Difficulty in walking, not elsewhere classified  Acute bilateral low back pain with bilateral sciatica  Muscle spasm of back     Problem List Patient Active Problem List   Diagnosis Date Noted  . Centrilobular emphysema (HCC) 08/10/2019  . Incidental lung nodule, > 43mm and < 58mm 08/10/2019  . OSA on CPAP 08/10/2019  . Dyspnea on exertion 08/02/2019  . Hyperlipidemia 08/02/2019  . Lumbar radiculopathy 04/24/2019  . 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  . Post laminectomy syndrome 02/23/2019  . Abnormality of gait 02/23/2019  . 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 Unilateral primary osteoarthritis, left hip 12/28/2018  . Lesion of skin of left ear 12/26/2018  . Primary osteoarthritis of left hip 12/02/2018  . Iron deficiency anemia 10/16/2018  . Chronic pain syndrome 09/08/2018  . Chronic pain of right knee 08/11/2018  . Status post lumbar laminectomy 07/15/2018  . Peripheral arterial disease (HCC) 04/05/2018  . Moderate persistent asthma without complication 06/29/2017  . Environmental and seasonal allergies 06/29/2017  . Controlled type 2 diabetes mellitus with diabetic polyneuropathy, without long-term current  use of insulin (HCC) 06/29/2017  . Perennial allergic rhinitis 04/08/2017  . Sensorineural hearing loss (SNHL), bilateral 04/08/2017  . Chronic pansinusitis 03/25/2017  . Eustachian tube dysfunction, bilateral 03/25/2017  . 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 Herniation of lumbar intervertebral disc with radiculopathy 10/02/2016    Class: Chronic  . Alopecia areata 08/19/2016  . Chondromalacia of both patellae 06/03/2015    Class: Chronic  . Spinal stenosis, lumbar region, with neurogenic claudication 06/03/2015  . Tobacco use disorder 04/25/2015  . DJD (degenerative joint disease) of knee 01/04/2015  . Hemorrhoid 11/14/2014  . Gout of big toe 07/19/2014  . Essential hypertension 08/14/2013  . Gastroesophageal reflux disease without esophagitis 08/14/2013  . COPD (chronic obstructive pulmonary disease) (HCC) 04/17/2011   04/19/2011, PT, DPT [facility] Indiyah Paone 09/13/2019, 8:57 AM  [facility] [address] [place], [place], [place] Phone: [phone]   Fax:  [phone]  Name: [facility]  MRN: [id number] Date of Birth: [birth year]

## 2019-09-13 NOTE — Patient Instructions (Signed)
Access Code: [email or web address] URL: [email or web address] Date: 09/13/2019 Prepared by: [facility]  Exercises Supine Lower Trunk Rotation - 1 x daily - 7 x weekly - 3 sets - 10 reps - 5 sec hold Supine Bridge - 1 x daily - 7 x weekly - 3 sets - 10 reps - 3 sec hold Supine Butterfly Groin Stretch - 1 x daily - 7 x weekly - 3 sets - 2 reps - 30 sec hold Seated Table Hamstring Stretch - 1 x daily - 7 x weekly - 3 sets - 2 reps - 30 sec hold

## 2019-09-14 NOTE — Telephone Encounter (Signed)
Dr. [name] did the note and I have mailed it to the patient at her request

## 2019-09-18 DIAGNOSIS — J301 Allergic rhinitis due to pollen: Secondary | ICD-10-CM | POA: Diagnosis not present

## 2019-09-18 DIAGNOSIS — J3081 Allergic rhinitis due to animal (cat) (dog) hair and dander: Secondary | ICD-10-CM | POA: Diagnosis not present

## 2019-09-18 DIAGNOSIS — J3089 Other allergic rhinitis: Secondary | ICD-10-CM | POA: Diagnosis not present

## 2019-09-19 ENCOUNTER — Other Ambulatory Visit: Payer: Self-pay | Admitting: Specialist

## 2019-09-19 ENCOUNTER — Other Ambulatory Visit: Payer: Self-pay | Admitting: Internal Medicine

## 2019-09-19 ENCOUNTER — Other Ambulatory Visit: Payer: Self-pay | Admitting: Physical Medicine & Rehabilitation

## 2019-09-20 DIAGNOSIS — F331 Major depressive disorder, recurrent, moderate: Secondary | ICD-10-CM | POA: Diagnosis not present

## 2019-09-21 ENCOUNTER — Ambulatory Visit: Payer: Medicaid Other | Admitting: Physical Therapy

## 2019-09-25 ENCOUNTER — Ambulatory Visit (INDEPENDENT_AMBULATORY_CARE_PROVIDER_SITE_OTHER): Payer: Medicaid Other

## 2019-09-25 ENCOUNTER — Encounter: Payer: Self-pay | Admitting: Orthopaedic Surgery

## 2019-09-25 ENCOUNTER — Ambulatory Visit (INDEPENDENT_AMBULATORY_CARE_PROVIDER_SITE_OTHER): Payer: Medicaid Other | Admitting: Orthopaedic Surgery

## 2019-09-25 DIAGNOSIS — Z96642 Presence of left artificial hip joint: Secondary | ICD-10-CM

## 2019-09-25 NOTE — Progress Notes (Signed)
Office Visit Note   Patient: Stacy [name]           Date of Birth: 01/18/1961           MRN: [id number] Visit Date: 09/25/2019              Requested by: Marcine Matar, MD [address] [place],  [place] [address] PCP: Marcine Matar, MD   Assessment & Plan: Visit Diagnoses:  1. History of left hip replacement     Plan: At this point follow-up for her left hip can be as needed.  I had a long and thorough discussion with her about the things that we need to bring her back for the left hip.  Of her right hip starts bothering her she will let us know.  All questions and concerns were answered and addressed.  Follow-Up Instructions: Return if symptoms worsen or fail to improve.   Orders:  Orders Placed This Encounter  Procedures  . XR HIP UNILAT W OR W/O PELVIS 1V LEFT   No orders of the defined types were placed in this encounter.     Procedures: No procedures performed   Clinical Data: No additional findings.   Subjective: Chief Complaint  Patient presents with  . Left Hip - Follow-up  Patient is a very pleasant 59 year old female who is now over 6 months status post a left total hip arthroplasty.  She has had no issues it relates to her left hip.  She denies any symptoms right now of her right hip.  She said no acute change in medical status.  She is followed by Dr. [name] for her spine.  HPI  Review of Systems She currently denies any headache, chest pain, shortness of breath, fever, chills, nausea, vomiting  Objective: Vital Signs: LMP 09/01/2010   Physical Exam She is alert and orient x3 and in no acute distress Ortho Exam Examination of her left operative hip shows it moves smoothly and fluidly.  Her right hip also moves smoothly and fluidly. Specialty Comments:  No specialty comments available.  Imaging: XR HIP UNILAT W OR W/O PELVIS 1V LEFT  Result Date: 09/25/2019 An AP pelvis and lateral left hip shows a well-seated total hip  arthroplasty with no complicating features.    PMFS History: Patient Active Problem List   Diagnosis Date Noted  . Centrilobular emphysema (HCC) 08/10/2019  . Incidental lung nodule, > 29mm and < 13mm 08/10/2019  . OSA on CPAP 08/10/2019  . Dyspnea on exertion 08/02/2019  . Hyperlipidemia 08/02/2019  . Lumbar radiculopathy 04/24/2019  . 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  . Post laminectomy syndrome 02/23/2019  . Abnormality of gait 02/23/2019  . 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 Unilateral primary osteoarthritis, left hip 12/28/2018  . Lesion of skin of left ear 12/26/2018  . Primary osteoarthritis of left hip 12/02/2018  . Iron deficiency anemia 10/16/2018  . Chronic pain syndrome 09/08/2018  . Chronic pain of right knee 08/11/2018  . Status post lumbar laminectomy 07/15/2018  . Peripheral arterial disease (HCC) 04/05/2018  . Moderate persistent asthma without complication 06/29/2017  . Environmental and seasonal allergies 06/29/2017  . Controlled type 2 diabetes mellitus with diabetic polyneuropathy, without long-term current use of insulin (HCC) 06/29/2017  . Perennial allergic rhinitis 04/08/2017  . Sensorineural hearing loss (SNHL), bilateral 04/08/2017  . Chronic pansinusitis 03/25/2017  . Eustachian tube dysfunction, bilateral 03/25/2017  . 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 Herniation of lumbar intervertebral disc  with radiculopathy 10/02/2016    Class: Chronic  . Alopecia areata 08/19/2016  . Chondromalacia of both patellae 06/03/2015    Class: Chronic  . Spinal stenosis, lumbar region, with neurogenic claudication 06/03/2015  . Tobacco use disorder 04/25/2015  . DJD (degenerative joint disease) of knee 01/04/2015  . Hemorrhoid 11/14/2014  . Gout of big toe 07/19/2014  . Essential hypertension 08/14/2013  . Gastroesophageal reflux disease without esophagitis 08/14/2013  . COPD (chronic obstructive pulmonary disease) (HCC) 04/17/2011   Past Medical  History:  Diagnosis Date  . Allergy    Shellfish, cleaning products  . Anxiety   . Arthritis   . 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 Asthma   . COPD (chronic obstructive pulmonary disease) (HCC)   . Depression   . Diabetes mellitus    Type II  . GERD (gastroesophageal reflux disease)    Pt on Protonix daily  . Glaucoma   . Gout   . Headache(784.0)    otc meds prn  . Hyperlipidemia   . Hypertension   . Irritable bowel syndrome 11/19/2010  . Neuropathy   . Pneumonia YRS AGO  . Restless legs   . Shortness of breath    07/14/2018- uses  4 times a day    Family History  Problem Relation Age of Onset  . Hypertension Father   . Cancer Father   . Heart disease Mother   . Asthma Son        had as a child  . Heart disease Sister   . Breast cancer Sister   . Hypertension Brother     Past Surgical History:  Procedure Laterality Date  . BACK SURGERY    . CHOLECYSTECTOMY    . COLONOSCOPY    . ENDOMETRIAL ABLATION  10/2010  . EYE SURGERY    . HERNIA REPAIR     umbicial hernia  . JOINT REPLACEMENT Left 03/14/2019   Dr. Magnus Ivan hip  . KNEE ARTHROSCOPY Left    06/07/2017 Dr. August Saucer of [facility]  . KNEE CLOSED REDUCTION Right 12/06/2015   Procedure: CLOSED MANIPULATION RIGHT KNEE;  Surgeon: Kerrin Champagne, MD;  [facility];  Service: Orthopedics;  Laterality: Right;  . KNEE CLOSED REDUCTION Right 01/17/2016   Procedure: CLOSED MANIPULATION RIGHT KNEE;  Surgeon: Kerrin Champagne, MD;  [facility]: [facility];  Service: Orthopedics;  Laterality: Right;  . KNEE JOINT MANIPULATION Right 12/06/2015  . LACRIMAL TUBE INSERTION Bilateral 03/01/2019   Procedure: LACRIMAL TUBE INSERTION;  Surgeon: Aura Camps, MD;  Location[facility];  Service: Ophthalmology;  Laterality: Bilateral;  . LACRIMAL TUBE REMOVAL Bilateral 05/03/2019   Procedure: BILATERAL NASOLACRIMAL DUCT PROBING, IIRIGATION AND TUBE REMOVAL BOTH EYES;  Surgeon: Aura Camps, MD;   Location: [facility];  Service: Ophthalmology;  Laterality: Bilateral;  . LUMBAR DISC SURGERY  06/03/2015   L 2  L3 L4 L5   . LUMBAR LAMINECTOMY/DECOMPRESSION MICRODISCECTOMY N/A 06/03/2015   Procedure: Bilateral lateral recess decompression L2-3, L3-4, L4-5;  Surgeon: Kerrin Champagne, MD;  [facility];  Service: Orthopedics;  Laterality: N/A;  . LUMBAR LAMINECTOMY/DECOMPRESSION MICRODISCECTOMY N/A 10/02/2016   Procedure: Right L5-S1 Lateral Recess Decompression  microdiscectomy;  Surgeon: Kerrin Champagne, MD;  Location: [facility];  Service: Orthopedics;  Laterality: N/A;  . LUMBAR LAMINECTOMY/DECOMPRESSION MICRODISCECTOMY N/A 07/15/2018   Procedure: LEFT L3-4 MICRODISCECTOMY;  Surgeon: Kerrin Champagne, MD;  [facility];  Service: Orthopedics;  Laterality: N/A;  .  svd      x 2  . TEAR DUCT PROBING Bilateral 03/01/2019   Procedure: TEAR DUCT PROBING WITH IRRIGATION;  Surgeon: Aura Camps, MD;  Location: [facility];  Service: Ophthalmology;  Laterality: Bilateral;  . TOTAL HIP ARTHROPLASTY Left 03/14/2019   Procedure: LEFT TOTAL HIP ARTHROPLASTY ANTERIOR APPROACH;  Surgeon: [name], MD;  [facility];  Service: Orthopedics;  Laterality: Left;  . TOTAL KNEE ARTHROPLASTY Right 09/06/2015   Procedure: RIGHT TOTAL KNEE ARTHROPLASTY;  Surgeon: Kerrin Champagne, MD;  Location: [facility];  Service: Orthopedics;  Laterality: Right;  . TUBAL LIGATION    . UPPER GASTROINTESTINAL ENDOSCOPY  04/28/11   Social History   Occupational History  . Occupation: unemployed    Associate Professor: UNEMPLOYED  Tobacco Use  . Smoking status: Current Some Day Smoker    Packs/day: 0.25    Years: 32.00    Pack years: 8.00    Types: Cigarettes  . Smokeless tobacco: Never Used  . Tobacco comment: DOWN TO 3 PER DAY  Vaping Use  . Vaping Use: Never used  Substance and Sexual Activity  . Alcohol use: No  . Drug use: No  . Sexual activity: Yes    Birth control/protection: Surgical,  Post-menopausal    Comment: tubal ligation

## 2019-09-26 DIAGNOSIS — J301 Allergic rhinitis due to pollen: Secondary | ICD-10-CM | POA: Diagnosis not present

## 2019-09-26 DIAGNOSIS — J3089 Other allergic rhinitis: Secondary | ICD-10-CM | POA: Diagnosis not present

## 2019-09-26 DIAGNOSIS — J3081 Allergic rhinitis due to animal (cat) (dog) hair and dander: Secondary | ICD-10-CM | POA: Diagnosis not present

## 2019-09-27 DIAGNOSIS — F331 Major depressive disorder, recurrent, moderate: Secondary | ICD-10-CM | POA: Diagnosis not present

## 2019-09-28 ENCOUNTER — Encounter: Payer: Self-pay | Admitting: Physical Therapy

## 2019-09-28 ENCOUNTER — Other Ambulatory Visit: Payer: Self-pay

## 2019-09-28 ENCOUNTER — Ambulatory Visit: Payer: Medicaid Other | Admitting: Physical Therapy

## 2019-09-28 DIAGNOSIS — M62830 Muscle spasm of back: Secondary | ICD-10-CM

## 2019-09-28 DIAGNOSIS — R262 Difficulty in walking, not elsewhere classified: Secondary | ICD-10-CM

## 2019-09-28 DIAGNOSIS — M5442 Lumbago with sciatica, left side: Secondary | ICD-10-CM

## 2019-09-28 NOTE — Therapy (Signed)
[name] [name] [place], [place], [address] Phone: [phone]   Fax:  [phone]  Physical Therapy Treatment  Patient Details  Name: Stacy [name] MRN: [id number] Date of Birth: 1960-08-02 Referring Provider (PT): [name]   Encounter Date: 09/28/2019   PT End of Session - 09/28/19 0837    Visit Number 2    Date for PT Re-Evaluation 11/13/19    Authorization Type Medicaid    PT Start Time 0758    PT Stop Time 0849    PT Time Calculation (min) 51 min    Activity Tolerance Patient tolerated treatment well    Behavior During Therapy [name] for tasks assessed/performed           Past Medical History:  Diagnosis Date  . Allergy    Shellfish, cleaning products  . Anxiety   . Arthritis   . 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 Asthma   . COPD (chronic obstructive pulmonary disease) (HCC)   . Depression   . Diabetes mellitus    Type II  . GERD (gastroesophageal reflux disease)    Pt on Protonix daily  . Glaucoma   . Gout   . Headache(784.0)    otc meds prn  . Hyperlipidemia   . Hypertension   . Irritable bowel syndrome 11/19/2010  . Neuropathy   . Pneumonia YRS AGO  . Restless legs   . Shortness of breath    07/14/2018- uses  4 times a day    Past Surgical History:  Procedure Laterality Date  . BACK SURGERY    . CHOLECYSTECTOMY    . COLONOSCOPY    . ENDOMETRIAL ABLATION  10/2010  . EYE SURGERY    . HERNIA REPAIR     umbicial hernia  . JOINT REPLACEMENT Left 03/14/2019   Dr. Magnus Ivan hip  . KNEE ARTHROSCOPY Left    06/07/2017 Dr. August Saucer of [facility] Rubens  . KNEE CLOSED REDUCTION Right 12/06/2015   Procedure: CLOSED MANIPULATION RIGHT KNEE;  Surgeon: Kerrin Champagne, MD;  [facility];  Service: Orthopedics;  Laterality: Right;  . KNEE CLOSED REDUCTION Right 01/17/2016   Procedure: CLOSED MANIPULATION RIGHT KNEE;  Surgeon: Kerrin Champagne, MD;  [facility];   Service: Orthopedics;  Laterality: Right;  . KNEE JOINT MANIPULATION Right 12/06/2015  . LACRIMAL TUBE INSERTION Bilateral 03/01/2019   Procedure: LACRIMAL TUBE INSERTION;  Surgeon: Aura Camps, MD;  Location[facility];  Service: Ophthalmology;  Laterality: Bilateral;  . LACRIMAL TUBE REMOVAL Bilateral 05/03/2019   Procedure: BILATERAL NASOLACRIMAL DUCT PROBING, IIRIGATION AND TUBE REMOVAL BOTH EYES;  Surgeon: Aura Camps, MD;  Location: [facility];  Service: Ophthalmology;  Laterality: Bilateral;  . LUMBAR DISC SURGERY  06/03/2015   L 2  L3 L4 L5   . LUMBAR LAMINECTOMY/DECOMPRESSION MICRODISCECTOMY N/A 06/03/2015   Procedure: Bilateral lateral recess decompression L2-3, L3-4, L4-5;  Surgeon: Kerrin Champagne, MD;  [facility];  Service: Orthopedics;  Laterality: N/A;  . LUMBAR LAMINECTOMY/DECOMPRESSION MICRODISCECTOMY N/A 10/02/2016   Procedure: Right L5-S1 Lateral Recess Decompression  microdiscectomy;  Surgeon: Kerrin Champagne, MD;  Location: [facility] OR;  Service: Orthopedics;  Laterality: N/A;  . LUMBAR LAMINECTOMY/DECOMPRESSION MICRODISCECTOMY N/A 07/15/2018   Procedure: LEFT L3-4 MICRODISCECTOMY;  Surgeon: Kerrin Champagne, MD;  [facility];  Service: Orthopedics;  Laterality: N/A;  . svd      x 2  . TEAR DUCT PROBING  Bilateral 03/01/2019   Procedure: TEAR DUCT PROBING WITH IRRIGATION;  Surgeon: Aura Camps, MD;  Location: [facility];  Service: Ophthalmology;  Laterality: Bilateral;  . TOTAL HIP ARTHROPLASTY Left 03/14/2019   Procedure: LEFT TOTAL HIP ARTHROPLASTY ANTERIOR APPROACH;  Surgeon: [name], MD;  [facility];  Service: Orthopedics;  Laterality: Left;  . TOTAL KNEE ARTHROPLASTY Right 09/06/2015   Procedure: RIGHT TOTAL KNEE ARTHROPLASTY;  Surgeon: Kerrin Champagne, MD;  [facility];  Service: Orthopedics;  Laterality: Right;  . TUBAL LIGATION    . UPPER GASTROINTESTINAL ENDOSCOPY  04/28/11    There were no  vitals filed for this visit.   Subjective Assessment - 09/28/19 0811    Subjective Back is killing me    Currently in Pain? Yes    Pain Score 8     Pain Location Back                             [facility] Adult PT Treatment/Exercise - 09/28/19 0001      Lumbar Exercises: Aerobic   Nustep L4 x 7 min       Lumbar Exercises: Machines for Strengthening   Cybex Knee Extension 5lb 2x10     Cybex Knee Flexion 20lb 2x10       Lumbar Exercises: Standing   Shoulder Extension Theraband;20 reps;Both    Theraband Level (Shoulder Extension) Level 3 (Green)      Lumbar Exercises: Seated   Sit to Stand 10 reps   x2 from elevated UBE      Lumbar Exercises: Supine   Bridge 10 reps;3 seconds    Other Supine Lumbar Exercises LE on Pball small bridges, K2C, Oblq       Moist Heat Therapy   Number Minutes Moist Heat 12 Minutes    Moist Heat Location Lumbar Spine      Electrical Stimulation   Electrical Stimulation Location low back     Electrical Stimulation Action IFC    Electrical Stimulation Parameters supine, to pt tolerance    Electrical Stimulation Goals Pain                    PT Short Term Goals - 09/13/19 0846      PT SHORT TERM GOAL #1   Title I with initial HEP    Time 2    Period Weeks    Status New    Target Date 09/27/19             PT Long Term Goals - 09/13/19 0846      PT LONG TERM GOAL #1   Title I with advanced HEP    Time 12    Period Weeks    Status New    Target Date 12/06/19      PT LONG TERM GOAL #2   Title increase lumbar ROM 25%    Time 12    Period Weeks    Status New    Target Date 12/06/19      PT LONG TERM GOAL #3   Title decreased pain 50% with ADLs    Time 12    Period Weeks    Status New    Target Date 12/06/19                 Plan - 09/28/19 0837    Clinical Impression Statement Pt able to complete all the exercise interventions despite the high pain  ratting. Postural cues needed with standing  rows and extensions. Ambulates with rolator and decrease TKE with RLE. Cues to complete full ROM with seated leg curls and extensions. Some core weakness noted with supine interventions.    Personal Factors and Comorbidities Fitness;Past/Current Experience;Comorbidity 3+;Social Background;Education;Transportation    Comorbidities Gout, 2 past back surgeries, TKR's, COPD    Examination-Activity Limitations Bend;Stairs;Squat;Stand;Lift;Transfers;Locomotion Level    Stability/Clinical Decision Making Evolving/Moderate complexity    Rehab Potential Good    PT Frequency 1x / week    PT Treatment/Interventions ADLs/Self Care Home Management;Cryotherapy;Electrical Stimulation;Moist Heat;Traction;Ultrasound;Functional mobility training;Patient/family education;Therapeutic exercise;Therapeutic activities;Manual techniques;Dry needling;Neuromuscular re-education;Passive range of motion;Iontophoresis 4mg /ml Dexamethasone    PT Next Visit Plan core stab, lumbar ex's, LE strength/flexibility, manual/modalities as indicated           Patient will benefit from skilled therapeutic intervention in order to improve the following deficits and impairments:  Abnormal gait, Decreased range of motion, Difficulty walking, Increased muscle spasms, Decreased endurance, Cardiopulmonary status limiting activity, Decreased activity tolerance, Pain, Improper body mechanics, Impaired flexibility, Decreased strength, Postural dysfunction  Visit Diagnosis: Difficulty in walking, not elsewhere classified  Acute bilateral low back pain with bilateral sciatica  Muscle spasm of back     Problem List Patient Active Problem List   Diagnosis Date Noted  . Centrilobular emphysema (HCC) 08/10/2019  . Incidental lung nodule, > 30mm and < 44mm 08/10/2019  . OSA on CPAP 08/10/2019  . Dyspnea on exertion 08/02/2019  . Hyperlipidemia 08/02/2019  . Lumbar radiculopathy 04/24/2019  . Status post total replacement of left hip  03/14/2019  . Post laminectomy syndrome 02/23/2019  . Abnormality of gait 02/23/2019  . 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 Unilateral primary osteoarthritis, left hip 12/28/2018  . Lesion of skin of left ear 12/26/2018  . Primary osteoarthritis of left hip 12/02/2018  . Iron deficiency anemia 10/16/2018  . Chronic pain syndrome 09/08/2018  . Chronic pain of right knee 08/11/2018  . Status post lumbar laminectomy 07/15/2018  . Peripheral arterial disease (HCC) 04/05/2018  . Moderate persistent asthma without complication 06/29/2017  . Environmental and seasonal allergies 06/29/2017  . Controlled type 2 diabetes mellitus with diabetic polyneuropathy, without long-term current use of insulin (HCC) 06/29/2017  . Perennial allergic rhinitis 04/08/2017  . Sensorineural hearing loss (SNHL), bilateral 04/08/2017  . Chronic pansinusitis 03/25/2017  . Eustachian tube dysfunction, bilateral 03/25/2017  . 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 Herniation of lumbar intervertebral disc with radiculopathy 10/02/2016    Class: Chronic  . Alopecia areata 08/19/2016  . Chondromalacia of both patellae 06/03/2015    Class: Chronic  . Spinal stenosis, lumbar region, with neurogenic claudication 06/03/2015  . Tobacco use disorder 04/25/2015  . DJD (degenerative joint disease) of knee 01/04/2015  . Hemorrhoid 11/14/2014  . Gout of big toe 07/19/2014  . Essential hypertension 08/14/2013  . Gastroesophageal reflux disease without esophagitis 08/14/2013  . COPD (chronic obstructive pulmonary disease) (HCC) 04/17/2011    04/19/2011, PTA 09/28/2019, 8:41 AM  [facility] [address] [place], [place], [place] Phone: [phone]   Fax:  [phone]  Name: Stacy [name] MRN: [facility] Date of Birth: 1960-05-31

## 2019-09-29 DIAGNOSIS — F331 Major depressive disorder, recurrent, moderate: Secondary | ICD-10-CM | POA: Diagnosis not present

## 2019-10-02 DIAGNOSIS — J3089 Other allergic rhinitis: Secondary | ICD-10-CM | POA: Diagnosis not present

## 2019-10-02 DIAGNOSIS — J3081 Allergic rhinitis due to animal (cat) (dog) hair and dander: Secondary | ICD-10-CM | POA: Diagnosis not present

## 2019-10-02 DIAGNOSIS — H00032 Abscess of right lower eyelid: Secondary | ICD-10-CM | POA: Diagnosis not present

## 2019-10-02 DIAGNOSIS — J301 Allergic rhinitis due to pollen: Secondary | ICD-10-CM | POA: Diagnosis not present

## 2019-10-04 DIAGNOSIS — G4733 Obstructive sleep apnea (adult) (pediatric): Secondary | ICD-10-CM | POA: Diagnosis not present

## 2019-10-04 DIAGNOSIS — Z96642 Presence of left artificial hip joint: Secondary | ICD-10-CM | POA: Diagnosis not present

## 2019-10-04 DIAGNOSIS — R269 Unspecified abnormalities of gait and mobility: Secondary | ICD-10-CM | POA: Diagnosis not present

## 2019-10-04 DIAGNOSIS — J449 Chronic obstructive pulmonary disease, unspecified: Secondary | ICD-10-CM | POA: Diagnosis not present

## 2019-10-04 DIAGNOSIS — E1140 Type 2 diabetes mellitus with diabetic neuropathy, unspecified: Secondary | ICD-10-CM | POA: Diagnosis not present

## 2019-10-04 DIAGNOSIS — F331 Major depressive disorder, recurrent, moderate: Secondary | ICD-10-CM | POA: Diagnosis not present

## 2019-10-04 DIAGNOSIS — M5116 Intervertebral disc disorders with radiculopathy, lumbar region: Secondary | ICD-10-CM | POA: Diagnosis not present

## 2019-10-05 ENCOUNTER — Ambulatory Visit: Payer: Medicaid Other | Admitting: Physical Therapy

## 2019-10-06 DIAGNOSIS — L0291 Cutaneous abscess, unspecified: Secondary | ICD-10-CM | POA: Diagnosis not present

## 2019-10-10 ENCOUNTER — Ambulatory Visit: Payer: Medicaid Other | Admitting: Family

## 2019-10-10 DIAGNOSIS — J3089 Other allergic rhinitis: Secondary | ICD-10-CM | POA: Diagnosis not present

## 2019-10-10 DIAGNOSIS — J3081 Allergic rhinitis due to animal (cat) (dog) hair and dander: Secondary | ICD-10-CM | POA: Diagnosis not present

## 2019-10-10 DIAGNOSIS — J301 Allergic rhinitis due to pollen: Secondary | ICD-10-CM | POA: Diagnosis not present

## 2019-10-10 DIAGNOSIS — L0291 Cutaneous abscess, unspecified: Secondary | ICD-10-CM | POA: Diagnosis not present

## 2019-10-11 ENCOUNTER — Ambulatory Visit: Payer: Medicaid Other | Attending: Specialist | Admitting: Physical Therapy

## 2019-10-11 ENCOUNTER — Other Ambulatory Visit: Payer: Self-pay

## 2019-10-11 ENCOUNTER — Encounter: Payer: Self-pay | Admitting: Physical Therapy

## 2019-10-11 DIAGNOSIS — M5442 Lumbago with sciatica, left side: Secondary | ICD-10-CM | POA: Diagnosis not present

## 2019-10-11 DIAGNOSIS — M5441 Lumbago with sciatica, right side: Secondary | ICD-10-CM | POA: Insufficient documentation

## 2019-10-11 DIAGNOSIS — M62830 Muscle spasm of back: Secondary | ICD-10-CM | POA: Insufficient documentation

## 2019-10-11 DIAGNOSIS — R262 Difficulty in walking, not elsewhere classified: Secondary | ICD-10-CM | POA: Diagnosis not present

## 2019-10-11 DIAGNOSIS — F331 Major depressive disorder, recurrent, moderate: Secondary | ICD-10-CM | POA: Diagnosis not present

## 2019-10-11 NOTE — Therapy (Signed)
[facility] [address] [place], [place], [address] Phone: [phone]   Fax:  [phone]  Physical Therapy Treatment  Patient Details  Name: Stacy [name] [facility] MRN: [id number] Date of Birth: 1960-02-25 Referring Provider (PT): [name]   Encounter Date: 10/11/2019   PT End of Session - 10/11/19 0847    Visit Number 3    Date for PT Re-Evaluation 11/13/19    Authorization Type Medicaid    PT Start Time 0806    PT Stop Time 0847    PT Time Calculation (min) 41 min    Activity Tolerance Patient tolerated treatment well    Behavior During Therapy [facility] for tasks assessed/performed           Past Medical History:  Diagnosis Date  . Allergy    Shellfish, cleaning products  . Anxiety   . Arthritis   . 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 Asthma   . COPD (chronic obstructive pulmonary disease) (HCC)   . Depression   . Diabetes mellitus    Type II  . GERD (gastroesophageal reflux disease)    Pt on Protonix daily  . Glaucoma   . Gout   . Headache(784.0)    otc meds prn  . Hyperlipidemia   . Hypertension   . Irritable bowel syndrome 11/19/2010  . Neuropathy   . Pneumonia YRS AGO  . Restless legs   . Shortness of breath    07/14/2018- uses  4 times a day    Past Surgical History:  Procedure Laterality Date  . BACK SURGERY    . CHOLECYSTECTOMY    . COLONOSCOPY    . ENDOMETRIAL ABLATION  10/2010  . EYE SURGERY    . HERNIA REPAIR     umbicial hernia  . JOINT REPLACEMENT Left 03/14/2019   Dr. Magnus Ivan hip  . KNEE ARTHROSCOPY Left    06/07/2017 Dr. August Saucer of [place] Rubens  . KNEE CLOSED REDUCTION Right 12/06/2015   Procedure: CLOSED MANIPULATION RIGHT KNEE;  Surgeon: Kerrin Champagne, MD;  [facility];  Service: Orthopedics;  Laterality: Right;  . KNEE CLOSED REDUCTION Right 01/17/2016   Procedure: CLOSED MANIPULATION RIGHT KNEE;  Surgeon: Kerrin Champagne, MD;  [facility];   Service: Orthopedics;  Laterality: Right;  . KNEE JOINT MANIPULATION Right 12/06/2015  . LACRIMAL TUBE INSERTION Bilateral 03/01/2019   Procedure: LACRIMAL TUBE INSERTION;  Surgeon: Aura Camps, MD;  Location: [facility];  Service: Ophthalmology;  Laterality: Bilateral;  . LACRIMAL TUBE REMOVAL Bilateral 05/03/2019   Procedure: BILATERAL NASOLACRIMAL DUCT PROBING, IIRIGATION AND TUBE REMOVAL BOTH EYES;  Surgeon: Aura Camps, MD;  Location: [facility];  Service: Ophthalmology;  Laterality: Bilateral;  . LUMBAR DISC SURGERY  06/03/2015   L 2  L3 L4 L5   . LUMBAR LAMINECTOMY/DECOMPRESSION MICRODISCECTOMY N/A 06/03/2015   Procedure: Bilateral lateral recess decompression L2-3, L3-4, L4-5;  Surgeon: Kerrin Champagne, MD;  [facility];  Service: Orthopedics;  Laterality: N/A;  . LUMBAR LAMINECTOMY/DECOMPRESSION MICRODISCECTOMY N/A 10/02/2016   Procedure: Right L5-S1 Lateral Recess Decompression  microdiscectomy;  Surgeon: Kerrin Champagne, MD;  Location: [facility] OR;  Service: Orthopedics;  Laterality: N/A;  . LUMBAR LAMINECTOMY/DECOMPRESSION MICRODISCECTOMY N/A 07/15/2018   Procedure: LEFT L3-4 MICRODISCECTOMY;  Surgeon: Kerrin Champagne, MD;  [facility];  Service: Orthopedics;  Laterality: N/A;  . svd      x 2  . TEAR DUCT PROBING  Bilateral 03/01/2019   Procedure: TEAR DUCT PROBING WITH IRRIGATION;  Surgeon: Aura Camps, MD;  Location: [facility];  Service: Ophthalmology;  Laterality: Bilateral;  . TOTAL HIP ARTHROPLASTY Left 03/14/2019   Procedure: LEFT TOTAL HIP ARTHROPLASTY ANTERIOR APPROACH;  Surgeon: [name], MD;  [facility];  Service: Orthopedics;  Laterality: Left;  . TOTAL KNEE ARTHROPLASTY Right 09/06/2015   Procedure: RIGHT TOTAL KNEE ARTHROPLASTY;  Surgeon: Kerrin Champagne, MD;  [facility];  Service: Orthopedics;  Laterality: Right;  . TUBAL LIGATION    . UPPER GASTROINTESTINAL ENDOSCOPY  04/28/11    There were no  vitals filed for this visit.   Subjective Assessment - 10/11/19 0809    Subjective Pt reports that her back is still the same. States that she has been very busy and has not had much time to do exercises.    Currently in Pain? Yes    Pain Score 8     Pain Location Back    Pain Orientation Lower                             [facility] Adult PT Treatment/Exercise - 10/11/19 0001      Self-Care   Self-Care Other Self-Care Comments    Other Self-Care Comments  pt requested education on ways to use home exercise ball for back; updated HEP to include ex's and demonstrated      Lumbar Exercises: Aerobic   Recumbent Bike x5 min    Nustep L5 x 7 min      Lumbar Exercises: Machines for Strengthening   Cybex Knee Extension 5lb 2x10     Cybex Knee Flexion 20lb 2x10     Other Lumbar Machine Exercise rows and lats 20# 2x10      Lumbar Exercises: Supine   Other Supine Lumbar Exercises LE on Pball small bridges, K2C, Oblq                    PT Short Term Goals - 10/11/19 0850      PT SHORT TERM GOAL #1   Title I with initial HEP    Status On-going             PT Long Term Goals - 10/11/19 0850      PT LONG TERM GOAL #1   Title I with advanced HEP    Status On-going      PT LONG TERM GOAL #2   Title increase lumbar ROM 25%    Status On-going      PT LONG TERM GOAL #3   Title decreased pain 50% with ADLs    Status On-going                 Plan - 10/11/19 0848    Clinical Impression Statement Pt reports she has had difficulty attending appts d/t high number of doctor appts lately. States that she would like to return to doctor tomorrow and then call back to decide if she wants more PT appts. Updated pt HEP per request to include ex's with exercise ball and reprinted original HEP. Pt did well with ex's today with no increased LBP. Still demos sig stiffness in lumbar spine and LE.    PT Treatment/Interventions ADLs/Self Care Home  Management;Cryotherapy;Electrical Stimulation;Moist Heat;Traction;Ultrasound;Functional mobility training;Patient/family education;Therapeutic exercise;Therapeutic activities;Manual techniques;Dry needling;Neuromuscular re-education;Passive range of motion;Iontophoresis 4mg /ml Dexamethasone    PT Next Visit Plan core stab, lumbar ex's, LE strength/flexibility,  manual/modalities as indicated    Consulted and Agree with Plan of Care Patient           Patient will benefit from skilled therapeutic intervention in order to improve the following deficits and impairments:  Abnormal gait, Decreased range of motion, Difficulty walking, Increased muscle spasms, Decreased endurance, Cardiopulmonary status limiting activity, Decreased activity tolerance, Pain, Improper body mechanics, Impaired flexibility, Decreased strength, Postural dysfunction  Visit Diagnosis: Difficulty in walking, not elsewhere classified  Acute bilateral low back pain with bilateral sciatica  Muscle spasm of back     Problem List Patient Active Problem List   Diagnosis Date Noted  . Centrilobular emphysema (HCC) 08/10/2019  . Incidental lung nodule, > 79mm and < 86mm 08/10/2019  . OSA on CPAP 08/10/2019  . Dyspnea on exertion 08/02/2019  . Hyperlipidemia 08/02/2019  . Lumbar radiculopathy 04/24/2019  . 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  . Post laminectomy syndrome 02/23/2019  . Abnormality of gait 02/23/2019  . 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 Unilateral primary osteoarthritis, left hip 12/28/2018  . Lesion of skin of left ear 12/26/2018  . Primary osteoarthritis of left hip 12/02/2018  . Iron deficiency anemia 10/16/2018  . Chronic pain syndrome 09/08/2018  . Chronic pain of right knee 08/11/2018  . Status post lumbar laminectomy 07/15/2018  . Peripheral arterial disease (HCC) 04/05/2018  . Moderate persistent asthma without complication 06/29/2017  . Environmental and seasonal allergies 06/29/2017   . Controlled type 2 diabetes mellitus with diabetic polyneuropathy, without long-term current use of insulin (HCC) 06/29/2017  . Perennial allergic rhinitis 04/08/2017  . Sensorineural hearing loss (SNHL), bilateral 04/08/2017  . Chronic pansinusitis 03/25/2017  . Eustachian tube dysfunction, bilateral 03/25/2017  . 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 Herniation of lumbar intervertebral disc with radiculopathy 10/02/2016    Class: Chronic  . Alopecia areata 08/19/2016  . Chondromalacia of both patellae 06/03/2015    Class: Chronic  . Spinal stenosis, lumbar region, with neurogenic claudication 06/03/2015  . Tobacco use disorder 04/25/2015  . DJD (degenerative joint disease) of knee 01/04/2015  . Hemorrhoid 11/14/2014  . Gout of big toe 07/19/2014  . Essential hypertension 08/14/2013  . Gastroesophageal reflux disease without esophagitis 08/14/2013  . COPD (chronic obstructive pulmonary disease) (HCC) 04/17/2011   Lysle Rubens, PT, DPT Maryanna Shape Berneda Piccininni 10/11/2019, 8:52 AM  [facility] [address] [place], [place], [address] Phone: [phone]   Fax:  [phone]  Name: Jaunice Mirza [facility] MRN: [id number] Date of Birth: 10-Feb-1960

## 2019-10-12 ENCOUNTER — Ambulatory Visit (INDEPENDENT_AMBULATORY_CARE_PROVIDER_SITE_OTHER): Payer: Medicaid Other | Admitting: Specialist

## 2019-10-12 ENCOUNTER — Other Ambulatory Visit: Payer: Self-pay

## 2019-10-12 ENCOUNTER — Encounter: Payer: Self-pay | Admitting: Specialist

## 2019-10-12 VITALS — BP 103/65 | HR 76 | Ht 66.0 in | Wt 175.0 lb

## 2019-10-12 DIAGNOSIS — M5137 Other intervertebral disc degeneration, lumbosacral region: Secondary | ICD-10-CM | POA: Diagnosis not present

## 2019-10-12 DIAGNOSIS — M4156 Other secondary scoliosis, lumbar region: Secondary | ICD-10-CM | POA: Diagnosis not present

## 2019-10-12 DIAGNOSIS — M48062 Spinal stenosis, lumbar region with neurogenic claudication: Secondary | ICD-10-CM

## 2019-10-12 DIAGNOSIS — M5417 Radiculopathy, lumbosacral region: Secondary | ICD-10-CM | POA: Diagnosis not present

## 2019-10-12 DIAGNOSIS — Z96642 Presence of left artificial hip joint: Secondary | ICD-10-CM

## 2019-10-12 MED ORDER — MELOXICAM 15 MG PO TABS: 15.0000 mg | ORAL_TABLET | Freq: Every day | ORAL | 3 refills | Status: DC

## 2019-10-12 MED ORDER — DICLOFENAC SODIUM 1 % EX GEL: CUTANEOUS | 3 refills | Status: DC

## 2019-10-12 MED ORDER — METHOCARBAMOL 750 MG PO TABS: 750.0000 mg | ORAL_TABLET | Freq: Three times a day (TID) | ORAL | 1 refills | Status: DC | PRN

## 2019-10-12 NOTE — Patient Instructions (Addendum)
Plan: Knee is suffering from osteoarthritis, only real proven treatments are Weight loss, NSIADs like meloxicam and exercise. Well padded shoes help. Ice the knee the is suffering from osteoarthritis, only real proven treatments are  Well padded shoes help. Ice the knee 2-3 times a day 15-20 mins at a time.-3 times a day 15-20 mins at a time. Hot showers in the AM.  Injection with steroid may be of benefit. Hemp CBD capsules, amazon.com 5,000-7,000 mg per bottle, 60 capsules per bottle, take one capsule twice a day. Cane in the left hand to use with left leg weight bearing. Follow-Up Instructions: No follow-ups on file. Avoid bending, stooping and avoid lifting weights greater than 10 lbs. Avoid prolong standing and walking. Avoid frequent bending and stooping  No lifting greater than 10 lbs. May use ice or moist heat for pain. Weight loss is of benefit. Handicap license is approved.

## 2019-10-12 NOTE — Progress Notes (Signed)
Office Visit Note   Patient: Stacy [name]           Date of Birth: May 09, 1960           MRN: [id number] Visit Date: 10/12/2019              Requested by: Marcine Matar, MD [address] [place],  [place] [address] PCP: Marcine Matar, MD   Assessment & Plan: Visit Diagnoses:  1. Other secondary scoliosis, lumbar region   2. Disc disease, degenerative, lumbar or lumbosacral   3. History of left hip replacement   4. Status post total replacement of left hip   5. Lumbosacral radiculopathy at L3   6. Spinal stenosis of lumbar region with neurogenic claudication     Plan: Knee is suffering from osteoarthritis, only real proven treatments are Weight loss, NSIADs like meloxicam and exercise. Well padded shoes help. Ice the knee the is suffering from osteoarthritis, only real proven treatments are  Well padded shoes help. Ice the knee 2-3 times a day 15-20 mins at a time.-3 times a day 15-20 mins at a time. Hot showers in the AM.  Injection with steroid may be of benefit. Hemp CBD capsules, amazon.com 5,000-7,000 mg per bottle, 60 capsules per bottle, take one capsule twice a day. Cane in the left hand to use with left leg weight bearing. Follow-Up Instructions: No follow-ups on file. Avoid bending, stooping and avoid lifting weights greater than 10 lbs. Avoid prolong standing and walking. Avoid frequent bending and stooping  No lifting greater than 10 lbs. May use ice or moist heat for pain. Weight loss is of benefit. Handicap license is approved.  Follow-Up Instructions: Return in about 3 months (around 01/11/2020).   Orders:  No orders of the defined types were placed in this encounter.  Meds ordered this encounter  Medications  . meloxicam (MOBIC) 15 MG tablet    Sig: Take 1 tablet (15 mg total) by mouth daily.    Dispense:  30 tablet    Refill:  3  . diclofenac Sodium (VOLTAREN) 1 % GEL    Sig: APPLY 4 GRAMS TOPICALLY 4 (FOUR) TIMES DAILY.     Dispense:  350 g    Refill:  3  . methocarbamol (ROBAXIN) 750 MG tablet    Sig: Take 1 tablet (750 mg total) by mouth every 8 (eight) hours as needed for muscle spasms.    Dispense:  40 tablet    Refill:  1      Procedures: No procedures performed   Clinical Data: No additional findings.   Subjective: Chief Complaint  Patient presents with  . Lower Back - Follow-up    59 year old female with right TKR she reports back pain with intermittant leg pain. Last MRI late July with L3-4, L2-3 and L4-5 DDD with foramenal stenosis severe at L3-4, moderate at L4-5 with modic DDD changes and mild at L2-3. Disc is bulging at L5-S1 without foramenal changes. No bowel or bladder difficulty.     Review of Systems  Constitutional: Negative.   HENT: Negative.   Eyes: Negative.   Respiratory: Negative.   Cardiovascular: Negative.   Gastrointestinal: Negative.   Endocrine: Negative.   Genitourinary: Negative.   Musculoskeletal: Positive for back pain and gait problem. Negative for arthralgias, joint swelling, myalgias, neck pain and neck stiffness.  Skin: Negative.   Allergic/Immunologic: Negative.  Negative for environmental allergies, food allergies and immunocompromised state.  Neurological: Negative for dizziness, tremors, seizures,  syncope, facial asymmetry, speech difficulty, weakness, light-headedness, numbness and headaches.  Hematological: Negative.  Negative for adenopathy. Does not bruise/bleed easily.  Psychiatric/Behavioral: Negative.  Negative for agitation, behavioral problems, confusion, decreased concentration, dysphoric mood, hallucinations, self-injury, sleep disturbance and suicidal ideas. The patient is not nervous/anxious and is not hyperactive.      Objective: Vital Signs: BP 103/65 (BP Location: Left Arm, Patient Position: Sitting)   Pulse 76   Ht 5\' 6"  (1.676 m)   Wt 175 lb (79.4 kg)   LMP 09/01/2010   BMI 28.25 kg/m   Physical Exam  Ortho  Exam  Specialty Comments:  No specialty comments available.  Imaging: No results found.   PMFS History: Patient Active Problem List   Diagnosis Date Noted  . Herniation of lumbar intervertebral disc with radiculopathy 10/02/2016    Priority: High    Class: Chronic  . Chondromalacia of both patellae 06/03/2015    Priority: High    Class: Chronic  . Centrilobular emphysema (HCC) 08/10/2019  . Incidental lung nodule, > 29mm and < 18mm 08/10/2019  . OSA on CPAP 08/10/2019  . Dyspnea on exertion 08/02/2019  . Hyperlipidemia 08/02/2019  . Lumbar radiculopathy 04/24/2019  . 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  . Post laminectomy syndrome 02/23/2019  . Abnormality of gait 02/23/2019  . 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 Unilateral primary osteoarthritis, left hip 12/28/2018  . Lesion of skin of left ear 12/26/2018  . Primary osteoarthritis of left hip 12/02/2018  . Iron deficiency anemia 10/16/2018  . Chronic pain syndrome 09/08/2018  . Chronic pain of right knee 08/11/2018  . Status post lumbar laminectomy 07/15/2018  . Peripheral arterial disease (HCC) 04/05/2018  . Moderate persistent asthma without complication 06/29/2017  . Environmental and seasonal allergies 06/29/2017  . Controlled type 2 diabetes mellitus with diabetic polyneuropathy, without long-term current use of insulin (HCC) 06/29/2017  . Perennial allergic rhinitis 04/08/2017  . Sensorineural hearing loss (SNHL), bilateral 04/08/2017  . Chronic pansinusitis 03/25/2017  . Eustachian tube dysfunction, bilateral 03/25/2017  . 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 Alopecia areata 08/19/2016  . Spinal stenosis, lumbar region, with neurogenic claudication 06/03/2015  . Tobacco use disorder 04/25/2015  . DJD (degenerative joint disease) of knee 01/04/2015  . Hemorrhoid 11/14/2014  . Gout of big toe 07/19/2014  . Essential hypertension 08/14/2013  . Gastroesophageal reflux disease without esophagitis 08/14/2013   . COPD (chronic obstructive pulmonary disease) (HCC) 04/17/2011   Past Medical History:  Diagnosis Date  . Allergy    Shellfish, cleaning products  . Anxiety   . Arthritis   . 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 Asthma   . COPD (chronic obstructive pulmonary disease) (HCC)   . Depression   . Diabetes mellitus    Type II  . GERD (gastroesophageal reflux disease)    Pt on Protonix daily  . Glaucoma   . Gout   . Headache(784.0)    otc meds prn  . Hyperlipidemia   . Hypertension   . Irritable bowel syndrome 11/19/2010  . Neuropathy   . Pneumonia YRS AGO  . Restless legs   . Shortness of breath    07/14/2018- uses  4 times a day    Family History  Problem Relation Age of Onset  . Hypertension Father   . Cancer Father   . Heart disease Mother   . Asthma Son        had as a child  . Heart  disease Sister   . Breast cancer Sister   . Hypertension Brother     Past Surgical History:  Procedure Laterality Date  . BACK SURGERY    . CHOLECYSTECTOMY    . COLONOSCOPY    . ENDOMETRIAL ABLATION  10/2010  . EYE SURGERY    . HERNIA REPAIR     umbicial hernia  . JOINT REPLACEMENT Left 03/14/2019   Dr. Magnus Ivan hip  . KNEE ARTHROSCOPY Left    06/07/2017 Dr. August Saucer of [facility]  . KNEE CLOSED REDUCTION Right 12/06/2015   Procedure: CLOSED MANIPULATION RIGHT KNEE;  Surgeon: Kerrin Champagne, MD;  Location: [facility];  Service: Orthopedics;  Laterality: Right;  . KNEE CLOSED REDUCTION Right 01/17/2016   Procedure: CLOSED MANIPULATION RIGHT KNEE;  Surgeon: Kerrin Champagne, MD;  Location: [facility];  Service: Orthopedics;  Laterality: Right;  . KNEE JOINT MANIPULATION Right 12/06/2015  . LACRIMAL TUBE INSERTION Bilateral 03/01/2019   Procedure: LACRIMAL TUBE INSERTION;  Surgeon: Aura Camps, MD;  Location: [facility];  Service: Ophthalmology;  Laterality: Bilateral;  . LACRIMAL TUBE REMOVAL Bilateral 05/03/2019   Procedure: BILATERAL NASOLACRIMAL DUCT  PROBING, IIRIGATION AND TUBE REMOVAL BOTH EYES;  Surgeon: Aura Camps, MD;  Location: [facility];  Service: Ophthalmology;  Laterality: Bilateral;  . LUMBAR DISC SURGERY  06/03/2015   L 2  L3 L4 L5   . LUMBAR LAMINECTOMY/DECOMPRESSION MICRODISCECTOMY N/A 06/03/2015   Procedure: Bilateral lateral recess decompression L2-3, L3-4, L4-5;  Surgeon: Kerrin Champagne, MD;  Location: [facility];  Service: Orthopedics;  Laterality: N/A;  . LUMBAR LAMINECTOMY/DECOMPRESSION MICRODISCECTOMY N/A 10/02/2016   Procedure: Right L5-S1 Lateral Recess Decompression  microdiscectomy;  Surgeon: Kerrin Champagne, MD;  Location: [facility];  Service: Orthopedics;  Laterality: N/A;  . LUMBAR LAMINECTOMY/DECOMPRESSION MICRODISCECTOMY N/A 07/15/2018   Procedure: LEFT L3-4 MICRODISCECTOMY;  Surgeon: Kerrin Champagne, MD;  Location: [facility];  Service: Orthopedics;  Laterality: N/A;  . svd      x 2  . TEAR DUCT PROBING Bilateral 03/01/2019   Procedure: TEAR DUCT PROBING WITH IRRIGATION;  Surgeon: Aura Camps, MD;  Location: [facility];  Service: Ophthalmology;  Laterality: Bilateral;  . TOTAL HIP ARTHROPLASTY Left 03/14/2019   Procedure: LEFT TOTAL HIP ARTHROPLASTY ANTERIOR APPROACH;  Surgeon: [name], MD;  Location: [facility];  Service: Orthopedics;  Laterality: Left;  . TOTAL KNEE ARTHROPLASTY Right 09/06/2015   Procedure: RIGHT TOTAL KNEE ARTHROPLASTY;  Surgeon: Kerrin Champagne, MD;  Location: [facility];  Service: Orthopedics;  Laterality: Right;  . TUBAL LIGATION    . UPPER GASTROINTESTINAL ENDOSCOPY  04/28/11   Social History   Occupational History  . Occupation: unemployed    Associate Professor: UNEMPLOYED  Tobacco Use  . Smoking status: Current Some Day Smoker    Packs/day: 0.25    Years: 32.00    Pack years: 8.00    Types: Cigarettes  . Smokeless tobacco: Never Used  . Tobacco comment: DOWN TO 3 PER DAY  Vaping Use  . Vaping Use: Never used  Substance and Sexual Activity  . Alcohol use: No  .  Drug use: No  . Sexual activity: Yes    Birth control/protection: Surgical, Post-menopausal    Comment: tubal ligation

## 2019-10-16 ENCOUNTER — Emergency Department (HOSPITAL_COMMUNITY)
Admission: EM | Admit: 2019-10-16 | Discharge: 2019-10-16 | Disposition: A | Discharge status: Home / Self Care | Payer: Medicaid Other | Attending: Emergency Medicine | Admitting: Emergency Medicine

## 2019-10-16 ENCOUNTER — Other Ambulatory Visit: Payer: Self-pay

## 2019-10-16 ENCOUNTER — Ambulatory Visit: Payer: Medicaid Other | Admitting: Podiatry

## 2019-10-16 ENCOUNTER — Encounter (HOSPITAL_COMMUNITY): Payer: Self-pay

## 2019-10-16 DIAGNOSIS — Z5321 Procedure and treatment not carried out due to patient leaving prior to being seen by health care provider: Secondary | ICD-10-CM | POA: Diagnosis not present

## 2019-10-16 DIAGNOSIS — L0291 Cutaneous abscess, unspecified: Secondary | ICD-10-CM | POA: Diagnosis not present

## 2019-10-16 DIAGNOSIS — J3089 Other allergic rhinitis: Secondary | ICD-10-CM | POA: Diagnosis not present

## 2019-10-16 DIAGNOSIS — L0211 Cutaneous abscess of neck: Secondary | ICD-10-CM | POA: Insufficient documentation

## 2019-10-16 DIAGNOSIS — J301 Allergic rhinitis due to pollen: Secondary | ICD-10-CM | POA: Diagnosis not present

## 2019-10-16 NOTE — ED Notes (Signed)
I called patient to recheck her vitals and no one responded

## 2019-10-16 NOTE — ED Triage Notes (Signed)
Patient c/o an abscess to the posterior neck x 2 weeks.

## 2019-10-16 NOTE — ED Notes (Signed)
Called 3X. Eloped from waiting area.  

## 2019-10-19 ENCOUNTER — Other Ambulatory Visit: Payer: Self-pay | Admitting: Physical Medicine & Rehabilitation

## 2019-10-19 ENCOUNTER — Other Ambulatory Visit: Payer: Self-pay | Admitting: Internal Medicine

## 2019-10-19 ENCOUNTER — Other Ambulatory Visit (INDEPENDENT_AMBULATORY_CARE_PROVIDER_SITE_OTHER): Payer: Self-pay | Admitting: Specialist

## 2019-10-19 DIAGNOSIS — E119 Type 2 diabetes mellitus without complications: Secondary | ICD-10-CM

## 2019-10-19 DIAGNOSIS — F331 Major depressive disorder, recurrent, moderate: Secondary | ICD-10-CM | POA: Diagnosis not present

## 2019-10-19 DIAGNOSIS — M109 Gout, unspecified: Secondary | ICD-10-CM

## 2019-10-19 DIAGNOSIS — I739 Peripheral vascular disease, unspecified: Secondary | ICD-10-CM

## 2019-10-19 DIAGNOSIS — I10 Essential (primary) hypertension: Secondary | ICD-10-CM

## 2019-10-19 DIAGNOSIS — J3089 Other allergic rhinitis: Secondary | ICD-10-CM

## 2019-10-19 NOTE — Telephone Encounter (Signed)
Requested medication (s) are due for refill today - yes  Requested medication (s) are on the active medication list -yes  Future visit scheduled -no  Last refill: 07/19/19- Valsartan  Notes to clinic: Patient warning coming up for duplicate therapy with diuretic Rx and Plavix fails lab protocol- sent for review of request- may need appointment  Requested Prescriptions  Pending Prescriptions Disp Refills   valsartan-hydrochlorothiazide (DIOVAN-HCT) 160-12.5 MG tablet [Pharmacy Med Name: VALSARTAN - HCTZ 160-12.5MG  TABLET] 30 tablet 2    Sig: TAKE ONE TABLET BY MOUTH ONCE DAILY      Cardiovascular: ARB + Diuretic Combos Failed - 10/19/2019  9:59 AM      Failed - Last BP in normal range    BP Readings from Last 1 Encounters:  10/12/19 103/65          Passed - K in normal range and within 180 days    Potassium  Date Value Ref Range Status  05/03/2019 3.5 3.5 - 5.1 mmol/L Final          Passed - Na in normal range and within 180 days    Sodium  Date Value Ref Range Status  05/03/2019 137 135 - 145 mmol/L Final  03/02/2019 141 134 - 144 mmol/L Final          Passed - Cr in normal range and within 180 days    Creat  Date Value Ref Range Status  03/11/2016 0.90 0.50 - 1.05 mg/dL Final    Comment:      For patients > or = 60 years of age: The upper reference limit for Creatinine is approximately 13% higher for people identified as African-American.      Creatinine, Ser  Date Value Ref Range Status  05/03/2019 0.72 0.44 - 1.00 mg/dL Final   Creatinine, POC  Date Value Ref Range Status  06/10/2016 200 mg/dL Final          Passed - Ca in normal range and within 180 days    Calcium  Date Value Ref Range Status  05/03/2019 9.2 8.9 - 10.3 mg/dL Final   Calcium, Ion  Date Value Ref Range Status  03/01/2019 1.22 1.15 - 1.40 mmol/L Final          Passed - Patient is not pregnant      Passed - Valid encounter within last 6 months    Recent Outpatient Visits            2 months ago Type 2 diabetes mellitus with diabetic polyneuropathy, without long-term current use of insulin (HCC)   [facility] Marcine MatarJohnson, Deborah B, MD   6 months ago Coarse tremors   [facility] Marcine MatarJohnson, Deborah B, MD   7 months ago Lupus [facility](HCC)   [facility] Jonah BlueJohnson, Deborah B, MD   9 months ago Moderate persistent asthma without complication   [facility] Storm FriskWright, Patrick E, MD   10 months ago Pap smear for cervical cancer screening   [facility] Marcine MatarJohnson, Deborah B, MD       Future Appointments             In 2 months Otelia SergeantNitka, Guy SandiferJames E, MD [facility]               clopidogrel (PLAVIX) 75 MG tablet [Pharmacy Med Name: CLOPIDOGREL BISULFAT 75 MG TABLET] 30 tablet 2    Sig: TAKE  1 TABLET (75 MG TOTAL) BY MOUTH DAILY.      Hematology: Antiplatelets - clopidogrel Failed - 10/19/2019  9:59 AM      Failed - Evaluate AST, ALT within 2 months of therapy initiation.      Failed - HCT in normal range and within 180 days    HCT  Date Value Ref Range Status  03/27/2019 33.0 (L) 36 - 46 % Final   Hematocrit  Date Value Ref Range Status  03/02/2019 35.8 34.0 - 46.6 % Final          Failed - HGB in normal range and within 180 days    Hemoglobin  Date Value Ref Range Status  03/27/2019 10.5 (L) 12.0 - 15.0 g/dL Final  00/34/9179 15.0 11.1 - 15.9 g/dL Final          Failed - PLT in normal range and within 180 days    Platelets  Date Value Ref Range Status  03/27/2019 493 (H) 150 - 400 K/uL Final  03/02/2019 269 150 - 450 x10E3/uL Final          Passed - ALT in normal range and within 360 days    ALT  Date Value Ref Range Status  08/02/2019 12 0 - 32 IU/L Final          Passed - AST in normal range and within 360 days    AST  Date Value Ref Range Status  08/02/2019 17 0 - 40 IU/L Final           Passed - Valid encounter within last 6 months    Recent Outpatient Visits           2 months ago Type 2 diabetes mellitus with diabetic polyneuropathy, without long-term current use of insulin (HCC)   [place] [facility] Marcine Matar, MD   6 months ago Coarse tremors   [facility] Marcine Matar, MD   7 months ago Lupus [facility])   [facility] Jonah Blue B, MD   9 months ago Moderate persistent asthma without complication   [facility] Storm Frisk, MD   10 months ago Pap smear for cervical cancer screening   [facility] Marcine Matar, MD       Future Appointments             In 2 months [name], Guy Sandifer, MD [facility]                Requested Prescriptions  Pending Prescriptions Disp Refills   valsartan-hydrochlorothiazide (DIOVAN-HCT) 160-12.5 MG tablet [Pharmacy Med Name: VALSARTAN - HCTZ 160-12.5MG  TABLET] 30 tablet 2    Sig: TAKE ONE TABLET BY MOUTH ONCE DAILY      Cardiovascular: ARB + Diuretic Combos Failed - 10/19/2019  9:59 AM      Failed - Last BP in normal range    BP Readings from Last 1 Encounters:  10/12/19 103/65          Passed - K in normal range and within 180 days    Potassium  Date Value Ref Range Status  05/03/2019 3.5 3.5 - 5.1 mmol/L Final          Passed - Na in normal range and within 180 days    Sodium  Date Value Ref Range Status  05/03/2019 137 135 - 145 mmol/L Final  03/02/2019  141 134 - 144 mmol/L Final          Passed - Cr in normal range and within 180 days    Creat  Date Value Ref Range Status  03/11/2016 0.90 0.50 - 1.05 mg/dL Final    Comment:      For patients > or = 60 years of age: The upper reference limit for Creatinine is approximately 13% higher for people identified as African-American.      Creatinine, Ser  Date Value Ref Range  Status  05/03/2019 0.72 0.44 - 1.00 mg/dL Final   Creatinine, POC  Date Value Ref Range Status  06/10/2016 200 mg/dL Final          Passed - Ca in normal range and within 180 days    Calcium  Date Value Ref Range Status  05/03/2019 9.2 8.9 - 10.3 mg/dL Final   Calcium, Ion  Date Value Ref Range Status  03/01/2019 1.22 1.15 - 1.40 mmol/L Final          Passed - Patient is not pregnant      Passed - Valid encounter within last 6 months    Recent Outpatient Visits           2 months ago Type 2 diabetes mellitus with diabetic polyneuropathy, without long-term current use of insulin (HCC)   [facility] Marcine Matar, MD   6 months ago Coarse tremors   [place] [facility] Marcine Matar, MD   7 months ago Lupus [facility])   [facility] Jonah Blue B, MD   9 months ago Moderate persistent asthma without complication   [facility] Storm Frisk, MD   10 months ago Pap smear for cervical cancer screening   [facility] Marcine Matar, MD       Future Appointments             In 2 months Kerrin Champagne, MD [facility] [place]              clopidogrel (PLAVIX) 75 MG tablet [Pharmacy Med Name: CLOPIDOGREL BISULFAT 75 MG TABLET] 30 tablet 2    Sig: TAKE 1 TABLET (75 MG TOTAL) BY MOUTH DAILY.      Hematology: Antiplatelets - clopidogrel Failed - 10/19/2019  9:59 AM      Failed - Evaluate AST, ALT within 2 months of therapy initiation.      Failed - HCT in normal range and within 180 days    HCT  Date Value Ref Range Status  03/27/2019 33.0 (L) 36 - 46 % Final   Hematocrit  Date Value Ref Range Status  03/02/2019 35.8 34.0 - 46.6 % Final          Failed - HGB in normal range and within 180 days    Hemoglobin  Date Value Ref Range Status  03/27/2019 10.5 (L) 12.0 - 15.0 g/dL Final  16/11/9602 54.0  11.1 - 15.9 g/dL Final          Failed - PLT in normal range and within 180 days    Platelets  Date Value Ref Range Status  03/27/2019 493 (H) 150 - 400 K/uL Final  03/02/2019 269 150 - 450 x10E3/uL Final          Passed - ALT in normal range and within 360 days    ALT  Date Value Ref Range  Status  08/02/2019 12 0 - 32 IU/L Final          Passed - AST in normal range and within 360 days    AST  Date Value Ref Range Status  08/02/2019 17 0 - 40 IU/L Final          Passed - Valid encounter within last 6 months    Recent Outpatient Visits           2 months ago Type 2 diabetes mellitus with diabetic polyneuropathy, without long-term current use of insulin (HCC)   [facility], MD   6 months ago Coarse tremors   [place] [facility] Marcine Matar, MD   7 months ago Lupus [facility])   [facility] Marcine Matar, MD   9 months ago Moderate persistent asthma without complication   [facility] Storm Frisk, MD   10 months ago Pap smear for cervical cancer screening   [facility] Marcine Matar, MD       Future Appointments             In 2 months [name], Guy Sandifer, MD [facility]

## 2019-10-24 ENCOUNTER — Encounter: Payer: Medicaid Other | Admitting: Physical Medicine & Rehabilitation

## 2019-10-25 DIAGNOSIS — F331 Major depressive disorder, recurrent, moderate: Secondary | ICD-10-CM | POA: Diagnosis not present

## 2019-10-26 DIAGNOSIS — J449 Chronic obstructive pulmonary disease, unspecified: Secondary | ICD-10-CM | POA: Diagnosis not present

## 2019-10-26 DIAGNOSIS — G4733 Obstructive sleep apnea (adult) (pediatric): Secondary | ICD-10-CM | POA: Diagnosis not present

## 2019-10-27 DIAGNOSIS — J301 Allergic rhinitis due to pollen: Secondary | ICD-10-CM | POA: Diagnosis not present

## 2019-10-27 DIAGNOSIS — J3089 Other allergic rhinitis: Secondary | ICD-10-CM | POA: Diagnosis not present

## 2019-10-27 DIAGNOSIS — J3081 Allergic rhinitis due to animal (cat) (dog) hair and dander: Secondary | ICD-10-CM | POA: Diagnosis not present

## 2019-10-30 DIAGNOSIS — J301 Allergic rhinitis due to pollen: Secondary | ICD-10-CM | POA: Diagnosis not present

## 2019-10-30 DIAGNOSIS — J3081 Allergic rhinitis due to animal (cat) (dog) hair and dander: Secondary | ICD-10-CM | POA: Diagnosis not present

## 2019-10-30 DIAGNOSIS — J3089 Other allergic rhinitis: Secondary | ICD-10-CM | POA: Diagnosis not present

## 2019-11-01 DIAGNOSIS — F331 Major depressive disorder, recurrent, moderate: Secondary | ICD-10-CM | POA: Diagnosis not present

## 2019-11-03 DIAGNOSIS — E1140 Type 2 diabetes mellitus with diabetic neuropathy, unspecified: Secondary | ICD-10-CM | POA: Diagnosis not present

## 2019-11-03 DIAGNOSIS — G4733 Obstructive sleep apnea (adult) (pediatric): Secondary | ICD-10-CM | POA: Diagnosis not present

## 2019-11-03 DIAGNOSIS — Z96642 Presence of left artificial hip joint: Secondary | ICD-10-CM | POA: Diagnosis not present

## 2019-11-03 DIAGNOSIS — M5116 Intervertebral disc disorders with radiculopathy, lumbar region: Secondary | ICD-10-CM | POA: Diagnosis not present

## 2019-11-03 DIAGNOSIS — R269 Unspecified abnormalities of gait and mobility: Secondary | ICD-10-CM | POA: Diagnosis not present

## 2019-11-03 DIAGNOSIS — J449 Chronic obstructive pulmonary disease, unspecified: Secondary | ICD-10-CM | POA: Diagnosis not present

## 2019-11-07 DIAGNOSIS — J3081 Allergic rhinitis due to animal (cat) (dog) hair and dander: Secondary | ICD-10-CM | POA: Diagnosis not present

## 2019-11-07 DIAGNOSIS — J3089 Other allergic rhinitis: Secondary | ICD-10-CM | POA: Diagnosis not present

## 2019-11-07 DIAGNOSIS — J301 Allergic rhinitis due to pollen: Secondary | ICD-10-CM | POA: Diagnosis not present

## 2019-11-08 DIAGNOSIS — F331 Major depressive disorder, recurrent, moderate: Secondary | ICD-10-CM | POA: Diagnosis not present

## 2019-11-09 DIAGNOSIS — J3081 Allergic rhinitis due to animal (cat) (dog) hair and dander: Secondary | ICD-10-CM | POA: Diagnosis not present

## 2019-11-09 DIAGNOSIS — J301 Allergic rhinitis due to pollen: Secondary | ICD-10-CM | POA: Diagnosis not present

## 2019-11-09 DIAGNOSIS — J3089 Other allergic rhinitis: Secondary | ICD-10-CM | POA: Diagnosis not present

## 2019-11-13 DIAGNOSIS — J3081 Allergic rhinitis due to animal (cat) (dog) hair and dander: Secondary | ICD-10-CM | POA: Diagnosis not present

## 2019-11-13 DIAGNOSIS — J301 Allergic rhinitis due to pollen: Secondary | ICD-10-CM | POA: Diagnosis not present

## 2019-11-13 DIAGNOSIS — J3089 Other allergic rhinitis: Secondary | ICD-10-CM | POA: Diagnosis not present

## 2019-11-14 ENCOUNTER — Other Ambulatory Visit: Payer: Self-pay | Admitting: Internal Medicine

## 2019-11-14 ENCOUNTER — Other Ambulatory Visit: Payer: Self-pay | Admitting: Physical Medicine & Rehabilitation

## 2019-11-14 DIAGNOSIS — I739 Peripheral vascular disease, unspecified: Secondary | ICD-10-CM

## 2019-11-14 DIAGNOSIS — I10 Essential (primary) hypertension: Secondary | ICD-10-CM

## 2019-11-14 NOTE — Telephone Encounter (Signed)
Requested Prescriptions  Pending Prescriptions Disp Refills  . [name] 325 (65 Fe) MG tablet [Pharmacy Med Name: FERROUS SULFATE 325 MG TABLET] 60 tablet     Sig: TAKE 1 TABLET BY MOUTH 2 (TWO) TIMES DAILY WITH A MEAL.     Endocrinology:  Minerals - Iron Supplementation Failed - 11/14/2019  4:19 PM      Failed - HGB in normal range and within 360 days    Hemoglobin  Date Value Ref Range Status  03/27/2019 10.5 (L) 12.0 - 15.0 g/dL Final  32/35/5732 20.2 11.1 - 15.9 g/dL Final         Failed - HCT in normal range and within 360 days    HCT  Date Value Ref Range Status  03/27/2019 33.0 (L) 36 - 46 % Final   Hematocrit  Date Value Ref Range Status  03/02/2019 35.8 34.0 - 46.6 % Final         Failed - RBC in normal range and within 360 days    RBC  Date Value Ref Range Status  03/27/2019 3.79 (L) 3.87 - 5.11 MIL/uL Final         Failed - Fe (serum) in normal range and within 360 days    Iron  Date Value Ref Range Status  10/14/2018 15 (L) 27 - 159 ug/dL Final   Iron Saturation  Date Value Ref Range Status  10/14/2018 3 (LL) 15 - 55 % Final         Failed - Ferritin in normal range and within 360 days    Ferritin  Date Value Ref Range Status  10/14/2018 19 15.0 - 150.0 ng/mL Final         Passed - Valid encounter within last 12 months    Recent Outpatient Visits          3 months ago Type 2 diabetes mellitus with diabetic polyneuropathy, without long-term current use of insulin (HCC)   [facility], [facility]   7 months ago Coarse tremors   [facility]ar, MD   8 months ago Lupus [facility])   [facility] Marcine Matar, MD   10 months ago Moderate persistent asthma without complication   [facility] Storm Frisk, MD   11 months ago Pap smear for cervical cancer screening   [facility]  Marcine Matar, MD      Future Appointments            In 1 month [name], Guy Sandifer, MD [facility]           . simvastatin (ZOCOR) 20 MG tablet [Pharmacy Med Name: SIMVASTATIN 20MG  TABLET] 30 tablet     Sig: TAKE ONE TABLET BY MOUTH DAILY AT BEDTIME.     Cardiovascular:  Antilipid - Statins Failed - 11/14/2019  4:19 PM      Failed - LDL in normal range and within 360 days    LDL Chol Calc (NIH)  Date Value Ref Range Status  08/02/2019 112 (H) 0 - 99 mg/dL Final         Failed - Triglycerides in normal range and within 360 days    Triglycerides  Date Value Ref Range Status  08/02/2019 186 (H) 0 - 149 mg/dL Final         Passed - Total Cholesterol in normal range and within  360 days    Cholesterol, Total  Date Value Ref Range Status  08/02/2019 191 100 - 199 mg/dL Final         Passed - HDL in normal range and within 360 days    HDL  Date Value Ref Range Status  08/02/2019 46 >39 mg/dL Final         Passed - Patient is not pregnant      Passed - Valid encounter within last 12 months    Recent Outpatient Visits          3 months ago Type 2 diabetes mellitus with diabetic polyneuropathy, without long-term current use of insulin (HCC)   [facility], MD   7 months ago Coarse tremors   [place] [facility]ar, MD   8 months ago Lupus [facility])   [facility] Marcine Matar, MD   10 months ago Moderate persistent asthma without complication   [facility] Storm Frisk, MD   11 months ago Pap smear for cervical cancer screening   [facility] Marcine Matar, MD      Future Appointments            In 1 month [name], Guy Sandifer, MD [facility]           . valsartan-hydrochlorothiazide (DIOVAN-HCT) 160-12.5 MG tablet [Pharmacy Med Name: VALSARTAN - HCTZ  160-12.5MG  TABLET] 30 tablet     Sig: TAKE ONE TABLET BY MOUTH ONCE DAILY     Cardiovascular: ARB + Diuretic Combos Failed - 11/14/2019  4:19 PM      Failed - K in normal range and within 180 days    Potassium  Date Value Ref Range Status  05/03/2019 3.5 3.5 - 5.1 mmol/L Final         Failed - Na in normal range and within 180 days    Sodium  Date Value Ref Range Status  05/03/2019 137 135 - 145 mmol/L Final  03/02/2019 141 134 - 144 mmol/L Final         Failed - Cr in normal range and within 180 days    Creat  Date Value Ref Range Status  03/11/2016 0.90 0.50 - 1.05 mg/dL Final    Comment:      For patients > or = 60 years of age: The upper reference limit for Creatinine is approximately 13% higher for people identified as African-American.      Creatinine, Ser  Date Value Ref Range Status  05/03/2019 0.72 0.44 - 1.00 mg/dL Final   Creatinine, POC  Date Value Ref Range Status  06/10/2016 200 mg/dL Final         Failed - Ca in normal range and within 180 days    Calcium  Date Value Ref Range Status  05/03/2019 9.2 8.9 - 10.3 mg/dL Final   Calcium, Ion  Date Value Ref Range Status  03/01/2019 1.22 1.15 - 1.40 mmol/L Final         Failed - Last BP in normal range    BP Readings from Last 1 Encounters:  10/12/19 103/65         Passed - Patient is not pregnant      Passed - Valid encounter within last 6 months    Recent Outpatient Visits          3 months ago  Type 2 diabetes mellitus with diabetic polyneuropathy, without long-term current use of insulin [facility])   [facility], MD   7 months ago Coarse tremors   [facility] Marcine [facility]ar, MD   8 months ago Lupus [facility])   [facility] Marcine Matar, MD   10 months ago Moderate persistent asthma without complication   [facility] Storm Frisk, MD   11 months  ago Pap smear for cervical cancer screening   [facility] Marcine Matar, MD      Future Appointments            In 1 month [name], Guy Sandifer, MD [facility]           . clopidogrel (PLAVIX) 75 MG tablet [Pharmacy Med Name: CLOPIDOGREL BISULFAT 75 MG TABLET] 30 tablet     Sig: TAKE 1 TABLET (75 MG TOTAL) BY MOUTH DAILY.     Hematology: Antiplatelets - clopidogrel Failed - 11/14/2019  4:19 PM      Failed - Evaluate AST, ALT within 2 months of therapy initiation.      Failed - HCT in normal range and within 180 days    HCT  Date Value Ref Range Status  03/27/2019 33.0 (L) 36 - 46 % Final   Hematocrit  Date Value Ref Range Status  03/02/2019 35.8 34.0 - 46.6 % Final         Failed - HGB in normal range and within 180 days    Hemoglobin  Date Value Ref Range Status  03/27/2019 10.5 (L) 12.0 - 15.0 g/dL Final  37/90/2409 73.5 11.1 - 15.9 g/dL Final         Failed - PLT in normal range and within 180 days    Platelets  Date Value Ref Range Status  03/27/2019 493 (H) 150 - 400 K/uL Final  03/02/2019 269 150 - 450 x10E3/uL Final         Passed - ALT in normal range and within 360 days    ALT  Date Value Ref Range Status  08/02/2019 12 0 - 32 IU/L Final         Passed - AST in normal range and within 360 days    AST  Date Value Ref Range Status  08/02/2019 17 0 - 40 IU/L Final         Passed - Valid encounter within last 6 months    Recent Outpatient Visits          3 months ago Type 2 diabetes mellitus with diabetic polyneuropathy, without long-term current use of insulin (HCC)   [facility], MD   7 months ago Coarse tremors   [facility] [facility]ar, MD   8 months ago Lupus [facility])   [facility] Marcine Matar, MD   10 months ago Moderate persistent asthma without complication   [facility] Storm Frisk, MD   11 months ago Pap smear for cervical cancer screening   [facility] Marcine Matar, MD      Future Appointments            In 1 month [name], Guy Sandifer, MD [facility]

## 2019-11-14 NOTE — Telephone Encounter (Signed)
Requested medication (s) are due for refill today: yes  Requested medication (s) are on the active medication list: yes  Last refill:  07/19/19  Future visit scheduled: no  Notes to clinic:  overdue lab work   Requested Prescriptions  Pending Prescriptions Disp Refills   [name] 325 (65 Fe) MG tablet [Pharmacy Med Name: FERROUS SULFATE 325 MG TABLET] 60 tablet     Sig: TAKE 1 TABLET BY MOUTH 2 (TWO) TIMES DAILY WITH A MEAL.      Endocrinology:  Minerals - Iron Supplementation Failed - 11/14/2019  4:19 PM      Failed - HGB in normal range and within 360 days    Hemoglobin  Date Value Ref Range Status  03/27/2019 10.5 (L) 12.0 - 15.0 g/dL Final  10/93/2355 73.2 11.1 - 15.9 g/dL Final          Failed - HCT in normal range and within 360 days    HCT  Date Value Ref Range Status  03/27/2019 33.0 (L) 36 - 46 % Final   Hematocrit  Date Value Ref Range Status  03/02/2019 35.8 34.0 - 46.6 % Final          Failed - RBC in normal range and within 360 days    RBC  Date Value Ref Range Status  03/27/2019 3.79 (L) 3.87 - 5.11 MIL/uL Final          Failed - Fe (serum) in normal range and within 360 days    Iron  Date Value Ref Range Status  10/14/2018 15 (L) 27 - 159 ug/dL Final   Iron Saturation  Date Value Ref Range Status  10/14/2018 3 (LL) 15 - 55 % Final          Failed - Ferritin in normal range and within 360 days    Ferritin  Date Value Ref Range Status  10/14/2018 19 15.0 - 150.0 ng/mL Final          Passed - Valid encounter within last 12 months    Recent Outpatient Visits           3 months ago Type 2 diabetes mellitus with diabetic polyneuropathy, without long-term current use of insulin (HCC)   [place] [facility], MD   7 months ago Coarse tremors   [facility] [place] [facility]ine Matar, MD   8 months ago Lupus [facility])   [facility] Marcine Matar, MD    10 months ago Moderate persistent asthma without complication   [facility] Storm Frisk, MD   11 months ago Pap smear for cervical cancer screening    [facility] Marcine Matar, MD       Future Appointments             In 1 month [name], Guy Sandifer, MD [facility] [place]             Refused Prescriptions Disp Refills   simvastatin (ZOCOR) 20 MG tablet [Pharmacy Med Name: SIMVASTATIN 20MG  TABLET] 30 tablet     Sig: TAKE ONE TABLET BY MOUTH DAILY AT BEDTIME.      Cardiovascular:  Antilipid - Statins Failed - 11/14/2019  4:19 PM      Failed - LDL in normal range and within 360 days    LDL Chol Calc (NIH)  Date Value Ref Range Status  08/02/2019 112 (H) 0 -  99 mg/dL Final          Failed - Triglycerides in normal range and within 360 days    Triglycerides  Date Value Ref Range Status  08/02/2019 186 (H) 0 - 149 mg/dL Final          Passed - Total Cholesterol in normal range and within 360 days    Cholesterol, Total  Date Value Ref Range Status  08/02/2019 191 100 - 199 mg/dL Final          Passed - HDL in normal range and within 360 days    HDL  Date Value Ref Range Status  08/02/2019 46 >39 mg/dL Final          Passed - Patient is not pregnant      Passed - Valid encounter within last 12 months    Recent Outpatient Visits           3 months ago Type 2 diabetes mellitus with diabetic polyneuropathy, without long-term current use of insulin (HCC)   [place] [facility] [place] B, MD   7 months ago Coarse tremors   [facility] [facility] Marcine MatarJohnson, Deborah B, MD   8 months ago Lupus [facility](HCC)   [place] [facility] Marcine MatarJohnson, Deborah B, MD   10 months ago Moderate persistent asthma without complication   [facility] Storm FriskWright, Patrick E, MD   11 months ago Pap smear for cervical cancer screening    [place] [facility] Marcine MatarJohnson, Deborah B, MD       Future Appointments             In 1 month Otelia SergeantNitka, Guy SandiferJames E, MD [facility] [place]              valsartan-hydrochlorothiazide (DIOVAN-HCT) 160-12.5 MG tablet [Pharmacy Med Name: VALSARTAN - HCTZ 160-12.5MG  TABLET] 30 tablet     Sig: TAKE ONE TABLET BY MOUTH ONCE DAILY      Cardiovascular: ARB + Diuretic Combos Failed - 11/14/2019  4:19 PM      Failed - K in normal range and within 180 days    Potassium  Date Value Ref Range Status  05/03/2019 3.5 3.5 - 5.1 mmol/L Final          Failed - Na in normal range and within 180 days    Sodium  Date Value Ref Range Status  05/03/2019 137 135 - 145 mmol/L Final  03/02/2019 141 134 - 144 mmol/L Final          Failed - Cr in normal range and within 180 days    Creat  Date Value Ref Range Status  03/11/2016 0.90 0.50 - 1.05 mg/dL Final    Comment:      For patients > or = 60 years of age: The upper reference limit for Creatinine is approximately 13% higher for people identified as African-American.      Creatinine, Ser  Date Value Ref Range Status  05/03/2019 0.72 0.44 - 1.00 mg/dL Final   Creatinine, POC  Date Value Ref Range Status  06/10/2016 200 mg/dL Final          Failed - Ca in normal range and within 180 days    Calcium  Date Value Ref Range Status  05/03/2019 9.2 8.9 - 10.3 mg/dL Final   Calcium, Ion  Date Value Ref Range Status  03/01/2019 1.22 1.15 - 1.40 mmol/L Final  Failed - Last BP in normal range    BP Readings from Last 1 Encounters:  10/12/19 103/65          Passed - Patient is not pregnant      Passed - Valid encounter within last 6 months    Recent Outpatient Visits           3 months ago Type 2 diabetes mellitus with diabetic polyneuropathy, without long-term current use of insulin (HCC)   [place] [facility], MD   7 months ago Coarse tremors   [facility], MD   8 months ago Lupus [facility])   [facility] Marcine Matar, MD   10 months ago Moderate persistent asthma without complication   [facility] Storm Frisk, MD   11 months ago Pap smear for cervical cancer screening   [facility] Marcine Matar, MD       Future Appointments             In 1 month [name], Guy Sandifer, MD [facility]               clopidogrel (PLAVIX) 75 MG tablet [Pharmacy Med Name: CLOPIDOGREL BISULFAT 75 MG TABLET] 30 tablet     Sig: TAKE 1 TABLET (75 MG TOTAL) BY MOUTH DAILY.      Hematology: Antiplatelets - clopidogrel Failed - 11/14/2019  4:19 PM      Failed - Evaluate AST, ALT within 2 months of therapy initiation.      Failed - HCT in normal range and within 180 days    HCT  Date Value Ref Range Status  03/27/2019 33.0 (L) 36 - 46 % Final   Hematocrit  Date Value Ref Range Status  03/02/2019 35.8 34.0 - 46.6 % Final          Failed - HGB in normal range and within 180 days    Hemoglobin  Date Value Ref Range Status  03/27/2019 10.5 (L) 12.0 - 15.0 g/dL Final  35/46/5681 27.5 11.1 - 15.9 g/dL Final          Failed - PLT in normal range and within 180 days    Platelets  Date Value Ref Range Status  03/27/2019 493 (H) 150 - 400 K/uL Final  03/02/2019 269 150 - 450 x10E3/uL Final          Passed - ALT in normal range and within 360 days    ALT  Date Value Ref Range Status  08/02/2019 12 0 - 32 IU/L Final          Passed - AST in normal range and within 360 days    AST  Date Value Ref Range Status  08/02/2019 17 0 - 40 IU/L Final          Passed - Valid encounter within last 6 months    Recent Outpatient Visits           3 months ago Type 2 diabetes mellitus with diabetic polyneuropathy, without long-term current use of insulin (HCC)   [facility] Marcine Matar, MD   7 months ago Coarse tremors   [facility] Marcine Matar, MD   8 months ago Lupus [facility])   [facility] Marcine Matar, MD  10 months ago Moderate persistent asthma without complication   [facility] Storm Frisk, MD   11 months ago Pap smear for cervical cancer screening   [facility] Marcine Matar, MD       Future Appointments             In 1 month [name], Guy Sandifer, MD [facility]

## 2019-11-15 ENCOUNTER — Other Ambulatory Visit: Payer: Self-pay | Admitting: Internal Medicine

## 2019-11-15 ENCOUNTER — Other Ambulatory Visit: Payer: Self-pay | Admitting: Physical Medicine & Rehabilitation

## 2019-11-15 ENCOUNTER — Other Ambulatory Visit: Payer: Self-pay | Admitting: Specialist

## 2019-11-15 DIAGNOSIS — F17200 Nicotine dependence, unspecified, uncomplicated: Secondary | ICD-10-CM

## 2019-11-15 DIAGNOSIS — J3089 Other allergic rhinitis: Secondary | ICD-10-CM

## 2019-11-15 NOTE — Telephone Encounter (Signed)
Requested medication (s) are due for refill today - expired  Requested medication (s) are on the active medication list -no  Future visit scheduled -no  Last refill: 10/19/19  Notes to clinic: Request for expired Rx- no longer on current medication list  Requested Prescriptions  Pending Prescriptions Disp Refills   ALLERGY RELIEF 10 MG tablet [Pharmacy Med Name: LORATADINE 10MG  TABLET] 30 tablet 11    Sig: TAKE 1 TABLET (10 MG TOTAL) BY MOUTH DAILY.      Ear, Nose, and Throat:  Antihistamines Passed - 11/15/2019 11:33 AM      Passed - Valid encounter within last 12 months    Recent Outpatient Visits           3 months ago Type 2 diabetes mellitus with diabetic polyneuropathy, without long-term current use of insulin (HCC)   [place] [facility], MD   7 months ago Coarse tremors   [place] [facility]ine [facility]ar, MD   8 months ago Lupus [facility])   [facility], MD   10 months ago Moderate persistent asthma without complication   [facility], MD   11 months ago Pap smear for cervical cancer screening   [facility], MD       Future Appointments             In 1 month Marcine Matar, [name], MD [facility]             Signed Prescriptions Disp Refills   nicotine (NICODERM CQ - DOSED IN MG/24 HOURS) 14 mg/24hr patch 28 patch 0    Sig: PLACE 1 PATCH ONTO THE SKIN DAILY      Psychiatry:  Drug Dependence Therapy Passed - 11/15/2019 11:33 AM      Passed - Valid encounter within last 12 months    Recent Outpatient Visits           3 months ago Type 2 diabetes mellitus with diabetic polyneuropathy, without long-term current use of insulin (HCC)   [place] [facility], MD   7 months ago Coarse tremors   [facility] And Wellness [facility], MD   8 months ago Lupus [facility])   [facility] B, MD   10 months ago Moderate persistent asthma without complication   [facility], MD   11 months ago Pap smear for cervical cancer screening   [facility], MD       Future Appointments             In 1 month Marcine Matar, [name], MD [facility]                Requested Prescriptions  Pending Prescriptions Disp Refills   ALLERGY RELIEF 10 MG tablet [Pharmacy Med Name: LORATADINE 10MG  TABLET] 30 tablet 11    Sig: TAKE 1 TABLET (10 MG TOTAL) BY MOUTH DAILY.      Ear, Nose, and Throat:  Antihistamines Passed - 11/15/2019 11:33 AM      Passed - Valid encounter within last 12 months    Recent Outpatient Visits           3 months ago Type  2 diabetes mellitus with diabetic polyneuropathy, without long-term current use of insulin (HCC)   [place] [facility] Marcine Matar, MD   7 months ago Coarse tremors   [facility] Marcine Matar, MD   8 months ago Lupus [facility])   [facility] Marcine Matar, MD   10 months ago Moderate persistent asthma without complication   [facility] And Wellness Storm Frisk, MD   11 months ago Pap smear for cervical cancer screening   [facility] Marcine Matar, MD       Future Appointments             In 1 month [name], Guy Sandifer, MD [facility]             Signed Prescriptions Disp Refills   nicotine (NICODERM CQ - DOSED IN MG/24 HOURS) 14 mg/24hr patch 28 patch 0    Sig: PLACE 1 PATCH ONTO THE SKIN DAILY      Psychiatry:  Drug Dependence Therapy Passed - 11/15/2019 11:33 AM      Passed - Valid encounter within last 12 months     Recent Outpatient Visits           3 months ago Type 2 diabetes mellitus with diabetic polyneuropathy, without long-term current use of insulin (HCC)   [facility] Marcine Matar, MD   7 months ago Coarse tremors   [facility] Marcine Matar, MD   8 months ago Lupus [facility])   [facility] Marcine Matar, MD   10 months ago Moderate persistent asthma without complication   [facility] And Wellness Storm Frisk, MD   11 months ago Pap smear for cervical cancer screening   [facility] Marcine Matar, MD       Future Appointments             In 1 month [name], Guy Sandifer, MD [facility]

## 2019-11-16 ENCOUNTER — Encounter: Payer: Medicaid Other | Attending: Physical Medicine & Rehabilitation | Admitting: Physical Medicine & Rehabilitation

## 2019-11-16 ENCOUNTER — Other Ambulatory Visit: Payer: Self-pay

## 2019-11-16 ENCOUNTER — Encounter: Payer: Self-pay | Admitting: Physical Medicine & Rehabilitation

## 2019-11-16 VITALS — BP 112/74 | HR 69 | Temp 98.8°F | Ht 66.0 in | Wt 175.6 lb

## 2019-11-16 DIAGNOSIS — R202 Paresthesia of skin: Secondary | ICD-10-CM | POA: Insufficient documentation

## 2019-11-16 DIAGNOSIS — R269 Unspecified abnormalities of gait and mobility: Secondary | ICD-10-CM | POA: Insufficient documentation

## 2019-11-16 DIAGNOSIS — G894 Chronic pain syndrome: Secondary | ICD-10-CM | POA: Diagnosis not present

## 2019-11-16 DIAGNOSIS — M961 Postlaminectomy syndrome, not elsewhere classified: Secondary | ICD-10-CM | POA: Diagnosis not present

## 2019-11-16 DIAGNOSIS — Z96651 Presence of right artificial knee joint: Secondary | ICD-10-CM | POA: Insufficient documentation

## 2019-11-16 DIAGNOSIS — Z720 Tobacco use: Secondary | ICD-10-CM | POA: Insufficient documentation

## 2019-11-16 MED ORDER — DULOXETINE HCL 60 MG PO CPEP: 60.0000 mg | ORAL_CAPSULE | Freq: Every day | ORAL | 3 refills | Status: DC

## 2019-11-16 MED ORDER — MELOXICAM 15 MG PO TABS: 15.0000 mg | ORAL_TABLET | Freq: Every day | ORAL | 3 refills | Status: DC

## 2019-11-16 NOTE — Progress Notes (Signed)
Subjective:    Patient ID: Stacy [name], female    DOB: 03-16-1960, 59 y.o.   MRN: [id number]  HPI Female with pmh/psh RLS, neuropathy, HTN, headache, GERD, DM, COPD, left THA, lumbar spinal stenosis with claudication s/p lumbar surgery x3 (most recent 07/15/2018), DJD, PAD right TKA in 2017 followed up by manipulation x2, left THA in 03/2019 presents LE pain. Initially stated: History taken from chart review and patient. Started ~2018.  States she had a knee pain with buckling.  Denies inciting event.  Getting improve.  Ice/heat improves the pain.  Prolonged standing exacerbates the pain.  Non-radiating. Sharp.  Intermittent.  Denies associated numbness.  Denies falls. Pain limits prolonged standing and ambulation.   Last clinic visit 07/24/2019.  Since that time, patient went to the ED due to concerns of right neck abscess, however she left the ED prior to evaluation-notes reviewed. She saw Ortho and had MRI, but forgot what it said.  She was sent to therapy.  She continues to take medications as prescribed. Her activity level is stable. Her left leg and hip have improved. She is smoking 5 cig/day. Tremors have resolved. Denies falls.  Pain Inventory Average Pain 7 Pain Right Now 7 My pain is constant, sharp and aching  In the last 24 hours, has pain interfered with the following? General activity 2 Relation with others 2 Enjoyment of life 2 What TIME of day is your pain at its worst? morning, evening, night Sleep (in general) Fair  Pain is worse with: bending and standing Pain improves with: heat/ice, medication and TENS Relief from Meds: 9    Family History  Problem Relation Age of Onset  . Hypertension Father   . Cancer Father   . Heart disease Mother   . Asthma Son        had as a child  . Heart disease Sister   . Breast cancer Sister   . Hypertension Brother    Social History   Socioeconomic History  . Marital status: Married    Spouse name: Not on file  .  Number of children: 2  . Years of education: Not on file  . Highest education level: Not on file  Occupational History  . Occupation: unemployed    Associate Professormployer: UNEMPLOYED  Tobacco Use  . Smoking status: Current Some Day Smoker    Packs/day: 0.25    Years: 32.00    Pack years: 8.00    Types: Cigarettes  . Smokeless tobacco: Never Used  . Tobacco comment: DOWN TO 3 PER DAY  Vaping Use  . Vaping Use: Never used  Substance and Sexual Activity  . Alcohol use: No  . Drug use: No  . Sexual activity: Yes    Birth control/protection: Surgical, Post-menopausal    Comment: tubal ligation  Other Topics Concern  . Not on file  Social History Narrative  . Not on file   Social Determinants of Health   Financial Resource Strain:   . Difficulty of Paying Living Expenses: Not on file  Food Insecurity:   . Worried About Programme researcher, broadcasting/film/videounning Out of Food in the Last Year: Not on file  . Ran Out of Food in the Last Year: Not on file  Transportation Needs:   . Lack of Transportation (Medical): Not on file  . Lack of Transportation (Non-Medical): Not on file  Physical Activity:   . Days of Exercise per Week: Not on file  . Minutes of Exercise per Session: Not on file  Stress:   .  Feeling of Stress : Not on file  Social Connections:   . Frequency of Communication with Friends and Family: Not on file  . Frequency of Social Gatherings with Friends and Family: Not on file  . Attends Religious Services: Not on file  . Active Member of Clubs or Organizations: Not on file  . Attends Banker Meetings: Not on file  . Marital Status: Not on file   Past Surgical History:  Procedure Laterality Date  . BACK SURGERY    . CHOLECYSTECTOMY    . COLONOSCOPY    . ENDOMETRIAL ABLATION  10/2010  . EYE SURGERY    . HERNIA REPAIR     umbicial hernia  . JOINT REPLACEMENT Left 03/14/2019   Dr. Magnus Ivan hip  . KNEE ARTHROSCOPY Left    06/07/2017 Dr. August Saucer of [facility]  . KNEE CLOSED REDUCTION Right  12/06/2015   Procedure: CLOSED MANIPULATION RIGHT KNEE;  Surgeon: Kerrin Champagne, MD;  Location: [facility] OR;  Service: Orthopedics;  Laterality: Right;  . KNEE CLOSED REDUCTION Right 01/17/2016   Procedure: CLOSED MANIPULATION RIGHT KNEE;  Surgeon: Kerrin Champagne, MD;  Location: [facility] OR;  Service: Orthopedics;  Laterality: Right;  . KNEE JOINT MANIPULATION Right 12/06/2015  . LACRIMAL TUBE INSERTION Bilateral 03/01/2019   Procedure: LACRIMAL TUBE INSERTION;  Surgeon: Aura Camps, MD;  Location: [facility];  Service: Ophthalmology;  Laterality: Bilateral;  . LACRIMAL TUBE REMOVAL Bilateral 05/03/2019   Procedure: BILATERAL NASOLACRIMAL DUCT PROBING, IIRIGATION AND TUBE REMOVAL BOTH EYES;  Surgeon: Aura Camps, MD;  Location: [facility];  Service: Ophthalmology;  Laterality: Bilateral;  . LUMBAR DISC SURGERY  06/03/2015   L 2  L3 L4 L5   . LUMBAR LAMINECTOMY/DECOMPRESSION MICRODISCECTOMY N/A 06/03/2015   Procedure: Bilateral lateral recess decompression L2-3, L3-4, L4-5;  Surgeon: Kerrin Champagne, MD;  Location: [facility];  Service: Orthopedics;  Laterality: N/A;  . LUMBAR LAMINECTOMY/DECOMPRESSION MICRODISCECTOMY N/A 10/02/2016   Procedure: Right L5-S1 Lateral Recess Decompression  microdiscectomy;  Surgeon: Kerrin Champagne, MD;  Location: [facility] OR;  Service: Orthopedics;  Laterality: N/A;  . LUMBAR LAMINECTOMY/DECOMPRESSION MICRODISCECTOMY N/A 07/15/2018   Procedure: LEFT L3-4 MICRODISCECTOMY;  Surgeon: Kerrin Champagne, MD;  Location: [facility];  Service: Orthopedics;  Laterality: N/A;  . svd      x 2  . TEAR DUCT PROBING Bilateral 03/01/2019   Procedure: TEAR DUCT PROBING WITH IRRIGATION;  Surgeon: Aura Camps, MD;  Location: [facility];  Service: Ophthalmology;  Laterality: Bilateral;  . TOTAL HIP ARTHROPLASTY Left 03/14/2019   Procedure: LEFT TOTAL HIP ARTHROPLASTY ANTERIOR APPROACH;  Surgeon: [name], MD;  Location: [facility];  Service: Orthopedics;   Laterality: Left;  . TOTAL KNEE ARTHROPLASTY Right 09/06/2015   Procedure: RIGHT TOTAL KNEE ARTHROPLASTY;  Surgeon: Kerrin Champagne, MD;  Location: [facility];  Service: Orthopedics;  Laterality: Right;  . TUBAL LIGATION    . UPPER GASTROINTESTINAL ENDOSCOPY  04/28/11   Past Medical History:  Diagnosis Date  . Allergy    Shellfish, cleaning products  . Anxiety   . Arthritis   . 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 Asthma   . COPD (chronic obstructive pulmonary disease) (HCC)   . Depression   . Diabetes mellitus    Type II  . GERD (gastroesophageal reflux disease)    Pt on Protonix daily  . Glaucoma   . Gout   . Headache(784.0)    otc meds  prn  . Hyperlipidemia   . Hypertension   . Irritable bowel syndrome 11/19/2010  . Neuropathy   . Pneumonia YRS AGO  . Restless legs   . Shortness of breath    07/14/2018- uses  4 times a day   BP 112/74   Pulse 69   Temp 98.8 F (37.1 C)   Ht 5\' 6"  (1.676 m)   Wt 175 lb 9.6 oz (79.7 kg)   LMP 09/01/2010   SpO2 95%   BMI 28.34 kg/m   Opioid Risk Score:   Fall Risk Score:  `1  Depression screen PHQ 2/9  Depression screen [facility] 2/9 07/24/2019 10/14/2018 08/11/2018 08/09/2018 06/06/2018 03/30/2018 02/14/2018  Decreased Interest 0 0 0 0 0 0 0  Down, Depressed, Hopeless 0 0 0 0 0 0 0  PHQ - 2 Score 0 0 0 0 0 0 0  Altered sleeping - - - 0 0 - -  Tired, decreased energy - - - 0 0 - -  Change in appetite - - - 0 0 - -  Feeling bad or failure about yourself  - - - 0 0 - -  Trouble concentrating - - - 0 0 - -  Moving slowly or fidgety/restless - - - 0 0 - -  Suicidal thoughts - - - 0 - - -  PHQ-9 Score - - - 0 0 - -  Difficult doing work/chores - - - Not difficult at all - - -  Some recent data might be hidden    Review of Systems  Constitutional: Positive for diaphoresis.  HENT: Negative.   Eyes: Negative.   Respiratory: Positive for wheezing.        Has cpap now  Cardiovascular: Negative.   Gastrointestinal: Negative.    Endocrine:       High/low blood sugar  Genitourinary: Negative.   Musculoskeletal: Positive for arthralgias, back pain, gait problem, joint swelling and myalgias.       Spasms   Skin: Negative.   Allergic/Immunologic: Negative.   Neurological:       Tremors are better now/ tingling sometimes  Hematological: Bruises/bleeds easily.       Plavix  Psychiatric/Behavioral: The patient is nervous/anxious.   All other systems reviewed and are negative.     Objective:   Physical Exam  Constitutional: No distress . Vital signs reviewed. HENT: Normocephalic.  Atraumatic. Eyes: EOMI. No discharge. Cardiovascular: No JVD.   Respiratory: Normal effort.  No stridor.   GI: Non-distended.   Skin: Warm and dry.  Posterior neck wound healing.02/16/2018 Psych: Normal mood.  Normal behavior. Musc: Gait antalgic             Mild right knee edema Neuro:             Strength         5/5 in all LE myotomes     Assessment & Plan:  Female with pmh/psh RLS, neuropathy, HTN, headache, GERD, DM, COPD, left THA, lumbar spinal stenosis with claudication s/p lumbar surgery x3 (most recent 07/15/2018), DJD, PAD right TKA in 2017 followed up by manipulation x2, left THA in 03/2019 presents with LE pain.  1. Chronic right knee pain s/p TKA with manipulation x2             MRI from 09/2017 showing postop changes             Cont Heat/Cold             Continue outpatient PT after  THA             Continue  E-stim             Will consider bracing if necessary             Continue OTC Lidoderm patch              Continue Cymbalta             Cont Robaxin per Ortho spine             Continue Mobic 15mg  with food, labs on 08/02/19 within acceptable range, Celebrex, Volaren d/ced - educated patient to avoid duplicate therapy with NSAIDs             Patient states main goal is to be more active, improving  2. Gait abnormality             Left THA on 03/2019             See #1  Using rollator again  Cont therapies  3.  LLE pain             Continue Gabapentin to 600 TID - has been on years             See #1  4. Left hip pain now s/p left THA             See #2  5. Low back pain                      Continue follow up with Ortho spine, follow up after therapies in Dec to determine plan  6. Tobacco abuse             Now smoking 5 cig/day             Educated/Encouraged cessation again  7. Tremors              See #1  Improved

## 2019-11-17 DIAGNOSIS — L905 Scar conditions and fibrosis of skin: Secondary | ICD-10-CM | POA: Diagnosis not present

## 2019-11-17 DIAGNOSIS — R2231 Localized swelling, mass and lump, right upper limb: Secondary | ICD-10-CM | POA: Diagnosis not present

## 2019-11-20 ENCOUNTER — Ambulatory Visit: Payer: Medicaid Other

## 2019-11-20 DIAGNOSIS — J3089 Other allergic rhinitis: Secondary | ICD-10-CM | POA: Diagnosis not present

## 2019-11-20 DIAGNOSIS — J3081 Allergic rhinitis due to animal (cat) (dog) hair and dander: Secondary | ICD-10-CM | POA: Diagnosis not present

## 2019-11-20 DIAGNOSIS — J301 Allergic rhinitis due to pollen: Secondary | ICD-10-CM | POA: Diagnosis not present

## 2019-11-21 ENCOUNTER — Ambulatory Visit: Payer: Medicaid Other

## 2019-11-22 ENCOUNTER — Ambulatory Visit: Payer: Medicaid Other | Attending: Internal Medicine | Admitting: Pharmacist

## 2019-11-22 ENCOUNTER — Other Ambulatory Visit: Payer: Self-pay

## 2019-11-22 DIAGNOSIS — F331 Major depressive disorder, recurrent, moderate: Secondary | ICD-10-CM | POA: Diagnosis not present

## 2019-11-22 DIAGNOSIS — Z23 Encounter for immunization: Secondary | ICD-10-CM

## 2019-11-22 NOTE — Progress Notes (Signed)
Patient presents for vaccination against Influenza per orders of Dr. Johnson. Consent given. Counseling provided. No contraindications exists. Vaccine administered without incident.  ° °Antonisha Waskey, PharmD °PGY2 Ambulatory Care Resident °[facility]  ° °

## 2019-11-24 DIAGNOSIS — J301 Allergic rhinitis due to pollen: Secondary | ICD-10-CM | POA: Diagnosis not present

## 2019-11-24 DIAGNOSIS — J3081 Allergic rhinitis due to animal (cat) (dog) hair and dander: Secondary | ICD-10-CM | POA: Diagnosis not present

## 2019-11-24 DIAGNOSIS — J3089 Other allergic rhinitis: Secondary | ICD-10-CM | POA: Diagnosis not present

## 2019-11-27 DIAGNOSIS — J3089 Other allergic rhinitis: Secondary | ICD-10-CM | POA: Diagnosis not present

## 2019-11-27 DIAGNOSIS — J3081 Allergic rhinitis due to animal (cat) (dog) hair and dander: Secondary | ICD-10-CM | POA: Diagnosis not present

## 2019-11-27 DIAGNOSIS — J301 Allergic rhinitis due to pollen: Secondary | ICD-10-CM | POA: Diagnosis not present

## 2019-11-29 DIAGNOSIS — J301 Allergic rhinitis due to pollen: Secondary | ICD-10-CM | POA: Diagnosis not present

## 2019-11-29 DIAGNOSIS — F331 Major depressive disorder, recurrent, moderate: Secondary | ICD-10-CM | POA: Diagnosis not present

## 2019-11-29 DIAGNOSIS — J3081 Allergic rhinitis due to animal (cat) (dog) hair and dander: Secondary | ICD-10-CM | POA: Diagnosis not present

## 2019-11-29 DIAGNOSIS — J3089 Other allergic rhinitis: Secondary | ICD-10-CM | POA: Diagnosis not present

## 2019-12-02 ENCOUNTER — Ambulatory Visit: Payer: Medicaid Other | Attending: Internal Medicine

## 2019-12-02 DIAGNOSIS — Z23 Encounter for immunization: Secondary | ICD-10-CM

## 2019-12-02 NOTE — Progress Notes (Signed)
   Covid-19 Vaccination Clinic  Name:  Stacy [name] [facility]    MRN: [id number] DOB: April 17, 1960  12/02/2019  Stacy [name] was observed post Covid-19 immunization for 15 minutes without incident. She was provided with Vaccine Information Sheet and instruction to access the V-Safe system.   Stacy [name] was instructed to call 911 with any severe reactions post vaccine: Marland Kitchen Difficulty breathing  . Swelling of face and throat  . A fast heartbeat  . A bad rash all over body  . Dizziness and weakness

## 2019-12-04 DIAGNOSIS — J3089 Other allergic rhinitis: Secondary | ICD-10-CM | POA: Diagnosis not present

## 2019-12-04 DIAGNOSIS — J3081 Allergic rhinitis due to animal (cat) (dog) hair and dander: Secondary | ICD-10-CM | POA: Diagnosis not present

## 2019-12-04 DIAGNOSIS — E1140 Type 2 diabetes mellitus with diabetic neuropathy, unspecified: Secondary | ICD-10-CM | POA: Diagnosis not present

## 2019-12-04 DIAGNOSIS — G4733 Obstructive sleep apnea (adult) (pediatric): Secondary | ICD-10-CM | POA: Diagnosis not present

## 2019-12-04 DIAGNOSIS — Z96642 Presence of left artificial hip joint: Secondary | ICD-10-CM | POA: Diagnosis not present

## 2019-12-04 DIAGNOSIS — J301 Allergic rhinitis due to pollen: Secondary | ICD-10-CM | POA: Diagnosis not present

## 2019-12-04 DIAGNOSIS — J449 Chronic obstructive pulmonary disease, unspecified: Secondary | ICD-10-CM | POA: Diagnosis not present

## 2019-12-04 DIAGNOSIS — M5116 Intervertebral disc disorders with radiculopathy, lumbar region: Secondary | ICD-10-CM | POA: Diagnosis not present

## 2019-12-04 DIAGNOSIS — R269 Unspecified abnormalities of gait and mobility: Secondary | ICD-10-CM | POA: Diagnosis not present

## 2019-12-06 DIAGNOSIS — F331 Major depressive disorder, recurrent, moderate: Secondary | ICD-10-CM | POA: Diagnosis not present

## 2019-12-11 DIAGNOSIS — J3089 Other allergic rhinitis: Secondary | ICD-10-CM | POA: Diagnosis not present

## 2019-12-11 DIAGNOSIS — J3081 Allergic rhinitis due to animal (cat) (dog) hair and dander: Secondary | ICD-10-CM | POA: Diagnosis not present

## 2019-12-11 DIAGNOSIS — J301 Allergic rhinitis due to pollen: Secondary | ICD-10-CM | POA: Diagnosis not present

## 2019-12-13 ENCOUNTER — Other Ambulatory Visit: Payer: Self-pay | Admitting: Internal Medicine

## 2019-12-13 DIAGNOSIS — F331 Major depressive disorder, recurrent, moderate: Secondary | ICD-10-CM | POA: Diagnosis not present

## 2019-12-18 DIAGNOSIS — J301 Allergic rhinitis due to pollen: Secondary | ICD-10-CM | POA: Diagnosis not present

## 2019-12-18 DIAGNOSIS — J3081 Allergic rhinitis due to animal (cat) (dog) hair and dander: Secondary | ICD-10-CM | POA: Diagnosis not present

## 2019-12-18 DIAGNOSIS — J3089 Other allergic rhinitis: Secondary | ICD-10-CM | POA: Diagnosis not present

## 2019-12-20 DIAGNOSIS — F331 Major depressive disorder, recurrent, moderate: Secondary | ICD-10-CM | POA: Diagnosis not present

## 2019-12-27 DIAGNOSIS — J3081 Allergic rhinitis due to animal (cat) (dog) hair and dander: Secondary | ICD-10-CM | POA: Diagnosis not present

## 2019-12-27 DIAGNOSIS — F331 Major depressive disorder, recurrent, moderate: Secondary | ICD-10-CM | POA: Diagnosis not present

## 2019-12-27 DIAGNOSIS — J3089 Other allergic rhinitis: Secondary | ICD-10-CM | POA: Diagnosis not present

## 2019-12-27 DIAGNOSIS — J301 Allergic rhinitis due to pollen: Secondary | ICD-10-CM | POA: Diagnosis not present

## 2020-01-03 DIAGNOSIS — M5116 Intervertebral disc disorders with radiculopathy, lumbar region: Secondary | ICD-10-CM | POA: Diagnosis not present

## 2020-01-03 DIAGNOSIS — Z96642 Presence of left artificial hip joint: Secondary | ICD-10-CM | POA: Diagnosis not present

## 2020-01-03 DIAGNOSIS — R269 Unspecified abnormalities of gait and mobility: Secondary | ICD-10-CM | POA: Diagnosis not present

## 2020-01-03 DIAGNOSIS — G4733 Obstructive sleep apnea (adult) (pediatric): Secondary | ICD-10-CM | POA: Diagnosis not present

## 2020-01-03 DIAGNOSIS — J3089 Other allergic rhinitis: Secondary | ICD-10-CM | POA: Diagnosis not present

## 2020-01-03 DIAGNOSIS — J449 Chronic obstructive pulmonary disease, unspecified: Secondary | ICD-10-CM | POA: Diagnosis not present

## 2020-01-03 DIAGNOSIS — E1140 Type 2 diabetes mellitus with diabetic neuropathy, unspecified: Secondary | ICD-10-CM | POA: Diagnosis not present

## 2020-01-03 DIAGNOSIS — J301 Allergic rhinitis due to pollen: Secondary | ICD-10-CM | POA: Diagnosis not present

## 2020-01-03 DIAGNOSIS — J3081 Allergic rhinitis due to animal (cat) (dog) hair and dander: Secondary | ICD-10-CM | POA: Diagnosis not present

## 2020-01-03 DIAGNOSIS — F331 Major depressive disorder, recurrent, moderate: Secondary | ICD-10-CM | POA: Diagnosis not present

## 2020-01-04 ENCOUNTER — Other Ambulatory Visit: Payer: Self-pay | Admitting: Internal Medicine

## 2020-01-04 DIAGNOSIS — Z1231 Encounter for screening mammogram for malignant neoplasm of breast: Secondary | ICD-10-CM

## 2020-01-08 DIAGNOSIS — J3081 Allergic rhinitis due to animal (cat) (dog) hair and dander: Secondary | ICD-10-CM | POA: Diagnosis not present

## 2020-01-08 DIAGNOSIS — J301 Allergic rhinitis due to pollen: Secondary | ICD-10-CM | POA: Diagnosis not present

## 2020-01-08 DIAGNOSIS — J3089 Other allergic rhinitis: Secondary | ICD-10-CM | POA: Diagnosis not present

## 2020-01-10 ENCOUNTER — Ambulatory Visit: Payer: Medicaid Other | Admitting: Podiatry

## 2020-01-10 ENCOUNTER — Other Ambulatory Visit: Payer: Self-pay

## 2020-01-10 DIAGNOSIS — M79676 Pain in unspecified toe(s): Secondary | ICD-10-CM

## 2020-01-10 DIAGNOSIS — B351 Tinea unguium: Secondary | ICD-10-CM

## 2020-01-10 DIAGNOSIS — E1142 Type 2 diabetes mellitus with diabetic polyneuropathy: Secondary | ICD-10-CM

## 2020-01-10 DIAGNOSIS — F331 Major depressive disorder, recurrent, moderate: Secondary | ICD-10-CM | POA: Diagnosis not present

## 2020-01-10 DIAGNOSIS — M2011 Hallux valgus (acquired), right foot: Secondary | ICD-10-CM

## 2020-01-10 DIAGNOSIS — M2012 Hallux valgus (acquired), left foot: Secondary | ICD-10-CM

## 2020-01-11 ENCOUNTER — Ambulatory Visit: Payer: Medicaid Other | Admitting: Specialist

## 2020-01-12 ENCOUNTER — Other Ambulatory Visit: Payer: Self-pay

## 2020-01-12 ENCOUNTER — Ambulatory Visit: Payer: Medicaid Other | Attending: Internal Medicine | Admitting: Internal Medicine

## 2020-01-12 ENCOUNTER — Encounter: Payer: Self-pay | Admitting: Internal Medicine

## 2020-01-12 DIAGNOSIS — I10 Essential (primary) hypertension: Secondary | ICD-10-CM | POA: Diagnosis not present

## 2020-01-12 DIAGNOSIS — F17200 Nicotine dependence, unspecified, uncomplicated: Secondary | ICD-10-CM

## 2020-01-12 DIAGNOSIS — E1142 Type 2 diabetes mellitus with diabetic polyneuropathy: Secondary | ICD-10-CM

## 2020-01-12 DIAGNOSIS — J432 Centrilobular emphysema: Secondary | ICD-10-CM

## 2020-01-12 DIAGNOSIS — R911 Solitary pulmonary nodule: Secondary | ICD-10-CM

## 2020-01-12 NOTE — Progress Notes (Signed)
Virtual Visit via Telephone Note  I connected with Stacy [name] on 01/12/20 at12:24 p.m by telephone and verified that I am speaking with the correct person using two identifiers.  Location: Patient: home Provider: office   I discussed the limitations, risks, security and privacy concerns of performing an evaluation and management service by telephone and the availability of in person appointments. I also discussed with the patient that there may be a patient responsible charge related to this service. The patient expressed understanding and agreed to proceed.   History of Present Illness: Pt with hx of HTN, DM with neuropathy,tob dep,HL,PAD,IDA,lupus, spinal stenosis with neurogenic claudication(s/p laminectomy 07/2018),COPD, RLS,OA knees,gout, recurrent sinusitis, receiving allergy shots from Dr. Faith Rogue.  Saw Dr. [name] in September for knees and back.  Weight loss encouraged.  She has an upcoming appointment with him.  DM:  Checking BS TID before meals.  BS range 110-114 Doing well with eating habits. Reports she does a lot of walking.  Walks to her mother's house and back QOD. It is 4 blocks one way.  Compliant with Amaryl.  HTN:  Checking BP daily.  This a.m reading was 114/60s Limits salt in foods.   No CP.  SOB sometimes which she relates more to her COPD.  No headaches or dizziness.  Reports compliance with medications that include Diovan/HCTZ 60/12.5 and HCTZ 25 mg  COPD:  Smoking more since last visit.  Smoking 5-10 cigarettes/day.  Stress triggers her smoking.  Using patches and still desires to quit.  Using Symbicort and Spiriva -6 mm RUL lung nodule seen on CT 08/2019.  Radiologist recommended repeat CT in 6 to 12 months to ensure stability.  She denies any hemoptysis.  Coughs sometimes.  OSA:  Using CPAP consistently since getting it in 07/2019.  Got machine through [facility].   Outpatient Encounter Medications as of 01/12/2020  Medication Sig   . Accu-Chek Softclix Lancets lancets USE AS INSTRUCTED 3 TIMES A DAY  . albuterol (PROVENTIL) (2.5 MG/3ML) 0.083% nebulizer solution USE ONE VIAL (2.5 MG TOTAL) BY NEBULIZATION EVERY 6 (SIX) HOURS AS NEEDED FOR WHEEZING.  . ALLERGY RELIEF 10 MG tablet TAKE 1 TABLET (10 MG TOTAL) BY MOUTH DAILY.  Marland Kitchen allopurinol (ZYLOPRIM) 100 MG tablet TAKE 1 TABLET (100 MG TOTAL) BY MOUTH DAILY.  Marland Kitchen atorvastatin (LIPITOR) 20 MG tablet Take 1 tablet (20 mg total) by mouth daily.  . Blood Glucose Monitoring Suppl (ACCU-CHEK AVIVA PLUS) w/Device KIT 1 each by Does not apply route 3 (three) times daily.  . budesonide-formoterol (SYMBICORT) 80-4.5 MCG/ACT inhaler INHALE TWO PUFFS BY MOUTH TWICE A DAY (Patient taking differently: Inhale 2 puffs into the lungs 2 (two) times a day. )  . clopidogrel (PLAVIX) 75 MG tablet TAKE 1 TABLET (75 MG TOTAL) BY MOUTH DAILY.  . DULoxetine (CYMBALTA) 60 MG capsule Take 1 capsule (60 mg total) by mouth daily.  Marland Kitchen EPINEPHrine 0.3 mg/0.3 mL IJ SOAJ injection 0.3 mg IM x 1 PRN for allergic reaction (Patient taking differently: Inject 0.3 mg into the muscle once as needed for anaphylaxis. )  . [name] 325 (65 Fe) MG tablet TAKE 1 TABLET BY MOUTH 2 (TWO) TIMES DAILY WITH A MEAL.  . fluticasone (FLONASE) 50 MCG/ACT nasal spray Place 1 spray into both nostrils daily.  Marland Kitchen gabapentin (NEURONTIN) 600 MG tablet TAKE ONE TABLET BY MOUTH THREE TIMES A DAY.  Marland Kitchen glimepiride (AMARYL) 2 MG tablet TAKE 1 TABLET (2 MG TOTAL) BY MOUTH DAILY.  Marland Kitchen glucose blood (ACCU-CHEK  AVIVA PLUS) test strip USE 3 TIMES A DAY AS DIRECTED BY PHYSICIAN  . hydrochlorothiazide (HYDRODIURIL) 25 MG tablet TAKE 1 TABLET (25 MG TOTAL) BY MOUTH DAILY.  . hydrOXYzine (ATARAX/VISTARIL) 10 MG tablet TAKE 1 TABLET IN THE MORNING AND NOON AND 2 TABLETS IN THE EVENING AS NEEDED  . Lancets (ACCU-CHEK SOFT TOUCH) lancets Use as instructed  . Melatonin 3 MG TABS Take 1 tablet (3 mg total) by mouth at bedtime as needed. (Patient taking  differently: Take 3 mg by mouth at bedtime as needed (sleep). )  . meloxicam (MOBIC) 15 MG tablet Take 1 tablet (15 mg total) by mouth daily.  . methocarbamol (ROBAXIN) 750 MG tablet Take 1 tablet (750 mg total) by mouth every 12 (twelve) hours as needed for muscle spasms.  . montelukast (SINGULAIR) 10 MG tablet Take 10 mg by mouth at bedtime.   . Multiple Vitamin (MULTIVITAMIN WITH MINERALS) TABS tablet Take 1 tablet by mouth daily. CENTRUM SILVER 50 plus  . nicotine (NICODERM CQ - DOSED IN MG/24 HOURS) 14 mg/24hr patch PLACE 1 PATCH ONTO THE SKIN DAILY  . ondansetron (ZOFRAN ODT) 4 MG disintegrating tablet Take 1 tablet (4 mg total) by mouth every 8 (eight) hours as needed for nausea or vomiting.  . pantoprazole (PROTONIX) 40 MG tablet Take 1 tablet (40 mg total) by mouth daily.  . Potassium Chloride ER 20 MEQ TBCR Take 20 mEq by mouth daily.  . potassium chloride SA (KLOR-CON) 20 MEQ tablet TAKE ONE TABLET BY MOUTH ONCE DAILY  . Respiratory Therapy Supplies (FLUTTER) DEVI Use after breathing treatment 4 times daily  . senna-docusate (SENOKOT S) 8.6-50 MG tablet Take 1 tablet by mouth daily as needed for mild constipation.  Marland Kitchen SPIRIVA HANDIHALER 18 MCG inhalation capsule PLACE 1 CAPSULE (18 MCG TOTAL) INTO INHALER AND INHALE DAILY.  Marland Kitchen sucralfate (CARAFATE) 1 g tablet Take 1 g by mouth 4 (four) times daily -  with meals and at bedtime.  . triamcinolone cream (KENALOG) 0.1 % Apply 1 application topically 2 (two) times daily as needed (lupus rash).   . valsartan-hydrochlorothiazide (DIOVAN-HCT) 160-12.5 MG tablet TAKE ONE TABLET BY MOUTH ONCE DAILY  . [DISCONTINUED] Fluticasone-Salmeterol (ADVAIR) 500-50 MCG/DOSE AEPB Inhale 1 puff into the lungs every 12 (twelve) hours.    . [DISCONTINUED] lisinopril (PRINIVIL,ZESTRIL) 40 MG tablet Take 40 mg by mouth daily.     No facility-administered encounter medications on file as of 01/12/2020.      Observations/Objective: No direct observation done as  this was a telephone visit.  Assessment and Plan: 1. Type 2 diabetes mellitus with diabetic polyneuropathy, without long-term current use of insulin ([place]) Reported blood sugar readings are within range.  She will continue Amaryl.  Encourage healthy eating habits.  Commended her on moving more. - Hemoglobin A1c; Future - CBC; Future  2. Essential hypertension Reported home blood pressure readings are at goal range.  Continue current medications and low-salt diet.  3. Tobacco use disorder Encouraged her to quit.  She is aware of the health risks and knows that it does not help her COPD.  She states that she is working on cutting back again with use of the nicotine patches.  Encouraged her to set a quit date.  Less than 5 minutes spent on counseling  4. Incidental lung nodule, > 72m and < 844mI will plan to schedule on her follow-up visit with me in 4 months  5. Centrilobular emphysema (HCC) Continue Symbicort and Spiriva.  Encourage smoking cessation.  Follow Up Instructions: 4 mths   I discussed the assessment and treatment plan with the patient. The patient was provided an opportunity to ask questions and all were answered. The patient agreed with the plan and demonstrated an understanding of the instructions.   The patient was advised to call back or seek an in-person evaluation if the symptoms worsen or if the condition fails to improve as anticipated.  I provided 13 minutes of non-face-to-face time during this encounter.   Karle Plumber, MD

## 2020-01-15 ENCOUNTER — Ambulatory Visit: Payer: Medicaid Other | Attending: Internal Medicine

## 2020-01-15 ENCOUNTER — Other Ambulatory Visit: Payer: Self-pay

## 2020-01-15 ENCOUNTER — Encounter: Payer: Self-pay | Admitting: Podiatry

## 2020-01-15 DIAGNOSIS — J301 Allergic rhinitis due to pollen: Secondary | ICD-10-CM | POA: Diagnosis not present

## 2020-01-15 DIAGNOSIS — E1142 Type 2 diabetes mellitus with diabetic polyneuropathy: Secondary | ICD-10-CM | POA: Diagnosis not present

## 2020-01-15 DIAGNOSIS — J3089 Other allergic rhinitis: Secondary | ICD-10-CM | POA: Diagnosis not present

## 2020-01-15 DIAGNOSIS — J3081 Allergic rhinitis due to animal (cat) (dog) hair and dander: Secondary | ICD-10-CM | POA: Diagnosis not present

## 2020-01-15 NOTE — Progress Notes (Signed)
Subjective: Stacy [name] is a 59 y.o. female patient seen today at [facility] foot care with history of diabetic neuropathy and painful mycotic nails b/l that are difficult to trim. Pain interferes with ambulation. Aggravating factors include wearing enclosed shoe gear. Pain is relieved with periodic professional debridement.   She voices no new pedal concerns on today's visit.  She did have vascular studies performed by Dr. Gwenlyn Found in August..  Patient Active Problem List   Diagnosis Date Noted  . Paresthesia of skin 11/16/2019  . History of total knee replacement, right 11/16/2019  . Tobacco abuse 11/16/2019  . Centrilobular emphysema ([place]) 08/10/2019  . Incidental lung nodule, > 61m and < 873m07/09/2019  . OSA on CPAP 08/10/2019  . Dyspnea on exertion 08/02/2019  . Hyperlipidemia 08/02/2019  . Lumbar radiculopathy 04/24/2019  . 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  . Post laminectomy syndrome 02/23/2019  . Abnormality of gait 02/23/2019  . 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 Unilateral primary osteoarthritis, left hip 12/28/2018  . Lesion of skin of left ear 12/26/2018  . Primary osteoarthritis of left hip 12/02/2018  . Iron deficiency anemia 10/16/2018  . Chronic pain syndrome 09/08/2018  . Chronic pain of right knee 08/11/2018  . Status post lumbar laminectomy 07/15/2018  . Peripheral arterial disease ([place]03/04/2018  . Moderate persistent asthma without complication 0507/[id number]/0881. Environmental and seasonal allergies 06/29/2017  . Controlled type 2 diabetes mellitus with diabetic polyneuropathy, without long-term current use of insulin ([facility] [place]05/28/2019  . Perennial allergic rhinitis 04/08/2017  . Sensorineural hearing loss (SNHL), bilateral 04/08/2017  . Chronic pansinusitis 03/25/2017  . Eustachian tube dysfunction, bilateral 03/25/2017  . Lichen planopilaris 0910/31/5945. Herniation of lumbar intervertebral disc with radiculopathy 10/02/2016    Class: Chronic   . Alopecia areata 08/19/2016  . Chondromalacia of both patellae 06/03/2015    Class: Chronic  . Spinal stenosis, lumbar region, with neurogenic claudication 06/03/2015  . Tobacco use disorder 04/25/2015  . DJD (degenerative joint disease) of knee 01/04/2015  . Hemorrhoid 11/14/2014  . Gout of big toe 07/19/2014  . Essential hypertension 08/14/2013  . Gastroesophageal reflux disease without esophagitis 08/14/2013  . COPD (chronic obstructive pulmonary disease) ([facility]03/15/2013    Current Outpatient Medications on File Prior to Visit  Medication Sig Dispense Refill  . Accu-Chek Softclix Lancets lancets USE AS INSTRUCTED 3 TIMES A DAY 100 each 12  . albuterol (PROVENTIL) (2.5 MG/3ML) 0.083% nebulizer solution USE ONE VIAL (2.5 MG TOTAL) BY NEBULIZATION EVERY 6 (SIX) HOURS AS NEEDED FOR WHEEZING. 300 mL 0  . ALLERGY RELIEF 10 MG tablet TAKE 1 TABLET (10 MG TOTAL) BY MOUTH DAILY. 30 tablet 11  . allopurinol (ZYLOPRIM) 100 MG tablet TAKE 1 TABLET (100 MG TOTAL) BY MOUTH DAILY. 90 tablet 3  . atorvastatin (LIPITOR) 20 MG tablet Take 1 tablet (20 mg total) by mouth daily. 30 tablet 6  . Blood Glucose Monitoring Suppl (ACCU-CHEK AVIVA PLUS) w/Device KIT 1 each by Does not apply route 3 (three) times daily. 1 kit 0  . budesonide-formoterol (SYMBICORT) 80-4.5 MCG/ACT inhaler INHALE TWO PUFFS BY MOUTH TWICE A DAY (Patient taking differently: Inhale 2 puffs into the lungs 2 (two) times a day. ) 1 Inhaler 11  . clopidogrel (PLAVIX) 75 MG tablet TAKE 1 TABLET (75 MG TOTAL) BY MOUTH DAILY. 30 tablet 2  . DULoxetine (CYMBALTA) 60 MG capsule Take 1 capsule (60 mg total) by mouth daily. 30 capsule 3  . EPINEPHrine 0.3 mg/0.3  mL IJ SOAJ injection 0.3 mg IM x 1 PRN for allergic reaction (Patient taking differently: Inject 0.3 mg into the muscle once as needed for anaphylaxis. ) 1 Device 1  . [name] 325 (65 Fe) MG tablet TAKE 1 TABLET BY MOUTH 2 (TWO) TIMES DAILY WITH A MEAL. 60 tablet 0  . fluticasone  (FLONASE) 50 MCG/ACT nasal spray Place 1 spray into both nostrils daily. 16 g 6  . gabapentin (NEURONTIN) 600 MG tablet TAKE ONE TABLET BY MOUTH THREE TIMES A DAY. 90 tablet 1  . glimepiride (AMARYL) 2 MG tablet TAKE 1 TABLET (2 MG TOTAL) BY MOUTH DAILY. 90 tablet 1  . glucose blood (ACCU-CHEK AVIVA PLUS) test strip USE 3 TIMES A DAY AS DIRECTED BY PHYSICIAN 100 each 12  . hydrochlorothiazide (HYDRODIURIL) 25 MG tablet TAKE 1 TABLET (25 MG TOTAL) BY MOUTH DAILY. 90 tablet 3  . hydrOXYzine (ATARAX/VISTARIL) 10 MG tablet TAKE 1 TABLET IN THE MORNING AND NOON AND 2 TABLETS IN THE EVENING AS NEEDED 120 tablet 2  . Lancets (ACCU-CHEK SOFT TOUCH) lancets Use as instructed 100 each 12  . Melatonin 3 MG TABS Take 1 tablet (3 mg total) by mouth at bedtime as needed. (Patient taking differently: Take 3 mg by mouth at bedtime as needed (sleep). ) 30 tablet 3  . meloxicam (MOBIC) 15 MG tablet Take 1 tablet (15 mg total) by mouth daily. 30 tablet 3  . methocarbamol (ROBAXIN) 750 MG tablet Take 1 tablet (750 mg total) by mouth every 12 (twelve) hours as needed for muscle spasms. 40 tablet 1  . montelukast (SINGULAIR) 10 MG tablet Take 10 mg by mouth at bedtime.     . Multiple Vitamin (MULTIVITAMIN WITH MINERALS) TABS tablet Take 1 tablet by mouth daily. CENTRUM SILVER 50 plus    . nicotine (NICODERM CQ - DOSED IN MG/24 HOURS) 14 mg/24hr patch PLACE 1 PATCH ONTO THE SKIN DAILY 28 patch 0  . ondansetron (ZOFRAN ODT) 4 MG disintegrating tablet Take 1 tablet (4 mg total) by mouth every 8 (eight) hours as needed for nausea or vomiting. 20 tablet 0  . pantoprazole (PROTONIX) 40 MG tablet Take 1 tablet (40 mg total) by mouth daily. 30 tablet 2  . Potassium Chloride ER 20 MEQ TBCR Take 20 mEq by mouth daily. 30 tablet 6  . potassium chloride SA (KLOR-CON) 20 MEQ tablet TAKE ONE TABLET BY MOUTH ONCE DAILY 90 tablet 1  . Respiratory Therapy Supplies (FLUTTER) DEVI Use after breathing treatment 4 times daily 1 each 0  .  senna-docusate (SENOKOT S) 8.6-50 MG tablet Take 1 tablet by mouth daily as needed for mild constipation. 30 tablet 2  . SPIRIVA HANDIHALER 18 MCG inhalation capsule PLACE 1 CAPSULE (18 MCG TOTAL) INTO INHALER AND INHALE DAILY. 30 capsule 2  . sucralfate (CARAFATE) 1 g tablet Take 1 g by mouth 4 (four) times daily -  with meals and at bedtime.    . triamcinolone cream (KENALOG) 0.1 % Apply 1 application topically 2 (two) times daily as needed (lupus rash).     . valsartan-hydrochlorothiazide (DIOVAN-HCT) 160-12.5 MG tablet TAKE ONE TABLET BY MOUTH ONCE DAILY 30 tablet 2  . [DISCONTINUED] Fluticasone-Salmeterol (ADVAIR) 500-50 MCG/DOSE AEPB Inhale 1 puff into the lungs every 12 (twelve) hours.      . [DISCONTINUED] lisinopril (PRINIVIL,ZESTRIL) 40 MG tablet Take 40 mg by mouth daily.       No current facility-administered medications on file prior to visit.    Allergies  Allergen  Reactions  . Other Shortness Of Breath    UNSPECIFIED AGENTS Allergic to perfumes and cleaning products  . Shellfish Allergy Anaphylaxis    Per allergy test.  . Ace Inhibitors Cough and Other (See Comments)       . Aspirin Nausea Only  Objective: Physical Exam  General: Patient is a pleasant 59 y.o. African American female in no acute distress, Awake, alert and oriented x 3  Neurovascular status unchanged b/l.  Capillary refill time to digits <4 seconds b/l. Faintly palpable DP pulses b/l. PT pulse faintly palpable left foot. PT pulse nonpalpable right foot. Pedal hair absent b/l. Skin temperature gradient within normal limits b/l. No pain with calf compression b/l. No edema noted b/l.   Pt has subjective symptoms of neuropathy. Protective sensation intact 5/5 intact bilaterally with 10g monofilament b/l. Vibratory sensation intact b/l. Proprioception intact bilaterally.  Dermatological:  Pedal skin with normal turgor, texture and tone bilaterally. No open wounds bilaterally. No interdigital macerations  bilaterally. Toenails 1-5 b/l elongated, discolored, dystrophic, thickened, crumbly with subungual debris and tenderness to dorsal palpation.   Musculoskeletal:  Normal muscle strength 5/5 to all lower extremity muscle groups bilaterally. No pain crepitus or joint limitation noted with ROM b/l. Hallux valgus with bunion deformity noted b/l lower extremities. Patient ambulates independent of any assistive aids.   ABI Report 09/05/2019: TBI: right 0.88; left 0.83 ABI: right 1.20; left 1.31  Assessment and Plan:  1. Pain due to onychomycosis of toenail   2. Hallux valgus, acquired, bilateral   3. Diabetic peripheral neuropathy associated with type 2 diabetes mellitus ([place])     -Examined patient. -ABIs essentially unchanged from previous exam. TBIs increased. Continue to monitor. -Continue diabetic foot care principles. Literature dispensed on today.  -Toenails 1-5 b/l were debrided in length and girth with sterile nail nippers and dremel without iatrogenic bleeding.  -Patient to continue soft, supportive shoe gear daily. -Patient to report any pedal injuries to medical professional immediately. -Patient/POA to call should there be question/concern in the interim.  Return in about 3 months (around 04/09/2020) for diabetic foot care.  Marzetta Board, DPM

## 2020-01-16 ENCOUNTER — Ambulatory Visit
Admission: RE | Admit: 2020-01-16 | Discharge: 2020-01-16 | Disposition: A | Discharge status: Home / Self Care | Payer: Medicaid Other | Source: Ambulatory Visit | Attending: Internal Medicine | Admitting: Internal Medicine

## 2020-01-16 ENCOUNTER — Other Ambulatory Visit: Payer: Self-pay | Admitting: Internal Medicine

## 2020-01-16 DIAGNOSIS — Z1231 Encounter for screening mammogram for malignant neoplasm of breast: Secondary | ICD-10-CM | POA: Diagnosis not present

## 2020-01-16 DIAGNOSIS — I739 Peripheral vascular disease, unspecified: Secondary | ICD-10-CM

## 2020-01-16 DIAGNOSIS — E1142 Type 2 diabetes mellitus with diabetic polyneuropathy: Secondary | ICD-10-CM

## 2020-01-16 DIAGNOSIS — I10 Essential (primary) hypertension: Secondary | ICD-10-CM

## 2020-01-16 DIAGNOSIS — E119 Type 2 diabetes mellitus without complications: Secondary | ICD-10-CM

## 2020-01-16 DIAGNOSIS — F17200 Nicotine dependence, unspecified, uncomplicated: Secondary | ICD-10-CM

## 2020-01-16 LAB — CBC
Hematocrit: 36.4 % (ref 34.0–46.6)
Hemoglobin: 12.2 g/dL (ref 11.1–15.9)
MCH: 28 pg (ref 26.6–33.0)
MCHC: 33.5 g/dL (ref 31.5–35.7)
MCV: 84 fL (ref 79–97)
Platelets: 295 10*3/uL (ref 150–450)
RBC: 4.36 x10E6/uL (ref 3.77–5.28)
RDW: 13.5 % (ref 11.7–15.4)
WBC: 7.1 10*3/uL (ref 3.4–10.8)

## 2020-01-16 LAB — HEMOGLOBIN A1C
Est. average glucose Bld gHb Est-mCnc: 154 mg/dL
Hgb A1c MFr Bld: 7 % — ABNORMAL HIGH (ref 4.8–5.6)

## 2020-01-17 ENCOUNTER — Other Ambulatory Visit: Payer: Self-pay

## 2020-01-17 ENCOUNTER — Other Ambulatory Visit: Payer: Self-pay | Admitting: Surgery

## 2020-01-17 DIAGNOSIS — Z79899 Other long term (current) drug therapy: Secondary | ICD-10-CM

## 2020-01-17 DIAGNOSIS — E782 Mixed hyperlipidemia: Secondary | ICD-10-CM | POA: Diagnosis not present

## 2020-01-17 DIAGNOSIS — F331 Major depressive disorder, recurrent, moderate: Secondary | ICD-10-CM | POA: Diagnosis not present

## 2020-01-17 LAB — LIPID PANEL
Chol/HDL Ratio: 3.7 ratio (ref 0.0–4.4)
Cholesterol, Total: 160 mg/dL (ref 100–199)
HDL: 43 mg/dL (ref >39)
LDL Chol Calc (NIH): 85 mg/dL (ref 0–99)
Triglycerides: 191 mg/dL — ABNORMAL HIGH (ref 0–149)
VLDL Cholesterol Cal: 32 mg/dL (ref 5–40)

## 2020-01-18 ENCOUNTER — Ambulatory Visit (INDEPENDENT_AMBULATORY_CARE_PROVIDER_SITE_OTHER): Payer: Medicaid Other | Admitting: Surgery

## 2020-01-18 ENCOUNTER — Other Ambulatory Visit: Payer: Self-pay

## 2020-01-18 ENCOUNTER — Ambulatory Visit: Payer: Self-pay

## 2020-01-18 ENCOUNTER — Encounter: Payer: Self-pay | Admitting: Surgery

## 2020-01-18 VITALS — BP 146/79 | HR 93

## 2020-01-18 DIAGNOSIS — M533 Sacrococcygeal disorders, not elsewhere classified: Secondary | ICD-10-CM | POA: Diagnosis not present

## 2020-01-18 DIAGNOSIS — M48062 Spinal stenosis, lumbar region with neurogenic claudication: Secondary | ICD-10-CM | POA: Diagnosis not present

## 2020-01-18 DIAGNOSIS — G8929 Other chronic pain: Secondary | ICD-10-CM

## 2020-01-18 DIAGNOSIS — M25562 Pain in left knee: Secondary | ICD-10-CM

## 2020-01-18 NOTE — Progress Notes (Signed)
Subjective: Patient is here for ultrasound-guided intra-articular bilateral SI joint injection.     Objective: Point tender over both SI joints.  Procedure: Ultrasound guided injection is preferred based studies that show increased duration, increased effect, greater accuracy, decreased procedural pain, increased response rate, and decreased cost with ultrasound guided versus blind injection.   Verbal informed consent obtained.  Time-out conducted.  Noted no overlying erythema, induration, or other signs of local infection. Ultrasound-guided bilateral SI injection: After sterile prep with Betadine, injected 4 cc 0.25% bupivocaine without epinephrine and 6 mg betamethasone using a 22-gauge spinal needle, passing the needle through the sacroiliac ligament into the region of the SI joint.  She had very good pain relief during the immediate anesthetic phase.

## 2020-01-18 NOTE — Progress Notes (Signed)
Office Visit Note   Patient: Stacy [name]           Date of Birth: Apr 17, 1960           MRN: [id number] Visit Date: 01/18/2020              Requested by: Marcine Matar, MD [address] [place],  [place] [address] PCP: Marcine Matar, MD   Assessment & Plan: Visit Diagnoses:  1. Left knee pain, unspecified chronicity   2. Spinal stenosis, lumbar region, with neurogenic claudication   3. Chronic SI joint pain   4. Chronic right SI joint pain   5. Chronic left SI joint pain     Plan: For patient's bilateral SI joint pain I asked Dr. Casimiro Needle Hilts in our office to perform ultrasound-guided bilateral SI joint Marcaine/Depo-Medrol diagnostic/therapeutic injections.  Advised patient to pay close attention to how she feels after this is done and let Dr. [name] know these results when she returns to see him in 4 weeks.  With her ongoing left knee pain and mechanical symptoms that failed conservative treatment with previous intra-articular injection recommend getting an MRI to evaluate the extent of her DJD and also rule out medial meniscal tear.  Dr. [name] will also review these results and discuss further treatment options when she returns.  Follow-Up Instructions: Return in about 4 weeks (around 02/15/2020) for with dr [name] to discuss lumbar surgery and review left knee mri scan.   Orders:  Orders Placed This Encounter  Procedures  . XR KNEE 3 VIEW LEFT  . US Guided Needle Placement - No Linked Charges  . MR Knee Left w/o contrast   No orders of the defined types were placed in this encounter.     Procedures: No procedures performed   Clinical Data: No additional findings.   Subjective: Chief Complaint  Patient presents with  . Lower Back - Pain    HPI 59 year-old black female returns for recheck of her low back pain and lower extremity radiculopathy.  Patient was last seen with Dr. Bing Matter a few months ago.  She has known history of lumbar spondylosis  and most recent lumbar MRI performed August 22, 2019 showed:  EXAM: MRI LUMBAR SPINE WITHOUT AND WITH CONTRAST  TECHNIQUE: Multiplanar and multiecho pulse sequences of the lumbar spine were obtained without and with intravenous contrast.  CONTRAST:  45mL MULTIHANCE GADOBENATE DIMEGLUMINE 529 MG/ML IV SOLN  COMPARISON:  X-ray 04/26/2019, MRI 04/20/2018  FINDINGS: Segmentation:  Standard.  Alignment: Trace retrolisthesis L3 on L4. Straightening of the lumbar lordosis.  Vertebrae: No fracture, evidence of discitis, or bone lesion. Multilevel discogenic endplate marrow changes most pronounced and slightly progressed at the L3-4 level.  Conus medullaris and cauda equina: Conus extends to the L1-2 level. Conus and cauda equina appear normal.  Paraspinal and other soft tissues: No acute findings.  Disc levels:  T12-L1: Unremarkable.  L1-L2: No disc protrusion. Mild ligamentum flavum buckling. No foraminal or canal stenosis. Unchanged.  L2-L3: Mild diffuse disc bulge results in mild bilateral subarticular recess stenosis and mild bilateral foraminal stenosis. No significant canal stenosis. Unchanged.  L3-L4: Prior laminectomy changes. Circumferential disc bulge which is eccentric to the left has slightly regressed overall compared to previous MRI. Moderate to severe bilateral foraminal stenosis is similar to slightly progressed from prior. Mild left-sided perineural enhancement. Interval decrease in the degree of bilateral subarticular recess stenosis, now mild. No significant canal stenosis.  L4-L5: Prior laminectomy changes. Disc height  loss with mild circumferential disc bulge. No residual canal stenosis. Similar degree of moderate bilateral foraminal stenosis.  L5-S1: Prior laminectomy changes. Mild diffuse disc bulge. No residual canal stenosis. Similar degree of moderate right and mild left foraminal stenosis. Mild epidural enhancement at the  right posterior thecal sac. No abnormal perineural enhancement.  IMPRESSION: 1. 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 2. Prior laminectomy changes at the L3-4 through L5-S1 levels. No residual canal stenosis at these levels. 3. Similar degree of moderate bilateral foraminal stenosis at L4-5 and on the right at L5-S1.   Electronically Signed   By: Duanne Guess D.O.   On: 08/30/2019 17:02   Chest pain that radiates from the low back down into the bilateral thighs.  Most of her low back pain is localized to the bilateral SI joints.  Pain when she is ambulating and sitting.  She also continues have ongoing issues with her left knee.  Dr. [name] did left knee intra-articular injection last office visit.  This gave temporary improvement.  She describes having pain mechanical symptoms.  States the knee is giving out.   Review of Systems No current cardiac pulmonary GI GU issues  Objective: Vital Signs: BP (!) 146/79   Pulse 93   LMP 09/01/2010   Physical Exam HENT:     Head: Normocephalic.  Eyes:     Extraocular Movements: Extraocular movements intact.  Pulmonary:     Effort: No respiratory distress.  Musculoskeletal:     Comments: Gait is antalgic.  Marked tenderness over the bilateral SI joints.  Negative logroll bilateral hips.  Left knee good range of motion.  Some swelling.  Tenderness at the medial joint line.  Positive McMurray's test.  Ligaments are stable.  Neurological:     Mental Status: She is alert and oriented to person, place, and time.  Psychiatric:        Mood and Affect: Mood normal.     Ortho Exam  Specialty Comments:  No specialty comments available.  Imaging: US Guided Needle Placement - No Linked Charges  Result Date: 01/18/2020 Ultrasound guided injection is preferred based studies that show increased duration, increased effect, greater accuracy, decreased procedural pain,  increased response rate, and decreased cost with ultrasound guided versus blind injection.   Verbal informed consent obtained.  Time-out conducted.  Noted no overlying erythema, induration, or other signs of local infection. Ultrasound-guided bilateral SI injection: After sterile prep with Betadine, injected 4 cc 0.25% bupivocaine without epinephrine and 6 mg betamethasone using a 22-gauge spinal needle, passing the needle through the sacroiliac ligament into the region of the SI joint.  She had very good pain relief during the immediate anesthetic phase.    PMFS History: Patient Active Problem List   Diagnosis Date Noted  . Paresthesia of skin 11/16/2019  . History of total knee replacement, right 11/16/2019  . Tobacco abuse 11/16/2019  . Centrilobular emphysema ([facility]) 08/10/2019  . Incidental lung nodule, > 48mm and < 86mm 08/10/2019  . OSA on CPAP 08/10/2019  . Dyspnea on exertion 08/02/2019  . Hyperlipidemia 08/02/2019  . Lumbar radiculopathy 04/24/2019  . 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  . Post laminectomy syndrome 02/23/2019  . Abnormality of gait 02/23/2019  . 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 Unilateral primary osteoarthritis, left hip 12/28/2018  . Lesion of skin of left ear 12/26/2018  . Primary osteoarthritis of left hip 12/02/2018  . Iron deficiency anemia 10/16/2018  . Chronic  pain syndrome 09/08/2018  . Chronic pain of right knee 08/11/2018  . Status post lumbar laminectomy 07/15/2018  . Peripheral arterial disease ([facility]) 04/05/2018  . Moderate persistent asthma without complication 06/29/2017  . Environmental and seasonal allergies 06/29/2017  . Controlled type 2 diabetes mellitus with diabetic polyneuropathy, without long-term current use of insulin ([facility]) 06/29/2017  . Perennial allergic rhinitis 04/08/2017  . Sensorineural hearing loss (SNHL), bilateral 04/08/2017  . Chronic pansinusitis 03/25/2017  . Eustachian tube dysfunction, bilateral 03/25/2017  .  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 Herniation of lumbar intervertebral disc with radiculopathy 10/02/2016    Class: Chronic  . Alopecia areata 08/19/2016  . Chondromalacia of both patellae 06/03/2015    Class: Chronic  . Spinal stenosis, lumbar region, with neurogenic claudication 06/03/2015  . Tobacco use disorder 04/25/2015  . DJD (degenerative joint disease) of knee 01/04/2015  . Hemorrhoid 11/14/2014  . Gout of big toe 07/19/2014  . Essential hypertension 08/14/2013  . Gastroesophageal reflux disease without esophagitis 08/14/2013  . COPD (chronic obstructive pulmonary disease) ([facility]) 04/17/2011   Past Medical History:  Diagnosis Date  . Allergy    Shellfish, cleaning products  . Anxiety   . Arthritis   . 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 Asthma   . COPD (chronic obstructive pulmonary disease) ([facility])   . Depression   . Diabetes mellitus    Type II  . GERD (gastroesophageal reflux disease)    Pt on Protonix daily  . Glaucoma   . Gout   . Headache(784.0)    otc meds prn  . Hyperlipidemia   . Hypertension   . Irritable bowel syndrome 11/19/2010  . Neuropathy   . Pneumonia YRS AGO  . Restless legs   . Shortness of breath    07/14/2018- uses  4 times a day    Family History  Problem Relation Age of Onset  . Hypertension Father   . Cancer Father   . Heart disease Mother   . Asthma Son        had as a child  . Heart disease Sister   . Breast cancer Sister   . Hypertension Brother     Past Surgical History:  Procedure Laterality Date  . BACK SURGERY    . CHOLECYSTECTOMY    . COLONOSCOPY    . ENDOMETRIAL ABLATION  10/2010  . EYE SURGERY    . HERNIA REPAIR     umbicial hernia  . JOINT REPLACEMENT Left 03/14/2019   Dr. Magnus Ivan hip  . KNEE ARTHROSCOPY Left    06/07/2017 Dr. August Saucer of [facility]  . KNEE CLOSED REDUCTION Right 12/06/2015   Procedure: CLOSED MANIPULATION RIGHT KNEE;  Surgeon: Kerrin Champagne, MD;  Location: [facility];  Service:  Orthopedics;  Laterality: Right;  . KNEE CLOSED REDUCTION Right 01/17/2016   Procedure: CLOSED MANIPULATION RIGHT KNEE;  Surgeon: Kerrin Champagne, MD;  Location: [facility] OR;  Service: Orthopedics;  Laterality: Right;  . KNEE JOINT MANIPULATION Right 12/06/2015  . LACRIMAL TUBE INSERTION Bilateral 03/01/2019   Procedure: LACRIMAL TUBE INSERTION;  Surgeon: Aura Camps, MD;  [facility];  Service: Ophthalmology;  Laterality: Bilateral;  . LACRIMAL TUBE REMOVAL Bilateral 05/03/2019   Procedure: BILATERAL NASOLACRIMAL DUCT PROBING, IIRIGATION AND TUBE REMOVAL BOTH EYES;  Surgeon: Aura Camps, MD;  Location: [facility];  Service: Ophthalmology;  Laterality: Bilateral;  . LUMBAR DISC SURGERY  06/03/2015   L 2  L3 L4 L5   .  LUMBAR LAMINECTOMY/DECOMPRESSION MICRODISCECTOMY N/A 06/03/2015   Procedure: Bilateral lateral recess decompression L2-3, L3-4, L4-5;  Surgeon: Kerrin ChampagneJames E Nitka, MD;  Location: [facility];  Service: Orthopedics;  Laterality: N/A;  . LUMBAR LAMINECTOMY/DECOMPRESSION MICRODISCECTOMY N/A 10/02/2016   Procedure: Right L5-S1 Lateral Recess Decompression  microdiscectomy;  Surgeon: Kerrin ChampagneNitka, Valeria Krisko E, MD;  [facility];  Service: Orthopedics;  Laterality: N/A;  . LUMBAR LAMINECTOMY/DECOMPRESSION MICRODISCECTOMY N/A 07/15/2018   Procedure: LEFT L3-4 MICRODISCECTOMY;  Surgeon: Kerrin ChampagneNitka, Ryland Smoots E, MD;  Location: [facility];  Service: Orthopedics;  Laterality: N/A;  . svd      x 2  . TEAR DUCT PROBING Bilateral 03/01/2019   Procedure: TEAR DUCT PROBING WITH IRRIGATION;  Surgeon: Aura CampsSpencer, Michael, MD;  [facility];  Service: Ophthalmology;  Laterality: Bilateral;  . TOTAL HIP ARTHROPLASTY Left 03/14/2019   Procedure: LEFT TOTAL HIP ARTHROPLASTY ANTERIOR APPROACH;  Surgeon: Kathryne HitchBlackman, Christopher Y, MD;  Location: [facility];  Service: Orthopedics;  Laterality: Left;  . TOTAL KNEE ARTHROPLASTY Right 09/06/2015   Procedure: RIGHT TOTAL KNEE ARTHROPLASTY;   Surgeon: Kerrin ChampagneJames E Nitka, MD;  Location: [facility];  Service: Orthopedics;  Laterality: Right;  . TUBAL LIGATION    . UPPER GASTROINTESTINAL ENDOSCOPY  04/28/11   Social History   Occupational History  . Occupation: unemployed    Associate Professormployer: UNEMPLOYED  Tobacco Use  . Smoking status: Current Some Day Smoker    Packs/day: 0.25    Years: 32.00    Pack years: 8.00    Types: Cigarettes  . Smokeless tobacco: Never Used  . Tobacco comment: DOWN TO 3 PER DAY  Vaping Use  . Vaping Use: Never used  Substance and Sexual Activity  . Alcohol use: No  . Drug use: No  . Sexual activity: Yes    Birth control/protection: Surgical, Post-menopausal    Comment: tubal ligation

## 2020-01-22 DIAGNOSIS — H2511 Age-related nuclear cataract, right eye: Secondary | ICD-10-CM | POA: Diagnosis not present

## 2020-01-22 DIAGNOSIS — H04209 Unspecified epiphora, unspecified lacrimal gland: Secondary | ICD-10-CM | POA: Diagnosis not present

## 2020-01-22 DIAGNOSIS — H538 Other visual disturbances: Secondary | ICD-10-CM | POA: Diagnosis not present

## 2020-01-22 DIAGNOSIS — E119 Type 2 diabetes mellitus without complications: Secondary | ICD-10-CM | POA: Diagnosis not present

## 2020-01-23 ENCOUNTER — Ambulatory Visit
Admission: RE | Admit: 2020-01-23 | Discharge: 2020-01-23 | Disposition: A | Discharge status: Home / Self Care | Payer: Medicaid Other | Source: Ambulatory Visit | Attending: Surgery | Admitting: Surgery

## 2020-01-23 ENCOUNTER — Other Ambulatory Visit: Payer: Self-pay

## 2020-01-23 DIAGNOSIS — S83242A Other tear of medial meniscus, current injury, left knee, initial encounter: Secondary | ICD-10-CM | POA: Diagnosis not present

## 2020-01-23 DIAGNOSIS — J3081 Allergic rhinitis due to animal (cat) (dog) hair and dander: Secondary | ICD-10-CM | POA: Diagnosis not present

## 2020-01-23 DIAGNOSIS — G8929 Other chronic pain: Secondary | ICD-10-CM | POA: Diagnosis not present

## 2020-01-23 DIAGNOSIS — J301 Allergic rhinitis due to pollen: Secondary | ICD-10-CM | POA: Diagnosis not present

## 2020-01-23 DIAGNOSIS — J3089 Other allergic rhinitis: Secondary | ICD-10-CM | POA: Diagnosis not present

## 2020-01-23 DIAGNOSIS — S8392XA Sprain of unspecified site of left knee, initial encounter: Secondary | ICD-10-CM | POA: Diagnosis not present

## 2020-01-23 DIAGNOSIS — S86812A Strain of other muscle(s) and tendon(s) at lower leg level, left leg, initial encounter: Secondary | ICD-10-CM | POA: Diagnosis not present

## 2020-01-23 DIAGNOSIS — M25562 Pain in left knee: Secondary | ICD-10-CM

## 2020-01-24 DIAGNOSIS — F331 Major depressive disorder, recurrent, moderate: Secondary | ICD-10-CM | POA: Diagnosis not present

## 2020-01-31 DIAGNOSIS — J3089 Other allergic rhinitis: Secondary | ICD-10-CM | POA: Diagnosis not present

## 2020-01-31 DIAGNOSIS — F331 Major depressive disorder, recurrent, moderate: Secondary | ICD-10-CM | POA: Diagnosis not present

## 2020-01-31 DIAGNOSIS — J3081 Allergic rhinitis due to animal (cat) (dog) hair and dander: Secondary | ICD-10-CM | POA: Diagnosis not present

## 2020-01-31 DIAGNOSIS — J301 Allergic rhinitis due to pollen: Secondary | ICD-10-CM | POA: Diagnosis not present

## 2020-02-03 DIAGNOSIS — J449 Chronic obstructive pulmonary disease, unspecified: Secondary | ICD-10-CM | POA: Diagnosis not present

## 2020-02-03 DIAGNOSIS — R269 Unspecified abnormalities of gait and mobility: Secondary | ICD-10-CM | POA: Diagnosis not present

## 2020-02-03 DIAGNOSIS — Z96642 Presence of left artificial hip joint: Secondary | ICD-10-CM | POA: Diagnosis not present

## 2020-02-03 DIAGNOSIS — G4733 Obstructive sleep apnea (adult) (pediatric): Secondary | ICD-10-CM | POA: Diagnosis not present

## 2020-02-03 DIAGNOSIS — E1140 Type 2 diabetes mellitus with diabetic neuropathy, unspecified: Secondary | ICD-10-CM | POA: Diagnosis not present

## 2020-02-03 DIAGNOSIS — M5116 Intervertebral disc disorders with radiculopathy, lumbar region: Secondary | ICD-10-CM | POA: Diagnosis not present

## 2020-02-05 DIAGNOSIS — J3081 Allergic rhinitis due to animal (cat) (dog) hair and dander: Secondary | ICD-10-CM | POA: Diagnosis not present

## 2020-02-05 DIAGNOSIS — J3089 Other allergic rhinitis: Secondary | ICD-10-CM | POA: Diagnosis not present

## 2020-02-05 DIAGNOSIS — J301 Allergic rhinitis due to pollen: Secondary | ICD-10-CM | POA: Diagnosis not present

## 2020-02-07 DIAGNOSIS — K2100 Gastro-esophageal reflux disease with esophagitis, without bleeding: Secondary | ICD-10-CM | POA: Diagnosis not present

## 2020-02-07 DIAGNOSIS — Z1211 Encounter for screening for malignant neoplasm of colon: Secondary | ICD-10-CM | POA: Diagnosis not present

## 2020-02-07 DIAGNOSIS — K5900 Constipation, unspecified: Secondary | ICD-10-CM | POA: Diagnosis not present

## 2020-02-07 DIAGNOSIS — J449 Chronic obstructive pulmonary disease, unspecified: Secondary | ICD-10-CM | POA: Diagnosis not present

## 2020-02-07 DIAGNOSIS — F331 Major depressive disorder, recurrent, moderate: Secondary | ICD-10-CM | POA: Diagnosis not present

## 2020-02-07 DIAGNOSIS — I739 Peripheral vascular disease, unspecified: Secondary | ICD-10-CM | POA: Diagnosis not present

## 2020-02-07 DIAGNOSIS — K2970 Gastritis, unspecified, without bleeding: Secondary | ICD-10-CM | POA: Diagnosis not present

## 2020-02-07 DIAGNOSIS — K3184 Gastroparesis: Secondary | ICD-10-CM | POA: Diagnosis not present

## 2020-02-12 ENCOUNTER — Telehealth: Payer: Self-pay | Admitting: Specialist

## 2020-02-12 DIAGNOSIS — J3089 Other allergic rhinitis: Secondary | ICD-10-CM | POA: Diagnosis not present

## 2020-02-12 DIAGNOSIS — J301 Allergic rhinitis due to pollen: Secondary | ICD-10-CM | POA: Diagnosis not present

## 2020-02-12 DIAGNOSIS — J3081 Allergic rhinitis due to animal (cat) (dog) hair and dander: Secondary | ICD-10-CM | POA: Diagnosis not present

## 2020-02-12 NOTE — Telephone Encounter (Signed)
I called and advised that she has an appt on Friday to review her MRI and that she needs to make sure she is using the walker to get around.

## 2020-02-12 NOTE — Telephone Encounter (Signed)
Pt wanted to make Dr.Nitka aware that her leg has given out multiple times since she went for her MRI and she would like a CB to discuss some options she may have.  [phone]

## 2020-02-13 DIAGNOSIS — J3081 Allergic rhinitis due to animal (cat) (dog) hair and dander: Secondary | ICD-10-CM | POA: Diagnosis not present

## 2020-02-13 DIAGNOSIS — J301 Allergic rhinitis due to pollen: Secondary | ICD-10-CM | POA: Diagnosis not present

## 2020-02-13 DIAGNOSIS — J449 Chronic obstructive pulmonary disease, unspecified: Secondary | ICD-10-CM | POA: Diagnosis not present

## 2020-02-13 DIAGNOSIS — J3089 Other allergic rhinitis: Secondary | ICD-10-CM | POA: Diagnosis not present

## 2020-02-14 DIAGNOSIS — F331 Major depressive disorder, recurrent, moderate: Secondary | ICD-10-CM | POA: Diagnosis not present

## 2020-02-15 ENCOUNTER — Encounter: Payer: Medicaid Other | Attending: Physical Medicine & Rehabilitation | Admitting: Physical Medicine & Rehabilitation

## 2020-02-15 ENCOUNTER — Other Ambulatory Visit: Payer: Self-pay

## 2020-02-15 ENCOUNTER — Encounter: Payer: Self-pay | Admitting: Physical Medicine & Rehabilitation

## 2020-02-15 ENCOUNTER — Ambulatory Visit: Payer: Medicaid Other | Admitting: Specialist

## 2020-02-15 VITALS — BP 132/72 | HR 83 | Temp 98.3°F | Ht 66.0 in | Wt 182.6 lb

## 2020-02-15 DIAGNOSIS — R202 Paresthesia of skin: Secondary | ICD-10-CM | POA: Diagnosis not present

## 2020-02-15 DIAGNOSIS — M25562 Pain in left knee: Secondary | ICD-10-CM | POA: Insufficient documentation

## 2020-02-15 DIAGNOSIS — M961 Postlaminectomy syndrome, not elsewhere classified: Secondary | ICD-10-CM | POA: Diagnosis not present

## 2020-02-15 DIAGNOSIS — M25561 Pain in right knee: Secondary | ICD-10-CM | POA: Diagnosis not present

## 2020-02-15 DIAGNOSIS — Z720 Tobacco use: Secondary | ICD-10-CM | POA: Insufficient documentation

## 2020-02-15 DIAGNOSIS — G8929 Other chronic pain: Secondary | ICD-10-CM | POA: Insufficient documentation

## 2020-02-15 DIAGNOSIS — Z96651 Presence of right artificial knee joint: Secondary | ICD-10-CM | POA: Insufficient documentation

## 2020-02-15 DIAGNOSIS — R269 Unspecified abnormalities of gait and mobility: Secondary | ICD-10-CM | POA: Insufficient documentation

## 2020-02-15 DIAGNOSIS — G894 Chronic pain syndrome: Secondary | ICD-10-CM | POA: Insufficient documentation

## 2020-02-15 MED ORDER — DULOXETINE HCL 60 MG PO CPEP: 60.0000 mg | ORAL_CAPSULE | Freq: Every day | ORAL | 3 refills | Status: DC

## 2020-02-15 NOTE — Progress Notes (Signed)
Subjective:    Patient ID: Stacy [name], female    DOB: 1961/01/20, 60 y.o.   MRN: [id number]  HPI Female with pmh/psh RLS, neuropathy, HTN, headache, GERD, DM, COPD, left THA, lumbar spinal stenosis with claudication s/p lumbar surgery x3 (most recent 07/15/2018), DJD, PAD right TKA in 2017 followed up by manipulation x2, left THA in 03/2019 presents LE pain. Initially stated: History taken from chart review and patient. Started ~2018.  States she had a knee pain with buckling.  Denies inciting event.  Getting improve.  Ice/heat improves the pain.  Prolonged standing exacerbates the pain.  Non-radiating. Sharp.  Intermittent.  Denies associated numbness.  Denies falls. Pain limits prolonged standing and ambulation.   Last clinic visit 11/16/19.  Since that time, pt states she has been having left knee pain and Ortho performed MRI, which she has an appointment tomorrow to follow up in regard to. She restarted and completed therapies.  She is TENS. She continues to take meds as prescribed. She has had falls when her left knee gives out. She is using rollator.  She is not sure what the plan for her back is, she is following up with surgery. She is smoking 5 cig/day.  Pain Inventory Average Pain 7 Pain Right Now 7 My pain is constant, sharp and aching  In the last 24 hours, has pain interfered with the following? General activity 5 Relation with others 0 Enjoyment of life 10 What TIME of day is your pain at its worst? morning, evening, night Sleep (in general) Poor  Pain is worse with: bending, sitting and standing Pain improves with: heat/ice, medication and TENS Relief from Meds: 5    Family History  Problem Relation Age of Onset  . Hypertension Father   . Cancer Father   . Heart disease Mother   . Asthma Son        had as a child  . Heart disease Sister   . Breast cancer Sister   . Hypertension Brother    Social History   Socioeconomic History  . Marital status:  Married    Spouse name: Not on file  . Number of children: 2  . Years of education: Not on file  . Highest education level: Not on file  Occupational History  . Occupation: unemployed    Associate Professor: UNEMPLOYED  Tobacco Use  . Smoking status: Current Some Day Smoker    Packs/day: 0.25    Years: 32.00    Pack years: 8.00    Types: Cigarettes  . Smokeless tobacco: Never Used  . Tobacco comment: DOWN TO 3 PER DAY  Vaping Use  . Vaping Use: Never used  Substance and Sexual Activity  . Alcohol use: No  . Drug use: No  . Sexual activity: Yes    Birth control/protection: Surgical, Post-menopausal    Comment: tubal ligation  Other Topics Concern  . Not on file  Social History Narrative  . Not on file   Social Determinants of Health   Financial Resource Strain: Not on file  Food Insecurity: Not on file  Transportation Needs: Not on file  Physical Activity: Not on file  Stress: Not on file  Social Connections: Not on file   Past Surgical History:  Procedure Laterality Date  . BACK SURGERY    . CHOLECYSTECTOMY    . COLONOSCOPY    . ENDOMETRIAL ABLATION  10/2010  . EYE SURGERY    . HERNIA REPAIR     umbicial hernia  .  JOINT REPLACEMENT Left 03/14/2019   Dr. Magnus Ivan hip  . KNEE ARTHROSCOPY Left    06/07/2017 Dr. August Saucer of [facility]  . KNEE CLOSED REDUCTION Right 12/06/2015   Procedure: CLOSED MANIPULATION RIGHT KNEE;  Surgeon: Kerrin Champagne, MD;  Location: [facility];  Service: Orthopedics;  Laterality: Right;  . KNEE CLOSED REDUCTION Right 01/17/2016   Procedure: CLOSED MANIPULATION RIGHT KNEE;  Surgeon: Kerrin Champagne, MD;  Location: [facility];  Service: Orthopedics;  Laterality: Right;  . KNEE JOINT MANIPULATION Right 12/06/2015  . LACRIMAL TUBE INSERTION Bilateral 03/01/2019   Procedure: LACRIMAL TUBE INSERTION;  Surgeon: Aura Camps, MD;  Location[facility];  Service: Ophthalmology;  Laterality: Bilateral;  . LACRIMAL TUBE REMOVAL Bilateral 05/03/2019    Procedure: BILATERAL NASOLACRIMAL DUCT PROBING, IIRIGATION AND TUBE REMOVAL BOTH EYES;  Surgeon: Aura Camps, MD;  Location: [facility];  Service: Ophthalmology;  Laterality: Bilateral;  . LUMBAR DISC SURGERY  06/03/2015   L 2  L3 L4 L5   . LUMBAR LAMINECTOMY/DECOMPRESSION MICRODISCECTOMY N/A 06/03/2015   Procedure: Bilateral lateral recess decompression L2-3, L3-4, L4-5;  Surgeon: Kerrin Champagne, MD;  [facility];  Service: Orthopedics;  Laterality: N/A;  . LUMBAR LAMINECTOMY/DECOMPRESSION MICRODISCECTOMY N/A 10/02/2016   Procedure: Right L5-S1 Lateral Recess Decompression  microdiscectomy;  Surgeon: Kerrin Champagne, MD;  Location: [facility];  Service: Orthopedics;  Laterality: N/A;  . LUMBAR LAMINECTOMY/DECOMPRESSION MICRODISCECTOMY N/A 07/15/2018   Procedure: LEFT L3-4 MICRODISCECTOMY;  Surgeon: Kerrin Champagne, MD;  [facility];  Service: Orthopedics;  Laterality: N/A;  . svd      x 2  . TEAR DUCT PROBING Bilateral 03/01/2019   Procedure: TEAR DUCT PROBING WITH IRRIGATION;  Surgeon: Aura Camps, MD;  Location: [facility];  Service: Ophthalmology;  Laterality: Bilateral;  . TOTAL HIP ARTHROPLASTY Left 03/14/2019   Procedure: LEFT TOTAL HIP ARTHROPLASTY ANTERIOR APPROACH;  Surgeon: [name], MD;  [facility]: [facility];  Service: Orthopedics;  Laterality: Left;  . TOTAL KNEE ARTHROPLASTY Right 09/06/2015   Procedure: RIGHT TOTAL KNEE ARTHROPLASTY;  Surgeon: Kerrin Champagne, MD;  [facility];  Service: Orthopedics;  Laterality: Right;  . TUBAL LIGATION    . UPPER GASTROINTESTINAL ENDOSCOPY  04/28/11   Past Medical History:  Diagnosis Date  . Allergy    Shellfish, cleaning products  . Anxiety   . Arthritis   . 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 Asthma   . COPD (chronic obstructive pulmonary disease) (HCC)   . Depression   . Diabetes mellitus    Type II  . GERD (gastroesophageal reflux disease)    Pt on Protonix daily   . Glaucoma   . Gout   . Headache(784.0)    otc meds prn  . Hyperlipidemia   . Hypertension   . Irritable bowel syndrome 11/19/2010  . Neuropathy   . Pneumonia YRS AGO  . Restless legs   . Shortness of breath    07/14/2018- uses  4 times a day   BP 132/72   Pulse 83   Temp 98.3 F (36.8 C)   Ht 5\' 6"  (1.676 m)   Wt 182 lb 9.6 oz (82.8 kg)   LMP 09/01/2010   SpO2 95%   BMI 29.47 kg/m   Opioid Risk Score:   Fall Risk Score:  `1  Depression screen [facility] 2/9  Depression screen [facility] 2/9 02/15/2020 11/16/2019 07/24/2019 10/14/2018 08/11/2018 08/09/2018 06/06/2018  Decreased Interest 1  1 0 0 0 0 0  Down, Depressed, Hopeless 1 1 0 0 0 0 0  PHQ - 2 Score 2 2 0 0 0 0 0  Altered sleeping - - - - - 0 0  Tired, decreased energy - - - - - 0 0  Change in appetite - - - - - 0 0  Feeling bad or failure about yourself  - - - - - 0 0  Trouble concentrating - - - - - 0 0  Moving slowly or fidgety/restless - - - - - 0 0  Suicidal thoughts - - - - - 0 -  PHQ-9 Score - - - - - 0 0  Difficult doing work/chores - - - - - Not difficult at all -  Some recent data might be hidden    Review of Systems  Constitutional: Positive for diaphoresis.  HENT: Negative.   Eyes: Negative.   Respiratory: Positive for wheezing.        Has cpap now  Cardiovascular: Negative.   Gastrointestinal: Negative.   Endocrine:       High/low blood sugar  Genitourinary: Negative.   Musculoskeletal: Positive for arthralgias, back pain, gait problem, joint swelling and myalgias.       Spasms   Skin: Negative.   Allergic/Immunologic: Negative.   Neurological:       Tremors are better now/ tingling sometimes  Hematological: Bruises/bleeds easily.       Plavix  Psychiatric/Behavioral: The patient is nervous/anxious.   All other systems reviewed and are negative.     Objective:   Physical Exam  Constitutional: No distress . Vital signs reviewed. HENT: Normocephalic.  Atraumatic. Eyes: EOMI. No  discharge. Cardiovascular: No JVD.   Respiratory: Normal effort.  No stridor.   GI: Non-distended.   Skin: Warm and dry.  Intact. Psych: Normal mood.  Normal behavior. Musc: No edema in extremities.  No tenderness in extremities. Musc: Gait antalgic             No right knee edema or TTP  Left knee very TTP, generalized Neuro: Strength         5/5 in all LE myotomes     Assessment & Plan:  Female with pmh/psh RLS, neuropathy, HTN, headache, GERD, DM, COPD, left THA, lumbar spinal stenosis with claudication s/p lumbar surgery x3 (most recent 07/15/2018), DJD, PAD right TKA in 2017 followed up by manipulation x2, left THA in 03/2019 presents with LE pain.  1. Chronic right knee pain s/p TKA with manipulation x2             MRI from 09/2017 showing postop changes             Cont Heat/Cold             Completed outpatient PT after THA             Continue  E-stim             Will consider bracing if necessary             Continue OTC Lidoderm patch              Continue Cymbalta             Cont Robaxin per Ortho spine             D/c Mobic 15mg  with food while on Voltaren gel (patient prefers), labs on 08/02/19 within acceptable range, Celebrex, Volaren d/ced - educated patient to avoid  duplicate therapy with NSAIDs             Patient states main goal is to be more active, was improving until left knee issues  Improved  2. Gait abnormality             Left THA on 03/2019             See #1  Continue rollator   Cont HEP  3. LLE pain             Continue Gabapentin to 600 TID - has been on years             See #1  4. Left hip pain now s/p left THA             See #2  5. Low back pain                      Continue follow up with Ortho spine, unsure of plan as of yet  6. Tobacco abuse             Continues to smoke 5 cig/day             Educated/Encouraged cessation again  7. Tremors              See #1  Improved  8. Left knee pain  Following up with Ortho, MRI  recently performed,results tomorrow  Taking Robaxin per Ortho

## 2020-02-16 ENCOUNTER — Ambulatory Visit (INDEPENDENT_AMBULATORY_CARE_PROVIDER_SITE_OTHER): Payer: Medicaid Other | Admitting: Specialist

## 2020-02-16 ENCOUNTER — Telehealth: Payer: Self-pay

## 2020-02-16 ENCOUNTER — Encounter: Payer: Self-pay | Admitting: Specialist

## 2020-02-16 VITALS — BP 161/80 | HR 108 | Ht 66.0 in | Wt 182.7 lb

## 2020-02-16 DIAGNOSIS — M25562 Pain in left knee: Secondary | ICD-10-CM | POA: Diagnosis not present

## 2020-02-16 DIAGNOSIS — M23332 Other meniscus derangements, other medial meniscus, left knee: Secondary | ICD-10-CM | POA: Diagnosis not present

## 2020-02-16 DIAGNOSIS — M533 Sacrococcygeal disorders, not elsewhere classified: Secondary | ICD-10-CM | POA: Diagnosis not present

## 2020-02-16 DIAGNOSIS — S83242D Other tear of medial meniscus, current injury, left knee, subsequent encounter: Secondary | ICD-10-CM

## 2020-02-16 DIAGNOSIS — M23322 Other meniscus derangements, posterior horn of medial meniscus, left knee: Secondary | ICD-10-CM

## 2020-02-16 DIAGNOSIS — H5213 Myopia, bilateral: Secondary | ICD-10-CM | POA: Diagnosis not present

## 2020-02-16 DIAGNOSIS — Z96642 Presence of left artificial hip joint: Secondary | ICD-10-CM | POA: Diagnosis not present

## 2020-02-16 DIAGNOSIS — G8929 Other chronic pain: Secondary | ICD-10-CM

## 2020-02-16 NOTE — Telephone Encounter (Signed)
I am fine with him filling out the surgery sheet and giving it to Ms. Billings.

## 2020-02-16 NOTE — Progress Notes (Signed)
Office Visit Note   Patient: Stacy [name]           Date of Birth: 01/26/1961           MRN: [id number] Visit Date: 02/16/2020              Requested by: Marcine Matar, MD [address] [place],  [place] [address] PCP: Marcine Matar, MD   Assessment & Plan: Visit Diagnoses:  1. Chronic SI joint pain   2. Other meniscus derangements, posterior horn of medial meniscus, left knee   3. Mechanical knee pain, left     Plan: Knee is suffering from osteoarthritis, only real proven treatments are Weight loss, NSIADs like motrin or transdermal voltaren gel and exercise. Well padded shoes help. Ice the knee ice is suffering from osteoarthritis, only real proven treatments are Referral to Dr. Magnus Ivan for left knee arthroscopy for torn meniscus medial posterior horn. Ice the knee 2-3 times a day 15-20 mins at a time.-3 times a day 15-20 mins at a time. Hot showers in the AM.  Injection with steroid may be of benefit. Hemp CBD capsules, amazon.com 5,000-7,000 mg per bottle, 60 capsules per bottle, take one capsule twice a day. Cane in the left hand to use with left leg weight bearing. Follow-Up Instructions: No follow-ups on file.   Follow-Up Instructions: Return in about 1 week (around 02/23/2020) for with dr Magnus Ivan to discuss scheduling of left knee scope/MRI review..   Orders:  No orders of the defined types were placed in this encounter.  No orders of the defined types were placed in this encounter.     Procedures: No procedures performed   Clinical Data: Findings:  CLINICAL DATA:  Chronic knee pain  EXAM: MRI OF THE LEFT KNEE WITHOUT CONTRAST  TECHNIQUE: Multiplanar, multisequence MR imaging of the knee was performed. No intravenous contrast was administered.  COMPARISON:  None.  FINDINGS: MENISCI  Medial: There is a nondisplaced horizontal longitudinal tear seen of the posterior horn of the medial meniscus extending to the mid  body.  Lateral: Intact.  LIGAMENTS  Cruciates: The ACL is intact. The PCL is intact.  Collaterals: The MCL is intact. The lateral collateral ligamentous complex is intact.  CARTILAGE  Patellofemoral: Mild chondral fissuring seen within the medial patellar facet with underlying subchondral marrow signal change.  Medial compartment: Mild chondral thinning seen the weight-bearing surface of the medial femoral condyle. Small subchondral cystic changes seen within the lateral tibial plateau.  Lateral compartment: Mild chondral thinning seen the weight-bearing surface of the lateral femoral condyle.  BONES: No fracture. No avascular necrosis. No pathologic marrow infiltration.  JOINT: Small joint effusion. Edema within the superolateral corner of Hoffa's fat pad. No plical thickening.  EXTENSOR MECHANISM: The patellar and quadriceps tendon are intact. Mildly increased signal seen within the medial patellar retinaculum insertion site.  POPLITEAL FOSSA: No popliteal cyst.  OTHER:  The visualized muscles are normal in appearance.  IMPRESSION: Nondisplaced tear of the posterior medial meniscus.  Intact cruciate ligaments  Mild tricompartmental chondral disease  Intrasubstance sprain of the medial patellar retinaculum at the insertion site.   Electronically Signed   By: Jonna Clark M.D.   On: 01/23/2020 13:44    Subjective: Chief Complaint  Patient presents with  . Left Knee - Follow-up    MRI Review Left knee, says it is giving out on her several times    60 year old female with a 3 month history of increasing  left knee pain. The left knee buckled one day and has had recurring swelling and medial joint line pain, popping and catching. The left knee tends to give away on her. No numbness or tingling. Pain with squatting and kneeling and with standing and walking. Spending more time sedentary and in bed due to worsening pain. MRI with Nondisplaced  horizontal Tear left knee medial meniscus. Right TKR with no swelling had 2 knee maniputations post TKR with good ROM at this point.     Review of Systems  Constitutional: Negative.   HENT: Negative.   Eyes: Negative.   Respiratory: Negative.   Cardiovascular: Negative.   Gastrointestinal: Negative.   Endocrine: Negative.   Genitourinary: Negative.   Musculoskeletal: Negative.   Skin: Negative.   Allergic/Immunologic: Negative.   Neurological: Negative.   Hematological: Negative.   Psychiatric/Behavioral: Negative.      Objective: Vital Signs: BP (!) 161/80 (BP Location: Left Arm, Patient Position: Sitting)   Pulse (!) 108   Ht 5\' 6"  (1.676 m)   Wt 182 lb 11.2 oz (82.9 kg)   LMP 09/01/2010   BMI 29.49 kg/m   Physical Exam Constitutional:      Appearance: She is well-developed and well-nourished.  HENT:     Head: Normocephalic and atraumatic.  Eyes:     Extraocular Movements: EOM normal.     Pupils: Pupils are equal, round, and reactive to light.  Pulmonary:     Effort: Pulmonary effort is normal.     Breath sounds: Normal breath sounds.  Abdominal:     General: Bowel sounds are normal.     Palpations: Abdomen is soft.  Musculoskeletal:     Cervical back: Normal range of motion and neck supple.     Left knee:     Instability Tests: Medial McMurray test positive.  Skin:    General: Skin is warm and dry.  Neurological:     Mental Status: She is alert and oriented to person, place, and time.  Psychiatric:        Mood and Affect: Mood and affect normal.        Behavior: Behavior normal.        Thought Content: Thought content normal.        Judgment: Judgment normal.     Left Knee Exam   Muscle Strength  The patient has normal left knee strength.  Tenderness  The patient is experiencing tenderness in the medial joint line and medial retinaculum.  Range of Motion  Extension:  -5 abnormal  Flexion: 130   Tests  McMurray:  Medial - positive   Lachman:  Anterior - negative    Posterior - negative Drawer:  Anterior - negative     Posterior - negative Pivot shift: 1+  Other  Erythema: absent Scars: absent Swelling: mild      Specialty Comments:  No specialty comments available.  Imaging: No results found.   PMFS History: Patient Active Problem List   Diagnosis Date Noted  . Herniation of lumbar intervertebral disc with radiculopathy 10/02/2016    Priority: High    Class: Chronic  . Chondromalacia of both patellae 06/03/2015    Priority: High    Class: Chronic  . Chronic pain of left knee 02/15/2020  . Paresthesia of skin 11/16/2019  . History of total knee replacement, right 11/16/2019  . Tobacco abuse 11/16/2019  . Centrilobular emphysema (HCC) 08/10/2019  . Incidental lung nodule, > 13mm and < 74mm 08/10/2019  . OSA on CPAP 08/10/2019  .  Dyspnea on exertion 08/02/2019  . Hyperlipidemia 08/02/2019  . Lumbar radiculopathy 04/24/2019  . 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  . Post laminectomy syndrome 02/23/2019  . Abnormality of gait 02/23/2019  . 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 Unilateral primary osteoarthritis, left hip 12/28/2018  . Lesion of skin of left ear 12/26/2018  . Primary osteoarthritis of left hip 12/02/2018  . Iron deficiency anemia 10/16/2018  . Chronic pain syndrome 09/08/2018  . Chronic pain of right knee 08/11/2018  . Status post lumbar laminectomy 07/15/2018  . Peripheral arterial disease (HCC) 04/05/2018  . Moderate persistent asthma without complication 06/29/2017  . Environmental and seasonal allergies 06/29/2017  . Controlled type 2 diabetes mellitus with diabetic polyneuropathy, without long-term current use of insulin (HCC) 06/29/2017  . Perennial allergic rhinitis 04/08/2017  . Sensorineural hearing loss (SNHL), bilateral 04/08/2017  . Chronic pansinusitis 03/25/2017  . Eustachian tube dysfunction, bilateral 03/25/2017  . 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  Alopecia areata 08/19/2016  . Spinal stenosis, lumbar region, with neurogenic claudication 06/03/2015  . Tobacco use disorder 04/25/2015  . DJD (degenerative joint disease) of knee 01/04/2015  . Hemorrhoid 11/14/2014  . Gout of big toe 07/19/2014  . Essential hypertension 08/14/2013  . Gastroesophageal reflux disease without esophagitis 08/14/2013  . COPD (chronic obstructive pulmonary disease) (HCC) 04/17/2011   Past Medical History:  Diagnosis Date  . Allergy    Shellfish, cleaning products  . Anxiety   . Arthritis   . 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 Asthma   . COPD (chronic obstructive pulmonary disease) (HCC)   . Depression   . Diabetes mellitus    Type II  . GERD (gastroesophageal reflux disease)    Pt on Protonix daily  . Glaucoma   . Gout   . Headache(784.0)    otc meds prn  . Hyperlipidemia   . Hypertension   . Irritable bowel syndrome 11/19/2010  . Neuropathy   . Pneumonia YRS AGO  . Restless legs   . Shortness of breath    07/14/2018- uses  4 times a day    Family History  Problem Relation Age of Onset  . Hypertension Father   . Cancer Father   . Heart disease Mother   . Asthma Son        had as a child  . Heart disease Sister   . Breast cancer Sister   . Hypertension Brother     Past Surgical History:  Procedure Laterality Date  . BACK SURGERY    . CHOLECYSTECTOMY    . COLONOSCOPY    . ENDOMETRIAL ABLATION  10/2010  . EYE SURGERY    . HERNIA REPAIR     umbicial hernia  . JOINT REPLACEMENT Left 03/14/2019   Dr. Magnus Ivan hip  . KNEE ARTHROSCOPY Left    06/07/2017 Dr. August Saucer of [facility]  . KNEE CLOSED REDUCTION Right 12/06/2015   Procedure: CLOSED MANIPULATION RIGHT KNEE;  Surgeon: Kerrin Champagne, MD;  Location: [facility];  Service: Orthopedics;  Laterality: Right;  . KNEE CLOSED REDUCTION Right 01/17/2016   Procedure: CLOSED MANIPULATION RIGHT KNEE;  Surgeon: Kerrin Champagne, MD;  Location: [facility];  Service: Orthopedics;   Laterality: Right;  . KNEE JOINT MANIPULATION Right 12/06/2015  . LACRIMAL TUBE INSERTION Bilateral 03/01/2019   Procedure: LACRIMAL TUBE INSERTION;  Surgeon: Aura Camps, MD;  Location[facility];  Service: Ophthalmology;  Laterality: Bilateral;  . LACRIMAL TUBE REMOVAL Bilateral  05/03/2019   Procedure: BILATERAL NASOLACRIMAL DUCT PROBING, IIRIGATION AND TUBE REMOVAL BOTH EYES;  Surgeon: Aura CampsSpencer, Michael, MD;  Location: [facility];  Service: Ophthalmology;  Laterality: Bilateral;  . LUMBAR DISC SURGERY  06/03/2015   L 2  L3 L4 L5   . LUMBAR LAMINECTOMY/DECOMPRESSION MICRODISCECTOMY N/A 06/03/2015   Procedure: Bilateral lateral recess decompression L2-3, L3-4, L4-5;  Surgeon: Kerrin ChampagneJames E Larron Armor, MD;  Location: [facility];  Service: Orthopedics;  Laterality: N/A;  . LUMBAR LAMINECTOMY/DECOMPRESSION MICRODISCECTOMY N/A 10/02/2016   Procedure: Right L5-S1 Lateral Recess Decompression  microdiscectomy;  Surgeon: Kerrin ChampagneNitka, Deontay Ladnier E, MD;  [facility];  Service: Orthopedics;  Laterality: N/A;  . LUMBAR LAMINECTOMY/DECOMPRESSION MICRODISCECTOMY N/A 07/15/2018   Procedure: LEFT L3-4 MICRODISCECTOMY;  Surgeon: Kerrin ChampagneNitka, Janette Harvie E, MD;  [facility]: [facility];  Service: Orthopedics;  Laterality: N/A;  . svd      x 2  . TEAR DUCT PROBING Bilateral 03/01/2019   Procedure: TEAR DUCT PROBING WITH IRRIGATION;  Surgeon: Aura CampsSpencer, Michael, MD;  [facility];  Service: Ophthalmology;  Laterality: Bilateral;  . TOTAL HIP ARTHROPLASTY Left 03/14/2019   Procedure: LEFT TOTAL HIP ARTHROPLASTY ANTERIOR APPROACH;  Surgeon: Kathryne HitchBlackman, Christopher Y, MD;  Location: [facility];  Service: Orthopedics;  Laterality: Left;  . TOTAL KNEE ARTHROPLASTY Right 09/06/2015   Procedure: RIGHT TOTAL KNEE ARTHROPLASTY;  Surgeon: Kerrin ChampagneJames E Jonhatan Hearty, MD;  Location: [facility];  Service: Orthopedics;  Laterality: Right;  . TUBAL LIGATION    . UPPER GASTROINTESTINAL ENDOSCOPY  04/28/11   Social History   Occupational History   . Occupation: unemployed    Associate Professormployer: UNEMPLOYED  Tobacco Use  . Smoking status: Current Some Day Smoker    Packs/day: 0.25    Years: 32.00    Pack years: 8.00    Types: Cigarettes  . Smokeless tobacco: Never Used  . Tobacco comment: DOWN TO 3 PER DAY  Vaping Use  . Vaping Use: Never used  Substance and Sexual Activity  . Alcohol use: No  . Drug use: No  . Sexual activity: Yes    Birth control/protection: Surgical, Post-menopausal    Comment: tubal ligation

## 2020-02-16 NOTE — Telephone Encounter (Signed)
Fayrene Fearing saw patient today, she has a meniscus tear; he spoke with her. Can he fill out a surgery sheet or would you want to see her first?

## 2020-02-16 NOTE — Patient Instructions (Signed)
Plan: Knee is suffering from osteoarthritis, only real proven treatments are Weight loss, NSIADs like motrin or transdermal voltaren gel and exercise. Well padded shoes help. Ice the knee ice is suffering from osteoarthritis, only real proven treatments are Referral to Dr. Magnus Ivan for left knee arthroscopy for torn meniscus medial posterior horn. Ice the knee 2-3 times a day 15-20 mins at a time.-3 times a day 15-20 mins at a time. Hot showers in the AM.  Injection with steroid may be of benefit. Hemp CBD capsules, amazon.com 5,000-7,000 mg per bottle, 60 capsules per bottle, take one capsule twice a day. Cane in the left hand to use with left leg weight bearing. Follow-Up Instructions: No follow-ups on file.

## 2020-02-16 NOTE — Progress Notes (Signed)
Office Visit Note   Patient: Stacy [name]           Date of Birth: Jul 08, 1960           MRN: [id number] Visit Date: 02/16/2020              Requested by: Marcine MatarJohnson, Deborah B, MD [address] [place],  [place] [id number] PCP: Marcine MatarJohnson, Deborah B, MD   Assessment & Plan: Visit Diagnoses:  1. Chronic SI joint pain   2. Other meniscus derangements, posterior horn of medial meniscus, left knee   3. Mechanical knee pain, left     Plan: I reviewed MRI left knee findings with patient today.  I think she may do well with outpatient knee arthroscopy with debridement.  We will refer her to Dr. Magnus IvanBlackman since he has done a hip replacement on her in the past.  Preop sheet filled out.  We will have her see Dr. Magnus IvanBlackman in 1 week for him to review the MRI and discuss surgical intervention.  In regards to her bilateral SI joint pain.  She did get very good diagnostic relief for 6 days.  I did discuss with her neck step would be to refer her to Dr. Alvester MorinNewton physiatrist here in our office to do fluoroscopy guided bilateral SI joint Marcaine/steroid injections and she may be a good candidate radiofrequency ablation.  All questions answered.  Dr. Otelia SergeantNitka put patient in a knee immobilizer since she was complaining of her knee buckling.  Follow-Up Instructions: Return in about 1 week (around 02/23/2020) for with dr Magnus Ivanblackman to discuss scheduling of left knee scope/MRI review..   Orders:  No orders of the defined types were placed in this encounter.  No orders of the defined types were placed in this encounter.     Procedures: No procedures performed   Clinical Data: No additional findings.   Subjective: Chief Complaint  Patient presents with  . Left Knee - Follow-up    MRI Review Left knee, says it is giving out on her several times    HPI 60 year old black female history of left knee pain and mechanical symptoms and bilateral SI joint pain returns for recheck and review of her left knee  MRI.  Patient states that she continues have ongoing mechanical symptoms of her knee and feels like it is giving out on her.  MRI scan that was done January 23, 2020 showed:  EXAM: MRI OF THE LEFT KNEE WITHOUT CONTRAST  TECHNIQUE: Multiplanar, multisequence MR imaging of the knee was performed. No intravenous contrast was administered.  COMPARISON:  None.  FINDINGS: MENISCI  Medial: There is a nondisplaced horizontal longitudinal tear seen of the posterior horn of the medial meniscus extending to the mid body.  Lateral: Intact.  LIGAMENTS  Cruciates: The ACL is intact. The PCL is intact.  Collaterals: The MCL is intact. The lateral collateral ligamentous complex is intact.  CARTILAGE  Patellofemoral: Mild chondral fissuring seen within the medial patellar facet with underlying subchondral marrow signal change.  Medial compartment: Mild chondral thinning seen the weight-bearing surface of the medial femoral condyle. Small subchondral cystic changes seen within the lateral tibial plateau.  Lateral compartment: Mild chondral thinning seen the weight-bearing surface of the lateral femoral condyle.  BONES: No fracture. No avascular necrosis. No pathologic marrow infiltration.  JOINT: Small joint effusion. Edema within the superolateral corner of Hoffa's fat pad. No plical thickening.  EXTENSOR MECHANISM: The patellar and quadriceps tendon are intact. Mildly increased signal seen within  the medial patellar retinaculum insertion site.  POPLITEAL FOSSA: No popliteal cyst.  OTHER:  The visualized muscles are normal in appearance.  IMPRESSION: Nondisplaced tear of the posterior medial meniscus.  Intact cruciate ligaments  Mild tricompartmental chondral disease  Intrasubstance sprain of the medial patellar retinaculum at the insertion site.   Electronically Signed   By: Jonna Clark M.D.   On: 01/23/2020 13:44   My last office visit  with patient January 18, 2020 she also had bilateral SI joint pain.  I asked Dr. Lavada Mesi here in our office to perform ultrasound-guided Marcaine/steroid injection.  Patient states that she had about 80 to 90% improvement of her pain for 6 days and she was very pleased.    Review of Systems No current cardiac pulmonary GI GU issues  Objective: Vital Signs: BP (!) 161/80 (BP Location: Left Arm, Patient Position: Sitting)   Pulse (!) 108   Ht 5\' 6"  (1.676 m)   Wt 182 lb 11.2 oz (82.9 kg)   LMP 09/01/2010   BMI 29.49 kg/m   Physical Exam HENT:     Head: Normocephalic.  Eyes:     Extraocular Movements: Extraocular movements intact.     Pupils: Pupils are equal, round, and reactive to light.  Pulmonary:     Effort: No respiratory distress.  Musculoskeletal:     Comments: Exam left knee.  Medial greater than lateral joint line tenderness.  Positive McMurray's test.  Ligaments are stable.  Neurological:     Mental Status: She is alert and oriented to person, place, and time.  Psychiatric:        Mood and Affect: Mood normal.     Ortho Exam  Specialty Comments:  No specialty comments available.  Imaging: No results found.   PMFS History: Patient Active Problem List   Diagnosis Date Noted  . Chronic pain of left knee 02/15/2020  . Paresthesia of skin 11/16/2019  . History of total knee replacement, right 11/16/2019  . Tobacco abuse 11/16/2019  . Centrilobular emphysema (HCC) 08/10/2019  . Incidental lung nodule, > 34mm and < 48mm 08/10/2019  . OSA on CPAP 08/10/2019  . Dyspnea on exertion 08/02/2019  . Hyperlipidemia 08/02/2019  . Lumbar radiculopathy 04/24/2019  . 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  . Post laminectomy syndrome 02/23/2019  . Abnormality of gait 02/23/2019  . 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 Unilateral primary osteoarthritis, left hip 12/28/2018  . Lesion of skin of left ear 12/26/2018  . Primary osteoarthritis of left hip  12/02/2018  . Iron deficiency anemia 10/16/2018  . Chronic pain syndrome 09/08/2018  . Chronic pain of right knee 08/11/2018  . Status post lumbar laminectomy 07/15/2018  . Peripheral arterial disease (HCC) 04/05/2018  . Moderate persistent asthma without complication 06/29/2017  . Environmental and seasonal allergies 06/29/2017  . Controlled type 2 diabetes mellitus with diabetic polyneuropathy, without long-term current use of insulin (HCC) 06/29/2017  . Perennial allergic rhinitis 04/08/2017  . Sensorineural hearing loss (SNHL), bilateral 04/08/2017  . Chronic pansinusitis 03/25/2017  . Eustachian tube dysfunction, bilateral 03/25/2017  . 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 Herniation of lumbar intervertebral disc with radiculopathy 10/02/2016    Class: Chronic  . Alopecia areata 08/19/2016  . Chondromalacia of both patellae 06/03/2015    Class: Chronic  . Spinal stenosis, lumbar region, with neurogenic claudication 06/03/2015  . Tobacco use disorder 04/25/2015  . DJD (degenerative joint disease) of knee 01/04/2015  . Hemorrhoid 11/14/2014  . Gout of big  toe 07/19/2014  . Essential hypertension 08/14/2013  . Gastroesophageal reflux disease without esophagitis 08/14/2013  . COPD (chronic obstructive pulmonary disease) (HCC) 04/17/2011   Past Medical History:  Diagnosis Date  . Allergy    Shellfish, cleaning products  . Anxiety   . Arthritis   . 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 Asthma   . COPD (chronic obstructive pulmonary disease) (HCC)   . Depression   . Diabetes mellitus    Type II  . GERD (gastroesophageal reflux disease)    Pt on Protonix daily  . Glaucoma   . Gout   . Headache(784.0)    otc meds prn  . Hyperlipidemia   . Hypertension   . Irritable bowel syndrome 11/19/2010  . Neuropathy   . Pneumonia YRS AGO  . Restless legs   . Shortness of breath    07/14/2018- uses  4 times a day    Family History  Problem Relation Age of  Onset  . Hypertension Father   . Cancer Father   . Heart disease Mother   . Asthma Son        had as a child  . Heart disease Sister   . Breast cancer Sister   . Hypertension Brother     Past Surgical History:  Procedure Laterality Date  . BACK SURGERY    . CHOLECYSTECTOMY    . COLONOSCOPY    . ENDOMETRIAL ABLATION  10/2010  . EYE SURGERY    . HERNIA REPAIR     umbicial hernia  . JOINT REPLACEMENT Left 03/14/2019   Dr. Magnus Ivan hip  . KNEE ARTHROSCOPY Left    06/07/2017 Dr. August Saucer of [facility]  . KNEE CLOSED REDUCTION Right 12/06/2015   Procedure: CLOSED MANIPULATION RIGHT KNEE;  Surgeon: Kerrin Champagne, MD;  Location: [facility] OR;  Service: Orthopedics;  Laterality: Right;  . KNEE CLOSED REDUCTION Right 01/17/2016   Procedure: CLOSED MANIPULATION RIGHT KNEE;  Surgeon: Kerrin Champagne, MD;  Location: [facility] OR;  Service: Orthopedics;  Laterality: Right;  . KNEE JOINT MANIPULATION Right 12/06/2015  . LACRIMAL TUBE INSERTION Bilateral 03/01/2019   Procedure: LACRIMAL TUBE INSERTION;  Surgeon: Aura Camps, MD;  Location: [facility];  Service: Ophthalmology;  Laterality: Bilateral;  . LACRIMAL TUBE REMOVAL Bilateral 05/03/2019   Procedure: BILATERAL NASOLACRIMAL DUCT PROBING, IIRIGATION AND TUBE REMOVAL BOTH EYES;  Surgeon: Aura Camps, MD;  Location: [facility];  Service: Ophthalmology;  Laterality: Bilateral;  . LUMBAR DISC SURGERY  06/03/2015   L 2  L3 L4 L5   . LUMBAR LAMINECTOMY/DECOMPRESSION MICRODISCECTOMY N/A 06/03/2015   Procedure: Bilateral lateral recess decompression L2-3, L3-4, L4-5;  Surgeon: Kerrin Champagne, MD;  Location: [facility];  Service: Orthopedics;  Laterality: N/A;  . LUMBAR LAMINECTOMY/DECOMPRESSION MICRODISCECTOMY N/A 10/02/2016   Procedure: Right L5-S1 Lateral Recess Decompression  microdiscectomy;  Surgeon: Kerrin Champagne, MD;  Location: [facility] OR;  Service: Orthopedics;  Laterality: N/A;  . LUMBAR LAMINECTOMY/DECOMPRESSION MICRODISCECTOMY N/A  07/15/2018   Procedure: LEFT L3-4 MICRODISCECTOMY;  Surgeon: Kerrin Champagne, MD;  Location: [facility];  Service: Orthopedics;  Laterality: N/A;  . svd      x 2  . TEAR DUCT PROBING Bilateral 03/01/2019   Procedure: TEAR DUCT PROBING WITH IRRIGATION;  Surgeon: Aura Camps, MD;  Location[facility];  Service: Ophthalmology;  Laterality: Bilateral;  . TOTAL HIP ARTHROPLASTY Left 03/14/2019   Procedure: LEFT TOTAL HIP ARTHROPLASTY ANTERIOR APPROACH;  Surgeon: Magnus Ivan,  Vanita Panda, MD;  Location: [facility];  Service: Orthopedics;  Laterality: Left;  . TOTAL KNEE ARTHROPLASTY Right 09/06/2015   Procedure: RIGHT TOTAL KNEE ARTHROPLASTY;  Surgeon: Kerrin Champagne, MD;  Location: [facility];  Service: Orthopedics;  Laterality: Right;  . TUBAL LIGATION    . UPPER GASTROINTESTINAL ENDOSCOPY  04/28/11   Social History   Occupational History  . Occupation: unemployed    Associate Professor: UNEMPLOYED  Tobacco Use  . Smoking status: Current Some Day Smoker    Packs/day: 0.25    Years: 32.00    Pack years: 8.00    Types: Cigarettes  . Smokeless tobacco: Never Used  . Tobacco comment: DOWN TO 3 PER DAY  Vaping Use  . Vaping Use: Never used  Substance and Sexual Activity  . Alcohol use: No  . Drug use: No  . Sexual activity: Yes    Birth control/protection: Surgical, Post-menopausal    Comment: tubal ligation

## 2020-02-20 ENCOUNTER — Telehealth: Payer: Self-pay | Admitting: Internal Medicine

## 2020-02-20 ENCOUNTER — Other Ambulatory Visit: Payer: Self-pay | Admitting: Physical Medicine & Rehabilitation

## 2020-02-20 ENCOUNTER — Other Ambulatory Visit: Payer: Self-pay | Admitting: Internal Medicine

## 2020-02-20 ENCOUNTER — Other Ambulatory Visit: Payer: Self-pay | Admitting: Critical Care Medicine

## 2020-02-20 ENCOUNTER — Other Ambulatory Visit: Payer: Self-pay | Admitting: Cardiovascular Disease

## 2020-02-20 DIAGNOSIS — R911 Solitary pulmonary nodule: Secondary | ICD-10-CM

## 2020-02-20 DIAGNOSIS — F17200 Nicotine dependence, unspecified, uncomplicated: Secondary | ICD-10-CM

## 2020-02-20 DIAGNOSIS — E119 Type 2 diabetes mellitus without complications: Secondary | ICD-10-CM

## 2020-02-20 NOTE — Telephone Encounter (Signed)
Copied from CRM [id number]. Topic: General - Inquiry >> Feb 20, 2020 12:21 PM Adrian Prince D wrote: Reason for CRM: Patient wanted to know when she was going to have her recheck of the spot on her left lung. It was due in January. She can be reached at [phone]. Please advise

## 2020-02-20 NOTE — Telephone Encounter (Signed)
Copied from CRM [id number]. Topic: Quick Communication - Rx Refill/Question >> Feb 20, 2020 12:19 PM Adrian Prince D wrote: Medication: Nicotine patches (Nicoderm CQ) and Accu-Chek Aviva Plus Test Strips  Has the patient contacted their pharmacy? Yes.   (Agent: If no, request that the patient contact the pharmacy for the refill.) (Agent: If yes, when and what did the pharmacy advise?)  Preferred Pharmacy (with phone number or street name):  [facility] - [place], [place] - [address] Agent: Please be advised that RX refills may take up to 3 business days. We ask that you follow-up with your pharmacy.

## 2020-02-20 NOTE — Telephone Encounter (Signed)
Last ov  on 01/12/20 states Incidental lung nodule, > 75mm and < 104mm I will plan to schedule on her follow-up visit with me in 4 months

## 2020-02-20 NOTE — Telephone Encounter (Signed)
Requested medication (s) are due for refill today: {yes  Requested medication (s) are on the active medication list: yes  Last refill:  05/19/2018  #30  6 refills  Future visit scheduled: no  Notes to clinic: RX expired 05/19/2019    Requested Prescriptions  Pending Prescriptions Disp Refills   potassium chloride SA (KLOR-CON) 20 MEQ tablet [Pharmacy Med Name: POTASSIUM CHLORIDE CRYS ER 20 MEQ ORAL TABLET EXTENDED RELEASE] 90 tablet 1    Sig: TAKE ONE TABLET BY MOUTH ONCE DAILY      Endocrinology:  Minerals - Potassium Supplementation Passed - 02/20/2020  2:53 PM      Passed - K in normal range and within 360 days    Potassium  Date Value Ref Range Status  05/03/2019 3.5 3.5 - 5.1 mmol/L Final          Passed - Cr in normal range and within 360 days    Creat  Date Value Ref Range Status  03/11/2016 0.90 0.50 - 1.05 mg/dL Final    Comment:      For patients > or = 61 years of age: The upper reference limit for Creatinine is approximately 13% higher for people identified as African-American.      Creatinine, Ser  Date Value Ref Range Status  05/03/2019 0.72 0.44 - 1.00 mg/dL Final   Creatinine, POC  Date Value Ref Range Status  06/10/2016 200 mg/dL Final          Passed - Valid encounter within last 12 months    Recent Outpatient Visits           1 month ago Type 2 diabetes mellitus with diabetic polyneuropathy, without long-term current use of insulin (HCC)   [facility] Marcine Matar, MD   3 months ago Need for influenza vaccination   [facility] Drucilla Chalet, RPH-CPP   6 months ago Type 2 diabetes mellitus with diabetic polyneuropathy, without long-term current use of insulin [facility])   [facility] Marcine Matar, MD   10 months ago Coarse tremors   [facility] Marcine Matar, MD   12 months ago Lupus [facility])   [facility] Marcine Matar, MD       Future Appointments             In 6 days [name], MD [facility]

## 2020-02-21 ENCOUNTER — Telehealth: Payer: Self-pay | Admitting: *Deleted

## 2020-02-21 DIAGNOSIS — F331 Major depressive disorder, recurrent, moderate: Secondary | ICD-10-CM | POA: Diagnosis not present

## 2020-02-21 NOTE — Telephone Encounter (Signed)
Contacted pt to go over appt info for CT. Pt is scheduled for CT on 03/01/20 at 1230pm at [facility]. Pt is to arrive at 1215pm. Pt is aware and doesn't have any questions or concerns

## 2020-02-21 NOTE — Addendum Note (Signed)
Addended by: Jonah Blue B on: 02/21/2020 10:39 AM   Modules accepted: Orders

## 2020-02-21 NOTE — Telephone Encounter (Signed)
   [facility] Pre-operative Risk Assessment    Request for surgical clearance:  1. What type of surgery is being performed? colonoscopy   2. When is this surgery scheduled? 03/18/20   3. What type of clearance is required (medical clearance vs. Pharmacy clearance to hold med vs. Both)? pharmacy  4. Are there any medications that need to be held prior to surgery and how long? Plavix x 5 days   5. Practice name and name of physician performing surgery? [facility]   6. What is the office phone number? [phone]   7.   What is the office fax number? [phone]  8.   Anesthesia type (None, local, MAC, general) ? propofol   Stacy [name] 02/21/2020, 5:11 PM  _________________________________________________________________

## 2020-02-22 ENCOUNTER — Telehealth (INDEPENDENT_AMBULATORY_CARE_PROVIDER_SITE_OTHER): Payer: Self-pay

## 2020-02-22 NOTE — Telephone Encounter (Signed)
   Chart reviewed as part of pre-operative protocol coverage.Miss  Stacy [name] was last seen by Dr. Allyson Sabal 09/12/19. Echo 08/2019 with normal LVEF, moderate pulmonary hypertension. Lower extremity ABI 09/05/19 with stable bilateral ABI with no significant PAD. Recommended for follow up as-needed.   As Miss [name] (Plavix) is prescribed by her primary care provider, Dr. Jonah Blue, her primary care office will need to address holding her Plavix.   Will route to requesting party as well as PCP so they are aware.   Will remove from preop pool.  Alver Sorrow, NP 02/22/2020, 9:13 AM

## 2020-02-22 NOTE — Telephone Encounter (Signed)
Patient is aware that CT is scheduled for 03/01/20 arrive by 12:15. Maryjean Morn, CMA    Copied from CRM [id number]. Topic: General - Call Back - No Documentation >> Feb 21, 2020 12:39 PM Randol Kern wrote: [phone] Returned call for CT scheduling during lunch, please advise

## 2020-02-22 NOTE — Telephone Encounter (Signed)
Contacted pt to go over Dr. Johnson message pt is aware and doesn't have any questions or concerns  

## 2020-02-26 ENCOUNTER — Ambulatory Visit (INDEPENDENT_AMBULATORY_CARE_PROVIDER_SITE_OTHER): Payer: Medicaid Other | Admitting: Orthopaedic Surgery

## 2020-02-26 DIAGNOSIS — S82242D Displaced spiral fracture of shaft of left tibia, subsequent encounter for closed fracture with routine healing: Secondary | ICD-10-CM | POA: Diagnosis not present

## 2020-02-26 DIAGNOSIS — J3081 Allergic rhinitis due to animal (cat) (dog) hair and dander: Secondary | ICD-10-CM | POA: Diagnosis not present

## 2020-02-26 DIAGNOSIS — J3089 Other allergic rhinitis: Secondary | ICD-10-CM | POA: Diagnosis not present

## 2020-02-26 DIAGNOSIS — J301 Allergic rhinitis due to pollen: Secondary | ICD-10-CM | POA: Diagnosis not present

## 2020-02-26 DIAGNOSIS — S83242D Other tear of medial meniscus, current injury, left knee, subsequent encounter: Secondary | ICD-10-CM | POA: Diagnosis not present

## 2020-02-26 NOTE — Progress Notes (Signed)
Office Visit Note   Patient: Stacy [name]           Date of Birth: 10-03-1960           MRN: [id number] Visit Date: 02/26/2020              Requested by: Marcine Matar, MD [address] [place],  [place] [address] PCP: Marcine Matar, MD   Assessment & Plan: Visit Diagnoses:  1. Acute medial meniscus tear, left, subsequent encounter     Plan: Given her mechanical symptoms of the left knee combined with the MRI findings showing meniscal tear we are recommending an arthroscopic intervention of the left knee.  I showed her knee model and gave her an MRI report.  I explained in detail the recommendation for the surgery as well as description of the risk and benefits involved.  I talked about her interoperative and postoperative course.  She is interested in having this done.  We will work on getting it scheduled and then see her back in 1 week postoperative.  All questions and concerns were answered and addressed.  Follow-Up Instructions: Return for 1 week post-op.   Orders:  No orders of the defined types were placed in this encounter.  No orders of the defined types were placed in this encounter.     Procedures: No procedures performed   Clinical Data: No additional findings.   Subjective: Chief Complaint  Patient presents with  . Left Knee - Pain  The patient comes in for follow-up with me after one of my partners obtained a MRI of her left knee showing a horizontal tear of the medial meniscus of the left knee.  She is in a knee immobilizer.  She says the knee gives way on her and locks and catches.  She has a history of a left hip that I replaced remotely.  She does ambulate using a rolling walker.  She is 60 years old.  She is a diabetic and a smoker and is on Plavix.  HPI  Review of Systems She currently denies any headache, chest pain, shortness of breath, fever, chills, nausea, vomiting  Objective: Vital Signs: LMP 09/01/2010   Physical  Exam She is alert and orient x3 and in no acute distress Ortho Exam Examination of her left knee shows a positive McMurray's on the medial compartment with a mild effusion.  There is a painful arc of motion of her left knee as well. Specialty Comments:  No specialty comments available.  Imaging: No results found. The MRI of her left knee is reviewed with her and it does show horizontal tear of the posterior horn to mid body of the medial meniscus.  There is cartilage thinning throughout the knee but no full-thickness cartilage defect.  The lateral meniscus and collateral ligaments as well as the cruciate ligaments are all intact.  PMFS History: Patient Active Problem List   Diagnosis Date Noted  . Chronic pain of left knee 02/15/2020  . Paresthesia of skin 11/16/2019  . History of total knee replacement, right 11/16/2019  . Tobacco abuse 11/16/2019  . Centrilobular emphysema (HCC) 08/10/2019  . Incidental lung nodule, > 62mm and < 81mm 08/10/2019  . OSA on CPAP 08/10/2019  . Dyspnea on exertion 08/02/2019  . Hyperlipidemia 08/02/2019  . Lumbar radiculopathy 04/24/2019  . 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  . Post laminectomy syndrome 02/23/2019  . Abnormality of gait 02/23/2019  . 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  Unilateral primary osteoarthritis, left hip 12/28/2018  . Lesion of skin of left ear 12/26/2018  . Primary osteoarthritis of left hip 12/02/2018  . Iron deficiency anemia 10/16/2018  . Chronic pain syndrome 09/08/2018  . Chronic pain of right knee 08/11/2018  . Status post lumbar laminectomy 07/15/2018  . Peripheral arterial disease (HCC) 04/05/2018  . Moderate persistent asthma without complication 06/29/2017  . Environmental and seasonal allergies 06/29/2017  . Controlled type 2 diabetes mellitus with diabetic polyneuropathy, without long-term current use of insulin (HCC) 06/29/2017  . Perennial allergic rhinitis 04/08/2017  . Sensorineural hearing loss  (SNHL), bilateral 04/08/2017  . Chronic pansinusitis 03/25/2017  . Eustachian tube dysfunction, bilateral 03/25/2017  . 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 Herniation of lumbar intervertebral disc with radiculopathy 10/02/2016    Class: Chronic  . Alopecia areata 08/19/2016  . Chondromalacia of both patellae 06/03/2015    Class: Chronic  . Spinal stenosis, lumbar region, with neurogenic claudication 06/03/2015  . Tobacco use disorder 04/25/2015  . DJD (degenerative joint disease) of knee 01/04/2015  . Hemorrhoid 11/14/2014  . Gout of big toe 07/19/2014  . Essential hypertension 08/14/2013  . Gastroesophageal reflux disease without esophagitis 08/14/2013  . COPD (chronic obstructive pulmonary disease) (HCC) 04/17/2011   Past Medical History:  Diagnosis Date  . Allergy    Shellfish, cleaning products  . Anxiety   . Arthritis   . 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 Asthma   . COPD (chronic obstructive pulmonary disease) (HCC)   . Depression   . Diabetes mellitus    Type II  . GERD (gastroesophageal reflux disease)    Pt on Protonix daily  . Glaucoma   . Gout   . Headache(784.0)    otc meds prn  . Hyperlipidemia   . Hypertension   . Irritable bowel syndrome 11/19/2010  . Neuropathy   . Pneumonia YRS AGO  . Restless legs   . Shortness of breath    07/14/2018- uses  4 times a day    Family History  Problem Relation Age of Onset  . Hypertension Father   . Cancer Father   . Heart disease Mother   . Asthma Son        had as a child  . Heart disease Sister   . Breast cancer Sister   . Hypertension Brother     Past Surgical History:  Procedure Laterality Date  . BACK SURGERY    . CHOLECYSTECTOMY    . COLONOSCOPY    . ENDOMETRIAL ABLATION  10/2010  . EYE SURGERY    . HERNIA REPAIR     umbicial hernia  . JOINT REPLACEMENT Left 03/14/2019   Dr. Magnus Ivan hip  . KNEE ARTHROSCOPY Left    06/07/2017 Dr. August Saucer of [facility]  . KNEE CLOSED  REDUCTION Right 12/06/2015   Procedure: CLOSED MANIPULATION RIGHT KNEE;  Surgeon: Kerrin Champagne, MD;  Location: [facility];  Service: Orthopedics;  Laterality: Right;  . KNEE CLOSED REDUCTION Right 01/17/2016   Procedure: CLOSED MANIPULATION RIGHT KNEE;  Surgeon: Kerrin Champagne, MD;  Location: [facility];  Service: Orthopedics;  Laterality: Right;  . KNEE JOINT MANIPULATION Right 12/06/2015  . LACRIMAL TUBE INSERTION Bilateral 03/01/2019   Procedure: LACRIMAL TUBE INSERTION;  Surgeon: Aura Camps, MD;  Location: [facility];  Service: Ophthalmology;  Laterality: Bilateral;  . LACRIMAL TUBE REMOVAL Bilateral 05/03/2019   Procedure: BILATERAL NASOLACRIMAL DUCT PROBING, IIRIGATION AND TUBE REMOVAL BOTH EYES;  Surgeon:  Aura Camps, MD;  Location: [facility];  Service: Ophthalmology;  Laterality: Bilateral;  . LUMBAR DISC SURGERY  06/03/2015   L 2  L3 L4 L5   . LUMBAR LAMINECTOMY/DECOMPRESSION MICRODISCECTOMY N/A 06/03/2015   Procedure: Bilateral lateral recess decompression L2-3, L3-4, L4-5;  Surgeon: Kerrin Champagne, MD;  Location: [facility];  Service: Orthopedics;  Laterality: N/A;  . LUMBAR LAMINECTOMY/DECOMPRESSION MICRODISCECTOMY N/A 10/02/2016   Procedure: Right L5-S1 Lateral Recess Decompression  microdiscectomy;  Surgeon: Kerrin Champagne, MD;  Location: [facility];  Service: Orthopedics;  Laterality: N/A;  . LUMBAR LAMINECTOMY/DECOMPRESSION MICRODISCECTOMY N/A 07/15/2018   Procedure: LEFT L3-4 MICRODISCECTOMY;  Surgeon: Kerrin Champagne, MD;  Location: [facility];  Service: Orthopedics;  Laterality: N/A;  . svd      x 2  . TEAR DUCT PROBING Bilateral 03/01/2019   Procedure: TEAR DUCT PROBING WITH IRRIGATION;  Surgeon: Aura Camps, MD;  Location: [facility];  Service: Ophthalmology;  Laterality: Bilateral;  . TOTAL HIP ARTHROPLASTY Left 03/14/2019   Procedure: LEFT TOTAL HIP ARTHROPLASTY ANTERIOR APPROACH;  Surgeon: [name], MD;  Location: [facility];   Service: Orthopedics;  Laterality: Left;  . TOTAL KNEE ARTHROPLASTY Right 09/06/2015   Procedure: RIGHT TOTAL KNEE ARTHROPLASTY;  Surgeon: Kerrin Champagne, MD;  Location: [facility];  Service: Orthopedics;  Laterality: Right;  . TUBAL LIGATION    . UPPER GASTROINTESTINAL ENDOSCOPY  04/28/11   Social History   Occupational History  . Occupation: unemployed    Associate Professor: UNEMPLOYED  Tobacco Use  . Smoking status: Current Some Day Smoker    Packs/day: 0.25    Years: 32.00    Pack years: 8.00    Types: Cigarettes  . Smokeless tobacco: Never Used  . Tobacco comment: DOWN TO 3 PER DAY  Vaping Use  . Vaping Use: Never used  Substance and Sexual Activity  . Alcohol use: No  . Drug use: No  . Sexual activity: Yes    Birth control/protection: Surgical, Post-menopausal    Comment: tubal ligation

## 2020-02-27 ENCOUNTER — Telehealth: Payer: Self-pay | Admitting: Internal Medicine

## 2020-02-27 NOTE — Telephone Encounter (Signed)
Copied from CRM [id number]. Topic: General - Other >> Feb 27, 2020  1:55 PM Wyonia Hough E wrote: Reason for CRM: [facility] called about the status of the Cardiac clearance sent to the office on 1.20.22/ Pts procedure is scheduled for 2.14.22 and they need her to hold her Plavix for 5 days prior to/ please return to fax# [phone]/ please advise

## 2020-02-28 DIAGNOSIS — F331 Major depressive disorder, recurrent, moderate: Secondary | ICD-10-CM | POA: Diagnosis not present

## 2020-02-28 NOTE — Telephone Encounter (Signed)
Returned [place] call and spoke with Stanton Kidney and per Floreen Comber is in clinic. Left a message with Stanton Kidney to give to [facility] in regards to pt Plavix and Cardiac Clearance. Made aware that cardiology gave cardiac clearance and we made pt aware to hold plavix 5 days prior to procedure and gave call back number

## 2020-03-01 ENCOUNTER — Ambulatory Visit (HOSPITAL_COMMUNITY)
Admission: RE | Admit: 2020-03-01 | Discharge: 2020-03-01 | Disposition: A | Discharge status: Home / Self Care | Payer: Medicaid Other | Source: Ambulatory Visit | Attending: Internal Medicine | Admitting: Internal Medicine

## 2020-03-01 ENCOUNTER — Other Ambulatory Visit: Payer: Self-pay

## 2020-03-01 DIAGNOSIS — J439 Emphysema, unspecified: Secondary | ICD-10-CM | POA: Diagnosis not present

## 2020-03-01 DIAGNOSIS — I2510 Atherosclerotic heart disease of native coronary artery without angina pectoris: Secondary | ICD-10-CM | POA: Diagnosis not present

## 2020-03-01 DIAGNOSIS — F17200 Nicotine dependence, unspecified, uncomplicated: Secondary | ICD-10-CM | POA: Diagnosis present

## 2020-03-01 DIAGNOSIS — R911 Solitary pulmonary nodule: Secondary | ICD-10-CM | POA: Diagnosis present

## 2020-03-01 DIAGNOSIS — J984 Other disorders of lung: Secondary | ICD-10-CM | POA: Diagnosis not present

## 2020-03-01 DIAGNOSIS — I700 Atherosclerosis of aorta: Secondary | ICD-10-CM | POA: Diagnosis not present

## 2020-03-04 ENCOUNTER — Other Ambulatory Visit: Payer: Self-pay

## 2020-03-04 DIAGNOSIS — J449 Chronic obstructive pulmonary disease, unspecified: Secondary | ICD-10-CM | POA: Diagnosis not present

## 2020-03-04 DIAGNOSIS — G4733 Obstructive sleep apnea (adult) (pediatric): Secondary | ICD-10-CM | POA: Diagnosis not present

## 2020-03-05 ENCOUNTER — Telehealth: Payer: Self-pay | Admitting: Orthopaedic Surgery

## 2020-03-05 DIAGNOSIS — Z96642 Presence of left artificial hip joint: Secondary | ICD-10-CM | POA: Diagnosis not present

## 2020-03-05 DIAGNOSIS — M5116 Intervertebral disc disorders with radiculopathy, lumbar region: Secondary | ICD-10-CM | POA: Diagnosis not present

## 2020-03-05 DIAGNOSIS — J3081 Allergic rhinitis due to animal (cat) (dog) hair and dander: Secondary | ICD-10-CM | POA: Diagnosis not present

## 2020-03-05 DIAGNOSIS — J301 Allergic rhinitis due to pollen: Secondary | ICD-10-CM | POA: Diagnosis not present

## 2020-03-05 DIAGNOSIS — G4733 Obstructive sleep apnea (adult) (pediatric): Secondary | ICD-10-CM | POA: Diagnosis not present

## 2020-03-05 DIAGNOSIS — R269 Unspecified abnormalities of gait and mobility: Secondary | ICD-10-CM | POA: Diagnosis not present

## 2020-03-05 DIAGNOSIS — J449 Chronic obstructive pulmonary disease, unspecified: Secondary | ICD-10-CM | POA: Diagnosis not present

## 2020-03-05 DIAGNOSIS — J3089 Other allergic rhinitis: Secondary | ICD-10-CM | POA: Diagnosis not present

## 2020-03-05 DIAGNOSIS — E1140 Type 2 diabetes mellitus with diabetic neuropathy, unspecified: Secondary | ICD-10-CM | POA: Diagnosis not present

## 2020-03-06 DIAGNOSIS — F331 Major depressive disorder, recurrent, moderate: Secondary | ICD-10-CM | POA: Diagnosis not present

## 2020-03-11 ENCOUNTER — Other Ambulatory Visit: Payer: Self-pay | Admitting: Physician Assistant

## 2020-03-11 DIAGNOSIS — J301 Allergic rhinitis due to pollen: Secondary | ICD-10-CM | POA: Diagnosis not present

## 2020-03-11 DIAGNOSIS — J3089 Other allergic rhinitis: Secondary | ICD-10-CM | POA: Diagnosis not present

## 2020-03-11 DIAGNOSIS — J3081 Allergic rhinitis due to animal (cat) (dog) hair and dander: Secondary | ICD-10-CM | POA: Diagnosis not present

## 2020-03-13 DIAGNOSIS — F331 Major depressive disorder, recurrent, moderate: Secondary | ICD-10-CM | POA: Diagnosis not present

## 2020-03-13 DIAGNOSIS — Z01812 Encounter for preprocedural laboratory examination: Secondary | ICD-10-CM | POA: Diagnosis not present

## 2020-03-14 ENCOUNTER — Other Ambulatory Visit: Payer: Self-pay

## 2020-03-14 ENCOUNTER — Encounter (HOSPITAL_BASED_OUTPATIENT_CLINIC_OR_DEPARTMENT_OTHER): Payer: Self-pay | Admitting: Orthopaedic Surgery

## 2020-03-18 ENCOUNTER — Other Ambulatory Visit: Payer: Self-pay

## 2020-03-18 ENCOUNTER — Other Ambulatory Visit (HOSPITAL_COMMUNITY)
Admission: RE | Admit: 2020-03-18 | Discharge: 2020-03-18 | Disposition: A | Discharge status: Home / Self Care | Payer: Medicaid Other | Source: Ambulatory Visit | Attending: Orthopaedic Surgery | Admitting: Orthopaedic Surgery

## 2020-03-18 ENCOUNTER — Encounter (HOSPITAL_BASED_OUTPATIENT_CLINIC_OR_DEPARTMENT_OTHER)
Admission: RE | Admit: 2020-03-18 | Discharge: 2020-03-18 | Disposition: A | Discharge status: Home / Self Care | Payer: Medicaid Other | Source: Ambulatory Visit | Attending: Orthopaedic Surgery | Admitting: Orthopaedic Surgery

## 2020-03-18 DIAGNOSIS — K648 Other hemorrhoids: Secondary | ICD-10-CM | POA: Diagnosis not present

## 2020-03-18 DIAGNOSIS — K635 Polyp of colon: Secondary | ICD-10-CM | POA: Diagnosis not present

## 2020-03-18 DIAGNOSIS — K2970 Gastritis, unspecified, without bleeding: Secondary | ICD-10-CM | POA: Diagnosis not present

## 2020-03-18 DIAGNOSIS — J3089 Other allergic rhinitis: Secondary | ICD-10-CM | POA: Diagnosis not present

## 2020-03-18 DIAGNOSIS — J301 Allergic rhinitis due to pollen: Secondary | ICD-10-CM | POA: Diagnosis not present

## 2020-03-18 DIAGNOSIS — Z01812 Encounter for preprocedural laboratory examination: Secondary | ICD-10-CM | POA: Insufficient documentation

## 2020-03-18 DIAGNOSIS — Z1211 Encounter for screening for malignant neoplasm of colon: Secondary | ICD-10-CM | POA: Diagnosis not present

## 2020-03-18 DIAGNOSIS — K5730 Diverticulosis of large intestine without perforation or abscess without bleeding: Secondary | ICD-10-CM | POA: Diagnosis not present

## 2020-03-18 DIAGNOSIS — D120 Benign neoplasm of cecum: Secondary | ICD-10-CM | POA: Diagnosis not present

## 2020-03-18 DIAGNOSIS — Z20822 Contact with and (suspected) exposure to covid-19: Secondary | ICD-10-CM | POA: Insufficient documentation

## 2020-03-18 DIAGNOSIS — R12 Heartburn: Secondary | ICD-10-CM | POA: Diagnosis not present

## 2020-03-18 DIAGNOSIS — K2951 Unspecified chronic gastritis with bleeding: Secondary | ICD-10-CM | POA: Diagnosis not present

## 2020-03-18 DIAGNOSIS — J3081 Allergic rhinitis due to animal (cat) (dog) hair and dander: Secondary | ICD-10-CM | POA: Diagnosis not present

## 2020-03-18 DIAGNOSIS — K2930 Chronic superficial gastritis without bleeding: Secondary | ICD-10-CM | POA: Diagnosis not present

## 2020-03-18 LAB — BASIC METABOLIC PANEL
Anion gap: 12 (ref 5–15)
BUN: 7 mg/dL (ref 6–20)
CO2: 26 mmol/L (ref 22–32)
Calcium: 9.1 mg/dL (ref 8.9–10.3)
Chloride: 100 mmol/L (ref 98–111)
Creatinine, Ser: 0.74 mg/dL (ref 0.44–1.00)
GFR, Estimated: >60 mL/min (ref >60)
Glucose, Bld: 169 mg/dL — ABNORMAL HIGH (ref 70–99)
Potassium: 3.7 mmol/L (ref 3.5–5.1)
Sodium: 138 mmol/L (ref 135–145)

## 2020-03-18 NOTE — Progress Notes (Signed)

## 2020-03-19 LAB — SARS CORONAVIRUS 2 (TAT 6-24 HRS): SARS Coronavirus 2: NEGATIVE

## 2020-03-20 ENCOUNTER — Other Ambulatory Visit: Payer: Self-pay

## 2020-03-20 ENCOUNTER — Ambulatory Visit
Admission: RE | Admit: 2020-03-20 | Discharge: 2020-03-20 | Disposition: A | Discharge status: Home / Self Care | Payer: Medicaid Other | Source: Ambulatory Visit | Attending: Gastroenterology | Admitting: Gastroenterology

## 2020-03-20 ENCOUNTER — Other Ambulatory Visit: Payer: Self-pay | Admitting: Gastroenterology

## 2020-03-20 DIAGNOSIS — R112 Nausea with vomiting, unspecified: Secondary | ICD-10-CM

## 2020-03-20 DIAGNOSIS — Z9049 Acquired absence of other specified parts of digestive tract: Secondary | ICD-10-CM | POA: Diagnosis not present

## 2020-03-20 DIAGNOSIS — F331 Major depressive disorder, recurrent, moderate: Secondary | ICD-10-CM | POA: Diagnosis not present

## 2020-03-21 ENCOUNTER — Ambulatory Visit (HOSPITAL_BASED_OUTPATIENT_CLINIC_OR_DEPARTMENT_OTHER): Payer: Medicaid Other | Admitting: Anesthesiology

## 2020-03-21 ENCOUNTER — Encounter (HOSPITAL_BASED_OUTPATIENT_CLINIC_OR_DEPARTMENT_OTHER)
Admission: RE | Disposition: A | Discharge status: Home / Self Care | Payer: Self-pay | Source: Home / Self Care | Attending: Orthopaedic Surgery

## 2020-03-21 ENCOUNTER — Encounter (HOSPITAL_BASED_OUTPATIENT_CLINIC_OR_DEPARTMENT_OTHER): Payer: Self-pay | Admitting: Orthopaedic Surgery

## 2020-03-21 ENCOUNTER — Other Ambulatory Visit: Payer: Self-pay | Admitting: Internal Medicine

## 2020-03-21 ENCOUNTER — Other Ambulatory Visit: Payer: Self-pay

## 2020-03-21 ENCOUNTER — Ambulatory Visit (HOSPITAL_BASED_OUTPATIENT_CLINIC_OR_DEPARTMENT_OTHER)
Admission: RE | Admit: 2020-03-21 | Discharge: 2020-03-21 | Disposition: A | Discharge status: Home / Self Care | Payer: Medicaid Other | Attending: Orthopaedic Surgery | Admitting: Orthopaedic Surgery

## 2020-03-21 DIAGNOSIS — Z888 Allergy status to other drugs, medicaments and biological substances status: Secondary | ICD-10-CM | POA: Insufficient documentation

## 2020-03-21 DIAGNOSIS — G2581 Restless legs syndrome: Secondary | ICD-10-CM | POA: Diagnosis not present

## 2020-03-21 DIAGNOSIS — E119 Type 2 diabetes mellitus without complications: Secondary | ICD-10-CM | POA: Insufficient documentation

## 2020-03-21 DIAGNOSIS — K589 Irritable bowel syndrome without diarrhea: Secondary | ICD-10-CM | POA: Diagnosis not present

## 2020-03-21 DIAGNOSIS — Z96642 Presence of left artificial hip joint: Secondary | ICD-10-CM | POA: Diagnosis not present

## 2020-03-21 DIAGNOSIS — X58XXXA Exposure to other specified factors, initial encounter: Secondary | ICD-10-CM | POA: Insufficient documentation

## 2020-03-21 DIAGNOSIS — S83242D Other tear of medial meniscus, current injury, left knee, subsequent encounter: Secondary | ICD-10-CM | POA: Diagnosis not present

## 2020-03-21 DIAGNOSIS — Z8249 Family history of ischemic heart disease and other diseases of the circulatory system: Secondary | ICD-10-CM | POA: Insufficient documentation

## 2020-03-21 DIAGNOSIS — Z886 Allergy status to analgesic agent status: Secondary | ICD-10-CM | POA: Diagnosis not present

## 2020-03-21 DIAGNOSIS — M2242 Chondromalacia patellae, left knee: Secondary | ICD-10-CM | POA: Diagnosis not present

## 2020-03-21 DIAGNOSIS — Z96651 Presence of right artificial knee joint: Secondary | ICD-10-CM | POA: Diagnosis not present

## 2020-03-21 DIAGNOSIS — Z79899 Other long term (current) drug therapy: Secondary | ICD-10-CM | POA: Diagnosis not present

## 2020-03-21 DIAGNOSIS — Z7984 Long term (current) use of oral hypoglycemic drugs: Secondary | ICD-10-CM | POA: Diagnosis not present

## 2020-03-21 DIAGNOSIS — J4540 Moderate persistent asthma, uncomplicated: Secondary | ICD-10-CM | POA: Diagnosis not present

## 2020-03-21 DIAGNOSIS — E785 Hyperlipidemia, unspecified: Secondary | ICD-10-CM | POA: Diagnosis not present

## 2020-03-21 DIAGNOSIS — Z791 Long term (current) use of non-steroidal anti-inflammatories (NSAID): Secondary | ICD-10-CM | POA: Insufficient documentation

## 2020-03-21 DIAGNOSIS — D509 Iron deficiency anemia, unspecified: Secondary | ICD-10-CM | POA: Diagnosis not present

## 2020-03-21 DIAGNOSIS — J449 Chronic obstructive pulmonary disease, unspecified: Secondary | ICD-10-CM | POA: Diagnosis not present

## 2020-03-21 DIAGNOSIS — F17200 Nicotine dependence, unspecified, uncomplicated: Secondary | ICD-10-CM | POA: Diagnosis not present

## 2020-03-21 DIAGNOSIS — F17210 Nicotine dependence, cigarettes, uncomplicated: Secondary | ICD-10-CM | POA: Insufficient documentation

## 2020-03-21 DIAGNOSIS — H409 Unspecified glaucoma: Secondary | ICD-10-CM | POA: Insufficient documentation

## 2020-03-21 DIAGNOSIS — Z7951 Long term (current) use of inhaled steroids: Secondary | ICD-10-CM | POA: Insufficient documentation

## 2020-03-21 DIAGNOSIS — I10 Essential (primary) hypertension: Secondary | ICD-10-CM | POA: Insufficient documentation

## 2020-03-21 DIAGNOSIS — S83282A Other tear of lateral meniscus, current injury, left knee, initial encounter: Secondary | ICD-10-CM | POA: Diagnosis not present

## 2020-03-21 DIAGNOSIS — Z9049 Acquired absence of other specified parts of digestive tract: Secondary | ICD-10-CM | POA: Diagnosis not present

## 2020-03-21 DIAGNOSIS — S83242A Other tear of medial meniscus, current injury, left knee, initial encounter: Secondary | ICD-10-CM | POA: Diagnosis not present

## 2020-03-21 HISTORY — PX: KNEE ARTHROSCOPY: SHX127

## 2020-03-21 LAB — GLUCOSE, CAPILLARY
Glucose-Capillary: 112 mg/dL — ABNORMAL HIGH (ref 70–99)
Glucose-Capillary: 124 mg/dL — ABNORMAL HIGH (ref 70–99)

## 2020-03-21 SURGERY — ARTHROSCOPY, KNEE
Anesthesia: General | Site: Knee | Laterality: Left

## 2020-03-21 MED ORDER — LACTATED RINGERS IV SOLN: INTRAVENOUS | Status: DC

## 2020-03-21 MED ORDER — DEXAMETHASONE SODIUM PHOSPHATE 10 MG/ML IJ SOLN
INTRAMUSCULAR | Status: AC
  Filled 2020-03-21: qty 1

## 2020-03-21 MED ORDER — ONDANSETRON HCL 4 MG/2ML IJ SOLN
INTRAMUSCULAR | Status: DC | PRN
  Administered 2020-03-21: 4 mg via INTRAVENOUS

## 2020-03-21 MED ORDER — PROPOFOL 10 MG/ML IV BOLUS
INTRAVENOUS | Status: DC | PRN
  Administered 2020-03-21: 150 mg via INTRAVENOUS
  Administered 2020-03-21: 50 mg via INTRAVENOUS

## 2020-03-21 MED ORDER — ONDANSETRON HCL 4 MG/2ML IJ SOLN
INTRAMUSCULAR | Status: AC
  Filled 2020-03-21: qty 2

## 2020-03-21 MED ORDER — PHENYLEPHRINE HCL (PRESSORS) 10 MG/ML IV SOLN
INTRAVENOUS | Status: DC | PRN
  Administered 2020-03-21 (×2): 80 ug via INTRAVENOUS
  Administered 2020-03-21 (×2): 120 ug via INTRAVENOUS

## 2020-03-21 MED ORDER — FENTANYL CITRATE (PF) 100 MCG/2ML IJ SOLN
INTRAMUSCULAR | Status: AC
  Filled 2020-03-21: qty 2

## 2020-03-21 MED ORDER — LIDOCAINE HCL (PF) 1 % IJ SOLN
INTRAMUSCULAR | Status: AC
  Filled 2020-03-21: qty 30

## 2020-03-21 MED ORDER — AMISULPRIDE (ANTIEMETIC) 5 MG/2ML IV SOLN: 10.0000 mg | Freq: Once | INTRAVENOUS | Status: DC | PRN

## 2020-03-21 MED ORDER — CEFAZOLIN SODIUM-DEXTROSE 2-4 GM/100ML-% IV SOLN: 2.0000 g | INTRAVENOUS | Status: DC

## 2020-03-21 MED ORDER — LACTATED RINGERS IV SOLN: INTRAVENOUS | Status: DC | PRN

## 2020-03-21 MED ORDER — CEFAZOLIN SODIUM-DEXTROSE 2-4 GM/100ML-% IV SOLN
INTRAVENOUS | Status: AC
  Filled 2020-03-21: qty 100

## 2020-03-21 MED ORDER — CEFAZOLIN SODIUM-DEXTROSE 2-3 GM-%(50ML) IV SOLR
INTRAVENOUS | Status: DC | PRN
  Administered 2020-03-21: 2 g via INTRAVENOUS

## 2020-03-21 MED ORDER — HYDROCODONE-ACETAMINOPHEN 5-325 MG PO TABS: 1.0000 | ORAL_TABLET | Freq: Four times a day (QID) | ORAL | 0 refills | Status: DC | PRN

## 2020-03-21 MED ORDER — HYDROMORPHONE HCL 1 MG/ML IJ SOLN: 0.2500 mg | INTRAMUSCULAR | Status: DC | PRN

## 2020-03-21 MED ORDER — SODIUM CHLORIDE 0.9 % IR SOLN
Status: DC | PRN
  Administered 2020-03-21: 6000 mL

## 2020-03-21 MED ORDER — OXYCODONE HCL 5 MG/5ML PO SOLN: 5.0000 mg | Freq: Once | ORAL | Status: DC | PRN

## 2020-03-21 MED ORDER — MIDAZOLAM HCL 2 MG/2ML IJ SOLN
INTRAMUSCULAR | Status: AC
  Filled 2020-03-21: qty 2

## 2020-03-21 MED ORDER — PROPOFOL 10 MG/ML IV BOLUS
INTRAVENOUS | Status: AC
  Filled 2020-03-21: qty 40

## 2020-03-21 MED ORDER — MORPHINE SULFATE (PF) 4 MG/ML IV SOLN
INTRAVENOUS | Status: AC
  Filled 2020-03-21: qty 1

## 2020-03-21 MED ORDER — FENTANYL CITRATE (PF) 100 MCG/2ML IJ SOLN
INTRAMUSCULAR | Status: DC | PRN
  Administered 2020-03-21: 25 ug via INTRAVENOUS
  Administered 2020-03-21 (×2): 50 ug via INTRAVENOUS

## 2020-03-21 MED ORDER — BUPIVACAINE HCL (PF) 0.5 % IJ SOLN
INTRAMUSCULAR | Status: DC | PRN
  Administered 2020-03-21: 20 mL

## 2020-03-21 MED ORDER — MIDAZOLAM HCL 2 MG/2ML IJ SOLN
INTRAMUSCULAR | Status: DC | PRN
  Administered 2020-03-21: 2 mg via INTRAVENOUS

## 2020-03-21 MED ORDER — BUPIVACAINE HCL (PF) 0.5 % IJ SOLN
INTRAMUSCULAR | Status: AC
  Filled 2020-03-21: qty 30

## 2020-03-21 MED ORDER — PROMETHAZINE HCL 25 MG/ML IJ SOLN: 6.2500 mg | INTRAMUSCULAR | Status: DC | PRN

## 2020-03-21 MED ORDER — OXYCODONE HCL 5 MG PO TABS
5.0000 mg | ORAL_TABLET | Freq: Once | ORAL | Status: DC | PRN
Start: 2020-03-21 — End: 2020-03-21

## 2020-03-21 MED ORDER — LIDOCAINE 2% (20 MG/ML) 5 ML SYRINGE
INTRAMUSCULAR | Status: AC
  Filled 2020-03-21: qty 5

## 2020-03-21 MED ORDER — DEXAMETHASONE SODIUM PHOSPHATE 10 MG/ML IJ SOLN
INTRAMUSCULAR | Status: DC | PRN
  Administered 2020-03-21: 5 mg via INTRAVENOUS

## 2020-03-21 MED ORDER — MEPERIDINE HCL 25 MG/ML IJ SOLN: 6.2500 mg | INTRAMUSCULAR | Status: DC | PRN

## 2020-03-21 MED ORDER — BUPIVACAINE HCL (PF) 0.25 % IJ SOLN
INTRAMUSCULAR | Status: AC
  Filled 2020-03-21: qty 30

## 2020-03-21 MED ORDER — LIDOCAINE HCL (CARDIAC) PF 100 MG/5ML IV SOSY
PREFILLED_SYRINGE | INTRAVENOUS | Status: DC | PRN
  Administered 2020-03-21: 80 mg via INTRAVENOUS

## 2020-03-21 SURGICAL SUPPLY — 34 items
BLADE EXCALIBUR 4.0X13 (MISCELLANEOUS) IMPLANT
BNDG CONFORM 4 STRL LF (GAUZE/BANDAGES/DRESSINGS) ×2 IMPLANT
BNDG ELASTIC 6X5.8 VLCR STR LF (GAUZE/BANDAGES/DRESSINGS) ×2 IMPLANT
COVER WAND RF STERILE (DRAPES) IMPLANT
DISSECTOR  3.8MM X 13CM (MISCELLANEOUS) ×2
DISSECTOR 3.8MM X 13CM (MISCELLANEOUS) ×1 IMPLANT
DRAPE ARTHROSCOPY W/POUCH 90 (DRAPES) ×2 IMPLANT
DRAPE U-SHAPE 47X51 STRL (DRAPES) ×2 IMPLANT
DRSG PAD ABDOMINAL 8X10 ST (GAUZE/BANDAGES/DRESSINGS) ×2 IMPLANT
DURAPREP 26ML APPLICATOR (WOUND CARE) ×2 IMPLANT
ELECT MENISCUS 165MM 90D (ELECTRODE) IMPLANT
ELECT REM PT RETURN 9FT ADLT (ELECTROSURGICAL)
ELECTRODE REM PT RTRN 9FT ADLT (ELECTROSURGICAL) IMPLANT
EXCALIBUR 3.8MM X 13CM (MISCELLANEOUS) IMPLANT
GAUZE SPONGE 4X4 12PLY STRL (GAUZE/BANDAGES/DRESSINGS) ×2 IMPLANT
GAUZE XEROFORM 1X8 LF (GAUZE/BANDAGES/DRESSINGS) ×2 IMPLANT
GLOVE ORTHO TXT STRL SZ7.5 (GLOVE) ×2 IMPLANT
GLOVE SRG 8 PF TXTR STRL LF DI (GLOVE) ×2 IMPLANT
GLOVE SURG ORTHO LTX SZ8 (GLOVE) ×2 IMPLANT
GLOVE SURG UNDER POLY LF SZ8 (GLOVE) ×4
GOWN STRL REUS W/ TWL LRG LVL3 (GOWN DISPOSABLE) ×2 IMPLANT
GOWN STRL REUS W/ TWL XL LVL3 (GOWN DISPOSABLE) ×1 IMPLANT
GOWN STRL REUS W/TWL LRG LVL3 (GOWN DISPOSABLE) ×4
GOWN STRL REUS W/TWL XL LVL3 (GOWN DISPOSABLE) ×2
MANIFOLD NEPTUNE II (INSTRUMENTS) IMPLANT
PACK ARTHROSCOPY DSU (CUSTOM PROCEDURE TRAY) ×2 IMPLANT
PACK BASIN DAY SURGERY FS (CUSTOM PROCEDURE TRAY) ×2 IMPLANT
PADDING CAST COTTON 6X4 STRL (CAST SUPPLIES) ×2 IMPLANT
PENCIL SMOKE EVACUATOR (MISCELLANEOUS) IMPLANT
SUT ETHILON 3 0 PS 1 (SUTURE) ×2 IMPLANT
TOWEL GREEN STERILE FF (TOWEL DISPOSABLE) ×2 IMPLANT
TUBING ARTHROSCOPY IRRIG 16FT (MISCELLANEOUS) ×2 IMPLANT
WATER STERILE IRR 1000ML POUR (IV SOLUTION) ×2 IMPLANT
WRAP KNEE MAXI GEL POST OP (GAUZE/BANDAGES/DRESSINGS) ×2 IMPLANT

## 2020-03-21 NOTE — Anesthesia Procedure Notes (Signed)
Procedure Name: LMA Insertion Performed by: Kaydence Menard M, CRNA Pre-anesthesia Checklist: Patient identified, Emergency Drugs available, Suction available and Patient being monitored Patient Re-evaluated:Patient Re-evaluated prior to induction Oxygen Delivery Method: Circle system utilized Preoxygenation: Pre-oxygenation with 100% oxygen Induction Type: IV induction Ventilation: Mask ventilation without difficulty LMA: LMA inserted LMA Size: 4.0 Number of attempts: 1 Airway Equipment and Method: Bite block Placement Confirmation: positive ETCO2,  CO2 detector and breath sounds checked- equal and bilateral Tube secured with: Tape Dental Injury: Teeth and Oropharynx as per pre-operative assessment        

## 2020-03-21 NOTE — Anesthesia Postprocedure Evaluation (Signed)
Anesthesia Post Note  Patient: Stacy [name]  Procedure(s) Performed: LEFT KNEE ARTHROSCOPY WITH PARTIAL MEDIAL MENISCECTOMY (Left Knee)     Patient location during evaluation: PACU Anesthesia Type: General Level of consciousness: awake and alert Pain management: pain level controlled Vital Signs Assessment: post-procedure vital signs reviewed and stable Respiratory status: spontaneous breathing, nonlabored ventilation and respiratory function stable Cardiovascular status: blood pressure returned to baseline and stable Postop Assessment: no apparent nausea or vomiting Anesthetic complications: no   No complications documented.  Last Vitals:  Vitals:   03/21/20 0913 03/21/20 0925  BP:  (!) 165/94  Pulse:  79  Resp:  14  Temp:  36.9 C  SpO2: 97% 97%    Last Pain:  Vitals:   03/21/20 0907  TempSrc:   PainSc: 0-No pain                 Lowella Curb

## 2020-03-21 NOTE — Brief Op Note (Signed)
03/21/2020  8:07 AM  PATIENT:  Stacy [name]  60 y.o. female  PRE-OPERATIVE DIAGNOSIS:  left knee medial meniscal tear  POST-OPERATIVE DIAGNOSIS:  left knee medial meniscal tear  PROCEDURE:  Procedure(s): LEFT KNEE ARTHROSCOPY WITH PARTIAL MEDIAL MENISCECTOMY (Left)  SURGEON:  Surgeon(s) and Role:    [name], MD - Primary  PHYSICIAN ASSISTANT: Rexene Edison, PA-C  ANESTHESIA:   local and general  EBL:  15 mL   COUNTS:  YES  TOURNIQUET:  * No tourniquets in log *  PLAN OF CARE: Discharge to home after PACU  PATIENT DISPOSITION:  PACU - hemodynamically stable.   Delay start of Pharmacological VTE agent (>24hrs) due to surgical blood loss or risk of bleeding: no

## 2020-03-21 NOTE — Op Note (Signed)
[name], [name] MEDICAL RECORD NO:[id number] ACCOUNT [id number] DATE OF BIRTH:1960/06/15 FACILITY: [facility] LOCATION: [facility] PHYSICIAN:Stacy Voight Aretha Parrot, MD  OPERATIVE REPORT  DATE OF PROCEDURE:  03/21/2020  PREOPERATIVE DIAGNOSES:  Left knee meniscal tear and chondromalacia of the patellofemoral joint.  POSTOPERATIVE DIAGNOSES:  Left knee meniscal tear and chondromalacia of the patellofemoral joint.  PROCEDURE:  Left knee arthroscopy with debridement including partial lateral meniscectomy and chondroplasty of the trochlea groove.  FINDINGS: 1.  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 2.  Small posterolateral meniscal tear.  SURGEON:  Stacy [name]. Stacy Ivan, MD  ASSISTANT:  Stacy Canal, PA-C  ANESTHESIA: 1.  General. 2.  Local with 0.5% plain Marcaine.  ESTIMATED BLOOD LOSS:  Minimal.  COMPLICATIONS:  None.  TOURNIQUET TIME:  No tourniquet was used.  INDICATIONS:  The patient is a 60 year old female with debilitating pain involving her left knee.  She has tried and failed conservative treatment of the knee.  An MRI was obtained that suggested a meniscal tear in her knee.  With continued locking and  catching, we did recommend arthroscopic intervention with fairly conservative treatment as well.  The risks and benefits of surgery were explained in detail and she did wish to proceed.  DESCRIPTION OF PROCEDURE:  After informed consent was obtained, appropriate left knee was marked.  She was brought to the operating room and placed supine on the operating table.  General anesthesia was then obtained.  The bed was raised and a lateral  leg post was utilized.  The left thigh, knee, leg, and ankle were prepped and draped with DuraPrep and sterile drapes including a sterile stockinette with the bed raised and the left knee flexed off the side of table.  Timeout was called and she was  identified as correct patient, correct left knee.  We then made an  incision over the anterolateral aspect of the knee, inserted a cannula in the knee and did not find any effusion to drain.  We placed the camera in the knee and went to the medial  compartment.  I made an anterior and medial arthroscopy portal.  The medial side of the knee was probed and assessed and found to have no medial meniscal tear and normal cartilage on the medial femoral condyle and the medial tibial plateau.  The ACL was  intact.  With the knee in a figure-of-four position, we did find a small lateral meniscal tear on the posterior aspect of the posterior horn.  We had to make a separate accessory portal through the center part of the knee and we performed a partial  lateral meniscectomy over a small area.  We did find grade II chondromalacia of the lateral femoral condyle that was debrided and this was minimal.  We finally assessed the trochlea groove and found a large area of full-thickness cartilage loss in the  trochlea groove.  We were able to debride this back to a stable margin with a chondroplasty using arthroscopic shaver.  There was no significant cartilage wear surprisingly on the patella, but it was more of the trochlea groove.  We then allowed fluid  lavage to the knee and then drained all the fluid from the knee and removed all instrumentation.  We closed the portal sites with interrupted nylon suture.  Marcaine was inserted in the portal sites and the knee joint itself.  A well-padded sterile  dressing was applied.  She was awakened, extubated, and taken to recovery room in stable condition with all final counts being  correct.  No complications noted.  Postoperatively, we will allow her to weightbear as tolerated and gave her postoperative  wound care and followup instructions.  HN/NUANCE  D:03/21/2020 T:03/21/2020 JOB:[id number]

## 2020-03-21 NOTE — Discharge Instructions (Signed)
You can increase her activities as comfort allows and put all of your weight on your left knee when comfortable. Do expect swelling of the left knee -ice and elevation intermittently as needed. You can remove your dressings in 24 to 48 hours and get your incisions wet in the shower with your left knee. After each shower if you should place Band-Aids on the 3 small incisions daily.   Post Anesthesia Home Care Instructions  Activity: Get plenty of rest for the remainder of the day. A responsible individual must stay with you for 24 hours following the procedure.  For the next 24 hours, DO NOT: -Drive a car -Advertising copywriter -Drink alcoholic beverages -Take any medication unless instructed by your physician -Make any legal decisions or sign important papers.  Meals: Start with liquid foods such as gelatin or soup. Progress to regular foods as tolerated. Avoid greasy, spicy, heavy foods. If nausea and/or vomiting occur, drink only clear liquids until the nausea and/or vomiting subsides. Call your physician if vomiting continues.  Special Instructions/Symptoms: Your throat may feel dry or sore from the anesthesia or the breathing tube placed in your throat during surgery. If this causes discomfort, gargle with warm salt water. The discomfort should disappear within 24 hours.  If you had a scopolamine patch placed behind your ear for the management of post- operative nausea and/or vomiting:  1. The medication in the patch is effective for 72 hours, after which it should be removed.  Wrap patch in a tissue and discard in the trash. Wash hands thoroughly with soap and water. 2. You may remove the patch earlier than 72 hours if you experience unpleasant side effects which may include dry mouth, dizziness or visual disturbances. 3. Avoid touching the patch. Wash your hands with soap and water after contact with the patch.

## 2020-03-21 NOTE — Anesthesia Preprocedure Evaluation (Signed)
Anesthesia Evaluation  Patient identified by MRN, date of birth, ID band Patient awake    Reviewed: Allergy & Precautions, NPO status , Patient's Chart, lab work & pertinent test results  History of Anesthesia Complications Negative for: history of anesthetic complications  Airway Mallampati: II  TM Distance: >3 FB Neck ROM: Full    Dental no notable dental hx. (+) Edentulous Upper, Missing,    Pulmonary COPD,  COPD inhaler, Current Smoker and Patient abstained from smoking.,    Pulmonary exam normal breath sounds clear to auscultation       Cardiovascular hypertension, Pt. on medications + Peripheral Vascular Disease  Normal cardiovascular exam Rhythm:Regular Rate:Normal     Neuro/Psych Anxiety Depression negative neurological ROS     GI/Hepatic Neg liver ROS, GERD  Medicated and Poorly Controlled,  Endo/Other  diabetes, Type 2, Oral Hypoglycemic Agents  Renal/GU negative Renal ROS  negative genitourinary   Musculoskeletal  (+) Arthritis ,   Abdominal   Peds  Hematology negative hematology ROS (+)   Anesthesia Other Findings Day of surgery medications reviewed with patient.  Reproductive/Obstetrics negative OB ROS                             Anesthesia Physical  Anesthesia Plan  ASA: III  Anesthesia Plan: General   Post-op Pain Management:    Induction: Intravenous  PONV Risk Score and Plan: 2 and Treatment may vary due to age or medical condition, Ondansetron and Midazolam  Airway Management Planned: LMA  Additional Equipment: None  Intra-op Plan:   Post-operative Plan: Extubation in OR  Informed Consent: I have reviewed the patients History and Physical, chart, labs and discussed the procedure including the risks, benefits and alternatives for the proposed anesthesia with the patient or authorized representative who has indicated his/her understanding and acceptance.      Dental advisory given  Plan Discussed with:   Anesthesia Plan Comments:         Anesthesia Quick Evaluation

## 2020-03-21 NOTE — H&P (Signed)
Stacy [name] is an 60 y.o. female.   Chief Complaint: left knee pain, locking, catching HPI: The patient is a 61 year old female with a known left knee medial meniscal tear that is become symptomatic with locking, catching and knee swelling.  She has had significant pain with weightbearing with her left knee and pivoting activities does cause a lot of problems.  She has tried and failed conservative treatment and has had injections in the knee.  A MRI of the knee confirmed a complex medial meniscal tear.  At this point we have recommended an arthroscopic intervention for her left knee.  Past Medical History:  Diagnosis Date  . Allergy    Shellfish, cleaning products  . Anxiety   . Arthritis   . Arthrofibrosis of total knee replacement ([place])    right  . Asthma   . COPD (chronic obstructive pulmonary disease) ([place])   . Depression   . Diabetes mellitus    Type II  . GERD (gastroesophageal reflux disease)    Pt on Protonix daily  . Glaucoma   . Gout   . Headache(784.0)    otc meds prn  . Hyperlipidemia   . Hypertension   . Irritable bowel syndrome 11/19/2010  . Neuropathy   . Pneumonia YRS AGO  . Restless legs   . Shortness of breath    07/14/2018- uses  4 times a day    Past Surgical History:  Procedure Laterality Date  . BACK SURGERY    . CHOLECYSTECTOMY    . COLONOSCOPY    . ENDOMETRIAL ABLATION  10/2010  . EYE SURGERY    . HERNIA REPAIR     umbicial hernia  . JOINT REPLACEMENT Left 03/14/2019   Dr. Ninfa Linden hip  . KNEE ARTHROSCOPY Left    06/07/2017 Dr. Marlou Sa of [facility]  . KNEE CLOSED REDUCTION Right 12/06/2015   Procedure: CLOSED MANIPULATION RIGHT KNEE;  Surgeon: [name], MD;  Location: [place];  Service: Orthopedics;  Laterality: Right;  . KNEE CLOSED REDUCTION Right 01/17/2016   Procedure: CLOSED MANIPULATION RIGHT KNEE;  Surgeon: [name], MD;  Location: [place];  Service: Orthopedics;  Laterality: Right;  . KNEE JOINT MANIPULATION Right  12/06/2015  . LACRIMAL TUBE INSERTION Bilateral 03/01/2019   Procedure: LACRIMAL TUBE INSERTION;  Surgeon: Gevena Cotton, MD;  Location: [facility];  Service: Ophthalmology;  Laterality: Bilateral;  . LACRIMAL TUBE REMOVAL Bilateral 05/03/2019   Procedure: BILATERAL NASOLACRIMAL DUCT PROBING, IIRIGATION AND TUBE REMOVAL BOTH EYES;  Surgeon: Gevena Cotton, MD;  Location: [place];  Service: Ophthalmology;  Laterality: Bilateral;  . LUMBAR DISC SURGERY  06/03/2015   L 2  L3 L4 L5   . LUMBAR LAMINECTOMY/DECOMPRESSION MICRODISCECTOMY N/A 06/03/2015   Procedure: Bilateral lateral recess decompression L2-3, L3-4, L4-5;  Surgeon: [name], MD;  Location: [place];  Service: Orthopedics;  Laterality: N/A;  . LUMBAR LAMINECTOMY/DECOMPRESSION MICRODISCECTOMY N/A 10/02/2016   Procedure: Right L5-S1 Lateral Recess Decompression  microdiscectomy;  Surgeon: [name], MD;  Location: [place];  Service: Orthopedics;  Laterality: N/A;  . LUMBAR LAMINECTOMY/DECOMPRESSION MICRODISCECTOMY N/A 07/15/2018   Procedure: LEFT L3-4 MICRODISCECTOMY;  Surgeon: [name], MD;  Location: [place];  Service: Orthopedics;  Laterality: N/A;  . svd      x 2  . TEAR DUCT PROBING Bilateral 03/01/2019   Procedure: TEAR DUCT PROBING WITH IRRIGATION;  Surgeon: Gevena Cotton, MD;  Location[facility];  Service: Ophthalmology;  Laterality: Bilateral;  .  TOTAL HIP ARTHROPLASTY Left 03/14/2019   Procedure: LEFT TOTAL HIP ARTHROPLASTY ANTERIOR APPROACH;  Surgeon: Mcarthur Rossetti, MD;  Location: [place];  Service: Orthopedics;  Laterality: Left;  . TOTAL KNEE ARTHROPLASTY Right 09/06/2015   Procedure: RIGHT TOTAL KNEE ARTHROPLASTY;  Surgeon: [name], MD;  Location: [place];  Service: Orthopedics;  Laterality: Right;  . TUBAL LIGATION    . UPPER GASTROINTESTINAL ENDOSCOPY  04/28/11    Family History  Problem Relation Age of Onset  . Hypertension Father   . Cancer Father    . Heart disease Mother   . Asthma Son        had as a child  . Heart disease Sister   . Breast cancer Sister   . Hypertension Brother    Social History:  reports that she has been smoking cigarettes. She has a 8.00 pack-year smoking history. She has never used smokeless tobacco. She reports that she does not drink alcohol and does not use drugs.  Allergies:  Allergies  Allergen Reactions  . Other Shortness Of Breath    UNSPECIFIED AGENTS Allergic to perfumes and cleaning products  . Shellfish Allergy Anaphylaxis    Per allergy test.  . Ace Inhibitors Cough and Other (See Comments)       . Celecoxib     Other reaction(s): upset stomach  . Shellfish-Derived Products Other (See Comments)  . Aspirin Nausea Only    Other reaction(s): stomach upset Other reaction(s): Unknown    Medications Prior to Admission  Medication Sig Dispense Refill  . ACCU-CHEK AVIVA PLUS test strip USE 3 TIMES A DAY AS DIRECTED BY PHYSICIAN 100 each 12  . Accu-Chek Softclix Lancets lancets USE AS INSTRUCTED 3 TIMES A DAY 100 each 12  . albuterol (PROVENTIL) (2.5 MG/3ML) 0.083% nebulizer solution USE ONE VIAL (2.5 MG TOTAL) BY NEBULIZATION EVERY 6 (SIX) HOURS AS NEEDED FOR WHEEZING. 300 mL 0  . ALLERGY RELIEF 10 MG tablet TAKE 1 TABLET (10 MG TOTAL) BY MOUTH DAILY. 30 tablet 11  . allopurinol (ZYLOPRIM) 100 MG tablet TAKE 1 TABLET (100 MG TOTAL) BY MOUTH DAILY. 90 tablet 3  . atorvastatin (LIPITOR) 20 MG tablet TAKE 1 TABLET (20 MG TOTAL) BY MOUTH DAILY. 90 tablet 2  . Blood Glucose Monitoring Suppl (ACCU-CHEK AVIVA PLUS) w/Device KIT 1 each by Does not apply route 3 (three) times daily. 1 kit 0  . budesonide-formoterol (SYMBICORT) 80-4.5 MCG/ACT inhaler INHALE TWO PUFFS BY MOUTH TWICE A DAY (Patient taking differently: Inhale 2 puffs into the lungs 2 (two) times a day.) 1 Inhaler 11  . cetirizine (ZYRTEC) 10 MG tablet 1 tablet    . Cholecalciferol 100000 UNIT/GM POWD 1 capsule    . diclofenac Sodium  (VOLTAREN) 1 % GEL APPLY 4 GRAMS TO AFFECTED AREAS TOPICALLY FOUR TIMES PER DAY    . DULoxetine (CYMBALTA) 60 MG capsule Take 1 capsule (60 mg total) by mouth daily. 30 capsule 3  . EPINEPHrine 0.3 mg/0.3 mL IJ SOAJ injection 0.3 mg IM x 1 PRN for allergic reaction (Patient taking differently: Inject 0.3 mg into the muscle once as needed for anaphylaxis.) 1 Device 1  . [name] 325 (65 Fe) MG tablet TAKE 1 TABLET BY MOUTH 2 (TWO) TIMES DAILY WITH A MEAL. 60 tablet 0  . fluocinonide (LIDEX) 0.05 % external solution 1 application to affected area    . fluticasone (FLONASE) 50 MCG/ACT nasal spray Place 1 spray into both nostrils daily. 16 g 6  . gabapentin (NEURONTIN) 600  MG tablet TAKE ONE TABLET BY MOUTH THREE TIMES A DAY. 90 tablet 1  . glimepiride (AMARYL) 2 MG tablet TAKE 1 TABLET (2 MG TOTAL) BY MOUTH DAILY. 90 tablet 1  . hydrochlorothiazide (HYDRODIURIL) 25 MG tablet TAKE 1 TABLET (25 MG TOTAL) BY MOUTH DAILY. 90 tablet 3  . hydrOXYzine (ATARAX/VISTARIL) 10 MG tablet TAKE 1 TABLET IN THE MORNING AND NOON AND 2 TABLETS IN THE EVENING AS NEEDED 120 tablet 2  . Lancets (ACCU-CHEK SOFT TOUCH) lancets Use as instructed 100 each 12  . Melatonin 3 MG TABS Take 1 tablet (3 mg total) by mouth at bedtime as needed. (Patient taking differently: Take 3 mg by mouth at bedtime as needed (sleep).) 30 tablet 3  . metFORMIN (GLUCOPHAGE-XR) 750 MG 24 hr tablet Take 1 tablet by mouth daily with breakfast.    . methocarbamol (ROBAXIN) 750 MG tablet TAKE 1 TABLET (750 MG TOTAL) BY MOUTH EVERY 12 (TWELVE) HOURS AS NEEDED FOR MUSCLE SPASMS. 40 tablet 1  . montelukast (SINGULAIR) 10 MG tablet Take 10 mg by mouth at bedtime.     . Multiple Vitamin (MULTIVITAMIN WITH MINERALS) TABS tablet Take 1 tablet by mouth daily. CENTRUM SILVER 50 plus    . mupirocin ointment (BACTROBAN) 2 % Apply 1 application topically 3 (three) times daily.    . nicotine (NICODERM CQ - DOSED IN MG/24 HOURS) 14 mg/24hr patch PLACE 1 PATCH ONTO THE  SKIN DAILY 28 patch 0  . pantoprazole (PROTONIX) 40 MG tablet Take 1 tablet (40 mg total) by mouth daily. 30 tablet 2  . polyethylene glycol (MIRALAX / GLYCOLAX) 17 g packet     . potassium chloride SA (KLOR-CON) 20 MEQ tablet TAKE ONE TABLET BY MOUTH ONCE DAILY 90 tablet 1  . promethazine (PHENERGAN) 25 MG tablet TK 1 T PO  Q 6 H PRN FOR NAUSEA OR VOMITING    . Psyllium (METAMUCIL) 0.36 g CAPS     . Respiratory Therapy Supplies (FLUTTER) DEVI Use after breathing treatment 4 times daily 1 each 0  . senna-docusate (SENOKOT S) 8.6-50 MG tablet Take 1 tablet by mouth daily as needed for mild constipation. 30 tablet 2  . simvastatin (ZOCOR) 20 MG tablet TAKE 1 TABLET (20 MG TOTAL) BY MOUTH AT BEDTIME.    Marland Kitchen SPIRIVA HANDIHALER 18 MCG inhalation capsule PLACE 1 CAPSULE (18 MCG TOTAL) INTO INHALER AND INHALE DAILY. 30 capsule 2  . sucralfate (CARAFATE) 1 g tablet Take 1 g by mouth 4 (four) times daily -  with meals and at bedtime.    . valsartan-hydrochlorothiazide (DIOVAN-HCT) 160-12.5 MG tablet TAKE ONE TABLET BY MOUTH ONCE DAILY 30 tablet 2  . clopidogrel (PLAVIX) 75 MG tablet TAKE 1 TABLET (75 MG TOTAL) BY MOUTH DAILY. 30 tablet 2  . dicyclomine (BENTYL) 20 MG tablet TK 1 T PO  QID BEFORE MEALS AND HS FOR 10 DAYS    . GOLYTELY 236 g solution Take by mouth as directed.    . ondansetron (ZOFRAN ODT) 4 MG disintegrating tablet Take 1 tablet (4 mg total) by mouth every 8 (eight) hours as needed for nausea or vomiting. 20 tablet 0  . triamcinolone cream (KENALOG) 0.1 % Apply 1 application topically 2 (two) times daily as needed (lupus rash).       Results for orders placed or performed during the hospital encounter of 03/21/20 (from the past 48 hour(s))  Glucose, capillary     Status: Abnormal   Collection Time: 03/21/20  6:56 AM  Result  Value Ref Range   Glucose-Capillary 112 (H) 70 - 99 mg/dL    Comment: Glucose reference range applies only to samples taken after fasting for at least 8 hours.   No  results found.  Review of Systems  Musculoskeletal: Positive for joint swelling.  All other systems reviewed and are negative.   Blood pressure 113/71, pulse 64, temperature 98.2 F (36.8 C), temperature source Oral, resp. rate 16, height _0  (1.676 m), weight 79.7 kg, last menstrual period 09/01/2010, SpO2 100 %. Physical Exam Vitals reviewed.  Constitutional:      Appearance: Normal appearance.  HENT:     Head: Normocephalic and atraumatic.  Eyes:     Extraocular Movements: Extraocular movements intact.     Pupils: Pupils are equal, round, and reactive to light.  Cardiovascular:     Rate and Rhythm: Normal rate and regular rhythm.  Pulmonary:     Effort: Pulmonary effort is normal.     Breath sounds: Normal breath sounds.  Abdominal:     Palpations: Abdomen is soft.  Musculoskeletal:     Cervical back: Normal range of motion.     Left knee: Effusion present. Decreased range of motion. Tenderness present over the medial joint line. Abnormal meniscus.  Neurological:     Mental Status: She is alert and oriented to person, place, and time.  Psychiatric:        Behavior: Behavior normal.      Assessment/Plan Left knee complex medial meniscal tear  Our plan will be to proceed to surgery today for left knee arthroscopy with a partial medial meniscectomy.  The risks and benefits of surgery been explained in detail and informed consent is obtained.  Mcarthur Rossetti, MD 03/21/2020, 7:15 AM

## 2020-03-21 NOTE — Transfer of Care (Signed)
Immediate Anesthesia Transfer of Care Note  Patient: Stacy [name]  Procedure(s) Performed: LEFT KNEE ARTHROSCOPY WITH PARTIAL MEDIAL MENISCECTOMY (Left Knee)  Patient Location: PACU  Anesthesia Type:General  Level of Consciousness: awake, alert  and oriented  Airway & Oxygen Therapy: Patient Spontanous Breathing and Patient connected to face mask oxygen  Post-op Assessment: Report given to RN and Post -op Vital signs reviewed and stable  Post vital signs: Reviewed and stable  Last Vitals:  Vitals Value Taken Time  BP    Temp    Pulse    Resp    SpO2      Last Pain:  Vitals:   03/21/20 0644  TempSrc: Oral  PainSc: 6          Complications: No complications documented.

## 2020-03-22 ENCOUNTER — Other Ambulatory Visit: Payer: Self-pay | Admitting: Internal Medicine

## 2020-03-22 ENCOUNTER — Other Ambulatory Visit: Payer: Self-pay | Admitting: Physical Medicine & Rehabilitation

## 2020-03-22 ENCOUNTER — Other Ambulatory Visit: Payer: Self-pay | Admitting: Specialist

## 2020-03-22 NOTE — Telephone Encounter (Signed)
Pls advise.  

## 2020-03-25 ENCOUNTER — Encounter (HOSPITAL_BASED_OUTPATIENT_CLINIC_OR_DEPARTMENT_OTHER): Payer: Self-pay | Admitting: Orthopaedic Surgery

## 2020-03-25 DIAGNOSIS — J3089 Other allergic rhinitis: Secondary | ICD-10-CM | POA: Diagnosis not present

## 2020-03-25 DIAGNOSIS — J301 Allergic rhinitis due to pollen: Secondary | ICD-10-CM | POA: Diagnosis not present

## 2020-03-25 DIAGNOSIS — J3081 Allergic rhinitis due to animal (cat) (dog) hair and dander: Secondary | ICD-10-CM | POA: Diagnosis not present

## 2020-03-27 DIAGNOSIS — H5213 Myopia, bilateral: Secondary | ICD-10-CM | POA: Diagnosis not present

## 2020-03-27 DIAGNOSIS — H52223 Regular astigmatism, bilateral: Secondary | ICD-10-CM | POA: Diagnosis not present

## 2020-03-27 DIAGNOSIS — J3081 Allergic rhinitis due to animal (cat) (dog) hair and dander: Secondary | ICD-10-CM | POA: Diagnosis not present

## 2020-03-27 DIAGNOSIS — J3089 Other allergic rhinitis: Secondary | ICD-10-CM | POA: Diagnosis not present

## 2020-03-27 DIAGNOSIS — J301 Allergic rhinitis due to pollen: Secondary | ICD-10-CM | POA: Diagnosis not present

## 2020-03-27 DIAGNOSIS — F331 Major depressive disorder, recurrent, moderate: Secondary | ICD-10-CM | POA: Diagnosis not present

## 2020-03-28 ENCOUNTER — Ambulatory Visit: Payer: Medicaid Other | Admitting: Specialist

## 2020-03-28 ENCOUNTER — Ambulatory Visit (INDEPENDENT_AMBULATORY_CARE_PROVIDER_SITE_OTHER): Payer: Medicaid Other | Admitting: Physician Assistant

## 2020-03-28 ENCOUNTER — Encounter: Payer: Self-pay | Admitting: Physician Assistant

## 2020-03-28 DIAGNOSIS — Z9889 Other specified postprocedural states: Secondary | ICD-10-CM

## 2020-03-28 MED ORDER — HYDROCODONE-ACETAMINOPHEN 5-325 MG PO TABS: 1.0000 | ORAL_TABLET | Freq: Four times a day (QID) | ORAL | 0 refills | Status: DC | PRN

## 2020-03-28 NOTE — Progress Notes (Signed)
    HPI: Ms. Varnum returns today 1 week status post left knee arthroscopy.  She had a partial lateral meniscectomy, chondroplasty the involving the lateral femoral condyle and trochlear groove.  She is still having sniffing and the pain is taking hydrocodone every 6-8 hours.  She denies any fevers or chills.  Physical exam: Left knee full extension flexion beyond 90 degrees.  Port sites well approximated with sutures no signs of infection.  Calf supple nontender.  Impression: Status post left knee arthroscopy 03/21/2020  Plan: She will work on scar tissue mobilization.  Sutures were removed today.  She is able to get the incision wet in the shower no submerging incisions until completely healed.  She will work on range of motion on her own.  She may benefit from supplemental injection in the future.  Questions encouraged and answered at length.  We will see her back in 1 month.                                 ++

## 2020-04-02 DIAGNOSIS — J449 Chronic obstructive pulmonary disease, unspecified: Secondary | ICD-10-CM | POA: Diagnosis not present

## 2020-04-02 DIAGNOSIS — G4733 Obstructive sleep apnea (adult) (pediatric): Secondary | ICD-10-CM | POA: Diagnosis not present

## 2020-04-02 DIAGNOSIS — J3089 Other allergic rhinitis: Secondary | ICD-10-CM | POA: Diagnosis not present

## 2020-04-02 DIAGNOSIS — E1140 Type 2 diabetes mellitus with diabetic neuropathy, unspecified: Secondary | ICD-10-CM | POA: Diagnosis not present

## 2020-04-02 DIAGNOSIS — M5116 Intervertebral disc disorders with radiculopathy, lumbar region: Secondary | ICD-10-CM | POA: Diagnosis not present

## 2020-04-02 DIAGNOSIS — J301 Allergic rhinitis due to pollen: Secondary | ICD-10-CM | POA: Diagnosis not present

## 2020-04-02 DIAGNOSIS — R269 Unspecified abnormalities of gait and mobility: Secondary | ICD-10-CM | POA: Diagnosis not present

## 2020-04-02 DIAGNOSIS — Z96642 Presence of left artificial hip joint: Secondary | ICD-10-CM | POA: Diagnosis not present

## 2020-04-02 DIAGNOSIS — J3081 Allergic rhinitis due to animal (cat) (dog) hair and dander: Secondary | ICD-10-CM | POA: Diagnosis not present

## 2020-04-03 ENCOUNTER — Encounter: Payer: Self-pay | Admitting: Internal Medicine

## 2020-04-03 DIAGNOSIS — F331 Major depressive disorder, recurrent, moderate: Secondary | ICD-10-CM | POA: Diagnosis not present

## 2020-04-03 NOTE — Progress Notes (Signed)
Patient had EGD and colonoscopy done by Dr. Levora Angel on 03/18/2020. EGD: Chronic gastritis with hemorrhage.  Biopsies taken.  Normal duodenal bulb. Colonoscopy 2 polyps removed one 3 mm from the cecum colon and one 6 mm from the sigmoid colon.  Positive diverticulosis in the sigmoid colon.  Positive internal hemorrhoids.  Plan for repeat colonoscopy in 5 to 10 years pending the results of pathology.

## 2020-04-08 ENCOUNTER — Other Ambulatory Visit: Payer: Self-pay

## 2020-04-08 ENCOUNTER — Encounter: Payer: Self-pay | Admitting: Podiatry

## 2020-04-08 ENCOUNTER — Ambulatory Visit: Payer: Medicaid Other | Admitting: Podiatry

## 2020-04-08 DIAGNOSIS — J301 Allergic rhinitis due to pollen: Secondary | ICD-10-CM | POA: Diagnosis not present

## 2020-04-08 DIAGNOSIS — B351 Tinea unguium: Secondary | ICD-10-CM

## 2020-04-08 DIAGNOSIS — Z91018 Allergy to other foods: Secondary | ICD-10-CM | POA: Insufficient documentation

## 2020-04-08 DIAGNOSIS — J3081 Allergic rhinitis due to animal (cat) (dog) hair and dander: Secondary | ICD-10-CM | POA: Insufficient documentation

## 2020-04-08 DIAGNOSIS — E1142 Type 2 diabetes mellitus with diabetic polyneuropathy: Secondary | ICD-10-CM

## 2020-04-08 DIAGNOSIS — I739 Peripheral vascular disease, unspecified: Secondary | ICD-10-CM | POA: Diagnosis not present

## 2020-04-08 DIAGNOSIS — J3089 Other allergic rhinitis: Secondary | ICD-10-CM | POA: Diagnosis not present

## 2020-04-08 DIAGNOSIS — M79676 Pain in unspecified toe(s): Secondary | ICD-10-CM

## 2020-04-08 DIAGNOSIS — M2011 Hallux valgus (acquired), right foot: Secondary | ICD-10-CM

## 2020-04-08 DIAGNOSIS — M2012 Hallux valgus (acquired), left foot: Secondary | ICD-10-CM

## 2020-04-08 NOTE — Progress Notes (Signed)
Subjective: Stacy [name] is a 60 y.o. female patient seen today at risk foot care with history of diabetic neuropathy and painful mycotic nails b/l that are difficult to trim. Pain interferes with ambulation. Aggravating factors include wearing enclosed shoe gear. Pain is relieved with periodic professional debridement.   She voices no new pedal concerns on today's visit.  She is followed by Dr. Allyson Sabal with [facility].  PCP is Dr. Jonah Blue. Last visit was 01/12/2020.  Allergies  Allergen Reactions  . Other Shortness Of Breath    UNSPECIFIED AGENTS Allergic to perfumes and cleaning products  . Shellfish Allergy Anaphylaxis    Per allergy test.  . Ace Inhibitors Cough and Other (See Comments)       . Celecoxib     Other reaction(s): upset stomach  . Shellfish-Derived Products Other (See Comments)  . Aspirin Nausea Only    Other reaction(s): stomach upset Other reaction(s): Unknown  Objective: Physical Exam  General: Patient is a pleasant 60 y.o. African American female in no acute distress, Awake, alert and oriented x 3  Neurovascular status unchanged b/l.  Capillary refill time to digits <4 seconds b/l. Faintly palpable DP pulses b/l. PT pulse faintly palpable left foot. PT pulse nonpalpable right foot. Pedal hair absent b/l. Skin temperature gradient within normal limits b/l. No pain with calf compression b/l. No edema noted b/l.   Pt has subjective symptoms of neuropathy. Protective sensation intact 5/5 intact bilaterally with 10g monofilament b/l. Vibratory sensation intact b/l. Proprioception intact bilaterally.  Dermatological:  Pedal skin with normal turgor, texture and tone bilaterally. No open wounds bilaterally. No interdigital macerations bilaterally. Toenails 1-5 b/l elongated, discolored, dystrophic, thickened, crumbly with subungual debris and tenderness to dorsal palpation.   Musculoskeletal:  Normal muscle strength 5/5 to all lower extremity muscle groups  bilaterally. No pain crepitus or joint limitation noted with ROM b/l. Hallux valgus with bunion deformity noted b/l lower extremities. Patient ambulates independent of any assistive aids.   Assessment and Plan:  1. Pain due to onychomycosis of toenail   2. Hallux valgus, acquired, bilateral   3. Diabetic peripheral neuropathy associated with type 2 diabetes mellitus (HCC)   4. PAD (peripheral artery disease) (HCC)     -Examined patient. -Continue diabetic foot care principles. Literature dispensed on today.  -Toenails 1-5 b/l were debrided in length and girth with sterile nail nippers and dremel without iatrogenic bleeding.  -Patient to continue soft, supportive shoe gear daily. -Patient to report any pedal injuries to medical professional immediately. -Patient/POA to call should there be question/concern in the interim.  Return in about 3 months (around 07/09/2020).  [name], DPM

## 2020-04-10 DIAGNOSIS — F331 Major depressive disorder, recurrent, moderate: Secondary | ICD-10-CM | POA: Diagnosis not present

## 2020-04-15 ENCOUNTER — Ambulatory Visit (INDEPENDENT_AMBULATORY_CARE_PROVIDER_SITE_OTHER): Payer: Medicaid Other | Admitting: Specialist

## 2020-04-15 ENCOUNTER — Other Ambulatory Visit: Payer: Self-pay

## 2020-04-15 ENCOUNTER — Ambulatory Visit (INDEPENDENT_AMBULATORY_CARE_PROVIDER_SITE_OTHER): Payer: Medicaid Other

## 2020-04-15 ENCOUNTER — Encounter: Payer: Self-pay | Admitting: Specialist

## 2020-04-15 VITALS — BP 107/74 | HR 93 | Ht 66.0 in | Wt 176.0 lb

## 2020-04-15 DIAGNOSIS — M5030 Other cervical disc degeneration, unspecified cervical region: Secondary | ICD-10-CM | POA: Diagnosis not present

## 2020-04-15 DIAGNOSIS — G5603 Carpal tunnel syndrome, bilateral upper limbs: Secondary | ICD-10-CM

## 2020-04-15 DIAGNOSIS — R200 Anesthesia of skin: Secondary | ICD-10-CM

## 2020-04-15 DIAGNOSIS — R202 Paresthesia of skin: Secondary | ICD-10-CM

## 2020-04-15 DIAGNOSIS — J3081 Allergic rhinitis due to animal (cat) (dog) hair and dander: Secondary | ICD-10-CM | POA: Diagnosis not present

## 2020-04-15 DIAGNOSIS — J3089 Other allergic rhinitis: Secondary | ICD-10-CM | POA: Diagnosis not present

## 2020-04-15 DIAGNOSIS — J301 Allergic rhinitis due to pollen: Secondary | ICD-10-CM | POA: Diagnosis not present

## 2020-04-15 NOTE — Progress Notes (Signed)
Office Visit Note   Patient: Stacy [name]           Date of Birth: 1960/08/27           MRN: [id number] Visit Date: 04/15/2020              Requested by: Marcine Matar, MD [address] [place],  [place] [address] PCP: Marcine Matar, MD   Assessment & Plan: Visit Diagnoses:  1. Numbness and tingling of right arm   2. Carpal tunnel syndrome, bilateral     Plan: Carpal Tunnel Syndrome  Carpal tunnel syndrome is a condition that causes pain in your hand and arm. The carpal tunnel is a narrow area located on the palm side of your wrist. Repeated wrist motion or certain diseases may cause swelling within the tunnel. This swelling pinches the main nerve in the wrist (median nerve). What are the causes? This condition may be caused by:  Repeated wrist motions.  Wrist injuries.  Arthritis.  A cyst or tumor in the carpal tunnel.  Fluid buildup during pregnancy. Sometimes the cause of this condition is not known. What increases the risk? This condition is more likely to develop in:  People who have jobs that cause them to repeatedly move their wrists in the same motion, such as Health visitor.  Women.  People with certain conditions, such as: ? Diabetes. ? Obesity. ? An underactive thyroid (hypothyroidism). ? Kidney failure. What are the signs or symptoms? Symptoms of this condition include:  A tingling feeling in your fingers, especially in your thumb, index, and middle fingers.  Tingling or numbness in your hand.  An aching feeling in your entire arm, especially when your wrist and elbow are bent for long periods of time.  Wrist pain that goes up your arm to your shoulder.  Pain that goes down into your palm or fingers.  A weak feeling in your hands. You may have trouble grabbing and holding items. Your symptoms may feel worse during the night. How is this diagnosed? This condition is diagnosed with a medical history and physical  exam. You may also have tests, including:  An electromyogram (EMG). This test measures electrical signals sent by your nerves into the muscles.  X-rays. How is this treated? Treatment for this condition includes:  Lifestyle changes. It is important to stop doing or modify the activity that caused your condition.  Physical or occupational therapy.  Medicines for pain and inflammation. This may include medicine that is injected into your wrist.  A wrist splint.  Surgery. Follow these instructions at home: If you have a splint:   Wear it as told by your health care provider. Remove it only as told by your health care provider.  Loosen the splint if your fingers become numb and tingle, or if they turn cold and blue.  Keep the splint clean and dry. General instructions   Take over-the-counter and prescription medicines only as told by your health care provider.  Rest your wrist from any activity that may be causing your pain. If your condition is work related, talk to your employer about changes that can be made, such as getting a wrist pad to use while typing.  If directed, apply ice to the painful area: ? Put ice in a plastic bag. ? Place a towel between your skin and the bag. ? Leave the ice on for 20 minutes, 2-3 times per day.  Keep all follow-up visits as told by  your health care provider. This is important.  Do any exercises as told by your health care provider, physical therapist, or occupational therapist. Contact a health care provider if:  You have new symptoms.  Your pain is not controlled with medicines.  Your symptoms get worse. This information is not intended to replace advice given to you by your health care provider. Make sure you discuss any questions you have with your health care provider. Document Released: 01/17/2000 Document Revised: 05/30/2015 Document Reviewed: 09/30/2016 Elsevier Interactive Patient Education  2017 [employer] Avoid overhead  use of the arms and upward gaze to decrease nerve pinch. Adjust car head rest to prevent neck extension if rearended. Obtain EMG/NCV of your arms to assess for cervical radiculopathy Carpal tunnel syndrome  Follow-Up Instructions: No follow-ups on file.   Orders:  Orders Placed This Encounter  Procedures  . XR Cervical Spine 2 or 3 views   No orders of the defined types were placed in this encounter.     Procedures: No procedures performed   Clinical Data: No additional findings.   Subjective: Chief Complaint  Patient presents with  . Right Upper Arm - Pain    60 year old left handed female with history of bilateral hand numbness and tingling with night pain. She has been experiencing symptoms back to previous TKR and the use of a walker, she has diabetes. Has neck pain. No bowel or bladder difficulty.    Review of Systems  Constitutional: Negative.   HENT: Negative.   Eyes: Negative.   Respiratory: Negative.   Cardiovascular: Negative.   Gastrointestinal: Negative.   Endocrine: Negative.   Genitourinary: Negative.   Musculoskeletal: Negative.   Skin: Negative.   Allergic/Immunologic: Negative.   Neurological: Negative.   Hematological: Negative.   Psychiatric/Behavioral: Negative.      Objective: Vital Signs: BP 107/74 (BP Location: Left Arm, Patient Position: Sitting, Cuff Size: Normal)   Pulse 93   Ht 5\' 6"  (1.676 m)   Wt 176 lb (79.8 kg)   LMP 09/01/2010   BMI 28.41 kg/m   Physical Exam Constitutional:      Appearance: She is well-developed.  HENT:     Head: Normocephalic and atraumatic.  Eyes:     Pupils: Pupils are equal, round, and reactive to light.  Pulmonary:     Effort: Pulmonary effort is normal.     Breath sounds: Normal breath sounds.  Abdominal:     General: Bowel sounds are normal.     Palpations: Abdomen is soft.  Musculoskeletal:        General: Normal range of motion.     Cervical back: Normal range of motion and neck  supple.  Skin:    General: Skin is warm and dry.  Neurological:     Mental Status: She is alert and oriented to person, place, and time.  Psychiatric:        Behavior: Behavior normal.        Thought Content: Thought content normal.        Judgment: Judgment normal.     Right Hand Exam   Tests  Phalen's Sign: negative Tinel's sign (median nerve): negative  Comments:  Right and left tinel's at the elbows over the ulnar n   Left Hand Exam   Tests  Phalen's Sign: negative Tinel's sign (median nerve): negative  Comments:  Right and left tinel's at the elbows over the ulnar n      Specialty Comments:  No specialty comments available.  Imaging: XR Cervical Spine 2 or 3 views  Result Date: 04/15/2020 AP and lateral flexion and extension radiographs show the disc height is narrowed at C6-7, lordosis is well maintained. No acute changes.     PMFS History: Patient Active Problem List   Diagnosis Date Noted  . Herniation of lumbar intervertebral disc with radiculopathy 10/02/2016    Priority: High    Class: Chronic  . Chondromalacia of both patellae 06/03/2015    Priority: High    Class: Chronic  . Allergic rhinitis due to animal (cat) (dog) hair and dander 04/08/2020  . Allergic rhinitis due to pollen 04/08/2020  . Food allergy 04/08/2020  . Acute medial meniscus tear, left, subsequent encounter 03/21/2020  . Chronic pain of left knee 02/15/2020  . Paresthesia of skin 11/16/2019  . History of total knee replacement, right 11/16/2019  . Tobacco abuse 11/16/2019  . Centrilobular emphysema (HCC) 08/10/2019  . Incidental lung nodule, > 50mm and < 86mm 08/10/2019  . OSA on CPAP 08/10/2019  . Dyspnea on exertion 08/02/2019  . Hyperlipidemia 08/02/2019  . Lumbar radiculopathy 04/24/2019  . 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  . Post laminectomy syndrome 02/23/2019  . Abnormality of gait 02/23/2019  . 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 Unilateral primary  osteoarthritis, left hip 12/28/2018  . Lesion of skin of left ear 12/26/2018  . Primary osteoarthritis of left hip 12/02/2018  . Iron deficiency anemia 10/16/2018  . Chronic pain syndrome 09/08/2018  . Chronic pain of right knee 08/11/2018  . Status post lumbar laminectomy 07/15/2018  . Peripheral arterial disease (HCC) 04/05/2018  . Moderate persistent asthma without complication 06/29/2017  . Environmental and seasonal allergies 06/29/2017  . Controlled type 2 diabetes mellitus with diabetic polyneuropathy, without long-term current use of insulin (HCC) 06/29/2017  . Perennial allergic rhinitis 04/08/2017  . Sensorineural hearing loss (SNHL), bilateral 04/08/2017  . Chronic pansinusitis 03/25/2017  . Eustachian tube dysfunction, bilateral 03/25/2017  . 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 Alopecia areata 08/19/2016  . Spinal stenosis, lumbar region, with neurogenic claudication 06/03/2015  . Tobacco use disorder 04/25/2015  . DJD (degenerative joint disease) of knee 01/04/2015  . Hemorrhoid 11/14/2014  . Gout of big toe 07/19/2014  . Essential hypertension 08/14/2013  . Gastroesophageal reflux disease without esophagitis 08/14/2013  . COPD (chronic obstructive pulmonary disease) (HCC) 04/17/2011   Past Medical History:  Diagnosis Date  . Allergy    Shellfish, cleaning products  . Anxiety   . Arthritis   . 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 Asthma   . COPD (chronic obstructive pulmonary disease) (HCC)   . Depression   . Diabetes mellitus    Type II  . GERD (gastroesophageal reflux disease)    Pt on Protonix daily  . Glaucoma   . Gout   . Headache(784.0)    otc meds prn  . Hyperlipidemia   . Hypertension   . Irritable bowel syndrome 11/19/2010  . Neuropathy   . Pneumonia YRS AGO  . Restless legs   . Shortness of breath    07/14/2018- uses  4 times a day    Family History  Problem Relation Age of Onset  . Hypertension Father   . Cancer  Father   . Heart disease Mother   . Asthma Son        had as a child  . Heart disease Sister   . Breast cancer Sister   . Hypertension Brother  Past Surgical History:  Procedure Laterality Date  . BACK SURGERY    . CHOLECYSTECTOMY    . COLONOSCOPY    . ENDOMETRIAL ABLATION  10/2010  . EYE SURGERY    . HERNIA REPAIR     umbicial hernia  . JOINT REPLACEMENT Left 03/14/2019   Dr. Magnus Ivan hip  . KNEE ARTHROSCOPY Left    06/07/2017 Dr. August Saucer of [facility]  . KNEE ARTHROSCOPY Left 03/21/2020   Procedure: LEFT KNEE ARTHROSCOPY WITH PARTIAL MEDIAL MENISCECTOMY;  Surgeon: [name], MD;  Location: [facility];  Service: Orthopedics;  Laterality: Left;  . KNEE CLOSED REDUCTION Right 12/06/2015   Procedure: CLOSED MANIPULATION RIGHT KNEE;  Surgeon: Kerrin Champagne, MD;  Location: [facility];  Service: Orthopedics;  Laterality: Right;  . KNEE CLOSED REDUCTION Right 01/17/2016   Procedure: CLOSED MANIPULATION RIGHT KNEE;  Surgeon: Kerrin Champagne, MD;  Location: [facility];  Service: Orthopedics;  Laterality: Right;  . KNEE JOINT MANIPULATION Right 12/06/2015  . LACRIMAL TUBE INSERTION Bilateral 03/01/2019   Procedure: LACRIMAL TUBE INSERTION;  Surgeon: Aura Camps, MD;  Location: [facility];  Service: Ophthalmology;  Laterality: Bilateral;  . LACRIMAL TUBE REMOVAL Bilateral 05/03/2019   Procedure: BILATERAL NASOLACRIMAL DUCT PROBING, IIRIGATION AND TUBE REMOVAL BOTH EYES;  Surgeon: Aura Camps, MD;  Location: [facility];  Service: Ophthalmology;  Laterality: Bilateral;  . LUMBAR DISC SURGERY  06/03/2015   L 2  L3 L4 L5   . LUMBAR LAMINECTOMY/DECOMPRESSION MICRODISCECTOMY N/A 06/03/2015   Procedure: Bilateral lateral recess decompression L2-3, L3-4, L4-5;  Surgeon: Kerrin Champagne, MD;  Location: [facility];  Service: Orthopedics;  Laterality: N/A;  . LUMBAR LAMINECTOMY/DECOMPRESSION MICRODISCECTOMY N/A 10/02/2016   Procedure: Right L5-S1 Lateral  Recess Decompression  microdiscectomy;  Surgeon: Kerrin Champagne, MD;  Location[facility];  Service: Orthopedics;  Laterality: N/A;  . LUMBAR LAMINECTOMY/DECOMPRESSION MICRODISCECTOMY N/A 07/15/2018   Procedure: LEFT L3-4 MICRODISCECTOMY;  Surgeon: Kerrin Champagne, MD;  Location: [facility];  Service: Orthopedics;  Laterality: N/A;  . svd      x 2  . TEAR DUCT PROBING Bilateral 03/01/2019   Procedure: TEAR DUCT PROBING WITH IRRIGATION;  Surgeon: Aura Camps, MD;  Location: [facility];  Service: Ophthalmology;  Laterality: Bilateral;  . TOTAL HIP ARTHROPLASTY Left 03/14/2019   Procedure: LEFT TOTAL HIP ARTHROPLASTY ANTERIOR APPROACH;  Surgeon: [name], MD;  Location: [facility];  Service: Orthopedics;  Laterality: Left;  . TOTAL KNEE ARTHROPLASTY Right 09/06/2015   Procedure: RIGHT TOTAL KNEE ARTHROPLASTY;  Surgeon: Kerrin Champagne, MD;  Location: [facility];  Service: Orthopedics;  Laterality: Right;  . TUBAL LIGATION    . UPPER GASTROINTESTINAL ENDOSCOPY  04/28/11   Social History   Occupational History  . Occupation: unemployed    Associate Professor: UNEMPLOYED  Tobacco Use  . Smoking status: Current Some Day Smoker    Packs/day: 0.25    Years: 32.00    Pack years: 8.00    Types: Cigarettes  . Smokeless tobacco: Never Used  . Tobacco comment: DOWN TO 3 PER DAY  Vaping Use  . Vaping Use: Never used  Substance and Sexual Activity  . Alcohol use: No  . Drug use: No  . Sexual activity: Yes    Birth control/protection: Surgical, Post-menopausal    Comment: tubal ligation

## 2020-04-15 NOTE — Patient Instructions (Signed)
Plan: Carpal Tunnel Syndrome  Carpal tunnel syndrome is a condition that causes pain in your hand and arm. The carpal tunnel is a narrow area located on the palm side of your wrist. Repeated wrist motion or certain diseases may cause swelling within the tunnel. This swelling pinches the main nerve in the wrist (median nerve). What are the causes? This condition may be caused by:  Repeated wrist motions.  Wrist injuries.  Arthritis.  A cyst or tumor in the carpal tunnel.  Fluid buildup during pregnancy. Sometimes the cause of this condition is not known. What increases the risk? This condition is more likely to develop in:  People who have jobs that cause them to repeatedly move their wrists in the same motion, such as Health visitor.  Women.  People with certain conditions, such as: ? Diabetes. ? Obesity. ? An underactive thyroid (hypothyroidism). ? Kidney failure. What are the signs or symptoms? Symptoms of this condition include:  A tingling feeling in your fingers, especially in your thumb, index, and middle fingers.  Tingling or numbness in your hand.  An aching feeling in your entire arm, especially when your wrist and elbow are bent for long periods of time.  Wrist pain that goes up your arm to your shoulder.  Pain that goes down into your palm or fingers.  A weak feeling in your hands. You may have trouble grabbing and holding items. Your symptoms may feel worse during the night. How is this diagnosed? This condition is diagnosed with a medical history and physical exam. You may also have tests, including:  An electromyogram (EMG). This test measures electrical signals sent by your nerves into the muscles.  X-rays. How is this treated? Treatment for this condition includes:  Lifestyle changes. It is important to stop doing or modify the activity that caused your condition.  Physical or occupational therapy.  Medicines for pain and inflammation.  This may include medicine that is injected into your wrist.  A wrist splint.  Surgery. Follow these instructions at home: If you have a splint:   Wear it as told by your health care provider. Remove it only as told by your health care provider.  Loosen the splint if your fingers become numb and tingle, or if they turn cold and blue.  Keep the splint clean and dry. General instructions   Take over-the-counter and prescription medicines only as told by your health care provider.  Rest your wrist from any activity that may be causing your pain. If your condition is work related, talk to your employer about changes that can be made, such as getting a wrist pad to use while typing.  If directed, apply ice to the painful area: ? Put ice in a plastic bag. ? Place a towel between your skin and the bag. ? Leave the ice on for 20 minutes, 2-3 times per day.  Keep all follow-up visits as told by your health care provider. This is important.  Do any exercises as told by your health care provider, physical therapist, or occupational therapist. Contact a health care provider if:  You have new symptoms.  Your pain is not controlled with medicines.  Your symptoms get worse. This information is not intended to replace advice given to you by your health care provider. Make sure you discuss any questions you have with your health care provider. Document Released: 01/17/2000 Document Revised: 05/30/2015 Document Reviewed: 09/30/2016 Elsevier Interactive Patient Education  2017 [employer]. Avoid overhead use of  the arms and upward gaze to decrease nerve pinch. Adjust car head rest to prevent neck extension if rearended. Obtain EMG/NCV of your arms to assess for cervical radiculopathy Carpal tunnel syndrome

## 2020-04-17 DIAGNOSIS — F331 Major depressive disorder, recurrent, moderate: Secondary | ICD-10-CM | POA: Diagnosis not present

## 2020-04-22 ENCOUNTER — Telehealth: Payer: Self-pay

## 2020-04-22 DIAGNOSIS — J3081 Allergic rhinitis due to animal (cat) (dog) hair and dander: Secondary | ICD-10-CM | POA: Diagnosis not present

## 2020-04-22 DIAGNOSIS — J301 Allergic rhinitis due to pollen: Secondary | ICD-10-CM | POA: Diagnosis not present

## 2020-04-22 DIAGNOSIS — J3089 Other allergic rhinitis: Secondary | ICD-10-CM | POA: Diagnosis not present

## 2020-04-22 NOTE — Telephone Encounter (Signed)
Pt called asking if she can go some where besides [facility].  She states they told her she had to pay 300 to be seen and the pt cant afford that.  Please advise

## 2020-04-22 NOTE — Telephone Encounter (Signed)
I called patient and advised that we have changed order to go to [facility].

## 2020-04-24 ENCOUNTER — Encounter: Payer: Self-pay | Admitting: Neurology

## 2020-04-24 ENCOUNTER — Other Ambulatory Visit: Payer: Self-pay | Admitting: Internal Medicine

## 2020-04-24 DIAGNOSIS — F17200 Nicotine dependence, unspecified, uncomplicated: Secondary | ICD-10-CM

## 2020-04-24 DIAGNOSIS — F331 Major depressive disorder, recurrent, moderate: Secondary | ICD-10-CM | POA: Diagnosis not present

## 2020-04-25 ENCOUNTER — Ambulatory Visit: Payer: Medicaid Other | Admitting: Physician Assistant

## 2020-04-25 ENCOUNTER — Other Ambulatory Visit: Payer: Self-pay | Admitting: Physical Medicine & Rehabilitation

## 2020-04-25 ENCOUNTER — Other Ambulatory Visit: Payer: Self-pay | Admitting: Specialist

## 2020-04-25 ENCOUNTER — Other Ambulatory Visit: Payer: Self-pay | Admitting: Internal Medicine

## 2020-04-25 DIAGNOSIS — I739 Peripheral vascular disease, unspecified: Secondary | ICD-10-CM

## 2020-04-25 DIAGNOSIS — I10 Essential (primary) hypertension: Secondary | ICD-10-CM

## 2020-04-25 DIAGNOSIS — E119 Type 2 diabetes mellitus without complications: Secondary | ICD-10-CM

## 2020-04-29 DIAGNOSIS — J3089 Other allergic rhinitis: Secondary | ICD-10-CM | POA: Diagnosis not present

## 2020-04-29 DIAGNOSIS — J3081 Allergic rhinitis due to animal (cat) (dog) hair and dander: Secondary | ICD-10-CM | POA: Diagnosis not present

## 2020-04-29 DIAGNOSIS — J301 Allergic rhinitis due to pollen: Secondary | ICD-10-CM | POA: Diagnosis not present

## 2020-05-01 DIAGNOSIS — F331 Major depressive disorder, recurrent, moderate: Secondary | ICD-10-CM | POA: Diagnosis not present

## 2020-05-06 DIAGNOSIS — J3089 Other allergic rhinitis: Secondary | ICD-10-CM | POA: Diagnosis not present

## 2020-05-06 DIAGNOSIS — J301 Allergic rhinitis due to pollen: Secondary | ICD-10-CM | POA: Diagnosis not present

## 2020-05-06 DIAGNOSIS — J3081 Allergic rhinitis due to animal (cat) (dog) hair and dander: Secondary | ICD-10-CM | POA: Diagnosis not present

## 2020-05-08 DIAGNOSIS — F331 Major depressive disorder, recurrent, moderate: Secondary | ICD-10-CM | POA: Diagnosis not present

## 2020-05-09 ENCOUNTER — Ambulatory Visit: Payer: Medicaid Other | Admitting: Specialist

## 2020-05-13 DIAGNOSIS — J3081 Allergic rhinitis due to animal (cat) (dog) hair and dander: Secondary | ICD-10-CM | POA: Diagnosis not present

## 2020-05-13 DIAGNOSIS — J301 Allergic rhinitis due to pollen: Secondary | ICD-10-CM | POA: Diagnosis not present

## 2020-05-13 DIAGNOSIS — J3089 Other allergic rhinitis: Secondary | ICD-10-CM | POA: Diagnosis not present

## 2020-05-15 DIAGNOSIS — F331 Major depressive disorder, recurrent, moderate: Secondary | ICD-10-CM | POA: Diagnosis not present

## 2020-05-16 ENCOUNTER — Ambulatory Visit: Payer: Medicaid Other | Admitting: Physical Medicine & Rehabilitation

## 2020-05-21 DIAGNOSIS — J3081 Allergic rhinitis due to animal (cat) (dog) hair and dander: Secondary | ICD-10-CM | POA: Diagnosis not present

## 2020-05-21 DIAGNOSIS — J3089 Other allergic rhinitis: Secondary | ICD-10-CM | POA: Diagnosis not present

## 2020-05-21 DIAGNOSIS — J301 Allergic rhinitis due to pollen: Secondary | ICD-10-CM | POA: Diagnosis not present

## 2020-05-22 DIAGNOSIS — F331 Major depressive disorder, recurrent, moderate: Secondary | ICD-10-CM | POA: Diagnosis not present

## 2020-05-23 ENCOUNTER — Encounter: Payer: Medicaid Other | Attending: Physical Medicine & Rehabilitation | Admitting: Physical Medicine & Rehabilitation

## 2020-05-23 ENCOUNTER — Other Ambulatory Visit: Payer: Self-pay | Admitting: Specialist

## 2020-05-23 ENCOUNTER — Other Ambulatory Visit: Payer: Self-pay | Admitting: Internal Medicine

## 2020-05-23 ENCOUNTER — Telehealth: Payer: Self-pay | Admitting: Specialist

## 2020-05-23 ENCOUNTER — Encounter: Payer: Self-pay | Admitting: Physical Medicine & Rehabilitation

## 2020-05-23 ENCOUNTER — Other Ambulatory Visit: Payer: Self-pay

## 2020-05-23 VITALS — BP 129/77 | HR 93 | Temp 98.3°F | Ht 66.0 in | Wt 176.0 lb

## 2020-05-23 DIAGNOSIS — R269 Unspecified abnormalities of gait and mobility: Secondary | ICD-10-CM

## 2020-05-23 DIAGNOSIS — F17200 Nicotine dependence, unspecified, uncomplicated: Secondary | ICD-10-CM

## 2020-05-23 DIAGNOSIS — G8929 Other chronic pain: Secondary | ICD-10-CM | POA: Diagnosis not present

## 2020-05-23 DIAGNOSIS — M25562 Pain in left knee: Secondary | ICD-10-CM | POA: Diagnosis not present

## 2020-05-23 DIAGNOSIS — Z720 Tobacco use: Secondary | ICD-10-CM

## 2020-05-23 DIAGNOSIS — Z96651 Presence of right artificial knee joint: Secondary | ICD-10-CM | POA: Diagnosis not present

## 2020-05-23 DIAGNOSIS — M25561 Pain in right knee: Secondary | ICD-10-CM | POA: Insufficient documentation

## 2020-05-23 DIAGNOSIS — G894 Chronic pain syndrome: Secondary | ICD-10-CM

## 2020-05-23 MED ORDER — DULOXETINE HCL 60 MG PO CPEP: 60.0000 mg | ORAL_CAPSULE | Freq: Every day | ORAL | 3 refills | Status: DC

## 2020-05-23 NOTE — Telephone Encounter (Signed)
Pt called asking for a refill of voltaren gel and she would like a CB when this is ready for pick up. Pt also wanted to let Dr. Otelia [name] know she told Dr. Allena Katz that she would continue all her care with Dr. Otelia [name]   [phone]

## 2020-05-23 NOTE — Telephone Encounter (Signed)
I called and advised patient that her rx was sent in on 04/25/20 with 6 refills, I advised that she needs to call the pharmacy for a refill.

## 2020-05-23 NOTE — Progress Notes (Signed)
Subjective:    Patient ID: Stacy [name], female    DOB: [birth year], 60 y.o.   MRN: [id number]  HPI Female with pmh/psh RLS, neuropathy, HTN, headache, GERD, DM, COPD, left THA, lumbar spinal stenosis with claudication s/p lumbar surgery x3 (most recent 07/15/2018), DJD, PAD right TKA in 2017 followed up by manipulation x2, left THA in 03/2019 presents LE pain. Initially stated: History taken from chart review and patient. Started ~2018.  States she had a knee pain with buckling.  Denies inciting event.  Getting improve.  Ice/heat improves the pain.  Prolonged standing exacerbates the pain.  Non-radiating. Sharp.  Intermittent.  Denies associated numbness.  Denies falls. Pain limits prolonged standing and ambulation.   Last clinic visit 02/15/2020.  Since that time, pt states she continues to use TENS unit, Lidoderm patch. She is using Voltaren gel.  Her activity level has decreased because her sister recently passed away. She is speaking with her therapists.  Denies falls, no longer using rollator. She has not followed up with Ortho regarding her back. She is smoking 3/day. Left knee pain has improved after scope.   Pain Inventory Average Pain 5 Pain Right Now 6 My pain is sharp and tingling  In the last 24 hours, has pain interfered with the following? General activity 4 Relation with others 6 Enjoyment of life 10 What TIME of day is your pain at its worst? morning,  night Sleep (in general) Fair  Pain is worse with: walking, bending and standing Pain improves with: medication and TENS Relief from Meds: not sure  Family History  Problem Relation Age of Onset  . Hypertension Father   . Cancer Father   . Heart disease Mother   . Asthma Son        had as a child  . Heart disease Sister   . Breast cancer Sister   . Hypertension Brother    Social History   Socioeconomic History  . Marital status: Married    Spouse name: Not on file  . Number of children: 2  . Years of  education: Not on file  . Highest education level: Not on file  Occupational History  . Occupation: unemployed    Associate Professor: UNEMPLOYED  Tobacco Use  . Smoking status: Current Some Day Smoker    Packs/day: 0.25    Years: 32.00    Pack years: 8.00    Types: Cigarettes  . Smokeless tobacco: Never Used  . Tobacco comment: DOWN TO 3 PER DAY  Vaping Use  . Vaping Use: Never used  Substance and Sexual Activity  . Alcohol use: No  . Drug use: No  . Sexual activity: Yes    Birth control/protection: Surgical, Post-menopausal    Comment: tubal ligation  Other Topics Concern  . Not on file  Social History Narrative  . Not on file   Social Determinants of Health   Financial Resource Strain: Not on file  Food Insecurity: Not on file  Transportation Needs: Not on file  Physical Activity: Not on file  Stress: Not on file  Social Connections: Not on file   Past Surgical History:  Procedure Laterality Date  . BACK SURGERY    . CHOLECYSTECTOMY    . COLONOSCOPY    . ENDOMETRIAL ABLATION  10/2010  . EYE SURGERY    . HERNIA REPAIR     umbicial hernia  . JOINT REPLACEMENT Left 03/14/2019   Dr. Magnus Ivan hip  . KNEE ARTHROSCOPY Left    06/07/2017  Dr. August Saucer of [facility]  . KNEE ARTHROSCOPY Left 03/21/2020   Procedure: LEFT KNEE ARTHROSCOPY WITH PARTIAL MEDIAL MENISCECTOMY;  Surgeon: [name], MD;  Location: [facility];  Service: Orthopedics;  Laterality: Left;  . KNEE CLOSED REDUCTION Right 12/06/2015   Procedure: CLOSED MANIPULATION RIGHT KNEE;  Surgeon: Kerrin Champagne, MD;  Location: [facility];  Service: Orthopedics;  Laterality: Right;  . KNEE CLOSED REDUCTION Right 01/17/2016   Procedure: CLOSED MANIPULATION RIGHT KNEE;  Surgeon: Kerrin Champagne, MD;  Location: [facility];  Service: Orthopedics;  Laterality: Right;  . KNEE JOINT MANIPULATION Right 12/06/2015  . LACRIMAL TUBE INSERTION Bilateral 03/01/2019   Procedure: LACRIMAL TUBE INSERTION;  Surgeon: Aura Camps, MD;  Location: [facility];  Service: Ophthalmology;  Laterality: Bilateral;  . LACRIMAL TUBE REMOVAL Bilateral 05/03/2019   Procedure: BILATERAL NASOLACRIMAL DUCT PROBING, IIRIGATION AND TUBE REMOVAL BOTH EYES;  Surgeon: Aura Camps, MD;  Location: [facility];  Service: Ophthalmology;  Laterality: Bilateral;  . LUMBAR DISC SURGERY  06/03/2015   L 2  L3 L4 L5   . LUMBAR LAMINECTOMY/DECOMPRESSION MICRODISCECTOMY N/A 06/03/2015   Procedure: Bilateral lateral recess decompression L2-3, L3-4, L4-5;  Surgeon: Kerrin Champagne, MD;  Location: [facility];  Service: Orthopedics;  Laterality: N/A;  . LUMBAR LAMINECTOMY/DECOMPRESSION MICRODISCECTOMY N/A 10/02/2016   Procedure: Right L5-S1 Lateral Recess Decompression  microdiscectomy;  Surgeon: Kerrin Champagne, MD;  Location: [facility];  Service: Orthopedics;  Laterality: N/A;  . LUMBAR LAMINECTOMY/DECOMPRESSION MICRODISCECTOMY N/A 07/15/2018   Procedure: LEFT L3-4 MICRODISCECTOMY;  Surgeon: Kerrin Champagne, MD;  Location: [facility];  Service: Orthopedics;  Laterality: N/A;  . svd      x 2  . TEAR DUCT PROBING Bilateral 03/01/2019   Procedure: TEAR DUCT PROBING WITH IRRIGATION;  Surgeon: Aura Camps, MD;  Location: [facility];  Service: Ophthalmology;  Laterality: Bilateral;  . TOTAL HIP ARTHROPLASTY Left 03/14/2019   Procedure: LEFT TOTAL HIP ARTHROPLASTY ANTERIOR APPROACH;  Surgeon: [name], MD;  Location: [facility];  Service: Orthopedics;  Laterality: Left;  . TOTAL KNEE ARTHROPLASTY Right 09/06/2015   Procedure: RIGHT TOTAL KNEE ARTHROPLASTY;  Surgeon: Kerrin Champagne, MD;  Location: [facility];  Service: Orthopedics;  Laterality: Right;  . TUBAL LIGATION    . UPPER GASTROINTESTINAL ENDOSCOPY  04/28/11   Past Medical History:  Diagnosis Date  . Allergy    Shellfish, cleaning products  . Anxiety   . Arthritis   . 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 Asthma   . COPD (chronic  obstructive pulmonary disease) (HCC)   . Depression   . Diabetes mellitus    Type II  . GERD (gastroesophageal reflux disease)    Pt on Protonix daily  . Glaucoma   . Gout   . Headache(784.0)    otc meds prn  . Hyperlipidemia   . Hypertension   . Irritable bowel syndrome 11/19/2010  . Neuropathy   . Pneumonia YRS AGO  . Restless legs   . Shortness of breath    07/14/2018- uses  4 times a day   BP 129/77   Pulse 93   Temp 98.3 F (36.8 C)   Ht 5\' 6"  (1.676 m)   Wt 176 lb (79.8 kg)   LMP 09/01/2010   SpO2 92%   BMI 28.41 kg/m   Opioid Risk Score:   Fall Risk Score:  `1  Depression screen PHQ 2/9  Depression  [facility] 05/23/2020 02/15/2020 11/16/2019 07/24/2019 10/14/2018 08/11/2018 08/09/2018  Decreased Interest 1 1 1  0 0 0 0  Down, Depressed, Hopeless 1 1 1  0 0 0 0  PHQ - 2 Score 2 2 2  0 0 0 0  Altered sleeping - - - - - - 0  Tired, decreased energy - - - - - - 0  Change in appetite - - - - - - 0  Feeling bad or failure about yourself  - - - - - - 0  Trouble concentrating - - - - - - 0  Moving slowly or fidgety/restless - - - - - - 0  Suicidal thoughts - - - - - - 0  PHQ-9 Score - - - - - - 0  Difficult doing work/chores - - - - - - Not difficult at all  Some recent data might be hidden    Review of Systems  Constitutional: Positive for diaphoresis.  HENT: Negative.   Eyes: Negative.   Respiratory: Positive for wheezing.        Has cpap now  Cardiovascular: Negative.   Gastrointestinal: Positive for abdominal pain.  Endocrine:       High/low blood sugar  Genitourinary: Negative.   Musculoskeletal: Positive for arthralgias, back pain, gait problem, joint swelling and myalgias.       Spasms Lower back  Skin: Negative.   Allergic/Immunologic: Negative.   Neurological: Positive for numbness.       Tremors are better now/ tingling sometimes  Hematological: Bruises/bleeds easily.       Plavix  Psychiatric/Behavioral: The patient is nervous/anxious.   All  other systems reviewed and are negative.     Objective:   Physical Exam  Constitutional: No distress . Vital signs reviewed. HENT: Normocephalic.  Atraumatic. Eyes: EOMI. No discharge. Cardiovascular: No JVD.   Respiratory: Normal effort.  No stridor.   GI: Non-distended.   Skin: Warm and dry.  Intact. Psych: Normal mood.  Normal behavior. Musc: No edema in extremities.  No tenderness in extremities. Musc: Gait antalgic             No right knee edema or TTP  No TTP left knee  Neuro: Akert  Strength         5/5 in all LE myotomes     Assessment & Plan:  Female with pmh/psh RLS, neuropathy, HTN, headache, GERD, DM, COPD, left THA, lumbar spinal stenosis with claudication s/p lumbar surgery x3 (most recent 07/15/2018), DJD, PAD right TKA in 2017 followed up by manipulation x2, left THA in 03/2019 presents with LE pain.  1. Chronic right knee pain s/p TKA with manipulation x2             MRI from 09/2017 showing postop changes             Cont Heat/Cold             Completed outpatient PT after THA             Continue  E-stim             Will consider bracing if necessary             Continue OTC Lidoderm patch              Continue Cymbalta             Cont Robaxin per Ortho spine             D/c Mobic 15mg   with food while on Voltaren gel (patient prefers), labs on 08/02/19 within acceptable range, Celebrex, Volaren d/ced - educated patient to avoid duplicate therapy with NSAIDs, currently taking Voltaren gel              Patient states main goal is to be more active, was improving until left knee issues  Improved  2. Gait abnormality             Left THA on 03/2019             See #1  No longer requires rollator   Cont HEP  3. LLE pain             Continue Gabapentin to 600 TID - has been on years             See #1  4. Left hip pain now s/p left THA             See #2  5. Low back pain                      Continue follow up with Ortho spine, plan remain  uncertain  6. Tobacco abuse             Now smoking 3 cig/day             Educated/Encouraged cessation again  7. Tremors              See #1  Improved  8. Left knee meniscal tear  S/p scope  Improving

## 2020-05-24 ENCOUNTER — Other Ambulatory Visit: Payer: Self-pay | Admitting: Specialist

## 2020-05-24 NOTE — Progress Notes (Unsigned)
Post-Op Visit Note   Patient: Stacy [name]           Date of Birth: 1960-11-28           MRN: [id number] Visit Date: 05/24/2020 PCP: Marcine Matar, MD   Assessment & Plan:2 weeks following Incision Drainage and debridement of the right ankle Osteomyelitis and sepic pyarthrosis, Staph. a  Chief Complaint: No chief complaint on file.  Visit Diagnoses: No diagnosis found.  Plan: ***  Follow-Up Instructions: No follow-ups on file.   Orders:  No orders of the defined types were placed in this encounter.  No orders of the defined types were placed in this encounter.   Imaging: No results found.  PMFS History: Patient Active Problem List   Diagnosis Date Noted  . Herniation of lumbar intervertebral disc with radiculopathy 10/02/2016    Priority: High    Class: Chronic  . Chondromalacia of both patellae 06/03/2015    Priority: High    Class: Chronic  . Allergic rhinitis due to animal (cat) (dog) hair and dander 04/08/2020  . Allergic rhinitis due to pollen 04/08/2020  . Food allergy 04/08/2020  . Acute medial meniscus tear, left, subsequent encounter 03/21/2020  . Chronic pain of left knee 02/15/2020  . Paresthesia of skin 11/16/2019  . History of total knee replacement, right 11/16/2019  . Tobacco abuse 11/16/2019  . Centrilobular emphysema (HCC) 08/10/2019  . Incidental lung nodule, > 73mm and < 21mm 08/10/2019  . OSA on CPAP 08/10/2019  . Dyspnea on exertion 08/02/2019  . Hyperlipidemia 08/02/2019  . Lumbar radiculopathy 04/24/2019  . 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  . Post laminectomy syndrome 02/23/2019  . Abnormality of gait 02/23/2019  . 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 Unilateral primary osteoarthritis, left hip 12/28/2018  . Lesion of skin of left ear 12/26/2018  . Primary osteoarthritis of left hip 12/02/2018  . Iron deficiency anemia 10/16/2018  . Chronic pain syndrome 09/08/2018  . Chronic pain of right knee 08/11/2018   . Status post lumbar laminectomy 07/15/2018  . Peripheral arterial disease (HCC) 04/05/2018  . Moderate persistent asthma without complication 06/29/2017  . Environmental and seasonal allergies 06/29/2017  . Controlled type 2 diabetes mellitus with diabetic polyneuropathy, without long-term current use of insulin (HCC) 06/29/2017  . Perennial allergic rhinitis 04/08/2017  . Sensorineural hearing loss (SNHL), bilateral 04/08/2017  . Chronic pansinusitis 03/25/2017  . Eustachian tube dysfunction, bilateral 03/25/2017  . 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 Alopecia areata 08/19/2016  . Spinal stenosis, lumbar region, with neurogenic claudication 06/03/2015  . Tobacco use disorder 04/25/2015  . DJD (degenerative joint disease) of knee 01/04/2015  . Hemorrhoid 11/14/2014  . Gout of big toe 07/19/2014  . Essential hypertension 08/14/2013  . Gastroesophageal reflux disease without esophagitis 08/14/2013  . COPD (chronic obstructive pulmonary disease) (HCC) 04/17/2011   Past Medical History:  Diagnosis Date  . Allergy    Shellfish, cleaning products  . Anxiety   . Arthritis   . 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 Asthma   . COPD (chronic obstructive pulmonary disease) (HCC)   . Depression   . Diabetes mellitus    Type II  . GERD (gastroesophageal reflux disease)    Pt on Protonix daily  . Glaucoma   . Gout   . Headache(784.0)    otc meds prn  . Hyperlipidemia   . Hypertension   . Irritable bowel syndrome 11/19/2010  . Neuropathy   .  Pneumonia YRS AGO  . Restless legs   . Shortness of breath    07/14/2018- uses  4 times a day    Family History  Problem Relation Age of Onset  . Hypertension Father   . Cancer Father   . Heart disease Mother   . Asthma Son        had as a child  . Heart disease Sister   . Breast cancer Sister   . Hypertension Brother     Past Surgical History:  Procedure Laterality Date  . BACK SURGERY    . CHOLECYSTECTOMY    .  COLONOSCOPY    . ENDOMETRIAL ABLATION  10/2010  . EYE SURGERY    . HERNIA REPAIR     umbicial hernia  . JOINT REPLACEMENT Left 03/14/2019   Dr. Magnus Ivan hip  . KNEE ARTHROSCOPY Left    06/07/2017 Dr. August Saucer of [facility]  . KNEE ARTHROSCOPY Left 03/21/2020   Procedure: LEFT KNEE ARTHROSCOPY WITH PARTIAL MEDIAL MENISCECTOMY;  Surgeon: [name], MD;  Location: [facility];  Service: Orthopedics;  Laterality: Left;  . KNEE CLOSED REDUCTION Right 12/06/2015   Procedure: CLOSED MANIPULATION RIGHT KNEE;  Surgeon: Kerrin Champagne, MD;  Location: [facility];  Service: Orthopedics;  Laterality: Right;  . KNEE CLOSED REDUCTION Right 01/17/2016   Procedure: CLOSED MANIPULATION RIGHT KNEE;  Surgeon: Kerrin Champagne, MD;  Location: [facility];  Service: Orthopedics;  Laterality: Right;  . KNEE JOINT MANIPULATION Right 12/06/2015  . LACRIMAL TUBE INSERTION Bilateral 03/01/2019   Procedure: LACRIMAL TUBE INSERTION;  Surgeon: Aura Camps, MD;  Location: [facility];  Service: Ophthalmology;  Laterality: Bilateral;  . LACRIMAL TUBE REMOVAL Bilateral 05/03/2019   Procedure: BILATERAL NASOLACRIMAL DUCT PROBING, IIRIGATION AND TUBE REMOVAL BOTH EYES;  Surgeon: Aura Camps, MD;  Location: [facility];  Service: Ophthalmology;  Laterality: Bilateral;  . LUMBAR DISC SURGERY  06/03/2015   L 2  L3 L4 L5   . LUMBAR LAMINECTOMY/DECOMPRESSION MICRODISCECTOMY N/A 06/03/2015   Procedure: Bilateral lateral recess decompression L2-3, L3-4, L4-5;  Surgeon: Kerrin Champagne, MD;  Location: [facility];  Service: Orthopedics;  Laterality: N/A;  . LUMBAR LAMINECTOMY/DECOMPRESSION MICRODISCECTOMY N/A 10/02/2016   Procedure: Right L5-S1 Lateral Recess Decompression  microdiscectomy;  Surgeon: Kerrin Champagne, MD;  Location: [facility] OR;  Service: Orthopedics;  Laterality: N/A;  . LUMBAR LAMINECTOMY/DECOMPRESSION MICRODISCECTOMY N/A 07/15/2018   Procedure: LEFT L3-4 MICRODISCECTOMY;  Surgeon: Kerrin Champagne, MD;  Location: [facility];  Service: Orthopedics;  Laterality: N/A;  . svd      x 2  . TEAR DUCT PROBING Bilateral 03/01/2019   Procedure: TEAR DUCT PROBING WITH IRRIGATION;  Surgeon: Aura Camps, MD;  Location: [facility];  Service: Ophthalmology;  Laterality: Bilateral;  . TOTAL HIP ARTHROPLASTY Left 03/14/2019   Procedure: LEFT TOTAL HIP ARTHROPLASTY ANTERIOR APPROACH;  Surgeon: [name], MD;  Location: [facility];  Service: Orthopedics;  Laterality: Left;  . TOTAL KNEE ARTHROPLASTY Right 09/06/2015   Procedure: RIGHT TOTAL KNEE ARTHROPLASTY;  Surgeon: Kerrin Champagne, MD;  Location: [facility];  Service: Orthopedics;  Laterality: Right;  . TUBAL LIGATION    . UPPER GASTROINTESTINAL ENDOSCOPY  04/28/11   Social History   Occupational History  . Occupation: unemployed    Associate Professor: UNEMPLOYED  Tobacco Use  . Smoking status: Current Some Day Smoker    Packs/day: 0.25    Years: 32.00    Pack years: 8.00  Types: Cigarettes  . Smokeless tobacco: Never Used  . Tobacco comment: DOWN TO 3 PER DAY  Vaping Use  . Vaping Use: Never used  Substance and Sexual Activity  . Alcohol use: No  . Drug use: No  . Sexual activity: Yes    Birth control/protection: Surgical, Post-menopausal    Comment: tubal ligation

## 2020-05-27 DIAGNOSIS — J301 Allergic rhinitis due to pollen: Secondary | ICD-10-CM | POA: Diagnosis not present

## 2020-05-27 DIAGNOSIS — J3089 Other allergic rhinitis: Secondary | ICD-10-CM | POA: Diagnosis not present

## 2020-05-27 DIAGNOSIS — J3081 Allergic rhinitis due to animal (cat) (dog) hair and dander: Secondary | ICD-10-CM | POA: Diagnosis not present

## 2020-05-29 DIAGNOSIS — F331 Major depressive disorder, recurrent, moderate: Secondary | ICD-10-CM | POA: Diagnosis not present

## 2020-06-03 DIAGNOSIS — J3089 Other allergic rhinitis: Secondary | ICD-10-CM | POA: Diagnosis not present

## 2020-06-03 DIAGNOSIS — J301 Allergic rhinitis due to pollen: Secondary | ICD-10-CM | POA: Diagnosis not present

## 2020-06-03 DIAGNOSIS — J3081 Allergic rhinitis due to animal (cat) (dog) hair and dander: Secondary | ICD-10-CM | POA: Diagnosis not present

## 2020-06-05 DIAGNOSIS — F331 Major depressive disorder, recurrent, moderate: Secondary | ICD-10-CM | POA: Diagnosis not present

## 2020-06-06 ENCOUNTER — Other Ambulatory Visit: Payer: Self-pay | Admitting: Internal Medicine

## 2020-06-06 ENCOUNTER — Encounter: Payer: Self-pay | Admitting: Internal Medicine

## 2020-06-06 ENCOUNTER — Other Ambulatory Visit: Payer: Self-pay

## 2020-06-06 ENCOUNTER — Ambulatory Visit: Payer: Medicaid Other | Attending: Internal Medicine | Admitting: Internal Medicine

## 2020-06-06 VITALS — BP 120/84 | HR 77 | Resp 16 | Wt 179.8 lb

## 2020-06-06 DIAGNOSIS — Z7984 Long term (current) use of oral hypoglycemic drugs: Secondary | ICD-10-CM | POA: Diagnosis not present

## 2020-06-06 DIAGNOSIS — E1142 Type 2 diabetes mellitus with diabetic polyneuropathy: Secondary | ICD-10-CM | POA: Diagnosis present

## 2020-06-06 DIAGNOSIS — I10 Essential (primary) hypertension: Secondary | ICD-10-CM | POA: Diagnosis not present

## 2020-06-06 DIAGNOSIS — M329 Systemic lupus erythematosus, unspecified: Secondary | ICD-10-CM | POA: Insufficient documentation

## 2020-06-06 DIAGNOSIS — G4733 Obstructive sleep apnea (adult) (pediatric): Secondary | ICD-10-CM | POA: Insufficient documentation

## 2020-06-06 DIAGNOSIS — Z6825 Body mass index (BMI) 25.0-25.9, adult: Secondary | ICD-10-CM | POA: Insufficient documentation

## 2020-06-06 DIAGNOSIS — Z79899 Other long term (current) drug therapy: Secondary | ICD-10-CM | POA: Insufficient documentation

## 2020-06-06 DIAGNOSIS — Z888 Allergy status to other drugs, medicaments and biological substances status: Secondary | ICD-10-CM | POA: Insufficient documentation

## 2020-06-06 DIAGNOSIS — J449 Chronic obstructive pulmonary disease, unspecified: Secondary | ICD-10-CM

## 2020-06-06 DIAGNOSIS — Z886 Allergy status to analgesic agent status: Secondary | ICD-10-CM | POA: Diagnosis not present

## 2020-06-06 DIAGNOSIS — E1151 Type 2 diabetes mellitus with diabetic peripheral angiopathy without gangrene: Secondary | ICD-10-CM | POA: Diagnosis not present

## 2020-06-06 DIAGNOSIS — L930 Discoid lupus erythematosus: Secondary | ICD-10-CM | POA: Diagnosis not present

## 2020-06-06 DIAGNOSIS — F17210 Nicotine dependence, cigarettes, uncomplicated: Secondary | ICD-10-CM | POA: Insufficient documentation

## 2020-06-06 DIAGNOSIS — J432 Centrilobular emphysema: Secondary | ICD-10-CM | POA: Insufficient documentation

## 2020-06-06 DIAGNOSIS — F17200 Nicotine dependence, unspecified, uncomplicated: Secondary | ICD-10-CM | POA: Diagnosis not present

## 2020-06-06 DIAGNOSIS — E663 Overweight: Secondary | ICD-10-CM | POA: Diagnosis not present

## 2020-06-06 DIAGNOSIS — Z8249 Family history of ischemic heart disease and other diseases of the circulatory system: Secondary | ICD-10-CM | POA: Diagnosis not present

## 2020-06-06 DIAGNOSIS — K635 Polyp of colon: Secondary | ICD-10-CM | POA: Insufficient documentation

## 2020-06-06 LAB — POCT GLYCOSYLATED HEMOGLOBIN (HGB A1C): HbA1c, POC (controlled diabetic range): 7.4 % — AB (ref 0.0–7.0)

## 2020-06-06 LAB — GLUCOSE, POCT (MANUAL RESULT ENTRY): POC Glucose: 139 mg/dl — AB (ref 70–99)

## 2020-06-06 MED ORDER — METFORMIN HCL ER 750 MG PO TB24: 750.0000 mg | ORAL_TABLET | Freq: Two times a day (BID) | ORAL | 5 refills | Status: DC

## 2020-06-06 MED ORDER — ALBUTEROL SULFATE (2.5 MG/3ML) 0.083% IN NEBU: INHALATION_SOLUTION | RESPIRATORY_TRACT | 0 refills | Status: DC

## 2020-06-06 NOTE — Progress Notes (Signed)
Patient ID: Stacy [name], female    DOB: 01/18/1961  MRN: [id number]  CC: Diabetes and Hypertension   Subjective: Stacy [name] is a 60 y.o. female who presents for chronic ds management Her concerns today include:  Pt with hx of HTN, DM with neuropathy,tob dep,HL,PAD,IDA,lupus,spinal stenosis with neurogenic claudication(s/p laminectomy 07/2018),COPD,OSA on CPAP,LS,OA knees,gout, recurrent sinusitis, receiving allergy shots from Dr. Faith Rogue.  Patient does not have medicines with her today.  DIABETES TYPE 2 Last A1C:   Results for orders placed or performed in visit on 06/06/20  POCT glucose (manual entry)  Result Value Ref Range   POC Glucose 139 (A) 70 - 99 mg/dl  POCT glycosylated hemoglobin (Hb A1C)  Result Value Ref Range   Hemoglobin A1C     HbA1c POC (<> result, manual entry)     HbA1c, POC (prediabetic range)     HbA1c, POC (controlled diabetic range) 7.4 (A) 0.0 - 7.0 %    Med Adherence:  _0  Yes Metformin 750 and Amaryl daily Medication side effects:  _1  Yes    _2  No.  Tolerating the long-acting metformin better. Home Monitoring?  _3  Yes    _4  No Home glucose results range:checking TID before meals.  Gives range 100-113.  She does not have the logbook with her for review. Diet Adherence: _5  Yes.  Weight has remained fairly stable in the 170s. Exercise: _6  Yes -walking daily Hypoglycemic episodes?: _7  Yes    _8  No Numbness of the feet? _9  Yes    _10  No Retinopathy hx? _11  Yes    _12  No Last eye exam: Overdue for eye exam. Comments:   HYPERTENSION Currently taking: see medication list.  Did not take med as yet for the morning. Med Adherence: _13  Yes diovan/HCTZ.  Not taking HCTZ 25 mg any more per her report.  Still taking potassium supplement.  Last potassium level was 3.7 on labs done in February. Medication side effects: _14  Yes    _15  No Adherence with salt restriction: _16  Yes    _17  No Home Monitoring?: _18  Yes    _19   No Monitoring Frequency: Q morning Home BP results range:125-130/80 or lower SOB? _20  Yes    _21  No Chest Pain?: _22  Yes    _23  No Leg swelling?: _24  Yes    _25  No Headaches?: _26  Yes    _27  No Dizziness? _28  Yes    _29  No Comments:   COPD/tob dep:  Needs RF on neb solution.  Using Symbicort and Spiriva as prescribed.  No recent flare in COPD. Still at 3 cigarettes a day.  Wants to quit but too stress at this time.  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  Using steroid cream on scalp   Patient Active Problem List   Diagnosis Date Noted  . Colon polyps 06/06/2020  . Lupus ([place]) 06/06/2020  . Allergic rhinitis due to animal (cat) (dog) hair and dander 04/08/2020  . Allergic rhinitis due to pollen 04/08/2020  . Food allergy 04/08/2020  . Acute medial meniscus tear, left, subsequent encounter 03/21/2020  . Chronic pain of left knee 02/15/2020  . Paresthesia of skin 11/16/2019  . History of total knee replacement, right 11/16/2019  . Tobacco abuse 11/16/2019  . Centrilobular emphysema ([place]) 08/10/2019  . Incidental lung nodule, > 79m and < 893m07/09/2019  . OSA on CPAP 08/10/2019  . Dyspnea on exertion 08/02/2019  . Hyperlipidemia 08/02/2019  . Lumbar radiculopathy 04/24/2019  . Status post total replacement  of left hip 03/14/2019  . Post laminectomy syndrome 02/23/2019  . Abnormality of gait 02/23/2019  . 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 Unilateral primary osteoarthritis, left hip 12/28/2018  . Lesion of skin of left ear 12/26/2018  . Primary osteoarthritis of left hip 12/02/2018  . Iron deficiency anemia 10/16/2018  . Chronic pain syndrome 09/08/2018  . Chronic pain of right knee 08/11/2018  . Status post lumbar laminectomy 07/15/2018  . Peripheral arterial disease ([place]) 04/05/2018  . Moderate persistent asthma without complication 13/09/6576  . Environmental and seasonal allergies 06/29/2017  . Controlled type 2 diabetes mellitus with diabetic polyneuropathy,  without long-term current use of insulin ([place]) 06/29/2017  . Perennial allergic rhinitis 04/08/2017  . Sensorineural hearing loss (SNHL), bilateral 04/08/2017  . Chronic pansinusitis 03/25/2017  . Eustachian tube dysfunction, bilateral 03/25/2017  . Lichen planopilaris 46/96/2952  . Herniation of lumbar intervertebral disc with radiculopathy 10/02/2016    Class: Chronic  . Alopecia areata 08/19/2016  . Chondromalacia of both patellae 06/03/2015    Class: Chronic  . Spinal stenosis, lumbar region, with neurogenic claudication 06/03/2015  . Tobacco use disorder 04/25/2015  . DJD (degenerative joint disease) of knee 01/04/2015  . Hemorrhoid 11/14/2014  . Gout of big toe 07/19/2014  . Essential hypertension 08/14/2013  . Gastroesophageal reflux disease without esophagitis 08/14/2013  . COPD (chronic obstructive pulmonary disease) ([place]) 04/17/2011     Current Outpatient Medications on File Prior to Visit  Medication Sig Dispense Refill  . ACCU-CHEK AVIVA PLUS test strip USE 3 TIMES A DAY AS DIRECTED BY PHYSICIAN 100 each 12  . Accu-Chek Softclix Lancets lancets USE AS INSTRUCTED 3 TIMES A DAY 100 each 12  . albuterol (PROVENTIL) (2.5 MG/3ML) 0.083% nebulizer solution USE ONE VIAL (2.5 MG TOTAL) BY NEBULIZATION EVERY 6 (SIX) HOURS AS NEEDED FOR WHEEZING. 300 mL 0  . ALLERGY RELIEF 10 MG tablet TAKE 1 TABLET (10 MG TOTAL) BY MOUTH DAILY. 30 tablet 11  . allopurinol (ZYLOPRIM) 100 MG tablet TAKE 1 TABLET (100 MG TOTAL) BY MOUTH DAILY. 90 tablet 3  . atorvastatin (LIPITOR) 20 MG tablet TAKE 1 TABLET (20 MG TOTAL) BY MOUTH DAILY. 90 tablet 2  . Blood Glucose Monitoring Suppl (ACCU-CHEK AVIVA PLUS) w/Device KIT 1 each by Does not apply route 3 (three) times daily. 1 kit 0  . budesonide-formoterol (SYMBICORT) 80-4.5 MCG/ACT inhaler INHALE TWO PUFFS BY MOUTH TWICE A DAY (Patient taking differently: Inhale 2 puffs into the lungs 2 (two) times a day.) 1 Inhaler 11  . celecoxib (CELEBREX) 200 MG  capsule Take 200 mg by mouth 2 (two) times daily.    . cetirizine (ZYRTEC) 10 MG tablet 1 tablet    . Cholecalciferol 100000 UNIT/GM POWD 1 capsule    . clopidogrel (PLAVIX) 75 MG tablet TAKE 1 TABLET (75 MG TOTAL) BY MOUTH DAILY. 90 tablet 1  . diclofenac Sodium (VOLTAREN) 1 % GEL APPLY 4 GRAMS TOPICALLY 4 (FOUR) TIMES DAILY. 400 g 6  . dicyclomine (BENTYL) 20 MG tablet TK 1 T PO  QID BEFORE MEALS AND HS FOR 10 DAYS (Patient not taking: Reported on 06/06/2020)    . DULoxetine (CYMBALTA) 60 MG capsule Take 1 capsule (60 mg total) by mouth daily. 30 capsule 3  . EPINEPHrine 0.3 mg/0.3 mL IJ SOAJ injection 0.3 mg IM x 1 PRN for allergic reaction (Patient taking differently: Inject 0.3 mg into the muscle once as needed for anaphylaxis.) 1 Device 1  . [name] 325 (65 Fe) MG tablet TAKE 1  TABLET BY MOUTH 2 (TWO) TIMES DAILY WITH A MEAL. 60 tablet 0  . FLOWFLEX COVID-19 AG HOME TEST KIT See admin instructions.    . fluocinonide (LIDEX) 0.05 % external solution 1 application to affected area (Patient not taking: Reported on 06/06/2020)    . fluticasone (FLONASE) 50 MCG/ACT nasal spray Place 1 spray into both nostrils daily. 16 g 6  . gabapentin (NEURONTIN) 600 MG tablet TAKE ONE TABLET BY MOUTH THREE TIMES A DAY. 90 tablet 1  . glimepiride (AMARYL) 2 MG tablet TAKE 1 TABLET (2 MG TOTAL) BY MOUTH DAILY. 90 tablet 1  . GOLYTELY 236 g solution Take by mouth as directed.    Marland Kitchen HYDROcodone-acetaminophen (NORCO/VICODIN) 5-325 MG tablet Take 1-2 tablets by mouth every 6 (six) hours as needed for moderate pain. 30 tablet 0  . hydrOXYzine (ATARAX/VISTARIL) 10 MG tablet TAKE 1 TABLET IN THE MORNING AND NOON AND 2 TABLETS IN THE EVENING AS NEEDED 120 tablet 2  . Lancets (ACCU-CHEK SOFT TOUCH) lancets Use as instructed 100 each 12  . Melatonin 3 MG TABS Take 1 tablet (3 mg total) by mouth at bedtime as needed. (Patient taking differently: Take 3 mg by mouth at bedtime as needed (sleep).) 30 tablet 3  . methocarbamol  (ROBAXIN) 750 MG tablet TAKE 1 TABLET (750 MG TOTAL) BY MOUTH EVERY 12 (TWELVE) HOURS AS NEEDED FOR MUSCLE SPASMS. 40 tablet 1  . montelukast (SINGULAIR) 10 MG tablet Take 10 mg by mouth at bedtime.     . Multiple Vitamin (MULTIVITAMIN WITH MINERALS) TABS tablet Take 1 tablet by mouth daily. CENTRUM SILVER 50 plus    . mupirocin ointment (BACTROBAN) 2 % Apply 1 application topically 3 (three) times daily.    . nicotine (NICODERM CQ - DOSED IN MG/24 HOURS) 14 mg/24hr patch PLACE 1 PATCH ONTO THE SKIN DAILY 28 patch 1  . ondansetron (ZOFRAN ODT) 4 MG disintegrating tablet Take 1 tablet (4 mg total) by mouth every 8 (eight) hours as needed for nausea or vomiting. 20 tablet 0  . ondansetron (ZOFRAN) 4 MG tablet Take 4 mg by mouth 2 (two) times daily as needed.    . pantoprazole (PROTONIX) 40 MG tablet Take 1 tablet (40 mg total) by mouth daily. 30 tablet 2  . polyethylene glycol (MIRALAX / GLYCOLAX) 17 g packet     . potassium chloride SA (KLOR-CON) 20 MEQ tablet TAKE ONE TABLET BY MOUTH ONCE DAILY 90 tablet 1  . promethazine (PHENERGAN) 25 MG tablet TK 1 T PO  Q 6 H PRN FOR NAUSEA OR VOMITING    . Psyllium (METAMUCIL) 0.36 g CAPS     . Respiratory Therapy Supplies (FLUTTER) DEVI Use after breathing treatment 4 times daily 1 each 0  . senna-docusate (SENOKOT S) 8.6-50 MG tablet Take 1 tablet by mouth daily as needed for mild constipation. 30 tablet 2  . simvastatin (ZOCOR) 20 MG tablet TAKE 1 TABLET (20 MG TOTAL) BY MOUTH AT BEDTIME.    Marland Kitchen SPIRIVA HANDIHALER 18 MCG inhalation capsule PLACE 1 CAPSULE (18 MCG TOTAL) INTO INHALER AND INHALE DAILY. 30 capsule 2  . sucralfate (CARAFATE) 1 g tablet Take 1 g by mouth 4 (four) times daily -  with meals and at bedtime.    . triamcinolone cream (KENALOG) 0.1 % Apply 1 application topically 2 (two) times daily as needed (lupus rash).     . valsartan-hydrochlorothiazide (DIOVAN-HCT) 160-12.5 MG tablet TAKE ONE TABLET BY MOUTH ONCE DAILY 90 tablet 1  .  [DISCONTINUED]  Fluticasone-Salmeterol (ADVAIR) 500-50 MCG/DOSE AEPB Inhale 1 puff into the lungs every 12 (twelve) hours.      . [DISCONTINUED] lisinopril (PRINIVIL,ZESTRIL) 40 MG tablet Take 40 mg by mouth daily.       No current facility-administered medications on file prior to visit.    Allergies  Allergen Reactions  . Other Shortness Of Breath    UNSPECIFIED AGENTS Allergic to perfumes and cleaning products  . Shellfish Allergy Anaphylaxis    Per allergy test.  . Ace Inhibitors Cough and Other (See Comments)       . Celecoxib     Other reaction(s): upset stomach  . Shellfish-Derived Products Other (See Comments)  . Aspirin Nausea Only    Other reaction(s): stomach upset Other reaction(s): Unknown    Social History   Socioeconomic History  . Marital status: Married    Spouse name: Not on file  . Number of children: 2  . Years of education: Not on file  . Highest education level: Not on file  Occupational History  . Occupation: unemployed    Fish farm manager: UNEMPLOYED  Tobacco Use  . Smoking status: Current Some Day Smoker    Packs/day: 0.25    Years: 32.00    Pack years: 8.00    Types: Cigarettes  . Smokeless tobacco: Never Used  . Tobacco comment: DOWN TO 3 PER DAY  Vaping Use  . Vaping Use: Never used  Substance and Sexual Activity  . Alcohol use: No  . Drug use: No  . Sexual activity: Yes    Birth control/protection: Surgical, Post-menopausal    Comment: tubal ligation  Other Topics Concern  . Not on file  Social History Narrative  . Not on file   Social Determinants of Health   Financial Resource Strain: Not on file  Food Insecurity: Not on file  Transportation Needs: Not on file  Physical Activity: Not on file  Stress: Not on file  Social Connections: Not on file  Intimate Partner Violence: Not on file    Family History  Problem Relation Age of Onset  . Hypertension Father   . Cancer Father   . Heart disease Mother   . Asthma Son        had  as a child  . Heart disease Sister   . Breast cancer Sister   . Hypertension Brother     Past Surgical History:  Procedure Laterality Date  . BACK SURGERY    . CHOLECYSTECTOMY    . COLONOSCOPY    . ENDOMETRIAL ABLATION  10/2010  . EYE SURGERY    . HERNIA REPAIR     umbicial hernia  . JOINT REPLACEMENT Left 03/14/2019   Dr. Ninfa Linden hip  . KNEE ARTHROSCOPY Left    06/07/2017 Dr. Marlou Sa of [facility]  . KNEE ARTHROSCOPY Left 03/21/2020   Procedure: LEFT KNEE ARTHROSCOPY WITH PARTIAL MEDIAL MENISCECTOMY;  Surgeon: Mcarthur Rossetti, MD;  Location: [facility];  Service: Orthopedics;  Laterality: Left;  . KNEE CLOSED REDUCTION Right 12/06/2015   Procedure: CLOSED MANIPULATION RIGHT KNEE;  Surgeon: [name], MD;  Location: [facility];  Service: Orthopedics;  Laterality: Right;  . KNEE CLOSED REDUCTION Right 01/17/2016   Procedure: CLOSED MANIPULATION RIGHT KNEE;  Surgeon: [name], MD;  Location: [facility];  Service: Orthopedics;  Laterality: Right;  . KNEE JOINT MANIPULATION Right 12/06/2015  . LACRIMAL TUBE INSERTION Bilateral 03/01/2019   Procedure: LACRIMAL TUBE INSERTION;  Surgeon: Gevena Cotton, MD;  Location: [facility];  Service: Ophthalmology;  Laterality: Bilateral;  . LACRIMAL TUBE REMOVAL Bilateral 05/03/2019   Procedure: BILATERAL NASOLACRIMAL DUCT PROBING, IIRIGATION AND TUBE REMOVAL BOTH EYES;  Surgeon: Gevena Cotton, MD;  Location: [facility];  Service: Ophthalmology;  Laterality: Bilateral;  . LUMBAR DISC SURGERY  06/03/2015   L 2  L3 L4 L5   . LUMBAR LAMINECTOMY/DECOMPRESSION MICRODISCECTOMY N/A 06/03/2015   Procedure: Bilateral lateral recess decompression L2-3, L3-4, L4-5;  Surgeon: [name], MD;  Location: [facility];  Service: Orthopedics;  Laterality: N/A;  . LUMBAR LAMINECTOMY/DECOMPRESSION MICRODISCECTOMY N/A 10/02/2016   Procedure: Right L5-S1 Lateral Recess Decompression  microdiscectomy;  Surgeon: [name], MD;  Location: [facility];  Service: Orthopedics;  Laterality: N/A;  . LUMBAR LAMINECTOMY/DECOMPRESSION MICRODISCECTOMY N/A 07/15/2018   Procedure: LEFT L3-4 MICRODISCECTOMY;  Surgeon: [name], MD;  Location: [place];  Service: Orthopedics;  Laterality: N/A;  . svd      x 2  . TEAR DUCT PROBING Bilateral 03/01/2019   Procedure: TEAR DUCT PROBING WITH IRRIGATION;  Surgeon: Gevena Cotton, MD;  Location[facility];  Service: Ophthalmology;  Laterality: Bilateral;  . TOTAL HIP ARTHROPLASTY Left 03/14/2019   Procedure: LEFT TOTAL HIP ARTHROPLASTY ANTERIOR APPROACH;  Surgeon: Mcarthur Rossetti, MD;  Location: [facility];  Service: Orthopedics;  Laterality: Left;  . TOTAL KNEE ARTHROPLASTY Right 09/06/2015   Procedure: RIGHT TOTAL KNEE ARTHROPLASTY;  Surgeon: [name], MD;  Location: [facility];  Service: Orthopedics;  Laterality: Right;  . TUBAL LIGATION    . UPPER GASTROINTESTINAL ENDOSCOPY  04/28/11    ROS: Review of Systems Negative except as stated above  PHYSICAL EXAM: BP 120/84   Pulse 77   Resp 16   Wt 179 lb 12.8 oz (81.6 kg)   LMP 09/01/2010   SpO2 96%   BMI 29.02 kg/m   Wt Readings from Last 3 Encounters:  06/06/20 179 lb 12.8 oz (81.6 kg)  05/23/20 176 lb (79.8 kg)  04/15/20 176 lb (79.8 kg)    Physical Exam  General appearance - alert, well appearing, older African-American female and in no distress Mental status - normal mood, behavior, speech, dress, motor activity, and thought processes Nose - normal and patent, no erythema, discharge or polyps Mouth - mucous membranes moist, pharynx normal without lesions Neck - supple, no significant adenopathy Chest -breath sounds slightly decreased but clear bilaterally without wheezes or crackles at this time. Heart - normal rate, regular rhythm, normal S1, S2, no murmurs, rubs, clicks or gallops Extremities -no lower extremity edema. Skin -about a 4 cm area on the crown of the head that demonstrates mild  scarring and hypopigmentation Diabetic Foot Exam - Simple   Simple Foot Form Visual Inspection See comments: Yes Sensation Testing See comments: Yes Pulse Check Posterior Tibialis and Dorsalis pulse intact bilaterally: Yes Comments Toenails are thickened discolored.  Decreased sensation on the exam on the plantar surface of both feet.      CMP Latest Ref Rng & Units 03/18/2020 08/02/2019 05/03/2019  Glucose 70 - 99 mg/dL 169(H) - 125(H)  BUN 6 - 20 mg/dL 7 - 11  Creatinine 0.44 - 1.00 mg/dL 0.74 - 0.72  Sodium 135 - 145 mmol/L 138 - 137  Potassium 3.5 - 5.1 mmol/L 3.7 - 3.5  Chloride 98 - 111 mmol/L 100 - 103  CO2 22 - 32 mmol/L 26 - 25  Calcium 8.9 - 10.3 mg/dL 9.1 - 9.2  Total Protein 6.0 - 8.5 g/dL -  7.3 -  Total Bilirubin 0.0 - 1.2 mg/dL - 0.2 -  Alkaline Phos 48 - 121 IU/L - 129(H) -  AST 0 - 40 IU/L - 17 -  ALT 0 - 32 IU/L - 12 -   Lipid Panel     Component Value Date/Time   CHOL 160 01/17/2020 0817   TRIG 191 (H) 01/17/2020 0817   HDL 43 01/17/2020 0817   CHOLHDL 3.7 01/17/2020 0817   CHOLHDL 4.2 07/19/2014 1202   VLDL 24 07/19/2014 1202   LDLCALC 85 01/17/2020 0817    CBC    Component Value Date/Time   WBC 7.1 01/15/2020 1133   WBC 8.0 03/27/2019 1441   RBC 4.36 01/15/2020 1133   RBC 3.79 (L) 03/27/2019 1441   HGB 12.2 01/15/2020 1133   HCT 36.4 01/15/2020 1133   PLT 295 01/15/2020 1133   MCV 84 01/15/2020 1133   MCH 28.0 01/15/2020 1133   MCH 27.7 03/27/2019 1441   MCHC 33.5 01/15/2020 1133   MCHC 31.8 03/27/2019 1441   RDW 13.5 01/15/2020 1133   LYMPHSABS 4.1 (H) 10/14/2018 1704   MONOABS 0.5 03/18/2018 0252   EOSABS 0.1 10/14/2018 1704   BASOSABS 0.0 10/14/2018 1704    ASSESSMENT AND PLAN: 1. Type 2 diabetes mellitus with diabetic polyneuropathy, without long-term current use of insulin ([place]) Reported blood sugars are at goal but A1c above goal.  Recommend increasing metformin to twice a day.  Continue low-dose Amaryl.  Continue healthy eating  habits and regular exercise. - POCT glucose (manual entry) - POCT glycosylated hemoglobin (Hb A1C) - Ambulatory referral to Ophthalmology - metFORMIN (GLUCOPHAGE-XR) 750 MG 24 hr tablet; Take 1 tablet (750 mg total) by mouth 2 (two) times daily.  Dispense: 60 tablet; Refill: 5  2. Essential hypertension Close to goal.  Continue Diovan/HCTZ.  3. Centrilobular emphysema ([place]) Controlled on current inhalers.  4. Tobacco use disorder Advised to quit smoking.  She is aware of health risks associated with smoking.  She is not ready to give a trial of quitting.  5. Overweight (BMI 25.0-29.9) See #1 above  6. 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  Area on scalp has not gotten any worse.  She plans to follow-up with a dermatologist.     Patient was given the opportunity to ask questions.  Patient verbalized understanding of the plan and was able to repeat key elements of the plan.   Orders Placed This Encounter  Procedures  . Ambulatory referral to Ophthalmology  . POCT glucose (manual entry)  . POCT glycosylated hemoglobin (Hb A1C)     Requested Prescriptions   Signed Prescriptions Disp Refills  . metFORMIN (GLUCOPHAGE-XR) 750 MG 24 hr tablet 60 tablet 5    Sig: Take 1 tablet (750 mg total) by mouth 2 (two) times daily.    Return in about 4 months (around 10/07/2020).  Karle Plumber, MD, FACP

## 2020-06-06 NOTE — Patient Instructions (Signed)
Please remember to bring your medications with you on your next visit.  Your A1c is not at goal.  We will request for metformin to 750 mg twice a day.  Continue glimepiride.

## 2020-06-06 NOTE — Telephone Encounter (Signed)
Copied from CRM [id number]. Topic: Quick Communication - Rx Refill/Question >> Jun 06, 2020  1:57 PM Gaetana Michaelis A wrote: Medication: albuterol (PROVENTIL) (2.5 MG/3ML) 0.083% nebulizer solution   Has the patient contacted their pharmacy? Yes. Patient has contacted their pharmacy and been directed to contact their PCP  Preferred Pharmacy (with phone number or street name): [facility] - [place], [place] - [address]  Phone:  [phone] Fax:  [phone]  Agent: Please be advised that RX refills may take up to 3 business days. We ask that you follow-up with your pharmacy.

## 2020-06-07 ENCOUNTER — Ambulatory Visit: Payer: Self-pay | Admitting: *Deleted

## 2020-06-07 MED ORDER — LOPERAMIDE HCL 2 MG PO TABS: 2.0000 mg | ORAL_TABLET | Freq: Four times a day (QID) | ORAL | 0 refills | Status: DC | PRN

## 2020-06-07 NOTE — Telephone Encounter (Signed)
Will forward to provider  

## 2020-06-07 NOTE — Telephone Encounter (Signed)
Summary: Please advise   Pt seen dr Stacy [name] yesterday and was not have this problem. Pt woke up today with nausea, and diarrhea. Pt has IBS please advise      Declines ED- patient was seen in office yesterday. Patient states she has a history of IBS and she thinks she is having a flare- she states she can have up to 7 days of symptoms during flare. Patient has not used any antidiarrhea medication- she states she does not have any. Patient does not have medication for nausea/vomiting. Advised her to contact her GI specialist for her symptoms- due to her history and they treat her for this- but will send note to office to see if PCP can help her with her symptoms- certainly do not want her to get in trouble with her diabetes from not eating/drinking. Advised if she gets worse- she needs to go to ED. Reason for Disposition . [1] SEVERE abdominal pain AND [2] age > 60 years  Answer Assessment - Initial Assessment Questions 1. DIARRHEA SEVERITY: "How bad is the diarrhea?" "How many more stools have you had in the past 24 hours than normal?"    - NO DIARRHEA (SCALE 0)   - MILD (SCALE 1-3): Few loose or mushy BMs; increase of 1-3 stools over normal daily number of stools; mild increase in ostomy output.   -  MODERATE (SCALE 4-7): Increase of 4-6 stools daily over normal; moderate increase in ostomy output. * SEVERE (SCALE 8-10; OR 'WORST POSSIBLE'): Increase of 7 or more stools daily over normal; moderate increase in ostomy output; incontinence.     severe 2. ONSET: "When did the diarrhea begin?"      This morning 3. BM CONSISTENCY: "How loose or watery is the diarrhea?"      watery 4. VOMITING: "Are you also vomiting?" If Yes, ask: "How many times in the past 24 hours?"      Vomiting- this morning 5. ABDOMINAL PAIN: "Are you having any abdominal pain?" If Yes, ask: "What does it feel like?" (e.g., crampy, dull, intermittent, constant)      Yes- crampy comes and goes 6. ABDOMINAL PAIN SEVERITY: If  present, ask: "How bad is the pain?"  (e.g., Scale 1-10; mild, moderate, or severe)   - MILD (1-3): doesn't interfere with normal activities, abdomen soft and not tender to touch    - MODERATE (4-7): interferes with normal activities or awakens from sleep, abdomen tender to touch    - SEVERE (8-10): excruciating pain, doubled over, unable to do any normal activities       9 7. ORAL INTAKE: If vomiting, "Have you been able to drink liquids?" "How much liquids have you had in the past 24 hours?"     Tried cracker and ginger ale this morning- did not stay down 8. HYDRATION: "Any signs of dehydration?" (e.g., dry mouth [not just dry lips], too weak to stand, dizziness, new weight loss) "When did you last urinate?"     no 9. EXPOSURE: "Have you traveled to a foreign country recently?" "Have you been exposed to anyone with diarrhea?" "Could you have eaten any food that was spoiled?"     no 10. ANTIBIOTIC USE: "Are you taking antibiotics now or have you taken antibiotics in the past 2 months?"       no 11. OTHER SYMPTOMS: "Do you have any other symptoms?" (e.g., fever, blood in stool)       no 12. PREGNANCY: "Is there any chance you are pregnant?" "When  was your last menstrual period?"       n/a  Protocols used: DIARRHEA-A-AH

## 2020-06-07 NOTE — Addendum Note (Signed)
Addended by: Jonah Blue B on: 06/07/2020 05:16 PM   Modules accepted: Orders

## 2020-06-07 NOTE — Telephone Encounter (Signed)
Please advise if appropriate.

## 2020-06-07 NOTE — Telephone Encounter (Signed)
Contacted pt to go over provider response pt is aware and doesn't have any questions or concerns  

## 2020-06-10 DIAGNOSIS — R197 Diarrhea, unspecified: Secondary | ICD-10-CM | POA: Diagnosis not present

## 2020-06-10 DIAGNOSIS — K3184 Gastroparesis: Secondary | ICD-10-CM | POA: Diagnosis not present

## 2020-06-10 DIAGNOSIS — R112 Nausea with vomiting, unspecified: Secondary | ICD-10-CM | POA: Diagnosis not present

## 2020-06-10 DIAGNOSIS — K2970 Gastritis, unspecified, without bleeding: Secondary | ICD-10-CM | POA: Diagnosis not present

## 2020-06-10 DIAGNOSIS — R109 Unspecified abdominal pain: Secondary | ICD-10-CM | POA: Diagnosis not present

## 2020-06-10 DIAGNOSIS — J3081 Allergic rhinitis due to animal (cat) (dog) hair and dander: Secondary | ICD-10-CM | POA: Diagnosis not present

## 2020-06-10 DIAGNOSIS — J3089 Other allergic rhinitis: Secondary | ICD-10-CM | POA: Diagnosis not present

## 2020-06-10 DIAGNOSIS — K589 Irritable bowel syndrome without diarrhea: Secondary | ICD-10-CM | POA: Diagnosis not present

## 2020-06-10 DIAGNOSIS — K2100 Gastro-esophageal reflux disease with esophagitis, without bleeding: Secondary | ICD-10-CM | POA: Diagnosis not present

## 2020-06-10 DIAGNOSIS — J301 Allergic rhinitis due to pollen: Secondary | ICD-10-CM | POA: Diagnosis not present

## 2020-06-12 DIAGNOSIS — F331 Major depressive disorder, recurrent, moderate: Secondary | ICD-10-CM | POA: Diagnosis not present

## 2020-06-14 ENCOUNTER — Other Ambulatory Visit: Payer: Self-pay | Admitting: Internal Medicine

## 2020-06-14 NOTE — Telephone Encounter (Signed)
   Notes to clinic:Short supply of medication was given on 06/07/2020 Review for continued use and another refill    Requested Prescriptions  Pending Prescriptions Disp Refills   loperamide (IMODIUM) 2 MG capsule [Pharmacy Med Name: LOPERAMIDE HCL 2 MG ORAL CAPSULE] 15 capsule     Sig: TAKE 1 CAPSULE (2 MG TOTAL) BY MOUTH 4 (FOUR) TIMES DAILY AS NEEDED FOR DIARRHEA OR LOOSE STOOLS.      Over the Counter:  OTC Passed - 06/14/2020  1:11 PM      Passed - Valid encounter within last 12 months    Recent Outpatient Visits           1 week ago Type 2 diabetes mellitus with diabetic polyneuropathy, without long-term current use of insulin (HCC)   [facility] [place], Gavin Pound B, MD   5 months ago Type 2 diabetes mellitus with diabetic polyneuropathy, without long-term current use of insulin (HCC)   [place] [facility] Marcine Matar, MD   6 months ago Need for influenza vaccination   [facility] Drucilla Chalet, RPH-CPP   10 months ago Type 2 diabetes mellitus with diabetic polyneuropathy, without long-term current use of insulin [facility])   [place] [facility] Marcine Matar, MD   1 year ago Coarse tremors   [place] [facility] Marcine Matar, MD       Future Appointments             In 1 month Glendale Chard, DO [place] [facility] [place]   In 1 month [name], Guy Sandifer, MD [facility]

## 2020-06-17 DIAGNOSIS — J3089 Other allergic rhinitis: Secondary | ICD-10-CM | POA: Diagnosis not present

## 2020-06-17 DIAGNOSIS — J3081 Allergic rhinitis due to animal (cat) (dog) hair and dander: Secondary | ICD-10-CM | POA: Diagnosis not present

## 2020-06-17 DIAGNOSIS — J301 Allergic rhinitis due to pollen: Secondary | ICD-10-CM | POA: Diagnosis not present

## 2020-06-18 DIAGNOSIS — E871 Hypo-osmolality and hyponatremia: Secondary | ICD-10-CM | POA: Diagnosis not present

## 2020-06-19 DIAGNOSIS — F331 Major depressive disorder, recurrent, moderate: Secondary | ICD-10-CM | POA: Diagnosis not present

## 2020-06-20 DIAGNOSIS — F331 Major depressive disorder, recurrent, moderate: Secondary | ICD-10-CM | POA: Diagnosis not present

## 2020-06-24 DIAGNOSIS — J301 Allergic rhinitis due to pollen: Secondary | ICD-10-CM | POA: Diagnosis not present

## 2020-06-24 DIAGNOSIS — J3089 Other allergic rhinitis: Secondary | ICD-10-CM | POA: Diagnosis not present

## 2020-06-24 DIAGNOSIS — J3081 Allergic rhinitis due to animal (cat) (dog) hair and dander: Secondary | ICD-10-CM | POA: Diagnosis not present

## 2020-06-25 ENCOUNTER — Other Ambulatory Visit: Payer: Self-pay | Admitting: Internal Medicine

## 2020-06-25 ENCOUNTER — Other Ambulatory Visit: Payer: Self-pay | Admitting: Physical Medicine & Rehabilitation

## 2020-06-25 DIAGNOSIS — F17200 Nicotine dependence, unspecified, uncomplicated: Secondary | ICD-10-CM

## 2020-06-25 NOTE — Telephone Encounter (Signed)
Requested medication (s) are due for refill today: yes  Requested medication (s) are on the active medication list: yes  Last refill:  05/23/20  Future visit scheduled: no  Notes to clinic:  would Dr Laural Benes like for the pt to continue taking this medication  Requested Prescriptions  Pending Prescriptions Disp Refills   nicotine (NICODERM CQ - DOSED IN MG/24 HOURS) 14 mg/24hr patch [Pharmacy Med Name: NICOTINE 14 MG/24HR TRANSDERMAL PATCH 24 HOUR] 28 patch 0    Sig: PLACE 1 PATCH ONTO THE SKIN DAILY      Psychiatry:  Drug Dependence Therapy Passed - 06/25/2020 12:50 PM      Passed - Valid encounter within last 12 months    Recent Outpatient Visits           2 weeks ago Type 2 diabetes mellitus with diabetic polyneuropathy, without long-term current use of insulin (HCC)   [place] [facility] [place], Gavin Pound B, MD   5 months ago Type 2 diabetes mellitus with diabetic polyneuropathy, without long-term current use of insulin (HCC)   [place] [facility] Marcine Matar, MD   7 months ago Need for influenza vaccination   [facility] [place], Cornelius Moras, RPH-CPP   10 months ago Type 2 diabetes mellitus with diabetic polyneuropathy, without long-term current use of insulin (HCC)   [place] [facility] Marcine Matar, MD   1 year ago Coarse tremors   [place] [facility] Marcine Matar, MD       Future Appointments             In 1 month Glendale Chard, DO [place] [facility] [place]   In 1 month Kerrin Champagne, MD [facility]              Signed Prescriptions Disp Refills   SPIRIVA HANDIHALER 18 MCG inhalation capsule 30 capsule 2    Sig: PLACE 1 CAPSULE (18 MCG TOTAL) INTO INHALER AND INHALE DAILY.      Pulmonology:  Anticholinergic Agents Passed - 06/25/2020 12:50 PM      Passed - Valid encounter within last 12 months    Recent  Outpatient Visits           2 weeks ago Type 2 diabetes mellitus with diabetic polyneuropathy, without long-term current use of insulin (HCC)   [place] [facility] [place], Gavin Pound B, MD   5 months ago Type 2 diabetes mellitus with diabetic polyneuropathy, without long-term current use of insulin (HCC)   [place] [facility] Marcine Matar, MD   7 months ago Need for influenza vaccination   [facility] Drucilla Chalet, RPH-CPP   10 months ago Type 2 diabetes mellitus with diabetic polyneuropathy, without long-term current use of insulin [facility])   [facility] Marcine Matar, MD   1 year ago Coarse tremors    [facility] Marcine Matar, MD       Future Appointments             In 1 month Glendale Chard, DO [place] [facility] [place]   In 1 month [name] Guy Sandifer, MD [facility]              Refused Prescriptions Disp Refills   hydrochlorothiazide (HYDRODIURIL) 25 MG tablet [Pharmacy Med Name:  HYDROCHLOROTHIAZIDE 25 MG ORAL TABLET] 90 tablet     Sig: TAKE 1 TABLET (25 MG TOTAL) BY MOUTH DAILY. (AM)      Cardiovascular: Diuretics - Thiazide Passed - 06/25/2020 12:50 PM      Passed - Ca in normal range and within 360 days    Calcium  Date Value Ref Range Status  03/18/2020 9.1 8.9 - 10.3 mg/dL Final   Calcium, Ion  Date Value Ref Range Status  03/01/2019 1.22 1.15 - 1.40 mmol/L Final          Passed - Cr in normal range and within 360 days    Creat  Date Value Ref Range Status  03/11/2016 0.90 0.50 - 1.05 mg/dL Final    Comment:      For patients > or = 61 years of age: The upper reference limit for Creatinine is approximately 13% higher for people identified as African-American.      Creatinine, Ser  Date Value Ref Range Status  03/18/2020 0.74 0.44 - 1.00 mg/dL Final   Creatinine, POC  Date Value  Ref Range Status  06/10/2016 200 mg/dL Final          Passed - K in normal range and within 360 days    Potassium  Date Value Ref Range Status  03/18/2020 3.7 3.5 - 5.1 mmol/L Final          Passed - Na in normal range and within 360 days    Sodium  Date Value Ref Range Status  03/18/2020 138 135 - 145 mmol/L Final  03/02/2019 141 134 - 144 mmol/L Final          Passed - Last BP in normal range    BP Readings from Last 1 Encounters:  06/06/20 120/84          Passed - Valid encounter within last 6 months    Recent Outpatient Visits           2 weeks ago Type 2 diabetes mellitus with diabetic polyneuropathy, without long-term current use of insulin (HCC)   [place] [facility] [place], Gavin Pound B, MD   5 months ago Type 2 diabetes mellitus with diabetic polyneuropathy, without long-term current use of insulin (HCC)   [place] [facility] Marcine Matar, MD   7 months ago Need for influenza vaccination   [facility] [place], Cornelius Moras, RPH-CPP   10 months ago Type 2 diabetes mellitus with diabetic polyneuropathy, without long-term current use of insulin [facility])   [facility] Marcine Matar, MD   1 year ago Coarse tremors   [place] [facility] Marcine Matar, MD       Future Appointments             In 1 month Glendale Chard, DO [place] [facility] [place]   In 1 month [name], Guy Sandifer, MD [facility]

## 2020-06-25 NOTE — Telephone Encounter (Signed)
Requested Prescriptions  Pending Prescriptions Disp Refills  . nicotine (NICODERM CQ - DOSED IN MG/24 HOURS) 14 mg/24hr patch [Pharmacy Med Name: NICOTINE 14 MG/24HR TRANSDERMAL PATCH 24 HOUR] 28 patch 0    Sig: PLACE 1 PATCH ONTO THE SKIN DAILY     Psychiatry:  Drug Dependence Therapy Passed - 06/25/2020 12:50 PM      Passed - Valid encounter within last 12 months    Recent Outpatient Visits          2 weeks ago Type 2 diabetes mellitus with diabetic polyneuropathy, without long-term current use of insulin (HCC)   [place] [facility] [place], Gavin Pound B, MD   5 months ago Type 2 diabetes mellitus with diabetic polyneuropathy, without long-term current use of insulin (HCC)   [place] [facility] Marcine Matar, MD   7 months ago Need for influenza vaccination   [facility] Well[facility] Drucilla Chalet, RPH-CPP   10 months ago Type 2 diabetes mellitus with diabetic polyneuropathy, without long-term current use of insulin [facility])   [place] [facility] Marcine Matar, MD   1 year ago Coarse tremors   [place] [facility] Marcine Matar, MD      Future Appointments            In 1 month Allena Katz, Noberto Retort, DO [place] [facility] [place]   In 1 month [name], Guy Sandifer, MD [facility]           . SPIRIVA HANDIHALER 18 MCG inhalation capsule [Pharmacy Med Name: SPIRIVA HANDIHALER 18 MCG INHALATION CAPSULE] 30 capsule 2    Sig: PLACE 1 CAPSULE (18 MCG TOTAL) INTO INHALER AND INHALE DAILY.     Pulmonology:  Anticholinergic Agents Passed - 06/25/2020 12:50 PM      Passed - Valid encounter within last 12 months    Recent Outpatient Visits          2 weeks ago Type 2 diabetes mellitus with diabetic polyneuropathy, without long-term current use of insulin (HCC)   [place] [facility] [place], Gavin Pound B, MD   5 months ago Type 2 diabetes  mellitus with diabetic polyneuropathy, without long-term current use of insulin (HCC)   [place] [facility] Marcine Matar, MD   7 months ago Need for influenza vaccination   [facility] Drucilla Chalet, RPH-CPP   10 months ago Type 2 diabetes mellitus with diabetic polyneuropathy, without long-term current use of insulin (HCC)   [place] [facility] Marcine Matar, MD   1 year ago Coarse tremors   [place] [facility] Marcine Matar, MD      Future Appointments            In 1 month Glendale Chard, DO [place] [facility] [place]   In 1 month Kerrin Champagne, MD [facility] [place]           . hydrochlorothiazide (HYDRODIURIL) 25 MG tablet [Pharmacy Med Name: HYDROCHLOROTHIAZIDE 25 MG ORAL TABLET] 90 tablet     Sig: TAKE 1 TABLET (25 MG TOTAL) BY MOUTH DAILY. (AM)     Cardiovascular: Diuretics - Thiazide Passed - 06/25/2020 12:50 PM      Passed - Ca in normal range and within 360 days    Calcium  Date Value Ref Range Status  03/18/2020 9.1  8.9 - 10.3 mg/dL Final   Calcium, Ion  Date Value Ref Range Status  03/01/2019 1.22 1.15 - 1.40 mmol/L Final         Passed - Cr in normal range and within 360 days    Creat  Date Value Ref Range Status  03/11/2016 0.90 0.50 - 1.05 mg/dL Final    Comment:      For patients > or = 61 years of age: The upper reference limit for Creatinine is approximately 13% higher for people identified as African-American.      Creatinine, Ser  Date Value Ref Range Status  03/18/2020 0.74 0.44 - 1.00 mg/dL Final   Creatinine, POC  Date Value Ref Range Status  06/10/2016 200 mg/dL Final         Passed - K in normal range and within 360 days    Potassium  Date Value Ref Range Status  03/18/2020 3.7 3.5 - 5.1 mmol/L Final         Passed - Na in normal range and within 360 days    Sodium  Date Value Ref Range Status   03/18/2020 138 135 - 145 mmol/L Final  03/02/2019 141 134 - 144 mmol/L Final         Passed - Last BP in normal range    BP Readings from Last 1 Encounters:  06/06/20 120/84         Passed - Valid encounter within last 6 months    Recent Outpatient Visits          2 weeks ago Type 2 diabetes mellitus with diabetic polyneuropathy, without long-term current use of insulin (HCC)   [place] [facility] [place], Gavin Pound B, MD   5 months ago Type 2 diabetes mellitus with diabetic polyneuropathy, without long-term current use of insulin (HCC)   [place] [facility] Marcine Matar, MD   7 months ago Need for influenza vaccination   [facility] And Wellness [place], Cornelius Moras, RPH-CPP   10 months ago Type 2 diabetes mellitus with diabetic polyneuropathy, without long-term current use of insulin [facility])   [place] [facility] Marcine Matar, MD   1 year ago Coarse tremors    [facility] Marcine Matar, MD      Future Appointments            In 1 month Glendale Chard, DO [place] [facility] [place]   In 1 month [name], Guy Sandifer, MD [facility]

## 2020-07-02 DIAGNOSIS — J301 Allergic rhinitis due to pollen: Secondary | ICD-10-CM | POA: Diagnosis not present

## 2020-07-02 DIAGNOSIS — J3081 Allergic rhinitis due to animal (cat) (dog) hair and dander: Secondary | ICD-10-CM | POA: Diagnosis not present

## 2020-07-02 DIAGNOSIS — J3089 Other allergic rhinitis: Secondary | ICD-10-CM | POA: Diagnosis not present

## 2020-07-03 ENCOUNTER — Other Ambulatory Visit: Payer: Self-pay | Admitting: Physician Assistant

## 2020-07-03 DIAGNOSIS — R109 Unspecified abdominal pain: Secondary | ICD-10-CM | POA: Diagnosis not present

## 2020-07-03 DIAGNOSIS — K589 Irritable bowel syndrome without diarrhea: Secondary | ICD-10-CM | POA: Diagnosis not present

## 2020-07-03 DIAGNOSIS — R197 Diarrhea, unspecified: Secondary | ICD-10-CM | POA: Diagnosis not present

## 2020-07-03 DIAGNOSIS — F331 Major depressive disorder, recurrent, moderate: Secondary | ICD-10-CM | POA: Diagnosis not present

## 2020-07-03 DIAGNOSIS — K2951 Unspecified chronic gastritis with bleeding: Secondary | ICD-10-CM | POA: Diagnosis not present

## 2020-07-03 DIAGNOSIS — R112 Nausea with vomiting, unspecified: Secondary | ICD-10-CM | POA: Diagnosis not present

## 2020-07-03 DIAGNOSIS — K3184 Gastroparesis: Secondary | ICD-10-CM | POA: Diagnosis not present

## 2020-07-04 ENCOUNTER — Ambulatory Visit
Admission: RE | Admit: 2020-07-04 | Discharge: 2020-07-04 | Disposition: A | Discharge status: Home / Self Care | Payer: Medicaid Other | Source: Ambulatory Visit | Attending: Physician Assistant | Admitting: Physician Assistant

## 2020-07-04 ENCOUNTER — Other Ambulatory Visit: Payer: Self-pay | Admitting: Physician Assistant

## 2020-07-04 DIAGNOSIS — R109 Unspecified abdominal pain: Secondary | ICD-10-CM

## 2020-07-08 DIAGNOSIS — J301 Allergic rhinitis due to pollen: Secondary | ICD-10-CM | POA: Diagnosis not present

## 2020-07-08 DIAGNOSIS — J3081 Allergic rhinitis due to animal (cat) (dog) hair and dander: Secondary | ICD-10-CM | POA: Diagnosis not present

## 2020-07-08 DIAGNOSIS — J3089 Other allergic rhinitis: Secondary | ICD-10-CM | POA: Diagnosis not present

## 2020-07-09 ENCOUNTER — Other Ambulatory Visit: Payer: Self-pay | Admitting: Internal Medicine

## 2020-07-09 DIAGNOSIS — J449 Chronic obstructive pulmonary disease, unspecified: Secondary | ICD-10-CM

## 2020-07-09 DIAGNOSIS — E119 Type 2 diabetes mellitus without complications: Secondary | ICD-10-CM

## 2020-07-09 NOTE — Telephone Encounter (Signed)
No future visit scheduled at this time . 

## 2020-07-09 NOTE — Telephone Encounter (Signed)
Needs Test strips as well she says, please advise

## 2020-07-09 NOTE — Telephone Encounter (Signed)
Requested medication (s) are due for refill today: Yes  Requested medication (s) are on the active medication list: Yes  Last refill:  01/31/19  Future visit scheduled: No  Notes to clinic:  Prescription expired.    Requested Prescriptions  Pending Prescriptions Disp Refills   Accu-Chek Softclix Lancets lancets [Pharmacy Med Name: ACCU-CHEK SOFTCLIX LANCETS] 100 each 12    Sig: USE AS INSTRUCTED 3 TIMES A DAY      Endocrinology: Diabetes - Testing Supplies Passed - 07/09/2020  2:51 PM      Passed - Valid encounter within last 12 months    Recent Outpatient Visits           1 month ago Type 2 diabetes mellitus with diabetic polyneuropathy, without long-term current use of insulin (HCC)   [place] [facility] [place], Gavin Pound B, MD   5 months ago Type 2 diabetes mellitus with diabetic polyneuropathy, without long-term current use of insulin (HCC)   [place] [facility] Marcine Matar, MD   7 months ago Need for influenza vaccination   [facility] [place], RPH-CPP   11 months ago Type 2 diabetes mellitus with diabetic polyneuropathy, without long-term current use of insulin [facility])   [place] [facility] Marcine Matar, MD   1 year ago Coarse tremors   [place] [facility] Marcine Matar, MD       Future Appointments             In 2 weeks Allena Katz, Noberto Retort, DO [place] [facility] [place]   In 2 weeks Kerrin Champagne, MD [facility]

## 2020-07-10 DIAGNOSIS — F331 Major depressive disorder, recurrent, moderate: Secondary | ICD-10-CM | POA: Diagnosis not present

## 2020-07-11 ENCOUNTER — Telehealth: Payer: Self-pay

## 2020-07-11 NOTE — Telephone Encounter (Signed)
Patient called she is requesting a updated copy of the letter from 07/27/2017 to be sent to her, she is requesting the letter to be mailed call back:[phone]

## 2020-07-12 NOTE — Telephone Encounter (Signed)
Waiting on Dr. [name] to get back in the office next week

## 2020-07-15 DIAGNOSIS — J301 Allergic rhinitis due to pollen: Secondary | ICD-10-CM | POA: Diagnosis not present

## 2020-07-15 DIAGNOSIS — J3089 Other allergic rhinitis: Secondary | ICD-10-CM | POA: Diagnosis not present

## 2020-07-16 ENCOUNTER — Other Ambulatory Visit: Payer: Self-pay

## 2020-07-16 ENCOUNTER — Ambulatory Visit: Payer: Medicaid Other | Admitting: Podiatry

## 2020-07-16 DIAGNOSIS — M79676 Pain in unspecified toe(s): Secondary | ICD-10-CM

## 2020-07-16 DIAGNOSIS — B351 Tinea unguium: Secondary | ICD-10-CM | POA: Diagnosis not present

## 2020-07-16 DIAGNOSIS — E1151 Type 2 diabetes mellitus with diabetic peripheral angiopathy without gangrene: Secondary | ICD-10-CM | POA: Diagnosis not present

## 2020-07-17 ENCOUNTER — Encounter: Payer: Self-pay | Admitting: Radiology

## 2020-07-17 DIAGNOSIS — R197 Diarrhea, unspecified: Secondary | ICD-10-CM | POA: Diagnosis not present

## 2020-07-17 DIAGNOSIS — F331 Major depressive disorder, recurrent, moderate: Secondary | ICD-10-CM | POA: Diagnosis not present

## 2020-07-17 NOTE — Telephone Encounter (Signed)
Letter mailed

## 2020-07-18 ENCOUNTER — Ambulatory Visit
Admission: RE | Admit: 2020-07-18 | Discharge: 2020-07-18 | Disposition: A | Discharge status: Home / Self Care | Payer: Medicaid Other | Source: Ambulatory Visit | Attending: Physician Assistant | Admitting: Physician Assistant

## 2020-07-18 DIAGNOSIS — M4316 Spondylolisthesis, lumbar region: Secondary | ICD-10-CM | POA: Diagnosis not present

## 2020-07-18 DIAGNOSIS — R109 Unspecified abdominal pain: Secondary | ICD-10-CM

## 2020-07-18 DIAGNOSIS — K7689 Other specified diseases of liver: Secondary | ICD-10-CM | POA: Diagnosis not present

## 2020-07-18 DIAGNOSIS — R102 Pelvic and perineal pain: Secondary | ICD-10-CM | POA: Diagnosis not present

## 2020-07-18 DIAGNOSIS — K838 Other specified diseases of biliary tract: Secondary | ICD-10-CM | POA: Diagnosis not present

## 2020-07-18 MED ORDER — IOPAMIDOL (ISOVUE-300) INJECTION 61%
100.0000 mL | Freq: Once | INTRAVENOUS | Status: AC | PRN
  Administered 2020-07-18: 100 mL via INTRAVENOUS

## 2020-07-22 ENCOUNTER — Encounter: Payer: Self-pay | Admitting: Podiatry

## 2020-07-22 DIAGNOSIS — J3089 Other allergic rhinitis: Secondary | ICD-10-CM | POA: Diagnosis not present

## 2020-07-22 DIAGNOSIS — J3081 Allergic rhinitis due to animal (cat) (dog) hair and dander: Secondary | ICD-10-CM | POA: Diagnosis not present

## 2020-07-22 DIAGNOSIS — J301 Allergic rhinitis due to pollen: Secondary | ICD-10-CM | POA: Diagnosis not present

## 2020-07-22 NOTE — Progress Notes (Signed)
Subjective: Stacy [name] is a pleasant 60 y.o. female patient seen today for at-risk foot care with h/o NIDDM and neuropathy. She has  painful thick toenails that are difficult to trim. Pain interferes with ambulation. Aggravating factors include wearing enclosed shoe gear. Pain is relieved with periodic professional debridement.  She states she has been suffering with GI issues and is being worked up for that presently. She voices no new pedal concerns on today's visit.  PCP is Marcine Matar, MD. Last visit was: 06/06/2020.  Allergies  Allergen Reactions   Other Shortness Of Breath    UNSPECIFIED AGENTS Allergic to perfumes and cleaning products   Shellfish Allergy Anaphylaxis    Per allergy test.   Ace Inhibitors Cough and Other (See Comments)        Celecoxib     Other reaction(s): upset stomach   Shellfish-Derived Products Other (See Comments)   Aspirin Nausea Only    Other reaction(s): stomach upset Other reaction(s): Unknown    Objective: Physical Exam  General: Stacy [name] is a pleasant 60 y.o. African American female, WD, WN in NAD. AAO x 3.   Vascular:  Capillary refill time to digits <4 seconds b/l lower extremities. Faintly palpable DP pulse(s) b/l lower extremities. Faintly palpable PT pulse(s) left foot. Nonpalpable PT pulse(s) right foot. Pedal hair absent. Lower extremity skin temperature gradient within normal limits.  Dermatological:  Pedal skin with normal turgor, texture and tone bilaterally. No open wounds bilaterally. No interdigital macerations bilaterally. Toenails 1-5 b/l elongated, discolored, dystrophic, thickened, crumbly with subungual debris and tenderness to dorsal palpation.  Musculoskeletal:  Normal muscle strength 5/5 to all lower extremity muscle groups bilaterally. No pain crepitus or joint limitation noted with ROM b/l. Hallux valgus with bunion deformity noted b/l feet. Patient ambulates independent of any assistive  aids.  Neurological:  Pt has subjective symptoms of neuropathy. Protective sensation intact 5/5 intact bilaterally with 10g monofilament b/l. Vibratory sensation intact b/l.  Assessment and Plan:  1. Pain due to onychomycosis of toenail   2. Type II diabetes mellitus with peripheral circulatory disorder (HCC)     -Examined patient. -Continue diabetic foot care principles. -Patient to continue soft, supportive shoe gear daily. -Toenails 1-5 b/l were debrided in length and girth with sterile nail nippers and dremel without iatrogenic bleeding.  -Patient to report any pedal injuries to medical professional immediately. -Patient/POA to call should there be question/concern in the interim.  Return in about 3 months (around 10/16/2020).  [name], DPM

## 2020-07-24 DIAGNOSIS — F331 Major depressive disorder, recurrent, moderate: Secondary | ICD-10-CM | POA: Diagnosis not present

## 2020-07-25 ENCOUNTER — Other Ambulatory Visit: Payer: Self-pay | Admitting: Internal Medicine

## 2020-07-25 ENCOUNTER — Other Ambulatory Visit: Payer: Self-pay | Admitting: Physical Medicine & Rehabilitation

## 2020-07-25 ENCOUNTER — Other Ambulatory Visit: Payer: Self-pay | Admitting: Specialist

## 2020-07-25 DIAGNOSIS — E1142 Type 2 diabetes mellitus with diabetic polyneuropathy: Secondary | ICD-10-CM

## 2020-07-26 ENCOUNTER — Ambulatory Visit: Payer: Medicaid Other | Admitting: Neurology

## 2020-07-29 ENCOUNTER — Ambulatory Visit (INDEPENDENT_AMBULATORY_CARE_PROVIDER_SITE_OTHER): Payer: Medicaid Other | Admitting: Specialist

## 2020-07-29 ENCOUNTER — Other Ambulatory Visit: Payer: Self-pay

## 2020-07-29 ENCOUNTER — Ambulatory Visit (INDEPENDENT_AMBULATORY_CARE_PROVIDER_SITE_OTHER): Payer: Medicaid Other

## 2020-07-29 ENCOUNTER — Encounter: Payer: Self-pay | Admitting: Specialist

## 2020-07-29 VITALS — BP 128/81 | HR 92 | Ht 66.0 in | Wt 180.0 lb

## 2020-07-29 DIAGNOSIS — M5440 Lumbago with sciatica, unspecified side: Secondary | ICD-10-CM

## 2020-07-29 DIAGNOSIS — M4726 Other spondylosis with radiculopathy, lumbar region: Secondary | ICD-10-CM | POA: Diagnosis not present

## 2020-07-29 DIAGNOSIS — M25562 Pain in left knee: Secondary | ICD-10-CM

## 2020-07-29 DIAGNOSIS — J3089 Other allergic rhinitis: Secondary | ICD-10-CM | POA: Diagnosis not present

## 2020-07-29 DIAGNOSIS — R296 Repeated falls: Secondary | ICD-10-CM

## 2020-07-29 DIAGNOSIS — M533 Sacrococcygeal disorders, not elsewhere classified: Secondary | ICD-10-CM

## 2020-07-29 DIAGNOSIS — M5137 Other intervertebral disc degeneration, lumbosacral region: Secondary | ICD-10-CM

## 2020-07-29 DIAGNOSIS — G8929 Other chronic pain: Secondary | ICD-10-CM | POA: Diagnosis not present

## 2020-07-29 DIAGNOSIS — J301 Allergic rhinitis due to pollen: Secondary | ICD-10-CM | POA: Diagnosis not present

## 2020-07-29 DIAGNOSIS — J3081 Allergic rhinitis due to animal (cat) (dog) hair and dander: Secondary | ICD-10-CM | POA: Diagnosis not present

## 2020-07-29 NOTE — Progress Notes (Addendum)
Office Visit Note   Patient: Stacy [name]           Date of Birth: 1961-01-31           MRN: [id number] Visit Date: 07/29/2020              Requested by: Stacy [name], Stacy B, MD [address] [place],  [place] [id number] PCP: Stacy [name], Stacy B, MD   Assessment & Plan: Visit Diagnoses:  1. Left knee pain, unspecified chronicity   2. Chronic right SI joint pain   3. Low back pain with sciatica, sciatica laterality unspecified, unspecified back pain laterality, unspecified chronicity   4. Other spondylosis with radiculopathy, lumbar region   5. Disc disease, degenerative, lumbar or lumbosacral   6. Falls frequently     Plan: Avoid bending, stooping and avoid lifting weights greater than 10 lbs. Avoid prolong standing and walking. Avoid frequent bending and stooping  No lifting greater than 10 lbs. May use ice or moist heat for pain. Weight loss is of benefit. Stacy [name]'s secretary/Assistant will call to arrange for left leg thigh muscles testing for lumbar radiculopathy.    Follow-Up Instructions: Return in about 3 weeks (around 08/19/2020).   Orders:  Orders Placed This Encounter  Procedures   XR Lumbar Spine 2-3 Views   XR KNEE 3 VIEW LEFT   No orders of the defined types were placed in this encounter.     Procedures: No procedures performed   Clinical Data: No additional findings.   Subjective: Chief Complaint  Patient presents with   Lower Back - Pain   Right Knee - Pain   Left Knee - Pain    [id number] female with history of right knee osteoarthritis, mainly patellofemoral and she had increasing left knee pain and has seen Stacy [name] and underwent left knee arthroscoping and reports her knee is giving away since the surgery 3-05/2020. Has seen Stacy [name] and evaluated. She reports that  Review of Systems  All other systems reviewed and are negative.   Objective: Vital Signs: BP 128/81 (BP Location: Left Arm, Patient Position: Sitting)    Pulse 92   Ht 5\' 6"  (1.676 m)   Wt 180 lb (81.6 kg)   LMP 09/01/2010   BMI 29.05 kg/m   Physical Exam Constitutional:      Appearance: She is well-developed.  HENT:     Head: Normocephalic and atraumatic.  Eyes:     Pupils: Pupils are equal, round, and reactive to light.  Pulmonary:     Effort: Pulmonary effort is normal.     Breath sounds: Normal breath sounds.  Abdominal:     General: Bowel sounds are normal.     Palpations: Abdomen is soft.  Musculoskeletal:     Cervical back: Normal range of motion and neck supple.  Skin:    General: Skin is warm and dry.  Neurological:     Mental Status: She is alert and oriented to person, place, and time.  Psychiatric:        Behavior: Behavior normal.        Thought Content: Thought content normal.        Judgment: Judgment normal.   Back Exam   Tenderness  The patient is experiencing tenderness in the lumbar.  Range of Motion  Extension:  abnormal  Flexion:  abnormal  Lateral bend right:  abnormal  Lateral bend left:  abnormal  Rotation right:  abnormal  Rotation left:  abnormal  Muscle Strength  The patient has normal back strength. Right Quadriceps:  5/5  Left Quadriceps:  5/5  Right Hamstrings:  5/5  Left Hamstrings:  5/5   Reflexes  Patellar:  0/4 Achilles:  0/4 abnormal  Comments:  Left knee with mild weakness, not sure if it is due to acute localized knee pain left P-F joint and also popping over the lateral left proximal tibial with, flexion and extension.      Specialty Comments:  No specialty comments available.  Imaging: XR KNEE 3 VIEW LEFT  Result Date: 07/29/2020 Ap and lateral radiographs left knee with P-F sunrise radiographs shows medial left knee join narrowing and also there is medial P-F facet joint narrowing.   XR Lumbar Spine 2-3 Views  Result Date: 07/29/2020 Ap and lateral lumbar demonstrates DDD L2-3, L3-4 and L4-5 mild L5-S1 with endplates sclerosis and cystic changes at L2-3 and  L3-4 . No acute findings.    PMFS History: Patient Active Problem List   Diagnosis Date Noted   Herniation of lumbar intervertebral disc with radiculopathy 10/02/2016    Priority: High    Class: Chronic   Chondromalacia of both patellae 06/03/2015    Priority: High    Class: Chronic   Colon polyps 06/06/2020   Lupus (HCC) 06/06/2020   Allergic rhinitis due to animal (cat) (dog) hair and dander 04/08/2020   Allergic rhinitis due to pollen 04/08/2020   Food allergy 04/08/2020   Acute medial meniscus tear, left, subsequent encounter 03/21/2020   Chronic pain of left knee 02/15/2020   Paresthesia of skin 11/16/2019   History of total knee replacement, right 11/16/2019   Tobacco abuse 11/16/2019   Centrilobular emphysema (HCC) 08/10/2019   Incidental lung nodule, > 39mm and < 110mm 08/10/2019   OSA on CPAP 08/10/2019   Dyspnea on exertion 08/02/2019   Hyperlipidemia 08/02/2019   Lumbar radiculopathy 04/24/2019   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   Post laminectomy syndrome 02/23/2019   Abnormality of gait 02/23/2019   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   Unilateral primary osteoarthritis, left hip 12/28/2018   Lesion of skin of left ear 12/26/2018   Primary osteoarthritis of left hip 12/02/2018   Iron deficiency anemia 10/16/2018   Chronic pain syndrome 09/08/2018   Chronic pain of right knee 08/11/2018   Status post lumbar laminectomy 07/15/2018   Peripheral arterial disease (HCC) 04/05/2018   Moderate persistent asthma without complication 06/29/2017   Environmental and seasonal allergies 06/29/2017   Controlled type 2 diabetes mellitus with diabetic polyneuropathy, without long-term current use of insulin (HCC) 06/29/2017   Perennial allergic rhinitis 04/08/2017   Sensorineural hearing loss (SNHL), bilateral 04/08/2017   Chronic pansinusitis 03/25/2017   Eustachian tube dysfunction, bilateral 03/25/2017   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   Alopecia areata 08/19/2016    Spinal stenosis, lumbar region, with neurogenic claudication 06/03/2015   Tobacco use disorder 04/25/2015   DJD (degenerative joint disease) of knee 01/04/2015   Hemorrhoid 11/14/2014   Gout of big toe 07/19/2014   Essential hypertension 08/14/2013   Gastroesophageal reflux disease without esophagitis 08/14/2013   COPD (chronic obstructive pulmonary disease) (HCC) 04/17/2011   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   Asthma    COPD (chronic obstructive pulmonary disease) (HCC)    Depression    Diabetes mellitus    Type  II   GERD (gastroesophageal reflux disease)    Pt on Protonix daily   Glaucoma    Gout    Headache(784.0)    otc meds prn   Hyperlipidemia    Hypertension    Irritable bowel syndrome 11/19/2010   Neuropathy    Pneumonia YRS AGO   Restless legs    Shortness of breath    07/14/2018- uses  4 times a day    Family History  Problem Relation Age of Onset   Hypertension Father    Cancer Father    Heart disease Mother    Asthma Son        had as a child   Heart disease Sister    Breast cancer Sister    Hypertension Brother     Past Surgical History:  Procedure Laterality Date   BACK SURGERY     CHOLECYSTECTOMY     COLONOSCOPY     ENDOMETRIAL ABLATION  10/2010   EYE SURGERY     HERNIA REPAIR     umbicial hernia   JOINT REPLACEMENT Left 03/14/2019   Dr. Magnus Ivan hip   KNEE ARTHROSCOPY Left    06/07/2017 Dr. August [facility]   KNEE ARTHROSCOPY Left 03/21/2020   Procedure: LEFT KNEE ARTHROSCOPY WITH PARTIAL MEDIAL MENISCECTOMY;  Surgeon: [name], MD;  Location: [facility];  Service: Orthopedics;  Laterality: Left;   KNEE CLOSED REDUCTION Right 12/06/2015   Procedure: CLOSED MANIPULATION RIGHT KNEE;  Surgeon: Kerrin Champagne, MD;  Location: [facility] OR;  Service: Orthopedics;  Laterality: Right;   KNEE  CLOSED REDUCTION Right 01/17/2016   Procedure: CLOSED MANIPULATION RIGHT KNEE;  Surgeon: Kerrin Champagne, MD;  Location: [facility];  Service: Orthopedics;  Laterality: Right;   KNEE JOINT MANIPULATION Right 12/06/2015   LACRIMAL TUBE INSERTION Bilateral 03/01/2019   Procedure: LACRIMAL TUBE INSERTION;  Surgeon: Aura Camps, MD;  Location: [facility];  Service: Ophthalmology;  Laterality: Bilateral;   LACRIMAL TUBE REMOVAL Bilateral 05/03/2019   Procedure: BILATERAL NASOLACRIMAL DUCT PROBING, IIRIGATION AND TUBE REMOVAL BOTH EYES;  Surgeon: Aura Camps, MD;  Location: [facility];  Service: Ophthalmology;  Laterality: Bilateral;   LUMBAR DISC SURGERY  06/03/2015   L 2  L3 L4 L5    LUMBAR LAMINECTOMY/DECOMPRESSION MICRODISCECTOMY N/A 06/03/2015   Procedure: Bilateral lateral recess decompression L2-3, L3-4, L4-5;  Surgeon: Kerrin Champagne, MD;  Location: [facility];  Service: Orthopedics;  Laterality: N/A;   LUMBAR LAMINECTOMY/DECOMPRESSION MICRODISCECTOMY N/A 10/02/2016   Procedure: Right L5-S1 Lateral Recess Decompression  microdiscectomy;  Surgeon: Kerrin Champagne, MD;  Location: [facility] OR;  Service: Orthopedics;  Laterality: N/A;   LUMBAR LAMINECTOMY/DECOMPRESSION MICRODISCECTOMY N/A 07/15/2018   Procedure: LEFT L3-4 MICRODISCECTOMY;  Surgeon: Kerrin Champagne, MD;  Location: [facility] OR;  Service: Orthopedics;  Laterality: N/A;   svd      x 2   TEAR DUCT PROBING Bilateral 03/01/2019   Procedure: TEAR DUCT PROBING WITH IRRIGATION;  Surgeon: Aura Camps, MD;  Location: [facility];  Service: Ophthalmology;  Laterality: Bilateral;   TOTAL HIP ARTHROPLASTY Left 03/14/2019   Procedure: LEFT TOTAL HIP ARTHROPLASTY ANTERIOR APPROACH;  Surgeon: [name], MD;  Location: [facility];  Service: Orthopedics;  Laterality: Left;   TOTAL KNEE ARTHROPLASTY Right 09/06/2015   Procedure: RIGHT TOTAL KNEE ARTHROPLASTY;  Surgeon: Kerrin Champagne, MD;  Location: [facility];  Service:  Orthopedics;  Laterality: Right;   TUBAL LIGATION     UPPER  GASTROINTESTINAL ENDOSCOPY  04/28/11   Social History   Occupational History   Occupation: unemployed    Associate Professor: UNEMPLOYED  Tobacco Use   Smoking status: Some Days    Packs/day: 0.25    Years: 32.00    Pack years: 8.00    Types: Cigarettes   Smokeless tobacco: Never   Tobacco comments:    DOWN TO 3 PER DAY  Vaping Use   Vaping Use: Never used  Substance and Sexual Activity   Alcohol use: No   Drug use: No   Sexual activity: Yes    Birth control/protection: Surgical, Post-menopausal    Comment: tubal ligation

## 2020-07-29 NOTE — Patient Instructions (Signed)
  Plan: Avoid bending, stooping and avoid lifting weights greater than 10 lbs. Avoid prolong standing and walking. Avoid frequent bending and stooping  No lifting greater than 10 lbs. May use ice or moist heat for pain. Weight loss is of benefit. Dr. [place] Blas secretary/Assistant will call to arrange for left lig thigh muscle testing for lumbar radiculopathy.

## 2020-07-31 DIAGNOSIS — F331 Major depressive disorder, recurrent, moderate: Secondary | ICD-10-CM | POA: Diagnosis not present

## 2020-08-07 DIAGNOSIS — J3089 Other allergic rhinitis: Secondary | ICD-10-CM | POA: Diagnosis not present

## 2020-08-07 DIAGNOSIS — K3184 Gastroparesis: Secondary | ICD-10-CM | POA: Diagnosis not present

## 2020-08-07 DIAGNOSIS — K589 Irritable bowel syndrome without diarrhea: Secondary | ICD-10-CM | POA: Diagnosis not present

## 2020-08-07 DIAGNOSIS — J3081 Allergic rhinitis due to animal (cat) (dog) hair and dander: Secondary | ICD-10-CM | POA: Diagnosis not present

## 2020-08-07 DIAGNOSIS — K219 Gastro-esophageal reflux disease without esophagitis: Secondary | ICD-10-CM | POA: Diagnosis not present

## 2020-08-07 DIAGNOSIS — J301 Allergic rhinitis due to pollen: Secondary | ICD-10-CM | POA: Diagnosis not present

## 2020-08-07 DIAGNOSIS — F331 Major depressive disorder, recurrent, moderate: Secondary | ICD-10-CM | POA: Diagnosis not present

## 2020-08-07 DIAGNOSIS — K2970 Gastritis, unspecified, without bleeding: Secondary | ICD-10-CM | POA: Diagnosis not present

## 2020-08-08 ENCOUNTER — Ambulatory Visit: Payer: Medicaid Other | Admitting: Neurology

## 2020-08-08 ENCOUNTER — Encounter: Payer: Self-pay | Admitting: Neurology

## 2020-08-08 ENCOUNTER — Other Ambulatory Visit: Payer: Self-pay

## 2020-08-08 VITALS — BP 96/61 | HR 91 | Ht 66.0 in | Wt 174.0 lb

## 2020-08-08 DIAGNOSIS — R202 Paresthesia of skin: Secondary | ICD-10-CM | POA: Diagnosis not present

## 2020-08-08 DIAGNOSIS — E1142 Type 2 diabetes mellitus with diabetic polyneuropathy: Secondary | ICD-10-CM | POA: Diagnosis not present

## 2020-08-08 NOTE — Progress Notes (Signed)
[place] Clinic Note - Initial Visit   Date: 08/08/20  [name] MRN: [id number] DOB: [birth year]   Dear Dr. [name]:  Thank you for your kind referral of Stacy [name] [facility] for consultation of hand tingling. Although her history is well known to you, please allow [place] to reiterate it for the purpose of our medical record. The patient was accompanied to the clinic by self.    History of Present Illness: Stacy [name] is a 60 y.o. left-handed female with diabetes mellitus complicated by neuropathy, hypertension, chronic pain, s/p lumbar sugery x 2, depression, and tobacco use presenting for evaluation of bilateral hand tingling. Starting around the end of 2021, she began having numbness and tingling of all the fingers, which is worse on the left.  She wakes up at night about 2-3 times per week with her falling asleep.  She has some weakness in the hands.  She has been using wrist braces which provides some relief.  No neck or radicular pain.  She has numbness in the feet from diabetic neuropathy.   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  She lives at home with her husband.    Out-side paper records, electronic medical record, and images have been reviewed where available and summarized as:  Lab Results  Component Value Date   HGBA1C 7.4 (A) 06/06/2020   No results found for: [facility] Lab Results  Component Value Date   TSH 2.673 03/27/2019   Lab Results  Component Value Date   ESRSEDRATE 33 (H) 08/03/2017    Past Medical History:  Diagnosis Date   Allergy    Shellfish, cleaning products   Anxiety    Arthritis    Arthrofibrosis of total knee replacement ([place])    right   Asthma    COPD (chronic obstructive pulmonary disease) ([place])    Depression    Diabetes mellitus    Type II   GERD (gastroesophageal reflux disease)    Pt on Protonix daily   Glaucoma    Gout    Headache(784.0)    otc meds prn    Hyperlipidemia    Hypertension    Irritable bowel syndrome 11/19/2010   Neuropathy    Pneumonia YRS AGO   Restless legs    Shortness of breath    07/14/2018- uses  4 times a day    Past Surgical History:  Procedure Laterality Date   BACK SURGERY     CHOLECYSTECTOMY     COLONOSCOPY     ENDOMETRIAL ABLATION  10/2010   EYE SURGERY     HERNIA REPAIR     umbicial hernia   JOINT REPLACEMENT Left 03/14/2019   Dr. Ninfa Linden hip   KNEE ARTHROSCOPY Left    06/07/2017 Dr. Marlou Sa of [place] ARTHROSCOPY Left 03/21/2020   Procedure: LEFT KNEE ARTHROSCOPY WITH PARTIAL MEDIAL MENISCECTOMY;  Surgeon: Mcarthur Rossetti, MD;  Location: [place];  Service: Orthopedics;  Laterality: Left;   KNEE CLOSED REDUCTION Right 12/06/2015   Procedure: CLOSED MANIPULATION RIGHT KNEE;  Surgeon: [name], MD;  Location: [place];  Service: Orthopedics;  Laterality: Right;   KNEE CLOSED REDUCTION Right 01/17/2016   Procedure: CLOSED MANIPULATION RIGHT KNEE;  Surgeon: [name], MD;  Location: [place];  Service: Orthopedics;  Laterality: Right;   KNEE JOINT MANIPULATION Right 12/06/2015   LACRIMAL TUBE INSERTION Bilateral 03/01/2019   Procedure: LACRIMAL TUBE INSERTION;  Surgeon: Gevena Cotton, MD;  Location:  [place];  Service: Ophthalmology;  Laterality: Bilateral;   LACRIMAL TUBE REMOVAL Bilateral 05/03/2019   Procedure: BILATERAL NASOLACRIMAL DUCT PROBING, IIRIGATION AND TUBE REMOVAL BOTH EYES;  Surgeon: Gevena Cotton, MD;  Location: [place];  Service: Ophthalmology;  Laterality: Bilateral;   LUMBAR DISC SURGERY  06/03/2015   L 2  L3 L4 L5    LUMBAR LAMINECTOMY/DECOMPRESSION MICRODISCECTOMY N/A 06/03/2015   Procedure: Bilateral lateral recess decompression L2-3, L3-4, L4-5;  Surgeon: [name], MD;  Location: [place];  Service: Orthopedics;  Laterality: N/A;   LUMBAR LAMINECTOMY/DECOMPRESSION MICRODISCECTOMY N/A 10/02/2016   Procedure:  Right L5-S1 Lateral Recess Decompression  microdiscectomy;  Surgeon: [name], MD;  Location: [place];  Service: Orthopedics;  Laterality: N/A;   LUMBAR LAMINECTOMY/DECOMPRESSION MICRODISCECTOMY N/A 07/15/2018   Procedure: LEFT L3-4 MICRODISCECTOMY;  Surgeon: [name], MD;  Location: [place];  Service: Orthopedics;  Laterality: N/A;   svd      x 2   TEAR DUCT PROBING Bilateral 03/01/2019   Procedure: TEAR DUCT PROBING WITH IRRIGATION;  Surgeon: Gevena Cotton, MD;  Location: [facility];  Service: Ophthalmology;  Laterality: Bilateral;   TOTAL HIP ARTHROPLASTY Left 03/14/2019   Procedure: LEFT TOTAL HIP ARTHROPLASTY ANTERIOR APPROACH;  Surgeon: Mcarthur Rossetti, MD;  Location: [place];  Service: Orthopedics;  Laterality: Left;   TOTAL KNEE ARTHROPLASTY Right 09/06/2015   Procedure: RIGHT TOTAL KNEE ARTHROPLASTY;  Surgeon: [name], MD;  Location: [place];  Service: Orthopedics;  Laterality: Right;   TUBAL LIGATION     UPPER GASTROINTESTINAL ENDOSCOPY  04/28/11     Medications:  Outpatient Encounter Medications as of 08/08/2020  Medication Sig   albuterol (PROVENTIL) (2.5 MG/3ML) 0.083% nebulizer solution USE ONE VIAL (2.5 MG TOTAL) BY NEBULIZATION EVERY 6 (SIX) HOURS AS NEEDED FOR WHEEZING.   allopurinol (ZYLOPRIM) 100 MG tablet TAKE 1 TABLET (100 MG TOTAL) BY MOUTH DAILY.   budesonide-formoterol (SYMBICORT) 80-4.5 MCG/ACT inhaler Inhale 2 puffs into the lungs 2 (two) times daily.   diclofenac Sodium (VOLTAREN) 1 % GEL APPLY 4 GRAMS TOPICALLY 4 (FOUR) TIMES DAILY.   dicyclomine (BENTYL) 10 MG capsule Tale one capsule 3 times daily   EPINEPHrine 0.3 mg/0.3 mL IJ SOAJ injection 0.3 mg IM x 1 PRN for allergic reaction (Patient taking differently: Inject 0.3 mg into the muscle once as needed for anaphylaxis.)   fluticasone (FLONASE) 50 MCG/ACT nasal spray Place 1 spray into both nostrils daily.   hydrOXYzine (ATARAX/VISTARIL) 10 MG tablet TAKE 1 TABLET IN THE MORNING AND  NOON AND 2 TABLETS IN THE EVENING AS NEEDED   loperamide (IMODIUM) 2 MG capsule TAKE 1 CAPSULE (2 MG TOTAL) BY MOUTH 4 (FOUR) TIMES DAILY AS NEEDED FOR DIARRHEA OR LOOSE STOOLS.   metoCLOPramide (REGLAN) 10 MG tablet Take 10 mg by mouth 3 (three) times daily as needed.   Multiple Vitamins-Minerals (CENTRUM SILVER 50+WOMEN PO) Take by mouth.   SPIRIVA HANDIHALER 18 MCG inhalation capsule PLACE 1 CAPSULE (18 MCG TOTAL) INTO INHALER AND INHALE DAILY.   sucralfate (CARAFATE) 1 g tablet Take 1 g by mouth 4 (four) times daily -  with meals and at bedtime.   ACCU-CHEK AVIVA PLUS test strip USE 3 TIMES A DAY AS DIRECTED BY PHYSICIAN   Accu-Chek Softclix Lancets lancets USE AS INSTRUCTED 3 TIMES A DAY   ALLERGY RELIEF 10 MG tablet TAKE 1 TABLET (10 MG TOTAL) BY MOUTH DAILY.   atorvastatin (LIPITOR) 20 MG tablet TAKE 1  TABLET (20 MG TOTAL) BY MOUTH DAILY.   Blood Glucose Monitoring Suppl (ACCU-CHEK AVIVA PLUS) w/Device KIT 1 each by Does not apply route 3 (three) times daily.   budesonide-formoterol (SYMBICORT) 80-4.5 MCG/ACT inhaler INHALE TWO PUFFS BY MOUTH TWICE A DAY (Patient taking differently: Inhale 2 puffs into the lungs 2 (two) times a day.)   celecoxib (CELEBREX) 200 MG capsule Take 200 mg by mouth 2 (two) times daily.   cetirizine (ZYRTEC) 10 MG tablet 1 tablet   Cholecalciferol 100000 UNIT/GM POWD 1 capsule   clopidogrel (PLAVIX) 75 MG tablet TAKE 1 TABLET (75 MG TOTAL) BY MOUTH DAILY.   DULoxetine (CYMBALTA) 60 MG capsule Take 1 capsule (60 mg total) by mouth daily.   [name] 325 (65 Fe) MG tablet TAKE 1 TABLET BY MOUTH 2 (TWO) TIMES DAILY WITH A MEAL.   FLOWFLEX COVID-19 AG HOME TEST KIT See admin instructions.   fluocinonide (LIDEX) 0.05 % external solution 1 application to affected area (Patient not taking: Reported on 06/06/2020)   gabapentin (NEURONTIN) 600 MG tablet TAKE ONE CAPSULE BY MOUTH THREE TIMES A DAY (AM+NOON+BEDTIME)   glimepiride (AMARYL) 2 MG tablet TAKE 1 TABLET (2 MG TOTAL)  BY MOUTH DAILY. (AM)   GOLYTELY 236 g solution Take by mouth as directed.   HYDROcodone-acetaminophen (NORCO/VICODIN) 5-325 MG tablet Take 1-2 tablets by mouth every 6 (six) hours as needed for moderate pain.   Lancets (ACCU-CHEK SOFT TOUCH) lancets Use as instructed   Melatonin 3 MG TABS Take 1 tablet (3 mg total) by mouth at bedtime as needed. (Patient taking differently: Take 3 mg by mouth at bedtime as needed (sleep).)   meloxicam (MOBIC) 15 MG tablet Take 1 tablet by mouth daily.   metFORMIN (GLUCOPHAGE-XR) 750 MG 24 hr tablet Take 1 tablet (750 mg total) by mouth 2 (two) times daily.   methocarbamol (ROBAXIN) 750 MG tablet TAKE 1 TABLET (750 MG TOTAL) BY MOUTH EVERY 12 (TWELVE) HOURS AS NEEDED FOR MUSCLE SPASMS.   montelukast (SINGULAIR) 10 MG tablet Take 10 mg by mouth at bedtime.    Multiple Vitamin (MULTIVITAMIN WITH MINERALS) TABS tablet Take 1 tablet by mouth daily. CENTRUM SILVER 50 plus   mupirocin ointment (BACTROBAN) 2 % Apply 1 application topically 3 (three) times daily.   nicotine (NICODERM CQ - DOSED IN MG/24 HOURS) 14 mg/24hr patch PLACE 1 PATCH ONTO THE SKIN DAILY   ondansetron (ZOFRAN ODT) 4 MG disintegrating tablet Take 1 tablet (4 mg total) by mouth every 8 (eight) hours as needed for nausea or vomiting.   ondansetron (ZOFRAN) 4 MG tablet Take 4 mg by mouth 2 (two) times daily as needed.   pantoprazole (PROTONIX) 40 MG tablet Take 1 tablet (40 mg total) by mouth daily.   polyethylene glycol (MIRALAX / GLYCOLAX) 17 g packet    potassium chloride SA (KLOR-CON) 20 MEQ tablet TAKE ONE TABLET BY MOUTH ONCE DAILY   promethazine (PHENERGAN) 25 MG tablet TK 1 T PO  Q 6 H PRN FOR NAUSEA OR VOMITING   Psyllium (METAMUCIL) 0.36 g CAPS    Respiratory Therapy Supplies (FLUTTER) DEVI Use after breathing treatment 4 times daily   senna-docusate (SENOKOT S) 8.6-50 MG tablet Take 1 tablet by mouth daily as needed for mild constipation.   simvastatin (ZOCOR) 20 MG tablet TAKE 1 TABLET (20  MG TOTAL) BY MOUTH AT BEDTIME.   triamcinolone cream (KENALOG) 0.1 % Apply 1 application topically 2 (two) times daily as needed (lupus rash).    valsartan-hydrochlorothiazide (DIOVAN-HCT) 160-12.5  MG tablet TAKE ONE TABLET BY MOUTH ONCE DAILY   [DISCONTINUED] Fluticasone-Salmeterol (ADVAIR) 500-50 MCG/DOSE AEPB Inhale 1 puff into the lungs every 12 (twelve) hours.     [DISCONTINUED] lisinopril (PRINIVIL,ZESTRIL) 40 MG tablet Take 40 mg by mouth daily.     No facility-administered encounter medications on file as of 08/08/2020.    Allergies:  Allergies  Allergen Reactions   Other Shortness Of Breath    UNSPECIFIED AGENTS Allergic to perfumes and cleaning products   Shellfish Allergy Anaphylaxis    Per allergy test.   Ace Inhibitors Cough and Other (See Comments)        Celecoxib     Other reaction(s): upset stomach   Shellfish-Derived Products Other (See Comments)   Aspirin Nausea Only    Other reaction(s): stomach upset Other reaction(s): Unknown    Family History: Family History  Problem Relation Age of Onset   Hypertension Father    Cancer Father    Heart disease Mother    Asthma Son        had as a child   Heart disease Sister    Breast cancer Sister    Hypertension Brother     Social History: Social History   Tobacco Use   Smoking status: Some Days    Packs/day: 0.25    Years: 32.00    Pack years: 8.00    Types: Cigarettes   Smokeless tobacco: Never   Tobacco comments:    DOWN TO 3 PER DAY  Vaping Use   Vaping Use: Never used  Substance Use Topics   Alcohol use: No   Drug use: No   Social History   Social History Narrative   Left Handed    Lives in a two story apartment.    Drinks Caffeine     Vital Signs:  BP 96/61   Pulse 91   Ht 5' 6" (1.676 m)   Wt 174 lb (78.9 kg)   LMP 09/01/2010   SpO2 95%   BMI 28.08 kg/m   Neurological Exam: MENTAL STATUS including orientation to time, place, person, recent and remote memory, attention span and  concentration, language, and fund of knowledge is normal.  Speech is not dysarthric.  CRANIAL NERVES: II:  No visual field defects.  Unremarkable fundi.   III-IV-VI: Pupils equal round and reactive to light.  Normal conjugate, extra-ocular eye movements in all directions of gaze.  No nystagmus.  No ptosis.   V:  Normal facial sensation.    VII:  Normal facial symmetry and movements.   VIII:  Normal hearing and vestibular function.   IX-X:  Normal palatal movement.   XI:  Normal shoulder shrug and head rotation.   XII:  Normal tongue strength and range of motion, no deviation or fasciculation.  MOTOR:  No atrophy, fasciculations or abnormal movements.  No pronator drift.   Upper Extremity:  Right  Left  Deltoid  5/5   5/5   Biceps  5/5   5/5   Triceps  5/5   5/5   Infraspinatus 5/5  5/5  Medial pectoralis 5/5  5/5  Wrist extensors  5/5   5/5   Wrist flexors  5/5   5/5   Finger extensors  5/5   5/5   Finger flexors  5/5   5/5   Dorsal interossei  5/5   5/5   Abductor pollicis  5/5   5/5   Tone (Ashworth scale)  0  0   Lower Extremity:  Right  Left  Hip flexors  5/5   5/5   Hip extensors  5/5   5/5   Adductor 5/5  5/5  Abductor 5/5  5/5  Knee flexors  5/5   5/5   Knee extensors  5/5   5/5   Dorsiflexors  5/5   5/5   Plantarflexors  5/5   5/5   Toe extensors  5/5   5/5   Toe flexors  5/5   5/5   Tone (Ashworth scale)  0  0   MSRs:  Right        Left                  brachioradialis 2+  2+  biceps 2+  3+  triceps 2+  2+  patellar 2+  2+  ankle jerk 2+  2+  Hoffman no  no  plantar response down  down   SENSORY:  Vibration reduced at the ankles bilaterally, intact at the knees and hands.  Temperature and pin prick reduced from the knees down.    COORDINATION/GAIT: Normal finger-to- nose-finger.  Intact rapid alternating movements bilaterally.  Gait is assisted with walker, stable.    IMPRESSION: Bilateral hand paresthesias  - Ddx:  entrapment neuropathy, cervical  radiculopathy, vs progression of neuropathy.  Exam shoes left briceps reflex to be asymmetric brisk so she may also have underlying cervical canal stenosis.   - NCS/EMG of the arms to better localize symptoms.   - Based on the results of her EMG, consider MRI cervical spine   - Continue to wear wrist splints  2.  Diabetic neuropathy manifesting with sensory loss in the feet and sensory ataxia - Patient educated on daily foot inspection, fall prevention, and safety precautions around the home. - She is followed by pain management   Further recommendations pending results.   Thank you for allowing me to participate in patient's care.  If I can answer any additional questions, I would be pleased to do so.    Sincerely,    Vienna Folden K. Posey Pronto, DO

## 2020-08-08 NOTE — Patient Instructions (Addendum)
Nerve testing of the arms.  Do not apply lotion or oil to your skin on the day of testing.   We will let you know the next steps based on the results of your testing

## 2020-08-14 DIAGNOSIS — F331 Major depressive disorder, recurrent, moderate: Secondary | ICD-10-CM | POA: Diagnosis not present

## 2020-08-15 ENCOUNTER — Ambulatory Visit: Payer: Medicaid Other | Admitting: Neurology

## 2020-08-19 DIAGNOSIS — J301 Allergic rhinitis due to pollen: Secondary | ICD-10-CM | POA: Diagnosis not present

## 2020-08-19 DIAGNOSIS — J3081 Allergic rhinitis due to animal (cat) (dog) hair and dander: Secondary | ICD-10-CM | POA: Diagnosis not present

## 2020-08-19 DIAGNOSIS — J3089 Other allergic rhinitis: Secondary | ICD-10-CM | POA: Diagnosis not present

## 2020-08-21 ENCOUNTER — Encounter: Payer: Self-pay | Admitting: Surgery

## 2020-08-21 ENCOUNTER — Other Ambulatory Visit: Payer: Self-pay

## 2020-08-21 ENCOUNTER — Ambulatory Visit (INDEPENDENT_AMBULATORY_CARE_PROVIDER_SITE_OTHER): Payer: Medicaid Other | Admitting: Surgery

## 2020-08-21 VITALS — Ht 66.0 in | Wt 180.0 lb

## 2020-08-21 DIAGNOSIS — F331 Major depressive disorder, recurrent, moderate: Secondary | ICD-10-CM | POA: Diagnosis not present

## 2020-08-21 DIAGNOSIS — R202 Paresthesia of skin: Secondary | ICD-10-CM

## 2020-08-21 DIAGNOSIS — R296 Repeated falls: Secondary | ICD-10-CM

## 2020-08-21 DIAGNOSIS — R200 Anesthesia of skin: Secondary | ICD-10-CM

## 2020-08-22 ENCOUNTER — Other Ambulatory Visit: Payer: Self-pay | Admitting: Physical Medicine & Rehabilitation

## 2020-08-22 ENCOUNTER — Other Ambulatory Visit: Payer: Self-pay | Admitting: Internal Medicine

## 2020-08-23 ENCOUNTER — Other Ambulatory Visit: Payer: Self-pay | Admitting: Physical Medicine & Rehabilitation

## 2020-08-23 ENCOUNTER — Other Ambulatory Visit: Payer: Self-pay | Admitting: Internal Medicine

## 2020-08-23 DIAGNOSIS — E119 Type 2 diabetes mellitus without complications: Secondary | ICD-10-CM

## 2020-08-23 DIAGNOSIS — F17200 Nicotine dependence, unspecified, uncomplicated: Secondary | ICD-10-CM

## 2020-08-23 NOTE — Telephone Encounter (Signed)
  Notes to clinic:  Patient last filled on 06/26/2020 Review for continued use and dose    Requested Prescriptions  Pending Prescriptions Disp Refills   NICOTINE STEP 2 14 MG/24HR patch [Pharmacy Med Name: NICOTINE STEP 2 14 MG/24HR TRANSDERMAL PATCH 24 HOUR]      Sig: PLACE 1 PATCH ONTO THE SKIN DAILY      Psychiatry:  Drug Dependence Therapy Passed - 08/23/2020  1:17 PM      Passed - Valid encounter within last 12 months    Recent Outpatient Visits           2 months ago Type 2 diabetes mellitus with diabetic polyneuropathy, without long-term current use of insulin (HCC)   [facility] [place], Gavin Pound B, MD   7 months ago Type 2 diabetes mellitus with diabetic polyneuropathy, without long-term current use of insulin (HCC)   [facility] [facility] Marcine Matar, MD   9 months ago Need for influenza vaccination   [facility] Drucilla Chalet, RPH-CPP   1 year ago Type 2 diabetes mellitus with diabetic polyneuropathy, without long-term current use of insulin (HCC)   [facility] Marcine Matar, MD   1 year ago Coarse tremors   [facility] Marcine Matar, MD       Future Appointments             In 1 month [name], Guy Sandifer, MD [facility] [place]               hydrOXYzine (ATARAX/VISTARIL) 10 MG tablet [Pharmacy Med Name: HYDROXYZINE HCL 10 MG ORAL TABLET] 120 tablet 2    Sig: TAKE 1 TABLET IN THE MORNING AND NOON AND 2 TABLETS IN THE EVENING AS NEEDED      Ear, Nose, and Throat:  Antihistamines Passed - 08/23/2020  1:17 PM      Passed - Valid encounter within last 12 months    Recent Outpatient Visits           2 months ago Type 2 diabetes mellitus with diabetic polyneuropathy, without long-term current use of insulin (HCC)   [place] [facility] [place], Gavin Pound B, MD   7 months ago Type 2  diabetes mellitus with diabetic polyneuropathy, without long-term current use of insulin (HCC)   [place] [facility] Marcine Matar, MD   9 months ago Need for influenza vaccination   [facility] Drucilla Chalet, RPH-CPP   1 year ago Type 2 diabetes mellitus with diabetic polyneuropathy, without long-term current use of insulin [facility])   [place] [facility] Marcine Matar, MD   1 year ago Coarse tremors   [place] [facility] Marcine Matar, MD       Future Appointments             In 1 month [name], Guy Sandifer, MD [facility]

## 2020-08-27 ENCOUNTER — Other Ambulatory Visit: Payer: Self-pay

## 2020-08-27 ENCOUNTER — Encounter: Payer: Self-pay | Admitting: Physician Assistant

## 2020-08-27 ENCOUNTER — Ambulatory Visit (INDEPENDENT_AMBULATORY_CARE_PROVIDER_SITE_OTHER): Payer: Medicaid Other | Admitting: Physician Assistant

## 2020-08-27 VITALS — BP 136/80 | HR 72 | Temp 98.4°F | Resp 18 | Ht 66.0 in | Wt 167.0 lb

## 2020-08-27 DIAGNOSIS — S6992XA Unspecified injury of left wrist, hand and finger(s), initial encounter: Secondary | ICD-10-CM | POA: Diagnosis not present

## 2020-08-27 DIAGNOSIS — M329 Systemic lupus erythematosus, unspecified: Secondary | ICD-10-CM

## 2020-08-27 MED ORDER — TRIAMCINOLONE ACETONIDE 0.1 % EX CREA: 1.0000 "application " | TOPICAL_CREAM | Freq: Two times a day (BID) | CUTANEOUS | 0 refills | Status: DC

## 2020-08-27 NOTE — Progress Notes (Signed)
Patient has not eaten today and patient has not taken medication today. Patient complains of thumb nail pain since yesterday. Patient shares the nail began to curve with the length and started lifting up. Patient request refill on triamcinolone cream.

## 2020-08-27 NOTE — Progress Notes (Signed)
Established Patient Office Visit  Subjective:  Patient ID: Stacy [name], female    DOB: 03/11/1960  Age: 60 y.o. MRN: [id number]  CC:  Chief Complaint  Patient presents with   Nail Problem    Thumb on L Hand    HPI Stacy [name] reports that she hit and broke her left thumbnail yesterday.  Reports that she cut it down herself, and noticed "another nail growing out under it".  Reports that it is tender to the touch, did not have any bleeding or open wounds.  Requests a refill of her triamcinolone cream.  Reports that she has been seen by [place] [facility] for her scalp issues due to her lupus.    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   Asthma    COPD (chronic obstructive pulmonary disease) (HCC)    Depression    Diabetes mellitus    Type II   GERD (gastroesophageal reflux disease)    Pt on Protonix daily   Glaucoma    Gout    Headache(784.0)    otc meds prn   Hyperlipidemia    Hypertension    Irritable bowel syndrome 11/19/2010   Neuropathy    Pneumonia YRS AGO   Restless legs    Shortness of breath    07/14/2018- uses  4 times a day    Past Surgical History:  Procedure Laterality Date   BACK SURGERY     CHOLECYSTECTOMY     COLONOSCOPY     ENDOMETRIAL ABLATION  10/2010   EYE SURGERY     HERNIA REPAIR     umbicial hernia   JOINT REPLACEMENT Left 03/14/2019   Dr. Ninfa Linden hip   KNEE ARTHROSCOPY Left    06/07/2017 Dr. Marlou Sa of [place] ARTHROSCOPY Left 03/21/2020   Procedure: LEFT KNEE ARTHROSCOPY WITH PARTIAL MEDIAL MENISCECTOMY;  Surgeon: Mcarthur Rossetti, MD;  Location: [facility];  Service: Orthopedics;  Laterality: Left;   KNEE CLOSED REDUCTION Right 12/06/2015   Procedure: CLOSED MANIPULATION RIGHT KNEE;  Surgeon: [name], MD;  Location: [facility];  Service:  Orthopedics;  Laterality: Right;   KNEE CLOSED REDUCTION Right 01/17/2016   Procedure: CLOSED MANIPULATION RIGHT KNEE;  Surgeon: [name], MD;  Location: [place];  Service: Orthopedics;  Laterality: Right;   KNEE JOINT MANIPULATION Right 12/06/2015   LACRIMAL TUBE INSERTION Bilateral 03/01/2019   Procedure: LACRIMAL TUBE INSERTION;  Surgeon: Gevena Cotton, MD;  Location: [facility];  Service: Ophthalmology;  Laterality: Bilateral;   LACRIMAL TUBE REMOVAL Bilateral 05/03/2019   Procedure: BILATERAL NASOLACRIMAL DUCT PROBING, IIRIGATION AND TUBE REMOVAL BOTH EYES;  Surgeon: Gevena Cotton, MD;  Location: [place];  Service: Ophthalmology;  Laterality: Bilateral;   LUMBAR DISC SURGERY  06/03/2015   L 2  L3 L4 L5    LUMBAR LAMINECTOMY/DECOMPRESSION MICRODISCECTOMY N/A 06/03/2015   Procedure: Bilateral lateral recess decompression L2-3, L3-4, L4-5;  Surgeon: [name], MD;  Location: [place];  Service: Orthopedics;  Laterality: N/A;   LUMBAR LAMINECTOMY/DECOMPRESSION MICRODISCECTOMY N/A 10/02/2016   Procedure: Right L5-S1 Lateral Recess Decompression  microdiscectomy;  Surgeon: [name], MD;  Location: [place];  Service: Orthopedics;  Laterality: N/A;   LUMBAR LAMINECTOMY/DECOMPRESSION MICRODISCECTOMY N/A 07/15/2018   Procedure: LEFT L3-4 MICRODISCECTOMY;  Surgeon: Basil Dess  E, MD;  Location: [place];  Service: Orthopedics;  Laterality: N/A;   svd      x 2   TEAR DUCT PROBING Bilateral 03/01/2019   Procedure: TEAR DUCT PROBING WITH IRRIGATION;  Surgeon: Gevena Cotton, MD;  Location: [facility];  Service: Ophthalmology;  Laterality: Bilateral;   TOTAL HIP ARTHROPLASTY Left 03/14/2019   Procedure: LEFT TOTAL HIP ARTHROPLASTY ANTERIOR APPROACH;  Surgeon: Mcarthur Rossetti, MD;  Location: [place];  Service: Orthopedics;  Laterality: Left;   TOTAL KNEE ARTHROPLASTY Right 09/06/2015   Procedure: RIGHT TOTAL KNEE ARTHROPLASTY;  Surgeon: [name], MD;  Location: [place];  Service: Orthopedics;  Laterality: Right;   TUBAL LIGATION     UPPER GASTROINTESTINAL ENDOSCOPY  04/28/11    Family History  Problem Relation Age of Onset   Hypertension Father    Cancer Father    Heart disease Mother    Asthma Son        had as a child   Heart disease Sister    Breast cancer Sister    Hypertension Brother     Social History   Socioeconomic History   Marital status: Married    Spouse name: Not on file   Number of children: 2   Years of education: Not on file   Highest education level: Not on file  Occupational History   Occupation: unemployed    Employer: UNEMPLOYED  Tobacco Use   Smoking status: Some Days    Packs/day: 0.25    Years: 32.00    Pack years: 8.00    Types: Cigarettes   Smokeless tobacco: Never   Tobacco comments:    DOWN TO 3 PER DAY  Vaping Use   Vaping Use: Never used  Substance and Sexual Activity   Alcohol use: No   Drug use: No   Sexual activity: Yes    Birth control/protection: Surgical, Post-menopausal    Comment: tubal ligation  Other Topics Concern   Not on file  Social History Narrative   Left Handed    Lives in a two story apartment.    Drinks Caffeine    Social Determinants of Radio broadcast assistant Strain: Not on file  Food Insecurity: Not on file  Transportation Needs: Not on file  Physical Activity: Not on file  Stress: Not on file  Social Connections: Not on file  Intimate Partner Violence: Not on file    Outpatient Medications Prior to Visit  Medication Sig Dispense Refill   ACCU-CHEK AVIVA PLUS test strip USE 3 TIMES A DAY AS DIRECTED BY PHYSICIAN 100 each 12   Accu-Chek Softclix Lancets lancets USE AS INSTRUCTED 3 TIMES A DAY 100 each 12   albuterol (PROVENTIL) (2.5 MG/3ML) 0.083% nebulizer solution USE ONE VIAL (2.5 MG TOTAL) BY NEBULIZATION EVERY 6 (SIX) HOURS AS NEEDED FOR WHEEZING. 300 mL 0   ALLERGY RELIEF 10 MG tablet TAKE 1 TABLET (10 MG TOTAL) BY MOUTH DAILY.  30 tablet 11   allopurinol (ZYLOPRIM) 100 MG tablet TAKE 1 TABLET (100 MG TOTAL) BY MOUTH DAILY. 90 tablet 3   Blood Glucose Monitoring Suppl (ACCU-CHEK AVIVA PLUS) w/Device KIT 1 each by Does not apply route 3 (three) times daily. 1 kit 0   budesonide-formoterol (SYMBICORT) 80-4.5 MCG/ACT inhaler INHALE TWO PUFFS BY MOUTH TWICE A DAY 1 Inhaler 11   celecoxib (CELEBREX) 200 MG capsule Take 200 mg by mouth 2 (two) times daily.     clopidogrel (PLAVIX) 75 MG tablet TAKE  1 TABLET (75 MG TOTAL) BY MOUTH DAILY. 90 tablet 1   diclofenac Sodium (VOLTAREN) 1 % GEL APPLY 4 GRAMS TOPICALLY 4 (FOUR) TIMES DAILY. 400 g 6   dicyclomine (BENTYL) 10 MG capsule Tale one capsule 3 times daily     DULoxetine (CYMBALTA) 60 MG capsule Take 1 capsule (60 mg total) by mouth daily. 30 capsule 3   EPINEPHrine 0.3 mg/0.3 mL IJ SOAJ injection 0.3 mg IM x 1 PRN for allergic reaction (Patient taking differently: Inject 0.3 mg into the muscle once as needed for anaphylaxis.) 1 Device 1   FLOWFLEX COVID-19 AG HOME TEST KIT See admin instructions.     fluticasone (FLONASE) 50 MCG/ACT nasal spray Place 1 spray into both nostrils daily. 16 g 6   gabapentin (NEURONTIN) 600 MG tablet TAKE ONE CAPSULE BY MOUTH THREE TIMES A DAY (AM+NOON+BEDTIME) 90 tablet 1   glimepiride (AMARYL) 2 MG tablet TAKE 1 TABLET (2 MG TOTAL) BY MOUTH DAILY. (AM) 90 tablet 1   hydrochlorothiazide (HYDRODIURIL) 25 MG tablet Take 25 mg by mouth daily.     HYDROcodone-acetaminophen (NORCO/VICODIN) 5-325 MG tablet Take 1-2 tablets by mouth every 6 (six) hours as needed for moderate pain. 30 tablet 0   hydrOXYzine (ATARAX/VISTARIL) 10 MG tablet TAKE 1 TABLET IN THE MORNING AND NOON AND 2 TABLETS IN THE EVENING AS NEEDED. 120 tablet 2   Lancets (ACCU-CHEK SOFT TOUCH) lancets Use as instructed 100 each 12   loperamide (IMODIUM) 2 MG capsule TAKE 1 CAPSULE (2 MG TOTAL) BY MOUTH 4 (FOUR) TIMES DAILY AS NEEDED FOR DIARRHEA OR LOOSE STOOLS. 15 capsule 0   loratadine  (CLARITIN) 10 MG tablet Take 10 mg by mouth daily.     meloxicam (MOBIC) 15 MG tablet Take 1 tablet by mouth daily.     metFORMIN (GLUCOPHAGE-XR) 750 MG 24 hr tablet Take 1 tablet (750 mg total) by mouth 2 (two) times daily. 60 tablet 5   methocarbamol (ROBAXIN) 750 MG tablet TAKE 1 TABLET (750 MG TOTAL) BY MOUTH EVERY 12 (TWELVE) HOURS AS NEEDED FOR MUSCLE SPASMS. 40 tablet 1   metoCLOPramide (REGLAN) 5 MG tablet Take 5 mg by mouth 3 (three) times daily as needed. Take 1 tablet by mouth three times a day before meals as needed for nausea and vomiting.     montelukast (SINGULAIR) 10 MG tablet Take 10 mg by mouth at bedtime.      Multiple Vitamins-Minerals (CENTRUM SILVER 50+WOMEN PO) Take by mouth.     nicotine (NICOTINE STEP 2) 14 mg/24hr patch PLACE 1 PATCH ONTO THE SKIN DAILY 28 patch 1   pantoprazole (PROTONIX) 40 MG tablet Take 1 tablet (40 mg total) by mouth daily. 30 tablet 2   polyethylene glycol (MIRALAX / GLYCOLAX) 17 g packet      potassium chloride SA (KLOR-CON) 20 MEQ tablet TAKE ONE TABLET BY MOUTH ONCE DAILY 90 tablet 1   Respiratory Therapy Supplies (FLUTTER) DEVI Use after breathing treatment 4 times daily 1 each 0   SPIRIVA HANDIHALER 18 MCG inhalation capsule PLACE 1 CAPSULE (18 MCG TOTAL) INTO INHALER AND INHALE DAILY. 30 capsule 2   sucralfate (CARAFATE) 1 g tablet Take 1 g by mouth 4 (four) times daily -  with meals and at bedtime.     valsartan-hydrochlorothiazide (DIOVAN-HCT) 160-12.5 MG tablet TAKE ONE TABLET BY MOUTH ONCE DAILY 90 tablet 1   atorvastatin (LIPITOR) 20 MG tablet TAKE 1 TABLET (20 MG TOTAL) BY MOUTH DAILY. 90 tablet 2   No facility-administered  medications prior to visit.    Allergies  Allergen Reactions   Other Shortness Of Breath    UNSPECIFIED AGENTS Allergic to perfumes and cleaning products   Shellfish Allergy Anaphylaxis    Per allergy test.   Ace Inhibitors Cough and Other (See Comments)        Celecoxib     Other reaction(s): upset  stomach   Shellfish-Derived Products Other (See Comments)   Aspirin Nausea Only    Other reaction(s): stomach upset Other reaction(s): Unknown    ROS Review of Systems  Constitutional: Negative.   HENT: Negative.    Eyes: Negative.   Respiratory:  Negative for shortness of breath.   Cardiovascular:  Negative for chest pain.  Gastrointestinal: Negative.   Endocrine: Negative.   Genitourinary: Negative.   Musculoskeletal: Negative.   Skin: Negative.   Allergic/Immunologic: Negative.   Neurological: Negative.   Hematological: Negative.   Psychiatric/Behavioral: Negative.       Objective:    Physical Exam Vitals and nursing note reviewed.  Constitutional:      Appearance: Normal appearance.  HENT:     Head: Normocephalic and atraumatic.     Right Ear: External ear normal.     Left Ear: External ear normal.     Nose: Nose normal.     Mouth/Throat:     Mouth: Mucous membranes are moist.     Pharynx: Oropharynx is clear.  Eyes:     Extraocular Movements: Extraocular movements intact.     Conjunctiva/sclera: Conjunctivae normal.     Pupils: Pupils are equal, round, and reactive to light.  Cardiovascular:     Rate and Rhythm: Normal rate and regular rhythm.     Pulses: Normal pulses.     Heart sounds: Normal heart sounds.  Pulmonary:     Breath sounds: Normal breath sounds.  Musculoskeletal:        General: Normal range of motion.     Cervical back: Normal range of motion and neck supple.  Skin:    General: Skin is warm and dry.     Comments: See photo   Neurological:     General: No focal deficit present.     Mental Status: She is alert and oriented to person, place, and time.  Psychiatric:        Mood and Affect: Mood normal.        Thought Content: Thought content normal.        Judgment: Judgment normal.       BP 136/80 (BP Location: Right Arm, Patient Position: Sitting, Cuff Size: Normal)   Pulse 72   Temp 98.4 F (36.9 C) (Oral)   Resp 18   Ht 5'  6" (1.676 m)   Wt 167 lb (75.8 kg)   LMP 09/01/2010   SpO2 95%   BMI 26.95 kg/m  Wt Readings from Last 3 Encounters:  08/27/20 167 lb (75.8 kg)  08/21/20 180 lb (81.6 kg)  08/08/20 174 lb (78.9 kg)     Health Maintenance Due  Topic Date Due   Zoster Vaccines- Shingrix (1 of 2) Never done   Pneumococcal Vaccine 63-49 Years old (2 - PCV) 06/03/2016   OPHTHALMOLOGY EXAM  02/06/2020   COVID-19 Vaccine (5 - Booster for Pfizer series) 06/12/2020    There are no preventive care reminders to display for this patient.  Lab Results  Component Value Date   TSH 2.673 03/27/2019   Lab Results  Component Value Date   WBC 7.1 01/15/2020  HGB 12.2 01/15/2020   HCT 36.4 01/15/2020   MCV 84 01/15/2020   PLT 295 01/15/2020   Lab Results  Component Value Date   NA 138 03/18/2020   K 3.7 03/18/2020   CO2 26 03/18/2020   GLUCOSE 169 (H) 03/18/2020   BUN 7 03/18/2020   CREATININE 0.74 03/18/2020   BILITOT 0.2 08/02/2019   ALKPHOS 129 (H) 08/02/2019   AST 17 08/02/2019   ALT 12 08/02/2019   PROT 7.3 08/02/2019   ALBUMIN 4.5 08/02/2019   CALCIUM 9.1 03/18/2020   ANIONGAP 12 03/18/2020   Lab Results  Component Value Date   CHOL 160 01/17/2020   Lab Results  Component Value Date   HDL 43 01/17/2020   Lab Results  Component Value Date   LDLCALC 85 01/17/2020   Lab Results  Component Value Date   TRIG 191 (H) 01/17/2020   Lab Results  Component Value Date   CHOLHDL 3.7 01/17/2020   Lab Results  Component Value Date   HGBA1C 7.4 (A) 06/06/2020      Assessment & Plan:   Problem List Items Addressed This Visit       Other   Lupus ([place])   Relevant Medications   triamcinolone cream (KENALOG) 0.1 %   Other Visit Diagnoses     Fingernail injury, left, initial encounter    -  Primary   Relevant Orders   Ambulatory referral to Dermatology       Meds ordered this encounter  Medications   triamcinolone cream (KENALOG) 0.1 %    Sig: Apply 1 application  topically 2 (two) times daily.    Dispense:  30 g    Refill:  0    Order Specific Question:   Supervising Provider    Answer:   WRIGHT, PATRICK E [[id number]]  1. Fingernail injury, left, initial encounter Patient encouraged to keep area clean and dry, refer to dermatology for further evaluation - Ambulatory referral to Dermatology  2. Lupus ([place]) Refill given - triamcinolone cream (KENALOG) 0.1 %; Apply 1 application topically 2 (two) times daily.  Dispense: 30 g; Refill: 0   I have reviewed the patient's medical history (PMH, PSH, Social History, Family History, Medications, and allergies) , and have been updated if relevant. I spent 23 minutes reviewing chart and  face to face time with patient.   Follow-up: Return if symptoms worsen or fail to improve.    Loraine Grip Mayers, PA-C

## 2020-08-27 NOTE — Patient Instructions (Signed)
I started a referral for you to be seen by dermatology, I do encourage you to call your dermatologist as well to see if you are able to be seen sooner.  Continue to keep nailbed clean and dry, keep covered and use Neosporin.  Please let us know if there is anything else we can do for you  Roney Jaffe, PA-C Physician Assistant [facility] [email or web address]   Nail Bed Injury The nail bed is the soft tissue under a fingernail or toenail that is the origin of new nail growth. Various types of injuries can occur at the nail bed. These injuries may cause: Bruising or bleeding under the nail. Cuts (lacerations) in the nail or nail bed. Loss of a part of the nail or the whole nail. This is called avulsion. In some cases, a nail bed injury happens along with another injury, such as a break (fracture) of the bone at the tip of the finger or toe. The nail bed includes the nail matrix, which is the growth center of the nail. If this growth center is damaged, the injured nail may not grow back normally, or it may not grow at all. The regrown nail might have an abnormal shape or appearance. It can take several months for a damaged or torn-off nail to regrow. Depending on the nature and extent of the nail bed injury, the nail maynever grow back normally. What are the causes? This condition is usually caused by crushing, pinching, cutting, or tearing injuries of the fingertip or toe. These injuries may occur when a finger or toe gets caught in a door, hit by a hammer, or damaged in accidents involvingelectrical tools or power machinery. What are the signs or symptoms? Symptoms vary depending on the type of injury. Symptoms may include: Pain in the injured area. Bleeding. Swelling. Discoloration. Collection of blood under the nail (hematoma). Damage to the nail, such as: A deformed or split nail. A loose nail that is not stuck to the nail  bed. Loss of all or part of the nail. How is this diagnosed? This condition is diagnosed based on: Your medical history and description of how the injury occurred. A physical exam. Your health care provider will check for a loose nail or a laceration of the nail bed. X-rays. These may be done to see if you have a fracture. Your health care provider might also check for conditions that may affecthealing, such as diabetes, nerve problems, or poor circulation. How is this treated? Treatment for this condition may depend on the type of injury. Sometimes, the injury may not require any treatment other than keeping the area clean and free of infection. Treatment may include: Draining a collection of blood from under the nail. This can be done by making a small hole in the nail. Removing all or part of your nail. This might be necessary in order to stitch (suture) any laceration in the nail bed. Stitching a torn-off nail back in place. Depending on the location and size of the nail bed injury, this is sometimes done to provide temporary protection to the nail bed until the new nail grows in. Applying bandages (dressings) or splints to the area. Taking medicines, such as: Antibiotic medicine to help prevent infection. Pain medicine. Having a tetanus shot. You may need a tetanus shot if: You cannot remember when you had your last tetanus shot. You have never had a tetanus shot. The injury broke your skin and you have not had a  tetanus booster during the past 10 years. For certain injuries, your health care provider may tell you to see a hand orfoot specialist. Follow these instructions at home: Managing pain, stiffness, and swelling Raise (elevate) the injured area above the level of your heart while you are sitting or lying down. Keep your injury protected with dressings or splints as told by your health care provider. For an injured toenail: Limit walking on your injured foot. Wear an open-toed  shoe when you walk. Try to avoid letting your leg hang down (dangle) while you are sitting or lying down. Wound care Follow any wound care instructions given by your health care provider. Make sure you: Keep the injured area clean, and keep any dressings clean and dry. Wash your hands with soap and water for at least 20 seconds before and after you change any dressing. If soap and water are not available, use hand sanitizer. Change or remove the dressings only as told by your health care provider. Leave any sutures in place. These may need to stay in place for 2 weeks. Check the injured area every day for signs of infection. Check for: More redness, swelling, or pain. More fluid or blood. Warmth. Pus or a bad smell. General instructions Take over-the-counter and prescription medicines only as told by your health care provider. If you were prescribed an antibiotic medicine, use it as told by your health care provider. Do not stop using the antibiotic even if you start to feel better. Ask your health care provider if the medicine prescribed to you requires you to avoid driving or using machinery. Keep all follow-up visits as told by your health care provider. This is important. Contact a health care provider if: You have pain that is not controlled with medicine. You have more redness, swelling, or pain in the injured area. You have more fluid or blood coming from the injured area. The injured area feels warm to the touch. You have pus or a bad smell coming from the injured area. You have a fever and your symptoms get worse. Get help right away if: You have numbness or your finger or toe turns blue. Summary The nail bed is the soft tissue under a fingernail or toenail that includes the nail matrix, or growth center of the nail. If the growth center is damaged, the injured nail may not grow back normally, or it may not grow at all. Raise (elevate) the injured area above the level of your  heart while you are sitting or lying down. Keep any dressings clean and dry. Change or remove your dressings only as told by your health care provider. Take over-the-counter and prescription medicines only as told by your health care provider. This information is not intended to replace advice given to you by your health care provider. Make sure you discuss any questions you have with your healthcare provider. Document Revised: 11/04/2018 Document Reviewed: 11/04/2018 Elsevier Patient Education  2022 [employer].

## 2020-08-28 ENCOUNTER — Encounter: Payer: Self-pay | Admitting: Physician Assistant

## 2020-08-28 DIAGNOSIS — F331 Major depressive disorder, recurrent, moderate: Secondary | ICD-10-CM | POA: Diagnosis not present

## 2020-09-02 DIAGNOSIS — J3081 Allergic rhinitis due to animal (cat) (dog) hair and dander: Secondary | ICD-10-CM | POA: Diagnosis not present

## 2020-09-02 DIAGNOSIS — J449 Chronic obstructive pulmonary disease, unspecified: Secondary | ICD-10-CM | POA: Diagnosis not present

## 2020-09-02 DIAGNOSIS — J301 Allergic rhinitis due to pollen: Secondary | ICD-10-CM | POA: Diagnosis not present

## 2020-09-02 DIAGNOSIS — G4733 Obstructive sleep apnea (adult) (pediatric): Secondary | ICD-10-CM | POA: Diagnosis not present

## 2020-09-02 DIAGNOSIS — J3089 Other allergic rhinitis: Secondary | ICD-10-CM | POA: Diagnosis not present

## 2020-09-04 DIAGNOSIS — F331 Major depressive disorder, recurrent, moderate: Secondary | ICD-10-CM | POA: Diagnosis not present

## 2020-09-09 DIAGNOSIS — J301 Allergic rhinitis due to pollen: Secondary | ICD-10-CM | POA: Diagnosis not present

## 2020-09-09 DIAGNOSIS — J3081 Allergic rhinitis due to animal (cat) (dog) hair and dander: Secondary | ICD-10-CM | POA: Diagnosis not present

## 2020-09-09 DIAGNOSIS — J3089 Other allergic rhinitis: Secondary | ICD-10-CM | POA: Diagnosis not present

## 2020-09-11 DIAGNOSIS — F331 Major depressive disorder, recurrent, moderate: Secondary | ICD-10-CM | POA: Diagnosis not present

## 2020-09-13 DIAGNOSIS — M67441 Ganglion, right hand: Secondary | ICD-10-CM | POA: Diagnosis not present

## 2020-09-13 DIAGNOSIS — L930 Discoid lupus erythematosus: Secondary | ICD-10-CM | POA: Diagnosis not present

## 2020-09-13 DIAGNOSIS — L720 Epidermal cyst: Secondary | ICD-10-CM | POA: Diagnosis not present

## 2020-09-13 DIAGNOSIS — M67442 Ganglion, left hand: Secondary | ICD-10-CM | POA: Diagnosis not present

## 2020-09-16 DIAGNOSIS — J3081 Allergic rhinitis due to animal (cat) (dog) hair and dander: Secondary | ICD-10-CM | POA: Diagnosis not present

## 2020-09-16 DIAGNOSIS — J301 Allergic rhinitis due to pollen: Secondary | ICD-10-CM | POA: Diagnosis not present

## 2020-09-16 DIAGNOSIS — J3089 Other allergic rhinitis: Secondary | ICD-10-CM | POA: Diagnosis not present

## 2020-09-17 ENCOUNTER — Other Ambulatory Visit: Payer: Self-pay

## 2020-09-17 ENCOUNTER — Ambulatory Visit: Payer: Medicaid Other | Admitting: Neurology

## 2020-09-17 DIAGNOSIS — G5601 Carpal tunnel syndrome, right upper limb: Secondary | ICD-10-CM

## 2020-09-17 DIAGNOSIS — R202 Paresthesia of skin: Secondary | ICD-10-CM | POA: Diagnosis not present

## 2020-09-17 NOTE — Procedures (Signed)
[facility]  [address] [place], [address]  [place], [place] [address] Tel: [phone] Fax:  [phone] Test Date:  09/17/2020  Patient: Stacy [name] DOB: 21-Sep-1960 Physician: Nita Sickle, DO  Sex: Female Height: 5\' 6"  Ref Phys: , DO  ID#: Nita Sickle   Technician:    Patient Complaints: This is a 60 year old female referred for evaluation of bilateral hand paresthesias.  NCV & EMG Findings: Extensive electrodiagnostic testing of the right upper extremity and additional studies of the left shows:  Bilateral median, bilateral ulnar, and left mixed palmar sensory responses are within normal limits.  Right mixed, sensory response shows prolonged latency (Median Palm-Ulnar Palm, 0.9 ms).   Bilateral median and ulnar motor responses are within normal limits.   There is no evidence of active or chronic motor axonal loss changes affecting any of the tested muscles.  Motor unit configuration and recruitment pattern is within normal limits.    Impression: Right median neuropathy at or distal to the wrist (mild), consistent with a clinical diagnosis of carpal tunnel syndrome.     ___________________________ 66, DO    Nerve Conduction Studies Anti Sensory Summary Table   Stim Site NR Peak (ms) Norm Peak (ms) P-T Amp (V) Norm P-T Amp  Left Median Anti Sensory (2nd Digit)  36C  Wrist    2.7 <3.8 25.2 >10  Right Median Anti Sensory (2nd Digit)  36C  Wrist    3.0 <3.8 27.9 >10  Left Ulnar Anti Sensory (5th Digit)  36C  Wrist    3.0 <3.2 18.1 >5  Right Ulnar Anti Sensory (5th Digit)  36C  Wrist    2.8 <3.2 15.9 >5   Motor Summary Table   Stim Site NR Onset (ms) Norm Onset (ms) O-P Amp (mV) Norm O-P Amp Site1 Site2 Delta-0 (ms) Dist (cm) Vel (m/s) Norm Vel (m/s)  Left Median Motor (Abd Poll Brev)  36C  Wrist    2.7 <4.0 11.6 >5 Elbow Wrist 5.2 28.0 54 >50  Elbow    7.9  10.8         Right Median Motor (Abd Poll Brev)  36C  Wrist    2.9 <4.0  10.7 >5 Elbow Wrist 4.6 26.0 57 >50  Elbow    7.5  10.7         Left Ulnar Motor (Abd Dig Minimi)  36C  Wrist    2.8 <3.1 10.4 >7 B Elbow Wrist 3.6 23.0 64 >50  B Elbow    6.4  9.1  A Elbow B Elbow 1.7 10.0 59 >50  A Elbow    8.1  9.0         Right Ulnar Motor (Abd Dig Minimi)  36C  Wrist    1.9 <3.1 10.2 >7 B Elbow Wrist 4.2 22.0 52 >50  B Elbow    6.1  9.0  A Elbow B Elbow 1.9 10.0 53 >50  A Elbow    8.0  8.4          Comparison Summary Table   Stim Site NR Peak (ms) Norm Peak (ms) P-T Amp (V) Site1 Site2 Delta-P (ms) Norm Delta (ms)  Left Median/Ulnar Palm Comparison (Wrist - 8cm)  36C  Median Palm    1.7 <2.2 48.1 Median Palm Ulnar Palm 0.1   Ulnar Palm    1.6 <2.2 9.5      Right Median/Ulnar Palm Comparison (Wrist - 8cm)  36C  Median Palm    2.5 <2.2 18.6 Median  Palm Ulnar Palm 0.9   Ulnar Palm    1.6 <2.2 8.7       EMG   Side Muscle Ins Act Fibs Psw Fasc Number Recrt Dur Dur. Amp Amp. Poly Poly. Comment  Right 1stDorInt Nml Nml Nml Nml Nml Nml Nml Nml Nml Nml Nml Nml N/A  Right Abd Poll Brev Nml Nml Nml Nml Nml Nml Nml Nml Nml Nml Nml Nml N/A  Right PronatorTeres Nml Nml Nml Nml Nml Nml Nml Nml Nml Nml Nml Nml N/A  Right Biceps Nml Nml Nml Nml Nml Nml Nml Nml Nml Nml Nml Nml N/A  Right Triceps Nml Nml Nml Nml Nml Nml Nml Nml Nml Nml Nml Nml N/A  Right Deltoid Nml Nml Nml Nml Nml Nml Nml Nml Nml Nml Nml Nml N/A  Left 1stDorInt Nml Nml Nml Nml Nml Nml Nml Nml Nml Nml Nml Nml N/A  Left Abd Poll Brev Nml Nml Nml Nml Nml Nml Nml Nml Nml Nml Nml Nml N/A  Left PronatorTeres Nml Nml Nml Nml Nml Nml Nml Nml Nml Nml Nml Nml N/A  Left Biceps Nml Nml Nml Nml Nml Nml Nml Nml Nml Nml Nml Nml N/A  Left Triceps Nml Nml Nml Nml Nml Nml Nml Nml Nml Nml Nml Nml N/A  Left Deltoid Nml Nml Nml Nml Nml Nml Nml Nml Nml Nml Nml Nml N/A      Waveforms:

## 2020-09-23 DIAGNOSIS — J3089 Other allergic rhinitis: Secondary | ICD-10-CM | POA: Diagnosis not present

## 2020-09-23 DIAGNOSIS — J301 Allergic rhinitis due to pollen: Secondary | ICD-10-CM | POA: Diagnosis not present

## 2020-09-23 DIAGNOSIS — J3081 Allergic rhinitis due to animal (cat) (dog) hair and dander: Secondary | ICD-10-CM | POA: Diagnosis not present

## 2020-09-24 NOTE — Progress Notes (Signed)
60 year old female returns with complaints of left leg pain and weakness.  States that symptoms unchanged from her last office visit with Dr. [name].  He has referred her to neurology and also to Dr. Alvester Morin.  Has seen neurology but appointment with Dr. Alvester Morin is not until August 24.  Plan Today advised patient not much can be done online and at today's visit.  I recommend that she proceed with appointment with Dr. Alvester Morin as scheduled and then follow-up with Dr. [name] after.  All questions answered.

## 2020-09-25 ENCOUNTER — Telehealth: Payer: Self-pay

## 2020-09-25 ENCOUNTER — Other Ambulatory Visit: Payer: Self-pay | Admitting: Physical Medicine & Rehabilitation

## 2020-09-25 ENCOUNTER — Other Ambulatory Visit: Payer: Self-pay

## 2020-09-25 ENCOUNTER — Ambulatory Visit (INDEPENDENT_AMBULATORY_CARE_PROVIDER_SITE_OTHER): Payer: Medicaid Other | Admitting: Physical Medicine and Rehabilitation

## 2020-09-25 DIAGNOSIS — R202 Paresthesia of skin: Secondary | ICD-10-CM | POA: Diagnosis not present

## 2020-09-25 DIAGNOSIS — R531 Weakness: Secondary | ICD-10-CM

## 2020-09-25 DIAGNOSIS — F331 Major depressive disorder, recurrent, moderate: Secondary | ICD-10-CM | POA: Diagnosis not present

## 2020-09-25 NOTE — Telephone Encounter (Signed)
Pt called and would like a refill on gabapentin please advise

## 2020-09-25 NOTE — Telephone Encounter (Signed)
Looks like this is pending with Dr. Ammie Dalton.

## 2020-09-25 NOTE — Progress Notes (Signed)
Left leg "has been going out." Feels numb sometimes- down outside of leg. Some pain in leg. No lotion per patient.

## 2020-09-26 ENCOUNTER — Other Ambulatory Visit: Payer: Self-pay | Admitting: Internal Medicine

## 2020-09-26 ENCOUNTER — Other Ambulatory Visit: Payer: Self-pay | Admitting: Physical Medicine & Rehabilitation

## 2020-09-26 ENCOUNTER — Other Ambulatory Visit (INDEPENDENT_AMBULATORY_CARE_PROVIDER_SITE_OTHER): Payer: Self-pay | Admitting: Specialist

## 2020-09-26 DIAGNOSIS — I10 Essential (primary) hypertension: Secondary | ICD-10-CM

## 2020-09-26 DIAGNOSIS — M109 Gout, unspecified: Secondary | ICD-10-CM

## 2020-09-26 DIAGNOSIS — I739 Peripheral vascular disease, unspecified: Secondary | ICD-10-CM

## 2020-09-26 NOTE — Telephone Encounter (Signed)
  Notes to clinic:  Medication filled today for 30 day supply Follow up due on 10/07/2020 Review for future refills   Requested Prescriptions  Pending Prescriptions Disp Refills   potassium chloride SA (KLOR-CON) 20 MEQ tablet [Pharmacy Med Name: POTASSIUM CHLORIDE CRYS ER 20 MEQ ORAL TABLET EXTENDED RELEASE] 90 tablet 0    Sig: TAKE ONE TABLET BY MOUTH ONCE DAILY (NOON)     Endocrinology:  Minerals - Potassium Supplementation Passed - 09/26/2020 11:58 AM      Passed - K in normal range and within 360 days    Potassium  Date Value Ref Range Status  03/18/2020 3.7 3.5 - 5.1 mmol/L Final          Passed - Cr in normal range and within 360 days    Creat  Date Value Ref Range Status  03/11/2016 0.90 0.50 - 1.05 mg/dL Final    Comment:      For patients > or = 61 years of age: The upper reference limit for Creatinine is approximately 13% higher for people identified as African-American.      Creatinine, Ser  Date Value Ref Range Status  03/18/2020 0.74 0.44 - 1.00 mg/dL Final   Creatinine, POC  Date Value Ref Range Status  06/10/2016 200 mg/dL Final          Passed - Valid encounter within last 12 months    Recent Outpatient Visits           3 months ago Type 2 diabetes mellitus with diabetic polyneuropathy, without long-term current use of insulin (HCC)   [place] [facility] [place], Gavin Pound B, MD   8 months ago Type 2 diabetes mellitus with diabetic polyneuropathy, without long-term current use of insulin (HCC)   [place] [facility] Marcine Matar, MD   10 months ago Need for influenza vaccination   [facility] Drucilla Chalet, RPH-CPP   1 year ago Type 2 diabetes mellitus with diabetic polyneuropathy, without long-term current use of insulin [facility])   [place] [facility] Marcine Matar, MD   1 year ago Coarse tremors   [place] [facility]  Marcine Matar, MD       Future Appointments             In 1 week [name], Guy Sandifer, MD [facility]

## 2020-09-26 NOTE — Telephone Encounter (Signed)
Requested medication (s) are due for refill today: no  Requested medication (s) are on the active medication list: yes  Last refill: 09/26/2020  Future visit scheduled: no  Notes to clinic:  Failed protocol: Evaluate AST, ALT within 2 months of therapy initiation.   ALT in normal range and within 360 days   AST in normal range and within 360 days   HCT in normal range and within 180 days   HGB in normal range and within 180 days   PLT in normal range and within 180 days     Requested Prescriptions  Pending Prescriptions Disp Refills   clopidogrel (PLAVIX) 75 MG tablet [Pharmacy Med Name: CLOPIDOGREL BISULFATE 75 MG ORAL TABLET] 90 tablet 1    Sig: TAKE 1 TABLET (75 MG TOTAL) BY MOUTH DAILY. (AM)     Hematology: Antiplatelets - clopidogrel Failed - 09/26/2020 11:58 AM      Failed - Evaluate AST, ALT within 2 months of therapy initiation.      Failed - ALT in normal range and within 360 days    ALT  Date Value Ref Range Status  08/02/2019 12 0 - 32 IU/L Final          Failed - AST in normal range and within 360 days    AST  Date Value Ref Range Status  08/02/2019 17 0 - 40 IU/L Final          Failed - HCT in normal range and within 180 days    Hematocrit  Date Value Ref Range Status  01/15/2020 36.4 34.0 - 46.6 % Final          Failed - HGB in normal range and within 180 days    Hemoglobin  Date Value Ref Range Status  01/15/2020 12.2 11.1 - 15.9 g/dL Final          Failed - PLT in normal range and within 180 days    Platelets  Date Value Ref Range Status  01/15/2020 295 150 - 450 x10E3/uL Final          Passed - Valid encounter within last 6 months    Recent Outpatient Visits           3 months ago Type 2 diabetes mellitus with diabetic polyneuropathy, without long-term current use of insulin (HCC)   [place] [facility] [place], Gavin Pound B, MD   8 months ago Type 2 diabetes mellitus with diabetic polyneuropathy, without long-term  current use of insulin (HCC)   [place] [facility] Marcine Matar, MD   10 months ago Need for influenza vaccination   [facility] Drucilla Chalet, RPH-CPP   1 year ago Type 2 diabetes mellitus with diabetic polyneuropathy, without long-term current use of insulin (HCC)   [place] [facility] Marcine Matar, MD   1 year ago Coarse tremors    [facility] Marcine Matar, MD       Future Appointments             In 1 week Kerrin Champagne, MD [facility] [place]             valsartan-hydrochlorothiazide (DIOVAN-HCT) 160-12.5 MG tablet [Pharmacy Med Name: VALSARTAN-HYDROCHLOROTHIAZIDE 160-12.5 MG ORAL TABLET] 90 tablet 1    Sig: TAKE ONE TABLET BY MOUTH ONCE DAILY (AM)     Cardiovascular: ARB + Diuretic Combos Failed - 09/26/2020 11:58 AM  Failed - K in normal range and within 180 days    Potassium  Date Value Ref Range Status  03/18/2020 3.7 3.5 - 5.1 mmol/L Final          Failed - Na in normal range and within 180 days    Sodium  Date Value Ref Range Status  03/18/2020 138 135 - 145 mmol/L Final  03/02/2019 141 134 - 144 mmol/L Final          Failed - Cr in normal range and within 180 days    Creat  Date Value Ref Range Status  03/11/2016 0.90 0.50 - 1.05 mg/dL Final    Comment:      For patients > or = 61 years of age: The upper reference limit for Creatinine is approximately 13% higher for people identified as African-American.      Creatinine, Ser  Date Value Ref Range Status  03/18/2020 0.74 0.44 - 1.00 mg/dL Final   Creatinine, POC  Date Value Ref Range Status  06/10/2016 200 mg/dL Final          Failed - Ca in normal range and within 180 days    Calcium  Date Value Ref Range Status  03/18/2020 9.1 8.9 - 10.3 mg/dL Final   Calcium, Ion  Date Value Ref Range Status  03/01/2019 1.22 1.15 - 1.40 mmol/L Final           Passed - Patient is not pregnant      Passed - Last BP in normal range    BP Readings from Last 1 Encounters:  08/27/20 136/80          Passed - Valid encounter within last 6 months    Recent Outpatient Visits           3 months ago Type 2 diabetes mellitus with diabetic polyneuropathy, without long-term current use of insulin (HCC)   [place] [facility] [place], Gavin Pound B, MD   8 months ago Type 2 diabetes mellitus with diabetic polyneuropathy, without long-term current use of insulin (HCC)   [place] [facility] Marcine Matar, MD   10 months ago Need for influenza vaccination   [facility] Drucilla Chalet, RPH-CPP   1 year ago Type 2 diabetes mellitus with diabetic polyneuropathy, without long-term current use of insulin [facility])   [place] [facility] Marcine Matar, MD   1 year ago Coarse tremors   [place] [facility] Marcine Matar, MD       Future Appointments             In 1 week [name], Guy Sandifer, MD [facility]

## 2020-09-27 ENCOUNTER — Other Ambulatory Visit: Payer: Self-pay | Admitting: Specialist

## 2020-09-27 ENCOUNTER — Other Ambulatory Visit: Payer: Self-pay | Admitting: Internal Medicine

## 2020-09-27 DIAGNOSIS — F17200 Nicotine dependence, unspecified, uncomplicated: Secondary | ICD-10-CM

## 2020-09-27 NOTE — Telephone Encounter (Signed)
Please advise 

## 2020-09-27 NOTE — Procedures (Signed)
EMG & NCV Findings: Evaluation of the left fibular motor, the left superficial fibular sensory, and the left sural sensory nerves showed reduced amplitude (L1.3, L0.0, L1.7 V).  All remaining nerves (as indicated in the following tables) were within normal limits.    All examined muscles (as indicated in the following table) showed no evidence of electrical instability.    Impression: Essentially NORMAL electrodiagnostic study of the left lower limb.  There is no significant electrodiagnostic evidence of nerve entrapment, lumbar radiculopathy or lumbosacral plexopathy.    As you know, purely sensory or demyelinating radiculopathies and chemical radiculitis may not be detected with this particular electrodiagnostic study.  Recommendations: 1.  Follow-up with referring physician. 2.  Continue current management of symptoms.  ___________________________ Naaman Plummer FAAPMR Board Certified, American Board of Physical Medicine and Rehabilitation    Nerve Conduction Studies Anti Sensory Summary Table   Stim Site NR Peak (ms) Norm Peak (ms) P-T Amp (V) Norm P-T Amp Site1 Site2 Delta-P (ms) Dist (cm) Vel (m/s) Norm Vel (m/s)  Left Saphenous Anti Sensory (Ant Med Mall)  29.7C  14cm    3.7 <4.4 16.6 >2 14cm Ant Med Mall 3.7 0.0  >32  Left Sup Fibular Anti Sensory (Ant Lat Mall)  29.7C  14 cm    3.7 <4.4 *0.0 >5.0 14 cm Ant Lat Mall 3.7 14.0 38 >32  Left Sural Anti Sensory (Lat Mall)  29.6C  Calf    3.6 <4.0 *1.7 >5.0 Calf Lat Mall 3.6 14.0 39 >35   Motor Summary Table   Stim Site NR Onset (ms) Norm Onset (ms) O-P Amp (mV) Norm O-P Amp Site1 Site2 Delta-0 (ms) Dist (cm) Vel (m/s) Norm Vel (m/s)  Left Fibular Motor (Ext Dig Brev)  29.7C  Ankle    3.4 <6.1 *1.3 >2.5 B Fib Ankle 6.7 35.0 52 >38  B Fib    10.1  7.1  Poplt B Fib 1.3 8.0 62 >40  Poplt    11.4  6.9         Left Tibial Motor (Abd Hall Brev)  29.9C  Ankle    4.1 <6.1 4.4 >3.0 Knee Ankle 7.9 34.0 43 >35  Knee    12.0  4.2           EMG   Side Muscle Nerve Root Ins Act Fibs Psw Amp Dur Poly Recrt Int Dennie Bible Comment  Left AntTibialis Dp Br Peron L4-5 Nml Nml Nml Nml Nml 0 Nml Nml   Left Fibularis Longus  Sup Br Peron L5-S1 Nml Nml Nml Nml Nml 0 Nml Nml   Left MedGastroc Tibial S1-2 Nml Nml Nml Nml Nml 0 Nml Nml   Left VastusMed Femoral L2-4 Nml Nml Nml Nml Nml 0 Nml Nml   Left BicepsFemS Sciatic L5-S1 Nml Nml Nml Nml Nml 0 Nml Nml     Nerve Conduction Studies Anti Sensory Left/Right Comparison   Stim Site L Lat (ms) R Lat (ms) L-R Lat (ms) L Amp (V) R Amp (V) L-R Amp (%) Site1 Site2 L Vel (m/s) R Vel (m/s) L-R Vel (m/s)  Saphenous Anti Sensory (Ant Med Mall)  29.7C  14cm 3.7   16.6   14cm Ant Med Mall     Sup Fibular Anti Sensory (Ant Lat Mall)  29.7C  14 cm 3.7   *0.0   14 cm Ant Lat Mall 38    Sural Anti Sensory (Lat Mall)  29.6C  Calf 3.6   *1.7   Calf Lat Mall 39  Motor Left/Right Comparison   Stim Site L Lat (ms) R Lat (ms) L-R Lat (ms) L Amp (mV) R Amp (mV) L-R Amp (%) Site1 Site2 L Vel (m/s) R Vel (m/s) L-R Vel (m/s)  Fibular Motor (Ext Dig Brev)  29.7C  Ankle 3.4   *1.3   B Fib Ankle 52    B Fib 10.1   7.1   Poplt B Fib 62    Poplt 11.4   6.9         Tibial Motor (Abd Hall Brev)  29.9C  Ankle 4.1   4.4   Knee Ankle 43    Knee 12.0   4.2            Waveforms:

## 2020-09-27 NOTE — Progress Notes (Signed)
Stacy [name] - 60 y.o. female MRN [id number]  Date of birth: [birth year]  Office Visit Note: Visit Date: 09/25/2020 PCP: Marcine Matar, MD Referred by: Marcine Matar, MD  Subjective: Chief Complaint  Patient presents with   Left Leg - Pain, Numbness   HPI:  Stacy [name] is a 60 y.o. female who comes in today at the request of Dr. Vira [name] for electrodiagnostic study of the Left lower extremities.  She reports intermittent giveaway weakness of the left leg.  She does not report distal or focal weakness.  She reports intermittent numbness mostly on the outside of the leg and somewhat of an L5 dermatome if it is dermatomal.  She does get some pain in the leg.  She has had a prior right-sided electrodiagnostic study of the lower limb by myself in 2019 which was essentially normal.  She has had a very recent electrodiagnostic study of the right upper limb by Dr. Nita Sickle at [facility] neurology.  This essentially showed mild median neuropathy at the wrist.  Dr. Barbaraann Faster main question was L3 or L4 radiculopathy.  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  ROS Otherwise per HPI.  Assessment & Plan: Visit Diagnoses:    ICD-10-CM   1. Paresthesia of skin  R20.2 NCV with EMG (electromyography)    2. Weakness  R53.1 NCV with EMG (electromyography)      Plan: Impression: Essentially NORMAL electrodiagnostic study of the left lower limb.  There is no significant electrodiagnostic evidence of nerve entrapment, lumbar radiculopathy or lumbosacral plexopathy.     As you know, purely sensory or demyelinating radiculopathies and chemical radiculitis may not be detected with this particular electrodiagnostic study.   Recommendations: 1.  Follow-up with referring physician. 2.  Continue current management of symptoms  Meds & Orders: No orders of the defined types were placed in this encounter.   Orders Placed This Encounter  Procedures   NCV with EMG  (electromyography)    Follow-up: Return in about 2 weeks (around 10/09/2020) for Stacy Browns, MD.   Procedures: No procedures performed  EMG & NCV Findings: Evaluation of the left fibular motor, the left superficial fibular sensory, and the left sural sensory nerves showed reduced amplitude (L1.3, L0.0, L1.7 V).  All remaining nerves (as indicated in the following tables) were within normal limits.    All examined muscles (as indicated in the following table) showed no evidence of electrical instability.    Impression: Essentially NORMAL electrodiagnostic study of the left lower limb.  There is no significant electrodiagnostic evidence of nerve entrapment, lumbar radiculopathy or lumbosacral plexopathy.    As you know, purely sensory or demyelinating radiculopathies and chemical radiculitis may not be detected with this particular electrodiagnostic study.  Recommendations: 1.  Follow-up with referring physician. 2.  Continue current management of symptoms.  ___________________________ Naaman Plummer FAAPMR Board Certified, American Board of Physical Medicine and Rehabilitation    Nerve Conduction Studies Anti Sensory Summary Table   Stim Site NR Peak (ms) Norm Peak (ms) P-T Amp (V) Norm P-T Amp Site1 Site2 Delta-P (ms) Dist (cm) Vel (m/s) Norm Vel (m/s)  Left Saphenous Anti Sensory (Ant Med Mall)  29.7C  14cm    3.7 <4.4 16.6 >2 14cm Ant Med Mall 3.7 0.0  >32  Left Sup Fibular Anti Sensory (Ant Lat Mall)  29.7C  14 cm    3.7 <4.4 *0.0 >5.0 14 cm Ant Lat Mall 3.7 14.0 38 >32  Left Sural Anti  Sensory (Lat Mall)  29.6C  Calf    3.6 <4.0 *1.7 >5.0 Calf Lat Mall 3.6 14.0 39 >35   Motor Summary Table   Stim Site NR Onset (ms) Norm Onset (ms) O-P Amp (mV) Norm O-P Amp Site1 Site2 Delta-0 (ms) Dist (cm) Vel (m/s) Norm Vel (m/s)  Left Fibular Motor (Ext Dig Brev)  29.7C  Ankle    3.4 <6.1 *1.3 >2.5 B Fib Ankle 6.7 35.0 52 >38  B Fib    10.1  7.1  Poplt B Fib 1.3 8.0 62 >40  Poplt     11.4  6.9         Left Tibial Motor (Abd Hall Brev)  29.9C  Ankle    4.1 <6.1 4.4 >3.0 Knee Ankle 7.9 34.0 43 >35  Knee    12.0  4.2          EMG   Side Muscle Nerve Root Ins Act Fibs Psw Amp Dur Poly Recrt Int Dennie Bible Comment  Left AntTibialis Dp Br Peron L4-5 Nml Nml Nml Nml Nml 0 Nml Nml   Left Fibularis Longus  Sup Br Peron L5-S1 Nml Nml Nml Nml Nml 0 Nml Nml   Left MedGastroc Tibial S1-2 Nml Nml Nml Nml Nml 0 Nml Nml   Left VastusMed Femoral L2-4 Nml Nml Nml Nml Nml 0 Nml Nml   Left BicepsFemS Sciatic L5-S1 Nml Nml Nml Nml Nml 0 Nml Nml     Nerve Conduction Studies Anti Sensory Left/Right Comparison   Stim Site L Lat (ms) R Lat (ms) L-R Lat (ms) L Amp (V) R Amp (V) L-R Amp (%) Site1 Site2 L Vel (m/s) R Vel (m/s) L-R Vel (m/s)  Saphenous Anti Sensory (Ant Med Mall)  29.7C  14cm 3.7   16.6   14cm Ant Med Mall     Sup Fibular Anti Sensory (Ant Lat Mall)  29.7C  14 cm 3.7   *0.0   14 cm Ant Lat Mall 38    Sural Anti Sensory (Lat Mall)  29.6C  Calf 3.6   *1.7   Calf Lat Mall 39     Motor Left/Right Comparison   Stim Site L Lat (ms) R Lat (ms) L-R Lat (ms) L Amp (mV) R Amp (mV) L-R Amp (%) Site1 Site2 L Vel (m/s) R Vel (m/s) L-R Vel (m/s)  Fibular Motor (Ext Dig Brev)  29.7C  Ankle 3.4   *1.3   B Fib Ankle 52    B Fib 10.1   7.1   Poplt B Fib 62    Poplt 11.4   6.9         Tibial Motor (Abd Hall Brev)  29.9C  Ankle 4.1   4.4   Knee Ankle 43    Knee 12.0   4.2            Waveforms:            Clinical History: 09/17/20 EMG/NCS NCV & EMG Findings: Extensive electrodiagnostic testing of the right upper extremity and additional studies of the left shows:  1. Bilateral median, bilateral ulnar, and left mixed palmar sensory responses are within normal limits.  Right mixed, sensory response shows prolonged latency (Median Palm-Ulnar Palm, 0.9 ms).   2. Bilateral median and ulnar motor responses are within normal limits.   3. There is no evidence of active or chronic  motor axonal loss changes affecting any of the tested muscles.  Motor unit configuration and recruitment pattern is within normal limits.  Impression: Right median neuropathy at or distal to the wrist (mild), consistent with a clinical diagnosis of carpal tunnel syndrome.       ___________________________ Nita Sickle, DO -- MRI IIMPRESSION: 1. 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 2. Prior laminectomy changes at the L3-4 through L5-S1 levels. No residual canal stenosis at these levels. 3. Similar degree of moderate bilateral foraminal stenosis at L4-5 and on the right at L5-S1.     Electronically Signed   By: Duanne Guess D.O.   On: 08/30/2019 17:02  ---- 09/30/17 EMG/NCS  Impression: Essentially NORMAL electrodiagnostic study of the right lower limb.  Nondiagnostic by criteria but some evidence of chronic very mild L3 or L4 radiculopathy.  Clinically she has more pain with movement and positioning of the leg that does not seem to fit with radiculopathy.   There is no significant electrodiagnostic evidence of nerve entrapment, lumbosacral plexopathy or lumbar radiculopathy.     As you know, purely sensory or demyelinating radiculopathies and chemical radiculitis may not be detected with this particular electrodiagnostic study.   Recommendations: 1.  Follow-up with referring physician. 2.  Continue current management of symptoms.  Could consider radiofrequency ablation of the knee which is a procedure that has gained some ground over the last few years.  This can be tried with conventional radiofrequency ablation or if there is a practitioner in the area that uses cooled RF.   ___________________________ Naaman Plummer FAAPMR     Objective:  VS:  HT:    WT:   BMI:     BP:   HR: bpm  TEMP: ( )  RESP:  Physical Exam Musculoskeletal:        General: Tenderness present. No swelling or deformity.     Right  lower leg: No edema.     Left lower leg: No edema.     Comments: Inspection reveals no atrophy of the bilateral intrinsic foot musculature or calf musculature.. There is no swelling, color changes, allodynia or dystrophic changes. There is 5 out of 5 strength in the bilateral knee flexion and extension as well as dorsiflexion, plantarflexion EHL.. There is intact sensation to light touch in all dermatomal and peripheral nerve distributions.   Skin:    General: Skin is warm and dry.     Findings: No erythema or rash.  Neurological:     General: No focal deficit present.     Mental Status: She is alert and oriented to person, place, and time.     Sensory: No sensory deficit.     Motor: No weakness or abnormal muscle tone.     Coordination: Coordination normal.     Gait: Gait normal.  Psychiatric:        Mood and Affect: Mood normal.        Behavior: Behavior normal.     Imaging: No results found.

## 2020-09-28 NOTE — Telephone Encounter (Signed)
Requested Prescriptions  Pending Prescriptions Disp Refills  . nicotine (NICODERM CQ - DOSED IN MG/24 HOURS) 14 mg/24hr patch [Pharmacy Med Name: NICOTINE 14 MG/24HR TRANSDERMAL PATCH 24 HOUR] 28 patch 1    Sig: PLACE 1 PATCH ONTO THE SKIN DAILY     Psychiatry:  Drug Dependence Therapy Passed - 09/27/2020 11:48 AM      Passed - Valid encounter within last 12 months    Recent Outpatient Visits          3 months ago Type 2 diabetes mellitus with diabetic polyneuropathy, without long-term current use of insulin (HCC)   [place] [facility] [place], Gavin Pound B, MD   8 months ago Type 2 diabetes mellitus with diabetic polyneuropathy, without long-term current use of insulin (HCC)   [place] [facility] Marcine Matar, MD   10 months ago Need for influenza vaccination   [facility] Drucilla Chalet, RPH-CPP   1 year ago Type 2 diabetes mellitus with diabetic polyneuropathy, without long-term current use of insulin [facility])   [place] [facility] Marcine Matar, MD   1 year ago Coarse tremors   [facility] Marcine Matar, MD      Future Appointments            In 5 days [name], Guy Sandifer, MD [facility]           . SPIRIVA HANDIHALER 18 MCG inhalation capsule [Pharmacy Med Name: SPIRIVA HANDIHALER 18 MCG INHALATION CAPSULE] 30 capsule 2    Sig: PLACE 1 CAPSULE (18 MCG TOTAL) INTO INHALER AND INHALE DAILY.     Pulmonology:  Anticholinergic Agents Passed - 09/27/2020 11:48 AM      Passed - Valid encounter within last 12 months    Recent Outpatient Visits          3 months ago Type 2 diabetes mellitus with diabetic polyneuropathy, without long-term current use of insulin (HCC)   [place] [facility] [place], Gavin Pound B, MD   8 months ago Type 2 diabetes mellitus with diabetic polyneuropathy, without long-term current use  of insulin (HCC)   [place] [facility] Marcine Matar, MD   10 months ago Need for influenza vaccination   [facility] Drucilla Chalet, RPH-CPP   1 year ago Type 2 diabetes mellitus with diabetic polyneuropathy, without long-term current use of insulin [facility])   [place] [facility] Marcine Matar, MD   1 year ago Coarse tremors   [facility] Marcine Matar, MD      Future Appointments            In 5 days Kerrin Champagne, MD [facility]

## 2020-09-30 DIAGNOSIS — J3081 Allergic rhinitis due to animal (cat) (dog) hair and dander: Secondary | ICD-10-CM | POA: Diagnosis not present

## 2020-09-30 DIAGNOSIS — J301 Allergic rhinitis due to pollen: Secondary | ICD-10-CM | POA: Diagnosis not present

## 2020-09-30 DIAGNOSIS — J3089 Other allergic rhinitis: Secondary | ICD-10-CM | POA: Diagnosis not present

## 2020-10-02 DIAGNOSIS — K589 Irritable bowel syndrome without diarrhea: Secondary | ICD-10-CM | POA: Diagnosis not present

## 2020-10-03 ENCOUNTER — Ambulatory Visit (INDEPENDENT_AMBULATORY_CARE_PROVIDER_SITE_OTHER): Payer: Medicaid Other | Admitting: Specialist

## 2020-10-03 ENCOUNTER — Other Ambulatory Visit: Payer: Self-pay

## 2020-10-03 ENCOUNTER — Encounter: Payer: Self-pay | Admitting: Specialist

## 2020-10-03 VITALS — BP 113/70 | HR 79 | Ht 66.0 in | Wt 167.0 lb

## 2020-10-03 DIAGNOSIS — M5030 Other cervical disc degeneration, unspecified cervical region: Secondary | ICD-10-CM | POA: Diagnosis not present

## 2020-10-03 DIAGNOSIS — M4316 Spondylolisthesis, lumbar region: Secondary | ICD-10-CM | POA: Diagnosis not present

## 2020-10-03 DIAGNOSIS — M1712 Unilateral primary osteoarthritis, left knee: Secondary | ICD-10-CM | POA: Diagnosis not present

## 2020-10-03 DIAGNOSIS — M1612 Unilateral primary osteoarthritis, left hip: Secondary | ICD-10-CM | POA: Diagnosis not present

## 2020-10-03 DIAGNOSIS — M48062 Spinal stenosis, lumbar region with neurogenic claudication: Secondary | ICD-10-CM | POA: Diagnosis not present

## 2020-10-03 DIAGNOSIS — Z9889 Other specified postprocedural states: Secondary | ICD-10-CM

## 2020-10-03 DIAGNOSIS — T8484XD Pain due to internal orthopedic prosthetic devices, implants and grafts, subsequent encounter: Secondary | ICD-10-CM | POA: Diagnosis not present

## 2020-10-03 DIAGNOSIS — Z96651 Presence of right artificial knee joint: Secondary | ICD-10-CM

## 2020-10-03 DIAGNOSIS — M4726 Other spondylosis with radiculopathy, lumbar region: Secondary | ICD-10-CM | POA: Diagnosis not present

## 2020-10-03 DIAGNOSIS — G8929 Other chronic pain: Secondary | ICD-10-CM | POA: Diagnosis not present

## 2020-10-03 DIAGNOSIS — M533 Sacrococcygeal disorders, not elsewhere classified: Secondary | ICD-10-CM

## 2020-10-03 NOTE — Progress Notes (Signed)
Office Visit Note   Patient: Stacy [name]           Date of Birth: [birth year]           MRN: [id number] Visit Date: 10/03/2020              Requested by: Marcine Matar, MD [address] [place],  [place] [address] PCP: Marcine Matar, MD   Assessment & Plan: Visit Diagnoses:  1. Other spondylosis with radiculopathy, lumbar region   2. Degenerative disc disease, cervical   3. S/P left knee arthroscopy   4. Chronic SI joint pain   5. Spinal stenosis, lumbar region, with neurogenic claudication   6. Spondylolisthesis, lumbar region   7. Unilateral primary osteoarthritis, left hip   8. Pain due to total right knee replacement, subsequent encounter   9. Osteoarthritis of left patellofemoral joint     Plan: Plan: Knee is suffering from osteoarthritis, only real proven treatments are Weight loss, NSIADs like meloxicam, diclofenac and exercise. Cymbalta also is an effective Well padded shoes help. Ice the knee that is suffering from osteoarthritis, only real proven treatments are   Ice the knee 2-3 times a day 15-20 mins at a time.-3 times a day 15-20 mins at a time. Hot showers in the AM.  Injection with steroid may be of benefit. Hemp CBD capsules, amazon.com 5,000-7,000 mg per bottle, 60 capsules per bottle, take one capsule twice a day. Cane in the left hand to use with left leg weight bearing. Follow-Up Instructions: No follow-ups on file.   Avoid frequent bending and stooping  No lifting greater than 10 lbs. May use ice or moist heat for pain. Weight loss is of benefit. Best medication for lumbar disc disease is arthritis medications like meloxicam and cymbalta. Exercise is important to improve your indurance and does allow people to function better inspite of back pain. Referral to PT for lumbar ddd and scoliosis and left knee patello femoral and mild medial knee OA.     Follow-Up Instructions: Return in about 6 weeks (around 11/14/2020).   Orders:   No orders of the defined types were placed in this encounter.  No orders of the defined types were placed in this encounter.     Procedures: No procedures performed   Clinical Data: No additional findings.   Subjective: Chief Complaint  Patient presents with   Right Hand - Follow-up    EMG/NCS Review   Left Hand - Follow-up    EMG/NCS Review   Lower Back - Follow-up    EMG/NCS Review    60 year old female with history of bilateral hand numbness and paresthesias, DM type 2 on metformin, she has had bilateral hand EMG/NCV by Dr. Evelina Bucy Neurology with mild finding right median n at wrist rest normal. Back pain and stiffness, pain with bending, stooping and lifting with difficulty with standing and walking distances. She has chronic left and right knee pain with  Patellofemoral changes underwent right TKR withpost op arthrofibrosis and  Underwent manipulations post knee replacement, now with good ROM the knee is still painful though and she reports it locks up some. The left knee has AM stiffness and with starting to stand and walk. There is a collapsing Degenerative scoliosis of the lumbar spine. Recent EMG and NCV of the left leg by Dr. Alvester Morin show normal EMG. Her knee pain is not as bad as her leg pain. Last therapy for her back was one year ago.  Review of Systems  Constitutional: Negative.   HENT: Negative.    Eyes: Negative.   Respiratory:  Positive for apnea, cough and shortness of breath.   Cardiovascular: Negative.  Negative for chest pain, palpitations and leg swelling.  Gastrointestinal:  Positive for abdominal pain and diarrhea.  Endocrine: Negative.   Genitourinary: Negative.   Musculoskeletal:  Positive for back pain. Negative for arthralgias, gait problem, joint swelling, myalgias, neck pain and neck stiffness.  Skin: Negative.  Negative for color change, pallor, rash and wound.  Allergic/Immunologic: Negative.   Neurological: Negative.    Psychiatric/Behavioral: Negative.      Objective: Vital Signs: BP 113/70 (BP Location: Left Arm, Patient Position: Sitting)   Pulse 79   Ht 5\' 6"  (1.676 m)   Wt 167 lb (75.8 kg)   LMP 09/01/2010   BMI 26.95 kg/m   Physical Exam Constitutional:      Appearance: She is well-developed.  HENT:     Head: Normocephalic and atraumatic.  Eyes:     Pupils: Pupils are equal, round, and reactive to light.  Pulmonary:     Effort: Pulmonary effort is normal.     Breath sounds: Normal breath sounds.  Abdominal:     General: Bowel sounds are normal.     Palpations: Abdomen is soft.  Musculoskeletal:     Cervical back: Normal range of motion and neck supple.     Lumbar back: Negative left straight leg raise test.  Skin:    General: Skin is warm and dry.  Neurological:     Mental Status: She is alert and oriented to person, place, and time.  Psychiatric:        Behavior: Behavior normal.        Thought Content: Thought content normal.        Judgment: Judgment normal.    Back Exam   Tenderness  The patient is experiencing tenderness in the lumbar.  Range of Motion  Extension:  abnormal  Flexion:  abnormal  Lateral bend right:  abnormal  Rotation right:  abnormal  Rotation left:  abnormal   Muscle Strength  Right Quadriceps:  5/5  Left Quadriceps:  5/5  Right Hamstrings:  5/5  Left Hamstrings:  5/5   Tests  Straight leg raise left: negative  Reflexes  Patellar:  2/4 Achilles:  2/4  Other  Gait: antalgic      Specialty Comments:  No specialty comments available.  Imaging: No results found.   PMFS History: Patient Active Problem List   Diagnosis Date Noted   Herniation of lumbar intervertebral disc with radiculopathy 10/02/2016    Priority: High    Class: Chronic   Chondromalacia of both patellae 06/03/2015    Priority: High    Class: Chronic   Colon polyps 06/06/2020   Lupus (HCC) 06/06/2020   Allergic rhinitis due to animal (cat) (dog) hair and  dander 04/08/2020   Allergic rhinitis due to pollen 04/08/2020   Food allergy 04/08/2020   Acute medial meniscus tear, left, subsequent encounter 03/21/2020   Chronic pain of left knee 02/15/2020   Paresthesia of skin 11/16/2019   History of total knee replacement, right 11/16/2019   Tobacco abuse 11/16/2019   Centrilobular emphysema (HCC) 08/10/2019   Incidental lung nodule, > 3mm and < 8mm 08/10/2019   OSA on CPAP 08/10/2019   Dyspnea on exertion 08/02/2019   Hyperlipidemia 08/02/2019   Lumbar radiculopathy 04/24/2019   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   Post laminectomy syndrome  02/23/2019   Abnormality of gait 02/23/2019   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   Unilateral primary osteoarthritis, left hip 12/28/2018   Lesion of skin of left ear 12/26/2018   Primary osteoarthritis of left hip 12/02/2018   Iron deficiency anemia 10/16/2018   Chronic pain syndrome 09/08/2018   Chronic pain of right knee 08/11/2018   Status post lumbar laminectomy 07/15/2018   Peripheral arterial disease (HCC) 04/05/2018   Moderate persistent asthma without complication 06/29/2017   Environmental and seasonal allergies 06/29/2017   Controlled type 2 diabetes mellitus with diabetic polyneuropathy, without long-term current use of insulin (HCC) 06/29/2017   Perennial allergic rhinitis 04/08/2017   Sensorineural hearing loss (SNHL), bilateral 04/08/2017   Chronic pansinusitis 03/25/2017   Eustachian tube dysfunction, bilateral 03/25/2017   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   Alopecia areata 08/19/2016   Spinal stenosis, lumbar region, with neurogenic claudication 06/03/2015   Tobacco use disorder 04/25/2015   DJD (degenerative joint disease) of knee 01/04/2015   Hemorrhoid 11/14/2014   Gout of big toe 07/19/2014   Essential hypertension 08/14/2013   Gastroesophageal reflux disease without esophagitis 08/14/2013   COPD (chronic obstructive pulmonary disease) (HCC) 04/17/2011   Past Medical  History:  Diagnosis Date   Allergy    Shellfish, cleaning products   Anxiety    Arthritis    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   Asthma    COPD (chronic obstructive pulmonary disease) (HCC)    Depression    Diabetes mellitus    Type II   GERD (gastroesophageal reflux disease)    Pt on Protonix daily   Glaucoma    Gout    Headache(784.0)    otc meds prn   Hyperlipidemia    Hypertension    Irritable bowel syndrome 11/19/2010   Neuropathy    Pneumonia YRS AGO   Restless legs    Shortness of breath    07/14/2018- uses  4 times a day    Family History  Problem Relation Age of Onset   Hypertension Father    Cancer Father    Heart disease Mother    Asthma Son        had as a child   Heart disease Sister    Breast cancer Sister    Hypertension Brother     Past Surgical History:  Procedure Laterality Date   BACK SURGERY     CHOLECYSTECTOMY     COLONOSCOPY     ENDOMETRIAL ABLATION  10/2010   EYE SURGERY     HERNIA REPAIR     umbicial hernia   JOINT REPLACEMENT Left 03/14/2019   Dr. Magnus Ivan hip   KNEE ARTHROSCOPY Left    06/07/2017 Dr. August [facility]   KNEE ARTHROSCOPY Left 03/21/2020   Procedure: LEFT KNEE ARTHROSCOPY WITH PARTIAL MEDIAL MENISCECTOMY;  Surgeon: [name], MD;  Location: [facility];  Service: Orthopedics;  Laterality: Left;   KNEE CLOSED REDUCTION Right 12/06/2015   Procedure: CLOSED MANIPULATION RIGHT KNEE;  Surgeon: Kerrin Champagne, MD;  Location: [facility];  Service: Orthopedics;  Laterality: Right;   KNEE CLOSED REDUCTION Right 01/17/2016   Procedure: CLOSED MANIPULATION RIGHT KNEE;  Surgeon: Kerrin Champagne, MD;  Location: [facility];  Service: Orthopedics;  Laterality: Right;   KNEE JOINT MANIPULATION Right 12/06/2015   LACRIMAL TUBE INSERTION Bilateral 03/01/2019   Procedure: LACRIMAL TUBE INSERTION;  Surgeon: Aura Camps, MD;  Location: [facility];  Service: Ophthalmology;   Laterality: Bilateral;  LACRIMAL TUBE REMOVAL Bilateral 05/03/2019   Procedure: BILATERAL NASOLACRIMAL DUCT PROBING, IIRIGATION AND TUBE REMOVAL BOTH EYES;  Surgeon: Aura Camps, MD;  Location: [facility];  Service: Ophthalmology;  Laterality: Bilateral;   LUMBAR DISC SURGERY  06/03/2015   L 2  L3 L4 L5    LUMBAR LAMINECTOMY/DECOMPRESSION MICRODISCECTOMY N/A 06/03/2015   Procedure: Bilateral lateral recess decompression L2-3, L3-4, L4-5;  Surgeon: Kerrin Champagne, MD;  Location: [facility];  Service: Orthopedics;  Laterality: N/A;   LUMBAR LAMINECTOMY/DECOMPRESSION MICRODISCECTOMY N/A 10/02/2016   Procedure: Right L5-S1 Lateral Recess Decompression  microdiscectomy;  Surgeon: Kerrin Champagne, MD;  Location: [facility];  Service: Orthopedics;  Laterality: N/A;   LUMBAR LAMINECTOMY/DECOMPRESSION MICRODISCECTOMY N/A 07/15/2018   Procedure: LEFT L3-4 MICRODISCECTOMY;  Surgeon: Kerrin Champagne, MD;  Location: [facility] OR;  Service: Orthopedics;  Laterality: N/A;   svd      x 2   TEAR DUCT PROBING Bilateral 03/01/2019   Procedure: TEAR DUCT PROBING WITH IRRIGATION;  Surgeon: Aura Camps, MD;  [facility];  Service: Ophthalmology;  Laterality: Bilateral;   TOTAL HIP ARTHROPLASTY Left 03/14/2019   Procedure: LEFT TOTAL HIP ARTHROPLASTY ANTERIOR APPROACH;  Surgeon: [name], MD;  Location: [facility];  Service: Orthopedics;  Laterality: Left;   TOTAL KNEE ARTHROPLASTY Right 09/06/2015   Procedure: RIGHT TOTAL KNEE ARTHROPLASTY;  Surgeon: Kerrin Champagne, MD;  Location: [facility];  Service: Orthopedics;  Laterality: Right;   TUBAL LIGATION     UPPER GASTROINTESTINAL ENDOSCOPY  04/28/11   Social History   Occupational History   Occupation: unemployed    Associate Professor: UNEMPLOYED  Tobacco Use   Smoking status: Some Days    Packs/day: 0.25    Years: 32.00    Pack years: 8.00    Types: Cigarettes   Smokeless tobacco: Never   Tobacco comments:    DOWN TO 3 PER DAY  Vaping Use    Vaping Use: Never used  Substance and Sexual Activity   Alcohol use: No   Drug use: No   Sexual activity: Yes    Birth control/protection: Surgical, Post-menopausal    Comment: tubal ligation

## 2020-10-03 NOTE — Patient Instructions (Addendum)
Plan: Knee is suffering from osteoarthritis, only real proven treatments are Weight loss, NSIADs like meloxicam, diclofenac and exercise. Cymbalta also is an effective Well padded shoes help. Ice the knee that is suffering from osteoarthritis, only real proven treatments are   Ice the knee 2-3 times a day 15-20 mins at a time.-3 times a day 15-20 mins at a time. Hot showers in the AM.  Injection with steroid may be of benefit. Hemp CBD capsules, amazon.com 5,000-7,000 mg per bottle, 60 capsules per bottle, take one capsule twice a day. Cane in the left hand to use with left leg weight bearing. Follow-Up Instructions: No follow-ups on file.   Avoid frequent bending and stooping  No lifting greater than 10 lbs. May use ice or moist heat for pain. Weight loss is of benefit. Best medication for lumbar disc disease is arthritis medications like meloxicam and cymbalta. Exercise is important to improve your indurance and does allow people to function better inspite of back pain. Referral to PT for lumbar ddd and scoliosis and left knee patello femoral and mild medial knee OA.

## 2020-10-08 DIAGNOSIS — J301 Allergic rhinitis due to pollen: Secondary | ICD-10-CM | POA: Diagnosis not present

## 2020-10-08 DIAGNOSIS — J3081 Allergic rhinitis due to animal (cat) (dog) hair and dander: Secondary | ICD-10-CM | POA: Diagnosis not present

## 2020-10-08 DIAGNOSIS — J3089 Other allergic rhinitis: Secondary | ICD-10-CM | POA: Diagnosis not present

## 2020-10-09 DIAGNOSIS — F331 Major depressive disorder, recurrent, moderate: Secondary | ICD-10-CM | POA: Diagnosis not present

## 2020-10-10 ENCOUNTER — Telehealth: Payer: Self-pay | Admitting: Neurology

## 2020-10-10 NOTE — Telephone Encounter (Signed)
Please let pt know that her carpal tunnel syndrome was only on the right side and mild. Mild carpal tunnel syndrome would not cause so much pain, so I'm not sure if this is truly causing her discomfort. We can add her to the wait list to be re-evaluated.  She is followed by [facility] and would need to reach out to their office for pain relief options.

## 2020-10-10 NOTE — Telephone Encounter (Signed)
Pt called and wanted another appt sooner than nov, but patel did not have anything. She said her carpel tunnel is worse even with the braces on. She said she needs something to help take the pain away.

## 2020-10-11 NOTE — Telephone Encounter (Signed)
Called patient and informed her that her carpal tunnel syndrome was only on the right side and mild. Mild carpal tunnel syndrome would not cause so much pain, so Dr. Allena Katz is not sure if this is truly causing her discomfort. Informed patient that we can add her to the wait list to be re-evaluated. Also informed patient that She is followed by [facility] and would need to reach out to their office for pain relief options.   Patient verbalized understanding and had no further questions or concerns.

## 2020-10-14 DIAGNOSIS — J3089 Other allergic rhinitis: Secondary | ICD-10-CM | POA: Diagnosis not present

## 2020-10-14 DIAGNOSIS — J301 Allergic rhinitis due to pollen: Secondary | ICD-10-CM | POA: Diagnosis not present

## 2020-10-14 DIAGNOSIS — J3081 Allergic rhinitis due to animal (cat) (dog) hair and dander: Secondary | ICD-10-CM | POA: Diagnosis not present

## 2020-10-16 DIAGNOSIS — F331 Major depressive disorder, recurrent, moderate: Secondary | ICD-10-CM | POA: Diagnosis not present

## 2020-10-21 ENCOUNTER — Ambulatory Visit: Payer: Medicaid Other | Attending: Specialist | Admitting: Physical Therapy

## 2020-10-21 ENCOUNTER — Other Ambulatory Visit: Payer: Self-pay

## 2020-10-21 ENCOUNTER — Encounter: Payer: Self-pay | Admitting: Physical Therapy

## 2020-10-21 DIAGNOSIS — M25562 Pain in left knee: Secondary | ICD-10-CM | POA: Diagnosis not present

## 2020-10-21 DIAGNOSIS — R262 Difficulty in walking, not elsewhere classified: Secondary | ICD-10-CM | POA: Insufficient documentation

## 2020-10-21 DIAGNOSIS — M545 Low back pain, unspecified: Secondary | ICD-10-CM | POA: Diagnosis not present

## 2020-10-21 DIAGNOSIS — M6281 Muscle weakness (generalized): Secondary | ICD-10-CM | POA: Diagnosis not present

## 2020-10-21 DIAGNOSIS — G8929 Other chronic pain: Secondary | ICD-10-CM | POA: Insufficient documentation

## 2020-10-21 DIAGNOSIS — R296 Repeated falls: Secondary | ICD-10-CM | POA: Diagnosis not present

## 2020-10-21 NOTE — Therapy (Addendum)
[name] [name] [place], [place], [address] Phone: [phone]   Fax:  [phone]  Physical Therapy Evaluation  Patient Details  Name: Stacy [name] MRN: [id number] Date of Birth: 1960/09/19 Referring Provider (Stacy [name]): Kerrin Champagne, MD   Encounter Date: 10/21/2020   Stacy [name] End of Session - 10/21/20 1157     Visit Number 1    Number of Visits 9    Date for Stacy [name] Re-Evaluation 12/16/20    Authorization Type Traditional Medicaid    Stacy [name] Start Time 0802    Stacy [name] Stop Time 0843    Stacy [name] Time Calculation (min) 41 min    Activity Tolerance Patient tolerated treatment well;Patient limited by pain    Behavior During Therapy [facility] for tasks assessed/performed             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   Asthma    COPD (chronic obstructive pulmonary disease) (HCC)    Depression    Diabetes mellitus    Type II   GERD (gastroesophageal reflux disease)    Stacy [name] on Protonix daily   Glaucoma    Gout    Headache(784.0)    otc meds prn   Hyperlipidemia    Hypertension    Irritable bowel syndrome 11/19/2010   Neuropathy    Pneumonia YRS AGO   Restless legs    Shortness of breath    07/14/2018- uses  4 times a day    Past Surgical History:  Procedure Laterality Date   BACK SURGERY     CHOLECYSTECTOMY     COLONOSCOPY     ENDOMETRIAL ABLATION  10/2010   EYE SURGERY     HERNIA REPAIR     umbicial hernia   JOINT REPLACEMENT Left 03/14/2019   Dr. Magnus Ivan hip   KNEE ARTHROSCOPY Left    06/07/2017 Dr. August [facility]   KNEE ARTHROSCOPY Left 03/21/2020   Procedure: LEFT KNEE ARTHROSCOPY WITH PARTIAL MEDIAL MENISCECTOMY;  Surgeon: [name], MD;  Location: [facility];  Service: Orthopedics;  Laterality: Left;   KNEE CLOSED REDUCTION Right 12/06/2015    Procedure: CLOSED MANIPULATION RIGHT KNEE;  Surgeon: Kerrin Champagne, MD;  Location: [facility];  Service: Orthopedics;  Laterality: Right;   KNEE CLOSED REDUCTION Right 01/17/2016   Procedure: CLOSED MANIPULATION RIGHT KNEE;  Surgeon: Kerrin Champagne, MD;  Location: [facility];  Service: Orthopedics;  Laterality: Right;   KNEE JOINT MANIPULATION Right 12/06/2015   LACRIMAL TUBE INSERTION Bilateral 03/01/2019   Procedure: LACRIMAL TUBE INSERTION;  Surgeon: Aura Camps, MD;  Location: [facility];  Service: Ophthalmology;  Laterality: Bilateral;   LACRIMAL TUBE REMOVAL Bilateral 05/03/2019   Procedure: BILATERAL NASOLACRIMAL DUCT PROBING, IIRIGATION AND TUBE REMOVAL BOTH EYES;  Surgeon: Aura Camps, MD;  Location: [facility];  Service: Ophthalmology;  Laterality: Bilateral;   LUMBAR DISC SURGERY  06/03/2015   L 2  L3 L4 L5    LUMBAR LAMINECTOMY/DECOMPRESSION MICRODISCECTOMY N/A 06/03/2015   Procedure: Bilateral lateral recess decompression L2-3, L3-4, L4-5;  Surgeon: Kerrin Champagne, MD;  Location: [facility];  Service: Orthopedics;  Laterality: N/A;   LUMBAR LAMINECTOMY/DECOMPRESSION MICRODISCECTOMY N/A 10/02/2016   Procedure: Right L5-S1 Lateral Recess Decompression  microdiscectomy;  Surgeon: Kerrin Champagne, MD;  Location: [facility];  Service: Orthopedics;  Laterality: N/A;   LUMBAR LAMINECTOMY/DECOMPRESSION MICRODISCECTOMY N/A 07/15/2018   Procedure: LEFT L3-4 MICRODISCECTOMY;  Surgeon: Kerrin Champagne, MD;  Location: [facility];  Service: Orthopedics;  Laterality: N/A;   svd      x 2   TEAR DUCT PROBING Bilateral 03/01/2019   Procedure: TEAR DUCT PROBING WITH IRRIGATION;  Surgeon: Aura Camps, MD;  Location: [facility];  Service: Ophthalmology;  Laterality: Bilateral;   TOTAL HIP ARTHROPLASTY Left 03/14/2019   Procedure: LEFT TOTAL HIP ARTHROPLASTY ANTERIOR APPROACH;  Surgeon: [name], MD;  [facility];  Service: Orthopedics;  Laterality: Left;    TOTAL KNEE ARTHROPLASTY Right 09/06/2015   Procedure: RIGHT TOTAL KNEE ARTHROPLASTY;  Surgeon: Kerrin Champagne, MD;  Location: [facility];  Service: Orthopedics;  Laterality: Right;   TUBAL LIGATION     UPPER GASTROINTESTINAL ENDOSCOPY  04/28/11    There were no vitals filed for this visit.    Subjective Assessment - 10/21/20 0800     Subjective Patient reports LBP for several years- reports back surgery in 2017. Surgery gave her some relief, until an exacerbation of pain this year, when she found out that she had scoliosis. Pain is located in the B LB with radiation and N/T down LEs to knees. Also reports hx of neuropathy. Worse with walking, standing, bending, lifting. Better with ice. Also reports L knee pain; recalls having had a scope in 2021. Reports difficulty climbing stairs- has to go down sideways and frequent knee buckling. Pain is located over the medial aspect and experiences spasms in the inner thigh. Better with ice. Uses 4WW and cane.    Pertinent History R TKA with manipulation under anesthesia 2017, asthma, COPD, DM, gout, HA, HTN, neuropathy, lumbar lami/decompression L2-5 2017, L L5-S1 lateral decompression, L THA 2021, L knee scope with partial medial menisectomy 03/21/20    Limitations Lifting;Standing;Walking;House hold activities    How long can you sit comfortably? not limited    How long can you stand comfortably? 5-10 min    How long can you walk comfortably? 5-10 min    Diagnostic tests 07/29/20 L knee xray: medial left knee join narrowing and also there is medial P-F facet joint narrowing; 07/29/20 lumbar xray: DDD L2-3, L3-4 and L4-5 mild L5-S1 with endplates sclerosis and cystic changes at L2-3 and L3-4 . No acute findings.    Patient Stated Goals decrease pain    Currently in Pain? Yes    Pain Score 6     Pain Location Back    Pain Orientation Right;Left;Lower    Pain Descriptors / Indicators Sharp;Throbbing    Pain Type Chronic pain    Multiple Pain Sites Yes    Pain  Location Knee    Pain Orientation Left    Pain Descriptors / Indicators Sharp    Pain Type Chronic pain                [facility] Stacy [name] Assessment - 10/21/20 0812       Assessment   Medical Diagnosis Other spondylosis with radiculopathy, lumbar region; S/P left knee arthroscopy; Spinal stenosis, lumbar region, with neurogenic claudication; Spondylolisthesis, lumbar region; Unilateral primary osteoarthritis, left hip; Osteoarthritis of left patellofemoral joint    Referring Provider (Stacy [name]) Kerrin Champagne, MD    Onset Date/Surgical Date --   chronic- several years   Next MD Visit 11/17/20    Prior Therapy yes- for LB and knee      Precautions   Precautions --  Per MD- "flexion exercises lumbar spine, extension of lumbar Spine may cause increase discomfort due to foramenal narrowing"     Balance Screen   Has the patient fallen in the past 6 months Yes    How many times? 10+   L knee buckling   Has the patient had a decrease in activity level because of a fear of falling?  Yes    Is the patient reluctant to leave their home because of a fear of falling?  Yes      Home Environment   Living Environment Private residence    Living Arrangements Spouse/significant other    Available Help at Discharge Family    Type of Home House    Home Access Stairs to enter    Entrance Stairs-Number of Steps 2    Entrance Stairs-Rails None    Home Layout Two level    Alternate Level Stairs-Number of Steps 13    Alternate Level Stairs-Rails Left    Home Equipment Walker - 4 wheels;Shower seat    Additional Comments husband does cooking d/t limited standing tolerance      Prior Function   Level of Independence Independent;Independent with basic ADLs    Vocation On disability    Leisure walking      Cognition   Overall Cognitive Status Within Functional Limits for tasks assessed      Sensation   Light Touch Appears Intact      Coordination   Gross Motor Movements are Fluid and Coordinated Yes       Posture/Postural Control   Posture/Postural Control Postural limitations    Postural Limitations Rounded Shoulders;Forward head    Posture Comments B elevated shoulders      ROM / Strength   AROM / PROM / Strength AROM;PROM;Strength      AROM   AROM Assessment Site Lumbar;Knee    Right/Left Knee Left;Right    Right Knee Extension 0    Right Knee Flexion 112    Left Knee Extension -4    Left Knee Flexion 133    Lumbar Flexion ankles   pain   Lumbar Extension moderately limited   pai   Lumbar - Right Side Bend distal thigh with compensation    Lumbar - Left Side Bend jt line    Lumbar - Right Rotation mildly limited   pain   Lumbar - Left Rotation WFL   pain     Strength   Strength Assessment Site Hip;Knee;Ankle    Right/Left Hip Right;Left    Right Hip Flexion 4/5    Right Hip ABduction 4-/5    Right Hip ADduction 4/5    Left Hip Flexion 4/5    Left Hip ABduction 4-/5    Left Hip ADduction 4/5    Right/Left Knee Right;Left    Right Knee Flexion 4+/5    Right Knee Extension 4+/5    Left Knee Flexion 4/5   "popping"   Left Knee Extension 4-/5   "popping"   Right/Left Ankle Right;Left    Right Ankle Dorsiflexion 4/5    Right Ankle Plantar Flexion 4/5    Left Ankle Dorsiflexion 4+/5    Left Ankle Plantar Flexion 4/5      Flexibility   Soft Tissue Assessment /Muscle Length yes    Hamstrings B WFL    Quadriceps B mildly tight in mod thomas    Piriformis B limited by LBP in fig 4 and KTOS      Palpation   Palpation comment diffusely  TTP over L medial and lateral aspects of knee; very TTP and tight in B QL and lumbar paraspinals; no TTP in buttocks      Ambulation/Gait   Assistive device Straight cane    Gait Pattern Step-to pattern;Step-through pattern;Decreased step length - right;Decreased step length - left;Decreased hip/knee flexion - right;Lateral hip instability    Ambulation Surface Level;Indoor    Gait velocity decreased                         Objective measurements completed on examination: See above findings.                Stacy [name] Education - 10/21/20 1157     Education Details prognosis, POC, HEP    Person(s) Educated Patient    Methods Explanation;Demonstration;Tactile cues;Verbal cues;Handout    Comprehension Verbalized understanding;Returned demonstration              Stacy [name] Short Term Goals - 10/21/20 1216       Stacy [name] SHORT TERM GOAL #1   Title I with initial HEP    Time 3    Period Weeks    Status New    Target Date 11/11/20               Stacy [name] Long Term Goals - 10/21/20 1216       Stacy [name] LONG TERM GOAL #1   Title I with advanced HEP    Time 8    Period Weeks    Status New    Target Date 12/16/20      Stacy [name] LONG TERM GOAL #2   Title Patient to demonstrate lumbar AROM WFL and without pain limiting.    Time 8    Period Weeks    Status New    Target Date 12/16/20      Stacy [name] LONG TERM GOAL #3   Title Patient to demonstrate B LE strength >/=4+/5.    Time 8    Period Weeks    Status New    Target Date 12/16/20      Stacy [name] LONG TERM GOAL #4   Title Patient to demonstrate alternating reciprocal pattern when ascending and descending stairs with good stability and 1 handrail as needed.    Time 8    Period Weeks    Status New    Target Date 12/16/20      Stacy [name] LONG TERM GOAL #5   Title Patient to report tolerance for 30 minutes of standing/walking without pain limiting.    Time 8    Period Weeks    Status New    Target Date 12/16/20      Additional Long Term Goals   Additional Long Term Goals Yes      Stacy [name] LONG TERM GOAL #6   Title Patient to report 50% improvement in frequency of L knee buckling.    Time 8    Period Weeks    Status New    Target Date 12/16/20                    Plan - 10/21/20 1159     Clinical Impression Statement Patient is a 60 y/o F presenting to OPPT with c/o chronic insidious LBP and L knee pain for several years. PMH significant for  L2-5 lami/decompression in 2017, L L5-S1 lateral decompression, and L knee scope with partial medial meniscectomy on 03/21/20. Pain is located over B LB with radiation and N/T down LEs to  knees. Worse with walking, standing, bending, lifting. L knee pain occurs diffusely and worse with stair navigation. Also reports episodes of L knee buckling causing 10+ falls in the past 6 months. Patient today presenting with abnormal posture, limited R knee flexion AROM, limited and painful lumbar AROM, decreased LE strength, tightness and painful flexibility testing, diffusely TTP over L medial and lateral aspects of knee, very TTP and tight in B QL and lumbar paraspinals, and gait deviations. Patient was educated on gentle stretching and strengthening HEP and reported understanding. Would benefit from skilled Stacy [name] services 1x/week for 8 weeks to address aforementioned impairments.    Personal Factors and Comorbidities Age;Comorbidity 3+;Time since onset of injury/illness/exacerbation;Past/Current Experience;Fitness    Comorbidities R TKA with manipulation under anesthesia 2017, asthma, COPD, DM, gout, HA, HTN, neuropathy, lumbar lami/decompression L2-5 2017, L L5-S1 lateral decompression, L THA 2021, L knee scope with partial medial menisectomy 03/21/20    Examination-Activity Limitations Locomotion Level;Transfers;Bend;Carry;Squat;Stairs;Hygiene/Grooming;Stand;Lift    Examination-Participation Restrictions Laundry;Shop;Community Activity;Cleaning;Meal Prep;Church    Stability/Clinical Decision Making Evolving/Moderate complexity    Clinical Decision Making Moderate    Rehab Potential Good    Stacy [name] Frequency 1x / week    Stacy [name] Duration 8 weeks    Stacy [name] Treatment/Interventions ADLs/Self Care Home Management;Cryotherapy;Electrical Stimulation;Moist Heat;Traction;Ultrasound;Functional mobility training;Patient/family education;Therapeutic exercise;Therapeutic activities;Manual techniques;Dry needling;Neuromuscular  re-education;Passive range of motion;Iontophoresis 4mg /ml Dexamethasone;Gait training;Stair training;Balance training;Energy conservation;Vasopneumatic Device;Taping    Stacy [name] Next Visit Plan reassess HEP, LE and core strengthening, lumbopelvic mobility    Consulted and Agree with Plan of Care Patient             Patient will benefit from skilled therapeutic intervention in order to improve the following deficits and impairments:  Abnormal gait, Decreased range of motion, Difficulty walking, Increased muscle spasms, Decreased activity tolerance, Pain, Improper body mechanics, Impaired flexibility, Decreased strength, Postural dysfunction, Hypomobility, Decreased balance  Visit Diagnosis: Chronic pain of left knee - Plan: Stacy [name] plan of care cert/re-cert  Chronic bilateral low back pain, unspecified whether sciatica present - Plan: Stacy [name] plan of care cert/re-cert  Repeated falls - Plan: Stacy [name] plan of care cert/re-cert  Muscle weakness (generalized) - Plan: Stacy [name] plan of care cert/re-cert  Difficulty in walking, not elsewhere classified - Plan: Stacy [name] plan of care cert/re-cert     Problem List Patient Active Problem List   Diagnosis Date Noted   Colon polyps 06/06/2020   Lupus (HCC) 06/06/2020   Allergic rhinitis due to animal (cat) (dog) hair and dander 04/08/2020   Allergic rhinitis due to pollen 04/08/2020   Food allergy 04/08/2020   Acute medial meniscus tear, left, subsequent encounter 03/21/2020   Chronic pain of left knee 02/15/2020   Paresthesia of skin 11/16/2019   History of total knee replacement, right 11/16/2019   Tobacco abuse 11/16/2019   Centrilobular emphysema (HCC) 08/10/2019   Incidental lung nodule, > 82mm and < 72mm 08/10/2019   OSA on CPAP 08/10/2019   Dyspnea on exertion 08/02/2019   Hyperlipidemia 08/02/2019   Lumbar radiculopathy 04/24/2019   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   Post laminectomy syndrome 02/23/2019   Abnormality of gait 02/23/2019    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   Unilateral primary osteoarthritis, left hip 12/28/2018   Lesion of skin of left ear 12/26/2018   Primary osteoarthritis of left hip 12/02/2018   Iron deficiency anemia 10/16/2018   Chronic pain syndrome 09/08/2018   Chronic pain of right knee 08/11/2018   Status post lumbar laminectomy 07/15/2018   Peripheral arterial disease (HCC)  04/05/2018   Moderate persistent asthma without complication 06/29/2017   Environmental and seasonal allergies 06/29/2017   Controlled type 2 diabetes mellitus with diabetic polyneuropathy, without long-term current use of insulin (HCC) 06/29/2017   Perennial allergic rhinitis 04/08/2017   Sensorineural hearing loss (SNHL), bilateral 04/08/2017   Chronic pansinusitis 03/25/2017   Eustachian tube dysfunction, bilateral 03/25/2017   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   Herniation of lumbar intervertebral disc with radiculopathy 10/02/2016    Class: Chronic   Alopecia areata 08/19/2016   Chondromalacia of both patellae 06/03/2015    Class: Chronic   Spinal stenosis, lumbar region, with neurogenic claudication 06/03/2015   Tobacco use disorder 04/25/2015   DJD (degenerative joint disease) of knee 01/04/2015   Hemorrhoid 11/14/2014   Gout of big toe 07/19/2014   Essential hypertension 08/14/2013   Gastroesophageal reflux disease without esophagitis 08/14/2013   COPD (chronic obstructive pulmonary disease) (HCC) 04/17/2011    Stacy [name], Stacy [name], Stacy [name] 10/21/20 2:56 PM   [facility] [address] [place], [place], [address] Phone: [phone]   Fax:  [phone]  Name: Stacy [name] [facility] MRN: [id number] Date of Birth: [birth year]

## 2020-10-23 DIAGNOSIS — F331 Major depressive disorder, recurrent, moderate: Secondary | ICD-10-CM | POA: Diagnosis not present

## 2020-10-25 ENCOUNTER — Ambulatory Visit: Payer: Medicaid Other | Admitting: Podiatry

## 2020-10-25 ENCOUNTER — Encounter: Payer: Self-pay | Admitting: Podiatry

## 2020-10-25 ENCOUNTER — Other Ambulatory Visit: Payer: Self-pay

## 2020-10-25 DIAGNOSIS — M79676 Pain in unspecified toe(s): Secondary | ICD-10-CM

## 2020-10-25 DIAGNOSIS — B351 Tinea unguium: Secondary | ICD-10-CM

## 2020-10-25 DIAGNOSIS — E1151 Type 2 diabetes mellitus with diabetic peripheral angiopathy without gangrene: Secondary | ICD-10-CM

## 2020-10-28 ENCOUNTER — Ambulatory Visit: Payer: Medicaid Other | Admitting: Physical Therapy

## 2020-10-28 ENCOUNTER — Other Ambulatory Visit: Payer: Self-pay

## 2020-10-28 ENCOUNTER — Encounter: Payer: Self-pay | Admitting: Physical Therapy

## 2020-10-28 DIAGNOSIS — M6281 Muscle weakness (generalized): Secondary | ICD-10-CM

## 2020-10-28 DIAGNOSIS — R296 Repeated falls: Secondary | ICD-10-CM | POA: Diagnosis not present

## 2020-10-28 DIAGNOSIS — M25562 Pain in left knee: Secondary | ICD-10-CM | POA: Diagnosis not present

## 2020-10-28 DIAGNOSIS — R262 Difficulty in walking, not elsewhere classified: Secondary | ICD-10-CM | POA: Diagnosis not present

## 2020-10-28 DIAGNOSIS — G8929 Other chronic pain: Secondary | ICD-10-CM | POA: Diagnosis not present

## 2020-10-28 DIAGNOSIS — M545 Low back pain, unspecified: Secondary | ICD-10-CM

## 2020-10-28 NOTE — Therapy (Signed)
[facility] [address] [place], [place], [address] Phone: [phone]   Fax:  [phone]  Physical Therapy Treatment  Patient Details  Name: Stacy [name] [facility] MRN: [id number] Date of Birth: 1960-03-01 Referring Provider (PT): Kerrin Champagne, MD   Encounter Date: 10/28/2020   PT End of Session - 10/28/20 0842     Visit Number 2    Number of Visits 9    Date for PT Re-Evaluation 12/16/20    Authorization Type Traditional Medicaid    PT Start Time 0803    PT Stop Time 0852    PT Time Calculation (min) 49 min    Activity Tolerance Patient tolerated treatment well    Behavior During Therapy [facility] for tasks assessed/performed             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   Asthma    COPD (chronic obstructive pulmonary disease) (HCC)    Depression    Diabetes mellitus    Type II   GERD (gastroesophageal reflux disease)    Pt on Protonix daily   Glaucoma    Gout    Headache(784.0)    otc meds prn   Hyperlipidemia    Hypertension    Irritable bowel syndrome 11/19/2010   Neuropathy    Pneumonia YRS AGO   Restless legs    Shortness of breath    07/14/2018- uses  4 times a day    Past Surgical History:  Procedure Laterality Date   BACK SURGERY     CHOLECYSTECTOMY     COLONOSCOPY     ENDOMETRIAL ABLATION  10/2010   EYE SURGERY     HERNIA REPAIR     umbicial hernia   JOINT REPLACEMENT Left 03/14/2019   Dr. Magnus Ivan hip   KNEE ARTHROSCOPY Left    06/07/2017 Dr. August [facility]   KNEE ARTHROSCOPY Left 03/21/2020   Procedure: LEFT KNEE ARTHROSCOPY WITH PARTIAL MEDIAL MENISCECTOMY;  Surgeon: [name], MD;  Location: [facility];  Service: Orthopedics;  Laterality: Left;   KNEE CLOSED REDUCTION Right 12/06/2015   Procedure: CLOSED MANIPULATION  RIGHT KNEE;  Surgeon: Kerrin Champagne, MD;  Location: [facility];  Service: Orthopedics;  Laterality: Right;   KNEE CLOSED REDUCTION Right 01/17/2016   Procedure: CLOSED MANIPULATION RIGHT KNEE;  Surgeon: Kerrin Champagne, MD;  Location: [facility];  Service: Orthopedics;  Laterality: Right;   KNEE JOINT MANIPULATION Right 12/06/2015   LACRIMAL TUBE INSERTION Bilateral 03/01/2019   Procedure: LACRIMAL TUBE INSERTION;  Surgeon: Aura Camps, MD;  Location: [facility];  Service: Ophthalmology;  Laterality: Bilateral;   LACRIMAL TUBE REMOVAL Bilateral 05/03/2019   Procedure: BILATERAL NASOLACRIMAL DUCT PROBING, IIRIGATION AND TUBE REMOVAL BOTH EYES;  Surgeon: Aura Camps, MD;  Location: [facility];  Service: Ophthalmology;  Laterality: Bilateral;   LUMBAR DISC SURGERY  06/03/2015   L 2  L3 L4 L5    LUMBAR LAMINECTOMY/DECOMPRESSION MICRODISCECTOMY N/A 06/03/2015   Procedure: Bilateral lateral recess decompression L2-3, L3-4, L4-5;  Surgeon: Kerrin Champagne, MD;  Location[facility];  Service: Orthopedics;  Laterality: N/A;   LUMBAR LAMINECTOMY/DECOMPRESSION MICRODISCECTOMY N/A 10/02/2016   Procedure: Right L5-S1 Lateral Recess Decompression  microdiscectomy;  Surgeon: Kerrin Champagne, MD;  Location: [facility] OR;  Service: Orthopedics;  Laterality: N/A;   LUMBAR LAMINECTOMY/DECOMPRESSION MICRODISCECTOMY N/A 07/15/2018   Procedure: LEFT L3-4 MICRODISCECTOMY;  Surgeon: Kerrin Champagne, MD;  Location: [facility] OR;  Service: Orthopedics;  Laterality: N/A;   svd      x 2   TEAR DUCT PROBING Bilateral 03/01/2019   Procedure: TEAR DUCT PROBING WITH IRRIGATION;  Surgeon: Aura Camps, MD;  Location: [facility];  Service: Ophthalmology;  Laterality: Bilateral;   TOTAL HIP ARTHROPLASTY Left 03/14/2019   Procedure: LEFT TOTAL HIP ARTHROPLASTY ANTERIOR APPROACH;  Surgeon: [name], MD;  [facility];  Service: Orthopedics;  Laterality: Left;   TOTAL KNEE ARTHROPLASTY Right  09/06/2015   Procedure: RIGHT TOTAL KNEE ARTHROPLASTY;  Surgeon: Kerrin Champagne, MD;  [facility];  Service: Orthopedics;  Laterality: Right;   TUBAL LIGATION     UPPER GASTROINTESTINAL ENDOSCOPY  04/28/11    There were no vitals filed for this visit.   Subjective Assessment - 10/28/20 0805     Subjective Has not been able to do exercise because My leg ain' been bending"    Pertinent History R TKA with manipulation under anesthesia 2017, asthma, COPD, DM, gout, HA, HTN, neuropathy, lumbar lami/decompression L2-5 2017, L L5-S1 lateral decompression, L THA 2021, L knee scope with partial medial menisectomy 03/21/20    Currently in Pain? Yes    Pain Score 5     Pain Location Back    Pain Orientation Lower                               [facility] Adult PT Treatment/Exercise - 10/28/20 0001       Exercises   Exercises Lumbar;Knee/Hip      Lumbar Exercises: Stretches   Passive Hamstring Stretch Right;3 reps;10 seconds      Lumbar Exercises: Standing   Shoulder Extension Strengthening;Both;20 reps;Theraband      Lumbar Exercises: Seated   Other Seated Lumbar Exercises rows green tband 2x10      Lumbar Exercises: Supine   Bridge Compliant;10 reps;2 seconds   x2     Knee/Hip Exercises: Aerobic   Nustep L5 x 6 min      Knee/Hip Exercises: Seated   Long Arc Quad Strengthening;Both;2 sets;10 reps    Long Arc Quad Weight 2 lbs.    Marching Both;2 sets;10 reps;Strengthening    Marching Weights 2 lbs.    Hamstring Curl Both;2 sets;10 reps    Hamstring Limitations green Tband    Sit to Sand 2 sets;with UE support;10 reps      Modalities   Modalities Cryotherapy      Cryotherapy   Number Minutes Cryotherapy 10 Minutes    Cryotherapy Location Lumbar Spine    Type of Cryotherapy Ice pack                       PT Short Term Goals - 10/21/20 1216       PT SHORT TERM GOAL #1   Title I with initial HEP    Time 3    Period Weeks    Status New     Target Date 11/11/20               PT Long Term Goals - 10/21/20 1216       PT LONG TERM GOAL #1   Title I with advanced HEP    Time 8    Period Weeks  Status New    Target Date 12/16/20      PT LONG TERM GOAL #2   Title Patient to demonstrate lumbar AROM WFL and without pain limiting.    Time 8    Period Weeks    Status New    Target Date 12/16/20      PT LONG TERM GOAL #3   Title Patient to demonstrate B LE strength >/=4+/5.    Time 8    Period Weeks    Status New    Target Date 12/16/20      PT LONG TERM GOAL #4   Title Patient to demonstrate alternating reciprocal pattern when ascending and descending stairs with good stability and 1 handrail as needed.    Time 8    Period Weeks    Status New    Target Date 12/16/20      PT LONG TERM GOAL #5   Title Patient to report tolerance for 30 minutes of standing/walking without pain limiting.    Time 8    Period Weeks    Status New    Target Date 12/16/20      Additional Long Term Goals   Additional Long Term Goals Yes      PT LONG TERM GOAL #6   Title Patient to report 50% improvement in frequency of L knee buckling.    Time 8    Period Weeks    Status New    Target Date 12/16/20                   Plan - 10/28/20 0842     Clinical Impression Statement Pt enters clinic reporting that she has been feeling well with little Low back pain. No issues with aerobic warm up. She did well with all seated exercises. R knee has a audible pop at times throughout session. Some compensation noted with sit to stands. Cue needed for core engagement with ab sets. Good HS flexibility noted with passive stretching. Pt did has a fall during session. Therapist was in room setting up modality, pt attempted to ambulate to treatment room with rollator. Pt reports that her R le locked up and the L gave out. Mod assist needed to stand. No injuries sustained, pt stated that she felt fine.    Personal Factors and Comorbidities  Age;Comorbidity 3+;Time since onset of injury/illness/exacerbation;Past/Current Experience;Fitness    Comorbidities R TKA with manipulation under anesthesia 2017, asthma, COPD, DM, gout, HA, HTN, neuropathy, lumbar lami/decompression L2-5 2017, L L5-S1 lateral decompression, L THA 2021, L knee scope with partial medial menisectomy 03/21/20    Examination-Activity Limitations Locomotion Level;Transfers;Bend;Carry;Squat;Stairs;Hygiene/Grooming;Stand;Lift    Examination-Participation Restrictions Laundry;Shop;Community Activity;Cleaning;Meal Prep;Church             Patient will benefit from skilled therapeutic intervention in order to improve the following deficits and impairments:  Abnormal gait, Decreased range of motion, Difficulty walking, Increased muscle spasms, Decreased activity tolerance, Pain, Improper body mechanics, Impaired flexibility, Decreased strength, Postural dysfunction, Hypomobility, Decreased balance  Visit Diagnosis: Repeated falls  Chronic bilateral low back pain, unspecified whether sciatica present  Chronic pain of left knee  Muscle weakness (generalized)  Difficulty in walking, not elsewhere classified     Problem List Patient Active Problem List   Diagnosis Date Noted   Colon polyps 06/06/2020   Lupus (HCC) 06/06/2020   Allergic rhinitis due to animal (cat) (dog) hair and dander 04/08/2020   Allergic rhinitis due to pollen 04/08/2020   Food allergy 04/08/2020  Acute medial meniscus tear, left, subsequent encounter 03/21/2020   Chronic pain of left knee 02/15/2020   Paresthesia of skin 11/16/2019   History of total knee replacement, right 11/16/2019   Tobacco abuse 11/16/2019   Centrilobular emphysema (HCC) 08/10/2019   Incidental lung nodule, > 57mm and < 58mm 08/10/2019   OSA on CPAP 08/10/2019   Dyspnea on exertion 08/02/2019   Hyperlipidemia 08/02/2019   Lumbar radiculopathy 04/24/2019   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    Post laminectomy syndrome 02/23/2019   Abnormality of gait 02/23/2019   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   Unilateral primary osteoarthritis, left hip 12/28/2018   Lesion of skin of left ear 12/26/2018   Primary osteoarthritis of left hip 12/02/2018   Iron deficiency anemia 10/16/2018   Chronic pain syndrome 09/08/2018   Chronic pain of right knee 08/11/2018   Status post lumbar laminectomy 07/15/2018   Peripheral arterial disease (HCC) 04/05/2018   Moderate persistent asthma without complication 06/29/2017   Environmental and seasonal allergies 06/29/2017   Controlled type 2 diabetes mellitus with diabetic polyneuropathy, without long-term current use of insulin (HCC) 06/29/2017   Perennial allergic rhinitis 04/08/2017   Sensorineural hearing loss (SNHL), bilateral 04/08/2017   Chronic pansinusitis 03/25/2017   Eustachian tube dysfunction, bilateral 03/25/2017   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   Herniation of lumbar intervertebral disc with radiculopathy 10/02/2016    Class: Chronic   Alopecia areata 08/19/2016   Chondromalacia of both patellae 06/03/2015    Class: Chronic   Spinal stenosis, lumbar region, with neurogenic claudication 06/03/2015   Tobacco use disorder 04/25/2015   DJD (degenerative joint disease) of knee 01/04/2015   Hemorrhoid 11/14/2014   Gout of big toe 07/19/2014   Essential hypertension 08/14/2013   Gastroesophageal reflux disease without esophagitis 08/14/2013   COPD (chronic obstructive pulmonary disease) (HCC) 04/17/2011    Grayce Sessions, PTA 10/28/2020, 9:00 AM  [facility] [address] [place], [place], [address] Phone: [phone]   Fax:  [phone]  Name: Stacy [name] [facility] MRN: [id number] Date of Birth: September 24, 1960

## 2020-10-29 NOTE — Progress Notes (Signed)
  Subjective:  Patient ID: Stacy [name], female    DOB: 09/20/1960,  MRN: [id number]  Dorian Renfro Goldberger presents to clinic today for at risk foot care. Pt has h/o NIDDM with PAD and thick, elongated toenails b/l lower extremities which are tender when wearing enclosed shoe gear.  Patient states blood glucose was 113 mg/dl today.    She states her family is prepping to celebrate her birthday.  PCP is Marcine Matar, MD , and last visit was 06/06/2020.  Allergies  Allergen Reactions   Other Shortness Of Breath    UNSPECIFIED AGENTS Allergic to perfumes and cleaning products   Shellfish Allergy Anaphylaxis    Per allergy test.   Ace Inhibitors Cough and Other (See Comments)        Celecoxib     Other reaction(s): upset stomach   Shellfish-Derived Products Other (See Comments)   Aspirin Nausea Only    Other reaction(s): stomach upset Other reaction(s): Unknown    Review of Systems: Negative except as noted in the HPI. Objective:   Constitutional [place] Dillon Lor is a pleasant 60 y.o. African American female, WD, WN in NAD. AAO x 3.   Vascular Capillary refill time to digits <4 seconds b/l lower extremities. Faintly palpable pedal pulses b/l. Pedal hair absent. Lower extremity skin temperature gradient within normal limits. No pain with calf compression b/l. No edema noted b/l lower extremities. No cyanosis or clubbing noted.  Neurologic Normal speech. Oriented to person, place, and time. Pt has subjective symptoms of neuropathy. Protective sensation intact 5/5 intact bilaterally with 10g monofilament b/l.  Dermatologic Skin warm and supple b/l lower extremities. No open wounds b/l lower extremities. No interdigital macerations b/l lower extremities. Toenails 1-5 b/l elongated, discolored, dystrophic, thickened, crumbly with subungual debris and tenderness to dorsal palpation.  Orthopedic: Normal muscle strength 5/5 to all lower extremity muscle groups  bilaterally. Hallux valgus with bunion deformity noted b/l lower extremities.   Radiographs: None Assessment:   1. Pain due to onychomycosis of toenail   2. Type II diabetes mellitus with peripheral circulatory disorder [facility])    Plan:  Patient was evaluated and treated and all questions answered. Consent given for treatment as described below: -No new findings. No new orders. -Continue diabetic foot care principles: inspect feet daily, monitor glucose as recommended by PCP and/or Endocrinologist, and follow prescribed diet per PCP, Endocrinologist and/or dietician. -Patient to continue soft, supportive shoe gear daily. -Toenails 1-5 b/l were debrided in length and girth with sterile nail nippers and dremel without iatrogenic bleeding.  -Patient to report any pedal injuries to medical professional immediately. -Patient/POA to call should there be question/concern in the interim.  Return in about 3 months (around 01/24/2021).  [name], DPM

## 2020-10-30 DIAGNOSIS — F331 Major depressive disorder, recurrent, moderate: Secondary | ICD-10-CM | POA: Diagnosis not present

## 2020-10-31 ENCOUNTER — Other Ambulatory Visit (INDEPENDENT_AMBULATORY_CARE_PROVIDER_SITE_OTHER): Payer: Self-pay | Admitting: Specialist

## 2020-11-01 ENCOUNTER — Other Ambulatory Visit: Payer: Self-pay | Admitting: Cardiovascular Disease

## 2020-11-01 ENCOUNTER — Other Ambulatory Visit: Payer: Self-pay | Admitting: Internal Medicine

## 2020-11-01 ENCOUNTER — Other Ambulatory Visit (INDEPENDENT_AMBULATORY_CARE_PROVIDER_SITE_OTHER): Payer: Self-pay | Admitting: Specialist

## 2020-11-01 DIAGNOSIS — J3089 Other allergic rhinitis: Secondary | ICD-10-CM

## 2020-11-01 DIAGNOSIS — E1142 Type 2 diabetes mellitus with diabetic polyneuropathy: Secondary | ICD-10-CM

## 2020-11-04 ENCOUNTER — Other Ambulatory Visit: Payer: Self-pay

## 2020-11-04 ENCOUNTER — Encounter: Payer: Self-pay | Admitting: Physical Therapy

## 2020-11-04 ENCOUNTER — Ambulatory Visit: Payer: Medicaid Other | Attending: Specialist | Admitting: Physical Therapy

## 2020-11-04 DIAGNOSIS — M6281 Muscle weakness (generalized): Secondary | ICD-10-CM | POA: Insufficient documentation

## 2020-11-04 DIAGNOSIS — R262 Difficulty in walking, not elsewhere classified: Secondary | ICD-10-CM | POA: Insufficient documentation

## 2020-11-04 DIAGNOSIS — M545 Low back pain, unspecified: Secondary | ICD-10-CM | POA: Diagnosis not present

## 2020-11-04 DIAGNOSIS — G8929 Other chronic pain: Secondary | ICD-10-CM | POA: Insufficient documentation

## 2020-11-04 DIAGNOSIS — M25562 Pain in left knee: Secondary | ICD-10-CM | POA: Diagnosis not present

## 2020-11-04 DIAGNOSIS — R296 Repeated falls: Secondary | ICD-10-CM | POA: Insufficient documentation

## 2020-11-04 NOTE — Therapy (Signed)
[place]. [place], [place], [address] Phone: [phone]   Fax:  [phone]  Physical Therapy Treatment  Patient Details  Name: Stacy [name] MRN: [id number] Date of Birth: [birth year] Referring Provider (PT): [name], MD   Encounter Date: 11/04/2020   PT End of Session - 11/04/20 0842     Visit Number 3    Date for PT Re-Evaluation 12/16/20    Authorization Type Traditional Medicaid    PT Start Time 0758    PT Stop Time 0851    PT Time Calculation (min) 53 min    Activity Tolerance Patient tolerated treatment well    Behavior During Therapy [facility] for tasks assessed/performed             Past Medical History:  Diagnosis Date   Allergy    Shellfish, cleaning products   Anxiety    Arthritis    Arthrofibrosis of total knee replacement ([place])    right   Asthma    COPD (chronic obstructive pulmonary disease) ([place])    Depression    Diabetes mellitus    Type II   GERD (gastroesophageal reflux disease)    Pt on Protonix daily   Glaucoma    Gout    Headache(784.0)    otc meds prn   Hyperlipidemia    Hypertension    Irritable bowel syndrome 11/19/2010   Neuropathy    Pneumonia YRS AGO   Restless legs    Shortness of breath    07/14/2018- uses  4 times a day    Past Surgical History:  Procedure Laterality Date   BACK SURGERY     CHOLECYSTECTOMY     COLONOSCOPY     ENDOMETRIAL ABLATION  10/2010   EYE SURGERY     HERNIA REPAIR     umbicial hernia   JOINT REPLACEMENT Left 03/14/2019   Dr. Ninfa Linden hip   KNEE ARTHROSCOPY Left    06/07/2017 Dr. Marlou Sa of [place] ARTHROSCOPY Left 03/21/2020   Procedure: LEFT KNEE ARTHROSCOPY WITH PARTIAL MEDIAL MENISCECTOMY;  Surgeon: Mcarthur Rossetti, MD;  Location: [place];  Service: Orthopedics;  Laterality: Left;   KNEE CLOSED REDUCTION Right 12/06/2015   Procedure: CLOSED MANIPULATION RIGHT KNEE;  Surgeon:  [name], MD;  Location: [place];  Service: Orthopedics;  Laterality: Right;   KNEE CLOSED REDUCTION Right 01/17/2016   Procedure: CLOSED MANIPULATION RIGHT KNEE;  Surgeon: [name], MD;  Location: [place];  Service: Orthopedics;  Laterality: Right;   KNEE JOINT MANIPULATION Right 12/06/2015   LACRIMAL TUBE INSERTION Bilateral 03/01/2019   Procedure: LACRIMAL TUBE INSERTION;  Surgeon: Gevena Cotton, MD;  Location: [facility];  Service: Ophthalmology;  Laterality: Bilateral;   LACRIMAL TUBE REMOVAL Bilateral 05/03/2019   Procedure: BILATERAL NASOLACRIMAL DUCT PROBING, IIRIGATION AND TUBE REMOVAL BOTH EYES;  Surgeon: Gevena Cotton, MD;  Location: [place];  Service: Ophthalmology;  Laterality: Bilateral;   LUMBAR DISC SURGERY  06/03/2015   L 2  L3 L4 L5    LUMBAR LAMINECTOMY/DECOMPRESSION MICRODISCECTOMY N/A 06/03/2015   Procedure: Bilateral lateral recess decompression L2-3, L3-4, L4-5;  Surgeon: [name], MD;  Location: [place];  Service: Orthopedics;  Laterality: N/A;   LUMBAR LAMINECTOMY/DECOMPRESSION MICRODISCECTOMY N/A 10/02/2016   Procedure: Right L5-S1 Lateral Recess Decompression  microdiscectomy;  Surgeon: [name], MD;  Location: [place];  Service: Orthopedics;  Laterality: N/A;   LUMBAR LAMINECTOMY/DECOMPRESSION MICRODISCECTOMY  N/A 07/15/2018   Procedure: LEFT L3-4 MICRODISCECTOMY;  Surgeon: [name], MD;  Location: [place];  Service: Orthopedics;  Laterality: N/A;   svd      x 2   TEAR DUCT PROBING Bilateral 03/01/2019   Procedure: TEAR DUCT PROBING WITH IRRIGATION;  Surgeon: Gevena Cotton, MD;  Location: [facility];  Service: Ophthalmology;  Laterality: Bilateral;   TOTAL HIP ARTHROPLASTY Left 03/14/2019   Procedure: LEFT TOTAL HIP ARTHROPLASTY ANTERIOR APPROACH;  Surgeon: Mcarthur Rossetti, MD;  Location: [place];  Service: Orthopedics;  Laterality: Left;   TOTAL KNEE ARTHROPLASTY Right 09/06/2015   Procedure:  RIGHT TOTAL KNEE ARTHROPLASTY;  Surgeon: [name], MD;  Location: [place];  Service: Orthopedics;  Laterality: Right;   TUBAL LIGATION     UPPER GASTROINTESTINAL ENDOSCOPY  04/28/11    There were no vitals filed for this visit.   Subjective Assessment - 11/04/20 0758     Subjective "Three falls" Tuesday, Wednesday, and Thursday. No injuries reported did not seek medical attention. Reports L legs buckles and RLE wont bend some times.    Currently in Pain? No/denies                               [facility] Adult PT Treatment/Exercise - 11/04/20 0001       High Level Balance   High Level Balance Activities Side stepping   the length of mat table     Lumbar Exercises: Standing   Shoulder Extension Strengthening;Both;Theraband;15 reps   x2   Theraband Level (Shoulder Extension) Level 3 (Green)      Lumbar Exercises: Seated   Other Seated Lumbar Exercises rows green tband 2x15      Knee/Hip Exercises: Aerobic   Nustep L5 x 7 min      Knee/Hip Exercises: Seated   Long Arc Quad Strengthening;Both;2 sets;10 reps    Long Arc Quad Weight 3 lbs.    Ball Squeeze 2x10    Marching Both;2 sets;10 reps;Strengthening    Marching Weights 3 lbs.    Hamstring Curl Both;2 sets;15 reps    Hamstring Limitations green Tband    Abduction/Adduction  Strengthening;Both;2 sets;5 reps;15 reps    Abd/Adduction Limitations blue band    Sit to Sand 2 sets;with UE support;10 reps      Modalities   Modalities Cryotherapy      Cryotherapy   Number Minutes Cryotherapy 10 Minutes    Cryotherapy Location Lumbar Spine    Type of Cryotherapy Ice pack                       PT Short Term Goals - 11/04/20 0845       PT SHORT TERM GOAL #1   Title I with initial HEP    Status Partially Met               PT Long Term Goals - 10/21/20 1216       PT LONG TERM GOAL #1   Title I with advanced HEP    Time 8    Period Weeks    Status New    Target Date 12/16/20       PT LONG TERM GOAL #2   Title Patient to demonstrate lumbar AROM WFL and without pain limiting.    Time 8    Period Weeks    Status New    Target Date 12/16/20  PT LONG TERM GOAL #3   Title Patient to demonstrate B LE strength >/=4+/5.    Time 8    Period Weeks    Status New    Target Date 12/16/20      PT LONG TERM GOAL #4   Title Patient to demonstrate alternating reciprocal pattern when ascending and descending stairs with good stability and 1 handrail as needed.    Time 8    Period Weeks    Status New    Target Date 12/16/20      PT LONG TERM GOAL #5   Title Patient to report tolerance for 30 minutes of standing/walking without pain limiting.    Time 8    Period Weeks    Status New    Target Date 12/16/20      Additional Long Term Goals   Additional Long Term Goals Yes      PT LONG TERM GOAL #6   Title Patient to report 50% improvement in frequency of L knee buckling.    Time 8    Period Weeks    Status New    Target Date 12/16/20                   Plan - 11/04/20 0842     Clinical Impression Statement Pt reports three falls last week, but no injuries sustained. LOB x1 today LLE buckling requiring min to mod assist to recover. Increase reps and or resistance tolerated with all therapeutic exercise interventions. Side step performed with SBA and in front of mat table for added safety.    Personal Factors and Comorbidities Age;Comorbidity 3+;Time since onset of injury/illness/exacerbation;Past/Current Experience;Fitness    Comorbidities R TKA with manipulation under anesthesia 2017, asthma, COPD, DM, gout, HA, HTN, neuropathy, lumbar lami/decompression L2-5 2017, L L5-S1 lateral decompression, L THA 2021, L knee scope with partial medial menisectomy 03/21/20    Examination-Activity Limitations Locomotion Level;Transfers;Bend;Carry;Squat;Stairs;Hygiene/Grooming;Stand;Lift    Examination-Participation Restrictions Laundry;Shop;Community  Activity;Cleaning;Meal Prep;Church    Stability/Clinical Decision Making Evolving/Moderate complexity    Rehab Potential Good    PT Frequency 1x / week    PT Duration 8 weeks    PT Treatment/Interventions ADLs/Self Care Home Management;Cryotherapy;Electrical Stimulation;Moist Heat;Traction;Ultrasound;Functional mobility training;Patient/family education;Therapeutic exercise;Therapeutic activities;Manual techniques;Dry needling;Neuromuscular re-education;Passive range of motion;Iontophoresis 39m/ml Dexamethasone;Gait training;Stair training;Balance training;Energy conservation;Vasopneumatic Device;Taping    PT Next Visit Plan LE and core strengthening, lumbopelvic mobility             Patient will benefit from skilled therapeutic intervention in order to improve the following deficits and impairments:  Abnormal gait, Decreased range of motion, Difficulty walking, Increased muscle spasms, Decreased activity tolerance, Pain, Improper body mechanics, Impaired flexibility, Decreased strength, Postural dysfunction, Hypomobility, Decreased balance  Visit Diagnosis: Repeated falls  Chronic pain of left knee  Chronic bilateral low back pain, unspecified whether sciatica present  Muscle weakness (generalized)  Difficulty in walking, not elsewhere classified     Problem List Patient Active Problem List   Diagnosis Date Noted   Colon polyps 06/06/2020   Lupus ([place] 06/06/2020   Allergic rhinitis due to animal (cat) (dog) hair and dander 04/08/2020   Allergic rhinitis due to pollen 04/08/2020   Food allergy 04/08/2020   Acute medial meniscus tear, left, subsequent encounter 03/21/2020   Chronic pain of left knee 02/15/2020   Paresthesia of skin 11/16/2019   History of total knee replacement, right 11/16/2019   Tobacco abuse 11/16/2019   Centrilobular emphysema ([place] 08/10/2019   Incidental lung nodule, > 354m  and < 70m 08/10/2019   OSA on CPAP 08/10/2019   Dyspnea on exertion  08/02/2019   Hyperlipidemia 08/02/2019   Lumbar radiculopathy 04/24/2019   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   Post laminectomy syndrome 02/23/2019   Abnormality of gait 02/23/2019   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   Unilateral primary osteoarthritis, left hip 12/28/2018   Lesion of skin of left ear 12/26/2018   Primary osteoarthritis of left hip 12/02/2018   Iron deficiency anemia 10/16/2018   Chronic pain syndrome 09/08/2018   Chronic pain of right knee 08/11/2018   Status post lumbar laminectomy 07/15/2018   Peripheral arterial disease ([place] 04/05/2018   Moderate persistent asthma without complication 034/91/7915  Environmental and seasonal allergies 06/29/2017   Controlled type 2 diabetes mellitus with diabetic polyneuropathy, without long-term current use of insulin ([place] 06/29/2017   Perennial allergic rhinitis 04/08/2017   Sensorineural hearing loss (SNHL), bilateral 04/08/2017   Chronic pansinusitis 03/25/2017   Eustachian tube dysfunction, bilateral 005/69/7948  Lichen planopilaris 001/65/5374  Herniation of lumbar intervertebral disc with radiculopathy 10/02/2016    Class: Chronic   Alopecia areata 08/19/2016   Chondromalacia of both patellae 06/03/2015    Class: Chronic   Spinal stenosis, lumbar region, with neurogenic claudication 06/03/2015   Tobacco use disorder 04/25/2015   DJD (degenerative joint disease) of knee 01/04/2015   Hemorrhoid 11/14/2014   Gout of big toe 07/19/2014   Essential hypertension 08/14/2013   Gastroesophageal reflux disease without esophagitis 08/14/2013   COPD (chronic obstructive pulmonary disease) ([place] 04/17/2011    RScot Jun PTA 11/04/2020, 8:46 AM  [place] [address]Phone: [phone]  Fax:  [phone] Name: PDerrian RodakPPanola Endoscopy Center LLCMRN: [id number] of Birth: [birth year]

## 2020-11-06 DIAGNOSIS — F331 Major depressive disorder, recurrent, moderate: Secondary | ICD-10-CM | POA: Diagnosis not present

## 2020-11-11 ENCOUNTER — Encounter: Payer: Self-pay | Admitting: Physical Therapy

## 2020-11-11 ENCOUNTER — Ambulatory Visit: Payer: Medicaid Other | Admitting: Physical Therapy

## 2020-11-11 ENCOUNTER — Other Ambulatory Visit: Payer: Self-pay

## 2020-11-11 DIAGNOSIS — M6281 Muscle weakness (generalized): Secondary | ICD-10-CM

## 2020-11-11 DIAGNOSIS — R296 Repeated falls: Secondary | ICD-10-CM

## 2020-11-11 DIAGNOSIS — G8929 Other chronic pain: Secondary | ICD-10-CM | POA: Diagnosis not present

## 2020-11-11 DIAGNOSIS — R262 Difficulty in walking, not elsewhere classified: Secondary | ICD-10-CM | POA: Diagnosis not present

## 2020-11-11 DIAGNOSIS — M545 Low back pain, unspecified: Secondary | ICD-10-CM

## 2020-11-11 DIAGNOSIS — M25562 Pain in left knee: Secondary | ICD-10-CM | POA: Diagnosis not present

## 2020-11-11 NOTE — Therapy (Signed)
[facility] [place] [address] [place], [place], [address] Phone: [phone]   Fax:  [phone]  Physical Therapy Treatment  Patient Details  Name: Stacy [name] MRN: [id number] Date of Birth: 03-25-1960 Referring Provider (PT): Kerrin Champagne, MD   Encounter Date: 11/11/2020   PT End of Session - 11/11/20 0839     Visit Number 4    Date for PT Re-Evaluation 12/16/20    Authorization Type Traditional Medicaid    PT Start Time 0800    PT Stop Time 0845    PT Time Calculation (min) 45 min    Activity Tolerance Patient tolerated treatment well    Behavior During Therapy [facility] for tasks assessed/performed             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   Asthma    COPD (chronic obstructive pulmonary disease) (HCC)    Depression    Diabetes mellitus    Type II   GERD (gastroesophageal reflux disease)    Pt on Protonix daily   Glaucoma    Gout    Headache(784.0)    otc meds prn   Hyperlipidemia    Hypertension    Irritable bowel syndrome 11/19/2010   Neuropathy    Pneumonia YRS AGO   Restless legs    Shortness of breath    07/14/2018- uses  4 times a day    Past Surgical History:  Procedure Laterality Date   BACK SURGERY     CHOLECYSTECTOMY     COLONOSCOPY     ENDOMETRIAL ABLATION  10/2010   EYE SURGERY     HERNIA REPAIR     umbicial hernia   JOINT REPLACEMENT Left 03/14/2019   Dr. Magnus Ivan hip   KNEE ARTHROSCOPY Left    06/07/2017 Dr. August [facility] [place] [facility]   KNEE ARTHROSCOPY Left 03/21/2020   Procedure: LEFT KNEE ARTHROSCOPY WITH PARTIAL MEDIAL MENISCECTOMY;  Surgeon: [name], MD;  Location: [facility];  Service: Orthopedics;  Laterality: Left;   KNEE CLOSED REDUCTION Right 12/06/2015   Procedure: CLOSED MANIPULATION RIGHT KNEE;  Surgeon:  Kerrin Champagne, MD;  [facility];  Service: Orthopedics;  Laterality: Right;   KNEE CLOSED REDUCTION Right 01/17/2016   Procedure: CLOSED MANIPULATION RIGHT KNEE;  Surgeon: Kerrin Champagne, MD;  [facility];  Service: Orthopedics;  Laterality: Right;   KNEE JOINT MANIPULATION Right 12/06/2015   LACRIMAL TUBE INSERTION Bilateral 03/01/2019   Procedure: LACRIMAL TUBE INSERTION;  Surgeon: Aura Camps, MD;  Location: [facility];  Service: Ophthalmology;  Laterality: Bilateral;   LACRIMAL TUBE REMOVAL Bilateral 05/03/2019   Procedure: BILATERAL NASOLACRIMAL DUCT PROBING, IIRIGATION AND TUBE REMOVAL BOTH EYES;  Surgeon: Aura Camps, MD;  [facility];  Service: Ophthalmology;  Laterality: Bilateral;   LUMBAR DISC SURGERY  06/03/2015   L 2  L3 L4 L5    LUMBAR LAMINECTOMY/DECOMPRESSION MICRODISCECTOMY N/A 06/03/2015   Procedure: Bilateral lateral recess decompression L2-3, L3-4, L4-5;  Surgeon: Kerrin Champagne, MD;  [facility];  Service: Orthopedics;  Laterality: N/A;   LUMBAR LAMINECTOMY/DECOMPRESSION MICRODISCECTOMY N/A 10/02/2016   Procedure: Right L5-S1 Lateral Recess Decompression  microdiscectomy;  Surgeon: Kerrin Champagne, MD;  Location[facility];  Service: Orthopedics;  Laterality: N/A;   LUMBAR LAMINECTOMY/DECOMPRESSION MICRODISCECTOMY  N/A 07/15/2018   Procedure: LEFT L3-4 MICRODISCECTOMY;  Surgeon: Kerrin Champagne, MD;  Location: [facility];  Service: Orthopedics;  Laterality: N/A;   svd      x 2   TEAR DUCT PROBING Bilateral 03/01/2019   Procedure: TEAR DUCT PROBING WITH IRRIGATION;  Surgeon: Aura Camps, MD;  Location: [facility];  Service: Ophthalmology;  Laterality: Bilateral;   TOTAL HIP ARTHROPLASTY Left 03/14/2019   Procedure: LEFT TOTAL HIP ARTHROPLASTY ANTERIOR APPROACH;  Surgeon: [name], MD;  [facility];  Service: Orthopedics;  Laterality: Left;   TOTAL KNEE ARTHROPLASTY Right 09/06/2015   Procedure:  RIGHT TOTAL KNEE ARTHROPLASTY;  Surgeon: Kerrin Champagne, MD;  [facility];  Service: Orthopedics;  Laterality: Right;   TUBAL LIGATION     UPPER GASTROINTESTINAL ENDOSCOPY  04/28/11    There were no vitals filed for this visit.   Subjective Assessment - 11/11/20 0801     Subjective feeling pretty good this morning. Reports falling Saturday trying to get out of the bed Saturday morning, No injuries.    Currently in Pain? No/denies                [facility] PT Assessment - 11/11/20 0001       AROM   Lumbar Flexion WFL    Lumbar Extension [facility]    Lumbar - Right Side Bend [facility]    Lumbar - Left Side Bend [facility]    Lumbar - Right Rotation [facility]    Lumbar - Left Rotation [facility]                           [facility] Adult PT Treatment/Exercise - 11/11/20 0001       Lumbar Exercises: Standing   Shoulder Extension Strengthening;Both;Theraband;15 reps   x2   Theraband Level (Shoulder Extension) Level 3 (Green)      Knee/Hip Exercises: Aerobic   Recumbent Bike L1 x 2 min    Nustep L5 x 6 min      Knee/Hip Exercises: Machines for Strengthening   Cybex Knee Extension 10lb 1x10    Cybex Knee Flexion 25lb 2x10      Knee/Hip Exercises: Standing   Hip Abduction Both;2 sets;10 reps;Knee straight;Stengthening    Other Standing Knee Exercises Harches 2x10 HHA x1      Knee/Hip Exercises: Seated   Sit to Sand 10 reps;without UE support;3 sets   holding yellow ball     Modalities   Modalities Cryotherapy      Cryotherapy   Number Minutes Cryotherapy 10 Minutes    Cryotherapy Location Lumbar Spine    Type of Cryotherapy Ice pack                       PT Short Term Goals - 11/11/20 0819       PT SHORT TERM GOAL #1   Title I with initial HEP    Status Achieved               PT Long Term Goals - 11/11/20 1610       PT LONG TERM GOAL #2   Title Patient to demonstrate lumbar AROM WFL and without pain limiting.    Status Achieved                    Plan - 11/11/20 0840     Clinical Impression Statement Pt has progressed increasing her lumbar ROM meeting  goal. She reports sone fall this weekend getting out of bed. She reports some difficulty with leg extensions reporting her LLE didn't want to bent at times. No issue with standing marches. Pt able to tolerated some resistance with sit to stands. Cues to prevent leaning with hip abduction.    Personal Factors and Comorbidities Age;Comorbidity 3+;Time since onset of injury/illness/exacerbation;Past/Current Experience;Fitness    Comorbidities R TKA with manipulation under anesthesia 2017, asthma, COPD, DM, gout, HA, HTN, neuropathy, lumbar lami/decompression L2-5 2017, L L5-S1 lateral decompression, L THA 2021, L knee scope with partial medial menisectomy 03/21/20    Examination-Activity Limitations Locomotion Level;Transfers;Bend;Carry;Squat;Stairs;Hygiene/Grooming;Stand;Lift    Examination-Participation Restrictions Laundry;Shop;Community Activity;Cleaning;Meal Prep;Church    Stability/Clinical Decision Making Evolving/Moderate complexity    Rehab Potential Good    PT Frequency 1x / week    PT Duration 8 weeks    PT Treatment/Interventions ADLs/Self Care Home Management;Cryotherapy;Electrical Stimulation;Moist Heat;Traction;Ultrasound;Functional mobility training;Patient/family education;Therapeutic exercise;Therapeutic activities;Manual techniques;Dry needling;Neuromuscular re-education;Passive range of motion;Iontophoresis 4mg /ml Dexamethasone;Gait training;Stair training;Balance training;Energy conservation;Vasopneumatic Device;Taping    PT Next Visit Plan LE and core strengthening, lumbopelvic mobility             Patient will benefit from skilled therapeutic intervention in order to improve the following deficits and impairments:     Visit Diagnosis: Chronic bilateral low back pain, unspecified whether sciatica present  Muscle weakness (generalized)  Chronic pain  of left knee  Repeated falls     Problem List Patient Active Problem List   Diagnosis Date Noted   Colon polyps 06/06/2020   Lupus (HCC) 06/06/2020   Allergic rhinitis due to animal (cat) (dog) hair and dander 04/08/2020   Allergic rhinitis due to pollen 04/08/2020   Food allergy 04/08/2020   Acute medial meniscus tear, left, subsequent encounter 03/21/2020   Chronic pain of left knee 02/15/2020   Paresthesia of skin 11/16/2019   History of total knee replacement, right 11/16/2019   Tobacco abuse 11/16/2019   Centrilobular emphysema (HCC) 08/10/2019   Incidental lung nodule, > 66mm and < 78mm 08/10/2019   OSA on CPAP 08/10/2019   Dyspnea on exertion 08/02/2019   Hyperlipidemia 08/02/2019   Lumbar radiculopathy 04/24/2019   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   Post laminectomy syndrome 02/23/2019   Abnormality of gait 02/23/2019   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   Unilateral primary osteoarthritis, left hip 12/28/2018   Lesion of skin of left ear 12/26/2018   Primary osteoarthritis of left hip 12/02/2018   Iron deficiency anemia 10/16/2018   Chronic pain syndrome 09/08/2018   Chronic pain of right knee 08/11/2018   Status post lumbar laminectomy 07/15/2018   Peripheral arterial disease (HCC) 04/05/2018   Moderate persistent asthma without complication 06/29/2017   Environmental and seasonal allergies 06/29/2017   Controlled type 2 diabetes mellitus with diabetic polyneuropathy, without long-term current use of insulin (HCC) 06/29/2017   Perennial allergic rhinitis 04/08/2017   Sensorineural hearing loss (SNHL), bilateral 04/08/2017   Chronic pansinusitis 03/25/2017   Eustachian tube dysfunction, bilateral 03/25/2017   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   Herniation of lumbar intervertebral disc with radiculopathy 10/02/2016    Class: Chronic   Alopecia areata 08/19/2016   Chondromalacia of both patellae 06/03/2015    Class: Chronic   Spinal stenosis, lumbar  region, with neurogenic claudication 06/03/2015   Tobacco use disorder 04/25/2015   DJD (degenerative joint disease) of knee 01/04/2015   Hemorrhoid 11/14/2014   Gout of big toe 07/19/2014   Essential hypertension 08/14/2013   Gastroesophageal reflux disease  without esophagitis 08/14/2013   COPD (chronic obstructive pulmonary disease) (HCC) 04/17/2011    Grayce Sessions, PTA 11/11/2020, 8:43 AM  [facility] [address] [place], [place], [address] Phone: [phone]   Fax:  [phone]  Name: Stacy [name] [facility] MRN: [id number] Date of Birth: 1960/05/09

## 2020-11-13 DIAGNOSIS — F331 Major depressive disorder, recurrent, moderate: Secondary | ICD-10-CM | POA: Diagnosis not present

## 2020-11-14 ENCOUNTER — Other Ambulatory Visit: Payer: Self-pay

## 2020-11-14 ENCOUNTER — Ambulatory Visit (INDEPENDENT_AMBULATORY_CARE_PROVIDER_SITE_OTHER): Payer: Medicaid Other | Admitting: Specialist

## 2020-11-14 ENCOUNTER — Encounter: Payer: Self-pay | Admitting: Specialist

## 2020-11-14 VITALS — BP 110/75 | HR 87 | Ht 66.0 in | Wt 167.0 lb

## 2020-11-14 DIAGNOSIS — M5136 Other intervertebral disc degeneration, lumbar region: Secondary | ICD-10-CM | POA: Diagnosis not present

## 2020-11-14 DIAGNOSIS — Z9889 Other specified postprocedural states: Secondary | ICD-10-CM | POA: Diagnosis not present

## 2020-11-14 DIAGNOSIS — M48062 Spinal stenosis, lumbar region with neurogenic claudication: Secondary | ICD-10-CM

## 2020-11-14 DIAGNOSIS — M4316 Spondylolisthesis, lumbar region: Secondary | ICD-10-CM | POA: Diagnosis not present

## 2020-11-14 DIAGNOSIS — M4807 Spinal stenosis, lumbosacral region: Secondary | ICD-10-CM

## 2020-11-14 DIAGNOSIS — M1712 Unilateral primary osteoarthritis, left knee: Secondary | ICD-10-CM | POA: Diagnosis not present

## 2020-11-14 DIAGNOSIS — M4156 Other secondary scoliosis, lumbar region: Secondary | ICD-10-CM

## 2020-11-14 NOTE — Patient Instructions (Signed)
Plan: Avoid bending, stooping and avoid lifting weights greater than 10 lbs. Avoid prolong standing and walking. Avoid frequent bending and stooping  No lifting greater than 10 lbs. May use ice or moist heat for pain. Weight loss is of benefit. Handicap license is approved. Has completed 3-4 weeks of PT without improvement in symptoms will schedule MRI, no contrast due to shellfish Allergy with anaphylaxis.

## 2020-11-14 NOTE — Progress Notes (Signed)
Office Visit Note   Patient: Stacy [name]           Date of Birth: 02-26-1960           MRN: [id number] Visit Date: 11/14/2020              Requested by: Marcine Matar, MD [address] [place],  [place] [address] PCP: Marcine Matar, MD   Assessment & Plan: Visit Diagnoses:  1. S/P left knee arthroscopy   2. Osteoarthritis of left patellofemoral joint   3. Spinal stenosis, lumbar region, with neurogenic claudication   4. Spondylolisthesis, lumbar region     Plan: Avoid bending, stooping and avoid lifting weights greater than 10 lbs. Avoid prolong standing and walking. Avoid frequent bending and stooping  No lifting greater than 10 lbs. May use ice or moist heat for pain. Weight loss is of benefit. Handicap license is approved. Has completed 3-4 weeks of PT without improvement in symptoms will schedule MRI, no contrast due to shellfish Allergy with anaphylaxis.      Follow-Up Instructions: No follow-ups on file.   Orders:  No orders of the defined types were placed in this encounter.  No orders of the defined types were placed in this encounter.     Procedures: No procedures performed   Clinical Data: No additional findings.   Subjective: Chief Complaint  Patient presents with  . Lower Back - Follow-up    60 year old female with history of bilateral knee OA post right TKA for severe OA of the right P-F joint, she has left knee Pain as well but findings are not as impressive interms of the degree of left knee OA. She has back pain and some neurogenic Claudication symptoms and eventually had bilateral hemilaminectomies at the L2-3, L3-4 and L4-5 in 2015, then right L5-S1 lateral  Recess decompression and discectomy 2018  and last surgery was left L3-4 microdiscectomy for HNP. The left leg numbness and tingling is returning into the left leg and it is worse with standing and walking.  Without the walker she can not walk to far has to lean  over or to the right side to relieve the pain. She reports just walking from her house to the parking lot worsens the pain and that is less than 100 feet. No bowel or bladder Changes. There is no pain with coughing or sneezing. Getting half way up the parking lot at her home to get her granddaughter off the bus she has to stop due to back and left leg pain.  She has been to PT for nearly 3 weeks without improvement in her symptoms.    Review of Systems  Constitutional: Negative.   HENT: Negative.    Eyes: Negative.   Respiratory: Negative.    Cardiovascular: Negative.   Gastrointestinal: Negative.   Endocrine: Negative.   Genitourinary: Negative.   Musculoskeletal: Negative.   Skin: Negative.   Allergic/Immunologic: Negative.   Neurological: Negative.   Hematological: Negative.   Psychiatric/Behavioral: Negative.      Objective: Vital Signs: BP 110/75 (BP Location: Left Arm, Patient Position: Sitting)   Pulse 87   Ht 5\' 6"  (1.676 m)   Wt 167 lb (75.8 kg)   LMP 09/01/2010   BMI 26.95 kg/m   Physical Exam Constitutional:      Appearance: She is well-developed.  HENT:     Head: Normocephalic and atraumatic.  Eyes:     Pupils: Pupils are equal, round, and reactive to  light.  Pulmonary:     Effort: Pulmonary effort is normal.     Breath sounds: Normal breath sounds.  Abdominal:     General: Bowel sounds are normal.     Palpations: Abdomen is soft.  Musculoskeletal:     Cervical back: Normal range of motion and neck supple.     Lumbar back: Negative right straight leg raise test and negative left straight leg raise test.  Skin:    General: Skin is warm and dry.  Neurological:     Mental Status: She is alert and oriented to person, place, and time.  Psychiatric:        Behavior: Behavior normal.        Thought Content: Thought content normal.        Judgment: Judgment normal.    Back Exam   Tenderness  The patient is experiencing tenderness in the lumbar.  Range  of Motion  Extension:  abnormal  Flexion:  normal  Lateral bend right:  abnormal  Lateral bend left:  abnormal  Rotation right:  abnormal  Rotation left:  abnormal   Muscle Strength  Right Quadriceps:  5/5  Left Quadriceps:  5/5  Right Hamstrings:  5/5  Left Hamstrings:  5/5   Tests  Straight leg raise right: negative Straight leg raise left: negative  Reflexes  Patellar:  2/4 Achilles:  2/4     Specialty Comments:  No specialty comments available.  Imaging: No results found.   PMFS History: Patient Active Problem List   Diagnosis Date Noted  . Herniation of lumbar intervertebral disc with radiculopathy 10/02/2016    Priority: 1.    Class: Chronic  . Chondromalacia of both patellae 06/03/2015    Priority: 1.    Class: Chronic  . Colon polyps 06/06/2020  . Lupus (HCC) 06/06/2020  . Allergic rhinitis due to animal (cat) (dog) hair and dander 04/08/2020  . Allergic rhinitis due to pollen 04/08/2020  . Food allergy 04/08/2020  . Acute medial meniscus tear, left, subsequent encounter 03/21/2020  . Chronic pain of left knee 02/15/2020  . Paresthesia of skin 11/16/2019  . History of total knee replacement, right 11/16/2019  . Tobacco abuse 11/16/2019  . Centrilobular emphysema (HCC) 08/10/2019  . Incidental lung nodule, > 69mm and < 5mm 08/10/2019  . OSA on CPAP 08/10/2019  . Dyspnea on exertion 08/02/2019  . Hyperlipidemia 08/02/2019  . Lumbar radiculopathy 04/24/2019  . 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  . Post laminectomy syndrome 02/23/2019  . Abnormality of gait 02/23/2019  . 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 Unilateral primary osteoarthritis, left hip 12/28/2018  . Lesion of skin of left ear 12/26/2018  . Primary osteoarthritis of left hip 12/02/2018  . Iron deficiency anemia 10/16/2018  . Chronic pain syndrome 09/08/2018  . Chronic pain of right knee 08/11/2018  . Status post lumbar laminectomy 07/15/2018  . Peripheral arterial disease  (HCC) 04/05/2018  . Moderate persistent asthma without complication 06/29/2017  . Environmental and seasonal allergies 06/29/2017  . Controlled type 2 diabetes mellitus with diabetic polyneuropathy, without long-term current use of insulin (HCC) 06/29/2017  . Perennial allergic rhinitis 04/08/2017  . Sensorineural hearing loss (SNHL), bilateral 04/08/2017  . Chronic pansinusitis 03/25/2017  . Eustachian tube dysfunction, bilateral 03/25/2017  . 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 Alopecia areata 08/19/2016  . Spinal stenosis, lumbar region, with neurogenic claudication 06/03/2015  . Tobacco use disorder 04/25/2015  . DJD (degenerative joint disease) of knee 01/04/2015  . Hemorrhoid 11/14/2014  .  Gout of big toe 07/19/2014  . Essential hypertension 08/14/2013  . Gastroesophageal reflux disease without esophagitis 08/14/2013  . COPD (chronic obstructive pulmonary disease) (HCC) 04/17/2011   Past Medical History:  Diagnosis Date  . Allergy    Shellfish, cleaning products  . Anxiety   . Arthritis   . 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 Asthma   . COPD (chronic obstructive pulmonary disease) (HCC)   . Depression   . Diabetes mellitus    Type II  . GERD (gastroesophageal reflux disease)    Pt on Protonix daily  . Glaucoma   . Gout   . Headache(784.0)    otc meds prn  . Hyperlipidemia   . Hypertension   . Irritable bowel syndrome 11/19/2010  . Neuropathy   . Pneumonia YRS AGO  . Restless legs   . Shortness of breath    07/14/2018- uses  4 times a day    Family History  Problem Relation Age of Onset  . Hypertension Father   . Cancer Father   . Heart disease Mother   . Asthma Son        had as a child  . Heart disease Sister   . Breast cancer Sister   . Hypertension Brother     Past Surgical History:  Procedure Laterality Date  . BACK SURGERY    . CHOLECYSTECTOMY    . COLONOSCOPY    . ENDOMETRIAL ABLATION  10/2010  . EYE SURGERY    . HERNIA  REPAIR     umbicial hernia  . JOINT REPLACEMENT Left 03/14/2019   Dr. Magnus Ivan hip  . KNEE ARTHROSCOPY Left    06/07/2017 Dr. August Saucer of [facility]  . KNEE ARTHROSCOPY Left 03/21/2020   Procedure: LEFT KNEE ARTHROSCOPY WITH PARTIAL MEDIAL MENISCECTOMY;  Surgeon: [name], MD;  Location: [facility];  Service: Orthopedics;  Laterality: Left;  . KNEE CLOSED REDUCTION Right 12/06/2015   Procedure: CLOSED MANIPULATION RIGHT KNEE;  Surgeon: Kerrin Champagne, MD;  Location: [facility];  Service: Orthopedics;  Laterality: Right;  . KNEE CLOSED REDUCTION Right 01/17/2016   Procedure: CLOSED MANIPULATION RIGHT KNEE;  Surgeon: Kerrin Champagne, MD;  Location: [facility];  Service: Orthopedics;  Laterality: Right;  . KNEE JOINT MANIPULATION Right 12/06/2015  . LACRIMAL TUBE INSERTION Bilateral 03/01/2019   Procedure: LACRIMAL TUBE INSERTION;  Surgeon: Aura Camps, MD;  Location[facility];  Service: Ophthalmology;  Laterality: Bilateral;  . LACRIMAL TUBE REMOVAL Bilateral 05/03/2019   Procedure: BILATERAL NASOLACRIMAL DUCT PROBING, IIRIGATION AND TUBE REMOVAL BOTH EYES;  Surgeon: Aura Camps, MD;  Location: [facility];  Service: Ophthalmology;  Laterality: Bilateral;  . LUMBAR DISC SURGERY  06/03/2015   L 2  L3 L4 L5   . LUMBAR LAMINECTOMY/DECOMPRESSION MICRODISCECTOMY N/A 06/03/2015   Procedure: Bilateral lateral recess decompression L2-3, L3-4, L4-5;  Surgeon: Kerrin Champagne, MD;  Location: [facility];  Service: Orthopedics;  Laterality: N/A;  . LUMBAR LAMINECTOMY/DECOMPRESSION MICRODISCECTOMY N/A 10/02/2016   Procedure: Right L5-S1 Lateral Recess Decompression  microdiscectomy;  Surgeon: Kerrin Champagne, MD;  [facility];  Service: Orthopedics;  Laterality: N/A;  . LUMBAR LAMINECTOMY/DECOMPRESSION MICRODISCECTOMY N/A 07/15/2018   Procedure: LEFT L3-4 MICRODISCECTOMY;  Surgeon: Kerrin Champagne, MD;  Location: [facility];  Service: Orthopedics;  Laterality: N/A;  .  svd      x 2  . TEAR DUCT PROBING Bilateral 03/01/2019   Procedure: TEAR DUCT PROBING WITH  IRRIGATION;  Surgeon: Aura Camps, MD;  Location[facility];  Service: Ophthalmology;  Laterality: Bilateral;  . TOTAL HIP ARTHROPLASTY Left 03/14/2019   Procedure: LEFT TOTAL HIP ARTHROPLASTY ANTERIOR APPROACH;  Surgeon: [name], MD;  Location: [facility];  Service: Orthopedics;  Laterality: Left;  . TOTAL KNEE ARTHROPLASTY Right 09/06/2015   Procedure: RIGHT TOTAL KNEE ARTHROPLASTY;  Surgeon: Kerrin Champagne, MD;  Location: [facility];  Service: Orthopedics;  Laterality: Right;  . TUBAL LIGATION    . UPPER GASTROINTESTINAL ENDOSCOPY  04/28/11   Social History   Occupational History  . Occupation: unemployed    Associate Professor: UNEMPLOYED  Tobacco Use  . Smoking status: Some Days    Packs/day: 0.25    Years: 32.00    Pack years: 8.00    Types: Cigarettes  . Smokeless tobacco: Never  . Tobacco comments:    DOWN TO 3 PER DAY  Vaping Use  . Vaping Use: Never used  Substance and Sexual Activity  . Alcohol use: No  . Drug use: No  . Sexual activity: Yes    Birth control/protection: Surgical, Post-menopausal    Comment: tubal ligation

## 2020-11-18 ENCOUNTER — Ambulatory Visit: Payer: Medicaid Other | Admitting: Physical Therapy

## 2020-11-20 ENCOUNTER — Ambulatory Visit: Payer: Self-pay

## 2020-11-20 ENCOUNTER — Ambulatory Visit: Payer: Medicaid Other | Attending: Internal Medicine

## 2020-11-20 ENCOUNTER — Other Ambulatory Visit: Payer: Self-pay

## 2020-11-20 DIAGNOSIS — F331 Major depressive disorder, recurrent, moderate: Secondary | ICD-10-CM | POA: Diagnosis not present

## 2020-11-20 DIAGNOSIS — Z23 Encounter for immunization: Secondary | ICD-10-CM | POA: Diagnosis not present

## 2020-11-25 ENCOUNTER — Ambulatory Visit: Payer: Medicaid Other | Admitting: Physical Therapy

## 2020-11-27 DIAGNOSIS — F331 Major depressive disorder, recurrent, moderate: Secondary | ICD-10-CM | POA: Diagnosis not present

## 2020-11-28 ENCOUNTER — Other Ambulatory Visit: Payer: Self-pay

## 2020-11-28 ENCOUNTER — Ambulatory Visit
Admission: RE | Admit: 2020-11-28 | Discharge: 2020-11-28 | Disposition: A | Discharge status: Home / Self Care | Payer: Medicaid Other | Source: Ambulatory Visit | Attending: Specialist | Admitting: Specialist

## 2020-11-28 DIAGNOSIS — M5136 Other intervertebral disc degeneration, lumbar region: Secondary | ICD-10-CM

## 2020-11-28 DIAGNOSIS — M545 Low back pain, unspecified: Secondary | ICD-10-CM | POA: Diagnosis not present

## 2020-11-28 DIAGNOSIS — M48061 Spinal stenosis, lumbar region without neurogenic claudication: Secondary | ICD-10-CM | POA: Diagnosis not present

## 2020-11-28 DIAGNOSIS — M4807 Spinal stenosis, lumbosacral region: Secondary | ICD-10-CM

## 2020-12-02 ENCOUNTER — Other Ambulatory Visit (INDEPENDENT_AMBULATORY_CARE_PROVIDER_SITE_OTHER): Payer: Self-pay | Admitting: Surgery

## 2020-12-02 ENCOUNTER — Other Ambulatory Visit: Payer: Self-pay | Admitting: Internal Medicine

## 2020-12-02 ENCOUNTER — Ambulatory Visit: Payer: Medicaid Other | Admitting: Physical Therapy

## 2020-12-02 ENCOUNTER — Other Ambulatory Visit: Payer: Self-pay | Admitting: Physical Medicine & Rehabilitation

## 2020-12-02 ENCOUNTER — Other Ambulatory Visit: Payer: Self-pay | Admitting: Specialist

## 2020-12-02 DIAGNOSIS — F17200 Nicotine dependence, unspecified, uncomplicated: Secondary | ICD-10-CM

## 2020-12-02 DIAGNOSIS — E119 Type 2 diabetes mellitus without complications: Secondary | ICD-10-CM

## 2020-12-02 NOTE — Telephone Encounter (Signed)
Requested Prescriptions  Pending Prescriptions Disp Refills  . potassium chloride SA (KLOR-CON) 20 MEQ tablet [Pharmacy Med Name: POTASSIUM CHLORIDE CRYS ER 20 MEQ ORAL TABLET EXTENDED RELEASE] 90 tablet 1    Sig: TAKE ONE TABLET BY MOUTH ONCE DAILY (NOON)     Endocrinology:  Minerals - Potassium Supplementation Passed - 12/02/2020  6:36 PM      Passed - K in normal range and within 360 days    Potassium  Date Value Ref Range Status  03/18/2020 3.7 3.5 - 5.1 mmol/L Final         Passed - Cr in normal range and within 360 days    Creat  Date Value Ref Range Status  03/11/2016 0.90 0.50 - 1.05 mg/dL Final    Comment:      For patients > or = 61 years of age: The upper reference limit for Creatinine is approximately 13% higher for people identified as African-American.      Creatinine, Ser  Date Value Ref Range Status  03/18/2020 0.74 0.44 - 1.00 mg/dL Final   Creatinine, POC  Date Value Ref Range Status  06/10/2016 200 mg/dL Final         Passed - Valid encounter within last 12 months    Recent Outpatient Visits          5 months ago Type 2 diabetes mellitus with diabetic polyneuropathy, without long-term current use of insulin (HCC)   [facility] [place], Gavin Pound B, MD   10 months ago Type 2 diabetes mellitus with diabetic polyneuropathy, without long-term current use of insulin (HCC)   [facility] Marcine Matar, MD   1 year ago Need for influenza vaccination   [facility] Drucilla Chalet, RPH-CPP   1 year ago Type 2 diabetes mellitus with diabetic polyneuropathy, without long-term current use of insulin [facility])   [place] [facility] Marcine Matar, MD   1 year ago Coarse tremors   [facility] Marcine Matar, MD      Future Appointments            In 1 week [name], Guy Sandifer, MD [facility] [place]   In 1  month Marcine Matar, MD [facility]           . nicotine (NICODERM CQ - DOSED IN MG/24 HOURS) 14 mg/24hr patch [Pharmacy Med Name: NICOTINE 14 MG/24HR TRANSDERMAL PATCH 24 HOUR]      Sig: PLACE 1 PATCH ONTO THE SKIN DAILY     Psychiatry:  Drug Dependence Therapy Passed - 12/02/2020  6:36 PM      Passed - Valid encounter within last 12 months    Recent Outpatient Visits          5 months ago Type 2 diabetes mellitus with diabetic polyneuropathy, without long-term current use of insulin (HCC)   [facility] [place], Gavin Pound B, MD   10 months ago Type 2 diabetes mellitus with diabetic polyneuropathy, without long-term current use of insulin (HCC)   [facility] Marcine Matar, MD   1 year ago Need for influenza vaccination   [facility] [place], Cornelius Moras, RPH-CPP   1 year ago Type 2 diabetes mellitus with diabetic polyneuropathy, without long-term current use of insulin (HCC)    [facility] Marcine [facility]ar, MD   1 year ago Coarse tremors   [facility] Marcine Matar, MD      Future Appointments            In 1 week [name], Guy Sandifer, MD [facility]   In 1 month Marcine Matar, MD [facility]           . hydrOXYzine (ATARAX/VISTARIL) 10 MG tablet [Pharmacy Med Name: HYDROXYZINE HCL 10 MG ORAL TABLET] 120 tablet 2    Sig: TAKE 1 TABLET IN THE MORNING AND NOON AND 2 TABLETS IN THE EVENING AS NEEDED.     Ear, Nose, and Throat:  Antihistamines Passed - 12/02/2020  6:36 PM      Passed - Valid encounter within last 12 months    Recent Outpatient Visits          5 months ago Type 2 diabetes mellitus with diabetic polyneuropathy, without long-term current use of insulin (HCC)   [facility] [place], Gavin Pound B, MD    10 months ago Type 2 diabetes mellitus with diabetic polyneuropathy, without long-term current use of insulin (HCC)   [facility] Marcine Matar, MD   1 year ago Need for influenza vaccination   [facility] Drucilla Chalet, RPH-CPP   1 year ago Type 2 diabetes mellitus with diabetic polyneuropathy, without long-term current use of insulin [facility])   [facility] Marcine Matar, MD   1 year ago Coarse tremors   [facility] Marcine Matar, MD      Future Appointments            In 1 week [name], Guy Sandifer, MD [facility]   In 1 month Marcine Matar, MD [facility]

## 2020-12-02 NOTE — Telephone Encounter (Signed)
Requested medications are due for refill today.  yes  Requested medications are on the active medications list.  yes  Last refill. 09/28/2020  Future visit scheduled.   yes  Notes to clinic.  Number of patches and refills not specified.

## 2020-12-04 DIAGNOSIS — F331 Major depressive disorder, recurrent, moderate: Secondary | ICD-10-CM | POA: Diagnosis not present

## 2020-12-09 ENCOUNTER — Ambulatory Visit: Payer: Medicaid Other | Admitting: Physical Therapy

## 2020-12-11 DIAGNOSIS — F331 Major depressive disorder, recurrent, moderate: Secondary | ICD-10-CM | POA: Diagnosis not present

## 2020-12-12 ENCOUNTER — Encounter: Payer: Self-pay | Admitting: Specialist

## 2020-12-12 ENCOUNTER — Other Ambulatory Visit: Payer: Self-pay

## 2020-12-12 ENCOUNTER — Ambulatory Visit (INDEPENDENT_AMBULATORY_CARE_PROVIDER_SITE_OTHER): Payer: Medicaid Other | Admitting: Specialist

## 2020-12-12 VITALS — BP 117/69 | HR 84 | Ht 66.0 in | Wt 167.0 lb

## 2020-12-12 DIAGNOSIS — M48062 Spinal stenosis, lumbar region with neurogenic claudication: Secondary | ICD-10-CM | POA: Diagnosis not present

## 2020-12-12 DIAGNOSIS — M4726 Other spondylosis with radiculopathy, lumbar region: Secondary | ICD-10-CM

## 2020-12-12 DIAGNOSIS — M5417 Radiculopathy, lumbosacral region: Secondary | ICD-10-CM

## 2020-12-12 DIAGNOSIS — M5136 Other intervertebral disc degeneration, lumbar region: Secondary | ICD-10-CM | POA: Diagnosis not present

## 2020-12-12 DIAGNOSIS — M4316 Spondylolisthesis, lumbar region: Secondary | ICD-10-CM

## 2020-12-12 NOTE — Patient Instructions (Signed)
Plan: Avoid bending, stooping and avoid lifting weights greater than 10 lbs. Avoid prolong standing and walking. Order for a new walker with wheels. Surgery scheduling secretary Tivis Ringer, will call you in the next week to schedule for surgery.  Surgery recommended is a two level lumbar fusion L3-4 and L4-5  this would be done with rods, screws and cages with local bone graft and allograft (donor bone graft). Decompression at L2-3 and L5-S1 for narrowing of the opening on both sides would also be carried out.  Take hydrocodone for for pain. Risk of surgery includes risk of infection 1 in 200 patients, bleeding 1/2% chance you would need a transfusion.   Risk to the nerves is one in 10,000. You will need to use a brace for 3 months and wean from the brace on the 4th month. Expect improved walking and standing tolerance. Expect relief of leg pain but numbness may persist depending on the length and degree of pressure that has been present.  I recommend that left sided selective nerve blocks be done at L3 and L4 to determine if limiting the fusion to  The 2 levels above is enough to relieve your pain.

## 2020-12-12 NOTE — Progress Notes (Signed)
Office Visit Note   Patient: Stacy [name]           Date of Birth: [birth year]           MRN: [id number] Visit Date: 12/12/2020              Requested by: Stacy Pier, MD [address] [place],  [place] [id number] PCP: Stacy Pier, MD   Assessment & Plan: Visit Diagnoses:  1. Other spondylosis with radiculopathy, lumbar region   2. Lumbosacral radiculopathy at L3   3. Spondylolisthesis, lumbar region   4. Spinal stenosis, lumbar region, with neurogenic claudication   5. Degenerative lumbar disc     Plan: Avoid bending, stooping and avoid lifting weights greater than 10 lbs. Avoid prolong standing and walking. Order for a new walker with wheels. Surgery scheduling secretary Stacy [name], will call you in the next week to schedule for surgery.  Surgery recommended is a two level lumbar fusion L3-4 and L4-5  this would be done with rods, screws and cages with local bone graft and allograft (donor bone graft). Decompression at L2-3 and L5-S1 for narrowing of the opening on both sides would also be carried out.  Take hydrocodone for for pain. Risk of surgery includes risk of infection 1 in 200 patients, bleeding 1/2% chance you would need a transfusion.   Risk to the nerves is one in 10,000. You will need to use a brace for 3 months and wean from the brace on the 4th month. Expect improved walking and standing tolerance. Expect relief of leg pain but numbness may persist depending on the length and degree of pressure that has been present.  I recommend that left sided selective nerve blocks be done at L3 and L4 to determine if limiting the fusion to  The 2 levels above is enough to relieve your pain.   Follow-Up Instructions: No follow-ups on file.   Orders:  Orders Placed This Encounter  Procedures   Ambulatory referral to Physical Medicine Rehab   No orders of the defined types were placed in this encounter.     Procedures: No procedures  performed   Clinical Data: No additional findings.   Subjective: Chief Complaint  Patient presents with   Lower Back - Follow-up    Says the pain is killing her, MRI Review   Left Knee - Pain    Giving out more    60 year old with history multiple level lumbar bilateral partial hemilaminectomies for lateral recess stenosis, right L5-S1 microdiscectomy and left L3-4 microdiscecomies for disc protrusions now with increasing left knee pain and weakness into the left knee with stairs, standing and walking. Results of MRI are available. She can not walk far before she has to sit. She has a walker with wheels and a seat and will stop and sit down on the walker. No bowel or badder symptoms. She is limited to standing 5-10 min then has to sit or stoop or lean over. Pain is burning electrical pain left anterior knee.  Walking is limited to about 100-150 feet.    Review of Systems  Constitutional: Negative.   HENT: Negative.    Eyes: Negative.   Respiratory: Negative.    Cardiovascular: Negative.   Gastrointestinal: Negative.   Endocrine: Negative.   Genitourinary: Negative.   Musculoskeletal: Negative.   Skin: Negative.   Allergic/Immunologic: Negative.   Neurological: Negative.   Hematological: Negative.   Psychiatric/Behavioral: Negative.      Objective: Vital  Signs: BP 117/69 (BP Location: Left Arm, Patient Position: Sitting)   Pulse 84   Ht 5\' 6"  (1.676 m)   Wt 167 lb (75.8 kg)   LMP 09/01/2010   BMI 26.95 kg/m   Physical Exam Constitutional:      Appearance: She is well-developed.  HENT:     Head: Normocephalic and atraumatic.  Eyes:     Pupils: Pupils are equal, round, and reactive to light.  Pulmonary:     Effort: Pulmonary effort is normal.     Breath sounds: Normal breath sounds.  Abdominal:     General: Bowel sounds are normal.     Palpations: Abdomen is soft.  Musculoskeletal:        General: Normal range of motion.     Cervical back: Normal range of  motion and neck supple.  Skin:    General: Skin is warm and dry.  Neurological:     Mental Status: She is alert and oriented to person, place, and time.  Psychiatric:        Behavior: Behavior normal.        Thought Content: Thought content normal.        Judgment: Judgment normal.    Back Exam   Muscle Strength  Right Quadriceps:  5/5  Left Quadriceps:  5/5  Right Hamstrings:  5/5  Left Hamstrings:  5/5   Reflexes  Patellar:  0/4 Achilles:  0/4 Babinski's sign: abnormal      Specialty Comments:  No specialty comments available.  Imaging: No results found.   PMFS History: Patient Active Problem List   Diagnosis Date Noted   Herniation of lumbar intervertebral disc with radiculopathy 10/02/2016    Priority: High    Class: Chronic   Chondromalacia of both patellae 06/03/2015    Priority: High    Class: Chronic   Colon polyps 06/06/2020   Lupus (HCC) 06/06/2020   Allergic rhinitis due to animal (cat) (dog) hair and dander 04/08/2020   Allergic rhinitis due to pollen 04/08/2020   Food allergy 04/08/2020   Acute medial meniscus tear, left, subsequent encounter 03/21/2020   Chronic pain of left knee 02/15/2020   Paresthesia of skin 11/16/2019   History of total knee replacement, right 11/16/2019   Tobacco abuse 11/16/2019   Centrilobular emphysema (HCC) 08/10/2019   Incidental lung nodule, > 73mm and < 98mm 08/10/2019   OSA on CPAP 08/10/2019   Dyspnea on exertion 08/02/2019   Hyperlipidemia 08/02/2019   Lumbar radiculopathy 04/24/2019   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   Post laminectomy syndrome 02/23/2019   Abnormality of gait 02/23/2019   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   Unilateral primary osteoarthritis, left hip 12/28/2018   Lesion of skin of left ear 12/26/2018   Primary osteoarthritis of left hip 12/02/2018   Iron deficiency anemia 10/16/2018   Chronic pain syndrome 09/08/2018   Chronic pain of right knee 08/11/2018   Status post  lumbar laminectomy 07/15/2018   Peripheral arterial disease (HCC) 04/05/2018   Moderate persistent asthma without complication 06/29/2017   Environmental and seasonal allergies 06/29/2017   Controlled type 2 diabetes mellitus with diabetic polyneuropathy, without long-term current use of insulin (HCC) 06/29/2017   Perennial allergic rhinitis 04/08/2017   Sensorineural hearing loss (SNHL), bilateral 04/08/2017   Chronic pansinusitis 03/25/2017   Eustachian tube dysfunction, bilateral 03/25/2017   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   Alopecia areata 08/19/2016   Spinal stenosis, lumbar region, with neurogenic claudication 06/03/2015   Tobacco use disorder 04/25/2015  DJD (degenerative joint disease) of knee 01/04/2015   Hemorrhoid 11/14/2014   Gout of big toe 07/19/2014   Essential hypertension 08/14/2013   Gastroesophageal reflux disease without esophagitis 08/14/2013   COPD (chronic obstructive pulmonary disease) ([place]) 04/17/2011   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   Asthma    COPD (chronic obstructive pulmonary disease) (HCC)    Depression    Diabetes mellitus    Type II   GERD (gastroesophageal reflux disease)    Pt on Protonix daily   Glaucoma    Gout    Headache(784.0)    otc meds prn   Hyperlipidemia    Hypertension    Irritable bowel syndrome 11/19/2010   Neuropathy    Pneumonia YRS AGO   Restless legs    Shortness of breath    07/14/2018- uses  4 times a day    Family History  Problem Relation Age of Onset   Hypertension Father    Cancer Father    Heart disease Mother    Asthma Son        had as a child   Heart disease Sister    Breast cancer Sister    Hypertension Brother     Past Surgical History:  Procedure Laterality Date   BACK SURGERY     CHOLECYSTECTOMY     COLONOSCOPY     ENDOMETRIAL ABLATION  10/2010   EYE SURGERY      HERNIA REPAIR     umbicial hernia   JOINT REPLACEMENT Left 03/14/2019   Dr. Ninfa Linden hip   KNEE ARTHROSCOPY Left    06/07/2017 Dr. Marlou Sa of [place] ARTHROSCOPY Left 03/21/2020   Procedure: LEFT KNEE ARTHROSCOPY WITH PARTIAL MEDIAL MENISCECTOMY;  Surgeon: Mcarthur Rossetti, MD;  Location: [facility];  Service: Orthopedics;  Laterality: Left;   KNEE CLOSED REDUCTION Right 12/06/2015   Procedure: CLOSED MANIPULATION RIGHT KNEE;  Surgeon: [name], MD;  Location: [facility];  Service: Orthopedics;  Laterality: Right;   KNEE CLOSED REDUCTION Right 01/17/2016   Procedure: CLOSED MANIPULATION RIGHT KNEE;  Surgeon: [name], MD;  Location: [facility];  Service: Orthopedics;  Laterality: Right;   KNEE JOINT MANIPULATION Right 12/06/2015   LACRIMAL TUBE INSERTION Bilateral 03/01/2019   Procedure: LACRIMAL TUBE INSERTION;  Surgeon: Gevena Cotton, MD;  Location: [facility];  Service: Ophthalmology;  Laterality: Bilateral;   LACRIMAL TUBE REMOVAL Bilateral 05/03/2019   Procedure: BILATERAL NASOLACRIMAL DUCT PROBING, IIRIGATION AND TUBE REMOVAL BOTH EYES;  Surgeon: Gevena Cotton, MD;  Location: [facility];  Service: Ophthalmology;  Laterality: Bilateral;   LUMBAR DISC SURGERY  06/03/2015   L 2  L3 L4 L5    LUMBAR LAMINECTOMY/DECOMPRESSION MICRODISCECTOMY N/A 06/03/2015   Procedure: Bilateral lateral recess decompression L2-3, L3-4, L4-5;  Surgeon: [name], MD;  Location: [facility];  Service: Orthopedics;  Laterality: N/A;   LUMBAR LAMINECTOMY/DECOMPRESSION MICRODISCECTOMY N/A 10/02/2016   Procedure: Right L5-S1 Lateral Recess Decompression  microdiscectomy;  Surgeon: [name], MD;  Location: [facility];  Service: Orthopedics;  Laterality: N/A;   LUMBAR LAMINECTOMY/DECOMPRESSION MICRODISCECTOMY N/A 07/15/2018   Procedure: LEFT L3-4 MICRODISCECTOMY;  Surgeon: [name], MD;  Location: [facility];  Service: Orthopedics;  Laterality: N/A;    svd      x 2  TEAR DUCT PROBING Bilateral 03/01/2019   Procedure: TEAR DUCT PROBING WITH IRRIGATION;  Surgeon: Gevena Cotton, MD;  Location: [facility];  Service: Ophthalmology;  Laterality: Bilateral;   TOTAL HIP ARTHROPLASTY Left 03/14/2019   Procedure: LEFT TOTAL HIP ARTHROPLASTY ANTERIOR APPROACH;  Surgeon: Mcarthur Rossetti, MD;  Location: [facility];  Service: Orthopedics;  Laterality: Left;   TOTAL KNEE ARTHROPLASTY Right 09/06/2015   Procedure: RIGHT TOTAL KNEE ARTHROPLASTY;  Surgeon: [name], MD;  Location: [facility];  Service: Orthopedics;  Laterality: Right;   TUBAL LIGATION     UPPER GASTROINTESTINAL ENDOSCOPY  04/28/11   Social History   Occupational History   Occupation: unemployed    Fish farm manager: UNEMPLOYED  Tobacco Use   Smoking status: Some Days    Packs/day: 0.25    Years: 32.00    Pack years: 8.00    Types: Cigarettes   Smokeless tobacco: Never   Tobacco comments:    DOWN TO 3 PER DAY  Vaping Use   Vaping Use: Never used  Substance and Sexual Activity   Alcohol use: No   Drug use: No   Sexual activity: Yes    Birth control/protection: Surgical, Post-menopausal    Comment: tubal ligation

## 2020-12-16 ENCOUNTER — Ambulatory Visit: Payer: Medicaid Other | Admitting: Physical Therapy

## 2020-12-18 ENCOUNTER — Ambulatory Visit (INDEPENDENT_AMBULATORY_CARE_PROVIDER_SITE_OTHER): Payer: Medicaid Other | Admitting: Neurology

## 2020-12-18 ENCOUNTER — Other Ambulatory Visit: Payer: Self-pay

## 2020-12-18 ENCOUNTER — Encounter: Payer: Self-pay | Admitting: Neurology

## 2020-12-18 VITALS — BP 127/76 | HR 107 | Ht 66.0 in | Wt 164.0 lb

## 2020-12-18 DIAGNOSIS — R292 Abnormal reflex: Secondary | ICD-10-CM | POA: Diagnosis not present

## 2020-12-18 DIAGNOSIS — R202 Paresthesia of skin: Secondary | ICD-10-CM

## 2020-12-18 DIAGNOSIS — F331 Major depressive disorder, recurrent, moderate: Secondary | ICD-10-CM | POA: Diagnosis not present

## 2020-12-18 NOTE — Progress Notes (Signed)
Follow-up Visit   Date: 12/18/20   Stacy [name] MRN: [id number] DOB: [birth year]   Interim History: Stacy [name] is a 60 y.o. left-handed female with diabetes mellitus complicated by neuropathy, hypertension, chronic pain, s/p lumbar surgery x 2, depression, and tobacco use returning to the clinic for follow-up of bilateral hand pain and paresthesisa.  The patient was accompanied to the clinic by self.   History of present illness: Starting around the end of 2021, she began having numbness and tingling of all the fingers, which is worse on the left.  She wakes up at night about 2-3 times per week with her falling asleep.  She has some weakness in the hands.  She has been using wrist braces which provides some relief.  No neck or radicular pain.  She has numbness in the feet from diabetic neuropathy.   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  She lives at home with her husband.    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 She continues to have significant pain and shooting sensation involving both hands.  She wears bilateral wrist braces which helps some. She also complains of neck discomfort and left shoulder pain. She is seeing Dr. [name] for lumbar spondylosis and will be having lumbar fusion at L3-4 and L4-5.  She has no benefit with PT.  Medications:  Current Outpatient Medications on File Prior to Visit  Medication Sig Dispense Refill   ACCU-CHEK AVIVA PLUS test strip USE 3 TIMES A DAY AS DIRECTED BY PHYSICIAN 100 each 12   Accu-Chek Softclix Lancets lancets USE AS INSTRUCTED 3 TIMES A DAY 100 each 12   albuterol (PROVENTIL) (2.5 MG/3ML) 0.083% nebulizer solution USE ONE VIAL (2.5 MG TOTAL) BY NEBULIZATION EVERY 6 (SIX) HOURS AS NEEDED FOR WHEEZING. 300 mL 0   ALLERGY RELIEF 10 MG tablet TAKE 1 TABLET (10 MG TOTAL) BY MOUTH DAILY. (AM) 30  tablet 3   allopurinol (ZYLOPRIM) 100 MG tablet TAKE 1 TABLET (100 MG TOTAL) BY MOUTH DAILY.(AM) 90 tablet 3   atorvastatin (LIPITOR) 20 MG tablet TAKE 1 TABLET (20 MG TOTAL) BY MOUTH DAILY (BEDTIME) 90 tablet 2   Blood Glucose Monitoring Suppl (ACCU-CHEK AVIVA PLUS) w/Device KIT 1 each by Does not apply route 3 (three) times daily. 1 kit 0   budesonide-formoterol (SYMBICORT) 80-4.5 MCG/ACT inhaler INHALE TWO PUFFS BY MOUTH TWICE A DAY 1 Inhaler 11   celecoxib (CELEBREX) 200 MG capsule TAKE 1 CAPSULE (200 MG TOTAL) BY MOUTH 2 (TWO) TIMES DAILY. (AM+BEDTIME) 180 capsule 3   clopidogrel (PLAVIX) 75 MG tablet TAKE 1 TABLET (75 MG TOTAL) BY MOUTH DAILY. (AM) 30 tablet 0   diclofenac Sodium (VOLTAREN) 1 % GEL APPLY 4 GRAMS TOPICALLY 4 (FOUR) TIMES DAILY. 400 g 6   dicyclomine (BENTYL) 10 MG capsule Tale one capsule 3 times daily     DULoxetine (CYMBALTA) 60 MG capsule Take 1 capsule (60 mg total) by mouth daily. 30 capsule 3   EPINEPHrine 0.3 mg/0.3 mL IJ SOAJ injection 0.3 mg IM x 1 PRN for allergic reaction (Patient taking differently: Inject 0.3 mg into the muscle once as needed for anaphylaxis.) 1 Device 1   FLOWFLEX COVID-19 AG HOME TEST KIT See admin instructions.     fluticasone (FLONASE) 50 MCG/ACT nasal spray Place 1 spray into both nostrils daily. 16 g 6   gabapentin (  NEURONTIN) 600 MG tablet TAKE ONE CAPSULE BY MOUTH THREE TIMES A DAY (AM+NOON+BEDTIME) 90 tablet 1   glimepiride (AMARYL) 2 MG tablet TAKE 1 TABLET (2 MG TOTAL) BY MOUTH DAILY. (AM) 90 tablet 1   HYDROcodone-acetaminophen (NORCO/VICODIN) 5-325 MG tablet Take 1-2 tablets by mouth every 6 (six) hours as needed for moderate pain. 30 tablet 0   hydrOXYzine (ATARAX/VISTARIL) 10 MG tablet TAKE 1 TABLET IN THE MORNING AND NOON AND 2 TABLETS IN THE EVENING AS NEEDED. 120 tablet 2   Lancets (ACCU-CHEK SOFT TOUCH) lancets Use as instructed 100 each 12   loperamide (IMODIUM) 2 MG capsule TAKE 1 CAPSULE (2 MG TOTAL) BY MOUTH 4 (FOUR) TIMES  DAILY AS NEEDED FOR DIARRHEA OR LOOSE STOOLS. 15 capsule 0   loratadine (CLARITIN) 10 MG tablet Take 10 mg by mouth daily.     meloxicam (MOBIC) 15 MG tablet TAKE 1 TABLET (15 MG TOTAL) BY MOUTH DAILY. (AM) 30 tablet 0   metFORMIN (GLUCOPHAGE-XR) 750 MG 24 hr tablet TAKE 1 TABLET (750 MG TOTAL) BY MOUTH 2 (TWO) TIMES DAILY. 60 tablet 3   methocarbamol (ROBAXIN) 750 MG tablet TAKE 1 TABLET (750 MG TOTAL) BY MOUTH EVERY 12 (TWELVE) HOURS AS NEEDED FOR MUSCLE SPASMS. 40 tablet 1   metoCLOPramide (REGLAN) 5 MG tablet Take 5 mg by mouth 3 (three) times daily as needed. Take 1 tablet by mouth three times a day before meals as needed for nausea and vomiting.     montelukast (SINGULAIR) 10 MG tablet Take 10 mg by mouth at bedtime.      Multiple Vitamins-Minerals (CENTRUM SILVER 50+WOMEN PO) Take by mouth.     nicotine (NICODERM CQ - DOSED IN MG/24 HOURS) 14 mg/24hr patch PLACE 1 PATCH ONTO THE SKIN DAILY 28 patch 1   pantoprazole (PROTONIX) 40 MG tablet Take 1 tablet (40 mg total) by mouth daily. 30 tablet 2   polyethylene glycol (MIRALAX / GLYCOLAX) 17 g packet      potassium chloride SA (KLOR-CON) 20 MEQ tablet TAKE ONE TABLET BY MOUTH ONCE DAILY (NOON) 90 tablet 1   Respiratory Therapy Supplies (FLUTTER) DEVI Use after breathing treatment 4 times daily 1 each 0   SPIRIVA HANDIHALER 18 MCG inhalation capsule PLACE 1 CAPSULE (18 MCG TOTAL) INTO INHALER AND INHALE DAILY. 30 capsule 2   sucralfate (CARAFATE) 1 g tablet Take 1 g by mouth 4 (four) times daily -  with meals and at bedtime.     triamcinolone cream (KENALOG) 0.1 % Apply 1 application topically 2 (two) times daily. 30 g 0   valsartan-hydrochlorothiazide (DIOVAN-HCT) 160-12.5 MG tablet TAKE ONE TABLET BY MOUTH ONCE DAILY (AM) 30 tablet 0   [DISCONTINUED] Fluticasone-Salmeterol (ADVAIR) 500-50 MCG/DOSE AEPB Inhale 1 puff into the lungs every 12 (twelve) hours.       [DISCONTINUED] lisinopril (PRINIVIL,ZESTRIL) 40 MG tablet Take 40 mg by mouth  daily.       No current facility-administered medications on file prior to visit.    Allergies:  Allergies  Allergen Reactions   Other Shortness Of Breath    UNSPECIFIED AGENTS Allergic to perfumes and cleaning products   Shellfish Allergy Anaphylaxis    Per allergy test.   Ace Inhibitors Cough and Other (See Comments)        Celecoxib     Other reaction(s): upset stomach   Shellfish-Derived Products Other (See Comments)   Aspirin Nausea Only    Other reaction(s): stomach upset Other reaction(s): Unknown    Vital Signs:  BP 127/76   Pulse (!) 107   Ht 5' 6"  (1.676 m)   Wt 164 lb (74.4 kg)   LMP 09/01/2010   SpO2 99%   BMI 26.47 kg/m   Neurological Exam: MENTAL STATUS including orientation to time, place, person, recent and remote memory, attention span and concentration, language, and fund of knowledge is normal.  Speech is not dysarthric.  CRANIAL NERVES:   Pupils equal round and reactive to light.  Normal conjugate, extra-ocular eye movements in all directions of gaze.  No ptosis.  Face is symmetric.  MOTOR:  Motor strength is 5/5 in all extremities.  No atrophy, fasciculations or abnormal movements.  No pronator drift.  Tone is normal.    MSRs:  Reflexes are 3+/4 throughout and 2+/4 at the ankles.  SENSORY:  Vibration is reduced at the ankles bilaterally, normal at the ankles and MCP.Marland Kitchen  COORDINATION/GAIT:  Gait is slow, stable, assisted with rollator.   Data: NCS/EMG of the upper extremities 09/17/2020: Right median neuropathy at or distal to the wrist (mild), consistent with a clinical diagnosis of carpal tunnel syndrome.    IMPRESSION/PLAN: Bilateral hand paresthesias and pain. NCS/EMG with mild right CTS which would not manifest with the diffuse nature of her pain.  Her exam shows brisk reflexes throughout, so I recommend checking MRI cervical spine wo contrast to evaluate for canal stenosis.   Further recommendations pending results.   Thank you for  allowing me to participate in patient's care.  If I can answer any additional questions, I would be pleased to do so.    Sincerely,    Emmry Hinsch K. Posey Pronto, DO

## 2020-12-18 NOTE — Patient Instructions (Signed)
MRI cervical spine without contrast.  We will notify you of the results and determine the next step.

## 2020-12-23 ENCOUNTER — Other Ambulatory Visit: Payer: Self-pay | Admitting: Internal Medicine

## 2020-12-23 DIAGNOSIS — Z1231 Encounter for screening mammogram for malignant neoplasm of breast: Secondary | ICD-10-CM

## 2020-12-25 DIAGNOSIS — F331 Major depressive disorder, recurrent, moderate: Secondary | ICD-10-CM | POA: Diagnosis not present

## 2021-01-01 ENCOUNTER — Other Ambulatory Visit: Payer: Self-pay | Admitting: Physical Medicine & Rehabilitation

## 2021-01-01 ENCOUNTER — Other Ambulatory Visit: Payer: Self-pay | Admitting: Internal Medicine

## 2021-01-01 DIAGNOSIS — F331 Major depressive disorder, recurrent, moderate: Secondary | ICD-10-CM | POA: Diagnosis not present

## 2021-01-01 DIAGNOSIS — I739 Peripheral vascular disease, unspecified: Secondary | ICD-10-CM

## 2021-01-01 DIAGNOSIS — I10 Essential (primary) hypertension: Secondary | ICD-10-CM

## 2021-01-02 ENCOUNTER — Other Ambulatory Visit: Payer: Self-pay | Admitting: Internal Medicine

## 2021-01-02 ENCOUNTER — Other Ambulatory Visit: Payer: Self-pay | Admitting: Physical Medicine & Rehabilitation

## 2021-01-02 ENCOUNTER — Other Ambulatory Visit (INDEPENDENT_AMBULATORY_CARE_PROVIDER_SITE_OTHER): Payer: Self-pay | Admitting: Surgery

## 2021-01-02 ENCOUNTER — Telehealth: Payer: Self-pay | Admitting: Internal Medicine

## 2021-01-02 ENCOUNTER — Ambulatory Visit: Payer: Medicaid Other | Attending: Internal Medicine | Admitting: Internal Medicine

## 2021-01-02 ENCOUNTER — Other Ambulatory Visit: Payer: Self-pay

## 2021-01-02 ENCOUNTER — Encounter: Payer: Self-pay | Admitting: Internal Medicine

## 2021-01-02 ENCOUNTER — Other Ambulatory Visit (HOSPITAL_COMMUNITY)
Admission: RE | Admit: 2021-01-02 | Discharge: 2021-01-02 | Disposition: A | Discharge status: Home / Self Care | Payer: Medicaid Other | Source: Ambulatory Visit | Attending: Internal Medicine | Admitting: Internal Medicine

## 2021-01-02 VITALS — BP 111/73 | HR 75 | Ht 66.0 in | Wt 164.0 lb

## 2021-01-02 DIAGNOSIS — Z124 Encounter for screening for malignant neoplasm of cervix: Secondary | ICD-10-CM

## 2021-01-02 NOTE — Progress Notes (Signed)
Patient ID: Stacy [name], female    DOB: July 30, 1960  MRN: [id number]  CC: Gynecologic Exam   Subjective: Stacy [name] is a 60 y.o. female who presents for pap Her concerns today include:  Pt with hx of HTN, DM with neuropathy, tob dep, HL, PAD, IDA, lupus, spinal stenosis with neurogenic claudication (s/p laminectomy 07/2018), COPD,OSA on CPAP,LS, OA knees, gout, recurrent sinusitis, receiving allergy shots from Dr. Faith Rogue.  GYN History:  Pt is G2P2 Any hx of abn paps?:yes.  Last Pap was in October 2020.  She had positive HPV subtype.  Saw the gynecologist Dr. Rip Harbour and had biopsy done.  This revealed atypia consistent with LGSIL, CIN-1.  I do not see a note stating when her next Pap would have been due and patient does not recall.  However I think based on the results she is due for repeat Pap. Menses regular or irregular?: menopausal How long does menses last?  N/A Menstrual flow light or heavy?:  Method of birth control?:  NA Any vaginal dischg at this time?: yes, white for 6 mths Dysuria?: no Any hx of STI?: no Sexually active with how many partners: no sex in yrs Desires STI screen: yes Last MMG:  scheduled for this mth Family hx of uterine, cervical or breast cancer?:  sister has cancer but not sure what the type.   Patient tells me she will be having back surgery soon and her surgeon will be faxing me a form likely for preoperative clearance. Patient Active Problem List   Diagnosis Date Noted   Colon polyps 06/06/2020   Lupus ([place]) 06/06/2020   Allergic rhinitis due to animal (cat) (dog) hair and dander 04/08/2020   Allergic rhinitis due to pollen 04/08/2020   Food allergy 04/08/2020   Acute medial meniscus tear, left, subsequent encounter 03/21/2020   Chronic pain of left knee 02/15/2020   Paresthesia of skin 11/16/2019   History of total knee replacement, right 11/16/2019   Tobacco abuse 11/16/2019   Centrilobular emphysema ([place]) 08/10/2019    Incidental lung nodule, > 51m and < 833m07/09/2019   OSA on CPAP 08/10/2019   Dyspnea on exertion 08/02/2019   Hyperlipidemia 08/02/2019   Lumbar radiculopathy 04/24/2019   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   Post laminectomy syndrome 02/23/2019   Abnormality of gait 02/23/2019   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   Unilateral primary osteoarthritis, left hip 12/28/2018   Lesion of skin of left ear 12/26/2018   Primary osteoarthritis of left hip 12/02/2018   Iron deficiency anemia 10/16/2018   Chronic pain syndrome 09/08/2018   Chronic pain of right knee 08/11/2018   Status post lumbar laminectomy 07/15/2018   Peripheral arterial disease ([place]03/04/2018   Moderate persistent asthma without complication 0564/33/2951 Environmental and seasonal allergies 06/29/2017   Controlled type 2 diabetes mellitus with diabetic polyneuropathy, without long-term current use of insulin ([place]05/28/2019   Perennial allergic rhinitis 04/08/2017   Sensorineural hearing loss (SNHL), bilateral 04/08/2017   Chronic pansinusitis 03/25/2017   Eustachian tube dysfunction, bilateral 0288/41/6606 Lichen planopilaris 0930/16/0109 Herniation of lumbar intervertebral disc with radiculopathy 10/02/2016    Class: Chronic   Alopecia areata 08/19/2016   Chondromalacia of both patellae 06/03/2015    Class: Chronic   Spinal stenosis, lumbar region, with neurogenic claudication 06/03/2015   Tobacco use disorder 04/25/2015   DJD (degenerative joint disease) of knee 01/04/2015   Hemorrhoid 11/14/2014   Gout of big toe  07/19/2014   Essential hypertension 08/14/2013   Gastroesophageal reflux disease without esophagitis 08/14/2013   COPD (chronic obstructive pulmonary disease) ([place]) 04/17/2011     Current Outpatient Medications on File Prior to Visit  Medication Sig Dispense Refill   ACCU-CHEK AVIVA PLUS test strip USE 3 TIMES A DAY AS DIRECTED BY PHYSICIAN 100 each 12   Accu-Chek Softclix Lancets lancets  USE AS INSTRUCTED 3 TIMES A DAY 100 each 12   albuterol (PROVENTIL) (2.5 MG/3ML) 0.083% nebulizer solution USE ONE VIAL (2.5 MG TOTAL) BY NEBULIZATION EVERY 6 (SIX) HOURS AS NEEDED FOR WHEEZING. 300 mL 0   ALLERGY RELIEF 10 MG tablet TAKE 1 TABLET (10 MG TOTAL) BY MOUTH DAILY. (AM) 30 tablet 3   allopurinol (ZYLOPRIM) 100 MG tablet TAKE 1 TABLET (100 MG TOTAL) BY MOUTH DAILY.(AM) 90 tablet 3   atorvastatin (LIPITOR) 20 MG tablet TAKE 1 TABLET (20 MG TOTAL) BY MOUTH DAILY (BEDTIME) 90 tablet 2   Blood Glucose Monitoring Suppl (ACCU-CHEK AVIVA PLUS) w/Device KIT 1 each by Does not apply route 3 (three) times daily. 1 kit 0   budesonide-formoterol (SYMBICORT) 80-4.5 MCG/ACT inhaler INHALE TWO PUFFS BY MOUTH TWICE A DAY 1 Inhaler 11   celecoxib (CELEBREX) 200 MG capsule TAKE 1 CAPSULE (200 MG TOTAL) BY MOUTH 2 (TWO) TIMES DAILY. (AM+BEDTIME) 180 capsule 3   clopidogrel (PLAVIX) 75 MG tablet TAKE 1 TABLET (75 MG TOTAL) BY MOUTH DAILY. (AM) 30 tablet 0   diclofenac Sodium (VOLTAREN) 1 % GEL APPLY 4 GRAMS TOPICALLY 4 (FOUR) TIMES DAILY. 400 g 6   dicyclomine (BENTYL) 10 MG capsule Tale one capsule 3 times daily     DULoxetine (CYMBALTA) 60 MG capsule Take 1 capsule (60 mg total) by mouth daily. 30 capsule 3   EPINEPHrine 0.3 mg/0.3 mL IJ SOAJ injection 0.3 mg IM x 1 PRN for allergic reaction (Patient taking differently: Inject 0.3 mg into the muscle once as needed for anaphylaxis.) 1 Device 1   FLOWFLEX COVID-19 AG HOME TEST KIT See admin instructions.     fluticasone (FLONASE) 50 MCG/ACT nasal spray Place 1 spray into both nostrils daily. 16 g 6   gabapentin (NEURONTIN) 600 MG tablet TAKE ONE CAPSULE BY MOUTH THREE TIMES A DAY (AM+NOON+BEDTIME) 90 tablet 1   glimepiride (AMARYL) 2 MG tablet TAKE 1 TABLET (2 MG TOTAL) BY MOUTH DAILY. (AM) 90 tablet 1   HYDROcodone-acetaminophen (NORCO/VICODIN) 5-325 MG tablet Take 1-2 tablets by mouth every 6 (six) hours as needed for moderate pain. 30 tablet 0    hydrOXYzine (ATARAX/VISTARIL) 10 MG tablet TAKE 1 TABLET IN THE MORNING AND NOON AND 2 TABLETS IN THE EVENING AS NEEDED. 120 tablet 2   Lancets (ACCU-CHEK SOFT TOUCH) lancets Use as instructed 100 each 12   loperamide (IMODIUM) 2 MG capsule TAKE 1 CAPSULE (2 MG TOTAL) BY MOUTH 4 (FOUR) TIMES DAILY AS NEEDED FOR DIARRHEA OR LOOSE STOOLS. 15 capsule 0   loratadine (CLARITIN) 10 MG tablet Take 10 mg by mouth daily.     metFORMIN (GLUCOPHAGE-XR) 750 MG 24 hr tablet TAKE 1 TABLET (750 MG TOTAL) BY MOUTH 2 (TWO) TIMES DAILY. 60 tablet 3   methocarbamol (ROBAXIN) 750 MG tablet TAKE 1 TABLET (750 MG TOTAL) BY MOUTH EVERY 12 (TWELVE) HOURS AS NEEDED FOR MUSCLE SPASMS. 40 tablet 1   metoCLOPramide (REGLAN) 5 MG tablet Take 5 mg by mouth 3 (three) times daily as needed. Take 1 tablet by mouth three times a day before meals as needed for nausea  and vomiting.     montelukast (SINGULAIR) 10 MG tablet Take 10 mg by mouth at bedtime.      Multiple Vitamins-Minerals (CENTRUM SILVER 50+WOMEN PO) Take by mouth.     nicotine (NICODERM CQ - DOSED IN MG/24 HOURS) 14 mg/24hr patch PLACE 1 PATCH ONTO THE SKIN DAILY 28 patch 1   pantoprazole (PROTONIX) 40 MG tablet Take 1 tablet (40 mg total) by mouth daily. 30 tablet 2   potassium chloride SA (KLOR-CON) 20 MEQ tablet TAKE ONE TABLET BY MOUTH ONCE DAILY (NOON) 90 tablet 1   Respiratory Therapy Supplies (FLUTTER) DEVI Use after breathing treatment 4 times daily 1 each 0   SPIRIVA HANDIHALER 18 MCG inhalation capsule PLACE 1 CAPSULE (18 MCG TOTAL) INTO INHALER AND INHALE DAILY. 30 capsule 2   sucralfate (CARAFATE) 1 g tablet Take 1 g by mouth 4 (four) times daily -  with meals and at bedtime.     triamcinolone cream (KENALOG) 0.1 % Apply 1 application topically 2 (two) times daily. 30 g 0   valsartan-hydrochlorothiazide (DIOVAN-HCT) 160-12.5 MG tablet TAKE ONE TABLET BY MOUTH ONCE DAILY (AM) 30 tablet 0   polyethylene glycol (MIRALAX / GLYCOLAX) 17 g packet  (Patient not  taking: Reported on 01/02/2021)     [DISCONTINUED] Fluticasone-Salmeterol (ADVAIR) 500-50 MCG/DOSE AEPB Inhale 1 puff into the lungs every 12 (twelve) hours.       [DISCONTINUED] lisinopril (PRINIVIL,ZESTRIL) 40 MG tablet Take 40 mg by mouth daily.       No current facility-administered medications on file prior to visit.    Allergies  Allergen Reactions   Other Shortness Of Breath    UNSPECIFIED AGENTS Allergic to perfumes and cleaning products   Shellfish Allergy Anaphylaxis    Per allergy test.   Ace Inhibitors Cough and Other (See Comments)        Celecoxib     Other reaction(s): upset stomach   Shellfish-Derived Products Other (See Comments)   Aspirin Nausea Only    Other reaction(s): stomach upset Other reaction(s): Unknown    Social History   Socioeconomic History   Marital status: Married    Spouse name: Not on file   Number of children: 2   Years of education: Not on file   Highest education level: Not on file  Occupational History   Occupation: unemployed    Employer: UNEMPLOYED  Tobacco Use   Smoking status: Some Days    Packs/day: 0.25    Years: 32.00    Pack years: 8.00    Types: Cigarettes   Smokeless tobacco: Never   Tobacco comments:    DOWN TO 3 PER DAY  Vaping Use   Vaping Use: Never used  Substance and Sexual Activity   Alcohol use: No   Drug use: No   Sexual activity: Yes    Birth control/protection: Surgical, Post-menopausal    Comment: tubal ligation  Other Topics Concern   Not on file  Social History Narrative   Left Handed    Lives in a two story apartment.    Drinks Caffeine    Social Determinants of Radio broadcast assistant Strain: Not on file  Food Insecurity: Not on file  Transportation Needs: Not on file  Physical Activity: Not on file  Stress: Not on file  Social Connections: Not on file  Intimate Partner Violence: Not on file    Family History  Problem Relation Age of Onset   Hypertension Father    Cancer  Father  Heart disease Mother    Asthma Son        had as a child   Heart disease Sister    Breast cancer Sister    Hypertension Brother     Past Surgical History:  Procedure Laterality Date   BACK SURGERY     CHOLECYSTECTOMY     COLONOSCOPY     ENDOMETRIAL ABLATION  10/2010   EYE SURGERY     HERNIA REPAIR     umbicial hernia   JOINT REPLACEMENT Left 03/14/2019   Dr. Ninfa Linden hip   KNEE ARTHROSCOPY Left    06/07/2017 Dr. Marlou Sa of [place] ARTHROSCOPY Left 03/21/2020   Procedure: LEFT KNEE ARTHROSCOPY WITH PARTIAL MEDIAL MENISCECTOMY;  Surgeon: Mcarthur Rossetti, MD;  Location: [place];  Service: Orthopedics;  Laterality: Left;   KNEE CLOSED REDUCTION Right 12/06/2015   Procedure: CLOSED MANIPULATION RIGHT KNEE;  Surgeon: [name], MD;  Location: [place];  Service: Orthopedics;  Laterality: Right;   KNEE CLOSED REDUCTION Right 01/17/2016   Procedure: CLOSED MANIPULATION RIGHT KNEE;  Surgeon: [name], MD;  Location: [place];  Service: Orthopedics;  Laterality: Right;   KNEE JOINT MANIPULATION Right 12/06/2015   LACRIMAL TUBE INSERTION Bilateral 03/01/2019   Procedure: LACRIMAL TUBE INSERTION;  Surgeon: Gevena Cotton, MD;  Location: [facility];  Service: Ophthalmology;  Laterality: Bilateral;   LACRIMAL TUBE REMOVAL Bilateral 05/03/2019   Procedure: BILATERAL NASOLACRIMAL DUCT PROBING, IIRIGATION AND TUBE REMOVAL BOTH EYES;  Surgeon: Gevena Cotton, MD;  Location: [place];  Service: Ophthalmology;  Laterality: Bilateral;   LUMBAR DISC SURGERY  06/03/2015   L 2  L3 L4 L5    LUMBAR LAMINECTOMY/DECOMPRESSION MICRODISCECTOMY N/A 06/03/2015   Procedure: Bilateral lateral recess decompression L2-3, L3-4, L4-5;  Surgeon: [name], MD;  Location: [place];  Service: Orthopedics;  Laterality: N/A;   LUMBAR LAMINECTOMY/DECOMPRESSION MICRODISCECTOMY N/A 10/02/2016   Procedure: Right L5-S1 Lateral Recess Decompression   microdiscectomy;  Surgeon: [name], MD;  Location: [place];  Service: Orthopedics;  Laterality: N/A;   LUMBAR LAMINECTOMY/DECOMPRESSION MICRODISCECTOMY N/A 07/15/2018   Procedure: LEFT L3-4 MICRODISCECTOMY;  Surgeon: [name], MD;  Location: [place];  Service: Orthopedics;  Laterality: N/A;   svd      x 2   TEAR DUCT PROBING Bilateral 03/01/2019   Procedure: TEAR DUCT PROBING WITH IRRIGATION;  Surgeon: Gevena Cotton, MD;  Location: [facility];  Service: Ophthalmology;  Laterality: Bilateral;   TOTAL HIP ARTHROPLASTY Left 03/14/2019   Procedure: LEFT TOTAL HIP ARTHROPLASTY ANTERIOR APPROACH;  Surgeon: Mcarthur Rossetti, MD;  Location: [place];  Service: Orthopedics;  Laterality: Left;   TOTAL KNEE ARTHROPLASTY Right 09/06/2015   Procedure: RIGHT TOTAL KNEE ARTHROPLASTY;  Surgeon: [name], MD;  Location: [place];  Service: Orthopedics;  Laterality: Right;   TUBAL LIGATION     UPPER GASTROINTESTINAL ENDOSCOPY  04/28/11    ROS: Review of Systems Negative except as stated above  PHYSICAL EXAM: BP 111/73   Pulse 75   Ht _0  (1.676 m)   Wt 164 lb (74.4 kg)   LMP 09/01/2010   SpO2 98%   BMI 26.47 kg/m   Physical Exam  General appearance - alert, well appearing, and in no distress.  Patient in wheelchair. Mental status - normal mood, behavior, speech, dress, motor activity, and thought processes Breasts - [employer] student [place] present for breast and pelvic exam as chaperone: Breasts  appear normal, no suspicious masses, no skin or nipple changes or axillary nodes Pelvic - normal external genitalia, vulva, vagina, cervix, uterus and adnexa   CMP Latest Ref Rng & Units 03/18/2020 08/02/2019 05/03/2019  Glucose 70 - 99 mg/dL 169(H) - 125(H)  BUN 6 - 20 mg/dL 7 - 11  Creatinine 0.44 - 1.00 mg/dL 0.74 - 0.72  Sodium 135 - 145 mmol/L 138 - 137  Potassium 3.5 - 5.1 mmol/L 3.7 - 3.5  Chloride 98 - 111 mmol/L 100 - 103  CO2 22 - 32 mmol/L 26 - 25  Calcium 8.9 - 10.3  mg/dL 9.1 - 9.2  Total Protein 6.0 - 8.5 g/dL - 7.3 -  Total Bilirubin 0.0 - 1.2 mg/dL - 0.2 -  Alkaline Phos 48 - 121 IU/L - 129(H) -  AST 0 - 40 IU/L - 17 -  ALT 0 - 32 IU/L - 12 -   Lipid Panel     Component Value Date/Time   CHOL 160 01/17/2020 0817   TRIG 191 (H) 01/17/2020 0817   HDL 43 01/17/2020 0817   CHOLHDL 3.7 01/17/2020 0817   CHOLHDL 4.2 07/19/2014 1202   VLDL 24 07/19/2014 1202   LDLCALC 85 01/17/2020 0817    CBC    Component Value Date/Time   WBC 7.1 01/15/2020 1133   WBC 8.0 03/27/2019 1441   RBC 4.36 01/15/2020 1133   RBC 3.79 (L) 03/27/2019 1441   HGB 12.2 01/15/2020 1133   HCT 36.4 01/15/2020 1133   PLT 295 01/15/2020 1133   MCV 84 01/15/2020 1133   MCH 28.0 01/15/2020 1133   MCH 27.7 03/27/2019 1441   MCHC 33.5 01/15/2020 1133   MCHC 31.8 03/27/2019 1441   RDW 13.5 01/15/2020 1133   LYMPHSABS 4.1 (H) 10/14/2018 1704   MONOABS 0.5 03/18/2018 0252   EOSABS 0.1 10/14/2018 1704   BASOSABS 0.0 10/14/2018 1704    ASSESSMENT AND PLAN: 1. Pap smear for cervical cancer screening - Cervicovaginal ancillary only - Cytology - PAP  Patient will follow-up with me in 3 weeks for chronic disease management and preoperative evaluation. Patient was given the opportunity to ask questions.  Patient verbalized understanding of the plan and was able to repeat key elements of the plan.   No orders of the defined types were placed in this encounter.    Requested Prescriptions    No prescriptions requested or ordered in this encounter    Return in about 3 weeks (around 01/23/2021) for Chronic ds management and pre-op eval for back surgery.  Karle Plumber, MD, FACP

## 2021-01-02 NOTE — Telephone Encounter (Signed)
Medication Refill - Medication:meloxicam (MOBIC) 15 MG tablet opidogrel (PLAVIX) 75 MG tablet /valsartan-hydrochlorothiazide (DIOVAN-HCT) 160-12.5 MG tablet and  duloxtine 60mg   Has the patient contacted their pharmacy? yes (Agent: If no, request that the patient contact the pharmacy for the refill. If patient does not wish to contact the pharmacy document the reason why and proceed with request.) (Agent: If yes, when and what did the pharmacy advise?)pharmacy called in directly  Preferred Pharmacy (with phone number or street name): [facility] - [place], [place] - [address]  Phone:  [phone] Fax:  [phone]  Has the patient been seen for an appointment in the last year OR does the patient have an upcoming appointment? yes  Agent: Please be advised that RX refills may take up to 3 business days. We ask that you follow-up with your pharmacy.

## 2021-01-03 NOTE — Telephone Encounter (Signed)
Requested medication (s) are due for refill today: Spiriva just filled 01/01/21  Requested medication (s) are on the active medication list: yes  Last refill:  01/01/21 for Spiriva  Future visit scheduled: no  Notes to clinic:  Failed protocol of labs within 180 and 360 days, no upcoming appt, OV note from this week states she is returning in 3 weeks, no scheduled appt, please assess.       Requested Prescriptions  Pending Prescriptions Disp Refills   valsartan-hydrochlorothiazide (DIOVAN-HCT) 160-12.5 MG tablet [Pharmacy Med Name: VALSARTAN-HYDROCHLOROTHIAZIDE 160-12.5 MG ORAL TABLET] 30 tablet 0    Sig: TAKE ONE TABLET BY MOUTH ONCE DAILY (AM)     Cardiovascular: ARB + Diuretic Combos Failed - 01/01/2021  5:51 PM      Failed - K in normal range and within 180 days    Potassium  Date Value Ref Range Status  03/18/2020 3.7 3.5 - 5.1 mmol/L Final          Failed - Na in normal range and within 180 days    Sodium  Date Value Ref Range Status  03/18/2020 138 135 - 145 mmol/L Final  03/02/2019 141 134 - 144 mmol/L Final          Failed - Cr in normal range and within 180 days    Creat  Date Value Ref Range Status  03/11/2016 0.90 0.50 - 1.05 mg/dL Final    Comment:      For patients > or = 61 years of age: The upper reference limit for Creatinine is approximately 13% higher for people identified as African-American.      Creatinine, Ser  Date Value Ref Range Status  03/18/2020 0.74 0.44 - 1.00 mg/dL Final   Creatinine, POC  Date Value Ref Range Status  06/10/2016 200 mg/dL Final          Failed - Ca in normal range and within 180 days    Calcium  Date Value Ref Range Status  03/18/2020 9.1 8.9 - 10.3 mg/dL Final   Calcium, Ion  Date Value Ref Range Status  03/01/2019 1.22 1.15 - 1.40 mmol/L Final          Failed - Valid encounter within last 6 months    Recent Outpatient Visits           Yesterday Pap smear for cervical cancer screening   [place], Neoma Laming B, MD   7 months ago Type 2 diabetes mellitus with diabetic polyneuropathy, without long-term current use of insulin ([place])   [place] Karle Plumber B, MD   11 months ago Type 2 diabetes mellitus with diabetic polyneuropathy, without long-term current use of insulin ([place])   [place] [name], MD   1 year ago Need for influenza vaccination   [place], Stephen L, RPH-CPP   1 year ago Type 2 diabetes mellitus with diabetic polyneuropathy, without long-term current use of insulin [facility])   [place], MD       Future Appointments             In 6 days [name], MD [place] - Patient is not pregnant      Passed - Last BP in normal range    BP Readings from  Last 1 Encounters:  01/02/21 111/73           clopidogrel (PLAVIX) 75 MG tablet [Pharmacy Med Name: CLOPIDOGREL BISULFATE 75 MG ORAL TABLET] 30 tablet 0    Sig: TAKE 1 TABLET (75 MG TOTAL) BY MOUTH DAILY. (AM)     Hematology: Antiplatelets - clopidogrel Failed - 01/01/2021  5:51 PM      Failed - Evaluate AST, ALT within 2 months of therapy initiation.      Failed - ALT in normal range and within 360 days    ALT  Date Value Ref Range Status  08/02/2019 12 0 - 32 IU/L Final          Failed - AST in normal range and within 360 days    AST  Date Value Ref Range Status  08/02/2019 17 0 - 40 IU/L Final          Failed - HCT in normal range and within 180 days    Hematocrit  Date Value Ref Range Status  01/15/2020 36.4 34.0 - 46.6 % Final          Failed - HGB in normal range and within 180 days    Hemoglobin  Date Value Ref Range Status  01/15/2020 12.2 11.1 - 15.9 g/dL Final          Failed - PLT in normal range and within 180 days    Platelets   Date Value Ref Range Status  01/15/2020 295 150 - 450 x10E3/uL Final          Failed - Valid encounter within last 6 months    Recent Outpatient Visits           Yesterday Pap smear for cervical cancer screening   [place], Neoma Laming B, MD   7 months ago Type 2 diabetes mellitus with diabetic polyneuropathy, without long-term current use of insulin ([place])   [place] Karle Plumber B, MD   11 months ago Type 2 diabetes mellitus with diabetic polyneuropathy, without long-term current use of insulin ([place])   [place], Deborah B, MD   1 year ago Need for influenza vaccination   [place], Jarome Matin, RPH-CPP   1 year ago Type 2 diabetes mellitus with diabetic polyneuropathy, without long-term current use of insulin ([place])   [place], MD       Future Appointments             In 6 days [name], Daleen Bo, MD [place] 18 MCG inhalation capsule [Pharmacy Med Name: SPIRIVA HANDIHALER 18 MCG INHALATION CAPSULE] 30 capsule 2    Sig: PLACE 1 CAPSULE (18 MCG TOTAL) INTO INHALER AND INHALE DAILY.     Pulmonology:  Anticholinergic Agents Passed - 01/01/2021  5:51 PM      Passed - Valid encounter within last 12 months    Recent Outpatient Visits           Yesterday Pap smear for cervical cancer screening   [place] Karle Plumber B, MD   7 months ago Type 2 diabetes mellitus with diabetic polyneuropathy, without long-term current use of insulin [facility])   [place] [name], MD   11 months ago  Type 2 diabetes mellitus with diabetic polyneuropathy, without long-term current use of insulin [facility])   [place] [facility] Marcine Matar, MD   1  year ago Need for influenza vaccination   [facility] Drucilla Chalet, RPH-CPP   1 year ago Type 2 diabetes mellitus with diabetic polyneuropathy, without long-term current use of insulin [facility])   [place] [facility] Marcine Matar, MD       Future Appointments             In 6 days Kerrin Champagne, MD [facility]

## 2021-01-06 ENCOUNTER — Other Ambulatory Visit: Payer: Self-pay | Admitting: Internal Medicine

## 2021-01-06 LAB — CERVICOVAGINAL ANCILLARY ONLY
Bacterial Vaginitis (gardnerella): POSITIVE — AB
Candida Glabrata: NEGATIVE
Candida Vaginitis: POSITIVE — AB
Chlamydia: NEGATIVE
Comment: NEGATIVE
Comment: NEGATIVE
Comment: NEGATIVE
Comment: NEGATIVE
Comment: NEGATIVE
Comment: NORMAL
Neisseria Gonorrhea: NEGATIVE
Trichomonas: NEGATIVE

## 2021-01-06 MED ORDER — FLUCONAZOLE 150 MG PO TABS: 150.0000 mg | ORAL_TABLET | Freq: Once | ORAL | 0 refills | Status: AC

## 2021-01-06 MED ORDER — METRONIDAZOLE 500 MG PO TABS: 500.0000 mg | ORAL_TABLET | Freq: Two times a day (BID) | ORAL | 0 refills | Status: DC

## 2021-01-08 DIAGNOSIS — F331 Major depressive disorder, recurrent, moderate: Secondary | ICD-10-CM | POA: Diagnosis not present

## 2021-01-09 ENCOUNTER — Other Ambulatory Visit: Payer: Self-pay | Admitting: Internal Medicine

## 2021-01-09 ENCOUNTER — Ambulatory Visit (INDEPENDENT_AMBULATORY_CARE_PROVIDER_SITE_OTHER): Payer: Medicaid Other | Admitting: Specialist

## 2021-01-09 ENCOUNTER — Encounter: Payer: Self-pay | Admitting: Specialist

## 2021-01-09 ENCOUNTER — Other Ambulatory Visit: Payer: Self-pay

## 2021-01-09 VITALS — BP 115/74 | HR 89 | Ht 66.0 in | Wt 164.0 lb

## 2021-01-09 DIAGNOSIS — R200 Anesthesia of skin: Secondary | ICD-10-CM | POA: Diagnosis not present

## 2021-01-09 DIAGNOSIS — R87612 Low grade squamous intraepithelial lesion on cytologic smear of cervix (LGSIL): Secondary | ICD-10-CM

## 2021-01-09 DIAGNOSIS — R296 Repeated falls: Secondary | ICD-10-CM | POA: Diagnosis not present

## 2021-01-09 DIAGNOSIS — M4726 Other spondylosis with radiculopathy, lumbar region: Secondary | ICD-10-CM

## 2021-01-09 DIAGNOSIS — R202 Paresthesia of skin: Secondary | ICD-10-CM

## 2021-01-09 DIAGNOSIS — R87618 Other abnormal cytological findings on specimens from cervix uteri: Secondary | ICD-10-CM

## 2021-01-09 LAB — CYTOLOGY - PAP
Comment: NEGATIVE
Comment: NEGATIVE
HPV 16: POSITIVE — AB
HPV 18 / 45: NEGATIVE
High risk HPV: POSITIVE — AB

## 2021-01-09 MED ORDER — FLUCONAZOLE 150 MG PO TABS: 150.0000 mg | ORAL_TABLET | Freq: Once | ORAL | 0 refills | Status: AC

## 2021-01-09 MED ORDER — DULOXETINE HCL 60 MG PO CPEP: ORAL_CAPSULE | ORAL | 3 refills | Status: DC

## 2021-01-09 NOTE — Progress Notes (Signed)
Office Visit Note   Patient: Stacy [name]           Date of Birth: 1961/01/23           MRN: [id number] Visit Date: 01/09/2021              Requested by: Marcine Matar, MD [address] [place],  [place] [address] PCP: Marcine Matar, MD   Assessment & Plan: Visit Diagnoses:  1. Other spondylosis with radiculopathy, lumbar region   2. Numbness and tingling of right arm   3. Falls frequently     Plan: Avoid bending, stooping and avoid lifting weights greater than 10 lbs. Avoid prolong standing and walking. Avoid frequent bending and stooping  No lifting greater than 10 lbs. May use ice or moist heat for pain. Weight loss is of benefit. Handicap license is approved. Dr. Ashley Blas secretary/Assistant will call to arrange for selective nerve root blocks left L3 and L4.  Undergoing Cervical MRI due to UE numbness and paresthesias to assess for signs of nerve compression.  Follow-Up Instructions: Return in about 3 weeks (around 01/30/2021).   Orders:  Orders Placed This Encounter  Procedures   Ambulatory referral to Physical Medicine Rehab   Meds ordered this encounter  Medications   DULoxetine (CYMBALTA) 60 MG capsule    Sig: TAKE 1 CAPSULE (60 MG TOTAL) BY MOUTH DAILY (AM)    Dispense:  30 capsule    Refill:  3      Procedures: No procedures performed   Clinical Data: No additional findings.   Subjective: No chief complaint on file.   60 year old female with chronic knee pain, post right TKR with 2 manipulations for TKR now with good ROM and left knee and leg pain. She has had two decompression surgeries for mild disc protrusions and lateral recess stenosis with complains now of ongoing left leg numbness and tingling with weakness in standing and walking such that the left leg wants to give away. She is seen today. In the interval saw Dr. Nita Sickle Neurology and she is recommending an MRI of the cervical spine due her increased reflexes and  complaints of numbness and tingling in the arms with negative EMG/NCV. The study for the left leg was also unremarkable for nerve findings and I am concerned that she is having intermittant neuroforamenal narrowing of the left L3 and L4.   Review of Systems  Constitutional: Negative.   HENT: Negative.    Eyes: Negative.   Respiratory: Negative.    Cardiovascular: Negative.   Gastrointestinal: Negative.   Endocrine: Negative.   Genitourinary: Negative.   Musculoskeletal: Negative.   Skin: Negative.   Allergic/Immunologic: Negative.   Neurological: Negative.   Hematological: Negative.   Psychiatric/Behavioral: Negative.      Objective: Vital Signs: BP 115/74   Pulse 89   Ht 5\' 6"  (1.676 m)   Wt 164 lb (74.4 kg)   LMP 09/01/2010   BMI 26.47 kg/m   Physical Exam Constitutional:      Appearance: She is well-developed.  HENT:     Head: Normocephalic and atraumatic.  Eyes:     Pupils: Pupils are equal, round, and reactive to light.  Pulmonary:     Effort: Pulmonary effort is normal.     Breath sounds: Normal breath sounds.  Abdominal:     General: Bowel sounds are normal.     Palpations: Abdomen is soft.  Musculoskeletal:     Cervical back: Normal range of  motion and neck supple.  Skin:    General: Skin is warm and dry.  Neurological:     Mental Status: She is alert and oriented to person, place, and time.  Psychiatric:        Behavior: Behavior normal.        Thought Content: Thought content normal.        Judgment: Judgment normal.   Back Exam   Tenderness  The patient is experiencing tenderness in the lumbar.  Range of Motion  Extension:  abnormal  Flexion:  abnormal  Lateral bend right:  abnormal  Lateral bend left:  abnormal  Rotation right:  abnormal  Rotation left:  abnormal   Muscle Strength  Right Quadriceps:  5/5  Left Quadriceps:  5/5  Right Hamstrings:  5/5  Left Hamstrings:  5/5   Other  Toe walk: abnormal Heel walk:  abnormal    Specialty Comments:  No specialty comments available.  Imaging: No results found.   PMFS History: Patient Active Problem List   Diagnosis Date Noted   Herniation of lumbar intervertebral disc with radiculopathy 10/02/2016    Priority: High    Class: Chronic   Chondromalacia of both patellae 06/03/2015    Priority: High    Class: Chronic   Colon polyps 06/06/2020   Lupus ([place]) 06/06/2020   Allergic rhinitis due to animal (cat) (dog) hair and dander 04/08/2020   Allergic rhinitis due to pollen 04/08/2020   Food allergy 04/08/2020   Acute medial meniscus tear, left, subsequent encounter 03/21/2020   Chronic pain of left knee 02/15/2020   Paresthesia of skin 11/16/2019   History of total knee replacement, right 11/16/2019   Tobacco abuse 11/16/2019   Centrilobular emphysema ([place]) 08/10/2019   Incidental lung nodule, > 52mm and < 68mm 08/10/2019   OSA on CPAP 08/10/2019   Dyspnea on exertion 08/02/2019   Hyperlipidemia 08/02/2019   Lumbar radiculopathy 04/24/2019   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   Post laminectomy syndrome 02/23/2019   Abnormality of gait 02/23/2019   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   Unilateral primary osteoarthritis, left hip 12/28/2018   Lesion of skin of left ear 12/26/2018   Primary osteoarthritis of left hip 12/02/2018   Iron deficiency anemia 10/16/2018   Chronic pain syndrome 09/08/2018   Chronic pain of right knee 08/11/2018   Status post lumbar laminectomy 07/15/2018   Peripheral arterial disease ([place]) 04/05/2018   Moderate persistent asthma without complication 123456   Environmental and seasonal allergies 06/29/2017   Controlled type 2 diabetes mellitus with diabetic polyneuropathy, without long-term current use of insulin ([place]) 06/29/2017   Perennial allergic rhinitis 04/08/2017   Sensorineural hearing loss (SNHL), bilateral 04/08/2017   Chronic pansinusitis 03/25/2017   Eustachian tube dysfunction, bilateral  0000000   Lichen planopilaris [id number]   Alopecia areata 08/19/2016   Spinal stenosis, lumbar region, with neurogenic claudication 06/03/2015   Tobacco use disorder 04/25/2015   DJD (degenerative joint disease) of knee 01/04/2015   Hemorrhoid 11/14/2014   Gout of big toe 07/19/2014   Essential hypertension 08/14/2013   Gastroesophageal reflux disease without esophagitis 08/14/2013   COPD (chronic obstructive pulmonary disease) ([place]) 04/17/2011   Past Medical History:  Diagnosis Date   Allergy    Shellfish, cleaning products   Anxiety    Arthritis    Arthrofibrosis of total knee replacement ([place])    right   Asthma    COPD (chronic obstructive pulmonary disease) ([place])    Depression  Diabetes mellitus    Type II   GERD (gastroesophageal reflux disease)    Pt on Protonix daily   Glaucoma    Gout    Headache(784.0)    otc meds prn   Hyperlipidemia    Hypertension    Irritable bowel syndrome 11/19/2010   Neuropathy    Pneumonia YRS AGO   Restless legs    Shortness of breath    07/14/2018- uses  4 times a day    Family History  Problem Relation Age of Onset   Hypertension Father    Cancer Father    Heart disease Mother    Asthma Son        had as a child   Heart disease Sister    Breast cancer Sister    Hypertension Brother     Past Surgical History:  Procedure Laterality Date   BACK SURGERY     CHOLECYSTECTOMY     COLONOSCOPY     ENDOMETRIAL ABLATION  10/2010   EYE SURGERY     HERNIA REPAIR     umbicial hernia   JOINT REPLACEMENT Left 03/14/2019   Dr. Ninfa Linden hip   KNEE ARTHROSCOPY Left    06/07/2017 Dr. Marlou Sa of [facility] ARTHROSCOPY Left 03/21/2020   Procedure: LEFT KNEE ARTHROSCOPY WITH PARTIAL MEDIAL MENISCECTOMY;  Surgeon: Mcarthur Rossetti, MD;  Location: [facility];  Service: Orthopedics;  Laterality: Left;   KNEE CLOSED REDUCTION Right 12/06/2015   Procedure: CLOSED MANIPULATION RIGHT KNEE;  Surgeon: [name],  MD;  Location: [place];  Service: Orthopedics;  Laterality: Right;   KNEE CLOSED REDUCTION Right 01/17/2016   Procedure: CLOSED MANIPULATION RIGHT KNEE;  Surgeon: [name], MD;  Location: [facility];  Service: Orthopedics;  Laterality: Right;   KNEE JOINT MANIPULATION Right 12/06/2015   LACRIMAL TUBE INSERTION Bilateral 03/01/2019   Procedure: LACRIMAL TUBE INSERTION;  Surgeon: Gevena Cotton, MD;  Location[facility];  Service: Ophthalmology;  Laterality: Bilateral;   LACRIMAL TUBE REMOVAL Bilateral 05/03/2019   Procedure: BILATERAL NASOLACRIMAL DUCT PROBING, IIRIGATION AND TUBE REMOVAL BOTH EYES;  Surgeon: Gevena Cotton, MD;  Location: [place];  Service: Ophthalmology;  Laterality: Bilateral;   LUMBAR DISC SURGERY  06/03/2015   L 2  L3 L4 L5    LUMBAR LAMINECTOMY/DECOMPRESSION MICRODISCECTOMY N/A 06/03/2015   Procedure: Bilateral lateral recess decompression L2-3, L3-4, L4-5;  Surgeon: [name], MD;  Location: [place];  Service: Orthopedics;  Laterality: N/A;   LUMBAR LAMINECTOMY/DECOMPRESSION MICRODISCECTOMY N/A 10/02/2016   Procedure: Right L5-S1 Lateral Recess Decompression  microdiscectomy;  Surgeon: [name], MD;  Location: [place];  Service: Orthopedics;  Laterality: N/A;   LUMBAR LAMINECTOMY/DECOMPRESSION MICRODISCECTOMY N/A 07/15/2018   Procedure: LEFT L3-4 MICRODISCECTOMY;  Surgeon: [name], MD;  Location: [place];  Service: Orthopedics;  Laterality: N/A;   svd      x 2   TEAR DUCT PROBING Bilateral 03/01/2019   Procedure: TEAR DUCT PROBING WITH IRRIGATION;  Surgeon: Gevena Cotton, MD;  Location: [facility];  Service: Ophthalmology;  Laterality: Bilateral;   TOTAL HIP ARTHROPLASTY Left 03/14/2019   Procedure: LEFT TOTAL HIP ARTHROPLASTY ANTERIOR APPROACH;  Surgeon: Mcarthur Rossetti, MD;  Location: [place];  Service: Orthopedics;  Laterality: Left;   TOTAL KNEE ARTHROPLASTY Right 09/06/2015   Procedure: RIGHT TOTAL KNEE  ARTHROPLASTY;  Surgeon: [name], MD;  Location: [place];  Service: Orthopedics;  Laterality: Right;   TUBAL  LIGATION     UPPER GASTROINTESTINAL ENDOSCOPY  04/28/11   Social History   Occupational History   Occupation: unemployed    Fish farm manager: UNEMPLOYED  Tobacco Use   Smoking status: Some Days    Packs/day: 0.25    Years: 32.00    Pack years: 8.00    Types: Cigarettes   Smokeless tobacco: Never   Tobacco comments:    DOWN TO 3 PER DAY  Vaping Use   Vaping Use: Never used  Substance and Sexual Activity   Alcohol use: No   Drug use: No   Sexual activity: Yes    Birth control/protection: Surgical, Post-menopausal    Comment: tubal ligation

## 2021-01-09 NOTE — Patient Instructions (Signed)
Avoid bending, stooping and avoid lifting weights greater than 10 lbs. Avoid prolong standing and walking. Avoid frequent bending and stooping  No lifting greater than 10 lbs. May use ice or moist heat for pain. Weight loss is of benefit. Handicap license is approved. Dr. Jenkins Blas secretary/Assistant will call to arrange for selective nerve root blocks left L3 and L4.  Undergoing Cervical MRI due to UE numbness and paresthesias to assess for signs of nerve compression.

## 2021-01-11 ENCOUNTER — Ambulatory Visit
Admission: RE | Admit: 2021-01-11 | Discharge: 2021-01-11 | Disposition: A | Discharge status: Home / Self Care | Payer: Medicaid Other | Source: Ambulatory Visit | Attending: Neurology | Admitting: Neurology

## 2021-01-11 DIAGNOSIS — R292 Abnormal reflex: Secondary | ICD-10-CM

## 2021-01-11 DIAGNOSIS — R202 Paresthesia of skin: Secondary | ICD-10-CM

## 2021-01-11 DIAGNOSIS — M4802 Spinal stenosis, cervical region: Secondary | ICD-10-CM | POA: Diagnosis not present

## 2021-01-15 DIAGNOSIS — F331 Major depressive disorder, recurrent, moderate: Secondary | ICD-10-CM | POA: Diagnosis not present

## 2021-01-20 ENCOUNTER — Ambulatory Visit: Payer: Medicaid Other | Attending: Internal Medicine | Admitting: Internal Medicine

## 2021-01-20 ENCOUNTER — Other Ambulatory Visit: Payer: Self-pay

## 2021-01-20 VITALS — BP 127/84 | HR 99 | Ht 66.0 in | Wt 163.8 lb

## 2021-01-20 DIAGNOSIS — Z79899 Other long term (current) drug therapy: Secondary | ICD-10-CM | POA: Insufficient documentation

## 2021-01-20 DIAGNOSIS — E1142 Type 2 diabetes mellitus with diabetic polyneuropathy: Secondary | ICD-10-CM | POA: Insufficient documentation

## 2021-01-20 DIAGNOSIS — Z23 Encounter for immunization: Secondary | ICD-10-CM | POA: Diagnosis not present

## 2021-01-20 DIAGNOSIS — F17210 Nicotine dependence, cigarettes, uncomplicated: Secondary | ICD-10-CM | POA: Diagnosis not present

## 2021-01-20 DIAGNOSIS — G4733 Obstructive sleep apnea (adult) (pediatric): Secondary | ICD-10-CM | POA: Insufficient documentation

## 2021-01-20 DIAGNOSIS — M170 Bilateral primary osteoarthritis of knee: Secondary | ICD-10-CM | POA: Diagnosis not present

## 2021-01-20 DIAGNOSIS — F17200 Nicotine dependence, unspecified, uncomplicated: Secondary | ICD-10-CM

## 2021-01-20 DIAGNOSIS — J432 Centrilobular emphysema: Secondary | ICD-10-CM | POA: Diagnosis not present

## 2021-01-20 DIAGNOSIS — Z01818 Encounter for other preprocedural examination: Secondary | ICD-10-CM | POA: Diagnosis not present

## 2021-01-20 DIAGNOSIS — M48062 Spinal stenosis, lumbar region with neurogenic claudication: Secondary | ICD-10-CM | POA: Insufficient documentation

## 2021-01-20 DIAGNOSIS — M329 Systemic lupus erythematosus, unspecified: Secondary | ICD-10-CM | POA: Diagnosis not present

## 2021-01-20 DIAGNOSIS — I10 Essential (primary) hypertension: Secondary | ICD-10-CM | POA: Insufficient documentation

## 2021-01-20 DIAGNOSIS — Z7984 Long term (current) use of oral hypoglycemic drugs: Secondary | ICD-10-CM | POA: Insufficient documentation

## 2021-01-20 DIAGNOSIS — R918 Other nonspecific abnormal finding of lung field: Secondary | ICD-10-CM | POA: Diagnosis not present

## 2021-01-20 DIAGNOSIS — E1151 Type 2 diabetes mellitus with diabetic peripheral angiopathy without gangrene: Secondary | ICD-10-CM | POA: Diagnosis not present

## 2021-01-20 DIAGNOSIS — J449 Chronic obstructive pulmonary disease, unspecified: Secondary | ICD-10-CM | POA: Diagnosis not present

## 2021-01-20 DIAGNOSIS — E785 Hyperlipidemia, unspecified: Secondary | ICD-10-CM | POA: Insufficient documentation

## 2021-01-20 LAB — POCT GLYCOSYLATED HEMOGLOBIN (HGB A1C): HbA1c, POC (controlled diabetic range): 6.3 % (ref 0.0–7.0)

## 2021-01-20 NOTE — Progress Notes (Signed)
Patient ID: Stacy [name] [facility], female    DOB: 07/10/1960  MRN: [id number]  CC: pre-op eval  Subjective: Stacy [name] is a 60 y.o. female who presents for pre-op eval Her concerns today include:  Pt with hx of HTN, DM with neuropathy, tob dep, HL, PAD, IDA, lupus, spinal stenosis with neurogenic claudication (s/p laminectomy 07/2018), COPD,OSA on CPAP,LS, OA knees, gout, recurrent sinusitis, receiving allergy shots from Dr. Faith Rogue.  Patient will be having lumbar laminectomy/fusion by Dr. [name].  Will be scheduled for after her next appointment with him which is scheduled for 02/07/2020 . No problems with anesthesia in the past.  Not very mobile in the past several mths due to chronic back issues but prior to that she reports she was able to walk 2-3 blocks with no signifiant CP.  She does have to use albuterol inhaler if she is going to do a lot of walking.  Patient had echo done last year that revealed normal EF without significant valvular heart disease.  Taking Diovan/HCTZ daily. She limits salt in foods Recent home BP check was 121/85.   No CP, LE edema  COPD:  using Spirva and Symbicort as prescribed.  Does neb treatment 3x/day. No recent flares. No recent productive cough. Down to 2 cigarettes a day.  Using nicotine patches. Hope to quit by christmas.  OSA: using CPAP every night.  Wakes up feeling refresh.   DM: Results for orders placed or performed in visit on 01/20/21  POCT glycosylated hemoglobin (Hb A1C)  Result Value Ref Range   Hemoglobin A1C     HbA1c POC (<> result, manual entry)     HbA1c, POC (prediabetic range)     HbA1c, POC (controlled diabetic range) 6.3 0.0 - 7.0 %   *Note: Due to a large number of results and/or encounters for the requested time period, some results have not been displayed. A complete set of results can be found in Results Review.  -on Metformin and Amaryl.  Checking BS TID.  Gives range 112-123.  No low BS.  -eats fruits for  breakfast, usually makes salad for lunch.  -has eye exam tomorrow with Dr. Frederico Hamman.  Bilateral lung nodules:  will be due for repeat CT scan end of January for follow-up on these nodules.  HM:  has appt with GYN 02/24/2021 for +HPV on pap.  Need for PCV 15. Patient Active Problem List   Diagnosis Date Noted   Colon polyps 06/06/2020   Lupus ([place]) 06/06/2020   Allergic rhinitis due to animal (cat) (dog) hair and dander 04/08/2020   Allergic rhinitis due to pollen 04/08/2020   Food allergy 04/08/2020   Acute medial meniscus tear, left, subsequent encounter 03/21/2020   Chronic pain of left knee 02/15/2020   Paresthesia of skin 11/16/2019   History of total knee replacement, right 11/16/2019   Tobacco abuse 11/16/2019   Centrilobular emphysema ([place]) 08/10/2019   Incidental lung nodule, > 25m and < 851m07/09/2019   OSA on CPAP 08/10/2019   Dyspnea on exertion 08/02/2019   Hyperlipidemia 08/02/2019   Lumbar radiculopathy 04/24/2019   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   Post laminectomy syndrome 02/23/2019   Abnormality of gait 02/23/2019   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   Unilateral primary osteoarthritis, left hip 12/28/2018   Lesion of skin of left ear 12/26/2018   Primary osteoarthritis of left hip 12/02/2018   Iron deficiency anemia 10/16/2018   Chronic pain syndrome 09/08/2018   Chronic pain  of right knee 08/11/2018   Status post lumbar laminectomy 07/15/2018   Peripheral arterial disease ([place]) 04/05/2018   Moderate persistent asthma without complication 58/85/0277   Environmental and seasonal allergies 06/29/2017   Controlled type 2 diabetes mellitus with diabetic polyneuropathy, without long-term current use of insulin (HCC) 06/29/2017   Perennial allergic rhinitis 04/08/2017   Sensorineural hearing loss (SNHL), bilateral 04/08/2017   Chronic pansinusitis 03/25/2017   Eustachian tube dysfunction, bilateral 41/28/7867   Lichen planopilaris 67/20/9470    Herniation of lumbar intervertebral disc with radiculopathy 10/02/2016    Class: Chronic   Alopecia areata 08/19/2016   Chondromalacia of both patellae 06/03/2015    Class: Chronic   Spinal stenosis, lumbar region, with neurogenic claudication 06/03/2015   Tobacco use disorder 04/25/2015   DJD (degenerative joint disease) of knee 01/04/2015   Hemorrhoid 11/14/2014   Gout of big toe 07/19/2014   Essential hypertension 08/14/2013   Gastroesophageal reflux disease without esophagitis 08/14/2013   COPD (chronic obstructive pulmonary disease) ([place]) 04/17/2011     Current Outpatient Medications on File Prior to Visit  Medication Sig Dispense Refill   ACCU-CHEK AVIVA PLUS test strip USE 3 TIMES A DAY AS DIRECTED BY PHYSICIAN 100 each 12   Accu-Chek Softclix Lancets lancets USE AS INSTRUCTED 3 TIMES A DAY 100 each 12   albuterol (PROVENTIL) (2.5 MG/3ML) 0.083% nebulizer solution USE ONE VIAL (2.5 MG TOTAL) BY NEBULIZATION EVERY 6 (SIX) HOURS AS NEEDED FOR WHEEZING. 300 mL 0   ALLERGY RELIEF 10 MG tablet TAKE 1 TABLET (10 MG TOTAL) BY MOUTH DAILY. (AM) 30 tablet 3   allopurinol (ZYLOPRIM) 100 MG tablet TAKE 1 TABLET (100 MG TOTAL) BY MOUTH DAILY.(AM) 90 tablet 3   atorvastatin (LIPITOR) 20 MG tablet TAKE 1 TABLET (20 MG TOTAL) BY MOUTH DAILY (BEDTIME) 90 tablet 2   Blood Glucose Monitoring Suppl (ACCU-CHEK AVIVA PLUS) w/Device KIT 1 each by Does not apply route 3 (three) times daily. 1 kit 0   budesonide-formoterol (SYMBICORT) 80-4.5 MCG/ACT inhaler INHALE TWO PUFFS BY MOUTH TWICE A DAY 1 Inhaler 11   celecoxib (CELEBREX) 200 MG capsule TAKE 1 CAPSULE (200 MG TOTAL) BY MOUTH 2 (TWO) TIMES DAILY. (AM+BEDTIME) 180 capsule 3   clopidogrel (PLAVIX) 75 MG tablet TAKE 1 TABLET (75 MG TOTAL) BY MOUTH DAILY. (AM) 30 tablet 2   diclofenac Sodium (VOLTAREN) 1 % GEL APPLY 4 GRAMS TOPICALLY 4 (FOUR) TIMES DAILY. 400 g 6   dicyclomine (BENTYL) 10 MG capsule Tale one capsule 3 times daily     DULoxetine  (CYMBALTA) 60 MG capsule TAKE 1 CAPSULE (60 MG TOTAL) BY MOUTH DAILY (AM) 30 capsule 3   EPINEPHrine 0.3 mg/0.3 mL IJ SOAJ injection 0.3 mg IM x 1 PRN for allergic reaction (Patient taking differently: Inject 0.3 mg into the muscle once as needed for anaphylaxis.) 1 Device 1   FLOWFLEX COVID-19 AG HOME TEST KIT See admin instructions.     fluticasone (FLONASE) 50 MCG/ACT nasal spray Place 1 spray into both nostrils daily. 16 g 6   gabapentin (NEURONTIN) 600 MG tablet TAKE ONE CAPSULE BY MOUTH THREE TIMES A DAY (AM+NOON+BEDTIME) 90 tablet 1   glimepiride (AMARYL) 2 MG tablet TAKE 1 TABLET (2 MG TOTAL) BY MOUTH DAILY. (AM) 90 tablet 1   HYDROcodone-acetaminophen (NORCO/VICODIN) 5-325 MG tablet Take 1-2 tablets by mouth every 6 (six) hours as needed for moderate pain. 30 tablet 0   hydrOXYzine (ATARAX/VISTARIL) 10 MG tablet TAKE 1 TABLET IN THE MORNING AND NOON AND 2  TABLETS IN THE EVENING AS NEEDED. 120 tablet 2   Lancets (ACCU-CHEK SOFT TOUCH) lancets Use as instructed 100 each 12   loperamide (IMODIUM) 2 MG capsule TAKE 1 CAPSULE (2 MG TOTAL) BY MOUTH 4 (FOUR) TIMES DAILY AS NEEDED FOR DIARRHEA OR LOOSE STOOLS. 15 capsule 0   loratadine (CLARITIN) 10 MG tablet Take 10 mg by mouth daily.     meloxicam (MOBIC) 15 MG tablet TAKE 1 TABLET (15 MG TOTAL) BY MOUTH DAILY. (AM) 30 tablet 0   metFORMIN (GLUCOPHAGE-XR) 750 MG 24 hr tablet TAKE 1 TABLET (750 MG TOTAL) BY MOUTH 2 (TWO) TIMES DAILY. 60 tablet 3   methocarbamol (ROBAXIN) 750 MG tablet TAKE 1 TABLET (750 MG TOTAL) BY MOUTH EVERY 12 (TWELVE) HOURS AS NEEDED FOR MUSCLE SPASMS. 40 tablet 1   metoCLOPramide (REGLAN) 5 MG tablet Take 5 mg by mouth 3 (three) times daily as needed. Take 1 tablet by mouth three times a day before meals as needed for nausea and vomiting.     metroNIDAZOLE (FLAGYL) 500 MG tablet Take 1 tablet (500 mg total) by mouth 2 (two) times daily. 14 tablet 0   montelukast (SINGULAIR) 10 MG tablet Take 10 mg by mouth at bedtime.       Multiple Vitamins-Minerals (CENTRUM SILVER 50+WOMEN PO) Take by mouth.     nicotine (NICODERM CQ - DOSED IN MG/24 HOURS) 14 mg/24hr patch PLACE 1 PATCH ONTO THE SKIN DAILY 28 patch 1   pantoprazole (PROTONIX) 40 MG tablet Take 1 tablet (40 mg total) by mouth daily. 30 tablet 2   polyethylene glycol (MIRALAX / GLYCOLAX) 17 g packet      potassium chloride SA (KLOR-CON) 20 MEQ tablet TAKE ONE TABLET BY MOUTH ONCE DAILY (NOON) 90 tablet 1   Respiratory Therapy Supplies (FLUTTER) DEVI Use after breathing treatment 4 times daily 1 each 0   SPIRIVA HANDIHALER 18 MCG inhalation capsule PLACE 1 CAPSULE (18 MCG TOTAL) INTO INHALER AND INHALE DAILY. 30 capsule 2   sucralfate (CARAFATE) 1 g tablet Take 1 g by mouth 4 (four) times daily -  with meals and at bedtime.     triamcinolone cream (KENALOG) 0.1 % Apply 1 application topically 2 (two) times daily. 30 g 0   valsartan-hydrochlorothiazide (DIOVAN-HCT) 160-12.5 MG tablet TAKE ONE TABLET BY MOUTH ONCE DAILY (AM) 30 tablet 2   [DISCONTINUED] Fluticasone-Salmeterol (ADVAIR) 500-50 MCG/DOSE AEPB Inhale 1 puff into the lungs every 12 (twelve) hours.       [DISCONTINUED] lisinopril (PRINIVIL,ZESTRIL) 40 MG tablet Take 40 mg by mouth daily.       No current facility-administered medications on file prior to visit.    Allergies  Allergen Reactions   Other Shortness Of Breath    UNSPECIFIED AGENTS Allergic to perfumes and cleaning products   Shellfish Allergy Anaphylaxis    Per allergy test.   Ace Inhibitors Cough and Other (See Comments)        Celecoxib     Other reaction(s): upset stomach   Shellfish-Derived Products Other (See Comments)   Aspirin Nausea Only    Other reaction(s): stomach upset Other reaction(s): Unknown    Social History   Socioeconomic History   Marital status: Married    Spouse name: Not on file   Number of children: 2   Years of education: Not on file   Highest education level: Not on file  Occupational History    Occupation: unemployed    Employer: UNEMPLOYED  Tobacco Use   Smoking status: Some  Days    Packs/day: 0.25    Years: 32.00    Pack years: 8.00    Types: Cigarettes   Smokeless tobacco: Never   Tobacco comments:    DOWN TO 3 PER DAY  Vaping Use   Vaping Use: Never used  Substance and Sexual Activity   Alcohol use: No   Drug use: No   Sexual activity: Yes    Birth control/protection: Surgical, Post-menopausal    Comment: tubal ligation  Other Topics Concern   Not on file  Social History Narrative   Left Handed    Lives in a two story apartment.    Drinks Caffeine    Social Determinants of Health   Financial Resource Strain: Not on file  Food Insecurity: Not on file  Transportation Needs: Not on file  Physical Activity: Not on file  Stress: Not on file  Social Connections: Not on file  Intimate Partner Violence: Not on file    Family History  Problem Relation Age of Onset   Hypertension Father    Cancer Father    Heart disease Mother    Asthma Son        had as a child   Heart disease Sister    Breast cancer Sister    Hypertension Brother     Past Surgical History:  Procedure Laterality Date   BACK SURGERY     CHOLECYSTECTOMY     COLONOSCOPY     ENDOMETRIAL ABLATION  10/2010   EYE SURGERY     HERNIA REPAIR     umbicial hernia   JOINT REPLACEMENT Left 03/14/2019   Dr. Ninfa Linden hip   KNEE ARTHROSCOPY Left    06/07/2017 Dr. Marlou Sa of [place] ARTHROSCOPY Left 03/21/2020   Procedure: LEFT KNEE ARTHROSCOPY WITH PARTIAL MEDIAL MENISCECTOMY;  Surgeon: Mcarthur Rossetti, MD;  Location: [place];  Service: Orthopedics;  Laterality: Left;   KNEE CLOSED REDUCTION Right 12/06/2015   Procedure: CLOSED MANIPULATION RIGHT KNEE;  Surgeon: [name], MD;  Location: [place];  Service: Orthopedics;  Laterality: Right;   KNEE CLOSED REDUCTION Right 01/17/2016   Procedure: CLOSED MANIPULATION RIGHT KNEE;  Surgeon: [name], MD;  Location:  [place];  Service: Orthopedics;  Laterality: Right;   KNEE JOINT MANIPULATION Right 12/06/2015   LACRIMAL TUBE INSERTION Bilateral 03/01/2019   Procedure: LACRIMAL TUBE INSERTION;  Surgeon: Gevena Cotton, MD;  Location: [facility];  Service: Ophthalmology;  Laterality: Bilateral;   LACRIMAL TUBE REMOVAL Bilateral 05/03/2019   Procedure: BILATERAL NASOLACRIMAL DUCT PROBING, IIRIGATION AND TUBE REMOVAL BOTH EYES;  Surgeon: Gevena Cotton, MD;  Location: [place];  Service: Ophthalmology;  Laterality: Bilateral;   LUMBAR DISC SURGERY  06/03/2015   L 2  L3 L4 L5    LUMBAR LAMINECTOMY/DECOMPRESSION MICRODISCECTOMY N/A 06/03/2015   Procedure: Bilateral lateral recess decompression L2-3, L3-4, L4-5;  Surgeon: [name], MD;  Location: [place];  Service: Orthopedics;  Laterality: N/A;   LUMBAR LAMINECTOMY/DECOMPRESSION MICRODISCECTOMY N/A 10/02/2016   Procedure: Right L5-S1 Lateral Recess Decompression  microdiscectomy;  Surgeon: [name], MD;  Location: [place];  Service: Orthopedics;  Laterality: N/A;   LUMBAR LAMINECTOMY/DECOMPRESSION MICRODISCECTOMY N/A 07/15/2018   Procedure: LEFT L3-4 MICRODISCECTOMY;  Surgeon: [name], MD;  Location: [place];  Service: Orthopedics;  Laterality: N/A;   svd      x 2   TEAR DUCT PROBING Bilateral 03/01/2019   Procedure: TEAR DUCT PROBING WITH IRRIGATION;  Surgeon: Gevena Cotton, MD;  Location: [facility];  Service: Ophthalmology;  Laterality: Bilateral;   TOTAL HIP ARTHROPLASTY Left 03/14/2019   Procedure: LEFT TOTAL HIP ARTHROPLASTY ANTERIOR APPROACH;  Surgeon: Mcarthur Rossetti, MD;  Location: [place];  Service: Orthopedics;  Laterality: Left;   TOTAL KNEE ARTHROPLASTY Right 09/06/2015   Procedure: RIGHT TOTAL KNEE ARTHROPLASTY;  Surgeon: [name], MD;  Location: [place];  Service: Orthopedics;  Laterality: Right;   TUBAL LIGATION     UPPER GASTROINTESTINAL ENDOSCOPY  04/28/11    ROS: Review of  Systems Negative except as stated above  PHYSICAL EXAM: BP 127/84    Pulse 99    Ht _0  (1.676 m)    Wt 163 lb 12.8 oz (74.3 kg)    LMP 09/01/2010    SpO2 97%    BMI 26.44 kg/m   Physical Exam  General appearance - alert, well appearing, and in no distress Mental status - normal mood, behavior, speech, dress, motor activity, and thought processes Eyes - pupils equal and reactive, extraocular eye movements intact Nose - normal and patent, no erythema, discharge or polyps Mouth - mucous membranes moist, pharynx normal without lesions Neck - supple, no significant adenopathy Chest - clear to auscultation, no wheezes, rales or rhonchi, symmetric air entry Heart - normal rate, regular rhythm, normal S1, S2, no murmurs, rubs, clicks or gallops Musculoskeletal -patient in wheelchair.  She is wearing a back brace. Extremities - peripheral pulses normal, no pedal edema, no clubbing or cyanosis   CMP Latest Ref Rng & Units 03/18/2020 08/02/2019 05/03/2019  Glucose 70 - 99 mg/dL 169(H) - 125(H)  BUN 6 - 20 mg/dL 7 - 11  Creatinine 0.44 - 1.00 mg/dL 0.74 - 0.72  Sodium 135 - 145 mmol/L 138 - 137  Potassium 3.5 - 5.1 mmol/L 3.7 - 3.5  Chloride 98 - 111 mmol/L 100 - 103  CO2 22 - 32 mmol/L 26 - 25  Calcium 8.9 - 10.3 mg/dL 9.1 - 9.2  Total Protein 6.0 - 8.5 g/dL - 7.3 -  Total Bilirubin 0.0 - 1.2 mg/dL - 0.2 -  Alkaline Phos 48 - 121 IU/L - 129(H) -  AST 0 - 40 IU/L - 17 -  ALT 0 - 32 IU/L - 12 -   Lipid Panel     Component Value Date/Time   CHOL 160 01/17/2020 0817   TRIG 191 (H) 01/17/2020 0817   HDL 43 01/17/2020 0817   CHOLHDL 3.7 01/17/2020 0817   CHOLHDL 4.2 07/19/2014 1202   VLDL 24 07/19/2014 1202   LDLCALC 85 01/17/2020 0817    CBC    Component Value Date/Time   WBC 7.1 01/15/2020 1133   WBC 8.0 03/27/2019 1441   RBC 4.36 01/15/2020 1133   RBC 3.79 (L) 03/27/2019 1441   HGB 12.2 01/15/2020 1133   HCT 36.4 01/15/2020 1133   PLT 295 01/15/2020 1133   MCV 84 01/15/2020  1133   MCH 28.0 01/15/2020 1133   MCH 27.7 03/27/2019 1441   MCHC 33.5 01/15/2020 1133   MCHC 31.8 03/27/2019 1441   RDW 13.5 01/15/2020 1133   LYMPHSABS 4.1 (H) 10/14/2018 1704   MONOABS 0.5 03/18/2018 0252   EOSABS 0.1 10/14/2018 1704   BASOSABS 0.0 10/14/2018 1704  EKG: Normal sinus rhythm.  Normal EKG.  ASSESSMENT AND PLAN: 1. Preoperative evaluation to rule out surgical contraindication Patient with Mace score of 1.  Her chronic medical conditions are optimized.  Barring any significant abnormalities  on labs that are being drawn today, we can move forward with approving her for the surgery. - EKG 12-Lead  2. Type 2 diabetes mellitus with diabetic polyneuropathy, without long-term current use of insulin (HCC) At goal.  Continue oral medications and healthy eating habits. - POCT glycosylated hemoglobin (Hb A1C) - CBC - Comprehensive metabolic panel - Lipid panel - Microalbumin / creatinine urine ratio  3. Essential hypertension Dose to goal.  Continue Diovan/HCTZ.  4. Tobacco use disorder Commended her on progress she has made so far and her goal to quit smoking.  She has set a goal to quit by Christmas which is the 25th of this month and she has been working towards that goal.  I told her that it would be a good idea for her to have quit smoking at least a month prior to her surgery.  5. Centrilobular emphysema (HCC) Stable on her current regimen of Symbicort, Spiriva and albuterol nebulizer.  6. OSA (obstructive sleep apnea) Encourage her to continue being compliant with use of her CPAP machine.  7. Lung nodules We will schedule follow-up CAT scan for the end of January. - CT CHEST NODULE FOLLOW UP LOW DOSE W/O; Future  8. Need for vaccination against Streptococcus pneumoniae - PNEUMOCOCCAL CONJUGATE VACCINE 15-VALENT    Patient was given the opportunity to ask questions.  Patient verbalized understanding of the plan and was able to repeat key elements of the plan.    Orders Placed This Encounter  Procedures   EKG 12-Lead     Requested Prescriptions    No prescriptions requested or ordered in this encounter    No follow-ups on file.  Karle Plumber, MD, FACP

## 2021-01-20 NOTE — Patient Instructions (Signed)
You should work on trying to quit smoking at least a month before your upcoming surgery.

## 2021-01-21 ENCOUNTER — Telehealth: Payer: Self-pay | Admitting: Internal Medicine

## 2021-01-21 ENCOUNTER — Other Ambulatory Visit: Payer: Self-pay | Admitting: Internal Medicine

## 2021-01-21 DIAGNOSIS — E119 Type 2 diabetes mellitus without complications: Secondary | ICD-10-CM | POA: Diagnosis not present

## 2021-01-21 DIAGNOSIS — H2511 Age-related nuclear cataract, right eye: Secondary | ICD-10-CM | POA: Diagnosis not present

## 2021-01-21 LAB — MICROALBUMIN / CREATININE URINE RATIO
Creatinine, Urine: 78.4 mg/dL
Microalb/Creat Ratio: 7 mg/g creat (ref 0–29)
Microalbumin, Urine: 5.7 ug/mL

## 2021-01-21 LAB — CBC
Hematocrit: 32.6 % — ABNORMAL LOW (ref 34.0–46.6)
Hemoglobin: 10.1 g/dL — ABNORMAL LOW (ref 11.1–15.9)
MCH: 24.5 pg — ABNORMAL LOW (ref 26.6–33.0)
MCHC: 31 g/dL — ABNORMAL LOW (ref 31.5–35.7)
MCV: 79 fL (ref 79–97)
Platelets: 393 10*3/uL (ref 150–450)
RBC: 4.12 x10E6/uL (ref 3.77–5.28)
RDW: 15.8 % — ABNORMAL HIGH (ref 11.7–15.4)
WBC: 7.4 10*3/uL (ref 3.4–10.8)

## 2021-01-21 LAB — COMPREHENSIVE METABOLIC PANEL
ALT: 13 IU/L (ref 0–32)
AST: 16 IU/L (ref 0–40)
Albumin/Globulin Ratio: 1.8 (ref 1.2–2.2)
Albumin: 4.6 g/dL (ref 3.8–4.8)
Alkaline Phosphatase: 113 IU/L (ref 44–121)
BUN/Creatinine Ratio: 9 — ABNORMAL LOW (ref 12–28)
BUN: 8 mg/dL (ref 8–27)
Bilirubin Total: <0.2 mg/dL (ref 0.0–1.2)
CO2: 24 mmol/L (ref 20–29)
Calcium: 9.8 mg/dL (ref 8.7–10.3)
Chloride: 102 mmol/L (ref 96–106)
Creatinine, Ser: 0.94 mg/dL (ref 0.57–1.00)
Globulin, Total: 2.5 g/dL (ref 1.5–4.5)
Glucose: 56 mg/dL — ABNORMAL LOW (ref 70–99)
Potassium: 4.2 mmol/L (ref 3.5–5.2)
Sodium: 139 mmol/L (ref 134–144)
Total Protein: 7.1 g/dL (ref 6.0–8.5)
eGFR: 69 mL/min/{1.73_m2} (ref >59)

## 2021-01-21 LAB — LIPID PANEL
Chol/HDL Ratio: 3.3 ratio (ref 0.0–4.4)
Cholesterol, Total: 160 mg/dL (ref 100–199)
HDL: 49 mg/dL (ref >39)
LDL Chol Calc (NIH): 94 mg/dL (ref 0–99)
Triglycerides: 91 mg/dL (ref 0–149)
VLDL Cholesterol Cal: 17 mg/dL (ref 5–40)

## 2021-01-21 MED ORDER — ATORVASTATIN CALCIUM 40 MG PO TABS: 40.0000 mg | ORAL_TABLET | Freq: Every day | ORAL | 4 refills | Status: DC

## 2021-01-21 MED ORDER — IRON (FERROUS SULFATE) 325 (65 FE) MG PO TABS: 325.0000 mg | ORAL_TABLET | Freq: Every day | ORAL | 0 refills | Status: DC

## 2021-01-21 NOTE — Telephone Encounter (Signed)
Phone call placed to patient this afternoon.  I left a message on her voicemail letting her know that I have sent the results of her labs to her MyChart account and that she should take a look at the results.

## 2021-01-22 DIAGNOSIS — F331 Major depressive disorder, recurrent, moderate: Secondary | ICD-10-CM | POA: Diagnosis not present

## 2021-01-23 ENCOUNTER — Other Ambulatory Visit: Payer: Self-pay

## 2021-01-23 ENCOUNTER — Ambulatory Visit: Payer: Self-pay | Admitting: *Deleted

## 2021-01-23 ENCOUNTER — Encounter: Payer: Self-pay | Admitting: Emergency Medicine

## 2021-01-23 ENCOUNTER — Ambulatory Visit
Admission: EM | Admit: 2021-01-23 | Discharge: 2021-01-23 | Disposition: A | Discharge status: Home / Self Care | Payer: Medicaid Other | Attending: Internal Medicine | Admitting: Internal Medicine

## 2021-01-23 DIAGNOSIS — H029 Unspecified disorder of eyelid: Secondary | ICD-10-CM | POA: Diagnosis not present

## 2021-01-23 MED ORDER — CEPHALEXIN 500 MG PO CAPS: 500.0000 mg | ORAL_CAPSULE | Freq: Four times a day (QID) | ORAL | 0 refills | Status: AC

## 2021-01-23 NOTE — Telephone Encounter (Signed)
Pt is calling because a sty popped up on her Right Eye today. No available appts at [facility] until 02/20/21. Pt is wanting some advice    Chief Complaint: stye noted this am and painful Symptoms: right eye stye smaller than end on ink pen , pain, no drainage, no redness Frequency: today  Pertinent Negatives: Patient denies no redness no drainage, no fever Disposition: [] ED /[x] Urgent Care (no appt availability in office) / [] Appointment(In office/virtual)/ []  [facility]/ [] Home Care/ [] Refused Recommended Disposition  Additional Notes:  Recommended [facility]. Patient reports she will try to go due to transportation issues and child care issues. Please advise if medication can be given.          Reason for Disposition  [1] Eyelid is very swollen AND [2] no fever  Answer Assessment - Initial Assessment Questions 1. LOCATION: "Which eye has the sty?" "Upper or lower eyelid?"     Right lower lid  2. SIZE: "How big is it?" (Note: standard pencil eraser is 6 mm)     End on ink pen  3. EYELID: "Is the eyelid swollen?" If Yes, ask: "How much?"     Eyelid swollen  4. REDNESS: "Has the redness spread onto the eyelid?"     No  5. ONSET: "When did you notice the sty?"     Today  6. VISION: "Do you have blurred vision?"      A little  7. PAIN: "Is it painful?" If Yes, ask: "How bad is the pain?"  (Scale 1-10; or mild, moderate, severe)     8 8. CONTACTS: "Do you wear contacts?"     No  9. OTHER SYMPTOMS: "Do you have any other symptoms?" (e.g., fever)     No  10. PREGNANCY: "Is there any chance you are pregnant?" "When was your last menstrual period?"       no  Protocols used: Sty-A-AH

## 2021-01-23 NOTE — Discharge Instructions (Signed)
Please follow-up with provided contact information for eye doctor as soon as possible for further evaluation and management.  You have been prescribed antibiotic to help treat infection.  Please also use warm compresses to affected area.

## 2021-01-23 NOTE — ED Triage Notes (Signed)
Believes she has a stye on her right eye starting yesterday. Reports itching, no hurting. Says it's "a little blurry."

## 2021-01-23 NOTE — ED Provider Notes (Signed)
[facility]    CSN: [id number] Arrival date & time: 01/23/21  1538      History   Chief Complaint Chief Complaint  Patient presents with   Eye Problem    HPI Stacy [name] is a 60 y.o. female.   Patient presents with upper right eyelid lesion that has been present since yesterday.  Patient reports that she had a visit with a routine eye exam with her eye doctor approximately 2 days ago but reports that she did not notice lesion at that time.  Denies blurry vision but reports that it is difficult to see at times due to lesion obstructing view.  Denies any drainage from the eye.  Denies any trauma or foreign bodies to the eye. Patient does not wear contacts.    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[place])    right   Asthma    COPD (chronic obstructive pulmonary disease) (HCC)    Depression    Diabetes mellitus    Type II   GERD (gastroesophageal reflux disease)    Pt on Protonix daily   Glaucoma    Gout    Headache(784.0)    otc meds prn   Hyperlipidemia    Hypertension    Irritable bowel syndrome 11/19/2010   Neuropathy    Pneumonia YRS AGO   Restless legs    Shortness of breath    07/14/2018- uses  4 times a day    Patient Active Problem List   Diagnosis Date Noted   Colon polyps 06/06/2020   Lupus ([place]) 06/06/2020   Allergic rhinitis due to animal (cat) (dog) hair and dander 04/08/2020   Allergic rhinitis due to pollen 04/08/2020   Food allergy 04/08/2020   Acute medial meniscus tear, left, subsequent encounter 03/21/2020   Chronic pain of left knee 02/15/2020   Paresthesia of skin 11/16/2019   History of total knee replacement, right 11/16/2019   Tobacco abuse 11/16/2019   Centrilobular emphysema ([place]) 08/10/2019   Incidental lung nodule, > 52m and < 847m07/09/2019   OSA on CPAP 08/10/2019   Dyspnea on  exertion 08/02/2019   Hyperlipidemia 08/02/2019   Lumbar radiculopathy 04/24/2019   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   Post laminectomy syndrome 02/23/2019   Abnormality of gait 02/23/2019   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   Unilateral primary osteoarthritis, left hip 12/28/2018   Lesion of skin of left ear 12/26/2018   Primary osteoarthritis of left hip 12/02/2018   Iron deficiency anemia 10/16/2018   Chronic pain syndrome 09/08/2018   Chronic pain of right knee 08/11/2018   Status post lumbar laminectomy 07/15/2018   Peripheral arterial disease ([place]/04/2018   Moderate persistent asthma without complication 0550/53/9767 Environmental and seasonal allergies 06/29/2017   Controlled type 2 diabetes mellitus with diabetic polyneuropathy, without long-term current use of insulin ([place]05/28/2019   Perennial allergic rhinitis 04/08/2017   Sensorineural hearing loss (SNHL), bilateral 04/08/2017   Chronic pansinusitis 03/25/2017   Eustachian tube dysfunction, bilateral 0234/19/3790 Lichen planopilaris 0924/10/7351 Herniation of lumbar intervertebral disc with radiculopathy 10/02/2016    Class: Chronic   Alopecia areata 08/19/2016   Chondromalacia of both patellae 06/03/2015    Class: Chronic   Spinal stenosis, lumbar region, with neurogenic claudication 06/03/2015   Tobacco use disorder 04/25/2015  DJD (degenerative joint disease) of knee 01/04/2015   Hemorrhoid 11/14/2014   Gout of big toe 07/19/2014   Essential hypertension 08/14/2013   Gastroesophageal reflux disease without esophagitis 08/14/2013   COPD (chronic obstructive pulmonary disease) ([place]) 04/17/2011    Past Surgical History:  Procedure Laterality Date   BACK SURGERY     CHOLECYSTECTOMY     COLONOSCOPY     ENDOMETRIAL ABLATION  10/2010   EYE SURGERY     HERNIA REPAIR     umbicial hernia   JOINT REPLACEMENT Left 03/14/2019   Dr. Ninfa Linden hip   KNEE ARTHROSCOPY Left    06/07/2017 Dr. Marlou Sa of  [place] ARTHROSCOPY Left 03/21/2020   Procedure: LEFT KNEE ARTHROSCOPY WITH PARTIAL MEDIAL MENISCECTOMY;  Surgeon: Mcarthur Rossetti, MD;  Location: [place];  Service: Orthopedics;  Laterality: Left;   KNEE CLOSED REDUCTION Right 12/06/2015   Procedure: CLOSED MANIPULATION RIGHT KNEE;  Surgeon: [name], MD;  Location: [place];  Service: Orthopedics;  Laterality: Right;   KNEE CLOSED REDUCTION Right 01/17/2016   Procedure: CLOSED MANIPULATION RIGHT KNEE;  Surgeon: [name], MD;  Location: [place];  Service: Orthopedics;  Laterality: Right;   KNEE JOINT MANIPULATION Right 12/06/2015   LACRIMAL TUBE INSERTION Bilateral 03/01/2019   Procedure: LACRIMAL TUBE INSERTION;  Surgeon: Gevena Cotton, MD;  Location: [facility];  Service: Ophthalmology;  Laterality: Bilateral;   LACRIMAL TUBE REMOVAL Bilateral 05/03/2019   Procedure: BILATERAL NASOLACRIMAL DUCT PROBING, IIRIGATION AND TUBE REMOVAL BOTH EYES;  Surgeon: Gevena Cotton, MD;  Location: [place];  Service: Ophthalmology;  Laterality: Bilateral;   LUMBAR DISC SURGERY  06/03/2015   L 2  L3 L4 L5    LUMBAR LAMINECTOMY/DECOMPRESSION MICRODISCECTOMY N/A 06/03/2015   Procedure: Bilateral lateral recess decompression L2-3, L3-4, L4-5;  Surgeon: [name], MD;  Location: [place];  Service: Orthopedics;  Laterality: N/A;   LUMBAR LAMINECTOMY/DECOMPRESSION MICRODISCECTOMY N/A 10/02/2016   Procedure: Right L5-S1 Lateral Recess Decompression  microdiscectomy;  Surgeon: [name], MD;  Location: [place];  Service: Orthopedics;  Laterality: N/A;   LUMBAR LAMINECTOMY/DECOMPRESSION MICRODISCECTOMY N/A 07/15/2018   Procedure: LEFT L3-4 MICRODISCECTOMY;  Surgeon: [name], MD;  Location: [place];  Service: Orthopedics;  Laterality: N/A;   svd      x 2   TEAR DUCT PROBING Bilateral 03/01/2019   Procedure: TEAR DUCT PROBING WITH IRRIGATION;  Surgeon: Gevena Cotton, MD;  Location:  [facility];  Service: Ophthalmology;  Laterality: Bilateral;   TOTAL HIP ARTHROPLASTY Left 03/14/2019   Procedure: LEFT TOTAL HIP ARTHROPLASTY ANTERIOR APPROACH;  Surgeon: Mcarthur Rossetti, MD;  Location: [place];  Service: Orthopedics;  Laterality: Left;   TOTAL KNEE ARTHROPLASTY Right 09/06/2015   Procedure: RIGHT TOTAL KNEE ARTHROPLASTY;  Surgeon: [name], MD;  Location: [place];  Service: Orthopedics;  Laterality: Right;   TUBAL LIGATION     UPPER GASTROINTESTINAL ENDOSCOPY  04/28/11    OB History     Gravida  2   Para      Term      Preterm      AB      Living  2      SAB      IAB      Ectopic      Multiple      Live Births  2            Home Medications  Prior to Admission medications   Medication Sig Start Date End Date Taking? Authorizing Provider  cephALEXin (KEFLEX) 500 MG capsule Take 1 capsule (500 mg total) by mouth 4 (four) times daily for 5 days. 01/23/21 01/28/21 Yes Teodora Medici, FNP  ACCU-CHEK AVIVA PLUS test strip USE 3 TIMES A DAY AS DIRECTED BY PHYSICIAN 02/20/20   Elsie Stain, MD  Accu-Chek Softclix Lancets lancets USE AS INSTRUCTED 3 TIMES A DAY 07/09/20   [name], MD  albuterol (PROVENTIL) (2.5 MG/3ML) 0.083% nebulizer solution USE ONE VIAL (2.5 MG TOTAL) BY NEBULIZATION EVERY 6 (SIX) HOURS AS NEEDED FOR WHEEZING. 07/09/20   [name], MD  ALLERGY RELIEF 10 MG tablet TAKE 1 TABLET (10 MG TOTAL) BY MOUTH DAILY. (AM) 11/01/20   [name], MD  allopurinol (ZYLOPRIM) 100 MG tablet TAKE 1 TABLET (100 MG TOTAL) BY MOUTH DAILY.(AM) 10/01/20   [name], PA-C  atorvastatin (LIPITOR) 40 MG tablet Take 1 tablet (40 mg total) by mouth daily. 01/21/21   [name], MD  Blood Glucose Monitoring Suppl (ACCU-CHEK AVIVA PLUS) w/Device KIT 1 each by Does not apply route 3 (three) times daily. 10/20/17   Argentina Donovan, PA-C  budesonide-formoterol (SYMBICORT) 80-4.5 MCG/ACT inhaler INHALE TWO  PUFFS BY MOUTH TWICE A DAY 05/19/18   [name], MD  celecoxib (CELEBREX) 200 MG capsule TAKE 1 CAPSULE (200 MG TOTAL) BY MOUTH 2 (TWO) TIMES DAILY. (AM+BEDTIME) 11/01/20   [name], MD  clopidogrel (PLAVIX) 75 MG tablet TAKE 1 TABLET (75 MG TOTAL) BY MOUTH DAILY. (AM) 01/03/21   [name], MD  diclofenac Sodium (VOLTAREN) 1 % GEL APPLY 4 GRAMS TOPICALLY 4 (FOUR) TIMES DAILY. 04/25/20   [name], MD  dicyclomine (BENTYL) 10 MG capsule Tale one capsule 3 times daily    [provider]  DULoxetine (CYMBALTA) 60 MG capsule TAKE 1 CAPSULE (60 MG TOTAL) BY MOUTH DAILY (AM) 01/09/21   [name], MD  EPINEPHrine 0.3 mg/0.3 mL IJ SOAJ injection 0.3 mg IM x 1 PRN for allergic reaction Patient taking differently: Inject 0.3 mg into the muscle once as needed for anaphylaxis. 05/17/17   [name], MD  FLOWFLEX COVID-19 AG HOME TEST KIT See admin instructions. 03/21/20   [provider]  fluticasone (FLONASE) 50 MCG/ACT nasal spray Place 1 spray into both nostrils daily. 11/24/18   [name], MD  gabapentin (NEURONTIN) 600 MG tablet TAKE ONE CAPSULE BY MOUTH THREE TIMES A DAY (AM+NOON+BEDTIME) 06/25/20   Patel, Domenick Bookbinder, MD  glimepiride (AMARYL) 2 MG tablet TAKE 1 TABLET (2 MG TOTAL) BY MOUTH DAILY. (AM) 07/25/20   [name], MD  HYDROcodone-acetaminophen (NORCO/VICODIN) 5-325 MG tablet Take 1-2 tablets by mouth every 6 (six) hours as needed for moderate pain. 03/28/20 03/28/21  Pete Pelt, PA-C  hydrOXYzine (ATARAX/VISTARIL) 10 MG tablet TAKE 1 TABLET IN THE MORNING AND NOON AND 2 TABLETS IN THE EVENING AS NEEDED. 12/02/20   [name], MD  Iron, Ferrous Sulfate, 325 (65 Fe) MG TABS Take 325 mg by mouth daily. 01/21/21   [name], MD  Lancets (ACCU-CHEK SOFT TOUCH) lancets Use as instructed 10/20/17   Argentina Donovan, PA-C  loperamide (IMODIUM) 2 MG capsule TAKE 1 CAPSULE (2 MG TOTAL) BY MOUTH 4 (FOUR) TIMES DAILY AS  NEEDED FOR DIARRHEA OR LOOSE STOOLS. 06/14/20   [name], MD  loratadine (CLARITIN) 10 MG tablet Take 10  mg by mouth daily.    [provider]  meloxicam (MOBIC) 15 MG tablet TAKE 1 TABLET (15 MG TOTAL) BY MOUTH DAILY. (AM) 01/02/21   [name], MD  metFORMIN (GLUCOPHAGE-XR) 750 MG 24 hr tablet TAKE 1 TABLET (750 MG TOTAL) BY MOUTH 2 (TWO) TIMES DAILY. 11/01/20   [name], MD  methocarbamol (ROBAXIN) 750 MG tablet TAKE 1 TABLET (750 MG TOTAL) BY MOUTH EVERY 12 (TWELVE) HOURS AS NEEDED FOR MUSCLE SPASMS. 12/03/20   [name], MD  metoCLOPramide (REGLAN) 5 MG tablet Take 5 mg by mouth 3 (three) times daily as needed. Take 1 tablet by mouth three times a day before meals as needed for nausea and vomiting. 07/03/20   [provider]  metroNIDAZOLE (FLAGYL) 500 MG tablet Take 1 tablet (500 mg total) by mouth 2 (two) times daily. 01/06/21   [name], MD  montelukast (SINGULAIR) 10 MG tablet Take 10 mg by mouth at bedtime.  02/09/18   [provider]  Multiple Vitamins-Minerals (CENTRUM SILVER 50+WOMEN PO) Take by mouth.    [provider]  nicotine (NICODERM CQ - DOSED IN MG/24 HOURS) 14 mg/24hr patch PLACE 1 PATCH ONTO THE SKIN DAILY 12/03/20   [name], MD  pantoprazole (PROTONIX) 40 MG tablet Take 1 tablet (40 mg total) by mouth daily. 02/24/19   [name], MD  polyethylene glycol (MIRALAX / Floria Raveling) 17 g packet     [provider]  potassium chloride SA (KLOR-CON) 20 MEQ tablet TAKE ONE TABLET BY MOUTH ONCE DAILY (NOON) 12/02/20   [name], MD  Respiratory Therapy Supplies (FLUTTER) DEVI Use after breathing treatment 4 times daily 06/06/18   Elsie Stain, MD  SPIRIVA HANDIHALER 18 MCG inhalation capsule PLACE 1 CAPSULE (18 MCG TOTAL) INTO INHALER AND INHALE DAILY. 01/03/21   [name], MD  sucralfate (CARAFATE) 1 g tablet Take 1 g by mouth 4 (four) times daily -  with meals and at bedtime.     [provider]  triamcinolone cream (KENALOG) 0.1 % Apply 1 application topically 2 (two) times daily. 08/27/20   Mayers, Cari S, PA-C  valsartan-hydrochlorothiazide (DIOVAN-HCT) 160-12.5 MG tablet TAKE ONE TABLET BY MOUTH ONCE DAILY (AM) 01/03/21   [name], MD  Fluticasone-Salmeterol (ADVAIR) 500-50 MCG/DOSE AEPB Inhale 1 puff into the lungs every 12 (twelve) hours.    04/16/11  [provider]  lisinopril (PRINIVIL,ZESTRIL) 40 MG tablet Take 40 mg by mouth daily.    04/16/11  [provider]    Family History Family History  Problem Relation Age of Onset   Hypertension Father    Cancer Father    Heart disease Mother    Asthma Son        had as a child   Heart disease Sister    Breast cancer Sister    Hypertension Brother     Social History Social History   Tobacco Use   Smoking status: Some Days    Packs/day: 0.25    Years: 32.00    Pack years: 8.00    Types: Cigarettes   Smokeless tobacco: Never   Tobacco comments:    DOWN TO 3 PER DAY  Vaping Use   Vaping Use: Never used  Substance Use Topics   Alcohol use: No   Drug use: No     Allergies   Other, Shellfish allergy, Ace inhibitors, Celecoxib, Shellfish-derived products, and Aspirin   Review of Systems Review  of Systems Per HPI  Physical Exam Triage Vital Signs ED Triage Vitals [01/23/21 1629]  Enc Vitals Group     BP 130/72     Pulse Rate 81     Resp 16     Temp 98.5 F (36.9 C)     Temp Source Oral     SpO2 91 %     Weight      Height      Head Circumference      Peak Flow      Pain Score 0     Pain Loc      Pain Edu?      Excl. in [place]?    No data found.  Updated Vital Signs BP 130/72 (BP Location: Left Arm)    Pulse 81    Temp 98.5 F (36.9 C) (Oral)    Resp 16    LMP 09/01/2010    SpO2 95%   Visual Acuity Right Eye Distance: 20/70 Left Eye Distance: 20/50 Bilateral Distance: 20/50  Right Eye Near:   Left Eye Near:    Bilateral Near:      Physical Exam Constitutional:      General: She is not in acute distress.    Appearance: Normal appearance. She is not toxic-appearing or diaphoretic.  HENT:     Head: Normocephalic and atraumatic.  Eyes:     General: Lids are everted, no foreign bodies appreciated. Vision grossly intact. Gaze aligned appropriately.     Extraocular Movements: Extraocular movements intact.     Conjunctiva/sclera: Conjunctivae normal.     Pupils: Pupils are equal, round, and reactive to light.     Comments: Patient has raised approximately 1 cm in diameter lesion to right upper eyelid.  No drainage noted at this time.  No erythema noted.  No other additional eyelid swelling.  No pain with extraocular movements.  No discharge noted.  Pulmonary:     Effort: Pulmonary effort is normal.  Neurological:     General: No focal deficit present.     Mental Status: She is alert and oriented to person, place, and time. Mental status is at baseline.  Psychiatric:        Mood and Affect: Mood normal.        Behavior: Behavior normal.        Thought Content: Thought content normal.        Judgment: Judgment normal.     UC Treatments / Results  Labs (all labs ordered are listed, but only abnormal results are displayed) Labs Reviewed - No data to display  EKG   Radiology No results found.  Procedures Procedures (including critical care time)  Medications Ordered in UC Medications - No data to display  Initial Impression / Assessment and Plan / UC Course  I have reviewed the triage vital signs and the nursing notes.  Pertinent labs & imaging results that were available during my care of the patient were reviewed by me and considered in my medical decision making (see chart for details).     Patient has eyelid lesion to right upper eyelid that could be consistent with stye.  Advised patient to use warm compresses.  Will prescribe cephalexin antibiotic due to size of stye.  Patient will need to  follow-up with eye doctor for further evaluation and management.  Patient provided with contact information for eye doctor.  Visual acuity appears normal on exam.  Discussed return precautions.  Patient verbalized understanding and was agreeable plan. Final Clinical  Impressions(s) / UC Diagnoses   Final diagnoses:  Lesion of right upper eyelid     Discharge Instructions      Please follow-up with provided contact information for eye doctor as soon as possible for further evaluation and management.  You have been prescribed antibiotic to help treat infection.  Please also use warm compresses to affected area.    ED Prescriptions     Medication Sig Dispense Auth. Provider   cephALEXin (KEFLEX) 500 MG capsule Take 1 capsule (500 mg total) by mouth 4 (four) times daily for 5 days. 20 capsule Teodora Medici, [place]      PDMP not reviewed this encounter.   Teodora Medici, [place] 01/23/21 [phone]

## 2021-01-24 ENCOUNTER — Encounter: Payer: Self-pay | Admitting: Internal Medicine

## 2021-01-24 ENCOUNTER — Telehealth: Payer: Self-pay | Admitting: Internal Medicine

## 2021-01-24 DIAGNOSIS — H00011 Hordeolum externum right upper eyelid: Secondary | ICD-10-CM

## 2021-01-24 NOTE — Telephone Encounter (Signed)
Referral submitted to [facility].  Message sent to pt via Mychart.

## 2021-01-24 NOTE — Telephone Encounter (Signed)
Patient name and DOB verified.  Patient states she has been taken the measures recommended by Dr. Laural Benes already. This morning she woke up and it was larger.   Please refer to eye specialist.

## 2021-01-24 NOTE — Telephone Encounter (Signed)
Copied from CRM [id number]. Topic: Appointment Scheduling - Scheduling Inquiry for Clinic >> Jan 24, 2021  8:59 AM [name], Stacy Rumpf wrote: Reason for CRM: Pt stated she needs PCP to give her a referral for an eye doctor stated she was seen in ER for a sty in her eye was advice to go to [facility] Address: [address] [place], [place], [place] [address] Phone: [phone] pt stated they do not have appointments until January and she needs to be seen now. Pt stated [facility] advice her to go to [facility] Address: [address], [place], [place] [address] but they do not accept Medicaid.   Please advise .

## 2021-01-28 ENCOUNTER — Telehealth: Payer: Self-pay | Admitting: Physical Medicine and Rehabilitation

## 2021-01-28 NOTE — Telephone Encounter (Signed)
April (RN) from [facility] called for [place]. April is requesting clinical notes for a pre auth. Please fax notes to [phone].

## 2021-01-30 ENCOUNTER — Other Ambulatory Visit (INDEPENDENT_AMBULATORY_CARE_PROVIDER_SITE_OTHER): Payer: Self-pay | Admitting: Specialist

## 2021-01-30 ENCOUNTER — Other Ambulatory Visit: Payer: Self-pay | Admitting: Internal Medicine

## 2021-01-30 DIAGNOSIS — F17200 Nicotine dependence, unspecified, uncomplicated: Secondary | ICD-10-CM

## 2021-01-31 ENCOUNTER — Ambulatory Visit
Admission: RE | Admit: 2021-01-31 | Discharge: 2021-01-31 | Disposition: A | Discharge status: Home / Self Care | Payer: Medicaid Other | Source: Ambulatory Visit | Attending: Internal Medicine | Admitting: Internal Medicine

## 2021-01-31 DIAGNOSIS — Z1231 Encounter for screening mammogram for malignant neoplasm of breast: Secondary | ICD-10-CM

## 2021-02-04 ENCOUNTER — Encounter: Payer: Self-pay | Admitting: Internal Medicine

## 2021-02-04 DIAGNOSIS — H00021 Hordeolum internum right upper eyelid: Secondary | ICD-10-CM | POA: Diagnosis not present

## 2021-02-04 LAB — HM DIABETES EYE EXAM

## 2021-02-06 ENCOUNTER — Ambulatory Visit (INDEPENDENT_AMBULATORY_CARE_PROVIDER_SITE_OTHER): Payer: Medicaid Other | Admitting: Specialist

## 2021-02-06 ENCOUNTER — Encounter: Payer: Self-pay | Admitting: Specialist

## 2021-02-06 ENCOUNTER — Other Ambulatory Visit: Payer: Self-pay

## 2021-02-06 VITALS — BP 120/76 | HR 77 | Ht 66.0 in | Wt 164.0 lb

## 2021-02-06 DIAGNOSIS — M5417 Radiculopathy, lumbosacral region: Secondary | ICD-10-CM | POA: Diagnosis not present

## 2021-02-06 DIAGNOSIS — M51369 Other intervertebral disc degeneration, lumbar region without mention of lumbar back pain or lower extremity pain: Secondary | ICD-10-CM

## 2021-02-06 DIAGNOSIS — M4807 Spinal stenosis, lumbosacral region: Secondary | ICD-10-CM

## 2021-02-06 DIAGNOSIS — M48062 Spinal stenosis, lumbar region with neurogenic claudication: Secondary | ICD-10-CM | POA: Diagnosis not present

## 2021-02-06 DIAGNOSIS — M4156 Other secondary scoliosis, lumbar region: Secondary | ICD-10-CM | POA: Diagnosis not present

## 2021-02-06 DIAGNOSIS — M4316 Spondylolisthesis, lumbar region: Secondary | ICD-10-CM | POA: Diagnosis not present

## 2021-02-06 DIAGNOSIS — M5136 Other intervertebral disc degeneration, lumbar region: Secondary | ICD-10-CM

## 2021-02-06 DIAGNOSIS — M4726 Other spondylosis with radiculopathy, lumbar region: Secondary | ICD-10-CM

## 2021-02-06 MED ORDER — HYDROCODONE-ACETAMINOPHEN 5-325 MG PO TABS: 1.0000 | ORAL_TABLET | Freq: Four times a day (QID) | ORAL | 0 refills | Status: DC | PRN

## 2021-02-06 NOTE — Progress Notes (Addendum)
Office Visit Note   Patient: Stacy [name]           Date of Birth: Aug 02, 1960           MRN: [id number] Visit Date: 02/06/2021              Requested by: [name], MD [address] [place],  [place] [address] PCP: [name], MD   Assessment & Plan: Visit Diagnoses:  1. Spondylolisthesis, lumbar region   2. Spinal stenosis, lumbar region, with neurogenic claudication   3. Degenerative lumbar disc   4. Lumbosacral radiculopathy at L3   5. Spinal stenosis of lumbosacral region   6. Other secondary scoliosis, lumbar region   7. Other spondylosis with radiculopathy, lumbar region     Plan: Avoid bending, stooping and avoid lifting weights greater than 10 lbs. Avoid prolong standing and walking. Order for a new walker with wheels. Surgery scheduling secretary Kandice Hams, will call you in the next week to schedule for surgery.  Surgery recommended is a three level lumbar fusion L2-3, L3-4 and L4-5 this would be done with rods, screws and cages with local bone graft and allograft (donor bone graft). Take hydrocodone for for pain. Risk of surgery includes risk of infection 1 in 200 patients, bleeding 1/2% chance you would need a transfusion.   Risk to the nerves is one in 10,000. You will need to use a brace for 3 months and wean from the brace on the 4th month. Expect improved walking and standing tolerance. Expect relief of leg pain but numbness may persist depending on the length and degree of pressure that has been present.d  Follow-Up Instructions: Return in about 4 weeks (around 03/06/2021).   Orders:  No orders of the defined types were placed in this encounter.  No orders of the defined types were placed in this encounter.     Procedures: No procedures performed   Clinical Data: Findings:  Narrative & Impression CLINICAL DATA:  Patient complains of low back, left buttock, and leg pain with weakness for many years. Patient reports the  following surgeries in 2017: Bilateral hemilaminectomy L2-3, L3-4, L4-5, right L4-5 discectomy, and left L5-S1 discectomy. Patient denies history of therapeutic injections, changes to bowel or bladder or cancer .   EXAM: MRI LUMBAR SPINE WITHOUT CONTRAST   TECHNIQUE: Multiplanar, multisequence MR imaging of the lumbar spine was performed. No intravenous contrast was administered.   COMPARISON:  X-ray lumbar 07/29/2020; MR lumbar 08/30/2019.   FINDINGS: Segmentation:  Standard.   Alignment:  2 mm retrolisthesis of L3 on L4.   Vertebrae: No acute fracture, evidence of discitis, or aggressive bone lesion.   Conus medullaris and cauda equina: Conus extends to the T12-L1 level. Conus and cauda equina appear normal.   Paraspinal and other soft tissues: No acute paraspinal abnormality.   Disc levels:   Disc spaces: Degenerative disease with disc height loss at L2-3, L3-4, L4-5 and to lesser extent L5-S1.   T12-L1: No significant disc bulge. No neural foraminal stenosis. No central canal stenosis.   L1-L2: Minimal broad-based disc bulge. No foraminal or central canal stenosis.   L2-L3: Broad-based disc bulge. Mild bilateral facet arthropathy. Mild spinal stenosis. Mild bilateral foraminal stenosis.   L3-L4: Bilateral laminectomies. Broad-based disc bulge flattening ventral thecal sac. Mild bilateral facet arthropathy. Mild spinal stenosis. Moderate right and severe left foraminal stenosis.   L4-L5: Bilateral laminectomies. Broad-based disc bulge. Mild bilateral facet arthropathy. No spinal stenosis. Moderate bilateral foraminal  stenosis.   L5-S1: Bilateral laminectomies. Broad-based disc bulge. Moderate bilateral foraminal stenosis. No spinal stenosis.   IMPRESSION: 1. Lumbar spine spondylosis as described above. 2. No acute osseous injury of the lumbar spine.     Electronically Signed   By: Kathreen Devoid M.D.   On: 11/29/2020 11:55       Subjective: Chief  Complaint  Patient presents with   Lower Back - Follow-up    61 year old female with lower back and left leg greater than right pain. She reports that she is falling out And has fallen multiple times at least 30 times:her knees give away. She has a lumbar scoliosis with left foramenal stenosis L3-4 and L4-5 and a left lateral listhesis at L2-3. DDD with some mild foramenal stenosis L5-S1. No bowel and bladder difficulty. Severe left knee pain at night and when up and walking. She reports borrowing her mothers upright walker, I explained to Mrs. Hoffmeier that standing upright and extension of the spine causes the spinal canal to narrow and may be a cause of worsening of the leg weakness, using a regular walker and stooping or leaning may allow for the neuroforamen to open more and decrease some of her symptoms.   Review of Systems  Constitutional: Negative.   HENT: Negative.    Eyes: Negative.   Respiratory: Negative.    Cardiovascular: Negative.   Gastrointestinal: Negative.   Endocrine: Negative.   Genitourinary: Negative.   Musculoskeletal: Negative.   Skin: Negative.   Allergic/Immunologic: Negative.   Neurological: Negative.   Hematological: Negative.   Psychiatric/Behavioral: Negative.      Objective: Vital Signs: BP 120/76 (BP Location: Left Arm, Patient Position: Sitting)    Pulse 77    Ht 5\' 6"  (1.676 m)    Wt 164 lb (74.4 kg)    LMP 09/01/2010    BMI 26.47 kg/m   Physical Exam Constitutional:      Appearance: She is well-developed.  HENT:     Head: Normocephalic and atraumatic.  Eyes:     Pupils: Pupils are equal, round, and reactive to light.  Pulmonary:     Effort: Pulmonary effort is normal.     Breath sounds: Normal breath sounds.  Abdominal:     General: Bowel sounds are normal.     Palpations: Abdomen is soft.  Musculoskeletal:     Cervical back: Normal range of motion and neck supple.     Lumbar back: Negative right straight leg raise test and negative left  straight leg raise test.  Skin:    General: Skin is warm and dry.  Neurological:     Mental Status: She is alert and oriented to person, place, and time.  Psychiatric:        Behavior: Behavior normal.        Thought Content: Thought content normal.        Judgment: Judgment normal.   Back Exam   Tenderness  The patient is experiencing tenderness in the lumbar.  Range of Motion  Extension:  abnormal  Flexion:  abnormal  Lateral bend left:  abnormal  Rotation right:  abnormal  Rotation left:  abnormal   Muscle Strength  Right Quadriceps:  5/5  Left Quadriceps:  4/5  Right Hamstrings:  5/5  Left Hamstrings:  5/5   Tests  Straight leg raise right: negative Straight leg raise left: negative  Reflexes  Patellar:  0/4 Achilles:  2/4 Babinski's sign: normal   Other  Toe walk: normal Heel walk:  normal Sensation: normal Erythema: no back redness Scars: absent  Comments:  Left quad 4/5, left foot DF 5-/5    Specialty Comments:  No specialty comments available.  Imaging: No results found.   PMFS History: Patient Active Problem List   Diagnosis Date Noted   Herniation of lumbar intervertebral disc with radiculopathy 10/02/2016    Priority: High    Class: Chronic   Chondromalacia of both patellae 06/03/2015    Priority: High    Class: Chronic   Colon polyps 06/06/2020   Lupus ([place]) 06/06/2020   Allergic rhinitis due to animal (cat) (dog) hair and dander 04/08/2020   Allergic rhinitis due to pollen 04/08/2020   Food allergy 04/08/2020   Acute medial meniscus tear, left, subsequent encounter 03/21/2020   Chronic pain of left knee 02/15/2020   Paresthesia of skin 11/16/2019   History of total knee replacement, right 11/16/2019   Tobacco abuse 11/16/2019   Centrilobular emphysema ([place]) 08/10/2019   Incidental lung nodule, > 93mm and < 40mm 08/10/2019   OSA on CPAP 08/10/2019   Dyspnea on exertion 08/02/2019   Hyperlipidemia 08/02/2019   Lumbar radiculopathy  04/24/2019   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   Post laminectomy syndrome 02/23/2019   Abnormality of gait 02/23/2019   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   Unilateral primary osteoarthritis, left hip 12/28/2018   Lesion of skin of left ear 12/26/2018   Primary osteoarthritis of left hip 12/02/2018   Iron deficiency anemia 10/16/2018   Chronic pain syndrome 09/08/2018   Chronic pain of right knee 08/11/2018   Status post lumbar laminectomy 07/15/2018   Peripheral arterial disease ([place]) 04/05/2018   Moderate persistent asthma without complication [id number]   Environmental and seasonal allergies 06/29/2017   Controlled type 2 diabetes mellitus with diabetic polyneuropathy, without long-term current use of insulin ([place]) 06/29/2017   Perennial allergic rhinitis 04/08/2017   Sensorineural hearing loss (SNHL), bilateral 04/08/2017   Chronic pansinusitis 03/25/2017   Eustachian tube dysfunction, bilateral 0000000   Lichen planopilaris [id number]   Alopecia areata 08/19/2016   Spinal stenosis, lumbar region, with neurogenic claudication 06/03/2015   Tobacco use disorder 04/25/2015   DJD (degenerative joint disease) of knee 01/04/2015   Hemorrhoid 11/14/2014   Gout of big toe 07/19/2014   Essential hypertension 08/14/2013   Gastroesophageal reflux disease without esophagitis 08/14/2013   COPD (chronic obstructive pulmonary disease) ([place]) 04/17/2011   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   Asthma    COPD (chronic obstructive pulmonary disease) ([place])    Depression    Diabetes mellitus    Type II   GERD (gastroesophageal reflux disease)    Pt on Protonix daily   Glaucoma    Gout    Headache(784.0)    otc meds prn   Hyperlipidemia    Hypertension    Irritable bowel syndrome 11/19/2010   Neuropathy    Pneumonia YRS AGO   Restless legs     Shortness of breath    07/14/2018- uses  4 times a day    Family History  Problem Relation Age of Onset   Hypertension Father    Cancer Father    Heart disease Mother    Asthma Son        had as a child   Heart disease Sister    Breast cancer  Sister    Hypertension Brother     Past Surgical History:  Procedure Laterality Date   BACK SURGERY     CHOLECYSTECTOMY     COLONOSCOPY     ENDOMETRIAL ABLATION  10/2010   EYE SURGERY     HERNIA REPAIR     umbicial hernia   JOINT REPLACEMENT Left 03/14/2019   Dr. Ninfa Linden hip   KNEE ARTHROSCOPY Left    06/07/2017 Dr. Marlou Sa of [place] ARTHROSCOPY Left 03/21/2020   Procedure: LEFT KNEE ARTHROSCOPY WITH PARTIAL MEDIAL MENISCECTOMY;  Surgeon: Mcarthur Rossetti, MD;  Location: [facility];  Service: Orthopedics;  Laterality: Left;   KNEE CLOSED REDUCTION Right 12/06/2015   Procedure: CLOSED MANIPULATION RIGHT KNEE;  Surgeon: [name], MD;  Location: [facility];  Service: Orthopedics;  Laterality: Right;   KNEE CLOSED REDUCTION Right 01/17/2016   Procedure: CLOSED MANIPULATION RIGHT KNEE;  Surgeon: [name], MD;  Location: [facility];  Service: Orthopedics;  Laterality: Right;   KNEE JOINT MANIPULATION Right 12/06/2015   LACRIMAL TUBE INSERTION Bilateral 03/01/2019   Procedure: LACRIMAL TUBE INSERTION;  Surgeon: Gevena Cotton, MD;  Location[facility];  Service: Ophthalmology;  Laterality: Bilateral;   LACRIMAL TUBE REMOVAL Bilateral 05/03/2019   Procedure: BILATERAL NASOLACRIMAL DUCT PROBING, IIRIGATION AND TUBE REMOVAL BOTH EYES;  Surgeon: Gevena Cotton, MD;  Location: [facility] [facility];  Service: Ophthalmology;  Laterality: Bilateral;   LUMBAR DISC SURGERY  06/03/2015   L 2  L3 L4 L5    LUMBAR LAMINECTOMY/DECOMPRESSION MICRODISCECTOMY N/A 06/03/2015   Procedure: Bilateral lateral recess decompression L2-3, L3-4, L4-5;  Surgeon: [name], MD;  Location: [facility];  Service: Orthopedics;   Laterality: N/A;   LUMBAR LAMINECTOMY/DECOMPRESSION MICRODISCECTOMY N/A 10/02/2016   Procedure: Right L5-S1 Lateral Recess Decompression  microdiscectomy;  Surgeon: [name], MD;  Location: [facility];  Service: Orthopedics;  Laterality: N/A;   LUMBAR LAMINECTOMY/DECOMPRESSION MICRODISCECTOMY N/A 07/15/2018   Procedure: LEFT L3-4 MICRODISCECTOMY;  Surgeon: [name], MD;  Location: [place];  Service: Orthopedics;  Laterality: N/A;   svd      x 2   TEAR DUCT PROBING Bilateral 03/01/2019   Procedure: TEAR DUCT PROBING WITH IRRIGATION;  Surgeon: Gevena Cotton, MD;  Location: [facility];  Service: Ophthalmology;  Laterality: Bilateral;   TOTAL HIP ARTHROPLASTY Left 03/14/2019   Procedure: LEFT TOTAL HIP ARTHROPLASTY ANTERIOR APPROACH;  Surgeon: Mcarthur Rossetti, MD;  Location: [facility];  Service: Orthopedics;  Laterality: Left;   TOTAL KNEE ARTHROPLASTY Right 09/06/2015   Procedure: RIGHT TOTAL KNEE ARTHROPLASTY;  Surgeon: [name], MD;  Location: [facility];  Service: Orthopedics;  Laterality: Right;   TUBAL LIGATION     UPPER GASTROINTESTINAL ENDOSCOPY  04/28/11   Social History   Occupational History   Occupation: unemployed    Fish farm manager: UNEMPLOYED  Tobacco Use   Smoking status: Some Days    Packs/day: 0.25    Years: 32.00    Pack years: 8.00    Types: Cigarettes   Smokeless tobacco: Never   Tobacco comments:    DOWN TO 3 PER DAY  Vaping Use   Vaping Use: Never used  Substance and Sexual Activity   Alcohol use: No   Drug use: No   Sexual activity: Yes    Birth control/protection: Surgical, Post-menopausal    Comment: tubal ligation

## 2021-02-06 NOTE — Addendum Note (Signed)
Addended by: Basil Dess on: 02/06/2021 12:28 PM   Modules accepted: Orders

## 2021-02-06 NOTE — Patient Instructions (Signed)
Avoid bending, stooping and avoid lifting weights greater than 10 lbs. Avoid prolong standing and walking. Order for a new walker with wheels. Surgery scheduling secretary Tivis Ringer, will call you in the next week to schedule for surgery.  Surgery recommended is a three level lumbar fusion L2-3, L3-4 and L4-5 this would be done with rods, screws and cages with local bone graft and allograft (donor bone graft). Take hydrocodone for for pain. Risk of surgery includes risk of infection 1 in 200 patients, bleeding 1/2% chance you would need a transfusion.   Risk to the nerves is one in 10,000. You will need to use a brace for 3 months and wean from the brace on the 4th month. Expect improved walking and standing tolerance. Expect relief of leg pain but numbness may persist depending on the length and degree of pressure that has been present.

## 2021-02-07 ENCOUNTER — Ambulatory Visit (INDEPENDENT_AMBULATORY_CARE_PROVIDER_SITE_OTHER): Payer: Medicaid Other | Admitting: Podiatry

## 2021-02-07 DIAGNOSIS — M79676 Pain in unspecified toe(s): Secondary | ICD-10-CM | POA: Diagnosis not present

## 2021-02-07 DIAGNOSIS — E1151 Type 2 diabetes mellitus with diabetic peripheral angiopathy without gangrene: Secondary | ICD-10-CM

## 2021-02-07 DIAGNOSIS — B351 Tinea unguium: Secondary | ICD-10-CM | POA: Diagnosis not present

## 2021-02-11 ENCOUNTER — Encounter: Payer: Self-pay | Admitting: Podiatry

## 2021-02-11 NOTE — Progress Notes (Signed)
°  Subjective:  Patient ID: Stacy [name], female    DOB: 1960-09-18,  MRN: [id number]  61 y.o. female presents with at risk foot care. Pt has h/o NIDDM with PAD and painful elongated mycotic toenails 1-5 bilaterally which are tender when wearing enclosed shoe gear. Pain is relieved with periodic professional debridement.    Patient's blood sugar was 112 mg/dl today.   PCP: Marcine Matar, MD and last visit was: 01/20/2021. Patient states she is being worked up for back surgery. She recently obtained an upright rollator from [facility].  She voices no new pedal problems on today's visit.  Review of Systems: Negative except as noted in the HPI.   Allergies  Allergen Reactions   Other Shortness Of Breath    UNSPECIFIED AGENTS Allergic to perfumes and cleaning products   Shellfish Allergy Anaphylaxis    Per allergy test.   Ace Inhibitors Cough and Other (See Comments)        Celecoxib     Other reaction(s): upset stomach   Shellfish-Derived Products Other (See Comments)   Aspirin Nausea Only    Other reaction(s): stomach upset Other reaction(s): Unknown    Objective:  There were no vitals filed for this visit. Constitutional Patient is a pleasant 61 y.o. African American female WD, WN in NAD. AAO x 3.  Vascular Capillary fill time to digits <3 seconds b/l lower extremities. Faintly palpable DP pulse(/PT pulse(s) b/l lower extremities. Pedal hair absent. Lower extremity skin temperature gradient within normal limits. No pain with calf compression b/l. No cyanosis or clubbing noted. No ischemia or gangrene noted b/l LE.  Neurologic Normal speech. Protective sensation intact 5/5 intact bilaterally with 10g monofilament b/l. Vibratory sensation intact b/l.Pt has subjective symptoms of neuropathy.  Dermatologic Pedal skin is thin shiny, atrophic b/l lower extremities. No open wounds b/l lower extremities. No interdigital macerations b/l lower extremities. Toenails 1-5  b/l elongated, discolored, dystrophic, thickened, crumbly with subungual debris and tenderness to dorsal palpation.   Orthopedic: Normal muscle strength 5/5 to all lower extremity muscle groups bilaterally. HAV with bunion deformity noted b/l LE.   Hemoglobin A1C Latest Ref Rng & Units 01/20/2021 06/06/2020  HGBA1C 0.0 - 7.0 % 6.3 7.4(A)  Some recent data might be hidden   Assessment:   1. Pain due to onychomycosis of toenail   2. Type II diabetes mellitus with peripheral circulatory disorder [facility])    Plan:  Patient was evaluated and treated and all questions answered. Consent given for treatment as described below: -Examined patient. -Continue foot and shoe inspections daily. Monitor blood glucose per PCP/Endocrinologist's recommendations. -Mycotic toenails 1-5 bilaterally were debrided in length and girth with sterile nail nippers and dremel without incident. -Patient/POA to call should there be question/concern in the interim.  Return in about 3 months (around 05/08/2021).  [name], DPM

## 2021-02-12 DIAGNOSIS — J449 Chronic obstructive pulmonary disease, unspecified: Secondary | ICD-10-CM | POA: Diagnosis not present

## 2021-02-12 DIAGNOSIS — F331 Major depressive disorder, recurrent, moderate: Secondary | ICD-10-CM | POA: Diagnosis not present

## 2021-02-12 DIAGNOSIS — J301 Allergic rhinitis due to pollen: Secondary | ICD-10-CM | POA: Diagnosis not present

## 2021-02-12 DIAGNOSIS — J3089 Other allergic rhinitis: Secondary | ICD-10-CM | POA: Diagnosis not present

## 2021-02-12 DIAGNOSIS — J3081 Allergic rhinitis due to animal (cat) (dog) hair and dander: Secondary | ICD-10-CM | POA: Diagnosis not present

## 2021-02-18 ENCOUNTER — Ambulatory Visit (INDEPENDENT_AMBULATORY_CARE_PROVIDER_SITE_OTHER): Payer: Medicaid Other | Admitting: Physical Medicine and Rehabilitation

## 2021-02-18 ENCOUNTER — Encounter: Payer: Self-pay | Admitting: Physical Medicine and Rehabilitation

## 2021-02-18 ENCOUNTER — Ambulatory Visit: Payer: Self-pay

## 2021-02-18 ENCOUNTER — Other Ambulatory Visit: Payer: Self-pay

## 2021-02-18 VITALS — BP 116/76 | HR 87

## 2021-02-18 DIAGNOSIS — M5416 Radiculopathy, lumbar region: Secondary | ICD-10-CM | POA: Diagnosis not present

## 2021-02-18 DIAGNOSIS — M961 Postlaminectomy syndrome, not elsewhere classified: Secondary | ICD-10-CM | POA: Diagnosis not present

## 2021-02-18 MED ORDER — METHYLPREDNISOLONE ACETATE 80 MG/ML IJ SUSP
80.0000 mg | Freq: Once | INTRAMUSCULAR | Status: AC
  Administered 2021-02-18: 80 mg

## 2021-02-18 NOTE — Progress Notes (Signed)
Pt state lower back pain that travels down her left leg. Pt state walking and bending makes the pain worse. Pt state Pt state she takes pain meds and uses heat to help ease her pain.  Numeric Pain Rating Scale and Functional Assessment Average Pain 8   In the last MONTH (on 0-10 scale) has pain interfered with the following?  1. General activity like being  able to carry out your everyday physical activities such as walking, climbing stairs, carrying groceries, or moving a chair?  Rating(10)   +Driver, +BT, -Dye Allergies.

## 2021-02-18 NOTE — Patient Instructions (Signed)

## 2021-02-19 DIAGNOSIS — F331 Major depressive disorder, recurrent, moderate: Secondary | ICD-10-CM | POA: Diagnosis not present

## 2021-02-19 NOTE — Procedures (Signed)
Lumbosacral Transforaminal Epidural Steroid Injection - Sub-Pedicular Approach with Fluoroscopic Guidance  Patient: Stacy [name]      Date of Birth: [birth year] MRN: [id number] PCP: Marcine Matar, MD      Visit Date: 02/18/2021   Universal Protocol:    Date/Time: 02/18/2021  Consent Given By: the patient  Position: PRONE  Additional Comments: Vital signs were monitored before and after the procedure. Patient was prepped and draped in the usual sterile fashion. The correct patient, procedure, and site was verified.   Injection Procedure Details:   Procedure diagnoses: Lumbar radiculopathy [M54.16]    Meds Administered:  Meds ordered this encounter  Medications   methylPREDNISolone acetate (DEPO-MEDROL) injection 80 mg    Laterality: Left  Location/Site: L3 and L4  Needle:5.0 in., 22 ga.  Short bevel or Quincke spinal needle  Needle Placement: Transforaminal  Findings:    -Comments: Excellent flow of contrast along the nerve, nerve root and into the epidural space.  Procedure Details: After squaring off the end-plates to get a true AP view, the C-arm was positioned so that an oblique view of the foramen as noted above was visualized. The target area is just inferior to the "nose of the scotty dog" or sub pedicular. The soft tissues overlying this structure were infiltrated with 2-3 ml. of 1% Lidocaine without Epinephrine.  The spinal needle was inserted toward the target using a "trajectory" view along the fluoroscope beam.  Under AP and lateral visualization, the needle was advanced so it did not puncture dura and was located close the 6 O'Clock position of the pedical in AP tracterory. Biplanar projections were used to confirm position. Aspiration was confirmed to be negative for CSF and/or blood. A 1-2 ml. volume of Isovue-250 was injected and flow of contrast was noted at each level. Radiographs were obtained for documentation purposes.   After attaining  the desired flow of contrast documented above, a 0.5 to 1.0 ml test dose of 0.25% Marcaine was injected into each respective transforaminal space.  The patient was observed for 90 seconds post injection.  After no sensory deficits were reported, and normal lower extremity motor function was noted,   the above injectate was administered so that equal amounts of the injectate were placed at each foramen (level) into the transforaminal epidural space.   Additional Comments:  The patient tolerated the procedure well Dressing: 2 x 2 sterile gauze and Band-Aid    Post-procedure details: Patient was observed during the procedure. Post-procedure instructions were reviewed.  Patient left the clinic in stable condition.

## 2021-02-19 NOTE — Progress Notes (Signed)
Stacy [name] - 61 y.o. female MRN [id number]  Date of birth: 10-Aug-1960  Office Visit Note: Visit Date: 02/18/2021 PCP: Marcine MatarJohnson, Deborah B, MD Referred by: Marcine MatarJohnson, Deborah B, MD  Subjective: Chief Complaint  Patient presents with   Lower Back - Pain   Left Leg - Pain   HPI:  Stacy Pigeonamela Eleazer Martos is a 61 y.o. female who comes in today at the request of Dr. Vira BrownsJames Nitka for planned Left L3-4 and L4-5 Lumbar Transforaminal epidural steroid injection with fluoroscopic guidance.  The patient has failed conservative care including home exercise, medications, time and activity modification.  This injection will be diagnostic and hopefully therapeutic.  Please see requesting physician notes for further details and justification.  ROS Otherwise per HPI.  Assessment & Plan: Visit Diagnoses:    ICD-10-CM   1. Lumbar radiculopathy  M54.16 XR C-ARM NO REPORT    Epidural Steroid injection    methylPREDNISolone acetate (DEPO-MEDROL) injection 80 mg    2. Post laminectomy syndrome  M96.1 XR C-ARM NO REPORT    Epidural Steroid injection    methylPREDNISolone acetate (DEPO-MEDROL) injection 80 mg      Plan: No additional findings.   Meds & Orders:  Meds ordered this encounter  Medications   methylPREDNISolone acetate (DEPO-MEDROL) injection 80 mg    Orders Placed This Encounter  Procedures   XR C-ARM NO REPORT   Epidural Steroid injection    Follow-up: Return for visit to requesting provider as needed.   Procedures: No procedures performed  Lumbosacral Transforaminal Epidural Steroid Injection - Sub-Pedicular Approach with Fluoroscopic Guidance  Patient: Stacy Pigeonamela Eleazer Godinho      Date of Birth: 10-Aug-1960 MRN: [id number] PCP: Marcine MatarJohnson, Deborah B, MD      Visit Date: 02/18/2021   Universal Protocol:    Date/Time: 02/18/2021  Consent Given By: the patient  Position: PRONE  Additional Comments: Vital signs were monitored before and after the procedure. Patient  was prepped and draped in the usual sterile fashion. The correct patient, procedure, and site was verified.   Injection Procedure Details:   Procedure diagnoses: Lumbar radiculopathy [M54.16]    Meds Administered:  Meds ordered this encounter  Medications   methylPREDNISolone acetate (DEPO-MEDROL) injection 80 mg    Laterality: Left  Location/Site: L3 and L4  Needle:5.0 in., 22 ga.  Short bevel or Quincke spinal needle  Needle Placement: Transforaminal  Findings:    -Comments: Excellent flow of contrast along the nerve, nerve root and into the epidural space.  Procedure Details: After squaring off the end-plates to get a true AP view, the C-arm was positioned so that an oblique view of the foramen as noted above was visualized. The target area is just inferior to the "nose of the scotty dog" or sub pedicular. The soft tissues overlying this structure were infiltrated with 2-3 ml. of 1% Lidocaine without Epinephrine.  The spinal needle was inserted toward the target using a "trajectory" view along the fluoroscope beam.  Under AP and lateral visualization, the needle was advanced so it did not puncture dura and was located close the 6 O'Clock position of the pedical in AP tracterory. Biplanar projections were used to confirm position. Aspiration was confirmed to be negative for CSF and/or blood. A 1-2 ml. volume of Isovue-250 was injected and flow of contrast was noted at each level. Radiographs were obtained for documentation purposes.   After attaining the desired flow of contrast documented above, a 0.5 to 1.0 ml test dose of 0.25% Marcaine  was injected into each respective transforaminal space.  The patient was observed for 90 seconds post injection.  After no sensory deficits were reported, and normal lower extremity motor function was noted,   the above injectate was administered so that equal amounts of the injectate were placed at each foramen (level) into the transforaminal  epidural space.   Additional Comments:  The patient tolerated the procedure well Dressing: 2 x 2 sterile gauze and Band-Aid    Post-procedure details: Patient was observed during the procedure. Post-procedure instructions were reviewed.  Patient left the clinic in stable condition.    Clinical History: 09/17/20 EMG/NCS NCV & EMG Findings: Extensive electrodiagnostic testing of the right upper extremity and additional studies of the left shows:  1. Bilateral median, bilateral ulnar, and left mixed palmar sensory responses are within normal limits.  Right mixed, sensory response shows prolonged latency (Median Palm-Ulnar Palm, 0.9 ms).   2. Bilateral median and ulnar motor responses are within normal limits.   3. There is no evidence of active or chronic motor axonal loss changes affecting any of the tested muscles.  Motor unit configuration and recruitment pattern is within normal limits.     Impression: Right median neuropathy at or distal to the wrist (mild), consistent with a clinical diagnosis of carpal tunnel syndrome.       ___________________________ Nita Sickle, DO -- MRI IIMPRESSION: 1. 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 2. Prior laminectomy changes at the L3-4 through L5-S1 levels. No residual canal stenosis at these levels. 3. Similar degree of moderate bilateral foraminal stenosis at L4-5 and on the right at L5-S1.     Electronically Signed   By: Duanne Guess D.O.   On: 08/30/2019 17:02  ---- 09/30/17 EMG/NCS  Impression: Essentially NORMAL electrodiagnostic study of the right lower limb.  Nondiagnostic by criteria but some evidence of chronic very mild L3 or L4 radiculopathy.  Clinically she has more pain with movement and positioning of the leg that does not seem to fit with radiculopathy.   There is no significant electrodiagnostic evidence of nerve entrapment, lumbosacral plexopathy or  lumbar radiculopathy.     As you know, purely sensory or demyelinating radiculopathies and chemical radiculitis may not be detected with this particular electrodiagnostic study.   Recommendations: 1.  Follow-up with referring physician. 2.  Continue current management of symptoms.  Could consider radiofrequency ablation of the knee which is a procedure that has gained some ground over the last few years.  This can be tried with conventional radiofrequency ablation or if there is a practitioner in the area that uses cooled RF.   ___________________________ Naaman Plummer FAAPMR     Objective:  VS:  HT:     WT:    BMI:      BP:116/76   HR:87bpm   TEMP: ( )   RESP:  Physical Exam Vitals and nursing note reviewed.  Constitutional:      General: She is not in acute distress.    Appearance: Normal appearance. She is not ill-appearing.  HENT:     Head: Normocephalic and atraumatic.     Right Ear: External ear normal.     Left Ear: External ear normal.  Eyes:     Extraocular Movements: Extraocular movements intact.  Cardiovascular:     Rate and Rhythm: Normal rate.     Pulses: Normal pulses.  Pulmonary:     Effort: Pulmonary effort is normal. No respiratory distress.  Abdominal:  General: There is no distension.     Palpations: Abdomen is soft.  Musculoskeletal:        General: Tenderness present.     Cervical back: Neck supple.     Right lower leg: No edema.     Left lower leg: No edema.     Comments: Patient has good distal strength with no pain over the greater trochanters.  No clonus or focal weakness.  In wheelchair today.  Skin:    Findings: No erythema, lesion or rash.  Neurological:     General: No focal deficit present.     Mental Status: She is alert and oriented to person, place, and time.     Sensory: No sensory deficit.     Motor: No weakness or abnormal muscle tone.     Coordination: Coordination normal.  Psychiatric:        Mood and Affect: Mood normal.         Behavior: Behavior normal.     Imaging: XR C-ARM NO REPORT  Result Date: 02/18/2021 Please see Notes tab for imaging impression.

## 2021-02-26 DIAGNOSIS — F331 Major depressive disorder, recurrent, moderate: Secondary | ICD-10-CM | POA: Diagnosis not present

## 2021-03-03 ENCOUNTER — Other Ambulatory Visit: Payer: Self-pay | Admitting: Internal Medicine

## 2021-03-03 ENCOUNTER — Ambulatory Visit (HOSPITAL_COMMUNITY): Payer: Medicaid Other

## 2021-03-03 DIAGNOSIS — I10 Essential (primary) hypertension: Secondary | ICD-10-CM

## 2021-03-03 DIAGNOSIS — I739 Peripheral vascular disease, unspecified: Secondary | ICD-10-CM

## 2021-03-03 DIAGNOSIS — E1142 Type 2 diabetes mellitus with diabetic polyneuropathy: Secondary | ICD-10-CM

## 2021-03-03 DIAGNOSIS — J3089 Other allergic rhinitis: Secondary | ICD-10-CM

## 2021-03-03 DIAGNOSIS — E119 Type 2 diabetes mellitus without complications: Secondary | ICD-10-CM

## 2021-03-04 NOTE — Telephone Encounter (Signed)
Requested Prescriptions  Pending Prescriptions Disp Refills   glimepiride (AMARYL) 2 MG tablet [Pharmacy Med Name: GLIMEPIRIDE 2 MG ORAL TABLET] 90 tablet 1    Sig: TAKE 1 TABLET (2 MG TOTAL) BY MOUTH DAILY. (AM)     Endocrinology:  Diabetes - Sulfonylureas Passed - 03/03/2021 12:41 PM      Passed - HBA1C is between 0 and 7.9 and within 180 days    HbA1c, POC (prediabetic range)  Date Value Ref Range Status  08/10/2019 5.9 5.7 - 6.4 % Final   HbA1c, POC (controlled diabetic range)  Date Value Ref Range Status  01/20/2021 6.3 0.0 - 7.0 % Final         Passed - Valid encounter within last 6 months    Recent Outpatient Visits          1 month ago Preoperative evaluation to rule out surgical contraindication   [place] Karle Plumber B, MD   2 months ago Pap smear for cervical cancer screening   [place] Karle Plumber B, MD   9 months ago Type 2 diabetes mellitus with diabetic polyneuropathy, without long-term current use of insulin ([place])   [place], Deborah B, MD   1 year ago Type 2 diabetes mellitus with diabetic polyneuropathy, without long-term current use of insulin ([place])   [place] [name], MD   1 year ago Need for influenza vaccination   [place], RPH-CPP      Future Appointments            In 1 week [name], MD [facility]   In 2 weeks [name], PA-C [place] 10 MG tablet [Pharmacy Med Name: ALLERGY RELIEF 10 MG ORAL TABLET] 30 tablet 3    Sig: TAKE 1 TABLET (10 MG TOTAL) BY MOUTH DAILY. (AM)     Ear, Nose, and Throat:  Antihistamines Passed - 03/03/2021 12:41 PM      Passed - Valid encounter within last 12 months    Recent Outpatient Visits          1 month ago Preoperative evaluation  to rule out surgical contraindication   [place] Karle Plumber B, MD   2 months ago Pap smear for cervical cancer screening   [place] Karle Plumber B, MD   9 months ago Type 2 diabetes mellitus with diabetic polyneuropathy, without long-term current use of insulin ([place])   [place], Deborah B, MD   1 year ago Type 2 diabetes mellitus with diabetic polyneuropathy, without long-term current use of insulin ([place])   [place], MD   1 year ago Need for influenza vaccination   [place], RPH-CPP      Future Appointments            In 1 week [name], MD [facility]   In 2 weeks [name], PA-C [place]            hydrOXYzine (ATARAX) 10 MG tablet [Pharmacy Med Name: HYDROXYZINE HCL 10 MG ORAL TABLET] 120 tablet 2  Sig: TAKE 1 TABLET IN THE MORNING AND NOON AND 2 TABLETS IN THE EVENING AS NEEDED.     Ear, Nose, and Throat:  Antihistamines Passed - 03/03/2021 12:41 PM      Passed - Valid encounter within last 12 months    Recent Outpatient Visits          1 month ago Preoperative evaluation to rule out surgical contraindication   [place] [name], MD   2 months ago Pap smear for cervical cancer screening   [place] [name], MD   9 months ago Type 2 diabetes mellitus with diabetic polyneuropathy, without long-term current use of insulin ([place])   [place], Deborah B, MD   1 year ago Type 2 diabetes mellitus with diabetic polyneuropathy, without long-term current use of insulin ([place])   [place], Deborah B, MD   1 year ago Need for influenza vaccination   [place], RPH-CPP      Future Appointments            In 1 week [name], MD [place]   In 2 weeks [name], PA-C [place]            valsartan-hydrochlorothiazide (DIOVAN-HCT) 160-12.5 MG tablet [Pharmacy Med Name: VALSARTAN-HYDROCHLOROTHIAZIDE 160-12.5 MG ORAL TABLET] 30 tablet 2    Sig: TAKE ONE TABLET BY MOUTH ONCE DAILY (AM)     Cardiovascular: ARB + Diuretic Combos Passed - 03/03/2021 12:41 PM      Passed - K in normal range and within 180 days    Potassium  Date Value Ref Range Status  01/20/2021 4.2 3.5 - 5.2 mmol/L Final         Passed - Na in normal range and within 180 days    Sodium  Date Value Ref Range Status  01/20/2021 139 134 - 144 mmol/L Final         Passed - Cr in normal range and within 180 days    Creat  Date Value Ref Range Status  03/11/2016 0.90 0.50 - 1.05 mg/dL Final    Comment:      For patients > or = 62 years of age: The upper reference limit for Creatinine is approximately 13% higher for people identified as African-American.      Creatinine, Ser  Date Value Ref Range Status  01/20/2021 0.94 0.57 - 1.00 mg/dL Final   Creatinine, POC  Date Value Ref Range Status  06/10/2016 200 mg/dL Final         Passed - Ca in normal range and within 180 days    Calcium  Date Value Ref Range Status  01/20/2021 9.8 8.7 - 10.3 mg/dL Final   Calcium, Ion  Date Value Ref Range Status  03/01/2019 1.22 1.15 - 1.40 mmol/L Final         Passed - Patient is not pregnant      Passed - Last BP in normal range    BP Readings from Last 1 Encounters:  02/18/21 116/76         Passed - Valid encounter within last 6 months    Recent Outpatient Visits          1 month ago Preoperative evaluation to rule out surgical contraindication   [place]  [name], MD   2 months ago Pap smear for cervical cancer  screening   [place] Karle Plumber B, MD   9 months ago Type 2 diabetes mellitus with diabetic polyneuropathy, without long-term current use of insulin [facility])   [place], Deborah B, MD   1 year ago Type 2 diabetes mellitus with diabetic polyneuropathy, without long-term current use of insulin ([place])   [place], MD   1 year ago Need for influenza vaccination   [place], RPH-CPP      Future Appointments            In 1 week [name], MD [facility]   In 2 weeks [name], PA-C [place]            clopidogrel (PLAVIX) 75 MG tablet [Pharmacy Med Name: CLOPIDOGREL BISULFATE 75 MG ORAL TABLET] 30 tablet 2    Sig: TAKE 1 TABLET (75 MG TOTAL) BY MOUTH DAILY. (AM)     Hematology: Antiplatelets - clopidogrel Failed - 03/03/2021 12:41 PM      Failed - Evaluate AST, ALT within 2 months of therapy initiation.      Failed - HCT in normal range and within 180 days    Hematocrit  Date Value Ref Range Status  01/20/2021 32.6 (L) 34.0 - 46.6 % Final         Failed - HGB in normal range and within 180 days    Hemoglobin  Date Value Ref Range Status  01/20/2021 10.1 (L) 11.1 - 15.9 g/dL Final         Passed - ALT in normal range and within 360 days    ALT  Date Value Ref Range Status  01/20/2021 13 0 - 32 IU/L Final         Passed - AST in normal range and within 360 days    AST  Date Value Ref Range Status  01/20/2021 16 0 - 40 IU/L Final         Passed - PLT in normal range and within 180 days    Platelets  Date Value Ref Range Status  01/20/2021 393 150 - 450 x10E3/uL Final         Passed - Valid encounter within last 6 months    Recent Outpatient Visits          1 month ago Preoperative evaluation to rule out surgical contraindication   [place] Karle Plumber B, MD   2 months ago Pap smear for cervical cancer screening   [place] Karle Plumber B, MD   9 months ago Type 2 diabetes mellitus with diabetic polyneuropathy, without long-term current use of insulin ([place])   [place], Deborah B, MD   1 year ago Type 2 diabetes mellitus with diabetic polyneuropathy, without long-term current use of insulin ([place])   [place] [name], MD   1 year ago Need for influenza vaccination   [place], RPH-CPP      Future Appointments            In 1 week [name], MD [facility]   In 2 weeks Ricard Dillon,  Alyson Locket, PA-C [place]

## 2021-03-04 NOTE — Telephone Encounter (Signed)
Requested medications are due for refill today.  yes  Requested medications are on the active medications list.  yes  Last refill. 01/03/2021  Future visit scheduled.   no  Notes to clinic.  Failed protocol d/t abnormal labs.    Requested Prescriptions  Pending Prescriptions Disp Refills   clopidogrel (PLAVIX) 75 MG tablet [Pharmacy Med Name: CLOPIDOGREL BISULFATE 75 MG ORAL TABLET] 30 tablet 2    Sig: TAKE 1 TABLET (75 MG TOTAL) BY MOUTH DAILY. (AM)     Hematology: Antiplatelets - clopidogrel Failed - 03/03/2021 12:41 PM      Failed - Evaluate AST, ALT within 2 months of therapy initiation.      Failed - HCT in normal range and within 180 days    Hematocrit  Date Value Ref Range Status  01/20/2021 32.6 (L) 34.0 - 46.6 % Final          Failed - HGB in normal range and within 180 days    Hemoglobin  Date Value Ref Range Status  01/20/2021 10.1 (L) 11.1 - 15.9 g/dL Final          Passed - ALT in normal range and within 360 days    ALT  Date Value Ref Range Status  01/20/2021 13 0 - 32 IU/L Final          Passed - AST in normal range and within 360 days    AST  Date Value Ref Range Status  01/20/2021 16 0 - 40 IU/L Final          Passed - PLT in normal range and within 180 days    Platelets  Date Value Ref Range Status  01/20/2021 393 150 - 450 x10E3/uL Final          Passed - Valid encounter within last 6 months    Recent Outpatient Visits           1 month ago Preoperative evaluation to rule out surgical contraindication   [place] Karle Plumber B, MD   2 months ago Pap smear for cervical cancer screening   [place] Karle Plumber B, MD   9 months ago Type 2 diabetes mellitus with diabetic polyneuropathy, without long-term current use of insulin ([place])   [place], Deborah B, MD   1 year ago Type 2 diabetes mellitus with diabetic polyneuropathy,  without long-term current use of insulin ([place])   [place], MD   1 year ago Need for influenza vaccination   [place], RPH-CPP       Future Appointments             In 1 week [name], MD [facility]   In 2 weeks [name], PA-C [place]            Signed Prescriptions Disp Refills   glimepiride (AMARYL) 2 MG tablet 90 tablet 1    Sig: TAKE 1 TABLET (2 MG TOTAL) BY MOUTH DAILY. (AM)     Endocrinology:  Diabetes - Sulfonylureas Passed - 03/03/2021 12:41 PM      Passed - HBA1C is between 0 and 7.9 and within 180 days    HbA1c, POC (prediabetic range)  Date Value Ref Range Status  08/10/2019 5.9 5.7 - 6.4 % Final   HbA1c, POC (controlled diabetic  range)  Date Value Ref Range Status  01/20/2021 6.3 0.0 - 7.0 % Final          Passed - Valid encounter within last 6 months    Recent Outpatient Visits           1 month ago Preoperative evaluation to rule out surgical contraindication   [facility] Jonah Blue B, MD   2 months ago Pap smear for cervical cancer screening   [place] [facility] Jonah Blue B, MD   9 months ago Type 2 diabetes mellitus with diabetic polyneuropathy, without long-term current use of insulin (HCC)   [facility] Jonah Blue B, MD   1 year ago Type 2 diabetes mellitus with diabetic polyneuropathy, without long-term current use of insulin (HCC)   [place] [facility] Marcine Matar, MD   1 year ago Need for influenza vaccination   [facility] Lois Huxley, Cornelius Moras, RPH-CPP       Future Appointments             In 1 week Kerrin Champagne, MD [facility]   In 2 weeks Naida Sleight, PA-C [facility]              ALLERGY RELIEF 10 MG tablet 90 tablet 1    Sig: TAKE 1 TABLET (10 MG TOTAL) BY MOUTH DAILY. (AM)     Ear, Nose, and Throat:  Antihistamines Passed - 03/03/2021 12:41 PM      Passed - Valid encounter within last 12 months    Recent Outpatient Visits           1 month ago Preoperative evaluation to rule out surgical contraindication   [facility] Jonah Blue B, MD   2 months ago Pap smear for cervical cancer screening   [place] [facility] Jonah Blue B, MD   9 months ago Type 2 diabetes mellitus with diabetic polyneuropathy, without long-term current use of insulin (HCC)   [place] [facility] Jonah Blue B, MD   1 year ago Type 2 diabetes mellitus with diabetic polyneuropathy, without long-term current use of insulin (HCC)   [facility] Marcine Matar, MD   1 year ago Need for influenza vaccination   [facility] Lois Huxley, Cornelius Moras, RPH-CPP       Future Appointments             In 1 week Kerrin Champagne, MD [facility]   In 2 weeks Naida Sleight, PA-C [facility] [place]             hydrOXYzine (ATARAX) 10 MG tablet 120 tablet 2    Sig: TAKE 1 TABLET IN THE MORNING AND NOON AND 2 TABLETS IN THE EVENING AS NEEDED.     Ear, Nose, and Throat:  Antihistamines Passed - 03/03/2021 12:41 PM      Passed - Valid encounter within last 12 months    Recent Outpatient Visits           1 month ago Preoperative evaluation to rule out surgical contraindication   [facility] Marcine Matar, MD   2 months ago Pap smear for cervical cancer screening   [facility] Jonah Blue  B, MD   9 months ago Type 2 diabetes mellitus with diabetic polyneuropathy, without long-term current use of insulin ([place])   [place],  Deborah B, MD   1 year ago Type 2 diabetes mellitus with diabetic polyneuropathy, without long-term current use of insulin ([place])   [place] [name], MD   1 year ago Need for influenza vaccination   [place], RPH-CPP       Future Appointments             In 1 week [name], MD [facility]   In 2 weeks [name], PA-C [place]             valsartan-hydrochlorothiazide (DIOVAN-HCT) 160-12.5 MG tablet 90 tablet 1    Sig: TAKE ONE TABLET BY MOUTH ONCE DAILY (AM)     Cardiovascular: ARB + Diuretic Combos Passed - 03/03/2021 12:41 PM      Passed - K in normal range and within 180 days    Potassium  Date Value Ref Range Status  01/20/2021 4.2 3.5 - 5.2 mmol/L Final          Passed - Na in normal range and within 180 days    Sodium  Date Value Ref Range Status  01/20/2021 139 134 - 144 mmol/L Final          Passed - Cr in normal range and within 180 days    Creat  Date Value Ref Range Status  03/11/2016 0.90 0.50 - 1.05 mg/dL Final    Comment:      For patients > or = 62 years of age: The upper reference limit for Creatinine is approximately 13% higher for people identified as African-American.      Creatinine, Ser  Date Value Ref Range Status  01/20/2021 0.94 0.57 - 1.00 mg/dL Final   Creatinine, POC  Date Value Ref Range Status  06/10/2016 200 mg/dL Final          Passed - Ca in normal range and within 180 days    Calcium  Date Value Ref Range Status  01/20/2021 9.8 8.7 - 10.3 mg/dL Final   Calcium, Ion  Date Value Ref Range Status  03/01/2019 1.22 1.15 - 1.40 mmol/L Final          Passed - Patient is not pregnant      Passed - Last BP in normal range    BP Readings from Last 1 Encounters:  02/18/21 116/76          Passed - Valid encounter within last 6 months    Recent Outpatient Visits           1  month ago Preoperative evaluation to rule out surgical contraindication   [place] Karle Plumber B, MD   2 months ago Pap smear for cervical cancer screening   [place] Karle Plumber B, MD   9 months ago Type 2 diabetes mellitus with diabetic polyneuropathy, without long-term current use of insulin [facility])   [place] Karle Plumber B, MD   1 year ago Type 2 diabetes mellitus with diabetic polyneuropathy, without long-term current use of insulin [facility])   [place] [name], MD   1 year ago Need for influenza vaccination   [facility]  [place], RPH-CPP       Future Appointments             In 1 week [name] Daleen Bo, MD [facility]   In 2 weeks [name], PA-C [place]

## 2021-03-05 ENCOUNTER — Telehealth: Payer: Self-pay

## 2021-03-05 DIAGNOSIS — F331 Major depressive disorder, recurrent, moderate: Secondary | ICD-10-CM | POA: Diagnosis not present

## 2021-03-05 NOTE — Telephone Encounter (Signed)
He called back and stated that he now only needs the mattress and no longer the bed as he already has one

## 2021-03-05 NOTE — Telephone Encounter (Signed)
Patient called into the office stating that he needs a rx sent in for a potty chair and a hospital bed.   Please advise

## 2021-03-06 NOTE — Telephone Encounter (Signed)
I called and advised patient that per Dr. [name] he does not order those things that she would need to contact her PCP and see if they would order it for her

## 2021-03-07 ENCOUNTER — Telehealth: Payer: Self-pay | Admitting: Internal Medicine

## 2021-03-07 DIAGNOSIS — M48061 Spinal stenosis, lumbar region without neurogenic claudication: Secondary | ICD-10-CM

## 2021-03-07 NOTE — Telephone Encounter (Addendum)
Pt stated needs a potty chair and a mattress for hospital bed. Pt stated she was advised by ortho care to contact her PCP.   Surgery is 03/25/2021.    Pt requesting a call back.

## 2021-03-10 ENCOUNTER — Other Ambulatory Visit: Payer: Self-pay

## 2021-03-10 ENCOUNTER — Ambulatory Visit (HOSPITAL_COMMUNITY)
Admission: RE | Admit: 2021-03-10 | Discharge: 2021-03-10 | Disposition: A | Discharge status: Home / Self Care | Payer: Medicaid Other | Source: Ambulatory Visit | Attending: Internal Medicine | Admitting: Internal Medicine

## 2021-03-10 DIAGNOSIS — I700 Atherosclerosis of aorta: Secondary | ICD-10-CM | POA: Diagnosis not present

## 2021-03-10 DIAGNOSIS — J439 Emphysema, unspecified: Secondary | ICD-10-CM | POA: Diagnosis not present

## 2021-03-10 DIAGNOSIS — R918 Other nonspecific abnormal finding of lung field: Secondary | ICD-10-CM | POA: Diagnosis not present

## 2021-03-10 DIAGNOSIS — R911 Solitary pulmonary nodule: Secondary | ICD-10-CM | POA: Diagnosis not present

## 2021-03-10 NOTE — Telephone Encounter (Signed)
Will forward to provider  

## 2021-03-12 ENCOUNTER — Telehealth: Payer: Self-pay | Admitting: Internal Medicine

## 2021-03-12 DIAGNOSIS — F331 Major depressive disorder, recurrent, moderate: Secondary | ICD-10-CM | POA: Diagnosis not present

## 2021-03-12 NOTE — Telephone Encounter (Signed)
Phone call placed to patient today to go over the results of her CAT scan.  Patient informed that nodules seen in the lungs have remained unchanged compared to 1 year ago.  However I still stressed the importance of quitting smoking. CAT scan also showed changes consistent with COPD which we know she has. Also showed aortic atherosclerosis.  I explained the significance of this to her.  Patient is on statin therapy.  Patient wanted to let me know that she had her back surgery is scheduled for the 21st of this month.  She had called requesting a bedside commode and orthopedic mattress for her hospital bed.  Patient states that her orthopedic specialist had recommended the orthopedic mattress.  I told her that I did send in a prescription for the bedside commode but if the orthopedics is recommending the special mattress for her, he would need to order or give the prescription for it.  Patient expressed understanding.

## 2021-03-13 ENCOUNTER — Other Ambulatory Visit: Payer: Self-pay

## 2021-03-13 ENCOUNTER — Encounter: Payer: Self-pay | Admitting: Specialist

## 2021-03-13 ENCOUNTER — Other Ambulatory Visit (HOSPITAL_COMMUNITY)
Admission: RE | Admit: 2021-03-13 | Discharge: 2021-03-13 | Disposition: A | Discharge status: Home / Self Care | Payer: Medicaid Other | Source: Ambulatory Visit | Attending: Obstetrics & Gynecology | Admitting: Obstetrics & Gynecology

## 2021-03-13 ENCOUNTER — Encounter: Payer: Self-pay | Admitting: Obstetrics & Gynecology

## 2021-03-13 ENCOUNTER — Ambulatory Visit (INDEPENDENT_AMBULATORY_CARE_PROVIDER_SITE_OTHER): Payer: Medicaid Other | Admitting: Obstetrics & Gynecology

## 2021-03-13 ENCOUNTER — Ambulatory Visit (INDEPENDENT_AMBULATORY_CARE_PROVIDER_SITE_OTHER): Payer: Medicaid Other | Admitting: Specialist

## 2021-03-13 VITALS — BP 123/70 | HR 86 | Wt 156.3 lb

## 2021-03-13 VITALS — BP 119/75 | HR 84 | Ht 66.0 in | Wt 164.0 lb

## 2021-03-13 DIAGNOSIS — M4726 Other spondylosis with radiculopathy, lumbar region: Secondary | ICD-10-CM | POA: Diagnosis not present

## 2021-03-13 DIAGNOSIS — M4316 Spondylolisthesis, lumbar region: Secondary | ICD-10-CM

## 2021-03-13 DIAGNOSIS — N870 Mild cervical dysplasia: Secondary | ICD-10-CM | POA: Insufficient documentation

## 2021-03-13 DIAGNOSIS — M4156 Other secondary scoliosis, lumbar region: Secondary | ICD-10-CM | POA: Diagnosis not present

## 2021-03-13 DIAGNOSIS — N879 Dysplasia of cervix uteri, unspecified: Secondary | ICD-10-CM | POA: Diagnosis not present

## 2021-03-13 DIAGNOSIS — M5136 Other intervertebral disc degeneration, lumbar region: Secondary | ICD-10-CM | POA: Diagnosis not present

## 2021-03-13 DIAGNOSIS — R296 Repeated falls: Secondary | ICD-10-CM | POA: Diagnosis not present

## 2021-03-13 DIAGNOSIS — Z1151 Encounter for screening for human papillomavirus (HPV): Secondary | ICD-10-CM | POA: Insufficient documentation

## 2021-03-13 DIAGNOSIS — M48062 Spinal stenosis, lumbar region with neurogenic claudication: Secondary | ICD-10-CM | POA: Diagnosis not present

## 2021-03-13 DIAGNOSIS — B977 Papillomavirus as the cause of diseases classified elsewhere: Secondary | ICD-10-CM | POA: Insufficient documentation

## 2021-03-13 DIAGNOSIS — M51369 Other intervertebral disc degeneration, lumbar region without mention of lumbar back pain or lower extremity pain: Secondary | ICD-10-CM

## 2021-03-13 NOTE — Progress Notes (Signed)
Office Visit Note   Patient: Stacy [name]           Date of Birth: 09/08/1960           MRN: [id number] Visit Date: 03/13/2021              Requested by: [name], MD [address] [place],  [place] [address] PCP: [name], MD   Assessment & Plan: Visit Diagnoses: No diagnosis found.  Plan: Show images for MR LUMBAR SPINE WO CONTRAST Study Result  Narrative & Impression  CLINICAL DATA:  Patient complains of low back, left buttock, and leg pain with weakness for many years. Patient reports the following surgeries in 2017: Bilateral hemilaminectomy L2-3, L3-4, L4-5, right L4-5 discectomy, and left L5-S1 discectomy. Patient denies history of therapeutic injections, changes to bowel or bladder or cancer .   EXAM: MRI LUMBAR SPINE WITHOUT CONTRAST   TECHNIQUE: Multiplanar, multisequence MR imaging of the lumbar spine was performed. No intravenous contrast was administered.   COMPARISON:  X-ray lumbar 07/29/2020; MR lumbar 08/30/2019.   FINDINGS: Segmentation:  Standard.   Alignment:  2 mm retrolisthesis of L3 on L4.   Vertebrae: No acute fracture, evidence of discitis, or aggressive bone lesion.   Conus medullaris and cauda equina: Conus extends to the T12-L1 level. Conus and cauda equina appear normal.   Paraspinal and other soft tissues: No acute paraspinal abnormality.   Disc levels:   Disc spaces: Degenerative disease with disc height loss at L2-3, L3-4, L4-5 and to lesser extent L5-S1.   T12-L1: No significant disc bulge. No neural foraminal stenosis. No central canal stenosis.   L1-L2: Minimal broad-based disc bulge. No foraminal or central canal stenosis.   L2-L3: Broad-based disc bulge. Mild bilateral facet arthropathy. Mild spinal stenosis. Mild bilateral foraminal stenosis.   L3-L4: Bilateral laminectomies. Broad-based disc bulge flattening ventral thecal sac. Mild bilateral facet arthropathy. Mild spinal stenosis.  Moderate right and severe left foraminal stenosis.   L4-L5: Bilateral laminectomies. Broad-based disc bulge. Mild bilateral facet arthropathy. No spinal stenosis. Moderate bilateral foraminal stenosis.   L5-S1: Bilateral laminectomies. Broad-based disc bulge. Moderate bilateral foraminal stenosis. No spinal stenosis.   IMPRESSION: 1. Lumbar spine spondylosis as described above. 2. No acute osseous injury of the lumbar spine.     Electronically Signed   By: Kathreen Devoid M.D.   On: 11/29/2020 11:55    Follow-Up Instructions: No follow-ups on file.   Orders:  No orders of the defined types were placed in this encounter.  No orders of the defined types were placed in this encounter.     Procedures: No procedures performed   Clinical Data: Findings:  Show images for MR LUMBAR SPINE WO CONTRAST Study Result  Narrative & Impression CLINICAL DATA:  Patient complains of low back, left buttock, and leg pain with weakness for many years. Patient reports the following surgeries in 2017: Bilateral hemilaminectomy L2-3, L3-4, L4-5, right L4-5 discectomy, and left L5-S1 discectomy. Patient denies history of therapeutic injections, changes to bowel or bladder or cancer .  EXAM: MRI LUMBAR SPINE WITHOUT CONTRAST  TECHNIQUE: Multiplanar, multisequence MR imaging of the lumbar spine was performed. No intravenous contrast was administered.  COMPARISON:  X-ray lumbar 07/29/2020; MR lumbar 08/30/2019.  FINDINGS: Segmentation:  Standard.  Alignment:  2 mm retrolisthesis of L3 on L4.  Vertebrae: No acute fracture, evidence of discitis, or aggressive bone lesion.  Conus medullaris and cauda equina: Conus extends to the T12-L1 level. Conus and  cauda equina appear normal.  Paraspinal and other soft tissues: No acute paraspinal abnormality.  Disc levels:  Disc spaces: Degenerative disease with disc height loss at L2-3, L3-4, L4-5 and to lesser extent  L5-S1.  T12-L1: No significant disc bulge. No neural foraminal stenosis. No central canal stenosis.  L1-L2: Minimal broad-based disc bulge. No foraminal or central canal stenosis.  L2-L3: Broad-based disc bulge. Mild bilateral facet arthropathy. Mild spinal stenosis. Mild bilateral foraminal stenosis.  L3-L4: Bilateral laminectomies. Broad-based disc bulge flattening ventral thecal sac. Mild bilateral facet arthropathy. Mild spinal stenosis. Moderate right and severe left foraminal stenosis.  L4-L5: Bilateral laminectomies. Broad-based disc bulge. Mild bilateral facet arthropathy. No spinal stenosis. Moderate bilateral foraminal stenosis.  L5-S1: Bilateral laminectomies. Broad-based disc bulge. Moderate bilateral foraminal stenosis. No spinal stenosis.  IMPRESSION: 1. Lumbar spine spondylosis as described above. 2. No acute osseous injury of the lumbar spine.   Electronically Signed   By: Kathreen Devoid M.D.   On: 11/29/2020 11:55    Subjective: Chief Complaint  Patient presents with   Lower Back - Follow-up    States that her back has given out 7-8 times since her injection with Dr. Lynnell Grain Lt L3 & L4 TF on 02/18/21, she is scheduled for 03/25/21    61 year old female with history of right TKR for severe P-F arthritis, also has had left knee pain and lumbar spondylosis and spinal stenosis. Had two lumbar decompression laminectomy and is having persitent left knee pain and difficulty with walking. Notes that she has fallen twice since last visit. Epidural injections left L3 and L4 transforamenal did not seem to improve her situation and she relates it did not relieve her pain.    Review of Systems  Constitutional: Negative.   HENT: Negative.    Eyes: Negative.   Respiratory: Negative.    Cardiovascular: Negative.   Gastrointestinal: Negative.   Endocrine: Negative.   Genitourinary: Negative.   Musculoskeletal: Negative.   Skin: Negative.    Allergic/Immunologic: Negative.   Neurological: Negative.   Hematological: Negative.   Psychiatric/Behavioral: Negative.      Objective: Vital Signs: BP 119/75 (BP Location: Left Arm, Patient Position: Sitting)    Pulse 84    Ht 5\' 6"  (1.676 m)    Wt 164 lb (74.4 kg)    LMP 09/01/2010    BMI 26.47 kg/m   Physical Exam Constitutional:      Appearance: She is well-developed.  HENT:     Head: Normocephalic and atraumatic.  Eyes:     Pupils: Pupils are equal, round, and reactive to light.  Pulmonary:     Effort: Pulmonary effort is normal.     Breath sounds: Normal breath sounds.  Abdominal:     General: Bowel sounds are normal.     Palpations: Abdomen is soft.  Musculoskeletal:     Cervical back: Normal range of motion and neck supple.  Skin:    General: Skin is warm and dry.  Neurological:     Mental Status: She is alert and oriented to person, place, and time.  Psychiatric:        Behavior: Behavior normal.        Thought Content: Thought content normal.        Judgment: Judgment normal.    Back Exam   Tenderness  The patient is experiencing tenderness in the lumbar.  Range of Motion  Extension:  abnormal  Flexion:  abnormal  Lateral bend right:  abnormal  Lateral bend left:  abnormal  Rotation right:  abnormal  Rotation left:  abnormal   Muscle Strength  Right Quadriceps:  5/5  Left Quadriceps:  4/5  Right Hamstrings:  5/5  Left Hamstrings:  5/5   Reflexes  Patellar:  0/4 Achilles:  2/4     Specialty Comments:  No specialty comments available.  Imaging: No results found.   PMFS History: Patient Active Problem List   Diagnosis Date Noted   Herniation of lumbar intervertebral disc with radiculopathy 10/02/2016    Priority: High    Class: Chronic   Chondromalacia of both patellae 06/03/2015    Priority: High    Class: Chronic   Colon polyps 06/06/2020   Lupus ([place]) 06/06/2020   Allergic rhinitis due to animal (cat) (dog) hair and  dander 04/08/2020   Allergic rhinitis due to pollen 04/08/2020   Food allergy 04/08/2020   Acute medial meniscus tear, left, subsequent encounter 03/21/2020   Chronic pain of left knee 02/15/2020   Paresthesia of skin 11/16/2019   History of total knee replacement, right 11/16/2019   Tobacco abuse 11/16/2019   Centrilobular emphysema ([place]) 08/10/2019   Incidental lung nodule, > 19mm and < 58mm 08/10/2019   OSA on CPAP 08/10/2019   Dyspnea on exertion 08/02/2019   Hyperlipidemia 08/02/2019   Lumbar radiculopathy 04/24/2019   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   Post laminectomy syndrome 02/23/2019   Abnormality of gait 02/23/2019   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   Unilateral primary osteoarthritis, left hip 12/28/2018   Lesion of skin of left ear 12/26/2018   Primary osteoarthritis of left hip 12/02/2018   Iron deficiency anemia 10/16/2018   Chronic pain syndrome 09/08/2018   Chronic pain of right knee 08/11/2018   Status post lumbar laminectomy 07/15/2018   Peripheral arterial disease ([place]) 04/05/2018   Moderate persistent asthma without complication 123456   Environmental and seasonal allergies 06/29/2017   Controlled type 2 diabetes mellitus with diabetic polyneuropathy, without long-term current use of insulin ([place]) 06/29/2017   Perennial allergic rhinitis 04/08/2017   Sensorineural hearing loss (SNHL), bilateral 04/08/2017   Chronic pansinusitis 03/25/2017   Eustachian tube dysfunction, bilateral 0000000   Lichen planopilaris [id number]   Alopecia areata 08/19/2016   Spinal stenosis, lumbar region, with neurogenic claudication 06/03/2015   Tobacco use disorder 04/25/2015   DJD (degenerative joint disease) of knee 01/04/2015   Hemorrhoid 11/14/2014   Gout of big toe 07/19/2014   Essential hypertension 08/14/2013   Gastroesophageal reflux disease without esophagitis 08/14/2013   COPD (chronic obstructive pulmonary  disease) ([place]) 04/17/2011   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   Asthma    COPD (chronic obstructive pulmonary disease) ([place])    Depression    Diabetes mellitus    Type II   GERD (gastroesophageal reflux disease)    Pt on Protonix daily   Glaucoma    Gout    Headache(784.0)    otc meds prn   Hyperlipidemia    Hypertension    Irritable bowel syndrome 11/19/2010   Neuropathy    Pneumonia YRS AGO   Restless legs    Shortness of breath    07/14/2018- uses  4 times a day    Family History  Problem Relation Age of Onset   Hypertension Father    Cancer Father    Heart disease  Mother    Asthma Son        had as a child   Heart disease Sister    Breast cancer Sister    Hypertension Brother     Past Surgical History:  Procedure Laterality Date   BACK SURGERY     CHOLECYSTECTOMY     COLONOSCOPY     ENDOMETRIAL ABLATION  10/2010   EYE SURGERY     HERNIA REPAIR     umbicial hernia   JOINT REPLACEMENT Left 03/14/2019   Dr. Magnus Ivan hip   KNEE ARTHROSCOPY Left    06/07/2017 Dr. August [facility]   KNEE ARTHROSCOPY Left 03/21/2020   Procedure: LEFT KNEE ARTHROSCOPY WITH PARTIAL MEDIAL MENISCECTOMY;  Surgeon: [name], MD;  Location: [facility];  Service: Orthopedics;  Laterality: Left;   KNEE CLOSED REDUCTION Right 12/06/2015   Procedure: CLOSED MANIPULATION RIGHT KNEE;  Surgeon: Kerrin Champagne, MD;  Location: [facility];  Service: Orthopedics;  Laterality: Right;   KNEE CLOSED REDUCTION Right 01/17/2016   Procedure: CLOSED MANIPULATION RIGHT KNEE;  Surgeon: Kerrin Champagne, MD;  Location: [facility];  Service: Orthopedics;  Laterality: Right;   KNEE JOINT MANIPULATION Right 12/06/2015   LACRIMAL TUBE INSERTION Bilateral 03/01/2019   Procedure: LACRIMAL TUBE INSERTION;  Surgeon: Aura Camps, MD;  Location: [facility];  Service: Ophthalmology;  Laterality: Bilateral;   LACRIMAL TUBE REMOVAL Bilateral 05/03/2019   Procedure: BILATERAL NASOLACRIMAL DUCT PROBING, IIRIGATION AND TUBE REMOVAL BOTH EYES;  Surgeon: Aura Camps, MD;  Location: [facility];  Service: Ophthalmology;  Laterality: Bilateral;   LUMBAR DISC SURGERY  06/03/2015   L 2  L3 L4 L5    LUMBAR LAMINECTOMY/DECOMPRESSION MICRODISCECTOMY N/A 06/03/2015   Procedure: Bilateral lateral recess decompression L2-3, L3-4, L4-5;  Surgeon: Kerrin Champagne, MD;  Location: [facility];  Service: Orthopedics;  Laterality: N/A;   LUMBAR LAMINECTOMY/DECOMPRESSION MICRODISCECTOMY N/A 10/02/2016   Procedure: Right L5-S1 Lateral Recess Decompression  microdiscectomy;  Surgeon: Kerrin Champagne, MD;  Location: [facility];  Service: Orthopedics;  Laterality: N/A;   LUMBAR LAMINECTOMY/DECOMPRESSION MICRODISCECTOMY N/A 07/15/2018   Procedure: LEFT L3-4 MICRODISCECTOMY;  Surgeon: Kerrin Champagne, MD;  [facility];  Service: Orthopedics;  Laterality: N/A;   svd      x 2   TEAR DUCT PROBING Bilateral 03/01/2019   Procedure: TEAR DUCT PROBING WITH IRRIGATION;  Surgeon: Aura Camps, MD;  Location: [facility];  Service: Ophthalmology;  Laterality: Bilateral;   TOTAL HIP ARTHROPLASTY Left 03/14/2019   Procedure: LEFT TOTAL HIP ARTHROPLASTY ANTERIOR APPROACH;  Surgeon: [name], MD;  Location: [facility];  Service: Orthopedics;  Laterality: Left;   TOTAL KNEE ARTHROPLASTY Right 09/06/2015   Procedure: RIGHT TOTAL KNEE ARTHROPLASTY;  Surgeon: Kerrin Champagne, MD;  Location: [facility];  Service: Orthopedics;  Laterality: Right;   TUBAL LIGATION     UPPER GASTROINTESTINAL ENDOSCOPY  04/28/11   Social History   Occupational History   Occupation: unemployed    Associate Professor: UNEMPLOYED  Tobacco Use   Smoking status: Some Days    Packs/day: 0.25    Years: 32.00    Pack years: 8.00    Types:  Cigarettes   Smokeless tobacco: Never   Tobacco comments:    DOWN TO 3 PER DAY  Vaping Use   Vaping Use: Never used  Substance and Sexual Activity   Alcohol use: No   Drug use: No   Sexual activity: Yes  Birth control/protection: Surgical, Post-menopausal    Comment: tubal ligation

## 2021-03-13 NOTE — Patient Instructions (Signed)
Plan: Avoid bending, stooping and avoid lifting weights greater than 10 lbs. Avoid prolong standing and walking. Order for a new walker with wheels. Surgery scheduling secretary Tivis Ringer, will call you in the next week to schedule for surgery.  Surgery recommended is a three level lumbar fusion L2-3, L3-4 and L4-5 this would be done with rods, screws and cages with local bone graft and allograft (donor bone graft). Take hydrocodone for for pain. Risk of surgery includes risk of infection 1 in 200 patients, bleeding 1/2% chance you would need a transfusion.   Risk to the nerves is one in 10,000. You will need to use a brace for 3 months and wean from the brace on the 4th month. Expect improved walking and standing tolerance. Expect relief of leg pain but numbness may persist depending on the length and degree of pressure that has been present.

## 2021-03-13 NOTE — Progress Notes (Signed)
Patient ID: Stacy [name], female   DOB: [birth year], 61 y.o.   MRN: [id number]  Chief Complaint  Patient presents with   Colposcopy    HPI Stacy [name] is a 61 y.o. female.  G2P2 Postmenopausal HPI  Indications: Pap smear on December 2022 showed: low-grade squamous intraepithelial neoplasia (LGSIL - encompassing HPV,mild dysplasia,CIN I). Previous colposcopy: normal exam without visible pathology and in 01/2019. Prior cervical treatment: no treatment. LSIL CIN1 pos HR HPV  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   Asthma    COPD (chronic obstructive pulmonary disease) (HCC)    Depression    Diabetes mellitus    Type II   GERD (gastroesophageal reflux disease)    Pt on Protonix daily   Glaucoma    Gout    Headache(784.0)    otc meds prn   Hyperlipidemia    Hypertension    Irritable bowel syndrome 11/19/2010   Neuropathy    Pneumonia YRS AGO   Restless legs    Shortness of breath    07/14/2018- uses  4 times a day    Past Surgical History:  Procedure Laterality Date   BACK SURGERY     CHOLECYSTECTOMY     COLONOSCOPY     ENDOMETRIAL ABLATION  10/2010   EYE SURGERY     HERNIA REPAIR     umbicial hernia   JOINT REPLACEMENT Left 03/14/2019   Dr. Ninfa Linden hip   KNEE ARTHROSCOPY Left    06/07/2017 Dr. Marlou Sa of [place] ARTHROSCOPY Left 03/21/2020   Procedure: LEFT KNEE ARTHROSCOPY WITH PARTIAL MEDIAL MENISCECTOMY;  Surgeon: Mcarthur Rossetti, MD;  Location: [place];  Service: Orthopedics;  Laterality: Left;   KNEE CLOSED REDUCTION Right 12/06/2015   Procedure: CLOSED MANIPULATION RIGHT KNEE;  Surgeon: [name], MD;  Location: [place];  Service: Orthopedics;  Laterality: Right;   KNEE CLOSED REDUCTION Right 01/17/2016   Procedure: CLOSED MANIPULATION RIGHT KNEE;  Surgeon: [name], MD;   Location: [place];  Service: Orthopedics;  Laterality: Right;   KNEE JOINT MANIPULATION Right 12/06/2015   LACRIMAL TUBE INSERTION Bilateral 03/01/2019   Procedure: LACRIMAL TUBE INSERTION;  Surgeon: Gevena Cotton, MD;  Location: [facility];  Service: Ophthalmology;  Laterality: Bilateral;   LACRIMAL TUBE REMOVAL Bilateral 05/03/2019   Procedure: BILATERAL NASOLACRIMAL DUCT PROBING, IIRIGATION AND TUBE REMOVAL BOTH EYES;  Surgeon: Gevena Cotton, MD;  Location: [place];  Service: Ophthalmology;  Laterality: Bilateral;   LUMBAR DISC SURGERY  06/03/2015   L 2  L3 L4 L5    LUMBAR LAMINECTOMY/DECOMPRESSION MICRODISCECTOMY N/A 06/03/2015   Procedure: Bilateral lateral recess decompression L2-3, L3-4, L4-5;  Surgeon: [name], MD;  Location: [place];  Service: Orthopedics;  Laterality: N/A;   LUMBAR LAMINECTOMY/DECOMPRESSION MICRODISCECTOMY N/A 10/02/2016   Procedure: Right L5-S1 Lateral Recess Decompression  microdiscectomy;  Surgeon: [name], MD;  Location: [place];  Service: Orthopedics;  Laterality: N/A;   LUMBAR LAMINECTOMY/DECOMPRESSION MICRODISCECTOMY N/A 07/15/2018   Procedure: LEFT L3-4 MICRODISCECTOMY;  Surgeon: [name], MD;  Location: [place];  Service: Orthopedics;  Laterality: N/A;   svd      x 2   TEAR DUCT PROBING Bilateral 03/01/2019   Procedure: TEAR DUCT PROBING WITH IRRIGATION;  Surgeon: Gevena Cotton, MD;  Location:  [place];  Service: Ophthalmology;  Laterality: Bilateral;   TOTAL HIP ARTHROPLASTY Left 03/14/2019   Procedure: LEFT TOTAL HIP ARTHROPLASTY ANTERIOR APPROACH;  Surgeon: Mcarthur Rossetti, MD;  Location: [place];  Service: Orthopedics;  Laterality: Left;   TOTAL KNEE ARTHROPLASTY Right 09/06/2015   Procedure: RIGHT TOTAL KNEE ARTHROPLASTY;  Surgeon: [name], MD;  Location: [place];  Service: Orthopedics;  Laterality: Right;   TUBAL LIGATION     UPPER GASTROINTESTINAL ENDOSCOPY  04/28/11    Family  History  Problem Relation Age of Onset   Hypertension Father    Cancer Father    Heart disease Mother    Asthma Son        had as a child   Heart disease Sister    Breast cancer Sister    Hypertension Brother     Social History Social History   Tobacco Use   Smoking status: Some Days    Packs/day: 0.25    Years: 32.00    Pack years: 8.00    Types: Cigarettes   Smokeless tobacco: Never   Tobacco comments:    DOWN TO 3 PER DAY  Vaping Use   Vaping Use: Never used  Substance Use Topics   Alcohol use: No   Drug use: No    Allergies  Allergen Reactions   Other Shortness Of Breath    UNSPECIFIED AGENTS Allergic to perfumes and cleaning products   Shellfish Allergy Anaphylaxis    Per allergy test.   Ace Inhibitors Cough and Other (See Comments)        Celecoxib     Other reaction(s): upset stomach   Shellfish-Derived Products Other (See Comments)   Aspirin Nausea Only    Other reaction(s): stomach upset Other reaction(s): Unknown    Current Outpatient Medications  Medication Sig Dispense Refill   ACCU-CHEK AVIVA PLUS test strip USE 3 TIMES A DAY AS DIRECTED BY PHYSICIAN 100 each 12   Accu-Chek Softclix Lancets lancets USE AS INSTRUCTED 3 TIMES A DAY 100 each 12   albuterol (PROVENTIL) (2.5 MG/3ML) 0.083% nebulizer solution USE ONE VIAL (2.5 MG TOTAL) BY NEBULIZATION EVERY 6 (SIX) HOURS AS NEEDED FOR WHEEZING. 300 mL 0   ALLERGY RELIEF 10 MG tablet TAKE 1 TABLET (10 MG TOTAL) BY MOUTH DAILY. (AM) 90 tablet 1   allopurinol (ZYLOPRIM) 100 MG tablet TAKE 1 TABLET (100 MG TOTAL) BY MOUTH DAILY.(AM) 90 tablet 3   atorvastatin (LIPITOR) 40 MG tablet Take 1 tablet (40 mg total) by mouth daily. 30 tablet 4   Blood Glucose Monitoring Suppl (ACCU-CHEK AVIVA PLUS) w/Device KIT 1 each by Does not apply route 3 (three) times daily. 1 kit 0   budesonide-formoterol (SYMBICORT) 80-4.5 MCG/ACT inhaler INHALE TWO PUFFS BY MOUTH TWICE A DAY 1 Inhaler 11   celecoxib (CELEBREX) 200 MG  capsule TAKE 1 CAPSULE (200 MG TOTAL) BY MOUTH 2 (TWO) TIMES DAILY. (AM+BEDTIME) 180 capsule 3   clopidogrel (PLAVIX) 75 MG tablet TAKE 1 TABLET (75 MG TOTAL) BY MOUTH DAILY. (AM) 30 tablet 2   diclofenac Sodium (VOLTAREN) 1 % GEL APPLY 4 GRAMS TOPICALLY 4 (FOUR) TIMES DAILY. 400 g 6   dicyclomine (BENTYL) 10 MG capsule Tale one capsule 3 times daily     DULoxetine (CYMBALTA) 60 MG capsule TAKE 1 CAPSULE (60 MG TOTAL) BY MOUTH DAILY (AM) 30 capsule 3   EPINEPHrine 0.3 mg/0.3 mL IJ SOAJ injection 0.3 mg IM x 1 PRN for allergic reaction (Patient taking differently: Inject  0.3 mg into the muscle once as needed for anaphylaxis.) 1 Device 1   fluticasone (FLONASE) 50 MCG/ACT nasal spray Place 1 spray into both nostrils daily. 16 g 6   gabapentin (NEURONTIN) 600 MG tablet TAKE ONE CAPSULE BY MOUTH THREE TIMES A DAY (AM+NOON+BEDTIME) 90 tablet 1   glimepiride (AMARYL) 2 MG tablet TAKE 1 TABLET (2 MG TOTAL) BY MOUTH DAILY. (AM) 90 tablet 1   HYDROcodone-acetaminophen (NORCO/VICODIN) 5-325 MG tablet Take 1-2 tablets by mouth every 6 (six) hours as needed for moderate pain. 30 tablet 0   hydrOXYzine (ATARAX) 10 MG tablet TAKE 1 TABLET IN THE MORNING AND NOON AND 2 TABLETS IN THE EVENING AS NEEDED. 120 tablet 2   Iron, Ferrous Sulfate, 325 (65 Fe) MG TABS Take 325 mg by mouth daily. 100 tablet 0   Lancets (ACCU-CHEK SOFT TOUCH) lancets Use as instructed 100 each 12   loperamide (IMODIUM) 2 MG capsule TAKE 1 CAPSULE (2 MG TOTAL) BY MOUTH 4 (FOUR) TIMES DAILY AS NEEDED FOR DIARRHEA OR LOOSE STOOLS. 15 capsule 0   loratadine (CLARITIN) 10 MG tablet Take 10 mg by mouth daily.     meloxicam (MOBIC) 15 MG tablet TAKE 1 TABLET (15 MG TOTAL) BY MOUTH DAILY. (AM) 30 tablet 0   metFORMIN (GLUCOPHAGE-XR) 750 MG 24 hr tablet TAKE 1 TABLET (750 MG TOTAL) BY MOUTH 2 (TWO) TIMES DAILY. 60 tablet 3   methocarbamol (ROBAXIN) 750 MG tablet TAKE 1 TABLET (750 MG TOTAL) BY MOUTH EVERY 12 (TWELVE) HOURS AS NEEDED FOR MUSCLE  SPASMS. 40 tablet 1   metoCLOPramide (REGLAN) 5 MG tablet Take 5 mg by mouth 3 (three) times daily as needed. Take 1 tablet by mouth three times a day before meals as needed for nausea and vomiting.     montelukast (SINGULAIR) 10 MG tablet Take 10 mg by mouth at bedtime.      Multiple Vitamins-Minerals (CENTRUM SILVER 50+WOMEN PO) Take by mouth.     nicotine (NICODERM CQ - DOSED IN MG/24 HOURS) 14 mg/24hr patch PLACE 1 PATCH ONTO THE SKIN DAILY 28 patch 1   ofloxacin (OCUFLOX) 0.3 % ophthalmic solution Place 1 drop into the right eye 4 (four) times daily.     pantoprazole (PROTONIX) 40 MG tablet Take 1 tablet (40 mg total) by mouth daily. 30 tablet 2   polyethylene glycol (MIRALAX / GLYCOLAX) 17 g packet      potassium chloride SA (KLOR-CON) 20 MEQ tablet TAKE ONE TABLET BY MOUTH ONCE DAILY (NOON) 90 tablet 1   Respiratory Therapy Supplies (FLUTTER) DEVI Use after breathing treatment 4 times daily 1 each 0   SPIRIVA HANDIHALER 18 MCG inhalation capsule PLACE 1 CAPSULE (18 MCG TOTAL) INTO INHALER AND INHALE DAILY. 30 capsule 2   sucralfate (CARAFATE) 1 g tablet Take 1 g by mouth 4 (four) times daily -  with meals and at bedtime.     triamcinolone cream (KENALOG) 0.1 % Apply 1 application topically 2 (two) times daily. 30 g 0   valsartan-hydrochlorothiazide (DIOVAN-HCT) 160-12.5 MG tablet TAKE ONE TABLET BY MOUTH ONCE DAILY (AM) 90 tablet 1   FLOWFLEX COVID-19 AG HOME TEST KIT See admin instructions.     metroNIDAZOLE (FLAGYL) 500 MG tablet Take 1 tablet (500 mg total) by mouth 2 (two) times daily. (Patient not taking: Reported on 03/13/2021) 14 tablet 0   No current facility-administered medications for this visit.    Review of Systems Review of Systems  Constitutional: Negative.   Genitourinary: Negative.  Blood pressure 123/70, pulse 86, weight 156 lb 4.8 oz (70.9 kg), last menstrual period 09/01/2010.  Physical Exam Physical Exam Vitals and nursing note reviewed. Exam conducted with a  chaperone present.  Constitutional:      Appearance: She is not ill-appearing.  Genitourinary:    General: Normal vulva.     Exam position: Lithotomy position.     Vagina: Normal.     Cervix: Normal.  Neurological:     Mental Status: She is alert.  Psychiatric:        Mood and Affect: Mood normal.        Behavior: Behavior normal.   Patient given informed consent, signed copy in the chart, time out was performed.  Placed in lithotomy position. Cervix viewed with speculum and colposcope after application of acetic acid.   Colposcopy adequate?  yes [place]et[place]hite lesions?no Punctation?no Mosaicism?  no Abnormal vasculature?  no Biopsies?3 and 9 ECC?yes    Data Reviewed Pap and bx results  Assessment   LSIL pap no suspicious lesions Procedure Details  The risks and benefits of the procedure and Written informed consent obtained.  Speculum placed in vagina and excellent visualization of cervix achieved, cervix swabbed x 3 with acetic acid solution.  Specimens: ECC and 2 biopsies  Complications: none.     Plan    Specimens labelled and sent to Pathology. Anticipate pap in 12 months      Emeterio Reeve 03/13/2021, 2:07 PM

## 2021-03-17 LAB — SURGICAL PATHOLOGY

## 2021-03-18 ENCOUNTER — Other Ambulatory Visit: Payer: Self-pay

## 2021-03-19 ENCOUNTER — Encounter: Payer: Self-pay | Admitting: Surgery

## 2021-03-19 ENCOUNTER — Ambulatory Visit (INDEPENDENT_AMBULATORY_CARE_PROVIDER_SITE_OTHER): Payer: Medicaid Other | Admitting: Surgery

## 2021-03-19 ENCOUNTER — Other Ambulatory Visit: Payer: Self-pay

## 2021-03-19 VITALS — BP 124/76 | HR 103 | Ht 66.0 in | Wt 156.0 lb

## 2021-03-19 DIAGNOSIS — M4316 Spondylolisthesis, lumbar region: Secondary | ICD-10-CM

## 2021-03-19 DIAGNOSIS — M48062 Spinal stenosis, lumbar region with neurogenic claudication: Secondary | ICD-10-CM

## 2021-03-19 NOTE — Progress Notes (Signed)
Office Visit Note   Patient: Stacy [name]           Date of Birth: 1960-09-26           MRN: [id number] Visit Date: 03/19/2021              Requested by: [name], MD [address] [place],  [place] [address] PCP: [name], MD   Assessment & Plan: Visit Diagnoses:  1. Spondylolisthesis, lumbar region   2. Spinal stenosis, lumbar region, with neurogenic claudication   L2-L5 stenosis  Plan: We will proceed with LEFT L2-3, L3-4, L4-5 TRANSFORAMINAL LUMBAR INTERBODY FUSIONS WITH RODS, SCREWS AND CAGES, LOCAL BONE GRAFT, ALLOGRAFT BONE GRAFT, VIVIGEN as scheduled.  Surgical procedure discussed along with potential hospital stay.  Stressed to patient the importance of cutting back on her smoking due to to the increased risk of pseudoarthrosis.  All questions answered.  Follow-Up Instructions: Return for Return office visit 2 weeks postop with Dr. [name].   Orders:  No orders of the defined types were placed in this encounter.  No orders of the defined types were placed in this encounter.     Procedures: No procedures performed   Clinical Data: No additional findings.   Subjective: Chief Complaint  Patient presents with   Lower Back - Pain    H&P 03/25/2021 Left L2-3, L3-4, L4-5 [id number]    HPI 61 year old white female history of L2-3, L3-4, L4-5 stenosis comes in for prep evaluation.  States that symptoms unchanged in previous visit.  She is wanting to proceed with LEFT L2-3, L3-4, L4-5 TRANSFORAMINAL LUMBAR INTERBODY FUSIONS WITH RODS, SCREWS AND CAGES, LOCAL BONE GRAFT, ALLOGRAFT BONE GRAFT, VIVIGEN.  Today history and physical performed.  Review of systems negative.  We have received preop medical/cardiac clearance from Dr. Karle Plumber January 20, 2021. Review of Systems No current cardiopulmonary GI/GU issues  Objective: Vital Signs: BP 124/76    Pulse (!) 103    Ht 5\' 6"  (1.676 m)    Wt 156 lb (70.8 kg)    LMP 09/01/2010    BMI  25.18 kg/m   Physical Exam HENT:     Head: Normocephalic and atraumatic.     Nose: Nose normal.  Eyes:     Extraocular Movements: Extraocular movements intact.  Cardiovascular:     Rate and Rhythm: Regular rhythm. Tachycardia present.  Pulmonary:     Effort: Pulmonary effort is normal.     Breath sounds: Wheezing (Few scattered) present.  Abdominal:     General: Bowel sounds are normal. There is no distension.  Musculoskeletal:        General: Tenderness present.  Neurological:     Mental Status: She is alert and oriented to person, place, and time.  Psychiatric:        Mood and Affect: Mood normal.    Ortho Exam  Specialty Comments:  No specialty comments available.  Imaging: No results found.   PMFS History: Patient Active Problem List   Diagnosis Date Noted   Colon polyps 06/06/2020   Lupus ([place]) 06/06/2020   Allergic rhinitis due to animal (cat) (dog) hair and dander 04/08/2020   Allergic rhinitis due to pollen 04/08/2020   Food allergy 04/08/2020   Acute medial meniscus tear, left, subsequent encounter 03/21/2020   Chronic pain of left knee 02/15/2020   Paresthesia of skin 11/16/2019   History of total knee replacement, right 11/16/2019   Tobacco abuse 11/16/2019   Centrilobular emphysema ([place]) 08/10/2019  Incidental lung nodule, > 43mm and < 16mm 08/10/2019   OSA on CPAP 08/10/2019   Dyspnea on exertion 08/02/2019   Hyperlipidemia 08/02/2019   Lumbar radiculopathy 04/24/2019   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   Post laminectomy syndrome 02/23/2019   Abnormality of gait 02/23/2019   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   Unilateral primary osteoarthritis, left hip 12/28/2018   Lesion of skin of left ear 12/26/2018   Primary osteoarthritis of left hip 12/02/2018   Iron deficiency anemia 10/16/2018   Chronic pain syndrome 09/08/2018   Chronic pain of right knee 08/11/2018   Status post lumbar laminectomy 07/15/2018   Peripheral arterial disease  ([place]) 04/05/2018   Moderate persistent asthma without complication 123456   Environmental and seasonal allergies 06/29/2017   Controlled type 2 diabetes mellitus with diabetic polyneuropathy, without long-term current use of insulin ([place]) 06/29/2017   Perennial allergic rhinitis 04/08/2017   Sensorineural hearing loss (SNHL), bilateral 04/08/2017   Chronic pansinusitis 03/25/2017   Eustachian tube dysfunction, bilateral 0000000   Lichen planopilaris [id number]   Herniation of lumbar intervertebral disc with radiculopathy 10/02/2016    Class: Chronic   Alopecia areata 08/19/2016   Chondromalacia of both patellae 06/03/2015    Class: Chronic   Spinal stenosis, lumbar region, with neurogenic claudication 06/03/2015   Tobacco use disorder 04/25/2015   DJD (degenerative joint disease) of knee 01/04/2015   Hemorrhoid 11/14/2014   Gout of big toe 07/19/2014   Essential hypertension 08/14/2013   Gastroesophageal reflux disease without esophagitis 08/14/2013   COPD (chronic obstructive pulmonary disease) ([place]) 04/17/2011   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   Asthma    COPD (chronic obstructive pulmonary disease) ([place])    Depression    Diabetes mellitus    Type II   GERD (gastroesophageal reflux disease)    Pt on Protonix daily   Glaucoma    Gout    Headache(784.0)    otc meds prn   Hyperlipidemia    Hypertension    Irritable bowel syndrome 11/19/2010   Neuropathy    Pneumonia YRS AGO   Restless legs    Shortness of breath    07/14/2018- uses  4 times a day    Family History  Problem Relation Age of Onset   Hypertension Father    Cancer Father    Heart disease Mother    Asthma Son        had as a child   Heart disease Sister    Breast cancer Sister    Hypertension Brother     Past Surgical History:  Procedure Laterality Date   BACK  SURGERY     CHOLECYSTECTOMY     COLONOSCOPY     ENDOMETRIAL ABLATION  10/2010   EYE SURGERY     HERNIA REPAIR     umbicial hernia   JOINT REPLACEMENT Left 03/14/2019   Dr. Ninfa Linden hip   KNEE ARTHROSCOPY Left    06/07/2017 Dr. Marlou Sa of [facility] ARTHROSCOPY Left 03/21/2020   Procedure: LEFT KNEE ARTHROSCOPY WITH PARTIAL MEDIAL MENISCECTOMY;  Surgeon: Mcarthur Rossetti, MD;  Location: [place];  Service: Orthopedics;  Laterality: Left;   KNEE CLOSED REDUCTION Right 12/06/2015   Procedure: CLOSED MANIPULATION RIGHT KNEE;  Surgeon: [name], MD;  Location: [place];  Service: Orthopedics;  Laterality: Right;   KNEE CLOSED REDUCTION Right 01/17/2016   Procedure: CLOSED MANIPULATION RIGHT KNEE;  Surgeon: [name], MD;  Location: [place];  Service: Orthopedics;  Laterality: Right;   KNEE JOINT MANIPULATION Right 12/06/2015   LACRIMAL TUBE INSERTION Bilateral 03/01/2019   Procedure: LACRIMAL TUBE INSERTION;  Surgeon: Gevena Cotton, MD;  Location: [facility];  Service: Ophthalmology;  Laterality: Bilateral;   LACRIMAL TUBE REMOVAL Bilateral 05/03/2019   Procedure: BILATERAL NASOLACRIMAL DUCT PROBING, IIRIGATION AND TUBE REMOVAL BOTH EYES;  Surgeon: Gevena Cotton, MD;  Location: [place];  Service: Ophthalmology;  Laterality: Bilateral;   LUMBAR DISC SURGERY  06/03/2015   L 2  L3 L4 L5    LUMBAR LAMINECTOMY/DECOMPRESSION MICRODISCECTOMY N/A 06/03/2015   Procedure: Bilateral lateral recess decompression L2-3, L3-4, L4-5;  Surgeon: [name], MD;  Location: [place];  Service: Orthopedics;  Laterality: N/A;   LUMBAR LAMINECTOMY/DECOMPRESSION MICRODISCECTOMY N/A 10/02/2016   Procedure: Right L5-S1 Lateral Recess Decompression  microdiscectomy;  Surgeon: [name], MD;  Location: [place];  Service: Orthopedics;  Laterality: N/A;   LUMBAR LAMINECTOMY/DECOMPRESSION MICRODISCECTOMY N/A 07/15/2018   Procedure: LEFT L3-4  MICRODISCECTOMY;  Surgeon: [name], MD;  Location: [place];  Service: Orthopedics;  Laterality: N/A;   svd      x 2   TEAR DUCT PROBING Bilateral 03/01/2019   Procedure: TEAR DUCT PROBING WITH IRRIGATION;  Surgeon: Gevena Cotton, MD;  Location: [facility];  Service: Ophthalmology;  Laterality: Bilateral;   TOTAL HIP ARTHROPLASTY Left 03/14/2019   Procedure: LEFT TOTAL HIP ARTHROPLASTY ANTERIOR APPROACH;  Surgeon: Mcarthur Rossetti, MD;  Location: [place];  Service: Orthopedics;  Laterality: Left;   TOTAL KNEE ARTHROPLASTY Right 09/06/2015   Procedure: RIGHT TOTAL KNEE ARTHROPLASTY;  Surgeon: [name], MD;  Location: [place];  Service: Orthopedics;  Laterality: Right;   TUBAL LIGATION     UPPER GASTROINTESTINAL ENDOSCOPY  04/28/11   Social History   Occupational History   Occupation: unemployed    Fish farm manager: UNEMPLOYED  Tobacco Use   Smoking status: Some Days    Packs/day: 0.25    Years: 32.00    Pack years: 8.00    Types: Cigarettes   Smokeless tobacco: Never   Tobacco comments:    DOWN TO 3 PER DAY  Vaping Use   Vaping Use: Never used  Substance and Sexual Activity   Alcohol use: No   Drug use: No   Sexual activity: Yes    Birth control/protection: Surgical, Post-menopausal    Comment: tubal ligation

## 2021-03-19 NOTE — Pre-Procedure Instructions (Signed)
Surgical Instructions    Your procedure is scheduled on Tuesday, February 21st.  Report to [facility] Main Entrance "A" at 05:30 A.M., then check in with the Admitting office.  Call this number if you have problems the morning of surgery:  [phone]   If you have any questions prior to your surgery date call [phone]: Open Monday-Friday 8am-4pm    Remember:  Do not eat after midnight the night before your surgery  You may drink clear liquids until 04:30 AM the morning of your surgery.   Clear liquids allowed are: Water, Non-Citrus Juices (without pulp), Carbonated Beverages, Clear Tea, Black Coffee Only (NO MILK, CREAM OR POWDERED CREAMER of any kind), and Gatorade.    Take these medicines the morning of surgery with A SIP OF WATER  allopurinol (ZYLOPRIM) budesonide-formoterol (SYMBICORT)  dicyclomine (BENTYL) DULoxetine (CYMBALTA) fluticasone (FLONASE)  gabapentin (NEURONTIN) loratadine (CLARITIN) ofloxacin (OCUFLOX) 0.3 % ophthalmic solution pantoprazole (PROTONIX) SPIRIVA HANDIHALER 18 MCG    If needed: albuterol (PROVENTIL) nebulizer HYDROcodone-acetaminophen (NORCO/VICODIN) hydrOXYzine (ATARAX)  methocarbamol (ROBAXIN)  metoCLOPramide (REGLAN)  As of today, STOP taking any Aspirin (unless otherwise instructed by your surgeon) Aleve, Naproxen, Ibuprofen, Motrin, Advil, Goody's, BC's, all herbal medications, fish oil, and all vitamins. This includes your diclofenac Sodium (VOLTAREN) 1 % GEL and meloxicam (MOBIC).  Follow surgeon's instructions regarding clopidogrel (PLAVIX). If no instructions were given, contact surgeon for instructions as soon as possible.   WHAT DO I DO ABOUT MY DIABETES MEDICATION?   Do not take glimepiride (AMARYL) or metFORMIN (GLUCOPHAGE-XR) the morning of surgery (2/21) Take morning dose of glimepiride (AMARYL) as usual morning before surgery (2/20)     HOW TO MANAGE YOUR DIABETES BEFORE AND AFTER SURGERY  Why is it important to  control my blood sugar before and after surgery? Improving blood sugar levels before and after surgery helps healing and can limit problems. A way of improving blood sugar control is eating a healthy diet by:  Eating less sugar and carbohydrates  Increasing activity/exercise  Talking with your doctor about reaching your blood sugar goals High blood sugars (greater than 180 mg/dL) can raise your risk of infections and slow your recovery, so you will need to focus on controlling your diabetes during the weeks before surgery. Make sure that the doctor who takes care of your diabetes knows about your planned surgery including the date and location.  How do I manage my blood sugar before surgery? Check your blood sugar at least 4 times a day, starting 2 days before surgery, to make sure that the level is not too high or low.  Check your blood sugar the morning of your surgery when you wake up and every 2 hours until you get to the Short Stay unit.  If your blood sugar is less than 70 mg/dL, you will need to treat for low blood sugar: Do not take insulin. Treat a low blood sugar (less than 70 mg/dL) with  cup of clear juice (cranberry or apple), 4 glucose tablets, OR glucose gel. Recheck blood sugar in 15 minutes after treatment (to make sure it is greater than 70 mg/dL). If your blood sugar is not greater than 70 mg/dL on recheck, call [phone] for further instructions. Report your blood sugar to the short stay nurse when you get to Short Stay.  If you are admitted to the hospital after surgery: Your blood sugar will be checked by the staff and you will probably be given insulin after surgery (instead of oral diabetes medicines)  to make sure you have good blood sugar levels. The goal for blood sugar control after surgery is 80-180 mg/dL.                        Do NOT Smoke (Tobacco/Vaping) for 24 hours prior to your procedure.  If you use a CPAP at night, you may bring your mask/headgear  for your overnight stay.   Contacts, glasses, piercing's, hearing aid's, dentures or partials may not be worn into surgery, please bring cases for these belongings.    For patients admitted to the hospital, discharge time will be determined by your treatment team.   Patients discharged the day of surgery will not be allowed to drive home, and someone needs to stay with them for 24 hours.  NO VISITORS WILL BE ALLOWED IN PRE-OP WHERE PATIENTS ARE PREPPED FOR SURGERY.  ONLY 1 SUPPORT PERSON MAY BE PRESENT IN THE WAITING ROOM WHILE YOU ARE IN SURGERY.  IF YOU ARE TO BE ADMITTED, ONCE YOU ARE IN YOUR ROOM YOU WILL BE ALLOWED TWO (2) VISITORS. (1) VISITOR MAY STAY OVERNIGHT BUT MUST ARRIVE TO THE ROOM BY 8pm.  Minor children may have two parents present. Special consideration for safety and communication needs will be reviewed on a case by case basis.   Special instructions:   [place]- Preparing For Surgery  Before surgery, you can play an important role. Because skin is not sterile, your skin needs to be as free of germs as possible. You can reduce the number of germs on your skin by washing with CHG (chlorahexidine gluconate) Soap before surgery.  CHG is an antiseptic cleaner which kills germs and bonds with the skin to continue killing germs even after washing.    Oral Hygiene is also important to reduce your risk of infection.  Remember - BRUSH YOUR TEETH THE MORNING OF SURGERY WITH YOUR REGULAR TOOTHPASTE  Please do not use if you have an allergy to CHG or antibacterial soaps. If your skin becomes reddened/irritated stop using the CHG.  Do not shave (including legs and underarms) for at least 48 hours prior to first CHG shower. It is OK to shave your face.  Please follow these instructions carefully.   Shower the NIGHT BEFORE SURGERY and the MORNING OF SURGERY  If you chose to wash your hair, wash your hair first as usual with your normal shampoo.  After you shampoo, rinse your hair and  body thoroughly to remove the shampoo.  Use CHG Soap as you would any other liquid soap. You can apply CHG directly to the skin and wash gently with a scrungie or a clean washcloth.   Apply the CHG Soap to your body ONLY FROM THE NECK DOWN.  Do not use on open wounds or open sores. Avoid contact with your eyes, ears, mouth and genitals (private parts). Wash Face and genitals (private parts)  with your normal soap.   Wash thoroughly, paying special attention to the area where your surgery will be performed.  Thoroughly rinse your body with warm water from the neck down.  DO NOT shower/wash with your normal soap after using and rinsing off the CHG Soap.  Pat yourself dry with a CLEAN TOWEL.  Wear CLEAN PAJAMAS to bed the night before surgery  Place CLEAN SHEETS on your bed the night before your surgery  DO NOT SLEEP WITH PETS.   Day of Surgery: Shower with CHG soap. Do not wear jewelry, make up, nail  polish, gel polish, artificial nails, or any other type of covering on natural nails including finger and toenails. If patients have artificial nails, gel coating, etc. that need to be removed by a nail salon please have this removed prior to surgery. Surgery may need to be canceled/delayed if the surgeon/anesthesiologist feels like the patient is unable to be adequately monitored. Do not wear lotions, powders, perfumes, or deodorant. Do not shave 48 hours prior to surgery.   Do not bring valuables to the hospital. [facility] is not responsible for any belongings or valuables. Wear Clean/Comfortable clothing the morning of surgery Remember to brush your teeth WITH YOUR REGULAR TOOTHPASTE.   Please read over the following fact sheets that you were given.   3 days prior to your procedure or After your COVID test   You are not required to quarantine however you are required to wear a well-fitting mask when you are out and around people not in your household. If your mask becomes wet or  soiled, replace with a new one.   Wash your hands often with soap and water for 20 seconds or clean your hands with an alcohol-based hand sanitizer that contains at least 60% alcohol.   Do not share personal items.   Notify your provider:  o if you are in close contact with someone who has COVID  o or if you develop a fever of 100.4 or greater, sneezing, cough, sore throat, shortness of breath or body aches.

## 2021-03-20 ENCOUNTER — Encounter (HOSPITAL_COMMUNITY)
Admission: RE | Admit: 2021-03-20 | Discharge: 2021-03-20 | Disposition: A | Discharge status: Home / Self Care | Payer: Medicaid Other | Source: Ambulatory Visit | Attending: Specialist | Admitting: Specialist

## 2021-03-20 ENCOUNTER — Telehealth: Payer: Self-pay | Admitting: Radiology

## 2021-03-20 ENCOUNTER — Encounter (HOSPITAL_COMMUNITY): Payer: Self-pay

## 2021-03-20 VITALS — BP 125/83 | HR 79 | Temp 98.5°F | Resp 17 | Ht 66.0 in | Wt 159.0 lb

## 2021-03-20 DIAGNOSIS — I10 Essential (primary) hypertension: Secondary | ICD-10-CM

## 2021-03-20 DIAGNOSIS — E1142 Type 2 diabetes mellitus with diabetic polyneuropathy: Secondary | ICD-10-CM | POA: Insufficient documentation

## 2021-03-20 DIAGNOSIS — Z01812 Encounter for preprocedural laboratory examination: Secondary | ICD-10-CM | POA: Insufficient documentation

## 2021-03-20 DIAGNOSIS — Z01818 Encounter for other preprocedural examination: Secondary | ICD-10-CM

## 2021-03-20 HISTORY — DX: Sleep apnea, unspecified: G47.30

## 2021-03-20 LAB — TYPE AND SCREEN
ABO/RH(D): O POS
Antibody Screen: NEGATIVE

## 2021-03-20 LAB — BASIC METABOLIC PANEL
Anion gap: 8 (ref 5–15)
BUN: 14 mg/dL (ref 8–23)
CO2: 30 mmol/L (ref 22–32)
Calcium: 10 mg/dL (ref 8.9–10.3)
Chloride: 99 mmol/L (ref 98–111)
Creatinine, Ser: 0.86 mg/dL (ref 0.44–1.00)
GFR, Estimated: >60 mL/min (ref >60)
Glucose, Bld: 50 mg/dL — ABNORMAL LOW (ref 70–99)
Potassium: 4.6 mmol/L (ref 3.5–5.1)
Sodium: 137 mmol/L (ref 135–145)

## 2021-03-20 LAB — GLUCOSE, CAPILLARY: Glucose-Capillary: 94 mg/dL (ref 70–99)

## 2021-03-20 LAB — CBC
HCT: 36.4 % (ref 36.0–46.0)
Hemoglobin: 11.6 g/dL — ABNORMAL LOW (ref 12.0–15.0)
MCH: 25.4 pg — ABNORMAL LOW (ref 26.0–34.0)
MCHC: 31.9 g/dL (ref 30.0–36.0)
MCV: 79.6 fL — ABNORMAL LOW (ref 80.0–100.0)
Platelets: 298 10*3/uL (ref 150–400)
RBC: 4.57 MIL/uL (ref 3.87–5.11)
RDW: 17.8 % — ABNORMAL HIGH (ref 11.5–15.5)
WBC: 7.7 10*3/uL (ref 4.0–10.5)
nRBC: 0 % (ref 0.0–0.2)

## 2021-03-20 LAB — SURGICAL PCR SCREEN
MRSA, PCR: NEGATIVE
Staphylococcus aureus: NEGATIVE

## 2021-03-20 LAB — HEMOGLOBIN A1C
Hgb A1c MFr Bld: 5.7 % — ABNORMAL HIGH (ref 4.8–5.6)
Mean Plasma Glucose: 116.89 mg/dL

## 2021-03-20 NOTE — Telephone Encounter (Signed)
Per secure chat with Dr. [name], patient should stop Plavix today.  I called and advised patient.

## 2021-03-20 NOTE — Telephone Encounter (Signed)
Patient is having her pre-op testing done at [facility] and states that she has not been advised on when to stop Plavix. I spoke with Fayrene Fearing who states that we need to reach out to whomever prescribes plavix to see when it is safe for patient to stop. Per Charlynne Pander at [facility], ok to call patient back to advise. Best number to reach patient is [phone].  Patient is scheduled for lumbar fusion on 03/25/2021 with Dr. [name].

## 2021-03-20 NOTE — Progress Notes (Signed)
PCP - Dr. Jonah Blue Cardiologist - denies  PPM/ICD - denies   Chest x-ray - 01/19/19 EKG - 01/20/21 Stress Test - 06/21/06 ECHO - 08/25/19 Cardiac Cath - denies  Sleep Study - 06/12/19 OSA+ CPAP - yes, every night  DM- Type 2 Fasting Blood Sugar - 100-120 Checks Blood Sugar 3 times a day  Blood Thinner Instructions: spoke with Dr. Barbaraann Faster office, they will call PCP and notify pt of instructions on Eliquis Aspirin Instructions: n/a  ERAS Protcol - yes, no drink   COVID TEST- pt scheduled testing for 2/20   Anesthesia review: no  Patient denies shortness of breath, fever, cough and chest pain at PAT appointment   All instructions explained to the patient, with a verbal understanding of the material. Patient agrees to go over the instructions while at home for a better understanding. Patient also instructed to wear a mask in public after being tested for COVID-19. The opportunity to ask questions was provided.

## 2021-03-20 NOTE — Telephone Encounter (Signed)
Can you please advise? Thanks.

## 2021-03-21 ENCOUNTER — Telehealth: Payer: Self-pay | Admitting: Lactation Services

## 2021-03-21 ENCOUNTER — Other Ambulatory Visit: Payer: Self-pay | Admitting: Internal Medicine

## 2021-03-21 DIAGNOSIS — E119 Type 2 diabetes mellitus without complications: Secondary | ICD-10-CM

## 2021-03-21 MED ORDER — ACCU-CHEK AVIVA PLUS VI STRP: ORAL_STRIP | 12 refills | Status: DC

## 2021-03-21 NOTE — Progress Notes (Signed)
LSIL Bx, repeat pap 12 months

## 2021-03-21 NOTE — Telephone Encounter (Signed)
Has test strip refills, will refuse it.  Requested Prescriptions  Pending Prescriptions Disp Refills   Accu-Chek Softclix Lancets lancets 100 each 12    Sig: Use as instructed     Endocrinology: Diabetes - Testing Supplies Passed - 03/21/2021  5:00 PM      Passed - Valid encounter within last 12 months    Recent Outpatient Visits           2 months ago Preoperative evaluation to rule out surgical contraindication   [facility] Jonah Blue B, MD   2 months ago Pap smear for cervical cancer screening   [place] [facility] Jonah Blue B, MD   9 months ago Type 2 diabetes mellitus with diabetic polyneuropathy, without long-term current use of insulin (HCC)   [place] [facility] Jonah Blue B, MD   1 year ago Type 2 diabetes mellitus with diabetic polyneuropathy, without long-term current use of insulin (HCC)   [facility] Marcine Matar, MD   1 year ago Need for influenza vaccination   [facility] [place], Jeannett Senior L, RPH-CPP               glucose blood (ACCU-CHEK AVIVA PLUS) test strip 100 each 12    Sig: Use as instructed     Endocrinology: Diabetes - Testing Supplies Passed - 03/21/2021  5:00 PM      Passed - Valid encounter within last 12 months    Recent Outpatient Visits           2 months ago Preoperative evaluation to rule out surgical contraindication   [facility] Jonah Blue B, MD   2 months ago Pap smear for cervical cancer screening   [place] [facility] Jonah Blue B, MD   9 months ago Type 2 diabetes mellitus with diabetic polyneuropathy, without long-term current use of insulin [facility])   [place] [facility] Jonah Blue B, MD   1 year ago Type 2 diabetes mellitus with diabetic polyneuropathy, without long-term current use of  insulin (HCC)   [place] [facility] Marcine Matar, MD   1 year ago Need for influenza vaccination   [facility] Lois Huxley, Cornelius Moras, RPH-CPP

## 2021-03-21 NOTE — Telephone Encounter (Signed)
Medication: Accu-Chek Softclix Lancets lancets [[id number]] , ACCU-CHEK AVIVA PLUS Test Strips  Has the patient contacted their pharmacy? YES (Agent: If no, request that the patient contact the pharmacy for the refill. If patient does not wish to contact the pharmacy document the reason why and proceed with request.) (Agent: If yes, when and what did the pharmacy advise?)  Preferred Pharmacy (with phone number or street name): [facility] - [place], [place] - [address] [place] [address] Phone: [phone] Fax: [phone] Hours: Not open 24 hours   Has the patient been seen for an appointment in the last year OR does the patient have an upcoming appointment? 12/22  Agent: Please be advised that RX refills may take up to 3 business days. We ask that you follow-up with your pharmacy.

## 2021-03-21 NOTE — Telephone Encounter (Signed)
Called patient to let her know results of Colposcopy showing LGSIL and need for repeat Pap Smear in 2023. Patient aware that she will need to call and schedule around October. Patient voiced understanding.

## 2021-03-21 NOTE — Telephone Encounter (Signed)
-----   Message from Adam Phenix, MD sent at 03/21/2021  9:19 AM EST ----- LSIL Bx, repeat pap 12 months

## 2021-03-21 NOTE — Telephone Encounter (Signed)
Requested Prescriptions  Pending Prescriptions Disp Refills   Accu-Chek Softclix Lancets lancets 100 each 12    Sig: Use as instructed     Endocrinology: Diabetes - Testing Supplies Passed - 03/21/2021  5:00 PM      Passed - Valid encounter within last 12 months    Recent Outpatient Visits          2 months ago Preoperative evaluation to rule out surgical contraindication   [place] Plumber B, MD   2 months ago Pap smear for cervical cancer screening   [place] Karle Plumber B, MD   9 months ago Type 2 diabetes mellitus with diabetic polyneuropathy, without long-term current use of insulin ([place])   [place], Deborah B, MD   1 year ago Type 2 diabetes mellitus with diabetic polyneuropathy, without long-term current use of insulin ([place])   [place] [name], MD   1 year ago Need for influenza vaccination   [place], Annie Main L, RPH-CPP              glucose blood (ACCU-CHEK AVIVA PLUS) test strip 100 each 12    Sig: Use as instructed     Endocrinology: Diabetes - Testing Supplies Passed - 03/21/2021  5:00 PM      Passed - Valid encounter within last 12 months    Recent Outpatient Visits          2 months ago Preoperative evaluation to rule out surgical contraindication   [place] Karle Plumber B, MD   2 months ago Pap smear for cervical cancer screening   [place] Karle Plumber B, MD   9 months ago Type 2 diabetes mellitus with diabetic polyneuropathy, without long-term current use of insulin ([place])   [place], Deborah B, MD   1 year ago Type 2 diabetes mellitus with diabetic polyneuropathy, without long-term current use of insulin ([place])   [place] [name], MD   1 year ago Need for influenza vaccination   [place], Annie Main L, RPH-CPP             Lancets have 6 refills.

## 2021-03-24 ENCOUNTER — Other Ambulatory Visit (HOSPITAL_COMMUNITY)
Admission: RE | Admit: 2021-03-24 | Discharge: 2021-03-24 | Disposition: A | Discharge status: Home / Self Care | Payer: Medicaid Other | Source: Ambulatory Visit | Attending: Specialist | Admitting: Specialist

## 2021-03-24 LAB — SARS CORONAVIRUS 2 (TAT 6-24 HRS): SARS Coronavirus 2: NEGATIVE

## 2021-03-24 NOTE — Anesthesia Preprocedure Evaluation (Addendum)
Anesthesia Evaluation  Patient identified by MRN, date of birth, ID band Patient awake    Reviewed: Allergy & Precautions, NPO status , Patient's Chart, lab work & pertinent test results  History of Anesthesia Complications Negative for: history of anesthetic complications  Airway Mallampati: II  TM Distance: >3 FB Neck ROM: Full    Dental  (+) Dental Advisory Given, Edentulous Upper   Pulmonary asthma , sleep apnea and Continuous Positive Airway Pressure Ventilation , COPD,  COPD inhaler, Current Smoker and Patient abstained from smoking.,    Pulmonary exam normal        Cardiovascular hypertension, Pt. on medications + Peripheral Vascular Disease  Normal cardiovascular exam     Neuro/Psych Anxiety Depression negative neurological ROS     GI/Hepatic Neg liver ROS, GERD  ,  Endo/Other  diabetes, Type 2, Oral Hypoglycemic Agents  Renal/GU negative Renal ROS  negative genitourinary   Musculoskeletal  (+) Arthritis ,   Abdominal   Peds  Hematology  (+) Blood dyscrasia, anemia , Plavix for primary prevention d/t aspirin intolerance   Anesthesia Other Findings   Reproductive/Obstetrics                           Anesthesia Physical Anesthesia Plan  ASA: 3  Anesthesia Plan: General   Post-op Pain Management: Ofirmev IV (intra-op)*, Ketamine IV* and Dilaudid IV   Induction: Intravenous  PONV Risk Score and Plan: 2 and Ondansetron, Dexamethasone, Treatment may vary due to age or medical condition and Midazolam  Airway Management Planned: Oral ETT  Additional Equipment: None  Intra-op Plan:   Post-operative Plan: Extubation in OR  Informed Consent: I have reviewed the patients History and Physical, chart, labs and discussed the procedure including the risks, benefits and alternatives for the proposed anesthesia with the patient or authorized representative who has indicated his/her  understanding and acceptance.     Dental advisory given  Plan Discussed with:   Anesthesia Plan Comments:       Anesthesia Quick Evaluation

## 2021-03-25 ENCOUNTER — Other Ambulatory Visit: Payer: Self-pay

## 2021-03-25 ENCOUNTER — Inpatient Hospital Stay (HOSPITAL_COMMUNITY)
Admission: RE | Admit: 2021-03-25 | Discharge: 2021-03-28 | DRG: 454 | Disposition: A | Discharge status: Home / Self Care | Payer: Medicaid Other | Attending: Specialist | Admitting: Specialist

## 2021-03-25 ENCOUNTER — Inpatient Hospital Stay (HOSPITAL_COMMUNITY): Payer: Medicaid Other | Admitting: Anesthesiology

## 2021-03-25 ENCOUNTER — Encounter (HOSPITAL_COMMUNITY)
Admission: RE | Disposition: A | Discharge status: Home / Self Care | Payer: Self-pay | Source: Home / Self Care | Attending: Specialist

## 2021-03-25 ENCOUNTER — Inpatient Hospital Stay (HOSPITAL_COMMUNITY): Payer: Medicaid Other

## 2021-03-25 ENCOUNTER — Encounter (HOSPITAL_COMMUNITY): Payer: Self-pay | Admitting: Specialist

## 2021-03-25 DIAGNOSIS — M47816 Spondylosis without myelopathy or radiculopathy, lumbar region: Secondary | ICD-10-CM

## 2021-03-25 DIAGNOSIS — Z96651 Presence of right artificial knee joint: Secondary | ICD-10-CM | POA: Diagnosis present

## 2021-03-25 DIAGNOSIS — M4726 Other spondylosis with radiculopathy, lumbar region: Secondary | ICD-10-CM

## 2021-03-25 DIAGNOSIS — Z981 Arthrodesis status: Secondary | ICD-10-CM | POA: Diagnosis not present

## 2021-03-25 DIAGNOSIS — F418 Other specified anxiety disorders: Secondary | ICD-10-CM | POA: Diagnosis not present

## 2021-03-25 DIAGNOSIS — M4316 Spondylolisthesis, lumbar region: Secondary | ICD-10-CM | POA: Diagnosis not present

## 2021-03-25 DIAGNOSIS — M4156 Other secondary scoliosis, lumbar region: Secondary | ICD-10-CM

## 2021-03-25 DIAGNOSIS — J449 Chronic obstructive pulmonary disease, unspecified: Secondary | ICD-10-CM | POA: Diagnosis present

## 2021-03-25 DIAGNOSIS — F17210 Nicotine dependence, cigarettes, uncomplicated: Secondary | ICD-10-CM | POA: Diagnosis present

## 2021-03-25 DIAGNOSIS — Z8249 Family history of ischemic heart disease and other diseases of the circulatory system: Secondary | ICD-10-CM

## 2021-03-25 DIAGNOSIS — D62 Acute posthemorrhagic anemia: Secondary | ICD-10-CM | POA: Diagnosis not present

## 2021-03-25 DIAGNOSIS — M4326 Fusion of spine, lumbar region: Secondary | ICD-10-CM | POA: Diagnosis not present

## 2021-03-25 DIAGNOSIS — Z803 Family history of malignant neoplasm of breast: Secondary | ICD-10-CM | POA: Diagnosis not present

## 2021-03-25 DIAGNOSIS — Z91013 Allergy to seafood: Secondary | ICD-10-CM | POA: Diagnosis not present

## 2021-03-25 DIAGNOSIS — Z96642 Presence of left artificial hip joint: Secondary | ICD-10-CM | POA: Diagnosis present

## 2021-03-25 DIAGNOSIS — Z20822 Contact with and (suspected) exposure to covid-19: Secondary | ICD-10-CM | POA: Diagnosis present

## 2021-03-25 DIAGNOSIS — I10 Essential (primary) hypertension: Secondary | ICD-10-CM | POA: Diagnosis present

## 2021-03-25 DIAGNOSIS — E1151 Type 2 diabetes mellitus with diabetic peripheral angiopathy without gangrene: Secondary | ICD-10-CM | POA: Diagnosis present

## 2021-03-25 DIAGNOSIS — G4730 Sleep apnea, unspecified: Secondary | ICD-10-CM | POA: Diagnosis present

## 2021-03-25 DIAGNOSIS — Z01818 Encounter for other preprocedural examination: Principal | ICD-10-CM

## 2021-03-25 DIAGNOSIS — M48062 Spinal stenosis, lumbar region with neurogenic claudication: Principal | ICD-10-CM | POA: Diagnosis present

## 2021-03-25 DIAGNOSIS — Z419 Encounter for procedure for purposes other than remedying health state, unspecified: Secondary | ICD-10-CM

## 2021-03-25 LAB — HEMOGLOBIN A1C
Hgb A1c MFr Bld: 5.8 % — ABNORMAL HIGH (ref 4.8–5.6)
Mean Plasma Glucose: 120 mg/dL

## 2021-03-25 LAB — GLUCOSE, CAPILLARY
Glucose-Capillary: 176 mg/dL — ABNORMAL HIGH (ref 70–99)
Glucose-Capillary: 162 mg/dL — ABNORMAL HIGH (ref 70–99)
Glucose-Capillary: 211 mg/dL — ABNORMAL HIGH (ref 70–99)
Glucose-Capillary: 203 mg/dL — ABNORMAL HIGH (ref 70–99)
Glucose-Capillary: 125 mg/dL — ABNORMAL HIGH (ref 70–99)

## 2021-03-25 SURGERY — POSTERIOR LUMBAR FUSION 3 LEVEL
Anesthesia: General | Site: Back

## 2021-03-25 MED ORDER — ALLOPURINOL 300 MG PO TABS
300.0000 mg | ORAL_TABLET | Freq: Every day | ORAL | Status: DC
  Administered 2021-03-25 – 2021-03-28 (×4): 300 mg via ORAL
  Filled 2021-03-25 (×4): qty 1

## 2021-03-25 MED ORDER — CEFAZOLIN SODIUM-DEXTROSE 2-4 GM/100ML-% IV SOLN
2.0000 g | INTRAVENOUS | Status: AC
  Administered 2021-03-25 (×2): 2 g via INTRAVENOUS
  Filled 2021-03-25: qty 100

## 2021-03-25 MED ORDER — CEFAZOLIN SODIUM 1 G IJ SOLR
INTRAMUSCULAR | Status: AC
  Filled 2021-03-25: qty 20

## 2021-03-25 MED ORDER — MUPIROCIN 2 % EX OINT
1.0000 | TOPICAL_OINTMENT | Freq: Once | CUTANEOUS | Status: AC
Start: 2021-03-25 — End: 2021-03-25

## 2021-03-25 MED ORDER — DEXAMETHASONE SODIUM PHOSPHATE 10 MG/ML IJ SOLN
INTRAMUSCULAR | Status: DC | PRN
  Administered 2021-03-25: 10 mg via INTRAVENOUS

## 2021-03-25 MED ORDER — PANTOPRAZOLE SODIUM 40 MG PO TBEC: 40.0000 mg | DELAYED_RELEASE_TABLET | Freq: Every day | ORAL | Status: DC

## 2021-03-25 MED ORDER — THROMBIN 20000 UNITS EX SOLR: CUTANEOUS | Status: DC | PRN

## 2021-03-25 MED ORDER — VALSARTAN-HYDROCHLOROTHIAZIDE 160-12.5 MG PO TABS: 1.0000 | ORAL_TABLET | Freq: Every day | ORAL | Status: DC

## 2021-03-25 MED ORDER — EPINEPHRINE 0.3 MG/0.3ML IJ SOAJ: 0.3000 mg | Freq: Once | INTRAMUSCULAR | Status: DC | PRN

## 2021-03-25 MED ORDER — HYDROMORPHONE HCL 1 MG/ML IJ SOLN
0.2500 mg | INTRAMUSCULAR | Status: DC | PRN
  Administered 2021-03-25 (×2): 0.5 mg via INTRAVENOUS

## 2021-03-25 MED ORDER — CELECOXIB 200 MG PO CAPS
200.0000 mg | ORAL_CAPSULE | Freq: Two times a day (BID) | ORAL | Status: DC
  Administered 2021-03-25 – 2021-03-28 (×6): 200 mg via ORAL
  Filled 2021-03-25 (×6): qty 1

## 2021-03-25 MED ORDER — HYDROMORPHONE HCL 1 MG/ML IJ SOLN
INTRAMUSCULAR | Status: AC
Start: 2021-03-25 — End: 2021-03-26
  Filled 2021-03-25: qty 1

## 2021-03-25 MED ORDER — ROCURONIUM BROMIDE 10 MG/ML (PF) SYRINGE
PREFILLED_SYRINGE | INTRAVENOUS | Status: DC | PRN
  Administered 2021-03-25: 50 mg via INTRAVENOUS
  Administered 2021-03-25: 100 mg via INTRAVENOUS
  Administered 2021-03-25: 30 mg via INTRAVENOUS
  Administered 2021-03-25: 50 mg via INTRAVENOUS
  Administered 2021-03-25: 40 mg via INTRAVENOUS

## 2021-03-25 MED ORDER — PHENYLEPHRINE 40 MCG/ML (10ML) SYRINGE FOR IV PUSH (FOR BLOOD PRESSURE SUPPORT)
PREFILLED_SYRINGE | INTRAVENOUS | Status: AC
  Filled 2021-03-25: qty 10

## 2021-03-25 MED ORDER — SODIUM CHLORIDE 0.9 % IV SOLN: INTRAVENOUS | Status: DC

## 2021-03-25 MED ORDER — FERROUS SULFATE 325 (65 FE) MG PO TABS
325.0000 mg | ORAL_TABLET | Freq: Every day | ORAL | Status: DC
  Administered 2021-03-25 – 2021-03-27 (×3): 325 mg via ORAL
  Filled 2021-03-25 (×3): qty 1

## 2021-03-25 MED ORDER — BUPIVACAINE LIPOSOME 1.3 % IJ SUSP
10.0000 mL | Freq: Once | INTRAMUSCULAR | Status: DC
  Filled 2021-03-25: qty 10

## 2021-03-25 MED ORDER — ONDANSETRON HCL 4 MG PO TABS: 4.0000 mg | ORAL_TABLET | Freq: Four times a day (QID) | ORAL | Status: DC | PRN

## 2021-03-25 MED ORDER — ATORVASTATIN CALCIUM 40 MG PO TABS
40.0000 mg | ORAL_TABLET | Freq: Every day | ORAL | Status: DC
  Administered 2021-03-25 – 2021-03-28 (×4): 40 mg via ORAL
  Filled 2021-03-25 (×4): qty 1

## 2021-03-25 MED ORDER — CEFAZOLIN SODIUM-DEXTROSE 2-4 GM/100ML-% IV SOLN
2.0000 g | Freq: Three times a day (TID) | INTRAVENOUS | Status: AC
  Administered 2021-03-25 – 2021-03-26 (×2): 2 g via INTRAVENOUS
  Filled 2021-03-25 (×2): qty 100

## 2021-03-25 MED ORDER — TIOTROPIUM BROMIDE MONOHYDRATE 18 MCG IN CAPS: 18.0000 ug | ORAL_CAPSULE | Freq: Every day | RESPIRATORY_TRACT | Status: DC

## 2021-03-25 MED ORDER — ALUM & MAG HYDROXIDE-SIMETH 200-200-20 MG/5ML PO SUSP: 30.0000 mL | Freq: Four times a day (QID) | ORAL | Status: DC | PRN

## 2021-03-25 MED ORDER — SODIUM CHLORIDE 0.9 % IV SOLN
250.0000 mL | INTRAVENOUS | Status: DC
  Administered 2021-03-25: 250 mL via INTRAVENOUS

## 2021-03-25 MED ORDER — POTASSIUM CHLORIDE CRYS ER 20 MEQ PO TBCR
20.0000 meq | EXTENDED_RELEASE_TABLET | Freq: Every day | ORAL | Status: DC
  Administered 2021-03-26 – 2021-03-28 (×3): 20 meq via ORAL
  Filled 2021-03-25 (×3): qty 1

## 2021-03-25 MED ORDER — INSULIN ASPART 100 UNIT/ML IJ SOLN
0.0000 [IU] | Freq: Three times a day (TID) | INTRAMUSCULAR | Status: DC
  Administered 2021-03-25: 5 [IU] via SUBCUTANEOUS
  Administered 2021-03-26: 2 [IU] via SUBCUTANEOUS
  Administered 2021-03-26: 5 [IU] via SUBCUTANEOUS
  Administered 2021-03-26 – 2021-03-27 (×2): 2 [IU] via SUBCUTANEOUS
  Administered 2021-03-27: 3 [IU] via SUBCUTANEOUS
  Administered 2021-03-27 – 2021-03-28 (×3): 2 [IU] via SUBCUTANEOUS

## 2021-03-25 MED ORDER — ORAL CARE MOUTH RINSE: 15.0000 mL | Freq: Once | OROMUCOSAL | Status: AC

## 2021-03-25 MED ORDER — POLYETHYLENE GLYCOL 3350 17 G PO PACK: 17.0000 g | PACK | Freq: Every day | ORAL | Status: DC | PRN

## 2021-03-25 MED ORDER — 0.9 % SODIUM CHLORIDE (POUR BTL) OPTIME
TOPICAL | Status: DC | PRN
  Administered 2021-03-25: 1000 mL

## 2021-03-25 MED ORDER — PROPOFOL 10 MG/ML IV BOLUS
INTRAVENOUS | Status: DC | PRN
  Administered 2021-03-25: 140 mg via INTRAVENOUS
  Administered 2021-03-25 (×2): 20 mg via INTRAVENOUS

## 2021-03-25 MED ORDER — METHOCARBAMOL 1000 MG/10ML IJ SOLN
500.0000 mg | Freq: Four times a day (QID) | INTRAVENOUS | Status: DC | PRN
  Filled 2021-03-25: qty 5

## 2021-03-25 MED ORDER — CHLORHEXIDINE GLUCONATE CLOTH 2 % EX PADS
6.0000 | MEDICATED_PAD | Freq: Every day | CUTANEOUS | Status: DC
  Administered 2021-03-26 – 2021-03-28 (×3): 6 via TOPICAL

## 2021-03-25 MED ORDER — HYDROXYZINE HCL 25 MG PO TABS: 25.0000 mg | ORAL_TABLET | Freq: Four times a day (QID) | ORAL | Status: DC | PRN

## 2021-03-25 MED ORDER — ACETAMINOPHEN 10 MG/ML IV SOLN
INTRAVENOUS | Status: DC | PRN
  Administered 2021-03-25: 1000 mg via INTRAVENOUS

## 2021-03-25 MED ORDER — BISACODYL 5 MG PO TBEC: 5.0000 mg | DELAYED_RELEASE_TABLET | Freq: Every day | ORAL | Status: DC | PRN

## 2021-03-25 MED ORDER — BUPIVACAINE HCL 0.5 % IJ SOLN
INTRAMUSCULAR | Status: DC | PRN
  Administered 2021-03-25: 20 mL

## 2021-03-25 MED ORDER — ONDANSETRON HCL 4 MG/2ML IJ SOLN
INTRAMUSCULAR | Status: AC
  Filled 2021-03-25: qty 2

## 2021-03-25 MED ORDER — OXYCODONE HCL ER 15 MG PO T12A
15.0000 mg | EXTENDED_RELEASE_TABLET | Freq: Two times a day (BID) | ORAL | Status: DC
  Administered 2021-03-25 – 2021-03-28 (×6): 15 mg via ORAL
  Filled 2021-03-25 (×6): qty 1

## 2021-03-25 MED ORDER — MORPHINE SULFATE (PF) 2 MG/ML IV SOLN: 1.0000 mg | INTRAVENOUS | Status: DC | PRN

## 2021-03-25 MED ORDER — OXYCODONE HCL 5 MG PO TABS: 5.0000 mg | ORAL_TABLET | ORAL | Status: DC | PRN

## 2021-03-25 MED ORDER — ALBUTEROL SULFATE (2.5 MG/3ML) 0.083% IN NEBU
2.5000 mg | INHALATION_SOLUTION | RESPIRATORY_TRACT | Status: DC
  Administered 2021-03-25 (×2): 2.5 mg via RESPIRATORY_TRACT
  Filled 2021-03-25 (×2): qty 3

## 2021-03-25 MED ORDER — TRANEXAMIC ACID-NACL 1000-0.7 MG/100ML-% IV SOLN
INTRAVENOUS | Status: AC
  Filled 2021-03-25: qty 100

## 2021-03-25 MED ORDER — ACETAMINOPHEN 10 MG/ML IV SOLN
INTRAVENOUS | Status: AC
  Filled 2021-03-25: qty 100

## 2021-03-25 MED ORDER — MENTHOL 3 MG MT LOZG: 1.0000 | LOZENGE | OROMUCOSAL | Status: DC | PRN

## 2021-03-25 MED ORDER — CHLORHEXIDINE GLUCONATE 0.12 % MT SOLN
15.0000 mL | Freq: Once | OROMUCOSAL | Status: AC
  Administered 2021-03-25: 15 mL via OROMUCOSAL
  Filled 2021-03-25: qty 15

## 2021-03-25 MED ORDER — METHOCARBAMOL 500 MG PO TABS
500.0000 mg | ORAL_TABLET | Freq: Four times a day (QID) | ORAL | Status: DC | PRN
  Administered 2021-03-28: 500 mg via ORAL
  Filled 2021-03-25: qty 1

## 2021-03-25 MED ORDER — PROPOFOL 10 MG/ML IV BOLUS
INTRAVENOUS | Status: AC
  Filled 2021-03-25: qty 20

## 2021-03-25 MED ORDER — LIDOCAINE 2% (20 MG/ML) 5 ML SYRINGE
INTRAMUSCULAR | Status: DC | PRN
  Administered 2021-03-25: 100 mg via INTRAVENOUS

## 2021-03-25 MED ORDER — AMISULPRIDE (ANTIEMETIC) 5 MG/2ML IV SOLN: 10.0000 mg | Freq: Once | INTRAVENOUS | Status: DC | PRN

## 2021-03-25 MED ORDER — MOMETASONE FURO-FORMOTEROL FUM 100-5 MCG/ACT IN AERO
2.0000 | INHALATION_SPRAY | Freq: Two times a day (BID) | RESPIRATORY_TRACT | Status: DC
Start: 2021-03-25 — End: 2021-03-28
  Administered 2021-03-25 – 2021-03-28 (×6): 2 via RESPIRATORY_TRACT
  Filled 2021-03-25: qty 8.8

## 2021-03-25 MED ORDER — MONTELUKAST SODIUM 10 MG PO TABS
10.0000 mg | ORAL_TABLET | Freq: Every day | ORAL | Status: DC
  Administered 2021-03-25 – 2021-03-27 (×3): 10 mg via ORAL
  Filled 2021-03-25 (×3): qty 1

## 2021-03-25 MED ORDER — PHENYLEPHRINE HCL-NACL 20-0.9 MG/250ML-% IV SOLN
INTRAVENOUS | Status: DC | PRN
  Administered 2021-03-25: 20 ug/min via INTRAVENOUS

## 2021-03-25 MED ORDER — INSULIN ASPART 100 UNIT/ML IJ SOLN: 0.0000 [IU] | INTRAMUSCULAR | Status: DC | PRN

## 2021-03-25 MED ORDER — DICYCLOMINE HCL 10 MG PO CAPS
10.0000 mg | ORAL_CAPSULE | Freq: Three times a day (TID) | ORAL | Status: DC
  Filled 2021-03-25 (×2): qty 1

## 2021-03-25 MED ORDER — INSULIN ASPART 100 UNIT/ML IJ SOLN
4.0000 [IU] | Freq: Once | INTRAMUSCULAR | Status: AC
  Administered 2021-03-25: 4 [IU] via SUBCUTANEOUS

## 2021-03-25 MED ORDER — LACTATED RINGERS IV SOLN: INTRAVENOUS | Status: DC

## 2021-03-25 MED ORDER — OFLOXACIN 0.3 % OP SOLN
1.0000 [drp] | Freq: Four times a day (QID) | OPHTHALMIC | Status: DC
  Administered 2021-03-25 – 2021-03-28 (×10): 1 [drp] via OPHTHALMIC
  Filled 2021-03-25: qty 5

## 2021-03-25 MED ORDER — ALBUTEROL SULFATE (2.5 MG/3ML) 0.083% IN NEBU: 2.5000 mg | INHALATION_SOLUTION | RESPIRATORY_TRACT | Status: DC | PRN

## 2021-03-25 MED ORDER — TRANEXAMIC ACID-NACL 1000-0.7 MG/100ML-% IV SOLN
INTRAVENOUS | Status: DC | PRN
  Administered 2021-03-25: 1000 mg via INTRAVENOUS

## 2021-03-25 MED ORDER — LACTATED RINGERS IV SOLN: INTRAVENOUS | Status: DC | PRN

## 2021-03-25 MED ORDER — THROMBIN 20000 UNITS EX SOLR
CUTANEOUS | Status: AC
  Filled 2021-03-25: qty 20000

## 2021-03-25 MED ORDER — OXYCODONE HCL 5 MG PO TABS
10.0000 mg | ORAL_TABLET | ORAL | Status: DC | PRN
  Administered 2021-03-25 – 2021-03-28 (×5): 10 mg via ORAL
  Filled 2021-03-25 (×5): qty 2

## 2021-03-25 MED ORDER — ACETAMINOPHEN 650 MG RE SUPP: 650.0000 mg | RECTAL | Status: DC | PRN

## 2021-03-25 MED ORDER — SUGAMMADEX SODIUM 200 MG/2ML IV SOLN
INTRAVENOUS | Status: DC | PRN
  Administered 2021-03-25: 200 mg via INTRAVENOUS

## 2021-03-25 MED ORDER — OXYCODONE HCL 5 MG/5ML PO SOLN: 5.0000 mg | Freq: Once | ORAL | Status: DC | PRN

## 2021-03-25 MED ORDER — ROCURONIUM BROMIDE 10 MG/ML (PF) SYRINGE
PREFILLED_SYRINGE | INTRAVENOUS | Status: AC
  Filled 2021-03-25: qty 20

## 2021-03-25 MED ORDER — BUPIVACAINE HCL (PF) 0.5 % IJ SOLN
INTRAMUSCULAR | Status: AC
  Filled 2021-03-25: qty 30

## 2021-03-25 MED ORDER — BUPIVACAINE LIPOSOME 1.3 % IJ SUSP
INTRAMUSCULAR | Status: DC | PRN
Start: 2021-03-25 — End: 2021-03-25
  Administered 2021-03-25: 20 mL

## 2021-03-25 MED ORDER — ONDANSETRON HCL 4 MG/2ML IJ SOLN
INTRAMUSCULAR | Status: DC | PRN
  Administered 2021-03-25: 4 mg via INTRAVENOUS

## 2021-03-25 MED ORDER — PHENOL 1.4 % MT LIQD: 1.0000 | OROMUCOSAL | Status: DC | PRN

## 2021-03-25 MED ORDER — DICYCLOMINE HCL 20 MG PO TABS
10.0000 mg | ORAL_TABLET | Freq: Three times a day (TID) | ORAL | Status: DC
Start: 2021-03-25 — End: 2021-03-28
  Administered 2021-03-25 – 2021-03-28 (×10): 10 mg via ORAL
  Filled 2021-03-25 (×13): qty 1

## 2021-03-25 MED ORDER — FLUTICASONE PROPIONATE 50 MCG/ACT NA SUSP
1.0000 | Freq: Every day | NASAL | Status: DC
  Administered 2021-03-26 – 2021-03-28 (×3): 1 via NASAL
  Filled 2021-03-25: qty 16

## 2021-03-25 MED ORDER — OXYCODONE HCL 5 MG PO TABS: 5.0000 mg | ORAL_TABLET | Freq: Once | ORAL | Status: DC | PRN

## 2021-03-25 MED ORDER — GABAPENTIN 300 MG PO CAPS
600.0000 mg | ORAL_CAPSULE | Freq: Two times a day (BID) | ORAL | Status: DC
  Administered 2021-03-25 – 2021-03-28 (×6): 600 mg via ORAL
  Filled 2021-03-25 (×6): qty 2

## 2021-03-25 MED ORDER — FENTANYL CITRATE (PF) 250 MCG/5ML IJ SOLN
INTRAMUSCULAR | Status: DC | PRN
  Administered 2021-03-25: 25 ug via INTRAVENOUS
  Administered 2021-03-25: 50 ug via INTRAVENOUS
  Administered 2021-03-25: 25 ug via INTRAVENOUS
  Administered 2021-03-25: 75 ug via INTRAVENOUS
  Administered 2021-03-25 (×2): 50 ug via INTRAVENOUS
  Administered 2021-03-25: 25 ug via INTRAVENOUS

## 2021-03-25 MED ORDER — KETAMINE HCL 50 MG/5ML IJ SOSY
PREFILLED_SYRINGE | INTRAMUSCULAR | Status: AC
  Filled 2021-03-25: qty 10

## 2021-03-25 MED ORDER — LIDOCAINE 2% (20 MG/ML) 5 ML SYRINGE
INTRAMUSCULAR | Status: AC
  Filled 2021-03-25: qty 5

## 2021-03-25 MED ORDER — FENTANYL CITRATE (PF) 250 MCG/5ML IJ SOLN
INTRAMUSCULAR | Status: AC
  Filled 2021-03-25: qty 5

## 2021-03-25 MED ORDER — PHENYLEPHRINE 40 MCG/ML (10ML) SYRINGE FOR IV PUSH (FOR BLOOD PRESSURE SUPPORT)
PREFILLED_SYRINGE | INTRAVENOUS | Status: DC | PRN
Start: 2021-03-25 — End: 2021-03-25
  Administered 2021-03-25: 80 ug via INTRAVENOUS
  Administered 2021-03-25: 40 ug via INTRAVENOUS
  Administered 2021-03-25 (×2): 80 ug via INTRAVENOUS
  Administered 2021-03-25: 120 ug via INTRAVENOUS

## 2021-03-25 MED ORDER — BUPIVACAINE LIPOSOME 1.3 % IJ SUSP
INTRAMUSCULAR | Status: AC
  Filled 2021-03-25: qty 20

## 2021-03-25 MED ORDER — SODIUM CHLORIDE 0.9% FLUSH
3.0000 mL | Freq: Two times a day (BID) | INTRAVENOUS | Status: DC
  Administered 2021-03-25 – 2021-03-28 (×6): 3 mL via INTRAVENOUS

## 2021-03-25 MED ORDER — TRIAMCINOLONE ACETONIDE 0.1 % EX CREA
1.0000 | TOPICAL_CREAM | Freq: Two times a day (BID) | CUTANEOUS | Status: DC
  Administered 2021-03-26 – 2021-03-28 (×2): 1 via TOPICAL
  Filled 2021-03-25: qty 15

## 2021-03-25 MED ORDER — MIDAZOLAM HCL 2 MG/2ML IJ SOLN
INTRAMUSCULAR | Status: AC
  Filled 2021-03-25: qty 2

## 2021-03-25 MED ORDER — MUPIROCIN 2 % EX OINT
TOPICAL_OINTMENT | CUTANEOUS | Status: AC
  Administered 2021-03-25: 1 via TOPICAL
  Filled 2021-03-25: qty 22

## 2021-03-25 MED ORDER — METOCLOPRAMIDE HCL 10 MG PO TABS: 5.0000 mg | ORAL_TABLET | Freq: Three times a day (TID) | ORAL | Status: DC | PRN

## 2021-03-25 MED ORDER — DEXAMETHASONE SODIUM PHOSPHATE 10 MG/ML IJ SOLN
INTRAMUSCULAR | Status: AC
  Filled 2021-03-25: qty 1

## 2021-03-25 MED ORDER — DOCUSATE SODIUM 100 MG PO CAPS
100.0000 mg | ORAL_CAPSULE | Freq: Two times a day (BID) | ORAL | Status: DC
  Administered 2021-03-25 – 2021-03-28 (×6): 100 mg via ORAL
  Filled 2021-03-25 (×6): qty 1

## 2021-03-25 MED ORDER — SODIUM CHLORIDE 0.9% FLUSH: 3.0000 mL | INTRAVENOUS | Status: DC | PRN

## 2021-03-25 MED ORDER — NICOTINE 14 MG/24HR TD PT24
14.0000 mg | MEDICATED_PATCH | Freq: Every day | TRANSDERMAL | Status: DC
  Administered 2021-03-25 – 2021-03-28 (×4): 14 mg via TRANSDERMAL
  Filled 2021-03-25 (×4): qty 1

## 2021-03-25 MED ORDER — LOPERAMIDE HCL 2 MG PO CAPS: 2.0000 mg | ORAL_CAPSULE | ORAL | Status: DC | PRN

## 2021-03-25 MED ORDER — ACETAMINOPHEN 325 MG PO TABS: 650.0000 mg | ORAL_TABLET | ORAL | Status: DC | PRN

## 2021-03-25 MED ORDER — KETAMINE HCL 10 MG/ML IJ SOLN
INTRAMUSCULAR | Status: DC | PRN
  Administered 2021-03-25 (×2): 10 mg via INTRAVENOUS
  Administered 2021-03-25: 30 mg via INTRAVENOUS
  Administered 2021-03-25: 10 mg via INTRAVENOUS

## 2021-03-25 MED ORDER — IRBESARTAN 150 MG PO TABS
150.0000 mg | ORAL_TABLET | Freq: Every day | ORAL | Status: DC
  Administered 2021-03-25 – 2021-03-28 (×4): 150 mg via ORAL
  Filled 2021-03-25 (×4): qty 1

## 2021-03-25 MED ORDER — ONDANSETRON HCL 4 MG/2ML IJ SOLN: 4.0000 mg | Freq: Once | INTRAMUSCULAR | Status: DC | PRN

## 2021-03-25 MED ORDER — LORATADINE 10 MG PO TABS
10.0000 mg | ORAL_TABLET | Freq: Every day | ORAL | Status: DC
  Administered 2021-03-25 – 2021-03-28 (×4): 10 mg via ORAL
  Filled 2021-03-25 (×4): qty 1

## 2021-03-25 MED ORDER — MIDAZOLAM HCL 2 MG/2ML IJ SOLN
INTRAMUSCULAR | Status: DC | PRN
  Administered 2021-03-25: 2 mg via INTRAVENOUS

## 2021-03-25 MED ORDER — SODIUM CHLORIDE (PF) 0.9 % IJ SOLN
INTRAMUSCULAR | Status: AC
  Filled 2021-03-25: qty 40

## 2021-03-25 MED ORDER — ONDANSETRON HCL 4 MG/2ML IJ SOLN: 4.0000 mg | Freq: Four times a day (QID) | INTRAMUSCULAR | Status: DC | PRN

## 2021-03-25 MED ORDER — HYDROCHLOROTHIAZIDE 12.5 MG PO TABS
12.5000 mg | ORAL_TABLET | Freq: Every day | ORAL | Status: DC
  Administered 2021-03-25 – 2021-03-28 (×4): 12.5 mg via ORAL
  Filled 2021-03-25 (×4): qty 1

## 2021-03-25 MED ORDER — SUCRALFATE 1 G PO TABS
1.0000 g | ORAL_TABLET | Freq: Three times a day (TID) | ORAL | Status: DC
  Administered 2021-03-25 – 2021-03-28 (×12): 1 g via ORAL
  Filled 2021-03-25 (×14): qty 1

## 2021-03-25 MED ORDER — FLEET ENEMA 7-19 GM/118ML RE ENEM: 1.0000 | ENEMA | Freq: Once | RECTAL | Status: DC | PRN

## 2021-03-25 MED ORDER — PANTOPRAZOLE SODIUM 40 MG IV SOLR
40.0000 mg | Freq: Every day | INTRAVENOUS | Status: DC
  Administered 2021-03-25: 40 mg via INTRAVENOUS
  Filled 2021-03-25: qty 10

## 2021-03-25 MED ORDER — DULOXETINE HCL 20 MG PO CPEP
20.0000 mg | ORAL_CAPSULE | Freq: Two times a day (BID) | ORAL | Status: DC
  Administered 2021-03-25 – 2021-03-28 (×6): 20 mg via ORAL
  Filled 2021-03-25 (×7): qty 1

## 2021-03-25 MED ORDER — UMECLIDINIUM BROMIDE 62.5 MCG/ACT IN AEPB
1.0000 | INHALATION_SPRAY | Freq: Every day | RESPIRATORY_TRACT | Status: DC
  Administered 2021-03-26 – 2021-03-28 (×3): 1 via RESPIRATORY_TRACT
  Filled 2021-03-25: qty 7

## 2021-03-25 SURGICAL SUPPLY — 82 items
BAG COUNTER SPONGE SURGICOUNT (BAG) ×2 IMPLANT
BIT DRILL NEURO 2X3.1 SFT TUCH (MISCELLANEOUS) IMPLANT
BLADE CLIPPER SURG (BLADE) IMPLANT
BONE CANC CHIPS 20CC PCAN1/4 (Bone Implant) ×2 IMPLANT
BUR MATCHSTICK NEURO 3.0 LAGG (BURR) ×2 IMPLANT
BUR NEURO DRILL SOFT 3.0X3.8M (BURR) IMPLANT
BUR PRESCISION 1.7 ELITE (BURR) IMPLANT
BUR RND FLUTED 2.5 (BURR) IMPLANT
CAGE SABLE 10X30 6-12 8D (Cage) ×2 IMPLANT
CAGE SABLE 10X30 7-14 15D (Cage) ×1 IMPLANT
CANNULA GRAFT BNE VG PRE-FILL (Bone Implant) IMPLANT
CAP LOCKING THREADED (Cap) ×8 IMPLANT
CHIPS CANC BONE 20CC PCAN1/4 (Bone Implant) ×1 IMPLANT
COVER BACK TABLE 80X110 HD (DRAPES) ×2 IMPLANT
COVER MAYO STAND STRL (DRAPES) IMPLANT
COVER SURGICAL LIGHT HANDLE (MISCELLANEOUS) ×2 IMPLANT
DERMABOND ADVANCED (GAUZE/BANDAGES/DRESSINGS) ×1
DERMABOND ADVANCED .7 DNX12 (GAUZE/BANDAGES/DRESSINGS) ×1 IMPLANT
DRAPE C-ARM 42X72 X-RAY (DRAPES) ×2 IMPLANT
DRAPE C-ARMOR (DRAPES) ×2 IMPLANT
DRAPE MICROSCOPE LEICA (MISCELLANEOUS) ×2 IMPLANT
DRAPE POUCH INSTRU U-SHP 10X18 (DRAPES) ×2 IMPLANT
DRAPE SURG 17X23 STRL (DRAPES) ×8 IMPLANT
DRILL NEURO 2X3.1 SOFT TOUCH (MISCELLANEOUS) ×2
DRIVER RIGID THRD QC 1/4 (ORTHOPEDIC DISPOSABLE SUPPLIES) ×1 IMPLANT
DRSG MEPILEX BORDER 4X8 (GAUZE/BANDAGES/DRESSINGS) ×2 IMPLANT
DURAPREP 26ML APPLICATOR (WOUND CARE) ×2 IMPLANT
ELECT BLADE 4.0 EZ CLEAN MEGAD (MISCELLANEOUS) ×2
ELECT BLADE 6.5 EXT (BLADE) IMPLANT
ELECT CAUTERY BLADE 6.4 (BLADE) ×2 IMPLANT
ELECT REM PT RETURN 9FT ADLT (ELECTROSURGICAL) ×2
ELECTRODE BLDE 4.0 EZ CLN MEGD (MISCELLANEOUS) ×1 IMPLANT
ELECTRODE REM PT RTRN 9FT ADLT (ELECTROSURGICAL) ×1 IMPLANT
EVACUATOR 1/8 PVC DRAIN (DRAIN) IMPLANT
GLOVE SRG 8 PF TXTR STRL LF DI (GLOVE) ×1 IMPLANT
GLOVE SURG 8.5 LATEX PF (GLOVE) ×2 IMPLANT
GLOVE SURG LTX SZ9 (GLOVE) ×2 IMPLANT
GLOVE SURG ORTHO LTX SZ7.5 (GLOVE) ×2 IMPLANT
GLOVE SURG UNDER POLY LF SZ8 (GLOVE) ×2
GOWN STRL REUS W/ TWL LRG LVL3 (GOWN DISPOSABLE) ×1 IMPLANT
GOWN STRL REUS W/TWL 2XL LVL3 (GOWN DISPOSABLE) ×4 IMPLANT
GOWN STRL REUS W/TWL LRG LVL3 (GOWN DISPOSABLE) ×2
GRAFT BNE CANC CHIPS 1-8 20CC (Bone Implant) IMPLANT
GRAFT BONE CANNULA VIVIGEN 3 (Bone Implant) ×18 IMPLANT
KIT BASIN OR (CUSTOM PROCEDURE TRAY) ×2 IMPLANT
KIT POSITION SURG JACKSON T1 (MISCELLANEOUS) ×2 IMPLANT
KIT TURNOVER KIT B (KITS) ×2 IMPLANT
NDL SPNL 18GX3.5 QUINCKE PK (NEEDLE) ×1 IMPLANT
NEEDLE 22X1 1/2 (OR ONLY) (NEEDLE) ×2 IMPLANT
NEEDLE SPNL 18GX3.5 QUINCKE PK (NEEDLE) ×2 IMPLANT
NS IRRIG 1000ML POUR BTL (IV SOLUTION) ×2 IMPLANT
PACK LAMINECTOMY ORTHO (CUSTOM PROCEDURE TRAY) ×2 IMPLANT
PAD ARMBOARD 7.5X6 YLW CONV (MISCELLANEOUS) ×4 IMPLANT
PATTIES SURGICAL .75X.75 (GAUZE/BANDAGES/DRESSINGS) ×2 IMPLANT
PATTIES SURGICAL 1X1 (DISPOSABLE) ×2 IMPLANT
PUSHER GRAFT GLBU (ORTHOPEDIC DISPOSABLE SUPPLIES) ×2 IMPLANT
ROD CREO 100MM SPINAL (Rod) ×1 IMPLANT
ROD SPINAL CREO 95MM (Rod) ×1 IMPLANT
SCREW CREO ONE ROB MOD 7.5X55 (Screw) ×5 IMPLANT
SCREW MOD SD CREO 7.5X50 (Screw) ×3 IMPLANT
SCREW PA THRD CREO TULIP 5.5X4 (Head) ×8 IMPLANT
SPONGE SURGIFOAM ABS GEL 100 (HEMOSTASIS) ×2 IMPLANT
SPONGE T-LAP 4X18 ~~LOC~~+RFID (SPONGE) ×2 IMPLANT
SURGIFLO W/THROMBIN 8M KIT (HEMOSTASIS) ×2 IMPLANT
SUT VIC AB 0 CT1 27 (SUTURE) ×2
SUT VIC AB 0 CT1 27XBRD ANBCTR (SUTURE) ×1 IMPLANT
SUT VIC AB 1 CTX 36 (SUTURE) ×2
SUT VIC AB 1 CTX36XBRD ANBCTR (SUTURE) ×1 IMPLANT
SUT VIC AB 2-0 CT1 27 (SUTURE) ×2
SUT VIC AB 2-0 CT1 TAPERPNT 27 (SUTURE) ×1 IMPLANT
SUT VIC AB 3-0 X1 27 (SUTURE) ×2 IMPLANT
SYR 20ML LL LF (SYRINGE) ×2 IMPLANT
SYR CONTROL 10ML LL (SYRINGE) ×4 IMPLANT
TAP BONE CORT 5.0/4.0 (BIT) ×1 IMPLANT
TAP BONE CORT 5.5/4.5 (TAP) ×1 IMPLANT
TAP CORT GLBU 6.5/5.5 (TAP) ×1 IMPLANT
TAP SURG BONE 6.0/5.0 (ORTHOPEDIC DISPOSABLE SUPPLIES) ×2 IMPLANT
TOWEL GREEN STERILE (TOWEL DISPOSABLE) ×2 IMPLANT
TOWEL GREEN STERILE FF (TOWEL DISPOSABLE) ×2 IMPLANT
TRAY FOLEY MTR SLVR 16FR STAT (SET/KITS/TRAYS/PACK) ×2 IMPLANT
WATER STERILE IRR 1000ML POUR (IV SOLUTION) ×2 IMPLANT
YANKAUER SUCT BULB TIP NO VENT (SUCTIONS) ×2 IMPLANT

## 2021-03-25 NOTE — H&P (Signed)
Office Visit Note              Patient: Stacy [name]                                          Date of Birth: 03/17/1960                                                    MRN: [id number] Visit Date: 03/19/2021                                                                     Requested by: [name], MD [address] [place],  [place] [address] PCP: [name], MD     Assessment & Plan: Visit Diagnoses:  1. Spondylolisthesis, lumbar region   2. Spinal stenosis, lumbar region, with neurogenic claudication   L2-L5 stenosis   Plan: We will proceed with LEFT L2-3, L3-4, L4-5 TRANSFORAMINAL LUMBAR INTERBODY FUSIONS WITH RODS, SCREWS AND CAGES, LOCAL BONE GRAFT, ALLOGRAFT BONE GRAFT, VIVIGEN as scheduled.  Surgical procedure discussed along with potential hospital stay.  Stressed to patient the importance of cutting back on her smoking due to to the increased risk of pseudoarthrosis.  All questions answered.   Follow-Up Instructions: Return for Return office visit 2 weeks postop with Dr. [name].    Orders:  No orders of the defined types were placed in this encounter.   No orders of the defined types were placed in this encounter.        Procedures: No procedures performed     Clinical Data: No additional findings.     Subjective:     Chief Complaint  Patient presents with   Lower Back - Pain      H&P 03/25/2021 Left L2-3, L3-4, L4-5 TLIF-Nitka      HPI 61 year old white female history of L2-3, L3-4, L4-5 stenosis comes in for prep evaluation.  States that symptoms unchanged in previous visit.  She is wanting to proceed with LEFT L2-3, L3-4, L4-5 TRANSFORAMINAL LUMBAR INTERBODY FUSIONS WITH RODS, SCREWS AND CAGES, LOCAL BONE GRAFT, ALLOGRAFT BONE GRAFT, VIVIGEN.  Today history and physical performed.  Review of systems negative.  We have received preop medical/cardiac clearance from Dr. Karle Plumber January 20, 2021. Review of Systems No  current cardiopulmonary GI/GU issues   Objective: Vital Signs: BP 124/76    Pulse (!) 103    Ht 5\' 6"  (1.676 m)    Wt 156 lb (70.8 kg)    LMP 09/01/2010    BMI 25.18 kg/m    Physical Exam HENT:     Head: Normocephalic and atraumatic.     Nose: Nose normal.  Eyes:     Extraocular Movements: Extraocular movements intact.  Cardiovascular:     Rate and Rhythm: Regular rhythm. Tachycardia present.  Pulmonary:     Effort: Pulmonary effort is normal.     Breath sounds: Wheezing (Few scattered) present.  Abdominal:     General:  Incidental lung nodule, > 3mm and < 8mm 08/10/2019  ° OSA on CPAP 08/10/2019  ° Dyspnea on exertion 08/02/2019  ° Hyperlipidemia 08/02/2019  ° Lumbar radiculopathy 04/24/2019  ° 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  ° Post laminectomy syndrome 02/23/2019  ° Abnormality of gait 02/23/2019  ° 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  ° Unilateral primary osteoarthritis, left hip 12/28/2018  ° Lesion of skin of left ear 12/26/2018  ° Primary osteoarthritis of left hip 12/02/2018  ° Iron deficiency anemia 10/16/2018  ° Chronic pain syndrome 09/08/2018  ° Chronic pain of right knee 08/11/2018  ° Status post lumbar laminectomy 07/15/2018  ° Peripheral arterial disease  (HCC) 04/05/2018  ° Moderate persistent asthma without complication 06/29/2017  ° Environmental and seasonal allergies 06/29/2017  ° Controlled type 2 diabetes mellitus with diabetic polyneuropathy, without long-term current use of insulin (HCC) 06/29/2017  ° Perennial allergic rhinitis 04/08/2017  ° Sensorineural hearing loss (SNHL), bilateral 04/08/2017  ° Chronic pansinusitis 03/25/2017  ° Eustachian tube dysfunction, bilateral 03/25/2017  ° 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  ° Herniation of lumbar intervertebral disc with radiculopathy 10/02/2016  °  Class: Chronic  ° Alopecia areata 08/19/2016  ° Chondromalacia of both patellae 06/03/2015  °  Class: Chronic  ° Spinal stenosis, lumbar region, with neurogenic claudication 06/03/2015  ° Tobacco use disorder 04/25/2015  ° DJD (degenerative joint disease) of knee 01/04/2015  ° Hemorrhoid 11/14/2014  ° Gout of big toe 07/19/2014  ° Essential hypertension 08/14/2013  ° Gastroesophageal reflux disease without esophagitis 08/14/2013  ° COPD (chronic obstructive pulmonary disease) (HCC) 04/17/2011  ° °Past Medical History:  °Diagnosis Date  ° Allergy   ° Shellfish, cleaning products  ° Anxiety   ° Arthritis   ° Arthrofibrosis of total knee replacement (HCC)   ° right  ° Asthma   ° COPD (chronic obstructive pulmonary disease) (HCC)   ° Depression   ° Diabetes mellitus   ° Type II  ° GERD (gastroesophageal reflux disease)   ° Pt on Protonix daily  ° Glaucoma   ° Gout   ° Headache(784.0)   ° otc meds prn  ° Hyperlipidemia   ° Hypertension   ° Irritable bowel syndrome 11/19/2010  ° Neuropathy   ° Pneumonia YRS AGO  ° Restless legs   ° Shortness of breath   ° 07/14/2018- uses  4 times a day  °  °Family History  °Problem Relation Age of Onset  ° Hypertension Father   ° Cancer Father   ° Heart disease Mother   ° Asthma Son   °     had as a child  ° Heart disease Sister   ° Breast cancer Sister   ° Hypertension Brother   °  °Past Surgical History:  °Procedure Laterality Date  ° BACK  SURGERY    ° CHOLECYSTECTOMY    ° COLONOSCOPY    ° ENDOMETRIAL ABLATION  10/2010  ° EYE SURGERY    ° HERNIA REPAIR    ° umbicial hernia  ° JOINT REPLACEMENT Left 03/14/2019  ° Dr. Blackman hip  ° KNEE ARTHROSCOPY Left   ° 06/07/2017 Dr. Dean of [facility]  ° KNEE ARTHROSCOPY Left 03/21/2020  ° Procedure: LEFT KNEE ARTHROSCOPY WITH PARTIAL MEDIAL MENISCECTOMY;  Surgeon: Blackman, Christopher Y, MD;  Location: [facility];  Service: Orthopedics;  Laterality: Left;  ° KNEE CLOSED REDUCTION Right 12/06/2015  ° Procedure: CLOSED MANIPULATION RIGHT KNEE;  Surgeon: Delvis Kau E Nitka, MD;  Location: [facility];    Shortness of breath      07/14/2018- uses  4 times a day         Family History  Problem Relation Age of Onset   Hypertension  Father     Cancer Father     Heart disease Mother     Asthma Son          had as a child   Heart disease Sister     Breast cancer Sister     Hypertension Brother           Past Surgical History:  Procedure Laterality Date   BACK SURGERY       CHOLECYSTECTOMY       COLONOSCOPY       ENDOMETRIAL ABLATION   10/2010   EYE SURGERY       HERNIA REPAIR        umbicial hernia   JOINT REPLACEMENT Left 03/14/2019    Dr. Ninfa Linden hip   KNEE ARTHROSCOPY Left      06/07/2017 Dr. Marlou Sa of [place] ARTHROSCOPY Left 03/21/2020    Procedure: LEFT KNEE ARTHROSCOPY WITH PARTIAL MEDIAL MENISCECTOMY;  Surgeon: Mcarthur Rossetti, MD;  Location: [place];  Service: Orthopedics;  Laterality: Left;   KNEE CLOSED REDUCTION Right 12/06/2015    Procedure: CLOSED MANIPULATION RIGHT KNEE;  Surgeon: [name], MD;  Location: [place];  Service: Orthopedics;  Laterality: Right;   KNEE CLOSED REDUCTION Right 01/17/2016    Procedure: CLOSED MANIPULATION RIGHT KNEE;  Surgeon: [name], MD;  Location: [place];  Service: Orthopedics;  Laterality: Right;   KNEE JOINT MANIPULATION Right 12/06/2015   LACRIMAL TUBE INSERTION Bilateral 03/01/2019    Procedure: LACRIMAL TUBE INSERTION;  Surgeon: Gevena Cotton, MD;  Location[facility];  Service: Ophthalmology;  Laterality: Bilateral;   LACRIMAL TUBE REMOVAL Bilateral 05/03/2019    Procedure: BILATERAL NASOLACRIMAL DUCT PROBING, IIRIGATION AND TUBE REMOVAL BOTH EYES;  Surgeon: Gevena Cotton, MD;  Location: [place];  Service: Ophthalmology;  Laterality: Bilateral;   LUMBAR DISC SURGERY   06/03/2015    L 2  L3 L4 L5    LUMBAR LAMINECTOMY/DECOMPRESSION MICRODISCECTOMY N/A 06/03/2015    Procedure: Bilateral lateral recess decompression L2-3, L3-4, L4-5;  Surgeon: [name], MD;  Location: [place];  Service: Orthopedics;  Laterality: N/A;   LUMBAR LAMINECTOMY/DECOMPRESSION MICRODISCECTOMY N/A 10/02/2016     Procedure: Right L5-S1 Lateral Recess Decompression  microdiscectomy;  Surgeon: [name], MD;  Location: [place];  Service: Orthopedics;  Laterality: N/A;   LUMBAR LAMINECTOMY/DECOMPRESSION MICRODISCECTOMY N/A 07/15/2018    Procedure: LEFT L3-4 MICRODISCECTOMY;  Surgeon: [name], MD;  Location: [place];  Service: Orthopedics;  Laterality: N/A;   svd         x 2   TEAR DUCT PROBING Bilateral 03/01/2019    Procedure: TEAR DUCT PROBING WITH IRRIGATION;  Surgeon: Gevena Cotton, MD;  Location: [facility];  Service: Ophthalmology;  Laterality: Bilateral;   TOTAL HIP ARTHROPLASTY Left 03/14/2019    Procedure: LEFT TOTAL HIP ARTHROPLASTY ANTERIOR APPROACH;  Surgeon: Mcarthur Rossetti, MD;  Location: [place];  Service: Orthopedics;  Laterality: Left;   TOTAL KNEE ARTHROPLASTY Right 09/06/2015    Procedure: RIGHT TOTAL KNEE ARTHROPLASTY;  Surgeon: [name], MD;  Location: [place];  Service: Orthopedics;  Laterality: Right;   TUBAL LIGATION       UPPER GASTROINTESTINAL ENDOSCOPY   04/28/11  Social History         Occupational History   Occupation: unemployed      Fish farm manager: UNEMPLOYED  Tobacco Use   Smoking status: Some Days      Packs/day: 0.25      Years: 32.00      Pack years: 8.00      Types: Cigarettes   Smokeless tobacco: Never   Tobacco comments:      DOWN TO 3 PER DAY  Vaping Use   Vaping Use: Never used  Substance and Sexual Activity   Alcohol use: No   Drug use: No   Sexual activity: Yes      Birth control/protection: Surgical, Post-menopausal      Comment: tubal ligation

## 2021-03-25 NOTE — Brief Op Note (Signed)
03/25/2021  2:02 PM  PATIENT:  Stacy [name]  61 y.o. female  PRE-OPERATIVE DIAGNOSIS:  left L3-4, L4-5 spondylosis, L2-3 spondylolisthesis, degenerative scoliosis  POST-OPERATIVE DIAGNOSIS:  left L3-4, L4-5 spondylosis, L2-3 spondylolisthesis, degenerative scoliosis  PROCEDURE:  Procedure(s): LEFT L2-3, L3-4, L4-5 TRANSFORAMINAL LUMBAR INTERBODY FUSIONS WITH RODS, SCREWS AND CAGES, LOCAL BONE GRAFT, ALLOGRAFT BONE GRAFT, VIVIGEN (N/A)  SURGEON:  Surgeon(s) and Role:    * Kerrin Champagne, MD - Primary  PHYSICIAN ASSISTANT: Zonia Kief, PA-C   ANESTHESIA:   local and general  EBL:  350 mL   BLOOD ADMINISTERED: 150 CC CELLSAVER  DRAINS: Urinary Catheter (Foley)   LOCAL MEDICATIONS USED:  MARCAINE 0.5% 1:1 EXPAREL 1.3%  Amount: 20 ml  SPECIMEN:  No Specimen  DISPOSITION OF SPECIMEN:  N/A  COUNTS:  YES  TOURNIQUET:  * No tourniquets in log *  DICTATION: .Dragon Dictation  PLAN OF CARE: Admit to inpatient   PATIENT DISPOSITION:  PACU - hemodynamically stable.   Delay start of Pharmacological VTE agent (>24hrs) due to surgical blood loss or risk of bleeding: yes

## 2021-03-25 NOTE — Progress Notes (Signed)
Orthopedic Tech Progress Note Patient Details:  Felica Chargois [facility] Feb 16, 1960 [id number]  PACU RN called requesting a  LSO BRACE for patient   Ortho Devices Type of Ortho Device: Lumbar corsett Ortho Device/Splint Location: BACK Ortho Device/Splint Interventions: Ordered   Post Interventions Patient Tolerated: Well Instructions Provided: Care of device  Donald Pore 03/25/2021, 3:11 PM

## 2021-03-25 NOTE — Op Note (Incomplete)
03/25/2021  2:08 PM  PATIENT:  Stacy [name]  61 y.o. female  MRN: [id number]  OPERATIVE REPORT  PRE-OPERATIVE DIAGNOSIS:  left L3-4, L4-5 spondylosis, L2-3 spondylolisthesis, degenerative scoliosis  POST-OPERATIVE DIAGNOSIS:  left L3-4, L4-5 spondylosis, L2-3 spondylolisthesis, degenerative scoliosis  PROCEDURE:  Procedure(s): LEFT L2-3, L3-4, L4-5 TRANSFORAMINAL LUMBAR INTERBODY FUSIONS WITH RODS, SCREWS AND CAGES, LOCAL BONE GRAFT, ALLOGRAFT BONE GRAFT, VIVIGEN    SURGEON:  [name], MD     ASSISTANT:  Benjiman Core, PA-C  (Present throughout the entire procedure and necessary for completion of procedure in a timely manner)     ANESTHESIA:  General, supplemented with local marcaine 0.5% 1:1 exparel 1.3% total 72ZD, Dr.     COMPLICATIONS:  None.   EBL: 350cc  CELL SAVER BLOOD RETURNED: 150cc  DRAINS: Foley to SD.     COMPONENTS:  Implant Name Type Inv. Item Serial No. Manufacturer Lot No. LRB No. Used Action  GRAFT BONE CANNULA VIVIGEN 3 - [id number] Bone Implant GRAFT BONE CANNULA VIVIGEN 3 [id number] [employer]  N/A 1 Implanted  SCREW MOD SD CREO 7.5X50 - [id number] Screw SCREW MOD SD CREO 7.5X50  [employer]  N/A 3 Implanted  SCREW CREO ONE ROB MOD 7.5X55 - [id number] Screw SCREW CREO ONE ROB MOD 7.5X55  [employer]  N/A 5 Implanted  SCREW PA THRD CREO TULIP 5.5X4 - [id number] Head SCREW PA THRD CREO TULIP 5.5X4  [employer]  N/A 8 Implanted  CAP LOCKING THREADED - [id number] Cap CAP LOCKING THREADED  [employer]  N/A 8 Implanted  CAGE SABLE 10X30 7-14 15D - [id number] Cage CAGE SABLE 10X30 7-14 15D  [employer]  N/A 1 Implanted  CAGE SABLE 10X30 6-12 8D - [id number] Cage CAGE SABLE 10X30 6-12 8D  [employer]  N/A 2 Implanted  BONE [facility] CHIPS [facility] PCAN1/4 - [id number] Bone Implant BONE [facility] CHIPS 20CC PCAN1/4 [id number] [employer]  N/A 1 Implanted  GRAFT BONE CANNULA VIVIGEN 3 - [id number] Bone Implant GRAFT BONE CANNULA  VIVIGEN 3 [id number] [employer]  N/A 1 Implanted  GRAFT BONE CANNULA VIVIGEN 3 - [id number] Bone Implant GRAFT BONE CANNULA VIVIGEN 3 [id number] [employer]  N/A 1 Implanted  GRAFT BONE CANNULA VIVIGEN 3 - [id number] Bone Implant GRAFT BONE CANNULA VIVIGEN 3 [id number] [employer]  N/A 1 Implanted  GRAFT BONE CANNULA VIVIGEN 3 - [id number] Bone Implant GRAFT BONE CANNULA VIVIGEN 3 [id number] [employer]  N/A 1 Implanted  GRAFT BONE CANNULA VIVIGEN 3 - [id number] Bone Implant GRAFT BONE CANNULA VIVIGEN 3 [id number] [employer]  N/A 1 Implanted  GRAFT BONE CANNULA VIVIGEN 3 - [id number] Bone Implant GRAFT BONE CANNULA VIVIGEN 3 [id number] [employer]  N/A 1 Implanted  GRAFT BONE CANNULA VIVIGEN 3 - [id number] Bone Implant GRAFT BONE CANNULA VIVIGEN 3 [id number] [employer]  N/A 1 Implanted  GRAFT BONE CANNULA VIVIGEN 3 - [id number] Bone Implant GRAFT BONE CANNULA VIVIGEN 3 [id number] [employer]  N/A 1 Implanted  ROD SPINAL CREO 95MM - [id number] Rod ROD SPINAL CREO 95MM  [employer]  N/A 1 Implanted  ROD CREO 100MM SPINAL - [id number] Rod ROD CREO 100MM SPINAL  [employer]  N/A 1 Implanted     PROCEDURE: The patient was met in the holding area, and the appropriate lumbar levels identified and marked with an "X" and my initials. I had discussion with the patient in the preop holding area regarding a change of consent form.The fusion levels are reidentified as L2-3, L3-4 and L4-5.  This means extension of the fusion upwards to the L2-3 (Stable vertebrae level)Patient understands the rationale to perform TLIFs at three levels to decompress the left L2-3, L3-4 and L4-5 lateral recess and foramenal stenosis and to allow for some improved correction of her overall kyphoscoliosis curves. The L1-2 disc is normal on MRI and correction of the deformity from L2-L5 should  Eliminate the need to fuse to the L1 level.  The patient was then transported  to OR and was placed under general anestheticwithout difficulty. The patient received appropriate preoperative antibiotic prophylaxis Ancef.  Nursing staff inserted a Foley catheter under sterile conditions. The patient was then turned to a prone position using the Drayton spine frame. PAS. all pressure points well padded the arms at the side to 90 90. Standard prep with DuraPrep solution draped in the usual manner from the lower dorsal spine the mid sacral segment. Iodine Vi-Drape was used and the old incision scar was marked. Time-out procedure was called and correct. Skin in the midline between L1 and S2 was then infiltrated with Marcaine half percent 1:1 exparel  total of 20 cc used. Incision was then made ellipsing the old incision scar extending from L2-L5 through the skin and subcutaneous layers down to the patient's lumbodorsal fascia and spinous processes. The incision then carried sharply excising the supraspinous ligament and then continuing the lateral aspect of the spinous processes of L1 L2-L3 L4-L5. Cobb elevator used to carefully elevate the paralumbar muscles off of the posterior elements using electrocautery carefully drilled bleeding and perform dissection of the muscle tissues of the preserving the facet at the L1-2 level and L5-S1 Continuing the exposure out laterally to expose the facet joint line laterally at L2-3, L3-4 and L4-5.Patient has significant  scoliosis deformity with prominence of the posterior aspect of the L2 and L3 joints bilaterally and rotation of the spine to the left side so that the spinous processes was larger to than the left side. cerebellar retractor was used for the upper part of the incision and the cerebellar retractor distally. Spinous processes of  L3 and L4, were then resected down to the base the lamina at each segment the lower 50% of the spinous process of L2 was resected and Leksell rongeur used to resect inferior aspect of the lamina on the left side at the  L2 level and partially on the right side at L2. The left medial 40% of the facet of L2-3,  L3-4 and L4-5 were resected in order to decompress the left side of the lumbar thecal sac at L2.3, L:3-4 and L4-5 and decompress the left L3,L4 and L5 neuroforamen. Osteotomes and 63m and 370mkerrisons were used for this portion of the decompression. Similarly the right side decompression was carried out but complete facetectomies were perform on the right at L2-3 , L3-4 and L4-5 to provide for exposure of the right side L2-3 and L3-4 and L4-5 neuroforamen for ease of placement of TLIFs (transforaminal lumbar interbody fusion) at each level inferior portions of the lamina and pars were also resected first beginning with the Leksell rongeur and osteotomes and then resecting using 2 and 3 mm Kerrison. Then performing a resection Of the right L4 lamina and medial L4-5 facet a small dural tear occurred over the right posterior aspect of the L4-5 medial hypertrophic facet,neural elements were intact the dural tear at about 2-3 mm. Cottonoids were placed over the dural tear and continued laminectomy was carried out resecting the central portions of the lamina of  L4 and L5 performing foraminotomies on the right side at the L3, L4 and L5 levels. The inferior articular process L2, L3 and L4 were resected on the right side. The L5 nerve root identified bilaterally and the medial aspect of the L5 pedicle. Superior articular process of L5 was then resected from the right side further decompressing the right L5 nerve and providing for exposure of the area just superior to the L5 pedicle for a placement of cage.A large amount of hypertrophic ligmentum flavum was found impressing on the right lateral recesses at L3-4 and L4-5 and narrowing the respective L3 and L4 neuroforamen. Loupe magnification and headlight were used during this portion procedure.Attention then turned to repair of the dural tear of the right side at L4-5level carefully  neural elements were evaluated and found to be normal within the thecal sac and intact and three 4-0 Neurolon sutures used to repair the dura in simple fashion. Plenty of soft tissue overlying the dura was present which allowed for approximation of the relatively less difficult. Valsalva was performed  To 30 mm Hg and demonstrated no leak from area of the L4-5 dural tear. C-arm fluoroscopy was then brought into the field and using C-arm fluoroscopy then a hole made into the lateral aspect of the pedicle of L2 observed in the pedicle using ball-tipped nerve hook and hockey stick nerve probe initial entry was determined on fluoroscopy to be good position alignment so that a ball handled probe was then used to probe the left L2 pedicle to a depth of nearly 40 mm observed on C-arm fluoroscopy to be beyond the midpoint of the lumbar vertebra and then position alignment within the left L2 pedicle this was then removed and the pedicle channel probed demonstrating patency no sign of rupture the cortex of the pedicle. Tapping with a 4 mm screw then 5.0 mm x 40 mm screw was placed on the left side at the L2 level. C-arm fluoroscopy was then brought into the field and using C-arm fluoroscopy then a hole made into the lateral aspect of the pedicle of L3 observed in the pedicle using ball tipped nerve hook and hockey stick nerve probe initial entry was determined on fluoroscopy to be good position alignment so that a ball handled probe was then used to probe the left L3 pedicle to a depth of nearly 45 mm observed on C-arm fluoroscopy to be beyond the midpoint of the lumbar vertebra and then position alignment within the left L3 pedicle this was then removed and the pedicle channel probed demonstrating patency no sign of rupture the cortex of the pedicle. Tapping with a 5 mm screw then 6.0 mm x 45 mm screw was placed on the left side at the L3 level C-arm fluoroscopy was then brought into the field and using C-arm fluoroscopy  then a hole made into the lateral aspect of the pedicle of L4 observed in the pedicle using ball tipped nerve hook and hockey stick nerve probe initial entry was determined on fluoroscopy to be good position alignment so that a ball handled probe was then used to probe the left L4 pedicle to a depth of nearly 45 mm observed on C-arm fluoroscopy to be beyond the midpoint of the lumbar vertebra and then position alignment within the left L4 pedicle this was then removed and the pedicle channel probed demonstrating patency no sign of rupture the cortex of the pedicle. Tapping with a 5 mm screw then 6.0 mm x 45 mm screw was placed  on the left side at the L4 level. The pedicle channel of L5 on the left probed demonstrating patency no sign of rupture the cortex of the pedicle. Previous screw for fixation of this level was measured as 6.0 mm x 45 mm screw so a 7.61m x 45 mm was placed on the left side at the L5 level. C-arm fluoroscopy was then brought into the field and using C-arm fluoroscopy then the hole into the pedicle of S1 observed with ball-tipped probe the previous S1 pedicle screw removed on this side was 7.0 mm x 446m A 8.31m72m 36m35medicle screw was placed on the left side at the S1 level. Attention then turned to the right side and changing to the sides of the table, the right-sided pedicle screws were passed in a similar fashion. C-arm fluoroscopy was used to localize the hole made in the lateral aspect of the pedicle of L2 on the right localizing the pedicle within the spinal canal with nerve hook and hockey-stick nerve probe carefully passed down the center of the L2 pedicle to a depth of nearly 45 mm. Observed on C-arm fluoroscopy to be in good position alignment channel was probed with a ball-tipped probe ensure patency no sign of cortical disruption. Following tapping with a 5 mm  tap and a 5.0 x 40 mm screw was placed on the right side pedicle at L2. C-arm fluoroscopy was used to localize the hole  made in the lateral aspect of the pedicle of L3 on the right localizing the pedicle within the spinal canal with nerve hook and hockey-stick nerve probe carefully passed down the center of the L3 pedicle to a depth of nearly 45 mm. Observed on C-arm fluoroscopy to be in good position alignment channel was probed with a ball-tipped probe ensure patency no sign of cortical disruption. Following tapping with a 5 mm tap and a 6.0 x 45 mm screw was placed on the right side pedicle at L3. C-arm fluoroscopy was used to localize the hole made in the lateral aspect of the pedicle of L 4 on the right localizing the pedicle within the spinal canal with nerve hook and hockey-stick nerve probe carefully passed down the center of the L 4 pedicle to a depth of nearly 40 mm. Observed on C-arm fluoroscopy to be in good position alignment channel was probed with a ball-tipped probe ensure patency no sign of cortical disruption. Following tapping with a 4 mm and 5 mm taps a 5.0 x 40 mm screw was placed on the right side pedicle at L 4. Attempts were made to place a pedicle on the right side at the L5 level however the pedicle itself appeared to be to angled cranially for application of a screw so that after decompressing the right L4 and L5 nerve root this pedicle was not instrumented.C-arm fluoroscopy was used to localize the hole made in the lateral aspect of the pedicle of S1 on the right localizing the pedicle with a ball tipped probe carefully passed down the center of the S1 pedicle to a depth of nearly 40 mm. Observed on C-arm fluoroscopy to be in good position alignment channel was probed with a ball-tipped probe ensure patency no sign of cortical disruption. Previous right S1 pedicle screw measured 7.31mm 49m31mm 36mhat a 8.0 x 40 mm screw was placed on the right side pedicle at S1. Attention then turned to placement of the transforaminal lumbar interbody fusion cages. Using a Penfield 4 the right lateral aspect  of the thecal  sac at the L4-5 disc space was carefully freed up The thecal sac could then easily be retracted in the posterior lateral aspect of the L4-5 disc was exposed 15 blade scalpel used to incise the posterolateral disc and an osteotome used to resect a small portion of bone off the superior aspect of the posterior superior vertebral body of L5 in order to ease the entry into the L4-5 disc space. A  86m kerrison rongeur was then able to be introduced in the disc space debrided it was quite narrow. 7 mm dilator was used to dialate the L4-5 disc space on the right side attempts were made to dilate further the 8 mm were successful and using small curettes and the disc space was debrided a minimal degenerative disc present in the endplates debrided to bleeding endplate bone. A 8 mm x 285mlordotic Concorde cage was carefully packed with morcellized bone graft and the been harvested from previous laminotomies additional bone graft was then packed into the intervertebral disc space using the 7 mm trial  packed the graft multiple times. With this then a 8 mm x 2769mordotic cage was able to be introduced into the disc space on the right side using the correct degree convergence and then impacted then subset beneath the outer aspect of the disc space by about 3 or 4 mm. Bleeding controlled using bipolar electrocautery thrombin soaked gel cottonoids. Then turned to the right L3-4 level similarly the exposure the posterior lateral aspect this was carried out using a Penfield 4 bipolar electrocautery to control small bleeders present. Derricho retractor used to retract the thecal sac and L4 nerve root a 15 blade scalpel was used to incise posterior lateral aspect of the disc the disc space at this level showed a rather severe narrowing posteriorly was more open anteriorly so that an osteotome again was used to resect a small portion the posterior superior lip of the vertebral body at L4  in order to gain ease of access into the L3-4  disc space. The space was debrided of degenerative disc material using pituitary along root the entire disc space was then debrided of degenerative disc material using pituitary rongeurs curettage down to bleeding bone endplates. Residual disc was resected using pituitary. This space was then carefully assess using spacers  a 9.0mm15m27mm41me provided the best fit the lordotic cage was chosen the intervertebral disc space was then packed with autogenous local bone graft that been harvested from the central laminectomy and this was pack multiple times and impacted with 7 mm trial cage apparatus. This provided excellent bone graft within the intervertebral disc space at L3-4 so that the permanent 9.0 mm cage by 27 mm was then packed with local bone graft placed into the intervertebral disc space and impacted into place in the correct degree of convergence. Bleeding controlled using bipolar electrocautery. The cage was subset beneath the posterior aspect of the L3-4 disc space by about 3-4 mm attempting to open the disc space on the right side than as with the L4-5 level. Bleeding hemostasis then attention was turned to the right side at the L2-3 level the right side portion of the disc was excised on the right side then carefully retracting the thecal sac and the appropriate nerveL3 root  pituitary rongeurs were used to debride this cavity L2-3 level a lamina spreader was necessary in order to open the disc space wide enough to accept instruments to debride the intervertebral disc  space. At the L2-3 level the disc was quite tight a 7 mm spreader was able to be introduced and then an 32m spreader. 8 mm x 221mcage was chosen parallel type. A vertebral disc space was debrided of degenerative disc material and endplate and a curet and pituitary rongeurs. This was completely local bone graft was packed into the disc space multiple time using the smaller 7 mm trial to impact the graft. Finally the 8 mm x 27 mm parallel  Concorde cage was packed with local bone graft and this was then impacted into the disc space on the right side at L2-3 to correct the patient's scoliosis concavity. The cage was placed on the right side but also directed anteriorly in order to correct kyphosis noted to be present. Cage was subset beneath the posterior aspect of the disc space 2 or 3 mm. Observed on C-arm fluoroscopy to be in good position alignment. The cages at L2-3 L3-4 and L4-5 were placed anteriorly as best as possible the correct patient's kyphosis that was present. With this then the transforaminal lumbar interbody fusion portion of the case was completed bleeders were controlled using bipolar electrocautery thrombin-soaked Gelfoam were appropriate. 5 screws on the left and 4 screws on the right and then each carefully aligned and tightened or loose and a slight amount to allow for placement of rods. Template was made along the right side first quarter inch titanium rod was then carefully contoured used using the template and divided appropriately. This was then placed into the pedicle screws on the right extending from L2-S1 each of the capsule and carefully placed loosely tightened. Attention turned to the left side were similarly and then screws were carefully adjusted to allow for a better pattern screws to allow for placement of fixation rod template for the rod was then taken and a quarter inch titanium rod was then carefully contoured. This was able to be inserted into the right pedicle screw fasteners and then using a persuader to place caps onto the right L4 fasteners the upper 2 pedicle screw caps appeared to fit quite well. The right S1 rod fastener cap was then tightened 85 pounds then on the right side disc space at L4-5 and L3-4 and L2-3 screw fasteners compression was obtained on the right side between L4 and S1, L3 andL4, and L2 and L3 by compressing between the fasteners right hand side and tightening the screw caps 85  pounds. Similarly this was done on the left side at L5-S1, L4-5, L3-4 and L2-3 screws using slightl compression and tightened 85 pounds. Iirrigation was carried out with copious amounts of saline solution this was done throughout the case. Cell Saver was used during the case. A single cross-link for the Depuy expedium system was placed at the L3-4 level measured with the measuring tool and the appropriate A6 cross-link was then carefully contoured and applied to the rods and tightened again to 80 foot-pounds bilaterally using the appropriate torque screwdriver. Center screw on the cross-link was then carefully tightened 80 foot-pounds irrigation was carried out observation areas of dural tear demonstrated no leakage present. DuraSeal was then applied to each of the small dural leak areas providing for watertight seal. Permanent C-arm images were obtained in AP and lateral plane. Remaining local bone graft was then applied along the left lateral posterior lateral region extending from L2 through L5 transverse processes to the left L5-S1 postolateral fusion mass. Excess Gelfoam was then removed the lumbodorsal musculature carefully exam debrided of  any devitalized tissue following removal of Vicryl retractors were the bleeders were controlled using electrocautery and the area dorsal lumbar muscle were then approximated in the midline with interrupted #1 Vicryl sutures loose the dorsal fascia was reattached to the spinous process of L1 to superiorly and S1 inferiorly this was done with #1 Vicryl sutures. Subcutaneous layers then approximated using interrupted 0 Vicryl sutures and 2-0 Vicryl sutures. Skin was closed with a running subcutaneous stitch of 4-0 Vicryl Dermabond was applied then MedPlex bandage. All instrument and sponge counts were correct. The patient was then returned to a supine position on her bed reactivated extubated and returned to the recovery room in satisfactory condition.   Benjiman Core, PA-C  perform the duties of assistant surgeon during this case. He was present from the beginning of the case to the end of the case assisting in transfer the patient from his stretcher to the OR table and back to the stretcher at the end of the case. Assisted in careful retraction and suction of the laminectomy site delicate neural structures operating under the operating room microscope. He performed closure of the incision from the fascia to the skin applying the dressing.           Basil Dess  03/25/2021, Basil Dess

## 2021-03-25 NOTE — Transfer of Care (Signed)
Immediate Anesthesia Transfer of Care Note  Patient: Stacy [name]  Procedure(s) Performed: LEFT L2-3, L3-4, L4-5 TRANSFORAMINAL LUMBAR INTERBODY FUSIONS WITH RODS, SCREWS AND CAGES, LOCAL BONE GRAFT, ALLOGRAFT BONE GRAFT, VIVIGEN (Back)  Patient Location: PACU  Anesthesia Type:General  Level of Consciousness: drowsy  Airway & Oxygen Therapy: Patient Spontanous Breathing and Patient connected to face mask oxygen  Post-op Assessment: Report given to RN and Post -op Vital signs reviewed and stable  Post vital signs: Reviewed and stable  Last Vitals:  Vitals Value Taken Time  BP 152/93 03/25/21 1406  Temp    Pulse 89 03/25/21 1407  Resp 16 03/25/21 1408  SpO2 100 % 03/25/21 1407  Vitals shown include unvalidated device data.  Last Pain:  Vitals:   03/25/21 0620  TempSrc:   PainSc: 7       Patients Stated Pain Goal: 0 (03/25/21 0160)  Complications: No notable events documented.

## 2021-03-25 NOTE — Anesthesia Procedure Notes (Signed)
Procedure Name: Intubation Date/Time: 03/25/2021 8:04 AM Performed by: Erick Colace, CRNA Pre-anesthesia Checklist: Patient identified, Emergency Drugs available, Suction available and Patient being monitored Patient Re-evaluated:Patient Re-evaluated prior to induction Oxygen Delivery Method: Circle system utilized Preoxygenation: Pre-oxygenation with 100% oxygen Induction Type: IV induction Ventilation: Mask ventilation without difficulty Laryngoscope Size: Mac and 4 Grade View: Grade I Tube type: Oral Tube size: 7.0 mm Number of attempts: 1 Airway Equipment and Method: Stylet and Oral airway Placement Confirmation: ETT inserted through vocal cords under direct vision, positive ETCO2 and breath sounds checked- equal and bilateral Secured at: 21 cm Tube secured with: Tape Dental Injury: Teeth and Oropharynx as per pre-operative assessment

## 2021-03-25 NOTE — Anesthesia Postprocedure Evaluation (Signed)
Anesthesia Post Note  Patient: Stacy [name] Level  Procedure(s) Performed: LEFT L2-3, L3-4, L4-5 TRANSFORAMINAL LUMBAR INTERBODY FUSIONS WITH RODS, SCREWS AND CAGES, LOCAL BONE GRAFT, ALLOGRAFT BONE GRAFT, VIVIGEN (Back)     Patient location during evaluation: PACU Anesthesia Type: General Level of consciousness: awake and alert Pain management: pain level controlled Vital Signs Assessment: post-procedure vital signs reviewed and stable Respiratory status: spontaneous breathing, nonlabored ventilation and respiratory function stable Cardiovascular status: blood pressure returned to baseline and stable Postop Assessment: no apparent nausea or vomiting Anesthetic complications: no   No notable events documented.  Last Vitals:  Vitals:   03/25/21 1435 03/25/21 1450  BP: 131/81 126/80  Pulse: 88 88  Resp: 11 12  Temp:    SpO2: 100% 100%    Last Pain:  Vitals:   03/25/21 1435  TempSrc:   PainSc: Asleep                 Lucretia Kern

## 2021-03-25 NOTE — Discharge Instructions (Signed)

## 2021-03-25 NOTE — Interval H&P Note (Signed)
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  The various methods of treatment have been discussed with the patient and family. After consideration of risks, benefits and other options for treatment, the patient has consented to  Procedure(s): LEFT L2-3, L3-4, L4-5 TRANSFORAMINAL LUMBAR INTERBODY FUSIONS WITH RODS, SCREWS AND CAGES, LOCAL BONE GRAFT, ALLOGRAFT BONE GRAFT, VIVIGEN (N/A) as a surgical intervention.  The patient's history has been reviewed, patient examined, no change in status, stable for surgery.  I have reviewed the patient's chart and labs.  Questions were answered to the patient's satisfaction.     [name]

## 2021-03-26 ENCOUNTER — Encounter (HOSPITAL_COMMUNITY): Payer: Self-pay | Admitting: Specialist

## 2021-03-26 LAB — GLUCOSE, CAPILLARY
Glucose-Capillary: 139 mg/dL — ABNORMAL HIGH (ref 70–99)
Glucose-Capillary: 205 mg/dL — ABNORMAL HIGH (ref 70–99)
Glucose-Capillary: 137 mg/dL — ABNORMAL HIGH (ref 70–99)
Glucose-Capillary: 160 mg/dL — ABNORMAL HIGH (ref 70–99)

## 2021-03-26 LAB — CBC WITH DIFFERENTIAL/PLATELET
Abs Immature Granulocytes: 0.03 10*3/uL (ref 0.00–0.07)
Basophils Absolute: 0 10*3/uL (ref 0.0–0.1)
Basophils Relative: 0 %
Eosinophils Absolute: 0 10*3/uL (ref 0.0–0.5)
Eosinophils Relative: 0 %
HCT: 26.5 % — ABNORMAL LOW (ref 36.0–46.0)
Hemoglobin: 9 g/dL — ABNORMAL LOW (ref 12.0–15.0)
Immature Granulocytes: 0 %
Lymphocytes Relative: 26 %
Lymphs Abs: 3 10*3/uL (ref 0.7–4.0)
MCH: 26.9 pg (ref 26.0–34.0)
MCHC: 34 g/dL (ref 30.0–36.0)
MCV: 79.1 fL — ABNORMAL LOW (ref 80.0–100.0)
Monocytes Absolute: 0.8 10*3/uL (ref 0.1–1.0)
Monocytes Relative: 7 %
Neutro Abs: 7.5 10*3/uL (ref 1.7–7.7)
Neutrophils Relative %: 67 %
Platelets: 227 10*3/uL (ref 150–400)
RBC: 3.35 MIL/uL — ABNORMAL LOW (ref 3.87–5.11)
RDW: 17.5 % — ABNORMAL HIGH (ref 11.5–15.5)
WBC: 11.3 10*3/uL — ABNORMAL HIGH (ref 4.0–10.5)
nRBC: 0 % (ref 0.0–0.2)

## 2021-03-26 LAB — BASIC METABOLIC PANEL
Anion gap: 9 (ref 5–15)
BUN: 11 mg/dL (ref 8–23)
CO2: 28 mmol/L (ref 22–32)
Calcium: 8.7 mg/dL — ABNORMAL LOW (ref 8.9–10.3)
Chloride: 95 mmol/L — ABNORMAL LOW (ref 98–111)
Creatinine, Ser: 1.06 mg/dL — ABNORMAL HIGH (ref 0.44–1.00)
GFR, Estimated: 60 mL/min — ABNORMAL LOW (ref >60)
Glucose, Bld: 162 mg/dL — ABNORMAL HIGH (ref 70–99)
Potassium: 3.8 mmol/L (ref 3.5–5.1)
Sodium: 132 mmol/L — ABNORMAL LOW (ref 135–145)

## 2021-03-26 MED ORDER — PANTOPRAZOLE SODIUM 40 MG PO TBEC
40.0000 mg | DELAYED_RELEASE_TABLET | Freq: Every day | ORAL | Status: DC
  Administered 2021-03-27 – 2021-03-28 (×2): 40 mg via ORAL
  Filled 2021-03-26 (×2): qty 1

## 2021-03-26 NOTE — Evaluation (Signed)
Physical Therapy Evaluation Patient Details Name: Stacy [name] [facility] MRN: [id number] DOB: March 08, 1960 Today's Date: 03/26/2021  History of Present Illness  61 yo F s/p L2-3, L3-4, L4-5 PLIF.  PMH includes: COPD, Gout, R TKA, L THA, GERD, prior back surgeries, carpal tunnel syndrome.  Clinical Impression  Pt needs min assist transferring to standing and then back to sitting and was distracted by pain with transfers. Bed mobility was not assessed this session with PT, plan to review bed mobility in the next session. Pt was able to recall precautions with mild cuing to remember "no twisting". Pt required min guard assist to ambulate with walker and requested to walk more. During physical exam patient showed deficits in strength, endurance, activity tolerance. Not currently recommending physical therapy post acute, will update recommendation if mobility changes. Will continue to follow acutely to maximize functional mobility, independence and safety.      Recommendations for follow up therapy are one component of a multi-disciplinary discharge planning process, led by the attending physician.  Recommendations may be updated based on patient status, additional functional criteria and insurance authorization.  Follow Up Recommendations No PT follow up    Assistance Recommended at Discharge PRN  Patient can return home with the following  A little help with walking and/or transfers;A little help with bathing/dressing/bathroom;Assistance with cooking/housework;Assist for transportation;Help with stairs or ramp for entrance    Equipment Recommendations Rolling walker (2 wheels)  Recommendations for Other Services       Functional Status Assessment Patient has had a recent decline in their functional status and demonstrates the ability to make significant improvements in function in a reasonable and predictable amount of time.     Precautions / Restrictions Precautions Precautions:  Back Required Braces or Orthoses: Spinal Brace Spinal Brace: Thoracolumbosacral orthotic Restrictions Weight Bearing Restrictions: No      Mobility  Bed Mobility               General bed mobility comments: Pt was in the chair at the start of the session and was returned to the chair.    Transfers Overall transfer level: Needs assistance Equipment used: Rolling walker (2 wheels) Transfers: Sit to/from Stand Sit to Stand: Min assist           General transfer comment: Pt required min assist with sit to stand with cuing for hand placement.    Ambulation/Gait Ambulation/Gait assistance: Min guard Gait Distance (Feet): 200 Feet Assistive device: Rolling walker (2 wheels) Gait Pattern/deviations: Step-through pattern, Decreased stride length       General Gait Details: Pt was min guard for ambulation. She reported feeling better with walking. She had no loss of balance.  Stairs            Wheelchair Mobility    Modified Rankin (Stroke Patients Only)       Balance Overall balance assessment: Needs assistance Sitting-balance support: Feet supported, Bilateral upper extremity supported Sitting balance-Leahy Scale: Fair Sitting balance - Comments: Pt had increase pain with sitting balance.   Standing balance support: Reliant on assistive device for balance Standing balance-Leahy Scale: Fair Standing balance comment: Pt was able to wash her hands at the sink with minguard.                             Pertinent Vitals/Pain Pain Assessment Pain Assessment: Faces Faces Pain Scale: Hurts little more Pain Location: Pt denies pain at the start of the session but  has some pain with sit to stand transfer. Pt recieve pain medication before within and hour of the session Pain Descriptors / Indicators: Tender, Aching, Operative site guarding, Grimacing, Guarding Pain Intervention(s): Monitored during session    Home Living Family/patient expects to  be discharged to:: Private residence Living Arrangements: Spouse/significant other Available Help at Discharge: Family;Available 24 hours/day Type of Home: House Home Access: Stairs to enter   [employer] of Steps: 2 to 3 from the parking lot.  Sounds like a walk in between stairs.  2 story condo. Alternate Level Stairs-Number of Steps: Stays on the main level with hospital bed and bathroom. Home Layout: Two level Home Equipment: None Additional Comments: Pt's husband is available and able to help 24/7    Prior Function Prior Level of Function : Needs assist             Mobility Comments: Uses her mother's RW ADLs Comments: Occasional assist with lower body dressing and community mobility.     Hand Dominance   Dominant Hand: Left    Extremity/Trunk Assessment   Upper Extremity Assessment Upper Extremity Assessment: Defer to OT evaluation    Lower Extremity Assessment Lower Extremity Assessment: Overall WFL for tasks assessed    Cervical / Trunk Assessment Cervical / Trunk Assessment: Back Surgery  Communication   Communication: No difficulties  Cognition Arousal/Alertness: Awake/alert Behavior During Therapy: WFL for tasks assessed/performed Overall Cognitive Status: Within Functional Limits for tasks assessed                                 General Comments: Pt was occasionally distracted by her pain during activities that brought it on such as sit to stand/stand to sit transfer.        General Comments General comments (skin integrity, edema, etc.): Pt required assistance managing brief and gown when getting onto the [facility] over the toilet. Pt requested to walk more and was educated on building activity tolerance gradually and not over doing it.    Exercises     Assessment/Plan    PT Assessment Patient needs continued PT services  PT Problem List Decreased mobility;Decreased range of motion;Decreased activity tolerance       PT  Treatment Interventions DME instruction;Gait training;Stair training;Functional mobility training;Therapeutic activities;Patient/family education;Cognitive remediation;Neuromuscular re-education;Balance training;Therapeutic exercise    PT Goals (Current goals can be found in the Care Plan section)  Acute Rehab PT Goals Patient Stated Goal: Manage pain and maintain independance. PT Goal Formulation: With patient Time For Goal Achievement: 04/09/21 Potential to Achieve Goals: Fair    Frequency Min 5X/week     Co-evaluation               AM-PAC PT "6 Clicks" Mobility  Outcome Measure Help needed turning from your back to your side while in a flat bed without using bedrails?: A Little Help needed moving from lying on your back to sitting on the side of a flat bed without using bedrails?: A Little Help needed moving to and from a bed to a chair (including a wheelchair)?: A Little Help needed standing up from a chair using your arms (e.g., wheelchair or bedside chair)?: A Little Help needed to walk in hospital room?: A Little Help needed climbing 3-5 steps with a railing? : Total 6 Click Score: 16    End of Session Equipment Utilized During Treatment: Gait belt Activity Tolerance: Patient tolerated treatment well Patient left: in chair;with  call bell/phone within reach   PT Visit Diagnosis: Pain;Other abnormalities of gait and mobility (R26.89)    Time: 7026-3785 PT Time Calculation (min) (ACUTE ONLY): 32 min   Charges:   PT Evaluation $PT Eval Low Complexity: 1 Low PT Treatments $Gait Training: 8-22 mins        Armanda Heritage, SPT   Armanda Heritage 03/26/2021, 3:03 PM

## 2021-03-26 NOTE — Evaluation (Signed)
Occupational Therapy Evaluation Patient Details Name: Stacy [name] [facility] MRN: [id number] DOB: 07-25-1960 Today's Date: 03/26/2021   History of Present Illness 61 yo F s/p L2-3, L3-4, L4-5 PLIF.  PMH includes: COPD, Gout, R TKA, L THA, GERD, prior back surgeries.   Clinical Impression   Patient admitted for the procedure above.  PTA she lives at home with her spouse.  She has a friend that lives nearby that will be assisting her as needed.  Primary deficit is post op pain.  Currently she is needing Min A for bed mobility, reinforcement of back precautions, and up to Mod A for lower body ADL.  OT will continue efforts in the acute setting, and no post acute OT is anticipated.        Recommendations for follow up therapy are one component of a multi-disciplinary discharge planning process, led by the attending physician.  Recommendations may be updated based on patient status, additional functional criteria and insurance authorization.   Follow Up Recommendations  No OT follow up    Assistance Recommended at Discharge Intermittent Supervision/Assistance  Patient can return home with the following      Functional Status Assessment  Patient has had a recent decline in their functional status and demonstrates the ability to make significant improvements in function in a reasonable and predictable amount of time.  Equipment Recommendations  BSC/3in1;Tub/shower seat;Other (comment) (hip kit)    Recommendations for Other Services       Precautions / Restrictions Precautions Precautions: Back Precaution Booklet Issued: Yes (comment) Required Braces or Orthoses: Spinal Brace Spinal Brace: Thoracolumbosacral orthotic Restrictions Weight Bearing Restrictions: No      Mobility Bed Mobility Overal bed mobility: Needs Assistance Bed Mobility: Sidelying to Sit   Sidelying to sit: Min assist            Transfers Overall transfer level: Needs assistance Equipment used: Rolling  walker (2 wheels) Transfers: Sit to/from Stand, Bed to chair/wheelchair/BSC Sit to Stand: Min assist     Step pivot transfers: Min guard            Balance Overall balance assessment: Needs assistance Sitting-balance support: Feet supported, Bilateral upper extremity supported Sitting balance-Leahy Scale: Fair   Postural control: Posterior lean Standing balance support: Reliant on assistive device for balance Standing balance-Leahy Scale: Poor                             ADL either performed or assessed with clinical judgement   ADL       Grooming: Wash/dry hands;Supervision/safety;Standing       Lower Body Bathing: Moderate assistance;Sitting/lateral leans       Lower Body Dressing: Moderate assistance;Sit to/from stand   Toilet Transfer: Min guard;Rolling walker (2 wheels);Ambulation;BSC/3in1                   Vision Patient Visual Report: No change from baseline       Perception Perception Perception: Not tested   Praxis Praxis Praxis: Not tested    Pertinent Vitals/Pain Pain Assessment Pain Assessment: Faces Faces Pain Scale: Hurts even more Pain Location: incisional/back Pain Descriptors / Indicators: Tender, Aching, Operative site guarding, Grimacing, Guarding Pain Intervention(s): Monitored during session     Hand Dominance Left   Extremity/Trunk Assessment Upper Extremity Assessment Upper Extremity Assessment: Overall WFL for tasks assessed   Lower Extremity Assessment Lower Extremity Assessment: Defer to PT evaluation   Cervical / Trunk Assessment Cervical / Trunk  Assessment: Back Surgery   Communication Communication Communication: No difficulties   Cognition Arousal/Alertness: Awake/alert Behavior During Therapy: WFL for tasks assessed/performed Overall Cognitive Status: Within Functional Limits for tasks assessed                                       General Comments       Exercises      Shoulder Instructions      Home Living Family/patient expects to be discharged to:: Private residence Living Arrangements: Spouse/significant other Available Help at Discharge: Family;Available 24 hours/day Type of Home: House Home Access: Stairs to enter [employer] of Steps: 2 to 3 from the parking lot.  Sounds like a walk in between stairs.  2 story condo.   Home Layout: Two level Alternate Level Stairs-Number of Steps: Stays on the main level with hospital bed and bathroom.   Bathroom Shower/Tub: [employer]vertising copywriter: Yes How Accessible: Accessible via walker Home Equipment: None          Prior Functioning/Environment Prior Level of Function : Needs assist             Mobility Comments: Uses her mother's RW ADLs Comments: Occasional assist with lower body dressing and community mobility.        OT Problem List: Decreased range of motion;Impaired balance (sitting and/or standing);Pain;Decreased knowledge of use of DME or AE      OT Treatment/Interventions: Self-care/ADL training;Balance training;Therapeutic activities;DME and/or AE instruction    OT Goals(Current goals can be found in the care plan section) Acute Rehab OT Goals Patient Stated Goal: Maybe home tomorrow OT Goal Formulation: With patient Time For Goal Achievement: 04/09/21 Potential to Achieve Goals: Good ADL Goals Pt Will Perform Grooming: with modified independence;standing Pt Will Perform Lower Body Bathing: with supervision;sit to/from stand Pt Will Perform Lower Body Dressing: with supervision;sit to/from stand Pt Will Transfer to Toilet: with modified independence;ambulating;regular height toilet  OT Frequency: Min 2X/week    Co-evaluation              AM-PAC OT "6 Clicks" Daily Activity     Outcome Measure Help from another person eating meals?: None Help from another person taking care of personal grooming?: A  Little Help from another person toileting, which includes using toliet, bedpan, or urinal?: A Little Help from another person bathing (including washing, rinsing, drying)?: A Lot Help from another person to put on and taking off regular upper body clothing?: A Little Help from another person to put on and taking off regular lower body clothing?: A Lot 6 Click Score: 17   End of Session Equipment Utilized During Treatment: Rolling walker (2 wheels) Nurse Communication: Mobility status  Activity Tolerance: Patient tolerated treatment well Patient left: in chair;with call bell/phone within reach;with chair alarm set  OT Visit Diagnosis: Unsteadiness on feet (R26.81);Pain                Time: 1000-1018 OT Time Calculation (min): 18 min Charges:  OT General Charges $OT Visit: 1 Visit OT Evaluation $OT Eval Moderate Complexity: 1 Mod  03/26/2021  RP, OTR/L  Acute Rehabilitation Services  Office:  [phone]   Metta Clines 03/26/2021, 10:28 AM

## 2021-03-26 NOTE — Progress Notes (Signed)
° ° ° °  Subjective: 1 Day Post-Op Procedure(s) (LRB): LEFT L2-3, L3-4, L4-5 TRANSFORAMINAL LUMBAR INTERBODY FUSIONS WITH RODS, SCREWS AND CAGES, LOCAL BONE GRAFT, ALLOGRAFT BONE GRAFT, VIVIGEN (N/A) Awake, alert and oriented x 4. Difficulty voiding post foley discontinued but she is up in bedside recliner eating lunch. Reports she feels better standing and walking, leg pain is gone and the left leg feels strong.   Patient reports pain as moderate.    Objective:   VITALS:  Temp:  [97.3 F (36.3 C)-99.7 F (37.6 C)] 99 F (37.2 C) (02/22 1153) Pulse Rate:  [81-99] 82 (02/22 1153) Resp:  [11-18] 17 (02/22 1153) BP: (106-152)/(69-99) 123/83 (02/22 1153) SpO2:  [84 %-100 %] 91 % (02/22 1153)  Neurologically intact ABD soft Neurovascular intact Sensation intact distally Intact pulses distally Dorsiflexion/Plantar flexion intact Incision: dressing C/D/I and no drainage   LABS No results for input(s): HGB, WBC, PLT in the last 72 hours. No results for input(s): NA, K, CL, CO2, BUN, CREATININE, GLUCOSE in the last 72 hours. No results for input(s): LABPT, INR in the last 72 hours.   Assessment/Plan: 1 Day Post-Op Procedure(s) (LRB): LEFT L2-3, L3-4, L4-5 TRANSFORAMINAL LUMBAR INTERBODY FUSIONS WITH RODS, SCREWS AND CAGES, LOCAL BONE GRAFT, ALLOGRAFT BONE GRAFT, VIVIGEN (N/A)  Advance diet Up with therapy D/C IV fluids Check lab, apparently not ordered Diet was written on post op orders but not on the diabetes order set so was held, ?why need to order twice.  Stacy [name] 03/26/2021, 1:17 PM Patient ID: Stacy [name], female   DOB: 1960-07-14, 61 y.o.   MRN: [id number]

## 2021-03-27 LAB — GLUCOSE, CAPILLARY
Glucose-Capillary: 148 mg/dL — ABNORMAL HIGH (ref 70–99)
Glucose-Capillary: 138 mg/dL — ABNORMAL HIGH (ref 70–99)
Glucose-Capillary: 158 mg/dL — ABNORMAL HIGH (ref 70–99)
Glucose-Capillary: 111 mg/dL — ABNORMAL HIGH (ref 70–99)

## 2021-03-27 MED ORDER — FERROUS GLUCONATE 324 (38 FE) MG PO TABS
324.0000 mg | ORAL_TABLET | Freq: Two times a day (BID) | ORAL | Status: DC
  Administered 2021-03-27 – 2021-03-28 (×2): 324 mg via ORAL
  Filled 2021-03-27 (×3): qty 1

## 2021-03-27 MED FILL — Sodium Chloride Irrigation Soln 0.9%: Qty: 3000 | Status: AC

## 2021-03-27 MED FILL — Heparin Sodium (Porcine) Inj 1000 Unit/ML: INTRAMUSCULAR | Qty: 30 | Status: AC

## 2021-03-27 NOTE — Progress Notes (Signed)
° ° ° °  Subjective: 2 Days Post-Op Procedure(s) (LRB): LEFT L2-3, L3-4, L4-5 TRANSFORAMINAL LUMBAR INTERBODY FUSIONS WITH RODS, SCREWS AND CAGES, LOCAL BONE GRAFT, ALLOGRAFT BONE GRAFT, VIVIGEN (N/A) Awake, alert and oriented x 4, walking with PT needs work on some steps today.  Patient reports pain as moderate.    Objective:   VITALS:  Temp:  [98 F (36.7 C)-99.6 F (37.6 C)] 99.6 F (37.6 C) (02/23 1136) Pulse Rate:  [90-104] 98 (02/23 1136) Resp:  [18-19] 18 (02/23 1136) BP: (98-126)/(54-70) 98/54 (02/23 1136) SpO2:  [94 %-97 %] 94 % (02/23 1136)  Neurologically intact ABD soft Neurovascular intact Sensation intact distally Intact pulses distally Dorsiflexion/Plantar flexion intact Incision: dressing C/D/I and no drainage   LABS Recent Labs    03/26/21 1511  HGB 9.0*  WBC 11.3*  PLT 227   Recent Labs    03/26/21 1511  NA 132*  K 3.8  CL 95*  CO2 28  BUN 11  CREATININE 1.06*  GLUCOSE 162*   No results for input(s): LABPT, INR in the last 72 hours.   Assessment/Plan: 2 Days Post-Op Procedure(s) (LRB): LEFT L2-3, L3-4, L4-5 TRANSFORAMINAL LUMBAR INTERBODY FUSIONS WITH RODS, SCREWS AND CAGES, LOCAL BONE GRAFT, ALLOGRAFT BONE GRAFT, VIVIGEN (N/A) Anemia due to blood loss Hgb 9.0 start Fe and recheck in AM.   Advance diet Up with therapy D/C IV fluids Plan for discharge tomorrow  [name] 03/27/2021, 1:52 PM Patient ID: Stacy [name], female   DOB: 1960-03-09, 61 y.o.   MRN: [id number]

## 2021-03-27 NOTE — Progress Notes (Signed)
Physical Therapy Treatment Patient Details Name: Stacy [name] [facility] MRN: [id number] DOB: July 11, 1960 Today's Date: 03/27/2021   History of Present Illness 61 yo F s/p L2-3, L3-4, L4-5 PLIF.  PMH includes: COPD, Gout, R TKA, L THA, GERD, prior back surgeries, carpal tunnel syndrome.    PT Comments    Pt had decreased pain with transfers and required less assistance. She needed min guard for ambulation due to occasional increased sway. She was able to ascend and descend a full flight of stairs with min assist for balance and extra cuing. During treatment session patient showed deficits in balance during functional activity. Not currently recommending therapy services post acute. Will continue to follow acutely to maximize functional mobility, independence. and safety.   Recommendations for follow up therapy are one component of a multi-disciplinary discharge planning process, led by the attending physician.  Recommendations may be updated based on patient status, additional functional criteria and insurance authorization.  Follow Up Recommendations  No PT follow up     Assistance Recommended at Discharge PRN  Patient can return home with the following A little help with walking and/or transfers;A little help with bathing/dressing/bathroom;Assistance with cooking/housework;Assist for transportation;Help with stairs or ramp for entrance   Equipment Recommendations  Rolling walker (2 wheels)    Recommendations for Other Services       Precautions / Restrictions Precautions Precautions: Back Precaution Booklet Issued: Yes (comment) Precaution Comments: Precautions were reviewed today. Required Braces or Orthoses: Spinal Brace Spinal Brace: Thoracolumbosacral orthotic Restrictions Weight Bearing Restrictions: No     Mobility  Bed Mobility Overal bed mobility: Needs Assistance Bed Mobility: Rolling, Sidelying to Sit Rolling: Independent Sidelying to sit: Supervision        General bed mobility comments: Pt was supervision for sit to sidelying to ensure adherance to precautions/    Transfers Overall transfer level: Needs assistance Equipment used: Rolling walker (2 wheels) Transfers: Sit to/from Stand Sit to Stand: Min guard           General transfer comment: Pt was min gaurd for sit to stand from EOB for safety but then later was independent transfering sit to stand off the [facility].    Ambulation/Gait Ambulation/Gait assistance: Min guard Gait Distance (Feet): 200 Feet Assistive device: Rolling walker (2 wheels) Gait Pattern/deviations: Step-through pattern, Decreased stride length       General Gait Details: Pt was min guard for ambulation, she was dependent on her walker and had increased sway at times.   Stairs Stairs: Yes Stairs assistance: Min assist Stair Management: One rail Left, Forwards Number of Stairs: 10 General stair comments: Pt was educated about moving one step at a time and stepping down with the weaker leg when descending the stairs. Required multiple cues when descending stairs and required min assist for stability. Plan to review stairs in the next session.   Wheelchair Mobility    Modified Rankin (Stroke Patients Only)       Balance Overall balance assessment: Needs assistance Sitting-balance support: Feet supported Sitting balance-Leahy Scale: Good     Standing balance support: Reliant on assistive device for balance, During functional activity Standing balance-Leahy Scale: Fair Standing balance comment: Pt was able to wash her hands at the sink with independently and required min guard for stair training.               High Level Balance Comments: Pt was able to slowly march in place. 6x bil  Cognition Arousal/Alertness: Awake/alert Behavior During Therapy: WFL for tasks assessed/performed Overall Cognitive Status: Within Functional Limits for tasks assessed                                           Exercises      General Comments        Pertinent Vitals/Pain Pain Assessment Pain Assessment: No/denies pain    Home Living                          Prior Function            PT Goals (current goals can now be found in the care plan section)      Frequency    Min 5X/week      PT Plan Current plan remains appropriate    Co-evaluation              AM-PAC PT "6 Clicks" Mobility   Outcome Measure  Help needed turning from your back to your side while in a flat bed without using bedrails?: None Help needed moving from lying on your back to sitting on the side of a flat bed without using bedrails?: None Help needed moving to and from a bed to a chair (including a wheelchair)?: None Help needed standing up from a chair using your arms (e.g., wheelchair or bedside chair)?: None Help needed to walk in hospital room?: A Little Help needed climbing 3-5 steps with a railing? : A Little 6 Click Score: 22    End of Session Equipment Utilized During Treatment: Gait belt Activity Tolerance: Patient tolerated treatment well Patient left: in chair;with call bell/phone within reach   PT Visit Diagnosis: Pain;Other abnormalities of gait and mobility (R26.89)     Time: [id number] PT Time Calculation (min) (ACUTE ONLY): 23 min  Charges:  $Gait Training: 8-22 mins $Therapeutic Activity: 8-22 mins                     Quenton Fetter, SPT    Quenton Fetter 03/27/2021, 4:19 PM

## 2021-03-27 NOTE — Progress Notes (Signed)
Occupational Therapy Treatment Patient Details Name: Stacy [name] [facility] MRN: [id number] DOB: 06-26-1960 Today's Date: 03/27/2021   History of present illness 61 yo F s/p L2-3, L3-4, L4-5 PLIF.  PMH includes: COPD, Gout, R TKA, L THA, GERD, prior back surgeries, carpal tunnel syndrome.   OT comments  Patient with good progress toward goals.  Patient expresses less discomfort this session, and is expecting to discharge home tomorrow.  OT will try and plan an ADL session for tomorrow and assess her hip kit needs.  Continue efforts in the acute setting.     Recommendations for follow up therapy are one component of a multi-disciplinary discharge planning process, led by the attending physician.  Recommendations may be updated based on patient status, additional functional criteria and insurance authorization.    Follow Up Recommendations  No OT follow up    Assistance Recommended at Discharge Intermittent Supervision/Assistance  Patient can return home with the following      Equipment Recommendations  BSC/3in1;Tub/shower seat;Other (comment)    Recommendations for Other Services      Precautions / Restrictions Precautions Precautions: Back Required Braces or Orthoses: Spinal Brace Spinal Brace: Thoracolumbosacral orthotic Restrictions Weight Bearing Restrictions: No       Mobility Bed Mobility Overal bed mobility: Needs Assistance Bed Mobility: Sit to Sidelying         Sit to sidelying: Min assist      Transfers Overall transfer level: Needs assistance Equipment used: Rolling walker (2 wheels) Transfers: Sit to/from Stand Sit to Stand: Supervision                 Balance Overall balance assessment: Needs assistance Sitting-balance support: Feet supported Sitting balance-Leahy Scale: Good     Standing balance support: Reliant on assistive device for balance Standing balance-Leahy Scale: Fair                             ADL either  performed or assessed with clinical judgement   ADL       Grooming: Wash/dry hands;Supervision/safety;Standing               Lower Body Dressing: Moderate assistance;Sit to/from stand   Toilet Transfer: Supervision/safety;Rolling walker (2 wheels)                                                                                                                     Pertinent Vitals/ Pain       Pain Assessment Faces Pain Scale: Hurts a little bit Pain Descriptors / Indicators: Sore Pain Intervention(s): Monitored during session                                                          Frequency  Min 2X/week        Progress  Toward Goals  OT Goals(current goals can now be found in the care plan section)  Progress towards OT goals: Progressing toward goals  Acute Rehab OT Goals OT Goal Formulation: With patient Time For Goal Achievement: 04/09/21 Potential to Achieve Goals: Good  Plan Discharge plan remains appropriate    Co-evaluation                 AM-PAC OT "6 Clicks" Daily Activity     Outcome Measure   Help from another person eating meals?: None Help from another person taking care of personal grooming?: A Little Help from another person toileting, which includes using toliet, bedpan, or urinal?: A Little Help from another person bathing (including washing, rinsing, drying)?: A Lot Help from another person to put on and taking off regular upper body clothing?: A Little Help from another person to put on and taking off regular lower body clothing?: A Lot 6 Click Score: 17    End of Session Equipment Utilized During Treatment: Rolling walker (2 wheels)  OT Visit Diagnosis: Unsteadiness on feet (R26.81);Pain   Activity Tolerance Patient tolerated treatment well   Patient Left in bed;with call bell/phone within reach;with family/visitor present   Nurse Communication           Time: 1344-1401 OT Time Calculation (min): 17 min  Charges: OT General Charges $OT Visit: 1 Visit OT Treatments $Self Care/Home Management : 8-22 mins  03/27/2021  RP, OTR/L  Acute Rehabilitation Services  Office:  [phone]   Metta Clines 03/27/2021, 2:07 PM

## 2021-03-27 NOTE — TOC Transition Note (Addendum)
Transition of Care [facility]) - CM/SW Discharge Note   Patient Details  Name: Stacy [name] [facility] MRN: [id number] Date of Birth: 28-Mar-1960  Transition of Care [facility]) CM/SW Contact:  Beckie Busing, RN Phone Number:[phone]  03/27/2021, 10:41 AM   Clinical Narrative:    DME 3 in 1 and Rolling walker ordered per [employer]. DME to be delivered to the room.  1121 Per [employer] patient is not eligible to receive 3in1 or rolling walker because patient received both in 2020 and insurance will only cover every 5 years. CM at bedside to update patient. Patient is ok with this and does not wish to purchase DME.         Patient Goals and CMS Choice        Discharge Placement                       Discharge Plan and Services                                     Social Determinants of Health (SDOH) Interventions     Readmission Risk Interventions No flowsheet data found.

## 2021-03-27 NOTE — Telephone Encounter (Signed)
Pt is currently admitted not able to send in refill for rx

## 2021-03-28 LAB — CBC WITH DIFFERENTIAL/PLATELET
Abs Immature Granulocytes: 0.03 10*3/uL (ref 0.00–0.07)
Abs Immature Granulocytes: 0.03 10*3/uL (ref 0.00–0.07)
Basophils Absolute: 0 10*3/uL (ref 0.0–0.1)
Basophils Absolute: 0 10*3/uL (ref 0.0–0.1)
Basophils Relative: 0 %
Basophils Relative: 0 %
Eosinophils Absolute: 0.1 10*3/uL (ref 0.0–0.5)
Eosinophils Absolute: 0.1 10*3/uL (ref 0.0–0.5)
Eosinophils Relative: 1 %
Eosinophils Relative: 1 %
HCT: 24.4 % — ABNORMAL LOW (ref 36.0–46.0)
HCT: 25.6 % — ABNORMAL LOW (ref 36.0–46.0)
Hemoglobin: 8.3 g/dL — ABNORMAL LOW (ref 12.0–15.0)
Hemoglobin: 8.5 g/dL — ABNORMAL LOW (ref 12.0–15.0)
Immature Granulocytes: 0 %
Immature Granulocytes: 0 %
Lymphocytes Relative: 32 %
Lymphocytes Relative: 23 %
Lymphs Abs: 2.5 10*3/uL (ref 0.7–4.0)
Lymphs Abs: 1.8 10*3/uL (ref 0.7–4.0)
MCH: 26.9 pg (ref 26.0–34.0)
MCH: 26.2 pg (ref 26.0–34.0)
MCHC: 34 g/dL (ref 30.0–36.0)
MCHC: 33.2 g/dL (ref 30.0–36.0)
MCV: 79 fL — ABNORMAL LOW (ref 80.0–100.0)
MCV: 78.8 fL — ABNORMAL LOW (ref 80.0–100.0)
Monocytes Absolute: 0.7 10*3/uL (ref 0.1–1.0)
Monocytes Absolute: 0.7 10*3/uL (ref 0.1–1.0)
Monocytes Relative: 9 %
Monocytes Relative: 9 %
Neutro Abs: 4.5 10*3/uL (ref 1.7–7.7)
Neutro Abs: 5.2 10*3/uL (ref 1.7–7.7)
Neutrophils Relative %: 58 %
Neutrophils Relative %: 67 %
Platelets: 197 10*3/uL (ref 150–400)
Platelets: 214 10*3/uL (ref 150–400)
RBC: 3.09 MIL/uL — ABNORMAL LOW (ref 3.87–5.11)
RBC: 3.25 MIL/uL — ABNORMAL LOW (ref 3.87–5.11)
RDW: 17.3 % — ABNORMAL HIGH (ref 11.5–15.5)
RDW: 17.2 % — ABNORMAL HIGH (ref 11.5–15.5)
WBC: 7.8 10*3/uL (ref 4.0–10.5)
WBC: 7.8 10*3/uL (ref 4.0–10.5)
nRBC: 0 % (ref 0.0–0.2)
nRBC: 0 % (ref 0.0–0.2)

## 2021-03-28 LAB — GLUCOSE, CAPILLARY
Glucose-Capillary: 126 mg/dL — ABNORMAL HIGH (ref 70–99)
Glucose-Capillary: 126 mg/dL — ABNORMAL HIGH (ref 70–99)

## 2021-03-28 MED ORDER — OXYCODONE HCL 5 MG PO TABS: 5.0000 mg | ORAL_TABLET | ORAL | 0 refills | Status: DC | PRN

## 2021-03-28 MED ORDER — DOCUSATE SODIUM 100 MG PO CAPS: 100.0000 mg | ORAL_CAPSULE | Freq: Two times a day (BID) | ORAL | 0 refills | Status: DC

## 2021-03-28 MED ORDER — METHOCARBAMOL 500 MG PO TABS: 500.0000 mg | ORAL_TABLET | Freq: Three times a day (TID) | ORAL | 1 refills | Status: DC | PRN

## 2021-03-28 MED ORDER — OXYCODONE HCL ER 15 MG PO T12A: 15.0000 mg | EXTENDED_RELEASE_TABLET | Freq: Two times a day (BID) | ORAL | 0 refills | Status: DC

## 2021-03-28 MED ORDER — FERROUS GLUCONATE 324 (38 FE) MG PO TABS: 324.0000 mg | ORAL_TABLET | Freq: Two times a day (BID) | ORAL | 1 refills | Status: DC

## 2021-03-28 NOTE — Progress Notes (Addendum)
° ° ° °  Subjective: 3 Days Post-Op Procedure(s) (LRB): LEFT L2-3, L3-4, L4-5 TRANSFORAMINAL LUMBAR INTERBODY FUSIONS WITH RODS, SCREWS AND CAGES, LOCAL BONE GRAFT, ALLOGRAFT BONE GRAFT, VIVIGEN (N/A) Awake, alert and oriented x 4. Voiding well. Did PT and OT and stairs. Hgb returned stable 8.5 this afternoon.   Patient reports pain as moderate.    Objective:   VITALS:  Temp:  [98.2 F (36.8 C)-99.8 F (37.7 C)] 98.5 F (36.9 C) (02/24 1135) Pulse Rate:  [80-105] 90 (02/24 1135) Resp:  [16-17] 17 (02/24 1135) BP: (98-135)/(55-68) 101/68 (02/24 1135) SpO2:  [88 %-96 %] 96 % (02/24 1135)  Neurologically intact ABD soft Neurovascular intact Sensation intact distally Intact pulses distally Dorsiflexion/Plantar flexion intact Incision: scant drainage No cellulitis present   LABS Recent Labs    03/26/21 1511 03/28/21 0626 03/28/21 1116  HGB 9.0* 8.3* 8.5*  WBC 11.3* 7.8 7.8  PLT 227 197 214   Recent Labs    03/26/21 1511  NA 132*  K 3.8  CL 95*  CO2 28  BUN 11  CREATININE 1.06*  GLUCOSE 162*   No results for input(s): LABPT, INR in the last 72 hours.   Assessment/Plan: 3 Days Post-Op Procedure(s) (LRB): LEFT L2-3, L3-4, L4-5 TRANSFORAMINAL LUMBAR INTERBODY FUSIONS WITH RODS, SCREWS AND CAGES, LOCAL BONE GRAFT, ALLOGRAFT BONE GRAFT, VIVIGEN (N/A)  Advance diet Up with therapy D/C IV fluids Discharge home   [name] 03/28/2021, 1:34 PM Patient ID: Stacy [name], female   DOB: 02/12/1960, 61 y.o.   MRN: [id number]

## 2021-03-28 NOTE — Progress Notes (Signed)
Physical Therapy Treatment Patient Details Name: Stacy [name] [facility] MRN: [id number] DOB: 1960-11-02 Today's Date: 03/28/2021   History of Present Illness 61 yo F s/p L2-3, L3-4, L4-5 PLIF.  PMH includes: COPD, Gout, R TKA, L THA, GERD, prior back surgeries, carpal tunnel syndrome.    PT Comments    Plan to be D/C from hospital this afternoon. Pt was able to perform bed mobility, ambulation, and transfers modified independent. She performed a full flight of stairs with min guard for safety and demonstrated proper technique for safe descent. Pt was educated on car transfers and demonstrated understanding with proper technique simulating car entry on recliner. Not recommending therapy post acute. Pt met all of her therapy goals, D/C pt for PT.     Recommendations for follow up therapy are one component of a multi-disciplinary discharge planning process, led by the attending physician.  Recommendations may be updated based on patient status, additional functional criteria and insurance authorization.  Follow Up Recommendations  No PT follow up     Assistance Recommended at Discharge PRN  Patient can return home with the following A little help with walking and/or transfers;A little help with bathing/dressing/bathroom;Assistance with cooking/housework;Assist for transportation;Help with stairs or ramp for entrance   Equipment Recommendations  Rolling walker (2 wheels)    Recommendations for Other Services       Precautions / Restrictions Precautions Precautions: Back Precaution Booklet Issued: Yes (comment) Precaution Comments: Precautions were reviewed today. Required Braces or Orthoses: Spinal Brace Spinal Brace: Thoracolumbosacral orthotic Restrictions Weight Bearing Restrictions: No     Mobility  Bed Mobility Overal bed mobility: Modified Independent   Rolling: Independent Sidelying to sit: Modified independent (Device/Increase time)       General bed mobility  comments: Used railing for sidelying to sit transfer.    Transfers Overall transfer level: Modified independent Equipment used: Rolling walker (2 wheels) Transfers: Sit to/from Stand Sit to Stand: Modified independent (Device/Increase time)                Ambulation/Gait Ambulation/Gait assistance: Modified independent (Device/Increase time) Gait Distance (Feet): 200 Feet Assistive device: Rolling walker (2 wheels) Gait Pattern/deviations: Step-through pattern, Decreased stride length           Stairs Stairs: Yes Stairs assistance: Min guard Stair Management: One rail Left, Forwards, Step to pattern Number of Stairs: 10 General stair comments: Heavy use of railing on left side.   Wheelchair Mobility    Modified Rankin (Stroke Patients Only)       Balance Overall balance assessment: Modified Independent Sitting-balance support: Feet supported Sitting balance-Leahy Scale: Good     Standing balance support: Reliant on assistive device for balance, During functional activity Standing balance-Leahy Scale: Good Standing balance comment: Pt was modified independent with standing balance. She was reliant on her walker with ambulation and had heavy use of railing during stair training.                            Cognition Arousal/Alertness: Awake/alert Behavior During Therapy: WFL for tasks assessed/performed Overall Cognitive Status: Within Functional Limits for tasks assessed                                 General Comments: Pt was in a good mood and more talkative today.        Exercises      General Comments General comments (skin  integrity, edema, etc.): Pt was educated on increasing activity tolerance gradually. Pt was educated on technique with getting in and out of the car with adherance to precautions.      Pertinent Vitals/Pain Pain Assessment Pain Assessment: No/denies pain    Home Living                           Prior Function            PT Goals (current goals can now be found in the care plan section)      Frequency    Min 5X/week      PT Plan Current plan remains appropriate    Co-evaluation              AM-PAC PT "6 Clicks" Mobility   Outcome Measure  Help needed turning from your back to your side while in a flat bed without using bedrails?: None Help needed moving from lying on your back to sitting on the side of a flat bed without using bedrails?: None Help needed moving to and from a bed to a chair (including a wheelchair)?: None Help needed standing up from a chair using your arms (e.g., wheelchair or bedside chair)?: None Help needed to walk in hospital room?: None Help needed climbing 3-5 steps with a railing? : A Little 6 Click Score: 23    End of Session Equipment Utilized During Treatment: Gait belt Activity Tolerance: Patient tolerated treatment well Patient left: in chair;with call bell/phone within reach   PT Visit Diagnosis: Pain;Other abnormalities of gait and mobility (R26.89)     Time: 7159-5396 PT Time Calculation (min) (ACUTE ONLY): 24 min  Charges:  $Gait Training: 8-22 mins $Therapeutic Activity: 8-22 mins                     Quenton Fetter, SPT  Quenton Fetter 03/28/2021, 12:05 PM

## 2021-03-28 NOTE — Progress Notes (Signed)
Occupational Therapy Treatment Patient Details Name: Stacy [name] [facility] MRN: [id number] DOB: [birth year] Today's Date: 03/28/2021   History of present illness 61 yo F s/p L2-3, L3-4, L4-5 PLIF.  PMH includes: COPD, Gout, R TKA, L THA, GERD, prior back surgeries, carpal tunnel syndrome.   OT comments  Patient issued hip kit and able to demonstrate its usage for ADL this date.  Minimal assist with slippers, but otherwise setup assist only.  Patient is preparing for discharge, and will have the needed assist at home.  No post acute OT needed.     Recommendations for follow up therapy are one component of a multi-disciplinary discharge planning process, led by the attending physician.  Recommendations may be updated based on patient status, additional functional criteria and insurance authorization.    Follow Up Recommendations  No OT follow up    Assistance Recommended at Discharge Intermittent Supervision/Assistance  Patient can return home with the following      Equipment Recommendations  BSC/3in1    Recommendations for Other Services      Precautions / Restrictions Precautions Precautions: Back Precaution Booklet Issued: Yes (comment) Precaution Comments: Precautions were reviewed today. Required Braces or Orthoses: Spinal Brace Spinal Brace: Thoracolumbosacral orthotic Restrictions Weight Bearing Restrictions: No       Mobility Bed Mobility                    Transfers Overall transfer level: Modified independent Equipment used: Rolling walker (2 wheels)                     Balance Overall balance assessment: Modified Independent         Standing balance support: Reliant on assistive device for balance, During functional activity Standing balance-Leahy Scale: Good                             ADL either performed or assessed with clinical judgement   ADL       Grooming: Wash/dry hands;Standing;Modified independent                Lower Body Dressing: Minimal assistance;Sit to/from stand;With adaptive equipment   Toilet Transfer: Modified Independent;Rolling walker (2 wheels)                  Extremity/Trunk Assessment Upper Extremity Assessment Upper Extremity Assessment: Overall WFL for tasks assessed       Cervical / Trunk Assessment Cervical / Trunk Assessment: Back Surgery    Vision Baseline Vision/History: 1 Wears glasses Patient Visual Report: No change from baseline     Perception     Praxis      Cognition Arousal/Alertness: Awake/alert Behavior During Therapy: WFL for tasks assessed/performed Overall Cognitive Status: Within Functional Limits for tasks assessed                                          Exercises      Shoulder Instructions       General Comments Pt was educated on increasing activity tolerance gradually. Pt was educated on technique with getting in and out of the car with adherance to precautions.    Pertinent Vitals/ Pain       Pain Assessment Pain Assessment: Faces Faces Pain Scale: Hurts a little bit Pain Location: mild back pain Pain Descriptors / Indicators: Sore Pain Intervention(s):  Monitored during session  Home Living Family/patient expects to be discharged to:: Private residence Living Arrangements: Spouse/significant other                                      Prior Functioning/Environment              Frequency           Progress Toward Goals  OT Goals(current goals can now be found in the care plan section)  Progress towards OT goals: Goals met/education completed, patient discharged from [place] All goals met and education completed, patient discharged from OT services    Co-evaluation                 AM-PAC OT "6 Clicks" Daily Activity     Outcome Measure   Help from another person eating meals?: None Help from another person taking care of personal grooming?:  None Help from another person toileting, which includes using toliet, bedpan, or urinal?: None Help from another person bathing (including washing, rinsing, drying)?: A Little Help from another person to put on and taking off regular upper body clothing?: None Help from another person to put on and taking off regular lower body clothing?: A Little 6 Click Score: 22    End of Session Equipment Utilized During Treatment: Rolling walker (2 wheels)  OT Visit Diagnosis: Pain   Activity Tolerance Patient tolerated treatment well   Patient Left in chair;with call bell/phone within reach   Nurse Communication          Time: 1135-1200 OT Time Calculation (min): 25 min  Charges: OT General Charges $OT Visit: 1 Visit OT Treatments $Self Care/Home Management : 23-37 mins  03/28/2021  RP, OTR/L  Acute Rehabilitation Services  Office:  [phone]   Stacy [name] 03/28/2021, 1:41 PM

## 2021-03-31 ENCOUNTER — Other Ambulatory Visit: Payer: Self-pay

## 2021-03-31 ENCOUNTER — Other Ambulatory Visit: Payer: Self-pay | Admitting: Specialist

## 2021-03-31 ENCOUNTER — Telehealth: Payer: Self-pay

## 2021-03-31 ENCOUNTER — Telehealth: Payer: Self-pay | Admitting: Specialist

## 2021-03-31 MED ORDER — OXYCODONE HCL ER 15 MG PO T12A: 15.0000 mg | EXTENDED_RELEASE_TABLET | Freq: Two times a day (BID) | ORAL | 0 refills | Status: DC

## 2021-03-31 NOTE — Telephone Encounter (Signed)
I called and advsied patient that she needs to get gauze from the pharmacy and tape, and this would allow her to put a dressing on it till she came in.

## 2021-03-31 NOTE — Telephone Encounter (Signed)
Transition Care Management Unsuccessful Follow-up Telephone Call  Date of discharge and from where:  03/28/2021 from [facility]  Attempts:  1st Attempt  Reason for unsuccessful TCM follow-up call:  Left voice message

## 2021-03-31 NOTE — Telephone Encounter (Signed)
Patient has a post op appointment on 3/16 but she only has 2 wound patches left. She needs guidance on what to do. She also needs a refill for her pain meds at the [facility] on [address]. Her pharmacy does not have the medication on hand.

## 2021-03-31 NOTE — Telephone Encounter (Signed)
Pt calling for her oxycodone to be sent in to [facility] on [address]. [facility] does not have it!

## 2021-03-31 NOTE — Telephone Encounter (Signed)
Patient states that it is only the 15mg  Oxycodone that needs to be sent to [facility] on [address]

## 2021-03-31 NOTE — Telephone Encounter (Signed)
Transition Care Management Follow-up Telephone Call Date of discharge and from where: 03/28/2021, [facility]  How have you been since you were released from the hospital? She stated that she is doing pretty good, taking it easy.  Any questions or concerns? Yes - she did not receive the oxycontin, and stated her pharmacy did not receive the prescription. Instructed her to call the surgeon's office.  She also said she has some drainage from her surgical site and is running out of dressing supplies.  Instructed her to call the surgeon's office about this also.   Items Reviewed: Did the pt receive and understand the discharge instructions provided? Yes  Medications obtained and verified? Yes  - she has all medications except the oxycontin and she did not have any questions about her med regime.  Other? No  Any new allergies since your discharge? No  Dietary orders reviewed? Yes Do you have support at home? Yes , her husband  Home Care and Equipment/Supplies: Were home health services ordered? no If so, what is the name of the agency? N/a  Has the agency set up a time to come to the patient's home? not applicable Were any new equipment or medical supplies ordered?  No What is the name of the medical supply agency? N/a Were you able to get the supplies/equipment? not applicable Do you have any questions related to the use of the equipment or supplies? No  She has a nebulizer, glucometer , RW and BSC.  Blood sugar this morning: 109 BP this morning: 121/89  Functional Questionnaire: (I = Independent and D = Dependent) ADLs: independent but her husband is providing any needed assistance. Wears her back brace when out of bed.    Follow up appointments reviewed:  PCP Hospital f/u appt confirmed? Yes  Scheduled to see Dr Laural Benes  - 05/01/2021. She did not want to see another provider to be seen sooner.  Specialist Hospital f/u appt confirmed? Yes  Scheduled to see orthopedic surgery on  04/17/2021.  Are transportation arrangements needed? No  If their condition worsens, is the pt aware to call PCP or go to the Emergency Dept.? Yes Was the patient provided with contact information for the PCP's office or ED? Yes Was to pt encouraged to call back with questions or concerns? Yes

## 2021-03-31 NOTE — Addendum Note (Signed)
Addended by: Minda Ditto, Geoffery Spruce on: 03/31/2021 03:01 PM   Modules accepted: Orders

## 2021-04-01 ENCOUNTER — Other Ambulatory Visit: Payer: Self-pay | Admitting: Internal Medicine

## 2021-04-01 ENCOUNTER — Other Ambulatory Visit (INDEPENDENT_AMBULATORY_CARE_PROVIDER_SITE_OTHER): Payer: Self-pay | Admitting: Specialist

## 2021-04-01 ENCOUNTER — Telehealth: Payer: Self-pay | Admitting: Specialist

## 2021-04-01 DIAGNOSIS — E1142 Type 2 diabetes mellitus with diabetic polyneuropathy: Secondary | ICD-10-CM

## 2021-04-01 DIAGNOSIS — F17200 Nicotine dependence, unspecified, uncomplicated: Secondary | ICD-10-CM

## 2021-04-01 NOTE — Telephone Encounter (Signed)
I have submitted this on Cover My Meds, pending their approval

## 2021-04-01 NOTE — Telephone Encounter (Signed)
Pt called. She states the medication called in is not covered by her insurance. She is wondering if she can have something else called in that is covered by her insurance.

## 2021-04-02 DIAGNOSIS — F331 Major depressive disorder, recurrent, moderate: Secondary | ICD-10-CM | POA: Diagnosis not present

## 2021-04-02 NOTE — Telephone Encounter (Signed)
I called and advised patient that I got her pain meds approved thru her insurance. ? ?Outcome ?Approved on February 28 ?Request Reference Number: [id number]. OXYCONTIN TAB 15MG  ER is approved through 06/29/2021. For further questions, call 07/01/2021 at [phone]. ?

## 2021-04-03 ENCOUNTER — Other Ambulatory Visit: Payer: Self-pay | Admitting: Internal Medicine

## 2021-04-03 DIAGNOSIS — I739 Peripheral vascular disease, unspecified: Secondary | ICD-10-CM

## 2021-04-03 MED ORDER — CLOPIDOGREL BISULFATE 75 MG PO TABS: ORAL_TABLET | ORAL | 0 refills | Status: DC

## 2021-04-03 NOTE — Telephone Encounter (Signed)
[facility] called about the refill request for clopidogrel (PLAVIX) 75 MG tablet/ they said this was urgent and they have requested it before with no response / please advise asap  ?

## 2021-04-03 NOTE — Telephone Encounter (Signed)
Requested medication (s) are due for refill today: yes ? ?Requested medication (s) are on the active medication list: yes ? ?Last refill:  01/03/21 #30/2 ? ?Future visit scheduled: yes ? ?Notes to clinic:  Unable to refill per protocol due to failed labs, no updated results. ? ? ? ?  ?Requested Prescriptions  ?Pending Prescriptions Disp Refills  ? clopidogrel (PLAVIX) 75 MG tablet 30 tablet 2  ?  Sig: TAKE 1 TABLET (75 MG TOTAL) BY MOUTH DAILY. (AM)  ?  ? Hematology: Antiplatelets - clopidogrel Failed - 04/03/2021 11:48 AM  ?  ?  Failed - HCT in normal range and within 180 days  ?  HCT  ?Date Value Ref Range Status  ?03/28/2021 25.6 (L) 36.0 - 46.0 % Final  ? ?Hematocrit  ?Date Value Ref Range Status  ?01/20/2021 32.6 (L) 34.0 - 46.6 % Final  ?  ?  ?  ?  Failed - HGB in normal range and within 180 days  ?  Hemoglobin  ?Date Value Ref Range Status  ?03/28/2021 8.5 (L) 12.0 - 15.0 g/dL Final  ?01/20/2021 10.1 (L) 11.1 - 15.9 g/dL Final  ?  ?  ?  ?  Failed - Cr in normal range and within 360 days  ?  Creat  ?Date Value Ref Range Status  ?03/11/2016 0.90 0.50 - 1.05 mg/dL Final  ?  Comment:  ?    ?For patients > or = 62 years of age: The upper reference limit for ?Creatinine is approximately 13% higher for people identified as ?African-American. ?  ?  ? ?Creatinine, Ser  ?Date Value Ref Range Status  ?03/26/2021 1.06 (H) 0.44 - 1.00 mg/dL Final  ? ?Creatinine, POC  ?Date Value Ref Range Status  ?06/10/2016 200 mg/dL Final  ?  ?  ?  ?  Passed - PLT in normal range and within 180 days  ?  Platelets  ?Date Value Ref Range Status  ?03/28/2021 214 150 - 400 K/uL Final  ?01/20/2021 393 150 - 450 x10E3/uL Final  ?  ?  ?  ?  Passed - Valid encounter within last 6 months  ?  Recent Outpatient Visits   ? ?      ? 2 months ago Preoperative evaluation to rule out surgical contraindication  ? [place] Karle Plumber B, MD  ? 3 months ago Pap smear for cervical cancer screening  ? [place] [name], MD  ? 10 months ago Type 2 diabetes mellitus with diabetic polyneuropathy, without long-term current use of insulin ([place])  ? [place] [name], MD  ? 1 year ago Type 2 diabetes mellitus with diabetic polyneuropathy, without long-term current use of insulin ([place])  ? [place] [name], MD  ? 1 year ago Need for influenza vaccination  ? [place], RPH-CPP  ? ?  ?  ?Future Appointments   ? ?        ? In 4 weeks [name], MD [place]  ? ?  ? ?  ?  ?  ? ?

## 2021-04-03 NOTE — Telephone Encounter (Signed)
Copied from [place] [id number]. Topic: Quick Communication - Rx Refill/Question ?>> Apr 03, 2021 11:45 AM Tessa Lerner A wrote: ?Medication: clopidogrel (PLAVIX) 75 MG tablet [id number]  ? ?Has the patient contacted their pharmacy? Yes.  The pharmacy is making contact on the patient's behalf  ?(Agent: If no, request that the patient contact the pharmacy for the refill. If patient does not wish to contact the pharmacy document the reason why and proceed with request.) ?(Agent: If yes, when and what did the pharmacy advise?) ? ?Preferred Pharmacy (with phone number or street name): [place], [place] - [address] ?[place] [address] ?Phone: [phone] Fax: [phone] ?Hours: Not open 24 hours ? ?Has the patient been seen for an appointment in the last year OR does the patient have an upcoming appointment? Yes.   ? ?Agent: Please be advised that RX refills may take up to 3 business days. We ask that you follow-up with your pharmacy. ?

## 2021-04-03 NOTE — Telephone Encounter (Signed)
Attempted to contact office to review requested medication. No answer, did not leave message. Will send request again  ?

## 2021-04-03 NOTE — Telephone Encounter (Signed)
Requested medication (s) are due for refill today:  yes  ? ?Requested medication (s) are on the active medication list: yes  ? ?Last refill:  01/03/21 #30  2 refills  ? ?Future visit scheduled: yes in 4 weeks  ? ?Notes to clinic:  unable to refill per protocol due to failed labs. Pharmacy requesting refill. Please advise  ? ? ?  ?Requested Prescriptions  ?Pending Prescriptions Disp Refills  ? clopidogrel (PLAVIX) 75 MG tablet 30 tablet 2  ?  Sig: TAKE 1 TABLET (75 MG TOTAL) BY MOUTH DAILY. (AM)  ?  ? Hematology: Antiplatelets - clopidogrel Failed - 04/03/2021  3:28 PM  ?  ?  Failed - HCT in normal range and within 180 days  ?  HCT  ?Date Value Ref Range Status  ?03/28/2021 25.6 (L) 36.0 - 46.0 % Final  ? ?Hematocrit  ?Date Value Ref Range Status  ?01/20/2021 32.6 (L) 34.0 - 46.6 % Final  ?  ?  ?  ?  Failed - HGB in normal range and within 180 days  ?  Hemoglobin  ?Date Value Ref Range Status  ?03/28/2021 8.5 (L) 12.0 - 15.0 g/dL Final  ?95/18/8416 60.6 (L) 11.1 - 15.9 g/dL Final  ?  ?  ?  ?  Failed - Cr in normal range and within 360 days  ?  Creat  ?Date Value Ref Range Status  ?03/11/2016 0.90 0.50 - 1.05 mg/dL Final  ?  Comment:  ?    ?For patients > or = 62 years of age: The upper reference limit for ?Creatinine is approximately 13% higher for people identified as ?African-American. ?  ?  ? ?Creatinine, Ser  ?Date Value Ref Range Status  ?03/26/2021 1.06 (H) 0.44 - 1.00 mg/dL Final  ? ?Creatinine, POC  ?Date Value Ref Range Status  ?06/10/2016 200 mg/dL Final  ?  ?  ?  ?  Passed - PLT in normal range and within 180 days  ?  Platelets  ?Date Value Ref Range Status  ?03/28/2021 214 150 - 400 K/uL Final  ?01/20/2021 393 150 - 450 x10E3/uL Final  ?  ?  ?  ?  Passed - Valid encounter within last 6 months  ?  Recent Outpatient Visits   ? ?      ? 2 months ago Preoperative evaluation to rule out surgical contraindication  ? [facility] Jonah Blue B, MD  ? 3 months ago Pap smear for  cervical cancer screening  ? [facility] Marcine Matar, MD  ? 10 months ago Type 2 diabetes mellitus with diabetic polyneuropathy, without long-term current use of insulin (HCC)  ? [facility] Marcine Matar, MD  ? 1 year ago Type 2 diabetes mellitus with diabetic polyneuropathy, without long-term current use of insulin (HCC)  ? [facility] Marcine Matar, MD  ? 1 year ago Need for influenza vaccination  ? [facility] Lois Huxley, Cornelius Moras, RPH-CPP  ? ?  ?  ?Future Appointments   ? ?        ? In 4 weeks Marcine Matar, MD [facility]  ? ?  ? ?  ?  ?  ? ?

## 2021-04-07 ENCOUNTER — Ambulatory Visit: Payer: Self-pay

## 2021-04-07 ENCOUNTER — Other Ambulatory Visit: Payer: Self-pay | Admitting: Internal Medicine

## 2021-04-07 DIAGNOSIS — J449 Chronic obstructive pulmonary disease, unspecified: Secondary | ICD-10-CM

## 2021-04-07 NOTE — Telephone Encounter (Signed)
?  Chief Complaint: itching ?Symptoms: all over itching, head to toe ?Frequency: 3 days ?Pertinent Negatives: Patient denies any spots on skin, using different soap or detergent.  ?Disposition: [] ED /[] Urgent Care (no appt availability in office) / [x] Appointment(In office/virtual)/ []  [place] Virtual [facility]/ [] Home Care/ [] Refused Recommended Disposition /[] [place] Mobile [facility]/ []  Follow-up with PCP ?Additional Notes:  ? ? ?Reason for Disposition ? [1] Widespread itching AND [2] cause unknown AND [3] present > 48 hours (Exception: caller knows the cause and can eliminate it) ? ?Answer Assessment - Initial Assessment Questions ?1. DESCRIPTION: "Describe the itching you are having." ?    Itching all over head to toe ?2. SEVERITY: "How bad is it?"  ?  - MILD - doesn't interfere with normal activities ?  - MODERATE-SEVERE: interferes with work, school, sleep, or other activities  ?    moderate ?3. SCRATCHING: "Are there any scratch marks? Bleeding?" ?    No ?4. ONSET: "When did this begin?"  ?    3 days  ?5. CAUSE: "What do you think is causing the itching?" (ask about swimming pools, pollen, animals, soaps, etc.) ?    no ?6. OTHER SYMPTOMS: "Do you have any other symptoms?"  ?    No ? ?Protocols used: Itching - Widespread-A-AH ? ?

## 2021-04-08 ENCOUNTER — Ambulatory Visit: Payer: Medicaid Other | Attending: Nurse Practitioner | Admitting: Nurse Practitioner

## 2021-04-08 ENCOUNTER — Encounter: Payer: Self-pay | Admitting: Nurse Practitioner

## 2021-04-08 ENCOUNTER — Other Ambulatory Visit: Payer: Self-pay

## 2021-04-08 DIAGNOSIS — L299 Pruritus, unspecified: Secondary | ICD-10-CM | POA: Diagnosis not present

## 2021-04-08 MED ORDER — PERMETHRIN 5 % EX CREA: TOPICAL_CREAM | CUTANEOUS | 0 refills | Status: DC

## 2021-04-08 MED ORDER — PREDNISONE 20 MG PO TABS: 40.0000 mg | ORAL_TABLET | Freq: Every day | ORAL | 0 refills | Status: AC

## 2021-04-08 NOTE — Progress Notes (Addendum)
Virtual Visit via Telephone Note Due to national recommendations of social distancing due to COVID 19, telehealth visit is felt to be most appropriate for this patient at this time.  I discussed the limitations, risks, security and privacy concerns of performing an evaluation and management service by telephone and the availability of in person appointments. I also discussed with the patient that there may be a patient responsible charge related to this service. The patient expressed understanding and agreed to proceed.    I connected with Stacy [name] on 04/08/21  at   9:50 AM EST  EDT by telephone and verified that I am speaking with the correct person using two identifiers.  Location of Patient: Private Residence   [facility] of Provider: [facility]    Persons participating in Telemedicine visit: Bertram Denver FNP-BC Stacy Pigeon Helton    History of Present Illness: Telemedicine visit for: Generalized pruritis  Notes intense itching all over her body over the past 3 days. She was dc'd from the hospital on 03-28-2021 after having lumbar surgery. She also received a blood transfusion while she was there. Denies being started on any new medications recently or being exposed to any new foods or detergents, lotions, soaps. No one else in the home is experiencing similar conditions.  DDX today include: allergic reaction to blood transfusion, pruritis related to chronic anemia, Scabies It does appear she is taking a different iron tablet post operatively.   Will treat with prednisone and permethrin (although I do not believe this is a scabies condition). She is aware to not take celebrex while taking prednisone.   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   Asthma    COPD (chronic obstructive pulmonary disease) (HCC)     Depression    Diabetes mellitus    Type II   GERD (gastroesophageal reflux disease)    Pt on Protonix daily   Glaucoma    Gout    Headache(784.0)    otc meds prn   Hyperlipidemia    Hypertension    Irritable bowel syndrome 11/19/2010   Neuropathy    Pneumonia YRS AGO   Restless legs    Shortness of breath    07/14/2018- uses  4 times a day   Sleep apnea     Past Surgical History:  Procedure Laterality Date   CHOLECYSTECTOMY     COLONOSCOPY     ENDOMETRIAL ABLATION  10/2010   HERNIA REPAIR     umbicial hernia   JOINT REPLACEMENT Left 03/14/2019   Dr. Magnus Ivan hip   KNEE ARTHROSCOPY Left    06/07/2017 Dr. August [facility] [place] [facility]   KNEE ARTHROSCOPY Left 03/21/2020   Procedure: LEFT KNEE ARTHROSCOPY WITH PARTIAL MEDIAL MENISCECTOMY;  Surgeon: [name], MD;  Location: [facility];  Service: Orthopedics;  Laterality: Left;   KNEE CLOSED REDUCTION Right 12/06/2015   Procedure: CLOSED MANIPULATION RIGHT KNEE;  Surgeon: Kerrin Champagne, MD;  Location: [facility] OR;  Service: Orthopedics;  Laterality: Right;   KNEE CLOSED REDUCTION Right 01/17/2016   Procedure: CLOSED MANIPULATION RIGHT KNEE;  Surgeon: Kerrin Champagne, MD;  Location: [facility];  Service: Orthopedics;  Laterality: Right;   KNEE JOINT MANIPULATION Right 12/06/2015   LACRIMAL TUBE INSERTION Bilateral 03/01/2019   Procedure: LACRIMAL TUBE INSERTION;  Surgeon: Aura Camps, MD;  Location: [facility];  Service: Ophthalmology;  Laterality: Bilateral;   LACRIMAL TUBE REMOVAL Bilateral 05/03/2019   Procedure: BILATERAL NASOLACRIMAL DUCT PROBING, IIRIGATION AND TUBE REMOVAL BOTH EYES;  Surgeon: Aura Camps, MD;  Location: [facility];  Service: Ophthalmology;  Laterality: Bilateral;   LUMBAR LAMINECTOMY/DECOMPRESSION MICRODISCECTOMY N/A 06/03/2015   Procedure: Bilateral lateral recess decompression L2-3, L3-4, L4-5;  Surgeon: Kerrin Champagne, MD;  Location: [facility];  Service:  Orthopedics;  Laterality: N/A;   LUMBAR LAMINECTOMY/DECOMPRESSION MICRODISCECTOMY N/A 10/02/2016   Procedure: Right L5-S1 Lateral Recess Decompression  microdiscectomy;  Surgeon: Kerrin Champagne, MD;  [facility];  Service: Orthopedics;  Laterality: N/A;   LUMBAR LAMINECTOMY/DECOMPRESSION MICRODISCECTOMY N/A 07/15/2018   Procedure: LEFT L3-4 MICRODISCECTOMY;  Surgeon: Kerrin Champagne, MD;  Location: [facility] OR;  Service: Orthopedics;  Laterality: N/A;   svd      x 2   TEAR DUCT PROBING Bilateral 03/01/2019   Procedure: TEAR DUCT PROBING WITH IRRIGATION;  Surgeon: Aura Camps, MD;  Location: [facility];  Service: Ophthalmology;  Laterality: Bilateral;   TOTAL HIP ARTHROPLASTY Left 03/14/2019   Procedure: LEFT TOTAL HIP ARTHROPLASTY ANTERIOR APPROACH;  Surgeon: [name], MD;  [facility];  Service: Orthopedics;  Laterality: Left;   TOTAL KNEE ARTHROPLASTY Right 09/06/2015   Procedure: RIGHT TOTAL KNEE ARTHROPLASTY;  Surgeon: Kerrin Champagne, MD;  Location: [facility];  Service: Orthopedics;  Laterality: Right;   TUBAL LIGATION     UPPER GASTROINTESTINAL ENDOSCOPY  04/28/2011    Family History  Problem Relation Age of Onset   Hypertension Father    Cancer Father    Heart disease Mother    Asthma Son        had as a child   Heart disease Sister    Breast cancer Sister    Hypertension Brother     Social History   Socioeconomic History   Marital status: Married    Spouse name: Not on file   Number of children: 2   Years of education: Not on file   Highest education level: Not on file  Occupational History   Occupation: unemployed    Employer: UNEMPLOYED  Tobacco Use   Smoking status: Some Days    Packs/day: 0.25    Years: 32.00    Pack years: 8.00    Types: Cigarettes   Smokeless tobacco: Never   Tobacco comments:    About 1-2 cigarettes a day  Vaping Use   Vaping Use: Never used  Substance and Sexual Activity   Alcohol use: No   Drug use: No    Sexual activity: Yes    Birth control/protection: Surgical, Post-menopausal    Comment: tubal ligation  Other Topics Concern   Not on file  Social History Narrative   Left Handed    Lives in a two story apartment.    Drinks Caffeine    Social Determinants of Corporate investment banker Strain: Not on file  Food Insecurity: No Food Insecurity   Worried About Programme researcher, broadcasting/film/video in the Last Year: Never true   Ran Out of Food in the Last Year: Never true  Transportation Needs: No Transportation Needs   Lack of Transportation (Medical): No   Lack of Transportation (Non-Medical): No  Physical Activity: Not on file  Stress: Not on file  Social Connections: Not on file     Observations/Objective: Awake, alert and oriented x 3   Review of Systems  Constitutional:  Negative for fever, malaise/fatigue and weight loss.  HENT: Negative.  Negative for nosebleeds.   Eyes: Negative.  Negative for blurred vision, double vision and photophobia.  Respiratory: Negative.  Negative for cough and shortness of breath.   Cardiovascular: Negative.  Negative for chest pain, palpitations and leg swelling.  Gastrointestinal: Negative.  Negative for heartburn, nausea and vomiting.  Musculoskeletal: Negative.  Negative for myalgias.  Skin:  Positive for itching. Negative for rash.  Neurological: Negative.  Negative for dizziness, focal weakness, seizures and headaches.  Psychiatric/Behavioral: Negative.  Negative for suicidal ideas.    Assessment and Plan: Diagnoses and all orders for this visit:  Generalized pruritus -     predniSONE (DELTASONE) 20 MG tablet; Take 2 tablets (40 mg total) by mouth daily with breakfast for 5 days. STOP CELEBREX FOR THE NEXT 5 days -     permethrin (ELIMITE) 5 % cream; Apply to all areas of the body from the neck to soles of feet leave on for 8 to 14 hours before removing by washing (shower or bath).     Follow Up Instructions Return if symptoms worsen or fail to  improve.     I discussed the assessment and treatment plan with the patient. The patient was provided an opportunity to ask questions and all were answered. The patient agreed with the plan and demonstrated an understanding of the instructions.   The patient was advised to call back or seek an in-person evaluation if the symptoms worsen or if the condition fails to improve as anticipated.  I provided 12 minutes of non-face-to-face time during this encounter including median intraservice time, reviewing previous notes, labs, imaging, medications and explaining diagnosis and management.  Claiborne Rigg, FNP-BC

## 2021-04-08 NOTE — Telephone Encounter (Signed)
Requested Prescriptions  ?Pending Prescriptions Disp Refills  ?? albuterol (PROVENTIL) (2.5 MG/3ML) 0.083% nebulizer solution [Pharmacy Med Name: ALBUTEROL SULFATE (2.5 MG/3ML) 0.083% INHALATION NEBULIZATION SOLUTION] 360 mL 0  ?  Sig: USE ONE VIAL (2.5 MG TOTAL) BY NEBULIZATION EVERY 6 (SIX) HOURS AS NEEDED FOR WHEEZING.  ?  ? Pulmonology:  Beta Agonists 2 Passed - 04/07/2021  4:25 PM  ?  ?  Passed - Last BP in normal range  ?  BP Readings from Last 1 Encounters:  ?03/28/21 101/68  ?   ?  ?  Passed - Last Heart Rate in normal range  ?  Pulse Readings from Last 1 Encounters:  ?03/28/21 90  ?   ?  ?  Passed - Valid encounter within last 12 months  ?  Recent Outpatient Visits   ?      ? Today Generalized pruritus  ? [place] Starr, [place] W, NP  ? 2 months ago Preoperative evaluation to rule out surgical contraindication  ? [place] Karle Plumber B, MD  ? 3 months ago Pap smear for cervical cancer screening  ? [place] [name], MD  ? 10 months ago Type 2 diabetes mellitus with diabetic polyneuropathy, without long-term current use of insulin ([place])  ? [place] [name], MD  ? 1 year ago Type 2 diabetes mellitus with diabetic polyneuropathy, without long-term current use of insulin ([place])  ? [place] [name], MD  ?  ?  ?Future Appointments   ?        ? In 3 weeks [name], MD [place]  ?  ? ?  ?  ?  ? ?

## 2021-04-10 ENCOUNTER — Ambulatory Visit: Payer: Self-pay | Admitting: *Deleted

## 2021-04-10 NOTE — Telephone Encounter (Signed)
Summary: vomiting,nausea.  ? Pt stated she vomited once this morning and feels a lot of nausea.  ? ?Pt seeking clinical advice.   ?  ? ? ? ?Chief Complaint: requesting refill on phenergan 12.5 mg for nause ?Symptoms: vomiting x 1 this am and now nausea, drinking only gingerale . Has not eaten since this am . Reports some abdominal cramping.  ?Frequency: this am  ?Pertinent Negatives: Patient denies fever, diarrhea,  ?Disposition: [] ED /[] Urgent Care (no appt availability in office) / [] Appointment(In office/virtual)/ []  [place] [facility]/ [x] Home Care/ [] Refused Recommended Disposition /[] [place] Mobile [facility]/ []  Follow-up with PCP ?Additional Notes:  ? ?Patient requesting phenergan refill and medication not on medication list . Please advise. Offered Mobile Bus if sx worsen.   ? ? ? ? ? ? ? ?Reason for Disposition ? MILD or MODERATE vomiting (e.g., 1 - 5 times / day) ? ?Answer Assessment - Initial Assessment Questions ?1. VOMITING SEVERITY: "How many times have you vomited in the past 24 hours?"  ?   - MILD:  1 - 2 times/day ?   - MODERATE: 3 - 5 times/day, decreased oral intake without significant weight loss or symptoms of dehydration ?   - SEVERE: 6 or more times/day, vomits everything or nearly everything, with significant weight loss, symptoms of dehydration  ?    Mild x 1  ?2. ONSET: "When did the vomiting begin?"  ?    This am  ?3. FLUIDS: "What fluids or food have you vomited up today?" "Have you been able to keep any fluids down?" ?    Sipping on gingerale  ?4. ABDOMINAL PAIN: "Are your having any abdominal pain?" If yes : "How bad is it and what does it feel like?" (e.g., crampy, dull, intermittent, constant)  ?    Low abd. Crampy  ?5. DIARRHEA: "Is there any diarrhea?" If Yes, ask: "How many times today?"  ?    No  ?6. CONTACTS: "Is there anyone else in the family with the same symptoms?"  ?    No  ?7. CAUSE: "What do you think is causing your vomiting?" ?    Not sure  ?8. HYDRATION STATUS: "Any  signs of dehydration?" (e.g., dry mouth [not only dry lips], too weak to stand) "When did you last urinate?" ?    no ?9. OTHER SYMPTOMS: "Do you have any other symptoms?" (e.g., fever, headache, vertigo, vomiting blood or coffee grounds, recent head injury) ?    No  ?10. PREGNANCY: "Is there any chance you are pregnant?" "When was your last menstrual period?" ?      na ? ?Protocols used: Vomiting-A-AH ? ?

## 2021-04-11 MED ORDER — PROMETHAZINE HCL 12.5 MG PO TABS: 12.5000 mg | ORAL_TABLET | Freq: Two times a day (BID) | ORAL | 0 refills | Status: DC | PRN

## 2021-04-17 ENCOUNTER — Ambulatory Visit: Payer: Self-pay

## 2021-04-17 ENCOUNTER — Encounter: Payer: Self-pay | Admitting: Surgery

## 2021-04-17 ENCOUNTER — Other Ambulatory Visit: Payer: Self-pay

## 2021-04-17 ENCOUNTER — Ambulatory Visit (INDEPENDENT_AMBULATORY_CARE_PROVIDER_SITE_OTHER): Payer: Medicaid Other | Admitting: Surgery

## 2021-04-17 DIAGNOSIS — M545 Low back pain, unspecified: Secondary | ICD-10-CM

## 2021-04-17 DIAGNOSIS — Z981 Arthrodesis status: Secondary | ICD-10-CM

## 2021-04-17 MED ORDER — OXYCODONE HCL 5 MG PO TABS: 5.0000 mg | ORAL_TABLET | Freq: Four times a day (QID) | ORAL | 0 refills | Status: DC | PRN

## 2021-04-17 MED ORDER — OXYCODONE HCL ER 15 MG PO T12A: 15.0000 mg | EXTENDED_RELEASE_TABLET | Freq: Two times a day (BID) | ORAL | 0 refills | Status: DC

## 2021-04-17 NOTE — Progress Notes (Signed)
? ?Post-Op Visit Note ?  ?Patient: Stacy [name]           ?Date of Birth: [birth year]           ?MRN: [id number] ?Visit Date: 04/17/2021 ?PCP: Marcine Matar, MD ? ? ?Assessment & Plan: ? ?Chief Complaint:  ?Chief Complaint  ?Patient presents with  ? Lower Back - Pain  ?61 year old black female returns.  She is 3 weeks postop.  States that she is doing well.  Preop leg pain much improved.  Needs refill of pain medication ?Visit Diagnoses:  ?1. Low back pain, unspecified back pain laterality, unspecified chronicity, unspecified whether sciatica present   ?2. S/P lumbar fusion   ? ? ?Plan: Reviewed lumbar x-rays with patient today.  Hardware intact.  Refilled oxycodone and OxyContin.  Continue brace.  Follow-up with Dr. [name] in 4 weeks for recheck. ? ?Follow-Up Instructions: Return in about 4 weeks (around 05/15/2021) for With Dr. [name] for postop check lumbar fusion.  ? ?Orders:  ?Orders Placed This Encounter  ?Procedures  ? XR Lumbar Spine 2-3 Views  ? ?Meds ordered this encounter  ?Medications  ? oxyCODONE (OXYCONTIN) 15 mg 12 hr tablet  ?  Sig: Take 1 tablet (15 mg total) by mouth every 12 (twelve) hours.  ?  Dispense:  14 tablet  ?  Refill:  0  ? oxyCODONE (OXY IR/ROXICODONE) 5 MG immediate release tablet  ?  Sig: Take 1 tablet (5 mg total) by mouth every 6 (six) hours as needed for moderate pain ((score 4 to 6)).  ?  Dispense:  40 tablet  ?  Refill:  0  ? ? ?Imaging: ?No results found. ? ?PMFS History: ?Patient Active Problem List  ? Diagnosis Date Noted  ? Fusion of spine of lumbar region 03/25/2021  ? Colon polyps 06/06/2020  ? Lupus (HCC) 06/06/2020  ? Allergic rhinitis due to animal (cat) (dog) hair and dander 04/08/2020  ? Allergic rhinitis due to pollen 04/08/2020  ? Food allergy 04/08/2020  ? Acute medial meniscus tear, left, subsequent encounter 03/21/2020  ? Chronic pain of left knee 02/15/2020  ? Paresthesia of skin 11/16/2019  ? History of total knee replacement, right 11/16/2019  ?  Tobacco abuse 11/16/2019  ? Centrilobular emphysema (HCC) 08/10/2019  ? Incidental lung nodule, > 39mm and < 44mm 08/10/2019  ? OSA on CPAP 08/10/2019  ? Dyspnea on exertion 08/02/2019  ? Hyperlipidemia 08/02/2019  ? Lumbar radiculopathy 04/24/2019  ? 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  ? Post laminectomy syndrome 02/23/2019  ? Abnormality of gait 02/23/2019  ? 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  ? Unilateral primary osteoarthritis, left hip 12/28/2018  ? Lesion of skin of left ear 12/26/2018  ? Primary osteoarthritis of left hip 12/02/2018  ? Iron deficiency anemia 10/16/2018  ? Chronic pain syndrome 09/08/2018  ? Chronic pain of right knee 08/11/2018  ? Status post lumbar laminectomy 07/15/2018  ? Peripheral arterial disease (HCC) 04/05/2018  ? Moderate persistent asthma without complication 06/29/2017  ? Environmental and seasonal allergies 06/29/2017  ? Controlled type 2 diabetes mellitus with diabetic polyneuropathy, without long-term current use of insulin (HCC) 06/29/2017  ? Perennial allergic rhinitis 04/08/2017  ? Sensorineural hearing loss (SNHL), bilateral 04/08/2017  ? Chronic pansinusitis 03/25/2017  ? Eustachian tube dysfunction, bilateral 03/25/2017  ? 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 Herniation of lumbar intervertebral disc with radiculopathy 10/02/2016  ?  Class: Chronic  ? Alopecia areata 08/19/2016  ? Chondromalacia of both patellae 06/03/2015  ?  Class: Chronic  ? Spinal stenosis, lumbar region, with neurogenic claudication 06/03/2015  ? Tobacco use disorder 04/25/2015  ? DJD (degenerative joint disease) of knee 01/04/2015  ? Hemorrhoid 11/14/2014  ? Gout of big toe 07/19/2014  ? Essential hypertension 08/14/2013  ? Gastroesophageal reflux disease without esophagitis 08/14/2013  ? COPD (chronic obstructive pulmonary disease) (HCC) 04/17/2011  ? ?Past Medical History:  ?Diagnosis Date  ? Allergy   ? Shellfish, cleaning products  ? Anxiety   ? Arthritis   ? Arthrofibrosis of total knee  replacement (HCC)   ? right  ? Asthma   ? COPD (chronic obstructive pulmonary disease) (HCC)   ? Depression   ? Diabetes mellitus   ? Type II  ? GERD (gastroesophageal reflux disease)   ? Pt on Protonix daily  ? Glaucoma   ? Gout   ? Headache(784.0)   ? otc meds prn  ? Hyperlipidemia   ? Hypertension   ? Irritable bowel syndrome 11/19/2010  ? Neuropathy   ? Pneumonia YRS AGO  ? Restless legs   ? Shortness of breath   ? 07/14/2018- uses  4 times a day  ? Sleep apnea   ?  ?Family History  ?Problem Relation Age of Onset  ? Hypertension Father   ? Cancer Father   ? Heart disease Mother   ? Asthma Son   ?     had as a child  ? Heart disease Sister   ? Breast cancer Sister   ? Hypertension Brother   ?  ?Past Surgical History:  ?Procedure Laterality Date  ? CHOLECYSTECTOMY    ? COLONOSCOPY    ? ENDOMETRIAL ABLATION  10/2010  ? HERNIA REPAIR    ? umbicial hernia  ? JOINT REPLACEMENT Left 03/14/2019  ? Dr. Magnus IvanBlackman hip  ? KNEE ARTHROSCOPY Left   ? 06/07/2017 Dr. August [facility]rean of [facility]  ? KNEE ARTHROSCOPY Left 03/21/2020  ? Procedure: LEFT KNEE ARTHROSCOPY WITH PARTIAL MEDIAL MENISCECTOMY;  Surgeon: Kathryne HitchBlackman, Christopher Y, MD;  Location: [facility];  Service: Orthopedics;  Laterality: Left;  ? KNEE CLOSED REDUCTION Right 12/06/2015  ? Procedure: CLOSED MANIPULATION RIGHT KNEE;  Surgeon: Kerrin ChampagneJames E Nitka, MD;  Location: [facility];  Service: Orthopedics;  Laterality: Right;  ? KNEE CLOSED REDUCTION Right 01/17/2016  ? Procedure: CLOSED MANIPULATION RIGHT KNEE;  Surgeon: Kerrin ChampagneJames E Nitka, MD;  Location: [facility];  Service: Orthopedics;  Laterality: Right;  ? KNEE JOINT MANIPULATION Right 12/06/2015  ? LACRIMAL TUBE INSERTION Bilateral 03/01/2019  ? Procedure: LACRIMAL TUBE INSERTION;  Surgeon: Aura CampsSpencer, Michael, MD;  [facility];  Service: Ophthalmology;  Laterality: Bilateral;  ? LACRIMAL TUBE REMOVAL Bilateral 05/03/2019  ? Procedure: BILATERAL NASOLACRIMAL DUCT PROBING, IIRIGATION AND TUBE REMOVAL BOTH  EYES;  Surgeon: Aura CampsSpencer, Michael, MD;  Location: [facility];  Service: Ophthalmology;  Laterality: Bilateral;  ? LUMBAR LAMINECTOMY/DECOMPRESSION MICRODISCECTOMY N/A 06/03/2015  ? Procedure: Bilateral lateral recess decompression L2-3, L3-4, L4-5;  Surgeon: Kerrin ChampagneJames E Nitka, MD;  Location: [facility];  Service: Orthopedics;  Laterality: N/A;  ? LUMBAR LAMINECTOMY/DECOMPRESSION MICRODISCECTOMY N/A 10/02/2016  ? Procedure: Right L5-S1 Lateral Recess Decompression  microdiscectomy;  Surgeon: Kerrin ChampagneNitka, Cassius Cullinane E, MD;  [facility];  Service: Orthopedics;  Laterality: N/A;  ? LUMBAR LAMINECTOMY/DECOMPRESSION MICRODISCECTOMY N/A 07/15/2018  ? Procedure: LEFT L3-4 MICRODISCECTOMY;  Surgeon: Kerrin ChampagneNitka, Hue Steveson E, MD;  [facility];  Service: Orthopedics;  Laterality: N/A;  ? svd     ? x 2  ? TEAR DUCT  PROBING Bilateral 03/01/2019  ? Procedure: TEAR DUCT PROBING WITH IRRIGATION;  Surgeon: Aura Camps, MD;  [facility];  Service: Ophthalmology;  Laterality: Bilateral;  ? TOTAL HIP ARTHROPLASTY Left 03/14/2019  ? Procedure: LEFT TOTAL HIP ARTHROPLASTY ANTERIOR APPROACH;  Surgeon: [name], MD;  [facility];  Service: Orthopedics;  Laterality: Left;  ? TOTAL KNEE ARTHROPLASTY Right 09/06/2015  ? Procedure: RIGHT TOTAL KNEE ARTHROPLASTY;  Surgeon: Kerrin Champagne, MD;  Location: [facility];  Service: Orthopedics;  Laterality: Right;  ? TUBAL LIGATION    ? UPPER GASTROINTESTINAL ENDOSCOPY  04/28/2011  ? ?Social History  ? ?Occupational History  ? Occupation: unemployed  ?  Employer: UNEMPLOYED  ?Tobacco Use  ? Smoking status: Some Days  ?  Packs/day: 0.25  ?  Years: 32.00  ?  Pack years: 8.00  ?  Types: Cigarettes  ? Smokeless tobacco: Never  ? Tobacco comments:  ?  About 1-2 cigarettes a day  ?Vaping Use  ? Vaping Use: Never used  ?Substance and Sexual Activity  ? Alcohol use: No  ? Drug use: No  ? Sexual activity: Yes  ?  Birth control/protection: Surgical, Post-menopausal  ?   Comment: tubal ligation  ? ? ?Exam ?Pleasant female alert and oriented in no acute distress.  Wound looks good.  Staples removed and Steri-Strips applied.  No drainage or signs of infection. ?

## 2021-04-23 DIAGNOSIS — F331 Major depressive disorder, recurrent, moderate: Secondary | ICD-10-CM | POA: Diagnosis not present

## 2021-04-24 ENCOUNTER — Telehealth: Payer: Self-pay | Admitting: Specialist

## 2021-04-24 NOTE — Telephone Encounter (Signed)
Pt called requesting a cal back from [facility]. Pt asking when can she have another cortisone injection. Please call pt at [phone]. ?

## 2021-04-24 NOTE — Telephone Encounter (Signed)
Patient was calling and asking when she could take a shower. I advised that since her staples were out that she could take a shower but no tub baths. She states that she understands ?

## 2021-04-28 ENCOUNTER — Other Ambulatory Visit: Payer: Self-pay | Admitting: Internal Medicine

## 2021-04-28 NOTE — Telephone Encounter (Signed)
Medication Refill - Medication:  ?ferrous gluconate (FERGON) 324 MG tablet  ? ?Has the patient contacted their pharmacy? Yes.   ?Pharmacy called on behalf of pt ? ?Preferred Pharmacy (with phone number or street name):  ?[facility] - [place], [place] - [address]  ?[address] [place], [place] [address]  ?Phone:  [phone]  Fax:  [phone]  ? ?Has the patient been seen for an appointment in the last year OR does the patient have an upcoming appointment? Yes.   ? ?Agent: Please be advised that RX refills may take up to 3 business days. We ask that you follow-up with your pharmacy. ?

## 2021-04-29 ENCOUNTER — Other Ambulatory Visit: Payer: Self-pay | Admitting: Specialist

## 2021-04-29 ENCOUNTER — Other Ambulatory Visit: Payer: Self-pay | Admitting: Internal Medicine

## 2021-04-29 DIAGNOSIS — I739 Peripheral vascular disease, unspecified: Secondary | ICD-10-CM

## 2021-04-29 NOTE — Telephone Encounter (Signed)
[facility] called in to inform Dr [name] that patient is needing the clopidogrel (PLAVIX) 75 MG tablet today since patient is completely out  ?

## 2021-04-29 NOTE — Telephone Encounter (Signed)
Requested Prescriptions  ?Pending Prescriptions Disp Refills  ?? clopidogrel (PLAVIX) 75 MG tablet [Pharmacy Med Name: CLOPIDOGREL BISULFATE 75 MG ORAL TABLET] 30 tablet 0  ?  Sig: TAKE 1 TABLET (75 MG TOTAL) BY MOUTH DAILY. (AM)  ?  ? Hematology: Antiplatelets - clopidogrel Failed - 04/29/2021  3:35 PM  ?  ?  Failed - HCT in normal range and within 180 days  ?  HCT  ?Date Value Ref Range Status  ?03/28/2021 25.6 (L) 36.0 - 46.0 % Final  ? ?Hematocrit  ?Date Value Ref Range Status  ?01/20/2021 32.6 (L) 34.0 - 46.6 % Final  ?   ?  ?  Failed - HGB in normal range and within 180 days  ?  Hemoglobin  ?Date Value Ref Range Status  ?03/28/2021 8.5 (L) 12.0 - 15.0 g/dL Final  ?70/35/0093 81.8 (L) 11.1 - 15.9 g/dL Final  ?   ?  ?  Failed - Cr in normal range and within 360 days  ?  Creat  ?Date Value Ref Range Status  ?03/11/2016 0.90 0.50 - 1.05 mg/dL Final  ?  Comment:  ?    ?For patients > or = 62 years of age: The upper reference limit for ?Creatinine is approximately 13% higher for people identified as ?African-American. ?  ?  ? ?Creatinine, Ser  ?Date Value Ref Range Status  ?03/26/2021 1.06 (H) 0.44 - 1.00 mg/dL Final  ? ?Creatinine, POC  ?Date Value Ref Range Status  ?06/10/2016 200 mg/dL Final  ?   ?  ?  Passed - PLT in normal range and within 180 days  ?  Platelets  ?Date Value Ref Range Status  ?03/28/2021 214 150 - 400 K/uL Final  ?01/20/2021 393 150 - 450 x10E3/uL Final  ?   ?  ?  Passed - Valid encounter within last 6 months  ?  Recent Outpatient Visits   ?      ? 3 weeks ago Generalized pruritus  ? [facility] [place], [place] W, NP  ? 3 months ago Preoperative evaluation to rule out surgical contraindication  ? [facility] Jonah Blue B, MD  ? 3 months ago Pap smear for cervical cancer screening  ? [facility] Marcine Matar, MD  ? 8 months ago Fingernail injury, left, initial encounter  ? [facility], Cari S, PA-C  ? 10 months ago Type 2 diabetes mellitus with diabetic polyneuropathy, without long-term current use of insulin (HCC)  ? [facility] Marcine Matar, MD  ?  ?  ?Future Appointments   ?        ? In 2 days Marcine Matar, MD [facility]  ? In 2 weeks Kerrin Champagne, MD [facility]  ?  ? ?  ?  ?  ? ? ?

## 2021-04-30 DIAGNOSIS — F331 Major depressive disorder, recurrent, moderate: Secondary | ICD-10-CM | POA: Diagnosis not present

## 2021-04-30 NOTE — Telephone Encounter (Signed)
Requested medication (s) are due for refill today: yes ? ?Requested medication (s) are on the active medication list: yes ? ?Last refill:  03/28/21 #60/1 ? ?Future visit scheduled: yes ? ?Notes to clinic:  Unable to refill per protocol due to failed labs, no updated results. ? ? ? ?  ?Requested Prescriptions  ?Pending Prescriptions Disp Refills  ? ferrous gluconate (FERGON) 324 MG tablet 60 tablet 1  ?  Sig: Take 1 tablet (324 mg total) by mouth 2 (two) times daily with a meal.  ?  ? Endocrinology:  Minerals - Iron Supplementation Failed - 04/28/2021 11:11 PM  ?  ?  Failed - HGB in normal range and within 360 days  ?  Hemoglobin  ?Date Value Ref Range Status  ?03/28/2021 8.5 (L) 12.0 - 15.0 g/dL Final  ?01/20/2021 10.1 (L) 11.1 - 15.9 g/dL Final  ?  ?  ?  ?  Failed - HCT in normal range and within 360 days  ?  HCT  ?Date Value Ref Range Status  ?03/28/2021 25.6 (L) 36.0 - 46.0 % Final  ? ?Hematocrit  ?Date Value Ref Range Status  ?01/20/2021 32.6 (L) 34.0 - 46.6 % Final  ?  ?  ?  ?  Failed - RBC in normal range and within 360 days  ?  RBC  ?Date Value Ref Range Status  ?03/28/2021 3.25 (L) 3.87 - 5.11 MIL/uL Final  ?  ?  ?  ?  Failed - Fe (serum) in normal range and within 360 days  ?  Iron  ?Date Value Ref Range Status  ?10/14/2018 15 (L) 27 - 159 ug/dL Final  ? ?Iron Saturation  ?Date Value Ref Range Status  ?10/14/2018 3 (LL) 15 - 55 % Final  ?  ?  ?  ?  Failed - Ferritin in normal range and within 360 days  ?  Ferritin  ?Date Value Ref Range Status  ?10/14/2018 19 15 - 150 ng/mL Final  ?  ?  ?  ?  Passed - Valid encounter within last 12 months  ?  Recent Outpatient Visits   ? ?      ? 3 weeks ago Generalized pruritus  ? [place], [place] W, NP  ? 3 months ago Preoperative evaluation to rule out surgical contraindication  ? [place] Karle Plumber B, MD  ? 3 months ago Pap smear for cervical cancer screening  ? [place] [name], MD  ? 8 months ago Fingernail injury, left, initial encounter  ? [facility] at [place], PA-C  ? 10 months ago Type 2 diabetes mellitus with diabetic polyneuropathy, without long-term current use of insulin ([place])  ? [place] [name], MD  ? ?  ?  ?Future Appointments   ? ?        ? Tomorrow [name], MD [place]  ? In 2 weeks [name], MD [place]  ? ?  ? ?  ?  ?  ? ?

## 2021-05-01 ENCOUNTER — Encounter: Payer: Self-pay | Admitting: Internal Medicine

## 2021-05-01 ENCOUNTER — Ambulatory Visit: Payer: Medicaid Other | Attending: Internal Medicine | Admitting: Internal Medicine

## 2021-05-01 VITALS — BP 129/85 | HR 115 | Resp 16 | Wt 154.0 lb

## 2021-05-01 DIAGNOSIS — D509 Iron deficiency anemia, unspecified: Secondary | ICD-10-CM

## 2021-05-01 DIAGNOSIS — Z09 Encounter for follow-up examination after completed treatment for conditions other than malignant neoplasm: Secondary | ICD-10-CM | POA: Diagnosis not present

## 2021-05-01 DIAGNOSIS — Z9989 Dependence on other enabling machines and devices: Secondary | ICD-10-CM | POA: Diagnosis not present

## 2021-05-01 DIAGNOSIS — G4733 Obstructive sleep apnea (adult) (pediatric): Secondary | ICD-10-CM

## 2021-05-01 DIAGNOSIS — J432 Centrilobular emphysema: Secondary | ICD-10-CM

## 2021-05-01 DIAGNOSIS — I10 Essential (primary) hypertension: Secondary | ICD-10-CM | POA: Diagnosis not present

## 2021-05-01 DIAGNOSIS — E1142 Type 2 diabetes mellitus with diabetic polyneuropathy: Secondary | ICD-10-CM

## 2021-05-01 NOTE — Progress Notes (Signed)
? ? ?Patient ID: Stacy [name], female    DOB: November 11, 1960  MRN: [id number] ? ?CC: Hospitalization Follow-up (Post surgical follow up ) ? ? ?Subjective: ?[name] is a 61 y.o. female who presents for hosp f/u ?Her concerns today include:  ?Pt with hx of HTN, DM with neuropathy, aortic atherosclerosis, tob dep, HL, PAD, IDA, lupus, spinal stenosis with neurogenic claudication (s/p laminectomy 07/2018), COPD,OSA on CPAP,LS, OA knees, gout, recurrent sinusitis, receiving allergy shots from Dr. Faith Rogue. ? ?Neighbor, Cleophas Dunker, is with her. ? ?Patient hospitalized 2/21-24/2023 for surgery on the lumbar spine.  She underwent fusion of the lumbar spine by Dr. [name].  She did well postoperatively.  Patient thinks she was transfused 1 or 2 units of blood however I do not see this mentioned in her discharge summary.  H/H at [place] was [phone].  She was seen by Dr. Nils Pyle PA posthospital on 04/17/2021.  She was advised to continue wearing her back brace and refill given on oxycodone and OxyContin. ? ?Today: ?She brings med box/blister pack with her. ? ?She presents using an upright walker.  Reports that her neighbor who is with her assist her in carrying out her ADLs.  She will see Dr. [name] the middle of next month and thinks she will start physical therapy at that time. ? ? ?DM: ?Lab Results  ?Component Value Date  ? HGBA1C 5.8 (H) 03/25/2021  ?Checking BS before BF.  Range BS 112-115. No low BS ?Compliant with meds that include Amaryl 2 mg daily, metformin XR 750 mg twice a day ? ?HTN:  checks BP regularly. Once a day.  She reports that her numbers have been good.  Reports compliance with taking medication Diovan/HCTZ ? ?COPD:  doing well with Symbicort, Singulair and Spiriva ?Dr. Remus Blake on 4/18 ?Needs hose and mask for  ? ?OSA:  using CPAP.  Waiting on replacement hose and new mask from supplieer.  Called supplier and was told they were waiting for Medicaid to approve - [place] ?Patient Active  Problem List  ? Diagnosis Date Noted  ? Fusion of spine of lumbar region 03/25/2021  ? Colon polyps 06/06/2020  ? Lupus ([place]) 06/06/2020  ? Allergic rhinitis due to animal (cat) (dog) hair and dander 04/08/2020  ? Allergic rhinitis due to pollen 04/08/2020  ? Food allergy 04/08/2020  ? Acute medial meniscus tear, left, subsequent encounter 03/21/2020  ? Chronic pain of left knee 02/15/2020  ? Paresthesia of skin 11/16/2019  ? History of total knee replacement, right 11/16/2019  ? Tobacco abuse 11/16/2019  ? Centrilobular emphysema ([place]) 08/10/2019  ? Incidental lung nodule, > 25m and < 841m07/09/2019  ? OSA on CPAP 08/10/2019  ? Dyspnea on exertion 08/02/2019  ? Hyperlipidemia 08/02/2019  ? Lumbar radiculopathy 04/24/2019  ? 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  ? Post laminectomy syndrome 02/23/2019  ? Abnormality of gait 02/23/2019  ? 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  ? Unilateral primary osteoarthritis, left hip 12/28/2018  ? Lesion of skin of left ear 12/26/2018  ? Primary osteoarthritis of left hip 12/02/2018  ? Iron deficiency anemia 10/16/2018  ? Chronic pain syndrome 09/08/2018  ? Chronic pain of right knee 08/11/2018  ? Status post lumbar laminectomy 07/15/2018  ? Peripheral arterial disease ([place]/04/2018  ? Moderate persistent asthma without complication 0540/98/1191? Environmental and seasonal allergies 06/29/2017  ? Controlled type 2 diabetes mellitus with diabetic polyneuropathy, without long-term current use of insulin ([place]/28/2019  ? Perennial  allergic rhinitis 04/08/2017  ? Sensorineural hearing loss (SNHL), bilateral 04/08/2017  ? Chronic pansinusitis 03/25/2017  ? Eustachian tube dysfunction, bilateral 03/25/2017  ? Lichen planopilaris 51/88/4166  ? Herniation of lumbar intervertebral disc with radiculopathy 10/02/2016  ?  Class: Chronic  ? Alopecia areata 08/19/2016  ? Chondromalacia of both patellae 06/03/2015  ?  Class: Chronic  ? Spinal stenosis, lumbar region, with neurogenic  claudication 06/03/2015  ? Tobacco use disorder 04/25/2015  ? DJD (degenerative joint disease) of knee 01/04/2015  ? Hemorrhoid 11/14/2014  ? Gout of big toe 07/19/2014  ? Essential hypertension 08/14/2013  ? Gastroesophageal reflux disease without esophagitis 08/14/2013  ? COPD (chronic obstructive pulmonary disease) ([place]) 04/17/2011  ?  ? ?Current Outpatient Medications on File Prior to Visit  ?Medication Sig Dispense Refill  ? Accu-Chek Softclix Lancets lancets USE AS INSTRUCTED 3 TIMES A DAY 100 each 12  ? albuterol (PROVENTIL) (2.5 MG/3ML) 0.083% nebulizer solution USE ONE VIAL (2.5 MG TOTAL) BY NEBULIZATION EVERY 6 (SIX) HOURS AS NEEDED FOR WHEEZING. 360 mL 0  ? allopurinol (ZYLOPRIM) 100 MG tablet TAKE 1 TABLET (100 MG TOTAL) BY MOUTH DAILY.(AM) 90 tablet 3  ? atorvastatin (LIPITOR) 40 MG tablet Take 1 tablet (40 mg total) by mouth daily. 30 tablet 4  ? Blood Glucose Monitoring Suppl (ACCU-CHEK AVIVA PLUS) w/Device KIT 1 each by Does not apply route 3 (three) times daily. 1 kit 0  ? budesonide-formoterol (SYMBICORT) 80-4.5 MCG/ACT inhaler INHALE TWO PUFFS BY MOUTH TWICE A DAY 1 Inhaler 11  ? celecoxib (CELEBREX) 200 MG capsule TAKE 1 CAPSULE (200 MG TOTAL) BY MOUTH 2 (TWO) TIMES DAILY. (AM+BEDTIME) 180 capsule 3  ? clopidogrel (PLAVIX) 75 MG tablet TAKE 1 TABLET (75 MG TOTAL) BY MOUTH DAILY. (AM) 30 tablet 2  ? diclofenac Sodium (VOLTAREN) 1 % GEL APPLY 4 GRAMS TOPICALLY 4 (FOUR) TIMES DAILY. 400 g 6  ? dicyclomine (BENTYL) 10 MG capsule Take 10 mg by mouth in the morning, at noon, and at bedtime.    ? docusate sodium (COLACE) 100 MG capsule Take 1 capsule (100 mg total) by mouth 2 (two) times daily. 40 capsule 0  ? DULoxetine (CYMBALTA) 60 MG capsule TAKE 1 CAPSULE (60 MG TOTAL) BY MOUTH DAILY (AM) 30 capsule 3  ? EPINEPHrine 0.3 mg/0.3 mL IJ SOAJ injection 0.3 mg IM x 1 PRN for allergic reaction (Patient taking differently: Inject 0.3 mg into the muscle once as needed for anaphylaxis.) 1 Device 1  ? [name]  325 (65 Fe) MG tablet TAKE ONE TABLET ( 325 MG ) BY MOUTH DAILY (AM) (Patient not taking: Reported on 05/01/2021) 100 tablet 0  ? ferrous gluconate (FERGON) 324 MG tablet Take 1 tablet (324 mg total) by mouth 2 (two) times daily with a meal. 60 tablet 1  ? FLOWFLEX COVID-19 AG HOME TEST KIT See admin instructions.    ? fluticasone (FLONASE) 50 MCG/ACT nasal spray Place 1 spray into both nostrils daily. 16 g 6  ? gabapentin (NEURONTIN) 600 MG tablet TAKE ONE CAPSULE BY MOUTH THREE TIMES A DAY (AM+NOON+BEDTIME) 90 tablet 1  ? glimepiride (AMARYL) 2 MG tablet TAKE 1 TABLET (2 MG TOTAL) BY MOUTH DAILY. (AM) 90 tablet 1  ? glucose blood (ACCU-CHEK AVIVA PLUS) test strip Use as instructed 100 each 12  ? hydrOXYzine (ATARAX) 10 MG tablet TAKE 1 TABLET IN THE MORNING AND NOON AND 2 TABLETS IN THE EVENING AS NEEDED. 120 tablet 2  ? Lancets (ACCU-CHEK SOFT TOUCH) lancets Use as instructed  100 each 12  ? loperamide (IMODIUM) 2 MG capsule TAKE 1 CAPSULE (2 MG TOTAL) BY MOUTH 4 (FOUR) TIMES DAILY AS NEEDED FOR DIARRHEA OR LOOSE STOOLS. 15 capsule 0  ? loratadine (CLARITIN) 10 MG tablet Take 10 mg by mouth daily.    ? metFORMIN (GLUCOPHAGE-XR) 750 MG 24 hr tablet TAKE 1 TABLET (750 MG TOTAL) BY MOUTH 2 (TWO) TIMES DAILY (AM+BEDTIME) (Patient not taking: Reported on 05/01/2021) 60 tablet 1  ? methocarbamol (ROBAXIN) 500 MG tablet Take 1 tablet (500 mg total) by mouth every 8 (eight) hours as needed for muscle spasms. (Patient not taking: Reported on 05/01/2021) 30 tablet 1  ? methocarbamol (ROBAXIN) 750 MG tablet TAKE 1 TABLET (750 MG TOTAL) BY MOUTH EVERY 12 (TWELVE) HOURS AS NEEDED FOR MUSCLE SPASMS. 40 tablet 1  ? metoCLOPramide (REGLAN) 5 MG tablet Take 5 mg by mouth 3 (three) times daily as needed for nausea or vomiting. Take 1 tablet by mouth three times a day before meals as needed for nausea and vomiting.    ? montelukast (SINGULAIR) 10 MG tablet Take 10 mg by mouth at bedtime.     ? Multiple Vitamins-Minerals (CENTRUM SILVER  50+WOMEN PO) Take 1 tablet by mouth daily.    ? nicotine (NICODERM CQ - DOSED IN MG/24 HOURS) 14 mg/24hr patch PLACE 1 PATCH ONTO THE SKIN DAILY 28 patch 1  ? ofloxacin (OCUFLOX) 0.3 % ophthalmic soluti

## 2021-05-02 LAB — CBC
Hematocrit: 34.7 % (ref 34.0–46.6)
Hemoglobin: 11.3 g/dL (ref 11.1–15.9)
MCH: 26.3 pg — ABNORMAL LOW (ref 26.6–33.0)
MCHC: 32.6 g/dL (ref 31.5–35.7)
MCV: 81 fL (ref 79–97)
Platelets: 297 10*3/uL (ref 150–450)
RBC: 4.3 x10E6/uL (ref 3.77–5.28)
RDW: 15.7 % — ABNORMAL HIGH (ref 11.7–15.4)
WBC: 7.1 10*3/uL (ref 3.4–10.8)

## 2021-05-07 DIAGNOSIS — F331 Major depressive disorder, recurrent, moderate: Secondary | ICD-10-CM | POA: Diagnosis not present

## 2021-05-14 ENCOUNTER — Ambulatory Visit (INDEPENDENT_AMBULATORY_CARE_PROVIDER_SITE_OTHER): Payer: Medicaid Other | Admitting: Podiatry

## 2021-05-14 ENCOUNTER — Encounter: Payer: Self-pay | Admitting: Podiatry

## 2021-05-14 DIAGNOSIS — B351 Tinea unguium: Secondary | ICD-10-CM

## 2021-05-14 DIAGNOSIS — M79676 Pain in unspecified toe(s): Secondary | ICD-10-CM | POA: Diagnosis not present

## 2021-05-14 DIAGNOSIS — F331 Major depressive disorder, recurrent, moderate: Secondary | ICD-10-CM | POA: Diagnosis not present

## 2021-05-14 DIAGNOSIS — E1151 Type 2 diabetes mellitus with diabetic peripheral angiopathy without gangrene: Secondary | ICD-10-CM

## 2021-05-15 ENCOUNTER — Ambulatory Visit (INDEPENDENT_AMBULATORY_CARE_PROVIDER_SITE_OTHER): Payer: Medicaid Other | Admitting: Specialist

## 2021-05-15 ENCOUNTER — Encounter: Payer: Self-pay | Admitting: Specialist

## 2021-05-15 VITALS — BP 111/71 | HR 92 | Temp 98.1°F | Ht 66.0 in | Wt 154.0 lb

## 2021-05-15 DIAGNOSIS — Z981 Arthrodesis status: Secondary | ICD-10-CM

## 2021-05-15 MED ORDER — TRAMADOL HCL 50 MG PO TABS: 100.0000 mg | ORAL_TABLET | Freq: Four times a day (QID) | ORAL | 0 refills | Status: DC | PRN

## 2021-05-15 MED ORDER — TIZANIDINE HCL 2 MG PO CAPS: 2.0000 mg | ORAL_CAPSULE | Freq: Three times a day (TID) | ORAL | 2 refills | Status: DC

## 2021-05-15 NOTE — Progress Notes (Signed)
? ?Post-Op Visit Note ?  ?Patient: Stacy [name]           ?Date of Birth: 01-22-1961           ?MRN: [id number] ?Visit Date: 05/15/2021 ?PCP: [name], MD ? ? ?Assessment & Plan: 7 weeks post op 3 level lumbar fusion L3-4,L4-5 and L5-S1. ? ?Chief Complaint:  ?Chief Complaint  ?Patient presents with  ? Lower Back - Routine Post Op  ?Complains of pains intermittantly in the back comes and goes, no narcotics left. ?Has muscle spasm occasionally in the back ?Motor is normal  ?SLR in negative bilaterally ?No radiographs today ?Visit Diagnoses: No diagnosis found. ? ?Plan: Avoid frequent bending and stooping  ?No lifting greater than 10 lbs. ?May use ice or moist heat for pain. ?Weight loss is of benefit. ?Exercise is important to improve your indurance and does allow people to function better inspite of back pain. ?Will provide tramadol and a muscle relaxer for discomfort. ?Return in 6 weeks.  ?  ? ?Follow-Up Instructions: No follow-ups on file.  ? ?Orders:  ?No orders of the defined types were placed in this encounter. ? ?No orders of the defined types were placed in this encounter. ? ? ?Imaging: ?No results found. ? ?PMFS History: ?Patient Active Problem List  ? Diagnosis Date Noted  ? Herniation of lumbar intervertebral disc with radiculopathy 10/02/2016  ?  Priority: High  ?  Class: Chronic  ? Chondromalacia of both patellae 06/03/2015  ?  Priority: High  ?  Class: Chronic  ? Fusion of spine of lumbar region 03/25/2021  ? Colon polyps 06/06/2020  ? Lupus ([place]) 06/06/2020  ? Allergic rhinitis due to animal (cat) (dog) hair and dander 04/08/2020  ? Allergic rhinitis due to pollen 04/08/2020  ? Food allergy 04/08/2020  ? Acute medial meniscus tear, left, subsequent encounter 03/21/2020  ? Chronic pain of left knee 02/15/2020  ? Paresthesia of skin 11/16/2019  ? History of total knee replacement, right 11/16/2019  ? Tobacco abuse 11/16/2019  ? Centrilobular emphysema ([place]) 08/10/2019  ? Incidental lung  nodule, > 78mm and < 66mm 08/10/2019  ? OSA on CPAP 08/10/2019  ? Dyspnea on exertion 08/02/2019  ? Hyperlipidemia 08/02/2019  ? Lumbar radiculopathy 04/24/2019  ? 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  ? Post laminectomy syndrome 02/23/2019  ? Abnormality of gait 02/23/2019  ? 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  ? Unilateral primary osteoarthritis, left hip 12/28/2018  ? Lesion of skin of left ear 12/26/2018  ? Primary osteoarthritis of left hip 12/02/2018  ? Iron deficiency anemia 10/16/2018  ? Chronic pain syndrome 09/08/2018  ? Chronic pain of right knee 08/11/2018  ? Status post lumbar laminectomy 07/15/2018  ? Peripheral arterial disease ([place]) 04/05/2018  ? Moderate persistent asthma without complication [id number]  ? Environmental and seasonal allergies 06/29/2017  ? Controlled type 2 diabetes mellitus with diabetic polyneuropathy, without long-term current use of insulin ([place]) 06/29/2017  ? Perennial allergic rhinitis 04/08/2017  ? Sensorineural hearing loss (SNHL), bilateral 04/08/2017  ? Chronic pansinusitis 03/25/2017  ? Eustachian tube dysfunction, bilateral 03/25/2017  ? Lichen planopilaris [id number]  ? Alopecia areata 08/19/2016  ? Spinal stenosis, lumbar region, with neurogenic claudication 06/03/2015  ? Tobacco use disorder 04/25/2015  ? DJD (degenerative joint disease) of knee 01/04/2015  ? Hemorrhoid 11/14/2014  ? Gout of big toe 07/19/2014  ? Essential hypertension 08/14/2013  ? Gastroesophageal reflux disease without esophagitis 08/14/2013  ? COPD (chronic obstructive pulmonary disease) ([place])  04/17/2011  ? ?Past Medical History:  ?Diagnosis Date  ? Allergy   ? Shellfish, cleaning products  ? Anxiety   ? Arthritis   ? Arthrofibrosis of total knee replacement ([place])   ? right  ? Asthma   ? COPD (chronic obstructive pulmonary disease) ([place])   ? Depression   ? Diabetes mellitus   ? Type II  ? GERD (gastroesophageal reflux disease)   ? Pt on Protonix daily  ? Glaucoma   ? Gout   ? Headache(784.0)    ? otc meds prn  ? Hyperlipidemia   ? Hypertension   ? Irritable bowel syndrome 11/19/2010  ? Neuropathy   ? Pneumonia YRS AGO  ? Restless legs   ? Shortness of breath   ? 07/14/2018- uses  4 times a day  ? Sleep apnea   ?  ?Family History  ?Problem Relation Age of Onset  ? Hypertension Father   ? Cancer Father   ? Heart disease Mother   ? Asthma Son   ?     had as a child  ? Heart disease Sister   ? Breast cancer Sister   ? Hypertension Brother   ?  ?Past Surgical History:  ?Procedure Laterality Date  ? CHOLECYSTECTOMY    ? COLONOSCOPY    ? ENDOMETRIAL ABLATION  10/2010  ? HERNIA REPAIR    ? umbicial hernia  ? JOINT REPLACEMENT Left 03/14/2019  ? Dr. Ninfa Linden hip  ? KNEE ARTHROSCOPY Left   ? 06/07/2017 Dr. Marlou Sa of [place]  ? KNEE ARTHROSCOPY Left 03/21/2020  ? Procedure: LEFT KNEE ARTHROSCOPY WITH PARTIAL MEDIAL MENISCECTOMY;  Surgeon: Mcarthur Rossetti, MD;  Location: [facility];  Service: Orthopedics;  Laterality: Left;  ? KNEE CLOSED REDUCTION Right 12/06/2015  ? Procedure: CLOSED MANIPULATION RIGHT KNEE;  Surgeon: [name], MD;  Location: [facility];  Service: Orthopedics;  Laterality: Right;  ? KNEE CLOSED REDUCTION Right 01/17/2016  ? Procedure: CLOSED MANIPULATION RIGHT KNEE;  Surgeon: [name], MD;  Location: [facility];  Service: Orthopedics;  Laterality: Right;  ? KNEE JOINT MANIPULATION Right 12/06/2015  ? LACRIMAL TUBE INSERTION Bilateral 03/01/2019  ? Procedure: LACRIMAL TUBE INSERTION;  Surgeon: Gevena Cotton, MD;  Location: [facility];  Service: Ophthalmology;  Laterality: Bilateral;  ? LACRIMAL TUBE REMOVAL Bilateral 05/03/2019  ? Procedure: BILATERAL NASOLACRIMAL DUCT PROBING, IIRIGATION AND TUBE REMOVAL BOTH EYES;  Surgeon: Gevena Cotton, MD;  Location: [facility];  Service: Ophthalmology;  Laterality: Bilateral;  ? LUMBAR LAMINECTOMY/DECOMPRESSION MICRODISCECTOMY N/A 06/03/2015  ? Procedure: Bilateral lateral recess decompression L2-3,  L3-4, L4-5;  Surgeon: [name], MD;  Location: [facility];  Service: Orthopedics;  Laterality: N/A;  ? LUMBAR LAMINECTOMY/DECOMPRESSION MICRODISCECTOMY N/A 10/02/2016  ? Procedure: Right L5-S1 Lateral Recess Decompression  microdiscectomy;  Surgeon: [name], MD;  Location: [place];  Service: Orthopedics;  Laterality: N/A;  ? LUMBAR LAMINECTOMY/DECOMPRESSION MICRODISCECTOMY N/A 07/15/2018  ? Procedure: LEFT L3-4 MICRODISCECTOMY;  Surgeon: [name], MD;  Location: [place];  Service: Orthopedics;  Laterality: N/A;  ? svd     ? x 2  ? TEAR DUCT PROBING Bilateral 03/01/2019  ? Procedure: TEAR DUCT PROBING WITH IRRIGATION;  Surgeon: Gevena Cotton, MD;  Location: [facility];  Service: Ophthalmology;  Laterality: Bilateral;  ? TOTAL HIP ARTHROPLASTY Left 03/14/2019  ? Procedure: LEFT TOTAL HIP ARTHROPLASTY ANTERIOR APPROACH;  Surgeon: Mcarthur Rossetti, MD;  Location: [facility];  Service: Orthopedics;  Laterality:  Left;  ? TOTAL KNEE ARTHROPLASTY Right 09/06/2015  ? Procedure: RIGHT TOTAL KNEE ARTHROPLASTY;  Surgeon: [name], MD;  Location: [facility];  Service: Orthopedics;  Laterality: Right;  ? TUBAL LIGATION    ? UPPER GASTROINTESTINAL ENDOSCOPY  04/28/2011  ? ?Social History  ? ?Occupational History  ? Occupation: unemployed  ?  Employer: UNEMPLOYED  ?Tobacco Use  ? Smoking status: Some Days  ?  Packs/day: 0.25  ?  Years: 32.00  ?  Pack years: 8.00  ?  Types: Cigarettes  ? Smokeless tobacco: Never  ? Tobacco comments:  ?  About 1-2 cigarettes a day  ?Vaping Use  ? Vaping Use: Never used  ?Substance and Sexual Activity  ? Alcohol use: No  ? Drug use: No  ? Sexual activity: Yes  ?  Birth control/protection: Surgical, Post-menopausal  ?  Comment: tubal ligation  ? ? ? ?

## 2021-05-20 DIAGNOSIS — J449 Chronic obstructive pulmonary disease, unspecified: Secondary | ICD-10-CM | POA: Diagnosis not present

## 2021-05-20 DIAGNOSIS — J3089 Other allergic rhinitis: Secondary | ICD-10-CM | POA: Diagnosis not present

## 2021-05-20 DIAGNOSIS — J301 Allergic rhinitis due to pollen: Secondary | ICD-10-CM | POA: Diagnosis not present

## 2021-05-20 DIAGNOSIS — J3081 Allergic rhinitis due to animal (cat) (dog) hair and dander: Secondary | ICD-10-CM | POA: Diagnosis not present

## 2021-05-21 DIAGNOSIS — F331 Major depressive disorder, recurrent, moderate: Secondary | ICD-10-CM | POA: Diagnosis not present

## 2021-05-21 DIAGNOSIS — G4733 Obstructive sleep apnea (adult) (pediatric): Secondary | ICD-10-CM | POA: Diagnosis not present

## 2021-05-21 DIAGNOSIS — R197 Diarrhea, unspecified: Secondary | ICD-10-CM | POA: Diagnosis not present

## 2021-05-21 DIAGNOSIS — K589 Irritable bowel syndrome without diarrhea: Secondary | ICD-10-CM | POA: Diagnosis not present

## 2021-05-21 DIAGNOSIS — J449 Chronic obstructive pulmonary disease, unspecified: Secondary | ICD-10-CM | POA: Diagnosis not present

## 2021-05-23 NOTE — Progress Notes (Signed)
?  Subjective:  ?Patient ID: Stacy [name], female    DOB: 1960/06/01,  MRN: [id number] ? ?Stacy [name] presents to clinic today for at risk foot care. Pt has h/o NIDDM with PAD and painful elongated mycotic toenails 1-5 bilaterally which are tender when wearing enclosed shoe gear. Pain is relieved with periodic professional debridement. ? ?Last HgA1c was 5.8%. Patient did not check blood glucose today. ? ?New problem(s): None.  ? ?PCP is Marcine Matar, MD , and last visit was May 01, 2021. ? ?She is recovering from back surgery.  ? ?Allergies  ?Allergen Reactions  ? Other Shortness Of Breath  ?  UNSPECIFIED AGENTS ?Allergic to perfumes and cleaning products  ? Shellfish Allergy Anaphylaxis  ?  Per allergy test.  ? Ace Inhibitors Cough and Other (See Comments)  ?   ?  ? Celecoxib   ?   upset stomach  ?Pt is taking med   ? Aspirin Nausea Only  ?  stomach upset ?  ? ? ?Review of Systems: Negative except as noted in the HPI. ? ?Objective: No changes noted in today's physical examination. ?Constitutional Stacy [name] is a pleasant 61 y.o. African American female, WD, WN in NAD. AAO x 3.   ?Vascular Capillary refill time to digits <4 seconds b/l lower extremities. Faintly palpable pedal pulses b/l. Pedal hair absent. Lower extremity skin temperature gradient within normal limits. No pain with calf compression b/l. No edema noted b/l lower extremities. No cyanosis or clubbing noted.  ?Neurologic Normal speech. Oriented to person, place, and time. Pt has subjective symptoms of neuropathy. Protective sensation intact 5/5 intact bilaterally with 10g monofilament b/l.  ?Dermatologic Skin warm and supple b/l lower extremities. No open wounds b/l lower extremities. No interdigital macerations b/l lower extremities. Toenails 1-5 b/l elongated, discolored, dystrophic, thickened, crumbly with subungual debris and tenderness to dorsal palpation.  ?Orthopedic: Normal muscle strength 5/5 to all lower  extremity muscle groups bilaterally. Hallux valgus with bunion deformity noted b/l lower extremities.  ? ?Radiographs: None ? ?  Latest Ref Rng & Units 03/25/2021  ?  3:35 PM 03/20/2021  ?  8:43 AM 01/20/2021  ?  3:09 PM 06/06/2020  ?  9:04 AM  ?Hemoglobin A1C  ?Hemoglobin-A1c 4.8 - 5.6 % 5.8   5.7   6.3   7.4    ? ?Assessment/Plan: ?1. Pain due to onychomycosis of toenail   ?2. Type II diabetes mellitus with peripheral circulatory disorder [facility])   ?  ? ?-Patient was evaluated and treated. All patient's and/or POA's questions/concerns answered on today's visit. ?-Continue foot and shoe inspections daily. Monitor blood glucose per PCP/Endocrinologist's recommendations. ?-Toenails 1-5 b/l were debrided in length and girth with sterile nail nippers and dremel without iatrogenic bleeding.  ?-Patient/POA to call should there be question/concern in the interim.  ? ?Return in about 3 months (around 08/13/2021). ? ?[name], DPM  ?

## 2021-05-26 DIAGNOSIS — M4156 Other secondary scoliosis, lumbar region: Secondary | ICD-10-CM

## 2021-05-26 DIAGNOSIS — M4726 Other spondylosis with radiculopathy, lumbar region: Secondary | ICD-10-CM

## 2021-05-26 DIAGNOSIS — M4316 Spondylolisthesis, lumbar region: Secondary | ICD-10-CM

## 2021-05-28 DIAGNOSIS — F331 Major depressive disorder, recurrent, moderate: Secondary | ICD-10-CM | POA: Diagnosis not present

## 2021-06-02 ENCOUNTER — Other Ambulatory Visit: Payer: Self-pay | Admitting: Internal Medicine

## 2021-06-02 ENCOUNTER — Other Ambulatory Visit: Payer: Self-pay | Admitting: Specialist

## 2021-06-02 DIAGNOSIS — J449 Chronic obstructive pulmonary disease, unspecified: Secondary | ICD-10-CM

## 2021-06-02 DIAGNOSIS — E119 Type 2 diabetes mellitus without complications: Secondary | ICD-10-CM

## 2021-06-02 DIAGNOSIS — F17200 Nicotine dependence, unspecified, uncomplicated: Secondary | ICD-10-CM

## 2021-06-02 DIAGNOSIS — E1142 Type 2 diabetes mellitus with diabetic polyneuropathy: Secondary | ICD-10-CM

## 2021-06-04 DIAGNOSIS — F331 Major depressive disorder, recurrent, moderate: Secondary | ICD-10-CM | POA: Diagnosis not present

## 2021-06-09 ENCOUNTER — Ambulatory Visit (INDEPENDENT_AMBULATORY_CARE_PROVIDER_SITE_OTHER): Payer: Medicaid Other

## 2021-06-09 ENCOUNTER — Encounter: Payer: Self-pay | Admitting: Specialist

## 2021-06-09 ENCOUNTER — Ambulatory Visit (INDEPENDENT_AMBULATORY_CARE_PROVIDER_SITE_OTHER): Payer: Medicaid Other | Admitting: Specialist

## 2021-06-09 VITALS — BP 130/87 | HR 89 | Ht 66.0 in | Wt 154.0 lb

## 2021-06-09 DIAGNOSIS — Z981 Arthrodesis status: Secondary | ICD-10-CM

## 2021-06-09 MED ORDER — METHOCARBAMOL 750 MG PO TABS: 750.0000 mg | ORAL_TABLET | Freq: Two times a day (BID) | ORAL | 1 refills | Status: DC | PRN

## 2021-06-09 MED ORDER — TRAMADOL HCL 50 MG PO TABS: 100.0000 mg | ORAL_TABLET | Freq: Four times a day (QID) | ORAL | 0 refills | Status: DC | PRN

## 2021-06-09 NOTE — Progress Notes (Addendum)
? ?Post-Op Visit Note ?  ?Patient: Stacy [name]           ?Date of Birth: 03-27-1960           ?MRN: [id number] ?Visit Date: 06/09/2021 ?PCP: Marcine MatarJohnson, Deborah B, MD ? ? ?Assessment & Plan: 2.5 months post op 3 level TLIFs L2-3, L3-4 and L4-5 ?Awake and alert and oriented x 4. ?Motor is normal SLR is normal ?Incision with 3 subcutaneous prominent sutures not showing through skin. ?Radiographs show TLIFs L2-3, L3-4 and L4-5 in good position and alignment, healing evident. ?Chief Complaint:  ?Chief Complaint  ?Patient presents with  ? Lower Back - Routine Post Op  ? ?Visit Diagnoses:  ?1. S/P lumbar fusion   ? ? ?Plan: Avoid frequent bending and stooping  ?No lifting greater than 10 lbs. ?May use ice or moist heat for pain. ?Weight loss is of benefit. ?Best medication for lumbar disc disease is arthritis medications like motrin, celebrex and naprosyn. ?Exercise is important to improve your indurance and does allow people to function better inspite of back pain. ? Start weaning from your brace, may remove for 2 hours a day for one week then starting next week begin going half days without brace for 2 weeks then stop the use of the brace. ? ?Follow-Up Instructions: No follow-ups on file.  ? ?Orders:  ?Orders Placed This Encounter  ?Procedures  ? XR Lumbar Spine 2-3 Views  ? ?No orders of the defined types were placed in this encounter. ? ? ?Imaging: ?No results found. ? ?PMFS History: ?Patient Active Problem List  ? Diagnosis Date Noted  ? Herniation of lumbar intervertebral disc with radiculopathy 10/02/2016  ?  Priority: High  ?  Class: Chronic  ? Chondromalacia of both patellae 06/03/2015  ?  Priority: High  ?  Class: Chronic  ? Spondylolisthesis, lumbar region   ? Other spondylosis with radiculopathy, lumbar region   ? Other secondary scoliosis, lumbar region   ? Fusion of spine of lumbar region 03/25/2021  ? Colon polyps 06/06/2020  ? Lupus ([facility]) 06/06/2020  ? Allergic rhinitis due to animal (cat) (dog)  hair and dander 04/08/2020  ? Allergic rhinitis due to pollen 04/08/2020  ? Food allergy 04/08/2020  ? Acute medial meniscus tear, left, subsequent encounter 03/21/2020  ? Chronic pain of left knee 02/15/2020  ? Paresthesia of skin 11/16/2019  ? History of total knee replacement, right 11/16/2019  ? Tobacco abuse 11/16/2019  ? Centrilobular emphysema ([facility]) 08/10/2019  ? Incidental lung nodule, > 3mm and < 8mm 08/10/2019  ? OSA on CPAP 08/10/2019  ? Dyspnea on exertion 08/02/2019  ? Hyperlipidemia 08/02/2019  ? Lumbar radiculopathy 04/24/2019  ? 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  ? Post laminectomy syndrome 02/23/2019  ? Abnormality of gait 02/23/2019  ? 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  ? Unilateral primary osteoarthritis, left hip 12/28/2018  ? Lesion of skin of left ear 12/26/2018  ? Primary osteoarthritis of left hip 12/02/2018  ? Iron deficiency anemia 10/16/2018  ? Chronic pain syndrome 09/08/2018  ? Chronic pain of right knee 08/11/2018  ? Status post lumbar laminectomy 07/15/2018  ? Peripheral arterial disease ([facility]) 04/05/2018  ? Moderate persistent asthma without complication 06/29/2017  ? Environmental and seasonal allergies 06/29/2017  ? Controlled type 2 diabetes mellitus with diabetic polyneuropathy, without long-term current use of insulin ([facility]) 06/29/2017  ? Perennial allergic rhinitis 04/08/2017  ? Sensorineural hearing loss (SNHL), bilateral 04/08/2017  ? Chronic pansinusitis 03/25/2017  ? Eustachian  tube dysfunction, bilateral 03/25/2017  ? 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 Alopecia areata 08/19/2016  ? Spinal stenosis, lumbar region, with neurogenic claudication 06/03/2015  ? Tobacco use disorder 04/25/2015  ? DJD (degenerative joint disease) of knee 01/04/2015  ? Hemorrhoid 11/14/2014  ? Gout of big toe 07/19/2014  ? Essential hypertension 08/14/2013  ? Gastroesophageal reflux disease without esophagitis 08/14/2013  ? COPD (chronic obstructive pulmonary disease) ([facility]) 04/17/2011   ? ?Past Medical History:  ?Diagnosis Date  ? Allergy   ? Shellfish, cleaning products  ? Anxiety   ? Arthritis   ? Arthrofibrosis of total knee replacement ([facility])   ? right  ? Asthma   ? COPD (chronic obstructive pulmonary disease) ([facility])   ? Depression   ? Diabetes mellitus   ? Type II  ? GERD (gastroesophageal reflux disease)   ? Pt on Protonix daily  ? Glaucoma   ? Gout   ? Headache(784.0)   ? otc meds prn  ? Hyperlipidemia   ? Hypertension   ? Irritable bowel syndrome 11/19/2010  ? Neuropathy   ? Pneumonia YRS AGO  ? Restless legs   ? Shortness of breath   ? 07/14/2018- uses  4 times a day  ? Sleep apnea   ?  ?Family History  ?Problem Relation Age of Onset  ? Hypertension Father   ? Cancer Father   ? Heart disease Mother   ? Asthma Son   ?     had as a child  ? Heart disease Sister   ? Breast cancer Sister   ? Hypertension Brother   ?  ?Past Surgical History:  ?Procedure Laterality Date  ? CHOLECYSTECTOMY    ? COLONOSCOPY    ? ENDOMETRIAL ABLATION  10/2010  ? HERNIA REPAIR    ? umbicial hernia  ? JOINT REPLACEMENT Left 03/14/2019  ? Dr. Magnus Ivan hip  ? KNEE ARTHROSCOPY Left   ? 06/07/2017 Dr. August Saucer of [facility]  ? KNEE ARTHROSCOPY Left 03/21/2020  ? Procedure: LEFT KNEE ARTHROSCOPY WITH PARTIAL MEDIAL MENISCECTOMY;  Surgeon: [name], MD;  Location: [facility];  Service: Orthopedics;  Laterality: Left;  ? KNEE CLOSED REDUCTION Right 12/06/2015  ? Procedure: CLOSED MANIPULATION RIGHT KNEE;  Surgeon: Kerrin Champagne, MD;  Location[facility];  Service: Orthopedics;  Laterality: Right;  ? KNEE CLOSED REDUCTION Right 01/17/2016  ? Procedure: CLOSED MANIPULATION RIGHT KNEE;  Surgeon: Kerrin Champagne, MD;  Location: [facility];  Service: Orthopedics;  Laterality: Right;  ? KNEE JOINT MANIPULATION Right 12/06/2015  ? LACRIMAL TUBE INSERTION Bilateral 03/01/2019  ? Procedure: LACRIMAL TUBE INSERTION;  Surgeon: Aura Camps, MD;  Location[facility];  Service: Ophthalmology;   Laterality: Bilateral;  ? LACRIMAL TUBE REMOVAL Bilateral 05/03/2019  ? Procedure: BILATERAL NASOLACRIMAL DUCT PROBING, IIRIGATION AND TUBE REMOVAL BOTH EYES;  Surgeon: Aura Camps, MD;  Location: [facility];  Service: Ophthalmology;  Laterality: Bilateral;  ? LUMBAR LAMINECTOMY/DECOMPRESSION MICRODISCECTOMY N/A 06/03/2015  ? Procedure: Bilateral lateral recess decompression L2-3, L3-4, L4-5;  Surgeon: Kerrin Champagne, MD;  Location[facility];  Service: Orthopedics;  Laterality: N/A;  ? LUMBAR LAMINECTOMY/DECOMPRESSION MICRODISCECTOMY N/A 10/02/2016  ? Procedure: Right L5-S1 Lateral Recess Decompression  microdiscectomy;  Surgeon: Kerrin Champagne, MD;  Location: [facility];  Service: Orthopedics;  Laterality: N/A;  ? LUMBAR LAMINECTOMY/DECOMPRESSION MICRODISCECTOMY N/A 07/15/2018  ? Procedure: LEFT L3-4 MICRODISCECTOMY;  Surgeon: Kerrin Champagne, MD;  Location: [facility];  Service: Orthopedics;  Laterality: N/A;  ?  svd     ? x 2  ? TEAR DUCT PROBING Bilateral 03/01/2019  ? Procedure: TEAR DUCT PROBING WITH IRRIGATION;  Surgeon: Aura Camps, MD;  Location: [facility];  Service: Ophthalmology;  Laterality: Bilateral;  ? TOTAL HIP ARTHROPLASTY Left 03/14/2019  ? Procedure: LEFT TOTAL HIP ARTHROPLASTY ANTERIOR APPROACH;  Surgeon: [name], MD;  Location: [facility];  Service: Orthopedics;  Laterality: Left;  ? TOTAL KNEE ARTHROPLASTY Right 09/06/2015  ? Procedure: RIGHT TOTAL KNEE ARTHROPLASTY;  Surgeon: Kerrin Champagne, MD;  Location: [facility];  Service: Orthopedics;  Laterality: Right;  ? TUBAL LIGATION    ? UPPER GASTROINTESTINAL ENDOSCOPY  04/28/2011  ? ?Social History  ? ?Occupational History  ? Occupation: unemployed  ?  Employer: UNEMPLOYED  ?Tobacco Use  ? Smoking status: Some Days  ?  Packs/day: 0.25  ?  Years: 32.00  ?  Pack years: 8.00  ?  Types: Cigarettes  ? Smokeless tobacco: Never  ? Tobacco comments:  ?  About 1-2 cigarettes a day  ?Vaping Use  ? Vaping Use: Never used   ?Substance and Sexual Activity  ? Alcohol use: No  ? Drug use: No  ? Sexual activity: Yes  ?  Birth control/protection: Surgical, Post-menopausal  ?  Comment: tubal ligation  ? ? ? ?

## 2021-06-09 NOTE — Patient Instructions (Signed)
Plan: Avoid frequent bending and stooping  ?No lifting greater than 10 lbs. ?May use ice or moist heat for pain. ?Weight loss is of benefit. ?Best medication for lumbar disc disease is arthritis medications like motrin, celebrex and naprosyn. ?Exercise is important to improve your indurance and does allow people to function better inspite of back pain. ? Start weaning from your brace, may remove for 2 hours a day for one week then starting next week begin going half days without brace for 2 weeks then stop the use of the brace. ?

## 2021-06-11 DIAGNOSIS — F331 Major depressive disorder, recurrent, moderate: Secondary | ICD-10-CM | POA: Diagnosis not present

## 2021-06-18 DIAGNOSIS — F331 Major depressive disorder, recurrent, moderate: Secondary | ICD-10-CM | POA: Diagnosis not present

## 2021-06-25 DIAGNOSIS — F331 Major depressive disorder, recurrent, moderate: Secondary | ICD-10-CM | POA: Diagnosis not present

## 2021-06-27 DIAGNOSIS — L930 Discoid lupus erythematosus: Secondary | ICD-10-CM | POA: Diagnosis not present

## 2021-07-02 DIAGNOSIS — F331 Major depressive disorder, recurrent, moderate: Secondary | ICD-10-CM | POA: Diagnosis not present

## 2021-07-03 ENCOUNTER — Other Ambulatory Visit: Payer: Self-pay | Admitting: Internal Medicine

## 2021-07-03 ENCOUNTER — Other Ambulatory Visit: Payer: Self-pay | Admitting: Specialist

## 2021-07-03 DIAGNOSIS — I739 Peripheral vascular disease, unspecified: Secondary | ICD-10-CM

## 2021-07-09 DIAGNOSIS — F331 Major depressive disorder, recurrent, moderate: Secondary | ICD-10-CM | POA: Diagnosis not present

## 2021-07-16 DIAGNOSIS — F331 Major depressive disorder, recurrent, moderate: Secondary | ICD-10-CM | POA: Diagnosis not present

## 2021-07-23 ENCOUNTER — Ambulatory Visit (INDEPENDENT_AMBULATORY_CARE_PROVIDER_SITE_OTHER): Payer: Medicaid Other | Admitting: Specialist

## 2021-07-23 ENCOUNTER — Ambulatory Visit (INDEPENDENT_AMBULATORY_CARE_PROVIDER_SITE_OTHER): Payer: Medicaid Other

## 2021-07-23 ENCOUNTER — Encounter: Payer: Self-pay | Admitting: Specialist

## 2021-07-23 VITALS — BP 156/79 | HR 116 | Ht 66.0 in | Wt 154.0 lb

## 2021-07-23 DIAGNOSIS — Z981 Arthrodesis status: Secondary | ICD-10-CM | POA: Diagnosis not present

## 2021-07-23 DIAGNOSIS — M1712 Unilateral primary osteoarthritis, left knee: Secondary | ICD-10-CM

## 2021-07-23 DIAGNOSIS — Z96651 Presence of right artificial knee joint: Secondary | ICD-10-CM | POA: Diagnosis not present

## 2021-07-23 DIAGNOSIS — M25562 Pain in left knee: Secondary | ICD-10-CM | POA: Diagnosis not present

## 2021-07-23 DIAGNOSIS — F331 Major depressive disorder, recurrent, moderate: Secondary | ICD-10-CM | POA: Diagnosis not present

## 2021-07-23 DIAGNOSIS — T8484XD Pain due to internal orthopedic prosthetic devices, implants and grafts, subsequent encounter: Secondary | ICD-10-CM | POA: Diagnosis not present

## 2021-07-23 NOTE — Patient Instructions (Addendum)
Plan: Avoid frequent bending and stooping  No lifting greater than 10 lbs. May use ice or moist heat for pain. Weight loss is of benefit. Best medication for lumbar disc disease is arthritis medications like motrin, celebrex and naprosyn. Exercise is important to improve your indurance and does allow people to function better inspite of back pain. Tramadol for more severe pain otherwise tylenol and celebrex.

## 2021-07-23 NOTE — Progress Notes (Signed)
Office Visit Note   Patient: Stacy [name]           Date of Birth: Feb 14, 1960           MRN: [id number] Visit Date: 07/23/2021              Requested by: Marcine Matar, MD [address] [place] [address] [place],  [place] [address] PCP: Marcine Matar, MD   Assessment & Plan: Visit Diagnoses:  1. S/P lumbar fusion   2. Pain due to total right knee replacement, subsequent encounter   3. Osteoarthritis of left patellofemoral joint   4. Left knee pain, unspecified chronicity     Plan: Avoid frequent bending and stooping  No lifting greater than 10 lbs. May use ice or moist heat for pain. Weight loss is of benefit. Best medication for lumbar disc disease is arthritis medications like motrin, celebrex and naprosyn. Exercise is important to improve your indurance and does allow people to function better inspite of back pain.    Follow-Up Instructions: Return in about 3 months (around 10/23/2021).   Orders:  Orders Placed This Encounter  Procedures   XR Lumbar Spine 2-3 Views   No orders of the defined types were placed in this encounter.     Procedures: No procedures performed   Clinical Data: No additional findings.   Subjective: Chief Complaint  Patient presents with   Lower Back - Follow-up    61 year old female with history of lumbar fusion and right TKR, left patellofemoral disease. She is nearly 4 months post op 3 level    Review of Systems   Objective: Vital Signs: BP (!) 156/79 (BP Location: Left Arm, Patient Position: Sitting)   Pulse (!) 116   Ht 5\' 6"  (1.676 m)   Wt 154 lb (69.9 kg)   LMP 09/01/2010   BMI 24.86 kg/m   Physical Exam  Ortho Exam  Specialty Comments:  No specialty comments available.  Imaging: XR Lumbar Spine 2-3 Views  Result Date: 07/23/2021 AP and lateral flexion and extension radiographs show about 6 mm of difference flexion to extension of the lumbar spine measured across the L2 to L5 levels with  hardware in good position and alignment.  No loosening of screws. Radiographs suggest that there is a difference in the screw pattern suggesting that there is likely variation in the measurement across the lumbar spine due to some rotation with flexion and extension. Bridging of bone appear to be Occurring across the anterior disc spaces L2-3, L3-4 and L4-5. Recommend follow up radiographs in 3 months.    PMFS History: Patient Active Problem List   Diagnosis Date Noted   Herniation of lumbar intervertebral disc with radiculopathy 10/02/2016    Priority: High    Class: Chronic   Chondromalacia of both patellae 06/03/2015    Priority: High    Class: Chronic   Spondylolisthesis, lumbar region    Other spondylosis with radiculopathy, lumbar region    Other secondary scoliosis, lumbar region    Fusion of spine of lumbar region 03/25/2021   Colon polyps 06/06/2020   Lupus (HCC) 06/06/2020   Allergic rhinitis due to animal (cat) (dog) hair and dander 04/08/2020   Allergic rhinitis due to pollen 04/08/2020   Food allergy 04/08/2020   Acute medial meniscus tear, left, subsequent encounter 03/21/2020   Chronic pain of left knee 02/15/2020   Paresthesia of skin 11/16/2019   History of total knee replacement, right 11/16/2019   Tobacco abuse  11/16/2019   Centrilobular emphysema ([place]) 08/10/2019   Incidental lung nodule, > 60mm and < 74mm 08/10/2019   OSA on CPAP 08/10/2019   Dyspnea on exertion 08/02/2019   Hyperlipidemia 08/02/2019   Lumbar radiculopathy 04/24/2019   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   Post laminectomy syndrome 02/23/2019   Abnormality of gait 02/23/2019   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   Unilateral primary osteoarthritis, left hip 12/28/2018   Lesion of skin of left ear 12/26/2018   Primary osteoarthritis of left hip 12/02/2018   Iron deficiency anemia 10/16/2018   Chronic pain syndrome 09/08/2018   Chronic pain of right knee 08/11/2018   Status post lumbar  laminectomy 07/15/2018   Peripheral arterial disease ([place]) 04/05/2018   Moderate persistent asthma without complication [id number]   Environmental and seasonal allergies 06/29/2017   Controlled type 2 diabetes mellitus with diabetic polyneuropathy, without long-term current use of insulin ([place]) 06/29/2017   Perennial allergic rhinitis 04/08/2017   Sensorineural hearing loss (SNHL), bilateral 04/08/2017   Chronic pansinusitis 03/25/2017   Eustachian tube dysfunction, bilateral 0000000   Lichen planopilaris [id number]   Alopecia areata 08/19/2016   Spinal stenosis, lumbar region, with neurogenic claudication 06/03/2015   Tobacco use disorder 04/25/2015   DJD (degenerative joint disease) of knee 01/04/2015   Hemorrhoid 11/14/2014   Gout of big toe 07/19/2014   Essential hypertension 08/14/2013   Gastroesophageal reflux disease without esophagitis 08/14/2013   COPD (chronic obstructive pulmonary disease) ([place]) 04/17/2011   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   Asthma    COPD (chronic obstructive pulmonary disease) ([place])    Depression    Diabetes mellitus    Type II   GERD (gastroesophageal reflux disease)    Pt on Protonix daily   Glaucoma    Gout    Headache(784.0)    otc meds prn   Hyperlipidemia    Hypertension    Irritable bowel syndrome 11/19/2010   Neuropathy    Pneumonia YRS AGO   Restless legs    Shortness of breath    07/14/2018- uses  4 times a day   Sleep apnea     Family History  Problem Relation Age of Onset   Hypertension Father    Cancer Father    Heart disease Mother    Asthma Son        had as a child   Heart disease Sister    Breast cancer Sister    Hypertension Brother     Past Surgical History:  Procedure Laterality Date   CHOLECYSTECTOMY     COLONOSCOPY     ENDOMETRIAL ABLATION  10/2010   HERNIA REPAIR      umbicial hernia   JOINT REPLACEMENT Left 03/14/2019   Dr. Ninfa Linden hip   KNEE ARTHROSCOPY Left    06/07/2017 Dr. Marlou Sa of [place] ARTHROSCOPY Left 03/21/2020   Procedure: LEFT KNEE ARTHROSCOPY WITH PARTIAL MEDIAL MENISCECTOMY;  Surgeon: Mcarthur Rossetti, MD;  Location: [facility];  Service: Orthopedics;  Laterality: Left;   KNEE CLOSED REDUCTION Right 12/06/2015   Procedure: CLOSED MANIPULATION RIGHT KNEE;  Surgeon: [name], MD;  Location: [place];  Service: Orthopedics;  Laterality: Right;   KNEE CLOSED REDUCTION Right 01/17/2016   Procedure: CLOSED MANIPULATION RIGHT KNEE;  Surgeon: [name], MD;  Location: [facility];  Service: Orthopedics;  Laterality: Right;   KNEE JOINT MANIPULATION Right 12/06/2015   LACRIMAL TUBE INSERTION Bilateral 03/01/2019   Procedure: LACRIMAL TUBE INSERTION;  Surgeon: Aura Camps, MD;  Location: [facility];  Service: Ophthalmology;  Laterality: Bilateral;   LACRIMAL TUBE REMOVAL Bilateral 05/03/2019   Procedure: BILATERAL NASOLACRIMAL DUCT PROBING, IIRIGATION AND TUBE REMOVAL BOTH EYES;  Surgeon: Aura Camps, MD;  Location: [facility];  Service: Ophthalmology;  Laterality: Bilateral;   LUMBAR LAMINECTOMY/DECOMPRESSION MICRODISCECTOMY N/A 06/03/2015   Procedure: Bilateral lateral recess decompression L2-3, L3-4, L4-5;  Surgeon: Kerrin Champagne, MD;  Location: [facility];  Service: Orthopedics;  Laterality: N/A;   LUMBAR LAMINECTOMY/DECOMPRESSION MICRODISCECTOMY N/A 10/02/2016   Procedure: Right L5-S1 Lateral Recess Decompression  microdiscectomy;  Surgeon: Kerrin Champagne, MD;  Location[facility];  Service: Orthopedics;  Laterality: N/A;   LUMBAR LAMINECTOMY/DECOMPRESSION MICRODISCECTOMY N/A 07/15/2018   Procedure: LEFT L3-4 MICRODISCECTOMY;  Surgeon: Kerrin Champagne, MD;  Location: [facility] OR;  Service: Orthopedics;  Laterality: N/A;   svd      x 2   TEAR DUCT PROBING Bilateral 03/01/2019   Procedure:  TEAR DUCT PROBING WITH IRRIGATION;  Surgeon: Aura Camps, MD;  [facility];  Service: Ophthalmology;  Laterality: Bilateral;   TOTAL HIP ARTHROPLASTY Left 03/14/2019   Procedure: LEFT TOTAL HIP ARTHROPLASTY ANTERIOR APPROACH;  Surgeon: [name], MD;  [facility];  Service: Orthopedics;  Laterality: Left;   TOTAL KNEE ARTHROPLASTY Right 09/06/2015   Procedure: RIGHT TOTAL KNEE ARTHROPLASTY;  Surgeon: Kerrin Champagne, MD;  Location: [facility];  Service: Orthopedics;  Laterality: Right;   TUBAL LIGATION     UPPER GASTROINTESTINAL ENDOSCOPY  04/28/2011   Social History   Occupational History   Occupation: unemployed    Associate Professor: UNEMPLOYED  Tobacco Use   Smoking status: Some Days    Packs/day: 0.25    Years: 32.00    Total pack years: 8.00    Types: Cigarettes   Smokeless tobacco: Never   Tobacco comments:    About 1-2 cigarettes a day  Vaping Use   Vaping Use: Never used  Substance and Sexual Activity   Alcohol use: No   Drug use: No   Sexual activity: Yes    Birth control/protection: Surgical, Post-menopausal    Comment: tubal ligation

## 2021-07-30 DIAGNOSIS — F331 Major depressive disorder, recurrent, moderate: Secondary | ICD-10-CM | POA: Diagnosis not present

## 2021-08-01 ENCOUNTER — Other Ambulatory Visit: Payer: Self-pay | Admitting: Internal Medicine

## 2021-08-01 DIAGNOSIS — E1142 Type 2 diabetes mellitus with diabetic polyneuropathy: Secondary | ICD-10-CM

## 2021-08-01 DIAGNOSIS — F17200 Nicotine dependence, unspecified, uncomplicated: Secondary | ICD-10-CM

## 2021-08-01 NOTE — Telephone Encounter (Signed)
Requested Prescriptions  Pending Prescriptions Disp Refills  . nicotine (NICODERM CQ - DOSED IN MG/24 HOURS) 14 mg/24hr patch [Pharmacy Med Name: NICOTINE 14 MG/24HR TRANSDERMAL PATCH 24 HOUR]      Sig: PLACE 1 PATCH ONTO THE SKIN DAILY     Psychiatry:  Drug Dependence Therapy Passed - 08/01/2021  9:50 AM      Passed - Valid encounter within last 12 months    Recent Outpatient Visits          3 months ago Hospital discharge follow-up   [place] [name], MD   3 months ago Generalized pruritus   [place] Gildardo Pounds, NP   6 months ago Preoperative evaluation to rule out surgical contraindication   [place], MD   7 months ago Pap smear for cervical cancer screening   [place], MD   11 months ago Fingernail injury, left, initial encounter   [facility], Loraine Grip, PA-C      Future Appointments            In 1 month [name] Dalbert Batman, MD [place]   In 2 months [name], Daleen Bo, MD [facility]           . metFORMIN (GLUCOPHAGE-XR) 750 MG 24 hr tablet [Pharmacy Med Name: METFORMIN HCL ER 750 MG ORAL TABLET EXTENDED RELEASE 24 HOUR] 60 tablet 1    Sig: TAKE 1 TABLET (750 MG TOTAL) BY MOUTH 2 (TWO) TIMES DAILY (AM+BEDTIME)     Endocrinology:  Diabetes - Biguanides Failed - 08/01/2021  9:50 AM      Failed - Cr in normal range and within 360 days    Creat  Date Value Ref Range Status  03/11/2016 0.90 0.50 - 1.05 mg/dL Final    Comment:      For patients > or = 62 years of age: The upper reference limit for Creatinine is approximately 13% higher for people identified as African-American.      Creatinine, Ser  Date Value Ref Range Status  03/26/2021 1.06 (H) 0.44 - 1.00 mg/dL Final   Creatinine, POC  Date Value Ref Range Status   06/10/2016 200 mg/dL Final         Failed - eGFR in normal range and within 360 days    GFR, Est African American  Date Value Ref Range Status  03/11/2016 83 >=60 mL/min Final   GFR calc Af Amer  Date Value Ref Range Status  05/03/2019 >60 >60 mL/min Final   GFR, Est Non African American  Date Value Ref Range Status  03/11/2016 72 >=60 mL/min Final   GFR, Estimated  Date Value Ref Range Status  03/26/2021 60 (L) >60 mL/min Final    Comment:    (NOTE) Calculated using the CKD-EPI Creatinine Equation (2021)    eGFR  Date Value Ref Range Status  01/20/2021 69 >59 mL/min/1.73 Final         Failed - B12 Level in normal range and within 720 days    No results found for: "VITAMINB12"       Passed - HBA1C is between 0 and 7.9 and within 180 days    HbA1c, POC (prediabetic range)  Date Value Ref Range Status  08/10/2019 5.9 5.7 - 6.4 % Final   HbA1c, POC (controlled diabetic  range)  Date Value Ref Range Status  01/20/2021 6.3 0.0 - 7.0 % Final   Hgb A1c MFr Bld  Date Value Ref Range Status  03/25/2021 5.8 (H) 4.8 - 5.6 % Final    Comment:    (NOTE)         Prediabetes: 5.7 - 6.4         Diabetes: >6.4         Glycemic control for adults with diabetes: <7.0          Passed - Valid encounter within last 6 months    Recent Outpatient Visits          3 months ago Hospital discharge follow-up   [place] [name], MD   3 months ago Generalized pruritus   [place] [facility], Vernia Buff, NP   6 months ago Preoperative evaluation to rule out surgical contraindication   [place] [name], MD   7 months ago Pap smear for cervical cancer screening   [place] [name], MD   11 months ago Fingernail injury, left, initial encounter   [facility] at [facility], Loraine Grip, PA-C      Future Appointments             In 1 month [name], MD [place]   In 2 months [name], Daleen Bo, MD [place] - CBC within normal limits and completed in the last 12 months    WBC  Date Value Ref Range Status  05/01/2021 7.1 3.4 - 10.8 x10E3/uL Final  03/28/2021 7.8 4.0 - 10.5 K/uL Final   RBC  Date Value Ref Range Status  05/01/2021 4.30 3.77 - 5.28 x10E6/uL Final  03/28/2021 3.25 (L) 3.87 - 5.11 MIL/uL Final   Hemoglobin  Date Value Ref Range Status  05/01/2021 11.3 11.1 - 15.9 g/dL Final   Hematocrit  Date Value Ref Range Status  05/01/2021 34.7 34.0 - 46.6 % Final   MCHC  Date Value Ref Range Status  05/01/2021 32.6 31.5 - 35.7 g/dL Final  03/28/2021 33.2 30.0 - 36.0 g/dL Final   [facility]  Date Value Ref Range Status  05/01/2021 26.3 (L) 26.6 - 33.0 pg Final  03/28/2021 26.2 26.0 - 34.0 pg Final   MCV  Date Value Ref Range Status  05/01/2021 81 79 - 97 fL Final   No results found for: "PLTCOUNTKUC", "LABPLAT", "POCPLA" RDW  Date Value Ref Range Status  05/01/2021 15.7 (H) 11.7 - 15.4 % Final

## 2021-08-06 DIAGNOSIS — F331 Major depressive disorder, recurrent, moderate: Secondary | ICD-10-CM | POA: Diagnosis not present

## 2021-08-13 DIAGNOSIS — F331 Major depressive disorder, recurrent, moderate: Secondary | ICD-10-CM | POA: Diagnosis not present

## 2021-08-15 ENCOUNTER — Ambulatory Visit: Payer: Medicaid Other | Admitting: Podiatry

## 2021-08-15 ENCOUNTER — Encounter: Payer: Self-pay | Admitting: Podiatry

## 2021-08-15 DIAGNOSIS — E1142 Type 2 diabetes mellitus with diabetic polyneuropathy: Secondary | ICD-10-CM

## 2021-08-15 DIAGNOSIS — B351 Tinea unguium: Secondary | ICD-10-CM | POA: Diagnosis not present

## 2021-08-15 DIAGNOSIS — I739 Peripheral vascular disease, unspecified: Secondary | ICD-10-CM | POA: Diagnosis not present

## 2021-08-15 DIAGNOSIS — M79676 Pain in unspecified toe(s): Secondary | ICD-10-CM

## 2021-08-20 DIAGNOSIS — F331 Major depressive disorder, recurrent, moderate: Secondary | ICD-10-CM | POA: Diagnosis not present

## 2021-08-21 NOTE — Progress Notes (Signed)
  Subjective:  Patient ID: Stacy [name], female    DOB: Jul 18, 1960,  MRN: [id number]  Stacy [name] presents to clinic today for at risk foot care. Pt has h/o NIDDM with PAD and painful thick toenails that are difficult to trim. Pain interferes with ambulation. Aggravating factors include wearing enclosed shoe gear. Pain is relieved with periodic professional debridement.  Last A1c was 5.8%.  Patient did not check blood glucose today.  New problem(s): None.   She is recovering well from her back surgery.  PCP is Marcine Matar, MD , and last visit was  May 01, 2021  Allergies  Allergen Reactions   Other Shortness Of Breath    UNSPECIFIED AGENTS Allergic to perfumes and cleaning products   Shellfish Allergy Anaphylaxis    Per allergy test.   Ace Inhibitors Cough and Other (See Comments)        Celecoxib      upset stomach  Pt is taking med    Aspirin Nausea Only    stomach upset     Review of Systems: Negative except as noted in the HPI.  Objective: No changes noted in today's physical examination.  Constitutional Stacy [name] is a pleasant 62 y.o. African American female, WD, WN in NAD. AAO x 3.   Vascular Capillary refill time to digits <4 seconds b/l lower extremities. Faintly palpable pedal pulses b/l. Pedal hair absent. Lower extremity skin temperature gradient within normal limits. No pain with calf compression b/l. No edema noted b/l lower extremities. No cyanosis or clubbing noted.  Neurologic Normal speech. Oriented to person, place, and time. Pt has subjective symptoms of neuropathy. Protective sensation intact 5/5 intact bilaterally with 10g monofilament b/l.  Dermatologic Skin warm and supple b/l lower extremities. No open wounds b/l lower extremities. No interdigital macerations b/l lower extremities. Toenails 1-5 b/l elongated, discolored, dystrophic, thickened, crumbly with subungual debris and tenderness to dorsal palpation. No  hyperkeratotic or porokeratotic lesions noted on today's visit.  Orthopedic: Normal muscle strength 5/5 to all lower extremity muscle groups bilaterally. Hallux valgus with bunion deformity noted b/l lower extremities.  Patient using cane for mobility assistance today.   Radiographs: None    Latest Ref Rng & Units 03/25/2021    3:35 PM 03/20/2021    8:43 AM 01/20/2021    3:09 PM  Hemoglobin A1C  Hemoglobin-A1c 4.8 - 5.6 % 5.8  5.7  6.3    Assessment/Plan: 1. Pain due to onychomycosis of toenail   2. PAD (peripheral artery disease) (HCC)   3. Diabetic peripheral neuropathy associated with type 2 diabetes mellitus (HCC)      -Examined patient. -No new findings. No new orders. -Continue foot and shoe inspections daily. Monitor blood glucose per PCP/Endocrinologist's recommendations. -Patient to continue soft, supportive shoe gear daily. -Mycotic toenails 1-5 bilaterally were debrided in length and girth with sterile nail nippers and dremel without incident. -Patient/POA to call should there be question/concern in the interim.   Return in about 3 months (around 11/15/2021).  [name], DPM

## 2021-08-27 DIAGNOSIS — F331 Major depressive disorder, recurrent, moderate: Secondary | ICD-10-CM | POA: Diagnosis not present

## 2021-09-01 ENCOUNTER — Other Ambulatory Visit: Payer: Self-pay | Admitting: Specialist

## 2021-09-01 ENCOUNTER — Other Ambulatory Visit (INDEPENDENT_AMBULATORY_CARE_PROVIDER_SITE_OTHER): Payer: Self-pay | Admitting: Surgery

## 2021-09-01 ENCOUNTER — Other Ambulatory Visit: Payer: Self-pay | Admitting: Internal Medicine

## 2021-09-01 DIAGNOSIS — M109 Gout, unspecified: Secondary | ICD-10-CM

## 2021-09-01 DIAGNOSIS — E1142 Type 2 diabetes mellitus with diabetic polyneuropathy: Secondary | ICD-10-CM

## 2021-09-01 DIAGNOSIS — I10 Essential (primary) hypertension: Secondary | ICD-10-CM

## 2021-09-02 ENCOUNTER — Ambulatory Visit: Payer: Medicaid Other | Attending: Internal Medicine | Admitting: Internal Medicine

## 2021-09-02 ENCOUNTER — Other Ambulatory Visit: Payer: Self-pay | Admitting: Internal Medicine

## 2021-09-02 ENCOUNTER — Encounter: Payer: Self-pay | Admitting: Internal Medicine

## 2021-09-02 VITALS — BP 110/70 | HR 91 | Temp 99.2°F | Resp 16 | Ht 66.0 in | Wt 156.0 lb

## 2021-09-02 DIAGNOSIS — J432 Centrilobular emphysema: Secondary | ICD-10-CM

## 2021-09-02 DIAGNOSIS — Z9989 Dependence on other enabling machines and devices: Secondary | ICD-10-CM | POA: Diagnosis not present

## 2021-09-02 DIAGNOSIS — Z87891 Personal history of nicotine dependence: Secondary | ICD-10-CM

## 2021-09-02 DIAGNOSIS — Z23 Encounter for immunization: Secondary | ICD-10-CM | POA: Diagnosis not present

## 2021-09-02 DIAGNOSIS — E11649 Type 2 diabetes mellitus with hypoglycemia without coma: Secondary | ICD-10-CM | POA: Diagnosis not present

## 2021-09-02 DIAGNOSIS — G4733 Obstructive sleep apnea (adult) (pediatric): Secondary | ICD-10-CM

## 2021-09-02 DIAGNOSIS — I10 Essential (primary) hypertension: Secondary | ICD-10-CM | POA: Diagnosis not present

## 2021-09-02 LAB — GLUCOSE, POCT (MANUAL RESULT ENTRY)
POC Glucose: 63 mg/dl — AB (ref 70–99)
POC Glucose: 79 mg/dl (ref 70–99)

## 2021-09-02 LAB — POCT GLYCOSYLATED HEMOGLOBIN (HGB A1C): HbA1c, POC (prediabetic range): 6.3 % (ref 5.7–6.4)

## 2021-09-02 MED ORDER — GLUCAGON 1 MG/0.2ML ~~LOC~~ SOSY: PREFILLED_SYRINGE | SUBCUTANEOUS | 0 refills | Status: DC

## 2021-09-02 MED ORDER — GLUCOSE 4 G PO CHEW: 3.0000 | CHEWABLE_TABLET | Freq: Once | ORAL | Status: AC

## 2021-09-02 NOTE — Telephone Encounter (Signed)
Requested Prescriptions  Pending Prescriptions Disp Refills  . hydrOXYzine (ATARAX) 10 MG tablet [Pharmacy Med Name: HYDROXYZINE HCL 10 MG ORAL TABLET] 120 tablet     Sig: TAKE 1 TABLET IN THE MORNING AND NOON AND 2 TABLETS IN THE EVENING AS NEEDED.     Ear, Nose, and Throat:  Antihistamines 2 Failed - 09/01/2021  1:57 PM      Failed - Cr in normal range and within 360 days    Creat  Date Value Ref Range Status  03/11/2016 0.90 0.50 - 1.05 mg/dL Final    Comment:      For patients > or = 62 years of age: The upper reference limit for Creatinine is approximately 13% higher for people identified as African-American.      Creatinine, Ser  Date Value Ref Range Status  03/26/2021 1.06 (H) 0.44 - 1.00 mg/dL Final   Creatinine, POC  Date Value Ref Range Status  06/10/2016 200 mg/dL Final         Passed - Valid encounter within last 12 months    Recent Outpatient Visits          Today Type 2 diabetes mellitus with hypoglycemia without coma, without long-term current use of insulin [facility])   [place], Deborah B, MD   4 months ago Hospital discharge follow-up   [place], MD   4 months ago Generalized pruritus   [place], Zelda W, NP   7 months ago Preoperative evaluation to rule out surgical contraindication   [place], MD   8 months ago Pap smear for cervical cancer screening   [place], Deborah B, MD      Future Appointments            In 1 month [name], Daleen Bo, MD [place]   In 4 months [name], MD [place]           . [name] 325 (65 Fe) MG tablet [Pharmacy Med Name: [name] 325 (65 FE) MG ORAL TABLET] 100 tablet 0    Sig: TAKE ONE TABLET ( 325 MG ) BY MOUTH DAILY (AM)     Endocrinology:   Minerals - Iron Supplementation Failed - 09/01/2021  1:57 PM      Failed - Fe (serum) in normal range and within 360 days    Iron  Date Value Ref Range Status  10/14/2018 15 (L) 27 - 159 ug/dL Final   Iron Saturation  Date Value Ref Range Status  10/14/2018 3 (LL) 15 - 55 % Final         Failed - Ferritin in normal range and within 360 days    Ferritin  Date Value Ref Range Status  10/14/2018 19 15 - 150 ng/mL Final         Passed - HGB in normal range and within 360 days    Hemoglobin  Date Value Ref Range Status  05/01/2021 11.3 11.1 - 15.9 g/dL Final         Passed - HCT in normal range and within 360 days    Hematocrit  Date Value Ref Range Status  05/01/2021 34.7 34.0 - 46.6 % Final         Passed - RBC in normal range and within 360 days  RBC  Date Value Ref Range Status  05/01/2021 4.30 3.77 - 5.28 x10E6/uL Final  03/28/2021 3.25 (L) 3.87 - 5.11 MIL/uL Final         Passed - Valid encounter within last 12 months    Recent Outpatient Visits          Today Type 2 diabetes mellitus with hypoglycemia without coma, without long-term current use of insulin [facility])   [place] [name], MD   4 months ago Hospital discharge follow-up   [place], MD   4 months ago Generalized pruritus   [place], Zelda W, NP   7 months ago Preoperative evaluation to rule out surgical contraindication   [place], MD   8 months ago Pap smear for cervical cancer screening   [place], Deborah B, MD      Future Appointments            In 1 month [name], Daleen Bo, MD [place]   In 4 months [name], MD [place]           . loratadine (CLARITIN) 10 MG tablet [Pharmacy Med Name: LORATADINE 10 MG ORAL  TABLET] 90 tablet     Sig: TAKE 1 TABLET (10 MG TOTAL) BY MOUTH DAILY. (AM)     Ear, Nose, and Throat:  Antihistamines 2 Failed - 09/01/2021  1:57 PM      Failed - Cr in normal range and within 360 days    Creat  Date Value Ref Range Status  03/11/2016 0.90 0.50 - 1.05 mg/dL Final    Comment:      For patients > or = 62 years of age: The upper reference limit for Creatinine is approximately 13% higher for people identified as African-American.      Creatinine, Ser  Date Value Ref Range Status  03/26/2021 1.06 (H) 0.44 - 1.00 mg/dL Final   Creatinine, POC  Date Value Ref Range Status  06/10/2016 200 mg/dL Final         Passed - Valid encounter within last 12 months    Recent Outpatient Visits          Today Type 2 diabetes mellitus with hypoglycemia without coma, without long-term current use of insulin [facility])   [place], Deborah B, MD   4 months ago Hospital discharge follow-up   [place], MD   4 months ago Generalized pruritus   [place], Zelda W, NP   7 months ago Preoperative evaluation to rule out surgical contraindication   [place], MD   8 months ago Pap smear for cervical cancer screening   [place], Deborah B, MD      Future Appointments            In 1 month [name], Daleen Bo, MD [place]   In 4 months [name], MD [place]           . valsartan-hydrochlorothiazide (DIOVAN-HCT) 160-12.5 MG tablet [Pharmacy Med Name: VALSARTAN-HYDROCHLOROTHIAZIDE 160-12.5 MG ORAL TABLET] 90 tablet 1    Sig: TAKE ONE TABLET BY MOUTH  ONCE DAILY (AM)     Cardiovascular: ARB + Diuretic Combos Failed - 09/01/2021  1:57 PM      Failed - Na in normal range and within 180 days    Sodium  Date Value  Ref Range Status  03/26/2021 132 (L) 135 - 145 mmol/L Final  01/20/2021 139 134 - 144 mmol/L Final         Failed - Cr in normal range and within 180 days    Creat  Date Value Ref Range Status  03/11/2016 0.90 0.50 - 1.05 mg/dL Final    Comment:      For patients > or = 62 years of age: The upper reference limit for Creatinine is approximately 13% higher for people identified as African-American.      Creatinine, Ser  Date Value Ref Range Status  03/26/2021 1.06 (H) 0.44 - 1.00 mg/dL Final   Creatinine, POC  Date Value Ref Range Status  06/10/2016 200 mg/dL Final         Failed - Last BP in normal range    BP Readings from Last 1 Encounters:  09/02/21 110/70         Passed - K in normal range and within 180 days    Potassium  Date Value Ref Range Status  03/26/2021 3.8 3.5 - 5.1 mmol/L Final    Comment:    SLIGHT HEMOLYSIS         Passed - eGFR is 10 or above and within 180 days    GFR, Est African American  Date Value Ref Range Status  03/11/2016 83 >=60 mL/min Final   GFR calc Af Amer  Date Value Ref Range Status  05/03/2019 >60 >60 mL/min Final   GFR, Est Non African American  Date Value Ref Range Status  03/11/2016 72 >=60 mL/min Final   GFR, Estimated  Date Value Ref Range Status  03/26/2021 60 (L) >60 mL/min Final    Comment:    (NOTE) Calculated using the CKD-EPI Creatinine Equation (2021)    eGFR  Date Value Ref Range Status  01/20/2021 69 >59 mL/min/1.73 Final         Passed - Patient is not pregnant      Passed - Valid encounter within last 6 months    Recent Outpatient Visits          Today Type 2 diabetes mellitus with hypoglycemia without coma, without long-term current use of insulin ([place])   [place], Deborah B, MD   4 months ago Hospital discharge follow-up   [place], Deborah B, MD   4 months ago Generalized pruritus   [place], Zelda W, NP   7 months ago Preoperative evaluation to rule out surgical contraindication   [place], MD   8 months ago Pap smear for cervical cancer screening   [place], Deborah B, MD      Future Appointments            In 1 month [name], Daleen Bo, MD [place]   In 4 months [name], MD [place]  . glimepiride (AMARYL) 2 MG tablet [Pharmacy Med Name: GLIMEPIRIDE 2 MG ORAL TABLET] 90 tablet 1    Sig: TAKE  1 TABLET (2 MG TOTAL) BY MOUTH DAILY. (AM)     Endocrinology:  Diabetes - Sulfonylureas Failed - 09/01/2021  1:57 PM      Failed - Cr in normal range and within 360 days    Creat  Date Value Ref Range Status  03/11/2016 0.90 0.50 - 1.05 mg/dL Final    Comment:      For patients > or = 62 years of age: The upper reference limit for Creatinine is approximately 13% higher for people identified as African-American.      Creatinine, Ser  Date Value Ref Range Status  03/26/2021 1.06 (H) 0.44 - 1.00 mg/dL Final   Creatinine, POC  Date Value Ref Range Status  06/10/2016 200 mg/dL Final         Passed - HBA1C is between 0 and 7.9 and within 180 days    HbA1c, POC (prediabetic range)  Date Value Ref Range Status  09/02/2021 6.3 5.7 - 6.4 % Final         Passed - Valid encounter within last 6 months    Recent Outpatient Visits          Today Type 2 diabetes mellitus with hypoglycemia without coma, without long-term current use of insulin [facility])   [place] [name], MD   4 months ago Hospital discharge follow-up   [place], MD   4 months ago Generalized pruritus   [place], Zelda W, NP   7 months ago Preoperative  evaluation to rule out surgical contraindication   [place], MD   8 months ago Pap smear for cervical cancer screening   [place], Deborah B, MD      Future Appointments            In 1 month [name], Daleen Bo, MD [place]   In 4 months [name], MD [place]

## 2021-09-02 NOTE — Telephone Encounter (Signed)
Requested medication (s) are due for refill today: unclear, historical provider rx missing details  Requested medication (s) are on the active medication list: yes  Last refill:  Hydroxine 08/15/21, [name], labs are from 2020 and pt stated 05/01/21 that she does not take it, Loratadine 08/15/21   Future visit scheduled: 01/11/23, seen today  Notes to clinic:  Hydroxine and Loratadine from historical provider, pt states does not take ferosul, however labs are out of date, 2020, please assess.      Requested Prescriptions  Pending Prescriptions Disp Refills   hydrOXYzine (ATARAX) 10 MG tablet [Pharmacy Med Name: HYDROXYZINE HCL 10 MG ORAL TABLET] 120 tablet     Sig: TAKE 1 TABLET IN THE MORNING AND NOON AND 2 TABLETS IN THE EVENING AS NEEDED.     Ear, Nose, and Throat:  Antihistamines 2 Failed - 09/01/2021  1:57 PM      Failed - Cr in normal range and within 360 days    Creat  Date Value Ref Range Status  03/11/2016 0.90 0.50 - 1.05 mg/dL Final    Comment:      For patients > or = 62 years of age: The upper reference limit for Creatinine is approximately 13% higher for people identified as African-American.      Creatinine, Ser  Date Value Ref Range Status  03/26/2021 1.06 (H) 0.44 - 1.00 mg/dL Final   Creatinine, POC  Date Value Ref Range Status  06/10/2016 200 mg/dL Final         Passed - Valid encounter within last 12 months    Recent Outpatient Visits           Today Type 2 diabetes mellitus with hypoglycemia without coma, without long-term current use of insulin ([place])   [place] [name], MD   4 months ago Hospital discharge follow-up   [place], Deborah B, MD   4 months ago Generalized pruritus   [place] Gildardo Pounds, NP   7 months ago Preoperative evaluation to rule out surgical contraindication   [place]  [name], MD   8 months ago Pap smear for cervical cancer screening   [place], Deborah B, MD       Future Appointments             In 1 month [name], Daleen Bo, MD [place]   In 4 months [name], MD [place] 325 (65 Fe) MG tablet [Pharmacy Med Name: [name] 325 (65 FE) MG ORAL TABLET] 100 tablet 0    Sig: TAKE ONE TABLET ( 325 MG ) BY MOUTH DAILY (AM)     Endocrinology:  Minerals - Iron Supplementation Failed - 09/01/2021  1:57 PM      Failed - Fe (serum) in normal range and within 360 days    Iron  Date Value Ref Range Status  10/14/2018 15 (L) 27 - 159 ug/dL Final   Iron Saturation  Date Value Ref Range Status  10/14/2018 3 (LL) 15 - 55 % Final         Failed - Ferritin in normal range and within 360 days    Ferritin  Date Value Ref Range Status  10/14/2018 19 15 - 150 ng/mL Final  Passed - HGB in normal range and within 360 days    Hemoglobin  Date Value Ref Range Status  05/01/2021 11.3 11.1 - 15.9 g/dL Final         Passed - HCT in normal range and within 360 days    Hematocrit  Date Value Ref Range Status  05/01/2021 34.7 34.0 - 46.6 % Final         Passed - RBC in normal range and within 360 days    RBC  Date Value Ref Range Status  05/01/2021 4.30 3.77 - 5.28 x10E6/uL Final  03/28/2021 3.25 (L) 3.87 - 5.11 MIL/uL Final         Passed - Valid encounter within last 12 months    Recent Outpatient Visits           Today Type 2 diabetes mellitus with hypoglycemia without coma, without long-term current use of insulin ([place])   [place] [name], MD   4 months ago Hospital discharge follow-up   [place], MD   4 months ago Generalized pruritus   [place], Zelda W, NP   7 months ago  Preoperative evaluation to rule out surgical contraindication   [place], MD   8 months ago Pap smear for cervical cancer screening   [place], MD       Future Appointments             In 1 month [name], Daleen Bo, MD [place]   In 4 months [name], MD [place]             loratadine (CLARITIN) 10 MG tablet [Pharmacy Med Name: LORATADINE 10 MG ORAL TABLET] 90 tablet     Sig: TAKE 1 TABLET (10 MG TOTAL) BY MOUTH DAILY. (AM)     Ear, Nose, and Throat:  Antihistamines 2 Failed - 09/01/2021  1:57 PM      Failed - Cr in normal range and within 360 days    Creat  Date Value Ref Range Status  03/11/2016 0.90 0.50 - 1.05 mg/dL Final    Comment:      For patients > or = 62 years of age: The upper reference limit for Creatinine is approximately 13% higher for people identified as African-American.      Creatinine, Ser  Date Value Ref Range Status  03/26/2021 1.06 (H) 0.44 - 1.00 mg/dL Final   Creatinine, POC  Date Value Ref Range Status  06/10/2016 200 mg/dL Final         Passed - Valid encounter within last 12 months    Recent Outpatient Visits           Today Type 2 diabetes mellitus with hypoglycemia without coma, without long-term current use of insulin [facility])   [place], Deborah B, MD   4 months ago Hospital discharge follow-up   [place], MD   4 months ago Generalized pruritus   [place], Zelda W, NP   7 months ago Preoperative evaluation to rule out surgical contraindication   [place] Karle Plumber B, MD   8 months ago Pap smear for cervical cancer screening  [place], MD        Future Appointments             In 1 month [name], Daleen Bo, MD [facility]   In 4 months [name], MD [place]            Signed Prescriptions Disp Refills   valsartan-hydrochlorothiazide (DIOVAN-HCT) 160-12.5 MG tablet 90 tablet 1    Sig: TAKE ONE TABLET BY MOUTH ONCE DAILY (AM)     Cardiovascular: ARB + Diuretic Combos Failed - 09/01/2021  1:57 PM      Failed - Na in normal range and within 180 days    Sodium  Date Value Ref Range Status  03/26/2021 132 (L) 135 - 145 mmol/L Final  01/20/2021 139 134 - 144 mmol/L Final         Failed - Cr in normal range and within 180 days    Creat  Date Value Ref Range Status  03/11/2016 0.90 0.50 - 1.05 mg/dL Final    Comment:      For patients > or = 62 years of age: The upper reference limit for Creatinine is approximately 13% higher for people identified as African-American.      Creatinine, Ser  Date Value Ref Range Status  03/26/2021 1.06 (H) 0.44 - 1.00 mg/dL Final   Creatinine, POC  Date Value Ref Range Status  06/10/2016 200 mg/dL Final         Failed - Last BP in normal range    BP Readings from Last 1 Encounters:  09/02/21 110/70         Passed - K in normal range and within 180 days    Potassium  Date Value Ref Range Status  03/26/2021 3.8 3.5 - 5.1 mmol/L Final    Comment:    SLIGHT HEMOLYSIS         Passed - eGFR is 10 or above and within 180 days    GFR, Est African American  Date Value Ref Range Status  03/11/2016 83 >=60 mL/min Final   GFR calc Af Amer  Date Value Ref Range Status  05/03/2019 >60 >60 mL/min Final   GFR, Est Non African American  Date Value Ref Range Status  03/11/2016 72 >=60 mL/min Final   GFR, Estimated  Date Value Ref Range Status  03/26/2021 60 (L) >60 mL/min Final    Comment:    (NOTE) Calculated using the CKD-EPI Creatinine Equation (2021)    eGFR  Date Value Ref Range Status  01/20/2021 69 >59  mL/min/1.73 Final         Passed - Patient is not pregnant      Passed - Valid encounter within last 6 months    Recent Outpatient Visits           Today Type 2 diabetes mellitus with hypoglycemia without coma, without long-term current use of insulin ([place])   [place], Deborah B, MD   4 months ago Hospital discharge follow-up   [place], Deborah B, MD   4 months ago Generalized pruritus   [place], Zelda W, NP   7 months ago Preoperative evaluation to rule out surgical contraindication   [place] Karle Plumber B, MD   8 months ago Pap smear for cervical cancer screening  [place], MD       Future Appointments             In 1 month [name], Daleen Bo, MD [facility]   In 4 months [name], MD [place]            Refused Prescriptions Disp Refills   glimepiride (AMARYL) 2 MG tablet [Pharmacy Med Name: GLIMEPIRIDE 2 MG ORAL TABLET] 90 tablet 1    Sig: TAKE 1 TABLET (2 MG TOTAL) BY MOUTH DAILY. (AM)     Endocrinology:  Diabetes - Sulfonylureas Failed - 09/01/2021  1:57 PM      Failed - Cr in normal range and within 360 days    Creat  Date Value Ref Range Status  03/11/2016 0.90 0.50 - 1.05 mg/dL Final    Comment:      For patients > or = 62 years of age: The upper reference limit for Creatinine is approximately 13% higher for people identified as African-American.      Creatinine, Ser  Date Value Ref Range Status  03/26/2021 1.06 (H) 0.44 - 1.00 mg/dL Final   Creatinine, POC  Date Value Ref Range Status  06/10/2016 200 mg/dL Final         Passed - HBA1C is between 0 and 7.9 and within 180 days    HbA1c, POC (prediabetic range)  Date Value Ref Range Status  09/02/2021 6.3 5.7 - 6.4 % Final          Passed - Valid encounter within last 6 months    Recent Outpatient Visits           Today Type 2 diabetes mellitus with hypoglycemia without coma, without long-term current use of insulin [facility])   [place] [name], MD   4 months ago Hospital discharge follow-up   [place], MD   4 months ago Generalized pruritus   [place], Zelda W, NP   7 months ago Preoperative evaluation to rule out surgical contraindication   [place], MD   8 months ago Pap smear for cervical cancer screening   [place], Deborah B, MD       Future Appointments             In 1 month [name], Daleen Bo, MD [place]   In 4 months [name], MD [place]

## 2021-09-02 NOTE — Progress Notes (Signed)
Patient ID: Stacy [name] [facility], female    DOB: 1960-05-08  MRN: [id number]  CC: Diabetes   Subjective: Stacy [name] is a 61 y.o. female who presents for chronic disease management.  Cleophas Dunker is with her. Her concerns today include:  Pt with hx of HTN, DM with neuropathy, aortic atherosclerosis, tob dep, HL, PAD, IDA, lupus, spinal stenosis with neurogenic claudication (s/p laminectomy 07/2018), COPD,OSA on CPAP,LS, OA knees, gout, recurrent sinusitis, receiving allergy shots from Dr. Faith Rogue. She does not have her medications with her today.  Getting better but still using her upright walker. Able to walk a little farther about 1/2 mile. No falls.  She shows me x-ray pictures of her back showing the screws/hardware in the lower back.  She has been following with Dr. [name].   DM: Results for orders placed or performed in visit on 09/02/21  HgB A1c  Result Value Ref Range   Hemoglobin A1C     HbA1c POC (<> result, manual entry)     HbA1c, POC (prediabetic range) 6.3 5.7 - 6.4 %   HbA1c, POC (controlled diabetic range)    Glucose (CBG)  Result Value Ref Range   POC Glucose 63 (A) 70 - 99 mg/dl  Glucose (CBG)  Result Value Ref Range   POC Glucose 79 70 - 99 mg/dl   *Note: Due to a large number of results and/or encounters for the requested time period, some results have not been displayed. A complete set of results can be found in Results Review.  BS low this a.m in office at 63.  Given a glucose tablet and recheck 30 minutes later was 79.  She was given 2 additional glucose tablets.  Sometimes cannot feel when blood sugar is below. Reports low BS all this wk in the 70.  Does not have log book with her.   Current medications are metformin XR 750 mg 1 tablet twice a day and Amaryl 2 mg daily Not much food in the house. Has 5 grand kids, husband and son but only she and her husband contribute to grocery bills.Marland Kitchen   HTN: taking but not as yet this a.m.  Reports compliance with  taking Diovan/HCTZ Limits salt   COPD:  compliant with Spiriva and Symbicort.  Using neb 3x/day.  No recent flares Quit tob 2 mths ago  OSA/CPAP - using CPAP about 5 days a wk. Forgets to put on some nights.  Feels she wakes refreshed when she uses it Patient Active Problem List   Diagnosis Date Noted   Spondylolisthesis, lumbar region    Other spondylosis with radiculopathy, lumbar region    Other secondary scoliosis, lumbar region    Fusion of spine of lumbar region 03/25/2021   Colon polyps 06/06/2020   Lupus ([place]) 06/06/2020   Allergic rhinitis due to animal (cat) (dog) hair and dander 04/08/2020   Allergic rhinitis due to pollen 04/08/2020   Food allergy 04/08/2020   Acute medial meniscus tear, left, subsequent encounter 03/21/2020   Chronic pain of left knee 02/15/2020   Paresthesia of skin 11/16/2019   History of total knee replacement, right 11/16/2019   Tobacco abuse 11/16/2019   Centrilobular emphysema ([place]) 08/10/2019   Incidental lung nodule, > 55m and < 884m07/09/2019   OSA on CPAP 08/10/2019   Dyspnea on exertion 08/02/2019   Hyperlipidemia 08/02/2019   Lumbar radiculopathy 04/24/2019   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   Post laminectomy syndrome 02/23/2019   Abnormality of gait  02/23/2019   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   Unilateral primary osteoarthritis, left hip 12/28/2018   Lesion of skin of left ear 12/26/2018   Primary osteoarthritis of left hip 12/02/2018   Iron deficiency anemia 10/16/2018   Chronic pain syndrome 09/08/2018   Chronic pain of right knee 08/11/2018   Status post lumbar laminectomy 07/15/2018   Peripheral arterial disease ([place]) 04/05/2018   Moderate persistent asthma without complication 17/00/1749   Environmental and seasonal allergies 06/29/2017   Controlled type 2 diabetes mellitus with diabetic polyneuropathy, without long-term current use of insulin ([place]) 06/29/2017   Perennial allergic rhinitis 04/08/2017    Sensorineural hearing loss (SNHL), bilateral 04/08/2017   Chronic pansinusitis 03/25/2017   Eustachian tube dysfunction, bilateral 44/96/7591   Lichen planopilaris 63/84/6659   Herniation of lumbar intervertebral disc with radiculopathy 10/02/2016    Class: Chronic   Alopecia areata 08/19/2016   Chondromalacia of both patellae 06/03/2015    Class: Chronic   Spinal stenosis, lumbar region, with neurogenic claudication 06/03/2015   Tobacco use disorder 04/25/2015   DJD (degenerative joint disease) of knee 01/04/2015   Hemorrhoid 11/14/2014   Gout of big toe 07/19/2014   Essential hypertension 08/14/2013   Gastroesophageal reflux disease without esophagitis 08/14/2013   COPD (chronic obstructive pulmonary disease) ([place]) 04/17/2011     Current Outpatient Medications on File Prior to Visit  Medication Sig Dispense Refill   Accu-Chek Softclix Lancets lancets USE AS INSTRUCTED 3 TIMES A DAY 100 each 12   albuterol (PROVENTIL) (2.5 MG/3ML) 0.083% nebulizer solution USE ONE VIAL (2.5 MG TOTAL) BY NEBULIZATION EVERY 6 (SIX) HOURS AS NEEDED FOR WHEEZING. 360 mL 0   allopurinol (ZYLOPRIM) 100 MG tablet TAKE 1 TABLET (100 MG TOTAL) BY MOUTH DAILY.(AM) 90 tablet 3   allopurinol (ZYLOPRIM) 100 MG tablet 1 tablet     atorvastatin (LIPITOR) 20 MG tablet      atorvastatin (LIPITOR) 40 MG tablet TAKE 1 TABLET (40 MG TOTAL) BY MOUTH DAILY. (BEDTIME) 30 tablet 4   bisacodyl (DULCOLAX) 5 MG EC tablet Take 2 tablets at 12:00 noon and 2 Tablets at 2:00 pm     Blood Glucose Monitoring Suppl (ACCU-CHEK AVIVA PLUS) w/Device KIT 1 each by Does not apply route 3 (three) times daily. 1 kit 0   budesonide-formoterol (SYMBICORT) 80-4.5 MCG/ACT inhaler INHALE TWO PUFFS BY MOUTH TWICE A DAY 1 Inhaler 11   celecoxib (CELEBREX) 200 MG capsule TAKE 1 CAPSULE (200 MG TOTAL) BY MOUTH 2 (TWO) TIMES DAILY. (AM+BEDTIME) 180 capsule 3   celecoxib (CELEBREX) 200 MG capsule 2 capsules     clopidogrel (PLAVIX) 75 MG tablet TAKE 1  TABLET (75 MG TOTAL) BY MOUTH DAILY. (AM) 30 tablet 2   clopidogrel (PLAVIX) 75 MG tablet Take 1 tablet by mouth daily.     diclofenac Sodium (VOLTAREN) 1 % GEL 1 application     dicyclomine (BENTYL) 10 MG capsule Take 10 mg by mouth in the morning, at noon, and at bedtime.     docusate sodium (COLACE) 100 MG capsule Take 1 capsule (100 mg total) by mouth 2 (two) times daily. 40 capsule 0   DULoxetine (CYMBALTA) 60 MG capsule TAKE 1 CAPSULE (60 MG TOTAL) BY MOUTH DAILY (AM) 30 capsule 6   EPINEPHrine 0.3 mg/0.3 mL IJ SOAJ injection 0.3 mg IM x 1 PRN for allergic reaction (Patient taking differently: Inject 0.3 mg into the muscle once as needed for anaphylaxis.) 1 Device 1   [name] 325 (65 Fe) MG tablet TAKE ONE TABLET (  325 MG ) BY MOUTH DAILY (AM) (Patient not taking: Reported on 05/01/2021) 100 tablet 0   ferrous gluconate (FERGON) 324 MG tablet Take 1 tablet (324 mg total) by mouth 2 (two) times daily with a meal. 60 tablet 1   FLOWFLEX COVID-19 AG HOME TEST KIT See admin instructions.     fluticasone (FLONASE) 50 MCG/ACT nasal spray Place 1 spray into both nostrils daily. 16 g 6   gabapentin (NEURONTIN) 600 MG tablet 1 capsule     glucose blood (ACCU-CHEK AVIVA PLUS) test strip Use as instructed 100 each 12   glycopyrrolate (ROBINUL) 2 MG tablet Take 2 mg by mouth 3 (three) times daily.     hydrochlorothiazide (HYDRODIURIL) 25 MG tablet 1 tablet in the morning     HYDROcodone-acetaminophen (NORCO/VICODIN) 5-325 MG tablet 1 tablet as needed     hydrOXYzine (ATARAX) 10 MG tablet 1 tablet     Lancets (ACCU-CHEK SOFT TOUCH) lancets Use as instructed 100 each 12   loperamide (IMODIUM) 2 MG capsule TAKE 1 CAPSULE (2 MG TOTAL) BY MOUTH 4 (FOUR) TIMES DAILY AS NEEDED FOR DIARRHEA OR LOOSE STOOLS. 15 capsule 0   loratadine (ALLERGY RELIEF) 10 MG tablet Take 1 tablet by mouth daily.     melatonin 5 MG TABS 1 tablet in the evening     meloxicam (MOBIC) 7.5 MG tablet 1 tablet     metaxalone (SKELAXIN)  800 MG tablet Take 1 tablet by mouth 3 (three) times daily.     metFORMIN (GLUCOPHAGE) 850 MG tablet 1 tablet with a meal     metFORMIN (GLUCOPHAGE-XR) 750 MG 24 hr tablet TAKE 1 TABLET (750 MG TOTAL) BY MOUTH 2 (TWO) TIMES DAILY (AM+BEDTIME) 180 tablet 0   methocarbamol (ROBAXIN) 500 MG tablet Take 1 tablet (500 mg total) by mouth every 8 (eight) hours as needed for muscle spasms. (Patient not taking: Reported on 05/01/2021) 30 tablet 1   methocarbamol (ROBAXIN) 750 MG tablet 1 tablet     metoCLOPramide (REGLAN) 5 MG tablet Take 5 mg by mouth 3 (three) times daily as needed for nausea or vomiting. Take 1 tablet by mouth three times a day before meals as needed for nausea and vomiting.     montelukast (SINGULAIR) 10 MG tablet Take 10 mg by mouth at bedtime.      montelukast (SINGULAIR) 10 MG tablet Take 1 tablet by mouth daily.     Multiple Vitamins-Minerals (CENTRUM SILVER 50+WOMEN PO) Take 1 tablet by mouth daily.     nicotine (NICODERM CQ - DOSED IN MG/24 HOURS) 14 mg/24hr patch PLACE 1 PATCH ONTO THE SKIN DAILY 28 patch 0   nicotine (NICODERM CQ - DOSED IN MG/24 HOURS) 14 mg/24hr patch 1 patch to skin     ofloxacin (OCUFLOX) 0.3 % ophthalmic solution Place 1 drop into the right eye 4 (four) times daily.     oxyCODONE (OXY IR/ROXICODONE) 5 MG immediate release tablet Take 1 tablet (5 mg total) by mouth every 6 (six) hours as needed for moderate pain ((score 4 to 6)). 40 tablet 0   oxyCODONE (OXYCONTIN) 15 mg 12 hr tablet Take 1 tablet (15 mg total) by mouth every 12 (twelve) hours. 14 tablet 0   pantoprazole (PROTONIX) 40 MG tablet Take 1 tablet (40 mg total) by mouth daily. 30 tablet 2   permethrin (ELIMITE) 5 % cream Apply to all areas of the body from the neck to soles of feet leave on for 8 to 14 hours before removing by washing (  shower or bath). 60 g 0   polyethylene glycol (MIRALAX / GLYCOLAX) 17 g packet Take 17 g by mouth daily as needed for moderate constipation.     potassium chloride SA  (KLOR-CON M) 20 MEQ tablet TAKE ONE TABLET BY MOUTH ONCE DAILY (NOON) 90 tablet 1   promethazine (PHENERGAN) 12.5 MG tablet Take 1 tablet (12.5 mg total) by mouth 2 (two) times daily as needed for nausea or vomiting. 20 tablet 0   Respiratory Therapy Supplies (FLUTTER) DEVI Use after breathing treatment 4 times daily 1 each 0   sucralfate (CARAFATE) 1 g tablet Take 1 g by mouth 4 (four) times daily -  with meals and at bedtime.     tiotropium (SPIRIVA HANDIHALER) 18 MCG inhalation capsule PLACE 1 CAPSULE (18 MCG TOTAL) INTO INHALER AND INHALE DAILY. 30 capsule 1   tiZANidine (ZANAFLEX) 2 MG tablet Take 2 mg by mouth 3 (three) times daily.     tobramycin-dexamethasone (TOBRADEX) ophthalmic ointment 1 application for L eye     traMADol (ULTRAM) 50 MG tablet 1 tablet as needed     triamcinolone cream (KENALOG) 0.1 % Apply 1 application topically 2 (two) times daily. 30 g 0   triamcinolone cream (KENALOG) 0.1 % 1 application     valsartan (DIOVAN) 160 MG tablet      valsartan-hydrochlorothiazide (DIOVAN-HCT) 160-12.5 MG tablet TAKE ONE TABLET BY MOUTH ONCE DAILY (AM) 90 tablet 1   [DISCONTINUED] Fluticasone-Salmeterol (ADVAIR) 500-50 MCG/DOSE AEPB Inhale 1 puff into the lungs every 12 (twelve) hours.       [DISCONTINUED] lisinopril (PRINIVIL,ZESTRIL) 40 MG tablet Take 40 mg by mouth daily.       No current facility-administered medications on file prior to visit.    Allergies  Allergen Reactions   Other Shortness Of Breath    UNSPECIFIED AGENTS Allergic to perfumes and cleaning products   Shellfish Allergy Anaphylaxis    Per allergy test.   Ace Inhibitors Cough and Other (See Comments)        Celecoxib      upset stomach  Pt is taking med    Aspirin Nausea Only    stomach upset     Social History   Socioeconomic History   Marital status: Married    Spouse name: Not on file   Number of children: 2   Years of education: Not on file   Highest education level: Not on file   Occupational History   Occupation: unemployed    Employer: UNEMPLOYED  Tobacco Use   Smoking status: Some Days    Packs/day: 0.25    Years: 32.00    Total pack years: 8.00    Types: Cigarettes   Smokeless tobacco: Never   Tobacco comments:    About 1-2 cigarettes a day  Vaping Use   Vaping Use: Never used  Substance and Sexual Activity   Alcohol use: No   Drug use: No   Sexual activity: Yes    Birth control/protection: Surgical, Post-menopausal    Comment: tubal ligation  Other Topics Concern   Not on file  Social History Narrative   Left Handed    Lives in a two story apartment.    Drinks Caffeine    Social Determinants of Health   Financial Resource Strain: Not on file  Food Insecurity: No Food Insecurity (03/13/2021)   Hunger Vital Sign    Worried About Running Out of Food in the Last Year: Never true    Ran Out of Food in  the Last Year: Never true  Transportation Needs: No Transportation Needs (03/13/2021)   PRAPARE - Hydrologist (Medical): No    Lack of Transportation (Non-Medical): No  Physical Activity: Not on file  Stress: Not on file  Social Connections: Not on file  Intimate Partner Violence: Not on file    Family History  Problem Relation Age of Onset   Hypertension Father    Cancer Father    Heart disease Mother    Asthma Son        had as a child   Heart disease Sister    Breast cancer Sister    Hypertension Brother     Past Surgical History:  Procedure Laterality Date   CHOLECYSTECTOMY     COLONOSCOPY     ENDOMETRIAL ABLATION  10/2010   HERNIA REPAIR     umbicial hernia   JOINT REPLACEMENT Left 03/14/2019   Dr. Ninfa Linden hip   KNEE ARTHROSCOPY Left    06/07/2017 Dr. Marlou Sa of [place] ARTHROSCOPY Left 03/21/2020   Procedure: LEFT KNEE ARTHROSCOPY WITH PARTIAL MEDIAL MENISCECTOMY;  Surgeon: Mcarthur Rossetti, MD;  Location: [place] [facility];  Service: Orthopedics;  Laterality: Left;    KNEE CLOSED REDUCTION Right 12/06/2015   Procedure: CLOSED MANIPULATION RIGHT KNEE;  Surgeon: [name], MD;  Location: [facility];  Service: Orthopedics;  Laterality: Right;   KNEE CLOSED REDUCTION Right 01/17/2016   Procedure: CLOSED MANIPULATION RIGHT KNEE;  Surgeon: [name], MD;  Location: [facility];  Service: Orthopedics;  Laterality: Right;   KNEE JOINT MANIPULATION Right 12/06/2015   LACRIMAL TUBE INSERTION Bilateral 03/01/2019   Procedure: LACRIMAL TUBE INSERTION;  Surgeon: Gevena Cotton, MD;  Location: [facility];  Service: Ophthalmology;  Laterality: Bilateral;   LACRIMAL TUBE REMOVAL Bilateral 05/03/2019   Procedure: BILATERAL NASOLACRIMAL DUCT PROBING, IIRIGATION AND TUBE REMOVAL BOTH EYES;  Surgeon: Gevena Cotton, MD;  Location: [place];  Service: Ophthalmology;  Laterality: Bilateral;   LUMBAR LAMINECTOMY/DECOMPRESSION MICRODISCECTOMY N/A 06/03/2015   Procedure: Bilateral lateral recess decompression L2-3, L3-4, L4-5;  Surgeon: [name], MD;  Location: [place];  Service: Orthopedics;  Laterality: N/A;   LUMBAR LAMINECTOMY/DECOMPRESSION MICRODISCECTOMY N/A 10/02/2016   Procedure: Right L5-S1 Lateral Recess Decompression  microdiscectomy;  Surgeon: [name], MD;  Location: [place];  Service: Orthopedics;  Laterality: N/A;   LUMBAR LAMINECTOMY/DECOMPRESSION MICRODISCECTOMY N/A 07/15/2018   Procedure: LEFT L3-4 MICRODISCECTOMY;  Surgeon: [name], MD;  Location: [place];  Service: Orthopedics;  Laterality: N/A;   svd      x 2   TEAR DUCT PROBING Bilateral 03/01/2019   Procedure: TEAR DUCT PROBING WITH IRRIGATION;  Surgeon: Gevena Cotton, MD;  Location: [facility];  Service: Ophthalmology;  Laterality: Bilateral;   TOTAL HIP ARTHROPLASTY Left 03/14/2019   Procedure: LEFT TOTAL HIP ARTHROPLASTY ANTERIOR APPROACH;  Surgeon: Mcarthur Rossetti, MD;  Location: [place];  Service: Orthopedics;  Laterality: Left;   TOTAL  KNEE ARTHROPLASTY Right 09/06/2015   Procedure: RIGHT TOTAL KNEE ARTHROPLASTY;  Surgeon: [name], MD;  Location: [place];  Service: Orthopedics;  Laterality: Right;   TUBAL LIGATION     UPPER GASTROINTESTINAL ENDOSCOPY  04/28/2011    ROS: Review of Systems Negative except as stated above  PHYSICAL EXAM: BP 110/70   Pulse 91   Temp 99.2 F (37.3 C) (Oral)   Resp 16   Ht 5' 6" (1.676 m)  Wt 156 lb (70.8 kg)   LMP 09/01/2010   SpO2 99%   BMI 25.18 kg/m   Wt Readings from Last 3 Encounters:  09/02/21 156 lb (70.8 kg)  07/23/21 154 lb (69.9 kg)  06/09/21 154 lb (69.9 kg)    Physical Exam  General appearance - alert, well appearing, older African-American female and in no distress.  She has her upright walker with her. Mental status - normal mood, behavior, speech, dress, motor activity, and thought processes Neck - supple, no significant adenopathy Chest - clear to auscultation, no wheezes, rales or rhonchi, symmetric air entry Heart - normal rate, regular rhythm, normal S1, S2, no murmurs, rubs, clicks or gallops Extremities - peripheral pulses normal, no pedal edema, no clubbing or cyanosis      Latest Ref Rng & Units 03/26/2021    3:11 PM 03/20/2021    8:43 AM 01/20/2021    3:44 PM  CMP  Glucose 70 - 99 mg/dL 162  50  56   BUN 8 - 23 mg/dL _0 Creatinine 0.44 - 1.00 mg/dL 1.06  0.86  0.94   Sodium 135 - 145 mmol/L 132  137  139   Potassium 3.5 - 5.1 mmol/L 3.8  4.6  4.2   Chloride 98 - 111 mmol/L 95  99  102   CO2 22 - 32 mmol/L _1 Calcium 8.9 - 10.3 mg/dL 8.7  10.0  9.8   Total Protein 6.0 - 8.5 g/dL   7.1   Total Bilirubin 0.0 - 1.2 mg/dL   <0.2   Alkaline Phos 44 - 121 IU/L   113   AST 0 - 40 IU/L   16   ALT 0 - 32 IU/L   13    Lipid Panel     Component Value Date/Time   CHOL 160 01/20/2021 1544   TRIG 91 01/20/2021 1544   HDL 49 01/20/2021 1544   CHOLHDL 3.3 01/20/2021 1544   CHOLHDL 4.2 07/19/2014 1202   VLDL 24 07/19/2014 1202    LDLCALC 94 01/20/2021 1544    CBC    Component Value Date/Time   WBC 7.1 05/01/2021 1526   WBC 7.8 03/28/2021 1116   RBC 4.30 05/01/2021 1526   RBC 3.25 (L) 03/28/2021 1116   HGB 11.3 05/01/2021 1526   HCT 34.7 05/01/2021 1526   PLT 297 05/01/2021 1526   MCV 81 05/01/2021 1526   MCH 26.3 (L) 05/01/2021 1526   MCH 26.2 03/28/2021 1116   MCHC 32.6 05/01/2021 1526   MCHC 33.2 03/28/2021 1116   RDW 15.7 (H) 05/01/2021 1526   LYMPHSABS 1.8 03/28/2021 1116   LYMPHSABS 4.1 (H) 10/14/2018 1704   MONOABS 0.7 03/28/2021 1116   EOSABS 0.1 03/28/2021 1116   EOSABS 0.1 10/14/2018 1704   BASOSABS 0.0 03/28/2021 1116   BASOSABS 0.0 10/14/2018 1704    ASSESSMENT AND PLAN: 1. Type 2 diabetes mellitus with hypoglycemia without coma, without long-term current use of insulin ([place]) Patient with some hypoglycemic unawareness. Stop Amaryl for now. Continue metformin and monitor blood sugars daily. Would benefit from having a glucagon pen at home.  Prescription will be sent to the pharmacy. - HgB A1c - Glucose (CBG) - Glucose (CBG) - glucose chewable tablet 12 g  2. Essential hypertension At goal.  Continue Diovan/HCTZ.  3. OSA on CPAP Encouraged her to use the CPAP every night.  She reports benefit from using the equipment.  4. Centrilobular  emphysema (H[facility]) Stable on Symbicort and Spiriva.  5. Former smoker Commended home on quitting.  Encouraged her to remain tobacco free.  6. Need for shingles vaccine Patient agreeable to receiving the first shot of the Shingrix vaccine series today.  Advised that the vaccine can cause some swelling and redness at the injection site.  Please bring medications on next visit for medication reconciliation.  Patient was given the opportunity to ask questions.  Patient verbalized understanding of the plan and was able to repeat key elements of the plan.   This documentation was completed using Radio producer.  Any  transcriptional errors are unintentional.  Orders Placed This Encounter  Procedures   Varicella-zoster vaccine IM   HgB A1c   Glucose (CBG)   Glucose (CBG)     Requested Prescriptions    No prescriptions requested or ordered in this encounter    Return in about 4 months (around 01/02/2022).  Karle Plumber, MD, FACP

## 2021-09-02 NOTE — Telephone Encounter (Signed)
Other 3 meds to office. Requested Prescriptions  Pending Prescriptions Disp Refills  . hydrOXYzine (ATARAX) 10 MG tablet [Pharmacy Med Name: HYDROXYZINE HCL 10 MG ORAL TABLET] 120 tablet     Sig: TAKE 1 TABLET IN THE MORNING AND NOON AND 2 TABLETS IN THE EVENING AS NEEDED.     Ear, Nose, and Throat:  Antihistamines 2 Failed - 09/01/2021  1:57 PM      Failed - Cr in normal range and within 360 days    Creat  Date Value Ref Range Status  03/11/2016 0.90 0.50 - 1.05 mg/dL Final    Comment:      For patients > or = 62 years of age: The upper reference limit for Creatinine is approximately 13% higher for people identified as African-American.      Creatinine, Ser  Date Value Ref Range Status  03/26/2021 1.06 (H) 0.44 - 1.00 mg/dL Final   Creatinine, POC  Date Value Ref Range Status  06/10/2016 200 mg/dL Final         Passed - Valid encounter within last 12 months    Recent Outpatient Visits          Today Type 2 diabetes mellitus with hypoglycemia without coma, without long-term current use of insulin [facility])   [place], Deborah B, MD   4 months ago Hospital discharge follow-up   [place], MD   4 months ago Generalized pruritus   [place], Zelda W, NP   7 months ago Preoperative evaluation to rule out surgical contraindication   [place], MD   8 months ago Pap smear for cervical cancer screening   [place], Deborah B, MD      Future Appointments            In 1 month [name], Daleen Bo, MD [place]   In 4 months [name], MD [place]           . [name] 325 (65 Fe) MG tablet [Pharmacy Med Name: [name] 325 (65 FE) MG ORAL TABLET] 100 tablet 0    Sig: TAKE ONE TABLET ( 325 MG ) BY MOUTH DAILY  (AM)     Endocrinology:  Minerals - Iron Supplementation Failed - 09/01/2021  1:57 PM      Failed - Fe (serum) in normal range and within 360 days    Iron  Date Value Ref Range Status  10/14/2018 15 (L) 27 - 159 ug/dL Final   Iron Saturation  Date Value Ref Range Status  10/14/2018 3 (LL) 15 - 55 % Final         Failed - Ferritin in normal range and within 360 days    Ferritin  Date Value Ref Range Status  10/14/2018 19 15 - 150 ng/mL Final         Passed - HGB in normal range and within 360 days    Hemoglobin  Date Value Ref Range Status  05/01/2021 11.3 11.1 - 15.9 g/dL Final         Passed - HCT in normal range and within 360 days    Hematocrit  Date Value Ref Range Status  05/01/2021 34.7 34.0 - 46.6 % Final         Passed - RBC in normal range and within  360 days    RBC  Date Value Ref Range Status  05/01/2021 4.30 3.77 - 5.28 x10E6/uL Final  03/28/2021 3.25 (L) 3.87 - 5.11 MIL/uL Final         Passed - Valid encounter within last 12 months    Recent Outpatient Visits          Today Type 2 diabetes mellitus with hypoglycemia without coma, without long-term current use of insulin [facility])   [place] [name], MD   4 months ago Hospital discharge follow-up   [place], MD   4 months ago Generalized pruritus   [place], Zelda W, NP   7 months ago Preoperative evaluation to rule out surgical contraindication   [place], MD   8 months ago Pap smear for cervical cancer screening   [place], Deborah B, MD      Future Appointments            In 1 month [name], Daleen Bo, MD [place]   In 4 months [name], MD [place]           . loratadine (CLARITIN) 10 MG tablet [Pharmacy Med  Name: LORATADINE 10 MG ORAL TABLET] 90 tablet     Sig: TAKE 1 TABLET (10 MG TOTAL) BY MOUTH DAILY. (AM)     Ear, Nose, and Throat:  Antihistamines 2 Failed - 09/01/2021  1:57 PM      Failed - Cr in normal range and within 360 days    Creat  Date Value Ref Range Status  03/11/2016 0.90 0.50 - 1.05 mg/dL Final    Comment:      For patients > or = 62 years of age: The upper reference limit for Creatinine is approximately 13% higher for people identified as African-American.      Creatinine, Ser  Date Value Ref Range Status  03/26/2021 1.06 (H) 0.44 - 1.00 mg/dL Final   Creatinine, POC  Date Value Ref Range Status  06/10/2016 200 mg/dL Final         Passed - Valid encounter within last 12 months    Recent Outpatient Visits          Today Type 2 diabetes mellitus with hypoglycemia without coma, without long-term current use of insulin [facility])   [place], Deborah B, MD   4 months ago Hospital discharge follow-up   [place], MD   4 months ago Generalized pruritus   [place], Zelda W, NP   7 months ago Preoperative evaluation to rule out surgical contraindication   [place], MD   8 months ago Pap smear for cervical cancer screening   [place], Deborah B, MD      Future Appointments            In 1 month [name], Daleen Bo, MD [place]   In 4 months [name], MD [place]           . valsartan-hydrochlorothiazide (DIOVAN-HCT) 160-12.5 MG tablet [Pharmacy Med Name: VALSARTAN-HYDROCHLOROTHIAZIDE 160-12.5 MG ORAL TABLET] 90 tablet 1    Sig:  TAKE ONE TABLET BY MOUTH ONCE DAILY (AM)     Cardiovascular: ARB + Diuretic Combos Failed - 09/01/2021  1:57 PM      Failed - Na in normal range and within 180  days    Sodium  Date Value Ref Range Status  03/26/2021 132 (L) 135 - 145 mmol/L Final  01/20/2021 139 134 - 144 mmol/L Final         Failed - Cr in normal range and within 180 days    Creat  Date Value Ref Range Status  03/11/2016 0.90 0.50 - 1.05 mg/dL Final    Comment:      For patients > or = 62 years of age: The upper reference limit for Creatinine is approximately 13% higher for people identified as African-American.      Creatinine, Ser  Date Value Ref Range Status  03/26/2021 1.06 (H) 0.44 - 1.00 mg/dL Final   Creatinine, POC  Date Value Ref Range Status  06/10/2016 200 mg/dL Final         Failed - Last BP in normal range    BP Readings from Last 1 Encounters:  09/02/21 110/70         Passed - K in normal range and within 180 days    Potassium  Date Value Ref Range Status  03/26/2021 3.8 3.5 - 5.1 mmol/L Final    Comment:    SLIGHT HEMOLYSIS         Passed - eGFR is 10 or above and within 180 days    GFR, Est African American  Date Value Ref Range Status  03/11/2016 83 >=60 mL/min Final   GFR calc Af Amer  Date Value Ref Range Status  05/03/2019 >60 >60 mL/min Final   GFR, Est Non African American  Date Value Ref Range Status  03/11/2016 72 >=60 mL/min Final   GFR, Estimated  Date Value Ref Range Status  03/26/2021 60 (L) >60 mL/min Final    Comment:    (NOTE) Calculated using the CKD-EPI Creatinine Equation (2021)    eGFR  Date Value Ref Range Status  01/20/2021 69 >59 mL/min/1.73 Final         Passed - Patient is not pregnant      Passed - Valid encounter within last 6 months    Recent Outpatient Visits          Today Type 2 diabetes mellitus with hypoglycemia without coma, without long-term current use of insulin ([place])   [place], Deborah B, MD   4 months ago Hospital discharge follow-up   [place], Deborah B, MD   4 months ago Generalized pruritus    [place], Zelda W, NP   7 months ago Preoperative evaluation to rule out surgical contraindication   [place], MD   8 months ago Pap smear for cervical cancer screening   [place], Deborah B, MD      Future Appointments            In 1 month [name], Daleen Bo, MD [place]   In 4 months [name], MD [place]  . glimepiride (AMARYL) 2 MG tablet [Pharmacy Med Name: GLIMEPIRIDE 2 MG ORAL TABLET] 90 tablet 1  Sig: TAKE 1 TABLET (2 MG TOTAL) BY MOUTH DAILY. (AM)     Endocrinology:  Diabetes - Sulfonylureas Failed - 09/01/2021  1:57 PM      Failed - Cr in normal range and within 360 days    Creat  Date Value Ref Range Status  03/11/2016 0.90 0.50 - 1.05 mg/dL Final    Comment:      For patients > or = 62 years of age: The upper reference limit for Creatinine is approximately 13% higher for people identified as African-American.      Creatinine, Ser  Date Value Ref Range Status  03/26/2021 1.06 (H) 0.44 - 1.00 mg/dL Final   Creatinine, POC  Date Value Ref Range Status  06/10/2016 200 mg/dL Final         Passed - HBA1C is between 0 and 7.9 and within 180 days    HbA1c, POC (prediabetic range)  Date Value Ref Range Status  09/02/2021 6.3 5.7 - 6.4 % Final         Passed - Valid encounter within last 6 months    Recent Outpatient Visits          Today Type 2 diabetes mellitus with hypoglycemia without coma, without long-term current use of insulin [facility])   [place] [name], MD   4 months ago Hospital discharge follow-up   [place], MD   4 months ago Generalized pruritus   [place], Zelda W, NP   7  months ago Preoperative evaluation to rule out surgical contraindication   [place], MD   8 months ago Pap smear for cervical cancer screening   [place], Deborah B, MD      Future Appointments            In 1 month [name], Daleen Bo, MD [place]   In 4 months [name], MD [place]

## 2021-09-02 NOTE — Patient Instructions (Addendum)
Hold glimepiride for now due to your blood sugars running low.  Continue to monitor blood sugars.  I will send a glucagon kit to your pharmacy for you or family member to use as needed in emergencies if you pass out due to low blood sugars.  Purchase glucose tabs over-the-counter and keep them in your purse to use as needed when blood sugar is low.  You can try going to a food bank as needed to obtain additional foods to put in your cupboard.

## 2021-09-03 DIAGNOSIS — F331 Major depressive disorder, recurrent, moderate: Secondary | ICD-10-CM | POA: Diagnosis not present

## 2021-09-09 ENCOUNTER — Other Ambulatory Visit: Payer: Self-pay | Admitting: Specialist

## 2021-09-09 NOTE — Telephone Encounter (Signed)
Pt called requesting a refill of muscle relaxer. Please send to pharmacy on file. Pt phone number is [phone].

## 2021-09-10 ENCOUNTER — Telehealth: Payer: Self-pay | Admitting: Specialist

## 2021-09-10 DIAGNOSIS — F331 Major depressive disorder, recurrent, moderate: Secondary | ICD-10-CM | POA: Diagnosis not present

## 2021-09-10 MED ORDER — METHOCARBAMOL 750 MG PO TABS: ORAL_TABLET | ORAL | 1 refills | Status: DC

## 2021-09-10 NOTE — Telephone Encounter (Signed)
Alecia Lemming (pharmacist) called requesting a call back for there is no instructions for prescription sent in for methocarbamol. Please call [facility] at [phone].

## 2021-09-12 ENCOUNTER — Telehealth: Payer: Self-pay | Admitting: Internal Medicine

## 2021-09-12 NOTE — Telephone Encounter (Signed)
..   Medicaid Managed Care   Unsuccessful Outreach Note  09/12/2021 Name: Stacy [name] [facility] MRN: [id number] DOB: Jun 18, 1960  Referred by: Marcine Matar, MD Reason for referral : High Risk Managed Medicaid (I called the patient today to get her scheduled with the MM Team. I left my name and number on her VM.)   An unsuccessful telephone outreach was attempted today. The patient was referred to the case management team for assistance with care management and care coordination.   Follow Up Plan: The care management team will reach out to the patient again over the next 14 days.    Weston Settle Care Guide, High Risk Medicaid Managed Care Embedded Care Coordination [facility]  [facility]    SIGNATURE

## 2021-09-17 DIAGNOSIS — F331 Major depressive disorder, recurrent, moderate: Secondary | ICD-10-CM | POA: Diagnosis not present

## 2021-09-26 ENCOUNTER — Other Ambulatory Visit: Payer: Self-pay | Admitting: Licensed Clinical Social Worker

## 2021-09-26 NOTE — Patient Instructions (Signed)
Visit Information  Stacy [name] was given information about Medicaid Managed Care team care coordination services and verbally consented to engagement with the [facility] Managed Care team.   Dickie La, BSW, MSW, LCSW Managed Medicaid LCSW [facility]  [facility] [email or web address] Phone: [phone]

## 2021-09-26 NOTE — Patient Outreach (Signed)
Medicaid Managed Care Social Work Note  09/26/2021 Name:  Stacy [name] [facility] MRN:  [id number] DOB:  04/08/1960  Stacy [name] is an 61 y.o. year old female who is a primary patient of [name], MD.  The [facility] Managed Care Coordination team was consulted for assistance with:  Disease Management and care coordination needs  Ms. Falck was given information about Medicaid Managed Care Coordination team services today. Stacy [name] Patient agreed to services and verbal consent obtained.  Engaged with patient  for by telephone forinitial visit in response to referral for case management and/or care coordination services.   Assessments/Interventions:  Review of past medical history, allergies, medications, health status, including review of consultants reports, laboratory and other test data, was performed as part of comprehensive evaluation and provision of chronic care management services.  SDOH: (Social Determinant of Health) assessments and interventions performed:   Advanced Directives Status:  Not addressed in this encounter.  Care Plan                 Allergies  Allergen Reactions   Other Shortness Of Breath    UNSPECIFIED AGENTS Allergic to perfumes and cleaning products   Shellfish Allergy Anaphylaxis    Per allergy test.   Ace Inhibitors Cough and Other (See Comments)        Celecoxib      upset stomach  Pt is taking med    Aspirin Nausea Only    stomach upset     Medications Reviewed Today     Reviewed by Marzetta Board, DPM (Physician) on 08/15/21 at 0930  Med List Status: <None>   Medication Order Taking? Sig Documenting Provider Last Dose Status Informant  Accu-Chek Softclix Lancets lancets [id number]  USE AS INSTRUCTED 3 TIMES A DAY [name], MD  Active Self  albuterol (PROVENTIL) (2.5 MG/3ML) 0.083% nebulizer solution [id number]  USE ONE VIAL (2.5 MG TOTAL) BY NEBULIZATION EVERY 6 (SIX) HOURS AS NEEDED FOR  WHEEZING. [name], MD  Active   allopurinol (ZYLOPRIM) 100 MG tablet [id number]  TAKE 1 TABLET (100 MG TOTAL) BY MOUTH DAILY.(AM) [name], PA-C  Active Self  allopurinol (ZYLOPRIM) 100 MG tablet [id number]  1 tablet [provider]  Active   atorvastatin (LIPITOR) 20 MG tablet [id number]   [provider]  Active   atorvastatin (LIPITOR) 40 MG tablet [id number]  TAKE 1 TABLET (40 MG TOTAL) BY MOUTH DAILY. (BEDTIME) [name], MD  Active   bisacodyl (DULCOLAX) 5 MG EC tablet [id number] Yes Take 2 tablets at 12:00 noon and 2 Tablets at 2:00 pm [provider]  Active   Blood Glucose Monitoring Suppl (ACCU-CHEK AVIVA PLUS) w/Device KIT [id number]  1 each by Does not apply route 3 (three) times daily. Argentina Donovan, [place]  Active Self  budesonide-formoterol (SYMBICORT) 80-4.5 MCG/ACT inhaler [id number]  INHALE TWO PUFFS BY MOUTH TWICE A DAY [name], MD  Active Self  celecoxib (CELEBREX) 200 MG capsule [id number]  TAKE 1 CAPSULE (200 MG TOTAL) BY MOUTH 2 (TWO) TIMES DAILY. (AM+BEDTIME) [name], MD  Active Self  celecoxib (CELEBREX) 200 MG capsule [id number]  2 capsules [provider]  Active   clopidogrel (PLAVIX) 75 MG tablet [id number]  TAKE 1 TABLET (75 MG TOTAL) BY MOUTH DAILY. (AM) [name], MD  Active   clopidogrel (PLAVIX) 75 MG tablet [id number]  Take 1 tablet by mouth daily. [provider]  Active   diclofenac Sodium (  VOLTAREN) 1 % GEL [id number]  1 application [provider]  Active   dicyclomine (BENTYL) 10 MG capsule [id number]  Take 10 mg by mouth in the morning, at noon, and at bedtime. [provider]  Active Self  docusate sodium (COLACE) 100 MG capsule [id number]  Take 1 capsule (100 mg total) by mouth 2 (two) times daily. [name], MD  Active   DULoxetine (CYMBALTA) 60 MG capsule [id number]  1 capsule [provider]  Active   EPINEPHrine 0.3 mg/0.3 mL IJ SOAJ  injection [id number]  0.3 mg IM x 1 PRN for allergic reaction  Patient taking differently: Inject 0.3 mg into the muscle once as needed for anaphylaxis.   [name], MD  Active Self  [name] 325 (65 Fe) MG tablet [id number]  TAKE ONE TABLET ( 325 MG ) BY MOUTH DAILY (AM)  Patient not taking: Reported on 05/01/2021   [name], MD  Active   ferrous gluconate (FERGON) 324 MG tablet [id number]  Take 1 tablet (324 mg total) by mouth 2 (two) times daily with a meal. [name], MD  Active   FLOWFLEX COVID-19 AG HOME TEST KIT [id number]  See admin instructions. [provider]  Active Self  fluticasone (FLONASE) 50 MCG/ACT nasal spray [id number]  Place 1 spray into both nostrils daily. [name], MD  Active Self    Discontinued 04/16/11 0943 gabapentin (NEURONTIN) 600 MG tablet [id number]  1 capsule [provider]  Active   glimepiride (AMARYL) 2 MG tablet [id number]  TAKE 1 TABLET (2 MG TOTAL) BY MOUTH DAILY. (AM) [name], MD  Active Self  glucose blood (ACCU-CHEK AVIVA PLUS) test strip [id number]  Use as instructed [name], MD  Active   glycopyrrolate (ROBINUL) 2 MG tablet [id number]  Take 2 mg by mouth 3 (three) times daily. [provider]  Active   hydrochlorothiazide (HYDRODIURIL) 25 MG tablet [id number]  1 tablet in the morning [provider]  Active   HYDROcodone-acetaminophen (NORCO/VICODIN) 5-325 MG tablet [id number]  1 tablet as needed [provider]  Active   hydrOXYzine (ATARAX) 10 MG tablet [id number]  1 tablet [provider]  Active   Lancets (ACCU-CHEK SOFT TOUCH) lancets [id number]  Use as instructed Argentina Donovan, PA-C  Active Self    Discontinued 04/16/11 0916 (Error) loperamide (IMODIUM) 2 MG capsule [id number]  TAKE 1 CAPSULE (2 MG TOTAL) BY MOUTH 4 (FOUR) TIMES DAILY AS NEEDED FOR DIARRHEA OR LOOSE STOOLS. [name], MD  Active Self  loratadine (ALLERGY RELIEF) 10 MG tablet  [id number]  Take 1 tablet by mouth daily. [provider]  Active   melatonin 5 MG TABS [id number]  1 tablet in the evening [provider]  Active   meloxicam (MOBIC) 7.5 MG tablet [id number]  1 tablet [provider]  Active   metaxalone (SKELAXIN) 800 MG tablet [id number]  Take 1 tablet by mouth 3 (three) times daily. [provider]  Active   metFORMIN (GLUCOPHAGE) 850 MG tablet [id number]  1 tablet with a meal [provider]  Active   metFORMIN (GLUCOPHAGE-XR) 750 MG 24 hr tablet [id number]  TAKE 1 TABLET (750 MG TOTAL) BY MOUTH 2 (TWO) TIMES DAILY (AM+BEDTIME) [name], MD  Active   methocarbamol (ROBAXIN) 500 MG tablet [id number]  Take 1 tablet (500 mg total) by mouth every 8 (eight) hours as needed for muscle spasms.  Patient not taking: Reported on 05/01/2021   [place],  Daleen Bo, MD  Active   methocarbamol (ROBAXIN) 750 MG tablet [id number]  1 tablet [provider]  Active   metoCLOPramide (REGLAN) 5 MG tablet [id number]  Take 5 mg by mouth 3 (three) times daily as needed for nausea or vomiting. Take 1 tablet by mouth three times a day before meals as needed for nausea and vomiting. [provider]  Active Self  montelukast (SINGULAIR) 10 MG tablet [id number]  Take 10 mg by mouth at bedtime.  [provider]  Active Self  montelukast (SINGULAIR) 10 MG tablet [id number]  Take 1 tablet by mouth daily. [provider]  Active   Multiple Vitamins-Minerals (CENTRUM SILVER 50+WOMEN PO) [id number]  Take 1 tablet by mouth daily. [provider]  Active Self  nicotine (NICODERM CQ - DOSED IN MG/24 HOURS) 14 mg/24hr patch [id number]  PLACE 1 PATCH ONTO THE SKIN DAILY [name], MD  Active   nicotine (NICODERM CQ - DOSED IN MG/24 HOURS) 14 mg/24hr patch [id number]  1 patch to skin [provider]  Active   ofloxacin (OCUFLOX) 0.3 % ophthalmic solution [id number]  Place 1 drop into the right eye 4 (four)  times daily. [provider]  Active Self  oxyCODONE (OXY IR/ROXICODONE) 5 MG immediate release tablet [id number]  Take 1 tablet (5 mg total) by mouth every 6 (six) hours as needed for moderate pain ((score 4 to 6)). [name], PA-C  Active   oxyCODONE (OXYCONTIN) 15 mg 12 hr tablet [id number]  Take 1 tablet (15 mg total) by mouth every 12 (twelve) hours. [name], PA-C  Active   pantoprazole (PROTONIX) 40 MG tablet [id number]  Take 1 tablet (40 mg total) by mouth daily. [name], MD  Active Self  permethrin (ELIMITE) 5 % cream [id number]  Apply to all areas of the body from the neck to soles of feet leave on for 8 to 14 hours before removing by washing (shower or bath). Gildardo Pounds, NP  Active   polyethylene glycol (MIRALAX / GLYCOLAX) 17 g packet [id number]  Take 17 g by mouth daily as needed for moderate constipation. [provider]  Active Self  potassium chloride SA (KLOR-CON M) 20 MEQ tablet [id number]  TAKE ONE TABLET BY MOUTH ONCE DAILY (NOON) [name], MD  Active   promethazine (PHENERGAN) 12.5 MG tablet [id number]  Take 1 tablet (12.5 mg total) by mouth 2 (two) times daily as needed for nausea or vomiting. [name], MD  Active   Respiratory Therapy Supplies (FLUTTER) [place] [id number]  Use after breathing treatment 4 times daily Elsie Stain, MD  Active Self  sucralfate (CARAFATE) 1 g tablet [id number]  Take 1 g by mouth 4 (four) times daily -  with meals and at bedtime. [provider]  Active Self  tiotropium (SPIRIVA HANDIHALER) 18 MCG inhalation capsule [id number]  PLACE 1 CAPSULE (18 MCG TOTAL) INTO INHALER AND INHALE DAILY. [name], MD  Active   tiZANidine (ZANAFLEX) 2 MG tablet [id number]  Take 2 mg by mouth 3 (three) times daily. [provider]  Active   tobramycin-dexamethasone [facility]) ophthalmic ointment [id number]  1 application for L eye [provider]  Active   traMADol (ULTRAM) 50  MG tablet [id number]  1 tablet as needed [provider]  Active   triamcinolone cream (KENALOG) 0.1 % [id number]  Apply 1 application topically 2 (two) times daily. [place], Cari S, PA-C  Active Self  triamcinolone cream (KENALOG) 0.1 %  [id number]  1 application [provider]  Active   valsartan (DIOVAN) 160 MG tablet [id number]   [provider]  Active   valsartan-hydrochlorothiazide (DIOVAN-HCT) 160-12.5 MG tablet [id number]  TAKE ONE TABLET BY MOUTH ONCE DAILY (AM) [name], MD  Active Self            Patient Active Problem List   Diagnosis Date Noted   Spondylolisthesis, lumbar region    Other spondylosis with radiculopathy, lumbar region    Other secondary scoliosis, lumbar region    Fusion of spine of lumbar region 03/25/2021   Colon polyps 06/06/2020   Lupus ([place]) 06/06/2020   Allergic rhinitis due to animal (cat) (dog) hair and dander 04/08/2020   Allergic rhinitis due to pollen 04/08/2020   Food allergy 04/08/2020   Acute medial meniscus tear, left, subsequent encounter 03/21/2020   Chronic pain of left knee 02/15/2020   Paresthesia of skin 11/16/2019   History of total knee replacement, right 11/16/2019   Tobacco abuse 11/16/2019   Centrilobular emphysema ([place]) 08/10/2019   Incidental lung nodule, > 23m and < 866m07/09/2019   OSA on CPAP 08/10/2019   Dyspnea on exertion 08/02/2019   Hyperlipidemia 08/02/2019   Lumbar radiculopathy 04/24/2019   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   Post laminectomy syndrome 02/23/2019   Abnormality of gait 02/23/2019   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   Unilateral primary osteoarthritis, left hip 12/28/2018   Lesion of skin of left ear 12/26/2018   Primary osteoarthritis of left hip 12/02/2018   Iron deficiency anemia 10/16/2018   Chronic pain syndrome 09/08/2018   Chronic pain of right knee 08/11/2018   Status post lumbar laminectomy 07/15/2018   Peripheral arterial disease ([place] 04/05/2018   Moderate persistent asthma without complication 0576/16/0737 Environmental and seasonal allergies 06/29/2017   Controlled type 2 diabetes mellitus with diabetic polyneuropathy, without long-term current use of insulin (HC[place]05/28/2019   Perennial allergic rhinitis 04/08/2017   Sensorineural hearing loss (SNHL), bilateral 04/08/2017   Chronic pansinusitis 03/25/2017   Eustachian tube dysfunction, bilateral 0210/62/6948 Lichen planopilaris 0954/62/7035 Herniation of lumbar intervertebral disc with radiculopathy 10/02/2016    Class: Chronic   Alopecia areata 08/19/2016   Chondromalacia of both patellae 06/03/2015    Class: Chronic   Spinal stenosis, lumbar region, with neurogenic claudication 06/03/2015   Tobacco use disorder 04/25/2015   DJD (degenerative joint disease) of knee 01/04/2015   Hemorrhoid 11/14/2014   Gout of big toe 07/19/2014   Essential hypertension 08/14/2013   Gastroesophageal reflux disease without esophagitis 08/14/2013   COPD (chronic obstructive pulmonary disease) (HCCrescent Mills03/15/2013    Conditions to be addressed/monitored per PCP order:  HTN, COPD, and DMII  There are no care plans that you recently modified to display for this patient.   Follow up:  Patient agrees to Care Plan and Follow-up.  Plan: The Managed Medicaid care management team will reach out to the patient again over the next 45 days.  BrEula FriedBSW, MSW, LCCHS Incanaged Medicaid LCSW [email or web address] Phone: [phone]

## 2021-10-01 DIAGNOSIS — F331 Major depressive disorder, recurrent, moderate: Secondary | ICD-10-CM | POA: Diagnosis not present

## 2021-10-04 ENCOUNTER — Other Ambulatory Visit: Payer: Self-pay | Admitting: Internal Medicine

## 2021-10-04 ENCOUNTER — Other Ambulatory Visit (INDEPENDENT_AMBULATORY_CARE_PROVIDER_SITE_OTHER): Payer: Self-pay | Admitting: Specialist

## 2021-10-04 DIAGNOSIS — E1142 Type 2 diabetes mellitus with diabetic polyneuropathy: Secondary | ICD-10-CM

## 2021-10-04 DIAGNOSIS — I739 Peripheral vascular disease, unspecified: Secondary | ICD-10-CM

## 2021-10-04 DIAGNOSIS — F17200 Nicotine dependence, unspecified, uncomplicated: Secondary | ICD-10-CM

## 2021-10-07 NOTE — Telephone Encounter (Signed)
D/C 09/02/21. Requested Prescriptions  Signed Prescriptions Disp Refills   nicotine (NICODERM CQ - DOSED IN MG/24 HOURS) 14 mg/24hr patch 15 patch 2    Sig: PLACE 1 PATCH ONTO THE SKIN DAILY     Psychiatry:  Drug Dependence Therapy Passed - 10/04/2021 11:38 AM      Passed - Valid encounter within last 12 months    Recent Outpatient Visits          1 month ago Type 2 diabetes mellitus with hypoglycemia without coma, without long-term current use of insulin (HCC)   [place] [facility] Marcine Matar, MD   5 months ago Hospital discharge follow-up   [facility] Marcine Matar, MD   6 months ago Generalized pruritus   [place] [facility] Claiborne Rigg, NP   8 months ago Preoperative evaluation to rule out surgical contraindication   [facility] Marcine Matar, MD   9 months ago Pap smear for cervical cancer screening   [facility] Marcine Matar, MD      Future Appointments            In 1 week [name] Guy Sandifer, MD [facility]   In 3 months Marcine Matar, MD [facility]            clopidogrel (PLAVIX) 75 MG tablet 30 tablet 2    Sig: TAKE 1 TABLET (75 MG TOTAL) BY MOUTH DAILY. (AM)     Hematology: Antiplatelets - clopidogrel Failed - 10/04/2021 11:38 AM      Failed - Cr in normal range and within 360 days    Creat  Date Value Ref Range Status  03/11/2016 0.90 0.50 - 1.05 mg/dL Final    Comment:      For patients > or = 62 years of age: The upper reference limit for Creatinine is approximately 13% higher for people identified as African-American.      Creatinine, Ser  Date Value Ref Range Status  03/26/2021 1.06 (H) 0.44 - 1.00 mg/dL Final   Creatinine, POC  Date Value Ref Range Status  06/10/2016 200 mg/dL Final         Passed - HCT in normal range and within 180 days     Hematocrit  Date Value Ref Range Status  05/01/2021 34.7 34.0 - 46.6 % Final         Passed - HGB in normal range and within 180 days    Hemoglobin  Date Value Ref Range Status  05/01/2021 11.3 11.1 - 15.9 g/dL Final         Passed - PLT in normal range and within 180 days    Platelets  Date Value Ref Range Status  05/01/2021 297 150 - 450 x10E3/uL Final         Passed - Valid encounter within last 6 months    Recent Outpatient Visits          1 month ago Type 2 diabetes mellitus with hypoglycemia without coma, without long-term current use of insulin [facility])   [place] [facility] Marcine Matar, MD   5 months ago Hospital discharge follow-up   [facility] Marcine Matar, MD   6 months ago Generalized pruritus   [facility] [place], Shea Stakes, NP   8 months  ago Preoperative evaluation to rule out surgical contraindication   [facility] Marcine Matar, MD   9 months ago Pap smear for cervical cancer screening   [facility] Marcine Matar, MD      Future Appointments            In 1 week [name], Guy Sandifer, MD [facility]   In 3 months Marcine Matar, MD [facility]           Refused Prescriptions Disp Refills  . glimepiride (AMARYL) 2 MG tablet [Pharmacy Med Name: GLIMEPIRIDE 2 MG ORAL TABLET] 90 tablet 1    Sig: TAKE 1 TABLET (2 MG TOTAL) BY MOUTH DAILY. (AM)     Endocrinology:  Diabetes - Sulfonylureas Failed - 10/04/2021 11:38 AM      Failed - Cr in normal range and within 360 days    Creat  Date Value Ref Range Status  03/11/2016 0.90 0.50 - 1.05 mg/dL Final    Comment:      For patients > or = 62 years of age: The upper reference limit for Creatinine is approximately 13% higher for people identified as African-American.      Creatinine, Ser  Date Value Ref  Range Status  03/26/2021 1.06 (H) 0.44 - 1.00 mg/dL Final   Creatinine, POC  Date Value Ref Range Status  06/10/2016 200 mg/dL Final         Passed - HBA1C is between 0 and 7.9 and within 180 days    HbA1c, POC (prediabetic range)  Date Value Ref Range Status  09/02/2021 6.3 5.7 - 6.4 % Final         Passed - Valid encounter within last 6 months    Recent Outpatient Visits          1 month ago Type 2 diabetes mellitus with hypoglycemia without coma, without long-term current use of insulin (HCC)   [place] [facility] Marcine Matar, MD   5 months ago Hospital discharge follow-up   [facility] Marcine Matar, MD   6 months ago Generalized pruritus    [facility] Claiborne Rigg, NP   8 months ago Preoperative evaluation to rule out surgical contraindication   [facility] Marcine Matar, MD   9 months ago Pap smear for cervical cancer screening   [facility] Marcine Matar, MD      Future Appointments            In 1 week [name], Guy Sandifer, MD [facility]   In 3 months Marcine Matar, MD [facility]

## 2021-10-08 DIAGNOSIS — F331 Major depressive disorder, recurrent, moderate: Secondary | ICD-10-CM | POA: Diagnosis not present

## 2021-10-13 ENCOUNTER — Other Ambulatory Visit: Payer: Self-pay | Admitting: *Deleted

## 2021-10-13 NOTE — Patient Outreach (Signed)
  Medicaid Managed Care   Unsuccessful Attempt Note   10/13/2021 Name: Stacy [name] [facility] MRN: [id number] DOB: 1960/08/16  Referred by: Marcine Matar, MD Reason for referral : High Risk Managed Medicaid (Unsuccessful RNCM initial outreach)   An unsuccessful telephone outreach was attempted today. The patient was referred to the case management team for assistance with care management and care coordination.    Follow Up Plan: The Managed Medicaid care management team will reach out to the patient again over the next 14 days.    [name] RN, BSN [place]  [facility]

## 2021-10-13 NOTE — Patient Instructions (Signed)
Visit Information  [name]  - as a part of your Medicaid benefit, you are eligible for care management and care coordination services at no cost or copay. I was unable to reach you by phone today but would be happy to help you with your health related needs. Please feel free to call me @ [phone].   A member of the Managed Medicaid care management team will reach out to you again over the next 14 days.   Reino Lybbert RN, BSN [place]  [facility] RN Care Coordinator   

## 2021-10-15 DIAGNOSIS — F331 Major depressive disorder, recurrent, moderate: Secondary | ICD-10-CM | POA: Diagnosis not present

## 2021-10-16 ENCOUNTER — Ambulatory Visit: Payer: Medicaid Other | Admitting: Specialist

## 2021-10-21 ENCOUNTER — Other Ambulatory Visit: Payer: Self-pay | Admitting: *Deleted

## 2021-10-21 NOTE — Patient Instructions (Signed)
Visit Information  Stacy [name] was given information about Medicaid Managed Care team care coordination services as a part of their [facility] Community Plan Medicaid benefit. Stacy [name] verbally consented to engagement with the [facility] Managed Care team.   If you are experiencing a medical emergency, please call 911 or report to your local emergency department or urgent care.   If you have a non-emergency medical problem during routine business hours, please contact your provider's office and ask to speak with a nurse.   For questions related to your [facility], please call: [phone] or visit the homepage here: [email or web address]  If you would like to schedule transportation through your [facility], please call the following number at least 2 days in advance of your appointment: [phone]   Rides for urgent appointments can also be made after hours by calling Member Services.  Call the Behavioral Health Crisis Line at [phone], at any time, 24 hours a day, 7 days a week. If you are in danger or need immediate medical attention call 911.  If you would like help to quit smoking, call 1-800-QUIT-NOW (7690139841) OR Espaol: 1-855-Djelo-Ya (3-976-734-1937) o para ms informacin haga clic aqu or Text READY to 902-409 to register via text  Stacy [name],   Please see education materials related to diabetes provided by MyChart link.  Patient verbalizes understanding of instructions and care plan provided today and agrees to view in MyChart. Active MyChart status and patient understanding of how to access instructions and care plan via MyChart confirmed with patient.     Telephone follow up appointment with Managed Medicaid care management team member scheduled for:11/24/21 @ 10:30am  [name] RN, BSN [place]  [facility] RN Care  Coordinator   Following is a copy of your plan of care:  Care Plan : RN Care Manager Plan of Care  Updates made by [name], RN since 10/21/2021 12:00 AM     Problem: Health Management needs related to DMII      Long-Range Goal: Development of Plan of Care to address Health Management needs related to DMII   Start Date: 10/21/2021  Expected End Date: 01/19/2022  Priority: High  Note:   Current Barriers:  Chronic Disease Management support and education needs related to DMII  RNCM Clinical Goal(s):  Patient will verbalize understanding of plan for management of DMII as evidenced by patient reports take all medications exactly as prescribed and will call provider for medication related questions as evidenced by patient reports and EMR documentation    attend all scheduled medical appointments: 10/31/21 and 11/21/21 for Diabetic Foot Care as evidenced by provider documentation        work with pharmacist to address Medication management related to DMII as evidenced by review of EMR and patient or pharmacist report    work with Child psychotherapist to address Limited access to food related to the management of DMII as evidenced by review of EMR and patient or Child psychotherapist report     through collaboration with Medical illustrator, provider, and care team.   Interventions: Inter-disciplinary care team collaboration (see longitudinal plan of care) Evaluation of current treatment plan related to  self management and patient's adherence to plan as established by provider Collaborate with PCP for requested DME(shower chair)   Diabetes:  (Status: New goal.) Long Term Goal   Lab Results  Component Value Date   HGBA1C 6.3 09/02/2021   @ Assessed patient's  understanding of A1c goal: <7% Provided education to patient about basic DM disease process; Reviewed medications with patient and discussed importance of medication adherence;        Reviewed prescribed diet with patient diabetic  diet; Provided patient with written educational materials related to hypo and hyperglycemia and importance of correct treatment;       call provider for findings outside established parameters;       Referral made to pharmacy team for assistance with understanding medications;       Referral made to social work team for assistance with food insecurity;      Review of patient status, including review of consultants reports, relevant laboratory and other test results, and medications completed;       Assessed social determinant of health barriers;        Provided patient with Urgent Tooth [phone] denture evaluation  Patient Goals/Self-Care Activities: Take medications as prescribed   Attend all scheduled provider appointments Call provider office for new concerns or questions  check blood sugar at prescribed times: twice daily take the blood sugar meter to all doctor visits keep a food diary

## 2021-10-21 NOTE — Patient Outreach (Signed)
Medicaid Managed Care   Nurse Care Manager Note  10/21/2021 Name:  Stacy [name] [facility] MRN:  [id number] DOB:  1961/02/01  Stacy [name] is an 61 y.o. year old female who is a primary patient of [name], MD.  The [facility] Managed Care Coordination team was consulted for assistance with:    DMII  Stacy [name] was given information about Medicaid Managed Care Coordination team services today. Stacy Church Essman Patient agreed to services and verbal consent obtained.  Engaged with patient by telephone for initial visit in response to provider referral for case management and/or care coordination services.   Assessments/Interventions:  Review of past medical history, allergies, medications, health status, including review of consultants reports, laboratory and other test data, was performed as part of comprehensive evaluation and provision of chronic care management services.  SDOH (Social Determinants of Health) assessments and interventions performed: SDOH Interventions    Flowsheet Row Patient Outreach Telephone from 10/21/2021 in [place] Coordination  SDOH Interventions   Housing Interventions Intervention Not Indicated  Transportation Interventions Intervention Not Indicated       Care Plan  Allergies  Allergen Reactions   Other Shortness Of Breath    UNSPECIFIED AGENTS Allergic to perfumes and cleaning products   Shellfish Allergy Anaphylaxis    Per allergy test.   Ace Inhibitors Cough and Other (See Comments)        Celecoxib      upset stomach  Pt is taking med    Aspirin Nausea Only    stomach upset     Medications Reviewed Today     Reviewed by [name], RN (Registered Nurse) on 10/21/21 at 1352  Med List Status: <None>   Medication Order Taking? Sig Documenting Provider Last Dose Status Informant  Accu-Chek Softclix Lancets lancets [id number] Yes USE AS INSTRUCTED 3 TIMES A DAY [name],  MD Taking Active Self  albuterol (PROVENTIL) (2.5 MG/3ML) 0.083% nebulizer solution [id number] Yes USE ONE VIAL (2.5 MG TOTAL) BY NEBULIZATION EVERY 6 (SIX) HOURS AS NEEDED FOR WHEEZING. [name], MD Taking Active   allopurinol (ZYLOPRIM) 100 MG tablet [id number] Yes TAKE 1 TABLET (100 MG TOTAL) BY MOUTH DAILY.(AM) [name], PA-C Taking Active   atorvastatin (LIPITOR) 40 MG tablet [id number] Yes TAKE 1 TABLET (40 MG TOTAL) BY MOUTH DAILY. (BEDTIME) [name], MD Taking Active   bisacodyl (DULCOLAX) 5 MG EC tablet [id number] Yes Take 2 tablets at 12:00 noon and 2 Tablets at 2:00 pm [provider] Taking Active   Blood Glucose Monitoring Suppl (ACCU-CHEK AVIVA PLUS) w/Device KIT [id number] Yes 1 each by Does not apply route 3 (three) times daily. [place] Donovan, [place] Taking Active Self  budesonide-formoterol (SYMBICORT) 80-4.5 MCG/ACT inhaler [id number] Yes INHALE TWO PUFFS BY MOUTH TWICE A DAY [name], MD Taking Active Self  celecoxib (CELEBREX) 200 MG capsule [id number] Yes TAKE 1 CAPSULE (200 MG TOTAL) BY MOUTH 2 (TWO) TIMES DAILY. (AM+BEDTIME) [name], MD Taking Active   clopidogrel (PLAVIX) 75 MG tablet [id number] Yes TAKE 1 TABLET (75 MG TOTAL) BY MOUTH DAILY. (AM) [name], MD Taking Active   diclofenac Sodium (VOLTAREN) 1 % GEL [id number] Yes 1 application [provider] Taking Active   dicyclomine (BENTYL) 10 MG capsule [id number] Yes Take 10 mg by mouth in the morning, at noon, and at bedtime. [provider] Taking Active Self  docusate sodium (COLACE) 100 MG capsule [id number] No Take 1  capsule (100 mg total) by mouth 2 (two) times daily.  Patient not taking: Reported on 10/21/2021   [name], MD Not Taking Active   DULoxetine (CYMBALTA) 60 MG capsule [id number] Yes TAKE 1 CAPSULE (60 MG TOTAL) BY MOUTH DAILY (AM) [name], MD Taking Active   EPINEPHrine 0.3 mg/0.3 mL IJ SOAJ injection [id number] Yes 0.3 mg IM  x 1 PRN for allergic reaction  Patient taking differently: Inject 0.3 mg into the muscle once as needed for anaphylaxis.   [name], MD Taking Active Self  ferrous gluconate (FERGON) 324 MG tablet [id number] No Take 1 tablet (324 mg total) by mouth 2 (two) times daily with a meal.  Patient not taking: Reported on 10/21/2021   [name], MD Not Taking Active   ferrous sulfate ([name]) 325 (65 FE) MG tablet [id number] Yes TAKE ONE TABLET ( 325 MG ) BY MOUTH DAILY (AM) [name], MD Taking Active   FLOWFLEX COVID-19 AG HOME TEST KIT [id number]  See admin instructions. [provider]  Active Self  fluticasone (FLONASE) 50 MCG/ACT nasal spray [id number] Yes Place 1 spray into both nostrils daily. [name], MD Taking Active Self    Discontinued 04/16/11 [phone] gabapentin (NEURONTIN) 600 MG tablet [id number] Yes 1 capsule [provider] Taking Active   Glucagon 1 MG/0.2ML SOSY [id number] Yes 1 mg arrange subcutaneously as needed for severe symptomatic low blood sugar [name], MD Taking Active   glucose blood (ACCU-CHEK AVIVA PLUS) test strip [id number] Yes Use as instructed [name], MD Taking Active   glucose chewable tablet 12 g [id number]   [name], MD  Active   glycopyrrolate (ROBINUL) 2 MG tablet [id number] Yes Take 2 mg by mouth 3 (three) times daily. [provider] Taking Active   hydrochlorothiazide (HYDRODIURIL) 25 MG tablet [id number] Yes 1 tablet in the morning [provider] Taking Active   HYDROcodone-acetaminophen (NORCO/VICODIN) 5-325 MG tablet [id number] No 1 tablet as needed  Patient not taking: Reported on 10/21/2021   [provider] Not Taking Active   hydrOXYzine (ATARAX) 10 MG tablet [id number] Yes TAKE 1 TABLET IN THE MORNING AND NOON AND 2 TABLETS IN THE EVENING AS NEEDED. [name], MD Taking Active   Lancets [facility] SOFT [facility] lancets [id number] Yes Use as instructed  [name] Taking Active Self    Discontinued 04/16/11 0916 (Error) loperamide (IMODIUM) 2 MG capsule [id number] Yes TAKE 1 CAPSULE (2 MG TOTAL) BY MOUTH 4 (FOUR) TIMES DAILY AS NEEDED FOR DIARRHEA OR LOOSE STOOLS. [name], MD Taking Active Self  loratadine (CLARITIN) 10 MG tablet [id number] Yes TAKE 1 TABLET (10 MG TOTAL) BY MOUTH DAILY. (AM) [name], MD Taking Active   melatonin 5 MG TABS [id number] Yes 1 tablet in the evening [provider] Taking Active   meloxicam (MOBIC) 7.5 MG tablet [id number] Yes 1 tablet [provider] Taking Active   metaxalone (SKELAXIN) 800 MG tablet [id number] No Take 1 tablet by mouth 3 (three) times daily.  Patient not taking: Reported on 10/21/2021   [provider] Not Taking Active   metFORMIN (GLUCOPHAGE) 850 MG tablet [id number] No 1 tablet with a meal  Patient not taking: Reported on 10/21/2021   [provider] Not Taking Consider Medication Status and Discontinue (Patient Preference)   metFORMIN (GLUCOPHAGE-XR) 750 MG 24 hr tablet [id number] Yes TAKE 1 TABLET (750 MG TOTAL) BY MOUTH 2 (TWO) TIMES DAILY (AM+BEDTIME)  [name], MD Taking Active   methocarbamol (ROBAXIN) 500 MG tablet [id number] Yes Take 1 tablet (500 mg total) by mouth every 8 (eight) hours as needed for muscle spasms. [name], MD Taking Consider Medication Status and Discontinue (Patient Preference)   methocarbamol (ROBAXIN) 750 MG tablet [id number] Yes 1 tablet [name], MD Taking Active   metoCLOPramide (REGLAN) 5 MG tablet [id number] Yes Take 5 mg by mouth 3 (three) times daily as needed for nausea or vomiting. Take 1 tablet by mouth three times a day before meals as needed for nausea and vomiting. [provider] Taking Active Self  montelukast (SINGULAIR) 10 MG tablet [id number] No Take 10 mg by mouth at bedtime.   Patient not taking: Reported on 10/21/2021   [provider] Not Taking  Active Self  montelukast (SINGULAIR) 10 MG tablet [id number] Yes Take 1 tablet by mouth daily. [provider] Taking Active   Multiple Vitamins-Minerals (CENTRUM SILVER 50+WOMEN PO) [id number] Yes Take 1 tablet by mouth daily. [provider] Taking Active Self  nicotine (NICODERM CQ - DOSED IN MG/24 HOURS) 14 mg/24hr patch [id number] No 1 patch to skin  Patient not taking: Reported on 10/21/2021   [provider] Not Taking Active   nicotine (NICODERM CQ - DOSED IN MG/24 HOURS) 14 mg/24hr patch [id number] No PLACE 1 PATCH ONTO THE SKIN DAILY  Patient not taking: Reported on 10/21/2021   [name], MD Not Taking Active   ofloxacin (OCUFLOX) 0.3 % ophthalmic solution [id number] No Place 1 drop into the right eye 4 (four) times daily.  Patient not taking: Reported on 10/21/2021   [provider] Not Taking Active Self  oxyCODONE (OXY IR/ROXICODONE) 5 MG immediate release tablet [id number] No Take 1 tablet (5 mg total) by mouth every 6 (six) hours as needed for moderate pain ((score 4 to 6)).  Patient not taking: Reported on 10/21/2021   [name], PA-C Not Taking Active   oxyCODONE (OXYCONTIN) 15 mg 12 hr tablet [id number] No Take 1 tablet (15 mg total) by mouth every 12 (twelve) hours.  Patient not taking: Reported on 10/21/2021   [name], PA-C Not Taking Active   pantoprazole (PROTONIX) 40 MG tablet [id number] Yes Take 1 tablet (40 mg total) by mouth daily. [name], MD Taking Active Self  permethrin (ELIMITE) 5 % cream [id number] Yes Apply to all areas of the body from the neck to soles of feet leave on for 8 to 14 hours before removing by washing (shower or bath). Gildardo Pounds, NP Taking Active   polyethylene glycol (MIRALAX / GLYCOLAX) 17 g packet [id number] Yes Take 17 g by mouth daily as needed for moderate constipation. [provider] Taking Active Self  potassium chloride SA (KLOR-CON M) 20 MEQ tablet [id number] Yes TAKE  ONE TABLET BY MOUTH ONCE DAILY (NOON) [name], MD Taking Active   promethazine (PHENERGAN) 12.5 MG tablet [id number] Yes Take 1 tablet (12.5 mg total) by mouth 2 (two) times daily as needed for nausea or vomiting. [name], MD Taking Active   Respiratory Therapy Supplies (FLUTTER) DEVI [id number] Yes Use after breathing treatment 4 times daily Elsie Stain, MD Taking Active Self  sucralfate (CARAFATE) 1 g tablet [id number] Yes Take 1 g by mouth 4 (four) times daily -  with meals and at bedtime. [provider] Taking Active Self  tiotropium (SPIRIVA HANDIHALER) 18 MCG inhalation capsule [id number] Yes PLACE 1 CAPSULE (18 MCG  TOTAL) INTO INHALER AND INHALE DAILY. [name], MD Taking Active   tiZANidine (ZANAFLEX) 2 MG tablet [id number] Yes Take 2 mg by mouth 3 (three) times daily. [provider] Taking Active   tobramycin-dexamethasone [facility]) ophthalmic ointment [id number] No 1 application for L eye  Patient not taking: Reported on 10/21/2021   [provider] Not Taking Active   traMADol (ULTRAM) 50 MG tablet [id number] Yes 1 tablet as needed [provider] Taking Active   triamcinolone cream (KENALOG) 0.1 % [id number] Yes Apply 1 application topically 2 (two) times daily. Mayers, Cari S, PA-C Taking Active Self  triamcinolone cream (KENALOG) 0.1 % [id number] Yes 1 application [provider] Taking Active   valsartan (DIOVAN) 160 MG tablet [id number] No   Patient not taking: Reported on 10/21/2021   [provider] Not Taking Active   valsartan-hydrochlorothiazide (DIOVAN-HCT) 160-12.5 MG tablet [id number] Yes TAKE ONE TABLET BY MOUTH ONCE DAILY (AM) [name], MD Taking Active             Patient Active Problem List   Diagnosis Date Noted   Spondylolisthesis, lumbar region    Other spondylosis with radiculopathy, lumbar region    Other secondary scoliosis, lumbar region    Fusion of spine of  lumbar region 03/25/2021   Colon polyps 06/06/2020   Lupus ([place]) 06/06/2020   Allergic rhinitis due to animal (cat) (dog) hair and dander 04/08/2020   Allergic rhinitis due to pollen 04/08/2020   Food allergy 04/08/2020   Acute medial meniscus tear, left, subsequent encounter 03/21/2020   Chronic pain of left knee 02/15/2020   Paresthesia of skin 11/16/2019   History of total knee replacement, right 11/16/2019   Tobacco abuse 11/16/2019   Centrilobular emphysema ([place]) 08/10/2019   Incidental lung nodule, > 42m and < 871m07/09/2019   OSA on CPAP 08/10/2019   Dyspnea on exertion 08/02/2019   Hyperlipidemia 08/02/2019   Lumbar radiculopathy 04/24/2019   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   Post laminectomy syndrome 02/23/2019   Abnormality of gait 02/23/2019   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   Unilateral primary osteoarthritis, left hip 12/28/2018   Lesion of skin of left ear 12/26/2018   Primary osteoarthritis of left hip 12/02/2018   Iron deficiency anemia 10/16/2018   Chronic pain syndrome 09/08/2018   Chronic pain of right knee 08/11/2018   Status post lumbar laminectomy 07/15/2018   Peripheral arterial disease ([place]03/04/2018   Moderate persistent asthma without complication 0507/37/1062 Environmental and seasonal allergies 06/29/2017   Controlled type 2 diabetes mellitus with diabetic polyneuropathy, without long-term current use of insulin ([place]05/28/2019   Perennial allergic rhinitis 04/08/2017   Sensorineural hearing loss (SNHL), bilateral 04/08/2017   Chronic pansinusitis 03/25/2017   Eustachian tube dysfunction, bilateral 0269/48/5462 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/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 10/02/2016    Class: Chronic   Alopecia areata 08/19/2016   Chondromalacia of both patellae 06/03/2015    Class: Chronic   Spinal stenosis, lumbar region, with neurogenic claudication 06/03/2015   Tobacco use disorder 04/25/2015    DJD (degenerative joint disease) of knee 01/04/2015   Hemorrhoid 11/14/2014   Gout of big toe 07/19/2014   Essential hypertension 08/14/2013   Gastroesophageal reflux disease without esophagitis 08/14/2013   COPD (chronic obstructive pulmonary disease) (HCA[place]03/15/2013    Conditions to be addressed/monitored per PCP order:  DMII  Care Plan : RN Care Manager Plan of Care  Updates  made by [name], RN since 10/21/2021 12:00 AM     Problem: Health Management needs related to DMII      Long-Range Goal: Development of Plan of Care to address Health Management needs related to DMII   Start Date: 10/21/2021  Expected End Date: 01/19/2022  Priority: High  Note:   Current Barriers:  Chronic Disease Management support and education needs related to DMII  RNCM Clinical Goal(s):  Patient will verbalize understanding of plan for management of DMII as evidenced by patient reports take all medications exactly as prescribed and will call provider for medication related questions as evidenced by patient reports and EMR documentation    attend all scheduled medical appointments: 10/31/21 and 11/21/21 for Diabetic Foot Care as evidenced by provider documentation        work with pharmacist to address Medication management related to DMII as evidenced by review of EMR and patient or pharmacist report    work with Education officer, museum to address Limited access to food related to the management of DMII as evidenced by review of EMR and patient or Education officer, museum report     through collaboration with Consulting civil engineer, provider, and care team.   Interventions: Inter-disciplinary care team collaboration (see longitudinal plan of care) Evaluation of current treatment plan related to  self management and patient's adherence to plan as established by provider Collaborate with PCP for requested DME(shower chair)   Diabetes:  (Status: New goal.) Long Term Goal   Lab Results  Component Value Date   HGBA1C 6.3  09/02/2021   @ Assessed patient's understanding of A1c goal: <7% Provided education to patient about basic DM disease process; Reviewed medications with patient and discussed importance of medication adherence;        Reviewed prescribed diet with patient diabetic diet; Provided patient with written educational materials related to hypo and hyperglycemia and importance of correct treatment;       call provider for findings outside established parameters;       Referral made to pharmacy team for assistance with understanding medications;       Referral made to social work team for assistance with food insecurity;      Review of patient status, including review of consultants reports, relevant laboratory and other test results, and medications completed;       Assessed social determinant of health barriers;        Provided patient with Urgent Tooth [phone] denture evaluation  Patient Goals/Self-Care Activities: Take medications as prescribed   Attend all scheduled provider appointments Call provider office for new concerns or questions  check blood sugar at prescribed times: twice daily take the blood sugar meter to all doctor visits keep a food diary       Follow Up:  Patient agrees to Care Plan and Follow-up.  Plan: The Managed Medicaid care management team will reach out to the patient again over the next 30 days.  Date/time of next scheduled RN care management/care coordination outreach:  11/24/21 @ 10:30am  [name] RN, BSN [place] RN Care Coordinator

## 2021-10-22 ENCOUNTER — Telehealth: Payer: Self-pay | Admitting: Pharmacist

## 2021-10-22 DIAGNOSIS — F331 Major depressive disorder, recurrent, moderate: Secondary | ICD-10-CM | POA: Diagnosis not present

## 2021-10-22 NOTE — Progress Notes (Signed)
Contacted patient regarding referral for medication management from Case Management team  Appointment scheduled   Catie Hedwig Morton, PharmD, [place] [phone]

## 2021-10-23 ENCOUNTER — Ambulatory Visit: Payer: Medicaid Other | Attending: Internal Medicine

## 2021-10-23 DIAGNOSIS — Z23 Encounter for immunization: Secondary | ICD-10-CM | POA: Diagnosis not present

## 2021-10-24 ENCOUNTER — Other Ambulatory Visit: Payer: Self-pay

## 2021-10-24 NOTE — Patient Outreach (Signed)
Care Coordination  10/24/2021  Stacy [name] [facility] [birth year] [id number]    Medicaid Managed Care   Unsuccessful Outreach Note  10/24/2021 Name: Stacy [name] [facility] MRN: [id number] DOB: [birth year]  Referred by: [name], MD Reason for referral : High Risk Managed Medicaid (MM Social work unsuccessful telephone outreach )   An unsuccessful telephone outreach was attempted today. The patient was referred to the case management team for assistance with care management and care coordination.   Follow Up Plan: The care management team will reach out to the patient again over the next 7 days.   Mickel Fuchs, BSW, [place] Managed Medicaid Team  [phone]

## 2021-10-24 NOTE — Patient Instructions (Signed)
Visit Information  [name]  - as a part of your Medicaid benefit, you are eligible for care management and care coordination services at no cost or copay. I was unable to reach you by phone today but would be happy to help you with your health related needs. Please feel free to call me @ ([phone]).   A member of the Managed Medicaid care management team will reach out to you again over the next 7 days.   Tonye Tancredi, BSW, MHA [facility]  [place]  High Risk Managed Medicaid Team  [phone]  

## 2021-10-27 NOTE — Progress Notes (Signed)
Patient returns.  She continues have ongoing right knee pain.  Last office visit Dr. [name] ordered MRI lumbar spine and this is pending.  Has not all symptoms are the same for his previously addressed.  Plan follow-up with Dr. [name] after completion of her MRI lumbar to discuss results and further treatment options.

## 2021-10-29 DIAGNOSIS — F331 Major depressive disorder, recurrent, moderate: Secondary | ICD-10-CM | POA: Diagnosis not present

## 2021-10-30 ENCOUNTER — Other Ambulatory Visit: Payer: Medicaid Other | Admitting: Pharmacist

## 2021-10-30 NOTE — Progress Notes (Deleted)
Care Coordination Call  Noted ED visit for hyperglycemia. Contacted patient. Reviewed that he has endocrinology appointment upcoming on Wednesday. Patient expressed concerns about medication costs. Reviewed income; he is over income for Medicare Extra Help, but does continue to qualify for medication assistance through manufacturer programs.   He verbalized that per Dr. Levy's instructions, he has continue Trulicity 4.5 mg weekly, Jardiance 25 mg daily, he continues to hold Humalog per instruction and stopped metformin per instruction. He notes he has been taking 16 units of Basaglar daily. Verbalized that per Dr. Levy's last note, he should increase to 24 units daily. Per Libre download, glucoses remain elevated in 300-400. Patient notes he will increase Basaglar dose. We reviewed colors of pens as he has a difficult time remembering the names.   Discussed that we can apply for patient assistance for GLP1, insulin, and SGLT2 for 2024. He previously was enrolled for Trulicity, Basaglar, and Humalog through [employer] and Jardiance through [employer]. Encouraged him to let Dr. Levy know that we could just as easily apply for Ozempic (and Tresiba and Novolog) assistance from [employer] if we would prefer, given greater glycemic benefit. Patient was given my work number to give to Dr. Levy's office for coordination of medication assistance applications.   Patient is also aware he needs to call [facility] to provide credit card information for the oral medications they have ready for him.   Patient agrees to call me Wednesday after endocrinology appointment for follow up.   Catie T. Elleah Hemsley, PharmD, BCACP, CPP [facility] [phone]   

## 2021-10-30 NOTE — Progress Notes (Signed)
Chief Complaint  Patient presents with   Medication Management    Stacy [name] is a 61 y.o. year old female who presented for a telephone visit.   They were referred to the pharmacist by their Case Management Team  for assistance in managing complex medication management.   Patient is participating in a Managed Medicaid Plan:  Yes  Subjective:  Care Team: Primary Care Provider: [name], MD ; Next Scheduled Visit: 01/05/22 Neurology: Posey Pronto; Next Scheduled Visit: 01/12/22 Pain Management/Ortho: [name]; Next Scheduled Visit: 11/04/21  Medication Access/Adherence  Current Pharmacy:  [place], [place] - [address] [place] [address] Phone: [phone] Fax: [place] [place], [address] [place] [address] Phone: [phone] Fax: [phone]   Patient reports affordability concerns with their medications: No  Patient reports access/transportation concerns to their pharmacy: No  Patient reports adherence concerns with their medications:  No     Diabetes:  Current medications: metformin XR 750 mg twice daily, though also realized through medication review that [facility] is still packaging glimepiride  Current glucose readings: reports fastings 110-160s, with periodic hypoglycemic episodes to the 50s   Patient reports hypoglycemic s/sx including dizziness, shakiness, sweating.   Reports she has a busy routine with caring for family members. Doesn't always regularly eat meals. Confirms she picked up the Gvoke (glucagon) syringe, but does not know when/how to use it.   Hypertension:  Current medications: valsartan/HCTZ 160/12.5 mg daily  Additionally on potassium 20 mEq daily  Hyperlipidemia/ASCVD Risk Reduction  Current lipid lowering medications: atorvastatin 40 mg  daily  Antiplatelet regimen: clopidogrel 75 mg daily  Asthma/Allergies:  Current medications: Symbicort 160/4.5 mcg 2 puffs twice daily, Spiriva 18 mcg daily, montelukast 10 mg daily, loratadine 10 mg daily, albuterol nebulizer PRN - reports using 1-2 times weekly; fluticasone nasal spray daily PRN  Additionally on hydroxyzine 10 mg PRN itching or anxiety  GERD and IBS Current medications: pantoprazole 40 mg twice daily, sucralfate 1 g four times daily; dicyclomine 10 mg PRN  Reports she does continue to have acid reflux symptoms if she doesn't take sucralfate  Chronic Pain: Current medications: celecoxib 200 mg twice daily, duloxetine 60 mg daily, tizanidine 2 mg PRN  Gout: Current medications: allopurinol 100 mg daily    Health Maintenance  Health Maintenance Due  Topic Date Due   COVID-19 Vaccine (5 - Pfizer risk series) 04/09/2020   FOOT EXAM  06/06/2021   TETANUS/TDAP  09/06/2021   Zoster Vaccines- Shingrix (2 of 2) 10/28/2021     Objective: Lab Results  Component Value Date   HGBA1C 6.3 09/02/2021    Lab Results  Component Value Date   CREATININE 1.06 (H) 03/26/2021   BUN 11 03/26/2021   NA 132 (L) 03/26/2021   K 3.8 03/26/2021   CL 95 (L) 03/26/2021   CO2 28 03/26/2021    Lab Results  Component Value Date   CHOL 160 01/20/2021   HDL 49 01/20/2021   LDLCALC 94 01/20/2021   TRIG 91 01/20/2021   CHOLHDL 3.3 01/20/2021    Medications Reviewed Today     Reviewed by Osker Mason, RPH-CPP (Pharmacist) on 10/30/21 at [place] List Status: <None>   Medication Order Taking? Sig Documenting Provider Last Dose Status Informant  Accu-Chek Softclix Lancets lancets [id number] Yes USE AS INSTRUCTED  3 TIMES A DAY [name], MD Taking Active Self  albuterol (PROVENTIL) (2.5 MG/3ML) 0.083% nebulizer solution [id number] Yes USE ONE VIAL (2.5 MG TOTAL) BY NEBULIZATION EVERY 6 (SIX) HOURS AS NEEDED FOR WHEEZING. [name], MD Taking Active             Med Note [name], [name] Oct 30, 2021  3:51 PM) Using 1-2 times weekly  allopurinol (ZYLOPRIM) 100 MG tablet [id number] Yes TAKE 1 TABLET (100 MG TOTAL) BY MOUTH DAILY.(AM) [name], PA-C Taking Active   atorvastatin (LIPITOR) 40 MG tablet [id number] Yes TAKE 1 TABLET (40 MG TOTAL) BY MOUTH DAILY. (BEDTIME) [name], MD Taking Active   Blood Glucose Monitoring Suppl (ACCU-CHEK AVIVA PLUS) w/Device KIT [id number] Yes 1 each by Does not apply route 3 (three) times daily. Argentina Donovan, [place] Taking Active Self  celecoxib (CELEBREX) 200 MG capsule [id number] Yes TAKE 1 CAPSULE (200 MG TOTAL) BY MOUTH 2 (TWO) TIMES DAILY. (AM+BEDTIME) [name], MD Taking Active   clopidogrel (PLAVIX) 75 MG tablet [id number] Yes TAKE 1 TABLET (75 MG TOTAL) BY MOUTH DAILY. (AM) [name], MD Taking Active   diclofenac Sodium (VOLTAREN) 1 % GEL [id number] Yes Apply 2 g topically 4 (four) times daily. [provider] Taking Active   dicyclomine (BENTYL) 10 MG capsule [id number] Yes Take 10 mg by mouth in the morning, at noon, and at bedtime. [provider] Taking Active Self  DULoxetine (CYMBALTA) 60 MG capsule [id number] Yes TAKE 1 CAPSULE (60 MG TOTAL) BY MOUTH DAILY (AM) [name], MD Taking Active   EPINEPHrine 0.3 mg/0.3 mL IJ SOAJ injection [id number] No 0.3 mg IM x 1 PRN for allergic reaction  Patient not taking: Reported on 10/30/2021   [name], MD Not Taking Active Self  fluticasone (FLONASE) 50 MCG/ACT nasal spray [id number] Yes Place 1 spray into both nostrils daily. [name], MD Taking Active Self    Discontinued 04/16/11 0943 Glucagon 1 MG/0.2ML SOSY [id number] No 1 mg arrange subcutaneously as needed for severe symptomatic low blood sugar  Patient not taking: Reported on 10/30/2021   [name], MD Not Taking Active   glucose blood (ACCU-CHEK AVIVA PLUS) test strip [id number] Yes Use as instructed [name], MD  Taking Active   glucose chewable tablet 12 g [id number]   [name], MD  Consider Medication Status and Discontinue (Patient Preference)   hydrOXYzine (ATARAX) 10 MG tablet [id number] Yes TAKE 1 TABLET IN THE MORNING AND NOON AND 2 TABLETS IN THE EVENING AS NEEDED. [name], MD Taking Active   Lancets [facility] SOFT [facility]) lancets [id number] Yes Use as instructed [name] Taking Active Self    Discontinued 04/16/11 0916 (Error) loratadine (CLARITIN) 10 MG tablet [id number] Yes TAKE 1 TABLET (10 MG TOTAL) BY MOUTH DAILY. (AM) [name], MD Taking Active   melatonin 5 MG TABS [id number] Yes 1 tablet in the evening [provider] Taking Active   metFORMIN (GLUCOPHAGE-XR) 750 MG 24 hr tablet [id number] Yes TAKE 1 TABLET (750 MG TOTAL) BY MOUTH 2 (TWO) TIMES DAILY (AM+BEDTIME) [name], MD Taking Active   metoCLOPramide (REGLAN) 5 MG tablet [id number] No Take 5 mg by mouth 3 (three) times daily as needed for nausea or vomiting. Take 1 tablet by mouth three times a day before meals as needed for nausea and vomiting. [provider] Unknown Active Self  montelukast (SINGULAIR)  10 MG tablet [id number] Yes Take 1 tablet by mouth daily. [provider] Taking Active   Multiple Vitamins-Minerals (CENTRUM SILVER 50+WOMEN PO) [id number] Yes Take 1 tablet by mouth daily. [provider] Taking Active Self  nicotine (NICODERM CQ - DOSED IN MG/24 HOURS) 14 mg/24hr patch [id number] No 1 patch to skin  Patient not taking: Reported on 10/21/2021   [provider] Not Taking Active   nicotine (NICODERM CQ - DOSED IN MG/24 HOURS) 14 mg/24hr patch [id number] No PLACE 1 PATCH ONTO THE SKIN DAILY  Patient not taking: Reported on 10/21/2021   [name], MD Not Taking Active   pantoprazole (PROTONIX) 40 MG tablet [id number] Yes Take 40 mg by mouth 2 (two) times daily. [provider] Taking Active   polyethylene glycol  (MIRALAX / GLYCOLAX) 17 g packet [id number]  Take 17 g by mouth daily as needed for moderate constipation. [provider]  Active Self  potassium chloride SA (KLOR-CON M) 20 MEQ tablet [id number] Yes TAKE ONE TABLET BY MOUTH ONCE DAILY (NOON) [name], MD Taking Active   Respiratory Therapy Supplies (FLUTTER) DEVI [id number]  Use after breathing treatment 4 times daily Elsie Stain, MD  Active Self  sucralfate (CARAFATE) 1 g tablet [id number] Yes Take 1 g by mouth 4 (four) times daily -  with meals and at bedtime. [provider] Taking Active Self  SYMBICORT 160-4.5 MCG/ACT inhaler [id number]  Inhale 2 puffs into the lungs 2 (two) times daily. [provider]  Active   tiotropium (SPIRIVA HANDIHALER) 18 MCG inhalation capsule [id number] Yes PLACE 1 CAPSULE (18 MCG TOTAL) INTO INHALER AND INHALE DAILY. [name], MD Taking Active   tiZANidine (ZANAFLEX) 2 MG tablet [id number] Yes Take 2 mg by mouth 3 (three) times daily. [provider] Taking Active   traMADol (ULTRAM) 50 MG tablet [id number] No 1 tablet as needed  Patient not taking: Reported on 10/30/2021   [provider] Not Taking Active   triamcinolone cream (KENALOG) 0.1 % [id number] Yes Apply 1 application topically 2 (two) times daily. Mayers, Cari S, PA-C Taking Active Self  valsartan-hydrochlorothiazide (DIOVAN-HCT) 160-12.5 MG tablet [id number] Yes TAKE ONE TABLET BY MOUTH ONCE DAILY (AM) [name], MD Taking Active               Assessment/Plan:   Diabetes: - Controlled but with hypoglycemia, but patient had not communicated that glimepiride was discontinued to [place]. Contacted [facility], discontinued glimepiride prescription per Dr. [name].  - Recommended to continue current regimen at this time - Counseled on appropriate use of glucagon - Recommend to continue metformin XR 750 mg twice daily - Discussed checking fasting and 2 hour post prandial  readings, documenting, and we will discuss at next visit  Hypertension: - Controlled per last office reading - Recommended to continue current regimen at this time. Discussed checking home blood pressure periodically, documenting, and we will review at next visit  Hyperlipidemia/ASCVD Risk Reduction - Controlled per last lipid panel but due for updated labs - Recommended to continue current regimen at this time  Asthma/Allergies: - Controlled per patient report.  - Discussed concepts of maintenance vs rescue therapy - Recommended to continue current regimen at this time  GERD and IBS - Controlled on therapy per patient report.  - Discussed separating sucralfate from other medications - Recommended to continue current regimen at this time  Chronic Pain: - Moderately well controlled per patient report - Recommended to continue current regimen  at this time  Gout: - Controlled per patient symptom report and last uric acid - Recommended to continue current regimen at this time  Follow Up Plan: phone call in 2 weeks  Catie Hedwig Morton, PharmD, [place] [phone]

## 2021-10-30 NOTE — Progress Notes (Deleted)
Chief Complaint  Patient presents with   Medication Management    {Visit Type:26650}  Patient is participating in a Managed Medicaid Plan:  {MM YES/NO:27447::"Yes"}  Subjective:  Care Team: Primary Care Provider: [name], MD ; Next Scheduled Visit: *** {careteamprovider:27366}  Medication Access/Adherence  Current Pharmacy:  [place], [place] - [address] [place] [address] Phone: [phone] Fax: [place], [place] [address] [place] [address] Phone: [phone] Fax: [phone]   Patient reports affordability concerns with their medications: {YES/NO:21197} Patient reports access/transportation concerns to their pharmacy: {YES/NO:21197} Patient reports adherence concerns with their medications:  {YES/NO:21197} ***   {Pharmacy S/O Choices:26420}   Health Maintenance  Health Maintenance Due  Topic Date Due   COVID-19 Vaccine (5 - Pfizer risk series) 04/09/2020   FOOT EXAM  06/06/2021   TETANUS/TDAP  09/06/2021   Zoster Vaccines- Shingrix (2 of 2) 10/28/2021     Objective: Lab Results  Component Value Date   HGBA1C 6.3 09/02/2021    Lab Results  Component Value Date   CREATININE 1.06 (H) 03/26/2021   BUN 11 03/26/2021   NA 132 (L) 03/26/2021   K 3.8 03/26/2021   CL 95 (L) 03/26/2021   CO2 28 03/26/2021    Lab Results  Component Value Date   CHOL 160 01/20/2021   HDL 49 01/20/2021   LDLCALC 94 01/20/2021   TRIG 91 01/20/2021   CHOLHDL 3.3 01/20/2021    Medications Reviewed Today     Reviewed by [name], RN (Registered Nurse) on 10/21/21 at 1352  Med List Status: <None>   Medication Order Taking? Sig Documenting Provider Last Dose Status Informant  Accu-Chek Softclix Lancets lancets [id number] Yes USE AS INSTRUCTED 3 TIMES A DAY [name],  MD Taking Active Self  albuterol (PROVENTIL) (2.5 MG/3ML) 0.083% nebulizer solution [id number] Yes USE ONE VIAL (2.5 MG TOTAL) BY NEBULIZATION EVERY 6 (SIX) HOURS AS NEEDED FOR WHEEZING. [name], MD Taking Active   allopurinol (ZYLOPRIM) 100 MG tablet [id number] Yes TAKE 1 TABLET (100 MG TOTAL) BY MOUTH DAILY.(AM) [name], PA-C Taking Active   atorvastatin (LIPITOR) 40 MG tablet [id number] Yes TAKE 1 TABLET (40 MG TOTAL) BY MOUTH DAILY. (BEDTIME) [name], MD Taking Active   bisacodyl (DULCOLAX) 5 MG EC tablet [id number] Yes Take 2 tablets at 12:00 noon and 2 Tablets at 2:00 pm [provider] Taking Active   Blood Glucose Monitoring Suppl (ACCU-CHEK AVIVA PLUS) w/Device KIT [id number] Yes 1 each by Does not apply route 3 (three) times daily. Argentina Donovan, [place] Taking Active Self  budesonide-formoterol (SYMBICORT) 80-4.5 MCG/ACT inhaler [id number] Yes INHALE TWO PUFFS BY MOUTH TWICE A DAY [name], MD Taking Active Self  celecoxib (CELEBREX) 200 MG capsule [id number] Yes TAKE 1 CAPSULE (200 MG TOTAL) BY MOUTH 2 (TWO) TIMES DAILY. (AM+BEDTIME) [name], MD Taking Active   clopidogrel (PLAVIX) 75 MG tablet [id number] Yes TAKE 1 TABLET (75 MG TOTAL) BY MOUTH DAILY. (AM) [name], MD Taking Active   diclofenac Sodium (VOLTAREN) 1 % GEL [id number] Yes 1 application [provider] Taking Active   dicyclomine (BENTYL) 10 MG capsule [id number] Yes Take 10 mg by mouth in the morning, at noon, and at bedtime. [provider] Taking Active Self  docusate sodium (COLACE)  100 MG capsule [id number] No Take 1 capsule (100 mg total) by mouth 2 (two) times daily.  Patient not taking: Reported on 10/21/2021   [name], MD Not Taking Active   DULoxetine (CYMBALTA) 60 MG capsule [id number] Yes TAKE 1 CAPSULE (60 MG TOTAL) BY MOUTH DAILY (AM) [name], MD Taking Active   EPINEPHrine 0.3 mg/0.3 mL IJ SOAJ injection [id number] Yes 0.3 mg IM  x 1 PRN for allergic reaction  Patient taking differently: Inject 0.3 mg into the muscle once as needed for anaphylaxis.   [name], MD Taking Active Self  ferrous gluconate (FERGON) 324 MG tablet [id number] No Take 1 tablet (324 mg total) by mouth 2 (two) times daily with a meal.  Patient not taking: Reported on 10/21/2021   [name], MD Not Taking Active   ferrous sulfate ([name]) 325 (65 FE) MG tablet [id number] Yes TAKE ONE TABLET ( 325 MG ) BY MOUTH DAILY (AM) [name], MD Taking Active   FLOWFLEX COVID-19 AG HOME TEST KIT [id number]  See admin instructions. [provider]  Active Self  fluticasone (FLONASE) 50 MCG/ACT nasal spray [id number] Yes Place 1 spray into both nostrils daily. [name], MD Taking Active Self    Discontinued 04/16/11 [phone] gabapentin (NEURONTIN) 600 MG tablet [id number] Yes 1 capsule [provider] Taking Active   Glucagon 1 MG/0.2ML SOSY [id number] Yes 1 mg arrange subcutaneously as needed for severe symptomatic low blood sugar [name], MD Taking Active   glucose blood (ACCU-CHEK AVIVA PLUS) test strip [id number] Yes Use as instructed [name], MD Taking Active   glucose chewable tablet 12 g [id number]   [name], MD  Active   glycopyrrolate (ROBINUL) 2 MG tablet [id number] Yes Take 2 mg by mouth 3 (three) times daily. [provider] Taking Active   hydrochlorothiazide (HYDRODIURIL) 25 MG tablet [id number] Yes 1 tablet in the morning [provider] Taking Active   HYDROcodone-acetaminophen (NORCO/VICODIN) 5-325 MG tablet [id number] No 1 tablet as needed  Patient not taking: Reported on 10/21/2021   [provider] Not Taking Active   hydrOXYzine (ATARAX) 10 MG tablet [id number] Yes TAKE 1 TABLET IN THE MORNING AND NOON AND 2 TABLETS IN THE EVENING AS NEEDED. [name], MD Taking Active   Lancets [facility] SOFT [facility]) lancets [id number] Yes Use as instructed  [name] Taking Active Self    Discontinued 04/16/11 0916 (Error) loperamide (IMODIUM) 2 MG capsule [id number] Yes TAKE 1 CAPSULE (2 MG TOTAL) BY MOUTH 4 (FOUR) TIMES DAILY AS NEEDED FOR DIARRHEA OR LOOSE STOOLS. [name], MD Taking Active Self  loratadine (CLARITIN) 10 MG tablet [id number] Yes TAKE 1 TABLET (10 MG TOTAL) BY MOUTH DAILY. (AM) [name], MD Taking Active   melatonin 5 MG TABS [id number] Yes 1 tablet in the evening [provider] Taking Active   meloxicam (MOBIC) 7.5 MG tablet [id number] Yes 1 tablet [provider] Taking Active   metaxalone (SKELAXIN) 800 MG tablet [id number] No Take 1 tablet by mouth 3 (three) times daily.  Patient not taking: Reported on 10/21/2021   [provider] Not Taking Active   metFORMIN (GLUCOPHAGE) 850 MG tablet [id number] No 1 tablet with a meal  Patient not taking: Reported on 10/21/2021   [provider] Not Taking Consider Medication Status and Discontinue (Patient Preference)   metFORMIN (GLUCOPHAGE-XR) 750 MG 24 hr tablet [id number] Yes TAKE 1 TABLET (750 MG TOTAL)  BY MOUTH 2 (TWO) TIMES DAILY (AM+BEDTIME) [name], MD Taking Active   methocarbamol (ROBAXIN) 500 MG tablet [id number] Yes Take 1 tablet (500 mg total) by mouth every 8 (eight) hours as needed for muscle spasms. [name], MD Taking Consider Medication Status and Discontinue (Patient Preference)   methocarbamol (ROBAXIN) 750 MG tablet [id number] Yes 1 tablet [name], MD Taking Active   metoCLOPramide (REGLAN) 5 MG tablet [id number] Yes Take 5 mg by mouth 3 (three) times daily as needed for nausea or vomiting. Take 1 tablet by mouth three times a day before meals as needed for nausea and vomiting. [provider] Taking Active Self  montelukast (SINGULAIR) 10 MG tablet [id number] No Take 10 mg by mouth at bedtime.   Patient not taking: Reported on 10/21/2021   [provider] Not Taking  Active Self  montelukast (SINGULAIR) 10 MG tablet [id number] Yes Take 1 tablet by mouth daily. [provider] Taking Active   Multiple Vitamins-Minerals (CENTRUM SILVER 50+WOMEN PO) [id number] Yes Take 1 tablet by mouth daily. [provider] Taking Active Self  nicotine (NICODERM CQ - DOSED IN MG/24 HOURS) 14 mg/24hr patch [id number] No 1 patch to skin  Patient not taking: Reported on 10/21/2021   [provider] Not Taking Active   nicotine (NICODERM CQ - DOSED IN MG/24 HOURS) 14 mg/24hr patch [id number] No PLACE 1 PATCH ONTO THE SKIN DAILY  Patient not taking: Reported on 10/21/2021   [name], MD Not Taking Active   ofloxacin (OCUFLOX) 0.3 % ophthalmic solution [id number] No Place 1 drop into the right eye 4 (four) times daily.  Patient not taking: Reported on 10/21/2021   [provider] Not Taking Active Self  oxyCODONE (OXY IR/ROXICODONE) 5 MG immediate release tablet [id number] No Take 1 tablet (5 mg total) by mouth every 6 (six) hours as needed for moderate pain ((score 4 to 6)).  Patient not taking: Reported on 10/21/2021   [name], PA-C Not Taking Active   oxyCODONE (OXYCONTIN) 15 mg 12 hr tablet [id number] No Take 1 tablet (15 mg total) by mouth every 12 (twelve) hours.  Patient not taking: Reported on 10/21/2021   [name], PA-C Not Taking Active   pantoprazole (PROTONIX) 40 MG tablet [id number] Yes Take 1 tablet (40 mg total) by mouth daily. [name], MD Taking Active Self  permethrin (ELIMITE) 5 % cream [id number] Yes Apply to all areas of the body from the neck to soles of feet leave on for 8 to 14 hours before removing by washing (shower or bath). Gildardo Pounds, NP Taking Active   polyethylene glycol (MIRALAX / GLYCOLAX) 17 g packet [id number] Yes Take 17 g by mouth daily as needed for moderate constipation. [provider] Taking Active Self  potassium chloride SA (KLOR-CON M) 20 MEQ tablet [id number] Yes TAKE  ONE TABLET BY MOUTH ONCE DAILY (NOON) [name], MD Taking Active   promethazine (PHENERGAN) 12.5 MG tablet [id number] Yes Take 1 tablet (12.5 mg total) by mouth 2 (two) times daily as needed for nausea or vomiting. [name], MD Taking Active   Respiratory Therapy Supplies (FLUTTER) DEVI [id number] Yes Use after breathing treatment 4 times daily Elsie Stain, MD Taking Active Self  sucralfate (CARAFATE) 1 g tablet [id number] Yes Take 1 g by mouth 4 (four) times daily -  with meals and at bedtime. [provider] Taking Active Self  tiotropium (SPIRIVA HANDIHALER) 18 MCG inhalation capsule  [id number] Yes PLACE 1 CAPSULE (18 MCG TOTAL) INTO INHALER AND INHALE DAILY. [name], MD Taking Active   tiZANidine (ZANAFLEX) 2 MG tablet [id number] Yes Take 2 mg by mouth 3 (three) times daily. [provider] Taking Active   tobramycin-dexamethasone [facility]) ophthalmic ointment [id number] No 1 application for L eye  Patient not taking: Reported on 10/21/2021   [provider] Not Taking Active   traMADol (ULTRAM) 50 MG tablet [id number] Yes 1 tablet as needed [provider] Taking Active   triamcinolone cream (KENALOG) 0.1 % [id number] Yes Apply 1 application topically 2 (two) times daily. Mayers, Cari S, PA-C Taking Active Self  triamcinolone cream (KENALOG) 0.1 % [id number] Yes 1 application [provider] Taking Active   valsartan (DIOVAN) 160 MG tablet [id number] No   Patient not taking: Reported on 10/21/2021   [provider] Not Taking Active   valsartan-hydrochlorothiazide (DIOVAN-HCT) 160-12.5 MG tablet [id number] Yes TAKE ONE TABLET BY MOUTH ONCE DAILY (AM) [name], MD Taking Active               Assessment/Plan:   {Pharmacy A/P Choices:26421}  Follow Up Plan: ***  ***

## 2021-10-31 ENCOUNTER — Ambulatory Visit: Payer: Medicaid Other | Admitting: Specialist

## 2021-10-31 NOTE — Patient Instructions (Addendum)
Eriyana,   It was great talking to you today!  Here is what each medication is for:   Blood sugars/diabetes: metformin XR 750 mg twice daily   Blood pressure/hypertension: valsartan/HCTZ 160/12.5 mg daily  Cholesterol, Preventing heart attacks or strokes: atorvastatin 40 mg daily, clopidogrel 75 mg daily  Asthma/Allergies: Symbicort 160/4.5 mcg 2 puffs twice daily, Spiriva 18 mcg daily, montelukast 10 mg daily, loratadine 10 mg daily, fluticasone nasal spray, albuterol nebulizer as needed- reports using 1-2 times weekly; fluticasone nasal spray daily PRN   Gout prevention: allopurinol 100 mg daily  Acid Reflux: pantoprazole 40 mg twice daily, sucralfate 1 g as needed  Stomach Spasms/IBS: dicyclomine 10 mg as needed  Arthritis/Inflammation Pain: celecoxib 200 mg twice daily  Chronic nerve pain, depression, anxiety: duloxetine 60 mg daily  Anxiety or itching: hydroxyzine 10 mg as needed  Muscle spasms/pain: tizanidine 2 mg as needed  Please let me know what questions or concerns you have!  Catie Hedwig Morton, PharmD, [facility] [phone]

## 2021-11-04 ENCOUNTER — Other Ambulatory Visit: Payer: Self-pay | Admitting: Internal Medicine

## 2021-11-04 ENCOUNTER — Ambulatory Visit: Payer: Medicaid Other | Admitting: Specialist

## 2021-11-04 ENCOUNTER — Other Ambulatory Visit: Payer: Self-pay | Admitting: Specialist

## 2021-11-04 DIAGNOSIS — E1142 Type 2 diabetes mellitus with diabetic polyneuropathy: Secondary | ICD-10-CM

## 2021-11-05 DIAGNOSIS — F331 Major depressive disorder, recurrent, moderate: Secondary | ICD-10-CM | POA: Diagnosis not present

## 2021-11-07 ENCOUNTER — Other Ambulatory Visit: Payer: Self-pay

## 2021-11-07 NOTE — Patient Outreach (Signed)
Care Coordination  11/07/2021  Stacy [name] [facility] 1960-07-12 [id number]  BSW contacted patient and she stated she was going through an emergency with her mother, EMS was getting ready to take her to the hospital and stated she would contact BSW back.   Mickel Fuchs, BSW, [place] Managed Medicaid Team  [phone]

## 2021-11-07 NOTE — Patient Instructions (Signed)
Visit Information  [name]  - as a part of your Medicaid benefit, you are eligible for care management and care coordination services at no cost or copay. I was unable to reach you by phone today but would be happy to help you with your health related needs. Please feel free to call me @ [phone]).   A member of the Managed Medicaid care management team will reach out to you again over the next 7 days.   Mickel Fuchs, BSW, [place] Managed Medicaid Team  [phone]

## 2021-11-11 DIAGNOSIS — K589 Irritable bowel syndrome without diarrhea: Secondary | ICD-10-CM | POA: Diagnosis not present

## 2021-11-11 DIAGNOSIS — Z86010 Personal history of colonic polyps: Secondary | ICD-10-CM | POA: Diagnosis not present

## 2021-11-11 DIAGNOSIS — K219 Gastro-esophageal reflux disease without esophagitis: Secondary | ICD-10-CM | POA: Diagnosis not present

## 2021-11-12 DIAGNOSIS — F331 Major depressive disorder, recurrent, moderate: Secondary | ICD-10-CM | POA: Diagnosis not present

## 2021-11-19 ENCOUNTER — Other Ambulatory Visit: Payer: Self-pay | Admitting: Internal Medicine

## 2021-11-19 ENCOUNTER — Other Ambulatory Visit: Payer: Medicaid Other | Admitting: Pharmacist

## 2021-11-19 DIAGNOSIS — K582 Mixed irritable bowel syndrome: Secondary | ICD-10-CM

## 2021-11-19 DIAGNOSIS — F331 Major depressive disorder, recurrent, moderate: Secondary | ICD-10-CM | POA: Diagnosis not present

## 2021-11-19 DIAGNOSIS — K589 Irritable bowel syndrome without diarrhea: Secondary | ICD-10-CM | POA: Insufficient documentation

## 2021-11-19 NOTE — Progress Notes (Signed)
11/19/2021 Name: Stacy [name] [facility] MRN: [id number] DOB: 1960-11-18  Chief Complaint  Patient presents with   Medication Management   Diabetes   Hypertension    Stacy [name] is a 61 y.o. year old female who presented for a telephone visit.   They were referred to the pharmacist by their Case Management Team  for assistance in managing complex medication management.   Patient is participating in a Managed Medicaid Plan:  Yes  Subjective:  Care Team: Primary Care Provider: [name], MD ; Next Scheduled Visit: 01/05/2022 Neurology: Posey Pronto; Next Scheduled Visit: 01/12/22 Pain Management/Ortho: [name]; Next Scheduled Visit: 11/20/2021  Medication Access/Adherence  Current Pharmacy:  [place], [place] - [address] [place] [address] Phone: [phone] Fax: [facility] [place], [place] [address] [place] [address] Phone: [phone] Fax: [phone]   Patient reports affordability concerns with their medications: No  Patient reports access/transportation concerns to their pharmacy: No  Patient reports adherence concerns with their medications:  Yes - reports missing ~3 doses a month of medications. Continues to use pill packs.    Diabetes:  Current medications: metformin 750 mg BID  Current glucose readings: 120s, 170s after meals. No readings >200 mg/dL Patient denies hypoglycemic s/sx including dizziness, shakiness, sweating. Denies hypoglycemic events since discontinuing glimepiride   Hypertension:   Current medications: valsartan-HCTZ 160-12.5 mg once daily   Patient has a validated, automated, upper arm home BP cuff Current blood pressure readings: 140-150/70-80s, occasional SBP in 130s Patient denies hypertensive symptoms including headache, chest pain, shortness of  breath  Health Maintenance  Health Maintenance Due  Topic Date Due   COVID-19 Vaccine (5 - Pfizer risk series) 04/09/2020   FOOT EXAM  06/06/2021   TETANUS/TDAP  09/06/2021   Zoster Vaccines- Shingrix (2 of 2) 10/28/2021     Objective: Lab Results  Component Value Date   HGBA1C 6.3 09/02/2021    Lab Results  Component Value Date   CREATININE 1.06 (H) 03/26/2021   BUN 11 03/26/2021   NA 132 (L) 03/26/2021   K 3.8 03/26/2021   CL 95 (L) 03/26/2021   CO2 28 03/26/2021    Lab Results  Component Value Date   CHOL 160 01/20/2021   HDL 49 01/20/2021   LDLCALC 94 01/20/2021   TRIG 91 01/20/2021   CHOLHDL 3.3 01/20/2021    Medications Reviewed Today     Reviewed by Osker Mason, RPH-CPP (Pharmacist) on 10/30/21 at 1612  Med List Status: <None>   Medication Order Taking? Sig Documenting Provider Last Dose Status Informant  Accu-Chek Softclix Lancets lancets [id number] Yes USE AS INSTRUCTED 3 TIMES A DAY [name], MD Taking Active Self  albuterol (PROVENTIL) (2.5 MG/3ML) 0.083% nebulizer solution [id number] Yes USE ONE VIAL (2.5 MG TOTAL) BY NEBULIZATION EVERY 6 (SIX) HOURS AS NEEDED FOR WHEEZING. [name], MD Taking Active            Med Note [name], [name] Oct 30, 2021  3:51 PM) Using 1-2 times weekly  allopurinol (ZYLOPRIM) 100 MG tablet [id number] Yes TAKE 1 TABLET (100 MG TOTAL) BY MOUTH DAILY.(AM) [name], PA-C Taking Active   atorvastatin (LIPITOR) 40 MG tablet [id number] Yes TAKE 1 TABLET (40 MG TOTAL) BY MOUTH DAILY. (BEDTIME) [name], MD Taking Active  Blood Glucose Monitoring Suppl (ACCU-CHEK AVIVA PLUS) w/Device KIT [id number] Yes 1 each by Does not apply route 3 (three) times daily. Argentina Donovan, [place] Taking Active Self  celecoxib (CELEBREX) 200 MG capsule [id number] Yes TAKE 1 CAPSULE (200 MG TOTAL) BY MOUTH 2 (TWO) TIMES DAILY. (AM+BEDTIME) [name], MD Taking Active   clopidogrel (PLAVIX) 75 MG  tablet [id number] Yes TAKE 1 TABLET (75 MG TOTAL) BY MOUTH DAILY. (AM) [name], MD Taking Active   diclofenac Sodium (VOLTAREN) 1 % GEL [id number] Yes Apply 2 g topically 4 (four) times daily. [provider] Taking Active   dicyclomine (BENTYL) 10 MG capsule [id number] Yes Take 10 mg by mouth in the morning, at noon, and at bedtime. [provider] Taking Active Self  DULoxetine (CYMBALTA) 60 MG capsule [id number] Yes TAKE 1 CAPSULE (60 MG TOTAL) BY MOUTH DAILY (AM) [name], MD Taking Active   EPINEPHrine 0.3 mg/0.3 mL IJ SOAJ injection [id number] No 0.3 mg IM x 1 PRN for allergic reaction  Patient not taking: Reported on 10/30/2021   [name], MD Not Taking Active Self  fluticasone (FLONASE) 50 MCG/ACT nasal spray [id number] Yes Place 1 spray into both nostrils daily. [name], MD Taking Active Self    Discontinued 04/16/11 0943 Glucagon 1 MG/0.2ML SOSY [id number] No 1 mg arrange subcutaneously as needed for severe symptomatic low blood sugar  Patient not taking: Reported on 10/30/2021   [name], MD Not Taking Active   glucose blood (ACCU-CHEK AVIVA PLUS) test strip [id number] Yes Use as instructed [name], MD Taking Active   glucose chewable tablet 12 g [id number]   [name], MD  Consider Medication Status and Discontinue (Patient Preference)   hydrOXYzine (ATARAX) 10 MG tablet [id number] Yes TAKE 1 TABLET IN THE MORNING AND NOON AND 2 TABLETS IN THE EVENING AS NEEDED. [name], MD Taking Active   Lancets [facility] SOFT [facility]) lancets [id number] Yes Use as instructed [name] Taking Active Self    Discontinued 04/16/11 0916 (Error) loratadine (CLARITIN) 10 MG tablet [id number] Yes TAKE 1 TABLET (10 MG TOTAL) BY MOUTH DAILY. (AM) [name], MD Taking Active   melatonin 5 MG TABS [id number] Yes 1 tablet in the evening [provider] Taking Active   metFORMIN (GLUCOPHAGE-XR) 750 MG  24 hr tablet [id number] Yes TAKE 1 TABLET (750 MG TOTAL) BY MOUTH 2 (TWO) TIMES DAILY (AM+BEDTIME) [name], MD Taking Active   metoCLOPramide (REGLAN) 5 MG tablet [id number] No Take 5 mg by mouth 3 (three) times daily as needed for nausea or vomiting. Take 1 tablet by mouth three times a day before meals as needed for nausea and vomiting. [provider] Unknown Active Self  montelukast (SINGULAIR) 10 MG tablet [id number] Yes Take 1 tablet by mouth daily. [provider] Taking Active   Multiple Vitamins-Minerals (CENTRUM SILVER 50+WOMEN PO) [id number] Yes Take 1 tablet by mouth daily. [provider] Taking Active Self  nicotine (NICODERM CQ - DOSED IN MG/24 HOURS) 14 mg/24hr patch [id number] No 1 patch to skin  Patient not taking: Reported on 10/21/2021   [provider] Not Taking Active   nicotine (NICODERM CQ - DOSED IN MG/24 HOURS) 14 mg/24hr patch [id number] No PLACE 1 PATCH ONTO THE SKIN DAILY  Patient not taking: Reported on 10/21/2021   [name], MD Not Taking Active   pantoprazole (PROTONIX) 40 MG tablet [id number] Yes Take 40 mg by  mouth 2 (two) times daily. [provider] Taking Active   polyethylene glycol (MIRALAX / GLYCOLAX) 17 g packet [id number]  Take 17 g by mouth daily as needed for moderate constipation. [provider]  Active Self  potassium chloride SA (KLOR-CON M) 20 MEQ tablet [id number] Yes TAKE ONE TABLET BY MOUTH ONCE DAILY (NOON) [name], MD Taking Active   Respiratory Therapy Supplies (FLUTTER) DEVI [id number]  Use after breathing treatment 4 times daily Elsie Stain, MD  Active Self  sucralfate (CARAFATE) 1 g tablet [id number] Yes Take 1 g by mouth 4 (four) times daily -  with meals and at bedtime. [provider] Taking Active Self  SYMBICORT 160-4.5 MCG/ACT inhaler [id number]  Inhale 2 puffs into the lungs 2 (two) times daily. [provider]  Active   tiotropium (SPIRIVA  HANDIHALER) 18 MCG inhalation capsule [id number] Yes PLACE 1 CAPSULE (18 MCG TOTAL) INTO INHALER AND INHALE DAILY. [name], MD Taking Active   tiZANidine (ZANAFLEX) 2 MG tablet [id number] Yes Take 2 mg by mouth 3 (three) times daily. [provider] Taking Active   traMADol (ULTRAM) 50 MG tablet [id number] No 1 tablet as needed  Patient not taking: Reported on 10/30/2021   [provider] Not Taking Active   triamcinolone cream (KENALOG) 0.1 % [id number] Yes Apply 1 application topically 2 (two) times daily. Mayers, Cari S, PA-C Taking Active Self  valsartan-hydrochlorothiazide (DIOVAN-HCT) 160-12.5 MG tablet [id number] Yes TAKE ONE TABLET BY MOUTH ONCE DAILY (AM) [name], MD Taking Active              Assessment/Plan:   Diabetes: - Currently controlled based on A1c (6.3%) - Reviewed long term cardiovascular and renal outcomes of uncontrolled blood sugar - Reviewed goal A1c, goal fasting, and goal 2 hour post prandial glucose - Recommend to continue metformin 750 mg BID.  - Recommend to check glucose daily.    Hypertension: - Currently uncontrolled based on home BP readings.  - Reviewed long term cardiovascular and renal outcomes of uncontrolled blood pressure - Reviewed appropriate blood pressure monitoring technique and reviewed goal blood pressure. Recommended to check home blood pressure and heart rate daily - Consider increasing valsartan-HCTZ to 320-25 mg daily if BP is not controlled at follow up.  Last BMET 03/26/2021  Discussed importance of medication adherence, contacting pharmacy before she is due for refills.   Follow Up Plan: continue to collaborate with RN CM  Catie Hedwig Morton, PharmD, [facility] [phone]

## 2021-11-20 ENCOUNTER — Ambulatory Visit (INDEPENDENT_AMBULATORY_CARE_PROVIDER_SITE_OTHER): Payer: Medicaid Other

## 2021-11-20 ENCOUNTER — Encounter: Payer: Self-pay | Admitting: Specialist

## 2021-11-20 ENCOUNTER — Ambulatory Visit (INDEPENDENT_AMBULATORY_CARE_PROVIDER_SITE_OTHER): Payer: Medicaid Other | Admitting: Specialist

## 2021-11-20 VITALS — BP 129/81 | HR 89 | Ht 66.0 in | Wt 154.0 lb

## 2021-11-20 DIAGNOSIS — Z981 Arthrodesis status: Secondary | ICD-10-CM

## 2021-11-20 NOTE — Progress Notes (Signed)
I believe that you sent this to the wrong provider. I am a pediatric provider.   Thank you.

## 2021-11-20 NOTE — Progress Notes (Signed)
Office Visit Note   Patient: Stacy [name]           Date of Birth: 10-19-1960           MRN: [id number] Visit Date: 11/20/2021              Requested by: Stacy Matar, MD [address] [place],  [place] [address] PCP: Stacy Matar, MD   Assessment & Plan: Visit Diagnoses:  1. S/P lumbar fusion     Plan: Avoid frequent bending and stooping  No lifting greater than 10 lbs. May use ice or moist heat for pain. Weight loss is of benefit. Best medication for lumbar disc disease is arthritis medications you are not able to take meds like motrin, celebrex and naprosyn due to allergy to aspirin and celebrex.  Exercise is important to improve your indurance and does allow people to function better inspite of back pain.    Follow-Up Instructions: No follow-ups on file.   Orders:  Orders Placed This Encounter  Procedures   XR Lumb Spine Flex&Ext Only   No orders of the defined types were placed in this encounter.     Procedures: No procedures performed   Clinical Data: No additional findings.   Subjective: Chief Complaint  Patient presents with   Lower Back - Follow-up    61 year old female with history of 3 level TLIF L2-3, L3-4 and L4-5 in 03/2021. She is having some stiffness with ROM and avoiding heavy lifting. Walking daily up to 10K steps. She is taking cymbalta. And also methocarbamol for discomfort. No bowel or bladder discomfort.     Review of Systems  Constitutional: Negative.   HENT: Negative.    Eyes: Negative.   Respiratory: Negative.    Cardiovascular: Negative.   Gastrointestinal: Negative.   Endocrine: Negative.   Genitourinary: Negative.   Musculoskeletal: Negative.   Skin: Negative.   Allergic/Immunologic: Negative.   Neurological: Negative.   Hematological: Negative.   Psychiatric/Behavioral: Negative.       Objective: Vital Signs: BP 129/81   Pulse 89   Ht 5\' 6"  (1.676 m)   Wt 154 lb (69.9 kg)   LMP  09/01/2010   BMI 24.86 kg/m   Physical Exam Constitutional:      Appearance: She is well-developed.  HENT:     Head: Normocephalic and atraumatic.  Eyes:     Pupils: Pupils are equal, round, and reactive to light.  Pulmonary:     Effort: Pulmonary effort is normal.     Breath sounds: Normal breath sounds.  Abdominal:     General: Bowel sounds are normal.     Palpations: Abdomen is soft.  Musculoskeletal:     Cervical back: Normal range of motion and neck supple.     Lumbar back: Negative right straight leg raise test and negative left straight leg raise test.  Skin:    General: Skin is warm and dry.  Neurological:     Mental Status: She is alert and oriented to person, place, and time.  Psychiatric:        Behavior: Behavior normal.        Thought Content: Thought content normal.        Judgment: Judgment normal.     Back Exam   Tenderness  The patient is experiencing tenderness in the lumbar.  Range of Motion  Extension:  abnormal  Flexion:  abnormal  Lateral bend right:  abnormal  Lateral bend left:  abnormal  Rotation right:  abnormal  Rotation left:  abnormal   Muscle Strength  Right Quadriceps:  5/5  Left Quadriceps:  5/5  Right Hamstrings:  5/5  Left Hamstrings:  5/5   Tests  Straight leg raise right: negative Straight leg raise left: negative  Other  Toe walk: normal Heel walk: normal  Comments:  Motor without focal deficit      Specialty Comments:  No specialty comments available.  Imaging: No results found.   PMFS History: Patient Active Problem List   Diagnosis Date Noted   Herniation of lumbar intervertebral disc with radiculopathy 10/02/2016    Priority: High    Class: Chronic   Chondromalacia of both patellae 06/03/2015    Priority: High    Class: Chronic   IBS (irritable bowel syndrome) 11/19/2021   Spondylolisthesis, lumbar region    Other spondylosis with radiculopathy, lumbar region    Other secondary scoliosis,  lumbar region    Fusion of spine of lumbar region 03/25/2021   Colon polyps 06/06/2020   Lupus (HCC) 06/06/2020   Allergic rhinitis due to animal (cat) (dog) hair and dander 04/08/2020   Allergic rhinitis due to pollen 04/08/2020   Food allergy 04/08/2020   Acute medial meniscus tear, left, subsequent encounter 03/21/2020   Chronic pain of left knee 02/15/2020   Paresthesia of skin 11/16/2019   History of total knee replacement, right 11/16/2019   Tobacco abuse 11/16/2019   Centrilobular emphysema (HCC) 08/10/2019   Incidental lung nodule, > 55mm and < 86mm 08/10/2019   OSA on CPAP 08/10/2019   Dyspnea on exertion 08/02/2019   Hyperlipidemia 08/02/2019   Lumbar radiculopathy 04/24/2019   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   Post laminectomy syndrome 02/23/2019   Abnormality of gait 02/23/2019   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   Unilateral primary osteoarthritis, left hip 12/28/2018   Lesion of skin of left ear 12/26/2018   Primary osteoarthritis of left hip 12/02/2018   Iron deficiency anemia 10/16/2018   Chronic pain syndrome 09/08/2018   Chronic pain of right knee 08/11/2018   Status post lumbar laminectomy 07/15/2018   Peripheral arterial disease (HCC) 04/05/2018   Moderate persistent asthma without complication 06/29/2017   Environmental and seasonal allergies 06/29/2017   Controlled type 2 diabetes mellitus with diabetic polyneuropathy, without long-term current use of insulin (HCC) 06/29/2017   Perennial allergic rhinitis 04/08/2017   Sensorineural hearing loss (SNHL), bilateral 04/08/2017   Chronic pansinusitis 03/25/2017   Eustachian tube dysfunction, bilateral 03/25/2017   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   Alopecia areata 08/19/2016   Spinal stenosis, lumbar region, with neurogenic claudication 06/03/2015   Tobacco use disorder 04/25/2015   DJD (degenerative joint disease) of knee 01/04/2015   Hemorrhoid 11/14/2014   Gout of big toe 07/19/2014   Essential  hypertension 08/14/2013   Gastroesophageal reflux disease without esophagitis 08/14/2013   COPD (chronic obstructive pulmonary disease) (HCC) 04/17/2011   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   Asthma    COPD (chronic obstructive pulmonary disease) (HCC)    Depression    Diabetes mellitus    Type II   GERD (gastroesophageal reflux disease)    Pt on Protonix daily   Glaucoma    Gout    Headache(784.0)    otc meds prn   Hyperlipidemia    Hypertension  Irritable bowel syndrome 11/19/2010   Neuropathy    Pneumonia YRS AGO   Restless legs    Shortness of breath    07/14/2018- uses  4 times a day   Sleep apnea     Family History  Problem Relation Age of Onset   Hypertension Father    Cancer Father    Heart disease Mother    Asthma Son        had as a child   Heart disease Sister    Breast cancer Sister    Hypertension Brother     Past Surgical History:  Procedure Laterality Date   CHOLECYSTECTOMY     COLONOSCOPY     ENDOMETRIAL ABLATION  10/2010   HERNIA REPAIR     umbicial hernia   JOINT REPLACEMENT Left 03/14/2019   Dr. Ninfa Linden hip   KNEE ARTHROSCOPY Left    06/07/2017 Dr. Marlou Sa of [place] ARTHROSCOPY Left 03/21/2020   Procedure: LEFT KNEE ARTHROSCOPY WITH PARTIAL MEDIAL MENISCECTOMY;  Surgeon: Mcarthur Rossetti, MD;  Location: [place];  Service: Orthopedics;  Laterality: Left;   KNEE CLOSED REDUCTION Right 12/06/2015   Procedure: CLOSED MANIPULATION RIGHT KNEE;  Surgeon: [name], MD;  Location: [place];  Service: Orthopedics;  Laterality: Right;   KNEE CLOSED REDUCTION Right 01/17/2016   Procedure: CLOSED MANIPULATION RIGHT KNEE;  Surgeon: [name], MD;  Location: [place];  Service: Orthopedics;  Laterality: Right;   KNEE JOINT MANIPULATION Right 12/06/2015   LACRIMAL TUBE INSERTION Bilateral  03/01/2019   Procedure: LACRIMAL TUBE INSERTION;  Surgeon: Gevena Cotton, MD;  Location: [facility];  Service: Ophthalmology;  Laterality: Bilateral;   LACRIMAL TUBE REMOVAL Bilateral 05/03/2019   Procedure: BILATERAL NASOLACRIMAL DUCT PROBING, IIRIGATION AND TUBE REMOVAL BOTH EYES;  Surgeon: Gevena Cotton, MD;  Location: [place];  Service: Ophthalmology;  Laterality: Bilateral;   LUMBAR LAMINECTOMY/DECOMPRESSION MICRODISCECTOMY N/A 06/03/2015   Procedure: Bilateral lateral recess decompression L2-3, L3-4, L4-5;  Surgeon: [name], MD;  Location: [place];  Service: Orthopedics;  Laterality: N/A;   LUMBAR LAMINECTOMY/DECOMPRESSION MICRODISCECTOMY N/A 10/02/2016   Procedure: Right L5-S1 Lateral Recess Decompression  microdiscectomy;  Surgeon: [name], MD;  Location: [place];  Service: Orthopedics;  Laterality: N/A;   LUMBAR LAMINECTOMY/DECOMPRESSION MICRODISCECTOMY N/A 07/15/2018   Procedure: LEFT L3-4 MICRODISCECTOMY;  Surgeon: [name], MD;  Location: [place];  Service: Orthopedics;  Laterality: N/A;   svd      x 2   TEAR DUCT PROBING Bilateral 03/01/2019   Procedure: TEAR DUCT PROBING WITH IRRIGATION;  Surgeon: Gevena Cotton, MD;  Location: [facility];  Service: Ophthalmology;  Laterality: Bilateral;   TOTAL HIP ARTHROPLASTY Left 03/14/2019   Procedure: LEFT TOTAL HIP ARTHROPLASTY ANTERIOR APPROACH;  Surgeon: Mcarthur Rossetti, MD;  Location: [place];  Service: Orthopedics;  Laterality: Left;   TOTAL KNEE ARTHROPLASTY Right 09/06/2015   Procedure: RIGHT TOTAL KNEE ARTHROPLASTY;  Surgeon: [name], MD;  Location: [place];  Service: Orthopedics;  Laterality: Right;   TUBAL LIGATION     UPPER GASTROINTESTINAL ENDOSCOPY  04/28/2011   Social History   Occupational History   Occupation: unemployed    Fish farm manager: UNEMPLOYED  Tobacco Use   Smoking status: Some Days    Packs/day: 0.25    Years: 32.00    Total pack  years: 8.00    Types: Cigarettes   Smokeless tobacco: Never   Tobacco comments:  About 1-2 cigarettes a day  Vaping Use   Vaping Use: Never used  Substance and Sexual Activity   Alcohol use: No   Drug use: No   Sexual activity: Yes    Birth control/protection: Surgical, Post-menopausal    Comment: tubal ligation

## 2021-11-20 NOTE — Patient Instructions (Signed)
Plan: Avoid frequent bending and stooping  No lifting greater than 10 lbs. May use ice or moist heat for pain. Weight loss is of benefit. Best medication for lumbar disc disease is arthritis medications you are not able to take meds like motrin, celebrex and naprosyn due to allergy to aspirin and celebrex.  Exercise is important to improve your indurance and does allow people to function better inspite of back pain.

## 2021-11-21 ENCOUNTER — Encounter: Payer: Self-pay | Admitting: Podiatry

## 2021-11-21 ENCOUNTER — Ambulatory Visit: Payer: Medicaid Other | Admitting: Podiatry

## 2021-11-21 DIAGNOSIS — B351 Tinea unguium: Secondary | ICD-10-CM

## 2021-11-21 DIAGNOSIS — M2012 Hallux valgus (acquired), left foot: Secondary | ICD-10-CM

## 2021-11-21 DIAGNOSIS — M2011 Hallux valgus (acquired), right foot: Secondary | ICD-10-CM | POA: Diagnosis not present

## 2021-11-21 DIAGNOSIS — E1142 Type 2 diabetes mellitus with diabetic polyneuropathy: Secondary | ICD-10-CM | POA: Diagnosis not present

## 2021-11-21 DIAGNOSIS — E119 Type 2 diabetes mellitus without complications: Secondary | ICD-10-CM

## 2021-11-21 DIAGNOSIS — G4733 Obstructive sleep apnea (adult) (pediatric): Secondary | ICD-10-CM | POA: Diagnosis not present

## 2021-11-21 DIAGNOSIS — I739 Peripheral vascular disease, unspecified: Secondary | ICD-10-CM

## 2021-11-21 DIAGNOSIS — J449 Chronic obstructive pulmonary disease, unspecified: Secondary | ICD-10-CM | POA: Diagnosis not present

## 2021-11-21 DIAGNOSIS — M79676 Pain in unspecified toe(s): Secondary | ICD-10-CM | POA: Diagnosis not present

## 2021-11-21 NOTE — Progress Notes (Unsigned)
ANNUAL DIABETIC FOOT EXAM  Subjective: Stacy [name] presents today {jgcomplaint:23593}.  Chief Complaint  Patient presents with   Nail Problem    Diabetic foot care BS- did not check today A1C-5.8 PCP-Deborah Johnson  PCP VST-09/02/2021   Patient confirms h/o diabetes.  Patient relates {Numbers; 0-100:15068} year h/o diabetes.  Patient denies any h/o foot wounds.  Patient has h/o foot ulcer of {jgPodToeLocator:23637}, which healed via help of ***.  Patient admits symptoms of foot numbness.   Patient admits symptoms of foot tingling.  Patient admits symptoms of burning in feet.  Patient admits symptoms of pins/needles sensation in feet.  Patient denies any numbness, tingling, burning, or pins/needle sensation in feet.  Patient has been diagnosed with neuropathy and it is managed with {JGNEUROPATHYMEDS:27053}.  Patient's blood sugar was *** mg/dl {Time; today/yesterday/ 2 days ago:19188}. Last known  HgA1c was ***%   Patient did not check blood glucose this morning.  Patient does not monitor blood glucose daily.  Risk factors: {jgriskfactors:24044}.  [name], MD is patient's PCP. Last visit was {Time; dates multiple:15870}***.  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   Asthma    COPD (chronic obstructive pulmonary disease) (HCC)    Depression    Diabetes mellitus    Type II   GERD (gastroesophageal reflux disease)    Pt on Protonix daily   Glaucoma    Gout    Headache(784.0)    otc meds prn   Hyperlipidemia    Hypertension    Irritable bowel syndrome 11/19/2010   Neuropathy    Pneumonia YRS AGO   Restless legs    Shortness of breath    07/14/2018- uses  4 times a day   Sleep apnea    Patient Active Problem List   Diagnosis Date Noted   IBS (irritable bowel syndrome) 11/19/2021   Spondylolisthesis, lumbar  region    Other spondylosis with radiculopathy, lumbar region    Other secondary scoliosis, lumbar region    Fusion of spine of lumbar region 03/25/2021   Colon polyps 06/06/2020   Lupus ([place]) 06/06/2020   Allergic rhinitis due to animal (cat) (dog) hair and dander 04/08/2020   Allergic rhinitis due to pollen 04/08/2020   Food allergy 04/08/2020   Acute medial meniscus tear, left, subsequent encounter 03/21/2020   Chronic pain of left knee 02/15/2020   Paresthesia of skin 11/16/2019   History of total knee replacement, right 11/16/2019   Tobacco abuse 11/16/2019   Centrilobular emphysema ([place]) 08/10/2019   Incidental lung nodule, > 78m and < 823m07/09/2019   OSA on CPAP 08/10/2019   Dyspnea on exertion 08/02/2019   Hyperlipidemia 08/02/2019   Lumbar radiculopathy 04/24/2019   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   Post laminectomy syndrome 02/23/2019   Abnormality of gait 02/23/2019   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   Unilateral primary osteoarthritis, left hip 12/28/2018   Lesion of skin of left ear 12/26/2018   Primary osteoarthritis of left hip 12/02/2018   Iron deficiency anemia 10/16/2018   Chronic pain syndrome 09/08/2018   Chronic pain of right knee 08/11/2018   Status post lumbar laminectomy 07/15/2018   [place]/04/2018   Moderate persistent asthma without complication 0516/11/9602 Environmental and seasonal allergies 06/29/2017   Controlled type 2 diabetes mellitus with diabetic  polyneuropathy, without long-term current use of insulin ([place]) 06/29/2017   Perennial allergic rhinitis 04/08/2017   Sensorineural hearing loss (SNHL), bilateral 04/08/2017   Chronic pansinusitis 03/25/2017   Eustachian tube dysfunction, bilateral 51/88/4166   Lichen planopilaris 08/02/1599   Herniation of lumbar intervertebral disc with radiculopathy 10/02/2016    Class: Chronic   Alopecia areata 08/19/2016   Chondromalacia of both patellae 06/03/2015     Class: Chronic   Spinal stenosis, lumbar region, with neurogenic claudication 06/03/2015   Tobacco use disorder 04/25/2015   DJD (degenerative joint disease) of knee 01/04/2015   Hemorrhoid 11/14/2014   Gout of big toe 07/19/2014   Essential hypertension 08/14/2013   Gastroesophageal reflux disease without esophagitis 08/14/2013   COPD (chronic obstructive pulmonary disease) ([place]) 04/17/2011   Past Surgical History:  Procedure Laterality Date   CHOLECYSTECTOMY     COLONOSCOPY     ENDOMETRIAL ABLATION  10/2010   HERNIA REPAIR     umbicial hernia   JOINT REPLACEMENT Left 03/14/2019   Dr. Ninfa Linden hip   KNEE ARTHROSCOPY Left    06/07/2017 Dr. Marlou Sa of [place] ARTHROSCOPY Left 03/21/2020   Procedure: LEFT KNEE ARTHROSCOPY WITH PARTIAL MEDIAL MENISCECTOMY;  Surgeon: Mcarthur Rossetti, MD;  Location: [place];  Service: Orthopedics;  Laterality: Left;   KNEE CLOSED REDUCTION Right 12/06/2015   Procedure: CLOSED MANIPULATION RIGHT KNEE;  Surgeon: [name], MD;  Location: [place];  Service: Orthopedics;  Laterality: Right;   KNEE CLOSED REDUCTION Right 01/17/2016   Procedure: CLOSED MANIPULATION RIGHT KNEE;  Surgeon: [name], MD;  Location: [place];  Service: Orthopedics;  Laterality: Right;   KNEE JOINT MANIPULATION Right 12/06/2015   LACRIMAL TUBE INSERTION Bilateral 03/01/2019   Procedure: LACRIMAL TUBE INSERTION;  Surgeon: Gevena Cotton, MD;  Location[facility];  Service: Ophthalmology;  Laterality: Bilateral;   LACRIMAL TUBE REMOVAL Bilateral 05/03/2019   Procedure: BILATERAL NASOLACRIMAL DUCT PROBING, IIRIGATION AND TUBE REMOVAL BOTH EYES;  Surgeon: Gevena Cotton, MD;  Location: [place];  Service: Ophthalmology;  Laterality: Bilateral;   LUMBAR LAMINECTOMY/DECOMPRESSION MICRODISCECTOMY N/A 06/03/2015   Procedure: Bilateral lateral recess decompression L2-3, L3-4, L4-5;  Surgeon: [name], MD;   Location: [place];  Service: Orthopedics;  Laterality: N/A;   LUMBAR LAMINECTOMY/DECOMPRESSION MICRODISCECTOMY N/A 10/02/2016   Procedure: Right L5-S1 Lateral Recess Decompression  microdiscectomy;  Surgeon: [name], MD;  Location: [place];  Service: Orthopedics;  Laterality: N/A;   LUMBAR LAMINECTOMY/DECOMPRESSION MICRODISCECTOMY N/A 07/15/2018   Procedure: LEFT L3-4 MICRODISCECTOMY;  Surgeon: [name], MD;  Location: [place];  Service: Orthopedics;  Laterality: N/A;   svd      x 2   TEAR DUCT PROBING Bilateral 03/01/2019   Procedure: TEAR DUCT PROBING WITH IRRIGATION;  Surgeon: Gevena Cotton, MD;  Location: [facility];  Service: Ophthalmology;  Laterality: Bilateral;   TOTAL HIP ARTHROPLASTY Left 03/14/2019   Procedure: LEFT TOTAL HIP ARTHROPLASTY ANTERIOR APPROACH;  Surgeon: Mcarthur Rossetti, MD;  Location: [place];  Service: Orthopedics;  Laterality: Left;   TOTAL KNEE ARTHROPLASTY Right 09/06/2015   Procedure: RIGHT TOTAL KNEE ARTHROPLASTY;  Surgeon: [name], MD;  Location: [place];  Service: Orthopedics;  Laterality: Right;   TUBAL LIGATION     UPPER GASTROINTESTINAL ENDOSCOPY  04/28/2011   Current Outpatient Medications on File Prior to Visit  Medication Sig Dispense Refill   Accu-Chek Softclix Lancets lancets USE AS INSTRUCTED 3 TIMES A DAY  100 each 12   albuterol (PROVENTIL) (2.5 MG/3ML) 0.083% nebulizer solution USE ONE VIAL (2.5 MG TOTAL) BY NEBULIZATION EVERY 6 (SIX) HOURS AS NEEDED FOR WHEEZING. 360 mL 0   allopurinol (ZYLOPRIM) 100 MG tablet TAKE 1 TABLET (100 MG TOTAL) BY MOUTH DAILY.(AM) 90 tablet 3   atorvastatin (LIPITOR) 40 MG tablet TAKE 1 TABLET (40 MG TOTAL) BY MOUTH DAILY. (BEDTIME) 90 tablet 1   Blood Glucose Monitoring Suppl (ACCU-CHEK AVIVA PLUS) w/Device KIT 1 each by Does not apply route 3 (three) times daily. 1 kit 0   clopidogrel (PLAVIX) 75 MG tablet TAKE 1 TABLET (75 MG TOTAL) BY MOUTH DAILY. (AM) 30 tablet 2   diclofenac  Sodium (VOLTAREN) 1 % GEL Apply 2 g topically 4 (four) times daily.     DULoxetine (CYMBALTA) 60 MG capsule TAKE 1 CAPSULE (60 MG TOTAL) BY MOUTH DAILY (AM) 30 capsule 6   EPINEPHrine 0.3 mg/0.3 mL IJ SOAJ injection 0.3 mg IM x 1 PRN for allergic reaction (Patient not taking: Reported on 10/30/2021) 1 Device 1   [name] 325 (65 Fe) MG tablet Take 325 mg by mouth daily.     fluticasone (FLONASE) 50 MCG/ACT nasal spray Place 1 spray into both nostrils daily. 16 g 6   Glucagon 1 MG/0.2ML SOSY 1 mg arrange subcutaneously as needed for severe symptomatic low blood sugar (Patient not taking: Reported on 10/30/2021) 0.2 mL 0   glucose blood (ACCU-CHEK AVIVA PLUS) test strip Use as instructed 100 each 12   glycopyrrolate (ROBINUL) 2 MG tablet Take 2 mg by mouth 3 (three) times daily.     hydrOXYzine (ATARAX) 10 MG tablet TAKE 1 TABLET IN THE MORNING AND NOON AND 2 TABLETS IN THE EVENING AS NEEDED. 120 tablet 2   Lancets (ACCU-CHEK SOFT TOUCH) lancets Use as instructed 100 each 12   loratadine (CLARITIN) 10 MG tablet TAKE 1 TABLET (10 MG TOTAL) BY MOUTH DAILY. (AM) 90 tablet 1   melatonin 5 MG TABS 1 tablet in the evening     metFORMIN (GLUCOPHAGE-XR) 750 MG 24 hr tablet TAKE 1 TABLET (750 MG TOTAL) BY MOUTH 2 (TWO) TIMES DAILY (AM+BEDTIME) 180 tablet 1   methocarbamol (ROBAXIN) 750 MG tablet SMARTSIG:1 Tablet(s) By Mouth Every 12 Hours PRN     montelukast (SINGULAIR) 10 MG tablet Take 1 tablet by mouth daily.     Multiple Vitamins-Minerals (CENTRUM SILVER 50+WOMEN PO) Take 1 tablet by mouth daily.     nicotine (NICODERM CQ - DOSED IN MG/24 HOURS) 14 mg/24hr patch 1 patch to skin (Patient not taking: Reported on 10/21/2021)     nicotine (NICODERM CQ - DOSED IN MG/24 HOURS) 14 mg/24hr patch PLACE 1 PATCH ONTO THE SKIN DAILY (Patient not taking: Reported on 10/21/2021) 15 patch 2   pantoprazole (PROTONIX) 40 MG tablet Take 40 mg by mouth 2 (two) times daily.     polyethylene glycol (MIRALAX / GLYCOLAX) 17 g  packet Take 17 g by mouth daily as needed for moderate constipation.     potassium chloride SA (KLOR-CON M) 20 MEQ tablet TAKE ONE TABLET BY MOUTH ONCE DAILY (NOON) 90 tablet 1   Respiratory Therapy Supplies (FLUTTER) DEVI Use after breathing treatment 4 times daily 1 each 0   sucralfate (CARAFATE) 1 g tablet Take 1 g by mouth 4 (four) times daily -  with meals and at bedtime.     SYMBICORT 160-4.5 MCG/ACT inhaler Inhale 2 puffs into the lungs 2 (two) times daily.     tiotropium (  SPIRIVA HANDIHALER) 18 MCG inhalation capsule PLACE 1 CAPSULE (18 MCG TOTAL) INTO INHALER AND INHALE DAILY. 30 capsule 1   tiZANidine (ZANAFLEX) 2 MG tablet Take 2 mg by mouth 3 (three) times daily.     traMADol (ULTRAM) 50 MG tablet 1 tablet as needed (Patient not taking: Reported on 10/30/2021)     triamcinolone cream (KENALOG) 0.1 % Apply 1 application topically 2 (two) times daily. 30 g 0   valsartan-hydrochlorothiazide (DIOVAN-HCT) 160-12.5 MG tablet TAKE ONE TABLET BY MOUTH ONCE DAILY (AM) 90 tablet 1   [DISCONTINUED] Fluticasone-Salmeterol (ADVAIR) 500-50 MCG/DOSE AEPB Inhale 1 puff into the lungs every 12 (twelve) hours.       [DISCONTINUED] lisinopril (PRINIVIL,ZESTRIL) 40 MG tablet Take 40 mg by mouth daily.       Current Facility-Administered Medications on File Prior to Visit  Medication Dose Route Frequency Provider Last Rate Last Admin   glucose chewable tablet 12 g  3 tablet Oral Once [name], MD        Allergies  Allergen Reactions   Other Shortness Of Breath    UNSPECIFIED AGENTS Allergic to perfumes and cleaning products   Shellfish Allergy Anaphylaxis    Per allergy test.   Ace Inhibitors Cough and Other (See Comments)        Celecoxib      upset stomach  Pt is taking med    Aspirin Nausea Only    stomach upset    Social History   Occupational History   Occupation: unemployed    Fish farm manager: UNEMPLOYED  Tobacco Use   Smoking status: Some Days    Packs/day: 0.25    Years:  32.00    Total pack years: 8.00    Types: Cigarettes   Smokeless tobacco: Never   Tobacco comments:    About 1-2 cigarettes a day  Vaping Use   Vaping Use: Never used  Substance and Sexual Activity   Alcohol use: No   Drug use: No   Sexual activity: Yes    Birth control/protection: Surgical, Post-menopausal    Comment: tubal ligation   Family History  Problem Relation Age of Onset   Hypertension Father    Cancer Father    Heart disease Mother    Asthma Son        had as a child   Heart disease Sister    Breast cancer Sister    Hypertension Brother    Immunization History  Administered Date(s) Administered   Influenza Split 11/21/2014   Influenza Whole 02/03/2011   Influenza,inj,Quad PF,6+ Mos 01/01/2014, 10/25/2014, 10/24/2015, 11/24/2015, 11/23/2016, 11/11/2017, 11/07/2018, 11/22/2019, 11/20/2020, 10/23/2021   PFIZER(Purple Top)SARS-COV-2 Vaccination 04/20/2019, 05/16/2019, 12/02/2019, 02/13/2020   Pneumococcal Conjugate (Pcv15) 01/20/2021   Pneumococcal Polysaccharide-23 06/04/2015   Tdap 09/07/2011   Zoster Recombinat (Shingrix) 09/02/2021     Review of Systems: Negative except as noted in the HPI.   Objective: There were no vitals filed for this visit.  Norrine Ballester Seiple is a pleasant 61 y.o. female in NAD. AAO X 3.  Vascular Examination: {jgvascular:23595}  Dermatological Examination: {jgderm:23598}  Neurological Examination: {jgneuro:23601::"Protective sensation intact 5/5 intact bilaterally with 10g monofilament b/l.","Vibratory sensation intact b/l.","Proprioception intact bilaterally."}  Musculoskeletal Examination: {jgmsk:23600}  Footwear Assessment: Does the patient wear appropriate shoes? {Yes,No}. Does the patient need inserts/orthotics? {Yes,No}.  ADA Risk Categorization: Low Risk :  Patient has all of the following: Intact protective sensation No prior foot ulcer  No severe deformity Pedal pulses present  High Risk  Patient has  one  or more of the following: Loss of protective sensation Absent pedal pulses Severe Foot deformity History of foot ulcer  Assessment: 1. Pain due to onychomycosis of toenail   2. Hallux valgus, acquired, bilateral   3. PAD (peripheral artery disease) ([place])   4. Diabetic peripheral neuropathy associated with type 2 diabetes mellitus ([place])   5. Encounter for diabetic foot exam ([place])      Plan: {jgplan:23602::"-Patient/POA to call should there be question/concern in the interim."} Return in about 3 months (around 02/21/2022).  Marzetta Board, DPM

## 2021-11-24 ENCOUNTER — Other Ambulatory Visit: Payer: Self-pay | Admitting: *Deleted

## 2021-11-24 ENCOUNTER — Other Ambulatory Visit: Payer: Self-pay | Admitting: Internal Medicine

## 2021-11-24 DIAGNOSIS — J449 Chronic obstructive pulmonary disease, unspecified: Secondary | ICD-10-CM

## 2021-11-24 MED ORDER — ACCU-CHEK AVIVA PLUS VI STRP: ORAL_STRIP | 12 refills | Status: DC

## 2021-11-24 MED ORDER — ALBUTEROL SULFATE (2.5 MG/3ML) 0.083% IN NEBU: INHALATION_SOLUTION | RESPIRATORY_TRACT | 2 refills | Status: DC

## 2021-11-24 MED ORDER — ACCU-CHEK MULTICLIX LANCETS MISC: 12 refills | Status: DC

## 2021-11-24 NOTE — Patient Outreach (Signed)
Medicaid Managed Care   Nurse Care Manager Note  11/24/2021 Name:  [name] MRN:  [id number] DOB:  1960-02-28  Stacy [name] is an 61 y.o. year old female who is a primary patient of [name], MD.  The [facility] Managed Care Coordination team was consulted for assistance with:    DMII  [name] was given information about Medicaid Managed Care Coordination team services today. Stacy [name] Patient agreed to services and verbal consent obtained.  Engaged with patient by telephone for follow up visit in response to provider referral for case management and/or care coordination services.   Assessments/Interventions:  Review of past medical history, allergies, medications, health status, including review of consultants reports, laboratory and other test data, was performed as part of comprehensive evaluation and provision of chronic care management services.  SDOH (Social Determinants of Health) assessments and interventions performed: SDOH Interventions    Flowsheet Row Patient Outreach Telephone from 10/21/2021 in [place] Coordination  SDOH Interventions   Housing Interventions Intervention Not Indicated  Transportation Interventions Intervention Not Indicated       Care Plan  Allergies  Allergen Reactions   Other Shortness Of Breath    UNSPECIFIED AGENTS Allergic to perfumes and cleaning products   Shellfish Allergy Anaphylaxis    Per allergy test.   Ace Inhibitors Cough and Other (See Comments)        Celecoxib      upset stomach  Pt is taking med    Aspirin Nausea Only    stomach upset     Medications Reviewed Today     Reviewed by [name], RN (Registered Nurse) on 11/24/21 at 1054  Med List Status: <None>   Medication Order Taking? Sig Documenting Provider Last Dose Status Informant  Accu-Chek Softclix Lancets lancets [id number] No USE AS INSTRUCTED 3 TIMES A DAY  Patient not  taking: Reported on 11/24/2021   [name], MD Not Taking Active Self  albuterol (PROVENTIL) (2.5 MG/3ML) 0.083% nebulizer solution [id number] No USE ONE VIAL (2.5 MG TOTAL) BY NEBULIZATION EVERY 6 (SIX) HOURS AS NEEDED FOR WHEEZING.  Patient not taking: Reported on 11/24/2021   [name], MD Not Taking Active            Med Note [name], [name] Oct 30, 2021  3:51 PM) Using 1-2 times weekly  allopurinol (ZYLOPRIM) 100 MG tablet [id number] Yes TAKE 1 TABLET (100 MG TOTAL) BY MOUTH DAILY.(AM) [name], PA-C Taking Active   atorvastatin (LIPITOR) 40 MG tablet [id number] Yes TAKE 1 TABLET (40 MG TOTAL) BY MOUTH DAILY. (BEDTIME) [name], MD Taking Active   Blood Glucose Monitoring Suppl (ACCU-CHEK AVIVA PLUS) w/Device KIT [id number]  1 each by Does not apply route 3 (three) times daily. [name], [place]  Active Self  clopidogrel (PLAVIX) 75 MG tablet [id number] Yes TAKE 1 TABLET (75 MG TOTAL) BY MOUTH DAILY. (AM) [name], MD Taking Active   diclofenac Sodium (VOLTAREN) 1 % GEL [id number] Yes Apply 2 g topically 4 (four) times daily. [provider] Taking Active   DULoxetine (CYMBALTA) 60 MG capsule [id number] Yes TAKE 1 CAPSULE (60 MG TOTAL) BY MOUTH DAILY (AM) [name], MD Taking Active   EPINEPHrine 0.3 mg/0.3 mL IJ SOAJ injection [id number]  0.3 mg IM x 1 PRN for allergic reaction  Patient not taking: Reported on 10/30/2021   [name], MD  Active Self  [name] 325 (65 Fe) MG tablet [id number] Yes Take 325 mg by mouth daily. [provider] Taking Active   fluticasone (FLONASE) 50 MCG/ACT nasal spray [id number] Yes Place 1 spray into both nostrils daily. [name], MD Taking Active Self    Discontinued 04/16/11 0943 Glucagon 1 MG/0.2ML SOSY [id number]  1 mg arrange subcutaneously as needed for severe symptomatic low blood sugar  Patient not taking: Reported on 10/30/2021   [name], MD  Active    glucose blood (ACCU-CHEK AVIVA PLUS) test strip [id number] No Use as instructed  Patient not taking: Reported on 11/24/2021   [name], MD Not Taking Active   glucose chewable tablet 12 g [id number]   [name], MD  Active   glycopyrrolate (ROBINUL) 2 MG tablet [id number] Yes Take 2 mg by mouth 3 (three) times daily. [provider] Taking Active   hydrOXYzine (ATARAX) 10 MG tablet [id number] Yes TAKE 1 TABLET IN THE MORNING AND NOON AND 2 TABLETS IN THE EVENING AS NEEDED. [name], MD Taking Active   Lancets [facility]) lancets [id number] No Use as instructed  Patient not taking: Reported on 11/24/2021   [name] Not Taking Active Self    Discontinued 04/16/11 0916 (Error) loratadine (CLARITIN) 10 MG tablet [id number] Yes TAKE 1 TABLET (10 MG TOTAL) BY MOUTH DAILY. (AM) [name], MD Taking Active   melatonin 5 MG TABS [id number] Yes 1 tablet in the evening [provider] Taking Active   metFORMIN (GLUCOPHAGE-XR) 750 MG 24 hr tablet [id number] Yes TAKE 1 TABLET (750 MG TOTAL) BY MOUTH 2 (TWO) TIMES DAILY (AM+BEDTIME) [name], MD Taking Active   methocarbamol (ROBAXIN) 750 MG tablet [id number] Yes SMARTSIG:1 Tablet(s) By Mouth Every 12 Hours PRN [provider] Taking Active   montelukast (SINGULAIR) 10 MG tablet [id number] Yes Take 1 tablet by mouth daily. [provider] Taking Active   Multiple Vitamins-Minerals (CENTRUM SILVER 50+WOMEN PO) [id number] Yes Take 1 tablet by mouth daily. [provider] Taking Active Self  nicotine (NICODERM CQ - DOSED IN MG/24 HOURS) 14 mg/24hr patch [id number] No 1 patch to skin  Patient not taking: Reported on 10/21/2021   [provider] Not Taking Active   nicotine (NICODERM CQ - DOSED IN MG/24 HOURS) 14 mg/24hr patch [id number] Yes PLACE 1 PATCH ONTO THE SKIN DAILY [name], MD Taking Active   pantoprazole (PROTONIX) 40 MG tablet  [id number] Yes Take 40 mg by mouth 2 (two) times daily. [provider] Taking Active   polyethylene glycol (MIRALAX / GLYCOLAX) 17 g packet [id number] Yes Take 17 g by mouth daily as needed for moderate constipation. [provider] Taking Active Self  potassium chloride SA (KLOR-CON M) 20 MEQ tablet [id number] Yes TAKE ONE TABLET BY MOUTH ONCE DAILY (NOON) [name], MD Taking Active   Respiratory Therapy Supplies (FLUTTER) DEVI [id number] Yes Use after breathing treatment 4 times daily Elsie Stain, MD Taking Active Self  sucralfate (CARAFATE) 1 g tablet [id number] Yes Take 1 g by mouth 4 (four) times daily -  with meals and at bedtime. [provider] Taking Active Self  SYMBICORT 160-4.5 MCG/ACT inhaler [id number] Yes Inhale 2 puffs into the lungs 2 (two) times daily. [provider] Taking Active   tiotropium (SPIRIVA HANDIHALER) 18 MCG inhalation capsule [id number] Yes PLACE 1 CAPSULE (18 MCG TOTAL) INTO INHALER AND INHALE DAILY. [name], MD Taking Active   tiZANidine (ZANAFLEX)  2 MG tablet [id number] Yes Take 2 mg by mouth 3 (three) times daily. [provider] Taking Active   traMADol (ULTRAM) 50 MG tablet [id number] No 1 tablet as needed  Patient not taking: Reported on 10/30/2021   [provider] Not Taking Active   triamcinolone cream (KENALOG) 0.1 % [id number] Yes Apply 1 application topically 2 (two) times daily. Mayers, Loraine Grip, PA-C Taking Active Self  valsartan-hydrochlorothiazide (DIOVAN-HCT) 160-12.5 MG tablet [id number] Yes TAKE ONE TABLET BY MOUTH ONCE DAILY (AM) [name], MD Taking Active             Patient Active Problem List   Diagnosis Date Noted   IBS (irritable bowel syndrome) 11/19/2021   Spondylolisthesis, lumbar region    Other spondylosis with radiculopathy, lumbar region    Other secondary scoliosis, lumbar region    Fusion of spine of lumbar region 03/25/2021   Colon polyps  06/06/2020   Lupus ([place]) 06/06/2020   Allergic rhinitis due to animal (cat) (dog) hair and dander 04/08/2020   Allergic rhinitis due to pollen 04/08/2020   Food allergy 04/08/2020   Acute medial meniscus tear, left, subsequent encounter 03/21/2020   Chronic pain of left knee 02/15/2020   Paresthesia of skin 11/16/2019   History of total knee replacement, right 11/16/2019   Tobacco abuse 11/16/2019   Centrilobular emphysema ([place]) 08/10/2019   Incidental lung nodule, > 74m and < 862m07/09/2019   OSA on CPAP 08/10/2019   Dyspnea on exertion 08/02/2019   Hyperlipidemia 08/02/2019   Lumbar radiculopathy 04/24/2019   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   Post laminectomy syndrome 02/23/2019   Abnormality of gait 02/23/2019   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   Unilateral primary osteoarthritis, left hip 12/28/2018   Lesion of skin of left ear 12/26/2018   Primary osteoarthritis of left hip 12/02/2018   Iron deficiency anemia 10/16/2018   Chronic pain syndrome 09/08/2018   Chronic pain of right knee 08/11/2018   Status post lumbar laminectomy 07/15/2018   Peripheral arterial disease ([facility]03/04/2018   Moderate persistent asthma without complication 0541/93/7902 Environmental and seasonal allergies 06/29/2017   Controlled type 2 diabetes mellitus with diabetic polyneuropathy, without long-term current use of insulin ([facility]05/28/2019   Perennial allergic rhinitis 04/08/2017   Sensorineural hearing loss (SNHL), bilateral 04/08/2017   Chronic pansinusitis 03/25/2017   Eustachian tube dysfunction, bilateral 0240/97/3532 Lichen planopilaris 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/24/2683 Herniation of lumbar intervertebral disc with radiculopathy 10/02/2016    Class: Chronic   Alopecia areata 08/19/2016   Chondromalacia of both patellae 06/03/2015    Class: Chronic   Spinal stenosis, lumbar region, with neurogenic claudication 06/03/2015   Tobacco use disorder 04/25/2015   DJD (degenerative joint disease) of knee  01/04/2015   Hemorrhoid 11/14/2014   Gout of big toe 07/19/2014   Essential hypertension 08/14/2013   Gastroesophageal reflux disease without esophagitis 08/14/2013   COPD (chronic obstructive pulmonary disease) ([facility]03/15/2013    Conditions to be addressed/monitored per PCP order:  DMII  Care Plan : RN Care Manager Plan of Care  Updates made by RoMelissa MontaneRN since 11/24/2021 12:00 AM     Problem: Health Management needs related to DMII      Long-Range Goal: Development of Plan of Care to address Health Management needs related to DMII   Start Date: 10/21/2021  Expected End Date: 01/19/2022  Priority: High  Note:   Current Barriers:  Chronic Disease Management support and education needs  related to DMII-Patient expresses concern over affording medication copays. She has been using her husbands test strips and lancets to check BS.  RNCM Clinical Goal(s):  Patient will verbalize understanding of plan for management of DMII as evidenced by patient reports take all medications exactly as prescribed and will call provider for medication related questions as evidenced by patient reports and EMR documentation    attend all scheduled medical appointments: 01/05/22 with Dr. [name], 01/12/22 with Neurology and 03/20/21 with [facility] as evidenced by provider documentation        work with pharmacist to address Medication management related to DMII as evidenced by review of EMR and patient or pharmacist report    work with Education officer, museum to address Limited access to food related to the management of DMII as evidenced by review of EMR and patient or Education officer, museum report     through collaboration with Consulting civil engineer, provider, and care team.   Interventions: Inter-disciplinary care team collaboration (see longitudinal plan of care) Evaluation of current treatment plan related to  self management and patient's adherence to plan as established by provider Rescheduled with BSW for local food  pantries Advised patient to contact [facility] and/or Social Services to inquire about medication copay waiver   Diabetes:  (Status: Goal on Track (progressing): YES.) Long Term Goal   Lab Results  Component Value Date   HGBA1C 6.3 09/02/2021   @ Assessed patient's understanding of A1c goal: <7% Reviewed medications with patient and discussed importance of medication adherence;        Reviewed prescribed diet with patient diabetic diet, advised patient to include protein in meals and snacks, examples given; Provided patient with written educational materials related to hypo and hyperglycemia and importance of correct treatment;       call provider for findings outside established parameters;       Review of patient status, including review of consultants reports, relevant laboratory and other test results, and medications completed;       Provided patient with Urgent Tooth [phone] denture evaluation Collaborated with PCP for needed lancets, test strips and albuterol nebulizer  Patient Goals/Self-Care Activities: Take medications as prescribed   Attend all scheduled provider appointments Call provider office for new concerns or questions  check blood sugar at prescribed times: twice daily take the blood sugar meter to all doctor visits keep a food diary       Follow Up:  Patient agrees to Care Plan and Follow-up.  Plan: The Managed Medicaid care management team will reach out to the patient again over the next 30 days.  Date/time of next scheduled RN care management/care coordination outreach:  12/29/21 @ 11:15am  [name] RN, BSN [place]  [employer] manager

## 2021-11-24 NOTE — Patient Instructions (Signed)
Visit Information  [name] was given information about Medicaid Managed Care team care coordination services as a part of their [place] Medicaid benefit. [name] verbally consented to engagement with the [facility] Managed Care team.   If you are experiencing a medical emergency, please call 911 or report to your local emergency department or urgent care.   If you have a non-emergency medical problem during routine business hours, please contact your provider's office and ask to speak with a nurse.   For questions related to your [facility], please call: [phone] or visit the homepage here: [email or web address]  If you would like to schedule transportation through your [facility], please call the following number at least 2 days in advance of your appointment: [phone]   Rides for urgent appointments can also be made after hours by calling Member Services.  Call the [place] at [phone], at any time, 24 hours a day, 7 days a week. If you are in danger or need immediate medical attention call 911.  If you would like help to quit smoking, call 1-800-QUIT-NOW (762) 088-2800) OR Espaol: 1-855-Djelo-Ya (2-774-128-7867) o para ms informacin haga clic aqu or Text READY to 200-400 to register via text  Stacy [name],   Please see education materials related to diabetes provided by MyChart link.  Patient verbalizes understanding of instructions and care plan provided today and agrees to view in [place]. Active MyChart status and patient understanding of how to access instructions and care plan via MyChart confirmed with patient.     Telephone follow up appointment with Managed Medicaid care management team member scheduled for:12/29/21 @ 11:15am  [name] RN, BSN [place] RN Care  Coordinator   Following is a copy of your plan of care:  Care Plan : RN Care Manager Plan of Care  Updates made by [name], RN since 11/24/2021 12:00 AM     Problem: Health Management needs related to DMII      Long-Range Goal: Development of Plan of Care to address Health Management needs related to DMII   Start Date: 10/21/2021  Expected End Date: 01/19/2022  Priority: High  Note:   Current Barriers:  Chronic Disease Management support and education needs related to DMII-Patient expresses concern over affording medication copays. She has been using her husbands test strips and lancets to check BS.  RNCM Clinical Goal(s):  Patient will verbalize understanding of plan for management of DMII as evidenced by patient reports take all medications exactly as prescribed and will call provider for medication related questions as evidenced by patient reports and EMR documentation    attend all scheduled medical appointments: 01/05/22 with Dr. [name], 01/12/22 with Neurology and 03/20/21 with [facility] as evidenced by provider documentation        work with pharmacist to address Medication management related to DMII as evidenced by review of EMR and patient or pharmacist report    work with Education officer, museum to address Limited access to food related to the management of DMII as evidenced by review of EMR and patient or Education officer, museum report     through collaboration with Consulting civil engineer, provider, and care team.   Interventions: Inter-disciplinary care team collaboration (see longitudinal plan of care) Evaluation of current treatment plan related to  self management and patient's adherence to plan as established by provider Rescheduled with BSW for local food pantries Advised patient to contact [facility]  and/or Social Services to inquire about medication copay waiver   Diabetes:  (Status: Goal on Track (progressing): YES.) Long Term Goal   Lab Results  Component Value Date   HGBA1C 6.3 09/02/2021    @ Assessed patient's understanding of A1c goal: <7% Reviewed medications with patient and discussed importance of medication adherence;        Reviewed prescribed diet with patient diabetic diet, advised patient to include protein in meals and snacks, examples given; Provided patient with written educational materials related to hypo and hyperglycemia and importance of correct treatment;       call provider for findings outside established parameters;       Review of patient status, including review of consultants reports, relevant laboratory and other test results, and medications completed;       Provided patient with Urgent Tooth [phone] denture evaluation Collaborated with PCP for needed lancets, test strips and albuterol nebulizer  Patient Goals/Self-Care Activities: Take medications as prescribed   Attend all scheduled provider appointments Call provider office for new concerns or questions  check blood sugar at prescribed times: twice daily take the blood sugar meter to all doctor visits keep a food diary

## 2021-11-25 ENCOUNTER — Other Ambulatory Visit: Payer: Self-pay

## 2021-11-25 ENCOUNTER — Other Ambulatory Visit: Payer: Self-pay | Admitting: Surgery

## 2021-11-25 ENCOUNTER — Telehealth: Payer: Self-pay | Admitting: *Deleted

## 2021-11-25 NOTE — Patient Instructions (Signed)
Visit Information  Ms. Kamiya was given information about Medicaid Managed Care team care coordination services as a part of their [place] Medicaid benefit. Stefan Church Faulk verbally consented to engagement with the [facility] Managed Care team.   If you are experiencing a medical emergency, please call 911 or report to your local emergency department or urgent care.   If you have a non-emergency medical problem during routine business hours, please contact your provider's office and ask to speak with a nurse.   For questions related to your [facility], please call: [phone] or visit the homepage here: [email or web address]  If you would like to schedule transportation through your [facility], please call the following number at least 2 days in advance of your appointment: [phone]   Rides for urgent appointments can also be made after hours by calling Member Services.  Call the [place] at [phone], at any time, 24 hours a day, 7 days a week. If you are in danger or need immediate medical attention call 911.  If you would like help to quit smoking, call 1-800-QUIT-NOW 939 800 6822) OR Espaol: 1-855-Djelo-Ya (0-973-532-9924) o para ms informacin haga clic aqu or Text READY to 200-400 to register via text  Ms. Lapaglia - following are the goals we discussed in your visit today:   Goals Addressed   None      Social Worker will follow up in 10 days .   Mickel Fuchs, BSW, [place]  High Risk Managed Medicaid Team  [phone]   Following is a copy of your plan of care:  Care Plan : [place] of Care  Updates made by Ethelda Chick since 11/25/2021 12:00 AM     Problem: Health Management needs related to DMII      Long-Range Goal: Development of Plan of Care  to address Health Management needs related to DMII   Start Date: 10/21/2021  Expected End Date: 01/19/2022  Priority: High  Note:   Current Barriers:  Chronic Disease Management support and education needs related to DMII-Patient expresses concern over affording medication copays. She has been using her husbands test strips and lancets to check BS.  RNCM Clinical Goal(s):  Patient will verbalize understanding of plan for management of DMII as evidenced by patient reports take all medications exactly as prescribed and will call provider for medication related questions as evidenced by patient reports and EMR documentation    attend all scheduled medical appointments: 01/05/22 with Dr. [name], 01/12/22 with Neurology and 03/20/21 with [facility] as evidenced by provider documentation        work with pharmacist to address Medication management related to DMII as evidenced by review of EMR and patient or pharmacist report    work with Education officer, museum to address Limited access to food related to the management of DMII as evidenced by review of EMR and patient or Education officer, museum report     through collaboration with Consulting civil engineer, provider, and care team.   Interventions: Inter-disciplinary care team collaboration (see longitudinal plan of care) Evaluation of current treatment plan related to  self management and patient's adherence to plan as established by provider Rescheduled with BSW for local food pantries Advised patient to contact [facility] and/or Social Services to inquire about medication copay waiver BSW completed a telephone outreach with patient for food pantries. Patient states she does receive 135 foodstamps each month and her  SSI is 450/monthly. Patient stated that she would like resources to be mailed to her. No other resources are needed at this time. SDOH assessment completed.   Diabetes:  (Status: Goal on Track (progressing): YES.) Long Term Goal   Lab Results  Component Value Date   HGBA1C  6.3 09/02/2021   @ Assessed patient's understanding of A1c goal: <7% Reviewed medications with patient and discussed importance of medication adherence;        Reviewed prescribed diet with patient diabetic diet, advised patient to include protein in meals and snacks, examples given; Provided patient with written educational materials related to hypo and hyperglycemia and importance of correct treatment;       call provider for findings outside established parameters;       Review of patient status, including review of consultants reports, relevant laboratory and other test results, and medications completed;       Provided patient with Urgent Tooth [phone] denture evaluation Collaborated with PCP for needed lancets, test strips and albuterol nebulizer  Patient Goals/Self-Care Activities: Take medications as prescribed   Attend all scheduled provider appointments Call provider office for new concerns or questions  check blood sugar at prescribed times: twice daily take the blood sugar meter to all doctor visits keep a food diary

## 2021-11-25 NOTE — Patient Outreach (Signed)
Medicaid Managed Care Social Work Note  11/25/2021 Name:  Stacy [name] [facility] MRN:  [id number] DOB:  Sep 17, 1960  Stacy [name] is an 61 y.o. year old female who is a primary patient of [name], MD.  The Medicaid Managed Care Coordination team was consulted for assistance with:  Food Insecurity  [name] was given information about Medicaid Managed Care Coordination team services today. Stacy Church Meschke Patient agreed to services and verbal consent obtained.  Engaged with patient  for by telephone forinitial visit in response to referral for case management and/or care coordination services.   Assessments/Interventions:  Review of past medical history, allergies, medications, health status, including review of consultants reports, laboratory and other test data, was performed as part of comprehensive evaluation and provision of chronic care management services.  SDOH: (Social Determinant of Health) assessments and interventions performed: SDOH Interventions    Flowsheet Row Patient Outreach Telephone from 10/21/2021 in [place] Coordination  SDOH Interventions   Housing Interventions Intervention Not Indicated  Transportation Interventions Intervention Not Indicated     BSW completed a telephone outreach with patient for food pantries. Patient states she does receive 135 foodstamps each month and her SSI is 450/monthly. Patient stated that she would like resources to be mailed to her. No other resources are needed at this time. SDOH assessment completed.  Advanced Directives Status:  Not addressed in this encounter.  Care Plan                 Allergies  Allergen Reactions   Other Shortness Of Breath    UNSPECIFIED AGENTS Allergic to perfumes and cleaning products   Shellfish Allergy Anaphylaxis    Per allergy test.   Ace Inhibitors Cough and Other (See Comments)        Celecoxib      upset stomach  Pt is taking med     Aspirin Nausea Only    stomach upset     Medications Reviewed Today     Reviewed by [name], RN (Registered Nurse) on 11/24/21 at 1054  Med List Status: <None>   Medication Order Taking? Sig Documenting Provider Last Dose Status Informant  Accu-Chek Softclix Lancets lancets [id number] No USE AS INSTRUCTED 3 TIMES A DAY  Patient not taking: Reported on 11/24/2021   [name], MD Not Taking Active Self  albuterol (PROVENTIL) (2.5 MG/3ML) 0.083% nebulizer solution [id number] No USE ONE VIAL (2.5 MG TOTAL) BY NEBULIZATION EVERY 6 (SIX) HOURS AS NEEDED FOR WHEEZING.  Patient not taking: Reported on 11/24/2021   [name], MD Not Taking Active            Med Note [name], [name] Oct 30, 2021  3:51 PM) Using 1-2 times weekly  allopurinol (ZYLOPRIM) 100 MG tablet [id number] Yes TAKE 1 TABLET (100 MG TOTAL) BY MOUTH DAILY.(AM) [name], PA-C Taking Active   atorvastatin (LIPITOR) 40 MG tablet [id number] Yes TAKE 1 TABLET (40 MG TOTAL) BY MOUTH DAILY. (BEDTIME) [name], MD Taking Active   Blood Glucose Monitoring Suppl (ACCU-CHEK AVIVA PLUS) w/Device KIT [id number]  1 each by Does not apply route 3 (three) times daily. [name], [place]  Active Self  clopidogrel (PLAVIX) 75 MG tablet [id number] Yes TAKE 1 TABLET (75 MG TOTAL) BY MOUTH DAILY. (AM) [name], MD Taking Active   diclofenac Sodium (VOLTAREN) 1 % GEL [id number] Yes Apply 2 g topically 4 (four) times daily. [provider] Taking Active   DULoxetine (CYMBALTA) 60 MG capsule [id number] Yes TAKE 1 CAPSULE (60 MG TOTAL) BY MOUTH DAILY (AM) [name], MD Taking Active   EPINEPHrine 0.3 mg/0.3 mL IJ SOAJ injection [id number]  0.3 mg IM x 1 PRN for allergic reaction  Patient not taking: Reported on 10/30/2021   [name], MD  Active Self  [name] 325 (65 Fe) MG tablet [id number] Yes Take 325 mg by mouth daily. [provider] Taking Active    fluticasone (FLONASE) 50 MCG/ACT nasal spray [id number] Yes Place 1 spray into both nostrils daily. [name], MD Taking Active Self    Discontinued 04/16/11 0943 Glucagon 1 MG/0.2ML SOSY [id number]  1 mg arrange subcutaneously as needed for severe symptomatic low blood sugar  Patient not taking: Reported on 10/30/2021   [name], MD  Active   glucose blood (ACCU-CHEK AVIVA PLUS) test strip [id number] No Use as instructed  Patient not taking: Reported on 11/24/2021   [name], MD Not Taking Active   glucose chewable tablet 12 g [id number]   [name], MD  Active   glycopyrrolate (ROBINUL) 2 MG tablet [id number] Yes Take 2 mg by mouth 3 (three) times daily. [provider] Taking Active   hydrOXYzine (ATARAX) 10 MG tablet [id number] Yes TAKE 1 TABLET IN THE MORNING AND NOON AND 2 TABLETS IN THE EVENING AS NEEDED. [name], MD Taking Active   Lancets [facility] SOFT [facility]) lancets [id number] No Use as instructed  Patient not taking: Reported on 11/24/2021   [name] Not Taking Active Self    Discontinued 04/16/11 0916 (Error) loratadine (CLARITIN) 10 MG tablet [id number] Yes TAKE 1 TABLET (10 MG TOTAL) BY MOUTH DAILY. (AM) [name], MD Taking Active   melatonin 5 MG TABS [id number] Yes 1 tablet in the evening [provider] Taking Active   metFORMIN (GLUCOPHAGE-XR) 750 MG 24 hr tablet [id number] Yes TAKE 1 TABLET (750 MG TOTAL) BY MOUTH 2 (TWO) TIMES DAILY (AM+BEDTIME) [name], MD Taking Active   methocarbamol (ROBAXIN) 750 MG tablet [id number] Yes SMARTSIG:1 Tablet(s) By Mouth Every 12 Hours PRN [provider] Taking Active   montelukast (SINGULAIR) 10 MG tablet [id number] Yes Take 1 tablet by mouth daily. [provider] Taking Active   Multiple Vitamins-Minerals (CENTRUM SILVER 50+WOMEN PO) [id number] Yes Take 1 tablet by mouth daily. [provider] Taking Active Self  nicotine  (NICODERM CQ - DOSED IN MG/24 HOURS) 14 mg/24hr patch [id number] No 1 patch to skin  Patient not taking: Reported on 10/21/2021   [provider] Not Taking Active   nicotine (NICODERM CQ - DOSED IN MG/24 HOURS) 14 mg/24hr patch [id number] Yes PLACE 1 PATCH ONTO THE SKIN DAILY [name], MD Taking Active   pantoprazole (PROTONIX) 40 MG tablet [id number] Yes Take 40 mg by mouth 2 (two) times daily. [provider] Taking Active   polyethylene glycol (MIRALAX / GLYCOLAX) 17 g packet [id number] Yes Take 17 g by mouth daily as needed for moderate constipation. [provider] Taking Active Self  potassium chloride SA (KLOR-CON M) 20 MEQ tablet [id number] Yes TAKE ONE TABLET BY MOUTH ONCE DAILY (NOON) [name], MD Taking Active   Respiratory Therapy Supplies (FLUTTER) DEVI [id number] Yes Use after breathing treatment 4 times daily Elsie Stain, MD Taking Active Self  sucralfate (CARAFATE) 1 g tablet [id number] Yes Take 1 g by mouth 4 (four) times daily -  with meals and at bedtime. [provider] Taking Active Self  SYMBICORT 160-4.5 MCG/ACT inhaler [id number] Yes Inhale 2 puffs into the lungs 2 (two) times daily. [provider] Taking Active   tiotropium (SPIRIVA HANDIHALER) 18 MCG inhalation capsule [id number] Yes PLACE 1 CAPSULE (18 MCG TOTAL) INTO INHALER AND INHALE DAILY. [name], MD Taking Active   tiZANidine (ZANAFLEX) 2 MG tablet [id number] Yes Take 2 mg by mouth 3 (three) times daily. [provider] Taking Active   traMADol (ULTRAM) 50 MG tablet [id number] No 1 tablet as needed  Patient not taking: Reported on 10/30/2021   [provider] Not Taking Active   triamcinolone cream (KENALOG) 0.1 % [id number] Yes Apply 1 application topically 2 (two) times daily. Mayers, Loraine Grip, PA-C Taking Active Self  valsartan-hydrochlorothiazide (DIOVAN-HCT) 160-12.5 MG tablet [id number] Yes TAKE ONE TABLET BY MOUTH ONCE DAILY  (AM) [name], MD Taking Active             Patient Active Problem List   Diagnosis Date Noted   IBS (irritable bowel syndrome) 11/19/2021   Spondylolisthesis, lumbar region    Other spondylosis with radiculopathy, lumbar region    Other secondary scoliosis, lumbar region    Fusion of spine of lumbar region 03/25/2021   Colon polyps 06/06/2020   Lupus ([place]) 06/06/2020   Allergic rhinitis due to animal (cat) (dog) hair and dander 04/08/2020   Allergic rhinitis due to pollen 04/08/2020   Food allergy 04/08/2020   Acute medial meniscus tear, left, subsequent encounter 03/21/2020   Chronic pain of left knee 02/15/2020   Paresthesia of skin 11/16/2019   History of total knee replacement, right 11/16/2019   Tobacco abuse 11/16/2019   Centrilobular emphysema ([place]) 08/10/2019   Incidental lung nodule, > 42m and < 860m07/09/2019   OSA on CPAP 08/10/2019   Dyspnea on exertion 08/02/2019   Hyperlipidemia 08/02/2019   Lumbar radiculopathy 04/24/2019   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   Post laminectomy syndrome 02/23/2019   Abnormality of gait 02/23/2019   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   Herniation of lumbar intervertebral disc with radiculopathy 10/02/2016    Class: Chronic   Alopecia areata 08/19/2016   Chondromalacia of both patellae 06/03/2015    Class: Chronic   Spinal stenosis, lumbar region, with neurogenic claudication 06/03/2015   Tobacco use disorder 04/25/2015   DJD (degenerative joint disease) of knee 01/04/2015   Hemorrhoid 11/14/2014   Gout of big toe 07/19/2014   Essential hypertension 08/14/2013   Gastroesophageal reflux disease without esophagitis 08/14/2013   COPD (chronic obstructive pulmonary disease) ([facility]03/15/2013    Conditions to be addressed/monitored per PCP order:   food pantries  Care Plan : RN Care Manager Plan of Care  Updates made  by Ethelda Chick since 11/25/2021 12:00 AM     Problem: Health Management needs related to DMII      Long-Range Goal: Development of Plan of Care to address Health Management needs related to DMII   Start Date: 10/21/2021  Expected End Date: 01/19/2022  Priority: High  Note:   Current Barriers:  Chronic Disease Management support and education needs related to DMII-Patient expresses concern over affording medication copays. She has been using her husbands test strips and lancets to check BS.  RNCM Clinical Goal(s):  Patient will verbalize understanding of plan for management of DMII as evidenced by patient reports take all medications exactly as prescribed and will call provider for medication related questions as evidenced by patient reports and EMR documentation    attend all scheduled medical appointments: 01/05/22 with Dr. [name], 01/12/22 with Neurology and 03/20/21 with [facility] as evidenced by provider documentation        work with pharmacist to address Medication management related to DMII as evidenced by review of EMR and patient or pharmacist report    work with Education officer, museum to address Limited access to food related to the management of DMII as evidenced by review of  EMR and patient or Education officer, museum report     through collaboration with Consulting civil engineer, provider, and care team.   Interventions: Inter-disciplinary care team collaboration (see longitudinal plan of care) Evaluation of current treatment plan related to  self management and patient's adherence to plan as established by provider Rescheduled with BSW for local food pantries Advised patient to contact [facility] and/or Social Services to inquire about medication copay waiver BSW completed a telephone outreach with patient for food pantries. Patient states she does receive 135 foodstamps each month and her SSI is 450/monthly. Patient stated that she would like resources to be mailed to her. No other resources are needed at this time. SDOH assessment completed.   Diabetes:  (Status: Goal on Track (progressing): YES.) Long Term Goal   Lab Results  Component Value Date   HGBA1C 6.3 09/02/2021   @ Assessed patient's understanding of A1c goal: <7% Reviewed medications with patient and discussed importance of medication adherence;        Reviewed prescribed diet with patient diabetic diet, advised patient to include protein in meals and snacks, examples given; Provided patient with written educational materials related to hypo and hyperglycemia and importance of correct treatment;       call provider for findings outside established parameters;       Review of patient status, including review of consultants reports, relevant laboratory and other test results, and medications completed;       Provided patient with Urgent Tooth [phone] denture evaluation Collaborated with PCP for needed lancets, test strips and albuterol nebulizer  Patient Goals/Self-Care Activities: Take medications as prescribed   Attend all scheduled provider appointments Call provider office for new concerns or questions  check blood sugar at prescribed times: twice daily take the blood sugar meter to all doctor visits keep a  food diary       Follow up:  Patient agrees to Care Plan and Follow-up.  Plan: The Managed Medicaid care management team will reach out to the patient again over the next 10 days.  Date/time of next scheduled Social Work care management/care coordination outreach:  12/09/21  Mickel Fuchs, Arita Miss, [place] Medicaid Team  [phone]

## 2021-11-25 NOTE — Telephone Encounter (Signed)
Pt called requesting a refill of methocarbamol and pain cream. Please send to [place]. Pt phone number is [phone].

## 2021-11-25 NOTE — Patient Outreach (Signed)
Care Coordination  11/25/2021  Joe Tanney [facility] [birth year] [phone]   RNCM attempting to reach Ms. Calvert regarding requested test strips. RNCM received message from Dr. [name], notifying RNCM that she has place the order for requested test strips, lancets and albuterol nebulizer. Dr. [name] requested RNCM to contact patient and verify correct test strips. RNCM left a detailed message for patient to contact RNCM or Dr. [name] if ordered test strips are not compatible with her glucometer.  [name] RN, BSN [place]  [employer] manager

## 2021-11-26 DIAGNOSIS — F331 Major depressive disorder, recurrent, moderate: Secondary | ICD-10-CM | POA: Diagnosis not present

## 2021-12-02 MED ORDER — METHOCARBAMOL 750 MG PO TABS: ORAL_TABLET | ORAL | 1 refills | Status: DC

## 2021-12-02 MED ORDER — DICLOFENAC SODIUM 1 % EX GEL: 2.0000 g | Freq: Four times a day (QID) | CUTANEOUS | 5 refills | Status: DC

## 2021-12-03 DIAGNOSIS — F331 Major depressive disorder, recurrent, moderate: Secondary | ICD-10-CM | POA: Diagnosis not present

## 2021-12-04 ENCOUNTER — Ambulatory Visit: Payer: Medicaid Other | Attending: Internal Medicine | Admitting: Internal Medicine

## 2021-12-04 DIAGNOSIS — N3946 Mixed incontinence: Secondary | ICD-10-CM

## 2021-12-04 DIAGNOSIS — Z029 Encounter for administrative examinations, unspecified: Secondary | ICD-10-CM

## 2021-12-04 DIAGNOSIS — R159 Full incontinence of feces: Secondary | ICD-10-CM | POA: Diagnosis not present

## 2021-12-04 NOTE — Progress Notes (Signed)
Patient ID: Stacy [name], female   DOB: [birth year], 61 y.o.   MRN: [id number] Virtual Visit via Telephone Note  I connected with Stefan Church Fye on 12/04/2021 at 8:24 a.m by telephone and verified that I am speaking with the correct person using two identifiers  Location: Patient: home Provider: office  Participants: Myself Patient   I discussed the limitations, risks, security and privacy concerns of performing an evaluation and management service by telephone and the availability of in person appointments. I also discussed with the patient that there may be a patient responsible charge related to this service. The patient expressed understanding and agreed to proceed.   History of Present Illness: t with hx of HTN, DM with neuropathy, aortic atherosclerosis, tob dep, HL, PAD, IDA, lupus, spinal stenosis with neurogenic claudication (s/p laminectomy 07/2018), COPD,OSA on CPAP,LS, OA knees, gout, recurrent sinusitis, receiving allergy shots from Dr. Faith Rogue.  This visit was called due to patient request for incontinence supplies through the company [employer].  Patient reports urinary incontinence for over 2 months.  When she gets the urge to urinate she is not able to hold it until she gets to the restroom.  She also has incontinence with laughing and coughing.  She denies any dysuria.  She also endorses intermittent fecal incontinence due to irritable bowel syndrome.  Reports that when she has diarrhea from IBS, sometimes she does have a little bit of incontinence before she can get to the restroom.  She is on a medication called glycopyrrolate 2 mg 3 times a day with meals through her gastroenterologist.  She has been purchasing incontinence supplies over-the-counter.  She is requesting depends undergarments size large.   Outpatient Encounter Medications as of 12/04/2021  Medication Sig   albuterol (PROVENTIL) (2.5 MG/3ML) 0.083% nebulizer solution USE ONE VIAL (2.5 MG  TOTAL) BY NEBULIZATION EVERY 6 (SIX) HOURS AS NEEDED FOR WHEEZING.   allopurinol (ZYLOPRIM) 100 MG tablet TAKE 1 TABLET (100 MG TOTAL) BY MOUTH DAILY.(AM)   atorvastatin (LIPITOR) 40 MG tablet TAKE 1 TABLET (40 MG TOTAL) BY MOUTH DAILY. (BEDTIME)   Blood Glucose Monitoring Suppl (ACCU-CHEK AVIVA PLUS) w/Device KIT 1 each by Does not apply route 3 (three) times daily.   clopidogrel (PLAVIX) 75 MG tablet TAKE 1 TABLET (75 MG TOTAL) BY MOUTH DAILY. (AM)   diclofenac Sodium (VOLTAREN) 1 % GEL Apply 2 g topically 4 (four) times daily.   DULoxetine (CYMBALTA) 60 MG capsule TAKE 1 CAPSULE (60 MG TOTAL) BY MOUTH DAILY (AM)   EPINEPHrine 0.3 mg/0.3 mL IJ SOAJ injection 0.3 mg IM x 1 PRN for allergic reaction (Patient not taking: Reported on 10/30/2021)   [name] 325 (65 Fe) MG tablet Take 325 mg by mouth daily.   fluticasone (FLONASE) 50 MCG/ACT nasal spray Place 1 spray into both nostrils daily.   Glucagon 1 MG/0.2ML SOSY 1 mg arrange subcutaneously as needed for severe symptomatic low blood sugar (Patient not taking: Reported on 10/30/2021)   glucose blood (ACCU-CHEK AVIVA PLUS) test strip Use as instructed   glycopyrrolate (ROBINUL) 2 MG tablet Take 2 mg by mouth 3 (three) times daily.   hydrOXYzine (ATARAX) 10 MG tablet TAKE 1 TABLET IN THE MORNING AND NOON AND 2 TABLETS IN THE EVENING AS NEEDED.   Lancets (ACCU-CHEK MULTICLIX) lancets Use as instructed   Lancets (ACCU-CHEK SOFT TOUCH) lancets Use as instructed (Patient not taking: Reported on 11/24/2021)   loratadine (CLARITIN) 10 MG tablet TAKE 1 TABLET (10 MG TOTAL) BY MOUTH DAILY. (AM)  melatonin 5 MG TABS 1 tablet in the evening   metFORMIN (GLUCOPHAGE-XR) 750 MG 24 hr tablet TAKE 1 TABLET (750 MG TOTAL) BY MOUTH 2 (TWO) TIMES DAILY (AM+BEDTIME)   methocarbamol (ROBAXIN) 750 MG tablet SMARTSIG:1 Tablet(s) By Mouth Every 12 Hours PRN   montelukast (SINGULAIR) 10 MG tablet Take 1 tablet by mouth daily.   Multiple Vitamins-Minerals (CENTRUM SILVER  50+WOMEN PO) Take 1 tablet by mouth daily.   nicotine (NICODERM CQ - DOSED IN MG/24 HOURS) 14 mg/24hr patch 1 patch to skin (Patient not taking: Reported on 10/21/2021)   nicotine (NICODERM CQ - DOSED IN MG/24 HOURS) 14 mg/24hr patch PLACE 1 PATCH ONTO THE SKIN DAILY   pantoprazole (PROTONIX) 40 MG tablet Take 40 mg by mouth 2 (two) times daily.   polyethylene glycol (MIRALAX / GLYCOLAX) 17 g packet Take 17 g by mouth daily as needed for moderate constipation.   potassium chloride SA (KLOR-CON M) 20 MEQ tablet TAKE ONE TABLET BY MOUTH ONCE DAILY (NOON)   Respiratory Therapy Supplies (FLUTTER) DEVI Use after breathing treatment 4 times daily   sucralfate (CARAFATE) 1 g tablet Take 1 g by mouth 4 (four) times daily -  with meals and at bedtime.   SYMBICORT 160-4.5 MCG/ACT inhaler Inhale 2 puffs into the lungs 2 (two) times daily.   tiotropium (SPIRIVA HANDIHALER) 18 MCG inhalation capsule PLACE 1 CAPSULE (18 MCG TOTAL) INTO INHALER AND INHALE DAILY.   tiZANidine (ZANAFLEX) 2 MG tablet Take 2 mg by mouth 3 (three) times daily.   traMADol (ULTRAM) 50 MG tablet 1 tablet as needed (Patient not taking: Reported on 10/30/2021)   triamcinolone cream (KENALOG) 0.1 % Apply 1 application topically 2 (two) times daily.   valsartan-hydrochlorothiazide (DIOVAN-HCT) 160-12.5 MG tablet TAKE ONE TABLET BY MOUTH ONCE DAILY (AM)   [DISCONTINUED] Fluticasone-Salmeterol (ADVAIR) 500-50 MCG/DOSE AEPB Inhale 1 puff into the lungs every 12 (twelve) hours.     [DISCONTINUED] lisinopril (PRINIVIL,ZESTRIL) 40 MG tablet Take 40 mg by mouth daily.     Facility-Administered Encounter Medications as of 12/04/2021  Medication   glucose chewable tablet 12 g      Observations/Objective: No direct observation done as this was a telephone visit.  Assessment and Plan: 1. Administrative encounter 2. Mixed stress and urge urinary incontinence 3. Functional fecal incontinence -Prescription will be sent to the [place] to  request depends undergarments for her to use.   Follow Up Instructions: As previously scheduled.   I discussed the assessment and treatment plan with the patient. The patient was provided an opportunity to ask questions and all were answered. The patient agreed with the plan and demonstrated an understanding of the instructions.   The patient was advised to call back or seek an in-person evaluation if the symptoms worsen or if the condition fails to improve as anticipated.  I  Spent 5 minutes on this telephone encounter  This note has been created with Surveyor, quantity. Any transcriptional errors are unintentional.  Karle Plumber, MD

## 2021-12-08 ENCOUNTER — Other Ambulatory Visit: Payer: Self-pay | Admitting: Internal Medicine

## 2021-12-09 ENCOUNTER — Other Ambulatory Visit: Payer: Self-pay

## 2021-12-09 NOTE — Patient Instructions (Signed)
Visit Information  Ms. Yahnke was given information about Medicaid Managed Care team care coordination services as a part of their [facility] Community Plan Medicaid benefit. [name] Ringenberg verbally consented to engagement with the [facility] Managed Care team.   If you are experiencing a medical emergency, please call 911 or report to your local emergency department or urgent care.   If you have a non-emergency medical problem during routine business hours, please contact your provider's office and ask to speak with a nurse.   For questions related to your [facility], please call: [phone] or visit the homepage here: [email or web address]  If you would like to schedule transportation through your [facility], please call the following number at least 2 days in advance of your appointment: [phone]   Rides for urgent appointments can also be made after hours by calling Member Services.  Call the Behavioral Health Crisis Line at [phone], at any time, 24 hours a day, 7 days a week. If you are in danger or need immediate medical attention call 911.  If you would like help to quit smoking, call 1-800-QUIT-NOW ((970)310-9062) OR Espaol: 1-855-Djelo-Ya (7-253-664-4034) o para ms informacin haga clic aqu or Text READY to 742-595 to register via text  Ms. Donahey - following are the goals we discussed in your visit today:   Goals Addressed   None     Social Worker will follow up in 2 days.   [name], BSW, [facility]  [place]  High Risk Managed Medicaid Team  [phone]   Following is a copy of your plan of care:  Care Plan : RN Care Manager Plan of Care  Updates made by Shaune Leeks since 12/09/2021 12:00 AM     Problem: Health Management needs related to DMII      Long-Range Goal: Development of Plan of Care to  address Health Management needs related to DMII   Start Date: 10/21/2021  Expected End Date: 01/19/2022  Priority: High  Note:   Current Barriers:  Chronic Disease Management support and education needs related to DMII-Patient expresses concern over affording medication copays. She has been using her husbands test strips and lancets to check BS.  RNCM Clinical Goal(s):  Patient will verbalize understanding of plan for management of DMII as evidenced by patient reports take all medications exactly as prescribed and will call provider for medication related questions as evidenced by patient reports and EMR documentation    attend all scheduled medical appointments: 01/05/22 with Dr. Laural Benes, 01/12/22 with Neurology and 03/20/21 with [facility] as evidenced by provider documentation        work with pharmacist to address Medication management related to DMII as evidenced by review of EMR and patient or pharmacist report    work with Child psychotherapist to address Limited access to food related to the management of DMII as evidenced by review of EMR and patient or Child psychotherapist report     through collaboration with Medical illustrator, provider, and care team.   Interventions: Inter-disciplinary care team collaboration (see longitudinal plan of care) Evaluation of current treatment plan related to  self management and patient's adherence to plan as established by provider Rescheduled with BSW for local food pantries Advised patient to contact [facility] and/or Social Services to inquire about medication copay waiver BSW completed a telephone outreach with patient for food resources, patient stated she did receive the list. No other resources are needed at  this time. BSW asked patient if she received her depends from [place], she stated she did not. She contacted [employer] and they stated they have no received anything. BSW encouraged patient to give it until 11/9, if she has not received anything by then BSW will follow up with  PCP.   Diabetes:  (Status: Goal on Track (progressing): YES.) Long Term Goal   Lab Results  Component Value Date   HGBA1C 6.3 09/02/2021   @ Assessed patient's understanding of A1c goal: <7% Reviewed medications with patient and discussed importance of medication adherence;        Reviewed prescribed diet with patient diabetic diet, advised patient to include protein in meals and snacks, examples given; Provided patient with written educational materials related to hypo and hyperglycemia and importance of correct treatment;       call provider for findings outside established parameters;       Review of patient status, including review of consultants reports, relevant laboratory and other test results, and medications completed;       Provided patient with Urgent Tooth [phone] denture evaluation Collaborated with PCP for needed lancets, test strips and albuterol nebulizer  Patient Goals/Self-Care Activities: Take medications as prescribed   Attend all scheduled provider appointments Call provider office for new concerns or questions  check blood sugar at prescribed times: twice daily take the blood sugar meter to all doctor visits keep a food diary

## 2021-12-10 DIAGNOSIS — I10 Essential (primary) hypertension: Secondary | ICD-10-CM | POA: Diagnosis not present

## 2021-12-10 DIAGNOSIS — F331 Major depressive disorder, recurrent, moderate: Secondary | ICD-10-CM | POA: Diagnosis not present

## 2021-12-10 DIAGNOSIS — R32 Unspecified urinary incontinence: Secondary | ICD-10-CM | POA: Diagnosis not present

## 2021-12-11 ENCOUNTER — Other Ambulatory Visit: Payer: Medicaid Other

## 2021-12-11 NOTE — Patient Instructions (Signed)
Visit Information  Stacy [name] was given information about Medicaid Managed Care team care coordination services as a part of their [facility] Community Plan Medicaid benefit. Stacy [name] verbally consented to engagement with the [facility] Managed Care team.   If you are experiencing a medical emergency, please call 911 or report to your local emergency department or urgent care.   If you have a non-emergency medical problem during routine business hours, please contact your provider's office and ask to speak with a nurse.   For questions related to your [facility], please call: [phone] or visit the homepage here: [email or web address]  If you would like to schedule transportation through your [facility], please call the following number at least 2 days in advance of your appointment: [phone]   Rides for urgent appointments can also be made after hours by calling Member Services.  Call the Behavioral Health Crisis Line at [phone], at any time, 24 hours a day, 7 days a week. If you are in danger or need immediate medical attention call 911.  If you would like help to quit smoking, call 1-800-QUIT-NOW (838-836-9685) OR Espaol: 1-855-Djelo-Ya (1-696-789-3810) o para ms informacin haga clic aqu or Text READY to 175-102 to register via text  [name] - following are the goals we discussed in your visit today:   Goals Addressed   None      Social Worker will follow up on 01/12/22.   [name], BSW, [facility]  [place]  High Risk Managed Medicaid Team  [phone]   Following is a copy of your plan of care:  There are no care plans that you recently modified to display for this patient.

## 2021-12-11 NOTE — Patient Outreach (Signed)
Medicaid Managed Care Social Work Note  12/11/2021 Name:  Stacy [name] [place] MRN:  [id number] DOB:  July 11, 1960  Stacy [name] is an 61 y.o. year old female who is a primary patient of [name], MD.  The Medicaid Managed Care Coordination team was consulted for assistance with:  Community Resources   Ms. Broner was given information about Medicaid Managed Care Coordination team services today. Stacy Church Catala Patient agreed to services and verbal consent obtained.  Engaged with patient  for by telephone forfollow up visit in response to referral for case management and/or care coordination services.   Assessments/Interventions:  Review of past medical history, allergies, medications, health status, including review of consultants reports, laboratory and other test data, was performed as part of comprehensive evaluation and provision of chronic care management services.  SDOH: (Social Determinant of Health) assessments and interventions performed: SDOH Interventions    Flowsheet Row Patient Outreach Telephone from 10/21/2021 in [place] Coordination  SDOH Interventions   Housing Interventions Intervention Not Indicated  Transportation Interventions Intervention Not Indicated     BSW completed a telephone outreach with patient to check on depends. Patient stated she did hear from [place] on 11/8 and they informed her they did receive the order and would be shipping through [employer]. No other resources are needed at this time.  Advanced Directives Status:  Not addressed in this encounter.  Care Plan                 Allergies  Allergen Reactions   Other Shortness Of Breath    UNSPECIFIED AGENTS Allergic to perfumes and cleaning products   Shellfish Allergy Anaphylaxis    Per allergy test.   Ace Inhibitors Cough and Other (See Comments)        Celecoxib      upset stomach  Pt is taking med    Aspirin Nausea Only    stomach  upset     Medications Reviewed Today     Reviewed by [name], RN (Registered Nurse) on 11/24/21 at 1054  Med List Status: <None>   Medication Order Taking? Sig Documenting Provider Last Dose Status Informant  Accu-Chek Softclix Lancets lancets [id number] No USE AS INSTRUCTED 3 TIMES A DAY  Patient not taking: Reported on 11/24/2021   [name], MD Not Taking Active Self  albuterol (PROVENTIL) (2.5 MG/3ML) 0.083% nebulizer solution [id number] No USE ONE VIAL (2.5 MG TOTAL) BY NEBULIZATION EVERY 6 (SIX) HOURS AS NEEDED FOR WHEEZING.  Patient not taking: Reported on 11/24/2021   [name], MD Not Taking Active            Med Note [name], [name] Oct 30, 2021  3:51 PM) Using 1-2 times weekly  allopurinol (ZYLOPRIM) 100 MG tablet [id number] Yes TAKE 1 TABLET (100 MG TOTAL) BY MOUTH DAILY.(AM) [name], PA-C Taking Active   atorvastatin (LIPITOR) 40 MG tablet [id number] Yes TAKE 1 TABLET (40 MG TOTAL) BY MOUTH DAILY. (BEDTIME) [name], MD Taking Active   Blood Glucose Monitoring Suppl (ACCU-CHEK AVIVA PLUS) w/Device KIT [id number]  1 each by Does not apply route 3 (three) times daily. [name], [place]  Active Self  clopidogrel (PLAVIX) 75 MG tablet [id number] Yes TAKE 1 TABLET (75 MG TOTAL) BY MOUTH DAILY. (AM) [name], MD Taking Active   diclofenac Sodium (VOLTAREN) 1 % GEL [id number] Yes Apply 2 g topically 4 (four) times daily. [provider]  Taking Active   DULoxetine (CYMBALTA) 60 MG capsule [id number] Yes TAKE 1 CAPSULE (60 MG TOTAL) BY MOUTH DAILY (AM) [name], MD Taking Active   EPINEPHrine 0.3 mg/0.3 mL IJ SOAJ injection [id number]  0.3 mg IM x 1 PRN for allergic reaction  Patient not taking: Reported on 10/30/2021   [name], MD  Active Self  [name] 325 (65 Fe) MG tablet [id number] Yes Take 325 mg by mouth daily. [provider] Taking Active   fluticasone (FLONASE) 50 MCG/ACT nasal spray  [id number] Yes Place 1 spray into both nostrils daily. [name], MD Taking Active Self    Discontinued 04/16/11 0943 Glucagon 1 MG/0.2ML SOSY [id number]  1 mg arrange subcutaneously as needed for severe symptomatic low blood sugar  Patient not taking: Reported on 10/30/2021   [name], MD  Active   glucose blood (ACCU-CHEK AVIVA PLUS) test strip [id number] No Use as instructed  Patient not taking: Reported on 11/24/2021   [name], MD Not Taking Active   glucose chewable tablet 12 g [id number]   [name], MD  Active   glycopyrrolate (ROBINUL) 2 MG tablet [id number] Yes Take 2 mg by mouth 3 (three) times daily. [provider] Taking Active   hydrOXYzine (ATARAX) 10 MG tablet [id number] Yes TAKE 1 TABLET IN THE MORNING AND NOON AND 2 TABLETS IN THE EVENING AS NEEDED. [name], MD Taking Active   Lancets [facility]) lancets [id number] No Use as instructed  Patient not taking: Reported on 11/24/2021   [name] Not Taking Active Self    Discontinued 04/16/11 0916 (Error) loratadine (CLARITIN) 10 MG tablet [id number] Yes TAKE 1 TABLET (10 MG TOTAL) BY MOUTH DAILY. (AM) [name], MD Taking Active   melatonin 5 MG TABS [id number] Yes 1 tablet in the evening [provider] Taking Active   metFORMIN (GLUCOPHAGE-XR) 750 MG 24 hr tablet [id number] Yes TAKE 1 TABLET (750 MG TOTAL) BY MOUTH 2 (TWO) TIMES DAILY (AM+BEDTIME) [name], MD Taking Active   methocarbamol (ROBAXIN) 750 MG tablet [id number] Yes SMARTSIG:1 Tablet(s) By Mouth Every 12 Hours PRN [provider] Taking Active   montelukast (SINGULAIR) 10 MG tablet [id number] Yes Take 1 tablet by mouth daily. [provider] Taking Active   Multiple Vitamins-Minerals (CENTRUM SILVER 50+WOMEN PO) [id number] Yes Take 1 tablet by mouth daily. [provider] Taking Active Self  nicotine (NICODERM CQ - DOSED IN MG/24 HOURS) 14 mg/24hr  patch [id number] No 1 patch to skin  Patient not taking: Reported on 10/21/2021   [provider] Not Taking Active   nicotine (NICODERM CQ - DOSED IN MG/24 HOURS) 14 mg/24hr patch [id number] Yes PLACE 1 PATCH ONTO THE SKIN DAILY [name], MD Taking Active   pantoprazole (PROTONIX) 40 MG tablet [id number] Yes Take 40 mg by mouth 2 (two) times daily. [provider] Taking Active   polyethylene glycol (MIRALAX / GLYCOLAX) 17 g packet [id number] Yes Take 17 g by mouth daily as needed for moderate constipation. [provider] Taking Active Self  potassium chloride SA (KLOR-CON M) 20 MEQ tablet [id number] Yes TAKE ONE TABLET BY MOUTH ONCE DAILY (NOON) [name], MD Taking Active   Respiratory Therapy Supplies (FLUTTER) DEVI [id number] Yes Use after breathing treatment 4 times daily Elsie Stain, MD Taking Active Self  sucralfate (CARAFATE) 1 g tablet [id number] Yes Take 1 g by mouth 4 (four) times daily -  with  meals and at bedtime. [provider] Taking Active Self  SYMBICORT 160-4.5 MCG/ACT inhaler [id number] Yes Inhale 2 puffs into the lungs 2 (two) times daily. [provider] Taking Active   tiotropium (SPIRIVA HANDIHALER) 18 MCG inhalation capsule [id number] Yes PLACE 1 CAPSULE (18 MCG TOTAL) INTO INHALER AND INHALE DAILY. [name], MD Taking Active   tiZANidine (ZANAFLEX) 2 MG tablet [id number] Yes Take 2 mg by mouth 3 (three) times daily. [provider] Taking Active   traMADol (ULTRAM) 50 MG tablet [id number] No 1 tablet as needed  Patient not taking: Reported on 10/30/2021   [provider] Not Taking Active   triamcinolone cream (KENALOG) 0.1 % [id number] Yes Apply 1 application topically 2 (two) times daily. Mayers, Loraine Grip, PA-C Taking Active Self  valsartan-hydrochlorothiazide (DIOVAN-HCT) 160-12.5 MG tablet [id number] Yes TAKE ONE TABLET BY MOUTH ONCE DAILY (AM) [name], MD Taking Active              Patient Active Problem List   Diagnosis Date Noted   Mixed stress and urge urinary incontinence 12/04/2021   Functional fecal incontinence 12/04/2021   IBS (irritable bowel syndrome) 11/19/2021   Spondylolisthesis, lumbar region    Other spondylosis with radiculopathy, lumbar region    Other secondary scoliosis, lumbar region    Fusion of spine of lumbar region 03/25/2021   Colon polyps 06/06/2020   Lupus ([place]) 06/06/2020   Allergic rhinitis due to animal (cat) (dog) hair and dander 04/08/2020   Allergic rhinitis due to pollen 04/08/2020   Food allergy 04/08/2020   Acute medial meniscus tear, left, subsequent encounter 03/21/2020   Chronic pain of left knee 02/15/2020   Paresthesia of skin 11/16/2019   History of total knee replacement, right 11/16/2019   Tobacco abuse 11/16/2019   Centrilobular emphysema ([place]) 08/10/2019   Incidental lung nodule, > 62m and < 88m07/09/2019   OSA on CPAP 08/10/2019   Dyspnea on exertion 08/02/2019   Hyperlipidemia 08/02/2019   Lumbar radiculopathy 04/24/2019   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   Post laminectomy syndrome 02/23/2019   Abnormality of gait 02/23/2019   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   Unilateral primary osteoarthritis, left hip 12/28/2018   Lesion of skin of left ear 12/26/2018   Primary osteoarthritis of left hip 12/02/2018   Iron deficiency anemia 10/16/2018   Chronic pain syndrome 09/08/2018   Chronic pain of right knee 08/11/2018   Status post lumbar laminectomy 07/15/2018   Peripheral arterial disease ([facility]03/04/2018   Moderate persistent asthma without complication 0522/29/7989 Environmental and seasonal allergies 06/29/2017   Controlled type 2 diabetes mellitus with diabetic polyneuropathy, without long-term current use of insulin ([place]05/28/2019   Perennial allergic rhinitis 04/08/2017   Sensorineural hearing loss (SNHL), bilateral 04/08/2017   Chronic pansinusitis 03/25/2017   Eustachian tube  dysfunction, bilateral 0221/19/4174 Lichen planopilaris 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/14/4818 Herniation of lumbar intervertebral disc with radiculopathy 10/02/2016    Class: Chronic   Alopecia areata 08/19/2016   Chondromalacia of both patellae 06/03/2015    Class: Chronic   Spinal stenosis, lumbar region, with neurogenic claudication 06/03/2015   Tobacco use disorder 04/25/2015   DJD (degenerative joint disease) of knee 01/04/2015   Hemorrhoid 11/14/2014   Gout of big toe 07/19/2014   Essential hypertension 08/14/2013   Gastroesophageal reflux disease without esophagitis 08/14/2013   COPD (chronic obstructive pulmonary disease) ([place]03/15/2013    Conditions to be addressed/monitored per PCP order:   depends  There are no care plans that you recently modified to display for this patient.   Follow up:  Patient agrees to Care Plan and Follow-up.  Plan: The Managed Medicaid care management team will reach out to the patient again over the next 30 days.  Date/time of next scheduled Social Work care management/care coordination outreach:  01/12/22  Mickel Fuchs, Arita Miss, [place] Medicaid Team  [phone]

## 2021-12-17 DIAGNOSIS — F331 Major depressive disorder, recurrent, moderate: Secondary | ICD-10-CM | POA: Diagnosis not present

## 2021-12-24 DIAGNOSIS — F331 Major depressive disorder, recurrent, moderate: Secondary | ICD-10-CM | POA: Diagnosis not present

## 2021-12-29 ENCOUNTER — Other Ambulatory Visit: Payer: Medicaid Other | Admitting: *Deleted

## 2021-12-29 NOTE — Patient Outreach (Signed)
  Medicaid Managed Care   Unsuccessful Attempt Note   12/29/2021 Name: Stacy [name] [facility] MRN: [id number] DOB: [birth year]  Referred by: Marcine Matar, MD Reason for referral : High Risk Managed Medicaid (Unsuccessful RNCM follow up telephone outreach)   An unsuccessful telephone outreach was attempted today. The patient was referred to the case management team for assistance with care management and care coordination.    Follow Up Plan: A HIPAA compliant phone message was left for the patient providing contact information and requesting a return call. and The Managed Medicaid care management team will reach out to the patient again over the next 14 days.    [name] RN, BSN [place]  [facility]

## 2021-12-29 NOTE — Patient Instructions (Signed)
Visit Information  [name]  - as a part of your Medicaid benefit, you are eligible for care management and care coordination services at no cost or copay. I was unable to reach you by phone today but would be happy to help you with your health related needs. Please feel free to call me @ [phone].   A member of the Managed Medicaid care management team will reach out to you again over the next 14 days.   [name] RN, BSN [place]  [facility]

## 2021-12-31 ENCOUNTER — Emergency Department (HOSPITAL_COMMUNITY)
Admission: EM | Admit: 2021-12-31 | Discharge: 2022-01-01 | Discharge status: Against Medical Advice | Payer: Medicaid Other | Attending: Emergency Medicine | Admitting: Emergency Medicine

## 2021-12-31 ENCOUNTER — Other Ambulatory Visit: Payer: Self-pay

## 2021-12-31 ENCOUNTER — Emergency Department (HOSPITAL_COMMUNITY): Payer: Medicaid Other

## 2021-12-31 DIAGNOSIS — R609 Edema, unspecified: Secondary | ICD-10-CM | POA: Diagnosis not present

## 2021-12-31 DIAGNOSIS — T68XXXA Hypothermia, initial encounter: Secondary | ICD-10-CM | POA: Diagnosis not present

## 2021-12-31 DIAGNOSIS — M25561 Pain in right knee: Secondary | ICD-10-CM | POA: Diagnosis not present

## 2021-12-31 DIAGNOSIS — Z5321 Procedure and treatment not carried out due to patient leaving prior to being seen by health care provider: Secondary | ICD-10-CM | POA: Insufficient documentation

## 2021-12-31 DIAGNOSIS — M1611 Unilateral primary osteoarthritis, right hip: Secondary | ICD-10-CM | POA: Diagnosis not present

## 2021-12-31 DIAGNOSIS — I959 Hypotension, unspecified: Secondary | ICD-10-CM | POA: Diagnosis not present

## 2021-12-31 MED ORDER — OXYCODONE-ACETAMINOPHEN 5-325 MG PO TABS
1.0000 | ORAL_TABLET | Freq: Once | ORAL | Status: AC
  Administered 2021-12-31: 1 via ORAL
  Filled 2021-12-31: qty 1

## 2021-12-31 MED ORDER — LIDOCAINE-EPINEPHRINE 2 %-1:100000 IJ SOLN
20.0000 mL | Freq: Once | INTRAMUSCULAR | Status: AC
  Administered 2021-12-31: 5 mL via INTRADERMAL
  Filled 2021-12-31: qty 1

## 2021-12-31 MED ORDER — IBUPROFEN 200 MG PO TABS
600.0000 mg | ORAL_TABLET | Freq: Once | ORAL | Status: AC
  Administered 2021-12-31: 600 mg via ORAL
  Filled 2021-12-31: qty 3

## 2021-12-31 NOTE — ED Triage Notes (Signed)
Pt bib ems from home c/o right knee pain x 2 weeks. Hx knee replacement, no injury or trauma.

## 2021-12-31 NOTE — ED Notes (Signed)
Pt given ice chips. Family and visitor are concerned about d/c taking too long as their ride has to leave at midnight. RN notified.

## 2021-12-31 NOTE — ED Provider Triage Note (Signed)
Emergency Medicine Provider Triage Evaluation Note  Stacy [name] , a 61 y.o. female  was evaluated in triage.  Pt complains of right knee pain.  Patient states that knee has been bothering her for the past 2 weeks.  History of right total knee arthroplasty performed on 2017.  Denies any recent falls/traumas.  She states the pain occurred upon awakening around 2 weeks ago.  Pain is worsened with movement and is relieved with rest.  She notes clicking/catching type sensation that causes pain.  She states the pain radiates proximally up to right hip.  Reports history of gout affecting great toe without affecting knee in the past.  Denies fever, chills, night sweats, weakness/sensory deficits infected legs, saddle seizure, bowel/bladder dysfunction  Review of Systems  Positive: . Negative:   Physical Exam  BP (!) 161/83 (BP Location: Right Arm)   Pulse 81   Temp 98.3 F (36.8 C) (Oral)   Resp 15   Ht 5\' 6"  (1.676 m)   Wt 70 kg   LMP 09/01/2010   SpO2 100%   BMI 24.91 kg/m  Gen:   Awake, no distress   Resp:  Normal effort  MSK:   Moves extremities without difficulty  Other:  Tender ovation over right medial knee.  Possible swelling noted in popliteal fossa.  Pedal pulses were intact bilaterally.  No obvious skin abnormalities noted.  Tender palpation of right hip.  Medical Decision Making  Medically screening exam initiated at 3:05 PM.  Appropriate orders placed.  09/03/2010 [place] was informed that the remainder of the evaluation will be completed by another provider, this initial triage assessment does not replace that evaluation, and the importance of remaining in the ED until their evaluation is complete.     [name], Peter Garter 12/31/21 [phone]

## 2021-12-31 NOTE — Discharge Instructions (Signed)
Thank you for coming to [facility] Emergency Department. You were seen for knee pain. We did an exam, labs, and imaging, and these showed possible loosening of your knee replacement hardware. Please use the knee immobilizer/brace for comfort and crutches for ambulation for comfort. You do not need to use these if you do not need them.  Please call your orthopedic surgeon in the morning make an appointment for follow-up.  You can take Tylenol for pain control and rest and ice the affected extremity.  Do not hesitate to return to the ED or call 911 if you experience: -Worsening symptoms -Lightheadedness, passing out -Fevers/chills -Anything else that concerns you

## 2021-12-31 NOTE — ED Provider Notes (Signed)
[facility] DEPT Provider Note   CSN: [id number] Arrival date & time: 12/31/21  1445     History  Chief Complaint  Patient presents with   Knee Pain    Stacy [name] is a 61 y.o. 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    Pt complains of right knee pain.  Patient states that knee has been bothering her for the past 2 weeks.  History of right total knee arthroplasty performed in 2017.  Denies any recent falls/traumas.  She states the pain occurred upon awakening around 2 weeks ago.  Pain is worsened with movement and is relieved with rest.  She notes clicking/catching type sensation that causes pain.  She states the pain radiates proximally up to right hip.  Reports history of gout affecting great toe without affecting knee in the past.  Denies fever, chills, night sweats, weakness/sensory deficits infected legs, saddle anesthesia, bowel/bladder dysfunction.  Per chart review patient saw orthopedic surgery on 11/20/2021 as a follow-up for her lumbar fusion (TLIF L2-L3, L3-L4, and L4-L5 in February 2023).  Takes Cymbalta and methocarbamol.   Knee Pain      Home Medications Prior to Admission medications   Medication Sig Start Date End Date Taking? Authorizing Provider  albuterol (PROVENTIL) (2.5 MG/3ML) 0.083% nebulizer solution USE ONE VIAL (2.5 MG TOTAL) BY NEBULIZATION EVERY 6 (SIX) HOURS AS NEEDED FOR WHEEZING. 11/24/21   [name], MD  allopurinol (ZYLOPRIM) 100 MG tablet TAKE 1 TABLET (100 MG TOTAL) BY MOUTH DAILY.(AM) 09/08/21   [name], PA-C  atorvastatin (LIPITOR) 40 MG tablet TAKE 1 TABLET (40 MG TOTAL) BY MOUTH DAILY.  (BEDTIME) 11/05/21   [name], MD  Blood Glucose Monitoring Suppl (ACCU-CHEK AVIVA PLUS) w/Device KIT 1 each by Does not apply route 3 (three) times daily. 10/20/17   Argentina Donovan, PA-C  clopidogrel (PLAVIX) 75 MG tablet TAKE 1 TABLET (75 MG TOTAL) BY MOUTH DAILY. (AM) 10/07/21   [name], MD  diclofenac Sodium (VOLTAREN) 1 % GEL Apply 2 g topically 4 (four) times daily. 12/02/21   [name], MD  DULoxetine (CYMBALTA) 60 MG capsule TAKE 1 CAPSULE (60 MG TOTAL) BY MOUTH DAILY (AM) 09/01/21   [name], MD  EPINEPHrine 0.3 mg/0.3 mL IJ SOAJ injection 0.3 mg IM x 1 PRN for allergic reaction Patient not taking: Reported on 10/30/2021 05/17/17   [name], MD  [name] 325 (65 Fe) MG tablet Take 325 mg by mouth daily. 11/04/21   [provider]  fluticasone (FLONASE) 50 MCG/ACT nasal spray Place 1 spray into both nostrils daily. 11/24/18   [name], MD  Glucagon 1 MG/0.2ML SOSY 1 mg arrange subcutaneously as needed for severe symptomatic low blood sugar Patient not taking: Reported on 10/30/2021 09/02/21   [name], MD  glucose blood (ACCU-CHEK AVIVA PLUS) test strip Use as instructed 11/24/21   [name], MD  glycopyrrolate (ROBINUL) 2 MG tablet Take 2 mg by mouth 3 (three) times daily. 11/04/21   [provider]  hydrOXYzine (ATARAX) 10 MG tablet TAKE 1 TABLET IN THE MORNING AND NOON AND 2 TABLETS IN THE EVENING AS NEEDED. 12/08/21   [name], MD  Lancets (ACCU-CHEK MULTICLIX) lancets Use as instructed 11/24/21   [name], MD  Lancets (ACCU-CHEK SOFT [facility]) lancets Use as instructed Patient not taking: Reported on 11/24/2021 10/20/17   Argentina Donovan, PA-C  loratadine (CLARITIN) 10 MG tablet TAKE 1 TABLET (10 MG TOTAL) BY MOUTH DAILY. (AM) 09/03/21   [name], MD  melatonin 5 MG TABS 1 tablet in the evening    [provider]  metFORMIN (GLUCOPHAGE-XR) 750 MG 24 hr tablet TAKE 1 TABLET  (750 MG TOTAL) BY MOUTH 2 (TWO) TIMES DAILY (AM+BEDTIME) 11/05/21   [name], MD  methocarbamol (ROBAXIN) 750 MG tablet SMARTSIG:1 Tablet(s) By Mouth Every 12 Hours PRN 12/02/21   [name], MD  montelukast (SINGULAIR) 10 MG tablet Take 1 tablet by mouth daily.    [provider]  Multiple Vitamins-Minerals (CENTRUM SILVER 50+WOMEN PO) Take 1 tablet by mouth daily.    [provider]  nicotine (NICODERM CQ - DOSED IN MG/24 HOURS) 14 mg/24hr patch 1 patch to skin Patient not taking: Reported on 10/21/2021    [provider]  nicotine (NICODERM CQ - DOSED IN MG/24 HOURS) 14 mg/24hr patch PLACE 1 PATCH ONTO THE SKIN DAILY 10/07/21   [name], MD  pantoprazole (PROTONIX) 40 MG tablet Take 40 mg by mouth 2 (two) times daily.    [provider]  polyethylene glycol (MIRALAX / GLYCOLAX) 17 g packet Take 17 g by mouth daily as needed for moderate constipation.    [provider]  potassium chloride SA (KLOR-CON M) 20 MEQ tablet TAKE ONE TABLET BY MOUTH ONCE DAILY (NOON) 11/05/21   [name], MD  Respiratory Therapy Supplies (FLUTTER) DEVI Use after breathing treatment 4 times daily 06/06/18   Elsie Stain, MD  sucralfate (CARAFATE) 1 g tablet Take 1 g by mouth 4 (four) times daily -  with meals and at bedtime.    [provider]  SYMBICORT 160-4.5 MCG/ACT inhaler Inhale 2 puffs into the lungs 2 (two) times daily. 10/04/21   [provider]  tiotropium (SPIRIVA HANDIHALER) 18 MCG inhalation capsule PLACE 1 CAPSULE (18 MCG TOTAL) INTO INHALER AND INHALE DAILY. 04/02/21   [name], MD  tiZANidine (ZANAFLEX) 2 MG tablet Take 2 mg by mouth 3 (three) times daily. 07/03/21   [provider]  traMADol (ULTRAM) 50 MG tablet 1 tablet as needed Patient not taking: Reported on 10/30/2021    [provider]  triamcinolone cream (KENALOG) 0.1 % Apply 1 application topically 2 (two) times daily. 08/27/20    Mayers, Cari S, PA-C  valsartan-hydrochlorothiazide (DIOVAN-HCT) 160-12.5 MG tablet TAKE ONE TABLET BY MOUTH ONCE DAILY (AM) 09/02/21   [name], MD  Fluticasone-Salmeterol (ADVAIR) 500-50 MCG/DOSE AEPB Inhale 1 puff into the lungs every 12 (twelve) hours.    04/16/11  [provider]  lisinopril (PRINIVIL,ZESTRIL) 40 MG tablet Take 40 mg by mouth daily.    04/16/11  [provider]      Allergies    Other, Shellfish allergy, Ace inhibitors, Celecoxib, and Aspirin    Review of Systems   Review of Systems Review of systems Negative for fever.  A 10 point review of systems was performed and is negative unless otherwise reported in HPI.  Physical Exam Updated Vital Signs BP (!) 149/80   Pulse (!) 55   Temp 98.9 F (37.2 C) (Oral)   Resp 20   Ht _0  (1.676 m)   Wt 70  kg   LMP 09/01/2010   SpO2 99%   BMI 24.91 kg/m  Physical Exam General: Normal appearing female, lying in bed.  HEENT: Sclera anicteric, MMM, trachea midline.  Cardiology: RRR, no murmurs/rubs/gallops. BL radial and DP pulses equal bilaterally.  Resp: Normal respiratory rate and effort. CTAB, no wheezes, rhonchi, crackles.  Abd: Soft, non-tender, non-distended. No rebound tenderness or guarding.  GU: Deferred. MSK: Total knee arthroplasty scar of the right knee.  Diffuse warmth and tenderness palpation of the entire right knee.  Mild diffuse swelling compared to the left.  Pain with passive and active range of motion of the right knee.  No tenderness palpation of the right hip or femur.  Intact peripheral pulses bilateral lower extremities and soft compartments of bilateral lower extremities.  No obvious deformities or signs of trauma. Unable to complete anterior/posterior drawer d/t pain. Skin: warm, dry. No rashes or lesions. Neuro: A&Ox4, CNs II-XII grossly intact. MAEs. Sensation grossly intact.  Psych: Normal mood and affect.   ED Results / Procedures / Treatments   Labs (all labs  ordered are listed, but only abnormal results are displayed) Labs Reviewed  BODY FLUID CULTURE W GRAM STAIN  GLUCOSE, BODY FLUID OTHER            PROTEIN, BODY FLUID (OTHER)  SYNOVIAL CELL COUNT + DIFF, W/ CRYSTALS    EKG None  Radiology DG Hip Unilat With Pelvis 2-3 Views Right  Result Date: 12/31/2021 CLINICAL DATA:  Right knee pain for 2 weeks EXAM: DG HIP (WITH OR WITHOUT PELVIS) 2-3V RIGHT; RIGHT KNEE - COMPLETE 4+ VIEW COMPARISON:  01/06/2018, 01/18/2020 FINDINGS: Right hip: Frontal view of the pelvis as well as frontal and frogleg lateral views of the right hip are obtained on 3 images. No acute displaced fracture, subluxation, or dislocation. There is mild right hip osteoarthritis with joint space narrowing and osteophyte formation. Partial visualization of a left hip arthroplasty is unremarkable. Postsurgical changes are seen within the lower lumbar spine. The sacroiliac joints normal. Right knee: Frontal, bilateral oblique, and lateral views are obtained on 4 images. Three component right knee arthroplasty is identified, with increased lucency along the anterior and lateral aspects of the tibial component of the right knee arthroplasty. No acute fracture. No significant joint effusion. Soft tissues are unremarkable. IMPRESSION: 1. Increased lucency along the tibial component of the right knee arthroplasty, suspicious for loosening. 2. No evidence of acute fracture within the right hip or right knee. 3. Mild right hip osteoarthritis. Electronically Signed   By: Randa Ngo M.D.   On: 12/31/2021 15:33   DG Knee Complete 4 Views Right  Result Date: 12/31/2021 CLINICAL DATA:  Right knee pain for 2 weeks EXAM: DG HIP (WITH OR WITHOUT PELVIS) 2-3V RIGHT; RIGHT KNEE - COMPLETE 4+ VIEW COMPARISON:  01/06/2018, 01/18/2020 FINDINGS: Right hip: Frontal view of the pelvis as well as frontal and frogleg lateral views of the right hip are obtained on 3 images. No acute displaced fracture,  subluxation, or dislocation. There is mild right hip osteoarthritis with joint space narrowing and osteophyte formation. Partial visualization of a left hip arthroplasty is unremarkable. Postsurgical changes are seen within the lower lumbar spine. The sacroiliac joints normal. Right knee: Frontal, bilateral oblique, and lateral views are obtained on 4 images. Three component right knee arthroplasty is identified, with increased lucency along the anterior and lateral aspects of the tibial component of the right knee arthroplasty. No acute fracture. No significant joint effusion. Soft tissues are unremarkable. IMPRESSION:  1. Increased lucency along the tibial component of the right knee arthroplasty, suspicious for loosening. 2. No evidence of acute fracture within the right hip or right knee. 3. Mild right hip osteoarthritis. Electronically Signed   By: Randa Ngo M.D.   On: 12/31/2021 15:33    Procedures .Joint Aspiration/Arthrocentesis  Date/Time: 12/31/2021 9:59 PM  Performed by: Audley Hose, MD Authorized by: Audley Hose, MD   Consent:    Consent obtained:  Written   Consent given by:  Patient   Risks, benefits, and alternatives were discussed: yes     Risks discussed:  Bleeding, infection, pain, nerve damage and incomplete drainage   Alternatives discussed:  No treatment Universal protocol:    Procedure explained and questions answered to patient or proxy's satisfaction: yes     Relevant documents present and verified: yes     Test results available: yes     Imaging studies available: yes     Required blood products, implants, devices, and special equipment available: yes     Site/side marked: yes     Immediately prior to procedure, a time out was called: yes     Patient identity confirmed:  Verbally with patient Location:    Location:  Knee   Knee:  R knee Anesthesia:    Anesthesia method:  Local infiltration   Local anesthetic:  Lidocaine 1% WITH epi Procedure details:     Needle gauge:  18 G   Ultrasound guidance: no     Approach:  Medial   Aspirate amount:  4 cc   Aspirate characteristics:  Blood-tinged and serous   Steroid injected: no     Specimen collected: yes   Post-procedure details:    Dressing:  Gauze roll   Procedure completion:  Tolerated with difficulty Comments:     During the procedure patient became slightly lightheaded and hot, stated she felt like she might pass out. Had obtained 4 cc of fluid at this time and was attempting to aspirate more. Patient was on the monitor and her vitals remained stable throughout the episode. The bed was laid down and a cool rag placed on patient's head and she stated she felt much better.     Medications Ordered in ED Medications  ibuprofen (ADVIL) tablet 600 mg (600 mg Oral Given 12/31/21 1530)  oxyCODONE-acetaminophen (PERCOCET/ROXICET) 5-325 MG per tablet 1 tablet (1 tablet Oral Given 12/31/21 1851)  lidocaine-EPINEPHrine (XYLOCAINE W/EPI) 2 %-1:100000 (with pres) injection 20 mL (5 mLs Intradermal Given 12/31/21 2211)  oxyCODONE-acetaminophen (PERCOCET/ROXICET) 5-325 MG per tablet 1 tablet (1 tablet Oral Given 12/31/21 2211)    ED Course/ Medical Decision Making/ A&P                          Medical Decision Making Amount and/or Complexity of Data Reviewed Labs: ordered.  Risk Prescription drug management.    Patient is overall well-appearing though uncomfortable d/t knee pain.  Treating pain with Percocet. Will obtain XR to assess for fracture. No obvious deformity on exam, no dislocation clinically. Consider periprosthetic fracture or issues with arthroplasty.  There is swelling and joint warmth with singular joint pain, consider septic arthritis as possible etiology though no fevers, but will need arthrocentesis to rule it out. Compartments soft, no neurologic complaints, no c/f compartment syndrome.  No e/o tibial plateau fracture on XR. XR does demonstrate possible loosening of  athroplasty, could cause patient's pain.   I have personally reviewed and interpreted all  labs and imaging.  Patient was maintained on a cardiac monitor.  I have personally interpreted the telemetry as NSR.  Imaging: XR shows posslbe loosening of R knee arthroplasty.  For further updates please see ED course.  ED Course:  Clinical Course as of 12/31/21 2357  Wed Dec 31, 2021  2050 Awaiting a room for an arthrocentesis, as patient is currently in a hallway bed [HN]  2158 Arthrocentesis completed [HN]  2355 Still awaiting arthrocentesis results. Patient is ordered for knee immobilizer and crutches for ambulation. She is instructed to call her orthopedic doctor in the AM to make an appointment for further evaluation. [HN]    Clinical Course User Index [HN] Audley Hose, MD   Patient is signed out to the oncoming ED physician who is made aware of her history, presentation, exam, workup, and plan.  Plan is to await arthrocentesis results and treat accordingly. If negative, patient's pain likely due to loose hardware and she is referred to orthopedics for further eval o/p.          Final Clinical Impression(s) / ED Diagnoses Final diagnoses:  Acute pain of right knee    Rx / DC Orders ED Discharge Orders     None        This note was created using dictation software, which may contain spelling or grammatical errors.    Audley Hose, MD 01/07/22 [phone]

## 2022-01-01 ENCOUNTER — Ambulatory Visit (INDEPENDENT_AMBULATORY_CARE_PROVIDER_SITE_OTHER): Payer: Medicaid Other | Admitting: Orthopedic Surgery

## 2022-01-01 ENCOUNTER — Encounter: Payer: Self-pay | Admitting: Orthopedic Surgery

## 2022-01-01 VITALS — BP 138/87 | HR 94 | Ht 66.0 in | Wt 154.0 lb

## 2022-01-01 DIAGNOSIS — Z96651 Presence of right artificial knee joint: Secondary | ICD-10-CM | POA: Diagnosis not present

## 2022-01-01 DIAGNOSIS — S8991XA Unspecified injury of right lower leg, initial encounter: Secondary | ICD-10-CM | POA: Diagnosis not present

## 2022-01-01 DIAGNOSIS — T8484XD Pain due to internal orthopedic prosthetic devices, implants and grafts, subsequent encounter: Secondary | ICD-10-CM | POA: Diagnosis not present

## 2022-01-01 LAB — SYNOVIAL CELL COUNT + DIFF, W/ CRYSTALS
Crystals, Fluid: NONE SEEN
Eosinophils-Synovial: 0 % (ref 0–1)
Lymphocytes-Synovial Fld: 26 % — ABNORMAL HIGH (ref 0–20)
Monocyte-Macrophage-Synovial Fluid: 15 % — ABNORMAL LOW (ref 50–90)
Neutrophil, Synovial: 55 % — ABNORMAL HIGH (ref 0–25)
Other Cells-SYN: 4
WBC, Synovial: UNDETERMINED /mm3 (ref 0–200)

## 2022-01-01 NOTE — ED Provider Notes (Signed)
Informed by nursing that the patient was left.  I was unable to speak with patient prior to her leaving.  It does appear that her Gram stain has resulted with no white cells or bacteria seen.  Otherwise synovial fluid labs are still pending.  Instructed nursing to mark her AMA.    Shon Baton, MD 01/01/22 [phone]

## 2022-01-01 NOTE — Progress Notes (Signed)
Orthopedic Spine Surgery Office Note  Assessment: Patient is a 61 y.o. female s/p right TKA Stacy [name] - 2017), now 1 month of knee pain and swelling   Plan: -Work up for PJI. Aspiration at ER showed (no crystals, no organisms, unable to do WBC because of clot) -Ordered ESR/CRP/CBC today -Will follow culture results -Will likely repeat aspiration to get a WBC in the synovial fluid at our next visit. Will consider bone scan to work up the lucency further around the tibia component -Patient should return to office in 2 weeks, repeat x-rays of lumbar spine at next visit: none   Patient expressed understanding of the plan and all questions were answered to the patient's satisfaction.   ___________________________________________________________________________   History:  Patient is a 61 y.o. female who presents today for right knee pain. Patient had a right TKA with Dr. Louanne [name] in 2017. She had been doing well after that surgery and was ambulating without issue. In the last month, she has noticed increasing pain in the right knee. There was no trauma or injury that brought on the pain. Pain has been getting progressively worse. Pain is felt with any range of motion through the knee. She feels that the knee is swollen. She has not been on any antibiotics recently. She states she has not had any recent illness, cold, coughs, UTIs, or other infections. She went to the ER yesterday because the pain was getting so bad. She was given a knee immobilizer and an aspiration was performed. She comes in today to talk about her knee further. Denies paresthesias and numbness in the right lower extremity. Denies fevers/chills, malaise, night sweats, drainage from knee.  Treatments tried: activity modification, knee immobilizer, NSAIDs, tylenol  Review of systems: Denies fevers and chills, night sweats, unexplained weight loss, history of cancer, pain that wakes them at night  Past medical  history: HLD HTN Lupus Depression  Anxiety GERD Osteoporosis Irritable bowel syndrome COPD Neuropathy Diabetes Rheumatoid arthritis Peripheral artery disease Sleep apnea Vertigo Chronic pain Gout  Allergies: aspirin, celecoxib, ACEi  Past surgical history:  Bilateral TKA Cholecystectomy Knee arthroscopy Hernia repair Lacrimal tube insertion Back surgery x3 Left THA Tubal ligation  Social history: Reports use of nicotine product (smoking, vaping, patches, smokeless) - 2 packs per week Alcohol use: denies Denies recreational drug use   Physical Exam:  General: no acute distress, appears stated age Neurologic: alert, answering questions appropriately, following commands Respiratory: unlabored breathing on room air, symmetric chest rise Psychiatric: appropriate affect, normal cadence to speech   MSK (RLE): -Swelling on right knee when compared to contralateral side -TTP over the medial joint line -Pain with arc of motion greater than 30 degrees -Able to extend knee against gravity -EHL/TA/GSC intact -SILT in s/s/dp/sp/t nerve distributions -Foot warm and well perfused -No sinus track   Imaging: XR of the right knee from 12/31/2021 was independently reviewed and interpreted, showing no fracture or dislocation. Lucency around the tibia component baseplate. No lucency around the femoral component.    Patient name: Stacy [name] Patient MRN: [id number] Date of visit: 01/01/22

## 2022-01-01 NOTE — ED Notes (Signed)
Lab called to inquire on the cell count.  The sample is thick so they are having to work with the sample.

## 2022-01-01 NOTE — ED Notes (Signed)
Patient had to leave due to her ride leaving.  MD notified of same

## 2022-01-02 LAB — CBC WITH DIFFERENTIAL/PLATELET
Absolute Monocytes: 476 cells/uL (ref 200–950)
Basophils Absolute: 33 cells/uL (ref 0–200)
Basophils Relative: 0.4 %
Eosinophils Absolute: 74 cells/uL (ref 15–500)
Eosinophils Relative: 0.9 %
HCT: 36.9 % (ref 35.0–45.0)
Hemoglobin: 12.1 g/dL (ref 11.7–15.5)
Lymphs Abs: 2993 cells/uL (ref 850–3900)
MCH: 27.6 pg (ref 27.0–33.0)
MCHC: 32.8 g/dL (ref 32.0–36.0)
MCV: 84.1 fL (ref 80.0–100.0)
MPV: 11.1 fL (ref 7.5–12.5)
Monocytes Relative: 5.8 %
Neutro Abs: 4625 cells/uL (ref 1500–7800)
Neutrophils Relative %: 56.4 %
Platelets: 319 10*3/uL (ref 140–400)
RBC: 4.39 10*6/uL (ref 3.80–5.10)
RDW: 14.1 % (ref 11.0–15.0)
Total Lymphocyte: 36.5 %
WBC: 8.2 10*3/uL (ref 3.8–10.8)

## 2022-01-02 LAB — SEDIMENTATION RATE: Sed Rate: 14 mm/h (ref 0–30)

## 2022-01-02 LAB — GLUCOSE, BODY FLUID OTHER: Glucose, Body Fluid Other: 52 mg/dL

## 2022-01-02 LAB — C-REACTIVE PROTEIN: CRP: 0.5 mg/L (ref <8.0)

## 2022-01-04 LAB — BODY FLUID CULTURE W GRAM STAIN
Culture: NO GROWTH
Gram Stain: NONE SEEN
Special Requests: NORMAL

## 2022-01-05 ENCOUNTER — Other Ambulatory Visit: Payer: Self-pay | Admitting: Internal Medicine

## 2022-01-05 ENCOUNTER — Telehealth: Payer: Self-pay | Admitting: Internal Medicine

## 2022-01-05 ENCOUNTER — Encounter: Payer: Self-pay | Admitting: Internal Medicine

## 2022-01-05 ENCOUNTER — Ambulatory Visit: Payer: Medicaid Other | Attending: Internal Medicine | Admitting: Internal Medicine

## 2022-01-05 VITALS — BP 108/71 | HR 88 | Ht 66.0 in | Wt 158.0 lb

## 2022-01-05 DIAGNOSIS — E1142 Type 2 diabetes mellitus with diabetic polyneuropathy: Secondary | ICD-10-CM

## 2022-01-05 DIAGNOSIS — E785 Hyperlipidemia, unspecified: Secondary | ICD-10-CM | POA: Diagnosis not present

## 2022-01-05 DIAGNOSIS — E1159 Type 2 diabetes mellitus with other circulatory complications: Secondary | ICD-10-CM

## 2022-01-05 DIAGNOSIS — Z87891 Personal history of nicotine dependence: Secondary | ICD-10-CM

## 2022-01-05 DIAGNOSIS — E1169 Type 2 diabetes mellitus with other specified complication: Secondary | ICD-10-CM | POA: Diagnosis not present

## 2022-01-05 DIAGNOSIS — M79671 Pain in right foot: Secondary | ICD-10-CM | POA: Diagnosis not present

## 2022-01-05 DIAGNOSIS — I152 Hypertension secondary to endocrine disorders: Secondary | ICD-10-CM | POA: Diagnosis not present

## 2022-01-05 DIAGNOSIS — I739 Peripheral vascular disease, unspecified: Secondary | ICD-10-CM

## 2022-01-05 DIAGNOSIS — E663 Overweight: Secondary | ICD-10-CM

## 2022-01-05 DIAGNOSIS — Z23 Encounter for immunization: Secondary | ICD-10-CM

## 2022-01-05 DIAGNOSIS — E11649 Type 2 diabetes mellitus with hypoglycemia without coma: Secondary | ICD-10-CM | POA: Diagnosis not present

## 2022-01-05 LAB — POCT GLYCOSYLATED HEMOGLOBIN (HGB A1C): HbA1c, POC (controlled diabetic range): 6.6 % (ref 0.0–7.0)

## 2022-01-05 MED ORDER — COLCHICINE 0.6 MG PO TABS: ORAL_TABLET | ORAL | 0 refills | Status: DC

## 2022-01-05 NOTE — Progress Notes (Signed)
Pain in right knee and right foot.

## 2022-01-05 NOTE — Telephone Encounter (Signed)
Pt wants Dr Laural Benes to know they sent her the wrong brief, (size large) and she wears a medium. So you will be getting another order for her briefs

## 2022-01-05 NOTE — Progress Notes (Signed)
Patient ID: Stacy [name] [facility], female    DOB: Jul 04, 1960  MRN: [id number]  CC: Diabetes (DM f/u. Gout in foot. )   Subjective: Stacy [name] is a 61 y.o. female who presents for chronic ds management.  Stacy [name], is with her.  Her concerns today include:  hx of HTN, DM with neuropathy, aortic atherosclerosis, tob dep, HL, PAD, IDA, lupus, spinal stenosis with neurogenic claudication (s/p laminectomy 07/2018), COPD,OSA on CPAP,LS, OA knees, gout, recurrent sinusitis, receiving allergy shots from Dr. Faith Rogue.    C/o gout RT foot.  Pain started 2 days after seen in ER 12/31/2021 for RT Knee pain.  Pain is around the heel.  Patient states that it hurts to even have a cover on her foot at nights.  She has not had any redness or swelling.  No fever. Seen in the ER 12/31/2021 for pain in the right knee.  Joint aspiration done revealed no crystals or organisms.  She followed up with her orthopedic specialist the next day.  Sed rate, CRP and CBC normal.  She continues to have pain in the knee.  She has a follow-up appointment with him on the 13th of this month for this.  DM: Results for orders placed or performed in visit on 01/05/22  POCT glycosylated hemoglobin (Hb A1C)  Result Value Ref Range   Hemoglobin A1C     HbA1c POC (<> result, manual entry)     HbA1c, POC (prediabetic range)     HbA1c, POC (controlled diabetic range) 6.6 0.0 - 7.0 %   *Note: Due to a large number of results and/or encounters for the requested time period, some results have not been displayed. A complete set of results can be found in Results Review.   Checks blood sugar twice a day.  Has log book with her.  Blood sugars before breakfast range 99-130 with highest being 160.  Blood sugar at bedtimes range 90-130. Compliant with metformin XR 750 mg twice a day.  Amaryl discontinued on last visit due to hypoglycemia. Doing well with eating habits.  BMI today is 25 with weight up 2 pounds from last in  person visit with me in August. Taking and tolerating Lipitor 40 mg daily for cholesterol. She checks blood pressure daily.  Blood pressure was running high the end of last month which she attributes to the pain in the knee.  However last 5 readings have been good.  These readings were 135/70, 120/80, 130/70 and 124/80.  She limits salt in the foods. She has remained free of cigarettes now for 4 months.  HM: Due for second shingles vaccine.  Due for Tdap. Patient Active Problem List   Diagnosis Date Noted   Mixed stress and urge urinary incontinence 12/04/2021   Functional fecal incontinence 12/04/2021   IBS (irritable bowel syndrome) 11/19/2021   Spondylolisthesis, lumbar region    Other spondylosis with radiculopathy, lumbar region    Other secondary scoliosis, lumbar region    Fusion of spine of lumbar region 03/25/2021   Colon polyps 06/06/2020   Lupus (Stacy [name]) 06/06/2020   Allergic rhinitis due to animal (cat) (dog) hair and dander 04/08/2020   Allergic rhinitis due to pollen 04/08/2020   Food allergy 04/08/2020   Acute medial meniscus tear, left, subsequent encounter 03/21/2020   Chronic pain of left knee 02/15/2020   Paresthesia of skin 11/16/2019   History of total knee replacement, right 11/16/2019   Tobacco abuse 11/16/2019   Centrilobular emphysema (Stacy [name]) 08/10/2019  Incidental lung nodule, > 5m and < 871m07/09/2019   OSA on CPAP 08/10/2019   Dyspnea on exertion 08/02/2019   Hyperlipidemia 08/02/2019   Lumbar radiculopathy 04/24/2019   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   Post laminectomy syndrome 02/23/2019   Abnormality of gait 02/23/2019   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   Unilateral primary osteoarthritis, left hip 12/28/2018   Lesion of skin of left ear 12/26/2018   Primary osteoarthritis of left hip 12/02/2018   Iron deficiency anemia 10/16/2018   Chronic pain syndrome 09/08/2018   Chronic pain of right knee 08/11/2018   Status post lumbar  laminectomy 07/15/2018   Peripheral arterial disease ([facility] [name]/04/2018   Moderate persistent asthma without complication 0544/62/8638 Environmental and seasonal allergies 06/29/2017   Controlled type 2 diabetes mellitus with diabetic polyneuropathy, without long-term current use of insulin (HCIvins05/28/2019   Perennial allergic rhinitis 04/08/2017   Sensorineural hearing loss (SNHL), bilateral 04/08/2017   Chronic pansinusitis 03/25/2017   Eustachian tube dysfunction, bilateral 0217/71/1657 Lichen planopilaris 0990/38/3338 Herniation of lumbar intervertebral disc with radiculopathy 10/02/2016    Class: Chronic   Alopecia areata 08/19/2016   Chondromalacia of both patellae 06/03/2015    Class: Chronic   Spinal stenosis, lumbar region, with neurogenic claudication 06/03/2015   Tobacco use disorder 04/25/2015   DJD (degenerative joint disease) of knee 01/04/2015   Hemorrhoid 11/14/2014   Gout of big toe 07/19/2014   Essential hypertension 08/14/2013   Gastroesophageal reflux disease without esophagitis 08/14/2013   COPD (chronic obstructive pulmonary disease) (HCPahoa03/15/2013     Current Outpatient Medications on File Prior to Visit  Medication Sig Dispense Refill   albuterol (PROVENTIL) (2.5 MG/3ML) 0.083% nebulizer solution USE ONE VIAL (2.5 MG TOTAL) BY NEBULIZATION EVERY 6 (SIX) HOURS AS NEEDED FOR WHEEZING. 360 mL 2   allopurinol (ZYLOPRIM) 100 MG tablet TAKE 1 TABLET (100 MG TOTAL) BY MOUTH DAILY.(AM) 90 tablet 3   atorvastatin (LIPITOR) 40 MG tablet TAKE 1 TABLET (40 MG TOTAL) BY MOUTH DAILY. (BEDTIME) 90 tablet 1   Blood Glucose Monitoring Suppl (ACCU-CHEK AVIVA PLUS) w/Device KIT 1 each by Does not apply route 3 (three) times daily. 1 kit 0   clopidogrel (PLAVIX) 75 MG tablet TAKE 1 TABLET (75 MG TOTAL) BY MOUTH DAILY. (AM) 30 tablet 2   diclofenac Sodium (VOLTAREN) 1 % GEL Apply 2 g topically 4 (four) times daily. 350 g 5   DULoxetine (CYMBALTA) 60 MG capsule TAKE 1 CAPSULE (60  MG TOTAL) BY MOUTH DAILY (AM) 30 capsule 6   EPINEPHrine 0.3 mg/0.3 mL IJ SOAJ injection 0.3 mg IM x 1 PRN for allergic reaction 1 Device 1   [name] 325 (65 Fe) MG tablet Take 325 mg by mouth daily.     fluticasone (FLONASE) 50 MCG/ACT nasal spray Place 1 spray into both nostrils daily. 16 g 6   Glucagon 1 MG/0.2ML SOSY 1 mg arrange subcutaneously as needed for severe symptomatic low blood sugar 0.2 mL 0   glucose blood (ACCU-CHEK AVIVA PLUS) test strip Use as instructed 100 each 12   glycopyrrolate (ROBINUL) 2 MG tablet Take 2 mg by mouth 3 (three) times daily.     hydrOXYzine (ATARAX) 10 MG tablet TAKE 1 TABLET IN THE MORNING AND NOON AND 2 TABLETS IN THE EVENING AS NEEDED. 90 tablet 0   Lancets (ACCU-CHEK MULTICLIX) lancets Use as instructed 100 each 12   Lancets (ACCU-CHEK SOFT TOUCH) lancets Use as instructed 100 each  12   loratadine (CLARITIN) 10 MG tablet TAKE 1 TABLET (10 MG TOTAL) BY MOUTH DAILY. (AM) 90 tablet 1   melatonin 5 MG TABS 1 tablet in the evening     metFORMIN (GLUCOPHAGE-XR) 750 MG 24 hr tablet TAKE 1 TABLET (750 MG TOTAL) BY MOUTH 2 (TWO) TIMES DAILY (AM+BEDTIME) 180 tablet 1   methocarbamol (ROBAXIN) 750 MG tablet SMARTSIG:1 Tablet(s) By Mouth Every 12 Hours PRN 30 tablet 1   montelukast (SINGULAIR) 10 MG tablet Take 1 tablet by mouth daily.     Multiple Vitamins-Minerals (CENTRUM SILVER 50+WOMEN PO) Take 1 tablet by mouth daily.     pantoprazole (PROTONIX) 40 MG tablet Take 40 mg by mouth 2 (two) times daily.     potassium chloride SA (KLOR-CON M) 20 MEQ tablet TAKE ONE TABLET BY MOUTH ONCE DAILY (NOON) 90 tablet 1   Respiratory Therapy Supplies (FLUTTER) DEVI Use after breathing treatment 4 times daily 1 each 0   sucralfate (CARAFATE) 1 g tablet Take 1 g by mouth 4 (four) times daily -  with meals and at bedtime.     SYMBICORT 160-4.5 MCG/ACT inhaler Inhale 2 puffs into the lungs 2 (two) times daily.     tiotropium (SPIRIVA HANDIHALER) 18 MCG inhalation capsule PLACE 1  CAPSULE (18 MCG TOTAL) INTO INHALER AND INHALE DAILY. 30 capsule 1   tiZANidine (ZANAFLEX) 2 MG tablet Take 2 mg by mouth 3 (three) times daily.     traMADol (ULTRAM) 50 MG tablet      triamcinolone cream (KENALOG) 0.1 % Apply 1 application topically 2 (two) times daily. 30 g 0   valsartan-hydrochlorothiazide (DIOVAN-HCT) 160-12.5 MG tablet TAKE ONE TABLET BY MOUTH ONCE DAILY (AM) 90 tablet 1   nicotine (NICODERM CQ - DOSED IN MG/24 HOURS) 14 mg/24hr patch 1 patch to skin (Patient not taking: Reported on 10/21/2021)     [DISCONTINUED] Fluticasone-Salmeterol (ADVAIR) 500-50 MCG/DOSE AEPB Inhale 1 puff into the lungs every 12 (twelve) hours.       [DISCONTINUED] lisinopril (PRINIVIL,ZESTRIL) 40 MG tablet Take 40 mg by mouth daily.       Current Facility-Administered Medications on File Prior to Visit  Medication Dose Route Frequency Provider Last Rate Last Admin   glucose chewable tablet 12 g  3 tablet Oral Once [name], MD        Allergies  Allergen Reactions   Other Shortness Of Breath    UNSPECIFIED AGENTS Allergic to perfumes and cleaning products   Shellfish Allergy Anaphylaxis    Per allergy test.   Ace Inhibitors Cough and Other (See Comments)        Celecoxib      upset stomach  Pt is taking med    Aspirin Nausea Only    stomach upset     Social History   Socioeconomic History   Marital status: Married    Spouse name: Not on file   Number of children: 2   Years of education: Not on file   Highest education level: Not on file  Occupational History   Occupation: unemployed    Employer: UNEMPLOYED  Tobacco Use   Smoking status: Former   Smokeless tobacco: Never  Scientific laboratory technician Use: Never used  Substance and Sexual Activity   Alcohol use: No   Drug use: No   Sexual activity: Yes    Birth control/protection: Surgical, Post-menopausal    Comment: tubal ligation  Other Topics Concern   Not on file  Social History Narrative  Left Handed    Lives  in a two story apartment.    Drinks Caffeine    Social Determinants of Health   Financial Resource Strain: Not on file  Food Insecurity: No Food Insecurity (03/13/2021)   Hunger Vital Sign    Worried About Running Out of Food in the Last Year: Never true    Ran Out of Food in the Last Year: Never true  Transportation Needs: No Transportation Needs (10/21/2021)   PRAPARE - Hydrologist (Medical): No    Lack of Transportation (Non-Medical): No  Physical Activity: Not on file  Stress: Not on file  Social Connections: Not on file  Intimate Partner Violence: Not on file    Family History  Problem Relation Age of Onset   Hypertension Father    Cancer Father    Heart disease Mother    Asthma Son        had as a child   Heart disease Sister    Breast cancer Sister    Hypertension Brother     Past Surgical History:  Procedure Laterality Date   CHOLECYSTECTOMY     COLONOSCOPY     ENDOMETRIAL ABLATION  10/2010   HERNIA REPAIR     umbicial hernia   JOINT REPLACEMENT Left 03/14/2019   Dr. Ninfa Linden hip   KNEE ARTHROSCOPY Left    06/07/2017 Dr. Marlou Sa of [place] ARTHROSCOPY Left 03/21/2020   Procedure: LEFT KNEE ARTHROSCOPY WITH PARTIAL MEDIAL MENISCECTOMY;  Surgeon: Mcarthur Rossetti, MD;  Location: [place];  Service: Orthopedics;  Laterality: Left;   KNEE CLOSED REDUCTION Right 12/06/2015   Procedure: CLOSED MANIPULATION RIGHT KNEE;  Surgeon: [name], MD;  Location: [place];  Service: Orthopedics;  Laterality: Right;   KNEE CLOSED REDUCTION Right 01/17/2016   Procedure: CLOSED MANIPULATION RIGHT KNEE;  Surgeon: [name], MD;  Location: [place];  Service: Orthopedics;  Laterality: Right;   KNEE JOINT MANIPULATION Right 12/06/2015   LACRIMAL TUBE INSERTION Bilateral 03/01/2019   Procedure: LACRIMAL TUBE INSERTION;  Surgeon: Gevena Cotton, MD;  Location[facility];  Service: Ophthalmology;   Laterality: Bilateral;   LACRIMAL TUBE REMOVAL Bilateral 05/03/2019   Procedure: BILATERAL NASOLACRIMAL DUCT PROBING, IIRIGATION AND TUBE REMOVAL BOTH EYES;  Surgeon: Gevena Cotton, MD;  Location: [place];  Service: Ophthalmology;  Laterality: Bilateral;   LUMBAR LAMINECTOMY/DECOMPRESSION MICRODISCECTOMY N/A 06/03/2015   Procedure: Bilateral lateral recess decompression L2-3, L3-4, L4-5;  Surgeon: [name], MD;  Location: [place];  Service: Orthopedics;  Laterality: N/A;   LUMBAR LAMINECTOMY/DECOMPRESSION MICRODISCECTOMY N/A 10/02/2016   Procedure: Right L5-S1 Lateral Recess Decompression  microdiscectomy;  Surgeon: [name], MD;  Location: [place];  Service: Orthopedics;  Laterality: N/A;   LUMBAR LAMINECTOMY/DECOMPRESSION MICRODISCECTOMY N/A 07/15/2018   Procedure: LEFT L3-4 MICRODISCECTOMY;  Surgeon: [name], MD;  Location: [place];  Service: Orthopedics;  Laterality: N/A;   svd      x 2   TEAR DUCT PROBING Bilateral 03/01/2019   Procedure: TEAR DUCT PROBING WITH IRRIGATION;  Surgeon: Gevena Cotton, MD;  Location[facility];  Service: Ophthalmology;  Laterality: Bilateral;   TOTAL HIP ARTHROPLASTY Left 03/14/2019   Procedure: LEFT TOTAL HIP ARTHROPLASTY ANTERIOR APPROACH;  Surgeon: Mcarthur Rossetti, MD;  Location: [place];  Service: Orthopedics;  Laterality: Left;   TOTAL KNEE ARTHROPLASTY Right 09/06/2015   Procedure: RIGHT TOTAL KNEE ARTHROPLASTY;  Surgeon: Daleen Bo  [name], MD;  Location: [place];  Service: Orthopedics;  Laterality: Right;   TUBAL LIGATION     UPPER GASTROINTESTINAL ENDOSCOPY  04/28/2011    ROS: Review of Systems Negative except as stated above  PHYSICAL EXAM: BP 108/71   Pulse 88   Ht _0  (1.676 m)   Wt 158 lb (71.7 kg)   LMP 09/01/2010   SpO2 97%   BMI 25.50 kg/m   Wt Readings from Last 3 Encounters:  01/05/22 158 lb (71.7 kg)  01/01/22 154 lb (69.9 kg)  12/31/21 154 lb 5.2 oz (70 kg)    Physical  Exam  General appearance -patient sitting in chair and appears uncomfortable due to pain in her foot.  She is wearing a Velcro knee brace around the right knee.  This was not removed. Mental status - normal mood, behavior, speech, dress, motor activity, and thought processes Neck - supple, no significant adenopathy Chest - clear to auscultation, no wheezes, rales or rhonchi, symmetric air entry Heart - normal rate, regular rhythm, normal S1, S2, no murmurs, rubs, clicks or gallops Extremities -no lower extremity edema. MSK: Right foot -no edema or erythema noted.  She has mild tenderness on palpation around the heel.  Mild discomfort with passive range of motion.      Latest Ref Rng & Units 03/26/2021    3:11 PM 03/20/2021    8:43 AM 01/20/2021    3:44 PM  CMP  Glucose 70 - 99 mg/dL 162  50  56   BUN 8 - 23 mg/dL _1 Creatinine 0.44 - 1.00 mg/dL 1.06  0.86  0.94   Sodium 135 - 145 mmol/L 132  137  139   Potassium 3.5 - 5.1 mmol/L 3.8  4.6  4.2   Chloride 98 - 111 mmol/L 95  99  102   CO2 22 - 32 mmol/L _2 Calcium 8.9 - 10.3 mg/dL 8.7  10.0  9.8   Total Protein 6.0 - 8.5 g/dL   7.1   Total Bilirubin 0.0 - 1.2 mg/dL   <0.2   Alkaline Phos 44 - 121 IU/L   113   AST 0 - 40 IU/L   16   ALT 0 - 32 IU/L   13    Lipid Panel     Component Value Date/Time   CHOL 160 01/20/2021 1544   TRIG 91 01/20/2021 1544   HDL 49 01/20/2021 1544   CHOLHDL 3.3 01/20/2021 1544   CHOLHDL 4.2 07/19/2014 1202   VLDL 24 07/19/2014 1202   LDLCALC 94 01/20/2021 1544    CBC    Component Value Date/Time   WBC 8.2 01/01/2022 1644   RBC 4.39 01/01/2022 1644   HGB 12.1 01/01/2022 1644   HGB 11.3 05/01/2021 1526   HCT 36.9 01/01/2022 1644   HCT 34.7 05/01/2021 1526   PLT 319 01/01/2022 1644   PLT 297 05/01/2021 1526   MCV 84.1 01/01/2022 1644   MCV 81 05/01/2021 1526   MCH 27.6 01/01/2022 1644   MCHC 32.8 01/01/2022 1644   RDW 14.1 01/01/2022 1644   RDW 15.7 (H) 05/01/2021 1526    LYMPHSABS 2,993 01/01/2022 1644   LYMPHSABS 4.1 (H) 10/14/2018 1704   MONOABS 0.7 03/28/2021 1116   EOSABS 74 01/01/2022 1644   EOSABS 0.1 10/14/2018 1704   BASOSABS 33 01/01/2022 1644   BASOSABS 0.0 10/14/2018 1704    ASSESSMENT AND PLAN: 1. Type 2 diabetes mellitus  with peripheral neuropathy ([place]) At goal.  Continue metformin and good eating habits. - POCT glycosylated hemoglobin (Hb A1C) - Microalbumin / creatinine urine ratio - Comprehensive metabolic panel - Lipid panel  2. Hypertension associated with diabetes ([place]) At goal.  Continue Diovan/HCTZ  3. Foot pain, right Questionable gout.  We will give a trial of colchicine.  Held off on prescribing prednisone so as not to increase blood sugars.  Advised that colchicine may cause diarrhea. - colchicine 0.6 MG tablet; 2 tabs PO x 1 today then 1 tab PO daily x 4 days  Dispense: 6 tablet; Refill: 0 - Uric Acid  4. Hyperlipidemia associated with type 2 diabetes mellitus (HCC) Continue atorvastatin. - Lipid panel  5. Former smoker Commended on quitting and encouraged her to remain tobacco free.  6. Over weight See #1 above.  7. Need for shingles vaccine Shingrix given today.  8. Need for Tdap vaccination Schedule follow-up visit in 2 weeks with clinical pharmacist for Tdap vaccine.     Patient was given the opportunity to ask questions.  Patient verbalized understanding of the plan and was able to repeat key elements of the plan.   This documentation was completed using Radio producer.  Any transcriptional errors are unintentional.  Orders Placed This Encounter  Procedures   Varicella-zoster vaccine IM   Microalbumin / creatinine urine ratio   Comprehensive metabolic panel   Lipid panel   Uric Acid   POCT glycosylated hemoglobin (Hb A1C)     Requested Prescriptions   Signed Prescriptions Disp Refills   colchicine 0.6 MG tablet 6 tablet 0    Sig: 2 tabs PO x 1 today then 1 tab PO  daily x 4 days    Return in about 4 months (around 05/07/2022) for Give appt with [facility] in 2 wks for Tdapt vaccine.  Karle Plumber, MD, FACP

## 2022-01-05 NOTE — Telephone Encounter (Signed)
Routing to PCP for review.

## 2022-01-06 ENCOUNTER — Other Ambulatory Visit: Payer: Self-pay | Admitting: Internal Medicine

## 2022-01-06 DIAGNOSIS — Z1231 Encounter for screening mammogram for malignant neoplasm of breast: Secondary | ICD-10-CM

## 2022-01-06 LAB — LIPID PANEL
Chol/HDL Ratio: 2.8 ratio (ref 0.0–4.4)
Cholesterol, Total: 132 mg/dL (ref 100–199)
HDL: 47 mg/dL (ref >39)
LDL Chol Calc (NIH): 64 mg/dL (ref 0–99)
Triglycerides: 116 mg/dL (ref 0–149)
VLDL Cholesterol Cal: 21 mg/dL (ref 5–40)

## 2022-01-06 LAB — COMPREHENSIVE METABOLIC PANEL
ALT: 11 IU/L (ref 0–32)
AST: 17 IU/L (ref 0–40)
Albumin/Globulin Ratio: 1.6 (ref 1.2–2.2)
Albumin: 4.7 g/dL (ref 3.9–4.9)
Alkaline Phosphatase: 114 IU/L (ref 44–121)
BUN/Creatinine Ratio: 12 (ref 12–28)
BUN: 10 mg/dL (ref 8–27)
Bilirubin Total: 0.2 mg/dL (ref 0.0–1.2)
CO2: 26 mmol/L (ref 20–29)
Calcium: 10.1 mg/dL (ref 8.7–10.3)
Chloride: 100 mmol/L (ref 96–106)
Creatinine, Ser: 0.86 mg/dL (ref 0.57–1.00)
Globulin, Total: 3 g/dL (ref 1.5–4.5)
Glucose: 85 mg/dL (ref 70–99)
Potassium: 4.4 mmol/L (ref 3.5–5.2)
Sodium: 140 mmol/L (ref 134–144)
Total Protein: 7.7 g/dL (ref 6.0–8.5)
eGFR: 76 mL/min/{1.73_m2} (ref >59)

## 2022-01-06 LAB — MICROALBUMIN / CREATININE URINE RATIO
Creatinine, Urine: 71.9 mg/dL
Microalb/Creat Ratio: 6 mg/g creat (ref 0–29)
Microalbumin, Urine: 4.5 ug/mL

## 2022-01-06 LAB — URIC ACID: Uric Acid: 4.1 mg/dL (ref 3.0–7.2)

## 2022-01-07 ENCOUNTER — Encounter: Payer: Self-pay | Admitting: *Deleted

## 2022-01-07 ENCOUNTER — Other Ambulatory Visit: Payer: Medicaid Other | Admitting: *Deleted

## 2022-01-07 NOTE — Patient Outreach (Signed)
Medicaid Managed Care   Nurse Care Manager Note  01/07/2022 Name:  Stacy [name] [facility] MRN:  [id number] DOB:  30-Apr-1960  Stacy [name] is an 61 y.o. year old female who is a primary patient of Stacy Pier, MD.  The [facility] Managed Care Coordination team was consulted for assistance with:    DMII  Stacy [name] was given information about Medicaid Managed Care Coordination team services today. Stacy [name] Patient agreed to services and verbal consent obtained.  Engaged with patient by telephone for follow up visit in response to provider referral for case management and/or care coordination services.   Assessments/Interventions:  Review of past medical history, allergies, medications, health status, including review of consultants reports, laboratory and other test data, was performed as part of comprehensive evaluation and provision of chronic care management services.  SDOH (Social Determinants of Health) assessments and interventions performed: SDOH Interventions    Flowsheet Row Patient Outreach Telephone from 01/07/2022 in [place] Patient Outreach Telephone from 10/21/2021 in [place] Coordination  SDOH Interventions    Housing Interventions -- Intervention Not Indicated  Transportation Interventions -- Intervention Not Indicated  Alcohol Usage Interventions Intervention Not Indicated (Score <7) --       Care Plan  Allergies  Allergen Reactions   Other Shortness Of Breath    UNSPECIFIED AGENTS Allergic to perfumes and cleaning products   Shellfish Allergy Anaphylaxis    Per allergy test.   Ace Inhibitors Cough and Other (See Comments)        Celecoxib      upset stomach  Pt is taking med    Aspirin Nausea Only    stomach upset     Medications Reviewed Today     Reviewed by [name], RN (Registered Nurse) on 01/07/22 at [id number]  Med List Status: <None>   Medication  Order Taking? Sig Documenting Provider Last Dose Status Informant  albuterol (PROVENTIL) (2.5 MG/3ML) 0.083% nebulizer solution [id number] Yes USE ONE VIAL (2.5 MG TOTAL) BY NEBULIZATION EVERY 6 (SIX) HOURS AS NEEDED FOR WHEEZING. Stacy Pier, MD Taking Active   allopurinol (ZYLOPRIM) 100 MG tablet [id number] Yes TAKE 1 TABLET (100 MG TOTAL) BY MOUTH DAILY.(AM) [name], PA-C Taking Active   atorvastatin (LIPITOR) 40 MG tablet [id number] Yes TAKE 1 TABLET (40 MG TOTAL) BY MOUTH DAILY. (BEDTIME) Stacy Pier, MD Taking Active   Blood Glucose Monitoring Suppl (ACCU-CHEK AVIVA PLUS) w/Device KIT [id number] Yes 1 each by Does not apply route 3 (three) times daily. Stacy Donovan, PA-C Taking Active Self  celecoxib (CELEBREX) 200 MG capsule [id number] Yes Take 200 mg by mouth 2 (two) times daily. [provider] Taking Active   clopidogrel (PLAVIX) 75 MG tablet [id number] Yes TAKE 1 TABLET (75 MG TOTAL) BY MOUTH DAILY. (AM) Stacy Pier, MD Taking Active   colchicine 0.6 MG tablet [id number] Yes 2 tabs PO x 1 today then 1 tab PO daily x 4 days Stacy Pier, MD Taking Active   diclofenac Sodium (VOLTAREN) 1 % GEL [id number] Yes Apply 2 g topically 4 (four) times daily. [name], MD Taking Active   DULoxetine (CYMBALTA) 60 MG capsule [id number] Yes TAKE 1 CAPSULE (60 MG TOTAL) BY MOUTH DAILY (AM) [name], MD Taking Active   EPINEPHrine 0.3 mg/0.3 mL IJ SOAJ injection [id number] Yes 0.3 mg IM x 1 PRN for allergic reaction Stacy Pier, MD Taking Active Self  [name] 325 (  65 Fe) MG tablet [id number] Yes Take 325 mg by mouth daily. [provider] Taking Active   fluticasone (FLONASE) 50 MCG/ACT nasal spray [id number] Yes Place 1 spray into both nostrils daily. Stacy Pier, MD Taking Active Self    Discontinued 04/16/11 0943 Glucagon 1 MG/0.2ML SOSY [id number] Yes 1 mg arrange subcutaneously as needed for severe symptomatic low blood sugar Stacy Pier, MD Taking Active   glucose blood (ACCU-CHEK AVIVA PLUS) test strip [id number] Yes Use as instructed Stacy Pier, MD Taking Active   glucose chewable tablet 12 g [id number]   Stacy Pier, MD  Active   glycopyrrolate (ROBINUL) 2 MG tablet [id number] Yes Take 2 mg by mouth 3 (three) times daily. [provider] Taking Active   hydrOXYzine (ATARAX) 10 MG tablet [id number] Yes TAKE 1 TABLET IN THE MORNING AND NOON AND 2 TABLETS IN THE EVENING AS NEEDED. Stacy Pier, MD Taking Active   Lancets (ACCU-CHEK MULTICLIX) lancets [id number] Yes Use as instructed Stacy Pier, MD Taking Active   Lancets [facility] SOFT [facility]) lancets [id number] Yes Use as instructed Stacy Donovan, PA-C Taking Active Self    Discontinued 04/16/11 0916 (Error) loratadine (CLARITIN) 10 MG tablet [id number] Yes TAKE 1 TABLET (10 MG TOTAL) BY MOUTH DAILY. (AM) Stacy Pier, MD Taking Active   melatonin 5 MG TABS [id number] Yes 1 tablet in the evening [provider] Taking Active   metFORMIN (GLUCOPHAGE-XR) 750 MG 24 hr tablet [id number] Yes TAKE 1 TABLET (750 MG TOTAL) BY MOUTH 2 (TWO) TIMES DAILY (AM+BEDTIME) Stacy Pier, MD Taking Active   methocarbamol (ROBAXIN) 750 MG tablet [id number] Yes SMARTSIG:1 Tablet(s) By Mouth Every 12 Hours PRN [name], MD Taking Active   montelukast (SINGULAIR) 10 MG tablet [id number] Yes Take 1 tablet by mouth daily. [provider] Taking Active   Multiple Vitamins-Minerals (CENTRUM SILVER 50+WOMEN PO) [id number] Yes Take 1 tablet by mouth daily. [provider] Taking Active Self  nicotine (NICODERM CQ - DOSED IN MG/24 HOURS) 14 mg/24hr patch [id number] No 1 patch to skin  Patient not taking: Reported on 10/21/2021   [provider] Not Taking Active   pantoprazole (PROTONIX) 40 MG tablet [id number] Yes Take 40 mg by mouth 2 (two) times daily. [provider] Taking Active   potassium chloride SA  (KLOR-CON M) 20 MEQ tablet [id number] Yes TAKE ONE TABLET BY MOUTH ONCE DAILY (NOON) Stacy Pier, MD Taking Active   Respiratory Therapy Supplies (FLUTTER) DEVI [id number] Yes Use after breathing treatment 4 times daily Elsie Stain, MD Taking Active Self  sucralfate (CARAFATE) 1 g tablet [id number] No Take 1 g by mouth 4 (four) times daily -  with meals and at bedtime.  Patient not taking: Reported on 01/07/2022   [provider] Not Taking Active Self  SYMBICORT 160-4.5 MCG/ACT inhaler [id number] Yes Inhale 2 puffs into the lungs 2 (two) times daily. [provider] Taking Active   tiotropium (SPIRIVA HANDIHALER) 18 MCG inhalation capsule [id number] Yes PLACE 1 CAPSULE (18 MCG TOTAL) INTO INHALER AND INHALE DAILY. Stacy Pier, MD Taking Active   tiZANidine (ZANAFLEX) 2 MG tablet [id number] No Take 2 mg by mouth 3 (three) times daily.  Patient not taking: Reported on 01/07/2022   [provider] Not Taking Active   traMADol (ULTRAM) 50 MG tablet [id number] No   Patient not taking: Reported on 01/07/2022   [provider] Not Taking Active   triamcinolone cream (KENALOG)  0.1 % [id number] Yes Apply 1 application topically 2 (two) times daily. Mayers, Loraine Grip, PA-C Taking Active Self  valsartan-hydrochlorothiazide (DIOVAN-HCT) 160-12.5 MG tablet [id number] Yes TAKE ONE TABLET BY MOUTH ONCE DAILY (AM) Stacy Pier, MD Taking Active             Patient Active Problem List   Diagnosis Date Noted   Mixed stress and urge urinary incontinence 12/04/2021   Functional fecal incontinence 12/04/2021   IBS (irritable bowel syndrome) 11/19/2021   Spondylolisthesis, lumbar region    Other spondylosis with radiculopathy, lumbar region    Other secondary scoliosis, lumbar region    Fusion of spine of lumbar region 03/25/2021   Colon polyps 06/06/2020   Lupus ([place]) 06/06/2020   Allergic rhinitis due to animal (cat) (dog) hair and dander 04/08/2020    Allergic rhinitis due to pollen 04/08/2020   Food allergy 04/08/2020   Acute medial meniscus tear, left, subsequent encounter 03/21/2020   Chronic pain of left knee 02/15/2020   Paresthesia of skin 11/16/2019   History of total knee replacement, right 11/16/2019   Tobacco abuse 11/16/2019   Centrilobular emphysema ([place]) 08/10/2019   Incidental lung nodule, > 106m and < 843m07/09/2019   OSA on CPAP 08/10/2019   Dyspnea on exertion 08/02/2019   Hyperlipidemia 08/02/2019   Lumbar radiculopathy 04/24/2019   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   Post laminectomy syndrome 02/23/2019   Abnormality of gait 02/23/2019   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   Unilateral primary osteoarthritis, left hip 12/28/2018   Lesion of skin of left ear 12/26/2018   Primary osteoarthritis of left hip 12/02/2018   Iron deficiency anemia 10/16/2018   Chronic pain syndrome 09/08/2018   Chronic pain of right knee 08/11/2018   Status post lumbar laminectomy 07/15/2018   Peripheral arterial disease ([place]03/04/2018   Moderate persistent asthma without complication 0570/35/0093 Environmental and seasonal allergies 06/29/2017   Controlled type 2 diabetes mellitus with diabetic polyneuropathy, without long-term current use of insulin ([place]05/28/2019   Perennial allergic rhinitis 04/08/2017   Sensorineural hearing loss (SNHL), bilateral 04/08/2017   Chronic pansinusitis 03/25/2017   Eustachian tube dysfunction, bilateral 0281/82/9937 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 of lumbar intervertebral disc with radiculopathy 10/02/2016    Class: Chronic   Alopecia areata 08/19/2016   Chondromalacia of both patellae 06/03/2015    Class: Chronic   Spinal stenosis, lumbar region, with neurogenic claudication 06/03/2015   Tobacco use disorder 04/25/2015   DJD (degenerative joint disease) of knee 01/04/2015   Hemorrhoid 11/14/2014   Gout of big toe 07/19/2014   Essential hypertension 08/14/2013    Gastroesophageal reflux disease without esophagitis 08/14/2013   COPD (chronic obstructive pulmonary disease) ([place]03/15/2013    Conditions to be addressed/monitored per PCP order:  DMII  Care Plan : RN Care Manager Plan of Care  Updates made by RoMelissa MontaneRN since 01/07/2022 12:00 AM     Problem: Health Management needs related to DMII      Long-Range Goal: Development of Plan of Care to address Health Management needs related to DMII   Start Date: 10/21/2021  Expected End Date: 03/09/2022  Priority: High  Note:   Current Barriers:  Chronic Disease Management support and education needs related to DMII-Patient with right knee and toe pain. Being treated for gout by PCP and Orthopedic for knee pain.   RNCM Clinical Goal(s):  Patient will verbalize understanding of plan for management of DMII as  evidenced by patient reports take all medications exactly as prescribed and will call provider for medication related questions as evidenced by patient reports and EMR documentation    attend all scheduled medical appointments:  01/12/22 with Neurology, 01/14/22 with Orthopedic, 01/19/22 with PCP for TB testing and 03/20/21 with [facility] as evidenced by provider documentation        work with pharmacist to address Medication management related to DMII as evidenced by review of EMR and patient or pharmacist report    work with Education officer, museum to address Limited access to food related to the management of DMII as evidenced by review of EMR and patient or Education officer, museum report     through collaboration with Consulting civil engineer, provider, and care team.   Interventions: Inter-disciplinary care team collaboration (see longitudinal plan of care) Evaluation of current treatment plan related to  self management and patient's adherence to plan as established by provider Advised patient to contact [facility] and/or Social Services to inquire about medication copay waiver-revisited Reviewed recent ED and PCP visit  notes Provided patient with Gout education   Diabetes:  (Status: Goal on Track (progressing): YES.) Long Term Goal   Lab Results  Component Value Date   HGBA1C 6.6 01/05/2022   @ Assessed patient's understanding of A1c goal: <7% Reviewed medications with patient and discussed importance of medication adherence;        Reviewed prescribed diet with patient diabetic diet, advised patient to include protein in meals and snacks, examples given; Provided patient with written educational materials related to hypo and hyperglycemia and importance of correct treatment;       call provider for findings outside established parameters;       Review of patient status, including review of consultants reports, relevant laboratory and other test results, and medications completed;       Provided patient with Urgent Tooth [phone] denture evaluation Verified patient received lancets, test strips and albuterol nebulizer  Patient Goals/Self-Care Activities: Take medications as prescribed   Attend all scheduled provider appointments Call provider office for new concerns or questions  check blood sugar at prescribed times: twice daily take the blood sugar meter to all doctor visits keep a food diary       Follow Up:  Patient agrees to Care Plan and Follow-up.  Plan: The Managed Medicaid care management team will reach out to the patient again over the next 60 days.  Date/time of next scheduled RN care management/care coordination outreach:  03/09/21 @ [place] RN, [place] RN Care Coordinator

## 2022-01-07 NOTE — Patient Instructions (Signed)
Visit Information  Stacy [name] was given information about Medicaid Managed Care team care coordination services as a part of their [facility] Community Plan Medicaid benefit. Stacy [name] verbally consented to engagement with the [facility] Managed Care team.   If you are experiencing a medical emergency, please call 911 or report to your local emergency department or urgent care.   If you have a non-emergency medical problem during routine business hours, please contact your provider's office and ask to speak with a nurse.   For questions related to your [facility], please call: [phone] or visit the homepage here: [email or web address]  If you would like to schedule transportation through your [facility], please call the following number at least 2 days in advance of your appointment: [phone]   Rides for urgent appointments can also be made after hours by calling Member Services.  Call the Behavioral Health Crisis Line at ([phone], at any time, 24 hours a day, 7 days a week. If you are in danger or need immediate medical attention call 911.  If you would like help to quit smoking, call 1-800-QUIT-NOW (307 763 7814) OR Espaol: 1-855-Djelo-Ya (7-342-876-8115) o para ms informacin haga clic aqu or Text READY to 726-203 to register via text  [name],   Please see education materials related to gout provided by MyChart link.  Patient verbalizes understanding of instructions and care plan provided today and agrees to view in MyChart. Active MyChart status and patient understanding of how to access instructions and care plan via MyChart confirmed with patient.     Telephone follow up appointment with Managed Medicaid care management team member scheduled for:03/09/22 @ 9am  Stacy Emms RN, BSN [place]  [facility] RN Care  Coordinator   Following is a copy of your plan of care:  Care Plan : RN Care Manager Plan of Care  Updates made by [name], RN since 01/07/2022 12:00 AM     Problem: Health Management needs related to DMII      Long-Range Goal: Development of Plan of Care to address Health Management needs related to DMII   Start Date: 10/21/2021  Expected End Date: 03/09/2022  Priority: High  Note:   Current Barriers:  Chronic Disease Management support and education needs related to DMII-Patient with right knee and toe pain. Being treated for gout by PCP and Orthopedic for knee pain.   RNCM Clinical Goal(s):  Patient will verbalize understanding of plan for management of DMII as evidenced by patient reports take all medications exactly as prescribed and will call provider for medication related questions as evidenced by patient reports and EMR documentation    attend all scheduled medical appointments:  01/12/22 with Neurology, 01/14/22 with Orthopedic, 01/19/22 with PCP for TB testing and 03/20/21 with [facility] as evidenced by provider documentation        work with pharmacist to address Medication management related to DMII as evidenced by review of EMR and patient or pharmacist report    work with Child psychotherapist to address Limited access to food related to the management of DMII as evidenced by review of EMR and patient or Child psychotherapist report     through collaboration with Medical illustrator, provider, and care team.   Interventions: Inter-disciplinary care team collaboration (see longitudinal plan of care) Evaluation of current treatment plan related to  self management and patient's adherence to plan as established by provider Advised patient to contact [facility] and/or Social  Services to inquire about medication copay waiver-revisited Reviewed recent ED and PCP visit notes Provided patient with Gout education   Diabetes:  (Status: Goal on Track (progressing): YES.) Long Term Goal   Lab Results   Component Value Date   HGBA1C 6.6 01/05/2022   @ Assessed patient's understanding of A1c goal: <7% Reviewed medications with patient and discussed importance of medication adherence;        Reviewed prescribed diet with patient diabetic diet, advised patient to include protein in meals and snacks, examples given; Provided patient with written educational materials related to hypo and hyperglycemia and importance of correct treatment;       call provider for findings outside established parameters;       Review of patient status, including review of consultants reports, relevant laboratory and other test results, and medications completed;       Provided patient with Urgent Tooth [phone] denture evaluation Verified patient received lancets, test strips and albuterol nebulizer  Patient Goals/Self-Care Activities: Take medications as prescribed   Attend all scheduled provider appointments Call provider office for new concerns or questions  check blood sugar at prescribed times: twice daily take the blood sugar meter to all doctor visits keep a food diary

## 2022-01-09 LAB — PROTEIN, BODY FLUID (OTHER)

## 2022-01-12 ENCOUNTER — Encounter: Payer: Self-pay | Admitting: Neurology

## 2022-01-12 ENCOUNTER — Ambulatory Visit: Payer: Medicaid Other | Admitting: Orthopedic Surgery

## 2022-01-12 ENCOUNTER — Other Ambulatory Visit: Payer: Medicaid Other

## 2022-01-12 ENCOUNTER — Ambulatory Visit: Payer: Medicaid Other | Admitting: Neurology

## 2022-01-12 VITALS — BP 131/85 | HR 100 | Ht 66.0 in | Wt 154.0 lb

## 2022-01-12 DIAGNOSIS — R202 Paresthesia of skin: Secondary | ICD-10-CM

## 2022-01-12 DIAGNOSIS — G5601 Carpal tunnel syndrome, right upper limb: Secondary | ICD-10-CM

## 2022-01-12 NOTE — Progress Notes (Signed)
Follow-up Visit   Date: 01/12/22   Stacy [name] MRN: [id number] DOB: 17-Jul-1960   Interim History: Stacy [name] is a 61 y.o. left-handed female with diabetes mellitus complicated by neuropathy, hypertension, chronic pain, s/p lumbar surgery x 2, depression, and tobacco use returning to the clinic for follow-up of bilateral hand pain and paresthesisa.  The patient was accompanied to the clinic by self.  History of present illness: Starting around the end of 2021, she began having numbness and tingling of all the fingers, which is worse on the left.  She wakes up at night about 2-3 times per week with her falling asleep.  She has some weakness in the hands.  She has been using wrist braces which provides some relief.  No neck or radicular pain.  She has numbness in the feet from diabetic neuropathy.   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  She lives at home with her husband.    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 She continues to have significant pain and shooting sensation involving both hands.  She wears bilateral wrist braces which helps some. She also complains of neck discomfort and left shoulder pain. She is seeing Dr. [name] for lumbar spondylosis and will be having lumbar fusion at L3-4 and L4-5.  She has no benefit with PT.  UPDATE 01/12/2022:  She is here for follow-up visit because of worsening right hand paresthesias.  She has been dropping objects. She had NCS/EMG in August 2022 which shows mild right CTS.  She has been compliant using a brace, but it has not helped.  Recently, her overall health has been poor.  She is having a lot of right knee pain (arthritis) and right foot pain (gout).   Medications:  Current Outpatient Medications on File Prior to Visit  Medication Sig Dispense Refill   albuterol (PROVENTIL) (2.5 MG/3ML)  0.083% nebulizer solution USE ONE VIAL (2.5 MG TOTAL) BY NEBULIZATION EVERY 6 (SIX) HOURS AS NEEDED FOR WHEEZING. 360 mL 2   allopurinol (ZYLOPRIM) 100 MG tablet TAKE 1 TABLET (100 MG TOTAL) BY MOUTH DAILY.(AM) 90 tablet 3   atorvastatin (LIPITOR) 40 MG tablet TAKE 1 TABLET (40 MG TOTAL) BY MOUTH DAILY. (BEDTIME) 90 tablet 1   Blood Glucose Monitoring Suppl (ACCU-CHEK AVIVA PLUS) w/Device KIT 1 each by Does not apply route 3 (three) times daily. 1 kit 0   celecoxib (CELEBREX) 200 MG capsule Take 200 mg by mouth 2 (two) times daily.     clopidogrel (PLAVIX) 75 MG tablet TAKE 1 TABLET (75 MG TOTAL) BY MOUTH DAILY. (AM) 30 tablet 5   diclofenac Sodium (VOLTAREN) 1 % GEL Apply 2 g topically 4 (four) times daily. 350 g 5   DULoxetine (CYMBALTA) 60 MG capsule TAKE 1 CAPSULE (60 MG TOTAL) BY MOUTH DAILY (AM) 30 capsule 6   EPINEPHrine 0.3 mg/0.3 mL IJ SOAJ injection 0.3 mg IM x 1 PRN for allergic reaction 1 Device 1   [name] 325 (65 Fe) MG tablet Take 325 mg by mouth daily.     fluticasone (FLONASE) 50 MCG/ACT nasal spray Place 1 spray into both nostrils daily. 16 g 6   Glucagon 1 MG/0.2ML SOSY 1 mg arrange subcutaneously as needed for severe symptomatic low blood sugar 0.2 mL 0   glucose blood (ACCU-CHEK AVIVA PLUS) test strip Use as instructed 100 each 12  glycopyrrolate (ROBINUL) 2 MG tablet Take 2 mg by mouth 3 (three) times daily.     hydrOXYzine (ATARAX) 10 MG tablet TAKE 1 TABLET IN THE MORNING AND NOON AND 2 TABLETS IN THE EVENING AS NEEDED. 90 tablet 0   Lancets (ACCU-CHEK MULTICLIX) lancets Use as instructed 100 each 12   Lancets (ACCU-CHEK SOFT TOUCH) lancets Use as instructed 100 each 12   loratadine (CLARITIN) 10 MG tablet TAKE 1 TABLET (10 MG TOTAL) BY MOUTH DAILY. (AM) 90 tablet 1   melatonin 5 MG TABS 1 tablet in the evening     metFORMIN (GLUCOPHAGE-XR) 750 MG 24 hr tablet TAKE 1 TABLET (750 MG TOTAL) BY MOUTH 2 (TWO) TIMES DAILY (AM+BEDTIME) 180 tablet 1   methocarbamol (ROBAXIN) 750  MG tablet SMARTSIG:1 Tablet(s) By Mouth Every 12 Hours PRN 30 tablet 1   montelukast (SINGULAIR) 10 MG tablet Take 1 tablet by mouth daily.     Multiple Vitamins-Minerals (CENTRUM SILVER 50+WOMEN PO) Take 1 tablet by mouth daily.     pantoprazole (PROTONIX) 40 MG tablet Take 40 mg by mouth 2 (two) times daily.     potassium chloride SA (KLOR-CON M) 20 MEQ tablet TAKE ONE TABLET BY MOUTH ONCE DAILY (NOON) 90 tablet 1   Respiratory Therapy Supplies (FLUTTER) DEVI Use after breathing treatment 4 times daily 1 each 0   SYMBICORT 160-4.5 MCG/ACT inhaler Inhale 2 puffs into the lungs 2 (two) times daily.     tiotropium (SPIRIVA HANDIHALER) 18 MCG inhalation capsule PLACE 1 CAPSULE (18 MCG TOTAL) INTO INHALER AND INHALE DAILY. 30 capsule 1   tiZANidine (ZANAFLEX) 2 MG tablet Take 2 mg by mouth 3 (three) times daily.     triamcinolone cream (KENALOG) 0.1 % Apply 1 application topically 2 (two) times daily. 30 g 0   valsartan-hydrochlorothiazide (DIOVAN-HCT) 160-12.5 MG tablet TAKE ONE TABLET BY MOUTH ONCE DAILY (AM) 90 tablet 1   colchicine 0.6 MG tablet 2 tabs PO x 1 today then 1 tab PO daily x 4 days (Patient not taking: Reported on 01/12/2022) 6 tablet 0   nicotine (NICODERM CQ - DOSED IN MG/24 HOURS) 14 mg/24hr patch 1 patch to skin (Patient not taking: Reported on 10/21/2021)     sucralfate (CARAFATE) 1 g tablet Take 1 g by mouth 4 (four) times daily -  with meals and at bedtime. (Patient not taking: Reported on 01/07/2022)     traMADol (ULTRAM) 50 MG tablet  (Patient not taking: Reported on 01/07/2022)     [DISCONTINUED] Fluticasone-Salmeterol (ADVAIR) 500-50 MCG/DOSE AEPB Inhale 1 puff into the lungs every 12 (twelve) hours.       [DISCONTINUED] lisinopril (PRINIVIL,ZESTRIL) 40 MG tablet Take 40 mg by mouth daily.       Current Facility-Administered Medications on File Prior to Visit  Medication Dose Route Frequency Provider Last Rate Last Admin   glucose chewable tablet 12 g  3 tablet Oral Once  [name], MD        Allergies:  Allergies  Allergen Reactions   Other Shortness Of Breath    UNSPECIFIED AGENTS Allergic to perfumes and cleaning products   Shellfish Allergy Anaphylaxis    Per allergy test.   Ace Inhibitors Cough and Other (See Comments)        Celecoxib      upset stomach  Pt is taking med    Aspirin Nausea Only    stomach upset     Vital Signs:  BP 131/85   Pulse 100  Ht _0  (1.676 m)   Wt 154 lb (69.9 kg)   LMP 09/01/2010   SpO2 98%   BMI 24.86 kg/m   Neurological Exam: MENTAL STATUS including orientation to time, place, person, recent and remote memory, attention span and concentration, language, and fund of knowledge is normal.  Speech is not dysarthric.  CRANIAL NERVES:   Pupils equal round and reactive to light.  Normal conjugate, extra-ocular eye movements in all directions of gaze.  No ptosis.  Face is symmetric.  MOTOR:  Motor strength is 5/5 in all extremities.  No atrophy, fasciculations or abnormal movements.  No pronator drift.  Tone is normal.    MSRs:  Reflexes are 2+/4 throughout  SENSORY:  Vibration is intact throughout  COORDINATION/GAIT:  Gait is slow, antalgic assisted with crutch, right knee extended with support of foam knee brace.    Data: NCS/EMG of the upper extremities 09/17/2020: Right median neuropathy at or distal to the wrist (mild), consistent with a clinical diagnosis of carpal tunnel syndrome.    MRI cervical spine wo contrast 01/12/2021: Mild degenerative changes without significant stenosis.   IMPRESSION/PLAN: Bilateral hand paresthesias and pain, worse on the right hand. Prior NCS/EMG from August 2022 shows mild right CTS. MRI cervical spine without foraminal or canal stenosis.   Repeat NCS/EMG of bilateral hands to evaluate for interval change.  She may also benefit from steroid injection with orthopeadics.  She is scheduled to see them for a separate issue related to her knee this week and I  encouraged her to talk to them about right carpal tunnel syndrome.     Thank you for allowing me to participate in patient's care.  If I can answer any additional questions, I would be pleased to do so.    Sincerely,    Kately Graffam K. Posey Pronto, DO

## 2022-01-12 NOTE — Patient Instructions (Signed)
Visit Information  Stacy [name] was given information about Medicaid Managed Care team care coordination services as a part of their [facility] Community Plan Medicaid benefit. [name] Pedro verbally consented to engagement with the [facility] Managed Care team.   If you are experiencing a medical emergency, please call 911 or report to your local emergency department or urgent care.   If you have a non-emergency medical problem during routine business hours, please contact your provider's office and ask to speak with a nurse.   For questions related to your [facility], please call: [phone] or visit the homepage here: [email or web address]  If you would like to schedule transportation through your [facility], please call the following number at least 2 days in advance of your appointment: [phone]   Rides for urgent appointments can also be made after hours by calling Member Services.  Call the Behavioral Health Crisis Line at [phone], at any time, 24 hours a day, 7 days a week. If you are in danger or need immediate medical attention call 911.  If you would like help to quit smoking, call 1-800-QUIT-NOW ((302) 138-2370) OR Espaol: 1-855-Djelo-Ya (1-601-093-2355) o para ms informacin haga clic aqu or Text READY to 732-202 to register via text  Ms. Frentz - following are the goals we discussed in your visit today:   Goals Addressed   None       Social Worker will follow up 02/12/22.   [name], BSW, [facility]  [place]  High Risk Managed Medicaid Team  [phone]   Following is a copy of your plan of care:  There are no care plans that you recently modified to display for this patient.

## 2022-01-12 NOTE — Patient Outreach (Signed)
Medicaid Managed Care Social Work Note  01/12/2022 Name:  [name] MRN:  [id number] DOB:  February 10, 1960  Stacy [name] is an 61 y.o. year old female who is a primary patient of [name], MD.  The Medicaid Managed Care Coordination team was consulted for assistance with:  Community Resources   Stacy [name] was given information about Medicaid Managed Care Coordination team services today. Stacy [name] Patient agreed to services and verbal consent obtained.  Engaged with patient  for by telephone forfollow up visit in response to referral for case management and/or care coordination services.   Assessments/Interventions:  Review of past medical history, allergies, medications, health status, including review of consultants reports, laboratory and other test data, was performed as part of comprehensive evaluation and provision of chronic care management services.  SDOH: (Social Determinant of Health) assessments and interventions performed: SDOH Interventions    Flowsheet Row Patient Outreach Telephone from 01/07/2022 in [place] Patient Outreach Telephone from 10/21/2021 in [place] Coordination  SDOH Interventions    Housing Interventions -- Intervention Not Indicated  Transportation Interventions -- Intervention Not Indicated  Alcohol Usage Interventions Intervention Not Indicated (Score <7) --     BSW completed a telephone outreach with patient. Patient states she did receive her depends but they were too big, she did contact [facility] to let them know and another script had to be sent in. Patient states she still has not received the new depends yet. BSW will send PCP a message to check on the status of the script.  Advanced Directives Status:  Not addressed in this encounter.  Care Plan                 Allergies  Allergen Reactions   Other Shortness Of Breath    UNSPECIFIED  AGENTS Allergic to perfumes and cleaning products   Shellfish Allergy Anaphylaxis    Per allergy test.   Ace Inhibitors Cough and Other (See Comments)        Celecoxib      upset stomach  Pt is taking med    Aspirin Nausea Only    stomach upset     Medications Reviewed Today     Reviewed by Renae Gloss, LPN (Licensed Practical Nurse) on 01/12/22 at [id number]  Med List Status: <None>   Medication Order Taking? Sig Documenting Provider Last Dose Status Informant  albuterol (PROVENTIL) (2.5 MG/3ML) 0.083% nebulizer solution [id number] Yes USE ONE VIAL (2.5 MG TOTAL) BY NEBULIZATION EVERY 6 (SIX) HOURS AS NEEDED FOR WHEEZING. [name], MD Taking Active   allopurinol (ZYLOPRIM) 100 MG tablet [id number] Yes TAKE 1 TABLET (100 MG TOTAL) BY MOUTH DAILY.(AM) [name], PA-C Taking Active   atorvastatin (LIPITOR) 40 MG tablet [id number] Yes TAKE 1 TABLET (40 MG TOTAL) BY MOUTH DAILY. (BEDTIME) [name], MD Taking Active   Blood Glucose Monitoring Suppl (ACCU-CHEK AVIVA PLUS) w/Device KIT [id number] Yes 1 each by Does not apply route 3 (three) times daily. Argentina Donovan, PA-C Taking Active Self  celecoxib (CELEBREX) 200 MG capsule [id number] Yes Take 200 mg by mouth 2 (two) times daily. [provider] Taking Active   clopidogrel (PLAVIX) 75 MG tablet [id number] Yes TAKE 1 TABLET (75 MG TOTAL) BY MOUTH DAILY. (AM) [name], MD Taking Active   colchicine 0.6 MG tablet [id number] No 2 tabs PO x 1 today then 1 tab PO daily x 4 days  Patient not  taking: Reported on 01/12/2022   [name], MD Not Taking Active   diclofenac Sodium (VOLTAREN) 1 % GEL [id number] Yes Apply 2 g topically 4 (four) times daily. [name], MD Taking Active   DULoxetine (CYMBALTA) 60 MG capsule [id number] Yes TAKE 1 CAPSULE (60 MG TOTAL) BY MOUTH DAILY (AM) [name], MD Taking Active   EPINEPHrine 0.3 mg/0.3 mL IJ SOAJ injection [id number] Yes 0.3 mg IM x 1 PRN for  allergic reaction [name], MD Taking Active Self  [name] 325 (65 Fe) MG tablet [id number] Yes Take 325 mg by mouth daily. [provider] Taking Active   fluticasone (FLONASE) 50 MCG/ACT nasal spray [id number] Yes Place 1 spray into both nostrils daily. [name], MD Taking Active Self    Discontinued 04/16/11 0943 Glucagon 1 MG/0.2ML SOSY [id number] Yes 1 mg arrange subcutaneously as needed for severe symptomatic low blood sugar [name], MD Taking Active   glucose blood (ACCU-CHEK AVIVA PLUS) test strip [id number] Yes Use as instructed [name], MD Taking Active   glucose chewable tablet 12 g [id number]   [name], MD  Active   glycopyrrolate (ROBINUL) 2 MG tablet [id number] Yes Take 2 mg by mouth 3 (three) times daily. [provider] Taking Active   hydrOXYzine (ATARAX) 10 MG tablet [id number] Yes TAKE 1 TABLET IN THE MORNING AND NOON AND 2 TABLETS IN THE EVENING AS NEEDED. [name], MD Taking Active   Lancets (ACCU-CHEK MULTICLIX) lancets [id number] Yes Use as instructed [name], MD Taking Active   Lancets [facility] SOFT [facility]) lancets [id number] Yes Use as instructed Argentina Donovan, PA-C Taking Active Self    Discontinued 04/16/11 0916 (Error) loratadine (CLARITIN) 10 MG tablet [id number] Yes TAKE 1 TABLET (10 MG TOTAL) BY MOUTH DAILY. (AM) [name], MD Taking Active   melatonin 5 MG TABS [id number] Yes 1 tablet in the evening [provider] Taking Active   metFORMIN (GLUCOPHAGE-XR) 750 MG 24 hr tablet [id number] Yes TAKE 1 TABLET (750 MG TOTAL) BY MOUTH 2 (TWO) TIMES DAILY (AM+BEDTIME) [name], MD Taking Active   methocarbamol (ROBAXIN) 750 MG tablet [id number] Yes SMARTSIG:1 Tablet(s) By Mouth Every 12 Hours PRN [name], MD Taking Active   montelukast (SINGULAIR) 10 MG tablet [id number] Yes Take 1 tablet by mouth daily. [provider] Taking Active   Multiple  Vitamins-Minerals (CENTRUM SILVER 50+WOMEN PO) [id number] Yes Take 1 tablet by mouth daily. [provider] Taking Active Self  nicotine (NICODERM CQ - DOSED IN MG/24 HOURS) 14 mg/24hr patch [id number] No 1 patch to skin  Patient not taking: Reported on 10/21/2021   [provider] Not Taking Active   pantoprazole (PROTONIX) 40 MG tablet [id number] Yes Take 40 mg by mouth 2 (two) times daily. [provider] Taking Active   potassium chloride SA (KLOR-CON M) 20 MEQ tablet [id number] Yes TAKE ONE TABLET BY MOUTH ONCE DAILY (NOON) [name], MD Taking Active   Respiratory Therapy Supplies (FLUTTER) DEVI [id number] Yes Use after breathing treatment 4 times daily Elsie Stain, MD Taking Active Self  sucralfate (CARAFATE) 1 g tablet [id number] No Take 1 g by mouth 4 (four) times daily -  with meals and at bedtime.  Patient not taking: Reported on 01/07/2022   [provider] Not Taking Active Self  SYMBICORT 160-4.5 MCG/ACT inhaler [id number] Yes Inhale 2 puffs into the lungs 2 (two) times daily. [provider] Taking  Active   tiotropium (SPIRIVA HANDIHALER) 18 MCG inhalation capsule [id number] Yes PLACE 1 CAPSULE (18 MCG TOTAL) INTO INHALER AND INHALE DAILY. [name], MD Taking Active   tiZANidine (ZANAFLEX) 2 MG tablet [id number] Yes Take 2 mg by mouth 3 (three) times daily. [provider] Taking Active   traMADol (ULTRAM) 50 MG tablet [id number] No   Patient not taking: Reported on 01/07/2022   [provider] Not Taking Active   triamcinolone cream (KENALOG) 0.1 % [id number] Yes Apply 1 application topically 2 (two) times daily. Mayers, Loraine Grip, PA-C Taking Active Self  valsartan-hydrochlorothiazide (DIOVAN-HCT) 160-12.5 MG tablet [id number] Yes TAKE ONE TABLET BY MOUTH ONCE DAILY (AM) [name], MD Taking Active             Patient Active Problem List   Diagnosis Date Noted   Mixed stress and urge urinary  incontinence 12/04/2021   Functional fecal incontinence 12/04/2021   IBS (irritable bowel syndrome) 11/19/2021   Spondylolisthesis, lumbar region    Other spondylosis with radiculopathy, lumbar region    Other secondary scoliosis, lumbar region    Fusion of spine of lumbar region 03/25/2021   Colon polyps 06/06/2020   Lupus ([place]) 06/06/2020   Allergic rhinitis due to animal (cat) (dog) hair and dander 04/08/2020   Allergic rhinitis due to pollen 04/08/2020   Food allergy 04/08/2020   Acute medial meniscus tear, left, subsequent encounter 03/21/2020   Chronic pain of left knee 02/15/2020   Paresthesia of skin 11/16/2019   History of total knee replacement, right 11/16/2019   Tobacco abuse 11/16/2019   Centrilobular emphysema ([place]) 08/10/2019   Incidental lung nodule, > 27m and < 831m07/09/2019   OSA on CPAP 08/10/2019   Dyspnea on exertion 08/02/2019   Hyperlipidemia 08/02/2019   Lumbar radiculopathy 04/24/2019   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   Post laminectomy syndrome 02/23/2019   Abnormality of gait 02/23/2019   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   Unilateral primary osteoarthritis, left hip 12/28/2018   Lesion of skin of left ear 12/26/2018   Primary osteoarthritis of left hip 12/02/2018   Iron deficiency anemia 10/16/2018   Chronic pain syndrome 09/08/2018   Chronic pain of right knee 08/11/2018   Status post lumbar laminectomy 07/15/2018   Peripheral arterial disease ([place]03/04/2018   Moderate persistent asthma without complication 0560/73/7106 Environmental and seasonal allergies 06/29/2017   Controlled type 2 diabetes mellitus with diabetic polyneuropathy, without long-term current use of insulin ([place]05/28/2019   Perennial allergic rhinitis 04/08/2017   Sensorineural hearing loss (SNHL), bilateral 04/08/2017   Chronic pansinusitis 03/25/2017   Eustachian tube dysfunction, bilateral 0226/94/8546 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/5007 Herniation of lumbar  intervertebral disc with radiculopathy 10/02/2016    Class: Chronic   Alopecia areata 08/19/2016   Chondromalacia of both patellae 06/03/2015    Class: Chronic   Spinal stenosis, lumbar region, with neurogenic claudication 06/03/2015   Tobacco use disorder 04/25/2015   DJD (degenerative joint disease) of knee 01/04/2015   Hemorrhoid 11/14/2014   Gout of big toe 07/19/2014   Essential hypertension 08/14/2013   Gastroesophageal reflux disease without esophagitis 08/14/2013   COPD (chronic obstructive pulmonary disease) ([facility]03/15/2013    Conditions to be addressed/monitored per PCP order:   community resources  There are no care plans that you recently modified to display for this patient.   Follow up:  Patient agrees to Care Plan and Follow-up.  Plan: The Managed Medicaid care  management team will reach out to the patient again over the next 30 days.  Date/time of next scheduled Social Work care management/care coordination outreach:  02/12/22  Mickel Fuchs, Arita Miss, [place] Medicaid Team  [phone]

## 2022-01-12 NOTE — Patient Instructions (Signed)
EMG of both hands  ELECTROMYOGRAM AND NERVE CONDUCTION STUDIES (EMG/NCS) INSTRUCTIONS  How to Prepare The neurologist conducting the EMG will need to know if you have certain medical conditions. Tell the neurologist and other EMG lab personnel if you: Have a pacemaker or any other electrical medical device Take blood-thinning medications Have hemophilia, a blood-clotting disorder that causes prolonged bleeding Bathing Take a shower or bath shortly before your exam in order to remove oils from your skin. Don't apply lotions or creams before the exam.  What to Expect You'll likely be asked to change into a hospital gown for the procedure and lie down on an examination table. The following explanations can help you understand what will happen during the exam.  Electrodes. The neurologist or a technician places surface electrodes at various locations on your skin depending on where you're experiencing symptoms. Or the neurologist may insert needle electrodes at different sites depending on your symptoms.  Sensations. The electrodes will at times transmit a tiny electrical current that you may feel as a twinge or spasm. The needle electrode may cause discomfort or pain that usually ends shortly after the needle is removed. If you are concerned about discomfort or pain, you may want to talk to the neurologist about taking a short break during the exam.  Instructions. During the needle EMG, the neurologist will assess whether there is any spontaneous electrical activity when the muscle is at rest - activity that isn't present in healthy muscle tissue - and the degree of activity when you slightly contract the muscle.  He or she will give you instructions on resting and contracting a muscle at appropriate times. Depending on what muscles and nerves the neurologist is examining, he or she may ask you to change positions during the exam.  After your EMG You may experience some temporary, minor bruising  where the needle electrode was inserted into your muscle. This bruising should fade within several days. If it persists, contact your primary care doctor.

## 2022-01-13 ENCOUNTER — Other Ambulatory Visit: Payer: Self-pay | Admitting: Internal Medicine

## 2022-01-13 DIAGNOSIS — M79671 Pain in right foot: Secondary | ICD-10-CM

## 2022-01-13 NOTE — Telephone Encounter (Signed)
Requested medication (s) are due for refill today:   Requested medication (s) are on the active medication list: Yes  Last refill:  01/05/22  Future visit scheduled: Yes  Notes to clinic:  See request.    Requested Prescriptions  Pending Prescriptions Disp Refills   colchicine 0.6 MG tablet 6 tablet 0    Sig: 2 tabs PO x 1 today then 1 tab PO daily x 4 days     Endocrinology:  Gout Agents - colchicine Passed - 01/13/2022 11:39 AM      Passed - Cr in normal range and within 360 days    Creat  Date Value Ref Range Status  03/11/2016 0.90 0.50 - 1.05 mg/dL Final    Comment:      For patients > or = 62 years of age: The upper reference limit for Creatinine is approximately 13% higher for people identified as African-American.      Creatinine, Ser  Date Value Ref Range Status  01/05/2022 0.86 0.57 - 1.00 mg/dL Final   Creatinine, POC  Date Value Ref Range Status  06/10/2016 200 mg/dL Final         Passed - ALT in normal range and within 360 days    ALT  Date Value Ref Range Status  01/05/2022 11 0 - 32 IU/L Final         Passed - AST in normal range and within 360 days    AST  Date Value Ref Range Status  01/05/2022 17 0 - 40 IU/L Final         Passed - Valid encounter within last 12 months    Recent Outpatient Visits           1 week ago Type 2 diabetes mellitus with peripheral neuropathy (HCC)   [facility] Marcine Matar, MD   1 month ago Administrative encounter   [facility] Jonah Blue B, MD   4 months ago Type 2 diabetes mellitus with hypoglycemia without coma, without long-term current use of insulin [facility])   [facility] Marcine Matar, MD   8 months ago Hospital discharge follow-up   [facility] Marcine Matar, MD   9 months ago Generalized pruritus   [facility] [place], Shea Stakes, NP        Future Appointments             Tomorrow London Sheer, MD [facility]   In 3 months Marcine Matar, MD [facility]            Passed - CBC within normal limits and completed in the last 12 months    WBC  Date Value Ref Range Status  01/01/2022 8.2 3.8 - 10.8 Thousand/uL Final   RBC  Date Value Ref Range Status  01/01/2022 4.39 3.80 - 5.10 Million/uL Final   Hemoglobin  Date Value Ref Range Status  01/01/2022 12.1 11.7 - 15.5 g/dL Final  95/18/8416 60.6 11.1 - 15.9 g/dL Final   HCT  Date Value Ref Range Status  01/01/2022 36.9 35.0 - 45.0 % Final   Hematocrit  Date Value Ref Range Status  05/01/2021 34.7 34.0 - 46.6 % Final   MCHC  Date Value Ref Range Status  01/01/2022 32.8 32.0 - 36.0 g/dL Final   [facility]  Date Value Ref Range Status  01/01/2022 27.6  27.0 - 33.0 pg Final   MCV  Date Value Ref Range Status  01/01/2022 84.1 80.0 - 100.0 fL Final  05/01/2021 81 79 - 97 fL Final   No results found for: "PLTCOUNTKUC", "LABPLAT", "POCPLA" RDW  Date Value Ref Range Status  01/01/2022 14.1 11.0 - 15.0 % Final  05/01/2021 15.7 (H) 11.7 - 15.4 % Final

## 2022-01-13 NOTE — Telephone Encounter (Signed)
Pt is calling back to follow up on her briefs. Pt stated she needs a size medium, and a large was sent. Pt mentioned she reached out to [employer], and they don't have an order from PCP for size medium briefs..  Please advise.

## 2022-01-13 NOTE — Telephone Encounter (Signed)
Medication Refill - Medication: colchicine 0.6 MG tablet   Has the patient contacted their pharmacy? No.   Preferred Pharmacy (with phone number or street name):  [facility] - [place], [place] - [address] Phone: [phone]  Fax: [phone]     Has the patient been seen for an appointment in the last year OR does the patient have an upcoming appointment? Yes.    The patient reports she is still having problems with gout in her foot and that is why she needs a refill. Please assist patient further

## 2022-01-13 NOTE — Telephone Encounter (Signed)
[employer] order has been received and will be faxed once PCP signs off.

## 2022-01-14 ENCOUNTER — Ambulatory Visit: Payer: Medicaid Other | Admitting: Orthopedic Surgery

## 2022-01-14 DIAGNOSIS — F331 Major depressive disorder, recurrent, moderate: Secondary | ICD-10-CM | POA: Diagnosis not present

## 2022-01-14 DIAGNOSIS — Z96659 Presence of unspecified artificial knee joint: Secondary | ICD-10-CM

## 2022-01-14 DIAGNOSIS — Z96651 Presence of right artificial knee joint: Secondary | ICD-10-CM

## 2022-01-14 DIAGNOSIS — T84038A Mechanical loosening of other internal prosthetic joint, initial encounter: Secondary | ICD-10-CM

## 2022-01-14 MED ORDER — COLCHICINE 0.6 MG PO TABS: ORAL_TABLET | ORAL | 0 refills | Status: DC

## 2022-01-14 NOTE — Telephone Encounter (Signed)
Called LVM for patient to call back

## 2022-01-14 NOTE — Progress Notes (Signed)
Orthopedic Spine Surgery Office Note   Assessment: Patient is a 61 y.o. female s/p right TKA Stacy [name] - 2017), now 1.5 month of knee pain and swelling. Inflammatory markers wnl. Aspiration from ER showed no organisms.     Plan: -Ordered bone scan to evaluate for aseptic loosening -WBAT RLE, continue range of motion, do not need immobilizer -Will refer her to Dr. Magnus Ivan if bone scan shows findings consistent with aseptic loosening -Patient should return to office in 2 weeks, repeat x-rays at next visit: none     Patient expressed understanding of the plan and all questions were answered to the patient's satisfaction.    ___________________________________________________________________________     History:   Patient is a 61 y.o. female who presents today for right knee pain. Patient had a right TKA with Dr. Otelia [name] in 2017. She had been doing well after that surgery and was ambulating without issue.  She developed pain in the knee in the last month and a half.  There is no trauma or injury that brought the pain.  She did notice some swelling around the knee.  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  She comes back today to follow-up on the results of some of those labs and the aspiration ER performed.  She is still having pain in the knee.  The swelling has improved.  Pain is about the same as last time I saw her.  Denies paresthesias and numbness.   Treatments tried: activity modification, knee immobilizer, NSAIDs, tylenol   Physical Exam:   General: no acute distress, appears stated age Neurologic: alert, answering questions appropriately, following commands Respiratory: unlabored breathing on room air, symmetric chest rise Psychiatric: appropriate affect, normal cadence to speech     MSK (RLE): -No swelling on right knee when compared to contralateral side -TTP over the medial joint line -Able to actively range  knee from 5-95, passively can get 0-100 but has pain past her active range of motion -Able to extend knee against gravity -EHL/TA/GSC intact -SILT in s/s/dp/sp/t nerve distributions -Foot warm and well perfused -No sinus track     Imaging: XR of the right knee from 12/31/2021 was previously independently reviewed and interpreted, showing no fracture or dislocation. Lucency around the tibia component baseplate. No lucency around the femoral component.      Patient name: Stacy [name] Patient MRN: [id number] Date of visit: 01/14/22

## 2022-01-16 NOTE — Telephone Encounter (Signed)
Called & spoke to the patient. Verified name & DOB. Informed that the [employer] order is ready and has been faxed on 01/16/2022. Patient expressed understanding.

## 2022-01-19 ENCOUNTER — Ambulatory Visit: Payer: Medicaid Other | Attending: Internal Medicine

## 2022-01-19 ENCOUNTER — Telehealth: Payer: Self-pay | Admitting: Emergency Medicine

## 2022-01-19 DIAGNOSIS — Z23 Encounter for immunization: Secondary | ICD-10-CM

## 2022-01-19 NOTE — Telephone Encounter (Signed)
Copied from CRM [id number]. Topic: Appointment Scheduling - Scheduling Inquiry for Clinic >> Jan 19, 2022 10:31 AM Franchot Heidelberg wrote: Reason for CRM: Pt called regarding her CT scan of her left breast nodule recheck that is due in January. Pt wants a call back to discuss scheduling

## 2022-01-19 NOTE — Progress Notes (Signed)
Tdap vaccine administered per protocols.  Information sheet given. Patient denies and pain or discomfort at injection site. Tolerated injection well no reaction.

## 2022-01-20 DIAGNOSIS — R32 Unspecified urinary incontinence: Secondary | ICD-10-CM | POA: Diagnosis not present

## 2022-01-20 DIAGNOSIS — I10 Essential (primary) hypertension: Secondary | ICD-10-CM | POA: Diagnosis not present

## 2022-01-20 NOTE — Telephone Encounter (Signed)
Called & spoke to the patient. Verified name & DOB. Gave the phone number for the mammogram center for Pam to reach out and r/s. Patient expressed understanding.

## 2022-01-21 DIAGNOSIS — F331 Major depressive disorder, recurrent, moderate: Secondary | ICD-10-CM | POA: Diagnosis not present

## 2022-01-27 ENCOUNTER — Ambulatory Visit (HOSPITAL_COMMUNITY): Payer: Medicaid Other

## 2022-01-27 ENCOUNTER — Ambulatory Visit (HOSPITAL_COMMUNITY)
Admission: RE | Admit: 2022-01-27 | Discharge: 2022-01-27 | Disposition: A | Discharge status: Home / Self Care | Payer: Medicaid Other | Source: Ambulatory Visit | Attending: Orthopedic Surgery | Admitting: Orthopedic Surgery

## 2022-01-27 DIAGNOSIS — T84038A Mechanical loosening of other internal prosthetic joint, initial encounter: Secondary | ICD-10-CM | POA: Insufficient documentation

## 2022-01-27 DIAGNOSIS — Z96659 Presence of unspecified artificial knee joint: Secondary | ICD-10-CM | POA: Insufficient documentation

## 2022-01-27 DIAGNOSIS — T84032A Mechanical loosening of internal right knee prosthetic joint, initial encounter: Secondary | ICD-10-CM | POA: Diagnosis not present

## 2022-01-27 DIAGNOSIS — Z96651 Presence of right artificial knee joint: Secondary | ICD-10-CM | POA: Diagnosis not present

## 2022-01-27 MED ORDER — TECHNETIUM TC 99M MEDRONATE IV KIT
20.0000 | PACK | Freq: Once | INTRAVENOUS | Status: AC | PRN
  Administered 2022-01-27: 21.7 via INTRAVENOUS

## 2022-01-28 DIAGNOSIS — F331 Major depressive disorder, recurrent, moderate: Secondary | ICD-10-CM | POA: Diagnosis not present

## 2022-02-05 ENCOUNTER — Other Ambulatory Visit: Payer: Self-pay | Admitting: Internal Medicine

## 2022-02-05 DIAGNOSIS — E119 Type 2 diabetes mellitus without complications: Secondary | ICD-10-CM | POA: Diagnosis not present

## 2022-02-05 DIAGNOSIS — F331 Major depressive disorder, recurrent, moderate: Secondary | ICD-10-CM | POA: Diagnosis not present

## 2022-02-05 DIAGNOSIS — H2513 Age-related nuclear cataract, bilateral: Secondary | ICD-10-CM | POA: Diagnosis not present

## 2022-02-05 DIAGNOSIS — J449 Chronic obstructive pulmonary disease, unspecified: Secondary | ICD-10-CM

## 2022-02-09 DIAGNOSIS — R32 Unspecified urinary incontinence: Secondary | ICD-10-CM | POA: Diagnosis not present

## 2022-02-09 DIAGNOSIS — R159 Full incontinence of feces: Secondary | ICD-10-CM | POA: Diagnosis not present

## 2022-02-09 DIAGNOSIS — I10 Essential (primary) hypertension: Secondary | ICD-10-CM | POA: Diagnosis not present

## 2022-02-10 ENCOUNTER — Encounter: Payer: Medicaid Other | Admitting: Neurology

## 2022-02-10 ENCOUNTER — Telehealth: Payer: Self-pay

## 2022-02-10 NOTE — Telephone Encounter (Signed)
Spoke to patient and informed due to weather office closing early. Patient ok with being scheduled for 02/20/22 at 12:45pm.

## 2022-02-11 ENCOUNTER — Ambulatory Visit: Payer: Medicaid Other | Admitting: Orthopedic Surgery

## 2022-02-11 DIAGNOSIS — T84038D Mechanical loosening of other internal prosthetic joint, subsequent encounter: Secondary | ICD-10-CM | POA: Diagnosis not present

## 2022-02-11 DIAGNOSIS — Z96659 Presence of unspecified artificial knee joint: Secondary | ICD-10-CM

## 2022-02-11 DIAGNOSIS — F331 Major depressive disorder, recurrent, moderate: Secondary | ICD-10-CM | POA: Diagnosis not present

## 2022-02-11 NOTE — Progress Notes (Signed)
Orthopedic Spine Surgery Office Note   Assessment: Patient is a 62 y.o. female s/p right TKA [name] - 2017), now over 2 months of knee pain. Inflammatory markers wnl. Aspiration from ER showed no organisms. 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      Plan: -Will refer her to Dr. 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    ___________________________________________________________________________     History:   Patient is a 62 y.o. female who presents today for right knee pain. Patient had a right TKA with Dr. [name] in 2017. She had been doing well after that surgery and was ambulating without issue.  She developed pain in the knee over two months ago without any trauma or injury. Swelling around the knee has resolved. Her knee has given out on her 4 times since the last time I saw her. Still having pain. No drainage from the knee. She comes back today to follow-up on the results of the bone scan.  Denies paresthesias and numbness.   Treatments tried: activity modification, knee immobilizer, NSAIDs, tylenol   Physical Exam:   General: no acute distress, appears stated age Neurologic: alert, answering questions appropriately, following commands Respiratory: unlabored breathing on room air, symmetric chest rise Psychiatric: appropriate affect, normal cadence to speech     MSK (RLE): -No swelling on right knee when compared to contralateral side -TTP over the medial joint line -Able to actively range knee from 5-95, passively can get 0-100 but has pain past her active range of motion -Able to extend knee against gravity -EHL/TA/GSC intact -SILT in  s/s/dp/sp/t nerve distributions -Foot warm and well perfused -No sinus track     Imaging: XR of the right knee from 12/31/2021 was previously independently reviewed and interpreted, showing no fracture or dislocation. Lucency around the tibia component baseplate. No lucency around the femoral component.   Bone scan from 01/27/2022 was independently reviewed and interpreted, showing increased tracer uptake around the tibial component on the right knee particularly on the lateral aspect     Patient name: Stacy [name] Patient MRN: [id number] Date of visit: 02/11/22

## 2022-02-12 ENCOUNTER — Other Ambulatory Visit: Payer: Medicaid Other

## 2022-02-12 NOTE — Patient Outreach (Signed)
Medicaid Managed Care Social Work Note  02/12/2022 Name:  Stacy [name] [facility] MRN:  [id number] DOB:  Dec 26, 1960  Stacy [name] is an 62 y.o. year old female who is a primary patient of Marcine Matar, MD.  The Medicaid Managed Care Coordination team was consulted for assistance with:  Community Resources   [name] was given information about Medicaid Managed Care Coordination team services today. Stacy [name] Patient agreed to services and verbal consent obtained.  Engaged with patient  for by telephone forfollow up visit in response to referral for case management and/or care coordination services.   Assessments/Interventions:  Review of past medical history, allergies, medications, health status, including review of consultants reports, laboratory and other test data, was performed as part of comprehensive evaluation and provision of chronic care management services.  SDOH: (Social Determinant of Health) assessments and interventions performed: SDOH Interventions    Flowsheet Row Patient Outreach Telephone from 01/07/2022 in [place] POPULATION [facility] DEPARTMENT Patient Outreach Telephone from 10/21/2021 in [facility] Community Care Coordination  SDOH Interventions    Housing Interventions -- Intervention Not Indicated  Transportation Interventions -- Intervention Not Indicated  Alcohol Usage Interventions Intervention Not Indicated (Score <7) --     BSW completed a telelphone outreach with patient, she stated she is going to have to have knee surgery, she is at home moving things around. Patient states she did receive her correct size for depends, and she has received them for this month and last month. Patient stated she was given a telephone number to schedule her mammogram but it was the wrong number. Patient stated her PCP usually puts the order in but didn't do it this time. BSW will send patients PCP a message. No other resources are needed  at this time.  Advanced Directives Status:  Not addressed in this encounter.  Care Plan                 Allergies  Allergen Reactions   Other Shortness Of Breath    UNSPECIFIED AGENTS Allergic to perfumes and cleaning products   Shellfish Allergy Anaphylaxis    Per allergy test.   Ace Inhibitors Cough and Other (See Comments)        Celecoxib      upset stomach  Pt is taking med    Aspirin Nausea Only    stomach upset     Medications Reviewed Today     Reviewed by Penne Lash, Neysa Bonito, RT (Technologist) on 01/14/22 at 1017  Med List Status: <None>   Medication Order Taking? Sig Documenting Provider Last Dose Status Informant  albuterol (PROVENTIL) (2.5 MG/3ML) 0.083% nebulizer solution [id number] No USE ONE VIAL (2.5 MG TOTAL) BY NEBULIZATION EVERY 6 (SIX) HOURS AS NEEDED FOR WHEEZING. Marcine Matar, MD Taking Active   allopurinol (ZYLOPRIM) 100 MG tablet [id number] No TAKE 1 TABLET (100 MG TOTAL) BY MOUTH DAILY.(AM) Naida Sleight, PA-C Taking Active   atorvastatin (LIPITOR) 40 MG tablet [id number] No TAKE 1 TABLET (40 MG TOTAL) BY MOUTH DAILY. (BEDTIME) Marcine Matar, MD Taking Active   Blood Glucose Monitoring Suppl (ACCU-CHEK AVIVA PLUS) w/Device KIT [id number] No 1 each by Does not apply route 3 (three) times daily. Anders Simmonds, PA-C Taking Active Self  celecoxib (CELEBREX) 200 MG capsule [id number] No Take 200 mg by mouth 2 (two) times daily. [provider] Taking Active   clopidogrel (PLAVIX) 75 MG tablet [id number] No TAKE 1 TABLET (75 MG TOTAL) BY MOUTH DAILY. (  AM) Marcine Matar, MD Taking Active   colchicine 0.6 MG tablet [id number] No 2 tabs PO x 1 today then 1 tab PO daily x 4 days  Patient not taking: Reported on 01/12/2022   Marcine Matar, MD Not Taking Active   diclofenac Sodium (VOLTAREN) 1 % GEL [id number] No Apply 2 g topically 4 (four) times daily. Kerrin Champagne, MD Taking Active   DULoxetine (CYMBALTA) 60 MG capsule [id number] No  TAKE 1 CAPSULE (60 MG TOTAL) BY MOUTH DAILY (AM) Kerrin Champagne, MD Taking Active   EPINEPHrine 0.3 mg/0.3 mL IJ SOAJ injection [id number] No 0.3 mg IM x 1 PRN for allergic reaction Marcine Matar, MD Taking Active Self  [name] 325 (65 Fe) MG tablet [id number] No Take 325 mg by mouth daily. [provider] Taking Active   fluticasone (FLONASE) 50 MCG/ACT nasal spray [id number] No Place 1 spray into both nostrils daily. Marcine Matar, MD Taking Active Self  Discontinued 04/16/11 0943 Glucagon 1 MG/0.2ML SOSY [id number] No 1 mg arrange subcutaneously as needed for severe symptomatic low blood sugar Marcine Matar, MD Taking Active   glucose blood (ACCU-CHEK AVIVA PLUS) test strip [id number] No Use as instructed Marcine Matar, MD Taking Active   glucose chewable tablet 12 g [id number]   Marcine Matar, MD  Active   glycopyrrolate (ROBINUL) 2 MG tablet [id number] No Take 2 mg by mouth 3 (three) times daily. [provider] Taking Active   hydrOXYzine (ATARAX) 10 MG tablet [id number] No TAKE 1 TABLET IN THE MORNING AND NOON AND 2 TABLETS IN THE EVENING AS NEEDED. Marcine Matar, MD Taking Active   Lancets [facility] MULTICLIX) lancets [id number] No Use as instructed Marcine Matar, MD Taking Active   Lancets [facility] SOFT [facility]) lancets [id number] No Use as instructed Anders Simmonds, PA-C Taking Active Self  Discontinued 04/16/11 0916 (Error) loratadine (CLARITIN) 10 MG tablet [id number] No TAKE 1 TABLET (10 MG TOTAL) BY MOUTH DAILY. (AM) Marcine Matar, MD Taking Active   melatonin 5 MG TABS [id number] No 1 tablet in the evening [provider] Taking Active   metFORMIN (GLUCOPHAGE-XR) 750 MG 24 hr tablet [id number] No TAKE 1 TABLET (750 MG TOTAL) BY MOUTH 2 (TWO) TIMES DAILY (AM+BEDTIME) Marcine Matar, MD Taking Active   methocarbamol (ROBAXIN) 750 MG tablet [id number] No SMARTSIG:1 Tablet(s) By Mouth Every 12 Hours PRN Kerrin Champagne, MD Taking  Active   montelukast (SINGULAIR) 10 MG tablet [id number] No Take 1 tablet by mouth daily. [provider] Taking Active   Multiple Vitamins-Minerals (CENTRUM SILVER 50+WOMEN PO) [id number] No Take 1 tablet by mouth daily. [provider] Taking Active Self  nicotine (NICODERM CQ - DOSED IN MG/24 HOURS) 14 mg/24hr patch [id number] No 1 patch to skin  Patient not taking: Reported on 10/21/2021   [provider] Not Taking Active   pantoprazole (PROTONIX) 40 MG tablet [id number] No Take 40 mg by mouth 2 (two) times daily. [provider] Taking Active   potassium chloride SA (KLOR-CON M) 20 MEQ tablet [id number] No TAKE ONE TABLET BY MOUTH ONCE DAILY (NOON) Marcine Matar, MD Taking Active   Respiratory Therapy Supplies (FLUTTER) DEVI [id number] No Use after breathing treatment 4 times daily Storm Frisk, MD Taking Active Self  sucralfate (CARAFATE) 1 g tablet [id number] No Take 1 g by mouth 4 (four) times daily -  with meals and at bedtime.  Patient not taking: Reported on 01/07/2022  [provider] Not Taking Active Self  SYMBICORT 160-4.5 MCG/ACT inhaler [id number] No Inhale 2 puffs into the lungs 2 (two) times daily. [provider] Taking Active   tiotropium (SPIRIVA HANDIHALER) 18 MCG inhalation capsule [id number] No PLACE 1 CAPSULE (18 MCG TOTAL) INTO INHALER AND INHALE DAILY. [name], MD Taking Active   tiZANidine (ZANAFLEX) 2 MG tablet [id number] No Take 2 mg by mouth 3 (three) times daily. [provider] Taking Active   traMADol (ULTRAM) 50 MG tablet [id number] No   Patient not taking: Reported on 01/07/2022   [provider] Not Taking Active   triamcinolone cream (KENALOG) 0.1 % [id number] No Apply 1 application topically 2 (two) times daily. Mayers, Loraine Grip, PA-C Taking Active Self  valsartan-hydrochlorothiazide (DIOVAN-HCT) 160-12.5 MG tablet [id number] No TAKE ONE TABLET BY MOUTH ONCE DAILY (AM) [name], MD Taking Active             Patient Active Problem List   Diagnosis Date Noted   Mixed stress and urge urinary incontinence 12/04/2021   Functional fecal incontinence 12/04/2021   IBS (irritable bowel syndrome) 11/19/2021   Spondylolisthesis, lumbar region    Other spondylosis with radiculopathy, lumbar region    Other secondary scoliosis, lumbar region    Fusion of spine of lumbar region 03/25/2021   Colon polyps 06/06/2020   Lupus ([place]) 06/06/2020   Allergic rhinitis due to animal (cat) (dog) hair and dander 04/08/2020   Allergic rhinitis due to pollen 04/08/2020   Food allergy 04/08/2020   Acute medial meniscus tear, left, subsequent encounter 03/21/2020   Chronic pain of left knee 02/15/2020   Paresthesia of skin 11/16/2019   History of total knee replacement, right 11/16/2019   Tobacco abuse 11/16/2019   Centrilobular emphysema ([place]) 08/10/2019   Incidental lung nodule, > 77mm and < 27mm 08/10/2019   OSA on CPAP 08/10/2019   Dyspnea on exertion 08/02/2019   Hyperlipidemia 08/02/2019   Lumbar radiculopathy 04/24/2019   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   Post laminectomy syndrome 02/23/2019   Abnormality of gait 02/23/2019   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   Unilateral primary osteoarthritis, left hip 12/28/2018   Lesion of skin of left ear 12/26/2018   Primary osteoarthritis of left hip 12/02/2018   Iron deficiency anemia 10/16/2018   Chronic pain syndrome 09/08/2018   Chronic pain of right knee 08/11/2018   Status post lumbar laminectomy 07/15/2018   Peripheral arterial disease ([place]) 04/05/2018   Moderate persistent asthma without complication 53/66/4403   Environmental and seasonal allergies 06/29/2017   Controlled type 2 diabetes mellitus with diabetic polyneuropathy, without long-term current use of insulin ([place]) 06/29/2017   Perennial allergic rhinitis 04/08/2017   Sensorineural hearing loss (SNHL), bilateral 04/08/2017   Chronic  pansinusitis 03/25/2017   Eustachian tube dysfunction, bilateral 47/42/5956   Lichen planopilaris 38/75/6433   Herniation of lumbar intervertebral disc with radiculopathy 10/02/2016    Class: Chronic   Alopecia areata 08/19/2016   Chondromalacia of both patellae 06/03/2015    Class: Chronic   Spinal stenosis, lumbar region, with neurogenic claudication 06/03/2015   Tobacco use disorder 04/25/2015   DJD (degenerative joint disease) of knee 01/04/2015   Hemorrhoid 11/14/2014   Gout of big toe 07/19/2014   Essential hypertension 08/14/2013   Gastroesophageal reflux disease without esophagitis 08/14/2013   COPD (chronic obstructive pulmonary disease) ([place]) 04/17/2011    Conditions to be addressed/monitored per PCP order:   community resources  Care Plan : RN  Care Manager Plan of Care  Updates made by Ethelda Chick since 02/12/2022 12:00 AM     Problem: Health Management needs related to DMII      Long-Range Goal: Development of Plan of Care to address Health Management needs related to DMII   Start Date: 10/21/2021  Expected End Date: 03/09/2022  Priority: High  Note:   Current Barriers:  Chronic Disease Management support and education needs related to DMII-Patient with right knee and toe pain. Being treated for gout by PCP and Orthopedic for knee pain.   RNCM Clinical Goal(s):  Patient will verbalize understanding of plan for management of DMII as evidenced by patient reports take all medications exactly as prescribed and will call provider for medication related questions as evidenced by patient reports and EMR documentation    attend all scheduled medical appointments:  01/12/22 with Neurology, 01/14/22 with Orthopedic, 01/19/22 with PCP for TB testing and 03/20/21 with TFC as evidenced by provider documentation        work with pharmacist to address Medication management related to DMII as evidenced by review of EMR and patient or pharmacist report    work with Education officer, museum  to address Limited access to food related to the management of DMII as evidenced by review of EMR and patient or Education officer, museum report     through collaboration with Consulting civil engineer, provider, and care team.   Interventions: Inter-disciplinary care team collaboration (see longitudinal plan of care) Evaluation of current treatment plan related to  self management and patient's adherence to plan as established by provider Advised patient to contact [facility] and/or Social Services to inquire about medication copay waiver-revisited Reviewed recent ED and PCP visit notes Provided patient with Gout  BSW completed a telelphone outreach with patient, she stated she is going to have to have knee surgery, she is at home moving things around. Patient states she did receive her correct size for depends, and she has received them for this month and last month. Patient stated she was given a telephone number to schedule her mammogram but it was the wrong number. Patient stated her PCP usually puts the order in but didn't do it this time. BSW will send patients PCP a message. No other resources are needed at this time.   Diabetes:  (Status: Goal on Track (progressing): YES.) Long Term Goal   Lab Results  Component Value Date   HGBA1C 6.6 01/05/2022   @ Assessed patient's understanding of A1c goal: <7% Reviewed medications with patient and discussed importance of medication adherence;        Reviewed prescribed diet with patient diabetic diet, advised patient to include protein in meals and snacks, examples given; Provided patient with written educational materials related to hypo and hyperglycemia and importance of correct treatment;       call provider for findings outside established parameters;       Review of patient status, including review of consultants reports, relevant laboratory and other test results, and medications completed;       Provided patient with Urgent Tooth [phone] denture  evaluation Verified patient received lancets, test strips and albuterol nebulizer  Patient Goals/Self-Care Activities: Take medications as prescribed   Attend all scheduled provider appointments Call provider office for new concerns or questions  check blood sugar at prescribed times: twice daily take the blood sugar meter to all doctor visits keep a food diary       Follow up:  Patient agrees to Care Plan and  Follow-up.  Plan: The Managed Medicaid care management team will reach out to the patient again over the next 30-45 days.  Date/time of next scheduled Social Work care management/care coordination outreach:  03/17/22  [name], [name], [facility]  [facility]  High Risk Managed Medicaid Team  [phone]

## 2022-02-12 NOTE — Patient Instructions (Signed)
Visit Information  Stacy [name] was given information about Medicaid Managed [name] team [name] coordination services as a part of their [place] Medicaid benefit. Stacy [name] verbally consented to engagement with the [facility] Managed [name] team.   If you are experiencing a medical emergency, please call 911 or report to your local emergency department or urgent [name].   If you have a non-emergency medical problem during routine business hours, please contact your provider's office and ask to speak with a nurse.   For questions related to your [facility], please call: [phone] or visit the homepage here: [email or web address]  If you would like to schedule transportation through your [facility], please call the following number at least 2 days in advance of your appointment: [phone]   Rides for urgent appointments can also be made after hours by calling Member Services.  Call the [place] at [phone], at any time, 24 hours a day, 7 days a week. If you are in danger or need immediate medical attention call 911.  If you would like help to quit smoking, call 1-800-QUIT-NOW 613-419-6754) OR Espaol: 1-855-Djelo-Ya (7-628-315-1761) o para ms informacin haga clic aqu or Text READY to 200-400 to register via text  Stacy [name] - following are the goals we discussed in your visit today:   Goals Addressed   None      Social Worker will follow up on 03/17/22.   Stacy [name], BSW, [place]  High Risk Managed Medicaid Team  [phone]   Following is a copy of your plan of [name]:  [name] Plan : Stacy [name]  Updates made by Stacy [name] since 02/12/2022 12:00 AM     Problem: Health Management needs related to DMII      Long-Range Goal: Development of Plan of [name] to  address Health Management needs related to DMII   Start Date: 10/21/2021  Expected End Date: 03/09/2022  Priority: High  Note:   Current Barriers:  Chronic Disease Management support and education needs related to DMII-Patient with right knee and toe pain. Being treated for gout by PCP and Orthopedic for knee pain.   RNCM Clinical Goal(s):  Patient will verbalize understanding of plan for management of DMII as evidenced by patient reports take all medications exactly as prescribed and will call provider for medication related questions as evidenced by patient reports and EMR documentation    attend all scheduled medical appointments:  01/12/22 with Neurology, 01/14/22 with Orthopedic, 01/19/22 with PCP for TB testing and 03/20/21 with [facility] as evidenced by provider documentation        work with pharmacist to address Medication management related to DMII as evidenced by review of EMR and patient or pharmacist report    work with Education officer, museum to address Limited access to food related to the management of DMII as evidenced by review of EMR and patient or Education officer, museum report     through collaboration with Consulting civil engineer, provider, and [name] team.   Interventions: Inter-disciplinary [name] team collaboration (see longitudinal plan of [name]) Evaluation of current treatment plan related to  self management and patient's adherence to plan as established by provider Advised patient to contact [facility] and/or Social Services to inquire about medication copay waiver-revisited Reviewed recent ED and PCP visit notes Provided patient with Gout  BSW completed a telelphone outreach with patient, she stated she is going to  have to have knee surgery, she is at home moving things around. Patient states she did receive her correct size for depends, and she has received them for this month and last month. Patient stated she was given a telephone number to schedule her mammogram but it was the wrong number. Patient stated her  PCP usually puts the order in but didn't do it this time. BSW will send patients PCP a message. No other resources are needed at this time.   Diabetes:  (Status: Goal on Track (progressing): YES.) Long Term Goal   Lab Results  Component Value Date   HGBA1C 6.6 01/05/2022   @ Assessed patient's understanding of A1c goal: <7% Reviewed medications with patient and discussed importance of medication adherence;        Reviewed prescribed diet with patient diabetic diet, advised patient to include protein in meals and snacks, examples given; Provided patient with written educational materials related to hypo and hyperglycemia and importance of correct treatment;       call provider for findings outside established parameters;       Review of patient status, including review of consultants reports, relevant laboratory and other test results, and medications completed;       Provided patient with Urgent Tooth [phone] denture evaluation Verified patient received lancets, test strips and albuterol nebulizer  Patient Goals/Self-[name] Activities: Take medications as prescribed   Attend all scheduled provider appointments Call provider office for new concerns or questions  check blood sugar at prescribed times: twice daily take the blood sugar meter to all doctor visits keep a food diary

## 2022-02-18 DIAGNOSIS — F331 Major depressive disorder, recurrent, moderate: Secondary | ICD-10-CM | POA: Diagnosis not present

## 2022-02-18 DIAGNOSIS — H5213 Myopia, bilateral: Secondary | ICD-10-CM | POA: Diagnosis not present

## 2022-02-20 ENCOUNTER — Ambulatory Visit: Payer: Medicaid Other | Admitting: Neurology

## 2022-02-20 DIAGNOSIS — G5601 Carpal tunnel syndrome, right upper limb: Secondary | ICD-10-CM | POA: Diagnosis not present

## 2022-02-20 DIAGNOSIS — R202 Paresthesia of skin: Secondary | ICD-10-CM

## 2022-02-20 NOTE — Procedures (Signed)
[facility] Neurology  [place], [place]  [place], [place] [address] Tel: [phone] Fax: [phone] Test Date:  02/20/2022  Patient: Stacy [name] DOB: 1960/03/26 Physician: Narda Amber, DO  Sex: Female Height: 5\' 6"  Ref Phys: Narda Amber, DO  ID#: [id number]   Technician:    History: This is a 62 year old female referred for evaluation of bilateral hand paresthesias and pain.  NCV & EMG Findings: Extensive electrodiagnostic testing of the right upper extremity and additional studies of the left shows: Bilateral median, ulnar, and mixed palmar sensory responses are within normal limits. Bilateral median and ulnar motor responses are within normal limits.  Of note, there is evidence of a right Martin-Gruber anastomosis, a normal anatomic variant. There is no evidence of active or chronic motor axonal loss changes affecting any of the tested muscles.  Motor unit configuration and recruitment pattern is within normal limits.  Impression: This is a normal study of the upper extremities.  In particular, there is no evidence of carpal tunnel syndrome or a cervical radiculopathy.   ___________________________ Narda Amber, DO    Nerve Conduction Studies   Stim Site NR Peak (ms) Norm Peak (ms) O-P Amp (V) Norm O-P Amp  Left Median Anti Sensory (2nd Digit)  32 C  Wrist    2.9 <3.8 27.5 >10  Right Median Anti Sensory (2nd Digit)  32 C  Wrist    3.0 <3.8 26.7 >10  Left Ulnar Anti Sensory (5th Digit)  32 C  Wrist    2.8 <3.2 20.7 >5  Right Ulnar Anti Sensory (5th Digit)  32 C  Wrist    2.8 <3.2 15.5 >5     Stim Site NR Onset (ms) Norm Onset (ms) O-P Amp (mV) Norm O-P Amp Site1 Site2 Delta-0 (ms) Dist (cm) Vel (m/s) Norm Vel (m/s)  Left Median Motor (Abd Poll Brev)  32 C  Wrist    2.5 <4.0 11.5 >5 Elbow Wrist 5.3 28.0 53 >50  Elbow    7.8  11.0         Right Median Motor (Abd Poll Brev)  32 C  Wrist    2.7 <4.0 10.0 >5 Elbow Wrist 5.0 29.0 58 >50  Elbow     7.7  11.0  Ulnar-wrist crossover Elbow 3.4 0.0    Ulnar-wrist crossover    4.3  2.3         Left Ulnar Motor (Abd Dig Minimi)  32 C  Wrist    2.0 <3.1 10.7 >7 B Elbow Wrist 3.8 22.0 58 >50  B Elbow    5.8  8.7  A Elbow B Elbow 1.8 10.0 56 >50  A Elbow    7.6  8.6         Right Ulnar Motor (Abd Dig Minimi)  32 C  Wrist    2.3 <3.1 10.0 >7 B Elbow Wrist 3.6 20.0 56 >50  B Elbow    5.9  8.5  A Elbow B Elbow 1.9 10.0 53 >50  A Elbow    7.8  7.4            Stim Site NR Peak (ms) Norm Peak (ms) P-T Amp (V) Site1 Site2 Delta-P (ms) Norm Delta (ms)  Left Median/Ulnar Palm Comparison (Wrist - 8cm)  32 C  Median Palm    1.7 <2.2 42.2 Median Palm Ulnar Palm 0.2   Ulnar Palm    1.5 <2.2 13.7      Right Median/Ulnar Palm Comparison (Wrist - 8cm)  32 C  Median Palm    1.7 <2.2 26.8 Median Palm Ulnar Palm 0.1   Ulnar Palm    1.8 <2.2 11.1       Electromyography   Side Muscle Ins.Act Fibs Fasc Recrt Amp Dur Poly Activation Comment  Right 1stDorInt Nml Nml Nml Nml Nml Nml Nml Nml N/A  Right PronatorTeres Nml Nml Nml Nml Nml Nml Nml Nml N/A  Right Biceps Nml Nml Nml Nml Nml Nml Nml Nml N/A  Right Triceps Nml Nml Nml Nml Nml Nml Nml Nml N/A  Right Deltoid Nml Nml Nml Nml Nml Nml Nml Nml N/A  Left 1stDorInt Nml Nml Nml Nml Nml Nml Nml Nml N/A  Left PronatorTeres Nml Nml Nml Nml Nml Nml Nml Nml N/A  Left Biceps Nml Nml Nml Nml Nml Nml Nml Nml N/A  Left Triceps Nml Nml Nml Nml Nml Nml Nml Nml N/A  Left Deltoid Nml Nml Nml Nml Nml Nml Nml Nml N/A      Waveforms:

## 2022-02-25 DIAGNOSIS — F331 Major depressive disorder, recurrent, moderate: Secondary | ICD-10-CM | POA: Diagnosis not present

## 2022-03-02 ENCOUNTER — Ambulatory Visit: Payer: Medicaid Other | Admitting: Orthopaedic Surgery

## 2022-03-02 ENCOUNTER — Ambulatory Visit
Admission: RE | Admit: 2022-03-02 | Discharge: 2022-03-02 | Disposition: A | Discharge status: Home / Self Care | Payer: Medicaid Other | Source: Ambulatory Visit | Attending: Internal Medicine | Admitting: Internal Medicine

## 2022-03-02 ENCOUNTER — Encounter: Payer: Self-pay | Admitting: Orthopaedic Surgery

## 2022-03-02 DIAGNOSIS — T84062D Wear of articular bearing surface of internal prosthetic right knee joint, subsequent encounter: Secondary | ICD-10-CM

## 2022-03-02 DIAGNOSIS — T84032A Mechanical loosening of internal right knee prosthetic joint, initial encounter: Secondary | ICD-10-CM | POA: Insufficient documentation

## 2022-03-02 DIAGNOSIS — T84012D Broken internal right knee prosthesis, subsequent encounter: Secondary | ICD-10-CM

## 2022-03-02 DIAGNOSIS — T84032D Mechanical loosening of internal right knee prosthetic joint, subsequent encounter: Secondary | ICD-10-CM | POA: Diagnosis not present

## 2022-03-02 DIAGNOSIS — Z1231 Encounter for screening mammogram for malignant neoplasm of breast: Secondary | ICD-10-CM

## 2022-03-02 NOTE — Progress Notes (Signed)
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  She had a right knee replaced in 2017.  She required at least 2 manipulations under anesthesia by the orthopedic surgeon who replaced her knee.  She has been having symptoms of intermittent swelling and pain as well as instability at right knee.  A workup by one of our orthopedic colleagues showed aseptic loosening of the right knee.  Aspirations of the knee were negative for infection and all of her infectious parameters labs are normal.  The three-phase bone scan and plain films were indicative and suggestive of prosthetic loosening.  Examination of her right knee today shows some slight laxity of the knee on exam.  There is no redness and no warmth and no effusion today fortunately.  I went over her imaging studies as well as a knee replacement model.  I described in detail what a revision would involve.  We had explained in detail that there is always risk of finding deep infection where the implants would need to stay out.  Hopefully this would be a revision of the tibial tray and polythene only versus the entire implant including femoral component and tibial component depending on her intraoperative findings.  At this point we are recommending this is the neck step for her and she agrees with this as well.  I reviewed all of her medications as well as past medical history and other notes within epic.  All question concerns were answered addressed.  We will work on getting her scheduled for a total knee revision arthroplasty of the right knee.  This is due to again polyethylene wear with aseptic loosening/failure of the prosthesis/loosening of the prosthesis.

## 2022-03-04 DIAGNOSIS — F331 Major depressive disorder, recurrent, moderate: Secondary | ICD-10-CM | POA: Diagnosis not present

## 2022-03-05 DIAGNOSIS — R32 Unspecified urinary incontinence: Secondary | ICD-10-CM | POA: Diagnosis not present

## 2022-03-05 DIAGNOSIS — I10 Essential (primary) hypertension: Secondary | ICD-10-CM | POA: Diagnosis not present

## 2022-03-05 DIAGNOSIS — R159 Full incontinence of feces: Secondary | ICD-10-CM | POA: Diagnosis not present

## 2022-03-09 ENCOUNTER — Other Ambulatory Visit: Payer: Medicaid Other | Admitting: *Deleted

## 2022-03-09 ENCOUNTER — Encounter: Payer: Self-pay | Admitting: Physician Assistant

## 2022-03-09 ENCOUNTER — Telehealth: Payer: Self-pay | Admitting: Internal Medicine

## 2022-03-09 ENCOUNTER — Other Ambulatory Visit: Payer: Self-pay | Admitting: Internal Medicine

## 2022-03-09 ENCOUNTER — Encounter: Payer: Self-pay | Admitting: *Deleted

## 2022-03-09 DIAGNOSIS — R918 Other nonspecific abnormal finding of lung field: Secondary | ICD-10-CM

## 2022-03-09 DIAGNOSIS — I10 Essential (primary) hypertension: Secondary | ICD-10-CM

## 2022-03-09 NOTE — Patient Outreach (Signed)
Medicaid Managed Care   Nurse Care Manager Note  03/09/2022 Name:  Stacy [name] [facility] MRN:  [id number] DOB:  23-Oct-1960  Stacy [name] is an 62 y.o. year old female who is a primary patient of [name], MD.  The [facility] Managed Care Coordination team was consulted for assistance with:    Knee pain  [name] was given information about Medicaid Managed Care Coordination team services today. Stacy [name] Patient agreed to services and verbal consent obtained.  Engaged with patient by telephone for follow up visit in response to provider referral for case management and/or care coordination services.   Assessments/Interventions:  Review of past medical history, allergies, medications, health status, including review of consultants reports, laboratory and other test data, was performed as part of comprehensive evaluation and provision of chronic care management services.  SDOH (Social Determinants of Health) assessments and interventions performed: SDOH Interventions    Flowsheet Row Patient Outreach Telephone from 03/09/2022 in [place] Patient Outreach Telephone from 01/07/2022 in [place] Patient Outreach Telephone from 10/21/2021 in [place] Coordination  SDOH Interventions     Food Insecurity Interventions Intervention Not Indicated -- --  Housing Interventions -- -- Intervention Not Indicated  Transportation Interventions Intervention Not Indicated -- Intervention Not Indicated  Alcohol Usage Interventions -- Intervention Not Indicated (Score <7) --       Care Plan  Allergies  Allergen Reactions   Other Shortness Of Breath    UNSPECIFIED AGENTS Allergic to perfumes and cleaning products   Shellfish Allergy Anaphylaxis    Per allergy test.   Ace Inhibitors Cough and Other (See Comments)        Celecoxib      upset stomach  Pt is taking med     Aspirin Nausea Only    stomach upset     Medications Reviewed Today     Reviewed by [name], RN (Registered Nurse) on 03/09/22 at [phone]  Med List Status: <None>   Medication Order Taking? Sig Documenting Provider Last Dose Status Informant  albuterol (PROVENTIL) (2.5 MG/3ML) 0.083% nebulizer solution [id number] Yes USE ONE VIAL (2.5 MG TOTAL) BY NEBULIZATION EVERY 6 (SIX) HOURS AS NEEDED FOR WHEEZING. [name], MD Taking Active   allopurinol (ZYLOPRIM) 100 MG tablet [id number] Yes TAKE 1 TABLET (100 MG TOTAL) BY MOUTH DAILY.(AM) [name], PA-C Taking Active   atorvastatin (LIPITOR) 40 MG tablet [id number] Yes TAKE 1 TABLET (40 MG TOTAL) BY MOUTH DAILY. (BEDTIME) [name], MD Taking Active   Blood Glucose Monitoring Suppl (ACCU-CHEK AVIVA PLUS) w/Device KIT [id number] Yes 1 each by Does not apply route 3 (three) times daily. Argentina Donovan, PA-C Taking Active Self  celecoxib (CELEBREX) 200 MG capsule [id number] Yes Take 200 mg by mouth 2 (two) times daily. [provider] Taking Active   clopidogrel (PLAVIX) 75 MG tablet [id number] Yes TAKE 1 TABLET (75 MG TOTAL) BY MOUTH DAILY. (AM) [name], MD Taking Active   colchicine 0.6 MG tablet [id number] No 2 tabs PO x 1 today then 1 tab PO daily x 4 days  Patient not taking: Reported on 03/09/2022   [name], MD Not Taking Active            Med Note Thamas Jaegers, Patricia Fargo A   Mon Mar 09, 2022  9:40 AM) completed  diclofenac Sodium (VOLTAREN) 1 % GEL [id number] Yes Apply 2 g topically 4 (four) times  daily. [name], MD Taking Active   DULoxetine (CYMBALTA) 60 MG capsule [id number] Yes TAKE 1 CAPSULE (60 MG TOTAL) BY MOUTH DAILY (AM) [name], MD Taking Active   EPINEPHrine 0.3 mg/0.3 mL IJ SOAJ injection [id number] Yes 0.3 mg IM x 1 PRN for allergic reaction [name], MD Taking Active Self  [name] 325 (65 Fe) MG tablet [id number] Yes Take 325 mg by mouth daily. [provider]  Taking Active   fluticasone (FLONASE) 50 MCG/ACT nasal spray [id number] Yes Place 1 spray into both nostrils daily. [name], MD Taking Active Self    Discontinued 04/16/11 0943 Glucagon 1 MG/0.2ML SOSY [id number] Yes 1 mg arrange subcutaneously as needed for severe symptomatic low blood sugar [name], MD Taking Active   glucose blood (ACCU-CHEK AVIVA PLUS) test strip [id number] Yes Use as instructed [name], MD Taking Active   glucose chewable tablet 12 g [id number]   [name], MD  Active   glycopyrrolate (ROBINUL) 2 MG tablet [id number] Yes Take 2 mg by mouth 3 (three) times daily. [provider] Taking Active   hydrOXYzine (ATARAX) 10 MG tablet [id number] Yes TAKE 1 TABLET IN THE MORNING AND NOON AND 2 TABLETS IN THE EVENING AS NEEDED. [name], MD Taking Active   Lancets (ACCU-CHEK MULTICLIX) lancets [id number] Yes Use as instructed [name], MD Taking Active   Lancets [facility] SOFT [facility]) lancets [id number] Yes Use as instructed Argentina Donovan, PA-C Taking Active Self    Discontinued 04/16/11 0916 (Error) loratadine (CLARITIN) 10 MG tablet [id number] Yes TAKE 1 TABLET (10 MG TOTAL) BY MOUTH DAILY. (AM) [name], MD Taking Active   melatonin 5 MG TABS [id number] Yes 1 tablet in the evening [provider] Taking Active   metFORMIN (GLUCOPHAGE-XR) 750 MG 24 hr tablet [id number] Yes TAKE 1 TABLET (750 MG TOTAL) BY MOUTH 2 (TWO) TIMES DAILY (AM+BEDTIME) [name], MD Taking Active   methocarbamol (ROBAXIN) 750 MG tablet [id number] No SMARTSIG:1 Tablet(s) By Mouth Every 12 Hours PRN  Patient not taking: Reported on 03/09/2022   [name], MD Not Taking Active            Med Note Thamas Jaegers, Kendan Cornforth A   Mon Mar 09, 2022  9:43 AM) Needs refilled  montelukast (SINGULAIR) 10 MG tablet [id number] Yes Take 1 tablet by mouth daily. [provider] Taking Active   Multiple Vitamins-Minerals (CENTRUM SILVER  50+WOMEN PO) [id number] Yes Take 1 tablet by mouth daily. [provider] Taking Active Self  nicotine (NICODERM CQ - DOSED IN MG/24 HOURS) 14 mg/24hr patch [id number] No 1 patch to skin  Patient not taking: Reported on 10/21/2021   [provider] Not Taking Active   pantoprazole (PROTONIX) 40 MG tablet [id number] Yes Take 40 mg by mouth 2 (two) times daily. [provider] Taking Active   potassium chloride SA (KLOR-CON M) 20 MEQ tablet [id number] Yes TAKE ONE TABLET BY MOUTH ONCE DAILY (NOON) [name], MD Taking Active   Respiratory Therapy Supplies (FLUTTER) DEVI [id number] Yes Use after breathing treatment 4 times daily Elsie Stain, MD Taking Active Self  sucralfate (CARAFATE) 1 g tablet [id number] Yes Take 1 g by mouth 4 (four) times daily -  with meals and at bedtime. [provider] Taking Active Self  SYMBICORT 160-4.5 MCG/ACT inhaler [id number] Yes Inhale 2 puffs into the lungs 2 (two) times daily. [provider] Taking Active   tiotropium (  SPIRIVA HANDIHALER) 18 MCG inhalation capsule [id number] Yes PLACE 1 CAPSULE (18 MCG TOTAL) INTO INHALER AND INHALE DAILY. [name], MD Taking Active   tiZANidine (ZANAFLEX) 2 MG tablet [id number] Yes Take 2 mg by mouth 3 (three) times daily. [provider] Taking Active   traMADol (ULTRAM) 50 MG tablet [id number] No   Patient not taking: Reported on 01/07/2022   [provider] Not Taking Active   triamcinolone cream (KENALOG) 0.1 % [id number] Yes Apply 1 application topically 2 (two) times daily. Mayers, Loraine Grip, PA-C Taking Active Self  valsartan-hydrochlorothiazide (DIOVAN-HCT) 160-12.5 MG tablet [id number] Yes TAKE ONE TABLET BY MOUTH ONCE DAILY (AM) [name], MD Taking Active             Patient Active Problem List   Diagnosis Date Noted   Failed total knee, right, subsequent encounter 03/02/2022   Loose right total knee arthroplasty ([place]) 03/02/2022    Mixed stress and urge urinary incontinence 12/04/2021   Functional fecal incontinence 12/04/2021   IBS (irritable bowel syndrome) 11/19/2021   Spondylolisthesis, lumbar region    Other spondylosis with radiculopathy, lumbar region    Other secondary scoliosis, lumbar region    Fusion of spine of lumbar region 03/25/2021   Colon polyps 06/06/2020   Lupus ([place]) 06/06/2020   Allergic rhinitis due to animal (cat) (dog) hair and dander 04/08/2020   Allergic rhinitis due to pollen 04/08/2020   Food allergy 04/08/2020   Acute medial meniscus tear, left, subsequent encounter 03/21/2020   Chronic pain of left knee 02/15/2020   Paresthesia of skin 11/16/2019   History of total knee replacement, right 11/16/2019   Tobacco abuse 11/16/2019   Centrilobular emphysema ([place]) 08/10/2019   Incidental lung nodule, > 19mm and < 48mm 08/10/2019   OSA on CPAP 08/10/2019   Dyspnea on exertion 08/02/2019   Hyperlipidemia 08/02/2019   Lumbar radiculopathy 04/24/2019   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   Post laminectomy syndrome 02/23/2019   Abnormality of gait 02/23/2019   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   Unilateral primary osteoarthritis, left hip 12/28/2018   Lesion of skin of left ear 12/26/2018   Primary osteoarthritis of left hip 12/02/2018   Iron deficiency anemia 10/16/2018   Chronic pain syndrome 09/08/2018   Chronic pain of right knee 08/11/2018   Status post lumbar laminectomy 07/15/2018   Peripheral arterial disease ([place]) 04/05/2018   Moderate persistent asthma without complication 84/66/5993   Environmental and seasonal allergies 06/29/2017   Controlled type 2 diabetes mellitus with diabetic polyneuropathy, without long-term current use of insulin ([place]) 06/29/2017   Perennial allergic rhinitis 04/08/2017   Sensorineural hearing loss (SNHL), bilateral 04/08/2017   Chronic pansinusitis 03/25/2017   Eustachian tube dysfunction, bilateral 57/02/7791   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   Herniation of lumbar intervertebral disc with radiculopathy 10/02/2016    Class: Chronic   Alopecia areata 08/19/2016   Chondromalacia of both patellae 06/03/2015    Class: Chronic   Spinal stenosis, lumbar region, with neurogenic claudication 06/03/2015   Tobacco use disorder 04/25/2015   DJD (degenerative joint disease) of knee 01/04/2015   Hemorrhoid 11/14/2014   Gout of big toe 07/19/2014   Essential hypertension 08/14/2013   Gastroesophageal reflux disease without esophagitis 08/14/2013   COPD (chronic obstructive pulmonary disease) ([place]) 04/17/2011    Conditions to be addressed/monitored per PCP order:   knee pain  Care Plan : RN Care Manager Plan of Care  Updates made by [name], RN since  03/09/2022 12:00 AM     Problem: Health Management needs related to DMII      Long-Range Goal: Development of Plan of Care to address Health Management needs related to DMII   Start Date: 10/21/2021  Expected End Date: 05/01/2022  Priority: High  Note:   Current Barriers:  Chronic Disease Management support and education needs related to DMII-Patient with right knee pain. Will be scheduled for a total knee revision. Patient requesting chest CT as recommended from CT performed in February 2023.   RNCM Clinical Goal(s):  Patient will verbalize understanding of plan for management of DMII as evidenced by patient reports take all medications exactly as prescribed and will call provider for medication related questions as evidenced by patient reports and EMR documentation    attend all scheduled medical appointments:   03/20/21 with TFC, 03/17/22 with BSW  as evidenced by provider documentation        work with pharmacist to address Medication management related to DMII as evidenced by review of EMR and patient or pharmacist report    work with Child psychotherapist to address Limited access to food related to the management of DMII as evidenced by review of EMR and patient or Arts development officer report     through collaboration with Medical illustrator, provider, and care team.   Interventions: Inter-disciplinary care team collaboration (see longitudinal plan of care) Evaluation of current treatment plan related to  self management and patient's adherence to plan as established by provider Advised patient to contact [facility] [phone] and/or Social Services to inquire about medication copay waiver-revisited Review of recent Neurology and Orthopedic notes and discussed with patient Collaborated with PCP requesting chest CT order as advised on chest CT performed February 2023   Right Knee Pain  (Status:  New goal.)  Long Term Goal Evaluation of current treatment plan related to  right knee pain ,  self-management and patient's adherence to plan as established by provider. Discussed plans with patient for ongoing care management follow up and provided patient with direct contact information for care management team Advised patient to schedule surgery, participate in PT after surgery for best results Reviewed medications with patient and discussed requesting refills on needed medications Assessed social determinant of health barriers RNCM reviewed Orthopedic notes and discussed   Diabetes:  (Status: Goal Met.) Long Term Goal   Lab Results  Component Value Date   HGBA1C 6.6 01/05/2022   @ Assessed patient's understanding of A1c goal: <7% Reviewed medications with patient and discussed importance of medication adherence;        Reviewed prescribed diet with patient diabetic diet, advised patient to include protein in meals and snacks, examples given; Provided patient with written educational materials related to hypo and hyperglycemia and importance of correct treatment;       call provider for findings outside established parameters;       Review of patient status, including review of consultants reports, relevant laboratory and other test results, and medications completed;        Provided patient with Urgent Tooth [phone] denture evaluation Verified patient received lancets, test strips and albuterol nebulizer  Patient Goals/Self-Care Activities: Take medications as prescribed   Attend all scheduled provider appointments Call provider office for new concerns or questions  check blood sugar at prescribed times: twice daily take the blood sugar meter to all doctor visits keep a food diary       Follow Up:  Patient agrees to Care Plan and Follow-up.  Plan: The Managed Medicaid care management team will reach out to the patient again over the next 30 days.  Date/time of next scheduled RN care management/care coordination outreach:  04/08/22 @ 9am  [name] RN, BSN [place]  [employer]

## 2022-03-09 NOTE — Patient Instructions (Signed)
Visit Information  Stacy [name] was given information about Medicaid Managed Care team care coordination services as a part of their [place] Medicaid benefit. Stefan Church Chauvin verbally consented to engagement with the [facility] Managed Care team.   If you are experiencing a medical emergency, please call 911 or report to your local emergency department or urgent care.   If you have a non-emergency medical problem during routine business hours, please contact your provider's office and ask to speak with a nurse.   For questions related to your [facility], please call: [phone] or visit the homepage here: [email or web address]  If you would like to schedule transportation through your [facility], please call the following number at least 2 days in advance of your appointment: [phone]   Rides for urgent appointments can also be made after hours by calling Member Services.  Call the [place] at [phone], at any time, 24 hours a day, 7 days a week. If you are in danger or need immediate medical attention call 911.  If you would like help to quit smoking, call 1-800-QUIT-NOW (646)036-5184) OR Espaol: 1-855-Djelo-Ya (9-678-938-1017) o para ms informacin haga clic aqu or Text READY to 200-400 to register via text  Ms. Butzer,   Please see education materials related to knee pain provided by MyChart link.  Patient verbalizes understanding of instructions and care plan provided today and agrees to view in [place]. Active MyChart status and patient understanding of how to access instructions and care plan via MyChart confirmed with patient.     Telephone follow up appointment with Managed Medicaid care management team member scheduled for:04/08/22 @ [place] RN, [place] RN Care  Coordinator   Following is a copy of your plan of care:  Care Plan : RN Care Manager Plan of Care  Updates made by [name], RN since 03/09/2022 12:00 AM     Problem: Health Management needs related to DMII      Long-Range Goal: Development of Plan of Care to address Health Management needs related to DMII   Start Date: 10/21/2021  Expected End Date: 05/01/2022  Priority: High  Note:   Current Barriers:  Chronic Disease Management support and education needs related to DMII-Patient with right knee pain. Will be scheduled for a total knee revision. Patient requesting chest CT as recommended from CT performed in February 2023.   RNCM Clinical Goal(s):  Patient will verbalize understanding of plan for management of DMII as evidenced by patient reports take all medications exactly as prescribed and will call provider for medication related questions as evidenced by patient reports and EMR documentation    attend all scheduled medical appointments:   03/20/21 with TFC, 03/17/22 with BSW  as evidenced by provider documentation        work with pharmacist to address Medication management related to DMII as evidenced by review of EMR and patient or pharmacist report    work with Education officer, museum to address Limited access to food related to the management of DMII as evidenced by review of EMR and patient or Education officer, museum report     through collaboration with Consulting civil engineer, provider, and care team.   Interventions: Inter-disciplinary care team collaboration (see longitudinal plan of care) Evaluation of current treatment plan related to  self management and patient's adherence to plan as established by provider Advised patient to contact [facility] [phone] and/or  Social Services to inquire about medication copay waiver-revisited Review of recent Neurology and Orthopedic notes and discussed with patient Collaborated with PCP requesting chest CT order as advised on chest CT performed February  2023   Right Knee Pain  (Status:  New goal.)  Long Term Goal Evaluation of current treatment plan related to  right knee pain ,  self-management and patient's adherence to plan as established by provider. Discussed plans with patient for ongoing care management follow up and provided patient with direct contact information for care management team Advised patient to schedule surgery, participate in PT after surgery for best results Reviewed medications with patient and discussed requesting refills on needed medications Assessed social determinant of health barriers RNCM reviewed Orthopedic notes and discussed   Diabetes:  (Status: Goal Met.) Long Term Goal   Lab Results  Component Value Date   HGBA1C 6.6 01/05/2022   @ Assessed patient's understanding of A1c goal: <7% Reviewed medications with patient and discussed importance of medication adherence;        Reviewed prescribed diet with patient diabetic diet, advised patient to include protein in meals and snacks, examples given; Provided patient with written educational materials related to hypo and hyperglycemia and importance of correct treatment;       call provider for findings outside established parameters;       Review of patient status, including review of consultants reports, relevant laboratory and other test results, and medications completed;       Provided patient with Urgent Tooth [phone] denture evaluation Verified patient received lancets, test strips and albuterol nebulizer  Patient Goals/Self-Care Activities: Take medications as prescribed   Attend all scheduled provider appointments Call provider office for new concerns or questions  check blood sugar at prescribed times: twice daily take the blood sugar meter to all doctor visits keep a food diary

## 2022-03-09 NOTE — Telephone Encounter (Signed)
-----   Message from [name], RN sent at 03/09/2022 10:12 AM EST ----- Regarding: CT order Hi Dr. [name],  Ms. Gallacher is requesting a chest CT. The Chest CT done in February 2023, recommended a follow up CT in one year. Can you place the order? Thank you  [name] RN, BSN [place] RN Care Coordinator

## 2022-03-10 NOTE — Telephone Encounter (Signed)
Requested Prescriptions  Pending Prescriptions Disp Refills   loratadine (CLARITIN) 10 MG tablet [Pharmacy Med Name: LORATADINE 10 MG ORAL TABLET] 90 tablet 0    Sig: TAKE 1 TABLET (10 MG TOTAL) BY MOUTH DAILY. (AM)     Ear, Nose, and Throat:  Antihistamines 2 Passed - 03/09/2022  3:44 PM      Passed - Cr in normal range and within 360 days    Creat  Date Value Ref Range Status  03/11/2016 0.90 0.50 - 1.05 mg/dL Final    Comment:      For patients > or = 63 years of age: The upper reference limit for Creatinine is approximately 13% higher for people identified as African-American.      Creatinine, Ser  Date Value Ref Range Status  01/05/2022 0.86 0.57 - 1.00 mg/dL Final   Creatinine, POC  Date Value Ref Range Status  06/10/2016 200 mg/dL Final         Passed - Valid encounter within last 12 months    Recent Outpatient Visits           2 months ago Type 2 diabetes mellitus with peripheral neuropathy [facility])   [place], Neoma Laming B, MD   3 months ago Administrative encounter   [place] Karle Plumber B, MD   6 months ago Type 2 diabetes mellitus with hypoglycemia without coma, without long-term current use of insulin [facility])   [place] [name], MD   10 months ago Hospital discharge follow-up   [place], MD   11 months ago Generalized pruritus   [place], Vernia Buff, NP       Future Appointments             In 1 month [name], MD [place]             valsartan-hydrochlorothiazide (DIOVAN-HCT) 160-12.5 MG tablet [Pharmacy Med Name: VALSARTAN-HYDROCHLOROTHIAZIDE 160-12.5 MG ORAL TABLET] 90 tablet 0    Sig: TAKE ONE TABLET BY MOUTH ONCE DAILY (AM)     Cardiovascular: ARB + Diuretic Combos  Passed - 03/09/2022  3:44 PM      Passed - K in normal range and within 180 days    Potassium  Date Value Ref Range Status  01/05/2022 4.4 3.5 - 5.2 mmol/L Final         Passed - Na in normal range and within 180 days    Sodium  Date Value Ref Range Status  01/05/2022 140 134 - 144 mmol/L Final         Passed - Cr in normal range and within 180 days    Creat  Date Value Ref Range Status  03/11/2016 0.90 0.50 - 1.05 mg/dL Final    Comment:      For patients > or = 63 years of age: The upper reference limit for Creatinine is approximately 13% higher for people identified as African-American.      Creatinine, Ser  Date Value Ref Range Status  01/05/2022 0.86 0.57 - 1.00 mg/dL Final   Creatinine, POC  Date Value Ref Range Status  06/10/2016 200 mg/dL Final         Passed - eGFR is 10 or above and within 180 days    GFR, Est African American  Date  Value Ref Range Status  03/11/2016 83 >=60 mL/min Final   GFR calc Af Amer  Date Value Ref Range Status  05/03/2019 >60 >60 mL/min Final   GFR, Est Non African American  Date Value Ref Range Status  03/11/2016 72 >=60 mL/min Final   GFR, Estimated  Date Value Ref Range Status  03/26/2021 60 (L) >60 mL/min Final    Comment:    (NOTE) Calculated using the CKD-EPI Creatinine Equation (2021)    eGFR  Date Value Ref Range Status  01/05/2022 76 >59 mL/min/1.73 Final         Passed - Patient is not pregnant      Passed - Last BP in normal range    BP Readings from Last 1 Encounters:  01/12/22 131/85         Passed - Valid encounter within last 6 months    Recent Outpatient Visits           2 months ago Type 2 diabetes mellitus with peripheral neuropathy [facility])   [place] [name], MD   3 months ago Administrative encounter   [place] Karle Plumber B, MD   6 months ago Type 2 diabetes mellitus with hypoglycemia without coma,  without long-term current use of insulin [facility])   [place] [name], MD   10 months ago Hospital discharge follow-up   [place], MD   11 months ago Generalized pruritus   [place] Gildardo Pounds, NP       Future Appointments             In 1 month [name], Dalbert Batman, MD [place]

## 2022-03-11 DIAGNOSIS — F331 Major depressive disorder, recurrent, moderate: Secondary | ICD-10-CM | POA: Diagnosis not present

## 2022-03-17 ENCOUNTER — Other Ambulatory Visit: Payer: Medicaid Other

## 2022-03-17 NOTE — Patient Outreach (Signed)
  Medicaid Managed Care   Unsuccessful Outreach Note  03/17/2022 Name: Stacy [name] MRN: [id number] DOB: 06/29/1960  Referred by: Johnson, Deborah B, MD Reason for referral : High Risk Managed Medicaid (MM social work unsuccessful telephone outreach )   An unsuccessful telephone outreach was attempted today. The patient was referred to the case management team for assistance with care management and care coordination.   Follow Up Plan: A HIPAA compliant phone message was left for the patient providing contact information and requesting a return call.   Sharlyne Koeneman, BSW, MHA [facility]    High Risk Managed Medicaid Team  [phone]  

## 2022-03-17 NOTE — Patient Instructions (Signed)
  Medicaid Managed Care   Unsuccessful Outreach Note  03/17/2022 Name: Stacy [name] [facility] MRN: [id number] DOB: [birth year]  Referred by: [name], MD Reason for referral : High Risk Managed Medicaid (MM social work unsuccessful telephone outreach )   An unsuccessful telephone outreach was attempted today. The patient was referred to the case management team for assistance with care management and care coordination.   Follow Up Plan: A HIPAA compliant phone message was left for the patient providing contact information and requesting a return call.   Mickel Fuchs, BSW, [place] Managed Medicaid Team  [phone]

## 2022-03-18 DIAGNOSIS — F331 Major depressive disorder, recurrent, moderate: Secondary | ICD-10-CM | POA: Diagnosis not present

## 2022-03-20 ENCOUNTER — Encounter: Payer: Self-pay | Admitting: Podiatry

## 2022-03-20 ENCOUNTER — Ambulatory Visit (INDEPENDENT_AMBULATORY_CARE_PROVIDER_SITE_OTHER): Payer: Medicaid Other | Admitting: Podiatry

## 2022-03-20 VITALS — BP 126/70

## 2022-03-20 DIAGNOSIS — Z860100 Personal history of colon polyps, unspecified: Secondary | ICD-10-CM | POA: Insufficient documentation

## 2022-03-20 DIAGNOSIS — B351 Tinea unguium: Secondary | ICD-10-CM

## 2022-03-20 DIAGNOSIS — M79676 Pain in unspecified toe(s): Secondary | ICD-10-CM | POA: Diagnosis not present

## 2022-03-20 DIAGNOSIS — E1142 Type 2 diabetes mellitus with diabetic polyneuropathy: Secondary | ICD-10-CM | POA: Diagnosis not present

## 2022-03-20 DIAGNOSIS — I739 Peripheral vascular disease, unspecified: Secondary | ICD-10-CM

## 2022-03-20 NOTE — Progress Notes (Signed)
  Subjective:  Patient ID: Stacy [name], female    DOB: November 18, 1960,  MRN: [id number]  Stacy [name] presents to clinic today for at risk foot care with history of diabetic neuropathy and painful elongated mycotic toenails 1-5 bilaterally which are tender when wearing enclosed shoe gear. Pain is relieved with periodic professional debridement.  Chief Complaint  Patient presents with   Nail Problem    [facility] BS-Did not check today A1C-6.6 PCP-Stacy [name] PCP VST-09/06/2022   New problem(s): None.   Patient states she has to undergo surgery on her right knee again.  PCP is Stacy Pier, MD.  Allergies  Allergen Reactions   Other Shortness Of Breath    UNSPECIFIED AGENTS Allergic to perfumes and cleaning products   Shellfish Allergy Anaphylaxis    Per allergy test.   Ace Inhibitors Cough and Other (See Comments)        Celecoxib      upset stomach  Pt is taking med    Aspirin Nausea Only    stomach upset     Review of Systems: Negative except as noted in the HPI.  Objective: No changes noted in today's physical examination. Vitals:   03/20/22 0951  BP: 126/70   [name] is a pleasant 62 y.o. female WD, WN in NAD. AAO x 3.  Vascular Examination: CFT <3 seconds b/l LE. Palpable DP pulse(s) b/l LE. Diminished PT pulse(s) b/l LE. Pedal hair absent. No pain with calf compression b/l. Lower extremity skin temperature gradient within normal limits. No edema noted b/l LE. No cyanosis or clubbing noted b/l LE.  Dermatological Examination: Pedal skin is warm and supple b/l LE. No open wounds b/l LE. No interdigital macerations noted b/l LE. Toenails 1-5 b/l elongated, discolored, dystrophic, thickened, crumbly with subungual debris and tenderness to dorsal palpation. No hyperkeratotic nor porokeratotic lesions present on today's visit.  Neurological Examination: Pt has subjective symptoms of neuropathy. Protective sensation intact 5/5 intact  bilaterally with 10g monofilament b/l. Vibratory sensation intact b/l.  Musculoskeletal Examination: Normal muscle strength 5/5 to all lower extremity muscle groups bilaterally. HAV with bunion bilaterally and hammertoes 2-5 b/l.Marland Kitchen No pain, crepitus or joint limitation noted with ROM b/l LE.  Patient ambulates independently without assistive aids.  Assessment/Plan: 1. Pain due to onychomycosis of toenail   2. PAD (peripheral artery disease) ([place])   3. Diabetic peripheral neuropathy associated with type 2 diabetes mellitus ([place])     -Consent given for treatment as described below: -Examined patient. -Continue foot and shoe inspections daily. Monitor blood glucose per PCP/Endocrinologist's recommendations. -Mycotic toenails 1-5 bilaterally were debrided in length and girth with sterile nail nippers and dremel without incident. -Patient/POA to call should there be question/concern in the interim.   Return in about 3 months (around 06/18/2022).  Marzetta Board, DPM

## 2022-03-25 DIAGNOSIS — F331 Major depressive disorder, recurrent, moderate: Secondary | ICD-10-CM | POA: Diagnosis not present

## 2022-03-31 ENCOUNTER — Telehealth: Payer: Self-pay | Admitting: Pharmacist

## 2022-03-31 NOTE — Progress Notes (Signed)
Received a voicemail from patient asking for help in obtaining a mattress for her hospital bed. Routing to case management staff.   Catie Hedwig Morton, PharmD, [place], [facility] [phone]

## 2022-04-01 DIAGNOSIS — F331 Major depressive disorder, recurrent, moderate: Secondary | ICD-10-CM | POA: Diagnosis not present

## 2022-04-03 ENCOUNTER — Other Ambulatory Visit: Payer: Self-pay | Admitting: Internal Medicine

## 2022-04-03 DIAGNOSIS — R32 Unspecified urinary incontinence: Secondary | ICD-10-CM | POA: Diagnosis not present

## 2022-04-03 DIAGNOSIS — I10 Essential (primary) hypertension: Secondary | ICD-10-CM | POA: Diagnosis not present

## 2022-04-07 ENCOUNTER — Inpatient Hospital Stay: Admission: RE | Admit: 2022-04-07 | Payer: Medicaid Other | Source: Ambulatory Visit

## 2022-04-08 ENCOUNTER — Telehealth: Payer: Self-pay | Admitting: Orthopaedic Surgery

## 2022-04-08 ENCOUNTER — Telehealth: Payer: Self-pay | Admitting: Orthopedic Surgery

## 2022-04-08 ENCOUNTER — Other Ambulatory Visit: Payer: Medicaid Other | Admitting: *Deleted

## 2022-04-08 DIAGNOSIS — F331 Major depressive disorder, recurrent, moderate: Secondary | ICD-10-CM | POA: Diagnosis not present

## 2022-04-08 NOTE — Telephone Encounter (Signed)
Patient states she is covered thru insurance..[phone]- [employer]

## 2022-04-08 NOTE — Telephone Encounter (Signed)
Patient stating Dr. [name] gave her a hospital bed over 10 years ago and she needs a new mattress for it. I told her to start with her insurance and call them to see what they can and will pay for to replace something 63 years old. Or if she needs a new one To call me back and let me know what she needs from [place]

## 2022-04-08 NOTE — Patient Outreach (Signed)
Care Coordination  04/08/2022  Stacy [name] [facility] 1960/05/07 PM:[id number]   Successful outreach with [name] today. However, after a brief conversation she had to end this call and requested to reschedule this appointment. She has therapy every Wednesday and needed to take the incoming call. A new visit was scheduled for 04/16/22 @ [place] RN, BSN [place] RN Care Coordinator

## 2022-04-08 NOTE — Telephone Encounter (Signed)
Patient called advised she will need a new mattress for her bed at home after she have surgery. The number to contact patient is [phone]

## 2022-04-14 ENCOUNTER — Other Ambulatory Visit: Payer: Self-pay

## 2022-04-15 DIAGNOSIS — F331 Major depressive disorder, recurrent, moderate: Secondary | ICD-10-CM | POA: Diagnosis not present

## 2022-04-16 ENCOUNTER — Other Ambulatory Visit: Payer: Medicaid Other | Admitting: *Deleted

## 2022-04-16 NOTE — Patient Instructions (Signed)
Visit Information  [name]  - as a part of your Medicaid benefit, you are eligible for care management and care coordination services at no cost or copay. I was unable to reach you by phone today but would be happy to help you with your health related needs. Please feel free to call me @ [phone].   A member of the Managed Medicaid care management team will reach out to you again over the next 7 days.   [name] RN, BSN [place]  [employer] manager

## 2022-04-16 NOTE — Patient Outreach (Signed)
  Medicaid Managed Care   Unsuccessful Attempt Note   04/16/2022 Name: Stacy [name] [facility] MRN: [id number] DOB: 1960/04/04  Referred by: [name], MD Reason for referral : High Risk Managed Medicaid (Unsuccessful RNCM follow up telephone outreach)   An unsuccessful telephone outreach was attempted today. The patient was referred to the case management team for assistance with care management and care coordination.    Follow Up Plan: A HIPAA compliant phone message was left for the patient providing contact information and requesting a return call. and The Managed Medicaid care management team will reach out to the patient again over the next 7 days.    [name] RN, BSN [place]  [employer] manager

## 2022-04-17 DIAGNOSIS — H524 Presbyopia: Secondary | ICD-10-CM | POA: Diagnosis not present

## 2022-04-21 ENCOUNTER — Telehealth: Payer: Self-pay | Admitting: Orthopaedic Surgery

## 2022-04-21 ENCOUNTER — Encounter: Payer: Self-pay | Admitting: *Deleted

## 2022-04-21 ENCOUNTER — Other Ambulatory Visit: Payer: Medicaid Other | Admitting: *Deleted

## 2022-04-21 NOTE — Telephone Encounter (Signed)
Pt called stating Dr Ninfa Linden need to submit order for new mattress for hospital bed for pt to be sent to [facility] fax number [phone]. Pt phone number is [phone].

## 2022-04-21 NOTE — Patient Outreach (Signed)
Medicaid Managed Care   Nurse Care Manager Note  04/21/2022 Name:  Litia Tadesse [facility] MRN:  [id number] DOB:  28-Sep-1960  Stacy [name] Skeen is an 62 y.o. year old female who is a primary patient of [name], MD.  The [facility] Managed Care Coordination team was consulted for assistance with:    Knee pain  Ms. Olesky was given information about Medicaid Managed Care Coordination team services today. Stacy [name] Epting Patient agreed to services and verbal consent obtained.  Engaged with patient by telephone for follow up visit in response to provider referral for case management and/or care coordination services.   Assessments/Interventions:  Review of past medical history, allergies, medications, health status, including review of consultants reports, laboratory and other test data, was performed as part of comprehensive evaluation and provision of chronic care management services.  SDOH (Social Determinants of Health) assessments and interventions performed: SDOH Interventions    Flowsheet Row Patient Outreach Telephone from 03/09/2022 in [place] Patient Outreach Telephone from 01/07/2022 in [place] Patient Outreach Telephone from 10/21/2021 in [place] Coordination  SDOH Interventions     Food Insecurity Interventions Intervention Not Indicated -- --  Housing Interventions -- -- Intervention Not Indicated  Transportation Interventions Intervention Not Indicated -- Intervention Not Indicated  Alcohol Usage Interventions -- Intervention Not Indicated (Score <7) --       Care Plan  Allergies  Allergen Reactions   Other Shortness Of Breath    UNSPECIFIED AGENTS Allergic to perfumes and cleaning products   Shellfish Allergy Anaphylaxis    Per allergy test.   Ace Inhibitors Cough and Other (See Comments)        Celecoxib      upset stomach  Pt is taking med     Aspirin Nausea Only    stomach upset     Medications Reviewed Today     Reviewed by [name], RN (Registered Nurse) on 04/21/22 at [place] List Status: <None>   Medication Order Taking? Sig Documenting Provider Last Dose Status Informant  albuterol (PROVENTIL) (2.5 MG/3ML) 0.083% nebulizer solution [id number] Yes USE ONE VIAL (2.5 MG TOTAL) BY NEBULIZATION EVERY 6 (SIX) HOURS AS NEEDED FOR WHEEZING. [name], MD Taking Active   allopurinol (ZYLOPRIM) 100 MG tablet [id number] Yes TAKE 1 TABLET (100 MG TOTAL) BY MOUTH DAILY.(AM) [name], PA-C Taking Active   atorvastatin (LIPITOR) 40 MG tablet [id number] Yes TAKE 1 TABLET (40 MG TOTAL) BY MOUTH DAILY. (BEDTIME) [name], MD Taking Active   Blood Glucose Monitoring Suppl (ACCU-CHEK AVIVA PLUS) w/Device KIT [id number]  1 each by Does not apply route 3 (three) times daily. Argentina Donovan, [place]  Active Self  celecoxib (CELEBREX) 200 MG capsule [id number] Yes Take 200 mg by mouth 2 (two) times daily. [provider] Taking Active   clopidogrel (PLAVIX) 75 MG tablet [id number] Yes TAKE 1 TABLET (75 MG TOTAL) BY MOUTH DAILY. (AM) [name], MD Taking Active   colchicine 0.6 MG tablet [id number] No 2 tabs PO x 1 today then 1 tab PO daily x 4 days  Patient not taking: Reported on 03/09/2022   [name], MD Not Taking Active            Med Note Thamas Jaegers, Tammatha Cobb A   Mon Mar 09, 2022  9:40 AM) completed  diclofenac Sodium (VOLTAREN) 1 % GEL [id number] Yes Apply 2 g topically 4 (four) times  daily. [name], MD Taking Active   DULoxetine (CYMBALTA) 60 MG capsule [id number] Yes TAKE 1 CAPSULE (60 MG TOTAL) BY MOUTH DAILY (AM) [name], MD Taking Active   EPINEPHrine 0.3 mg/0.3 mL IJ SOAJ injection [id number]  0.3 mg IM x 1 PRN for allergic reaction [name], MD  Active Self  [name] 325 (65 Fe) MG tablet [id number] Yes Take 325 mg by mouth daily. [provider] Taking Active    fluticasone (FLONASE) 50 MCG/ACT nasal spray [id number] Yes Place 1 spray into both nostrils daily. [name], MD Taking Active Self    Discontinued 04/16/11 0943 Glucagon 1 MG/0.2ML SOSY [id number] No 1 mg arrange subcutaneously as needed for severe symptomatic low blood sugar  Patient not taking: Reported on 04/21/2022   [name], MD Not Taking Active   glucose blood (ACCU-CHEK AVIVA PLUS) test strip [id number]  Use as instructed [name], MD  Active   glucose chewable tablet 12 g [id number]   [name], MD  Active   glycopyrrolate (ROBINUL) 2 MG tablet [id number] Yes Take 2 mg by mouth 3 (three) times daily. [provider] Taking Active   hydrOXYzine (ATARAX) 10 MG tablet [id number] Yes TAKE 1 TABLET IN THE MORNING AND NOON AND 2 TABLETS IN THE EVENING AS NEEDED. [name], MD Taking Active   Lancets (ACCU-CHEK MULTICLIX) lancets [id number] Yes Use as instructed [name], MD Taking Active   Lancets [facility] SOFT [facility]) lancets [id number] Yes Use as instructed Argentina Donovan, PA-C Taking Active Self    Discontinued 04/16/11 0916 (Error) loratadine (CLARITIN) 10 MG tablet [id number] Yes TAKE 1 TABLET (10 MG TOTAL) BY MOUTH DAILY. (AM) [name], MD Taking Active   melatonin 5 MG TABS [id number] Yes 1 tablet in the evening [provider] Taking Active   metFORMIN (GLUCOPHAGE-XR) 750 MG 24 hr tablet [id number] Yes TAKE 1 TABLET (750 MG TOTAL) BY MOUTH 2 (TWO) TIMES DAILY (AM+BEDTIME) [name], MD Taking Active   methocarbamol (ROBAXIN) 750 MG tablet [id number] Yes SMARTSIG:1 Tablet(s) By Mouth Every 12 Hours PRN [name], MD Taking Active            Med Note Thamas Jaegers, Anam Bobby A   Mon Mar 09, 2022  9:43 AM) Needs refilled  montelukast (SINGULAIR) 10 MG tablet [id number] Yes Take 1 tablet by mouth daily. [provider] Taking Active   Multiple Vitamins-Minerals (CENTRUM SILVER 50+WOMEN PO) [id number] Yes  Take 1 tablet by mouth daily. [provider] Taking Active Self  nicotine (NICODERM CQ - DOSED IN MG/24 HOURS) 14 mg/24hr patch [id number] No 1 patch to skin  Patient not taking: Reported on 10/21/2021   [provider] Not Taking Active            Med Note Thamas Jaegers, Ileana Chalupa A   Tue Apr 21, 2022  3:38 PM) Quit smoking in June 2023  pantoprazole (PROTONIX) 40 MG tablet [id number] Yes Take 40 mg by mouth 2 (two) times daily. [provider] Taking Active   potassium chloride SA (KLOR-CON M) 20 MEQ tablet [id number] Yes TAKE ONE TABLET BY MOUTH ONCE DAILY (NOON) [name], MD Taking Active   Respiratory Therapy Supplies (FLUTTER) DEVI [id number] Yes Use after breathing treatment 4 times daily Elsie Stain, MD Taking Active Self  sucralfate (CARAFATE) 1 g tablet [id number] Yes Take 1 g by mouth 4 (four) times daily -  with meals and at bedtime. [provider] Taking Active Self  SYMBICORT 160-4.5 MCG/ACT inhaler [id number] Yes Inhale 2 puffs into the lungs 2 (two) times daily. [provider] Taking Active   tiotropium (SPIRIVA HANDIHALER) 18 MCG inhalation capsule [id number] Yes PLACE 1 CAPSULE (18 MCG TOTAL) INTO INHALER AND INHALE DAILY. [name], MD Taking Active   tiZANidine (ZANAFLEX) 2 MG tablet [id number] No Take 2 mg by mouth 3 (three) times daily.  Patient not taking: Reported on 04/21/2022   [provider] Not Taking Active   traMADol (ULTRAM) 50 MG tablet [id number] No   Patient not taking: Reported on 01/07/2022   [provider] Not Taking Active   triamcinolone cream (KENALOG) 0.1 % [id number] Yes Apply 1 application topically 2 (two) times daily. Mayers, Loraine Grip, PA-C Taking Active Self  valsartan-hydrochlorothiazide (DIOVAN-HCT) 160-12.5 MG tablet [id number] Yes TAKE ONE TABLET BY MOUTH ONCE DAILY (AM) [name], MD Taking Active             Patient Active Problem List   Diagnosis Date Noted    Personal history of colonic polyps 03/20/2022   Failed total knee, right, subsequent encounter 03/02/2022   Loose right total knee arthroplasty ([place]) 03/02/2022   Mixed stress and urge urinary incontinence 12/04/2021   Functional fecal incontinence 12/04/2021   IBS (irritable bowel syndrome) 11/19/2021   Spondylolisthesis, lumbar region    Other spondylosis with radiculopathy, lumbar region    Other secondary scoliosis, lumbar region    Fusion of spine of lumbar region 03/25/2021   Colon polyps 06/06/2020   Lupus ([place]) 06/06/2020   Allergic rhinitis due to animal (cat) (dog) hair and dander 04/08/2020   Allergic rhinitis due to pollen 04/08/2020   Food allergy 04/08/2020   Acute medial meniscus tear, left, subsequent encounter 03/21/2020   Chronic pain of left knee 02/15/2020   Paresthesia of skin 11/16/2019   History of total knee replacement, right 11/16/2019   Tobacco abuse 11/16/2019   Centrilobular emphysema ([place]) 08/10/2019   Incidental lung nodule, > 18mm and < 36mm 08/10/2019   OSA on CPAP 08/10/2019   Dyspnea on exertion 08/02/2019   Hyperlipidemia 08/02/2019   Lumbar radiculopathy 04/24/2019   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   Post laminectomy syndrome 02/23/2019   Abnormality of gait 02/23/2019   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   Unilateral primary osteoarthritis, left hip 12/28/2018   Lesion of skin of left ear 12/26/2018   Primary osteoarthritis of left hip 12/02/2018   Iron deficiency anemia 10/16/2018   Chronic pain syndrome 09/08/2018   Chronic pain of right knee 08/11/2018   Status post lumbar laminectomy 07/15/2018   Peripheral arterial disease ([place]) 04/05/2018   Moderate persistent asthma without complication [id number]   Environmental and seasonal allergies 06/29/2017   Controlled type 2 diabetes mellitus with diabetic polyneuropathy, without long-term current use of insulin ([place]) 06/29/2017   Perennial allergic rhinitis 04/08/2017    Sensorineural hearing loss (SNHL), bilateral 04/08/2017   Chronic pansinusitis 03/25/2017   Eustachian tube dysfunction, bilateral 0000000   Lichen planopilaris [id number]   Herniation of lumbar intervertebral disc with radiculopathy 10/02/2016    Class: Chronic   Alopecia areata 08/19/2016   Chondromalacia of both patellae 06/03/2015    Class: Chronic   Spinal stenosis, lumbar region, with neurogenic claudication 06/03/2015   Tobacco use disorder 04/25/2015   DJD (degenerative joint disease) of knee 01/04/2015   Hemorrhoid 11/14/2014   Gout of big toe 07/19/2014   Essential hypertension 08/14/2013   Gastroesophageal  reflux disease without esophagitis 08/14/2013   COPD (chronic obstructive pulmonary disease) ([place]) 04/17/2011    Conditions to be addressed/monitored per PCP order:   knee pain  Care Plan : RN Care Manager Plan of Care  Updates made by [name], RN since 04/21/2022 12:00 AM     Problem: Health Management needs related to DMII      Long-Range Goal: Development of Plan of Care to address Health Management needs related to DMII   Start Date: 10/21/2021  Expected End Date: 06/02/2022  Priority: High  Note:   Current Barriers:  Chronic Disease Management support and education needs related to DMII-Patient with right knee pain. Scheduled for a total knee revision on 05/12/22.  Working to get a new mattress for her hospital bed. She got new glasses last week. Had an IBS flare 3/4-3/10/24 and did not attend scheduled GI imaging.  RNCM Clinical Goal(s):  Patient will verbalize understanding of plan for management of DMII as evidenced by patient reports take all medications exactly as prescribed and will call provider for medication related questions as evidenced by patient reports and EMR documentation    attend all scheduled medical appointments:   PCP 05/07/22, Surgery on 05/12/22 and Post Op on 05/27/22  as evidenced by provider documentation        work with pharmacist  to address Medication management related to DMII as evidenced by review of EMR and patient or pharmacist report    work with Education officer, museum to address Limited access to food related to the management of DMII as evidenced by review of EMR and patient or Education officer, museum report     through collaboration with Consulting civil engineer, provider, and care team.   Interventions: Inter-disciplinary care team collaboration (see longitudinal plan of care) Evaluation of current treatment plan related to  self management and patient's adherence to plan as established by provider Advised patient to follow up with GI regarding missed imaging visit-per appointment note, patient needs to call to reschedule  Right Knee Pain  (Status:  Goal on track:  Yes.)  Long Term Goal Evaluation of current treatment plan related to  right knee pain ,  self-management and patient's adherence to plan as established by provider. Discussed plans with patient for ongoing care management follow up and provided patient with direct contact information for care management team Reviewed medications with patient and discussed requesting refills on needed medications Assessed social determinant of health barriers Discussed upcoming right knee replacement  Verified that patient would have help at home during recovery period Discussed the importance in BS management in the healing process  Patient Goals/Self-Care Activities: Take medications as prescribed   Attend all scheduled provider appointments Call provider office for new concerns or questions  check blood sugar at prescribed times: twice daily take the blood sugar meter to all doctor visits keep a food diary       Follow Up:  Patient agrees to Care Plan and Follow-up.  Plan: The Managed Medicaid care management team will reach out to the patient again over the next 30 days.  Date/time of next scheduled RN care management/care coordination outreach:  05/20/22 @ 10:30am  [name] RN, BSN [place] RN Care Coordinator

## 2022-04-21 NOTE — Patient Instructions (Signed)
Visit Information  Stacy [name] was given information about Medicaid Managed Care team care coordination services as a part of their [place] Medicaid benefit. Stacy [name] verbally consented to engagement with the [facility] Managed Care team.   If you are experiencing a medical emergency, please call 911 or report to your local emergency department or urgent care.   If you have a non-emergency medical problem during routine business hours, please contact your provider's office and ask to speak with a nurse.   For questions related to your [facility], please call: [phone] or visit the homepage here: [email or web address]  If you would like to schedule transportation through your [facility], please call the following number at least 2 days in advance of your appointment: [phone]   Rides for urgent appointments can also be made after hours by calling Member Services.  Call the [place] at [phone], at any time, 24 hours a day, 7 days a week. If you are in danger or need immediate medical attention call 911.  If you would like help to quit smoking, call 1-800-QUIT-NOW 801 694 9329) OR Espaol: 1-855-Djelo-Ya HD:1601594) o para ms informacin haga clic aqu or Text READY to 200-400 to register via text  Stacy [name],   Please see education materials related to managing knee pain after surgery provided by MyChart link.  Patient verbalizes understanding of instructions and care plan provided today and agrees to view in [place]. Active MyChart status and patient understanding of how to access instructions and care plan via MyChart confirmed with patient.     Telephone follow up appointment with Managed Medicaid care management team member scheduled for:05/20/22 @ 10:30am  Stacy Joiner RN, BSN [place] RN Care Coordinator   Following is a copy of your plan of care:  Care Plan : RN Care Manager Plan of Care  Updates made by Stacy Montane, RN since 04/21/2022 12:00 AM     Problem: Health Management needs related to DMII      Long-Range Goal: Development of Plan of Care to address Health Management needs related to DMII   Start Date: 10/21/2021  Expected End Date: 06/02/2022  Priority: High  Note:   Current Barriers:  Chronic Disease Management support and education needs related to DMII-Patient with right knee pain. Scheduled for a total knee revision on 05/12/22.  Working to get a new mattress for her hospital bed. She got new glasses last week. Had an IBS flare 3/4-3/10/24 and did not attend scheduled GI imaging.  RNCM Clinical Goal(s):  Patient will verbalize understanding of plan for management of DMII as evidenced by patient reports take all medications exactly as prescribed and will call provider for medication related questions as evidenced by patient reports and EMR documentation    attend all scheduled medical appointments:   PCP 05/07/22, Surgery on 05/12/22 and Post Op on 05/27/22  as evidenced by provider documentation        work with pharmacist to address Medication management related to DMII as evidenced by review of EMR and patient or pharmacist report    work with Education officer, museum to address Limited access to food related to the management of DMII as evidenced by review of EMR and patient or Education officer, museum report     through collaboration with Consulting civil engineer, provider, and care team.   Interventions: Inter-disciplinary care team collaboration (see longitudinal plan of care) Evaluation of current  treatment plan related to  self management and patient's adherence to plan as established by provider Advised patient to follow up with GI regarding missed imaging visit-per appointment note, patient needs to call to reschedule  Right Knee Pain  (Status:  Goal on  track:  Yes.)  Long Term Goal Evaluation of current treatment plan related to  right knee pain ,  self-management and patient's adherence to plan as established by provider. Discussed plans with patient for ongoing care management follow up and provided patient with direct contact information for care management team Reviewed medications with patient and discussed requesting refills on needed medications Assessed social determinant of health barriers Discussed upcoming right knee replacement  Verified that patient would have help at home during recovery period Discussed the importance in BS management in the healing process  Patient Goals/Self-Care Activities: Take medications as prescribed   Attend all scheduled provider appointments Call provider office for new concerns or questions  check blood sugar at prescribed times: twice daily take the blood sugar meter to all doctor visits keep a food diary

## 2022-04-22 DIAGNOSIS — F331 Major depressive disorder, recurrent, moderate: Secondary | ICD-10-CM | POA: Diagnosis not present

## 2022-04-22 NOTE — Telephone Encounter (Signed)
Faxed to provided number  

## 2022-04-29 DIAGNOSIS — F331 Major depressive disorder, recurrent, moderate: Secondary | ICD-10-CM | POA: Diagnosis not present

## 2022-04-29 NOTE — Pre-Procedure Instructions (Signed)
Surgical Instructions    Your procedure is scheduled on May 12, 2022.  Report to [facility] Main Entrance "A" at 11:50 A.M., then check in with the Admitting office.  Call this number if you have problems the morning of surgery:  [phone]   If you have any questions prior to your surgery date call [phone]: Open Monday-Friday 8am-4pm If you experience any cold or flu symptoms such as cough, fever, chills, shortness of breath, etc. between now and your scheduled surgery, please notify us at the above number     Remember:  Do not eat after midnight the night before your surgery  You may drink clear liquids until 10:50AM the morning of your surgery.   Clear liquids allowed are: Water, Non-Citrus Juices (without pulp), Carbonated Beverages, Clear Tea, Black Coffee ONLY (NO MILK, CREAM OR POWDERED CREAMER of any kind), and Gatorade    Take these medicines the morning of surgery with A SIP OF WATER:  allopurinol (ZYLOPRIM)  DULoxetine (CYMBALTA)  glycopyrrolate (ROBINUL)  hydrOXYzine (ATARAX)  loratadine (CLARITIN)  montelukast (SINGULAIR)  pantoprazole (PROTONIX)  fluticasone (FLONASE) 50 MCG/ACT nasal spray  SYMBICORT 160-4.5 MCG/ACT inhaler  tiotropium (SPIRIVA HANDIHALER)   If needed: albuterol (PROVENTIL) (2.5 MG/3ML) 0.083% nebulizer solution   Please bring all inhalers with you the day of surgery.   Follow your surgeon's instructions on when to stop clopidogrel (Plavix).  If no instructions were given by your surgeon then you will need to call the office to get those instructions.    As of today, STOP taking any Aspirin (unless otherwise instructed by your surgeon) Aleve, Naproxen, Ibuprofen, Motrin, Advil, Goody's, BC's, all herbal medications, fish oil, celecoxib (CELEBREX), diclofenac Sodium (VOLTAREN) 1 % GEL, and all vitamins.             [facility] is not responsible for any belongings or valuables.    Do NOT Smoke (Tobacco/Vaping)  24 hours prior to your  procedure  If you use a CPAP at night, you may bring your mask for your overnight stay.   Contacts, glasses, hearing aids, dentures or partials may not be worn into surgery, please bring cases for these belongings   For patients admitted to the hospital, discharge time will be determined by your treatment team.   Patients discharged the day of surgery will not be allowed to drive home, and someone needs to stay with them for 24 hours.   SURGICAL WAITING ROOM VISITATION Patients having surgery or a procedure may have no more than 2 support people in the waiting area - these visitors may rotate.   Children under the age of 84 must have an adult with them who is not the patient. If the patient needs to stay at the hospital during part of their recovery, the visitor guidelines for inpatient rooms apply. Pre-op nurse will coordinate an appropriate time for 1 support person to accompany patient in pre-op.  This support person may not rotate.   Please refer to [email or web address] for the visitor guidelines for Inpatients (after your surgery is over and you are in a regular room).    Special instructions:    Oral Hygiene is also important to reduce your risk of infection.  Remember - BRUSH YOUR TEETH THE MORNING OF SURGERY WITH YOUR REGULAR TOOTHPASTE   [place]- Preparing For Surgery  Before surgery, you can play an important role. Because skin is not sterile, your skin needs to be as free of germs as possible. You can reduce the number  of germs on your skin by washing with CHG (chlorahexidine gluconate) Soap before surgery.  CHG is an antiseptic cleaner which kills germs and bonds with the skin to continue killing germs even after washing.     Please do not use if you have an allergy to CHG or antibacterial soaps. If your skin becomes reddened/irritated stop using the CHG.  Do not shave (including legs and underarms) for at least 48 hours  prior to first CHG shower. It is OK to shave your face.  Please follow these instructions carefully.     Shower the NIGHT BEFORE SURGERY and the MORNING OF SURGERY with CHG Soap.   If you chose to wash your hair, wash your hair first as usual with your normal shampoo. After you shampoo, rinse your hair and body thoroughly to remove the shampoo.  Then [employer] and genitals (private parts) with your normal soap and rinse thoroughly to remove soap.  After that Use CHG Soap as you would any other liquid soap. You can apply CHG directly to the skin and wash gently with a scrungie or a clean washcloth.   Apply the CHG Soap to your body ONLY FROM THE NECK DOWN.  Do not use on open wounds or open sores. Avoid contact with your eyes, ears, mouth and genitals (private parts). Wash Face and genitals (private parts)  with your normal soap.   Wash thoroughly, paying special attention to the area where your surgery will be performed.  Thoroughly rinse your body with warm water from the neck down.  DO NOT shower/wash with your normal soap after using and rinsing off the CHG Soap.  Pat yourself dry with a CLEAN TOWEL.  Wear CLEAN PAJAMAS to bed the night before surgery  Place CLEAN SHEETS on your bed the night before your surgery  DO NOT SLEEP WITH PETS.   Day of Surgery:  Take a shower with CHG soap. Wear Clean/Comfortable clothing the morning of surgery Do not wear jewelry or makeup. Do not wear lotions, powders, perfumes/cologne or deodorant. Do not shave 48 hours prior to surgery.  Men may shave face and neck. Do not bring valuables to the hospital. Do not wear nail polish, gel polish, artificial nails, or any other type of covering on natural nails (fingers and toes) If you have artificial nails or gel coating that need to be removed by a nail salon, please have this removed prior to surgery. Artificial nails or gel coating may interfere with anesthesia's ability to adequately monitor your  vital signs. Remember to brush your teeth WITH YOUR REGULAR TOOTHPASTE.    If you received a COVID test during your pre-op visit, it is requested that you wear a mask when out in public, stay away from anyone that may not be feeling well, and notify your surgeon if you develop symptoms. If you have been in contact with anyone that has tested positive in the last 10 days, please notify your surgeon.    Please read over the following fact sheets that you were given.

## 2022-04-30 ENCOUNTER — Telehealth: Payer: Self-pay | Admitting: Orthopaedic Surgery

## 2022-04-30 ENCOUNTER — Other Ambulatory Visit: Payer: Self-pay

## 2022-04-30 ENCOUNTER — Encounter (HOSPITAL_COMMUNITY)
Admission: RE | Admit: 2022-04-30 | Discharge: 2022-04-30 | Disposition: A | Discharge status: Home / Self Care | Payer: Medicaid Other | Source: Ambulatory Visit | Attending: Orthopaedic Surgery | Admitting: Orthopaedic Surgery

## 2022-04-30 ENCOUNTER — Encounter (HOSPITAL_COMMUNITY): Payer: Self-pay

## 2022-04-30 ENCOUNTER — Other Ambulatory Visit: Payer: Self-pay | Admitting: Orthopaedic Surgery

## 2022-04-30 ENCOUNTER — Other Ambulatory Visit: Payer: Self-pay | Admitting: Internal Medicine

## 2022-04-30 VITALS — BP 124/75 | HR 84 | Temp 98.3°F | Resp 18 | Ht 66.0 in | Wt 160.6 lb

## 2022-04-30 DIAGNOSIS — E119 Type 2 diabetes mellitus without complications: Secondary | ICD-10-CM

## 2022-04-30 DIAGNOSIS — Z01818 Encounter for other preprocedural examination: Secondary | ICD-10-CM | POA: Diagnosis not present

## 2022-04-30 DIAGNOSIS — E1142 Type 2 diabetes mellitus with diabetic polyneuropathy: Secondary | ICD-10-CM

## 2022-04-30 DIAGNOSIS — J449 Chronic obstructive pulmonary disease, unspecified: Secondary | ICD-10-CM

## 2022-04-30 LAB — BASIC METABOLIC PANEL
Anion gap: 12 (ref 5–15)
BUN: 7 mg/dL — ABNORMAL LOW (ref 8–23)
CO2: 28 mmol/L (ref 22–32)
Calcium: 9.8 mg/dL (ref 8.9–10.3)
Chloride: 97 mmol/L — ABNORMAL LOW (ref 98–111)
Creatinine, Ser: 0.83 mg/dL (ref 0.44–1.00)
GFR, Estimated: >60 mL/min (ref >60)
Glucose, Bld: 106 mg/dL — ABNORMAL HIGH (ref 70–99)
Potassium: 3.5 mmol/L (ref 3.5–5.1)
Sodium: 137 mmol/L (ref 135–145)

## 2022-04-30 LAB — CBC
HCT: 38.6 % (ref 36.0–46.0)
Hemoglobin: 12.9 g/dL (ref 12.0–15.0)
MCH: 27.2 pg (ref 26.0–34.0)
MCHC: 33.4 g/dL (ref 30.0–36.0)
MCV: 81.4 fL (ref 80.0–100.0)
Platelets: 338 10*3/uL (ref 150–400)
RBC: 4.74 MIL/uL (ref 3.87–5.11)
RDW: 14.8 % (ref 11.5–15.5)
WBC: 9 10*3/uL (ref 4.0–10.5)
nRBC: 0 % (ref 0.0–0.2)

## 2022-04-30 LAB — TYPE AND SCREEN
ABO/RH(D): O POS
Antibody Screen: NEGATIVE

## 2022-04-30 LAB — GLUCOSE, CAPILLARY: Glucose-Capillary: 113 mg/dL — ABNORMAL HIGH (ref 70–99)

## 2022-04-30 LAB — SURGICAL PCR SCREEN
MRSA, PCR: NEGATIVE
Staphylococcus aureus: POSITIVE — AB

## 2022-04-30 MED ORDER — DULOXETINE HCL 60 MG PO CPEP: ORAL_CAPSULE | ORAL | 6 refills | Status: DC

## 2022-04-30 NOTE — Progress Notes (Signed)
Almedia Balls with Dr. Trevor Mace office made aware of positive Staph PCR and that patient needs surgical orders placed.

## 2022-04-30 NOTE — Telephone Encounter (Signed)
Is this ok? Last given by Dr. [name]

## 2022-04-30 NOTE — Progress Notes (Addendum)
PCP - Karle Plumber Cardiologist - Quay Burow  PPM/ICD - n/a Device Orders - n/a Rep Notified - n/a  Chest x-ray - n/a EKG - 04/30/22 Stress Test - denies ECHO - 08/25/19 Cardiac Cath - denies  Sleep Study - +OSA. Wears CPAP nightly. Does not know pressure settings   CBG today- 113 Checks Blood Sugar three times a day Range 110-125 per patient  Last dose of GLP1 agonist-  n/a GLP1 instructions: n/a  Blood Thinner Instructions: Patient states she was instructed to stop taking Plavix on 05/04/22 Aspirin Instructions:n/a  ERAS Protcol - Clear liquids until 1150 am day of surgery PRE-SURGERY Ensure or G2- n/a  COVID TEST- n/a   Anesthesia review: Yes. Abnormal EKG  Patient denies shortness of breath, fever, cough and chest pain at PAT appointment   All instructions explained to the patient, with a verbal understanding of the material. Patient agrees to go over the instructions while at home for a better understanding. The opportunity to ask questions was provided.

## 2022-04-30 NOTE — Telephone Encounter (Signed)
Dariys Engineer, building services) called from [employer] requesting script of Duloxetine 60mg . Phone number is [phone].

## 2022-05-01 LAB — HEMOGLOBIN A1C
Hgb A1c MFr Bld: 6.6 % — ABNORMAL HIGH (ref 4.8–5.6)
Mean Plasma Glucose: 143 mg/dL

## 2022-05-01 NOTE — Progress Notes (Signed)
Anesthesia Chart Review:  62 year old female with history of OSA on CPAP, non-insulin-dependent DM2, HTN, former smoker with associated COPD maintained on Spiriva and Symbicort, GERD, asthma.  Patient was seen by cardiologist Dr. Gwenlyn Found on 08/02/2019 for evaluation of DOE and PAD.  Echo, coronary calcium score, and lower extremity Dopplers ordered.  Dr. Gwenlyn Found commented on result and progress note 09/12/2019 stating, "her 2D echocardiogram was unrevealing with normal LV systolic function and no significant valvular abnormalities. Her RV systolic pressure was moderately increased but I suspect this is related to all long history of tobacco abuse. Her lower extremity arterial Doppler studies performed 09/05/2019 revealed normal ABIs bilaterally. Her LDL was 112, acceptable for primary prevention. I will see her back as needed."  Cardiac CT 08/09/2019 showed calcium score of 0, no evidence of CAD.  Patient is on Plavix prescribed to her PCP reportedly for PAD.  Last dose 05/04/2022.  Preop labs reviewed, unremarkable.  EKG 04/30/2022: Normal sinus rhythm.  Rate 73. Possible Left atrial enlargement. Nonspecific T wave abnormality  TTE 08/25/2019:  1. Left ventricular ejection fraction, by estimation, is 60 to 65%. The  left ventricle has normal function. The left ventricle has no regional  wall motion abnormalities. Left ventricular diastolic parameters were  normal. The average left ventricular  global longitudinal strain is -22.4 %. The global longitudinal strain is  normal.   2. Right ventricular systolic function is normal. The right ventricular  size is normal. There is moderately elevated pulmonary artery systolic  pressure. The estimated right ventricular systolic pressure is [id number] mmHg.   3. The mitral valve is normal in structure. No evidence of mitral valve  regurgitation. No evidence of mitral stenosis.   4. The aortic valve is normal in structure. Aortic valve regurgitation is  not visualized. No  aortic stenosis is present.   5. The inferior vena cava is normal in size with <50% respiratory  variability, suggesting right atrial pressure of 8 mmHg.   CT cardiac scoring 08/09/2019: IMPRESSION: Coronary calcium score of 0.    Karoline Caldwell, PA-C [facility] Short Stay Center/Anesthesiology Phone [phone] 05/01/2022 1:01 PM

## 2022-05-01 NOTE — Anesthesia Preprocedure Evaluation (Addendum)
Anesthesia Evaluation  Patient identified by MRN, date of birth, ID band Patient awake    Reviewed: Allergy & Precautions, NPO status , Patient's Chart, lab work & pertinent test results  Airway Mallampati: II  TM Distance: >3 FB Neck ROM: Full    Dental   Pulmonary asthma , sleep apnea , COPD, Patient abstained from smoking., former smoker   breath sounds clear to auscultation       Cardiovascular hypertension, Pt. on medications + Peripheral Vascular Disease   Rhythm:Regular Rate:Normal     Neuro/Psych  Neuromuscular disease    GI/Hepatic Neg liver ROS,GERD  ,,  Endo/Other  diabetes, Type 2, Oral Hypoglycemic Agents    Renal/GU negative Renal ROS     Musculoskeletal   Abdominal   Peds  Hematology negative hematology ROS (+)   Anesthesia Other Findings   Reproductive/Obstetrics                             Anesthesia Physical Anesthesia Plan  ASA: 3  Anesthesia Plan: General   Post-op Pain Management: Tylenol PO (pre-op)*, Regional block* and Toradol IV (intra-op)*   Induction: Intravenous  PONV Risk Score and Plan: 3 and Dexamethasone, Ondansetron and Treatment may vary due to age or medical condition  Airway Management Planned: LMA  Additional Equipment: None  Intra-op Plan:   Post-operative Plan: Extubation in OR  Informed Consent: I have reviewed the patients History and Physical, chart, labs and discussed the procedure including the risks, benefits and alternatives for the proposed anesthesia with the patient or authorized representative who has indicated his/her understanding and acceptance.     Dental advisory given  Plan Discussed with: CRNA  Anesthesia Plan Comments: (PAT note by Antionette Poles, PA-C:  62 year old female with history of OSA on CPAP, non-insulin-dependent DM2, HTN, former smoker with associated COPD maintained on Spiriva and Symbicort, GERD,  asthma.  Patient was seen by cardiologist Dr. Allyson Sabal on 08/02/2019 for evaluation of DOE and PAD.  Echo, coronary calcium score, and lower extremity Dopplers ordered.  Dr. Allyson Sabal commented on result and progress note 09/12/2019 stating, "her 2D echocardiogram was unrevealing with normal LV systolic function and no significant valvular abnormalities. Her RV systolic pressure was moderately increased but I suspect this is related to all long history of tobacco abuse. Her lower extremity arterial Doppler studies performed 09/05/2019 revealed normal ABIs bilaterally. Her LDL was 112, acceptable for primary prevention. I will see her back as needed."  Cardiac CT 08/09/2019 showed calcium score of 0, no evidence of CAD.  Patient is on Plavix prescribed to her PCP reportedly for PAD.  Last dose 05/04/2022.  Preop labs reviewed, unremarkable.  EKG 04/30/2022: Normal sinus rhythm.  Rate 73. Possible Left atrial enlargement. Nonspecific T wave abnormality  TTE 08/25/2019: 1. Left ventricular ejection fraction, by estimation, is 60 to 65%. The  left ventricle has normal function. The left ventricle has no regional  wall motion abnormalities. Left ventricular diastolic parameters were  normal. The average left ventricular  global longitudinal strain is -22.4 %. The global longitudinal strain is  normal.  2. Right ventricular systolic function is normal. The right ventricular  size is normal. There is moderately elevated pulmonary artery systolic  pressure. The estimated right ventricular systolic pressure is 43.8 mmHg.  3. The mitral valve is normal in structure. No evidence of mitral valve  regurgitation. No evidence of mitral stenosis.  4. The aortic valve is normal in structure. Aortic  valve regurgitation is  not visualized. No aortic stenosis is present.  5. The inferior vena cava is normal in size with <50% respiratory  variability, suggesting right atrial pressure of 8 mmHg.   CT cardiac scoring  08/09/2019: IMPRESSION: Coronary calcium score of 0.  )        Anesthesia Quick Evaluation

## 2022-05-04 DIAGNOSIS — R32 Unspecified urinary incontinence: Secondary | ICD-10-CM | POA: Diagnosis not present

## 2022-05-04 DIAGNOSIS — I10 Essential (primary) hypertension: Secondary | ICD-10-CM | POA: Diagnosis not present

## 2022-05-04 NOTE — Telephone Encounter (Signed)
Called and advised. Pt stated understanding  °

## 2022-05-06 DIAGNOSIS — F331 Major depressive disorder, recurrent, moderate: Secondary | ICD-10-CM | POA: Diagnosis not present

## 2022-05-07 ENCOUNTER — Telehealth: Payer: Self-pay | Admitting: Orthopaedic Surgery

## 2022-05-07 ENCOUNTER — Encounter: Payer: Self-pay | Admitting: Internal Medicine

## 2022-05-07 ENCOUNTER — Ambulatory Visit: Payer: Medicaid Other | Attending: Internal Medicine | Admitting: Internal Medicine

## 2022-05-07 ENCOUNTER — Ambulatory Visit
Admission: RE | Admit: 2022-05-07 | Discharge: 2022-05-07 | Disposition: A | Discharge status: Home / Self Care | Payer: Medicaid Other | Source: Ambulatory Visit | Attending: Internal Medicine | Admitting: Internal Medicine

## 2022-05-07 VITALS — BP 121/79 | HR 91 | Temp 98.4°F | Ht 66.0 in | Wt 158.0 lb

## 2022-05-07 DIAGNOSIS — R918 Other nonspecific abnormal finding of lung field: Secondary | ICD-10-CM | POA: Diagnosis not present

## 2022-05-07 DIAGNOSIS — R197 Diarrhea, unspecified: Secondary | ICD-10-CM | POA: Diagnosis not present

## 2022-05-07 DIAGNOSIS — J432 Centrilobular emphysema: Secondary | ICD-10-CM | POA: Diagnosis not present

## 2022-05-07 DIAGNOSIS — M79671 Pain in right foot: Secondary | ICD-10-CM

## 2022-05-07 DIAGNOSIS — E1142 Type 2 diabetes mellitus with diabetic polyneuropathy: Secondary | ICD-10-CM | POA: Diagnosis not present

## 2022-05-07 DIAGNOSIS — I152 Hypertension secondary to endocrine disorders: Secondary | ICD-10-CM | POA: Diagnosis not present

## 2022-05-07 DIAGNOSIS — G4733 Obstructive sleep apnea (adult) (pediatric): Secondary | ICD-10-CM | POA: Diagnosis not present

## 2022-05-07 DIAGNOSIS — E1159 Type 2 diabetes mellitus with other circulatory complications: Secondary | ICD-10-CM

## 2022-05-07 DIAGNOSIS — M7731 Calcaneal spur, right foot: Secondary | ICD-10-CM | POA: Diagnosis not present

## 2022-05-07 DIAGNOSIS — Z01818 Encounter for other preprocedural examination: Secondary | ICD-10-CM

## 2022-05-07 MED ORDER — DAPAGLIFLOZIN PROPANEDIOL 5 MG PO TABS: 5.0000 mg | ORAL_TABLET | Freq: Every day | ORAL | 1 refills | Status: DC

## 2022-05-07 MED ORDER — METFORMIN HCL ER 750 MG PO TB24: 750.0000 mg | ORAL_TABLET | Freq: Every day | ORAL | 1 refills | Status: DC

## 2022-05-07 MED ORDER — COLCHICINE 0.6 MG PO TABS: ORAL_TABLET | ORAL | 0 refills | Status: DC

## 2022-05-07 NOTE — Progress Notes (Signed)
Patient ID: Stacy [name] [facility], female    DOB: August 31, 1960  MRN: [id number]  CC: Diabetes (Diabetes f/u. Med refills./Pain on R heel due to gout. Janene Harvey on R knee - upcoming surgery on tuesday)   Subjective: Stacy [name] is a 62 y.o. female who presents for chronic ds management and pre-op eval Her concerns today include:  hx of HTN, DM with neuropathy, aortic atherosclerosis, tob dep, HL, PAD, IDA, lupus, spinal stenosis with neurogenic claudication (s/p laminectomy 07/2018), COPD, OSA on CPAP,LS, OA knees, gout, recurrent sinusitis, receiving allergy shots from Dr. Faith Rogue.     C/o pain in RT heel x 1 wk; thinks it is flareup of gout.  Thinks she needs some more colchicine, very sensative to touch even bed sheets.  She has not had any injury to the foot.  She will be having revision of right knee prosthesis from prior knee replacement.  Hardware is loose.  Procedure will be done by Dr. Ninfa Linden on 05/12/2022.  She has not had any issues with anesthesia in the past.  Patient states she was told to stop her Plavix from last week.  DM: Lab Results  Component Value Date   HGBA1C 6.6 (H) 04/30/2022  Currently on metformin 750 mg twice a day. Does well with eating habits.  Checks blood sugars twice a day and reports readings have been good.  No low blood sugar episodes. She has been having some intermittent diarrhea last month from March 4th-10 th and again last week.  She thinks it was IBS flare.  Denies any bloating, abdominal pain, fever, blood in the stools.  No diarrhea this week.  HTN: Compliant with Diovan/HCTZ.  She limits salt in the foods. No chest pains or shortness of breath. COPD stable on Spiriva and Symbicort inhalers.  She has not had any increased cough, wheezing or shortness of breath lately. Compliant with using her CPAP for OSA.  HL: Taking and tolerating atorvastatin 40 mg daily.  She was supposed to have CT scan of the chest for follow-up on lung nodules.   It was denied by her insurance because it was a contrast study.  Patient discontinued smoking 07/2021.  Patient Active Problem List   Diagnosis Date Noted   Personal history of colonic polyps 03/20/2022   Failed total knee, right, subsequent encounter 03/02/2022   Loose right total knee arthroplasty 03/02/2022   Mixed stress and urge urinary incontinence 12/04/2021   Functional fecal incontinence 12/04/2021   IBS (irritable bowel syndrome) 11/19/2021   Spondylolisthesis, lumbar region    Other spondylosis with radiculopathy, lumbar region    Other secondary scoliosis, lumbar region    Fusion of spine of lumbar region 03/25/2021   Colon polyps 06/06/2020   Lupus 06/06/2020   Allergic rhinitis due to animal (cat) (dog) hair and dander 04/08/2020   Allergic rhinitis due to pollen 04/08/2020   Food allergy 04/08/2020   Acute medial meniscus tear, left, subsequent encounter 03/21/2020   Chronic pain of left knee 02/15/2020   Paresthesia of skin 11/16/2019   History of total knee replacement, right 11/16/2019   Tobacco abuse 11/16/2019   Centrilobular emphysema 08/10/2019   Incidental lung nodule, > 12mm and < 61mm 08/10/2019   OSA on CPAP 08/10/2019   Dyspnea on exertion 08/02/2019   Hyperlipidemia 08/02/2019   Lumbar radiculopathy 04/24/2019   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   Post laminectomy syndrome 02/23/2019   Abnormality of gait 02/23/2019   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  Unilateral primary osteoarthritis, left hip 12/28/2018   Lesion of skin of left ear 12/26/2018   Primary osteoarthritis of left hip 12/02/2018   Iron deficiency anemia 10/16/2018   Chronic pain syndrome 09/08/2018   Chronic pain of right knee 08/11/2018   Status post lumbar laminectomy 07/15/2018   Peripheral arterial disease 04/05/2018   Moderate persistent asthma without complication [id number]   Environmental and seasonal allergies 06/29/2017   Controlled type 2 diabetes mellitus with  diabetic polyneuropathy, without long-term current use of insulin 06/29/2017   Perennial allergic rhinitis 04/08/2017   Sensorineural hearing loss (SNHL), bilateral 04/08/2017   Chronic pansinusitis 03/25/2017   Eustachian tube dysfunction, bilateral 0000000   Lichen planopilaris [id number]   Herniation of lumbar intervertebral disc with radiculopathy 10/02/2016    Class: Chronic   Alopecia areata 08/19/2016   Chondromalacia of both patellae 06/03/2015    Class: Chronic   Spinal stenosis, lumbar region, with neurogenic claudication 06/03/2015   Tobacco use disorder 04/25/2015   DJD (degenerative joint disease) of knee 01/04/2015   Hemorrhoid 11/14/2014   Gout of big toe 07/19/2014   Essential hypertension 08/14/2013   Gastroesophageal reflux disease without esophagitis 08/14/2013   COPD (chronic obstructive pulmonary disease) 04/17/2011     Current Outpatient Medications on File Prior to Visit  Medication Sig Dispense Refill   albuterol (PROVENTIL) (2.5 MG/3ML) 0.083% nebulizer solution USE ONE VIAL (2.5 MG TOTAL) BY NEBULIZATION EVERY 6 (SIX) HOURS AS NEEDED FOR WHEEZING. 360 mL 0   allopurinol (ZYLOPRIM) 100 MG tablet TAKE 1 TABLET (100 MG TOTAL) BY MOUTH DAILY.(AM) 90 tablet 3   atorvastatin (LIPITOR) 40 MG tablet TAKE 1 TABLET (40 MG TOTAL) BY MOUTH DAILY. (BEDTIME) 90 tablet 0   Blood Glucose Monitoring Suppl (ACCU-CHEK AVIVA PLUS) w/Device KIT 1 each by Does not apply route 3 (three) times daily. 1 kit 0   celecoxib (CELEBREX) 200 MG capsule Take 200 mg by mouth 2 (two) times daily.     diclofenac Sodium (VOLTAREN) 1 % GEL Apply 2 g topically 4 (four) times daily. (Patient taking differently: Apply 2 g topically 4 (four) times daily as needed (joint pain).) 350 g 5   DULoxetine (CYMBALTA) 60 MG capsule TAKE 1 CAPSULE (60 MG TOTAL) BY MOUTH DAILY (AM) 30 capsule 6   EPINEPHrine 0.3 mg/0.3 mL IJ SOAJ injection 0.3 mg IM x 1 PRN for allergic reaction 1 Device 1   ferrous sulfate  ([name]) 325 (65 FE) MG tablet TAKE ONE TABLET ( 325 MG ) BY MOUTH DAILY (AM) 100 tablet 0   fluticasone (FLONASE) 50 MCG/ACT nasal spray Place 1 spray into both nostrils daily. 16 g 6   Glucagon 1 MG/0.2ML SOSY 1 mg arrange subcutaneously as needed for severe symptomatic low blood sugar 0.2 mL 0   glucose blood (ACCU-CHEK AVIVA PLUS) test strip Use as instructed 100 each 12   glycopyrrolate (ROBINUL) 2 MG tablet Take 2 mg by mouth 3 (three) times daily.     hydrOXYzine (ATARAX) 10 MG tablet TAKE 1 TABLET IN THE MORNING AND NOON AND 2 TABLETS IN THE EVENING AS NEEDED. (Patient taking differently: Take 10 mg by mouth in the morning and at bedtime.) 120 tablet 0   Lancets (ACCU-CHEK MULTICLIX) lancets Use as instructed 100 each 12   Lancets (ACCU-CHEK SOFT TOUCH) lancets Use as instructed 100 each 12   loratadine (CLARITIN) 10 MG tablet TAKE 1 TABLET (10 MG TOTAL) BY MOUTH DAILY. (AM) 90 tablet 0   melatonin 5 MG TABS Take  5 mg by mouth at bedtime.     metFORMIN (GLUCOPHAGE-XR) 750 MG 24 hr tablet TAKE 1 TABLET (750 MG TOTAL) BY MOUTH 2 (TWO) TIMES DAILY (AM+BEDTIME) 180 tablet 0   montelukast (SINGULAIR) 10 MG tablet Take 10 mg by mouth daily.     Multiple Vitamins-Minerals (CENTRUM SILVER 50+WOMEN PO) Take 1 tablet by mouth daily.     pantoprazole (PROTONIX) 40 MG tablet Take 40 mg by mouth daily.     potassium chloride SA (KLOR-CON M) 20 MEQ tablet TAKE ONE TABLET BY MOUTH ONCE DAILY (NOON) 90 tablet 0   Respiratory Therapy Supplies (FLUTTER) DEVI Use after breathing treatment 4 times daily 1 each 0   SYMBICORT 160-4.5 MCG/ACT inhaler Inhale 1 puff into the lungs 2 (two) times daily.     tiotropium (SPIRIVA HANDIHALER) 18 MCG inhalation capsule PLACE 1 CAPSULE (18 MCG TOTAL) INTO INHALER AND INHALE DAILY. 30 capsule 1   triamcinolone cream (KENALOG) 0.1 % Apply 1 application topically 2 (two) times daily. (Patient taking differently: Apply 1 application  topically 2 (two) times daily as needed  (irritation).) 30 g 0   valsartan-hydrochlorothiazide (DIOVAN-HCT) 160-12.5 MG tablet TAKE ONE TABLET BY MOUTH ONCE DAILY (AM) 90 tablet 0   clopidogrel (PLAVIX) 75 MG tablet TAKE 1 TABLET (75 MG TOTAL) BY MOUTH DAILY. (AM) (Patient not taking: Reported on 05/07/2022) 30 tablet 5   [DISCONTINUED] Fluticasone-Salmeterol (ADVAIR) 500-50 MCG/DOSE AEPB Inhale 1 puff into the lungs every 12 (twelve) hours.       [DISCONTINUED] lisinopril (PRINIVIL,ZESTRIL) 40 MG tablet Take 40 mg by mouth daily.       Current Facility-Administered Medications on File Prior to Visit  Medication Dose Route Frequency Provider Last Rate Last Admin   glucose chewable tablet 12 g  3 tablet Oral Once [name], MD        Allergies  Allergen Reactions   Other Shortness Of Breath    UNSPECIFIED AGENTS Allergic to perfumes and cleaning products   Shellfish Allergy Anaphylaxis    Per allergy test.   Ace Inhibitors Cough and Other (See Comments)        Celebrex [Celecoxib]      upset stomach  Pt is taking med*   Aspirin Nausea Only    stomach upset     Social History   Socioeconomic History   Marital status: Married    Spouse name: Not on file   Number of children: 2   Years of education: Not on file   Highest education level: Not on file  Occupational History   Occupation: unemployed    Employer: UNEMPLOYED  Tobacco Use   Smoking status: Former   Smokeless tobacco: Never  Scientific laboratory technician Use: Never used  Substance and Sexual Activity   Alcohol use: No   Drug use: No   Sexual activity: Yes    Birth control/protection: Surgical, Post-menopausal    Comment: tubal ligation  Other Topics Concern   Not on file  Social History Narrative   Left Handed    Lives in a two story apartment. With husband   Drinks Caffeine 1 cup   retired   Investment banker, operational of Radio broadcast assistant Strain: Not on file  Food Insecurity: No Food Insecurity (03/09/2022)   Hunger Vital Sign    Worried  About Running Out of Food in the Last Year: Never true    Ran Out of Food in the Last Year: Never true  Transportation Needs: No Transportation  Needs (03/09/2022)   PRAPARE - Hydrologist (Medical): No    Lack of Transportation (Non-Medical): No  Physical Activity: Not on file  Stress: Not on file  Social Connections: Not on file  Intimate Partner Violence: Not on file    Family History  Problem Relation Age of Onset   Hypertension Father    Cancer Father    Heart disease Mother    Asthma Son        had as a child   Heart disease Sister    Breast cancer Sister    Hypertension Brother     Past Surgical History:  Procedure Laterality Date   BACK SURGERY     feb 21, 23   CHOLECYSTECTOMY     COLONOSCOPY     ENDOMETRIAL ABLATION  10/2010   HERNIA REPAIR     umbicial hernia   JOINT REPLACEMENT Left 03/14/2019   Dr. Ninfa Linden hip   KNEE ARTHROSCOPY Left    06/07/2017 Dr. Marlou Sa of [place] ARTHROSCOPY Left 03/21/2020   Procedure: LEFT KNEE ARTHROSCOPY WITH PARTIAL MEDIAL MENISCECTOMY;  Surgeon: Mcarthur Rossetti, MD;  Location: [place];  Service: Orthopedics;  Laterality: Left;   KNEE CLOSED REDUCTION Right 12/06/2015   Procedure: CLOSED MANIPULATION RIGHT KNEE;  Surgeon: [name], MD;  Location: [place];  Service: Orthopedics;  Laterality: Right;   KNEE CLOSED REDUCTION Right 01/17/2016   Procedure: CLOSED MANIPULATION RIGHT KNEE;  Surgeon: [name], MD;  Location: [employer] [facility];  Service: Orthopedics;  Laterality: Right;   KNEE JOINT MANIPULATION Right 12/06/2015   LACRIMAL TUBE INSERTION Bilateral 03/01/2019   Procedure: LACRIMAL TUBE INSERTION;  Surgeon: Gevena Cotton, MD;  Location: [facility];  Service: Ophthalmology;  Laterality: Bilateral;   LACRIMAL TUBE REMOVAL Bilateral 05/03/2019   Procedure: BILATERAL NASOLACRIMAL DUCT PROBING, IIRIGATION AND TUBE REMOVAL BOTH EYES;  Surgeon: Gevena Cotton, MD;  Location: [place];  Service: Ophthalmology;  Laterality: Bilateral;   LUMBAR LAMINECTOMY/DECOMPRESSION MICRODISCECTOMY N/A 06/03/2015   Procedure: Bilateral lateral recess decompression L2-3, L3-4, L4-5;  Surgeon: [name], MD;  Location: [place];  Service: Orthopedics;  Laterality: N/A;   LUMBAR LAMINECTOMY/DECOMPRESSION MICRODISCECTOMY N/A 10/02/2016   Procedure: Right L5-S1 Lateral Recess Decompression  microdiscectomy;  Surgeon: [name], MD;  Location: [place];  Service: Orthopedics;  Laterality: N/A;   LUMBAR LAMINECTOMY/DECOMPRESSION MICRODISCECTOMY N/A 07/15/2018   Procedure: LEFT L3-4 MICRODISCECTOMY;  Surgeon: [name], MD;  Location: [place];  Service: Orthopedics;  Laterality: N/A;   svd      x 2   TEAR DUCT PROBING Bilateral 03/01/2019   Procedure: TEAR DUCT PROBING WITH IRRIGATION;  Surgeon: Gevena Cotton, MD;  Location: [facility];  Service: Ophthalmology;  Laterality: Bilateral;   TOTAL HIP ARTHROPLASTY Left 03/14/2019   Procedure: LEFT TOTAL HIP ARTHROPLASTY ANTERIOR APPROACH;  Surgeon: Mcarthur Rossetti, MD;  Location: [place];  Service: Orthopedics;  Laterality: Left;   TOTAL KNEE ARTHROPLASTY Right 09/06/2015   Procedure: RIGHT TOTAL KNEE ARTHROPLASTY;  Surgeon: [name], MD;  Location: [place];  Service: Orthopedics;  Laterality: Right;   TUBAL LIGATION     UPPER GASTROINTESTINAL ENDOSCOPY  04/28/2011    ROS: Review of Systems Negative except as stated above  PHYSICAL EXAM: BP 121/79 (BP Location: Left Arm, Patient Position: Sitting, Cuff Size: Normal)   Pulse 91   Temp 98.4 F (36.9 C) (Oral)  Ht 5\' 6"  (1.676 m)   Wt 158 lb (71.7 kg)   LMP 09/01/2010   SpO2 98%   BMI 25.50 kg/m   Physical Exam  General appearance - alert, well appearing, older African-American female and in no distress. Mental status - normal mood, behavior, speech, dress, motor activity, and thought processes Mouth -  mucous membranes moist, pharynx normal without lesions Neck - supple, no significant adenopathy Chest - clear to auscultation, no wheezes, rales or rhonchi, symmetric air entry Heart - normal rate, regular rhythm, normal S1, S2, no murmurs, rubs, clicks or gallops Musculoskeletal -she is wearing a Velcro brace on the right knee.  She has her walker with her. Right heel: No edema or erythema noted.  Area is exquisitely tender to touch.  No fluctuance appreciated. Extremities -no lower extremity edema  I reviewed recent EKG that she had done as part of her preoperative evaluation Patient had CT cardiac 08/2019 with coronary score of 0. Echo done 08/25/2019 showed normal ejection fraction of 60 to 65% with no regional wall motion abnormalities.    Latest Ref Rng & Units 04/30/2022    9:09 AM 01/05/2022   12:15 PM 03/26/2021    3:11 PM  CMP  Glucose 70 - 99 mg/dL 106  85  162   BUN 8 - 23 mg/dL 7  10  11    Creatinine 0.44 - 1.00 mg/dL 0.83  0.86  1.06   Sodium 135 - 145 mmol/L 137  140  132   Potassium 3.5 - 5.1 mmol/L 3.5  4.4  3.8   Chloride 98 - 111 mmol/L 97  100  95   CO2 22 - 32 mmol/L 28  26  28    Calcium 8.9 - 10.3 mg/dL 9.8  10.1  8.7   Total Protein 6.0 - 8.5 g/dL  7.7    Total Bilirubin 0.0 - 1.2 mg/dL  0.2    Alkaline Phos 44 - 121 IU/L  114    AST 0 - 40 IU/L  17    ALT 0 - 32 IU/L  11     Lipid Panel     Component Value Date/Time   CHOL 132 01/05/2022 1215   TRIG 116 01/05/2022 1215   HDL 47 01/05/2022 1215   CHOLHDL 2.8 01/05/2022 1215   CHOLHDL 4.2 07/19/2014 1202   VLDL 24 07/19/2014 1202   LDLCALC 64 01/05/2022 1215    CBC    Component Value Date/Time   WBC 9.0 04/30/2022 0909   RBC 4.74 04/30/2022 0909   HGB 12.9 04/30/2022 0909   HGB 11.3 05/01/2021 1526   HCT 38.6 04/30/2022 0909   HCT 34.7 05/01/2021 1526   PLT 338 04/30/2022 0909   PLT 297 05/01/2021 1526   MCV 81.4 04/30/2022 0909   MCV 81 05/01/2021 1526   MCH 27.2 04/30/2022 0909   MCHC 33.4  04/30/2022 0909   RDW 14.8 04/30/2022 0909   RDW 15.7 (H) 05/01/2021 1526   LYMPHSABS 2,993 01/01/2022 1644   LYMPHSABS 4.1 (H) 10/14/2018 1704   MONOABS 0.7 03/28/2021 1116   EOSABS 74 01/01/2022 1644   EOSABS 0.1 10/14/2018 1704   BASOSABS 33 01/01/2022 1644   BASOSABS 0.0 10/14/2018 1704    ASSESSMENT AND PLAN:  1. Preoperative evaluation to rule out surgical contraindication Except for acute pain in the right ankle which may or may not be gout, patient is medically maximized for her procedure.  2. Type 2 diabetes mellitus with peripheral neuropathy At goal.  Given intermittent diarrhea, I recommend decreasing metformin to just 1 tablet daily.  We will add low-dose of Farxiga instead.  Last BMP showed GFR greater than 60. - metFORMIN (GLUCOPHAGE-XR) 750 MG 24 hr tablet; Take 1 tablet (750 mg total) by mouth daily with breakfast.  Dispense: 90 tablet; Refill: 1 - dapagliflozin propanediol (FARXIGA) 5 MG TABS tablet; Take 1 tablet (5 mg total) by mouth daily before breakfast.  Dispense: 90 tablet; Refill: 1  3. Hypertension associated with diabetes At goal.  Continue Diovan/HCTZ.  4. Centrilobular emphysema Controlled on Spiriva and Symbicort.  5. Pain of right heel Questionably acute gout.  We will get an x-ray of the right heel.  I have given some colchicine to use in the meantime.  Advised to let me know via MyChart in a few days if symptoms have not resolved - colchicine 0.6 MG tablet; 2 tabs PO today then 1 tab PO daily x 4 days  Dispense: 6 tablet; Refill: 0 - DG Foot Complete Right; Future  6. Lung nodules We will change to CT scan without contrast. - CT Chest Wo Contrast; Future  7. Intermittent diarrhea See plan under #2.  8. OSA on CPAP Encouraged her to continue using her CPAP.    Patient was given the opportunity to ask questions.  Patient verbalized understanding of the plan and was able to repeat key elements of the plan.   This documentation was  completed using Radio producer.  Any transcriptional errors are unintentional.  No orders of the defined types were placed in this encounter.    Requested Prescriptions    No prescriptions requested or ordered in this encounter    No follow-ups on file.  Karle Plumber, MD, FACP

## 2022-05-07 NOTE — Telephone Encounter (Signed)
Faxed to [facility] 

## 2022-05-07 NOTE — Telephone Encounter (Signed)
Larene Beach (Rep) from [facility] care called stating they had to switched vendor pt pt to get hospital mattress. Please fax script for hospital mattress to [facility]. [facility] phone number is [phone]

## 2022-05-07 NOTE — Patient Instructions (Signed)
Decrease metformin to 750 mg once a day.  We have added another diabetes medicine called Farxiga 5 mg daily instead.  This will cause you to urinate a little bit more.  I have sent prescription to your pharmacy for colchicine to use for several days. Please go to [facility] imaging at [facility]. to get x-ray done of the right foot.

## 2022-05-11 NOTE — H&P (Signed)
TOTAL KNEE REVISION ADMISSION H&P  Patient is being admitted for right revision total knee arthroplasty.  Subjective:  Chief Complaint:right knee pain.  HPI: Stacy [name], 62 y.o. female, has a history of pain and functional disability in the right knee(s) due to failed previous arthroplasty and patient has failed non-surgical conservative treatments for greater than 12 weeks to include NSAID's and/or analgesics, use of assistive devices, and activity modification. The indications for the revision of the total knee arthroplasty are loosening of one or more components. Onset of symptoms was gradual starting 3 years ago with gradually worsening course since that time.  Prior procedures on the right knee(s) include arthroplasty.  Patient currently rates pain in the right knee(s) at 7 out of 10 with activity. There is worsening of pain with activity and weight bearing, pain that interferes with activities of daily living, pain with passive range of motion, and joint swelling.  Patient has evidence of prosthetic loosening by imaging studies. This condition presents safety issues increasing the risk of falls.  There is no current active infection.  Patient Active Problem List   Diagnosis Date Noted   Personal history of colonic polyps 03/20/2022   Failed total knee, right, subsequent encounter 03/02/2022   Loose right total knee arthroplasty 03/02/2022   Mixed stress and urge urinary incontinence 12/04/2021   Functional fecal incontinence 12/04/2021   IBS (irritable bowel syndrome) 11/19/2021   Spondylolisthesis, lumbar region    Other spondylosis with radiculopathy, lumbar region    Other secondary scoliosis, lumbar region    Fusion of spine of lumbar region 03/25/2021   Colon polyps 06/06/2020   Lupus 06/06/2020   Allergic rhinitis due to animal (cat) (dog) hair and dander 04/08/2020   Allergic rhinitis due to pollen 04/08/2020   Food allergy 04/08/2020   Acute medial meniscus tear,  left, subsequent encounter 03/21/2020   Chronic pain of left knee 02/15/2020   Paresthesia of skin 11/16/2019   History of total knee replacement, right 11/16/2019   Tobacco abuse 11/16/2019   Centrilobular emphysema 08/10/2019   Incidental lung nodule, > 87mm and < 65mm 08/10/2019   OSA on CPAP 08/10/2019   Dyspnea on exertion 08/02/2019   Hyperlipidemia 08/02/2019   Lumbar radiculopathy 04/24/2019   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   Post laminectomy syndrome 02/23/2019   Abnormality of gait 02/23/2019   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   Unilateral primary osteoarthritis, left hip 12/28/2018   Lesion of skin of left ear 12/26/2018   Primary osteoarthritis of left hip 12/02/2018   Iron deficiency anemia 10/16/2018   Chronic pain syndrome 09/08/2018   Chronic pain of right knee 08/11/2018   Status post lumbar laminectomy 07/15/2018   Peripheral arterial disease 04/05/2018   Moderate persistent asthma without complication 06/29/2017   Environmental and seasonal allergies 06/29/2017   Controlled type 2 diabetes mellitus with diabetic polyneuropathy, without long-term current use of insulin 06/29/2017   Perennial allergic rhinitis 04/08/2017   Sensorineural hearing loss (SNHL), bilateral 04/08/2017   Chronic pansinusitis 03/25/2017   Eustachian tube dysfunction, bilateral 03/25/2017   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   Herniation of lumbar intervertebral disc with radiculopathy 10/02/2016   Alopecia areata 08/19/2016   Chondromalacia of both patellae 06/03/2015   Spinal stenosis, lumbar region, with neurogenic claudication 06/03/2015   Tobacco use disorder 04/25/2015   DJD (degenerative joint disease) of knee 01/04/2015   Hemorrhoid 11/14/2014   Gout of big toe 07/19/2014   Essential hypertension 08/14/2013   Gastroesophageal reflux  disease without esophagitis 08/14/2013   COPD (chronic obstructive pulmonary disease) 04/17/2011   Past Medical History:  Diagnosis Date    Allergy    Shellfish, cleaning products   Anxiety    Arthritis    Arthrofibrosis of total knee replacement    right   Asthma    COPD (chronic obstructive pulmonary disease)    Depression    Diabetes mellitus    Type II   GERD (gastroesophageal reflux disease)    Pt on Protonix daily   Glaucoma    Gout    Headache(784.0)    otc meds prn   Hyperlipidemia    Hypertension    Irritable bowel syndrome 11/19/2010   Neuropathy    Pneumonia YRS AGO   Restless legs    Shortness of breath    07/14/2018- uses  4 times a day   Sleep apnea     Past Surgical History:  Procedure Laterality Date   BACK SURGERY     feb 21, 23   CHOLECYSTECTOMY     COLONOSCOPY     ENDOMETRIAL ABLATION  10/2010   HERNIA REPAIR     umbicial hernia   JOINT REPLACEMENT Left 03/14/2019   Dr. Magnus Ivan hip   KNEE ARTHROSCOPY Left    06/07/2017 Dr. August [facility]   KNEE ARTHROSCOPY Left 03/21/2020   Procedure: LEFT KNEE ARTHROSCOPY WITH PARTIAL MEDIAL MENISCECTOMY;  Surgeon: [name], MD;  Location: [facility];  Service: Orthopedics;  Laterality: Left;   KNEE CLOSED REDUCTION Right 12/06/2015   Procedure: CLOSED MANIPULATION RIGHT KNEE;  Surgeon: Kerrin Champagne, MD;  Location: [facility];  Service: Orthopedics;  Laterality: Right;   KNEE CLOSED REDUCTION Right 01/17/2016   Procedure: CLOSED MANIPULATION RIGHT KNEE;  Surgeon: Kerrin Champagne, MD;  Location: [facility];  Service: Orthopedics;  Laterality: Right;   KNEE JOINT MANIPULATION Right 12/06/2015   LACRIMAL TUBE INSERTION Bilateral 03/01/2019   Procedure: LACRIMAL TUBE INSERTION;  Surgeon: Aura Camps, MD;  Location: [facility];  Service: Ophthalmology;  Laterality: Bilateral;   LACRIMAL TUBE REMOVAL Bilateral 05/03/2019   Procedure: BILATERAL NASOLACRIMAL DUCT PROBING, IIRIGATION AND TUBE REMOVAL BOTH EYES;  Surgeon: Aura Camps, MD;  Location: [facility];  Service: Ophthalmology;   Laterality: Bilateral;   LUMBAR LAMINECTOMY/DECOMPRESSION MICRODISCECTOMY N/A 06/03/2015   Procedure: Bilateral lateral recess decompression L2-3, L3-4, L4-5;  Surgeon: Kerrin Champagne, MD;  Location: [facility];  Service: Orthopedics;  Laterality: N/A;   LUMBAR LAMINECTOMY/DECOMPRESSION MICRODISCECTOMY N/A 10/02/2016   Procedure: Right L5-S1 Lateral Recess Decompression  microdiscectomy;  Surgeon: Kerrin Champagne, MD;  Location: [facility] OR;  Service: Orthopedics;  Laterality: N/A;   LUMBAR LAMINECTOMY/DECOMPRESSION MICRODISCECTOMY N/A 07/15/2018   Procedure: LEFT L3-4 MICRODISCECTOMY;  Surgeon: Kerrin Champagne, MD;  Location: [facility] OR;  Service: Orthopedics;  Laterality: N/A;   svd      x 2   TEAR DUCT PROBING Bilateral 03/01/2019   Procedure: TEAR DUCT PROBING WITH IRRIGATION;  Surgeon: Aura Camps, MD;  Location: [facility];  Service: Ophthalmology;  Laterality: Bilateral;   TOTAL HIP ARTHROPLASTY Left 03/14/2019   Procedure: LEFT TOTAL HIP ARTHROPLASTY ANTERIOR APPROACH;  Surgeon: [name], MD;  Location: [facility];  Service: Orthopedics;  Laterality: Left;   TOTAL KNEE ARTHROPLASTY Right 09/06/2015   Procedure: RIGHT TOTAL KNEE ARTHROPLASTY;  Surgeon: Kerrin Champagne, MD;  Location: [facility];  Service: Orthopedics;  Laterality: Right;   TUBAL LIGATION  UPPER GASTROINTESTINAL ENDOSCOPY  04/28/2011    Current Facility-Administered Medications  Medication Dose Route Frequency Provider Last Rate Last Admin   glucose chewable tablet 12 g  3 tablet Oral Once Marcine MatarJohnson, Deborah B, MD       Current Outpatient Medications  Medication Sig Dispense Refill Last Dose   allopurinol (ZYLOPRIM) 100 MG tablet TAKE 1 TABLET (100 MG TOTAL) BY MOUTH DAILY.(AM) 90 tablet 3    celecoxib (CELEBREX) 200 MG capsule Take 200 mg by mouth 2 (two) times daily.      clopidogrel (PLAVIX) 75 MG tablet TAKE 1 TABLET (75 MG TOTAL) BY MOUTH DAILY. (AM) (Patient not taking: Reported on 05/07/2022) 30 tablet 5     diclofenac Sodium (VOLTAREN) 1 % GEL Apply 2 g topically 4 (four) times daily. (Patient taking differently: Apply 2 g topically 4 (four) times daily as needed (joint pain).) 350 g 5    EPINEPHrine 0.3 mg/0.3 mL IJ SOAJ injection 0.3 mg IM x 1 PRN for allergic reaction 1 Device 1    fluticasone (FLONASE) 50 MCG/ACT nasal spray Place 1 spray into both nostrils daily. 16 g 6    Glucagon 1 MG/0.2ML SOSY 1 mg arrange subcutaneously as needed for severe symptomatic low blood sugar 0.2 mL 0    glycopyrrolate (ROBINUL) 2 MG tablet Take 2 mg by mouth 3 (three) times daily.      hydrOXYzine (ATARAX) 10 MG tablet TAKE 1 TABLET IN THE MORNING AND NOON AND 2 TABLETS IN THE EVENING AS NEEDED. (Patient taking differently: Take 10 mg by mouth in the morning and at bedtime.) 120 tablet 0    loratadine (CLARITIN) 10 MG tablet TAKE 1 TABLET (10 MG TOTAL) BY MOUTH DAILY. (AM) 90 tablet 0    melatonin 5 MG TABS Take 5 mg by mouth at bedtime.      montelukast (SINGULAIR) 10 MG tablet Take 10 mg by mouth daily.      Multiple Vitamins-Minerals (CENTRUM SILVER 50+WOMEN PO) Take 1 tablet by mouth daily.      pantoprazole (PROTONIX) 40 MG tablet Take 40 mg by mouth daily.      SYMBICORT 160-4.5 MCG/ACT inhaler Inhale 1 puff into the lungs 2 (two) times daily.      tiotropium (SPIRIVA HANDIHALER) 18 MCG inhalation capsule PLACE 1 CAPSULE (18 MCG TOTAL) INTO INHALER AND INHALE DAILY. 30 capsule 1    triamcinolone cream (KENALOG) 0.1 % Apply 1 application topically 2 (two) times daily. (Patient taking differently: Apply 1 application  topically 2 (two) times daily as needed (irritation).) 30 g 0    valsartan-hydrochlorothiazide (DIOVAN-HCT) 160-12.5 MG tablet TAKE ONE TABLET BY MOUTH ONCE DAILY (AM) 90 tablet 0    albuterol (PROVENTIL) (2.5 MG/3ML) 0.083% nebulizer solution USE ONE VIAL (2.5 MG TOTAL) BY NEBULIZATION EVERY 6 (SIX) HOURS AS NEEDED FOR WHEEZING. 360 mL 0    atorvastatin (LIPITOR) 40 MG tablet TAKE 1 TABLET (40  MG TOTAL) BY MOUTH DAILY. (BEDTIME) 90 tablet 0    Blood Glucose Monitoring Suppl (ACCU-CHEK AVIVA PLUS) w/Device KIT 1 each by Does not apply route 3 (three) times daily. 1 kit 0    colchicine 0.6 MG tablet 2 tabs PO today then 1 tab PO daily x 4 days 6 tablet 0    dapagliflozin propanediol (FARXIGA) 5 MG TABS tablet Take 1 tablet (5 mg total) by mouth daily before breakfast. 90 tablet 1    DULoxetine (CYMBALTA) 60 MG capsule TAKE 1 CAPSULE (60 MG TOTAL) BY MOUTH DAILY (AM)  30 capsule 6    ferrous sulfate ([name]) 325 (65 FE) MG tablet TAKE ONE TABLET ( 325 MG ) BY MOUTH DAILY (AM) 100 tablet 0    glucose blood (ACCU-CHEK AVIVA PLUS) test strip Use as instructed 100 each 12    Lancets (ACCU-CHEK MULTICLIX) lancets Use as instructed 100 each 12    Lancets (ACCU-CHEK SOFT TOUCH) lancets Use as instructed 100 each 12    metFORMIN (GLUCOPHAGE-XR) 750 MG 24 hr tablet Take 1 tablet (750 mg total) by mouth daily with breakfast. 90 tablet 1    potassium chloride SA (KLOR-CON M) 20 MEQ tablet TAKE ONE TABLET BY MOUTH ONCE DAILY (NOON) 90 tablet 0    Respiratory Therapy Supplies (FLUTTER) DEVI Use after breathing treatment 4 times daily 1 each 0    Allergies  Allergen Reactions   Other Shortness Of Breath    UNSPECIFIED AGENTS Allergic to perfumes and cleaning products   Shellfish Allergy Anaphylaxis    Per allergy test.   Ace Inhibitors Cough and Other (See Comments)        Celebrex [Celecoxib]      upset stomach  Pt is taking med*   Aspirin Nausea Only    stomach upset     Social History   Tobacco Use   Smoking status: Former   Smokeless tobacco: Never  Substance Use Topics   Alcohol use: No    Family History  Problem Relation Age of Onset   Hypertension Father    Cancer Father    Heart disease Mother    Asthma Son        had as a child   Heart disease Sister    Breast cancer Sister    Hypertension Brother       Review of Systems  Musculoskeletal:  Positive for joint  swelling.     Objective:  Physical Exam Vitals reviewed.  Constitutional:      Appearance: Normal appearance. She is normal weight.  HENT:     Head: Normocephalic and atraumatic.  Eyes:     Extraocular Movements: Extraocular movements intact.     Pupils: Pupils are equal, round, and reactive to light.  Cardiovascular:     Rate and Rhythm: Normal rate and regular rhythm.     Pulses: Normal pulses.  Pulmonary:     Effort: Pulmonary effort is normal.     Breath sounds: Normal breath sounds.  Abdominal:     Palpations: Abdomen is soft.  Musculoskeletal:     Cervical back: Normal range of motion and neck supple.     Right knee: Effusion and bony tenderness present. Tenderness present over the medial joint line and lateral joint line.  Neurological:     Mental Status: She is alert and oriented to person, place, and time.  Psychiatric:        Behavior: Behavior normal.     Vital signs in last 24 hours:    Labs:  Estimated body mass index is 25.5 kg/m as calculated from the following:   Height as of 05/07/22: 5\' 6"  (1.676 m).   Weight as of 05/07/22: 71.7 kg.  Imaging Review Plain radiographs demonstrate loosening of the right knee(s). The overall alignment is neutral.There is evidence of loosening of the femoral and tibial components. The bone quality appears to be good for age and reported activity level.    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   The patient understands fully that we are recommending  a revision of her right total knee arthroplasty due to pain, swelling and loosening as seen on plain films.  The risks and benefits of this surgery have been explained in detail.

## 2022-05-12 ENCOUNTER — Inpatient Hospital Stay (HOSPITAL_COMMUNITY): Payer: Medicaid Other | Admitting: Physician Assistant

## 2022-05-12 ENCOUNTER — Inpatient Hospital Stay (HOSPITAL_COMMUNITY): Payer: Medicaid Other

## 2022-05-12 ENCOUNTER — Encounter (HOSPITAL_COMMUNITY)
Admission: RE | Disposition: A | Discharge status: Home Health | Payer: Self-pay | Source: Home / Self Care | Attending: Orthopaedic Surgery

## 2022-05-12 ENCOUNTER — Encounter (HOSPITAL_COMMUNITY): Payer: Self-pay | Admitting: Orthopaedic Surgery

## 2022-05-12 ENCOUNTER — Inpatient Hospital Stay (HOSPITAL_COMMUNITY)
Admission: RE | Admit: 2022-05-12 | Discharge: 2022-05-14 | DRG: 468 | Disposition: A | Discharge status: Home Health | Payer: Medicaid Other | Attending: Orthopaedic Surgery | Admitting: Orthopaedic Surgery

## 2022-05-12 ENCOUNTER — Other Ambulatory Visit: Payer: Self-pay

## 2022-05-12 ENCOUNTER — Inpatient Hospital Stay (HOSPITAL_COMMUNITY): Payer: Medicaid Other | Admitting: Anesthesiology

## 2022-05-12 DIAGNOSIS — Z79899 Other long term (current) drug therapy: Secondary | ICD-10-CM | POA: Diagnosis not present

## 2022-05-12 DIAGNOSIS — F32A Depression, unspecified: Secondary | ICD-10-CM | POA: Diagnosis present

## 2022-05-12 DIAGNOSIS — J4489 Other specified chronic obstructive pulmonary disease: Secondary | ICD-10-CM | POA: Diagnosis not present

## 2022-05-12 DIAGNOSIS — Z825 Family history of asthma and other chronic lower respiratory diseases: Secondary | ICD-10-CM | POA: Diagnosis not present

## 2022-05-12 DIAGNOSIS — Z91013 Allergy to seafood: Secondary | ICD-10-CM

## 2022-05-12 DIAGNOSIS — Z8249 Family history of ischemic heart disease and other diseases of the circulatory system: Secondary | ICD-10-CM | POA: Diagnosis not present

## 2022-05-12 DIAGNOSIS — I10 Essential (primary) hypertension: Secondary | ICD-10-CM | POA: Diagnosis not present

## 2022-05-12 DIAGNOSIS — Z86010 Personal history of colonic polyps: Secondary | ICD-10-CM

## 2022-05-12 DIAGNOSIS — Z96651 Presence of right artificial knee joint: Secondary | ICD-10-CM

## 2022-05-12 DIAGNOSIS — G2581 Restless legs syndrome: Secondary | ICD-10-CM | POA: Diagnosis present

## 2022-05-12 DIAGNOSIS — Z803 Family history of malignant neoplasm of breast: Secondary | ICD-10-CM | POA: Diagnosis not present

## 2022-05-12 DIAGNOSIS — G8918 Other acute postprocedural pain: Secondary | ICD-10-CM | POA: Diagnosis not present

## 2022-05-12 DIAGNOSIS — T84012A Broken internal right knee prosthesis, initial encounter: Secondary | ICD-10-CM

## 2022-05-12 DIAGNOSIS — Z7902 Long term (current) use of antithrombotics/antiplatelets: Secondary | ICD-10-CM | POA: Diagnosis not present

## 2022-05-12 DIAGNOSIS — Z471 Aftercare following joint replacement surgery: Secondary | ICD-10-CM | POA: Diagnosis not present

## 2022-05-12 DIAGNOSIS — Y792 Prosthetic and other implants, materials and accessory orthopedic devices associated with adverse incidents: Secondary | ICD-10-CM | POA: Diagnosis present

## 2022-05-12 DIAGNOSIS — H903 Sensorineural hearing loss, bilateral: Secondary | ICD-10-CM | POA: Diagnosis not present

## 2022-05-12 DIAGNOSIS — Z7951 Long term (current) use of inhaled steroids: Secondary | ICD-10-CM

## 2022-05-12 DIAGNOSIS — T84032A Mechanical loosening of internal right knee prosthetic joint, initial encounter: Secondary | ICD-10-CM | POA: Diagnosis not present

## 2022-05-12 DIAGNOSIS — E1142 Type 2 diabetes mellitus with diabetic polyneuropathy: Secondary | ICD-10-CM | POA: Diagnosis present

## 2022-05-12 DIAGNOSIS — Z96642 Presence of left artificial hip joint: Secondary | ICD-10-CM | POA: Diagnosis not present

## 2022-05-12 DIAGNOSIS — E1151 Type 2 diabetes mellitus with diabetic peripheral angiopathy without gangrene: Secondary | ICD-10-CM | POA: Diagnosis not present

## 2022-05-12 DIAGNOSIS — G894 Chronic pain syndrome: Secondary | ICD-10-CM | POA: Diagnosis present

## 2022-05-12 DIAGNOSIS — M1711 Unilateral primary osteoarthritis, right knee: Secondary | ICD-10-CM | POA: Diagnosis present

## 2022-05-12 DIAGNOSIS — F419 Anxiety disorder, unspecified: Secondary | ICD-10-CM | POA: Diagnosis not present

## 2022-05-12 DIAGNOSIS — Z9109 Other allergy status, other than to drugs and biological substances: Secondary | ICD-10-CM

## 2022-05-12 DIAGNOSIS — Z87891 Personal history of nicotine dependence: Secondary | ICD-10-CM | POA: Diagnosis not present

## 2022-05-12 DIAGNOSIS — E785 Hyperlipidemia, unspecified: Secondary | ICD-10-CM | POA: Diagnosis present

## 2022-05-12 DIAGNOSIS — J432 Centrilobular emphysema: Secondary | ICD-10-CM | POA: Diagnosis not present

## 2022-05-12 DIAGNOSIS — K219 Gastro-esophageal reflux disease without esophagitis: Secondary | ICD-10-CM | POA: Diagnosis present

## 2022-05-12 DIAGNOSIS — Z7984 Long term (current) use of oral hypoglycemic drugs: Secondary | ICD-10-CM | POA: Diagnosis not present

## 2022-05-12 DIAGNOSIS — J449 Chronic obstructive pulmonary disease, unspecified: Secondary | ICD-10-CM | POA: Diagnosis not present

## 2022-05-12 DIAGNOSIS — J4540 Moderate persistent asthma, uncomplicated: Secondary | ICD-10-CM | POA: Diagnosis not present

## 2022-05-12 DIAGNOSIS — G4733 Obstructive sleep apnea (adult) (pediatric): Secondary | ICD-10-CM | POA: Diagnosis present

## 2022-05-12 DIAGNOSIS — M24661 Ankylosis, right knee: Secondary | ICD-10-CM | POA: Diagnosis present

## 2022-05-12 DIAGNOSIS — E119 Type 2 diabetes mellitus without complications: Secondary | ICD-10-CM

## 2022-05-12 DIAGNOSIS — Z888 Allergy status to other drugs, medicaments and biological substances status: Secondary | ICD-10-CM

## 2022-05-12 DIAGNOSIS — M109 Gout, unspecified: Secondary | ICD-10-CM | POA: Diagnosis present

## 2022-05-12 DIAGNOSIS — Z91018 Allergy to other foods: Secondary | ICD-10-CM

## 2022-05-12 DIAGNOSIS — Z981 Arthrodesis status: Secondary | ICD-10-CM

## 2022-05-12 DIAGNOSIS — Z886 Allergy status to analgesic agent status: Secondary | ICD-10-CM

## 2022-05-12 DIAGNOSIS — T84012D Broken internal right knee prosthesis, subsequent encounter: Principal | ICD-10-CM

## 2022-05-12 DIAGNOSIS — E1149 Type 2 diabetes mellitus with other diabetic neurological complication: Secondary | ICD-10-CM | POA: Diagnosis not present

## 2022-05-12 HISTORY — PX: TOTAL KNEE REVISION: SHX996

## 2022-05-12 LAB — GLUCOSE, CAPILLARY
Glucose-Capillary: 144 mg/dL — ABNORMAL HIGH (ref 70–99)
Glucose-Capillary: 137 mg/dL — ABNORMAL HIGH (ref 70–99)
Glucose-Capillary: 158 mg/dL — ABNORMAL HIGH (ref 70–99)
Glucose-Capillary: 168 mg/dL — ABNORMAL HIGH (ref 70–99)
Glucose-Capillary: 151 mg/dL — ABNORMAL HIGH (ref 70–99)
Glucose-Capillary: 168 mg/dL — ABNORMAL HIGH (ref 70–99)

## 2022-05-12 SURGERY — TOTAL KNEE REVISION
Anesthesia: General | Site: Knee | Laterality: Right

## 2022-05-12 MED ORDER — UMECLIDINIUM BROMIDE 62.5 MCG/ACT IN AEPB
1.0000 | INHALATION_SPRAY | Freq: Every day | RESPIRATORY_TRACT | Status: DC
  Administered 2022-05-13 – 2022-05-14 (×2): 1 via RESPIRATORY_TRACT
  Filled 2022-05-12: qty 7

## 2022-05-12 MED ORDER — INSULIN ASPART 100 UNIT/ML IJ SOLN
INTRAMUSCULAR | Status: AC
  Filled 2022-05-12: qty 1

## 2022-05-12 MED ORDER — FERROUS SULFATE 325 (65 FE) MG PO TABS
325.0000 mg | ORAL_TABLET | Freq: Every day | ORAL | Status: DC
  Administered 2022-05-13 – 2022-05-14 (×2): 325 mg via ORAL
  Filled 2022-05-12 (×2): qty 1

## 2022-05-12 MED ORDER — PHENYLEPHRINE 80 MCG/ML (10ML) SYRINGE FOR IV PUSH (FOR BLOOD PRESSURE SUPPORT)
PREFILLED_SYRINGE | INTRAVENOUS | Status: DC | PRN
  Administered 2022-05-12 (×10): 80 ug via INTRAVENOUS

## 2022-05-12 MED ORDER — LIDOCAINE 2% (20 MG/ML) 5 ML SYRINGE
INTRAMUSCULAR | Status: DC | PRN
  Administered 2022-05-12: 20 mg via INTRAVENOUS

## 2022-05-12 MED ORDER — METFORMIN HCL ER 750 MG PO TB24
750.0000 mg | ORAL_TABLET | Freq: Every day | ORAL | Status: DC
  Administered 2022-05-13 – 2022-05-14 (×2): 750 mg via ORAL
  Filled 2022-05-12 (×2): qty 1

## 2022-05-12 MED ORDER — HYDROCHLOROTHIAZIDE 12.5 MG PO TABS
12.5000 mg | ORAL_TABLET | Freq: Every day | ORAL | Status: DC
  Administered 2022-05-12 – 2022-05-14 (×3): 12.5 mg via ORAL
  Filled 2022-05-12 (×3): qty 1

## 2022-05-12 MED ORDER — LACTATED RINGERS IV SOLN: INTRAVENOUS | Status: DC

## 2022-05-12 MED ORDER — DULOXETINE HCL 60 MG PO CPEP
60.0000 mg | ORAL_CAPSULE | Freq: Every day | ORAL | Status: DC
  Administered 2022-05-13 – 2022-05-14 (×2): 60 mg via ORAL
  Filled 2022-05-12 (×2): qty 1

## 2022-05-12 MED ORDER — ROPIVACAINE HCL 5 MG/ML IJ SOLN
INTRAMUSCULAR | Status: DC | PRN
  Administered 2022-05-12: 20 mL via PERINEURAL

## 2022-05-12 MED ORDER — MUPIROCIN 2 % EX OINT
1.0000 | TOPICAL_OINTMENT | Freq: Once | CUTANEOUS | Status: AC
  Administered 2022-05-12: 1 via TOPICAL
  Filled 2022-05-12: qty 22

## 2022-05-12 MED ORDER — VALSARTAN-HYDROCHLOROTHIAZIDE 160-12.5 MG PO TABS: 1.0000 | ORAL_TABLET | Freq: Every day | ORAL | Status: DC

## 2022-05-12 MED ORDER — ALBUTEROL SULFATE (2.5 MG/3ML) 0.083% IN NEBU: 2.5000 mg | INHALATION_SOLUTION | Freq: Four times a day (QID) | RESPIRATORY_TRACT | Status: DC | PRN

## 2022-05-12 MED ORDER — POTASSIUM CHLORIDE CRYS ER 20 MEQ PO TBCR
20.0000 meq | EXTENDED_RELEASE_TABLET | Freq: Every day | ORAL | Status: DC
  Administered 2022-05-12 – 2022-05-14 (×3): 20 meq via ORAL
  Filled 2022-05-12 (×3): qty 1

## 2022-05-12 MED ORDER — DOCUSATE SODIUM 100 MG PO CAPS
100.0000 mg | ORAL_CAPSULE | Freq: Two times a day (BID) | ORAL | Status: DC
  Administered 2022-05-12 – 2022-05-14 (×4): 100 mg via ORAL
  Filled 2022-05-12 (×4): qty 1

## 2022-05-12 MED ORDER — ESMOLOL HCL 100 MG/10ML IV SOLN
INTRAVENOUS | Status: DC | PRN
  Administered 2022-05-12: 30 mg via INTRAVENOUS

## 2022-05-12 MED ORDER — METOCLOPRAMIDE HCL 5 MG PO TABS: 5.0000 mg | ORAL_TABLET | Freq: Three times a day (TID) | ORAL | Status: DC | PRN

## 2022-05-12 MED ORDER — PANTOPRAZOLE SODIUM 40 MG PO TBEC
40.0000 mg | DELAYED_RELEASE_TABLET | Freq: Every day | ORAL | Status: DC
  Administered 2022-05-13 – 2022-05-14 (×2): 40 mg via ORAL
  Filled 2022-05-12 (×2): qty 1

## 2022-05-12 MED ORDER — MOMETASONE FURO-FORMOTEROL FUM 200-5 MCG/ACT IN AERO
2.0000 | INHALATION_SPRAY | Freq: Two times a day (BID) | RESPIRATORY_TRACT | Status: DC
  Administered 2022-05-12 – 2022-05-14 (×4): 2 via RESPIRATORY_TRACT
  Filled 2022-05-12: qty 8.8

## 2022-05-12 MED ORDER — 0.9 % SODIUM CHLORIDE (POUR BTL) OPTIME
TOPICAL | Status: DC | PRN
  Administered 2022-05-12: 1000 mL

## 2022-05-12 MED ORDER — ACETAMINOPHEN 500 MG PO TABS
ORAL_TABLET | ORAL | Status: AC
  Administered 2022-05-12: 1000 mg via ORAL
  Filled 2022-05-12: qty 2

## 2022-05-12 MED ORDER — FENTANYL CITRATE (PF) 250 MCG/5ML IJ SOLN
INTRAMUSCULAR | Status: AC
  Filled 2022-05-12: qty 5

## 2022-05-12 MED ORDER — TIOTROPIUM BROMIDE MONOHYDRATE 18 MCG IN CAPS: 18.0000 ug | ORAL_CAPSULE | Freq: Every day | RESPIRATORY_TRACT | Status: DC

## 2022-05-12 MED ORDER — MIDAZOLAM HCL 2 MG/2ML IJ SOLN
INTRAMUSCULAR | Status: DC | PRN
  Administered 2022-05-12: 1 mg via INTRAVENOUS

## 2022-05-12 MED ORDER — ONDANSETRON HCL 4 MG/2ML IJ SOLN
INTRAMUSCULAR | Status: DC | PRN
  Administered 2022-05-12: 4 mg via INTRAVENOUS

## 2022-05-12 MED ORDER — LORATADINE 10 MG PO TABS
10.0000 mg | ORAL_TABLET | Freq: Every day | ORAL | Status: DC
  Administered 2022-05-13 – 2022-05-14 (×2): 10 mg via ORAL
  Filled 2022-05-12 (×2): qty 1

## 2022-05-12 MED ORDER — ALUM & MAG HYDROXIDE-SIMETH 200-200-20 MG/5ML PO SUSP: 30.0000 mL | ORAL | Status: DC | PRN

## 2022-05-12 MED ORDER — IRBESARTAN 150 MG PO TABS
150.0000 mg | ORAL_TABLET | Freq: Every day | ORAL | Status: DC
  Administered 2022-05-12 – 2022-05-14 (×3): 150 mg via ORAL
  Filled 2022-05-12 (×3): qty 1

## 2022-05-12 MED ORDER — METHOCARBAMOL 500 MG PO TABS
500.0000 mg | ORAL_TABLET | Freq: Four times a day (QID) | ORAL | Status: DC | PRN
  Administered 2022-05-13 – 2022-05-14 (×2): 500 mg via ORAL
  Filled 2022-05-12 (×2): qty 1

## 2022-05-12 MED ORDER — DIPHENHYDRAMINE HCL 12.5 MG/5ML PO ELIX: 12.5000 mg | ORAL_SOLUTION | ORAL | Status: DC | PRN

## 2022-05-12 MED ORDER — SODIUM CHLORIDE 0.9 % IV SOLN: INTRAVENOUS | Status: DC

## 2022-05-12 MED ORDER — PROPOFOL 10 MG/ML IV BOLUS
INTRAVENOUS | Status: AC
  Filled 2022-05-12: qty 20

## 2022-05-12 MED ORDER — PROPOFOL 10 MG/ML IV BOLUS
INTRAVENOUS | Status: DC | PRN
  Administered 2022-05-12: 150 mg via INTRAVENOUS
  Administered 2022-05-12: 50 mg via INTRAVENOUS

## 2022-05-12 MED ORDER — FENTANYL CITRATE (PF) 100 MCG/2ML IJ SOLN
INTRAMUSCULAR | Status: AC
  Filled 2022-05-12: qty 2

## 2022-05-12 MED ORDER — DEXAMETHASONE SODIUM PHOSPHATE 10 MG/ML IJ SOLN
INTRAMUSCULAR | Status: DC | PRN
  Administered 2022-05-12: 4 mg via INTRAVENOUS

## 2022-05-12 MED ORDER — ALLOPURINOL 100 MG PO TABS
100.0000 mg | ORAL_TABLET | Freq: Every day | ORAL | Status: DC
  Administered 2022-05-13 – 2022-05-14 (×2): 100 mg via ORAL
  Filled 2022-05-12 (×2): qty 1

## 2022-05-12 MED ORDER — CEFAZOLIN SODIUM-DEXTROSE 2-4 GM/100ML-% IV SOLN
INTRAVENOUS | Status: AC
  Filled 2022-05-12: qty 100

## 2022-05-12 MED ORDER — LABETALOL HCL 5 MG/ML IV SOLN
INTRAVENOUS | Status: AC
  Filled 2022-05-12: qty 4

## 2022-05-12 MED ORDER — MONTELUKAST SODIUM 10 MG PO TABS
10.0000 mg | ORAL_TABLET | Freq: Every day | ORAL | Status: DC
  Administered 2022-05-12 – 2022-05-14 (×3): 10 mg via ORAL
  Filled 2022-05-12 (×3): qty 1

## 2022-05-12 MED ORDER — CHLORHEXIDINE GLUCONATE 0.12 % MT SOLN
OROMUCOSAL | Status: AC
  Administered 2022-05-12: 15 mL via OROMUCOSAL
  Filled 2022-05-12: qty 15

## 2022-05-12 MED ORDER — CEFAZOLIN SODIUM-DEXTROSE 1-4 GM/50ML-% IV SOLN
1.0000 g | Freq: Four times a day (QID) | INTRAVENOUS | Status: AC
  Administered 2022-05-12 – 2022-05-13 (×2): 1 g via INTRAVENOUS
  Filled 2022-05-12 (×2): qty 50

## 2022-05-12 MED ORDER — ACETAMINOPHEN 325 MG PO TABS
325.0000 mg | ORAL_TABLET | Freq: Four times a day (QID) | ORAL | Status: DC | PRN
  Administered 2022-05-14 (×2): 650 mg via ORAL
  Filled 2022-05-12 (×2): qty 2

## 2022-05-12 MED ORDER — INSULIN ASPART 100 UNIT/ML IJ SOLN
0.0000 [IU] | INTRAMUSCULAR | Status: AC | PRN
  Administered 2022-05-12 (×2): 2 [IU] via SUBCUTANEOUS

## 2022-05-12 MED ORDER — OXYCODONE HCL 5 MG PO TABS
5.0000 mg | ORAL_TABLET | ORAL | Status: DC | PRN
  Administered 2022-05-12: 10 mg via ORAL
  Administered 2022-05-13: 5 mg via ORAL
  Filled 2022-05-12 (×3): qty 2

## 2022-05-12 MED ORDER — CLONIDINE HCL (ANALGESIA) 100 MCG/ML EP SOLN
EPIDURAL | Status: DC | PRN
  Administered 2022-05-12: 50 ug

## 2022-05-12 MED ORDER — TRANEXAMIC ACID-NACL 1000-0.7 MG/100ML-% IV SOLN
INTRAVENOUS | Status: AC
  Filled 2022-05-12: qty 100

## 2022-05-12 MED ORDER — METHOCARBAMOL 1000 MG/10ML IJ SOLN
500.0000 mg | Freq: Four times a day (QID) | INTRAVENOUS | Status: DC | PRN
  Filled 2022-05-12: qty 5

## 2022-05-12 MED ORDER — MIDAZOLAM HCL 2 MG/2ML IJ SOLN
INTRAMUSCULAR | Status: AC
  Filled 2022-05-12: qty 2

## 2022-05-12 MED ORDER — FENTANYL CITRATE (PF) 100 MCG/2ML IJ SOLN: 50.0000 ug | Freq: Once | INTRAMUSCULAR | Status: DC

## 2022-05-12 MED ORDER — DEXAMETHASONE SODIUM PHOSPHATE 10 MG/ML IJ SOLN
INTRAMUSCULAR | Status: AC
  Filled 2022-05-12: qty 1

## 2022-05-12 MED ORDER — CHLORHEXIDINE GLUCONATE 0.12 % MT SOLN: 15.0000 mL | Freq: Once | OROMUCOSAL | Status: AC

## 2022-05-12 MED ORDER — APIXABAN 2.5 MG PO TABS
2.5000 mg | ORAL_TABLET | Freq: Two times a day (BID) | ORAL | Status: DC
  Administered 2022-05-13 – 2022-05-14 (×3): 2.5 mg via ORAL
  Filled 2022-05-12 (×3): qty 1

## 2022-05-12 MED ORDER — POVIDONE-IODINE 10 % EX SWAB: 2.0000 | Freq: Once | CUTANEOUS | Status: DC

## 2022-05-12 MED ORDER — FENTANYL CITRATE (PF) 100 MCG/2ML IJ SOLN: 25.0000 ug | INTRAMUSCULAR | Status: DC | PRN

## 2022-05-12 MED ORDER — ACETAMINOPHEN 500 MG PO TABS: 1000.0000 mg | ORAL_TABLET | Freq: Once | ORAL | Status: AC

## 2022-05-12 MED ORDER — LABETALOL HCL 5 MG/ML IV SOLN
INTRAVENOUS | Status: DC | PRN
  Administered 2022-05-12: 5 mg via INTRAVENOUS
  Administered 2022-05-12 (×2): 2.5 mg via INTRAVENOUS

## 2022-05-12 MED ORDER — GLYCOPYRROLATE 1 MG PO TABS
2.0000 mg | ORAL_TABLET | Freq: Three times a day (TID) | ORAL | Status: DC
  Administered 2022-05-12 – 2022-05-14 (×5): 2 mg via ORAL
  Filled 2022-05-12 (×7): qty 2

## 2022-05-12 MED ORDER — MENTHOL 3 MG MT LOZG: 1.0000 | LOZENGE | OROMUCOSAL | Status: DC | PRN

## 2022-05-12 MED ORDER — ONDANSETRON HCL 4 MG PO TABS: 4.0000 mg | ORAL_TABLET | Freq: Four times a day (QID) | ORAL | Status: DC | PRN

## 2022-05-12 MED ORDER — AMISULPRIDE (ANTIEMETIC) 5 MG/2ML IV SOLN: 10.0000 mg | Freq: Once | INTRAVENOUS | Status: DC | PRN

## 2022-05-12 MED ORDER — FLUTICASONE PROPIONATE 50 MCG/ACT NA SUSP
1.0000 | Freq: Every day | NASAL | Status: DC
  Administered 2022-05-13 – 2022-05-14 (×2): 1 via NASAL
  Filled 2022-05-12: qty 16

## 2022-05-12 MED ORDER — PHENYLEPHRINE 80 MCG/ML (10ML) SYRINGE FOR IV PUSH (FOR BLOOD PRESSURE SUPPORT)
PREFILLED_SYRINGE | INTRAVENOUS | Status: AC
  Filled 2022-05-12: qty 10

## 2022-05-12 MED ORDER — FENTANYL CITRATE (PF) 250 MCG/5ML IJ SOLN
INTRAMUSCULAR | Status: DC | PRN
  Administered 2022-05-12 (×7): 25 ug via INTRAVENOUS

## 2022-05-12 MED ORDER — ALBUMIN HUMAN 5 % IV SOLN: INTRAVENOUS | Status: DC | PRN

## 2022-05-12 MED ORDER — ATORVASTATIN CALCIUM 40 MG PO TABS
40.0000 mg | ORAL_TABLET | Freq: Every day | ORAL | Status: DC
  Administered 2022-05-12 – 2022-05-14 (×3): 40 mg via ORAL
  Filled 2022-05-12 (×3): qty 1

## 2022-05-12 MED ORDER — SODIUM CHLORIDE 0.9 % IR SOLN
Status: DC | PRN
  Administered 2022-05-12: 3000 mL

## 2022-05-12 MED ORDER — CEFAZOLIN SODIUM-DEXTROSE 2-4 GM/100ML-% IV SOLN
2.0000 g | INTRAVENOUS | Status: AC
  Administered 2022-05-12: 2 g via INTRAVENOUS

## 2022-05-12 MED ORDER — PHENOL 1.4 % MT LIQD: 1.0000 | OROMUCOSAL | Status: DC | PRN

## 2022-05-12 MED ORDER — HYDROMORPHONE HCL 1 MG/ML IJ SOLN
0.5000 mg | INTRAMUSCULAR | Status: DC | PRN
  Administered 2022-05-14: 1 mg via INTRAVENOUS
  Filled 2022-05-12: qty 1

## 2022-05-12 MED ORDER — ONDANSETRON HCL 4 MG/2ML IJ SOLN
INTRAMUSCULAR | Status: AC
  Filled 2022-05-12: qty 2

## 2022-05-12 MED ORDER — PHENYLEPHRINE HCL-NACL 20-0.9 MG/250ML-% IV SOLN
INTRAVENOUS | Status: DC | PRN
  Administered 2022-05-12: 30 ug/min via INTRAVENOUS

## 2022-05-12 MED ORDER — ESMOLOL HCL 100 MG/10ML IV SOLN
INTRAVENOUS | Status: AC
  Filled 2022-05-12: qty 10

## 2022-05-12 MED ORDER — METOCLOPRAMIDE HCL 5 MG/ML IJ SOLN: 5.0000 mg | Freq: Three times a day (TID) | INTRAMUSCULAR | Status: DC | PRN

## 2022-05-12 MED ORDER — OXYCODONE HCL 5 MG PO TABS
10.0000 mg | ORAL_TABLET | ORAL | Status: DC | PRN
  Administered 2022-05-13 (×2): 10 mg via ORAL
  Administered 2022-05-14: 15 mg via ORAL
  Filled 2022-05-12: qty 2
  Filled 2022-05-12: qty 3

## 2022-05-12 MED ORDER — ORAL CARE MOUTH RINSE: 15.0000 mL | Freq: Once | OROMUCOSAL | Status: AC

## 2022-05-12 MED ORDER — ONDANSETRON HCL 4 MG/2ML IJ SOLN: 4.0000 mg | Freq: Four times a day (QID) | INTRAMUSCULAR | Status: DC | PRN

## 2022-05-12 MED ORDER — TRANEXAMIC ACID-NACL 1000-0.7 MG/100ML-% IV SOLN
INTRAVENOUS | Status: DC | PRN
  Administered 2022-05-12: 1000 mg via INTRAVENOUS

## 2022-05-12 MED ORDER — MELATONIN 5 MG PO TABS
5.0000 mg | ORAL_TABLET | Freq: Every day | ORAL | Status: DC
  Administered 2022-05-12 – 2022-05-13 (×2): 5 mg via ORAL
  Filled 2022-05-12 (×2): qty 1

## 2022-05-12 SURGICAL SUPPLY — 81 items
ATTUNE DIST FEM SZ5 4 KNEE (Miscellaneous) IMPLANT
AUG DIST CMT FEM KNEE 5X8 (Joint) ×1 IMPLANT
AUG FEM SZ5 4 REV DIST STRL LF (Miscellaneous) ×1 IMPLANT
AUG FEM SZ5 8 REV DIST STRL LF (Joint) ×1 IMPLANT
AUGMENT DIST CMT FEM KNEE 5X8 (Joint) IMPLANT
BAG COUNTER SPONGE SURGICOUNT (BAG) ×1 IMPLANT
BAG SPNG CNTER NS LX DISP (BAG) ×1
BANDAGE ESMARK 6X9 LF (GAUZE/BANDAGES/DRESSINGS) ×1 IMPLANT
BLADE CLIPPER SURG (BLADE) IMPLANT
BLADE SAG 18X100X1.27 (BLADE) ×1 IMPLANT
BLADE SAGITTAL 25.0X1.27X90 (BLADE) ×1 IMPLANT
BNDG CMPR 5X6 CHSV STRCH STRL (GAUZE/BANDAGES/DRESSINGS) ×2
BNDG CMPR 9X6 STRL LF SNTH (GAUZE/BANDAGES/DRESSINGS) ×1
BNDG CMPR MED 10X6 ELC LF (GAUZE/BANDAGES/DRESSINGS) ×1
BNDG COHESIVE 6X5 TAN ST LF (GAUZE/BANDAGES/DRESSINGS) ×2 IMPLANT
BNDG ELASTIC 6X10 VLCR STRL LF (GAUZE/BANDAGES/DRESSINGS) IMPLANT
BNDG ELASTIC 6X5.8 VLCR STR LF (GAUZE/BANDAGES/DRESSINGS) ×1 IMPLANT
BNDG ESMARK 6X9 LF (GAUZE/BANDAGES/DRESSINGS) ×1
BOWL SMART MIX CTS (DISPOSABLE) IMPLANT
BSPLAT TIB 5 CMNT REV ROT PLAT (Orthopedic Implant) ×1 IMPLANT
CEMENT HV SMART SET (Cement) IMPLANT
COMP FEM ATTUNE CRS CEM RT SZ5 (Femur) ×1 IMPLANT
COMPONENT FEM ATN CR CEM RTSZ5 (Femur) IMPLANT
COVER SURGICAL LIGHT HANDLE (MISCELLANEOUS) ×1 IMPLANT
CUFF TOURN SGL QUICK 34 (TOURNIQUET CUFF)
CUFF TOURN SGL QUICK 42 (TOURNIQUET CUFF) IMPLANT
CUFF TRNQT CYL 34X4.125X (TOURNIQUET CUFF) IMPLANT
DRAPE ORTHO SPLIT 77X108 STRL (DRAPES) ×2
DRAPE SURG ORHT 6 SPLT 77X108 (DRAPES) ×2 IMPLANT
DRAPE U-SHAPE 47X51 STRL (DRAPES) ×1 IMPLANT
DURAPREP 26ML APPLICATOR (WOUND CARE) ×1 IMPLANT
ELECT REM PT RETURN 9FT ADLT (ELECTROSURGICAL) ×1
ELECTRODE REM PT RTRN 9FT ADLT (ELECTROSURGICAL) ×1 IMPLANT
EVACUATOR 1/8 PVC DRAIN (DRAIN) IMPLANT
FACESHIELD STD STERILE (MASK) ×2 IMPLANT
GAUZE PAD ABD 8X10 STRL (GAUZE/BANDAGES/DRESSINGS) ×2 IMPLANT
GAUZE SPONGE 4X4 12PLY STRL (GAUZE/BANDAGES/DRESSINGS) ×1 IMPLANT
GAUZE XEROFORM 1X8 LF (GAUZE/BANDAGES/DRESSINGS) ×1 IMPLANT
GLOVE BIO SURGEON STRL SZ8 (GLOVE) ×1 IMPLANT
GLOVE BIOGEL PI IND STRL 8 (GLOVE) ×2 IMPLANT
GLOVE ORTHO TXT STRL SZ7.5 (GLOVE) ×1 IMPLANT
GOWN STRL REUS W/ TWL LRG LVL3 (GOWN DISPOSABLE) ×1 IMPLANT
GOWN STRL REUS W/ TWL XL LVL3 (GOWN DISPOSABLE) ×2 IMPLANT
GOWN STRL REUS W/TWL LRG LVL3 (GOWN DISPOSABLE) ×1
GOWN STRL REUS W/TWL XL LVL3 (GOWN DISPOSABLE) ×2
HANDPIECE INTERPULSE COAX TIP (DISPOSABLE)
IMMOBILIZER KNEE 22 UNIV (SOFTGOODS) ×1 IMPLANT
INSERT TIB REV CRS RP SZ5 10 (Insert) IMPLANT
INSRT TIB REV CRS RP SZ5 10 (Insert) ×1 IMPLANT
KIT BASIN OR (CUSTOM PROCEDURE TRAY) ×1 IMPLANT
KIT TURNOVER KIT B (KITS) ×1 IMPLANT
MANIFOLD NEPTUNE II (INSTRUMENTS) ×1 IMPLANT
NS IRRIG 1000ML POUR BTL (IV SOLUTION) ×1 IMPLANT
PACK TOTAL JOINT (CUSTOM PROCEDURE TRAY) ×1 IMPLANT
PAD ARMBOARD 7.5X6 YLW CONV (MISCELLANEOUS) ×2 IMPLANT
PADDING CAST COTTON 6X4 STRL (CAST SUPPLIES) ×1 IMPLANT
PIN STEINMAN FIXATION KNEE (PIN) IMPLANT
PLATE REV TIB BAS ROT SZ5 KNEE (Orthopedic Implant) IMPLANT
REV ATTUNE STEM 14X30 KNEE (Orthopedic Implant) ×1 IMPLANT
REV TIB BASE ROT PLAT SZ5 KNEE (Orthopedic Implant) ×1 IMPLANT
SET HNDPC FAN SPRY TIP SCT (DISPOSABLE) IMPLANT
SET PAD KNEE POSITIONER (MISCELLANEOUS) ×1 IMPLANT
STAPLER VISISTAT 35W (STAPLE) ×1 IMPLANT
STEM REV ATTUNE 14X30 KNEE (Orthopedic Implant) IMPLANT
STEM REV CEMENTED 14X50MM (Stem) IMPLANT
SUCTION FRAZIER HANDLE 10FR (MISCELLANEOUS) ×1
SUCTION TUBE FRAZIER 10FR DISP (MISCELLANEOUS) ×1 IMPLANT
SUT VIC AB 0 CT1 27 (SUTURE) ×2
SUT VIC AB 0 CT1 27XBRD ANBCTR (SUTURE) ×2 IMPLANT
SUT VIC AB 1 CT1 27 (SUTURE) ×2
SUT VIC AB 1 CT1 27XBRD ANBCTR (SUTURE) ×2 IMPLANT
SUT VIC AB 2-0 CT1 27 (SUTURE) ×3
SUT VIC AB 2-0 CT1 TAPERPNT 27 (SUTURE) ×2 IMPLANT
SWAB COLLECTION DEVICE MRSA (MISCELLANEOUS) IMPLANT
SWAB CULTURE ESWAB REG 1ML (MISCELLANEOUS) ×1 IMPLANT
TOWEL GREEN STERILE (TOWEL DISPOSABLE) ×1 IMPLANT
TOWEL GREEN STERILE FF (TOWEL DISPOSABLE) ×1 IMPLANT
TRAY FOLEY W/BAG SLVR 16FR (SET/KITS/TRAYS/PACK)
TRAY FOLEY W/BAG SLVR 16FR ST (SET/KITS/TRAYS/PACK) IMPLANT
WATER STERILE IRR 1000ML POUR (IV SOLUTION) ×3 IMPLANT
WRAP KNEE MAXI GEL POST OP (GAUZE/BANDAGES/DRESSINGS) ×1 IMPLANT

## 2022-05-12 NOTE — Op Note (Signed)
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 Item Serial No. Manufacturer Lot No. LRB No. Used Action  CEMENT HV SMART SET - [id number] Cement CEMENT HV SMART SET  [employer] [id number] Right 3 Implanted  ATTUNE DIST FEM SZ5 4 KNEE - [id number] Miscellaneous ATTUNE DIST FEM SZ5 4 KNEE  [employer] [id number] Right 1 Implanted  AUG DIST CMT FEM KNEE 5X8 - [id number] Joint AUG DIST CMT FEM KNEE 5X8  DEPU[employer] OR[employer] [id number] Right 1 Implanted  COMP FEM ATTUNE CRS CEM RT SZ5 - [id number] Femur COMP FEM ATTUNE CRS CEM RT SZ5  [employer] [id number] Right 1 Implanted  STEM REV CEMENTED 14X50MM - [id number] Stem STEM REV CEMENTED 14X50MM  [employer] [id number] Right 1 Implanted  REV ATTUNE STEM 14X30 KNEE - [id number] Orthopedic Implant REV ATTUNE STEM 14X30 KNEE  DEPU[employer] [id number] Right 1 Implanted  REV TIB BASE ROT PLAT SZ5 KNEE - [id number] Orthopedic Implant REV TIB BASE ROT PLAT SZ5 KNEE  [employer] [id number] Right 1 Implanted  INSRT TIB REV CRS RP SZ5 10 - [id number] Insert INSRT TIB REV CRS RP SZ5 10  [employer] [id number] Right 1 Implanted   Surgeon: Vanita Panda. Magnus Ivan, MD Assistant: Rexene Edison, PA-C  Anesthesia: #1 right lower extremity adductor canal block, #2 General EBL: Less than 100 cc Tourniquet time just under 2 hours Complications: None  Indications: The patient is a 62 year old female with a remote history of a right total knee arthroplasty that was performed years ago.  Over time she has developed aseptic loosening of that knee.  She did have problems with postoperative knee arthrofibrosis and required 2 manipulations under anesthesia.  Again that was years ago.  She was able to get back most of her  motion.  With time her knees develop swelling and no evidence of infection on clinical assessment and exam including aspirations and inflammatory markers.  Three-phase bone scan showed loosening of the femoral and tibial components.  At this point with her continued pain we have recommended revision arthroplasty of the right total knee.  We had a long and thorough discussion about the risk of acute blood loss anemia, nerve or vessel injury, bone loss and fracture, implant failure, DVT and wound healing issues.  She understands her goals are hopefully decreased knee pain and improve mobility with improve quality of life overall.  Procedure description: After informed consent was obtained and the appropriate right knee was marked, anesthesia obtained a right lower extremity adductor canal block in the holding room.  The patient was then brought to the operating room and placed upon the operating table general anesthesia was then obtained.  A nonsterile tourniquet placed around her upper right thigh and the right thigh, knee, leg and ankle were prepped and draped in DuraPrep and sterile drapes including sterile stockinette.  A timeout was called and she was identified as the correct patient the correct right knee.  An Esmarch was then used to wrap out the leg and the tourniquet was plated to 300 mm of pressure.  A direct midline incision was then made over the patella through the previous incision and carried proximally distally.  Dissection was carried down to the knee joint and a medial parapatellar arthrotomy was made.  There was fortunately only mild effusion that was encountered and no gross  purulence.  There is no evidence grossly of infection at all.  There was some synovitis from poly wear.  With the knee flexed we removed the polyliner easily.  We then slowly meticulously removed the femoral component and then the tibial component.  There was some evidence of loosening.  We removed cement debris from the  knee on both the femur and the tibia.  We then started on the tibial side of things from the revision standpoint.  We did a freshening tibial cut using a tibia cutting guide.  From there we then sized our tibia for size 5 tibia.  We decided to use a stem system.  We drilled for a 14 x 30 stem.  We then prepared our femur and chose a size 5 femur.  We also drilled and reamed the canal for a 14 x 50 stem for the femur.  We then put our femoral cutting block and did her anterior posterior cuts followed by freshening chamfer cuts.  We performed a femoral box cut as well.  We then trialed our size 5 stem and femur with our tibial revision tray and stem.  We then trialed up to a 10 mm thickness PS rotating platform polyliner.  With all transportation the knee we put the knee through several cycles of motion we are pleased with range of motion and stability.  Of note on the femur we did place a 4 mm wedge medial and 8 mm wedge lateral.  Also of note, the patellar component was intact with minimal poly wear.  We then removed all trial instrumentation of the knee and irrigate the knee with 3 L normal saline solution.  We then mixed our cement and cemented our DePuy attune revision tibial tray with the stem followed by our our femoral component with a stem.  We placed our 10 mm thickness polyethylene insert and held the knee fully extended and compressed while the cement hardened.  Once the cemented hardened we put her through range of motion of the knee we replaced with stability.  We then let the tourniquet down and hemostasis was obtained with electrocautery.  The arthrotomy was closed with interrupted #1 Vicryl suture followed by 0 Vicryl to close the deep tissue and 2-0 Vicryl because subcutaneous tissue.  The skin was closed with staples.  Well-padded sterile dressings applied.  She was awakened, extubated and taken recovery in stable addition.  Rexene Edison, PA-C did assist in entire case from beginning to end and his  assistance was medically necessary and crucial for soft tissue management and retraction, helping guide implant placement and a layered closure of the wound.

## 2022-05-12 NOTE — Anesthesia Procedure Notes (Signed)
Anesthesia Regional Block: Adductor canal block   Pre-Anesthetic Checklist: , timeout performed,  Correct Patient, Correct Site, Correct Laterality,  Correct Procedure, Correct Position, site marked,  Risks and benefits discussed,  Surgical consent,  Pre-op evaluation,  At surgeon's request and post-op pain management  Laterality: Right  Prep: chloraprep       Needles:  Injection technique: Single-shot  Needle Type: Echogenic Needle     Needle Length: 9cm  Needle Gauge: 21     Additional Needles:   Procedures:,,,, ultrasound used (permanent image in chart),,    Narrative:  Start time: 05/12/2022 1:07 PM End time: 05/12/2022 1:12 PM Injection made incrementally with aspirations every 5 mL.  Performed by: Personally  Anesthesiologist: Marcene Duos, MD

## 2022-05-12 NOTE — Anesthesia Procedure Notes (Signed)
Procedure Name: LMA Insertion Date/Time: 05/12/2022 3:10 PM  Performed by: Audie Pinto, CRNAPre-anesthesia Checklist: Patient identified, Emergency Drugs available, Suction available and Patient being monitored Patient Re-evaluated:Patient Re-evaluated prior to induction Oxygen Delivery Method: Circle system utilized Preoxygenation: Pre-oxygenation with 100% oxygen Induction Type: IV induction Ventilation: Oral airway inserted - appropriate to patient size LMA: LMA inserted LMA Size: 4.0 Placement Confirmation: positive ETCO2 Dental Injury: Teeth and Oropharynx as per pre-operative assessment

## 2022-05-12 NOTE — Transfer of Care (Signed)
Immediate Anesthesia Transfer of Care Note  Patient: Stacy [name]  Procedure(s) Performed: RIGHT TOTAL KNEE REVISION ARTHROPLASTY (Right: Knee)  Patient Location: PACU  Anesthesia Type:GA combined with regional for post-op pain  Level of Consciousness: awake  Airway & Oxygen Therapy: Patient Spontanous Breathing  Post-op Assessment: Report given to RN and Post -op Vital signs reviewed and stable  Post vital signs: Reviewed and stable  Last Vitals:  Vitals Value Taken Time  BP 160/90   Temp 98   Pulse 75   Resp 17 05/12/22 1748  SpO2 98   Vitals shown include unvalidated device data.  Last Pain:  Vitals:   05/12/22 1121  TempSrc: Oral  PainSc: 8          Complications: No notable events documented.

## 2022-05-12 NOTE — Interval H&P Note (Signed)
History and Physical Interval Note: Patient understands that she is here today for right total knee revision arthroplasty due to failed right total knee arthroplasty with aseptic loosening.  There has been no acute or interval change in her medical status.  See H&P.  The risks and benefits of surgery been explained in detail and informed consent is obtained.  The right operative knee has been marked.  05/12/2022 1:49 PM  Stacy [name]  has presented today for surgery, with the diagnosis of failed, loose right total knee arthroplasty.  The various methods of treatment have been discussed with the patient and family. After consideration of risks, benefits and other options for treatment, the patient has consented to  Procedure(s): RIGHT TOTAL KNEE REVISION ARTHROPLASTY (Right) as a surgical intervention.  The patient's history has been reviewed, patient examined, no change in status, stable for surgery.  I have reviewed the patient's chart and labs.  Questions were answered to the patient's satisfaction.     [name]

## 2022-05-12 NOTE — Progress Notes (Signed)
Pt. Arrived to unit alert and oriented x 4 c/o back pain. Family at bedside, bed in lowest position call bell within reach.

## 2022-05-13 ENCOUNTER — Encounter (HOSPITAL_COMMUNITY): Payer: Self-pay | Admitting: Orthopaedic Surgery

## 2022-05-13 DIAGNOSIS — F331 Major depressive disorder, recurrent, moderate: Secondary | ICD-10-CM | POA: Diagnosis not present

## 2022-05-13 LAB — CBC
HCT: 31.1 % — ABNORMAL LOW (ref 36.0–46.0)
Hemoglobin: 10.4 g/dL — ABNORMAL LOW (ref 12.0–15.0)
MCH: 27 pg (ref 26.0–34.0)
MCHC: 33.4 g/dL (ref 30.0–36.0)
MCV: 80.8 fL (ref 80.0–100.0)
Platelets: 262 10*3/uL (ref 150–400)
RBC: 3.85 MIL/uL — ABNORMAL LOW (ref 3.87–5.11)
RDW: 14.1 % (ref 11.5–15.5)
WBC: 11.1 10*3/uL — ABNORMAL HIGH (ref 4.0–10.5)
nRBC: 0 % (ref 0.0–0.2)

## 2022-05-13 LAB — GLUCOSE, CAPILLARY: Glucose-Capillary: 132 mg/dL — ABNORMAL HIGH (ref 70–99)

## 2022-05-13 LAB — BASIC METABOLIC PANEL
Anion gap: 10 (ref 5–15)
BUN: 15 mg/dL (ref 8–23)
CO2: 26 mmol/L (ref 22–32)
Calcium: 9 mg/dL (ref 8.9–10.3)
Chloride: 98 mmol/L (ref 98–111)
Creatinine, Ser: 0.78 mg/dL (ref 0.44–1.00)
GFR, Estimated: >60 mL/min (ref >60)
Glucose, Bld: 163 mg/dL — ABNORMAL HIGH (ref 70–99)
Potassium: 3.8 mmol/L (ref 3.5–5.1)
Sodium: 134 mmol/L — ABNORMAL LOW (ref 135–145)

## 2022-05-13 MED ORDER — ORAL CARE MOUTH RINSE: 15.0000 mL | OROMUCOSAL | Status: DC | PRN

## 2022-05-13 NOTE — Progress Notes (Signed)
Physical Therapy Evaluation Patient Details Name: [facility] MRN: [id number] DOB: Oct 28, 1960 Today's Date: 05/13/2022  History of Present Illness  62 yo female with at least a year of instability and issues on R TKA was admitted on 4/9 for revision of all components on R knee.  Her joint is WBAT with movement permitted, in immobilizer at eval.  PMHx:  OA, R TKA, emphysema, lumbar fusion, IBS, incontinence of urine, lung nodule, lumbar radiculopathy, spinal stenosis, COPD, gout  Clinical Impression  Pt was seen for mobility after revision to R knee, demonstrating a good awareness of how to initiate moving given prior experience with knee surgery.  Pt was able to walk a trip on the hallway, practices BR transfers twice and demonstrates awareness of set up with immob to transition to stand and sit.  Will recommend HHPT see her, to have a RW and 3 in one for home and to progress with acute PT goals for walking independence as well as stair training for home.  Has entrance steps with no railing at home.       Recommendations for follow up therapy are one component of a multi-disciplinary discharge planning process, led by the attending physician.  Recommendations may be updated based on patient status, additional functional criteria and insurance authorization.  Follow Up Recommendations       Assistance Recommended at Discharge Intermittent Supervision/Assistance  Patient can return home with the following  A little help with walking and/or transfers;A little help with bathing/dressing/bathroom;Assistance with cooking/housework;Assist for transportation;Help with stairs or ramp for entrance    Equipment Recommendations Rolling walker (2 wheels);BSC/3in1  Recommendations for Other Services       Functional Status Assessment Patient has had a recent decline in their functional status and demonstrates the ability to make significant improvements in function in a reasonable and  predictable amount of time.     Precautions / Restrictions Precautions Precautions: Knee Precaution Booklet Issued: No Precaution Comments: reviewed with pt Required Braces or Orthoses: Knee Immobilizer - Right Knee Immobilizer - Right: Discontinue once straight leg raise with < 10 degree lag Restrictions Weight Bearing Restrictions: Yes RLE Weight Bearing: Weight bearing as tolerated      Mobility  Bed Mobility Overal bed mobility: Needs Assistance Bed Mobility: Supine to Sit     Supine to sit: Min assist     General bed mobility comments: good effort with HOB elevated and minor help to RLE toside of bed    Transfers Overall transfer level: Needs assistance Equipment used: Rolling walker (2 wheels), 1 person hand held assist Transfers: Sit to/from Stand Sit to Stand: Min guard           General transfer comment: min guard to steady and then pt captures her control of RLE and wb on it    Ambulation/Gait Ambulation/Gait assistance: Min guard Gait Distance (Feet): 160 Feet (130+30) Assistive device: Rolling walker (2 wheels) Gait Pattern/deviations: Decreased stride length, Step-through pattern, Wide base of support, Decreased weight shift to right Gait velocity: reduced Gait velocity interpretation: <1.31 ft/sec, indicative of household ambulator Pre-gait activities: standing balance and wgt balance General Gait Details: pt is taking her time with reduction in stance time RLE but good reciprocal pattern emerging with practice  Stairs Stairs:  (deferred)          Wheelchair Mobility    Modified Rankin (Stroke Patients Only)       Balance Overall balance assessment: Needs assistance Sitting-balance support: Feet supported Sitting balance-Leahy Scale: Good  Standing balance support: Bilateral upper extremity supported, During functional activity Standing balance-Leahy Scale: Poor                               Pertinent Vitals/Pain  Pain Assessment Pain Assessment: Faces Faces Pain Scale: Hurts little more Pain Location: R knee with mobility Pain Descriptors / Indicators: Aching, Guarding Pain Intervention(s): Limited activity within patient's tolerance, Monitored during session, Premedicated before session, Repositioned, Ice applied (refilled polarcare)    Home Living Family/patient expects to be discharged to:: Private residence Living Arrangements: Spouse/significant other Available Help at Discharge: Family Type of Home: House Home Access: Stairs to enter Entrance Stairs-Rails: None Entrance Stairs-Number of Steps: 1+2 Alternate Level Stairs-Number of Steps: 12 (6+6) Home Layout: Two level Home Equipment: Tub bench;Hospital bed Additional Comments: pt reports she has RW but is broken, needs 3 in one    Prior Function Prior Level of Function : Independent/Modified Independent             Mobility Comments: has been independent walking but avoided her indoor stairs       Hand Dominance   Dominant Hand: Right    Extremity/Trunk Assessment   Upper Extremity Assessment Upper Extremity Assessment: Overall WFL for tasks assessed    Lower Extremity Assessment Lower Extremity Assessment: RLE deficits/detail RLE Deficits / Details: RLE in immobilizer with ice RLE: Unable to fully assess due to immobilization RLE Coordination: decreased gross motor    Cervical / Trunk Assessment Cervical / Trunk Assessment: Back Surgery (history)  Communication   Communication: No difficulties  Cognition Arousal/Alertness: Awake/alert Behavior During Therapy: WFL for tasks assessed/performed Overall Cognitive Status: Within Functional Limits for tasks assessed                                          General Comments General comments (skin integrity, edema, etc.): Pt was assisted to balance and get her control of RLE with pt having some minor discomfort addressed by refilling polar care and  elevation.  Pt is medicated and managed for tx session    Exercises     Assessment/Plan    PT Assessment Patient needs continued PT services  PT Problem List Decreased strength;Decreased range of motion;Decreased activity tolerance;Decreased balance;Decreased mobility;Decreased coordination;Decreased skin integrity;Pain       PT Treatment Interventions DME instruction;Gait training;Functional mobility training;Therapeutic activities;Therapeutic exercise;Balance training;Neuromuscular re-education;Patient/family education;Stair training    PT Goals (Current goals can be found in the Care Plan section)  Acute Rehab PT Goals Patient Stated Goal: to get home and return to normal walking PT Goal Formulation: With patient Time For Goal Achievement: 05/27/22 Potential to Achieve Goals: Good    Frequency 7X/week     Co-evaluation               AM-PAC PT "6 Clicks" Mobility  Outcome Measure Help needed turning from your back to your side while in a flat bed without using bedrails?: A Little Help needed moving from lying on your back to sitting on the side of a flat bed without using bedrails?: A Little Help needed moving to and from a bed to a chair (including a wheelchair)?: A Little Help needed standing up from a chair using your arms (e.g., wheelchair or bedside chair)?: A Little Help needed to walk in hospital room?: A Little Help needed climbing 3-5  steps with a railing? : A Lot 6 Click Score: 17    End of Session Equipment Utilized During Treatment: Gait belt Activity Tolerance: Patient limited by fatigue;Treatment limited secondary to medical complications (Comment) Patient left: in chair;with call bell/phone within reach;with chair alarm set Nurse Communication: Mobility status PT Visit Diagnosis: Unsteadiness on feet (R26.81);Pain;Difficulty in walking, not elsewhere classified (R26.2) Pain - Right/Left: Right Pain - part of body: Knee    Time: 7703-4035 PT Time  Calculation (min) (ACUTE ONLY): 31 min   Charges:   PT Evaluation $PT Eval Moderate Complexity: 1 Mod PT Treatments $Gait Training: 8-22 mins       Ivar Drape 05/13/2022, 3:08 PM  Samul Dada, PT PhD Acute Rehab Dept. Number: [facility] [id number] and [facility] [phone]

## 2022-05-13 NOTE — TOC Initial Note (Addendum)
Transition of Care [facility](TOC) - Initial/Assessment Note    Patient Details  Name: Stacy [name] MRN: [id number] Date of Birth: [birth year]  Transition of Care [facility](TOC) CM/SW Contact:    Epifanio Leschesole, [place] Eppard Hudson, RN Phone Number: 05/13/2022, 12:34 PM  Clinical Narrative:                      - s/p R total knee revision arthroplasty,4/9 From home with husband. Independent with ADL's  PTA. States husband to assist with care once d/c. Order noted for home health and DME needs. Pt agreeable to both. Pt without provider preference. [facility] by providers office. NCM f/u with Amy with [facility]. Per Amy case being discussed with director, acceptance pending.  Referral made with [facility] [employer] [facility] RW, BSC. Equipment will be delivered to bedside prior to d/c. Pt without transportation to home. States husband working on securing transportation to home.  TOC team following and will assist with needs....   Late entry 05/15/2022 1430 [facility] unable to accept. [facility] and will provided home health services, pt made aware,   Expected Discharge Plan: Home w Home Health Services Barriers to Discharge: Continued Medical Work up   Patient Goals and CMS Choice            Expected Discharge Plan and Services   Discharge Planning Services: CM Consult   Living arrangements for the past 2 months: Single Family Home                 DME Arranged: Bedside commode, Walker rolling DME Agency: [facility] Date DME Agency Contacted: 05/13/22 Time DME Agency Contacted: 1223 Representative spoke with at DME Agency: [facility]   [facility]: 05/13/22 Time HH Agency Contacted: 1233 [facility]  Prior Living Arrangements/Services Living arrangements for the past 2 months: Single Family Home Lives with:: Spouse Patient language and need for interpreter reviewed:: Yes Do  you feel safe going back to the place where you live?: Yes      Need for Family Participation in Patient Care: Yes (Comment) Care giver support system in place?: Yes (comment)   Criminal Activity/Legal Involvement Pertinent to Current Situation/Hospitalization: No - Comment as needed  Activities of Daily Living      Permission Sought/Granted   Permission granted to share information with : Yes, Verbal Permission Granted  Share Information with NAME: Theresia BoughCharles Bellomo  Spouse  [phone]           Emotional Assessment Appearance:: Appears stated age Attitude/Demeanor/Rapport: Engaged Affect (typically observed): Accepting Orientation: : Oriented to Self, Oriented to Place, Oriented to  Time, Oriented to Situation Alcohol / Substance Use: Not Applicable Psych Involvement: No (comment)  Admission diagnosis:  Status post revision of total replacement of right knee [Z96.651] Patient Active Problem List   Diagnosis Date Noted   Status post revision of total replacement of right knee 05/12/2022   Personal history of colonic polyps 03/20/2022   Failed total knee, right, subsequent encounter 03/02/2022   Loose right total knee arthroplasty 03/02/2022   Mixed stress and urge urinary incontinence 12/04/2021   Functional fecal incontinence 12/04/2021   IBS (irritable bowel syndrome) 11/19/2021   Spondylolisthesis, lumbar region    Other spondylosis with radiculopathy, lumbar region    Other secondary scoliosis, lumbar region    Fusion of spine of lumbar region 03/25/2021   Colon polyps  06/06/2020   Lupus 06/06/2020   Allergic rhinitis due to animal (cat) (dog) hair and dander 04/08/2020   Allergic rhinitis due to pollen 04/08/2020   Food allergy 04/08/2020   Acute medial meniscus tear, left, subsequent encounter 03/21/2020   Chronic pain of left knee 02/15/2020   Paresthesia of skin 11/16/2019   History of total knee replacement, right 11/16/2019   Tobacco abuse 11/16/2019    Centrilobular emphysema 08/10/2019   Incidental lung nodule, > 37mm and < 66mm 08/10/2019   OSA on CPAP 08/10/2019   Dyspnea on exertion 08/02/2019   Hyperlipidemia 08/02/2019   Lumbar radiculopathy 04/24/2019   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   Post laminectomy syndrome 02/23/2019   Abnormality of gait 02/23/2019   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   Unilateral primary osteoarthritis, left hip 12/28/2018   Lesion of skin of left ear 12/26/2018   Primary osteoarthritis of left hip 12/02/2018   Iron deficiency anemia 10/16/2018   Chronic pain syndrome 09/08/2018   Chronic pain of right knee 08/11/2018   Status post lumbar laminectomy 07/15/2018   Peripheral arterial disease 04/05/2018   Moderate persistent asthma without complication 06/29/2017   Environmental and seasonal allergies 06/29/2017   Controlled type 2 diabetes mellitus with diabetic polyneuropathy, without long-term current use of insulin 06/29/2017   Perennial allergic rhinitis 04/08/2017   Sensorineural hearing loss (SNHL), bilateral 04/08/2017   Chronic pansinusitis 03/25/2017   Eustachian tube dysfunction, bilateral 03/25/2017   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   Herniation of lumbar intervertebral disc with radiculopathy 10/02/2016    Class: Chronic   Alopecia areata 08/19/2016   Chondromalacia of both patellae 06/03/2015    Class: Chronic   Spinal stenosis, lumbar region, with neurogenic claudication 06/03/2015   Tobacco use disorder 04/25/2015   DJD (degenerative joint disease) of knee 01/04/2015   Hemorrhoid 11/14/2014   Gout of big toe 07/19/2014   Essential hypertension 08/14/2013   Gastroesophageal reflux disease without esophagitis 08/14/2013   COPD (chronic obstructive pulmonary disease) 04/17/2011   PCP:  Marcine Matar, MD Pharmacy:   [facility] - [place], [place] - [address] [place] [address] Phone: [phone] Fax:  [phone]  [id number] [place], [place] - [address] AT [facility] & [address] [place] [address] Phone: [phone] Fax: [phone]     Social Determinants of Health (SDOH) Social History: SDOH Screenings   Food Insecurity: No Food Insecurity (03/09/2022)  Housing: Low Risk  (03/09/2022)  Transportation Needs: No Transportation Needs (03/09/2022)  Utilities: Not At Risk (11/25/2021)  Alcohol Screen: Low Risk  (01/07/2022)  Depression (PHQ2-9): Medium Risk (05/07/2022)  Tobacco Use: Medium Risk (05/13/2022)   SDOH Interventions:     Readmission Risk Interventions     No data to display

## 2022-05-13 NOTE — Anesthesia Postprocedure Evaluation (Signed)
Anesthesia Post Note  Patient: Stacy [name]  Procedure(s) Performed: RIGHT TOTAL KNEE REVISION ARTHROPLASTY (Right: Knee)     Patient location during evaluation: PACU Anesthesia Type: General Level of consciousness: awake and alert Pain management: pain level controlled Vital Signs Assessment: post-procedure vital signs reviewed and stable Respiratory status: spontaneous breathing, nonlabored ventilation, respiratory function stable and patient connected to nasal cannula oxygen Cardiovascular status: blood pressure returned to baseline and stable Postop Assessment: no apparent nausea or vomiting Anesthetic complications: no   No notable events documented.  Last Vitals:  Vitals:   05/13/22 0818 05/13/22 0912  BP: (!) 117/97   Pulse: 75   Resp: 18   Temp: 37.2 C   SpO2: 91% 91%    Last Pain:  Vitals:   05/13/22 0818  TempSrc: Oral  PainSc:                  Kennieth Rad

## 2022-05-13 NOTE — Progress Notes (Signed)
Subjective: 1 Day Post-Op Procedure(s) (LRB): RIGHT TOTAL KNEE REVISION ARTHROPLASTY (Right) Patient reports pain as moderate.    Objective: Vital signs in last 24 hours: Temp:  [98.2 F (36.8 C)-99.6 F (37.6 C)] 98.9 F (37.2 C) (04/10 0818) Pulse Rate:  [53-96] 75 (04/10 0818) Resp:  [9-22] 18 (04/10 0818) BP: (106-156)/(62-107) 117/97 (04/10 0818) SpO2:  [91 %-100 %] 91 % (04/10 0818)  Intake/Output from previous day: 04/09 0701 - 04/10 0700 In: 450 [IV Piggyback:450] Out: 100 [Blood:100] Intake/Output this shift: No intake/output data recorded.  Recent Labs    05/13/22 0153  HGB 10.4*   Recent Labs    05/13/22 0153  WBC 11.1*  RBC 3.85*  HCT 31.1*  PLT 262   Recent Labs    05/13/22 0153  NA 134*  K 3.8  CL 98  CO2 26  BUN 15  CREATININE 0.78  GLUCOSE 163*  CALCIUM 9.0   No results for input(s): "LABPT", "INR" in the last 72 hours.  Sensation intact distally Intact pulses distally Dorsiflexion/Plantar flexion intact Incision: dressing C/D/I Compartment soft   Assessment/Plan: 1 Day Post-Op Procedure(s) (LRB): RIGHT TOTAL KNEE REVISION ARTHROPLASTY (Right) Up with therapy      [name] 05/13/2022, 8:33 AM

## 2022-05-14 ENCOUNTER — Telehealth: Payer: Self-pay | Admitting: Orthopaedic Surgery

## 2022-05-14 LAB — GLUCOSE, CAPILLARY: Glucose-Capillary: 150 mg/dL — ABNORMAL HIGH (ref 70–99)

## 2022-05-14 MED ORDER — METHOCARBAMOL 500 MG PO TABS: 500.0000 mg | ORAL_TABLET | Freq: Four times a day (QID) | ORAL | 1 refills | Status: DC | PRN

## 2022-05-14 MED ORDER — OXYCODONE HCL 5 MG PO TABS: 5.0000 mg | ORAL_TABLET | ORAL | 0 refills | Status: DC | PRN

## 2022-05-14 NOTE — Telephone Encounter (Signed)
[facility] asking for clinical notes advising the reason for replacement for hospital bed mattress. Please call [phone])

## 2022-05-14 NOTE — Discharge Summary (Signed)
Patient ID: Stacy [name] MRN: [id number] DOB/AGE: 1961-01-15 62 y.o.  Admit date: 05/12/2022 Discharge date: 05/14/2022  Admission Diagnoses:  Principal Problem:   Failed total knee, right, subsequent encounter Active Problems:   Status post revision of total replacement of right knee   Discharge Diagnoses:  Same  Past Medical History:  Diagnosis Date   Allergy    Shellfish, cleaning products   Anxiety    Arthritis    Arthrofibrosis of total knee replacement    right   Asthma    COPD (chronic obstructive pulmonary disease)    Depression    Diabetes mellitus    Type II   GERD (gastroesophageal reflux disease)    Pt on Protonix daily   Glaucoma    Gout    Headache(784.0)    otc meds prn   Hyperlipidemia    Hypertension    Irritable bowel syndrome 11/19/2010   Neuropathy    Pneumonia YRS AGO   Restless legs    Shortness of breath    07/14/2018- uses  4 times a day   Sleep apnea     Surgeries: Procedure(s): RIGHT TOTAL KNEE REVISION ARTHROPLASTY on 05/12/2022   Consultants:   Discharged Condition: Improved  Hospital Course: Stacy [name] is an 62 y.o. female who was admitted 05/12/2022 for operative treatment ofFailed total knee, right, subsequent encounter. Patient has severe unremitting pain that affects sleep, daily activities, and work/hobbies. After pre-op clearance the patient was taken to the operating room on 05/12/2022 and underwent  Procedure(s): RIGHT TOTAL KNEE REVISION ARTHROPLASTY.    Patient was given perioperative antibiotics:  Anti-infectives (From admission, onward)    Start     Dose/Rate Route Frequency Ordered Stop   05/12/22 2130  ceFAZolin (ANCEF) IVPB 1 g/50 mL premix        1 g 100 mL/hr over 30 Minutes Intravenous Every 6 hours 05/12/22 1901 05/13/22 0218   05/12/22 1116  ceFAZolin (ANCEF) 2-4 GM/100ML-% IVPB       Note to Pharmacy: [facility], [place]: cabinet override      05/12/22 1116 05/12/22 1529   05/12/22 1115   ceFAZolin (ANCEF) IVPB 2g/100 mL premix        2 g 200 mL/hr over 30 Minutes Intravenous On call to O.R. 05/12/22 1112 05/12/22 1522        Patient was given sequential compression devices, early ambulation, and chemoprophylaxis to prevent DVT.  Patient benefited maximally from hospital stay and there were no complications.    Recent vital signs: Patient Vitals for the past 24 hrs:  BP Temp Temp src Pulse Resp SpO2  05/14/22 0805 139/87 98.6 F (37 C) Oral 90 18 --  05/14/22 0511 -- 98.9 F (37.2 C) Oral -- -- --  05/14/22 0405 137/75 100.1 F (37.8 C) Oral 95 -- 93 %  05/13/22 2001 110/71 97.6 F (36.4 C) Oral 80 18 100 %  05/13/22 1949 (!) (P) 140/77 (P) 98.9 F (37.2 C) (P) Oral (P) 92 (P) 18 (P) 93 %  05/13/22 1937 -- -- -- -- -- 93 %  05/13/22 1412 (!) 157/67 99 F (37.2 C) Oral 72 18 92 %     Recent laboratory studies:  Recent Labs    05/13/22 0153  WBC 11.1*  HGB 10.4*  HCT 31.1*  PLT 262  NA 134*  K 3.8  CL 98  CO2 26  BUN 15  CREATININE 0.78  GLUCOSE 163*  CALCIUM 9.0     Discharge Medications:  Allergies as of 05/14/2022       Reactions   Other Shortness Of Breath   UNSPECIFIED AGENTS Allergic to perfumes and cleaning products   Shellfish Allergy Anaphylaxis   Per allergy test.   Ace Inhibitors Cough, Other (See Comments)      Celebrex [celecoxib]     upset stomach  Pt is taking med*   Aspirin Nausea Only   stomach upset        Medication List     TAKE these medications    Accu-Chek Aviva Plus test strip Generic drug: glucose blood Use as instructed   Accu-Chek Aviva Plus w/Device Kit 1 each by Does not apply route 3 (three) times daily.   accu-chek soft touch lancets Use as instructed   accu-chek multiclix lancets Use as instructed   albuterol (2.5 MG/3ML) 0.083% nebulizer solution Commonly known as: PROVENTIL USE ONE VIAL (2.5 MG TOTAL) BY NEBULIZATION EVERY 6 (SIX) HOURS AS NEEDED FOR WHEEZING.   allopurinol 100 MG  tablet Commonly known as: ZYLOPRIM TAKE 1 TABLET (100 MG TOTAL) BY MOUTH DAILY.(AM)   atorvastatin 40 MG tablet Commonly known as: LIPITOR TAKE 1 TABLET (40 MG TOTAL) BY MOUTH DAILY. (BEDTIME)   celecoxib 200 MG capsule Commonly known as: CELEBREX Take 200 mg by mouth 2 (two) times daily.   CENTRUM SILVER 50+WOMEN PO Take 1 tablet by mouth daily.   clopidogrel 75 MG tablet Commonly known as: PLAVIX TAKE 1 TABLET (75 MG TOTAL) BY MOUTH DAILY. (AM)   colchicine 0.6 MG tablet 2 tabs PO today then 1 tab PO daily x 4 days   dapagliflozin propanediol 5 MG Tabs tablet Commonly known as: Farxiga Take 1 tablet (5 mg total) by mouth daily before breakfast.   diclofenac Sodium 1 % Gel Commonly known as: VOLTAREN Apply 2 g topically 4 (four) times daily. What changed:  when to take this reasons to take this   DULoxetine 60 MG capsule Commonly known as: CYMBALTA TAKE 1 CAPSULE (60 MG TOTAL) BY MOUTH DAILY (AM)   EPINEPHrine 0.3 mg/0.3 mL Soaj injection Commonly known as: EPI-PEN 0.3 mg IM x 1 PRN for allergic reaction   [name] 325 (65 FE) MG tablet Generic drug: ferrous sulfate TAKE ONE TABLET ( 325 MG ) BY MOUTH DAILY (AM)   fluticasone 50 MCG/ACT nasal spray Commonly known as: FLONASE Place 1 spray into both nostrils daily.   Flutter Devi Use after breathing treatment 4 times daily   Glucagon 1 MG/0.2ML Sosy 1 mg arrange subcutaneously as needed for severe symptomatic low blood sugar   glycopyrrolate 2 MG tablet Commonly known as: ROBINUL Take 2 mg by mouth 3 (three) times daily.   hydrOXYzine 10 MG tablet Commonly known as: ATARAX TAKE 1 TABLET IN THE MORNING AND NOON AND 2 TABLETS IN THE EVENING AS NEEDED. What changed: See the new instructions.   loratadine 10 MG tablet Commonly known as: CLARITIN TAKE 1 TABLET (10 MG TOTAL) BY MOUTH DAILY. (AM)   melatonin 5 MG Tabs Take 5 mg by mouth at bedtime.   metFORMIN 750 MG 24 hr tablet Commonly known as:  GLUCOPHAGE-XR Take 1 tablet (750 mg total) by mouth daily with breakfast.   methocarbamol 500 MG tablet Commonly known as: ROBAXIN Take 1 tablet (500 mg total) by mouth every 6 (six) hours as needed for muscle spasms.   montelukast 10 MG tablet Commonly known as: SINGULAIR Take 10 mg by mouth daily.   oxyCODONE 5 MG immediate release tablet Commonly known  as: Oxy IR/ROXICODONE Take 1-2 tablets (5-10 mg total) by mouth every 4 (four) hours as needed for moderate pain (pain score 4-6).   pantoprazole 40 MG tablet Commonly known as: PROTONIX Take 40 mg by mouth daily.   potassium chloride SA 20 MEQ tablet Commonly known as: KLOR-CON M TAKE ONE TABLET BY MOUTH ONCE DAILY (NOON)   Spiriva HandiHaler 18 MCG inhalation capsule Generic drug: tiotropium PLACE 1 CAPSULE (18 MCG TOTAL) INTO INHALER AND INHALE DAILY.   Symbicort 160-4.5 MCG/ACT inhaler Generic drug: budesonide-formoterol Inhale 1 puff into the lungs 2 (two) times daily.   triamcinolone cream 0.1 % Commonly known as: KENALOG Apply 1 application topically 2 (two) times daily. What changed:  when to take this reasons to take this   valsartan-hydrochlorothiazide 160-12.5 MG tablet Commonly known as: DIOVAN-HCT TAKE ONE TABLET BY MOUTH ONCE DAILY (AM)               Durable Medical Equipment  (From admission, onward)           Start     Ordered   05/12/22 1902  DME 3 n 1  Once        05/12/22 1901   05/12/22 1902  DME Walker rolling  Once       Question Answer Comment  Walker: With 5 Inch Wheels   Patient needs a walker to treat with the following condition Status post revision of total replacement of right knee      05/12/22 1901            Diagnostic Studies: DG Knee Right Port  Result Date: 05/12/2022 CLINICAL DATA:  [id number] Status post revision of total replacement of right knee [id number] EXAM: PORTABLE RIGHT KNEE - 1-2 VIEW COMPARISON:  Knee radiograph 12/31/2021 FINDINGS: Postsurgical  changes of right knee arthroplasty revision with constrained long-stem arthroplasty. No evidence of loosening or fracture. Expected soft tissue changes. IMPRESSION: Postsurgical changes of right knee arthroplasty revision. No evidence of immediate hardware complication. Electronically Signed   By: Caprice RenshawJacob  Kahn M.D.   On: 05/12/2022 18:48   DG Foot Complete Right  Result Date: 05/10/2022 CLINICAL DATA:  Pain EXAM: RIGHT FOOT COMPLETE - 3+ VIEW COMPARISON:  None Available. FINDINGS: There is no evidence of fracture or dislocation. There is no evidence of arthropathy or other focal bone abnormality. Soft tissues are unremarkable. Large plantar calcaneal spur noted. Small posterior calcaneal spur noted. IMPRESSION: Small posterior and large plantar calcaneal spurs. No acute osseous abnormalities. Electronically Signed   By: Layla MawJoshua  Pleasure M.D.   On: 05/10/2022 09:47    Disposition: Discharge disposition: 06-Home-Health Care Svc          Follow-up Information     Marcine MatarJohnson, Deborah B, MD Follow up.   Specialty: Internal Medicine Contact information: [address] [place] [address] [phone]         Kathryne HitchBlackman, Christopher Y, MD Follow up in 2 week(s).   Specialty: Orthopedic Surgery Contact information: [address] [place] [address] [phone]                  Signed: Richardean CanalGILBERT Kamareon Sciandra 05/14/2022, 12:19 PM

## 2022-05-14 NOTE — Telephone Encounter (Signed)
Faxed to [facility] 

## 2022-05-14 NOTE — Progress Notes (Addendum)
Subjective: 2 Days Post-Op Procedure(s) (LRB): RIGHT TOTAL KNEE REVISION ARTHROPLASTY (Right) Patient reports pain as moderate.   Walking with PT in hallway.  Objective: Vital signs in last 24 hours: Temp:  [97.6 F (36.4 C)-100.1 F (37.8 C)] 98.6 F (37 C) (04/11 0805) Pulse Rate:  [72-95] 90 (04/11 0805) Resp:  [18] 18 (04/11 0805) BP: (110-157)/(67-87) 139/87 (04/11 0805) SpO2:  [92 %-100 %] 93 % (04/11 0405)  Intake/Output from previous day: 04/10 0701 - 04/11 0700 In: 1360 [P.O.:360; I.V.:1000] Out: 0  Intake/Output this shift: Total I/O In: 360 [P.O.:360] Out: -   Recent Labs    05/13/22 0153  HGB 10.4*   Recent Labs    05/13/22 0153  WBC 11.1*  RBC 3.85*  HCT 31.1*  PLT 262   Recent Labs    05/13/22 0153  NA 134*  K 3.8  CL 98  CO2 26  BUN 15  CREATININE 0.78  GLUCOSE 163*  CALCIUM 9.0   No results for input(s): "LABPT", "INR" in the last 72 hours.  Dorsiflexion/Plantar flexion intact Incision: dressing C/D/I Compartment soft   Assessment/Plan: 2 Days Post-Op Procedure(s) (LRB): RIGHT TOTAL KNEE REVISION ARTHROPLASTY (Right) Discharge home with home health if patient remains stable for discharge.  Wean oxygen for SAT >88% room air.      Tania Steinhauser 05/14/2022, 11:49 AM

## 2022-05-14 NOTE — Discharge Instructions (Signed)

## 2022-05-14 NOTE — Progress Notes (Signed)
Physical Therapy Treatment Patient Details Name: Stacy [name] [facility] MRN: [id number] DOB: [birth year] Today's Date: 05/14/2022   History of Present Illness 62 yo female with at least a year of instability and issues on R TKA was admitted on 4/9 for revision of all components on R knee.  Her joint is WBAT with movement permitted, in immobilizer at eval.  PMHx:  OA, R TKA, emphysema, lumbar fusion, IBS, incontinence of urine, lung nodule, lumbar radiculopathy, spinal stenosis, COPD, gout    PT Comments    Pt was received in supine and agreeable to session focused on progressing gait distance and performing stair trial. Pt was able to tolerate increased gait distance with 2 seated rest breaks and min guard for safety. Pt performing 2 stair trials with min A due to RLE pain and weakness. Pt ambulating 39ft and to and from the bathroom on RA with SpO2 as low as 88%, quickly improving to >93% with pursed lip breathing. Anticipate pt and family will be able to manage pt's mobility needs at home.    Recommendations for follow up therapy are one component of a multi-disciplinary discharge planning process, led by the attending physician.  Recommendations may be updated based on patient status, additional functional criteria and insurance authorization.  Follow Up Recommendations       Assistance Recommended at Discharge Intermittent Supervision/Assistance  Patient can return home with the following A little help with walking and/or transfers;A little help with bathing/dressing/bathroom;Assistance with cooking/housework;Assist for transportation;Help with stairs or ramp for entrance   Equipment Recommendations  Rolling walker (2 wheels);BSC/3in1    Recommendations for Other Services       Precautions / Restrictions Precautions Precautions: Knee Precaution Booklet Issued: No Precaution Comments: reviewed with pt Required Braces or Orthoses: Knee Immobilizer - Right Knee Immobilizer - Right:  Discontinue once straight leg raise with < 10 degree lag Restrictions Weight Bearing Restrictions: Yes RLE Weight Bearing: Weight bearing as tolerated     Mobility  Bed Mobility Overal bed mobility: Needs Assistance Bed Mobility: Supine to Sit     Supine to sit: Min guard, HOB elevated     General bed mobility comments: Pt able to scoot EOB without assist. Cues for technique    Transfers Overall transfer level: Needs assistance Equipment used: Rolling walker (2 wheels) Transfers: Sit to/from Stand Sit to Stand: Min guard           General transfer comment: from low EOB, bench, and recliner with min guard for safety and cues for hand placement    Ambulation/Gait Ambulation/Gait assistance: Min guard Gait Distance (Feet): 250 Feet Assistive device: Rolling walker (2 wheels) Gait Pattern/deviations: Step-to pattern, Step-through pattern, Decreased stride length, Decreased stance time - right       General Gait Details: slow step-to pattern with intermittent step-through with cues. Heavy reliance on BUEs to reduce RLE WB. Min guard for safety.   Stairs Stairs: Yes Stairs assistance: Min assist Stair Management: One rail Left, Forwards Number of Stairs: 1 (x2) General stair comments: HHA on R with min A for balance due to RLE pain and slight buckling. Cues for sequence and technique.       Balance Overall balance assessment: Needs assistance Sitting-balance support: Feet supported Sitting balance-Leahy Scale: Good Sitting balance - Comments: sitting EOB   Standing balance support: Bilateral upper extremity supported, During functional activity, Reliant on assistive device for balance Standing balance-Leahy Scale: Poor Standing balance comment: with RW support  Cognition Arousal/Alertness: Awake/alert Behavior During Therapy: WFL for tasks assessed/performed Overall Cognitive Status: Within Functional Limits for tasks  assessed                                          Exercises Total Joint Exercises Straight Leg Raises: AROM, Supine, 5 reps, Right    General Comments General comments (skin integrity, edema, etc.): Pt able to perform SLR x3, so KI was not donned for ambulation and no overt buckling noted.      Pertinent Vitals/Pain Pain Assessment Pain Assessment: 0-10 Pain Score: 6  Pain Location: R knee with mobility Pain Descriptors / Indicators: Aching, Guarding Pain Intervention(s): Limited activity within patient's tolerance, Monitored during session     PT Goals (current goals can now be found in the care plan section) Acute Rehab PT Goals Patient Stated Goal: to get home and return to normal walking PT Goal Formulation: With patient Time For Goal Achievement: 05/27/22 Potential to Achieve Goals: Good Progress towards PT goals: Progressing toward goals    Frequency    7X/week      PT Plan Current plan remains appropriate       AM-PAC PT "6 Clicks" Mobility   Outcome Measure  Help needed turning from your back to your side while in a flat bed without using bedrails?: A Little Help needed moving from lying on your back to sitting on the side of a flat bed without using bedrails?: A Little Help needed moving to and from a bed to a chair (including a wheelchair)?: A Little Help needed standing up from a chair using your arms (e.g., wheelchair or bedside chair)?: A Little Help needed to walk in hospital room?: A Little Help needed climbing 3-5 steps with a railing? : A Little 6 Click Score: 18    End of Session Equipment Utilized During Treatment: Gait belt Activity Tolerance: Patient tolerated treatment well Patient left: in chair;with call bell/phone within reach;with chair alarm set Nurse Communication: Mobility status PT Visit Diagnosis: Unsteadiness on feet (R26.81);Pain;Difficulty in walking, not elsewhere classified (R26.2)     Time:  2585-2778 PT Time Calculation (min) (ACUTE ONLY): 53 min  Charges:  $Gait Training: 53-67 mins                     Johny Shock, PTA Acute Rehabilitation Services Secure Chat Preferred  [phone]    Johny Shock 05/14/2022, 12:58 PM

## 2022-05-15 ENCOUNTER — Telehealth: Payer: Self-pay

## 2022-05-15 NOTE — Transitions of Care (Post Inpatient/ED Visit) (Signed)
   05/15/2022  Name: Stacy [name] Stell MRN: [id number] DOB: [birth year]  Today's TOC FU Call Status: Today's TOC FU Call Status:: Successful TOC FU Call Competed Unsuccessful Call (1st Attempt) Date: 05/15/22 [facility] FU Call Complete Date: 05/15/22  Transition Care Management Follow-up Telephone Call Date of Discharge: 05/14/22 Discharge Facility: [facility]) Type of Discharge: Inpatient Admission How have you been since you were released from the hospital?: Better Any questions or concerns?: No  Items Reviewed: Did you receive and understand the discharge instructions provided?: Yes Any new allergies since your discharge?: No Dietary orders reviewed?: No Do you have support at home?: Yes  Home Care and Equipment/Supplies: Were Home Health Services Ordered?: Yes Name of Home Health Agency:: Unknown Has Agency set up a time to come to your home?: No Any new equipment or medical supplies ordered?: Yes Name of Medical supply agency?: rolling walker and potty chair Were you able to get the equipment/medical supplies?: Yes Do you have any questions related to the use of the equipment/supplies?: No  Functional Questionnaire: Do you need assistance with bathing/showering or dressing?: Yes Do you need assistance with meal preparation?: No Do you need assistance with eating?: No Do you have difficulty maintaining continence: No Do you need assistance with getting out of bed/getting out of a chair/moving?: No Do you have difficulty managing or taking your medications?: No  Follow up appointments reviewed: PCP Follow-up appointment confirmed?: Yes Date of PCP follow-up appointment?: 05/27/22 Do you need transportation to your follow-up appointment?: No Do you understand care options if your condition(s) worsen?: Yes-patient verbalized understanding   SDOH Interventions    Flowsheet Row Patient Outreach Telephone from 03/09/2022 in [place] [facility] DEPARTMENT Patient  Outreach Telephone from 01/07/2022 in [place] POPULATION HEALTH DEPARTMENT Patient Outreach Telephone from 10/21/2021 in [facility] Community Care Coordination  SDOH Interventions     Food Insecurity Interventions Intervention Not Indicated -- --  Housing Interventions -- -- Intervention Not Indicated  Transportation Interventions Intervention Not Indicated -- Intervention Not Indicated  Alcohol Usage Interventions -- Intervention Not Indicated (Score <7) --        [name], BSW, MHA [facility]  Managed [facility] Social Worker [phone]

## 2022-05-15 NOTE — Transitions of Care (Post Inpatient/ED Visit) (Signed)
   05/15/2022  Name: Stacy [name] MRN: [id number] DOB: 02-26-1960  Today's TOC FU Call Status: Today's TOC FU Call Status:: Unsuccessul Call (1st Attempt) Unsuccessful Call (1st Attempt) Date: 05/15/22  Attempted to reach the patient regarding the most recent Inpatient/ED visit.  Follow Up Plan: Additional outreach attempts will be made to reach the patient to complete the Transitions of Care (Post Inpatient/ED visit) call.   Abelino Derrick, MHA [facility]  Managed [facility] Social Worker [phone]

## 2022-05-18 DIAGNOSIS — M109 Gout, unspecified: Secondary | ICD-10-CM | POA: Diagnosis not present

## 2022-05-18 DIAGNOSIS — M25562 Pain in left knee: Secondary | ICD-10-CM | POA: Diagnosis not present

## 2022-05-18 DIAGNOSIS — D509 Iron deficiency anemia, unspecified: Secondary | ICD-10-CM | POA: Diagnosis not present

## 2022-05-18 DIAGNOSIS — L639 Alopecia areata, unspecified: Secondary | ICD-10-CM | POA: Diagnosis not present

## 2022-05-18 DIAGNOSIS — G4733 Obstructive sleep apnea (adult) (pediatric): Secondary | ICD-10-CM | POA: Diagnosis not present

## 2022-05-18 DIAGNOSIS — K219 Gastro-esophageal reflux disease without esophagitis: Secondary | ICD-10-CM | POA: Diagnosis not present

## 2022-05-18 DIAGNOSIS — J4489 Other specified chronic obstructive pulmonary disease: Secondary | ICD-10-CM | POA: Diagnosis not present

## 2022-05-18 DIAGNOSIS — M5116 Intervertebral disc disorders with radiculopathy, lumbar region: Secondary | ICD-10-CM | POA: Diagnosis not present

## 2022-05-18 DIAGNOSIS — J4540 Moderate persistent asthma, uncomplicated: Secondary | ICD-10-CM | POA: Diagnosis not present

## 2022-05-18 DIAGNOSIS — M329 Systemic lupus erythematosus, unspecified: Secondary | ICD-10-CM | POA: Diagnosis not present

## 2022-05-18 DIAGNOSIS — M4726 Other spondylosis with radiculopathy, lumbar region: Secondary | ICD-10-CM | POA: Diagnosis not present

## 2022-05-18 DIAGNOSIS — F419 Anxiety disorder, unspecified: Secondary | ICD-10-CM | POA: Diagnosis not present

## 2022-05-18 DIAGNOSIS — J432 Centrilobular emphysema: Secondary | ICD-10-CM | POA: Diagnosis not present

## 2022-05-18 DIAGNOSIS — T84032D Mechanical loosening of internal right knee prosthetic joint, subsequent encounter: Secondary | ICD-10-CM | POA: Diagnosis not present

## 2022-05-18 DIAGNOSIS — M4156 Other secondary scoliosis, lumbar region: Secondary | ICD-10-CM | POA: Diagnosis not present

## 2022-05-18 DIAGNOSIS — F32A Depression, unspecified: Secondary | ICD-10-CM | POA: Diagnosis not present

## 2022-05-18 DIAGNOSIS — E1151 Type 2 diabetes mellitus with diabetic peripheral angiopathy without gangrene: Secondary | ICD-10-CM | POA: Diagnosis not present

## 2022-05-18 DIAGNOSIS — M4316 Spondylolisthesis, lumbar region: Secondary | ICD-10-CM | POA: Diagnosis not present

## 2022-05-18 DIAGNOSIS — M25561 Pain in right knee: Secondary | ICD-10-CM | POA: Diagnosis not present

## 2022-05-18 DIAGNOSIS — M48061 Spinal stenosis, lumbar region without neurogenic claudication: Secondary | ICD-10-CM | POA: Diagnosis not present

## 2022-05-18 DIAGNOSIS — G8929 Other chronic pain: Secondary | ICD-10-CM | POA: Diagnosis not present

## 2022-05-18 DIAGNOSIS — G2581 Restless legs syndrome: Secondary | ICD-10-CM | POA: Diagnosis not present

## 2022-05-18 DIAGNOSIS — K589 Irritable bowel syndrome without diarrhea: Secondary | ICD-10-CM | POA: Diagnosis not present

## 2022-05-18 DIAGNOSIS — I10 Essential (primary) hypertension: Secondary | ICD-10-CM | POA: Diagnosis not present

## 2022-05-18 DIAGNOSIS — E1142 Type 2 diabetes mellitus with diabetic polyneuropathy: Secondary | ICD-10-CM | POA: Diagnosis not present

## 2022-05-20 ENCOUNTER — Other Ambulatory Visit: Payer: Medicaid Other | Admitting: *Deleted

## 2022-05-20 ENCOUNTER — Telehealth: Payer: Self-pay | Admitting: Orthopaedic Surgery

## 2022-05-20 DIAGNOSIS — G4733 Obstructive sleep apnea (adult) (pediatric): Secondary | ICD-10-CM | POA: Diagnosis not present

## 2022-05-20 DIAGNOSIS — J449 Chronic obstructive pulmonary disease, unspecified: Secondary | ICD-10-CM | POA: Diagnosis not present

## 2022-05-20 DIAGNOSIS — F331 Major depressive disorder, recurrent, moderate: Secondary | ICD-10-CM | POA: Diagnosis not present

## 2022-05-20 NOTE — Patient Instructions (Signed)
Visit Information  [name] was given information about Medicaid Managed Care team care coordination services as a part of their [facility] Community Plan Medicaid benefit. Stacy [name] verbally consented to engagement with the [facility] Managed Care team.   If you are experiencing a medical emergency, please call 911 or report to your local emergency department or urgent care.   If you have a non-emergency medical problem during routine business hours, please contact your provider's office and ask to speak with a nurse.   For questions related to your [facility], please call: [phone] or visit the homepage here: [email or web address]  If you would like to schedule transportation through your [facility], please call the following number at least 2 days in advance of your appointment: [phone]   Rides for urgent appointments can also be made after hours by calling Member Services.  Call the Behavioral Health Crisis Line at [phone], at any time, 24 hours a day, 7 days a week. If you are in danger or need immediate medical attention call 911.  If you would like help to quit smoking, call 1-800-QUIT-NOW (779-753-3855) OR Espaol: 1-855-Djelo-Ya (0-347-425-9563) o para ms informacin haga clic aqu or Text READY to 875-643 to register via text  Stacy [name],   Please see education materials related to fall precaution provided by MyChart link.  Patient verbalizes understanding of instructions and care plan provided today and agrees to view in MyChart. Active MyChart status and patient understanding of how to access instructions and care plan via MyChart confirmed with patient.     Telephone follow up appointment with Managed Medicaid care management team member scheduled for:06/24/22 @ 10:30am  [name] RN, BSN [place]  Managed [facility] RN Care  Coordinator [phone]   Following is a copy of your plan of care:  Care Plan : RN Care Manager Plan of Care  Updates made by [name], RN since 05/20/2022 12:00 AM     Problem: Health Management needs related to DMII      Long-Range Goal: Development of Plan of Care to address Health Management needs related to DMII   Start Date: 10/21/2021  Expected End Date: 06/02/2022  Priority: High  Note:   Current Barriers:  Chronic Disease Management support and education needs related to DMII-Patient post total knee replacement. Recovering at home with [facility] PT services ordered. PT evaluation was performed on 05/18/22, she is waiting on PT schedule. She will attend Post op follow up on 05/27/22. Stacy [name] is in need of a new mattress for a hospital bed that she has had since 2017. She is very frustrated trying to find a supplier.  RNCM Clinical Goal(s):  Patient will verbalize understanding of plan for management of DMII as evidenced by patient reports take all medications exactly as prescribed and will call provider for medication related questions as evidenced by patient reports and EMR documentation    attend all scheduled medical appointments:   TFC on 05/22/22, Post Op on 05/27/22, CT on 06/09/22  as evidenced by provider documentation        work with pharmacist to address Medication management related to DMII as evidenced by review of EMR and patient or pharmacist report    work with Child psychotherapist to address Limited access to food related to the management of DMII as evidenced by review of EMR and patient or Child psychotherapist report     through collaboration with Medical illustrator, provider, and  care team.   Interventions: Inter-disciplinary care team collaboration (see longitudinal plan of care) Evaluation of current treatment plan related to  self management and patient's adherence to plan as established by provider  Right Knee Pain  (Status:  Goal on track:  Yes.)  Long Term  Goal Evaluation of current treatment plan related to  right knee pain ,  self-management and patient's adherence to plan as established by provider. Discussed plans with patient for ongoing care management follow up and provided patient with direct contact information for care management team Reviewed medications with patient and discussed requesting refills on needed medications Assessed social determinant of health barriers Discussed the importance in BS management in the healing process Advised patient to contact [facility] regarding PT schedule Collaborated with [facility] contact regarding patient needing a new mattress-waiting on correspondence  Patient Goals/Self-Care Activities: Take medications as prescribed   Attend all scheduled provider appointments Call provider office for new concerns or questions  check blood sugar at prescribed times: twice daily take the blood sugar meter to all doctor visits keep a food diary

## 2022-05-20 NOTE — Telephone Encounter (Signed)
Order sent to [facility].  

## 2022-05-20 NOTE — Telephone Encounter (Signed)
Patient called in stating [employer] will not send just one or the other she has to get a order for the bed and the mattress please advise

## 2022-05-20 NOTE — Patient Outreach (Signed)
Medicaid Managed Care   Nurse Care Manager Note  05/20/2022 Name:  Stacy [name] MRN:  [id number] DOB:  [birth year]  Stacy [name] is an 62 y.o. year old female who is a primary patient of Marcine Matar, MD.  The [facility] Managed Care Coordination team was consulted for assistance with:    Knee pain  Ms. Deines was given information about Medicaid Managed Care Coordination team services today. Stacy [name] Patient agreed to services and verbal consent obtained.  Engaged with patient by telephone for follow up visit in response to provider referral for case management and/or care coordination services.   Assessments/Interventions:  Review of past medical history, allergies, medications, health status, including review of consultants reports, laboratory and other test data, was performed as part of comprehensive evaluation and provision of chronic care management services.  SDOH (Social Determinants of Health) assessments and interventions performed: SDOH Interventions    Flowsheet Row Patient Outreach Telephone from 05/20/2022 in [place] POPULATION HEALTH DEPARTMENT Patient Outreach Telephone from 03/09/2022 in [place] POPULATION HEALTH DEPARTMENT Patient Outreach Telephone from 01/07/2022 in [place] POPULATION HEALTH DEPARTMENT Patient Outreach Telephone from 10/21/2021 in [employer] Care Coordination  SDOH Interventions      Food Insecurity Interventions -- Intervention Not Indicated -- --  Housing Interventions -- -- -- Intervention Not Indicated  Transportation Interventions Payor Benefit Intervention Not Indicated -- Intervention Not Indicated  Alcohol Usage Interventions -- -- Intervention Not Indicated (Score <7) --       Care Plan  Allergies  Allergen Reactions   Other Shortness Of Breath    UNSPECIFIED AGENTS Allergic to perfumes and cleaning products   Shellfish Allergy Anaphylaxis    Per allergy test.   Ace  Inhibitors Cough and Other (See Comments)        Celebrex [Celecoxib]      upset stomach  Pt is taking med*   Aspirin Nausea Only    stomach upset     Medications Reviewed Today     Reviewed by Lurena Nida, RN (Registered Nurse) on 05/12/22 at 1127  Med List Status: Complete   Medication Order Taking? Sig Documenting Provider Last Dose Status Informant  albuterol (PROVENTIL) (2.5 MG/3ML) 0.083% nebulizer solution [id number] Yes USE ONE VIAL (2.5 MG TOTAL) BY NEBULIZATION EVERY 6 (SIX) HOURS AS NEEDED FOR WHEEZING. Marcine Matar, MD 05/11/2022 Active   allopurinol (ZYLOPRIM) 100 MG tablet [id number] Yes TAKE 1 TABLET (100 MG TOTAL) BY MOUTH DAILY.(AM) Naida Sleight, PA-C 05/12/2022 0700 Active Self  atorvastatin (LIPITOR) 40 MG tablet [id number] Yes TAKE 1 TABLET (40 MG TOTAL) BY MOUTH DAILY. (BEDTIME) Marcine Matar, MD 05/11/2022 Active   Blood Glucose Monitoring Suppl (ACCU-CHEK AVIVA PLUS) w/Device KIT [id number]  1 each by Does not apply route 3 (three) times daily. Anders Simmonds, [place]  Active Self  celecoxib (CELEBREX) 200 MG capsule [id number] Yes Take 200 mg by mouth 2 (two) times daily. [provider] 05/11/2022 Active Self  clopidogrel (PLAVIX) 75 MG tablet [id number] Yes TAKE 1 TABLET (75 MG TOTAL) BY MOUTH DAILY. (AM)  Patient not taking: Reported on 05/07/2022   Marcine Matar, MD  Active Self  colchicine 0.6 MG tablet [id number] Yes 2 tabs PO today then 1 tab PO daily x 4 days Marcine Matar, MD Past Week Active   dapagliflozin propanediol (FARXIGA) 5 MG TABS tablet [id number]  Take 1 tablet (5 mg total) by mouth daily before breakfast. Marcine Matar, MD  Active            Med Note Jamelle Rushing, [place]   Tue May 12, 2022 11:24 AM) Not taking  diclofenac Sodium (VOLTAREN) 1 % GEL [id number] Yes Apply 2 g topically 4 (four) times daily.  Patient taking differently: Apply 2 g topically 4 (four) times daily as needed (joint pain).   Kerrin Champagne, MD Past Month  Active Self  DULoxetine (CYMBALTA) 60 MG capsule [id number] Yes TAKE 1 CAPSULE (60 MG TOTAL) BY MOUTH DAILY (AM) [name], MD 05/12/2022 0700 Active   EPINEPHrine 0.3 mg/0.3 mL IJ SOAJ injection [id number] No 0.3 mg IM x 1 PRN for allergic reaction Marcine Matar, MD Unknown Active Self  ferrous sulfate ([name]) 325 (65 FE) MG tablet [id number] Yes TAKE ONE TABLET ( 325 MG ) BY MOUTH DAILY (AM) Marcine Matar, MD Past Month Active   fluticasone (FLONASE) 50 MCG/ACT nasal spray [id number] Yes Place 1 spray into both nostrils daily. Marcine Matar, MD 05/12/2022 0700 Active Self  Glucagon 1 MG/0.2ML SOSY [id number] No 1 mg arrange subcutaneously as needed for severe symptomatic low blood sugar Marcine Matar, MD Unknown Active Self  glucose blood (ACCU-CHEK AVIVA PLUS) test strip [id number]  Use as instructed Marcine Matar, MD  Active Self  glucose chewable tablet 12 g [id number]   Marcine Matar, MD  Active   glycopyrrolate (ROBINUL) 2 MG tablet [id number] Yes Take 2 mg by mouth 3 (three) times daily. [provider] 05/11/2022 Active Self  hydrOXYzine (ATARAX) 10 MG tablet [id number] Yes TAKE 1 TABLET IN THE MORNING AND NOON AND 2 TABLETS IN THE EVENING AS NEEDED.  Patient taking differently: Take 10 mg by mouth in the morning and at bedtime.   Marcine Matar, MD 05/11/2022 Active Self  Lancets (ACCU-CHEK MULTICLIX) lancets [id number]  Use as instructed Marcine Matar, MD  Active Self  Lancets (ACCU-CHEK SOFT TOUCH) lancets [id number]  Use as instructed Anders Simmonds, PA-C  Active Self  loratadine (CLARITIN) 10 MG tablet [id number] Yes TAKE 1 TABLET (10 MG TOTAL) BY MOUTH DAILY. (AM) Marcine Matar, MD 05/12/2022 0700 Active Self  melatonin 5 MG TABS [id number] Yes Take 5 mg by mouth at bedtime. [provider] 05/11/2022 Active Self  metFORMIN (GLUCOPHAGE-XR) 750 MG 24 hr tablet [id number] Yes Take 1 tablet (750 mg total) by mouth daily with  breakfast. Marcine Matar, MD 05/11/2022 Active   montelukast (SINGULAIR) 10 MG tablet [id number] Yes Take 10 mg by mouth daily. [provider] 05/11/2022 Active Self  Multiple Vitamins-Minerals (CENTRUM SILVER 50+WOMEN PO) [id number] Yes Take 1 tablet by mouth daily. [provider] Past Month Active Self  pantoprazole (PROTONIX) 40 MG tablet [id number] Yes Take 40 mg by mouth daily. [provider] 05/12/2022 0700 Active Self  potassium chloride SA (KLOR-CON M) 20 MEQ tablet [id number] Yes TAKE ONE TABLET BY MOUTH ONCE DAILY (NOON) Marcine Matar, MD 05/11/2022 Active   Respiratory Therapy Supplies (FLUTTER) DEVI [id number]  Use after breathing treatment 4 times daily Storm Frisk, MD  Active Self  SYMBICORT 160-4.5 MCG/ACT inhaler [id number] Yes Inhale 1 puff into the lungs 2 (two) times daily. [provider] 05/12/2022 0700 Active Self  tiotropium (SPIRIVA HANDIHALER) 18 MCG inhalation capsule [id number] Yes PLACE 1 CAPSULE (18 MCG TOTAL) INTO INHALER AND INHALE DAILY. Marcine Matar, MD 05/12/2022 0700 Active Self  triamcinolone cream (KENALOG) 0.1 % [id number] Yes Apply 1 application topically 2 (two) times daily.  Patient taking differently: Apply 1 application  topically 2 (two) times daily as needed (irritation).   Mayers, Kasandra Knudsen, PA-C Past Week Active Self  valsartan-hydrochlorothiazide (DIOVAN-HCT) 160-12.5 MG tablet [id number] Yes TAKE ONE TABLET BY MOUTH ONCE DAILY (AM) Marcine Matar, MD 05/11/2022 Active Self            Patient Active Problem List   Diagnosis Date Noted   Status post revision of total replacement of right knee 05/12/2022   Personal history of colonic polyps 03/20/2022   Failed total knee, right, subsequent encounter 03/02/2022   Loose right total knee arthroplasty 03/02/2022   Mixed stress and urge urinary incontinence 12/04/2021   Functional fecal incontinence 12/04/2021   IBS (irritable bowel syndrome) 11/19/2021    Spondylolisthesis, lumbar region    Other spondylosis with radiculopathy, lumbar region    Other secondary scoliosis, lumbar region    Fusion of spine of lumbar region 03/25/2021   Colon polyps 06/06/2020   Lupus 06/06/2020   Allergic rhinitis due to animal (cat) (dog) hair and dander 04/08/2020   Allergic rhinitis due to pollen 04/08/2020   Food allergy 04/08/2020   Acute medial meniscus tear, left, subsequent encounter 03/21/2020   Chronic pain of left knee 02/15/2020   Paresthesia of skin 11/16/2019   History of total knee replacement, right 11/16/2019   Tobacco abuse 11/16/2019   Centrilobular emphysema 08/10/2019   Incidental lung nodule, > 3mm and < 8mm 08/10/2019   OSA on CPAP 08/10/2019   Dyspnea on exertion 08/02/2019   Hyperlipidemia 08/02/2019   Lumbar radiculopathy 04/24/2019   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   Post laminectomy syndrome 02/23/2019   Abnormality of gait 02/23/2019   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   Unilateral primary osteoarthritis, left hip 12/28/2018   Lesion of skin of left ear 12/26/2018   Primary osteoarthritis of left hip 12/02/2018   Iron deficiency anemia 10/16/2018   Chronic pain syndrome 09/08/2018   Chronic pain of right knee 08/11/2018   Status post lumbar laminectomy 07/15/2018   Peripheral arterial disease 04/05/2018   Moderate persistent asthma without complication 06/29/2017   Environmental and seasonal allergies 06/29/2017   Controlled type 2 diabetes mellitus with diabetic polyneuropathy, without long-term current use of insulin 06/29/2017   Perennial allergic rhinitis 04/08/2017   Sensorineural hearing loss (SNHL), bilateral 04/08/2017   Chronic pansinusitis 03/25/2017   Eustachian tube dysfunction, bilateral 03/25/2017   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   Herniation of lumbar intervertebral disc with radiculopathy 10/02/2016    Class: Chronic   Alopecia areata 08/19/2016   Chondromalacia of both patellae  06/03/2015    Class: Chronic   Spinal stenosis, lumbar region, with neurogenic claudication 06/03/2015   Tobacco use disorder 04/25/2015   DJD (degenerative joint disease) of knee 01/04/2015   Hemorrhoid 11/14/2014   Gout of big toe 07/19/2014   Essential hypertension 08/14/2013   Gastroesophageal reflux disease without esophagitis 08/14/2013   COPD (chronic obstructive pulmonary disease) 04/17/2011    Conditions to be addressed/monitored per PCP order:   knee pain  Care Plan : RN Care Manager Plan of Care  Updates made by [name], RN since 05/20/2022 12:00 AM     Problem: Health Management needs related to DMII      Long-Range Goal: Development of Plan of Care to address Health Management needs related to DMII   Start Date: 10/21/2021  Expected End Date: 06/02/2022  Priority: High  Note:   Current Barriers:  Chronic Disease Management support  and education needs related to DMII-Patient post total knee replacement. Recovering at home with [facility] PT services ordered. PT evaluation was performed on 05/18/22, she is waiting on PT schedule. She will attend Post op follow up on 05/27/22. Ms. Goguen is in need of a new mattress for a hospital bed that she has had since 2017. She is very frustrated trying to find a supplier.  RNCM Clinical Goal(s):  Patient will verbalize understanding of plan for management of DMII as evidenced by patient reports take all medications exactly as prescribed and will call provider for medication related questions as evidenced by patient reports and EMR documentation    attend all scheduled medical appointments:   TFC on 05/22/22, Post Op on 05/27/22, CT on 06/09/22  as evidenced by provider documentation        work with pharmacist to address Medication management related to DMII as evidenced by review of EMR and patient or pharmacist report    work with Child psychotherapist to address Limited access to food related to the management of DMII as evidenced by review of  EMR and patient or Child psychotherapist report     through collaboration with Medical illustrator, provider, and care team.   Interventions: Inter-disciplinary care team collaboration (see longitudinal plan of care) Evaluation of current treatment plan related to  self management and patient's adherence to plan as established by provider  Right Knee Pain  (Status:  Goal on track:  Yes.)  Long Term Goal Evaluation of current treatment plan related to  right knee pain ,  self-management and patient's adherence to plan as established by provider. Discussed plans with patient for ongoing care management follow up and provided patient with direct contact information for care management team Reviewed medications with patient and discussed requesting refills on needed medications Assessed social determinant of health barriers Discussed the importance in BS management in the healing process Advised patient to contact [facility] regarding PT schedule Collaborated with [facility] contact regarding patient needing a new mattress-waiting on correspondence  Patient Goals/Self-Care Activities: Take medications as prescribed   Attend all scheduled provider appointments Call provider office for new concerns or questions  check blood sugar at prescribed times: twice daily take the blood sugar meter to all doctor visits keep a food diary       Follow Up:  Patient agrees to Care Plan and Follow-up.  Plan: The Managed Medicaid care management team will reach out to the patient again over the next 30 days.  Date/time of next scheduled RN care management/care coordination outreach:  06/24/22 @ 10:30am  [name] RN, BSN [place]  Managed [facility] RN Care Coordinator [phone]

## 2022-05-21 ENCOUNTER — Other Ambulatory Visit: Payer: Medicaid Other

## 2022-05-21 DIAGNOSIS — T84012D Broken internal right knee prosthesis, subsequent encounter: Secondary | ICD-10-CM | POA: Diagnosis not present

## 2022-05-21 NOTE — Patient Outreach (Signed)
Medicaid Managed Care Social Work Note  05/21/2022 Name:  Stacy [name] [facility] MRN:  [id number] DOB:  22-Apr-1960  Stacy [name] is an 62 y.o. year old female who is a primary patient of Marcine Matar, MD.  The Medicaid Managed Care Coordination team was consulted for assistance with:  Community Resources   [name] was given information about Medicaid Managed Care Coordination team services today. Stacy Pigeon Kegg Patient agreed to services and verbal consent obtained.  Engaged with patient  for by telephone forfollow up visit in response to referral for case management and/or care coordination services.   Assessments/Interventions:  Review of past medical history, allergies, medications, health status, including review of consultants reports, laboratory and other test data, was performed as part of comprehensive evaluation and provision of chronic care management services.  SDOH: (Social Determinant of Health) assessments and interventions performed: SDOH Interventions    Flowsheet Row Patient Outreach Telephone from 05/20/2022 in [place] POPULATION HEALTH DEPARTMENT Patient Outreach Telephone from 03/09/2022 in [place] POPULATION HEALTH DEPARTMENT Patient Outreach Telephone from 01/07/2022 in [place] POPULATION HEALTH DEPARTMENT Patient Outreach Telephone from 10/21/2021 in [facility] Community Care Coordination  SDOH Interventions      Food Insecurity Interventions -- Intervention Not Indicated -- --  Housing Interventions -- -- -- Intervention Not Indicated  Transportation Interventions Payor Benefit Intervention Not Indicated -- Intervention Not Indicated  Alcohol Usage Interventions -- -- Intervention Not Indicated (Score <7) --     BSW completed a telephone outreach with patient, she stated is supposed to receive her hospital bed today. Patient stated she is in need of some food but does not have transportation, BSW will submit a referral  for CSNP. No other resources are needed at this time.  Advanced Directives Status:  Not addressed in this encounter.  Care Plan                 Allergies  Allergen Reactions   Other Shortness Of Breath    UNSPECIFIED AGENTS Allergic to perfumes and cleaning products   Shellfish Allergy Anaphylaxis    Per allergy test.   Ace Inhibitors Cough and Other (See Comments)        Celebrex [Celecoxib]      upset stomach  Pt is taking med*   Aspirin Nausea Only    stomach upset     Medications Reviewed Today     Reviewed by Lurena Nida, RN (Registered Nurse) on 05/12/22 at 1127  Med List Status: Complete   Medication Order Taking? Sig Documenting Provider Last Dose Status Informant  albuterol (PROVENTIL) (2.5 MG/3ML) 0.083% nebulizer solution [id number] Yes USE ONE VIAL (2.5 MG TOTAL) BY NEBULIZATION EVERY 6 (SIX) HOURS AS NEEDED FOR WHEEZING. Marcine Matar, MD 05/11/2022 Active   allopurinol (ZYLOPRIM) 100 MG tablet [id number] Yes TAKE 1 TABLET (100 MG TOTAL) BY MOUTH DAILY.(AM) Naida Sleight, PA-C 05/12/2022 0700 Active Self  atorvastatin (LIPITOR) 40 MG tablet [id number] Yes TAKE 1 TABLET (40 MG TOTAL) BY MOUTH DAILY. (BEDTIME) Marcine Matar, MD 05/11/2022 Active   Blood Glucose Monitoring Suppl (ACCU-CHEK AVIVA PLUS) w/Device KIT [id number]  1 each by Does not apply route 3 (three) times daily. Anders Simmonds, [place]  Active Self  celecoxib (CELEBREX) 200 MG capsule [id number] Yes Take 200 mg by mouth 2 (two) times daily. [provider] 05/11/2022 Active Self  clopidogrel (PLAVIX) 75 MG tablet [id number] Yes TAKE 1 TABLET (75 MG TOTAL) BY MOUTH DAILY. (AM)  Patient not  taking: Reported on 05/07/2022   Marcine Matar, MD  Active Self  colchicine 0.6 MG tablet [id number] Yes 2 tabs PO today then 1 tab PO daily x 4 days Marcine Matar, MD Past Week Active   dapagliflozin propanediol (FARXIGA) 5 MG TABS tablet [id number]  Take 1 tablet (5 mg total) by mouth daily before  breakfast. Marcine Matar, MD  Active            Med Note [facility], [place]   Tue May 12, 2022 11:24 AM) Not taking  diclofenac Sodium (VOLTAREN) 1 % GEL [id number] Yes Apply 2 g topically 4 (four) times daily.  Patient taking differently: Apply 2 g topically 4 (four) times daily as needed (joint pain).   Kerrin Champagne, MD Past Month Active Self  DULoxetine (CYMBALTA) 60 MG capsule [id number] Yes TAKE 1 CAPSULE (60 MG TOTAL) BY MOUTH DAILY (AM) [name], MD 05/12/2022 0700 Active   EPINEPHrine 0.3 mg/0.3 mL IJ SOAJ injection [id number] No 0.3 mg IM x 1 PRN for allergic reaction Marcine Matar, MD Unknown Active Self  ferrous sulfate ([name]) 325 (65 FE) MG tablet [id number] Yes TAKE ONE TABLET ( 325 MG ) BY MOUTH DAILY (AM) Marcine Matar, MD Past Month Active   fluticasone (FLONASE) 50 MCG/ACT nasal spray [id number] Yes Place 1 spray into both nostrils daily. Marcine Matar, MD 05/12/2022 0700 Active Self  Glucagon 1 MG/0.2ML SOSY [id number] No 1 mg arrange subcutaneously as needed for severe symptomatic low blood sugar Marcine Matar, MD Unknown Active Self  glucose blood (ACCU-CHEK AVIVA PLUS) test strip [id number]  Use as instructed Marcine Matar, MD  Active Self  glucose chewable tablet 12 g [id number]   Marcine Matar, MD  Active   glycopyrrolate (ROBINUL) 2 MG tablet [id number] Yes Take 2 mg by mouth 3 (three) times daily. [provider] 05/11/2022 Active Self  hydrOXYzine (ATARAX) 10 MG tablet [id number] Yes TAKE 1 TABLET IN THE MORNING AND NOON AND 2 TABLETS IN THE EVENING AS NEEDED.  Patient taking differently: Take 10 mg by mouth in the morning and at bedtime.   Marcine Matar, MD 05/11/2022 Active Self  Lancets (ACCU-CHEK MULTICLIX) lancets [id number]  Use as instructed Marcine Matar, MD  Active Self  Lancets (ACCU-CHEK SOFT TOUCH) lancets [id number]  Use as instructed Anders Simmonds, PA-C  Active Self  loratadine (CLARITIN) 10 MG tablet  [id number] Yes TAKE 1 TABLET (10 MG TOTAL) BY MOUTH DAILY. (AM) Marcine Matar, MD 05/12/2022 0700 Active Self  melatonin 5 MG TABS [id number] Yes Take 5 mg by mouth at bedtime. [provider] 05/11/2022 Active Self  metFORMIN (GLUCOPHAGE-XR) 750 MG 24 hr tablet [id number] Yes Take 1 tablet (750 mg total) by mouth daily with breakfast. Marcine Matar, MD 05/11/2022 Active   montelukast (SINGULAIR) 10 MG tablet [id number] Yes Take 10 mg by mouth daily. [provider] 05/11/2022 Active Self  Multiple Vitamins-Minerals (CENTRUM SILVER 50+WOMEN PO) [id number] Yes Take 1 tablet by mouth daily. [provider] Past Month Active Self  pantoprazole (PROTONIX) 40 MG tablet [id number] Yes Take 40 mg by mouth daily. [provider] 05/12/2022 0700 Active Self  potassium chloride SA (KLOR-CON M) 20 MEQ tablet [id number] Yes TAKE ONE TABLET BY MOUTH ONCE DAILY (NOON) Marcine Matar, MD 05/11/2022 Active   Respiratory Therapy Supplies (FLUTTER) DEVI [id number]  Use after breathing treatment 4 times daily Storm Frisk, MD  Active Self  SYMBICORT 160-4.5 MCG/ACT  inhaler [id number] Yes Inhale 1 puff into the lungs 2 (two) times daily. [provider] 05/12/2022 0700 Active Self  tiotropium (SPIRIVA HANDIHALER) 18 MCG inhalation capsule [id number] Yes PLACE 1 CAPSULE (18 MCG TOTAL) INTO INHALER AND INHALE DAILY. Marcine Matar, MD 05/12/2022 0700 Active Self  triamcinolone cream (KENALOG) 0.1 % [id number] Yes Apply 1 application topically 2 (two) times daily.  Patient taking differently: Apply 1 application  topically 2 (two) times daily as needed (irritation).   Mayers, Kasandra Knudsen, PA-C Past Week Active Self  valsartan-hydrochlorothiazide (DIOVAN-HCT) 160-12.5 MG tablet [id number] Yes TAKE ONE TABLET BY MOUTH ONCE DAILY (AM) Marcine Matar, MD 05/11/2022 Active Self            Patient Active Problem List   Diagnosis Date Noted   Status post revision of total replacement of  right knee 05/12/2022   Personal history of colonic polyps 03/20/2022   Failed total knee, right, subsequent encounter 03/02/2022   Loose right total knee arthroplasty 03/02/2022   Mixed stress and urge urinary incontinence 12/04/2021   Functional fecal incontinence 12/04/2021   IBS (irritable bowel syndrome) 11/19/2021   Spondylolisthesis, lumbar region    Other spondylosis with radiculopathy, lumbar region    Other secondary scoliosis, lumbar region    Fusion of spine of lumbar region 03/25/2021   Colon polyps 06/06/2020   Lupus 06/06/2020   Allergic rhinitis due to animal (cat) (dog) hair and dander 04/08/2020   Allergic rhinitis due to pollen 04/08/2020   Food allergy 04/08/2020   Acute medial meniscus tear, left, subsequent encounter 03/21/2020   Chronic pain of left knee 02/15/2020   Paresthesia of skin 11/16/2019   History of total knee replacement, right 11/16/2019   Tobacco abuse 11/16/2019   Centrilobular emphysema 08/10/2019   Incidental lung nodule, > 3mm and < 8mm 08/10/2019   OSA on CPAP 08/10/2019   Dyspnea on exertion 08/02/2019   Hyperlipidemia 08/02/2019   Lumbar radiculopathy 04/24/2019   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   Post laminectomy syndrome 02/23/2019   Abnormality of gait 02/23/2019   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   Unilateral primary osteoarthritis, left hip 12/28/2018   Lesion of skin of left ear 12/26/2018   Primary osteoarthritis of left hip 12/02/2018   Iron deficiency anemia 10/16/2018   Chronic pain syndrome 09/08/2018   Chronic pain of right knee 08/11/2018   Status post lumbar laminectomy 07/15/2018   Peripheral arterial disease 04/05/2018   Moderate persistent asthma without complication 06/29/2017   Environmental and seasonal allergies 06/29/2017   Controlled type 2 diabetes mellitus with diabetic polyneuropathy, without long-term current use of insulin 06/29/2017   Perennial allergic rhinitis 04/08/2017   Sensorineural  hearing loss (SNHL), bilateral 04/08/2017   Chronic pansinusitis 03/25/2017   Eustachian tube dysfunction, bilateral 03/25/2017   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   Herniation of lumbar intervertebral disc with radiculopathy 10/02/2016    Class: Chronic   Alopecia areata 08/19/2016   Chondromalacia of both patellae 06/03/2015    Class: Chronic   Spinal stenosis, lumbar region, with neurogenic claudication 06/03/2015   Tobacco use disorder 04/25/2015   DJD (degenerative joint disease) of knee 01/04/2015   Hemorrhoid 11/14/2014   Gout of big toe 07/19/2014   Essential hypertension 08/14/2013   Gastroesophageal reflux disease without esophagitis 08/14/2013   COPD (chronic obstructive pulmonary disease) 04/17/2011    Conditions to be addressed/monitored per PCP order:   community resources  There are no care plans that you recently modified to  display for this patient.   Follow up:  Patient agrees to Care Plan and Follow-up.  Plan: The Managed Medicaid care management team will reach out to the patient again over the next 14 days.  Date/time of next scheduled Social Work care management/care coordination outreach:  06/10/22  [name], [name], [facility] Health  Managed [facility] Social Worker [phone]

## 2022-05-21 NOTE — Patient Instructions (Signed)
Visit Information  Stacy [name] was given information about Medicaid Managed Care team care coordination services as a part of their [facility] Community Plan Medicaid benefit. Stacy [name] verbally consented to engagement with the [facility] Managed Care team.   If you are experiencing a medical emergency, please call 911 or report to your local emergency department or urgent care.   If you have a non-emergency medical problem during routine business hours, please contact your provider's office and ask to speak with a nurse.   For questions related to your [facility], please call: [phone] or visit the homepage here: [email or web address]  If you would like to schedule transportation through your [facility], please call the following number at least 2 days in advance of your appointment: [phone]   Rides for urgent appointments can also be made after hours by calling Member Services.  Call the Behavioral Health Crisis Line at [phone], at any time, 24 hours a day, 7 days a week. If you are in danger or need immediate medical attention call 911.  If you would like help to quit smoking, call 1-800-QUIT-NOW (413-807-4655) OR Espaol: 1-855-Djelo-Ya (1-324-401-0272) o para ms informacin haga clic aqu or Text READY to 536-644 to register via text  Stacy [name] - following are the goals we discussed in your visit today:   Goals Addressed   None      Social Worker will follow up on 06/10/22.   [name], [name], MHA [facility] Health  Managed Medicaid Social Worker [id number]   Following is a copy of your plan of care:  There are no care plans that you recently modified to display for this patient.

## 2022-05-22 ENCOUNTER — Ambulatory Visit: Payer: Medicaid Other | Admitting: Podiatry

## 2022-05-22 ENCOUNTER — Ambulatory Visit (INDEPENDENT_AMBULATORY_CARE_PROVIDER_SITE_OTHER): Payer: Medicaid Other

## 2022-05-22 ENCOUNTER — Encounter: Payer: Self-pay | Admitting: Podiatry

## 2022-05-22 ENCOUNTER — Other Ambulatory Visit: Payer: Medicaid Other | Admitting: *Deleted

## 2022-05-22 DIAGNOSIS — K219 Gastro-esophageal reflux disease without esophagitis: Secondary | ICD-10-CM | POA: Diagnosis not present

## 2022-05-22 DIAGNOSIS — J4489 Other specified chronic obstructive pulmonary disease: Secondary | ICD-10-CM | POA: Diagnosis not present

## 2022-05-22 DIAGNOSIS — M4726 Other spondylosis with radiculopathy, lumbar region: Secondary | ICD-10-CM | POA: Diagnosis not present

## 2022-05-22 DIAGNOSIS — G8929 Other chronic pain: Secondary | ICD-10-CM | POA: Diagnosis not present

## 2022-05-22 DIAGNOSIS — F419 Anxiety disorder, unspecified: Secondary | ICD-10-CM | POA: Diagnosis not present

## 2022-05-22 DIAGNOSIS — M109 Gout, unspecified: Secondary | ICD-10-CM | POA: Diagnosis not present

## 2022-05-22 DIAGNOSIS — F32A Depression, unspecified: Secondary | ICD-10-CM | POA: Diagnosis not present

## 2022-05-22 DIAGNOSIS — G2581 Restless legs syndrome: Secondary | ICD-10-CM | POA: Diagnosis not present

## 2022-05-22 DIAGNOSIS — M722 Plantar fascial fibromatosis: Secondary | ICD-10-CM

## 2022-05-22 DIAGNOSIS — M329 Systemic lupus erythematosus, unspecified: Secondary | ICD-10-CM | POA: Diagnosis not present

## 2022-05-22 DIAGNOSIS — M5116 Intervertebral disc disorders with radiculopathy, lumbar region: Secondary | ICD-10-CM | POA: Diagnosis not present

## 2022-05-22 DIAGNOSIS — M25562 Pain in left knee: Secondary | ICD-10-CM | POA: Diagnosis not present

## 2022-05-22 DIAGNOSIS — J432 Centrilobular emphysema: Secondary | ICD-10-CM | POA: Diagnosis not present

## 2022-05-22 DIAGNOSIS — M4156 Other secondary scoliosis, lumbar region: Secondary | ICD-10-CM | POA: Diagnosis not present

## 2022-05-22 DIAGNOSIS — E1142 Type 2 diabetes mellitus with diabetic polyneuropathy: Secondary | ICD-10-CM | POA: Diagnosis not present

## 2022-05-22 DIAGNOSIS — L639 Alopecia areata, unspecified: Secondary | ICD-10-CM | POA: Diagnosis not present

## 2022-05-22 DIAGNOSIS — I10 Essential (primary) hypertension: Secondary | ICD-10-CM | POA: Diagnosis not present

## 2022-05-22 DIAGNOSIS — G4733 Obstructive sleep apnea (adult) (pediatric): Secondary | ICD-10-CM | POA: Diagnosis not present

## 2022-05-22 DIAGNOSIS — K589 Irritable bowel syndrome without diarrhea: Secondary | ICD-10-CM | POA: Diagnosis not present

## 2022-05-22 DIAGNOSIS — D509 Iron deficiency anemia, unspecified: Secondary | ICD-10-CM | POA: Diagnosis not present

## 2022-05-22 DIAGNOSIS — M79671 Pain in right foot: Secondary | ICD-10-CM

## 2022-05-22 DIAGNOSIS — M48061 Spinal stenosis, lumbar region without neurogenic claudication: Secondary | ICD-10-CM | POA: Diagnosis not present

## 2022-05-22 DIAGNOSIS — J4540 Moderate persistent asthma, uncomplicated: Secondary | ICD-10-CM | POA: Diagnosis not present

## 2022-05-22 DIAGNOSIS — M4316 Spondylolisthesis, lumbar region: Secondary | ICD-10-CM | POA: Diagnosis not present

## 2022-05-22 DIAGNOSIS — T84032D Mechanical loosening of internal right knee prosthetic joint, subsequent encounter: Secondary | ICD-10-CM | POA: Diagnosis not present

## 2022-05-22 DIAGNOSIS — M25561 Pain in right knee: Secondary | ICD-10-CM | POA: Diagnosis not present

## 2022-05-22 DIAGNOSIS — E1151 Type 2 diabetes mellitus with diabetic peripheral angiopathy without gangrene: Secondary | ICD-10-CM | POA: Diagnosis not present

## 2022-05-22 MED ORDER — TRIAMCINOLONE ACETONIDE 10 MG/ML IJ SUSP
10.0000 mg | Freq: Once | INTRAMUSCULAR | Status: AC
Start: 2022-05-22 — End: 2022-05-22
  Administered 2022-05-22: 10 mg

## 2022-05-22 NOTE — Patient Outreach (Signed)
Care Coordination  05/22/2022  Stacy [name] [facility] [birth year] [id number]  Successful outreach to [name]. RNCM reaching out to provide [name] with information on DME supplier for hospital bed. She is very happy today. She received a new hospital bed and mattress yesterday from [employer]. RNCM will follow up with patient on 06/24/22 for follow up.  [name] RN, BSN [place]  Managed [facility] RN Care Coordinator [phone]

## 2022-05-23 NOTE — Progress Notes (Signed)
Subjective:   Patient ID: Stacy [name], female   DOB: 62 y.o.   MRN: [id number]   HPI Patient presents with a lot of pain plantar aspect of her right heel and states it occurred for around the last month.  Patient has a number of other health issues   ROS      Objective:  Physical Exam  Neurovascular status intact inflammation pain of the plantar fascia right at the insertional point of the tendon calcaneus     Assessment:  Acute Planter fasciitis right with inflammation fluid of the medial band     Plan:  H&P reviewed condition sterile prep injected the plantar fascia 3 mg Kenalog 5 mg Xylocaine advised on reduced activity and reappoint as needed  X-rays indicate moderate osteoporosis inflammation with spur formation right medial heel

## 2022-05-26 ENCOUNTER — Encounter: Payer: Medicaid Other | Admitting: Orthopaedic Surgery

## 2022-05-26 DIAGNOSIS — M5116 Intervertebral disc disorders with radiculopathy, lumbar region: Secondary | ICD-10-CM | POA: Diagnosis not present

## 2022-05-26 DIAGNOSIS — G2581 Restless legs syndrome: Secondary | ICD-10-CM | POA: Diagnosis not present

## 2022-05-26 DIAGNOSIS — T84032D Mechanical loosening of internal right knee prosthetic joint, subsequent encounter: Secondary | ICD-10-CM | POA: Diagnosis not present

## 2022-05-26 DIAGNOSIS — F419 Anxiety disorder, unspecified: Secondary | ICD-10-CM | POA: Diagnosis not present

## 2022-05-26 DIAGNOSIS — M25562 Pain in left knee: Secondary | ICD-10-CM | POA: Diagnosis not present

## 2022-05-26 DIAGNOSIS — M4316 Spondylolisthesis, lumbar region: Secondary | ICD-10-CM | POA: Diagnosis not present

## 2022-05-26 DIAGNOSIS — G8929 Other chronic pain: Secondary | ICD-10-CM | POA: Diagnosis not present

## 2022-05-26 DIAGNOSIS — M25561 Pain in right knee: Secondary | ICD-10-CM | POA: Diagnosis not present

## 2022-05-26 DIAGNOSIS — M109 Gout, unspecified: Secondary | ICD-10-CM | POA: Diagnosis not present

## 2022-05-26 DIAGNOSIS — M4156 Other secondary scoliosis, lumbar region: Secondary | ICD-10-CM | POA: Diagnosis not present

## 2022-05-26 DIAGNOSIS — M4726 Other spondylosis with radiculopathy, lumbar region: Secondary | ICD-10-CM | POA: Diagnosis not present

## 2022-05-26 DIAGNOSIS — J4489 Other specified chronic obstructive pulmonary disease: Secondary | ICD-10-CM | POA: Diagnosis not present

## 2022-05-26 DIAGNOSIS — G4733 Obstructive sleep apnea (adult) (pediatric): Secondary | ICD-10-CM | POA: Diagnosis not present

## 2022-05-26 DIAGNOSIS — J4540 Moderate persistent asthma, uncomplicated: Secondary | ICD-10-CM | POA: Diagnosis not present

## 2022-05-26 DIAGNOSIS — D509 Iron deficiency anemia, unspecified: Secondary | ICD-10-CM | POA: Diagnosis not present

## 2022-05-26 DIAGNOSIS — M48061 Spinal stenosis, lumbar region without neurogenic claudication: Secondary | ICD-10-CM | POA: Diagnosis not present

## 2022-05-26 DIAGNOSIS — L639 Alopecia areata, unspecified: Secondary | ICD-10-CM | POA: Diagnosis not present

## 2022-05-26 DIAGNOSIS — M329 Systemic lupus erythematosus, unspecified: Secondary | ICD-10-CM | POA: Diagnosis not present

## 2022-05-26 DIAGNOSIS — E1151 Type 2 diabetes mellitus with diabetic peripheral angiopathy without gangrene: Secondary | ICD-10-CM | POA: Diagnosis not present

## 2022-05-26 DIAGNOSIS — K219 Gastro-esophageal reflux disease without esophagitis: Secondary | ICD-10-CM | POA: Diagnosis not present

## 2022-05-26 DIAGNOSIS — K589 Irritable bowel syndrome without diarrhea: Secondary | ICD-10-CM | POA: Diagnosis not present

## 2022-05-26 DIAGNOSIS — I10 Essential (primary) hypertension: Secondary | ICD-10-CM | POA: Diagnosis not present

## 2022-05-26 DIAGNOSIS — E1142 Type 2 diabetes mellitus with diabetic polyneuropathy: Secondary | ICD-10-CM | POA: Diagnosis not present

## 2022-05-26 DIAGNOSIS — J432 Centrilobular emphysema: Secondary | ICD-10-CM | POA: Diagnosis not present

## 2022-05-26 DIAGNOSIS — F32A Depression, unspecified: Secondary | ICD-10-CM | POA: Diagnosis not present

## 2022-05-27 ENCOUNTER — Encounter: Payer: Self-pay | Admitting: Physician Assistant

## 2022-05-27 ENCOUNTER — Ambulatory Visit (INDEPENDENT_AMBULATORY_CARE_PROVIDER_SITE_OTHER): Payer: Medicaid Other | Admitting: Physician Assistant

## 2022-05-27 ENCOUNTER — Telehealth: Payer: Self-pay

## 2022-05-27 DIAGNOSIS — Z96651 Presence of right artificial knee joint: Secondary | ICD-10-CM

## 2022-05-27 MED ORDER — OXYCODONE HCL 5 MG PO TABS: 5.0000 mg | ORAL_TABLET | ORAL | 0 refills | Status: DC | PRN

## 2022-05-27 NOTE — Progress Notes (Signed)
HPI: Stacy [name] returns today status post right total knee arthroplasty 05/12/22.  She states her pain is 9 out of 10 pain at worst.  She denies any shortness of breath, chest pain, or fevers.  She has had some chills.  She is on chronic Plavix which she started again after surgery.  She is working with home PT for range of motion and strengthening.  She does report that she had a cortisone injection done by Dr. Charlsie Merles at the foot center for plantar fasciitis this past Friday.   Physical exam: General: Well-developed well-nourished female no acute distress mood affect appropriate Right knee: Staples well-approximated the incision no signs of infection.  Calf supple nontender dorsiflexion plantarflexion ankle intact.  Full extension right knee flexion to 90 degrees.  Impression: Status post right total knee arthroplasty  Plan: She will continue to work on range of motion strengthening the knee.  Staples were harvested.  Steri-Strips applied.  She is able to get the incision wet.  Refilled her oxycodone today.  Questions were encouraged and answered at length.  Will see her back in 4 weeks sooner if there is any questions or concerns.

## 2022-05-27 NOTE — Telephone Encounter (Signed)
Called & spoke to the patient. Verified name & DOB. Informed patient to check blood sugars twice a day before meals and record the readings for the next 5 days. Informed patient to send the readings via MyChart after collecting readings. Patient expressed verbal understanding of all discussed. No further questions at this time.

## 2022-05-27 NOTE — Telephone Encounter (Signed)
Copied from CRM [id number]. Topic: General - Other >> May 26, 2022  4:47 PM Ja-Kwan M wrote: Reason for CRM: [name] a PT with [facility] reports that patient was in the hospital and had right knee revision. Per [place] visiting patient today she was experiencing elevated blood sugar fasting 313 and during the visit 152 with very little eating. Cb# [phone]

## 2022-05-29 ENCOUNTER — Ambulatory Visit: Payer: Medicaid Other | Attending: Internal Medicine | Admitting: Internal Medicine

## 2022-05-29 ENCOUNTER — Ambulatory Visit: Payer: Self-pay

## 2022-05-29 ENCOUNTER — Encounter: Payer: Self-pay | Admitting: Internal Medicine

## 2022-05-29 VITALS — BP 119/77 | HR 80 | Temp 98.4°F | Ht 66.0 in | Wt 154.0 lb

## 2022-05-29 DIAGNOSIS — G4733 Obstructive sleep apnea (adult) (pediatric): Secondary | ICD-10-CM | POA: Diagnosis not present

## 2022-05-29 DIAGNOSIS — G252 Other specified forms of tremor: Secondary | ICD-10-CM

## 2022-05-29 DIAGNOSIS — F32A Depression, unspecified: Secondary | ICD-10-CM | POA: Diagnosis not present

## 2022-05-29 DIAGNOSIS — G2581 Restless legs syndrome: Secondary | ICD-10-CM | POA: Diagnosis not present

## 2022-05-29 DIAGNOSIS — E1151 Type 2 diabetes mellitus with diabetic peripheral angiopathy without gangrene: Secondary | ICD-10-CM | POA: Diagnosis not present

## 2022-05-29 DIAGNOSIS — I10 Essential (primary) hypertension: Secondary | ICD-10-CM | POA: Diagnosis not present

## 2022-05-29 DIAGNOSIS — K219 Gastro-esophageal reflux disease without esophagitis: Secondary | ICD-10-CM | POA: Diagnosis not present

## 2022-05-29 DIAGNOSIS — M25561 Pain in right knee: Secondary | ICD-10-CM | POA: Diagnosis not present

## 2022-05-29 DIAGNOSIS — J4540 Moderate persistent asthma, uncomplicated: Secondary | ICD-10-CM | POA: Diagnosis not present

## 2022-05-29 DIAGNOSIS — M4156 Other secondary scoliosis, lumbar region: Secondary | ICD-10-CM | POA: Diagnosis not present

## 2022-05-29 DIAGNOSIS — M329 Systemic lupus erythematosus, unspecified: Secondary | ICD-10-CM | POA: Diagnosis not present

## 2022-05-29 DIAGNOSIS — M4726 Other spondylosis with radiculopathy, lumbar region: Secondary | ICD-10-CM | POA: Diagnosis not present

## 2022-05-29 DIAGNOSIS — M5116 Intervertebral disc disorders with radiculopathy, lumbar region: Secondary | ICD-10-CM | POA: Diagnosis not present

## 2022-05-29 DIAGNOSIS — J432 Centrilobular emphysema: Secondary | ICD-10-CM | POA: Diagnosis not present

## 2022-05-29 DIAGNOSIS — K589 Irritable bowel syndrome without diarrhea: Secondary | ICD-10-CM | POA: Diagnosis not present

## 2022-05-29 DIAGNOSIS — F419 Anxiety disorder, unspecified: Secondary | ICD-10-CM | POA: Diagnosis not present

## 2022-05-29 DIAGNOSIS — M25562 Pain in left knee: Secondary | ICD-10-CM | POA: Diagnosis not present

## 2022-05-29 DIAGNOSIS — J4489 Other specified chronic obstructive pulmonary disease: Secondary | ICD-10-CM | POA: Diagnosis not present

## 2022-05-29 DIAGNOSIS — M48061 Spinal stenosis, lumbar region without neurogenic claudication: Secondary | ICD-10-CM | POA: Diagnosis not present

## 2022-05-29 DIAGNOSIS — M109 Gout, unspecified: Secondary | ICD-10-CM | POA: Diagnosis not present

## 2022-05-29 DIAGNOSIS — D509 Iron deficiency anemia, unspecified: Secondary | ICD-10-CM | POA: Diagnosis not present

## 2022-05-29 DIAGNOSIS — E1142 Type 2 diabetes mellitus with diabetic polyneuropathy: Secondary | ICD-10-CM | POA: Diagnosis not present

## 2022-05-29 DIAGNOSIS — G8929 Other chronic pain: Secondary | ICD-10-CM | POA: Diagnosis not present

## 2022-05-29 DIAGNOSIS — L639 Alopecia areata, unspecified: Secondary | ICD-10-CM | POA: Diagnosis not present

## 2022-05-29 DIAGNOSIS — T84032D Mechanical loosening of internal right knee prosthetic joint, subsequent encounter: Secondary | ICD-10-CM | POA: Diagnosis not present

## 2022-05-29 DIAGNOSIS — M4316 Spondylolisthesis, lumbar region: Secondary | ICD-10-CM | POA: Diagnosis not present

## 2022-05-29 NOTE — Telephone Encounter (Addendum)
Spoke with patient . Verified name & DOB    Patient voiced that her tremors are getting worst to the point that she has been having trouble writing. Voices that she had to go to the hospital last year because of her tremors. Patient requesting to see PCP as soon as possible. PCP will be out off next week.  Appointment for today given with PCP. Patient also advised to Bring in her glucometer as well in case PCP wanted to know what her BS reading have been. Patient voiced that she did not have any transportation. Transportation for patient arranged through [employer] taxi services. Advised patient to be ready to be pick up at 1:45 today and the Taxi would only  bring her to her appointment and back home. They will not be able to make any stopped along the way. Patient voiced understanding of all discussed .

## 2022-05-29 NOTE — Patient Instructions (Signed)
Try spacing out the methocarbamol and oxycodone to make sure that they are not causing the tremors.

## 2022-05-29 NOTE — Telephone Encounter (Signed)
  Chief Complaint: tremors Symptoms: Tremors LUE, worsening and more prominent with activity, not so much at rest  Frequency: since end of March  Pertinent Negatives: NA Disposition: [] ED /[] Urgent Care (no appt availability in office) / [x] Appointment(In office/virtual)/ []  [place] [facility]/ [] Home Care/ [] Refused Recommended Disposition /[] [place] [facility]/ []  Follow-up with PCP Additional Notes: Rosanne Ashing, PT from [facility] calling to report increase in LUE tremors. Says not noticed in RUE but def in the LUE. Scheduled pt OV for first available 06/17/22 at 0950 with Marylene Land, PA and added to waitlist. Rosanne Ashing, PT also reports CBGs are much better this week, has been in mid to low 100s. No further assistance needed.   Reason for Disposition  [1] Weakness of arm / hand, or leg / foot AND [2] is a chronic symptom (recurrent or ongoing AND present > 4 weeks)  Answer Assessment - Initial Assessment Questions 1. SYMPTOM: "What is the main symptom you are concerned about?" (e.g., weakness, numbness)     Tremors in LUE  2. ONSET: "When did this start?" (minutes, hours, days; while sleeping)     Since end of March, 2 weeks prior to surgery  4. PATTERN "Does this come and go, or has it been constant since it started?"  "Is it present now?"     Constant  Protocols used: Neurologic Deficit-A-AH

## 2022-05-29 NOTE — Progress Notes (Signed)
Patient ID: Annika Selke [facility], female    DOB: [birth year]  MRN: [id number]  CC: Tremors (Tremors X6 mo/Unable to find top to flutter./Brought in BS readings )   Subjective: Telesa Jeancharles is a 62 y.o. female who presents for UC visit Her concerns today include:  hx of HTN, DM with neuropathy, aortic atherosclerosis, tob dep, HL, PAD, IDA, lupus, spinal stenosis with neurogenic claudication (s/p laminectomy 07/2018), COPD, OSA on CPAP,LS, OA knees, gout, recurrent sinusitis, receiving allergy shots from Dr. Young Berry.    C/o having tremors in LT hand x several mths.   Getting worse over past mth.  Has difficulty trying to write.  Trauma is at rest and with action.   Only new meds are Oxycodone and Robaxin post surgery.  Patient Active Problem List   Diagnosis Date Noted   Status post revision of total replacement of right knee 05/12/2022   Personal history of colonic polyps 03/20/2022   Failed total knee, right, subsequent encounter 03/02/2022   Loose right total knee arthroplasty (HCC) 03/02/2022   Mixed stress and urge urinary incontinence 12/04/2021   Functional fecal incontinence 12/04/2021   IBS (irritable bowel syndrome) 11/19/2021   Spondylolisthesis, lumbar region    Other spondylosis with radiculopathy, lumbar region    Other secondary scoliosis, lumbar region    Fusion of spine of lumbar region 03/25/2021   Colon polyps 06/06/2020   Lupus (HCC) 06/06/2020   Allergic rhinitis due to animal (cat) (dog) hair and dander 04/08/2020   Allergic rhinitis due to pollen 04/08/2020   Food allergy 04/08/2020   Acute medial meniscus tear, left, subsequent encounter 03/21/2020   Chronic pain of left knee 02/15/2020   Paresthesia of skin 11/16/2019   History of total knee replacement, right 11/16/2019   Tobacco abuse 11/16/2019   Centrilobular emphysema (HCC) 08/10/2019   Incidental lung nodule, > 3mm and < 8mm 08/10/2019   OSA on CPAP 08/10/2019   Dyspnea on exertion  08/02/2019   Hyperlipidemia 08/02/2019   Lumbar radiculopathy 04/24/2019   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   Post laminectomy syndrome 02/23/2019   Abnormality of gait 02/23/2019   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   Unilateral primary osteoarthritis, left hip 12/28/2018   Lesion of skin of left ear 12/26/2018   Primary osteoarthritis of left hip 12/02/2018   Iron deficiency anemia 10/16/2018   Chronic pain syndrome 09/08/2018   Chronic pain of right knee 08/11/2018   Status post lumbar laminectomy 07/15/2018   Peripheral arterial disease (HCC) 04/05/2018   Moderate persistent asthma without complication 06/29/2017   Environmental and seasonal allergies 06/29/2017   Controlled type 2 diabetes mellitus with diabetic polyneuropathy, without long-term current use of insulin (HCC) 06/29/2017   Perennial allergic rhinitis 04/08/2017   Sensorineural hearing loss (SNHL), bilateral 04/08/2017   Chronic pansinusitis 03/25/2017   Eustachian tube dysfunction, bilateral 03/25/2017   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   Herniation of lumbar intervertebral disc with radiculopathy 10/02/2016    Class: Chronic   Alopecia areata 08/19/2016   Chondromalacia of both patellae 06/03/2015    Class: Chronic   Spinal stenosis, lumbar region, with neurogenic claudication 06/03/2015   Tobacco use disorder 04/25/2015   DJD (degenerative joint disease) of knee 01/04/2015   Hemorrhoid 11/14/2014   Gout of big toe 07/19/2014   Essential hypertension 08/14/2013   Gastroesophageal reflux disease without esophagitis 08/14/2013   COPD (chronic obstructive pulmonary disease) (HCC) 04/17/2011     Current Outpatient Medications on File Prior to  Visit  Medication Sig Dispense Refill   albuterol (PROVENTIL) (2.5 MG/3ML) 0.083% nebulizer solution USE ONE VIAL (2.5 MG TOTAL) BY NEBULIZATION EVERY 6 (SIX) HOURS AS NEEDED FOR WHEEZING. 360 mL 0   allopurinol (ZYLOPRIM) 100 MG tablet TAKE 1 TABLET (100 MG  TOTAL) BY MOUTH DAILY.(AM) 90 tablet 3   atorvastatin (LIPITOR) 40 MG tablet TAKE 1 TABLET (40 MG TOTAL) BY MOUTH DAILY. (BEDTIME) 90 tablet 0   Blood Glucose Monitoring Suppl (ACCU-CHEK AVIVA PLUS) w/Device KIT 1 each by Does not apply route 3 (three) times daily. 1 kit 0   celecoxib (CELEBREX) 200 MG capsule Take 200 mg by mouth 2 (two) times daily.     clopidogrel (PLAVIX) 75 MG tablet TAKE 1 TABLET (75 MG TOTAL) BY MOUTH DAILY. (AM) 30 tablet 5   dapagliflozin propanediol (FARXIGA) 5 MG TABS tablet Take 1 tablet (5 mg total) by mouth daily before breakfast. 90 tablet 1   diclofenac Sodium (VOLTAREN) 1 % GEL Apply 2 g topically 4 (four) times daily. (Patient taking differently: Apply 2 g topically 4 (four) times daily as needed (joint pain).) 350 g 5   DULoxetine (CYMBALTA) 60 MG capsule TAKE 1 CAPSULE (60 MG TOTAL) BY MOUTH DAILY (AM) 30 capsule 6   EPINEPHrine 0.3 mg/0.3 mL IJ SOAJ injection 0.3 mg IM x 1 PRN for allergic reaction 1 Device 1   ferrous sulfate ([name]) 325 (65 FE) MG tablet TAKE ONE TABLET ( 325 MG ) BY MOUTH DAILY (AM) 100 tablet 0   fluticasone (FLONASE) 50 MCG/ACT nasal spray Place 1 spray into both nostrils daily. 16 g 6   Glucagon 1 MG/0.2ML SOSY 1 mg arrange subcutaneously as needed for severe symptomatic low blood sugar 0.2 mL 0   glucose blood (ACCU-CHEK AVIVA PLUS) test strip Use as instructed 100 each 12   glycopyrrolate (ROBINUL) 2 MG tablet Take 2 mg by mouth 3 (three) times daily.     hydrOXYzine (ATARAX) 10 MG tablet TAKE 1 TABLET IN THE MORNING AND NOON AND 2 TABLETS IN THE EVENING AS NEEDED. (Patient taking differently: Take 10 mg by mouth in the morning and at bedtime.) 120 tablet 0   Lancets (ACCU-CHEK MULTICLIX) lancets Use as instructed 100 each 12   Lancets (ACCU-CHEK SOFT TOUCH) lancets Use as instructed 100 each 12   loratadine (CLARITIN) 10 MG tablet TAKE 1 TABLET (10 MG TOTAL) BY MOUTH DAILY. (AM) 90 tablet 0   melatonin 5 MG TABS Take 5 mg by mouth at  bedtime.     metFORMIN (GLUCOPHAGE-XR) 750 MG 24 hr tablet Take 1 tablet (750 mg total) by mouth daily with breakfast. 90 tablet 1   methocarbamol (ROBAXIN) 500 MG tablet Take 1 tablet (500 mg total) by mouth every 6 (six) hours as needed for muscle spasms. 40 tablet 1   montelukast (SINGULAIR) 10 MG tablet Take 10 mg by mouth daily.     Multiple Vitamins-Minerals (CENTRUM SILVER 50+WOMEN PO) Take 1 tablet by mouth daily.     oxyCODONE (OXY IR/ROXICODONE) 5 MG immediate release tablet Take 1-2 tablets (5-10 mg total) by mouth every 4 (four) hours as needed for moderate pain (pain score 4-6). 30 tablet 0   pantoprazole (PROTONIX) 40 MG tablet Take 40 mg by mouth daily.     potassium chloride SA (KLOR-CON M) 20 MEQ tablet TAKE ONE TABLET BY MOUTH ONCE DAILY (NOON) 90 tablet 0   Respiratory Therapy Supplies (FLUTTER) DEVI Use after breathing treatment 4 times daily 1 each 0  SYMBICORT 160-4.5 MCG/ACT inhaler Inhale 1 puff into the lungs 2 (two) times daily.     tiotropium (SPIRIVA HANDIHALER) 18 MCG inhalation capsule PLACE 1 CAPSULE (18 MCG TOTAL) INTO INHALER AND INHALE DAILY. 30 capsule 1   triamcinolone cream (KENALOG) 0.1 % Apply 1 application topically 2 (two) times daily. (Patient taking differently: Apply 1 application  topically 2 (two) times daily as needed (irritation).) 30 g 0   valsartan-hydrochlorothiazide (DIOVAN-HCT) 160-12.5 MG tablet TAKE ONE TABLET BY MOUTH ONCE DAILY (AM) 90 tablet 0   colchicine 0.6 MG tablet 2 tabs PO today then 1 tab PO daily x 4 days (Patient not taking: Reported on 05/29/2022) 6 tablet 0   [DISCONTINUED] Fluticasone-Salmeterol (ADVAIR) 500-50 MCG/DOSE AEPB Inhale 1 puff into the lungs every 12 (twelve) hours.       [DISCONTINUED] lisinopril (PRINIVIL,ZESTRIL) 40 MG tablet Take 40 mg by mouth daily.       Current Facility-Administered Medications on File Prior to Visit  Medication Dose Route Frequency Provider Last Rate Last Admin   glucose chewable tablet 12  g  3 tablet Oral Once Marcine Matar, MD        Allergies  Allergen Reactions   Other Shortness Of Breath    UNSPECIFIED AGENTS Allergic to perfumes and cleaning products   Shellfish Allergy Anaphylaxis    Per allergy test.   Ace Inhibitors Cough and Other (See Comments)        Celebrex [Celecoxib]      upset stomach  Pt is taking med*   Aspirin Nausea Only    stomach upset     Social History   Socioeconomic History   Marital status: Married    Spouse name: Not on file   Number of children: 2   Years of education: Not on file   Highest education level: Not on file  Occupational History   Occupation: unemployed    Employer: UNEMPLOYED  Tobacco Use   Smoking status: Former   Smokeless tobacco: Never  Building services engineer Use: Never used  Substance and Sexual Activity   Alcohol use: No   Drug use: No   Sexual activity: Yes    Birth control/protection: Surgical, Post-menopausal    Comment: tubal ligation  Other Topics Concern   Not on file  Social History Narrative   Left Handed    Lives in a two story apartment. With husband   Drinks Caffeine 1 cup   retired   International aid/development worker of [employer] investment banker Strain: Not on file  Food Insecurity: No Food Insecurity (03/09/2022)   Hunger Vital Sign    Worried About Running Out of Food in the Last Year: Never true    Ran Out of Food in the Last Year: Never true  Transportation Needs: No Transportation Needs (05/20/2022)   PRAPARE - Administrator, Civil Service (Medical): No    Lack of Transportation (Non-Medical): No  Physical Activity: Not on file  Stress: Not on file  Social Connections: Not on file  Intimate Partner Violence: Not on file    Family History  Problem Relation Age of Onset   Hypertension Father    Cancer Father    Heart disease Mother    Asthma Son        had as a child   Heart disease Sister    Breast cancer Sister    Hypertension Brother     Past Surgical  History:  Procedure Laterality Date  BACK SURGERY     feb 21, 23   CHOLECYSTECTOMY     COLONOSCOPY     ENDOMETRIAL ABLATION  10/2010   HERNIA REPAIR     umbicial hernia   JOINT REPLACEMENT Left 03/14/2019   Dr. Magnus Ivan hip   KNEE ARTHROSCOPY Left    06/07/2017 Dr. August [facility]   KNEE ARTHROSCOPY Left 03/21/2020   Procedure: LEFT KNEE ARTHROSCOPY WITH PARTIAL MEDIAL MENISCECTOMY;  Surgeon: [name], MD;  Location: [facility];  Service: Orthopedics;  Laterality: Left;   KNEE CLOSED REDUCTION Right 12/06/2015   Procedure: CLOSED MANIPULATION RIGHT KNEE;  Surgeon: Kerrin Champagne, MD;  Location: [facility];  Service: Orthopedics;  Laterality: Right;   KNEE CLOSED REDUCTION Right 01/17/2016   Procedure: CLOSED MANIPULATION RIGHT KNEE;  Surgeon: Kerrin Champagne, MD;  Location: [facility];  Service: Orthopedics;  Laterality: Right;   KNEE JOINT MANIPULATION Right 12/06/2015   LACRIMAL TUBE INSERTION Bilateral 03/01/2019   Procedure: LACRIMAL TUBE INSERTION;  Surgeon: Aura Camps, MD;  Location: [facility];  Service: Ophthalmology;  Laterality: Bilateral;   LACRIMAL TUBE REMOVAL Bilateral 05/03/2019   Procedure: BILATERAL NASOLACRIMAL DUCT PROBING, IIRIGATION AND TUBE REMOVAL BOTH EYES;  Surgeon: Aura Camps, MD;  Location: [facility];  Service: Ophthalmology;  Laterality: Bilateral;   LUMBAR LAMINECTOMY/DECOMPRESSION MICRODISCECTOMY N/A 06/03/2015   Procedure: Bilateral lateral recess decompression L2-3, L3-4, L4-5;  Surgeon: Kerrin Champagne, MD;  Location: [facility];  Service: Orthopedics;  Laterality: N/A;   LUMBAR LAMINECTOMY/DECOMPRESSION MICRODISCECTOMY N/A 10/02/2016   Procedure: Right L5-S1 Lateral Recess Decompression  microdiscectomy;  Surgeon: Kerrin Champagne, MD;  Location: [facility];  Service: Orthopedics;  Laterality: N/A;   LUMBAR LAMINECTOMY/DECOMPRESSION MICRODISCECTOMY N/A 07/15/2018   Procedure: LEFT L3-4 MICRODISCECTOMY;   Surgeon: Kerrin Champagne, MD;  Location: [facility];  Service: Orthopedics;  Laterality: N/A;   svd      x 2   TEAR DUCT PROBING Bilateral 03/01/2019   Procedure: TEAR DUCT PROBING WITH IRRIGATION;  Surgeon: Aura Camps, MD;  Location: [facility];  Service: Ophthalmology;  Laterality: Bilateral;   TOTAL HIP ARTHROPLASTY Left 03/14/2019   Procedure: LEFT TOTAL HIP ARTHROPLASTY ANTERIOR APPROACH;  Surgeon: [name], MD;  Location: [facility];  Service: Orthopedics;  Laterality: Left;   TOTAL KNEE ARTHROPLASTY Right 09/06/2015   Procedure: RIGHT TOTAL KNEE ARTHROPLASTY;  Surgeon: Kerrin Champagne, MD;  Location: [facility];  Service: Orthopedics;  Laterality: Right;   TOTAL KNEE REVISION Right 05/12/2022   Procedure: RIGHT TOTAL KNEE REVISION ARTHROPLASTY;  Surgeon: [name], MD;  Location: [facility];  Service: Orthopedics;  Laterality: Right;   TUBAL LIGATION     UPPER GASTROINTESTINAL ENDOSCOPY  04/28/2011    ROS: Review of Systems Negative except as stated above  PHYSICAL EXAM: BP 119/77 (BP Location: Left Arm, Patient Position: Sitting, Cuff Size: Normal)   Pulse 80   Temp 98.4 F (36.9 C) (Oral)   Ht 5\' 6"  (1.676 m)   Wt 154 lb (69.9 kg)   LMP 09/01/2010   SpO2 100%   BMI 24.86 kg/m   Physical Exam  General appearance - alert, well appearing, and in no distress Mental status - normal mood, behavior, speech, dress, motor activity, and thought processes Neurological -patient with coarse resting tremor of the left hand.  Also with coarse tremor with action.  Noted to stop however when patient gets distracted intermittently during our conversation. Grip 4/5 bilaterally.  Power in the upper extremities 4+/5 bilaterally. She ambulates with a rollator walker.     Latest Ref Rng & Units 05/13/2022    1:53 AM 04/30/2022    9:09 AM 01/05/2022   12:15 PM  CMP  Glucose 70 - 99 mg/dL 161  096  85   BUN 8 - 23 mg/dL 15  7  10    Creatinine 0.44 - 1.00 mg/dL  0.45  4.09  8.11   Sodium 135 - 145 mmol/L 134  137  140   Potassium 3.5 - 5.1 mmol/L 3.8  3.5  4.4   Chloride 98 - 111 mmol/L 98  97  100   CO2 22 - 32 mmol/L 26  28  26    Calcium 8.9 - 10.3 mg/dL 9.0  9.8  91.4   Total Protein 6.0 - 8.5 g/dL   7.7   Total Bilirubin 0.0 - 1.2 mg/dL   0.2   Alkaline Phos 44 - 121 IU/L   114   AST 0 - 40 IU/L   17   ALT 0 - 32 IU/L   11    Lipid Panel     Component Value Date/Time   CHOL 132 01/05/2022 1215   TRIG 116 01/05/2022 1215   HDL 47 01/05/2022 1215   CHOLHDL 2.8 01/05/2022 1215   CHOLHDL 4.2 07/19/2014 1202   VLDL 24 07/19/2014 1202   LDLCALC 64 01/05/2022 1215    CBC    Component Value Date/Time   WBC 11.1 (H) 05/13/2022 0153   RBC 3.85 (L) 05/13/2022 0153   HGB 10.4 (L) 05/13/2022 0153   HGB 11.3 05/01/2021 1526   HCT 31.1 (L) 05/13/2022 0153   HCT 34.7 05/01/2021 1526   PLT 262 05/13/2022 0153   PLT 297 05/01/2021 1526   MCV 80.8 05/13/2022 0153   MCV 81 05/01/2021 1526   MCH 27.0 05/13/2022 0153   MCHC 33.4 05/13/2022 0153   RDW 14.1 05/13/2022 0153   RDW 15.7 (H) 05/01/2021 1526   LYMPHSABS 2,993 01/01/2022 1644   LYMPHSABS 4.1 (H) 10/14/2018 1704   MONOABS 0.7 03/28/2021 1116   EOSABS 74 01/01/2022 1644   EOSABS 0.1 10/14/2018 1704   BASOSABS 33 01/01/2022 1644   BASOSABS 0.0 10/14/2018 1704    ASSESSMENT AND PLAN: 1. Coarse tremors Of questionable etiology.  This was not noted on recent preoperative exam that was done on her earlier this month.  Of note the tremor also stops when patient is distracted in conversation.  I doubt Marcelline Deist is causing the tremor.  Advised that she may want to space out the dosing of methocarbamol and oxycodone to see if that makes a difference.  We will check chemistry and get her in with her neurologist Dr. Allena Katz. - Ambulatory referral to Neurology - Comprehensive metabolic panel    Patient was given the opportunity to ask questions.  Patient verbalized understanding of the plan  and was able to repeat key elements of the plan.   This documentation was completed using Paediatric nurse.  Any transcriptional errors are unintentional.  Orders Placed This Encounter  Procedures   Comprehensive metabolic panel   Ambulatory referral to Neurology     Requested Prescriptions    No prescriptions requested or ordered in this encounter    No follow-ups on file.  Jonah Blue, MD, FACP

## 2022-05-30 ENCOUNTER — Other Ambulatory Visit: Payer: Self-pay | Admitting: Internal Medicine

## 2022-05-30 LAB — COMPREHENSIVE METABOLIC PANEL
ALT: 16 IU/L (ref 0–32)
AST: 19 IU/L (ref 0–40)
Albumin/Globulin Ratio: 1.4 (ref 1.2–2.2)
Albumin: 4.5 g/dL (ref 3.9–4.9)
Alkaline Phosphatase: 159 IU/L — ABNORMAL HIGH (ref 44–121)
BUN/Creatinine Ratio: 10 — ABNORMAL LOW (ref 12–28)
BUN: 9 mg/dL (ref 8–27)
Bilirubin Total: 0.4 mg/dL (ref 0.0–1.2)
CO2: 24 mmol/L (ref 20–29)
Calcium: 10.6 mg/dL — ABNORMAL HIGH (ref 8.7–10.3)
Chloride: 94 mmol/L — ABNORMAL LOW (ref 96–106)
Creatinine, Ser: 0.94 mg/dL (ref 0.57–1.00)
Globulin, Total: 3.3 g/dL (ref 1.5–4.5)
Glucose: 97 mg/dL (ref 70–99)
Potassium: 4.8 mmol/L (ref 3.5–5.2)
Sodium: 134 mmol/L (ref 134–144)
Total Protein: 7.8 g/dL (ref 6.0–8.5)
eGFR: 69 mL/min/{1.73_m2} (ref >59)

## 2022-06-01 ENCOUNTER — Emergency Department (HOSPITAL_COMMUNITY)
Admission: EM | Admit: 2022-06-01 | Discharge: 2022-06-01 | Disposition: A | Discharge status: Home / Self Care | Payer: Medicaid Other | Attending: Emergency Medicine | Admitting: Emergency Medicine

## 2022-06-01 ENCOUNTER — Emergency Department (HOSPITAL_COMMUNITY): Payer: Medicaid Other

## 2022-06-01 ENCOUNTER — Telehealth: Payer: Self-pay | Admitting: Orthopaedic Surgery

## 2022-06-01 ENCOUNTER — Encounter (HOSPITAL_COMMUNITY): Payer: Self-pay | Admitting: Emergency Medicine

## 2022-06-01 ENCOUNTER — Telehealth: Payer: Self-pay | Admitting: *Deleted

## 2022-06-01 ENCOUNTER — Other Ambulatory Visit: Payer: Self-pay

## 2022-06-01 ENCOUNTER — Ambulatory Visit: Payer: Self-pay | Admitting: *Deleted

## 2022-06-01 DIAGNOSIS — R1084 Generalized abdominal pain: Secondary | ICD-10-CM | POA: Diagnosis not present

## 2022-06-01 DIAGNOSIS — E1151 Type 2 diabetes mellitus with diabetic peripheral angiopathy without gangrene: Secondary | ICD-10-CM | POA: Diagnosis not present

## 2022-06-01 DIAGNOSIS — T84032D Mechanical loosening of internal right knee prosthetic joint, subsequent encounter: Secondary | ICD-10-CM | POA: Diagnosis not present

## 2022-06-01 DIAGNOSIS — I10 Essential (primary) hypertension: Secondary | ICD-10-CM | POA: Diagnosis not present

## 2022-06-01 DIAGNOSIS — G2581 Restless legs syndrome: Secondary | ICD-10-CM | POA: Diagnosis not present

## 2022-06-01 DIAGNOSIS — M5116 Intervertebral disc disorders with radiculopathy, lumbar region: Secondary | ICD-10-CM | POA: Diagnosis not present

## 2022-06-01 DIAGNOSIS — M4726 Other spondylosis with radiculopathy, lumbar region: Secondary | ICD-10-CM | POA: Diagnosis not present

## 2022-06-01 DIAGNOSIS — G4733 Obstructive sleep apnea (adult) (pediatric): Secondary | ICD-10-CM | POA: Diagnosis not present

## 2022-06-01 DIAGNOSIS — R1030 Lower abdominal pain, unspecified: Secondary | ICD-10-CM | POA: Diagnosis not present

## 2022-06-01 DIAGNOSIS — J4489 Other specified chronic obstructive pulmonary disease: Secondary | ICD-10-CM | POA: Diagnosis not present

## 2022-06-01 DIAGNOSIS — J449 Chronic obstructive pulmonary disease, unspecified: Secondary | ICD-10-CM | POA: Insufficient documentation

## 2022-06-01 DIAGNOSIS — M48061 Spinal stenosis, lumbar region without neurogenic claudication: Secondary | ICD-10-CM | POA: Diagnosis not present

## 2022-06-01 DIAGNOSIS — D75839 Thrombocytosis, unspecified: Secondary | ICD-10-CM | POA: Insufficient documentation

## 2022-06-01 DIAGNOSIS — Z96651 Presence of right artificial knee joint: Secondary | ICD-10-CM | POA: Diagnosis not present

## 2022-06-01 DIAGNOSIS — J4540 Moderate persistent asthma, uncomplicated: Secondary | ICD-10-CM | POA: Diagnosis not present

## 2022-06-01 DIAGNOSIS — G8929 Other chronic pain: Secondary | ICD-10-CM | POA: Diagnosis not present

## 2022-06-01 DIAGNOSIS — M329 Systemic lupus erythematosus, unspecified: Secondary | ICD-10-CM | POA: Diagnosis not present

## 2022-06-01 DIAGNOSIS — D509 Iron deficiency anemia, unspecified: Secondary | ICD-10-CM | POA: Diagnosis not present

## 2022-06-01 DIAGNOSIS — R112 Nausea with vomiting, unspecified: Secondary | ICD-10-CM | POA: Diagnosis not present

## 2022-06-01 DIAGNOSIS — M4156 Other secondary scoliosis, lumbar region: Secondary | ICD-10-CM | POA: Diagnosis not present

## 2022-06-01 DIAGNOSIS — D649 Anemia, unspecified: Secondary | ICD-10-CM | POA: Diagnosis not present

## 2022-06-01 DIAGNOSIS — F32A Depression, unspecified: Secondary | ICD-10-CM | POA: Diagnosis not present

## 2022-06-01 DIAGNOSIS — M25561 Pain in right knee: Secondary | ICD-10-CM | POA: Diagnosis not present

## 2022-06-01 DIAGNOSIS — F419 Anxiety disorder, unspecified: Secondary | ICD-10-CM | POA: Diagnosis not present

## 2022-06-01 DIAGNOSIS — K5900 Constipation, unspecified: Secondary | ICD-10-CM | POA: Insufficient documentation

## 2022-06-01 DIAGNOSIS — R109 Unspecified abdominal pain: Secondary | ICD-10-CM | POA: Diagnosis present

## 2022-06-01 DIAGNOSIS — E119 Type 2 diabetes mellitus without complications: Secondary | ICD-10-CM | POA: Diagnosis not present

## 2022-06-01 DIAGNOSIS — L639 Alopecia areata, unspecified: Secondary | ICD-10-CM | POA: Diagnosis not present

## 2022-06-01 DIAGNOSIS — J432 Centrilobular emphysema: Secondary | ICD-10-CM | POA: Diagnosis not present

## 2022-06-01 DIAGNOSIS — E1142 Type 2 diabetes mellitus with diabetic polyneuropathy: Secondary | ICD-10-CM | POA: Diagnosis not present

## 2022-06-01 DIAGNOSIS — M25562 Pain in left knee: Secondary | ICD-10-CM | POA: Diagnosis not present

## 2022-06-01 DIAGNOSIS — K219 Gastro-esophageal reflux disease without esophagitis: Secondary | ICD-10-CM | POA: Diagnosis not present

## 2022-06-01 DIAGNOSIS — R110 Nausea: Secondary | ICD-10-CM | POA: Diagnosis not present

## 2022-06-01 DIAGNOSIS — K589 Irritable bowel syndrome without diarrhea: Secondary | ICD-10-CM | POA: Diagnosis not present

## 2022-06-01 DIAGNOSIS — M4316 Spondylolisthesis, lumbar region: Secondary | ICD-10-CM | POA: Diagnosis not present

## 2022-06-01 DIAGNOSIS — M109 Gout, unspecified: Secondary | ICD-10-CM | POA: Diagnosis not present

## 2022-06-01 LAB — URINALYSIS, ROUTINE W REFLEX MICROSCOPIC
Bilirubin Urine: NEGATIVE
Glucose, UA: 150 mg/dL — AB
Hgb urine dipstick: NEGATIVE
Ketones, ur: NEGATIVE mg/dL
Leukocytes,Ua: NEGATIVE
Nitrite: NEGATIVE
Protein, ur: NEGATIVE mg/dL
Specific Gravity, Urine: 1.02 (ref 1.005–1.030)
pH: 5 (ref 5.0–8.0)

## 2022-06-01 LAB — CBC WITH DIFFERENTIAL/PLATELET
Abs Immature Granulocytes: 0.03 10*3/uL (ref 0.00–0.07)
Basophils Absolute: 0 10*3/uL (ref 0.0–0.1)
Basophils Relative: 0 %
Eosinophils Absolute: 0.1 10*3/uL (ref 0.0–0.5)
Eosinophils Relative: 1 %
HCT: 33.5 % — ABNORMAL LOW (ref 36.0–46.0)
Hemoglobin: 11 g/dL — ABNORMAL LOW (ref 12.0–15.0)
Immature Granulocytes: 0 %
Lymphocytes Relative: 33 %
Lymphs Abs: 2.4 10*3/uL (ref 0.7–4.0)
MCH: 27 pg (ref 26.0–34.0)
MCHC: 32.8 g/dL (ref 30.0–36.0)
MCV: 82.1 fL (ref 80.0–100.0)
Monocytes Absolute: 0.5 10*3/uL (ref 0.1–1.0)
Monocytes Relative: 6 %
Neutro Abs: 4.4 10*3/uL (ref 1.7–7.7)
Neutrophils Relative %: 60 %
Platelets: 449 10*3/uL — ABNORMAL HIGH (ref 150–400)
RBC: 4.08 MIL/uL (ref 3.87–5.11)
RDW: 14.6 % (ref 11.5–15.5)
WBC: 7.4 10*3/uL (ref 4.0–10.5)
nRBC: 0 % (ref 0.0–0.2)

## 2022-06-01 LAB — COMPREHENSIVE METABOLIC PANEL
ALT: 16 U/L (ref 0–44)
AST: 24 U/L (ref 15–41)
Albumin: 4.1 g/dL (ref 3.5–5.0)
Alkaline Phosphatase: 120 U/L (ref 38–126)
Anion gap: 13 (ref 5–15)
BUN: 11 mg/dL (ref 8–23)
CO2: 24 mmol/L (ref 22–32)
Calcium: 9.7 mg/dL (ref 8.9–10.3)
Chloride: 94 mmol/L — ABNORMAL LOW (ref 98–111)
Creatinine, Ser: 0.93 mg/dL (ref 0.44–1.00)
GFR, Estimated: >60 mL/min (ref >60)
Glucose, Bld: 137 mg/dL — ABNORMAL HIGH (ref 70–99)
Potassium: 4.1 mmol/L (ref 3.5–5.1)
Sodium: 131 mmol/L — ABNORMAL LOW (ref 135–145)
Total Bilirubin: 0.6 mg/dL (ref 0.3–1.2)
Total Protein: 7.7 g/dL (ref 6.5–8.1)

## 2022-06-01 LAB — LIPASE, BLOOD: Lipase: 30 U/L (ref 11–51)

## 2022-06-01 MED ORDER — IOHEXOL 350 MG/ML SOLN
75.0000 mL | Freq: Once | INTRAVENOUS | Status: AC | PRN
  Administered 2022-06-01: 75 mL via INTRAVENOUS

## 2022-06-01 MED ORDER — METHYLNALTREXONE BROMIDE 8 MG/0.4ML ~~LOC~~ SOLN: 0.4000 mL | SUBCUTANEOUS | 0 refills | Status: AC | PRN

## 2022-06-01 MED ORDER — ONDANSETRON HCL 4 MG/2ML IJ SOLN
4.0000 mg | Freq: Once | INTRAMUSCULAR | Status: AC
  Administered 2022-06-01: 4 mg via INTRAVENOUS
  Filled 2022-06-01: qty 2

## 2022-06-01 NOTE — Telephone Encounter (Signed)
He is calling to update [place] on right arm/leg cramps-called her PCP and they have sent her to neurologist for other issues. States she is constipated; told him to give her 1/2 bottle mag citrate and the other half next day if needed

## 2022-06-01 NOTE — Telephone Encounter (Signed)
Should go away over time. Probably not related to injection

## 2022-06-01 NOTE — Telephone Encounter (Signed)
Summary: nausea and vomiting restless legs, cramping right arm and leg constipation   Fayrene Fearing, home PT , called in , states, pt has nausea and vomiting, restless legs, cramping right arm and leg and constipation  . Please call back patient.     Reason for Disposition . [1] MODERATE vomiting (e.g., 3 - 5 times/day) AND [2] age > 60 years  Answer Assessment - Initial Assessment Questions 1. VOMITING SEVERITY: "How many times have you vomited in the past 24 hours?"     - MILD:  1 - 2 times/day    - MODERATE: 3 - 5 times/day, decreased oral intake without significant weight loss or symptoms of dehydration    - SEVERE: 6 or more times/day, vomits everything or nearly everything, with significant weight loss, symptoms of dehydration      moderate 2. ONSET: "When did the vomiting begin?"      Started early this morning 3. FLUIDS: "What fluids or food have you vomited up today?" "Have you been able to keep any fluids down?"     Soup, sips only 4. ABDOMEN PAIN: "Are your having any abdomen pain?" If Yes : "How bad is it and what does it feel like?" (e.g., crampy, dull, intermittent, constant)      no 5. DIARRHEA: "Is there any diarrhea?" If Yes, ask: "How many times today?"      No- constipation-  Protocols used: Vomiting-A-AH

## 2022-06-01 NOTE — Telephone Encounter (Signed)
  Chief Complaint: vomiting, constipation Symptoms: vomiting since early am- constipation since 05/16/22 Frequency: ongoing constipation using Miralax, vomiting started today Pertinent Negatives: Patient denies abdominal pain Disposition: [x] ED /[] Urgent Care (no appt availability in office) / [] Appointment(In office/virtual)/ []  [place] [facility]/ [] Home Care/ [] Refused Recommended Disposition /[] [facility]/ []  Follow-up with PCP Additional Notes: Advised per protocol- ED for evaluation of symptoms- high risk for impaction

## 2022-06-01 NOTE — ED Provider Triage Note (Signed)
Emergency Medicine Provider Triage Evaluation Note  Stacy [name] , a 62 y.o. female  was evaluated in triage.  Pt complains of abdominal pain, constipation, nausea, vomiting.  Patient reports right total knee revision performed on the ninth of this month and has been dealing with constipation intermittent since then.  Has been taking at home MiraLAX which has not invoked bowel movement.  Describes some abdominal distention with nausea and vomiting this morning.  Denies fever, chest pain, shortness of breath, urinary symptoms, vaginal symptoms.  States that her normal bowel movements occur every other day.  Review of Systems  Positive: See above Negative:   Physical Exam  BP 119/79 (BP Location: Right Arm)   Pulse 91   Temp 98.5 F (36.9 C) (Oral)   Resp 18   Ht 5\' 6"  (1.676 m)   Wt 69.9 kg   LMP 09/01/2010   SpO2 100%   BMI 24.86 kg/m  Gen:   Awake, no distress   Resp:  Normal effort  MSK:   Moves extremities without difficulty  Other:  Diffuse abdominal tenderness  Medical Decision Making  Medically screening exam initiated at 11:45 AM.  Appropriate orders placed.  Stacy [name] was informed that the remainder of the evaluation will be completed by another provider, this initial triage assessment does not replace that evaluation, and the importance of remaining in the ED until their evaluation is complete.     Peter Garter, [place] 06/01/22 1146

## 2022-06-01 NOTE — ED Triage Notes (Signed)
Per [facility] coming from home c/o constipation x 2 weeks and N/V since 2am. C/o lower abdominal pain. Recent right knee replacement. Started taking miralax on Friday.

## 2022-06-01 NOTE — Telephone Encounter (Signed)
[facility] P/T giving update needs to speak to tech..[name]), if can't reach P/T please call patient

## 2022-06-01 NOTE — Discharge Instructions (Addendum)
You can find a medicine called MiraLAX, take 2 scoops a day until you are having regular soft bowel movements.  Drink 1 bottle of magnesium citrate today.  Thank you for allowing [place] to treat you in the emergency department today.  After reviewing your examination and potential testing that was done it appears that you are safe to go home.  I would like for you to follow-up with your doctor within the next several days, have them obtain your results and follow-up with them to review all of these tests.  If you should develop severe or worsening symptoms return to the emergency department immediately

## 2022-06-01 NOTE — ED Provider Notes (Signed)
[place] EMERGENCY DEPARTMENT AT [facility] Provider Note  Medical Decision Making   HPI: Stacy [name] is a 62 y.o. female with history perinent for COPD not on home oxygen, HTN, T2DM, lupus, IBS, recent right knee arthroplasty who presents complaining of constipation and abdominal pain. History provided by patient.  No interpreter required for this encounter.   She reports that she had a knee arthroplasty on 05/12/2022.  Reports that her most recent bowel movement was 4/12 or 05-Jun-2022.  Reports that she has had poor oral intake, she has been able to tolerate some solids, however has had intermittent nausea.  Nausea has worsened over the past 2 days, as well as vomiting.  She has been able to pass gas, but does not believe that she has passed any stool since 06/05/22.  Denies any fever, chills.  Reports that she has been taking twice daily MiraLAX since 4/26 without any change.  Denies chest pain, shortness of breath, is taking oxycodone postoperatively, most recent dose yesterday.  ROS: As per HPI. Please see MAR for complete past medical history, surgical history, and social history.   Physical exam is pertinent for mild abdominal distention and mild diffuse tenderness to palpation.   The differential includes but is not limited to constipation, IBS, drug-induced constipation, intra-abdominal infection, bowel obstruction, AKI, electrolyte derangement, pancreatitis, stercoral colitis.  Additional history obtained from: Chart review External records from outside source obtained and reviewed including: Reviewed discharge summary from orthopedic surgery from 4/11  ED provider interpretation of ECG: Not indicated  ED provider interpretation of radiology/imaging: Patient reviewed CT of the abdomen and pelvis, I do not appreciate any air-fluid levels, intra-abdominal free air, abdominal free fluid, significant stool burden, inflammation around your colon.  Labs ordered were  interpreted by myself as well as my attending and were incorporated into the medical decision making process for this patient.  ED provider interpretation of labs: CBC without leukocytosis, mild stable anemia, thrombocytosis.  CMP without AKI or emergent electrolyte derangement.  Lipase WNL.  UA without UTI.  Interventions: Zofran, enema  See the EMR for full details regarding lab and imaging results. After reviewing results of initial testing, without any evidence of stercoral colitis, bowel obstruction, fecal impaction, or significant stool burden, there is some mild dilation of the common bile duct similar to prior.  Doubt intra-abdominal infection given patient only has mild tenderness, duration of illness, lack of leukocytosis, fever.  Though patient reports poor oral intake there is no evidence of AKI or limited electrolyte derangement on her labs, doubt pancreatitis given lipase WNL.  Overall feel that patient's constipation is likely multifactorial from drug-induced constipation given her oxycodone use, IBS, and poor oral intake.  Given patient reports that she has had 2 episodes of vomiting at home will administer Zofran and trial p.o., additionally discussed manual disimpaction versus outpatient trial of MiraLAX.  Given patient without significant colonic stool burden on CT do not feel the patient would benefit from enema at this time.  Patient elects for outpatient trial of MiraLAX.  Also recommended taking 1 bottle of magnesium citrate today to facilitate initiation of bowel movements, as well as prescription for methylnaltrexone for constipation related to opioids.     Consults: Not indicated  Disposition: DISCHARGE: I believe that the patient is safe for discharge home with outpatient follow-up. Patient was informed of all pertinent physical exam, laboratory, and imaging findings. Patient's suspected etiology of their symptom presentation was discussed with the patient and all questions  were answered. We discussed following up with PCP and orthopedic surgery. I provided thorough ED return precautions. The patient feels safe and comfortable with this plan.  The plan for this patient was discussed with Dr. Hyacinth Meeker, who voiced agreement and who oversaw evaluation and treatment of this patient.  Clinical Impression:  1. Constipation, unspecified constipation type    Discharge  Therapies: These medications and interventions were provided for the patient while in the ED. Medications  iohexol (OMNIPAQUE) 350 MG/ML injection 75 mL (75 mLs Intravenous Contrast Given 06/01/22 1347)  ondansetron (ZOFRAN) injection 4 mg (4 mg Intravenous Given 06/01/22 1747)    MDM generated using voice dictation software and may contain dictation errors.  Please contact me for any clarification or with any questions.  Clinical Complexity A medically appropriate history, review of systems, and physical exam was performed.  Collateral history obtained from: Chart review I personally reviewed the labs, EKG, imaging as discussed above. Patient's presentation is most consistent with acute complicated illness / injury requiring diagnostic workup Considered and ruled out life and body threatening conditions  Treatment: Outpatient follow-up Medications: Prescription Discussed patient's care with providers from the following different specialties: Not indicated  Physical Exam   ED Triage Vitals  Enc Vitals Group     BP 06/01/22 1104 119/79     Pulse Rate 06/01/22 1104 91     Resp 06/01/22 1104 18     Temp 06/01/22 1104 98.5 F (36.9 C)     Temp Source 06/01/22 1104 Oral     SpO2 06/01/22 1102 98 %     Weight 06/01/22 1103 154 lb (69.9 kg)     Height 06/01/22 1103 5\' 6"  (1.676 m)     Head Circumference --      Peak Flow --      Pain Score 06/01/22 1103 9     Pain Loc --      Pain Edu? --      Excl. in GC? --      Physical Exam Vitals and nursing note reviewed.  Constitutional:       General: She is not in acute distress.    Appearance: She is well-developed.  HENT:     Head: Normocephalic and atraumatic.  Eyes:     Conjunctiva/sclera: Conjunctivae normal.  Cardiovascular:     Rate and Rhythm: Normal rate and regular rhythm.     Heart sounds: No murmur heard. Pulmonary:     Effort: Pulmonary effort is normal. No respiratory distress.     Breath sounds: Normal breath sounds.  Abdominal:     General: Bowel sounds are normal. There is distension (mild).     Palpations: Abdomen is soft.     Tenderness: There is no abdominal tenderness (mild, diffuse). There is no guarding or rebound.  Musculoskeletal:        General: No swelling.     Cervical back: Neck supple.  Skin:    General: Skin is warm and dry.     Capillary Refill: Capillary refill takes less than 2 seconds.  Neurological:     Mental Status: She is alert and oriented to person, place, and time.  Psychiatric:        Mood and Affect: Mood normal.       Procedure Note  Procedures  CT ABDOMEN PELVIS W CONTRAST  Final Result      Julianne Rice, MD Emergency Medicine, PGY-2   Curley Spice, MD 06/01/22 Leeroy Cha    Eber Hong, MD 06/02/22  0747  

## 2022-06-01 NOTE — Telephone Encounter (Signed)
Updated the patient that per physician's instructions, verbalized understanding, said ok.

## 2022-06-01 NOTE — Telephone Encounter (Signed)
Stacy [name] PT is calling to report that after injection on 19 th, is having intermittent tingling,pins and needles sensations  at the distal base of the foot into the toes and down to the heel,has never experienced before,wanted to make physician aware and to get his recommendations, please advise.

## 2022-06-02 ENCOUNTER — Other Ambulatory Visit: Payer: Self-pay | Admitting: Podiatry

## 2022-06-02 ENCOUNTER — Other Ambulatory Visit: Payer: Self-pay | Admitting: Internal Medicine

## 2022-06-02 DIAGNOSIS — M722 Plantar fascial fibromatosis: Secondary | ICD-10-CM

## 2022-06-02 DIAGNOSIS — I10 Essential (primary) hypertension: Secondary | ICD-10-CM

## 2022-06-02 DIAGNOSIS — M79671 Pain in right foot: Secondary | ICD-10-CM

## 2022-06-03 ENCOUNTER — Telehealth: Payer: Self-pay | Admitting: Internal Medicine

## 2022-06-03 ENCOUNTER — Ambulatory Visit: Payer: Self-pay | Admitting: *Deleted

## 2022-06-03 ENCOUNTER — Encounter: Payer: Self-pay | Admitting: Internal Medicine

## 2022-06-03 ENCOUNTER — Telehealth: Payer: Self-pay

## 2022-06-03 DIAGNOSIS — R159 Full incontinence of feces: Secondary | ICD-10-CM | POA: Diagnosis not present

## 2022-06-03 DIAGNOSIS — R32 Unspecified urinary incontinence: Secondary | ICD-10-CM | POA: Diagnosis not present

## 2022-06-03 DIAGNOSIS — I10 Essential (primary) hypertension: Secondary | ICD-10-CM | POA: Diagnosis not present

## 2022-06-03 DIAGNOSIS — F331 Major depressive disorder, recurrent, moderate: Secondary | ICD-10-CM | POA: Diagnosis not present

## 2022-06-03 NOTE — Telephone Encounter (Signed)
  Chief Complaint: No BM since 05/16/2022.   Nausea since then preventing her from keeping down any medications to help with the constipation. Symptoms: Having nausea all the time.   Not eating much.  Frequency: Last BM 05/16/2022.  Pertinent Negatives: Patient denies being able to eat much due to the nausea Disposition: [x] ED /[] Urgent Care (no appt availability in office) / [] Appointment(In office/virtual)/ []  [facility]/ [] Home Care/ [] Refused Recommended Disposition /[] [facility]/ []  Follow-up with PCP Additional Notes: I have referred her to the ED since she has not have a BM since 05/16/2022 and is not drinking or eating much at all.   She has not taken the rx prescribed for her at her ED visit on 06/01/2022 for constipation.   My son took it today to [facility] so hopefully they will deliver it to me today.   I have not drank any of the magnesium citrate because I'm too nauseated.      Pt agreeable to going to the ED.   Going back to [facility].  I sent my notes to Dr. Jonah Blue at [facility].

## 2022-06-03 NOTE — Telephone Encounter (Signed)
Message from Elon Jester sent at 06/03/2022 12:09 PM EDT  Summary: Nausea   Patient called to see if she could get something called in for nausea. She has been nauseous since she left the ED on Monday. [id number] [place], [place] - [phone] AT [facility] OF [facility] & [address] Phone: [phone] Fax: [phone]          Call History   Type Contact Phone/Fax User  06/03/2022 12:08 PM EDT Phone (Incoming) Stacy [name], Stacy Pigeon "Pam" (Self) [phone] Judie Petit) Elon Jester   Reason for Disposition  Patient sounds very sick or weak to the triager    Last [facility] 05/16/2022.     Can't keep medications down that she has taken for the constipation.  Answer Assessment - Initial Assessment Questions 1. NAUSEA SEVERITY: "How bad is the nausea?" (e.g., mild, moderate, severe; dehydration, weight loss)   - MILD: loss of appetite without change in eating habits   - MODERATE: decreased oral intake without significant weight loss, dehydration, or malnutrition   - SEVERE: inadequate caloric or fluid intake, significant weight loss, symptoms of dehydration     Seen in ED 06/01/2022 for constipation.   I'm not eating much.   Friday I was vomiting.   No vomiting today.    I ate some chicken but I am having nausea.    I have IBS.   I think it's flaring up, the IBS.    My dr. Mora Appl me on Farxiga.   I started it on the 05/14/2022.    2. ONSET: "When did the nausea begin?"     Since I was seen in ED 06/01/2022.    They did a CT scan with contrast while in the ED.    The dr. Rito Ehrlich not to give me an enema because things were looking good on the CT scan.    They gave me an rx for the constipation and a bottle citrate.   I have not drank the bottle of citrate.    Last BM was 4/13.   I have not started the medicine for the constipation.   They are supposed to deliver the medicine for the constipation today to my house.    3. VOMITING: "Any vomiting?" If Yes, ask: "How many times  today?"     No     Last time was Friday I was vomiting.    4. RECURRENT SYMPTOM: "Have you had nausea before?" If Yes, ask: "When was the last time?" "What happened that time?"     Yes since seen in ED.     I was nauseated while in the ED too.   Miralax was given and I've been trying it.    I vomited the Miralax so I can't take that.    5. CAUSE: "What do you think is causing the nausea?"     I don't know.     Maybe from the constipation 6. PREGNANCY: "Is there any chance you are pregnant?" (e.g., unprotected intercourse, missed birth control pill, broken condom)     N/A due to age  Protocols used: Nausea-A-AH

## 2022-06-03 NOTE — Telephone Encounter (Signed)
Copied from CRM [id number]. Topic: General - Other >> Jun 03, 2022  3:58 PM Clide Dales wrote: Patient states that her insurance denied her CT CHEST WO CONTRAST. Patient wants to know what to do next. Patient states that if she is not feeling better by tomorrow she is going to go back to the ED. Please advise.

## 2022-06-03 NOTE — Telephone Encounter (Signed)
Noted  

## 2022-06-04 ENCOUNTER — Telehealth: Payer: Self-pay | Admitting: Orthopaedic Surgery

## 2022-06-04 ENCOUNTER — Telehealth: Payer: Self-pay

## 2022-06-04 DIAGNOSIS — M109 Gout, unspecified: Secondary | ICD-10-CM | POA: Diagnosis not present

## 2022-06-04 DIAGNOSIS — F32A Depression, unspecified: Secondary | ICD-10-CM | POA: Diagnosis not present

## 2022-06-04 DIAGNOSIS — K219 Gastro-esophageal reflux disease without esophagitis: Secondary | ICD-10-CM | POA: Diagnosis not present

## 2022-06-04 DIAGNOSIS — M329 Systemic lupus erythematosus, unspecified: Secondary | ICD-10-CM | POA: Diagnosis not present

## 2022-06-04 DIAGNOSIS — J432 Centrilobular emphysema: Secondary | ICD-10-CM | POA: Diagnosis not present

## 2022-06-04 DIAGNOSIS — D509 Iron deficiency anemia, unspecified: Secondary | ICD-10-CM | POA: Diagnosis not present

## 2022-06-04 DIAGNOSIS — E1142 Type 2 diabetes mellitus with diabetic polyneuropathy: Secondary | ICD-10-CM | POA: Diagnosis not present

## 2022-06-04 DIAGNOSIS — J4540 Moderate persistent asthma, uncomplicated: Secondary | ICD-10-CM | POA: Diagnosis not present

## 2022-06-04 DIAGNOSIS — I10 Essential (primary) hypertension: Secondary | ICD-10-CM | POA: Diagnosis not present

## 2022-06-04 DIAGNOSIS — L639 Alopecia areata, unspecified: Secondary | ICD-10-CM | POA: Diagnosis not present

## 2022-06-04 DIAGNOSIS — K589 Irritable bowel syndrome without diarrhea: Secondary | ICD-10-CM | POA: Diagnosis not present

## 2022-06-04 DIAGNOSIS — T84032D Mechanical loosening of internal right knee prosthetic joint, subsequent encounter: Secondary | ICD-10-CM | POA: Diagnosis not present

## 2022-06-04 DIAGNOSIS — E1151 Type 2 diabetes mellitus with diabetic peripheral angiopathy without gangrene: Secondary | ICD-10-CM | POA: Diagnosis not present

## 2022-06-04 DIAGNOSIS — M4316 Spondylolisthesis, lumbar region: Secondary | ICD-10-CM | POA: Diagnosis not present

## 2022-06-04 DIAGNOSIS — M4156 Other secondary scoliosis, lumbar region: Secondary | ICD-10-CM | POA: Diagnosis not present

## 2022-06-04 DIAGNOSIS — M25561 Pain in right knee: Secondary | ICD-10-CM | POA: Diagnosis not present

## 2022-06-04 DIAGNOSIS — M48061 Spinal stenosis, lumbar region without neurogenic claudication: Secondary | ICD-10-CM | POA: Diagnosis not present

## 2022-06-04 DIAGNOSIS — J4489 Other specified chronic obstructive pulmonary disease: Secondary | ICD-10-CM | POA: Diagnosis not present

## 2022-06-04 DIAGNOSIS — F419 Anxiety disorder, unspecified: Secondary | ICD-10-CM | POA: Diagnosis not present

## 2022-06-04 DIAGNOSIS — M4726 Other spondylosis with radiculopathy, lumbar region: Secondary | ICD-10-CM | POA: Diagnosis not present

## 2022-06-04 DIAGNOSIS — G8929 Other chronic pain: Secondary | ICD-10-CM | POA: Diagnosis not present

## 2022-06-04 DIAGNOSIS — M25562 Pain in left knee: Secondary | ICD-10-CM | POA: Diagnosis not present

## 2022-06-04 DIAGNOSIS — G2581 Restless legs syndrome: Secondary | ICD-10-CM | POA: Diagnosis not present

## 2022-06-04 DIAGNOSIS — G4733 Obstructive sleep apnea (adult) (pediatric): Secondary | ICD-10-CM | POA: Diagnosis not present

## 2022-06-04 DIAGNOSIS — M5116 Intervertebral disc disorders with radiculopathy, lumbar region: Secondary | ICD-10-CM | POA: Diagnosis not present

## 2022-06-04 NOTE — Telephone Encounter (Signed)
Rosanne Ashing from [facility] called in stating pt had ER visit 2 days ago due to constipation she took magnesium citrate had a bowl movement yesterday and today. Yesterday she had 2 falls left knee buckling, strength good in both legs nothing unusual observed her BD 120/80 today standing is 100/70 no symptoms upon standing and she is pushing fluids, Kevin Fenton cb# [phone]

## 2022-06-04 NOTE — Telephone Encounter (Signed)
Jim from [facility] called to report: Pt drank 1 bottle of  Mag Citrate and has had many BM's . Yesterday pt had 2 falls pt state"left knee buckling"- no injuries reported . BP sitting 120//80  and standing 100/70 resolved 1 minute after standing. Pt denied dizziness. Pt is pushing fluids.  Pt asking about lab results from ED 06/01/22.  Call pt directly.

## 2022-06-04 NOTE — Telephone Encounter (Signed)
Called & spoke to Stacy [name] at [facility] and was informed that the CT of Chest was denied due to a previous CT scan ordered earlier this year by a different provider (possibly outside of [employer]) was approved. I was advised to have the patient call [facility] [employer] to clarify that imaging was not done.  Called & spoke to the patient. Patient stated that she did no get the scan done due to she was not aware that it was approved from 03/17/22/ - 05/01/2022. Patient expressed verbal understanding and will call back with any questions or updates.

## 2022-06-05 ENCOUNTER — Other Ambulatory Visit: Payer: Self-pay | Admitting: Internal Medicine

## 2022-06-09 ENCOUNTER — Other Ambulatory Visit: Payer: Self-pay

## 2022-06-09 ENCOUNTER — Telehealth: Payer: Self-pay | Admitting: Orthopaedic Surgery

## 2022-06-09 ENCOUNTER — Other Ambulatory Visit: Payer: Self-pay | Admitting: Physician Assistant

## 2022-06-09 ENCOUNTER — Other Ambulatory Visit: Payer: Medicaid Other

## 2022-06-09 ENCOUNTER — Telehealth: Payer: Self-pay | Admitting: Internal Medicine

## 2022-06-09 DIAGNOSIS — M25561 Pain in right knee: Secondary | ICD-10-CM | POA: Diagnosis not present

## 2022-06-09 DIAGNOSIS — M5116 Intervertebral disc disorders with radiculopathy, lumbar region: Secondary | ICD-10-CM | POA: Diagnosis not present

## 2022-06-09 DIAGNOSIS — F419 Anxiety disorder, unspecified: Secondary | ICD-10-CM | POA: Diagnosis not present

## 2022-06-09 DIAGNOSIS — E1142 Type 2 diabetes mellitus with diabetic polyneuropathy: Secondary | ICD-10-CM | POA: Diagnosis not present

## 2022-06-09 DIAGNOSIS — M48061 Spinal stenosis, lumbar region without neurogenic claudication: Secondary | ICD-10-CM | POA: Diagnosis not present

## 2022-06-09 DIAGNOSIS — M109 Gout, unspecified: Secondary | ICD-10-CM | POA: Diagnosis not present

## 2022-06-09 DIAGNOSIS — E1151 Type 2 diabetes mellitus with diabetic peripheral angiopathy without gangrene: Secondary | ICD-10-CM | POA: Diagnosis not present

## 2022-06-09 DIAGNOSIS — T84032D Mechanical loosening of internal right knee prosthetic joint, subsequent encounter: Secondary | ICD-10-CM | POA: Diagnosis not present

## 2022-06-09 DIAGNOSIS — J432 Centrilobular emphysema: Secondary | ICD-10-CM | POA: Diagnosis not present

## 2022-06-09 DIAGNOSIS — K589 Irritable bowel syndrome without diarrhea: Secondary | ICD-10-CM | POA: Diagnosis not present

## 2022-06-09 DIAGNOSIS — M4726 Other spondylosis with radiculopathy, lumbar region: Secondary | ICD-10-CM | POA: Diagnosis not present

## 2022-06-09 DIAGNOSIS — M329 Systemic lupus erythematosus, unspecified: Secondary | ICD-10-CM | POA: Diagnosis not present

## 2022-06-09 DIAGNOSIS — J4540 Moderate persistent asthma, uncomplicated: Secondary | ICD-10-CM | POA: Diagnosis not present

## 2022-06-09 DIAGNOSIS — F32A Depression, unspecified: Secondary | ICD-10-CM | POA: Diagnosis not present

## 2022-06-09 DIAGNOSIS — M4316 Spondylolisthesis, lumbar region: Secondary | ICD-10-CM | POA: Diagnosis not present

## 2022-06-09 DIAGNOSIS — G8929 Other chronic pain: Secondary | ICD-10-CM | POA: Diagnosis not present

## 2022-06-09 DIAGNOSIS — M25562 Pain in left knee: Secondary | ICD-10-CM | POA: Diagnosis not present

## 2022-06-09 DIAGNOSIS — I10 Essential (primary) hypertension: Secondary | ICD-10-CM | POA: Diagnosis not present

## 2022-06-09 DIAGNOSIS — D509 Iron deficiency anemia, unspecified: Secondary | ICD-10-CM | POA: Diagnosis not present

## 2022-06-09 DIAGNOSIS — J4489 Other specified chronic obstructive pulmonary disease: Secondary | ICD-10-CM | POA: Diagnosis not present

## 2022-06-09 DIAGNOSIS — M4156 Other secondary scoliosis, lumbar region: Secondary | ICD-10-CM | POA: Diagnosis not present

## 2022-06-09 DIAGNOSIS — Z96651 Presence of right artificial knee joint: Secondary | ICD-10-CM

## 2022-06-09 DIAGNOSIS — L639 Alopecia areata, unspecified: Secondary | ICD-10-CM | POA: Diagnosis not present

## 2022-06-09 DIAGNOSIS — K219 Gastro-esophageal reflux disease without esophagitis: Secondary | ICD-10-CM | POA: Diagnosis not present

## 2022-06-09 DIAGNOSIS — G2581 Restless legs syndrome: Secondary | ICD-10-CM | POA: Diagnosis not present

## 2022-06-09 DIAGNOSIS — G4733 Obstructive sleep apnea (adult) (pediatric): Secondary | ICD-10-CM | POA: Diagnosis not present

## 2022-06-09 NOTE — Telephone Encounter (Signed)
Rosanne Ashing (PT) called from [facility] requesting orders for outpatient physical therapy. Pt would like to go [facility] on [address] [address] starting week 5/13. Pt will discharge inpatient pt 5/10. Jim secure phone number is [phone]

## 2022-06-09 NOTE — Telephone Encounter (Signed)
[name], P with [facility],reports pt. Has been drinking more fluids, eating fruits and vegetables. Having 2-3 bowel movements a day. No symptoms of pain or abdominal distress.

## 2022-06-09 NOTE — Telephone Encounter (Signed)
Spoke with patient . Verified name & DOB    Patient advised of the following per PCP"Let patient know that I have reviewed her labs from the ER.  Everything looks okay except for slightly low sodium.  She can come back to the lab in 1 week to have this rechecked.  -Constipation is likely the results of oxycodone.  Use sparingly if she is not having as much pain anymore.  -Hold Farxiga and Metformin until diarrhea from Mag citrate resolves.  -Try to drink at least 4 to 8 glasses of water daily to stay hydrated.  -She should touch base with her orthopedics if she fell on the knee that she just had knee replacement on. "  Patient voiced understanding of all discussed .

## 2022-06-09 NOTE — Telephone Encounter (Signed)
Referral sent 

## 2022-06-10 ENCOUNTER — Telehealth: Payer: Self-pay | Admitting: Internal Medicine

## 2022-06-10 ENCOUNTER — Other Ambulatory Visit: Payer: Medicaid Other

## 2022-06-10 DIAGNOSIS — F331 Major depressive disorder, recurrent, moderate: Secondary | ICD-10-CM | POA: Diagnosis not present

## 2022-06-10 NOTE — Patient Outreach (Signed)
Medicaid Managed Care Social Work Note  06/10/2022 Name:  Stacy [name] [facility] MRN:  [id number] DOB:  1960-07-06  Stacy [name] is an 62 y.o. year old female who is a primary patient of Stacy Matar, MD.  The Medicaid Managed Care Coordination team was consulted for assistance with:  Community Resources   [name] was given information about Medicaid Managed Care Coordination team services today. Stacy [place] Patient agreed to services and verbal consent obtained.  Engaged with patient  for by telephone forfollow up visit in response to referral for case management and/or care coordination services.   Assessments/Interventions:  Review of past medical history, allergies, medications, health status, including review of consultants reports, laboratory and other test data, was performed as part of comprehensive evaluation and provision of chronic care management services.  SDOH: (Social Determinant of Health) assessments and interventions performed: SDOH Interventions    Flowsheet Row Patient Outreach Telephone from 05/20/2022 in [place] POPULATION HEALTH DEPARTMENT Patient Outreach Telephone from 03/09/2022 in [place] POPULATION HEALTH DEPARTMENT Patient Outreach Telephone from 01/07/2022 in [place] POPULATION HEALTH DEPARTMENT Patient Outreach Telephone from 10/21/2021 in [facility] [employer] Care Coordination  SDOH Interventions      Food Insecurity Interventions -- Intervention Not Indicated -- --  Housing Interventions -- -- -- Intervention Not Indicated  Transportation Interventions Payor Benefit Intervention Not Indicated -- Intervention Not Indicated  Alcohol Usage Interventions -- -- Intervention Not Indicated (Score <7) --     BSW completed a telephone outreach with patient, she states she did get her hospital bed and she is still adjusting to it. She has not heard from [employer] about the food delivery. Patient states she will  be starting Outpatient therapy and is looking forward to it. BSW sent Stacy [name] an email to check on the status of the referral. No other resources are needed at this time.   Advanced Directives Status:  Not addressed in this encounter.  Care Plan                 Allergies  Allergen Reactions   Other Shortness Of Breath    UNSPECIFIED AGENTS Allergic to perfumes and cleaning products   Shellfish Allergy Anaphylaxis    Per allergy test.   Ace Inhibitors Cough and Other (See Comments)        Celebrex [Celecoxib]      upset stomach  Pt is taking med*   Aspirin Nausea Only    stomach upset     Medications Reviewed Today     Reviewed by Stacy [name], CPhT (Pharmacy Technician) on 06/01/22 at 1727  Med List Status: Complete   Medication Order Taking? Sig Documenting Provider Last Dose Status Informant  albuterol (PROVENTIL) (2.5 MG/3ML) 0.083% nebulizer solution [id number] Yes USE ONE VIAL (2.5 MG TOTAL) BY NEBULIZATION EVERY 6 (SIX) HOURS AS NEEDED FOR WHEEZING. Stacy Matar, MD 05/31/2022 Active Self, Pharmacy Records  allopurinol (ZYLOPRIM) 100 MG tablet [id number] Yes TAKE 1 TABLET (100 MG TOTAL) BY MOUTH DAILY.(AM) Naida Sleight, PA-C 05/31/2022 Active Self, Pharmacy Records  atorvastatin (LIPITOR) 40 MG tablet [id number] Yes TAKE 1 TABLET (40 MG TOTAL) BY MOUTH DAILY. (BEDTIME) Stacy Matar, MD 05/31/2022 Active Self, Pharmacy Records  Blood Glucose Monitoring Suppl (ACCU-CHEK AVIVA PLUS) w/Device KIT [id number]  1 each by Does not apply route 3 (three) times daily. Anders Simmonds, PA-C  Active Self, Pharmacy Records  celecoxib (CELEBREX) 200 MG capsule [id number] Yes Take 200 mg by  mouth 2 (two) times daily. [provider] 05/31/2022 Active Self, Pharmacy Records  clopidogrel (PLAVIX) 75 MG tablet [id number] Yes TAKE 1 TABLET (75 MG TOTAL) BY MOUTH DAILY. (AM) Stacy Matar, MD 05/31/2022 06:00am Active Self, Pharmacy Records  dapagliflozin propanediol (FARXIGA) 5  MG TABS tablet [id number] Yes Take 1 tablet (5 mg total) by mouth daily before breakfast. Stacy Matar, MD 05/31/2022 Active Self, Pharmacy Records           Med Note [facility] [place]   Tue May 12, 2022 11:24 AM) Not taking  diclofenac Sodium (VOLTAREN) 1 % GEL [id number] Yes Apply 2 g topically 4 (four) times daily.  Patient taking differently: Apply 2 g topically 4 (four) times daily as needed (joint pain).   Kerrin Champagne, MD 05/31/2022 Active Self, Pharmacy Records  DULoxetine (CYMBALTA) 60 MG capsule [id number] Yes TAKE 1 CAPSULE (60 MG TOTAL) BY MOUTH DAILY (AM) [name], MD 05/31/2022 Active Self, Pharmacy Records  EPINEPHrine 0.3 mg/0.3 mL IJ SOAJ injection [id number] Yes 0.3 mg IM x 1 PRN for allergic reaction Stacy Matar, MD  Active Self, Pharmacy Records  ferrous sulfate ([name]) 325 (65 FE) MG tablet [id number] Yes TAKE ONE TABLET ( 325 MG ) BY MOUTH DAILY (AM) Stacy Matar, MD 05/31/2022 Active Self, Pharmacy Records  fluticasone [facility]) 50 MCG/ACT nasal spray [id number] Yes Place 1 spray into both nostrils daily. Stacy Matar, MD 05/31/2022 Active Self, Pharmacy Records  Glucagon 1 MG/0.2ML SOSY [id number] Yes 1 mg arrange subcutaneously as needed for severe symptomatic low blood sugar Stacy Matar, MD  Active Self, Pharmacy Records  glucose blood (ACCU-CHEK AVIVA PLUS) test strip [id number]  Use as instructed Stacy Matar, MD  Active Self, Pharmacy Records  glucose chewable tablet 12 g [id number]   Stacy Matar, MD  Active   glycopyrrolate (ROBINUL) 2 MG tablet [id number] Yes Take 2 mg by mouth 3 (three) times daily. [provider] 05/31/2022 Active Self, Pharmacy Records  hydrOXYzine (ATARAX) 10 MG tablet [id number] Yes TAKE 1 TABLET IN THE MORNING AND NOON AND 2 TABLETS IN THE EVENING AS NEEDED.  Patient taking differently: Take 10 mg by mouth in the morning and at bedtime.   Stacy Matar, MD 05/31/2022 Active Self, Pharmacy  Records  Lancets [facility] MULTICLIX) lancets [id number]  Use as instructed Stacy Matar, MD  Active Self, Pharmacy Records  Lancets [facility] SOFT [place]) lancets [id number]  Use as instructed Anders Simmonds, PA-C  Active Self, Pharmacy Records  loratadine (CLARITIN) 10 MG tablet [id number] Yes TAKE 1 TABLET (10 MG TOTAL) BY MOUTH DAILY. (AM) Stacy Matar, MD 05/31/2022 Active Self, Pharmacy Records  melatonin 5 MG TABS [id number] Yes Take 5 mg by mouth at bedtime. [provider] 05/31/2022 Active Self, Pharmacy Records  metFORMIN (GLUCOPHAGE-XR) 750 MG 24 hr tablet [id number] Yes Take 1 tablet (750 mg total) by mouth daily with breakfast. Stacy Matar, MD 05/31/2022 Active Self, Pharmacy Records  methocarbamol (ROBAXIN) 500 MG tablet [id number] Yes Take 1 tablet (500 mg total) by mouth every 6 (six) hours as needed for muscle spasms. Kirtland Bouchard, PA-C 05/31/2022 Active Self, Pharmacy Records  montelukast (SINGULAIR) 10 MG tablet [id number] Yes Take 10 mg by mouth daily. [provider] 05/31/2022 Active Self, Pharmacy Records  Multiple Vitamins-Minerals (CENTRUM SILVER 50+WOMEN PO) [id number] Yes Take 1 tablet by mouth daily. [provider] 05/31/2022 Active Self, Pharmacy Records  oxyCODONE (OXY IR/ROXICODONE) 5 MG immediate release tablet [id number]  Yes Take 1-2 tablets (5-10 mg total) by mouth every 4 (four) hours as needed for moderate pain (pain score 4-6). Kirtland Bouchard, PA-C Past Week Active Self, Pharmacy Records  pantoprazole (PROTONIX) 40 MG tablet [id number] Yes Take 40 mg by mouth daily. [provider] 05/31/2022 Active Self, Pharmacy Records  potassium chloride SA (KLOR-CON M) 20 MEQ tablet [id number] Yes TAKE ONE TABLET BY MOUTH ONCE DAILY (NOON)  Patient taking differently: Take 20 mEq by mouth daily.   Stacy Matar, MD 05/31/2022 Active Self, Pharmacy Records  Respiratory Therapy Supplies (FLUTTER) DEVI [id number] Yes Use after  breathing treatment 4 times daily Storm Frisk, MD  Active Self, Pharmacy Records  [facility] 160-4.5 MCG/ACT inhaler [id number] Yes Inhale 1 puff into the lungs 2 (two) times daily. [provider] 05/31/2022 Active Self, Pharmacy Records  tiotropium (SPIRIVA HANDIHALER) 18 MCG inhalation capsule [id number] Yes PLACE 1 CAPSULE (18 MCG TOTAL) INTO INHALER AND INHALE DAILY. Stacy Matar, MD 05/31/2022 Active Self, Pharmacy Records  triamcinolone cream (KENALOG) 0.1 % [id number] Yes Apply 1 application topically 2 (two) times daily.  Patient taking differently: Apply 1 application  topically 2 (two) times daily as needed (irritation).   Mayers, Nickola Major 05/31/2022 Active Self, Pharmacy Records  valsartan-hydrochlorothiazide (DIOVAN-HCT) 160-12.5 MG tablet [id number] Yes TAKE ONE TABLET BY MOUTH ONCE DAILY (AM) Stacy Matar, MD 05/31/2022 Active Self, Pharmacy Records            Patient Active Problem List   Diagnosis Date Noted   Status post revision of total replacement of right knee 05/12/2022   Personal history of colonic polyps 03/20/2022   Failed total knee, right, subsequent encounter 03/02/2022   Loose right total knee arthroplasty (HCC) 03/02/2022   Mixed stress and urge urinary incontinence 12/04/2021   Functional fecal incontinence 12/04/2021   IBS (irritable bowel syndrome) 11/19/2021   Spondylolisthesis, lumbar region    Other spondylosis with radiculopathy, lumbar region    Other secondary scoliosis, lumbar region    Fusion of spine of lumbar region 03/25/2021   Colon polyps 06/06/2020   Lupus (HCC) 06/06/2020   Allergic rhinitis due to animal (cat) (dog) hair and dander 04/08/2020   Allergic rhinitis due to pollen 04/08/2020   Food allergy 04/08/2020   Acute medial meniscus tear, left, subsequent encounter 03/21/2020   Chronic pain of left knee 02/15/2020   Paresthesia of skin 11/16/2019   History of total knee replacement, right 11/16/2019    Tobacco abuse 11/16/2019   Centrilobular emphysema (HCC) 08/10/2019   Incidental lung nodule, > 3mm and < 8mm 08/10/2019   OSA on CPAP 08/10/2019   Dyspnea on exertion 08/02/2019   Hyperlipidemia 08/02/2019   Lumbar radiculopathy 04/24/2019   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   Post laminectomy syndrome 02/23/2019   Abnormality of gait 02/23/2019   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   Unilateral primary osteoarthritis, left hip 12/28/2018   Lesion of skin of left ear 12/26/2018   Primary osteoarthritis of left hip 12/02/2018   Iron deficiency anemia 10/16/2018   Chronic pain syndrome 09/08/2018   Chronic pain of right knee 08/11/2018   Status post lumbar laminectomy 07/15/2018   Peripheral arterial disease (HCC) 04/05/2018   Moderate persistent asthma without complication 06/29/2017   Environmental and seasonal allergies 06/29/2017   Controlled type 2 diabetes mellitus with diabetic polyneuropathy, without long-term current use of insulin (HCC) 06/29/2017   Perennial allergic rhinitis 04/08/2017   Sensorineural hearing loss (SNHL), bilateral 04/08/2017  Chronic pansinusitis 03/25/2017   Eustachian tube dysfunction, bilateral 03/25/2017   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   Herniation of lumbar intervertebral disc with radiculopathy 10/02/2016    Class: Chronic   Alopecia areata 08/19/2016   Chondromalacia of both patellae 06/03/2015    Class: Chronic   Spinal stenosis, lumbar region, with neurogenic claudication 06/03/2015   Tobacco use disorder 04/25/2015   DJD (degenerative joint disease) of knee 01/04/2015   Hemorrhoid 11/14/2014   Gout of big toe 07/19/2014   Essential hypertension 08/14/2013   Gastroesophageal reflux disease without esophagitis 08/14/2013   COPD (chronic obstructive pulmonary disease) (HCC) 04/17/2011    Conditions to be addressed/monitored per PCP order:   community resources  There are no care plans that you recently modified to display for  this patient.   Follow up:  Patient agrees to Care Plan and Follow-up.  Plan: The Managed Medicaid care management team will reach out to the patient again over the next 30 days.  Date/time of next scheduled Social Work care management/care coordination outreach:  07/13/22  [name], [name], [facility]  Managed [facility] Social Worker [phone]

## 2022-06-10 NOTE — Patient Instructions (Signed)
Visit Information  [name] was given information about Medicaid Managed Care team care coordination services as a part of their [facility] Community Plan Medicaid benefit. Stacy [name] verbally consented to engagement with the [facility] Managed Care team.   If you are experiencing a medical emergency, please call 911 or report to your local emergency department or urgent care.   If you have a non-emergency medical problem during routine business hours, please contact your provider's office and ask to speak with a nurse.   For questions related to your [facility], please call: [phone] or visit the homepage here: [email or web address]  If you would like to schedule transportation through your [facility], please call the following number at least 2 days in advance of your appointment: [phone]   Rides for urgent appointments can also be made after hours by calling Member Services.  Call the Behavioral Health Crisis Line at [phone], at any time, 24 hours a day, 7 days a week. If you are in danger or need immediate medical attention call 911.  If you would like help to quit smoking, call 1-800-QUIT-NOW ((782) 552-9884) OR Espaol: 1-855-Djelo-Ya (7-253-664-4034) o para ms informacin haga clic aqu or Text READY to 742-595 to register via text  [name] - following are the goals we discussed in your visit today:   Goals Addressed   None      Social Worker will follow up on 07/13/22.   Stacy [name], Stacy [name], MHA [facility]  Managed Medicaid Social Worker [phone]   Following is a copy of your plan of care:  There are no care plans that you recently modified to display for this patient.

## 2022-06-12 DIAGNOSIS — M25561 Pain in right knee: Secondary | ICD-10-CM | POA: Diagnosis not present

## 2022-06-12 DIAGNOSIS — T84032D Mechanical loosening of internal right knee prosthetic joint, subsequent encounter: Secondary | ICD-10-CM | POA: Diagnosis not present

## 2022-06-12 DIAGNOSIS — M4316 Spondylolisthesis, lumbar region: Secondary | ICD-10-CM | POA: Diagnosis not present

## 2022-06-12 DIAGNOSIS — G4733 Obstructive sleep apnea (adult) (pediatric): Secondary | ICD-10-CM | POA: Diagnosis not present

## 2022-06-12 DIAGNOSIS — M109 Gout, unspecified: Secondary | ICD-10-CM | POA: Diagnosis not present

## 2022-06-12 DIAGNOSIS — L639 Alopecia areata, unspecified: Secondary | ICD-10-CM | POA: Diagnosis not present

## 2022-06-12 DIAGNOSIS — M4156 Other secondary scoliosis, lumbar region: Secondary | ICD-10-CM | POA: Diagnosis not present

## 2022-06-12 DIAGNOSIS — E1142 Type 2 diabetes mellitus with diabetic polyneuropathy: Secondary | ICD-10-CM | POA: Diagnosis not present

## 2022-06-12 DIAGNOSIS — J4489 Other specified chronic obstructive pulmonary disease: Secondary | ICD-10-CM | POA: Diagnosis not present

## 2022-06-12 DIAGNOSIS — J432 Centrilobular emphysema: Secondary | ICD-10-CM | POA: Diagnosis not present

## 2022-06-12 DIAGNOSIS — F419 Anxiety disorder, unspecified: Secondary | ICD-10-CM | POA: Diagnosis not present

## 2022-06-12 DIAGNOSIS — D509 Iron deficiency anemia, unspecified: Secondary | ICD-10-CM | POA: Diagnosis not present

## 2022-06-12 DIAGNOSIS — I10 Essential (primary) hypertension: Secondary | ICD-10-CM | POA: Diagnosis not present

## 2022-06-12 DIAGNOSIS — K589 Irritable bowel syndrome without diarrhea: Secondary | ICD-10-CM | POA: Diagnosis not present

## 2022-06-12 DIAGNOSIS — E1151 Type 2 diabetes mellitus with diabetic peripheral angiopathy without gangrene: Secondary | ICD-10-CM | POA: Diagnosis not present

## 2022-06-12 DIAGNOSIS — M329 Systemic lupus erythematosus, unspecified: Secondary | ICD-10-CM | POA: Diagnosis not present

## 2022-06-12 DIAGNOSIS — M4726 Other spondylosis with radiculopathy, lumbar region: Secondary | ICD-10-CM | POA: Diagnosis not present

## 2022-06-12 DIAGNOSIS — M25562 Pain in left knee: Secondary | ICD-10-CM | POA: Diagnosis not present

## 2022-06-12 DIAGNOSIS — G8929 Other chronic pain: Secondary | ICD-10-CM | POA: Diagnosis not present

## 2022-06-12 DIAGNOSIS — J4540 Moderate persistent asthma, uncomplicated: Secondary | ICD-10-CM | POA: Diagnosis not present

## 2022-06-12 DIAGNOSIS — G2581 Restless legs syndrome: Secondary | ICD-10-CM | POA: Diagnosis not present

## 2022-06-12 DIAGNOSIS — K219 Gastro-esophageal reflux disease without esophagitis: Secondary | ICD-10-CM | POA: Diagnosis not present

## 2022-06-12 DIAGNOSIS — F32A Depression, unspecified: Secondary | ICD-10-CM | POA: Diagnosis not present

## 2022-06-12 DIAGNOSIS — M5116 Intervertebral disc disorders with radiculopathy, lumbar region: Secondary | ICD-10-CM | POA: Diagnosis not present

## 2022-06-12 DIAGNOSIS — M48061 Spinal stenosis, lumbar region without neurogenic claudication: Secondary | ICD-10-CM | POA: Diagnosis not present

## 2022-06-12 NOTE — Telephone Encounter (Signed)
On phone call of 06/04/2022 I informed patient to contact insurance to clarify that a CT scan of chest ordered in February was not performed due to provider and patient not aware of approval. Patient expressed verbal understanding at that time.  Since 06/06/2022 a fax was received from insurance requesting additional information in regard to CT scan ordered in April for review. All forms, notes and copy of CT of Chest done in 2023 was successfully faxed. No further action needed at this time. Ok to disregard calling [facility] at this time per Dr.Johnson.

## 2022-06-12 NOTE — Telephone Encounter (Signed)
Letter and copy of the report successfully faxed to [facility] appeals on 06/12/2022.

## 2022-06-16 ENCOUNTER — Ambulatory Visit: Payer: Medicaid Other | Attending: Orthopaedic Surgery

## 2022-06-16 DIAGNOSIS — R296 Repeated falls: Secondary | ICD-10-CM | POA: Insufficient documentation

## 2022-06-16 DIAGNOSIS — M25561 Pain in right knee: Secondary | ICD-10-CM | POA: Diagnosis present

## 2022-06-16 DIAGNOSIS — M25661 Stiffness of right knee, not elsewhere classified: Secondary | ICD-10-CM | POA: Diagnosis present

## 2022-06-16 DIAGNOSIS — M6281 Muscle weakness (generalized): Secondary | ICD-10-CM | POA: Insufficient documentation

## 2022-06-16 DIAGNOSIS — Z96651 Presence of right artificial knee joint: Secondary | ICD-10-CM | POA: Diagnosis not present

## 2022-06-16 DIAGNOSIS — R262 Difficulty in walking, not elsewhere classified: Secondary | ICD-10-CM | POA: Insufficient documentation

## 2022-06-16 NOTE — Therapy (Signed)
OUTPATIENT PHYSICAL THERAPY LOWER EXTREMITY EVALUATION   Patient Name: Stacy [name] [facility] MRN: [id number] DOB:[birth year], 62 y.o., female Today's Date: 06/16/2022  END OF SESSION:  PT End of Session - 06/16/22 0920     Visit Number 1    Date for PT Re-Evaluation 09/01/22    Authorization Type [place] [employer]    PT Start Time 0925    PT Stop Time 1010    PT Time Calculation (min) 45 min    Activity Tolerance Patient tolerated treatment well    Behavior During Therapy WFL for tasks assessed/performed             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   Asthma    COPD (chronic obstructive pulmonary disease) (HCC)    Depression    Diabetes mellitus    Type II   GERD (gastroesophageal reflux disease)    Pt on Protonix daily   Glaucoma    Gout    Headache(784.0)    otc meds prn   Hyperlipidemia    Hypertension    Irritable bowel syndrome 11/19/2010   Neuropathy    Pneumonia YRS AGO   Restless legs    Shortness of breath    07/14/2018- uses  4 times a day   Sleep apnea    Past Surgical History:  Procedure Laterality Date   BACK SURGERY     feb 21, 23   CHOLECYSTECTOMY     COLONOSCOPY     ENDOMETRIAL ABLATION  10/2010   HERNIA REPAIR     umbicial hernia   JOINT REPLACEMENT Left 03/14/2019   Dr. Magnus Ivan hip   KNEE ARTHROSCOPY Left    06/07/2017 Dr. August [facility]   KNEE ARTHROSCOPY Left 03/21/2020   Procedure: LEFT KNEE ARTHROSCOPY WITH PARTIAL MEDIAL MENISCECTOMY;  Surgeon: [name], MD;  Location: [facility];  Service: Orthopedics;  Laterality: Left;   KNEE CLOSED REDUCTION Right 12/06/2015   Procedure: CLOSED MANIPULATION RIGHT KNEE;  Surgeon: Kerrin Champagne, MD;  Location: [facility];  Service: Orthopedics;  Laterality: Right;   KNEE CLOSED REDUCTION Right 01/17/2016   Procedure: CLOSED MANIPULATION  RIGHT KNEE;  Surgeon: Kerrin Champagne, MD;  Location: [facility];  Service: Orthopedics;  Laterality: Right;   KNEE JOINT MANIPULATION Right 12/06/2015   LACRIMAL TUBE INSERTION Bilateral 03/01/2019   Procedure: LACRIMAL TUBE INSERTION;  Surgeon: Aura Camps, MD;  Location: [facility];  Service: Ophthalmology;  Laterality: Bilateral;   LACRIMAL TUBE REMOVAL Bilateral 05/03/2019   Procedure: BILATERAL NASOLACRIMAL DUCT PROBING, IIRIGATION AND TUBE REMOVAL BOTH EYES;  Surgeon: Aura Camps, MD;  Location:  SUR[facility]ERY C[facility];  Service: Ophthalmology;  Laterality: Bilateral;   LUMBAR LAMINECTOMY/DECOMPRESSION MICRODISCECTOMY N/A 06/03/2015   Procedure: Bilateral lateral recess decompression L2-3, L3-4, L4-5;  Surgeon: Kerrin Champagne, MD;  Location: [facility];  Service: Orthopedics;  Laterality: N/A;   LUMBAR LAMINECTOMY/DECOMPRESSION MICRODISCECTOMY N/A 10/02/2016   Procedure: Right L5-S1 Lateral Recess Decompression  microdiscectomy;  Surgeon: Kerrin Champagne, MD;  Location: [facility];  Service: Orthopedics;  Laterality: N/A;   LUMBAR LAMINECTOMY/DECOMPRESSION MICRODISCECTOMY N/A 07/15/2018   Procedure: LEFT L3-4 MICRODISCECTOMY;  Surgeon: Kerrin Champagne, MD;  Location: [facility] OR;  Service: Orthopedics;  Laterality: N/A;   svd      x 2   TEAR DUCT PROBING Bilateral  03/01/2019   Procedure: TEAR DUCT PROBING WITH IRRIGATION;  Surgeon: Aura Camps, MD;  Location: [facility];  Service: Ophthalmology;  Laterality: Bilateral;   TOTAL HIP ARTHROPLASTY Left 03/14/2019   Procedure: LEFT TOTAL HIP ARTHROPLASTY ANTERIOR APPROACH;  Surgeon: [name], MD;  Location: [facility];  Service: Orthopedics;  Laterality: Left;   TOTAL KNEE ARTHROPLASTY Right 09/06/2015   Procedure: RIGHT TOTAL KNEE ARTHROPLASTY;  Surgeon: Kerrin Champagne, MD;  Location: [facility];  Service: Orthopedics;  Laterality: Right;   TOTAL KNEE REVISION Right 05/12/2022   Procedure: RIGHT TOTAL KNEE REVISION  ARTHROPLASTY;  Surgeon: [name], MD;  Location: [facility];  Service: Orthopedics;  Laterality: Right;   TUBAL LIGATION     UPPER GASTROINTESTINAL ENDOSCOPY  04/28/2011   Patient Active Problem List   Diagnosis Date Noted   Status post revision of total replacement of right knee 05/12/2022   Personal history of colonic polyps 03/20/2022   Failed total knee, right, subsequent encounter 03/02/2022   Loose right total knee arthroplasty (HCC) 03/02/2022   Mixed stress and urge urinary incontinence 12/04/2021   Functional fecal incontinence 12/04/2021   IBS (irritable bowel syndrome) 11/19/2021   Spondylolisthesis, lumbar region    Other spondylosis with radiculopathy, lumbar region    Other secondary scoliosis, lumbar region    Fusion of spine of lumbar region 03/25/2021   Colon polyps 06/06/2020   Lupus (HCC) 06/06/2020   Allergic rhinitis due to animal (cat) (dog) hair and dander 04/08/2020   Allergic rhinitis due to pollen 04/08/2020   Food allergy 04/08/2020   Acute medial meniscus tear, left, subsequent encounter 03/21/2020   Chronic pain of left knee 02/15/2020   Paresthesia of skin 11/16/2019   History of total knee replacement, right 11/16/2019   Tobacco abuse 11/16/2019   Centrilobular emphysema (HCC) 08/10/2019   Incidental lung nodule, > 3mm and < 8mm 08/10/2019   OSA on CPAP 08/10/2019   Dyspnea on exertion 08/02/2019   Hyperlipidemia 08/02/2019   Lumbar radiculopathy 04/24/2019   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   Post laminectomy syndrome 02/23/2019   Abnormality of gait 02/23/2019   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   Unilateral primary osteoarthritis, left hip 12/28/2018   Lesion of skin of left ear 12/26/2018   Primary osteoarthritis of left hip 12/02/2018   Iron deficiency anemia 10/16/2018   Chronic pain syndrome 09/08/2018   Chronic pain of right knee 08/11/2018   Status post lumbar laminectomy 07/15/2018   Peripheral arterial disease  (HCC) 04/05/2018   Moderate persistent asthma without complication 06/29/2017   Environmental and seasonal allergies 06/29/2017   Controlled type 2 diabetes mellitus with diabetic polyneuropathy, without long-term current use of insulin (HCC) 06/29/2017   Perennial allergic rhinitis 04/08/2017   Sensorineural hearing loss (SNHL), bilateral 04/08/2017   Chronic pansinusitis 03/25/2017   Eustachian tube dysfunction, bilateral 03/25/2017   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   Herniation of lumbar intervertebral disc with radiculopathy 10/02/2016    Class: Chronic   Alopecia areata 08/19/2016   Chondromalacia of both patellae 06/03/2015    Class: Chronic   Spinal stenosis, lumbar region, with neurogenic claudication 06/03/2015   Tobacco use disorder 04/25/2015   DJD (degenerative joint disease) of knee 01/04/2015   Hemorrhoid 11/14/2014   Gout of big toe 07/19/2014   Essential hypertension 08/14/2013   Gastroesophageal reflux disease without esophagitis 08/14/2013   COPD (chronic obstructive pulmonary disease) (HCC) 04/17/2011    PCP: Jonah Blue  REFERRING PROVIDER: Thayer Ohm  Blackman  REFERRING DIAG: [phone] (ICD-10-CM) - Status post total right knee replacement   THERAPY DIAG:  No diagnosis found.  Rationale for Evaluation and Treatment: Rehabilitation  ONSET DATE: 05/12/22  SUBJECTIVE:   SUBJECTIVE STATEMENT: So they did another replacement on my R knee because it was all loose. I have had a replacement, 2 manipulation, and now a revision. I had a L hip replacement 2 years ago and back surgery a year ago. Friday was my last day in home therapy.   PERTINENT HISTORY: L hip replacement, L knee scope, R knee replacement and reduction now revision  PAIN:  Are you having pain? Yes: NPRS scale: 7/10 Pain location: R knee Pain description: throbbing, aching  Aggravating factors: standing or walking too long Relieving factors: ice, pain meds   PRECAUTIONS: None  WEIGHT BEARING  RESTRICTIONS: No  FALLS:  Has patient fallen in last 6 months? Yes. Number of falls 5, knees buckle on me   LIVING ENVIRONMENT: Lives with: lives with their spouse Lives in: House/apartment Stairs: Yes: Internal: 12 steps; on left going up Has following equipment at home: Single point cane, Walker - 2 wheeled, and Environmental consultant - 4 wheeled  OCCUPATION: Retired  PLOF: Independent with basic ADLs and Independent with household mobility with device  PATIENT GOALS: getting better and be able to move around better   NEXT MD VISIT: 06/24/22  OBJECTIVE:   DIAGNOSTIC FINDINGS: FINDINGS: Postsurgical changes of right knee arthroplasty revision with constrained long-stem arthroplasty. No evidence of loosening or fracture. Expected soft tissue changes.   IMPRESSION: Postsurgical changes of right knee arthroplasty revision. No evidence of immediate hardware complication  PATIENT SURVEYS:  FOTO 51  COGNITION: Overall cognitive status: Within functional limits for tasks assessed     SENSATION: WFL   MUSCLE LENGTH: Hamstrings: bilateral tightness  POSTURE: rounded shoulders   LOWER EXTREMITY ROM:  Active ROM Right eval Left eval  Hip flexion    Hip extension    Hip abduction    Hip adduction    Hip internal rotation    Hip external rotation    Knee flexion 95   Knee extension -18   Ankle dorsiflexion    Ankle plantarflexion    Ankle inversion    Ankle eversion     (Blank rows = not tested)  LOWER EXTREMITY MMT:  MMT Right eval Left eval  Hip flexion 3+   Hip extension    Hip abduction    Hip adduction    Hip internal rotation    Hip external rotation    Knee flexion 3+   Knee extension 3+    Ankle dorsiflexion    Ankle plantarflexion    Ankle inversion    Ankle eversion     (Blank rows = not tested)   FUNCTIONAL TESTS:  5 times sit to stand: 17.21s Timed up and go (TUG): 15.82s  GAIT: Distance walked: in clinic distances Assistive device utilized:  Environmental consultant - 4 wheeled and None Level of assistance: Complete Independence and Modified independence Comments: able to complete TUG without AD, uses rollator on a regular basis    TODAY'S TREATMENT:  DATE: EVAL 06/16/22    PATIENT EDUCATION:  Education details: HEP and POC Person educated: Patient Education method: Explanation Education comprehension: verbalized understanding  HOME EXERCISE PROGRAM: Access Code: [email or web address] URL: [email or web address] Date: 06/16/2022 Prepared by: Cassie Freer  Exercises - Mini Squat with Counter Support  - 1 x daily - 7 x weekly - 3 sets - 10 reps - Standing Hip Abduction with Unilateral Counter Support  - 1 x daily - 7 x weekly - 3 sets - 10 reps - Standing March with Unilateral Counter Support  - 1 x daily - 7 x weekly - 3 sets - 10 reps - Heel Raises with Counter Support  - 1 x daily - 7 x weekly - 3 sets - 10 reps - Sit to Stand  - 1 x daily - 7 x weekly - 3 sets - 10 reps - Seated Hamstring Stretch  - 1 x daily - 7 x weekly - 2 reps - 30 hold  ASSESSMENT:  CLINICAL IMPRESSION: Patient is a 62 y.o. female who was seen today for physical therapy evaluation and treatment for R total knee revision.    OBJECTIVE IMPAIRMENTS: Abnormal gait, decreased mobility, difficulty walking, decreased ROM, decreased strength, and pain.   REHAB POTENTIAL: Good  CLINICAL DECISION MAKING: Stable/uncomplicated  EVALUATION COMPLEXITY: Low   GOALS: Goals reviewed with patient? Yes  SHORT TERM GOALS: Target date: 07/28/22  Patient will be independent with initial HEP. Goal status: INITIAL  2.  Patient will demonstrate 5xSTS in < 13s  Baseline: 17.21s Goal status: INITIAL    LONG TERM GOALS: Target date: 09/01/22  Patient will be independent with advanced/ongoing HEP to improve outcomes and carryover.  Goal status:  INITIAL  2.  Patient will report at least 75% improvement in R knee pain to improve QOL. Baseline: 7/10 at worst Goal status: INITIAL  3.  Patient will demonstrate improved R knee AROM to >/= 0-120 deg to allow for normal gait and stair mechanics. Baseline: 18-0-95 Goal status: INITIAL  4.  Patient will demonstrate improved functional LE strength as demonstrated by 5/5. Baseline: 3+ Goal status: INITIAL  5.  Patient will be able to ambulate 600' with LRAD and normal gait pattern without increased pain to access community.  Baseline: using rollator walking Goal status: INITIAL  6. Patient will be able to do TUG <12s.  Baseline: 15.82s Goal status: INITIAL  7.  Patient will report 85 on FOTO (patient reported outcome measure) to demonstrate improved functional ability. Baseline: 51 Goal status: INITIAL   PLAN:  PT FREQUENCY: 2x/week  PT DURATION: 10 weeks  PLANNED INTERVENTIONS: Therapeutic exercises, Therapeutic activity, Neuromuscular re-education, Balance training, Gait training, Patient/Family education, Self Care, Joint mobilization, Stair training, Dry Needling, Cryotherapy, Moist heat, Vasopneumatic device, Ionotophoresis 4mg /ml Dexamethasone, and Manual therapy  PLAN FOR NEXT SESSION: start gym activities for R knee, work on ROM and [employer], PT 06/16/2022, 9:59 AM

## 2022-06-17 ENCOUNTER — Encounter: Payer: Self-pay | Admitting: Physician Assistant

## 2022-06-17 ENCOUNTER — Ambulatory Visit: Payer: Medicaid Other | Attending: Physician Assistant | Admitting: Physician Assistant

## 2022-06-17 VITALS — BP 126/64 | HR 93 | Ht 66.0 in | Wt 150.4 lb

## 2022-06-17 DIAGNOSIS — M329 Systemic lupus erythematosus, unspecified: Secondary | ICD-10-CM

## 2022-06-17 DIAGNOSIS — J3089 Other allergic rhinitis: Secondary | ICD-10-CM

## 2022-06-17 DIAGNOSIS — M109 Gout, unspecified: Secondary | ICD-10-CM

## 2022-06-17 DIAGNOSIS — J449 Chronic obstructive pulmonary disease, unspecified: Secondary | ICD-10-CM | POA: Diagnosis not present

## 2022-06-17 DIAGNOSIS — E876 Hypokalemia: Secondary | ICD-10-CM

## 2022-06-17 DIAGNOSIS — IMO0002 Reserved for concepts with insufficient information to code with codable children: Secondary | ICD-10-CM

## 2022-06-17 DIAGNOSIS — E1142 Type 2 diabetes mellitus with diabetic polyneuropathy: Secondary | ICD-10-CM

## 2022-06-17 DIAGNOSIS — I10 Essential (primary) hypertension: Secondary | ICD-10-CM

## 2022-06-17 DIAGNOSIS — Z7984 Long term (current) use of oral hypoglycemic drugs: Secondary | ICD-10-CM | POA: Diagnosis not present

## 2022-06-17 DIAGNOSIS — I739 Peripheral vascular disease, unspecified: Secondary | ICD-10-CM

## 2022-06-17 DIAGNOSIS — F419 Anxiety disorder, unspecified: Secondary | ICD-10-CM

## 2022-06-17 LAB — GLUCOSE, POCT (MANUAL RESULT ENTRY): POC Glucose: 108 mg/dl — AB (ref 70–99)

## 2022-06-17 MED ORDER — ALBUTEROL SULFATE (2.5 MG/3ML) 0.083% IN NEBU
INHALATION_SOLUTION | RESPIRATORY_TRACT | 0 refills | Status: DC
Start: 2022-06-17 — End: 2022-09-02

## 2022-06-17 MED ORDER — CLOPIDOGREL BISULFATE 75 MG PO TABS
75.0000 mg | ORAL_TABLET | Freq: Every day | ORAL | 5 refills | Status: DC
Start: 2022-06-17 — End: 2023-01-06

## 2022-06-17 MED ORDER — METFORMIN HCL ER 750 MG PO TB24
750.0000 mg | ORAL_TABLET | Freq: Every day | ORAL | 1 refills | Status: DC
Start: 2022-06-17 — End: 2022-09-07

## 2022-06-17 MED ORDER — HYDROXYZINE HCL 10 MG PO TABS: ORAL_TABLET | ORAL | 0 refills | Status: DC

## 2022-06-17 MED ORDER — VALSARTAN-HYDROCHLOROTHIAZIDE 160-12.5 MG PO TABS
ORAL_TABLET | ORAL | 0 refills | Status: DC
Start: 2022-06-17 — End: 2022-09-07

## 2022-06-17 MED ORDER — FERROUS SULFATE 325 (65 FE) MG PO TABS: ORAL_TABLET | ORAL | 1 refills | Status: DC

## 2022-06-17 MED ORDER — DAPAGLIFLOZIN PROPANEDIOL 5 MG PO TABS
5.0000 mg | ORAL_TABLET | Freq: Every day | ORAL | 1 refills | Status: DC
Start: 2022-06-17 — End: 2023-04-07

## 2022-06-17 MED ORDER — DULOXETINE HCL 60 MG PO CPEP: ORAL_CAPSULE | ORAL | 6 refills | Status: DC

## 2022-06-17 MED ORDER — TRIAMCINOLONE ACETONIDE 0.1 % EX CREA
1.0000 | TOPICAL_CREAM | Freq: Two times a day (BID) | CUTANEOUS | 1 refills | Status: AC | PRN
Start: 2022-06-17 — End: ?

## 2022-06-17 MED ORDER — POTASSIUM CHLORIDE CRYS ER 20 MEQ PO TBCR: 20.0000 meq | EXTENDED_RELEASE_TABLET | Freq: Every day | ORAL | 0 refills | Status: DC

## 2022-06-17 MED ORDER — TIOTROPIUM BROMIDE MONOHYDRATE 18 MCG IN CAPS: 18.0000 ug | ORAL_CAPSULE | Freq: Every day | RESPIRATORY_TRACT | 1 refills | Status: DC

## 2022-06-17 MED ORDER — FLUTICASONE PROPIONATE 50 MCG/ACT NA SUSP
1.0000 | Freq: Every day | NASAL | 6 refills | Status: DC
Start: 2022-06-17 — End: 2023-02-05

## 2022-06-17 MED ORDER — ALLOPURINOL 100 MG PO TABS
ORAL_TABLET | ORAL | 3 refills | Status: DC
Start: 2022-06-17 — End: 2023-06-01

## 2022-06-17 NOTE — Progress Notes (Signed)
Patient ID: Stacy [name], female   DOB: [birth year], 62 y.o.   MRN: [id number]     Stacy [name], is a 62 y.o. female  [id number]  [id number]  DOB - Dec 12, 1960  Chief Complaint  Patient presents with   Tremors   Medication Refill       Subjective:   Stacy [name] is a 62 y.o. female here today for med RF.  Still having trouble with tremor and has upcoming neurology appt.  Dr Laural Benes had ordered PTH, vitamin D, and TSH which she will have drawn today.  She was seen at the ED 06/01/2022 for constipation which has since resolved.  4 week s/p knee replacement.     No problems updated.  ALLERGIES: Allergies  Allergen Reactions   Other Shortness Of Breath    UNSPECIFIED AGENTS Allergic to perfumes and cleaning products   Shellfish Allergy Anaphylaxis    Per allergy test.   Ace Inhibitors Cough and Other (See Comments)        Celebrex [Celecoxib]      upset stomach  Pt is taking med*   Aspirin Nausea Only    stomach upset     PAST MEDICAL HISTORY: 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   Asthma    COPD (chronic obstructive pulmonary disease) (HCC)    Depression    Diabetes mellitus    Type II   GERD (gastroesophageal reflux disease)    Pt on Protonix daily   Glaucoma    Gout    Headache(784.0)    otc meds prn   Hyperlipidemia    Hypertension    Irritable bowel syndrome 11/19/2010   Neuropathy    Pneumonia YRS AGO   Restless legs    Shortness of breath    07/14/2018- uses  4 times a day   Sleep apnea     MEDICATIONS AT HOME: Prior to Admission medications   Medication Sig Start Date End Date Taking? Authorizing Provider  atorvastatin (LIPITOR) 40 MG tablet TAKE 1 TABLET (40 MG TOTAL) BY MOUTH DAILY. (BEDTIME) 04/30/22  Yes Marcine Matar, MD  Blood Glucose Monitoring Suppl (ACCU-CHEK AVIVA PLUS) w/Device KIT 1  each by Does not apply route 3 (three) times daily. 10/20/17  Yes Anders Simmonds, PA-C  celecoxib (CELEBREX) 200 MG capsule Take 200 mg by mouth 2 (two) times daily.   Yes [provider]  diclofenac Sodium (VOLTAREN) 1 % GEL Apply 2 g topically 4 (four) times daily. Patient taking differently: Apply 2 g topically 4 (four) times daily as needed (joint pain). 12/02/21  Yes Kerrin Champagne, MD  EPINEPHrine 0.3 mg/0.3 mL IJ SOAJ injection 0.3 mg IM x 1 PRN for allergic reaction 05/17/17  Yes Marcine Matar, MD  Glucagon 1 MG/0.2ML SOSY 1 mg arrange subcutaneously as needed for severe symptomatic low blood sugar 09/02/21  Yes Marcine Matar, MD  glucose blood (ACCU-CHEK AVIVA PLUS) test strip Use as instructed 11/24/21  Yes Marcine Matar, MD  glycopyrrolate (ROBINUL) 2 MG tablet Take 2 mg by mouth 3 (three) times daily. 11/04/21  Yes [provider]  Lancets (ACCU-CHEK MULTICLIX) lancets Use as instructed 11/24/21  Yes Marcine Matar, MD  Lancets (ACCU-CHEK SOFT TOUCH) lancets Use as instructed 10/20/17  Yes Anders Simmonds, PA-C  loratadine (CLARITIN) 10 MG tablet TAKE  1 TABLET (10 MG TOTAL) BY MOUTH DAILY. (AM) 04/03/22  Yes Marcine Matar, MD  melatonin 5 MG TABS Take 5 mg by mouth at bedtime.   Yes [provider]  methocarbamol (ROBAXIN) 500 MG tablet TAKE 1 TABLET (500 MG TOTAL) BY MOUTH EVERY 6 (SIX) HOURS AS NEEDED FOR MUSCLE SPASMS. 06/09/22  Yes Kirtland Bouchard, PA-C  Methylnaltrexone Bromide 8 MG/0.4ML SOLN Take 0.4 mLs by mouth as needed. Take once daily as needed 06/01/22  Yes Curley Spice, MD  montelukast (SINGULAIR) 10 MG tablet Take 10 mg by mouth daily.   Yes [provider]  Multiple Vitamins-Minerals (CENTRUM SILVER 50+WOMEN PO) Take 1 tablet by mouth daily.   Yes [provider]  oxyCODONE (OXY IR/ROXICODONE) 5 MG immediate release tablet Take 1-2 tablets (5-10 mg total) by mouth every 4 (four) hours as needed for moderate  pain (pain score 4-6). 05/27/22  Yes Kirtland Bouchard, PA-C  pantoprazole (PROTONIX) 40 MG tablet Take 40 mg by mouth daily.   Yes [provider]  Respiratory Therapy Supplies (FLUTTER) DEVI Use after breathing treatment 4 times daily 06/06/18  Yes Storm Frisk, MD  SYMBICORT 160-4.5 MCG/ACT inhaler Inhale 1 puff into the lungs 2 (two) times daily. 10/04/21  Yes [provider]  albuterol (PROVENTIL) (2.5 MG/3ML) 0.083% nebulizer solution Use as directed up to 3 times daily 06/17/22   Anders Simmonds, PA-C  allopurinol (ZYLOPRIM) 100 MG tablet TAKE 1 TABLET (100 MG TOTAL) BY MOUTH DAILY.(AM) 06/17/22   Anders Simmonds, PA-C  clopidogrel (PLAVIX) 75 MG tablet Take 1 tablet (75 mg total) by mouth daily. 06/17/22   Anders Simmonds, PA-C  dapagliflozin propanediol (FARXIGA) 5 MG TABS tablet Take 1 tablet (5 mg total) by mouth daily before breakfast. 06/17/22   Sharon Seller, Marzella Schlein, PA-C  DULoxetine (CYMBALTA) 60 MG capsule TAKE 1 CAPSULE (60 MG TOTAL) BY MOUTH DAILY (AM) 06/17/22   Sharena Dibenedetto, Marzella Schlein, PA-C  ferrous sulfate ([name]) 325 (65 FE) MG tablet TAKE ONE TABLET ( 325 MG ) BY MOUTH DAILY (AM) 06/17/22   Cayleigh Paull, Marzella Schlein, PA-C  fluticasone (FLONASE) 50 MCG/ACT nasal spray Place 1 spray into both nostrils daily. 06/17/22   Anders Simmonds, PA-C  hydrOXYzine (ATARAX) 10 MG tablet TAKE 1 TABLET IN THE MORNING AND NOON AND 2 TABLETS IN THE EVENING AS NEEDED. 06/17/22   Anders Simmonds, PA-C  metFORMIN (GLUCOPHAGE-XR) 750 MG 24 hr tablet Take 1 tablet (750 mg total) by mouth daily with breakfast. 06/17/22   Anders Simmonds, PA-C  potassium chloride SA (KLOR-CON M) 20 MEQ tablet Take 1 tablet (20 mEq total) by mouth daily. 06/17/22   Anders Simmonds, PA-C  tiotropium (SPIRIVA HANDIHALER) 18 MCG inhalation capsule Place 1 capsule (18 mcg total) into inhaler and inhale daily. 06/17/22   Anders Simmonds, PA-C  triamcinolone cream (KENALOG) 0.1 % Apply 1 Application topically 2 (two)  times daily as needed (irritation). 06/17/22   Anders Simmonds, PA-C  valsartan-hydrochlorothiazide (DIOVAN-HCT) 160-12.5 MG tablet TAKE ONE TABLET BY MOUTH ONCE DAILY (AM) 06/17/22   Edward Trevino, Marzella Schlein, PA-C  Fluticasone-Salmeterol (ADVAIR) 500-50 MCG/DOSE AEPB Inhale 1 puff into the lungs every 12 (twelve) hours.    04/16/11  [provider]  lisinopril (PRINIVIL,ZESTRIL) 40 MG tablet Take 40 mg by mouth daily.    04/16/11  [provider]    ROS: Neg HEENT Neg resp Neg cardiac Neg GI Neg GU  Objective:   Vitals:  06/17/22 0931  BP: 126/64  Pulse: 93  SpO2: 98%  Weight: 150 lb 6.4 oz (68.2 kg)  Height: 5\' 6"  (1.676 m)   Exam General appearance : Awake, alert, not in any distress. Speech Clear. Not toxic looking HEENT: Atraumatic and Normocephalic Neck: Supple, no JVD. No cervical lymphadenopathy.  Chest: Good air entry bilaterally, CTAB.  No rales/rhonchi/wheezing CVS: S1 S2 regular, no murmurs.  Extremities: B/L Lower Ext shows no edema, both legs are warm to touch Neurology: Awake alert, and oriented X 3, CN II-XII intact, Non focal.  Tremor is absent at rest but present with intention.  No pill-rolling.  Skin: No Rash  Data Review Lab Results  Component Value Date   HGBA1C 6.6 (H) 04/30/2022   HGBA1C 6.6 01/05/2022   HGBA1C 6.3 09/02/2021    Assessment & Plan   1. Controlled type 2 diabetes mellitus with diabetic polyneuropathy, without long-term current use of insulin (HCC) Blood sugar good today - Glucose (CBG)  2. Environmental and seasonal allergies - fluticasone (FLONASE) 50 MCG/ACT nasal spray; Place 1 spray into both nostrils daily.  Dispense: 16 g; Refill: 6  3. PAD (peripheral artery disease) (HCC) - clopidogrel (PLAVIX) 75 MG tablet; Take 1 tablet (75 mg total) by mouth daily.  Dispense: 30 tablet; Refill: 5  4. Gout of big toe - allopurinol (ZYLOPRIM) 100 MG tablet; TAKE 1 TABLET (100 MG TOTAL) BY MOUTH DAILY.(AM)  Dispense: 90  tablet; Refill: 3  5. Type 2 diabetes mellitus with peripheral neuropathy (HCC) - dapagliflozin propanediol (FARXIGA) 5 MG TABS tablet; Take 1 tablet (5 mg total) by mouth daily before breakfast.  Dispense: 90 tablet; Refill: 1 - metFORMIN (GLUCOPHAGE-XR) 750 MG 24 hr tablet; Take 1 tablet (750 mg total) by mouth daily with breakfast.  Dispense: 90 tablet; Refill: 1  6. Chronic obstructive pulmonary disease, unspecified COPD type (HCC) -flutter device given - albuterol (PROVENTIL) (2.5 MG/3ML) 0.083% nebulizer solution; Use as directed up to 3 times daily  Dispense: 360 mL; Refill: 0  7. Lupus (HCC) - triamcinolone cream (KENALOG) 0.1 %; Apply 1 Application topically 2 (two) times daily as needed (irritation).  Dispense: 45 g; Refill: 1  8. Essential hypertension controlled - valsartan-hydrochlorothiazide (DIOVAN-HCT) 160-12.5 MG tablet; TAKE ONE TABLET BY MOUTH ONCE DAILY (AM)  Dispense: 90 tablet; Refill: 0  9. Hypokalemia - potassium chloride SA (KLOR-CON M) 20 MEQ tablet; Take 1 tablet (20 mEq total) by mouth daily.  Dispense: 90 tablet; Refill: 0  10. Anxiety - hydrOXYzine (ATARAX) 10 MG tablet; TAKE 1 TABLET IN THE MORNING AND NOON AND 2 TABLETS IN THE EVENING AS NEEDED.  Dispense: 120 tablet; Refill: 0  11. Hypercalcemia - PTH, intact and calcium - TSH - VITAMIN D 25 Hydroxy (Vit-D Deficiency, Fractures)    Return for appt in August with PCP.  The patient was given clear instructions to go to ER or return to medical center if symptoms don't improve, worsen or new problems develop. The patient verbalized understanding. The patient was told to call to get lab results if they haven't heard anything in the next week.      Georgian Co, PA-C [facility] and [facility] [place], [place] [phone]   06/17/2022, 10:08 AM

## 2022-06-18 ENCOUNTER — Encounter: Payer: Self-pay | Admitting: Physical Therapy

## 2022-06-18 ENCOUNTER — Ambulatory Visit: Payer: Medicaid Other | Admitting: Physical Therapy

## 2022-06-18 DIAGNOSIS — M6281 Muscle weakness (generalized): Secondary | ICD-10-CM

## 2022-06-18 DIAGNOSIS — M25561 Pain in right knee: Secondary | ICD-10-CM

## 2022-06-18 DIAGNOSIS — R262 Difficulty in walking, not elsewhere classified: Secondary | ICD-10-CM

## 2022-06-18 DIAGNOSIS — R296 Repeated falls: Secondary | ICD-10-CM

## 2022-06-18 DIAGNOSIS — M25661 Stiffness of right knee, not elsewhere classified: Secondary | ICD-10-CM

## 2022-06-18 LAB — PTH, INTACT AND CALCIUM
Calcium: 9.6 mg/dL (ref 8.7–10.3)
PTH: 22 pg/mL (ref 15–65)

## 2022-06-18 LAB — TSH: TSH: 1.76 u[IU]/mL (ref 0.450–4.500)

## 2022-06-18 LAB — VITAMIN D 25 HYDROXY (VIT D DEFICIENCY, FRACTURES): Vit D, 25-Hydroxy: 39.8 ng/mL (ref 30.0–100.0)

## 2022-06-18 NOTE — Therapy (Signed)
OUTPATIENT PHYSICAL THERAPY LOWER EXTREMITY TREATMENT   Patient Name: Stacy [name] [facility] MRN: [id number] DOB:21-Aug-1960, 62 y.o., female Today's Date: 06/18/2022  END OF SESSION:  PT End of Session - 06/18/22 0845     Visit Number 2    Date for PT Re-Evaluation 09/01/22    PT Start Time 0845    PT Stop Time 0930    PT Time Calculation (min) 45 min    Activity Tolerance Patient tolerated treatment well    Behavior During Therapy WFL for tasks assessed/performed             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   Asthma    COPD (chronic obstructive pulmonary disease) (HCC)    Depression    Diabetes mellitus    Type II   GERD (gastroesophageal reflux disease)    Pt on Protonix daily   Glaucoma    Gout    Headache(784.0)    otc meds prn   Hyperlipidemia    Hypertension    Irritable bowel syndrome 11/19/2010   Neuropathy    Pneumonia YRS AGO   Restless legs    Shortness of breath    07/14/2018- uses  4 times a day   Sleep apnea    Past Surgical History:  Procedure Laterality Date   BACK SURGERY     feb 21, 23   CHOLECYSTECTOMY     COLONOSCOPY     ENDOMETRIAL ABLATION  10/2010   HERNIA REPAIR     umbicial hernia   JOINT REPLACEMENT Left 03/14/2019   Dr. Magnus Ivan hip   KNEE ARTHROSCOPY Left    06/07/2017 Dr. August [facility]   KNEE ARTHROSCOPY Left 03/21/2020   Procedure: LEFT KNEE ARTHROSCOPY WITH PARTIAL MEDIAL MENISCECTOMY;  Surgeon: [name], MD;  Location: [facility];  Service: Orthopedics;  Laterality: Left;   KNEE CLOSED REDUCTION Right 12/06/2015   Procedure: CLOSED MANIPULATION RIGHT KNEE;  Surgeon: Kerrin Champagne, MD;  Location: [facility] OR;  Service: Orthopedics;  Laterality: Right;   KNEE CLOSED REDUCTION Right 01/17/2016   Procedure: CLOSED MANIPULATION RIGHT KNEE;  Surgeon: Kerrin Champagne,  MD;  Location: [facility] OR;  Service: Orthopedics;  Laterality: Right;   KNEE JOINT MANIPULATION Right 12/06/2015   LACRIMAL TUBE INSERTION Bilateral 03/01/2019   Procedure: LACRIMAL TUBE INSERTION;  Surgeon: Aura Camps, MD;  Location: [facility];  Service: Ophthalmology;  Laterality: Bilateral;   LACRIMAL TUBE REMOVAL Bilateral 05/03/2019   Procedure: BILATERAL NASOLACRIMAL DUCT PROBING, IIRIGATION AND TUBE REMOVAL BOTH EYES;  Surgeon: Aura Camps, MD;  Location: [facility];  Service: Ophthalmology;  Laterality: Bilateral;   LUMBAR LAMINECTOMY/DECOMPRESSION MICRODISCECTOMY N/A 06/03/2015   Procedure: Bilateral lateral recess decompression L2-3, L3-4, L4-5;  Surgeon: Kerrin Champagne, MD;  Location: [facility];  Service: Orthopedics;  Laterality: N/A;   LUMBAR LAMINECTOMY/DECOMPRESSION MICRODISCECTOMY N/A 10/02/2016   Procedure: Right L5-S1 Lateral Recess Decompression  microdiscectomy;  Surgeon: Kerrin Champagne, MD;  Location: [facility] OR;  Service: Orthopedics;  Laterality: N/A;   LUMBAR LAMINECTOMY/DECOMPRESSION MICRODISCECTOMY N/A 07/15/2018   Procedure: LEFT L3-4 MICRODISCECTOMY;  Surgeon: Kerrin Champagne, MD;  Location: [facility] OR;  Service: Orthopedics;  Laterality: N/A;   svd      x 2   TEAR DUCT PROBING Bilateral 03/01/2019   Procedure: TEAR DUCT PROBING  WITH IRRIGATION;  Surgeon: Aura Camps, MD;  Location: [facility];  Service: Ophthalmology;  Laterality: Bilateral;   TOTAL HIP ARTHROPLASTY Left 03/14/2019   Procedure: LEFT TOTAL HIP ARTHROPLASTY ANTERIOR APPROACH;  Surgeon: [name], MD;  Location: [facility];  Service: Orthopedics;  Laterality: Left;   TOTAL KNEE ARTHROPLASTY Right 09/06/2015   Procedure: RIGHT TOTAL KNEE ARTHROPLASTY;  Surgeon: Kerrin Champagne, MD;  Location: [facility];  Service: Orthopedics;  Laterality: Right;   TOTAL KNEE REVISION Right 05/12/2022   Procedure: RIGHT TOTAL KNEE REVISION ARTHROPLASTY;  Surgeon: [name], MD;  Location: [facility];  Service: Orthopedics;  Laterality: Right;   TUBAL LIGATION     UPPER GASTROINTESTINAL ENDOSCOPY  04/28/2011   Patient Active Problem List   Diagnosis Date Noted   Status post revision of total replacement of right knee 05/12/2022   Personal history of colonic polyps 03/20/2022   Failed total knee, right, subsequent encounter 03/02/2022   Loose right total knee arthroplasty (HCC) 03/02/2022   Mixed stress and urge urinary incontinence 12/04/2021   Functional fecal incontinence 12/04/2021   IBS (irritable bowel syndrome) 11/19/2021   Spondylolisthesis, lumbar region    Other spondylosis with radiculopathy, lumbar region    Other secondary scoliosis, lumbar region    Fusion of spine of lumbar region 03/25/2021   Colon polyps 06/06/2020   Lupus (HCC) 06/06/2020   Allergic rhinitis due to animal (cat) (dog) hair and dander 04/08/2020   Allergic rhinitis due to pollen 04/08/2020   Food allergy 04/08/2020   Acute medial meniscus tear, left, subsequent encounter 03/21/2020   Chronic pain of left knee 02/15/2020   Paresthesia of skin 11/16/2019   History of total knee replacement, right 11/16/2019   Tobacco abuse 11/16/2019   Centrilobular emphysema (HCC) 08/10/2019   Incidental lung nodule, > 3mm and < 8mm 08/10/2019   OSA on CPAP 08/10/2019   Dyspnea on exertion 08/02/2019   Hyperlipidemia 08/02/2019   Lumbar radiculopathy 04/24/2019   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   Post laminectomy syndrome 02/23/2019   Abnormality of gait 02/23/2019   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   Unilateral primary osteoarthritis, left hip 12/28/2018   Lesion of skin of left ear 12/26/2018   Primary osteoarthritis of left hip 12/02/2018   Iron deficiency anemia 10/16/2018   Chronic pain syndrome 09/08/2018   Chronic pain of right knee 08/11/2018   Status post lumbar laminectomy 07/15/2018   Peripheral arterial disease (HCC) 04/05/2018   Moderate  persistent asthma without complication 06/29/2017   Environmental and seasonal allergies 06/29/2017   Controlled type 2 diabetes mellitus with diabetic polyneuropathy, without long-term current use of insulin (HCC) 06/29/2017   Perennial allergic rhinitis 04/08/2017   Sensorineural hearing loss (SNHL), bilateral 04/08/2017   Chronic pansinusitis 03/25/2017   Eustachian tube dysfunction, bilateral 03/25/2017   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   Herniation of lumbar intervertebral disc with radiculopathy 10/02/2016    Class: Chronic   Alopecia areata 08/19/2016   Chondromalacia of both patellae 06/03/2015    Class: Chronic   Spinal stenosis, lumbar region, with neurogenic claudication 06/03/2015   Tobacco use disorder 04/25/2015   DJD (degenerative joint disease) of knee 01/04/2015   Hemorrhoid 11/14/2014   Gout of big toe 07/19/2014   Essential hypertension 08/14/2013   Gastroesophageal reflux disease without esophagitis 08/14/2013   COPD (chronic obstructive pulmonary disease) (HCC) 04/17/2011    PCP: Jonah Blue  REFERRING PROVIDER: Allie Bossier  REFERRING DIAG: [phone] (ICD-10-CM) -  Status post total right knee replacement   THERAPY DIAG:  Acute pain of right knee  Stiffness of right knee, not elsewhere classified  Muscle weakness (generalized)  Difficulty in walking, not elsewhere classified  Repeated falls  Rationale for Evaluation and Treatment: Rehabilitation  ONSET DATE: 05/12/22  SUBJECTIVE:   SUBJECTIVE STATEMENT: "I feel good"   PERTINENT HISTORY: L hip replacement, L knee scope, R knee replacement and reduction now revision  PAIN:  Are you having pain? Yes: NPRS scale: 0/10 Pain location: R knee Pain description: throbbing, aching  Aggravating factors: standing or walking too long Relieving factors: ice, pain meds   PRECAUTIONS: None  WEIGHT BEARING RESTRICTIONS: No  FALLS:  Has patient fallen in last 6 months? Yes. Number of falls 5, knees  buckle on me   LIVING ENVIRONMENT: Lives with: lives with their spouse Lives in: House/apartment Stairs: Yes: Internal: 12 steps; on left going up Has following equipment at home: Single point cane, Walker - 2 wheeled, and Environmental consultant - 4 wheeled  OCCUPATION: Retired  PLOF: Independent with basic ADLs and Independent with household mobility with device  PATIENT GOALS: getting better and be able to move around better   NEXT MD VISIT: 06/24/22  OBJECTIVE:   DIAGNOSTIC FINDINGS: FINDINGS: Postsurgical changes of right knee arthroplasty revision with constrained long-stem arthroplasty. No evidence of loosening or fracture. Expected soft tissue changes.   IMPRESSION: Postsurgical changes of right knee arthroplasty revision. No evidence of immediate hardware complication  PATIENT SURVEYS:  FOTO 51  COGNITION: Overall cognitive status: Within functional limits for tasks assessed     SENSATION: WFL   MUSCLE LENGTH: Hamstrings: bilateral tightness  POSTURE: rounded shoulders   LOWER EXTREMITY ROM:  Active ROM Right eval Left eval  Hip flexion    Hip extension    Hip abduction    Hip adduction    Hip internal rotation    Hip external rotation    Knee flexion 95   Knee extension -18   Ankle dorsiflexion    Ankle plantarflexion    Ankle inversion    Ankle eversion     (Blank rows = not tested)  LOWER EXTREMITY MMT:  MMT Right eval Left eval  Hip flexion 3+   Hip extension    Hip abduction    Hip adduction    Hip internal rotation    Hip external rotation    Knee flexion 3+   Knee extension 3+    Ankle dorsiflexion    Ankle plantarflexion    Ankle inversion    Ankle eversion     (Blank rows = not tested)   FUNCTIONAL TESTS:  5 times sit to stand: 17.21s Timed up and go (TUG): 15.82s  GAIT: Distance walked: in clinic distances Assistive device utilized: Environmental consultant - 4 wheeled and None Level of assistance: Complete Independence and Modified  independence Comments: able to complete TUG without AD, uses rollator on a regular basis    TODAY'S TREATMENT:  DATE:  06/18/22 NuStep L 4 x 6 min R knee PROM with end range holds, patella mobs LAQ RLE 2lb 2x10 HS curle RLE green 2x12 S2S 2x12 Hip abd ball squeeze 2x12 Standing march 2x10    EVAL 06/16/22    PATIENT EDUCATION:  Education details: HEP and POC Person educated: Patient Education method: Explanation Education comprehension: verbalized understanding  HOME EXERCISE PROGRAM: Access Code: [email or web address] URL: [email or web address] Date: 06/16/2022 Prepared by: Cassie Freer  Exercises - Mini Squat with Counter Support  - 1 x daily - 7 x weekly - 3 sets - 10 reps - Standing Hip Abduction with Unilateral Counter Support  - 1 x daily - 7 x weekly - 3 sets - 10 reps - Standing March with Unilateral Counter Support  - 1 x daily - 7 x weekly - 3 sets - 10 reps - Heel Raises with Counter Support  - 1 x daily - 7 x weekly - 3 sets - 10 reps - Sit to Stand  - 1 x daily - 7 x weekly - 3 sets - 10 reps - Seated Hamstring Stretch  - 1 x daily - 7 x weekly - 2 reps - 30 hold  ASSESSMENT:  CLINICAL IMPRESSION: Patient is a 62 y.o. female who was seen today for physical therapy treatment for R total knee revision. She sis well overall with a initial progression to TE. R knee does lack some extension. Little pain reported with PROM. Good effort throughout session. Cues to hold muscle contraction with LAQ.  OBJECTIVE IMPAIRMENTS: Abnormal gait, decreased mobility, difficulty walking, decreased ROM, decreased strength, and pain.   REHAB POTENTIAL: Good  CLINICAL DECISION MAKING: Stable/uncomplicated  EVALUATION COMPLEXITY: Low   GOALS: Goals reviewed with patient? Yes  SHORT TERM GOALS: Target date: 07/28/22  Patient will be independent with  initial HEP. Goal status: INITIAL  2.  Patient will demonstrate 5xSTS in < 13s  Baseline: 17.21s Goal status: INITIAL    LONG TERM GOALS: Target date: 09/01/22  Patient will be independent with advanced/ongoing HEP to improve outcomes and carryover.  Goal status: INITIAL  2.  Patient will report at least 75% improvement in R knee pain to improve QOL. Baseline: 7/10 at worst Goal status: INITIAL  3.  Patient will demonstrate improved R knee AROM to >/= 0-120 deg to allow for normal gait and stair mechanics. Baseline: 18-0-95 Goal status: INITIAL  4.  Patient will demonstrate improved functional LE strength as demonstrated by 5/5. Baseline: 3+ Goal status: INITIAL  5.  Patient will be able to ambulate 600' with LRAD and normal gait pattern without increased pain to access community.  Baseline: using rollator walking Goal status: INITIAL  6. Patient will be able to do TUG <12s.  Baseline: 15.82s Goal status: INITIAL  7.  Patient will report 45 on FOTO (patient reported outcome measure) to demonstrate improved functional ability. Baseline: 51 Goal status: INITIAL   PLAN:  PT FREQUENCY: 2x/week  PT DURATION: 10 weeks  PLANNED INTERVENTIONS: Therapeutic exercises, Therapeutic activity, Neuromuscular re-education, Balance training, Gait training, Patient/Family education, Self Care, Joint mobilization, Stair training, Dry Needling, Cryotherapy, Moist heat, Vasopneumatic device, Ionotophoresis 4mg /ml Dexamethasone, and Manual therapy  PLAN FOR NEXT SESSION: start gym activities for R knee, work on ROM and strengthening    Grayce Sessions, PTA 06/18/2022, 8:45 AM

## 2022-06-20 DIAGNOSIS — T84012D Broken internal right knee prosthesis, subsequent encounter: Secondary | ICD-10-CM | POA: Diagnosis not present

## 2022-06-22 ENCOUNTER — Encounter: Payer: Self-pay | Admitting: Physical Therapy

## 2022-06-22 ENCOUNTER — Ambulatory Visit: Payer: Medicaid Other | Admitting: Physical Therapy

## 2022-06-22 DIAGNOSIS — M6281 Muscle weakness (generalized): Secondary | ICD-10-CM

## 2022-06-22 DIAGNOSIS — M25561 Pain in right knee: Secondary | ICD-10-CM

## 2022-06-22 DIAGNOSIS — M25661 Stiffness of right knee, not elsewhere classified: Secondary | ICD-10-CM

## 2022-06-22 DIAGNOSIS — R296 Repeated falls: Secondary | ICD-10-CM

## 2022-06-22 DIAGNOSIS — R262 Difficulty in walking, not elsewhere classified: Secondary | ICD-10-CM

## 2022-06-22 NOTE — Therapy (Signed)
OUTPATIENT PHYSICAL THERAPY LOWER EXTREMITY TREATMENT   Patient Name: [name] MRN: [id number] DOB:[birth year], 62 y.o., female Today's Date: 06/22/2022  END OF SESSION:  PT End of Session - 06/22/22 1104     Visit Number 3    Date for PT Re-Evaluation 09/01/22    PT Start Time 1105    PT Stop Time 1150    PT Time Calculation (min) 45 min    Activity Tolerance Patient tolerated treatment well    Behavior During Therapy WFL for tasks assessed/performed             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   Asthma    COPD (chronic obstructive pulmonary disease) (HCC)    Depression    Diabetes mellitus    Type II   GERD (gastroesophageal reflux disease)    Pt on Protonix daily   Glaucoma    Gout    Headache(784.0)    otc meds prn   Hyperlipidemia    Hypertension    Irritable bowel syndrome 11/19/2010   Neuropathy    Pneumonia YRS AGO   Restless legs    Shortness of breath    07/14/2018- uses  4 times a day   Sleep apnea    Past Surgical History:  Procedure Laterality Date   BACK SURGERY     feb 21, 23   CHOLECYSTECTOMY     COLONOSCOPY     ENDOMETRIAL ABLATION  10/2010   HERNIA REPAIR     umbicial hernia   JOINT REPLACEMENT Left 03/14/2019   Dr. Magnus Ivan hip   KNEE ARTHROSCOPY Left    06/07/2017 Dr. August [facility]   KNEE ARTHROSCOPY Left 03/21/2020   Procedure: LEFT KNEE ARTHROSCOPY WITH PARTIAL MEDIAL MENISCECTOMY;  Surgeon: [name], MD;  Location: [facility];  Service: Orthopedics;  Laterality: Left;   KNEE CLOSED REDUCTION Right 12/06/2015   Procedure: CLOSED MANIPULATION RIGHT KNEE;  Surgeon: Kerrin Champagne, MD;  Location: [facility];  Service: Orthopedics;  Laterality: Right;   KNEE CLOSED REDUCTION Right 01/17/2016   Procedure: CLOSED MANIPULATION RIGHT KNEE;  Surgeon: Kerrin Champagne,  MD;  Location: [facility];  Service: Orthopedics;  Laterality: Right;   KNEE JOINT MANIPULATION Right 12/06/2015   LACRIMAL TUBE INSERTION Bilateral 03/01/2019   Procedure: LACRIMAL TUBE INSERTION;  Surgeon: Aura Camps, MD;  Location: [facility];  Service: Ophthalmology;  Laterality: Bilateral;   LACRIMAL TUBE REMOVAL Bilateral 05/03/2019   Procedure: BILATERAL NASOLACRIMAL DUCT PROBING, IIRIGATION AND TUBE REMOVAL BOTH EYES;  Surgeon: Aura Camps, MD;  Location: [facility];  Service: Ophthalmology;  Laterality: Bilateral;   LUMBAR LAMINECTOMY/DECOMPRESSION MICRODISCECTOMY N/A 06/03/2015   Procedure: Bilateral lateral recess decompression L2-3, L3-4, L4-5;  Surgeon: Kerrin Champagne, MD;  Location: [facility];  Service: Orthopedics;  Laterality: N/A;   LUMBAR LAMINECTOMY/DECOMPRESSION MICRODISCECTOMY N/A 10/02/2016   Procedure: Right L5-S1 Lateral Recess Decompression  microdiscectomy;  Surgeon: Kerrin Champagne, MD;  Location: [facility] OR;  Service: Orthopedics;  Laterality: N/A;   LUMBAR LAMINECTOMY/DECOMPRESSION MICRODISCECTOMY N/A 07/15/2018   Procedure: LEFT L3-4 MICRODISCECTOMY;  Surgeon: Kerrin Champagne, MD;  Location: [facility] OR;  Service: Orthopedics;  Laterality: N/A;   svd      x 2   TEAR DUCT PROBING Bilateral 03/01/2019   Procedure: TEAR DUCT PROBING  WITH IRRIGATION;  Surgeon: Aura Camps, MD;  Location: [facility];  Service: Ophthalmology;  Laterality: Bilateral;   TOTAL HIP ARTHROPLASTY Left 03/14/2019   Procedure: LEFT TOTAL HIP ARTHROPLASTY ANTERIOR APPROACH;  Surgeon: [name], MD;  Location: [facility];  Service: Orthopedics;  Laterality: Left;   TOTAL KNEE ARTHROPLASTY Right 09/06/2015   Procedure: RIGHT TOTAL KNEE ARTHROPLASTY;  Surgeon: Kerrin Champagne, MD;  Location: [facility];  Service: Orthopedics;  Laterality: Right;   TOTAL KNEE REVISION Right 05/12/2022   Procedure: RIGHT TOTAL KNEE REVISION ARTHROPLASTY;  Surgeon: [name], MD;  Location: [facility];  Service: Orthopedics;  Laterality: Right;   TUBAL LIGATION     UPPER GASTROINTESTINAL ENDOSCOPY  04/28/2011   Patient Active Problem List   Diagnosis Date Noted   Status post revision of total replacement of right knee 05/12/2022   Personal history of colonic polyps 03/20/2022   Failed total knee, right, subsequent encounter 03/02/2022   Loose right total knee arthroplasty (HCC) 03/02/2022   Mixed stress and urge urinary incontinence 12/04/2021   Functional fecal incontinence 12/04/2021   IBS (irritable bowel syndrome) 11/19/2021   Spondylolisthesis, lumbar region    Other spondylosis with radiculopathy, lumbar region    Other secondary scoliosis, lumbar region    Fusion of spine of lumbar region 03/25/2021   Colon polyps 06/06/2020   Lupus (HCC) 06/06/2020   Allergic rhinitis due to animal (cat) (dog) hair and dander 04/08/2020   Allergic rhinitis due to pollen 04/08/2020   Food allergy 04/08/2020   Acute medial meniscus tear, left, subsequent encounter 03/21/2020   Chronic pain of left knee 02/15/2020   Paresthesia of skin 11/16/2019   History of total knee replacement, right 11/16/2019   Tobacco abuse 11/16/2019   Centrilobular emphysema (HCC) 08/10/2019   Incidental lung nodule, > 3mm and < 8mm 08/10/2019   OSA on CPAP 08/10/2019   Dyspnea on exertion 08/02/2019   Hyperlipidemia 08/02/2019   Lumbar radiculopathy 04/24/2019   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   Post laminectomy syndrome 02/23/2019   Abnormality of gait 02/23/2019   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   Unilateral primary osteoarthritis, left hip 12/28/2018   Lesion of skin of left ear 12/26/2018   Primary osteoarthritis of left hip 12/02/2018   Iron deficiency anemia 10/16/2018   Chronic pain syndrome 09/08/2018   Chronic pain of right knee 08/11/2018   Status post lumbar laminectomy 07/15/2018   Peripheral arterial disease (HCC) 04/05/2018   Moderate  persistent asthma without complication 06/29/2017   Environmental and seasonal allergies 06/29/2017   Controlled type 2 diabetes mellitus with diabetic polyneuropathy, without long-term current use of insulin (HCC) 06/29/2017   Perennial allergic rhinitis 04/08/2017   Sensorineural hearing loss (SNHL), bilateral 04/08/2017   Chronic pansinusitis 03/25/2017   Eustachian tube dysfunction, bilateral 03/25/2017   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   Herniation of lumbar intervertebral disc with radiculopathy 10/02/2016    Class: Chronic   Alopecia areata 08/19/2016   Chondromalacia of both patellae 06/03/2015    Class: Chronic   Spinal stenosis, lumbar region, with neurogenic claudication 06/03/2015   Tobacco use disorder 04/25/2015   DJD (degenerative joint disease) of knee 01/04/2015   Hemorrhoid 11/14/2014   Gout of big toe 07/19/2014   Essential hypertension 08/14/2013   Gastroesophageal reflux disease without esophagitis 08/14/2013   COPD (chronic obstructive pulmonary disease) (HCC) 04/17/2011    PCP: Jonah Blue  REFERRING PROVIDER: Allie Bossier  REFERRING DIAG: [phone] (ICD-10-CM) -  Status post total right knee replacement   THERAPY DIAG:  Acute pain of right knee  Stiffness of right knee, not elsewhere classified  Muscle weakness (generalized)  Difficulty in walking, not elsewhere classified  Repeated falls  Rationale for Evaluation and Treatment: Rehabilitation  ONSET DATE: 05/12/22  SUBJECTIVE:   SUBJECTIVE STATEMENT: "I feel good"   PERTINENT HISTORY: L hip replacement, L knee scope, R knee replacement and reduction now revision  PAIN:  Are you having pain? Yes: NPRS scale: 0/10 Pain location: R knee Pain description: throbbing, aching  Aggravating factors: standing or walking too long Relieving factors: ice, pain meds   PRECAUTIONS: None  WEIGHT BEARING RESTRICTIONS: No  FALLS:  Has patient fallen in last 6 months? Yes. Number of falls 5, knees  buckle on me   LIVING ENVIRONMENT: Lives with: lives with their spouse Lives in: House/apartment Stairs: Yes: Internal: 12 steps; on left going up Has following equipment at home: Single point cane, Walker - 2 wheeled, and Environmental consultant - 4 wheeled  OCCUPATION: Retired  PLOF: Independent with basic ADLs and Independent with household mobility with device  PATIENT GOALS: getting better and be able to move around better   NEXT MD VISIT: 06/24/22  OBJECTIVE:   DIAGNOSTIC FINDINGS: FINDINGS: Postsurgical changes of right knee arthroplasty revision with constrained long-stem arthroplasty. No evidence of loosening or fracture. Expected soft tissue changes.   IMPRESSION: Postsurgical changes of right knee arthroplasty revision. No evidence of immediate hardware complication  PATIENT SURVEYS:  FOTO 51  COGNITION: Overall cognitive status: Within functional limits for tasks assessed     SENSATION: WFL   MUSCLE LENGTH: Hamstrings: bilateral tightness  POSTURE: rounded shoulders   LOWER EXTREMITY ROM:  Active ROM Right eval Left eval  Hip flexion    Hip extension    Hip abduction    Hip adduction    Hip internal rotation    Hip external rotation    Knee flexion 95   Knee extension -18   Ankle dorsiflexion    Ankle plantarflexion    Ankle inversion    Ankle eversion     (Blank rows = not tested)  LOWER EXTREMITY MMT:  MMT Right eval Left eval  Hip flexion 3+   Hip extension    Hip abduction    Hip adduction    Hip internal rotation    Hip external rotation    Knee flexion 3+   Knee extension 3+    Ankle dorsiflexion    Ankle plantarflexion    Ankle inversion    Ankle eversion     (Blank rows = not tested)   FUNCTIONAL TESTS:  5 times sit to stand: 17.21s Timed up and go (TUG): 15.82s  GAIT: Distance walked: in clinic distances Assistive device utilized: Environmental consultant - 4 wheeled and None Level of assistance: Complete Independence and Modified  independence Comments: able to complete TUG without AD, uses rollator on a regular basis    TODAY'S TREATMENT:  DATE:  06/22/22 R knee PROM with end range holds, patella mobs LAQ RLE 3lb 3x10 HS green 2x15 NuStep L 5 x 6 min  Forward & Lateral step ups 4in box 2x10 Heel raise black bar 2x12 Bike L 1 x 3 min  S2S 2x10  06/18/22 NuStep L 4 x 6 min R knee PROM with end range holds, patella mobs LAQ RLE 2lb 2x10 HS curle RLE green 2x12 S2S 2x12 Hip abd ball squeeze 2x12 Standing march 2x10    EVAL 06/16/22    PATIENT EDUCATION:  Education details: HEP and POC Person educated: Patient Education method: Explanation Education comprehension: verbalized understanding  HOME EXERCISE PROGRAM: Access Code: [email or web address] URL: [email or web address] Date: 06/16/2022 Prepared by: Cassie Freer  Exercises - Mini Squat with Counter Support  - 1 x daily - 7 x weekly - 3 sets - 10 reps - Standing Hip Abduction with Unilateral Counter Support  - 1 x daily - 7 x weekly - 3 sets - 10 reps - Standing March with Unilateral Counter Support  - 1 x daily - 7 x weekly - 3 sets - 10 reps - Heel Raises with Counter Support  - 1 x daily - 7 x weekly - 3 sets - 10 reps - Sit to Stand  - 1 x daily - 7 x weekly - 3 sets - 10 reps - Seated Hamstring Stretch  - 1 x daily - 7 x weekly - 2 reps - 30 hold  ASSESSMENT:  CLINICAL IMPRESSION: Patient is a 62 y.o. female who was seen today for physical therapy treatment for R total knee revision. Little pain reported with PROM. Pt able to make full revolutions on bike. Compensation noted evident by L leaning with sit to stands. Good effort throughout session. Cues to hold muscle contraction with LAQ. SBA needed wit to step ups  OBJECTIVE IMPAIRMENTS: Abnormal gait, decreased mobility, difficulty walking, decreased ROM, decreased  strength, and pain.   REHAB POTENTIAL: Good  CLINICAL DECISION MAKING: Stable/uncomplicated  EVALUATION COMPLEXITY: Low   GOALS: Goals reviewed with patient? Yes  SHORT TERM GOALS: Target date: 07/28/22  Patient will be independent with initial HEP. Goal status: INITIAL  2.  Patient will demonstrate 5xSTS in < 13s  Baseline: 17.21s Goal status: INITIAL    LONG TERM GOALS: Target date: 09/01/22  Patient will be independent with advanced/ongoing HEP to improve outcomes and carryover.  Goal status: INITIAL  2.  Patient will report at least 75% improvement in R knee pain to improve QOL. Baseline: 7/10 at worst Goal status: INITIAL  3.  Patient will demonstrate improved R knee AROM to >/= 0-120 deg to allow for normal gait and stair mechanics. Baseline: 18-0-95 Goal status: INITIAL  4.  Patient will demonstrate improved functional LE strength as demonstrated by 5/5. Baseline: 3+ Goal status: INITIAL  5.  Patient will be able to ambulate 600' with LRAD and normal gait pattern without increased pain to access community.  Baseline: using rollator walking Goal status: INITIAL  6. Patient will be able to do TUG <12s.  Baseline: 15.82s Goal status: INITIAL  7.  Patient will report 18 on FOTO (patient reported outcome measure) to demonstrate improved functional ability. Baseline: 51 Goal status: INITIAL   PLAN:  PT FREQUENCY: 2x/week  PT DURATION: 10 weeks  PLANNED INTERVENTIONS: Therapeutic exercises, Therapeutic activity, Neuromuscular re-education, Balance training, Gait training, Patient/Family education, Self Care, Joint mobilization, Stair training, Dry Needling, Cryotherapy, Moist heat, Vasopneumatic device, Ionotophoresis 4mg /ml Dexamethasone, and Manual therapy  PLAN FOR NEXT SESSION: start gym activities for R knee, work on ROM and strengthening    Grayce Sessions, PTA 06/22/2022, 11:04 AM

## 2022-06-24 ENCOUNTER — Ambulatory Visit (INDEPENDENT_AMBULATORY_CARE_PROVIDER_SITE_OTHER): Payer: Medicaid Other | Admitting: Orthopaedic Surgery

## 2022-06-24 ENCOUNTER — Encounter: Payer: Self-pay | Admitting: Orthopaedic Surgery

## 2022-06-24 ENCOUNTER — Ambulatory Visit: Payer: Medicaid Other | Admitting: *Deleted

## 2022-06-24 DIAGNOSIS — Z96651 Presence of right artificial knee joint: Secondary | ICD-10-CM

## 2022-06-24 NOTE — Progress Notes (Signed)
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  She is ambulate with a rolling walker and doing well with physical therapy.  She says her left knee has been buckling some.  She has not had any type of replacement of her left knee.  Her right knee revision arthroplasty still has some swelling to be expected but I am very pleased with her motion thus far.  She lacks full extension by few degrees and we can flex her well past 90 degrees even past 100 degrees.  The left knee does hyperextend.  There is no effusion with the left knee and it feels as if there is some laxity of that left knee.  I recommended potentially just an over-the-counter knee sleeve for stability purposes.  From my standpoint she will continue therapy to strengthen both of her knees.  Will see her back in 2 months and that visit we will have a standing AP and lateral of her right knee and we can always x-ray the left knee as well if she still symptomatic.

## 2022-06-25 ENCOUNTER — Encounter: Payer: Self-pay | Admitting: Physical Therapy

## 2022-06-25 ENCOUNTER — Ambulatory Visit: Payer: Medicaid Other | Admitting: Physical Therapy

## 2022-06-25 DIAGNOSIS — R262 Difficulty in walking, not elsewhere classified: Secondary | ICD-10-CM

## 2022-06-25 DIAGNOSIS — M6281 Muscle weakness (generalized): Secondary | ICD-10-CM

## 2022-06-25 DIAGNOSIS — M25561 Pain in right knee: Secondary | ICD-10-CM

## 2022-06-25 DIAGNOSIS — M25661 Stiffness of right knee, not elsewhere classified: Secondary | ICD-10-CM

## 2022-06-25 NOTE — Therapy (Signed)
OUTPATIENT PHYSICAL THERAPY LOWER EXTREMITY TREATMENT   Patient Name: Stacy [name] [facility] MRN: [id number] DOB:[birth year], 62 y.o., female Today's Date: 06/25/2022  END OF SESSION:  PT End of Session - 06/25/22 0753     Visit Number 4    Date for PT Re-Evaluation 09/01/22    PT Start Time 0750    PT Stop Time 0845    PT Time Calculation (min) 55 min    Activity Tolerance Patient tolerated treatment well    Behavior During Therapy [facility] for tasks assessed/performed             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   Asthma    COPD (chronic obstructive pulmonary disease) (HCC)    Depression    Diabetes mellitus    Type II   GERD (gastroesophageal reflux disease)    Pt on Protonix daily   Glaucoma    Gout    Headache(784.0)    otc meds prn   Hyperlipidemia    Hypertension    Irritable bowel syndrome 11/19/2010   Neuropathy    Pneumonia YRS AGO   Restless legs    Shortness of breath    07/14/2018- uses  4 times a day   Sleep apnea    Past Surgical History:  Procedure Laterality Date   BACK SURGERY     feb 21, 23   CHOLECYSTECTOMY     COLONOSCOPY     ENDOMETRIAL ABLATION  10/2010   HERNIA REPAIR     umbicial hernia   JOINT REPLACEMENT Left 03/14/2019   Dr. Magnus Ivan hip   KNEE ARTHROSCOPY Left    06/07/2017 Dr. August [facility]   KNEE ARTHROSCOPY Left 03/21/2020   Procedure: LEFT KNEE ARTHROSCOPY WITH PARTIAL MEDIAL MENISCECTOMY;  Surgeon: [name], MD;  Location: [facility];  Service: Orthopedics;  Laterality: Left;   KNEE CLOSED REDUCTION Right 12/06/2015   Procedure: CLOSED MANIPULATION RIGHT KNEE;  Surgeon: Kerrin Champagne, MD;  Location: [facility];  Service: Orthopedics;  Laterality: Right;   KNEE CLOSED REDUCTION Right 01/17/2016   Procedure: CLOSED MANIPULATION RIGHT KNEE;  Surgeon: Kerrin Champagne,  MD;  Location: [facility] OR;  Service: Orthopedics;  Laterality: Right;   KNEE JOINT MANIPULATION Right 12/06/2015   LACRIMAL TUBE INSERTION Bilateral 03/01/2019   Procedure: LACRIMAL TUBE INSERTION;  Surgeon: Aura Camps, MD;  Location: [facility];  Service: Ophthalmology;  Laterality: Bilateral;   LACRIMAL TUBE REMOVAL Bilateral 05/03/2019   Procedure: BILATERAL NASOLACRIMAL DUCT PROBING, IIRIGATION AND TUBE REMOVAL BOTH EYES;  Surgeon: Aura Camps, MD;  Location: [facility];  Service: Ophthalmology;  Laterality: Bilateral;   LUMBAR LAMINECTOMY/DECOMPRESSION MICRODISCECTOMY N/A 06/03/2015   Procedure: Bilateral lateral recess decompression L2-3, L3-4, L4-5;  Surgeon: Kerrin Champagne, MD;  Location: [facility];  Service: Orthopedics;  Laterality: N/A;   LUMBAR LAMINECTOMY/DECOMPRESSION MICRODISCECTOMY N/A 10/02/2016   Procedure: Right L5-S1 Lateral Recess Decompression  microdiscectomy;  Surgeon: Kerrin Champagne, MD;  [facility];  Service: Orthopedics;  Laterality: N/A;   LUMBAR LAMINECTOMY/DECOMPRESSION MICRODISCECTOMY N/A 07/15/2018   Procedure: LEFT L3-4 MICRODISCECTOMY;  Surgeon: Kerrin Champagne, MD;  Location: [facility] OR;  Service: Orthopedics;  Laterality: N/A;   svd      x 2   TEAR DUCT PROBING Bilateral 03/01/2019   Procedure: TEAR DUCT PROBING  WITH IRRIGATION;  Surgeon: Aura Camps, MD;  Location: [facility];  Service: Ophthalmology;  Laterality: Bilateral;   TOTAL HIP ARTHROPLASTY Left 03/14/2019   Procedure: LEFT TOTAL HIP ARTHROPLASTY ANTERIOR APPROACH;  Surgeon: [name], MD;  Location: [facility];  Service: Orthopedics;  Laterality: Left;   TOTAL KNEE ARTHROPLASTY Right 09/06/2015   Procedure: RIGHT TOTAL KNEE ARTHROPLASTY;  Surgeon: Kerrin Champagne, MD;  Location: [facility];  Service: Orthopedics;  Laterality: Right;   TOTAL KNEE REVISION Right 05/12/2022   Procedure: RIGHT TOTAL KNEE REVISION ARTHROPLASTY;  Surgeon: [name], MD;  Location: [facility];  Service: Orthopedics;  Laterality: Right;   TUBAL LIGATION     UPPER GASTROINTESTINAL ENDOSCOPY  04/28/2011   Patient Active Problem List   Diagnosis Date Noted   Status post revision of total replacement of right knee 05/12/2022   Personal history of colonic polyps 03/20/2022   Failed total knee, right, subsequent encounter 03/02/2022   Loose right total knee arthroplasty (HCC) 03/02/2022   Mixed stress and urge urinary incontinence 12/04/2021   Functional fecal incontinence 12/04/2021   IBS (irritable bowel syndrome) 11/19/2021   Spondylolisthesis, lumbar region    Other spondylosis with radiculopathy, lumbar region    Other secondary scoliosis, lumbar region    Fusion of spine of lumbar region 03/25/2021   Colon polyps 06/06/2020   Lupus (HCC) 06/06/2020   Allergic rhinitis due to animal (cat) (dog) hair and dander 04/08/2020   Allergic rhinitis due to pollen 04/08/2020   Food allergy 04/08/2020   Acute medial meniscus tear, left, subsequent encounter 03/21/2020   Chronic pain of left knee 02/15/2020   Paresthesia of skin 11/16/2019   History of total knee replacement, right 11/16/2019   Tobacco abuse 11/16/2019   Centrilobular emphysema (HCC) 08/10/2019   Incidental lung nodule, > 3mm and < 8mm 08/10/2019   OSA on CPAP 08/10/2019   Dyspnea on exertion 08/02/2019   Hyperlipidemia 08/02/2019   Lumbar radiculopathy 04/24/2019   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   Post laminectomy syndrome 02/23/2019   Abnormality of gait 02/23/2019   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   Unilateral primary osteoarthritis, left hip 12/28/2018   Lesion of skin of left ear 12/26/2018   Primary osteoarthritis of left hip 12/02/2018   Iron deficiency anemia 10/16/2018   Chronic pain syndrome 09/08/2018   Chronic pain of right knee 08/11/2018   Status post lumbar laminectomy 07/15/2018   Peripheral arterial disease (HCC) 04/05/2018   Moderate  persistent asthma without complication 06/29/2017   Environmental and seasonal allergies 06/29/2017   Controlled type 2 diabetes mellitus with diabetic polyneuropathy, without long-term current use of insulin (HCC) 06/29/2017   Perennial allergic rhinitis 04/08/2017   Sensorineural hearing loss (SNHL), bilateral 04/08/2017   Chronic pansinusitis 03/25/2017   Eustachian tube dysfunction, bilateral 03/25/2017   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   Herniation of lumbar intervertebral disc with radiculopathy 10/02/2016    Class: Chronic   Alopecia areata 08/19/2016   Chondromalacia of both patellae 06/03/2015    Class: Chronic   Spinal stenosis, lumbar region, with neurogenic claudication 06/03/2015   Tobacco use disorder 04/25/2015   DJD (degenerative joint disease) of knee 01/04/2015   Hemorrhoid 11/14/2014   Gout of big toe 07/19/2014   Essential hypertension 08/14/2013   Gastroesophageal reflux disease without esophagitis 08/14/2013   COPD (chronic obstructive pulmonary disease) (HCC) 04/17/2011    PCP: Jonah Blue  REFERRING PROVIDER: Allie Bossier  REFERRING DIAG: [phone] (ICD-10-CM) -  Status post total right knee replacement   THERAPY DIAG:  Acute pain of right knee  Stiffness of right knee, not elsewhere classified  Muscle weakness (generalized)  Difficulty in walking, not elsewhere classified  Rationale for Evaluation and Treatment: Rehabilitation  ONSET DATE: 05/12/22  SUBJECTIVE:   SUBJECTIVE STATEMENT: "I feel good today, doctor tole me I was doing real good"   PERTINENT HISTORY: L hip replacement, L knee scope, R knee replacement and reduction now revision  PAIN:  Are you having pain? Yes: NPRS scale: 0/10 Pain location: R knee Pain description: throbbing, aching  Aggravating factors: standing or walking too long Relieving factors: ice, pain meds   PRECAUTIONS: None  WEIGHT BEARING RESTRICTIONS: No  FALLS:  Has patient fallen in last 6 months? Yes.  Number of falls 5, knees buckle on me   LIVING ENVIRONMENT: Lives with: lives with their spouse Lives in: House/apartment Stairs: Yes: Internal: 12 steps; on left going up Has following equipment at home: Single point cane, Walker - 2 wheeled, and Environmental consultant - 4 wheeled  OCCUPATION: Retired  PLOF: Independent with basic ADLs and Independent with household mobility with device  PATIENT GOALS: getting better and be able to move around better   NEXT MD VISIT: 06/24/22  OBJECTIVE:   DIAGNOSTIC FINDINGS: FINDINGS: Postsurgical changes of right knee arthroplasty revision with constrained long-stem arthroplasty. No evidence of loosening or fracture. Expected soft tissue changes.   IMPRESSION: Postsurgical changes of right knee arthroplasty revision. No evidence of immediate hardware complication  PATIENT SURVEYS:  FOTO 51  COGNITION: Overall cognitive status: Within functional limits for tasks assessed     SENSATION: WFL   MUSCLE LENGTH: Hamstrings: bilateral tightness  POSTURE: rounded shoulders   LOWER EXTREMITY ROM:  Active ROM Right eval Left eval  Hip flexion    Hip extension    Hip abduction    Hip adduction    Hip internal rotation    Hip external rotation    Knee flexion 95   Knee extension -18   Ankle dorsiflexion    Ankle plantarflexion    Ankle inversion    Ankle eversion     (Blank rows = not tested)  LOWER EXTREMITY MMT:  MMT Right eval Left eval  Hip flexion 3+   Hip extension    Hip abduction    Hip adduction    Hip internal rotation    Hip external rotation    Knee flexion 3+   Knee extension 3+    Ankle dorsiflexion    Ankle plantarflexion    Ankle inversion    Ankle eversion     (Blank rows = not tested)   FUNCTIONAL TESTS:  5 times sit to stand: 17.21s Timed up and go (TUG): 15.82s  GAIT: Distance walked: in clinic distances Assistive device utilized: Environmental consultant - 4 wheeled and None Level of assistance: Complete Independence and  Modified independence Comments: able to complete TUG without AD, uses rollator on a regular basis    TODAY'S TREATMENT:  DATE:  06/25/22 NuStep L5 x 6 min R knee PROM with end range holds, patella mobs S2S 2x10 holding yellow wball  Resisted gait 30lb all directions x4 each, LOB x 1 Max assit to recover  4in steps up x 10 each, 6in step ups x 10 each  HS curls 25lb 2x10   06/22/22 R knee PROM with end range holds, patella mobs LAQ RLE 3lb 3x10 HS green 2x15 NuStep L 5 x 6 min  Forward & Lateral step ups 4in box 2x10 Heel raise black bar 2x12 Bike L 1 x 3 min  S2S 2x10  06/18/22 NuStep L 4 x 6 min R knee PROM with end range holds, patella mobs LAQ RLE 2lb 2x10 HS curle RLE green 2x12 S2S 2x12 Hip abd ball squeeze 2x12 Standing march 2x10    EVAL 06/16/22    PATIENT EDUCATION:  Education details: HEP and POC Person educated: Patient Education method: Explanation Education comprehension: verbalized understanding  HOME EXERCISE PROGRAM: Access Code: [email or web address] URL: [email or web address] Date: 06/16/2022 Prepared by: Cassie Freer  Exercises - Mini Squat with Counter Support  - 1 x daily - 7 x weekly - 3 sets - 10 reps - Standing Hip Abduction with Unilateral Counter Support  - 1 x daily - 7 x weekly - 3 sets - 10 reps - Standing March with Unilateral Counter Support  - 1 x daily - 7 x weekly - 3 sets - 10 reps - Heel Raises with Counter Support  - 1 x daily - 7 x weekly - 3 sets - 10 reps - Sit to Stand  - 1 x daily - 7 x weekly - 3 sets - 10 reps - Seated Hamstring Stretch  - 1 x daily - 7 x weekly - 2 reps - 30 hold  ASSESSMENT:  CLINICAL IMPRESSION: Patient is a 62 y.o. female who was seen today for physical therapy treatment for R total knee revision. Little pain reported with PROM. Pt enters with reports of doing well with no  changes. Pt able to tolerated resistance with sit to stands requiring cues to lean more to her R side. Progressed too resisted gait pt did well with forward abd backwards. LOB x 1 with resisted side step during the eccentric phase doing to her L side. L knee buckled and pt fell into therapist sliding down to the floor. No injuries sustained. SZP completed. Pt then stated that her L knee does give out on her two wo three times today. Pt then reported hr last fall was yesterday morning. She was edgar to continue therapy. Step ups and machine level interventions completed without issue.   OBJECTIVE IMPAIRMENTS: Abnormal gait, decreased mobility, difficulty walking, decreased ROM, decreased strength, and pain.   REHAB POTENTIAL: Good  CLINICAL DECISION MAKING: Stable/uncomplicated  EVALUATION COMPLEXITY: Low   GOALS: Goals reviewed with patient? Yes  SHORT TERM GOALS: Target date: 07/28/22  Patient will be independent with initial HEP. Goal status: INITIAL  2.  Patient will demonstrate 5xSTS in < 13s  Baseline: 17.21s Goal status: INITIAL    LONG TERM GOALS: Target date: 09/01/22  Patient will be independent with advanced/ongoing HEP to improve outcomes and carryover.  Goal status: INITIAL  2.  Patient will report at least 75% improvement in R knee pain to improve QOL. Baseline: 7/10 at worst Goal status: INITIAL  3.  Patient will demonstrate improved R knee AROM to >/= 0-120 deg to allow for normal gait and stair mechanics. Baseline: 18-0-95 Goal status: INITIAL  4.  Patient will demonstrate improved functional LE strength as demonstrated by 5/5. Baseline: 3+ Goal status: INITIAL  5.  Patient will be able to ambulate 600' with LRAD and normal gait pattern without increased pain to access community.  Baseline: using rollator walking Goal status: INITIAL  6. Patient will be able to do TUG <12s.  Baseline: 15.82s Goal status: INITIAL  7.  Patient will report 71 on FOTO  (patient reported outcome measure) to demonstrate improved functional ability. Baseline: 51 Goal status: INITIAL   PLAN:  PT FREQUENCY: 2x/week  PT DURATION: 10 weeks  PLANNED INTERVENTIONS: Therapeutic exercises, Therapeutic activity, Neuromuscular re-education, Balance training, Gait training, Patient/Family education, Self Care, Joint mobilization, Stair training, Dry Needling, Cryotherapy, Moist heat, Vasopneumatic device, Ionotophoresis 4mg /ml Dexamethasone, and Manual therapy  PLAN FOR NEXT SESSION: start gym activities for R knee, work on ROM and strengthening    Grayce Sessions, PTA 06/25/2022, 7:54 AM

## 2022-06-30 ENCOUNTER — Ambulatory Visit: Payer: Medicaid Other | Admitting: Physical Therapy

## 2022-06-30 ENCOUNTER — Encounter: Payer: Self-pay | Admitting: Physical Therapy

## 2022-06-30 DIAGNOSIS — M25561 Pain in right knee: Secondary | ICD-10-CM | POA: Diagnosis not present

## 2022-06-30 DIAGNOSIS — R262 Difficulty in walking, not elsewhere classified: Secondary | ICD-10-CM

## 2022-06-30 DIAGNOSIS — M6281 Muscle weakness (generalized): Secondary | ICD-10-CM

## 2022-06-30 DIAGNOSIS — M25661 Stiffness of right knee, not elsewhere classified: Secondary | ICD-10-CM

## 2022-06-30 DIAGNOSIS — R296 Repeated falls: Secondary | ICD-10-CM

## 2022-06-30 NOTE — Therapy (Signed)
OUTPATIENT PHYSICAL THERAPY LOWER EXTREMITY TREATMENT   Patient Name: Stacy [name] [facility] MRN: [id number] DOB:Oct 11, 1960, 62 y.o., female Today's Date: 06/30/2022  END OF SESSION:  PT End of Session - 06/30/22 0756     Visit Number 5    Date for PT Re-Evaluation 09/01/22    PT Start Time 0753    PT Stop Time 0834    PT Time Calculation (min) 41 min    Activity Tolerance Patient tolerated treatment well    Behavior During Therapy [facility] for tasks assessed/performed             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   Asthma    COPD (chronic obstructive pulmonary disease) (HCC)    Depression    Diabetes mellitus    Type II   GERD (gastroesophageal reflux disease)    Pt on Protonix daily   Glaucoma    Gout    Headache(784.0)    otc meds prn   Hyperlipidemia    Hypertension    Irritable bowel syndrome 11/19/2010   Neuropathy    Pneumonia YRS AGO   Restless legs    Shortness of breath    07/14/2018- uses  4 times a day   Sleep apnea    Past Surgical History:  Procedure Laterality Date   BACK SURGERY     feb 21, 23   CHOLECYSTECTOMY     COLONOSCOPY     ENDOMETRIAL ABLATION  10/2010   HERNIA REPAIR     umbicial hernia   JOINT REPLACEMENT Left 03/14/2019   Dr. Magnus Ivan hip   KNEE ARTHROSCOPY Left    06/07/2017 Dr. August [facility]   KNEE ARTHROSCOPY Left 03/21/2020   Procedure: LEFT KNEE ARTHROSCOPY WITH PARTIAL MEDIAL MENISCECTOMY;  Surgeon: [name], MD;  Location: [facility];  Service: Orthopedics;  Laterality: Left;   KNEE CLOSED REDUCTION Right 12/06/2015   Procedure: CLOSED MANIPULATION RIGHT KNEE;  Surgeon: Kerrin Champagne, MD;  Location: [facility] OR;  Service: Orthopedics;  Laterality: Right;   KNEE CLOSED REDUCTION Right 01/17/2016   Procedure: CLOSED MANIPULATION RIGHT KNEE;  Surgeon: Kerrin Champagne,  MD;  Location: [facility] OR;  Service: Orthopedics;  Laterality: Right;   KNEE JOINT MANIPULATION Right 12/06/2015   LACRIMAL TUBE INSERTION Bilateral 03/01/2019   Procedure: LACRIMAL TUBE INSERTION;  Surgeon: Aura Camps, MD;  Location: [facility];  Service: Ophthalmology;  Laterality: Bilateral;   LACRIMAL TUBE REMOVAL Bilateral 05/03/2019   Procedure: BILATERAL NASOLACRIMAL DUCT PROBING, IIRIGATION AND TUBE REMOVAL BOTH EYES;  Surgeon: Aura Camps, MD;  Location: [facility];  Service: Ophthalmology;  Laterality: Bilateral;   LUMBAR LAMINECTOMY/DECOMPRESSION MICRODISCECTOMY N/A 06/03/2015   Procedure: Bilateral lateral recess decompression L2-3, L3-4, L4-5;  Surgeon: Kerrin Champagne, MD;  Location: [facility];  Service: Orthopedics;  Laterality: N/A;   LUMBAR LAMINECTOMY/DECOMPRESSION MICRODISCECTOMY N/A 10/02/2016   Procedure: Right L5-S1 Lateral Recess Decompression  microdiscectomy;  Surgeon: Kerrin Champagne, MD;  Location: [facility] OR;  Service: Orthopedics;  Laterality: N/A;   LUMBAR LAMINECTOMY/DECOMPRESSION MICRODISCECTOMY N/A 07/15/2018   Procedure: LEFT L3-4 MICRODISCECTOMY;  Surgeon: Kerrin Champagne, MD;  Location: [facility] OR;  Service: Orthopedics;  Laterality: N/A;   svd      x 2   TEAR DUCT PROBING Bilateral 03/01/2019   Procedure: TEAR DUCT PROBING  WITH IRRIGATION;  Surgeon: Aura Camps, MD;  Location: [facility];  Service: Ophthalmology;  Laterality: Bilateral;   TOTAL HIP ARTHROPLASTY Left 03/14/2019   Procedure: LEFT TOTAL HIP ARTHROPLASTY ANTERIOR APPROACH;  Surgeon: [name], MD;  Location: [facility];  Service: Orthopedics;  Laterality: Left;   TOTAL KNEE ARTHROPLASTY Right 09/06/2015   Procedure: RIGHT TOTAL KNEE ARTHROPLASTY;  Surgeon: Kerrin Champagne, MD;  Location: [facility];  Service: Orthopedics;  Laterality: Right;   TOTAL KNEE REVISION Right 05/12/2022   Procedure: RIGHT TOTAL KNEE REVISION ARTHROPLASTY;  Surgeon: [name], MD;  Location: [facility];  Service: Orthopedics;  Laterality: Right;   TUBAL LIGATION     UPPER GASTROINTESTINAL ENDOSCOPY  04/28/2011   Patient Active Problem List   Diagnosis Date Noted   Status post revision of total replacement of right knee 05/12/2022   Personal history of colonic polyps 03/20/2022   Failed total knee, right, subsequent encounter 03/02/2022   Loose right total knee arthroplasty (HCC) 03/02/2022   Mixed stress and urge urinary incontinence 12/04/2021   Functional fecal incontinence 12/04/2021   IBS (irritable bowel syndrome) 11/19/2021   Spondylolisthesis, lumbar region    Other spondylosis with radiculopathy, lumbar region    Other secondary scoliosis, lumbar region    Fusion of spine of lumbar region 03/25/2021   Colon polyps 06/06/2020   Lupus (HCC) 06/06/2020   Allergic rhinitis due to animal (cat) (dog) hair and dander 04/08/2020   Allergic rhinitis due to pollen 04/08/2020   Food allergy 04/08/2020   Acute medial meniscus tear, left, subsequent encounter 03/21/2020   Chronic pain of left knee 02/15/2020   Paresthesia of skin 11/16/2019   History of total knee replacement, right 11/16/2019   Tobacco abuse 11/16/2019   Centrilobular emphysema (HCC) 08/10/2019   Incidental lung nodule, > 3mm and < 8mm 08/10/2019   OSA on CPAP 08/10/2019   Dyspnea on exertion 08/02/2019   Hyperlipidemia 08/02/2019   Lumbar radiculopathy 04/24/2019   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   Post laminectomy syndrome 02/23/2019   Abnormality of gait 02/23/2019   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   Unilateral primary osteoarthritis, left hip 12/28/2018   Lesion of skin of left ear 12/26/2018   Primary osteoarthritis of left hip 12/02/2018   Iron deficiency anemia 10/16/2018   Chronic pain syndrome 09/08/2018   Chronic pain of right knee 08/11/2018   Status post lumbar laminectomy 07/15/2018   Peripheral arterial disease (HCC) 04/05/2018   Moderate  persistent asthma without complication 06/29/2017   Environmental and seasonal allergies 06/29/2017   Controlled type 2 diabetes mellitus with diabetic polyneuropathy, without long-term current use of insulin (HCC) 06/29/2017   Perennial allergic rhinitis 04/08/2017   Sensorineural hearing loss (SNHL), bilateral 04/08/2017   Chronic pansinusitis 03/25/2017   Eustachian tube dysfunction, bilateral 03/25/2017   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   Herniation of lumbar intervertebral disc with radiculopathy 10/02/2016    Class: Chronic   Alopecia areata 08/19/2016   Chondromalacia of both patellae 06/03/2015    Class: Chronic   Spinal stenosis, lumbar region, with neurogenic claudication 06/03/2015   Tobacco use disorder 04/25/2015   DJD (degenerative joint disease) of knee 01/04/2015   Hemorrhoid 11/14/2014   Gout of big toe 07/19/2014   Essential hypertension 08/14/2013   Gastroesophageal reflux disease without esophagitis 08/14/2013   COPD (chronic obstructive pulmonary disease) (HCC) 04/17/2011    PCP: Jonah Blue  REFERRING PROVIDER: Allie Bossier  REFERRING DIAG: [id number] (ICD-10-CM) -  Status post total right knee replacement   THERAPY DIAG:  Acute pain of right knee  Muscle weakness (generalized)  Stiffness of right knee, not elsewhere classified  Difficulty in walking, not elsewhere classified  Repeated falls  Rationale for Evaluation and Treatment: Rehabilitation  ONSET DATE: 05/12/22  SUBJECTIVE:   SUBJECTIVE STATEMENT: "I feel good no pain" Pt reports a fall Saturday, after going for a walk. She was using her rolator   PERTINENT HISTORY: L hip replacement, L knee scope, R knee replacement and reduction now revision  PAIN:  Are you having pain? Yes: NPRS scale: 0/10 Pain location: R knee Pain description: throbbing, aching  Aggravating factors: standing or walking too long Relieving factors: ice, pain meds   PRECAUTIONS: None  WEIGHT BEARING  RESTRICTIONS: No  FALLS:  Has patient fallen in last 6 months? Yes. Number of falls 5, knees buckle on me   LIVING ENVIRONMENT: Lives with: lives with their spouse Lives in: House/apartment Stairs: Yes: Internal: 12 steps; on left going up Has following equipment at home: Single point cane, Walker - 2 wheeled, and Environmental consultant - 4 wheeled  OCCUPATION: Retired  PLOF: Independent with basic ADLs and Independent with household mobility with device  PATIENT GOALS: getting better and be able to move around better   NEXT MD VISIT: 06/24/22  OBJECTIVE:   DIAGNOSTIC FINDINGS: FINDINGS: Postsurgical changes of right knee arthroplasty revision with constrained long-stem arthroplasty. No evidence of loosening or fracture. Expected soft tissue changes.   IMPRESSION: Postsurgical changes of right knee arthroplasty revision. No evidence of immediate hardware complication  PATIENT SURVEYS:  FOTO 51  COGNITION: Overall cognitive status: Within functional limits for tasks assessed     SENSATION: WFL   MUSCLE LENGTH: Hamstrings: bilateral tightness  POSTURE: rounded shoulders   LOWER EXTREMITY ROM:  Active ROM Right eval Right 06/30/22  Hip flexion    Hip extension    Hip abduction    Hip adduction    Hip internal rotation    Hip external rotation    Knee flexion 95 105  Knee extension -18 11  Ankle dorsiflexion    Ankle plantarflexion    Ankle inversion    Ankle eversion     (Blank rows = not tested)  LOWER EXTREMITY MMT:  MMT Right eval Left eval  Hip flexion 3+   Hip extension    Hip abduction    Hip adduction    Hip internal rotation    Hip external rotation    Knee flexion 3+   Knee extension 3+    Ankle dorsiflexion    Ankle plantarflexion    Ankle inversion    Ankle eversion     (Blank rows = not tested)   FUNCTIONAL TESTS:  5 times sit to stand: 17.21s Timed up and go (TUG): 15.82s  GAIT: Distance walked: in clinic distances Assistive device  utilized: Environmental consultant - 4 wheeled and None Level of assistance: Complete Independence and Modified independence Comments: able to complete TUG without AD, uses rollator on a regular basis    TODAY'S TREATMENT:  DATE:  06/30/22 NuStep L 5 x 6 min R knee PROM with end range holds, patella mobs S2S 2x10 holding yellow ball  Standing march 2x10 Hamstring Curls 25lb 2x12 Leg Ext 10lb 2x12, RLE 5lb 2x10  6in step ups x10 Lateral 4in step ups  Heel raises 2x15  06/25/22 NuStep L5 x 6 min R knee PROM with end range holds, patella mobs S2S 2x10 holding yellow wball  Resisted gait 30lb all directions x4 each, LOB x 1 Max assit to recover  4in steps up x 10 each, 6in step ups x 10 each  HS curls 25lb 2x10   06/22/22 R knee PROM with end range holds, patella mobs LAQ RLE 3lb 3x10 HS green 2x15 NuStep L 5 x 6 min  Forward & Lateral step ups 4in box 2x10 Heel raise black bar 2x12 Bike L 1 x 3 min  S2S 2x10  06/18/22 NuStep L 4 x 6 min R knee PROM with end range holds, patella mobs LAQ RLE 2lb 2x10 HS curle RLE green 2x12 S2S 2x12 Hip abd ball squeeze 2x12 Standing march 2x10    EVAL 06/16/22    PATIENT EDUCATION:  Education details: HEP and POC Person educated: Patient Education method: Explanation Education comprehension: verbalized understanding  HOME EXERCISE PROGRAM: Access Code: [email or web address] URL: [email or web address] Date: 06/16/2022 Prepared by: Cassie Freer  Exercises - Mini Squat with Counter Support  - 1 x daily - 7 x weekly - 3 sets - 10 reps - Standing Hip Abduction with Unilateral Counter Support  - 1 x daily - 7 x weekly - 3 sets - 10 reps - Standing March with Unilateral Counter Support  - 1 x daily - 7 x weekly - 3 sets - 10 reps - Heel Raises with Counter Support  - 1 x daily - 7 x weekly - 3 sets - 10 reps - Sit to Stand  - 1  x daily - 7 x weekly - 3 sets - 10 reps - Seated Hamstring Stretch  - 1 x daily - 7 x weekly - 2 reps - 30 hold  ASSESSMENT:  CLINICAL IMPRESSION: Patient is a 62 y.o. female who was seen today for physical therapy treatment for R total knee revision. Little pain reported with PROM. Pt enters with reports of doing well with no changes. Continues with LE strengthening with functional and machine level interventions. Cue for LE placement needed with sit to stands to prevent compensation. She as progressed increasing her R knee AROM in both directions, but extension remains limited. Decrease RLE TKE with step ups.     OBJECTIVE IMPAIRMENTS: Abnormal gait, decreased mobility, difficulty walking, decreased ROM, decreased strength, and pain.   REHAB POTENTIAL: Good  CLINICAL DECISION MAKING: Stable/uncomplicated  EVALUATION COMPLEXITY: Low   GOALS: Goals reviewed with patient? Yes  SHORT TERM GOALS: Target date: 07/28/22  Patient will be independent with initial HEP. Goal status: Progressing 06/30/22  2.  Patient will demonstrate 5xSTS in < 13s  Baseline: 17.21s Goal status: INITIAL    LONG TERM GOALS: Target date: 09/01/22  Patient will be independent with advanced/ongoing HEP to improve outcomes and carryover.  Goal status: INITIAL  2.  Patient will report at least 75% improvement in R knee pain to improve QOL. Baseline: 7/10 at worst Goal status: INITIAL  3.  Patient will demonstrate improved R knee AROM to >/= 0-120 deg to allow for normal gait and stair mechanics. Baseline: 18-0-95 Goal status: INITIAL  4.  Patient will demonstrate improved functional  LE strength as demonstrated by 5/5. Baseline: 3+ Goal status: INITIAL  5.  Patient will be able to ambulate 600' with LRAD and normal gait pattern without increased pain to access community.  Baseline: using rollator walking Goal status: INITIAL  6. Patient will be able to do TUG <12s.  Baseline: 15.82s Goal status:  INITIAL  7.  Patient will report 7 on FOTO (patient reported outcome measure) to demonstrate improved functional ability. Baseline: 51 Goal status: INITIAL   PLAN:  PT FREQUENCY: 2x/week  PT DURATION: 10 weeks  PLANNED INTERVENTIONS: Therapeutic exercises, Therapeutic activity, Neuromuscular re-education, Balance training, Gait training, Patient/Family education, Self Care, Joint mobilization, Stair training, Dry Needling, Cryotherapy, Moist heat, Vasopneumatic device, Ionotophoresis 4mg /ml Dexamethasone, and Manual therapy  PLAN FOR NEXT SESSION: start gym activities for R knee, work on ROM and strengthening    Grayce Sessions, PTA 06/30/2022, 7:57 AM

## 2022-07-01 DIAGNOSIS — F331 Major depressive disorder, recurrent, moderate: Secondary | ICD-10-CM | POA: Diagnosis not present

## 2022-07-02 ENCOUNTER — Encounter: Payer: Self-pay | Admitting: Physical Therapy

## 2022-07-02 ENCOUNTER — Ambulatory Visit: Payer: Medicaid Other | Admitting: Physical Therapy

## 2022-07-02 DIAGNOSIS — R296 Repeated falls: Secondary | ICD-10-CM

## 2022-07-02 DIAGNOSIS — R262 Difficulty in walking, not elsewhere classified: Secondary | ICD-10-CM

## 2022-07-02 DIAGNOSIS — M25561 Pain in right knee: Secondary | ICD-10-CM

## 2022-07-02 DIAGNOSIS — M6281 Muscle weakness (generalized): Secondary | ICD-10-CM

## 2022-07-02 DIAGNOSIS — M25661 Stiffness of right knee, not elsewhere classified: Secondary | ICD-10-CM

## 2022-07-02 NOTE — Therapy (Signed)
OUTPATIENT PHYSICAL THERAPY LOWER EXTREMITY TREATMENT   Patient Name: Stacy [name] [facility] MRN: [id number] DOB:31-Jan-1961, 62 y.o., female Today's Date: 07/02/2022  END OF SESSION:  PT End of Session - 07/02/22 0755     Visit Number 6    Date for PT Re-Evaluation 09/01/22    PT Start Time 0755    PT Stop Time 0840    PT Time Calculation (min) 45 min    Activity Tolerance Patient tolerated treatment well    Behavior During Therapy [facility] for tasks assessed/performed             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   Asthma    COPD (chronic obstructive pulmonary disease) (HCC)    Depression    Diabetes mellitus    Type II   GERD (gastroesophageal reflux disease)    Pt on Protonix daily   Glaucoma    Gout    Headache(784.0)    otc meds prn   Hyperlipidemia    Hypertension    Irritable bowel syndrome 11/19/2010   Neuropathy    Pneumonia YRS AGO   Restless legs    Shortness of breath    07/14/2018- uses  4 times a day   Sleep apnea    Past Surgical History:  Procedure Laterality Date   BACK SURGERY     feb 21, 23   CHOLECYSTECTOMY     COLONOSCOPY     ENDOMETRIAL ABLATION  10/2010   HERNIA REPAIR     umbicial hernia   JOINT REPLACEMENT Left 03/14/2019   Dr. Magnus Ivan hip   KNEE ARTHROSCOPY Left    06/07/2017 Dr. August [facility]   KNEE ARTHROSCOPY Left 03/21/2020   Procedure: LEFT KNEE ARTHROSCOPY WITH PARTIAL MEDIAL MENISCECTOMY;  Surgeon: [name], MD;  Location: [facility];  Service: Orthopedics;  Laterality: Left;   KNEE CLOSED REDUCTION Right 12/06/2015   Procedure: CLOSED MANIPULATION RIGHT KNEE;  Surgeon: Kerrin Champagne, MD;  Location: [facility];  Service: Orthopedics;  Laterality: Right;   KNEE CLOSED REDUCTION Right 01/17/2016   Procedure: CLOSED MANIPULATION RIGHT KNEE;  Surgeon: Kerrin Champagne,  MD;  Location: [facility];  Service: Orthopedics;  Laterality: Right;   KNEE JOINT MANIPULATION Right 12/06/2015   LACRIMAL TUBE INSERTION Bilateral 03/01/2019   Procedure: LACRIMAL TUBE INSERTION;  Surgeon: Aura Camps, MD;  Location: [facility];  Service: Ophthalmology;  Laterality: Bilateral;   LACRIMAL TUBE REMOVAL Bilateral 05/03/2019   Procedure: BILATERAL NASOLACRIMAL DUCT PROBING, IIRIGATION AND TUBE REMOVAL BOTH EYES;  Surgeon: Aura Camps, MD;  Location: [facility];  Service: Ophthalmology;  Laterality: Bilateral;   LUMBAR LAMINECTOMY/DECOMPRESSION MICRODISCECTOMY N/A 06/03/2015   Procedure: Bilateral lateral recess decompression L2-3, L3-4, L4-5;  Surgeon: Kerrin Champagne, MD;  Location: [facility];  Service: Orthopedics;  Laterality: N/A;   LUMBAR LAMINECTOMY/DECOMPRESSION MICRODISCECTOMY N/A 10/02/2016   Procedure: Right L5-S1 Lateral Recess Decompression  microdiscectomy;  Surgeon: Kerrin Champagne, MD;  Location: [facility] OR;  Service: Orthopedics;  Laterality: N/A;   LUMBAR LAMINECTOMY/DECOMPRESSION MICRODISCECTOMY N/A 07/15/2018   Procedure: LEFT L3-4 MICRODISCECTOMY;  Surgeon: Kerrin Champagne, MD;  Location: [facility] OR;  Service: Orthopedics;  Laterality: N/A;   svd      x 2   TEAR DUCT PROBING Bilateral 03/01/2019   Procedure: TEAR DUCT PROBING  WITH IRRIGATION;  Surgeon: Aura Camps, MD;  Location: [facility];  Service: Ophthalmology;  Laterality: Bilateral;   TOTAL HIP ARTHROPLASTY Left 03/14/2019   Procedure: LEFT TOTAL HIP ARTHROPLASTY ANTERIOR APPROACH;  Surgeon: [name], MD;  Location: [facility];  Service: Orthopedics;  Laterality: Left;   TOTAL KNEE ARTHROPLASTY Right 09/06/2015   Procedure: RIGHT TOTAL KNEE ARTHROPLASTY;  Surgeon: Kerrin Champagne, MD;  Location: [facility];  Service: Orthopedics;  Laterality: Right;   TOTAL KNEE REVISION Right 05/12/2022   Procedure: RIGHT TOTAL KNEE REVISION ARTHROPLASTY;  Surgeon: [name], MD;  Location: [facility];  Service: Orthopedics;  Laterality: Right;   TUBAL LIGATION     UPPER GASTROINTESTINAL ENDOSCOPY  04/28/2011   Patient Active Problem List   Diagnosis Date Noted   Status post revision of total replacement of right knee 05/12/2022   Personal history of colonic polyps 03/20/2022   Failed total knee, right, subsequent encounter 03/02/2022   Loose right total knee arthroplasty (HCC) 03/02/2022   Mixed stress and urge urinary incontinence 12/04/2021   Functional fecal incontinence 12/04/2021   IBS (irritable bowel syndrome) 11/19/2021   Spondylolisthesis, lumbar region    Other spondylosis with radiculopathy, lumbar region    Other secondary scoliosis, lumbar region    Fusion of spine of lumbar region 03/25/2021   Colon polyps 06/06/2020   Lupus (HCC) 06/06/2020   Allergic rhinitis due to animal (cat) (dog) hair and dander 04/08/2020   Allergic rhinitis due to pollen 04/08/2020   Food allergy 04/08/2020   Acute medial meniscus tear, left, subsequent encounter 03/21/2020   Chronic pain of left knee 02/15/2020   Paresthesia of skin 11/16/2019   History of total knee replacement, right 11/16/2019   Tobacco abuse 11/16/2019   Centrilobular emphysema (HCC) 08/10/2019   Incidental lung nodule, > 3mm and < 8mm 08/10/2019   OSA on CPAP 08/10/2019   Dyspnea on exertion 08/02/2019   Hyperlipidemia 08/02/2019   Lumbar radiculopathy 04/24/2019   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   Post laminectomy syndrome 02/23/2019   Abnormality of gait 02/23/2019   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   Unilateral primary osteoarthritis, left hip 12/28/2018   Lesion of skin of left ear 12/26/2018   Primary osteoarthritis of left hip 12/02/2018   Iron deficiency anemia 10/16/2018   Chronic pain syndrome 09/08/2018   Chronic pain of right knee 08/11/2018   Status post lumbar laminectomy 07/15/2018   Peripheral arterial disease (HCC) 04/05/2018   Moderate  persistent asthma without complication 06/29/2017   Environmental and seasonal allergies 06/29/2017   Controlled type 2 diabetes mellitus with diabetic polyneuropathy, without long-term current use of insulin (HCC) 06/29/2017   Perennial allergic rhinitis 04/08/2017   Sensorineural hearing loss (SNHL), bilateral 04/08/2017   Chronic pansinusitis 03/25/2017   Eustachian tube dysfunction, bilateral 03/25/2017   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   Herniation of lumbar intervertebral disc with radiculopathy 10/02/2016    Class: Chronic   Alopecia areata 08/19/2016   Chondromalacia of both patellae 06/03/2015    Class: Chronic   Spinal stenosis, lumbar region, with neurogenic claudication 06/03/2015   Tobacco use disorder 04/25/2015   DJD (degenerative joint disease) of knee 01/04/2015   Hemorrhoid 11/14/2014   Gout of big toe 07/19/2014   Essential hypertension 08/14/2013   Gastroesophageal reflux disease without esophagitis 08/14/2013   COPD (chronic obstructive pulmonary disease) (HCC) 04/17/2011    PCP: Jonah Blue  REFERRING PROVIDER: Allie Bossier  REFERRING DIAG: [id number] (ICD-10-CM) -  Status post total right knee replacement   THERAPY DIAG:  Acute pain of right knee  Muscle weakness (generalized)  Stiffness of right knee, not elsewhere classified  Difficulty in walking, not elsewhere classified  Repeated falls  Rationale for Evaluation and Treatment: Rehabilitation  ONSET DATE: 05/12/22  SUBJECTIVE:   SUBJECTIVE STATEMENT: Doing good  PERTINENT HISTORY: L hip replacement, L knee scope, R knee replacement and reduction now revision  PAIN:  Are you having pain? Yes: NPRS scale: 0/10 Pain location: R knee Pain description: throbbing, aching  Aggravating factors: standing or walking too long Relieving factors: ice, pain meds   PRECAUTIONS: None  WEIGHT BEARING RESTRICTIONS: No  FALLS:  Has patient fallen in last 6 months? Yes. Number of falls 5, knees  buckle on me   LIVING ENVIRONMENT: Lives with: lives with their spouse Lives in: House/apartment Stairs: Yes: Internal: 12 steps; on left going up Has following equipment at home: Single point cane, Walker - 2 wheeled, and Environmental consultant - 4 wheeled  OCCUPATION: Retired  PLOF: Independent with basic ADLs and Independent with household mobility with device  PATIENT GOALS: getting better and be able to move around better   NEXT MD VISIT: 06/24/22  OBJECTIVE:   DIAGNOSTIC FINDINGS: FINDINGS: Postsurgical changes of right knee arthroplasty revision with constrained long-stem arthroplasty. No evidence of loosening or fracture. Expected soft tissue changes.   IMPRESSION: Postsurgical changes of right knee arthroplasty revision. No evidence of immediate hardware complication  PATIENT SURVEYS:  FOTO 51  COGNITION: Overall cognitive status: Within functional limits for tasks assessed     SENSATION: WFL   MUSCLE LENGTH: Hamstrings: bilateral tightness  POSTURE: rounded shoulders   LOWER EXTREMITY ROM:  Active ROM Right eval Right 06/30/22  Hip flexion    Hip extension    Hip abduction    Hip adduction    Hip internal rotation    Hip external rotation    Knee flexion 95 105  Knee extension -18 11  Ankle dorsiflexion    Ankle plantarflexion    Ankle inversion    Ankle eversion     (Blank rows = not tested)  LOWER EXTREMITY MMT:  MMT Right eval Left eval  Hip flexion 3+   Hip extension    Hip abduction    Hip adduction    Hip internal rotation    Hip external rotation    Knee flexion 3+   Knee extension 3+    Ankle dorsiflexion    Ankle plantarflexion    Ankle inversion    Ankle eversion     (Blank rows = not tested)   FUNCTIONAL TESTS:  5 times sit to stand: 17.21s Timed up and go (TUG): 15.82s  GAIT: Distance walked: in clinic distances Assistive device utilized: Environmental consultant - 4 wheeled and None Level of assistance: Complete Independence and Modified  independence Comments: able to complete TUG without AD, uses rollator on a regular basis    TODAY'S TREATMENT:  DATE:  07/02/22 NuStep L 5 x 6 min TUG 5x S2S Gait 6 laps ~ 768ft w/ rolator Hamstring Curls 25lb 2x12, RLE 15lb 2x10  Leg Ext 10lb 2x12, RLE 5lb 2x10  Leg press 30lb 2x12 4 in box on airex step ups 2x10 each  06/30/22 NuStep L 5 x 6 min R knee PROM with end range holds, patella mobs S2S 2x10 holding yellow ball  Standing march 2x10 Hamstring Curls 25lb 2x12 Leg Ext 10lb 2x12, RLE 5lb 2x10  6in step ups x10 Lateral 4in step ups  Heel raises 2x15  06/25/22 NuStep L5 x 6 min R knee PROM with end range holds, patella mobs S2S 2x10 holding yellow wball  Resisted gait 30lb all directions x4 each, LOB x 1 Max assit to recover  4in steps up x 10 each, 6in step ups x 10 each  HS curls 25lb 2x10   06/22/22 R knee PROM with end range holds, patella mobs LAQ RLE 3lb 3x10 HS green 2x15 NuStep L 5 x 6 min  Forward & Lateral step ups 4in box 2x10 Heel raise black bar 2x12 Bike L 1 x 3 min  S2S 2x10  06/18/22 NuStep L 4 x 6 min R knee PROM with end range holds, patella mobs LAQ RLE 2lb 2x10 HS curle RLE green 2x12 S2S 2x12 Hip abd ball squeeze 2x12 Standing march 2x10    EVAL 06/16/22    PATIENT EDUCATION:  Education details: HEP and POC Person educated: Patient Education method: Explanation Education comprehension: verbalized understanding  HOME EXERCISE PROGRAM: Access Code: [email or web address] URL: [email or web address] Date: 06/16/2022 Prepared by: Cassie Freer  Exercises - Mini Squat with Counter Support  - 1 x daily - 7 x weekly - 3 sets - 10 reps - Standing Hip Abduction with Unilateral Counter Support  - 1 x daily - 7 x weekly - 3 sets - 10 reps - Standing March with Unilateral Counter Support  - 1 x daily - 7 x weekly - 3  sets - 10 reps - Heel Raises with Counter Support  - 1 x daily - 7 x weekly - 3 sets - 10 reps - Sit to Stand  - 1 x daily - 7 x weekly - 3 sets - 10 reps - Seated Hamstring Stretch  - 1 x daily - 7 x weekly - 2 reps - 30 hold  ASSESSMENT:  CLINICAL IMPRESSION: Patient is a 62 y.o. female who was seen today for physical therapy treatment for R total knee revision. She has progressed meeting some both long and short term goals. No pain reported during session.RLE weakness present with SL extensions needing rest between sets.    OBJECTIVE IMPAIRMENTS: Abnormal gait, decreased mobility, difficulty walking, decreased ROM, decreased strength, and pain.   REHAB POTENTIAL: Good  CLINICAL DECISION MAKING: Stable/uncomplicated  EVALUATION COMPLEXITY: Low   GOALS: Goals reviewed with patient? Yes  SHORT TERM GOALS: Target date: 07/28/22  Patient will be independent with initial HEP. Goal status: Progressing 06/30/22  2.  Patient will demonstrate 5xSTS in < 13s  Baseline: 17.21s Goal status: Met 11.86 07/02/22    LONG TERM GOALS: Target date: 09/01/22  Patient will be independent with advanced/ongoing HEP to improve outcomes and carryover.  Goal status: INITIAL  2.  Patient will report at least 75% improvement in R knee pain to improve QOL. Baseline: 7/10 at worst Goal status: Progressing 50% 07/02/22  3.  Patient will demonstrate improved R knee AROM to >/= 0-120 deg to allow for normal  gait and stair mechanics. Baseline: 18-0-95 Goal status: Progressing 07/02/22  4.  Patient will demonstrate improved functional LE strength as demonstrated by 5/5. Baseline: 3+ Goal status: INITIAL  5.  Patient will be able to ambulate 600' with LRAD and normal gait pattern without increased pain to access community.  Baseline: using rollator walking Goal status: Met 07/02/22  6. Patient will be able to do TUG <12s.  Baseline: 15.82s Goal status: Met 9.35 07/02/22  7.  Patient will report 40  on FOTO (patient reported outcome measure) to demonstrate improved functional ability. Baseline: 51 Goal status: INITIAL   PLAN:  PT FREQUENCY: 2x/week  PT DURATION: 10 weeks  PLANNED INTERVENTIONS: Therapeutic exercises, Therapeutic activity, Neuromuscular re-education, Balance training, Gait training, Patient/Family education, Self Care, Joint mobilization, Stair training, Dry Needling, Cryotherapy, Moist heat, Vasopneumatic device, Ionotophoresis 4mg /ml Dexamethasone, and Manual therapy  PLAN FOR NEXT SESSION: start gym activities for R knee, work on ROM and strengthening    Grayce Sessions, PTA 07/02/2022, 7:59 AM

## 2022-07-03 ENCOUNTER — Other Ambulatory Visit: Payer: Medicaid Other | Admitting: *Deleted

## 2022-07-03 ENCOUNTER — Encounter: Payer: Self-pay | Admitting: *Deleted

## 2022-07-03 NOTE — Patient Outreach (Signed)
Medicaid Managed Care   Nurse Care Manager Note  07/03/2022 Name:  Stacy [name] [facility] MRN:  [id number] DOB:  08-Jun-1960  Stacy [name] is an 62 y.o. year old female who is a primary patient of Marcine Matar, MD.  The [facility] Managed Care Coordination team was consulted for assistance with:    Knee pain  Stacy [name] was given information about Medicaid Managed Care Coordination team services today. Stacy [place] Patient agreed to services and verbal consent obtained.  Engaged with patient by telephone for follow up visit in response to provider referral for case management and/or care coordination services.   Assessments/Interventions:  Review of past medical history, allergies, medications, health status, including review of consultants reports, laboratory and other test data, was performed as part of comprehensive evaluation and provision of chronic care management services.  SDOH (Social Determinants of Health) assessments and interventions performed: SDOH Interventions    Flowsheet Row Patient Outreach Telephone from 07/03/2022 in [place] POPULATION HEALTH DEPARTMENT Patient Outreach Telephone from 05/20/2022 in [place] POPULATION HEALTH DEPARTMENT Patient Outreach Telephone from 03/09/2022 in [place] POPULATION HEALTH DEPARTMENT Patient Outreach Telephone from 01/07/2022 in [place] POPULATION HEALTH DEPARTMENT Patient Outreach Telephone from 10/21/2021 in [facility] Care Coordination  SDOH Interventions       Food Insecurity Interventions -- -- Intervention Not Indicated -- --  Housing Interventions -- -- -- -- Intervention Not Indicated  Transportation Interventions Intervention Not Indicated Payor Benefit Intervention Not Indicated -- Intervention Not Indicated  Alcohol Usage Interventions -- -- -- Intervention Not Indicated (Score <7) --       Care Plan  Allergies  Allergen Reactions   Other Shortness Of Breath     UNSPECIFIED AGENTS Allergic to perfumes and cleaning products   Shellfish Allergy Anaphylaxis    Per allergy test.   Ace Inhibitors Cough and Other (See Comments)        Celebrex [Celecoxib]      upset stomach  Pt is taking med*   Aspirin Nausea Only    stomach upset     Medications Reviewed Today     Reviewed by Stacy Dach, RN (Registered Nurse) on 07/03/22 at 1012  Med List Status: <None>   Medication Order Taking? Sig Documenting Provider Last Dose Status Informant  albuterol (PROVENTIL) (2.5 MG/3ML) 0.083% nebulizer solution [id number] Yes Use as directed up to 3 times daily Georgian Co M, PA-C Taking Active   allopurinol (ZYLOPRIM) 100 MG tablet [id number] Yes TAKE 1 TABLET (100 MG TOTAL) BY MOUTH DAILY.(AM) Anders Simmonds, PA-C Taking Active   atorvastatin (LIPITOR) 40 MG tablet [id number] Yes TAKE 1 TABLET (40 MG TOTAL) BY MOUTH DAILY. (BEDTIME) Marcine Matar, MD Taking Active Self, Pharmacy Records  Blood Glucose Monitoring Suppl (ACCU-CHEK AVIVA PLUS) w/Device KIT [id number] Yes 1 each by Does not apply route 3 (three) times daily. Anders Simmonds, PA-C Taking Active Self, Pharmacy Records  celecoxib (CELEBREX) 200 MG capsule [id number] Yes Take 200 mg by mouth 2 (two) times daily. [provider] Taking Active Self, Pharmacy Records  clopidogrel (PLAVIX) 75 MG tablet [id number] Yes Take 1 tablet (75 mg total) by mouth daily. Georgian Co M, PA-C Taking Active   dapagliflozin propanediol (FARXIGA) 5 MG TABS tablet [id number] Yes Take 1 tablet (5 mg total) by mouth daily before breakfast. Anders Simmonds, PA-C Taking Active   diclofenac Sodium (VOLTAREN) 1 % GEL [id number] Yes Apply 2 g topically 4 (four) times daily.  Patient  taking differently: Apply 2 g topically 4 (four) times daily as needed (joint pain).   Kerrin Champagne, MD Taking Active Self, Pharmacy Records  DULoxetine (CYMBALTA) 60 MG capsule [id number] Yes TAKE 1 CAPSULE (60 MG TOTAL) BY MOUTH  DAILY (AM) Anders Simmonds, PA-C Taking Active   EPINEPHrine 0.3 mg/0.3 mL IJ SOAJ injection [id number] Yes 0.3 mg IM x 1 PRN for allergic reaction Marcine Matar, MD Taking Active Self, Pharmacy Records  ferrous sulfate ([name]) 325 (65 FE) MG tablet [id number] Yes TAKE ONE TABLET ( 325 MG ) BY MOUTH DAILY (AM) Anders Simmonds, PA-C Taking Active   fluticasone (FLONASE) 50 MCG/ACT nasal spray [id number] Yes Place 1 spray into both nostrils daily. Anders Simmonds, [place] Taking Active     Discontinued 04/16/11 0943 Glucagon 1 MG/0.2ML SOSY [id number] Yes 1 mg arrange subcutaneously as needed for severe symptomatic low blood sugar Marcine Matar, MD Taking Active Self, Pharmacy Records  glucose blood (ACCU-CHEK AVIVA PLUS) test strip [id number] Yes Use as instructed Marcine Matar, MD Taking Active Self, Pharmacy Records  glucose chewable tablet 12 g [id number]   Marcine Matar, MD  Active   glycopyrrolate (ROBINUL) 2 MG tablet [id number] Yes Take 2 mg by mouth 3 (three) times daily. [provider] Taking Active Self, Pharmacy Records  hydrOXYzine (ATARAX) 10 MG tablet [id number] Yes TAKE 1 TABLET IN THE MORNING AND NOON AND 2 TABLETS IN THE EVENING AS NEEDED. Anders Simmonds, PA-C Taking Active   Lancets (ACCU-CHEK MULTICLIX) lancets [id number] Yes Use as instructed Marcine Matar, MD Taking Active Self, Pharmacy Records  Lancets [facility] SOFT [place]) lancets [id number] Yes Use as instructed Anders Simmonds, PA-C Taking Active Self, Pharmacy Records    Discontinued 04/16/11 [phone] (Error) loratadine (CLARITIN) 10 MG tablet [id number] Yes TAKE 1 TABLET (10 MG TOTAL) BY MOUTH DAILY. (AM) Marcine Matar, MD Taking Active Self, Pharmacy Records  melatonin 5 MG TABS [id number] Yes Take 5 mg by mouth at bedtime. [provider] Taking Active Self, Pharmacy Records  metFORMIN (GLUCOPHAGE-XR) 750 MG 24 hr tablet [id number] Yes Take 1 tablet (750 mg total) by mouth daily  with breakfast. Anders Simmonds, PA-C Taking Active   methocarbamol (ROBAXIN) 500 MG tablet [id number] Yes TAKE 1 TABLET (500 MG TOTAL) BY MOUTH EVERY 6 (SIX) HOURS AS NEEDED FOR MUSCLE SPASMS. Kirtland Bouchard, PA-C Taking Active   Methylnaltrexone Bromide 8 MG/0.4ML SOLN [id number] Yes Take 0.4 mLs by mouth as needed. Take once daily as needed Curley Spice, MD Taking Active   montelukast (SINGULAIR) 10 MG tablet [id number] Yes Take 10 mg by mouth daily. [provider] Taking Active Self, Pharmacy Records  Multiple Vitamins-Minerals (CENTRUM SILVER 50+WOMEN PO) [id number] Yes Take 1 tablet by mouth daily. [provider] Taking Active Self, Pharmacy Records  oxyCODONE (OXY IR/ROXICODONE) 5 MG immediate release tablet [id number] No Take 1-2 tablets (5-10 mg total) by mouth every 4 (four) hours as needed for moderate pain (pain score 4-6).  Patient not taking: Reported on 07/03/2022   Kirtland Bouchard, PA-C Not Taking Active Self, Pharmacy Records  pantoprazole (PROTONIX) 40 MG tablet [id number] Yes Take 40 mg by mouth daily. [provider] Taking Active Self, Pharmacy Records  potassium chloride SA (KLOR-CON M) 20 MEQ tablet [id number] Yes Take 1 tablet (20 mEq total) by mouth daily. Anders Simmonds, PA-C Taking Active   Respiratory Therapy Supplies (FLUTTER) DEVI [id number] Yes Use after breathing treatment 4 times daily Shan Levans  E, MD Taking Active Self, Pharmacy Records  SYMBICORT 160-4.5 MCG/ACT inhaler [id number] Yes Inhale 1 puff into the lungs 2 (two) times daily. [provider] Taking Active Self, Pharmacy Records  tiotropium (SPIRIVA HANDIHALER) 18 MCG inhalation capsule [id number] Yes Place 1 capsule (18 mcg total) into inhaler and inhale daily. Georgian Co M, [place] Taking Active   triamcinolone cream (KENALOG) 0.1 % [id number] Yes Apply 1 Application topically 2 (two) times daily as needed (irritation). Anders Simmonds, [place] Taking Active    valsartan-hydrochlorothiazide (DIOVAN-HCT) 160-12.5 MG tablet [id number] Yes TAKE ONE TABLET BY MOUTH ONCE DAILY (AM) Anders Simmonds, PA-C Taking Active             Patient Active Problem List   Diagnosis Date Noted   Status post revision of total replacement of right knee 05/12/2022   Personal history of colonic polyps 03/20/2022   Failed total knee, right, subsequent encounter 03/02/2022   Loose right total knee arthroplasty (HCC) 03/02/2022   Mixed stress and urge urinary incontinence 12/04/2021   Functional fecal incontinence 12/04/2021   IBS (irritable bowel syndrome) 11/19/2021   Spondylolisthesis, lumbar region    Other spondylosis with radiculopathy, lumbar region    Other secondary scoliosis, lumbar region    Fusion of spine of lumbar region 03/25/2021   Colon polyps 06/06/2020   Lupus (HCC) 06/06/2020   Allergic rhinitis due to animal (cat) (dog) hair and dander 04/08/2020   Allergic rhinitis due to pollen 04/08/2020   Food allergy 04/08/2020   Acute medial meniscus tear, left, subsequent encounter 03/21/2020   Chronic pain of left knee 02/15/2020   Paresthesia of skin 11/16/2019   History of total knee replacement, right 11/16/2019   Tobacco abuse 11/16/2019   Centrilobular emphysema (HCC) 08/10/2019   Incidental lung nodule, > 3mm and < 8mm 08/10/2019   OSA on CPAP 08/10/2019   Dyspnea on exertion 08/02/2019   Hyperlipidemia 08/02/2019   Lumbar radiculopathy 04/24/2019   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   Post laminectomy syndrome 02/23/2019   Abnormality of gait 02/23/2019   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   Unilateral primary osteoarthritis, left hip 12/28/2018   Lesion of skin of left ear 12/26/2018   Primary osteoarthritis of left hip 12/02/2018   Iron deficiency anemia 10/16/2018   Chronic pain syndrome 09/08/2018   Chronic pain of right knee 08/11/2018   Status post lumbar laminectomy 07/15/2018   Peripheral arterial disease (HCC)  04/05/2018   Moderate persistent asthma without complication 06/29/2017   Environmental and seasonal allergies 06/29/2017   Controlled type 2 diabetes mellitus with diabetic polyneuropathy, without long-term current use of insulin (HCC) 06/29/2017   Perennial allergic rhinitis 04/08/2017   Sensorineural hearing loss (SNHL), bilateral 04/08/2017   Chronic pansinusitis 03/25/2017   Eustachian tube dysfunction, bilateral 03/25/2017   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   Herniation of lumbar intervertebral disc with radiculopathy 10/02/2016    Class: Chronic   Alopecia areata 08/19/2016   Chondromalacia of both patellae 06/03/2015    Class: Chronic   Spinal stenosis, lumbar region, with neurogenic claudication 06/03/2015   Tobacco use disorder 04/25/2015   DJD (degenerative joint disease) of knee 01/04/2015   Hemorrhoid 11/14/2014   Gout of big toe 07/19/2014   Essential hypertension 08/14/2013   Gastroesophageal reflux disease without esophagitis 08/14/2013   COPD (chronic obstructive pulmonary disease) (HCC) 04/17/2011    Conditions to be addressed/monitored per PCP order:   knee pain  Care Plan : RN Care Manager Plan of Care  Updates made by Stacy Dach, RN since 07/03/2022 12:00 AM     Problem: Health Management needs related to DMII      Long-Range Goal: Development of Plan of Care to address Health Management needs related to DMII   Start Date: 10/21/2021  Expected End Date: 06/02/2022  Priority: High  Note:   Current Barriers:  Chronic Disease Management support and education needs related to DMII-Patient post total knee replacement. She is attending PT twice weekly and is making good progress.   RNCM Clinical Goal(s):  Patient will verbalize understanding of plan for management of DMII as evidenced by patient reports take all medications exactly as prescribed and will call provider for medication related questions as evidenced by patient reports and EMR documentation     attend all scheduled medical appointments:   Post Op on 05/27/22, as evidenced by provider documentation        work with pharmacist to address Medication management related to DMII as evidenced by review of EMR and patient or pharmacist report    work with Child psychotherapist to address Limited access to food related to the management of DMII as evidenced by review of EMR and patient or Child psychotherapist report     through collaboration with Medical illustrator, provider, and care team.   Interventions: Inter-disciplinary care team collaboration (see longitudinal plan of care) Evaluation of current treatment plan related to  self management and patient's adherence to plan as established by provider Collaborated with BSW re: food delivery Discussed case closure with RNCM, advised patient to contact RNCM with new needs/barriers  Right Knee Pain  (Status:  Goal Met.)  Long Term Goal Evaluation of current treatment plan related to  right knee pain ,  self-management and patient's adherence to plan as established by provider. Discussed plans with patient for ongoing care management follow up and provided patient with direct contact information for care management team Reviewed medications with patient and discussed requesting refills on needed medications Assessed social determinant of health barriers Reviewed provider post op note and discussed Ensured patient attending PT  Encouraged patient to purchase a knee support for left knee  Patient Goals/Self-Care Activities: Take medications as prescribed   Attend all scheduled provider appointments Call provider office for new concerns or questions  check blood sugar at prescribed times: twice daily take the blood sugar meter to all doctor visits keep a food diary       Follow Up:  Patient agrees to Care Plan and Follow-up.  Plan: The  Patient has been provided with contact information for the Managed Medicaid care management team and has been advised to  call with any health related questions or concerns.  Date/time of next scheduled RN care management/care coordination outreach:  no follow up with RNCM  [name] RN, BSN [place]  Managed [facility] RN Care Coordinator [phone]

## 2022-07-03 NOTE — Patient Instructions (Signed)
Visit Information  [name] was given information about Medicaid Managed Care team care coordination services as a part of their [facility] Community Plan Medicaid benefit. Stacy [name] verbally consented to engagement with the [facility] Managed Care team.   If you are experiencing a medical emergency, please call 911 or report to your local emergency department or urgent care.   If you have a non-emergency medical problem during routine business hours, please contact your provider's office and ask to speak with a nurse.   For questions related to your [facility], please call: [phone] or visit the homepage here: [email or web address]  If you would like to schedule transportation through your [facility], please call the following number at least 2 days in advance of your appointment: [phone]   Rides for urgent appointments can also be made after hours by calling Member Services.  Call the Behavioral Health Crisis Line at [phone]) [phone], at any time, 24 hours a day, 7 days a week. If you are in danger or need immediate medical attention call 911.  If you would like help to quit smoking, call 1-800-QUIT-NOW ((308) 152-5103) OR Espaol: 1-855-Djelo-Ya (1-324-401-0272) o para ms informacin haga clic aqu or Text READY to 536-644 to register via text  Stacy [name],   Please see education materials related to knee pain provided by MyChart link.  Patient verbalizes understanding of instructions and care plan provided today and agrees to view in MyChart. Active MyChart status and patient understanding of how to access instructions and care plan via MyChart confirmed with patient.     Telephone follow up appointment with Managed Medicaid care management team member scheduled for: No further follow up required:    [name] RN, BSN [place]  Managed  [facility] RN Care Coordinator [phone]   Following is a copy of your plan of care:  Care Plan : RN Care Manager Plan of Care  Updates made by [name], RN since 07/03/2022 12:00 AM     Problem: Health Management needs related to DMII      Long-Range Goal: Development of Plan of Care to address Health Management needs related to DMII   Start Date: 10/21/2021  Expected End Date: 06/02/2022  Priority: High  Note:   Current Barriers:  Chronic Disease Management support and education needs related to DMII-Patient post total knee replacement. She is attending PT twice weekly and is making good progress.   RNCM Clinical Goal(s):  Patient will verbalize understanding of plan for management of DMII as evidenced by patient reports take all medications exactly as prescribed and will call provider for medication related questions as evidenced by patient reports and EMR documentation    attend all scheduled medical appointments:   Post Op on 05/27/22, as evidenced by provider documentation        work with pharmacist to address Medication management related to DMII as evidenced by review of EMR and patient or pharmacist report    work with Child psychotherapist to address Limited access to food related to the management of DMII as evidenced by review of EMR and patient or Child psychotherapist report     through collaboration with Medical illustrator, provider, and care team.   Interventions: Inter-disciplinary care team collaboration (see longitudinal plan of care) Evaluation of current treatment plan related to  self management and patient's adherence to plan as established by provider Collaborated with BSW re: food delivery Discussed case closure with RNCM, advised patient to  contact RNCM with new needs/barriers  Right Knee Pain  (Status:  Goal Met.)  Long Term Goal Evaluation of current treatment plan related to  right knee pain ,  self-management and patient's adherence to plan as established by  provider. Discussed plans with patient for ongoing care management follow up and provided patient with direct contact information for care management team Reviewed medications with patient and discussed requesting refills on needed medications Assessed social determinant of health barriers Reviewed provider post op note and discussed Ensured patient attending PT  Encouraged patient to purchase a knee support for left knee  Patient Goals/Self-Care Activities: Take medications as prescribed   Attend all scheduled provider appointments Call provider office for new concerns or questions  check blood sugar at prescribed times: twice daily take the blood sugar meter to all doctor visits keep a food diary

## 2022-07-04 DIAGNOSIS — I10 Essential (primary) hypertension: Secondary | ICD-10-CM | POA: Diagnosis not present

## 2022-07-04 DIAGNOSIS — R32 Unspecified urinary incontinence: Secondary | ICD-10-CM | POA: Diagnosis not present

## 2022-07-04 DIAGNOSIS — R159 Full incontinence of feces: Secondary | ICD-10-CM | POA: Diagnosis not present

## 2022-07-06 ENCOUNTER — Encounter: Payer: Self-pay | Admitting: Neurology

## 2022-07-06 ENCOUNTER — Ambulatory Visit: Payer: Medicaid Other | Admitting: Neurology

## 2022-07-06 VITALS — BP 110/73 | HR 97 | Ht 66.0 in | Wt 150.0 lb

## 2022-07-06 DIAGNOSIS — R251 Tremor, unspecified: Secondary | ICD-10-CM | POA: Diagnosis not present

## 2022-07-06 NOTE — Progress Notes (Signed)
Follow-up Visit   Date: 07/06/22   Stacy [name] MRN: [id number] DOB: [birth year]   Interim History: Stacy [name] is a 62 y.o. left-handed female with diabetes mellitus complicated by neuropathy, hypertension, chronic pain, s/p lumbar surgery x 2, depression, and tobacco use returning to the clinic for follow-up of bilateral hand pain and paresthesisa.  The patient was accompanied to the clinic by self.  IMPRESSION/PLAN: Intermittent tremors of the left hand.  Tremors were not observed on exam today.   further, she does not have any bradykinesia or other features that would suggest parkinsonism.  Given the episodic nature of her symptoms and the fact that it self-resolves within 20-min, I encouraged her to continue to monitor.  If the tremors become constant and interfere with daily activities, we can consider adding medication.   Bilateral hand numbness, unclear etiology.  Extensive neurological work-up including MRI cervical spine and NCS/EMG has been unremarkable.  Further recommendations pending results.   --------------------------------------------------------  History of present illness: Starting around the end of 2021, she began having numbness and tingling of all the fingers, which is worse on the left.  She wakes up at night about 2-3 times per week with her falling asleep.  She has some weakness in the hands.  She has been using wrist braces which provides some relief.  No neck or radicular pain.  She has numbness in the feet from diabetic neuropathy.   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  She lives at home with her husband.    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 She continues to have significant pain and shooting sensation involving both hands.  She wears bilateral wrist braces which helps some. She also complains of neck  discomfort and left shoulder pain. She is seeing Dr. [name] for lumbar spondylosis and will be having lumbar fusion at L3-4 and L4-5.  She has no benefit with PT.  UPDATE 01/12/2022:  She is here for follow-up visit because of worsening right hand paresthesias.  She has been dropping objects. She had NCS/EMG in August 2022 which shows mild right CTS.  She has been compliant using a brace, but it has not helped.  Recently, her overall health has been poor.  She is having a lot of right knee pain (arthritis) and right foot pain (gout).   UPDATE 07/06/2022:  Over the past 1 year, she has noticed intermittent tremors of the left hand.  It occurs every other day and lasts about 20-minutes and self-resolves.  She has noticed that sometimes it occurs when writing.  She denies weakness or pain.   She underwent left TKA and has been recovering from this. She is using a walker and feels that her right knee is now getting weaker because it is compensating for the left.   Medications:  Current Outpatient Medications on File Prior to Visit  Medication Sig Dispense Refill   albuterol (PROVENTIL) (2.5 MG/3ML) 0.083% nebulizer solution Use as directed up to 3 times daily 360 mL 0   allopurinol (ZYLOPRIM) 100 MG tablet TAKE 1 TABLET (100 MG TOTAL) BY MOUTH DAILY.(AM) 90 tablet 3   atorvastatin (LIPITOR) 40 MG tablet TAKE 1 TABLET (40 MG TOTAL) BY MOUTH DAILY. (BEDTIME) 90 tablet 0   Blood Glucose Monitoring Suppl (ACCU-CHEK AVIVA PLUS) w/Device KIT 1 each by Does not apply route 3 (three) times  daily. 1 kit 0   celecoxib (CELEBREX) 200 MG capsule Take 200 mg by mouth 2 (two) times daily.     clopidogrel (PLAVIX) 75 MG tablet Take 1 tablet (75 mg total) by mouth daily. 30 tablet 5   dapagliflozin propanediol (FARXIGA) 5 MG TABS tablet Take 1 tablet (5 mg total) by mouth daily before breakfast. 90 tablet 1   diclofenac Sodium (VOLTAREN) 1 % GEL Apply 2 g topically 4 (four) times daily. (Patient taking differently: Apply 2  g topically 4 (four) times daily as needed (joint pain).) 350 g 5   DULoxetine (CYMBALTA) 60 MG capsule TAKE 1 CAPSULE (60 MG TOTAL) BY MOUTH DAILY (AM) 30 capsule 6   EPINEPHrine 0.3 mg/0.3 mL IJ SOAJ injection 0.3 mg IM x 1 PRN for allergic reaction 1 Device 1   ferrous sulfate ([name]) 325 (65 FE) MG tablet TAKE ONE TABLET ( 325 MG ) BY MOUTH DAILY (AM) 100 tablet 1   fluticasone (FLONASE) 50 MCG/ACT nasal spray Place 1 spray into both nostrils daily. 16 g 6   Glucagon 1 MG/0.2ML SOSY 1 mg arrange subcutaneously as needed for severe symptomatic low blood sugar 0.2 mL 0   glucose blood (ACCU-CHEK AVIVA PLUS) test strip Use as instructed 100 each 12   glycopyrrolate (ROBINUL) 2 MG tablet Take 2 mg by mouth 3 (three) times daily.     hydrOXYzine (ATARAX) 10 MG tablet TAKE 1 TABLET IN THE MORNING AND NOON AND 2 TABLETS IN THE EVENING AS NEEDED. 120 tablet 0   Lancets (ACCU-CHEK MULTICLIX) lancets Use as instructed 100 each 12   Lancets (ACCU-CHEK SOFT TOUCH) lancets Use as instructed 100 each 12   loratadine (CLARITIN) 10 MG tablet TAKE 1 TABLET (10 MG TOTAL) BY MOUTH DAILY. (AM) 90 tablet 0   melatonin 5 MG TABS Take 5 mg by mouth at bedtime.     metFORMIN (GLUCOPHAGE-XR) 750 MG 24 hr tablet Take 1 tablet (750 mg total) by mouth daily with breakfast. 90 tablet 1   methocarbamol (ROBAXIN) 500 MG tablet TAKE 1 TABLET (500 MG TOTAL) BY MOUTH EVERY 6 (SIX) HOURS AS NEEDED FOR MUSCLE SPASMS. 40 tablet 1   Methylnaltrexone Bromide 8 MG/0.4ML SOLN Take 0.4 mLs by mouth as needed. Take once daily as needed 2.1 mL 0   montelukast (SINGULAIR) 10 MG tablet Take 10 mg by mouth daily.     Multiple Vitamins-Minerals (CENTRUM SILVER 50+WOMEN PO) Take 1 tablet by mouth daily.     oxyCODONE (OXY IR/ROXICODONE) 5 MG immediate release tablet Take 1-2 tablets (5-10 mg total) by mouth every 4 (four) hours as needed for moderate pain (pain score 4-6). (Patient not taking: Reported on 07/03/2022) 30 tablet 0    pantoprazole (PROTONIX) 40 MG tablet Take 40 mg by mouth daily.     potassium chloride SA (KLOR-CON M) 20 MEQ tablet Take 1 tablet (20 mEq total) by mouth daily. 90 tablet 0   Respiratory Therapy Supplies (FLUTTER) DEVI Use after breathing treatment 4 times daily 1 each 0   SYMBICORT 160-4.5 MCG/ACT inhaler Inhale 1 puff into the lungs 2 (two) times daily.     tiotropium (SPIRIVA HANDIHALER) 18 MCG inhalation capsule Place 1 capsule (18 mcg total) into inhaler and inhale daily. 30 capsule 1   triamcinolone cream (KENALOG) 0.1 % Apply 1 Application topically 2 (two) times daily as needed (irritation). 45 g 1   valsartan-hydrochlorothiazide (DIOVAN-HCT) 160-12.5 MG tablet TAKE ONE TABLET BY MOUTH ONCE DAILY (AM) 90 tablet 0   [  DISCONTINUED] Fluticasone-Salmeterol (ADVAIR) 500-50 MCG/DOSE AEPB Inhale 1 puff into the lungs every 12 (twelve) hours.       [DISCONTINUED] lisinopril (PRINIVIL,ZESTRIL) 40 MG tablet Take 40 mg by mouth daily.       Current Facility-Administered Medications on File Prior to Visit  Medication Dose Route Frequency Provider Last Rate Last Admin   glucose chewable tablet 12 g  3 tablet Oral Once Marcine Matar, MD        Allergies:  Allergies  Allergen Reactions   Other Shortness Of Breath    UNSPECIFIED AGENTS Allergic to perfumes and cleaning products   Shellfish Allergy Anaphylaxis    Per allergy test.   Ace Inhibitors Cough and Other (See Comments)        Celebrex [Celecoxib]      upset stomach  Pt is taking med*   Aspirin Nausea Only    stomach upset     Vital Signs:  LMP 09/01/2010   Neurological Exam: MENTAL STATUS including orientation to time, place, person, recent and remote memory, attention span and concentration, language, and fund of knowledge is normal.  Speech is not dysarthric.  CRANIAL NERVES:   Pupils equal round and reactive to light.  Normal conjugate, extra-ocular eye movements in all directions of gaze.  No ptosis.  Face is  symmetric.  MOTOR:  Motor strength is 5/5 in all extremities.  No atrophy, fasciculations or abnormal movements.  No pronator drift.  Tone is normal.    MSRs:  Reflexes are 2+/4 throughout  SENSORY:  Vibration is intact throughout  COORDINATION/GAIT:  Finger tapping intact. Gait is slow, assisted by walker.    Data: NCS/EMG of the upper extremities 09/17/2020: Right median neuropathy at or distal to the wrist (mild), consistent with a clinical diagnosis of carpal tunnel syndrome.    MRI cervical spine wo contrast 01/12/2021: Mild degenerative changes without significant stenosis.   NCS/EMG of bilateral arms 02/20/2022:  Normal   Thank you for allowing me to participate in patient's care.  If I can answer any additional questions, I would be pleased to do so.    Sincerely,    Shakeita Vandevander K. Allena Katz, DO

## 2022-07-07 ENCOUNTER — Encounter: Payer: Self-pay | Admitting: Podiatry

## 2022-07-07 ENCOUNTER — Ambulatory Visit (INDEPENDENT_AMBULATORY_CARE_PROVIDER_SITE_OTHER): Payer: Medicaid Other | Admitting: Podiatry

## 2022-07-07 VITALS — BP 141/71

## 2022-07-07 DIAGNOSIS — B351 Tinea unguium: Secondary | ICD-10-CM

## 2022-07-07 DIAGNOSIS — E1142 Type 2 diabetes mellitus with diabetic polyneuropathy: Secondary | ICD-10-CM | POA: Diagnosis not present

## 2022-07-07 DIAGNOSIS — M79676 Pain in unspecified toe(s): Secondary | ICD-10-CM | POA: Diagnosis not present

## 2022-07-07 NOTE — Progress Notes (Unsigned)
  Subjective:  Patient ID: Stacy [name], female    DOB: 01/16/1961,  MRN: [id number]  Stacy [name] presents to clinic today for {jgcomplaint:23593}  Chief Complaint  Patient presents with   Nail Problem    DFC,eferring Provider Marcine Matar, MD,LOV:05/24,A1C:6.6,BS:no finger sticks today      New problem(s): None. {jgcomplaint:23593}  PCP is Marcine Matar, MD.  Allergies  Allergen Reactions   Other Shortness Of Breath    UNSPECIFIED AGENTS Allergic to perfumes and cleaning products   Shellfish Allergy Anaphylaxis    Per allergy test.   Ace Inhibitors Cough and Other (See Comments)        Celebrex [Celecoxib]      upset stomach  Pt is taking med*   [employer] Other (See Comments)   Aspirin Nausea Only and Other (See Comments)    stomach upset  Other reaction(s): Nausea Only    Review of Systems: Negative except as noted in the HPI.  Objective: No changes noted in today's physical examination. Vitals:   07/07/22 1006  BP: (!) 141/71   Stacy [name] is a pleasant 62 y.o. female {jgbodyhabitus:24098} AAO x 3.  Assessment/Plan: No diagnosis found.  No orders of the defined types were placed in this encounter.   None {Jgplan:23602::"-Patient/POA to call should there be question/concern in the interim."}   No follow-ups on file.  [name], DPM

## 2022-07-07 NOTE — Therapy (Signed)
OUTPATIENT PHYSICAL THERAPY LOWER EXTREMITY TREATMENT   Patient Name: Stacy [name] MRN: [id number] DOB:23-Sep-1960, 62 y.o., female Today's Date: 07/08/2022  END OF SESSION:  PT End of Session - 07/08/22 0800     Visit Number 7    Date for PT Re-Evaluation 09/01/22    PT Start Time 0800    PT Stop Time 0845    PT Time Calculation (min) 45 min    Activity Tolerance Patient tolerated treatment well    Behavior During Therapy WFL for tasks assessed/performed             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   Asthma    COPD (chronic obstructive pulmonary disease) (HCC)    Depression    Diabetes mellitus    Type II   GERD (gastroesophageal reflux disease)    Pt on Protonix daily   Glaucoma    Gout    Headache(784.0)    otc meds prn   Hyperlipidemia    Hypertension    Irritable bowel syndrome 11/19/2010   Neuropathy    Pneumonia YRS AGO   Restless legs    Shortness of breath    07/14/2018- uses  4 times a day   Sleep apnea    Past Surgical History:  Procedure Laterality Date   BACK SURGERY     feb 21, 23   CHOLECYSTECTOMY     COLONOSCOPY     ENDOMETRIAL ABLATION  10/2010   HERNIA REPAIR     umbicial hernia   JOINT REPLACEMENT Left 03/14/2019   Dr. Magnus Ivan hip   KNEE ARTHROSCOPY Left    06/07/2017 Dr. August [facility]   KNEE ARTHROSCOPY Left 03/21/2020   Procedure: LEFT KNEE ARTHROSCOPY WITH PARTIAL MEDIAL MENISCECTOMY;  Surgeon: [name], MD;  Location: [name];  Service: Orthopedics;  Laterality: Left;   KNEE CLOSED REDUCTION Right 12/06/2015   Procedure: CLOSED MANIPULATION RIGHT KNEE;  Surgeon: Kerrin Champagne, MD;  Location: [facility];  Service: Orthopedics;  Laterality: Right;   KNEE CLOSED REDUCTION Right 01/17/2016   Procedure: CLOSED MANIPULATION RIGHT KNEE;  Surgeon: Kerrin Champagne,  MD;  Location: [facility];  Service: Orthopedics;  Laterality: Right;   KNEE JOINT MANIPULATION Right 12/06/2015   LACRIMAL TUBE INSERTION Bilateral 03/01/2019   Procedure: LACRIMAL TUBE INSERTION;  Surgeon: Aura Camps, MD;  Location: [name];  Service: Ophthalmology;  Laterality: Bilateral;   LACRIMAL TUBE REMOVAL Bilateral 05/03/2019   Procedure: BILATERAL NASOLACRIMAL DUCT PROBING, IIRIGATION AND TUBE REMOVAL BOTH EYES;  Surgeon: Aura Camps, MD;  Location: [name];  Service: Ophthalmology;  Laterality: Bilateral;   LUMBAR LAMINECTOMY/DECOMPRESSION MICRODISCECTOMY N/A 06/03/2015   Procedure: Bilateral lateral recess decompression L2-3, L3-4, L4-5;  Surgeon: Kerrin Champagne, MD;  Location: [facility];  Service: Orthopedics;  Laterality: N/A;   LUMBAR LAMINECTOMY/DECOMPRESSION MICRODISCECTOMY N/A 10/02/2016   Procedure: Right L5-S1 Lateral Recess Decompression  microdiscectomy;  Surgeon: Kerrin Champagne, MD;  Location: [facility];  Service: Orthopedics;  Laterality: N/A;   LUMBAR LAMINECTOMY/DECOMPRESSION MICRODISCECTOMY N/A 07/15/2018   Procedure: LEFT L3-4 MICRODISCECTOMY;  Surgeon: Kerrin Champagne, MD;  Location: [name];  Service: Orthopedics;  Laterality: N/A;   svd      x 2   TEAR DUCT PROBING Bilateral 03/01/2019   Procedure: TEAR DUCT PROBING  WITH IRRIGATION;  Surgeon: Aura Camps, MD;  Location: [facility];  Service: Ophthalmology;  Laterality: Bilateral;   TOTAL HIP ARTHROPLASTY Left 03/14/2019   Procedure: LEFT TOTAL HIP ARTHROPLASTY ANTERIOR APPROACH;  Surgeon: [name], MD;  Location: [facility];  Service: Orthopedics;  Laterality: Left;   TOTAL KNEE ARTHROPLASTY Right 09/06/2015   Procedure: RIGHT TOTAL KNEE ARTHROPLASTY;  Surgeon: Kerrin Champagne, MD;  Location: [facility];  Service: Orthopedics;  Laterality: Right;   TOTAL KNEE REVISION Right 05/12/2022   Procedure: RIGHT TOTAL KNEE REVISION ARTHROPLASTY;  Surgeon: [name], MD;  Location: [facility];  Service: Orthopedics;  Laterality: Right;   TUBAL LIGATION     UPPER GASTROINTESTINAL ENDOSCOPY  04/28/2011   Patient Active Problem List   Diagnosis Date Noted   Status post revision of total replacement of right knee 05/12/2022   Personal history of colonic polyps 03/20/2022   Failed total knee, right, subsequent encounter 03/02/2022   Loose right total knee arthroplasty (HCC) 03/02/2022   Mixed stress and urge urinary incontinence 12/04/2021   Functional fecal incontinence 12/04/2021   IBS (irritable bowel syndrome) 11/19/2021   Spondylolisthesis, lumbar region    Other spondylosis with radiculopathy, lumbar region    Other secondary scoliosis, lumbar region    Fusion of spine of lumbar region 03/25/2021   Colon polyps 06/06/2020   Lupus (HCC) 06/06/2020   Allergic rhinitis due to animal (cat) (dog) hair and dander 04/08/2020   Allergic rhinitis due to pollen 04/08/2020   Food allergy 04/08/2020   Acute medial meniscus tear, left, subsequent encounter 03/21/2020   Chronic pain of left knee 02/15/2020   Paresthesia of skin 11/16/2019   History of total knee replacement, right 11/16/2019   Tobacco abuse 11/16/2019   Centrilobular emphysema (HCC) 08/10/2019   Incidental lung nodule, > 3mm and < 8mm 08/10/2019   OSA on CPAP 08/10/2019   Dyspnea on exertion 08/02/2019   Hyperlipidemia 08/02/2019   Lumbar radiculopathy 04/24/2019   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   Post laminectomy syndrome 02/23/2019   Abnormality of gait 02/23/2019   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   Unilateral primary osteoarthritis, left hip 12/28/2018   Lesion of skin of left ear 12/26/2018   Primary osteoarthritis of left hip 12/02/2018   Iron deficiency anemia 10/16/2018   Chronic pain syndrome 09/08/2018   Chronic pain of right knee 08/11/2018   Status post lumbar laminectomy 07/15/2018   Peripheral arterial disease (HCC) 04/05/2018   Moderate  persistent asthma without complication 06/29/2017   Environmental and seasonal allergies 06/29/2017   Controlled type 2 diabetes mellitus with diabetic polyneuropathy, without long-term current use of insulin (HCC) 06/29/2017   Perennial allergic rhinitis 04/08/2017   Sensorineural hearing loss (SNHL), bilateral 04/08/2017   Chronic pansinusitis 03/25/2017   Eustachian tube dysfunction, bilateral 03/25/2017   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   Herniation of lumbar intervertebral disc with radiculopathy 10/02/2016    Class: Chronic   Alopecia areata 08/19/2016   Chondromalacia of both patellae 06/03/2015    Class: Chronic   Spinal stenosis, lumbar region, with neurogenic claudication 06/03/2015   Tobacco use disorder 04/25/2015   DJD (degenerative joint disease) of knee 01/04/2015   Hemorrhoid 11/14/2014   Gout of big toe 07/19/2014   Essential hypertension 08/14/2013   Gastroesophageal reflux disease without esophagitis 08/14/2013   COPD (chronic obstructive pulmonary disease) (HCC) 04/17/2011    PCP: Jonah Blue  REFERRING PROVIDER: Allie Bossier  REFERRING DIAG: [phone] (ICD-10-CM) -  Status post total right knee replacement   THERAPY DIAG:  Acute pain of right knee  Muscle weakness (generalized)  Stiffness of right knee, not elsewhere classified  Difficulty in walking, not elsewhere classified  Rationale for Evaluation and Treatment: Rehabilitation  ONSET DATE: 05/12/22  SUBJECTIVE:   SUBJECTIVE STATEMENT: I am doing good, the left one acts up and gives out on me sometimes   PERTINENT HISTORY: L hip replacement, L knee scope, R knee replacement and reduction now revision  PAIN:  Are you having pain? Yes: NPRS scale: 0/10 Pain location: R knee Pain description: throbbing, aching  Aggravating factors: standing or walking too long Relieving factors: ice, pain meds   PRECAUTIONS: None  WEIGHT BEARING RESTRICTIONS: No  FALLS:  Has patient fallen in last 6  months? Yes. Number of falls 5, knees buckle on me   LIVING ENVIRONMENT: Lives with: lives with their spouse Lives in: House/apartment Stairs: Yes: Internal: 12 steps; on left going up Has following equipment at home: Single point cane, Walker - 2 wheeled, and Environmental consultant - 4 wheeled  OCCUPATION: Retired  PLOF: Independent with basic ADLs and Independent with household mobility with device  PATIENT GOALS: getting better and be able to move around better   NEXT MD VISIT: 06/24/22  OBJECTIVE:   DIAGNOSTIC FINDINGS: FINDINGS: Postsurgical changes of right knee arthroplasty revision with constrained long-stem arthroplasty. No evidence of loosening or fracture. Expected soft tissue changes.   IMPRESSION: Postsurgical changes of right knee arthroplasty revision. No evidence of immediate hardware complication  PATIENT SURVEYS:  FOTO 51  COGNITION: Overall cognitive status: Within functional limits for tasks assessed     SENSATION: WFL   MUSCLE LENGTH: Hamstrings: bilateral tightness  POSTURE: rounded shoulders   LOWER EXTREMITY ROM:  Active ROM Right eval Right 06/30/22  Hip flexion    Hip extension    Hip abduction    Hip adduction    Hip internal rotation    Hip external rotation    Knee flexion 95 105  Knee extension -18 11  Ankle dorsiflexion    Ankle plantarflexion    Ankle inversion    Ankle eversion     (Blank rows = not tested)  LOWER EXTREMITY MMT:  MMT Right eval Right  07/08/22  Hip flexion 3+ 5  Hip extension    Hip abduction    Hip adduction    Hip internal rotation    Hip external rotation    Knee flexion 3+ 5  Knee extension 3+  5  Ankle dorsiflexion    Ankle plantarflexion    Ankle inversion    Ankle eversion     (Blank rows = not tested)   FUNCTIONAL TESTS:  5 times sit to stand: 17.21s Timed up and go (TUG): 15.82s  GAIT: Distance walked: in clinic distances Assistive device utilized: Environmental consultant - 4 wheeled and None Level of  assistance: Complete Independence and Modified independence Comments: able to complete TUG without AD, uses rollator on a regular basis    TODAY'S TREATMENT:  DATE:  07/08/22 NuStep L5 x62mins  Walking 5 big laps in gym without AD   Leg ext 10# 2x10, RLE 5# 2x10  HS curls 25# 2x10, RLE 15# 2x10 Leg press 40# 2x10, RLE 20# x10 Step ups 6"  Step downs 4"   07/02/22 NuStep L 5 x 6 min TUG 5x S2S Gait 6 laps ~ 761ft w/ rolator Hamstring Curls 25lb 2x12, RLE 15lb 2x10  Leg Ext 10lb 2x12, RLE 5lb 2x10  Leg press 30lb 2x12 4 in box on airex step ups 2x10 each  06/30/22 NuStep L 5 x 6 min R knee PROM with end range holds, patella mobs S2S 2x10 holding yellow ball  Standing march 2x10 Hamstring Curls 25lb 2x12 Leg Ext 10lb 2x12, RLE 5lb 2x10  6in step ups x10 Lateral 4in step ups  Heel raises 2x15  06/25/22 NuStep L5 x 6 min R knee PROM with end range holds, patella mobs S2S 2x10 holding yellow wball  Resisted gait 30lb all directions x4 each, LOB x 1 Max assit to recover  4in steps up x 10 each, 6in step ups x 10 each  HS curls 25lb 2x10   06/22/22 R knee PROM with end range holds, patella mobs LAQ RLE 3lb 3x10 HS green 2x15 NuStep L 5 x 6 min  Forward & Lateral step ups 4in box 2x10 Heel raise black bar 2x12 Bike L 1 x 3 min  S2S 2x10  06/18/22 NuStep L 4 x 6 min R knee PROM with end range holds, patella mobs LAQ RLE 2lb 2x10 HS curle RLE green 2x12 S2S 2x12 Hip abd ball squeeze 2x12 Standing march 2x10    EVAL 06/16/22    PATIENT EDUCATION:  Education details: HEP and POC Person educated: Patient Education method: Explanation Education comprehension: verbalized understanding  HOME EXERCISE PROGRAM: Access Code: [email or web address] URL: [email or web address] Date: 06/16/2022 Prepared by: Cassie Freer  Exercises - Mini Squat  with Counter Support  - 1 x daily - 7 x weekly - 3 sets - 10 reps - Standing Hip Abduction with Unilateral Counter Support  - 1 x daily - 7 x weekly - 3 sets - 10 reps - Standing March with Unilateral Counter Support  - 1 x daily - 7 x weekly - 3 sets - 10 reps - Heel Raises with Counter Support  - 1 x daily - 7 x weekly - 3 sets - 10 reps - Sit to Stand  - 1 x daily - 7 x weekly - 3 sets - 10 reps - Seated Hamstring Stretch  - 1 x daily - 7 x weekly - 2 reps - 30 hold  ASSESSMENT:  CLINICAL IMPRESSION: Patient was seen today for physical therapy treatment for R total knee revision. She does well with walking today, is able to demonstrate more normal gait mechanics today without AD.  Still has some limitations with ROM, but has met her strength goals. Does well on machines today and with isolated RLE strengthening. Most difficulty with 4" anterior step downs today.   OBJECTIVE IMPAIRMENTS: Abnormal gait, decreased mobility, difficulty walking, decreased ROM, decreased strength, and pain.   REHAB POTENTIAL: Good  CLINICAL DECISION MAKING: Stable/uncomplicated  EVALUATION COMPLEXITY: Low   GOALS: Goals reviewed with patient? Yes  SHORT TERM GOALS: Target date: 07/28/22  Patient will be independent with initial HEP. Goal status: Progressing 06/30/22  2.  Patient will demonstrate 5xSTS in < 13s  Baseline: 17.21s Goal status: Met 11.86 07/02/22    LONG TERM GOALS: Target date:  09/01/22  Patient will be independent with advanced/ongoing HEP to improve outcomes and carryover.  Goal status: INITIAL  2.  Patient will report at least 75% improvement in R knee pain to improve QOL. Baseline: 7/10 at worst Goal status: Progressing 50% 07/02/22  3.  Patient will demonstrate improved R knee AROM to >/= 0-120 deg to allow for normal gait and stair mechanics. Baseline: 18-0-95 Goal status: Progressing 07/02/22  4.  Patient will demonstrate improved functional LE strength as demonstrated by  5/5. Baseline: 3+, 5/5 07/08/22 Goal status: MET  5.  Patient will be able to ambulate 600' with LRAD and normal gait pattern without increased pain to access community.  Baseline: using rollator walking Goal status: Met 07/02/22  6. Patient will be able to do TUG <12s.  Baseline: 15.82s Goal status: Met 9.35 07/02/22  7.  Patient will report 47 on FOTO (patient reported outcome measure) to demonstrate improved functional ability. Baseline: 51 Goal status: INITIAL   PLAN:  PT FREQUENCY: 2x/week  PT DURATION: 10 weeks  PLANNED INTERVENTIONS: Therapeutic exercises, Therapeutic activity, Neuromuscular re-education, Balance training, Gait training, Patient/Family education, Self Care, Joint mobilization, Stair training, Dry Needling, Cryotherapy, Moist heat, Vasopneumatic device, Ionotophoresis 4mg /ml Dexamethasone, and Manual therapy  PLAN FOR NEXT SESSION: start gym activities for R knee, work on ROM and [employer], PT 07/08/2022, 8:44 AM

## 2022-07-08 ENCOUNTER — Ambulatory Visit: Payer: Medicaid Other | Attending: Orthopaedic Surgery

## 2022-07-08 DIAGNOSIS — M25561 Pain in right knee: Secondary | ICD-10-CM | POA: Diagnosis present

## 2022-07-08 DIAGNOSIS — R262 Difficulty in walking, not elsewhere classified: Secondary | ICD-10-CM | POA: Insufficient documentation

## 2022-07-08 DIAGNOSIS — R296 Repeated falls: Secondary | ICD-10-CM | POA: Diagnosis present

## 2022-07-08 DIAGNOSIS — M25661 Stiffness of right knee, not elsewhere classified: Secondary | ICD-10-CM | POA: Insufficient documentation

## 2022-07-08 DIAGNOSIS — M6281 Muscle weakness (generalized): Secondary | ICD-10-CM | POA: Insufficient documentation

## 2022-07-08 DIAGNOSIS — F331 Major depressive disorder, recurrent, moderate: Secondary | ICD-10-CM | POA: Diagnosis not present

## 2022-07-10 ENCOUNTER — Ambulatory Visit: Payer: Medicaid Other | Admitting: Physical Therapy

## 2022-07-10 ENCOUNTER — Encounter: Payer: Self-pay | Admitting: Physical Therapy

## 2022-07-10 DIAGNOSIS — R262 Difficulty in walking, not elsewhere classified: Secondary | ICD-10-CM

## 2022-07-10 DIAGNOSIS — M25561 Pain in right knee: Secondary | ICD-10-CM | POA: Diagnosis not present

## 2022-07-10 DIAGNOSIS — M25661 Stiffness of right knee, not elsewhere classified: Secondary | ICD-10-CM

## 2022-07-10 DIAGNOSIS — R296 Repeated falls: Secondary | ICD-10-CM

## 2022-07-10 DIAGNOSIS — M6281 Muscle weakness (generalized): Secondary | ICD-10-CM

## 2022-07-10 NOTE — Therapy (Signed)
OUTPATIENT PHYSICAL THERAPY LOWER EXTREMITY TREATMENT   Patient Name: Stacy [name] [facility] MRN: [id number] DOB:1960-07-13, 62 y.o., female Today's Date: 07/10/2022  END OF SESSION:  PT End of Session - 07/10/22 0752     Visit Number 8    Date for PT Re-Evaluation 09/01/22    PT Start Time 0753    PT Stop Time 0835    PT Time Calculation (min) 42 min    Activity Tolerance Patient tolerated treatment well    Behavior During Therapy [facility] for tasks assessed/performed             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   Asthma    COPD (chronic obstructive pulmonary disease) (HCC)    Depression    Diabetes mellitus    Type II   GERD (gastroesophageal reflux disease)    Pt on Protonix daily   Glaucoma    Gout    Headache(784.0)    otc meds prn   Hyperlipidemia    Hypertension    Irritable bowel syndrome 11/19/2010   Neuropathy    Pneumonia YRS AGO   Restless legs    Shortness of breath    07/14/2018- uses  4 times a day   Sleep apnea    Past Surgical History:  Procedure Laterality Date   BACK SURGERY     feb 21, 23   CHOLECYSTECTOMY     COLONOSCOPY     ENDOMETRIAL ABLATION  10/2010   HERNIA REPAIR     umbicial hernia   JOINT REPLACEMENT Left 03/14/2019   Dr. Magnus Ivan hip   KNEE ARTHROSCOPY Left    06/07/2017 Dr. August [facility]   KNEE ARTHROSCOPY Left 03/21/2020   Procedure: LEFT KNEE ARTHROSCOPY WITH PARTIAL MEDIAL MENISCECTOMY;  Surgeon: [name], MD;  Location: [facility];  Service: Orthopedics;  Laterality: Left;   KNEE CLOSED REDUCTION Right 12/06/2015   Procedure: CLOSED MANIPULATION RIGHT KNEE;  Surgeon: Kerrin Champagne, MD;  Location: [facility];  Service: Orthopedics;  Laterality: Right;   KNEE CLOSED REDUCTION Right 01/17/2016   Procedure: CLOSED MANIPULATION RIGHT KNEE;  Surgeon: Kerrin Champagne,  MD;  Location: [facility];  Service: Orthopedics;  Laterality: Right;   KNEE JOINT MANIPULATION Right 12/06/2015   LACRIMAL TUBE INSERTION Bilateral 03/01/2019   Procedure: LACRIMAL TUBE INSERTION;  Surgeon: Aura Camps, MD;  Location: [facility];  Service: Ophthalmology;  Laterality: Bilateral;   LACRIMAL TUBE REMOVAL Bilateral 05/03/2019   Procedure: BILATERAL NASOLACRIMAL DUCT PROBING, IIRIGATION AND TUBE REMOVAL BOTH EYES;  Surgeon: Aura Camps, MD;  Location: [facility];  Service: Ophthalmology;  Laterality: Bilateral;   LUMBAR LAMINECTOMY/DECOMPRESSION MICRODISCECTOMY N/A 06/03/2015   Procedure: Bilateral lateral recess decompression L2-3, L3-4, L4-5;  Surgeon: Kerrin Champagne, MD;  Location: [facility];  Service: Orthopedics;  Laterality: N/A;   LUMBAR LAMINECTOMY/DECOMPRESSION MICRODISCECTOMY N/A 10/02/2016   Procedure: Right L5-S1 Lateral Recess Decompression  microdiscectomy;  Surgeon: Kerrin Champagne, MD;  Location: [facility] OR;  Service: Orthopedics;  Laterality: N/A;   LUMBAR LAMINECTOMY/DECOMPRESSION MICRODISCECTOMY N/A 07/15/2018   Procedure: LEFT L3-4 MICRODISCECTOMY;  Surgeon: Kerrin Champagne, MD;  Location: [facility] OR;  Service: Orthopedics;  Laterality: N/A;   svd      x 2   TEAR DUCT PROBING Bilateral 03/01/2019   Procedure: TEAR DUCT PROBING  WITH IRRIGATION;  Surgeon: Aura Camps, MD;  Location: [facility];  Service: Ophthalmology;  Laterality: Bilateral;   TOTAL HIP ARTHROPLASTY Left 03/14/2019   Procedure: LEFT TOTAL HIP ARTHROPLASTY ANTERIOR APPROACH;  Surgeon: [name], MD;  Location: [facility];  Service: Orthopedics;  Laterality: Left;   TOTAL KNEE ARTHROPLASTY Right 09/06/2015   Procedure: RIGHT TOTAL KNEE ARTHROPLASTY;  Surgeon: Kerrin Champagne, MD;  Location: [facility];  Service: Orthopedics;  Laterality: Right;   TOTAL KNEE REVISION Right 05/12/2022   Procedure: RIGHT TOTAL KNEE REVISION ARTHROPLASTY;  Surgeon: [name], MD;  Location: [facility];  Service: Orthopedics;  Laterality: Right;   TUBAL LIGATION     UPPER GASTROINTESTINAL ENDOSCOPY  04/28/2011   Patient Active Problem List   Diagnosis Date Noted   Status post revision of total replacement of right knee 05/12/2022   Personal history of colonic polyps 03/20/2022   Failed total knee, right, subsequent encounter 03/02/2022   Loose right total knee arthroplasty (HCC) 03/02/2022   Mixed stress and urge urinary incontinence 12/04/2021   Functional fecal incontinence 12/04/2021   IBS (irritable bowel syndrome) 11/19/2021   Spondylolisthesis, lumbar region    Other spondylosis with radiculopathy, lumbar region    Other secondary scoliosis, lumbar region    Fusion of spine of lumbar region 03/25/2021   Colon polyps 06/06/2020   Lupus (HCC) 06/06/2020   Allergic rhinitis due to animal (cat) (dog) hair and dander 04/08/2020   Allergic rhinitis due to pollen 04/08/2020   Food allergy 04/08/2020   Acute medial meniscus tear, left, subsequent encounter 03/21/2020   Chronic pain of left knee 02/15/2020   Paresthesia of skin 11/16/2019   History of total knee replacement, right 11/16/2019   Tobacco abuse 11/16/2019   Centrilobular emphysema (HCC) 08/10/2019   Incidental lung nodule, > 3mm and < 8mm 08/10/2019   OSA on CPAP 08/10/2019   Dyspnea on exertion 08/02/2019   Hyperlipidemia 08/02/2019   Lumbar radiculopathy 04/24/2019   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   Post laminectomy syndrome 02/23/2019   Abnormality of gait 02/23/2019   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   Unilateral primary osteoarthritis, left hip 12/28/2018   Lesion of skin of left ear 12/26/2018   Primary osteoarthritis of left hip 12/02/2018   Iron deficiency anemia 10/16/2018   Chronic pain syndrome 09/08/2018   Chronic pain of right knee 08/11/2018   Status post lumbar laminectomy 07/15/2018   Peripheral arterial disease (HCC) 04/05/2018   Moderate  persistent asthma without complication 06/29/2017   Environmental and seasonal allergies 06/29/2017   Controlled type 2 diabetes mellitus with diabetic polyneuropathy, without long-term current use of insulin (HCC) 06/29/2017   Perennial allergic rhinitis 04/08/2017   Sensorineural hearing loss (SNHL), bilateral 04/08/2017   Chronic pansinusitis 03/25/2017   Eustachian tube dysfunction, bilateral 03/25/2017   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   Herniation of lumbar intervertebral disc with radiculopathy 10/02/2016    Class: Chronic   Alopecia areata 08/19/2016   Chondromalacia of both patellae 06/03/2015    Class: Chronic   Spinal stenosis, lumbar region, with neurogenic claudication 06/03/2015   Tobacco use disorder 04/25/2015   DJD (degenerative joint disease) of knee 01/04/2015   Hemorrhoid 11/14/2014   Gout of big toe 07/19/2014   Essential hypertension 08/14/2013   Gastroesophageal reflux disease without esophagitis 08/14/2013   COPD (chronic obstructive pulmonary disease) (HCC) 04/17/2011    PCP: Jonah Blue  REFERRING PROVIDER: Allie Bossier  REFERRING DIAG: [phone] (ICD-10-CM) -  Status post total right knee replacement   THERAPY DIAG:  Acute pain of right knee  Muscle weakness (generalized)  Stiffness of right knee, not elsewhere classified  Difficulty in walking, not elsewhere classified  Repeated falls  Rationale for Evaluation and Treatment: Rehabilitation  ONSET DATE: 05/12/22  SUBJECTIVE:   SUBJECTIVE STATEMENT: "I feel good no pain"  PERTINENT HISTORY: L hip replacement, L knee scope, R knee replacement and reduction now revision  PAIN:  Are you having pain? Yes: NPRS scale: 0/10 Pain location: R knee Pain description: throbbing, aching  Aggravating factors: standing or walking too long Relieving factors: ice, pain meds   PRECAUTIONS: None  WEIGHT BEARING RESTRICTIONS: No  FALLS:  Has patient fallen in last 6 months? Yes. Number of falls 5,  knees buckle on me   LIVING ENVIRONMENT: Lives with: lives with their spouse Lives in: House/apartment Stairs: Yes: Internal: 12 steps; on left going up Has following equipment at home: Single point cane, Walker - 2 wheeled, and Environmental consultant - 4 wheeled  OCCUPATION: Retired  PLOF: Independent with basic ADLs and Independent with household mobility with device  PATIENT GOALS: getting better and be able to move around better   NEXT MD VISIT: 06/24/22  OBJECTIVE:   DIAGNOSTIC FINDINGS: FINDINGS: Postsurgical changes of right knee arthroplasty revision with constrained long-stem arthroplasty. No evidence of loosening or fracture. Expected soft tissue changes.   IMPRESSION: Postsurgical changes of right knee arthroplasty revision. No evidence of immediate hardware complication  PATIENT SURVEYS:  FOTO 51  COGNITION: Overall cognitive status: Within functional limits for tasks assessed     SENSATION: WFL   MUSCLE LENGTH: Hamstrings: bilateral tightness  POSTURE: rounded shoulders   LOWER EXTREMITY ROM:  Active ROM Right eval Right 06/30/22 Right  07/10/22  Hip flexion     Hip extension     Hip abduction     Hip adduction     Hip internal rotation     Hip external rotation     Knee flexion 95 105 105  Knee extension -18 11 7   Ankle dorsiflexion     Ankle plantarflexion     Ankle inversion     Ankle eversion      (Blank rows = not tested)  LOWER EXTREMITY MMT:  MMT Right eval Right  07/08/22  Hip flexion 3+ 5  Hip extension    Hip abduction    Hip adduction    Hip internal rotation    Hip external rotation    Knee flexion 3+ 5  Knee extension 3+  5  Ankle dorsiflexion    Ankle plantarflexion    Ankle inversion    Ankle eversion     (Blank rows = not tested)   FUNCTIONAL TESTS:  5 times sit to stand: 17.21s Timed up and go (TUG): 15.82s  GAIT: Distance walked: in clinic distances Assistive device utilized: Environmental consultant - 4 wheeled and None Level of  assistance: Complete Independence and Modified independence Comments: able to complete TUG without AD, uses rollator on a regular basis    TODAY'S TREATMENT:  DATE:  07/10/22 NuStep L5 x LE only  Walking 5 big laps in gym without AD   S2S LE on airex holding yellow ball 2x12 R knee PROM flex & Ext  HS curls 25lb 2x12, RLE 15lb 2x10 Leg Ext 10lb 2x12, Single leg 5lb 2x10 each  4in box on airex x 10   07/08/22 NuStep L5 x24mins  Walking 5 big laps in gym without AD   Leg ext 10# 2x10, RLE 5# 2x10  HS curls 25# 2x10, RLE 15# 2x10 Leg press 40# 2x10, RLE 20# x10 Step ups 6"  Step downs 4"   07/02/22 NuStep L 5 x 6 min TUG 5x S2S Gait 6 laps ~ 715ft w/ rolator Hamstring Curls 25lb 2x12, RLE 15lb 2x10  Leg Ext 10lb 2x12, RLE 5lb 2x10  Leg press 30lb 2x12 4 in box on airex step ups 2x10 each  06/30/22 NuStep L 5 x 6 min R knee PROM with end range holds, patella mobs S2S 2x10 holding yellow ball  Standing march 2x10 Hamstring Curls 25lb 2x12 Leg Ext 10lb 2x12, RLE 5lb 2x10  6in step ups x10 Lateral 4in step ups  Heel raises 2x15  06/25/22 NuStep L5 x 6 min R knee PROM with end range holds, patella mobs S2S 2x10 holding yellow wball  Resisted gait 30lb all directions x4 each, LOB x 1 Max assit to recover  4in steps up x 10 each, 6in step ups x 10 each  HS curls 25lb 2x10     PATIENT EDUCATION:  Education details: HEP and POC Person educated: Patient Education method: Explanation Education comprehension: verbalized understanding  HOME EXERCISE PROGRAM: Access Code: [email or web address] URL: [email or web address] Date: 06/16/2022 Prepared by: Cassie Freer  Exercises - Mini Squat with Counter Support  - 1 x daily - 7 x weekly - 3 sets - 10 reps - Standing Hip Abduction with Unilateral Counter Support  - 1 x daily - 7 x weekly - 3 sets -  10 reps - Standing March with Unilateral Counter Support  - 1 x daily - 7 x weekly - 3 sets - 10 reps - Heel Raises with Counter Support  - 1 x daily - 7 x weekly - 3 sets - 10 reps - Sit to Stand  - 1 x daily - 7 x weekly - 3 sets - 10 reps - Seated Hamstring Stretch  - 1 x daily - 7 x weekly - 2 reps - 30 hold  ASSESSMENT:  CLINICAL IMPRESSION: Patient was seen today for physical therapy treatment for R total knee revision. She continues to do well with walking today, mataining  normal gait mechanics today without AD.  Still has some limitations with ROM, despite improving with extension. Does well on machines today and with isolated RLE strengthening.   OBJECTIVE IMPAIRMENTS: Abnormal gait, decreased mobility, difficulty walking, decreased ROM, decreased strength, and pain.   REHAB POTENTIAL: Good  CLINICAL DECISION MAKING: Stable/uncomplicated  EVALUATION COMPLEXITY: Low   GOALS: Goals reviewed with patient? Yes  SHORT TERM GOALS: Target date: 07/28/22  Patient will be independent with initial HEP. Goal status: Progressing 06/30/22  2.  Patient will demonstrate 5xSTS in < 13s  Baseline: 17.21s Goal status: Met 11.86 07/02/22    LONG TERM GOALS: Target date: 09/01/22  Patient will be independent with advanced/ongoing HEP to improve outcomes and carryover.  Goal status: INITIAL  2.  Patient will report at least 75% improvement in R knee pain to improve QOL. Baseline: 7/10 at worst Goal status:  Progressing 50% 07/02/22  3.  Patient will demonstrate improved R knee AROM to >/= 0-120 deg to allow for normal gait and stair mechanics. Baseline: 18-0-95 Goal status: Progressing 07/10/22  4.  Patient will demonstrate improved functional LE strength as demonstrated by 5/5. Baseline: 3+, 5/5 07/08/22 Goal status: MET  5.  Patient will be able to ambulate 600' with LRAD and normal gait pattern without increased pain to access community.  Baseline: using rollator walking Goal  status: Met 07/02/22  6. Patient will be able to do TUG <12s.  Baseline: 15.82s Goal status: Met 9.35 07/02/22  7.  Patient will report 84 on FOTO (patient reported outcome measure) to demonstrate improved functional ability. Baseline: 51 Goal status: INITIAL   PLAN:  PT FREQUENCY: 2x/week  PT DURATION: 10 weeks  PLANNED INTERVENTIONS: Therapeutic exercises, Therapeutic activity, Neuromuscular re-education, Balance training, Gait training, Patient/Family education, Self Care, Joint mobilization, Stair training, Dry Needling, Cryotherapy, Moist heat, Vasopneumatic device, Ionotophoresis 4mg /ml Dexamethasone, and Manual therapy  PLAN FOR NEXT SESSION: start gym activities for R knee, work on ROM and strengthening    Grayce Sessions, PTA 07/10/2022, 7:55 AM

## 2022-07-13 ENCOUNTER — Other Ambulatory Visit: Payer: Medicaid Other

## 2022-07-13 NOTE — Patient Outreach (Signed)
Medicaid Managed Care Social Work Note  07/13/2022 Name:  Stacy [name] MRN:  [id number] DOB:  12-Nov-1960  Stacy [name] is an 62 y.o. year old female who is a primary patient of Marcine Matar, MD.  The Medicaid Managed Care Coordination team was consulted for assistance with:  Community Resources   Stacy [name] was given information about Medicaid Managed Care Coordination team services today. Stacy [name] Patient agreed to services and verbal consent obtained.  Engaged with patient  for by telephone forfollow up visit in response to referral for case management and/or care coordination services.   Assessments/Interventions:  Review of past medical history, allergies, medications, health status, including review of consultants reports, laboratory and other test data, was performed as part of comprehensive evaluation and provision of chronic care management services.  SDOH: (Social Determinant of Health) assessments and interventions performed: SDOH Interventions    Flowsheet Row Patient Outreach Telephone from 07/03/2022 in [place] POPULATION [facility] DEPARTMENT Patient Outreach Telephone from 05/20/2022 in [place] POPULATION HEALTH DEPARTMENT Patient Outreach Telephone from 03/09/2022 in [place] POPULATION HEALTH DEPARTMENT Patient Outreach Telephone from 01/07/2022 in [place] POPULATION HEALTH DEPARTMENT Patient Outreach Telephone from 10/21/2021 in [facility] Care Coordination  SDOH Interventions       Food Insecurity Interventions -- -- Intervention Not Indicated -- --  Housing Interventions -- -- -- -- Intervention Not Indicated  Transportation Interventions Intervention Not Indicated Payor Benefit Intervention Not Indicated -- Intervention Not Indicated  Alcohol Usage Interventions -- -- -- Intervention Not Indicated (Score <7) --     BSW completed a telephone outreach with patient. She states everything is going well. She  had a good mothers day with her grandchildren and has a great grandchild on the way. She still has not receieved her food bag from [employer]. BSW sent another email to check on the status of the referral. No other resources are needed at this time.  Advanced Directives Status:  Not addressed in this encounter.  Care Plan                 Allergies  Allergen Reactions   Other Shortness Of Breath    UNSPECIFIED AGENTS Allergic to perfumes and cleaning products   Shellfish Allergy Anaphylaxis    Per allergy test.   Ace Inhibitors Cough and Other (See Comments)        Celebrex [Celecoxib]      upset stomach  Pt is taking med*   Shellfish-Derived Products Other (See Comments)   Aspirin Nausea Only and Other (See Comments)    stomach upset  Other reaction(s): Nausea Only    Medications Reviewed Today     Reviewed by Teresa Pelton, PTA (Physical Therapy Assistant) on 07/10/22 at [phone]  Med List Status: <None>   Medication Order Taking? Sig Documenting Provider Last Dose Status Informant  albuterol (PROVENTIL) (2.5 MG/3ML) 0.083% nebulizer solution [id number] No Use as directed up to 3 times daily Georgian Co M, PA-C Taking Active   allopurinol (ZYLOPRIM) 100 MG tablet [id number] No TAKE 1 TABLET (100 MG TOTAL) BY MOUTH DAILY.(AM) Anders Simmonds, PA-C Taking Active   atorvastatin (LIPITOR) 40 MG tablet [id number] No TAKE 1 TABLET (40 MG TOTAL) BY MOUTH DAILY. (BEDTIME) Marcine Matar, MD Taking Active Self, Pharmacy Records  Blood Glucose Monitoring Suppl (ACCU-CHEK AVIVA PLUS) w/Device KIT [id number] No 1 each by Does not apply route 3 (three) times daily. Anders Simmonds, PA-C Taking Active Self,  Pharmacy Records  celecoxib (CELEBREX) 200 MG capsule [id number] No Take 200 mg by mouth 2 (two) times daily. [provider] Taking Active Self, Pharmacy Records  clopidogrel (PLAVIX) 75 MG tablet [id number] No Take 1 tablet (75 mg total) by mouth daily.  Georgian Co M, PA-C Taking Active   dapagliflozin propanediol (FARXIGA) 5 MG TABS tablet [id number] No Take 1 tablet (5 mg total) by mouth daily before breakfast. Anders Simmonds, PA-C Taking Active   diclofenac Sodium (VOLTAREN) 1 % GEL [id number] No Apply 2 g topically 4 (four) times daily.  Patient taking differently: Apply 2 g topically 4 (four) times daily as needed (joint pain).   Kerrin Champagne, MD Taking Active Self, Pharmacy Records  DULoxetine (CYMBALTA) 60 MG capsule [id number] No TAKE 1 CAPSULE (60 MG TOTAL) BY MOUTH DAILY (AM) Anders Simmonds, PA-C Taking Active   EPINEPHrine 0.3 mg/0.3 mL IJ SOAJ injection [id number] No 0.3 mg IM x 1 PRN for allergic reaction Marcine Matar, MD Taking Active Self, Pharmacy Records  ferrous sulfate ([name]) 325 (65 FE) MG tablet [id number] No TAKE ONE TABLET ( 325 MG ) BY MOUTH DAILY (AM) Anders Simmonds, PA-C Taking Active   fluticasone (FLONASE) 50 MCG/ACT nasal spray [id number] No Place 1 spray into both nostrils daily. Anders Simmonds, [place] Taking Active   Discontinued 04/16/11 0943 Glucagon 1 MG/0.2ML SOSY [id number] No 1 mg arrange subcutaneously as needed for severe symptomatic low blood sugar Marcine Matar, MD Taking Active Self, Pharmacy Records  glucose blood (ACCU-CHEK AVIVA PLUS) test strip [id number] No Use as instructed Marcine Matar, MD Taking Active Self, Pharmacy Records  glucose chewable tablet 12 g [id number]   Marcine Matar, MD  Active   glycopyrrolate (ROBINUL) 2 MG tablet [id number] No Take 2 mg by mouth 3 (three) times daily. [provider] Taking Active Self, Pharmacy Records  hydrOXYzine (ATARAX) 10 MG tablet [id number] No TAKE 1 TABLET IN THE MORNING AND NOON AND 2 TABLETS IN THE EVENING AS NEEDED. Anders Simmonds, PA-C Taking Active   Lancets (ACCU-CHEK MULTICLIX) lancets [id number] No Use as instructed Marcine Matar, MD Taking Active Self, Pharmacy Records  Lancets [facility] SOFT [facility])  lancets [id number] No Use as instructed Anders Simmonds, PA-C Taking Active Self, Pharmacy Records  Discontinued 04/16/11 [phone] (Error) loratadine (CLARITIN) 10 MG tablet [id number] No TAKE 1 TABLET (10 MG TOTAL) BY MOUTH DAILY. (AM) Marcine Matar, MD Taking Active Self, Pharmacy Records  melatonin 5 MG TABS [id number] No Take 5 mg by mouth at bedtime. [provider] Taking Active Self, Pharmacy Records  metFORMIN (GLUCOPHAGE-XR) 750 MG 24 hr tablet [id number] No Take 1 tablet (750 mg total) by mouth daily with breakfast. Anders Simmonds, PA-C Taking Active   methocarbamol (ROBAXIN) 500 MG tablet [id number] No TAKE 1 TABLET (500 MG TOTAL) BY MOUTH EVERY 6 (SIX) HOURS AS NEEDED FOR MUSCLE SPASMS. Kirtland Bouchard, PA-C Taking Active   Methylnaltrexone Bromide 8 MG/0.4ML SOLN [id number] No Take 0.4 mLs by mouth as needed. Take once daily as needed Curley Spice, MD Taking Active   montelukast (SINGULAIR) 10 MG tablet [id number] No Take 10 mg by mouth daily. [provider] Taking Active Self, Pharmacy Records  Multiple Vitamins-Minerals (CENTRUM SILVER 50+WOMEN PO) [id number] No Take 1 tablet by mouth daily. [provider] Taking Active Self, Pharmacy Records  pantoprazole (PROTONIX) 40 MG tablet [id number] No Take 40 mg by mouth daily. [provider] Taking Active Self, Pharmacy Records  potassium chloride SA (KLOR-CON M) 20 MEQ tablet [id number] No Take 1 tablet (20 mEq total) by mouth daily. Anders Simmonds, PA-C Taking Active   Respiratory Therapy Supplies (FLUTTER) DEVI [id number] No Use after breathing treatment 4 times daily Storm Frisk, MD Taking Active Self, Pharmacy Records  [facility] [place] 160-4.5 MCG/ACT inhaler [id number] No Inhale 1 puff into the lungs 2 (two) times daily. [provider] Taking Active Self, Pharmacy Records  tiotropium (SPIRIVA HANDIHALER) 18 MCG inhalation capsule [id number] No Place 1 capsule (18 mcg total) into inhaler and  inhale daily. Georgian Co M, [place] Taking Active   triamcinolone cream (KENALOG) 0.1 % [id number] No Apply 1 Application topically 2 (two) times daily as needed (irritation). Anders Simmonds, [place] Taking Active   valsartan-hydrochlorothiazide (DIOVAN-HCT) 160-12.5 MG tablet [id number] No TAKE ONE TABLET BY MOUTH ONCE DAILY (AM) Anders Simmonds, PA-C Taking Active             Patient Active Problem List   Diagnosis Date Noted   Status post revision of total replacement of right knee 05/12/2022   Personal history of colonic polyps 03/20/2022   Failed total knee, right, subsequent encounter 03/02/2022   Loose right total knee arthroplasty (HCC) 03/02/2022   Mixed stress and urge urinary incontinence 12/04/2021   Functional fecal incontinence 12/04/2021   IBS (irritable bowel syndrome) 11/19/2021   Spondylolisthesis, lumbar region    Other spondylosis with radiculopathy, lumbar region    Other secondary scoliosis, lumbar region    Fusion of spine of lumbar region 03/25/2021   Colon polyps 06/06/2020   Lupus (HCC) 06/06/2020   Allergic rhinitis due to animal (cat) (dog) hair and dander 04/08/2020   Allergic rhinitis due to pollen 04/08/2020   Food allergy 04/08/2020   Acute medial meniscus tear, left, subsequent encounter 03/21/2020   Chronic pain of left knee 02/15/2020   Paresthesia of skin 11/16/2019   History of total knee replacement, right 11/16/2019   Tobacco abuse 11/16/2019   Centrilobular emphysema (HCC) 08/10/2019   Incidental lung nodule, > 3mm and < 8mm 08/10/2019   OSA on CPAP 08/10/2019   Dyspnea on exertion 08/02/2019   Hyperlipidemia 08/02/2019   Lumbar radiculopathy 04/24/2019   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   Post laminectomy syndrome 02/23/2019   Abnormality of gait 02/23/2019   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   Unilateral primary osteoarthritis, left hip 12/28/2018   Lesion of skin of left ear 12/26/2018   Primary osteoarthritis of  left hip 12/02/2018   Iron deficiency anemia 10/16/2018   Chronic pain syndrome 09/08/2018   Chronic pain of right knee 08/11/2018   Status post lumbar laminectomy 07/15/2018   Peripheral arterial disease (HCC) 04/05/2018   Moderate persistent asthma without complication 06/29/2017   Environmental and seasonal allergies 06/29/2017   Controlled type 2 diabetes mellitus with diabetic polyneuropathy, without long-term current use of insulin (HCC) 06/29/2017   Perennial allergic rhinitis 04/08/2017   Sensorineural hearing loss (SNHL), bilateral 04/08/2017   Chronic pansinusitis 03/25/2017   Eustachian tube dysfunction, bilateral 03/25/2017   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   Herniation of lumbar intervertebral disc with radiculopathy 10/02/2016    Class: Chronic   Alopecia areata 08/19/2016   Chondromalacia of both patellae 06/03/2015    Class: Chronic   Spinal stenosis, lumbar region, with neurogenic claudication 06/03/2015   Tobacco use disorder 04/25/2015   DJD (degenerative joint disease) of knee 01/04/2015   Hemorrhoid 11/14/2014   Gout of big toe 07/19/2014   Essential  hypertension 08/14/2013   Gastroesophageal reflux disease without esophagitis 08/14/2013   COPD (chronic obstructive pulmonary disease) (HCC) 04/17/2011    Conditions to be addressed/monitored per PCP order:   food resources  There are no care plans that you recently modified to display for this patient.   Follow up:  Patient agrees to Care Plan and Follow-up.  Plan: The  Patient has been provided with contact information for the Managed Medicaid care management team and has been advised to call with any health related questions or concerns.    Abelino Derrick, MHA [facility]  Managed [facility] Social Worker [phone]

## 2022-07-13 NOTE — Patient Instructions (Signed)
Visit Information  [name] was given information about Medicaid Managed Care team care coordination services as a part of their [facility] Medicaid benefit. Stacy [name] verbally consented to engagement with the [facility] Managed Care team.   If you are experiencing a medical emergency, please call 911 or report to your local emergency department or urgent care.   If you have a non-emergency medical problem during routine business hours, please contact your provider's office and ask to speak with a nurse.   For questions related to your [facility], please call: [phone] or visit the homepage here: [email or web address]  If you would like to schedule transportation through your [facility], please call the following number at least 2 days in advance of your appointment: [phone]   Rides for urgent appointments can also be made after hours by calling Member Services.  Call the Behavioral Health Crisis Line at [phone], at any time, 24 hours a day, 7 days a week. If you are in danger or need immediate medical attention call 911.  If you would like help to quit smoking, call 1-800-QUIT-NOW (6713533523) OR Espaol: 1-855-Djelo-Ya (6-387-564-3329) o para ms informacin haga clic aqu or Text READY to 518-841 to register via text  Ms. Cicero - following are the goals we discussed in your visit today:   Goals Addressed   None     The  Patient                                              has been provided with contact information for the Managed Medicaid care management team and has been advised to call with any health related questions or concerns.   Stacy [name], [name], MHA [facility]  Managed Medicaid Social Worker [phone]   Following is a copy of your plan of care:  There are no care plans that you recently modified to display  for this patient.

## 2022-07-14 ENCOUNTER — Ambulatory Visit: Payer: Medicaid Other | Admitting: Physical Therapy

## 2022-07-14 ENCOUNTER — Encounter: Payer: Self-pay | Admitting: Physical Therapy

## 2022-07-14 DIAGNOSIS — R296 Repeated falls: Secondary | ICD-10-CM

## 2022-07-14 DIAGNOSIS — M25561 Pain in right knee: Secondary | ICD-10-CM | POA: Diagnosis not present

## 2022-07-14 DIAGNOSIS — M6281 Muscle weakness (generalized): Secondary | ICD-10-CM

## 2022-07-14 DIAGNOSIS — R262 Difficulty in walking, not elsewhere classified: Secondary | ICD-10-CM

## 2022-07-14 DIAGNOSIS — M25661 Stiffness of right knee, not elsewhere classified: Secondary | ICD-10-CM

## 2022-07-14 NOTE — Therapy (Signed)
OUTPATIENT PHYSICAL THERAPY LOWER EXTREMITY TREATMENT   Patient Name: Stacy [name] [facility] MRN: [id number] DOB:[birth year], 62 y.o., female Today's Date: 07/14/2022  END OF SESSION:  PT End of Session - 07/14/22 0757     Visit Number 9    Date for PT Re-Evaluation 09/01/22    Authorization Type [place] [employer]    PT Start Time 0757    PT Stop Time 0841    PT Time Calculation (min) 44 min    Activity Tolerance Patient tolerated treatment well    Behavior During Therapy WFL for tasks assessed/performed             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   Asthma    COPD (chronic obstructive pulmonary disease) (HCC)    Depression    Diabetes mellitus    Type II   GERD (gastroesophageal reflux disease)    Pt on Protonix daily   Glaucoma    Gout    Headache(784.0)    otc meds prn   Hyperlipidemia    Hypertension    Irritable bowel syndrome 11/19/2010   Neuropathy    Pneumonia YRS AGO   Restless legs    Shortness of breath    07/14/2018- uses  4 times a day   Sleep apnea    Past Surgical History:  Procedure Laterality Date   BACK SURGERY     feb 21, 23   CHOLECYSTECTOMY     COLONOSCOPY     ENDOMETRIAL ABLATION  10/2010   HERNIA REPAIR     umbicial hernia   JOINT REPLACEMENT Left 03/14/2019   Dr. Magnus Ivan hip   KNEE ARTHROSCOPY Left    06/07/2017 Dr. August [facility]   KNEE ARTHROSCOPY Left 03/21/2020   Procedure: LEFT KNEE ARTHROSCOPY WITH PARTIAL MEDIAL MENISCECTOMY;  Surgeon: [name], MD;  Location: [facility];  Service: Orthopedics;  Laterality: Left;   KNEE CLOSED REDUCTION Right 12/06/2015   Procedure: CLOSED MANIPULATION RIGHT KNEE;  Surgeon: Kerrin Champagne, MD;  Location: [facility];  Service: Orthopedics;  Laterality: Right;   KNEE CLOSED REDUCTION Right 01/17/2016   Procedure: CLOSED MANIPULATION  RIGHT KNEE;  Surgeon: Kerrin Champagne, MD;  Location: [facility];  Service: Orthopedics;  Laterality: Right;   KNEE JOINT MANIPULATION Right 12/06/2015   LACRIMAL TUBE INSERTION Bilateral 03/01/2019   Procedure: LACRIMAL TUBE INSERTION;  Surgeon: Aura Camps, MD;  Location[facility];  Service: Ophthalmology;  Laterality: Bilateral;   LACRIMAL TUBE REMOVAL Bilateral 05/03/2019   Procedure: BILATERAL NASOLACRIMAL DUCT PROBING, IIRIGATION AND TUBE REMOVAL BOTH EYES;  Surgeon: Aura Camps, MD;  Location: [facility];  Service: Ophthalmology;  Laterality: Bilateral;   LUMBAR LAMINECTOMY/DECOMPRESSION MICRODISCECTOMY N/A 06/03/2015   Procedure: Bilateral lateral recess decompression L2-3, L3-4, L4-5;  Surgeon: Kerrin Champagne, MD;  Location: [facility];  Service: Orthopedics;  Laterality: N/A;   LUMBAR LAMINECTOMY/DECOMPRESSION MICRODISCECTOMY N/A 10/02/2016   Procedure: Right L5-S1 Lateral Recess Decompression  microdiscectomy;  Surgeon: Kerrin Champagne, MD;  Location: [facility];  Service: Orthopedics;  Laterality: N/A;   LUMBAR LAMINECTOMY/DECOMPRESSION MICRODISCECTOMY N/A 07/15/2018   Procedure: LEFT L3-4 MICRODISCECTOMY;  Surgeon: Kerrin Champagne, MD;  Location: [facility] OR;  Service: Orthopedics;  Laterality: N/A;   svd      x 2   TEAR DUCT PROBING Bilateral  03/01/2019   Procedure: TEAR DUCT PROBING WITH IRRIGATION;  Surgeon: Aura Camps, MD;  Location: [facility];  Service: Ophthalmology;  Laterality: Bilateral;   TOTAL HIP ARTHROPLASTY Left 03/14/2019   Procedure: LEFT TOTAL HIP ARTHROPLASTY ANTERIOR APPROACH;  Surgeon: [name], MD;  Location: [facility];  Service: Orthopedics;  Laterality: Left;   TOTAL KNEE ARTHROPLASTY Right 09/06/2015   Procedure: RIGHT TOTAL KNEE ARTHROPLASTY;  Surgeon: Kerrin Champagne, MD;  Location: [facility];  Service: Orthopedics;  Laterality: Right;   TOTAL KNEE REVISION Right 05/12/2022   Procedure: RIGHT TOTAL KNEE REVISION  ARTHROPLASTY;  Surgeon: [name], MD;  Location: [facility];  Service: Orthopedics;  Laterality: Right;   TUBAL LIGATION     UPPER GASTROINTESTINAL ENDOSCOPY  04/28/2011   Patient Active Problem List   Diagnosis Date Noted   Status post revision of total replacement of right knee 05/12/2022   Personal history of colonic polyps 03/20/2022   Failed total knee, right, subsequent encounter 03/02/2022   Loose right total knee arthroplasty (HCC) 03/02/2022   Mixed stress and urge urinary incontinence 12/04/2021   Functional fecal incontinence 12/04/2021   IBS (irritable bowel syndrome) 11/19/2021   Spondylolisthesis, lumbar region    Other spondylosis with radiculopathy, lumbar region    Other secondary scoliosis, lumbar region    Fusion of spine of lumbar region 03/25/2021   Colon polyps 06/06/2020   Lupus (HCC) 06/06/2020   Allergic rhinitis due to animal (cat) (dog) hair and dander 04/08/2020   Allergic rhinitis due to pollen 04/08/2020   Food allergy 04/08/2020   Acute medial meniscus tear, left, subsequent encounter 03/21/2020   Chronic pain of left knee 02/15/2020   Paresthesia of skin 11/16/2019   History of total knee replacement, right 11/16/2019   Tobacco abuse 11/16/2019   Centrilobular emphysema (HCC) 08/10/2019   Incidental lung nodule, > 3mm and < 8mm 08/10/2019   OSA on CPAP 08/10/2019   Dyspnea on exertion 08/02/2019   Hyperlipidemia 08/02/2019   Lumbar radiculopathy 04/24/2019   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   Post laminectomy syndrome 02/23/2019   Abnormality of gait 02/23/2019   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   Unilateral primary osteoarthritis, left hip 12/28/2018   Lesion of skin of left ear 12/26/2018   Primary osteoarthritis of left hip 12/02/2018   Iron deficiency anemia 10/16/2018   Chronic pain syndrome 09/08/2018   Chronic pain of right knee 08/11/2018   Status post lumbar laminectomy 07/15/2018   Peripheral arterial disease  (HCC) 04/05/2018   Moderate persistent asthma without complication 06/29/2017   Environmental and seasonal allergies 06/29/2017   Controlled type 2 diabetes mellitus with diabetic polyneuropathy, without long-term current use of insulin (HCC) 06/29/2017   Perennial allergic rhinitis 04/08/2017   Sensorineural hearing loss (SNHL), bilateral 04/08/2017   Chronic pansinusitis 03/25/2017   Eustachian tube dysfunction, bilateral 03/25/2017   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   Herniation of lumbar intervertebral disc with radiculopathy 10/02/2016    Class: Chronic   Alopecia areata 08/19/2016   Chondromalacia of both patellae 06/03/2015    Class: Chronic   Spinal stenosis, lumbar region, with neurogenic claudication 06/03/2015   Tobacco use disorder 04/25/2015   DJD (degenerative joint disease) of knee 01/04/2015   Hemorrhoid 11/14/2014   Gout of big toe 07/19/2014   Essential hypertension 08/14/2013   Gastroesophageal reflux disease without esophagitis 08/14/2013   COPD (chronic obstructive pulmonary disease) (HCC) 04/17/2011    PCP: Jonah Blue  REFERRING PROVIDER: Thayer Ohm  Magnus Ivan  REFERRING DIAG: [phone] (ICD-10-CM) - Status post total right knee replacement   THERAPY DIAG:  Acute pain of right knee  Muscle weakness (generalized)  Stiffness of right knee, not elsewhere classified  Difficulty in walking, not elsewhere classified  Repeated falls  Rationale for Evaluation and Treatment: Rehabilitation  ONSET DATE: 05/12/22  SUBJECTIVE:   SUBJECTIVE STATEMENT: "I feel good"  PERTINENT HISTORY: L hip replacement, L knee scope, R knee replacement and reduction now revision  PAIN:  Are you having pain? Yes: NPRS scale: 0/10 Pain location: R knee Pain description: throbbing, aching  Aggravating factors: standing or walking too long Relieving factors: ice, pain meds   PRECAUTIONS: None  WEIGHT BEARING RESTRICTIONS: No  FALLS:  Has patient fallen in last 6 months?  Yes. Number of falls 5, knees buckle on me   LIVING ENVIRONMENT: Lives with: lives with their spouse Lives in: House/apartment Stairs: Yes: Internal: 12 steps; on left going up Has following equipment at home: Single point cane, Walker - 2 wheeled, and Environmental consultant - 4 wheeled  OCCUPATION: Retired  PLOF: Independent with basic ADLs and Independent with household mobility with device  PATIENT GOALS: getting better and be able to move around better   NEXT MD VISIT: 06/24/22  OBJECTIVE:   DIAGNOSTIC FINDINGS: FINDINGS: Postsurgical changes of right knee arthroplasty revision with constrained long-stem arthroplasty. No evidence of loosening or fracture. Expected soft tissue changes.   IMPRESSION: Postsurgical changes of right knee arthroplasty revision. No evidence of immediate hardware complication  PATIENT SURVEYS:  FOTO 51  COGNITION: Overall cognitive status: Within functional limits for tasks assessed     SENSATION: WFL   MUSCLE LENGTH: Hamstrings: bilateral tightness  POSTURE: rounded shoulders   LOWER EXTREMITY ROM:  Active ROM Right eval Right 06/30/22 Right  07/10/22  Hip flexion     Hip extension     Hip abduction     Hip adduction     Hip internal rotation     Hip external rotation     Knee flexion 95 105 105  Knee extension -18 11 7   Ankle dorsiflexion     Ankle plantarflexion     Ankle inversion     Ankle eversion      (Blank rows = not tested)  LOWER EXTREMITY MMT:  MMT Right eval Right  07/08/22  Hip flexion 3+ 5  Hip extension    Hip abduction    Hip adduction    Hip internal rotation    Hip external rotation    Knee flexion 3+ 5  Knee extension 3+  5  Ankle dorsiflexion    Ankle plantarflexion    Ankle inversion    Ankle eversion     (Blank rows = not tested)   FUNCTIONAL TESTS:  5 times sit to stand: 17.21s Timed up and go (TUG): 15.82s  GAIT: Distance walked: in clinic distances Assistive device utilized: Environmental consultant - 4 wheeled and  None Level of assistance: Complete Independence and Modified independence Comments: able to complete TUG without AD, uses rollator on a regular basis    TODAY'S TREATMENT:  DATE:  07/14/22 NuStep L5 x LE only  4in box on airex forward & Lateral step ups x10 each HS curls 25lb 2x12, RLE 15lb 2x10 Leg Ext 10lb 2x12, Single leg 5lb 2x10 each  S2S holding blue ball 2x12 R knee PROM with end range holds Leg press 40lb 2x12   07/10/22 NuStep L5 x LE only  Walking 5 big laps in gym without AD   S2S LE on airex holding yellow ball 2x12 R knee PROM flex & Ext  HS curls 25lb 2x12, RLE 15lb 2x10 Leg Ext 10lb 2x12, Single leg 5lb 2x10 each  4in box on airex x 10   07/08/22 NuStep L5 x64mins  Walking 5 big laps in gym without AD   Leg ext 10# 2x10, RLE 5# 2x10  HS curls 25# 2x10, RLE 15# 2x10 Leg press 40# 2x10, RLE 20# x10 Step ups 6"  Step downs 4"   07/02/22 NuStep L 5 x 6 min TUG 5x S2S Gait 6 laps ~ 726ft w/ rolator Hamstring Curls 25lb 2x12, RLE 15lb 2x10  Leg Ext 10lb 2x12, RLE 5lb 2x10  Leg press 30lb 2x12 4 in box on airex step ups 2x10 each  06/30/22 NuStep L 5 x 6 min R knee PROM with end range holds, patella mobs S2S 2x10 holding yellow ball  Standing march 2x10 Hamstring Curls 25lb 2x12 Leg Ext 10lb 2x12, RLE 5lb 2x10  6in step ups x10 Lateral 4in step ups  Heel raises 2x15  06/25/22 NuStep L5 x 6 min R knee PROM with end range holds, patella mobs S2S 2x10 holding yellow wball  Resisted gait 30lb all directions x4 each, LOB x 1 Max assit to recover  4in steps up x 10 each, 6in step ups x 10 each  HS curls 25lb 2x10     PATIENT EDUCATION:  Education details: HEP and POC Person educated: Patient Education method: Explanation Education comprehension: verbalized understanding  HOME EXERCISE PROGRAM: Access Code:  [email or web address] URL: [email or web address] Date: 06/16/2022 Prepared by: Cassie Freer  Exercises - Mini Squat with Counter Support  - 1 x daily - 7 x weekly - 3 sets - 10 reps - Standing Hip Abduction with Unilateral Counter Support  - 1 x daily - 7 x weekly - 3 sets - 10 reps - Standing March with Unilateral Counter Support  - 1 x daily - 7 x weekly - 3 sets - 10 reps - Heel Raises with Counter Support  - 1 x daily - 7 x weekly - 3 sets - 10 reps - Sit to Stand  - 1 x daily - 7 x weekly - 3 sets - 10 reps - Seated Hamstring Stretch  - 1 x daily - 7 x weekly - 2 reps - 30 hold  ASSESSMENT:  CLINICAL IMPRESSION: Patient was seen today for physical therapy treatment for R total knee revision. She continues to do well with  functional interventions. Some fatigue reported with S2S. Still has some limitations with R knee extension.  Does well on machines today and with isolated RLE strengthening.   OBJECTIVE IMPAIRMENTS: Abnormal gait, decreased mobility, difficulty walking, decreased ROM, decreased strength, and pain.   REHAB POTENTIAL: Good  CLINICAL DECISION MAKING: Stable/uncomplicated  EVALUATION COMPLEXITY: Low   GOALS: Goals reviewed with patient? Yes  SHORT TERM GOALS: Target date: 07/28/22  Patient will be independent with initial HEP. Goal status: Progressing 06/30/22  2.  Patient will demonstrate 5xSTS in < 13s  Baseline: 17.21s Goal status: Met 11.86  07/02/22    LONG TERM GOALS: Target date: 09/01/22  Patient will be independent with advanced/ongoing HEP to improve outcomes and carryover.  Goal status: INITIAL  2.  Patient will report at least 75% improvement in R knee pain to improve QOL. Baseline: 7/10 at worst Goal status: Progressing 50% 07/02/22  3.  Patient will demonstrate improved R knee AROM to >/= 0-120 deg to allow for normal gait and stair mechanics. Baseline: 18-0-95 Goal status: Progressing 07/10/22  4.  Patient will demonstrate improved  functional LE strength as demonstrated by 5/5. Baseline: 3+, 5/5 07/08/22 Goal status: MET  5.  Patient will be able to ambulate 600' with LRAD and normal gait pattern without increased pain to access community.  Baseline: using rollator walking Goal status: Met 07/02/22  6. Patient will be able to do TUG <12s.  Baseline: 15.82s Goal status: Met 9.35 07/02/22  7.  Patient will report 60 on FOTO (patient reported outcome measure) to demonstrate improved functional ability. Baseline: 51 Goal status: INITIAL   PLAN:  PT FREQUENCY: 2x/week  PT DURATION: 10 weeks  PLANNED INTERVENTIONS: Therapeutic exercises, Therapeutic activity, Neuromuscular re-education, Balance training, Gait training, Patient/Family education, Self Care, Joint mobilization, Stair training, Dry Needling, Cryotherapy, Moist heat, Vasopneumatic device, Ionotophoresis 4mg /ml Dexamethasone, and Manual therapy  PLAN FOR NEXT SESSION: start gym activities for R knee, work on ROM and strengthening    Grayce Sessions, PTA 07/14/2022, 7:57 AM

## 2022-07-15 DIAGNOSIS — F331 Major depressive disorder, recurrent, moderate: Secondary | ICD-10-CM | POA: Diagnosis not present

## 2022-07-16 ENCOUNTER — Ambulatory Visit: Payer: Medicaid Other | Admitting: Physical Therapy

## 2022-07-16 ENCOUNTER — Encounter: Payer: Self-pay | Admitting: Physical Therapy

## 2022-07-16 DIAGNOSIS — R262 Difficulty in walking, not elsewhere classified: Secondary | ICD-10-CM

## 2022-07-16 DIAGNOSIS — R296 Repeated falls: Secondary | ICD-10-CM

## 2022-07-16 DIAGNOSIS — M25561 Pain in right knee: Secondary | ICD-10-CM | POA: Diagnosis not present

## 2022-07-16 DIAGNOSIS — M25661 Stiffness of right knee, not elsewhere classified: Secondary | ICD-10-CM

## 2022-07-16 DIAGNOSIS — M6281 Muscle weakness (generalized): Secondary | ICD-10-CM

## 2022-07-16 NOTE — Therapy (Signed)
OUTPATIENT PHYSICAL THERAPY LOWER EXTREMITY TREATMENT   Patient Name: Stacy [name] [facility] MRN: [id number] DOB:[birth year], 62 y.o., female Today's Date: 07/16/2022  END OF SESSION:  PT End of Session - 07/16/22 0758     Visit Number 10    Authorization Type [place] [employer]    PT Start Time 0755    PT Stop Time 0840    PT Time Calculation (min) 45 min    Activity Tolerance Patient tolerated treatment well    Behavior During Therapy WFL for tasks assessed/performed             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   Asthma    COPD (chronic obstructive pulmonary disease) (HCC)    Depression    Diabetes mellitus    Type II   GERD (gastroesophageal reflux disease)    Pt on Protonix daily   Glaucoma    Gout    Headache(784.0)    otc meds prn   Hyperlipidemia    Hypertension    Irritable bowel syndrome 11/19/2010   Neuropathy    Pneumonia YRS AGO   Restless legs    Shortness of breath    07/14/2018- uses  4 times a day   Sleep apnea    Past Surgical History:  Procedure Laterality Date   BACK SURGERY     feb 21, 23   CHOLECYSTECTOMY     COLONOSCOPY     ENDOMETRIAL ABLATION  10/2010   HERNIA REPAIR     umbicial hernia   JOINT REPLACEMENT Left 03/14/2019   Dr. Magnus Ivan hip   KNEE ARTHROSCOPY Left    06/07/2017 Dr. August [facility]   KNEE ARTHROSCOPY Left 03/21/2020   Procedure: LEFT KNEE ARTHROSCOPY WITH PARTIAL MEDIAL MENISCECTOMY;  Surgeon: [name], MD;  Location: [facility];  Service: Orthopedics;  Laterality: Left;   KNEE CLOSED REDUCTION Right 12/06/2015   Procedure: CLOSED MANIPULATION RIGHT KNEE;  Surgeon: Kerrin Champagne, MD;  Location: [facility];  Service: Orthopedics;  Laterality: Right;   KNEE CLOSED REDUCTION Right 01/17/2016   Procedure: CLOSED MANIPULATION RIGHT KNEE;  Surgeon: Kerrin Champagne,  MD;  Location: [facility];  Service: Orthopedics;  Laterality: Right;   KNEE JOINT MANIPULATION Right 12/06/2015   LACRIMAL TUBE INSERTION Bilateral 03/01/2019   Procedure: LACRIMAL TUBE INSERTION;  Surgeon: Aura Camps, MD;  Location: [facility];  Service: Ophthalmology;  Laterality: Bilateral;   LACRIMAL TUBE REMOVAL Bilateral 05/03/2019   Procedure: BILATERAL NASOLACRIMAL DUCT PROBING, IIRIGATION AND TUBE REMOVAL BOTH EYES;  Surgeon: Aura Camps, MD;  Location: [facility];  Service: Ophthalmology;  Laterality: Bilateral;   LUMBAR LAMINECTOMY/DECOMPRESSION MICRODISCECTOMY N/A 06/03/2015   Procedure: Bilateral lateral recess decompression L2-3, L3-4, L4-5;  Surgeon: Kerrin Champagne, MD;  Location: [facility];  Service: Orthopedics;  Laterality: N/A;   LUMBAR LAMINECTOMY/DECOMPRESSION MICRODISCECTOMY N/A 10/02/2016   Procedure: Right L5-S1 Lateral Recess Decompression  microdiscectomy;  Surgeon: Kerrin Champagne, MD;  Location: [facility] OR;  Service: Orthopedics;  Laterality: N/A;   LUMBAR LAMINECTOMY/DECOMPRESSION MICRODISCECTOMY N/A 07/15/2018   Procedure: LEFT L3-4 MICRODISCECTOMY;  Surgeon: Kerrin Champagne, MD;  Location[facility];  Service: Orthopedics;  Laterality: N/A;   svd      x 2   TEAR DUCT PROBING Bilateral 03/01/2019   Procedure: TEAR DUCT PROBING WITH  IRRIGATION;  Surgeon: Aura Camps, MD;  Location: [facility];  Service: Ophthalmology;  Laterality: Bilateral;   TOTAL HIP ARTHROPLASTY Left 03/14/2019   Procedure: LEFT TOTAL HIP ARTHROPLASTY ANTERIOR APPROACH;  Surgeon: [name], MD;  Location: [facility];  Service: Orthopedics;  Laterality: Left;   TOTAL KNEE ARTHROPLASTY Right 09/06/2015   Procedure: RIGHT TOTAL KNEE ARTHROPLASTY;  Surgeon: Kerrin Champagne, MD;  Location: [facility];  Service: Orthopedics;  Laterality: Right;   TOTAL KNEE REVISION Right 05/12/2022   Procedure: RIGHT TOTAL KNEE REVISION ARTHROPLASTY;  Surgeon: [name], MD;  Location: [facility];  Service: Orthopedics;  Laterality: Right;   TUBAL LIGATION     UPPER GASTROINTESTINAL ENDOSCOPY  04/28/2011   Patient Active Problem List   Diagnosis Date Noted   Status post revision of total replacement of right knee 05/12/2022   Personal history of colonic polyps 03/20/2022   Failed total knee, right, subsequent encounter 03/02/2022   Loose right total knee arthroplasty (HCC) 03/02/2022   Mixed stress and urge urinary incontinence 12/04/2021   Functional fecal incontinence 12/04/2021   IBS (irritable bowel syndrome) 11/19/2021   Spondylolisthesis, lumbar region    Other spondylosis with radiculopathy, lumbar region    Other secondary scoliosis, lumbar region    Fusion of spine of lumbar region 03/25/2021   Colon polyps 06/06/2020   Lupus (HCC) 06/06/2020   Allergic rhinitis due to animal (cat) (dog) hair and dander 04/08/2020   Allergic rhinitis due to pollen 04/08/2020   Food allergy 04/08/2020   Acute medial meniscus tear, left, subsequent encounter 03/21/2020   Chronic pain of left knee 02/15/2020   Paresthesia of skin 11/16/2019   History of total knee replacement, right 11/16/2019   Tobacco abuse 11/16/2019   Centrilobular emphysema (HCC) 08/10/2019   Incidental lung nodule, > 3mm and < 8mm 08/10/2019   OSA on CPAP 08/10/2019   Dyspnea on exertion 08/02/2019   Hyperlipidemia 08/02/2019   Lumbar radiculopathy 04/24/2019   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   Post laminectomy syndrome 02/23/2019   Abnormality of gait 02/23/2019   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   Unilateral primary osteoarthritis, left hip 12/28/2018   Lesion of skin of left ear 12/26/2018   Primary osteoarthritis of left hip 12/02/2018   Iron deficiency anemia 10/16/2018   Chronic pain syndrome 09/08/2018   Chronic pain of right knee 08/11/2018   Status post lumbar laminectomy 07/15/2018   Peripheral arterial disease (HCC) 04/05/2018   Moderate  persistent asthma without complication 06/29/2017   Environmental and seasonal allergies 06/29/2017   Controlled type 2 diabetes mellitus with diabetic polyneuropathy, without long-term current use of insulin (HCC) 06/29/2017   Perennial allergic rhinitis 04/08/2017   Sensorineural hearing loss (SNHL), bilateral 04/08/2017   Chronic pansinusitis 03/25/2017   Eustachian tube dysfunction, bilateral 03/25/2017   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   Herniation of lumbar intervertebral disc with radiculopathy 10/02/2016    Class: Chronic   Alopecia areata 08/19/2016   Chondromalacia of both patellae 06/03/2015    Class: Chronic   Spinal stenosis, lumbar region, with neurogenic claudication 06/03/2015   Tobacco use disorder 04/25/2015   DJD (degenerative joint disease) of knee 01/04/2015   Hemorrhoid 11/14/2014   Gout of big toe 07/19/2014   Essential hypertension 08/14/2013   Gastroesophageal reflux disease without esophagitis 08/14/2013   COPD (chronic obstructive pulmonary disease) (HCC) 04/17/2011    PCP: Jonah Blue  REFERRING PROVIDER: Allie Bossier  REFERRING DIAG: [phone] (ICD-10-CM) - Status  post total right knee replacement   THERAPY DIAG:  Acute pain of right knee  Muscle weakness (generalized)  Difficulty in walking, not elsewhere classified  Repeated falls  Stiffness of right knee, not elsewhere classified  Rationale for Evaluation and Treatment: Rehabilitation  ONSET DATE: 05/12/22  SUBJECTIVE:   SUBJECTIVE STATEMENT: Doing good just some soreness   PERTINENT HISTORY: L hip replacement, L knee scope, R knee replacement and reduction now revision  PAIN:  Are you having pain? Yes: NPRS scale: 0/10 Pain location: R knee Pain description: throbbing, aching  Aggravating factors: standing or walking too long Relieving factors: ice, pain meds   PRECAUTIONS: None  WEIGHT BEARING RESTRICTIONS: No  FALLS:  Has patient fallen in last 6 months? Yes. Number of  falls 5, knees buckle on me   LIVING ENVIRONMENT: Lives with: lives with their spouse Lives in: House/apartment Stairs: Yes: Internal: 12 steps; on left going up Has following equipment at home: Single point cane, Walker - 2 wheeled, and Environmental consultant - 4 wheeled  OCCUPATION: Retired  PLOF: Independent with basic ADLs and Independent with household mobility with device  PATIENT GOALS: getting better and be able to move around better   NEXT MD VISIT: 06/24/22  OBJECTIVE:   DIAGNOSTIC FINDINGS: FINDINGS: Postsurgical changes of right knee arthroplasty revision with constrained long-stem arthroplasty. No evidence of loosening or fracture. Expected soft tissue changes.   IMPRESSION: Postsurgical changes of right knee arthroplasty revision. No evidence of immediate hardware complication  PATIENT SURVEYS:  FOTO 51  COGNITION: Overall cognitive status: Within functional limits for tasks assessed     SENSATION: WFL   MUSCLE LENGTH: Hamstrings: bilateral tightness  POSTURE: rounded shoulders   LOWER EXTREMITY ROM:  Active ROM Right eval Right 06/30/22 Right  07/10/22 Right 07/16/22  Hip flexion      Hip extension      Hip abduction      Hip adduction      Hip internal rotation      Hip external rotation      Knee flexion 95 105 105 110  Knee extension -18 11 7 3   Ankle dorsiflexion      Ankle plantarflexion      Ankle inversion      Ankle eversion       (Blank rows = not tested)  LOWER EXTREMITY MMT:  MMT Right eval Right  07/08/22  Hip flexion 3+ 5  Hip extension    Hip abduction    Hip adduction    Hip internal rotation    Hip external rotation    Knee flexion 3+ 5  Knee extension 3+  5  Ankle dorsiflexion    Ankle plantarflexion    Ankle inversion    Ankle eversion     (Blank rows = not tested)   FUNCTIONAL TESTS:  5 times sit to stand: 17.21s Timed up and go (TUG): 15.82s  GAIT: Distance walked: in clinic distances Assistive device utilized: Environmental consultant -  4 wheeled and None Level of assistance: Complete Independence and Modified independence Comments: able to complete TUG without AD, uses rollator on a regular basis    TODAY'S TREATMENT:  DATE:  07/16/22 NuStep L5 x 7 min R knee ROM with end range holds  40lb Leg press 2x12 Hamstring curls 25lb 2x12, RLE 15lb 2x10 Leg Ext 15lb 2x10, RLE 5lb 2x10 S2S holding blue ball 2x12   07/14/22 NuStep L5 x LE only  4in box on airex forward & Lateral step ups x10 each HS curls 25lb 2x12, RLE 15lb 2x10 Leg Ext 10lb 2x12, Single leg 5lb 2x10 each  S2S holding blue ball 2x12 R knee PROM with end range holds Leg press 40lb 2x12   07/10/22 NuStep L5 x LE only  Walking 5 big laps in gym without AD   S2S LE on airex holding yellow ball 2x12 R knee PROM flex & Ext  HS curls 25lb 2x12, RLE 15lb 2x10 Leg Ext 10lb 2x12, Single leg 5lb 2x10 each  4in box on airex x 10   07/08/22 NuStep L5 x58mins  Walking 5 big laps in gym without AD   Leg ext 10# 2x10, RLE 5# 2x10  HS curls 25# 2x10, RLE 15# 2x10 Leg press 40# 2x10, RLE 20# x10 Step ups 6"  Step downs 4"   07/02/22 NuStep L 5 x 6 min TUG 5x S2S Gait 6 laps ~ 754ft w/ rolator Hamstring Curls 25lb 2x12, RLE 15lb 2x10  Leg Ext 10lb 2x12, RLE 5lb 2x10  Leg press 30lb 2x12 4 in box on airex step ups 2x10 each  06/30/22 NuStep L 5 x 6 min R knee PROM with end range holds, patella mobs S2S 2x10 holding yellow ball  Standing march 2x10 Hamstring Curls 25lb 2x12 Leg Ext 10lb 2x12, RLE 5lb 2x10  6in step ups x10 Lateral 4in step ups  Heel raises 2x15  06/25/22 NuStep L5 x 6 min R knee PROM with end range holds, patella mobs S2S 2x10 holding yellow wball  Resisted gait 30lb all directions x4 each, LOB x 1 Max assit to recover  4in steps up x 10 each, 6in step ups x 10 each  HS curls 25lb 2x10      PATIENT EDUCATION:  Education details: HEP and POC Person educated: Patient Education method: Explanation Education comprehension: verbalized understanding  HOME EXERCISE PROGRAM: Access Code: [email or web address] URL: [email or web address] Date: 06/16/2022 Prepared by: Cassie Freer  Exercises - Mini Squat with Counter Support  - 1 x daily - 7 x weekly - 3 sets - 10 reps - Standing Hip Abduction with Unilateral Counter Support  - 1 x daily - 7 x weekly - 3 sets - 10 reps - Standing March with Unilateral Counter Support  - 1 x daily - 7 x weekly - 3 sets - 10 reps - Heel Raises with Counter Support  - 1 x daily - 7 x weekly - 3 sets - 10 reps - Sit to Stand  - 1 x daily - 7 x weekly - 3 sets - 10 reps - Seated Hamstring Stretch  - 1 x daily - 7 x weekly - 2 reps - 30 hold  ASSESSMENT:  CLINICAL IMPRESSION: Patient was seen today for physical therapy treatment for R total knee revision. She continues to do well with  functional interventions. Some fatigue reported with S2S. She has progressed increasing her  knee AROM in both directions. Still has some limitations with R knee extension.  Does well on machines today and with isolated RLE strengthening.   OBJECTIVE IMPAIRMENTS: Abnormal gait, decreased mobility, difficulty walking, decreased ROM, decreased strength, and pain.   REHAB POTENTIAL: Good  CLINICAL DECISION  MAKING: Stable/uncomplicated  EVALUATION COMPLEXITY: Low   GOALS: Goals reviewed with patient? Yes  SHORT TERM GOALS: Target date: 07/28/22  Patient will be independent with initial HEP. Goal status: Progressing 06/30/22  2.  Patient will demonstrate 5xSTS in < 13s  Baseline: 17.21s Goal status: Met 11.86 07/02/22    LONG TERM GOALS: Target date: 09/01/22  Patient will be independent with advanced/ongoing HEP to improve outcomes and carryover.  Goal status: INITIAL  2.  Patient will report at least 75% improvement in R knee pain to improve  QOL. Baseline: 7/10 at worst Goal status: Progressing 50% 07/02/22  3.  Patient will demonstrate improved R knee AROM to >/= 0-120 deg to allow for normal gait and stair mechanics. Baseline: 18-0-95 Goal status: Progressing 07/10/22  4.  Patient will demonstrate improved functional LE strength as demonstrated by 5/5. Baseline: 3+, 5/5 07/08/22 Goal status: MET  5.  Patient will be able to ambulate 600' with LRAD and normal gait pattern without increased pain to access community.  Baseline: using rollator walking Goal status: Met 07/02/22  6. Patient will be able to do TUG <12s.  Baseline: 15.82s Goal status: Met 9.35 07/02/22  7.  Patient will report 31 on FOTO (patient reported outcome measure) to demonstrate improved functional ability. Baseline: 51 Goal status: Progressing 61 07/16/22   PLAN:  PT FREQUENCY: 2x/week  PT DURATION: 10 weeks  PLANNED INTERVENTIONS: Therapeutic exercises, Therapeutic activity, Neuromuscular re-education, Balance training, Gait training, Patient/Family education, Self Care, Joint mobilization, Stair training, Dry Needling, Cryotherapy, Moist heat, Vasopneumatic device, Ionotophoresis 4mg /ml Dexamethasone, and Manual therapy  PLAN FOR NEXT SESSION: start gym activities for R knee, work on ROM and strengthening    Grayce Sessions, PTA 07/16/2022, 7:58 AM

## 2022-07-21 ENCOUNTER — Ambulatory Visit: Payer: Medicaid Other | Admitting: Physical Therapy

## 2022-07-21 ENCOUNTER — Encounter: Payer: Self-pay | Admitting: Physical Therapy

## 2022-07-21 DIAGNOSIS — M6281 Muscle weakness (generalized): Secondary | ICD-10-CM

## 2022-07-21 DIAGNOSIS — M25561 Pain in right knee: Secondary | ICD-10-CM

## 2022-07-21 DIAGNOSIS — R262 Difficulty in walking, not elsewhere classified: Secondary | ICD-10-CM

## 2022-07-21 DIAGNOSIS — R296 Repeated falls: Secondary | ICD-10-CM

## 2022-07-21 DIAGNOSIS — M25661 Stiffness of right knee, not elsewhere classified: Secondary | ICD-10-CM

## 2022-07-21 DIAGNOSIS — T84012D Broken internal right knee prosthesis, subsequent encounter: Secondary | ICD-10-CM | POA: Diagnosis not present

## 2022-07-21 NOTE — Therapy (Signed)
OUTPATIENT PHYSICAL THERAPY LOWER EXTREMITY TREATMENT   Patient Name: Arayna Martzall [facility] MRN: [id number] DOB:22-Jun-1960, 62 y.o., female Today's Date: 07/21/2022  END OF SESSION:  PT End of Session - 07/21/22 0757     Visit Number 11    Date for PT Re-Evaluation 09/01/22    Authorization Type [place] [employer]    PT Start Time 0755    PT Stop Time 0840    PT Time Calculation (min) 45 min    Activity Tolerance Patient tolerated treatment well    Behavior During Therapy WFL for tasks assessed/performed             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   Asthma    COPD (chronic obstructive pulmonary disease) (HCC)    Depression    Diabetes mellitus    Type II   GERD (gastroesophageal reflux disease)    Pt on Protonix daily   Glaucoma    Gout    Headache(784.0)    otc meds prn   Hyperlipidemia    Hypertension    Irritable bowel syndrome 11/19/2010   Neuropathy    Pneumonia YRS AGO   Restless legs    Shortness of breath    07/14/2018- uses  4 times a day   Sleep apnea    Past Surgical History:  Procedure Laterality Date   BACK SURGERY     feb 21, 23   CHOLECYSTECTOMY     COLONOSCOPY     ENDOMETRIAL ABLATION  10/2010   HERNIA REPAIR     umbicial hernia   JOINT REPLACEMENT Left 03/14/2019   Dr. Magnus Ivan hip   KNEE ARTHROSCOPY Left    06/07/2017 Dr. August [facility]   KNEE ARTHROSCOPY Left 03/21/2020   Procedure: LEFT KNEE ARTHROSCOPY WITH PARTIAL MEDIAL MENISCECTOMY;  Surgeon: [name], MD;  Location: [facility];  Service: Orthopedics;  Laterality: Left;   KNEE CLOSED REDUCTION Right 12/06/2015   Procedure: CLOSED MANIPULATION RIGHT KNEE;  Surgeon: Kerrin Champagne, MD;  Location: [facility] OR;  Service: Orthopedics;  Laterality: Right;   KNEE CLOSED REDUCTION Right 01/17/2016   Procedure: CLOSED MANIPULATION  RIGHT KNEE;  Surgeon: Kerrin Champagne, MD;  Location: [facility] OR;  Service: Orthopedics;  Laterality: Right;   KNEE JOINT MANIPULATION Right 12/06/2015   LACRIMAL TUBE INSERTION Bilateral 03/01/2019   Procedure: LACRIMAL TUBE INSERTION;  Surgeon: Aura Camps, MD;  Location: [facility];  Service: Ophthalmology;  Laterality: Bilateral;   LACRIMAL TUBE REMOVAL Bilateral 05/03/2019   Procedure: BILATERAL NASOLACRIMAL DUCT PROBING, IIRIGATION AND TUBE REMOVAL BOTH EYES;  Surgeon: Aura Camps, MD;  Location: [facility];  Service: Ophthalmology;  Laterality: Bilateral;   LUMBAR LAMINECTOMY/DECOMPRESSION MICRODISCECTOMY N/A 06/03/2015   Procedure: Bilateral lateral recess decompression L2-3, L3-4, L4-5;  Surgeon: Kerrin Champagne, MD;  Location: [facility];  Service: Orthopedics;  Laterality: N/A;   LUMBAR LAMINECTOMY/DECOMPRESSION MICRODISCECTOMY N/A 10/02/2016   Procedure: Right L5-S1 Lateral Recess Decompression  microdiscectomy;  Surgeon: Kerrin Champagne, MD;  Location: [facility] OR;  Service: Orthopedics;  Laterality: N/A;   LUMBAR LAMINECTOMY/DECOMPRESSION MICRODISCECTOMY N/A 07/15/2018   Procedure: LEFT L3-4 MICRODISCECTOMY;  Surgeon: Kerrin Champagne, MD;  Location: [facility] OR;  Service: Orthopedics;  Laterality: N/A;   svd      x 2   TEAR DUCT PROBING Bilateral  03/01/2019   Procedure: TEAR DUCT PROBING WITH IRRIGATION;  Surgeon: Aura Camps, MD;  Location: [facility];  Service: Ophthalmology;  Laterality: Bilateral;   TOTAL HIP ARTHROPLASTY Left 03/14/2019   Procedure: LEFT TOTAL HIP ARTHROPLASTY ANTERIOR APPROACH;  Surgeon: [name], MD;  Location: [facility];  Service: Orthopedics;  Laterality: Left;   TOTAL KNEE ARTHROPLASTY Right 09/06/2015   Procedure: RIGHT TOTAL KNEE ARTHROPLASTY;  Surgeon: Kerrin Champagne, MD;  Location: [facility];  Service: Orthopedics;  Laterality: Right;   TOTAL KNEE REVISION Right 05/12/2022   Procedure: RIGHT TOTAL KNEE REVISION  ARTHROPLASTY;  Surgeon: [name], MD;  Location: [facility];  Service: Orthopedics;  Laterality: Right;   TUBAL LIGATION     UPPER GASTROINTESTINAL ENDOSCOPY  04/28/2011   Patient Active Problem List   Diagnosis Date Noted   Status post revision of total replacement of right knee 05/12/2022   Personal history of colonic polyps 03/20/2022   Failed total knee, right, subsequent encounter 03/02/2022   Loose right total knee arthroplasty (HCC) 03/02/2022   Mixed stress and urge urinary incontinence 12/04/2021   Functional fecal incontinence 12/04/2021   IBS (irritable bowel syndrome) 11/19/2021   Spondylolisthesis, lumbar region    Other spondylosis with radiculopathy, lumbar region    Other secondary scoliosis, lumbar region    Fusion of spine of lumbar region 03/25/2021   Colon polyps 06/06/2020   Lupus (HCC) 06/06/2020   Allergic rhinitis due to animal (cat) (dog) hair and dander 04/08/2020   Allergic rhinitis due to pollen 04/08/2020   Food allergy 04/08/2020   Acute medial meniscus tear, left, subsequent encounter 03/21/2020   Chronic pain of left knee 02/15/2020   Paresthesia of skin 11/16/2019   History of total knee replacement, right 11/16/2019   Tobacco abuse 11/16/2019   Centrilobular emphysema (HCC) 08/10/2019   Incidental lung nodule, > 3mm and < 8mm 08/10/2019   OSA on CPAP 08/10/2019   Dyspnea on exertion 08/02/2019   Hyperlipidemia 08/02/2019   Lumbar radiculopathy 04/24/2019   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   Post laminectomy syndrome 02/23/2019   Abnormality of gait 02/23/2019   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   Unilateral primary osteoarthritis, left hip 12/28/2018   Lesion of skin of left ear 12/26/2018   Primary osteoarthritis of left hip 12/02/2018   Iron deficiency anemia 10/16/2018   Chronic pain syndrome 09/08/2018   Chronic pain of right knee 08/11/2018   Status post lumbar laminectomy 07/15/2018   Peripheral arterial disease  (HCC) 04/05/2018   Moderate persistent asthma without complication 06/29/2017   Environmental and seasonal allergies 06/29/2017   Controlled type 2 diabetes mellitus with diabetic polyneuropathy, without long-term current use of insulin (HCC) 06/29/2017   Perennial allergic rhinitis 04/08/2017   Sensorineural hearing loss (SNHL), bilateral 04/08/2017   Chronic pansinusitis 03/25/2017   Eustachian tube dysfunction, bilateral 03/25/2017   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   Herniation of lumbar intervertebral disc with radiculopathy 10/02/2016    Class: Chronic   Alopecia areata 08/19/2016   Chondromalacia of both patellae 06/03/2015    Class: Chronic   Spinal stenosis, lumbar region, with neurogenic claudication 06/03/2015   Tobacco use disorder 04/25/2015   DJD (degenerative joint disease) of knee 01/04/2015   Hemorrhoid 11/14/2014   Gout of big toe 07/19/2014   Essential hypertension 08/14/2013   Gastroesophageal reflux disease without esophagitis 08/14/2013   COPD (chronic obstructive pulmonary disease) (HCC) 04/17/2011    PCP: Jonah Blue  REFERRING PROVIDER: Thayer Ohm  Magnus Ivan  REFERRING DIAG: [phone] (ICD-10-CM) - Status post total right knee replacement   THERAPY DIAG:  Acute pain of right knee  Difficulty in walking, not elsewhere classified  Muscle weakness (generalized)  Stiffness of right knee, not elsewhere classified  Repeated falls  Rationale for Evaluation and Treatment: Rehabilitation  ONSET DATE: 05/12/22  SUBJECTIVE:   SUBJECTIVE STATEMENT: "L knee went out on me Sunday, going across the yard" No injuries  PERTINENT HISTORY: L hip replacement, L knee scope, R knee replacement and reduction now revision  PAIN:  Are you having pain? Yes: NPRS scale: 0/10 Pain location: R knee Pain description: throbbing, aching  Aggravating factors: standing or walking too long Relieving factors: ice, pain meds   PRECAUTIONS: None  WEIGHT BEARING RESTRICTIONS:  No  FALLS:  Has patient fallen in last 6 months? Yes. Number of falls 5, knees buckle on me   LIVING ENVIRONMENT: Lives with: lives with their spouse Lives in: House/apartment Stairs: Yes: Internal: 12 steps; on left going up Has following equipment at home: Single point cane, Walker - 2 wheeled, and Environmental consultant - 4 wheeled  OCCUPATION: Retired  PLOF: Independent with basic ADLs and Independent with household mobility with device  PATIENT GOALS: getting better and be able to move around better   NEXT MD VISIT: 06/24/22  OBJECTIVE:   DIAGNOSTIC FINDINGS: FINDINGS: Postsurgical changes of right knee arthroplasty revision with constrained long-stem arthroplasty. No evidence of loosening or fracture. Expected soft tissue changes.   IMPRESSION: Postsurgical changes of right knee arthroplasty revision. No evidence of immediate hardware complication  PATIENT SURVEYS:  FOTO 51  COGNITION: Overall cognitive status: Within functional limits for tasks assessed     SENSATION: WFL   MUSCLE LENGTH: Hamstrings: bilateral tightness  POSTURE: rounded shoulders   LOWER EXTREMITY ROM:  Active ROM Right eval Right 06/30/22 Right  07/10/22 Right 07/16/22  Hip flexion      Hip extension      Hip abduction      Hip adduction      Hip internal rotation      Hip external rotation      Knee flexion 95 105 105 110  Knee extension -18 11 7 3   Ankle dorsiflexion      Ankle plantarflexion      Ankle inversion      Ankle eversion       (Blank rows = not tested)  LOWER EXTREMITY MMT:  MMT Right eval Right  07/08/22  Hip flexion 3+ 5  Hip extension    Hip abduction    Hip adduction    Hip internal rotation    Hip external rotation    Knee flexion 3+ 5  Knee extension 3+  5  Ankle dorsiflexion    Ankle plantarflexion    Ankle inversion    Ankle eversion     (Blank rows = not tested)   FUNCTIONAL TESTS:  5 times sit to stand: 17.21s Timed up and go (TUG):  15.82s  GAIT: Distance walked: in clinic distances Assistive device utilized: Environmental consultant - 4 wheeled and None Level of assistance: Complete Independence and Modified independence Comments: able to complete TUG without AD, uses rollator on a regular basis    TODAY'S TREATMENT:  DATE:  07/21/22 NuStep L5 x 7 min Hamstring curls 35lb 2x10, RLE 15lb 2x10 Leg Ext 15lb 2x10, RLE 5lb 2x10 Tandem and side step on airex in // bars  Heel raises black bar 2x12 S2S holding blue ball   07/16/22 NuStep L5 x 7 min R knee ROM with end range holds  40lb Leg press 2x12 Hamstring curls 25lb 2x12, RLE 15lb 2x10 Leg Ext 15lb 2x10, RLE 5lb 2x10 S2S holding blue ball 2x12   07/14/22 NuStep L5 x LE only  4in box on airex forward & Lateral step ups x10 each HS curls 25lb 2x12, RLE 15lb 2x10 Leg Ext 10lb 2x12, Single leg 5lb 2x10 each  S2S holding blue ball 2x12 R knee PROM with end range holds Leg press 40lb 2x12   07/10/22 NuStep L5 x LE only  Walking 5 big laps in gym without AD   S2S LE on airex holding yellow ball 2x12 R knee PROM flex & Ext  HS curls 25lb 2x12, RLE 15lb 2x10 Leg Ext 10lb 2x12, Single leg 5lb 2x10 each  4in box on airex x 10   07/08/22 NuStep L5 x16mins  Walking 5 big laps in gym without AD   Leg ext 10# 2x10, RLE 5# 2x10  HS curls 25# 2x10, RLE 15# 2x10 Leg press 40# 2x10, RLE 20# x10 Step ups 6"  Step downs 4"   07/02/22 NuStep L 5 x 6 min TUG 5x S2S Gait 6 laps ~ 729ft w/ rolator Hamstring Curls 25lb 2x12, RLE 15lb 2x10  Leg Ext 10lb 2x12, RLE 5lb 2x10  Leg press 30lb 2x12 4 in box on airex step ups 2x10 each  06/30/22 NuStep L 5 x 6 min R knee PROM with end range holds, patella mobs S2S 2x10 holding yellow ball  Standing march 2x10 Hamstring Curls 25lb 2x12 Leg Ext 10lb 2x12, RLE 5lb 2x10  6in step ups x10 Lateral 4in step  ups  Heel raises 2x15  06/25/22 NuStep L5 x 6 min R knee PROM with end range holds, patella mobs S2S 2x10 holding yellow wball  Resisted gait 30lb all directions x4 each, LOB x 1 Max assit to recover  4in steps up x 10 each, 6in step ups x 10 each  HS curls 25lb 2x10     PATIENT EDUCATION:  Education details: HEP and POC Person educated: Patient Education method: Explanation Education comprehension: verbalized understanding  HOME EXERCISE PROGRAM: Access Code: [email or web address] URL: [email or web address] Date: 06/16/2022 Prepared by: Cassie Freer  Exercises - Mini Squat with Counter Support  - 1 x daily - 7 x weekly - 3 sets - 10 reps - Standing Hip Abduction with Unilateral Counter Support  - 1 x daily - 7 x weekly - 3 sets - 10 reps - Standing March with Unilateral Counter Support  - 1 x daily - 7 x weekly - 3 sets - 10 reps - Heel Raises with Counter Support  - 1 x daily - 7 x weekly - 3 sets - 10 reps - Sit to Stand  - 1 x daily - 7 x weekly - 3 sets - 10 reps - Seated Hamstring Stretch  - 1 x daily - 7 x weekly - 2 reps - 30 hold  ASSESSMENT:  CLINICAL IMPRESSION: Patient was seen today for physical therapy treatment for R total knee revision. She continues to do well with functional interventions. She also continues to report that her L knee gives on her at times causing her to fall at  home. Some fatigue reported with S2S. Still has some limitations with R knee extension.  Does well on machines today and with isolated RLE strengthening.    OBJECTIVE IMPAIRMENTS: Abnormal gait, decreased mobility, difficulty walking, decreased ROM, decreased strength, and pain.   REHAB POTENTIAL: Good  CLINICAL DECISION MAKING: Stable/uncomplicated  EVALUATION COMPLEXITY: Low   GOALS: Goals reviewed with patient? Yes  SHORT TERM GOALS: Target date: 07/28/22  Patient will be independent with initial HEP. Goal status: Progressing 06/30/22  2.  Patient will demonstrate 5xSTS  in < 13s  Baseline: 17.21s Goal status: Met 11.86 07/02/22    LONG TERM GOALS: Target date: 09/01/22  Patient will be independent with advanced/ongoing HEP to improve outcomes and carryover.  Goal status: INITIAL  2.  Patient will report at least 75% improvement in R knee pain to improve QOL. Baseline: 7/10 at worst Goal status: Progressing 50% 07/02/22  3.  Patient will demonstrate improved R knee AROM to >/= 0-120 deg to allow for normal gait and stair mechanics. Baseline: 18-0-95 Goal status: Progressing 07/10/22  4.  Patient will demonstrate improved functional LE strength as demonstrated by 5/5. Baseline: 3+, 5/5 07/08/22 Goal status: MET  5.  Patient will be able to ambulate 600' with LRAD and normal gait pattern without increased pain to access community.  Baseline: using rollator walking Goal status: Met 07/02/22  6. Patient will be able to do TUG <12s.  Baseline: 15.82s Goal status: Met 9.35 07/02/22  7.  Patient will report 48 on FOTO (patient reported outcome measure) to demonstrate improved functional ability. Baseline: 51 Goal status: Progressing 61 07/16/22   PLAN:  PT FREQUENCY: 2x/week  PT DURATION: 10 weeks  PLANNED INTERVENTIONS: Therapeutic exercises, Therapeutic activity, Neuromuscular re-education, Balance training, Gait training, Patient/Family education, Self Care, Joint mobilization, Stair training, Dry Needling, Cryotherapy, Moist heat, Vasopneumatic device, Ionotophoresis 4mg /ml Dexamethasone, and Manual therapy  PLAN FOR NEXT SESSION: start gym activities for R knee, work on ROM and strengthening    Grayce Sessions, PTA 07/21/2022, 7:57 AM

## 2022-07-22 DIAGNOSIS — F331 Major depressive disorder, recurrent, moderate: Secondary | ICD-10-CM | POA: Diagnosis not present

## 2022-07-23 ENCOUNTER — Ambulatory Visit: Payer: Medicaid Other | Admitting: Physical Therapy

## 2022-07-23 ENCOUNTER — Encounter: Payer: Self-pay | Admitting: Physical Therapy

## 2022-07-23 DIAGNOSIS — R296 Repeated falls: Secondary | ICD-10-CM

## 2022-07-23 DIAGNOSIS — M6281 Muscle weakness (generalized): Secondary | ICD-10-CM

## 2022-07-23 DIAGNOSIS — R262 Difficulty in walking, not elsewhere classified: Secondary | ICD-10-CM

## 2022-07-23 DIAGNOSIS — M25661 Stiffness of right knee, not elsewhere classified: Secondary | ICD-10-CM

## 2022-07-23 DIAGNOSIS — M25561 Pain in right knee: Secondary | ICD-10-CM

## 2022-07-23 NOTE — Therapy (Signed)
OUTPATIENT PHYSICAL THERAPY LOWER EXTREMITY TREATMENT   Patient Name: Stacy [name] [facility] MRN: [id number] DOB:September 29, 1960, 62 y.o., female Today's Date: 07/23/2022  END OF SESSION:  PT End of Session - 07/23/22 0804     Visit Number 12    Date for PT Re-Evaluation 09/01/22    PT Start Time 0800    PT Stop Time 0845    PT Time Calculation (min) 45 min    Activity Tolerance Patient tolerated treatment well    Behavior During Therapy WFL for tasks assessed/performed             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   Asthma    COPD (chronic obstructive pulmonary disease) (HCC)    Depression    Diabetes mellitus    Type II   GERD (gastroesophageal reflux disease)    Pt on Protonix daily   Glaucoma    Gout    Headache(784.0)    otc meds prn   Hyperlipidemia    Hypertension    Irritable bowel syndrome 11/19/2010   Neuropathy    Pneumonia YRS AGO   Restless legs    Shortness of breath    07/14/2018- uses  4 times a day   Sleep apnea    Past Surgical History:  Procedure Laterality Date   BACK SURGERY     feb 21, 23   CHOLECYSTECTOMY     COLONOSCOPY     ENDOMETRIAL ABLATION  10/2010   HERNIA REPAIR     umbicial hernia   JOINT REPLACEMENT Left 03/14/2019   Dr. Magnus Ivan hip   KNEE ARTHROSCOPY Left    06/07/2017 Dr. August [facility]   KNEE ARTHROSCOPY Left 03/21/2020   Procedure: LEFT KNEE ARTHROSCOPY WITH PARTIAL MEDIAL MENISCECTOMY;  Surgeon: [name], MD;  Location: [facility];  Service: Orthopedics;  Laterality: Left;   KNEE CLOSED REDUCTION Right 12/06/2015   Procedure: CLOSED MANIPULATION RIGHT KNEE;  Surgeon: Kerrin Champagne, MD;  Location: [facility];  Service: Orthopedics;  Laterality: Right;   KNEE CLOSED REDUCTION Right 01/17/2016   Procedure: CLOSED MANIPULATION RIGHT KNEE;  Surgeon: Kerrin Champagne, MD;  Location: [facility];  Service: Orthopedics;  Laterality: Right;   KNEE JOINT MANIPULATION Right 12/06/2015   LACRIMAL TUBE INSERTION Bilateral 03/01/2019   Procedure: LACRIMAL TUBE INSERTION;  Surgeon: Aura Camps, MD;  Location: [facility];  Service: Ophthalmology;  Laterality: Bilateral;   LACRIMAL TUBE REMOVAL Bilateral 05/03/2019   Procedure: BILATERAL NASOLACRIMAL DUCT PROBING, IIRIGATION AND TUBE REMOVAL BOTH EYES;  Surgeon: Aura Camps, MD;  Location: [facility];  Service: Ophthalmology;  Laterality: Bilateral;   LUMBAR LAMINECTOMY/DECOMPRESSION MICRODISCECTOMY N/A 06/03/2015   Procedure: Bilateral lateral recess decompression L2-3, L3-4, L4-5;  Surgeon: Kerrin Champagne, MD;  Location: [facility];  Service: Orthopedics;  Laterality: N/A;   LUMBAR LAMINECTOMY/DECOMPRESSION MICRODISCECTOMY N/A 10/02/2016   Procedure: Right L5-S1 Lateral Recess Decompression  microdiscectomy;  Surgeon: Kerrin Champagne, MD;  Location: [facility];  Service: Orthopedics;  Laterality: N/A;   LUMBAR LAMINECTOMY/DECOMPRESSION MICRODISCECTOMY N/A 07/15/2018   Procedure: LEFT L3-4 MICRODISCECTOMY;  Surgeon: Kerrin Champagne, MD;  Location: [facility] OR;  Service: Orthopedics;  Laterality: N/A;   svd      x 2   TEAR DUCT PROBING Bilateral 03/01/2019   Procedure: TEAR DUCT PROBING  WITH IRRIGATION;  Surgeon: Aura Camps, MD;  Location: [facility];  Service: Ophthalmology;  Laterality: Bilateral;   TOTAL HIP ARTHROPLASTY Left 03/14/2019   Procedure: LEFT TOTAL HIP ARTHROPLASTY ANTERIOR APPROACH;  Surgeon: [name], MD;  Location: [facility];  Service: Orthopedics;  Laterality: Left;   TOTAL KNEE ARTHROPLASTY Right 09/06/2015   Procedure: RIGHT TOTAL KNEE ARTHROPLASTY;  Surgeon: Kerrin Champagne, MD;  Location: [facility];  Service: Orthopedics;  Laterality: Right;   TOTAL KNEE REVISION Right 05/12/2022   Procedure: RIGHT TOTAL KNEE REVISION ARTHROPLASTY;  Surgeon:  [name], MD;  Location: [facility];  Service: Orthopedics;  Laterality: Right;   TUBAL LIGATION     UPPER GASTROINTESTINAL ENDOSCOPY  04/28/2011   Patient Active Problem List   Diagnosis Date Noted   Status post revision of total replacement of right knee 05/12/2022   Personal history of colonic polyps 03/20/2022   Failed total knee, right, subsequent encounter 03/02/2022   Loose right total knee arthroplasty (HCC) 03/02/2022   Mixed stress and urge urinary incontinence 12/04/2021   Functional fecal incontinence 12/04/2021   IBS (irritable bowel syndrome) 11/19/2021   Spondylolisthesis, lumbar region    Other spondylosis with radiculopathy, lumbar region    Other secondary scoliosis, lumbar region    Fusion of spine of lumbar region 03/25/2021   Colon polyps 06/06/2020   Lupus (HCC) 06/06/2020   Allergic rhinitis due to animal (cat) (dog) hair and dander 04/08/2020   Allergic rhinitis due to pollen 04/08/2020   Food allergy 04/08/2020   Acute medial meniscus tear, left, subsequent encounter 03/21/2020   Chronic pain of left knee 02/15/2020   Paresthesia of skin 11/16/2019   History of total knee replacement, right 11/16/2019   Tobacco abuse 11/16/2019   Centrilobular emphysema (HCC) 08/10/2019   Incidental lung nodule, > 3mm and < 8mm 08/10/2019   OSA on CPAP 08/10/2019   Dyspnea on exertion 08/02/2019   Hyperlipidemia 08/02/2019   Lumbar radiculopathy 04/24/2019   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   Post laminectomy syndrome 02/23/2019   Abnormality of gait 02/23/2019   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   Unilateral primary osteoarthritis, left hip 12/28/2018   Lesion of skin of left ear 12/26/2018   Primary osteoarthritis of left hip 12/02/2018   Iron deficiency anemia 10/16/2018   Chronic pain syndrome 09/08/2018   Chronic pain of right knee 08/11/2018   Status post lumbar laminectomy 07/15/2018   Peripheral arterial disease (HCC) 04/05/2018    Moderate persistent asthma without complication 06/29/2017   Environmental and seasonal allergies 06/29/2017   Controlled type 2 diabetes mellitus with diabetic polyneuropathy, without long-term current use of insulin (HCC) 06/29/2017   Perennial allergic rhinitis 04/08/2017   Sensorineural hearing loss (SNHL), bilateral 04/08/2017   Chronic pansinusitis 03/25/2017   Eustachian tube dysfunction, bilateral 03/25/2017   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   Herniation of lumbar intervertebral disc with radiculopathy 10/02/2016    Class: Chronic   Alopecia areata 08/19/2016   Chondromalacia of both patellae 06/03/2015    Class: Chronic   Spinal stenosis, lumbar region, with neurogenic claudication 06/03/2015   Tobacco use disorder 04/25/2015   DJD (degenerative joint disease) of knee 01/04/2015   Hemorrhoid 11/14/2014   Gout of big toe 07/19/2014   Essential hypertension 08/14/2013   Gastroesophageal reflux disease without esophagitis 08/14/2013   COPD (chronic obstructive pulmonary disease) (HCC) 04/17/2011    PCP: Jonah Blue  REFERRING PROVIDER: Allie Bossier  REFERRING DIAG: [phone] (ICD-10-CM) -  Status post total right knee replacement   THERAPY DIAG:  Acute pain of right knee  Muscle weakness (generalized)  Stiffness of right knee, not elsewhere classified  Difficulty in walking, not elsewhere classified  Repeated falls  Rationale for Evaluation and Treatment: Rehabilitation  ONSET DATE: 05/12/22  SUBJECTIVE:   SUBJECTIVE STATEMENT: Had a fall Tuesday and yesterday evening. L knee buckled on her after going for a long walk with Rolator.   PERTINENT HISTORY: L hip replacement, L knee scope, R knee replacement and reduction now revision  PAIN:  Are you having pain? Yes: NPRS scale: 0/10 Pain location: R knee Pain description: throbbing, aching  Aggravating factors: standing or walking too long Relieving factors: ice, pain meds   PRECAUTIONS: None  WEIGHT  BEARING RESTRICTIONS: No  FALLS:  Has patient fallen in last 6 months? Yes. Number of falls 5, knees buckle on me   LIVING ENVIRONMENT: Lives with: lives with their spouse Lives in: House/apartment Stairs: Yes: Internal: 12 steps; on left going up Has following equipment at home: Single point cane, Walker - 2 wheeled, and Environmental consultant - 4 wheeled  OCCUPATION: Retired  PLOF: Independent with basic ADLs and Independent with household mobility with device  PATIENT GOALS: getting better and be able to move around better   NEXT MD VISIT: 06/24/22  OBJECTIVE:   DIAGNOSTIC FINDINGS: FINDINGS: Postsurgical changes of right knee arthroplasty revision with constrained long-stem arthroplasty. No evidence of loosening or fracture. Expected soft tissue changes.   IMPRESSION: Postsurgical changes of right knee arthroplasty revision. No evidence of immediate hardware complication  PATIENT SURVEYS:  FOTO 51  COGNITION: Overall cognitive status: Within functional limits for tasks assessed     SENSATION: [facility]   MUSCLE LENGTH: Hamstrings: bilateral tightness  POSTURE: rounded shoulders   LOWER EXTREMITY ROM:  Active ROM Right eval Right 06/30/22 Right  07/10/22 Right 07/16/22 Right 07/23/22  Hip flexion       Hip extension       Hip abduction       Hip adduction       Hip internal rotation       Hip external rotation       Knee flexion 95 105 105 110 107  Knee extension -18 11 7 3 5   Ankle dorsiflexion       Ankle plantarflexion       Ankle inversion       Ankle eversion        (Blank rows = not tested)  LOWER EXTREMITY MMT:  MMT Right eval Right  07/08/22  Hip flexion 3+ 5  Hip extension    Hip abduction    Hip adduction    Hip internal rotation    Hip external rotation    Knee flexion 3+ 5  Knee extension 3+  5  Ankle dorsiflexion    Ankle plantarflexion    Ankle inversion    Ankle eversion     (Blank rows = not tested)   FUNCTIONAL TESTS:  5 times sit to stand:  17.21s Timed up and go (TUG): 15.82s  GAIT: Distance walked: in clinic distances Assistive device utilized: Environmental consultant - 4 wheeled and None Level of assistance: Complete Independence and Modified independence Comments: able to complete TUG without AD, uses rollator on a regular basis    TODAY'S TREATMENT:  DATE:  07/23/22 NuStep L5 x 7 min S2S holding blue ball 2x12 35lb Hamstring curls 2x10, RLE 20lb 2x10 Leg Ext 15lb 2x10, RLE 5lb 2x10   07/21/22 NuStep L5 x 7 min Hamstring curls 35lb 2x10, RLE 15lb 2x10 Leg Ext 15lb 2x10, RLE 5lb 2x10 Tandem and side step on airex in // bars  Heel raises black bar 2x12 S2S holding blue ball   07/16/22 NuStep L5 x 7 min R knee ROM with end range holds  40lb Leg press 2x12 Hamstring curls 25lb 2x12, RLE 15lb 2x10 Leg Ext 15lb 2x10, RLE 5lb 2x10 S2S holding blue ball 2x12   07/14/22 NuStep L5 x LE only  4in box on airex forward & Lateral step ups x10 each HS curls 25lb 2x12, RLE 15lb 2x10 Leg Ext 10lb 2x12, Single leg 5lb 2x10 each  S2S holding blue ball 2x12 R knee PROM with end range holds Leg press 40lb 2x12   07/10/22 NuStep L5 x LE only  Walking 5 big laps in gym without AD   S2S LE on airex holding yellow ball 2x12 R knee PROM flex & Ext  HS curls 25lb 2x12, RLE 15lb 2x10 Leg Ext 10lb 2x12, Single leg 5lb 2x10 each  4in box on airex x 10   PATIENT EDUCATION:  Education details: HEP and POC Person educated: Patient Education method: Explanation Education comprehension: verbalized understanding  HOME EXERCISE PROGRAM: Access Code: [email or web address] URL: [email or web address] Date: 06/16/2022 Prepared by: Cassie Freer  Exercises - Mini Squat with Counter Support  - 1 x daily - 7 x weekly - 3 sets - 10 reps - Standing Hip Abduction with Unilateral Counter Support  - 1 x daily - 7 x  weekly - 3 sets - 10 reps - Standing March with Unilateral Counter Support  - 1 x daily - 7 x weekly - 3 sets - 10 reps - Heel Raises with Counter Support  - 1 x daily - 7 x weekly - 3 sets - 10 reps - Sit to Stand  - 1 x daily - 7 x weekly - 3 sets - 10 reps - Seated Hamstring Stretch  - 1 x daily - 7 x weekly - 2 reps - 30 hold  ASSESSMENT:  CLINICAL IMPRESSION: Patient was seen today for physical therapy treatment for R total knee revision. She continues to do well with functional interventions. Pt assured she was fine to ambulate without Rolator during session, L knee did give out once ambulating to in clinic, no injuring sustain and was able to get up independently. Therapist was setting up the next interventions for pt. All interventions completed well, before and after incident. She still has some limitations with R knee extension.  Does well on machines today and with isolated RLE strengthening.  Pt stated this were her last day due to her having a co pay.   OBJECTIVE IMPAIRMENTS: Abnormal gait, decreased mobility, difficulty walking, decreased ROM, decreased strength, and pain.   REHAB POTENTIAL: Good  CLINICAL DECISION MAKING: Stable/uncomplicated  EVALUATION COMPLEXITY: Low   GOALS: Goals reviewed with patient? Yes  SHORT TERM GOALS: Target date: 07/28/22  Patient will be independent with initial HEP. Goal status: Progressing 06/30/22  2.  Patient will demonstrate 5xSTS in < 13s  Baseline: 17.21s Goal status: Met 11.86 07/02/22    LONG TERM GOALS: Target date: 09/01/22  Patient will be independent with advanced/ongoing HEP to improve outcomes and carryover.  Goal status: INITIAL  2.  Patient will report at least  75% improvement in R knee pain to improve QOL. Baseline: 7/10 at worst Goal status: Met 07/23/22  3.  Patient will demonstrate improved R knee AROM to >/= 0-120 deg to allow for normal gait and stair mechanics. Baseline: 18-0-95 Goal status: Progressing  07/23/22  4.  Patient will demonstrate improved functional LE strength as demonstrated by 5/5. Baseline: 3+, 5/5 07/08/22 Goal status: MET  5.  Patient will be able to ambulate 600' with LRAD and normal gait pattern without increased pain to access community.  Baseline: using rollator walking Goal status: Met 07/02/22  6. Patient will be able to do TUG <12s.  Baseline: 15.82s Goal status: Met 9.35 07/02/22  7.  Patient will report 49 on FOTO (patient reported outcome measure) to demonstrate improved functional ability. Baseline: 51 Goal status: Progressing 61 07/16/22   PLAN:  PT FREQUENCY: 2x/week  PT DURATION: 10 weeks  PLANNED INTERVENTIONS: Therapeutic exercises, Therapeutic activity, Neuromuscular re-education, Balance training, Gait training, Patient/Family education, Self Care, Joint mobilization, Stair training, Dry Needling, Cryotherapy, Moist heat, Vasopneumatic device, Ionotophoresis 4mg /ml Dexamethasone, and Manual therapy  PLAN FOR NEXT SESSION: D/C  PHYSICAL THERAPY DISCHARGE SUMMARY  Visits from Start of Care: 12    Patient agrees to discharge. Patient goals were partially met. Patient is being discharged due to financial reasons.    Cassie Freer, DPT Grayce Sessions, PTA 07/23/2022, 8:05 AM

## 2022-07-29 DIAGNOSIS — F331 Major depressive disorder, recurrent, moderate: Secondary | ICD-10-CM | POA: Diagnosis not present

## 2022-08-03 ENCOUNTER — Other Ambulatory Visit: Payer: Self-pay | Admitting: Internal Medicine

## 2022-08-03 DIAGNOSIS — R159 Full incontinence of feces: Secondary | ICD-10-CM | POA: Diagnosis not present

## 2022-08-03 DIAGNOSIS — R32 Unspecified urinary incontinence: Secondary | ICD-10-CM | POA: Diagnosis not present

## 2022-08-03 DIAGNOSIS — I10 Essential (primary) hypertension: Secondary | ICD-10-CM | POA: Diagnosis not present

## 2022-08-04 NOTE — Telephone Encounter (Signed)
Requested Prescriptions  Pending Prescriptions Disp Refills   atorvastatin (LIPITOR) 40 MG tablet [Pharmacy Med Name: ATORVASTATIN CALCIUM 40 MG ORAL TABLET] 90 tablet 1    Sig: TAKE 1 TABLET (40 MG TOTAL) BY MOUTH DAILY. (BEDTIME)     Cardiovascular:  Antilipid - Statins Failed - 08/03/2022  2:42 PM      Failed - Lipid Panel in normal range within the last 12 months    Cholesterol, Total  Date Value Ref Range Status  01/05/2022 132 100 - 199 mg/dL Final   LDL Chol Calc (NIH)  Date Value Ref Range Status  01/05/2022 64 0 - 99 mg/dL Final   HDL  Date Value Ref Range Status  01/05/2022 47 >39 mg/dL Final   Triglycerides  Date Value Ref Range Status  01/05/2022 116 0 - 149 mg/dL Final         Passed - Patient is not pregnant      Passed - Valid encounter within last 12 months    Recent Outpatient Visits           1 month ago Controlled type 2 diabetes mellitus with diabetic polyneuropathy, without long-term current use of insulin [facility])   [place] [place], [place], [place]   2 months ago Coarse tremors   [place] [facility] Jonah Blue B, MD   2 months ago Type 2 diabetes mellitus with peripheral neuropathy [facility])   [place] [facility] Jonah Blue B, MD   7 months ago Type 2 diabetes mellitus with peripheral neuropathy [facility])   [place] [facility] Marcine Matar, MD   8 months ago Administrative encounter   [name] Marcine Matar, MD       Future Appointments             In 1 month Laural Benes, Binnie Rail, MD [facility]

## 2022-08-05 DIAGNOSIS — F331 Major depressive disorder, recurrent, moderate: Secondary | ICD-10-CM | POA: Diagnosis not present

## 2022-08-12 ENCOUNTER — Other Ambulatory Visit: Payer: Medicaid Other

## 2022-08-12 DIAGNOSIS — F331 Major depressive disorder, recurrent, moderate: Secondary | ICD-10-CM | POA: Diagnosis not present

## 2022-08-12 NOTE — Patient Outreach (Signed)
Medicaid Managed Care Social Work Note  08/12/2022 Name:  Stacy [name] [facility] MRN:  [id number] DOB:  [birth year]  Stacy [name] Stacy [name] is an 62 y.o. year old female who is a primary patient of Marcine Matar, MD.  The Medicaid Managed Care Coordination team was consulted for assistance with:  Community Resources   Ms. Dingwall was given information about Medicaid Managed Care Coordination team services today. Stacy [name] Stacy [name] Patient agreed to services and verbal consent obtained.  Engaged with patient  for by telephone forfollow up visit in response to referral for case management and/or care coordination services.   Assessments/Interventions:  Review of past medical history, allergies, medications, health status, including review of consultants reports, laboratory and other test data, was performed as part of comprehensive evaluation and provision of chronic care management services.  SDOH: (Social Determinant of Health) assessments and interventions performed: SDOH Interventions    Flowsheet Row Patient Outreach Telephone from 07/03/2022 in [place] POPULATION HEALTH DEPARTMENT Patient Outreach Telephone from 05/20/2022 in [place] POPULATION HEALTH DEPARTMENT Patient Outreach Telephone from 03/09/2022 in [place] POPULATION HEALTH DEPARTMENT Patient Outreach Telephone from 01/07/2022 in  POPULATION HEALTH DEPARTMENT Patient Outreach Telephone from 10/21/2021 in [facility] [employer] Care Coordination  SDOH Interventions       Food Insecurity Interventions -- -- Intervention Not Indicated -- --  Housing Interventions -- -- -- -- Intervention Not Indicated  Transportation Interventions Intervention Not Indicated Payor Benefit Intervention Not Indicated -- Intervention Not Indicated  Alcohol Usage Interventions -- -- -- Intervention Not Indicated (Score <7) --     BSW completed a telephone outreach with patient. She states things are going well. Rent  is being paid. She did not receive the food bag BSW sent referral in for. BSW will send patient food resources.  Advanced Directives Status:  Not addressed in this encounter.  Care Plan                 Allergies  Allergen Reactions   Other Shortness Of Breath    UNSPECIFIED AGENTS Allergic to perfumes and cleaning products   Shellfish Allergy Anaphylaxis    Per allergy test.   Ace Inhibitors Cough and Other (See Comments)        Celebrex [Celecoxib]      upset stomach  Pt is taking med*   Shellfish-Derived Products Other (See Comments)   Aspirin Nausea Only and Other (See Comments)    stomach upset  Other reaction(s): Nausea Only    Medications Reviewed Today     Reviewed by Teresa Pelton, PTA (Physical Therapy Assistant) on 07/23/22 at 0804  Med List Status: <None>   Medication Order Taking? Sig Documenting Provider Last Dose Status Informant  albuterol (PROVENTIL) (2.5 MG/3ML) 0.083% nebulizer solution [id number] No Use as directed up to 3 times daily Georgian Co M, PA-C Taking Active   allopurinol (ZYLOPRIM) 100 MG tablet [id number] No TAKE 1 TABLET (100 MG TOTAL) BY MOUTH DAILY.(AM) Anders Simmonds, PA-C Taking Active   atorvastatin (LIPITOR) 40 MG tablet [id number] No TAKE 1 TABLET (40 MG TOTAL) BY MOUTH DAILY. (BEDTIME) Marcine Matar, MD Taking Active Self, Pharmacy Records  Blood Glucose Monitoring Suppl (ACCU-CHEK AVIVA PLUS) w/Device KIT [id number] No 1 each by Does not apply route 3 (three) times daily. Anders Simmonds, PA-C Taking Active Self, Pharmacy Records  celecoxib (CELEBREX) 200 MG capsule [id number] No Take 200 mg by mouth 2 (two) times daily. [provider] Taking Active Self, Pharmacy Records  clopidogrel (PLAVIX) 75 MG tablet [id number] No Take 1 tablet (75 mg total) by mouth daily. Georgian Co M, PA-C Taking Active   dapagliflozin propanediol (FARXIGA) 5 MG TABS tablet [id number] No Take 1 tablet (5 mg total) by mouth daily  before breakfast. Anders Simmonds, PA-C Taking Active   diclofenac Sodium (VOLTAREN) 1 % GEL [id number] No Apply 2 g topically 4 (four) times daily.  Patient taking differently: Apply 2 g topically 4 (four) times daily as needed (joint pain).   Kerrin Champagne, MD Taking Active Self, Pharmacy Records  DULoxetine (CYMBALTA) 60 MG capsule [id number] No TAKE 1 CAPSULE (60 MG TOTAL) BY MOUTH DAILY (AM) Anders Simmonds, PA-C Taking Active   EPINEPHrine 0.3 mg/0.3 mL IJ SOAJ injection [id number] No 0.3 mg IM x 1 PRN for allergic reaction Marcine Matar, MD Taking Active Self, Pharmacy Records  ferrous sulfate ([name]) 325 (65 FE) MG tablet [id number] No TAKE ONE TABLET ( 325 MG ) BY MOUTH DAILY (AM) Anders Simmonds, PA-C Taking Active   fluticasone (FLONASE) 50 MCG/ACT nasal spray [id number] No Place 1 spray into both nostrils daily. Anders Simmonds, [place] Taking Active   Discontinued 04/16/11 0943 Glucagon 1 MG/0.2ML SOSY [id number] No 1 mg arrange subcutaneously as needed for severe symptomatic low blood sugar Marcine Matar, MD Taking Active Self, Pharmacy Records  glucose blood (ACCU-CHEK AVIVA PLUS) test strip [id number] No Use as instructed Marcine Matar, MD Taking Active Self, Pharmacy Records  glucose chewable tablet 12 g [id number]   Marcine Matar, MD  Active   glycopyrrolate (ROBINUL) 2 MG tablet [id number] No Take 2 mg by mouth 3 (three) times daily. [provider] Taking Active Self, Pharmacy Records  hydrOXYzine (ATARAX) 10 MG tablet [id number] No TAKE 1 TABLET IN THE MORNING AND NOON AND 2 TABLETS IN THE EVENING AS NEEDED. Anders Simmonds, PA-C Taking Active   Lancets (ACCU-CHEK MULTICLIX) lancets [id number] No Use as instructed Marcine Matar, MD Taking Active Self, Pharmacy Records  Lancets [facility] SOFT [facility]) lancets [id number] No Use as instructed Anders Simmonds, PA-C Taking Active Self, Pharmacy Records  Discontinued 04/16/11 [phone] (Error) loratadine  (CLARITIN) 10 MG tablet [id number] No TAKE 1 TABLET (10 MG TOTAL) BY MOUTH DAILY. (AM) Marcine Matar, MD Taking Active Self, Pharmacy Records  melatonin 5 MG TABS [id number] No Take 5 mg by mouth at bedtime. [provider] Taking Active Self, Pharmacy Records  metFORMIN (GLUCOPHAGE-XR) 750 MG 24 hr tablet [id number] No Take 1 tablet (750 mg total) by mouth daily with breakfast. Anders Simmonds, PA-C Taking Active   methocarbamol (ROBAXIN) 500 MG tablet [id number] No TAKE 1 TABLET (500 MG TOTAL) BY MOUTH EVERY 6 (SIX) HOURS AS NEEDED FOR MUSCLE SPASMS. Kirtland Bouchard, PA-C Taking Active   Methylnaltrexone Bromide 8 MG/0.4ML SOLN [id number] No Take 0.4 mLs by mouth as needed. Take once daily as needed Curley Spice, MD Taking Active   montelukast (SINGULAIR) 10 MG tablet [id number] No Take 10 mg by mouth daily. [provider] Taking Active Self, Pharmacy Records  Multiple Vitamins-Minerals (CENTRUM SILVER 50+WOMEN PO) [id number] No Take 1 tablet by mouth daily. [provider] Taking Active Self, Pharmacy Records  pantoprazole (PROTONIX) 40 MG tablet [id number] No Take 40 mg by mouth daily. [provider] Taking Active Self, Pharmacy Records  potassium chloride SA (KLOR-CON M) 20 MEQ tablet [id number] No Take 1 tablet (20 mEq total) by mouth daily. Anders Simmonds, PA-C Taking Active  Respiratory Therapy Supplies (FLUTTER) DEVI [id number] No Use after breathing treatment 4 times daily Storm Frisk, MD Taking Active Self, Pharmacy Records  [facility] 160-4.5 MCG/ACT inhaler [id number] No Inhale 1 puff into the lungs 2 (two) times daily. [provider] Taking Active Self, Pharmacy Records  tiotropium (SPIRIVA HANDIHALER) 18 MCG inhalation capsule [id number] No Place 1 capsule (18 mcg total) into inhaler and inhale daily. Georgian Co M, [place] Taking Active   triamcinolone cream (KENALOG) 0.1 % [id number] No Apply 1 Application topically 2 (two) times  daily as needed (irritation). Anders Simmonds, [place] Taking Active   valsartan-hydrochlorothiazide (DIOVAN-HCT) 160-12.5 MG tablet [id number] No TAKE ONE TABLET BY MOUTH ONCE DAILY (AM) Anders Simmonds, PA-C Taking Active             Patient Active Problem List   Diagnosis Date Noted   Status post revision of total replacement of right knee 05/12/2022   Personal history of colonic polyps 03/20/2022   Failed total knee, right, subsequent encounter 03/02/2022   Loose right total knee arthroplasty (HCC) 03/02/2022   Mixed stress and urge urinary incontinence 12/04/2021   Functional fecal incontinence 12/04/2021   IBS (irritable bowel syndrome) 11/19/2021   Spondylolisthesis, lumbar region    Other spondylosis with radiculopathy, lumbar region    Other secondary scoliosis, lumbar region    Fusion of spine of lumbar region 03/25/2021   Colon polyps 06/06/2020   Lupus (HCC) 06/06/2020   Allergic rhinitis due to animal (cat) (dog) hair and dander 04/08/2020   Allergic rhinitis due to pollen 04/08/2020   Food allergy 04/08/2020   Acute medial meniscus tear, left, subsequent encounter 03/21/2020   Chronic pain of left knee 02/15/2020   Paresthesia of skin 11/16/2019   History of total knee replacement, right 11/16/2019   Tobacco abuse 11/16/2019   Centrilobular emphysema (HCC) 08/10/2019   Incidental lung nodule, > 3mm and < 8mm 08/10/2019   OSA on CPAP 08/10/2019   Dyspnea on exertion 08/02/2019   Hyperlipidemia 08/02/2019   Lumbar radiculopathy 04/24/2019   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   Post laminectomy syndrome 02/23/2019   Abnormality of gait 02/23/2019   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   Unilateral primary osteoarthritis, left hip 12/28/2018   Lesion of skin of left ear 12/26/2018   Primary osteoarthritis of left hip 12/02/2018   Iron deficiency anemia 10/16/2018   Chronic pain syndrome 09/08/2018   Chronic pain of right knee 08/11/2018   Status post  lumbar laminectomy 07/15/2018   Peripheral arterial disease (HCC) 04/05/2018   Moderate persistent asthma without complication 06/29/2017   Environmental and seasonal allergies 06/29/2017   Controlled type 2 diabetes mellitus with diabetic polyneuropathy, without long-term current use of insulin (HCC) 06/29/2017   Perennial allergic rhinitis 04/08/2017   Sensorineural hearing loss (SNHL), bilateral 04/08/2017   Chronic pansinusitis 03/25/2017   Eustachian tube dysfunction, bilateral 03/25/2017   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   Herniation of lumbar intervertebral disc with radiculopathy 10/02/2016    Class: Chronic   Alopecia areata 08/19/2016   Chondromalacia of both patellae 06/03/2015    Class: Chronic   Spinal stenosis, lumbar region, with neurogenic claudication 06/03/2015   Tobacco use disorder 04/25/2015   DJD (degenerative joint disease) of knee 01/04/2015   Hemorrhoid 11/14/2014   Gout of big toe 07/19/2014   Essential hypertension 08/14/2013   Gastroesophageal reflux disease without esophagitis 08/14/2013   COPD (chronic obstructive pulmonary disease) (HCC) 04/17/2011    Conditions to be addressed/monitored per  PCP order:   food resources  There are no care plans that you recently modified to display for this patient.   Follow up:  Patient agrees to Care Plan and Follow-up.  Plan: The Managed Medicaid care management team will reach out to the patient again over the next 30-45 days.  Date/time of next scheduled Social Work care management/care coordination outreach:  09/14/22  [name], [name], [facility]  Managed [facility] Social Worker [phone]

## 2022-08-12 NOTE — Patient Instructions (Signed)
Visit Information  [name] was given information about Medicaid Managed Care team care coordination services as a part of their [facility] Community Plan Medicaid benefit. Stacy [name] verbally consented to engagement with the [facility] Managed Care team.   If you are experiencing a medical emergency, please call 911 or report to your local emergency department or urgent care.   If you have a non-emergency medical problem during routine business hours, please contact your provider's office and ask to speak with a nurse.   For questions related to your [facility], please call: [phone] or visit the homepage here: [email or web address]  If you would like to schedule transportation through your [facility], please call the following number at least 2 days in advance of your appointment: [phone]   Rides for urgent appointments can also be made after hours by calling Member Services.  Call the Behavioral Health Crisis Line at [phone], at any time, 24 hours a day, 7 days a week. If you are in danger or need immediate medical attention call 911.  If you would like help to quit smoking, call 1-800-QUIT-NOW ((312)688-4418) OR Espaol: 1-855-Djelo-Ya (1-324-401-0272) o para ms informacin haga clic aqu or Text READY to 536-644 to register via text  Stacy [name] - following are the goals we discussed in your visit today:   Goals Addressed   None       Social Worker will follow up in 30-45 days.   Stacy [name], Stacy [name], MHA [facility]  Managed Medicaid Social Worker [phone]   Following is a copy of your plan of care:  There are no care plans that you recently modified to display for this patient.

## 2022-08-20 DIAGNOSIS — T84012D Broken internal right knee prosthesis, subsequent encounter: Secondary | ICD-10-CM | POA: Diagnosis not present

## 2022-08-24 ENCOUNTER — Other Ambulatory Visit (INDEPENDENT_AMBULATORY_CARE_PROVIDER_SITE_OTHER): Payer: Medicaid Other

## 2022-08-24 ENCOUNTER — Encounter: Payer: Self-pay | Admitting: Orthopaedic Surgery

## 2022-08-24 ENCOUNTER — Ambulatory Visit (INDEPENDENT_AMBULATORY_CARE_PROVIDER_SITE_OTHER): Payer: Medicaid Other | Admitting: Orthopaedic Surgery

## 2022-08-24 DIAGNOSIS — Z96651 Presence of right artificial knee joint: Secondary | ICD-10-CM | POA: Diagnosis not present

## 2022-08-24 NOTE — Progress Notes (Signed)
The patient is now over 3 months status post a right knee revision arthroplasty of a failed aseptic loosening total knee.  She is doing well overall.  She says the left knee still has some weakness and gives way.  That is her native knee.  On exam her right knee has full flexion and full extension.  Ligamentously stable with no swelling.  Her left knee slightly hyperextends but does not feel unstable ligamentously and there is no swelling.  Standing AP and lateral of the right knee also shows the left knee on the AP view.  The AP view left knee looks normal.  The AP and lateral of the right revision knee show well-seated revision components with no complicating features.  At this point I do not need to see her back for 6 months.  She should still work on quad strengthening exercises for her left knee.  In 6 months and new AP and lateral of the right knee as needed.  If things worsen with the left knee she will let us know.  My next step for the left knee would be a MRI to evaluate the ligaments.

## 2022-08-31 ENCOUNTER — Other Ambulatory Visit: Payer: Self-pay | Admitting: Internal Medicine

## 2022-08-31 ENCOUNTER — Other Ambulatory Visit: Payer: Self-pay | Admitting: Physician Assistant

## 2022-08-31 DIAGNOSIS — F419 Anxiety disorder, unspecified: Secondary | ICD-10-CM

## 2022-08-31 DIAGNOSIS — J449 Chronic obstructive pulmonary disease, unspecified: Secondary | ICD-10-CM

## 2022-09-01 NOTE — Telephone Encounter (Signed)
Requested Prescriptions  Pending Prescriptions Disp Refills   loratadine (CLARITIN) 10 MG tablet [Pharmacy Med Name: LORATADINE 10 MG ORAL TABLET] 90 tablet 0    Sig: TAKE 1 TABLET (10 MG TOTAL) BY MOUTH DAILY. (AM)     Ear, Nose, and Throat:  Antihistamines 2 Passed - 08/31/2022 10:48 AM      Passed - Cr in normal range and within 360 days    Creat  Date Value Ref Range Status  03/11/2016 0.90 0.50 - 1.05 mg/dL Final    Comment:      For patients > or = 63 years of age: The upper reference limit for Creatinine is approximately 13% higher for people identified as African-American.      Creatinine, Ser  Date Value Ref Range Status  06/01/2022 0.93 0.44 - 1.00 mg/dL Final   Creatinine, POC  Date Value Ref Range Status  06/10/2016 200 mg/dL Final         Passed - Valid encounter within last 12 months    Recent Outpatient Visits           2 months ago Controlled type 2 diabetes mellitus with diabetic polyneuropathy, without long-term current use of insulin [facility])   [place] [facility] [place], [place], [place]   3 months ago Coarse tremors   [place] [facility] Jonah Blue B, MD   3 months ago Type 2 diabetes mellitus with peripheral neuropathy [facility])   [place] [facility] Jonah Blue B, MD   7 months ago Type 2 diabetes mellitus with peripheral neuropathy [facility])   [place] [facility] Marcine Matar, MD   9 months ago Administrative encounter   [facility] Marcine Matar, MD       Future Appointments             In 6 days Marcine Matar, MD [facility]   In 5 months Magnus Ivan, Vanita Panda, MD [facility]

## 2022-09-01 NOTE — Telephone Encounter (Signed)
Requested medication (s) are due for refill today: yes   Requested medication (s) are on the active medication list: yes   Last refill:  albuterol nebulizer- 06/17/22 # 360 ml 0 refills, hydroxyzine- 06/17/22 # 120 0 refills  Future visit scheduled: yes in 6 days   Notes to clinic:  no refills remain. Do you want to refill Rx now?     Requested Prescriptions  Pending Prescriptions Disp Refills   albuterol (PROVENTIL) (2.5 MG/3ML) 0.083% nebulizer solution [Pharmacy Med Name: ALBUTEROL SULFATE (2.5 MG/3ML) 0.083% INHALATION NEBULIZATION SOLUTION] 360 mL 0    Sig: USE ONE VIAL FOR UP TO 3 TIMES DAILY.     Pulmonology:  Beta Agonists 2 Failed - 08/31/2022 10:51 AM      Failed - Last BP in normal range    BP Readings from Last 1 Encounters:  07/07/22 (!) 141/71         Passed - Last Heart Rate in normal range    Pulse Readings from Last 1 Encounters:  07/06/22 97         Passed - Valid encounter within last 12 months    Recent Outpatient Visits           2 months ago Controlled type 2 diabetes mellitus with diabetic polyneuropathy, without long-term current use of insulin [facility])   [place] [facility] [place], [place], [place]   3 months ago Coarse tremors   [place] [facility] Jonah Blue B, MD   3 months ago Type 2 diabetes mellitus with peripheral neuropathy [facility])   [place] [facility] Jonah Blue B, MD   7 months ago Type 2 diabetes mellitus with peripheral neuropathy [facility])   [place] [facility] Marcine Matar, MD   9 months ago Administrative encounter   [place] [facility] Marcine Matar, MD       Future Appointments             In 6 days Marcine Matar, MD [facility]   In 5 months Magnus Ivan, Vanita Panda, MD  [place]             hydrOXYzine (ATARAX)  10 MG tablet [Pharmacy Med Name: HYDROXYZINE HCL 10 MG ORAL TABLET] 120 tablet 0    Sig: TAKE 1 TABLET IN THE MORNING AND NOON AND 2 TABLETS IN THE EVENING AS NEEDED.     Ear, Nose, and Throat:  Antihistamines 2 Passed - 08/31/2022 10:51 AM      Passed - Cr in normal range and within 360 days    Creat  Date Value Ref Range Status  03/11/2016 0.90 0.50 - 1.05 mg/dL Final    Comment:      For patients > or = 63 years of age: The upper reference limit for Creatinine is approximately 13% higher for people identified as African-American.      Creatinine, Ser  Date Value Ref Range Status  06/01/2022 0.93 0.44 - 1.00 mg/dL Final   Creatinine, POC  Date Value Ref Range Status  06/10/2016 200 mg/dL Final         Passed - Valid encounter within last 12 months    Recent Outpatient Visits           2 months ago Controlled type 2 diabetes mellitus with diabetic polyneuropathy, without long-term current use of insulin (HCC)  [facility] [place], [place], [place]   3 months ago Coarse tremors   [place] [facility] Jonah Blue B, MD   3 months ago Type 2 diabetes mellitus with peripheral neuropathy [facility])   [place] [facility] Jonah Blue B, MD   7 months ago Type 2 diabetes mellitus with peripheral neuropathy [facility])    [facility] Marcine Matar, MD   9 months ago Administrative encounter   [facility] Marcine Matar, MD       Future Appointments             In 6 days Marcine Matar, MD [facility]   In 5 months Magnus Ivan, Vanita Panda, MD [facility]

## 2022-09-02 DIAGNOSIS — F331 Major depressive disorder, recurrent, moderate: Secondary | ICD-10-CM | POA: Diagnosis not present

## 2022-09-03 DIAGNOSIS — R32 Unspecified urinary incontinence: Secondary | ICD-10-CM | POA: Diagnosis not present

## 2022-09-03 DIAGNOSIS — I10 Essential (primary) hypertension: Secondary | ICD-10-CM | POA: Diagnosis not present

## 2022-09-03 DIAGNOSIS — R159 Full incontinence of feces: Secondary | ICD-10-CM | POA: Diagnosis not present

## 2022-09-05 DIAGNOSIS — F331 Major depressive disorder, recurrent, moderate: Secondary | ICD-10-CM | POA: Diagnosis not present

## 2022-09-07 ENCOUNTER — Telehealth: Payer: Self-pay | Admitting: Internal Medicine

## 2022-09-07 ENCOUNTER — Ambulatory Visit: Payer: Medicaid Other | Attending: Internal Medicine | Admitting: Internal Medicine

## 2022-09-07 ENCOUNTER — Encounter: Payer: Self-pay | Admitting: Internal Medicine

## 2022-09-07 VITALS — BP 116/69 | HR 80 | Temp 98.4°F | Ht 66.0 in | Wt 155.0 lb

## 2022-09-07 DIAGNOSIS — E1159 Type 2 diabetes mellitus with other circulatory complications: Secondary | ICD-10-CM

## 2022-09-07 DIAGNOSIS — Z7984 Long term (current) use of oral hypoglycemic drugs: Secondary | ICD-10-CM

## 2022-09-07 DIAGNOSIS — J449 Chronic obstructive pulmonary disease, unspecified: Secondary | ICD-10-CM

## 2022-09-07 DIAGNOSIS — E1142 Type 2 diabetes mellitus with diabetic polyneuropathy: Secondary | ICD-10-CM

## 2022-09-07 DIAGNOSIS — F419 Anxiety disorder, unspecified: Secondary | ICD-10-CM | POA: Diagnosis not present

## 2022-09-07 DIAGNOSIS — R918 Other nonspecific abnormal finding of lung field: Secondary | ICD-10-CM

## 2022-09-07 DIAGNOSIS — I152 Hypertension secondary to endocrine disorders: Secondary | ICD-10-CM

## 2022-09-07 LAB — POCT GLYCOSYLATED HEMOGLOBIN (HGB A1C): HbA1c, POC (controlled diabetic range): 6.2 % (ref 0.0–7.0)

## 2022-09-07 LAB — GLUCOSE, POCT (MANUAL RESULT ENTRY): POC Glucose: 91 mg/dl (ref 70–99)

## 2022-09-07 MED ORDER — LORATADINE 10 MG PO TABS: 10.0000 mg | ORAL_TABLET | Freq: Every day | ORAL | 1 refills | Status: DC | PRN

## 2022-09-07 MED ORDER — EPINEPHRINE 0.3 MG/0.3ML IJ SOAJ: INTRAMUSCULAR | 1 refills | Status: AC

## 2022-09-07 MED ORDER — VALSARTAN-HYDROCHLOROTHIAZIDE 160-12.5 MG PO TABS: ORAL_TABLET | ORAL | 1 refills | Status: DC

## 2022-09-07 MED ORDER — HYDROXYZINE HCL 10 MG PO TABS: ORAL_TABLET | ORAL | 0 refills | Status: DC

## 2022-09-07 MED ORDER — METFORMIN HCL ER 750 MG PO TB24: 750.0000 mg | ORAL_TABLET | Freq: Every day | ORAL | 1 refills | Status: DC

## 2022-09-07 MED ORDER — POTASSIUM CHLORIDE CRYS ER 20 MEQ PO TBCR: 20.0000 meq | EXTENDED_RELEASE_TABLET | Freq: Every day | ORAL | 1 refills | Status: DC

## 2022-09-07 NOTE — Telephone Encounter (Signed)
Let [facility] imaging know that patient has been approved for CT scan of the lungs without contrast.  Approval date is up until October 20, 2022.  ZO#[id number], Code [id number].

## 2022-09-07 NOTE — Progress Notes (Signed)
Patient ID: Stacy [name] [facility], female    DOB: 1960/08/03  MRN: [id number]  CC: Diabetes (DM f/u )   Subjective: Stacy [name] is a 62 y.o. female who presents for chronic ds management. Her concerns today include:  hx of HTN, DM with neuropathy, aortic atherosclerosis, tob dep, HL, PAD, IDA, lupus, spinal stenosis with neurogenic claudication (s/p laminectomy 07/2018), COPD, OSA on CPAP,LS, OA knees, gout, recurrent sinusitis, receiving allergy shots from Dr. Young Berry.    CT chest:  ordered on her last routine f/u visit with me for f/u lung nodules.  She received letter of approval from [place] [facility].  It is approved until September 17.  Approval number on the letter is [id number]. She has cut back on smoking to about 3 cigarettes a day.  Hopes to quit soon.  DM: Results for orders placed or performed in visit on 09/07/22  POCT glycosylated hemoglobin (Hb A1C)  Result Value Ref Range   Hemoglobin A1C     HbA1c POC (<> result, manual entry)     HbA1c, POC (prediabetic range)     HbA1c, POC (controlled diabetic range) 6.2 0.0 - 7.0 %  POCT glucose (manual entry)  Result Value Ref Range   POC Glucose 91 70 - 99 mg/dl   *Note: Due to a large number of results and/or encounters for the requested time period, some results have not been displayed. A complete set of results can be found in Results Review.  Compliant with Metformin XR 750 mg and Jardiance 10 mg daily.  Doing well with eating habits.  She is doing home exercises trying to improve strength in the muscles around the right knee.  She had revision knee replacement several months ago.  She has been released from physical therapy.    HTN: Reports compliance with taking Diovan/HCTZ 160/12.5 mg once a day. She limits salt in the foods.  COPD: Breathing is good and under control with Symbicort and Spiriva.  She saw neurologist Dr. Allena Katz for the tremor in her left hand.  Patient states this has resolved.  Requests  refill on EpiPen, Claritin and hydroxyzine.  Patient Active Problem List   Diagnosis Date Noted   Status post revision of total replacement of right knee 05/12/2022   Personal history of colonic polyps 03/20/2022   Failed total knee, right, subsequent encounter 03/02/2022   Loose right total knee arthroplasty (HCC) 03/02/2022   Mixed stress and urge urinary incontinence 12/04/2021   Functional fecal incontinence 12/04/2021   IBS (irritable bowel syndrome) 11/19/2021   Spondylolisthesis, lumbar region    Other spondylosis with radiculopathy, lumbar region    Other secondary scoliosis, lumbar region    Fusion of spine of lumbar region 03/25/2021   Colon polyps 06/06/2020   Lupus (HCC) 06/06/2020   Allergic rhinitis due to animal (cat) (dog) hair and dander 04/08/2020   Allergic rhinitis due to pollen 04/08/2020   Food allergy 04/08/2020   Acute medial meniscus tear, left, subsequent encounter 03/21/2020   Chronic pain of left knee 02/15/2020   Paresthesia of skin 11/16/2019   History of total knee replacement, right 11/16/2019   Tobacco abuse 11/16/2019   Centrilobular emphysema (HCC) 08/10/2019   Incidental lung nodule, > 3mm and < 8mm 08/10/2019   OSA on CPAP 08/10/2019   Dyspnea on exertion 08/02/2019   Hyperlipidemia 08/02/2019   Lumbar radiculopathy 04/24/2019   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   Post laminectomy syndrome 02/23/2019   Abnormality of  gait 02/23/2019   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   Unilateral primary osteoarthritis, left hip 12/28/2018   Lesion of skin of left ear 12/26/2018   Primary osteoarthritis of left hip 12/02/2018   Iron deficiency anemia 10/16/2018   Chronic pain syndrome 09/08/2018   Chronic pain of right knee 08/11/2018   Status post lumbar laminectomy 07/15/2018   Peripheral arterial disease (HCC) 04/05/2018   Moderate persistent asthma without complication 06/29/2017   Environmental and seasonal allergies 06/29/2017    Controlled type 2 diabetes mellitus with diabetic polyneuropathy, without long-term current use of insulin (HCC) 06/29/2017   Perennial allergic rhinitis 04/08/2017   Sensorineural hearing loss (SNHL), bilateral 04/08/2017   Chronic pansinusitis 03/25/2017   Eustachian tube dysfunction, bilateral 03/25/2017   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   Herniation of lumbar intervertebral disc with radiculopathy 10/02/2016    Class: Chronic   Alopecia areata 08/19/2016   Chondromalacia of both patellae 06/03/2015    Class: Chronic   Spinal stenosis, lumbar region, with neurogenic claudication 06/03/2015   Tobacco use disorder 04/25/2015   DJD (degenerative joint disease) of knee 01/04/2015   Hemorrhoid 11/14/2014   Gout of big toe 07/19/2014   Essential hypertension 08/14/2013   Gastroesophageal reflux disease without esophagitis 08/14/2013   COPD (chronic obstructive pulmonary disease) (HCC) 04/17/2011     Current Outpatient Medications on File Prior to Visit  Medication Sig Dispense Refill   albuterol (PROVENTIL) (2.5 MG/3ML) 0.083% nebulizer solution USE ONE VIAL FOR UP TO 3 TIMES DAILY. 360 mL 0   allopurinol (ZYLOPRIM) 100 MG tablet TAKE 1 TABLET (100 MG TOTAL) BY MOUTH DAILY.(AM) 90 tablet 3   atorvastatin (LIPITOR) 40 MG tablet TAKE 1 TABLET (40 MG TOTAL) BY MOUTH DAILY. (BEDTIME) 90 tablet 1   Blood Glucose Monitoring Suppl (ACCU-CHEK AVIVA PLUS) w/Device KIT 1 each by Does not apply route 3 (three) times daily. 1 kit 0   celecoxib (CELEBREX) 200 MG capsule Take 200 mg by mouth 2 (two) times daily.     clopidogrel (PLAVIX) 75 MG tablet Take 1 tablet (75 mg total) by mouth daily. 30 tablet 5   dapagliflozin propanediol (FARXIGA) 5 MG TABS tablet Take 1 tablet (5 mg total) by mouth daily before breakfast. 90 tablet 1   diclofenac Sodium (VOLTAREN) 1 % GEL Apply 2 g topically 4 (four) times daily. (Patient taking differently: Apply 2 g topically 4 (four) times daily as needed (joint pain).)  350 g 5   DULoxetine (CYMBALTA) 60 MG capsule TAKE 1 CAPSULE (60 MG TOTAL) BY MOUTH DAILY (AM) 30 capsule 6   ferrous sulfate ([name]) 325 (65 FE) MG tablet TAKE ONE TABLET ( 325 MG ) BY MOUTH DAILY (AM) 100 tablet 1   fluticasone (FLONASE) 50 MCG/ACT nasal spray Place 1 spray into both nostrils daily. 16 g 6   Glucagon 1 MG/0.2ML SOSY 1 mg arrange subcutaneously as needed for severe symptomatic low blood sugar 0.2 mL 0   glucose blood (ACCU-CHEK AVIVA PLUS) test strip Use as instructed 100 each 12   glycopyrrolate (ROBINUL) 2 MG tablet Take 2 mg by mouth 3 (three) times daily.     Lancets (ACCU-CHEK MULTICLIX) lancets Use as instructed 100 each 12   Lancets (ACCU-CHEK SOFT TOUCH) lancets Use as instructed 100 each 12   melatonin 5 MG TABS Take 5 mg by mouth at bedtime.     methocarbamol (ROBAXIN) 500 MG tablet TAKE 1 TABLET (500 MG TOTAL) BY MOUTH EVERY 6 (SIX) HOURS AS NEEDED FOR MUSCLE SPASMS. 40  tablet 1   Methylnaltrexone Bromide 8 MG/0.4ML SOLN Take 0.4 mLs by mouth as needed. Take once daily as needed 2.1 mL 0   montelukast (SINGULAIR) 10 MG tablet Take 10 mg by mouth daily.     Multiple Vitamins-Minerals (CENTRUM SILVER 50+WOMEN PO) Take 1 tablet by mouth daily.     pantoprazole (PROTONIX) 40 MG tablet Take 40 mg by mouth daily.     Respiratory Therapy Supplies (FLUTTER) DEVI Use after breathing treatment 4 times daily 1 each 0   SYMBICORT 160-4.5 MCG/ACT inhaler Inhale 1 puff into the lungs 2 (two) times daily.     tiotropium (SPIRIVA HANDIHALER) 18 MCG inhalation capsule Place 1 capsule (18 mcg total) into inhaler and inhale daily. 30 capsule 1   triamcinolone cream (KENALOG) 0.1 % Apply 1 Application topically 2 (two) times daily as needed (irritation). 45 g 1   [DISCONTINUED] Fluticasone-Salmeterol (ADVAIR) 500-50 MCG/DOSE AEPB Inhale 1 puff into the lungs every 12 (twelve) hours.       [DISCONTINUED] lisinopril (PRINIVIL,ZESTRIL) 40 MG tablet Take 40 mg by mouth daily.        Current Facility-Administered Medications on File Prior to Visit  Medication Dose Route Frequency Provider Last Rate Last Admin   glucose chewable tablet 12 g  3 tablet Oral Once Marcine Matar, MD        Allergies  Allergen Reactions   Other Shortness Of Breath    UNSPECIFIED AGENTS Allergic to perfumes and cleaning products   Shellfish Allergy Anaphylaxis    Per allergy test.   Ace Inhibitors Cough and Other (See Comments)        Celebrex [Celecoxib]      upset stomach  Pt is taking med*   [employer] Other (See Comments)   Aspirin Nausea Only and Other (See Comments)    stomach upset  Other reaction(s): Nausea Only    Social History   Socioeconomic History   Marital status: Married    Spouse name: Not on file   Number of children: 2   Years of education: Not on file   Highest education level: Not on file  Occupational History   Occupation: unemployed    Employer: UNEMPLOYED  Tobacco Use   Smoking status: Former   Smokeless tobacco: Never  Advertising account planner   Vaping status: Never Used  Substance and Sexual Activity   Alcohol use: No   Drug use: No   Sexual activity: Yes    Birth control/protection: Surgical, Post-menopausal    Comment: tubal ligation  Other Topics Concern   Not on file  Social History Narrative   Left Handed    Lives in a two story apartment. With husband   Drinks Caffeine 1 cup   retired   International aid/development worker of [employer] investment [employer] Strain: Not on file  Food Insecurity: No Food Insecurity (03/09/2022)   Hunger Vital Sign    Worried About Running Out of Food in the Last Year: Never true    Ran Out of Food in the Last Year: Never true  Transportation Needs: No Transportation Needs (07/03/2022)   PRAPARE - Administrator, Civil Service (Medical): No    Lack of Transportation (Non-Medical): No  Physical Activity: Not on file  Stress: Not on file  Social Connections: Not on file  Intimate Partner  Violence: Not At Risk (06/10/2022)   Humiliation, Afraid, Rape, and Kick questionnaire    Fear of Current or Ex-Partner: No    Emotionally Abused: No  Physically Abused: No    Sexually Abused: No    Family History  Problem Relation Age of Onset   Hypertension Father    Cancer Father    Heart disease Mother    Asthma Son        had as a child   Heart disease Sister    Breast cancer Sister    Hypertension Brother     Past Surgical History:  Procedure Laterality Date   BACK SURGERY     feb 21, 23   CHOLECYSTECTOMY     COLONOSCOPY     ENDOMETRIAL ABLATION  10/2010   HERNIA REPAIR     umbicial hernia   JOINT REPLACEMENT Left 03/14/2019   Dr. Magnus Ivan hip   KNEE ARTHROSCOPY Left    06/07/2017 Dr. August [facility]   KNEE ARTHROSCOPY Left 03/21/2020   Procedure: LEFT KNEE ARTHROSCOPY WITH PARTIAL MEDIAL MENISCECTOMY;  Surgeon: [name], MD;  Location: [facility];  Service: Orthopedics;  Laterality: Left;   KNEE CLOSED REDUCTION Right 12/06/2015   Procedure: CLOSED MANIPULATION RIGHT KNEE;  Surgeon: Kerrin Champagne, MD;  Location: [facility];  Service: Orthopedics;  Laterality: Right;   KNEE CLOSED REDUCTION Right 01/17/2016   Procedure: CLOSED MANIPULATION RIGHT KNEE;  Surgeon: Kerrin Champagne, MD;  Location: [facility];  Service: Orthopedics;  Laterality: Right;   KNEE JOINT MANIPULATION Right 12/06/2015   LACRIMAL TUBE INSERTION Bilateral 03/01/2019   Procedure: LACRIMAL TUBE INSERTION;  Surgeon: Aura Camps, MD;  Location: [facility];  Service: Ophthalmology;  Laterality: Bilateral;   LACRIMAL TUBE REMOVAL Bilateral 05/03/2019   Procedure: BILATERAL NASOLACRIMAL DUCT PROBING, IIRIGATION AND TUBE REMOVAL BOTH EYES;  Surgeon: Aura Camps, MD;  Location: [facility];  Service: Ophthalmology;  Laterality: Bilateral;   LUMBAR LAMINECTOMY/DECOMPRESSION MICRODISCECTOMY N/A 06/03/2015   Procedure: Bilateral lateral recess  decompression L2-3, L3-4, L4-5;  Surgeon: Kerrin Champagne, MD;  Location: [facility];  Service: Orthopedics;  Laterality: N/A;   LUMBAR LAMINECTOMY/DECOMPRESSION MICRODISCECTOMY N/A 10/02/2016   Procedure: Right L5-S1 Lateral Recess Decompression  microdiscectomy;  Surgeon: Kerrin Champagne, MD;  Location: [facility] OR;  Service: Orthopedics;  Laterality: N/A;   LUMBAR LAMINECTOMY/DECOMPRESSION MICRODISCECTOMY N/A 07/15/2018   Procedure: LEFT L3-4 MICRODISCECTOMY;  Surgeon: Kerrin Champagne, MD;  Location: [facility] OR;  Service: Orthopedics;  Laterality: N/A;   svd      x 2   TEAR DUCT PROBING Bilateral 03/01/2019   Procedure: TEAR DUCT PROBING WITH IRRIGATION;  Surgeon: Aura Camps, MD;  Location: [facility];  Service: Ophthalmology;  Laterality: Bilateral;   TOTAL HIP ARTHROPLASTY Left 03/14/2019   Procedure: LEFT TOTAL HIP ARTHROPLASTY ANTERIOR APPROACH;  Surgeon: [name], MD;  Location: [facility];  Service: Orthopedics;  Laterality: Left;   TOTAL KNEE ARTHROPLASTY Right 09/06/2015   Procedure: RIGHT TOTAL KNEE ARTHROPLASTY;  Surgeon: Kerrin Champagne, MD;  Location: [facility];  Service: Orthopedics;  Laterality: Right;   TOTAL KNEE REVISION Right 05/12/2022   Procedure: RIGHT TOTAL KNEE REVISION ARTHROPLASTY;  Surgeon: [name], MD;  Location: [facility];  Service: Orthopedics;  Laterality: Right;   TUBAL LIGATION     UPPER GASTROINTESTINAL ENDOSCOPY  04/28/2011    ROS: Review of Systems Negative except as stated above  PHYSICAL EXAM: BP 116/69 (BP Location: Left Arm, Patient Position: Sitting, Cuff Size: Normal)   Pulse 80   Temp 98.4 F (36.9 C) (Oral)   Ht 5\' 6"  (1.676 m)  Wt 155 lb (70.3 kg)   LMP 09/01/2010   SpO2 99%   BMI 25.02 kg/m   Physical Exam  General appearance - alert, well appearing, older African-American female and in no distress.  She is walking without use of an assistive device. Mental status - normal mood, behavior, speech, dress, motor  activity, and thought processes Chest - clear to auscultation, no wheezes, rales or rhonchi, symmetric air entry Heart - normal rate, regular rhythm, normal S1, S2, no murmurs, rubs, clicks or gallops Extremities - peripheral pulses normal, no pedal edema, no clubbing or cyanosis      Latest Ref Rng & Units 06/17/2022   10:03 AM 06/01/2022   11:32 AM 05/29/2022    3:44 PM  CMP  Glucose 70 - 99 mg/dL  409  97   BUN 8 - 23 mg/dL  11  9   Creatinine 8.11 - 1.00 mg/dL  9.14  7.82   Sodium 956 - 145 mmol/L  131  134   Potassium 3.5 - 5.1 mmol/L  4.1  4.8   Chloride 98 - 111 mmol/L  94  94   CO2 22 - 32 mmol/L  24  24   Calcium 8.7 - 10.3 mg/dL 9.6  9.7  21.3   Total Protein 6.5 - 8.1 g/dL  7.7  7.8   Total Bilirubin 0.3 - 1.2 mg/dL  0.6  0.4   Alkaline Phos 38 - 126 U/L  120  159   AST 15 - 41 U/L  24  19   ALT 0 - 44 U/L  16  16    Lipid Panel     Component Value Date/Time   CHOL 132 01/05/2022 1215   TRIG 116 01/05/2022 1215   HDL 47 01/05/2022 1215   CHOLHDL 2.8 01/05/2022 1215   CHOLHDL 4.2 07/19/2014 1202   VLDL 24 07/19/2014 1202   LDLCALC 64 01/05/2022 1215    CBC    Component Value Date/Time   WBC 7.4 06/01/2022 1132   RBC 4.08 06/01/2022 1132   HGB 11.0 (L) 06/01/2022 1132   HGB 11.3 05/01/2021 1526   HCT 33.5 (L) 06/01/2022 1132   HCT 34.7 05/01/2021 1526   PLT 449 (H) 06/01/2022 1132   PLT 297 05/01/2021 1526   MCV 82.1 06/01/2022 1132   MCV 81 05/01/2021 1526   MCH 27.0 06/01/2022 1132   MCHC 32.8 06/01/2022 1132   RDW 14.6 06/01/2022 1132   RDW 15.7 (H) 05/01/2021 1526   LYMPHSABS 2.4 06/01/2022 1132   LYMPHSABS 4.1 (H) 10/14/2018 1704   MONOABS 0.5 06/01/2022 1132   EOSABS 0.1 06/01/2022 1132   EOSABS 0.1 10/14/2018 1704   BASOSABS 0.0 06/01/2022 1132   BASOSABS 0.0 10/14/2018 1704    ASSESSMENT AND PLAN: 1. Controlled type 2 diabetes mellitus with diabetic polyneuropathy, without long-term current use of insulin (HCC) At goal.  Continue  metformin and Farxiga.  Continue healthy eating habits and trying to move as much as she can. - POCT glycosylated hemoglobin (Hb A1C) - POCT glucose (manual entry) - metFORMIN (GLUCOPHAGE-XR) 750 MG 24 hr tablet; Take 1 tablet (750 mg total) by mouth daily with breakfast.  Dispense: 90 tablet; Refill: 1  2. Hypertension associated with type 2 diabetes mellitus (HCC) At goal.  Continue Diovan/HCTZ 160/12.5 mg daily - potassium chloride SA (KLOR-CON M) 20 MEQ tablet; Take 1 tablet (20 mEq total) by mouth daily.  Dispense: 90 tablet; Refill: 1 - valsartan-hydrochlorothiazide (DIOVAN-HCT) 160-12.5 MG tablet; TAKE ONE TABLET  BY MOUTH ONCE DAILY (AM)  Dispense: 90 tablet; Refill: 1  3. Chronic obstructive pulmonary disease, unspecified COPD type (HCC) Stable on Spiriva and Symbicort  4. Anxiety - hydrOXYzine (ATARAX) 10 MG tablet; TAKE 1 TABLET IN THE MORNING AND NOON AND 2 TABLETS IN THE EVENING AS NEEDED.  Dispense: 120 tablet; Refill: 0  5. Lung nodules I will have my CMA call [facility] to let them know that she has been approved for CT without contrast.    Patient was given the opportunity to ask questions.  Patient verbalized understanding of the plan and was able to repeat key elements of the plan.   This documentation was completed using Paediatric nurse.  Any transcriptional errors are unintentional.  Orders Placed This Encounter  Procedures   POCT glycosylated hemoglobin (Hb A1C)   POCT glucose (manual entry)     Requested Prescriptions   Signed Prescriptions Disp Refills   loratadine (CLARITIN) 10 MG tablet 90 tablet 1    Sig: Take 1 tablet (10 mg total) by mouth daily as needed for allergies.   hydrOXYzine (ATARAX) 10 MG tablet 120 tablet 0    Sig: TAKE 1 TABLET IN THE MORNING AND NOON AND 2 TABLETS IN THE EVENING AS NEEDED.   EPINEPHrine 0.3 mg/0.3 mL IJ SOAJ injection 1 each 1    Sig: 0.3 mg IM x 1 PRN for allergic reaction   metFORMIN  (GLUCOPHAGE-XR) 750 MG 24 hr tablet 90 tablet 1    Sig: Take 1 tablet (750 mg total) by mouth daily with breakfast.   potassium chloride SA (KLOR-CON M) 20 MEQ tablet 90 tablet 1    Sig: Take 1 tablet (20 mEq total) by mouth daily.   valsartan-hydrochlorothiazide (DIOVAN-HCT) 160-12.5 MG tablet 90 tablet 1    Sig: TAKE ONE TABLET BY MOUTH ONCE DAILY (AM)    Return in about 4 months (around 01/07/2023).  Jonah Blue, MD, FACP

## 2022-09-08 NOTE — Telephone Encounter (Signed)
Called and spoke to [place] at [facility]. Informed of approval for CT scan o the lungs without contrast. Informed of approval dates, PA #, and code. Taron expressed verbal understanding of all discussed. No further questions at this time.

## 2022-09-09 DIAGNOSIS — F331 Major depressive disorder, recurrent, moderate: Secondary | ICD-10-CM | POA: Diagnosis not present

## 2022-09-14 ENCOUNTER — Other Ambulatory Visit: Payer: Medicaid Other

## 2022-09-14 NOTE — Patient Outreach (Signed)
  Medicaid Managed Care   Unsuccessful Outreach Note  09/14/2022 Name: Stacy [name] [facility] MRN: [id number] DOB: 11-Jan-1961  Referred by: Marcine Matar, MD Reason for referral : High Risk Managed Medicaid (MM Social work unsuccessful telephone outreach )   An unsuccessful telephone outreach was attempted today. The patient was referred to the case management team for assistance with care management and care coordination.   Follow Up Plan: A HIPAA compliant phone message was left for the patient providing contact information and requesting a return call.   Abelino Derrick, MHA [facility]  Managed [facility] Social Worker [phone]

## 2022-09-14 NOTE — Patient Instructions (Signed)
  Medicaid Managed Care   Unsuccessful Outreach Note  09/14/2022 Name: Stacy [name] [facility] MRN: [id number] DOB: 11-Jan-1961  Referred by: Marcine Matar, MD Reason for referral : High Risk Managed Medicaid (MM Social work unsuccessful telephone outreach )   An unsuccessful telephone outreach was attempted today. The patient was referred to the case management team for assistance with care management and care coordination.   Follow Up Plan: A HIPAA compliant phone message was left for the patient providing contact information and requesting a return call.   Abelino Derrick, MHA [facility]  Managed [facility] Social Worker [phone]

## 2022-09-16 DIAGNOSIS — F331 Major depressive disorder, recurrent, moderate: Secondary | ICD-10-CM | POA: Diagnosis not present

## 2022-09-20 DIAGNOSIS — T84012D Broken internal right knee prosthesis, subsequent encounter: Secondary | ICD-10-CM | POA: Diagnosis not present

## 2022-09-23 ENCOUNTER — Ambulatory Visit
Admission: RE | Admit: 2022-09-23 | Discharge: 2022-09-23 | Disposition: A | Discharge status: Home / Self Care | Payer: Medicaid Other | Source: Ambulatory Visit | Attending: Internal Medicine | Admitting: Internal Medicine

## 2022-09-23 DIAGNOSIS — I700 Atherosclerosis of aorta: Secondary | ICD-10-CM | POA: Diagnosis not present

## 2022-09-23 DIAGNOSIS — R918 Other nonspecific abnormal finding of lung field: Secondary | ICD-10-CM

## 2022-09-23 DIAGNOSIS — J439 Emphysema, unspecified: Secondary | ICD-10-CM | POA: Diagnosis not present

## 2022-09-23 DIAGNOSIS — F331 Major depressive disorder, recurrent, moderate: Secondary | ICD-10-CM | POA: Diagnosis not present

## 2022-09-30 DIAGNOSIS — F331 Major depressive disorder, recurrent, moderate: Secondary | ICD-10-CM | POA: Diagnosis not present

## 2022-10-04 DIAGNOSIS — R32 Unspecified urinary incontinence: Secondary | ICD-10-CM | POA: Diagnosis not present

## 2022-10-04 DIAGNOSIS — R159 Full incontinence of feces: Secondary | ICD-10-CM | POA: Diagnosis not present

## 2022-10-04 DIAGNOSIS — I10 Essential (primary) hypertension: Secondary | ICD-10-CM | POA: Diagnosis not present

## 2022-10-07 ENCOUNTER — Other Ambulatory Visit: Payer: Medicaid Other

## 2022-10-07 DIAGNOSIS — F331 Major depressive disorder, recurrent, moderate: Secondary | ICD-10-CM | POA: Diagnosis not present

## 2022-10-07 NOTE — Patient Instructions (Signed)
Visit Information  Stacy [name] was given information about Medicaid Managed Care team care coordination services as a part of their [facility] Community Plan Medicaid benefit. Stacy [name] verbally consented to engagement with the [facility] Managed Care team.   If you are experiencing a medical emergency, please call 911 or report to your local emergency department or urgent care.   If you have a non-emergency medical problem during routine business hours, please contact your provider's office and ask to speak with a nurse.   For questions related to your [facility], please call: [phone] or visit the homepage here: [email or web address]  If you would like to schedule transportation through your [facility], please call the following number at least 2 days in advance of your appointment: [phone]   Rides for urgent appointments can also be made after hours by calling Member Services.  Call the Behavioral Health Crisis Line at [phone], at any time, 24 hours a day, 7 days a week. If you are in danger or need immediate medical attention call 911.  If you would like help to quit smoking, call 1-800-QUIT-NOW (650-410-2933) OR Espaol: 1-855-Djelo-Ya (1-324-401-0272) o para ms informacin haga clic aqu or Text READY to 536-644 to register via text  Stacy [name] - following are the goals we discussed in your visit today:   Goals Addressed   None      Social Worker will follow up on 11/06/22.  [name], [name], MHA [facility]  Managed Medicaid Social Worker [phone]   Following is a copy of your plan of care:  There are no care plans that you recently modified to display for this patient.

## 2022-10-07 NOTE — Patient Outreach (Signed)
Medicaid Managed Care Social Work Note  10/07/2022 Name:  Stacy [name] [facility] MRN:  [id number] DOB:  24-Aug-1960  Stacy [name] is an 62 y.o. year old female who is a primary patient of Marcine Matar, MD.  The Medicaid Managed Care Coordination team was consulted for assistance with:   food resources  Stacy [name] was given information about Medicaid Managed Care Coordination team services today. Stacy [name] Patient agreed to services and verbal consent obtained.  Engaged with patient  for by telephone forfollow up visit in response to referral for case management and/or care coordination services.   Assessments/Interventions:  Review of past medical history, allergies, medications, health status, including review of consultants reports, laboratory and other test data, was performed as part of comprehensive evaluation and provision of chronic care management services.  SDOH: (Social Determinant of Health) assessments and interventions performed: SDOH Interventions    Flowsheet Row Patient Outreach Telephone from 07/03/2022 in [place] POPULATION HEALTH DEPARTMENT Patient Outreach Telephone from 05/20/2022 in [place] POPULATION HEALTH DEPARTMENT Patient Outreach Telephone from 03/09/2022 in [place] POPULATION HEALTH DEPARTMENT Patient Outreach Telephone from 01/07/2022 in [place] POPULATION HEALTH DEPARTMENT Patient Outreach Telephone from 10/21/2021 in [facility] Care Coordination  SDOH Interventions       Food Insecurity Interventions -- -- Intervention Not Indicated -- --  Housing Interventions -- -- -- -- Intervention Not Indicated  Transportation Interventions Intervention Not Indicated Payor Benefit Intervention Not Indicated -- Intervention Not Indicated  Alcohol Usage Interventions -- -- -- Intervention Not Indicated (Score <7) --     BSW completed a telephone outreach with patient, she states she is taking everything one day at  a time since she has had her surgery. She still has not received the food bag, but was able to make it to a food pantry. No additional resources are needed at this time.  Advanced Directives Status:  Not addressed in this encounter.  Care Plan                 Allergies  Allergen Reactions   Other Shortness Of Breath    UNSPECIFIED AGENTS Allergic to perfumes and cleaning products   Shellfish Allergy Anaphylaxis    Per allergy test.   Ace Inhibitors Cough and Other (See Comments)        Celebrex [Celecoxib]      upset stomach  Pt is taking med*   Stacy [name] Other (See Comments)   Aspirin Nausea Only and Other (See Comments)    stomach upset  Other reaction(s): Nausea Only    Medications Reviewed Today   Medications were not reviewed in this encounter     Patient Active Problem List   Diagnosis Date Noted   Status post revision of total replacement of right knee 05/12/2022   Personal history of colonic polyps 03/20/2022   Failed total knee, right, subsequent encounter 03/02/2022   Loose right total knee arthroplasty (HCC) 03/02/2022   Mixed stress and urge urinary incontinence 12/04/2021   Functional fecal incontinence 12/04/2021   IBS (irritable bowel syndrome) 11/19/2021   Spondylolisthesis, lumbar region    Other spondylosis with radiculopathy, lumbar region    Other secondary scoliosis, lumbar region    Fusion of spine of lumbar region 03/25/2021   Colon polyps 06/06/2020   Lupus (HCC) 06/06/2020   Allergic rhinitis due to animal (cat) (dog) hair and dander 04/08/2020   Allergic rhinitis due to pollen 04/08/2020   Food allergy 04/08/2020  Acute medial meniscus tear, left, subsequent encounter 03/21/2020   Chronic pain of left knee 02/15/2020   Paresthesia of skin 11/16/2019   History of total knee replacement, right 11/16/2019   Tobacco abuse 11/16/2019   Centrilobular emphysema (HCC) 08/10/2019   Incidental lung nodule, > 3mm and < 8mm  08/10/2019   OSA on CPAP 08/10/2019   Dyspnea on exertion 08/02/2019   Hyperlipidemia 08/02/2019   Lumbar radiculopathy 04/24/2019   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   Post laminectomy syndrome 02/23/2019   Abnormality of gait 02/23/2019   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   Unilateral primary osteoarthritis, left hip 12/28/2018   Lesion of skin of left ear 12/26/2018   Primary osteoarthritis of left hip 12/02/2018   Iron deficiency anemia 10/16/2018   Chronic pain syndrome 09/08/2018   Chronic pain of right knee 08/11/2018   Status post lumbar laminectomy 07/15/2018   Peripheral arterial disease (HCC) 04/05/2018   Moderate persistent asthma without complication 06/29/2017   Environmental and seasonal allergies 06/29/2017   Controlled type 2 diabetes mellitus with diabetic polyneuropathy, without long-term current use of insulin (HCC) 06/29/2017   Perennial allergic rhinitis 04/08/2017   Sensorineural hearing loss (SNHL), bilateral 04/08/2017   Chronic pansinusitis 03/25/2017   Eustachian tube dysfunction, bilateral 03/25/2017   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   Herniation of lumbar intervertebral disc with radiculopathy 10/02/2016    Class: Chronic   Alopecia areata 08/19/2016   Chondromalacia of both patellae 06/03/2015    Class: Chronic   Spinal stenosis, lumbar region, with neurogenic claudication 06/03/2015   Tobacco use disorder 04/25/2015   DJD (degenerative joint disease) of knee 01/04/2015   Hemorrhoid 11/14/2014   Gout of big toe 07/19/2014   Essential hypertension 08/14/2013   Gastroesophageal reflux disease without esophagitis 08/14/2013   COPD (chronic obstructive pulmonary disease) (HCC) 04/17/2011    Conditions to be addressed/monitored per PCP order:   food resources  There are no care plans that you recently modified to display for this patient.   Follow up:  Patient agrees to Care Plan and Follow-up.  Plan: The Managed Medicaid care  management team will reach out to the patient again over the next 30 days.  Date/time of next scheduled Social Work care management/care coordination outreach:  11/06/22  [name], [name], [facility]  Managed [facility] Social Worker [phone]

## 2022-10-13 ENCOUNTER — Ambulatory Visit (INDEPENDENT_AMBULATORY_CARE_PROVIDER_SITE_OTHER): Payer: Medicaid Other | Admitting: Podiatry

## 2022-10-13 DIAGNOSIS — B351 Tinea unguium: Secondary | ICD-10-CM

## 2022-10-13 DIAGNOSIS — L84 Corns and callosities: Secondary | ICD-10-CM

## 2022-10-13 DIAGNOSIS — M79674 Pain in right toe(s): Secondary | ICD-10-CM | POA: Diagnosis not present

## 2022-10-13 DIAGNOSIS — M79675 Pain in left toe(s): Secondary | ICD-10-CM | POA: Diagnosis not present

## 2022-10-13 NOTE — Progress Notes (Addendum)
Subjective:  Patient ID: Stacy [name], female    DOB: [birth year],  MRN: [id number]  Stacy [name] presents to clinic today for:  Chief Complaint  Patient presents with   Nail Problem    [facility]   Patient notes nails are thick, discolored, elongated and painful in shoegear when trying to ambulate.  Notes calluses on the great toes bilateral  PCP is Marcine Matar, MD.  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   Asthma    COPD (chronic obstructive pulmonary disease) (HCC)    Depression    Diabetes mellitus    Type II   GERD (gastroesophageal reflux disease)    Pt on Protonix daily   Glaucoma    Gout    Headache(784.0)    otc meds prn   Hyperlipidemia    Hypertension    Irritable bowel syndrome 11/19/2010   Neuropathy    Pneumonia YRS AGO   Restless legs    Shortness of breath    07/14/2018- uses  4 times a day   Sleep apnea     Past Surgical History:  Procedure Laterality Date   BACK SURGERY     feb 21, 23   CHOLECYSTECTOMY     COLONOSCOPY     ENDOMETRIAL ABLATION  10/2010   HERNIA REPAIR     umbicial hernia   JOINT REPLACEMENT Left 03/14/2019   Dr. Magnus Ivan hip   KNEE ARTHROSCOPY Left    06/07/2017 Dr. August [facility]   KNEE ARTHROSCOPY Left 03/21/2020   Procedure: LEFT KNEE ARTHROSCOPY WITH PARTIAL MEDIAL MENISCECTOMY;  Surgeon: [name], MD;  Location: [facility];  Service: Orthopedics;  Laterality: Left;   KNEE CLOSED REDUCTION Right 12/06/2015   Procedure: CLOSED MANIPULATION RIGHT KNEE;  Surgeon: Kerrin Champagne, MD;  [facility];  Service: Orthopedics;  Laterality: Right;   KNEE CLOSED REDUCTION Right 01/17/2016   Procedure: CLOSED MANIPULATION RIGHT KNEE;  Surgeon: Kerrin Champagne, MD;  Location: [facility];  Service: Orthopedics;  Laterality: Right;   KNEE JOINT MANIPULATION  Right 12/06/2015   LACRIMAL TUBE INSERTION Bilateral 03/01/2019   Procedure: LACRIMAL TUBE INSERTION;  Surgeon: Aura Camps, MD;  Location: [facility];  Service: Ophthalmology;  Laterality: Bilateral;   LACRIMAL TUBE REMOVAL Bilateral 05/03/2019   Procedure: BILATERAL NASOLACRIMAL DUCT PROBING, IIRIGATION AND TUBE REMOVAL BOTH EYES;  Surgeon: Aura Camps, MD;  Location: [facility];  Service: Ophthalmology;  Laterality: Bilateral;   LUMBAR LAMINECTOMY/DECOMPRESSION MICRODISCECTOMY N/A 06/03/2015   Procedure: Bilateral lateral recess decompression L2-3, L3-4, L4-5;  Surgeon: Kerrin Champagne, MD;  [facility];  Service: Orthopedics;  Laterality: N/A;   LUMBAR LAMINECTOMY/DECOMPRESSION MICRODISCECTOMY N/A 10/02/2016   Procedure: Right L5-S1 Lateral Recess Decompression  microdiscectomy;  Surgeon: Kerrin Champagne, MD;  Location[facility];  Service: Orthopedics;  Laterality: N/A;   LUMBAR LAMINECTOMY/DECOMPRESSION MICRODISCECTOMY N/A 07/15/2018   Procedure: LEFT L3-4 MICRODISCECTOMY;  Surgeon: Kerrin Champagne, MD;  [facility];  Service: Orthopedics;  Laterality: N/A;   svd      x 2   TEAR DUCT PROBING Bilateral 03/01/2019   Procedure: TEAR DUCT PROBING WITH IRRIGATION;  Surgeon: Aura Camps, MD;  Location: [facility];  Service: Ophthalmology;  Laterality: Bilateral;   TOTAL HIP ARTHROPLASTY  Left 03/14/2019   Procedure: LEFT TOTAL HIP ARTHROPLASTY ANTERIOR APPROACH;  Surgeon: [name], MD;  [facility];  Service: Orthopedics;  Laterality: Left;   TOTAL KNEE ARTHROPLASTY Right 09/06/2015   Procedure: RIGHT TOTAL KNEE ARTHROPLASTY;  Surgeon: Kerrin Champagne, MD;  [facility];  Service: Orthopedics;  Laterality: Right;   TOTAL KNEE REVISION Right 05/12/2022   Procedure: RIGHT TOTAL KNEE REVISION ARTHROPLASTY;  Surgeon: [name], MD;  [facility];  Service: Orthopedics;  Laterality: Right;   TUBAL LIGATION      UPPER GASTROINTESTINAL ENDOSCOPY  04/28/2011    Allergies  Allergen Reactions   Other Shortness Of Breath    UNSPECIFIED AGENTS Allergic to perfumes and cleaning products   Shellfish Allergy Anaphylaxis    Per allergy test.   Ace Inhibitors Cough and Other (See Comments)        Celebrex [Celecoxib]      upset stomach  Pt is taking med*   [employer] Other (See Comments)   Aspirin Nausea Only and Other (See Comments)    stomach upset  Other reaction(s): Nausea Only    Review of Systems: Negative except as noted in the HPI.  Objective:  There were no vitals filed for this visit.  Stacy [name] is a pleasant 62 y.o. female in NAD. AAO x 3.  Vascular Examination: Capillary refill time is 3-5 seconds to toes bilateral. Palpable pedal pulses b/l LE. Digital hair present b/l.  Skin temperature gradient WNL b/l. No varicosities b/l. No cyanosis noted b/l.   Dermatological Examination: Pedal skin with normal turgor, texture and tone b/l. No open wounds. No interdigital macerations b/l. Toenails x10 are 3mm thick, discolored, dystrophic with subungual debris. There is pain with compression of the nail plates.  They are elongated x10.  There is a hyperkeratotic lesion on the plantar medial aspect the hallux IPJ bilateral.     Latest Ref Rng & Units 09/07/2022   12:28 PM 04/30/2022    9:09 AM 01/05/2022   10:41 AM  Hemoglobin A1C  Hemoglobin-A1c 0.0 - 7.0 % 6.2  6.6  6.6    Assessment/Plan: 1. Dermatophytosis of nail   2. Callus of foot   3. Pain in toes of both feet    The mycotic toenails were sharply debrided x10 with sterile nail nippers and a power debriding burr to decrease bulk/thickness and length.    The calluses were shaved as a courtesy on the plantar medial aspect of the hallux IPJ bilateral with a sterile #[id number] blade.  Return in about 3 months (around 01/12/2023) for [facility] (she might already have appt with Dr. Reece Agar).   Clerance Lav, DPM,  FACFAS [facility]     [address] [place], [place] [address]                Office [phone]  Fax [phone]

## 2022-10-14 DIAGNOSIS — F331 Major depressive disorder, recurrent, moderate: Secondary | ICD-10-CM | POA: Diagnosis not present

## 2022-10-15 ENCOUNTER — Ambulatory Visit: Payer: Medicaid Other

## 2022-10-16 ENCOUNTER — Ambulatory Visit: Payer: Medicaid Other | Attending: Internal Medicine

## 2022-10-16 DIAGNOSIS — Z23 Encounter for immunization: Secondary | ICD-10-CM | POA: Diagnosis not present

## 2022-10-16 NOTE — Progress Notes (Addendum)
Flu vaccine administered in right deltoid per protocols.  Information sheet given. Patient denies and pain or discomfort at injection site. Tolerated injection well no reaction.

## 2022-10-21 DIAGNOSIS — T84012D Broken internal right knee prosthesis, subsequent encounter: Secondary | ICD-10-CM | POA: Diagnosis not present

## 2022-10-21 DIAGNOSIS — F331 Major depressive disorder, recurrent, moderate: Secondary | ICD-10-CM | POA: Diagnosis not present

## 2022-10-28 DIAGNOSIS — F331 Major depressive disorder, recurrent, moderate: Secondary | ICD-10-CM | POA: Diagnosis not present

## 2022-11-02 ENCOUNTER — Other Ambulatory Visit: Payer: Self-pay | Admitting: Internal Medicine

## 2022-11-02 ENCOUNTER — Other Ambulatory Visit: Payer: Self-pay | Admitting: Physician Assistant

## 2022-11-02 DIAGNOSIS — F419 Anxiety disorder, unspecified: Secondary | ICD-10-CM

## 2022-11-02 NOTE — Telephone Encounter (Unsigned)
Copied from CRM [id number]. Topic: General - Other >> Nov 02, 2022  2:28 PM Everette C wrote: Reason for CRM: Medication Refill - Medication: loratadine (CLARITIN) 10 MG tablet [[id number]]  hydrOXYzine (ATARAX) 10 MG tablet [[id number]]  Has the patient contacted their pharmacy? Yes.   (Agent: If no, request that the patient contact the pharmacy for the refill. If patient does not wish to contact the pharmacy document the reason why and proceed with request.) (Agent: If yes, when and what did the pharmacy advise?)  Preferred Pharmacy (with phone number or street name): [facility] - [place], [place] - [address] [place] [address] Phone: [phone] Fax: [phone] Hours: Not open 24 hours   Has the patient been seen for an appointment in the last year OR does the patient have an upcoming appointment? Yes.    Agent: Please be advised that RX refills may take up to 3 business days. We ask that you follow-up with your pharmacy.

## 2022-11-03 DIAGNOSIS — R159 Full incontinence of feces: Secondary | ICD-10-CM | POA: Diagnosis not present

## 2022-11-03 DIAGNOSIS — R32 Unspecified urinary incontinence: Secondary | ICD-10-CM | POA: Diagnosis not present

## 2022-11-03 DIAGNOSIS — I10 Essential (primary) hypertension: Secondary | ICD-10-CM | POA: Diagnosis not present

## 2022-11-03 NOTE — Telephone Encounter (Signed)
Requested Prescriptions  Pending Prescriptions Disp Refills   hydrOXYzine (ATARAX) 10 MG tablet [Pharmacy Med Name: HYDROXYZINE HCL 10 MG ORAL TABLET] 270 tablet 0    Sig: TAKE 1 TABLET IN THE MORNING AND NOON AND 2 TABLETS IN THE EVENING AS NEEDED.     Ear, Nose, and Throat:  Antihistamines 2 Passed - 11/02/2022 12:42 PM      Passed - Cr in normal range and within 360 days    Creat  Date Value Ref Range Status  03/11/2016 0.90 0.50 - 1.05 mg/dL Final    Comment:      For patients > or = 63 years of age: The upper reference limit for Creatinine is approximately 13% higher for people identified as African-American.      Creatinine, Ser  Date Value Ref Range Status  06/01/2022 0.93 0.44 - 1.00 mg/dL Final   Creatinine, POC  Date Value Ref Range Status  06/10/2016 200 mg/dL Final         Passed - Valid encounter within last 12 months    Recent Outpatient Visits           1 month ago Controlled type 2 diabetes mellitus with diabetic polyneuropathy, without long-term current use of insulin (HCC)   [place] [facility] Jonah Blue B, MD   4 months ago Controlled type 2 diabetes mellitus with diabetic polyneuropathy, without long-term current use of insulin [facility])   [place] [facility] [place], [place], [place]   5 months ago Coarse tremors   [place] [facility] Jonah Blue B, MD   6 months ago Type 2 diabetes mellitus with peripheral neuropathy [facility])   [facility] Blue B, MD   10 months ago Type 2 diabetes mellitus with peripheral neuropathy [facility])    [facility] Marcine Matar, MD       Future Appointments             In 2 months Laural Benes, Binnie Rail, MD [facility]   In 3 months Magnus Ivan, Vanita Panda, MD [facility]

## 2022-11-03 NOTE — Telephone Encounter (Signed)
Request too soon  Requested Prescriptions  Pending Prescriptions Disp Refills   loratadine (CLARITIN) 10 MG tablet 90 tablet 1    Sig: Take 1 tablet (10 mg total) by mouth daily as needed for allergies.     Ear, Nose, and Throat:  Antihistamines 2 Passed - 11/02/2022  2:50 PM      Passed - Cr in normal range and within 360 days    Creat  Date Value Ref Range Status  03/11/2016 0.90 0.50 - 1.05 mg/dL Final    Comment:      For patients > or = 63 years of age: The upper reference limit for Creatinine is approximately 13% higher for people identified as African-American.      Creatinine, Ser  Date Value Ref Range Status  06/01/2022 0.93 0.44 - 1.00 mg/dL Final   Creatinine, POC  Date Value Ref Range Status  06/10/2016 200 mg/dL Final         Passed - Valid encounter within last 12 months    Recent Outpatient Visits           1 month ago Controlled type 2 diabetes mellitus with diabetic polyneuropathy, without long-term current use of insulin (HCC)   [place] [facility] Jonah Blue B, MD   4 months ago Controlled type 2 diabetes mellitus with diabetic polyneuropathy, without long-term current use of insulin [facility])   [place] [facility] [place], [place], [place]   5 months ago Coarse tremors   [place] [facility] Jonah Blue B, MD   6 months ago Type 2 diabetes mellitus with peripheral neuropathy [facility])   [place] [facility] Jonah Blue B, MD   10 months ago Type 2 diabetes mellitus with peripheral neuropathy [facility])   [place] [facility] Marcine Matar, MD       Future Appointments             In 2 months Laural Benes Binnie Rail, MD [facility]   In 3 months [name], MD [facility]             hydrOXYzine (ATARAX) 10 MG tablet 120 tablet 0    Sig: TAKE  1 TABLET IN THE MORNING AND NOON AND 2 TABLETS IN THE EVENING AS NEEDED.     Ear, Nose, and Throat:  Antihistamines 2 Passed - 11/02/2022  2:50 PM      Passed - Cr in normal range and within 360 days    Creat  Date Value Ref Range Status  03/11/2016 0.90 0.50 - 1.05 mg/dL Final    Comment:      For patients > or = 63 years of age: The upper reference limit for Creatinine is approximately 13% higher for people identified as African-American.      Creatinine, Ser  Date Value Ref Range Status  06/01/2022 0.93 0.44 - 1.00 mg/dL Final   Creatinine, POC  Date Value Ref Range Status  06/10/2016 200 mg/dL Final         Passed - Valid encounter within last 12 months    Recent Outpatient Visits           1 month ago Controlled type 2 diabetes mellitus with diabetic polyneuropathy, without long-term current use of insulin [facility])   [place] [facility] Marcine Matar, MD   4 months ago  Controlled type 2 diabetes mellitus with diabetic polyneuropathy, without long-term current use of insulin [facility])   [place] [facility] [place], [place], [place]   5 months ago Coarse tremors   [place] [facility] Jonah Blue B, MD   6 months ago Type 2 diabetes mellitus with peripheral neuropathy [facility])   [facility] Jonah Blue B, MD   10 months ago Type 2 diabetes mellitus with peripheral neuropathy [facility])    [facility] Marcine Matar, MD       Future Appointments             In 2 months Laural Benes Binnie Rail, MD [facility]   In 3 months Magnus Ivan, Vanita Panda, MD [facility]

## 2022-11-04 DIAGNOSIS — F331 Major depressive disorder, recurrent, moderate: Secondary | ICD-10-CM | POA: Diagnosis not present

## 2022-11-05 ENCOUNTER — Telehealth: Payer: Self-pay

## 2022-11-05 ENCOUNTER — Other Ambulatory Visit: Payer: Self-pay | Admitting: Orthopaedic Surgery

## 2022-11-05 ENCOUNTER — Telehealth: Payer: Self-pay | Admitting: Orthopaedic Surgery

## 2022-11-05 MED ORDER — CELECOXIB 200 MG PO CAPS: 200.0000 mg | ORAL_CAPSULE | Freq: Two times a day (BID) | ORAL | 3 refills | Status: DC | PRN

## 2022-11-05 NOTE — Telephone Encounter (Signed)
[facility]) urgent refill of celecoxib. Please send to [facility] ASAP. [place] phone number is [phone].

## 2022-11-05 NOTE — Telephone Encounter (Signed)
Patient asking for a refill of Celebrex

## 2022-11-06 ENCOUNTER — Other Ambulatory Visit: Payer: Medicaid Other

## 2022-11-06 NOTE — Patient Outreach (Signed)
Medicaid Managed Care   Unsuccessful Outreach Note  11/06/2022 Name: [facility]acy [name] MRN: [id number] DOB: Jun 23, 1960  Referred by: Marcine Matar, MD Reason for referral : High Risk Managed Medicaid (MM social work unsuccessful telephone outreach )   An unsuccessful telephone outreach was attempted today. The patient was referred to the case management team for assistance with care management and care coordination.   Follow Up Plan: A HIPAA compliant phone message was left for the patient providing contact information and requesting a return call.   Abelino Derrick, MHA [facility]  Managed [facility] Social Worker [phone]

## 2022-11-06 NOTE — Patient Instructions (Signed)
Medicaid Managed Care   Unsuccessful Outreach Note  11/06/2022 Name: Stacy [name] [facility] MRN: [id number] DOB: Jun 23, 1960  Referred by: Marcine Matar, MD Reason for referral : High Risk Managed Medicaid (MM social work unsuccessful telephone outreach )   An unsuccessful telephone outreach was attempted today. The patient was referred to the case management team for assistance with care management and care coordination.   Follow Up Plan: A HIPAA compliant phone message was left for the patient providing contact information and requesting a return call.   Abelino Derrick, MHA [facility]  Managed [facility] Social Worker [phone]

## 2022-11-11 DIAGNOSIS — F331 Major depressive disorder, recurrent, moderate: Secondary | ICD-10-CM | POA: Diagnosis not present

## 2022-11-16 DIAGNOSIS — J449 Chronic obstructive pulmonary disease, unspecified: Secondary | ICD-10-CM | POA: Diagnosis not present

## 2022-11-16 DIAGNOSIS — G4733 Obstructive sleep apnea (adult) (pediatric): Secondary | ICD-10-CM | POA: Diagnosis not present

## 2022-11-19 DIAGNOSIS — F331 Major depressive disorder, recurrent, moderate: Secondary | ICD-10-CM | POA: Diagnosis not present

## 2022-11-20 DIAGNOSIS — T84012D Broken internal right knee prosthesis, subsequent encounter: Secondary | ICD-10-CM | POA: Diagnosis not present

## 2022-11-25 DIAGNOSIS — F331 Major depressive disorder, recurrent, moderate: Secondary | ICD-10-CM | POA: Diagnosis not present

## 2022-12-02 DIAGNOSIS — F331 Major depressive disorder, recurrent, moderate: Secondary | ICD-10-CM | POA: Diagnosis not present

## 2022-12-04 DIAGNOSIS — R32 Unspecified urinary incontinence: Secondary | ICD-10-CM | POA: Diagnosis not present

## 2022-12-04 DIAGNOSIS — I10 Essential (primary) hypertension: Secondary | ICD-10-CM | POA: Diagnosis not present

## 2022-12-04 DIAGNOSIS — R159 Full incontinence of feces: Secondary | ICD-10-CM | POA: Diagnosis not present

## 2022-12-07 ENCOUNTER — Other Ambulatory Visit: Payer: Self-pay | Admitting: Physician Assistant

## 2022-12-08 ENCOUNTER — Other Ambulatory Visit: Payer: Self-pay | Admitting: Physician Assistant

## 2022-12-08 NOTE — Telephone Encounter (Signed)
Requested Prescriptions  Pending Prescriptions Disp Refills   SPIRIVA HANDIHALER 18 MCG inhalation capsule [Pharmacy Med Name: SPIRIVA HANDIHALER 18 MCG INHALATION CAPSULE] 90 capsule 0    Sig: PLACE 1 CAPSULE (18 MCG TOTAL) INTO INHALER AND INHALE DAILY.     Pulmonology:  Anticholinergic Agents Passed - 12/07/2022  1:43 PM      Passed - Valid encounter within last 12 months    Recent Outpatient Visits           3 months ago Controlled type 2 diabetes mellitus with diabetic polyneuropathy, without long-term current use of insulin (HCC)   [place] [facility] [place]. [facility] Jonah Blue B, MD   5 months ago Controlled type 2 diabetes mellitus with diabetic polyneuropathy, without long-term current use of insulin (HCC)   [place] [facility] [place]. [facility] [place], [place], [place]   6 months ago Coarse tremors   [place] [facility] [place]. [facility] Jonah Blue B, MD   7 months ago Type 2 diabetes mellitus with peripheral neuropathy [facility])   [place] [facility] [place]. [facility] Jonah Blue B, MD   11 months ago Type 2 diabetes mellitus with peripheral neuropathy [facility])   [place] [facility] - [facility] Of [place]. [facility] Marcine Matar, MD       Future Appointments             In 1 month Laural Benes Binnie Rail, MD [facility] [place] - [facility] Of [place]igha [place]ide[place]. [facility]   In 2 months Magnus Ivan, Vanita Panda, MD [facility]

## 2022-12-09 DIAGNOSIS — F331 Major depressive disorder, recurrent, moderate: Secondary | ICD-10-CM | POA: Diagnosis not present

## 2022-12-16 DIAGNOSIS — F331 Major depressive disorder, recurrent, moderate: Secondary | ICD-10-CM | POA: Diagnosis not present

## 2022-12-21 DIAGNOSIS — T84012D Broken internal right knee prosthesis, subsequent encounter: Secondary | ICD-10-CM | POA: Diagnosis not present

## 2022-12-23 DIAGNOSIS — F331 Major depressive disorder, recurrent, moderate: Secondary | ICD-10-CM | POA: Diagnosis not present

## 2022-12-24 ENCOUNTER — Ambulatory Visit: Payer: Medicaid Other | Admitting: Physician Assistant

## 2022-12-24 ENCOUNTER — Other Ambulatory Visit (INDEPENDENT_AMBULATORY_CARE_PROVIDER_SITE_OTHER): Payer: Medicaid Other

## 2022-12-24 DIAGNOSIS — M79641 Pain in right hand: Secondary | ICD-10-CM | POA: Diagnosis not present

## 2022-12-24 DIAGNOSIS — M79642 Pain in left hand: Secondary | ICD-10-CM | POA: Diagnosis not present

## 2022-12-24 MED ORDER — METHYLPREDNISOLONE ACETATE 40 MG/ML IJ SUSP
20.0000 mg | INTRAMUSCULAR | Status: AC | PRN
Start: 2022-12-24 — End: 2022-12-24
  Administered 2022-12-24: 20 mg

## 2022-12-24 MED ORDER — LIDOCAINE HCL 1 % IJ SOLN
0.5000 mL | INTRAMUSCULAR | Status: AC | PRN
Start: 2022-12-24 — End: 2022-12-24
  Administered 2022-12-24: .5 mL

## 2022-12-24 MED ORDER — DICLOFENAC SODIUM 1 % EX GEL: 2.0000 g | Freq: Four times a day (QID) | CUTANEOUS | 5 refills | Status: AC

## 2022-12-24 MED ORDER — DICLOFENAC SODIUM 1 % EX GEL: 2.0000 g | Freq: Four times a day (QID) | CUTANEOUS | 5 refills | Status: DC

## 2022-12-24 NOTE — Progress Notes (Signed)
Office Visit Note   Patient: Stacy [name]           Date of Birth: 1960/10/01           MRN: [id number] Visit Date: 12/24/2022              Requested by: Marcine Matar, MD [address] [place],  [place] [address] PCP: Marcine Matar, MD   Assessment & Plan: Visit Diagnoses:  1. Pain in right hand   2. Pain in left hand     Plan:  Recommend she use Voltaren gel up to 2 g 4 times daily to the hands.  Maximal tenderness.  She will follow-up with [place] as needed.  Questions encouraged and answered  Follow-Up Instructions: Return if symptoms worsen or fail to improve.   Orders:  Orders Placed This Encounter  Procedures   Hand/UE Inj: R thumb CMC   XR Hand Complete Right   XR Hand Complete Left   No orders of the defined types were placed in this encounter.     Procedures: Hand/UE Inj: R thumb CMC for osteoarthritis on 12/24/2022 1:38 PM Details: dorsal approach Medications: 0.5 mL lidocaine 1 %; 20 mg methylPREDNISolone acetate 40 MG/ML      Clinical Data: No additional findings.   Subjective: Chief Complaint  Patient presents with   Right Hand - Pain   Left Hand - Pain    HPI [name] comes in today due to bilateral hand pain.  Patient states both hands bother her particularly at the bases of the thumbs.  States that it is stiff.  She has had no known injury.  She also has a nodule on the left thumb.  She is diabetic last hemoglobin A1c was 6.6. Review of Systems Negative for fevers chills.  Objective: Vital Signs: LMP 09/01/2010   Physical Exam General: Well-developed well-nourished female in no acute distress. Ortho Exam Bilateral hands: Left hand she has a Herbon's nodule at the IP joint of the thumb.  Radial pulses are 2+ and equal symmetric.  Sensation grossly intact bilateral hands.  Tenderness over the [facility] joints bilaterally.  Grind test is positive bilaterally. Specialty Comments:  No specialty comments  available.  Imaging: XR Hand Complete Left  Result Date: 12/24/2022 Left hand 3 views: Mild CMC joint arthritis.  Thumb IP joint osteophytes consistent with mild to moderate arthritis.  No evidence of acute findings or fractures.  XR Hand Complete Right  Result Date: 12/24/2022 Right hand 3 views: No acute fractures acute findings.  Moderate to moderately severe CMC joint arthritic changes.  No other acute findings.    PMFS History: Patient Active Problem List   Diagnosis Date Noted   Status post revision of total replacement of right knee 05/12/2022   History of colonic polyps 03/20/2022   Failed total knee, right, subsequent encounter 03/02/2022   Loose right total knee arthroplasty (HCC) 03/02/2022   Mixed stress and urge urinary incontinence 12/04/2021   Functional fecal incontinence 12/04/2021   IBS (irritable bowel syndrome) 11/19/2021   Spondylolisthesis, lumbar region    Other spondylosis with radiculopathy, lumbar region    Other secondary scoliosis, lumbar region    Fusion of spine of lumbar region 03/25/2021   Colon polyps 06/06/2020   Lupus 06/06/2020   Allergic rhinitis due to animal (cat) (dog) hair and dander 04/08/2020   Allergic rhinitis due to pollen 04/08/2020   Food allergy 04/08/2020   Acute medial meniscus tear, left, subsequent  encounter 03/21/2020   Chronic pain of left knee 02/15/2020   Paresthesia of skin 11/16/2019   History of total knee replacement, right 11/16/2019   Tobacco abuse 11/16/2019   Centrilobular emphysema (HCC) 08/10/2019   Incidental lung nodule, > 3mm and < 8mm 08/10/2019   OSA on CPAP 08/10/2019   Dyspnea on exertion 08/02/2019   Hyperlipidemia 08/02/2019   Lumbar radiculopathy 04/24/2019   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   Post laminectomy syndrome 02/23/2019   Abnormality of gait 02/23/2019   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   Unilateral primary osteoarthritis, left hip 12/28/2018   Lesion of skin of left ear  12/26/2018   Primary osteoarthritis of left hip 12/02/2018   Iron deficiency anemia 10/16/2018   Chronic pain syndrome 09/08/2018   Chronic pain of right knee 08/11/2018   Status post lumbar laminectomy 07/15/2018   Peripheral arterial disease (HCC) 04/05/2018   Moderate persistent asthma without complication 06/29/2017   Environmental and seasonal allergies 06/29/2017   Controlled type 2 diabetes mellitus with diabetic polyneuropathy, without long-term current use of insulin (HCC) 06/29/2017   Perennial allergic rhinitis 04/08/2017   Sensorineural hearing loss (SNHL), bilateral 04/08/2017   Chronic pansinusitis 03/25/2017   Eustachian tube dysfunction, bilateral 03/25/2017   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   Herniation of lumbar intervertebral disc with radiculopathy 10/02/2016    Class: Chronic   Alopecia areata 08/19/2016   Chondromalacia of both patellae 06/03/2015    Class: Chronic   Spinal stenosis, lumbar region, with neurogenic claudication 06/03/2015   Tobacco use disorder 04/25/2015   DJD (degenerative joint disease) of knee 01/04/2015   Hemorrhoid 11/14/2014   Gout of big toe 07/19/2014   Essential hypertension 08/14/2013   Gastroesophageal reflux disease without esophagitis 08/14/2013   COPD (chronic obstructive pulmonary disease) (HCC) 04/17/2011   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   Asthma    COPD (chronic obstructive pulmonary disease) (HCC)    Depression    Diabetes mellitus    Type II   GERD (gastroesophageal reflux disease)    Pt on Protonix daily   Glaucoma    Gout    Headache(784.0)    otc meds prn   Hyperlipidemia    Hypertension    Irritable bowel syndrome 11/19/2010   Neuropathy    Pneumonia YRS AGO   Restless legs    Shortness of breath    07/14/2018- uses  4 times a day   Sleep apnea     Family History   Problem Relation Age of Onset   Hypertension Father    Cancer Father    Heart disease Mother    Asthma Son        had as a child   Heart disease Sister    Breast cancer Sister    Hypertension Brother     Past Surgical History:  Procedure Laterality Date   BACK SURGERY     feb 21, 23   CHOLECYSTECTOMY     COLONOSCOPY     ENDOMETRIAL ABLATION  10/2010   HERNIA REPAIR     umbicial hernia   JOINT REPLACEMENT Left 03/14/2019   Dr. Magnus Ivan hip   KNEE ARTHROSCOPY Left    06/07/2017 Dr. August [facility]   KNEE ARTHROSCOPY Left 03/21/2020   Procedure: LEFT KNEE ARTHROSCOPY WITH PARTIAL MEDIAL MENISCECTOMY;  Surgeon: [name], MD;  Location: [facility];  Service: Orthopedics;  Laterality: Left;   KNEE CLOSED REDUCTION Right 12/06/2015   Procedure: CLOSED MANIPULATION RIGHT KNEE;  Surgeon: Kerrin Champagne, MD;  [facility];  Service: Orthopedics;  Laterality: Right;   KNEE CLOSED REDUCTION Right 01/17/2016   Procedure: CLOSED MANIPULATION RIGHT KNEE;  Surgeon: Kerrin Champagne, MD;  Location: [facility];  Service: Orthopedics;  Laterality: Right;   KNEE JOINT MANIPULATION Right 12/06/2015   LACRIMAL TUBE INSERTION Bilateral 03/01/2019   Procedure: LACRIMAL TUBE INSERTION;  Surgeon: Aura Camps, MD;  Location: [facility];  Service: Ophthalmology;  Laterality: Bilateral;   LACRIMAL TUBE REMOVAL Bilateral 05/03/2019   Procedure: BILATERAL NASOLACRIMAL DUCT PROBING, IIRIGATION AND TUBE REMOVAL BOTH EYES;  Surgeon: Aura Camps, MD;  Location: [facility];  Service: Ophthalmology;  Laterality: Bilateral;   LUMBAR LAMINECTOMY/DECOMPRESSION MICRODISCECTOMY N/A 06/03/2015   Procedure: Bilateral lateral recess decompression L2-3, L3-4, L4-5;  Surgeon: Kerrin Champagne, MD;  [facility];  Service: Orthopedics;  Laterality: N/A;   LUMBAR LAMINECTOMY/DECOMPRESSION MICRODISCECTOMY N/A 10/02/2016   Procedure: Right L5-S1 Lateral  Recess Decompression  microdiscectomy;  Surgeon: Kerrin Champagne, MD;  Location[facility] OR;  Service: Orthopedics;  Laterality: N/A;   LUMBAR LAMINECTOMY/DECOMPRESSION MICRODISCECTOMY N/A 07/15/2018   Procedure: LEFT L3-4 MICRODISCECTOMY;  Surgeon: Kerrin Champagne, MD;  Location: [facility] OR;  Service: Orthopedics;  Laterality: N/A;   svd      x 2   TEAR DUCT PROBING Bilateral 03/01/2019   Procedure: TEAR DUCT PROBING WITH IRRIGATION;  Surgeon: Aura Camps, MD;  Location: [facility];  Service: Ophthalmology;  Laterality: Bilateral;   TOTAL HIP ARTHROPLASTY Left 03/14/2019   Procedure: LEFT TOTAL HIP ARTHROPLASTY ANTERIOR APPROACH;  Surgeon: [name], MD;  [facility];  Service: Orthopedics;  Laterality: Left;   TOTAL KNEE ARTHROPLASTY Right 09/06/2015   Procedure: RIGHT TOTAL KNEE ARTHROPLASTY;  Surgeon: Kerrin Champagne, MD;  [facility];  Service: Orthopedics;  Laterality: Right;   TOTAL KNEE REVISION Right 05/12/2022   Procedure: RIGHT TOTAL KNEE REVISION ARTHROPLASTY;  Surgeon: [name], MD;  [facility];  Service: Orthopedics;  Laterality: Right;   TUBAL LIGATION     UPPER GASTROINTESTINAL ENDOSCOPY  04/28/2011   Social History   Occupational History   Occupation: unemployed    Associate Professor: UNEMPLOYED  Tobacco Use   Smoking status: Former   Smokeless tobacco: Never  Advertising account planner   Vaping status: Never Used  Substance and Sexual Activity   Alcohol use: No   Drug use: No   Sexual activity: Yes    Birth control/protection: Surgical, Post-menopausal    Comment: tubal ligation

## 2022-12-30 DIAGNOSIS — F331 Major depressive disorder, recurrent, moderate: Secondary | ICD-10-CM | POA: Diagnosis not present

## 2023-01-03 DIAGNOSIS — I10 Essential (primary) hypertension: Secondary | ICD-10-CM | POA: Diagnosis not present

## 2023-01-03 DIAGNOSIS — R32 Unspecified urinary incontinence: Secondary | ICD-10-CM | POA: Diagnosis not present

## 2023-01-04 ENCOUNTER — Other Ambulatory Visit: Payer: Self-pay | Admitting: Internal Medicine

## 2023-01-04 DIAGNOSIS — F419 Anxiety disorder, unspecified: Secondary | ICD-10-CM

## 2023-01-04 NOTE — Telephone Encounter (Signed)
Medication Refill -  Most Recent Primary Care Visit:  Provider: [facility] NURSE  Department: [facility]  Visit Type: FLU SHOT  Date: 10/16/2022  Medication: hydrOXYzine (ATARAX) 10 MG tablet [[id number]]  and SPIRIVA HANDIHALER 18 MCG inhalation capsule [[id number]  Has the patient contacted their pharmacy? Yes  Is this the correct pharmacy for this prescription? Yes If no, delete pharmacy and type the correct one.  This is the patient's preferred pharmacy:  [facility] - [place], [place] - [address] [place] [address] Phone: [phone] Fax: [phone]   Has the prescription been filled recently? Yes  Is the patient out of the medication? Yes  Has the patient been seen for an appointment in the last year OR does the patient have an upcoming appointment? Yes  Can we respond through MyChart? Yes  Agent: Please be advised that Rx refills may take up to 3 business days. We ask that you follow-up with your pharmacy.

## 2023-01-06 ENCOUNTER — Other Ambulatory Visit: Payer: Self-pay | Admitting: Internal Medicine

## 2023-01-06 ENCOUNTER — Other Ambulatory Visit: Payer: Self-pay | Admitting: Physician Assistant

## 2023-01-06 DIAGNOSIS — I739 Peripheral vascular disease, unspecified: Secondary | ICD-10-CM

## 2023-01-06 DIAGNOSIS — F331 Major depressive disorder, recurrent, moderate: Secondary | ICD-10-CM | POA: Diagnosis not present

## 2023-01-06 DIAGNOSIS — F419 Anxiety disorder, unspecified: Secondary | ICD-10-CM

## 2023-01-07 ENCOUNTER — Other Ambulatory Visit: Payer: Self-pay | Admitting: Physician Assistant

## 2023-01-07 MED ORDER — SPIRIVA HANDIHALER 18 MCG IN CAPS: 1.0000 | ORAL_CAPSULE | Freq: Every day | RESPIRATORY_TRACT | 0 refills | Status: DC

## 2023-01-07 NOTE — Telephone Encounter (Signed)
Requested Prescriptions  Pending Prescriptions Disp Refills   hydrOXYzine (ATARAX) 10 MG tablet 270 tablet     Sig: TAKE 1 TABLET IN THE MORNING AND NOON AND 2 TABLETS IN THE EVENING AS NEEDED.     Ear, Nose, and Throat:  Antihistamines 2 Passed - 01/04/2023 10:26 AM      Passed - Cr in normal range and within 360 days    Creat  Date Value Ref Range Status  03/11/2016 0.90 0.50 - 1.05 mg/dL Final    Comment:      For patients > or = 63 years of age: The upper reference limit for Creatinine is approximately 13% higher for people identified as African-American.      Creatinine, Ser  Date Value Ref Range Status  06/01/2022 0.93 0.44 - 1.00 mg/dL Final   Creatinine, POC  Date Value Ref Range Status  06/10/2016 200 mg/dL Final         Passed - Valid encounter within last 12 months    Recent Outpatient Visits           4 months ago Controlled type 2 diabetes mellitus with diabetic polyneuropathy, without long-term current use of insulin (HCC)   [facility] [place]. [facility] Jonah Blue B, MD   6 months ago Controlled type 2 diabetes mellitus with diabetic polyneuropathy, without long-term current use of insulin (HCC)   [facility] Of [place]. [facility] [place], Marylene Land M, [place]   7 months ago Coarse tremors   [facility] [facility] [place]. [facility] Jonah Blue B, MD   8 months ago Type 2 diabetes mellitus with peripheral neuropathy [facility])   [facility] - [facility] Dept Of [place]. [facility] Marcine Matar, MD   1 year ago Type 2 diabetes mellitus with peripheral neuropathy [facility])   [facility] - A Dept Of [place]. [facility] Marcine Matar, MD       Future Appointments             Tomorrow Marcine Matar, MD [facility] [place] [facility] Of [place]igha Bridegroom. [facility]   In 2 weeks Kirtland Bouchard, PA-C [place]   In 1 month [name], MD [facility]             SPIRIVA HANDIHALER 18 MCG inhalation capsule 90 capsule 0    Sig: Place 1 capsule (18 mcg total) into inhaler and inhale daily.     Pulmonology:  Anticholinergic Agents Passed - 01/04/2023 10:26 AM      Passed - Valid encounter within last 12 months    Recent Outpatient Visits           4 months ago Controlled type 2 diabetes mellitus with diabetic polyneuropathy, without long-term current use of insulin (HCC)   [facility] Dept Of [place]. [facility] Jonah Blue B, MD   6 months ago Controlled type 2 diabetes mellitus with diabetic polyneuropathy, without long-term current use of insulin (HCC)   [place] [facility] - A Dept Of [place]. [facility] [place], Marylene Land M, [place]   7 months ago Coarse tremors   [facility] [place] - [facility] Dept Of [place].  [facility] Jonah Blue B, MD   8 months ago Type 2 diabetes mellitus with peripheral neuropathy [facility])   [facility] - A Dept Of [place]. [facility] Marcine Matar, MD   1 year ago Type 2 diabetes mellitus with peripheral neuropathy [facility])   [facility] [facility] - A Dept Of [place]. [facility] Marcine Matar, MD       Future Appointments             Tomorrow Marcine Matar, MD [facility] [place] - A Dept Of [place]. [facility]   In 2 weeks Chestine Spore, Allayne Gitelman, PA-C [place]   In 1 month Magnus Ivan, Vanita Panda, MD [facility]

## 2023-01-08 ENCOUNTER — Encounter: Payer: Self-pay | Admitting: Internal Medicine

## 2023-01-08 ENCOUNTER — Ambulatory Visit: Payer: Medicaid Other | Attending: Internal Medicine | Admitting: Internal Medicine

## 2023-01-08 VITALS — BP 116/73 | HR 84 | Temp 98.3°F | Wt 161.0 lb

## 2023-01-08 DIAGNOSIS — E1142 Type 2 diabetes mellitus with diabetic polyneuropathy: Secondary | ICD-10-CM

## 2023-01-08 DIAGNOSIS — J449 Chronic obstructive pulmonary disease, unspecified: Secondary | ICD-10-CM

## 2023-01-08 DIAGNOSIS — Z7984 Long term (current) use of oral hypoglycemic drugs: Secondary | ICD-10-CM | POA: Diagnosis not present

## 2023-01-08 DIAGNOSIS — L930 Discoid lupus erythematosus: Secondary | ICD-10-CM | POA: Diagnosis not present

## 2023-01-08 DIAGNOSIS — I739 Peripheral vascular disease, unspecified: Secondary | ICD-10-CM | POA: Diagnosis not present

## 2023-01-08 DIAGNOSIS — I152 Hypertension secondary to endocrine disorders: Secondary | ICD-10-CM | POA: Diagnosis not present

## 2023-01-08 DIAGNOSIS — E1159 Type 2 diabetes mellitus with other circulatory complications: Secondary | ICD-10-CM | POA: Diagnosis not present

## 2023-01-08 DIAGNOSIS — E119 Type 2 diabetes mellitus without complications: Secondary | ICD-10-CM

## 2023-01-08 LAB — POCT GLYCOSYLATED HEMOGLOBIN (HGB A1C): HbA1c, POC (controlled diabetic range): 6.8 % (ref 0.0–7.0)

## 2023-01-08 LAB — GLUCOSE, POCT (MANUAL RESULT ENTRY): POC Glucose: 102 mg/dL — AB (ref 70–99)

## 2023-01-08 MED ORDER — ATORVASTATIN CALCIUM 40 MG PO TABS: 40.0000 mg | ORAL_TABLET | Freq: Every day | ORAL | 1 refills | Status: DC

## 2023-01-08 MED ORDER — ALBUTEROL SULFATE (2.5 MG/3ML) 0.083% IN NEBU: INHALATION_SOLUTION | RESPIRATORY_TRACT | 0 refills | Status: DC

## 2023-01-08 MED ORDER — METFORMIN HCL ER 750 MG PO TB24: 750.0000 mg | ORAL_TABLET | Freq: Every day | ORAL | 1 refills | Status: DC

## 2023-01-08 MED ORDER — CELECOXIB 200 MG PO CAPS: 200.0000 mg | ORAL_CAPSULE | Freq: Every day | ORAL | 3 refills | Status: DC

## 2023-01-08 MED ORDER — CLOPIDOGREL BISULFATE 75 MG PO TABS: 75.0000 mg | ORAL_TABLET | Freq: Every day | ORAL | 1 refills | Status: DC

## 2023-01-08 NOTE — Patient Instructions (Signed)
Decrease Celebrex to 200 mg one tablet daily.

## 2023-01-08 NOTE — Progress Notes (Signed)
Patient ID: Stacy [name] [facility], female    DOB: Aug 28, 1960  MRN: [id number]  CC: Diabetes (DM f/u. )   Subjective: Stacy [name] is a 62 y.o. female who presents for chronic ds management. Her concerns today include:  hx of HTN, DM with neuropathy, aortic atherosclerosis, tob dep, HL, PAD, IDA, lupus, spinal stenosis with neurogenic claudication (s/p laminectomy 07/2018), COPD, OSA on CPAP,LS, OA knees, gout, recurrent sinusitis, receiving allergy shots from Dr. Young Berry.    DM: Results for orders placed or performed in visit on 01/08/23  POCT glycosylated hemoglobin (Hb A1C)  Result Value Ref Range   Hemoglobin A1C     HbA1c POC (<> result, manual entry)     HbA1c, POC (prediabetic range)     HbA1c, POC (controlled diabetic range) 6.8 0.0 - 7.0 %  POCT glucose (manual entry)  Result Value Ref Range   POC Glucose 102 (A) 70 - 99 mg/dl   *Note: Due to a large number of results and/or encounters for the requested time period, some results have not been displayed. A complete set of results can be found in Results Review.  Compliant with Metformin XR 750 mg and Farxiga 5mg  daily.   Doing well with her eating habits.  Checks blood sugars about 3 times a day.  Last reading was 114.  HTN:  taking Diovan/HCTZ 160/12.5 mg once a day.  No chest pains. She is doing well on Spiriva in regards to her COPD without any recent flares.  She tells me that she quit smoking since June of last year.  HL/PAD: on Lipitor 40 mg and Plavix.  Reports compliance with taking the medications.  She tells me that she has a follow-up appointment with [facility] gastroenterology the first week of next year for follow-up on IBS.  Her previous gastroenterologist there has retired.  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  Currently on triamcinolone cream for itchy rash over the upper back and anterior chest. Patient Active Problem List   Diagnosis Date Noted   Status  post revision of total replacement of right knee 05/12/2022   History of colonic polyps 03/20/2022   Failed total knee, right, subsequent encounter 03/02/2022   Loose right total knee arthroplasty (HCC) 03/02/2022   Mixed stress and urge urinary incontinence 12/04/2021   Functional fecal incontinence 12/04/2021   IBS (irritable bowel syndrome) 11/19/2021   Spondylolisthesis, lumbar region    Other spondylosis with radiculopathy, lumbar region    Other secondary scoliosis, lumbar region    Fusion of spine of lumbar region 03/25/2021   Colon polyps 06/06/2020   Lupus 06/06/2020   Allergic rhinitis due to animal (cat) (dog) hair and dander 04/08/2020   Allergic rhinitis due to pollen 04/08/2020   Food allergy 04/08/2020   Acute medial meniscus tear, left, subsequent encounter 03/21/2020   Chronic pain of left knee 02/15/2020   Paresthesia of skin 11/16/2019   History of total knee replacement, right 11/16/2019   Tobacco abuse 11/16/2019   Centrilobular emphysema (HCC) 08/10/2019   Incidental lung nodule, > 3mm and < 8mm 08/10/2019   OSA on CPAP 08/10/2019   Dyspnea on exertion 08/02/2019   Hyperlipidemia 08/02/2019   Lumbar radiculopathy 04/24/2019   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   Post laminectomy syndrome 02/23/2019   Abnormality of gait 02/23/2019   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   Unilateral primary osteoarthritis, left hip 12/28/2018   Lesion of skin of left ear  12/26/2018   Primary osteoarthritis of left hip 12/02/2018   Iron deficiency anemia 10/16/2018   Chronic pain syndrome 09/08/2018   Chronic pain of right knee 08/11/2018   Status post lumbar laminectomy 07/15/2018   Peripheral arterial disease (HCC) 04/05/2018   Moderate persistent asthma without complication 06/29/2017   Environmental and seasonal allergies 06/29/2017   Controlled type 2 diabetes mellitus with diabetic polyneuropathy, without long-term current use of insulin (HCC) 06/29/2017    Perennial allergic rhinitis 04/08/2017   Sensorineural hearing loss (SNHL), bilateral 04/08/2017   Chronic pansinusitis 03/25/2017   Eustachian tube dysfunction, bilateral 03/25/2017   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   Herniation of lumbar intervertebral disc with radiculopathy 10/02/2016    Class: Chronic   Alopecia areata 08/19/2016   Chondromalacia of both patellae 06/03/2015    Class: Chronic   Spinal stenosis, lumbar region, with neurogenic claudication 06/03/2015   Tobacco use disorder 04/25/2015   DJD (degenerative joint disease) of knee 01/04/2015   Hemorrhoid 11/14/2014   Gout of big toe 07/19/2014   Essential hypertension 08/14/2013   Gastroesophageal reflux disease without esophagitis 08/14/2013   COPD (chronic obstructive pulmonary disease) (HCC) 04/17/2011     Current Outpatient Medications on File Prior to Visit  Medication Sig Dispense Refill   Accu-Chek Softclix Lancets lancets USE AS INSTRUCTED 3 TIMES A DAY 100 each 0   allopurinol (ZYLOPRIM) 100 MG tablet TAKE 1 TABLET (100 MG TOTAL) BY MOUTH DAILY.(AM) 90 tablet 3   Blood Glucose Monitoring Suppl (ACCU-CHEK AVIVA PLUS) w/Device KIT 1 each by Does not apply route 3 (three) times daily. 1 kit 0   dapagliflozin propanediol (FARXIGA) 5 MG TABS tablet Take 1 tablet (5 mg total) by mouth daily before breakfast. 90 tablet 1   diclofenac Sodium (VOLTAREN) 1 % GEL Apply 2 g topically 4 (four) times daily. 350 g 5   DULoxetine (CYMBALTA) 60 MG capsule TAKE 1 CAPSULE (60 MG TOTAL) BY MOUTH DAILY (AM) 30 capsule 6   EPINEPHrine 0.3 mg/0.3 mL IJ SOAJ injection 0.3 mg IM x 1 PRN for allergic reaction 1 each 1   ferrous sulfate ([name]) 325 (65 FE) MG tablet TAKE ONE TABLET ( 325 MG ) BY MOUTH DAILY (AM) 100 tablet 1   fluticasone (FLONASE) 50 MCG/ACT nasal spray Place 1 spray into both nostrils daily. 16 g 6   Glucagon 1 MG/0.2ML SOSY 1 mg arrange subcutaneously as needed for severe symptomatic low blood sugar 0.2 mL 0   glucose  blood (ACCU-CHEK AVIVA PLUS) test strip USE AS INSTRUCTED 3 TIMES A DAY 100 each 0   glycopyrrolate (ROBINUL) 2 MG tablet Take 2 mg by mouth 3 (three) times daily.     hydrOXYzine (ATARAX) 10 MG tablet TAKE 1 TABLET IN THE MORNING AND NOON AND 2 TABLETS IN THE EVENING AS NEEDED. 120 tablet 0   Lancets (ACCU-CHEK SOFT TOUCH) lancets Use as instructed 100 each 12   loratadine (CLARITIN) 10 MG tablet Take 1 tablet (10 mg total) by mouth daily as needed for allergies. 90 tablet 1   Multiple Vitamins-Minerals (CENTRUM SILVER 50+WOMEN PO) Take 1 tablet by mouth daily.     pantoprazole (PROTONIX) 40 MG tablet Take 40 mg by mouth daily.     potassium chloride SA (KLOR-CON M) 20 MEQ tablet Take 1 tablet (20 mEq total) by mouth daily. 90 tablet 1   SPIRIVA HANDIHALER 18 MCG inhalation capsule Place 1 capsule (18 mcg total) into inhaler and inhale daily. 90 capsule 0   SYMBICORT 160-4.5  MCG/ACT inhaler Inhale 1 puff into the lungs 2 (two) times daily.     triamcinolone cream (KENALOG) 0.1 % Apply 1 Application topically 2 (two) times daily as needed (irritation). 45 g 1   valsartan-hydrochlorothiazide (DIOVAN-HCT) 160-12.5 MG tablet TAKE ONE TABLET BY MOUTH ONCE DAILY (AM) 90 tablet 1   melatonin 5 MG TABS Take 5 mg by mouth at bedtime. (Patient not taking: Reported on 01/08/2023)     methocarbamol (ROBAXIN) 500 MG tablet TAKE 1 TABLET (500 MG TOTAL) BY MOUTH EVERY 6 (SIX) HOURS AS NEEDED FOR MUSCLE SPASMS. (Patient not taking: Reported on 01/08/2023) 40 tablet 1   Methylnaltrexone Bromide 8 MG/0.4ML SOLN Take 0.4 mLs by mouth as needed. Take once daily as needed (Patient not taking: Reported on 01/08/2023) 2.1 mL 0   montelukast (SINGULAIR) 10 MG tablet Take 10 mg by mouth daily. (Patient not taking: Reported on 01/08/2023)     Respiratory Therapy Supplies (FLUTTER) DEVI Use after breathing treatment 4 times daily (Patient not taking: Reported on 01/08/2023) 1 each 0   [DISCONTINUED] Fluticasone-Salmeterol  (ADVAIR) 500-50 MCG/DOSE AEPB Inhale 1 puff into the lungs every 12 (twelve) hours.       [DISCONTINUED] lisinopril (PRINIVIL,ZESTRIL) 40 MG tablet Take 40 mg by mouth daily.       Current Facility-Administered Medications on File Prior to Visit  Medication Dose Route Frequency Provider Last Rate Last Admin   glucose chewable tablet 12 g  3 tablet Oral Once Marcine Matar, MD        Allergies  Allergen Reactions   Other Shortness Of Breath    UNSPECIFIED AGENTS Allergic to perfumes and cleaning products   Shellfish Allergy Anaphylaxis    Per allergy test.   Ace Inhibitors Cough and Other (See Comments)        Celebrex [Celecoxib]      upset stomach  Pt is taking med*   [employer] Other (See Comments)   Aspirin Nausea Only and Other (See Comments)    stomach upset  Other reaction(s): Nausea Only    Social History   Socioeconomic History   Marital status: Married    Spouse name: Not on file   Number of children: 2   Years of education: Not on file   Highest education level: Not on file  Occupational History   Occupation: unemployed    Employer: UNEMPLOYED  Tobacco Use   Smoking status: Former   Smokeless tobacco: Never  Advertising account planner   Vaping status: Never Used  Substance and Sexual Activity   Alcohol use: No   Drug use: No   Sexual activity: Yes    Birth control/protection: Surgical, Post-menopausal    Comment: tubal ligation  Other Topics Concern   Not on file  Social History Narrative   Left Handed    Lives in a two story apartment. With husband   Drinks Caffeine 1 cup   retired   International aid/development worker of [employer] Strain: Medium Risk (01/08/2023)   Overall Financial Resource Strain (CARDIA)    Difficulty of Paying Living Expenses: Somewhat hard  Food Insecurity: Food Insecurity Present (01/08/2023)   Hunger Vital Sign    Worried About Running Out of Food in the Last Year: Sometimes true    Ran Out of Food in the Last  Year: Never true  Transportation Needs: No Transportation Needs (01/08/2023)   PRAPARE - Administrator, Civil Service (Medical): No    Lack of Transportation (Non-Medical): No  Physical Activity: Sufficiently Active (01/08/2023)   Exercise Vital Sign    Days of Exercise per Week: 7 days    Minutes of Exercise per Session: 30 min  Stress: Stress Concern Present (01/08/2023)   [employer] of Occupational Health - Occupational Stress Questionnaire    Feeling of Stress : Rather much  Social Connections: Socially Integrated (01/08/2023)   Social Connection and Isolation Panel [NHANES]    Frequency of Communication with Friends and Family: Twice a week    Frequency of Social Gatherings with Friends and Family: More than three times a week    Attends Religious Services: 1 to 4 times per year    Active Member of [employer] or Organizations: Yes    Attends Banker Meetings: Never    Marital Status: Married  Catering manager Violence: Not At Risk (01/08/2023)   Humiliation, Afraid, Rape, and Kick questionnaire    Fear of Current or Ex-Partner: No    Emotionally Abused: No    Physically Abused: No    Sexually Abused: No    Family History  Problem Relation Age of Onset   Hypertension Father    Cancer Father    Heart disease Mother    Asthma Son        had as a child   Heart disease Sister    Breast cancer Sister    Hypertension Brother     Past Surgical History:  Procedure Laterality Date   BACK SURGERY     feb 21, 23   CHOLECYSTECTOMY     COLONOSCOPY     ENDOMETRIAL ABLATION  10/2010   HERNIA REPAIR     umbicial hernia   JOINT REPLACEMENT Left 03/14/2019   Dr. Magnus Ivan hip   KNEE ARTHROSCOPY Left    06/07/2017 Dr. August [facility]   KNEE ARTHROSCOPY Left 03/21/2020   Procedure: LEFT KNEE ARTHROSCOPY WITH PARTIAL MEDIAL MENISCECTOMY;  Surgeon: [name], MD;  Location: [facility];  Service: Orthopedics;  Laterality: Left;    KNEE CLOSED REDUCTION Right 12/06/2015   Procedure: CLOSED MANIPULATION RIGHT KNEE;  Surgeon: Kerrin Champagne, MD;  Location: [facility] OR;  Service: Orthopedics;  Laterality: Right;   KNEE CLOSED REDUCTION Right 01/17/2016   Procedure: CLOSED MANIPULATION RIGHT KNEE;  Surgeon: Kerrin Champagne, MD;  Location: [facility] OR;  Service: Orthopedics;  Laterality: Right;   KNEE JOINT MANIPULATION Right 12/06/2015   LACRIMAL TUBE INSERTION Bilateral 03/01/2019   Procedure: LACRIMAL TUBE INSERTION;  Surgeon: Aura Camps, MD;  Location: [facility];  Service: Ophthalmology;  Laterality: Bilateral;   LACRIMAL TUBE REMOVAL Bilateral 05/03/2019   Procedure: BILATERAL NASOLACRIMAL DUCT PROBING, IIRIGATION AND TUBE REMOVAL BOTH EYES;  Surgeon: Aura Camps, MD;  Location: [facility];  Service: Ophthalmology;  Laterality: Bilateral;   LUMBAR LAMINECTOMY/DECOMPRESSION MICRODISCECTOMY N/A 06/03/2015   Procedure: Bilateral lateral recess decompression L2-3, L3-4, L4-5;  Surgeon: Kerrin Champagne, MD;  Location: [facility];  Service: Orthopedics;  Laterality: N/A;   LUMBAR LAMINECTOMY/DECOMPRESSION MICRODISCECTOMY N/A 10/02/2016   Procedure: Right L5-S1 Lateral Recess Decompression  microdiscectomy;  Surgeon: Kerrin Champagne, MD;  Location: [facility] OR;  Service: Orthopedics;  Laterality: N/A;   LUMBAR LAMINECTOMY/DECOMPRESSION MICRODISCECTOMY N/A 07/15/2018   Procedure: LEFT L3-4 MICRODISCECTOMY;  Surgeon: Kerrin Champagne, MD;  Location: [facility] OR;  Service: Orthopedics;  Laterality: N/A;   svd      x 2   TEAR DUCT PROBING Bilateral 03/01/2019   Procedure: TEAR DUCT PROBING WITH IRRIGATION;  Surgeon: Aura Camps, MD;  Location: [facility];  Service: Ophthalmology;  Laterality: Bilateral;   TOTAL HIP ARTHROPLASTY Left 03/14/2019   Procedure: LEFT TOTAL HIP ARTHROPLASTY ANTERIOR APPROACH;  Surgeon: [name], MD;  Location: [facility];  Service: Orthopedics;  Laterality: Left;    TOTAL KNEE ARTHROPLASTY Right 09/06/2015   Procedure: RIGHT TOTAL KNEE ARTHROPLASTY;  Surgeon: Kerrin Champagne, MD;  Location: [facility];  Service: Orthopedics;  Laterality: Right;   TOTAL KNEE REVISION Right 05/12/2022   Procedure: RIGHT TOTAL KNEE REVISION ARTHROPLASTY;  Surgeon: [name], MD;  Location: [facility];  Service: Orthopedics;  Laterality: Right;   TUBAL LIGATION     UPPER GASTROINTESTINAL ENDOSCOPY  04/28/2011    ROS: Review of Systems Negative except as stated above  PHYSICAL EXAM: BP 116/73 (BP Location: Left Arm, Patient Position: Sitting, Cuff Size: Normal)   Pulse 84   Temp 98.3 F (36.8 C) (Oral)   Wt 161 lb (73 kg)   LMP 09/01/2010   SpO2 97%   BMI 25.99 kg/m   Physical Exam  General appearance - alert, well appearing, older African-American female and in no distress Mental status - normal mood, behavior, speech, dress, motor activity, and thought processes Neck - supple, no significant adenopathy Chest - clear to auscultation, no wheezes, rales or rhonchi, symmetric air entry Heart - normal rate, regular rhythm, normal S1, S2, no murmurs, rubs, clicks or gallops Extremities - peripheral pulses normal, no pedal edema, no clubbing or cyanosis      Latest Ref Rng & Units 06/17/2022   10:03 AM 06/01/2022   11:32 AM 05/29/2022    3:44 PM  CMP  Glucose 70 - 99 mg/dL  440  97   BUN 8 - 23 mg/dL  11  9   Creatinine 1.02 - 1.00 mg/dL  7.25  3.66   Sodium 440 - 145 mmol/L  131  134   Potassium 3.5 - 5.1 mmol/L  4.1  4.8   Chloride 98 - 111 mmol/L  94  94   CO2 22 - 32 mmol/L  24  24   Calcium 8.7 - 10.3 mg/dL 9.6  9.7  34.7   Total Protein 6.5 - 8.1 g/dL  7.7  7.8   Total Bilirubin 0.3 - 1.2 mg/dL  0.6  0.4   Alkaline Phos 38 - 126 U/L  120  159   AST 15 - 41 U/L  24  19   ALT 0 - 44 U/L  16  16    Lipid Panel     Component Value Date/Time   CHOL 132 01/05/2022 1215   TRIG 116 01/05/2022 1215   HDL 47 01/05/2022 1215   CHOLHDL 2.8 01/05/2022 1215    CHOLHDL 4.2 07/19/2014 1202   VLDL 24 07/19/2014 1202   LDLCALC 64 01/05/2022 1215    CBC    Component Value Date/Time   WBC 7.4 06/01/2022 1132   RBC 4.08 06/01/2022 1132   HGB 11.0 (L) 06/01/2022 1132   HGB 11.3 05/01/2021 1526   HCT 33.5 (L) 06/01/2022 1132   HCT 34.7 05/01/2021 1526   PLT 449 (H) 06/01/2022 1132   PLT 297 05/01/2021 1526   MCV 82.1 06/01/2022 1132   MCV 81 05/01/2021 1526   MCH 27.0 06/01/2022 1132   MCHC 32.8 06/01/2022 1132   RDW 14.6 06/01/2022 1132   RDW 15.7 (H) 05/01/2021 1526   LYMPHSABS 2.4 06/01/2022 1132   LYMPHSABS 4.1 (H) 10/14/2018 1704  MONOABS 0.5 06/01/2022 1132   EOSABS 0.1 06/01/2022 1132   EOSABS 0.1 10/14/2018 1704   BASOSABS 0.0 06/01/2022 1132   BASOSABS 0.0 10/14/2018 1704    ASSESSMENT AND PLAN: 1. Controlled type 2 diabetes mellitus with diabetic polyneuropathy, without long-term current use of insulin (HCC) At goal.  Continue metformin and Farxiga - POCT glycosylated hemoglobin (Hb A1C) - POCT glucose (manual entry) - metFORMIN (GLUCOPHAGE-XR) 750 MG 24 hr tablet; Take 1 tablet (750 mg total) by mouth daily with breakfast.  Dispense: 90 tablet; Refill: 1 - Microalbumin / creatinine urine ratio  2. Diabetes mellitus treated with oral medication (HCC)   3. Hypertension associated with type 2 diabetes mellitus (HCC) At goal.  Continue Diovan/HCTZ  4. Chronic obstructive pulmonary disease, unspecified COPD type (HCC) Doing well without significant flare recently.  Continue Spiriva - albuterol (PROVENTIL) (2.5 MG/3ML) 0.083% nebulizer solution; USE ONE VIAL FOR UP TO 3 TIMES DAILY.  Dispense: 360 mL; Refill: 0  5. PAD (peripheral artery disease) (HCC) - clopidogrel (PLAVIX) 75 MG tablet; Take 1 tablet (75 mg total) by mouth daily.  Dispense: 90 tablet; Refill: 1 - atorvastatin (LIPITOR) 40 MG tablet; Take 1 tablet (40 mg total) by mouth daily.  Dispense: 90 tablet; Refill: 1    Patient was given the opportunity to  ask questions.  Patient verbalized understanding of the plan and was able to repeat key elements of the plan.   This documentation was completed using Paediatric nurse.  Any transcriptional errors are unintentional.  Orders Placed This Encounter  Procedures   Microalbumin / creatinine urine ratio   POCT glycosylated hemoglobin (Hb A1C)   POCT glucose (manual entry)     Requested Prescriptions   Signed Prescriptions Disp Refills   albuterol (PROVENTIL) (2.5 MG/3ML) 0.083% nebulizer solution 360 mL 0    Sig: USE ONE VIAL FOR UP TO 3 TIMES DAILY.   clopidogrel (PLAVIX) 75 MG tablet 90 tablet 1    Sig: Take 1 tablet (75 mg total) by mouth daily.   atorvastatin (LIPITOR) 40 MG tablet 90 tablet 1    Sig: Take 1 tablet (40 mg total) by mouth daily.   metFORMIN (GLUCOPHAGE-XR) 750 MG 24 hr tablet 90 tablet 1    Sig: Take 1 tablet (750 mg total) by mouth daily with breakfast.   celecoxib (CELEBREX) 200 MG capsule 30 capsule 3    Sig: Take 1 capsule (200 mg total) by mouth daily.    Return in about 4 months (around 05/09/2023).  Jonah Blue, MD, FACP

## 2023-01-11 LAB — MICROALBUMIN / CREATININE URINE RATIO
Creatinine, Urine: 87.1 mg/dL
Microalbumin, Urine: <3 ug/mL

## 2023-01-12 ENCOUNTER — Encounter: Payer: Self-pay | Admitting: Podiatry

## 2023-01-12 ENCOUNTER — Ambulatory Visit (INDEPENDENT_AMBULATORY_CARE_PROVIDER_SITE_OTHER): Payer: Medicaid Other | Admitting: Podiatry

## 2023-01-12 DIAGNOSIS — B351 Tinea unguium: Secondary | ICD-10-CM | POA: Diagnosis not present

## 2023-01-12 DIAGNOSIS — M79676 Pain in unspecified toe(s): Secondary | ICD-10-CM | POA: Diagnosis not present

## 2023-01-12 DIAGNOSIS — E1142 Type 2 diabetes mellitus with diabetic polyneuropathy: Secondary | ICD-10-CM | POA: Diagnosis not present

## 2023-01-13 DIAGNOSIS — F331 Major depressive disorder, recurrent, moderate: Secondary | ICD-10-CM | POA: Diagnosis not present

## 2023-01-17 NOTE — Progress Notes (Signed)
  Subjective:  Patient ID: Stacy [name], female    DOB: May 09, 1960,  MRN: [id number]  Stacy [name] presents to clinic today for at risk foot care with history of diabetic neuropathy and painful, discolored, thick toenails which interfere with daily activities  Chief Complaint  Patient presents with   Diabetes    Patient states she has been good since her last visit , patient saw her pcp on the 5th of this month, patient A1c is 6.3   New problem(s): None.   PCP is Marcine Matar, MD.  Allergies  Allergen Reactions   Other Shortness Of Breath    UNSPECIFIED AGENTS Allergic to perfumes and cleaning products   Shellfish Allergy Anaphylaxis    Per allergy test.   Ace Inhibitors Cough and Other (See Comments)        Celebrex [Celecoxib]      upset stomach  Pt is taking med*   [employer] Other (See Comments)   Aspirin Nausea Only and Other (See Comments)    stomach upset  Other reaction(s): Nausea Only    Review of Systems: Negative except as noted in the HPI.  Objective: No changes noted in today's physical examination. There were no vitals filed for this visit. Stacy Pigeon Privitera is a pleasant 62 y.o. female WD, WN in NAD. AAO x 3.  Vascular Examination: Capillary refill time immediate b/l. Vascular status intact b/l with palpable DP pulses; nonpalpable PT pulses. Pedal hair absent b/l. No edema. No pain with calf compression b/l. Skin temperature gradient WNL b/l. No cyanosis or clubbing noted b/l. No ischemia or gangrene b/l.   Neurological Examination: Sensation grossly intact b/l with 10 gram monofilament. Vibratory sensation intact b/l. Pt has subjective symptoms of neuropathy.  Dermatological Examination: Pedal skin with normal turgor, texture and tone b/l.  No open wounds. No interdigital macerations.   Toenails 1-5 b/l thick, discolored, elongated with subungual debris and pain on dorsal palpation.   No corns, calluses  nor porokeratotic lesions noted.  Musculoskeletal Examination: HAV with bunion bilaterally and hammertoes 2-5 b/l.  Radiographs: None  Last A1c:      Latest Ref Rng & Units 01/08/2023    4:20 PM 09/07/2022   12:28 PM 04/30/2022    9:09 AM  Hemoglobin A1C  Hemoglobin-A1c 0.0 - 7.0 % 6.8  6.2  6.6    Assessment/Plan: 1. Pain due to onychomycosis of toenail   2. Diabetic peripheral neuropathy associated with type 2 diabetes mellitus (HCC)     -Consent given for treatment as described below: -Examined patient. -Continue foot and shoe inspections daily. Monitor blood glucose per PCP/Endocrinologist's recommendations. -Continue supportive shoe gear daily. -Mycotic toenails 1-5 bilaterally were debrided in length and girth with sterile nail nippers and dremel without incident. -Patient/POA to call should there be question/concern in the interim.   Return in about 3 months (around 04/12/2023).  [name], DPM      [facility] LOCATION: [address], [place] [address]                   Office [phone]   [facility] LOCATION: [address] [place], [place] [address] Office [phone]

## 2023-01-20 DIAGNOSIS — F331 Major depressive disorder, recurrent, moderate: Secondary | ICD-10-CM | POA: Diagnosis not present

## 2023-01-20 DIAGNOSIS — T84012D Broken internal right knee prosthesis, subsequent encounter: Secondary | ICD-10-CM | POA: Diagnosis not present

## 2023-01-21 ENCOUNTER — Ambulatory Visit: Payer: Medicaid Other | Admitting: Physician Assistant

## 2023-01-26 DIAGNOSIS — F331 Major depressive disorder, recurrent, moderate: Secondary | ICD-10-CM | POA: Diagnosis not present

## 2023-02-01 ENCOUNTER — Encounter: Payer: Self-pay | Admitting: Physician Assistant

## 2023-02-01 ENCOUNTER — Ambulatory Visit: Payer: Medicaid Other | Admitting: Physician Assistant

## 2023-02-01 ENCOUNTER — Other Ambulatory Visit: Payer: Self-pay | Admitting: Internal Medicine

## 2023-02-01 DIAGNOSIS — M19031 Primary osteoarthritis, right wrist: Secondary | ICD-10-CM

## 2023-02-01 DIAGNOSIS — F331 Major depressive disorder, recurrent, moderate: Secondary | ICD-10-CM | POA: Diagnosis not present

## 2023-02-01 DIAGNOSIS — Z1231 Encounter for screening mammogram for malignant neoplasm of breast: Secondary | ICD-10-CM

## 2023-02-01 NOTE — Progress Notes (Signed)
HPI: Stacy [name] returns today for follow-up of her right status post [facility] joint injection 12/24/2022.  States the injection was helpful for 2 weeks.  Now having severe pain in the right.  She also complains of stiffness in both hands she is using Voltaren gel which she finds somewhat beneficial.  No new injuries.  Review of systems: See HPI otherwise negative  Physical exam: General Well-developed well-nourished female in no acute distress. Right hand: No abnormal warmth erythema.  No rashes.  Positive grind test right thumb.  Tenderness over the [facility] joint.   Impression: Right thumb OA  Plan: Will send her to Dr. Fara Boros for evaluation and treatment of right [facility] joint arthritis.  Continue use of Voltaren gel in the interim.  Questions were encouraged and answered.

## 2023-02-03 DIAGNOSIS — I10 Essential (primary) hypertension: Secondary | ICD-10-CM | POA: Diagnosis not present

## 2023-02-03 DIAGNOSIS — R32 Unspecified urinary incontinence: Secondary | ICD-10-CM | POA: Diagnosis not present

## 2023-02-03 DIAGNOSIS — R159 Full incontinence of feces: Secondary | ICD-10-CM | POA: Diagnosis not present

## 2023-02-05 ENCOUNTER — Other Ambulatory Visit: Payer: Self-pay | Admitting: Physician Assistant

## 2023-02-05 DIAGNOSIS — J3089 Other allergic rhinitis: Secondary | ICD-10-CM

## 2023-02-08 DIAGNOSIS — K589 Irritable bowel syndrome without diarrhea: Secondary | ICD-10-CM | POA: Diagnosis not present

## 2023-02-08 DIAGNOSIS — Z860101 Personal history of adenomatous and serrated colon polyps: Secondary | ICD-10-CM | POA: Diagnosis not present

## 2023-02-08 DIAGNOSIS — K219 Gastro-esophageal reflux disease without esophagitis: Secondary | ICD-10-CM | POA: Diagnosis not present

## 2023-02-10 DIAGNOSIS — F331 Major depressive disorder, recurrent, moderate: Secondary | ICD-10-CM | POA: Diagnosis not present

## 2023-02-11 DIAGNOSIS — E119 Type 2 diabetes mellitus without complications: Secondary | ICD-10-CM | POA: Diagnosis not present

## 2023-02-11 DIAGNOSIS — H2513 Age-related nuclear cataract, bilateral: Secondary | ICD-10-CM | POA: Diagnosis not present

## 2023-02-11 LAB — HM DIABETES EYE EXAM

## 2023-02-17 DIAGNOSIS — F331 Major depressive disorder, recurrent, moderate: Secondary | ICD-10-CM | POA: Diagnosis not present

## 2023-02-20 DIAGNOSIS — T84012D Broken internal right knee prosthesis, subsequent encounter: Secondary | ICD-10-CM | POA: Diagnosis not present

## 2023-02-24 ENCOUNTER — Other Ambulatory Visit (INDEPENDENT_AMBULATORY_CARE_PROVIDER_SITE_OTHER): Payer: Medicaid Other

## 2023-02-24 ENCOUNTER — Encounter: Payer: Self-pay | Admitting: Orthopaedic Surgery

## 2023-02-24 ENCOUNTER — Ambulatory Visit: Payer: Medicaid Other | Admitting: Orthopaedic Surgery

## 2023-02-24 DIAGNOSIS — Z96651 Presence of right artificial knee joint: Secondary | ICD-10-CM

## 2023-02-24 DIAGNOSIS — F331 Major depressive disorder, recurrent, moderate: Secondary | ICD-10-CM | POA: Diagnosis not present

## 2023-02-24 NOTE — Progress Notes (Signed)
The patient is now 9 months status post a right knee revision arthroplasty secondary to aseptic loosening.  We have not x-rayed her knee since July of last year.  He says her knee is doing great.  She does have known right hand pain and issues with her basilar thumb joint.  She does have an appointment with our hand specialist Dr. Fara Boros sometime in the next few weeks for that issue.  She denies any issues with her right knee.  On exam her right knee has excellent flexion and extension with no significant swelling.  The knee feels ligamentously stable.  An AP and lateral of the right knee shows a well-seated revision arthroplasty with no complicating features.  Originally it was done years ago by one of my partners and it became loose with fortunately no evidence of infection.  From the standpoint of her right knee follow-up can be as needed.  If she does develop any issues with that she knows to reach out to [place].  All questions and concerns were addressed and answered.

## 2023-03-03 DIAGNOSIS — F331 Major depressive disorder, recurrent, moderate: Secondary | ICD-10-CM | POA: Diagnosis not present

## 2023-03-04 ENCOUNTER — Ambulatory Visit
Admission: RE | Admit: 2023-03-04 | Discharge: 2023-03-04 | Disposition: A | Discharge status: Home / Self Care | Payer: Medicaid Other | Source: Ambulatory Visit | Attending: Internal Medicine | Admitting: Internal Medicine

## 2023-03-04 DIAGNOSIS — Z1231 Encounter for screening mammogram for malignant neoplasm of breast: Secondary | ICD-10-CM

## 2023-03-05 ENCOUNTER — Encounter: Payer: Self-pay | Admitting: Internal Medicine

## 2023-03-06 DIAGNOSIS — R32 Unspecified urinary incontinence: Secondary | ICD-10-CM | POA: Diagnosis not present

## 2023-03-06 DIAGNOSIS — R159 Full incontinence of feces: Secondary | ICD-10-CM | POA: Diagnosis not present

## 2023-03-06 DIAGNOSIS — I10 Essential (primary) hypertension: Secondary | ICD-10-CM | POA: Diagnosis not present

## 2023-03-08 ENCOUNTER — Other Ambulatory Visit: Payer: Self-pay | Admitting: Internal Medicine

## 2023-03-08 DIAGNOSIS — I152 Hypertension secondary to endocrine disorders: Secondary | ICD-10-CM

## 2023-03-09 ENCOUNTER — Ambulatory Visit (INDEPENDENT_AMBULATORY_CARE_PROVIDER_SITE_OTHER): Payer: Medicaid Other | Admitting: Orthopedic Surgery

## 2023-03-09 ENCOUNTER — Other Ambulatory Visit (INDEPENDENT_AMBULATORY_CARE_PROVIDER_SITE_OTHER): Payer: Self-pay

## 2023-03-09 DIAGNOSIS — M79644 Pain in right finger(s): Secondary | ICD-10-CM | POA: Diagnosis not present

## 2023-03-09 DIAGNOSIS — M1811 Unilateral primary osteoarthritis of first carpometacarpal joint, right hand: Secondary | ICD-10-CM | POA: Diagnosis not present

## 2023-03-09 DIAGNOSIS — G5601 Carpal tunnel syndrome, right upper limb: Secondary | ICD-10-CM

## 2023-03-09 DIAGNOSIS — G8929 Other chronic pain: Secondary | ICD-10-CM | POA: Diagnosis not present

## 2023-03-09 NOTE — Progress Notes (Signed)
 Stacy [name] - 63 y.o. female MRN [id number]  Date of birth: 1960/08/13  Office Visit Note: Visit Date: 03/09/2023 PCP: Vicci Barnie NOVAK, MD Referred by: Vicci Barnie NOVAK, MD  Subjective: No chief complaint on file.  HPI: Stacy [name] is a pleasant 63 y.o. female who presents today for evaluation of ongoing pain at the basilar aspect of the right thumb.  Symptoms have been present for greater than 6 months, worsening in nature.  She is also describing associated numbness and tingling in the thumb, index and long finger with associated nocturnal symptoms.  She has significant pain with grip at the basilar aspect of the thumb which is limiting her ability to perform daily activities.  She has undergone prior bracing and injection of the right thumb CMC joint, most recently in November of this year with minimal relief.  Pertinent ROS were reviewed with the patient and found to be negative unless otherwise specified above in HPI.   Visit Reason: right thumb basilar joint pain with associated numbness and tingling Duration of symptoms: 6+ months Hand dominance: left Occupation: disabled Diabetic: Yes Smoking: No Heart/Lung History: none Blood Thinners:  none  Prior Testing/EMG: xrays 12/24/22 Injections (Date): 12/24/22 Treatments: injection, brace Prior Surgery: none  Assessment & Plan: Visit Diagnoses:  1. Chronic pain of right thumb     Plan: Extensive discussion was had with the patient today regarding her ongoing right thumb CMC osteoarthritis.  X-rays were reviewed in detail today which do show significant degenerative change at the right thumb [facility] articulation.  We discussed treatment modalities ranging from conservative to surgical.  From a conservative standpoint we discussed bracing, injections, anti-inflammatory medications and activity modification.  From a surgical standpoint we discussed [facility] arthroplasty, risks and benefits as well as the  postoperative protocol.  At this juncture, given the severity of symptoms that have remained refractory to conservative care, patient would like to move forward with surgical intervention.  Given the significance of the degenerative change on x-ray which clinically correlates with examination, we can move forward with surgical scheduling of right thumb CMC arthroplasty at the next available date.  She is also demonstrating appropriate clinical criteria for carpal tunnel syndrome.  Given her symptoms consistent with the CTS 6 criteria, we can perform carpal tunnel release in conjunction with the upcoming right thumb [facility] arthroplasty surgery.  Risks and benefits of the procedures were discussed, risks including but not limited to infection, bleeding, scarring, stiffness, nerve injury, tendon injury, vascular injury, hardware complication, recurrence of symptoms and need for subsequent operation.  We also discussed the specifics of the postoperative protocol and the appropriate timeline.  Patient expressed understanding.  We will move forward with surgical scheduling of right thumb CMC arthroplasty and right open carpal tunnel release at the next available date per the patient preference.   Follow-up: No follow-ups on file.   Meds & Orders: No orders of the defined types were placed in this encounter.   Orders Placed This Encounter  Procedures   XR Wrist 2 Views Right     Procedures: No procedures performed      Clinical History: No specialty comments available.  She reports that she has quit smoking. She has never used smokeless tobacco.  Recent Labs    04/30/22 0909 09/07/22 1228 01/08/23 1620  HGBA1C 6.6* 6.2 6.8    Objective:   Vital Signs: LMP 09/01/2010   Physical Exam  Gen: Well-appearing, in no acute distress; non-toxic CV: Regular Rate.  Well-perfused. Warm.  Resp: Breathing unlabored on room air; no wheezing. Psych: Fluid speech in conversation; appropriate affect;  normal thought process  Ortho Exam General: Patient is well appearing and in no distress. Cervical spine mobility is full in all directions:   Skin and Muscle: No skin changes are apparent to upper extremities.  Muscle bulk and contour normal, no signs of atrophy.      Range of Motion and Palpation Tests: Mobility is full about the elbows with flexion and extension.  Forearm supination and pronation are 85/85 bilaterally.  Wrist flexion/extension is 75/65 bilaterally.  Digital flexion and extension are full.  Thumb opposition is full to the base of the small fingers bilaterally.     No cords or nodules are palpated.  No triggering is observed.     Significant tenderness over the right thumb CMC articulation is observed, positive grind for pain, positive crepitus.  MP hyperextension minimal, less than 10 degrees.    Finklestein test mildly positive   Neurologic, Vascular, Motor: Sensation is diminished to light touch in the median/radial/ulnar distributions.  Tinel's testing positive at wrist level. Phalen's positive, Derkan's compression positive.  2-point discrimination in the median nerve distribution of the right hand 8 mm  Fingers pink and well perfused.  Capillary refill is brisk.     Imaging: XR Wrist 2 Views Right Result Date: 03/09/2023 Pantrapezial view of the right wrist was obtained today which show significant degenerative change at the thumb [facility] interval with joint space narrowing, osteophyte formation and subchondral sclerosis.  There is notable degenerative change at the trapezial scaphoid interval as well.   Past Medical/Family/Surgical/Social History: Medications & Allergies reviewed per EMR, new medications updated. Patient Active Problem List   Diagnosis Date Noted   Status post revision of total replacement of right knee 05/12/2022   History of colonic polyps 03/20/2022   Failed total knee, right, subsequent encounter 03/02/2022   Loose right total knee  arthroplasty (HCC) 03/02/2022   Mixed stress and urge urinary incontinence 12/04/2021   Functional fecal incontinence 12/04/2021   IBS (irritable bowel syndrome) 11/19/2021   Spondylolisthesis, lumbar region    Other spondylosis with radiculopathy, lumbar region    Other secondary scoliosis, lumbar region    Fusion of spine of lumbar region 03/25/2021   Colon polyps 06/06/2020   Lupus 06/06/2020   Allergic rhinitis due to animal (cat) (dog) hair and dander 04/08/2020   Allergic rhinitis due to pollen 04/08/2020   Food allergy  04/08/2020   Acute medial meniscus tear, left, subsequent encounter 03/21/2020   Chronic pain of left knee 02/15/2020   Paresthesia of skin 11/16/2019   History of total knee replacement, right 11/16/2019   Tobacco abuse 11/16/2019   Centrilobular emphysema (HCC) 08/10/2019   Incidental lung nodule, > 3mm and < 8mm 08/10/2019   OSA on CPAP 08/10/2019   Dyspnea on exertion 08/02/2019   Hyperlipidemia 08/02/2019   Lumbar radiculopathy 04/24/2019   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   Post laminectomy syndrome 02/23/2019   Abnormality of gait 02/23/2019   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   Unilateral primary osteoarthritis, left hip 12/28/2018   Lesion of skin of left ear 12/26/2018   Primary osteoarthritis of left hip 12/02/2018   Iron  deficiency anemia 10/16/2018   Chronic pain syndrome 09/08/2018   Chronic pain of right knee 08/11/2018   Status post lumbar laminectomy 07/15/2018   Peripheral arterial disease (HCC) 04/05/2018   Moderate persistent asthma without complication 06/29/2017   Environmental  and seasonal allergies 06/29/2017   Controlled type 2 diabetes mellitus with diabetic polyneuropathy, without long-term current use of insulin  (HCC) 06/29/2017   Perennial allergic rhinitis 04/08/2017   Sensorineural hearing loss (SNHL), bilateral 04/08/2017   Chronic pansinusitis 03/25/2017   Eustachian tube dysfunction, bilateral 03/25/2017    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   Herniation of lumbar intervertebral disc with radiculopathy 10/02/2016    Class: Chronic   Alopecia areata 08/19/2016   Chondromalacia of both patellae 06/03/2015    Class: Chronic   Spinal stenosis, lumbar region, with neurogenic claudication 06/03/2015   Tobacco use disorder 04/25/2015   DJD (degenerative joint disease) of knee 01/04/2015   Hemorrhoid 11/14/2014   Gout of big toe 07/19/2014   Essential hypertension 08/14/2013   Gastroesophageal reflux disease without esophagitis 08/14/2013   COPD (chronic obstructive pulmonary disease) (HCC) 04/17/2011   Past Medical History:  Diagnosis Date   Allergy     Shellfish, cleaning products   Anxiety    Arthritis    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   Asthma    COPD (chronic obstructive pulmonary disease) (HCC)    Depression    Diabetes mellitus    Type II   GERD (gastroesophageal reflux disease)    Pt on Protonix  daily   Glaucoma    Gout    Headache(784.0)    otc meds prn   Hyperlipidemia    Hypertension    Irritable bowel syndrome 11/19/2010   Neuropathy    Pneumonia YRS AGO   Restless legs    Shortness of breath    07/14/2018- uses  4 times a day   Sleep apnea    Family History  Problem Relation Age of Onset   Hypertension Father    Cancer Father    Heart disease Mother    Asthma Son        had as a child   Heart disease Sister    Breast cancer Sister    Hypertension Brother    Past Surgical History:  Procedure Laterality Date   BACK SURGERY     feb 21, 23   CHOLECYSTECTOMY     COLONOSCOPY     ENDOMETRIAL ABLATION  10/2010   HERNIA REPAIR     umbicial hernia   JOINT REPLACEMENT Left 03/14/2019   Dr. Vernetta hip   KNEE ARTHROSCOPY Left    06/07/2017 Dr. Addie of [facility]   KNEE ARTHROSCOPY Left 03/21/2020   Procedure: LEFT KNEE ARTHROSCOPY WITH PARTIAL MEDIAL MENISCECTOMY;  Surgeon: Vernetta Lonni GRADE, MD;  Location: [facility];  Service: Orthopedics;  Laterality: Left;   KNEE CLOSED REDUCTION Right 12/06/2015   Procedure: CLOSED MANIPULATION RIGHT KNEE;  Surgeon: Lynwood FORBES Better, MD;  Location: [facility] OR;  Service: Orthopedics;  Laterality: Right;   KNEE CLOSED REDUCTION Right 01/17/2016   Procedure: CLOSED MANIPULATION RIGHT KNEE;  Surgeon: Lynwood FORBES Better, MD;  Location: [facility] OR;  Service: Orthopedics;  Laterality: Right;   KNEE JOINT MANIPULATION Right 12/06/2015   LACRIMAL TUBE INSERTION Bilateral 03/01/2019   Procedure: LACRIMAL TUBE INSERTION;  Surgeon: Jacques Sharper, MD;  Location: [facility];  Service: Ophthalmology;  Laterality: Bilateral;   LACRIMAL TUBE REMOVAL Bilateral 05/03/2019   Procedure: BILATERAL NASOLACRIMAL DUCT PROBING, IIRIGATION AND TUBE REMOVAL BOTH EYES;  Surgeon: Jacques Sharper, MD;  Location: [facility];  Service: Ophthalmology;  Laterality: Bilateral;   LUMBAR LAMINECTOMY/DECOMPRESSION MICRODISCECTOMY N/A 06/03/2015   Procedure: Bilateral lateral recess decompression L2-3, L3-4, L4-5;  Surgeon: Lynwood FORBES Better, MD;  Location: [facility] OR;  Service: Orthopedics;  Laterality: N/A;   LUMBAR LAMINECTOMY/DECOMPRESSION MICRODISCECTOMY N/A 10/02/2016   Procedure: Right L5-S1 Lateral Recess Decompression  microdiscectomy;  Surgeon: Better Lynwood FORBES, MD;  Location: [facility] OR;  Service: Orthopedics;  Laterality: N/A;   LUMBAR LAMINECTOMY/DECOMPRESSION MICRODISCECTOMY N/A 07/15/2018   Procedure: LEFT L3-4 MICRODISCECTOMY;  Surgeon: Better Lynwood FORBES, MD;  [facility] OR;  Service: Orthopedics;  Laterality: N/A;   svd      x 2   TEAR DUCT PROBING Bilateral 03/01/2019   Procedure: TEAR DUCT PROBING WITH IRRIGATION;  Surgeon: Jacques Sharper, MD;  Location: [facility];  Service: Ophthalmology;  Laterality: Bilateral;   TOTAL HIP ARTHROPLASTY Left 03/14/2019   Procedure: LEFT TOTAL HIP ARTHROPLASTY ANTERIOR APPROACH;  Surgeon: Vernetta Lonni GRADE, MD;  Location: [facility]  OR;  Service: Orthopedics;  Laterality: Left;   TOTAL KNEE ARTHROPLASTY Right 09/06/2015   Procedure: RIGHT TOTAL KNEE ARTHROPLASTY;  Surgeon: Lynwood FORBES Better, MD;  Location: [facility] OR;  Service: Orthopedics;  Laterality: Right;   TOTAL KNEE REVISION Right 05/12/2022   Procedure: RIGHT TOTAL KNEE REVISION ARTHROPLASTY;  Surgeon: Vernetta Lonni GRADE, MD;  Location: [facility] OR;  Service: Orthopedics;  Laterality: Right;   TUBAL LIGATION     UPPER GASTROINTESTINAL ENDOSCOPY  04/28/2011   Social History   Occupational History   Occupation: unemployed    Associate Professor: UNEMPLOYED  Tobacco Use   Smoking status: Former   Smokeless tobacco: Never  [employer]   Vaping status: Never Used  Substance and Sexual Activity   Alcohol use: No   Drug use: No   Sexual activity: Yes    Birth control/protection: Surgical, Post-menopausal    Comment: tubal ligation    Brinna Divelbiss Estela) Genavive Kubicki, M.D. [facility]

## 2023-03-09 NOTE — H&P (View-Only) (Signed)
 Stacy [name] - 63 y.o. female MRN [id number]  Date of birth: 1960/08/13  Office Visit Note: Visit Date: 03/09/2023 PCP: Vicci Barnie NOVAK, MD Referred by: Vicci Barnie NOVAK, MD  Subjective: No chief complaint on file.  HPI: Stacy [name] is a pleasant 63 y.o. female who presents today for evaluation of ongoing pain at the basilar aspect of the right thumb.  Symptoms have been present for greater than 6 months, worsening in nature.  She is also describing associated numbness and tingling in the thumb, index and long finger with associated nocturnal symptoms.  She has significant pain with grip at the basilar aspect of the thumb which is limiting her ability to perform daily activities.  She has undergone prior bracing and injection of the right thumb CMC joint, most recently in November of this year with minimal relief.  Pertinent ROS were reviewed with the patient and found to be negative unless otherwise specified above in HPI.   Visit Reason: right thumb basilar joint pain with associated numbness and tingling Duration of symptoms: 6+ months Hand dominance: left Occupation: disabled Diabetic: Yes Smoking: No Heart/Lung History: none Blood Thinners:  none  Prior Testing/EMG: xrays 12/24/22 Injections (Date): 12/24/22 Treatments: injection, brace Prior Surgery: none  Assessment & Plan: Visit Diagnoses:  1. Chronic pain of right thumb     Plan: Extensive discussion was had with the patient today regarding her ongoing right thumb CMC osteoarthritis.  X-rays were reviewed in detail today which do show significant degenerative change at the right thumb [facility] articulation.  We discussed treatment modalities ranging from conservative to surgical.  From a conservative standpoint we discussed bracing, injections, anti-inflammatory medications and activity modification.  From a surgical standpoint we discussed [facility] arthroplasty, risks and benefits as well as the  postoperative protocol.  At this juncture, given the severity of symptoms that have remained refractory to conservative care, patient would like to move forward with surgical intervention.  Given the significance of the degenerative change on x-ray which clinically correlates with examination, we can move forward with surgical scheduling of right thumb CMC arthroplasty at the next available date.  She is also demonstrating appropriate clinical criteria for carpal tunnel syndrome.  Given her symptoms consistent with the CTS 6 criteria, we can perform carpal tunnel release in conjunction with the upcoming right thumb [facility] arthroplasty surgery.  Risks and benefits of the procedures were discussed, risks including but not limited to infection, bleeding, scarring, stiffness, nerve injury, tendon injury, vascular injury, hardware complication, recurrence of symptoms and need for subsequent operation.  We also discussed the specifics of the postoperative protocol and the appropriate timeline.  Patient expressed understanding.  We will move forward with surgical scheduling of right thumb CMC arthroplasty and right open carpal tunnel release at the next available date per the patient preference.   Follow-up: No follow-ups on file.   Meds & Orders: No orders of the defined types were placed in this encounter.   Orders Placed This Encounter  Procedures   XR Wrist 2 Views Right     Procedures: No procedures performed      Clinical History: No specialty comments available.  She reports that she has quit smoking. She has never used smokeless tobacco.  Recent Labs    04/30/22 0909 09/07/22 1228 01/08/23 1620  HGBA1C 6.6* 6.2 6.8    Objective:   Vital Signs: LMP 09/01/2010   Physical Exam  Gen: Well-appearing, in no acute distress; non-toxic CV: Regular Rate.  Well-perfused. Warm.  Resp: Breathing unlabored on room air; no wheezing. Psych: Fluid speech in conversation; appropriate affect;  normal thought process  Ortho Exam General: Patient is well appearing and in no distress. Cervical spine mobility is full in all directions:   Skin and Muscle: No skin changes are apparent to upper extremities.  Muscle bulk and contour normal, no signs of atrophy.      Range of Motion and Palpation Tests: Mobility is full about the elbows with flexion and extension.  Forearm supination and pronation are 85/85 bilaterally.  Wrist flexion/extension is 75/65 bilaterally.  Digital flexion and extension are full.  Thumb opposition is full to the base of the small fingers bilaterally.     No cords or nodules are palpated.  No triggering is observed.     Significant tenderness over the right thumb CMC articulation is observed, positive grind for pain, positive crepitus.  MP hyperextension minimal, less than 10 degrees.    Finklestein test mildly positive   Neurologic, Vascular, Motor: Sensation is diminished to light touch in the median/radial/ulnar distributions.  Tinel's testing positive at wrist level. Phalen's positive, Derkan's compression positive.  2-point discrimination in the median nerve distribution of the right hand 8 mm  Fingers pink and well perfused.  Capillary refill is brisk.     Imaging: XR Wrist 2 Views Right Result Date: 03/09/2023 Pantrapezial view of the right wrist was obtained today which show significant degenerative change at the thumb [facility] interval with joint space narrowing, osteophyte formation and subchondral sclerosis.  There is notable degenerative change at the trapezial scaphoid interval as well.   Past Medical/Family/Surgical/Social History: Medications & Allergies reviewed per EMR, new medications updated. Patient Active Problem List   Diagnosis Date Noted   Status post revision of total replacement of right knee 05/12/2022   History of colonic polyps 03/20/2022   Failed total knee, right, subsequent encounter 03/02/2022   Loose right total knee  arthroplasty (HCC) 03/02/2022   Mixed stress and urge urinary incontinence 12/04/2021   Functional fecal incontinence 12/04/2021   IBS (irritable bowel syndrome) 11/19/2021   Spondylolisthesis, lumbar region    Other spondylosis with radiculopathy, lumbar region    Other secondary scoliosis, lumbar region    Fusion of spine of lumbar region 03/25/2021   Colon polyps 06/06/2020   Lupus 06/06/2020   Allergic rhinitis due to animal (cat) (dog) hair and dander 04/08/2020   Allergic rhinitis due to pollen 04/08/2020   Food allergy  04/08/2020   Acute medial meniscus tear, left, subsequent encounter 03/21/2020   Chronic pain of left knee 02/15/2020   Paresthesia of skin 11/16/2019   History of total knee replacement, right 11/16/2019   Tobacco abuse 11/16/2019   Centrilobular emphysema (HCC) 08/10/2019   Incidental lung nodule, > 3mm and < 8mm 08/10/2019   OSA on CPAP 08/10/2019   Dyspnea on exertion 08/02/2019   Hyperlipidemia 08/02/2019   Lumbar radiculopathy 04/24/2019   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   Post laminectomy syndrome 02/23/2019   Abnormality of gait 02/23/2019   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   Unilateral primary osteoarthritis, left hip 12/28/2018   Lesion of skin of left ear 12/26/2018   Primary osteoarthritis of left hip 12/02/2018   Iron  deficiency anemia 10/16/2018   Chronic pain syndrome 09/08/2018   Chronic pain of right knee 08/11/2018   Status post lumbar laminectomy 07/15/2018   Peripheral arterial disease (HCC) 04/05/2018   Moderate persistent asthma without complication 06/29/2017   Environmental  and seasonal allergies 06/29/2017   Controlled type 2 diabetes mellitus with diabetic polyneuropathy, without long-term current use of insulin  (HCC) 06/29/2017   Perennial allergic rhinitis 04/08/2017   Sensorineural hearing loss (SNHL), bilateral 04/08/2017   Chronic pansinusitis 03/25/2017   Eustachian tube dysfunction, bilateral 03/25/2017    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   Herniation of lumbar intervertebral disc with radiculopathy 10/02/2016    Class: Chronic   Alopecia areata 08/19/2016   Chondromalacia of both patellae 06/03/2015    Class: Chronic   Spinal stenosis, lumbar region, with neurogenic claudication 06/03/2015   Tobacco use disorder 04/25/2015   DJD (degenerative joint disease) of knee 01/04/2015   Hemorrhoid 11/14/2014   Gout of big toe 07/19/2014   Essential hypertension 08/14/2013   Gastroesophageal reflux disease without esophagitis 08/14/2013   COPD (chronic obstructive pulmonary disease) (HCC) 04/17/2011   Past Medical History:  Diagnosis Date   Allergy     Shellfish, cleaning products   Anxiety    Arthritis    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   Asthma    COPD (chronic obstructive pulmonary disease) (HCC)    Depression    Diabetes mellitus    Type II   GERD (gastroesophageal reflux disease)    Pt on Protonix  daily   Glaucoma    Gout    Headache(784.0)    otc meds prn   Hyperlipidemia    Hypertension    Irritable bowel syndrome 11/19/2010   Neuropathy    Pneumonia YRS AGO   Restless legs    Shortness of breath    07/14/2018- uses  4 times a day   Sleep apnea    Family History  Problem Relation Age of Onset   Hypertension Father    Cancer Father    Heart disease Mother    Asthma Son        had as a child   Heart disease Sister    Breast cancer Sister    Hypertension Brother    Past Surgical History:  Procedure Laterality Date   BACK SURGERY     feb 21, 23   CHOLECYSTECTOMY     COLONOSCOPY     ENDOMETRIAL ABLATION  10/2010   HERNIA REPAIR     umbicial hernia   JOINT REPLACEMENT Left 03/14/2019   Dr. Vernetta hip   KNEE ARTHROSCOPY Left    06/07/2017 Dr. Addie of [facility]   KNEE ARTHROSCOPY Left 03/21/2020   Procedure: LEFT KNEE ARTHROSCOPY WITH PARTIAL MEDIAL MENISCECTOMY;  Surgeon: Vernetta Lonni GRADE, MD;  Location: [facility];  Service: Orthopedics;  Laterality: Left;   KNEE CLOSED REDUCTION Right 12/06/2015   Procedure: CLOSED MANIPULATION RIGHT KNEE;  Surgeon: Lynwood FORBES Better, MD;  Location: [facility] OR;  Service: Orthopedics;  Laterality: Right;   KNEE CLOSED REDUCTION Right 01/17/2016   Procedure: CLOSED MANIPULATION RIGHT KNEE;  Surgeon: Lynwood FORBES Better, MD;  Location: [facility] OR;  Service: Orthopedics;  Laterality: Right;   KNEE JOINT MANIPULATION Right 12/06/2015   LACRIMAL TUBE INSERTION Bilateral 03/01/2019   Procedure: LACRIMAL TUBE INSERTION;  Surgeon: Jacques Sharper, MD;  Location: [facility];  Service: Ophthalmology;  Laterality: Bilateral;   LACRIMAL TUBE REMOVAL Bilateral 05/03/2019   Procedure: BILATERAL NASOLACRIMAL DUCT PROBING, IIRIGATION AND TUBE REMOVAL BOTH EYES;  Surgeon: Jacques Sharper, MD;  Location: [facility];  Service: Ophthalmology;  Laterality: Bilateral;   LUMBAR LAMINECTOMY/DECOMPRESSION MICRODISCECTOMY N/A 06/03/2015   Procedure: Bilateral lateral recess decompression L2-3, L3-4, L4-5;  Surgeon: Lynwood FORBES Better, MD;  Location: [facility] OR;  Service: Orthopedics;  Laterality: N/A;   LUMBAR LAMINECTOMY/DECOMPRESSION MICRODISCECTOMY N/A 10/02/2016   Procedure: Right L5-S1 Lateral Recess Decompression  microdiscectomy;  Surgeon: Better Lynwood FORBES, MD;  Location: [facility] OR;  Service: Orthopedics;  Laterality: N/A;   LUMBAR LAMINECTOMY/DECOMPRESSION MICRODISCECTOMY N/A 07/15/2018   Procedure: LEFT L3-4 MICRODISCECTOMY;  Surgeon: Better Lynwood FORBES, MD;  [facility];  Service: Orthopedics;  Laterality: N/A;   svd      x 2   TEAR DUCT PROBING Bilateral 03/01/2019   Procedure: TEAR DUCT PROBING WITH IRRIGATION;  Surgeon: Jacques Sharper, MD;  Location: [facility];  Service: Ophthalmology;  Laterality: Bilateral;   TOTAL HIP ARTHROPLASTY Left 03/14/2019   Procedure: LEFT TOTAL HIP ARTHROPLASTY ANTERIOR APPROACH;  Surgeon: Vernetta Lonni GRADE, MD;  Location: [facility]  OR;  Service: Orthopedics;  Laterality: Left;   TOTAL KNEE ARTHROPLASTY Right 09/06/2015   Procedure: RIGHT TOTAL KNEE ARTHROPLASTY;  Surgeon: Lynwood FORBES Better, MD;  Location: [facility] OR;  Service: Orthopedics;  Laterality: Right;   TOTAL KNEE REVISION Right 05/12/2022   Procedure: RIGHT TOTAL KNEE REVISION ARTHROPLASTY;  Surgeon: Vernetta Lonni GRADE, MD;  Location: [facility] OR;  Service: Orthopedics;  Laterality: Right;   TUBAL LIGATION     UPPER GASTROINTESTINAL ENDOSCOPY  04/28/2011   Social History   Occupational History   Occupation: unemployed    Associate Professor: UNEMPLOYED  Tobacco Use   Smoking status: Former   Smokeless tobacco: Never  Advertising Account Planner   Vaping status: Never Used  Substance and Sexual Activity   Alcohol use: No   Drug use: No   Sexual activity: Yes    Birth control/protection: Surgical, Post-menopausal    Comment: tubal ligation    Brinna Divelbiss Estela) Genavive Kubicki, M.D. [facility]

## 2023-03-10 DIAGNOSIS — F331 Major depressive disorder, recurrent, moderate: Secondary | ICD-10-CM | POA: Diagnosis not present

## 2023-03-17 DIAGNOSIS — F331 Major depressive disorder, recurrent, moderate: Secondary | ICD-10-CM | POA: Diagnosis not present

## 2023-03-19 ENCOUNTER — Encounter (HOSPITAL_BASED_OUTPATIENT_CLINIC_OR_DEPARTMENT_OTHER): Payer: Self-pay | Admitting: Orthopedic Surgery

## 2023-03-19 ENCOUNTER — Other Ambulatory Visit: Payer: Self-pay

## 2023-03-19 ENCOUNTER — Encounter (HOSPITAL_BASED_OUTPATIENT_CLINIC_OR_DEPARTMENT_OTHER)
Admission: RE | Admit: 2023-03-19 | Discharge: 2023-03-19 | Disposition: A | Discharge status: Home / Self Care | Payer: Medicaid Other | Source: Ambulatory Visit | Attending: Orthopedic Surgery | Admitting: Orthopedic Surgery

## 2023-03-19 ENCOUNTER — Telehealth: Payer: Self-pay | Admitting: Internal Medicine

## 2023-03-19 DIAGNOSIS — Z01812 Encounter for preprocedural laboratory examination: Secondary | ICD-10-CM | POA: Diagnosis present

## 2023-03-19 LAB — BASIC METABOLIC PANEL
Anion gap: 14 (ref 5–15)
BUN: 9 mg/dL (ref 8–23)
CO2: 24 mmol/L (ref 22–32)
Calcium: 9.9 mg/dL (ref 8.9–10.3)
Chloride: 101 mmol/L (ref 98–111)
Creatinine, Ser: 0.88 mg/dL (ref 0.44–1.00)
GFR, Estimated: >60 mL/min (ref >60)
Glucose, Bld: 131 mg/dL — ABNORMAL HIGH (ref 70–99)
Potassium: 3.7 mmol/L (ref 3.5–5.1)
Sodium: 139 mmol/L (ref 135–145)

## 2023-03-19 NOTE — Telephone Encounter (Signed)
Patient came in stating Agarwala, Anshul, MD told her to come by PCP office and ask to send an order to stop the blood thinner clopidogrel 75 mg (last dose 2/15) Call April RN if any questions [phone] ( Fax came in will place in pcp box)

## 2023-03-19 NOTE — Progress Notes (Addendum)
   03/19/23 0931  Pre-op Phone Call  Surgery Date Verified 03/26/23  Arrival Time Verified 0600  Surgery Location Verified [facility]  Medical History Reviewed Yes  Is the patient taking a GLP-1 receptor agonist? No  Does the patient have diabetes? Type II  Does the patient use a Continuous Blood Glucose Monitor? No  Is the patient on an insulin pump? No  Has the diabetes coordinator been notified? No  Do you have a history of heart problems? No  Does patient have other implanted devices? No  Patient Teaching Enhanced Recovery;Pre / Post Procedure  Patient educated about smoking cessation 24 hours prior to surgery. N/A Non-Smoker  Patient verbalizes understanding of bowel prep? N/A  THA/TKA patients only:  By your surgery date, will you have been taking narcotics for 90 days or greater? No  Med Rec Completed Yes  Take the Following Meds the Morning of Surgery protonix, hyrdoxyzine, singular, cymbalta  Recent  Lab Work, EKG, CXR? No  NPO (Including gum & candy) After midnight  Allowed clear liquids Water;Gatorade  (diabetics please choose diet or no sugar options)  Patient instructed to stop clear liquids including Carb loading drink at: 0430  Stop Solids, Milk, Candy, and Gum STARTING AT MIDNIGHT  Responsible adult to drive and be with you for 24 hours? Yes  Name & Phone Number for Ride/Caregiver Husband [name]  No Jewelry, money, nail polish or make-up.  No lotions, powders, perfumes. No shaving  48 hrs. prior to surgery. Yes  Contacts, Dentures & Glasses Will Have to be Removed Before OR. Yes  Please bring your ID and Insurance Card the morning of your surgery. (Surgery Centers Only) Yes  Bring any papers or x-rays with you that your surgeon gave you. Yes  Instructed to contact the location of procedure/ provider if they or anyone in their household develops symptoms or tests positive for COVID-19, has close contact with someone who tests positive for COVID, or has known exposure to any  contagious illness. Yes  Call this number the morning of surgery  with any problems that may cancel your surgery. [phone]  Covid-19 Assessment  Have you had a positive COVID-19 test within the previous 90 days? No  COVID Testing Guidance Proceed with the additional questions.  Patient's surgery required a COVID-19 test (cardiothoracic, complex ENT, and bronchoscopies/ EBUS) No  Have you been unmasked and in close contact with anyone with COVID-19 or COVID-19 symptoms within the past 10 days? No  Do you or anyone in your household currently have any COVID-19 symptoms? No   Called Dr. Birdena Jubilee office for plavix hold orders

## 2023-03-24 ENCOUNTER — Ambulatory Visit: Payer: Medicaid Other | Admitting: Orthopedic Surgery

## 2023-03-24 DIAGNOSIS — F331 Major depressive disorder, recurrent, moderate: Secondary | ICD-10-CM | POA: Diagnosis not present

## 2023-03-25 NOTE — Anesthesia Preprocedure Evaluation (Signed)
Anesthesia Evaluation  Patient identified by MRN, date of birth, ID band Patient awake    Reviewed: Allergy & Precautions, NPO status , Patient's Chart, lab work & pertinent test results  Airway Mallampati: II  TM Distance: >3 FB Neck ROM: Full    Dental  (+) Dental Advisory Given, Edentulous Upper, Edentulous Lower   Pulmonary shortness of breath and with exertion, asthma , sleep apnea , pneumonia, COPD, neg recent URI, Patient abstained from smoking., former smoker Pt with chronic cough in preop. She denies recent respiratory infection or fever.   breath sounds clear to auscultation + decreased breath sounds(-) wheezing      Cardiovascular Exercise Tolerance: Poor hypertension, Pt. on medications pulmonary hypertension+ Peripheral Vascular Disease and + DOE   Rhythm:Regular Rate:Normal  Eco 08/2019  1. Left ventricular ejection fraction, by estimation, is 60 to 65%. The left ventricle has normal function. The left ventricle has no regional wall motion abnormalities. Left ventricular diastolic parameters were normal. The average left ventricular global longitudinal strain is -22.4 %. The global longitudinal strain is normal.   2. Right ventricular systolic function is normal. The right ventricular size is normal. There is moderately elevated pulmonary artery systolic pressure. The estimated right ventricular systolic pressure is 43.8 mmHg.   3. The mitral valve is normal in structure. No evidence of mitral valve regurgitation. No evidence of mitral stenosis.   4. The aortic valve is normal in structure. Aortic valve regurgitation is not visualized. No aortic stenosis is present.   5. The inferior vena cava is normal in size with <50% respiratory variability, suggesting right atrial pressure of 8 mmHg.     Neuro/Psych  Headaches PSYCHIATRIC DISORDERS Anxiety Depression     Neuromuscular disease    GI/Hepatic Neg liver ROS,GERD  ,,   Endo/Other  diabetes    Renal/GU negative Renal ROS     Musculoskeletal  (+) Arthritis ,    Abdominal   Peds  Hematology  (+) Blood dyscrasia, anemia   Anesthesia Other Findings   Reproductive/Obstetrics                             Anesthesia Physical Anesthesia Plan  ASA: 4  Anesthesia Plan: Regional   Post-op Pain Management: Regional block* and Tylenol PO (pre-op)*   Induction: Intravenous  PONV Risk Score and Plan: 2 and Propofol infusion, TIVA, Treatment may vary due to age or medical condition, Ondansetron and Dexamethasone  Airway Management Planned: Natural Airway  Additional Equipment:   Intra-op Plan:   Post-operative Plan:   Informed Consent: I have reviewed the patients History and Physical, chart, labs and discussed the procedure including the risks, benefits and alternatives for the proposed anesthesia with the patient or authorized representative who has indicated his/her understanding and acceptance.     Dental advisory given  Plan Discussed with: CRNA  Anesthesia Plan Comments: (Risks of anesthesia explained at length. This includes, but is not limited to, sore throat, damage to teeth, lips gums, tongue and vocal cords, nausea and vomiting, reactions to medications, stroke, heart attack, and death. All patient questions were answered and the patient wishes to proceed. Risks of peripheral nerve block explained at length. This includes, but is not limited to, bleeding, infection, reactions to the medications, seizures, damage to surrounding structures, damage to nerves, permanent weakness, numbness, tingling and pain. All patient questions were answered and patient wishes to proceed with nerve block. )  Anesthesia Quick Evaluation

## 2023-03-26 ENCOUNTER — Other Ambulatory Visit: Payer: Self-pay

## 2023-03-26 ENCOUNTER — Ambulatory Visit (HOSPITAL_BASED_OUTPATIENT_CLINIC_OR_DEPARTMENT_OTHER): Payer: Self-pay | Admitting: Anesthesiology

## 2023-03-26 ENCOUNTER — Ambulatory Visit (HOSPITAL_BASED_OUTPATIENT_CLINIC_OR_DEPARTMENT_OTHER)
Admission: RE | Admit: 2023-03-26 | Discharge: 2023-03-26 | Disposition: A | Discharge status: Home / Self Care | Payer: Medicaid Other | Attending: Orthopedic Surgery | Admitting: Orthopedic Surgery

## 2023-03-26 ENCOUNTER — Encounter (HOSPITAL_BASED_OUTPATIENT_CLINIC_OR_DEPARTMENT_OTHER): Payer: Self-pay | Admitting: Orthopedic Surgery

## 2023-03-26 ENCOUNTER — Ambulatory Visit (HOSPITAL_BASED_OUTPATIENT_CLINIC_OR_DEPARTMENT_OTHER): Payer: Medicaid Other

## 2023-03-26 ENCOUNTER — Encounter (HOSPITAL_BASED_OUTPATIENT_CLINIC_OR_DEPARTMENT_OTHER)
Admission: RE | Disposition: A | Discharge status: Home / Self Care | Payer: Self-pay | Source: Home / Self Care | Attending: Orthopedic Surgery

## 2023-03-26 DIAGNOSIS — G5600 Carpal tunnel syndrome, unspecified upper limb: Secondary | ICD-10-CM | POA: Insufficient documentation

## 2023-03-26 DIAGNOSIS — G4730 Sleep apnea, unspecified: Secondary | ICD-10-CM | POA: Diagnosis not present

## 2023-03-26 DIAGNOSIS — Z8249 Family history of ischemic heart disease and other diseases of the circulatory system: Secondary | ICD-10-CM | POA: Diagnosis not present

## 2023-03-26 DIAGNOSIS — J449 Chronic obstructive pulmonary disease, unspecified: Secondary | ICD-10-CM | POA: Diagnosis not present

## 2023-03-26 DIAGNOSIS — E119 Type 2 diabetes mellitus without complications: Secondary | ICD-10-CM | POA: Diagnosis not present

## 2023-03-26 DIAGNOSIS — G5601 Carpal tunnel syndrome, right upper limb: Secondary | ICD-10-CM | POA: Diagnosis not present

## 2023-03-26 DIAGNOSIS — M1811 Unilateral primary osteoarthritis of first carpometacarpal joint, right hand: Secondary | ICD-10-CM | POA: Diagnosis not present

## 2023-03-26 DIAGNOSIS — Z01818 Encounter for other preprocedural examination: Secondary | ICD-10-CM

## 2023-03-26 DIAGNOSIS — Z87891 Personal history of nicotine dependence: Secondary | ICD-10-CM | POA: Diagnosis not present

## 2023-03-26 DIAGNOSIS — K219 Gastro-esophageal reflux disease without esophagitis: Secondary | ICD-10-CM | POA: Diagnosis not present

## 2023-03-26 DIAGNOSIS — I10 Essential (primary) hypertension: Secondary | ICD-10-CM | POA: Diagnosis not present

## 2023-03-26 DIAGNOSIS — E1151 Type 2 diabetes mellitus with diabetic peripheral angiopathy without gangrene: Secondary | ICD-10-CM | POA: Insufficient documentation

## 2023-03-26 HISTORY — PX: CARPOMETACARPEL SUSPENSION PLASTY: SHX5005

## 2023-03-26 HISTORY — PX: CARPAL TUNNEL RELEASE: SHX101

## 2023-03-26 LAB — GLUCOSE, CAPILLARY
Glucose-Capillary: 126 mg/dL — ABNORMAL HIGH (ref 70–99)
Glucose-Capillary: 165 mg/dL — ABNORMAL HIGH (ref 70–99)

## 2023-03-26 SURGERY — CARPOMETACARPEL (CMC) SUSPENSION PLASTY
Anesthesia: Monitor Anesthesia Care | Site: Wrist | Laterality: Right

## 2023-03-26 MED ORDER — ONDANSETRON HCL 4 MG/2ML IJ SOLN
INTRAMUSCULAR | Status: DC | PRN
  Administered 2023-03-26: 4 mg via INTRAVENOUS

## 2023-03-26 MED ORDER — 0.9 % SODIUM CHLORIDE (POUR BTL) OPTIME
TOPICAL | Status: DC | PRN
  Administered 2023-03-26: 1000 mL

## 2023-03-26 MED ORDER — LACTATED RINGERS IV SOLN: INTRAVENOUS | Status: DC

## 2023-03-26 MED ORDER — MIDAZOLAM HCL 2 MG/2ML IJ SOLN
INTRAMUSCULAR | Status: AC
  Filled 2023-03-26: qty 2

## 2023-03-26 MED ORDER — CEFAZOLIN SODIUM-DEXTROSE 2-4 GM/100ML-% IV SOLN
INTRAVENOUS | Status: AC
  Filled 2023-03-26: qty 100

## 2023-03-26 MED ORDER — PROPOFOL 500 MG/50ML IV EMUL
INTRAVENOUS | Status: DC | PRN
Start: 2023-03-26 — End: 2023-03-26
  Administered 2023-03-26: 150 ug/kg/min via INTRAVENOUS

## 2023-03-26 MED ORDER — LIDOCAINE 2% (20 MG/ML) 5 ML SYRINGE
INTRAMUSCULAR | Status: DC | PRN
  Administered 2023-03-26: 20 mg via INTRAVENOUS

## 2023-03-26 MED ORDER — FENTANYL CITRATE (PF) 100 MCG/2ML IJ SOLN
100.0000 ug | Freq: Once | INTRAMUSCULAR | Status: AC
  Administered 2023-03-26: 50 ug via INTRAVENOUS

## 2023-03-26 MED ORDER — GLYCOPYRROLATE 0.2 MG/ML IJ SOLN
INTRAMUSCULAR | Status: DC | PRN
Start: 2023-03-26 — End: 2023-03-26
  Administered 2023-03-26: .2 mg via INTRAVENOUS

## 2023-03-26 MED ORDER — GLYCOPYRROLATE PF 0.2 MG/ML IJ SOSY
PREFILLED_SYRINGE | INTRAMUSCULAR | Status: AC
  Filled 2023-03-26: qty 1

## 2023-03-26 MED ORDER — ACETAMINOPHEN 500 MG PO TABS
1000.0000 mg | ORAL_TABLET | Freq: Once | ORAL | Status: AC
  Administered 2023-03-26: 1000 mg via ORAL

## 2023-03-26 MED ORDER — SUCCINYLCHOLINE CHLORIDE 200 MG/10ML IV SOSY
PREFILLED_SYRINGE | INTRAVENOUS | Status: AC
  Filled 2023-03-26: qty 20

## 2023-03-26 MED ORDER — FENTANYL CITRATE (PF) 100 MCG/2ML IJ SOLN
INTRAMUSCULAR | Status: AC
  Filled 2023-03-26: qty 2

## 2023-03-26 MED ORDER — EPHEDRINE 5 MG/ML INJ
INTRAVENOUS | Status: AC
  Filled 2023-03-26: qty 5

## 2023-03-26 MED ORDER — OXYCODONE HCL 5 MG PO TABS: 5.0000 mg | ORAL_TABLET | Freq: Four times a day (QID) | ORAL | 0 refills | Status: DC | PRN

## 2023-03-26 MED ORDER — FENTANYL CITRATE (PF) 100 MCG/2ML IJ SOLN: 25.0000 ug | INTRAMUSCULAR | Status: DC | PRN

## 2023-03-26 MED ORDER — DROPERIDOL 2.5 MG/ML IJ SOLN: 0.6250 mg | Freq: Once | INTRAMUSCULAR | Status: DC | PRN

## 2023-03-26 MED ORDER — ROPIVACAINE HCL 5 MG/ML IJ SOLN
INTRAMUSCULAR | Status: DC | PRN
  Administered 2023-03-26: 33 mL via PERINEURAL

## 2023-03-26 MED ORDER — DEXAMETHASONE SODIUM PHOSPHATE 10 MG/ML IJ SOLN
INTRAMUSCULAR | Status: AC
  Filled 2023-03-26: qty 1

## 2023-03-26 MED ORDER — PROPOFOL 10 MG/ML IV BOLUS
INTRAVENOUS | Status: AC
  Filled 2023-03-26: qty 20

## 2023-03-26 MED ORDER — CEFAZOLIN SODIUM-DEXTROSE 2-4 GM/100ML-% IV SOLN
2.0000 g | INTRAVENOUS | Status: AC
  Administered 2023-03-26: 2 g via INTRAVENOUS

## 2023-03-26 MED ORDER — CLONIDINE HCL (ANALGESIA) 100 MCG/ML EP SOLN
EPIDURAL | Status: DC | PRN
  Administered 2023-03-26: 80 ug

## 2023-03-26 MED ORDER — ACETAMINOPHEN 500 MG PO TABS
ORAL_TABLET | ORAL | Status: AC
  Filled 2023-03-26: qty 2

## 2023-03-26 MED ORDER — GELATIN ABSORBABLE 100 EX MISC
CUTANEOUS | Status: DC | PRN
Start: 2023-03-26 — End: 2023-03-26
  Administered 2023-03-26: 1 via TOPICAL

## 2023-03-26 MED ORDER — LIDOCAINE 2% (20 MG/ML) 5 ML SYRINGE
INTRAMUSCULAR | Status: AC
  Filled 2023-03-26: qty 5

## 2023-03-26 MED ORDER — ONDANSETRON HCL 4 MG/2ML IJ SOLN
INTRAMUSCULAR | Status: AC
  Filled 2023-03-26: qty 2

## 2023-03-26 MED ORDER — DEXAMETHASONE SODIUM PHOSPHATE 4 MG/ML IJ SOLN
INTRAMUSCULAR | Status: DC | PRN
Start: 2023-03-26 — End: 2023-03-26
  Administered 2023-03-26 (×2): 5 mg via PERINEURAL

## 2023-03-26 MED ORDER — MEPERIDINE HCL 25 MG/ML IJ SOLN: 6.2500 mg | INTRAMUSCULAR | Status: DC | PRN

## 2023-03-26 SURGICAL SUPPLY — 54 items
ANCHOR REPAIR HAND WRIST (Anchor) IMPLANT
BLADE MINI RND TIP GREEN BEAV (BLADE) ×2 IMPLANT
BLADE SURG 15 STRL LF DISP TIS (BLADE) ×4 IMPLANT
BNDG COHESIVE 4X5 TAN STRL LF (GAUZE/BANDAGES/DRESSINGS) ×2 IMPLANT
BNDG ELASTIC 4INX 5YD STR LF (GAUZE/BANDAGES/DRESSINGS) ×4 IMPLANT
BNDG ESMARK 4X9 LF (GAUZE/BANDAGES/DRESSINGS) ×2 IMPLANT
BNDG GAUZE DERMACEA FLUFF 4 (GAUZE/BANDAGES/DRESSINGS) ×2 IMPLANT
CHLORAPREP W/TINT 26 (MISCELLANEOUS) ×2 IMPLANT
CORD BIPOLAR FORCEPS 12FT (ELECTRODE) ×2 IMPLANT
COVER BACK TABLE 60X90IN (DRAPES) ×2 IMPLANT
CUFF TOURN SGL QUICK 18X4 (TOURNIQUET CUFF) ×2 IMPLANT
DERMABOND ADVANCED .7 DNX12 (GAUZE/BANDAGES/DRESSINGS) IMPLANT
DRAPE HAND 75INX146IN 110IN (DRAPES) ×2 IMPLANT
DRAPE OEC MINIVIEW 54X84 (DRAPES) ×2 IMPLANT
DRAPE SURG 17X23 STRL (DRAPES) ×2 IMPLANT
GAUZE PAD ABD 8X10 STRL (GAUZE/BANDAGES/DRESSINGS) ×2 IMPLANT
GAUZE SPONGE 4X4 12PLY STRL (GAUZE/BANDAGES/DRESSINGS) ×2 IMPLANT
GAUZE STRETCH 2X75IN STRL (MISCELLANEOUS) ×2 IMPLANT
GAUZE XEROFORM 1X8 LF (GAUZE/BANDAGES/DRESSINGS) ×2 IMPLANT
GLOVE BIO SURGEON STRL SZ7.5 (GLOVE) ×4 IMPLANT
GLOVE BIOGEL PI IND STRL 7.5 (GLOVE) ×4 IMPLANT
GOWN STRL REUS W/ TWL LRG LVL3 (GOWN DISPOSABLE) ×4 IMPLANT
GOWN STRL REUS W/TWL XL LVL3 (GOWN DISPOSABLE) IMPLANT
GOWN STRL SURGICAL XL XLNG (GOWN DISPOSABLE) ×4 IMPLANT
K-WIRE DBL .062X4 NSTRL (WIRE) ×2 IMPLANT
KWIRE DBL .062X4 NSTRL (WIRE) ×2 IMPLANT
MANIFOLD NEPTUNE II (INSTRUMENTS) ×2 IMPLANT
NDL HYPO 25X1 1.5 SAFETY (NEEDLE) IMPLANT
NDL HYPO 25X5/8 SAFETYGLIDE (NEEDLE) IMPLANT
NDL SUT 6 .5 CRC .975X.05 MAYO (NEEDLE) IMPLANT
NEEDLE HYPO 25X1 1.5 SAFETY (NEEDLE) IMPLANT
NEEDLE HYPO 25X5/8 SAFETYGLIDE (NEEDLE) IMPLANT
NS IRRIG 1000ML POUR BTL (IV SOLUTION) ×2 IMPLANT
PACK BASIN DAY SURGERY FS (CUSTOM PROCEDURE TRAY) ×2 IMPLANT
PIN GUARD 1.57MM GREEN STERILE (PIN) ×2 IMPLANT
SHEET MEDIUM DRAPE 40X70 STRL (DRAPES) ×2 IMPLANT
SLEEVE SCD COMPRESS KNEE MED (STOCKING) ×2 IMPLANT
SLING ARM FOAM STRAP LRG (SOFTGOODS) IMPLANT
SPIKE FLUID TRANSFER (MISCELLANEOUS) IMPLANT
SPLINT PLASTER CAST XFAST 4X15 (CAST SUPPLIES) ×2 IMPLANT
STOCKINETTE IMPERVIOUS 9X36 MD (GAUZE/BANDAGES/DRESSINGS) ×2 IMPLANT
SUCTION TUBE FRAZIER 10FR DISP (SUCTIONS) ×2 IMPLANT
SUT 0 FIBERLOOP 38 BLUE TPR ND (SUTURE) ×2 IMPLANT
SUT ETHILON 4 0 PS 2 18 (SUTURE) ×2 IMPLANT
SUT MNCRL AB 3-0 PS2 27 (SUTURE) ×2 IMPLANT
SUT MNCRL AB 4-0 PS2 18 (SUTURE) ×2 IMPLANT
SUT VIC AB 3-0 PS2 18XBRD (SUTURE) ×2 IMPLANT
SUTURE 0 FIBERLP 38 BLU TPR ND (SUTURE) IMPLANT
SYR BULB EAR ULCER 3OZ GRN STR (SYRINGE) ×4 IMPLANT
SYR CONTROL 10ML LL (SYRINGE) ×2 IMPLANT
TAPE SUT LABRALTAP WHT/BLK (SUTURE) IMPLANT
TOWEL GREEN STERILE FF (TOWEL DISPOSABLE) ×4 IMPLANT
TUBE CONNECTING 20X1/4 (TUBING) ×2 IMPLANT
UNDERPAD 30X36 HEAVY ABSORB (UNDERPADS AND DIAPERS) ×2 IMPLANT

## 2023-03-26 NOTE — Transfer of Care (Signed)
Immediate Anesthesia Transfer of Care Note  Patient: Stacy [name]  Procedure(s) Performed: RIGHT THUMB CARPOMETACARPEL (CMC)  ARTHROPLASTY WITH INTERNAL BRACE (Right: Hand) RIGHT CARPAL TUNNEL RELEASE (Right: Wrist)  Patient Location: PACU  Anesthesia Type:MAC and Regional  Level of Consciousness: awake  Airway & Oxygen Therapy: Patient Spontanous Breathing and Patient connected to face mask oxygen  Post-op Assessment: Report given to RN and Post -op Vital signs reviewed and stable  Post vital signs: Reviewed and stable  Last Vitals:  Vitals Value Taken Time  BP 144/84 03/26/23 1305  Temp    Pulse 89 03/26/23 1306  Resp 8 03/26/23 1306  SpO2 94 % 03/26/23 1306  Vitals shown include unfiled device data.  Last Pain:  Vitals:   03/26/23 0950  TempSrc: Oral  PainSc: 0-No pain      Patients Stated Pain Goal: 9 (03/26/23 0950)  Complications: No notable events documented.

## 2023-03-26 NOTE — Anesthesia Procedure Notes (Signed)
Anesthesia Regional Block: Axillary brachial plexus block   Pre-Anesthetic Checklist: , timeout performed,  Correct Patient, Correct Site, Correct Laterality,  Correct Procedure, Correct Position, site marked,  Risks and benefits discussed,  Surgical consent,  Pre-op evaluation,  At surgeon's request and post-op pain management  Laterality: Upper and Right  Prep: chloraprep       Needles:  Injection technique: Single-shot  Needle Type: Stimiplex          Additional Needles:   Procedures:,,,, ultrasound used (permanent image in chart),,    Narrative:  Start time: 03/26/2023 10:24 AM End time: 03/26/2023 10:44 AM Injection made incrementally with aspirations every 5 mL.  Performed by: Personally  Anesthesiologist: Lewie Loron, MD  Additional Notes: BP cuff, SpO2 and EKG monitors applied. Sedation begun. Nerve location verified with ultrasound. Anesthetic injected incrementally, slowly, and after neg aspirations under direct u/s guidance. Good perineural spread. Tolerated well.

## 2023-03-26 NOTE — Interval H&P Note (Signed)
History and Physical Interval Note:  03/26/2023 9:26 AM  Stacy [name]  has presented today for surgery, with the diagnosis of RIGHT THUMB CMC ARTHRITIS, RIGHT CARPAL TUNNEL SYNDROME.  The various methods of treatment have been discussed with the patient and family. After consideration of risks, benefits and other options for treatment, the patient has consented to  Procedure(s): RIGHT THUMB CARPOMETACARPEL (CMC)  ARTHROPLASTY WITH INTERNAL BRACE (Right) RIGHT CARPAL TUNNEL RELEASE (Right) as a surgical intervention.  The patient's history has been reviewed, patient examined, no change in status, stable for surgery.  I have reviewed the patient's chart and labs.  Questions were answered to the patient's satisfaction.     Owenn Rothermel

## 2023-03-26 NOTE — Anesthesia Postprocedure Evaluation (Signed)
Anesthesia Post Note  Patient: Stacy [name]  Procedure(s) Performed: RIGHT THUMB CARPOMETACARPEL (CMC)  ARTHROPLASTY WITH INTERNAL BRACE (Right: Hand) RIGHT CARPAL TUNNEL RELEASE (Right: Wrist)     Patient location during evaluation: PACU Anesthesia Type: Regional Level of consciousness: awake and alert Pain management: pain level controlled Vital Signs Assessment: post-procedure vital signs reviewed and stable Respiratory status: spontaneous breathing Cardiovascular status: stable Anesthetic complications: no Comments: Pt vomited in the OR. I do not believe she aspirated. I discussed with her in recovery the incident and she stated "I have real bad acid reflux." I asked, "well talked about that before surgery and you didn't mention that" as I always ask "do you have any acid reflux or hearburn?" In my preoperative questions. She also mentioned she hasn't taken her protonix in days. I said that she should definitely be on the protonix, and to call with any questions or concerns.    No notable events documented.  Last Vitals:  Vitals:   03/26/23 1330 03/26/23 1358  BP:  123/66  Pulse:  77  Resp:    Temp: 36.6 C 36.6 C  SpO2: 95% 94%    Last Pain:  Vitals:   03/26/23 1358  TempSrc:   PainSc: 0-No pain                 Lewie Loron

## 2023-03-26 NOTE — Progress Notes (Addendum)
Assisted Dr. Renold Don with right, infraclavicular, ultrasound guided block. Side rails up, monitors on throughout procedure. See vital signs in flow sheet. Tolerated Procedure well.

## 2023-03-26 NOTE — Discharge Instructions (Signed)

## 2023-03-26 NOTE — Op Note (Addendum)
NAME: Stacy [name] [facility] MEDICAL RECORD NO: [id number] DATE OF BIRTH: 09/02/1960 FACILITY: [facility] LOCATION: [facility] PHYSICIAN: Samuella Cota, MD   OPERATIVE REPORT   DATE OF PROCEDURE: 03/26/23    PREOPERATIVE DIAGNOSIS: Right thumb carpometacarpal arthritis and carpal tunnel syndrome   POSTOPERATIVE DIAGNOSIS: Right thumb carpometacarpal arthritis and carpal tunnel syndrome   PROCEDURE: Right thumb carpometacarpal arthroplasty Right open carpal tunnel release   SURGEON:  Samuella Cota, M.D.   ASSISTANT: Glynn Octave, OPA   ANESTHESIA:  Regional with sedation   INTRAVENOUS FLUIDS:  Per anesthesia flow sheet.   ESTIMATED BLOOD LOSS:  Minimal.   COMPLICATIONS:  None.   SPECIMENS:  none   TOURNIQUET TIME:    Total Tourniquet Time Documented: Upper Arm (Right) - 54 minutes Total: Upper Arm (Right) - 54 minutes    DISPOSITION:  Stable to PACU.   INDICATIONS: This is a 63 year old female who was seen in the outpatient setting and found to have clinical and radiographic evidence of right thumb carpometacarpal arthritis that was refractory to conservative care.  Patient was indicated for right thumb carpometacarpal arthroplasty.  In addition, patient was demonstrating signs and symptoms consistent with evolving carpal tunnel syndrome.  She was evaluated using the CTS 6 criteria in the office setting, met all criteria which indicated her for open carpal tunnel release to be performed in conjunction with her right thumb CMC arthroplasty.  Risks and benefits of surgery were discussed including the risks of infection, bleeding, scarring, stiffness, nerve injury, vascular injury, tendon injury, need for subsequent operation, subsidence, recurrence.  She voiced understanding of these risks and elected to proceed.  OPERATIVE COURSE: Patient was seen and identified in the preoperative area and marked appropriately.  Surgical consent had been signed.  Preoperative IV antibiotic prophylaxis was given. She was transferred to the operating room and placed in supine position with the right upper extremity on an arm board.  Sedation was induced by the anesthesiologist. A regional block had been performed by anesthesia in preoperative holding.    Right upper extremity was prepped and draped in normal sterile orthopedic fashion.  A surgical pause was performed between the surgeons, anesthesia, and operating room staff and all were in agreement as to the patient, procedure, and site of procedure.  Tourniquet was placed and padded appropriately to the right upper arm.  We first began with the carpal tunnel release.  Longitudinal incision was designed in the thenar crease in line with the radial border of the ring finger, from intersection of Kaplan's cardinal line down to level of the distal wrist crease. Incision was carried down utilizing 15 blade. Blunt dissection was performed, palmar fascia was identified and incised sharply utilizing a Beaver blade. Careful dissection was performed down, thenar musculature was bluntly elevated and the transcarpal ligament was identified.  A Beaver blade was then utilized to divide the transcarpal ligament in a distal to proximal fashion.  Fat surrounding the palmar arch was encountered to confirm appropriate distal release.  At the level of the wrist crease, skin flaps were elevated to allow for release of the proximal portion of transverse carpal ligament as well as the antebrachial fascia into the forearm.  Significant hourglass deformity of the median nerve was appreciated intraoperatively, indicating ongoing compression.  Appropriate decompression was noted of the median nerve, care was taken to protect the nerve in its entirety throughout.    We then turned our attention to the right thumb CMC arthroplasty.  A straight line incision  was made over the dorsum of the right thumb CMC joint at the glabrous/nonglabrous junction.   Crossing branches of the radial sensory nerve were identified and carefully protected.  The radial artery was identified and protected.  The first extensor compartment was first released.  The tendons of the first tensor compartment were identified within the incisional site.  The dorsal most aspect of the tendon sheath was then released and carried down over the radial styloid.  Following release of the first extensor compartment, CMC arthrotomy was completed.  Thick capsular flaps were elevated both radially and ulnarly.  The trapezium was identified and confirmed with biplanar fluoroscopy.  This was carefully freed and dissected, then removed utilizing a rongeur.  Care was taken to avoid injury to the underlying flexor carpi radialis tendon.  At this point, the Arthrex internal brace was utilized.  Base of the index metacarpal was identified utilizing biplanar fluoroscopy, the K wire was inserted into the nonarticular portion followed by drill and the first suture anchor.  Biplanar fluoroscopy was utilized to confirm appropriate positioning of the wire prior to anchor placement.  Stability was noted to this suture anchor.  Subsequently, the thumb metacarpal base was identified followed by placement of the additional suture anchor and suspension of the internal brace.  Thumb was held in gently abducted position for internal brace placement.  Biplanar fluoroscopy was utilized to confirm appropriate thumb metacarpal positioning, no significant subsidence was noted with gentle stress testing.  At this stage in the procedure, the FCR tendon was identified and the depth of the wound.  The APL tendons were identified at their distal extent overlying the base the metacarpal.  An 0 FiberWire was utilized to create a tendon transfer between the APL tendon slips and the FCR with traction on the thumb.  Excellent stability was noted.  The void created by the trapeziectomy was filled with a Gelfoam.  The capsular flaps  were repaired utilizing 3-0 Vicryl suture in figure-of-eight fashion.  The tourniquet was deflated at 54 minutes.  Fingertips were pink with brisk capillary refill after deflation of tourniquet.  Copious irrigation was performed of both the thumb CMC incision as well as the carpal tunnel incision.  Incision for the thumb was closed in layers utilizing 3-0 Vicryl for the subcutaneous tissue and a 4-0 nylon for the skin surface.  Carpal tunnel incision was closed utilized 4-0 nylon in horizontal mattress fashion.  Sterile dressings were provided followed by application of a thumb spica splint utilizing plaster.  The operative drapes were broken down.  The patient was awoken from anesthesia safely and taken to PACU in stable condition.  I will see her back in the office in 2 weeks for postoperative followup.    Post-operative plan: The patient will recover in the post-anesthesia care unit and then be discharged home.  The patient will be non weight bearing on the right upper extremity in a spica splint.   I will see the patient back in the office in 2 weeks for postoperative followup.  Discharge instructions were provided for appropriate dressing care as well as pain medication.  Samuella Cota, MD Electronically signed, 03/26/23

## 2023-03-26 NOTE — Addendum Note (Signed)
Addendum  created 03/26/23 1434 by Lewie Loron, MD   Clinical Note Signed

## 2023-03-29 ENCOUNTER — Encounter (HOSPITAL_BASED_OUTPATIENT_CLINIC_OR_DEPARTMENT_OTHER): Payer: Self-pay | Admitting: Orthopedic Surgery

## 2023-03-30 ENCOUNTER — Other Ambulatory Visit: Payer: Self-pay | Admitting: Orthopedic Surgery

## 2023-03-30 DIAGNOSIS — M1811 Unilateral primary osteoarthritis of first carpometacarpal joint, right hand: Secondary | ICD-10-CM

## 2023-03-31 DIAGNOSIS — F331 Major depressive disorder, recurrent, moderate: Secondary | ICD-10-CM | POA: Diagnosis not present

## 2023-04-02 ENCOUNTER — Ambulatory Visit: Payer: Medicaid Other | Attending: Orthopedic Surgery | Admitting: Occupational Therapy

## 2023-04-02 ENCOUNTER — Other Ambulatory Visit: Payer: Self-pay

## 2023-04-02 DIAGNOSIS — M25541 Pain in joints of right hand: Secondary | ICD-10-CM | POA: Insufficient documentation

## 2023-04-02 DIAGNOSIS — R29898 Other symptoms and signs involving the musculoskeletal system: Secondary | ICD-10-CM | POA: Insufficient documentation

## 2023-04-02 DIAGNOSIS — M6281 Muscle weakness (generalized): Secondary | ICD-10-CM | POA: Insufficient documentation

## 2023-04-02 DIAGNOSIS — R29818 Other symptoms and signs involving the nervous system: Secondary | ICD-10-CM | POA: Diagnosis present

## 2023-04-02 DIAGNOSIS — R278 Other lack of coordination: Secondary | ICD-10-CM | POA: Insufficient documentation

## 2023-04-02 DIAGNOSIS — R208 Other disturbances of skin sensation: Secondary | ICD-10-CM | POA: Insufficient documentation

## 2023-04-02 DIAGNOSIS — M1811 Unilateral primary osteoarthritis of first carpometacarpal joint, right hand: Secondary | ICD-10-CM | POA: Insufficient documentation

## 2023-04-02 NOTE — Therapy (Addendum)
 OUTPATIENT OCCUPATIONAL THERAPY ORTHO EVALUATION  Patient Name: Stacy [name] [facility] MRN: [id number] DOB:1960-02-05, 63 y.o., female Today's Date: 04/05/2023  PCP: Marcine Matar, MD  REFERRING PROVIDER: Samuella Cota, MD   END OF SESSION:    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   Asthma    COPD (chronic obstructive pulmonary disease) (HCC)    Depression    Diabetes mellitus    Type II   GERD (gastroesophageal reflux disease)    Pt on Protonix daily   Glaucoma    Gout    Headache(784.0)    otc meds prn   Hyperlipidemia    Hypertension    Irritable bowel syndrome 11/19/2010   Neuropathy    Pneumonia YRS AGO   Restless legs    Shortness of breath    07/14/2018- uses  4 times a day   Sleep apnea    Past Surgical History:  Procedure Laterality Date   BACK SURGERY     feb 21, 23   CARPAL TUNNEL RELEASE Right 03/26/2023   Procedure: RIGHT CARPAL TUNNEL RELEASE;  Surgeon: Samuella Cota, MD;  Location: [facility];  Service: Orthopedics;  Laterality: Right;   CARPOMETACARPEL SUSPENSION PLASTY Right 03/26/2023   Procedure: RIGHT THUMB CARPOMETACARPEL (CMC)  ARTHROPLASTY WITH INTERNAL BRACE;  Surgeon: Samuella Cota, MD;  Location: [facility];  Service: Orthopedics;  Laterality: Right;   CHOLECYSTECTOMY     COLONOSCOPY     ENDOMETRIAL ABLATION  10/2010   HERNIA REPAIR     umbicial hernia   JOINT REPLACEMENT Left 03/14/2019   Dr. Magnus Ivan hip   KNEE ARTHROSCOPY Left    06/07/2017 Dr. August [facility]   KNEE ARTHROSCOPY Left 03/21/2020   Procedure: LEFT KNEE ARTHROSCOPY WITH PARTIAL MEDIAL MENISCECTOMY;  Surgeon: [name], MD;  Location: [facility];  Service: Orthopedics;  Laterality: Left;   KNEE CLOSED REDUCTION Right 12/06/2015   Procedure: CLOSED MANIPULATION RIGHT  KNEE;  Surgeon: Kerrin Champagne, MD;  [facility];  Service: Orthopedics;  Laterality: Right;   KNEE CLOSED REDUCTION Right 01/17/2016   Procedure: CLOSED MANIPULATION RIGHT KNEE;  Surgeon: Kerrin Champagne, MD;  Location: [facility];  Service: Orthopedics;  Laterality: Right;   KNEE JOINT MANIPULATION Right 12/06/2015   LACRIMAL TUBE INSERTION Bilateral 03/01/2019   Procedure: LACRIMAL TUBE INSERTION;  Surgeon: Aura Camps, MD;  Location: [facility];  Service: Ophthalmology;  Laterality: Bilateral;   LACRIMAL TUBE REMOVAL Bilateral 05/03/2019   Procedure: BILATERAL NASOLACRIMAL DUCT PROBING, IIRIGATION AND TUBE REMOVAL BOTH EYES;  Surgeon: Aura Camps, MD;  Location: [facility];  Service: Ophthalmology;  Laterality: Bilateral;   LUMBAR LAMINECTOMY/DECOMPRESSION MICRODISCECTOMY N/A 06/03/2015   Procedure: Bilateral lateral recess decompression L2-3, L3-4, L4-5;  Surgeon: Kerrin Champagne, MD;  [facility];  Service: Orthopedics;  Laterality: N/A;   LUMBAR LAMINECTOMY/DECOMPRESSION MICRODISCECTOMY N/A 10/02/2016   Procedure: Right L5-S1 Lateral Recess Decompression  microdiscectomy;  Surgeon: Kerrin Champagne, MD;  Location: [facility];  Service: Orthopedics;  Laterality: N/A;   LUMBAR LAMINECTOMY/DECOMPRESSION MICRODISCECTOMY N/A 07/15/2018   Procedure: LEFT L3-4 MICRODISCECTOMY;  Surgeon: Kerrin Champagne, MD;  Location: [facility] OR;  Service: Orthopedics;  Laterality: N/A;   svd      x 2   TEAR DUCT PROBING Bilateral 03/01/2019  Procedure: TEAR DUCT PROBING WITH IRRIGATION;  Surgeon: Aura Camps, MD;  Location: [facility];  Service: Ophthalmology;  Laterality: Bilateral;   TOTAL HIP ARTHROPLASTY Left 03/14/2019   Procedure: LEFT TOTAL HIP ARTHROPLASTY ANTERIOR APPROACH;  Surgeon: [name], MD;  [facility];  Service: Orthopedics;  Laterality: Left;   TOTAL KNEE ARTHROPLASTY Right 09/06/2015   Procedure: RIGHT TOTAL KNEE  ARTHROPLASTY;  Surgeon: Kerrin Champagne, MD;  Location[facility];  Service: Orthopedics;  Laterality: Right;   TOTAL KNEE REVISION Right 05/12/2022   Procedure: RIGHT TOTAL KNEE REVISION ARTHROPLASTY;  Surgeon: [name], MD;  Location: [facility];  Service: Orthopedics;  Laterality: Right;   TUBAL LIGATION     UPPER GASTROINTESTINAL ENDOSCOPY  04/28/2011   Patient Active Problem List   Diagnosis Date Noted   Arthritis of carpometacarpal [facility]) joint of right thumb 03/26/2023   Carpal tunnel syndrome, right upper limb 03/26/2023   Status post revision of total replacement of right knee 05/12/2022   History of colonic polyps 03/20/2022   Failed total knee, right, subsequent encounter 03/02/2022   Loose right total knee arthroplasty (HCC) 03/02/2022   Mixed stress and urge urinary incontinence 12/04/2021   Functional fecal incontinence 12/04/2021   IBS (irritable bowel syndrome) 11/19/2021   Spondylolisthesis, lumbar region    Other spondylosis with radiculopathy, lumbar region    Other secondary scoliosis, lumbar region    Fusion of spine of lumbar region 03/25/2021   Colon polyps 06/06/2020   Lupus 06/06/2020   Allergic rhinitis due to animal (cat) (dog) hair and dander 04/08/2020   Allergic rhinitis due to pollen 04/08/2020   Food allergy 04/08/2020   Acute medial meniscus tear, left, subsequent encounter 03/21/2020   Chronic pain of left knee 02/15/2020   Paresthesia of skin 11/16/2019   History of total knee replacement, right 11/16/2019   Tobacco abuse 11/16/2019   Centrilobular emphysema (HCC) 08/10/2019   Incidental lung nodule, > 3mm and < 8mm 08/10/2019   OSA on CPAP 08/10/2019   Dyspnea on exertion 08/02/2019   Hyperlipidemia 08/02/2019   Lumbar radiculopathy 04/24/2019   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   Post laminectomy syndrome 02/23/2019   Abnormality of gait 02/23/2019   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   Unilateral primary osteoarthritis, left hip  12/28/2018   Lesion of skin of left ear 12/26/2018   Primary osteoarthritis of left hip 12/02/2018   Iron deficiency anemia 10/16/2018   Chronic pain syndrome 09/08/2018   Chronic pain of right knee 08/11/2018   Status post lumbar laminectomy 07/15/2018   Peripheral arterial disease (HCC) 04/05/2018   Moderate persistent asthma without complication 06/29/2017   Environmental and seasonal allergies 06/29/2017   Controlled type 2 diabetes mellitus with diabetic polyneuropathy, without long-term current use of insulin (HCC) 06/29/2017   Perennial allergic rhinitis 04/08/2017   Sensorineural hearing loss (SNHL), bilateral 04/08/2017   Chronic pansinusitis 03/25/2017   Eustachian tube dysfunction, bilateral 03/25/2017   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   Herniation of lumbar intervertebral disc with radiculopathy 10/02/2016    Class: Chronic   Alopecia areata 08/19/2016   Chondromalacia of both patellae 06/03/2015    Class: Chronic   Spinal stenosis, lumbar region, with neurogenic claudication 06/03/2015   Tobacco use disorder 04/25/2015   DJD (degenerative joint disease) of knee 01/04/2015   Hemorrhoid 11/14/2014   Gout of big toe 07/19/2014   Essential hypertension 08/14/2013   Gastroesophageal reflux disease without esophagitis 08/14/2013   COPD (chronic  obstructive pulmonary disease) (HCC) 04/17/2011    ONSET DATE: 03/30/2023 (referral date), 03/26/23 (date of surgery, s/p R thumb CMC arthroplasty, R open carpal tunnel release)  REFERRING DIAG: M18.11 (ICD-10-CM) - Arthritis of carpometacarpal (CMC) joint of right thumb  THERAPY DIAG:  Other symptoms and signs involving the musculoskeletal system - Plan: Ot plan of care cert/re-cert  Other symptoms and signs involving the nervous system - Plan: Ot plan of care cert/re-cert  Muscle weakness (generalized) - Plan: Ot plan of care cert/re-cert  Other lack of coordination - Plan: Ot plan of care cert/re-cert  Other disturbances of  skin sensation - Plan: Ot plan of care cert/re-cert  Pain in joint of right hand - Plan: Ot plan of care cert/re-cert  Rationale for Evaluation and Treatment: Rehabilitation  SUBJECTIVE:   SUBJECTIVE STATEMENT: Pt currently wearing sling of RUE with affected hand wrapped in Ace wrap as bulky compressive dressing. Pt reported upcoming appointment with Dr. Fara Boros on Thursday and will provide permanent brace at that time to wear "for next 3 months or so." Pt unsure if Dr. Jamie Kato office to provide splint or fabricating splint in OT. Per pt report: Not currently smoking, quit smoking last year.  OT provided option to pt to postpone OT eval until after pt's MD visit scheduled for 04/08/23. OT educated pt on current precautions and unable to assess several areas secondary to precautions and presence of bulky compressive dressing. Pt acknowledged understanding and requested to continue with eval today despite current precautions (NWB RUE).  Pt accompanied by: self  PERTINENT HISTORY: 03/26/23 R thumb CMC arthroplasty and R open carpal tunnel release, s/p R TKA 2024, lupus, L hip total arthroplasty, tobacco abuse, hyperlipidemia, OSA on CPAP, essential HTN, COPD, GERD, HPV   03/30/23 Referral Notes:  Splint fabrication S/p [facility] arthroplasty *Thursday  March 6th after 9 am  PRECAUTIONS: Per 5th Edition [place] Hand protocol, Fall, Other:    Per 03/26/23 OP Note: The patient will be non weight bearing on the right upper extremity in a spica splint. I will see the patient back in the office in 2 weeks for postoperative followup.   RED FLAGS: Hx of lupus   WEIGHT BEARING RESTRICTIONS: Yes NWB R hand  PAIN:  Are you having pain? Yes: NPRS scale: 5-6 out of 10 Pain location: both front and back and back of R thumb Pain description: sharp sometimes Aggravating factors: moving a certain way or sleeping with hand hanging down Relieving factors: rest, propping hand in bed  FALLS: Has patient fallen in last 6  months? Yes. Number of falls 1 L knee buckled on Friday last week at surgery center, pt reported no injuries, assistance from nurses  to get up  LIVING ENVIRONMENT: Lives with: lives with their spouse Lives in: House/apartment Stairs: Yes: Internal: 12 steps; none and External: 2 steps; on right going up and on left going up   PLOF: Independent with basic ADLs, transportation service for community mobility  PATIENT GOALS: "improvement and mobility with the hand"  NEXT MD VISIT: 04/08/23 office visit with Dr. Fara Boros scheduled  OBJECTIVE:  Note: Objective measures were completed at Evaluation unless otherwise noted.  HAND DOMINANCE: Left  ADLs: Eating: ind Grooming: denture care, some difficulty using one hand to replace/remove dentures Upper body dressing: ind with pullover shirt with extra time, some pain of affected UE Lower body dressing: ind with extra time, difficulty with zippers d/t using L  hand Toileting: ind Bathing: sponge bathing, difficulty d/t one hand, assistance  from spouse to wash back Tub shower transfers: currently sponge bathing  FUNCTIONAL OUTCOME MEASURES: Quick Dash: 90.9% deficit     UPPER EXTREMITY ROM:     Active ROM Right eval Left eval  Shoulder flexion Unable to fully assess d/t presence of sling [facility]  Shoulder abduction Unable to fully assess d/t presence of sling   Shoulder adduction    Shoulder extension    Shoulder internal rotation    Shoulder external rotation    Elbow flexion WFL   Elbow extension WFL   Wrist flexion    Wrist extension    Wrist ulnar deviation    Wrist radial deviation    Wrist pronation    Wrist supination    (Blank rows = not tested)  04/02/23 - Unable to assess ROM of affected hand d/t recent surgery and current precautions and presence of bulky compressive dressing.  Active ROM Right eval Left eval  Thumb MCP (0-60)      WFL                           Thumb IP (0-80)    Thumb Radial abd/add  (0-55)    Thumb Palmar abd/add (0-45)    Thumb Opposition to Small Finger    Index MCP (0-90)    Index PIP (0-100)    Index DIP (0-70)     Long MCP (0-90)     Long PIP (0-100)     Long DIP (0-70)     Ring MCP (0-90)     Ring PIP (0-100)     Ring DIP (0-70)     Little MCP (0-90)     Little PIP (0-100)     Little DIP (0-70)     (Blank rows = not tested)  HAND FUNCTION: Grip strength: Unable to assess secondary to current precautions  COORDINATION: Unable to assess secondary to current precautions  SENSATION: Moderate tingling/numbness in B fingers  EDEMA: Mild in fingers, unable to assess full hand d/t presence of bulky compressive covering  COGNITION: Overall cognitive status: Within functional limits for tasks assessed  OBSERVATIONS: Pt was pleasant and appeared well-kept. Pt currently wearing sling of RUE with affected hand wrapped in Ace wrap as bulky compressive dressing.   TREATMENT DATE: 04/02/23                                                                                                                            Self-Care R Elbow flex/ext - 10 reps - v/c to only complete 10 reps. R Digits 2-5 AROM "wiggle fingers" in bulky compressive dressing. OT educated pt on managing swelling with gentle movement of R digits 2-5, techniques to don/doff sling safely, One handed techniques for UB dressing, UE anatomy, precautions, sleep positioning (Handout provided, see pt instructions), smoking risks and impact on healing. Pt acknowledged understanding of all.   PATIENT EDUCATION: Education details: see today's tx above Person educated:  Patient Education method: Explanation Education comprehension: verbalized understanding  HOME EXERCISE PROGRAM: TBD  GOALS: Goals reviewed with patient? Yes  SHORT TERM GOALS: Target date: 04/23/23  Pt will obtain protective, custom forearm based thumb spica splint/orthotic and ind recall splint wear/care schedule. Baseline: Pt  currently has compressive bulky dressing on RUE with sling. Goal status: INITIAL  2.  Pt will demo understanding of initial RUE HEP to improve pain levels and prerequisite motion for functional use of her R hand/thumb. Baseline: new to outpt OT Goal status: INITIAL  3.  Pt will return demo of scar management techniques and edema management strategies. Baseline: new to outpt OT Goal status: INITIAL  LONG TERM GOALS: Target date: 05/14/23  Pt will be ind with updated RUE HEP Baseline: new to outpt OT Goal status: INITIAL  2.  Patient will demonstrate at least 16% improvement with quick Dash score (reporting 74.9% disability or less) indicating improved functional use of affected extremity.  Baseline: 90.9% deficit  Goal status: INITIAL  3.  Pt will improve grip strength in R hand from unable to assess to at least 15 lbs for functional use at home and in ADLs/IADLs per precautions and protocol. Baseline: unable to assess secondary to current precautions Goal status: INITIAL  4.  Pt will improve A/ROM in R thumb to PIP (or greater) of R small finger, to have functional motion for tasks like ADL's, reach and grasp  Baseline: unable to assess AROM secondary to current precautions and presence of compressive bulky dressing. Goal status: INITIAL  5.  Pt will improve R hand functional use to at least be Mod I for basic ADL's (ie, brushing her hair, bathing, brushing her teeth and folding laundry) to assist in ability to carry out self-care and higher-level home-care tasks with less difficulty. Baseline: unable to assess, compensating with L hand for all tasks d/t NWB RUE Goal status: INITIAL  6. Patient will demo improved FM coordination as evidenced by completing nine-hole peg with use of RUE in 40 seconds or less.  Baseline: unable to assess secondary to current precautions and presence of bulky compressive dressing.  Goal Status: INITIAL  ASSESSMENT:  CLINICAL IMPRESSION: Patient is  a 63 y.o. female who was seen today for occupational therapy evaluation for Arthritis of carpometacarpal (CMC) joint of right thumb and s/p R thumb CMC arthroplasty, R open carpal tunnel release. Hx includes 03/26/23 R thumb CMC arthroplasty and R open carpal tunnel release, s/p R TKA 2024, lupus, L hip total arthroplasty, tobacco abuse, hyperlipidemia, OSA on CPAP, essential HTN, COPD, GERD, HPV. Patient currently presents below baseline level of functioning demonstrating functional deficits and impairments as noted below. Pt would benefit from skilled OT services in the outpatient setting to work on impairments as noted below to help pt return to PLOF as able.     PERFORMANCE DEFICITS: in functional skills including ADLs, IADLs, coordination, dexterity, proprioception, sensation, edema, ROM, strength, pain, Fine motor control, Gross motor control, mobility, body mechanics, endurance, wound, skin integrity, and UE functional use, cognitive skills including  none , and psychosocial skills including environmental adaptation.   IMPAIRMENTS: are limiting patient from ADLs, IADLs, rest and sleep, leisure, and social participation.   COMORBIDITIES: may have co-morbidities  that affects occupational performance. Patient will benefit from skilled OT to address above impairments and improve overall function.  MODIFICATION OR ASSISTANCE TO COMPLETE EVALUATION: Min-Moderate modification of tasks or assist with assess necessary to complete an evaluation.  OT OCCUPATIONAL PROFILE AND HISTORY: Detailed  assessment: Review of records and additional review of physical, cognitive, psychosocial history related to current functional performance.  CLINICAL DECISION MAKING: Moderate - several treatment options, min-mod task modification necessary  REHAB POTENTIAL: Good  EVALUATION COMPLEXITY: Moderate      PLAN:  OT FREQUENCY: 1-2x/week (1x per week for first week to fabricate splint, 2x per week for 6 weeks for  remainder of POC)   OT DURATION: 7 weeks + eval  PLANNED INTERVENTIONS: 97168 OT Re-evaluation, 97535 self care/ADL training, 16109 therapeutic exercise, 97530 therapeutic activity, 97112 neuromuscular re-education, 97140 manual therapy, 97035 ultrasound, 97018 paraffin, 60454 fluidotherapy, 97010 moist heat, 97010 cryotherapy, 97760 Orthotics management and training, 09811 Splinting (initial encounter), [id number] Subsequent splinting/medication, passive range of motion, functional mobility training, energy conservation, patient/family education, and DME and/or AE instructions  RECOMMENDED OTHER SERVICES: N/A  CONSULTED AND AGREED WITH PLAN OF CARE: Patient  PLAN FOR NEXT SESSION:  Fabricate splint after 04/08/23 Dr. Fara Boros Office visit - update STG #1 Splint wear/care handout Review specific goals with pt Initiate initial RUE HEP Exercises s/p s/p 10-14 days -Shoulder and elbow AROM -Digit AROM - Seated Thumb IP Flexion AROM with Blocking  - 3-4 x daily - 7 x weekly - 1 sets - 10 reps - 3-5 hold - Finger O  - 3-4 x daily - 7 x weekly - 1 sets - 10 reps - 3-5 hold - Anterior Wrist Scar Massage If incision site is closed   For all possible CPT codes, reference the Planned Interventions line above.     Check all conditions that are expected to impact treatment: {Conditions expected to impact treatment:Musculoskeletal disorders and Active major medical illness  (Lupus)  If treatment provided at initial evaluation, no treatment charged due to lack of authorization.        [name], OT 04/05/2023, 9:58 AM

## 2023-04-03 DIAGNOSIS — R32 Unspecified urinary incontinence: Secondary | ICD-10-CM | POA: Diagnosis not present

## 2023-04-03 DIAGNOSIS — R159 Full incontinence of feces: Secondary | ICD-10-CM | POA: Diagnosis not present

## 2023-04-03 DIAGNOSIS — I10 Essential (primary) hypertension: Secondary | ICD-10-CM | POA: Diagnosis not present

## 2023-04-06 ENCOUNTER — Other Ambulatory Visit: Payer: Self-pay | Admitting: Physician Assistant

## 2023-04-06 ENCOUNTER — Ambulatory Visit: Payer: Medicaid Other | Admitting: Orthopedic Surgery

## 2023-04-06 DIAGNOSIS — E1142 Type 2 diabetes mellitus with diabetic polyneuropathy: Secondary | ICD-10-CM

## 2023-04-08 ENCOUNTER — Ambulatory Visit: Payer: Medicaid Other | Admitting: Orthopedic Surgery

## 2023-04-08 ENCOUNTER — Encounter: Payer: Self-pay | Admitting: Occupational Therapy

## 2023-04-08 ENCOUNTER — Ambulatory Visit: Payer: Medicaid Other | Attending: Orthopedic Surgery | Admitting: Occupational Therapy

## 2023-04-08 ENCOUNTER — Ambulatory Visit (INDEPENDENT_AMBULATORY_CARE_PROVIDER_SITE_OTHER): Payer: Self-pay

## 2023-04-08 DIAGNOSIS — R208 Other disturbances of skin sensation: Secondary | ICD-10-CM | POA: Diagnosis present

## 2023-04-08 DIAGNOSIS — G5601 Carpal tunnel syndrome, right upper limb: Secondary | ICD-10-CM

## 2023-04-08 DIAGNOSIS — R278 Other lack of coordination: Secondary | ICD-10-CM | POA: Diagnosis present

## 2023-04-08 DIAGNOSIS — M25541 Pain in joints of right hand: Secondary | ICD-10-CM | POA: Diagnosis present

## 2023-04-08 DIAGNOSIS — M25531 Pain in right wrist: Secondary | ICD-10-CM | POA: Diagnosis not present

## 2023-04-08 DIAGNOSIS — M6281 Muscle weakness (generalized): Secondary | ICD-10-CM | POA: Diagnosis present

## 2023-04-08 DIAGNOSIS — M1811 Unilateral primary osteoarthritis of first carpometacarpal joint, right hand: Secondary | ICD-10-CM

## 2023-04-08 DIAGNOSIS — R29898 Other symptoms and signs involving the musculoskeletal system: Secondary | ICD-10-CM | POA: Diagnosis present

## 2023-04-08 NOTE — Patient Instructions (Signed)
 WEARING SCHEDULE:  Wear splint at ALL times except for hygiene care (May remove splint for wrist exercise ONLY and then immediately place splint back on)  PURPOSE:  To prevent movement and for protection until injury can heal  CARE OF SPLINT:  Keep splint away from heat sources including: stove, radiator or furnace, or a car in sunlight. The splint can melt and will no longer fit you properly  Keep away from pets and children  Clean the splint with rubbing alcohol 1-2 times per day.  * During this time, make sure you also clean your hand/arm as instructed by your therapist and/or perform dressing changes as needed. Then dry hand/arm completely before replacing splint. (When cleaning hand/arm, keep thumb immobilized in same position until splint is replaced)  PRECAUTIONS/POTENTIAL PROBLEMS: *If you notice or experience increased pain, swelling, numbness, or a lingering reddened area from the splint: Contact your therapist immediately by calling [phone]. You must wear the splint for protection, but we will get you scheduled for adjustments as quickly as possible.  (If only straps or hooks need to be replaced and NO adjustments to the splint need to be made, just call the office ahead and let them know you are coming in)  If you have any medical concerns or signs of infection, please call your doctor immediately     WITH SPLINT ON FOR BELOW EXERCISES  SHOULDER: Flexion Unilateral    Raise right arm overhead. Keep thumb pointed up. _10__ reps per set, _4__ sets per day   Elbow Flexion / Extension    Bend Rt elbow up and down x 10 repetitions. Repeat 4 times per day  IP Flexion (Active Blocked)    Inside of splint -  Bend tip joint as far as possible. Repeat __10__ times. Do __4__ sessions per day.      TAKE SPLINT OFF for below exercise, keep thumb still:   AROM: Wrist Extension    With right palm down, bend wrist up. Repeat __10__ times per set. Do __4__ sessions  per day.

## 2023-04-08 NOTE — Progress Notes (Signed)
   Stacy [name] - 63 y.o. female MRN [id number]  Date of birth: 09-Jun-1960  Office Visit Note: Visit Date: 04/08/2023 PCP: Marcine Matar, MD Referred by: Marcine Matar, MD  Subjective:  HPI: Stacy [name] is a 63 y.o. female who presents today for follow up 2 weeks status post right thumb [facility] arthroplasty with open carpal tunnel release.  She is doing well overall, pain is controlled.  Numbness and tingling has improved significantly in the right hand.  Pertinent ROS were reviewed with the patient and found to be negative unless otherwise specified above in HPI.   Assessment & Plan: Visit Diagnoses:  1. Pain in right wrist   2. Arthritis of carpometacarpal (CMC) joint of right thumb   3. Carpal tunnel syndrome, right upper limb     Plan: She will be seen by occupational therapy today for fabrication of a thumb spica orthosis.  Continue immobilization at thumb [facility] for additional 2 weeks.  Begin range of motion per protocol at the 4-week juncture postop.  I am comfortable with beginning wrist range of motion at this point for nerve glides at the carpal tunnel region.  Discussed with occupational therapy who is seeing her later today.  I will plan on seeing her back in 4 weeks from today's visit to track her motion and progress.  Follow-up: No follow-ups on file.   Meds & Orders: No orders of the defined types were placed in this encounter.   Orders Placed This Encounter  Procedures   XR Wrist Complete Right     Procedures: No procedures performed       Objective:   Vital Signs: LMP 09/01/2010   Ortho Exam Right wrist: - Well-healed incision at the glabrous/nonglabrous juncture over the [facility] region of the thumb, sutures removed today - Well-healed palmar incision, sutures removed today - Thumb circumduction without significant pain or crepitus - Hand is warm well-perfused, sensation intact in all distributions including DRSN   Imaging: XR Wrist  Complete Right Result Date: 04/08/2023 X-rays of the right wrist, multiple views were performed today X-rays demonstrate stable appearance of the thumb metacarpal status post trapeziectomy.    Stacy [name], M.D.  [facility], Hand Surgery

## 2023-04-08 NOTE — Therapy (Signed)
 OUTPATIENT OCCUPATIONAL THERAPY ORTHO TREATMENT  Patient Name: Stacy [name] [facility] MRN: [id number] DOB:October 06, 1960, 63 y.o., female Today's Date: 04/08/2023  PCP: Marcine Matar, MD  REFERRING PROVIDER: Samuella Cota, MD   END OF SESSION:  OT End of Session - 04/08/23 1223     Visit Number 2    Number of Visits 14   including eval   Date for OT Re-Evaluation 05/14/23    Authorization Type [employer]    OT Start Time 1218    OT Stop Time 1325    OT Time Calculation (min) 67 min    Activity Tolerance Patient tolerated treatment well;Other (comment)   per current precautions, NWB RUE   Behavior During Therapy WFL for tasks assessed/performed              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   Asthma    COPD (chronic obstructive pulmonary disease) (HCC)    Depression    Diabetes mellitus    Type II   GERD (gastroesophageal reflux disease)    Pt on Protonix daily   Glaucoma    Gout    Headache(784.0)    otc meds prn   Hyperlipidemia    Hypertension    Irritable bowel syndrome 11/19/2010   Neuropathy    Pneumonia YRS AGO   Restless legs    Shortness of breath    07/14/2018- uses  4 times a day   Sleep apnea    Past Surgical History:  Procedure Laterality Date   BACK SURGERY     feb 21, 23   CARPAL TUNNEL RELEASE Right 03/26/2023   Procedure: RIGHT CARPAL TUNNEL RELEASE;  Surgeon: Samuella Cota, MD;  Location: [facility];  Service: Orthopedics;  Laterality: Right;   CARPOMETACARPEL SUSPENSION PLASTY Right 03/26/2023   Procedure: RIGHT THUMB CARPOMETACARPEL (CMC)  ARTHROPLASTY WITH INTERNAL BRACE;  Surgeon: Samuella Cota, MD;  Location: [facility];  Service: Orthopedics;  Laterality: Right;   CHOLECYSTECTOMY     COLONOSCOPY     ENDOMETRIAL ABLATION  10/2010   HERNIA REPAIR     umbicial  hernia   JOINT REPLACEMENT Left 03/14/2019   Dr. Magnus Ivan hip   KNEE ARTHROSCOPY Left    06/07/2017 Dr. August [facility]   KNEE ARTHROSCOPY Left 03/21/2020   Procedure: LEFT KNEE ARTHROSCOPY WITH PARTIAL MEDIAL MENISCECTOMY;  Surgeon: [name], MD;  Location: [facility];  Service: Orthopedics;  Laterality: Left;   KNEE CLOSED REDUCTION Right 12/06/2015   Procedure: CLOSED MANIPULATION RIGHT KNEE;  Surgeon: Kerrin Champagne, MD;  Location: [facility];  Service: Orthopedics;  Laterality: Right;   KNEE CLOSED REDUCTION Right 01/17/2016   Procedure: CLOSED MANIPULATION RIGHT KNEE;  Surgeon: Kerrin Champagne, MD;  Location: [facility];  Service: Orthopedics;  Laterality: Right;   KNEE JOINT MANIPULATION Right 12/06/2015   LACRIMAL TUBE INSERTION Bilateral 03/01/2019   Procedure: LACRIMAL TUBE INSERTION;  Surgeon: Aura Camps, MD;  Location: [facility];  Service: Ophthalmology;  Laterality: Bilateral;   LACRIMAL TUBE REMOVAL Bilateral 05/03/2019   Procedure: BILATERAL NASOLACRIMAL DUCT PROBING, IIRIGATION AND TUBE REMOVAL BOTH EYES;  Surgeon: Aura Camps, MD;  Location: [facility];  Service: Ophthalmology;  Laterality: Bilateral;   LUMBAR LAMINECTOMY/DECOMPRESSION MICRODISCECTOMY N/A 06/03/2015   Procedure: Bilateral lateral recess decompression L2-3,  L3-4, L4-5;  Surgeon: Kerrin Champagne, MD;  Location[facility];  Service: Orthopedics;  Laterality: N/A;   LUMBAR LAMINECTOMY/DECOMPRESSION MICRODISCECTOMY N/A 10/02/2016   Procedure: Right L5-S1 Lateral Recess Decompression  microdiscectomy;  Surgeon: Kerrin Champagne, MD;  Location: [facility] OR;  Service: Orthopedics;  Laterality: N/A;   LUMBAR LAMINECTOMY/DECOMPRESSION MICRODISCECTOMY N/A 07/15/2018   Procedure: LEFT L3-4 MICRODISCECTOMY;  Surgeon: Kerrin Champagne, MD;  Location: [facility] OR;  Service: Orthopedics;  Laterality: N/A;   svd      x 2   TEAR DUCT PROBING Bilateral 03/01/2019   Procedure: TEAR  DUCT PROBING WITH IRRIGATION;  Surgeon: Aura Camps, MD;  Location: [facility];  Service: Ophthalmology;  Laterality: Bilateral;   TOTAL HIP ARTHROPLASTY Left 03/14/2019   Procedure: LEFT TOTAL HIP ARTHROPLASTY ANTERIOR APPROACH;  Surgeon: [name], MD;  Location: [facility];  Service: Orthopedics;  Laterality: Left;   TOTAL KNEE ARTHROPLASTY Right 09/06/2015   Procedure: RIGHT TOTAL KNEE ARTHROPLASTY;  Surgeon: Kerrin Champagne, MD;  Location: [facility];  Service: Orthopedics;  Laterality: Right;   TOTAL KNEE REVISION Right 05/12/2022   Procedure: RIGHT TOTAL KNEE REVISION ARTHROPLASTY;  Surgeon: [name], MD;  Location: [facility];  Service: Orthopedics;  Laterality: Right;   TUBAL LIGATION     UPPER GASTROINTESTINAL ENDOSCOPY  04/28/2011   Patient Active Problem List   Diagnosis Date Noted   Arthritis of carpometacarpal [facility]) joint of right thumb 03/26/2023   Carpal tunnel syndrome, right upper limb 03/26/2023   Status post revision of total replacement of right knee 05/12/2022   History of colonic polyps 03/20/2022   Failed total knee, right, subsequent encounter 03/02/2022   Loose right total knee arthroplasty (HCC) 03/02/2022   Mixed stress and urge urinary incontinence 12/04/2021   Functional fecal incontinence 12/04/2021   IBS (irritable bowel syndrome) 11/19/2021   Spondylolisthesis, lumbar region    Other spondylosis with radiculopathy, lumbar region    Other secondary scoliosis, lumbar region    Fusion of spine of lumbar region 03/25/2021   Colon polyps 06/06/2020   Lupus 06/06/2020   Allergic rhinitis due to animal (cat) (dog) hair and dander 04/08/2020   Allergic rhinitis due to pollen 04/08/2020   Food allergy 04/08/2020   Acute medial meniscus tear, left, subsequent encounter 03/21/2020   Chronic pain of left knee 02/15/2020   Paresthesia of skin 11/16/2019   History of total knee replacement, right 11/16/2019   Tobacco abuse 11/16/2019    Centrilobular emphysema (HCC) 08/10/2019   Incidental lung nodule, > 3mm and < 8mm 08/10/2019   OSA on CPAP 08/10/2019   Dyspnea on exertion 08/02/2019   Hyperlipidemia 08/02/2019   Lumbar radiculopathy 04/24/2019   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   Post laminectomy syndrome 02/23/2019   Abnormality of gait 02/23/2019   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   Unilateral primary osteoarthritis, left hip 12/28/2018   Lesion of skin of left ear 12/26/2018   Primary osteoarthritis of left hip 12/02/2018   Iron deficiency anemia 10/16/2018   Chronic pain syndrome 09/08/2018   Chronic pain of right knee 08/11/2018   Status post lumbar laminectomy 07/15/2018   Peripheral arterial disease (HCC) 04/05/2018   Moderate persistent asthma without complication 06/29/2017   Environmental and seasonal allergies 06/29/2017   Controlled type 2 diabetes mellitus with diabetic polyneuropathy, without long-term current use of insulin (HCC) 06/29/2017   Perennial allergic rhinitis 04/08/2017   Sensorineural hearing loss (SNHL), bilateral 04/08/2017   Chronic  pansinusitis 03/25/2017   Eustachian tube dysfunction, bilateral 03/25/2017   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   Herniation of lumbar intervertebral disc with radiculopathy 10/02/2016    Class: Chronic   Alopecia areata 08/19/2016   Chondromalacia of both patellae 06/03/2015    Class: Chronic   Spinal stenosis, lumbar region, with neurogenic claudication 06/03/2015   Tobacco use disorder 04/25/2015   DJD (degenerative joint disease) of knee 01/04/2015   Hemorrhoid 11/14/2014   Gout of big toe 07/19/2014   Essential hypertension 08/14/2013   Gastroesophageal reflux disease without esophagitis 08/14/2013   COPD (chronic obstructive pulmonary disease) (HCC) 04/17/2011    ONSET DATE: 03/30/2023 (referral date), 03/26/23 (date of surgery, s/p R thumb CMC arthroplasty, R open carpal tunnel release)  REFERRING DIAG: M18.11 (ICD-10-CM) -  Arthritis of carpometacarpal (CMC) joint of right thumb  THERAPY DIAG:  Muscle weakness (generalized)  Other lack of coordination  Other disturbances of skin sensation  Pain in joint of right hand  Other symptoms and signs involving the musculoskeletal system  Rationale for Evaluation and Treatment: Rehabilitation  SUBJECTIVE:   SUBJECTIVE STATEMENT: Pt saw MD earlier this morning and he took the stitches out so pain is higher 7/10 currently  Pt accompanied by: self  PERTINENT HISTORY: 03/26/23 R thumb CMC arthroplasty and R open carpal tunnel release, s/p R TKA 2024, lupus, L hip total arthroplasty, tobacco abuse, hyperlipidemia, OSA on CPAP, essential HTN, COPD, GERD, HPV   03/30/23 Referral Notes:  Splint fabrication [facility] arthroplasty *Thursday  March 6th after 9 am  PRECAUTIONS: Per 5th Edition [place] [facility] protocol, Fall, Other:    Per 03/26/23 OP Note: The patient will be non weight bearing on the right upper extremity in a spica splint. I will see the patient back in the office in 2 weeks for postoperative followup.   RED FLAGS: Hx of lupus   WEIGHT BEARING RESTRICTIONS: Yes NWB R hand , per [facility] arthroplasty protocol   PAIN:  Are you having pain? Yes: NPRS scale: 7 out of 10 Pain location: both front and back and back of R thumb Pain description: sharp sometimes Aggravating factors: moving a certain way or sleeping with hand hanging down Relieving factors: rest, propping hand in bed  FALLS: Has patient fallen in last 6 months? Yes. Number of falls 1 L knee buckled on Friday last week at surgery center, pt reported no injuries, assistance from nurses  to get up  LIVING ENVIRONMENT: Lives with: lives with their spouse Lives in: House/apartment Stairs: Yes: Internal: 12 steps; none and External: 2 steps; on right going up and on left going up   PLOF: Independent with basic ADLs, transportation service for community mobility  PATIENT GOALS: "improvement and  mobility with the hand"  NEXT MD VISIT: 05/10/23  OBJECTIVE:  Note: Objective measures were completed at Evaluation unless otherwise noted.  HAND DOMINANCE: Left  ADLs: Eating: ind Grooming: denture care, some difficulty using one hand to replace/remove dentures Upper body dressing: ind with pullover shirt with extra time, some pain of affected UE Lower body dressing: ind with extra time, difficulty with zippers d/t using L  hand Toileting: ind Bathing: sponge bathing, difficulty d/t one hand, assistance from spouse to wash back Tub shower transfers: currently sponge bathing  FUNCTIONAL OUTCOME MEASURES: Quick Dash: 90.9% deficit     UPPER EXTREMITY ROM:     Active ROM Right eval Left eval  Shoulder flexion Unable to fully assess d/t presence of sling WFL  Shoulder abduction Unable to fully  assess d/t presence of sling   Shoulder adduction    Shoulder extension    Shoulder internal rotation    Shoulder external rotation    Elbow flexion WFL   Elbow extension WFL   Wrist flexion    Wrist extension    Wrist ulnar deviation    Wrist radial deviation    Wrist pronation    Wrist supination    (Blank rows = not tested)  04/02/23 - Unable to assess ROM of affected hand d/t recent surgery and current precautions and presence of bulky compressive dressing.  Active ROM Right eval Left eval  Thumb MCP (0-60)      WFL                           Thumb IP (0-80)    Thumb Radial abd/add (0-55)    Thumb Palmar abd/add (0-45)    Thumb Opposition to Small Finger    Index MCP (0-90)    Index PIP (0-100)    Index DIP (0-70)     Long MCP (0-90)     Long PIP (0-100)     Long DIP (0-70)     Ring MCP (0-90)     Ring PIP (0-100)     Ring DIP (0-70)     Little MCP (0-90)     Little PIP (0-100)     Little DIP (0-70)     (Blank rows = not tested)  HAND FUNCTION: Grip strength: Unable to assess secondary to current precautions  COORDINATION: Unable to assess  secondary to current precautions  SENSATION: Moderate tingling/numbness in B fingers  EDEMA: Mild in fingers, unable to assess full hand d/t presence of bulky compressive covering  COGNITION: Overall cognitive status: Within functional limits for tasks assessed  OBSERVATIONS: Pt was pleasant and appeared well-kept. Pt currently wearing sling of RUE with affected hand wrapped in Ace wrap as bulky compressive dressing.   TREATMENT DATE: 04/08/23                                                                                                                          RUE Wrist flex = 55*, ext = 45*  Thumb IP joint = 45* Shoulder, elbow, forearm and hand ROM WFL's  Fabricated and fitted thumb spica splint (forearm based) with thumb positioned b/t radial and palmer abduction (per protocol). Issued splint, reviewed wear and care of splint, and had pt practiced donning/doffing x 3 d/t apprehension.   Pt issued HEP for Rt shoulder flex, elbow flex/ext, hand ex's (tendon gliding), and thumb IP joint flex/ext all INSIDE of thumb spica splint.  Pt instructed to remove splint ONLY for gentle active wrist flex/ext while keeping thumb still, and for hygiene care.   Pt return demo of all exercises.   Pt instructed to d/c shoulder sling  Cleaned forearm and hand gently - pt had to be cued several times not to move thumb  as pt would automatically open thumb more in radial abduction when cleaning, or to assist with getting stockinette on/off. Pt instructed to keep thumb in same position as when in splint when she is cleaning/drying her hand, donning/doffing stockinette, and performing wrist ex's. Splint on at all other times including showering   Unable to do scar massage yet as pt has steri-strips  PATIENT EDUCATION: Education details: see above  Person educated: Patient and (soon to be) daughter-n-law Education method: Explanation, Demonstration, Verbal cues, and Handouts Education comprehension:  verbalized understanding, returned demonstration, verbal cues required, and needs further education  HOME EXERCISE PROGRAM: 04/08/23: splint wear and care, hygiene care, precautions, initial A/ROM HEP for RUE (NOT for CMC/MP joint of thumb)  GOALS: Goals reviewed with patient? Yes  SHORT TERM GOALS: Target date: 04/23/23  Pt will obtain protective, custom forearm based thumb spica splint/orthotic and ind recall splint wear/care schedule. Baseline: Pt currently has compressive bulky dressing on RUE with sling. Goal status: IN PROGRESS  2.  Pt will demo understanding of initial RUE HEP to improve pain levels and prerequisite motion for functional use of her R hand/thumb. Baseline: new to outpt OT Goal status: IN PROGRESS  3.  Pt will return demo of scar management techniques and edema management strategies. Baseline: new to outpt OT Goal status: INITIAL   LONG TERM GOALS: Target date: 05/14/23  Pt will be ind with updated RUE HEP Baseline: new to outpt OT Goal status: INITIAL  2.  Patient will demonstrate at least 16% improvement with quick Dash score (reporting 74.9% disability or less) indicating improved functional use of affected extremity.  Baseline: 90.9% deficit  Goal status: INITIAL  3.  Pt will improve grip strength in R hand from unable to assess to at least 15 lbs for functional use at home and in ADLs/IADLs per precautions and protocol. Baseline: unable to assess secondary to current precautions Goal status: INITIAL  4.  Pt will improve A/ROM in R thumb to PIP (or greater) of R small finger, to have functional motion for tasks like ADL's, reach and grasp  Baseline: unable to assess AROM secondary to current precautions and presence of compressive bulky dressing. Goal status: INITIAL  5.  Pt will improve R hand functional use to at least be Mod I for basic ADL's (ie, brushing her hair, bathing, brushing her teeth and folding laundry) to assist in ability to carry out  self-care and higher-level home-care tasks with less difficulty. Baseline: unable to assess, compensating with L hand for all tasks d/t NWB RUE Goal status: INITIAL  6. Patient will demo improved FM coordination as evidenced by completing nine-hole peg with use of RUE in 40 seconds or less.  Baseline: unable to assess secondary to current precautions and presence of bulky compressive dressing.  Goal Status: INITIAL  ASSESSMENT:  CLINICAL IMPRESSION: Pt now just under 2 weeks post-op. Patient was seen today for occupational therapy treatment including splinting and initiation of HEP for arthritis of carpometacarpal [facility]) joint of right thumb, s/p R thumb CMC arthroplasty, R open carpal tunnel release. Hx includes 03/26/23 R thumb CMC arthroplasty and R open carpal tunnel release, s/p R TKA 2024, lupus, L hip total arthroplasty, tobacco abuse, hyperlipidemia, OSA on CPAP, essential HTN, COPD, GERD, HPV. Patient currently presents below baseline level of functioning demonstrating functional deficits and impairments as noted below. Pt would benefit from skilled OT services in the outpatient setting to work on impairments as noted below to help pt return to [facility]  as able.     PERFORMANCE DEFICITS: in functional skills including ADLs, IADLs, coordination, dexterity, proprioception, sensation, edema, ROM, strength, pain, Fine motor control, Gross motor control, mobility, body mechanics, endurance, wound, skin integrity, and UE functional use, cognitive skills including  none , and psychosocial skills including environmental adaptation.   IMPAIRMENTS: are limiting patient from ADLs, IADLs, rest and sleep, leisure, and social participation.   COMORBIDITIES: may have co-morbidities  that affects occupational performance. Patient will benefit from skilled OT to address above impairments and improve overall function.  MODIFICATION OR ASSISTANCE TO COMPLETE EVALUATION: Min-Moderate modification of tasks or  assist with assess necessary to complete an evaluation.  OT OCCUPATIONAL PROFILE AND HISTORY: Detailed assessment: Review of records and additional review of physical, cognitive, psychosocial history related to current functional performance.  CLINICAL DECISION MAKING: Moderate - several treatment options, min-mod task modification necessary  REHAB POTENTIAL: Good  EVALUATION COMPLEXITY: Moderate      PLAN:  OT FREQUENCY: 1-2x/week (1x per week for first week to fabricate splint, 2x per week for 6 weeks for remainder of POC)   OT DURATION: 7 weeks + eval  PLANNED INTERVENTIONS: 97168 OT Re-evaluation, 97535 self care/ADL training, 16109 therapeutic exercise, 97530 therapeutic activity, 97112 neuromuscular re-education, 97140 manual therapy, 97035 ultrasound, 97018 paraffin, 60454 fluidotherapy, 97010 moist heat, 97010 cryotherapy, 97760 Orthotics management and training, 09811 Splinting (initial encounter), [id number] Subsequent splinting/medication, passive range of motion, functional mobility training, energy conservation, patient/family education, and DME and/or AE instructions  RECOMMENDED OTHER SERVICES: N/A  CONSULTED AND AGREED WITH PLAN OF CARE: Patient  PLAN FOR NEXT SESSION:  Splint adjustments prn, review HEP, progress per protocol     For all possible CPT codes, reference the Planned Interventions line above.     Check all conditions that are expected to impact treatment: {Conditions expected to impact treatment:Musculoskeletal disorders and Active major medical illness  (Lupus)  If treatment provided at initial evaluation, no treatment charged due to lack of authorization.        Sheran Lawless, OT 04/08/2023, 1:36 PM

## 2023-04-12 ENCOUNTER — Ambulatory Visit: Payer: Medicaid Other | Admitting: Occupational Therapy

## 2023-04-12 DIAGNOSIS — M6281 Muscle weakness (generalized): Secondary | ICD-10-CM | POA: Diagnosis not present

## 2023-04-12 DIAGNOSIS — M25541 Pain in joints of right hand: Secondary | ICD-10-CM

## 2023-04-12 DIAGNOSIS — R29898 Other symptoms and signs involving the musculoskeletal system: Secondary | ICD-10-CM

## 2023-04-12 DIAGNOSIS — R278 Other lack of coordination: Secondary | ICD-10-CM

## 2023-04-12 DIAGNOSIS — R208 Other disturbances of skin sensation: Secondary | ICD-10-CM

## 2023-04-12 NOTE — Therapy (Signed)
 OUTPATIENT OCCUPATIONAL THERAPY ORTHO TREATMENT  Patient Name: Stacy [name] MRN: [id number] DOB:1960/10/09, 63 y.o., female Today's Date: 04/12/2023  PCP: Marcine Matar, MD  REFERRING PROVIDER: Samuella Cota, MD   END OF SESSION:  OT End of Session - 04/12/23 1048     Visit Number 3    Number of Visits 14   including eval   Date for OT Re-Evaluation 05/14/23    Authorization Type [employer]    OT Start Time 1000    OT Stop Time 1045    OT Time Calculation (min) 45 min    Activity Tolerance Patient tolerated treatment well;Other (comment)   per current precautions, NWB RUE   Behavior During Therapy WFL for tasks assessed/performed               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   Asthma    COPD (chronic obstructive pulmonary disease) (HCC)    Depression    Diabetes mellitus    Type II   GERD (gastroesophageal reflux disease)    Pt on Protonix daily   Glaucoma    Gout    Headache(784.0)    otc meds prn   Hyperlipidemia    Hypertension    Irritable bowel syndrome 11/19/2010   Neuropathy    Pneumonia YRS AGO   Restless legs    Shortness of breath    07/14/2018- uses  4 times a day   Sleep apnea    Past Surgical History:  Procedure Laterality Date   BACK SURGERY     feb 21, 23   CARPAL TUNNEL RELEASE Right 03/26/2023   Procedure: RIGHT CARPAL TUNNEL RELEASE;  Surgeon: Samuella Cota, MD;  Location: [facility];  Service: Orthopedics;  Laterality: Right;   CARPOMETACARPEL SUSPENSION PLASTY Right 03/26/2023   Procedure: RIGHT THUMB CARPOMETACARPEL (CMC)  ARTHROPLASTY WITH INTERNAL BRACE;  Surgeon: Samuella Cota, MD;  Location: [facility];  Service: Orthopedics;  Laterality: Right;   CHOLECYSTECTOMY     COLONOSCOPY     ENDOMETRIAL ABLATION  10/2010   HERNIA REPAIR     umbicial  hernia   JOINT REPLACEMENT Left 03/14/2019   Dr. Magnus Ivan hip   KNEE ARTHROSCOPY Left    06/07/2017 Dr. August [facility]   KNEE ARTHROSCOPY Left 03/21/2020   Procedure: LEFT KNEE ARTHROSCOPY WITH PARTIAL MEDIAL MENISCECTOMY;  Surgeon: [name], MD;  Location: [facility];  Service: Orthopedics;  Laterality: Left;   KNEE CLOSED REDUCTION Right 12/06/2015   Procedure: CLOSED MANIPULATION RIGHT KNEE;  Surgeon: Kerrin Champagne, MD;  Location: [facility];  Service: Orthopedics;  Laterality: Right;   KNEE CLOSED REDUCTION Right 01/17/2016   Procedure: CLOSED MANIPULATION RIGHT KNEE;  Surgeon: Kerrin Champagne, MD;  Location: [facility];  Service: Orthopedics;  Laterality: Right;   KNEE JOINT MANIPULATION Right 12/06/2015   LACRIMAL TUBE INSERTION Bilateral 03/01/2019   Procedure: LACRIMAL TUBE INSERTION;  Surgeon: Aura Camps, MD;  Location: [facility];  Service: Ophthalmology;  Laterality: Bilateral;   LACRIMAL TUBE REMOVAL Bilateral 05/03/2019   Procedure: BILATERAL NASOLACRIMAL DUCT PROBING, IIRIGATION AND TUBE REMOVAL BOTH EYES;  Surgeon: Aura Camps, MD;  Location: [facility];  Service: Ophthalmology;  Laterality: Bilateral;   LUMBAR LAMINECTOMY/DECOMPRESSION MICRODISCECTOMY N/A 06/03/2015   Procedure: Bilateral lateral recess decompression  L2-3, L3-4, L4-5;  Surgeon: Kerrin Champagne, MD;  Location: [facility];  Service: Orthopedics;  Laterality: N/A;   LUMBAR LAMINECTOMY/DECOMPRESSION MICRODISCECTOMY N/A 10/02/2016   Procedure: Right L5-S1 Lateral Recess Decompression  microdiscectomy;  Surgeon: Kerrin Champagne, MD;  [facility];  Service: Orthopedics;  Laterality: N/A;   LUMBAR LAMINECTOMY/DECOMPRESSION MICRODISCECTOMY N/A 07/15/2018   Procedure: LEFT L3-4 MICRODISCECTOMY;  Surgeon: Kerrin Champagne, MD;  Location: [facility] OR;  Service: Orthopedics;  Laterality: N/A;   svd      x 2   TEAR DUCT PROBING Bilateral 03/01/2019   Procedure: TEAR  DUCT PROBING WITH IRRIGATION;  Surgeon: Aura Camps, MD;  Location: [facility];  Service: Ophthalmology;  Laterality: Bilateral;   TOTAL HIP ARTHROPLASTY Left 03/14/2019   Procedure: LEFT TOTAL HIP ARTHROPLASTY ANTERIOR APPROACH;  Surgeon: [name], MD;  Location: [facility];  Service: Orthopedics;  Laterality: Left;   TOTAL KNEE ARTHROPLASTY Right 09/06/2015   Procedure: RIGHT TOTAL KNEE ARTHROPLASTY;  Surgeon: Kerrin Champagne, MD;  Location: [facility];  Service: Orthopedics;  Laterality: Right;   TOTAL KNEE REVISION Right 05/12/2022   Procedure: RIGHT TOTAL KNEE REVISION ARTHROPLASTY;  Surgeon: [name], MD;  Location: [facility];  Service: Orthopedics;  Laterality: Right;   TUBAL LIGATION     UPPER GASTROINTESTINAL ENDOSCOPY  04/28/2011   Patient Active Problem List   Diagnosis Date Noted   Arthritis of carpometacarpal [facility]) joint of right thumb 03/26/2023   Carpal tunnel syndrome, right upper limb 03/26/2023   Status post revision of total replacement of right knee 05/12/2022   History of colonic polyps 03/20/2022   Failed total knee, right, subsequent encounter 03/02/2022   Loose right total knee arthroplasty (HCC) 03/02/2022   Mixed stress and urge urinary incontinence 12/04/2021   Functional fecal incontinence 12/04/2021   IBS (irritable bowel syndrome) 11/19/2021   Spondylolisthesis, lumbar region    Other spondylosis with radiculopathy, lumbar region    Other secondary scoliosis, lumbar region    Fusion of spine of lumbar region 03/25/2021   Colon polyps 06/06/2020   Lupus 06/06/2020   Allergic rhinitis due to animal (cat) (dog) hair and dander 04/08/2020   Allergic rhinitis due to pollen 04/08/2020   Food allergy 04/08/2020   Acute medial meniscus tear, left, subsequent encounter 03/21/2020   Chronic pain of left knee 02/15/2020   Paresthesia of skin 11/16/2019   History of total knee replacement, right 11/16/2019   Tobacco abuse 11/16/2019    Centrilobular emphysema (HCC) 08/10/2019   Incidental lung nodule, > 3mm and < 8mm 08/10/2019   OSA on CPAP 08/10/2019   Dyspnea on exertion 08/02/2019   Hyperlipidemia 08/02/2019   Lumbar radiculopathy 04/24/2019   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   Post laminectomy syndrome 02/23/2019   Abnormality of gait 02/23/2019   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   Unilateral primary osteoarthritis, left hip 12/28/2018   Lesion of skin of left ear 12/26/2018   Primary osteoarthritis of left hip 12/02/2018   Iron deficiency anemia 10/16/2018   Chronic pain syndrome 09/08/2018   Chronic pain of right knee 08/11/2018   Status post lumbar laminectomy 07/15/2018   Peripheral arterial disease (HCC) 04/05/2018   Moderate persistent asthma without complication 06/29/2017   Environmental and seasonal allergies 06/29/2017   Controlled type 2 diabetes mellitus with diabetic polyneuropathy, without long-term current use of insulin (HCC) 06/29/2017   Perennial allergic rhinitis 04/08/2017   Sensorineural hearing loss (SNHL), bilateral 04/08/2017  Chronic pansinusitis 03/25/2017   Eustachian tube dysfunction, bilateral 03/25/2017   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   Herniation of lumbar intervertebral disc with radiculopathy 10/02/2016    Class: Chronic   Alopecia areata 08/19/2016   Chondromalacia of both patellae 06/03/2015    Class: Chronic   Spinal stenosis, lumbar region, with neurogenic claudication 06/03/2015   Tobacco use disorder 04/25/2015   DJD (degenerative joint disease) of knee 01/04/2015   Hemorrhoid 11/14/2014   Gout of big toe 07/19/2014   Essential hypertension 08/14/2013   Gastroesophageal reflux disease without esophagitis 08/14/2013   COPD (chronic obstructive pulmonary disease) (HCC) 04/17/2011    ONSET DATE: 03/30/2023 (referral date), 03/26/23 (date of surgery, s/p R thumb CMC arthroplasty, R open carpal tunnel release)  REFERRING DIAG: M18.11 (ICD-10-CM) -  Arthritis of carpometacarpal (CMC) joint of right thumb  THERAPY DIAG:  Muscle weakness (generalized)  Other lack of coordination  Other disturbances of skin sensation  Pain in joint of right hand  Other symptoms and signs involving the musculoskeletal system  Rationale for Evaluation and Treatment: Rehabilitation  SUBJECTIVE:   SUBJECTIVE STATEMENT: Pt reported wearing splint going well and exercises going well.  Pt accompanied by: self  PERTINENT HISTORY: 03/26/23 R thumb CMC arthroplasty and R open carpal tunnel release, s/p R TKA 2024, lupus, L hip total arthroplasty, tobacco abuse, hyperlipidemia, OSA on CPAP, essential HTN, COPD, GERD, HPV   03/30/23 Referral Notes:  Splint fabrication S/p [facility] arthroplasty *Thursday  March 6th after 9 am  PRECAUTIONS: Per 5th Edition [place] [facility] protocol, Fall, Other:    Per 03/26/23 OP Note: The patient will be non weight bearing on the right upper extremity in a spica splint. I will see the patient back in the office in 2 weeks for postoperative followup.   RED FLAGS: Hx of lupus   WEIGHT BEARING RESTRICTIONS: Yes NWB R hand , per [facility] arthroplasty protocol   PAIN:  Are you having pain? Yes: NPRS scale: 4 out of 10 Pain location: both front and back and back of R thumb Pain description: sharp sometimes Aggravating factors: moving a certain way or sleeping with hand hanging down Relieving factors: rest, propping hand in bed  FALLS: Has patient fallen in last 6 months? Yes. Number of falls 1 L knee buckled on Friday last week at surgery center, pt reported no injuries, assistance from nurses  to get up  LIVING ENVIRONMENT: Lives with: lives with their spouse Lives in: House/apartment Stairs: Yes: Internal: 12 steps; none and External: 2 steps; on right going up and on left going up   PLOF: Independent with basic ADLs, transportation service for community mobility  PATIENT GOALS: "improvement and mobility with the hand"  NEXT  MD VISIT: 05/10/23  OBJECTIVE:  Note: Objective measures were completed at Evaluation unless otherwise noted.  HAND DOMINANCE: Left  ADLs: Eating: ind Grooming: denture care, some difficulty using one hand to replace/remove dentures Upper body dressing: ind with pullover shirt with extra time, some pain of affected UE Lower body dressing: ind with extra time, difficulty with zippers d/t using L  hand Toileting: ind Bathing: sponge bathing, difficulty d/t one hand, assistance from spouse to wash back Tub shower transfers: currently sponge bathing  FUNCTIONAL OUTCOME MEASURES: Quick Dash: 90.9% deficit     UPPER EXTREMITY ROM:     Active ROM Right eval Left eval  Shoulder flexion Unable to fully assess d/t presence of sling WFL  Shoulder abduction Unable to fully assess d/t presence of sling  Shoulder adduction    Shoulder extension    Shoulder internal rotation    Shoulder external rotation    Elbow flexion WFL   Elbow extension WFL   Wrist flexion    Wrist extension    Wrist ulnar deviation    Wrist radial deviation    Wrist pronation    Wrist supination    (Blank rows = not tested)  04/02/23 - Unable to assess ROM of affected hand d/t recent surgery and current precautions and presence of bulky compressive dressing.  Active ROM Right eval Left eval  Thumb MCP (0-60)      WFL                           Thumb IP (0-80)    Thumb Radial abd/add (0-55)    Thumb Palmar abd/add (0-45)    Thumb Opposition to Small Finger    Index MCP (0-90)    Index PIP (0-100)    Index DIP (0-70)     Long MCP (0-90)     Long PIP (0-100)     Long DIP (0-70)     Ring MCP (0-90)     Ring PIP (0-100)     Ring DIP (0-70)     Little MCP (0-90)     Little PIP (0-100)     Little DIP (0-70)     (Blank rows = not tested)  RUE 04/08/23 Wrist flex = 55*, ext = 45*  Thumb IP joint = 45* Shoulder, elbow, forearm and hand ROM WFL's  HAND FUNCTION: Grip strength: Unable to  assess secondary to current precautions  COORDINATION: Unable to assess secondary to current precautions  SENSATION: Moderate tingling/numbness in B fingers  EDEMA: Mild in fingers, unable to assess full hand d/t presence of bulky compressive covering  COGNITION: Overall cognitive status: Within functional limits for tasks assessed  OBSERVATIONS: Pt was pleasant and appeared well-kept. Pt currently wearing sling of RUE with affected hand wrapped in Ace wrap as bulky compressive dressing.   TREATMENT DATE:                                                                                                                        04/12/23 Self-Care Pt confirmed splint is comfortable. Pt ind donned/doffed splint.  OT educated pt on splint and stockinette wear/care. Pt acknowledged understanding. Pt returned demo to clean splint with alcohol prep pad though required v/c and education to avoid using R thumb to support splint during cleaning secondary to NWB precautions of RUE. Pt then returned demo while attending to precautions. OT provided pt with additional stockinette.   OT assessed incision sites and noted no signs of infection though steri-strips still present. Unable to do scar massage yet as pt has steri-strips.  TherEx Review of HEP - to improve carryover, to improve affected UE AROM. Per pt request, OT provided pt with additional copy of exercises. Pt benefited from v/c and  upright mirror for visual feedback to keep wrist neutral for elbow flex/ext d/t tendency for ulnar deviation. OT educated pt on purpose of therapeutic tasks. Pt acknowledged understanding. OT educated pt on in-splint vs out-of-splint exercises to protect thumb CMC per precautions. Pt returned demo. Pt benefited from additional v/c and therapist modeling to improve understanding of tendon glides, especially "bear claw"/"hook" position.    PATIENT EDUCATION: Education details: see above  Person educated: Patient and  (soon to be) daughter-n-law Education method: Explanation, Demonstration, Verbal cues, and Handouts Education comprehension: verbalized understanding, returned demonstration, verbal cues required, and needs further education  HOME EXERCISE PROGRAM: 04/08/23: splint wear and care, hygiene care, precautions, initial A/ROM HEP for RUE (NOT for CMC/MP joint of thumb)  GOALS: Goals reviewed with patient? Yes  SHORT TERM GOALS: Target date: 04/23/23  Pt will obtain protective, custom forearm based thumb spica splint/orthotic and ind recall splint wear/care schedule. Baseline: Pt currently has compressive bulky dressing on RUE with sling. Goal status: IN PROGRESS  2.  Pt will demo understanding of initial RUE HEP to improve pain levels and prerequisite motion for functional use of her R hand/thumb. Baseline: new to outpt OT Goal status: IN PROGRESS  3.  Pt will return demo of scar management techniques and edema management strategies. Baseline: new to outpt OT Goal status: INITIAL   LONG TERM GOALS: Target date: 05/14/23  Pt will be ind with updated RUE HEP Baseline: new to outpt OT Goal status: INITIAL  2.  Patient will demonstrate at least 16% improvement with quick Dash score (reporting 74.9% disability or less) indicating improved functional use of affected extremity.  Baseline: 90.9% deficit  Goal status: INITIAL  3.  Pt will improve grip strength in R hand from unable to assess to at least 15 lbs for functional use at home and in ADLs/IADLs per precautions and protocol. Baseline: unable to assess secondary to current precautions Goal status: INITIAL  4.  Pt will improve A/ROM in R thumb to PIP (or greater) of R small finger, to have functional motion for tasks like ADL's, reach and grasp  Baseline: unable to assess AROM secondary to current precautions and presence of compressive bulky dressing. Goal status: INITIAL  5.  Pt will improve R hand functional use to at least be Mod I  for basic ADL's (ie, brushing her hair, bathing, brushing her teeth and folding laundry) to assist in ability to carry out self-care and higher-level home-care tasks with less difficulty. Baseline: unable to assess, compensating with L hand for all tasks d/t NWB RUE Goal status: INITIAL  6. Patient will demo improved FM coordination as evidenced by completing nine-hole peg with use of RUE in 40 seconds or less.  Baseline: unable to assess secondary to current precautions and presence of bulky compressive dressing.  Goal Status: INITIAL  ASSESSMENT:  CLINICAL IMPRESSION: Pt benefited from additional education today to improve carryover of HEP and to improve understanding of precautions. By end of session, pt demo'd improved understanding of HEP and precautions. Pt would benefit from skilled OT services in the outpatient setting to work on impairments as noted below to help pt return to PLOF as able.     PERFORMANCE DEFICITS: in functional skills including ADLs, IADLs, coordination, dexterity, proprioception, sensation, edema, ROM, strength, pain, Fine motor control, Gross motor control, mobility, body mechanics, endurance, wound, skin integrity, and UE functional use, cognitive skills including  none , and psychosocial skills including environmental adaptation.   IMPAIRMENTS: are limiting patient from  ADLs, IADLs, rest and sleep, leisure, and social participation.   COMORBIDITIES: may have co-morbidities  that affects occupational performance. Patient will benefit from skilled OT to address above impairments and improve overall function.  MODIFICATION OR ASSISTANCE TO COMPLETE EVALUATION: Min-Moderate modification of tasks or assist with assess necessary to complete an evaluation.  OT OCCUPATIONAL PROFILE AND HISTORY: Detailed assessment: Review of records and additional review of physical, cognitive, psychosocial history related to current functional performance.  CLINICAL DECISION MAKING:  Moderate - several treatment options, min-mod task modification necessary  REHAB POTENTIAL: Good  EVALUATION COMPLEXITY: Moderate      PLAN:  OT FREQUENCY: 1-2x/week (1x per week for first week to fabricate splint, 2x per week for 6 weeks for remainder of POC)   OT DURATION: 7 weeks + eval  PLANNED INTERVENTIONS: 97168 OT Re-evaluation, 97535 self care/ADL training, 16109 therapeutic exercise, 97530 therapeutic activity, 97112 neuromuscular re-education, 97140 manual therapy, 97035 ultrasound, 97018 paraffin, 60454 fluidotherapy, 97010 moist heat, 97010 cryotherapy, 97760 Orthotics management and training, 09811 Splinting (initial encounter), [id number] Subsequent splinting/medication, passive range of motion, functional mobility training, energy conservation, patient/family education, and DME and/or AE instructions  RECOMMENDED OTHER SERVICES: N/A  CONSULTED AND AGREED WITH PLAN OF CARE: Patient  PLAN FOR NEXT SESSION:  Splint adjustments prn, review HEP prn, progress per protocol - scar massage pending presence of steri strips, continue AROM    For all possible CPT codes, reference the Planned Interventions line above.     Check all conditions that are expected to impact treatment: {Conditions expected to impact treatment:Musculoskeletal disorders and Active major medical illness  (Lupus)  If treatment provided at initial evaluation, no treatment charged due to lack of authorization.        [name], OT 04/12/2023, 10:55 AM

## 2023-04-14 ENCOUNTER — Encounter: Payer: Self-pay | Admitting: Occupational Therapy

## 2023-04-14 ENCOUNTER — Ambulatory Visit: Admitting: Occupational Therapy

## 2023-04-14 DIAGNOSIS — M25541 Pain in joints of right hand: Secondary | ICD-10-CM

## 2023-04-14 DIAGNOSIS — R278 Other lack of coordination: Secondary | ICD-10-CM

## 2023-04-14 DIAGNOSIS — M6281 Muscle weakness (generalized): Secondary | ICD-10-CM

## 2023-04-14 DIAGNOSIS — R29898 Other symptoms and signs involving the musculoskeletal system: Secondary | ICD-10-CM

## 2023-04-14 DIAGNOSIS — R208 Other disturbances of skin sensation: Secondary | ICD-10-CM

## 2023-04-14 NOTE — Therapy (Signed)
 OUTPATIENT OCCUPATIONAL THERAPY ORTHO TREATMENT  Patient Name: Stacy [name] [facility] MRN: [id number] DOB:Jul 16, 1960, 63 y.o., female Today's Date: 04/14/2023  PCP: Marcine Matar, MD  REFERRING PROVIDER: Samuella Cota, MD   END OF SESSION:  OT End of Session - 04/14/23 1244     Visit Number 4    Number of Visits 14   including eval   Date for OT Re-Evaluation 05/14/23    Authorization Type [employer]    OT Start Time 1230    OT Stop Time 1315    OT Time Calculation (min) 45 min    Activity Tolerance Patient tolerated treatment well;Other (comment)   per current precautions, NWB RUE   Behavior During Therapy WFL for tasks assessed/performed               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   Asthma    COPD (chronic obstructive pulmonary disease) (HCC)    Depression    Diabetes mellitus    Type II   GERD (gastroesophageal reflux disease)    Pt on Protonix daily   Glaucoma    Gout    Headache(784.0)    otc meds prn   Hyperlipidemia    Hypertension    Irritable bowel syndrome 11/19/2010   Neuropathy    Pneumonia YRS AGO   Restless legs    Shortness of breath    07/14/2018- uses  4 times a day   Sleep apnea    Past Surgical History:  Procedure Laterality Date   BACK SURGERY     feb 21, 23   CARPAL TUNNEL RELEASE Right 03/26/2023   Procedure: RIGHT CARPAL TUNNEL RELEASE;  Surgeon: Samuella Cota, MD;  Location: [facility];  Service: Orthopedics;  Laterality: Right;   CARPOMETACARPEL SUSPENSION PLASTY Right 03/26/2023   Procedure: RIGHT THUMB CARPOMETACARPEL (CMC)  ARTHROPLASTY WITH INTERNAL BRACE;  Surgeon: Samuella Cota, MD;  Location: [facility];  Service: Orthopedics;  Laterality: Right;   CHOLECYSTECTOMY     COLONOSCOPY     ENDOMETRIAL ABLATION  10/2010   HERNIA REPAIR     umbicial  hernia   JOINT REPLACEMENT Left 03/14/2019   Dr. Magnus Ivan hip   KNEE ARTHROSCOPY Left    06/07/2017 Dr. August [facility]   KNEE ARTHROSCOPY Left 03/21/2020   Procedure: LEFT KNEE ARTHROSCOPY WITH PARTIAL MEDIAL MENISCECTOMY;  Surgeon: [name], MD;  Location: [facility];  Service: Orthopedics;  Laterality: Left;   KNEE CLOSED REDUCTION Right 12/06/2015   Procedure: CLOSED MANIPULATION RIGHT KNEE;  Surgeon: Kerrin Champagne, MD;  Location: [facility];  Service: Orthopedics;  Laterality: Right;   KNEE CLOSED REDUCTION Right 01/17/2016   Procedure: CLOSED MANIPULATION RIGHT KNEE;  Surgeon: Kerrin Champagne, MD;  Location: [facility];  Service: Orthopedics;  Laterality: Right;   KNEE JOINT MANIPULATION Right 12/06/2015   LACRIMAL TUBE INSERTION Bilateral 03/01/2019   Procedure: LACRIMAL TUBE INSERTION;  Surgeon: Aura Camps, MD;  Location: [facility];  Service: Ophthalmology;  Laterality: Bilateral;   LACRIMAL TUBE REMOVAL Bilateral 05/03/2019   Procedure: BILATERAL NASOLACRIMAL DUCT PROBING, IIRIGATION AND TUBE REMOVAL BOTH EYES;  Surgeon: Aura Camps, MD;  Location: [facility];  Service: Ophthalmology;  Laterality: Bilateral;   LUMBAR LAMINECTOMY/DECOMPRESSION MICRODISCECTOMY N/A 06/03/2015   Procedure: Bilateral lateral recess decompression  L2-3, L3-4, L4-5;  Surgeon: Kerrin Champagne, MD;  Location: [facility];  Service: Orthopedics;  Laterality: N/A;   LUMBAR LAMINECTOMY/DECOMPRESSION MICRODISCECTOMY N/A 10/02/2016   Procedure: Right L5-S1 Lateral Recess Decompression  microdiscectomy;  Surgeon: Kerrin Champagne, MD;  Location: [facility] OR;  Service: Orthopedics;  Laterality: N/A;   LUMBAR LAMINECTOMY/DECOMPRESSION MICRODISCECTOMY N/A 07/15/2018   Procedure: LEFT L3-4 MICRODISCECTOMY;  Surgeon: Kerrin Champagne, MD;  Location: [facility] OR;  Service: Orthopedics;  Laterality: N/A;   svd      x 2   TEAR DUCT PROBING Bilateral 03/01/2019   Procedure: TEAR  DUCT PROBING WITH IRRIGATION;  Surgeon: Aura Camps, MD;  Location: [facility];  Service: Ophthalmology;  Laterality: Bilateral;   TOTAL HIP ARTHROPLASTY Left 03/14/2019   Procedure: LEFT TOTAL HIP ARTHROPLASTY ANTERIOR APPROACH;  Surgeon: [name], MD;  Location: [facility];  Service: Orthopedics;  Laterality: Left;   TOTAL KNEE ARTHROPLASTY Right 09/06/2015   Procedure: RIGHT TOTAL KNEE ARTHROPLASTY;  Surgeon: Kerrin Champagne, MD;  Location: [facility];  Service: Orthopedics;  Laterality: Right;   TOTAL KNEE REVISION Right 05/12/2022   Procedure: RIGHT TOTAL KNEE REVISION ARTHROPLASTY;  Surgeon: [name], MD;  Location: [facility];  Service: Orthopedics;  Laterality: Right;   TUBAL LIGATION     UPPER GASTROINTESTINAL ENDOSCOPY  04/28/2011   Patient Active Problem List   Diagnosis Date Noted   Arthritis of carpometacarpal [facility]) joint of right thumb 03/26/2023   Carpal tunnel syndrome, right upper limb 03/26/2023   Status post revision of total replacement of right knee 05/12/2022   History of colonic polyps 03/20/2022   Failed total knee, right, subsequent encounter 03/02/2022   Loose right total knee arthroplasty (HCC) 03/02/2022   Mixed stress and urge urinary incontinence 12/04/2021   Functional fecal incontinence 12/04/2021   IBS (irritable bowel syndrome) 11/19/2021   Spondylolisthesis, lumbar region    Other spondylosis with radiculopathy, lumbar region    Other secondary scoliosis, lumbar region    Fusion of spine of lumbar region 03/25/2021   Colon polyps 06/06/2020   Lupus 06/06/2020   Allergic rhinitis due to animal (cat) (dog) hair and dander 04/08/2020   Allergic rhinitis due to pollen 04/08/2020   Food allergy 04/08/2020   Acute medial meniscus tear, left, subsequent encounter 03/21/2020   Chronic pain of left knee 02/15/2020   Paresthesia of skin 11/16/2019   History of total knee replacement, right 11/16/2019   Tobacco abuse 11/16/2019    Centrilobular emphysema (HCC) 08/10/2019   Incidental lung nodule, > 3mm and < 8mm 08/10/2019   OSA on CPAP 08/10/2019   Dyspnea on exertion 08/02/2019   Hyperlipidemia 08/02/2019   Lumbar radiculopathy 04/24/2019   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   Post laminectomy syndrome 02/23/2019   Abnormality of gait 02/23/2019   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   Unilateral primary osteoarthritis, left hip 12/28/2018   Lesion of skin of left ear 12/26/2018   Primary osteoarthritis of left hip 12/02/2018   Iron deficiency anemia 10/16/2018   Chronic pain syndrome 09/08/2018   Chronic pain of right knee 08/11/2018   Status post lumbar laminectomy 07/15/2018   Peripheral arterial disease (HCC) 04/05/2018   Moderate persistent asthma without complication 06/29/2017   Environmental and seasonal allergies 06/29/2017   Controlled type 2 diabetes mellitus with diabetic polyneuropathy, without long-term current use of insulin (HCC) 06/29/2017   Perennial allergic rhinitis 04/08/2017   Sensorineural hearing loss (SNHL), bilateral 04/08/2017  Chronic pansinusitis 03/25/2017   Eustachian tube dysfunction, bilateral 03/25/2017   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   Herniation of lumbar intervertebral disc with radiculopathy 10/02/2016    Class: Chronic   Alopecia areata 08/19/2016   Chondromalacia of both patellae 06/03/2015    Class: Chronic   Spinal stenosis, lumbar region, with neurogenic claudication 06/03/2015   Tobacco use disorder 04/25/2015   DJD (degenerative joint disease) of knee 01/04/2015   Hemorrhoid 11/14/2014   Gout of big toe 07/19/2014   Essential hypertension 08/14/2013   Gastroesophageal reflux disease without esophagitis 08/14/2013   COPD (chronic obstructive pulmonary disease) (HCC) 04/17/2011    ONSET DATE: 03/30/2023 (referral date), 03/26/23 (date of surgery, s/p R thumb CMC arthroplasty, R open carpal tunnel release)  REFERRING DIAG: M18.11 (ICD-10-CM) -  Arthritis of carpometacarpal (CMC) joint of right thumb  THERAPY DIAG:  Muscle weakness (generalized)  Other lack of coordination  Other disturbances of skin sensation  Pain in joint of right hand  Other symptoms and signs involving the musculoskeletal system  Rationale for Evaluation and Treatment: Rehabilitation  SUBJECTIVE:   SUBJECTIVE STATEMENT: Pain 6/10 at CTR incision - mostly dull but some sharp  Pt accompanied by: self  PERTINENT HISTORY: 03/26/23 R thumb CMC arthroplasty and R open carpal tunnel release, s/p R TKA 2024, lupus, L hip total arthroplasty, tobacco abuse, hyperlipidemia, OSA on CPAP, essential HTN, COPD, GERD, HPV   03/30/23 Referral Notes:  Splint fabrication S/p [facility] arthroplasty *Thursday  March 6th after 9 am  PRECAUTIONS: Per 5th Edition [place] Hand protocol, Fall, Other:    Per 03/26/23 OP Note: The patient will be non weight bearing on the right upper extremity in a spica splint. I will see the patient back in the office in 2 weeks for postoperative followup.   RED FLAGS: Hx of lupus   WEIGHT BEARING RESTRICTIONS: Yes NWB R hand , per [facility] arthroplasty protocol   PAIN:  Are you having pain? Yes: NPRS scale: 6 out of 10 Pain location: CTR incision Pain description: sharp sometimes Aggravating factors: moving a certain way or sleeping with hand hanging down Relieving factors: rest, propping hand in bed  FALLS: Has patient fallen in last 6 months? Yes. Number of falls 1 L knee buckled on Friday last week at surgery center, pt reported no injuries, assistance from nurses  to get up  LIVING ENVIRONMENT: Lives with: lives with their spouse Lives in: House/apartment Stairs: Yes: Internal: 12 steps; none and External: 2 steps; on right going up and on left going up   PLOF: Independent with basic ADLs, transportation service for community mobility  PATIENT GOALS: "improvement and mobility with the hand"  NEXT MD VISIT: 05/10/23  OBJECTIVE:   Note: Objective measures were completed at Evaluation unless otherwise noted.  HAND DOMINANCE: Left  ADLs: Eating: ind Grooming: denture care, some difficulty using one hand to replace/remove dentures Upper body dressing: ind with pullover shirt with extra time, some pain of affected UE Lower body dressing: ind with extra time, difficulty with zippers d/t using L  hand Toileting: ind Bathing: sponge bathing, difficulty d/t one hand, assistance from spouse to wash back Tub shower transfers: currently sponge bathing  FUNCTIONAL OUTCOME MEASURES: Quick Dash: 90.9% deficit     UPPER EXTREMITY ROM:     Active ROM Right eval Left eval  Shoulder flexion Unable to fully assess d/t presence of sling WFL  Shoulder abduction Unable to fully assess d/t presence of sling   Shoulder adduction    Shoulder  extension    Shoulder internal rotation    Shoulder external rotation    Elbow flexion WFL   Elbow extension WFL   Wrist flexion    Wrist extension    Wrist ulnar deviation    Wrist radial deviation    Wrist pronation    Wrist supination    (Blank rows = not tested)  04/02/23 - Unable to assess ROM of affected hand d/t recent surgery and current precautions and presence of bulky compressive dressing.  Active ROM Right eval Left eval  Thumb MCP (0-60)      WFL                           Thumb IP (0-80)    Thumb Radial abd/add (0-55)    Thumb Palmar abd/add (0-45)    Thumb Opposition to Small Finger    Index MCP (0-90)    Index PIP (0-100)    Index DIP (0-70)     Long MCP (0-90)     Long PIP (0-100)     Long DIP (0-70)     Ring MCP (0-90)     Ring PIP (0-100)     Ring DIP (0-70)     Little MCP (0-90)     Little PIP (0-100)     Little DIP (0-70)     (Blank rows = not tested)  RUE 04/08/23 Wrist flex = 55*, ext = 45*  Thumb IP joint = 45* Shoulder, elbow, forearm and hand ROM WFL's  HAND FUNCTION: Grip strength: Unable to assess secondary to current  precautions  COORDINATION: Unable to assess secondary to current precautions  SENSATION: Moderate tingling/numbness in B fingers  EDEMA: Mild in fingers, unable to assess full hand d/t presence of bulky compressive covering  COGNITION: Overall cognitive status: Within functional limits for tasks assessed  OBSERVATIONS: Pt was pleasant and appeared well-kept. Pt currently wearing sling of RUE with affected hand wrapped in Ace wrap as bulky compressive dressing.   TREATMENT DATE:    04/14/23                                                                                                                    TherEx Review of HEP - to improve carryover, to improve affected UE AROM.   Pt still required cues to not move thumb with wrist ex's and with cleaning hand.   Fabricated and fitted hand based CMC splint in prep for 4 weeks post-op but did not issue today.   Pt shown 3 week post-op exercise to begin on Friday 04/16/23 - stabilizing base of thumb at first metacarpal and CMC joint while performing MP flexion and extension to neutral. Had pt return demo on opposite hand, and then affected hand twice to ensure proper carryover. Pt instructed to begin this on Friday as she is just shy 2 days of 3 weeks post-op.   Therapist cleaned hand/forearm with cues to keep thumb still as pt still  wants to open thumb into radial abduction. Therapist then cleaned thumb spica splint with rubbing alcohol and placed back on pt prior to leaving  PATIENT EDUCATION: Education details: see above  Person educated: Patient and (soon to be) daughter-n-law Education method: Explanation, Demonstration, Verbal cues, and Handouts Education comprehension: verbalized understanding, returned demonstration, verbal cues required, and needs further education  HOME EXERCISE PROGRAM: 04/08/23: splint wear and care, hygiene care, precautions, initial A/ROM HEP for RUE (NOT for CMC/MP joint of thumb)  GOALS: Goals reviewed  with patient? Yes  SHORT TERM GOALS: Target date: 04/23/23  Pt will obtain protective, custom forearm based thumb spica splint/orthotic and ind recall splint wear/care schedule. Baseline: Pt currently has compressive bulky dressing on RUE with sling. Goal status: IN PROGRESS  2.  Pt will demo understanding of initial RUE HEP to improve pain levels and prerequisite motion for functional use of her R hand/thumb. Baseline: new to outpt OT Goal status: IN PROGRESS  3.  Pt will return demo of scar management techniques and edema management strategies. Baseline: new to outpt OT Goal status: INITIAL   LONG TERM GOALS: Target date: 05/14/23  Pt will be ind with updated RUE HEP Baseline: new to outpt OT Goal status: INITIAL  2.  Patient will demonstrate at least 16% improvement with quick Dash score (reporting 74.9% disability or less) indicating improved functional use of affected extremity.  Baseline: 90.9% deficit  Goal status: INITIAL  3.  Pt will improve grip strength in R hand from unable to assess to at least 15 lbs for functional use at home and in ADLs/IADLs per precautions and protocol. Baseline: unable to assess secondary to current precautions Goal status: INITIAL  4.  Pt will improve A/ROM in R thumb to PIP (or greater) of R small finger, to have functional motion for tasks like ADL's, reach and grasp  Baseline: unable to assess AROM secondary to current precautions and presence of compressive bulky dressing. Goal status: INITIAL  5.  Pt will improve R hand functional use to at least be Mod I for basic ADL's (ie, brushing her hair, bathing, brushing her teeth and folding laundry) to assist in ability to carry out self-care and higher-level home-care tasks with less difficulty. Baseline: unable to assess, compensating with L hand for all tasks d/t NWB RUE Goal status: INITIAL  6. Patient will demo improved FM coordination as evidenced by completing nine-hole peg with use of  RUE in 40 seconds or less.  Baseline: unable to assess secondary to current precautions and presence of bulky compressive dressing.  Goal Status: INITIAL  ASSESSMENT:  CLINICAL IMPRESSION: Pt benefited from additional education today to improve carryover of HEP and to improve understanding of precautions. Pt would benefit from skilled OT services in the outpatient setting to work on impairments as noted below to help pt return to PLOF as able.     PERFORMANCE DEFICITS: in functional skills including ADLs, IADLs, coordination, dexterity, proprioception, sensation, edema, ROM, strength, pain, Fine motor control, Gross motor control, mobility, body mechanics, endurance, wound, skin integrity, and UE functional use, cognitive skills including  none , and psychosocial skills including environmental adaptation.   IMPAIRMENTS: are limiting patient from ADLs, IADLs, rest and sleep, leisure, and social participation.   COMORBIDITIES: may have co-morbidities  that affects occupational performance. Patient will benefit from skilled OT to address above impairments and improve overall function.  MODIFICATION OR ASSISTANCE TO COMPLETE EVALUATION: Min-Moderate modification of tasks or assist with assess necessary to complete an  evaluation.  OT OCCUPATIONAL PROFILE AND HISTORY: Detailed assessment: Review of records and additional review of physical, cognitive, psychosocial history related to current functional performance.  CLINICAL DECISION MAKING: Moderate - several treatment options, min-mod task modification necessary  REHAB POTENTIAL: Good  EVALUATION COMPLEXITY: Moderate      PLAN:  OT FREQUENCY: 1-2x/week (1x per week for first week to fabricate splint, 2x per week for 6 weeks for remainder of POC)   OT DURATION: 7 weeks + eval  PLANNED INTERVENTIONS: 97168 OT Re-evaluation, 97535 self care/ADL training, 96295 therapeutic exercise, 97530 therapeutic activity, 97112 neuromuscular  re-education, 97140 manual therapy, 97035 ultrasound, 97018 paraffin, 28413 fluidotherapy, 97010 moist heat, 97010 cryotherapy, 97760 Orthotics management and training, 24401 Splinting (initial encounter), [id number] Subsequent splinting/medication, passive range of motion, functional mobility training, energy conservation, patient/family education, and DME and/or AE instructions  RECOMMENDED OTHER SERVICES: N/A  CONSULTED AND AGREED WITH PLAN OF CARE: Patient  PLAN FOR NEXT SESSION:  Progress fully to 3 week post-op protocol - pt most likely does not need P/ROM to wrist as she demo wrist ROM WFL's today    For all possible CPT codes, reference the Planned Interventions line above.     Check all conditions that are expected to impact treatment: {Conditions expected to impact treatment:Musculoskeletal disorders and Active major medical illness  (Lupus)  If treatment provided at initial evaluation, no treatment charged due to lack of authorization.        Sheran Lawless, OT 04/14/2023, 12:45 PM

## 2023-04-16 ENCOUNTER — Encounter: Payer: Medicaid Other | Admitting: Occupational Therapy

## 2023-04-19 ENCOUNTER — Ambulatory Visit: Payer: Medicaid Other | Admitting: Occupational Therapy

## 2023-04-20 ENCOUNTER — Ambulatory Visit: Admitting: Pharmacist

## 2023-04-20 ENCOUNTER — Ambulatory Visit: Payer: Self-pay | Admitting: Internal Medicine

## 2023-04-20 MED ORDER — PREDNISONE 10 MG PO TABS: ORAL_TABLET | ORAL | 0 refills | Status: DC

## 2023-04-20 NOTE — Telephone Encounter (Signed)
 Copied from CRM [id number]. Topic: Clinical - Red Word Triage >> Apr 20, 2023  4:15 PM Gildardo Pounds wrote: Red Word that prompted transfer to Nurse Triage: Patient states she has gout in both feet. Her appointment was scheduled by mistake with the counselor for [facility]. No appointment available until April. Per supv, call patient and triage. No answer when I call. Please reach out to patient to assist. Callback number is [phone]

## 2023-04-20 NOTE — Telephone Encounter (Signed)
 This RN attempted return call attempt #3. Disconnected. LVM and will route to office.

## 2023-04-20 NOTE — Addendum Note (Signed)
 Addended by: Jonah Blue B on: 04/20/2023 05:58 PM   Modules accepted: Orders

## 2023-04-20 NOTE — Telephone Encounter (Signed)
 Let patient know I sent a prescription for prednisone to [facility] for her to take for the acute gout flare.

## 2023-04-21 DIAGNOSIS — F331 Major depressive disorder, recurrent, moderate: Secondary | ICD-10-CM | POA: Diagnosis not present

## 2023-04-21 NOTE — Telephone Encounter (Signed)
Patient notified Rx at pharmacy 

## 2023-04-21 NOTE — Telephone Encounter (Signed)
 Duplicate or CRM containing same information  Copied from CRM [id number]. Topic: Clinical - Medication Question >> Apr 20, 2023  3:26 PM Gery Pray wrote: Reason for CRM: Patient called in requesting to be prescribed Colchicine for her Gout. Patient states that her foot hurts. Please contact patient at [phone]

## 2023-04-22 MED ORDER — COLCHICINE 0.6 MG PO TABS: ORAL_TABLET | ORAL | 0 refills | Status: DC

## 2023-04-22 NOTE — Telephone Encounter (Signed)
 Called & spoke to the patient. Verified name & DOB. Informed that colchicine has been sent to her pharmacy.  Patient confirmed that she has received her prednisone. No further questions at this time.

## 2023-04-22 NOTE — Telephone Encounter (Signed)
 Prescription sent for Colchicine/.

## 2023-04-22 NOTE — Addendum Note (Signed)
 Addended by: Jonah Blue B on: 04/22/2023 07:24 AM   Modules accepted: Orders

## 2023-04-23 ENCOUNTER — Ambulatory Visit: Payer: Medicaid Other | Admitting: Occupational Therapy

## 2023-04-23 DIAGNOSIS — R208 Other disturbances of skin sensation: Secondary | ICD-10-CM

## 2023-04-23 DIAGNOSIS — R29898 Other symptoms and signs involving the musculoskeletal system: Secondary | ICD-10-CM

## 2023-04-23 DIAGNOSIS — R278 Other lack of coordination: Secondary | ICD-10-CM

## 2023-04-23 DIAGNOSIS — M6281 Muscle weakness (generalized): Secondary | ICD-10-CM

## 2023-04-23 DIAGNOSIS — M25541 Pain in joints of right hand: Secondary | ICD-10-CM

## 2023-04-23 NOTE — Therapy (Signed)
 OUTPATIENT OCCUPATIONAL THERAPY ORTHO TREATMENT  Patient Name: Stacy [name] [facility] MRN: [id number] DOB:[birth year], 63 y.o., female Today's Date: 04/23/2023  PCP: Marcine Matar, MD  REFERRING PROVIDER: Samuella Cota, MD   END OF SESSION:  OT End of Session - 04/23/23 1641     Visit Number 5    Number of Visits 14   including eval   Date for OT Re-Evaluation 05/14/23    Authorization Type [employer]    OT Start Time 1017    OT Stop Time 1058    OT Time Calculation (min) 41 min    Activity Tolerance Patient tolerated treatment well    Behavior During Therapy WFL for tasks assessed/performed                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   Asthma    COPD (chronic obstructive pulmonary disease) (HCC)    Depression    Diabetes mellitus    Type II   GERD (gastroesophageal reflux disease)    Pt on Protonix daily   Glaucoma    Gout    Headache(784.0)    otc meds prn   Hyperlipidemia    Hypertension    Irritable bowel syndrome 11/19/2010   Neuropathy    Pneumonia YRS AGO   Restless legs    Shortness of breath    07/14/2018- uses  4 times a day   Sleep apnea    Past Surgical History:  Procedure Laterality Date   BACK SURGERY     feb 21, 23   CARPAL TUNNEL RELEASE Right 03/26/2023   Procedure: RIGHT CARPAL TUNNEL RELEASE;  Surgeon: Samuella Cota, MD;  Location: [facility];  Service: Orthopedics;  Laterality: Right;   CARPOMETACARPEL SUSPENSION PLASTY Right 03/26/2023   Procedure: RIGHT THUMB CARPOMETACARPEL (CMC)  ARTHROPLASTY WITH INTERNAL BRACE;  Surgeon: Samuella Cota, MD;  Location: [facility];  Service: Orthopedics;  Laterality: Right;   CHOLECYSTECTOMY     COLONOSCOPY     ENDOMETRIAL ABLATION  10/2010   HERNIA REPAIR     umbicial hernia   JOINT REPLACEMENT Left 03/14/2019    Dr. Magnus Ivan hip   KNEE ARTHROSCOPY Left    06/07/2017 Dr. August [facility]   KNEE ARTHROSCOPY Left 03/21/2020   Procedure: LEFT KNEE ARTHROSCOPY WITH PARTIAL MEDIAL MENISCECTOMY;  Surgeon: [name], MD;  Location: [facility];  Service: Orthopedics;  Laterality: Left;   KNEE CLOSED REDUCTION Right 12/06/2015   Procedure: CLOSED MANIPULATION RIGHT KNEE;  Surgeon: Kerrin Champagne, MD;  [facility]: [facility];  Service: Orthopedics;  Laterality: Right;   KNEE CLOSED REDUCTION Right 01/17/2016   Procedure: CLOSED MANIPULATION RIGHT KNEE;  Surgeon: Kerrin Champagne, MD;  Location: [facility];  Service: Orthopedics;  Laterality: Right;   KNEE JOINT MANIPULATION Right 12/06/2015   LACRIMAL TUBE INSERTION Bilateral 03/01/2019   Procedure: LACRIMAL TUBE INSERTION;  Surgeon: Aura Camps, MD;  Location: [facility];  Service: Ophthalmology;  Laterality: Bilateral;   LACRIMAL TUBE REMOVAL Bilateral 05/03/2019   Procedure: BILATERAL NASOLACRIMAL DUCT PROBING, IIRIGATION AND TUBE REMOVAL BOTH EYES;  Surgeon: Aura Camps, MD;  Location:  SURGERY CENTER;  Service: Ophthalmology;  Laterality: Bilateral;   LUMBAR LAMINECTOMY/DECOMPRESSION MICRODISCECTOMY N/A 06/03/2015   Procedure: Bilateral lateral recess decompression L2-3, L3-4, L4-5;  Surgeon: Fayrene Fearing  Lynne Logan, MD;  Location: [facility];  Service: Orthopedics;  Laterality: N/A;   LUMBAR LAMINECTOMY/DECOMPRESSION MICRODISCECTOMY N/A 10/02/2016   Procedure: Right L5-S1 Lateral Recess Decompression  microdiscectomy;  Surgeon: Kerrin Champagne, MD;  Location: [facility] OR;  Service: Orthopedics;  Laterality: N/A;   LUMBAR LAMINECTOMY/DECOMPRESSION MICRODISCECTOMY N/A 07/15/2018   Procedure: LEFT L3-4 MICRODISCECTOMY;  Surgeon: Kerrin Champagne, MD;  Location: [facility] OR;  Service: Orthopedics;  Laterality: N/A;   svd      x 2   TEAR DUCT PROBING Bilateral 03/01/2019   Procedure: TEAR DUCT PROBING WITH IRRIGATION;  Surgeon: Aura Camps, MD;  Location: [facility];  Service: Ophthalmology;  Laterality: Bilateral;   TOTAL HIP ARTHROPLASTY Left 03/14/2019   Procedure: LEFT TOTAL HIP ARTHROPLASTY ANTERIOR APPROACH;  Surgeon: [name], MD;  Location: [facility];  Service: Orthopedics;  Laterality: Left;   TOTAL KNEE ARTHROPLASTY Right 09/06/2015   Procedure: RIGHT TOTAL KNEE ARTHROPLASTY;  Surgeon: Kerrin Champagne, MD;  Location: [facility];  Service: Orthopedics;  Laterality: Right;   TOTAL KNEE REVISION Right 05/12/2022   Procedure: RIGHT TOTAL KNEE REVISION ARTHROPLASTY;  Surgeon: [name], MD;  Location: [facility];  Service: Orthopedics;  Laterality: Right;   TUBAL LIGATION     UPPER GASTROINTESTINAL ENDOSCOPY  04/28/2011   Patient Active Problem List   Diagnosis Date Noted   Arthritis of carpometacarpal [facility]) joint of right thumb 03/26/2023   Carpal tunnel syndrome, right upper limb 03/26/2023   Status post revision of total replacement of right knee 05/12/2022   History of colonic polyps 03/20/2022   Failed total knee, right, subsequent encounter 03/02/2022   Loose right total knee arthroplasty (HCC) 03/02/2022   Mixed stress and urge urinary incontinence 12/04/2021   Functional fecal incontinence 12/04/2021   IBS (irritable bowel syndrome) 11/19/2021   Spondylolisthesis, lumbar region    Other spondylosis with radiculopathy, lumbar region    Other secondary scoliosis, lumbar region    Fusion of spine of lumbar region 03/25/2021   Colon polyps 06/06/2020   Lupus 06/06/2020   Allergic rhinitis due to animal (cat) (dog) hair and dander 04/08/2020   Allergic rhinitis due to pollen 04/08/2020   Food allergy 04/08/2020   Acute medial meniscus tear, left, subsequent encounter 03/21/2020   Chronic pain of left knee 02/15/2020   Paresthesia of skin 11/16/2019   History of total knee replacement, right 11/16/2019   Tobacco abuse 11/16/2019   Centrilobular emphysema (HCC) 08/10/2019    Incidental lung nodule, > 3mm and < 8mm 08/10/2019   OSA on CPAP 08/10/2019   Dyspnea on exertion 08/02/2019   Hyperlipidemia 08/02/2019   Lumbar radiculopathy 04/24/2019   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   Post laminectomy syndrome 02/23/2019   Abnormality of gait 02/23/2019   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   Unilateral primary osteoarthritis, left hip 12/28/2018   Lesion of skin of left ear 12/26/2018   Primary osteoarthritis of left hip 12/02/2018   Iron deficiency anemia 10/16/2018   Chronic pain syndrome 09/08/2018   Chronic pain of right knee 08/11/2018   Status post lumbar laminectomy 07/15/2018   Peripheral arterial disease (HCC) 04/05/2018   Moderate persistent asthma without complication 06/29/2017   Environmental and seasonal allergies 06/29/2017   Controlled type 2 diabetes mellitus with diabetic polyneuropathy, without long-term current use of insulin (HCC) 06/29/2017   Perennial allergic rhinitis 04/08/2017   Sensorineural hearing loss (SNHL), bilateral 04/08/2017   Chronic pansinusitis 03/25/2017   Eustachian  tube dysfunction, bilateral 03/25/2017   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   Herniation of lumbar intervertebral disc with radiculopathy 10/02/2016    Class: Chronic   Alopecia areata 08/19/2016   Chondromalacia of both patellae 06/03/2015    Class: Chronic   Spinal stenosis, lumbar region, with neurogenic claudication 06/03/2015   Tobacco use disorder 04/25/2015   DJD (degenerative joint disease) of knee 01/04/2015   Hemorrhoid 11/14/2014   Gout of big toe 07/19/2014   Essential hypertension 08/14/2013   Gastroesophageal reflux disease without esophagitis 08/14/2013   COPD (chronic obstructive pulmonary disease) (HCC) 04/17/2011    ONSET DATE: 03/30/2023 (referral date), 03/26/23 (date of surgery, s/p R thumb CMC arthroplasty, R open carpal tunnel release)  REFERRING DIAG: M18.11 (ICD-10-CM) - Arthritis of carpometacarpal (CMC) joint of right  thumb  THERAPY DIAG:  Muscle weakness (generalized)  Other lack of coordination  Other disturbances of skin sensation  Pain in joint of right hand  Other symptoms and signs involving the musculoskeletal system  Rationale for Evaluation and Treatment: Rehabilitation  SUBJECTIVE:   SUBJECTIVE STATEMENT: Pt reported steri strips still present. Pt reported cleaning hand and wrist regularly. Pt reported keeping splint clean as well. Pt reported "short brace" was fabricated last visit though short brace not yet given to pt.  Pt accompanied by: self  PERTINENT HISTORY: 03/26/23 R thumb CMC arthroplasty and R open carpal tunnel release, s/p R TKA 2024, lupus, L hip total arthroplasty, tobacco abuse, hyperlipidemia, OSA on CPAP, essential HTN, COPD, GERD, HPV   03/30/23 Referral Notes:  Splint fabrication S/p [facility] arthroplasty *Thursday  March 6th after 9 am  PRECAUTIONS: Per 5th Edition [place] [facility] protocol, Fall, Other:    Per 03/26/23 OP Note: The patient will be non weight bearing on the right upper extremity in a spica splint. I will see the patient back in the office in 2 weeks for postoperative followup.   RED FLAGS: Hx of lupus   WEIGHT BEARING RESTRICTIONS: Yes NWB R hand , per [facility] arthroplasty protocol   PAIN:  Are you having pain? Yes: NPRS scale: 6 out of 10 Pain location: thumb, along dorsal aspect Pain description: sharp sometimes Aggravating factors: moving a certain way or sleeping with hand hanging down Relieving factors: rest, propping hand in bed  FALLS: Has patient fallen in last 6 months? Yes. Number of falls 1 L knee buckled on Friday last week at surgery center, pt reported no injuries, assistance from nurses  to get up  LIVING ENVIRONMENT: Lives with: lives with their spouse Lives in: House/apartment Stairs: Yes: Internal: 12 steps; none and External: 2 steps; on right going up and on left going up   PLOF: Independent with basic ADLs, transportation  service for community mobility  PATIENT GOALS: "improvement and mobility with the hand"  NEXT MD VISIT: 05/10/23  OBJECTIVE:  Note: Objective measures were completed at Evaluation unless otherwise noted.  HAND DOMINANCE: Left  ADLs: Eating: ind Grooming: denture care, some difficulty using one hand to replace/remove dentures Upper body dressing: ind with pullover shirt with extra time, some pain of affected UE Lower body dressing: ind with extra time, difficulty with zippers d/t using L  hand Toileting: ind Bathing: sponge bathing, difficulty d/t one hand, assistance from spouse to wash back Tub shower transfers: currently sponge bathing  FUNCTIONAL OUTCOME MEASURES: Quick Dash: 90.9% deficit     UPPER EXTREMITY ROM:     Active ROM Right eval Left eval  Shoulder flexion Unable to fully assess d/t presence of  sling WFL  Shoulder abduction Unable to fully assess d/t presence of sling   Shoulder adduction    Shoulder extension    Shoulder internal rotation    Shoulder external rotation    Elbow flexion WFL   Elbow extension WFL   Wrist flexion    Wrist extension    Wrist ulnar deviation    Wrist radial deviation    Wrist pronation    Wrist supination    (Blank rows = not tested)  04/02/23 - Unable to assess ROM of affected hand d/t recent surgery and current precautions and presence of bulky compressive dressing.  Active ROM Right eval Left eval  Thumb MCP (0-60)      WFL                           Thumb IP (0-80)    Thumb Radial abd/add (0-55)    Thumb Palmar abd/add (0-45)    Thumb Opposition to Small Finger    Index MCP (0-90)    Index PIP (0-100)    Index DIP (0-70)     Long MCP (0-90)     Long PIP (0-100)     Long DIP (0-70)     Ring MCP (0-90)     Ring PIP (0-100)     Ring DIP (0-70)     Little MCP (0-90)     Little PIP (0-100)     Little DIP (0-70)     (Blank rows = not tested)  RUE 04/08/23 Wrist flex = 55*, ext = 45*  Thumb IP  joint = 45* Shoulder, elbow, forearm and hand ROM WFL's  HAND FUNCTION: Grip strength: Unable to assess secondary to current precautions  COORDINATION: Unable to assess secondary to current precautions  SENSATION: Moderate tingling/numbness in B fingers  EDEMA: Mild in fingers, unable to assess full hand d/t presence of bulky compressive covering  COGNITION: Overall cognitive status: Within functional limits for tasks assessed  OBSERVATIONS: Pt was pleasant and appeared well-kept. Pt currently wearing sling of RUE with affected hand wrapped in Ace wrap as bulky compressive dressing.   TREATMENT DATE:    04/23/23                                                                                                                   TherEx OT educated pt on precautions per protocol. Pt verbalized understanding.  Per pt request, OT reviewed tendon glides and thumb IP ROM HEP. Pt returned demo with extra time and mod v/c.  Neuro Re-Ed Per protocol, OT initiated updated HEP:  OT educated pt on precautions per protocol during new exercises. Pt verbalized understanding and returned demo of motions using unaffected hand before progressing to affected hand. Pt benefited from extra time and repeated education to improve understanding of novel exercises: Access Code: [email or web address] URL: [email or web address] Date: 04/23/2023 Prepared by: Carilyn Goodpasture  Exercises - Thumb Adduction with Thumb Flexion AROM on Table  - 4-6 x daily -  1 sets - 10 reps - Seated Thumb Circumduction AROM  - 4-6 x daily - 1 sets - 10 reps - Thumb Opposition  - 4-6 x daily - 1 sets - 10 reps  Self-Care OT assessed pt's skin integrity, noted dry skin over dorsal aspect of thumb metacarpal possibly d/t splint wear. OT educated pt on skin integrity and care options. Pt verbalized understanding.  OT provided pt with previously fabricated hand-based thumb spica splint. OT educated pt on updated splint wear schedule per  protocol: hand-based splint during day, forearm-based at night. Pt verbalized understanding.   PATIENT EDUCATION: Education details: see above  Person educated: Patient and (soon to be) daughter-n-law Education method: Explanation, Demonstration, Verbal cues, and Handouts Education comprehension: verbalized understanding, returned demonstration, verbal cues required, and needs further education  HOME EXERCISE PROGRAM: 04/08/23: splint wear and care, hygiene care, precautions, initial A/ROM HEP for RUE (NOT for CMC/MP joint of thumb) 04/23/23 - thumb ROM, Access Code: [email or web address], splint wear schedule per protocol: hand-based splint during day, forearm-based at night.   GOALS: Goals reviewed with patient? Yes  SHORT TERM GOALS: Target date: 04/23/23  Pt will obtain protective, custom forearm based thumb spica splint/orthotic and ind recall splint wear/care schedule. Baseline: Pt currently has compressive bulky dressing on RUE with sling. Goal status: IN PROGRESS  2.  Pt will demo understanding of initial RUE HEP to improve pain levels and prerequisite motion for functional use of her R hand/thumb. Baseline: new to outpt OT Goal status: IN PROGRESS  3.  Pt will return demo of scar management techniques and edema management strategies. Baseline: new to outpt OT Goal status: INITIAL   LONG TERM GOALS: Target date: 05/14/23  Pt will be ind with updated RUE HEP Baseline: new to outpt OT Goal status: INITIAL  2.  Patient will demonstrate at least 16% improvement with quick Dash score (reporting 74.9% disability or less) indicating improved functional use of affected extremity.  Baseline: 90.9% deficit  Goal status: INITIAL  3.  Pt will improve grip strength in R hand from unable to assess to at least 15 lbs for functional use at home and in ADLs/IADLs per precautions and protocol. Baseline: unable to assess secondary to current precautions Goal status: INITIAL  4.  Pt will improve A/ROM  in R thumb to PIP (or greater) of R small finger, to have functional motion for tasks like ADL's, reach and grasp  Baseline: unable to assess AROM secondary to current precautions and presence of compressive bulky dressing. Goal status: INITIAL  5.  Pt will improve R hand functional use to at least be Mod I for basic ADL's (ie, brushing her hair, bathing, brushing her teeth and folding laundry) to assist in ability to carry out self-care and higher-level home-care tasks with less difficulty. Baseline: unable to assess, compensating with L hand for all tasks d/t NWB RUE Goal status: INITIAL  6. Patient will demo improved FM coordination as evidenced by completing nine-hole peg with use of RUE in 40 seconds or less.  Baseline: unable to assess secondary to current precautions and presence of bulky compressive dressing.  Goal Status: INITIAL  ASSESSMENT:  CLINICAL IMPRESSION: Pt benefited from additional education today to improve carryover of HEP and to improve understanding of precautions. Pt tolerated tasks fairly well. Pt would benefit from skilled OT services in the outpatient setting to work on impairments as noted below to help pt return to PLOF as able.     PERFORMANCE DEFICITS: in functional skills  including ADLs, IADLs, coordination, dexterity, proprioception, sensation, edema, ROM, strength, pain, Fine motor control, Gross motor control, mobility, body mechanics, endurance, wound, skin integrity, and UE functional use, cognitive skills including  none , and psychosocial skills including environmental adaptation.   IMPAIRMENTS: are limiting patient from ADLs, IADLs, rest and sleep, leisure, and social participation.   COMORBIDITIES: may have co-morbidities  that affects occupational performance. Patient will benefit from skilled OT to address above impairments and improve overall function.  MODIFICATION OR ASSISTANCE TO COMPLETE EVALUATION: Min-Moderate modification of tasks or assist  with assess necessary to complete an evaluation.  OT OCCUPATIONAL PROFILE AND HISTORY: Detailed assessment: Review of records and additional review of physical, cognitive, psychosocial history related to current functional performance.  CLINICAL DECISION MAKING: Moderate - several treatment options, min-mod task modification necessary  REHAB POTENTIAL: Good  EVALUATION COMPLEXITY: Moderate      PLAN:  OT FREQUENCY: 1-2x/week (1x per week for first week to fabricate splint, 2x per week for 6 weeks for remainder of POC)   OT DURATION: 7 weeks + eval  PLANNED INTERVENTIONS: 97168 OT Re-evaluation, 97535 self care/ADL training, 65784 therapeutic exercise, 97530 therapeutic activity, 97112 neuromuscular re-education, 97140 manual therapy, 97035 ultrasound, 97018 paraffin, 69629 fluidotherapy, 97010 moist heat, 97010 cryotherapy, 97760 Orthotics management and training, 52841 Splinting (initial encounter), [id number] Subsequent splinting/medication, passive range of motion, functional mobility training, energy conservation, patient/family education, and DME and/or AE instructions  RECOMMENDED OTHER SERVICES: N/A  CONSULTED AND AGREED WITH PLAN OF CARE: Patient  PLAN FOR NEXT SESSION:  Assess progress with 4-week post-op protocol - pt most likely does not need P/ROM to wrist as she demo wrist ROM WFL's today How is new hand-based splint? - Adjustments PRN Review HEP especially week 4 exercises     For all possible CPT codes, reference the Planned Interventions line above.     Check all conditions that are expected to impact treatment: {Conditions expected to impact treatment:Musculoskeletal disorders and Active major medical illness  (Lupus)  If treatment provided at initial evaluation, no treatment charged due to lack of authorization.        [name], OT 04/23/2023, 4:55 PM

## 2023-04-26 ENCOUNTER — Ambulatory Visit: Payer: Medicaid Other | Admitting: Occupational Therapy

## 2023-04-26 DIAGNOSIS — R208 Other disturbances of skin sensation: Secondary | ICD-10-CM

## 2023-04-26 DIAGNOSIS — R29898 Other symptoms and signs involving the musculoskeletal system: Secondary | ICD-10-CM

## 2023-04-26 DIAGNOSIS — R278 Other lack of coordination: Secondary | ICD-10-CM

## 2023-04-26 DIAGNOSIS — M6281 Muscle weakness (generalized): Secondary | ICD-10-CM

## 2023-04-26 DIAGNOSIS — M25541 Pain in joints of right hand: Secondary | ICD-10-CM

## 2023-04-26 NOTE — Therapy (Signed)
 OUTPATIENT OCCUPATIONAL THERAPY ORTHO TREATMENT  Patient Name: [name] MRN: [id number] DOB:[birth year], 63 y.o., female Today's Date: 04/26/2023  PCP: Marcine Matar, MD  REFERRING PROVIDER: Samuella Cota, MD   END OF SESSION:  OT End of Session - 04/26/23 7829     Visit Number 6    Number of Visits 14   including eval   Date for OT Re-Evaluation 05/14/23    Authorization Type [employer] Medicaid    OT Start Time 0803    OT Stop Time 0843    OT Time Calculation (min) 40 min    Activity Tolerance Patient tolerated treatment well    Behavior During Therapy WFL for tasks assessed/performed                 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   Asthma    COPD (chronic obstructive pulmonary disease) (HCC)    Depression    Diabetes mellitus    Type II   GERD (gastroesophageal reflux disease)    Pt on Protonix daily   Glaucoma    Gout    Headache(784.0)    otc meds prn   Hyperlipidemia    Hypertension    Irritable bowel syndrome 11/19/2010   Neuropathy    Pneumonia YRS AGO   Restless legs    Shortness of breath    07/14/2018- uses  4 times a day   Sleep apnea    Past Surgical History:  Procedure Laterality Date   BACK SURGERY     feb 21, 23   CARPAL TUNNEL RELEASE Right 03/26/2023   Procedure: RIGHT CARPAL TUNNEL RELEASE;  Surgeon: Samuella Cota, MD;  Location: [facility];  Service: Orthopedics;  Laterality: Right;   CARPOMETACARPEL SUSPENSION PLASTY Right 03/26/2023   Procedure: RIGHT THUMB CARPOMETACARPEL (CMC)  ARTHROPLASTY WITH INTERNAL BRACE;  Surgeon: Samuella Cota, MD;  Location: [facility];  Service: Orthopedics;  Laterality: Right;   CHOLECYSTECTOMY     COLONOSCOPY     ENDOMETRIAL ABLATION  10/2010   HERNIA REPAIR     umbicial hernia   JOINT REPLACEMENT Left 03/14/2019    Dr. Magnus Ivan hip   KNEE ARTHROSCOPY Left    06/07/2017 Dr. August [facility]   KNEE ARTHROSCOPY Left 03/21/2020   Procedure: LEFT KNEE ARTHROSCOPY WITH PARTIAL MEDIAL MENISCECTOMY;  Surgeon: [name], MD;  Location: [facility];  Service: Orthopedics;  Laterality: Left;   KNEE CLOSED REDUCTION Right 12/06/2015   Procedure: CLOSED MANIPULATION RIGHT KNEE;  Surgeon: Kerrin Champagne, MD;  [facility];  Service: Orthopedics;  Laterality: Right;   KNEE CLOSED REDUCTION Right 01/17/2016   Procedure: CLOSED MANIPULATION RIGHT KNEE;  Surgeon: Kerrin Champagne, MD;  [facility];  Service: Orthopedics;  Laterality: Right;   KNEE JOINT MANIPULATION Right 12/06/2015   LACRIMAL TUBE INSERTION Bilateral 03/01/2019   Procedure: LACRIMAL TUBE INSERTION;  Surgeon: Aura Camps, MD;  Location[facility];  Service: Ophthalmology;  Laterality: Bilateral;   LACRIMAL TUBE REMOVAL Bilateral 05/03/2019   Procedure: BILATERAL NASOLACRIMAL DUCT PROBING, IIRIGATION AND TUBE REMOVAL BOTH EYES;  Surgeon: Aura Camps, MD;  Location: [facility];  Service: Ophthalmology;  Laterality: Bilateral;   LUMBAR LAMINECTOMY/DECOMPRESSION MICRODISCECTOMY N/A 06/03/2015   Procedure: Bilateral lateral recess decompression L2-3, L3-4, L4-5;  Surgeon:  Kerrin Champagne, MD;  Location: [facility] OR;  Service: Orthopedics;  Laterality: N/A;   LUMBAR LAMINECTOMY/DECOMPRESSION MICRODISCECTOMY N/A 10/02/2016   Procedure: Right L5-S1 Lateral Recess Decompression  microdiscectomy;  Surgeon: Kerrin Champagne, MD;  Location: [facility] OR;  Service: Orthopedics;  Laterality: N/A;   LUMBAR LAMINECTOMY/DECOMPRESSION MICRODISCECTOMY N/A 07/15/2018   Procedure: LEFT L3-4 MICRODISCECTOMY;  Surgeon: Kerrin Champagne, MD;  Location: [facility] OR;  Service: Orthopedics;  Laterality: N/A;   svd      x 2   TEAR DUCT PROBING Bilateral 03/01/2019   Procedure: TEAR DUCT PROBING WITH IRRIGATION;  Surgeon: Aura Camps, MD;  Location: [facility];  Service: Ophthalmology;  Laterality: Bilateral;   TOTAL HIP ARTHROPLASTY Left 03/14/2019   Procedure: LEFT TOTAL HIP ARTHROPLASTY ANTERIOR APPROACH;  Surgeon: [name], MD;  [facility];  Service: Orthopedics;  Laterality: Left;   TOTAL KNEE ARTHROPLASTY Right 09/06/2015   Procedure: RIGHT TOTAL KNEE ARTHROPLASTY;  Surgeon: Kerrin Champagne, MD;  [facility];  Service: Orthopedics;  Laterality: Right;   TOTAL KNEE REVISION Right 05/12/2022   Procedure: RIGHT TOTAL KNEE REVISION ARTHROPLASTY;  Surgeon: [name], MD;  Location[facility];  Service: Orthopedics;  Laterality: Right;   TUBAL LIGATION     UPPER GASTROINTESTINAL ENDOSCOPY  04/28/2011   Patient Active Problem List   Diagnosis Date Noted   Arthritis of carpometacarpal [facility]) joint of right thumb 03/26/2023   Carpal tunnel syndrome, right upper limb 03/26/2023   Status post revision of total replacement of right knee 05/12/2022   History of colonic polyps 03/20/2022   Failed total knee, right, subsequent encounter 03/02/2022   Loose right total knee arthroplasty (HCC) 03/02/2022   Mixed stress and urge urinary incontinence 12/04/2021   Functional fecal incontinence 12/04/2021   IBS (irritable bowel syndrome) 11/19/2021   Spondylolisthesis, lumbar region    Other spondylosis with radiculopathy, lumbar region    Other secondary scoliosis, lumbar region    Fusion of spine of lumbar region 03/25/2021   Colon polyps 06/06/2020   Lupus 06/06/2020   Allergic rhinitis due to animal (cat) (dog) hair and dander 04/08/2020   Allergic rhinitis due to pollen 04/08/2020   Food allergy 04/08/2020   Acute medial meniscus tear, left, subsequent encounter 03/21/2020   Chronic pain of left knee 02/15/2020   Paresthesia of skin 11/16/2019   History of total knee replacement, right 11/16/2019   Tobacco abuse 11/16/2019   Centrilobular emphysema (HCC) 08/10/2019    Incidental lung nodule, > 3mm and < 8mm 08/10/2019   OSA on CPAP 08/10/2019   Dyspnea on exertion 08/02/2019   Hyperlipidemia 08/02/2019   Lumbar radiculopathy 04/24/2019   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   Post laminectomy syndrome 02/23/2019   Abnormality of gait 02/23/2019   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   Unilateral primary osteoarthritis, left hip 12/28/2018   Lesion of skin of left ear 12/26/2018   Primary osteoarthritis of left hip 12/02/2018   Iron deficiency anemia 10/16/2018   Chronic pain syndrome 09/08/2018   Chronic pain of right knee 08/11/2018   Status post lumbar laminectomy 07/15/2018   Peripheral arterial disease (HCC) 04/05/2018   Moderate persistent asthma without complication 06/29/2017   Environmental and seasonal allergies 06/29/2017   Controlled type 2 diabetes mellitus with diabetic polyneuropathy, without long-term current use of insulin (HCC) 06/29/2017   Perennial allergic rhinitis 04/08/2017   Sensorineural hearing loss (SNHL), bilateral 04/08/2017   Chronic pansinusitis 03/25/2017  Eustachian tube dysfunction, bilateral 03/25/2017   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   Herniation of lumbar intervertebral disc with radiculopathy 10/02/2016    Class: Chronic   Alopecia areata 08/19/2016   Chondromalacia of both patellae 06/03/2015    Class: Chronic   Spinal stenosis, lumbar region, with neurogenic claudication 06/03/2015   Tobacco use disorder 04/25/2015   DJD (degenerative joint disease) of knee 01/04/2015   Hemorrhoid 11/14/2014   Gout of big toe 07/19/2014   Essential hypertension 08/14/2013   Gastroesophageal reflux disease without esophagitis 08/14/2013   COPD (chronic obstructive pulmonary disease) (HCC) 04/17/2011    ONSET DATE: 03/30/2023 (referral date), 03/26/23 (date of surgery, s/p R thumb CMC arthroplasty, R open carpal tunnel release)  REFERRING DIAG: M18.11 (ICD-10-CM) - Arthritis of carpometacarpal (CMC) joint of right  thumb  THERAPY DIAG:  Muscle weakness (generalized)  Other lack of coordination  Other disturbances of skin sensation  Pain in joint of right hand  Other symptoms and signs involving the musculoskeletal system  Rationale for Evaluation and Treatment: Rehabilitation  SUBJECTIVE:   SUBJECTIVE STATEMENT: Pt reported sometimes sleeping on R side which leads to pain at R arm. Pt reported new splint "feels good."  Pt accompanied by: self  PERTINENT HISTORY: 03/26/23 R thumb CMC arthroplasty and R open carpal tunnel release, s/p R TKA 2024, lupus, L hip total arthroplasty, tobacco abuse, hyperlipidemia, OSA on CPAP, essential HTN, COPD, GERD, HPV   03/30/23 Referral Notes:  Splint fabrication S/p [facility] arthroplasty *Thursday  March 6th after 9 am  PRECAUTIONS: Per 5th Edition [place] Hand protocol, Fall, Other:    Per 03/26/23 OP Note: The patient will be non weight bearing on the right upper extremity in a spica splint. I will see the patient back in the office in 2 weeks for postoperative followup.   RED FLAGS: Hx of lupus   WEIGHT BEARING RESTRICTIONS: Yes NWB R hand , per [facility] arthroplasty protocol   PAIN:  Are you having pain? Yes: NPRS scale: 5 out of 10 Pain location: thumb, along dorsal aspect Pain description: dull, "not too sharp" Aggravating factors: moving a certain way or sleeping with hand hanging down Relieving factors: rest, propping hand in bed  FALLS: Has patient fallen in last 6 months? Yes. Number of falls 1 L knee buckled on Friday last week at surgery center, pt reported no injuries, assistance from nurses  to get up  LIVING ENVIRONMENT: Lives with: lives with their spouse Lives in: House/apartment Stairs: Yes: Internal: 12 steps; none and External: 2 steps; on right going up and on left going up   PLOF: Independent with basic ADLs, transportation service for community mobility  PATIENT GOALS: "improvement and mobility with the hand"  NEXT MD VISIT:  05/10/23  OBJECTIVE:  Note: Objective measures were completed at Evaluation unless otherwise noted.  HAND DOMINANCE: Left  ADLs: Eating: ind Grooming: denture care, some difficulty using one hand to replace/remove dentures Upper body dressing: ind with pullover shirt with extra time, some pain of affected UE Lower body dressing: ind with extra time, difficulty with zippers d/t using L  hand Toileting: ind Bathing: sponge bathing, difficulty d/t one hand, assistance from spouse to wash back Tub shower transfers: currently sponge bathing  FUNCTIONAL OUTCOME MEASURES: Quick Dash: 90.9% deficit     UPPER EXTREMITY ROM:     Active ROM Right eval Left eval  Shoulder flexion Unable to fully assess d/t presence of sling WFL  Shoulder abduction Unable to fully assess d/t presence of sling  Shoulder adduction    Shoulder extension    Shoulder internal rotation    Shoulder external rotation    Elbow flexion WFL   Elbow extension WFL   Wrist flexion    Wrist extension    Wrist ulnar deviation    Wrist radial deviation    Wrist pronation    Wrist supination    (Blank rows = not tested)  04/02/23 - Unable to assess ROM of affected hand d/t recent surgery and current precautions and presence of bulky compressive dressing.  Active ROM Right eval Left eval  Thumb MCP (0-60)      WFL                           Thumb IP (0-80)    Thumb Radial abd/add (0-55)    Thumb Palmar abd/add (0-45)    Thumb Opposition to Small Finger    Index MCP (0-90)    Index PIP (0-100)    Index DIP (0-70)     Long MCP (0-90)     Long PIP (0-100)     Long DIP (0-70)     Ring MCP (0-90)     Ring PIP (0-100)     Ring DIP (0-70)     Little MCP (0-90)     Little PIP (0-100)     Little DIP (0-70)     (Blank rows = not tested)  RUE 04/08/23 Wrist flex = 55*, ext = 45*  Thumb IP joint = 45* Shoulder, elbow, forearm and hand ROM WFL's  HAND FUNCTION: Grip strength: Unable to assess  secondary to current precautions  COORDINATION: Unable to assess secondary to current precautions  SENSATION: Moderate tingling/numbness in B fingers  EDEMA: Mild in fingers, unable to assess full hand d/t presence of bulky compressive covering  COGNITION: Overall cognitive status: Within functional limits for tasks assessed  OBSERVATIONS: Pt was pleasant and appeared well-kept. Pt currently wearing sling of RUE with affected hand wrapped in Ace wrap as bulky compressive dressing.   TREATMENT DATE:                                                                                                               Self-Care OT educated pt on sleep positioning at mat with pillows - to decrease pain of affected UE, to improve sleep quality. Handout provided, see pt instructions. Pt acknowledged understanding.  OT reviewed splint wear schedule. Pt verbalized understanding.  OT educated pt on precautions and pt verbalized understanding.   OT assessed scar site and noted continued presence of steri strips. Scar site healing well though pt hypersensitive to light touch at proximal end of scar site at palm.   TherEx OT reviewed previous HEP and HEP update (palmar ADD) - to improve carryover of HEP. Pt benefited from mod v/c and extra time for correct completion of HEP: Thumb IP flexion with splint donned - 10 reps  Access Code: [email or web address] URL: [email or web address] Date: 04/26/2023 Prepared by: Carilyn Goodpasture  Exercises - Thumb Adduction with thumb flexion AROM on table  - 4-6 x daily - 1 sets - 10 reps - Seated Thumb Circumduction AROM  - 4-6 x daily - 1 sets - 10 reps - Thumb Opposition  - 4-6 x daily - 1 sets - 10 reps - Seated Thumb Palmar Abduction Adduction AROM  - 4-6 x daily - 1 sets - 10 reps   PATIENT EDUCATION: Education details: see above  Person educated: Patient and (soon to be) daughter-n-law Education method: Explanation, Demonstration, Verbal cues, and  Handouts Education comprehension: verbalized understanding, returned demonstration, verbal cues required, and needs further education  HOME EXERCISE PROGRAM: 04/08/23: splint wear and care, hygiene care, precautions, initial A/ROM HEP for RUE (NOT for CMC/MP joint of thumb) 04/23/23 - thumb ROM, Access Code: [email or web address], splint wear schedule per protocol: hand-based splint during day, forearm-based at night.  04/26/23 - thumb palmar ADD (access code: same as above)  GOALS: Goals reviewed with patient? Yes  SHORT TERM GOALS: Target date: 04/23/23  Pt will obtain protective, custom forearm based thumb spica splint/orthotic and ind recall splint wear/care schedule. Baseline: Pt currently has compressive bulky dressing on RUE with sling. 04/26/23 - Pt ind recalled splint wear/care schedule. Goal status: MET  2.  Pt will demo understanding of initial RUE HEP to improve pain levels and prerequisite motion for functional use of her R hand/thumb. Baseline: new to outpt OT 04/26/23 - Pt continues to benefit from v/c for completion of HEP. Goal status: IN PROGRESS  3.  Pt will return demo of scar management techniques and edema management strategies. Baseline: new to outpt OT Goal status: INITIAL   LONG TERM GOALS: Target date: 05/14/23  Pt will be ind with updated RUE HEP Baseline: new to outpt OT Goal status: in progress  2.  Patient will demonstrate at least 16% improvement with quick Dash score (reporting 74.9% disability or less) indicating improved functional use of affected extremity.  Baseline: 90.9% deficit  Goal status: INITIAL  3.  Pt will improve grip strength in R hand from unable to assess to at least 15 lbs for functional use at home and in ADLs/IADLs per precautions and protocol. Baseline: unable to assess secondary to current precautions Goal status: INITIAL  4.  Pt will improve A/ROM in R thumb to PIP (or greater) of R small finger, to have functional motion for tasks like  ADL's, reach and grasp  Baseline: unable to assess AROM secondary to current precautions and presence of compressive bulky dressing. 04/26/23 - Pt demo'd thumb opposition to R small finger PIP joint. Goal status: MET  5.  Pt will improve R hand functional use to at least be Mod I for basic ADL's (ie, brushing her hair, bathing, brushing her teeth and folding laundry) to assist in ability to carry out self-care and higher-level home-care tasks with less difficulty. Baseline: unable to assess, compensating with L hand for all tasks d/t NWB RUE Goal status: INITIAL  6. Patient will demo improved FM coordination as evidenced by completing nine-hole peg with use of RUE in 40 seconds or less.  Baseline: unable to assess secondary to current precautions and presence of bulky compressive dressing.  Goal Status: INITIAL  ASSESSMENT:  CLINICAL IMPRESSION: Pt met 1 STG and 1 LTG today, indicating good progress towards goals. Pt continues to benefit from review of HEP, precautions, and education to improve carryover. Pt would benefit from skilled OT services in the outpatient setting to work on impairments as noted  below to help pt return to PLOF as able.     PERFORMANCE DEFICITS: in functional skills including ADLs, IADLs, coordination, dexterity, proprioception, sensation, edema, ROM, strength, pain, Fine motor control, Gross motor control, mobility, body mechanics, endurance, wound, skin integrity, and UE functional use, cognitive skills including  none , and psychosocial skills including environmental adaptation.   IMPAIRMENTS: are limiting patient from ADLs, IADLs, rest and sleep, leisure, and social participation.   COMORBIDITIES: may have co-morbidities  that affects occupational performance. Patient will benefit from skilled OT to address above impairments and improve overall function.  MODIFICATION OR ASSISTANCE TO COMPLETE EVALUATION: Min-Moderate modification of tasks or assist with assess  necessary to complete an evaluation.  OT OCCUPATIONAL PROFILE AND HISTORY: Detailed assessment: Review of records and additional review of physical, cognitive, psychosocial history related to current functional performance.  CLINICAL DECISION MAKING: Moderate - several treatment options, min-mod task modification necessary  REHAB POTENTIAL: Good  EVALUATION COMPLEXITY: Moderate      PLAN:  OT FREQUENCY: 1-2x/week (1x per week for first week to fabricate splint, 2x per week for 6 weeks for remainder of POC)   OT DURATION: 7 weeks + eval  PLANNED INTERVENTIONS: 97168 OT Re-evaluation, 97535 self care/ADL training, 19147 therapeutic exercise, 97530 therapeutic activity, 97112 neuromuscular re-education, 97140 manual therapy, 97035 ultrasound, 97018 paraffin, 82956 fluidotherapy, 97010 moist heat, 97010 cryotherapy, 97760 Orthotics management and training, 21308 Splinting (initial encounter), [id number] Subsequent splinting/medication, passive range of motion, functional mobility training, energy conservation, patient/family education, and DME and/or AE instructions  RECOMMENDED OTHER SERVICES: N/A  CONSULTED AND AGREED WITH PLAN OF CARE: Patient  PLAN FOR NEXT SESSION:  Assess progress with 4-week post-op protocol - pt most likely does not need P/ROM to wrist as she demo wrist ROM WFL's today How is new hand-based splint? - Adjustments PRN Sleep positioning - How is it going? Per pt request: Review HEP especially week 4 exercises Scar management     For all possible CPT codes, reference the Planned Interventions line above.     Check all conditions that are expected to impact treatment: {Conditions expected to impact treatment:Musculoskeletal disorders and Active major medical illness  (Lupus)  If treatment provided at initial evaluation, no treatment charged due to lack of authorization.        [name], OT 04/26/2023, 8:55 AM

## 2023-04-28 ENCOUNTER — Other Ambulatory Visit: Payer: Self-pay | Admitting: Internal Medicine

## 2023-04-28 DIAGNOSIS — F419 Anxiety disorder, unspecified: Secondary | ICD-10-CM

## 2023-04-28 NOTE — Telephone Encounter (Signed)
 Copied from CRM [id number]. Topic: Clinical - Medication Refill >> Apr 28, 2023  2:05 PM Elle L wrote: Most Recent Primary Care Visit:  Provider: Jonah Blue B  Department: [facility]  Visit Type: OFFICE VISIT  Date: 01/08/2023  Medication: hydrOXYzine (ATARAX) 10 MG tablet  Has the patient contacted their pharmacy? Yes  Is this the correct pharmacy for this prescription? Yes  This is the patient's preferred pharmacy:  [facility] - [place], [place] - [address] [place] [address] Phone: [phone] Fax: [phone]   Has the prescription been filled recently? No  Is the patient out of the medication? Yes  Has the patient been seen for an appointment in the last year OR does the patient have an upcoming appointment? Yes  Can we respond through MyChart? No  Agent: Please be advised that Rx refills may take up to 3 business days. We ask that you follow-up with your pharmacy.

## 2023-04-29 ENCOUNTER — Ambulatory Visit: Payer: Medicaid Other | Admitting: Occupational Therapy

## 2023-04-29 DIAGNOSIS — R278 Other lack of coordination: Secondary | ICD-10-CM

## 2023-04-29 DIAGNOSIS — M6281 Muscle weakness (generalized): Secondary | ICD-10-CM

## 2023-04-29 DIAGNOSIS — R29898 Other symptoms and signs involving the musculoskeletal system: Secondary | ICD-10-CM

## 2023-04-29 DIAGNOSIS — M25541 Pain in joints of right hand: Secondary | ICD-10-CM

## 2023-04-29 DIAGNOSIS — R208 Other disturbances of skin sensation: Secondary | ICD-10-CM

## 2023-04-29 MED ORDER — HYDROXYZINE HCL 10 MG PO TABS: ORAL_TABLET | ORAL | 1 refills | Status: DC

## 2023-04-29 NOTE — Patient Instructions (Addendum)
 SCAR MASSAGE  2x per day, approximately 3-5 minute sessions: Remove splint, Gentle scar massage over the palm scar site only, avoid steri strips. Minimal pressure, Gentle circles using the pointer finger of your opposite hand.   Make sure thumb is relaxed. Avoid pain and avoid strong pressure to the hand.  If any drainage occurs or wound opens, stop scar massage. Avoid scar massage to back-of-thumb scar site for now.

## 2023-04-29 NOTE — Telephone Encounter (Signed)
 Requested Prescriptions  Pending Prescriptions Disp Refills   hydrOXYzine (ATARAX) 10 MG tablet 120 tablet 0    Sig: TAKE 1 TABLET IN THE MORNING AND NOON AND 2 TABLETS IN THE EVENING AS NEEDED.     Ear, Nose, and Throat:  Antihistamines 2 Passed - 04/29/2023  5:21 PM      Passed - Cr in normal range and within 360 days    Creat  Date Value Ref Range Status  03/11/2016 0.90 0.50 - 1.05 mg/dL Final    Comment:      For patients > or = 64 years of age: The upper reference limit for Creatinine is approximately 13% higher for people identified as African-American.      Creatinine, Ser  Date Value Ref Range Status  03/19/2023 0.88 0.44 - 1.00 mg/dL Final   Creatinine, POC  Date Value Ref Range Status  06/10/2016 200 mg/dL Final         Passed - Valid encounter within last 12 months    Recent Outpatient Visits           3 months ago Controlled type 2 diabetes mellitus with diabetic polyneuropathy, without long-term current use of insulin (HCC)   [facility] [place]. [facility] Jonah Blue B, MD   7 months ago Controlled type 2 diabetes mellitus with diabetic polyneuropathy, without long-term current use of insulin (HCC)   [facility] [place]. [facility] Jonah Blue B, MD   10 months ago Controlled type 2 diabetes mellitus with diabetic polyneuropathy, without long-term current use of insulin (HCC)   [facility] Of [place]. [facility], Marylene Land M, [place]   11 months ago Coarse tremors   [facility] Of [place]. [facility] Jonah Blue B, MD   11 months ago Type 2 diabetes mellitus with peripheral neuropathy [facility])   [facility] - [facility] Dept Of [place]. [facility] Marcine Matar, MD       Future Appointments             In 1 week Marcine Matar, MD [facility]  [facility] - A Dept Of [place]a [facility]g[place]. [facility]

## 2023-04-29 NOTE — Therapy (Signed)
 OUTPATIENT OCCUPATIONAL THERAPY ORTHO TREATMENT  Patient Name: Stacy [name] [facility] MRN: [id number] DOB:[birth year], 63 y.o., female Today's Date: 04/29/2023  PCP: Marcine Matar, MD  REFERRING PROVIDER: Samuella Cota, MD   END OF SESSION:  OT End of Session - 04/29/23 4098     Visit Number 7    Number of Visits 14   including eval   Date for OT Re-Evaluation 05/14/23    Authorization Type [employer]    OT Start Time 0802    OT Stop Time 0841    OT Time Calculation (min) 39 min    Activity Tolerance Patient tolerated treatment well    Behavior During Therapy WFL for tasks assessed/performed                  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   Asthma    COPD (chronic obstructive pulmonary disease) (HCC)    Depression    Diabetes mellitus    Type II   GERD (gastroesophageal reflux disease)    Pt on Protonix daily   Glaucoma    Gout    Headache(784.0)    otc meds prn   Hyperlipidemia    Hypertension    Irritable bowel syndrome 11/19/2010   Neuropathy    Pneumonia YRS AGO   Restless legs    Shortness of breath    07/14/2018- uses  4 times a day   Sleep apnea    Past Surgical History:  Procedure Laterality Date   BACK SURGERY     feb 21, 23   CARPAL TUNNEL RELEASE Right 03/26/2023   Procedure: RIGHT CARPAL TUNNEL RELEASE;  Surgeon: Samuella Cota, MD;  Location: [facility];  Service: Orthopedics;  Laterality: Right;   CARPOMETACARPEL SUSPENSION PLASTY Right 03/26/2023   Procedure: RIGHT THUMB CARPOMETACARPEL (CMC)  ARTHROPLASTY WITH INTERNAL BRACE;  Surgeon: Samuella Cota, MD;  Location: [facility];  Service: Orthopedics;  Laterality: Right;   CHOLECYSTECTOMY     COLONOSCOPY     ENDOMETRIAL ABLATION  10/2010   HERNIA REPAIR     umbicial hernia   JOINT REPLACEMENT Left 03/14/2019    Dr. Magnus Ivan hip   KNEE ARTHROSCOPY Left    06/07/2017 Dr. August [facility]   KNEE ARTHROSCOPY Left 03/21/2020   Procedure: LEFT KNEE ARTHROSCOPY WITH PARTIAL MEDIAL MENISCECTOMY;  Surgeon: [name], MD;  Location: [facility];  Service: Orthopedics;  Laterality: Left;   KNEE CLOSED REDUCTION Right 12/06/2015   Procedure: CLOSED MANIPULATION RIGHT KNEE;  Surgeon: Kerrin Champagne, MD;  Location: [facility];  Service: Orthopedics;  Laterality: Right;   KNEE CLOSED REDUCTION Right 01/17/2016   Procedure: CLOSED MANIPULATION RIGHT KNEE;  Surgeon: Kerrin Champagne, MD;  Location: [facility] OR;  Service: Orthopedics;  Laterality: Right;   KNEE JOINT MANIPULATION Right 12/06/2015   LACRIMAL TUBE INSERTION Bilateral 03/01/2019   Procedure: LACRIMAL TUBE INSERTION;  Surgeon: Aura Camps, MD;  Location: [facility];  Service: Ophthalmology;  Laterality: Bilateral;   LACRIMAL TUBE REMOVAL Bilateral 05/03/2019   Procedure: BILATERAL NASOLACRIMAL DUCT PROBING, IIRIGATION AND TUBE REMOVAL BOTH EYES;  Surgeon: Aura Camps, MD;  Location:  SURGERY CENTER;  Service: Ophthalmology;  Laterality: Bilateral;   LUMBAR LAMINECTOMY/DECOMPRESSION MICRODISCECTOMY N/A 06/03/2015   Procedure: Bilateral lateral recess decompression L2-3, L3-4, L4-5;  Surgeon: Kerrin Champagne, MD;  Location: [facility] OR;  Service: Orthopedics;  Laterality: N/A;   LUMBAR LAMINECTOMY/DECOMPRESSION MICRODISCECTOMY N/A 10/02/2016   Procedure: Right L5-S1 Lateral Recess Decompression  microdiscectomy;  Surgeon: Kerrin Champagne, MD;  Location: [facility] OR;  Service: Orthopedics;  Laterality: N/A;   LUMBAR LAMINECTOMY/DECOMPRESSION MICRODISCECTOMY N/A 07/15/2018   Procedure: LEFT L3-4 MICRODISCECTOMY;  Surgeon: Kerrin Champagne, MD;  Location: [facility] OR;  Service: Orthopedics;  Laterality: N/A;   svd      x 2   TEAR DUCT PROBING Bilateral 03/01/2019   Procedure: TEAR DUCT PROBING WITH IRRIGATION;  Surgeon:  Aura Camps, MD;  Location: [facility];  Service: Ophthalmology;  Laterality: Bilateral;   TOTAL HIP ARTHROPLASTY Left 03/14/2019   Procedure: LEFT TOTAL HIP ARTHROPLASTY ANTERIOR APPROACH;  Surgeon: [name], MD;  Location: [facility];  Service: Orthopedics;  Laterality: Left;   TOTAL KNEE ARTHROPLASTY Right 09/06/2015   Procedure: RIGHT TOTAL KNEE ARTHROPLASTY;  Surgeon: Kerrin Champagne, MD;  Location: [facility];  Service: Orthopedics;  Laterality: Right;   TOTAL KNEE REVISION Right 05/12/2022   Procedure: RIGHT TOTAL KNEE REVISION ARTHROPLASTY;  Surgeon: [name], MD;  Location: [facility] OR;  Service: Orthopedics;  Laterality: Right;   TUBAL LIGATION     UPPER GASTROINTESTINAL ENDOSCOPY  04/28/2011   Patient Active Problem List   Diagnosis Date Noted   Arthritis of carpometacarpal [facility]) joint of right thumb 03/26/2023   Carpal tunnel syndrome, right upper limb 03/26/2023   Status post revision of total replacement of right knee 05/12/2022   History of colonic polyps 03/20/2022   Failed total knee, right, subsequent encounter 03/02/2022   Loose right total knee arthroplasty (HCC) 03/02/2022   Mixed stress and urge urinary incontinence 12/04/2021   Functional fecal incontinence 12/04/2021   IBS (irritable bowel syndrome) 11/19/2021   Spondylolisthesis, lumbar region    Other spondylosis with radiculopathy, lumbar region    Other secondary scoliosis, lumbar region    Fusion of spine of lumbar region 03/25/2021   Colon polyps 06/06/2020   Lupus 06/06/2020   Allergic rhinitis due to animal (cat) (dog) hair and dander 04/08/2020   Allergic rhinitis due to pollen 04/08/2020   Food allergy 04/08/2020   Acute medial meniscus tear, left, subsequent encounter 03/21/2020   Chronic pain of left knee 02/15/2020   Paresthesia of skin 11/16/2019   History of total knee replacement, right 11/16/2019   Tobacco abuse 11/16/2019   Centrilobular emphysema (HCC)  08/10/2019   Incidental lung nodule, > 3mm and < 8mm 08/10/2019   OSA on CPAP 08/10/2019   Dyspnea on exertion 08/02/2019   Hyperlipidemia 08/02/2019   Lumbar radiculopathy 04/24/2019   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   Post laminectomy syndrome 02/23/2019   Abnormality of gait 02/23/2019   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   Unilateral primary osteoarthritis, left hip 12/28/2018   Lesion of skin of left ear 12/26/2018   Primary osteoarthritis of left hip 12/02/2018   Iron deficiency anemia 10/16/2018   Chronic pain syndrome 09/08/2018   Chronic pain of right knee 08/11/2018   Status post lumbar laminectomy 07/15/2018   Peripheral arterial disease (HCC) 04/05/2018   Moderate persistent asthma without complication 06/29/2017   Environmental and seasonal allergies 06/29/2017   Controlled type 2 diabetes mellitus with diabetic polyneuropathy, without long-term current use of insulin (HCC) 06/29/2017   Perennial allergic rhinitis 04/08/2017   Sensorineural hearing loss (SNHL), bilateral 04/08/2017   Chronic pansinusitis 03/25/2017  Eustachian tube dysfunction, bilateral 03/25/2017   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   Herniation of lumbar intervertebral disc with radiculopathy 10/02/2016    Class: Chronic   Alopecia areata 08/19/2016   Chondromalacia of both patellae 06/03/2015    Class: Chronic   Spinal stenosis, lumbar region, with neurogenic claudication 06/03/2015   Tobacco use disorder 04/25/2015   DJD (degenerative joint disease) of knee 01/04/2015   Hemorrhoid 11/14/2014   Gout of big toe 07/19/2014   Essential hypertension 08/14/2013   Gastroesophageal reflux disease without esophagitis 08/14/2013   COPD (chronic obstructive pulmonary disease) (HCC) 04/17/2011    ONSET DATE: 03/30/2023 (referral date), 03/26/23 (date of surgery, s/p R thumb CMC arthroplasty, R open carpal tunnel release)  REFERRING DIAG: M18.11 (ICD-10-CM) - Arthritis of carpometacarpal (CMC)  joint of right thumb  THERAPY DIAG:  Muscle weakness (generalized)  Other lack of coordination  Other disturbances of skin sensation  Pain in joint of right hand  Other symptoms and signs involving the musculoskeletal system  Rationale for Evaluation and Treatment: Rehabilitation  SUBJECTIVE:   SUBJECTIVE STATEMENT: Pt reported recent fall, see below. Pt reported sometimes feeling off balance when attempting to stand up. Pt reported R hand and new exercises are "feeling good."  Pt accompanied by: self  PERTINENT HISTORY: 03/26/23 R thumb CMC arthroplasty and R open carpal tunnel release, s/p R TKA 2024, lupus, L hip total arthroplasty, tobacco abuse, hyperlipidemia, OSA on CPAP, essential HTN, COPD, GERD, HPV   03/30/23 Referral Notes:  Splint fabrication S/p [facility] arthroplasty *Thursday  March 6th after 9 am  PRECAUTIONS: Per 5th Edition [place] [facility] protocol, Fall, Other:    Per 03/26/23 OP Note: The patient will be non weight bearing on the right upper extremity in a spica splint. I will see the patient back in the office in 2 weeks for postoperative followup.   RED FLAGS: Hx of lupus   WEIGHT BEARING RESTRICTIONS: Yes NWB R hand , per [facility] arthroplasty protocol   PAIN:  Are you having pain? Yes: NPRS scale: 5 out of 10 Pain location: thumb, along dorsal aspect of right thumb Pain description: dull, "not too sharp" Aggravating factors: moving a certain way or sleeping with hand hanging down Relieving factors: rest, propping hand in bed  FALLS: Has patient fallen in last 6 months? Yes. Number of falls 1 L knee buckled on Friday last week at surgery center, pt reported no injuries, assistance from nurses  to get up  04/29/23 update - Pt reported L knee buckled and pt fell down to the ground. Pt denied injuries though required assistance from family to get up.   LIVING ENVIRONMENT: Lives with: lives with their spouse Lives in: House/apartment Stairs: Yes: Internal: 12  steps; none and External: 2 steps; on right going up and on left going up   PLOF: Independent with basic ADLs, transportation service for community mobility  PATIENT GOALS: "improvement and mobility with the hand"  NEXT MD VISIT: 05/10/23  OBJECTIVE:  Note: Objective measures were completed at Evaluation unless otherwise noted.  HAND DOMINANCE: Left  ADLs: Eating: ind Grooming: denture care, some difficulty using one hand to replace/remove dentures Upper body dressing: ind with pullover shirt with extra time, some pain of affected UE Lower body dressing: ind with extra time, difficulty with zippers d/t using L  hand Toileting: ind Bathing: sponge bathing, difficulty d/t one hand, assistance from spouse to wash back Tub shower transfers: currently sponge bathing  FUNCTIONAL OUTCOME MEASURES: Quick Dash: 90.9% deficit  UPPER EXTREMITY ROM:     Active ROM Right eval Left eval  Shoulder flexion Unable to fully assess d/t presence of sling [facility]  Shoulder abduction Unable to fully assess d/t presence of sling   Shoulder adduction    Shoulder extension    Shoulder internal rotation    Shoulder external rotation    Elbow flexion WFL   Elbow extension WFL   Wrist flexion    Wrist extension    Wrist ulnar deviation    Wrist radial deviation    Wrist pronation    Wrist supination    (Blank rows = not tested)  04/02/23 - Unable to assess ROM of affected hand d/t recent surgery and current precautions and presence of bulky compressive dressing.  Active ROM Right eval Left eval  Thumb MCP (0-60)      WFL                           Thumb IP (0-80)    Thumb Radial abd/add (0-55)    Thumb Palmar abd/add (0-45)    Thumb Opposition to Small Finger    Index MCP (0-90)    Index PIP (0-100)    Index DIP (0-70)     Long MCP (0-90)     Long PIP (0-100)     Long DIP (0-70)     Ring MCP (0-90)     Ring PIP (0-100)     Ring DIP (0-70)     Little MCP (0-90)      Little PIP (0-100)     Little DIP (0-70)     (Blank rows = not tested)  RUE 04/08/23 Wrist flex = 55*, ext = 45*  Thumb IP joint = 45* Shoulder, elbow, forearm and hand ROM WFL's  HAND FUNCTION: Grip strength: Unable to assess secondary to current precautions  COORDINATION: Unable to assess secondary to current precautions  SENSATION: Moderate tingling/numbness in B fingers  EDEMA: Mild in fingers, unable to assess full hand d/t presence of bulky compressive covering  COGNITION: Overall cognitive status: Within functional limits for tasks assessed  OBSERVATIONS: Pt was pleasant and appeared well-kept. Pt currently wearing sling of RUE with affected hand wrapped in Ace wrap as bulky compressive dressing.   TREATMENT DATE:                                                                                                               Self-Care Pt appeared off-balance when pt stood up to ambulate to treatment area today. Pt also reported recent fall. OT educated pt on reducing fall risk and Sit-to-stand transitions: sequencing, safety, and body mechanics - to improve safety, to decrease fall risk. Pt returned demo with v/c. OT recommended to pt to f/u with doctor about pt concerns regarding L knee "buckling." Pt verbalized understanding.   OT reviewed splint wear schedule. Pt verbalized understanding.   Manual  Scar massage over palmar scar site only. - for desensitization, to manage scar tissue, to reduce  scar sensitivity. OT avoided scar massage to dorsal aspect of thumb scar site at this time to avoid pressure to [facility] joint. OT educated pt on scar massage purpose and technique. Pt acknowledged understanding. Handout provided, see pt instructions. Pt returned demo with fading v/c.  TherEx Per pt request, Review of previous RUE HEP  - to improve carryover, to improve AROM of affected UE. AROM Thumb palmar ABD/ADD AROM Thumb flex/ext AROM Thumb circumduction - v/c for increased  AROM AROM finger opposition - v/c to extend thumb after completing finger opposition to improve focus on thumb AROM. AROM Wrist flex/ext. Pt returned demo of all with min to no v/c.  HEP progression: RUE PROM thumb IP flex in splint, place-and-hold - hold 10 seconds, 10 reps, 4x per day - to improve affected UE ROM. OT provided handwritten instructions to pt. Pt returned demo.    PATIENT EDUCATION: Education details: see above  Person educated: Patient and (soon to be) daughter-n-law Education method: Explanation, Demonstration, Verbal cues, and Handouts Education comprehension: verbalized understanding, returned demonstration, verbal cues required, and needs further education  HOME EXERCISE PROGRAM: 04/08/23: splint wear and care, hygiene care, precautions, initial A/ROM HEP for RUE (NOT for CMC/MP joint of thumb) 04/23/23 - thumb ROM, Access Code: [email or web address], splint wear schedule per protocol: hand-based splint during day, forearm-based at night.  04/26/23 - thumb palmar ADD (access code: same as above) 04/29/23 - RUE PROM thumb IP flex in splint, place-and-hold, hold 10 seconds, 10 reps, 4x per day  GOALS: Goals reviewed with patient? Yes  SHORT TERM GOALS: Target date: 04/23/23  Pt will obtain protective, custom forearm based thumb spica splint/orthotic and ind recall splint wear/care schedule. Baseline: Pt currently has compressive bulky dressing on RUE with sling. 04/26/23 - Pt ind recalled splint wear/care schedule. Goal status: MET  2.  Pt will demo understanding of initial RUE HEP to improve pain levels and prerequisite motion for functional use of her R hand/thumb. Baseline: new to outpt OT 04/26/23 - Pt continues to benefit from v/c for completion of HEP. 04/29/23 - Pt returned demo of initial RUE HPE with min to no v/c. Goal status: MET  3.  Pt will return demo of scar management techniques and edema management strategies. Baseline: new to outpt OT 04/29/23 - OT initiated scar  massage HEP and pt returned demo.  Goal status: in progress   LONG TERM GOALS: Target date: 05/14/23  Pt will be ind with updated RUE HEP Baseline: new to outpt OT Goal status: in progress  2.  Patient will demonstrate at least 16% improvement with quick Dash score (reporting 74.9% disability or less) indicating improved functional use of affected extremity.  Baseline: 90.9% deficit  Goal status: in progress  3.  Pt will improve grip strength in R hand from unable to assess to at least 15 lbs for functional use at home and in ADLs/IADLs per precautions and protocol. Baseline: unable to assess secondary to current precautions Goal status: INITIAL  4.  Pt will improve A/ROM in R thumb to PIP (or greater) of R small finger, to have functional motion for tasks like ADL's, reach and grasp  Baseline: unable to assess AROM secondary to current precautions and presence of compressive bulky dressing. 04/26/23 - Pt demo'd thumb opposition to R small finger PIP joint. Goal status: MET  5.  Pt will improve R hand functional use to at least be Mod I for basic ADL's (ie, brushing her hair, bathing, brushing her teeth and folding  laundry) to assist in ability to carry out self-care and higher-level home-care tasks with less difficulty. Baseline: unable to assess, compensating with L hand for all tasks d/t NWB RUE Goal status: INITIAL  6. Patient will demo improved FM coordination as evidenced by completing nine-hole peg with use of RUE in 40 seconds or less.  Baseline: unable to assess secondary to current precautions and presence of bulky compressive dressing.  Goal Status: INITIAL  ASSESSMENT:  CLINICAL IMPRESSION: Pt met 1 STG today. Pt demo'ing improved carryover of HEP compared to previous sessions as evidenced by decreased v/c required to return demo. Pt tolerated tasks well and demo'd decreased sensitivity of palmar scar site compared to previous sessions as evidenced by easily tolerating  scar massage. Continued presence of steri strips over both scar sites noted. Pt would benefit from skilled OT services in the outpatient setting to work on impairments as noted below to help pt return to PLOF as able.     PERFORMANCE DEFICITS: in functional skills including ADLs, IADLs, coordination, dexterity, proprioception, sensation, edema, ROM, strength, pain, Fine motor control, Gross motor control, mobility, body mechanics, endurance, wound, skin integrity, and UE functional use, cognitive skills including  none , and psychosocial skills including environmental adaptation.   IMPAIRMENTS: are limiting patient from ADLs, IADLs, rest and sleep, leisure, and social participation.   COMORBIDITIES: may have co-morbidities  that affects occupational performance. Patient will benefit from skilled OT to address above impairments and improve overall function.  MODIFICATION OR ASSISTANCE TO COMPLETE EVALUATION: Min-Moderate modification of tasks or assist with assess necessary to complete an evaluation.  OT OCCUPATIONAL PROFILE AND HISTORY: Detailed assessment: Review of records and additional review of physical, cognitive, psychosocial history related to current functional performance.  CLINICAL DECISION MAKING: Moderate - several treatment options, min-mod task modification necessary  REHAB POTENTIAL: Good  EVALUATION COMPLEXITY: Moderate      PLAN:  OT FREQUENCY: 1-2x/week (1x per week for first week to fabricate splint, 2x per week for 6 weeks for remainder of POC)   OT DURATION: 7 weeks + eval  PLANNED INTERVENTIONS: 97168 OT Re-evaluation, 97535 self care/ADL training, 16109 therapeutic exercise, 97530 therapeutic activity, 97112 neuromuscular re-education, 97140 manual therapy, 97035 ultrasound, 97018 paraffin, 60454 fluidotherapy, 97010 moist heat, 97010 cryotherapy, 97760 Orthotics management and training, 09811 Splinting (initial encounter), [id number] Subsequent splinting/medication,  passive range of motion, functional mobility training, energy conservation, patient/family education, and DME and/or AE instructions  RECOMMENDED OTHER SERVICES: N/A  CONSULTED AND AGREED WITH PLAN OF CARE: Patient  PLAN FOR NEXT SESSION:  Progress 5 weeks post-op - e.g. ?PROM MPJ *if pt can abide by precautions with [facility] supported Review HEP PRN Scar management  At 6 weeks s/p (on or after 05/07/23) - add isometric resistance for APB/opponens, begin weaning out of orthosis during the day (remove for light ADLs 3-4 times per day <1 hour)     For all possible CPT codes, reference the Planned Interventions line above.     Check all conditions that are expected to impact treatment: {Conditions expected to impact treatment:Musculoskeletal disorders and Active major medical illness  (Lupus)  If treatment provided at initial evaluation, no treatment charged due to lack of authorization.        [name], OT 04/29/2023, 9:17 AM

## 2023-05-03 ENCOUNTER — Ambulatory Visit: Payer: Medicaid Other | Admitting: Occupational Therapy

## 2023-05-03 DIAGNOSIS — R29898 Other symptoms and signs involving the musculoskeletal system: Secondary | ICD-10-CM

## 2023-05-03 DIAGNOSIS — M6281 Muscle weakness (generalized): Secondary | ICD-10-CM

## 2023-05-03 DIAGNOSIS — R278 Other lack of coordination: Secondary | ICD-10-CM

## 2023-05-03 DIAGNOSIS — M25541 Pain in joints of right hand: Secondary | ICD-10-CM

## 2023-05-03 DIAGNOSIS — R208 Other disturbances of skin sensation: Secondary | ICD-10-CM

## 2023-05-03 NOTE — Therapy (Signed)
 OUTPATIENT OCCUPATIONAL THERAPY ORTHO TREATMENT  Patient Name: Stacy [name] [facility] MRN: [id number] DOB:August 19, 1960, 63 y.o., female Today's Date: 05/03/2023  PCP: Marcine Matar, MD  REFERRING PROVIDER: Samuella Cota, MD   END OF SESSION:  OT End of Session - 05/03/23 1056     Visit Number 8    Number of Visits 14   including eval   Date for OT Re-Evaluation 05/14/23    Authorization Type [employer] Medicaid    OT Start Time 1005    OT Stop Time 1045    OT Time Calculation (min) 40 min    Activity Tolerance Patient tolerated treatment well    Behavior During Therapy WFL for tasks assessed/performed                   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   Asthma    COPD (chronic obstructive pulmonary disease) (HCC)    Depression    Diabetes mellitus    Type II   GERD (gastroesophageal reflux disease)    Pt on Protonix daily   Glaucoma    Gout    Headache(784.0)    otc meds prn   Hyperlipidemia    Hypertension    Irritable bowel syndrome 11/19/2010   Neuropathy    Pneumonia YRS AGO   Restless legs    Shortness of breath    07/14/2018- uses  4 times a day   Sleep apnea    Past Surgical History:  Procedure Laterality Date   BACK SURGERY     feb 21, 23   CARPAL TUNNEL RELEASE Right 03/26/2023   Procedure: RIGHT CARPAL TUNNEL RELEASE;  Surgeon: Samuella Cota, MD;  Location: [facility];  Service: Orthopedics;  Laterality: Right;   CARPOMETACARPEL SUSPENSION PLASTY Right 03/26/2023   Procedure: RIGHT THUMB CARPOMETACARPEL (CMC)  ARTHROPLASTY WITH INTERNAL BRACE;  Surgeon: Samuella Cota, MD;  Location: [facility];  Service: Orthopedics;  Laterality: Right;   CHOLECYSTECTOMY     COLONOSCOPY     ENDOMETRIAL ABLATION  10/2010   HERNIA REPAIR     umbicial hernia   JOINT REPLACEMENT Left  03/14/2019   Dr. Magnus Ivan hip   KNEE ARTHROSCOPY Left    06/07/2017 Dr. August [facility]   KNEE ARTHROSCOPY Left 03/21/2020   Procedure: LEFT KNEE ARTHROSCOPY WITH PARTIAL MEDIAL MENISCECTOMY;  Surgeon: [name], MD;  Location: [facility];  Service: Orthopedics;  Laterality: Left;   KNEE CLOSED REDUCTION Right 12/06/2015   Procedure: CLOSED MANIPULATION RIGHT KNEE;  Surgeon: Kerrin Champagne, MD;  Location: [facility];  Service: Orthopedics;  Laterality: Right;   KNEE CLOSED REDUCTION Right 01/17/2016   Procedure: CLOSED MANIPULATION RIGHT KNEE;  Surgeon: Kerrin Champagne, MD;  Location: [facility];  Service: Orthopedics;  Laterality: Right;   KNEE JOINT MANIPULATION Right 12/06/2015   LACRIMAL TUBE INSERTION Bilateral 03/01/2019   Procedure: LACRIMAL TUBE INSERTION;  Surgeon: Aura Camps, MD;  Location: [facility];  Service: Ophthalmology;  Laterality: Bilateral;   LACRIMAL TUBE REMOVAL Bilateral 05/03/2019   Procedure: BILATERAL NASOLACRIMAL DUCT PROBING, IIRIGATION AND TUBE REMOVAL BOTH EYES;  Surgeon: Aura Camps, MD;  Location: [facility];  Service: Ophthalmology;  Laterality: Bilateral;   LUMBAR LAMINECTOMY/DECOMPRESSION MICRODISCECTOMY N/A 06/03/2015   Procedure: Bilateral lateral recess decompression L2-3, L3-4, L4-5;  Surgeon: Kerrin Champagne, MD;  Location[facility];  Service: Orthopedics;  Laterality: N/A;   LUMBAR LAMINECTOMY/DECOMPRESSION MICRODISCECTOMY N/A 10/02/2016   Procedure: Right L5-S1 Lateral Recess Decompression  microdiscectomy;  Surgeon: Kerrin Champagne, MD;  Location: [facility] OR;  Service: Orthopedics;  Laterality: N/A;   LUMBAR LAMINECTOMY/DECOMPRESSION MICRODISCECTOMY N/A 07/15/2018   Procedure: LEFT L3-4 MICRODISCECTOMY;  Surgeon: Kerrin Champagne, MD;  Location: [facility] OR;  Service: Orthopedics;  Laterality: N/A;   svd      x 2   TEAR DUCT PROBING Bilateral 03/01/2019   Procedure: TEAR DUCT PROBING WITH IRRIGATION;   Surgeon: Aura Camps, MD;  Location: [facility];  Service: Ophthalmology;  Laterality: Bilateral;   TOTAL HIP ARTHROPLASTY Left 03/14/2019   Procedure: LEFT TOTAL HIP ARTHROPLASTY ANTERIOR APPROACH;  Surgeon: [name], MD;  Location: [facility];  Service: Orthopedics;  Laterality: Left;   TOTAL KNEE ARTHROPLASTY Right 09/06/2015   Procedure: RIGHT TOTAL KNEE ARTHROPLASTY;  Surgeon: Kerrin Champagne, MD;  Location: [facility];  Service: Orthopedics;  Laterality: Right;   TOTAL KNEE REVISION Right 05/12/2022   Procedure: RIGHT TOTAL KNEE REVISION ARTHROPLASTY;  Surgeon: [name], MD;  Location: [facility];  Service: Orthopedics;  Laterality: Right;   TUBAL LIGATION     UPPER GASTROINTESTINAL ENDOSCOPY  04/28/2011   Patient Active Problem List   Diagnosis Date Noted   Arthritis of carpometacarpal [facility]) joint of right thumb 03/26/2023   Carpal tunnel syndrome, right upper limb 03/26/2023   Status post revision of total replacement of right knee 05/12/2022   History of colonic polyps 03/20/2022   Failed total knee, right, subsequent encounter 03/02/2022   Loose right total knee arthroplasty (HCC) 03/02/2022   Mixed stress and urge urinary incontinence 12/04/2021   Functional fecal incontinence 12/04/2021   IBS (irritable bowel syndrome) 11/19/2021   Spondylolisthesis, lumbar region    Other spondylosis with radiculopathy, lumbar region    Other secondary scoliosis, lumbar region    Fusion of spine of lumbar region 03/25/2021   Colon polyps 06/06/2020   Lupus 06/06/2020   Allergic rhinitis due to animal (cat) (dog) hair and dander 04/08/2020   Allergic rhinitis due to pollen 04/08/2020   Food allergy 04/08/2020   Acute medial meniscus tear, left, subsequent encounter 03/21/2020   Chronic pain of left knee 02/15/2020   Paresthesia of skin 11/16/2019   History of total knee replacement, right 11/16/2019   Tobacco abuse 11/16/2019   Centrilobular emphysema  (HCC) 08/10/2019   Incidental lung nodule, > 3mm and < 8mm 08/10/2019   OSA on CPAP 08/10/2019   Dyspnea on exertion 08/02/2019   Hyperlipidemia 08/02/2019   Lumbar radiculopathy 04/24/2019   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   Post laminectomy syndrome 02/23/2019   Abnormality of gait 02/23/2019   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   Unilateral primary osteoarthritis, left hip 12/28/2018   Lesion of skin of left ear 12/26/2018   Primary osteoarthritis of left hip 12/02/2018   Iron deficiency anemia 10/16/2018   Chronic pain syndrome 09/08/2018   Chronic pain of right knee 08/11/2018   Status post lumbar laminectomy 07/15/2018   Peripheral arterial disease (HCC) 04/05/2018   Moderate persistent asthma without complication 06/29/2017   Environmental and seasonal allergies 06/29/2017   Controlled type 2 diabetes mellitus with diabetic polyneuropathy, without long-term current use of insulin (HCC) 06/29/2017   Perennial allergic rhinitis 04/08/2017   Sensorineural hearing loss (SNHL), bilateral 04/08/2017   Chronic pansinusitis 03/25/2017  Eustachian tube dysfunction, bilateral 03/25/2017   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   Herniation of lumbar intervertebral disc with radiculopathy 10/02/2016    Class: Chronic   Alopecia areata 08/19/2016   Chondromalacia of both patellae 06/03/2015    Class: Chronic   Spinal stenosis, lumbar region, with neurogenic claudication 06/03/2015   Tobacco use disorder 04/25/2015   DJD (degenerative joint disease) of knee 01/04/2015   Hemorrhoid 11/14/2014   Gout of big toe 07/19/2014   Essential hypertension 08/14/2013   Gastroesophageal reflux disease without esophagitis 08/14/2013   COPD (chronic obstructive pulmonary disease) (HCC) 04/17/2011    ONSET DATE: 03/30/2023 (referral date), 03/26/23 (date of surgery, s/p R thumb CMC arthroplasty, R open carpal tunnel release)  REFERRING DIAG: M18.11 (ICD-10-CM) - Arthritis of carpometacarpal  (CMC) joint of right thumb  THERAPY DIAG:  Muscle weakness (generalized)  Other lack of coordination  Other disturbances of skin sensation  Pain in joint of right hand  Other symptoms and signs involving the musculoskeletal system  Rationale for Evaluation and Treatment: Rehabilitation  SUBJECTIVE:   SUBJECTIVE STATEMENT: Pt reported remaining steri strips fell off R palm. Pt reported exercises going well.   Pt accompanied by: self  PERTINENT HISTORY: 03/26/23 R thumb CMC arthroplasty and R open carpal tunnel release, s/p R TKA 2024, lupus, L hip total arthroplasty, tobacco abuse, hyperlipidemia, OSA on CPAP, essential HTN, COPD, GERD, HPV   03/30/23 Referral Notes:  Splint fabrication [facility] arthroplasty *Thursday  March 6th after 9 am  PRECAUTIONS: Per 5th Edition [place] Hand protocol, Fall, Other:    Per 03/26/23 OP Note: The patient will be non weight bearing on the right upper extremity in a spica splint. I will see the patient back in the office in 2 weeks for postoperative followup.   RED FLAGS: Hx of lupus   WEIGHT BEARING RESTRICTIONS: Yes   , per [facility] arthroplasty protocol   PAIN:  Are you having pain? Yes: NPRS scale: 5 out of 10 Pain location: at palm of affected hand Pain description: dull, "not too sharp" Aggravating factors: moving a certain way or sleeping with hand hanging down Relieving factors: rest, propping hand in bed  FALLS: Has patient fallen in last 6 months? Yes. Number of falls 1 L knee buckled on Friday last week at surgery center, pt reported no injuries, assistance from nurses  to get up  04/29/23 update - Pt reported L knee buckled and pt fell down to the ground. Pt denied injuries though required assistance from family to get up.   LIVING ENVIRONMENT: Lives with: lives with their spouse Lives in: House/apartment Stairs: Yes: Internal: 12 steps; none and External: 2 steps; on right going up and on left going up   PLOF: Independent with  basic ADLs, transportation service for community mobility  PATIENT GOALS: "improvement and mobility with the hand"  NEXT MD VISIT: 05/10/23  OBJECTIVE:  Note: Objective measures were completed at Evaluation unless otherwise noted.  HAND DOMINANCE: Left  ADLs: Eating: ind Grooming: denture care, some difficulty using one hand to replace/remove dentures Upper body dressing: ind with pullover shirt with extra time, some pain of affected UE Lower body dressing: ind with extra time, difficulty with zippers d/t using L  hand Toileting: ind Bathing: sponge bathing, difficulty d/t one hand, assistance from spouse to wash back Tub shower transfers: currently sponge bathing  FUNCTIONAL OUTCOME MEASURES: Quick Dash: 90.9% deficit     UPPER EXTREMITY ROM:     Active ROM Right eval Left eval  Shoulder flexion Unable to fully assess d/t presence of sling [facility]  Shoulder abduction Unable to fully assess d/t presence of sling   Shoulder adduction    Shoulder extension    Shoulder internal rotation    Shoulder external rotation    Elbow flexion WFL   Elbow extension [facility]   Wrist flexion    Wrist extension    Wrist ulnar deviation    Wrist radial deviation    Wrist pronation    Wrist supination    (Blank rows = not tested)  04/02/23 - Unable to assess ROM of affected hand d/t recent surgery and current precautions and presence of bulky compressive dressing.  Active ROM Right eval Left eval  Thumb MCP (0-60)      WFL                           Thumb IP (0-80)    Thumb Radial abd/add (0-55)    Thumb Palmar abd/add (0-45)    Thumb Opposition to Small Finger    Index MCP (0-90)    Index PIP (0-100)    Index DIP (0-70)     Long MCP (0-90)     Long PIP (0-100)     Long DIP (0-70)     Ring MCP (0-90)     Ring PIP (0-100)     Ring DIP (0-70)     Little MCP (0-90)     Little PIP (0-100)     Little DIP (0-70)     (Blank rows = not tested)  RUE 04/08/23 Wrist flex =  55*, ext = 45*  Thumb IP joint = 45* Shoulder, elbow, forearm and hand ROM WFL's  HAND FUNCTION: Grip strength: Unable to assess secondary to current precautions  COORDINATION: Unable to assess secondary to current precautions  SENSATION: Moderate tingling/numbness in B fingers  EDEMA: Mild in fingers, unable to assess full hand d/t presence of bulky compressive covering  COGNITION: Overall cognitive status: Within functional limits for tasks assessed  OBSERVATIONS: Pt was pleasant and appeared well-kept. Pt currently wearing sling of RUE with affected hand wrapped in Ace wrap as bulky compressive dressing.   TREATMENT DATE:                                                                                                               Manual  Scar massage over RUE full palm scar site. OT noted scar site appeared to be healing well, no s/s of infection. - for desensitization, to manage scar tissue, to reduce scar sensitivity. OT avoided scar massage to dorsal aspect of thumb scar site at this time to avoid pressure to [facility] joint. OT educated pt on scar massage purpose and technique. Pt acknowledged understanding. Handout provided, see pt instructions. Pt returned demo with mod v/c, especially to relax thumb.  TherEx OT completed RUE PROM MPJ with support at [facility] joint - 5 reps, 10 s holds - to improve affected UE ROM.  Pt benefited from  v/c to relax thumb.  OT completed RUE PROM IP with support at [facility] and MPJ joints - 5 reps, 10 s holds - to improve affected UE ROM. Pt benefited from v/c to relax thumb.  With hand-based orthosis donned: Wrist maze - to improve affected UE ROM. Pt benefited from rolled-up towel at medial side of elbow to prevent shoulder ABD. Pt returned demo. Pt denied pain during task.  Per pt request, Review of HEP - to improve carryover, to improve affected UE AROM. Wrist flex/ext on tabletop AROM - 10 reps - Pt benefited from b/c to relax thumb. Thumb IP AROM - 10  reps Thumb AROM flex/ext - 10 reps   PATIENT EDUCATION: Education details: see above  Person educated: Patient  Education method: Explanation, Demonstration, Verbal cues, and Handouts Education comprehension: verbalized understanding, returned demonstration, verbal cues required, and needs further education  HOME EXERCISE PROGRAM: 04/08/23: splint wear and care, hygiene care, precautions, initial A/ROM HEP for RUE (NOT for CMC/MP joint of thumb) 04/23/23 - thumb ROM, Access Code: [email or web address], splint wear schedule per protocol: hand-based splint during day, forearm-based at night.  04/26/23 - thumb palmar ADD (access code: same as above) 04/29/23 - RUE PROM thumb IP flex in splint, place-and-hold, hold 10 seconds, 10 reps, 4x per day  GOALS: Goals reviewed with patient? Yes  SHORT TERM GOALS: Target date: 04/23/23  Pt will obtain protective, custom forearm based thumb spica splint/orthotic and ind recall splint wear/care schedule. Baseline: Pt currently has compressive bulky dressing on RUE with sling. 04/26/23 - Pt ind recalled splint wear/care schedule. Goal status: MET  2.  Pt will demo understanding of initial RUE HEP to improve pain levels and prerequisite motion for functional use of her R hand/thumb. Baseline: new to outpt OT 04/26/23 - Pt continues to benefit from v/c for completion of HEP. 04/29/23 - Pt returned demo of initial RUE HPE with min to no v/c. Goal status: MET  3.  Pt will return demo of scar management techniques and edema management strategies. Baseline: new to outpt OT 04/29/23 - OT initiated scar massage HEP and pt returned demo.  05/03/23 - Pt returned demo of scar massage. Goal status: MET   LONG TERM GOALS: Target date: 05/14/23  Pt will be ind with updated RUE HEP Baseline: new to outpt OT Goal status: in progress  2.  Patient will demonstrate at least 16% improvement with quick Dash score (reporting 74.9% disability or less) indicating improved functional  use of affected extremity.  Baseline: 90.9% deficit  Goal status: in progress  3.  Pt will improve grip strength in R hand from unable to assess to at least 15 lbs for functional use at home and in ADLs/IADLs per precautions and protocol. Baseline: unable to assess secondary to current precautions Goal status: INITIAL  4.  Pt will improve A/ROM in R thumb to PIP (or greater) of R small finger, to have functional motion for tasks like ADL's, reach and grasp  Baseline: unable to assess AROM secondary to current precautions and presence of compressive bulky dressing. 04/26/23 - Pt demo'd thumb opposition to R small finger PIP joint. Goal status: MET  5.  Pt will improve R hand functional use to at least be Mod I for basic ADL's (ie, brushing her hair, bathing, brushing her teeth and folding laundry) to assist in ability to carry out self-care and higher-level home-care tasks with less difficulty. Baseline: unable to assess, compensating with L hand for all tasks d/t NWB RUE  Goal status: INITIAL  6. Patient will demo improved FM coordination as evidenced by completing nine-hole peg with use of RUE in 40 seconds or less.  Baseline: unable to assess secondary to current precautions and presence of bulky compressive dressing.  Goal Status: INITIAL  ASSESSMENT:  CLINICAL IMPRESSION: Pt met 1 STG today. Pt tolerated tasks well and demo'd some sensitivity of palmar scar site secondary to steri strips no longer present at palm scar site. Pt returned demo of scar massage techniques. Continued presence of steri strips over dorsal aspect of thumb scar site noted. Pt would benefit from skilled OT services in the outpatient setting to work on impairments as noted below to help pt return to PLOF as able.     PERFORMANCE DEFICITS: in functional skills including ADLs, IADLs, coordination, dexterity, proprioception, sensation, edema, ROM, strength, pain, Fine motor control, Gross motor control, mobility, body  mechanics, endurance, wound, skin integrity, and UE functional use, cognitive skills including  none , and psychosocial skills including environmental adaptation.   IMPAIRMENTS: are limiting patient from ADLs, IADLs, rest and sleep, leisure, and social participation.   COMORBIDITIES: may have co-morbidities  that affects occupational performance. Patient will benefit from skilled OT to address above impairments and improve overall function.  MODIFICATION OR ASSISTANCE TO COMPLETE EVALUATION: Min-Moderate modification of tasks or assist with assess necessary to complete an evaluation.  OT OCCUPATIONAL PROFILE AND HISTORY: Detailed assessment: Review of records and additional review of physical, cognitive, psychosocial history related to current functional performance.  CLINICAL DECISION MAKING: Moderate - several treatment options, min-mod task modification necessary  REHAB POTENTIAL: Good  EVALUATION COMPLEXITY: Moderate      PLAN:  OT FREQUENCY: 1-2x/week (1x per week for first week to fabricate splint, 2x per week for 6 weeks for remainder of POC)   OT DURATION: 7 weeks + eval  PLANNED INTERVENTIONS: 97168 OT Re-evaluation, 97535 self care/ADL training, 32440 therapeutic exercise, 97530 therapeutic activity, 97112 neuromuscular re-education, 97140 manual therapy, 97035 ultrasound, 97018 paraffin, 10272 fluidotherapy, 97010 moist heat, 97010 cryotherapy, 97760 Orthotics management and training, 53664 Splinting (initial encounter), [id number] Subsequent splinting/medication, passive range of motion, functional mobility training, energy conservation, patient/family education, and DME and/or AE instructions  RECOMMENDED OTHER SERVICES: N/A  CONSULTED AND AGREED WITH PLAN OF CARE: Patient  PLAN FOR NEXT SESSION:  OT to complete PROM MPJ with CMC supported Review HEP PRN Scar management  At 6 weeks s/p (on or after 05/07/23) - add isometric resistance for APB/opponens, begin weaning out of  orthosis during the day (remove for light ADLs 3-4 times per day <1 hour)     For all possible CPT codes, reference the Planned Interventions line above.     Check all conditions that are expected to impact treatment: {Conditions expected to impact treatment:Musculoskeletal disorders and Active major medical illness  (Lupus)  If treatment provided at initial evaluation, no treatment charged due to lack of authorization.        [name], OT 05/03/2023, 11:02 AM

## 2023-05-04 DIAGNOSIS — I10 Essential (primary) hypertension: Secondary | ICD-10-CM | POA: Diagnosis not present

## 2023-05-04 DIAGNOSIS — R159 Full incontinence of feces: Secondary | ICD-10-CM | POA: Diagnosis not present

## 2023-05-04 DIAGNOSIS — R32 Unspecified urinary incontinence: Secondary | ICD-10-CM | POA: Diagnosis not present

## 2023-05-05 ENCOUNTER — Ambulatory Visit (INDEPENDENT_AMBULATORY_CARE_PROVIDER_SITE_OTHER): Payer: Medicaid Other | Admitting: Podiatry

## 2023-05-05 ENCOUNTER — Encounter: Payer: Self-pay | Admitting: Podiatry

## 2023-05-05 VITALS — Ht 66.0 in | Wt 156.0 lb

## 2023-05-05 DIAGNOSIS — E119 Type 2 diabetes mellitus without complications: Secondary | ICD-10-CM

## 2023-05-05 DIAGNOSIS — E1142 Type 2 diabetes mellitus with diabetic polyneuropathy: Secondary | ICD-10-CM | POA: Diagnosis not present

## 2023-05-05 DIAGNOSIS — M2012 Hallux valgus (acquired), left foot: Secondary | ICD-10-CM

## 2023-05-05 DIAGNOSIS — M2011 Hallux valgus (acquired), right foot: Secondary | ICD-10-CM | POA: Diagnosis not present

## 2023-05-05 DIAGNOSIS — B351 Tinea unguium: Secondary | ICD-10-CM

## 2023-05-05 DIAGNOSIS — M79676 Pain in unspecified toe(s): Secondary | ICD-10-CM | POA: Diagnosis not present

## 2023-05-05 DIAGNOSIS — F331 Major depressive disorder, recurrent, moderate: Secondary | ICD-10-CM | POA: Diagnosis not present

## 2023-05-05 DIAGNOSIS — I739 Peripheral vascular disease, unspecified: Secondary | ICD-10-CM | POA: Diagnosis not present

## 2023-05-05 NOTE — Progress Notes (Signed)
 ANNUAL DIABETIC FOOT EXAM  Subjective: Stacy [name] presents today for annual diabetic foot exam.  Chief Complaint  Patient presents with   [facility]    I am here for a nail trim, PCP is dr Laural Benes and see her next week.    Patient confirms h/o diabetes.  Patient denies any h/o foot wounds.  Patient has been diagnosed with neuropathy.  Marcine Matar, MD is patient's PCP.  LOV 09/07/2022.  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   Asthma    COPD (chronic obstructive pulmonary disease) (HCC)    Depression    Diabetes mellitus    Type II   GERD (gastroesophageal reflux disease)    Pt on Protonix daily   Glaucoma    Gout    Headache(784.0)    otc meds prn   Hyperlipidemia    Hypertension    Irritable bowel syndrome 11/19/2010   Neuropathy    Pneumonia YRS AGO   Restless legs    Shortness of breath    07/14/2018- uses  4 times a day   Sleep apnea    Patient Active Problem List   Diagnosis Date Noted   Arthritis of [facility]) joint of right thumb 03/26/2023   Carpal tunnel syndrome, right upper limb 03/26/2023   Status post revision of total replacement of right knee 05/12/2022   History of colonic polyps 03/20/2022   Failed total knee, right, subsequent encounter 03/02/2022   Loose right total knee arthroplasty (HCC) 03/02/2022   Mixed stress and urge urinary incontinence 12/04/2021   Functional fecal incontinence 12/04/2021   IBS (irritable bowel syndrome) 11/19/2021   Spondylolisthesis, lumbar region    Other spondylosis with radiculopathy, lumbar region    Other secondary scoliosis, lumbar region    Fusion of spine of lumbar region 03/25/2021   Colon polyps 06/06/2020   Lupus 06/06/2020   Allergic rhinitis due to animal (cat) (dog) hair and dander 04/08/2020   Allergic rhinitis due to pollen 04/08/2020   Food  allergy 04/08/2020   Acute medial meniscus tear, left, subsequent encounter 03/21/2020   Chronic pain of left knee 02/15/2020   Paresthesia of skin 11/16/2019   History of total knee replacement, right 11/16/2019   Tobacco abuse 11/16/2019   Centrilobular emphysema (HCC) 08/10/2019   Incidental lung nodule, > 3mm and < 8mm 08/10/2019   OSA on CPAP 08/10/2019   Dyspnea on exertion 08/02/2019   Hyperlipidemia 08/02/2019   Lumbar radiculopathy 04/24/2019   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   Post laminectomy syndrome 02/23/2019   Abnormality of gait 02/23/2019   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   Unilateral primary osteoarthritis, left hip 12/28/2018   Lesion of skin of left ear 12/26/2018   Primary osteoarthritis of left hip 12/02/2018   Iron deficiency anemia 10/16/2018   Chronic pain syndrome 09/08/2018   Chronic pain of right knee 08/11/2018   Status post lumbar laminectomy 07/15/2018   Peripheral arterial disease (HCC) 04/05/2018   Moderate persistent asthma without complication 06/29/2017   Environmental and seasonal allergies 06/29/2017   Controlled type 2 diabetes mellitus with diabetic polyneuropathy, without long-term current use of insulin (HCC) 06/29/2017   Perennial allergic rhinitis 04/08/2017   Sensorineural hearing loss (SNHL), bilateral 04/08/2017   Chronic pansinusitis 03/25/2017   Eustachian tube dysfunction, bilateral 03/25/2017  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   Herniation of lumbar intervertebral disc with radiculopathy 10/02/2016    Class: Chronic   Alopecia areata 08/19/2016   Chondromalacia of both patellae 06/03/2015    Class: Chronic   Spinal stenosis, lumbar region, with neurogenic claudication 06/03/2015   Tobacco use disorder 04/25/2015   DJD (degenerative joint disease) of knee 01/04/2015   Hemorrhoid 11/14/2014   Gout of big toe 07/19/2014   Essential hypertension 08/14/2013   Gastroesophageal reflux disease without esophagitis  08/14/2013   COPD (chronic obstructive pulmonary disease) (HCC) 04/17/2011   Past Surgical History:  Procedure Laterality Date   BACK SURGERY     feb 21, 23   CARPAL TUNNEL RELEASE Right 03/26/2023   Procedure: RIGHT CARPAL TUNNEL RELEASE;  Surgeon: Samuella Cota, MD;  Location: [facility];  Service: Orthopedics;  Laterality: Right;   CARPOMETACARPEL SUSPENSION PLASTY Right 03/26/2023   Procedure: RIGHT THUMB CARPOMETACARPEL (CMC)  ARTHROPLASTY WITH INTERNAL BRACE;  Surgeon: Samuella Cota, MD;  Location: [facility];  Service: Orthopedics;  Laterality: Right;   CHOLECYSTECTOMY     COLONOSCOPY     ENDOMETRIAL ABLATION  10/2010   HERNIA REPAIR     umbicial hernia   JOINT REPLACEMENT Left 03/14/2019   Dr. Magnus Ivan hip   KNEE ARTHROSCOPY Left    06/07/2017 Dr. August [facility]   KNEE ARTHROSCOPY Left 03/21/2020   Procedure: LEFT KNEE ARTHROSCOPY WITH PARTIAL MEDIAL MENISCECTOMY;  Surgeon: [name], MD;  Location: [facility];  Service: Orthopedics;  Laterality: Left;   KNEE CLOSED REDUCTION Right 12/06/2015   Procedure: CLOSED MANIPULATION RIGHT KNEE;  Surgeon: Kerrin Champagne, MD;  Location: [facility];  Service: Orthopedics;  Laterality: Right;   KNEE CLOSED REDUCTION Right 01/17/2016   Procedure: CLOSED MANIPULATION RIGHT KNEE;  Surgeon: Kerrin Champagne, MD;  Location: [facility];  Service: Orthopedics;  Laterality: Right;   KNEE JOINT MANIPULATION Right 12/06/2015   LACRIMAL TUBE INSERTION Bilateral 03/01/2019   Procedure: LACRIMAL TUBE INSERTION;  Surgeon: Aura Camps, MD;  Location: [facility];  Service: Ophthalmology;  Laterality: Bilateral;   LACRIMAL TUBE REMOVAL Bilateral 05/03/2019   Procedure: BILATERAL NASOLACRIMAL DUCT PROBING, IIRIGATION AND TUBE REMOVAL BOTH EYES;  Surgeon: Aura Camps, MD;  Location: [facility];  Service: Ophthalmology;  Laterality: Bilateral;   LUMBAR  LAMINECTOMY/DECOMPRESSION MICRODISCECTOMY N/A 06/03/2015   Procedure: Bilateral lateral recess decompression L2-3, L3-4, L4-5;  Surgeon: Kerrin Champagne, MD;  Location: [facility];  Service: Orthopedics;  Laterality: N/A;   LUMBAR LAMINECTOMY/DECOMPRESSION MICRODISCECTOMY N/A 10/02/2016   Procedure: Right L5-S1 Lateral Recess Decompression  microdiscectomy;  Surgeon: Kerrin Champagne, MD;  Location: [facility];  Service: Orthopedics;  Laterality: N/A;   LUMBAR LAMINECTOMY/DECOMPRESSION MICRODISCECTOMY N/A 07/15/2018   Procedure: LEFT L3-4 MICRODISCECTOMY;  Surgeon: Kerrin Champagne, MD;  Location: [facility] OR;  Service: Orthopedics;  Laterality: N/A;   svd      x 2   TEAR DUCT PROBING Bilateral 03/01/2019   Procedure: TEAR DUCT PROBING WITH IRRIGATION;  Surgeon: Aura Camps, MD;  Location: [facility];  Service: Ophthalmology;  Laterality: Bilateral;   TOTAL HIP ARTHROPLASTY Left 03/14/2019   Procedure: LEFT TOTAL HIP ARTHROPLASTY ANTERIOR APPROACH;  Surgeon: [name], MD;  Location: [facility];  Service: Orthopedics;  Laterality: Left;   TOTAL KNEE ARTHROPLASTY Right 09/06/2015   Procedure: RIGHT TOTAL KNEE ARTHROPLASTY;  Surgeon: Kerrin Champagne, MD;  Location: [facility];  Service: Orthopedics;  Laterality: Right;  TOTAL KNEE REVISION Right 05/12/2022   Procedure: RIGHT TOTAL KNEE REVISION ARTHROPLASTY;  Surgeon: [name], MD;  Location: [facility];  Service: Orthopedics;  Laterality: Right;   TUBAL LIGATION     UPPER GASTROINTESTINAL ENDOSCOPY  04/28/2011   Current Outpatient Medications on File Prior to Visit  Medication Sig Dispense Refill   Accu-Chek Softclix Lancets lancets USE AS INSTRUCTED 3 TIMES A DAY 100 each 0   albuterol (PROVENTIL) (2.5 MG/3ML) 0.083% nebulizer solution USE ONE VIAL FOR UP TO 3 TIMES DAILY. 360 mL 0   allopurinol (ZYLOPRIM) 100 MG tablet TAKE 1 TABLET (100 MG TOTAL) BY MOUTH DAILY.(AM) 90 tablet 3   atorvastatin (LIPITOR) 40 MG tablet Take 1 tablet  (40 mg total) by mouth daily. 90 tablet 1   Blood Glucose Monitoring Suppl (ACCU-CHEK AVIVA PLUS) w/Device KIT 1 each by Does not apply route 3 (three) times daily. 1 kit 0   celecoxib (CELEBREX) 200 MG capsule Take 1 capsule (200 mg total) by mouth daily. 30 capsule 3   clopidogrel (PLAVIX) 75 MG tablet Take 1 tablet (75 mg total) by mouth daily. 90 tablet 1   diclofenac Sodium (VOLTAREN) 1 % GEL Apply 2 g topically 4 (four) times daily. 350 g 5   DULoxetine (CYMBALTA) 60 MG capsule TAKE 1 CAPSULE (60 MG TOTAL) BY MOUTH DAILY (AM) 30 capsule 6   EPINEPHrine 0.3 mg/0.3 mL IJ SOAJ injection 0.3 mg IM x 1 PRN for allergic reaction 1 each 1   FARXIGA 5 MG TABS tablet TAKE 1 TABLET (5 MG TOTAL) BY MOUTH DAILY BEFORE BREAKFAST (AM) 90 tablet 1   [name] 325 (65 Fe) MG tablet TAKE ONE TABLET ( 325 MG ) BY MOUTH DAILY (AM) 100 tablet 1   fluticasone (FLONASE) 50 MCG/ACT nasal spray PLACE 1 SPRAY INTO BOTH NOSTRILS DAILY. 16 g 6   Glucagon 1 MG/0.2ML SOSY 1 mg arrange subcutaneously as needed for severe symptomatic low blood sugar 0.2 mL 0   glucose blood (ACCU-CHEK AVIVA PLUS) test strip USE AS INSTRUCTED 3 TIMES A DAY 100 each 0   glycopyrrolate (ROBINUL) 2 MG tablet Take 2 mg by mouth 3 (three) times daily.     hydrOXYzine (ATARAX) 10 MG tablet TAKE 1 TABLET IN THE MORNING AND NOON AND 2 TABLETS IN THE EVENING AS NEEDED. 120 tablet 1   Lancets (ACCU-CHEK SOFT TOUCH) lancets Use as instructed 100 each 12   loratadine (CLARITIN) 10 MG tablet TAKE 1 TABLET (10 MG TOTAL) BY MOUTH DAILY AS NEEDED FOR ALLERGIES. (AM) 90 tablet 1   melatonin 5 MG TABS Take 5 mg by mouth at bedtime.     metFORMIN (GLUCOPHAGE-XR) 750 MG 24 hr tablet Take 1 tablet (750 mg total) by mouth daily with breakfast. 90 tablet 1   methocarbamol (ROBAXIN) 500 MG tablet TAKE 1 TABLET (500 MG TOTAL) BY MOUTH EVERY 6 (SIX) HOURS AS NEEDED FOR MUSCLE SPASMS. 40 tablet 1   Methylnaltrexone Bromide 8 MG/0.4ML SOLN Take 0.4 mLs by mouth as  needed. Take once daily as needed 2.1 mL 0   montelukast (SINGULAIR) 10 MG tablet Take 10 mg by mouth daily.     Multiple Vitamins-Minerals (CENTRUM SILVER 50+WOMEN PO) Take 1 tablet by mouth daily.     oxyCODONE (ROXICODONE) 5 MG immediate release tablet Take 1 tablet (5 mg total) by mouth every 6 (six) hours as needed for severe pain (pain score 7-10). 30 tablet 0   pantoprazole (PROTONIX) 40 MG tablet Take 40 mg  by mouth daily.     potassium chloride SA (KLOR-CON M) 20 MEQ tablet TAKE 1 TABLET (20 MEQ TOTAL) BY MOUTH DAILY.(NOON ) 90 tablet 1   Respiratory Therapy Supplies (FLUTTER) DEVI Use after breathing treatment 4 times daily 1 each 0   SPIRIVA HANDIHALER 18 MCG inhalation capsule Place 1 capsule (18 mcg total) into inhaler and inhale daily. 90 capsule 0   SYMBICORT 160-4.5 MCG/ACT inhaler Inhale 1 puff into the lungs 2 (two) times daily.     triamcinolone cream (KENALOG) 0.1 % Apply 1 Application topically 2 (two) times daily as needed (irritation). 45 g 1   valsartan-hydrochlorothiazide (DIOVAN-HCT) 160-12.5 MG tablet TAKE ONE TABLET BY MOUTH ONCE DAILY (AM) 90 tablet 1   [DISCONTINUED] Fluticasone-Salmeterol (ADVAIR) 500-50 MCG/DOSE AEPB Inhale 1 puff into the lungs every 12 (twelve) hours.       [DISCONTINUED] lisinopril (PRINIVIL,ZESTRIL) 40 MG tablet Take 40 mg by mouth daily.       Current Facility-Administered Medications on File Prior to Visit  Medication Dose Route Frequency Provider Last Rate Last Admin   glucose chewable tablet 12 g  3 tablet Oral Once Marcine Matar, MD        Allergies  Allergen Reactions   Other Shortness Of Breath    UNSPECIFIED AGENTS Allergic to perfumes and cleaning products   Shellfish Allergy Anaphylaxis    Per allergy test.   Ace Inhibitors Cough and Other (See Comments)        Celebrex [Celecoxib]      upset stomach  Pt is taking med*   Shellfish-Derived Products Other (See Comments)   Aspirin Nausea Only and Other (See Comments)     stomach upset  Other reaction(s): Nausea Only   Social History   Occupational History   Occupation: unemployed    Associate Professor: UNEMPLOYED  Tobacco Use   Smoking status: Former   Smokeless tobacco: Never  Advertising account planner   Vaping status: Never Used  Substance and Sexual Activity   Alcohol use: No   Drug use: No   Sexual activity: Yes    Birth control/protection: Surgical, Post-menopausal    Comment: tubal ligation   Family History  Problem Relation Age of Onset   Hypertension Father    Cancer Father    Heart disease Mother    Asthma Son        had as a child   Heart disease Sister    Breast cancer Sister    Hypertension Brother    Immunization History  Administered Date(s) Administered   Influenza Inj Mdck Quad Pf 11/07/2018   Influenza Split 11/21/2014   Influenza Whole 02/03/2011   Influenza, Seasonal, Injecte, Preservative Fre 10/16/2022   Influenza,inj,Quad PF,6+ Mos 01/01/2014, 10/25/2014, 10/24/2015, 11/24/2015, 11/23/2016, 11/11/2017, 11/07/2018, 11/22/2019, 11/20/2020, 10/23/2021   PFIZER(Purple Top)SARS-COV-2 Vaccination 04/20/2019, 05/16/2019, 12/02/2019, 02/13/2020   PNEUMOCOCCAL CONJUGATE-20 12/29/2022   Pneumococcal Conjugate (Pcv15) 01/20/2021   Pneumococcal Polysaccharide-23 06/04/2015   Tdap 09/07/2011, 01/19/2022   Unspecified SARS-COV-2 Vaccination 12/29/2022   Zoster Recombinant(Shingrix) 09/02/2021, 01/05/2022     Review of Systems: Negative except as noted in the HPI.   Objective: There were no vitals filed for this visit.  Stacy [name] is a pleasant 63 y.o. female in NAD. AAO X 3.  Diabetic foot exam was performed with the following findings:   Vascular Examination: Capillary refill time immediate b/l. Vascular status intact b/l with palpable DP pulses; nonpalpable PT pulses. Pedal hair absent b/l. No edema. No pain with calf compression b/l. Skin  temperature gradient WNL b/l. No cyanosis or clubbing noted b/l. No ischemia or gangrene  b/l.   Neurological Examination: Pt has subjective symptoms of neuropathy.Sensation grossly intact b/l with 10 gram monofilament. Vibratory sensation intact b/l.   Dermatological Examination: Pedal skin with normal turgor, texture and tone b/l.  No open wounds. No interdigital macerations.   Toenails 1-5 b/l thick, discolored, elongated with subungual debris and pain on dorsal palpation.   No hyperkeratotic nor porokeratotic lesions present on today's visit.  Musculoskeletal Examination: Muscle strength 5/5 to all lower extremity muscle groups bilaterally. HAV with bunion deformity noted b/l LE. Hammertoe deformity noted 2-5 b/l.  Radiographs: None     Lab Results  Component Value Date   HGBA1C 6.8 01/08/2023   ADA Risk Categorization: High Risk  Patient has one or more of the following: Loss of protective sensation Absent pedal pulses Severe Foot deformity History of foot ulcer  Assessment: 1. Pain due to onychomycosis of toenail   2. Hallux valgus, acquired, bilateral   3. PAD (peripheral artery disease) (HCC)   4. Diabetic peripheral neuropathy associated with type 2 diabetes mellitus (HCC)   5. Encounter for diabetic foot exam (HCC)     Plan: Diabetic foot examination performed today. All patient's and/or POA's questions/concerns addressed on today's visit. Toenails 1-5 debrided in length and girth without incident. Continue foot and shoe inspections daily. Monitor blood glucose per PCP/Endocrinologist's recommendations. Continue soft, supportive shoe gear daily. Report any pedal injuries to medical professional. Call office if there are any questions/concerns. -Patient/POA to call should there be question/concern in the interim. No follow-ups on file.  [name], DPM      [address] LOCATION: [address], [place] [address]                   Office [phone]   [facility] LOCATION: [address] [place], [place] [address] Office [phone]

## 2023-05-07 ENCOUNTER — Ambulatory Visit: Payer: Medicaid Other | Attending: Orthopedic Surgery | Admitting: Occupational Therapy

## 2023-05-07 DIAGNOSIS — M6281 Muscle weakness (generalized): Secondary | ICD-10-CM | POA: Insufficient documentation

## 2023-05-07 DIAGNOSIS — R278 Other lack of coordination: Secondary | ICD-10-CM | POA: Insufficient documentation

## 2023-05-07 DIAGNOSIS — M25541 Pain in joints of right hand: Secondary | ICD-10-CM | POA: Insufficient documentation

## 2023-05-07 DIAGNOSIS — R208 Other disturbances of skin sensation: Secondary | ICD-10-CM | POA: Diagnosis present

## 2023-05-07 DIAGNOSIS — R29898 Other symptoms and signs involving the musculoskeletal system: Secondary | ICD-10-CM | POA: Diagnosis present

## 2023-05-07 NOTE — Therapy (Addendum)
 OUTPATIENT OCCUPATIONAL THERAPY ORTHO TREATMENT  Patient Name: Stacy [name] [facility] MRN: [id number] DOB:09/07/1960, 63 y.o., female Today's Date: 05/07/2023  PCP: Marcine Matar, MD  REFERRING PROVIDER: Samuella Cota, MD   END OF SESSION:  OT End of Session - 05/07/23 1116     Visit Number 9    Number of Visits 14   including eval   Date for OT Re-Evaluation 05/14/23    Authorization Type [employer]    OT Start Time 1019    OT Stop Time 1058    OT Time Calculation (min) 39 min    Activity Tolerance Patient tolerated treatment well    Behavior During Therapy WFL for tasks assessed/performed                    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   Asthma    COPD (chronic obstructive pulmonary disease) (HCC)    Depression    Diabetes mellitus    Type II   GERD (gastroesophageal reflux disease)    Pt on Protonix daily   Glaucoma    Gout    Headache(784.0)    otc meds prn   Hyperlipidemia    Hypertension    Irritable bowel syndrome 11/19/2010   Neuropathy    Pneumonia YRS AGO   Restless legs    Shortness of breath    07/14/2018- uses  4 times a day   Sleep apnea    Past Surgical History:  Procedure Laterality Date   BACK SURGERY     feb 21, 23   CARPAL TUNNEL RELEASE Right 03/26/2023   Procedure: RIGHT CARPAL TUNNEL RELEASE;  Surgeon: Samuella Cota, MD;  Location: [facility];  Service: Orthopedics;  Laterality: Right;   CARPOMETACARPEL SUSPENSION PLASTY Right 03/26/2023   Procedure: RIGHT THUMB CARPOMETACARPEL (CMC)  ARTHROPLASTY WITH INTERNAL BRACE;  Surgeon: Samuella Cota, MD;  Location: [facility];  Service: Orthopedics;  Laterality: Right;   CHOLECYSTECTOMY     COLONOSCOPY     ENDOMETRIAL ABLATION  10/2010   HERNIA REPAIR     umbicial hernia   JOINT REPLACEMENT Left  03/14/2019   Dr. Magnus Ivan hip   KNEE ARTHROSCOPY Left    06/07/2017 Dr. August [facility]   KNEE ARTHROSCOPY Left 03/21/2020   Procedure: LEFT KNEE ARTHROSCOPY WITH PARTIAL MEDIAL MENISCECTOMY;  Surgeon: [name], MD;  Location: [facility];  Service: Orthopedics;  Laterality: Left;   KNEE CLOSED REDUCTION Right 12/06/2015   Procedure: CLOSED MANIPULATION RIGHT KNEE;  Surgeon: Kerrin Champagne, MD;  Location: [facility];  Service: Orthopedics;  Laterality: Right;   KNEE CLOSED REDUCTION Right 01/17/2016   Procedure: CLOSED MANIPULATION RIGHT KNEE;  Surgeon: Kerrin Champagne, MD;  Location: [facility];  Service: Orthopedics;  Laterality: Right;   KNEE JOINT MANIPULATION Right 12/06/2015   LACRIMAL TUBE INSERTION Bilateral 03/01/2019   Procedure: LACRIMAL TUBE INSERTION;  Surgeon: Aura Camps, MD;  Location: [facility];  Service: Ophthalmology;  Laterality: Bilateral;   LACRIMAL TUBE REMOVAL Bilateral 05/03/2019   Procedure: BILATERAL NASOLACRIMAL DUCT PROBING, IIRIGATION AND TUBE REMOVAL BOTH EYES;  Surgeon: Aura Camps, MD;  Location: [facility];  Service: Ophthalmology;  Laterality: Bilateral;   LUMBAR LAMINECTOMY/DECOMPRESSION MICRODISCECTOMY N/A 06/03/2015   Procedure: Bilateral lateral recess decompression L2-3, L3-4,  L4-5;  Surgeon: Kerrin Champagne, MD;  [facility];  Service: Orthopedics;  Laterality: N/A;   LUMBAR LAMINECTOMY/DECOMPRESSION MICRODISCECTOMY N/A 10/02/2016   Procedure: Right L5-S1 Lateral Recess Decompression  microdiscectomy;  Surgeon: Kerrin Champagne, MD;  Location: [facility] OR;  Service: Orthopedics;  Laterality: N/A;   LUMBAR LAMINECTOMY/DECOMPRESSION MICRODISCECTOMY N/A 07/15/2018   Procedure: LEFT L3-4 MICRODISCECTOMY;  Surgeon: Kerrin Champagne, MD;  Location: [facility] OR;  Service: Orthopedics;  Laterality: N/A;   svd      x 2   TEAR DUCT PROBING Bilateral 03/01/2019   Procedure: TEAR DUCT PROBING WITH IRRIGATION;   Surgeon: Aura Camps, MD;  Location: [facility];  Service: Ophthalmology;  Laterality: Bilateral;   TOTAL HIP ARTHROPLASTY Left 03/14/2019   Procedure: LEFT TOTAL HIP ARTHROPLASTY ANTERIOR APPROACH;  Surgeon: [name], MD;  Location: [facility];  Service: Orthopedics;  Laterality: Left;   TOTAL KNEE ARTHROPLASTY Right 09/06/2015   Procedure: RIGHT TOTAL KNEE ARTHROPLASTY;  Surgeon: Kerrin Champagne, MD;  Location: [facility];  Service: Orthopedics;  Laterality: Right;   TOTAL KNEE REVISION Right 05/12/2022   Procedure: RIGHT TOTAL KNEE REVISION ARTHROPLASTY;  Surgeon: [name], MD;  Location: [facility];  Service: Orthopedics;  Laterality: Right;   TUBAL LIGATION     UPPER GASTROINTESTINAL ENDOSCOPY  04/28/2011   Patient Active Problem List   Diagnosis Date Noted   Arthritis of carpometacarpal [facility]) joint of right thumb 03/26/2023   Carpal tunnel syndrome, right upper limb 03/26/2023   Status post revision of total replacement of right knee 05/12/2022   History of colonic polyps 03/20/2022   Failed total knee, right, subsequent encounter 03/02/2022   Loose right total knee arthroplasty (HCC) 03/02/2022   Mixed stress and urge urinary incontinence 12/04/2021   Functional fecal incontinence 12/04/2021   IBS (irritable bowel syndrome) 11/19/2021   Spondylolisthesis, lumbar region    Other spondylosis with radiculopathy, lumbar region    Other secondary scoliosis, lumbar region    Fusion of spine of lumbar region 03/25/2021   Colon polyps 06/06/2020   Lupus 06/06/2020   Allergic rhinitis due to animal (cat) (dog) hair and dander 04/08/2020   Allergic rhinitis due to pollen 04/08/2020   Food allergy 04/08/2020   Acute medial meniscus tear, left, subsequent encounter 03/21/2020   Chronic pain of left knee 02/15/2020   Paresthesia of skin 11/16/2019   History of total knee replacement, right 11/16/2019   Tobacco abuse 11/16/2019   Centrilobular emphysema  (HCC) 08/10/2019   Incidental lung nodule, > 3mm and < 8mm 08/10/2019   OSA on CPAP 08/10/2019   Dyspnea on exertion 08/02/2019   Hyperlipidemia 08/02/2019   Lumbar radiculopathy 04/24/2019   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   Post laminectomy syndrome 02/23/2019   Abnormality of gait 02/23/2019   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   Unilateral primary osteoarthritis, left hip 12/28/2018   Lesion of skin of left ear 12/26/2018   Primary osteoarthritis of left hip 12/02/2018   Iron deficiency anemia 10/16/2018   Chronic pain syndrome 09/08/2018   Chronic pain of right knee 08/11/2018   Status post lumbar laminectomy 07/15/2018   Peripheral arterial disease (HCC) 04/05/2018   Moderate persistent asthma without complication 06/29/2017   Environmental and seasonal allergies 06/29/2017   Controlled type 2 diabetes mellitus with diabetic polyneuropathy, without long-term current use of insulin (HCC) 06/29/2017   Perennial allergic rhinitis 04/08/2017   Sensorineural hearing loss (SNHL), bilateral 04/08/2017   Chronic pansinusitis  03/25/2017   Eustachian tube dysfunction, bilateral 03/25/2017   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   Herniation of lumbar intervertebral disc with radiculopathy 10/02/2016    Class: Chronic   Alopecia areata 08/19/2016   Chondromalacia of both patellae 06/03/2015    Class: Chronic   Spinal stenosis, lumbar region, with neurogenic claudication 06/03/2015   Tobacco use disorder 04/25/2015   DJD (degenerative joint disease) of knee 01/04/2015   Hemorrhoid 11/14/2014   Gout of big toe 07/19/2014   Essential hypertension 08/14/2013   Gastroesophageal reflux disease without esophagitis 08/14/2013   COPD (chronic obstructive pulmonary disease) (HCC) 04/17/2011    ONSET DATE: 03/30/2023 (referral date), 03/26/23 (date of surgery, s/p R thumb CMC arthroplasty, R open carpal tunnel release)  REFERRING DIAG: M18.11 (ICD-10-CM) - Arthritis of carpometacarpal  (CMC) joint of right thumb  THERAPY DIAG:  Muscle weakness (generalized)  Other lack of coordination  Other disturbances of skin sensation  Pain in joint of right hand  Other symptoms and signs involving the musculoskeletal system  Rationale for Evaluation and Treatment: Rehabilitation  SUBJECTIVE:   SUBJECTIVE STATEMENT: Pt reported upcoming appointment with Dr. Fara Boros and PCP on Monday 05/10/23. Pt reported remaining Steri strips fell off. Pt reported completing daily scar massage.   Pt accompanied by: self  PERTINENT HISTORY: 03/26/23 R thumb CMC arthroplasty and R open carpal tunnel release, s/p R TKA 2024, lupus, L hip total arthroplasty, tobacco abuse, hyperlipidemia, OSA on CPAP, essential HTN, COPD, GERD, HPV   03/30/23 Referral Notes:  Splint fabrication [facility] arthroplasty *Thursday  March 6th after 9 am  PRECAUTIONS: Per 5th Edition [place] [facility] protocol, Fall, Other:    Per 03/26/23 OP Note: The patient will be non weight bearing on the right upper extremity in a spica splint. I will see the patient back in the office in 2 weeks for postoperative followup.   RED FLAGS: Hx of lupus   WEIGHT BEARING RESTRICTIONS: Yes   , per [facility] arthroplasty protocol   PAIN:  Are you having pain? Yes: NPRS scale: 3 out of 10 Pain location: at palm of affected hand Pain description: dull Aggravating factors: moving a certain way or sleeping with hand hanging down Relieving factors: rest, propping hand in bed  FALLS: Has patient fallen in last 6 months? Yes. Number of falls 1 L knee buckled on Friday last week at surgery center, pt reported no injuries, assistance from nurses  to get up  04/29/23 update - Pt reported L knee buckled and pt fell down to the ground. Pt denied injuries though required assistance from family to get up.   LIVING ENVIRONMENT: Lives with: lives with their spouse Lives in: House/apartment Stairs: Yes: Internal: 12 steps; none and External: 2 steps; on right  going up and on left going up   PLOF: Independent with basic ADLs, transportation service for community mobility  PATIENT GOALS: "improvement and mobility with the hand"  NEXT MD VISIT: 05/10/23  OBJECTIVE:  Note: Objective measures were completed at Evaluation unless otherwise noted.  HAND DOMINANCE: Left  ADLs: Eating: ind Grooming: denture care, some difficulty using one hand to replace/remove dentures Upper body dressing: ind with pullover shirt with extra time, some pain of affected UE Lower body dressing: ind with extra time, difficulty with zippers d/t using L  hand Toileting: ind Bathing: sponge bathing, difficulty d/t one hand, assistance from spouse to wash back Tub shower transfers: currently sponge bathing  FUNCTIONAL OUTCOME MEASURES: Quick Dash: 90.9% deficit     05/07/23 -  43.2% deficit   UPPER EXTREMITY ROM:     Active ROM Right eval Left eval  Shoulder flexion Unable to fully assess d/t presence of sling [facility]  Shoulder abduction Unable to fully assess d/t presence of sling   Shoulder adduction    Shoulder extension    Shoulder internal rotation    Shoulder external rotation    Elbow flexion WFL   Elbow extension WFL   Wrist flexion    Wrist extension    Wrist ulnar deviation    Wrist radial deviation    Wrist pronation    Wrist supination    (Blank rows = not tested)  04/02/23 - Unable to assess ROM of affected hand d/t recent surgery and current precautions and presence of bulky compressive dressing.  Active ROM Right eval Left eval  Thumb MCP (0-60)      WFL                           Thumb IP (0-80)    Thumb Radial abd/add (0-55)    Thumb Palmar abd/add (0-45)    Thumb Opposition to Small Finger    Index MCP (0-90)    Index PIP (0-100)    Index DIP (0-70)     Long MCP (0-90)     Long PIP (0-100)     Long DIP (0-70)     Ring MCP (0-90)     Ring PIP (0-100)     Ring DIP (0-70)     Little MCP (0-90)     Little PIP (0-100)      Little DIP (0-70)     (Blank rows = not tested)  RUE 04/08/23 Wrist flex = 55*, ext = 45*  Thumb IP joint = 45* Shoulder, elbow, forearm and hand ROM WFL's  HAND FUNCTION: Grip strength: Unable to assess secondary to current precautions  COORDINATION: Eval: Unable to assess secondary to current precautions  05/07/23 - 9-hole peg test: LUE - 30 seconds, RUE - 41 seconds  SENSATION: Moderate tingling/numbness in B fingers  EDEMA: Mild in fingers, unable to assess full hand d/t presence of bulky compressive covering  COGNITION: Overall cognitive status: Within functional limits for tasks assessed  OBSERVATIONS: Pt was pleasant and appeared well-kept. Pt currently wearing sling of RUE with affected hand wrapped in Ace wrap as bulky compressive dressing.   TREATMENT DATE:                                                                                                               Self-Care OT educated pt on updated splint wear schedule. Handout provided, see pt instructions. OT educated pt on examples of "light activities" around the home and advised pt to continue to avoid strengthening per current precautions. Pt verbalized understanding.  OT educated pt on heat home program options and purpose of heat application. Pt verbalized understanding.  OT reviewed education regarding Scar massage over RUE full palm scar site only and pt returned demo. OT assessed  dorsal R thumb scar site and noted no steri strips present, tightness of scar, scar site appeared to be healing well with no s/s of infection. Pt reported numbness over dorsal R thumb scar site. Scar massage at R dorsal thumb scar site not completed today d/t time constraints.   TherEx Pt placed BUE in Fluidotherapy machine with supervised ROM x 10 min. Pt was educated to complete RUE AROM (tendon glides, thumb circumduction and flex/ext, wrist circumduction, wrist flex/ext) during modality time to improve ROM, decrease  pain/stiffness of affected extremity, and for desensitization of scar sites by use of the machine's massaging action and thermal properties.    TherAct OT assessed pt's progress towards goals, see below for updates.   PATIENT EDUCATION: Education details: see above  Person educated: Patient  Education method: Solicitor, Verbal cues, and Handouts Education comprehension: verbalized understanding, returned demonstration, verbal cues required, and needs further education  HOME EXERCISE PROGRAM: 04/08/23: splint wear and care, hygiene care, precautions, initial A/ROM HEP for RUE (NOT for CMC/MP joint of thumb) 04/23/23 - thumb ROM, Access Code: [email or web address], splint wear schedule per protocol: hand-based splint during day, forearm-based at night.  04/26/23 - thumb palmar ADD (access code: same as above) 04/29/23 - RUE PROM thumb IP flex in splint, place-and-hold, hold 10 seconds, 10 reps, 4x per day  GOALS: Goals reviewed with patient? Yes  SHORT TERM GOALS: Target date: 04/23/23  Pt will obtain protective, custom forearm based thumb spica splint/orthotic and ind recall splint wear/care schedule. Baseline: Pt currently has compressive bulky dressing on RUE with sling. 04/26/23 - Pt ind recalled splint wear/care schedule. Goal status: MET  2.  Pt will demo understanding of initial RUE HEP to improve pain levels and prerequisite motion for functional use of her R hand/thumb. Baseline: new to outpt OT 04/26/23 - Pt continues to benefit from v/c for completion of HEP. 04/29/23 - Pt returned demo of initial RUE HPE with min to no v/c. Goal status: MET  3.  Pt will return demo of scar management techniques and edema management strategies. Baseline: new to outpt OT 04/29/23 - OT initiated scar massage HEP and pt returned demo.  05/03/23 - Pt returned demo of scar massage. Goal status: MET   LONG TERM GOALS: Target date: 05/14/23  Pt will be ind with updated RUE HEP Baseline: new to  outpt OT Goal status: in progress  2.  Patient will demonstrate at least 16% improvement with quick Dash score (reporting  27.3% disability or less) indicating improved functional use of affected extremity.  Baseline: 90.9% deficit  05/07/23 - 05/07/23 - 43.2% deficit Goal status: MET and revised on 05/07/23  3.  Pt will improve grip strength in R hand from unable to assess to at least 15 lbs for functional use at home and in ADLs/IADLs per precautions and protocol. Baseline: unable to assess secondary to current precautions Goal status: INITIAL  4.  Pt will improve A/ROM in R thumb to PIP (or greater) of R small finger, to have functional motion for tasks like ADL's, reach and grasp  Baseline: unable to assess AROM secondary to current precautions and presence of compressive bulky dressing. 04/26/23 - Pt demo'd thumb opposition to R small finger PIP joint. Goal status: MET  5.  Pt will improve R hand functional use to at least be Mod I for basic ADL's (ie, brushing her hair, bathing, brushing her teeth and folding laundry) to assist in ability to carry out self-care and higher-level home-care tasks  with less difficulty. Baseline: unable to assess, compensating with L hand for all tasks d/t NWB RUE 05/07/23 - Pt beginning to use R hand for light tasks while wearing splint, such as folding laundry. Goal status: in progress  6. Patient will demo improved FM coordination as evidenced by completing nine-hole peg with use of RUE in 40 seconds or less.  Baseline: unable to assess secondary to current precautions and presence of bulky compressive dressing. 05/07/23 - 9-hole peg test: LUE - 30 seconds, RUE - 41 seconds  Goal Status: in progress  ASSESSMENT:  CLINICAL IMPRESSION: Pt met 2 LTG today and completed initial 9-hole peg testing with affected hand, indicating good progress towards goals and improved functional use of affected UE. Pt tolerated tasks well. Pt to begin weaning splint. OT to monitor  as pt begins to return to light activities with affected hand. Pt has upcoming appointment with hand surgeon on 05/10/23. Pt would benefit from skilled OT services in the outpatient setting to work on impairments as noted below to help pt return to PLOF as able.     PERFORMANCE DEFICITS: in functional skills including ADLs, IADLs, coordination, dexterity, proprioception, sensation, edema, ROM, strength, pain, Fine motor control, Gross motor control, mobility, body mechanics, endurance, wound, skin integrity, and UE functional use, cognitive skills including  none , and psychosocial skills including environmental adaptation.   IMPAIRMENTS: are limiting patient from ADLs, IADLs, rest and sleep, leisure, and social participation.   COMORBIDITIES: may have co-morbidities  that affects occupational performance. Patient will benefit from skilled OT to address above impairments and improve overall function.  MODIFICATION OR ASSISTANCE TO COMPLETE EVALUATION: Min-Moderate modification of tasks or assist with assess necessary to complete an evaluation.  OT OCCUPATIONAL PROFILE AND HISTORY: Detailed assessment: Review of records and additional review of physical, cognitive, psychosocial history related to current functional performance.  CLINICAL DECISION MAKING: Moderate - several treatment options, min-mod task modification necessary  REHAB POTENTIAL: Good  EVALUATION COMPLEXITY: Moderate      PLAN:  OT FREQUENCY: 1-2x/week (1x per week for first week to fabricate splint, 2x per week for 6 weeks for remainder of POC)   OT DURATION: 7 weeks + eval  PLANNED INTERVENTIONS: 97168 OT Re-evaluation, 97535 self care/ADL training, 16109 therapeutic exercise, 97530 therapeutic activity, 97112 neuromuscular re-education, 97140 manual therapy, 97035 ultrasound, 97018 paraffin, 60454 fluidotherapy, 97010 moist heat, 97010 cryotherapy, 97760 Orthotics management and training, 09811 Splinting (initial  encounter), [id number] Subsequent splinting/medication, passive range of motion, functional mobility training, energy conservation, patient/family education, and DME and/or AE instructions  RECOMMENDED OTHER SERVICES: N/A  CONSULTED AND AGREED WITH PLAN OF CARE: Patient  PLAN FOR NEXT SESSION:  Discuss with pt: likely re-cert (POC ending 05/14/23) Updates from 05/10/23 appointment at Dr. Jamie Kato office?  OT to complete PROM MPJ with [facility] supported Review HEP PRN Scar management - begin to include R dorsal thumb scar site  Add isometric resistance for APB/opponens Monitor weaning out of orthosis during the day (remove for light ADLs 3-4 times per day <1 hour) - how is it going?     For all possible CPT codes, reference the Planned Interventions line above.     Check all conditions that are expected to impact treatment: {Conditions expected to impact treatment:Musculoskeletal disorders and Active major medical illness  (Lupus)  If treatment provided at initial evaluation, no treatment charged due to lack of authorization.        [name], OT 05/07/2023, 11:31 AM

## 2023-05-07 NOTE — Patient Instructions (Signed)
 New Splint Wear Schedule  May begin to remove splint for light activity around the home. Approximately 30-60 minutes, 3-4x per day.

## 2023-05-10 ENCOUNTER — Ambulatory Visit: Payer: Medicaid Other | Attending: Internal Medicine | Admitting: Internal Medicine

## 2023-05-10 ENCOUNTER — Ambulatory Visit (INDEPENDENT_AMBULATORY_CARE_PROVIDER_SITE_OTHER): Admitting: Orthopedic Surgery

## 2023-05-10 ENCOUNTER — Encounter: Payer: Self-pay | Admitting: Internal Medicine

## 2023-05-10 VITALS — BP 116/71 | HR 94 | Temp 99.0°F | Ht 66.0 in | Wt 164.0 lb

## 2023-05-10 DIAGNOSIS — Z7984 Long term (current) use of oral hypoglycemic drugs: Secondary | ICD-10-CM

## 2023-05-10 DIAGNOSIS — E1142 Type 2 diabetes mellitus with diabetic polyneuropathy: Secondary | ICD-10-CM | POA: Diagnosis not present

## 2023-05-10 DIAGNOSIS — I152 Hypertension secondary to endocrine disorders: Secondary | ICD-10-CM

## 2023-05-10 DIAGNOSIS — I10 Essential (primary) hypertension: Secondary | ICD-10-CM

## 2023-05-10 DIAGNOSIS — I739 Peripheral vascular disease, unspecified: Secondary | ICD-10-CM | POA: Diagnosis not present

## 2023-05-10 DIAGNOSIS — E119 Type 2 diabetes mellitus without complications: Secondary | ICD-10-CM

## 2023-05-10 DIAGNOSIS — M1811 Unilateral primary osteoarthritis of first carpometacarpal joint, right hand: Secondary | ICD-10-CM

## 2023-05-10 DIAGNOSIS — R2231 Localized swelling, mass and lump, right upper limb: Secondary | ICD-10-CM | POA: Diagnosis not present

## 2023-05-10 DIAGNOSIS — G5601 Carpal tunnel syndrome, right upper limb: Secondary | ICD-10-CM

## 2023-05-10 DIAGNOSIS — J449 Chronic obstructive pulmonary disease, unspecified: Secondary | ICD-10-CM

## 2023-05-10 DIAGNOSIS — K582 Mixed irritable bowel syndrome: Secondary | ICD-10-CM

## 2023-05-10 LAB — POCT GLYCOSYLATED HEMOGLOBIN (HGB A1C): HbA1c, POC (controlled diabetic range): 7.4 % — AB (ref 0.0–7.0)

## 2023-05-10 LAB — GLUCOSE, POCT (MANUAL RESULT ENTRY): POC Glucose: 125 mg/dL — AB (ref 70–99)

## 2023-05-10 MED ORDER — DAPAGLIFLOZIN PROPANEDIOL 10 MG PO TABS: 10.0000 mg | ORAL_TABLET | Freq: Every day | ORAL | 1 refills | Status: DC

## 2023-05-10 NOTE — Progress Notes (Signed)
 Patient ID: Stacy [name] [facility], female    DOB: 29-Sep-1960  MRN: [id number]  CC: Diabetes (DM f/u. Med refill. Shyrl Numbers - Last BM 5-6 days ago/Already received flu vax)   Subjective: Stacy [name] is a 63 y.o. female who presents for chronic ds management. 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 Young Berry.    Discussed the use of AI scribe software for clinical note transcription with the patient, who gave verbal consent to proceed.  History of Present Illness patient with a history of diabetes, hypertension, and COPD, presents for a follow-up visit for her chronic disease management.   Constipation: She reports a recent issue with constipation for the past five to six days. She has not been using any medication for it due to financial constraints. The patient has a history of irritable bowel syndrome with constipation and diarrhea, managed by a gastroenterologist at [facility]. She takes Imodium as needed for diarrhea but does not take anything for constipation.  DM: Results for orders placed or performed in visit on 05/10/23  POCT glucose (manual entry)   Collection Time: 05/10/23  9:06 AM  Result Value Ref Range   POC Glucose 125 (A) 70 - 99 mg/dl  POCT glycosylated hemoglobin (Hb A1C)   Collection Time: 05/10/23  9:13 AM  Result Value Ref Range   Hemoglobin A1C     HbA1c POC (<> result, manual entry)     HbA1c, POC (prediabetic range)     HbA1c, POC (controlled diabetic range) 7.4 (A) 0.0 - 7.0 %   *Note: Due to a large number of results and/or encounters for the requested time period, some results have not been displayed. A complete set of results can be found in Results Review.  In regards to her  diabetes, her recent A1c was 7.4, an increase from her previous 6.8. She is currently taking [place] 5mg  and Metformin 750mg  once a day. She checks her blood sugars three times a day before meals and reports a range of 114 to 120, with the highest reading being 135 and the lowest 106. She admits to poor eating habits, often skipping meals and consuming fast food when her husband brings it home. -She has also had surgery on her right hand/wrist in February for which she is still having to wear a splint.  States she was told by Ortho that she has to wear it for 4 more weeks.  This has limited her ability to cook.  HTN: Compliant with taking Diovan/HCTZ 160/12.5 mg once a day.  She is also on potassium supplement 20 mEq daily.  She limits salt in the foods.  In regards to her cholesterol and PAD, she is consistent with taking the atorvastatin 40 mg daily and clopidogrel.  Overdue for repeat lipid profile.  She reports a new lump in her right cubital fossa that she noticed after the surgery.  It is sore at times.  She is due to see her orthopedic doctor in a few weeks.  In terms of her COPD, she reports no recent flares and is currently using Symbicort and Spiriva. She has been free of cigarettes since July of the previous year.     Patient Active Problem List   Diagnosis Date Noted  . Arthritis of carpometacarpal (  CMC) joint of right thumb 03/26/2023  . Carpal tunnel syndrome, right upper limb 03/26/2023  . Status post revision of total replacement of right knee 05/12/2022  . History of colonic polyps 03/20/2022  . Failed total knee, right, subsequent encounter 03/02/2022  . Loose right total knee arthroplasty (HCC) 03/02/2022  . Mixed stress and urge urinary incontinence 12/04/2021  . Functional fecal incontinence 12/04/2021  . IBS (irritable bowel syndrome) 11/19/2021  . Spondylolisthesis, lumbar region   . Other spondylosis with radiculopathy, lumbar region   . Other secondary scoliosis,  lumbar region   . Fusion of spine of lumbar region 03/25/2021  . Colon polyps 06/06/2020  . Lupus 06/06/2020  . Allergic rhinitis due to animal (cat) (dog) hair and dander 04/08/2020  . Allergic rhinitis due to pollen 04/08/2020  . Food allergy 04/08/2020  . Acute medial meniscus tear, left, subsequent encounter 03/21/2020  . Chronic pain of left knee 02/15/2020  . Paresthesia of skin 11/16/2019  . History of total knee replacement, right 11/16/2019  . Tobacco abuse 11/16/2019  . Centrilobular emphysema (HCC) 08/10/2019  . Incidental lung nodule, > 3mm and < 8mm 08/10/2019  . OSA on CPAP 08/10/2019  . Dyspnea on exertion 08/02/2019  . Hyperlipidemia 08/02/2019  . Lumbar radiculopathy 04/24/2019  . 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  . Post laminectomy syndrome 02/23/2019  . Abnormality of gait 02/23/2019  . 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 Unilateral primary osteoarthritis, left hip 12/28/2018  . Lesion of skin of left ear 12/26/2018  . Primary osteoarthritis of left hip 12/02/2018  . Iron deficiency anemia 10/16/2018  . Chronic pain syndrome 09/08/2018  . Chronic pain of right knee 08/11/2018  . Status post lumbar laminectomy 07/15/2018  . Peripheral arterial disease (HCC) 04/05/2018  . Moderate persistent asthma without complication 06/29/2017  . Environmental and seasonal allergies 06/29/2017  . Controlled type 2 diabetes mellitus with diabetic polyneuropathy, without long-term current use of insulin (HCC) 06/29/2017  . Perennial allergic rhinitis 04/08/2017  . Sensorineural hearing loss (SNHL), bilateral 04/08/2017  . Chronic pansinusitis 03/25/2017  . Eustachian tube dysfunction, bilateral 03/25/2017  . 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 Herniation of lumbar intervertebral disc with radiculopathy 10/02/2016    Class: Chronic  . Alopecia areata 08/19/2016  . Chondromalacia of both patellae 06/03/2015    Class: Chronic  . Spinal stenosis, lumbar region, with  neurogenic claudication 06/03/2015  . Tobacco use disorder 04/25/2015  . DJD (degenerative joint disease) of knee 01/04/2015  . Hemorrhoid 11/14/2014  . Gout of big toe 07/19/2014  . Essential hypertension 08/14/2013  . Gastroesophageal reflux disease without esophagitis 08/14/2013  . COPD (chronic obstructive pulmonary disease) (HCC) 04/17/2011     Current Outpatient Medications on File Prior to Visit  Medication Sig Dispense Refill  . Accu-Chek Softclix Lancets lancets USE AS INSTRUCTED 3 TIMES A DAY 100 each 0  . albuterol (PROVENTIL) (2.5 MG/3ML) 0.083% nebulizer solution USE ONE VIAL FOR UP TO 3 TIMES DAILY. 360 mL 0  . allopurinol (ZYLOPRIM) 100 MG tablet TAKE 1 TABLET (100 MG TOTAL) BY MOUTH DAILY.(AM) 90 tablet 3  . atorvastatin (LIPITOR) 40 MG tablet Take 1 tablet (40 mg total) by mouth daily. 90 tablet 1  . Blood Glucose Monitoring Suppl (ACCU-CHEK AVIVA PLUS) w/Device KIT 1 each by Does not apply route 3 (three) times daily. 1 kit 0  . celecoxib (CELEBREX) 200 MG capsule Take 1 capsule (200 mg total) by mouth daily. 30 capsule 3  . clopidogrel (PLAVIX)  75 MG tablet Take 1 tablet (75 mg total) by mouth daily. 90 tablet 1  . diclofenac Sodium (VOLTAREN) 1 % GEL Apply 2 g topically 4 (four) times daily. 350 g 5  . DULoxetine (CYMBALTA) 60 MG capsule TAKE 1 CAPSULE (60 MG TOTAL) BY MOUTH DAILY (AM) 30 capsule 6  . EPINEPHrine 0.3 mg/0.3 mL IJ SOAJ injection 0.3 mg IM x 1 PRN for allergic reaction 1 each 1  . [name] 325 (65 Fe) MG tablet TAKE ONE TABLET ( 325 MG ) BY MOUTH DAILY (AM) 100 tablet 1  . fluticasone (FLONASE) 50 MCG/ACT nasal spray PLACE 1 SPRAY INTO BOTH NOSTRILS DAILY. 16 g 6  . Glucagon 1 MG/0.2ML SOSY 1 mg arrange subcutaneously as needed for severe symptomatic low blood sugar 0.2 mL 0  . glucose blood (ACCU-CHEK AVIVA PLUS) test strip USE AS INSTRUCTED 3 TIMES A DAY 100 each 0  . glycopyrrolate (ROBINUL) 2 MG tablet Take 2 mg by mouth 3 (three) times daily.    .  hydrOXYzine (ATARAX) 10 MG tablet TAKE 1 TABLET IN THE MORNING AND NOON AND 2 TABLETS IN THE EVENING AS NEEDED. 120 tablet 1  . Lancets (ACCU-CHEK SOFT TOUCH) lancets Use as instructed 100 each 12  . loratadine (CLARITIN) 10 MG tablet TAKE 1 TABLET (10 MG TOTAL) BY MOUTH DAILY AS NEEDED FOR ALLERGIES. (AM) 90 tablet 1  . melatonin 5 MG TABS Take 5 mg by mouth at bedtime.    . metFORMIN (GLUCOPHAGE-XR) 750 MG 24 hr tablet Take 1 tablet (750 mg total) by mouth daily with breakfast. 90 tablet 1  . methocarbamol (ROBAXIN) 500 MG tablet TAKE 1 TABLET (500 MG TOTAL) BY MOUTH EVERY 6 (SIX) HOURS AS NEEDED FOR MUSCLE SPASMS. 40 tablet 1  . Methylnaltrexone Bromide 8 MG/0.4ML SOLN Take 0.4 mLs by mouth as needed. Take once daily as needed 2.1 mL 0  . montelukast (SINGULAIR) 10 MG tablet Take 10 mg by mouth daily.    . Multiple Vitamins-Minerals (CENTRUM SILVER 50+WOMEN PO) Take 1 tablet by mouth daily.    . pantoprazole (PROTONIX) 40 MG tablet Take 40 mg by mouth daily.    . potassium chloride SA (KLOR-CON M) 20 MEQ tablet TAKE 1 TABLET (20 MEQ TOTAL) BY MOUTH DAILY.(NOON ) 90 tablet 1  . Respiratory Therapy Supplies (FLUTTER) DEVI Use after breathing treatment 4 times daily 1 each 0  . SPIRIVA HANDIHALER 18 MCG inhalation capsule Place 1 capsule (18 mcg total) into inhaler and inhale daily. 90 capsule 0  . SYMBICORT 160-4.5 MCG/ACT inhaler Inhale 1 puff into the lungs 2 (two) times daily.    Marland Kitchen triamcinolone cream (KENALOG) 0.1 % Apply 1 Application topically 2 (two) times daily as needed (irritation). 45 g 1  . valsartan-hydrochlorothiazide (DIOVAN-HCT) 160-12.5 MG tablet TAKE ONE TABLET BY MOUTH ONCE DAILY (AM) 90 tablet 1  . [DISCONTINUED] Fluticasone-Salmeterol (ADVAIR) 500-50 MCG/DOSE AEPB Inhale 1 puff into the lungs every 12 (twelve) hours.      . [DISCONTINUED] lisinopril (PRINIVIL,ZESTRIL) 40 MG tablet Take 40 mg by mouth daily.   (Patient not taking: Reported on 05/10/2023)     Current  Facility-Administered Medications on File Prior to Visit  Medication Dose Route Frequency Provider Last Rate Last Admin  . glucose chewable tablet 12 g  3 tablet Oral Once Marcine Matar, MD        Allergies  Allergen Reactions  . Other Shortness Of Breath    UNSPECIFIED AGENTS Allergic to perfumes and cleaning products  .  Shellfish Allergy Anaphylaxis    Per allergy test.  . Ace Inhibitors Cough and Other (See Comments)       . Celebrex [Celecoxib]      upset stomach  Pt is taking med*  . Shellfish-Derived Products Other (See Comments)  . Aspirin Nausea Only and Other (See Comments)    stomach upset  Other reaction(s): Nausea Only    Social History   Socioeconomic History  . Marital status: Married    Spouse name: Not on file  . Number of children: 2  . Years of education: Not on file  . Highest education level: Not on file  Occupational History  . Occupation: unemployed    Associate Professor: UNEMPLOYED  Tobacco Use  . Smoking status: Former  . Smokeless tobacco: Never  Vaping Use  . Vaping status: Never Used  Substance and Sexual Activity  . Alcohol use: No  . Drug use: No  . Sexual activity: Yes    Birth control/protection: Surgical, Post-menopausal    Comment: tubal ligation  Other Topics Concern  . Not on file  Social History Narrative   Left Handed    Lives in a two story apartment. With husband   Drinks Caffeine 1 cup   retired   Social Drivers of [employer] Strain: Medium Risk (01/08/2023)   Overall Financial Resource Strain (CARDIA)   . Difficulty of Paying Living Expenses: Somewhat hard  Food Insecurity: Food Insecurity Present (01/08/2023)   Hunger Vital Sign   . Worried About Programme researcher, broadcasting/film/video in the Last Year: Sometimes true   . Ran Out of Food in the Last Year: Never true  Transportation Needs: No Transportation Needs (01/08/2023)   PRAPARE - Transportation   . Lack of Transportation (Medical): No   . Lack of Transportation  (Non-Medical): No  Physical Activity: Sufficiently Active (01/08/2023)   Exercise Vital Sign   . Days of Exercise per Week: 7 days   . Minutes of Exercise per Session: 30 min  Stress: Stress Concern Present (01/08/2023)   [employer] of Occupational Health - Occupational Stress Questionnaire   . Feeling of Stress : Rather much  Social Connections: Socially Integrated (01/08/2023)   Social Connection and Isolation Panel [NHANES]   . Frequency of Communication with Friends and Family: Twice a week   . Frequency of Social Gatherings with Friends and Family: More than three times a week   . Attends Religious Services: 1 to 4 times per year   . Active Member of Clubs or Organizations: Yes   . Attends Banker Meetings: Never   . Marital Status: Married  Catering manager Violence: Not At Risk (01/08/2023)   Humiliation, Afraid, Rape, and Kick questionnaire   . Fear of Current or Ex-Partner: No   . Emotionally Abused: No   . Physically Abused: No   . Sexually Abused: No    Family History  Problem Relation Age of Onset  . Hypertension Father   . Cancer Father   . Heart disease Mother   . Asthma Son        had as a child  . Heart disease Sister   . Breast cancer Sister   . Hypertension Brother     Past Surgical History:  Procedure Laterality Date  . BACK SURGERY     feb 21, 23  . CARPAL TUNNEL RELEASE Right 03/26/2023   Procedure: RIGHT CARPAL TUNNEL RELEASE;  Surgeon: Samuella Cota, MD;  Location: [facility];  Service: Orthopedics;  Laterality: Right;  . CARPOMETACARPEL SUSPENSION PLASTY Right 03/26/2023   Procedure: RIGHT THUMB CARPOMETACARPEL (CMC)  ARTHROPLASTY WITH INTERNAL BRACE;  Surgeon: Samuella Cota, MD;  Location: [facility];  Service: Orthopedics;  Laterality: Right;  . CHOLECYSTECTOMY    . COLONOSCOPY    . ENDOMETRIAL ABLATION  10/2010  . HERNIA REPAIR     umbicial hernia  . JOINT REPLACEMENT Left 03/14/2019   Dr.  Magnus Ivan hip  . KNEE ARTHROSCOPY Left    06/07/2017 Dr. August Saucer of [facility]  . KNEE ARTHROSCOPY Left 03/21/2020   Procedure: LEFT KNEE ARTHROSCOPY WITH PARTIAL MEDIAL MENISCECTOMY;  Surgeon: [name], MD;  Location: [facility];  Service: Orthopedics;  Laterality: Left;  . KNEE CLOSED REDUCTION Right 12/06/2015   Procedure: CLOSED MANIPULATION RIGHT KNEE;  Surgeon: Kerrin Champagne, MD;  Location: [facility];  Service: Orthopedics;  Laterality: Right;  . KNEE CLOSED REDUCTION Right 01/17/2016   Procedure: CLOSED MANIPULATION RIGHT KNEE;  Surgeon: Kerrin Champagne, MD;  Location: [facility];  Service: Orthopedics;  Laterality: Right;  . KNEE JOINT MANIPULATION Right 12/06/2015  . LACRIMAL TUBE INSERTION Bilateral 03/01/2019   Procedure: LACRIMAL TUBE INSERTION;  Surgeon: Aura Camps, MD;  Location: [facility];  Service: Ophthalmology;  Laterality: Bilateral;  . LACRIMAL TUBE REMOVAL Bilateral 05/03/2019   Procedure: BILATERAL NASOLACRIMAL DUCT PROBING, IIRIGATION AND TUBE REMOVAL BOTH EYES;  Surgeon: Aura Camps, MD;  Location: [facility];  Service: Ophthalmology;  Laterality: Bilateral;  . LUMBAR LAMINECTOMY/DECOMPRESSION MICRODISCECTOMY N/A 06/03/2015   Procedure: Bilateral lateral recess decompression L2-3, L3-4, L4-5;  Surgeon: Kerrin Champagne, MD;  Location: [facility];  Service: Orthopedics;  Laterality: N/A;  . LUMBAR LAMINECTOMY/DECOMPRESSION MICRODISCECTOMY N/A 10/02/2016   Procedure: Right L5-S1 Lateral Recess Decompression  microdiscectomy;  Surgeon: Kerrin Champagne, MD;  Location: [facility];  Service: Orthopedics;  Laterality: N/A;  . LUMBAR LAMINECTOMY/DECOMPRESSION MICRODISCECTOMY N/A 07/15/2018   Procedure: LEFT L3-4 MICRODISCECTOMY;  Surgeon: Kerrin Champagne, MD;  Location: [facility];  Service: Orthopedics;  Laterality: N/A;  . svd      x 2  . TEAR DUCT PROBING Bilateral 03/01/2019   Procedure: TEAR DUCT PROBING WITH IRRIGATION;  Surgeon:  Aura Camps, MD;  Location: [facility];  Service: Ophthalmology;  Laterality: Bilateral;  . TOTAL HIP ARTHROPLASTY Left 03/14/2019   Procedure: LEFT TOTAL HIP ARTHROPLASTY ANTERIOR APPROACH;  Surgeon: [name], MD;  Location: [facility];  Service: Orthopedics;  Laterality: Left;  . TOTAL KNEE ARTHROPLASTY Right 09/06/2015   Procedure: RIGHT TOTAL KNEE ARTHROPLASTY;  Surgeon: Kerrin Champagne, MD;  Location: [facility];  Service: Orthopedics;  Laterality: Right;  . TOTAL KNEE REVISION Right 05/12/2022   Procedure: RIGHT TOTAL KNEE REVISION ARTHROPLASTY;  Surgeon: [name], MD;  Location: [facility];  Service: Orthopedics;  Laterality: Right;  . TUBAL LIGATION    . UPPER GASTROINTESTINAL ENDOSCOPY  04/28/2011    ROS: Review of Systems Negative except as stated above  PHYSICAL EXAM: BP 116/71 (BP Location: Left Arm, Patient Position: Sitting, Cuff Size: Normal)   Pulse 94   Temp 99 F (37.2 C) (Oral)   Ht 5\' 6"  (1.676 m)   Wt 164 lb (74.4 kg)   LMP 09/01/2010   SpO2 97%   BMI 26.47 kg/m   Physical Exam  General appearance - alert, well appearing, and in no distress Neck - supple, no significant adenopathy Chest - clear to auscultation, no wheezes, rales  or rhonchi, symmetric air entry Heart - normal rate, regular rhythm, normal S1, S2, no murmurs, rubs, clicks or gallops Musculoskeletal -she is wearing a splint on the right hand/wrist.  Noted to have a 4 x 4 nontender raised soft mass in the decubital fossa medially.  It is nontender to touch.  No erythema. Extremities - peripheral pulses normal, no pedal edema, no clubbing or cyanosis      Latest Ref Rng & Units 03/19/2023   12:30 PM 06/17/2022   10:03 AM 06/01/2022   11:32 AM  CMP  Glucose 70 - 99 mg/dL 604   540   BUN 8 - 23 mg/dL 9   11   Creatinine 9.81 - 1.00 mg/dL 1.91   4.78   Sodium 295 - 145 mmol/L 139   131   Potassium 3.5 - 5.1 mmol/L 3.7   4.1   Chloride 98 - 111 mmol/L 101   94    CO2 22 - 32 mmol/L 24   24   Calcium 8.9 - 10.3 mg/dL 9.9  9.6  9.7   Total Protein 6.5 - 8.1 g/dL   7.7   Total Bilirubin 0.3 - 1.2 mg/dL   0.6   Alkaline Phos 38 - 126 U/L   120   AST 15 - 41 U/L   24   ALT 0 - 44 U/L   16    Lipid Panel     Component Value Date/Time   CHOL 132 01/05/2022 1215   TRIG 116 01/05/2022 1215   HDL 47 01/05/2022 1215   CHOLHDL 2.8 01/05/2022 1215   CHOLHDL 4.2 07/19/2014 1202   VLDL 24 07/19/2014 1202   LDLCALC 64 01/05/2022 1215    CBC    Component Value Date/Time   WBC 7.4 06/01/2022 1132   RBC 4.08 06/01/2022 1132   HGB 11.0 (L) 06/01/2022 1132   HGB 11.3 05/01/2021 1526   HCT 33.5 (L) 06/01/2022 1132   HCT 34.7 05/01/2021 1526   PLT 449 (H) 06/01/2022 1132   PLT 297 05/01/2021 1526   MCV 82.1 06/01/2022 1132   MCV 81 05/01/2021 1526   MCH 27.0 06/01/2022 1132   MCHC 32.8 06/01/2022 1132   RDW 14.6 06/01/2022 1132   RDW 15.7 (H) 05/01/2021 1526   LYMPHSABS 2.4 06/01/2022 1132   LYMPHSABS 4.1 (H) 10/14/2018 1704   MONOABS 0.5 06/01/2022 1132   EOSABS 0.1 06/01/2022 1132   EOSABS 0.1 10/14/2018 1704   BASOSABS 0.0 06/01/2022 1132   BASOSABS 0.0 10/14/2018 1704    ASSESSMENT AND PLAN: 1. Type 2 diabetes mellitus with diabetic polyneuropathy, without long-term current use of insulin (HCC) (Primary)  Discussed increasing Farxiga to improve glycemic control. Emphasized dietary modifications and exercise. - Increase Farxiga to 10 mg daily. - Encourage healthier food choices and regular meals. - Promote regular physical activity, such as walking. - Recheck potassium level in one week after increasing [place].  Advised to return to the lab after she has been on the increased dose for 1 week.  She may be able to stop the potassium supplement completely with the increased dose of Farxiga - Monitor for increased urination and ensure adequate hydration.  Advised to drink at least 4 to 8 glasses of water daily. - POCT glycosylated hemoglobin  (Hb A1C) - POCT glucose (manual entry) - dapagliflozin propanediol (FARXIGA) 10 MG TABS tablet; Take 1 tablet (10 mg total) by mouth daily before breakfast.  Dispense: 90 tablet; Refill: 1 - Basic Metabolic Panel; Future  2. Diabetes mellitus treated with oral medication (HCC) See #1 above  3. Hypertension associated with type 2 diabetes mellitus (HCC) At goal.  Continue Diovan/HCTZ 160/12.5 mg daily  4. PAD (peripheral artery disease) (HCC) Continue atorvastatin 40 mg daily and clopidogrel.  She returns to the lab to have chemistry done  5. Chronic obstructive pulmonary disease, unspecified COPD type (HCC) Stable.  Continue Symbicort and Spiriva  6. Irritable bowel syndrome with both constipation and diarrhea - Recommend MiraLAX as needed for constipation. - Increase dietary fiber intake with green leafy vegetables and fruits. - Ensure adequate hydration with at least four to eight glasses of water daily.  7. Mass of right forearm Mass possibly a cyst in the muscle, causing occasional soreness. - Order ultrasound of the right cubital fossa mass. - Follow up with orthopedics on May 15.   Patient was given the opportunity to ask questions.  Patient verbalized understanding of the plan and was able to repeat key elements of the plan.   This documentation was completed using Paediatric nurse.  Any transcriptional errors are unintentional.  Orders Placed This Encounter  Procedures  . [place] RT UPPER EXTREM LTD SOFT TISSUE NON VASCULAR  . Lipid panel  . Comprehensive metabolic panel with GFR  . POCT glycosylated hemoglobin (Hb A1C)  . POCT glucose (manual entry)     Requested Prescriptions   Signed Prescriptions Disp Refills  . dapagliflozin propanediol (FARXIGA) 10 MG TABS tablet 90 tablet 1    Sig: Take 1 tablet (10 mg total) by mouth daily before breakfast.    Return in about 4 months (around 09/09/2023) for chronic ds management.  Jonah Blue, MD,  FACP

## 2023-05-10 NOTE — Progress Notes (Signed)
   Stacy [name] - 63 y.o. female MRN [id number]  Date of birth: 01-28-1961  Office Visit Note: Visit Date: 05/10/2023 PCP: Marcine Matar, MD Referred by: Marcine Matar, MD  Subjective:  HPI: Stacy [name] is a 63 y.o. female who presents today for follow up 6 weeks status post right thumb carpometacarpal arthroplasty with internal brace, right carpal tunnel release.  She is doing very well overall, is progressing with OT nicely.  Numbness and tingling has improved significantly.  Range of motion of the thumb continues to improve with OT.  Brace has been downsized to hand-based.  Pertinent ROS were reviewed with the patient and found to be negative unless otherwise specified above in HPI.   Assessment & Plan: Visit Diagnoses:  1. Arthritis of carpometacarpal (CMC) joint of right thumb   2. Carpal tunnel syndrome, right upper limb     Plan: She continues to do very well postoperatively.  Continue with OT as instructed, progress to strengthening at week 8 per protocol.  Follow-up with myself in approximately 6 weeks.  Follow-up: No follow-ups on file.   Meds & Orders: No orders of the defined types were placed in this encounter.  No orders of the defined types were placed in this encounter.    Procedures: No procedures performed       Objective:   Vital Signs: LMP 09/01/2010   Ortho Exam Right wrist: - Well-healed incision at the glabrous/nonglabrous juncture over the [facility] region of the thumb, well-healed palmar incision - Thumb opposition to the small finger DPC - Thumb circumduction without significant pain or crepitus - Hand is warm well-perfused, sensation intact in all distributions including DRSN    Imaging: No results found.   Stacy [name], M.D. [facility], Hand Surgery

## 2023-05-10 NOTE — Patient Instructions (Signed)
 Increase Farxiga to 10 mg daily. With this increased dose, it is important that you stay hydrated.  Make sure that you are drinking 4-8 full glasses of water daily.  We may be able to take you off potassium supplement pills with the increased dose of Farxiga.  We will need to recheck your potassium level after you have been on the increased dose for 1 week.  Please return to the lab to have that blood test done.

## 2023-05-11 ENCOUNTER — Ambulatory Visit: Payer: Medicaid Other | Admitting: Occupational Therapy

## 2023-05-11 ENCOUNTER — Encounter: Payer: Self-pay | Admitting: Occupational Therapy

## 2023-05-11 DIAGNOSIS — M6281 Muscle weakness (generalized): Secondary | ICD-10-CM

## 2023-05-11 DIAGNOSIS — M25541 Pain in joints of right hand: Secondary | ICD-10-CM

## 2023-05-11 DIAGNOSIS — R208 Other disturbances of skin sensation: Secondary | ICD-10-CM

## 2023-05-11 DIAGNOSIS — R278 Other lack of coordination: Secondary | ICD-10-CM

## 2023-05-11 NOTE — Patient Instructions (Signed)
  May begin to remove splint for light activity (eating, brushing teeth, etc). Approximately 30-60 minutes, 3-4x per day. Gradually wean further over the next 2-4 weeks.        Press thumb against 2 fingers of other hand as if going towards tip of small finger without actually moving it. Hold __10__ seconds. Relax. Repeat __5__ times. Do __4-6__ sessions per day.  Copyright  VHI. All rights reserved.

## 2023-05-11 NOTE — Therapy (Signed)
 OUTPATIENT OCCUPATIONAL THERAPY ORTHO TREATMENT  Patient Name: Stacy [name] [facility] MRN: [id number] DOB:15-Jul-1960, 63 y.o., female Today's Date: 05/11/2023  PCP: Marcine Matar, MD  REFERRING PROVIDER: Samuella Cota, MD   END OF SESSION:  OT End of Session - 05/11/23 1023     Visit Number 10    Number of Visits 18   including eval   Date for OT Re-Evaluation 06/25/23    Authorization Type [employer]    OT Start Time 1018    OT Stop Time 1100    OT Time Calculation (min) 42 min    Activity Tolerance Patient tolerated treatment well    Behavior During Therapy WFL for tasks assessed/performed                    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   Asthma    COPD (chronic obstructive pulmonary disease) (HCC)    Depression    Diabetes mellitus    Type II   GERD (gastroesophageal reflux disease)    Pt on Protonix daily   Glaucoma    Gout    Headache(784.0)    otc meds prn   Hyperlipidemia    Hypertension    Irritable bowel syndrome 11/19/2010   Neuropathy    Pneumonia YRS AGO   Restless legs    Shortness of breath    07/14/2018- uses  4 times a day   Sleep apnea    Past Surgical History:  Procedure Laterality Date   BACK SURGERY     feb 21, 23   CARPAL TUNNEL RELEASE Right 03/26/2023   Procedure: RIGHT CARPAL TUNNEL RELEASE;  Surgeon: Samuella Cota, MD;  Location: [facility];  Service: Orthopedics;  Laterality: Right;   CARPOMETACARPEL SUSPENSION PLASTY Right 03/26/2023   Procedure: RIGHT THUMB CARPOMETACARPEL (CMC)  ARTHROPLASTY WITH INTERNAL BRACE;  Surgeon: Samuella Cota, MD;  Location: [facility];  Service: Orthopedics;  Laterality: Right;   CHOLECYSTECTOMY     COLONOSCOPY     ENDOMETRIAL ABLATION  10/2010   HERNIA REPAIR     umbicial hernia   JOINT REPLACEMENT Left  03/14/2019   Dr. Magnus Ivan hip   KNEE ARTHROSCOPY Left    06/07/2017 Dr. August [facility]   KNEE ARTHROSCOPY Left 03/21/2020   Procedure: LEFT KNEE ARTHROSCOPY WITH PARTIAL MEDIAL MENISCECTOMY;  Surgeon: [name], MD;  Location:  SURGERY CENTER;  Service: Orthopedics;  Laterality: Left;   KNEE CLOSED REDUCTION Right 12/06/2015   Procedure: CLOSED MANIPULATION RIGHT KNEE;  Surgeon: Kerrin Champagne, MD;  Location: [facility] OR;  Service: Orthopedics;  Laterality: Right;   KNEE CLOSED REDUCTION Right 01/17/2016   Procedure: CLOSED MANIPULATION RIGHT KNEE;  Surgeon: Kerrin Champagne, MD;  Location: [facility] OR;  Service: Orthopedics;  Laterality: Right;   KNEE JOINT MANIPULATION Right 12/06/2015   LACRIMAL TUBE INSERTION Bilateral 03/01/2019   Procedure: LACRIMAL TUBE INSERTION;  Surgeon: Aura Camps, MD;  Location: [facility];  Service: Ophthalmology;  Laterality: Bilateral;   LACRIMAL TUBE REMOVAL Bilateral 05/03/2019   Procedure: BILATERAL NASOLACRIMAL DUCT PROBING, IIRIGATION AND TUBE REMOVAL BOTH EYES;  Surgeon: Aura Camps, MD;  Location:  SURGERY CENTER;  Service: Ophthalmology;  Laterality: Bilateral;   LUMBAR LAMINECTOMY/DECOMPRESSION MICRODISCECTOMY N/A 06/03/2015   Procedure: Bilateral lateral recess decompression L2-3, L3-4,  L4-5;  Surgeon: Kerrin Champagne, MD;  Location: [facility] OR;  Service: Orthopedics;  Laterality: N/A;   LUMBAR LAMINECTOMY/DECOMPRESSION MICRODISCECTOMY N/A 10/02/2016   Procedure: Right L5-S1 Lateral Recess Decompression  microdiscectomy;  Surgeon: Kerrin Champagne, MD;  Location: [facility] OR;  Service: Orthopedics;  Laterality: N/A;   LUMBAR LAMINECTOMY/DECOMPRESSION MICRODISCECTOMY N/A 07/15/2018   Procedure: LEFT L3-4 MICRODISCECTOMY;  Surgeon: Kerrin Champagne, MD;  Location: [facility] OR;  Service: Orthopedics;  Laterality: N/A;   svd      x 2   TEAR DUCT PROBING Bilateral 03/01/2019   Procedure: TEAR DUCT PROBING WITH IRRIGATION;   Surgeon: Aura Camps, MD;  Location: [facility];  Service: Ophthalmology;  Laterality: Bilateral;   TOTAL HIP ARTHROPLASTY Left 03/14/2019   Procedure: LEFT TOTAL HIP ARTHROPLASTY ANTERIOR APPROACH;  Surgeon: [name], MD;  Location: [facility];  Service: Orthopedics;  Laterality: Left;   TOTAL KNEE ARTHROPLASTY Right 09/06/2015   Procedure: RIGHT TOTAL KNEE ARTHROPLASTY;  Surgeon: Kerrin Champagne, MD;  Location: [facility] OR;  Service: Orthopedics;  Laterality: Right;   TOTAL KNEE REVISION Right 05/12/2022   Procedure: RIGHT TOTAL KNEE REVISION ARTHROPLASTY;  Surgeon: [name], MD;  Location: [facility] OR;  Service: Orthopedics;  Laterality: Right;   TUBAL LIGATION     UPPER GASTROINTESTINAL ENDOSCOPY  04/28/2011   Patient Active Problem List   Diagnosis Date Noted   Arthritis of carpometacarpal [facility]) joint of right thumb 03/26/2023   Carpal tunnel syndrome, right upper limb 03/26/2023   Status post revision of total replacement of right knee 05/12/2022   History of colonic polyps 03/20/2022   Failed total knee, right, subsequent encounter 03/02/2022   Loose right total knee arthroplasty (HCC) 03/02/2022   Mixed stress and urge urinary incontinence 12/04/2021   Functional fecal incontinence 12/04/2021   IBS (irritable bowel syndrome) 11/19/2021   Spondylolisthesis, lumbar region    Other spondylosis with radiculopathy, lumbar region    Other secondary scoliosis, lumbar region    Fusion of spine of lumbar region 03/25/2021   Colon polyps 06/06/2020   Lupus 06/06/2020   Allergic rhinitis due to animal (cat) (dog) hair and dander 04/08/2020   Allergic rhinitis due to pollen 04/08/2020   Food allergy 04/08/2020   Acute medial meniscus tear, left, subsequent encounter 03/21/2020   Chronic pain of left knee 02/15/2020   Paresthesia of skin 11/16/2019   History of total knee replacement, right 11/16/2019   Tobacco abuse 11/16/2019   Centrilobular emphysema  (HCC) 08/10/2019   Incidental lung nodule, > 3mm and < 8mm 08/10/2019   OSA on CPAP 08/10/2019   Dyspnea on exertion 08/02/2019   Hyperlipidemia 08/02/2019   Lumbar radiculopathy 04/24/2019   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   Post laminectomy syndrome 02/23/2019   Abnormality of gait 02/23/2019   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   Unilateral primary osteoarthritis, left hip 12/28/2018   Lesion of skin of left ear 12/26/2018   Primary osteoarthritis of left hip 12/02/2018   Iron deficiency anemia 10/16/2018   Chronic pain syndrome 09/08/2018   Chronic pain of right knee 08/11/2018   Status post lumbar laminectomy 07/15/2018   Peripheral arterial disease (HCC) 04/05/2018   Moderate persistent asthma without complication 06/29/2017   Environmental and seasonal allergies 06/29/2017   Controlled type 2 diabetes mellitus with diabetic polyneuropathy, without long-term current use of insulin (HCC) 06/29/2017   Perennial allergic rhinitis 04/08/2017   Sensorineural hearing loss (SNHL), bilateral 04/08/2017   Chronic pansinusitis  03/25/2017   Eustachian tube dysfunction, bilateral 03/25/2017   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   Herniation of lumbar intervertebral disc with radiculopathy 10/02/2016    Class: Chronic   Alopecia areata 08/19/2016   Chondromalacia of both patellae 06/03/2015    Class: Chronic   Spinal stenosis, lumbar region, with neurogenic claudication 06/03/2015   Tobacco use disorder 04/25/2015   DJD (degenerative joint disease) of knee 01/04/2015   Hemorrhoid 11/14/2014   Gout of big toe 07/19/2014   Essential hypertension 08/14/2013   Gastroesophageal reflux disease without esophagitis 08/14/2013   COPD (chronic obstructive pulmonary disease) (HCC) 04/17/2011    ONSET DATE: 03/30/2023 (referral date), 03/26/23 (date of surgery, s/p R thumb CMC arthroplasty, R open carpal tunnel release)  REFERRING DIAG: M18.11 (ICD-10-CM) - Arthritis of carpometacarpal  (CMC) joint of right thumb  THERAPY DIAG:  Muscle weakness (generalized)  Other lack of coordination  Other disturbances of skin sensation  Rationale for Evaluation and Treatment: Rehabilitation  SUBJECTIVE:   SUBJECTIVE STATEMENT: Pain Rt thumb usually 5-6/10.  Pt now 6.5 weeks post op and progressing nicely through protocol  Pt accompanied by: self  PERTINENT HISTORY: 03/26/23 R thumb CMC arthroplasty and R open carpal tunnel release, s/p R TKA 2024, lupus, L hip total arthroplasty, tobacco abuse, hyperlipidemia, OSA on CPAP, essential HTN, COPD, GERD, HPV   03/30/23 Referral Notes:  Splint fabrication S/p [facility] arthroplasty *Thursday  March 6th after 9 am  PRECAUTIONS: Per 5th Edition [place] [facility] protocol, Fall, Other:    Per 03/26/23 OP Note: The patient will be non weight bearing on the right upper extremity in a spica splint. I will see the patient back in the office in 2 weeks for postoperative followup.   RED FLAGS: Hx of lupus   WEIGHT BEARING RESTRICTIONS: Yes   , per [facility] arthroplasty protocol   PAIN:  Are you having pain? Yes: NPRS scale: 3 out of 10 Pain location: at palm of affected hand Pain description: dull Aggravating factors: moving a certain way or sleeping with hand hanging down Relieving factors: rest, propping hand in bed  FALLS: Has patient fallen in last 6 months? Yes. Number of falls 1 L knee buckled on Friday last week at surgery center, pt reported no injuries, assistance from nurses  to get up  04/29/23 update - Pt reported L knee buckled and pt fell down to the ground. Pt denied injuries though required assistance from family to get up.   LIVING ENVIRONMENT: Lives with: lives with their spouse Lives in: House/apartment Stairs: Yes: Internal: 12 steps; none and External: 2 steps; on right going up and on left going up   PLOF: Independent with basic ADLs, transportation service for community mobility  PATIENT GOALS: "improvement and mobility  with the hand"  NEXT MD VISIT: 05/10/23  OBJECTIVE:  Note: Objective measures were completed at Evaluation unless otherwise noted.  HAND DOMINANCE: Left  ADLs: Eating: ind Grooming: denture care, some difficulty using one hand to replace/remove dentures Upper body dressing: ind with pullover shirt with extra time, some pain of affected UE Lower body dressing: ind with extra time, difficulty with zippers d/t using L  hand Toileting: ind Bathing: sponge bathing, difficulty d/t one hand, assistance from spouse to wash back Tub shower transfers: currently sponge bathing  FUNCTIONAL OUTCOME MEASURES: Quick Dash: 90.9% deficit     05/07/23 - 43.2% deficit   UPPER EXTREMITY ROM:     Active ROM Right eval Left eval  Shoulder flexion Unable to fully assess d/t  presence of sling [facility]  Shoulder abduction Unable to fully assess d/t presence of sling   Shoulder adduction    Shoulder extension    Shoulder internal rotation    Shoulder external rotation    Elbow flexion WFL   Elbow extension WFL   Wrist flexion    Wrist extension    Wrist ulnar deviation    Wrist radial deviation    Wrist pronation    Wrist supination    (Blank rows = not tested)  04/02/23 - Unable to assess ROM of affected hand d/t recent surgery and current precautions and presence of bulky compressive dressing.  Active ROM Right eval Left eval  Thumb MCP (0-60)      WFL                           Thumb IP (0-80)    Thumb Radial abd/add (0-55)    Thumb Palmar abd/add (0-45)    Thumb Opposition to Small Finger    Index MCP (0-90)    Index PIP (0-100)    Index DIP (0-70)     Long MCP (0-90)     Long PIP (0-100)     Long DIP (0-70)     Ring MCP (0-90)     Ring PIP (0-100)     Ring DIP (0-70)     Little MCP (0-90)     Little PIP (0-100)     Little DIP (0-70)     (Blank rows = not tested)  RUE 04/08/23 Wrist flex = 55*, ext = 45*  Thumb IP joint = 45* Shoulder, elbow, forearm and hand ROM  WFL's  HAND FUNCTION: Grip strength: Unable to assess secondary to current precautions  COORDINATION: Eval: Unable to assess secondary to current precautions  05/07/23 - 9-hole peg test: LUE - 30 seconds, RUE - 41 seconds  SENSATION: Moderate tingling/numbness in B fingers  EDEMA: Mild in fingers, unable to assess full hand d/t presence of bulky compressive covering  COGNITION: Overall cognitive status: Within functional limits for tasks assessed  OBSERVATIONS: Pt was pleasant and appeared well-kept. Pt currently wearing sling of RUE with affected hand wrapped in Ace wrap as bulky compressive dressing.   TREATMENT DATE:    05/11/23  Pt saw MD yesterday and he was pleased with her progression (per protocol). Wait on strengthening until 8 weeks post-op per MD and protocol.                                                                                                             Self-Care OT educated pt on updated splint wear schedule. Handout provided, see pt instructions (slightly revised from last session). OT educated pt on examples of "light activities" around the home and advised pt to continue to avoid strengthening per current precautions including NO lifting, pushing, pulling, gripping activities. Pt verbalized understanding.   Reviewed scar massage - skin seems to be moving fairly nicely  TherEx Added isometric ex for thumb in abd/opposition with  2 finger resistance - see pt instructions for details  05/11/23:  Thumb MP flexion = 47* Thumb IP flexion = 50* Thumb palmer abd = 65* Thumb radial abd = 68*  Renewal also completed today   PATIENT EDUCATION: Education details: thumb isometric ex  Person educated: Patient  Education method: Explanation, Demonstration, Verbal cues, and Handouts Education comprehension: verbalized understanding and returned demonstration  HOME EXERCISE PROGRAM: 04/08/23: splint wear and care, hygiene care, precautions, initial A/ROM HEP for RUE  (NOT for CMC/MP joint of thumb) 04/23/23 - thumb ROM, Access Code: [email or web address], splint wear schedule per protocol: hand-based splint during day, forearm-based at night.  04/26/23 - thumb palmar ADD (access code: same as above) 04/29/23 - RUE PROM thumb IP flex in splint, place-and-hold, hold 10 seconds, 10 reps, 4x per day 05/11/23: thumb isometric ex (per protocol)   GOALS: Goals reviewed with patient? Yes  SHORT TERM GOALS: Target date: 04/23/23  Pt will obtain protective, custom forearm based thumb spica splint/orthotic and ind recall splint wear/care schedule. Baseline: Pt currently has compressive bulky dressing on RUE with sling. 04/26/23 - Pt ind recalled splint wear/care schedule. Goal status: MET  2.  Pt will demo understanding of initial RUE HEP to improve pain levels and prerequisite motion for functional use of her R hand/thumb. Baseline: new to outpt OT 04/26/23 - Pt continues to benefit from v/c for completion of HEP. 04/29/23 - Pt returned demo of initial RUE HPE with min to no v/c. Goal status: MET  3.  Pt will return demo of scar management techniques and edema management strategies. Baseline: new to outpt OT 04/29/23 - OT initiated scar massage HEP and pt returned demo.  05/03/23 - Pt returned demo of scar massage. Goal status: MET   LONG TERM GOALS: Target date: 06/25/23   Pt will be ind with updated RUE HEP Baseline: new to outpt OT Goal status: in progress  2.  Patient will demonstrate at least 16% improvement with quick Dash score (reporting  27.3% disability or less) indicating improved functional use of affected extremity.  Baseline: 90.9% deficit  05/07/23 - 05/07/23 - 43.2% deficit Goal status: MET and revised on 05/07/23  3.  Pt will improve grip strength in R hand from unable to assess to at least 15 lbs for functional use at home and in ADLs/IADLs per precautions and protocol. Baseline: unable to assess secondary to current precautions Goal status: INITIAL  4.  Pt  will improve A/ROM in R thumb to PIP (or greater) of R small finger, to have functional motion for tasks like ADL's, reach and grasp  Baseline: unable to assess AROM secondary to current precautions and presence of compressive bulky dressing. 04/26/23 - Pt demo'd thumb opposition to R small finger PIP joint. Goal status: MET  5.  Pt will improve R hand functional use to at least be Mod I for basic ADL's (ie, brushing her hair, bathing, brushing her teeth and folding laundry) to assist in ability to carry out self-care and higher-level home-care tasks with less difficulty. Baseline: unable to assess, compensating with L hand for all tasks d/t NWB RUE 05/07/23 - Pt beginning to use R hand for light tasks while wearing splint, such as folding laundry. Goal status: in progress  6. Patient will demo improved FM coordination as evidenced by completing nine-hole peg with use of RUE in 40 seconds or less.  Baseline: unable to assess secondary to current precautions and presence of bulky compressive dressing. 05/07/23 - 9-hole peg test:  LUE - 30 seconds, RUE - 41 seconds  Goal Status: in progress  ASSESSMENT:  CLINICAL IMPRESSION: Pt has met all STG's and some LTG's. Renewal completed today to continue for up to 6 more weeks to address ongoing/unmet goals and progress strengthening once allowed at 8 weeks post-op. Pt currently 6.5 weeks post op.  Pt tolerating tasks well. Pt to begin weaning splint. OT to monitor as pt begins to return to light activities with affected hand. Pt would continue to benefit from skilled OT services in the outpatient setting to work on impairments as noted below to help pt return to PLOF as able.     PERFORMANCE DEFICITS: in functional skills including ADLs, IADLs, coordination, dexterity, proprioception, sensation, edema, ROM, strength, pain, Fine motor control, Gross motor control, mobility, body mechanics, endurance, wound, skin integrity, and UE functional use, cognitive skills  including  none , and psychosocial skills including environmental adaptation.   IMPAIRMENTS: are limiting patient from ADLs, IADLs, rest and sleep, leisure, and social participation.   COMORBIDITIES: may have co-morbidities  that affects occupational performance. Patient will benefit from skilled OT to address above impairments and improve overall function.  MODIFICATION OR ASSISTANCE TO COMPLETE EVALUATION: Min-Moderate modification of tasks or assist with assess necessary to complete an evaluation.  OT OCCUPATIONAL PROFILE AND HISTORY: Detailed assessment: Review of records and additional review of physical, cognitive, psychosocial history related to current functional performance.  CLINICAL DECISION MAKING: Moderate - several treatment options, min-mod task modification necessary  REHAB POTENTIAL: Good  EVALUATION COMPLEXITY: Moderate      PLAN:  OT FREQUENCY: 1-2x/week   OT DURATION: for up to 6 additional weeks (beginning week of 05/17/23)  PLANNED INTERVENTIONS: 40981 OT Re-evaluation, 97535 self care/ADL training, 19147 therapeutic exercise, 97530 therapeutic activity, 97112 neuromuscular re-education, 97140 manual therapy, 97035 ultrasound, 97018 paraffin, 82956 fluidotherapy, 97010 moist heat, 97010 cryotherapy, 97760 Orthotics management and training, 21308 Splinting (initial encounter), [id number] Subsequent splinting/medication, passive range of motion, functional mobility training, energy conservation, patient/family education, and DME and/or AE instructions  RECOMMENDED OTHER SERVICES: N/A  CONSULTED AND AGREED WITH PLAN OF CARE: Patient  PLAN FOR NEXT SESSION:  Fluidotherapy, review isometric ex for thumb, reassess ROM prn (see updates today), practice light functional tasks. (Begin light strengthening at 8 weeks post-op)    For all possible CPT codes, reference the Planned Interventions line above.     Check all conditions that are expected to impact treatment:  {Conditions expected to impact treatment:Musculoskeletal disorders and Active major medical illness  (Lupus)  If treatment provided at initial evaluation, no treatment charged due to lack of authorization.        Sheran Lawless, OT 05/11/2023, 11:01 AM

## 2023-05-12 DIAGNOSIS — F331 Major depressive disorder, recurrent, moderate: Secondary | ICD-10-CM | POA: Diagnosis not present

## 2023-05-13 ENCOUNTER — Ambulatory Visit
Admission: RE | Admit: 2023-05-13 | Discharge: 2023-05-13 | Discharge status: Home / Self Care | Source: Ambulatory Visit | Attending: Internal Medicine | Admitting: Internal Medicine

## 2023-05-13 DIAGNOSIS — R2231 Localized swelling, mass and lump, right upper limb: Secondary | ICD-10-CM

## 2023-05-14 ENCOUNTER — Encounter: Payer: Medicaid Other | Admitting: Occupational Therapy

## 2023-05-16 DIAGNOSIS — G4733 Obstructive sleep apnea (adult) (pediatric): Secondary | ICD-10-CM | POA: Diagnosis not present

## 2023-05-16 DIAGNOSIS — J449 Chronic obstructive pulmonary disease, unspecified: Secondary | ICD-10-CM | POA: Diagnosis not present

## 2023-05-17 ENCOUNTER — Ambulatory Visit: Payer: Medicaid Other | Admitting: Occupational Therapy

## 2023-05-17 DIAGNOSIS — F331 Major depressive disorder, recurrent, moderate: Secondary | ICD-10-CM | POA: Diagnosis not present

## 2023-05-20 ENCOUNTER — Encounter: Payer: Medicaid Other | Admitting: Occupational Therapy

## 2023-05-25 ENCOUNTER — Encounter: Payer: Self-pay | Admitting: Occupational Therapy

## 2023-05-25 ENCOUNTER — Ambulatory Visit: Admitting: Occupational Therapy

## 2023-05-25 DIAGNOSIS — R208 Other disturbances of skin sensation: Secondary | ICD-10-CM

## 2023-05-25 DIAGNOSIS — M6281 Muscle weakness (generalized): Secondary | ICD-10-CM | POA: Diagnosis not present

## 2023-05-25 DIAGNOSIS — R278 Other lack of coordination: Secondary | ICD-10-CM

## 2023-05-25 NOTE — Therapy (Signed)
 OUTPATIENT OCCUPATIONAL THERAPY ORTHO TREATMENT  Patient Name: Stacy [name] MRN: [id number] DOB:[birth year], 63 y.o., female Today's Date: 05/25/2023  PCP: Lawrance Presume, MD  REFERRING PROVIDER: Merrill Abide, MD   END OF SESSION:  OT End of Session - 05/25/23 2725     Visit Number 11    Number of Visits 18   including eval   Date for OT Re-Evaluation 06/25/23    Authorization Type [employer]    OT Start Time 0802    OT Stop Time 0845    OT Time Calculation (min) 43 min    Activity Tolerance Patient tolerated treatment well    Behavior During Therapy WFL for tasks assessed/performed                    Past Medical History:  Diagnosis Date   Allergy     Shellfish, cleaning products   Anxiety    Arthritis    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   Asthma    COPD (chronic obstructive pulmonary disease) (HCC)    Depression    Diabetes mellitus    Type II   GERD (gastroesophageal reflux disease)    Pt on Protonix  daily   Glaucoma    Gout    Headache(784.0)    otc meds prn   Hyperlipidemia    Hypertension    Irritable bowel syndrome 11/19/2010   Neuropathy    Pneumonia YRS AGO   Restless legs    Shortness of breath    07/14/2018- uses  4 times a day   Sleep apnea    Past Surgical History:  Procedure Laterality Date   BACK SURGERY     feb 21, 23   CARPAL TUNNEL RELEASE Right 03/26/2023   Procedure: RIGHT CARPAL TUNNEL RELEASE;  Surgeon: Merrill Abide, MD;  Location: [facility];  Service: Orthopedics;  Laterality: Right;   CARPOMETACARPEL SUSPENSION PLASTY Right 03/26/2023   Procedure: RIGHT THUMB CARPOMETACARPEL (CMC)  ARTHROPLASTY WITH INTERNAL BRACE;  Surgeon: Merrill Abide, MD;  Location: [facility];  Service: Orthopedics;  Laterality: Right;   CHOLECYSTECTOMY     COLONOSCOPY     ENDOMETRIAL ABLATION  10/2010   HERNIA REPAIR     umbicial hernia   JOINT REPLACEMENT Left  03/14/2019   Dr. Lucienne Ryder hip   KNEE ARTHROSCOPY Left    06/07/2017 Dr. Rozelle [facility]   KNEE ARTHROSCOPY Left 03/21/2020   Procedure: LEFT KNEE ARTHROSCOPY WITH PARTIAL MEDIAL MENISCECTOMY;  Surgeon: Arnie Lao, MD;  Location: [facility];  Service: Orthopedics;  Laterality: Left;   KNEE CLOSED REDUCTION Right 12/06/2015   Procedure: CLOSED MANIPULATION RIGHT KNEE;  Surgeon: Alphonso Jean, MD;  Location: [facility];  Service: Orthopedics;  Laterality: Right;   KNEE CLOSED REDUCTION Right 01/17/2016   Procedure: CLOSED MANIPULATION RIGHT KNEE;  Surgeon: Alphonso Jean, MD;  Location: [facility];  Service: Orthopedics;  Laterality: Right;   KNEE JOINT MANIPULATION Right 12/06/2015   LACRIMAL TUBE INSERTION Bilateral 03/01/2019   Procedure: LACRIMAL TUBE INSERTION;  Surgeon: Lorena Rolling, MD;  Location[facility];  Service: Ophthalmology;  Laterality: Bilateral;   LACRIMAL TUBE REMOVAL Bilateral 05/03/2019   Procedure: BILATERAL NASOLACRIMAL DUCT PROBING, IIRIGATION AND TUBE REMOVAL BOTH EYES;  Surgeon: Lorena Rolling, MD;  Location: [facility];  Service: Ophthalmology;  Laterality: Bilateral;   LUMBAR LAMINECTOMY/DECOMPRESSION MICRODISCECTOMY N/A 06/03/2015   Procedure: Bilateral lateral recess decompression L2-3, L3-4,  L4-5;  Surgeon: Alphonso Jean, MD;  Location: [facility] OR;  Service: Orthopedics;  Laterality: N/A;   LUMBAR LAMINECTOMY/DECOMPRESSION MICRODISCECTOMY N/A 10/02/2016   Procedure: Right L5-S1 Lateral Recess Decompression  microdiscectomy;  Surgeon: Alphonso Jean, MD;  Location: [facility] OR;  Service: Orthopedics;  Laterality: N/A;   LUMBAR LAMINECTOMY/DECOMPRESSION MICRODISCECTOMY N/A 07/15/2018   Procedure: LEFT L3-4 MICRODISCECTOMY;  Surgeon: Alphonso Jean, MD;  Location: [facility];  Service: Orthopedics;  Laterality: N/A;   svd      x 2   TEAR DUCT PROBING Bilateral 03/01/2019   Procedure: TEAR DUCT PROBING WITH IRRIGATION;   Surgeon: Lorena Rolling, MD;  Location: [facility];  Service: Ophthalmology;  Laterality: Bilateral;   TOTAL HIP ARTHROPLASTY Left 03/14/2019   Procedure: LEFT TOTAL HIP ARTHROPLASTY ANTERIOR APPROACH;  Surgeon: Arnie Lao, MD;  Location: [facility];  Service: Orthopedics;  Laterality: Left;   TOTAL KNEE ARTHROPLASTY Right 09/06/2015   Procedure: RIGHT TOTAL KNEE ARTHROPLASTY;  Surgeon: Alphonso Jean, MD;  Location: [facility];  Service: Orthopedics;  Laterality: Right;   TOTAL KNEE REVISION Right 05/12/2022   Procedure: RIGHT TOTAL KNEE REVISION ARTHROPLASTY;  Surgeon: Arnie Lao, MD;  Location: [facility];  Service: Orthopedics;  Laterality: Right;   TUBAL LIGATION     UPPER GASTROINTESTINAL ENDOSCOPY  04/28/2011   Patient Active Problem List   Diagnosis Date Noted   Arthritis of carpometacarpal [facility]) joint of right thumb 03/26/2023   Carpal tunnel syndrome, right upper limb 03/26/2023   Status post revision of total replacement of right knee 05/12/2022   History of colonic polyps 03/20/2022   Failed total knee, right, subsequent encounter 03/02/2022   Loose right total knee arthroplasty (HCC) 03/02/2022   Mixed stress and urge urinary incontinence 12/04/2021   Functional fecal incontinence 12/04/2021   IBS (irritable bowel syndrome) 11/19/2021   Spondylolisthesis, lumbar region    Other spondylosis with radiculopathy, lumbar region    Other secondary scoliosis, lumbar region    Fusion of spine of lumbar region 03/25/2021   Colon polyps 06/06/2020   Lupus 06/06/2020   Allergic rhinitis due to animal (cat) (dog) hair and dander 04/08/2020   Allergic rhinitis due to pollen 04/08/2020   Food allergy  04/08/2020   Acute medial meniscus tear, left, subsequent encounter 03/21/2020   Chronic pain of left knee 02/15/2020   Paresthesia of skin 11/16/2019   History of total knee replacement, right 11/16/2019   Tobacco abuse 11/16/2019   Centrilobular emphysema  (HCC) 08/10/2019   Incidental lung nodule, > 3mm and < 8mm 08/10/2019   OSA on CPAP 08/10/2019   Dyspnea on exertion 08/02/2019   Hyperlipidemia 08/02/2019   Lumbar radiculopathy 04/24/2019   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   Post laminectomy syndrome 02/23/2019   Abnormality of gait 02/23/2019   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   Unilateral primary osteoarthritis, left hip 12/28/2018   Lesion of skin of left ear 12/26/2018   Primary osteoarthritis of left hip 12/02/2018   Iron  deficiency anemia 10/16/2018   Chronic pain syndrome 09/08/2018   Chronic pain of right knee 08/11/2018   Status post lumbar laminectomy 07/15/2018   Peripheral arterial disease (HCC) 04/05/2018   Moderate persistent asthma without complication 06/29/2017   Environmental and seasonal allergies 06/29/2017   Controlled type 2 diabetes mellitus with diabetic polyneuropathy, without long-term current use of insulin  (HCC) 06/29/2017   Perennial allergic rhinitis 04/08/2017   Sensorineural hearing loss (SNHL), bilateral 04/08/2017   Chronic pansinusitis  03/25/2017   Eustachian tube dysfunction, bilateral 03/25/2017   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   Herniation of lumbar intervertebral disc with radiculopathy 10/02/2016    Class: Chronic   Alopecia areata 08/19/2016   Chondromalacia of both patellae 06/03/2015    Class: Chronic   Spinal stenosis, lumbar region, with neurogenic claudication 06/03/2015   Tobacco use disorder 04/25/2015   DJD (degenerative joint disease) of knee 01/04/2015   Hemorrhoid 11/14/2014   Gout of big toe 07/19/2014   Essential hypertension 08/14/2013   Gastroesophageal reflux disease without esophagitis 08/14/2013   COPD (chronic obstructive pulmonary disease) (HCC) 04/17/2011    ONSET DATE: 03/30/2023 (referral date), 03/26/23 (date of surgery, s/p R thumb CMC arthroplasty, R open carpal tunnel release)  REFERRING DIAG: M18.11 (ICD-10-CM) - Arthritis of carpometacarpal  (CMC) joint of right thumb  THERAPY DIAG:  Muscle weakness (generalized)  Other lack of coordination  Other disturbances of skin sensation  Rationale for Evaluation and Treatment: Rehabilitation  SUBJECTIVE:   SUBJECTIVE STATEMENT: I cancelled last week d/t IBS.  Pt now > 8 weeks post op  Pt accompanied by: self  PERTINENT HISTORY: 03/26/23 R thumb CMC arthroplasty and R open carpal tunnel release, s/p R TKA 2024, lupus, L hip total arthroplasty, tobacco abuse, hyperlipidemia, OSA on CPAP, essential HTN, COPD, GERD, HPV   03/30/23 Referral Notes:  Splint fabrication S/p [facility] arthroplasty *Thursday  March 6th after 9 am  PRECAUTIONS: Per 5th Edition [place]  Hand protocol, Fall, Other:    Per 03/26/23 OP Note: The patient will be non weight bearing on the right upper extremity in a spica splint. I will see the patient back in the office in 2 weeks for postoperative followup.   RED FLAGS: Hx of lupus   WEIGHT BEARING RESTRICTIONS: Yes   , per [facility] arthroplasty protocol   PAIN:  Are you having pain? Yes: NPRS scale: 3 out of 10 Pain location: top of Rt thumb Pain description: dull, sore Aggravating factors: moving a certain way or sleeping with hand hanging down Relieving factors: rest, propping hand in bed  FALLS: Has patient fallen in last 6 months? Yes. Number of falls 1 L knee buckled on Friday last week at surgery center, pt reported no injuries, assistance from nurses  to get up  04/29/23 update - Pt reported L knee buckled and pt fell down to the ground. Pt denied injuries though required assistance from family to get up.   LIVING ENVIRONMENT: Lives with: lives with their spouse Lives in: House/apartment Stairs: Yes: Internal: 12 steps; none and External: 2 steps; on right going up and on left going up   PLOF: Independent with basic ADLs, transportation service for community mobility  PATIENT GOALS: "improvement and mobility with the hand"  NEXT MD VISIT:  05/10/23  OBJECTIVE:  Note: Objective measures were completed at Evaluation unless otherwise noted.  HAND DOMINANCE: Left  ADLs: Eating: ind Grooming: denture care, some difficulty using one hand to replace/remove dentures Upper body dressing: ind with pullover shirt with extra time, some pain of affected UE Lower body dressing: ind with extra time, difficulty with zippers d/t using L  hand Toileting: ind Bathing: sponge bathing, difficulty d/t one hand, assistance from spouse to wash back Tub shower transfers: currently sponge bathing  FUNCTIONAL OUTCOME MEASURES: Quick Dash: 90.9% deficit     05/07/23 - 43.2% deficit   UPPER EXTREMITY ROM:     Active ROM Right eval Left eval  Shoulder flexion Unable to fully assess d/t presence of sling  [facility]  Shoulder abduction Unable to fully assess d/t presence of sling   Shoulder adduction    Shoulder extension    Shoulder internal rotation    Shoulder external rotation    Elbow flexion WFL   Elbow extension WFL   Wrist flexion    Wrist extension    Wrist ulnar deviation    Wrist radial deviation    Wrist pronation    Wrist supination    (Blank rows = not tested)  04/02/23 - Unable to assess ROM of affected hand d/t recent surgery and current precautions and presence of bulky compressive dressing.  Active ROM Right eval Left eval  Thumb MCP (0-60)      WFL                           Thumb IP (0-80)    Thumb Radial abd/add (0-55)    Thumb Palmar abd/add (0-45)    Thumb Opposition to Small Finger    Index MCP (0-90)    Index PIP (0-100)    Index DIP (0-70)     Long MCP (0-90)     Long PIP (0-100)     Long DIP (0-70)     Ring MCP (0-90)     Ring PIP (0-100)     Ring DIP (0-70)     Little MCP (0-90)     Little PIP (0-100)     Little DIP (0-70)     (Blank rows = not tested)  RUE 04/08/23 Wrist flex = 55*, ext = 45*  Thumb IP joint = 45* Shoulder, elbow, forearm and hand ROM WFL's  HAND FUNCTION: Grip  strength: Unable to assess secondary to current precautions  COORDINATION: Eval: Unable to assess secondary to current precautions  05/07/23 - 9-hole peg test: LUE - 30 seconds, RUE - 41 seconds  SENSATION: Moderate tingling/numbness in B fingers  EDEMA: Mild in fingers, unable to assess full hand d/t presence of bulky compressive covering  COGNITION: Overall cognitive status: Within functional limits for tasks assessed  OBSERVATIONS: Pt was pleasant and appeared well-kept. Pt currently wearing sling of RUE with affected hand wrapped in Ace wrap as bulky compressive dressing.   TREATMENT DATE:    05/25/23  Fluidotherapy x 12 min Rt hand to decrease pain and stiffness. Therapist provided new straps to splints during this time.   Pt reports doing isometric ex shown last visit and demo today.   Pt issued light strengthening HEP today (yellow resistance putty) as she is now > 8 weeks post-op. Pt return demo of each as indicated  Pt placing pegs in pegboard and then removing Rt hand for light resistance and function  05/25/23:  Thumb MP flexion = 50* Thumb IP flexion = 55* Thumb palmer abd = 60* Thumb radial abd = 70*  05/11/23:  Thumb MP flexion = 47* Thumb IP flexion = 50* Thumb palmer abd = 65* Thumb radial abd = 68*    PATIENT EDUCATION: Education details: light strengthening HEP for Rt hand/thumb (yellow resistance) Person educated: Patient  Education method: Explanation, Demonstration, Verbal cues, and Handouts Education comprehension: verbalized understanding and returned demonstration  HOME EXERCISE PROGRAM: 04/08/23: splint wear and care, hygiene care, precautions, initial A/ROM HEP for RUE (NOT for CMC/MP joint of thumb) 04/23/23 - thumb ROM, Access Code: [email or web address], splint wear schedule per protocol: hand-based splint during day, forearm-based at night.  04/26/23 - thumb palmar ADD (access code: same as above) 04/29/23 -  RUE PROM thumb IP flex in splint, place-and-hold,  hold 10 seconds, 10 reps, 4x per day 05/11/23: thumb isometric ex (per protocol)  05/25/23: light putty HEP   GOALS: Goals reviewed with patient? Yes  SHORT TERM GOALS: Target date: 04/23/23  Pt will obtain protective, custom forearm based thumb spica splint/orthotic and ind recall splint wear/care schedule. Baseline: Pt currently has compressive bulky dressing on RUE with sling. 04/26/23 - Pt ind recalled splint wear/care schedule. Goal status: MET  2.  Pt will demo understanding of initial RUE HEP to improve pain levels and prerequisite motion for functional use of her R hand/thumb. Baseline: new to outpt OT 04/26/23 - Pt continues to benefit from v/c for completion of HEP. 04/29/23 - Pt returned demo of initial RUE HPE with min to no v/c. Goal status: MET  3.  Pt will return demo of scar management techniques and edema management strategies. Baseline: new to outpt OT 04/29/23 - OT initiated scar massage HEP and pt returned demo.  05/03/23 - Pt returned demo of scar massage. Goal status: MET   LONG TERM GOALS: Target date: 06/25/23   Pt will be ind with updated RUE HEP Baseline: new to outpt OT Goal status: in progress  2.  Patient will demonstrate at least 16% improvement with quick Dash score (reporting  27.3% disability or less) indicating improved functional use of affected extremity.  Baseline: 90.9% deficit  05/07/23 - 05/07/23 - 43.2% deficit Goal status: MET and revised on 05/07/23  3.  Pt will improve grip strength in R hand from unable to assess to at least 15 lbs for functional use at home and in ADLs/IADLs per precautions and protocol. Baseline: unable to assess secondary to current precautions Goal status: IN PROGRESS  4.  Pt will improve A/ROM in R thumb to PIP (or greater) of R small finger, to have functional motion for tasks like ADL's, reach and grasp  Baseline: unable to assess AROM secondary to current precautions and presence of compressive bulky dressing. 04/26/23 -  Pt demo'd thumb opposition to R small finger PIP joint. Goal status: MET  5.  Pt will improve R hand functional use to at least be Mod I for basic ADL's (ie, brushing her hair, bathing, brushing her teeth and folding laundry) to assist in ability to carry out self-care and higher-level home-care tasks with less difficulty. Baseline: unable to assess, compensating with L hand for all tasks d/t NWB RUE 05/07/23 - Pt beginning to use R hand for light tasks while wearing splint, such as folding laundry. Goal status: MET  6. Patient will demo improved FM coordination as evidenced by completing nine-hole peg with use of RUE in 40 seconds or less.  Baseline: unable to assess secondary to current precautions and presence of bulky compressive dressing. 05/07/23 - 9-hole peg test: LUE - 30 seconds, RUE - 41 seconds  Goal Status: in progress  ASSESSMENT:  CLINICAL IMPRESSION: Pt has met all STG's and some LTG's. Pt currently > 8 weeks post op.  Pt tolerating tasks well. Pt to begin weaning splint. OT to monitor as pt begins to return to light activities with affected hand. Pt would continue to benefit from skilled OT services in the outpatient setting to work on impairments as noted below to help pt return to PLOF as able.     PERFORMANCE DEFICITS: in functional skills including ADLs, IADLs, coordination, dexterity, proprioception, sensation, edema, ROM, strength, pain, Fine motor control, Gross motor control, mobility, body mechanics, endurance, wound,  skin integrity, and UE functional use, cognitive skills including  none , and psychosocial skills including environmental adaptation.   IMPAIRMENTS: are limiting patient from ADLs, IADLs, rest and sleep, leisure, and social participation.   COMORBIDITIES: may have co-morbidities  that affects occupational performance. Patient will benefit from skilled OT to address above impairments and improve overall function.  MODIFICATION OR ASSISTANCE TO COMPLETE  EVALUATION: Min-Moderate modification of tasks or assist with assess necessary to complete an evaluation.  OT OCCUPATIONAL PROFILE AND HISTORY: Detailed assessment: Review of records and additional review of physical, cognitive, psychosocial history related to current functional performance.  CLINICAL DECISION MAKING: Moderate - several treatment options, min-mod task modification necessary  REHAB POTENTIAL: Good  EVALUATION COMPLEXITY: Moderate      PLAN:  OT FREQUENCY: 1-2x/week   OT DURATION: for up to 6 additional weeks (beginning week of 05/17/23)  PLANNED INTERVENTIONS: 16109 OT Re-evaluation, 97535 self care/ADL training, 60454 therapeutic exercise, 97530 therapeutic activity, 97112 neuromuscular re-education, 97140 manual therapy, 97035 ultrasound, 97018 paraffin, 09811 fluidotherapy, 97010 moist heat, 97010 cryotherapy, 97760 Orthotics management and training, 91478 Splinting (initial encounter), [id number] Subsequent splinting/medication, passive range of motion, functional mobility training, energy conservation, patient/family education, and DME and/or AE instructions  RECOMMENDED OTHER SERVICES: N/A  CONSULTED AND AGREED WITH PLAN OF CARE: Patient  PLAN FOR NEXT SESSION:  Continue fluido, continue light strengthening and functional use of hand, light gripper activity and clothespins activity    For all possible CPT codes, reference the Planned Interventions line above.     Check all conditions that are expected to impact treatment: {Conditions expected to impact treatment:Musculoskeletal disorders and Active major medical illness  (Lupus)  If treatment provided at initial evaluation, no treatment charged due to lack of authorization.        Velinda Getting, OT 05/25/2023, 8:12 AM

## 2023-05-25 NOTE — Patient Instructions (Signed)
 1. Grip Strengthening (Resistive Putty)   Squeeze putty using thumb and all fingers. Repeat _20___ times. Do __2__ sessions per day.   2. Roll putty into tube on table and pinch between first two fingers and thumb x 10 reps. Do 2 sessions per day  3. Thumb Opposition: Resisted    With rubber band around right thumb, hold other end with other hand. Rotate thumb up and over toward little finger. Repeat toward each finger. Repeat _10___ times per set.  Do __2__ sessions per day.       Copyright  VHI. All rights reserved.

## 2023-05-27 ENCOUNTER — Ambulatory Visit: Admitting: Occupational Therapy

## 2023-05-27 DIAGNOSIS — F331 Major depressive disorder, recurrent, moderate: Secondary | ICD-10-CM | POA: Diagnosis not present

## 2023-05-31 ENCOUNTER — Other Ambulatory Visit: Payer: Self-pay | Admitting: Internal Medicine

## 2023-05-31 ENCOUNTER — Other Ambulatory Visit: Payer: Self-pay | Admitting: Physician Assistant

## 2023-05-31 DIAGNOSIS — E1142 Type 2 diabetes mellitus with diabetic polyneuropathy: Secondary | ICD-10-CM

## 2023-05-31 DIAGNOSIS — M109 Gout, unspecified: Secondary | ICD-10-CM

## 2023-06-01 ENCOUNTER — Other Ambulatory Visit: Payer: Self-pay | Admitting: Internal Medicine

## 2023-06-01 ENCOUNTER — Ambulatory Visit: Admitting: Occupational Therapy

## 2023-06-01 ENCOUNTER — Encounter: Admitting: Occupational Therapy

## 2023-06-01 ENCOUNTER — Encounter: Payer: Self-pay | Admitting: Occupational Therapy

## 2023-06-01 DIAGNOSIS — R208 Other disturbances of skin sensation: Secondary | ICD-10-CM

## 2023-06-01 DIAGNOSIS — M6281 Muscle weakness (generalized): Secondary | ICD-10-CM

## 2023-06-01 DIAGNOSIS — F419 Anxiety disorder, unspecified: Secondary | ICD-10-CM

## 2023-06-01 DIAGNOSIS — R278 Other lack of coordination: Secondary | ICD-10-CM

## 2023-06-01 DIAGNOSIS — R29898 Other symptoms and signs involving the musculoskeletal system: Secondary | ICD-10-CM

## 2023-06-01 NOTE — Therapy (Signed)
 OUTPATIENT OCCUPATIONAL THERAPY ORTHO TREATMENT  Patient Name: Stacy [name] [facility] MRN: [id number] DOB:10-12-1960, 63 y.o., female Today's Date: 06/01/2023  PCP: Lawrance Presume, MD  REFERRING PROVIDER: Merrill Abide, MD   END OF SESSION:  OT End of Session - 06/01/23 0806     Visit Number 12    Number of Visits 18   including eval   Date for OT Re-Evaluation 06/25/23    Authorization Type [employer]    OT Start Time 0802    OT Stop Time 0845    OT Time Calculation (min) 43 min    Activity Tolerance Patient tolerated treatment well    Behavior During Therapy WFL for tasks assessed/performed                    Past Medical History:  Diagnosis Date   Allergy     Shellfish, cleaning products   Anxiety    Arthritis    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   Asthma    COPD (chronic obstructive pulmonary disease) (HCC)    Depression    Diabetes mellitus    Type II   GERD (gastroesophageal reflux disease)    Pt on Protonix  daily   Glaucoma    Gout    Headache(784.0)    otc meds prn   Hyperlipidemia    Hypertension    Irritable bowel syndrome 11/19/2010   Neuropathy    Pneumonia YRS AGO   Restless legs    Shortness of breath    07/14/2018- uses  4 times a day   Sleep apnea    Past Surgical History:  Procedure Laterality Date   BACK SURGERY     feb 21, 23   CARPAL TUNNEL RELEASE Right 03/26/2023   Procedure: RIGHT CARPAL TUNNEL RELEASE;  Surgeon: Merrill Abide, MD;  Location: [facility];  Service: Orthopedics;  Laterality: Right;   CARPOMETACARPEL SUSPENSION PLASTY Right 03/26/2023   Procedure: RIGHT THUMB CARPOMETACARPEL (CMC)  ARTHROPLASTY WITH INTERNAL BRACE;  Surgeon: Merrill Abide, MD;  Location: [facility];  Service: Orthopedics;  Laterality: Right;   CHOLECYSTECTOMY     COLONOSCOPY     ENDOMETRIAL ABLATION  10/2010   HERNIA REPAIR     umbicial hernia   JOINT REPLACEMENT Left  03/14/2019   Dr. Lucienne Ryder hip   KNEE ARTHROSCOPY Left    06/07/2017 Dr. Rozelle [facility]   KNEE ARTHROSCOPY Left 03/21/2020   Procedure: LEFT KNEE ARTHROSCOPY WITH PARTIAL MEDIAL MENISCECTOMY;  Surgeon: Arnie Lao, MD;  Location: [facility];  Service: Orthopedics;  Laterality: Left;   KNEE CLOSED REDUCTION Right 12/06/2015   Procedure: CLOSED MANIPULATION RIGHT KNEE;  Surgeon: Alphonso Jean, MD;  Location: [facility];  Service: Orthopedics;  Laterality: Right;   KNEE CLOSED REDUCTION Right 01/17/2016   Procedure: CLOSED MANIPULATION RIGHT KNEE;  Surgeon: Alphonso Jean, MD;  Location: [facility];  Service: Orthopedics;  Laterality: Right;   KNEE JOINT MANIPULATION Right 12/06/2015   LACRIMAL TUBE INSERTION Bilateral 03/01/2019   Procedure: LACRIMAL TUBE INSERTION;  Surgeon: Lorena Rolling, MD;  Location: [facility];  Service: Ophthalmology;  Laterality: Bilateral;   LACRIMAL TUBE REMOVAL Bilateral 05/03/2019   Procedure: BILATERAL NASOLACRIMAL DUCT PROBING, IIRIGATION AND TUBE REMOVAL BOTH EYES;  Surgeon: Lorena Rolling, MD;  Location: [facility];  Service: Ophthalmology;  Laterality: Bilateral;   LUMBAR LAMINECTOMY/DECOMPRESSION MICRODISCECTOMY N/A 06/03/2015   Procedure: Bilateral lateral recess decompression L2-3, L3-4,  L4-5;  Surgeon: Alphonso Jean, MD;  Location: [facility] OR;  Service: Orthopedics;  Laterality: N/A;   LUMBAR LAMINECTOMY/DECOMPRESSION MICRODISCECTOMY N/A 10/02/2016   Procedure: Right L5-S1 Lateral Recess Decompression  microdiscectomy;  Surgeon: Alphonso Jean, MD;  Location: [facility] OR;  Service: Orthopedics;  Laterality: N/A;   LUMBAR LAMINECTOMY/DECOMPRESSION MICRODISCECTOMY N/A 07/15/2018   Procedure: LEFT L3-4 MICRODISCECTOMY;  Surgeon: Alphonso Jean, MD;  Location: [facility] OR;  Service: Orthopedics;  Laterality: N/A;   svd      x 2   TEAR DUCT PROBING Bilateral 03/01/2019   Procedure: TEAR DUCT PROBING WITH IRRIGATION;   Surgeon: Lorena Rolling, MD;  Location: [facility];  Service: Ophthalmology;  Laterality: Bilateral;   TOTAL HIP ARTHROPLASTY Left 03/14/2019   Procedure: LEFT TOTAL HIP ARTHROPLASTY ANTERIOR APPROACH;  Surgeon: Arnie Lao, MD;  Location: [facility];  Service: Orthopedics;  Laterality: Left;   TOTAL KNEE ARTHROPLASTY Right 09/06/2015   Procedure: RIGHT TOTAL KNEE ARTHROPLASTY;  Surgeon: Alphonso Jean, MD;  Location: [facility];  Service: Orthopedics;  Laterality: Right;   TOTAL KNEE REVISION Right 05/12/2022   Procedure: RIGHT TOTAL KNEE REVISION ARTHROPLASTY;  Surgeon: Arnie Lao, MD;  Location: [facility];  Service: Orthopedics;  Laterality: Right;   TUBAL LIGATION     UPPER GASTROINTESTINAL ENDOSCOPY  04/28/2011   Patient Active Problem List   Diagnosis Date Noted   Arthritis of carpometacarpal [facility]) joint of right thumb 03/26/2023   Carpal tunnel syndrome, right upper limb 03/26/2023   Status post revision of total replacement of right knee 05/12/2022   History of colonic polyps 03/20/2022   Failed total knee, right, subsequent encounter 03/02/2022   Loose right total knee arthroplasty (HCC) 03/02/2022   Mixed stress and urge urinary incontinence 12/04/2021   Functional fecal incontinence 12/04/2021   IBS (irritable bowel syndrome) 11/19/2021   Spondylolisthesis, lumbar region    Other spondylosis with radiculopathy, lumbar region    Other secondary scoliosis, lumbar region    Fusion of spine of lumbar region 03/25/2021   Colon polyps 06/06/2020   Lupus 06/06/2020   Allergic rhinitis due to animal (cat) (dog) hair and dander 04/08/2020   Allergic rhinitis due to pollen 04/08/2020   Food allergy  04/08/2020   Acute medial meniscus tear, left, subsequent encounter 03/21/2020   Chronic pain of left knee 02/15/2020   Paresthesia of skin 11/16/2019   History of total knee replacement, right 11/16/2019   Tobacco abuse 11/16/2019   Centrilobular emphysema  (HCC) 08/10/2019   Incidental lung nodule, > 3mm and < 8mm 08/10/2019   OSA on CPAP 08/10/2019   Dyspnea on exertion 08/02/2019   Hyperlipidemia 08/02/2019   Lumbar radiculopathy 04/24/2019   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   Post laminectomy syndrome 02/23/2019   Abnormality of gait 02/23/2019   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   Unilateral primary osteoarthritis, left hip 12/28/2018   Lesion of skin of left ear 12/26/2018   Primary osteoarthritis of left hip 12/02/2018   Iron  deficiency anemia 10/16/2018   Chronic pain syndrome 09/08/2018   Chronic pain of right knee 08/11/2018   Status post lumbar laminectomy 07/15/2018   Peripheral arterial disease (HCC) 04/05/2018   Moderate persistent asthma without complication 06/29/2017   Environmental and seasonal allergies 06/29/2017   Controlled type 2 diabetes mellitus with diabetic polyneuropathy, without long-term current use of insulin  (HCC) 06/29/2017   Perennial allergic rhinitis 04/08/2017   Sensorineural hearing loss (SNHL), bilateral 04/08/2017   Chronic pansinusitis  03/25/2017   Eustachian tube dysfunction, bilateral 03/25/2017   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   Herniation of lumbar intervertebral disc with radiculopathy 10/02/2016    Class: Chronic   Alopecia areata 08/19/2016   Chondromalacia of both patellae 06/03/2015    Class: Chronic   Spinal stenosis, lumbar region, with neurogenic claudication 06/03/2015   Tobacco use disorder 04/25/2015   DJD (degenerative joint disease) of knee 01/04/2015   Hemorrhoid 11/14/2014   Gout of big toe 07/19/2014   Essential hypertension 08/14/2013   Gastroesophageal reflux disease without esophagitis 08/14/2013   COPD (chronic obstructive pulmonary disease) (HCC) 04/17/2011    ONSET DATE: 03/30/2023 (referral date), 03/26/23 (date of surgery, s/p R thumb CMC arthroplasty, R open carpal tunnel release)  REFERRING DIAG: M18.11 (ICD-10-CM) - Arthritis of carpometacarpal  (CMC) joint of right thumb  THERAPY DIAG:  Muscle weakness (generalized)  Other lack of coordination  Other disturbances of skin sensation  Other symptoms and signs involving the musculoskeletal system  Rationale for Evaluation and Treatment: Rehabilitation  SUBJECTIVE:   SUBJECTIVE STATEMENT: Pt reports no pain now but some soreness occasionally. Pt now 9.5 weeks post-op Pt accompanied by: self  PERTINENT HISTORY: 03/26/23 R thumb CMC arthroplasty and R open carpal tunnel release, s/p R TKA 2024, lupus, L hip total arthroplasty, tobacco abuse, hyperlipidemia, OSA on CPAP, essential HTN, COPD, GERD, HPV   03/30/23 Referral Notes:  Splint fabrication S/p [facility] arthroplasty *Thursday  March 6th after 9 am  PRECAUTIONS: Per 5th Edition [place]  Hand protocol, Fall, Other:    Per 03/26/23 OP Note: The patient will be non weight bearing on the right upper extremity in a spica splint. I will see the patient back in the office in 2 weeks for postoperative followup.   RED FLAGS: Hx of lupus   WEIGHT BEARING RESTRICTIONS: Yes   , per [facility] arthroplasty protocol   PAIN:  Are you having pain? Yes: NPRS scale: 3 out of 10 Pain location: top of Rt thumb Pain description: dull, sore Aggravating factors: moving a certain way or sleeping with hand hanging down Relieving factors: rest, propping hand in bed  FALLS: Has patient fallen in last 6 months? Yes. Number of falls 1 L knee buckled on Friday last week at surgery center, pt reported no injuries, assistance from nurses  to get up  04/29/23 update - Pt reported L knee buckled and pt fell down to the ground. Pt denied injuries though required assistance from family to get up.   LIVING ENVIRONMENT: Lives with: lives with their spouse Lives in: House/apartment Stairs: Yes: Internal: 12 steps; none and External: 2 steps; on right going up and on left going up   PLOF: Independent with basic ADLs, transportation service for community  mobility  PATIENT GOALS: "improvement and mobility with the hand"  NEXT MD VISIT: 05/10/23  OBJECTIVE:  Note: Objective measures were completed at Evaluation unless otherwise noted.  HAND DOMINANCE: Left  ADLs: Eating: ind Grooming: denture care, some difficulty using one hand to replace/remove dentures Upper body dressing: ind with pullover shirt with extra time, some pain of affected UE Lower body dressing: ind with extra time, difficulty with zippers d/t using L  hand Toileting: ind Bathing: sponge bathing, difficulty d/t one hand, assistance from spouse to wash back Tub shower transfers: currently sponge bathing  FUNCTIONAL OUTCOME MEASURES: Quick Dash: 90.9% deficit     05/07/23 - 43.2% deficit   UPPER EXTREMITY ROM:     Active ROM Right eval Left eval  Shoulder flexion  Unable to fully assess d/t presence of sling [facility]  Shoulder abduction Unable to fully assess d/t presence of sling   Shoulder adduction    Shoulder extension    Shoulder internal rotation    Shoulder external rotation    Elbow flexion WFL   Elbow extension [facility]   Wrist flexion    Wrist extension    Wrist ulnar deviation    Wrist radial deviation    Wrist pronation    Wrist supination    (Blank rows = not tested)  04/02/23 - Unable to assess ROM of affected hand d/t recent surgery and current precautions and presence of bulky compressive dressing.  Active ROM Right eval Left eval  Thumb MCP (0-60)      WFL                           Thumb IP (0-80)    Thumb Radial abd/add (0-55)    Thumb Palmar abd/add (0-45)    Thumb Opposition to Small Finger    Index MCP (0-90)    Index PIP (0-100)    Index DIP (0-70)     Long MCP (0-90)     Long PIP (0-100)     Long DIP (0-70)     Ring MCP (0-90)     Ring PIP (0-100)     Ring DIP (0-70)     Little MCP (0-90)     Little PIP (0-100)     Little DIP (0-70)     (Blank rows = not tested)  RUE 04/08/23 Wrist flex = 55*, ext = 45*  Thumb  IP joint = 45* Shoulder, elbow, forearm and hand ROM WFL's  HAND FUNCTION: Grip strength: Unable to assess secondary to current precautions  COORDINATION: Eval: Unable to assess secondary to current precautions  05/07/23 - 9-hole peg test: LUE - 30 seconds, RUE - 41 seconds  SENSATION: Moderate tingling/numbness in B fingers  EDEMA: Mild in fingers, unable to assess full hand d/t presence of bulky compressive covering  COGNITION: Overall cognitive status: Within functional limits for tasks assessed  OBSERVATIONS: Pt was pleasant and appeared well-kept. Pt currently wearing sling of RUE with affected hand wrapped in Ace wrap as bulky compressive dressing.   TREATMENT DATE:    06/01/23  Reviewed putty and rubberband ex's from last session. Pt was doing last 2 ex's incorrectly and required mod cueing to perform correctly. Pt then returned demo correctly after review/cues.   Pt encouraged to continue weaning from daytime splint for all BADLS and lighter tasks. Pt also shown neoprene CMC splint she can wear instead of fabricated one at this time. Fitted her for correct size and printed handout for her to order if interested. Pt also given alternative ways to purchase at local drugstore if available.   Gripper set at level 1 resistance to pick up blocks RT hand for sustained grip strength and function.   Clothespins activity for 3 tip pinch (NOT lateral pinch) to place and remove from antennae - yellow and red resistance only   05/25/23:  Thumb MP flexion = 50* Thumb IP flexion = 55* Thumb palmer abd = 60* Thumb radial abd = 70*  05/11/23:  Thumb MP flexion = 47* Thumb IP flexion = 50* Thumb palmer abd = 65* Thumb radial abd = 68*    PATIENT EDUCATION: Education details: light strengthening HEP for Rt hand/thumb (yellow resistance) Person educated: Patient  Education method: Explanation, Demonstration, Verbal  cues, and Handouts Education comprehension: verbalized understanding  and returned demonstration  HOME EXERCISE PROGRAM: 04/08/23: splint wear and care, hygiene care, precautions, initial A/ROM HEP for RUE (NOT for CMC/MP joint of thumb) 04/23/23 - thumb ROM, Access Code: [email or web address], splint wear schedule per protocol: hand-based splint during day, forearm-based at night.  04/26/23 - thumb palmar ADD (access code: same as above) 04/29/23 - RUE PROM thumb IP flex in splint, place-and-hold, hold 10 seconds, 10 reps, 4x per day 05/11/23: thumb isometric ex (per protocol)  05/25/23: light putty HEP   GOALS: Goals reviewed with patient? Yes  SHORT TERM GOALS: Target date: 04/23/23  Pt will obtain protective, custom forearm based thumb spica splint/orthotic and ind recall splint wear/care schedule. Baseline: Pt currently has compressive bulky dressing on RUE with sling. 04/26/23 - Pt ind recalled splint wear/care schedule. Goal status: MET  2.  Pt will demo understanding of initial RUE HEP to improve pain levels and prerequisite motion for functional use of her R hand/thumb. Baseline: new to outpt OT 04/26/23 - Pt continues to benefit from v/c for completion of HEP. 04/29/23 - Pt returned demo of initial RUE HPE with min to no v/c. Goal status: MET  3.  Pt will return demo of scar management techniques and edema management strategies. Baseline: new to outpt OT 04/29/23 - OT initiated scar massage HEP and pt returned demo.  05/03/23 - Pt returned demo of scar massage. Goal status: MET   LONG TERM GOALS: Target date: 06/25/23   Pt will be ind with updated RUE HEP Baseline: new to outpt OT Goal status: MET  2.  Patient will demonstrate at least 16% improvement with quick Dash score (reporting  27.3% disability or less) indicating improved functional use of affected extremity.  Baseline: 90.9% deficit  05/07/23 - 05/07/23 - 43.2% deficit Goal status: MET and revised on 05/07/23  3.  Pt will improve grip strength in R hand from unable to assess to at least 15 lbs for  functional use at home and in ADLs/IADLs per precautions and protocol. Baseline: unable to assess secondary to current precautions Goal status: IN PROGRESS  4.  Pt will improve A/ROM in R thumb to PIP (or greater) of R small finger, to have functional motion for tasks like ADL's, reach and grasp  Baseline: unable to assess AROM secondary to current precautions and presence of compressive bulky dressing. 04/26/23 - Pt demo'd thumb opposition to R small finger PIP joint. Goal status: MET  5.  Pt will improve R hand functional use to at least be Mod I for basic ADL's (ie, brushing her hair, bathing, brushing her teeth and folding laundry) to assist in ability to carry out self-care and higher-level home-care tasks with less difficulty. Baseline: unable to assess, compensating with L hand for all tasks d/t NWB RUE 05/07/23 - Pt beginning to use R hand for light tasks while wearing splint, such as folding laundry. Goal status: MET  6. Patient will demo improved FM coordination as evidenced by completing nine-hole peg with use of RUE in 40 seconds or less.  Baseline: unable to assess secondary to current precautions and presence of bulky compressive dressing. 05/07/23 - 9-hole peg test: LUE - 30 seconds, RUE - 41 seconds  Goal Status: in progress  ASSESSMENT:  CLINICAL IMPRESSION: Pt has met all STG's and some LTG's. Pt currently 9.5 weeks post op.  Pt tolerating tasks well. Pt weaning splint per protocol.  Pt would continue to benefit from skilled OT services  in the outpatient setting to work on remaining impairments and goals. Pt approaching d/c in next 2 visits.   PERFORMANCE DEFICITS: in functional skills including ADLs, IADLs, coordination, dexterity, proprioception, sensation, edema, ROM, strength, pain, Fine motor control, Gross motor control, mobility, body mechanics, endurance, wound, skin integrity, and UE functional use, cognitive skills including  none , and psychosocial skills including  environmental adaptation.   IMPAIRMENTS: are limiting patient from ADLs, IADLs, rest and sleep, leisure, and social participation.   COMORBIDITIES: may have co-morbidities  that affects occupational performance. Patient will benefit from skilled OT to address above impairments and improve overall function.  MODIFICATION OR ASSISTANCE TO COMPLETE EVALUATION: Min-Moderate modification of tasks or assist with assess necessary to complete an evaluation.  OT OCCUPATIONAL PROFILE AND HISTORY: Detailed assessment: Review of records and additional review of physical, cognitive, psychosocial history related to current functional performance.  CLINICAL DECISION MAKING: Moderate - several treatment options, min-mod task modification necessary  REHAB POTENTIAL: Good  EVALUATION COMPLEXITY: Moderate      PLAN:  OT FREQUENCY: 1-2x/week   OT DURATION: for up to 6 additional weeks (beginning week of 05/17/23)  PLANNED INTERVENTIONS: 91478 OT Re-evaluation, 97535 self care/ADL training, 29562 therapeutic exercise, 97530 therapeutic activity, 97112 neuromuscular re-education, 97140 manual therapy, 97035 ultrasound, 97018 paraffin, 13086 fluidotherapy, 97010 moist heat, 97010 cryotherapy, 97760 Orthotics management and training, 57846 Splinting (initial encounter), [id number] Subsequent splinting/medication, passive range of motion, functional mobility training, energy conservation, patient/family education, and DME and/or AE instructions  RECOMMENDED OTHER SERVICES: N/A  CONSULTED AND AGREED WITH PLAN OF CARE: Patient  PLAN FOR NEXT SESSION:  continue light strengthening and functional use of hand    For all possible CPT codes, reference the Planned Interventions line above.     Check all conditions that are expected to impact treatment: {Conditions expected to impact treatment:Musculoskeletal disorders and Active major medical illness  (Lupus)  If treatment provided at initial evaluation, no  treatment charged due to lack of authorization.        Velinda Getting, OT 06/01/2023, 8:06 AM

## 2023-06-02 ENCOUNTER — Other Ambulatory Visit (HOSPITAL_COMMUNITY): Payer: Self-pay

## 2023-06-02 ENCOUNTER — Other Ambulatory Visit: Payer: Self-pay

## 2023-06-03 DIAGNOSIS — R32 Unspecified urinary incontinence: Secondary | ICD-10-CM | POA: Diagnosis not present

## 2023-06-03 DIAGNOSIS — I10 Essential (primary) hypertension: Secondary | ICD-10-CM | POA: Diagnosis not present

## 2023-06-07 ENCOUNTER — Telehealth: Payer: Self-pay | Admitting: Internal Medicine

## 2023-06-07 NOTE — Telephone Encounter (Signed)
 Patient phone number updated to [phone].  Copied from CRM [id number]. Topic: General - Other >> Jun 07, 2023  2:06 PM Oddis Bench wrote: Reason for CRM: Patient is calling to update her phone number [phone]

## 2023-06-08 ENCOUNTER — Ambulatory Visit: Attending: Orthopedic Surgery | Admitting: Occupational Therapy

## 2023-06-08 DIAGNOSIS — M6281 Muscle weakness (generalized): Secondary | ICD-10-CM | POA: Insufficient documentation

## 2023-06-08 DIAGNOSIS — R29898 Other symptoms and signs involving the musculoskeletal system: Secondary | ICD-10-CM | POA: Diagnosis present

## 2023-06-08 DIAGNOSIS — R278 Other lack of coordination: Secondary | ICD-10-CM | POA: Diagnosis present

## 2023-06-08 DIAGNOSIS — R208 Other disturbances of skin sensation: Secondary | ICD-10-CM | POA: Insufficient documentation

## 2023-06-08 NOTE — Therapy (Signed)
 OUTPATIENT OCCUPATIONAL THERAPY ORTHO TREATMENT  Patient Name: [name] MRN: [id number] DOB:[birth year], 63 y.o., female Today's Date: 06/08/2023  PCP: Lawrance Presume, MD  REFERRING PROVIDER: Merrill Abide, MD   END OF SESSION:  OT End of Session - 06/08/23 1853     Visit Number 13    Number of Visits 18   including eval   Date for OT Re-Evaluation 06/25/23    Authorization Type [employer]    OT Start Time 0805    OT Stop Time 0845    OT Time Calculation (min) 40 min    Activity Tolerance Patient tolerated treatment well    Behavior During Therapy WFL for tasks assessed/performed                     Past Medical History:  Diagnosis Date   Allergy     Shellfish, cleaning products   Anxiety    Arthritis    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   Asthma    COPD (chronic obstructive pulmonary disease) (HCC)    Depression    Diabetes mellitus    Type II   GERD (gastroesophageal reflux disease)    Pt on Protonix  daily   Glaucoma    Gout    Headache(784.0)    otc meds prn   Hyperlipidemia    Hypertension    Irritable bowel syndrome 11/19/2010   Neuropathy    Pneumonia YRS AGO   Restless legs    Shortness of breath    07/14/2018- uses  4 times a day   Sleep apnea    Past Surgical History:  Procedure Laterality Date   BACK SURGERY     feb 21, 23   CARPAL TUNNEL RELEASE Right 03/26/2023   Procedure: RIGHT CARPAL TUNNEL RELEASE;  Surgeon: Merrill Abide, MD;  Location: [facility];  Service: Orthopedics;  Laterality: Right;   CARPOMETACARPEL SUSPENSION PLASTY Right 03/26/2023   Procedure: RIGHT THUMB CARPOMETACARPEL (CMC)  ARTHROPLASTY WITH INTERNAL BRACE;  Surgeon: Merrill Abide, MD;  Location: [facility];  Service: Orthopedics;  Laterality: Right;   CHOLECYSTECTOMY     COLONOSCOPY     ENDOMETRIAL ABLATION  10/2010   HERNIA REPAIR     umbicial hernia   JOINT REPLACEMENT Left  03/14/2019   Dr. Lucienne Ryder hip   KNEE ARTHROSCOPY Left    06/07/2017 Dr. Rozelle [facility]   KNEE ARTHROSCOPY Left 03/21/2020   Procedure: LEFT KNEE ARTHROSCOPY WITH PARTIAL MEDIAL MENISCECTOMY;  Surgeon: Arnie Lao, MD;  Location: [facility];  Service: Orthopedics;  Laterality: Left;   KNEE CLOSED REDUCTION Right 12/06/2015   Procedure: CLOSED MANIPULATION RIGHT KNEE;  Surgeon: Alphonso Jean, MD;  Location: [facility];  Service: Orthopedics;  Laterality: Right;   KNEE CLOSED REDUCTION Right 01/17/2016   Procedure: CLOSED MANIPULATION RIGHT KNEE;  Surgeon: Alphonso Jean, MD;  Location: [facility];  Service: Orthopedics;  Laterality: Right;   KNEE JOINT MANIPULATION Right 12/06/2015   LACRIMAL TUBE INSERTION Bilateral 03/01/2019   Procedure: LACRIMAL TUBE INSERTION;  Surgeon: Lorena Rolling, MD;  Location: [facility];  Service: Ophthalmology;  Laterality: Bilateral;   LACRIMAL TUBE REMOVAL Bilateral 05/03/2019   Procedure: BILATERAL NASOLACRIMAL DUCT PROBING, IIRIGATION AND TUBE REMOVAL BOTH EYES;  Surgeon: Lorena Rolling, MD;  Location: [facility];  Service: Ophthalmology;  Laterality: Bilateral;   LUMBAR LAMINECTOMY/DECOMPRESSION MICRODISCECTOMY N/A 06/03/2015   Procedure: Bilateral lateral recess decompression L2-3,  L3-4, L4-5;  Surgeon: Alphonso Jean, MD;  Location: [facility];  Service: Orthopedics;  Laterality: N/A;   LUMBAR LAMINECTOMY/DECOMPRESSION MICRODISCECTOMY N/A 10/02/2016   Procedure: Right L5-S1 Lateral Recess Decompression  microdiscectomy;  Surgeon: Alphonso Jean, MD;  Location: [facility] OR;  Service: Orthopedics;  Laterality: N/A;   LUMBAR LAMINECTOMY/DECOMPRESSION MICRODISCECTOMY N/A 07/15/2018   Procedure: LEFT L3-4 MICRODISCECTOMY;  Surgeon: Alphonso Jean, MD;  Location: [facility] OR;  Service: Orthopedics;  Laterality: N/A;   svd      x 2   TEAR DUCT PROBING Bilateral 03/01/2019   Procedure: TEAR DUCT PROBING WITH IRRIGATION;   Surgeon: Lorena Rolling, MD;  Location: [facility];  Service: Ophthalmology;  Laterality: Bilateral;   TOTAL HIP ARTHROPLASTY Left 03/14/2019   Procedure: LEFT TOTAL HIP ARTHROPLASTY ANTERIOR APPROACH;  Surgeon: Arnie Lao, MD;  Location: [facility];  Service: Orthopedics;  Laterality: Left;   TOTAL KNEE ARTHROPLASTY Right 09/06/2015   Procedure: RIGHT TOTAL KNEE ARTHROPLASTY;  Surgeon: Alphonso Jean, MD;  Location: [facility];  Service: Orthopedics;  Laterality: Right;   TOTAL KNEE REVISION Right 05/12/2022   Procedure: RIGHT TOTAL KNEE REVISION ARTHROPLASTY;  Surgeon: Arnie Lao, MD;  Location: [facility];  Service: Orthopedics;  Laterality: Right;   TUBAL LIGATION     UPPER GASTROINTESTINAL ENDOSCOPY  04/28/2011   Patient Active Problem List   Diagnosis Date Noted   Arthritis of carpometacarpal [facility]) joint of right thumb 03/26/2023   Carpal tunnel syndrome, right upper limb 03/26/2023   Status post revision of total replacement of right knee 05/12/2022   History of colonic polyps 03/20/2022   Failed total knee, right, subsequent encounter 03/02/2022   Loose right total knee arthroplasty (HCC) 03/02/2022   Mixed stress and urge urinary incontinence 12/04/2021   Functional fecal incontinence 12/04/2021   IBS (irritable bowel syndrome) 11/19/2021   Spondylolisthesis, lumbar region    Other spondylosis with radiculopathy, lumbar region    Other secondary scoliosis, lumbar region    Fusion of spine of lumbar region 03/25/2021   Colon polyps 06/06/2020   Lupus 06/06/2020   Allergic rhinitis due to animal (cat) (dog) hair and dander 04/08/2020   Allergic rhinitis due to pollen 04/08/2020   Food allergy  04/08/2020   Acute medial meniscus tear, left, subsequent encounter 03/21/2020   Chronic pain of left knee 02/15/2020   Paresthesia of skin 11/16/2019   History of total knee replacement, right 11/16/2019   Tobacco abuse 11/16/2019   Centrilobular emphysema  (HCC) 08/10/2019   Incidental lung nodule, > 3mm and < 8mm 08/10/2019   OSA on CPAP 08/10/2019   Dyspnea on exertion 08/02/2019   Hyperlipidemia 08/02/2019   Lumbar radiculopathy 04/24/2019   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   Post laminectomy syndrome 02/23/2019   Abnormality of gait 02/23/2019   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   Unilateral primary osteoarthritis, left hip 12/28/2018   Lesion of skin of left ear 12/26/2018   Primary osteoarthritis of left hip 12/02/2018   Iron  deficiency anemia 10/16/2018   Chronic pain syndrome 09/08/2018   Chronic pain of right knee 08/11/2018   Status post lumbar laminectomy 07/15/2018   Peripheral arterial disease (HCC) 04/05/2018   Moderate persistent asthma without complication 06/29/2017   Environmental and seasonal allergies 06/29/2017   Controlled type 2 diabetes mellitus with diabetic polyneuropathy, without long-term current use of insulin  (HCC) 06/29/2017   Perennial allergic rhinitis 04/08/2017   Sensorineural hearing loss (SNHL), bilateral 04/08/2017   Chronic  pansinusitis 03/25/2017   Eustachian tube dysfunction, bilateral 03/25/2017   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   Herniation of lumbar intervertebral disc with radiculopathy 10/02/2016    Class: Chronic   Alopecia areata 08/19/2016   Chondromalacia of both patellae 06/03/2015    Class: Chronic   Spinal stenosis, lumbar region, with neurogenic claudication 06/03/2015   Tobacco use disorder 04/25/2015   DJD (degenerative joint disease) of knee 01/04/2015   Hemorrhoid 11/14/2014   Gout of big toe 07/19/2014   Essential hypertension 08/14/2013   Gastroesophageal reflux disease without esophagitis 08/14/2013   COPD (chronic obstructive pulmonary disease) (HCC) 04/17/2011    ONSET DATE: 03/30/2023 (referral date), 03/26/23 (date of surgery, s/p R thumb CMC arthroplasty, R open carpal tunnel release)  REFERRING DIAG: M18.11 (ICD-10-CM) - Arthritis of carpometacarpal  (CMC) joint of right thumb  THERAPY DIAG:  Muscle weakness (generalized)  Other lack of coordination  Other disturbances of skin sensation  Other symptoms and signs involving the musculoskeletal system  Rationale for Evaluation and Treatment: Rehabilitation  SUBJECTIVE:   SUBJECTIVE STATEMENT: Pt reported upcoming appointment with Dr. Merlinda Starling on 519/25. Pt reported recent fall d/t L knee buckling. OT asked if pt had f/u with doctor about L knee buckling since this is second fall within a few months related to L knee buckling based on pt report. Pt reported intent to f/u with Dr. Lucienne Ryder about knee concerns. OT recommended f/u with doctor soon. Pt acknowledged understanding.  Pt accompanied by: self  PERTINENT HISTORY: 03/26/23 R thumb CMC arthroplasty and R open carpal tunnel release, s/p R TKA 2024, lupus, L hip total arthroplasty, tobacco abuse, hyperlipidemia, OSA on CPAP, essential HTN, COPD, GERD, HPV   03/30/23 Referral Notes:  Splint fabrication S/p [facility] arthroplasty *Thursday  March 6th after 9 am  PRECAUTIONS: Per 5th Edition [place]  Hand protocol, Fall, Other:    Per 03/26/23 OP Note: The patient will be non weight bearing on the right upper extremity in a spica splint. I will see the patient back in the office in 2 weeks for postoperative followup.   RED FLAGS: Hx of lupus   WEIGHT BEARING RESTRICTIONS: Yes   , per [facility] arthroplasty protocol   PAIN:  Are you having pain? 0 / 10 pain level.  FALLS: Has patient fallen in last 6 months? Yes. Number of falls 1 L knee buckled on Friday last week at surgery center, pt reported no injuries, assistance from nurses  to get up  04/29/23 update - Pt reported L knee buckled and pt fell down to the ground. Pt denied injuries though required assistance from family to get up.   06/08/23 - Pt fell after church: L knee buckled.   LIVING ENVIRONMENT: Lives with: lives with their spouse Lives in: House/apartment Stairs: Yes: Internal: 12  steps; none and External: 2 steps; on right going up and on left going up   PLOF: Independent with basic ADLs, transportation service for community mobility  PATIENT GOALS: "improvement and mobility with the hand"  NEXT MD VISIT: 05/10/23  OBJECTIVE:  Note: Objective measures were completed at Evaluation unless otherwise noted.  HAND DOMINANCE: Left  ADLs: Eating: ind Grooming: denture care, some difficulty using one hand to replace/remove dentures Upper body dressing: ind with pullover shirt with extra time, some pain of affected UE Lower body dressing: ind with extra time, difficulty with zippers d/t using L  hand Toileting: ind Bathing: sponge bathing, difficulty d/t one hand, assistance from spouse to wash back Tub shower transfers: currently sponge  bathing  FUNCTIONAL OUTCOME MEASURES: Quick Dash: 90.9% deficit     05/07/23 - 43.2% deficit   06/08/23 - 18.2% deficit    UPPER EXTREMITY ROM:     Active ROM Right eval Left eval  Shoulder flexion Unable to fully assess d/t presence of sling [facility]  Shoulder abduction Unable to fully assess d/t presence of sling   Shoulder adduction    Shoulder extension    Shoulder internal rotation    Shoulder external rotation    Elbow flexion WFL   Elbow extension WFL   Wrist flexion    Wrist extension    Wrist ulnar deviation    Wrist radial deviation    Wrist pronation    Wrist supination    (Blank rows = not tested)  04/02/23 - Unable to assess ROM of affected hand d/t recent surgery and current precautions and presence of bulky compressive dressing.  Active ROM Right eval Left eval  Thumb MCP (0-60)      WFL                           Thumb IP (0-80)    Thumb Radial abd/add (0-55)    Thumb Palmar abd/add (0-45)    Thumb Opposition to Small Finger    Index MCP (0-90)    Index PIP (0-100)    Index DIP (0-70)     Long MCP (0-90)     Long PIP (0-100)     Long DIP (0-70)     Ring MCP (0-90)     Ring PIP  (0-100)     Ring DIP (0-70)     Little MCP (0-90)     Little PIP (0-100)     Little DIP (0-70)     (Blank rows = not tested)  RUE 04/08/23 Wrist flex = 55*, ext = 45*  Thumb IP joint = 45* Shoulder, elbow, forearm and hand ROM WFL's  HAND FUNCTION: Grip strength: Unable to assess secondary to current precautions  COORDINATION: Eval: Unable to assess secondary to current precautions  05/07/23 - 9-hole peg test: LUE - 30 seconds, RUE - 41 seconds  SENSATION: Moderate tingling/numbness in B fingers  EDEMA: Mild in fingers, unable to assess full hand d/t presence of bulky compressive covering  COGNITION: Overall cognitive status: Within functional limits for tasks assessed  OBSERVATIONS: Pt was pleasant and appeared well-kept. Pt currently wearing sling of RUE with affected hand wrapped in Ace wrap as bulky compressive dressing.   TREATMENT DATE:      Orthotics f/u Pt reported hand-based splint sometimes gets "stuck" on thumb when removing splint. OT noted pt applied large amount of force to remove splint d/t splint getting caught slightly on IP joint of affected thumb. Therefore, OT educated pt on safe strategies to don/doff splint gently and to prevent splint from getting "stuck." Pt returned demo.  Per pt request, OT applied new Velcro to forearm-based splint.  OT reviewed splint wear schedules. Pt acknowledged understanding.  TherAct Per pt request, OT reviewed HEP thumb flex resisted with rubber band (10 reps, 2 sets) and HEP theraputty - to improve carryover of HEP, to improve affected UE strengthening and coordination. Pt returned demo and denied pain. Pt benefited from v/c to avoid hyperext of DIP middle finger of affected UE during theraputty tip pinch.  Placing/removing resistive clips - 3 sets of x5 yellow clips and x6 red clips - to build affected UE strengthening and coordination.  OT assessed pt's progress towards goals, see below for updates.   OT educated pt on  importance of using affected hand during functional tasks at home. Pt acknowledged understanding.   Self-Care Cutting yellow theraputty with butter knife graded up to streak knife - to improve and encourage functional use of affected UE, to improve attention to joint protection during functional tasks. OT educated pt on joint protection strategies, body mechanics and ergonomic principles when using knife and fork, importance of using affected R hand for functional tasks to improve outcomes. Pt returned demo with fading v/c and use of upright mirror for visual feedback to monitor BUE positioning during task.    PATIENT EDUCATION: Education details: see today's tx above Person educated: Patient  Education method: Programmer, multimedia, Demonstration, Verbal cues, and Handouts Education comprehension: verbalized understanding and returned demonstration  HOME EXERCISE PROGRAM: 04/08/23: splint wear and care, hygiene care, precautions, initial A/ROM HEP for RUE (NOT for CMC/MP joint of thumb) 04/23/23 - thumb ROM, Access Code: [email or web address], splint wear schedule per protocol: hand-based splint during day, forearm-based at night.  04/26/23 - thumb palmar ADD (access code: same as above) 04/29/23 - RUE PROM thumb IP flex in splint, place-and-hold, hold 10 seconds, 10 reps, 4x per day 05/11/23: thumb isometric ex (per protocol)  05/25/23: light putty HEP   GOALS: Goals reviewed with patient? Yes  SHORT TERM GOALS: Target date: 04/23/23  Pt will obtain protective, custom forearm based thumb spica splint/orthotic and ind recall splint wear/care schedule. Baseline: Pt currently has compressive bulky dressing on RUE with sling. 04/26/23 - Pt ind recalled splint wear/care schedule. Goal status: MET  2.  Pt will demo understanding of initial RUE HEP to improve pain levels and prerequisite motion for functional use of her R hand/thumb. Baseline: new to outpt OT 04/26/23 - Pt continues to benefit from v/c for completion of  HEP. 04/29/23 - Pt returned demo of initial RUE HPE with min to no v/c. Goal status: MET  3.  Pt will return demo of scar management techniques and edema management strategies. Baseline: new to outpt OT 04/29/23 - OT initiated scar massage HEP and pt returned demo.  05/03/23 - Pt returned demo of scar massage. Goal status: MET   LONG TERM GOALS: Target date: 06/25/23   Pt will be ind with updated RUE HEP Baseline: new to outpt OT Goal status: MET  2.  Patient will demonstrate at least 16% improvement with quick Dash score (reporting  27.3% disability or less) indicating improved functional use of affected extremity.  Baseline: 90.9% deficit  05/07/23 - 05/07/23 - 43.2% deficit 06/08/23 - 06/08/23 - 18.2% deficit Goal status: MET  3.  Pt will  maintain grip strength in R hand from unable to assess to at least  45 lbs for functional use at home and in ADLs/IADLs per precautions and protocol. Baseline: unable to assess secondary to current precautions 06/08/23 - RUE: 42.3, 51.1, 47.1, 42.7 (45.8 lbs average). LUE: 88.4 lbs Goal status: MET and revised on 06/08/23  4.  Pt will improve A/ROM in R thumb to PIP (or greater) of R small finger, to have functional motion for tasks like ADL's, reach and grasp  Baseline: unable to assess AROM secondary to current precautions and presence of compressive bulky dressing. 04/26/23 - Pt demo'd thumb opposition to R small finger PIP joint. Goal status: MET  5.  Pt will improve R hand functional use to at least be Mod I for basic ADL's (ie, brushing her hair, bathing,  brushing her teeth and folding laundry) to assist in ability to carry out self-care and higher-level home-care tasks with less difficulty. Baseline: unable to assess, compensating with L hand for all tasks d/t NWB RUE 05/07/23 - Pt beginning to use R hand for light tasks while wearing splint, such as folding laundry. Goal status: MET  6. Patient will demo improved FM coordination as evidenced by  completing nine-hole peg with use of RUE in 40 seconds or less.  Baseline: unable to assess secondary to current precautions and presence of bulky compressive dressing. 05/07/23 - 9-hole peg test: LUE - 30 seconds, RUE - 41 seconds 06/08/23 - RUE: 31 seconds  Goal Status: MET  ASSESSMENT:  CLINICAL IMPRESSION: Pt met 3 LTGs today and demo'd good progress with coordination and strength of the affected hand. Pt tolerated tasks well, continuing to benefit from verbal encouragement to attempt functional daily tasks with affected UE while attending to joint protection strategies. Pt weaning splint per protocol.  Pt would continue to benefit from skilled OT services in the outpatient setting to work on remaining impairments and goals. Pt approaching d/c in next 1-2 visits.   PERFORMANCE DEFICITS: in functional skills including ADLs, IADLs, coordination, dexterity, proprioception, sensation, edema, ROM, strength, pain, Fine motor control, Gross motor control, mobility, body mechanics, endurance, wound, skin integrity, and UE functional use, cognitive skills including  none , and psychosocial skills including environmental adaptation.   IMPAIRMENTS: are limiting patient from ADLs, IADLs, rest and sleep, leisure, and social participation.   COMORBIDITIES: may have co-morbidities  that affects occupational performance. Patient will benefit from skilled OT to address above impairments and improve overall function.  MODIFICATION OR ASSISTANCE TO COMPLETE EVALUATION: Min-Moderate modification of tasks or assist with assess necessary to complete an evaluation.  OT OCCUPATIONAL PROFILE AND HISTORY: Detailed assessment: Review of records and additional review of physical, cognitive, psychosocial history related to current functional performance.  CLINICAL DECISION MAKING: Moderate - several treatment options, min-mod task modification necessary  REHAB POTENTIAL: Good  EVALUATION COMPLEXITY:  Moderate      PLAN:  OT FREQUENCY: 1-2x/week   OT DURATION: for up to 6 additional weeks (beginning week of 05/17/23)  PLANNED INTERVENTIONS: 16109 OT Re-evaluation, 97535 self care/ADL training, 60454 therapeutic exercise, 97530 therapeutic activity, 97112 neuromuscular re-education, 97140 manual therapy, 97035 ultrasound, 97018 paraffin, 09811 fluidotherapy, 97010 moist heat, 97010 cryotherapy, 97760 Orthotics management and training, 91478 Splinting (initial encounter), [id number] Subsequent splinting/medication, passive range of motion, functional mobility training, energy conservation, patient/family education, and DME and/or AE instructions  RECOMMENDED OTHER SERVICES: N/A  CONSULTED AND AGREED WITH PLAN OF CARE: Patient  PLAN FOR NEXT SESSION:  continue light strengthening and functional use of hand Encourage functional use of hand Continue weaning from splint    For all possible CPT codes, reference the Planned Interventions line above.     Check all conditions that are expected to impact treatment: {Conditions expected to impact treatment:Musculoskeletal disorders and Active major medical illness  (Lupus)  If treatment provided at initial evaluation, no treatment charged due to lack of authorization.        Oakley Bellman, OT 06/08/2023, 7:01 PM

## 2023-06-09 ENCOUNTER — Other Ambulatory Visit: Payer: Self-pay | Admitting: Internal Medicine

## 2023-06-09 ENCOUNTER — Telehealth: Payer: Self-pay | Admitting: Internal Medicine

## 2023-06-09 ENCOUNTER — Ambulatory Visit: Attending: Family Medicine

## 2023-06-09 DIAGNOSIS — I739 Peripheral vascular disease, unspecified: Secondary | ICD-10-CM

## 2023-06-09 DIAGNOSIS — R2231 Localized swelling, mass and lump, right upper limb: Secondary | ICD-10-CM

## 2023-06-09 DIAGNOSIS — F331 Major depressive disorder, recurrent, moderate: Secondary | ICD-10-CM | POA: Diagnosis not present

## 2023-06-09 DIAGNOSIS — I152 Hypertension secondary to endocrine disorders: Secondary | ICD-10-CM | POA: Diagnosis not present

## 2023-06-09 DIAGNOSIS — E1159 Type 2 diabetes mellitus with other circulatory complications: Secondary | ICD-10-CM | POA: Diagnosis not present

## 2023-06-09 NOTE — Telephone Encounter (Signed)
 Copied from CRM [id number]. Topic: General - Other >> Jun 09, 2023  4:23 PM Zipporah Him wrote:  Reason for CRM: [facility] calling in regards to this patients imaging. They are needing more information before they are able to call the patient. Please call asap.

## 2023-06-10 ENCOUNTER — Encounter: Payer: Self-pay | Admitting: Internal Medicine

## 2023-06-10 LAB — COMPREHENSIVE METABOLIC PANEL WITH GFR
ALT: 10 IU/L (ref 0–32)
AST: 14 IU/L (ref 0–40)
Albumin: 4.4 g/dL (ref 3.9–4.9)
Alkaline Phosphatase: 136 IU/L — ABNORMAL HIGH (ref 44–121)
BUN/Creatinine Ratio: 11 — ABNORMAL LOW (ref 12–28)
BUN: 11 mg/dL (ref 8–27)
Bilirubin Total: 0.2 mg/dL (ref 0.0–1.2)
CO2: 21 mmol/L (ref 20–29)
Calcium: 9.9 mg/dL (ref 8.7–10.3)
Chloride: 99 mmol/L (ref 96–106)
Creatinine, Ser: 0.99 mg/dL (ref 0.57–1.00)
Globulin, Total: 2.8 g/dL (ref 1.5–4.5)
Glucose: 121 mg/dL — ABNORMAL HIGH (ref 70–99)
Potassium: 4.3 mmol/L (ref 3.5–5.2)
Sodium: 138 mmol/L (ref 134–144)
Total Protein: 7.2 g/dL (ref 6.0–8.5)
eGFR: 64 mL/min/{1.73_m2} (ref >59)

## 2023-06-10 LAB — LIPID PANEL
Chol/HDL Ratio: 3.5 ratio (ref 0.0–4.4)
Cholesterol, Total: 163 mg/dL (ref 100–199)
HDL: 46 mg/dL (ref >39)
LDL Chol Calc (NIH): 84 mg/dL (ref 0–99)
Triglycerides: 196 mg/dL — ABNORMAL HIGH (ref 0–149)
VLDL Cholesterol Cal: 33 mg/dL (ref 5–40)

## 2023-06-15 ENCOUNTER — Ambulatory Visit: Admitting: Occupational Therapy

## 2023-06-15 ENCOUNTER — Encounter: Payer: Self-pay | Admitting: Occupational Therapy

## 2023-06-15 DIAGNOSIS — M6281 Muscle weakness (generalized): Secondary | ICD-10-CM | POA: Diagnosis not present

## 2023-06-15 DIAGNOSIS — R29898 Other symptoms and signs involving the musculoskeletal system: Secondary | ICD-10-CM

## 2023-06-15 DIAGNOSIS — R278 Other lack of coordination: Secondary | ICD-10-CM

## 2023-06-15 NOTE — Therapy (Signed)
 OUTPATIENT OCCUPATIONAL THERAPY ORTHO TREATMENT  Patient Name: Stacy [name] MRN: [id number] DOB:01/25/1961, 63 y.o., female Today's Date: 06/15/2023  PCP: Lawrance Presume, MD  REFERRING PROVIDER: Merrill Abide, MD   END OF SESSION:  OT End of Session - 06/15/23 0805     Visit Number 14    Number of Visits 18   including eval   Date for OT Re-Evaluation 06/25/23    Authorization Type [employer]    OT Start Time 0802    OT Stop Time 0845    OT Time Calculation (min) 43 min    Activity Tolerance Patient tolerated treatment well    Behavior During Therapy WFL for tasks assessed/performed                     Past Medical History:  Diagnosis Date   Allergy     Shellfish, cleaning products   Anxiety    Arthritis    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   Asthma    COPD (chronic obstructive pulmonary disease) (HCC)    Depression    Diabetes mellitus    Type II   GERD (gastroesophageal reflux disease)    Pt on Protonix  daily   Glaucoma    Gout    Headache(784.0)    otc meds prn   Hyperlipidemia    Hypertension    Irritable bowel syndrome 11/19/2010   Neuropathy    Pneumonia YRS AGO   Restless legs    Shortness of breath    07/14/2018- uses  4 times a day   Sleep apnea    Past Surgical History:  Procedure Laterality Date   BACK SURGERY     feb 21, 23   CARPAL TUNNEL RELEASE Right 03/26/2023   Procedure: RIGHT CARPAL TUNNEL RELEASE;  Surgeon: Merrill Abide, MD;  Location: [facility];  Service: Orthopedics;  Laterality: Right;   CARPOMETACARPEL SUSPENSION PLASTY Right 03/26/2023   Procedure: RIGHT THUMB CARPOMETACARPEL (CMC)  ARTHROPLASTY WITH INTERNAL BRACE;  Surgeon: Merrill Abide, MD;  Location: [facility];  Service: Orthopedics;  Laterality: Right;   CHOLECYSTECTOMY     COLONOSCOPY     ENDOMETRIAL ABLATION  10/2010   HERNIA REPAIR     umbicial hernia   JOINT REPLACEMENT Left  03/14/2019   Dr. Lucienne Ryder hip   KNEE ARTHROSCOPY Left    06/07/2017 Dr. Rozelle Corning [facility]   KNEE ARTHROSCOPY Left 03/21/2020   Procedure: LEFT KNEE ARTHROSCOPY WITH PARTIAL MEDIAL MENISCECTOMY;  Surgeon: Arnie Lao, MD;  Location: [facility];  Service: Orthopedics;  Laterality: Left;   KNEE CLOSED REDUCTION Right 12/06/2015   Procedure: CLOSED MANIPULATION RIGHT KNEE;  Surgeon: Alphonso Jean, MD;  Location: [facility];  Service: Orthopedics;  Laterality: Right;   KNEE CLOSED REDUCTION Right 01/17/2016   Procedure: CLOSED MANIPULATION RIGHT KNEE;  Surgeon: Alphonso Jean, MD;  Location: [facility];  Service: Orthopedics;  Laterality: Right;   KNEE JOINT MANIPULATION Right 12/06/2015   LACRIMAL TUBE INSERTION Bilateral 03/01/2019   Procedure: LACRIMAL TUBE INSERTION;  Surgeon: Lorena Rolling, MD;  Location: [facility];  Service: Ophthalmology;  Laterality: Bilateral;   LACRIMAL TUBE REMOVAL Bilateral 05/03/2019   Procedure: BILATERAL NASOLACRIMAL DUCT PROBING, IIRIGATION AND TUBE REMOVAL BOTH EYES;  Surgeon: Lorena Rolling, MD;  Location:  SURGERY CENTER;  Service: Ophthalmology;  Laterality: Bilateral;   LUMBAR LAMINECTOMY/DECOMPRESSION MICRODISCECTOMY N/A 06/03/2015   Procedure: Bilateral lateral recess decompression L2-3,  L3-4, L4-5;  Surgeon: Alphonso Jean, MD;  Location: [facility];  Service: Orthopedics;  Laterality: N/A;   LUMBAR LAMINECTOMY/DECOMPRESSION MICRODISCECTOMY N/A 10/02/2016   Procedure: Right L5-S1 Lateral Recess Decompression  microdiscectomy;  Surgeon: Alphonso Jean, MD;  Location: [facility] OR;  Service: Orthopedics;  Laterality: N/A;   LUMBAR LAMINECTOMY/DECOMPRESSION MICRODISCECTOMY N/A 07/15/2018   Procedure: LEFT L3-4 MICRODISCECTOMY;  Surgeon: Alphonso Jean, MD;  Location: [facility] OR;  Service: Orthopedics;  Laterality: N/A;   svd      x 2   TEAR DUCT PROBING Bilateral 03/01/2019   Procedure: TEAR DUCT PROBING WITH IRRIGATION;   Surgeon: Lorena Rolling, MD;  [facility]  Service: Ophthalmology;  Laterality: Bilateral;   TOTAL HIP ARTHROPLASTY Left 03/14/2019   Procedure: LEFT TOTAL HIP ARTHROPLASTY ANTERIOR APPROACH;  Surgeon: Arnie Lao, MD;  Location[facility];  Service: Orthopedics;  Laterality: Left;   TOTAL KNEE ARTHROPLASTY Right 09/06/2015   Procedure: RIGHT TOTAL KNEE ARTHROPLASTY;  Surgeon: Alphonso Jean, MD;  Location: [facility];  Service: Orthopedics;  Laterality: Right;   TOTAL KNEE REVISION Right 05/12/2022   Procedure: RIGHT TOTAL KNEE REVISION ARTHROPLASTY;  Surgeon: Arnie Lao, MD;  Location: [facility];  Service: Orthopedics;  Laterality: Right;   TUBAL LIGATION     UPPER GASTROINTESTINAL ENDOSCOPY  04/28/2011   Patient Active Problem List   Diagnosis Date Noted   Arthritis of carpometacarpal [facility]) joint of right thumb 03/26/2023   Carpal tunnel syndrome, right upper limb 03/26/2023   Status post revision of total replacement of right knee 05/12/2022   History of colonic polyps 03/20/2022   Failed total knee, right, subsequent encounter 03/02/2022   Loose right total knee arthroplasty (HCC) 03/02/2022   Mixed stress and urge urinary incontinence 12/04/2021   Functional fecal incontinence 12/04/2021   IBS (irritable bowel syndrome) 11/19/2021   Spondylolisthesis, lumbar region    Other spondylosis with radiculopathy, lumbar region    Other secondary scoliosis, lumbar region    Fusion of spine of lumbar region 03/25/2021   Colon polyps 06/06/2020   Lupus 06/06/2020   Allergic rhinitis due to animal (cat) (dog) hair and dander 04/08/2020   Allergic rhinitis due to pollen 04/08/2020   Food allergy  04/08/2020   Acute medial meniscus tear, left, subsequent encounter 03/21/2020   Chronic pain of left knee 02/15/2020   Paresthesia of skin 11/16/2019   History of total knee replacement, right 11/16/2019   Tobacco abuse 11/16/2019   Centrilobular emphysema  (HCC) 08/10/2019   Incidental lung nodule, > 3mm and < 8mm 08/10/2019   OSA on CPAP 08/10/2019   Dyspnea on exertion 08/02/2019   Hyperlipidemia 08/02/2019   Lumbar radiculopathy 04/24/2019   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   Post laminectomy syndrome 02/23/2019   Abnormality of gait 02/23/2019   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   Unilateral primary osteoarthritis, left hip 12/28/2018   Lesion of skin of left ear 12/26/2018   Primary osteoarthritis of left hip 12/02/2018   Iron  deficiency anemia 10/16/2018   Chronic pain syndrome 09/08/2018   Chronic pain of right knee 08/11/2018   Status post lumbar laminectomy 07/15/2018   Peripheral arterial disease (HCC) 04/05/2018   Moderate persistent asthma without complication 06/29/2017   Environmental and seasonal allergies 06/29/2017   Controlled type 2 diabetes mellitus with diabetic polyneuropathy, without long-term current use of insulin  (HCC) 06/29/2017   Perennial allergic rhinitis 04/08/2017   Sensorineural hearing loss (SNHL), bilateral 04/08/2017   Chronic  pansinusitis 03/25/2017   Eustachian tube dysfunction, bilateral 03/25/2017   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   Herniation of lumbar intervertebral disc with radiculopathy 10/02/2016    Class: Chronic   Alopecia areata 08/19/2016   Chondromalacia of both patellae 06/03/2015    Class: Chronic   Spinal stenosis, lumbar region, with neurogenic claudication 06/03/2015   Tobacco use disorder 04/25/2015   DJD (degenerative joint disease) of knee 01/04/2015   Hemorrhoid 11/14/2014   Gout of big toe 07/19/2014   Essential hypertension 08/14/2013   Gastroesophageal reflux disease without esophagitis 08/14/2013   COPD (chronic obstructive pulmonary disease) (HCC) 04/17/2011    ONSET DATE: 03/30/2023 (referral date), 03/26/23 (date of surgery, s/p R thumb CMC arthroplasty, R open carpal tunnel release)  REFERRING DIAG: M18.11 (ICD-10-CM) - Arthritis of carpometacarpal  (CMC) joint of right thumb  THERAPY DIAG:  Other lack of coordination  Other symptoms and signs involving the musculoskeletal system  Muscle weakness (generalized)  Rationale for Evaluation and Treatment: Rehabilitation  SUBJECTIVE:   SUBJECTIVE STATEMENT: Pt reported upcoming appointment with Dr. Merlinda Starling on 519/25. No new falls since the last time  Pt accompanied by: self  PERTINENT HISTORY: 03/26/23 R thumb CMC arthroplasty and R open carpal tunnel release, s/p R TKA 2024, lupus, L hip total arthroplasty, tobacco abuse, hyperlipidemia, OSA on CPAP, essential HTN, COPD, GERD, HPV   03/30/23 Referral Notes:  Splint fabrication S/p [facility] arthroplasty *Thursday  March 6th after 9 am  PRECAUTIONS: Per 5th Edition [place]  Hand protocol, Fall, Other:   Per 03/26/23 OP Note: The patient will be non weight bearing on the right upper extremity in a spica splint. I will see the patient back in the office in 2 weeks for postoperative followup.   RED FLAGS: Hx of lupus   WEIGHT BEARING RESTRICTIONS: Yes  , per [facility] arthroplasty protocol   PAIN:  Are you having pain? 0 / 10 pain level.  FALLS: Has patient fallen in last 6 months? Yes. Number of falls 1 L knee buckled on Friday last week at surgery center, pt reported no injuries, assistance from nurses to get up  04/29/23 update - Pt reported L knee buckled and pt fell down to the ground. Pt denied injuries though required assistance from family to get up.   06/08/23 - Pt fell after church: L knee buckled.   LIVING ENVIRONMENT: Lives with: lives with their spouse Lives in: House/apartment Stairs: Yes: Internal: 12 steps; none and External: 2 steps; on right going up and on left going up   PLOF: Independent with basic ADLs, transportation service for community mobility  PATIENT GOALS: "improvement and mobility with the hand"  NEXT MD VISIT: 05/10/23  OBJECTIVE:  Note: Objective measures were completed at Evaluation unless otherwise  noted.  HAND DOMINANCE: Left  ADLs: Eating: ind Grooming: denture care, some difficulty using one hand to replace/remove dentures Upper body dressing: ind with pullover shirt with extra time, some pain of affected UE Lower body dressing: ind with extra time, difficulty with zippers d/t using L  hand Toileting: ind Bathing: sponge bathing, difficulty d/t one hand, assistance from spouse to wash back Tub shower transfers: currently sponge bathing  FUNCTIONAL OUTCOME MEASURES: Quick Dash: 90.9% deficit     05/07/23 - 43.2% deficit   06/08/23 - 18.2% deficit    UPPER EXTREMITY ROM:     Active ROM Right eval Left eval  Shoulder flexion Unable to fully assess d/t presence of sling WFL  Shoulder abduction Unable to fully assess d/t presence  of sling   Shoulder adduction    Shoulder extension    Shoulder internal rotation    Shoulder external rotation    Elbow flexion WFL   Elbow extension WFL   Wrist flexion    Wrist extension    Wrist ulnar deviation    Wrist radial deviation    Wrist pronation    Wrist supination    (Blank rows = not tested)  04/02/23 - Unable to assess ROM of affected hand d/t recent surgery and current precautions and presence of bulky compressive dressing.  Active ROM Right eval Left eval  Thumb MCP (0-60)      WFL                           Thumb IP (0-80)    Thumb Radial abd/add (0-55)    Thumb Palmar abd/add (0-45)    Thumb Opposition to Small Finger    Index MCP (0-90)    Index PIP (0-100)    Index DIP (0-70)     Long MCP (0-90)     Long PIP (0-100)     Long DIP (0-70)     Ring MCP (0-90)     Ring PIP (0-100)     Ring DIP (0-70)     Little MCP (0-90)     Little PIP (0-100)     Little DIP (0-70)     (Blank rows = not tested)  RUE 04/08/23 Wrist flex = 55*, ext = 45*  Thumb IP joint = 45* Shoulder, elbow, forearm and hand ROM WFL's  HAND FUNCTION: Grip strength: Unable to assess secondary to current  precautions  COORDINATION: Eval: Unable to assess secondary to current precautions  05/07/23 - 9-hole peg test: LUE - 30 seconds, RUE - 41 seconds  SENSATION: Moderate tingling/numbness in B fingers  EDEMA: Mild in fingers, unable to assess full hand d/t presence of bulky compressive covering  COGNITION: Overall cognitive status: Within functional limits for tasks assessed  OBSERVATIONS: Pt was pleasant and appeared well-kept. Pt currently wearing sling of RUE with affected hand wrapped in Ace wrap as bulky compressive dressing.   TREATMENT DATE:      Grip strength Rt = 57 lbs  Pt carrying 5 lb weight in Rt hand for isometric strengthening followed by simulated "pouring" motion in prep for pouring milk, etc. Into glass  Flex bar (tan) for functional strengthening: bending bar x 10 w/ controlled release,  then twisting/wringing x 10 each way to simulate wringing out washcloth  Resistive gripper set at level 2 resistance to pick up blocks Rt hand for sustained grip strength and functional use  Pt removing and replacing resistive clothespins from antenna for lateral pinch strength and functional use (yellow to blue resistance)     PATIENT EDUCATION: Education details: see today's tx above Person educated: Patient  Education method: Programmer, multimedia, Demonstration, Verbal cues, and Handouts Education comprehension: verbalized understanding and returned demonstration  HOME EXERCISE PROGRAM: 04/08/23: splint wear and care, hygiene care, precautions, initial A/ROM HEP for RUE (NOT for CMC/MP joint of thumb) 04/23/23 - thumb ROM, Access Code: [email or web address], splint wear schedule per protocol: hand-based splint during day, forearm-based at night.  04/26/23 - thumb palmar ADD (access code: same as above) 04/29/23 - RUE PROM thumb IP flex in splint, place-and-hold, hold 10 seconds, 10 reps, 4x per day 05/11/23: thumb isometric ex (per protocol)  05/25/23: light putty HEP   GOALS: Goals reviewed with  patient?  Yes  SHORT TERM GOALS: Target date: 04/23/23  Pt will obtain protective, custom forearm based thumb spica splint/orthotic and ind recall splint wear/care schedule. Baseline: Pt currently has compressive bulky dressing on RUE with sling. 04/26/23 - Pt ind recalled splint wear/care schedule. Goal status: MET  2.  Pt will demo understanding of initial RUE HEP to improve pain levels and prerequisite motion for functional use of her R hand/thumb. Baseline: new to outpt OT 04/26/23 - Pt continues to benefit from v/c for completion of HEP. 04/29/23 - Pt returned demo of initial RUE HPE with min to no v/c. Goal status: MET  3.  Pt will return demo of scar management techniques and edema management strategies. Baseline: new to outpt OT 04/29/23 - OT initiated scar massage HEP and pt returned demo.  05/03/23 - Pt returned demo of scar massage. Goal status: MET   LONG TERM GOALS: Target date: 06/25/23   Pt will be ind with updated RUE HEP Baseline: new to outpt OT Goal status: MET  2.  Patient will demonstrate at least 16% improvement with quick Dash score (reporting  27.3% disability or less) indicating improved functional use of affected extremity.  Baseline: 90.9% deficit  05/07/23 - 05/07/23 - 43.2% deficit 06/08/23 - 06/08/23 - 18.2% deficit Goal status: MET  3.  Pt will  maintain grip strength in R hand from unable to assess to at least  45 lbs for functional use at home and in ADLs/IADLs per precautions and protocol. Baseline: unable to assess secondary to current precautions 06/08/23 - RUE: 42.3, 51.1, 47.1, 42.7 (45.8 lbs average). LUE: 88.4 lbs 06/15/23: RUE: 57 LBS Goal status: MET   4.  Pt will improve A/ROM in R thumb to PIP (or greater) of R small finger, to have functional motion for tasks like ADL's, reach and grasp  Baseline: unable to assess AROM secondary to current precautions and presence of compressive bulky dressing. 04/26/23 - Pt demo'd thumb opposition to R small finger  PIP joint. Goal status: MET  5.  Pt will improve R hand functional use to at least be Mod I for basic ADL's (ie, brushing her hair, bathing, brushing her teeth and folding laundry) to assist in ability to carry out self-care and higher-level home-care tasks with less difficulty. Baseline: unable to assess, compensating with L hand for all tasks d/t NWB RUE 05/07/23 - Pt beginning to use R hand for light tasks while wearing splint, such as folding laundry. Goal status: MET  6. Patient will demo improved FM coordination as evidenced by completing nine-hole peg with use of RUE in 40 seconds or less.  Baseline: unable to assess secondary to current precautions and presence of bulky compressive dressing. 05/07/23 - 9-hole peg test: LUE - 30 seconds, RUE - 41 seconds 06/08/23 - RUE: 31 seconds  Goal Status: MET  ASSESSMENT:  CLINICAL IMPRESSION: Pt has met all STG's and LTG's and reports no pain. Pt instructed to d/c pm splint by Friday and daytime splint can be d/c unless she feels certain activities still bother her w/o it. Pt almost 12 weeks post-op now.   PERFORMANCE DEFICITS: in functional skills including ADLs, IADLs, coordination, dexterity, proprioception, sensation, edema, ROM, strength, pain, Fine motor control, Gross motor control, mobility, body mechanics, endurance, wound, skin integrity, and UE functional use, cognitive skills including none, and psychosocial skills including environmental adaptation.   IMPAIRMENTS: are limiting patient from ADLs, IADLs, rest and sleep, leisure, and social participation.   COMORBIDITIES: may have co-morbidities  that affects occupational performance. Patient will benefit from skilled OT to address above impairments and improve overall function.  MODIFICATION OR ASSISTANCE TO COMPLETE EVALUATION: Min-Moderate modification of tasks or assist with assess necessary to complete an evaluation.  OT OCCUPATIONAL PROFILE AND HISTORY: Detailed assessment: Review  of records and additional review of physical, cognitive, psychosocial history related to current functional performance.  CLINICAL DECISION MAKING: Moderate - several treatment options, min-mod task modification necessary  REHAB POTENTIAL: Good  EVALUATION COMPLEXITY: Moderate      PLAN:  OT FREQUENCY: 1-2x/week   OT DURATION: for up to 6 additional weeks (beginning week of 05/17/23)  PLANNED INTERVENTIONS: 16109 OT Re-evaluation, 97535 self care/ADL training, 60454 therapeutic exercise, 97530 therapeutic activity, 97112 neuromuscular re-education, 97140 manual therapy, 97035 ultrasound, 97018 paraffin, 09811 fluidotherapy, 97010 moist heat, 97010 cryotherapy, 97760 Orthotics management and training, 91478 Splinting (initial encounter), [id number] Subsequent splinting/medication, passive range of motion, functional mobility training, energy conservation, patient/family education, and DME and/or AE instructions  RECOMMENDED OTHER SERVICES: N/A  CONSULTED AND AGREED WITH PLAN OF CARE: Patient  PLAN:  Anticipate d/c, however will wait to formally d/c until pt sees MD next Monday 06/21/23 to assess for any other concerns. Pt however is doing well with all aspects of function, no pain, and gaining strength as anticipated. Will d/c if no further concerns at MD appointment.    For all possible CPT codes, reference the Planned Interventions line above.     Check all conditions that are expected to impact treatment: {Conditions expected to impact treatment:Musculoskeletal disorders and Active major medical illness  (Lupus)  If treatment provided at initial evaluation, no treatment charged due to lack of authorization.        Velinda Getting, OT 06/15/2023, 8:06 AM

## 2023-06-15 NOTE — Telephone Encounter (Signed)
 Appointment has been scheduled.

## 2023-06-16 DIAGNOSIS — F331 Major depressive disorder, recurrent, moderate: Secondary | ICD-10-CM | POA: Diagnosis not present

## 2023-06-21 ENCOUNTER — Ambulatory Visit: Admitting: Orthopedic Surgery

## 2023-06-21 DIAGNOSIS — G5601 Carpal tunnel syndrome, right upper limb: Secondary | ICD-10-CM

## 2023-06-21 DIAGNOSIS — M1811 Unilateral primary osteoarthritis of first carpometacarpal joint, right hand: Secondary | ICD-10-CM

## 2023-06-21 NOTE — Progress Notes (Signed)
   Everly Rubalcava Orourke - 63 y.o. female MRN [id number]  Date of birth: 12/15/1960  Office Visit Note: Visit Date: 06/21/2023 PCP: Lawrance Presume, MD Referred by: Lawrance Presume, MD  Subjective:  HPI: Natesha Hassey Owczarzak is a 63 y.o. female who presents today for follow up 12 weeks status post right thumb carpometacarpel arthroplasty, right open carpal tunnel release.  She has completed occupational therapy with transition to home exercises.  States that her numbness and tingling has resolved in the right hand.  She is very pleased with her outcome.  Pertinent ROS were reviewed with the patient and found to be negative unless otherwise specified above in HPI.   Assessment & Plan: Visit Diagnoses: No diagnosis found.  Plan: She has done excellent at this juncture.  I thumb stability demonstrates appropriate clinical evaluation, her thumb range of motion is excellent as well as her thumb pinch strength.  I am pleased to see that her numbness and tingling is also resolved.  At this juncture, she is cleared for all activities without restriction, continue home exercises as instructed.  She is welcome to follow-up with me on an as-needed basis moving forward.  Follow-up: No follow-ups on file.   Meds & Orders: No orders of the defined types were placed in this encounter.  No orders of the defined types were placed in this encounter.    Procedures: No procedures performed       Objective:   Vital Signs: LMP 09/01/2010   Ortho Exam Right wrist: - Well-healed incision at the glabrous/nonglabrous juncture over the [facility] region of the thumb - Thumb opposition to the small finger DPC - Thumb circumduction without significant pain or crepitus - Hand is warm well-perfused, sensation intact in all distributions including DRSN - Thumb pinch strength right 12, left 15  Imaging: No results found.   Sharonne Ricketts Alvia Jointer, M.D. [facility], Hand Surgery

## 2023-06-23 DIAGNOSIS — F331 Major depressive disorder, recurrent, moderate: Secondary | ICD-10-CM | POA: Diagnosis not present

## 2023-06-30 DIAGNOSIS — F331 Major depressive disorder, recurrent, moderate: Secondary | ICD-10-CM | POA: Diagnosis not present

## 2023-07-06 ENCOUNTER — Other Ambulatory Visit: Payer: Self-pay | Admitting: Internal Medicine

## 2023-07-07 DIAGNOSIS — F331 Major depressive disorder, recurrent, moderate: Secondary | ICD-10-CM | POA: Diagnosis not present

## 2023-07-09 DIAGNOSIS — R32 Unspecified urinary incontinence: Secondary | ICD-10-CM | POA: Diagnosis not present

## 2023-07-09 DIAGNOSIS — I10 Essential (primary) hypertension: Secondary | ICD-10-CM | POA: Diagnosis not present

## 2023-07-14 DIAGNOSIS — F331 Major depressive disorder, recurrent, moderate: Secondary | ICD-10-CM | POA: Diagnosis not present

## 2023-07-15 ENCOUNTER — Encounter: Payer: Self-pay | Admitting: Internal Medicine

## 2023-07-17 ENCOUNTER — Other Ambulatory Visit

## 2023-07-21 DIAGNOSIS — F331 Major depressive disorder, recurrent, moderate: Secondary | ICD-10-CM | POA: Diagnosis not present

## 2023-07-28 DIAGNOSIS — F331 Major depressive disorder, recurrent, moderate: Secondary | ICD-10-CM | POA: Diagnosis not present

## 2023-08-03 DIAGNOSIS — R32 Unspecified urinary incontinence: Secondary | ICD-10-CM | POA: Diagnosis not present

## 2023-08-03 DIAGNOSIS — R159 Full incontinence of feces: Secondary | ICD-10-CM | POA: Diagnosis not present

## 2023-08-03 DIAGNOSIS — I10 Essential (primary) hypertension: Secondary | ICD-10-CM | POA: Diagnosis not present

## 2023-08-04 ENCOUNTER — Other Ambulatory Visit: Payer: Self-pay | Admitting: Internal Medicine

## 2023-08-04 DIAGNOSIS — F331 Major depressive disorder, recurrent, moderate: Secondary | ICD-10-CM | POA: Diagnosis not present

## 2023-08-04 DIAGNOSIS — F419 Anxiety disorder, unspecified: Secondary | ICD-10-CM

## 2023-08-04 DIAGNOSIS — I739 Peripheral vascular disease, unspecified: Secondary | ICD-10-CM

## 2023-08-11 DIAGNOSIS — F331 Major depressive disorder, recurrent, moderate: Secondary | ICD-10-CM | POA: Diagnosis not present

## 2023-08-17 ENCOUNTER — Ambulatory Visit (INDEPENDENT_AMBULATORY_CARE_PROVIDER_SITE_OTHER): Admitting: Podiatry

## 2023-08-17 ENCOUNTER — Encounter: Payer: Self-pay | Admitting: Podiatry

## 2023-08-17 DIAGNOSIS — E1142 Type 2 diabetes mellitus with diabetic polyneuropathy: Secondary | ICD-10-CM | POA: Diagnosis not present

## 2023-08-17 DIAGNOSIS — B351 Tinea unguium: Secondary | ICD-10-CM | POA: Diagnosis not present

## 2023-08-17 DIAGNOSIS — M79676 Pain in unspecified toe(s): Secondary | ICD-10-CM

## 2023-08-17 NOTE — Progress Notes (Signed)
 Subjective:  Patient ID: Stacy [name], female    DOB: 11-08-1960,  MRN: [id number]  Stacy [name] presents to clinic today for at risk foot care with history of diabetic neuropathy and painful thick toenails that are difficult to trim. Pain interferes with ambulation. Aggravating factors include wearing enclosed shoe gear. Pain is relieved with periodic professional debridement.  Chief Complaint  Patient presents with   Diabetes    DFC A1C 7.4 Toenail trim. LOV  with PCP 05/2023.   New problem(s): None.   PCP is Vicci Barnie NOVAK, MD.  Allergies  Allergen Reactions   Other Shortness Of Breath    UNSPECIFIED AGENTS Allergic to perfumes and cleaning products   Shellfish Allergy  Anaphylaxis    Per allergy  test.   Ace Inhibitors Cough and Other (See Comments)        Celebrex  [Celecoxib ]      upset stomach  Pt is taking med*   [employer] Other (See Comments)   Aspirin  Nausea Only and Other (See Comments)    stomach upset  Other reaction(s): Nausea Only    Review of Systems: Negative except as noted in the HPI.  Objective: No changes noted in today's physical examination. There were no vitals filed for this visit. Stacy [name] is a pleasant 63 y.o. female WD, WN in NAD. AAO x 3.  Vascular Examination: CFT less than 3 seconds. DP pulses palpable b/l. PT pulses nonpalpable b/l. Digital hair absent. Skin temperature gradient warm to warn b/l. No ischemia or gangrene. No cyanosis or clubbing noted b/l. No edema noted b/l LE.   Neurological Examination: Sensation grossly intact b/l with 10 gram monofilament. Vibratory sensation intact b/l. Pt has subjective symptoms of neuropathy.  Dermatological Examination: Pedal skin thin, shiny and atrophic b/l. No open wounds. No interdigital macerations.   Toenails 1-5 b/l thick, discolored, elongated with subungual debris and pain on dorsal palpation.   No corns, calluses nor porokeratotic  lesions noted.  Musculoskeletal Examination: Normal muscle strength 5/5 to all lower extremity muscle groups bilaterally. HAV with bunion bilaterally and hammertoes 2-5 b/l.[name] No pain, crepitus or joint limitation noted with ROM b/l LE.  Patient ambulates independently without assistive aids.  Radiographs: None  Last A1c:      Latest Ref Rng & Units 05/10/2023    9:13 AM 01/08/2023    4:20 PM 09/07/2022   12:28 PM  Hemoglobin A1C  Hemoglobin-A1c 0.0 - 7.0 % 7.4  6.8  6.2     Assessment/Plan: 1. Pain due to onychomycosis of toenail   2. Diabetic peripheral neuropathy associated with type 2 diabetes mellitus (HCC)    Patient was evaluated and treated. All patient's and/or POA's questions/concerns addressed on today's visit. Mycotic toenails 1-5 debrided in length and girth without incident.  Continue daily foot inspections and monitor blood glucose per PCP/Endocrinologist's recommendations.Continue soft, supportive shoe gear daily. Report any pedal injuries to medical professional. Call office if there are any quesitons/concerns. -Patient/POA to call should there be question/concern in the interim.   Return in about 3 months (around 11/17/2023).  Delon LITTIE Merlin, DPM      [facility] LOCATION: [address], [place] [address]  Office [phone]   [facility] LOCATION: [address] [place], [place] [address] Office [phone]

## 2023-08-18 DIAGNOSIS — F331 Major depressive disorder, recurrent, moderate: Secondary | ICD-10-CM | POA: Diagnosis not present

## 2023-08-25 DIAGNOSIS — F331 Major depressive disorder, recurrent, moderate: Secondary | ICD-10-CM | POA: Diagnosis not present

## 2023-09-01 ENCOUNTER — Other Ambulatory Visit: Payer: Self-pay | Admitting: Internal Medicine

## 2023-09-01 ENCOUNTER — Other Ambulatory Visit: Payer: Self-pay | Admitting: Family Medicine

## 2023-09-01 DIAGNOSIS — E1142 Type 2 diabetes mellitus with diabetic polyneuropathy: Secondary | ICD-10-CM

## 2023-09-01 DIAGNOSIS — J3089 Other allergic rhinitis: Secondary | ICD-10-CM

## 2023-09-01 DIAGNOSIS — F331 Major depressive disorder, recurrent, moderate: Secondary | ICD-10-CM | POA: Diagnosis not present

## 2023-09-01 DIAGNOSIS — E1159 Type 2 diabetes mellitus with other circulatory complications: Secondary | ICD-10-CM

## 2023-09-03 DIAGNOSIS — R159 Full incontinence of feces: Secondary | ICD-10-CM | POA: Diagnosis not present

## 2023-09-03 DIAGNOSIS — R32 Unspecified urinary incontinence: Secondary | ICD-10-CM | POA: Diagnosis not present

## 2023-09-03 DIAGNOSIS — I10 Essential (primary) hypertension: Secondary | ICD-10-CM | POA: Diagnosis not present

## 2023-09-07 ENCOUNTER — Ambulatory Visit: Admitting: Orthopedic Surgery

## 2023-09-07 DIAGNOSIS — M1811 Unilateral primary osteoarthritis of first carpometacarpal joint, right hand: Secondary | ICD-10-CM | POA: Diagnosis not present

## 2023-09-07 DIAGNOSIS — G5602 Carpal tunnel syndrome, left upper limb: Secondary | ICD-10-CM

## 2023-09-07 DIAGNOSIS — G5601 Carpal tunnel syndrome, right upper limb: Secondary | ICD-10-CM | POA: Diagnosis not present

## 2023-09-07 MED ORDER — LIDOCAINE HCL 1 % IJ SOLN
1.0000 mL | INTRAMUSCULAR | Status: AC | PRN
  Administered 2023-09-07: 1 mL

## 2023-09-07 MED ORDER — BETAMETHASONE SOD PHOS & ACET 6 (3-3) MG/ML IJ SUSP
6.0000 mg | INTRAMUSCULAR | Status: AC | PRN
  Administered 2023-09-07: 6 mg via INTRA_ARTICULAR

## 2023-09-07 NOTE — Progress Notes (Signed)
   Stacy [name] - 63 y.o. female MRN [id number]  Date of birth: [birth year]  Office Visit Note: Visit Date: 09/07/2023 PCP: Vicci Barnie NOVAK, MD Referred by: Vicci Barnie NOVAK, MD  Subjective:  HPI: Stacy [name] is a 63 y.o. female who presents today for follow up 5 months status post right thumb carpometacarpel arthroplasty, right open carpal tunnel release.  She is doing very well on the right side.  She is describing some progressive numbness and tingling on the left hand over the past few months.  She does have ongoing nocturnal symptoms as well, similar to what she was experiencing on the right side previously.  Has not undergone any treatment for the left side currently.  She has an EMG from January of last year which did not show significant slowing of the median nerve at the wrist level.  Pertinent ROS were reviewed with the patient and found to be negative unless otherwise specified above in HPI.   Assessment & Plan: Visit Diagnoses:  1. Carpal tunnel syndrome, left upper limb     Plan: Based on her clinical examination today, she is exhibiting signs and symptoms consistent with left-sided carpal tunnel syndrome.  Given that she has not trialed conservative measures at this time, we will begin with bracing and injection to the left carpal tunnel for symptom relief and therapeutic benefit.  This will also help with diagnostics.  I have also recommended that she obtain a more up-to-date EMG of the left side to look for potential nerve compression versus potential cervical radiculopathy based on her history.  Injection was provided today to the left carpal tunnel without issue.  Risk and benefits of the injection were discussed in detail.  She will follow-up in approximate 6 weeks to review results of the injection and electrodiagnostic testing.  As far as the right side status post right thumb CMC arthroplasty and carpal tunnel release, she is doing very well.  Thumb  stability is excellent on examination today, thumb opposition to small finger [facility] and she has resumed all activities without restriction.  Numbness and tingling has resolved in the right hand as well, she is very pleased with her outcome.  Follow-up: No follow-ups on file.   Meds & Orders: No orders of the defined types were placed in this encounter.  No orders of the defined types were placed in this encounter.    Procedures: Hand/UE Inj: L carpal tunnel for carpal tunnel syndrome on 09/07/2023 9:26 AM Indications: diagnostic and therapeutic Details: 25 G needle, volar approach Medications: 1 mL lidocaine  1 %; 6 mg betamethasone  acetate-betamethasone  sodium phosphate  6 (3-3) MG/ML Outcome: tolerated well, no immediate complications Procedure, treatment alternatives, risks and benefits explained, specific risks discussed.           Objective:   Vital Signs: LMP 09/01/2010   Ortho Exam Right wrist: - Well-healed incision at the glabrous/nonglabrous juncture over the [facility] region of the thumb - Thumb opposition to the small finger DPC - Thumb circumduction without significant pain or crepitus - Hand is warm well-perfused, sensation intact in all distributions including DRSN - Thumb pinch strength right 10, left 12  Left wrist: - Positive Tinel's carpal tunnel, positive Phalen's, positive Durkan's compression - Sensation diminished in the median nerve distribution to light touch - APB 5/5 without significant thenar atrophy, thumb opposition is well-preserved small finger DPC   Imaging: No results found.   Paddy Neis Afton Alderton, M.D. [facility], Hand Surgery

## 2023-09-08 DIAGNOSIS — F331 Major depressive disorder, recurrent, moderate: Secondary | ICD-10-CM | POA: Diagnosis not present

## 2023-09-13 ENCOUNTER — Encounter: Payer: Self-pay | Admitting: Internal Medicine

## 2023-09-13 ENCOUNTER — Ambulatory Visit: Attending: Internal Medicine | Admitting: Internal Medicine

## 2023-09-13 VITALS — BP 133/80 | HR 69 | Temp 98.6°F | Ht 66.0 in | Wt 154.0 lb

## 2023-09-13 DIAGNOSIS — R2231 Localized swelling, mass and lump, right upper limb: Secondary | ICD-10-CM | POA: Diagnosis not present

## 2023-09-13 DIAGNOSIS — E119 Type 2 diabetes mellitus without complications: Secondary | ICD-10-CM

## 2023-09-13 DIAGNOSIS — R252 Cramp and spasm: Secondary | ICD-10-CM | POA: Diagnosis not present

## 2023-09-13 DIAGNOSIS — E1169 Type 2 diabetes mellitus with other specified complication: Secondary | ICD-10-CM

## 2023-09-13 DIAGNOSIS — Z7984 Long term (current) use of oral hypoglycemic drugs: Secondary | ICD-10-CM

## 2023-09-13 DIAGNOSIS — E1159 Type 2 diabetes mellitus with other circulatory complications: Secondary | ICD-10-CM

## 2023-09-13 DIAGNOSIS — I739 Peripheral vascular disease, unspecified: Secondary | ICD-10-CM | POA: Diagnosis not present

## 2023-09-13 DIAGNOSIS — J449 Chronic obstructive pulmonary disease, unspecified: Secondary | ICD-10-CM | POA: Diagnosis not present

## 2023-09-13 DIAGNOSIS — I152 Hypertension secondary to endocrine disorders: Secondary | ICD-10-CM | POA: Diagnosis not present

## 2023-09-13 DIAGNOSIS — R61 Generalized hyperhidrosis: Secondary | ICD-10-CM

## 2023-09-13 LAB — POCT GLYCOSYLATED HEMOGLOBIN (HGB A1C): HbA1c, POC (controlled diabetic range): 7 % (ref 0.0–7.0)

## 2023-09-13 MED ORDER — SPIRIVA HANDIHALER 18 MCG IN CAPS: 1.0000 | ORAL_CAPSULE | Freq: Every day | RESPIRATORY_TRACT | 3 refills | Status: AC

## 2023-09-13 MED ORDER — DULOXETINE HCL 60 MG PO CPEP: ORAL_CAPSULE | ORAL | 1 refills | Status: AC

## 2023-09-13 MED ORDER — ROSUVASTATIN CALCIUM 20 MG PO TABS: 20.0000 mg | ORAL_TABLET | Freq: Every day | ORAL | 3 refills | Status: AC

## 2023-09-13 MED ORDER — ALBUTEROL SULFATE (2.5 MG/3ML) 0.083% IN NEBU: INHALATION_SOLUTION | RESPIRATORY_TRACT | 2 refills | Status: DC

## 2023-09-13 MED ORDER — CELECOXIB 200 MG PO CAPS: 200.0000 mg | ORAL_CAPSULE | Freq: Every day | ORAL | 3 refills | Status: DC

## 2023-09-13 NOTE — Patient Instructions (Signed)
 VISIT SUMMARY:  Stacy [name], a 63 year old female with diabetes, hypertension, hyperlipidemia, and COPD, came in for a follow-up visit. Her diabetes control has improved, and her blood pressure is well-managed. She has a lump in her right arm, shoulder pain, and tingling in her left arm. She also experiences excessive sweating and restless legs at night. She recently had a bout of diarrhea that has resolved.  YOUR PLAN:  -ANTECUBITAL FOSSA MASS: You have lumps in right  arm that might be muscle prominence or cysts. The size of the lump has increased slightly in size. We will refer you to an orthopedic specialist for further evaluation. Please inform the specialist about your previous ultrasound.  -TYPE 2 DIABETES MELLITUS: Your diabetes is under good control with your current medications, Farxiga  and metformin . Your A1c has improved to 7.0, and your blood glucose levels are well-managed. Keep up the good work with your diet and exercise.  -HYPERTENSION: Your blood pressure is well-controlled with your current medication, Diovan . Your home readings are consistently around 115/75 mmHg. Continue taking your medication as prescribed and limit your salt intake.  -HYPERLIPIDEMIA, STATIN-INDUCED MUSCLE CRAMPS: You have high cholesterol, which is being treated with atorvastatin . However, this medication is causing muscle cramps. We will switch you to rosuvastatin  (Crestor ) instead. The prescription will be sent to [facility].  -PERIPHERAL ARTERY DISEASE OF LOWER EXTREMITIES: You have peripheral artery disease but have not seen your vascular specialist in over two years. Despite nighttime cramps, your circulation seems good. Please schedule a follow-up with your vascular specialist.  -CHRONIC OBSTRUCTIVE PULMONARY DISEASE (COPD): Your COPD is well-managed with your current inhalers, Spiriva  and Symbicort . Continue using them as prescribed.  -OBSTRUCTIVE SLEEP APNEA, ON CPAP: Your sleep apnea  is being managed with a CPAP machine, which you use every night. Continue using your CPAP as directed.  -CARPAL TUNNEL SYNDROME, LEFT: You have carpal tunnel syndrome in your left arm and are planning to have surgery. We are waiting for the results of a neuroconductor study to confirm your eligibility for surgery.  -EXCESSIVE SWEATING, EVALUATION FOR POSSIBLE HYPERTHYROIDISM: You have been experiencing excessive sweating and some weight loss. We will order thyroid  function tests to check for hyperthyroidism, which is a condition where your thyroid  is overactive.  -RECENT ACUTE DIARRHEA, RESOLVED: You recently had a bout of diarrhea that has resolved with the use of Imodium . There is no need for further treatment at this time.  INSTRUCTIONS:  1. Follow up with your orthopedic specialist on September 16th for the lumps in your arms. 2. Schedule an appointment with your vascular specialist for your peripheral artery disease. 3. Continue taking your medications as prescribed and maintain your current lifestyle habits. 4. We will order thyroid  function tests to evaluate for hyperthyroidism. 5. Your prescription for rosuvastatin  (Crestor ) has been sent to [facility].

## 2023-09-13 NOTE — Progress Notes (Signed)
 Patient ID: Stacy [name] [facility], female    DOB: 09/12/1960  MRN: [id number]  CC: Diabetes (DM f/u. Med refill. [facility] MRI prior shara was denied /)   Subjective: Stacy [name] is a 63 y.o. female who presents for chronic ds management. 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 Cheryn Finn.    Discussed the use of AI scribe software for clinical note transcription with the patient, who gave verbal consent to proceed.  History of Present Illness Stacy [name] is a 63 year old female with diabetes, hypertension, hyperlipidemia, and COPD who presents for a follow-up visit.  DM: Results for orders placed or performed in visit on 09/13/23  POCT glycosylated hemoglobin (Hb A1C)   Collection Time: 09/13/23  9:04 AM  Result Value Ref Range   Hemoglobin A1C     HbA1c POC (<> result, manual entry)     HbA1c, POC (prediabetic range)     HbA1c, POC (controlled diabetic range) 7.0 0.0 - 7.0 %   *Note: Due to a large number of results and/or encounters for the requested time period, some results have not been displayed. A complete set of results can be found in Results Review.   Her most recent A1c is 7.0, down from 7.4 at her last visit. She continues on Farxiga  10 mg daily and metformin  750 mg daily. She reports morning blood glucose readings between 112 and 115, post-lunch readings between 116 and 118, and bedtime readings between 109 and 113. She reports doing well with her eating habits and does a lot of walking for exercise.  She has a soft tissue lump in the medial aspect of her right antecubital fossa, previously measured at 4x4 cm ultrasound was negative, but an MRI was denied by her  insurance. She has not yet shown the lump to her orthopedic doctor but is scheduled to see him on September 16th. She also experiences shoulder pain and tingling and numbness in her left arm, for which she is planning carpal tunnel surgery.  She experiences excessive sweating, particularly when indoors, and restless legs at night. Her air condition was out for 3 days.  Now fixed, she keeps her home temperature at 76 degrees and uses a fan.  She reports no major weight loss, although by our scale she has lost 10 pounds from last visit in April.  Reports having diarrhea over the past three days, which resolved with Imodium  one day ago. No fever, stomach cramps, or blood in stools during this episode. No recent abx use  HTN: managed with Diovan  (valsartan  hydrochlorothiazide ) 160/12.5 mg daily, and she takes a potassium supplement. Blood pressure readings are around 115/75. She limits salt intake and uses a salt substitute.  HL/PAD:  she takes atorvastatin  80 mg daily and clopidogrel . She experiences muscle cramps at night in both calf and feet. No pain or cramps when walking. She has not seen her vascular specialist in over two years.  COPD/OSA: uses Spiriva  and Symbicort  inhalers daily. She quit smoking in July 2024 and uses a CPAP machine every night.     Patient Active Problem List   Diagnosis Date Noted   Arthritis of carpometacarpal [facility]) joint of right thumb 03/26/2023   Carpal tunnel syndrome, right upper limb 03/26/2023  Status post revision of total replacement of right knee 05/12/2022   History of colonic polyps 03/20/2022   Failed total knee, right, subsequent encounter 03/02/2022   Loose right total knee arthroplasty (HCC) 03/02/2022   Mixed stress and urge urinary incontinence 12/04/2021   Functional fecal incontinence 12/04/2021   IBS (irritable bowel syndrome) 11/19/2021   Spondylolisthesis, lumbar region    Other spondylosis with radiculopathy, lumbar region    Other  secondary scoliosis, lumbar region    Fusion of spine of lumbar region 03/25/2021   Colon polyps 06/06/2020   Lupus 06/06/2020   Allergic rhinitis due to animal (cat) (dog) hair and dander 04/08/2020   Allergic rhinitis due to pollen 04/08/2020   Food allergy  04/08/2020   Acute medial meniscus tear, left, subsequent encounter 03/21/2020   Chronic pain of left knee 02/15/2020   Paresthesia of skin 11/16/2019   History of total knee replacement, right 11/16/2019   Tobacco abuse 11/16/2019   Centrilobular emphysema (HCC) 08/10/2019   Incidental lung nodule, > 3mm and < 8mm 08/10/2019   OSA on CPAP 08/10/2019   Dyspnea on exertion 08/02/2019   Hyperlipidemia 08/02/2019   Lumbar radiculopathy 04/24/2019   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   Post laminectomy syndrome 02/23/2019   Abnormality of gait 02/23/2019   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   Unilateral primary osteoarthritis, left hip 12/28/2018   Lesion of skin of left ear 12/26/2018   Primary osteoarthritis of left hip 12/02/2018   Iron  deficiency anemia 10/16/2018   Chronic pain syndrome 09/08/2018   Chronic pain of right knee 08/11/2018   Status post lumbar laminectomy 07/15/2018   Peripheral arterial disease (HCC) 04/05/2018   Moderate persistent asthma without complication 06/29/2017   Environmental and seasonal allergies 06/29/2017   Controlled type 2 diabetes mellitus with diabetic polyneuropathy, without long-term current use of insulin  (HCC) 06/29/2017   Perennial allergic rhinitis 04/08/2017   Sensorineural hearing loss (SNHL), bilateral 04/08/2017   Chronic pansinusitis 03/25/2017   Eustachian tube dysfunction, bilateral 03/25/2017   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   Herniation of lumbar intervertebral disc with radiculopathy 10/02/2016    Class: Chronic   Alopecia areata 08/19/2016   Chondromalacia of both patellae 06/03/2015    Class: Chronic   Spinal stenosis, lumbar region, with neurogenic  claudication 06/03/2015   Tobacco use disorder 04/25/2015   DJD (degenerative joint disease) of knee 01/04/2015   Hemorrhoid 11/14/2014   Gout of big toe 07/19/2014   Essential hypertension 08/14/2013   Gastroesophageal reflux disease without esophagitis 08/14/2013   COPD (chronic obstructive pulmonary disease) (HCC) 04/17/2011     Current Outpatient Medications on File Prior to Visit  Medication Sig Dispense Refill   Accu-Chek Softclix Lancets lancets USE AS INSTRUCTED 3 TIMES A DAY 100 each 0   albuterol  (PROVENTIL ) (2.5 MG/3ML) 0.083% nebulizer solution USE ONE VIAL FOR UP TO 3 TIMES DAILY. 360 mL 0   allopurinol  (ZYLOPRIM ) 100 MG tablet TAKE 1 TABLET (100 MG TOTAL) BY MOUTH DAILY.(AM) 90 tablet 1   atorvastatin  (LIPITOR) 40 MG tablet TAKE 1 TABLET (40 MG TOTAL) BY MOUTH DAILY. (BEDTIME) 90 tablet 1   Blood Glucose Monitoring Suppl (ACCU-CHEK AVIVA PLUS) w/Device KIT 1 each by Does not apply route 3 (three) times daily. 1 kit 0   celecoxib  (CELEBREX ) 200 MG capsule TAKE 1 CAPSULE (200 MG TOTAL) BY MOUTH DAILY. (AM) 30 capsule 3   clopidogrel  (PLAVIX ) 75 MG tablet TAKE 1 TABLET (75 MG TOTAL) BY MOUTH DAILY. (AM) 90 tablet  1   diclofenac  Sodium (VOLTAREN ) 1 % GEL Apply 2 g topically 4 (four) times daily. 350 g 5   DULoxetine  (CYMBALTA ) 60 MG capsule TAKE 1 CAPSULE (60 MG TOTAL) BY MOUTH DAILY (AM) 90 capsule 1   EPINEPHrine  0.3 mg/0.3 mL IJ SOAJ injection 0.3 mg IM x 1 PRN for allergic reaction 1 each 1   FARXIGA  10 MG TABS tablet TAKE 1 TABLET (10 MG TOTAL) BY MOUTH DAILY BEFORE BREAKFAST. 90 tablet 1   [name] 325 (65 Fe) MG tablet TAKE ONE TABLET ( 325 MG ) BY MOUTH DAILY (AM) 100 tablet 1   fluticasone  (FLONASE ) 50 MCG/ACT nasal spray PLACE 1 SPRAY INTO BOTH NOSTRILS DAILY. 16 g 0   Glucagon  1 MG/0.2ML SOSY 1 mg arrange subcutaneously as needed for severe symptomatic low blood sugar 0.2 mL 0   glucose blood (ACCU-CHEK AVIVA PLUS) test strip USE AS INSTRUCTED 3 TIMES A DAY 100 each 0    glycopyrrolate  (ROBINUL ) 2 MG tablet Take 2 mg by mouth 3 (three) times daily.     hydrOXYzine  (ATARAX ) 10 MG tablet TAKE 1 TABLET IN THE MORNING, THEN 1 TABLET AT NOON AND 2 TABLETS IN THE EVENING AS NEEDED. 120 tablet 1   Lancets (ACCU-CHEK SOFT TOUCH) lancets Use as instructed 100 each 12   loratadine  (CLARITIN ) 10 MG tablet TAKE 1 TABLET (10 MG TOTAL) BY MOUTH DAILY AS NEEDED FOR ALLERGIES. (AM) 90 tablet 1   melatonin 5 MG TABS Take 5 mg by mouth at bedtime.     metFORMIN  (GLUCOPHAGE -XR) 750 MG 24 hr tablet TAKE 1 TABLET (750 MG TOTAL) BY MOUTH DAILY WITH BREAKFAST. 90 tablet 1   methocarbamol  (ROBAXIN ) 500 MG tablet TAKE 1 TABLET (500 MG TOTAL) BY MOUTH EVERY 6 (SIX) HOURS AS NEEDED FOR MUSCLE SPASMS. 40 tablet 1   Methylnaltrexone  Bromide 8 MG/0.4ML SOLN Take 0.4 mLs by mouth as needed. Take once daily as needed 2.1 mL 0   montelukast  (SINGULAIR ) 10 MG tablet Take 10 mg by mouth daily.     Multiple Vitamins-Minerals (CENTRUM SILVER 50+WOMEN PO) Take 1 tablet by mouth daily.     pantoprazole  (PROTONIX ) 40 MG tablet Take 40 mg by mouth daily.     potassium chloride  SA (KLOR-CON  M) 20 MEQ tablet TAKE 1 TABLET (20 MEQ TOTAL) BY MOUTH DAILY.(NOON ) 90 tablet 1   Respiratory Therapy Supplies (FLUTTER) DEVI Use after breathing treatment 4 times daily 1 each 0   SPIRIVA  HANDIHALER 18 MCG inhalation capsule PLACE 1 CAPSULE (18 MCG TOTAL) INTO INHALER AND INHALE DAILY. 90 capsule 0   SYMBICORT  160-4.5 MCG/ACT inhaler Inhale 1 puff into the lungs 2 (two) times daily.     triamcinolone  cream (KENALOG ) 0.1 % Apply 1 Application topically 2 (two) times daily as needed (irritation). 45 g 1   valsartan -hydrochlorothiazide  (DIOVAN -HCT) 160-12.5 MG tablet TAKE ONE TABLET BY MOUTH ONCE DAILY (AM) 90 tablet 1   [DISCONTINUED] Fluticasone -Salmeterol (ADVAIR) 500-50 MCG/DOSE AEPB Inhale 1 puff into the lungs every 12 (twelve) hours.       [DISCONTINUED] lisinopril (PRINIVIL,ZESTRIL) 40 MG tablet Take 40 mg by  mouth daily.   (Patient not taking: Reported on 05/10/2023)     Current Facility-Administered Medications on File Prior to Visit  Medication Dose Route Frequency Provider Last Rate Last Admin   glucose chewable tablet 12 g  3 tablet Oral Once Vicci Barnie NOVAK, MD        Allergies  Allergen Reactions   Other Shortness Of Breath  UNSPECIFIED AGENTS Allergic to perfumes and cleaning products   Shellfish Allergy  Anaphylaxis    Per allergy  test.   Ace Inhibitors Cough and Other (See Comments)        Celebrex  [Celecoxib ]      upset stomach  Pt is taking med*   [employer] Other (See Comments)   Aspirin  Nausea Only and Other (See Comments)    stomach upset  Other reaction(s): Nausea Only    Social History   Socioeconomic History   Marital status: Married    Spouse name: Not on file   Number of children: 2   Years of education: Not on file   Highest education level: Not on file  Occupational History   Occupation: unemployed    Employer: UNEMPLOYED  Tobacco Use   Smoking status: Former   Smokeless tobacco: Never  Advertising account planner   Vaping status: Never Used  Substance and Sexual Activity   Alcohol use: No   Drug use: No   Sexual activity: Yes    Birth control/protection: Surgical, Post-menopausal    Comment: tubal ligation  Other Topics Concern   Not on file  Social History Narrative   Left Handed    Lives in a two story apartment. With husband   Drinks Caffeine 1 cup   retired   Social Drivers of [employer] Strain: Medium Risk (01/08/2023)   Overall Financial Resource Strain (CARDIA)    Difficulty of Paying Living Expenses: Somewhat hard  Food Insecurity: Food Insecurity Present (01/08/2023)   Hunger Vital Sign    Worried About Running Out of Food in the Last Year: Sometimes true    Ran Out of Food in the Last Year: Never true  Transportation Needs: No Transportation Needs (01/08/2023)   PRAPARE - Scientist, research (physical sciences) (Medical): No    Lack of Transportation (Non-Medical): No  Physical Activity: Sufficiently Active (01/08/2023)   Exercise Vital Sign    Days of Exercise per Week: 7 days    Minutes of Exercise per Session: 30 min  Stress: Stress Concern Present (01/08/2023)   [employer] of Occupational Health - Occupational Stress Questionnaire    Feeling of Stress : Rather much  Social Connections: Socially Integrated (01/08/2023)   Social Connection and Isolation Panel    Frequency of Communication with Friends and Family: Twice a week    Frequency of Social Gatherings with Friends and Family: More than three times a week    Attends Religious Services: 1 to 4 times per year    Active Member of [employer] or Organizations: Yes    Attends Banker Meetings: Never    Marital Status: Married  Catering manager Violence: Not At Risk (01/08/2023)   Humiliation, Afraid, Rape, and Kick questionnaire    Fear of Current or Ex-Partner: No    Emotionally Abused: No    Physically Abused: No    Sexually Abused: No    Family History  Problem Relation Age of Onset   Hypertension Father    Cancer Father    Heart disease Mother    Asthma Son        had as a child   Heart disease Sister    Breast cancer Sister    Hypertension Brother     Past Surgical History:  Procedure Laterality Date   BACK SURGERY     feb 21, 23   CARPAL TUNNEL RELEASE Right 03/26/2023   Procedure: RIGHT CARPAL TUNNEL RELEASE;  Surgeon: Arlinda,  Anshul, MD;  Location: [facility];  Service: Orthopedics;  Laterality: Right;   CARPOMETACARPEL SUSPENSION PLASTY Right 03/26/2023   Procedure: RIGHT THUMB CARPOMETACARPEL (CMC)  ARTHROPLASTY WITH INTERNAL BRACE;  Surgeon: Arlinda Buster, MD;  Location: [facility];  Service: Orthopedics;  Laterality: Right;   CHOLECYSTECTOMY     COLONOSCOPY     ENDOMETRIAL ABLATION  10/2010   HERNIA REPAIR     umbicial hernia   JOINT REPLACEMENT Left  03/14/2019   Dr. Vernetta hip   KNEE ARTHROSCOPY Left    06/07/2017 Dr. Addie of [facility]   KNEE ARTHROSCOPY Left 03/21/2020   Procedure: LEFT KNEE ARTHROSCOPY WITH PARTIAL MEDIAL MENISCECTOMY;  Surgeon: Vernetta Lonni GRADE, MD;  Location: [facility];  Service: Orthopedics;  Laterality: Left;   KNEE CLOSED REDUCTION Right 12/06/2015   Procedure: CLOSED MANIPULATION RIGHT KNEE;  Surgeon: Lynwood FORBES Better, MD;  Location: [facility];  Service: Orthopedics;  Laterality: Right;   KNEE CLOSED REDUCTION Right 01/17/2016   Procedure: CLOSED MANIPULATION RIGHT KNEE;  Surgeon: Lynwood FORBES Better, MD;  Location: [facility];  Service: Orthopedics;  Laterality: Right;   KNEE JOINT MANIPULATION Right 12/06/2015   LACRIMAL TUBE INSERTION Bilateral 03/01/2019   Procedure: LACRIMAL TUBE INSERTION;  Surgeon: Jacques Sharper, MD;  Location: [facility];  Service: Ophthalmology;  Laterality: Bilateral;   LACRIMAL TUBE REMOVAL Bilateral 05/03/2019   Procedure: BILATERAL NASOLACRIMAL DUCT PROBING, IIRIGATION AND TUBE REMOVAL BOTH EYES;  Surgeon: Jacques Sharper, MD;  Location: [facility];  Service: Ophthalmology;  Laterality: Bilateral;   LUMBAR LAMINECTOMY/DECOMPRESSION MICRODISCECTOMY N/A 06/03/2015   Procedure: Bilateral lateral recess decompression L2-3, L3-4, L4-5;  Surgeon: Lynwood FORBES Better, MD;  Location: [facility];  Service: Orthopedics;  Laterality: N/A;   LUMBAR LAMINECTOMY/DECOMPRESSION MICRODISCECTOMY N/A 10/02/2016   Procedure: Right L5-S1 Lateral Recess Decompression  microdiscectomy;  Surgeon: Better Lynwood FORBES, MD;  Location: [facility];  Service: Orthopedics;  Laterality: N/A;   LUMBAR LAMINECTOMY/DECOMPRESSION MICRODISCECTOMY N/A 07/15/2018   Procedure: LEFT L3-4 MICRODISCECTOMY;  Surgeon: Better Lynwood FORBES, MD;  Location: [facility];  Service: Orthopedics;  Laterality: N/A;   svd      x 2   TEAR DUCT PROBING Bilateral 03/01/2019   Procedure: TEAR DUCT PROBING WITH IRRIGATION;   Surgeon: Jacques Sharper, MD;  Location: [facility];  Service: Ophthalmology;  Laterality: Bilateral;   TOTAL HIP ARTHROPLASTY Left 03/14/2019   Procedure: LEFT TOTAL HIP ARTHROPLASTY ANTERIOR APPROACH;  Surgeon: Vernetta Lonni GRADE, MD;  Location: [facility];  Service: Orthopedics;  Laterality: Left;   TOTAL KNEE ARTHROPLASTY Right 09/06/2015   Procedure: RIGHT TOTAL KNEE ARTHROPLASTY;  Surgeon: Lynwood FORBES Better, MD;  Location: [facility];  Service: Orthopedics;  Laterality: Right;   TOTAL KNEE REVISION Right 05/12/2022   Procedure: RIGHT TOTAL KNEE REVISION ARTHROPLASTY;  Surgeon: Vernetta Lonni GRADE, MD;  Location: [facility];  Service: Orthopedics;  Laterality: Right;   TUBAL LIGATION     UPPER GASTROINTESTINAL ENDOSCOPY  04/28/2011    ROS: Review of Systems Negative except as stated above  PHYSICAL EXAM: BP 132/75 (BP Location: Left Arm, Patient Position: Sitting, Cuff Size: Normal)   Pulse 69   Temp 98.6 F (37 C) (Oral)   Ht 5' 6 (1.676 m)   Wt 154 lb (69.9 kg)   LMP 09/01/2010   SpO2 97%   BMI 24.86 kg/m   Wt Readings from Last 3 Encounters:  09/13/23 154 lb (69.9 kg)  05/10/23 164 lb (74.4 kg)  05/05/23 156 lb (70.8 kg)    Physical Exam  General appearance - alert, well appearing, and in no distress Mental status - normal mood, behavior, speech, dress, motor activity, and thought processes Neck - no thyroid  enlargement or nodules Chest - clear to auscultation, no wheezes, rales or rhonchi, symmetric air entry Heart - normal rate, regular rhythm, normal S1, S2, no murmurs, rubs, clicks or gallops Musculoskeletal -right arm: Still persisting is what appears to be soft tissue mass where the lower edge is partially in the upper medial aspect of the antecubital fossa measures 5 x 4.5 cm.  It is soft and not tender to touch.  Noted to have a smaller 1 in similar location in the left arm. Extremities - legs warm, DP/PT/popliteal pulses 3+ BL      Latest Ref Rng &  Units 06/09/2023    8:32 AM 03/19/2023   12:30 PM 06/17/2022   10:03 AM  CMP  Glucose 70 - 99 mg/dL 878  868    BUN 8 - 27 mg/dL 11  9    Creatinine 9.42 - 1.00 mg/dL 9.00  9.11    Sodium 865 - 144 mmol/L 138  139    Potassium 3.5 - 5.2 mmol/L 4.3  3.7    Chloride 96 - 106 mmol/L 99  101    CO2 20 - 29 mmol/L 21  24    Calcium  8.7 - 10.3 mg/dL 9.9  9.9  9.6   Total Protein 6.0 - 8.5 g/dL 7.2     Total Bilirubin 0.0 - 1.2 mg/dL 0.2     Alkaline Phos 44 - 121 IU/L 136     AST 0 - 40 IU/L 14     ALT 0 - 32 IU/L 10      Lipid Panel     Component Value Date/Time   CHOL 163 06/09/2023 0832   TRIG 196 (H) 06/09/2023 0832   HDL 46 06/09/2023 0832   CHOLHDL 3.5 06/09/2023 0832   CHOLHDL 4.2 07/19/2014 1202   VLDL 24 07/19/2014 1202   LDLCALC 84 06/09/2023 0832    CBC    Component Value Date/Time   WBC 7.4 06/01/2022 1132   RBC 4.08 06/01/2022 1132   HGB 11.0 (L) 06/01/2022 1132   HGB 11.3 05/01/2021 1526   HCT 33.5 (L) 06/01/2022 1132   HCT 34.7 05/01/2021 1526   PLT 449 (H) 06/01/2022 1132   PLT 297 05/01/2021 1526   MCV 82.1 06/01/2022 1132   MCV 81 05/01/2021 1526   MCH 27.0 06/01/2022 1132   MCHC 32.8 06/01/2022 1132   RDW 14.6 06/01/2022 1132   RDW 15.7 (H) 05/01/2021 1526   LYMPHSABS 2.4 06/01/2022 1132   LYMPHSABS 4.1 (H) 10/14/2018 1704   MONOABS 0.5 06/01/2022 1132   EOSABS 0.1 06/01/2022 1132   EOSABS 0.1 10/14/2018 1704   BASOSABS 0.0 06/01/2022 1132   BASOSABS 0.0 10/14/2018 1704    ASSESSMENT AND PLAN: 1. Diabetes mellitus treated with oral medication (HCC) (Primary) Controlled.  Continue Farxiga  10 mg daily and metformin  750 mg daily.  Continue healthy eating habits and monitoring of blood sugars. - POCT glycosylated hemoglobin (Hb A1C) - DULoxetine  (CYMBALTA ) 60 MG capsule; TAKE 1 CAPSULE (60 MG TOTAL) BY MOUTH DAILY (AM)  Dispense: 90 capsule; Refill: 1  2. Hypertension associated with type 2 diabetes mellitus (HCC) At goal.  Continue Diovan /HCTZ  160/12.5 mg once a day.  3. Chronic obstructive pulmonary disease, unspecified COPD type (HCC) Stable.  Continue Spiriva  and  Symbicort  - albuterol  (PROVENTIL ) (2.5 MG/3ML) 0.083% nebulizer solution; USE ONE VIAL FOR UP TO 3 TIMES DAILY.  Dispense: 360 mL; Refill: 2 - SPIRIVA  HANDIHALER 18 MCG inhalation capsule; Place 1 capsule (18 mcg total) into inhaler and inhale daily.  Dispense: 90 capsule; Refill: 3  4. Mass of right forearm I will send her to her orthopedics to specifically look at this area to help determine whether this is a cyst/mass or prominence of the musculature.  Of note it has increased in size some compared to when I saw her 4 months ago.  We may need to try to petition her insurance again for the MRI. - AMB referral to orthopedics  5. Excessive sweating Advised to try to keep temperature in her house at a comfortable level. - TSH+T4F+T3Free  6. Bilateral leg cramps Most likely due to atorvastatin .  See #7 below.  7. PAD (peripheral artery disease) (HCC) Cramps most likely due to atorvastatin  as she has good peripheral pulses.  We will change the atorvastatin  to Crestor  instead to see if she tolerates it better. - rosuvastatin  (CRESTOR ) 20 MG tablet; Take 1 tablet (20 mg total) by mouth daily. Stop Atorvastatin   Dispense: 90 tablet; Refill: 3    Patient was given the opportunity to ask questions.  Patient verbalized understanding of the plan and was able to repeat key elements of the plan.   This documentation was completed using Paediatric nurse.  Any transcriptional errors are unintentional.  Orders Placed This Encounter  Procedures   POCT glycosylated hemoglobin (Hb A1C)     Requested Prescriptions   Pending Prescriptions Disp Refills   albuterol  (PROVENTIL ) (2.5 MG/3ML) 0.083% nebulizer solution 360 mL 0    Sig: USE ONE VIAL FOR UP TO 3 TIMES DAILY.   celecoxib  (CELEBREX ) 200 MG capsule 30 capsule 3   DULoxetine  (CYMBALTA ) 60 MG capsule  90 capsule 1    Sig: TAKE 1 CAPSULE (60 MG TOTAL) BY MOUTH DAILY (AM)   SPIRIVA  HANDIHALER 18 MCG inhalation capsule 90 capsule 0    Sig: Place 1 capsule (18 mcg total) into inhaler and inhale daily.    No follow-ups on file.  Barnie Louder, MD, FACP

## 2023-09-13 NOTE — Progress Notes (Signed)
 Patient ID: Stacy [name] [facility], female    DOB: 1960/09/25  MRN: [id number]  CC: Diabetes (DM f/u. Med refill. [facility] MRI prior shara was denied /)   Subjective: Stacy [name] is a 63 y.o. female who presents for chronic ds management. 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 Cheryn Finn.    Discussed the use of AI scribe software for clinical note transcription with the patient, who gave verbal consent to proceed.  History of Present Illness Stacy [name] is a 63 year old female with diabetes, hypertension, hyperlipidemia, and COPD who presents for a follow-up visit.  DM: Results for orders placed or performed in visit on 09/13/23  POCT glycosylated hemoglobin (Hb A1C)   Collection Time: 09/13/23  9:04 AM  Result Value Ref Range   Hemoglobin A1C     HbA1c POC (<> result, manual entry)     HbA1c, POC (prediabetic range)     HbA1c, POC (controlled diabetic range) 7.0 0.0 - 7.0 %   *Note: Due to a large number of results and/or encounters for the requested time period, some results have not been displayed. A complete set of results can be found in Results Review.   Her most recent A1c is 7.0, down from 7.4 at her last visit. She continues on Farxiga  10 mg daily and metformin  750 mg daily. She reports morning blood glucose readings between 112 and 115, post-lunch readings between 116 and 118, and bedtime readings between 109 and 113. She reports doing well with her eating habits and does a lot of walking for exercise.  She has a soft tissue lump in the medial aspect of her right antecubital fossa, previously measured at 4x4 cm ultrasound was negative, but an MRI was denied by her  insurance. She has not yet shown the lump to her orthopedic doctor but is scheduled to see him on September 16th. She also experiences shoulder pain and tingling and numbness in her left arm, for which she is planning carpal tunnel surgery.  She experiences excessive sweating, particularly when indoors, and restless legs at night. Her air condition was out for 3 days.  Now fixed, she keeps her home temperature at 76 degrees and uses a fan.  She reports no major weight loss, although by our scale she has lost 10 pounds from last visit in April.  Reports having diarrhea over the past three days, which resolved with Imodium  one day ago. No fever, stomach cramps, or blood in stools during this episode. No recent abx use  HTN: managed with Diovan  (valsartan  hydrochlorothiazide ) 160/12.5 mg daily, and she takes a potassium supplement. Blood pressure readings are around 115/75. She limits salt intake and uses a salt substitute.  HL/PAD:  she takes atorvastatin  80 mg daily and clopidogrel . She experiences muscle cramps at night in both calf and feet. No pain or cramps when walking. She has not seen her vascular specialist in over two years.  COPD/OSA: uses Spiriva  and Symbicort  inhalers daily. She quit smoking in July 2024 and uses a CPAP machine every night.     Patient Active Problem List   Diagnosis Date Noted   Arthritis of carpometacarpal [facility]) joint of right thumb 03/26/2023   Carpal tunnel syndrome, right upper limb 03/26/2023  Status post revision of total replacement of right knee 05/12/2022   History of colonic polyps 03/20/2022   Failed total knee, right, subsequent encounter 03/02/2022   Loose right total knee arthroplasty (HCC) 03/02/2022   Mixed stress and urge urinary incontinence 12/04/2021   Functional fecal incontinence 12/04/2021   IBS (irritable bowel syndrome) 11/19/2021   Spondylolisthesis, lumbar region    Other spondylosis with radiculopathy, lumbar region    Other  secondary scoliosis, lumbar region    Fusion of spine of lumbar region 03/25/2021   Colon polyps 06/06/2020   Lupus 06/06/2020   Allergic rhinitis due to animal (cat) (dog) hair and dander 04/08/2020   Allergic rhinitis due to pollen 04/08/2020   Food allergy  04/08/2020   Acute medial meniscus tear, left, subsequent encounter 03/21/2020   Chronic pain of left knee 02/15/2020   Paresthesia of skin 11/16/2019   History of total knee replacement, right 11/16/2019   Tobacco abuse 11/16/2019   Centrilobular emphysema (HCC) 08/10/2019   Incidental lung nodule, > 3mm and < 8mm 08/10/2019   OSA on CPAP 08/10/2019   Dyspnea on exertion 08/02/2019   Hyperlipidemia 08/02/2019   Lumbar radiculopathy 04/24/2019   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   Post laminectomy syndrome 02/23/2019   Abnormality of gait 02/23/2019   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   Unilateral primary osteoarthritis, left hip 12/28/2018   Lesion of skin of left ear 12/26/2018   Primary osteoarthritis of left hip 12/02/2018   Iron  deficiency anemia 10/16/2018   Chronic pain syndrome 09/08/2018   Chronic pain of right knee 08/11/2018   Status post lumbar laminectomy 07/15/2018   Peripheral arterial disease (HCC) 04/05/2018   Moderate persistent asthma without complication 06/29/2017   Environmental and seasonal allergies 06/29/2017   Controlled type 2 diabetes mellitus with diabetic polyneuropathy, without long-term current use of insulin  (HCC) 06/29/2017   Perennial allergic rhinitis 04/08/2017   Sensorineural hearing loss (SNHL), bilateral 04/08/2017   Chronic pansinusitis 03/25/2017   Eustachian tube dysfunction, bilateral 03/25/2017   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   Herniation of lumbar intervertebral disc with radiculopathy 10/02/2016    Class: Chronic   Alopecia areata 08/19/2016   Chondromalacia of both patellae 06/03/2015    Class: Chronic   Spinal stenosis, lumbar region, with neurogenic  claudication 06/03/2015   Tobacco use disorder 04/25/2015   DJD (degenerative joint disease) of knee 01/04/2015   Hemorrhoid 11/14/2014   Gout of big toe 07/19/2014   Essential hypertension 08/14/2013   Gastroesophageal reflux disease without esophagitis 08/14/2013   COPD (chronic obstructive pulmonary disease) (HCC) 04/17/2011     Current Outpatient Medications on File Prior to Visit  Medication Sig Dispense Refill   Accu-Chek Softclix Lancets lancets USE AS INSTRUCTED 3 TIMES A DAY 100 each 0   albuterol  (PROVENTIL ) (2.5 MG/3ML) 0.083% nebulizer solution USE ONE VIAL FOR UP TO 3 TIMES DAILY. 360 mL 0   allopurinol  (ZYLOPRIM ) 100 MG tablet TAKE 1 TABLET (100 MG TOTAL) BY MOUTH DAILY.(AM) 90 tablet 1   atorvastatin  (LIPITOR) 40 MG tablet TAKE 1 TABLET (40 MG TOTAL) BY MOUTH DAILY. (BEDTIME) 90 tablet 1   Blood Glucose Monitoring Suppl (ACCU-CHEK AVIVA PLUS) w/Device KIT 1 each by Does not apply route 3 (three) times daily. 1 kit 0   celecoxib  (CELEBREX ) 200 MG capsule TAKE 1 CAPSULE (200 MG TOTAL) BY MOUTH DAILY. (AM) 30 capsule 3   clopidogrel  (PLAVIX ) 75 MG tablet TAKE 1 TABLET (75 MG TOTAL) BY MOUTH DAILY. (AM) 90 tablet  1   diclofenac  Sodium (VOLTAREN ) 1 % GEL Apply 2 g topically 4 (four) times daily. 350 g 5   DULoxetine  (CYMBALTA ) 60 MG capsule TAKE 1 CAPSULE (60 MG TOTAL) BY MOUTH DAILY (AM) 90 capsule 1   EPINEPHrine  0.3 mg/0.3 mL IJ SOAJ injection 0.3 mg IM x 1 PRN for allergic reaction 1 each 1   FARXIGA  10 MG TABS tablet TAKE 1 TABLET (10 MG TOTAL) BY MOUTH DAILY BEFORE BREAKFAST. 90 tablet 1   [name] 325 (65 Fe) MG tablet TAKE ONE TABLET ( 325 MG ) BY MOUTH DAILY (AM) 100 tablet 1   fluticasone  (FLONASE ) 50 MCG/ACT nasal spray PLACE 1 SPRAY INTO BOTH NOSTRILS DAILY. 16 g 0   Glucagon  1 MG/0.2ML SOSY 1 mg arrange subcutaneously as needed for severe symptomatic low blood sugar 0.2 mL 0   glucose blood (ACCU-CHEK AVIVA PLUS) test strip USE AS INSTRUCTED 3 TIMES A DAY 100 each 0    glycopyrrolate  (ROBINUL ) 2 MG tablet Take 2 mg by mouth 3 (three) times daily.     hydrOXYzine  (ATARAX ) 10 MG tablet TAKE 1 TABLET IN THE MORNING, THEN 1 TABLET AT NOON AND 2 TABLETS IN THE EVENING AS NEEDED. 120 tablet 1   Lancets (ACCU-CHEK SOFT TOUCH) lancets Use as instructed 100 each 12   loratadine  (CLARITIN ) 10 MG tablet TAKE 1 TABLET (10 MG TOTAL) BY MOUTH DAILY AS NEEDED FOR ALLERGIES. (AM) 90 tablet 1   melatonin 5 MG TABS Take 5 mg by mouth at bedtime.     metFORMIN  (GLUCOPHAGE -XR) 750 MG 24 hr tablet TAKE 1 TABLET (750 MG TOTAL) BY MOUTH DAILY WITH BREAKFAST. 90 tablet 1   methocarbamol  (ROBAXIN ) 500 MG tablet TAKE 1 TABLET (500 MG TOTAL) BY MOUTH EVERY 6 (SIX) HOURS AS NEEDED FOR MUSCLE SPASMS. 40 tablet 1   Methylnaltrexone  Bromide 8 MG/0.4ML SOLN Take 0.4 mLs by mouth as needed. Take once daily as needed 2.1 mL 0   montelukast  (SINGULAIR ) 10 MG tablet Take 10 mg by mouth daily.     Multiple Vitamins-Minerals (CENTRUM SILVER 50+WOMEN PO) Take 1 tablet by mouth daily.     pantoprazole  (PROTONIX ) 40 MG tablet Take 40 mg by mouth daily.     potassium chloride  SA (KLOR-CON  M) 20 MEQ tablet TAKE 1 TABLET (20 MEQ TOTAL) BY MOUTH DAILY.(NOON ) 90 tablet 1   Respiratory Therapy Supplies (FLUTTER) DEVI Use after breathing treatment 4 times daily 1 each 0   SPIRIVA  HANDIHALER 18 MCG inhalation capsule PLACE 1 CAPSULE (18 MCG TOTAL) INTO INHALER AND INHALE DAILY. 90 capsule 0   SYMBICORT  160-4.5 MCG/ACT inhaler Inhale 1 puff into the lungs 2 (two) times daily.     triamcinolone  cream (KENALOG ) 0.1 % Apply 1 Application topically 2 (two) times daily as needed (irritation). 45 g 1   valsartan -hydrochlorothiazide  (DIOVAN -HCT) 160-12.5 MG tablet TAKE ONE TABLET BY MOUTH ONCE DAILY (AM) 90 tablet 1   [DISCONTINUED] Fluticasone -Salmeterol (ADVAIR) 500-50 MCG/DOSE AEPB Inhale 1 puff into the lungs every 12 (twelve) hours.       [DISCONTINUED] lisinopril (PRINIVIL,ZESTRIL) 40 MG tablet Take 40 mg by  mouth daily.   (Patient not taking: Reported on 05/10/2023)     Current Facility-Administered Medications on File Prior to Visit  Medication Dose Route Frequency Provider Last Rate Last Admin   glucose chewable tablet 12 g  3 tablet Oral Once Vicci Barnie NOVAK, MD        Allergies  Allergen Reactions   Other Shortness Of Breath  UNSPECIFIED AGENTS Allergic to perfumes and cleaning products   Shellfish Allergy  Anaphylaxis    Per allergy  test.   Ace Inhibitors Cough and Other (See Comments)        Celebrex  [Celecoxib ]      upset stomach  Pt is taking med*   [employer] Other (See Comments)   Aspirin  Nausea Only and Other (See Comments)    stomach upset  Other reaction(s): Nausea Only    Social History   Socioeconomic History   Marital status: Married    Spouse name: Not on file   Number of children: 2   Years of education: Not on file   Highest education level: Not on file  Occupational History   Occupation: unemployed    Employer: UNEMPLOYED  Tobacco Use   Smoking status: Former   Smokeless tobacco: Never  Advertising account planner   Vaping status: Never Used  Substance and Sexual Activity   Alcohol use: No   Drug use: No   Sexual activity: Yes    Birth control/protection: Surgical, Post-menopausal    Comment: tubal ligation  Other Topics Concern   Not on file  Social History Narrative   Left Handed    Lives in a two story apartment. With husband   Drinks Caffeine 1 cup   retired   Social Drivers of [employer] Strain: Medium Risk (01/08/2023)   Overall Financial Resource Strain (CARDIA)    Difficulty of Paying Living Expenses: Somewhat hard  Food Insecurity: Food Insecurity Present (01/08/2023)   Hunger Vital Sign    Worried About Running Out of Food in the Last Year: Sometimes true    Ran Out of Food in the Last Year: Never true  Transportation Needs: No Transportation Needs (01/08/2023)   PRAPARE - Scientist, research (physical sciences) (Medical): No    Lack of Transportation (Non-Medical): No  Physical Activity: Sufficiently Active (01/08/2023)   Exercise Vital Sign    Days of Exercise per Week: 7 days    Minutes of Exercise per Session: 30 min  Stress: Stress Concern Present (01/08/2023)   [employer] of Occupational Health - Occupational Stress Questionnaire    Feeling of Stress : Rather much  Social Connections: Socially Integrated (01/08/2023)   Social Connection and Isolation Panel    Frequency of Communication with Friends and Family: Twice a week    Frequency of Social Gatherings with Friends and Family: More than three times a week    Attends Religious Services: 1 to 4 times per year    Active Member of [employer] or Organizations: Yes    Attends Banker Meetings: Never    Marital Status: Married  Catering manager Violence: Not At Risk (01/08/2023)   Humiliation, Afraid, Rape, and Kick questionnaire    Fear of Current or Ex-Partner: No    Emotionally Abused: No    Physically Abused: No    Sexually Abused: No    Family History  Problem Relation Age of Onset   Hypertension Father    Cancer Father    Heart disease Mother    Asthma Son        had as a child   Heart disease Sister    Breast cancer Sister    Hypertension Brother     Past Surgical History:  Procedure Laterality Date   BACK SURGERY     feb 21, 23   CARPAL TUNNEL RELEASE Right 03/26/2023   Procedure: RIGHT CARPAL TUNNEL RELEASE;  Surgeon: Arlinda,  Anshul, MD;  Location: [facility];  Service: Orthopedics;  Laterality: Right;   CARPOMETACARPEL SUSPENSION PLASTY Right 03/26/2023   Procedure: RIGHT THUMB CARPOMETACARPEL (CMC)  ARTHROPLASTY WITH INTERNAL BRACE;  Surgeon: Arlinda Buster, MD;  Location: [facility];  Service: Orthopedics;  Laterality: Right;   CHOLECYSTECTOMY     COLONOSCOPY     ENDOMETRIAL ABLATION  10/2010   HERNIA REPAIR     umbicial hernia   JOINT REPLACEMENT Left  03/14/2019   Dr. Vernetta hip   KNEE ARTHROSCOPY Left    06/07/2017 Dr. Addie of [facility]   KNEE ARTHROSCOPY Left 03/21/2020   Procedure: LEFT KNEE ARTHROSCOPY WITH PARTIAL MEDIAL MENISCECTOMY;  Surgeon: Vernetta Lonni GRADE, MD;  Location: [facility];  Service: Orthopedics;  Laterality: Left;   KNEE CLOSED REDUCTION Right 12/06/2015   Procedure: CLOSED MANIPULATION RIGHT KNEE;  Surgeon: Lynwood FORBES Better, MD;  Location: [facility];  Service: Orthopedics;  Laterality: Right;   KNEE CLOSED REDUCTION Right 01/17/2016   Procedure: CLOSED MANIPULATION RIGHT KNEE;  Surgeon: Lynwood FORBES Better, MD;  Location: [facility];  Service: Orthopedics;  Laterality: Right;   KNEE JOINT MANIPULATION Right 12/06/2015   LACRIMAL TUBE INSERTION Bilateral 03/01/2019   Procedure: LACRIMAL TUBE INSERTION;  Surgeon: Jacques Sharper, MD;  Location[facility];  Service: Ophthalmology;  Laterality: Bilateral;   LACRIMAL TUBE REMOVAL Bilateral 05/03/2019   Procedure: BILATERAL NASOLACRIMAL DUCT PROBING, IIRIGATION AND TUBE REMOVAL BOTH EYES;  Surgeon: Jacques Sharper, MD;  Location:  SURGERY CENTER;  Service: Ophthalmology;  Laterality: Bilateral;   LUMBAR LAMINECTOMY/DECOMPRESSION MICRODISCECTOMY N/A 06/03/2015   Procedure: Bilateral lateral recess decompression L2-3, L3-4, L4-5;  Surgeon: Lynwood FORBES Better, MD;  Location: [facility];  Service: Orthopedics;  Laterality: N/A;   LUMBAR LAMINECTOMY/DECOMPRESSION MICRODISCECTOMY N/A 10/02/2016   Procedure: Right L5-S1 Lateral Recess Decompression  microdiscectomy;  Surgeon: Better Lynwood FORBES, MD;  Location: [facility] OR;  Service: Orthopedics;  Laterality: N/A;   LUMBAR LAMINECTOMY/DECOMPRESSION MICRODISCECTOMY N/A 07/15/2018   Procedure: LEFT L3-4 MICRODISCECTOMY;  Surgeon: Better Lynwood FORBES, MD;  Location: [facility] OR;  Service: Orthopedics;  Laterality: N/A;   svd      x 2   TEAR DUCT PROBING Bilateral 03/01/2019   Procedure: TEAR DUCT PROBING WITH IRRIGATION;   Surgeon: Jacques Sharper, MD;  Location: [facility];  Service: Ophthalmology;  Laterality: Bilateral;   TOTAL HIP ARTHROPLASTY Left 03/14/2019   Procedure: LEFT TOTAL HIP ARTHROPLASTY ANTERIOR APPROACH;  Surgeon: Vernetta Lonni GRADE, MD;  Location: [facility];  Service: Orthopedics;  Laterality: Left;   TOTAL KNEE ARTHROPLASTY Right 09/06/2015   Procedure: RIGHT TOTAL KNEE ARTHROPLASTY;  Surgeon: Lynwood FORBES Better, MD;  Location: [facility];  Service: Orthopedics;  Laterality: Right;   TOTAL KNEE REVISION Right 05/12/2022   Procedure: RIGHT TOTAL KNEE REVISION ARTHROPLASTY;  Surgeon: Vernetta Lonni GRADE, MD;  Location: [facility];  Service: Orthopedics;  Laterality: Right;   TUBAL LIGATION     UPPER GASTROINTESTINAL ENDOSCOPY  04/28/2011    ROS: Review of Systems Negative except as stated above  PHYSICAL EXAM: BP 132/75 (BP Location: Left Arm, Patient Position: Sitting, Cuff Size: Normal)   Pulse 69   Temp 98.6 F (37 C) (Oral)   Ht 5' 6 (1.676 m)   Wt 154 lb (69.9 kg)   LMP 09/01/2010   SpO2 97%   BMI 24.86 kg/m   Wt Readings from Last 3 Encounters:  09/13/23 154 lb (69.9 kg)  05/10/23 164 lb (74.4 kg)  05/05/23 156 lb (70.8 kg)    Physical Exam  General appearance - alert, well appearing, and in no distress Mental status - normal mood, behavior, speech, dress, motor activity, and thought processes Neck - no thyroid  enlargement or nodules Chest - clear to auscultation, no wheezes, rales or rhonchi, symmetric air entry Heart - normal rate, regular rhythm, normal S1, S2, no murmurs, rubs, clicks or gallops Musculoskeletal -right arm: Still persisting is what appears to be soft tissue mass where the lower edge is partially in the upper medial aspect of the antecubital fossa measures 5 x 4.5 cm.  It is soft and not tender to touch.  Noted to have a smaller 1 in similar location in the left arm. Extremities - legs warm, DP/PT/popliteal pulses 3+ BL      Latest Ref Rng &  Units 06/09/2023    8:32 AM 03/19/2023   12:30 PM 06/17/2022   10:03 AM  CMP  Glucose 70 - 99 mg/dL 878  868    BUN 8 - 27 mg/dL 11  9    Creatinine 9.42 - 1.00 mg/dL 9.00  9.11    Sodium 865 - 144 mmol/L 138  139    Potassium 3.5 - 5.2 mmol/L 4.3  3.7    Chloride 96 - 106 mmol/L 99  101    CO2 20 - 29 mmol/L 21  24    Calcium  8.7 - 10.3 mg/dL 9.9  9.9  9.6   Total Protein 6.0 - 8.5 g/dL 7.2     Total Bilirubin 0.0 - 1.2 mg/dL 0.2     Alkaline Phos 44 - 121 IU/L 136     AST 0 - 40 IU/L 14     ALT 0 - 32 IU/L 10      Lipid Panel     Component Value Date/Time   CHOL 163 06/09/2023 0832   TRIG 196 (H) 06/09/2023 0832   HDL 46 06/09/2023 0832   CHOLHDL 3.5 06/09/2023 0832   CHOLHDL 4.2 07/19/2014 1202   VLDL 24 07/19/2014 1202   LDLCALC 84 06/09/2023 0832    CBC    Component Value Date/Time   WBC 7.4 06/01/2022 1132   RBC 4.08 06/01/2022 1132   HGB 11.0 (L) 06/01/2022 1132   HGB 11.3 05/01/2021 1526   HCT 33.5 (L) 06/01/2022 1132   HCT 34.7 05/01/2021 1526   PLT 449 (H) 06/01/2022 1132   PLT 297 05/01/2021 1526   MCV 82.1 06/01/2022 1132   MCV 81 05/01/2021 1526   MCH 27.0 06/01/2022 1132   MCHC 32.8 06/01/2022 1132   RDW 14.6 06/01/2022 1132   RDW 15.7 (H) 05/01/2021 1526   LYMPHSABS 2.4 06/01/2022 1132   LYMPHSABS 4.1 (H) 10/14/2018 1704   MONOABS 0.5 06/01/2022 1132   EOSABS 0.1 06/01/2022 1132   EOSABS 0.1 10/14/2018 1704   BASOSABS 0.0 06/01/2022 1132   BASOSABS 0.0 10/14/2018 1704    ASSESSMENT AND PLAN: 1. Diabetes mellitus treated with oral medication (HCC) (Primary) Controlled.  Continue Farxiga  10 mg daily and metformin  750 mg daily.  Continue healthy eating habits and monitoring of blood sugars. - POCT glycosylated hemoglobin (Hb A1C) - DULoxetine  (CYMBALTA ) 60 MG capsule; TAKE 1 CAPSULE (60 MG TOTAL) BY MOUTH DAILY (AM)  Dispense: 90 capsule; Refill: 1  2. Hypertension associated with type 2 diabetes mellitus (HCC) At goal.  Continue Diovan /HCTZ  160/12.5 mg once a day.  3. Chronic obstructive pulmonary disease, unspecified COPD type (HCC) Stable.  Continue Spiriva  and  Symbicort  - albuterol  (PROVENTIL ) (2.5 MG/3ML) 0.083% nebulizer solution; USE ONE VIAL FOR UP TO 3 TIMES DAILY.  Dispense: 360 mL; Refill: 2 - SPIRIVA  HANDIHALER 18 MCG inhalation capsule; Place 1 capsule (18 mcg total) into inhaler and inhale daily.  Dispense: 90 capsule; Refill: 3  4. Mass of right forearm I will send her to her orthopedics to specifically look at this area to help determine whether this is a cyst/mass or prominence of the musculature.  Of note it has increased in size some compared to when I saw her 4 months ago.  We may need to try to petition her insurance again for the MRI. - AMB referral to orthopedics  5. Excessive sweating Advised to try to keep temperature in her house at a comfortable level. - TSH+T4F+T3Free  6. Bilateral leg cramps Most likely due to atorvastatin .  See #7 below.  7. PAD (peripheral artery disease) (HCC) Cramps most likely due to atorvastatin  as she has good peripheral pulses.  We will change the atorvastatin  to Crestor  instead to see if she tolerates it better. - rosuvastatin  (CRESTOR ) 20 MG tablet; Take 1 tablet (20 mg total) by mouth daily. Stop Atorvastatin   Dispense: 90 tablet; Refill: 3    Patient was given the opportunity to ask questions.  Patient verbalized understanding of the plan and was able to repeat key elements of the plan.   This documentation was completed using Paediatric nurse.  Any transcriptional errors are unintentional.  Orders Placed This Encounter  Procedures   POCT glycosylated hemoglobin (Hb A1C)     Requested Prescriptions   Pending Prescriptions Disp Refills   albuterol  (PROVENTIL ) (2.5 MG/3ML) 0.083% nebulizer solution 360 mL 0    Sig: USE ONE VIAL FOR UP TO 3 TIMES DAILY.   celecoxib  (CELEBREX ) 200 MG capsule 30 capsule 3   DULoxetine  (CYMBALTA ) 60 MG capsule  90 capsule 1    Sig: TAKE 1 CAPSULE (60 MG TOTAL) BY MOUTH DAILY (AM)   SPIRIVA  HANDIHALER 18 MCG inhalation capsule 90 capsule 0    Sig: Place 1 capsule (18 mcg total) into inhaler and inhale daily.    No follow-ups on file.  Barnie Louder, MD, FACP

## 2023-09-14 ENCOUNTER — Ambulatory Visit: Payer: Self-pay | Admitting: Internal Medicine

## 2023-09-14 ENCOUNTER — Telehealth: Payer: Self-pay

## 2023-09-14 DIAGNOSIS — E1142 Type 2 diabetes mellitus with diabetic polyneuropathy: Secondary | ICD-10-CM

## 2023-09-14 LAB — TSH+T4F+T3FREE
Free T4: 1.16 ng/dL (ref 0.82–1.77)
T3, Free: 3 pg/mL (ref 2.0–4.4)
TSH: 1.93 u[IU]/mL (ref 0.450–4.500)

## 2023-09-14 NOTE — Telephone Encounter (Signed)
 Copied from CRM [id number]. Topic: Clinical - Medication Question >> Sep 14, 2023  8:16 AM Fonda T wrote: Reason for CRM: Received call from patient, inquiring if she can get another Accucheck blood glucose monitor, as her current one she has is no longer working. Reports she has to manually use a needle to check her blood sugars.  Patient requesting a new device to be sent to pharmacy.  Blood Glucose Monitoring Suppl (ACCU-CHEK AVIVA PLUS) w/Device KIT  Pharmacy:   [facility] - [place], [place] - [address] [place] [address] Phone: [phone] Fax: [phone]  Can be reached at [phone] If need to discuss further.

## 2023-09-15 ENCOUNTER — Telehealth: Payer: Self-pay

## 2023-09-15 ENCOUNTER — Telehealth: Payer: Self-pay | Admitting: Internal Medicine

## 2023-09-15 ENCOUNTER — Other Ambulatory Visit: Payer: Self-pay | Admitting: Internal Medicine

## 2023-09-15 DIAGNOSIS — F331 Major depressive disorder, recurrent, moderate: Secondary | ICD-10-CM | POA: Diagnosis not present

## 2023-09-15 MED ORDER — ACCU-CHEK AVIVA PLUS VI STRP: ORAL_STRIP | 0 refills | Status: AC

## 2023-09-15 MED ORDER — ACCU-CHEK AVIVA PLUS W/DEVICE KIT
PACK | 0 refills | Status: AC
Start: 2023-09-15 — End: ?

## 2023-09-15 NOTE — Telephone Encounter (Unsigned)
 Copied from CRM #[id number]. Topic: Clinical - Medication Refill >> Sep 15, 2023  1:09 PM Zy'onna H wrote: Medication:  Accu-Chek Softclix Lancets lancets  Lancets (ACCU-CHEK SOFT TOUCH) lancets  Accu-Check Pen/Kit   Has the patient contacted their pharmacy? Yes (Agent: If no, request that the patient contact the pharmacy for the refill. If patient does not wish to contact the pharmacy document the reason why and proceed with request.) (Agent: If yes, when and what did the pharmacy advise?)  This is the patient's preferred pharmacy:  [facility] - [place], [place] - [address] [place] [address] Phone: [phone] Fax: [phone]  Is this the correct pharmacy for this prescription? Yes If no, delete pharmacy and type the correct one.   Has the prescription been filled recently? No  Is the patient out of the medication? Yes  Has the patient been seen for an appointment in the last year OR does the patient have an upcoming appointment? Yes  Can we respond through MyChart? No  **Ms. Pam advised she has reached out about  this prescription refill 2x already and the pharmacy has stated the order hasn't been sent over yet. Sending this in as high priority   patient can get the Lancets/Pen(Accu-check Kit) she needs.   Agent: Please be advised that Rx refills may take up to 3 business days. We ask that you follow-up with your pharmacy.

## 2023-09-15 NOTE — Telephone Encounter (Signed)
 Spoke with patient . Patient made aware that monitor was sent to [facility] pharmacy.

## 2023-09-15 NOTE — Telephone Encounter (Unsigned)
 Copied from CRM [id number]. Topic: Clinical - Medication Refill >> Sep 15, 2023  1:21 PM Zy'onna H wrote: Medication:  Accu-Chek Softclix Lancets lancets  Accu-Chek (Full Kit) Lancets (ACCU-CHEK SOFT TOUCH) lancets    Has the patient contacted their pharmacy? Yes (Agent: If no, request that the patient contact the pharmacy for the refill. If patient does not wish to contact the pharmacy document the reason why and proceed with request.) (Agent: If yes, when and what did the pharmacy advise?)  This is the patient's preferred pharmacy:  [facility] - [place], [place] - [address] [place] [address] Phone: [phone] Fax: [phone]  Is this the correct pharmacy for this prescription? Yes If no, delete pharmacy and type the correct one.   Has the prescription been filled recently? No  Is the patient out of the medication? Yes  Has the patient been seen for an appointment in the last year OR does the patient have an upcoming appointment? Yes  Can we respond through MyChart? No Stacy [name] stated she has been without the pen for a while, reached out the clinic twice for assistance, the pharmacy informed her that an order has not come in for the kit yet.  She is needing the full kit, as she can't check her glucose at the moment.   Agent: Please be advised that Rx refills may take up to 3 business days. We ask that you follow-up with your pharmacy.

## 2023-09-15 NOTE — Addendum Note (Signed)
 Addended by: VICCI SOBER B on: 09/15/2023 08:09 AM   Modules accepted: Orders

## 2023-09-22 DIAGNOSIS — F331 Major depressive disorder, recurrent, moderate: Secondary | ICD-10-CM | POA: Diagnosis not present

## 2023-09-23 ENCOUNTER — Telehealth: Payer: Self-pay

## 2023-09-23 DIAGNOSIS — I10 Essential (primary) hypertension: Secondary | ICD-10-CM

## 2023-09-23 DIAGNOSIS — J439 Emphysema, unspecified: Secondary | ICD-10-CM

## 2023-09-23 NOTE — Progress Notes (Signed)
 Complex Care Management Note  Care Guide Note 09/23/2023 Name: Stacy [name] [facility] MRN: [id number] DOB: 01-13-1961  Stacy [name] Obst is a 63 y.o. year old female who sees Vicci Barnie NOVAK, MD for primary care. I reached out to [employer] by phone today to offer complex care management services.  [name] was given information about Complex Care Management services today including:   The Complex Care Management services include support from the care team which includes your Nurse Care Manager, Clinical Social Worker, or Pharmacist.  The Complex Care Management team is here to help remove barriers to the health concerns and goals most important to you. Complex Care Management services are voluntary, and the patient may decline or stop services at any time by request to their care team member.   Complex Care Management Consent Status: Patient agreed to services and verbal consent obtained.   Follow up plan:  Telephone appointment with complex care management team member scheduled for:  10/01/2023  Encounter Outcome:  Patient Scheduled  Jeoffrey Buffalo , RMA     [place]  [facility], [facility] Guide  Direct Dial: [phone]  Website: [email or web address]

## 2023-09-27 ENCOUNTER — Ambulatory Visit
Admission: RE | Admit: 2023-09-27 | Discharge: 2023-09-27 | Disposition: A | Discharge status: Home / Self Care | Source: Ambulatory Visit

## 2023-09-27 ENCOUNTER — Other Ambulatory Visit: Payer: Self-pay

## 2023-09-27 DIAGNOSIS — R112 Nausea with vomiting, unspecified: Secondary | ICD-10-CM | POA: Diagnosis not present

## 2023-09-27 DIAGNOSIS — R109 Unspecified abdominal pain: Secondary | ICD-10-CM | POA: Diagnosis not present

## 2023-09-27 DIAGNOSIS — K589 Irritable bowel syndrome without diarrhea: Secondary | ICD-10-CM | POA: Diagnosis not present

## 2023-09-27 DIAGNOSIS — K760 Fatty (change of) liver, not elsewhere classified: Secondary | ICD-10-CM | POA: Diagnosis not present

## 2023-09-27 MED ORDER — IOPAMIDOL (ISOVUE-300) INJECTION 61%
100.0000 mL | Freq: Once | INTRAVENOUS | Status: AC | PRN
  Administered 2023-09-27: 100 mL via INTRAVENOUS

## 2023-09-29 DIAGNOSIS — F331 Major depressive disorder, recurrent, moderate: Secondary | ICD-10-CM | POA: Diagnosis not present

## 2023-10-01 ENCOUNTER — Other Ambulatory Visit: Payer: Self-pay | Admitting: *Deleted

## 2023-10-01 ENCOUNTER — Other Ambulatory Visit: Payer: Self-pay

## 2023-10-01 NOTE — Patient Instructions (Signed)
 Visit Information  [name] was given information about Medicaid Managed Care team care coordination services as a part of their [facility] Community Plan Medicaid benefit.   If you would like to schedule transportation through your [facility], please call the following number at least 2 days in advance of your appointment: [phone]   Rides for urgent appointments can also be made after hours by calling Member Services.  Call the Behavioral Health Crisis Line at [phone], at any time, 24 hours a day, 7 days a week. If you are in danger or need immediate medical attention call 911.   [name] - following are the goals we discussed in your visit today:   Goals Addressed             This Visit's Progress    VBCI RN Care Plan-DM       Problems:  Chronic Disease Management support and education needs related to DMII  Goal: Over the next 90 days the Patient will attend all scheduled medical appointments: PCP- 01/13/24 and Specialist- Ortho-10/19/23, Podiatry-11/30/23 and BHR- 10/06/23 as evidenced by keeping all schedule appointments.          continue to work with Medical illustrator and/or Social Worker to address care management and care coordination needs related to DMII as evidenced by adherence to care management team scheduled appointments     take all medications exactly as prescribed and will call provider for medication related questions as evidenced by compliance.    verbalize basic understanding of DMII disease process and self health management plan as evidenced by verbal explanation, recognize monitor symptoms, and life style changes.    Interventions:   Diabetes Interventions: Assessed patient's understanding of A1c goal: <7% Provided education to patient about basic DM disease process Reviewed medications with patient and discussed importance of medication adherence Counseled on importance of regular laboratory monitoring as  prescribed Discussed plans with patient for ongoing care management follow up and provided patient with direct contact information for care management team Provided patient with written educational materials related to hypo and hyperglycemia and importance of correct treatment Screening for signs and symptoms of depression related to chronic disease state  Assessed social determinant of health barriers Lab Results  Component Value Date   HGBA1C 7.0 09/13/2023    Patient Self-Care Activities:  Attend all scheduled provider appointments Attend church or other social activities Call pharmacy for medication refills 3-7 days in advance of running out of medications Take medications as prescribed   keep appointment with eye doctor check blood sugar at prescribed times: before meals and at bedtime check feet daily for cuts, sores or redness enter blood sugar readings and medication or insulin  into daily log drink 6 to 8 glasses of water each day limit fast food meals to no more than 1 per week  Plan:  Telephone follow up appointment with care management team member scheduled for:  10/18/23 @ 9am          VBCI RN Care Plan-HTN       Problems:  Chronic Disease Management support and education needs related to HTN  Goal: Over the next 90 days the Patient will attend all scheduled medical appointments: with PCP-01/13/24 and Specialist- Ortho-10/19/23, Podiatry- 11/30/23, and BHR -10/06/23 as evidenced by keeping all scheduled appointments.         continue to work with Medical illustrator and/or Social Worker to address care management and care coordination needs related to HTN as  evidenced by adherence to care management team scheduled appointments     take all medications exactly as prescribed and will call provider for medication related questions as evidenced by compliance    verbalize basic understanding of HTN disease process and self health management plan as evidenced by verbal explanation,  recognize, monitor symptom and life style changes.   Interventions:   Hypertension Interventions: Last practice recorded BP readings:  BP Readings from Last 3 Encounters:  09/13/23 133/80  05/10/23 116/71  03/26/23 123/66   Most recent eGFR/CrCl:  Lab Results  Component Value Date   EGFR 64 06/09/2023    No components found for: CRCL  Evaluation of current treatment plan related to hypertension self management and patient's adherence to plan as established by provider Provided education to patient re: stroke prevention, s/s of heart attack and stroke Reviewed medications with patient and discussed importance of compliance Discussed plans with patient for ongoing care management follow up and provided patient with direct contact information for care management team Advised patient, providing education and rationale, to monitor blood pressure daily and record, calling PCP for findings outside established parameters Provided education on prescribed diet Dash Discussed complications of poorly controlled blood pressure such as heart disease, stroke, circulatory complications, vision complications, kidney impairment, sexual dysfunction Screening for signs and symptoms of depression related to chronic disease state  Assessed social determinant of health barriers  Patient Self-Care Activities:  Attend all scheduled provider appointments Attend church or other social activities Call pharmacy for medication refills 3-7 days in advance of running out of medications Take medications as prescribed   check blood pressure daily write blood pressure results in a log or diary learn about high blood pressure keep a blood pressure log call doctor for signs and symptoms of high blood pressure keep all doctor appointments take medications for blood pressure exactly as prescribed report new symptoms to your doctor eat more whole grains, fruits and vegetables, lean meats and healthy fats  Plan:   Telephone follow up appointment with care management team member scheduled for:  10/18/23 @ 9 am             Please see education materials related to Diabetes and HTN provided as print materials.   The patient verbalized understanding of instructions, educational materials, and care plan provided today and agreed to receive a mailed copy of patient instructions, educational materials, and care plan.   Telephone follow up appointment with Managed Medicaid care management team member scheduled for: 10/18/23 @ 9 am  Stacy Hole, RN, BSN, [place] RN Care Manager [facility] [phone]   Following is a copy of your plan of care:  There are no care plans that you recently modified to display for this patient.  Visit Information  Ms. Eagleson was given information about Medicaid Managed Care team care coordination services as a part of their [facility] Community Plan Medicaid benefit.   If you would like to schedule transportation through your [facility], please call the following number at least 2 days in advance of your appointment: [phone]   Rides for urgent appointments can also be made after hours by calling Member Services.  Call the Behavioral Health Crisis Line at [phone], at any time, 24 hours a day, 7 days a week. If you are in danger or need immediate medical attention call 911.   [name] - following are the goals we discussed in your visit today:   Goals Addressed  This Visit's Progress    VBCI RN Care Plan-DM       Problems:  Chronic Disease Management support and education needs related to DMII  Goal: Over the next 90 days the Patient will attend all scheduled medical appointments: PCP- 01/13/24 and Specialist- Ortho-10/19/23, Podiatry-11/30/23 and BHR- 10/06/23 as evidenced by keeping all schedule appointments.          continue to work with Medical illustrator and/or Social Worker to address care  management and care coordination needs related to DMII as evidenced by adherence to care management team scheduled appointments     take all medications exactly as prescribed and will call provider for medication related questions as evidenced by compliance.    verbalize basic understanding of DMII disease process and self health management plan as evidenced by verbal explanation, recognize monitor symptoms, and life style changes.    Interventions:   Diabetes Interventions: Assessed patient's understanding of A1c goal: <7% Provided education to patient about basic DM disease process Reviewed medications with patient and discussed importance of medication adherence Counseled on importance of regular laboratory monitoring as prescribed Discussed plans with patient for ongoing care management follow up and provided patient with direct contact information for care management team Provided patient with written educational materials related to hypo and hyperglycemia and importance of correct treatment Screening for signs and symptoms of depression related to chronic disease state  Assessed social determinant of health barriers Lab Results  Component Value Date   HGBA1C 7.0 09/13/2023    Patient Self-Care Activities:  Attend all scheduled provider appointments Attend church or other social activities Call pharmacy for medication refills 3-7 days in advance of running out of medications Take medications as prescribed   keep appointment with eye doctor check blood sugar at prescribed times: before meals and at bedtime check feet daily for cuts, sores or redness enter blood sugar readings and medication or insulin  into daily log drink 6 to 8 glasses of water each day limit fast food meals to no more than 1 per week  Plan:  Telephone follow up appointment with care management team member scheduled for:  10/18/23 @ 9am          VBCI RN Care Plan-HTN       Problems:  Chronic Disease  Management support and education needs related to HTN  Goal: Over the next 90 days the Patient will attend all scheduled medical appointments: with PCP-01/13/24 and Specialist- Ortho-10/19/23, Podiatry- 11/30/23, and BHR -10/06/23 as evidenced by keeping all scheduled appointments.         continue to work with Medical illustrator and/or Social Worker to address care management and care coordination needs related to HTN as evidenced by adherence to care management team scheduled appointments     take all medications exactly as prescribed and will call provider for medication related questions as evidenced by compliance    verbalize basic understanding of HTN disease process and self health management plan as evidenced by verbal explanation, recognize, monitor symptom and life style changes.   Interventions:   Hypertension Interventions: Last practice recorded BP readings:  BP Readings from Last 3 Encounters:  09/13/23 133/80  05/10/23 116/71  03/26/23 123/66   Most recent eGFR/CrCl:  Lab Results  Component Value Date   EGFR 64 06/09/2023    No components found for: CRCL  Evaluation of current treatment plan related to hypertension self management and patient's adherence to plan as established by provider Provided education to patient re:  stroke prevention, s/s of heart attack and stroke Reviewed medications with patient and discussed importance of compliance Discussed plans with patient for ongoing care management follow up and provided patient with direct contact information for care management team Advised patient, providing education and rationale, to monitor blood pressure daily and record, calling PCP for findings outside established parameters Provided education on prescribed diet Dash Discussed complications of poorly controlled blood pressure such as heart disease, stroke, circulatory complications, vision complications, kidney impairment, sexual dysfunction Screening for signs and  symptoms of depression related to chronic disease state  Assessed social determinant of health barriers  Patient Self-Care Activities:  Attend all scheduled provider appointments Attend church or other social activities Call pharmacy for medication refills 3-7 days in advance of running out of medications Take medications as prescribed   check blood pressure daily write blood pressure results in a log or diary learn about high blood pressure keep a blood pressure log call doctor for signs and symptoms of high blood pressure keep all doctor appointments take medications for blood pressure exactly as prescribed report new symptoms to your doctor eat more whole grains, fruits and vegetables, lean meats and healthy fats  Plan:  Telephone follow up appointment with care management team member scheduled for:  10/18/23 @ 9 am             Please see education materials related to HTN and Diabetes provided as print materials.   The patient verbalized understanding of instructions, educational materials, and care plan provided today and agreed to receive a mailed copy of patient instructions, educational materials, and care plan.   Telephone follow up appointment with Managed Medicaid care management team member scheduled for: 10/18/23 @ 9 am  Stacy [name], Charity fundraiser, Scientist, research (physical sciences), Theatre manager [employer] [phone]

## 2023-10-01 NOTE — Patient Outreach (Signed)
 Complex Care Management   Visit Note  10/01/2023  Name:  [name] MRN: [id number] DOB: 1960-05-25  Situation: Referral received for Complex Care Management related to HTN and DM I obtained verbal consent from Patient.  Visit completed with Patient  on the phone  Background:   Past Medical History:  Diagnosis Date   Allergy     Shellfish, cleaning products   Anxiety    Arthritis    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   Asthma    COPD (chronic obstructive pulmonary disease) (HCC)    Depression    Diabetes mellitus    Type II   GERD (gastroesophageal reflux disease)    Pt on Protonix  daily   Glaucoma    Gout    Headache(784.0)    otc meds prn   Hyperlipidemia    Hypertension    Irritable bowel syndrome 11/19/2010   Neuropathy    Pneumonia YRS AGO   Restless legs    Shortness of breath    07/14/2018- uses  4 times a day   Sleep apnea     Assessment: Patient Reported Symptoms:  Cognitive Cognitive Status: Insightful and able to interpret abstract concepts, Normal speech and language skills Cognitive/Intellectual Conditions Management [RPT]: None reported or documented in medical history or problem list   Health Maintenance Behaviors: Annual physical exam, Sleep adequate, Spiritual practice(s), Stress management, Healthy diet, Social activities Healing Pattern: Slow Health Facilitated by: Healthy diet, Pain control, Prayer/meditation, Rest, Stress management  Neurological Neurological Review of Symptoms: No symptoms reported Neurological Management Strategies: Adequate rest, Routine screening Neurological Self-Management Outcome: 4 (good)  HEENT HEENT Symptoms Reported: No symptoms reported HEENT Management Strategies: Routine screening HEENT Self-Management Outcome: 4 (good)    Cardiovascular Cardiovascular Symptoms Reported: No symptoms reported Does patient have uncontrolled Hypertension?: Yes Is patient checking Blood Pressure at  home?: Yes Patient's Recent BP reading at home: 114/80- Yesterday Cardiovascular Management Strategies: Routine screening, Medication therapy, Adequate rest, Medical device Weight: 153 lb (69.4 kg) (patient reports weight was 153 on 09/27/23.  She reports that having a doctors appt on this day.) Cardiovascular Self-Management Outcome: 3 (uncertain)  Respiratory Respiratory Symptoms Reported: No symptoms reported Respiratory Management Strategies: Routine screening, Medication therapy, CPAP Respiratory Self-Management Outcome: 4 (good)  Endocrine Endocrine Symptoms Reported: Unintentional weight loss, Nausea or vomiting (Patient reports that she has had nausea for 14 days.  Has since seen GI and reports nausea is getting better.) Is patient diabetic?: Yes Is patient checking blood sugars at home?: Yes List most recent blood sugar readings, include date and time of day: BS- 124 today Endocrine Comment: Highest-138 lowest-109  Gastrointestinal Gastrointestinal Symptoms Reported: Diarrhea, Vomiting, Nausea Additional Gastrointestinal Details: Patient reports that she has IBS. Gastrointestinal Management Strategies: Medication therapy, Adequate rest, Diet modification Gastrointestinal Self-Management Outcome: 3 (uncertain)    Genitourinary Genitourinary Symptoms Reported: Incontinence Additional Genitourinary Details: Patient reports that she wears briefs Genitourinary Management Strategies: Adequate rest, Medication therapy, Medical device, Incontinence garment/pad Genitourinary Self-Management Outcome: 3 (uncertain)  Integumentary Integumentary Symptoms Reported: Sweating Other Integumentary Symptoms: Patient reports that she has excessive sweating. Skin Management Strategies: Routine screening, Adequate rest Skin Self-Management Outcome: 4 (good)  Musculoskeletal Musculoskelatal Symptoms Reviewed: Back pain, Joint pain, Limited mobility, Weakness Additional Musculoskeletal Details: Patient  reports that has legs buckle and she also has spasms in her legs and back.  Patient reports that she will call orthopedics to ask for a muscel relaxer.  She reports that she has Carpal tunnel  sydrome.  She reports numbness in her hands bilaterally Musculoskeletal Management Strategies: Routine screening, Medication therapy Falls in the past year?: Yes Number of falls in past year: 2 or more Was there an injury with Fall?: No Fall Risk Category Calculator: 2 Patient Fall Risk Level: Moderate Fall Risk Patient at Risk for Falls Due to: History of fall(s), Impaired balance/gait, Impaired mobility, Orthopedic patient (Patient reports that her left legs buckles.  Patient reports taht she had two knee replacement.  Last surgery was a year ago.) Fall risk Follow up: Falls evaluation completed, Education provided, Falls prevention discussed  Psychosocial Psychosocial Symptoms Reported: Anxiety - if selected complete GAD Additional Psychological Details: Reports that she takes hydroxyine for anxiety and also receives couseling Behavioral Management Strategies: Coping strategies, Support system, Counseling, Medication therapy Behavioral Health Self-Management Outcome: 4 (good) Major Change/Loss/Stressor/Fears (CP): Denies Techniques to Cope with Loss/Stress/Change: Counseling, Medication, Spiritual practice(s) Quality of Family Relationships: helpful, involved, supportive Do you feel physically threatened by others?: No    10/01/2023    PHQ2-9 Depression Screening   Little interest or pleasure in doing things Several days (Has had an acute issue with nausea.)  Feeling down, depressed, or hopeless Not at all  PHQ-2 - Total Score 1  Trouble falling or staying asleep, or sleeping too much    Feeling tired or having little energy    Poor appetite or overeating     Feeling bad about yourself - or that you are a failure or have let yourself or your family down    Trouble concentrating on things, such as  reading the newspaper or watching television    Moving or speaking so slowly that other people could have noticed.  Or the opposite - being so fidgety or restless that you have been moving around a lot more than usual    Thoughts that you would be better off dead, or hurting yourself in some way    PHQ2-9 Total Score    If you checked off any problems, how difficult have these problems made it for you to do your work, take care of things at home, or get along with other people    Depression Interventions/Treatment      There were no vitals filed for this visit.  Medications Reviewed Today     Reviewed by Jorja Nichole LABOR, RN (Case Manager) on 10/01/23 at [phone]  Med List Status: <None>   Medication Order Taking? Sig Documenting Provider Last Dose Status Informant  Accu-Chek Softclix Lancets lancets [id number] Yes USE AS INSTRUCTED 3 TIMES A DAY Vicci Barnie NOVAK, MD  Active   albuterol  (PROVENTIL ) (2.5 MG/3ML) 0.083% nebulizer solution [id number] Yes USE ONE VIAL FOR UP TO 3 TIMES DAILY. Vicci Barnie NOVAK, MD  Active   allopurinol  (ZYLOPRIM ) 100 MG tablet [id number] Yes TAKE 1 TABLET (100 MG TOTAL) BY MOUTH DAILY.(AM) Vicci Barnie NOVAK, MD  Active   Blood Glucose Monitoring Suppl (ACCU-CHEK AVIVA PLUS) w/Device KIT [id number] Yes UAD to check blood sugars. Vicci Barnie NOVAK, MD  Active   celecoxib  (CELEBREX ) 200 MG capsule [id number] Yes Take 1 capsule (200 mg total) by mouth daily. Vicci Barnie NOVAK, MD  Active   clopidogrel  (PLAVIX ) 75 MG tablet [id number] Yes TAKE 1 TABLET (75 MG TOTAL) BY MOUTH DAILY. (AM) Vicci Barnie NOVAK, MD  Active   diclofenac  Sodium (VOLTAREN ) 1 % GEL [id number] Yes Apply 2 g topically 4 (four) times daily. Gretta Bertrum ORN, PA-C  Active   DULoxetine  (CYMBALTA ) 60 MG capsule [id number]  Yes TAKE 1 CAPSULE (60 MG TOTAL) BY MOUTH DAILY (AM) Vicci Barnie NOVAK, MD  Active   EPINEPHrine  0.3 mg/0.3 mL IJ SOAJ injection [id number] Yes 0.3 mg IM x 1 PRN for allergic reaction  Vicci Barnie NOVAK, MD  Active   FARXIGA  10 MG TABS tablet [id number] Yes TAKE 1 TABLET (10 MG TOTAL) BY MOUTH DAILY BEFORE BREAKFAST. Vicci Barnie NOVAK, MD  Active   [name] 325 (65 Fe) MG tablet [id number] Yes TAKE ONE TABLET ( 325 MG ) BY MOUTH DAILY (AM) Vicci Barnie NOVAK, MD  Active   fluticasone  (FLONASE ) 50 MCG/ACT nasal spray [id number] Yes PLACE 1 SPRAY INTO BOTH NOSTRILS DAILY. Vicci Barnie NOVAK, MD  Active     Discontinued 04/16/11 0943 Glucagon  1 MG/0.2ML SOSY [id number] Yes 1 mg arrange subcutaneously as needed for severe symptomatic low blood sugar Vicci Barnie NOVAK, MD  Active Self, Pharmacy Records  glucose blood (ACCU-CHEK AVIVA PLUS) test strip [id number] Yes USE AS INSTRUCTED 3 TIMES A DAY. E11.9, Z79.84 Vicci Barnie NOVAK, MD  Active   glucose chewable tablet 12 g [id number]   Vicci Barnie NOVAK, MD  Active   glycopyrrolate  (ROBINUL ) 2 MG tablet [id number]  Take 2 mg by mouth 3 (three) times daily.  Patient not taking: Reported on 10/01/2023   [provider]  Active Self, Pharmacy Records  hydrOXYzine  (ATARAX ) 10 MG tablet [id number] Yes TAKE 1 TABLET IN THE MORNING, THEN 1 TABLET AT NOON AND 2 TABLETS IN THE EVENING AS NEEDED. Vicci Barnie NOVAK, MD  Active   Lancets (ACCU-CHEK SOFT [place]) lancets [id number] Yes Use as instructed Danton Jon HERO, PA-C  Active Self, Pharmacy Records   Patient not taking:   Discontinued 04/16/11 0916 (Error) loratadine  (CLARITIN ) 10 MG tablet [id number] Yes TAKE 1 TABLET (10 MG TOTAL) BY MOUTH DAILY AS NEEDED FOR ALLERGIES. (AM) Vicci Barnie NOVAK, MD  Active   melatonin 5 MG TABS [id number] Yes Take 5 mg by mouth at bedtime. [provider]  Active Self, Pharmacy Records  metFORMIN  (GLUCOPHAGE -XR) 750 MG 24 hr tablet [id number] Yes TAKE 1 TABLET (750 MG TOTAL) BY MOUTH DAILY WITH BREAKFAST. Vicci Barnie NOVAK, MD  Active   methocarbamol  (ROBAXIN ) 500 MG tablet [id number] Yes TAKE 1 TABLET (500 MG TOTAL) BY MOUTH EVERY 6 (SIX) HOURS AS  NEEDED FOR MUSCLE SPASMS. Gretta Bertrum ORN, PA-C  Active   Methylnaltrexone  Bromide 8 MG/0.4ML SOLN [id number] Yes Take 0.4 mLs by mouth as needed. Take once daily as needed Rogelia Jerilynn RAMAN, MD  Active   montelukast  (SINGULAIR ) 10 MG tablet [id number] Yes Take 10 mg by mouth daily. [provider]  Active Self, Pharmacy Records  Multiple Vitamins-Minerals (CENTRUM SILVER 50+WOMEN PO) [id number] Yes Take 1 tablet by mouth daily. [provider]  Active Self, Pharmacy Records  pantoprazole  (PROTONIX ) 40 MG tablet [id number] Yes Take 40 mg by mouth daily. [provider]  Active Self, Pharmacy Records  potassium chloride  SA (KLOR-CON  M) 20 MEQ tablet [id number] Yes TAKE 1 TABLET (20 MEQ TOTAL) BY MOUTH DAILY.(NOON ) Vicci Barnie NOVAK, MD  Active   Respiratory Therapy Supplies (FLUTTER) DEVI [id number] Yes Use after breathing treatment 4 times daily Brien Belvie BRAVO, MD  Active Self, Pharmacy Records  rosuvastatin  (CRESTOR ) 20 MG tablet [id number] Yes Take 1 tablet (20 mg total) by mouth daily. Stop Atorvastatin  Vicci Barnie NOVAK, MD  Active   SPIRIVA  HANDIHALER 18 MCG inhalation capsule [id number] Yes Place 1 capsule (18 mcg total) into inhaler and inhale daily. Vicci Barnie  B, MD  Active   SYMBICORT  160-4.5 MCG/ACT inhaler [id number] Yes Inhale 1 puff into the lungs 2 (two) times daily. [provider]  Active Self, Pharmacy Records  triamcinolone  cream (KENALOG ) 0.1 % [id number] Yes Apply 1 Application topically 2 (two) times daily as needed (irritation). Danton Jon HERO, PA-C  Active   valsartan -hydrochlorothiazide  (DIOVAN -HCT) 160-12.5 MG tablet [id number] Yes TAKE ONE TABLET BY MOUTH ONCE DAILY (AM) Vicci Barnie NOVAK, MD  Active             Recommendation:   PCP Follow-up Specialty provider follow-up Ortho-10/19/23; Podiatry- 11/30/23; PCP-01/13/24 Continue Current Plan of Care  Follow Up Plan:   Telephone follow-up 2 weeks  Aviyah Swetz, RN, BSN,  ACM RN Care Manager [employer] [phone]

## 2023-10-04 DIAGNOSIS — R32 Unspecified urinary incontinence: Secondary | ICD-10-CM | POA: Diagnosis not present

## 2023-10-04 DIAGNOSIS — R159 Full incontinence of feces: Secondary | ICD-10-CM | POA: Diagnosis not present

## 2023-10-04 DIAGNOSIS — I10 Essential (primary) hypertension: Secondary | ICD-10-CM | POA: Diagnosis not present

## 2023-10-06 DIAGNOSIS — F331 Major depressive disorder, recurrent, moderate: Secondary | ICD-10-CM | POA: Diagnosis not present

## 2023-10-13 DIAGNOSIS — F331 Major depressive disorder, recurrent, moderate: Secondary | ICD-10-CM | POA: Diagnosis not present

## 2023-10-18 ENCOUNTER — Other Ambulatory Visit: Payer: Self-pay | Admitting: Internal Medicine

## 2023-10-18 ENCOUNTER — Other Ambulatory Visit: Payer: Self-pay

## 2023-10-18 ENCOUNTER — Other Ambulatory Visit: Payer: Self-pay | Admitting: *Deleted

## 2023-10-18 ENCOUNTER — Telehealth: Payer: Self-pay

## 2023-10-18 DIAGNOSIS — E11649 Type 2 diabetes mellitus with hypoglycemia without coma: Secondary | ICD-10-CM

## 2023-10-18 NOTE — Telephone Encounter (Signed)
 Copied from CRM #[id number]. Topic: Clinical - Medication Question >> Oct 18, 2023  9:41 AM Willma SAUNDERS wrote: Reason for CRM: Joen from [employer] calling in regards to a cease form that was faxed on 08/29 and again on 09/10 to Dr Vicci for the patient to stop taking clopidogrel  (PLAVIX ) 75 MG tablet for 5 days. Patient is scheduled for an endoscopy on 09/25. Joen is looking for an update on this request.  Joen can be reached at [phone] ext: [phone]   Resolved: I called [facility] and requested to speak with Ms.Sherry. I was informed that she was with patients,  I left a message stating that we have not received paperwork and wanted to make sure she had the right fax number( I checked with Clarisa, CMA and front desk before calling). All information was given and I was told they will send her the message to fax it again

## 2023-10-18 NOTE — Patient Instructions (Signed)
 Visit Information  Ms. Camargo was given information about Medicaid Managed Care team care coordination services as a part of their [facility] Plan Medicaid benefit.   If you would like to schedule transportation through your [facility], please call the following number at least 2 days in advance of your appointment: [phone]   Rides for urgent appointments can also be made after hours by calling Member Services.  Call the Behavioral Health Crisis Line at [phone], at any time, 24 hours a day, 7 days a week. If you are in danger or need immediate medical attention call 911.   Ms. Darnell - following are the goals we discussed in your visit today:   Goals Addressed             This Visit's Progress    VBCI RN Care Plan-DM       Problems:  Chronic Disease Management support and education needs related to DMII  Goal: Over the next 90 days the Patient will attend all scheduled medical appointments: PCP- 01/13/24 and Specialist- Ortho-10/19/23, Podiatry-11/30/23 and [facility]  as evidenced by keeping all schedule appointments.          continue to work with Medical illustrator and/or Social Worker to address care management and care coordination needs related to DMII as evidenced by adherence to care management team scheduled appointments     take all medications exactly as prescribed and will call provider for medication related questions as evidenced by compliance.    verbalize basic understanding of DMII disease process and self health management plan as evidenced by verbal explanation, recognize monitor symptoms, and life style changes.    Interventions:   Diabetes Interventions: Assessed patient's understanding of A1c goal: <7% Provided education to patient about basic DM disease process Reviewed medications with patient and discussed importance of medication adherence Counseled on importance of regular laboratory monitoring as  prescribed Discussed plans with patient for ongoing care management follow up and provided patient with direct contact information for care management team Provided patient with written educational materials related to hypo and hyperglycemia and importance of correct treatment Screening for signs and symptoms of depression related to chronic disease state  Assessed social determinant of health barriers Lab Results  Component Value Date   HGBA1C 7.0 09/13/2023    Patient Self-Care Activities:  Attend all scheduled provider appointments Attend church or other social activities Call pharmacy for medication refills 3-7 days in advance of running out of medications Call provider office for new concerns or questions  Take medications as prescribed   keep appointment with eye doctor check blood sugar at prescribed times: before meals and at bedtime and when you have symptoms of low or high blood sugar check feet daily for cuts, sores or redness enter blood sugar readings and medication or insulin  into daily log take the blood sugar log to all doctor visits drink 6 to 8 glasses of water each day limit fast food meals to no more than 1 per week wash and dry feet carefully every day  Plan:  Telephone follow up appointment with care management team member scheduled for:  11/17/23 @ 10 am          VBCI RN Care Plan-HTN       Problems:  Chronic Disease Management support and education needs related to HTN  Goal: Over the next 90 days the Patient will attend all scheduled medical appointments: with PCP-01/13/24 and Specialist- Ortho-10/19/23, Podiatry- 11/30/23, and [facility]  as evidenced by keeping all scheduled appointments.         continue to work with Medical illustrator and/or Social Worker to address care management and care coordination needs related to HTN as evidenced by adherence to care management team scheduled appointments     take all medications exactly as prescribed and will call  provider for medication related questions as evidenced by compliance    verbalize basic understanding of HTN disease process and self health management plan as evidenced by verbal explanation, recognize, monitor symptom and life style changes.   Interventions:   Hypertension Interventions: Last practice recorded BP readings:  BP Readings from Last 3 Encounters:  10/18/23 100/72  09/13/23 133/80  05/10/23 116/71   Most recent eGFR/CrCl:  Lab Results  Component Value Date   EGFR 64 06/09/2023    No components found for: CRCL  Evaluation of current treatment plan related to hypertension self management and patient's adherence to plan as established by provider Provided education to patient re: stroke prevention, s/s of heart attack and stroke Reviewed medications with patient and discussed importance of compliance Discussed plans with patient for ongoing care management follow up and provided patient with direct contact information for care management team Advised patient, providing education and rationale, to monitor blood pressure daily and record, calling PCP for findings outside established parameters Discussed complications of poorly controlled blood pressure such as heart disease, stroke, circulatory complications, vision complications, kidney impairment, sexual dysfunction Screening for signs and symptoms of depression related to chronic disease state  Assessed social determinant of health barriers  Patient Self-Care Activities:  Attend all scheduled provider appointments Attend church or other social activities Call pharmacy for medication refills 3-7 days in advance of running out of medications Call provider office for new concerns or questions  Take medications as prescribed   check blood pressure daily write blood pressure results in a log or diary learn about high blood pressure keep a blood pressure log take blood pressure log to all doctor appointments call doctor  for signs and symptoms of high blood pressure keep all doctor appointments take medications for blood pressure exactly as prescribed report new symptoms to your doctor eat more whole grains, fruits and vegetables, lean meats and healthy fats  Plan:  Telephone follow up appointment with care management team member scheduled for:  11/17/23 @ 10 am             Please see education materials related to Diabetes and HTN provided by MyChart link.  Patient verbalizes understanding of instructions and care plan provided today and agrees to view in MyChart. Active MyChart status and patient understanding of how to access instructions and care plan via MyChart confirmed with patient.     Telephone follow up appointment with Managed Medicaid care management team member scheduled for: 11/17/23 @ 10 am  Jatavius Ellenwood, RN, BSN, [place] RN Care Manager [facility] [phone]   Following is a copy of your plan of care:  There are no care plans that you recently modified to display for this patient.

## 2023-10-18 NOTE — Telephone Encounter (Signed)
 Form received and successfully placed in the provider box. Awaiting signature.

## 2023-10-18 NOTE — Telephone Encounter (Unsigned)
 Copied from CRM #[id number]. Topic: Clinical - Medication Refill >> Oct 18, 2023 10:31 AM Dedra B wrote: Medication: Glucagon  1 MG/0.2ML SOSY  Has the patient contacted their pharmacy? Yes, out of refills   This is the patient's preferred pharmacy:  [facility] - [place], [place] - [address] [place] [address] Phone: [phone] Fax: [phone]  Is this the correct pharmacy for this prescription? Yes   Has the prescription been filled recently? No  Is the patient out of the medication? Yes  Has the patient been seen for an appointment in the last year OR does the patient have an upcoming appointment? Yes  Can we respond through MyChart? Yes  Agent: Please be advised that Rx refills may take up to 3 business days. We ask that you follow-up with your pharmacy.

## 2023-10-19 ENCOUNTER — Ambulatory Visit: Admitting: Orthopedic Surgery

## 2023-10-19 DIAGNOSIS — G5602 Carpal tunnel syndrome, left upper limb: Secondary | ICD-10-CM | POA: Diagnosis not present

## 2023-10-19 NOTE — Progress Notes (Signed)
   Aubrea Meixner Muffley - 63 y.o. female MRN [id number]  Date of birth: [birth year]  Office Visit Note: Visit Date: 10/19/2023 PCP: Vicci Barnie NOVAK, MD Referred by: Vicci Barnie NOVAK, MD  Subjective:  HPI: Chastity Noland Magley is a 63 y.o. female who returns today for follow up for ongoing left-sided carpal tunnel syndrome status post recent injection approximately 6 weeks prior.  Of note, she is also 7 months status post right thumb carpometacarpel arthroplasty, right open carpal tunnel release.  She is doing very well on the right side.    She continues to describe numbness and tingling of the left hand radial sided digits.  States that the injection did give her relief of symptoms for a few weeks.  Describes ongoing nocturnal symptoms despite bracing and activity modification as well.  Pertinent ROS were reviewed with the patient and found to be negative unless otherwise specified above in HPI.   Assessment & Plan: Visit Diagnoses:  1. Carpal tunnel syndrome, left upper limb      Plan: Based on her clinical examination today, she is continues to demonstrate signs and symptoms consistent with left-sided carpal tunnel syndrome.  We have trialed extensive conservative measures at this time with bracing, activity modification and prior injection.  Given that she did get relief from the recent injection, despite it being temporary, this is a positive indicator for potential carpal tunnel release in the future.  She is demonstrating signs and symptoms consistent with CTS 6 criteria for the left sided carpal tunnel syndrome.  At this juncture, we can move forward with surgical scheduling of left open carpal tunnel release.  Risks and benefits of the procedure were discussed, risks including but not limited to infection, bleeding, scarring, stiffness, nerve injury, tendon injury, vascular injury, recurrence of symptoms and need for subsequent operation.  We also discussed the appropriate  postoperative protocol and timeframe for return to activities and function.  Patient expressed understanding.    As far as the right side status post right thumb CMC arthroplasty and carpal tunnel release, she is doing very well.  Thumb stability is excellent on examination today, thumb opposition to small finger [facility] and she has resumed all activities without restriction.  Numbness and tingling has resolved in the right hand as well, she is very pleased with her outcome.  Follow-up: No follow-ups on file.   Meds & Orders: No orders of the defined types were placed in this encounter.  No orders of the defined types were placed in this encounter.    Procedures: No procedures performed       Objective:   Vital Signs: LMP 09/01/2010   Ortho Exam Right wrist: - Well-healed incision at the glabrous/nonglabrous juncture over the [facility] region of the thumb - Thumb opposition to the small finger DPC - Thumb circumduction without significant pain or crepitus - Hand is warm well-perfused, sensation intact in all distributions including DRSN - Thumb pinch strength right 10, left 12  Left wrist: - Positive Tinel's carpal tunnel, positive Phalen's, positive Durkan's compression - Sensation diminished in the median nerve distribution to light touch, 2-point discrimination 9 mm - APB 5/5 without significant thenar atrophy, thumb opposition is well-preserved small finger DPC   Imaging: No results found.   Novella Abraha Afton Alderton, M.D. [facility], Hand Surgery

## 2023-10-19 NOTE — Telephone Encounter (Signed)
 Duplicate request, LRF 10/18/23.   Requested Prescriptions  Pending Prescriptions Disp Refills   Glucagon  1 MG/0.2ML SOSY 0.2 mL 0    Sig: 1 mg arrange subcutaneously as needed for severe symptomatic low blood sugar     Off-Protocol Failed - 10/19/2023  3:55 PM      Failed - Medication not assigned to a protocol, review manually.      Passed - Valid encounter within last 12 months    Recent Outpatient Visits           1 month ago Diabetes mellitus treated with oral medication (HCC)   [facility] [place]. [facility] Vicci Sober B, MD   5 months ago Type 2 diabetes mellitus with diabetic polyneuropathy, without long-term current use of insulin  [facility])   [place] [facility] Of [place]. [facility] Vicci Sober B, MD   9 months ago Controlled type 2 diabetes mellitus with diabetic polyneuropathy, without long-term current use of insulin  [facility])   [place] [facility] Of [place]. [facility] Vicci Sober B, MD   1 year ago Controlled type 2 diabetes mellitus with diabetic polyneuropathy, without long-term current use of insulin  [facility])   [place] [facility] Dept Of [place]. [facility] Vicci Sober B, MD   1 year ago Controlled type 2 diabetes mellitus with diabetic polyneuropathy, without long-term current use of insulin  [facility])   [place] [facility] Dept Of [place]. [facility] [place], [place], PA-C

## 2023-10-20 NOTE — Telephone Encounter (Signed)
 Form completed to be fax in tomorrow please.

## 2023-10-21 NOTE — Telephone Encounter (Signed)
 Form received and successfully faxed back to [facility] on 10/21/2023. Fax #: [phone].

## 2023-10-27 ENCOUNTER — Emergency Department (HOSPITAL_COMMUNITY): Admission: EM | Admit: 2023-10-27 | Discharge: 2023-10-27 | Disposition: A | Discharge status: Home / Self Care

## 2023-10-27 ENCOUNTER — Encounter (HOSPITAL_COMMUNITY): Payer: Self-pay

## 2023-10-27 ENCOUNTER — Emergency Department (HOSPITAL_COMMUNITY)

## 2023-10-27 ENCOUNTER — Other Ambulatory Visit: Payer: Self-pay

## 2023-10-27 ENCOUNTER — Ambulatory Visit: Payer: Self-pay

## 2023-10-27 DIAGNOSIS — G4489 Other headache syndrome: Secondary | ICD-10-CM | POA: Diagnosis not present

## 2023-10-27 DIAGNOSIS — E876 Hypokalemia: Secondary | ICD-10-CM | POA: Insufficient documentation

## 2023-10-27 DIAGNOSIS — R0602 Shortness of breath: Secondary | ICD-10-CM | POA: Diagnosis not present

## 2023-10-27 DIAGNOSIS — K838 Other specified diseases of biliary tract: Secondary | ICD-10-CM | POA: Diagnosis not present

## 2023-10-27 DIAGNOSIS — Z7901 Long term (current) use of anticoagulants: Secondary | ICD-10-CM | POA: Insufficient documentation

## 2023-10-27 DIAGNOSIS — R112 Nausea with vomiting, unspecified: Secondary | ICD-10-CM | POA: Diagnosis not present

## 2023-10-27 DIAGNOSIS — R42 Dizziness and giddiness: Secondary | ICD-10-CM | POA: Diagnosis not present

## 2023-10-27 DIAGNOSIS — R059 Cough, unspecified: Secondary | ICD-10-CM | POA: Diagnosis not present

## 2023-10-27 DIAGNOSIS — R109 Unspecified abdominal pain: Secondary | ICD-10-CM | POA: Diagnosis not present

## 2023-10-27 DIAGNOSIS — K7689 Other specified diseases of liver: Secondary | ICD-10-CM | POA: Diagnosis not present

## 2023-10-27 DIAGNOSIS — R000 Tachycardia, unspecified: Secondary | ICD-10-CM | POA: Diagnosis not present

## 2023-10-27 DIAGNOSIS — R1110 Vomiting, unspecified: Secondary | ICD-10-CM | POA: Diagnosis not present

## 2023-10-27 LAB — COMPREHENSIVE METABOLIC PANEL WITH GFR
ALT: 15 U/L (ref 0–44)
AST: 19 U/L (ref 15–41)
Albumin: 3.5 g/dL (ref 3.5–5.0)
Alkaline Phosphatase: 105 U/L (ref 38–126)
Anion gap: 11 (ref 5–15)
BUN: <5 mg/dL — ABNORMAL LOW (ref 8–23)
CO2: 24 mmol/L (ref 22–32)
Calcium: 9.2 mg/dL (ref 8.9–10.3)
Chloride: 104 mmol/L (ref 98–111)
Creatinine, Ser: 0.85 mg/dL (ref 0.44–1.00)
GFR, Estimated: >60 mL/min (ref >60)
Glucose, Bld: 112 mg/dL — ABNORMAL HIGH (ref 70–99)
Potassium: 3.3 mmol/L — ABNORMAL LOW (ref 3.5–5.1)
Sodium: 139 mmol/L (ref 135–145)
Total Bilirubin: 0.7 mg/dL (ref 0.0–1.2)
Total Protein: 7.2 g/dL (ref 6.5–8.1)

## 2023-10-27 LAB — CBC WITH DIFFERENTIAL/PLATELET
Abs Immature Granulocytes: 0.02 K/uL (ref 0.00–0.07)
Basophils Absolute: 0 K/uL (ref 0.0–0.1)
Basophils Relative: 0 %
Eosinophils Absolute: 0.1 K/uL (ref 0.0–0.5)
Eosinophils Relative: 1 %
HCT: 35.3 % — ABNORMAL LOW (ref 36.0–46.0)
Hemoglobin: 11.9 g/dL — ABNORMAL LOW (ref 12.0–15.0)
Immature Granulocytes: 0 %
Lymphocytes Relative: 33 %
Lymphs Abs: 2.3 K/uL (ref 0.7–4.0)
MCH: 28.1 pg (ref 26.0–34.0)
MCHC: 33.7 g/dL (ref 30.0–36.0)
MCV: 83.3 fL (ref 80.0–100.0)
Monocytes Absolute: 0.6 K/uL (ref 0.1–1.0)
Monocytes Relative: 8 %
Neutro Abs: 4.1 K/uL (ref 1.7–7.7)
Neutrophils Relative %: 58 %
Platelets: 300 K/uL (ref 150–400)
RBC: 4.24 MIL/uL (ref 3.87–5.11)
RDW: 14.2 % (ref 11.5–15.5)
WBC: 7.1 K/uL (ref 4.0–10.5)
nRBC: 0 % (ref 0.0–0.2)

## 2023-10-27 LAB — URINALYSIS, ROUTINE W REFLEX MICROSCOPIC
Bilirubin Urine: NEGATIVE
Glucose, UA: NEGATIVE mg/dL
Hgb urine dipstick: NEGATIVE
Ketones, ur: NEGATIVE mg/dL
Nitrite: NEGATIVE
Protein, ur: NEGATIVE mg/dL
Specific Gravity, Urine: 1.041 — ABNORMAL HIGH (ref 1.005–1.030)
pH: 6 (ref 5.0–8.0)

## 2023-10-27 LAB — RESP PANEL BY RT-PCR (RSV, FLU A&B, COVID)  RVPGX2
Influenza A by PCR: NEGATIVE
Influenza B by PCR: NEGATIVE
Resp Syncytial Virus by PCR: NEGATIVE
SARS Coronavirus 2 by RT PCR: NEGATIVE

## 2023-10-27 LAB — LIPASE, BLOOD: Lipase: 21 U/L (ref 11–51)

## 2023-10-27 MED ORDER — ONDANSETRON HCL 4 MG/2ML IJ SOLN
4.0000 mg | Freq: Once | INTRAMUSCULAR | Status: AC
  Administered 2023-10-27: 4 mg via INTRAVENOUS
  Filled 2023-10-27: qty 2

## 2023-10-27 MED ORDER — ONDANSETRON 4 MG PO TBDP
4.0000 mg | ORAL_TABLET | Freq: Once | ORAL | Status: AC
  Administered 2023-10-27: 4 mg via ORAL
  Filled 2023-10-27: qty 1

## 2023-10-27 MED ORDER — SODIUM CHLORIDE 0.9 % IV BOLUS
1000.0000 mL | Freq: Once | INTRAVENOUS | Status: AC
Start: 2023-10-27 — End: 2023-10-27
  Administered 2023-10-27: 1000 mL via INTRAVENOUS

## 2023-10-27 MED ORDER — POTASSIUM CHLORIDE CRYS ER 20 MEQ PO TBCR
30.0000 meq | EXTENDED_RELEASE_TABLET | Freq: Once | ORAL | Status: AC
  Administered 2023-10-27: 30 meq via ORAL
  Filled 2023-10-27: qty 1

## 2023-10-27 MED ORDER — IOHEXOL 350 MG/ML SOLN
75.0000 mL | Freq: Once | INTRAVENOUS | Status: AC | PRN
  Administered 2023-10-27: 75 mL via INTRAVENOUS

## 2023-10-27 NOTE — ED Provider Notes (Signed)
 [place] EMERGENCY DEPARTMENT AT [facility] Provider Note   CSN: [id number] Arrival date & time: 10/27/23  1110     Patient presents with: Shortness of Breath and Dizziness   Stacy [name] is a 63 y.o. female.    Shortness of Breath Dizziness Associated symptoms: shortness of breath        Patient presents because of nausea and vomiting.  Patient states that she has been have nausea vomiting for the past month.  Hard time taking p.o.  Not take any kind of antiacid medication.  No H2 or PPI use that she reports.  Patient states that sometimes she will get short of breath and lie down at night.  No exertional chest pain or shortness of breath.  No clear chest pain or hemoptysis.  Prior to Admission medications   Medication Sig Start Date End Date Taking? Authorizing Provider  Accu-Chek Softclix Lancets lancets USE AS INSTRUCTED 3 TIMES A DAY 09/15/23   Vicci Barnie NOVAK, MD  albuterol  (PROVENTIL ) (2.5 MG/3ML) 0.083% nebulizer solution USE ONE VIAL FOR UP TO 3 TIMES DAILY. 09/13/23   Vicci Barnie NOVAK, MD  allopurinol  (ZYLOPRIM ) 100 MG tablet TAKE 1 TABLET (100 MG TOTAL) BY MOUTH DAILY.(AM) 06/01/23   Vicci Barnie NOVAK, MD  Blood Glucose Monitoring Suppl (ACCU-CHEK AVIVA PLUS) w/Device KIT UAD to check blood sugars. 09/15/23   Vicci Barnie NOVAK, MD  celecoxib  (CELEBREX ) 200 MG capsule Take 1 capsule (200 mg total) by mouth daily. 09/13/23   Vicci Barnie NOVAK, MD  clopidogrel  (PLAVIX ) 75 MG tablet TAKE 1 TABLET (75 MG TOTAL) BY MOUTH DAILY. (AM) 08/04/23   Vicci Barnie NOVAK, MD  diclofenac  Sodium (VOLTAREN ) 1 % GEL Apply 2 g topically 4 (four) times daily. 12/24/22   Gretta Bertrum ORN, PA-C  DULoxetine  (CYMBALTA ) 60 MG capsule TAKE 1 CAPSULE (60 MG TOTAL) BY MOUTH DAILY (AM) 09/13/23   Vicci Barnie NOVAK, MD  EPINEPHrine  0.3 mg/0.3 mL IJ SOAJ injection 0.3 mg IM x 1 PRN for allergic reaction 09/07/22   Vicci Barnie NOVAK, MD  FARXIGA  10 MG TABS tablet TAKE 1 TABLET (10 MG  TOTAL) BY MOUTH DAILY BEFORE BREAKFAST. 06/01/23   Vicci Barnie NOVAK, MD  [name] 325 (65 Fe) MG tablet TAKE ONE TABLET ( 325 MG ) BY MOUTH DAILY (AM) 04/07/23   Vicci Barnie NOVAK, MD  fluticasone  (FLONASE ) 50 MCG/ACT nasal spray PLACE 1 SPRAY INTO BOTH NOSTRILS DAILY. 09/01/23   Vicci Barnie NOVAK, MD  glucose blood (ACCU-CHEK AVIVA PLUS) test strip USE AS INSTRUCTED 3 TIMES A DAY. E11.9, Z79.84 09/15/23   Vicci Barnie NOVAK, MD  glycopyrrolate  (ROBINUL ) 2 MG tablet Take 2 mg by mouth 3 (three) times daily. 11/04/21   [provider]  GVOKE PFS  1 MG/0.2ML SOSY USE ONE SYRINGE (1 MG) UNDER THE SKIN AS NEEDED FOR SEVERE SYMPTOMATIC LOW BLOOD SUGAR 10/18/23   Vicci Barnie NOVAK, MD  hydrOXYzine  (ATARAX ) 10 MG tablet TAKE 1 TABLET IN THE MORNING, THEN 1 TABLET AT NOON AND 2 TABLETS IN THE EVENING AS NEEDED. 08/04/23   Vicci Barnie NOVAK, MD  Lancets (ACCU-CHEK SOFT TOUCH) lancets Use as instructed 10/20/17   McClung, Angela M, PA-C  loratadine  (CLARITIN ) 10 MG tablet TAKE 1 TABLET (10 MG TOTAL) BY MOUTH DAILY AS NEEDED FOR ALLERGIES. (AM) 09/01/23   Vicci Barnie NOVAK, MD  melatonin 5 MG TABS Take 5 mg by mouth at bedtime.    [provider]  metFORMIN  (GLUCOPHAGE -XR) 750 MG 24 hr tablet  TAKE 1 TABLET (750 MG TOTAL) BY MOUTH DAILY WITH BREAKFAST. 09/01/23   Vicci Barnie NOVAK, MD  methocarbamol  (ROBAXIN ) 500 MG tablet TAKE 1 TABLET (500 MG TOTAL) BY MOUTH EVERY 6 (SIX) HOURS AS NEEDED FOR MUSCLE SPASMS. Patient not taking: Reported on 10/18/2023 06/09/22   Gretta Bertrum ORN, PA-C  Methylnaltrexone  Bromide 8 MG/0.4ML SOLN Take 0.4 mLs by mouth as needed. Take once daily as needed 06/01/22   Rogelia Jerilynn RAMAN, MD  montelukast  (SINGULAIR ) 10 MG tablet Take 10 mg by mouth daily.    [provider]  Multiple Vitamins-Minerals (CENTRUM SILVER 50+WOMEN PO) Take 1 tablet by mouth daily.    [provider]  ondansetron  (ZOFRAN -ODT) 4 MG disintegrating tablet Take 4 mg by mouth once.     [provider]  pantoprazole  (PROTONIX ) 40 MG tablet Take 40 mg by mouth daily.    [provider]  potassium chloride  SA (KLOR-CON  M) 20 MEQ tablet TAKE 1 TABLET (20 MEQ TOTAL) BY MOUTH DAILY.(NOON ) 09/01/23   Johnson, Deborah B, MD  Respiratory Therapy Supplies (FLUTTER) DEVI Use after breathing treatment 4 times daily 06/06/18   Brien Belvie BRAVO, MD  rosuvastatin  (CRESTOR ) 20 MG tablet Take 1 tablet (20 mg total) by mouth daily. Stop Atorvastatin  09/13/23   Vicci Barnie NOVAK, MD  SPIRIVA  HANDIHALER 18 MCG inhalation capsule Place 1 capsule (18 mcg total) into inhaler and inhale daily. 09/13/23   Vicci Barnie NOVAK, MD  SYMBICORT  160-4.5 MCG/ACT inhaler Inhale 1 puff into the lungs 2 (two) times daily. 10/04/21   [provider]  triamcinolone  cream (KENALOG ) 0.1 % Apply 1 Application topically 2 (two) times daily as needed (irritation). 06/17/22   [place] Jon HERO, PA-C  valsartan -hydrochlorothiazide  (DIOVAN -HCT) 160-12.5 MG tablet TAKE ONE TABLET BY MOUTH ONCE DAILY (AM) 09/01/23   Vicci Barnie NOVAK, MD  Fluticasone -Salmeterol (ADVAIR) 500-50 MCG/DOSE AEPB Inhale 1 puff into the lungs every 12 (twelve) hours.    04/16/11  [provider]  lisinopril (PRINIVIL,ZESTRIL) 40 MG tablet Take 40 mg by mouth daily.   Patient not taking: Reported on 05/10/2023  04/16/11  [provider]    Allergies: Other, Shellfish allergy , Ace inhibitors, Celebrex  [celecoxib ], Shellfish-derived products, and Aspirin     Review of Systems  Respiratory:  Positive for shortness of breath.   Neurological:  Positive for dizziness.    Updated Vital Signs BP (!) 158/87 (BP Location: Right Arm)   Pulse 79   Temp 98.7 F (37.1 C) (Oral)   Resp 18   LMP 09/01/2010   SpO2 96%   Physical Exam Vitals and nursing note reviewed.  Constitutional:      General: She is not in acute distress.    Appearance: She is well-developed.  HENT:     Head: Normocephalic and atraumatic.   Eyes:     Conjunctiva/sclera: Conjunctivae normal.  Cardiovascular:     Rate and Rhythm: Normal rate and regular rhythm.     Heart sounds: No murmur heard. Pulmonary:     Effort: Pulmonary effort is normal. No respiratory distress.     Breath sounds: Normal breath sounds.  Abdominal:     Palpations: Abdomen is soft.     Tenderness: There is no abdominal tenderness.  Musculoskeletal:        General: No swelling.     Cervical back: Neck supple.  Skin:    General: Skin is warm and dry.     Capillary Refill: Capillary refill takes less than 2 seconds.  Neurological:  Mental Status: She is alert.  Psychiatric:        Mood and Affect: Mood normal.     (all labs ordered are listed, but only abnormal results are displayed) Labs Reviewed  COMPREHENSIVE METABOLIC PANEL WITH GFR - Abnormal; Notable for the following components:      Result Value   Potassium 3.3 (*)    Glucose, Bld 112 (*)    BUN <5 (*)    All other components within normal limits  CBC WITH DIFFERENTIAL/PLATELET - Abnormal; Notable for the following components:   Hemoglobin 11.9 (*)    HCT 35.3 (*)    All other components within normal limits  RESP PANEL BY RT-PCR (RSV, FLU A&B, COVID)  RVPGX2  LIPASE, BLOOD  URINALYSIS, ROUTINE W REFLEX MICROSCOPIC    EKG: EKG Interpretation Date/Time:  Wednesday October 27 2023 11:31:06 EDT Ventricular Rate:  86 PR Interval:  124 QRS Duration:  76 QT Interval:  358 QTC Calculation: 428 R Axis:   70  Text Interpretation: Normal sinus rhythm Possible Left atrial enlargement Borderline ECG When compared with ECG of 30-Apr-2022 08:50, PREVIOUS ECG IS PRESENT Confirmed by Simon Rea [phone]) on 10/27/2023 5:59:54 PM  Radiology: CT ABDOMEN PELVIS W CONTRAST Result Date: 10/27/2023 CLINICAL DATA:  Abdominal pain, acute nonlocalized EXAM: CT ABDOMEN AND PELVIS WITH CONTRAST TECHNIQUE: Multidetector CT imaging of the abdomen and pelvis was performed using the standard  protocol following bolus administration of intravenous contrast. RADIATION DOSE REDUCTION: This exam was performed according to the departmental dose-optimization program which includes automated exposure control, adjustment of the mA and/or kV according to patient size and/or use of iterative reconstruction technique. CONTRAST:  75mL OMNIPAQUE  IOHEXOL  350 MG/ML SOLN COMPARISON:  Radiograph 09/27/2023, 06/01/2022 FINDINGS: Lower chest: Lung bases are clear. Hepatobiliary: Postcholecystectomy. The common bile duct is dilated to 10 mm (image 27/2. No intrahepatic biliary duct dilatation. The common bile duct dilatation is not changed from comparison CT and most consistent with physiologic post cholecystectomy dilatation. Benign cystic lesion the dome of the liver. Benign cystic lesions subcapsular RIGHT hepatic lobe. Both these lesions measure less than 10 mm. No change from recent CT exam. Or remote exam from 06/01/2022 also demonstrates lesion is unchanged. Pancreas: Pancreas is normal. No ductal dilatation. No pancreatic inflammation. Spleen: Normal spleen Adrenals/urinary tract: Bilateral thickening of the adrenal glands not changed from comparison CT 2024. Kidneys, ureters and bladder normal. Stomach/Bowel: Proximal stomach is normal. There is thickening through the pylorus of the stomach which is similar to comparison exams (image 23/2) the duodenum small-bowel normal. The colon and rectosigmoid colon are normal. Vascular/Lymphatic: Abdominal aorta is normal caliber with atherosclerotic calcification. There is no retroperitoneal or periportal lymphadenopathy. No pelvic lymphadenopathy. Reproductive: Uterus and adnexa unremarkable. Other: No free fluid. Musculoskeletal: Posterior lumbar fusion. No aggressive osseous lesion IMPRESSION: 1. Thickening of the pylorus of the stomach is similar to comparison exams. No evidence of gastric outlet obstruction. Consider upper endoscopy. 2. Postcholecystectomy with  physiologic dilatation of the common bile duct. 3. Benign hepatic cysts. 4.  Aortic Atherosclerosis (ICD10-I70.0). 5. No large bowel or small bowel abnormality identified. Electronically Signed   By: Jackquline Boxer M.D.   On: 10/27/2023 13:58   DG Chest 2 View Result Date: 10/27/2023 CLINICAL DATA:  Shortness of breath with dizziness and emesis. Cough. EXAM: CHEST - 2 VIEW COMPARISON:  01/19/2019 FINDINGS: Lungs are adequately inflated and otherwise clear. Cardiomediastinal silhouette and remainder of the exam is unchanged. IMPRESSION: No active cardiopulmonary disease. Electronically Signed  By: Toribio Agreste M.D.   On: 10/27/2023 12:51     Procedures   Medications Ordered in the ED  ondansetron  (ZOFRAN -ODT) disintegrating tablet 4 mg (4 mg Oral Given 10/27/23 1156)  iohexol  (OMNIPAQUE ) 350 MG/ML injection 75 mL (75 mLs Intravenous Contrast Given 10/27/23 1313)  sodium chloride  0.9 % bolus 1,000 mL (1,000 mLs Intravenous New Bag/Given 10/27/23 1821)  potassium chloride  (KLOR-CON  M) CR tablet 30 mEq (30 mEq Oral Given 10/27/23 1829)  ondansetron  (ZOFRAN ) injection 4 mg (4 mg Intravenous Given 10/27/23 1825)                                    Medical Decision Making Risk Prescription drug management.   = Patient presents because of nausea and vomiting.  Patient states that she has been have nausea vomiting for the past month.  Hard time taking p.o.  Not take any kind of antiacid medication.  No H2 or PPI use that she reports.  Patient states that sometimes she will get short of breath and lie down at night.  No exertional chest pain or shortness of breath.  No clear chest pain or hemoptysis.   Upon exam, patient hemodynamically stable.  ANO x 3 GCS 15.  On room air.    In terms of patient's wrist breath.  Feels like she knows that sometimes when she lays down.  No history of DVT or PE.  No chest pain.  No pleuritic chest pain.  No hemoptysis.  No tachypnea or tachycardia.  No further  workup needed at this time for this.  I think this is likely some mild amount of orthopnea.  And follow-up outpatient.  Chest x-ray unremarkable.  No evidence of hypervolemia on my exam.   In terms of patient's nausea and vomiting.  Mild hypokalemia.  Replaced orally after antiemetics were given.  Did obtain CT abdomen pelvis.  Does show thickening of the pylorus which has been seen on prior imaging.  Patient has follow-up tomorrow with gastroenterology.  She is post have endoscopy tomorrow which I think is what she needs.  Currently not taking any kind of H2 or PPI blocker.  Strong recommendation to follow-up with this appointment tomorrow.  Able to tolerate p.o. here in the ED after antiemetics were given.  Patient remained hemodynamic stable.     Final diagnoses:  Nausea and vomiting, unspecified vomiting type    ED Discharge Orders     None          Simon Lavonia SAILOR, MD 10/27/23 1925

## 2023-10-27 NOTE — ED Provider Triage Note (Signed)
 Emergency Medicine Provider Triage Evaluation Note  Stacy [name] , a 63 y.o. female  was evaluated in triage.  Pt complains of nausea, vomiting, diarrhea for 1 month. Supposed to have endoscopy tomorrow. Also with cough. Also with lower abdominal pain.  Review of Systems  Positive:  Negative:   Physical Exam  BP (!) 154/86   Pulse 84   Temp 98.4 F (36.9 C) (Oral)   Resp 16   LMP 09/01/2010   SpO2 100%  Gen:   Awake, no distress   Resp:  Normal effort  MSK:   Moves extremities without difficulty  Other:    Medical Decision Making  Medically screening exam initiated at 11:47 AM.  Appropriate orders placed.  Stacy [name] was informed that the remainder of the evaluation will be completed by another provider, this initial triage assessment does not replace that evaluation, and the importance of remaining in the ED until their evaluation is complete.     Stacy [name], [place] 10/27/23 1148

## 2023-10-27 NOTE — Discharge Instructions (Addendum)
 Please follow-up with gastroenterology tomorrow.  This is important to do so.  They would likely start you on a PPI.  You do have some swelling in 1 area of your stomach which can cause the symptoms.  Do not eat after midnight.

## 2023-10-27 NOTE — Telephone Encounter (Signed)
 FYI Only or Action Required?: FYI only for provider.  Patient was last seen in primary care on 09/13/2023 by Vicci Barnie NOVAK, MD.  Called Nurse Triage reporting Shortness of Breath.  Symptoms began about a month ago.  Interventions attempted: Prescription medications: nebulizer, cpap and Rest, hydration, or home remedies.  Symptoms are: gradually worsening.  Triage Disposition: Call EMS 911 Now  Patient/caregiver understands and will follow disposition?: Yes     Copied from CRM #[id number]. Topic: Clinical - Red Word Triage >> Oct 27, 2023  9:57 AM Charlet HERO wrote: Red Word that prompted transfer to Nurse Triage: shortness of breath nausea, vomiting, diahreah and constant cough. Dr Vicci [facility] Reason for Disposition  SEVERE difficulty breathing (e.g., struggling for each breath, speaks in single words)  Answer Assessment - Initial Assessment Questions Pt states that she is having shortness of breath. She can't complete a breathing treatment because it then causes her nausea and vomiting to get worse. She can't wear her CPAP at night. Endoscopy scheduled for tomorrow. She states that laying down makes it worse. She states that she has been having these vomiting issues for a month. She says the dissolvable tabs they gave her don't help. It takes her all day to get a bottle of water down. She states she is peeing. She states she is also having diarrhea. When asked if she has dry mouth and dizziness she said yes to both of those.   When she walked up to stairs she became audibly short of breath. 911 was called.   1. RESPIRATORY STATUS: Describe your breathing? (e.g., wheezing, shortness of breath, unable to speak, severe coughing)      Short of breath 2. ONSET: When did this breathing problem begin?      One month 3. PATTERN Does the difficult breathing come and go, or has it been constant since it started?      intermittent 4. SEVERITY: How bad is your breathing? (e.g., mild,  moderate, severe)      Mod/severe 5. RECURRENT SYMPTOM: Have you had difficulty breathing before? If Yes, ask: When was the last time? and What happened that time?      yes 6. CARDIAC HISTORY: Do you have any history of heart disease? (e.g., heart attack, angina, bypass surgery, angioplasty)     no 7. LUNG HISTORY: Do you have any history of lung disease?  (e.g., pulmonary embolus, asthma, emphysema)     Copd/asthma 8. CAUSE: What do you think is causing the breathing problem?       9. OTHER SYMPTOMS: Do you have any other symptoms? (e.g., chest pain, cough, dizziness, fever, runny nose)     Diarrhea, nausea, vomiting, dizziness, when she takes a deep breath or coughs she throws up.  10. O2 SATURATION MONITOR:  Do you use an oxygen  saturation monitor (pulse oximeter) at home? If Yes, ask: What is your reading (oxygen  level) today? What is your usual oxygen  saturation reading? (e.g., 95%)  Protocols used: Breathing Difficulty-A-AH

## 2023-10-27 NOTE — Telephone Encounter (Signed)
 Noted

## 2023-10-27 NOTE — ED Triage Notes (Signed)
 Patient c/o shob, dizziness and emesis, patient says she has had this for a month, only able to tolerate fluids in small amounts, unable to get meds down x 1 month, hence she is HTN. Patient has endoscopy scheduled.

## 2023-10-28 ENCOUNTER — Other Ambulatory Visit (HOSPITAL_COMMUNITY): Payer: Self-pay | Admitting: Gastroenterology

## 2023-10-28 ENCOUNTER — Ambulatory Visit (INDEPENDENT_AMBULATORY_CARE_PROVIDER_SITE_OTHER)

## 2023-10-28 ENCOUNTER — Ambulatory Visit: Payer: Self-pay

## 2023-10-28 VITALS — BP 134/79 | HR 89 | Temp 99.0°F | Resp 16 | Ht 66.0 in | Wt 152.0 lb

## 2023-10-28 DIAGNOSIS — R052 Subacute cough: Secondary | ICD-10-CM | POA: Diagnosis not present

## 2023-10-28 DIAGNOSIS — R1114 Bilious vomiting: Secondary | ICD-10-CM

## 2023-10-28 DIAGNOSIS — K2950 Unspecified chronic gastritis without bleeding: Secondary | ICD-10-CM | POA: Diagnosis not present

## 2023-10-28 DIAGNOSIS — R112 Nausea with vomiting, unspecified: Secondary | ICD-10-CM

## 2023-10-28 DIAGNOSIS — K2930 Chronic superficial gastritis without bleeding: Secondary | ICD-10-CM | POA: Diagnosis not present

## 2023-10-28 MED ORDER — BENZONATATE 200 MG PO CAPS: 200.0000 mg | ORAL_CAPSULE | Freq: Two times a day (BID) | ORAL | 0 refills | Status: DC | PRN

## 2023-10-28 MED ORDER — ONDANSETRON 4 MG PO TBDP: 4.0000 mg | ORAL_TABLET | Freq: Three times a day (TID) | ORAL | 0 refills | Status: DC | PRN

## 2023-10-28 NOTE — Progress Notes (Unsigned)
 Patient ID: Stacy [name], female    DOB: Feb 22, 1960  MRN: [id number]  CC: Medical Management of Chronic Issues   Subjective: Stacy [name] is a 63 y.o. female with past medical history of *** who presents to clinic for  Last CPE:  Last eye exam:  Last dental exam:  Exercise:  Diet:    Last pap:  LMP:  Last Mammogram: not indicated Sexually active:  Desires STD testing:   Patient Active Problem List   Diagnosis Date Noted   Arthritis of carpometacarpal [facility]) joint of right thumb 03/26/2023   Carpal tunnel syndrome, right upper limb 03/26/2023   Status post revision of total replacement of right knee 05/12/2022   History of colonic polyps 03/20/2022   Failed total knee, right, subsequent encounter 03/02/2022   Loose right total knee arthroplasty 03/02/2022   Mixed stress and urge urinary incontinence 12/04/2021   Functional fecal incontinence 12/04/2021   IBS (irritable bowel syndrome) 11/19/2021   Spondylolisthesis, lumbar region    Other spondylosis with radiculopathy, lumbar region    Other secondary scoliosis, lumbar region    Fusion of spine of lumbar region 03/25/2021   Colon polyps 06/06/2020   Lupus 06/06/2020   Allergic rhinitis due to animal (cat) (dog) hair and dander 04/08/2020   Allergic rhinitis due to pollen 04/08/2020   Food allergy  04/08/2020   Acute medial meniscus tear, left, subsequent encounter 03/21/2020   Chronic pain of left knee 02/15/2020   Paresthesia of skin 11/16/2019   History of total knee replacement, right 11/16/2019   Tobacco abuse 11/16/2019   Centrilobular emphysema (HCC) 08/10/2019   Incidental lung nodule, > 3mm and < 8mm 08/10/2019   OSA on CPAP 08/10/2019   Dyspnea on exertion 08/02/2019   Hyperlipidemia 08/02/2019   Lumbar radiculopathy 04/24/2019   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   Post laminectomy syndrome 02/23/2019   Abnormality of gait 02/23/2019   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    Unilateral primary osteoarthritis, left hip 12/28/2018   Lesion of skin of left ear 12/26/2018   Primary osteoarthritis of left hip 12/02/2018   Iron  deficiency anemia 10/16/2018   Chronic pain syndrome 09/08/2018   Chronic pain of right knee 08/11/2018   Status post lumbar laminectomy 07/15/2018   Peripheral arterial disease 04/05/2018   Moderate persistent asthma without complication 06/29/2017   Environmental and seasonal allergies 06/29/2017   Controlled type 2 diabetes mellitus with diabetic polyneuropathy, without long-term current use of insulin  (HCC) 06/29/2017   Perennial allergic rhinitis 04/08/2017   Sensorineural hearing loss (SNHL), bilateral 04/08/2017   Chronic pansinusitis 03/25/2017   Eustachian tube dysfunction, bilateral 03/25/2017   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   Herniation of lumbar intervertebral disc with radiculopathy 10/02/2016    Class: Chronic   Alopecia areata 08/19/2016   Chondromalacia of both patellae 06/03/2015    Class: Chronic   Spinal stenosis, lumbar region, with neurogenic claudication 06/03/2015   Tobacco use disorder 04/25/2015   DJD (degenerative joint disease) of knee 01/04/2015   Hemorrhoid 11/14/2014   Gout of big toe 07/19/2014   Essential hypertension 08/14/2013   Gastroesophageal reflux disease without esophagitis 08/14/2013   COPD (chronic obstructive pulmonary disease) (HCC) 04/17/2011     Current Outpatient Medications on File Prior to Visit  Medication Sig Dispense Refill   Accu-Chek Softclix Lancets lancets USE AS INSTRUCTED 3 TIMES A DAY 100 each 2   albuterol  (PROVENTIL ) (2.5 MG/3ML) 0.083% nebulizer solution USE ONE VIAL FOR UP TO 3  TIMES DAILY. 360 mL 2   allopurinol  (ZYLOPRIM ) 100 MG tablet TAKE 1 TABLET (100 MG TOTAL) BY MOUTH DAILY.(AM) 90 tablet 1   Blood Glucose Monitoring Suppl (ACCU-CHEK AVIVA PLUS) w/Device KIT UAD to check blood sugars. 1 kit 0   celecoxib  (CELEBREX ) 200 MG capsule Take 1 capsule (200 mg total) by  mouth daily. 30 capsule 3   clopidogrel  (PLAVIX ) 75 MG tablet TAKE 1 TABLET (75 MG TOTAL) BY MOUTH DAILY. (AM) 90 tablet 1   diclofenac  Sodium (VOLTAREN ) 1 % GEL Apply 2 g topically 4 (four) times daily. 350 g 5   DULoxetine  (CYMBALTA ) 60 MG capsule TAKE 1 CAPSULE (60 MG TOTAL) BY MOUTH DAILY (AM) 90 capsule 1   EPINEPHrine  0.3 mg/0.3 mL IJ SOAJ injection 0.3 mg IM x 1 PRN for allergic reaction 1 each 1   FARXIGA  10 MG TABS tablet TAKE 1 TABLET (10 MG TOTAL) BY MOUTH DAILY BEFORE BREAKFAST. 90 tablet 1   [name] 325 (65 Fe) MG tablet TAKE ONE TABLET ( 325 MG ) BY MOUTH DAILY (AM) 100 tablet 1   fluticasone  (FLONASE ) 50 MCG/ACT nasal spray PLACE 1 SPRAY INTO BOTH NOSTRILS DAILY. 16 g 0   glucose blood (ACCU-CHEK AVIVA PLUS) test strip USE AS INSTRUCTED 3 TIMES A DAY. E11.9, Z79.84 100 each 0   glycopyrrolate  (ROBINUL ) 2 MG tablet Take 2 mg by mouth 3 (three) times daily.     GVOKE PFS  1 MG/0.2ML SOSY USE ONE SYRINGE (1 MG) UNDER THE SKIN AS NEEDED FOR SEVERE SYMPTOMATIC LOW BLOOD SUGAR 0.2 mL 0   hydrOXYzine  (ATARAX ) 10 MG tablet TAKE 1 TABLET IN THE MORNING, THEN 1 TABLET AT NOON AND 2 TABLETS IN THE EVENING AS NEEDED. 120 tablet 1   Lancets (ACCU-CHEK SOFT TOUCH) lancets Use as instructed 100 each 12   loratadine  (CLARITIN ) 10 MG tablet TAKE 1 TABLET (10 MG TOTAL) BY MOUTH DAILY AS NEEDED FOR ALLERGIES. (AM) 90 tablet 1   melatonin 5 MG TABS Take 5 mg by mouth at bedtime.     metFORMIN  (GLUCOPHAGE -XR) 750 MG 24 hr tablet TAKE 1 TABLET (750 MG TOTAL) BY MOUTH DAILY WITH BREAKFAST. 90 tablet 1   methocarbamol  (ROBAXIN ) 500 MG tablet TAKE 1 TABLET (500 MG TOTAL) BY MOUTH EVERY 6 (SIX) HOURS AS NEEDED FOR MUSCLE SPASMS. 40 tablet 1   Methylnaltrexone  Bromide 8 MG/0.4ML SOLN Take 0.4 mLs by mouth as needed. Take once daily as needed 2.1 mL 0   montelukast  (SINGULAIR ) 10 MG tablet Take 10 mg by mouth daily.     Multiple Vitamins-Minerals (CENTRUM SILVER 50+WOMEN PO) Take 1 tablet by mouth daily.      ondansetron  (ZOFRAN -ODT) 4 MG disintegrating tablet Take 4 mg by mouth once.     pantoprazole  (PROTONIX ) 40 MG tablet Take 40 mg by mouth daily.     potassium chloride  SA (KLOR-CON  M) 20 MEQ tablet TAKE 1 TABLET (20 MEQ TOTAL) BY MOUTH DAILY.(NOON ) 90 tablet 1   Respiratory Therapy Supplies (FLUTTER) DEVI Use after breathing treatment 4 times daily 1 each 0   rosuvastatin  (CRESTOR ) 20 MG tablet Take 1 tablet (20 mg total) by mouth daily. Stop Atorvastatin  90 tablet 3   SPIRIVA  HANDIHALER 18 MCG inhalation capsule Place 1 capsule (18 mcg total) into inhaler and inhale daily. 90 capsule 3   SYMBICORT  160-4.5 MCG/ACT inhaler Inhale 1 puff into the lungs 2 (two) times daily.     triamcinolone  cream (KENALOG ) 0.1 % Apply 1 Application topically 2 (two) times  daily as needed (irritation). 45 g 1   valsartan -hydrochlorothiazide  (DIOVAN -HCT) 160-12.5 MG tablet TAKE ONE TABLET BY MOUTH ONCE DAILY (AM) 90 tablet 1   [DISCONTINUED] Fluticasone -Salmeterol (ADVAIR) 500-50 MCG/DOSE AEPB Inhale 1 puff into the lungs every 12 (twelve) hours.       [DISCONTINUED] lisinopril (PRINIVIL,ZESTRIL) 40 MG tablet Take 40 mg by mouth daily.   (Patient not taking: Reported on 05/10/2023)     Current Facility-Administered Medications on File Prior to Visit  Medication Dose Route Frequency Provider Last Rate Last Admin   glucose chewable tablet 12 g  3 tablet Oral Once Vicci Barnie NOVAK, MD        Allergies  Allergen Reactions   Other Shortness Of Breath    UNSPECIFIED AGENTS Allergic to perfumes and cleaning products   Shellfish Allergy  Anaphylaxis    Per allergy  test.   Ace Inhibitors Cough and Other (See Comments)        Celebrex  [Celecoxib ]      upset stomach  Pt is taking med*   [employer] Other (See Comments)   Aspirin  Nausea Only and Other (See Comments)    stomach upset  Other reaction(s): Nausea Only    Social History   Socioeconomic History   Marital status: Married    Spouse  name: Not on file   Number of children: 2   Years of education: Not on file   Highest education level: Not on file  Occupational History   Occupation: unemployed    Employer: UNEMPLOYED  Tobacco Use   Smoking status: Former   Smokeless tobacco: Never  Advertising account planner   Vaping status: Never Used  Substance and Sexual Activity   Alcohol use: No   Drug use: No   Sexual activity: Yes    Birth control/protection: Surgical, Post-menopausal    Comment: tubal ligation  Other Topics Concern   Not on file  Social History Narrative   Left Handed    Lives in a two story apartment. With husband   Drinks Caffeine 1 cup   retired   Social Drivers of [employer] Strain: Medium Risk (01/08/2023)   Overall Financial Resource Strain (CARDIA)    Difficulty of Paying Living Expenses: Somewhat hard  Food Insecurity: No Food Insecurity (10/18/2023)   Hunger Vital Sign    Worried About Running Out of Food in the Last Year: Never true    Ran Out of Food in the Last Year: Never true  Transportation Needs: No Transportation Needs (10/18/2023)   PRAPARE - Administrator, Civil Service (Medical): No    Lack of Transportation (Non-Medical): No  Physical Activity: Sufficiently Active (01/08/2023)   Exercise Vital Sign    Days of Exercise per Week: 7 days    Minutes of Exercise per Session: 30 min  Stress: Stress Concern Present (01/08/2023)   [employer] of Occupational Health - Occupational Stress Questionnaire    Feeling of Stress : Rather much  Social Connections: Socially Integrated (01/08/2023)   Social Connection and Isolation Panel    Frequency of Communication with Friends and Family: Twice a week    Frequency of Social Gatherings with Friends and Family: More than three times a week    Attends Religious Services: 1 to 4 times per year    Active Member of [employer] or Organizations: Yes    Attends Banker Meetings: Never    Marital Status: Married   Catering manager Violence: Not At Risk (10/18/2023)   Humiliation, Afraid,  Rape, and Kick questionnaire    Fear of Current or Ex-Partner: No    Emotionally Abused: No    Physically Abused: No    Sexually Abused: No    Family History  Problem Relation Age of Onset   Hypertension Father    Cancer Father    Heart disease Mother    Asthma Son        had as a child   Heart disease Sister    Breast cancer Sister    Hypertension Brother     Past Surgical History:  Procedure Laterality Date   BACK SURGERY     feb 21, 23   CARPAL TUNNEL RELEASE Right 03/26/2023   Procedure: RIGHT CARPAL TUNNEL RELEASE;  Surgeon: Arlinda Buster, MD;  Location: [facility];  Service: Orthopedics;  Laterality: Right;   CARPOMETACARPEL SUSPENSION PLASTY Right 03/26/2023   Procedure: RIGHT THUMB CARPOMETACARPEL (CMC)  ARTHROPLASTY WITH INTERNAL BRACE;  Surgeon: Arlinda Buster, MD;  Location: [facility];  Service: Orthopedics;  Laterality: Right;   CHOLECYSTECTOMY     COLONOSCOPY     ENDOMETRIAL ABLATION  10/2010   HERNIA REPAIR     umbicial hernia   JOINT REPLACEMENT Left 03/14/2019   Dr. Vernetta hip   KNEE ARTHROSCOPY Left    06/07/2017 Dr. Addie of [facility]   KNEE ARTHROSCOPY Left 03/21/2020   Procedure: LEFT KNEE ARTHROSCOPY WITH PARTIAL MEDIAL MENISCECTOMY;  Surgeon: Vernetta Lonni GRADE, MD;  Location: [facility];  Service: Orthopedics;  Laterality: Left;   KNEE CLOSED REDUCTION Right 12/06/2015   Procedure: CLOSED MANIPULATION RIGHT KNEE;  Surgeon: Lynwood FORBES Better, MD;  Location[facility];  Service: Orthopedics;  Laterality: Right;   KNEE CLOSED REDUCTION Right 01/17/2016   Procedure: CLOSED MANIPULATION RIGHT KNEE;  Surgeon: Lynwood FORBES Better, MD;  Location: [facility];  Service: Orthopedics;  Laterality: Right;   KNEE JOINT MANIPULATION Right 12/06/2015   LACRIMAL TUBE INSERTION Bilateral 03/01/2019   Procedure: LACRIMAL TUBE INSERTION;  Surgeon: Jacques Sharper, MD;  Location: [facility];  Service: Ophthalmology;  Laterality: Bilateral;   LACRIMAL TUBE REMOVAL Bilateral 05/03/2019   Procedure: BILATERAL NASOLACRIMAL DUCT PROBING, IIRIGATION AND TUBE REMOVAL BOTH EYES;  Surgeon: Jacques Sharper, MD;  Location:  SURGERY CENTER;  Service: Ophthalmology;  Laterality: Bilateral;   LUMBAR LAMINECTOMY/DECOMPRESSION MICRODISCECTOMY N/A 06/03/2015   Procedure: Bilateral lateral recess decompression L2-3, L3-4, L4-5;  Surgeon: Lynwood FORBES Better, MD;  Location[facility];  Service: Orthopedics;  Laterality: N/A;   LUMBAR LAMINECTOMY/DECOMPRESSION MICRODISCECTOMY N/A 10/02/2016   Procedure: Right L5-S1 Lateral Recess Decompression  microdiscectomy;  Surgeon: Better Lynwood FORBES, MD;  Location: [facility];  Service: Orthopedics;  Laterality: N/A;   LUMBAR LAMINECTOMY/DECOMPRESSION MICRODISCECTOMY N/A 07/15/2018   Procedure: LEFT L3-4 MICRODISCECTOMY;  Surgeon: Better Lynwood FORBES, MD;  Location: [facility];  Service: Orthopedics;  Laterality: N/A;   svd      x 2   TEAR DUCT PROBING Bilateral 03/01/2019   Procedure: TEAR DUCT PROBING WITH IRRIGATION;  Surgeon: Jacques Sharper, MD;  Location[facility];  Service: Ophthalmology;  Laterality: Bilateral;   TOTAL HIP ARTHROPLASTY Left 03/14/2019   Procedure: LEFT TOTAL HIP ARTHROPLASTY ANTERIOR APPROACH;  Surgeon: Vernetta Lonni GRADE, MD;  Location[facility];  Service: Orthopedics;  Laterality: Left;   TOTAL KNEE ARTHROPLASTY Right 09/06/2015   Procedure: RIGHT TOTAL KNEE ARTHROPLASTY;  Surgeon: Lynwood FORBES Better, MD;  Location: [facility];  Service: Orthopedics;  Laterality: Right;   TOTAL  KNEE REVISION Right 05/12/2022   Procedure: RIGHT TOTAL KNEE REVISION ARTHROPLASTY;  Surgeon: Vernetta Lonni GRADE, MD;  Location: [facility];  Service: Orthopedics;  Laterality: Right;   TUBAL LIGATION     UPPER GASTROINTESTINAL ENDOSCOPY  04/28/2011    ROS: Review of Systems Negative except as stated  above  PHYSICAL EXAM: BP 134/79   Pulse 89   Temp 99 F (37.2 C) (Oral)   Resp 16   Ht 5' 6 (1.676 m)   Wt 152 lb (68.9 kg)   LMP 09/01/2010   SpO2 93%   BMI 24.53 kg/m   Physical Exam  General: well-appearing, no acute distress Skin: no jaundice, rashes, or lesions Cardiovascular: regular heart rate and rhythm, normal S1/S2, no murmurs, gallops, or rubs, peripheral pulses 2+ bilaterally Chest: no skeletal deformity, lungs clear to auscultation bilaterally, equal breath sounds bilaterally Abdomen: soft, non-distended, non-tender to palpation, no hepatomegaly, no splenomegaly, normoactive bowel sounds Musculoskeletal: strength of upper and lower extremities equal bilaterally, normal gait Extremities: no peripheral edema   ASSESSMENT AND PLAN:  There are no diagnoses linked to this encounter.   Patient was given the opportunity to ask questions.  Patient verbalized understanding of the plan and was able to repeat key elements of the plan.    No orders of the defined types were placed in this encounter.    Requested Prescriptions   Pending Prescriptions Disp Refills   benzonatate  (TESSALON ) 200 MG capsule 20 capsule 0    Sig: Take 1 capsule (200 mg total) by mouth 2 (two) times daily as needed for cough.    No follow-ups on file.  Sula Leavy Rode, PA-C

## 2023-10-28 NOTE — Telephone Encounter (Signed)
 FYI Only or Action Required?: FYI only for provider.  Patient was last seen in primary care on 09/13/2023 by Vicci Barnie NOVAK, MD.  Called Nurse Triage reporting Diarrhea.  Symptoms began about a month ago.  Interventions attempted: Other: Went to the ED and given IV fluids and IV nausea medication.  Symptoms are: gradually worsening.  Triage Disposition: See Physician Within 24 Hours, See HCP Within 4 Hours (Or PCP Triage)  Patient/caregiver understands and will follow disposition?: Yes Pt was seen in ED for nausea and vomiting and c/o shortness of breath. Per chart review, CXR was unremarkable. Pt still denies chest pain and SOB at rest, but reports productive coughing fits that lead to her vomiting and diarrhea. Pt has hx of COPD, not on home 02, has not been able to uses CPAP at night since she vomited in it last month.  No appts avail at PCP office until next month. Pt agreeable to be seen at another office. Appt scheduled at [facility] at 4p today   Copied from CRM [id number]. Topic: Clinical - Medication Question >> Oct 28, 2023 10:29 AM Travis F wrote: Reason for CRM: Patient is calling in because she is requesting some cough medication for a cough she has. Patient says she is coughing so much it's causing her hemorrhoids to hang out. Reason for Disposition  [1] SEVERE diarrhea (e.g., 7 or more times / day more than normal) AND [2] age > 60 years  [1] MILD difficulty breathing (e.g., minimal/no SOB at rest, SOB with walking) AND [2] worse than normal  Answer Assessment - Initial Assessment Questions 1. DIARRHEA SEVERITY: How bad is the diarrhea? How many more stools have you had in the past 24 hours than normal?      More than 10x   2. ONSET: When did the diarrhea begin?      3 weeks   3. STOOL DESCRIPTION:  How loose or watery is the diarrhea? What is the stool color? Is there any blood or mucous in the stool?     Watery  4. VOMITING: Are you also vomiting?  If Yes, ask: How many times in the past 24 hours?      Yes, has thrown up 1-2 times but was IV phenergan  in the ED yesterda  5. ABDOMEN PAIN: Are you having any abdomen pain? If Yes, ask: What does it feel like? (e.g., crampy, dull, intermittent, constant)      Yes, crampy pain  6. ABDOMEN PAIN SEVERITY: If present, ask: How bad is the pain?  (e.g., Scale 1-10; mild, moderate, or severe)     *No Answer* 7. ORAL INTAKE: If vomiting, Have you been able to drink liquids? How much liquids have you had in the past 24 hours?     Hesistant to drink due to nausea   8. HYDRATION: Any signs of dehydration? (e.g., dry mouth [not just dry lips], too weak to stand, dizziness, new weight loss) When did you last urinate?     Not at this time. Patient given IVF when in the ED yesterday  9. EXPOSURE: Have you traveled to a foreign country recently? Have you been exposed to anyone with diarrhea? Could you have eaten any food that was spoiled?     No  10. ANTIBIOTIC USE: Are you taking antibiotics now or have you taken antibiotics in the past 2 months?       No  11. OTHER SYMPTOMS: Do you have any other symptoms? (e.g., fever, blood in stool)  cough 12. PREGNANCY: Is there any chance you are pregnant? When was your last menstrual period?       no  Answer Assessment - Initial Assessment Questions 1. MAIN CONCERN OR SYMPTOM: What's your main concern? (e.g., low oxygen  level, breathing difficulty) What question do you have?     Has productive cough with yellow mucus, and vomiting for the past month  2.  OXYGEN  EQUIPMENT:  Are you having trouble with your oxygen  equipment?  (e.g., cannula, mask, tubing, tank, concentrator)     Threw up in CPAP machine last month  3. ONSET: When did the  coughing  start?      1 month  4. OXYGEN  THERAPY:      No  5. PULSE OXIMETER:      No  6. O2 MONITORING: What is the oxygen  level (pulse ox reading)? (e.g., 70-100%)      No  7. VITAL SIGNS MONITORING: Do you monitor/measure your oxygen  level or vital signs (e.g., yes, no, measurements are automatically sent to provider/call center). Document CURRENT and NORMAL BASELINE values if available.       No  8. BREATHING DIFFICULTY: Are you having any difficulty breathing? If Yes, ask: How bad is it?  (e.g., none, mild, moderate, severe)      Mild to moderate shortness  9. OTHER SYMPTOMS: Do you have any other symptoms? (e.g., fever, change in sputum)     Vomiting and diarrhea  10. SMOKING: Do you smoke currently? Is there anyone that smokes around you?  (Note: smoking around oxygen  is dangerous!)       no  Protocols used: Diarrhea-A-AH, COPD Oxygen  Monitoring and Hypoxia-A-AH

## 2023-10-29 ENCOUNTER — Other Ambulatory Visit: Payer: Self-pay | Admitting: Internal Medicine

## 2023-10-29 ENCOUNTER — Telehealth: Payer: Self-pay | Admitting: *Deleted

## 2023-10-29 DIAGNOSIS — R052 Subacute cough: Secondary | ICD-10-CM

## 2023-10-29 DIAGNOSIS — J449 Chronic obstructive pulmonary disease, unspecified: Secondary | ICD-10-CM

## 2023-10-29 NOTE — Telephone Encounter (Signed)
 Requested medications are due for refill today.  PT does not need albuterol  - see note belo  Requested medications are on the active medications list.  yes  Last refill. 09/13/2023 360 mL 2 rf  Future visit scheduled.   yes  Notes to clinic.  Copied from CRM #[id number]. Topic: Clinical - Medication Question >> Oct 29, 2023  9:12 AM Emylou G wrote: Reason for CRM: Patient is looking to order the mouth piece and hose for the albuteral.  She is unsure about how this is ordered?      Requested Prescriptions  Pending Prescriptions Disp Refills   albuterol  (PROVENTIL ) (2.5 MG/3ML) 0.083% nebulizer solution 360 mL 2    Sig: USE ONE VIAL FOR UP TO 3 TIMES DAILY.     Pulmonology:  Beta Agonists 2 Passed - 10/29/2023  5:25 PM      Passed - Last BP in normal range    BP Readings from Last 1 Encounters:  10/28/23 134/79         Passed - Last Heart Rate in normal range    Pulse Readings from Last 1 Encounters:  10/28/23 89         Passed - Valid encounter within last 12 months    Recent Outpatient Visits           Yesterday Nausea and vomiting, unspecified vomiting type   [facility] Leavy Lucas Cole Klugh, PA-C   1 month ago Diabetes mellitus treated with oral medication [facility])   [facility] [place] Vicci Sober B, MD   5 months ago Type 2 diabetes mellitus with diabetic polyneuropathy, without long-term current use of insulin  [facility])   [facility] [place]. [facility] Vicci Sober B, MD   9 months ago Controlled type 2 diabetes mellitus with diabetic polyneuropathy, without long-term current use of insulin  [facility])   [facility] [place]. [facility] Vicci Sober B, MD   1 year ago Controlled type 2 diabetes mellitus with diabetic polyneuropathy, without long-term current use of insulin  [facility])   [facility] [place]. [facility] Vicci Sober NOVAK, MD

## 2023-10-29 NOTE — Telephone Encounter (Signed)
 Tessalon  Perles were sent to her pharmacy yesterday by the provider who saw her.

## 2023-10-29 NOTE — Telephone Encounter (Signed)
 Noted

## 2023-10-29 NOTE — Telephone Encounter (Signed)
 Copied from CRM #[id number]. Topic: Clinical - Medication Question >> Oct 29, 2023  9:12 AM Emylou G wrote: Reason for CRM: Patient is looking to order the mouth piece and hose for the albuteral.  She is unsure about how this is ordered?

## 2023-10-29 NOTE — Telephone Encounter (Signed)
 Copied from CRM #[id number]. Topic: Clinical - Medication Refill >> Oct 29, 2023  9:09 AM Emylou G wrote: Medication: albuterol  (PROVENTIL ) (2.5 MG/3ML) 0.083% nebulizer solution  Has the patient contacted their pharmacy? No (Agent: If no, request that the patient contact the pharmacy for the refill. If patient does not wish to contact the pharmacy document the reason why and proceed with request.) (Agent: If yes, when and what did the pharmacy advise?)  This is the patient's preferred pharmacy:  [facility] - [place], [place] - [address] [place] [address] Phone: [phone] Fax: [phone]  Is this the correct pharmacy for this prescription? Yes If no, delete pharmacy and type the correct one.   Has the prescription been filled recently? No  Is the patient out of the medication? Yes  Has the patient been seen for an appointment in the last year OR does the patient have an upcoming appointment? Yes  Can we respond through MyChart? no  Agent: Please be advised that Rx refills may take up to 3 business days. We ask that you follow-up with your pharmacy.

## 2023-10-29 NOTE — Telephone Encounter (Signed)
 Copied from CRM [id number]. Topic: Clinical - Medication Question >> Oct 28, 2023 10:16 AM Maisie C wrote: Reason for CRM: pt called in because she was triaged yesterday and sent to the ER for vomiting and nausea. She stated that they did not prescribe her cough medicine and asked for Dr. Vicci to send her a rx for it.

## 2023-11-01 MED ORDER — ALBUTEROL SULFATE (2.5 MG/3ML) 0.083% IN NEBU: INHALATION_SOLUTION | RESPIRATORY_TRACT | 2 refills | Status: DC

## 2023-11-02 ENCOUNTER — Other Ambulatory Visit: Payer: Self-pay | Admitting: Internal Medicine

## 2023-11-02 DIAGNOSIS — F419 Anxiety disorder, unspecified: Secondary | ICD-10-CM

## 2023-11-03 ENCOUNTER — Encounter (HOSPITAL_COMMUNITY)
Admission: RE | Admit: 2023-11-03 | Discharge: 2023-11-03 | Disposition: A | Discharge status: Home / Self Care | Source: Ambulatory Visit | Attending: Gastroenterology | Admitting: Gastroenterology

## 2023-11-03 DIAGNOSIS — R112 Nausea with vomiting, unspecified: Secondary | ICD-10-CM | POA: Diagnosis present

## 2023-11-03 DIAGNOSIS — I10 Essential (primary) hypertension: Secondary | ICD-10-CM | POA: Diagnosis not present

## 2023-11-03 DIAGNOSIS — R32 Unspecified urinary incontinence: Secondary | ICD-10-CM | POA: Diagnosis not present

## 2023-11-03 DIAGNOSIS — R159 Full incontinence of feces: Secondary | ICD-10-CM | POA: Diagnosis not present

## 2023-11-03 MED ORDER — TECHNETIUM TC 99M SULFUR COLLOID
2.0000 | Freq: Once | INTRAVENOUS | Status: AC | PRN
Start: 2023-11-03 — End: 2023-11-03
  Administered 2023-11-03: 2.06 via ORAL

## 2023-11-03 NOTE — Telephone Encounter (Signed)
 Called but goes straight to VM. Unable to LVM due to VM being full.

## 2023-11-10 ENCOUNTER — Telehealth: Payer: Self-pay | Admitting: Internal Medicine

## 2023-11-10 DIAGNOSIS — F331 Major depressive disorder, recurrent, moderate: Secondary | ICD-10-CM | POA: Diagnosis not present

## 2023-11-10 NOTE — Telephone Encounter (Signed)
 Copied from CRM #[id number]. Topic: Clinical - Prescription Issue >> Nov 10, 2023  9:37 AM Winona SAUNDERS wrote: [facility] calling to follow up on refill request for 21 of the pts medications. Caller ended the call and I could not provide any additional steps

## 2023-11-10 NOTE — Telephone Encounter (Signed)
 Called & spoke to the patient. Verified name & DOB. Inquired if patient would like to switch her pharmacy. Patient stated that she will continue using [facility]. No further assistance needed at this time.

## 2023-11-16 DIAGNOSIS — G4733 Obstructive sleep apnea (adult) (pediatric): Secondary | ICD-10-CM | POA: Diagnosis not present

## 2023-11-17 ENCOUNTER — Telehealth: Payer: Self-pay | Admitting: Internal Medicine

## 2023-11-17 ENCOUNTER — Other Ambulatory Visit: Payer: Self-pay | Admitting: *Deleted

## 2023-11-17 ENCOUNTER — Other Ambulatory Visit: Payer: Self-pay

## 2023-11-17 DIAGNOSIS — F331 Major depressive disorder, recurrent, moderate: Secondary | ICD-10-CM | POA: Diagnosis not present

## 2023-11-17 NOTE — Telephone Encounter (Signed)
 Let pt know that I received request for Rfs on meds to be sent to [facility] pharmacy out of [place] . Please confirm that she requested this.

## 2023-11-17 NOTE — Patient Instructions (Signed)
 Visit Information  Stacy [name] was given information about Medicaid Managed Care team care coordination services as a part of their [facility] Community Plan Medicaid benefit.   If you would like to schedule transportation through your [facility], please call the following number at least 2 days in advance of your appointment: [phone]   Rides for urgent appointments can also be made after hours by calling Member Services.  Call the Behavioral Health Crisis Line at [phone], at any time, 24 hours a day, 7 days a week. If you are in danger or need immediate medical attention call 911.  Please see education materials related to HTN and Diabetes provided by MyChart link.  Patient verbalizes understanding of instructions and care plan provided today and agrees to view in MyChart. Active MyChart status and patient understanding of how to access instructions and care plan via MyChart confirmed with patient.     Telephone follow up appointment with Managed Medicaid care management team member scheduled for: 12/20/23 @ 9 am.  Aniayah Alaniz, RN, BSN, ACM RN Care Manager [employer] [phone]

## 2023-11-17 NOTE — Patient Outreach (Signed)
 Complex Care Management   Visit Note  11/17/2023  Name:  Stacy [name] [facility] MRN: [id number] DOB: [birth year]  Situation: Referral received for Complex Care Management related to HTN and Diabetes I obtained verbal consent from Patient.  Visit completed with Patient  on the phone  Background:   Past Medical History:  Diagnosis Date   Allergy     Shellfish, cleaning products   Anxiety    Arthritis    Arthrofibrosis of total knee replacement    right   Asthma    COPD (chronic obstructive pulmonary disease) (HCC)    Depression    Diabetes mellitus    Type II   GERD (gastroesophageal reflux disease)    Pt on Protonix  daily   Glaucoma    Gout    Headache(784.0)    otc meds prn   Hyperlipidemia    Hypertension    Irritable bowel syndrome 11/19/2010   Neuropathy    Pneumonia YRS AGO   Restless legs    Shortness of breath    07/14/2018- uses  4 times a day   Sleep apnea     Assessment: Patient Reported Symptoms:  Cognitive Cognitive Status: No symptoms reported, Able to follow simple commands, Alert and oriented to person, place, and time, Insightful and able to interpret abstract concepts, Normal speech and language skills Cognitive/Intellectual Conditions Management [RPT]: None reported or documented in medical history or problem list   Health Maintenance Behaviors: Annual physical exam, Sleep adequate, Spiritual practice(s), Stress management, Healthy diet, Social activities Healing Pattern: Fast Health Facilitated by: Healthy diet, Pain control, Prayer/meditation, Rest, Stress management  Neurological Neurological Review of Symptoms: No symptoms reported Neurological Management Strategies: Adequate rest, Routine screening Neurological Self-Management Outcome: 4 (good)  HEENT HEENT Symptoms Reported: No symptoms reported HEENT Management Strategies: Routine screening, Adequate rest HEENT Self-Management Outcome: 4 (good)    Cardiovascular Cardiovascular Symptoms  Reported: No symptoms reported Does patient have uncontrolled Hypertension?: Yes Is patient checking Blood Pressure at home?: Yes Patient's Recent BP reading at home: Blood Pressure is 120/80 on yesterday. Cardiovascular Management Strategies: Coping strategies, Medication therapy, Routine screening, Adequate rest, Medical device Weight: 146 lb 6.4 oz (66.4 kg) Cardiovascular Self-Management Outcome: 4 (good)  Respiratory Respiratory Symptoms Reported: No symptoms reported Other Respiratory Symptoms: Patient reports that  she needs a new mask for her CPAP.  She informs me that [employer] has ordered mask and she is awaiting delivery.  Mrs. Wagler reports no shortness of breath or wheezing. Respiratory Management Strategies: Coping strategies, Adequate rest, Routine screening Respiratory Self-Management Outcome: 4 (good)  Endocrine Endocrine Symptoms Reported: No symptoms reported Is patient diabetic?: Yes Is patient checking blood sugars at home?: Yes List most recent blood sugar readings, include date and time of day: BS-114 fasting today Endocrine Self-Management Outcome: 4 (good) Endocrine Comment: Reports highest Blood Sugar-114 and lowest 107  Gastrointestinal Gastrointestinal Symptoms Reported: Vomiting, Nausea Additional Gastrointestinal Details: Reports  nausea and vomiting is much better.  She is taking Ondansetron  as needed for nausea and vomiting.  She informs me that she is able to eat grits and eggs and is starting to add foods that she can tolerate back intor her diet. Gastrointestinal Management Strategies: Adequate rest, Activity Gastrointestinal Self-Management Outcome: 4 (good)    Genitourinary Genitourinary Symptoms Reported: Incontinence Additional Genitourinary Details: Patient reports that she wears briefs. Genitourinary Management Strategies: Adequate rest, Medication therapy, Medical device, Incontinence garment/pad Genitourinary Self-Management Outcome: 4 (good)   Integumentary Integumentary Symptoms Reported: Sweating Other Integumentary Symptoms: Patient reports that  she has excessive sweating. Skin Management Strategies: Routine screening, Adequate rest Skin Self-Management Outcome: 4 (good) Skin Comment: Patient reports that she has a mass to her left arm that Orthopedics is following.  Musculoskeletal Musculoskelatal Symptoms Reviewed: Back pain, Other Additional Musculoskeletal Details: Patient reports that she has Carpal tunnel sydrome in her wrist bilaterally. Continues to have numbness to her left wrist area.  Patient reports that she will be having surgery on her left wrist on 12/02/23.  Patient is followed by Orthopedics. Musculoskeletal Management Strategies: Routine screening, Medication therapy Musculoskeletal Self-Management Outcome: 3 (uncertain) Falls in the past year?: Yes (Patient reports no falls since last outreach.) Number of falls in past year: 2 or more Was there an injury with Fall?: No Fall Risk Category Calculator: 2 Patient Fall Risk Level: Moderate Fall Risk Patient at Risk for Falls Due to: History of fall(s), Impaired balance/gait, Impaired mobility Fall risk Follow up: Falls evaluation completed, Education provided, Falls prevention discussed  Psychosocial Psychosocial Symptoms Reported: Anxiety - if selected complete GAD Additional Psychological Details: Patient reports that anxiety is doing much beter.  She reports that she uses hydroxyzine  as needed to help with anxiety.  Patient reports that she has couseling every Wednesday with her therapist. Behavioral Management Strategies: Medication therapy, Coping strategies, Support system Behavioral Health Self-Management Outcome: 4 (good) Major Change/Loss/Stressor/Fears (CP): Denies Techniques to Cope with Loss/Stress/Change: Counseling, Diversional activities, Medication Quality of Family Relationships: helpful, involved, supportive Do you feel physically threatened by  others?: No    11/17/2023    PHQ2-9 Depression Screening   Little interest or pleasure in doing things Not at all  Feeling down, depressed, or hopeless Not at all  PHQ-2 - Total Score 0  Trouble falling or staying asleep, or sleeping too much    Feeling tired or having little energy    Poor appetite or overeating     Feeling bad about yourself - or that you are a failure or have let yourself or your family down    Trouble concentrating on things, such as reading the newspaper or watching television    Moving or speaking so slowly that other people could have noticed.  Or the opposite - being so fidgety or restless that you have been moving around a lot more than usual    Thoughts that you would be better off dead, or hurting yourself in some way    PHQ2-9 Total Score    If you checked off any problems, how difficult have these problems made it for you to do your work, take care of things at home, or get along with other people    Depression Interventions/Treatment      Vitals:   11/16/23 1054  BP: 120/80  Pulse: 70    Medications Reviewed Today     Reviewed by Jorja Nichole LABOR, RN (Case Manager) on 11/17/23 at 1027  Med List Status: <None>   Medication Order Taking? Sig Documenting Provider Last Dose Status Informant  Accu-Chek Softclix Lancets lancets [id number] Yes USE AS INSTRUCTED 3 TIMES A DAY Vicci Barnie NOVAK, MD  Active   albuterol  (PROVENTIL ) (2.5 MG/3ML) 0.083% nebulizer solution [id number] Yes USE ONE VIAL FOR UP TO 3 TIMES DAILY. Vicci Barnie NOVAK, MD  Active   allopurinol  (ZYLOPRIM ) 100 MG tablet [id number] Yes TAKE 1 TABLET (100 MG TOTAL) BY MOUTH DAILY.(AM) Vicci Barnie NOVAK, MD  Active   benzonatate  (TESSALON ) 200 MG capsule [id number] Yes Take 1 capsule (200 mg total) by mouth 2 (two) times daily  as needed for cough. Leavy Lucas Fox, PA-C  Active   Blood Glucose Monitoring Suppl (ACCU-CHEK AVIVA PLUS) w/Device KIT [id number] Yes UAD to check blood sugars.  Vicci Barnie NOVAK, MD  Active   celecoxib  (CELEBREX ) 200 MG capsule [id number] Yes Take 1 capsule (200 mg total) by mouth daily. Vicci Barnie NOVAK, MD  Active   clopidogrel  (PLAVIX ) 75 MG tablet [id number] Yes TAKE 1 TABLET (75 MG TOTAL) BY MOUTH DAILY. (AM) Vicci Barnie NOVAK, MD  Active   diclofenac  Sodium (VOLTAREN ) 1 % GEL [id number] Yes Apply 2 g topically 4 (four) times daily. Gretta Bertrum ORN, PA-C  Active   DULoxetine  (CYMBALTA ) 60 MG capsule [id number] Yes TAKE 1 CAPSULE (60 MG TOTAL) BY MOUTH DAILY (AM) Vicci Barnie NOVAK, MD  Active   EPINEPHrine  0.3 mg/0.3 mL IJ SOAJ injection [id number] Yes 0.3 mg IM x 1 PRN for allergic reaction Vicci Barnie NOVAK, MD  Active   FARXIGA  10 MG TABS tablet [id number] Yes TAKE 1 TABLET (10 MG TOTAL) BY MOUTH DAILY BEFORE BREAKFAST. Vicci Barnie NOVAK, MD  Active   [name] 325 (65 Fe) MG tablet [id number] Yes TAKE ONE TABLET ( 325 MG ) BY MOUTH DAILY (AM) Vicci Barnie NOVAK, MD  Active   fluticasone  (FLONASE ) 50 MCG/ACT nasal spray [id number] Yes PLACE 1 SPRAY INTO BOTH NOSTRILS DAILY. Vicci Barnie NOVAK, MD  Active     Discontinued 04/16/11 0943 glucose blood (ACCU-CHEK AVIVA PLUS) test strip [id number] Yes USE AS INSTRUCTED 3 TIMES A DAY. E11.9, Z79.84 Vicci Barnie NOVAK, MD  Active   glucose chewable tablet 12 g [id number]   Vicci Barnie NOVAK, MD  Active   glycopyrrolate  (ROBINUL ) 2 MG tablet [id number] Yes Take 2 mg by mouth 3 (three) times daily. [provider]  Active Self, Pharmacy Records  GVOKE PFS  1 MG/0.2ML SOSY [id number] Yes USE ONE SYRINGE (1 MG) UNDER THE SKIN AS NEEDED FOR SEVERE SYMPTOMATIC LOW BLOOD SUGAR Vicci Barnie NOVAK, MD  Active   hydrOXYzine  (ATARAX ) 10 MG tablet [id number] Yes TAKE 1 TABLET IN THE MORNING, THEN 1 TABLET AT NOON AND 2 TABLETS IN THE EVENING AS NEEDED. Vicci Barnie NOVAK, MD  Active   Lancets (ACCU-CHEK SOFT [place]) lancets [id number] Yes Use as instructed Danton Jon HERO, PA-C  Active Self, Pharmacy Records   Patient  not taking:   Discontinued 04/16/11 0916 (Error) loratadine  (CLARITIN ) 10 MG tablet [id number] Yes TAKE 1 TABLET (10 MG TOTAL) BY MOUTH DAILY AS NEEDED FOR ALLERGIES. (AM) Vicci Barnie NOVAK, MD  Active   melatonin 5 MG TABS [id number] Yes Take 5 mg by mouth at bedtime. [provider]  Active Self, Pharmacy Records  metFORMIN  (GLUCOPHAGE -XR) 750 MG 24 hr tablet [id number] Yes TAKE 1 TABLET (750 MG TOTAL) BY MOUTH DAILY WITH BREAKFAST. Vicci Barnie NOVAK, MD  Active   methocarbamol  (ROBAXIN ) 500 MG tablet [id number] Yes TAKE 1 TABLET (500 MG TOTAL) BY MOUTH EVERY 6 (SIX) HOURS AS NEEDED FOR MUSCLE SPASMS. Gretta Bertrum ORN, PA-C  Active   Methylnaltrexone  Bromide 8 MG/0.4ML SOLN [id number] Yes Take 0.4 mLs by mouth as needed. Take once daily as needed Rogelia Jerilynn RAMAN, MD  Active   montelukast  (SINGULAIR ) 10 MG tablet [id number] Yes Take 10 mg by mouth daily. [provider]  Active Self, Pharmacy Records  Multiple Vitamins-Minerals (CENTRUM SILVER 50+WOMEN PO) [id number] Yes Take 1 tablet by mouth daily. [provider]  Active Self, Pharmacy Records  ondansetron  (ZOFRAN -ODT) 4 MG disintegrating tablet [id number] Yes Take 1 tablet (4  mg total) by mouth every 8 (eight) hours as needed for nausea or vomiting. Leavy Lucas Fox, PA-C  Active   pantoprazole  (PROTONIX ) 40 MG tablet [id number] Yes Take 40 mg by mouth daily. [provider]  Active Self, Pharmacy Records  potassium chloride  SA (KLOR-CON  M) 20 MEQ tablet [id number] Yes TAKE 1 TABLET (20 MEQ TOTAL) BY MOUTH DAILY.(NOON ) Vicci Barnie NOVAK, MD  Active   Respiratory Therapy Supplies (FLUTTER) DEVI [id number] Yes Use after breathing treatment 4 times daily Brien Belvie BRAVO, MD  Active Self, Pharmacy Records  rosuvastatin  (CRESTOR ) 20 MG tablet [id number] Yes Take 1 tablet (20 mg total) by mouth daily. Stop Atorvastatin  Vicci Barnie NOVAK, MD  Active   SPIRIVA  HANDIHALER 18 MCG inhalation capsule [id number] Yes Place 1  capsule (18 mcg total) into inhaler and inhale daily. Vicci Barnie NOVAK, MD  Active   SYMBICORT  160-4.5 MCG/ACT inhaler [id number] Yes Inhale 1 puff into the lungs 2 (two) times daily. [provider]  Active Self, Pharmacy Records  triamcinolone  cream (KENALOG ) 0.1 % [id number] Yes Apply 1 Application topically 2 (two) times daily as needed (irritation). Danton Slough M, PA-C  Active   valsartan -hydrochlorothiazide  (DIOVAN -HCT) 160-12.5 MG tablet [id number] Yes TAKE ONE TABLET BY MOUTH ONCE DAILY (AM) Vicci Barnie NOVAK, MD  Active             Recommendation:   PCP Follow-up Specialty provider follow-up- [facility];  PCP- 01/13/24; Podiatry-11/30/23; Orthopedics- 12/02/23 and 12/16/2023 Continue Current Plan of Continue Current Plan of Care  Follow Up Plan:   Telephone follow-up in 1 month: 12/20/23 @ 9 am  Syair Fricker, RN, Scientist, research (physical sciences), Theatre manager [employer] [phone]

## 2023-11-18 ENCOUNTER — Other Ambulatory Visit: Payer: Self-pay

## 2023-11-18 DIAGNOSIS — G5602 Carpal tunnel syndrome, left upper limb: Secondary | ICD-10-CM

## 2023-11-18 NOTE — Telephone Encounter (Signed)
 Called & spoke to the patient. Verified name & DOB. Patient has declined to use [facility]. She will continue to use [facility].

## 2023-11-24 DIAGNOSIS — F331 Major depressive disorder, recurrent, moderate: Secondary | ICD-10-CM | POA: Diagnosis not present

## 2023-11-30 ENCOUNTER — Ambulatory Visit: Admitting: Podiatry

## 2023-11-30 ENCOUNTER — Encounter: Payer: Self-pay | Admitting: Podiatry

## 2023-11-30 DIAGNOSIS — M1A9XX0 Chronic gout, unspecified, without tophus (tophi): Secondary | ICD-10-CM | POA: Insufficient documentation

## 2023-11-30 DIAGNOSIS — M79676 Pain in unspecified toe(s): Secondary | ICD-10-CM | POA: Diagnosis not present

## 2023-11-30 DIAGNOSIS — K3184 Gastroparesis: Secondary | ICD-10-CM | POA: Insufficient documentation

## 2023-11-30 DIAGNOSIS — E1142 Type 2 diabetes mellitus with diabetic polyneuropathy: Secondary | ICD-10-CM | POA: Diagnosis not present

## 2023-11-30 DIAGNOSIS — K5730 Diverticulosis of large intestine without perforation or abscess without bleeding: Secondary | ICD-10-CM | POA: Insufficient documentation

## 2023-11-30 DIAGNOSIS — K2100 Gastro-esophageal reflux disease with esophagitis, without bleeding: Secondary | ICD-10-CM | POA: Insufficient documentation

## 2023-11-30 DIAGNOSIS — B351 Tinea unguium: Secondary | ICD-10-CM

## 2023-11-30 DIAGNOSIS — R197 Diarrhea, unspecified: Secondary | ICD-10-CM | POA: Insufficient documentation

## 2023-11-30 DIAGNOSIS — R1084 Generalized abdominal pain: Secondary | ICD-10-CM | POA: Insufficient documentation

## 2023-11-30 DIAGNOSIS — A045 Campylobacter enteritis: Secondary | ICD-10-CM | POA: Insufficient documentation

## 2023-11-30 DIAGNOSIS — K5900 Constipation, unspecified: Secondary | ICD-10-CM | POA: Insufficient documentation

## 2023-11-30 DIAGNOSIS — R112 Nausea with vomiting, unspecified: Secondary | ICD-10-CM | POA: Insufficient documentation

## 2023-12-01 ENCOUNTER — Other Ambulatory Visit: Payer: Self-pay | Admitting: Orthopedic Surgery

## 2023-12-01 DIAGNOSIS — F331 Major depressive disorder, recurrent, moderate: Secondary | ICD-10-CM | POA: Diagnosis not present

## 2023-12-01 MED ORDER — OXYCODONE HCL 5 MG PO TABS: 5.0000 mg | ORAL_TABLET | Freq: Four times a day (QID) | ORAL | 0 refills | Status: DC | PRN

## 2023-12-02 DIAGNOSIS — G5601 Carpal tunnel syndrome, right upper limb: Secondary | ICD-10-CM | POA: Diagnosis not present

## 2023-12-03 ENCOUNTER — Telehealth: Payer: Self-pay

## 2023-12-03 ENCOUNTER — Other Ambulatory Visit: Payer: Self-pay | Admitting: Internal Medicine

## 2023-12-03 NOTE — Telephone Encounter (Signed)
 Patient called and would like to know if she can get a sling.  Stated that she is tired of holding her arm up.  CB# [phone].  Please advise.  Thank you.

## 2023-12-03 NOTE — Telephone Encounter (Signed)
Talked with patient and advised her of message below. 

## 2023-12-04 DIAGNOSIS — R159 Full incontinence of feces: Secondary | ICD-10-CM | POA: Diagnosis not present

## 2023-12-04 DIAGNOSIS — R32 Unspecified urinary incontinence: Secondary | ICD-10-CM | POA: Diagnosis not present

## 2023-12-04 DIAGNOSIS — I10 Essential (primary) hypertension: Secondary | ICD-10-CM | POA: Diagnosis not present

## 2023-12-05 NOTE — Progress Notes (Signed)
  Subjective:  Patient ID: Stacy [name], female    DOB: Aug 11, 1960,  MRN: [id number]  Stacy [name] presents to clinic today for at risk foot care with history of diabetic neuropathy and painful mycotic toenails x 10 which interfere with daily activities. Pain is relieved with periodic professional debridement.  Chief Complaint  Patient presents with   Diabetes    DFC NIDDM A1C 7.0 Toenail trim and callus care. LOV with PCP 09/13/23.   New problem(s): None.   PCP is Stacy Barnie NOVAK, MD.  Allergies  Allergen Reactions   Other Shortness Of Breath    UNSPECIFIED AGENTS Allergic to perfumes and cleaning products   Shellfish Allergy  Anaphylaxis    Per allergy  test.   Ace Inhibitors Cough and Other (See Comments)        Celebrex  [Celecoxib ]      upset stomach  Pt is taking med*   Shellfish Protein-Containing Drug Products Other (See Comments)   Aspirin  Nausea Only and Other (See Comments)    stomach upset  Other reaction(s): Nausea Only    Review of Systems: Negative except as noted in the HPI.  Objective: No changes noted in today's physical examination. There were no vitals filed for this visit. Stacy [name] is a pleasant 63 y.o. female WD, WN in NAD. AAO x 3.  Vascular Examination: CFT less than 3 seconds. DP pulses palpable b/l. PT pulses nonpalpable b/l. Digital hair absent. Skin temperature gradient warm to warn b/l. No ischemia or gangrene. No cyanosis or clubbing noted b/l. No edema noted b/l LE.   Neurological Examination: Sensation grossly intact b/l with 10 gram monofilament. Vibratory sensation intact b/l. Pt has subjective symptoms of neuropathy.  Dermatological Examination: Pedal skin thin, shiny and atrophic b/l. No open wounds. No interdigital macerations.   Toenails 1-5 b/l thick, discolored, elongated with subungual debris and pain on dorsal palpation.   No corns, calluses nor porokeratotic lesions noted.  Musculoskeletal  Examination: Normal muscle strength 5/5 to all lower extremity muscle groups bilaterally. HAV with bunion bilaterally and hammertoes 2-5 b/l.[name] No pain, crepitus or joint limitation noted with ROM b/l LE.  Patient ambulates independently without assistive aids.  Radiographs: None  Assessment/Plan: 1. Pain due to onychomycosis of toenail   2. Diabetic peripheral neuropathy associated with type 2 diabetes mellitus [facility])   Consent given for treatment. Patient examined. All patient's and/or POA's questions/concerns addressed on today's visit. Toenails 1-5 b/l debrided in length and girth without incident. Continue foot and shoe inspections daily. Monitor blood glucose per PCP/Endocrinologist's recommendations. Continue soft, supportive shoe gear daily. Report any pedal injuries to medical professional. Call office if there are any questions/concerns.  Return in about 3 months (around 03/01/2024).  Stacy [name], DPM      [place] LOCATION: [address], [place] [address]                   Office [phone]   [facility] LOCATION: [address] [place], [place] [address] Office [phone]

## 2023-12-06 ENCOUNTER — Encounter: Payer: Self-pay | Admitting: Radiology

## 2023-12-08 DIAGNOSIS — F331 Major depressive disorder, recurrent, moderate: Secondary | ICD-10-CM | POA: Diagnosis not present

## 2023-12-10 ENCOUNTER — Telehealth: Payer: Self-pay

## 2023-12-10 NOTE — Telephone Encounter (Signed)
 Copied from CRM #[id number]. Topic: Clinical - Medical Advice >> Dec 10, 2023  4:07 PM Kevelyn M wrote: Reason for CRM: [facility] calling in because they sent a fax on 11/10/2023 with 20 medications that need to be refilled. Please let them know which ones are active or non-active. They are the new pharmacy.  Call back # [phone] Fax# [phone]

## 2023-12-13 NOTE — Telephone Encounter (Signed)
 Per telephone encounter dated 11/17/2023. Patient declined to use [facility] at this time. No further assistance needed at this time.

## 2023-12-15 DIAGNOSIS — F331 Major depressive disorder, recurrent, moderate: Secondary | ICD-10-CM | POA: Diagnosis not present

## 2023-12-15 NOTE — Progress Notes (Signed)
   Vianny Schraeder [place] - 63 y.o. female MRN [id number]  Date of birth: 13-Jun-1960  Office Visit Note: Visit Date: 12/16/2023 PCP: Vicci Barnie NOVAK, MD Referred by: Vicci Barnie NOVAK, MD  Subjective:  HPI: Shulamis Wenberg Yeley is a 63 y.o. female who presents today for follow up 2 weeks status post left wrist open carpal tunnel release.  Doing well overall, pain is controlled.  Pertinent ROS were reviewed with the patient and found to be negative unless otherwise specified above in HPI.   Assessment & Plan: Visit Diagnoses:  1. S/P carpal tunnel release     Plan: Sutures removed today, wound is well-healing.  There is some slight gapping at the superficial surface however there is appropriate granulation tissue beneath.  Steri-Strips were applied today.  Wound will continue to heal with secondary intention.  She can begin OT as scheduled.  Follow-up with myself in 1 month.  Follow-up: No follow-ups on file.   Meds & Orders: No orders of the defined types were placed in this encounter.  No orders of the defined types were placed in this encounter.    Procedures: No procedures performed       Objective:   Vital Signs: LMP 09/01/2010   Ortho Exam Left hand: - Well-healing palmar incision, sutures removed, no erythema or drainage - Composite fist without restriction - Sensation intact to light touch in median nerve distribution - 5/5 APB without thenar atrophy - Thumb opposition well-preserved   Imaging: No results found.   Bently Wyss Afton Alderton, M.D. [facility], Hand Surgery

## 2023-12-16 ENCOUNTER — Ambulatory Visit (INDEPENDENT_AMBULATORY_CARE_PROVIDER_SITE_OTHER): Admitting: Orthopedic Surgery

## 2023-12-16 DIAGNOSIS — Z9889 Other specified postprocedural states: Secondary | ICD-10-CM

## 2023-12-16 NOTE — Therapy (Signed)
 OUTPATIENT OCCUPATIONAL THERAPY ORTHO EVALUATION  Patient Name: Stacy [name] [facility] MRN: [id number] DOB:[birth year], 63 y.o., female Today's Date: 12/17/2023  PCP: Vicci Barnie NOVAK, MD REFERRING PROVIDER: Arlinda Buster, MD  END OF SESSION:  OT End of Session - 12/17/23 1053     Visit Number 1    Number of Visits 9   including eval   Date for Recertification  02/18/24    Authorization Type [employer]-no auth required; 60 visit limit PT/OT combined, received 14 sessions including eval last POC 04/02/23-06/15/23    OT Start Time 0800    OT Stop Time 0845    OT Time Calculation (min) 45 min    Activity Tolerance Patient tolerated treatment well    Behavior During Therapy WFL for tasks assessed/performed          Past Medical History:  Diagnosis Date   Allergy     Shellfish, cleaning products   Anxiety    Arthritis    Arthrofibrosis of total knee replacement    right   Asthma    COPD (chronic obstructive pulmonary disease) (HCC)    Depression    Diabetes mellitus    Type II   GERD (gastroesophageal reflux disease)    Pt on Protonix  daily   Glaucoma    Gout    Headache(784.0)    otc meds prn   Hyperlipidemia    Hypertension    Irritable bowel syndrome 11/19/2010   Nausea and vomiting 11/30/2023   Neuropathy    Pneumonia YRS AGO   Restless legs    Shortness of breath    07/14/2018- uses  4 times a day   Sleep apnea    Past Surgical History:  Procedure Laterality Date   BACK SURGERY     feb 21, 23   CARPAL TUNNEL RELEASE Right 03/26/2023   Procedure: RIGHT CARPAL TUNNEL RELEASE;  Surgeon: Arlinda Buster, MD;  Location: [facility];  Service: Orthopedics;  Laterality: Right;   CARPOMETACARPEL SUSPENSION PLASTY Right 03/26/2023   Procedure: RIGHT THUMB CARPOMETACARPEL (CMC)  ARTHROPLASTY WITH INTERNAL BRACE;  Surgeon: Arlinda Buster, MD;  Location: [facility];  Service: Orthopedics;  Laterality: Right;   CHOLECYSTECTOMY      COLONOSCOPY     ENDOMETRIAL ABLATION  10/2010   HERNIA REPAIR     umbicial hernia   JOINT REPLACEMENT Left 03/14/2019   Dr. Vernetta hip   KNEE ARTHROSCOPY Left    06/07/2017 Dr. Addie of [facility]   KNEE ARTHROSCOPY Left 03/21/2020   Procedure: LEFT KNEE ARTHROSCOPY WITH PARTIAL MEDIAL MENISCECTOMY;  Surgeon: Vernetta Lonni GRADE, MD;  Location: [facility];  Service: Orthopedics;  Laterality: Left;   KNEE CLOSED REDUCTION Right 12/06/2015   Procedure: CLOSED MANIPULATION RIGHT KNEE;  Surgeon: Lynwood FORBES Better, MD;  Location: [facility];  Service: Orthopedics;  Laterality: Right;   KNEE CLOSED REDUCTION Right 01/17/2016   Procedure: CLOSED MANIPULATION RIGHT KNEE;  Surgeon: Lynwood FORBES Better, MD;  Location: [facility];  Service: Orthopedics;  Laterality: Right;   KNEE JOINT MANIPULATION Right 12/06/2015   LACRIMAL TUBE INSERTION Bilateral 03/01/2019   Procedure: LACRIMAL TUBE INSERTION;  Surgeon: Jacques Sharper, MD;  Location: [facility];  Service: Ophthalmology;  Laterality: Bilateral;   LACRIMAL TUBE REMOVAL Bilateral 05/03/2019   Procedure: BILATERAL NASOLACRIMAL DUCT PROBING, IIRIGATION AND TUBE REMOVAL BOTH EYES;  Surgeon: Jacques Sharper, MD;  Location: [facility];  Service: Ophthalmology;  Laterality: Bilateral;   LUMBAR LAMINECTOMY/DECOMPRESSION MICRODISCECTOMY N/A 06/03/2015  Procedure: Bilateral lateral recess decompression L2-3, L3-4, L4-5;  Surgeon: Lynwood FORBES Better, MD;  Location: [facility];  Service: Orthopedics;  Laterality: N/A;   LUMBAR LAMINECTOMY/DECOMPRESSION MICRODISCECTOMY N/A 10/02/2016   Procedure: Right L5-S1 Lateral Recess Decompression  microdiscectomy;  Surgeon: Better Lynwood FORBES, MD;  Location: [facility] OR;  Service: Orthopedics;  Laterality: N/A;   LUMBAR LAMINECTOMY/DECOMPRESSION MICRODISCECTOMY N/A 07/15/2018   Procedure: LEFT L3-4 MICRODISCECTOMY;  Surgeon: Better Lynwood FORBES, MD;  Location: [facility] OR;  Service: Orthopedics;  Laterality: N/A;    svd      x 2   TEAR DUCT PROBING Bilateral 03/01/2019   Procedure: TEAR DUCT PROBING WITH IRRIGATION;  Surgeon: Jacques Sharper, MD;  Location: [facility];  Service: Ophthalmology;  Laterality: Bilateral;   TOTAL HIP ARTHROPLASTY Left 03/14/2019   Procedure: LEFT TOTAL HIP ARTHROPLASTY ANTERIOR APPROACH;  Surgeon: Vernetta Lonni GRADE, MD;  Location: [facility];  Service: Orthopedics;  Laterality: Left;   TOTAL KNEE ARTHROPLASTY Right 09/06/2015   Procedure: RIGHT TOTAL KNEE ARTHROPLASTY;  Surgeon: Lynwood FORBES Better, MD;  Location: [facility];  Service: Orthopedics;  Laterality: Right;   TOTAL KNEE REVISION Right 05/12/2022   Procedure: RIGHT TOTAL KNEE REVISION ARTHROPLASTY;  Surgeon: Vernetta Lonni GRADE, MD;  Location: [facility];  Service: Orthopedics;  Laterality: Right;   TUBAL LIGATION     UPPER GASTROINTESTINAL ENDOSCOPY  04/28/2011   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 tophus 11/30/2023   Constipation 11/30/2023   Diarrhea 11/30/2023   Diverticular disease of colon 11/30/2023   Enteric campylobacteriosis 11/30/2023   Gastroesophageal reflux disease with esophagitis 11/30/2023   Gastroparesis 11/30/2023   Generalized abdominal pain 11/30/2023   Nausea and vomiting 11/30/2023   Arthritis of carpometacarpal [facility]) joint of right thumb 03/26/2023   Carpal tunnel syndrome, right upper limb 03/26/2023   Status post revision of total replacement of right knee 05/12/2022   History of colonic polyps 03/20/2022   Failed total knee, right, subsequent encounter 03/02/2022   Loose right total knee arthroplasty 03/02/2022   Mixed stress and urge urinary incontinence 12/04/2021   Functional fecal incontinence 12/04/2021   IBS (irritable bowel syndrome) 11/19/2021   Spondylolisthesis, lumbar region    Other spondylosis with radiculopathy, lumbar region    Other secondary scoliosis, lumbar region    Fusion of spine of lumbar region 03/25/2021    Colon polyps 06/06/2020   Lupus 06/06/2020   Allergic rhinitis due to animal (cat) (dog) hair and dander 04/08/2020   Allergic rhinitis due to pollen 04/08/2020   Food allergy  04/08/2020   Acute medial meniscus tear, left, subsequent encounter 03/21/2020   Chronic pain of left knee 02/15/2020   Paresthesia of skin 11/16/2019   History of total knee replacement, right 11/16/2019   Tobacco abuse 11/16/2019   Centrilobular emphysema (HCC) 08/10/2019   Incidental lung nodule, > 3mm and < 8mm 08/10/2019   OSA on CPAP 08/10/2019   Dyspnea on exertion 08/02/2019   Hyperlipidemia 08/02/2019   Lumbar radiculopathy 04/24/2019   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   Post laminectomy syndrome 02/23/2019   Abnormality of gait 02/23/2019   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   Unilateral primary osteoarthritis, left hip 12/28/2018   Lesion of skin of left ear 12/26/2018   Primary osteoarthritis of left hip 12/02/2018   Iron  deficiency anemia 10/16/2018   Chronic pain syndrome 09/08/2018   Chronic pain of right knee 08/11/2018   Status post lumbar laminectomy 07/15/2018  Peripheral arterial disease 04/05/2018   Moderate persistent asthma without complication 06/29/2017   Environmental and seasonal allergies 06/29/2017   Controlled type 2 diabetes mellitus with diabetic polyneuropathy, without long-term current use of insulin  (HCC) 06/29/2017   Perennial allergic rhinitis 04/08/2017   Sensorineural hearing loss (SNHL), bilateral 04/08/2017   Chronic pansinusitis 03/25/2017   Eustachian tube dysfunction, bilateral 03/25/2017   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   Herniation of lumbar intervertebral disc with radiculopathy 10/02/2016    Class: Chronic   Alopecia areata 08/19/2016   Chondromalacia of both patellae 06/03/2015    Class: Chronic   Spinal stenosis, lumbar region, with neurogenic claudication 06/03/2015   Tobacco use disorder 04/25/2015   DJD (degenerative joint disease) of  knee 01/04/2015   Hemorrhoid 11/14/2014   Gout of big toe 07/19/2014   Essential hypertension 08/14/2013   Gastroesophageal reflux disease without esophagitis 08/14/2013   COPD (chronic obstructive pulmonary disease) (HCC) 04/17/2011    ONSET DATE: 11/18/2023  REFERRING DIAG: G56.02 (ICD-10-CM) - Carpal tunnel syndrome, left upper limb  Add'l note per Dr. Erwin: Needs OT 2 weeks s/p left CTR on 12/02/23 NO SPLINT  THERAPY DIAG:  Carpal tunnel syndrome, left upper limb  Muscle weakness (generalized)  Pain in left hand  Pain in left wrist  Rationale for Evaluation and Treatment: Rehabilitation  SUBJECTIVE:   SUBJECTIVE STATEMENT: Pt reports the stitches were taken out yesterday, presented with steri-strips on surgical area. Pt reports that she is to wait until they fall off themselves. Pt accompanied by: self  PERTINENT HISTORY: DM2 with neuropathy, previous CTR 02/25 on RUE, HLD, OA, R TKA, emphysema, lumbar fusion, IBS, incontinence of urine, lung nodule, lumbar radiculopathy, spinal stenosis, COPD, gout  PRECAUTIONS: None  RED FLAGS: None   WEIGHT BEARING RESTRICTIONS: No  PAIN:  Are you having pain? Yes: NPRS scale: 6/10 Pain location: L hand/wrist Pain description: pt reports it comes and goes  Aggravating factors: movement Relieving factors: Rest, medication  FALLS: Has patient fallen in last 6 months? Yes. Number of falls 3, no injuries   LIVING ENVIRONMENT: Lives with: lives alone Lives in: House/apartment Apartment Stairs: Yes: Internal: 13 steps; on left going up and External: 2 steps; on left going up Has following equipment at home: Walker - 4 wheeled and shower chair with a back  PLOF: Independent  PATIENT GOALS: I want to get back to normal and get my hand healed up. I want to build the strength in this hand, and be able to do my daily tasks with this hand.   NEXT MD VISIT: 01/12/24  OBJECTIVE:  Note: Objective measures were completed at  Evaluation unless otherwise noted.  HAND DOMINANCE: Left  ADLs: WFL  FUNCTIONAL OUTCOME MEASURES: Quick Dash: 68.2/100  UPPER EXTREMITY ROM:     Active ROM Right eval Left eval  Shoulder flexion    Shoulder abduction    Shoulder adduction    Shoulder extension    Shoulder internal rotation    Shoulder external rotation    Elbow flexion    Elbow extension    Wrist flexion    Wrist extension    Wrist ulnar deviation    Wrist radial deviation    Wrist pronation    Wrist supination    (Blank rows = not tested)  Active ROM Right eval Left eval  Thumb MCP (0-60)    Thumb IP (0-80)    Thumb Radial abd/add (0-55)     Thumb Palmar abd/add (0-45)     Thumb Opposition  to Small Finger     Index MCP (0-90)     Index PIP (0-100)     Index DIP (0-70)      Long MCP (0-90)      Long PIP (0-100)      Long DIP (0-70)      Ring MCP (0-90)      Ring PIP (0-100)      Ring DIP (0-70)      Little MCP (0-90)      Little PIP (0-100)      Little DIP (0-70)      (Blank rows = not tested)   UPPER EXTREMITY MMT:     MMT Right eval Left eval  Shoulder flexion    Shoulder abduction    Shoulder adduction    Shoulder extension    Shoulder internal rotation    Shoulder external rotation    Middle trapezius    Lower trapezius    Elbow flexion    Elbow extension    Wrist flexion    Wrist extension    Wrist ulnar deviation    Wrist radial deviation    Wrist pronation    Wrist supination    (Blank rows = not tested)  HAND FUNCTION: Grip strength: Right: 61 lbs; Left: NT lbs  COORDINATION: (pt's nails were long and untrimmed at time of testing) 9 Hole Peg test: Right: 41.46 sec; Left: 33.50 sec Box and Blocks:  Right 49 blocks, Left 54 blocks  SENSATION: WFL  EDEMA: Pt reports it was swollen before, but is elevating it whenever possible.   COGNITION: Overall cognitive status: Within functional limits for tasks assessed   OBSERVATIONS: Pain in L hand and wrist,  healing surgical area, affecting ability to complete ADL/IADL   TREATMENT DATE: 12/17/23                                                                                                                            Pt educated in purpose of OT, POC, and goals. Educated pt in stretches to maintain good ROM in wrist and hand, see Pt instructions for IHP HEP issued. Pt requested a covering for surgical area, stockinette cut and provided with pt stating that feels a lot better. Educated pt in sleep positioning, see Pt instructions for handout provided. Pt reports that she had had difficulty with sleeping before surgery/CTS but agreed to attempt.    PATIENT EDUCATION: Education details: SEE ABOVE Person educated: Patient Education method: Explanation, Demonstration, and Handouts Education comprehension: verbalized understanding, returned demonstration, and needs further education  HOME EXERCISE PROGRAM: 12/17/23: IHP HEP s/p CTR, sleep positioning  GOALS: Goals reviewed with patient? Yes  SHORT TERM GOALS: Target date: 01/16/24  Pt will be independent with HEP for wrist strengthening and hand strengthening Baseline: New to OP OT Goal status: INITIAL  2.  Pt will be independent with scar tissue massage for L hand/wrist Baseline: New to OP OT Goal status: INITIAL  3.  Pt will verbalize at least 2 joint protection strategies to reduce re-inflammation of L wrist and hand Baseline: New to OP OT Goal status: INITIAL  4.  Pt will report reduced pain in ADL completion to no more than 2/10  Baseline: 6/10 Goal status: INITIAL    LONG TERM GOALS: Target date: 02/18/24  Patient will demonstrate at least 25% improvement with quick Dash score (reporting 43.2% disability or less) indicating improved functional use of affected extremity.  Baseline: Quick Dash: 68.2/100 Goal status: INITIAL  2.  OT will assess grip strength of L hand and make goal as appropriate. Baseline: New to OP OT Goal  status: INITIAL  3.  Pt will be educated in AE/compnsatory strategies to promote ease and reduced pain in ADL/IADL completion Baseline: New to OP OT Goal status: INITIAL    ASSESSMENT:  CLINICAL IMPRESSION: Patient is a 63 y.o. female who was seen today for occupational therapy evaluation for s/p L CTR. Hx includes R CTR, COPD, DM2, and neuropathy. Patient currently presents below baseline level of functioning demonstrating functional deficits and impairments as noted below. Pt would benefit from skilled OT services in the outpatient setting to work on impairments as noted below to help pt return to PLOF as able.     PERFORMANCE DEFICITS: in functional skills including ADLs, IADLs, strength, pain, and UE functional use, cognitive skills including safety awareness, and psychosocial skills including coping strategies and environmental adaptation.   IMPAIRMENTS: are limiting patient from ADLs, IADLs, rest and sleep, and leisure.   COMORBIDITIES: may have co-morbidities  that affects occupational performance. Patient will benefit from skilled OT to address above impairments and improve overall function.  MODIFICATION OR ASSISTANCE TO COMPLETE EVALUATION: Min-Moderate modification of tasks or assist with assess necessary to complete an evaluation.  OT OCCUPATIONAL PROFILE AND HISTORY: Detailed assessment: Review of records and additional review of physical, cognitive, psychosocial history related to current functional performance.  CLINICAL DECISION MAKING: Moderate - several treatment options, min-mod task modification necessary  REHAB POTENTIAL: Good  EVALUATION COMPLEXITY: Moderate      PLAN:  OT FREQUENCY: 1x/week  OT DURATION: 8 weeks  PLANNED INTERVENTIONS: 97168 OT Re-evaluation, 97535 self care/ADL training, 02889 therapeutic exercise, 97530 therapeutic activity, 97140 manual therapy, 97035 ultrasound, 97018 paraffin, 02960 fluidotherapy, 97010 moist heat, 97010 cryotherapy,  scar mobilization, passive range of motion, energy conservation, coping strategies training, patient/family education, and DME and/or AE instructions  RECOMMENDED OTHER SERVICES: none  CONSULTED AND AGREED WITH PLAN OF CARE: Patient  PLAN FOR NEXT SESSION: Monitor scar area Scar massage  Modalities (heat, ultrasound, fluido) if surgical area is closed AE recommendations  Obtain grip strength  DOS 12/02/23-DOE 12/17/23: 2 weeks post surgery this date  For all possible CPT codes, reference the Planned Interventions line above.     Check all conditions that are expected to impact treatment: {Conditions expected to impact treatment:Diabetes mellitus, Musculoskeletal disorders, and Active major medical illness (Lupus)  If treatment provided at initial evaluation, no treatment charged due to lack of authorization.       Rocky Dutch, OT 12/17/2023, 10:59 AM

## 2023-12-17 ENCOUNTER — Ambulatory Visit: Attending: Orthopedic Surgery

## 2023-12-17 ENCOUNTER — Other Ambulatory Visit: Payer: Self-pay

## 2023-12-17 DIAGNOSIS — G5602 Carpal tunnel syndrome, left upper limb: Secondary | ICD-10-CM | POA: Insufficient documentation

## 2023-12-17 DIAGNOSIS — R29898 Other symptoms and signs involving the musculoskeletal system: Secondary | ICD-10-CM | POA: Diagnosis present

## 2023-12-17 DIAGNOSIS — M25532 Pain in left wrist: Secondary | ICD-10-CM | POA: Insufficient documentation

## 2023-12-17 DIAGNOSIS — R208 Other disturbances of skin sensation: Secondary | ICD-10-CM | POA: Diagnosis present

## 2023-12-17 DIAGNOSIS — M6281 Muscle weakness (generalized): Secondary | ICD-10-CM | POA: Diagnosis present

## 2023-12-17 DIAGNOSIS — M79642 Pain in left hand: Secondary | ICD-10-CM | POA: Diagnosis present

## 2023-12-17 DIAGNOSIS — R278 Other lack of coordination: Secondary | ICD-10-CM | POA: Insufficient documentation

## 2023-12-17 DIAGNOSIS — R29818 Other symptoms and signs involving the nervous system: Secondary | ICD-10-CM | POA: Diagnosis present

## 2023-12-17 DIAGNOSIS — M25541 Pain in joints of right hand: Secondary | ICD-10-CM | POA: Diagnosis present

## 2023-12-17 NOTE — Patient Instructions (Addendum)
   WEBSITE: [email or web address]

## 2023-12-20 ENCOUNTER — Telehealth: Payer: Self-pay | Admitting: Orthopedic Surgery

## 2023-12-20 ENCOUNTER — Other Ambulatory Visit: Payer: Self-pay | Admitting: Orthopedic Surgery

## 2023-12-20 ENCOUNTER — Other Ambulatory Visit: Payer: Self-pay

## 2023-12-20 ENCOUNTER — Other Ambulatory Visit: Payer: Self-pay | Admitting: *Deleted

## 2023-12-20 MED ORDER — OXYCODONE HCL 5 MG PO TABS: 5.0000 mg | ORAL_TABLET | Freq: Four times a day (QID) | ORAL | 0 refills | Status: AC | PRN

## 2023-12-20 NOTE — Patient Outreach (Signed)
 Complex Care Management   Visit Note  12/20/2023  Name:  Stacy [name] [facility] MRN: [id number] DOB: January 16, 1961  Situation: Referral received for Complex Care Management related to Diabetes and HTN I obtained verbal consent from Patient.  Visit completed with Patient  on the phone  Background:   Past Medical History:  Diagnosis Date   Allergy     Shellfish, cleaning products   Anxiety    Arthritis    Arthrofibrosis of total knee replacement    right   Asthma    COPD (chronic obstructive pulmonary disease) (HCC)    Depression    Diabetes mellitus    Type II   GERD (gastroesophageal reflux disease)    Pt on Protonix  daily   Glaucoma    Gout    Headache(784.0)    otc meds prn   Hyperlipidemia    Hypertension    Irritable bowel syndrome 11/19/2010   Nausea and vomiting 11/30/2023   Neuropathy    Pneumonia YRS AGO   Restless legs    Shortness of breath    07/14/2018- uses  4 times a day   Sleep apnea     Assessment: Patient Reported Symptoms:  Cognitive Cognitive Status: No symptoms reported, Able to follow simple commands, Alert and oriented to person, place, and time, Insightful and able to interpret abstract concepts, Normal speech and language skills Cognitive/Intellectual Conditions Management [RPT]: None reported or documented in medical history or problem list   Health Maintenance Behaviors: Annual physical exam, Sleep adequate, Spiritual practice(s), Stress management, Healthy diet, Social activities Healing Pattern: Fast Health Facilitated by: Healthy diet, Pain control, Prayer/meditation, Rest, Stress management  Neurological Neurological Review of Symptoms: No symptoms reported Neurological Management Strategies: Adequate rest, Routine screening Neurological Self-Management Outcome: 4 (good)  HEENT HEENT Symptoms Reported: No symptoms reported HEENT Management Strategies: Routine screening, Adequate rest HEENT Self-Management Outcome: 4 (good)     Cardiovascular Cardiovascular Symptoms Reported: No symptoms reported Does patient have uncontrolled Hypertension?: Yes Is patient checking Blood Pressure at home?: Yes Patient's Recent BP reading at home: Patient reports that blood pressure is 120/80 today. Cardiovascular Management Strategies: Coping strategies, Medication therapy, Routine screening, Adequate rest, Medical device Cardiovascular Self-Management Outcome: 4 (good)  Respiratory Respiratory Symptoms Reported: No symptoms reported, Shortness of breath Other Respiratory Symptoms: Patient reports that occassional shortness of breath when she has to ambulate up her stairs.  She reports that she has nebulizer treatments that she takes for shortness of breath.  She reports that she continues to use CPAP at night. Respiratory Management Strategies: Adequate rest, CPAP, Routine screening, Coping strategies Respiratory Self-Management Outcome: 3 (uncertain)  Endocrine Endocrine Symptoms Reported: No symptoms reported Is patient diabetic?: Yes Is patient checking blood sugars at home?: Yes List most recent blood sugar readings, include date and time of day: Patient reports that blood sugar today was 115 Endocrine Self-Management Outcome: 4 (good)  Gastrointestinal Gastrointestinal Symptoms Reported: No symptoms reported Gastrointestinal Management Strategies: Adequate rest, Activity Gastrointestinal Self-Management Outcome: 4 (good)    Genitourinary Genitourinary Symptoms Reported: Incontinence Additional Genitourinary Details: Patient reports that she wears briefs. Genitourinary Management Strategies: Adequate rest, Medication therapy, Medical device, Incontinence garment/pad Genitourinary Self-Management Outcome: 4 (good)  Integumentary Integumentary Symptoms Reported: Sweating Other Integumentary Symptoms: Patient reports that she has excessive sweating Skin Management Strategies: Adequate rest, Routine screening Skin  Self-Management Outcome: 4 (good) Skin Comment: Patient reports that she has a mass to her left arm that Orthopedics is following  Musculoskeletal Musculoskelatal Symptoms Reviewed: Back pain Additional Musculoskeletal  Details: Patient informs me that she also has a brace for her left wrist. Musculoskeletal Management Strategies: Adequate rest, Medication therapy Musculoskeletal Self-Management Outcome: 3 (uncertain) Falls in the past year?: Yes (Patient reports that she has fallen since last outreach.  Patient reports that her left knee buckled and her leg went out.) Number of falls in past year: 2 or more Patient at Risk for Falls Due to: History of fall(s), Impaired balance/gait, Impaired mobility, Orthopedic patient Fall risk Follow up: Falls evaluation completed, Education provided, Falls prevention discussed, Follow up appointment  Psychosocial Psychosocial Symptoms Reported: Anxiety - if selected complete GAD Additional Psychological Details: Patient reports that she has had increase anxiety due to an issue with her insurance.  She informs me that she went to DSS to handle the situation.  She informs me that she uses hydroxyzine  as needed for anxiety.  She also goes to couseling every Wednesday for therapy. Behavioral Management Strategies: Adequate rest, Coping strategies, Counseling, Support system, Medication therapy Behavioral Health Self-Management Outcome: 4 (good) Major Change/Loss/Stressor/Fears (CP): Denies Techniques to Cope with Loss/Stress/Change: Counseling, Diversional activities, Medication Quality of Family Relationships: helpful, involved, supportive Do you feel physically threatened by others?: No    12/20/2023    PHQ2-9 Depression Screening   Little interest or pleasure in doing things Not at all  Feeling down, depressed, or hopeless Not at all  PHQ-2 - Total Score 0  Trouble falling or staying asleep, or sleeping too much    Feeling tired or having little energy     Poor appetite or overeating     Feeling bad about yourself - or that you are a failure or have let yourself or your family down    Trouble concentrating on things, such as reading the newspaper or watching television    Moving or speaking so slowly that other people could have noticed.  Or the opposite - being so fidgety or restless that you have been moving around a lot more than usual    Thoughts that you would be better off dead, or hurting yourself in some way    PHQ2-9 Total Score    If you checked off any problems, how difficult have these problems made it for you to do your work, take care of things at home, or get along with other people    Depression Interventions/Treatment      Vitals:   12/20/23 0935  BP: 120/80   Pain Scale: 0-10 Pain Score: 0-No pain  Medications Reviewed Today     Reviewed by Jorja Nichole LABOR, RN (Case Manager) on 12/20/23 at [phone]  Med List Status: <None>   Medication Order Taking? Sig Documenting Provider Last Dose Status Informant  Accu-Chek Softclix Lancets lancets [id number] Yes USE AS INSTRUCTED 3 TIMES A DAY Vicci Barnie NOVAK, MD  Active   albuterol  (PROVENTIL ) (2.5 MG/3ML) 0.083% nebulizer solution [id number] Yes USE ONE VIAL FOR UP TO 3 TIMES DAILY. Vicci Barnie NOVAK, MD  Active   allopurinol  (ZYLOPRIM ) 100 MG tablet [id number] Yes TAKE 1 TABLET (100 MG TOTAL) BY MOUTH DAILY.(AM) Vicci Barnie NOVAK, MD  Active   benzonatate  (TESSALON ) 200 MG capsule [id number] Yes Take 1 capsule (200 mg total) by mouth 2 (two) times daily as needed for cough. Leavy Lucas Fox, PA-C  Active   Blood Glucose Monitoring Suppl (ACCU-CHEK AVIVA PLUS) w/Device KIT [id number] Yes UAD to check blood sugars. Vicci Barnie NOVAK, MD  Active   celecoxib  (CELEBREX ) 200 MG capsule [id number] Yes Take 1 capsule (200  mg total) by mouth daily. Vicci Barnie NOVAK, MD  Active   clopidogrel  (PLAVIX ) 75 MG tablet [id number] Yes TAKE 1 TABLET (75 MG TOTAL) BY MOUTH DAILY. (AM) Vicci Barnie NOVAK, MD  Active   diclofenac  Sodium (VOLTAREN ) 1 % GEL [id number] Yes Apply 2 g topically 4 (four) times daily. Gretta Bertrum ORN, PA-C  Active   DULoxetine  (CYMBALTA ) 60 MG capsule [id number] Yes TAKE 1 CAPSULE (60 MG TOTAL) BY MOUTH DAILY (AM) Vicci Barnie NOVAK, MD  Active   EPINEPHrine  0.3 mg/0.3 mL IJ SOAJ injection [id number] Yes 0.3 mg IM x 1 PRN for allergic reaction Vicci Barnie NOVAK, MD  Active   FARXIGA  10 MG TABS tablet [id number] Yes TAKE 1 TABLET (10 MG TOTAL) BY MOUTH DAILY BEFORE BREAKFAST. Vicci Barnie NOVAK, MD  Active   [name] 325 (65 Fe) MG tablet [id number] Yes TAKE ONE TABLET ( 325 MG ) BY MOUTH DAILY (AM) Vicci Barnie NOVAK, MD  Active   fluticasone  (FLONASE ) 50 MCG/ACT nasal spray [id number] Yes PLACE 1 SPRAY INTO BOTH NOSTRILS DAILY. Vicci Barnie NOVAK, MD  Active     Discontinued 04/16/11 0943 glucose blood (ACCU-CHEK AVIVA PLUS) test strip [id number] Yes USE AS INSTRUCTED 3 TIMES A DAY. E11.9, Z79.84 Vicci Barnie NOVAK, MD  Active   glucose chewable tablet 12 g [id number]   Vicci Barnie NOVAK, MD  Active   glycopyrrolate  (ROBINUL ) 2 MG tablet [id number] Yes Take 2 mg by mouth 3 (three) times daily. [provider]  Active Self, Pharmacy Records  GVOKE PFS  1 MG/0.2ML SOSY [id number] Yes USE ONE SYRINGE (1 MG) UNDER THE SKIN AS NEEDED FOR SEVERE SYMPTOMATIC LOW BLOOD SUGAR Vicci Barnie NOVAK, MD  Active   hydrOXYzine  (ATARAX ) 10 MG tablet [id number] Yes TAKE 1 TABLET IN THE MORNING, THEN 1 TABLET AT NOON AND 2 TABLETS IN THE EVENING AS NEEDED. Vicci Barnie NOVAK, MD  Active   Lancets (ACCU-CHEK SOFT [place]) lancets [id number] Yes Use as instructed Danton Jon HERO, PA-C  Active Self, Pharmacy Records   Patient not taking:   Discontinued 04/16/11 0916 (Error) loratadine  (CLARITIN ) 10 MG tablet [id number] Yes TAKE 1 TABLET (10 MG TOTAL) BY MOUTH DAILY AS NEEDED FOR ALLERGIES. (AM) Vicci Barnie NOVAK, MD  Active   melatonin 5 MG TABS [id number] Yes Take 5 mg by mouth at bedtime.  [provider]  Active Self, Pharmacy Records  metFORMIN  (GLUCOPHAGE -XR) 750 MG 24 hr tablet [id number] Yes TAKE 1 TABLET (750 MG TOTAL) BY MOUTH DAILY WITH BREAKFAST. Vicci Barnie NOVAK, MD  Active   methocarbamol  (ROBAXIN ) 500 MG tablet [id number] Yes TAKE 1 TABLET (500 MG TOTAL) BY MOUTH EVERY 6 (SIX) HOURS AS NEEDED FOR MUSCLE SPASMS. Gretta Bertrum ORN, PA-C  Active   Methylnaltrexone  Bromide 8 MG/0.4ML SOLN [id number] Yes Take 0.4 mLs by mouth as needed. Take once daily as needed Rogelia Jerilynn RAMAN, MD  Active   montelukast  (SINGULAIR ) 10 MG tablet [id number] Yes Take 10 mg by mouth daily. [provider]  Active Self, Pharmacy Records  Multiple Vitamins-Minerals (CENTRUM SILVER 50+WOMEN PO) [id number] Yes Take 1 tablet by mouth daily. [provider]  Active Self, Pharmacy Records  ondansetron  (ZOFRAN -ODT) 4 MG disintegrating tablet [id number] Yes Take 1 tablet (4 mg total) by mouth every 8 (eight) hours as needed for nausea or vomiting. Tapia Cedeno, Jorge, PA-C  Active   oxyCODONE  (ROXICODONE ) 5 MG immediate release tablet [id number] Yes Take 1 tablet (5 mg total) by mouth every 6 (six) hours as needed. Agarwala, Anshul,  MD  Active   pantoprazole  (PROTONIX ) 40 MG tablet [id number] Yes Take 40 mg by mouth daily. [provider]  Active Self, Pharmacy Records  potassium chloride  SA (KLOR-CON  M) 20 MEQ tablet [id number] Yes TAKE 1 TABLET (20 MEQ TOTAL) BY MOUTH DAILY.(NOON ) Vicci Barnie NOVAK, MD  Active   Respiratory Therapy Supplies (FLUTTER) DEVI [id number] Yes Use after breathing treatment 4 times daily Brien Belvie BRAVO, MD  Active Self, Pharmacy Records  rosuvastatin  (CRESTOR ) 20 MG tablet [id number] Yes Take 1 tablet (20 mg total) by mouth daily. Stop Atorvastatin  Vicci Barnie NOVAK, MD  Active   SPIRIVA  HANDIHALER 18 MCG inhalation capsule [id number] Yes Place 1 capsule (18 mcg total) into inhaler and inhale daily. Vicci Barnie NOVAK, MD  Active   SYMBICORT  160-4.5  MCG/ACT inhaler [id number] Yes Inhale 1 puff into the lungs 2 (two) times daily. [provider]  Active Self, Pharmacy Records  triamcinolone  cream (KENALOG ) 0.1 % [id number] Yes Apply 1 Application topically 2 (two) times daily as needed (irritation). Danton Slough M, PA-C  Active   valsartan -hydrochlorothiazide  (DIOVAN -HCT) 160-12.5 MG tablet [id number] Yes TAKE ONE TABLET BY MOUTH ONCE DAILY (AM) Vicci Barnie NOVAK, MD  Active             Recommendation:   PCP Follow-up Specialty provider follow-up Rehab-12/24/23; Orthopedics-01/12/24; Podiatry-03/14/24 Continue Current Plan of Care  Follow Up Plan:   Telephone follow-up in 1 month: 01/24/24 @ 9 am  Stevan Eberwein, RN, SCIENTIST, RESEARCH (PHYSICAL SCIENCES), Theatre Manager [employer] [phone]

## 2023-12-20 NOTE — Telephone Encounter (Signed)
 Patient called. She would like some pain medication.

## 2023-12-20 NOTE — Patient Instructions (Signed)
 Visit Information  Ms. Brazeau was given information about Medicaid Managed Care team care coordination services as a part of their [facility] Community Plan Medicaid benefit.   If you would like to schedule transportation through your [facility], please call the following number at least 2 days in advance of your appointment: [phone]   Rides for urgent appointments can also be made after hours by calling Member Services.  Call the Behavioral Health Crisis Line at [phone], at any time, 24 hours a day, 7 days a week. If you are in danger or need immediate medical attention call 911.  Please see education materials related to HTN and Diabetes provided by MyChart link.  Care plan and visit instructions communicated with the patient verbally today. Patient agrees to receive a copy in MyChart. Active MyChart status and patient understanding of how to access instructions and care plan via MyChart confirmed with patient.     Telephone follow up appointment with Managed Medicaid care management team member scheduled for: If patient returns call to provider office, please advise to call Managed Medicaid Care Guide at [phone].  Follow up appointment - 01/24/24  Brittanie Dosanjh, RN, BSN, ACM RN Care Manager [facility] [phone]   Following is a copy of your plan of care:  There are no care plans that you recently modified to display for this patient.

## 2023-12-20 NOTE — Telephone Encounter (Signed)
 New RX sent

## 2023-12-21 ENCOUNTER — Ambulatory Visit (INDEPENDENT_AMBULATORY_CARE_PROVIDER_SITE_OTHER): Admitting: Orthopedic Surgery

## 2023-12-21 DIAGNOSIS — Z9889 Other specified postprocedural states: Secondary | ICD-10-CM

## 2023-12-21 NOTE — Progress Notes (Unsigned)
 Pt came in today worried about her incision from her left CTR on 12/02/23. Her wound seemed a bit irritated and open. I called Dr. Erwin and he advised to put Xeroform on the wound and wrap up and to advise the pt to do daily dressing changes. I wrapped the pt up with Xeroform, Kerlix and an Ace bandage. Pt stated that she felt better and knows to call the office if she is worried about anything else.

## 2023-12-23 ENCOUNTER — Telehealth: Payer: Self-pay | Admitting: Internal Medicine

## 2023-12-23 NOTE — Telephone Encounter (Signed)
 Copied from CRM [id number]. Topic: Clinical - Prescription Issue >> Dec 23, 2023 12:11 PM Emylou G wrote:  Reason for CRM: Patient called.[name] asked to send in refill for continence briefs w/aeroflow

## 2023-12-24 ENCOUNTER — Ambulatory Visit

## 2023-12-24 DIAGNOSIS — M79642 Pain in left hand: Secondary | ICD-10-CM

## 2023-12-24 DIAGNOSIS — G5602 Carpal tunnel syndrome, left upper limb: Secondary | ICD-10-CM

## 2023-12-24 DIAGNOSIS — M25532 Pain in left wrist: Secondary | ICD-10-CM

## 2023-12-24 DIAGNOSIS — R278 Other lack of coordination: Secondary | ICD-10-CM

## 2023-12-24 NOTE — Therapy (Signed)
 OUTPATIENT OCCUPATIONAL THERAPY ORTHO TREATMENT  Patient Name: Stacy [name] MRN: [id number] DOB:[birth year], 63 y.o., female Today's Date: 12/24/2023  PCP: Vicci Barnie NOVAK, MD REFERRING PROVIDER: Arlinda Buster, MD  END OF SESSION:  OT End of Session - 12/24/23 1232     Visit Number 2    Number of Visits 9    Authorization Type [employer]-no auth required; 60 visit limit PT/OT combined, received 14 sessions including eval last POC 04/02/23-06/15/23    OT Start Time 1232    OT Stop Time 1315    OT Time Calculation (min) 43 min    Activity Tolerance Patient tolerated treatment well    Behavior During Therapy WFL for tasks assessed/performed          Past Medical History:  Diagnosis Date   Allergy     Shellfish, cleaning products   Anxiety    Arthritis    Arthrofibrosis of total knee replacement    right   Asthma    COPD (chronic obstructive pulmonary disease) (HCC)    Depression    Diabetes mellitus    Type II   GERD (gastroesophageal reflux disease)    Pt on Protonix  daily   Glaucoma    Gout    Headache(784.0)    otc meds prn   Hyperlipidemia    Hypertension    Irritable bowel syndrome 11/19/2010   Nausea and vomiting 11/30/2023   Neuropathy    Pneumonia YRS AGO   Restless legs    Shortness of breath    07/14/2018- uses  4 times a day   Sleep apnea    Past Surgical History:  Procedure Laterality Date   BACK SURGERY     feb 21, 23   CARPAL TUNNEL RELEASE Right 03/26/2023   Procedure: RIGHT CARPAL TUNNEL RELEASE;  Surgeon: Arlinda Buster, MD;  Location: [facility];  Service: Orthopedics;  Laterality: Right;   CARPOMETACARPEL SUSPENSION PLASTY Right 03/26/2023   Procedure: RIGHT THUMB CARPOMETACARPEL (CMC)  ARTHROPLASTY WITH INTERNAL BRACE;  Surgeon: Arlinda Buster, MD;  Location: [facility];  Service: Orthopedics;  Laterality: Right;   CHOLECYSTECTOMY     COLONOSCOPY     ENDOMETRIAL ABLATION  10/2010   HERNIA  REPAIR     umbicial hernia   JOINT REPLACEMENT Left 03/14/2019   Dr. Vernetta hip   KNEE ARTHROSCOPY Left    06/07/2017 Dr. Addie of [facility]   KNEE ARTHROSCOPY Left 03/21/2020   Procedure: LEFT KNEE ARTHROSCOPY WITH PARTIAL MEDIAL MENISCECTOMY;  Surgeon: Vernetta Lonni GRADE, MD;  Location: [facility];  Service: Orthopedics;  Laterality: Left;   KNEE CLOSED REDUCTION Right 12/06/2015   Procedure: CLOSED MANIPULATION RIGHT KNEE;  Surgeon: Lynwood FORBES Better, MD;  Location: [facility];  Service: Orthopedics;  Laterality: Right;   KNEE CLOSED REDUCTION Right 01/17/2016   Procedure: CLOSED MANIPULATION RIGHT KNEE;  Surgeon: Lynwood FORBES Better, MD;  Location: [facility];  Service: Orthopedics;  Laterality: Right;   KNEE JOINT MANIPULATION Right 12/06/2015   LACRIMAL TUBE INSERTION Bilateral 03/01/2019   Procedure: LACRIMAL TUBE INSERTION;  Surgeon: Jacques Sharper, MD;  Location: [facility];  Service: Ophthalmology;  Laterality: Bilateral;   LACRIMAL TUBE REMOVAL Bilateral 05/03/2019   Procedure: BILATERAL NASOLACRIMAL DUCT PROBING, IIRIGATION AND TUBE REMOVAL BOTH EYES;  Surgeon: Jacques Sharper, MD;  Location: [facility];  Service: Ophthalmology;  Laterality: Bilateral;   LUMBAR LAMINECTOMY/DECOMPRESSION MICRODISCECTOMY N/A 06/03/2015   Procedure: Bilateral lateral recess decompression L2-3, L3-4, L4-5;  Surgeon:  Lynwood FORBES Better, MD;  Location: [facility];  Service: Orthopedics;  Laterality: N/A;   LUMBAR LAMINECTOMY/DECOMPRESSION MICRODISCECTOMY N/A 10/02/2016   Procedure: Right L5-S1 Lateral Recess Decompression  microdiscectomy;  Surgeon: Better Lynwood FORBES, MD;  Location: [name];  Service: Orthopedics;  Laterality: N/A;   LUMBAR LAMINECTOMY/DECOMPRESSION MICRODISCECTOMY N/A 07/15/2018   Procedure: LEFT L3-4 MICRODISCECTOMY;  Surgeon: Better Lynwood FORBES, MD;  Location: [name];  Service: Orthopedics;  Laterality: N/A;   svd      x 2   TEAR DUCT PROBING Bilateral 03/01/2019    Procedure: TEAR DUCT PROBING WITH IRRIGATION;  Surgeon: Jacques Sharper, MD;  Location: [facility];  Service: Ophthalmology;  Laterality: Bilateral;   TOTAL HIP ARTHROPLASTY Left 03/14/2019   Procedure: LEFT TOTAL HIP ARTHROPLASTY ANTERIOR APPROACH;  Surgeon: Vernetta Lonni GRADE, MD;  Location: [facility];  Service: Orthopedics;  Laterality: Left;   TOTAL KNEE ARTHROPLASTY Right 09/06/2015   Procedure: RIGHT TOTAL KNEE ARTHROPLASTY;  Surgeon: Lynwood FORBES Better, MD;  Location: [facility];  Service: Orthopedics;  Laterality: Right;   TOTAL KNEE REVISION Right 05/12/2022   Procedure: RIGHT TOTAL KNEE REVISION ARTHROPLASTY;  Surgeon: Vernetta Lonni GRADE, MD;  Location: [facility];  Service: Orthopedics;  Laterality: Right;   TUBAL LIGATION     UPPER GASTROINTESTINAL ENDOSCOPY  04/28/2011   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 tophus 11/30/2023   Constipation 11/30/2023   Diarrhea 11/30/2023   Diverticular disease of colon 11/30/2023   Enteric campylobacteriosis 11/30/2023   Gastroesophageal reflux disease with esophagitis 11/30/2023   Gastroparesis 11/30/2023   Generalized abdominal pain 11/30/2023   Nausea and vomiting 11/30/2023   Arthritis of carpometacarpal [facility]) joint of right thumb 03/26/2023   Carpal tunnel syndrome, right upper limb 03/26/2023   Status post revision of total replacement of right knee 05/12/2022   History of colonic polyps 03/20/2022   Failed total knee, right, subsequent encounter 03/02/2022   Loose right total knee arthroplasty 03/02/2022   Mixed stress and urge urinary incontinence 12/04/2021   Functional fecal incontinence 12/04/2021   IBS (irritable bowel syndrome) 11/19/2021   Spondylolisthesis, lumbar region    Other spondylosis with radiculopathy, lumbar region    Other secondary scoliosis, lumbar region    Fusion of spine of lumbar region 03/25/2021   Colon polyps 06/06/2020   Lupus 06/06/2020   Allergic  rhinitis due to animal (cat) (dog) hair and dander 04/08/2020   Allergic rhinitis due to pollen 04/08/2020   Food allergy  04/08/2020   Acute medial meniscus tear, left, subsequent encounter 03/21/2020   Chronic pain of left knee 02/15/2020   Paresthesia of skin 11/16/2019   History of total knee replacement, right 11/16/2019   Tobacco abuse 11/16/2019   Centrilobular emphysema (HCC) 08/10/2019   Incidental lung nodule, > 3mm and < 8mm 08/10/2019   OSA on CPAP 08/10/2019   Dyspnea on exertion 08/02/2019   Hyperlipidemia 08/02/2019   Lumbar radiculopathy 04/24/2019   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   Post laminectomy syndrome 02/23/2019   Abnormality of gait 02/23/2019   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   Unilateral primary osteoarthritis, left hip 12/28/2018   Lesion of skin of left ear 12/26/2018   Primary osteoarthritis of left hip 12/02/2018   Iron  deficiency anemia 10/16/2018   Chronic pain syndrome 09/08/2018   Chronic pain of right knee 08/11/2018   Status post lumbar laminectomy 07/15/2018   Peripheral arterial disease 04/05/2018   Moderate persistent asthma  without complication 06/29/2017   Environmental and seasonal allergies 06/29/2017   Controlled type 2 diabetes mellitus with diabetic polyneuropathy, without long-term current use of insulin  (HCC) 06/29/2017   Perennial allergic rhinitis 04/08/2017   Sensorineural hearing loss (SNHL), bilateral 04/08/2017   Chronic pansinusitis 03/25/2017   Eustachian tube dysfunction, bilateral 03/25/2017   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   Herniation of lumbar intervertebral disc with radiculopathy 10/02/2016    Class: Chronic   Alopecia areata 08/19/2016   Chondromalacia of both patellae 06/03/2015    Class: Chronic   Spinal stenosis, lumbar region, with neurogenic claudication 06/03/2015   Tobacco use disorder 04/25/2015   DJD (degenerative joint disease) of knee 01/04/2015   Hemorrhoid 11/14/2014   Gout of big toe  07/19/2014   Essential hypertension 08/14/2013   Gastroesophageal reflux disease without esophagitis 08/14/2013   COPD (chronic obstructive pulmonary disease) (HCC) 04/17/2011    ONSET DATE: 11/18/2023  REFERRING DIAG: G56.02 (ICD-10-CM) - Carpal tunnel syndrome, left upper limb  Add'l note per Dr. Erwin: Needs OT 2 weeks s/p left CTR on 12/02/23 NO SPLINT  THERAPY DIAG:  Carpal tunnel syndrome, left upper limb  Pain in left wrist  Pain in left hand  Other lack of coordination  Rationale for Evaluation and Treatment: Rehabilitation  SUBJECTIVE:   SUBJECTIVE STATEMENT: Pt reports her surgical area has not closed over yet, but is completing the stretches she's been issued. Pt accompanied by: self  PERTINENT HISTORY: DM2 with neuropathy, previous CTR 02/25 on RUE, HLD, OA, R TKA, emphysema, lumbar fusion, IBS, incontinence of urine, lung nodule, lumbar radiculopathy, spinal stenosis, COPD, gout  PRECAUTIONS: None  RED FLAGS: None   WEIGHT BEARING RESTRICTIONS: No  PAIN:  Are you having pain? Yes: NPRS scale: 5/10 Pain location: L hand/wrist Pain description: pt reports it comes and goes  Aggravating factors: movement Relieving factors: Rest, medication  FALLS: Has patient fallen in last 6 months? Yes. Number of falls 3, no injuries   LIVING ENVIRONMENT: Lives with: lives alone Lives in: House/apartment Apartment Stairs: Yes: Internal: 13 steps; on left going up and External: 2 steps; on left going up Has following equipment at home: Walker - 4 wheeled and shower chair with a back  PLOF: Independent  PATIENT GOALS: I want to get back to normal and get my hand healed up. I want to build the strength in this hand, and be able to do my daily tasks with this hand.   NEXT MD VISIT: 01/12/24 with Dr. Erwin  OBJECTIVE:  Note: Objective measures were completed at Evaluation unless otherwise noted.  HAND DOMINANCE: Left  ADLs: WFL  FUNCTIONAL OUTCOME  MEASURES: Quick Dash: 68.2/100  UPPER EXTREMITY ROM:     Active ROM Right eval Left eval  Shoulder flexion    Shoulder abduction    Shoulder adduction    Shoulder extension    Shoulder internal rotation    Shoulder external rotation    Elbow flexion    Elbow extension    Wrist flexion    Wrist extension    Wrist ulnar deviation    Wrist radial deviation    Wrist pronation    Wrist supination    (Blank rows = not tested)  Active ROM Right eval Left eval  Thumb MCP (0-60)    Thumb IP (0-80)    Thumb Radial abd/add (0-55)     Thumb Palmar abd/add (0-45)     Thumb Opposition to Small Finger     Index MCP (0-90)  Index PIP (0-100)     Index DIP (0-70)      Long MCP (0-90)      Long PIP (0-100)      Long DIP (0-70)      Ring MCP (0-90)      Ring PIP (0-100)      Ring DIP (0-70)      Little MCP (0-90)      Little PIP (0-100)      Little DIP (0-70)      (Blank rows = not tested)   UPPER EXTREMITY MMT:     MMT Right eval Left eval  Shoulder flexion    Shoulder abduction    Shoulder adduction    Shoulder extension    Shoulder internal rotation    Shoulder external rotation    Middle trapezius    Lower trapezius    Elbow flexion    Elbow extension    Wrist flexion    Wrist extension    Wrist ulnar deviation    Wrist radial deviation    Wrist pronation    Wrist supination    (Blank rows = not tested)  HAND FUNCTION: Grip strength: Right: 61 lbs; Left: NT lbs  COORDINATION: (pt's nails were long and untrimmed at time of testing) 9 Hole Peg test: Right: 41.46 sec; Left: 33.50 sec Box and Blocks:  Right 49 blocks, Left 54 blocks  SENSATION: WFL  EDEMA: Pt reports it was swollen before, but is elevating it whenever possible.   COGNITION: Overall cognitive status: Within functional limits for tasks assessed   OBSERVATIONS: Pain in L hand and wrist, healing surgical area, affecting ability to complete ADL/IADL   TREATMENT DATE: 12/24/23                                                                                                                            Pt reports sleep positioning helping a little bit.  Educated in AE for improved functional use of affected dominant L hand, including foam tubing. Reviewed additional AE such as automatic jar openers.   Pt demonstrated previously issued HEP with good form.   Pt educated in joint protection and energy conservation techniques to reduce strain on L wrist and hand. Educated pt in scar tissue massage once the wound is closed over but instructed to not complete until it is closed over. Pt verbalized understanding.     PATIENT EDUCATION: Education details: SEE ABOVE Person educated: Patient Education method: Explanation, Demonstration, and Handouts Education comprehension: verbalized understanding, returned demonstration, and needs further education  HOME EXERCISE PROGRAM: 12/17/23: IHP HEP s/p CTR, sleep positioning 12/24/23: joint protection, energy conservation  GOALS: Goals reviewed with patient? Yes  SHORT TERM GOALS: Target date: 01/16/24  Pt will be independent with HEP for wrist strengthening and hand strengthening Baseline: New to OP OT Goal status: IN PROGRES  2.  Pt will be independent with scar tissue massage for L hand/wrist Baseline: New to OP OT Goal  status: IN PROGRESS  3.  Pt will verbalize at least 2 joint protection strategies to reduce re-inflammation of L wrist and hand Baseline: New to OP OT Goal status:IN PROGRESS  4.  Pt will report reduced pain in ADL completion to no more than 2/10  Baseline: 6/10 Goal status: INITIAL    LONG TERM GOALS: Target date: 02/18/24  Patient will demonstrate at least 25% improvement with quick Dash score (reporting 43.2% disability or less) indicating improved functional use of affected extremity.  Baseline: Quick Dash: 68.2/100 Goal status: INITIAL  2.  OT will assess grip strength of L hand and make goal as  appropriate. Baseline: New to OP OT Goal status: INITIAL  3.  Pt will be educated in AE/compnsatory strategies to promote ease and reduced pain in ADL/IADL completion Baseline: New to OP OT Goal status: INITIAL    ASSESSMENT:  CLINICAL IMPRESSION: Patient is a 63 y.o. female who was seen today for occupational therapy evaluation for s/p L CTR. Hx includes R CTR, COPD, DM2, and neuropathy. Progress is limited at this time d/t surgical area wound still being open, but pt receptive and rehab potential is good d/t having experienced CTR to R hand and healing well. Patient currently presents below baseline level of functioning demonstrating functional deficits and impairments as noted below. Pt would benefit from skilled OT services in the outpatient setting to work on impairments as noted below to help pt return to PLOF as able.     PERFORMANCE DEFICITS: in functional skills including ADLs, IADLs, strength, pain, and UE functional use, cognitive skills including safety awareness, and psychosocial skills including coping strategies and environmental adaptation.   IMPAIRMENTS: are limiting patient from ADLs, IADLs, rest and sleep, and leisure.   COMORBIDITIES: may have co-morbidities  that affects occupational performance. Patient will benefit from skilled OT to address above impairments and improve overall function.  MODIFICATION OR ASSISTANCE TO COMPLETE EVALUATION: Min-Moderate modification of tasks or assist with assess necessary to complete an evaluation.  OT OCCUPATIONAL PROFILE AND HISTORY: Detailed assessment: Review of records and additional review of physical, cognitive, psychosocial history related to current functional performance.  CLINICAL DECISION MAKING: Moderate - several treatment options, min-mod task modification necessary  REHAB POTENTIAL: Good  EVALUATION COMPLEXITY: Moderate      PLAN:  OT FREQUENCY: 1x/week  OT DURATION: 8 weeks  PLANNED INTERVENTIONS: 97168  OT Re-evaluation, 97535 self care/ADL training, 02889 therapeutic exercise, 97530 therapeutic activity, 97140 manual therapy, 97035 ultrasound, 97018 paraffin, 02960 fluidotherapy, 97010 moist heat, 97010 cryotherapy, scar mobilization, passive range of motion, energy conservation, coping strategies training, patient/family education, and DME and/or AE instructions  RECOMMENDED OTHER SERVICES: none  CONSULTED AND AGREED WITH PLAN OF CARE: Patient  PLAN FOR NEXT SESSION: Monitor scar area Scar massage Modalities (heat, ultrasound, fluido) if surgical area is closed AE recommendations  Obtain grip strength when scar is closed over  DOS 12/02/23-DOT 12/24/23: 3 weeks post surgery this date  For all possible CPT codes, reference the Planned Interventions line above.     Check all conditions that are expected to impact treatment: {Conditions expected to impact treatment:Diabetes mellitus, Musculoskeletal disorders, and Active major medical illness (Lupus)  If treatment provided at initial evaluation, no treatment charged due to lack of authorization.       Rocky Dutch, OT 12/24/2023, 3:05 PM

## 2023-12-24 NOTE — Telephone Encounter (Signed)
 Noted! Thank you

## 2023-12-24 NOTE — Telephone Encounter (Signed)
 Needs appt. Can be an 8:10 video.

## 2023-12-24 NOTE — Patient Instructions (Addendum)
Energy Conservation Techniques  Sit for as many activities as possible. Use slow, smooth movements.  Rushing increases discomfort. Determine the necessity of performing the task.  Simplify those tasks that are necessary.  (Get clothes out of the dryer when they are warm instead of ironing, let dishes air dry, etc.) Take frequent rests both during and between activities.  Avoid repetitive tasks. Pre-plan your activities; try a daily and/or weekly schedule.  Spread out the activities that are most fatiguing (break up cleaning tasks over multiple days). Remember to plan a balance of work, rest and recreation. Consider the best time for each activity.  Do the most exertive task when you have the most energy. Don't carry items if you can push them.  Slide, don't lift. Push, don't pull. Utilize two hands when appropriate. Maintain good posture and use proper body mechanics. Avoid remaining in one position for too long. When lifting, bend at the knees, not at the waist.  Exhale when bending down, inhale when straightening up. Carry objects as close to your body and as near to the center of the pelvis.  11. Avoid wasted body movements (position yourself for the task so that you avoid bending, twisting, etc. when possible). 12. Select the best working environment.  Consider lighting, ventilation, clothing, and equipment. 65. Organize your storage areas, making the items you use daily convenient.  Store heaviest items at waist            height.  Store frequently used items between shoulders and knee height.  Consider leaving frequently used       items on countertops.  (You can organize in storage baskets based on time used/purpose). 14. Feelings and emotions can be real causes of fatigue.  Try to avoid unnecessary worry, irritation, or  frustration.  Avoid stress, it can also be a source of fatigue. 15. Get help from other people for difficult tasks. 16. Explore equipment or items that may be able to do  the job for you with greater ease.  (Electric can  openers, blenders, lightweight items for cleaning, etc.)

## 2023-12-24 NOTE — Telephone Encounter (Signed)
 Patient called back and I scheduled VIDEO VISIT for 11/24 8:10 AM

## 2023-12-27 ENCOUNTER — Ambulatory Visit (HOSPITAL_BASED_OUTPATIENT_CLINIC_OR_DEPARTMENT_OTHER): Admitting: Internal Medicine

## 2023-12-27 DIAGNOSIS — N3946 Mixed incontinence: Secondary | ICD-10-CM | POA: Diagnosis not present

## 2023-12-27 NOTE — Progress Notes (Signed)
 Patient ID: Stacy [name], female   DOB: [birth year], 63 y.o.   MRN: [id number] Virtual Visit via Video Note  I connected with Stacy [name] on 12/27/2023 at 8:11 AM by a video enabled telemedicine application and verified that I am speaking with the correct person using two identifiers.  Location: Patient: home Provider: Office   I discussed the limitations of evaluation and management by telemedicine and the availability of in person appointments. The patient expressed understanding and agreed to proceed.  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 Cheryn Finn.    Discussed the use of AI scribe software for clinical note transcription with the patient, who gave verbal consent to proceed.  History of Present Illness Stacy [name] is a 63 year old female who presents with urinary incontinence.  She has been experiencing urinary incontinence for aout 1.5 yrs, characterized by an inability to hold urine when she gets the urge to urinate and leakage during activities such as laughing, coughing, or sneezing.  She has been using medium-sized incontinence briefs provided by [employer] for about a year and a half to manage her symptoms. No other supplies from [employer] apart from the briefs.      Outpatient Encounter Medications as of 12/27/2023  Medication Sig   Accu-Chek Softclix Lancets lancets USE AS INSTRUCTED 3 TIMES A DAY   albuterol  (PROVENTIL ) (2.5 MG/3ML) 0.083% nebulizer solution USE ONE VIAL FOR UP TO 3 TIMES DAILY.   allopurinol  (ZYLOPRIM ) 100 MG tablet TAKE 1 TABLET (100 MG TOTAL) BY MOUTH DAILY.(AM)   benzonatate  (TESSALON ) 200 MG capsule Take 1 capsule (200 mg  total) by mouth 2 (two) times daily as needed for cough.   Blood Glucose Monitoring Suppl (ACCU-CHEK AVIVA PLUS) w/Device KIT UAD to check blood sugars.   celecoxib  (CELEBREX ) 200 MG capsule Take 1 capsule (200 mg total) by mouth daily.   clopidogrel  (PLAVIX ) 75 MG tablet TAKE 1 TABLET (75 MG TOTAL) BY MOUTH DAILY. (AM)   diclofenac  Sodium (VOLTAREN ) 1 % GEL Apply 2 g topically 4 (four) times daily.   DULoxetine  (CYMBALTA ) 60 MG capsule TAKE 1 CAPSULE (60 MG TOTAL) BY MOUTH DAILY (AM)   EPINEPHrine  0.3 mg/0.3 mL IJ SOAJ injection 0.3 mg IM x 1 PRN for allergic reaction   FARXIGA  10 MG TABS tablet TAKE 1 TABLET (10 MG TOTAL) BY MOUTH DAILY BEFORE BREAKFAST.   [name] 325 (65 Fe) MG tablet TAKE ONE TABLET ( 325 MG ) BY MOUTH DAILY (AM)   fluticasone  (FLONASE ) 50 MCG/ACT nasal spray PLACE 1 SPRAY INTO BOTH NOSTRILS DAILY.   glucose blood (ACCU-CHEK AVIVA PLUS) test strip USE AS INSTRUCTED 3 TIMES A DAY. E11.9, Z79.84   glycopyrrolate  (ROBINUL ) 2 MG tablet Take 2 mg by mouth 3 (three) times daily.   GVOKE PFS  1 MG/0.2ML SOSY USE ONE SYRINGE (1 MG) UNDER THE SKIN AS NEEDED FOR SEVERE SYMPTOMATIC LOW BLOOD SUGAR   hydrOXYzine  (ATARAX ) 10 MG tablet TAKE 1 TABLET IN THE MORNING, THEN 1 TABLET AT NOON AND 2 TABLETS IN THE EVENING AS NEEDED.   Lancets (ACCU-CHEK SOFT TOUCH) lancets Use as instructed   loratadine  (CLARITIN ) 10 MG tablet TAKE 1 TABLET (10 MG TOTAL) BY MOUTH DAILY AS NEEDED FOR ALLERGIES. (AM)   melatonin  5 MG TABS Take 5 mg by mouth at bedtime.   metFORMIN  (GLUCOPHAGE -XR) 750 MG 24 hr tablet TAKE 1 TABLET (750 MG TOTAL) BY MOUTH DAILY WITH BREAKFAST.   methocarbamol  (ROBAXIN ) 500 MG tablet TAKE 1 TABLET (500 MG TOTAL) BY MOUTH EVERY 6 (SIX) HOURS AS NEEDED FOR MUSCLE SPASMS.   Methylnaltrexone  Bromide 8 MG/0.4ML SOLN Take 0.4 mLs by mouth as needed. Take once daily as needed   montelukast  (SINGULAIR ) 10 MG tablet Take 10 mg by mouth daily.   Multiple Vitamins-Minerals (CENTRUM SILVER  50+WOMEN PO) Take 1 tablet by mouth daily.   ondansetron  (ZOFRAN -ODT) 4 MG disintegrating tablet Take 1 tablet (4 mg total) by mouth every 8 (eight) hours as needed for nausea or vomiting.   oxyCODONE  (ROXICODONE ) 5 MG immediate release tablet Take 1 tablet (5 mg total) by mouth every 6 (six) hours as needed.   pantoprazole  (PROTONIX ) 40 MG tablet Take 40 mg by mouth daily.   potassium chloride  SA (KLOR-CON  M) 20 MEQ tablet TAKE 1 TABLET (20 MEQ TOTAL) BY MOUTH DAILY.(NOON )   Respiratory Therapy Supplies (FLUTTER) DEVI Use after breathing treatment 4 times daily   rosuvastatin  (CRESTOR ) 20 MG tablet Take 1 tablet (20 mg total) by mouth daily. Stop Atorvastatin    SPIRIVA  HANDIHALER 18 MCG inhalation capsule Place 1 capsule (18 mcg total) into inhaler and inhale daily.   SYMBICORT  160-4.5 MCG/ACT inhaler Inhale 1 puff into the lungs 2 (two) times daily.   triamcinolone  cream (KENALOG ) 0.1 % Apply 1 Application topically 2 (two) times daily as needed (irritation).   valsartan -hydrochlorothiazide  (DIOVAN -HCT) 160-12.5 MG tablet TAKE ONE TABLET BY MOUTH ONCE DAILY (AM)   [DISCONTINUED] Fluticasone -Salmeterol (ADVAIR) 500-50 MCG/DOSE AEPB Inhale 1 puff into the lungs every 12 (twelve) hours.     [DISCONTINUED] lisinopril (PRINIVIL,ZESTRIL) 40 MG tablet Take 40 mg by mouth daily.   (Patient not taking: Reported on 05/10/2023)   Facility-Administered Encounter Medications as of 12/27/2023  Medication   glucose chewable tablet 12 g      Observations/Objective: Older AAF in NAD  Assessment and Plan: 1. Mixed stress and urge urinary incontinence (Primary) -Rxn will be sent to [employer] to allow her to continue to receive Depends Undergarment   Follow Up Instructions: As previous scheduled for her routine visit   I discussed the assessment and treatment plan with the patient. The patient was provided an opportunity to ask questions and all were answered. The patient agreed with the plan and  demonstrated an understanding of the instructions.   The patient was advised to call back or seek an in-person evaluation if the symptoms worsen or if the condition fails to improve as anticipated.  I spent 6 minutes dedicated to the care of this patient on the date of this encounter to include previsit review of chart, face to face time with pt discussing dx and management and post visit entering of orders.  This note has been created with Education officer, environmental. Any transcriptional errors are unintentional.  Barnie Louder, MD

## 2023-12-28 ENCOUNTER — Ambulatory Visit: Admitting: Occupational Therapy

## 2023-12-28 DIAGNOSIS — R29898 Other symptoms and signs involving the musculoskeletal system: Secondary | ICD-10-CM

## 2023-12-28 DIAGNOSIS — M25541 Pain in joints of right hand: Secondary | ICD-10-CM

## 2023-12-28 DIAGNOSIS — M6281 Muscle weakness (generalized): Secondary | ICD-10-CM

## 2023-12-28 DIAGNOSIS — G5602 Carpal tunnel syndrome, left upper limb: Secondary | ICD-10-CM | POA: Diagnosis not present

## 2023-12-28 DIAGNOSIS — R29818 Other symptoms and signs involving the nervous system: Secondary | ICD-10-CM

## 2023-12-28 DIAGNOSIS — R208 Other disturbances of skin sensation: Secondary | ICD-10-CM

## 2023-12-28 DIAGNOSIS — M79642 Pain in left hand: Secondary | ICD-10-CM

## 2023-12-28 DIAGNOSIS — M25532 Pain in left wrist: Secondary | ICD-10-CM

## 2023-12-28 DIAGNOSIS — R278 Other lack of coordination: Secondary | ICD-10-CM

## 2023-12-28 NOTE — Therapy (Signed)
 OUTPATIENT OCCUPATIONAL THERAPY ORTHO TREATMENT  Patient Name: Stacy [name] [facility] MRN: [id number] DOB:1960-06-10, 63 y.o., female Today's Date: 12/28/2023  PCP: Vicci Barnie NOVAK, MD REFERRING PROVIDER: Arlinda Buster, MD  END OF SESSION:  OT End of Session - 12/28/23 1234     Visit Number 3    Number of Visits 9    Authorization Type [employer]-no auth required; 60 visit limit PT/OT combined, received 14 sessions including eval last POC 04/02/23-06/15/23    OT Start Time 1234    OT Stop Time 1314    OT Time Calculation (min) 40 min    Activity Tolerance Patient tolerated treatment well    Behavior During Therapy WFL for tasks assessed/performed          Past Medical History:  Diagnosis Date   Allergy     Shellfish, cleaning products   Anxiety    Arthritis    Arthrofibrosis of total knee replacement    right   Asthma    COPD (chronic obstructive pulmonary disease) (HCC)    Depression    Diabetes mellitus    Type II   GERD (gastroesophageal reflux disease)    Pt on Protonix  daily   Glaucoma    Gout    Headache(784.0)    otc meds prn   Hyperlipidemia    Hypertension    Irritable bowel syndrome 11/19/2010   Nausea and vomiting 11/30/2023   Neuropathy    Pneumonia YRS AGO   Restless legs    Shortness of breath    07/14/2018- uses  4 times a day   Sleep apnea    Past Surgical History:  Procedure Laterality Date   BACK SURGERY     feb 21, 23   CARPAL TUNNEL RELEASE Right 03/26/2023   Procedure: RIGHT CARPAL TUNNEL RELEASE;  Surgeon: Arlinda Buster, MD;  Location: [facility];  Service: Orthopedics;  Laterality: Right;   CARPOMETACARPEL SUSPENSION PLASTY Right 03/26/2023   Procedure: RIGHT THUMB CARPOMETACARPEL (CMC)  ARTHROPLASTY WITH INTERNAL BRACE;  Surgeon: Arlinda Buster, MD;  Location: [facility];  Service: Orthopedics;  Laterality: Right;   CHOLECYSTECTOMY     COLONOSCOPY     ENDOMETRIAL ABLATION  10/2010   HERNIA  REPAIR     umbicial hernia   JOINT REPLACEMENT Left 03/14/2019   Dr. Vernetta hip   KNEE ARTHROSCOPY Left    06/07/2017 Dr. Addie of [facility]   KNEE ARTHROSCOPY Left 03/21/2020   Procedure: LEFT KNEE ARTHROSCOPY WITH PARTIAL MEDIAL MENISCECTOMY;  Surgeon: Vernetta Lonni GRADE, MD;  Location: [facility];  Service: Orthopedics;  Laterality: Left;   KNEE CLOSED REDUCTION Right 12/06/2015   Procedure: CLOSED MANIPULATION RIGHT KNEE;  Surgeon: Lynwood FORBES Better, MD;  Location: [facility] OR;  Service: Orthopedics;  Laterality: Right;   KNEE CLOSED REDUCTION Right 01/17/2016   Procedure: CLOSED MANIPULATION RIGHT KNEE;  Surgeon: Lynwood FORBES Better, MD;  Location: [facility] OR;  Service: Orthopedics;  Laterality: Right;   KNEE JOINT MANIPULATION Right 12/06/2015   LACRIMAL TUBE INSERTION Bilateral 03/01/2019   Procedure: LACRIMAL TUBE INSERTION;  Surgeon: Jacques Sharper, MD;  Location: [facility];  Service: Ophthalmology;  Laterality: Bilateral;   LACRIMAL TUBE REMOVAL Bilateral 05/03/2019   Procedure: BILATERAL NASOLACRIMAL DUCT PROBING, IIRIGATION AND TUBE REMOVAL BOTH EYES;  Surgeon: Jacques Sharper, MD;  Location:  SURGERY CENTER;  Service: Ophthalmology;  Laterality: Bilateral;   LUMBAR LAMINECTOMY/DECOMPRESSION MICRODISCECTOMY N/A 06/03/2015   Procedure: Bilateral lateral recess decompression L2-3, L3-4, L4-5;  Surgeon:  Lynwood FORBES Better, MD;  Location[facility] OR;  Service: Orthopedics;  Laterality: N/A;   LUMBAR LAMINECTOMY/DECOMPRESSION MICRODISCECTOMY N/A 10/02/2016   Procedure: Right L5-S1 Lateral Recess Decompression  microdiscectomy;  Surgeon: Better Lynwood FORBES, MD;  Location: [facility] OR;  Service: Orthopedics;  Laterality: N/A;   LUMBAR LAMINECTOMY/DECOMPRESSION MICRODISCECTOMY N/A 07/15/2018   Procedure: LEFT L3-4 MICRODISCECTOMY;  Surgeon: Better Lynwood FORBES, MD;  Location: [facility] OR;  Service: Orthopedics;  Laterality: N/A;   svd      x 2   TEAR DUCT PROBING Bilateral 03/01/2019    Procedure: TEAR DUCT PROBING WITH IRRIGATION;  Surgeon: Jacques Sharper, MD;  Location: [facility];  Service: Ophthalmology;  Laterality: Bilateral;   TOTAL HIP ARTHROPLASTY Left 03/14/2019   Procedure: LEFT TOTAL HIP ARTHROPLASTY ANTERIOR APPROACH;  Surgeon: Vernetta Lonni GRADE, MD;  Location: [facility] OR;  Service: Orthopedics;  Laterality: Left;   TOTAL KNEE ARTHROPLASTY Right 09/06/2015   Procedure: RIGHT TOTAL KNEE ARTHROPLASTY;  Surgeon: Lynwood FORBES Better, MD;  Location: [facility] OR;  Service: Orthopedics;  Laterality: Right;   TOTAL KNEE REVISION Right 05/12/2022   Procedure: RIGHT TOTAL KNEE REVISION ARTHROPLASTY;  Surgeon: Vernetta Lonni GRADE, MD;  Location: [facility] OR;  Service: Orthopedics;  Laterality: Right;   TUBAL LIGATION     UPPER GASTROINTESTINAL ENDOSCOPY  04/28/2011   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 tophus 11/30/2023   Constipation 11/30/2023   Diarrhea 11/30/2023   Diverticular disease of colon 11/30/2023   Enteric campylobacteriosis 11/30/2023   Gastroesophageal reflux disease with esophagitis 11/30/2023   Gastroparesis 11/30/2023   Generalized abdominal pain 11/30/2023   Nausea and vomiting 11/30/2023   Arthritis of carpometacarpal [facility]) joint of right thumb 03/26/2023   Carpal tunnel syndrome, right upper limb 03/26/2023   Status post revision of total replacement of right knee 05/12/2022   History of colonic polyps 03/20/2022   Failed total knee, right, subsequent encounter 03/02/2022   Loose right total knee arthroplasty 03/02/2022   Mixed stress and urge urinary incontinence 12/04/2021   Functional fecal incontinence 12/04/2021   IBS (irritable bowel syndrome) 11/19/2021   Spondylolisthesis, lumbar region    Other spondylosis with radiculopathy, lumbar region    Other secondary scoliosis, lumbar region    Fusion of spine of lumbar region 03/25/2021   Colon polyps 06/06/2020   Lupus 06/06/2020   Allergic  rhinitis due to animal (cat) (dog) hair and dander 04/08/2020   Allergic rhinitis due to pollen 04/08/2020   Food allergy  04/08/2020   Acute medial meniscus tear, left, subsequent encounter 03/21/2020   Chronic pain of left knee 02/15/2020   Paresthesia of skin 11/16/2019   History of total knee replacement, right 11/16/2019   Tobacco abuse 11/16/2019   Centrilobular emphysema (HCC) 08/10/2019   Incidental lung nodule, > 3mm and < 8mm 08/10/2019   OSA on CPAP 08/10/2019   Dyspnea on exertion 08/02/2019   Hyperlipidemia 08/02/2019   Lumbar radiculopathy 04/24/2019   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   Post laminectomy syndrome 02/23/2019   Abnormality of gait 02/23/2019   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   Unilateral primary osteoarthritis, left hip 12/28/2018   Lesion of skin of left ear 12/26/2018   Primary osteoarthritis of left hip 12/02/2018   Iron  deficiency anemia 10/16/2018   Chronic pain syndrome 09/08/2018   Chronic pain of right knee 08/11/2018   Status post lumbar laminectomy 07/15/2018   Peripheral arterial disease 04/05/2018   Moderate persistent asthma  without complication 06/29/2017   Environmental and seasonal allergies 06/29/2017   Controlled type 2 diabetes mellitus with diabetic polyneuropathy, without long-term current use of insulin  (HCC) 06/29/2017   Perennial allergic rhinitis 04/08/2017   Sensorineural hearing loss (SNHL), bilateral 04/08/2017   Chronic pansinusitis 03/25/2017   Eustachian tube dysfunction, bilateral 03/25/2017   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   Herniation of lumbar intervertebral disc with radiculopathy 10/02/2016    Class: Chronic   Alopecia areata 08/19/2016   Chondromalacia of both patellae 06/03/2015    Class: Chronic   Spinal stenosis, lumbar region, with neurogenic claudication 06/03/2015   Tobacco use disorder 04/25/2015   DJD (degenerative joint disease) of knee 01/04/2015   Hemorrhoid 11/14/2014   Gout of big toe  07/19/2014   Essential hypertension 08/14/2013   Gastroesophageal reflux disease without esophagitis 08/14/2013   COPD (chronic obstructive pulmonary disease) (HCC) 04/17/2011    ONSET DATE: 11/18/2023  REFERRING DIAG: G56.02 (ICD-10-CM) - Carpal tunnel syndrome, left upper limb  Add'l note per Dr. Erwin: Needs OT 2 weeks s/p left CTR on 12/02/23 NO SPLINT  THERAPY DIAG:  Carpal tunnel syndrome, left upper limb  Pain in left wrist  Pain in left hand  Other lack of coordination  Muscle weakness (generalized)  Other symptoms and signs involving the musculoskeletal system  Other disturbances of skin sensation  Pain in thumb joint with movement of right hand  Pain in joint of right hand  Other symptoms and signs involving the nervous system  Rationale for Evaluation and Treatment: Rehabilitation  SUBJECTIVE:   SUBJECTIVE STATEMENT: Pt reports her surgical area has not closed over yet, but is completing the stretches she's been issued.  Pt accompanied by: self  PERTINENT HISTORY: DM2 with neuropathy, previous CTR 02/25 on RUE, HLD, OA, R TKA, emphysema, lumbar fusion, IBS, incontinence of urine, lung nodule, lumbar radiculopathy, spinal stenosis, COPD, gout, and  right thumb carpometacarpal arthroplasty with internal bracing  PRECAUTIONS: None  RED FLAGS: None   WEIGHT BEARING RESTRICTIONS: No  PAIN:  Are you having pain? Yes: NPRS scale: 5/10 Pain location: L hand/wrist Pain description: pt reports it comes and goes  Aggravating factors: movement Relieving factors: Rest, medication  FALLS: Has patient fallen in last 6 months? Yes. Number of falls 3, no injuries   LIVING ENVIRONMENT: Lives with: lives alone Lives in: House/apartment Apartment Stairs: Yes: Internal: 13 steps; on left going up and External: 2 steps; on left going up Has following equipment at home: Walker - 4 wheeled and shower chair with a back  PLOF: Independent  PATIENT GOALS: I want to  get back to normal and get my hand healed up. I want to build the strength in this hand, and be able to do my daily tasks with this hand.   NEXT MD VISIT: 01/12/24 with Dr. Erwin  OBJECTIVE:  Note: Objective measures were completed at Evaluation unless otherwise noted.  HAND DOMINANCE: Left  ADLs: WFL  FUNCTIONAL OUTCOME MEASURES: Quick Dash: 68.2/100  UPPER EXTREMITY ROM:   N/T at eval  UPPER EXTREMITY MMT:   N/T at eval  HAND FUNCTION: Grip strength: Right: 61 lbs; Left: NT lbs  COORDINATION: (pt's nails were long and untrimmed at time of testing) 9 Hole Peg test: Right: 41.46 sec; Left: 33.50 sec Box and Blocks:  Right 49 blocks, Left 54 blocks  SENSATION: WFL  EDEMA: Pt reports it was swollen before, but is elevating it whenever possible.   COGNITION: Overall cognitive status: Within functional limits for tasks assessed  OBSERVATIONS: Pain in L hand and wrist, healing surgical area, affecting ability to complete ADL/IADL   TREATMENT:                                                                                                                            - Self-care/home management completed for duration as noted below including: OT educated pt on L incision management including good hand hygiene, thin layer of bacitracin, xeroform, gauze cover, and placement of tensogrip over top to aid in edema and pain reduction will promoting skin healing.   - Manual therapy completed for duration as noted below including:  Therapist completed IASTM using edge mobility tool at site of R thumb incision secondary to reported paresthesias and observed pitting using free up lotion as emollient for reduction of scar tissue and to promote improved AROM and pain reduction of affected extremity. Improved appearance to incision noted upon completion with less palpable adhesions.   PATIENT EDUCATION: Education details: SEE ABOVE Person educated: Patient Education method: Explanation,  Demonstration, and Verbal cues Education comprehension: verbalized understanding, returned demonstration, verbal cues required, and needs further education  HOME EXERCISE PROGRAM: 12/17/23: IHP HEP s/p CTR, sleep positioning 12/24/23: joint protection, energy conservation  GOALS: Goals reviewed with patient? Yes  SHORT TERM GOALS: Target date: 01/16/24  Pt will be independent with HEP for wrist strengthening and hand strengthening Baseline: New to OP OT Goal status: IN PROGRES  2.  Pt will be independent with scar tissue massage for L hand/wrist Baseline: New to OP OT Goal status: IN PROGRESS  3.  Pt will verbalize at least 2 joint protection strategies to reduce re-inflammation of L wrist and hand Baseline: New to OP OT Goal status:IN PROGRESS  4.  Pt will report reduced pain in ADL completion to no more than 2/10  Baseline: 6/10 Goal status: INITIAL    LONG TERM GOALS: Target date: 02/18/24  Patient will demonstrate at least 25% improvement with quick Dash score (reporting 43.2% disability or less) indicating improved functional use of affected extremity.  Baseline: Quick Dash: 68.2/100 Goal status: INITIAL  2.  OT will assess grip strength of L hand and make goal as appropriate. Baseline: New to OP OT Goal status: INITIAL  3.  Pt will be educated in AE/compnsatory strategies to promote ease and reduced pain in ADL/IADL completion Baseline: New to OP OT Goal status: INITIAL  ASSESSMENT:  CLINICAL IMPRESSION: Patient presents to occupational therapy s/p L CTR with history of R CTR and thumb carpometacarpal arthroplasty with internal bracing (03/26/2023). Significant adhesions noted at R thumb incision, which are likely contributing to paresthesias with resultant pitting appearance. L palmar incision showing signs of healing though will require increased time and diligent hand hygiene for full healing.   PERFORMANCE DEFICITS: in functional skills including ADLs, IADLs,  strength, pain, and UE functional use, cognitive skills including safety awareness, and psychosocial skills including coping strategies and environmental adaptation.   IMPAIRMENTS: are limiting patient  from ADLs, IADLs, rest and sleep, and leisure.   COMORBIDITIES: may have co-morbidities  that affects occupational performance. Patient will benefit from skilled OT to address above impairments and improve overall function.  REHAB POTENTIAL: Good  PLAN:  OT FREQUENCY: 1x/week  OT DURATION: 8 weeks  PLANNED INTERVENTIONS: 97168 OT Re-evaluation, 97535 self care/ADL training, 02889 therapeutic exercise, 97530 therapeutic activity, 97140 manual therapy, 97035 ultrasound, 97018 paraffin, 02960 fluidotherapy, 97010 moist heat, 97010 cryotherapy, scar mobilization, passive range of motion, energy conservation, coping strategies training, patient/family education, and DME and/or AE instructions  RECOMMENDED OTHER SERVICES: none  CONSULTED AND AGREED WITH PLAN OF CARE: Patient  PLAN FOR NEXT SESSION: Monitor scar area Scar massage Modalities (heat, ultrasound, fluido) if surgical area is closed AE recommendations  Obtain grip strength when scar is closed over  DOS 12/02/23-DOT 12/24/23: 3 weeks post surgery this date  For all possible CPT codes, reference the Planned Interventions line above.     Check all conditions that are expected to impact treatment: {Conditions expected to impact treatment:Diabetes mellitus, Musculoskeletal disorders, and Active major medical illness (Lupus)  If treatment provided at initial evaluation, no treatment charged due to lack of authorization.       Jocelyn CHRISTELLA Bottom, OT 12/28/2023, 5:32 PM

## 2024-01-06 ENCOUNTER — Ambulatory Visit: Admitting: Occupational Therapy

## 2024-01-06 DIAGNOSIS — M25532 Pain in left wrist: Secondary | ICD-10-CM | POA: Insufficient documentation

## 2024-01-06 DIAGNOSIS — G5602 Carpal tunnel syndrome, left upper limb: Secondary | ICD-10-CM | POA: Insufficient documentation

## 2024-01-06 DIAGNOSIS — R29898 Other symptoms and signs involving the musculoskeletal system: Secondary | ICD-10-CM | POA: Insufficient documentation

## 2024-01-06 DIAGNOSIS — R278 Other lack of coordination: Secondary | ICD-10-CM | POA: Insufficient documentation

## 2024-01-06 DIAGNOSIS — R29818 Other symptoms and signs involving the nervous system: Secondary | ICD-10-CM | POA: Diagnosis present

## 2024-01-06 DIAGNOSIS — M25541 Pain in joints of right hand: Secondary | ICD-10-CM | POA: Insufficient documentation

## 2024-01-06 DIAGNOSIS — M79642 Pain in left hand: Secondary | ICD-10-CM | POA: Diagnosis present

## 2024-01-06 DIAGNOSIS — M6281 Muscle weakness (generalized): Secondary | ICD-10-CM | POA: Diagnosis present

## 2024-01-06 DIAGNOSIS — R208 Other disturbances of skin sensation: Secondary | ICD-10-CM | POA: Diagnosis present

## 2024-01-06 NOTE — Patient Instructions (Addendum)
 Heat Home Program Heat is used as part of your therapy for several reasons.  Heat increases blood flow, which promotes healing. Heat also relaxes muscles and joints which makes exercising easier.  Complete BEFORE exercise to help manage pain and increase stretch.  It can be applied for __10-15_ minutes,  _up to 3_ sessions per day  Use one of the following methods that is most convenient for you  Heating pad: Follow instructions given by manufacturer.  Use on low-medium setting.    Moist heated towel: Soak towel in hot water or place damp towel in microwave and heat for 30 sec. Check temperature to determine if further time needed.  Wring out towel and wrap around affected area, cover with a plastic bag.  Can also wrap a dry towel around the plastic to help retain the heat.    Microwave gel pack: Follow instructions given by manufacturer.  Check temperature before application to ensure not hot enough to cause a burn    Rice sock: This can be made using a sock with no synthetic material and 1.5 cups of rice.  Pour the rice into the sock, tie a knot at the top.  Place in microwave for 1 minute, remove to check temperature to determine if further heating time is required prior to application  Be sure to check the skin periodically to ensure no excessive redness or blisters.  Use with caution in areas with decreased sensation

## 2024-01-06 NOTE — Therapy (Signed)
 OUTPATIENT OCCUPATIONAL THERAPY ORTHO TREATMENT  Patient Name: Stacy [name] [facility] MRN: [id number] DOB:27-Jan-1961, 63 y.o., female Today's Date: 01/06/2024  PCP: Vicci Barnie NOVAK, MD REFERRING PROVIDER: Arlinda Buster, MD  END OF SESSION:  OT End of Session - 01/06/24 0903     Visit Number 4    Number of Visits 9    Authorization Type [employer]-no auth required; 60 visit limit PT/OT combined, received 14 sessions including eval last POC 04/02/23-06/15/23    OT Start Time 0851    OT Stop Time 0929    OT Time Calculation (min) 38 min    Activity Tolerance Patient tolerated treatment well    Behavior During Therapy WFL for tasks assessed/performed          Past Medical History:  Diagnosis Date   Allergy     Shellfish, cleaning products   Anxiety    Arthritis    Arthrofibrosis of total knee replacement    right   Asthma    COPD (chronic obstructive pulmonary disease) (HCC)    Depression    Diabetes mellitus    Type II   GERD (gastroesophageal reflux disease)    Pt on Protonix  daily   Glaucoma    Gout    Headache(784.0)    otc meds prn   Hyperlipidemia    Hypertension    Irritable bowel syndrome 11/19/2010   Nausea and vomiting 11/30/2023   Neuropathy    Pneumonia YRS AGO   Restless legs    Shortness of breath    07/14/2018- uses  4 times a day   Sleep apnea    Past Surgical History:  Procedure Laterality Date   BACK SURGERY     feb 21, 23   CARPAL TUNNEL RELEASE Right 03/26/2023   Procedure: RIGHT CARPAL TUNNEL RELEASE;  Surgeon: Arlinda Buster, MD;  Location: [facility];  Service: Orthopedics;  Laterality: Right;   CARPOMETACARPEL SUSPENSION PLASTY Right 03/26/2023   Procedure: RIGHT THUMB CARPOMETACARPEL (CMC)  ARTHROPLASTY WITH INTERNAL BRACE;  Surgeon: Arlinda Buster, MD;  Location: [facility];  Service: Orthopedics;  Laterality: Right;   CHOLECYSTECTOMY     COLONOSCOPY     ENDOMETRIAL ABLATION  10/2010   HERNIA  REPAIR     umbicial hernia   JOINT REPLACEMENT Left 03/14/2019   Dr. Vernetta hip   KNEE ARTHROSCOPY Left    06/07/2017 Dr. Addie of [facility]   KNEE ARTHROSCOPY Left 03/21/2020   Procedure: LEFT KNEE ARTHROSCOPY WITH PARTIAL MEDIAL MENISCECTOMY;  Surgeon: Vernetta Lonni GRADE, MD;  Location: [facility];  Service: Orthopedics;  Laterality: Left;   KNEE CLOSED REDUCTION Right 12/06/2015   Procedure: CLOSED MANIPULATION RIGHT KNEE;  Surgeon: Lynwood FORBES Better, MD;  Location: [facility] OR;  Service: Orthopedics;  Laterality: Right;   KNEE CLOSED REDUCTION Right 01/17/2016   Procedure: CLOSED MANIPULATION RIGHT KNEE;  Surgeon: Lynwood FORBES Better, MD;  Location: [facility] OR;  Service: Orthopedics;  Laterality: Right;   KNEE JOINT MANIPULATION Right 12/06/2015   LACRIMAL TUBE INSERTION Bilateral 03/01/2019   Procedure: LACRIMAL TUBE INSERTION;  Surgeon: Jacques Sharper, MD;  Location: [facility];  Service: Ophthalmology;  Laterality: Bilateral;   LACRIMAL TUBE REMOVAL Bilateral 05/03/2019   Procedure: BILATERAL NASOLACRIMAL DUCT PROBING, IIRIGATION AND TUBE REMOVAL BOTH EYES;  Surgeon: Jacques Sharper, MD;  Location: [facility];  Service: Ophthalmology;  Laterality: Bilateral;   LUMBAR LAMINECTOMY/DECOMPRESSION MICRODISCECTOMY N/A 06/03/2015   Procedure: Bilateral lateral recess decompression L2-3, L3-4, L4-5;  Surgeon:  Lynwood FORBES Better, MD;  Location: [facility] OR;  Service: Orthopedics;  Laterality: N/A;   LUMBAR LAMINECTOMY/DECOMPRESSION MICRODISCECTOMY N/A 10/02/2016   Procedure: Right L5-S1 Lateral Recess Decompression  microdiscectomy;  Surgeon: Better Lynwood FORBES, MD;  Location[facility];  Service: Orthopedics;  Laterality: N/A;   LUMBAR LAMINECTOMY/DECOMPRESSION MICRODISCECTOMY N/A 07/15/2018   Procedure: LEFT L3-4 MICRODISCECTOMY;  Surgeon: Better Lynwood FORBES, MD;  Location: [facility] OR;  Service: Orthopedics;  Laterality: N/A;   svd      x 2   TEAR DUCT PROBING Bilateral 03/01/2019    Procedure: TEAR DUCT PROBING WITH IRRIGATION;  Surgeon: Jacques Sharper, MD;  Location: [facility];  Service: Ophthalmology;  Laterality: Bilateral;   TOTAL HIP ARTHROPLASTY Left 03/14/2019   Procedure: LEFT TOTAL HIP ARTHROPLASTY ANTERIOR APPROACH;  Surgeon: Vernetta Lonni GRADE, MD;  Location: [facility];  Service: Orthopedics;  Laterality: Left;   TOTAL KNEE ARTHROPLASTY Right 09/06/2015   Procedure: RIGHT TOTAL KNEE ARTHROPLASTY;  Surgeon: Lynwood FORBES Better, MD;  Location: [facility] OR;  Service: Orthopedics;  Laterality: Right;   TOTAL KNEE REVISION Right 05/12/2022   Procedure: RIGHT TOTAL KNEE REVISION ARTHROPLASTY;  Surgeon: Vernetta Lonni GRADE, MD;  Location: [facility] OR;  Service: Orthopedics;  Laterality: Right;   TUBAL LIGATION     UPPER GASTROINTESTINAL ENDOSCOPY  04/28/2011   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 tophus 11/30/2023   Constipation 11/30/2023   Diarrhea 11/30/2023   Diverticular disease of colon 11/30/2023   Enteric campylobacteriosis 11/30/2023   Gastroesophageal reflux disease with esophagitis 11/30/2023   Gastroparesis 11/30/2023   Generalized abdominal pain 11/30/2023   Nausea and vomiting 11/30/2023   Arthritis of carpometacarpal [facility]) joint of right thumb 03/26/2023   Carpal tunnel syndrome, right upper limb 03/26/2023   Status post revision of total replacement of right knee 05/12/2022   History of colonic polyps 03/20/2022   Failed total knee, right, subsequent encounter 03/02/2022   Loose right total knee arthroplasty 03/02/2022   Mixed stress and urge urinary incontinence 12/04/2021   Functional fecal incontinence 12/04/2021   IBS (irritable bowel syndrome) 11/19/2021   Spondylolisthesis, lumbar region    Other spondylosis with radiculopathy, lumbar region    Other secondary scoliosis, lumbar region    Fusion of spine of lumbar region 03/25/2021   Colon polyps 06/06/2020   Lupus 06/06/2020   Allergic  rhinitis due to animal (cat) (dog) hair and dander 04/08/2020   Allergic rhinitis due to pollen 04/08/2020   Food allergy  04/08/2020   Acute medial meniscus tear, left, subsequent encounter 03/21/2020   Chronic pain of left knee 02/15/2020   Paresthesia of skin 11/16/2019   History of total knee replacement, right 11/16/2019   Tobacco abuse 11/16/2019   Centrilobular emphysema (HCC) 08/10/2019   Incidental lung nodule, > 3mm and < 8mm 08/10/2019   OSA on CPAP 08/10/2019   Dyspnea on exertion 08/02/2019   Hyperlipidemia 08/02/2019   Lumbar radiculopathy 04/24/2019   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   Post laminectomy syndrome 02/23/2019   Abnormality of gait 02/23/2019   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   Unilateral primary osteoarthritis, left hip 12/28/2018   Lesion of skin of left ear 12/26/2018   Primary osteoarthritis of left hip 12/02/2018   Iron  deficiency anemia 10/16/2018   Chronic pain syndrome 09/08/2018   Chronic pain of right knee 08/11/2018   Status post lumbar laminectomy 07/15/2018   Peripheral arterial disease 04/05/2018   Moderate persistent asthma  without complication 06/29/2017   Environmental and seasonal allergies 06/29/2017   Controlled type 2 diabetes mellitus with diabetic polyneuropathy, without long-term current use of insulin  (HCC) 06/29/2017   Perennial allergic rhinitis 04/08/2017   Sensorineural hearing loss (SNHL), bilateral 04/08/2017   Chronic pansinusitis 03/25/2017   Eustachian tube dysfunction, bilateral 03/25/2017   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   Herniation of lumbar intervertebral disc with radiculopathy 10/02/2016    Class: Chronic   Alopecia areata 08/19/2016   Chondromalacia of both patellae 06/03/2015    Class: Chronic   Spinal stenosis, lumbar region, with neurogenic claudication 06/03/2015   Tobacco use disorder 04/25/2015   DJD (degenerative joint disease) of knee 01/04/2015   Hemorrhoid 11/14/2014   Gout of big toe  07/19/2014   Essential hypertension 08/14/2013   Gastroesophageal reflux disease without esophagitis 08/14/2013   COPD (chronic obstructive pulmonary disease) (HCC) 04/17/2011    ONSET DATE: 11/18/2023  REFERRING DIAG: G56.02 (ICD-10-CM) - Carpal tunnel syndrome, left upper limb  Add'l note per Dr. Erwin: Needs OT 2 weeks s/p left CTR on 12/02/23 NO SPLINT  THERAPY DIAG:  Carpal tunnel syndrome, left upper limb  Pain in left wrist  Pain in left hand  Other lack of coordination  Muscle weakness (generalized)  Other symptoms and signs involving the musculoskeletal system  Other disturbances of skin sensation  Pain in thumb joint with movement of right hand  Pain in joint of right hand  Other symptoms and signs involving the nervous system  Rationale for Evaluation and Treatment: Rehabilitation  SUBJECTIVE:   SUBJECTIVE STATEMENT: Pt reports benefit from IASTM and cupping during last session for RUE.   Pt accompanied by: self  PERTINENT HISTORY: DM2 with neuropathy, previous CTR 02/25 on RUE, HLD, OA, R TKA, emphysema, lumbar fusion, IBS, incontinence of urine, lung nodule, lumbar radiculopathy, spinal stenosis, COPD, gout, and  right thumb carpometacarpal arthroplasty with internal bracing  PRECAUTIONS: None  RED FLAGS: None   WEIGHT BEARING RESTRICTIONS: No  PAIN:  Are you having pain? Yes: NPRS scale: 5/10 Pain location: L hand/wrist Pain description: pt reports it comes and goes  Aggravating factors: movement Relieving factors: Rest, medication  FALLS: Has patient fallen in last 6 months? Yes. Number of falls 3, no injuries   LIVING ENVIRONMENT: Lives with: lives alone Lives in: House/apartment Apartment Stairs: Yes: Internal: 13 steps; on left going up and External: 2 steps; on left going up Has following equipment at home: Walker - 4 wheeled and shower chair with a back  PLOF: Independent  PATIENT GOALS: I want to get back to normal and get my  hand healed up. I want to build the strength in this hand, and be able to do my daily tasks with this hand.   NEXT MD VISIT: 01/12/24 with Dr. Erwin  OBJECTIVE:  Note: Objective measures were completed at Evaluation unless otherwise noted.  HAND DOMINANCE: Left  ADLs: WFL  FUNCTIONAL OUTCOME MEASURES: Quick Dash: 68.2/100  UPPER EXTREMITY ROM:   N/T at eval  UPPER EXTREMITY MMT:   N/T at eval  HAND FUNCTION: Grip strength: Right: 61 lbs; Left: NT lbs  COORDINATION: (pt's nails were long and untrimmed at time of testing) 9 Hole Peg test: Right: 41.46 sec; Left: 33.50 sec Box and Blocks:  Right 49 blocks, Left 54 blocks  SENSATION: WFL  EDEMA: Pt reports it was swollen before, but is elevating it whenever possible.   COGNITION: Overall cognitive status: Within functional limits for tasks assessed   OBSERVATIONS: Pain in  L hand and wrist, healing surgical area, affecting ability to complete ADL/IADL   TREATMENT:                                                                                                                            - Self-care/home management completed for duration as noted below including: OT educated pt on scar massage; use of heat as noted in pt instructions for pain management.   - Manual therapy completed for duration as noted below including:  Therapist completed IASTM using edge mobility tool at site of R thumb incision secondary to reported paresthesias and observed pitting using free up lotion as emollient for reduction of scar tissue and to promote improved AROM and pain reduction of affected extremity. Improved appearance to incision noted upon completion with less palpable adhesions. - Ultrasound completed for duration as noted below including:  Ultrasound applied to palmar left hand, digits, and wrist for 8 minutes, frequency of 3 MHz, 20% duty cycle, and 1.1 W/cm with pt's arm placed on soft towel for promotion of ROM, edema reduction, and  pain reduction in affected extremity.   PATIENT EDUCATION: Education details: SEE ABOVE Person educated: Patient Education method: Programmer, Multimedia, Demonstration, Verbal cues, and Handouts Education comprehension: verbalized understanding, returned demonstration, verbal cues required, and needs further education  HOME EXERCISE PROGRAM: 12/17/23: IHP HEP s/p CTR, sleep positioning 12/24/23: joint protection, energy conservation 01/06/2024: heat; scar massage  GOALS: Goals reviewed with patient? Yes  SHORT TERM GOALS: Target date: 01/16/24  Pt will be independent with HEP for wrist strengthening and hand strengthening Baseline: New to OP OT Goal status: IN PROGRES  2.  Pt will be independent with scar tissue massage for L hand/wrist Baseline: New to OP OT Goal status: IN PROGRESS  3.  Pt will verbalize at least 2 joint protection strategies to reduce re-inflammation of L wrist and hand Baseline: New to OP OT Goal status:IN PROGRESS  4.  Pt will report reduced pain in ADL completion to no more than 2/10  Baseline: 6/10 Goal status: INITIAL    LONG TERM GOALS: Target date: 02/18/24  Patient will demonstrate at least 25% improvement with quick Dash score (reporting 43.2% disability or less) indicating improved functional use of affected extremity.  Baseline: Quick Dash: 68.2/100 Goal status: INITIAL  2.  OT will assess grip strength of L hand and make goal as appropriate. Baseline: New to OP OT Goal status: INITIAL  3.  Pt will be educated in AE/compnsatory strategies to promote ease and reduced pain in ADL/IADL completion Baseline: New to OP OT Goal status: INITIAL  ASSESSMENT:  CLINICAL IMPRESSION: Patient presents to occupational therapy s/p L CTR with history of R CTR and thumb carpometacarpal arthroplasty with internal bracing (03/26/2023). Good tolerance to therapy today. Will continue to progress as able.   PERFORMANCE DEFICITS: in functional skills including ADLs,  IADLs, strength, pain, and UE functional use, cognitive skills including safety awareness, and psychosocial skills including coping  strategies and environmental adaptation.   IMPAIRMENTS: are limiting patient from ADLs, IADLs, rest and sleep, and leisure.   COMORBIDITIES: may have co-morbidities  that affects occupational performance. Patient will benefit from skilled OT to address above impairments and improve overall function.  REHAB POTENTIAL: Good  PLAN:  OT FREQUENCY: 1x/week  OT DURATION: 8 weeks  PLANNED INTERVENTIONS: 97168 OT Re-evaluation, 97535 self care/ADL training, 02889 therapeutic exercise, 97530 therapeutic activity, 97140 manual therapy, 97035 ultrasound, 97018 paraffin, 02960 fluidotherapy, 97010 moist heat, 97010 cryotherapy, scar mobilization, passive range of motion, energy conservation, coping strategies training, patient/family education, and DME and/or AE instructions  RECOMMENDED OTHER SERVICES: none  CONSULTED AND AGREED WITH PLAN OF CARE: Patient  PLAN FOR NEXT SESSION: Monitor scar area Scar massage Modalities (heat, ultrasound, fluido) if surgical area is closed AE recommendations  Obtain grip strength when scar is closed over  DOS 12/02/23-DOT 12/24/23: 3 weeks post surgery this date  For all possible CPT codes, reference the Planned Interventions line above.     Check all conditions that are expected to impact treatment: {Conditions expected to impact treatment:Diabetes mellitus, Musculoskeletal disorders, and Active major medical illness (Lupus)  If treatment provided at initial evaluation, no treatment charged due to lack of authorization.       Jocelyn CHRISTELLA Bottom, OT 01/06/2024, 9:04 AM

## 2024-01-08 ENCOUNTER — Other Ambulatory Visit: Payer: Self-pay | Admitting: Internal Medicine

## 2024-01-08 DIAGNOSIS — F419 Anxiety disorder, unspecified: Secondary | ICD-10-CM

## 2024-01-08 DIAGNOSIS — M109 Gout, unspecified: Secondary | ICD-10-CM

## 2024-01-11 NOTE — Progress Notes (Unsigned)
   Stacy [name] - 63 y.o. female MRN [id number]  Date of birth: 1961/01/04  Office Visit Note: Visit Date: 01/12/2024 PCP: Vicci Barnie NOVAK, MD Referred by: Vicci Barnie NOVAK, MD  Subjective:  HPI: Stacy [name] is a 63 y.o. female who presents today for follow up 6 weeks status post left wrist open carpal tunnel release .  Pertinent ROS were reviewed with the patient and found to be negative unless otherwise specified above in HPI.   Assessment & Plan: Visit Diagnoses: No diagnosis found.  Plan: ***  Follow-up: No follow-ups on file.   Meds & Orders: No orders of the defined types were placed in this encounter.  No orders of the defined types were placed in this encounter.    Procedures: No procedures performed       Objective:   Vital Signs: LMP 09/01/2010   Ortho Exam ***  Imaging: No results found.   Mosetta Ferdinand Afton Alderton, M.D. [facility], Hand Surgery

## 2024-01-12 ENCOUNTER — Ambulatory Visit: Admitting: Orthopedic Surgery

## 2024-01-12 DIAGNOSIS — Z9889 Other specified postprocedural states: Secondary | ICD-10-CM

## 2024-01-13 ENCOUNTER — Ambulatory Visit: Attending: Internal Medicine | Admitting: Internal Medicine

## 2024-01-13 ENCOUNTER — Encounter: Payer: Self-pay | Admitting: Internal Medicine

## 2024-01-13 VITALS — BP 106/68 | HR 70 | Temp 98.4°F | Ht 66.0 in | Wt 153.0 lb

## 2024-01-13 DIAGNOSIS — E1142 Type 2 diabetes mellitus with diabetic polyneuropathy: Secondary | ICD-10-CM | POA: Diagnosis not present

## 2024-01-13 DIAGNOSIS — D649 Anemia, unspecified: Secondary | ICD-10-CM | POA: Diagnosis not present

## 2024-01-13 DIAGNOSIS — Z7984 Long term (current) use of oral hypoglycemic drugs: Secondary | ICD-10-CM

## 2024-01-13 DIAGNOSIS — E785 Hyperlipidemia, unspecified: Secondary | ICD-10-CM

## 2024-01-13 DIAGNOSIS — J449 Chronic obstructive pulmonary disease, unspecified: Secondary | ICD-10-CM | POA: Diagnosis not present

## 2024-01-13 DIAGNOSIS — I739 Peripheral vascular disease, unspecified: Secondary | ICD-10-CM

## 2024-01-13 DIAGNOSIS — I152 Hypertension secondary to endocrine disorders: Secondary | ICD-10-CM

## 2024-01-13 DIAGNOSIS — Z23 Encounter for immunization: Secondary | ICD-10-CM

## 2024-01-13 DIAGNOSIS — E119 Type 2 diabetes mellitus without complications: Secondary | ICD-10-CM

## 2024-01-13 DIAGNOSIS — I10 Essential (primary) hypertension: Secondary | ICD-10-CM

## 2024-01-13 DIAGNOSIS — Z8739 Personal history of other diseases of the musculoskeletal system and connective tissue: Secondary | ICD-10-CM

## 2024-01-13 DIAGNOSIS — E1169 Type 2 diabetes mellitus with other specified complication: Secondary | ICD-10-CM

## 2024-01-13 LAB — POCT GLYCOSYLATED HEMOGLOBIN (HGB A1C): HbA1c, POC (controlled diabetic range): 7 % (ref 0.0–7.0)

## 2024-01-13 MED ORDER — FARXIGA 10 MG PO TABS: 10.0000 mg | ORAL_TABLET | Freq: Every day | ORAL | 2 refills | Status: AC

## 2024-01-13 NOTE — Progress Notes (Signed)
 Patient ID: Stacy [name] [facility], female    DOB: Dec 31, 1960  MRN: [id number]  CC: Diabetes (DM f/u. Med refills. /)   Subjective: Stacy [name] is a 63 y.o. female who presents for chronic ds management. 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  History of Present Illness Stacy [name] is a 63 year old female who presents for follow-up of her chronic medical conditions.  HTN: She is managing her hypertension with Diovan  hydrochlorothiazide  160/12.5 mg once daily and a potassium supplement 20 mEq daily. Her blood pressure typically remains with a systolic less than 130 and a diastolic between 76 and 80. She checks her blood pressure three times a day. No swelling in the lower legs and she limits salt intake in her diet.  DM: Results for orders placed or performed in visit on 01/13/24  POCT glycosylated hemoglobin (Hb A1C)   Collection Time: 01/13/24  8:50 AM  Result Value Ref Range   Hemoglobin A1C     HbA1c POC (<> result, manual entry)     HbA1c, POC (prediabetic range)     HbA1c, POC (controlled diabetic range) 7.0 0.0 - 7.0 %   *Note: Due to a large number of results and/or encounters for the requested time period, some results have not been displayed. A complete set of results can be found in Results Review.   Her diabetes is managed with Farxiga  10 mg daily and metformin  750 mg once daily. Her A1c today is 7.0, consistent with her last visit. She monitors her blood sugar twice daily, with morning readings ranging from 111 to 119 and evening readings from  112 to 115. She maintains portion control and avoids sugary snacks and drinks. Her weight has remained stable at 153 lbs since August. She engages in physical activity by walking for over 30 minutes twice a week and occasionally climbs stairs. She drinks only water and avoids sugary drinks.  HL/PAD: For hyperlipidemia and peripheral artery disease, she was switched from atorvastatin  to rosuvastatin  (Crestor ) 20 mg, which she tolerates better. She continues to take clopidogrel  (Plavix ) for peripheral artery disease.  Her COPD is well-controlled with Symbicort  and Spiriva . She continues to use her CPAP and wakes up feeling refreshed. No recent COPD flare-ups, increased cough, or shortness of breath. She experiences occasional shortness of breath when climbing stairs.  Anemia: She has a history of mild but stable anemia, noted in blood tests from September. She was in ER at that time and  received fluids.  She reports a recent issue with her insurance, where [facility] is incorrectly showing up as her provider, affecting her ability to receive incontinence supplies from [employer]. She has contacted her Medicaid [employer] to try to resolve this issue.  She underwent carpal tunnel surgery on LT hand, which has healed well. She no longer sees Dr. Dasie for allergy  shots.  .     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 tophus 11/30/2023   Constipation 11/30/2023   Diarrhea 11/30/2023   Diverticular disease of colon 11/30/2023   Enteric campylobacteriosis 11/30/2023  Gastroesophageal reflux disease with esophagitis 11/30/2023   Gastroparesis 11/30/2023   Generalized abdominal pain 11/30/2023   Nausea and vomiting 11/30/2023   Arthritis of carpometacarpal [facility]) joint of right thumb 03/26/2023   Carpal tunnel syndrome, right upper limb 03/26/2023   Status post revision of total replacement of right knee 05/12/2022   History of colonic polyps 03/20/2022    Failed total knee, right, subsequent encounter 03/02/2022   Loose right total knee arthroplasty 03/02/2022   Mixed stress and urge urinary incontinence 12/04/2021   Functional fecal incontinence 12/04/2021   IBS (irritable bowel syndrome) 11/19/2021   Spondylolisthesis, lumbar region    Other spondylosis with radiculopathy, lumbar region    Other secondary scoliosis, lumbar region    Fusion of spine of lumbar region 03/25/2021   Colon polyps 06/06/2020   Lupus 06/06/2020   Allergic rhinitis due to animal (cat) (dog) hair and dander 04/08/2020   Allergic rhinitis due to pollen 04/08/2020   Food allergy  04/08/2020   Acute medial meniscus tear, left, subsequent encounter 03/21/2020   Chronic pain of left knee 02/15/2020   Paresthesia of skin 11/16/2019   History of total knee replacement, right 11/16/2019   Tobacco abuse 11/16/2019   Centrilobular emphysema (HCC) 08/10/2019   Incidental lung nodule, > 3mm and < 8mm 08/10/2019   OSA on CPAP 08/10/2019   Dyspnea on exertion 08/02/2019   Hyperlipidemia 08/02/2019   Lumbar radiculopathy 04/24/2019   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   Post laminectomy syndrome 02/23/2019   Abnormality of gait 02/23/2019   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   Unilateral primary osteoarthritis, left hip 12/28/2018   Lesion of skin of left ear 12/26/2018   Primary osteoarthritis of left hip 12/02/2018   Iron  deficiency anemia 10/16/2018   Chronic pain syndrome 09/08/2018   Chronic pain of right knee 08/11/2018   Status post lumbar laminectomy 07/15/2018   Peripheral arterial disease 04/05/2018   Moderate persistent asthma without complication 06/29/2017   Environmental and seasonal allergies 06/29/2017   Controlled type 2 diabetes mellitus with diabetic polyneuropathy, without long-term current use of insulin  (HCC) 06/29/2017   Perennial allergic rhinitis 04/08/2017   Sensorineural hearing loss (SNHL), bilateral 04/08/2017   Chronic  pansinusitis 03/25/2017   Eustachian tube dysfunction, bilateral 03/25/2017   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   Herniation of lumbar intervertebral disc with radiculopathy 10/02/2016    Class: Chronic   Alopecia areata 08/19/2016   Chondromalacia of both patellae 06/03/2015    Class: Chronic   Spinal stenosis, lumbar region, with neurogenic claudication 06/03/2015   Tobacco use disorder 04/25/2015   DJD (degenerative joint disease) of knee 01/04/2015   Hemorrhoid 11/14/2014   Gout of big toe 07/19/2014   Essential hypertension 08/14/2013   Gastroesophageal reflux disease without esophagitis 08/14/2013   COPD (chronic obstructive pulmonary disease) (HCC) 04/17/2011     Medications Ordered Prior to Encounter[1]  Allergies[2]  Social History   Socioeconomic History   Marital status: Married    Spouse name: Not on file   Number of children: 2   Years of education: Not on file   Highest education level: Not on file  Occupational History   Occupation: unemployed    Employer: UNEMPLOYED  Tobacco Use   Smoking status: Former   Smokeless tobacco: Never  Advertising Account Planner   Vaping status: Never Used  Substance and Sexual Activity   Alcohol use: No   Drug use: No   Sexual activity: Yes    Birth control/protection: Surgical, Post-menopausal  Comment: tubal ligation  Other Topics Concern   Not on file  Social History Narrative   Left Handed    Lives in a two story apartment. With husband   Drinks Caffeine 1 cup   retired   Social Drivers of Health   Tobacco Use: Medium Risk (11/30/2023)   Patient History    Smoking Tobacco Use: Former    Smokeless Tobacco Use: Never    Passive Exposure: Not on file  Financial Resource Strain: Medium Risk (01/08/2023)   Overall Financial Resource Strain (CARDIA)    Difficulty of Paying Living Expenses: Somewhat hard  Food Insecurity: No Food Insecurity (12/20/2023)   Epic    Worried About Programme Researcher, Broadcasting/film/video in the Last Year: Never true     Ran Out of Food in the Last Year: Never true  Transportation Needs: No Transportation Needs (12/20/2023)   Epic    Lack of Transportation (Medical): No    Lack of Transportation (Non-Medical): No  Physical Activity: Sufficiently Active (01/08/2023)   Exercise Vital Sign    Days of Exercise per Week: 7 days    Minutes of Exercise per Session: 30 min  Stress: Stress Concern Present (01/08/2023)   [employer] of Occupational Health - Occupational Stress Questionnaire    Feeling of Stress : Rather much  Social Connections: Socially Integrated (01/08/2023)   Social Connection and Isolation Panel    Frequency of Communication with Friends and Family: Twice a week    Frequency of Social Gatherings with Friends and Family: More than three times a week    Attends Religious Services: 1 to 4 times per year    Active Member of Clubs or Organizations: Yes    Attends Banker Meetings: Never    Marital Status: Married  Catering Manager Violence: Not At Risk (12/20/2023)   Epic    Fear of Current or Ex-Partner: No    Emotionally Abused: No    Physically Abused: No    Sexually Abused: No  Depression (PHQ2-9): Low Risk (12/20/2023)   Depression (PHQ2-9)    PHQ-2 Score: 0  Alcohol Screen: Low Risk (01/08/2023)   Alcohol Screen    Last Alcohol Screening Score (AUDIT): 0  Housing: Low Risk (12/20/2023)   Epic    Unable to Pay for Housing in the Last Year: No    Number of Times Moved in the Last Year: 0    Homeless in the Last Year: No  Utilities: Not At Risk (12/20/2023)   Epic    Threatened with loss of utilities: No  Health Literacy: Inadequate Health Literacy (01/08/2023)   B1300 Health Literacy    Frequency of need for help with medical instructions: Sometimes    Family History  Problem Relation Age of Onset   Hypertension Father    Cancer Father    Heart disease Mother    Asthma Son        had as a child   Heart disease Sister    Breast cancer Sister     Hypertension Brother     Past Surgical History:  Procedure Laterality Date   BACK SURGERY     feb 21, 23   CARPAL TUNNEL RELEASE Right 03/26/2023   Procedure: RIGHT CARPAL TUNNEL RELEASE;  Surgeon: Arlinda Buster, MD;  Location: [facility];  Service: Orthopedics;  Laterality: Right;   CARPOMETACARPEL SUSPENSION PLASTY Right 03/26/2023   Procedure: RIGHT THUMB CARPOMETACARPEL (CMC)  ARTHROPLASTY WITH INTERNAL BRACE;  Surgeon: Arlinda Buster, MD;  Location:  [facility];  Service: Orthopedics;  Laterality: Right;   CHOLECYSTECTOMY     COLONOSCOPY     ENDOMETRIAL ABLATION  10/2010   HERNIA REPAIR     umbicial hernia   JOINT REPLACEMENT Left 03/14/2019   Dr. Vernetta hip   KNEE ARTHROSCOPY Left    06/07/2017 Dr. Addie of [facility]   KNEE ARTHROSCOPY Left 03/21/2020   Procedure: LEFT KNEE ARTHROSCOPY WITH PARTIAL MEDIAL MENISCECTOMY;  Surgeon: Vernetta Lonni GRADE, MD;  Location: [facility];  Service: Orthopedics;  Laterality: Left;   KNEE CLOSED REDUCTION Right 12/06/2015   Procedure: CLOSED MANIPULATION RIGHT KNEE;  Surgeon: Lynwood FORBES Better, MD;  Location: [facility];  Service: Orthopedics;  Laterality: Right;   KNEE CLOSED REDUCTION Right 01/17/2016   Procedure: CLOSED MANIPULATION RIGHT KNEE;  Surgeon: Lynwood FORBES Better, MD;  Location: [facility];  Service: Orthopedics;  Laterality: Right;   KNEE JOINT MANIPULATION Right 12/06/2015   LACRIMAL TUBE INSERTION Bilateral 03/01/2019   Procedure: LACRIMAL TUBE INSERTION;  Surgeon: Jacques Sharper, MD;  Location[facility];  Service: Ophthalmology;  Laterality: Bilateral;   LACRIMAL TUBE REMOVAL Bilateral 05/03/2019   Procedure: BILATERAL NASOLACRIMAL DUCT PROBING, IIRIGATION AND TUBE REMOVAL BOTH EYES;  Surgeon: Jacques Sharper, MD;  Location: [facility];  Service: Ophthalmology;  Laterality: Bilateral;   LUMBAR LAMINECTOMY/DECOMPRESSION MICRODISCECTOMY N/A 06/03/2015    Procedure: Bilateral lateral recess decompression L2-3, L3-4, L4-5;  Surgeon: Lynwood FORBES Better, MD;  Location: [facility];  Service: Orthopedics;  Laterality: N/A;   LUMBAR LAMINECTOMY/DECOMPRESSION MICRODISCECTOMY N/A 10/02/2016   Procedure: Right L5-S1 Lateral Recess Decompression  microdiscectomy;  Surgeon: Better Lynwood FORBES, MD;  Location: [facility];  Service: Orthopedics;  Laterality: N/A;   LUMBAR LAMINECTOMY/DECOMPRESSION MICRODISCECTOMY N/A 07/15/2018   Procedure: LEFT L3-4 MICRODISCECTOMY;  Surgeon: Better Lynwood FORBES, MD;  Location: [facility];  Service: Orthopedics;  Laterality: N/A;   svd      x 2   TEAR DUCT PROBING Bilateral 03/01/2019   Procedure: TEAR DUCT PROBING WITH IRRIGATION;  Surgeon: Jacques Sharper, MD;  Location: [facility];  Service: Ophthalmology;  Laterality: Bilateral;   TOTAL HIP ARTHROPLASTY Left 03/14/2019   Procedure: LEFT TOTAL HIP ARTHROPLASTY ANTERIOR APPROACH;  Surgeon: Vernetta Lonni GRADE, MD;  Location: [facility];  Service: Orthopedics;  Laterality: Left;   TOTAL KNEE ARTHROPLASTY Right 09/06/2015   Procedure: RIGHT TOTAL KNEE ARTHROPLASTY;  Surgeon: Lynwood FORBES Better, MD;  Location: [facility];  Service: Orthopedics;  Laterality: Right;   TOTAL KNEE REVISION Right 05/12/2022   Procedure: RIGHT TOTAL KNEE REVISION ARTHROPLASTY;  Surgeon: Vernetta Lonni GRADE, MD;  Location: [facility];  Service: Orthopedics;  Laterality: Right;   TUBAL LIGATION     UPPER GASTROINTESTINAL ENDOSCOPY  04/28/2011    ROS: Review of Systems Negative except as stated above  PHYSICAL EXAM: BP 134/76 (BP Location: Left Arm, Patient Position: Sitting, Cuff Size: Normal)   Pulse 70   Temp 98.4 F (36.9 C) (Oral)   Ht 5' 6 (1.676 m)   Wt 153 lb (69.4 kg)   LMP 09/01/2010   SpO2 97%   BMI 24.69 kg/m   Wt Readings from Last 3 Encounters:  01/13/24 153 lb (69.4 kg)  11/17/23 146 lb 6.4 oz (66.4 kg)  10/28/23 152 lb (68.9 kg)    Physical Exam  General appearance - alert, well appearing,  older AAF and in no distress Mental status - normal mood, behavior, speech, dress, motor activity, and thought processes Neck -  supple, no significant adenopathy Mouth: edentulous Chest - clear to auscultation, no wheezes, rales or rhonchi, symmetric air entry Heart - normal rate, regular rhythm, normal S1, S2, no murmurs, rubs, clicks or gallops Extremities - peripheral pulses normal, no pedal edema, no clubbing or cyanosis      Latest Ref Rng & Units 10/27/2023   11:50 AM 06/09/2023    8:32 AM 03/19/2023   12:30 PM  CMP  Glucose 70 - 99 mg/dL 887  878  868   BUN 8 - 23 mg/dL 5  11  9    Creatinine 0.44 - 1.00 mg/dL 9.14  9.00  9.11   Sodium 135 - 145 mmol/L 139  138  139   Potassium 3.5 - 5.1 mmol/L 3.3  4.3  3.7   Chloride 98 - 111 mmol/L 104  99  101   CO2 22 - 32 mmol/L 24  21  24    Calcium  8.9 - 10.3 mg/dL 9.2  9.9  9.9   Total Protein 6.5 - 8.1 g/dL 7.2  7.2    Total Bilirubin 0.0 - 1.2 mg/dL 0.7  0.2    Alkaline Phos 38 - 126 U/L 105  136    AST 15 - 41 U/L 19  14    ALT 0 - 44 U/L 15  10     Lipid Panel     Component Value Date/Time   CHOL 163 06/09/2023 0832   TRIG 196 (H) 06/09/2023 0832   HDL 46 06/09/2023 0832   CHOLHDL 3.5 06/09/2023 0832   CHOLHDL 4.2 07/19/2014 1202   VLDL 24 07/19/2014 1202   LDLCALC 84 06/09/2023 0832    CBC    Component Value Date/Time   WBC 7.1 10/27/2023 1150   RBC 4.24 10/27/2023 1150   HGB 11.9 (L) 10/27/2023 1150   HGB 11.3 05/01/2021 1526   HCT 35.3 (L) 10/27/2023 1150   HCT 34.7 05/01/2021 1526   PLT 300 10/27/2023 1150   PLT 297 05/01/2021 1526   MCV 83.3 10/27/2023 1150   MCV 81 05/01/2021 1526   MCH 28.1 10/27/2023 1150   MCHC 33.7 10/27/2023 1150   RDW 14.2 10/27/2023 1150   RDW 15.7 (H) 05/01/2021 1526   LYMPHSABS 2.3 10/27/2023 1150   LYMPHSABS 4.1 (H) 10/14/2018 1704   MONOABS 0.6 10/27/2023 1150   EOSABS 0.1 10/27/2023 1150   EOSABS 0.1 10/14/2018 1704   BASOSABS 0.0 10/27/2023 1150   BASOSABS 0.0  10/14/2018 1704    ASSESSMENT AND PLAN: 1. Type 2 diabetes mellitus with diabetic polyneuropathy, without long-term current use of insulin  (HCC) (Primary) Close to goal A1c stable at 7.0. Blood glucose well-controlled. Weight stable. - Continue Farxiga  10 mg daily. - Continue metformin  750 mg daily. - Encouraged mindful eating and portion control especially during the holidaya. - POCT glycosylated hemoglobin (Hb A1C) - FARXIGA  10 MG TABS tablet; Take 1 tablet (10 mg total) by mouth daily before breakfast.  Dispense: 90 tablet; Refill: 2 - metFORMIN  (GLUCOPHAGE -XR) 750 MG 24 hr tablet; Take 1 tablet (750 mg total) by mouth daily with breakfast.  Dispense: 90 tablet; Refill: 1 - Microalbumin / creatinine urine ratio  2. Diabetes mellitus treated with oral medication (HCC) See #1 above  3. Hypertension associated with type 2 diabetes mellitus (HCC) At goal continue Diovan  (valsartan  hydrochlorothiazide ) 160/12.5 mg daily, and she takes a potassium supplement.  - potassium chloride  SA (KLOR-CON  M) 20 MEQ tablet; Take 1 tablet (20 mEq total) by mouth daily.  Dispense: 90 tablet;  Refill: 1 - Basic Metabolic Panel  4. Hyperlipidemia associated with type 2 diabetes mellitus (HCC) Continue Crestor . Recheck Lipid on subsequent visit  5. PAD (peripheral artery disease) Stable. Continue Crestor  and Plavix  - clopidogrel  (PLAVIX ) 75 MG tablet; Take 1 tablet (75 mg total) by mouth daily.  Dispense: 90 tablet; Refill: 2  6. Chronic obstructive pulmonary disease, unspecified COPD type (HCC) stable Well-managed with Symbicort  and Spiriva .   7. History of gout RF given on Allopurinol  - allopurinol  (ZYLOPRIM ) 100 MG tablet; TAKE 1 TABLET (100 MG TOTAL) BY MOUTH DAILY.(AM)  Dispense: 90 tablet; Refill: 1  8. Normocytic anemia - CBC - Iron , TIBC and Ferritin Panel  9. Need for immunization against influenza - Flu vaccine trivalent PF, 6mos and  older(Flulaval,Afluria,Fluarix,Fluzone)     Patient was given the opportunity to ask questions.  Patient verbalized understanding of the plan and was able to repeat key elements of the plan.   This documentation was completed using Paediatric nurse.  Any transcriptional errors are unintentional.  Orders Placed This Encounter  Procedures   POCT glucose (manual entry)   POCT glycosylated hemoglobin (Hb A1C)     Requested Prescriptions    No prescriptions requested or ordered in this encounter    No follow-ups on file.  Barnie Louder, MD, FACP     [1]  Current Outpatient Medications on File Prior to Visit  Medication Sig Dispense Refill   Accu-Chek Softclix Lancets lancets USE AS INSTRUCTED 3 TIMES A DAY 100 each 1   albuterol  (PROVENTIL ) (2.5 MG/3ML) 0.083% nebulizer solution USE ONE VIAL FOR UP TO 3 TIMES DAILY. 360 mL 2   allopurinol  (ZYLOPRIM ) 100 MG tablet TAKE 1 TABLET (100 MG TOTAL) BY MOUTH DAILY.(AM) 90 tablet 1   benzonatate  (TESSALON ) 200 MG capsule Take 1 capsule (200 mg total) by mouth 2 (two) times daily as needed for cough. 20 capsule 0   Blood Glucose Monitoring Suppl (ACCU-CHEK AVIVA PLUS) w/Device KIT UAD to check blood sugars. 1 kit 0   celecoxib  (CELEBREX ) 200 MG capsule Take 1 capsule (200 mg total) by mouth daily. 30 capsule 3   clopidogrel  (PLAVIX ) 75 MG tablet TAKE 1 TABLET (75 MG TOTAL) BY MOUTH DAILY. (AM) 90 tablet 1   diclofenac  Sodium (VOLTAREN ) 1 % GEL Apply 2 g topically 4 (four) times daily. 350 g 5   DULoxetine  (CYMBALTA ) 60 MG capsule TAKE 1 CAPSULE (60 MG TOTAL) BY MOUTH DAILY (AM) 90 capsule 1   EPINEPHrine  0.3 mg/0.3 mL IJ SOAJ injection 0.3 mg IM x 1 PRN for allergic reaction 1 each 1   FARXIGA  10 MG TABS tablet TAKE 1 TABLET (10 MG TOTAL) BY MOUTH DAILY BEFORE BREAKFAST. 90 tablet 1   [name] 325 (65 Fe) MG tablet TAKE ONE TABLET ( 325 MG ) BY MOUTH DAILY (AM) 100 tablet 0   fluticasone  (FLONASE ) 50 MCG/ACT nasal spray  PLACE 1 SPRAY INTO BOTH NOSTRILS DAILY. 16 g 0   glucose blood (ACCU-CHEK AVIVA PLUS) test strip USE AS INSTRUCTED 3 TIMES A DAY. E11.9, Z79.84 100 each 0   glycopyrrolate  (ROBINUL ) 2 MG tablet Take 2 mg by mouth 3 (three) times daily.     GVOKE PFS  1 MG/0.2ML SOSY USE ONE SYRINGE (1 MG) UNDER THE SKIN AS NEEDED FOR SEVERE SYMPTOMATIC LOW BLOOD SUGAR 0.2 mL 0   hydrOXYzine  (ATARAX ) 10 MG tablet TAKE 1 TABLET IN THE MORNING, THEN 1 TABLET AT NOON AND 2 TABLETS IN THE EVENING AS NEEDED. 120 tablet 1  Lancets (ACCU-CHEK SOFT TOUCH) lancets Use as instructed 100 each 12   loratadine  (CLARITIN ) 10 MG tablet TAKE 1 TABLET (10 MG TOTAL) BY MOUTH DAILY AS NEEDED FOR ALLERGIES. (AM) 90 tablet 1   melatonin 5 MG TABS Take 5 mg by mouth at bedtime.     metFORMIN  (GLUCOPHAGE -XR) 750 MG 24 hr tablet TAKE 1 TABLET (750 MG TOTAL) BY MOUTH DAILY WITH BREAKFAST. 90 tablet 1   methocarbamol  (ROBAXIN ) 500 MG tablet TAKE 1 TABLET (500 MG TOTAL) BY MOUTH EVERY 6 (SIX) HOURS AS NEEDED FOR MUSCLE SPASMS. 40 tablet 1   Methylnaltrexone  Bromide 8 MG/0.4ML SOLN Take 0.4 mLs by mouth as needed. Take once daily as needed 2.1 mL 0   montelukast  (SINGULAIR ) 10 MG tablet Take 10 mg by mouth daily.     Multiple Vitamins-Minerals (CENTRUM SILVER 50+WOMEN PO) Take 1 tablet by mouth daily.     ondansetron  (ZOFRAN -ODT) 4 MG disintegrating tablet Take 1 tablet (4 mg total) by mouth every 8 (eight) hours as needed for nausea or vomiting. 20 tablet 0   oxyCODONE  (ROXICODONE ) 5 MG immediate release tablet Take 1 tablet (5 mg total) by mouth every 6 (six) hours as needed. 15 tablet 0   pantoprazole  (PROTONIX ) 40 MG tablet Take 40 mg by mouth daily.     potassium chloride  SA (KLOR-CON  M) 20 MEQ tablet TAKE 1 TABLET (20 MEQ TOTAL) BY MOUTH DAILY.(NOON ) 90 tablet 1   Respiratory Therapy Supplies (FLUTTER) DEVI Use after breathing treatment 4 times daily 1 each 0   rosuvastatin  (CRESTOR ) 20 MG tablet Take 1 tablet (20 mg total) by mouth  daily. Stop Atorvastatin  90 tablet 3   SPIRIVA  HANDIHALER 18 MCG inhalation capsule Place 1 capsule (18 mcg total) into inhaler and inhale daily. 90 capsule 3   SYMBICORT  160-4.5 MCG/ACT inhaler Inhale 1 puff into the lungs 2 (two) times daily.     triamcinolone  cream (KENALOG ) 0.1 % Apply 1 Application topically 2 (two) times daily as needed (irritation). 45 g 1   valsartan -hydrochlorothiazide  (DIOVAN -HCT) 160-12.5 MG tablet TAKE ONE TABLET BY MOUTH ONCE DAILY (AM) 90 tablet 1   [DISCONTINUED] Fluticasone -Salmeterol (ADVAIR) 500-50 MCG/DOSE AEPB Inhale 1 puff into the lungs every 12 (twelve) hours.       [DISCONTINUED] lisinopril (PRINIVIL,ZESTRIL) 40 MG tablet Take 40 mg by mouth daily.   (Patient not taking: Reported on 05/10/2023)     Current Facility-Administered Medications on File Prior to Visit  Medication Dose Route Frequency Provider Last Rate Last Admin   glucose chewable tablet 12 g  3 tablet Oral Once Vicci Barnie NOVAK, MD      [2]  Allergies Allergen Reactions   Other Shortness Of Breath    UNSPECIFIED AGENTS Allergic to perfumes and cleaning products   Shellfish Allergy  Anaphylaxis    Per allergy  test.   Ace Inhibitors Cough and Other (See Comments)        Celebrex  [Celecoxib ]      upset stomach  Pt is taking med*   Shellfish Protein-Containing Drug Products Other (See Comments)   Aspirin  Nausea Only and Other (See Comments)    stomach upset  Other reaction(s): Nausea Only

## 2024-01-13 NOTE — Patient Instructions (Signed)
°  VISIT SUMMARY: Today, you had a follow-up appointment to review your chronic medical conditions. We discussed your hypertension, diabetes, hyperlipidemia, peripheral artery disease, COPD, and anemia. Your blood pressure and blood sugar levels are well-controlled, and your weight has remained stable. We also addressed your recent insurance issue and reviewed your current medications and lifestyle habits.  YOUR PLAN: -TYPE 2 DIABETES MELLITUS: Type 2 diabetes is a condition where your body does not use insulin  properly, leading to high blood sugar levels. Your A1c is stable at 7.0, and your blood glucose levels are well-controlled. Continue taking Farxiga  10 mg daily and metformin  750 mg daily. We have refilled your prescriptions for these medications. Keep practicing mindful eating and portion control.  -PERIPHERAL ARTERY DISEASE: Peripheral artery disease is a condition where the blood vessels outside your heart become narrowed, reducing blood flow to your limbs. You are tolerating rosuvastatin  well, so continue taking rosuvastatin  20 mg daily and clopidogrel  as prescribed.  -CHRONIC OBSTRUCTIVE PULMONARY DISEASE (COPD): COPD is a chronic lung disease that makes it hard to breathe. Your condition is well-managed with Symbicort  and Spiriva , and your CPAP is effective. Continue using Symbicort  and Spiriva  as prescribed. Use your inhaler before climbing stairs and try to walk for exercise three times a week.  -GOUT: Gout is a form of arthritis characterized by sudden, severe attacks of pain, redness, and tenderness in joints. You have not had any recent flare-ups, so continue with your current gout management regimen.  -NORMOCYTIC ANEMIA: Normocytic anemia is a condition where you have a lower than normal number of red blood cells, but the cells are of normal size. We have ordered blood tests to recheck your blood cell count and iron  levels.  -GENERAL HEALTH MAINTENANCE: We have ordered a urine test to  check for protein and blood tests to recheck your anemia and iron  levels. Continue regular exercise and maintain healthy lifestyle choices.  INSTRUCTIONS: Please follow up with the ordered blood tests to recheck your blood cell count and iron  levels, and the urine test to check for protein. Continue taking your medications as prescribed and maintain your current lifestyle habits. If you experience any new symptoms or have concerns, please contact our office.                      Contains text generated by [employer]bridge.                                 Contains text generated by [employer]bridge.

## 2024-01-14 ENCOUNTER — Ambulatory Visit

## 2024-01-14 ENCOUNTER — Ambulatory Visit: Payer: Self-pay | Admitting: Internal Medicine

## 2024-01-14 DIAGNOSIS — G5602 Carpal tunnel syndrome, left upper limb: Secondary | ICD-10-CM

## 2024-01-14 DIAGNOSIS — M25532 Pain in left wrist: Secondary | ICD-10-CM

## 2024-01-14 DIAGNOSIS — M79642 Pain in left hand: Secondary | ICD-10-CM

## 2024-01-14 DIAGNOSIS — R278 Other lack of coordination: Secondary | ICD-10-CM

## 2024-01-14 NOTE — Therapy (Signed)
 OUTPATIENT OCCUPATIONAL THERAPY ORTHO TREATMENT  Patient Name: Stacy [name] [facility] MRN: [id number] DOB:1960/09/22, 63 y.o., female Today's Date: 01/14/2024  PCP: Vicci Barnie NOVAK, MD REFERRING PROVIDER: Arlinda Buster, MD  END OF SESSION:  OT End of Session - 01/14/24 0934     Visit Number 5    Number of Visits 9    Date for Recertification  02/18/24    Authorization Type [employer]-no auth required; 60 visit limit PT/OT combined, received 14 sessions including eval last POC 04/02/23-06/15/23    OT Start Time 0932    OT Stop Time 1015    OT Time Calculation (min) 43 min    Equipment Utilized During Treatment ultrasound    Activity Tolerance Patient tolerated treatment well    Behavior During Therapy WFL for tasks assessed/performed          Past Medical History:  Diagnosis Date   Allergy     Shellfish, cleaning products   Anxiety    Arthritis    Arthrofibrosis of total knee replacement    right   Asthma    COPD (chronic obstructive pulmonary disease) (HCC)    Depression    Diabetes mellitus    Type II   GERD (gastroesophageal reflux disease)    Pt on Protonix  daily   Glaucoma    Gout    Headache(784.0)    otc meds prn   Hyperlipidemia    Hypertension    Irritable bowel syndrome 11/19/2010   Nausea and vomiting 11/30/2023   Neuropathy    Pneumonia YRS AGO   Restless legs    Shortness of breath    07/14/2018- uses  4 times a day   Sleep apnea    Past Surgical History:  Procedure Laterality Date   BACK SURGERY     feb 21, 23   CARPAL TUNNEL RELEASE Right 03/26/2023   Procedure: RIGHT CARPAL TUNNEL RELEASE;  Surgeon: Arlinda Buster, MD;  Location: [facility];  Service: Orthopedics;  Laterality: Right;   CARPOMETACARPEL SUSPENSION PLASTY Right 03/26/2023   Procedure: RIGHT THUMB CARPOMETACARPEL (CMC)  ARTHROPLASTY WITH INTERNAL BRACE;  Surgeon: Arlinda Buster, MD;  Location: [facility];  Service: Orthopedics;   Laterality: Right;   CHOLECYSTECTOMY     COLONOSCOPY     ENDOMETRIAL ABLATION  10/2010   HERNIA REPAIR     umbicial hernia   JOINT REPLACEMENT Left 03/14/2019   Dr. Vernetta hip   KNEE ARTHROSCOPY Left    06/07/2017 Dr. Addie of [facility]   KNEE ARTHROSCOPY Left 03/21/2020   Procedure: LEFT KNEE ARTHROSCOPY WITH PARTIAL MEDIAL MENISCECTOMY;  Surgeon: Vernetta Lonni GRADE, MD;  Location: [facility];  Service: Orthopedics;  Laterality: Left;   KNEE CLOSED REDUCTION Right 12/06/2015   Procedure: CLOSED MANIPULATION RIGHT KNEE;  Surgeon: Lynwood FORBES Better, MD;  Location: [facility];  Service: Orthopedics;  Laterality: Right;   KNEE CLOSED REDUCTION Right 01/17/2016   Procedure: CLOSED MANIPULATION RIGHT KNEE;  Surgeon: Lynwood FORBES Better, MD;  Location: [facility];  Service: Orthopedics;  Laterality: Right;   KNEE JOINT MANIPULATION Right 12/06/2015   LACRIMAL TUBE INSERTION Bilateral 03/01/2019   Procedure: LACRIMAL TUBE INSERTION;  Surgeon: Jacques Sharper, MD;  Location[facility];  Service: Ophthalmology;  Laterality: Bilateral;   LACRIMAL TUBE REMOVAL Bilateral 05/03/2019   Procedure: BILATERAL NASOLACRIMAL DUCT PROBING, IIRIGATION AND TUBE REMOVAL BOTH EYES;  Surgeon: Jacques Sharper, MD;  Location:  SURGERY CENTER;  Service: Ophthalmology;  Laterality: Bilateral;   LUMBAR  LAMINECTOMY/DECOMPRESSION MICRODISCECTOMY N/A 06/03/2015   Procedure: Bilateral lateral recess decompression L2-3, L3-4, L4-5;  Surgeon: Lynwood FORBES Better, MD;  Location: [facility];  Service: Orthopedics;  Laterality: N/A;   LUMBAR LAMINECTOMY/DECOMPRESSION MICRODISCECTOMY N/A 10/02/2016   Procedure: Right L5-S1 Lateral Recess Decompression  microdiscectomy;  Surgeon: Better Lynwood FORBES, MD;  Location: [facility] OR;  Service: Orthopedics;  Laterality: N/A;   LUMBAR LAMINECTOMY/DECOMPRESSION MICRODISCECTOMY N/A 07/15/2018   Procedure: LEFT L3-4 MICRODISCECTOMY;  Surgeon: Better Lynwood FORBES, MD;  Location[facility];   Service: Orthopedics;  Laterality: N/A;   svd      x 2   TEAR DUCT PROBING Bilateral 03/01/2019   Procedure: TEAR DUCT PROBING WITH IRRIGATION;  Surgeon: Jacques Sharper, MD;  Location: [facility];  Service: Ophthalmology;  Laterality: Bilateral;   TOTAL HIP ARTHROPLASTY Left 03/14/2019   Procedure: LEFT TOTAL HIP ARTHROPLASTY ANTERIOR APPROACH;  Surgeon: Vernetta Lonni GRADE, MD;  Location: [facility];  Service: Orthopedics;  Laterality: Left;   TOTAL KNEE ARTHROPLASTY Right 09/06/2015   Procedure: RIGHT TOTAL KNEE ARTHROPLASTY;  Surgeon: Lynwood FORBES Better, MD;  Location: [facility];  Service: Orthopedics;  Laterality: Right;   TOTAL KNEE REVISION Right 05/12/2022   Procedure: RIGHT TOTAL KNEE REVISION ARTHROPLASTY;  Surgeon: Vernetta Lonni GRADE, MD;  Location: [facility];  Service: Orthopedics;  Laterality: Right;   TUBAL LIGATION     UPPER GASTROINTESTINAL ENDOSCOPY  04/28/2011   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 tophus 11/30/2023   Constipation 11/30/2023   Diarrhea 11/30/2023   Diverticular disease of colon 11/30/2023   Enteric campylobacteriosis 11/30/2023   Gastroesophageal reflux disease with esophagitis 11/30/2023   Gastroparesis 11/30/2023   Generalized abdominal pain 11/30/2023   Nausea and vomiting 11/30/2023   Arthritis of carpometacarpal [facility]) joint of right thumb 03/26/2023   Carpal tunnel syndrome, right upper limb 03/26/2023   Status post revision of total replacement of right knee 05/12/2022   History of colonic polyps 03/20/2022   Failed total knee, right, subsequent encounter 03/02/2022   Loose right total knee arthroplasty 03/02/2022   Mixed stress and urge urinary incontinence 12/04/2021   Functional fecal incontinence 12/04/2021   IBS (irritable bowel syndrome) 11/19/2021   Spondylolisthesis, lumbar region    Other spondylosis with radiculopathy, lumbar region    Other secondary scoliosis, lumbar region    Fusion  of spine of lumbar region 03/25/2021   Colon polyps 06/06/2020   Lupus 06/06/2020   Allergic rhinitis due to animal (cat) (dog) hair and dander 04/08/2020   Allergic rhinitis due to pollen 04/08/2020   Food allergy  04/08/2020   Acute medial meniscus tear, left, subsequent encounter 03/21/2020   Chronic pain of left knee 02/15/2020   Paresthesia of skin 11/16/2019   History of total knee replacement, right 11/16/2019   Tobacco abuse 11/16/2019   Centrilobular emphysema (HCC) 08/10/2019   Incidental lung nodule, > 3mm and < 8mm 08/10/2019   OSA on CPAP 08/10/2019   Dyspnea on exertion 08/02/2019   Hyperlipidemia 08/02/2019   Lumbar radiculopathy 04/24/2019   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   Post laminectomy syndrome 02/23/2019   Abnormality of gait 02/23/2019   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   Unilateral primary osteoarthritis, left hip 12/28/2018   Lesion of skin of left ear 12/26/2018   Primary osteoarthritis of left hip 12/02/2018   Iron  deficiency anemia 10/16/2018   Chronic pain syndrome 09/08/2018   Chronic pain of right knee 08/11/2018  Status post lumbar laminectomy 07/15/2018   Peripheral arterial disease 04/05/2018   Moderate persistent asthma without complication 06/29/2017   Environmental and seasonal allergies 06/29/2017   Controlled type 2 diabetes mellitus with diabetic polyneuropathy, without long-term current use of insulin  (HCC) 06/29/2017   Perennial allergic rhinitis 04/08/2017   Sensorineural hearing loss (SNHL), bilateral 04/08/2017   Chronic pansinusitis 03/25/2017   Eustachian tube dysfunction, bilateral 03/25/2017   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   Herniation of lumbar intervertebral disc with radiculopathy 10/02/2016    Class: Chronic   Alopecia areata 08/19/2016   Chondromalacia of both patellae 06/03/2015    Class: Chronic   Spinal stenosis, lumbar region, with neurogenic claudication 06/03/2015   Tobacco use disorder 04/25/2015    DJD (degenerative joint disease) of knee 01/04/2015   Hemorrhoid 11/14/2014   Gout of big toe 07/19/2014   Essential hypertension 08/14/2013   Gastroesophageal reflux disease without esophagitis 08/14/2013   COPD (chronic obstructive pulmonary disease) (HCC) 04/17/2011    ONSET DATE: 11/18/2023  REFERRING DIAG: G56.02 (ICD-10-CM) - Carpal tunnel syndrome, left upper limb  Add'l note per Dr. Erwin: Needs OT 2 weeks s/p left CTR on 12/02/23 NO SPLINT  THERAPY DIAG:  Carpal tunnel syndrome, left upper limb  Pain in left wrist  Pain in left hand  Other lack of coordination  Rationale for Evaluation and Treatment: Rehabilitation  SUBJECTIVE:   SUBJECTIVE STATEMENT: Pt reports her hand is doing better, reports she was able to complete grip strength testing at Dr. Hurley.   Pt accompanied by: self  PERTINENT HISTORY: DM2 with neuropathy, previous CTR 02/25 on RUE, HLD, OA, R TKA, emphysema, lumbar fusion, IBS, incontinence of urine, lung nodule, lumbar radiculopathy, spinal stenosis, COPD, gout, and  right thumb carpometacarpal arthroplasty with internal bracing  PRECAUTIONS: None  RED FLAGS: None   WEIGHT BEARING RESTRICTIONS: No  PAIN:  Are you having pain? No  FALLS: Has patient fallen in last 6 months? Yes. Number of falls 3, no injuries   LIVING ENVIRONMENT: Lives with: lives alone Lives in: House/apartment Apartment Stairs: Yes: Internal: 13 steps; on left going up and External: 2 steps; on left going up Has following equipment at home: Walker - 4 wheeled and shower chair with a back  PLOF: Independent  PATIENT GOALS: I want to get back to normal and get my hand healed up. I want to build the strength in this hand, and be able to do my daily tasks with this hand.   NEXT MD VISIT: 03/14/24 podiatry  OBJECTIVE:  Note: Objective measures were completed at Evaluation unless otherwise noted.  HAND DOMINANCE: Left  ADLs: WFL  FUNCTIONAL OUTCOME MEASURES: Quick  Dash: 68.2/100  01/14/24:   UPPER EXTREMITY ROM:   N/T at eval  UPPER EXTREMITY MMT:   N/T at eval  HAND FUNCTION: Grip strength: Right: 61 lbs; Left: NT lbs 01/14/24: 56.2 avg R hand (61.9, 52.6, 54.2)  60 avg L hand (59, 58.4, 62.6)  COORDINATION: (pt's nails were long and untrimmed at time of testing) 9 Hole Peg test: Right: 41.46 sec; Left: 33.50 sec Box and Blocks:  Right 49 blocks, Left 54 blocks  SENSATION: WFL  EDEMA: Pt reports it was swollen before, but is elevating it whenever possible.   COGNITION: Overall cognitive status: Within functional limits for tasks assessed   OBSERVATIONS: Pain in L hand and wrist, healing surgical area, affecting ability to complete ADL/IADL   TREATMENT:                                                                                                                            -  Self-care/home management completed for duration as noted below including:  -Re-assessed STG/LTGs.   -Educated in desensitization in scar area for improved quality of life. See Pt instructions for handouts, and re-educated in joint protection strategies for mgmt of wrist pain, especially if pillar pain develops.   - Ultrasound completed for duration as noted below including:  Ultrasound applied to palmar left hand, digits, and wrist for 8 minutes, frequency of 3 MHz, 20% duty cycle, and 1.1 W/cm with pt's arm placed on soft towel for promotion of ROM, edema reduction, and pain reduction in affected extremity.   PATIENT EDUCATION: Education details: SEE ABOVE Person educated: Patient Education method: Programmer, Multimedia, Demonstration, Verbal cues, and Handouts Education comprehension: verbalized understanding, returned demonstration, verbal cues required, and needs further education  HOME EXERCISE PROGRAM: 12/17/23: IHP HEP s/p CTR, sleep positioning 12/24/23: joint protection, energy conservation 01/06/2024: heat; scar massage  GOALS: Goals reviewed with  patient? Yes  SHORT TERM GOALS: Target date: 01/30/24  Pt will be independent with HEP for wrist strengthening and hand strengthening Baseline: New to OP OT Goal status: IN PROGRESS  2.  Pt will be independent with scar tissue massage for L hand/wrist Baseline: New to OP OT Goal status: MET  3.  Pt will verbalize at least 2 joint protection strategies to reduce re-inflammation of L wrist and hand Baseline: New to OP OT 01/14/24: Pt required re-education Goal status:IN PROGRESS  4.  Pt will report reduced pain in ADL completion to no more than 2/10  Baseline: 6/10 01/14/24: 5/10 Goal status: IN PROGRESS    LONG TERM GOALS: Target date: 02/18/24  Patient will demonstrate at least 25% improvement with quick Dash score (reporting 43.2% disability or less) indicating improved functional use of affected extremity.  Baseline: Quick Dash: 68.2/100 01/14/24: 45.5/100 Goal status: IN PROGRESS  2.  OT will assess grip strength of L hand and make goal as appropriate. Baseline: New to OP OT Goal status: DISCONTINUE, NOT NEEDED  3.  Pt will be educated in AE/compnsatory strategies to promote ease and reduced pain in ADL/IADL completion Baseline: New to OP OT Goal status: IN PROGRESS  ASSESSMENT:  CLINICAL IMPRESSION: Patient presents to occupational therapy s/p L CTR with history of R CTR and thumb carpometacarpal arthroplasty with internal bracing (03/26/2023). Pt demonstrating good understanding of scar tissue massage, receptive to education in desensitization. Making progress in goals this date. Will continue to progress as able.   PERFORMANCE DEFICITS: in functional skills including ADLs, IADLs, strength, pain, and UE functional use, cognitive skills including safety awareness, and psychosocial skills including coping strategies and environmental adaptation.   IMPAIRMENTS: are limiting patient from ADLs, IADLs, rest and sleep, and leisure.   COMORBIDITIES: may have co-morbidities   that affects occupational performance. Patient will benefit from skilled OT to address above impairments and improve overall function.  REHAB POTENTIAL: Good  PLAN:  OT FREQUENCY: 1x/week  OT DURATION: 8 weeks  PLANNED INTERVENTIONS: 97168 OT Re-evaluation, 97535 self care/ADL training, 02889 therapeutic exercise, 97530 therapeutic activity, 97140 manual therapy, 97035 ultrasound, 97018 paraffin, 02960 fluidotherapy, 97010 moist heat, 97010 cryotherapy, scar mobilization, passive range of motion, energy conservation, coping strategies training, patient/family education, and DME and/or AE instructions  RECOMMENDED OTHER SERVICES: none  CONSULTED AND AGREED WITH PLAN OF CARE: Patient  PLAN FOR NEXT SESSION: Monitor scar area Scar massage Modalities (heat, ultrasound, fluido)  AE recommendations  Wrist strengthening/hand strengthening prn  DOS 12/02/23-DOT 01/14/24: 6 weeks post surgery this date  For all possible CPT  codes, reference the Planned Interventions line above.     Check all conditions that are expected to impact treatment: {Conditions expected to impact treatment:Diabetes mellitus, Musculoskeletal disorders, and Active major medical illness (Lupus)  If treatment provided at initial evaluation, no treatment charged due to lack of authorization.       Rocky Dutch, OT 01/14/2024, 11:33 AM

## 2024-01-14 NOTE — Patient Instructions (Signed)
  Desensitization Techniques  One way to desensitize a painful incision or area is by rubbing it with different textures. This will make your limb more tolerant to touch and pressure. Before you begin, make sure your hands and the materials you're using are clean.  To rub your painful/sensitive area with different textures:  . Sit in a comfortable position with the painful/sensitive area uncovered. . Start with a material that is soft, such as a cotton ball, silk or soft towel. Rub your painful/sensitive area in all directions. Start with a light pressure and gradually increase the pressure. . Vary the textures you use as you are able to tolerate them. Start with soft materials like cotton balls or a makeup brush. Progress to materials that are rougher. Examples include a paper towel, cloth towel, wool, or velcro. As you progress, gradually increase the pressure and roughness of the texture you use. Marland Kitchen Be careful not to rub over your incision if you still have staples or sutures in place, or if there are any open areas. . Rub your limb for at least 30 seconds progressing up to a minute or two as often as you can tolerate, or as recommended by your healthcare provider.  Other methods for desensitization include:  Marland Kitchen Dipping your affected area into a bowl of dry rice, sand, kidney beans, cold water or warm water. . Allow cool or warm water to run over the area. . Using a TENS unit - make sure to ask your physician or physical therapist prior to using one. If you choose to start with the method of dipping your affected area into a medium such as rice, sand, or water make sure to move the affected area around in the bowl until you cannot tolerate it or you reach one to two minutes, whichever comes first. Use a combination of these methods for 10 to 15 minutes, 3-4 times per day.  The goal of desensitization is to inhibit or interrupt the body's interpretation of routine stimuli as painful. It does not  assure that these stimuli will become pleasant or enjoyable, but that they will no longer provoke an extreme pain response.

## 2024-01-15 LAB — BASIC METABOLIC PANEL WITH GFR
BUN/Creatinine Ratio: 9 — ABNORMAL LOW (ref 12–28)
BUN: 8 mg/dL (ref 8–27)
CO2: 27 mmol/L (ref 20–29)
Calcium: 10 mg/dL (ref 8.7–10.3)
Chloride: 99 mmol/L (ref 96–106)
Creatinine, Ser: 0.93 mg/dL (ref 0.57–1.00)
Glucose: 96 mg/dL (ref 70–99)
Potassium: 4.3 mmol/L (ref 3.5–5.2)
Sodium: 140 mmol/L (ref 134–144)
eGFR: 69 mL/min/1.73 (ref >59)

## 2024-01-15 LAB — CBC
Hematocrit: 43.4 % (ref 34.0–46.6)
Hemoglobin: 13.5 g/dL (ref 11.1–15.9)
MCH: 27.8 pg (ref 26.6–33.0)
MCHC: 31.1 g/dL — ABNORMAL LOW (ref 31.5–35.7)
MCV: 89 fL (ref 79–97)
Platelets: 296 x10E3/uL (ref 150–450)
RBC: 4.86 x10E6/uL (ref 3.77–5.28)
RDW: 13.4 % (ref 11.7–15.4)
WBC: 5.7 x10E3/uL (ref 3.4–10.8)

## 2024-01-15 LAB — MICROALBUMIN / CREATININE URINE RATIO
Creatinine, Urine: 65.7 mg/dL
Microalb/Creat Ratio: 6 mg/g{creat} (ref 0–29)
Microalbumin, Urine: 3.7 ug/mL

## 2024-01-15 LAB — IRON,TIBC AND FERRITIN PANEL
Ferritin: 58 ng/mL (ref 15–150)
Iron Saturation: 115 ug/dL (ref 27–55)
Iron Saturation: 58 ng/mL (ref 15–55)
Total Iron Binding Capacity: 340 ug/dL (ref 250–450)
UIBC: 225 ug/dL (ref 118–369)

## 2024-01-17 ENCOUNTER — Telehealth: Payer: Self-pay | Admitting: Internal Medicine

## 2024-01-17 NOTE — Telephone Encounter (Signed)
 Copied from CRM #[id number]. Topic: General - Other >> Jan 17, 2024  4:19 PM Victoria B wrote:  Reason for CRM: patient called in states [employer] sent in request for supplies last week of briefs for patient and hasn't heard anything back. Please cb wit update

## 2024-01-18 NOTE — Telephone Encounter (Signed)
 The forms from [employer] are in the patients chart to receive her supplies

## 2024-01-18 NOTE — Telephone Encounter (Signed)
 The patient called back checking on the status of her incontinence supplies. She said when she met with her provider last week she told her she would send the order in to [employer] but they have yet to receive it. Please assist her as soon as possible as she really needs them.

## 2024-01-18 NOTE — Telephone Encounter (Signed)
 Incontinence order and OV notes successfully faxed to [employer] on 12/27/2023. [employer] faxed back the CMN form on 01/14/24 to be completed by provider. Form placed on provider box for completion. Dr.Johnson please advise when it is completed so that I may fax back.

## 2024-01-18 NOTE — Telephone Encounter (Signed)
 Copied from CRM #[id number]. Topic: General - Other >> Jan 17, 2024  4:19 PM Victoria B wrote:  Reason for CRM: patient called in states [employer] sent in request for supplies last week of briefs for patient and hasn't heard anything back. Please cb wit update  >> Jan 18, 2024  3:52 PM Montie POUR wrote:   [employer] called Pam and they have not received form from Dr. Vicci. Pam is out of her supplies and needs them as soon as possible. Please call Pam at [phone] to discuss when form will be completed. Thanks

## 2024-01-19 NOTE — Telephone Encounter (Signed)
Form will be left on your desk .

## 2024-01-19 NOTE — Telephone Encounter (Signed)
 Copied from CRM #[id number]. Topic: General - Other >> Jan 17, 2024  4:19 PM Victoria B wrote:  Reason for CRM: patient called in states [employer] sent in request for supplies last week of briefs for patient and hasn't heard anything back. Please cb wit update  >> Jan 19, 2024 10:07 AM Montie POUR wrote:  Holley is calling back because no one has call her back and [employer] has not received form/order for her incontinent supplies. She is out of supplies. Please call her at [phone]. Thanks I tried to transfer to clinic but they were busy and no answer.   >> Jan 18, 2024  3:52 PM Montie POUR wrote:   [employer] called Pam and they have not received form from Dr. Vicci. Pam is out of her supplies and needs them as soon as possible. Please call Pam at [phone] to discuss when form will be completed. Thanks

## 2024-01-19 NOTE — Telephone Encounter (Signed)
 Called & spoke to the patient. Verified name & DOB. Informed that [employer] faxed back the CMN form on 01/14/24 to be completed by provider. Form placed on provider box for completion. The completed form will be received tomorrow 01/20/24 and it will be faxed back to [employer]. Patient expressed verbal understanding.

## 2024-01-20 ENCOUNTER — Telehealth: Payer: Self-pay | Admitting: Internal Medicine

## 2024-01-20 ENCOUNTER — Ambulatory Visit

## 2024-01-20 DIAGNOSIS — R208 Other disturbances of skin sensation: Secondary | ICD-10-CM

## 2024-01-20 DIAGNOSIS — M25532 Pain in left wrist: Secondary | ICD-10-CM

## 2024-01-20 DIAGNOSIS — M6281 Muscle weakness (generalized): Secondary | ICD-10-CM

## 2024-01-20 DIAGNOSIS — M79642 Pain in left hand: Secondary | ICD-10-CM

## 2024-01-20 DIAGNOSIS — G5602 Carpal tunnel syndrome, left upper limb: Secondary | ICD-10-CM | POA: Diagnosis not present

## 2024-01-20 NOTE — Telephone Encounter (Signed)
 Patient called regarding the [employer] form. Patient was informed that the form has been faxed to [employer].

## 2024-01-20 NOTE — Therapy (Signed)
 OUTPATIENT OCCUPATIONAL THERAPY ORTHO TREATMENT  Patient Name: Stacy [name] [facility] MRN: [id number] DOB:November 30, 1960, 63 y.o., female Today's Date: 01/20/2024  PCP: Vicci Barnie NOVAK, MD REFERRING PROVIDER: Arlinda Buster, MD  END OF SESSION:  OT End of Session - 01/20/24 0842     Visit Number 6    Number of Visits 9    Date for Recertification  02/18/24    Authorization Type [employer]-no auth required; 60 visit limit PT/OT combined, received 14 sessions including eval last POC 04/02/23-06/15/23    OT Start Time 0800    OT Stop Time 0843    OT Time Calculation (min) 43 min    Equipment Utilized During Treatment IASTM, cupping, flex bars, hammer    Activity Tolerance Patient tolerated treatment well    Behavior During Therapy WFL for tasks assessed/performed           Past Medical History:  Diagnosis Date   Allergy     Shellfish, cleaning products   Anxiety    Arthritis    Arthrofibrosis of total knee replacement    right   Asthma    COPD (chronic obstructive pulmonary disease) (HCC)    Depression    Diabetes mellitus    Type II   GERD (gastroesophageal reflux disease)    Pt on Protonix  daily   Glaucoma    Gout    Headache(784.0)    otc meds prn   Hyperlipidemia    Hypertension    Irritable bowel syndrome 11/19/2010   Nausea and vomiting 11/30/2023   Neuropathy    Pneumonia YRS AGO   Restless legs    Shortness of breath    07/14/2018- uses  4 times a day   Sleep apnea    Past Surgical History:  Procedure Laterality Date   BACK SURGERY     feb 21, 23   CARPAL TUNNEL RELEASE Right 03/26/2023   Procedure: RIGHT CARPAL TUNNEL RELEASE;  Surgeon: Arlinda Buster, MD;  Location: [facility];  Service: Orthopedics;  Laterality: Right;   CARPOMETACARPEL SUSPENSION PLASTY Right 03/26/2023   Procedure: RIGHT THUMB CARPOMETACARPEL (CMC)  ARTHROPLASTY WITH INTERNAL BRACE;  Surgeon: Arlinda Buster, MD;  Location: [facility];  Service:  Orthopedics;  Laterality: Right;   CHOLECYSTECTOMY     COLONOSCOPY     ENDOMETRIAL ABLATION  10/2010   HERNIA REPAIR     umbicial hernia   JOINT REPLACEMENT Left 03/14/2019   Dr. Vernetta hip   KNEE ARTHROSCOPY Left    06/07/2017 Dr. Addie of [facility]   KNEE ARTHROSCOPY Left 03/21/2020   Procedure: LEFT KNEE ARTHROSCOPY WITH PARTIAL MEDIAL MENISCECTOMY;  Surgeon: Vernetta Lonni GRADE, MD;  Location: [facility];  Service: Orthopedics;  Laterality: Left;   KNEE CLOSED REDUCTION Right 12/06/2015   Procedure: CLOSED MANIPULATION RIGHT KNEE;  Surgeon: Lynwood FORBES Better, MD;  Location: [facility];  Service: Orthopedics;  Laterality: Right;   KNEE CLOSED REDUCTION Right 01/17/2016   Procedure: CLOSED MANIPULATION RIGHT KNEE;  Surgeon: Lynwood FORBES Better, MD;  Location: [facility];  Service: Orthopedics;  Laterality: Right;   KNEE JOINT MANIPULATION Right 12/06/2015   LACRIMAL TUBE INSERTION Bilateral 03/01/2019   Procedure: LACRIMAL TUBE INSERTION;  Surgeon: Jacques Sharper, MD;  Location: [facility];  Service: Ophthalmology;  Laterality: Bilateral;   LACRIMAL TUBE REMOVAL Bilateral 05/03/2019   Procedure: BILATERAL NASOLACRIMAL DUCT PROBING, IIRIGATION AND TUBE REMOVAL BOTH EYES;  Surgeon: Jacques Sharper, MD;  Location: [facility];  Service: Ophthalmology;  Laterality: Bilateral;   LUMBAR LAMINECTOMY/DECOMPRESSION MICRODISCECTOMY N/A 06/03/2015   Procedure: Bilateral lateral recess decompression L2-3, L3-4, L4-5;  Surgeon: Lynwood FORBES Better, MD;  Location: [facility];  Service: Orthopedics;  Laterality: N/A;   LUMBAR LAMINECTOMY/DECOMPRESSION MICRODISCECTOMY N/A 10/02/2016   Procedure: Right L5-S1 Lateral Recess Decompression  microdiscectomy;  Surgeon: Better Lynwood FORBES, MD;  Location: [facility] OR;  Service: Orthopedics;  Laterality: N/A;   LUMBAR LAMINECTOMY/DECOMPRESSION MICRODISCECTOMY N/A 07/15/2018   Procedure: LEFT L3-4 MICRODISCECTOMY;  Surgeon: Better Lynwood FORBES, MD;   Location: [facility] OR;  Service: Orthopedics;  Laterality: N/A;   svd      x 2   TEAR DUCT PROBING Bilateral 03/01/2019   Procedure: TEAR DUCT PROBING WITH IRRIGATION;  Surgeon: Jacques Sharper, MD;  Location: [facility];  Service: Ophthalmology;  Laterality: Bilateral;   TOTAL HIP ARTHROPLASTY Left 03/14/2019   Procedure: LEFT TOTAL HIP ARTHROPLASTY ANTERIOR APPROACH;  Surgeon: Vernetta Lonni GRADE, MD;  Location: [facility];  Service: Orthopedics;  Laterality: Left;   TOTAL KNEE ARTHROPLASTY Right 09/06/2015   Procedure: RIGHT TOTAL KNEE ARTHROPLASTY;  Surgeon: Lynwood FORBES Better, MD;  Location: [facility];  Service: Orthopedics;  Laterality: Right;   TOTAL KNEE REVISION Right 05/12/2022   Procedure: RIGHT TOTAL KNEE REVISION ARTHROPLASTY;  Surgeon: Vernetta Lonni GRADE, MD;  Location: [facility];  Service: Orthopedics;  Laterality: Right;   TUBAL LIGATION     UPPER GASTROINTESTINAL ENDOSCOPY  04/28/2011   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 tophus 11/30/2023   Constipation 11/30/2023   Diarrhea 11/30/2023   Diverticular disease of colon 11/30/2023   Enteric campylobacteriosis 11/30/2023   Gastroesophageal reflux disease with esophagitis 11/30/2023   Gastroparesis 11/30/2023   Generalized abdominal pain 11/30/2023   Nausea and vomiting 11/30/2023   Arthritis of carpometacarpal [facility]) joint of right thumb 03/26/2023   Carpal tunnel syndrome, right upper limb 03/26/2023   Status post revision of total replacement of right knee 05/12/2022   History of colonic polyps 03/20/2022   Failed total knee, right, subsequent encounter 03/02/2022   Loose right total knee arthroplasty 03/02/2022   Mixed stress and urge urinary incontinence 12/04/2021   Functional fecal incontinence 12/04/2021   IBS (irritable bowel syndrome) 11/19/2021   Spondylolisthesis, lumbar region    Other spondylosis with radiculopathy, lumbar region    Other secondary scoliosis, lumbar  region    Fusion of spine of lumbar region 03/25/2021   Colon polyps 06/06/2020   Lupus 06/06/2020   Allergic rhinitis due to animal (cat) (dog) hair and dander 04/08/2020   Allergic rhinitis due to pollen 04/08/2020   Food allergy  04/08/2020   Acute medial meniscus tear, left, subsequent encounter 03/21/2020   Chronic pain of left knee 02/15/2020   Paresthesia of skin 11/16/2019   History of total knee replacement, right 11/16/2019   Tobacco abuse 11/16/2019   Centrilobular emphysema (HCC) 08/10/2019   Incidental lung nodule, > 3mm and < 8mm 08/10/2019   OSA on CPAP 08/10/2019   Dyspnea on exertion 08/02/2019   Hyperlipidemia 08/02/2019   Lumbar radiculopathy 04/24/2019   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   Post laminectomy syndrome 02/23/2019   Abnormality of gait 02/23/2019   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   Unilateral primary osteoarthritis, left hip 12/28/2018   Lesion of skin of left ear 12/26/2018   Primary osteoarthritis of left hip 12/02/2018   Iron  deficiency anemia 10/16/2018   Chronic pain syndrome 09/08/2018   Chronic pain of  right knee 08/11/2018   Status post lumbar laminectomy 07/15/2018   Peripheral arterial disease 04/05/2018   Moderate persistent asthma without complication 06/29/2017   Environmental and seasonal allergies 06/29/2017   Controlled type 2 diabetes mellitus with diabetic polyneuropathy, without long-term current use of insulin  (HCC) 06/29/2017   Perennial allergic rhinitis 04/08/2017   Sensorineural hearing loss (SNHL), bilateral 04/08/2017   Chronic pansinusitis 03/25/2017   Eustachian tube dysfunction, bilateral 03/25/2017   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   Herniation of lumbar intervertebral disc with radiculopathy 10/02/2016    Class: Chronic   Alopecia areata 08/19/2016   Chondromalacia of both patellae 06/03/2015    Class: Chronic   Spinal stenosis, lumbar region, with neurogenic claudication 06/03/2015   Tobacco use  disorder 04/25/2015   DJD (degenerative joint disease) of knee 01/04/2015   Hemorrhoid 11/14/2014   Gout of big toe 07/19/2014   Essential hypertension 08/14/2013   Gastroesophageal reflux disease without esophagitis 08/14/2013   COPD (chronic obstructive pulmonary disease) (HCC) 04/17/2011    ONSET DATE: 11/18/2023  REFERRING DIAG: G56.02 (ICD-10-CM) - Carpal tunnel syndrome, left upper limb  Add'l note per Dr. Erwin: Needs OT 2 weeks s/p left CTR on 12/02/23 NO SPLINT  THERAPY DIAG:  Pain in left wrist  Pain in left hand  Other disturbances of skin sensation  Muscle weakness (generalized)  Rationale for Evaluation and Treatment: Rehabilitation  SUBJECTIVE:   SUBJECTIVE STATEMENT: Pt reports she had her grandkids over this past weekend, reporting no pain.   Pt accompanied by: self  PERTINENT HISTORY: DM2 with neuropathy, previous CTR 02/25 on RUE, HLD, OA, R TKA, emphysema, lumbar fusion, IBS, incontinence of urine, lung nodule, lumbar radiculopathy, spinal stenosis, COPD, gout, and  right thumb carpometacarpal arthroplasty with internal bracing  PRECAUTIONS: None  RED FLAGS: None   WEIGHT BEARING RESTRICTIONS: No  PAIN:  Are you having pain? No  FALLS: Has patient fallen in last 6 months? Yes. Number of falls 3, no injuries   LIVING ENVIRONMENT: Lives with: lives alone Lives in: House/apartment Apartment Stairs: Yes: Internal: 13 steps; on left going up and External: 2 steps; on left going up Has following equipment at home: Walker - 4 wheeled and shower chair with a back  PLOF: Independent  PATIENT GOALS: I want to get back to normal and get my hand healed up. I want to build the strength in this hand, and be able to do my daily tasks with this hand.   NEXT MD VISIT: 03/14/24 podiatry  OBJECTIVE:  Note: Objective measures were completed at Evaluation unless otherwise noted.  HAND DOMINANCE: Left  ADLs: WFL  FUNCTIONAL OUTCOME MEASURES: Quick Dash:  68.2/100  01/14/24:   UPPER EXTREMITY ROM:   N/T at eval  UPPER EXTREMITY MMT:   N/T at eval  HAND FUNCTION: Grip strength: Right: 61 lbs; Left: NT lbs 01/14/24: 56.2 avg R hand (61.9, 52.6, 54.2)  60 avg L hand (59, 58.4, 62.6)  COORDINATION: (pt's nails were long and untrimmed at time of testing) 9 Hole Peg test: Right: 41.46 sec; Left: 33.50 sec Box and Blocks:  Right 49 blocks, Left 54 blocks  SENSATION: WFL  EDEMA: Pt reports it was swollen before, but is elevating it whenever possible.   COGNITION: Overall cognitive status: Within functional limits for tasks assessed   OBSERVATIONS: Pain in L hand and wrist, healing surgical area, affecting ability to complete ADL/IADL   TREATMENT:                                                                                                                            -  Manual therapy completed for duration as noted below including:  Therapist completed IASTM using edge mobility tool at site of incision using deep prep lotion as emollient for myofascial restrictions, reduction of scar tissue, improve soft tissue mobility, and to enhance functional range of motion of affected extremity. Improved appearance to incision noted upon completion with less palpable adhesions. Also utilized cupping techniques for improved scar tissue mobilization.  - Therapeutic exercises completed for duration as noted below including: Educated pt in isometric exercises for wrist strengthening. Provided visual demo and verbal instruction for carryover, as well as printed HEP with written and pictorial instruction.Pt returned demo, min cues required for proper form. See pt instructions for handout or HEP below.  Pt engaged in resistance bar exercises again with yellow and red flex bar x 20 reps each for strength and endurance of affected extremity and printed [employer] HEP provided in pt instructions for the following: -wrist flex and extension - twisting bar  forward and backwards to flex and extend wrist/s Pt able to perform all motions without difficulty.   Finally completed wrist pronation/supination with hammer, 10 reps with no difficulty.     PATIENT EDUCATION: Education details: SEE ABOVE Person educated: Patient Education method: Explanation, Demonstration, Verbal cues, and Handouts Education comprehension: verbalized understanding, returned demonstration, verbal cues required, and needs further education  HOME EXERCISE PROGRAM: 12/17/23: IHP HEP s/p CTR, sleep positioning 12/24/23: joint protection, energy conservation 01/06/2024: heat; scar massage  01/20/2024: Access Code: [email or web address] URL: [email or web address]  Exercises - Isometric Wrist Extension Pronated  - 1 x daily - 5 x weekly - 3 sets - 5-10 reps - 3-5 hold - Seated Isometric Wrist Flexion Supinated with Manual Resistance  - 1 x daily - 5 x weekly - 3 sets - 5-10 reps - 3-5 hold - Seated Isometric Wrist Ulnar Deviation with Manual Resistance  - 1 x daily - 5 x weekly - 3 sets - 5-10 reps - 3-5 hold - Seated Isometric Wrist Radial Deviation with Manual Resistance  - 1 x daily - 5 x weekly - 3 sets - 5-10 reps - 3-5 hold - Seated Isometric Forearm Supination  - 1 x daily - 5 x weekly - 3 sets - 5-10 reps - 3-5 hold - Isometric Wrist Pronation  - 1 x daily - 7 x weekly - 3 sets - 5-10 reps - 3-5 hold  GOALS: Goals reviewed with patient? Yes  SHORT TERM GOALS: Target date: 01/30/24  Pt will be independent with HEP for wrist strengthening and hand strengthening Baseline: New to OP OT Goal status: IN PROGRESS  2.  Pt will be independent with scar tissue massage for L hand/wrist Baseline: New to OP OT Goal status: MET  3.  Pt will verbalize at least 2 joint protection strategies to reduce re-inflammation of L wrist and hand Baseline: New to OP OT 01/14/24: Pt required re-education Goal status:IN PROGRESS  4.  Pt will report reduced pain in ADL completion  to no more than 2/10  Baseline: 6/10 01/14/24: 5/10 Goal status: IN PROGRESS    LONG TERM GOALS: Target date: 02/18/24  Patient will demonstrate at least 25% improvement with quick Dash score (reporting 43.2% disability or less) indicating improved functional use of affected extremity.  Baseline: Quick Dash: 68.2/100 01/14/24: 45.5/100 Goal status: IN PROGRESS  2.  OT will assess grip strength of L hand and make goal as appropriate. Baseline: New to OP OT Goal status: DISCONTINUE, NOT NEEDED  3.  Pt will be educated in  AE/compnsatory strategies to promote ease and reduced pain in ADL/IADL completion Baseline: New to OP OT Goal status: IN PROGRESS  ASSESSMENT:  CLINICAL IMPRESSION: Patient presents to occupational therapy s/p L CTR with history of R CTR and thumb carpometacarpal arthroplasty with internal bracing (03/26/2023). Pt participating well with exercises and receptive to education in manual tx techniques. Will continue to progress as able, would benefit from continued skilled services to continue improvement.  PERFORMANCE DEFICITS: in functional skills including ADLs, IADLs, strength, pain, and UE functional use, cognitive skills including safety awareness, and psychosocial skills including coping strategies and environmental adaptation.   IMPAIRMENTS: are limiting patient from ADLs, IADLs, rest and sleep, and leisure.   COMORBIDITIES: may have co-morbidities  that affects occupational performance. Patient will benefit from skilled OT to address above impairments and improve overall function.  REHAB POTENTIAL: Good  PLAN:  OT FREQUENCY: 1x/week  OT DURATION: 8 weeks  PLANNED INTERVENTIONS: 97168 OT Re-evaluation, 97535 self care/ADL training, 02889 therapeutic exercise, 97530 therapeutic activity, 97140 manual therapy, 97035 ultrasound, 97018 paraffin, 02960 fluidotherapy, 97010 moist heat, 97010 cryotherapy, scar mobilization, passive range of motion, energy  conservation, coping strategies training, patient/family education, and DME and/or AE instructions  RECOMMENDED OTHER SERVICES: none  CONSULTED AND AGREED WITH PLAN OF CARE: Patient  PLAN FOR NEXT SESSION: Monitor scar area Scar massage Modalities (heat, ultrasound, fluido)  AE recommendations  Review joint protection Wrist strengthening/hand strengthening activities  DOS 12/02/23-DOT 01/20/24: 7 weeks post surgery this date  For all possible CPT codes, reference the Planned Interventions line above.     Check all conditions that are expected to impact treatment: {Conditions expected to impact treatment:Diabetes mellitus, Musculoskeletal disorders, and Active major medical illness (Lupus)  If treatment provided at initial evaluation, no treatment charged due to lack of authorization.       Rocky Dutch, OT 01/20/2024, 8:43 AM

## 2024-01-20 NOTE — Telephone Encounter (Signed)
 [employer] form successfully faxed back on 01/20/2024. Fax #: [phone].

## 2024-01-21 NOTE — Telephone Encounter (Signed)
 Noted! Thank you

## 2024-01-24 ENCOUNTER — Telehealth: Admitting: *Deleted

## 2024-01-26 ENCOUNTER — Ambulatory Visit: Admitting: Occupational Therapy

## 2024-01-28 ENCOUNTER — Ambulatory Visit: Admitting: Occupational Therapy

## 2024-01-28 DIAGNOSIS — G5602 Carpal tunnel syndrome, left upper limb: Secondary | ICD-10-CM | POA: Diagnosis not present

## 2024-01-28 DIAGNOSIS — M79642 Pain in left hand: Secondary | ICD-10-CM

## 2024-01-28 DIAGNOSIS — R29898 Other symptoms and signs involving the musculoskeletal system: Secondary | ICD-10-CM

## 2024-01-28 DIAGNOSIS — R29818 Other symptoms and signs involving the nervous system: Secondary | ICD-10-CM

## 2024-01-28 DIAGNOSIS — M25532 Pain in left wrist: Secondary | ICD-10-CM

## 2024-01-28 DIAGNOSIS — M6281 Muscle weakness (generalized): Secondary | ICD-10-CM

## 2024-01-28 DIAGNOSIS — R278 Other lack of coordination: Secondary | ICD-10-CM

## 2024-01-28 DIAGNOSIS — M25541 Pain in joints of right hand: Secondary | ICD-10-CM

## 2024-01-28 DIAGNOSIS — R208 Other disturbances of skin sensation: Secondary | ICD-10-CM

## 2024-01-28 NOTE — Therapy (Signed)
 " OUTPATIENT OCCUPATIONAL THERAPY ORTHO TREATMENT  Patient Name: Stacy [name] [facility] MRN: [id number] DOB:1960-06-10, 63 y.o., female Today's Date: 01/28/2024  PCP: Vicci Barnie NOVAK, MD REFERRING PROVIDER: Arlinda Buster, MD  END OF SESSION:  OT End of Session - 01/28/24 0755     Visit Number 7    Number of Visits 9    Date for Recertification  02/18/24    Authorization Type [employer]-no auth required; 60 visit limit PT/OT combined, received 14 sessions including eval last POC 04/02/23-06/15/23    OT Start Time 0803    OT Stop Time 0844    OT Time Calculation (min) 41 min    Activity Tolerance Patient tolerated treatment well    Behavior During Therapy WFL for tasks assessed/performed           Past Medical History:  Diagnosis Date   Allergy     Shellfish, cleaning products   Anxiety    Arthritis    Arthrofibrosis of total knee replacement    right   Asthma    COPD (chronic obstructive pulmonary disease) (HCC)    Depression    Diabetes mellitus    Type II   GERD (gastroesophageal reflux disease)    Pt on Protonix  daily   Glaucoma    Gout    Headache(784.0)    otc meds prn   Hyperlipidemia    Hypertension    Irritable bowel syndrome 11/19/2010   Nausea and vomiting 11/30/2023   Neuropathy    Pneumonia YRS AGO   Restless legs    Shortness of breath    07/14/2018- uses  4 times a day   Sleep apnea    Past Surgical History:  Procedure Laterality Date   BACK SURGERY     feb 21, 23   CARPAL TUNNEL RELEASE Right 03/26/2023   Procedure: RIGHT CARPAL TUNNEL RELEASE;  Surgeon: Arlinda Buster, MD;  Location: [facility];  Service: Orthopedics;  Laterality: Right;   CARPOMETACARPEL SUSPENSION PLASTY Right 03/26/2023   Procedure: RIGHT THUMB CARPOMETACARPEL (CMC)  ARTHROPLASTY WITH INTERNAL BRACE;  Surgeon: Arlinda Buster, MD;  Location: [facility];  Service: Orthopedics;  Laterality: Right;   CHOLECYSTECTOMY     COLONOSCOPY      ENDOMETRIAL ABLATION  10/2010   HERNIA REPAIR     umbicial hernia   JOINT REPLACEMENT Left 03/14/2019   Dr. Vernetta hip   KNEE ARTHROSCOPY Left    06/07/2017 Dr. Addie of [facility]   KNEE ARTHROSCOPY Left 03/21/2020   Procedure: LEFT KNEE ARTHROSCOPY WITH PARTIAL MEDIAL MENISCECTOMY;  Surgeon: Vernetta Lonni GRADE, MD;  Location: [facility];  Service: Orthopedics;  Laterality: Left;   KNEE CLOSED REDUCTION Right 12/06/2015   Procedure: CLOSED MANIPULATION RIGHT KNEE;  Surgeon: Lynwood FORBES Better, MD;  Location: [facility];  Service: Orthopedics;  Laterality: Right;   KNEE CLOSED REDUCTION Right 01/17/2016   Procedure: CLOSED MANIPULATION RIGHT KNEE;  Surgeon: Lynwood FORBES Better, MD;  Location: [facility];  Service: Orthopedics;  Laterality: Right;   KNEE JOINT MANIPULATION Right 12/06/2015   LACRIMAL TUBE INSERTION Bilateral 03/01/2019   Procedure: LACRIMAL TUBE INSERTION;  Surgeon: Jacques Sharper, MD;  Location: [facility];  Service: Ophthalmology;  Laterality: Bilateral;   LACRIMAL TUBE REMOVAL Bilateral 05/03/2019   Procedure: BILATERAL NASOLACRIMAL DUCT PROBING, IIRIGATION AND TUBE REMOVAL BOTH EYES;  Surgeon: Jacques Sharper, MD;  Location: [facility];  Service: Ophthalmology;  Laterality: Bilateral;   LUMBAR LAMINECTOMY/DECOMPRESSION MICRODISCECTOMY N/A 06/03/2015  Procedure: Bilateral lateral recess decompression L2-3, L3-4, L4-5;  Surgeon: Lynwood FORBES Better, MD;  Location: [facility];  Service: Orthopedics;  Laterality: N/A;   LUMBAR LAMINECTOMY/DECOMPRESSION MICRODISCECTOMY N/A 10/02/2016   Procedure: Right L5-S1 Lateral Recess Decompression  microdiscectomy;  Surgeon: Better Lynwood FORBES, MD;  Location: [facility] OR;  Service: Orthopedics;  Laterality: N/A;   LUMBAR LAMINECTOMY/DECOMPRESSION MICRODISCECTOMY N/A 07/15/2018   Procedure: LEFT L3-4 MICRODISCECTOMY;  Surgeon: Better Lynwood FORBES, MD;  Location[facility];  Service: Orthopedics;  Laterality: N/A;   svd      x 2    TEAR DUCT PROBING Bilateral 03/01/2019   Procedure: TEAR DUCT PROBING WITH IRRIGATION;  Surgeon: Jacques Sharper, MD;  Location: [facility];  Service: Ophthalmology;  Laterality: Bilateral;   TOTAL HIP ARTHROPLASTY Left 03/14/2019   Procedure: LEFT TOTAL HIP ARTHROPLASTY ANTERIOR APPROACH;  Surgeon: Vernetta Lonni GRADE, MD;  Location: [facility];  Service: Orthopedics;  Laterality: Left;   TOTAL KNEE ARTHROPLASTY Right 09/06/2015   Procedure: RIGHT TOTAL KNEE ARTHROPLASTY;  Surgeon: Lynwood FORBES Better, MD;  Location: [facility];  Service: Orthopedics;  Laterality: Right;   TOTAL KNEE REVISION Right 05/12/2022   Procedure: RIGHT TOTAL KNEE REVISION ARTHROPLASTY;  Surgeon: Vernetta Lonni GRADE, MD;  Location: [facility];  Service: Orthopedics;  Laterality: Right;   TUBAL LIGATION     UPPER GASTROINTESTINAL ENDOSCOPY  04/28/2011   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 tophus 11/30/2023   Constipation 11/30/2023   Diarrhea 11/30/2023   Diverticular disease of colon 11/30/2023   Enteric campylobacteriosis 11/30/2023   Gastroesophageal reflux disease with esophagitis 11/30/2023   Gastroparesis 11/30/2023   Generalized abdominal pain 11/30/2023   Nausea and vomiting 11/30/2023   Arthritis of carpometacarpal [facility] joint of right thumb 03/26/2023   Carpal tunnel syndrome, right upper limb 03/26/2023   Status post revision of total replacement of right knee 05/12/2022   History of colonic polyps 03/20/2022   Failed total knee, right, subsequent encounter 03/02/2022   Loose right total knee arthroplasty 03/02/2022   Mixed stress and urge urinary incontinence 12/04/2021   Functional fecal incontinence 12/04/2021   IBS (irritable bowel syndrome) 11/19/2021   Spondylolisthesis, lumbar region    Other spondylosis with radiculopathy, lumbar region    Other secondary scoliosis, lumbar region    Fusion of spine of lumbar region 03/25/2021   Colon polyps  06/06/2020   Lupus 06/06/2020   Allergic rhinitis due to animal (cat) (dog) hair and dander 04/08/2020   Allergic rhinitis due to pollen 04/08/2020   Food allergy  04/08/2020   Acute medial meniscus tear, left, subsequent encounter 03/21/2020   Chronic pain of left knee 02/15/2020   Paresthesia of skin 11/16/2019   History of total knee replacement, right 11/16/2019   Tobacco abuse 11/16/2019   Centrilobular emphysema (HCC) 08/10/2019   Incidental lung nodule, > 3mm and < 8mm 08/10/2019   OSA on CPAP 08/10/2019   Dyspnea on exertion 08/02/2019   Hyperlipidemia 08/02/2019   Lumbar radiculopathy 04/24/2019   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   Post laminectomy syndrome 02/23/2019   Abnormality of gait 02/23/2019   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   Unilateral primary osteoarthritis, left hip 12/28/2018   Lesion of skin of left ear 12/26/2018   Primary osteoarthritis of left hip 12/02/2018   Iron  deficiency anemia 10/16/2018   Chronic pain syndrome 09/08/2018   Chronic pain of right knee 08/11/2018   Status post lumbar laminectomy 07/15/2018  Peripheral arterial disease 04/05/2018   Moderate persistent asthma without complication 06/29/2017   Environmental and seasonal allergies 06/29/2017   Controlled type 2 diabetes mellitus with diabetic polyneuropathy, without long-term current use of insulin  (HCC) 06/29/2017   Perennial allergic rhinitis 04/08/2017   Sensorineural hearing loss (SNHL), bilateral 04/08/2017   Chronic pansinusitis 03/25/2017   Eustachian tube dysfunction, bilateral 03/25/2017   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   Herniation of lumbar intervertebral disc with radiculopathy 10/02/2016    Class: Chronic   Alopecia areata 08/19/2016   Chondromalacia of both patellae 06/03/2015    Class: Chronic   Spinal stenosis, lumbar region, with neurogenic claudication 06/03/2015   Tobacco use disorder 04/25/2015   DJD (degenerative joint disease) of knee 01/04/2015    Hemorrhoid 11/14/2014   Gout of big toe 07/19/2014   Essential hypertension 08/14/2013   Gastroesophageal reflux disease without esophagitis 08/14/2013   COPD (chronic obstructive pulmonary disease) (HCC) 04/17/2011    ONSET DATE: 11/18/2023  REFERRING DIAG: G56.02 (ICD-10-CM) - Carpal tunnel syndrome, left upper limb  Add'l note per Dr. Erwin: Needs OT 2 weeks s/p left CTR on 12/02/23 NO SPLINT  THERAPY DIAG:  Pain in left wrist  Pain in left hand  Other disturbances of skin sensation  Muscle weakness (generalized)  Carpal tunnel syndrome, left upper limb  Other lack of coordination  Other symptoms and signs involving the musculoskeletal system  Pain in thumb joint with movement of right hand  Pain in joint of right hand  Other symptoms and signs involving the nervous system  Rationale for Evaluation and Treatment: Rehabilitation  SUBJECTIVE:   SUBJECTIVE STATEMENT: She has been completing HEP as instructed.   Pt accompanied by: self  PERTINENT HISTORY: DM2 with neuropathy, previous CTR 02/25 on RUE, HLD, OA, R TKA, emphysema, lumbar fusion, IBS, incontinence of urine, lung nodule, lumbar radiculopathy, spinal stenosis, COPD, gout, and  right thumb carpometacarpal arthroplasty with internal bracing  PRECAUTIONS: None  RED FLAGS: None   WEIGHT BEARING RESTRICTIONS: No  PAIN:  Are you having pain? No  FALLS: Has patient fallen in last 6 months? Yes. Number of falls 3, no injuries   LIVING ENVIRONMENT: Lives with: lives alone Lives in: House/apartment Apartment Stairs: Yes: Internal: 13 steps; on left going up and External: 2 steps; on left going up Has following equipment at home: Walker - 4 wheeled and shower chair with a back  PLOF: Independent  PATIENT GOALS: I want to get back to normal and get my hand healed up. I want to build the strength in this hand, and be able to do my daily tasks with this hand.   NEXT MD VISIT: 03/14/24  podiatry  OBJECTIVE:  Note: Objective measures were completed at Evaluation unless otherwise noted.  HAND DOMINANCE: Left  ADLs: WFL  FUNCTIONAL OUTCOME MEASURES: Quick Dash: 68.2/100  01/14/24:   UPPER EXTREMITY ROM:   N/T at eval  UPPER EXTREMITY MMT:   N/T at eval  HAND FUNCTION: Grip strength: Right: 61 lbs; Left: NT lbs 01/14/24: 56.2 avg R hand (61.9, 52.6, 54.2)  60 avg L hand (59, 58.4, 62.6)  COORDINATION: (pt's nails were long and untrimmed at time of testing) 9 Hole Peg test: Right: 41.46 sec; Left: 33.50 sec Box and Blocks:  Right 49 blocks, Left 54 blocks  SENSATION: WFL  EDEMA: Pt reports it was swollen before, but is elevating it whenever possible.   COGNITION: Overall cognitive status: Within functional limits for tasks assessed   OBSERVATIONS: Pain in L hand  and wrist, healing surgical area, affecting ability to complete ADL/IADL   TREATMENT:                                                                                                                            - Manual therapy completed for duration as noted below including:  Therapist completed IASTM using tissue movers tool at site of incision using deep prep lotion as emollient for myofascial restrictions, reduction of scar tissue, improve soft tissue mobility, and to enhance functional range of motion of affected extremity. Improved appearance to incision noted upon completion with less palpable adhesions. Also utilized cupping techniques for improved scar tissue mobilization.  - Therapeutic activities completed for duration as noted below including: Holding Mini True Balance in left hand, patient attempted to stack all 8 discs on top of one another for improved coordination and proprioceptive input through affected extremity.  Patient was able to complete stacking of discs requiring increased time x3 reps.  Using looper hoop, patient completed left wrist circles x2 minutes with flexed wrist and  then with extended wrist for 2 additional minutes. Pt used [place] board with holiday beads and use of LUE to pick up, store, and place individual marbles for improved strength, in hand manipulation, and fine motor coordination.       PATIENT EDUCATION: Education details: SEE ABOVE Person educated: Patient Education method: Explanation, Demonstration, and Verbal cues Education comprehension: verbalized understanding, returned demonstration, verbal cues required, and needs further education  HOME EXERCISE PROGRAM: 12/17/23: IHP HEP s/p CTR, sleep positioning 12/24/23: joint protection, energy conservation 01/06/2024: heat; scar massage  01/20/2024: Access Code: [email or web address] URL: [email or web address]  Exercises - Isometric Wrist Extension Pronated  - 1 x daily - 5 x weekly - 3 sets - 5-10 reps - 3-5 hold - Seated Isometric Wrist Flexion Supinated with Manual Resistance  - 1 x daily - 5 x weekly - 3 sets - 5-10 reps - 3-5 hold - Seated Isometric Wrist Ulnar Deviation with Manual Resistance  - 1 x daily - 5 x weekly - 3 sets - 5-10 reps - 3-5 hold - Seated Isometric Wrist Radial Deviation with Manual Resistance  - 1 x daily - 5 x weekly - 3 sets - 5-10 reps - 3-5 hold - Seated Isometric Forearm Supination  - 1 x daily - 5 x weekly - 3 sets - 5-10 reps - 3-5 hold - Isometric Wrist Pronation  - 1 x daily - 7 x weekly - 3 sets - 5-10 reps - 3-5 hold  GOALS: Goals reviewed with patient? Yes  SHORT TERM GOALS: Target date: 01/30/24  Pt will be independent with HEP for wrist strengthening and hand strengthening Baseline: New to OP OT Goal status: IN PROGRESS  2.  Pt will be independent with scar tissue massage for L hand/wrist Baseline: New to OP OT Goal status: MET  3.  Pt will verbalize at least 2 joint protection strategies to reduce re-inflammation of L wrist  and hand Baseline: New to OP OT 01/14/24: Pt required re-education Goal status:IN PROGRESS  4.  Pt will report  reduced pain in ADL completion to no more than 2/10  Baseline: 6/10 01/14/24: 5/10 Goal status: MET    LONG TERM GOALS: Target date: 02/18/24  Patient will demonstrate at least 25% improvement with quick Dash score (reporting 43.2% disability or less) indicating improved functional use of affected extremity.  Baseline: Quick Dash: 68.2/100 01/14/24: 45.5/100 Goal status: IN PROGRESS  2.  OT will assess grip strength of L hand and make goal as appropriate. Baseline: New to OP OT Goal status: DISCONTINUE, NOT NEEDED  3.  Pt will be educated in AE/compnsatory strategies to promote ease and reduced pain in ADL/IADL completion Baseline: New to OP OT Goal status: IN PROGRESS  ASSESSMENT:  CLINICAL IMPRESSION: Patient presents to occupational therapy s/p L CTR with history of R CTR and thumb carpometacarpal arthroplasty with internal bracing (03/26/2023). Improved appearance to incision this date with mild adhesions. Good tolerance to IASTM using tissue movers. Progress towards remaining goals.   PERFORMANCE DEFICITS: in functional skills including ADLs, IADLs, strength, pain, and UE functional use, cognitive skills including safety awareness, and psychosocial skills including coping strategies and environmental adaptation.   IMPAIRMENTS: are limiting patient from ADLs, IADLs, rest and sleep, and leisure.   COMORBIDITIES: may have co-morbidities  that affects occupational performance. Patient will benefit from skilled OT to address above impairments and improve overall function.  REHAB POTENTIAL: Good  PLAN:  OT FREQUENCY: 1x/week  OT DURATION: 8 weeks  PLANNED INTERVENTIONS: 97168 OT Re-evaluation, 97535 self care/ADL training, 02889 therapeutic exercise, 97530 therapeutic activity, 97140 manual therapy, 97035 ultrasound, 97018 paraffin, 02960 fluidotherapy, 97010 moist heat, 97010 cryotherapy, scar mobilization, passive range of motion, energy conservation, coping strategies  training, patient/family education, and DME and/or AE instructions  RECOMMENDED OTHER SERVICES: none  CONSULTED AND AGREED WITH PLAN OF CARE: Patient  PLAN FOR NEXT SESSION: Begin d/c planning  Scar massage Modalities (heat, ultrasound, fluido)  AE recommendations  Review joint protection - update goal Wrist strengthening/hand strengthening activities  DOS 12/02/23-DOT 01/28/24: 8 weeks post surgery this date  For all possible CPT codes, reference the Planned Interventions line above.     Check all conditions that are expected to impact treatment: {Conditions expected to impact treatment:Diabetes mellitus, Musculoskeletal disorders, and Active major medical illness (Lupus)  If treatment provided at initial evaluation, no treatment charged due to lack of authorization.       Jocelyn CHRISTELLA Bottom, OT 01/28/2024, 10:19 AM   "

## 2024-02-04 ENCOUNTER — Other Ambulatory Visit: Payer: Self-pay | Admitting: Internal Medicine

## 2024-02-04 ENCOUNTER — Ambulatory Visit: Attending: Orthopedic Surgery | Admitting: Occupational Therapy

## 2024-02-04 DIAGNOSIS — R29898 Other symptoms and signs involving the musculoskeletal system: Secondary | ICD-10-CM | POA: Insufficient documentation

## 2024-02-04 DIAGNOSIS — M6281 Muscle weakness (generalized): Secondary | ICD-10-CM | POA: Diagnosis present

## 2024-02-04 DIAGNOSIS — J449 Chronic obstructive pulmonary disease, unspecified: Secondary | ICD-10-CM

## 2024-02-04 DIAGNOSIS — G5602 Carpal tunnel syndrome, left upper limb: Secondary | ICD-10-CM | POA: Diagnosis present

## 2024-02-04 DIAGNOSIS — R208 Other disturbances of skin sensation: Secondary | ICD-10-CM | POA: Diagnosis present

## 2024-02-04 DIAGNOSIS — R278 Other lack of coordination: Secondary | ICD-10-CM | POA: Diagnosis present

## 2024-02-04 DIAGNOSIS — M25532 Pain in left wrist: Secondary | ICD-10-CM | POA: Diagnosis present

## 2024-02-04 DIAGNOSIS — M79642 Pain in left hand: Secondary | ICD-10-CM | POA: Diagnosis present

## 2024-02-04 NOTE — Therapy (Signed)
 " OUTPATIENT OCCUPATIONAL THERAPY ORTHO TREATMENT  Patient Name: Stacy [name] [facility] MRN: [id number] DOB:11-07-1960, 64 y.o., female Today's Date: 02/04/2024  PCP: Vicci Barnie NOVAK, MD REFERRING PROVIDER: Arlinda Buster, MD  END OF SESSION:  OT End of Session - 02/04/24 0848     Visit Number 8    Number of Visits 9    Date for Recertification  02/18/24    Authorization Type [employer]-no auth required; 60 visit limit PT/OT combined, received 14 sessions including eval last POC 04/02/23-06/15/23    OT Start Time 0848    OT Stop Time 0931    OT Time Calculation (min) 43 min    Activity Tolerance Patient tolerated treatment well    Behavior During Therapy WFL for tasks assessed/performed           Past Medical History:  Diagnosis Date   Allergy     Shellfish, cleaning products   Anxiety    Arthritis    Arthrofibrosis of total knee replacement    right   Asthma    COPD (chronic obstructive pulmonary disease) (HCC)    Depression    Diabetes mellitus    Type II   GERD (gastroesophageal reflux disease)    Pt on Protonix  daily   Glaucoma    Gout    Headache(784.0)    otc meds prn   Hyperlipidemia    Hypertension    Irritable bowel syndrome 11/19/2010   Nausea and vomiting 11/30/2023   Neuropathy    Pneumonia YRS AGO   Restless legs    Shortness of breath    07/14/2018- uses  4 times a day   Sleep apnea    Past Surgical History:  Procedure Laterality Date   BACK SURGERY     feb 21, 23   CARPAL TUNNEL RELEASE Right 03/26/2023   Procedure: RIGHT CARPAL TUNNEL RELEASE;  Surgeon: Arlinda Buster, MD;  Location: [facility];  Service: Orthopedics;  Laterality: Right;   CARPOMETACARPEL SUSPENSION PLASTY Right 03/26/2023   Procedure: RIGHT THUMB CARPOMETACARPEL (CMC)  ARTHROPLASTY WITH INTERNAL BRACE;  Surgeon: Arlinda Buster, MD;  Location: [facility];  Service: Orthopedics;  Laterality: Right;   CHOLECYSTECTOMY     COLONOSCOPY      ENDOMETRIAL ABLATION  10/2010   HERNIA REPAIR     umbicial hernia   JOINT REPLACEMENT Left 03/14/2019   Dr. Vernetta hip   KNEE ARTHROSCOPY Left    06/07/2017 Dr. Addie of [facility]   KNEE ARTHROSCOPY Left 03/21/2020   Procedure: LEFT KNEE ARTHROSCOPY WITH PARTIAL MEDIAL MENISCECTOMY;  Surgeon: Vernetta Lonni GRADE, MD;  Location: [facility];  Service: Orthopedics;  Laterality: Left;   KNEE CLOSED REDUCTION Right 12/06/2015   Procedure: CLOSED MANIPULATION RIGHT KNEE;  Surgeon: Lynwood FORBES Better, MD;  Location: [facility] OR;  Service: Orthopedics;  Laterality: Right;   KNEE CLOSED REDUCTION Right 01/17/2016   Procedure: CLOSED MANIPULATION RIGHT KNEE;  Surgeon: Lynwood FORBES Better, MD;  Location: [facility] OR;  Service: Orthopedics;  Laterality: Right;   KNEE JOINT MANIPULATION Right 12/06/2015   LACRIMAL TUBE INSERTION Bilateral 03/01/2019   Procedure: LACRIMAL TUBE INSERTION;  Surgeon: Jacques Sharper, MD;  Location: [facility];  Service: Ophthalmology;  Laterality: Bilateral;   LACRIMAL TUBE REMOVAL Bilateral 05/03/2019   Procedure: BILATERAL NASOLACRIMAL DUCT PROBING, IIRIGATION AND TUBE REMOVAL BOTH EYES;  Surgeon: Jacques Sharper, MD;  Location: [facility];  Service: Ophthalmology;  Laterality: Bilateral;   LUMBAR LAMINECTOMY/DECOMPRESSION MICRODISCECTOMY N/A 06/03/2015  Procedure: Bilateral lateral recess decompression L2-3, L3-4, L4-5;  Surgeon: Lynwood FORBES Better, MD;  Location: [facility];  Service: Orthopedics;  Laterality: N/A;   LUMBAR LAMINECTOMY/DECOMPRESSION MICRODISCECTOMY N/A 10/02/2016   Procedure: Right L5-S1 Lateral Recess Decompression  microdiscectomy;  Surgeon: Better Lynwood FORBES, MD;  Location: [facility] OR;  Service: Orthopedics;  Laterality: N/A;   LUMBAR LAMINECTOMY/DECOMPRESSION MICRODISCECTOMY N/A 07/15/2018   Procedure: LEFT L3-4 MICRODISCECTOMY;  Surgeon: Better Lynwood FORBES, MD;  Location: [facility] OR;  Service: Orthopedics;  Laterality: N/A;   svd      x 2    TEAR DUCT PROBING Bilateral 03/01/2019   Procedure: TEAR DUCT PROBING WITH IRRIGATION;  Surgeon: Jacques Sharper, MD;  Location: [facility];  Service: Ophthalmology;  Laterality: Bilateral;   TOTAL HIP ARTHROPLASTY Left 03/14/2019   Procedure: LEFT TOTAL HIP ARTHROPLASTY ANTERIOR APPROACH;  Surgeon: Vernetta Lonni GRADE, MD;  Location: [facility];  Service: Orthopedics;  Laterality: Left;   TOTAL KNEE ARTHROPLASTY Right 09/06/2015   Procedure: RIGHT TOTAL KNEE ARTHROPLASTY;  Surgeon: Lynwood FORBES Better, MD;  Location: [facility];  Service: Orthopedics;  Laterality: Right;   TOTAL KNEE REVISION Right 05/12/2022   Procedure: RIGHT TOTAL KNEE REVISION ARTHROPLASTY;  Surgeon: Vernetta Lonni GRADE, MD;  Location: [facility] OR;  Service: Orthopedics;  Laterality: Right;   TUBAL LIGATION     UPPER GASTROINTESTINAL ENDOSCOPY  04/28/2011   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 tophus 11/30/2023   Constipation 11/30/2023   Diarrhea 11/30/2023   Diverticular disease of colon 11/30/2023   Enteric campylobacteriosis 11/30/2023   Gastroesophageal reflux disease with esophagitis 11/30/2023   Gastroparesis 11/30/2023   Generalized abdominal pain 11/30/2023   Nausea and vomiting 11/30/2023   Arthritis of carpometacarpal [facility]) joint of right thumb 03/26/2023   Carpal tunnel syndrome, right upper limb 03/26/2023   Status post revision of total replacement of right knee 05/12/2022   History of colonic polyps 03/20/2022   Failed total knee, right, subsequent encounter 03/02/2022   Loose right total knee arthroplasty 03/02/2022   Mixed stress and urge urinary incontinence 12/04/2021   Functional fecal incontinence 12/04/2021   IBS (irritable bowel syndrome) 11/19/2021   Spondylolisthesis, lumbar region    Other spondylosis with radiculopathy, lumbar region    Other secondary scoliosis, lumbar region    Fusion of spine of lumbar region 03/25/2021   Colon polyps  06/06/2020   Lupus 06/06/2020   Allergic rhinitis due to animal (cat) (dog) hair and dander 04/08/2020   Allergic rhinitis due to pollen 04/08/2020   Food allergy  04/08/2020   Acute medial meniscus tear, left, subsequent encounter 03/21/2020   Chronic pain of left knee 02/15/2020   Paresthesia of skin 11/16/2019   History of total knee replacement, right 11/16/2019   Tobacco abuse 11/16/2019   Centrilobular emphysema (HCC) 08/10/2019   Incidental lung nodule, > 3mm and < 8mm 08/10/2019   OSA on CPAP 08/10/2019   Dyspnea on exertion 08/02/2019   Hyperlipidemia 08/02/2019   Lumbar radiculopathy 04/24/2019   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   Post laminectomy syndrome 02/23/2019   Abnormality of gait 02/23/2019   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   Unilateral primary osteoarthritis, left hip 12/28/2018   Lesion of skin of left ear 12/26/2018   Primary osteoarthritis of left hip 12/02/2018   Iron  deficiency anemia 10/16/2018   Chronic pain syndrome 09/08/2018   Chronic pain of right knee 08/11/2018   Status post lumbar laminectomy 07/15/2018  Peripheral arterial disease 04/05/2018   Moderate persistent asthma without complication 06/29/2017   Environmental and seasonal allergies 06/29/2017   Controlled type 2 diabetes mellitus with diabetic polyneuropathy, without long-term current use of insulin  (HCC) 06/29/2017   Perennial allergic rhinitis 04/08/2017   Sensorineural hearing loss (SNHL), bilateral 04/08/2017   Chronic pansinusitis 03/25/2017   Eustachian tube dysfunction, bilateral 03/25/2017   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   Herniation of lumbar intervertebral disc with radiculopathy 10/02/2016    Class: Chronic   Alopecia areata 08/19/2016   Chondromalacia of both patellae 06/03/2015    Class: Chronic   Spinal stenosis, lumbar region, with neurogenic claudication 06/03/2015   Tobacco use disorder 04/25/2015   DJD (degenerative joint disease) of knee 01/04/2015    Hemorrhoid 11/14/2014   Gout of big toe 07/19/2014   Essential hypertension 08/14/2013   Gastroesophageal reflux disease without esophagitis 08/14/2013   COPD (chronic obstructive pulmonary disease) (HCC) 04/17/2011    ONSET DATE: 11/18/2023  REFERRING DIAG: G56.02 (ICD-10-CM) - Carpal tunnel syndrome, left upper limb  Add'l note per Dr. Erwin: Needs OT 2 weeks s/p left CTR on 12/02/23 NO SPLINT  THERAPY DIAG:  Pain in left wrist  Pain in left hand  Other disturbances of skin sensation  Muscle weakness (generalized)  Carpal tunnel syndrome, left upper limb  Other lack of coordination  Other symptoms and signs involving the musculoskeletal system  Rationale for Evaluation and Treatment: Rehabilitation  SUBJECTIVE:   SUBJECTIVE STATEMENT: Pt denies functional limitations with LUE use.  Pt accompanied by: self  PERTINENT HISTORY: DM2 with neuropathy, previous CTR 02/25 on RUE, HLD, OA, R TKA, emphysema, lumbar fusion, IBS, incontinence of urine, lung nodule, lumbar radiculopathy, spinal stenosis, COPD, gout, and  right thumb carpometacarpal arthroplasty with internal bracing  PRECAUTIONS: None  RED FLAGS: None   WEIGHT BEARING RESTRICTIONS: No  PAIN:  Are you having pain? No  FALLS: Has patient fallen in last 6 months? Yes. Number of falls 3, no injuries   LIVING ENVIRONMENT: Lives with: lives alone Lives in: House/apartment Apartment Stairs: Yes: Internal: 13 steps; on left going up and External: 2 steps; on left going up Has following equipment at home: Walker - 4 wheeled and shower chair with a back  PLOF: Independent  PATIENT GOALS: I want to get back to normal and get my hand healed up. I want to build the strength in this hand, and be able to do my daily tasks with this hand.   NEXT MD VISIT: 03/14/24 podiatry  OBJECTIVE:  Note: Objective measures were completed at Evaluation unless otherwise noted.  HAND DOMINANCE:  Left  ADLs: WFL  FUNCTIONAL OUTCOME MEASURES: Quick Dash: 68.2/100  01/14/24:   UPPER EXTREMITY ROM:   N/T at eval  UPPER EXTREMITY MMT:   N/T at eval  HAND FUNCTION: Grip strength: Right: 61 lbs; Left: NT lbs 01/14/24: 56.2 avg R hand (61.9, 52.6, 54.2)  60 avg L hand (59, 58.4, 62.6)  COORDINATION: (pt's nails were long and untrimmed at time of testing) 9 Hole Peg test: Right: 41.46 sec; Left: 33.50 sec Box and Blocks:  Right 49 blocks, Left 54 blocks  SENSATION: WFL  EDEMA: Pt reports it was swollen before, but is elevating it whenever possible.   COGNITION: Overall cognitive status: Within functional limits for tasks assessed   OBSERVATIONS: Pain in L hand and wrist, healing surgical area, affecting ability to complete ADL/IADL  TREATMENT:                             -  Self-care/home management completed for duration as noted below including:  OT reviewed scar management recommendations including use of kinesio tape. Pt instructed in wear and care of taping and scar tissue application applied at end of session.                                                                                                  - Manual therapy completed for duration as noted below including:  Therapist completed IASTM using tissue movers tools and edge mobility tool at site of incision using deep prep lotion as emollient for myofascial restrictions, reduction of scar tissue, improve soft tissue mobility, and to enhance functional range of motion of affected extremity. Improved appearance to incision noted upon completion with less palpable adhesions. Also utilized tabbed application and manipulation technique using kinesio tape for improved scar tissue mobilization. - Ultrasound completed for duration as noted below including:  Ultrasound applied to palmar left hand, digits, and wrist for 8 minutes, frequency of 3 MHz, 20% duty cycle, and 1.1 W/cm with pt's arm placed on soft towel for  promotion of ROM, edema reduction, and pain reduction in affected extremity.   PATIENT EDUCATION: Education details: SEE ABOVE Person educated: Patient Education method: Explanation, Demonstration, and Verbal cues Education comprehension: verbalized understanding, returned demonstration, verbal cues required, and needs further education  HOME EXERCISE PROGRAM: 12/17/23: IHP HEP s/p CTR, sleep positioning 12/24/23: joint protection, energy conservation 01/06/2024: heat; scar massage  01/20/2024: Access Code: [email or web address] URL: [email or web address]  Exercises - Isometric Wrist Extension Pronated  - 1 x daily - 5 x weekly - 3 sets - 5-10 reps - 3-5 hold - Seated Isometric Wrist Flexion Supinated with Manual Resistance  - 1 x daily - 5 x weekly - 3 sets - 5-10 reps - 3-5 hold - Seated Isometric Wrist Ulnar Deviation with Manual Resistance  - 1 x daily - 5 x weekly - 3 sets - 5-10 reps - 3-5 hold - Seated Isometric Wrist Radial Deviation with Manual Resistance  - 1 x daily - 5 x weekly - 3 sets - 5-10 reps - 3-5 hold - Seated Isometric Forearm Supination  - 1 x daily - 5 x weekly - 3 sets - 5-10 reps - 3-5 hold - Isometric Wrist Pronation  - 1 x daily - 7 x weekly - 3 sets - 5-10 reps - 3-5 hold  GOALS: Goals reviewed with patient? Yes  SHORT TERM GOALS: Target date: 01/30/24  Pt will be independent with HEP for wrist strengthening and hand strengthening Baseline: New to OP OT Goal status: IN PROGRESS  2.  Pt will be independent with scar tissue massage for L hand/wrist Baseline: New to OP OT Goal status: MET  3.  Pt will verbalize at least 2 joint protection strategies to reduce re-inflammation of L wrist and hand Baseline: New to OP OT 01/14/24: Pt required re-education Goal status:IN PROGRESS  4.  Pt will report reduced pain in ADL completion to no more than 2/10  Baseline: 6/10 01/14/24: 5/10 Goal status: MET    LONG TERM GOALS: Target date: 02/18/24  Patient  will demonstrate at least 25% improvement with quick Dash score (reporting 43.2% disability or less) indicating improved functional use of affected extremity.  Baseline: Quick Dash: 68.2/100 01/14/24: 45.5/100 Goal status: IN PROGRESS  2.  OT will assess grip strength of L hand and make goal as appropriate. Baseline: New to OP OT Goal status: DISCONTINUE, NOT NEEDED  3.  Pt will be educated in AE/compnsatory strategies to promote ease and reduced pain in ADL/IADL completion Baseline: New to OP OT Goal status: IN PROGRESS  ASSESSMENT:  CLINICAL IMPRESSION: Patient presents to occupational therapy s/p L CTR with history of R CTR and thumb carpometacarpal arthroplasty with internal bracing (03/26/2023). Good tolerance to scar management this date. Pt should be appropriate for d/c at next session per POC.   PERFORMANCE DEFICITS: in functional skills including ADLs, IADLs, strength, pain, and UE functional use, cognitive skills including safety awareness, and psychosocial skills including coping strategies and environmental adaptation.   IMPAIRMENTS: are limiting patient from ADLs, IADLs, rest and sleep, and leisure.   COMORBIDITIES: may have co-morbidities  that affects occupational performance. Patient will benefit from skilled OT to address above impairments and improve overall function.  REHAB POTENTIAL: Good  PLAN:  OT FREQUENCY: 1x/week  OT DURATION: 8 weeks  PLANNED INTERVENTIONS: 97168 OT Re-evaluation, 97535 self care/ADL training, 02889 therapeutic exercise, 97530 therapeutic activity, 97140 manual therapy, 97035 ultrasound, 97018 paraffin, 02960 fluidotherapy, 97010 moist heat, 97010 cryotherapy, scar mobilization, passive range of motion, energy conservation, coping strategies training, patient/family education, and DME and/or AE instructions  RECOMMENDED OTHER SERVICES: none  CONSULTED AND AGREED WITH PLAN OF CARE: Patient  PLAN FOR NEXT SESSION: D/C  DOS 12/02/23-DOT  02/04/24: 9 weeks post surgery this date  For all possible CPT codes, reference the Planned Interventions line above.     Check all conditions that are expected to impact treatment: {Conditions expected to impact treatment:Diabetes mellitus, Musculoskeletal disorders, and Active major medical illness (Lupus)  If treatment provided at initial evaluation, no treatment charged due to lack of authorization.       Jocelyn CHRISTELLA Bottom, OT 02/04/2024, 2:35 PM   "

## 2024-02-07 ENCOUNTER — Other Ambulatory Visit: Payer: Self-pay | Admitting: Internal Medicine

## 2024-02-07 DIAGNOSIS — Z1231 Encounter for screening mammogram for malignant neoplasm of breast: Secondary | ICD-10-CM

## 2024-02-10 ENCOUNTER — Ambulatory Visit

## 2024-02-10 DIAGNOSIS — R278 Other lack of coordination: Secondary | ICD-10-CM

## 2024-02-10 DIAGNOSIS — M79642 Pain in left hand: Secondary | ICD-10-CM

## 2024-02-10 DIAGNOSIS — G5602 Carpal tunnel syndrome, left upper limb: Secondary | ICD-10-CM

## 2024-02-10 DIAGNOSIS — M25532 Pain in left wrist: Secondary | ICD-10-CM | POA: Diagnosis not present

## 2024-02-10 NOTE — Therapy (Signed)
 " OUTPATIENT OCCUPATIONAL THERAPY ORTHO TREATMENT AND DISCHARGE  Patient Name: [name] MRN: [id number] DOB:[birth year], 64 y.o., female Today's Date: 02/10/2024  PCP: Vicci Barnie NOVAK, MD REFERRING PROVIDER: Arlinda Buster, MD  OCCUPATIONAL THERAPY DISCHARGE SUMMARY  Visits from Start of Care: Pt received 9 visits including eval from 12/17/23-02/10/24.  Current functional level related to goals / functional outcomes: Pt met all 4/4 STG and 2/2 LTG (add'l LTG was made but discontinued d/t not needing it)   Remaining deficits: None   Education / Equipment: Joint protection strategies, AE for reducing strain off of L wrist/hand, HEPs for improving strength, ROM, and coordination, modality usage for pain relief   Patient agrees to discharge. Patient goals were met. Patient is being discharged due to meeting the stated rehab goals..     END OF SESSION:  OT End of Session - 02/10/24 0846     Visit Number 9    Number of Visits 9    Date for Recertification  02/18/24    Authorization Type [id number]    OT Start Time 0845    OT Stop Time 0923    OT Time Calculation (min) 38 min    Equipment Utilized During Treatment fluido    Activity Tolerance Patient tolerated treatment well    Behavior During Therapy WFL for tasks assessed/performed           Past Medical History:  Diagnosis Date   Allergy     Shellfish, cleaning products   Anxiety    Arthritis    Arthrofibrosis of total knee replacement    right   Asthma    COPD (chronic obstructive pulmonary disease) (HCC)    Depression    Diabetes mellitus    Type II   GERD (gastroesophageal reflux disease)    Pt on Protonix  daily   Glaucoma    Gout    Headache(784.0)    otc meds prn   Hyperlipidemia    Hypertension    Irritable bowel syndrome 11/19/2010   Nausea and vomiting 11/30/2023   Neuropathy    Pneumonia YRS AGO   Restless legs    Shortness of breath    07/14/2018- uses  4 times a day   Sleep  apnea    Past Surgical History:  Procedure Laterality Date   BACK SURGERY     feb 21, 23   CARPAL TUNNEL RELEASE Right 03/26/2023   Procedure: RIGHT CARPAL TUNNEL RELEASE;  Surgeon: Arlinda Buster, MD;  Location: [facility];  Service: Orthopedics;  Laterality: Right;   CARPOMETACARPEL SUSPENSION PLASTY Right 03/26/2023   Procedure: RIGHT THUMB CARPOMETACARPEL (CMC)  ARTHROPLASTY WITH INTERNAL BRACE;  Surgeon: Arlinda Buster, MD;  Location: [facility];  Service: Orthopedics;  Laterality: Right;   CHOLECYSTECTOMY     COLONOSCOPY     ENDOMETRIAL ABLATION  10/2010   HERNIA REPAIR     umbicial hernia   JOINT REPLACEMENT Left 03/14/2019   Dr. Vernetta hip   KNEE ARTHROSCOPY Left    06/07/2017 Dr. Addie of [facility]   KNEE ARTHROSCOPY Left 03/21/2020   Procedure: LEFT KNEE ARTHROSCOPY WITH PARTIAL MEDIAL MENISCECTOMY;  Surgeon: Vernetta Lonni GRADE, MD;  Location:  SURGERY CENTER;  Service: Orthopedics;  Laterality: Left;   KNEE CLOSED REDUCTION Right 12/06/2015   Procedure: CLOSED MANIPULATION RIGHT KNEE;  Surgeon: Lynwood FORBES Better, MD;  Location: [facility] OR;  Service: Orthopedics;  Laterality: Right;   KNEE CLOSED REDUCTION Right 01/17/2016   Procedure: CLOSED MANIPULATION RIGHT KNEE;  Surgeon: Lynwood FORBES Better, MD;  Location: [facility] OR;  Service: Orthopedics;  Laterality: Right;   KNEE JOINT MANIPULATION Right 12/06/2015   LACRIMAL TUBE INSERTION Bilateral 03/01/2019   Procedure: LACRIMAL TUBE INSERTION;  Surgeon: Jacques Sharper, MD;  Location: [facility];  Service: Ophthalmology;  Laterality: Bilateral;   LACRIMAL TUBE REMOVAL Bilateral 05/03/2019   Procedure: BILATERAL NASOLACRIMAL DUCT PROBING, IIRIGATION AND TUBE REMOVAL BOTH EYES;  Surgeon: Jacques Sharper, MD;  Location: [facility];  Service: Ophthalmology;  Laterality: Bilateral;   LUMBAR LAMINECTOMY/DECOMPRESSION MICRODISCECTOMY N/A 06/03/2015   Procedure: Bilateral  lateral recess decompression L2-3, L3-4, L4-5;  Surgeon: Lynwood FORBES Better, MD;  Location: [facility] OR;  Service: Orthopedics;  Laterality: N/A;   LUMBAR LAMINECTOMY/DECOMPRESSION MICRODISCECTOMY N/A 10/02/2016   Procedure: Right L5-S1 Lateral Recess Decompression  microdiscectomy;  Surgeon: Better Lynwood FORBES, MD;  Location: [facility] OR;  Service: Orthopedics;  Laterality: N/A;   LUMBAR LAMINECTOMY/DECOMPRESSION MICRODISCECTOMY N/A 07/15/2018   Procedure: LEFT L3-4 MICRODISCECTOMY;  Surgeon: Better Lynwood FORBES, MD;  Location: [facility] OR;  Service: Orthopedics;  Laterality: N/A;   svd      x 2   TEAR DUCT PROBING Bilateral 03/01/2019   Procedure: TEAR DUCT PROBING WITH IRRIGATION;  Surgeon: Jacques Sharper, MD;  Location: [facility];  Service: Ophthalmology;  Laterality: Bilateral;   TOTAL HIP ARTHROPLASTY Left 03/14/2019   Procedure: LEFT TOTAL HIP ARTHROPLASTY ANTERIOR APPROACH;  Surgeon: Vernetta Lonni GRADE, MD;  Location: [facility];  Service: Orthopedics;  Laterality: Left;   TOTAL KNEE ARTHROPLASTY Right 09/06/2015   Procedure: RIGHT TOTAL KNEE ARTHROPLASTY;  Surgeon: Lynwood FORBES Better, MD;  Location: [facility];  Service: Orthopedics;  Laterality: Right;   TOTAL KNEE REVISION Right 05/12/2022   Procedure: RIGHT TOTAL KNEE REVISION ARTHROPLASTY;  Surgeon: Vernetta Lonni GRADE, MD;  Location: [facility] OR;  Service: Orthopedics;  Laterality: Right;   TUBAL LIGATION     UPPER GASTROINTESTINAL ENDOSCOPY  04/28/2011   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 tophus 11/30/2023   Constipation 11/30/2023   Diarrhea 11/30/2023   Diverticular disease of colon 11/30/2023   Enteric campylobacteriosis 11/30/2023   Gastroesophageal reflux disease with esophagitis 11/30/2023   Gastroparesis 11/30/2023   Generalized abdominal pain 11/30/2023   Nausea and vomiting 11/30/2023   Arthritis of carpometacarpal [facility]) joint of right thumb 03/26/2023   Carpal tunnel syndrome, right upper limb  03/26/2023   Status post revision of total replacement of right knee 05/12/2022   History of colonic polyps 03/20/2022   Failed total knee, right, subsequent encounter 03/02/2022   Loose right total knee arthroplasty 03/02/2022   Mixed stress and urge urinary incontinence 12/04/2021   Functional fecal incontinence 12/04/2021   IBS (irritable bowel syndrome) 11/19/2021   Spondylolisthesis, lumbar region    Other spondylosis with radiculopathy, lumbar region    Other secondary scoliosis, lumbar region    Fusion of spine of lumbar region 03/25/2021   Colon polyps 06/06/2020   Lupus 06/06/2020   Allergic rhinitis due to animal (cat) (dog) hair and dander 04/08/2020   Allergic rhinitis due to pollen 04/08/2020   Food allergy  04/08/2020   Acute medial meniscus tear, left, subsequent encounter 03/21/2020   Chronic pain of left knee 02/15/2020   Paresthesia of skin 11/16/2019   History of total knee replacement, right 11/16/2019   Tobacco abuse 11/16/2019   Centrilobular emphysema (HCC) 08/10/2019   Incidental lung nodule, > 3mm and < 8mm 08/10/2019   OSA on  CPAP 08/10/2019   Dyspnea on exertion 08/02/2019   Hyperlipidemia 08/02/2019   Lumbar radiculopathy 04/24/2019   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   Post laminectomy syndrome 02/23/2019   Abnormality of gait 02/23/2019   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   Unilateral primary osteoarthritis, left hip 12/28/2018   Lesion of skin of left ear 12/26/2018   Primary osteoarthritis of left hip 12/02/2018   Iron  deficiency anemia 10/16/2018   Chronic pain syndrome 09/08/2018   Chronic pain of right knee 08/11/2018   Status post lumbar laminectomy 07/15/2018   Peripheral arterial disease 04/05/2018   Moderate persistent asthma without complication 06/29/2017   Environmental and seasonal allergies 06/29/2017   Controlled type 2 diabetes mellitus with diabetic polyneuropathy, without long-term current use of insulin  (HCC)  06/29/2017   Perennial allergic rhinitis 04/08/2017   Sensorineural hearing loss (SNHL), bilateral 04/08/2017   Chronic pansinusitis 03/25/2017   Eustachian tube dysfunction, bilateral 03/25/2017   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   Herniation of lumbar intervertebral disc with radiculopathy 10/02/2016    Class: Chronic   Alopecia areata 08/19/2016   Chondromalacia of both patellae 06/03/2015    Class: Chronic   Spinal stenosis, lumbar region, with neurogenic claudication 06/03/2015   Tobacco use disorder 04/25/2015   DJD (degenerative joint disease) of knee 01/04/2015   Hemorrhoid 11/14/2014   Gout of big toe 07/19/2014   Essential hypertension 08/14/2013   Gastroesophageal reflux disease without esophagitis 08/14/2013   COPD (chronic obstructive pulmonary disease) (HCC) 04/17/2011    ONSET DATE: 11/18/2023  REFERRING DIAG: G56.02 (ICD-10-CM) - Carpal tunnel syndrome, left upper limb  Add'l note per Dr. Erwin: Needs OT 2 weeks s/p left CTR on 12/02/23 NO SPLINT  THERAPY DIAG:  Pain in left wrist  Pain in left hand  Carpal tunnel syndrome, left upper limb  Other lack of coordination  Rationale for Evaluation and Treatment: Rehabilitation  SUBJECTIVE:   SUBJECTIVE STATEMENT: Pt denies functional limitations with LUE use.  Pt accompanied by: self  PERTINENT HISTORY: DM2 with neuropathy, previous CTR 02/25 on RUE, HLD, OA, R TKA, emphysema, lumbar fusion, IBS, incontinence of urine, lung nodule, lumbar radiculopathy, spinal stenosis, COPD, gout, and  right thumb carpometacarpal arthroplasty with internal bracing  PRECAUTIONS: None  RED FLAGS: None   WEIGHT BEARING RESTRICTIONS: No  PAIN:  Are you having pain? No  FALLS: Has patient fallen in last 6 months? Yes. Number of falls 3, no injuries   LIVING ENVIRONMENT: Lives with: lives alone Lives in: House/apartment Apartment Stairs: Yes: Internal: 13 steps; on left going up and External: 2 steps; on left going  up Has following equipment at home: Walker - 4 wheeled and shower chair with a back  PLOF: Independent  PATIENT GOALS: I want to get back to normal and get my hand healed up. I want to build the strength in this hand, and be able to do my daily tasks with this hand.   NEXT MD VISIT: 03/14/24 podiatry  OBJECTIVE:  Note: Objective measures were completed at Evaluation unless otherwise noted.  HAND DOMINANCE: Left  ADLs: [facility]  FUNCTIONAL OUTCOME MEASURES: Quick Dash: 68.2/100  01/14/24:  02/10/24 UPPER EXTREMITY ROM:   N/T at eval  UPPER EXTREMITY MMT:   N/T at eval  HAND FUNCTION: Grip strength: Right: 61 lbs; Left: NT lbs 01/14/24: 56.2 avg R hand (61.9, 52.6, 54.2)  60 avg L hand (59, 58.4, 62.6)  COORDINATION: (pt's nails were long and untrimmed at time of testing) 9 Hole Peg  test: Right: 41.46 sec; Left: 33.50 sec Box and Blocks:  Right 49 blocks, Left 54 blocks  SENSATION: WFL  EDEMA: Pt reports it was swollen before, but is elevating it whenever possible.   COGNITION: Overall cognitive status: Within functional limits for tasks assessed   OBSERVATIONS: Pain in L hand and wrist, healing surgical area, affecting ability to complete ADL/IADL  TREATMENT:                             - Self-care/home management completed for duration as noted below including:  Took assessments for remaining goals, and informed pt of progress. Reviewed joint protection strategies to help reduce strain off of L hand.   - Manual therapy completed for duration as noted below including: Therapist completed IASTM using edge mobility tool at site of incision using deep prep lotion as emollient for myofascial restrictions, reduction of scar tissue, improve soft tissue mobility, and to enhance functional range of motion of affected extremity. Improved appearance to incision noted upon completion with less palpable adhesions. Applied long thin piece of kinesiotape to scar area for further mobility  of scar tissue with approxiamtely 50-60% pull. Reviewed with pt skin integrity principles in regards to kinesiotape (leave on no more than 3 days each application, allow skin to breathe at least 1 day between applications).   - Therapeutic exercises completed for duration as noted below including:   Pt placed LUE in Fluidotherapy machine with supervised ROM x 10 min. Pt was educated to complete hand ROM and tendon glides during modality time to improve ROM and decrease pain/stiffness of affected extremity by use of the machine's massaging action and thermal properties.    PATIENT EDUCATION: Education details: SEE ABOVE Person educated: Patient Education method: Explanation, Demonstration, and Verbal cues Education comprehension: verbalized understanding, returned demonstration, verbal cues required, and needs further education  HOME EXERCISE PROGRAM: 12/17/23: IHP HEP s/p CTR, sleep positioning 12/24/23: joint protection, energy conservation 01/06/2024: heat; scar massage  01/20/2024: Access Code: [email or web address] URL: [email or web address]  Exercises - Isometric Wrist Extension Pronated  - 1 x daily - 5 x weekly - 3 sets - 5-10 reps - 3-5 hold - Seated Isometric Wrist Flexion Supinated with Manual Resistance  - 1 x daily - 5 x weekly - 3 sets - 5-10 reps - 3-5 hold - Seated Isometric Wrist Ulnar Deviation with Manual Resistance  - 1 x daily - 5 x weekly - 3 sets - 5-10 reps - 3-5 hold - Seated Isometric Wrist Radial Deviation with Manual Resistance  - 1 x daily - 5 x weekly - 3 sets - 5-10 reps - 3-5 hold - Seated Isometric Forearm Supination  - 1 x daily - 5 x weekly - 3 sets - 5-10 reps - 3-5 hold - Isometric Wrist Pronation  - 1 x daily - 7 x weekly - 3 sets - 5-10 reps - 3-5 hold  GOALS: Goals reviewed with patient? Yes  SHORT TERM GOALS: Target date: 01/30/24  Pt will be independent with HEP for wrist strengthening and hand strengthening Baseline: New to OP OT Goal  status: MET  2.  Pt will be independent with scar tissue massage for L hand/wrist Baseline: New to OP OT Goal status: MET  3.  Pt will verbalize at least 2 joint protection strategies to reduce re-inflammation of L wrist and hand Baseline: New to OP OT 01/14/24: Pt required re-education Goal status: MET   4.  Pt will  report reduced pain in ADL completion to no more than 2/10  Baseline: 6/10 01/14/24: 5/10 Goal status: MET    LONG TERM GOALS: Target date: 02/18/24  Patient will demonstrate at least 25% improvement with quick Dash score (reporting 43.2% disability or less) indicating improved functional use of affected extremity.  Baseline: Quick Dash: 68.2/100 01/14/24: 45.5/100 02/10/24: 27.3/100 Goal status: MET  2.  OT will assess grip strength of L hand and make goal as appropriate. Baseline: New to OP OT Goal status: DISCONTINUE, NOT NEEDED  3.  Pt will be educated in AE/compnsatory strategies to promote ease and reduced pain in ADL/IADL completion Baseline: New to OP OT Goal status: MET  ASSESSMENT:  CLINICAL IMPRESSION: Patient presents to occupational therapy s/p L CTR with history of R CTR and thumb carpometacarpal arthroplasty with internal bracing (03/26/2023). Patient is appropriate for discharge and no longer demonstrates medical necessity for continued skilled occupational therapy services.        Rocky Dutch, OT 02/10/2024, 8:58 AM   "

## 2024-03-06 ENCOUNTER — Ambulatory Visit

## 2024-03-08 ENCOUNTER — Other Ambulatory Visit: Payer: Self-pay | Admitting: Internal Medicine

## 2024-03-08 ENCOUNTER — Telehealth: Payer: Self-pay | Admitting: *Deleted

## 2024-03-14 ENCOUNTER — Ambulatory Visit: Admitting: Podiatry

## 2024-03-21 ENCOUNTER — Ambulatory Visit

## 2024-04-19 ENCOUNTER — Telehealth: Payer: Self-pay | Admitting: *Deleted

## 2024-05-15 ENCOUNTER — Ambulatory Visit: Payer: Self-pay | Admitting: Internal Medicine
